# Patient Record
Sex: Female | Born: 1970
Health system: Southern US, Community
[De-identification: ages and names within clinical notes are randomized; demographics above are authoritative.]

## PROBLEM LIST (undated history)

## (undated) DIAGNOSIS — E039 Hypothyroidism, unspecified: Secondary | ICD-10-CM

## (undated) DIAGNOSIS — T7840XA Allergy, unspecified, initial encounter: Secondary | ICD-10-CM

## (undated) DIAGNOSIS — F319 Bipolar disorder, unspecified: Secondary | ICD-10-CM

## (undated) DIAGNOSIS — F419 Anxiety disorder, unspecified: Secondary | ICD-10-CM

## (undated) DIAGNOSIS — E1129 Type 2 diabetes mellitus with other diabetic kidney complication: Secondary | ICD-10-CM

## (undated) DIAGNOSIS — Z800 Family history of malignant neoplasm of digestive organs: Secondary | ICD-10-CM

## (undated) DIAGNOSIS — E785 Hyperlipidemia, unspecified: Secondary | ICD-10-CM

## (undated) DIAGNOSIS — C801 Malignant (primary) neoplasm, unspecified: Secondary | ICD-10-CM

## (undated) DIAGNOSIS — F410 Panic disorder [episodic paroxysmal anxiety] without agoraphobia: Secondary | ICD-10-CM

## (undated) DIAGNOSIS — F329 Major depressive disorder, single episode, unspecified: Secondary | ICD-10-CM

## (undated) DIAGNOSIS — I10 Essential (primary) hypertension: Secondary | ICD-10-CM

## (undated) DIAGNOSIS — Z803 Family history of malignant neoplasm of breast: Secondary | ICD-10-CM

## (undated) DIAGNOSIS — Z87442 Personal history of urinary calculi: Secondary | ICD-10-CM

## (undated) DIAGNOSIS — Z8049 Family history of malignant neoplasm of other genital organs: Secondary | ICD-10-CM

## (undated) DIAGNOSIS — N189 Chronic kidney disease, unspecified: Secondary | ICD-10-CM

## (undated) DIAGNOSIS — F32A Depression, unspecified: Secondary | ICD-10-CM

## (undated) DIAGNOSIS — M1990 Unspecified osteoarthritis, unspecified site: Secondary | ICD-10-CM

## (undated) DIAGNOSIS — K219 Gastro-esophageal reflux disease without esophagitis: Secondary | ICD-10-CM

## (undated) DIAGNOSIS — J449 Chronic obstructive pulmonary disease, unspecified: Secondary | ICD-10-CM

## (undated) DIAGNOSIS — J189 Pneumonia, unspecified organism: Secondary | ICD-10-CM

## (undated) DIAGNOSIS — L409 Psoriasis, unspecified: Secondary | ICD-10-CM

## (undated) DIAGNOSIS — Z8489 Family history of other specified conditions: Secondary | ICD-10-CM

## (undated) HISTORY — DX: Essential (primary) hypertension: I10

## (undated) HISTORY — PX: MOUTH SURGERY: SHX715

## (undated) HISTORY — DX: Family history of malignant neoplasm of breast: Z80.3

## (undated) HISTORY — DX: Chronic kidney disease, unspecified: N18.9

## (undated) HISTORY — DX: Allergy, unspecified, initial encounter: T78.40XA

## (undated) HISTORY — DX: Hyperlipidemia, unspecified: E78.5

## (undated) HISTORY — DX: Major depressive disorder, single episode, unspecified: F32.9

## (undated) HISTORY — DX: Anxiety disorder, unspecified: F41.9

## (undated) HISTORY — DX: Family history of malignant neoplasm of other genital organs: Z80.49

## (undated) HISTORY — DX: Family history of malignant neoplasm of digestive organs: Z80.0

## (undated) HISTORY — DX: Gastro-esophageal reflux disease without esophagitis: K21.9

## (undated) HISTORY — DX: Depression, unspecified: F32.A

## (undated) MED FILL — Dexamethasone Sodium Phosphate Inj 100 MG/10ML: INTRAMUSCULAR | Qty: 1 | Status: AC

---
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
## 2004-12-06 ENCOUNTER — Ambulatory Visit: Payer: Self-pay | Admitting: Obstetrics and Gynecology

## 2004-12-25 ENCOUNTER — Ambulatory Visit: Payer: Self-pay | Admitting: Obstetrics and Gynecology

## 2005-01-25 ENCOUNTER — Ambulatory Visit: Payer: Self-pay | Admitting: Obstetrics and Gynecology

## 2005-06-19 ENCOUNTER — Inpatient Hospital Stay: Payer: Self-pay | Admitting: Obstetrics and Gynecology

## 2006-09-22 ENCOUNTER — Emergency Department: Payer: Self-pay | Admitting: Unknown Physician Specialty

## 2006-09-25 ENCOUNTER — Other Ambulatory Visit: Payer: Self-pay

## 2006-09-25 ENCOUNTER — Emergency Department: Payer: Self-pay | Admitting: General Practice

## 2007-01-24 ENCOUNTER — Emergency Department: Payer: Self-pay | Admitting: Internal Medicine

## 2009-12-31 ENCOUNTER — Inpatient Hospital Stay: Payer: Self-pay | Admitting: Psychiatry

## 2010-05-25 ENCOUNTER — Inpatient Hospital Stay: Payer: Self-pay | Admitting: Psychiatry

## 2011-05-21 ENCOUNTER — Inpatient Hospital Stay: Payer: Self-pay | Admitting: Psychiatry

## 2011-09-18 ENCOUNTER — Other Ambulatory Visit: Payer: Self-pay | Admitting: Psychiatry

## 2012-01-04 ENCOUNTER — Inpatient Hospital Stay: Payer: Self-pay | Admitting: Psychiatry

## 2012-01-04 LAB — TSH: Thyroid Stimulating Horm: 2.22 u[IU]/mL

## 2012-01-04 LAB — COMPREHENSIVE METABOLIC PANEL
Albumin: 4.5 g/dL (ref 3.4–5.0)
Alkaline Phosphatase: 89 U/L (ref 50–136)
Anion Gap: 10 (ref 7–16)
BUN: 16 mg/dL (ref 7–18)
Bilirubin,Total: 0.4 mg/dL (ref 0.2–1.0)
Calcium, Total: 10 mg/dL (ref 8.5–10.1)
Creatinine: 1.05 mg/dL (ref 0.60–1.30)
EGFR (Non-African Amer.): >60
Glucose: 92 mg/dL (ref 65–99)
Potassium: 3.4 mmol/L — ABNORMAL LOW (ref 3.5–5.1)
SGOT(AST): 19 U/L (ref 15–37)
SGPT (ALT): 16 U/L
Sodium: 135 mmol/L — ABNORMAL LOW (ref 136–145)
Total Protein: 8.4 g/dL — ABNORMAL HIGH (ref 6.4–8.2)

## 2012-01-04 LAB — DRUG SCREEN, URINE
Amphetamines, Ur Screen: NEGATIVE (ref ?–1000)
Benzodiazepine, Ur Scrn: NEGATIVE (ref ?–200)
Cannabinoid 50 Ng, Ur ~~LOC~~: NEGATIVE (ref ?–50)
MDMA (Ecstasy)Ur Screen: NEGATIVE (ref ?–500)
Methadone, Ur Screen: NEGATIVE (ref ?–300)

## 2012-01-04 LAB — URINALYSIS, COMPLETE
Bilirubin,UR: NEGATIVE
Glucose,UR: NEGATIVE mg/dL (ref 0–75)
Leukocyte Esterase: NEGATIVE
Nitrite: NEGATIVE
Ph: 6 (ref 4.5–8.0)
RBC,UR: 2016 /HPF (ref 0–5)
Squamous Epithelial: 3

## 2012-01-04 LAB — CBC
HCT: 46.3 % (ref 35.0–47.0)
HGB: 15.8 g/dL (ref 12.0–16.0)
MCHC: 34.2 g/dL (ref 32.0–36.0)
RBC: 4.77 10*6/uL (ref 3.80–5.20)

## 2012-01-06 LAB — BASIC METABOLIC PANEL
Anion Gap: 8 (ref 7–16)
BUN: 15 mg/dL (ref 7–18)
Calcium, Total: 10 mg/dL (ref 8.5–10.1)
Creatinine: 0.92 mg/dL (ref 0.60–1.30)
EGFR (African American): >60
EGFR (Non-African Amer.): >60
Glucose: 115 mg/dL — ABNORMAL HIGH (ref 65–99)
Osmolality: 272 (ref 275–301)
Sodium: 135 mmol/L — ABNORMAL LOW (ref 136–145)

## 2012-01-06 LAB — CBC WITH DIFFERENTIAL/PLATELET
Basophil #: 0.1 10*3/uL (ref 0.0–0.1)
Basophil %: 0.5 %
Eosinophil %: 1.9 %
HCT: 49.1 % — ABNORMAL HIGH (ref 35.0–47.0)
Lymphocyte #: 3.8 10*3/uL — ABNORMAL HIGH (ref 1.0–3.6)
Lymphocyte %: 33.9 %
MCV: 98 fL (ref 80–100)
Monocyte %: 7.3 %
Neutrophil %: 56.4 %
Platelet: 209 10*3/uL (ref 150–440)
RDW: 12.9 % (ref 11.5–14.5)
WBC: 11.3 10*3/uL — ABNORMAL HIGH (ref 3.6–11.0)

## 2012-01-06 LAB — URINALYSIS, COMPLETE
Bilirubin,UR: NEGATIVE
Glucose,UR: NEGATIVE mg/dL (ref 0–75)
Ketone: NEGATIVE
Nitrite: NEGATIVE
RBC,UR: 2652 /HPF (ref 0–5)
Specific Gravity: 1.01 (ref 1.003–1.030)
Squamous Epithelial: 8
WBC UR: 42 /HPF (ref 0–5)

## 2012-01-06 LAB — LITHIUM LEVEL: Lithium: 0.4 mmol/L — ABNORMAL LOW

## 2012-01-08 LAB — URINALYSIS, COMPLETE
Bacteria: NONE SEEN
Bilirubin,UR: NEGATIVE
Glucose,UR: NEGATIVE mg/dL (ref 0–75)
Ketone: NEGATIVE
Ph: 7 (ref 4.5–8.0)
Protein: NEGATIVE
Specific Gravity: 1.005 (ref 1.003–1.030)
WBC UR: 1 /HPF (ref 0–5)

## 2012-01-10 LAB — LITHIUM LEVEL: Lithium: 0.46 mmol/L — ABNORMAL LOW

## 2012-01-17 ENCOUNTER — Inpatient Hospital Stay: Payer: Self-pay | Admitting: Psychiatry

## 2012-01-17 LAB — BEHAVIORAL MEDICINE 1 PANEL
Alkaline Phosphatase: 78 U/L (ref 50–136)
Anion Gap: 9 (ref 7–16)
BUN: 9 mg/dL (ref 7–18)
Basophil #: 0.1 10*3/uL (ref 0.0–0.1)
Bilirubin,Total: 0.2 mg/dL (ref 0.2–1.0)
Chloride: 104 mmol/L (ref 98–107)
Co2: 28 mmol/L (ref 21–32)
Creatinine: 0.89 mg/dL (ref 0.60–1.30)
EGFR (African American): >60
EGFR (Non-African Amer.): >60
Eosinophil #: 0.3 10*3/uL (ref 0.0–0.7)
HCT: 41.8 % (ref 35.0–47.0)
HGB: 14.4 g/dL (ref 12.0–16.0)
Lymphocyte #: 5.3 10*3/uL — ABNORMAL HIGH (ref 1.0–3.6)
Lymphocyte %: 33.5 %
MCH: 33.3 pg (ref 26.0–34.0)
MCHC: 34.6 g/dL (ref 32.0–36.0)
Monocyte #: 0.8 10*3/uL — ABNORMAL HIGH (ref 0.0–0.7)
Neutrophil %: 58.8 %
Osmolality: 280 (ref 275–301)
Platelet: 237 10*3/uL (ref 150–440)
SGOT(AST): 16 U/L (ref 15–37)
SGPT (ALT): 17 U/L
Sodium: 141 mmol/L (ref 136–145)
Thyroid Stimulating Horm: 1.73 u[IU]/mL
Total Protein: 7.2 g/dL (ref 6.4–8.2)

## 2012-01-17 LAB — LITHIUM LEVEL: Lithium: 0.83 mmol/L

## 2012-01-18 LAB — URINALYSIS, COMPLETE
Glucose,UR: NEGATIVE mg/dL (ref 0–75)
Ketone: NEGATIVE
Leukocyte Esterase: NEGATIVE
Nitrite: NEGATIVE
Ph: 7 (ref 4.5–8.0)
Protein: NEGATIVE
RBC,UR: 12 /HPF (ref 0–5)
Squamous Epithelial: NONE SEEN
WBC UR: 1 /HPF (ref 0–5)

## 2012-04-15 ENCOUNTER — Inpatient Hospital Stay: Payer: Self-pay | Admitting: Psychiatry

## 2012-04-15 LAB — URINALYSIS, COMPLETE
Glucose,UR: NEGATIVE mg/dL (ref 0–75)
Ketone: NEGATIVE
Ph: 7 (ref 4.5–8.0)
Protein: NEGATIVE
Specific Gravity: 1.004 (ref 1.003–1.030)
WBC UR: 68 /HPF (ref 0–5)

## 2012-04-15 LAB — COMPREHENSIVE METABOLIC PANEL
Albumin: 4 g/dL (ref 3.4–5.0)
Alkaline Phosphatase: 96 U/L (ref 50–136)
Anion Gap: 8 (ref 7–16)
BUN: 6 mg/dL — ABNORMAL LOW (ref 7–18)
Bilirubin,Total: 0.2 mg/dL (ref 0.2–1.0)
Calcium, Total: 9.2 mg/dL (ref 8.5–10.1)
Chloride: 107 mmol/L (ref 98–107)
Co2: 25 mmol/L (ref 21–32)
EGFR (African American): >60
SGOT(AST): 16 U/L (ref 15–37)
SGPT (ALT): 17 U/L
Sodium: 140 mmol/L (ref 136–145)
Total Protein: 7.4 g/dL (ref 6.4–8.2)

## 2012-04-15 LAB — CBC
HGB: 14.7 g/dL (ref 12.0–16.0)
MCHC: 33.6 g/dL (ref 32.0–36.0)
MCV: 97 fL (ref 80–100)
WBC: 10.3 10*3/uL (ref 3.6–11.0)

## 2012-04-15 LAB — ETHANOL
Ethanol %: <0.003 % (ref 0.000–0.080)
Ethanol: <3 mg/dL

## 2012-04-15 LAB — DRUG SCREEN, URINE
Amphetamines, Ur Screen: NEGATIVE (ref ?–1000)
Barbiturates, Ur Screen: NEGATIVE (ref ?–200)
Cannabinoid 50 Ng, Ur ~~LOC~~: NEGATIVE (ref ?–50)
Cocaine Metabolite,Ur ~~LOC~~: NEGATIVE (ref ?–300)
MDMA (Ecstasy)Ur Screen: NEGATIVE (ref ?–500)
Methadone, Ur Screen: NEGATIVE (ref ?–300)
Opiate, Ur Screen: NEGATIVE (ref ?–300)
Phencyclidine (PCP) Ur S: NEGATIVE (ref ?–25)
Tricyclic, Ur Screen: NEGATIVE (ref ?–1000)

## 2012-04-15 LAB — PREGNANCY, URINE: Pregnancy Test, Urine: NEGATIVE m[IU]/mL

## 2012-04-15 LAB — ACETAMINOPHEN LEVEL: Acetaminophen: <2 ug/mL

## 2012-04-15 LAB — TSH: Thyroid Stimulating Horm: 1.48 u[IU]/mL

## 2012-04-15 LAB — LITHIUM LEVEL: Lithium: 0.41 mmol/L — ABNORMAL LOW

## 2012-04-17 LAB — URINE CULTURE

## 2012-04-19 LAB — URINALYSIS, COMPLETE
Bacteria: NONE SEEN
Blood: NEGATIVE
Nitrite: NEGATIVE
Protein: NEGATIVE
RBC,UR: 1 /HPF (ref 0–5)
Specific Gravity: 1.006 (ref 1.003–1.030)
Squamous Epithelial: 1
WBC UR: 3 /HPF (ref 0–5)

## 2012-09-18 ENCOUNTER — Ambulatory Visit: Payer: Self-pay | Admitting: Internal Medicine

## 2012-11-18 ENCOUNTER — Other Ambulatory Visit: Payer: Self-pay | Admitting: Psychiatry

## 2013-03-27 ENCOUNTER — Ambulatory Visit: Payer: Self-pay | Admitting: Obstetrics and Gynecology

## 2013-04-01 ENCOUNTER — Other Ambulatory Visit: Payer: Self-pay | Admitting: Obstetrics and Gynecology

## 2013-04-08 ENCOUNTER — Ambulatory Visit: Payer: Self-pay | Admitting: Obstetrics and Gynecology

## 2014-03-24 ENCOUNTER — Emergency Department: Payer: Self-pay | Admitting: Emergency Medicine

## 2014-03-24 LAB — CBC
HCT: 46.3 % (ref 35.0–47.0)
HGB: 15.5 g/dL (ref 12.0–16.0)
MCH: 32.8 pg (ref 26.0–34.0)
MCHC: 33.4 g/dL (ref 32.0–36.0)
MCV: 98 fL (ref 80–100)
PLATELETS: 213 10*3/uL (ref 150–440)
RBC: 4.72 10*6/uL (ref 3.80–5.20)
RDW: 13.2 % (ref 11.5–14.5)
WBC: 14 10*3/uL — ABNORMAL HIGH (ref 3.6–11.0)

## 2014-03-24 LAB — COMPREHENSIVE METABOLIC PANEL
ALBUMIN: 4.1 g/dL (ref 3.4–5.0)
Alkaline Phosphatase: 91 U/L
Anion Gap: 2 — ABNORMAL LOW (ref 7–16)
BILIRUBIN TOTAL: 0.4 mg/dL (ref 0.2–1.0)
BUN: 7 mg/dL (ref 7–18)
CALCIUM: 9.3 mg/dL (ref 8.5–10.1)
CO2: 29 mmol/L (ref 21–32)
Chloride: 107 mmol/L (ref 98–107)
Creatinine: 1.04 mg/dL (ref 0.60–1.30)
EGFR (African American): >60
EGFR (Non-African Amer.): >60
Glucose: 92 mg/dL (ref 65–99)
Osmolality: 273 (ref 275–301)
Potassium: 4.2 mmol/L (ref 3.5–5.1)
SGOT(AST): 18 U/L (ref 15–37)
SGPT (ALT): 13 U/L (ref 12–78)
Sodium: 138 mmol/L (ref 136–145)
Total Protein: 7.7 g/dL (ref 6.4–8.2)

## 2014-03-24 LAB — URINALYSIS, COMPLETE
Bacteria: NONE SEEN
Bilirubin,UR: NEGATIVE
Glucose,UR: NEGATIVE mg/dL (ref 0–75)
Ketone: NEGATIVE
NITRITE: NEGATIVE
PH: 6 (ref 4.5–8.0)
Protein: 30
RBC,UR: 22 /HPF (ref 0–5)
Specific Gravity: 1.01 (ref 1.003–1.030)
WBC UR: 14 /HPF (ref 0–5)

## 2014-03-24 LAB — LITHIUM LEVEL: Lithium: 0.58 mmol/L — ABNORMAL LOW

## 2014-03-24 LAB — LIPASE, BLOOD: Lipase: 174 U/L (ref 73–393)

## 2014-05-21 ENCOUNTER — Emergency Department: Payer: Self-pay | Admitting: Emergency Medicine

## 2014-05-21 LAB — COMPREHENSIVE METABOLIC PANEL
ALK PHOS: 111 U/L
ANION GAP: 7 (ref 7–16)
AST: 17 U/L (ref 15–37)
Albumin: 3.7 g/dL (ref 3.4–5.0)
BUN: 10 mg/dL (ref 7–18)
Bilirubin,Total: 0.3 mg/dL (ref 0.2–1.0)
CO2: 25 mmol/L (ref 21–32)
Calcium, Total: 9.4 mg/dL (ref 8.5–10.1)
Chloride: 106 mmol/L (ref 98–107)
Creatinine: 0.77 mg/dL (ref 0.60–1.30)
Glucose: 121 mg/dL — ABNORMAL HIGH (ref 65–99)
Osmolality: 276 (ref 275–301)
Potassium: 4.2 mmol/L (ref 3.5–5.1)
SGPT (ALT): 25 U/L (ref 12–78)
Sodium: 138 mmol/L (ref 136–145)
Total Protein: 7.2 g/dL (ref 6.4–8.2)

## 2014-05-21 LAB — ETHANOL: Ethanol %: <0.003 % (ref 0.000–0.080)

## 2014-05-21 LAB — URINALYSIS, COMPLETE
BACTERIA: NONE SEEN
Bilirubin,UR: NEGATIVE
GLUCOSE, UR: NEGATIVE mg/dL (ref 0–75)
Ketone: NEGATIVE
Nitrite: NEGATIVE
Ph: 7 (ref 4.5–8.0)
Protein: NEGATIVE
SPECIFIC GRAVITY: 1.011 (ref 1.003–1.030)
Squamous Epithelial: <1
WBC UR: 9 /HPF (ref 0–5)

## 2014-05-21 LAB — CBC
HCT: 45.2 % (ref 35.0–47.0)
HGB: 15.2 g/dL (ref 12.0–16.0)
MCH: 33.1 pg (ref 26.0–34.0)
MCHC: 33.6 g/dL (ref 32.0–36.0)
MCV: 99 fL (ref 80–100)
Platelet: 217 10*3/uL (ref 150–440)
RBC: 4.59 10*6/uL (ref 3.80–5.20)
RDW: 13 % (ref 11.5–14.5)
WBC: 16.5 10*3/uL — ABNORMAL HIGH (ref 3.6–11.0)

## 2014-05-21 LAB — DRUG SCREEN, URINE
AMPHETAMINES, UR SCREEN: NEGATIVE (ref ?–1000)
BARBITURATES, UR SCREEN: NEGATIVE (ref ?–200)
Benzodiazepine, Ur Scrn: NEGATIVE (ref ?–200)
COCAINE METABOLITE, UR ~~LOC~~: NEGATIVE (ref ?–300)
Cannabinoid 50 Ng, Ur ~~LOC~~: POSITIVE (ref ?–50)
MDMA (Ecstasy)Ur Screen: NEGATIVE (ref ?–500)
Methadone, Ur Screen: NEGATIVE (ref ?–300)
Opiate, Ur Screen: NEGATIVE (ref ?–300)
PHENCYCLIDINE (PCP) UR S: NEGATIVE (ref ?–25)
Tricyclic, Ur Screen: NEGATIVE (ref ?–1000)

## 2014-05-21 LAB — LIPASE, BLOOD: Lipase: 298 U/L (ref 73–393)

## 2014-05-25 ENCOUNTER — Inpatient Hospital Stay: Payer: Self-pay | Admitting: Psychiatry

## 2014-05-25 LAB — URINALYSIS, COMPLETE
BACTERIA: NONE SEEN
BLOOD: NEGATIVE
Bilirubin,UR: NEGATIVE
GLUCOSE, UR: NEGATIVE mg/dL (ref 0–75)
Nitrite: NEGATIVE
PH: 6 (ref 4.5–8.0)
Specific Gravity: 1.02 (ref 1.003–1.030)
Squamous Epithelial: 5
WBC UR: 12 /HPF (ref 0–5)

## 2014-05-25 LAB — CBC
HCT: 50.4 % — ABNORMAL HIGH (ref 35.0–47.0)
HGB: 17 g/dL — AB (ref 12.0–16.0)
MCH: 33 pg (ref 26.0–34.0)
MCHC: 33.7 g/dL (ref 32.0–36.0)
MCV: 98 fL (ref 80–100)
Platelet: 268 10*3/uL (ref 150–440)
RBC: 5.14 10*6/uL (ref 3.80–5.20)
RDW: 12.8 % (ref 11.5–14.5)
WBC: 13.5 10*3/uL — AB (ref 3.6–11.0)

## 2014-05-25 LAB — DRUG SCREEN, URINE
Amphetamines, Ur Screen: NEGATIVE (ref ?–1000)
BARBITURATES, UR SCREEN: NEGATIVE (ref ?–200)
BENZODIAZEPINE, UR SCRN: NEGATIVE (ref ?–200)
Cannabinoid 50 Ng, Ur ~~LOC~~: POSITIVE (ref ?–50)
Cocaine Metabolite,Ur ~~LOC~~: NEGATIVE (ref ?–300)
MDMA (ECSTASY) UR SCREEN: NEGATIVE (ref ?–500)
Methadone, Ur Screen: NEGATIVE (ref ?–300)
OPIATE, UR SCREEN: NEGATIVE (ref ?–300)
Phencyclidine (PCP) Ur S: NEGATIVE (ref ?–25)
TRICYCLIC, UR SCREEN: NEGATIVE (ref ?–1000)

## 2014-05-25 LAB — COMPREHENSIVE METABOLIC PANEL
ALT: 17 U/L (ref 12–78)
AST: 15 U/L (ref 15–37)
Albumin: 4.4 g/dL (ref 3.4–5.0)
Alkaline Phosphatase: 110 U/L
Anion Gap: 8 (ref 7–16)
BILIRUBIN TOTAL: 0.5 mg/dL (ref 0.2–1.0)
BUN: 10 mg/dL (ref 7–18)
Calcium, Total: 9.9 mg/dL (ref 8.5–10.1)
Chloride: 103 mmol/L (ref 98–107)
Co2: 24 mmol/L (ref 21–32)
Creatinine: 0.91 mg/dL (ref 0.60–1.30)
EGFR (African American): >60
EGFR (Non-African Amer.): >60
GLUCOSE: 114 mg/dL — AB (ref 65–99)
OSMOLALITY: 270 (ref 275–301)
Potassium: 3.7 mmol/L (ref 3.5–5.1)
Sodium: 135 mmol/L — ABNORMAL LOW (ref 136–145)
Total Protein: 7.8 g/dL (ref 6.4–8.2)

## 2014-05-25 LAB — LITHIUM LEVEL: Lithium: 1.76 mmol/L

## 2014-05-25 LAB — SALICYLATE LEVEL: Salicylates, Serum: 5.4 mg/dL — ABNORMAL HIGH

## 2014-05-25 LAB — ETHANOL
Ethanol %: <0.003 % (ref 0.000–0.080)
Ethanol: <3 mg/dL

## 2014-05-25 LAB — ACETAMINOPHEN LEVEL: Acetaminophen: <2 ug/mL

## 2014-05-27 LAB — LITHIUM LEVEL: LITHIUM: 0.64 mmol/L

## 2014-05-28 LAB — CBC WITH DIFFERENTIAL/PLATELET
BASOS ABS: 0.1 10*3/uL (ref 0.0–0.1)
Basophil %: 0.4 %
EOS ABS: 0.6 10*3/uL (ref 0.0–0.7)
Eosinophil %: 3.5 %
HCT: 47.8 % — ABNORMAL HIGH (ref 35.0–47.0)
HGB: 15.8 g/dL (ref 12.0–16.0)
LYMPHS ABS: 0.9 10*3/uL — AB (ref 1.0–3.6)
Lymphocyte %: 5.4 %
MCH: 32.8 pg (ref 26.0–34.0)
MCHC: 33 g/dL (ref 32.0–36.0)
MCV: 99 fL (ref 80–100)
MONOS PCT: 8.1 %
Monocyte #: 1.3 x10 3/mm — ABNORMAL HIGH (ref 0.2–0.9)
NEUTROS ABS: 13.2 10*3/uL — AB (ref 1.4–6.5)
Neutrophil %: 82.6 %
Platelet: 209 10*3/uL (ref 150–440)
RBC: 4.82 10*6/uL (ref 3.80–5.20)
RDW: 13.1 % (ref 11.5–14.5)
WBC: 16 10*3/uL — ABNORMAL HIGH (ref 3.6–11.0)

## 2014-05-28 LAB — BASIC METABOLIC PANEL
Anion Gap: 3 — ABNORMAL LOW (ref 7–16)
BUN: 11 mg/dL (ref 7–18)
Calcium, Total: 9.5 mg/dL (ref 8.5–10.1)
Chloride: 106 mmol/L (ref 98–107)
Co2: 27 mmol/L (ref 21–32)
Creatinine: 1.04 mg/dL (ref 0.60–1.30)
EGFR (Non-African Amer.): >60
Glucose: 104 mg/dL — ABNORMAL HIGH (ref 65–99)
OSMOLALITY: 272 (ref 275–301)
POTASSIUM: 4.3 mmol/L (ref 3.5–5.1)
SODIUM: 136 mmol/L (ref 136–145)

## 2014-05-28 LAB — HCG, QUANTITATIVE, PREGNANCY

## 2014-05-29 LAB — CBC WITH DIFFERENTIAL/PLATELET
BASOS ABS: 0.1 10*3/uL (ref 0.0–0.1)
BASOS PCT: 0.3 %
EOS ABS: 0.6 10*3/uL (ref 0.0–0.7)
EOS PCT: 3.7 %
HCT: 46.9 % (ref 35.0–47.0)
HGB: 15.5 g/dL (ref 12.0–16.0)
LYMPHS ABS: 0.8 10*3/uL — AB (ref 1.0–3.6)
LYMPHS PCT: 5 %
MCH: 32.7 pg (ref 26.0–34.0)
MCHC: 33.1 g/dL (ref 32.0–36.0)
MCV: 99 fL (ref 80–100)
Monocyte #: 1.5 x10 3/mm — ABNORMAL HIGH (ref 0.2–0.9)
Monocyte %: 9.3 %
NEUTROS ABS: 12.7 10*3/uL — AB (ref 1.4–6.5)
NEUTROS PCT: 81.7 %
PLATELETS: 183 10*3/uL (ref 150–440)
RBC: 4.74 10*6/uL (ref 3.80–5.20)
RDW: 13.1 % (ref 11.5–14.5)
WBC: 15.6 10*3/uL — ABNORMAL HIGH (ref 3.6–11.0)

## 2014-05-29 LAB — SEDIMENTATION RATE: ERYTHROCYTE SED RATE: 22 mm/h — AB (ref 0–20)

## 2014-05-30 LAB — CBC WITH DIFFERENTIAL/PLATELET
BASOS PCT: 0.7 %
Basophil #: 0.1 10*3/uL (ref 0.0–0.1)
Eosinophil #: 0.5 10*3/uL (ref 0.0–0.7)
Eosinophil %: 5.6 %
HCT: 44.5 % (ref 35.0–47.0)
HGB: 14.7 g/dL (ref 12.0–16.0)
LYMPHS ABS: 1.2 10*3/uL (ref 1.0–3.6)
LYMPHS PCT: 12.6 %
MCH: 32.4 pg (ref 26.0–34.0)
MCHC: 33 g/dL (ref 32.0–36.0)
MCV: 98 fL (ref 80–100)
MONO ABS: 1.1 x10 3/mm — AB (ref 0.2–0.9)
MONOS PCT: 11.8 %
Neutrophil #: 6.5 10*3/uL (ref 1.4–6.5)
Neutrophil %: 69.3 %
PLATELETS: 186 10*3/uL (ref 150–440)
RBC: 4.53 10*6/uL (ref 3.80–5.20)
RDW: 12.5 % (ref 11.5–14.5)
WBC: 9.4 10*3/uL (ref 3.6–11.0)

## 2014-06-02 LAB — LITHIUM LEVEL: Lithium: 0.86 mmol/L

## 2014-06-02 LAB — TSH: THYROID STIMULATING HORM: 14.1 u[IU]/mL — AB

## 2014-07-19 LAB — COMPREHENSIVE METABOLIC PANEL
ALT: 18 U/L
Albumin: 4.3 g/dL (ref 3.4–5.0)
Alkaline Phosphatase: 83 U/L
Anion Gap: 8 (ref 7–16)
BUN: 6 mg/dL — ABNORMAL LOW (ref 7–18)
Bilirubin,Total: 0.4 mg/dL (ref 0.2–1.0)
CALCIUM: 10 mg/dL (ref 8.5–10.1)
Chloride: 101 mmol/L (ref 98–107)
Co2: 27 mmol/L (ref 21–32)
Creatinine: 0.96 mg/dL (ref 0.60–1.30)
EGFR (African American): >60
EGFR (Non-African Amer.): >60
Glucose: 108 mg/dL — ABNORMAL HIGH (ref 65–99)
OSMOLALITY: 270 (ref 275–301)
Potassium: 3.6 mmol/L (ref 3.5–5.1)
SGOT(AST): 15 U/L (ref 15–37)
SODIUM: 136 mmol/L (ref 136–145)
TOTAL PROTEIN: 7.9 g/dL (ref 6.4–8.2)

## 2014-07-19 LAB — URINALYSIS, COMPLETE
BILIRUBIN, UR: NEGATIVE
Glucose,UR: NEGATIVE mg/dL (ref 0–75)
KETONE: NEGATIVE
NITRITE: NEGATIVE
Ph: 7 (ref 4.5–8.0)
Protein: 30
RBC,UR: <1 /HPF (ref 0–5)
SPECIFIC GRAVITY: 1.004 (ref 1.003–1.030)
WBC UR: 4 /HPF (ref 0–5)

## 2014-07-19 LAB — SALICYLATE LEVEL: Salicylates, Serum: 4.1 mg/dL — ABNORMAL HIGH

## 2014-07-19 LAB — CBC
HCT: 45 % (ref 35.0–47.0)
HGB: 15.2 g/dL (ref 12.0–16.0)
MCH: 34 pg (ref 26.0–34.0)
MCHC: 33.8 g/dL (ref 32.0–36.0)
MCV: 101 fL — ABNORMAL HIGH (ref 80–100)
Platelet: 240 10*3/uL (ref 150–440)
RBC: 4.47 10*6/uL (ref 3.80–5.20)
RDW: 13.6 % (ref 11.5–14.5)
WBC: 22.3 10*3/uL — ABNORMAL HIGH (ref 3.6–11.0)

## 2014-07-19 LAB — DRUG SCREEN, URINE
Amphetamines, Ur Screen: NEGATIVE (ref ?–1000)
Barbiturates, Ur Screen: NEGATIVE (ref ?–200)
Benzodiazepine, Ur Scrn: NEGATIVE (ref ?–200)
CANNABINOID 50 NG, UR ~~LOC~~: POSITIVE (ref ?–50)
Cocaine Metabolite,Ur ~~LOC~~: NEGATIVE (ref ?–300)
MDMA (Ecstasy)Ur Screen: NEGATIVE (ref ?–500)
METHADONE, UR SCREEN: NEGATIVE (ref ?–300)
OPIATE, UR SCREEN: NEGATIVE (ref ?–300)
Phencyclidine (PCP) Ur S: NEGATIVE (ref ?–25)
Tricyclic, Ur Screen: NEGATIVE (ref ?–1000)

## 2014-07-19 LAB — ACETAMINOPHEN LEVEL: Acetaminophen: <2 ug/mL

## 2014-07-19 LAB — ETHANOL: Ethanol: <3 mg/dL

## 2014-07-19 LAB — LITHIUM LEVEL: Lithium: 0.88 mmol/L

## 2014-07-20 ENCOUNTER — Inpatient Hospital Stay: Payer: Self-pay | Admitting: Psychiatry

## 2014-07-23 LAB — LIPID PANEL
CHOLESTEROL: 155 mg/dL (ref 0–200)
HDL Cholesterol: 44 mg/dL (ref 40–60)
Ldl Cholesterol, Calc: 85 mg/dL (ref 0–100)
TRIGLYCERIDES: 131 mg/dL (ref 0–200)
VLDL CHOLESTEROL, CALC: 26 mg/dL (ref 5–40)

## 2014-07-23 LAB — HEMOGLOBIN A1C: HEMOGLOBIN A1C: 4.9 % (ref 4.2–6.3)

## 2014-07-23 LAB — TSH: Thyroid Stimulating Horm: 1.67 u[IU]/mL

## 2014-07-30 LAB — LITHIUM LEVEL: Lithium: 0.71 mmol/L

## 2014-11-05 ENCOUNTER — Ambulatory Visit: Payer: Self-pay | Admitting: Urology

## 2014-12-29 ENCOUNTER — Inpatient Hospital Stay: Payer: Self-pay | Admitting: Psychiatry

## 2014-12-29 LAB — URINALYSIS, COMPLETE
Bacteria: NONE SEEN
Bilirubin,UR: NEGATIVE
Blood: NEGATIVE
GLUCOSE, UR: NEGATIVE mg/dL (ref 0–75)
Ketone: NEGATIVE
NITRITE: NEGATIVE
Ph: 7 (ref 4.5–8.0)
Protein: NEGATIVE
RBC,UR: 2 /HPF (ref 0–5)
Specific Gravity: 1.009 (ref 1.003–1.030)
Squamous Epithelial: 1

## 2014-12-29 LAB — COMPREHENSIVE METABOLIC PANEL
ALK PHOS: 97 U/L
AST: 26 U/L (ref 15–37)
Albumin: 4.3 g/dL (ref 3.4–5.0)
Anion Gap: 9 (ref 7–16)
BILIRUBIN TOTAL: 0.4 mg/dL (ref 0.2–1.0)
BUN: 12 mg/dL (ref 7–18)
CREATININE: 1 mg/dL (ref 0.60–1.30)
Calcium, Total: 8.9 mg/dL (ref 8.5–10.1)
Chloride: 107 mmol/L (ref 98–107)
Co2: 27 mmol/L (ref 21–32)
GLUCOSE: 127 mg/dL — AB (ref 65–99)
OSMOLALITY: 286 (ref 275–301)
Potassium: 3.9 mmol/L (ref 3.5–5.1)
SGPT (ALT): 25 U/L
Sodium: 143 mmol/L (ref 136–145)
Total Protein: 7.8 g/dL (ref 6.4–8.2)

## 2014-12-29 LAB — CBC
HCT: 48.3 % — AB (ref 35.0–47.0)
HGB: 15.8 g/dL (ref 12.0–16.0)
MCH: 32.5 pg (ref 26.0–34.0)
MCHC: 32.7 g/dL (ref 32.0–36.0)
MCV: 99 fL (ref 80–100)
Platelet: 259 10*3/uL (ref 150–440)
RBC: 4.86 10*6/uL (ref 3.80–5.20)
RDW: 13 % (ref 11.5–14.5)
WBC: 15.3 10*3/uL — AB (ref 3.6–11.0)

## 2014-12-29 LAB — ETHANOL

## 2014-12-29 LAB — SALICYLATE LEVEL: Salicylates, Serum: 1.8 mg/dL

## 2014-12-29 LAB — DRUG SCREEN, URINE
AMPHETAMINES, UR SCREEN: NEGATIVE (ref ?–1000)
BARBITURATES, UR SCREEN: NEGATIVE (ref ?–200)
Benzodiazepine, Ur Scrn: NEGATIVE (ref ?–200)
Cannabinoid 50 Ng, Ur ~~LOC~~: POSITIVE (ref ?–50)
Cocaine Metabolite,Ur ~~LOC~~: NEGATIVE (ref ?–300)
MDMA (Ecstasy)Ur Screen: NEGATIVE (ref ?–500)
METHADONE, UR SCREEN: NEGATIVE (ref ?–300)
Opiate, Ur Screen: NEGATIVE (ref ?–300)
PHENCYCLIDINE (PCP) UR S: NEGATIVE (ref ?–25)
Tricyclic, Ur Screen: NEGATIVE (ref ?–1000)

## 2014-12-29 LAB — ACETAMINOPHEN LEVEL

## 2014-12-30 LAB — LITHIUM LEVEL: Lithium: <0.2 mmol/L — ABNORMAL LOW

## 2015-01-02 LAB — LITHIUM LEVEL: Lithium: 0.68 mmol/L

## 2015-01-08 LAB — HEMOGLOBIN A1C: HEMOGLOBIN A1C: 5.5 % (ref 4.2–6.3)

## 2015-01-08 LAB — LITHIUM LEVEL: Lithium: 1.02 mmol/L

## 2015-01-08 LAB — LIPID PANEL
CHOLESTEROL: 196 mg/dL (ref 0–200)
HDL: 58 mg/dL (ref 40–60)
Ldl Cholesterol, Calc: 96 mg/dL (ref 0–100)
TRIGLYCERIDES: 210 mg/dL — AB (ref 0–200)
VLDL CHOLESTEROL, CALC: 42 mg/dL — AB (ref 5–40)

## 2015-01-08 LAB — TSH: Thyroid Stimulating Horm: 2.29 u[IU]/mL

## 2015-01-12 LAB — LITHIUM LEVEL: LITHIUM: 1.19 mmol/L

## 2015-03-25 ENCOUNTER — Emergency Department: Admit: 2015-03-25 | Discharge status: Against Medical Advice | Payer: Self-pay | Admitting: Emergency Medicine

## 2015-03-25 DIAGNOSIS — Z8659 Personal history of other mental and behavioral disorders: Secondary | ICD-10-CM | POA: Diagnosis not present

## 2015-03-25 DIAGNOSIS — F319 Bipolar disorder, unspecified: Secondary | ICD-10-CM | POA: Diagnosis not present

## 2015-03-25 LAB — COMPREHENSIVE METABOLIC PANEL
ALK PHOS: 69 U/L
ALT: 9 U/L — AB
Albumin: 4.6 g/dL
Anion Gap: 11 (ref 7–16)
BUN: 19 mg/dL
Bilirubin,Total: 1.1 mg/dL
CO2: 24 mmol/L
CREATININE: 0.93 mg/dL
Calcium, Total: 9.8 mg/dL
Chloride: 102 mmol/L
EGFR (African American): >60
EGFR (Non-African Amer.): >60
GLUCOSE: 95 mg/dL
Potassium: 3.5 mmol/L
SGOT(AST): 18 U/L
Sodium: 137 mmol/L
TOTAL PROTEIN: 7.5 g/dL

## 2015-03-25 LAB — URINALYSIS, COMPLETE
Bilirubin,UR: NEGATIVE
Glucose,UR: NEGATIVE mg/dL (ref 0–75)
NITRITE: NEGATIVE
Ph: 6 (ref 4.5–8.0)
Protein: 30
RBC,UR: 10 /HPF (ref 0–5)
Specific Gravity: 1.014 (ref 1.003–1.030)
Squamous Epithelial: 14

## 2015-03-25 LAB — CBC
HCT: 39.2 % (ref 35.0–47.0)
HGB: 13.2 g/dL (ref 12.0–16.0)
MCH: 32.4 pg (ref 26.0–34.0)
MCHC: 33.6 g/dL (ref 32.0–36.0)
MCV: 96 fL (ref 80–100)
PLATELETS: 214 10*3/uL (ref 150–440)
RBC: 4.08 10*6/uL (ref 3.80–5.20)
RDW: 12.7 % (ref 11.5–14.5)
WBC: 12.6 10*3/uL — AB (ref 3.6–11.0)

## 2015-03-25 LAB — DRUG SCREEN, URINE
Amphetamines, Ur Screen: NEGATIVE
Barbiturates, Ur Screen: NEGATIVE
Benzodiazepine, Ur Scrn: NEGATIVE
COCAINE METABOLITE, UR ~~LOC~~: NEGATIVE
Cannabinoid 50 Ng, Ur ~~LOC~~: POSITIVE
MDMA (ECSTASY) UR SCREEN: NEGATIVE
Methadone, Ur Screen: NEGATIVE
Opiate, Ur Screen: NEGATIVE
PHENCYCLIDINE (PCP) UR S: NEGATIVE
TRICYCLIC, UR SCREEN: NEGATIVE

## 2015-03-25 LAB — ACETAMINOPHEN LEVEL: Acetaminophen: <10 ug/mL

## 2015-03-25 LAB — SALICYLATE LEVEL: Salicylates, Serum: <4 mg/dL

## 2015-03-25 LAB — ETHANOL

## 2015-03-26 LAB — LITHIUM LEVEL: LITHIUM: 1.13 mmol/L (ref 0.60–1.20)

## 2015-03-29 ENCOUNTER — Inpatient Hospital Stay
Admission: EM | Admit: 2015-03-29 | Discharge: 2015-04-28 | DRG: 885 | Disposition: A | Discharge status: Home / Self Care | Payer: 59 | Attending: Psychiatry | Admitting: Psychiatry

## 2015-03-29 DIAGNOSIS — S0990XA Unspecified injury of head, initial encounter: Secondary | ICD-10-CM | POA: Diagnosis present

## 2015-03-29 DIAGNOSIS — G253 Myoclonus: Secondary | ICD-10-CM | POA: Diagnosis present

## 2015-03-29 DIAGNOSIS — E7220 Disorder of urea cycle metabolism, unspecified: Secondary | ICD-10-CM | POA: Diagnosis present

## 2015-03-29 DIAGNOSIS — G4700 Insomnia, unspecified: Secondary | ICD-10-CM | POA: Diagnosis present

## 2015-03-29 DIAGNOSIS — E039 Hypothyroidism, unspecified: Secondary | ICD-10-CM | POA: Diagnosis present

## 2015-03-29 DIAGNOSIS — Z8659 Personal history of other mental and behavioral disorders: Secondary | ICD-10-CM

## 2015-03-29 DIAGNOSIS — T421X5A Adverse effect of iminostilbenes, initial encounter: Secondary | ICD-10-CM | POA: Diagnosis not present

## 2015-03-29 DIAGNOSIS — F17210 Nicotine dependence, cigarettes, uncomplicated: Secondary | ICD-10-CM | POA: Diagnosis present

## 2015-03-29 DIAGNOSIS — F05 Delirium due to known physiological condition: Secondary | ICD-10-CM | POA: Diagnosis present

## 2015-03-29 DIAGNOSIS — Z882 Allergy status to sulfonamides status: Secondary | ICD-10-CM

## 2015-03-29 DIAGNOSIS — W19XXXA Unspecified fall, initial encounter: Secondary | ICD-10-CM | POA: Diagnosis not present

## 2015-03-29 DIAGNOSIS — K5900 Constipation, unspecified: Secondary | ICD-10-CM | POA: Diagnosis present

## 2015-03-29 DIAGNOSIS — T43591A Poisoning by other antipsychotics and neuroleptics, accidental (unintentional), initial encounter: Secondary | ICD-10-CM | POA: Diagnosis present

## 2015-03-29 DIAGNOSIS — I951 Orthostatic hypotension: Secondary | ICD-10-CM | POA: Diagnosis present

## 2015-03-29 DIAGNOSIS — Z888 Allergy status to other drugs, medicaments and biological substances status: Secondary | ICD-10-CM | POA: Diagnosis not present

## 2015-03-29 DIAGNOSIS — F1010 Alcohol abuse, uncomplicated: Secondary | ICD-10-CM | POA: Diagnosis present

## 2015-03-29 DIAGNOSIS — F3110 Bipolar disorder, current episode manic without psychotic features, unspecified: Secondary | ICD-10-CM | POA: Diagnosis present

## 2015-03-29 DIAGNOSIS — F319 Bipolar disorder, unspecified: Principal | ICD-10-CM | POA: Diagnosis present

## 2015-03-29 DIAGNOSIS — Z9119 Patient's noncompliance with other medical treatment and regimen: Secondary | ICD-10-CM | POA: Diagnosis present

## 2015-03-29 DIAGNOSIS — F1290 Cannabis use, unspecified, uncomplicated: Secondary | ICD-10-CM | POA: Diagnosis present

## 2015-03-29 DIAGNOSIS — F1210 Cannabis abuse, uncomplicated: Secondary | ICD-10-CM | POA: Diagnosis present

## 2015-03-29 DIAGNOSIS — Z880 Allergy status to penicillin: Secondary | ICD-10-CM

## 2015-03-29 DIAGNOSIS — K219 Gastro-esophageal reflux disease without esophagitis: Secondary | ICD-10-CM | POA: Diagnosis present

## 2015-03-29 DIAGNOSIS — R410 Disorientation, unspecified: Secondary | ICD-10-CM | POA: Diagnosis present

## 2015-03-29 HISTORY — DX: Hypothyroidism, unspecified: E03.9

## 2015-03-30 LAB — LITHIUM LEVEL: Lithium: 1.25 mmol/L — ABNORMAL HIGH (ref 0.60–1.20)

## 2015-03-31 LAB — LITHIUM LEVEL: LITHIUM: 0.54 mmol/L — AB (ref 0.60–1.20)

## 2015-03-31 LAB — TSH: THYROID STIMULATING HORM: 2.03 u[IU]/mL

## 2015-04-05 LAB — RAPID HIV SCREEN (HIV 1/2 AB+AG)

## 2015-04-05 LAB — BASIC METABOLIC PANEL
ANION GAP: 6 — AB (ref 7–16)
BUN: 15 mg/dL
CALCIUM: 9.2 mg/dL
CO2: 25 mmol/L
Chloride: 105 mmol/L
Creatinine: 0.77 mg/dL
EGFR (African American): >60
Glucose: 111 mg/dL — ABNORMAL HIGH
Potassium: 4.5 mmol/L
Sodium: 136 mmol/L

## 2015-04-05 LAB — CBC WITH DIFFERENTIAL/PLATELET
BASOS ABS: 0.1 10*3/uL (ref 0.0–0.1)
Basophil %: 0.8 %
EOS ABS: 0.2 10*3/uL (ref 0.0–0.7)
Eosinophil %: 2.3 %
HCT: 38.1 % (ref 35.0–47.0)
HGB: 12.6 g/dL (ref 12.0–16.0)
Lymphocyte #: 2.6 10*3/uL (ref 1.0–3.6)
Lymphocyte %: 34.9 %
MCH: 32.2 pg (ref 26.0–34.0)
MCHC: 33 g/dL (ref 32.0–36.0)
MCV: 98 fL (ref 80–100)
Monocyte #: 0.5 x10 3/mm (ref 0.2–0.9)
Monocyte %: 6.5 %
Neutrophil #: 4.2 10*3/uL (ref 1.4–6.5)
Neutrophil %: 55.5 %
PLATELETS: 209 10*3/uL (ref 150–440)
RBC: 3.91 10*6/uL (ref 3.80–5.20)
RDW: 12.6 % (ref 11.5–14.5)
WBC: 7.6 10*3/uL (ref 3.6–11.0)

## 2015-04-05 LAB — AMMONIA: Ammonia, Plasma: 37 umol/L — ABNORMAL HIGH

## 2015-04-06 LAB — CBC WITH DIFFERENTIAL/PLATELET
Basophil #: 0.2 10*3/uL — ABNORMAL HIGH (ref 0.0–0.1)
Basophil %: 1.4 %
EOS ABS: 0.1 10*3/uL (ref 0.0–0.7)
Eosinophil %: 1.3 %
HCT: 37.2 % (ref 35.0–47.0)
HGB: 12.4 g/dL (ref 12.0–16.0)
LYMPHS PCT: 27.7 %
Lymphocyte #: 3.1 10*3/uL (ref 1.0–3.6)
MCH: 31.9 pg (ref 26.0–34.0)
MCHC: 33.2 g/dL (ref 32.0–36.0)
MCV: 96 fL (ref 80–100)
MONOS PCT: 6.3 %
Monocyte #: 0.7 x10 3/mm (ref 0.2–0.9)
Neutrophil #: 7.2 10*3/uL — ABNORMAL HIGH (ref 1.4–6.5)
Neutrophil %: 63.3 %
PLATELETS: 191 10*3/uL (ref 150–440)
RBC: 3.88 10*6/uL (ref 3.80–5.20)
RDW: 12.2 % (ref 11.5–14.5)
WBC: 11.3 10*3/uL — AB (ref 3.6–11.0)

## 2015-04-06 LAB — BASIC METABOLIC PANEL
Anion Gap: 7 (ref 7–16)
BUN: 15 mg/dL
CO2: 26 mmol/L
CREATININE: 0.59 mg/dL
Calcium, Total: 9.2 mg/dL
Chloride: 108 mmol/L
EGFR (African American): >60
EGFR (Non-African Amer.): >60
GLUCOSE: 123 mg/dL — AB
Potassium: 4 mmol/L
Sodium: 141 mmol/L

## 2015-04-07 LAB — URINALYSIS, COMPLETE
BLOOD: NEGATIVE
Bilirubin,UR: NEGATIVE
Glucose,UR: NEGATIVE mg/dL (ref 0–75)
KETONE: NEGATIVE
Nitrite: NEGATIVE
PH: 7 (ref 4.5–8.0)
PROTEIN: NEGATIVE
SPECIFIC GRAVITY: 1.009 (ref 1.003–1.030)

## 2015-04-07 LAB — CBC WITH DIFFERENTIAL/PLATELET
BASOS PCT: 0.7 %
Basophil #: 0.1 10*3/uL (ref 0.0–0.1)
EOS PCT: 0.9 %
Eosinophil #: 0.1 10*3/uL (ref 0.0–0.7)
HCT: 38.1 % (ref 35.0–47.0)
HGB: 12.7 g/dL (ref 12.0–16.0)
Lymphocyte #: 2.4 10*3/uL (ref 1.0–3.6)
Lymphocyte %: 24.2 %
MCH: 32.4 pg (ref 26.0–34.0)
MCHC: 33.4 g/dL (ref 32.0–36.0)
MCV: 97 fL (ref 80–100)
MONOS PCT: 8.1 %
Monocyte #: 0.8 x10 3/mm (ref 0.2–0.9)
NEUTROS PCT: 66.1 %
Neutrophil #: 6.5 10*3/uL (ref 1.4–6.5)
Platelet: 183 10*3/uL (ref 150–440)
RBC: 3.93 10*6/uL (ref 3.80–5.20)
RDW: 12.7 % (ref 11.5–14.5)
WBC: 9.8 10*3/uL (ref 3.6–11.0)

## 2015-04-07 LAB — HEPATIC FUNCTION PANEL A (ARMC)
ALBUMIN: 4.1 g/dL
ALK PHOS: 72 U/L
ALT: 12 U/L — AB
AST: 16 U/L
BILIRUBIN TOTAL: 0.2 mg/dL — AB
Total Protein: 6.7 g/dL

## 2015-04-07 LAB — TSH: THYROID STIMULATING HORM: 2.879 u[IU]/mL

## 2015-04-07 LAB — FOLATE: Folic Acid: 9.7 ng/mL

## 2015-04-07 LAB — AMMONIA: Ammonia, Plasma: 19 umol/L

## 2015-04-07 LAB — SEDIMENTATION RATE: ERYTHROCYTE SED RATE: 25 mm/h — AB (ref 0–20)

## 2015-04-09 LAB — URINE CULTURE

## 2015-04-16 LAB — PREGNANCY, URINE: PREGNANCY TEST, URINE: NEGATIVE m[IU]/mL

## 2015-04-17 NOTE — H&P (Signed)
PATIENT NAME:  April Luna, April Luna MR#:  [id number] DATE OF BIRTH:  1971-08-26  DATE OF ADMISSION:  07/20/2014  REFERRING PHYSICIAN:  Emergency Room M.D.  ATTENDING PHYSICIAN: Orson Slick, M.D.   IDENTIFYING DATA:  April Luna is a 44 year old female with history of bipolar illness.   CHIEF COMPLAINT: "I've been worried."   HISTORY OF PRESENT ILLNESS: April Luna was hospitalized at [facility] at the beginning of June. She was discharged to home and has been compliant with medications as indicated by therapeutic lithium level. A few weeks ago, she started feeling paranoid, afraid that people are out to get April Luna, watching for snipers. She was unable to sleep. She stopped eating and lost some weight. This is all in the context of treatment compliance. She felt that she has been [employer], hallucinating some. She denies symptoms of depression. She did smoke marijuana last week and now is frightened that she is addicted to marijuana. There are no other substances or alcohol involved.   PAST PSYCHIATRIC HISTORY: Multiple psychiatric hospitalizations. 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 She was unable to return to work after April Luna first psychotic break. She has been tried on numerous medications. Sometimes she is noncompliant. She has been on lithium and Navane for the past several years with overall good results. There were no suicide attempts.   FAMILY PSYCHIATRIC HISTORY: Mother with bipolar, son with ADHD.  PAST MEDICAL HISTORY: Hypothyroidism, GERD, constipation.   ALLERGIES: ABILIFY, PENICILLIN, PERPHENAZINE, SULFA DRUGS.   MEDICATIONS ON ADMISSION: Amantadine 100 mg twice daily, BuSpar 15 mg twice daily, Klonopin 0.5 mg 3 times daily, Colace 200 mg twice daily, Neurontin 100 mg 3 times daily, Synthroid 75 mcg daily, lithium 200 mg 3 times daily, pantoprazole 40 mg daily, MiraLAX 17 grams as needed,  Restoril 15 mg at bedtime and Navane 4 mg at bedtime.   SOCIAL HISTORY: She, is married. She lives with April Luna husband and April Luna young son. She is disabled. She has supportive parents and church.     REVIEW OF SYSTEMS:   CONSTITUTIONAL: No fevers or chills. Positive for weight loss.  EYES: No double or blurred vision.  EAR, NOSE, THROAT: No hearing loss.  RESPIRATORY: No shortness of breath or cough.  CARDIOVASCULAR: No chest pain or orthopnea.   GASTROINTESTINAL: No abdominal pain, nausea, vomiting or diarrhea.  GENITOURINARY: No incontinence or frequency.  ENDOCRINE: No heat or cold intolerance.  LYMPHATIC: No anemia or easy bruising.  INTEGUMENTARY: No acne or rash.  MUSCULOSKELETAL: No muscle or joint pain.  NEUROLOGIC: No tingling or weakness.  PSYCHIATRIC: See history of present illness for details.   PHYSICAL EXAMINATION: VITAL SIGNS: Blood pressure 144/84, pulse 82, respirations 20, temperature 98.6.  GENERAL: This is a middle-aged female in no acute distress.  HEENT: The pupils are equal, round and reactive to light. Sclerae are anicteric.  NECK: Supple. No thyromegaly.  LUNGS: Clear to auscultation. No dullness to percussion.  HEART: Regular rhythm and rate. No murmurs, rubs or gallops.  ABDOMEN: Soft, nontender, nondistended. Positive bowel sounds.  MUSCULOSKELETAL: Normal muscle strength in all extremities.  SKIN: No rashes or bruises.  LYMPHATIC: No cervical adenopathy.  NEUROLOGIC: Cranial nerves II through XII are intact.   LABORATORY DATA: Chemistries are within normal limits. Blood alcohol level is 0. LFTs within normal limits. Lithium level 0.88. Urine tox screen positive for cannabinoids. CBC within normal limits except for white blood count of 22.3. Urinalysis:  Leukocyte esterase 1+, 4 white cells per field. Serum acetaminophen less than 2. Serum salicylates 4.1.   MENTAL STATUS EXAMINATION: The patient is alert and oriented to person, place, time and situation. She  is pleasant, polite and cooperative. She recognizes me from previous admission. She is marginally groomed, as she is wearing hospital scrubs. She maintains good eye contact. April Luna speech is slightly pressured. Mood is depressed with anxious affect. Thought process is logical but influenced by paranoid delusions. She denies suicidal or homicidal ideation. She denies hallucinations, but could hear April Luna name called and felt that people are out in the yard while at home  April Luna cognition is grossly intact. Registration, recall, short and long-term memory are intact. She is of above average intelligence and fund of knowledge. April Luna insight and judgment are limited.   SUICIDE RISK ASSESSMENT ON ADMISSION: This is a patient with a long history of bipolar illness who came to the hospital psychotic in spite of recent hospitalization and good medication compliance.   INITIAL DIAGNOSES:  AXIS I: Bipolar disorder, mixed with psychosis.  AXIS II: Deferred.  AXIS III: Hypothyroidism, gastroesophageal reflux disease.  AXIS IV: Mental illness.  AXIS V: Global assessment of functioning 35.   PLAN: The patient was admitted to [facility] behavioral medicine unit for safety, stabilization and medication management. She was initially placed on suicide precautions and was closely monitored for any unsafe behaviors. She underwent full psychiatric and risk assessment. She received pharmacotherapy, individual and group psychotherapy, substance abuse counseling and support from therapeutic milieu.  1.  Psychosis: We will restart lithium and Navane; increase Navane to 6 mg a day.  2.  Insomnia: We will reinstate Restoril.  3.  Anxiety: The patient is on BuSpar, and we will restart clonazepam.  4.  Medical: We will continue all medicines as in the community.  5.  Disposition: She will be discharged to home. She will follow up with April Luna regular psychiatrist.     ____________________________ Wardell Honour.  Bary Leriche, MD jbp:dmm D: 07/20/2014 18:44:34 ET T: 07/20/2014 19:33:25 ET JOB#: [id number]  cc: Karrie Fluellen B. Bary Leriche, MD, <Dictator> Clovis Fredrickson MD ELECTRONICALLY SIGNED 07/25/2014 7:22

## 2015-04-17 NOTE — Consult Note (Signed)
Brief Consult Note: Diagnosis: bipolar disorder.   Patient was seen by consultant.   Consult note dictated.   Comments: Appreciate consult for 44y/o caucasian woman admitted for history of bipolar disorder, for evaluation of intermittent LLQ pain for the last 81m.  States she doesn't always have it but that it does increase with stress. Nuts seem to trigger it as well. States last episode last thursday which lasted a few days, felt feverish. Thinks she may have diverticular disease. Has noticed that when she takes otc prilosec, her abdomen does not hurt at all. Does state she uses Naprosyn 2 tabs on a regular basis. Reports she lost 5lb over the last week, but has been eating well here. Denies gerd symptoms, problems swallowing, melena/hematochezia, NVD, further GI complaints. Impression and plan: Recurrent LLQ chronic pain. May have IBS, diverticular discomfort, other. Do recommend Prilosec 20mg  po daily as this has helped her in past and substituting acetaminophen according to pack instructions due to GI side effects of NSAIDs, Discussed this with patient. Considering CT of abdomen/pelvis due to recurrent abd pain.  Electronic Signatures: Stephens November H (NP)  (Signed 03-Jun-15 18:04)  Authored: Brief Consult Note   Last Updated: 03-Jun-15 18:04 by Theodore Demark (NP)

## 2015-04-17 NOTE — Consult Note (Signed)
Chief Complaint:  Subjective/Chief Complaint seen for left sided abdominal pain.  chrrently 1/10 discomfort.  no nausea or emesis.  tolerate CT.   VITAL SIGNS/ANCILLARY NOTES: **Vital Signs.:   04-Jun-15 07:35  Vital Signs Type Routine  Celsius 37.1  Pulse Pulse 105  Respirations Respirations 20  Systolic BP Systolic BP 433  Diastolic BP (mmHg) Diastolic BP (mmHg) 87  Systolic BP Systolic BP 295  Diastolic BP (mmHg) Diastolic BP (mmHg) 86  *Intake and Output.:   04-Jun-15 07:35  Stool  yes   Brief Assessment:  Cardiac Regular   Respiratory coarse wheezes and rhonchi bilaterally   Gastrointestinal details normal Soft  Nondistended  No masses palpable  Bowel sounds normal  No rebound tenderness  minimal discomfort  to left of umbilicus.   Lab Results: Routine Chem:  04-Jun-15 06:45   Glucose, Serum  104  Creatinine (comp) 1.04  Sodium, Serum 136  Potassium, Serum 4.3  Chloride, Serum 106  CO2, Serum 27  Calcium (Total), Serum 9.5  Anion Gap  3  Osmolality (calc) 272  eGFR (African American) >60  eGFR (Non-African American) >60 (eGFR values <56m/min/1.73 m2 may be an indication of chronic kidney disease (CKD). Calculated eGFR is useful in patients with stable renal function. The eGFR calculation will not be reliable in acutely ill patients when serum creatinine is changing rapidly. It is not useful in  patients on dialysis. The eGFR calculation may not be applicable to patients at the low and high extremes of body sizes, pregnant women, and vegetarians.)  HCG Betasubunit Quant. Serum  < 1 (1-3  (International Unit)  ----------------- Non-pregnant <5 Weeks Post LMP mIU/mL  3- 4 wk 9 - 130  4- 5 wk 75 - 2,600  5- 6 wk 850 - 20,800  6- 7 wk 4,000 - 100,000  7-12 wk 11,500 - 289,000 12-16 wk 18,000 - 137,000 16-29 wk 1,400 - 53,000 29-41 wk 940 - 60,000)  Routine Hem:  04-Jun-15 06:45   WBC (CBC)  16.0  RBC (CBC) 4.82  Hemoglobin (CBC) 15.8  Hematocrit  (CBC)  47.8  Platelet Count (CBC) 209  MCV 99  MCH 32.8  MCHC 33.0  RDW 13.1  Neutrophil % 82.6  Lymphocyte % 5.4  Monocyte % 8.1  Eosinophil % 3.5  Basophil % 0.4  Neutrophil #  13.2  Lymphocyte #  0.9  Monocyte #  1.3  Eosinophil # 0.6  Basophil # 0.1 (Result(s) reported on 28 May 2014 at 07:19AM.)   Radiology Results: CT:    04-Jun-15 14:19, CT Abdomen and Pelvis With Contrast  CT Abdomen and Pelvis With Contrast   REASON FOR EXAM:    (1) left abbdominal pain; (2) left abdominal pain  COMMENTS:       PROCEDURE: CT  - CT ABDOMEN / PELVIS  W  - May 28 2014  2:19PM     CLINICAL DATA:  Left upper quadrant pain    EXAM:  CT ABDOMEN AND PELVIS WITH CONTRAST    TECHNIQUE:  Multidetector CT imaging of the abdomen and pelvis was performed  using the standard protocol following bolus administration of  intravenous contrast.  COMPARISON:  None    CONTRAST:  100 mL Isovue    FINDINGS:  Lung bases are clear.  No pericardial fluid.    No focal hepatic lesion. Gallbladder, pancreas, spleen, adrenal  glands, adrenal glands normal. There is a coarse elongated  calcification within the left renal collecting system measuring 31  mm in craniocaudad  dimension and approximately 9 x 7 mm in axial  dimension. There is a low-density simple fluid attenuation cystic  lesion in the right kidney measuring 21 mm with several smaller sub  10 mm cortical hypodensities within the left and right kidney which  are too small to characterize.  Stomach, small bowel, appendix, and cecum are normal. The colon and  rectosigmoid colon are normal.    Abdominal aorta normal caliber. No retroperitoneal or periportal  lymphadenopathy. There are small left periaortic lymph nodes which  are not pathologic by size criteria largest measuring 8 mm (image  30, series 2).    No free fluid the pelvis. The uterus and ovaries are normal. No  pelvic lymphadenopathy. No aggressive osseous lesion.      IMPRESSION:  1. Large elongated left renal calculus.  No obstruction.  2. Benign appearing low-density right renal lesion.  3. Small periaortic lymph nodes are nonspecific are not pathologic  by size criteria.      Electronically Signed    By: Suzy Bouchard M.D.    On: 05/28/2014 14:36         Verified By: Rennis Golden, M.D.,   Assessment/Plan:  Assessment/Plan:  Assessment 1) left abdominal pain.  improving on ppi.  CT showing left renal calculus ?implication.  2) leukocytosis-ude   Plan 1) recommend IM consult re leukocytosis 2( continue ppi, may need upper gi versus egd, following for now.   Electronic Signatures: Loistine Simas (MD)  (Signed 04-Jun-15 15:18)  Authored: Chief Complaint, VITAL SIGNS/ANCILLARY NOTES, Brief Assessment, Lab Results, Radiology Results, Assessment/Plan   Last Updated: 04-Jun-15 15:18 by Loistine Simas (MD)

## 2015-04-17 NOTE — Consult Note (Signed)
Chief Complaint:  Subjective/Chief Complaint seen for left abdominal pain.  Patient reports no pain for several days, no heartburn or gerd.  eating regular diet. no nausea.   VITAL SIGNS/ANCILLARY NOTES: **Vital Signs.:   08-Jun-15 07:28  Celsius 36.9  Pulse Pulse 77  Respirations Respirations 20  Systolic BP Systolic BP 297  Diastolic BP (mmHg) Diastolic BP (mmHg) 81  Systolic BP Systolic BP 989  Diastolic BP (mmHg) Diastolic BP (mmHg) 87  *Intake and Output.:   08-Jun-15 07:29  Stool  yes   Brief Assessment:  Cardiac Regular   Respiratory clear BS   Gastrointestinal details normal Soft  Nontender  Nondistended  No masses palpable  Bowel sounds normal   Radiology Results: CT:    04-Jun-15 14:19, CT Abdomen and Pelvis With Contrast  CT Abdomen and Pelvis With Contrast   REASON FOR EXAM:    (1) left abbdominal pain; (2) left abdominal pain  COMMENTS:       PROCEDURE: CT  - CT ABDOMEN / PELVIS  W  - May 28 2014  2:19PM     CLINICAL DATA:  Left upper quadrant pain    EXAM:  CT ABDOMEN AND PELVIS WITH CONTRAST    TECHNIQUE:  Multidetector CT imaging of the abdomen and pelvis was performed  using the standard protocol following bolus administration of  intravenous contrast.  COMPARISON:  None    CONTRAST:  100 mL Isovue    FINDINGS:  Lung bases are clear.  No pericardial fluid.    No focal hepatic lesion. Gallbladder, pancreas, spleen, adrenal  glands, adrenal glands normal. There is a coarse elongated  calcification within the left renal collecting system measuring 31  mm in craniocaudad dimension and approximately 9 x 7 mm in axial  dimension. There is a low-density simple fluid attenuation cystic  lesion in the right kidney measuring 21 mm with several smaller sub  10 mm cortical hypodensities within the left and right kidney which  are too small to characterize.  Stomach, small bowel, appendix, and cecum are normal. The colon and  rectosigmoid colon are  normal.    Abdominal aorta normal caliber. No retroperitoneal or periportal  lymphadenopathy. There are small left periaortic lymph nodes which  are not pathologic by size criteria largest measuring 8 mm (image  30, series 2).    No free fluid the pelvis. The uterus and ovaries are normal. No  pelvic lymphadenopathy. No aggressive osseous lesion.     IMPRESSION:  1. Large elongated left renal calculus.  No obstruction.  2. Benign appearing low-density right renal lesion.  3. Small periaortic lymph nodes are nonspecific are not pathologic  by size criteria.      Electronically Signed    By: Suzy Bouchard M.D.    On: 05/28/2014 14:36         Verified By: Rennis Golden, M.D.,   Assessment/Plan:  Assessment/Plan:  Assessment 1) left abdominal pain-resolved with ppi-of note patietn wase positive for salicylate and had also been taking nsaids pta.   2) left nephrolithiasis.  on abx for UTI.  3) bipolar-per Psych.   Plan 1) continue po ppi daily as o/p-can change to OTC omeprazole or esomeprazole 40 mg daily.  Patietn instructed to take one hour AC. GI fu in 2 weeks.  2) finish 7 day course of cipro.  She will need o/p fu with Urology-she can be seen in GI for o/p fu in 2 weeks, and we can help arrange the Urology  fu.  3) avoid asa/nsaids.  will s/o, reconsult if needed.   Electronic Signatures: Loistine Simas (MD)  (Signed 08-Jun-15 15:53)  Authored: Chief Complaint, VITAL SIGNS/ANCILLARY NOTES, Brief Assessment, Radiology Results, Assessment/Plan   Last Updated: 08-Jun-15 15:53 by Loistine Simas (MD)

## 2015-04-17 NOTE — Consult Note (Signed)
PATIENT NAME:  April Luna, UN MR#:  [id number] DATE OF BIRTH:  20-Sep-1971  DATE OF CONSULTATION:  05/28/2014  REFERRING PHYSICIAN:  Dr. Gustavo Lah. CONSULTING PHYSICIAN:  Demetrios Loll, MD  PRIMARY CARE PHYSICIAN:  Dr. Brynda Greathouse.    REASON FOR CONSULTATION:  Leukocytosis.   REVIEW OF HISTORY:  The patient is a 44 year old female with a history of anxiety, bipolar disorder, was admitted for psychosis and anxiety behavior medicine. The patient complained of abdominal pain, so Dr. Gustavo Lah did consult. Since the patient has leukocytosis, Dr. Gustavo Lah requested a medical consult. The patient is alert, awake, oriented, in no acute distress. The patient complains of abdominal pain on the left side on and off for the past several months, which is intermittent, no radiation. The patient denies any hematuria, dysuria or urine frequency. Denies any fever or chills, headache or dizziness. No nausea, vomiting or diarrhea. No melena or bloody stool.  The patient's CAT scan done today, which shows left-sided kidney stone.   PAST MEDICAL HISTORY: Anxiety, bipolar disorder.   SOCIAL HISTORY: Smokes 1 to 2 packs a day since 44 years old. Denies any alcohol drinking or illicit drugs.   PAST SURGICAL HISTORY:  None.  FAMILY HISTORY: Mother, aunt, uncle had kidney stones.   ALLERGIES: ABILIFY, PENICILLIN, PERPHENAZINE, SULFA DRUGS.   HOME MEDICATIONS:   1.  Navane 4 mg p.o. at bedtime.  2.  Macrobid 100 mg p.o. b.i.d. 3.  Lithium carbonate 300 mg p.o. b.i.d., in addition to 300 mg 2 tablets b.i.d.  4.  Klonopin 1 mg p.o. once a day.  5.  BuSpar 15 mg p.o. b.i.d.  6.  Amantadine 100 mg p.o. b.i.d.     REVIEW OF SYSTEMS:   CONSTITUTIONAL: The patient denies any fever or chills. No headache or dizziness. No weakness.  EYES: No double vision or blurry vision.  ENT: No postnasal drip, slurred speech or dysphagia.  CARDIOVASCULAR: No chest pain, palpitation, orthopnea, nocturnal dyspnea. No leg edema.  PULMONARY:  No cough, sputum, shortness of breath or hemoptysis.  GASTROINTESTINAL:  Positive for abdominal pain on the left side. No nausea, vomiting, diarrhea. No melena or bloody stool.  GENITOURINARY: No dysuria, hematuria or incontinence.  SKIN: No rash or jaundice.  NEUROLOGY: No syncope, loss of consciousness or seizure.  ENDOCRINE: No polyuria, polydipsia, heat or cold intolerance.   PHYSICAL EXAMINATION: VITAL SIGNS: Temperature 98.8, blood pressure 134/87, pulse 105, O2 saturation 96% on room air.  GENERAL: The patient is alert, awake, oriented, in no acute distress.  HEENT: Pupils round, equal and reactive to light and accommodation. Moist oral mucosa. Clear oropharynx.  NECK: Supple. No JVD or carotid bruits. No lymphadenopathy. No thyromegaly.  CARDIOVASCULAR: S1, S2, regular rate, rhythm. No murmurs or gallops.  PULMONARY: Bilateral air entry. No wheezing or rales. No use of accessory muscle to breathe.  ABDOMEN: Soft. No distention. No tenderness. No organomegaly. Bowel sounds present.   EXTREMITIES: No edema, clubbing or cyanosis. No calf tenderness. Bilateral pedal pulses present.  SKIN: No rash or jaundice.  NEUROLOGY: A and O x 3. No focal deficit. Power 5/5. Sensation intact.   LABORATORY, DIAGNOSTIC AND RADIOLOGICAL DATA: CAT scan of the abdomen and pelvis shows large, elongated left renal calculus.  No obstruction.  Benign-appearing, low-density right renal lesion. WBC 16, hemoglobin 15.8, platelets 209. Glucose 104, BUN 11, creatinine 1.04. Electrolytes are normal. Urinalysis 4 days ago showed WBC 12, RBC 11, nitrite negative.   IMPRESSIONS: 1.  Left nephrolithiasis.  2.  Right benign low  density renal lesion.  3.  Urinary tract infection.  4.  Leukocytosis.  5.  Tobacco abuse.   RECOMMENDATIONS:   1.  We will start Cipro 500 mg p.o. b.i.d. and follow up CBC. We will get a urology consult for nephrolithiasis and right renal lesion.    2.  Smoking cessation was counseled for 4  minutes. The patient is on nicotine patch. The patient wants to quit smoking.  3.  I discussed the patient's recommendations with the patient.   TIME SPENT: About 52 minutes.   ____________________________ Demetrios Loll, MD qc:dmm D: 05/28/2014 17:28:57 ET T: 05/28/2014 18:16:44 ET JOB#: [id number]  cc: Demetrios Loll, MD, <Dictator> Demetrios Loll MD ELECTRONICALLY SIGNED 05/30/2014 16:08
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
## 2015-04-17 NOTE — Consult Note (Signed)
Chief Complaint:  Subjective/Chief Complaint seen for left abdominal pain.  denies abd pain today, no emesis, but mild nausea.   VITAL SIGNS/ANCILLARY NOTES: **Vital Signs.:   05-Jun-15 06:52  Vital Signs Type Routine  Temperature Temperature (F) 98.2  Celsius 36.7  Pulse Pulse 97  Respirations Respirations 20  Systolic BP Systolic BP 962  Diastolic BP (mmHg) Diastolic BP (mmHg) 85   Brief Assessment:  Cardiac Regular   Respiratory clear BS   Gastrointestinal details normal Soft  Nontender  Nondistended  No masses palpable  Bowel sounds normal   Lab Results: Routine Chem:  01-Jun-15 13:11   BUN 10  04-Jun-15 06:45   BUN 11  Routine Hem:  05-Jun-15 05:38   WBC (CBC)  15.6  RBC (CBC) 4.74  Hemoglobin (CBC) 15.5  Hematocrit (CBC) 46.9  Platelet Count (CBC) 183  MCV 99  MCH 32.7  MCHC 33.1  RDW 13.1  Neutrophil % 81.7  Lymphocyte % 5.0  Monocyte % 9.3  Eosinophil % 3.7  Basophil % 0.3  Neutrophil #  12.7  Lymphocyte #  0.8  Monocyte #  1.5  Eosinophil # 0.6  Basophil # 0.1 (Result(s) reported on 29 May 2014 at 06:39AM.)   Assessment/Plan:  Assessment/Plan:  Assessment 1) left abd pain-resolved, possible gastritis related to nsaids. On ppi.  2) elevated wbc, UTI-on abx, appreciate IM assistance. some mild nausea possible from abx.   Plan 1) continue daily ppi.  I will see patient monday if she is here, Dr Allen Norris covering GI if needed over the weekend.   Electronic Signatures: Loistine Simas (MD)  (Signed 05-Jun-15 16:25)  Authored: Chief Complaint, VITAL SIGNS/ANCILLARY NOTES, Brief Assessment, Lab Results, Assessment/Plan   Last Updated: 05-Jun-15 16:25 by Loistine Simas (MD)

## 2015-04-17 NOTE — H&P (Signed)
PATIENT NAME:  April Luna, April Luna MR#:  [id number] DATE OF BIRTH:  02/11/1971  DATE OF ADMISSION:  05/25/2014  REFERRING PHYSICIAN: Emergency Room M.D.   ATTENDING PHYSICIAN: Orson Slick, M.D.   IDENTIFYING DATA: April Luna is a 44 year old female with a history of severe bipolar illness.   CHIEF COMPLAINT: "I'm having a nervous breakdown."   HISTORY OF PRESENT ILLNESS:  April Luna has a long history of bipolar with multiple hospitalizations and frequent exacerbations. She has been in the care of Dr. Loni Muse. at [facility] up until December of last year, at which point his practice no longer took her insurance.  She was given 3 months' supply of medications. She tried to make appointment with other psychiatrists but was unable to attend her appointments mostly due to transportation problems. She has no car. She depends on her parents heavily but they have their own health problems now and were unable to help her. The patient is rather disorganized and it is not easy to obtain a good history from her but she reports that lately her medications have been prescribed by her primary provider. It is unclear whether or not she stopped taking medications at any time. She says that she has been off of Klonopin for the past 2 weeks. On the other hand, her lithium level on admission is above the therapeutic range. It is possible that she has been unable to take her medications as prescribed. She reports symptoms of depression with poor sleep, decreased appetite, anhedonia, feeling of guilt, hopelessness, worthlessness, crying spells, excessive worries that were accompanied by his psychotic symptoms with auditory or visual hallucinations and worries that someone is outside the window trying to hurt her. She did have similar episodes in the past. Sometimes she would run away from home frightened, dressed  inappropriately for the weather. She had to be rescued by her family, her husband, church people. She had the good  sense this time around to come to the hospital when feeling unsafe in order to have her medications adjusted.    PAST PSYCHIATRIC HISTORY: Multiple, multiple hospitalizations. She was at [facility] at least 9 or 10 times. There is a history of treatment noncompliance for multiple reasons, sometimes financial, sometimes psychotic disorganization. She gets manic and psychotic quite frequently. Her first hospitalization was in her 60s, just after she graduated from college. She had to stop working professionally. There were no suicide attempts.   FAMILY PSYCHIATRIC HISTORY: She has a mother with bipolar and a son with attention deficit/hyperactivity disorder.   PAST MEDICAL HISTORY: Diabetes, GERD, dyslipidemia, hypothyroidism, sleep apnea, B12 deficiency.   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 She is married. Her husband works Retail buyer. They have a son who is 38 years old. The patient is on disability. They have ongoing financial and transportation problems. The patient is very active in her local church and finds a lot of support there. She is a smoker.   REVIEW OF SYSTEMS:   CONSTITUTIONAL: No fevers or chills. No weight changes.  EYES: No double or blurred vision.  ENT: No hearing loss.  RESPIRATORY: No shortness of breath or cough.  CARDIOVASCULAR: No chest pain  or orthopnea.  GASTROINTESTINAL: No abdominal pain, nausea, vomiting or diarrhea.  GENITOURINARY: No incontinence or frequency.  ENDOCRINE: No heat or cold intolerance.  LYMPHATIC: No anemia or easy bruising.  INTEGUMENTARY: No acne or rash.  MUSCULOSKELETAL: No muscle or joint pain.  NEUROLOGIC:  No tingling or weakness.  PSYCHIATRIC: See history of present illness for details.   PHYSICAL EXAMINATION: VITAL SIGNS: Blood pressure 147/91, pulse 94, respirations 18, temperature 98.2.  GENERAL: This is a well-developed female in no acute distress.  HEENT: The pupils are equal, round and reactive to light. Sclerae are anicteric.  NECK: Supple. No thyromegaly.  LUNGS: Clear to auscultation. No dullness to percussion.  HEART: Regular rhythm and rate. No murmurs, rubs or gallops.  ABDOMEN: Soft, nontender, nondistended. Positive bowel sounds.  MUSCULOSKELETAL: Normal muscle strength in all extremities.  SKIN: No rashes or bruises.  LYMPHATIC: No cervical adenopathy.  NEUROLOGIC: Cranial nerves II through XII are intact.   LABORATORY, DIAGNOSTIC AND RADIOLOGICAL DATA:  Chemistries are within normal limits. Blood glucose of 114, sodium 135. Blood alcohol level is zero. LFTs within normal limits. Lithium 1.76. Urine tox screen is positive for cannabinoids. CBC within normal limits except for white blood count of 13.5, hemoglobin 17, hematocrit 50.4. Urinalysis is suggestive of urinary tract infection with leukocyte esterase 2+ and 12 white cells per field. Serum acetaminophen is less than 2, salicylates 5.4.   MENTAL STATUS EXAMINATION ON ADMISSION: The patient is alert and oriented to person, place, time and situation. She is pleasant, polite and cooperative. She recognizes me from previous admission. She maintains good eye contact. She is slightly tremulous and rather tearful talking about her troubles. Her speech is slightly pressured as she tries to explain everything to me very quickly. Her grooming is poor. Her mood is frightened with anxious affect. Thought process is logical with its own logic. She denies thoughts of hurting herself or others. She is paranoid and delusional. She endorses visual and auditory hallucinations. Her cognition is grossly intact. Her registration, recall and  short-term memory are intact. She is not a good historian today but just because of disorganization. She is above average intelligence and fund of knowledge. Her insight and judgment are limited today.   SUICIDE RISK ASSESSMENT: This is a patient with a severe bipolar illness but no suicide attempts, a good [place] and loving mother, wife and daughter.     INITIAL DIAGNOSES:  AXIS I: Bipolar disorder, depressed with psychosis.  AXIS II: Deferred.  AXIS III: Dyslipidemia, obesity, hypertension, hypothyroidism, sleep apnea, chronic obstructive pulmonary disease.  AXIS IV:  Mental illness, access to care, treatment compliance.  AXIS V: Global assessment of functioning 35.   PLAN: The patient was admitted to [facility] for safety, stabilization and medication management. She was initially placed on suicide precautions and was closely monitored for any unsafe behaviors. She underwent full psychiatric and risk assessment. She received pharmacotherapy, individual and group psychotherapy, substance abuse counseling and support from therapeutic milieu.  1.  Psychosis: Will restart Navane and Artane.  2.  Mood: We will hold lithium as lithium level is elevated but we will try to maintain the patient on lithium within therapeutic range.  3.  Medical: We will continue all her medicines as prescribed in the community.  4.  Anxiety: The patient is on low-dose clonazepam at home. She uses it as needed, will probably change it to standing dose 3 times a day.  5.  Disposition: She will be  discharged to home. She needs a followup.     ____________________________ Wardell Honour. Bary Leriche, MD jbp:cs D: 05/25/2014 20:31:14 ET T: 05/25/2014 20:54:28 ET JOB#: [id number]  cc: Bellah Alia B. Bary Leriche, MD, <Dictator> Clovis Fredrickson MD ELECTRONICALLY SIGNED 05/26/2014 5:33

## 2015-04-17 NOTE — Consult Note (Signed)
Brief Consult Note: Diagnosis: Bipolar disorder depressed with psychosis.   Patient was seen by consultant.   Recommend further assessment or treatment.   Orders entered.   Comments: Will admit to psychiatry.  Electronic Signatures: Orson Slick (MD)  (Signed 01-Jun-15 20:04)  Authored: Brief Consult Note   Last Updated: 01-Jun-15 20:04 by Orson Slick (MD)

## 2015-04-17 NOTE — Consult Note (Signed)
Chief Complaint:  Subjective/Chief Complaint Please see full GI consult and brief consult note.  Patietn admitted to [facility] for issues related to severe bipolar.  History of left abdominal pain for about 6 months.  Seems worsened with stress and better with OTC prilosec.  She states no overt nsaids, only one dose of naproxen after last hospitalizaion, however blood salicylate level elevated.   There is however a fhx of colon cancer with father in his 33's.  Will do ct with contrast tomorrow, consider further evaluation afterwards, place on daily ppi.  Following.   VITAL SIGNS/ANCILLARY NOTES: **Vital Signs.:   03-Jun-15 07:46  Vital Signs Type Routine  Temperature Temperature (F) 98.9  Pulse Pulse 101  Respirations Respirations 20  Systolic BP Systolic BP 771  Diastolic BP (mmHg) Diastolic BP (mmHg) 93  Systolic BP Systolic BP 165  Diastolic BP (mmHg) Diastolic BP (mmHg) 94   Brief Assessment:  Cardiac Regular   Respiratory clear BS   Gastrointestinal details normal Soft  Nondistended  No masses palpable  Bowel sounds normal  No rebound tenderness  minimal tenderness to left of umbilicus.   Lab Results: Hepatic:  01-Jun-15 13:11   Bilirubin, Total 0.5  Alkaline Phosphatase 110 (45-117 NOTE: New Reference Range 11/14/13)  SGPT (ALT) 17  SGOT (AST) 15  Total Protein, Serum 7.8  Albumin, Serum 4.4  TDMs:  01-Jun-15 13:11   Lithium, Serum  1.76 (0.60-1.20 TOXIC >= 1.50 mmol/L)  03-Jun-15 06:15   Lithium, Serum 0.64 (0.60-1.20 TOXIC >= 1.50 mmol/L)  General Ref:  01-Jun-15 13:11   Acetaminophen, Serum < 2 (10-30 POTENTIALLY TOXIC:  > 200 mcg/mL  > 50 mcg/mL at 12 hr after  ingestion  > 300 mcg/mL at 4 hr after  ingestion)  Salicylates, Serum  5.4 (0.0-2.8 Therapeutic 2.8-20.0 mg/dL Toxic >30.0 mg/dL)  Routine Chem:  01-Jun-15 13:11   Result Comment LITHIUM - RESULTS VERIFIED BY REPEAT TESTING.  - NOTIFIED OF CRITICAL VALUE  - READ-BACK PROCESS PERFORMED.  - C/JANE  ADAMS,RN _0  05/25/14  - .Marland KitchenSRB  Result(s) reported on 25 May 2014 at 05:26PM.  Glucose, Serum  114  BUN 10  Creatinine (comp) 0.91  Sodium, Serum  135  Potassium, Serum 3.7  Chloride, Serum 103  CO2, Serum 24  Calcium (Total), Serum 9.9  Osmolality (calc) 270  eGFR (African American) >60  eGFR (Non-African American) >60 (eGFR values <49m/min/1.73 m2 may be an indication of chronic kidney disease (CKD). Calculated eGFR is useful in patients with stable renal function. The eGFR calculation will not be reliable in acutely ill patients when serum creatinine is changing rapidly. It is not useful in  patients on dialysis. The eGFR calculation may not be applicable to patients at the low and high extremes of body sizes, pregnant women, and vegetarians.)  Anion Gap 8  Ethanol, S. < 3  Ethanol % (comp) < 0.003 (Result(s) reported on 25 May 2014 at 02:03PM.)  Urine Drugs:  [id number]  Tricyclic Antidepressant, Ur Qual (comp) NEGATIVE (Result(s) reported on 25 May 2014 at 01:32PM.)  Amphetamines, Urine Qual. NEGATIVE  MDMA, Urine Qual. NEGATIVE  Cocaine Metabolite, Urine Qual. NEGATIVE  Opiate, Urine qual NEGATIVE  Phencyclidine, Urine Qual. NEGATIVE  Cannabinoid, Urine Qual. POSITIVE  Barbiturates, Urine Qual. NEGATIVE  Benzodiazepine, Urine Qual. NEGATIVE (----------------- The URINE DRUG SCREEN provides only a preliminary, unconfirmed analytical test result and should not be used for non-medical  purposes.  Clinical consideration and professional judgment should be  applied to any positive drug  screen result due to possible interfering substances.  A more specific alternate chemical method must be used in order to obtain a confirmed analytical result.  Gas chromatography/mass spectrometry (GC/MS) is the preferred confirmatory method.)  Methadone, Urine Qual. NEGATIVE  Routine UA:  01-Jun-15 11:13   Color (UA) Amber  Clarity (UA) Hazy  Glucose (UA) Negative  Bilirubin  (UA) Negative  Ketones (UA) Trace  Specific Gravity (UA) 1.020  Blood (UA) Negative  pH (UA) 6.0  Protein (UA) 100 mg/dL  Nitrite (UA) Negative  Leukocyte Esterase (UA) 2+ (Result(s) reported on 25 May 2014 at 01:56PM.)  RBC (UA) 11 /HPF  WBC (UA) 12 /HPF  Bacteria (UA) NONE SEEN  Epithelial Cells (UA) 5 /HPF  Mucous (UA) PRESENT  Calcium Oxalate Crystal (UA) PRESENT (Result(s) reported on 25 May 2014 at 01:56PM.)  Routine Hem:  01-Jun-15 13:11   WBC (CBC)  13.5  RBC (CBC) 5.14  Hemoglobin (CBC)  17.0  Hematocrit (CBC)  50.4  Platelet Count (CBC) 268 (Result(s) reported on 25 May 2014 at 01:55PM.)  MCV 98  MCH 33.0  MCHC 33.7  RDW 12.8   Electronic Signatures: Loistine Simas (MD)  (Signed 03-Jun-15 19:37)  Authored: Chief Complaint, VITAL SIGNS/ANCILLARY NOTES, Brief Assessment, Lab Results   Last Updated: 03-Jun-15 19:37 by Loistine Simas (MD)

## 2015-04-18 NOTE — Consult Note (Signed)
Brief Consult Note: Diagnosis: Schizoaffectiver disorder bipolar type.   Patient was seen by consultant.   Consult note dictated.   Recommend further assessment or treatment.   Orders entered.   Discussed with Attending MD.   Comments: April Luna has a long h/o bipolar illness. She returns to the hospital floridly psychotic in spite of good treatment compliance.  PLAN: 1. Will admit to BMU when beds available.   2. I will restart all her medications.  Electronic Signatures: Orson Slick (MD)  (Signed 22-Apr-13 14:01)  Authored: Brief Consult Note   Last Updated: 22-Apr-13 14:01 by Orson Slick (MD)

## 2015-04-18 NOTE — Consult Note (Signed)
PATIENT NAME:  April Luna, April Luna MR#:  [id number] DATE OF BIRTH:  27-Jul-1971  DATE OF CONSULTATION:  01/06/2012  REFERRING PHYSICIAN:  Orson Slick, MD CONSULTING PHYSICIAN:  Veto Macqueen A. Posey Pronto, MD  REASON FOR CONSULTATION: Hematuria.   HISTORY OF PRESENT ILLNESS: April Luna is a 44 year old Caucasian female with history of bipolar disorder with most recent episode of mania with psychosis and history of hypothyroidism and hypertension who was admitted on the Behavior Medicine unit. She was noted to have back pain, hematuria, and dysuria. The patient has had UTIs in the past. She was given one dose of Rocephin, 1 gram, on 01/04/2012. The patient continues to have dark-colored urine along with symptoms of dysuria and with positive second urinalysis suggestive of likely urinary tract infection. She denies any fever. Her white count was elevated to 20,000 on admission.   PAST MEDICAL HISTORY:  1. Type 2 diabetes.  2. History of urinary tract infection in the past.  3. Bipolar disorder with mania and psychosis.  4. Gastroesophageal reflux disease. 5. Dyslipidemia.  6. Hypothyroidism.  7. B12 deficiency.  8. Sleep apnea.   ALLERGIES: Abilify, perphenazine, sulfa drugs, and penicillin.   ADMISSION MEDICATIONS:  1. Aspirin 81 mg daily.  2. Navane 2 mg three times daily. 3. Enalapril 2.5 mg daily.  4. Glucotrol XL 2.5 mg daily.  5. Lithobid 300 mg in the morning and 600 mg twice a day; this has been on hold at present.  6. Metformin 1000 mg twice a day.  7. Amantadine 100 mg twice a day. 8. Pravachol 20 mg at bedtime.  9. Vitamin B12 100,000 mcg p.o. daily.  10. Klonopin 0.5 mg three times daily. 11. Lamictal 200 mg at bedtime.  12. BuSpar 10 mg three times daily. 13. Albuterol as needed.  14. Synthroid 0.075 mg daily.  15. Ambien at nighttime.   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  She is separated from her husband. She lives with her cousin. She is a smoker.   REVIEW OF SYSTEMS: CONSTITUTIONAL: No fever, fatigue, or weakness. Positive for back pain. EYES: No blurred or double vision. ENT: No tinnitus, ear pain, or hearing loss. RESPIRATORY: No cough, wheeze, or hemoptysis. CARDIOVASCULAR: No chest pain, orthopnea, or edema. GASTROINTESTINAL: No nausea, vomiting, or abdominal pain. GU: No dysuria or hematuria. ENDOCRINE: No polyuria or nocturia. Positive for hypothyroidism. HEMATOLOGY: No anemia or easy bruising. SKIN: No acne or rash. MUSCULOSKELETAL: Positive for arthritis. NEUROLOGIC: No cerebrovascular accident or transient ischemic attack. PSYCH: No anxiety or depression. All other systems are reviewed and negative.   FAMILY HISTORY: Mother has bipolar disorder. Her son who is 18 years old was diagnosed with attention deficit/hyperactivity disorder.   PHYSICAL EXAMINATION:   GENERAL: The patient is awake, alert, and oriented x3, not in acute distress, pleasant personality at present.   VITALS: Pulse 93, afebrile, and blood pressure 115/87.   HEENT: Atraumatic, normocephalic. Pupils are equal, round, and reactive to light and accommodation. Extraocular movements intact. Oral mucosa is moist.   NECK: Supple. No JVD. No carotid bruit.   LUNGS: Clear to auscultation bilaterally. No rales, rhonchi, respiratory distress, or labored breathing.   HEART: Both heart sounds are normal. Rate and rhythm is regular. PMI is not lateralized. Chest is nontender.   EXTREMITIES: Good pedal pulses. Good femoral pulses. No lower extremity edema.   ABDOMEN: Soft, benign, and nontender. No organomegaly. Positive bowel sounds.  NEUROLOGIC: Grossly intact cranial nerves II through XII. No motor or sensory deficits.   PSYCH: The patient is awake, alert, and oriented x3.   SKIN: Warm and dry.   LABS/STUDIES: Urinalysis is positive for urinary tract infection with 2+ blood, leukocyte  esterase trace, 2+ bacteria, and plenty of RBCs and WBCs.   Glucose 115, BUN 15, creatinine 0.9, sodium 135, potassium 3.7, and chloride 100. Serum lithium 0.40. White count is down to 11,000 and hemoglobin and hematocrit 16.5 and 49.1.   Chest x-ray: No acute cardiopulmonary disease.   White count on admission was 21,000. Urine drug screen is negative. TSH 2.22. Serum lithium on admission was 1.52.   ASSESSMENT AND PLAN: 44 year old April Luna with:  1. Hematuria, dysuria, and back pain with abnormal urinalysis suggestive of urinary tract infection suspected due to urinary tract infection. The patient is prone to urinary tract infections and has had UTIs in the past with similar symptoms. She received one dose of IM Rocephin on admission. We will start p.o. Cipro for seven days, follow-up urine culture and repeat urinalysis after she has completed a course to ensure clearing of hematuria. If not consider CT of the abdomen to rule out stones or any other abnormalities.  2. Type 2 diabetes: Continue glipizide and metformin as you are.  3. Bipolar disorder: Per psychiatry. 4. Hypothyroidism: The patient is on levothyroxine, 0.075 mg.  5. Gastroesophageal reflux disease: Continue Maalox. Continue antacid double-strength as needed.     6. Hyperlipidemia: On pravastatin.   Thank you for the consult. We will follow while the patient is in house.  TIME SPENT: 45 minutes.  ____________________________ Hart Rochester Posey Pronto, MD sap:slb D: 01/06/2012 14:05:53 ET T: 01/06/2012 14:31:34 ET JOB#: [id number]  cc: Damyah Gugel A. Posey Pronto, MD, <Dictator> Jolanta B. Bary Leriche, MD    Ilda Basset MD ELECTRONICALLY SIGNED 01/09/2012 15:25

## 2015-04-18 NOTE — H&P (Signed)
PATIENT NAME:  April Luna, April Luna MR#:  [id number] DATE OF BIRTH:  1971/07/04  DATE OF ADMISSION:  04/15/2012  REFERRING PHYSICIAN: Lenise Arena, MD   ATTENDING PHYSICIAN: Orson Slick, MD    IDENTIFYING DATA: April Luna is a 44 year old female with a history of mood instability and psychosis.   CHIEF COMPLAINT: "I'm getting more paranoid".   HISTORY OF PRESENT ILLNESS: April Luna was hospitalized twice in January of 2013 for exacerbation of bipolar illness. She has been compliant with her outpatient psychiatric treatment and did see me on the 17th in my office as she was mildly paranoid at that time but otherwise rather stable on her current regimen. She came to the Emergency Room last night floridly psychotic and paranoid. She believes now that her brother-in-law and her husband conspired to kill her. She fears her husband. She wants to get a divorce. She ran away from home with her son. Interestingly, she came to the hospital of her own will rather than like the last time when she was brought to the hospital by church members. She reports extremely poor sleep, decreased appetite with weight loss, racing thoughts, feeling of restlessness and agitation, irritability and poor anger control. She reports that she has been angry with her young son which frightened her. She does report that her husband, Marland Kitchen, lost his job or that he is no longer able to work due to his COPD. I met Marland Kitchen in January. He appeared well. It may also be part of her delusion. She reports barricading herself in the house in fear of being targeted by the husband's family. As far as I know, her husband is from the [place] area and this is where his family resides. I have no information about any of his family members present in [place], [place]. She has not been completely consistent with her medication as indicated by lower lithium level of 0.4. This is unusual for [facility] I wonder if she was too  disorganized to take medication properly or if she ran out of money to refill prescriptions. We will have to get in touch with the pharmacy. She has a disability check but if her husband is not working they will be short especially that the patient already spent her tax refund buying some household items, pretty sensible at the time like a set of  new towels. She told me then that never in her life has she had a set of towels. She denies alcohol or illicit substance use.   PAST PSYCHIATRIC HISTORY: There were several hospitalizations for exacerbation of psychosis and mood symptoms. Frequently she gets manic and paranoid and delusional. Her first hospitalization was in her 7's and she was forced to take disability from a lucrative job. There were no suicide attempts.   FAMILY PSYCHIATRIC HISTORY: Her mother suffers bipolar illness and has been a patient here frequently. Her son is diagnosed with ADHD.   PAST MEDICAL HISTORY:  1. Diabetes.  2. Gastroesophageal reflux disease. 3. Dyslipidemia. 4. Hypothyroidism. 5. Sleep apnea. 6. B12 deficiency.  ALLERGIES: Abilify, perphenazine, sulfa drugs, penicillin.   MEDICATIONS ON ADMISSION:  1. Amantadine 100 mg twice daily.  2. Aspirin 81 mg daily.  3. Enalapril 2.5 mg daily.  4. Synthroid 75 mcg daily.  5. Pravachol 20 mg daily.  6. Lamictal 200 mg at bedtime. 7. Colace 100 mg twice daily.  8. Temazepam 15 mg at bedtime. 9. Lithium carbonate 600 mg twice daily.  10. Clonazepam 0.5 mg 3 times a day as needed.  11. Navane 4 mg at bedtime. 12. Latuda 80 mg with supper.   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 She is married. Her husband works as a Astronomer or used to work. They both take care of a 10-year-old son, [name]. The patient is on disability. The continuously experience financial problems. They have no transportation. She has  support from a local church across the street from the place she lives. She is a smoker.  REVIEW OF SYSTEMS: CONSTITUTIONAL: No fevers or chills. Positive for some weight loss. EYES: No double or blurred vision. ENT: No hearing loss. RESPIRATORY: No cough or shortness of breath. CARDIOVASCULAR: No chest pain or orthopnea. GASTROINTESTINAL: No abdominal pain, nausea, vomiting, or diarrhea. GU: No incontinence or frequency. ENDOCRINE: No heat or cold intolerance. LYMPHATIC: No anemia or easy bruising. INTEGUMENTARY: No acne or rash. MUSCULOSKELETAL: No muscle or joint pain. NEUROLOGIC: No tingling or weakness. PSYCHIATRIC: See history of present illness for details.   PHYSICAL EXAMINATION:   VITAL SIGNS: Blood pressure 151/101, pulse 100, respirations 20, temperature 96.8.   GENERAL: This is a thin, anxious appearing female.   HEENT: The pupils are equal, round, and reactive to light. Sclerae anicteric.   NECK: Supple. No thyromegaly.   LUNGS: Clear to auscultation. No dullness to percussion.   HEART: Regular rhythm and rate. No murmurs, rubs, or gallops.   ABDOMEN: Soft, nontender, nondistended. Positive bowel sounds.   MUSCULOSKELETAL: No stiffness or weakness.   SKIN: No lesions.   LYMPHATIC: No cervical adenopathy.   NEUROLOGICAL: Cranial nerves II through XII are intact. Normal gait.   LABORATORY DATA: Chemistries are within normal limits. Blood alcohol level 0. LFTs within normal limits. TSH 1.48. Lithium 0.41. Urine tox screen negative for substances. CBC within normal limits. Urinalysis is suggestive of urinary tract infection with urine culture growing gram-negative rods, sensitivity to follow. Urine pregnancy test negative. Serum acetaminophen less than 2. Serum salicylates 3.9.   MENTAL STATUS EXAMINATION ON ADMISSION: The patient is alert and oriented to person, place, time, and somewhat situation. She is pleasant, polite, and cooperative. She seems frightened. There is some  psychomotor restlessness and fidgeting. She is poorly groomed and casually dressed. She looks tired. She maintains good eye contact. Her mood is frightened with flat affect. Thought processing is disorganized and influenced by paranoid delusions. She denies auditory or visual hallucinations. Her cognition is grossly intact. She registers 3 out of 3 and recalls 3 out of 3 objects after five minutes. She can spell world forward and backward. She knows the current Software engineer. Her insight and judgment are fair.   SUICIDE RISK ASSESSMENT ON ADMISSION: This is a patient with history of psychosis and severe mood instability who is floridly psychotic most likely in the context of medication noncompliance. She has no history of suicide attempts.   ASSESSMENT:  AXIS I: Bipolar affective disorder, mixed episode.   AXIS II: Deferred.   AXIS III:  1. Hypothyroidism.  2. Hypertension.  3. Dyslipidemia.   AXIS IV: Mental and physical illness, marital problems, financial, primary support.   AXIS V: GAF on admission 20.   PLAN: The patient was admitted to [employer] [facility] for safety, stabilization, and medication management. She was initially placed on suicide precautions and was closely monitored for any unsafe behaviors. She underwent full psychiatric and risk assessment. She received pharmacotherapy, individual and group psychotherapy, substance abuse  counseling, and support from therapeutic milieu.  1. Suicidal ideation. The patient is not suicidal. 2. Mood. Will continue all her medications as in the community. Will increase the dose of Latuda. She did very well on a combination of lithium and Navane for a while but this may need to change.     3. Medical. We will continue antihypertensives.  4. Disposition. She will be discharged to home with family. She will follow-up with me.   ____________________________ Wardell Honour. Bary Leriche, MD jbp:drc D: 04/16/2012  12:42:00 ET T: 04/16/2012 13:09:33 ET JOB#: [id number]  cc: Lanny Lipkin B. Bary Leriche, MD, <Dictator> Clovis Fredrickson MD ELECTRONICALLY SIGNED 04/21/2012 18:48

## 2015-04-20 LAB — COMPREHENSIVE METABOLIC PANEL
ALBUMIN: 3.7 g/dL
ALK PHOS: 80 U/L
AST: 29 U/L
Anion Gap: 7 (ref 7–16)
BUN: 12 mg/dL
Bilirubin,Total: 0.2 mg/dL — ABNORMAL LOW
CHLORIDE: 105 mmol/L
Calcium, Total: 9.2 mg/dL
Co2: 27 mmol/L
Creatinine: 0.68 mg/dL
EGFR (African American): >60
EGFR (Non-African Amer.): >60
GLUCOSE: 95 mg/dL
Potassium: 3.9 mmol/L
SGPT (ALT): 30 U/L
Sodium: 139 mmol/L
Total Protein: 6.5 g/dL

## 2015-04-20 LAB — CBC WITH DIFFERENTIAL/PLATELET
BASOS ABS: 0.1 10*3/uL (ref 0.0–0.1)
Basophil %: 1 %
EOS PCT: 2.5 %
Eosinophil #: 0.2 10*3/uL (ref 0.0–0.7)
HCT: 39.2 % (ref 35.0–47.0)
HGB: 13.2 g/dL (ref 12.0–16.0)
Lymphocyte #: 2.4 10*3/uL (ref 1.0–3.6)
Lymphocyte %: 32.6 %
MCH: 32.2 pg (ref 26.0–34.0)
MCHC: 33.5 g/dL (ref 32.0–36.0)
MCV: 96 fL (ref 80–100)
MONO ABS: 0.6 x10 3/mm (ref 0.2–0.9)
Monocyte %: 8 %
NEUTROS PCT: 55.9 %
Neutrophil #: 4.2 10*3/uL (ref 1.4–6.5)
Platelet: 165 10*3/uL (ref 150–440)
RBC: 4.08 10*6/uL (ref 3.80–5.20)
RDW: 12.4 % (ref 11.5–14.5)
WBC: 7.5 10*3/uL (ref 3.6–11.0)

## 2015-04-20 LAB — AMMONIA: Ammonia, Plasma: 20 umol/L

## 2015-04-24 MED ORDER — LEVOTHYROXINE SODIUM 75 MCG PO TABS
75.0000 ug | ORAL_TABLET | Freq: Every day | ORAL | Status: DC
  Administered 2015-04-25 – 2015-04-28 (×4): 75 ug via ORAL
  Filled 2015-04-24 (×4): qty 1

## 2015-04-24 MED ORDER — HALOPERIDOL 2 MG PO TABS
2.0000 mg | ORAL_TABLET | Freq: Three times a day (TID) | ORAL | Status: DC | PRN
  Administered 2015-04-25: 2 mg via ORAL
  Filled 2015-04-24: qty 1

## 2015-04-24 MED ORDER — MAGNESIUM HYDROXIDE 400 MG/5ML PO SUSP
30.0000 mL | Freq: Every evening | ORAL | Status: DC | PRN
  Administered 2015-04-27: 30 mL via ORAL
  Filled 2015-04-24: qty 30

## 2015-04-24 MED ORDER — LACTULOSE 10 GM/15ML PO SOLN
10.0000 g | Freq: Every day | ORAL | Status: DC
  Filled 2015-04-24 (×4): qty 15

## 2015-04-24 MED ORDER — ACETAMINOPHEN 325 MG PO TABS: 650.0000 mg | ORAL_TABLET | ORAL | Status: DC | PRN

## 2015-04-24 MED ORDER — PALIPERIDONE ER 3 MG PO TB24
9.0000 mg | ORAL_TABLET | Freq: Every day | ORAL | Status: DC
Start: 2015-04-25 — End: 2015-04-28
  Administered 2015-04-25 – 2015-04-27 (×3): 9 mg via ORAL
  Filled 2015-04-24 (×3): qty 3

## 2015-04-24 MED ORDER — PANTOPRAZOLE SODIUM 40 MG PO TBEC
40.0000 mg | DELAYED_RELEASE_TABLET | Freq: Every day | ORAL | Status: DC
  Administered 2015-04-25 – 2015-04-28 (×4): 40 mg via ORAL
  Filled 2015-04-24 (×4): qty 1

## 2015-04-24 MED ORDER — NICOTINE 10 MG IN INHA
1.0000 | RESPIRATORY_TRACT | Status: DC | PRN
  Administered 2015-04-27: 1 via RESPIRATORY_TRACT

## 2015-04-24 MED ORDER — TRAZODONE HCL 100 MG PO TABS
100.0000 mg | ORAL_TABLET | Freq: Every evening | ORAL | Status: DC | PRN
  Administered 2015-04-25: 100 mg via ORAL
  Filled 2015-04-24: qty 1

## 2015-04-25 DIAGNOSIS — F1210 Cannabis abuse, uncomplicated: Secondary | ICD-10-CM | POA: Diagnosis present

## 2015-04-25 DIAGNOSIS — F05 Delirium due to known physiological condition: Secondary | ICD-10-CM | POA: Diagnosis present

## 2015-04-25 DIAGNOSIS — F3110 Bipolar disorder, current episode manic without psychotic features, unspecified: Secondary | ICD-10-CM | POA: Diagnosis present

## 2015-04-25 NOTE — H&P (Signed)
PATIENT NAME:  April Luna, April Luna MR#:  [id number] DATE OF BIRTH:  [birth year]  DATE OF ADMISSION:  12/29/2014  DATE OF ASSESSMENT: 12/30/2014  REFERRING PHYSICIAN:  Emergency Room MD   ATTENDING PHYSICIAN:  Orson Slick, MD   IDENTIFYING DATA: April Luna is a 44 year old female with history of bipolar disorder.   CHIEF COMPLAINT: There are demons in the house.   HISTORY OF PRESENT ILLNESS: April Luna has a long history of bipolar disorder with several psychiatric hospitalizations for psychosis, mostly manic episode, but sometimes depressive episode with psychosis. She was hospitalized last time in August of 2015. She was discharged on a combination of lithium and Navane, that usually works for her. She works with [facility] in the community. According to the patient, she stopped taking medication for 5 for 6 days. ACT team was supposed to pick it up from the pharmacy. She has no transportation and depends entirely on help from her ACT team. After stopping her medications, she became floridly psychotic. She presented in the Emergency Room yesterday with symptoms of severe psychotic depression. She believes that there are demons in the house, that God wants her to kill herself. She has been hiding in the basement with her son, it is possible that she has been endangering her son, although she is a wonderful mother when well. Child protective services report has reportedly been fired. Today she is still very psychotic, hallucinating, talking to [place] whom she sees in the room, asking his permission to talk to me. She is disorganized but no longer depressed. To the contrary she feels great, wants to be discharged; feels that she can save the world. She did accept medication this morning, but is insisting on being discharged as she no longer feels depressed. She fears for her child even though [name] is with her mother and that she feels they have not enough space for him and that there is no  heat in the house. She denies any substance use, but usually smokes some marijuana, indeed, she is positive for cannabis.   PAST PSYCHIATRIC HISTORY: Multiple psychiatric hospitalizations in the past. No suicide attempts.   FAMILY PSYCHIATRIC HISTORY: Mother with bipolar.   PAST MEDICAL HISTORY: Hypothyroidism, GERD, constipation.   ALLERGIES: ABILIFY, PENICILLIN, PERPHENAZINE, AND SULFA DRUGS.   MEDICATIONS ON ADMISSION: Navane 5 mg 3 times daily, Restoril 15 mg daily, senna 1 tablet twice daily, MiraLax as needed for constipation, pantoprazole 40 mg in the morning, lithium 300 mg 3 times daily, Synthroid 0.075 mg in the morning, Neurontin 100 mg 3 times daily, Colace 200 mg twice daily, Klonopin 0.25 mg 3 times daily, BuSpar 10 mg twice daily, Amantadine 100 mg twice daily.   SOCIAL HISTORY: She is married. She lives with her husband and a son in an apartment. This is a change from last hospitalization when they still used to live in a rented house. She is so psychotic that she thinks that she wants to divorce her husband. Usually they are a good team. 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 She has a 30-year-old son. She is a very good mother; however, the family has been struggling financially. They have no transportation and reportedly there was no food in the house, I imagine that they survive on food stamps.   REVIEW OF SYSTEMS: CONSTITUTIONAL: No fevers or chills. No weight changes.  EYES: No double or blurred vision.  ENT: No hearing loss.  RESPIRATORY:  No shortness of breath or cough.  CARDIOVASCULAR: No chest pain or orthopnea.  GASTROINTESTINAL: No abdominal pain, nausea, vomiting, or diarrhea.  GENITOURINARY: No incontinence or frequency.  ENDOCRINE: No heat or cold intolerance.  LYMPHATIC: No anemia or easy bruising.  INTEGUMENTARY: No acne or rash.  MUSCULOSKELETAL: No muscle or joint pain.  NEUROLOGIC: No tingling or  weakness.  PSYCHIATRIC: See history of present illness for details.   PHYSICAL EXAMINATION: VITAL SIGNS: Blood pressure 142/95, pulse 96, respirations 18, temperature 98.2.  GENERAL: This is a well-developed slender female in no acute distress.  HEENT: The pupils are equal, round, and reactive to light. Sclerae are anicteric.  NECK: Supple. No thyromegaly.  LUNGS: Clear to auscultation. No dullness to percussion.  HEART: Regular rhythm and rate. No murmurs, rubs, or gallops.  ABDOMEN: Soft, nontender, nondistended. Positive bowel sounds.  MUSCULOSKELETAL: Normal muscle strength in all extremities.  SKIN: No rashes or bruises.  LYMPHATIC: No cervical adenopathy.  NEUROLOGIC: Cranial nerves II through XII are intact.   LABORATORY DATA: Chemistries are within normal limits except for blood glucose of 127, blood alcohol level is zero, LFTs within normal limits. Urine tox screen positive for cannabinoids, CBC within normal limits except for white blood count of 15.3. Urinalysis is not suggestive of urinary tract infection. Serum acetaminophen and salicylate are low.   MENTAL STATUS EXAMINATION: On Admission, the patient is alert and oriented to person, place, time, and somewhat to situation. She is pleasant, polite and cooperative. She recognizes me from previous admissions. She maintains good eye contact. Her speech is slightly pressured, but she is well groomed and casually dressed. Mood is excellent with exuberant affect. Thought process is illogical, she denies thoughts of hurting herself or others. She is paranoid, delusional. She is actively hallucinating during the interview. She believes that Gerilyn Nestle is in the room and asks his permission to talk to me. Her cognition is grossly intact. Registration, recall, short and long-term memory are intact. She is of above average intelligence and fund of knowledge. Her insight and judgment are limited.   SUICIDE RISK ASSESSMENT ON ADMISSION: This is a  patient with long history of bipolar illness but no suicide attempts. She is a loving mother, a daughter, and wife but pretty psychotic and disorganized at present.   INITIAL DIAGNOSES:   AXIS I: Bipolar I disorder, mixed with psychosis, cannabis abuse.   AXIS II: Deferred.   AXIS III: Hypothyroidism, gastroesophageal reflux disease, constipation.   PLAN: The patient was admitted to [place] unit for safety, stabilization, and medication management. She was initially placed on suicide precautions and was closely monitored for any unsafe behaviors.  1.  Psychosis. We restarted Navane 5 mg 3 times daily and lithium 300 mg 3 times daily, lithium level not checked on admission, we will recheck in the morning.  2.  Medical. We will continue Synthroid for hypothyroidism, Protonix for gastroesophageal reflux disease; senna, MiraLax and Colace for constipation.  3.  Social. Social worker will contact child protective services and family to see how [name] is doing.  4.  Disposition. She will be discharged to home and follow up with [facility].    ____________________________ Wardell Honour. Bary Leriche, MD jbp:nt D: 12/30/2014 17:19:27 ET T: 12/30/2014 18:58:55 ET JOB#: [id number]  cc: Sherilynn Dieu B. Bary Leriche, MD, <Dictator> Clovis Fredrickson MD ELECTRONICALLY SIGNED 01/04/2015 0:58

## 2015-04-25 NOTE — Progress Notes (Signed)
[facility] MD Progress Note  04/25/2015 11:08 AM April Luna  MRN:  [id number] Subjective:  Today the patient was alert and oriented in person place time and situation. She described her mood as good.  Denies suicidality homicidality or auditory or visual hallucinations. She denies major problems with sleep appetite energy or concentration. Denies any side effects from her medications. Denies any physical complaints.  Patient says she is happy because her father passed back all her things from her apartment and put them in storage. Patient says she plans to go to a group home at discharge.  Principal Problem: Bipolar I disorder, most recent episode (or current) manic Diagnosis:   Patient Active Problem List   Diagnosis Date Noted   Bipolar I disorder, most recent episode (or current) manic [F31.10] 04/25/2015   Delirium due to another medical condition [F05] 04/25/2015   Cannabis abuse [F12.10] 04/25/2015   Total Time spent with patient: 30 minutes   Past Medical History: No past medical history on file. No past surgical history on file. Family History: No family history on file. Social History:  History  Alcohol Use: Not on file     History  Drug Use Not on file    History   Social History   Marital Status: Divorced    Spouse Name: N/A   Number of Children: N/A   Years of Education: N/A   Social History Main Topics   Smoking status: Not on file   Smokeless tobacco: Not on file   Alcohol Use: Not on file   Drug Use: Not on file   Sexual Activity: Not on file   Other Topics Concern   Not on file   Social History Narrative   No narrative on file   Additional History:    Sleep: Good  Appetite:  Good   Assessment:   Musculoskeletal: Strength & Muscle Tone: within normal limits Gait & Station: normal Patient leans: N/A   Psychiatric Specialty Exam: Physical Exam  Review of Systems  Respiratory: Negative for cough and shortness of breath.    Cardiovascular: Negative for chest pain.  Gastrointestinal: Negative for nausea, vomiting, abdominal pain, diarrhea and constipation.  Neurological: Negative for headaches.  Psychiatric/Behavioral: Negative for depression, suicidal ideas and hallucinations. The patient is not nervous/anxious and does not have insomnia.     Blood pressure 103/70, pulse 91, temperature 97.8 F (36.6 C), temperature source Oral, resp. rate 18, height 4\' 11"  (1.499 m), weight 59.875 kg (132 lb).Body mass index is 26.65 kg/(m^2).  General Appearance: Disheveled  Eye Contact::  Good  Speech:  Normal Rate  Volume:  Normal  Mood:  Euthymic  Affect:  Congruent  Thought Process:  Tangential  Orientation:  Full (Time, Place, and Person)  Thought Content:  denies SI, HI or A/VH  Suicidal Thoughts:  No  Homicidal Thoughts:  No  Memory:  Immediate;   Fair Recent;   Poor Remote;   Good  Judgement:  Fair  Insight:  Fair  Psychomotor Activity:  Decreased  Concentration:  Poor  Recall:  Poor  Fund of Knowledge:NA  Language: Good  Akathisia:  No  Handed:  Right  AIMS (if indicated):     Assets:  Communication Skills Desire for Improvement Physical Health  ADL's:  Impaired  Cognition: WNL  Sleep:  Number of Hours: 8     Current Medications: Current Facility-Administered Medications  Medication Dose Route Frequency Provider Last Rate Last Dose   acetaminophen (TYLENOL) tablet 650 mg  650 mg Oral  Q4H PRN Doctor Chlconversion, MD       haloperidol (HALDOL) tablet 2 mg  2 mg Oral Q8H PRN Doctor Chlconversion, MD       lactulose (CHRONULAC) 10 GM/15ML solution 10 g  10 g Oral QHS Doctor Chlconversion, MD       levothyroxine (SYNTHROID, LEVOTHROID) tablet 75 mcg  75 mcg Oral Q0600 Doctor Chlconversion, MD   75 mcg at 04/25/15 5188   magnesium hydroxide (MILK OF MAGNESIA) suspension 30 mL  30 mL Oral QHS PRN Doctor Chlconversion, MD       nicotine (NICOTROL) 10 MG inhaler 1 continuous puffing  1  continuous puffing Inhalation PRN Doctor Chlconversion, MD       paliperidone (INVEGA) 24 hr tablet 9 mg  9 mg Oral QHS Doctor Chlconversion, MD       pantoprazole (PROTONIX) EC tablet 40 mg  40 mg Oral Q0600 Doctor Chlconversion, MD   40 mg at 04/25/15 4166   traZODone (DESYREL) tablet 100 mg  100 mg Oral QHS PRN Doctor Chlconversion, MD        Lab Results: No results found for this or any previous visit (from the past 48 hour(s)).  Physical Findings: AIMS:  , ,  ,  ,    CIWA:    COWS:     Treatment Plan Summary: Daily contact with patient to assess and evaluate symptoms and progress in treatment   Bipolar disorder: continue invega ( mg po qhs.  Pt has received two inj of invega sustenna. Delirium: Patient appears to have better cognition since Friday.  Delirium appears to be resolving. For insomnia and continue trazodone 100 mg by mouth daily at bedtime when necessary For GERD continue Protonix 40 mg by mouth daily For hypothyroidism continue Synthroid 75 g daily For chronic constipation continue lactulose 15 mg by mouth daily Tobacco use disorder continue Nicotrol inhaler when necessary for nicotine cravings Discharge planning: Patient states she is in agreement with going to a group home at discharge. Will follow up with patient's social worker as at this point in time patient does not have Medicaid and is unknown if these will be possible.    Medical Decision Making:  Established Problem, Stable/Improving (1)     Merlyn Albert 04/25/2015, 11:08 AM

## 2015-04-25 NOTE — Progress Notes (Signed)
Patient's thoughts clear and goal-directed but at a slow pace. She states she is hoping to be discharged to a group home near where her parents live.  Denies SI/HI/AVH. She does endorse sadness and guilt about not being as good of a mother as she would have liked due to her mental illness. She hopes to regain custody of her 43-year-old son from her parents as soon as she is functioning better. No acute distress.

## 2015-04-25 NOTE — Consult Note (Signed)
Brief Consult Note: Diagnosis: bipolar/ psychotic.   Patient was seen by consultant.   Consult note dictated.   Recommend further assessment or treatment.   Orders entered.   Discussed with Attending MD.   Comments: PSychiatry: Note dictated. Orders done. No beds tonight. RE-eval for likely admission tomorrow.  Electronic Signatures: Gonzella Lex (MD)  (Signed 31-Mar-16 22:43)  Authored: Brief Consult Note   Last Updated: 31-Mar-16 22:43 by Gonzella Lex (MD)

## 2015-04-25 NOTE — Consult Note (Signed)
PATIENT NAME:  April Luna, April Luna MR#:  [id number] DATE OF BIRTH:  1971-03-10  DATE OF CONSULTATION:  12/29/2014  REFERRING PHYSICIAN:   CONSULTING PHYSICIAN:  Gonzella Lex, MD  IDENTIFYING INFORMATION AND REASON FOR CONSULTATION: This is a 44 year old woman with a history of bipolar disorder who presents to the hospital extremely depressed.   CHIEF COMPLAINT: "God told me that I would come to a bad end."   HISTORY OF PRESENT ILLNESS: Information obtained from the patient and the chart. The patient came to the Emergency Room extremely depressed. Family I believe had her brought in. She is not a great historian, but it sounds like she has been out of her medicine for 3 or 4 days and has decompensated badly. Mood and affect are extremely depressed. She is not eating very much. Not sleeping well. Has been having hallucinations, seeing things and hearing things. Says she hears horrible sounds. Tells me that God has told her that she will come to a bad end, also tells me that she believes that people have died because of her. She evidently has been off of all of her medicine, especially the lithium, as far as I can tell. She has DSS now involved in looking into her care of her 43-year-old son, but it is not clear if that preceded this current decompensation or came after it.   PAST PSYCHIATRIC HISTORY: History of bipolar disorder, several previous hospitalizations. Tells me that she has never actually tried to kill herself, but says that she wishes she had done so. She has a history of being frequently noncompliant with medicine. Lithium and Navane had been a combination that apparently had been working. She was last here in the hospital in the summer of this year.   FAMILY HISTORY: Positive family history for depression in 1 person with a psychotic disorder.   SOCIAL HISTORY: The patient is unable to work. She lives with her 64-year-old son. Extended family is around in the area. 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   PAST MEDICAL HISTORY: No significant ongoing medical problems.   SUBSTANCE ABUSE HISTORY: Denies any alcohol or drug use. There is no history of past substance abuse.   CURRENT MEDICATIONS: Appears to be off of everything, but she remembers having been taking lithium, buspirone and Navane, but cannot remember the doses.   ALLERGIES: ABILIFY, PENICILLIN, PERPHENAZINE AND SULFA DRUGS.   REVIEW OF SYSTEMS: The patient again is not a very good historian, but does not have any specific physical complaints. Just says she generally does not feel well. Thoughts about wishing she were dead and wanting to die. Talks about having hallucinations, seeing and hearing things.   MENTAL STATUS EXAMINATION: Disheveled woman who is lying face up, staring at the ceiling, barely moving. She is very passive, but I did manage to get some history out of her. Eye contact only intermittent. Psychomotor activity almost nonexistent. She barely moved during the conversation. Speech is extremely quiet such that I had to sit with my head a few inches from her mouth to hear what she was saying. Very decreased in amount. Thoughts are marked by paranoid delusions, negative delusions. Endorses auditory and visual hallucinations. Endorses suicidal ideation with a strong wish that she would just die. Believes that God is going to kill her and that things are hopeless anyway. No homicidal ideation. Repeated 3 words immediately, remembered 2 out of 3 at three minutes.  She is alert and oriented x4. Judgment and insight poor. Intelligence normal.   LABORATORY RESULTS: Salicylates, acetaminophen and alcohol all negative. Chemistry panel: Negative except for an elevated glucose of 127. White count elevated at 15, otherwise unremarkable CBC.   VITAL SIGNS: Blood pressure 135/90, respirations 20, pulse 94, temperature 98.1.   ASSESSMENT: A  44 year old woman with bipolar disorder, currently psychotic depression. Off of her medicine and decompensated. Says the ACT team did not fill her lithium like they were supposed to, but cannot give me anymore details than that. Clearly needs hospitalization because of intense suicidality, psychosis, inability to function.   TREATMENT PLAN: Admit to psychiatry. Restart medications based on previous orders including amantadine 100 mg twice a day, BuSpar 10 mg twice a day, Klonopin 0.25 mg 3 times a day, gabapentin 100 mg 3 times a day, levothyroxine 75 mcg a day, lithium 300 mg 3 times a day, Navane 5 mg 3 times a day, Restoril 15 mg at night.   TREATMENT PLAN: The patient will be on suicide and fall precautions. Psychoeducation and reviewed plan with the patient.   DIAGNOSIS, PRINCIPAL AND PRIMARY:  AXIS I: Bipolar disorder, currently depressed with psychotic features.   SECONDARY DIAGNOSES: AXIS I: No further.  AXIS II: No diagnosis.  AXIS III: Hypothyroidism, gastric reflux symptoms, constipation.  ____________________________ Gonzella Lex, MD jtc:sb D: 12/29/2014 16:01:01 ET T: 12/29/2014 16:13:07 ET JOB#: [id number]  cc: Gonzella Lex, MD, <Dictator> Gonzella Lex MD ELECTRONICALLY SIGNED 02/03/2015 17:18

## 2015-04-25 NOTE — Consult Note (Signed)
PATIENT NAME:  April Luna, MITCHNER MR#:  [id number] DATE OF BIRTH:  10-08-1971  DATE OF CONSULTATION:  03/26/2015  REFERRING PHYSICIAN:   CONSULTING PHYSICIAN:  Gonzella Lex, MD  IDENTIFYING INFORMATION AND HOSPITAL COURSE: A 44 year old woman with a history of bipolar disorder who presented to the hospital yesterday, psychotic. Follow-up evaluation today. On re-evaluation today, the patient is more awake, alert, and cooperative. She launches right into telling me that the problem is that she needs to have a tattoo of a parrot on one arm and a hot dog on the other arm. The level of rationality follows from the there. She is still very confused. She is able to tell me that she had moved out and had been living independently away from her husband and that consequently, she had not been taking her psychiatric medicine at all, recently. She has a little bit of insight into the fact that she is unwell, but continues to make bizarre psychotic statements, continuously. She has been compliant with medicine since being in the Emergency Room. She has not been violent or aggressive.   REVIEW OF SYSTEMS: No current medical complaints. Denies any GI, cardiac, or pulmonary complaints. Full physical 9-point review of systems negative. Mentally, she continues to describe auditory and visual hallucinations and is clearly delusional and bizarre in her thinking, but does not herself have any complaints and denies suicidal ideation.   MENTAL STATUS EXAMINATION: Disheveled woman, looks older than her stated age. Good eye contact. Normal psychomotor activity. Speech is rambling, but not necessarily pressured. Affect is smiling, upbeat, at times confused. Thoughts very disorganized and bizarre and loose. Indicates that she has been seeing her father poking his head through the window, recently. Frequently is responding to internal stimuli. Bizarre delusions as noted above. Denies suicidal or homicidal ideation. Alert and  oriented to her situation, basically. Further cognitive testing not completed again.   VITAL SIGNS: Blood pressure today 107/84, respirations 18, pulse 81, temperature 98.5.   ASSESSMENT: A 44 year old woman who is still psychotic. Not able to be discharged in this condition. Judgment and insight poor, too poor to take care of herself safely. Unfortunately, we have no beds available for her. We might later in the weekend. Continue current medication for now. She can be re-evaluated over the weekend and admitted, if that is possible. The patient informed of this and had no complaints.   DIAGNOSIS PRINCIPAL AND PRIMARY:  AXIS I: Bipolar disorder, manic with psychotic features.   SECONDARY DIAGNOSES: AXIS I: No further.  AXIS II: Hypothyroid, chronic constipation     ____________________________ Gonzella Lex, MD jtc:mw D: 03/26/2015 15:47:23 ET T: 03/26/2015 16:14:50 ET JOB#: [id number]  cc: Gonzella Lex, MD, <Dictator> Gonzella Lex MD ELECTRONICALLY SIGNED 03/30/2015 22:35

## 2015-04-25 NOTE — H&P (Signed)
PATIENT NAME:  April Luna, April Luna MR#:  [id number] DATE OF BIRTH:  [birth year]  DATE OF ADMISSION:  03/29/2015  IDENTIFYING INFORMATION:  A 44 year old married Caucasian female from [place], [place] has a diagnosis of bipolar disorder and is followed up by an ACT team.     CHIEF COMPLAINT: "I'm having a nervous breakdown."   HISTORY OF PRESENT ILLNESS:  This patient presented to our Emergency Department on 03/25/2015.  The patient was advised to come by her ACT team.  At presentation, the patient was very disorganized and her statements were not related to the questions asked. No information at this point in time is available from the ACT team, but it is likely that this patient has not been compliant with her outpatient regimen.  Today I attempted to interview Ms. [name] and she continues to be quite disorganized when I ask her what had brought her to the hospital. She started talking about some children that needed to be dropped-off in the morning.  The patient, however, denied suicidality, homicidality, or auditory or visual hallucination. She reported being compliant with her outpatient regimen, however, she could not tell me the names of the medications she was taking. She also tells me she follows up with an ACT team but does not know the name of agency that it is working with her. She thinks he might be [facility].  As far as substance abuse, the patient denies abusing any alcohol or illicit substances other than smoking marijuana a couple of times a month.  As far as nicotine, she states she smokes only a couple of cigarettes per day.   PAST PSYCHIATRIC HISTORY: This patient has had multiple hospitalizations in our facility. She has been admitted for both depressive and manic episodes in the past. She has a history of at least 1 suicidal attempt a few years ago.  She was discharged from our facility last in 01/12/2015. At that time, she was prescribed with lithium and Navane, which had been the  medications she usually responded best to.   PAST MEDICAL HISTORY: The patient suffers from hypothyroidism, which she takes Synthroid for.  She also complains of issues with constipation frequently.   FAMILY HISTORY: She denies knowing of any family history of mental illness.   SOCIAL HISTORY: The patient is known to be married and to have 88-year-old child. She reports that she has been staying in an apartment by herself for about a year and that her and her husband are fighting for the custody of the children.  She states that she is only going to be able to see the children on the weekends, and her husband is going to have them full time.  No other social history is known at this time.   ALLERGIES: ABILIFY, PENICILLIN, PERPHENAZINE AND SULFA.   REVIEW OF SYSTEMS: The patient's only complaint today is tremors and constipation. The rest of the 10 review of systems is negative.   MENTAL STATUS EXAMINATION: The patient is a 44 year old Caucasian female who appears her stated age. She displays limited grooming and hygiene, wearing hospital scrubs and looks disheveled. Her psychomotor activity is restless.  She has tremors.  Her eye contact was fair. Her speech had a regular tone, volume, and rate. Thought process disorganized, some answers were unrelated to the questions. Thought content was negative for suicidality, homicidality, or auditory or visual hallucinations. Her mood appears dysphoric and her affect is labile. Insight and judgment are impaired. Cognitive examination, she is alert and she is oriented  in person, place, time, and situation.    PHYSICAL EXAMINATION:  VITAL SIGNS: Blood pressure is 116/80, respirations 20, pulse 71, temperature is 98.3.  GENERAL: The patient is a 44 year old Caucasian female with significant tremors but not in acute distress.  MUSCULOSKELETAL: The patient had significant tremors on upper extremities. There is no evidence of poor balance or abnormalities in her  gait.  The rest of the physical examination were completed in our Emergency Department and it was within normal limits.   LABORATORY RESULTS: BUN 19, creatinine 0.93, sodium 137, potassium 3.5, calcium 9.8. Alcohol was below detection limit. AST was 18, ALT is 9, lithium was 1.25 today. Urine toxicology is positive for cannabis. CBC was, WBC 12.6, hemoglobin 13.2, hematocrit 39.2, platelet count 214,000.  Urinalysis was clear. Acetaminophen level less than 10, salicylate level less than 4.    DIAGNOSES:  AXIS I:   Bipolar disorder type I, current episode manic, severe. Cannabis use disorder, severe. Alcohol use disorder, moderate.  Constipation, gastroesophageal reflux disease.     ASSESSMENT: The patient is a 44 year old Caucasian female with multiple hospitalizations secondary to poor compliance with medications. The patient has a diagnosis of bipolar disorder and is followed up by an ACT team.  The patient presents to our hospital, very disorganized.  Currently her lithium level is 1.25 and she has significant tremors,  likely due to mild lithium toxicity.  At this point in time, I will continue the on navane and for right now I will discontinue the lithium and recheck the level tomorrow in the morning.   PLAN: For bipolar disorder, she will be continued on Diltiazem, Navane 10 mg 3 times a day. The lithium will be discontinued for right now as the level is 1.25 and she has confusion and significant tremors. It is possible also that the patient was presenting due to disorganization in the Emergency Department due to lithium toxicity.  However, when the lithium was checked in the Emergency Department, it was only 1.13.  For EPS prevention, she will be discontinued on benztropine 1 mg p.o. b.i.d.  For constipation she will be continued on Colace 200 mg b.i.d., MiraLax and Senna.  For insomnia, she will receive Ambien 5 mg p.o. at bedtime.  For her chronic pain issues, she will be continued on gabapentin  100 mg 3 times a day.     DISCHARGE DISPOSITION: Once stable, this patient will be discharged back to her home and she will continue to follow up with the ACT team.    LABORATORY: I will check a lithium level tomorrow morning. We will also check TSH.      ____________________________ Hildred Priest, MD ahg:DT D: 03/30/2015 14:45:09 ET T: 03/30/2015 15:31:17 ET JOB#: [id number]  cc: Hildred Priest, MD, <Dictator> Rhodia Albright MD ELECTRONICALLY SIGNED 04/05/2015 15:12

## 2015-04-25 NOTE — Plan of Care (Signed)
Problem: Alteration in thought process Goal: STG-Patient is able to discuss thoughts with staff Outcome: Progressing States she hopes to go to group home soon

## 2015-04-25 NOTE — [facility] Group Notes (Signed)
[facility] Group Notes:  (Nursing/MHT/Case Management/Adjunct)  Date:  04/25/2015  Time:  9:39 PM  Type of Therapy:  Group Therapy  Participation Level:  Did Not Attend  Participation Quality:  n/a  Affect:  n/a  Cognitive:  n/a  Insight:  None  Engagement in Group:  n/a  Modes of Intervention:  n/a  Summary of Progress/Problems:  [name] 04/25/2015, 9:39 PM

## 2015-04-25 NOTE — Consult Note (Signed)
PATIENT NAME:  April Luna, April Luna MR#:  [id number] DATE OF BIRTH:  02-Nov-1971  DATE OF CONSULTATION:  03/25/2015  CONSULTING PHYSICIAN:  Gonzella Lex, MD  IDENTIFYING INFORMATION AND REASON FOR CONSULTATION:  This is a 44 year old woman with a history of bipolar disorder, who was brought in to the Emergency Room evidently at least partially at the instigation of her ACT team.   CHIEF COMPLAINT:  "I'm here because I have too much electricity."   HISTORY OF PRESENT ILLNESS:  Currently, most of my history is obtained from the patient about this acute episode, and she is not a particularly good historian. She is fairly disorganized and has a hard time giving a coherent history. She tells me that she is currently living in the [facility] and has been living on her own independently for a few weeks. This would be an unusual change given that in the past, she had been living with her husband regularly. She is unable to tell me what medication she is taking and is unable to tell me whether there has been any change to any of them. She tells me that she takes "most" of her medicines. Denies that she uses any alcohol. She says that she smoked a little marijuana last week; she is a little vague about when it was. She tells me that she has been having visual hallucinations, which she cannot describe. Denies auditory hallucinations. The patient is quite disorganized and makes multiple bizarre statements. Her delusions are not particularly formed or coherent. The idea that she has too much electricity in her is actually one of the more coherent things that she said to me. She is not reporting feeling depressed. Denies any suicidal ideation. She tells me that she knows she has been sleeping and eating adequately. She clearly, however, is psychotic.   PAST PSYCHIATRIC HISTORY:  Multiple hospitalizations, long history of bipolar disorder. She has presented with both depressive and manic episodes in the  past. She has a history of at least one suicide attempt, but it was a few years ago, none since then. She had been discharged most recently from our hospital 01/12/2015, and at that time was on lithium and Navane, which have been stable medications for her.   SOCIAL HISTORY:  The patient is married and has a 34-year-old son. She did not mention her husband at all during the interview with me. She tells me that her son is living with her mother. She claims to be living on her own right now. Again, I am not sure how much to trust her current history. She does have ACT team involvement.   PAST MEDICAL HISTORY:  The patient has hypothyroidism for which she usually takes Synthroid. Chronic constipation at least in part related to her medication.   FAMILY HISTORY:  She denied to me knowing of any family history of mental illness.   SUBSTANCE ABUSE HISTORY:  She has a history of occasional abuse of marijuana, which usually causes her to decompensate.   CURRENT MEDICATIONS:  The patient was unable to tell me what she is taking currently. When she was discharged in January, she was taking gabapentin 100 mg 3 times a day, lithium carbonate 300 mg 3 times a day, pantoprazole 40 mg once a day, levothyroxine 75 mcg once a day, thiothixene 10 mg 3 times a day, buspirone 15 mg twice a day, Cogentin 1 mg twice a day, clonazepam 0.25 mg 3 times a day, senna tablets 1 twice a day, docusate  200 mg twice a day, MiraLax 17 grams as needed for constipation, Ambien 5 mg at night.   ALLERGIES:  ABILIFY, PENICILLIN, PERPHENAZINE, AND SULFA DRUGS.   REVIEW OF SYSTEMS:  The patient does not report to me anything that sounds like a believable physical symptom, and she does not appear to be in any distress. She says she is seeing things, which she cannot describe. Denies suicidal ideation.   MENTAL STATUS EXAMINATION:  Disheveled woman who looks her stated age, passively cooperative. Eye contact is okay as long as I sit directly  in front of her. Psychomotor activity is very limited. She remains in a semi fetal position on the stretcher throughout the interview. Speech is decreased in total amount and quiet in tone. Affect is flat. Mood is stated as okay. Thoughts are very disorganized, quite bizarre, and almost word salad on occasion. Denies hallucinations auditory, but endorses visual hallucinations, which are undescribed. Denies suicidal or homicidal ideation. Repeats 3 words immediately, remembers only 1 of them at 3 minutes. She is alert and oriented x 4. Judgment and insight are impaired by psychosis.   LABORATORY RESULTS:  Drug screen is positive for cannabis. Salicylates are negative acetaminophen is negative. Alcohol is negative. Chemistry panel:  No significant abnormality. CBC shows just a slightly elevated white count at 12.6. Urinalysis:  1+ leukocyte esterase, negative nitrates, positive for white cells, trace bacteria, positive for ketones.   VITAL SIGNS:  Blood pressure currently 97/73, respirations 18, pulse 82, temperature 98.5.   ASSESSMENT:  A 44 year old woman with bipolar disorder who is currently presenting not so much with obvious mood symptoms as being clearly psychotic. She is not a good historian, and I am lacking some information about her current situation. She is clearly not able to think clearly enough to take care of herself right now and needs hospital level treatment for stabilization.   TREATMENT PLAN:  Unfortunately, no beds are available on the ward at this time. We can probably admit to the hospital tomorrow depending on her mental state at that time. I will continue her usual medications as prescribed previously. The patient was informed of the plan and is completely in agreement.   DIAGNOSIS, PRINCIPAL AND PRIMARY:  AXIS I:  Bipolar disorder type 1, currently psychotic, probably manic.   SECONDARY DIAGNOSES: AXIS I:  Cannabis abuse, moderate.   AXIS II:  Deferred.   AXIS III:   Hypothyroid.    ____________________________ Gonzella Lex, MD jtc:nb D: 03/25/2015 22:41:57 ET T: 03/25/2015 23:01:37 ET JOB#: [id number]  cc: Gonzella Lex, MD, <Dictator> Gonzella Lex MD ELECTRONICALLY SIGNED 03/30/2015 22:35

## 2015-04-25 NOTE — Consult Note (Signed)
Referring Physician:  Hildred Priest :   Primary Care Physician:  Hildred Priest : [place], [address], [place], [place], [place] [address], [place] [phone]  Reason for Consult: Admit Date: 29-Mar-2015  Chief Complaint: confusion  Reason for Consult: confusion   History of Present Illness: History of Present Illness:   seen at request of Dr. Jerilee Hoh for confusion;  43 yo RHD F presents to [facility] secondary to altered mental status.  Pt has long history of bipolar disease and was on lithium for treatment.  Her levels were a little high on admission so this was thought to be the cause of her confusion but even when the levels have fallen pt continues to be confused.  Pt has had her psychiatric medications changed in order to help with confusion however they have not.  Nursing reports some jerky type movements by pt but no active seizures.  ROS:  Review of Systems   unobtainable secondary to confusion  Past Medical/Surgical Hx:  bipolar:   DM:   c section:   schizoaffective disorder:   oral surgery:   Past Medical/ Surgical Hx:  Past Medical History reviewed by me as above   Past Surgical History reviewed by me as above   Home Medications: Medication Instructions Last Modified Date/Time  pantoprazole 40 mg oral delayed release tablet 1 tab(s) orally once a day for acid reflux. 04-Apr-16 23:32  busPIRone 15 mg oral tablet 1 tab(s) orally 2 times a day for anxiety. 04-Apr-16 23:32  lithium 300 mg oral tablet, extended release 1 tab(s) orally three times daily for mood stabilization. 04-Apr-16 23:32  gabapentin 100 mg oral capsule 1 cap(s) orally 3 times a day for mood stabilization. 04-Apr-16 23:32  benztropine 1 mg oral tablet 1 tab(s) orally 2 times a day for stiffness. 04-Apr-16 23:32  thiothixene 10 mg oral capsule 1 cap(s) orally 3 times a day for psychosis. 04-Apr-16 23:32  clonazePAM 0.25 milligram(s) orally 3  times a day for anxiety. 04-Apr-16 23:32  senna 1 tab(s) orally 2 times a day for constipation. 04-Apr-16 23:32  docusate sodium 100 mg oral capsule 2 cap(s) orally 2 times a day for constipation. 04-Apr-16 23:32  polyethylene glycol 3350 oral powder for reconstitution 17 gram(s) orally once a day, As needed, constipation 04-Apr-16 23:32  levothyroxine 75 mcg (0.075 mg) oral tablet 1 tab(s) orally once a day for hypothyroidism. 04-Apr-16 23:32  zolpidem 5 mg oral tablet 1 tab(s) orally once a day (at bedtime) for sleep. 04-Apr-16 23:32  nicotine 10 mg inhalation device 1 dose(s) inhaled every 1 to 2 hours for smoking cessation. 04-Apr-16 23:32   Allergies:  PCN: GI Distress  Sulfa drugs: GI Distress  Abilify: Rash  Perphenazine: Unknown  Allergies:  Allergies reviewed by me as above   Social/Family History: Employment Status: unemployed  Lives With: alone  Living Arrangements: group home  Social History: + tob, occasional EtOH, no illicits  Family History: no seizures or stroke per chart   Vital Signs: **Vital Signs.:   13-Apr-16 14:03  Vital Signs Type Post Fall  Temperature Temperature (F) 98.4  Celsius 36.8  Pulse Pulse 96  Respirations Respirations 20  Systolic BP Systolic BP 956  Diastolic BP (mmHg) Diastolic BP (mmHg) 82  Pulse Ox % Pulse Ox % 97   Physical Exam: General: short stature but overweight, mild distress  HEENT: normocephalic, sclera nonicteric, oropharynx clear  Neck: supple, no JVD, no bruits  Chest: CTA B, no wheezing  Cardiac: RRR, no murmurs,  no edema, 2+ pulses  Extremities: no C/C/E, FROM   Neurologic Exam: Mental Status: alert and oriented to person only, intermittently follows but tangential thoughts so must be redirected often  Cranial Nerves: PERRLA, EOMI, nl VF to threat, face symmetric, tongue midline, shoulder shrug equal  Motor Exam: 5-/5 B, mild tremor, normal tone, no bradykinesia  Deep Tendon Reflexes: 1+/4 B, mute plantars  Sensory  Exam: intact to pain B  Coordination: F to N WNL, slightly wide based gait   Lab Results: Thyroid:  06-Apr-16 15:10   Thyroid Stimulating Hormone 2.030 (0.350-4.500 NOTE: New Reference Range  03/02/15)  Hepatic:  31-Mar-16 17:01   Bilirubin, Total 1.1 (0.3-1.2 NOTE: New Reference Range  03/02/15)  Alkaline Phosphatase 69 (38-126 NOTE: New Reference Range  03/02/15)  SGPT (ALT)  9 (14-54 NOTE: New Reference Range  03/02/15)  SGOT (AST) 18 (15-41 NOTE: New Reference Range  03/02/15)  Total Protein, Serum 7.5 (6.5-8.1 NOTE: New Reference Range  03/02/15)  Albumin, Serum 4.6 (3.5-5.0 NOTE: New reference range  03/02/15)  TDMs:  06-Apr-16 15:10   Lithium, Serum  0.54 (Result(s) reported on 31 Mar 2015 at 04:11PM.)  Routine Micro:  11-Apr-16 11:01   Micro Text Report HIV 1/2 AG AB COMBO   HIV 1/2 ANTIBODIES        NON-REACTIVE ANTIBODY   HIV-1 p24 ANTIGEN         NON-REACTIVE ANTIGEN   INTERPRETATION            NONREACTIVE.  A NONREACTIVE test result means that HIV-1 or HIV-2 antibodies and HIV-1 p24 antigen were not detected in the specimen.   ANTIBIOTIC                       General Ref:  31-Mar-16 17:01   Acetaminophen, Serum <10 (10-30 NOTE: New Reference Range  84/16/60)  Salicylates, Serum <4 (2.8-30.0 Therapeutic < 30.0 mg/dL TOXIC >29.9 mg/dL NOTE: New Reference Range:  03/02/15)  11-Apr-16 11:01   RPR w/ Reflex to Qt RPR and Conf. TP-PA ========== TEST NAME ==========  ========= RESULTS =========  = REFERENCE RANGE =  RPR W/REFLX QN,CONF TPPA  RPR, Rfx Qn RPR/Confirm TP RPR                             [   Non Reactive         ]      Non Reactive               [facility]            No: [id number]           [address] [place], [place] [address]           Lindon Romp, MD         [phone]   Result(s) reported on 06 Apr 2015 at 07:48AM.  Routine Chem:  31-Mar-16 17:01   Ethanol, S. <5 (0-0 NOTE: New Reference Range:   03/02/15)  11-Apr-16 11:01   Ammonia, Plasma  37 (9-35 NOTE: New Reference Range  03/02/15)  12-Apr-16 20:26   Glucose, Serum  123 (65-99 NOTE: New Reference Range  03/02/15)  BUN 15 (6-20 NOTE: New Reference Range  03/02/15)  Creatinine (comp) 0.59 (0.44-1.00 NOTE: New Reference Range  03/02/15)  Sodium, Serum 141 (135-145 NOTE: New Reference Range  03/02/15)  Potassium, Serum 4.0 (3.5-5.1 NOTE: New Reference Range  03/02/15)  Chloride, Serum 108 (101-111 NOTE: New Reference Range  03/02/15)  CO2, Serum 26 (22-32 NOTE: New Reference Range  03/02/15)  Calcium (Total), Serum 9.2 (8.9-10.3 NOTE: New Reference Range  03/02/15)  Anion Gap 7  eGFR (African American) >60  eGFR (Non-African American) >60 (eGFR values <69mL/min/1.73 m2 may be an indication of chronic kidney disease (CKD). Calculated eGFR is useful in patients with stable renal function. The eGFR calculation will not be reliable in acutely ill patients when serum creatinine is changing rapidly. It is not useful in patients on dialysis. The eGFR calculation may not be applicable to patients at the low and high extremes of body sizes, pregnant women, and vegetarians.)  Urine Drugs:  [id number] 73:53   Tricyclic Antidepressant, Ur Qual (comp) NEGATIVE (Result(s) reported on 25 Mar 2015 at 05:59PM.)  Amphetamines, Urine Qual. NEGATIVE  MDMA, Urine Qual. NEGATIVE  Cocaine Metabolite, Urine Qual. NEGATIVE  Opiate, Urine qual NEGATIVE  Phencyclidine, Urine Qual. NEGATIVE  Cannabinoid, Urine Qual. POSITIVE  Barbiturates, Urine Qual. NEGATIVE  Benzodiazepine, Urine Qual. NEGATIVE (------------------ The URINE DRUG SCREEN provides only a preliminary, unconfirmed analytical test result and should not be used for non-medical purposes.  Clinical consideration and professional judgment should be applied to any positive drug screen result due to possible interfering substances.  A more specific alternate chemical  method must be used in order to obtain a confirmed analytical result.  Gas chromatography/mass spectrometry (GC/MS) is the preferred confirmatory method.)  Methadone, Urine Qual. NEGATIVE  Routine UA:  31-Mar-16 17:01   Color (UA) Yellow  Clarity (UA) Cloudy  Glucose (UA) Negative  Bilirubin (UA) Negative  Ketones (UA) 1+  Specific Gravity (UA) 1.014  Blood (UA) 1+  pH (UA) 6.0  Protein (UA) 30 mg/dL  Nitrite (UA) Negative  Leukocyte Esterase (UA) 1+ (Result(s) reported on 25 Mar 2015 at 06:00PM.)  RBC (UA) 10 /HPF  WBC (UA) 24 /HPF  Bacteria (UA) TRACE  Epithelial Cells (UA) 14 /HPF  Mucous (UA) PRESENT (Result(s) reported on 25 Mar 2015 at 06:00PM.)  Routine Hem:  12-Apr-16 20:26   WBC (CBC)  11.3  RBC (CBC) 3.88  Hemoglobin (CBC) 12.4  Hematocrit (CBC) 37.2  Platelet Count (CBC) 191  MCV 96  MCH 31.9  MCHC 33.2  RDW 12.2  Neutrophil % 63.3  Lymphocyte % 27.7  Monocyte % 6.3  Eosinophil % 1.3  Basophil % 1.4  Neutrophil #  7.2  Lymphocyte # 3.1  Monocyte # 0.7  Eosinophil # 0.1  Basophil #  0.2 (Result(s) reported on 06 Apr 2015 at 08:46PM.)   Radiology Results: CT:    12-Apr-16 03:35, CT Head Without Contrast  CT Head Without Contrast   REASON FOR EXAM:    Fall r/o head injury  COMMENTS:       PROCEDURE: CT  - CT HEAD WITHOUT CONTRAST  - Apr 06 2015  3:35AM     CLINICAL DATA:  Psychiatric patient, fell on face onto floor.  Confusion.    EXAM:  CT HEAD WITHOUT CONTRAST    TECHNIQUE:  Contiguous axial images were obtained from the base of the skull  through the vertex without intravenous contrast.  COMPARISON:  None.    FINDINGS:  Mild motion degraded examination.    The ventricles and sulci are normal. No intraparenchymal hemorrhage,  mass effect nor midline shift. No acute large vascular territory  infarcts.    No abnormal extra-axial fluid collections. Basal cisterns are  patent.    No skull fracture. The  included ocular globes and  orbital contents  are non-suspicious. Mildmaxillary sinus mucosal thickening with  small RIGHT maxillary sinus air fluid level. The mastoid air cells  are well aerated.     IMPRESSION:  Normal mildly motion degraded noncontrast CT of the head.    Mild acute paranasal sinusitis.      Electronically Signed    By: Elon Alas    On: 04/06/2015 03:48         Verified By: Ricky Ala, M.D.,    12-Apr-16 13:50, CT Head Without Contrast  CT Head Without Contrast   REASON FOR EXAM:    patient had a second fall this am with head trauma.  COMMENTS:       PROCEDURE: CT  - CT HEAD WITHOUT CONTRAST  - Apr 06 2015  1:50PM     CLINICAL DATA:  Confusion. Patient had 2 falls this morning with  head trauma. Patient has been disoriented. Patient struck posterior  aspect the head and right side of the head.    EXAM:  CT HEAD WITHOUT CONTRAST    TECHNIQUE:  Contiguous axial images were obtained from the base of the skull  through the vertex without intravenous contrast.  COMPARISON:  04/06/2015    FINDINGS:  There is mild patient motion artifact. There is no intra or  extra-axial fluid collection or mass lesion. The basilar cisterns  and ventricles have a normal appearance. There is no CT evidence for  acute infarction or hemorrhage. Bone windows show mild mucosal  thickening of the maxillary sinuses. No air-fluid levels. There is  right posterior parietal scalp edema. No calvarial fracture.     IMPRESSION:  1.  No evidence for acute intracranial abnormality.  2. Mild chronic maxillary sinusitis.  3. Right posterior parietal scalp edema. No underlying calvarial  fracture.  Electronically Signed    By: Nolon Nations M.D.    On: 04/06/2015 15:38         Verified By: Glenice Bow, M.D.,   Radiology Impression: Radiology Impression: CT of head personally reviewed by me and normal   Impression/Recommendations: Recommendations:   prior notes reviewed by  me reviewed by me   Possible mild encephalopathy-  pt does have fever which could be from infection, doubt NMS on exam now.  Pt could also have some liver dysfunction causing this as well.  Will look for other toxic/metabolic causes as well.  Partial seizures could do this as well.   Bipolar-  does not seem to be controlled now as pt has extremely delusional thoughts and this is her primary issue.  Be aware that pt was just on Tegretol which will lower the dosage received of Thiothixene Fever-  unsure if medication or infection related EEG check LFT's, ammonia, TSH, ESR, CBC discontinue Neurotin  would pan-culture if not already done would decrease Buspar to 77m BID as well due to it lowering seizure threshold will follow briefly  Electronic Signatures: SJamison Neighbor(MD)  (Signed 13-Apr-16 15:32)  Authored: REFERRING PHYSICIAN, Primary Care Physician, Consult, History of Present Illness, Review of Systems, PAST MEDICAL/SURGICAL HISTORY, HOME MEDICATIONS, ALLERGIES, Social/Family History, NURSING VITAL SIGNS, Physical Exam-, LAB RESULTS, RADIOLOGY RESULTS, Recommendations   Last Updated: 13-Apr-16 15:32 by SJamison Neighbor(MD)

## 2015-04-25 NOTE — Consult Note (Signed)
History of Present Illness:  History of Present Illness A 44 year old woman with a history of bipolar disorder who presented  due to worsening paranoia. She was seen in ED BHU. Remains disorganized, with tangential thoughts. Stated that she came here due to a magical game and she has been here for the past 10 days. She stated that she follows with Dr Mamie Nick and  she prescribed her medications. Talking about Peanut Butter on her hand and stated that Dr Mamie Nick will understand. She stated that she is concerned about 36 years old son, who will be staying with father. Remains disorganized with poor insight.   Target Symptoms:  Psychosis Delusions  Disorganization   Arousal/Cognitive Decreased Concentration   PAST MEDICAL & SURGICAL HX:  Significant Events:   bipolar:    DM:    c section:    schizoaffective disorder:    oral surgery:   CURRENT OUTPATIENT MEDICATIONS:  Home Medications: Medication Instructions Status  pantoprazole 40 mg oral delayed release tablet 1 tab(s) orally once a day for acid reflux. Active  busPIRone 15 mg oral tablet 1 tab(s) orally 2 times a day for anxiety. Active  lithium 300 mg oral tablet, extended release 1 tab(s) orally three times daily for mood stabilization. Active  gabapentin 100 mg oral capsule 1 cap(s) orally 3 times a day for mood stabilization. Active  benztropine 1 mg oral tablet 1 tab(s) orally 2 times a day for stiffness. Active  thiothixene 10 mg oral capsule 1 cap(s) orally 3 times a day for psychosis. Active  clonazePAM 0.25 milligram(s) orally 3 times a day for anxiety. Active  senna 1 tab(s) orally 2 times a day for constipation. Active  docusate sodium 100 mg oral capsule 2 cap(s) orally 2 times a day for constipation. Active  polyethylene glycol 3350 oral powder for reconstitution 17 gram(s) orally once a day, As needed, constipation Active  levothyroxine 75 mcg (0.075 mg) oral tablet 1 tab(s) orally once a day for hypothyroidism. Active  zolpidem  5 mg oral tablet 1 tab(s) orally once a day (at bedtime) for sleep. Active  nicotine 10 mg inhalation device 1 dose(s) inhaled every 1 to 2 hours for smoking cessation. Active   Mental Status Exam:  Mood Anxious   Affect Tearful   Thought Processes Tangential   Thought Content Delusions   Orientation Place  Time  Person   Attention Oriented   Memory Intact   Fund of Knowledge Good   Review of Systems:  Review of Systems:  Medications/Allergies Reviewed Medications/Allergies reviewed   NURSING FLOWSHEETS:  Vital Signs/Nurse Notes-CM: ED Vital Sign Flow Sheet:   04-Apr-16 08:24  Temp Temperature 98.1  Temp Source oral  Pulse Pulse 85  Respirations Respirations 20  SBP SBP 105  DBP DBP 71  Pulse Ox % Pulse Ox % 100  Pulse Ox Source Source Room Air  Pain Scale (0-10) Pain Scale (0-10) Scale:0   Assessment & Diagnosis: Axis I: Schizoaffective Disorder, Bipolar Type.  Treatment Plan: Pt admitted to Inpt unit for stabilization and safety Will continue her meds.  Electronic Signatures: Jeronimo Norma (MD)  (Signed 04-Apr-16 12:34)  Authored: History of Present Illness, Target Symptoms, PAST MEDICAL & SURGICAL HX, CURRENT OUTPATIENT MEDICATIONS, Mental Status Exam, Review of Systems, NURSING FLOWSHEETS, Assessment & Diagnosis, Treatment Plan   Last Updated: 04-Apr-16 12:34 by Jeronimo Norma (MD)

## 2015-04-26 ENCOUNTER — Encounter: Payer: Self-pay | Admitting: Psychiatry

## 2015-04-26 DIAGNOSIS — E039 Hypothyroidism, unspecified: Secondary | ICD-10-CM | POA: Diagnosis present

## 2015-04-26 NOTE — Progress Notes (Signed)
Recreation Therapy Notes  INPATIENT RECREATION THERAPY ASSESSMENT  Patient Details Name: April Luna MRN: [id number] DOB: [birth year] Today's Date: 04/26/2015   Assessment was completed 04.05.16 at 11:45 am.  Patient Stressors:  Family, Relationship, Friends, and Other (son being with husband is stressful)  Coping Skills:    Isolation, Arguments, Substance abuse, Avoidance, Art/dance, and Music   Personal Challenges:  Anger, Communication, Concentration, Decision-making, Expressing yourself, Problem-solving, Relationships, Self-esteem (7/10), Social interaction, Time management, and Trusting others  Leisure Interests (2+):   "Having conversations with Chewbacca.", Coloring, Movies, Being outside  Awareness of Community Resources:   Yes  Community Resources:   Psychologist, counselling  Current Use:  Yes  If no, Barriers?:    Patient Strengths:   "I can see people's sorrows."  Patient Identified Areas of Improvement:   "I would like a soda pop."  Current Recreation Participation:   Listen to music  Patient Goal for Hospitalization:   Doesn't have a goal  [place] of Residence:   [place] of Residence:      Current SI (including self-harm):   No  Current HI:   No  Consent to Intern Participation:  N/A   Leonette Monarch, LRT/CTRS 04/26/2015, 5:16 PM

## 2015-04-26 NOTE — [facility] Group Notes (Signed)
[facility] Group Notes:  (Nursing/MHT/Case Management/Adjunct)  Date:  04/26/2015  Time:  6:01 PM  Type of Therapy:  recreation  Participation Level:  Active  Participation Quality:  Appropriate  Affect:  Appropriate  Cognitive:  Appropriate  Insight:  Appropriate  Engagement in Group:  Engaged  Modes of Intervention:  Socialization  Summary of Progress/Problems:  April Luna 04/26/2015, 6:01 PM

## 2015-04-26 NOTE — Progress Notes (Signed)
Pt visible on the unit. Seems hopeful she will get a group home soon. Med compliant. Attended group. Appears to be resting at this time.

## 2015-04-26 NOTE — Plan of Care (Signed)
Problem: Alteration in thought process Goal: LTG-Patient behavior demonstrates decreased signs psychosis (Patient behavior demonstrates decreased signs of psychosis to the point the patient is safe to return home and continue treatment in an outpatient setting.)  Outcome: Progressing Pt thoughts clear. No psychosis noted

## 2015-04-26 NOTE — Progress Notes (Signed)
Recreation Therapy Group Note Template    Date: [id number] Time: 3:00 PM Location: Craft Room  Group Topic: Self-Expression  Goal Area(s) Addresses:  Patient will identify one color per emotion listed on wheel. Patient will verbalize benefit of using art as a means of self-expression. Patient will verbalize one emotion experienced during session. Patient will be educated on other forms of self-expression.  Behavioral Response: Attentive, Interactive  Intervention: Emotion Wheel  Activity: Patients were given a worksheet with 7 different emotions. Patients were instructed to pick a color for each emotion and color the designated section.   Education: LRT educated patients on self-expression and different forms of self-expression.   Education Outcome: Acknowledges education/In group clarification offered  Clinical Observations/Feedback: Patient completed activity by picking colors for each emotion and drawing symbols for each emotion. Patient contributed to group discussion by stating what colors she picked for each emotion and why she picked those colors.       Leonette Monarch, LRT/CTRS 04/26/2015 4:08 PM

## 2015-04-26 NOTE — Progress Notes (Signed)
[facility] MD Progress Note  04/26/2015 2:41 PM April Luna  MRN:  [id number] Subjective:  Today the patient was alert and oriented in person place time and situation. She described her mood as good.  Denies suicidality homicidality or auditory or visual hallucinations. She denies major problems with sleep appetite energy or concentration. Denies any side effects from her medications. Denies any physical complaints.  Continues to report desire to go to a group home at discharge.  Principal Problem: Bipolar I disorder, most recent episode (or current) manic Diagnosis:   Patient Active Problem List   Diagnosis Date Noted   Hypothyroidism [E03.9] 04/26/2015   Bipolar I disorder, most recent episode (or current) manic [F31.10] 04/25/2015   Delirium due to another medical condition [F05] 04/25/2015   Cannabis abuse [F12.10] 04/25/2015   Total Time spent with patient: 30 minutes   Past Medical History:  Past Medical History  Diagnosis Date   Hypothyroidism    No past surgical history on file. Family History: No family history on file. Social History:  History  Alcohol Use No     History  Drug Use   Yes   Special: Marijuana    History   Social History   Marital Status: Divorced    Spouse Name: N/A   Number of Children: N/A   Years of Education: N/A   Social History Main Topics   Smoking status: Current Every Day Smoker   Smokeless tobacco: Not on file   Alcohol Use: No   Drug Use: Yes    Special: Marijuana   Sexual Activity: Not on file   Other Topics Concern   Not on file   Social History Narrative   No narrative on file   Additional History:    Sleep: Good  Appetite:  Good   Assessment:   Musculoskeletal: Strength & Muscle Tone: within normal limits Gait & Station: normal Patient leans: N/A   Psychiatric Specialty Exam: Physical Exam   Review of Systems  Respiratory: Negative for cough.   Gastrointestinal: Negative for abdominal  pain, diarrhea and constipation.  Neurological: Negative for headaches.  Psychiatric/Behavioral: Negative for depression, suicidal ideas and hallucinations. The patient does not have insomnia.     Blood pressure 103/70, pulse 91, temperature 98.6 F (37 C), temperature source Oral, resp. rate 18, height 4\' 11"  (1.499 m), weight 59.875 kg (132 lb).Body mass index is 26.65 kg/(m^2).  General Appearance: Disheveled  Eye Contact::  Good  Speech:  Normal Rate  Volume:  Normal  Mood:  Euthymic  Affect:  Congruent  Thought Process:  Tangential  Orientation:  Full (Time, Place, and Person)  Thought Content:  denies SI, HI or A/VH  Suicidal Thoughts:  No  Homicidal Thoughts:  No  Memory:  Immediate;   Fair Recent;   Poor Remote;   Good  Judgement:  Fair  Insight:  Fair  Psychomotor Activity:  Decreased  Concentration:  Poor  Recall:  Poor  Fund of Knowledge:NA  Language: Good  Akathisia:  No  Handed:  Right  AIMS (if indicated):     Assets:  Communication Skills Desire for Improvement Physical Health  ADL's:  Impaired  Cognition: WNL  Sleep:  Number of Hours: 8.75     Current Medications: Current Facility-Administered Medications  Medication Dose Route Frequency Provider Last Rate Last Dose   acetaminophen (TYLENOL) tablet 650 mg  650 mg Oral Q4H PRN Doctor Chlconversion, MD       haloperidol (HALDOL) tablet 2 mg  2 mg Oral  Q8H PRN Doctor Chlconversion, MD   2 mg at 04/25/15 1730   lactulose (CHRONULAC) 10 GM/15ML solution 10 g  10 g Oral QHS Doctor Chlconversion, MD   10 g at 04/25/15 2116   levothyroxine (SYNTHROID, LEVOTHROID) tablet 75 mcg  75 mcg Oral Q0600 Doctor Chlconversion, MD   75 mcg at 04/26/15 0617   magnesium hydroxide (MILK OF MAGNESIA) suspension 30 mL  30 mL Oral QHS PRN Doctor Chlconversion, MD       nicotine (NICOTROL) 10 MG inhaler 1 continuous puffing  1 continuous puffing Inhalation PRN Doctor Chlconversion, MD       paliperidone (INVEGA) 24 hr  tablet 9 mg  9 mg Oral QHS Doctor Chlconversion, MD   9 mg at 04/25/15 2115   pantoprazole (PROTONIX) EC tablet 40 mg  40 mg Oral Q0600 Doctor Chlconversion, MD   40 mg at 04/26/15 0617   traZODone (DESYREL) tablet 100 mg  100 mg Oral QHS PRN Doctor Chlconversion, MD   100 mg at 04/25/15 2115    Lab Results: No results found for this or any previous visit (from the past 48 hour(s)).  Physical Findings: AIMS:  , ,  ,  ,    CIWA:    COWS:     Treatment Plan Summary: Daily contact with patient to assess and evaluate symptoms and progress in treatment   Bipolar disorder: continue invega 9 mg po qhs.  Pt has received two inj of invega sustenna.  Delirium: Patient appears to have better cognition since Friday.  Delirium appears to be resolving.  For insomnia and continue trazodone 100 mg by mouth daily at bedtime when necessary  For GERD continue Protonix 40 mg by mouth daily  For hypothyroidism continue Synthroid 75 g daily  For chronic constipation continue lactulose 15 mg by mouth daily  Tobacco use disorder continue Nicotrol inhaler when necessary for nicotine cravings  Discharge planning: Special assistance Medicaid social worker evaluated the patient today to see if there could be  approvable for special funding for placement.  Patient lost her apartment during these hospital stay she is currently homeless. It is my understanding that the patient is unable to stay with family members as they are now in the custody of her child. Appears patient cannot be in the same location as her child.  Unable to discharge as there is no safe discharge plan.   Medical Decision Making:  Established Problem, Stable/Improving (1)     Merlyn Albert 04/26/2015, 2:41 PM

## 2015-04-26 NOTE — BHH Group Notes (Signed)
[place] Group Notes:  (Nursing/MHT/Case Management/Adjunct)  Date:  04/26/2015  Time:  11:50 PM  Type of Therapy:  Group Therapy  Participation Level:  Active  Participation Quality:  Appropriate and Supportive  Affect:  Appropriate  Cognitive:  Alert  Insight:  Appropriate  Engagement in Group:  Developing/Improving  Modes of Intervention:  Education  Summary of Progress/Problems:  April Luna 04/26/2015, 11:50 PM

## 2015-04-26 NOTE — BHH Group Notes (Signed)
[facility] Treatment PsychoEd Group Attended and participated appropriately. Discussed need to continue to take medication and become involved in with social services to regain custody of her child.

## 2015-04-26 NOTE — Progress Notes (Signed)
Nutrition Follow-up  DOCUMENTATION CODES:  INTERVENTION: Meals and Snacks: Cater to patient preferences   NUTRITION DIAGNOSIS: No nutrition concerns    GOAL:  Energy intake: goal for patient to meet >90% of estimated needs  MONITOR: PO Intake  REASON FOR ASSESSMENT:  Other (Comment) (Follow Up)    ASSESSMENT:   Typical Fluid/ Food Intake: 100% of most meals recorded per I/O  Labs: Reviewed Meds: Reviewed  UOP: Reviewed Digestive System: Reviewed    Height:  Ht Readings from Last 1 Encounters:  04/23/15 4\' 11"  (1.499 m)    Weight:  Wt Readings from Last 1 Encounters:  04/23/15 132 lb (59.875 kg)    Ideal Body Weight:     Wt Readings from Last 10 Encounters:  04/23/15 132 lb (59.875 kg)    BMI:  Body mass index is 26.65 kg/(m^2).   Skin:  No concerns  Diet Order:  Diet regular Room service appropriate?: No; Fluid consistency:: Thin  EDUCATION NEEDS:   Intake/Output Summary (Last 24 hours) at 04/26/15 1605 Last data filed at 04/26/15 1257  Gross per 24 hour  Intake    720 ml  Output      1 ml  Net    719 ml    Last BM:  Reviewed  Roda Shutters, RDN [phone]  LOW Care Level

## 2015-04-26 NOTE — Progress Notes (Signed)
Patient states she slept good last night . Required no medication. Stated it working on herself placement in group home  Voice of appetite good. Energy level normal. Voice of Depression  0, Hopelessness 0 , and Anxiety 0, (low 0-10 high ). Patient  denies suicidal thoughts  . Patient denies auditory hallucinations. Patient attending  Unit programming.

## 2015-04-26 NOTE — Progress Notes (Signed)
[place] SW Met with patient earlier who stated she is not really that successful with finding family care homes. [place] SW agreed to meet with patient to assist. [place] SW called Vance Gather  From Medicaid came and met with pt to complete Special assistance form and to talk about financial resources. Patient was seen and Vance Gather will call with an update once documents are sent in.

## 2015-04-26 NOTE — Progress Notes (Signed)
Pt visible on the unit. Quiet. Denies suicidal thoughts. Pt thoughts clear. Med compliant. Trazodone for sleep. Appears to be sleeping at this time.

## 2015-04-26 NOTE — Plan of Care (Signed)
Problem: Alteration in thought process Goal: LTG-Patient behavior demonstrates decreased signs psychosis (Patient behavior demonstrates decreased signs of psychosis to the point the patient is safe to return home and continue treatment in an outpatient setting.)  Outcome: Progressing Able to state goal for discharge  Placement group home Goal: LTG-Patient is able to perceive the environment accurately Outcome: Progressing Oriented X4 Goal: LTG-Patient verbalizes understanding importance med regimen (Patient verbalizes understanding of importance of medication regimen and need to continue outpatient care.)  Outcome: Progressing Compliant with ,verbalize understanding Goal: STG-Patient is able to discuss thoughts with staff Outcome: Progressing Verbalization during groups Goal: STG-Patient does not respond to command hallucinations Outcome: Progressing Denies auditory hallucinations

## 2015-04-26 NOTE — Progress Notes (Signed)
Patient seen by ACT Team representative this shift

## 2015-04-26 NOTE — Progress Notes (Signed)
Dr. Irish Elders  Neurology notified at [phone] by GF RN

## 2015-04-26 NOTE — BHH Group Notes (Signed)
[place] Group Notes:  (Nursing/MHT/Case Management/Adjunct)  Date:  04/26/2015  Time:  12:14 PM  Type of Therapy:  Psychoeducational Skills  Participation Level:  Minimal  Participation Quality:  Pt. left at the beginning of group and came back at the end of group.   Affect:  Flat  Cognitive:  Lacking  Insight:  None  Engagement in Group:  Poor  Modes of Intervention:  Discussion and Education  Summary of Progress/Problems:  Adela Lank [facility] 04/26/2015, 12:14 PM

## 2015-04-26 NOTE — Progress Notes (Signed)
[place] SW was informed Estef[place] had a brief visit from [place] team. They are willing to assist with possible housing. In consult with patient and psychiatrist [facility] SW will contact family for temporary housing solution, until new supported housing could be found. Called sister Orvilla Fus the family is providing care to her son there is no child protection issues, the sister has temporary custody and it will be reviewed  approx June 26th  Custody will be reviewed again. Loretha Brasil Act team [phone] and left message to call back in morning.

## 2015-04-27 NOTE — Plan of Care (Signed)
Problem: [facility] Participation in Recreation Therapeutic Interventions Goal: STG-Patient will demonstrate improved self esteem by identif STG: Self-Esteem - Within 6 treatment session, patient will verbalize at least 5 positive positive affirmation statements in each of 3 treatment sessions to increase self-esteem post d/c.  Outcome: Progressing Treatment session 1: At approximately 9:30 am, LRT met with patient in patient room. Patient verbalized 5 positive affirmation statements. Patient reported it felt "good". LRT encouraged patient to continue saying positive affirmation statements.  Leonette Monarch, LRT/CTRS 05.03.16 12:05 pm

## 2015-04-27 NOTE — Plan of Care (Signed)
Problem: Alteration in thought process Goal: LTG-Patient behavior demonstrates decreased signs psychosis (Patient behavior demonstrates decreased signs of psychosis to the point the patient is safe to return home and continue treatment in an outpatient setting.)  Outcome: Progressing Pt oriented x 4. Thoughts organized. No signs of pyschosis

## 2015-04-27 NOTE — BHH Group Notes (Signed)
Adult Psychoeducational Group Note  Date:  04/27/2015 Time:  9:22 AM  Group Topic/Focus:  Overcoming Stress:   The focus of this group is to define stress and help patients assess their triggers.  Participation Level:  Active  Participation Quality:  Sharing  Affect:  Flat  Cognitive:  Appropriate  Insight: Improving  Engagement in Group:  Engaged  Modes of Intervention:  Problem-solving  Additional Comments:  CSW introduced self and read group rules. CSW asked group members to introduce themselves and share their "Dream Vacation" destination. CSW introduced group topic of "Overcoming Obstacles" and facilitated a group discussion.  Pt introduced herself and shared that her vacation spot would be "[place], great beaches, would love to see [place], great vacation spots". Pt participated appropriately in group and shared that she recently lost her apartment but has a place to go but hopes to find a group home soon. Pt shared her negative feelings toward having a reoccurring episode due to her mental health diagnosis. Pt shared that being mentally ill has caused a lot of hardship in her life that she has no control over.   Carmell Austria T 04/27/2015, 9:22 AM

## 2015-04-27 NOTE — BHH Group Notes (Signed)
[facility] Group Notes:  (Nursing/MHT/Case Management/Adjunct)  Date:  04/27/2015  Time:  9:22 PM  Type of Therapy:  Group Therapy  Participation Level:  Active  Participation Quality:  Appropriate  Affect:  Appropriate  Cognitive:  Appropriate  Insight:  Appropriate  Engagement in Group:  Engaged  Modes of Intervention:  Education  Summary of Progress/Problems:  [name] 04/27/2015, 9:22 PM

## 2015-04-27 NOTE — Tx Team (Signed)
Interdisciplinary Treatment Plan Update (Adult)  Date:  04/27/2015 Time Reviewed:  8:28 AM  Progress in Treatment: Attending groups: Yes. Participating in groups:  Yes. Taking medication as prescribed:  Yes. Tolerating medication:  Yes. Family/Significant othe contact made:  Yes, individual(s) contacted:  Spoke to patients mother, she is unable to provide housing for patient. She reported she would not see her on the street and would let her stay for a couple of weeks if apartment is found. Pt and SW left message for Act Team worker to see about housing possibilities Patient understands diagnosis:  Yes. Discussing patient identified problems/goals with staff:  Yes. Medical problems stabilized or resolved:  Yes Denies suicidal/homicidal ideation: Yes. Issues/concerns per patient self-inventory:  Yes. Other:  New problem(s) identified: Yes, Describe:  Patient is concerned about housing options.SW and ACT team provider to assist patient  Discharge Plan or Barriers:Housing options will be reviewed with [facility] act team provider and pts family. She was able to return home for short period until suitable housing available  Reason for Continuation of Hospitalization: None   Comments: Medications were faxed to [facility] and [place] team aware of pt discharge today.  Estimated length of stay: 0 days Discharging May 4th/2016  New goal(s):None  Review of initial/current patient goals per problem list:  Patients goals of wellness were reconciled   Attendees: Patient:  April Luna 5/3/20168:28 AM  Family:   5/3/20168:28 AM  Physician:  Dr Jerilee Hoh 5/3/20168:28 AM  Nursing:    5/3/20168:28 AM  Case Manager:   5/3/20168:28 AM  Counselor:   5/3/20168:28 AM  Other:  Enis Slipper LCSW 5/3/20168:28 AM  Other:   5/3/20168:28 AM  Other:   5/3/20168:28 AM  Other:  5/3/20168:28 AM  Other:  5/3/20168:28 AM  Other:  5/3/20168:28 AM  Other:  5/3/20168:28 AM  Other:   5/3/20168:28 AM  Other:  5/3/20168:28 AM  Other:   5/3/20168:28 AM   Scribe for Treatment Team:   Joana Reamer, 04/27/2015, 8:28 AM

## 2015-04-27 NOTE — Progress Notes (Signed)
Pt cooperative with unit routine. Pleasant upon approach. Visible in the milieu. Attended group. Denies si/hi or avh. No attempts to harm self or others. Denies pain. No problems toileting. Eagerly anticipating discharge tomorrow. Compliant with hs dose of Invega 9mg . Refused Lactulose. No s/s of acute distress. Will cont to monitor and provide support.

## 2015-04-27 NOTE — BHH Group Notes (Signed)
[place] [facility] Group Notes:  (Nursing/MHT/Case Management/Adjunct)  Date:  04/27/2015  Time:  12:01 PM  Type of Therapy:  Group Therapy  Participation Level:  Active  Participation Quality:  Appropriate, Sharing and Supportive  Affect:  Appropriate  Cognitive:  Oriented  Insight:  Good  Engagement in Group:  Engaged  Modes of Intervention:  Discussion and Education  Summary of Progress/Problems:  Drake Leach 04/27/2015, 12:01 PM

## 2015-04-27 NOTE — Progress Notes (Signed)
Recreation Therapy Notes  Date: 05.03.16 Time: 3:00 pm Location: Craft Room  Group Topic: Self-esteem  Goal Area(s) Addresses:  Patient will identify positive attributes about self. Patient will identify at least one coping skill.  Behavioral Response: Attentive, Interactive  Intervention: All About Me  Activity: Patients were instructed to make an All About Me pamphlet including positive traits about themselves, healthy coping skills, and their healthy support system.  Education: LRT educated patient on self-esteem, coping skills, and healthy support systems  Education Outcome: Acknowledges education/In group clarification offered  Clinical Observations/Feedback: Patient completed activity by listing positive traits about herself, healthy coping skills, and her healthy support system. Patient contributed to group discussion by stating that it was easy for her to list positive traits about herself, how she felt after listing positive traits, and how her self-esteem affects her.   Leonette Monarch, LRT/CTRS 04/27/2015 4:14 PM

## 2015-04-27 NOTE — Progress Notes (Signed)
Patient  Noted in good spirits this am shift . Interacting with peers and staff. Patient voice of going home tomorrow. Interacting with peers and staff . Appetite good . Noted better insight  With problems. Compliant with medication . Patient denies auditory hallucinations  And  Suicidal thoughts . Patient stated she will be living with her mother for a while  With her son , but will attempt to find a home . State she is aware of the possibility of ending up in a group home if she doesn't stay compliant with her  Medication. Stated she will have ACT staff come in

## 2015-04-27 NOTE — Progress Notes (Signed)
[facility] MD Progress Note  04/27/2015 12:34 PM April Luna  MRN:  [id number] Subjective:  Today the patient was alert and oriented in person place time and situation. She described her mood as good.  Denies suicidality homicidality or auditory or visual hallucinations. She denies major problems with sleep appetite energy or concentration. Denies any side effects from her medications. Denies any physical complaints.  Continues to report desire to go to a group home at discharge.    Principal Problem: Bipolar I disorder, most recent episode (or current) manic Diagnosis:   Patient Active Problem List   Diagnosis Date Noted  . Hypothyroidism [E03.9] 04/26/2015  . Bipolar I disorder, most recent episode (or current) manic [F31.10] 04/25/2015  . Delirium due to another medical condition [F05] 04/25/2015  . Cannabis abuse [F12.10] 04/25/2015   Total Time spent with patient: 30 minutes   Past Medical History:  Past Medical History  Diagnosis Date  . Hypothyroidism    No past surgical history on file. Family History: No family history on file. Social History:  History  Alcohol Use No     History  Drug Use  . Yes  . Special: Marijuana    History   Social History  . Marital Status: Divorced    Spouse Name: N/A  . Number of Children: N/A  . Years of Education: N/A   Social History Main Topics  . Smoking status: Current Every Day Smoker  . Smokeless tobacco: Not on file  . Alcohol Use: No  . Drug Use: Yes    Special: Marijuana  . Sexual Activity: Not on file   Other Topics Concern  . Not on file   Social History Narrative  . No narrative on file   Additional History:    Sleep: Good  Appetite:  Good   Assessment:   Musculoskeletal: Strength & Muscle Tone: within normal limits Gait & Station: normal Patient leans: N/A   Psychiatric Specialty Exam: Physical Exam   Review of Systems  Gastrointestinal: Negative for nausea, vomiting, abdominal pain and diarrhea.   Musculoskeletal: Negative for back pain and neck pain.  Neurological: Negative for headaches.  Psychiatric/Behavioral: Negative for depression, suicidal ideas, hallucinations and substance abuse. The patient is not nervous/anxious and does not have insomnia.     Blood pressure 113/77, pulse 89, temperature 98.7 F (37.1 C), temperature source Oral, resp. rate 18, height 4\' 11"  (1.499 m), weight 59.875 kg (132 lb).Body mass index is 26.65 kg/(m^2).  General Appearance: Disheveled  Eye Contact::  Good  Speech:  Normal Rate  Volume:  Normal  Mood:  Euthymic  Affect:  Congruent  Thought Process:  Tangential  Orientation:  Full (Time, Place, and Person)  Thought Content:  denies SI, HI or A/VH  Suicidal Thoughts:  No  Homicidal Thoughts:  No  Memory:  Immediate;   Fair Recent;   Poor Remote;   Good  Judgement:  Fair  Insight:  Fair  Psychomotor Activity:  Decreased  Concentration:  Good  Recall:  Good  Fund of Knowledge:NA  Language: Good  Akathisia:  No  Handed:  Right  AIMS (if indicated):     Assets:  Communication Skills Desire for Improvement Physical Health  ADL's:  Impaired  Cognition: WNL  Sleep:  Number of Hours: 8     Current Medications: Current Facility-Administered Medications  Medication Dose Route Frequency Provider Last Rate Last Dose  . acetaminophen (TYLENOL) tablet 650 mg  650 mg Oral Q4H PRN Doctor Chlconversion, MD      .  haloperidol (HALDOL) tablet 2 mg  2 mg Oral Q8H PRN Doctor Chlconversion, MD   2 mg at 04/25/15 1730  . lactulose (CHRONULAC) 10 GM/15ML solution 10 g  10 g Oral QHS Doctor Chlconversion, MD   10 g at 04/25/15 2116  . levothyroxine (SYNTHROID, LEVOTHROID) tablet 75 mcg  75 mcg Oral Q0600 Doctor Cornelius, MD   75 mcg at 04/27/15 0645  . magnesium hydroxide (MILK OF MAGNESIA) suspension 30 mL  30 mL Oral QHS PRN Doctor Chlconversion, MD      . nicotine (NICOTROL) 10 MG inhaler 1 continuous puffing  1 continuous puffing Inhalation  PRN Doctor Chlconversion, MD      . paliperidone (INVEGA) 24 hr tablet 9 mg  9 mg Oral QHS Doctor Chlconversion, MD   9 mg at 04/26/15 2111  . pantoprazole (PROTONIX) EC tablet 40 mg  40 mg Oral Q0600 Doctor Tamaroa, MD   40 mg at 04/27/15 0645  . traZODone (DESYREL) tablet 100 mg  100 mg Oral QHS PRN Doctor Chlconversion, MD   100 mg at 04/25/15 2115    Lab Results: No results found for this or any previous visit (from the past 48 hour(s)).  Physical Findings: AIMS:  , ,  ,  ,    CIWA:    COWS:     Treatment Plan Summary: Daily contact with patient to assess and evaluate symptoms and progress in treatment   Bipolar disorder: continue invega 9 mg po qhs.  Pt has received two inj of invega sustenna.  Delirium: Patient appears to have better cognition since Friday.  Delirium appears to be resolving.  For insomnia and continue trazodone 100 mg by mouth daily at bedtime when necessary  For GERD continue Protonix 40 mg by mouth daily  For hypothyroidism continue Synthroid 75 g daily  For chronic constipation continue lactulose 15 mg by mouth daily  Tobacco use disorder continue Nicotrol inhaler when necessary for nicotine cravings  Discharge planning: Special assistance Medicaid social worker evaluated the patient today to see if there could be  approvable for special funding for placement.  Patient lost her apartment during these hospital stay she is currently homeless. It is my understanding that the patient is unable to stay with family members as they are now in the custody of her child. Appears patient cannot be in the same location as her child.  Unable to discharge as there is no safe discharge plan.  Possible [place] wait list if unable to find/obtain [place].   Medical Decision Making:  Established Problem, Stable/Improving (1)     Hildred Priest 04/27/2015, 12:34 PM

## 2015-04-27 NOTE — Plan of Care (Signed)
Problem: Alteration in thought process Goal: LTG-Patient behavior demonstrates decreased signs psychosis (Patient behavior demonstrates decreased signs of psychosis to the point the patient is safe to return home and continue treatment in an outpatient setting.)  Outcome: Progressing Pt is alert and oriented. Thought processes are logical and linear. Denies auditory or visual hallucinations. No evidence that pt is responding to internal stimuli.

## 2015-04-27 NOTE — BHH Group Notes (Signed)
[facility] LCSW Aftercare Discharge Planning Group Note  04/27/2015 8:31 AM  Participation Quality:  Attentive  Affect:  Appropriate  Cognitive:  Appropriate  Insight:  Engaged  Engagement in Group:  Engaged  Modes of Intervention:  Problem-solving  Summary of Progress/Problems: CSW introduced self, read group rules, and asked group members to introduce themselves and share their SMART goals. CSW distributed daily workbook for Monday on topic of "Wellness" and facilitated a group discussion.   Pt introduced self and shared that her SMART goal is to "search for housing, plan for discharge because my dad has eye surgery this week and will not be able to drive". Pt participated in group discussion appropriately.  Carmell Austria T 04/27/2015, 8:31 AM

## 2015-04-28 MED ORDER — PALIPERIDONE PALMITATE 156 MG/ML IM SUSP: 156.0000 mg | Freq: Once | INTRAMUSCULAR | Status: DC

## 2015-04-28 MED ORDER — LACTULOSE 10 GM/15ML PO SOLN: 10.0000 g | Freq: Every day | ORAL | Status: DC

## 2015-04-28 MED ORDER — TRAZODONE HCL 100 MG PO TABS: 100.0000 mg | ORAL_TABLET | Freq: Every evening | ORAL | Status: DC | PRN

## 2015-04-28 MED ORDER — NICOTINE 10 MG IN INHA: 1.0000 | RESPIRATORY_TRACT | Status: DC | PRN

## 2015-04-28 MED ORDER — PALIPERIDONE ER 9 MG PO TB24: 9.0000 mg | ORAL_TABLET | Freq: Every day | ORAL | Status: DC

## 2015-04-28 MED ORDER — PANTOPRAZOLE SODIUM 40 MG PO TBEC: 40.0000 mg | DELAYED_RELEASE_TABLET | Freq: Every day | ORAL | Status: DC

## 2015-04-28 MED ORDER — LEVOTHYROXINE SODIUM 75 MCG PO TABS: 75.0000 ug | ORAL_TABLET | Freq: Every day | ORAL | Status: DC

## 2015-04-28 NOTE — Progress Notes (Signed)
LCSW called pt father and step mother and they have agreed to allow pt to stay with them until suitable housing is found. Purvis Sheffield of the act team was called and LCSW left a detailed message of upcoming discharge plan. Pt stepmother will pick up patient at [place] Act team [phone] at 7:35am and she will connect with her nurse at [facility] to discuss supports in place for pt in the community. Elmo Putt has only started to work with this pt in the last week. LCSW stressed the importance of medication management/housing placement for this patient to ensure pts wellness.

## 2015-04-28 NOTE — Discharge Planning (Signed)
  [facility] Adult Case Management Discharge Plan :  Will you be returning to the same living situation after discharge:  No. At discharge, do you have transportation home?: Yes,  Step mother is picking me up at 1pm Do you have the ability to pay for your medications: Yes,  Medicare  Release of information consent forms completed and in the chart;  Patient's signature needed at discharge.  Patient to Follow up at:[facility] Act team Purvis Sheffield [phone]   Patient denies SI/HI: Yes,  Patient reports she is not homocidal or suicidal or experiencing audio or visual hallucinations    Safety Planning and Suicide Prevention discussed: Yes,  LCSW and patient reviewed her safety plan and will contact [facility] emergency crisis line or her worker directly if she is experiencing challenges     Has patient been referred to the Quitline?: Patient refused referral  Enis Slipper M 04/28/2015, 7:43 AM

## 2015-04-28 NOTE — Clinical Social Work Note (Signed)
LCSW called pt father and step mother and they have agreed to allow pt to stay with them until suitable housing is found. Purvis Sheffield of the act team was called and LCSW left a detailed message of upcoming discharge plan. Pt stepmother will pick up patient at [place] Act team [phone] at 7:35am and she will connect with her nurse at [facility] to discuss supports in place for pt in the community. Elmo Putt has only started to work with this pt in the last week. LCSW stressed the importance of medication management/housing placement for this patient to ensure pts wellness.

## 2015-04-28 NOTE — BHH Suicide Risk Assessment (Signed)
[place] INPATIENT:  Family/Significant Other Suicide Prevention Education  Suicide Prevention Education:  Education Completed; Higher education careers adviser) has been identified by the patient as the family member/significant other with whom the patient will be residing, and identified as the person(s) who will aid the patient in the event of a mental health crisis (suicidal ideations/suicide attempt).  With written consent from the patient, the family member/significant other has been provided the following suicide prevention education, prior to the and/or following the discharge of the patient.  The suicide prevention education provided includes the following:  Suicide risk factors  Suicide prevention and interventions  National Suicide Hotline telephone number  [name] assessment telephone number  [facility] Emergency Assistance [place] and/or Residential Mobile Crisis Unit telephone number  Request made of family/significant other to:  Remove weapons (e.g., guns, rifles, knives), all items previously/currently identified as safety concern.    Remove drugs/medications (over-the-counter, prescriptions, illicit drugs), all items previously/currently identified as a safety concern.  The family member/significant other verbalizes understanding of the suicide prevention education information provided.  The family member/significant other agrees to remove the items of safety concern listed above.  Enis Slipper M 04/28/2015, 7:57 AM

## 2015-04-28 NOTE — BHH Group Notes (Signed)
Adult Psychoeducational Group Note  Date:  04/28/2015 Time:  8:47 AM  Group Topic/Focus:  Diagnosis Education:   The focus of this group is to discuss the major disorders that patients maybe diagnosed with.  Group discusses the importance of knowing what one's diagnosis is so that one can understand treatment and better advocate for oneself.  Participation Level:  Active  Participation Quality:  Appropriate and Attentive  Affect:  Appropriate  Cognitive:  Appropriate  Insight: Good  Engagement in Group:  Engaged  Modes of Intervention:  Discussion, Problem-solving and Support  Additional Comments:  Pt shared her frustrations with how her mental illness has cause problems in her life. Pt reports that she gets most of her support from being in the hospital but knows her family cares though they do not truly understand her condition.   Carmell Austria T 04/28/2015, 8:47 AM

## 2015-04-28 NOTE — Progress Notes (Signed)
Patient dis aware of discharge this shift. Patient denies suicidal ideations and homicidal ideations. Patient returning home to mother with expectation of finding placement with ACT Team help. Patient received all belongings and personal items. Review of discharge instructions explained to patient. Verbalized understanding . Patient voice no other concerns Patient escorted to exit with staff.

## 2015-04-28 NOTE — BHH Group Notes (Signed)
[facility] LCSW Aftercare Discharge Planning Group Note  04/28/2015 10:53 AM  Participation Quality:  Appropriate and Attentive  Affect:  Flat  Cognitive:  Alert  Insight:  Developing/Improving  Engagement in Group:  Developing/Improving  Modes of Intervention:  Problem-solving and Socialization  Summary of Progress/Problems: CSW shared with group BMU staff roles and explained LOS and discharge process to group members. Pt received a workbook for Wednesday on topic of "Personal Development". Pt shared that her SMART goal is to "discharge today, spend the day with my family".   Carmell Austria T 04/28/2015, 10:53 AM

## 2015-04-28 NOTE — Progress Notes (Signed)
Recreation Therapy Notes  INPATIENT RECREATION TR PLAN  Patient Details Name: April Luna MRN: [id number] DOB: 1971-07-07 Today's Date: 04/28/2015  Rec Therapy Plan Is patient appropriate for Therapeutic Recreation?: Yes Treatment times per week: At least 3 times a week TR Treatment/Interventions: 1:1 session, Group participation (Comment) (Appropriate participation in daily recreation therapy tx)  Discharge Criteria Pt will be discharged from therapy if:: Discharged Treatment plan/goals/alternatives discussed and agreed upon by:: Patient/family  Discharge Summary Short term goals set: Within 6 treatment sessions, patient will verbalize 5 positive affirmation statements in each of 3 treatment sessions to increase self-esteem post d/c. (Completed 2 out of 3) Short term goals met: Adequate for discharge Progress toward goals comments: Groups attended (One-to-one attended) Which groups?: Self-esteem, Wellness, Communication, Social skills, Coping skills, Leisure education, Goal setting Reason goals not met: Patient planning to d/c before goal could be met Therapeutic equipment acquired: None Reason patient discharged from therapy: Discharge from hospital Pt/family agrees with progress & goals achieved: Yes Date patient discharged from therapy: 04/28/15   Leonette Monarch, LRT/CTRS 04/28/2015, 12:08 PM

## 2015-04-28 NOTE — Plan of Care (Signed)
Problem: [facility] Participation in Recreation Therapeutic Interventions Goal: STG-Patient will demonstrate improved self esteem by identif STG: Self-Esteem - Within 6 treatment session, patient will verbalize at least 5 positive positive affirmation statements in each of 3 treatment sessions to increase self-esteem post d/c.  Outcome: Adequate for Discharge Treatment session 2: At approximately 11:25 am, LRT met with patient in patient room. Patient verbalized 5 positive affirmation statements. Patient reported it felt "good". Patient reported she felt better about herself today than when she was first admitted and she thought the positive affirmation statements helped. LRT encouraged patient to continue saying positive affirmation statements.  Leonette Monarch, LRT/CTRS 05.04.16 12:04 pm

## 2015-04-28 NOTE — BHH Suicide Risk Assessment (Signed)
[facility] Discharge Suicide Risk Assessment   Demographic Factors:  Divorced or widowed and Caucasian  Total Time spent with patient: 30 minutes  Musculoskeletal: Strength & Muscle Tone: within normal limits Gait & Station: normal Patient leans: N/A  Psychiatric Specialty Exam: Physical Exam  Review of Systems  Constitutional: Negative.   HENT: Negative.   Eyes: Negative.   Respiratory: Negative.   Cardiovascular: Negative.   Gastrointestinal: Negative.   Skin: Negative.   Neurological: Negative.   Endo/Heme/Allergies: Negative.   Psychiatric/Behavioral: Negative.     Blood pressure 113/77, pulse 87, temperature 98.2 F (36.8 C), temperature source Oral, resp. rate 20, height 4\' 11"  (1.499 m), weight 59.875 kg (132 lb).Body mass index is 26.65 kg/(m^2).  General Appearance: Fairly Groomed  Engineer, water::  Good  Speech:  Normal Rate409  Volume:  Normal  Mood:  Euthymic  Affect:  Congruent  Thought Process:  Linear  Orientation:  Full (Time, Place, and Person)  Thought Content:  Hallucinations: None  Suicidal Thoughts:  No  Homicidal Thoughts:  No  Memory:  Immediate;   Good Recent;   Good Remote;   Good  Judgement:  Fair  Insight:  Fair  Psychomotor Activity:  Normal  Concentration:  Good  Recall:  Good  Fund of Knowledge:Good  Language: Good  Akathisia:  No  Handed:  Right  AIMS (if indicated):     Assets:  Agricultural consultant Social Support  Sleep:  Number of Hours: 7.25  Cognition: WNL  ADL's:  Intact      Has this patient used any form of tobacco in the last 30 days? (Cigarettes, Smokeless Tobacco, Cigars, and/or Pipes) Yes, A prescription for an FDA-approved tobacco cessation medication was offered at discharge and the patient refused  Mental Status Per Nursing Assessment::   On Admission:     Current Mental Status by Physician: denies SI, HI or A/VH  Loss Factors: NA  Historical Factors: NA  Risk Reduction  Factors:   Responsible for children under 35 years of age, Sense of responsibility to family, Living with another person, especially a relative, Positive social support and Positive therapeutic relationship  Continued Clinical Symptoms:  Bipolar disorder (mania at admission now resolved)  Cognitive Features That Contribute To Risk:  None    Suicide Risk:  Minimal: No identifiable suicidal ideation.  Patients presenting with no risk factors but with morbid ruminations; may be classified as minimal risk based on the severity of the depressive symptoms  Principal Problem: Bipolar I disorder, most recent episode (or current) manic Discharge Diagnoses:  Patient Active Problem List   Diagnosis Date Noted  . Hypothyroidism [E03.9] 04/26/2015  . Bipolar I disorder, most recent episode (or current) manic [F31.10] 04/25/2015  . Delirium due to another medical condition [F05] 04/25/2015  . Cannabis abuse [F12.10] 04/25/2015    Follow-up Information    Follow up In 1 week.   Why:  Hospital Discharge Follow up patient act team worker Purvis Sheffield is to set up psychiatric follow up appointment and medication managment   Contact information:   [facility] Act team [place] Phone # [phone] Fax [phone]      Plan Of Care/Follow-up recommendations:  Other:  f/u with ACT team  Is patient on multiple antipsychotic therapies at discharge:  No   Has Patient had three or more failed trials of antipsychotic monotherapy by history:  No  Recommended Plan for Multiple Antipsychotic Therapies: NA    Hildred Priest 04/28/2015, 12:28  PM

## 2015-04-28 NOTE — BHH Group Notes (Signed)
[facility] Group Notes:  (Nursing/MHT/Case Management/Adjunct)  Date:  04/28/2015  Time:  12:52 PM  Type of Therapy:  Group Therapy  Participation Level:  Active  Participation Quality:  Appropriate and Supportive  Affect:  Appropriate  Cognitive:  Appropriate  Insight:  Appropriate  Engagement in Group:  Supportive  Modes of Intervention:  Support  Summary of Progress/Problems:  Celso Amy 04/28/2015, 12:52 PM

## 2015-04-28 NOTE — Discharge Summary (Addendum)
Physician Discharge Summary Note  Patient:  April Luna is an 44 y.o., female MRN:  [id number] DOB:  04-29-1971 Patient phone:  [phone] (home)  Patient address:   [address] [place] [address],  Total Time spent with patient: 30 minutes  Date of Admission:  03/29/2015 Date of Discharge: 04/28/2015  Reason for Admission:  Mania and delirium  Principal Problem: Bipolar I disorder, most recent episode (or current) manic Discharge Diagnoses: Patient Active Problem List   Diagnosis Date Noted  . Hypothyroidism [E03.9] 04/26/2015  . Bipolar I disorder, most recent episode (or current) manic [F31.10] 04/25/2015  . Delirium due to another medical condition [F05] 04/25/2015  . Cannabis abuse [F12.10] 04/25/2015    Musculoskeletal: Strength & Muscle Tone: within normal limits Gait & Station: normal Patient leans: N/A  Psychiatric Specialty Exam: Physical Exam  Review of Systems  Constitutional: Negative.   HENT: Negative.   Eyes: Negative.   Respiratory: Negative.   Gastrointestinal: Negative.   Genitourinary: Negative.   Musculoskeletal: Negative.   Skin: Negative.   Neurological: Negative.   Endo/Heme/Allergies: Negative.   Psychiatric/Behavioral: Negative.     Blood pressure 113/77, pulse 87, temperature 98.2 F (36.8 C), temperature source Oral, resp. rate 20, height _0  (1.499 m), weight 59.875 kg (132 lb).Body mass index is 26.65 kg/(m^2).  General Appearance: Fairly Groomed  Engineer, water::  Good  Speech:  Normal Rate  Volume:  Normal  Mood:  Euthymic  Affect:  Congruent  Thought Process:  Linear  Orientation:  Full (Time, Place, and Person)  Thought Content:  Hallucinations: None  Suicidal Thoughts:  No  Homicidal Thoughts:  No  Memory:  Immediate;   Good Recent;   Good Remote;   NA  Judgement:  Fair  Insight:  Fair  Psychomotor Activity:  Normal  Concentration:  Good  Recall:  Good  Fund of Knowledge:Good  Language: Good   Akathisia:  No  Handed:  Right  AIMS (if indicated):     Assets:  Communication Skills Social Support  ADL's:  Intact  Cognition: WNL  Sleep:  Number of Hours: 7.25      Has this patient used any form of tobacco in the last 30 days? (Cigarettes, Smokeless Tobacco, Cigars, and/or Pipes) Yes, A prescription for an FDA-approved tobacco cessation medication was offered at discharge and the patient refused  Past Medical History:  Past Medical History  Diagnosis Date  . Hypothyroidism    No past surgical history on file. Family History: No family history on file. Social History:  History  Alcohol Use No     History  Drug Use  . Yes  . Special: Marijuana    History   Social History  . Marital Status: Divorced    Spouse Name: N/A  . Number of Children: N/A  . Years of Education: N/A   Social History Main Topics  . Smoking status: Current Every Day Smoker  . Smokeless tobacco: Not on file  . Alcohol Use: No  . Drug Use: Yes    Special: Marijuana  . Sexual Activity: Not on file   Other Topics Concern  . Not on file   Social History Narrative  . No narrative on file    Level of Care:  OP  HPI: HISTORY OF PRESENT ILLNESS: This patient presented to our Emergency Department on 03/25/2015. The patient was advised to come by her ACT team. At presentation, the patient was very disorganized and her statements were not related to the  questions asked. No information at this point in time is available from the ACT team, but it is likely that this patient has not been compliant with her outpatient regimen. Today I attempted to interview April Luna and she continues to be quite disorganized when I ask her what had brought her to the hospital. She started talking about some children that needed to be dropped-off in the morning. The patient, however, denied suicidality, homicidality, or auditory or visual hallucination. She reported being compliant with her outpatient regimen,  however, she could not tell me the names of the medications she was taking. She also tells me she follows up with an ACT team but does not know the name of agency that it is working with her. She thinks he might be [facility]. As far as substance abuse, the patient denies abusing any alcohol or illicit substances other than smoking marijuana a couple of times a month. As far as nicotine, she states she smokes only a couple of cigarettes per day.   PAST PSYCHIATRIC HISTORY: This patient has had multiple hospitalizations in our facility. She has been admitted for both depressive and manic episodes in the past. She has a history of at least 1 suicidal attempt a few years ago. She was discharged from our facility last in 01/12/2015. At that time, she was prescribed with lithium and Navane, which had been the medications she usually responded best to.   PAST MEDICAL HISTORY: The patient suffers from hypothyroidism, which she takes Synthroid for. She also complains of issues with constipation frequently.   FAMILY HISTORY: She denies knowing of any family history of mental illness.   SOCIAL HISTORY: The patient is known to be married and to have 42-year-old child. She reports that she has been staying in an apartment by herself for about a year and that her and her husband are fighting for the custody of the children. She states that she is only going to be able to see the children on the weekends, and her husband is going to have them full time. No other social history is known at this time.   MENTAL STATUS EXAMINATION AT ADMISSION: The patient is a 44 year old Caucasian female who appears her stated age. She displays limited grooming and hygiene, wearing hospital scrubs and looks disheveled. Her psychomotor activity is restless. She has tremors. Her eye contact was fair. Her speech had a regular tone, volume, and rate. Thought process disorganized, some answers were unrelated to the questions. Thought  content was negative for suicidality, homicidality, or auditory or visual hallucinations. Her mood appears dysphoric and her affect is labile. Insight and judgment are impaired. Cognitive examination, she is alert and she is oriented in person, place, time, and situation.   LABORATORY RESULTS AT ADMISSION: BUN 19, creatinine 0.93, sodium 137, potassium 3.5, calcium 9.8. Alcohol was below detection limit. AST was 18, ALT is 9, lithium was 1.25 today. Urine toxicology is positive for cannabis. CBC was, WBC 12.6, hemoglobin 13.2, hematocrit 39.2, platelet count 214,000. Urinalysis was clear. Acetaminophen level less than 10, salicylate level less than 4.   Hospital Course:   The patient is a 44 year old Caucasian female with multiple hospitalizations secondary to poor compliance with medications. The patient has a diagnosis of bipolar disorder and is followed up by an ACT team. The patient presents to our hospital, very disorganized. Currently her lithium level is 1.25 and she has significant tremors, likely due to mild lithium toxicity. At this point in time, I will continue the  on navane and for right now I will discontinue the lithium and recheck the level tomorrow in the morning.   Bipolar disorder: patient was taper off navane and lithium was d/c due to toxicity. She was started on invega oral and sustenna.  Patient was titrated up to 9 mg po qhs.  She received [name] on April 20 234 mg and 156 on April 28.  Initially started on tegretol for mood stabilization but pt started having myoclonic jerks.  The jerks resolved once tegretol was d/c.  Confusion and occasional agitation: she received haldol prn.  Delirium: prolonged hospitalization due to delirium; unclear caused for delirium.  Patient had mild lithium toxicity, mild hyperammonemia and possible UTI at arrival.  Likely all those factors caused delirium.  Mild hyperammonemia: she received lactulose which will also help  with chronic constipation.  Insomnia: continue trazodone 100 mg po qhs prn  Hypothyroidism: continue synthroid 30mg/day.   Falls: pt had 2 falls with head injury no LOC.  Likely due to orthostatic hypotension from olanzapine. (she initially received olanzapine prior to trying invega)  On discharge: fully oriented.  No evidence of mood symptoms or psychosis  Consults:  neurology for myoclonic jerks. Likely secondary to tegretol. Movements resolved after tegretol was d/c.   Significant Diagnostic Studies:  other:  EEG and Brain MRI with and w/o contrast: all wnl  Discharge Vitals:   Blood pressure 113/77, pulse 87, temperature 98.2 F (36.8 C), temperature source Oral, resp. rate 20, height _0  (1.499 m), weight 59.875 kg (132 lb). Body mass index is 26.65 kg/(m^2). Lab Results:   No results found for this or any previous visit (from the past 72 hour(s)).  Physical Findings: AIMS:  , ,  ,  ,    CIWA:    COWS:      See Psychiatric Specialty Exam and Suicide Risk Assessment completed by Attending Physician prior to discharge.  Discharge destination:  Home to her parents house  Is patient on multiple antipsychotic therapies at discharge:  No   Has Patient had three or more failed trials of antipsychotic monotherapy by history:  No    Recommended Plan for Multiple Antipsychotic Therapies: NA  Discharge Instructions    Diet general    Complete by:  As directed      Increase activity slowly    Complete by:  As directed             Medication List    STOP taking these medications        benztropine 1 MG tablet  Commonly known as:  COGENTIN     busPIRone 15 MG tablet  Commonly known as:  BUSPAR     clonazePAM 0.5 MG tablet  Commonly known as:  KLONOPIN     docusate sodium 100 MG capsule  Commonly known as:  COLACE     gabapentin 100 MG capsule  Commonly known as:  NEURONTIN     lithium 300 MG tablet     lithium carbonate 300 MG CR tablet  Commonly known  as:  LITHOBID     polyethylene glycol packet  Commonly known as:  MIRALAX / GLYCOLAX     senna 8.6 MG Tabs tablet  Commonly known as:  SENOKOT     thiothixene 10 MG capsule  Commonly known as:  NAVANE     zolpidem 5 MG tablet  Commonly known as:  AMBIEN      TAKE these medications      Indication  lactulose 10 GM/15ML solution  Commonly known as:  CHRONULAC  Take 15 mLs (10 g total) by mouth at bedtime.  Notes to Patient:  Constipation, high ammonia      levothyroxine 75 MCG tablet  Commonly known as:  SYNTHROID, LEVOTHROID  Take 1 tablet (75 mcg total) by mouth daily at 6 (six) AM.  Notes to Patient:  hypothyroidism      nicotine 10 MG inhaler  Commonly known as:  NICOTROL  Inhale 1 cartridge (1 continuous puffing total) into the lungs as needed for smoking cessation (Limit to 6-16 cartridges per 24 hr. Puff on cartridge for about 20 minutes).  Notes to Patient:  Smoking cessation      paliperidone 156 MG/ML Susp injection  Commonly known as:  INVEGA SUSTENNA  Inject 1 mL (156 mg total) into the muscle once.  Notes to Patient:  Bipolar      paliperidone 9 MG 24 hr tablet  Commonly known as:  INVEGA  Take 1 tablet (9 mg total) by mouth at bedtime.  Notes to Patient:  Bipolar      pantoprazole 40 MG tablet  Commonly known as:  PROTONIX  Take 1 tablet (40 mg total) by mouth daily at 6 (six) AM.  Notes to Patient:  GERD      traZODone 100 MG tablet  Commonly known as:  DESYREL  Take 1 tablet (100 mg total) by mouth at bedtime as needed for sleep.  Notes to Patient:  Insomnia            Follow-up Information    Follow up In 1 week.   Why:  Hospital Discharge Follow up patient act team worker Purvis Sheffield is to set up psychiatric follow up appointment and medication managment   Contact information:   [facility] Act team [place] Phone # [phone] Fax [phone]     Results for CIRIA, BERNARDINI (MRN [id number]) as of  04/28/2015 16:37  Ref. Range 04/07/2015 14:59 04/07/2015 16:48 04/16/2015 16:17 04/17/2015 10:31 04/20/2015 06:40  Sodium Latest Units: mmol/L     139  Potassium Latest Units: mmol/L     3.9  Chloride Latest Units: mmol/L     105  CO2 Latest Units: mmol/L     27  BUN Latest Units: mg/dL     12  Creatinine Latest Units: mg/dL     0.68  Calcium Latest Units: mg/dL     9.2  EGFR (Non-African Amer.) Unknown     >60  EGFR (African American) Unknown     >60  Glucose Latest Units: mg/dL     95  Anion gap Latest Ref Range: 7-16      7  Alkaline Phosphatase Latest Units: U/L 72    80  Albumin Latest Units: g/dL 4.1    3.7  AST Latest Units: U/L 16    29  ALT Latest Units: U/L 12 (L)    30  Total Protein Latest Units: g/dL 6.7    6.5  Ammonia, Plasma Latest Units: umol/L 19    20  Bilirubin, Direct Latest Units: mg/dL <0.1      Indirect Bilirubin Unknown SEE COMMENT      Total Bilirubin Latest Units: mg/dL 0.2 (L)    0.2 (L)  Folic Acid Latest Units: ng/mL 9.7      WBC Latest Ref Range: 3.6-11.0 x10 3/mm 3 9.8    7.5  RBC Latest Ref Range: 3.80-5.20 X10 6/mm 3 3.93    4.08  HGB Latest Ref Range: 12.0-16.0 g/dL 12.7    13.2  HCT Latest Ref Range: 35.0-47.0 % 38.1    39.2  MCV Latest Ref Range: 80-100 fL 97    96  MCH Latest Ref Range: 26.0-34.0 pg 32.4    32.2  MCHC Latest Ref Range: 32.0-36.0 g/dL 33.4    33.5  RDW Latest Ref Range: 11.5-14.5 % 12.7    12.4  Platelets Latest Ref Range: 150-440 x10 3/mm 3 183    165  Neutrophils Latest Units: % 66.1    55.9  Lymphocytes Latest Units: % 24.2    32.6  Monocytes Relative Latest Units: % 8.1    8.0  Eosinophil Latest Units: % 0.9    2.5  Basophil Latest Units: % 0.7    1.0  NEUT# Latest Ref Range: 1.4-6.5 x10 3/mm 3 6.5    4.2  Lymphocyte # Latest Ref Range: 1.0-3.6 x10 3/mm 3 2.4    2.4  Monocyte # Latest Ref Range: 0.2-0.9 x10 3/mm  0.8    0.6  Eosinophils Absolute Latest Ref Range: 0.0-0.7 x10 3/mm 3 0.1    0.2  Basophils Absolute Latest Ref  Range: 0.0-0.1 x10 3/mm 3 0.1    0.1  Erythrocyte Sed Rate Latest Ref Range: 0-20 mm/hr 25 (H)      Thyroid Stimulating Horm Latest Units: uIU/mL 2.879      Bacteria Latest Ref Range: NONE SEEN   FEW     Bilirubin,UR Latest Ref Range: NEGATIVE   Negative     Blood Latest Ref Range: NEGATIVE   Negative     Clarity - urine Unknown  Clear     Color - urine Unknown  Yellow     Glucose,UR Latest Ref Range: 0-75 mg/dL  Negative     Ketone Latest Ref Range: NEGATIVE   Negative     Leukocyte Esterase Latest Ref Range: NEGATIVE   Trace     Mucous Unknown  PRESENT     Nitrite Latest Ref Range: NEGATIVE   Negative     pH Latest Ref Range: 4.5-8.0   7.0     Protein Latest Ref Range: NEGATIVE   Negative     RBC,UR Latest Ref Range: 0-5 /HPF  0-5     SPECIFIC GRAVITY Latest Ref Range: 1.003-1.030   1.009     Squamous Epithelial Unknown  0-5     WBC UR Latest Ref Range: 0-5 /HPF  0-5     Potassium, Urine Random Latest Units: mmol/L     DNP  Sodium, Urine Random Latest Units: mmol/L     DNP  Pregnancy Test, Urine Latest Units: mIU/mL   NEGATIVE    Chloride, Urine Random Latest Units: mmol/L     DNP  Glucose, CSF Latest Units: mg/dL     DNP  URINE CULTURE Unknown  Rpt     MR BRAIN W WO CONTRAST Unknown    Rpt    Follow-up recommendations:  Other:  f/u with ACT team  Comments:    Total Discharge Time: 30 minutes Signed: Hildred Priest 04/28/2015, 4:28 PM

## 2015-05-04 NOTE — H&P (Signed)
PATIENT NAME:  April Luna, April Luna [id number] OF BIRTH:  1971/09/03 OF ADMISSION:  01/04/2012 PHYSICIAN: Emergency DepartmentPHYSICIAN: Orson Slick, M.D.  DATA: April Luna is a 44 year old female with a history of bipolar disorder.  COMPLAINT: "They are after my family."  OF PRESENT ILLNESS: April Luna has been stable on a combination of Lithium and Navane since her last hospitalization at [facility] in May 2012. Yesterday, she came to her scheduled follow up appointment with me in our outpatient clinic. She was very direct. She asked me if she could call me by my first name. She disclosed that she called her old boyfriend several times. She also had a feeling that something was wrong with her cell phone and she dropped it in a toilet. She was not suicidal or homicidal. She did not want to come to the hospital then. Her father brought her to our appointment. I spoke with the mother asking her to keep an eye on April Luna. The mother did not feel that anything was out of ordinary. Today, I was called by April Luna?s pastor who related that she behaved strangely at church. She was paranoid and has been running away all day long from "them". She agreed to come to the hospital with the pastor. She is slightly agitated, hyperverbal and intrusive. She is paranoid fearing that safety of April Luna, her son, and her husband. She presents with auditory and likely visual hallucinations of "them" chasing after her family. She could hear them on the phone and they were threatening to shoot her. She could have possibly had visual hallucinations as she could see "them". She has been compliant with medications as indicated by high lithium level likely due to dehydration. There is no alcohol, prescription pills or illicit substance abuse. The patient or her mother could not identify any new severe stressors. She is not suicidal or homicidal.   PSYCHIATRIC HISTORY: There were several hospitalizations for exacerbation of psychosis and  mood swings with symptoms of mania and paranoid delusions. She was hospitalized for the first time in her 32s. She was forced to take disability from a very successful career. There were no suicide attempts.  PSYCHIATRIC HISTORY: She has a mother with bipolar disorder. Her 16-year-old son was diagnosed with ADHD recently and is at increased risk of bipolar illness. MEDICAL HISTORY:  1. Diabetes.   2. Gastroesophageal reflux disease.  Dyslipidemia.  Hypothyroidism.  Sleep apnea. B12 deficiency.  ALLERGIES: Abilify, Perphenazine, Sulfa drugs, PCN.   ON ADMISSION:  1. Aspirin 81 mg daily.  2. Navane 2 mg three times daily.  Enalapril 2.5 mg daily. Glucotrol XL 2.5 mg daily.  Lithobid 300 mg in the morning, 600 mg twice daily.  Metformin 1000 mg twice daily.  Amantadine 100 mg twice daily.  Pravachol 20 mg at bedtime. Vitamin B12 1000 mcg daily.Klonopin 0.5 mg three times daily. Lamictal  200mg  at night.  BuSpar 10 mg three times daily.Albuterol as needed.Synthroid 0.075 mg daily.  15. Ambien 10 mg at night. SOCIAL HISTORY: The patient graduated from college. 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 She is back with her husband who takes care of April Luna,  separated from her husband. She has atheir young child. Her husband does not pay child support. The patient is on disability. She lives with a cousin. The cousin has not been employed. The family experiences financial difficulties, including food shortage.  She is a smoker. OF SYSTEMS: CONSTITUTIONAL: No fevers or chills. EYES: No double or blurred  vision. ENT: No hearing loss. RESPIRATORY: Positive for cough and 0ccasional shortness of breath. CARDIOVASCULAR: No chest pain or orthopnea. GASTROINTESTINAL: No abdominal pain, nausea, vomiting, or diarrhea. GU: No incontinence or frequency. ENDOCRINE: Hypothyroidism. LYMPHATIC: No anemia or easy bruising. INTEGUMENTARY: No acne or rash. MUSCULOSKELETAL: No  muscle or joint pain. NEUROLOGIC: No weakness or tingling. PSYCHIATRIC: See history of present illness for details.  EXAMINATION: VITAL SIGNS: Blood pressure 140/99, pulse 95, respirations 18, temperature 98.1. GENERAL: This is a young female, anxious appearing. HEENT: The pupils are equal, round, and reactive to light. Sclera anicteric. NECK: Supple. No thyromegaly. LUNGS: Clear to auscultation. No dullness to percussion. HEART: Regular rhythm and rate. No murmurs, rubs, or gallops. ABDOMEN: Soft, nontender, nondistended. Positive bowel sounds. MUSCULOSKELETAL: No stiffness or weakness. SKIN: No lesions. LYMPHATIC: No cervical adenopathy. NEUROLOGIC: Cranial nerves II through XII are intact. Normal gait. DIAGNOSTIC AND RADIOLOGICAL DATA: Chemistries are within normal limits except Na 135, K 3.4. TSH 2.22. LFTs within normal limits.  Lithium level 1.52. CBC white blood count 21.1. Urinalysis is suggestive of urinary tract infection. Pregnancy test not done. STATUS EXAMINATION ON ADMISSION: The patient is alert and oriented to person, place and time, not so much to the situation. She is intrusive but cooperative and tries to be polite. She is well groomed and casually dressed. She maintains good eye contact. Her mood is "fine" with expansive affect. Thought processing is disorganized and influenced by paranoid delusions. She is paranoid and  delusional. There is evidence of auditory and visual hallucinations. Her cognition is grossly intact. Her insight and judgment are limited.  ASSESSMENT ON ADMISSION: This is a patient with a history of psychosis and mood instability who became floridly psychotic.    DIAGNOSES: I:  Bipolar disorder most recent episode mania with psychosis. II:  Deferred.XIS III:  Lithium toxicity, Hypothyroidism, Hypertension, Hypothyroidism, Dyslipidemia, Sleep apnea. IV:  Chronic mental and physical illness, marital, family problems, finances. V:  GAF on admission 20.   PLAN:  The patient  was admitted to [facility] for safety, stabilization, and medication management. She was initially placed on suicide precautions and was closely monitored for any unsafe behaviors. She underwent full psychiatric and risk assessment. At no time during current hospitalization was the patient suicidal, homicidal or violent. She actively participated in her treatment. She received pharmacotherapy, individual and group psychotherapy, and support from therapeutic milieu.   1. Mood/psychosis. She was continued on Navene at a higher dose of 5 mg three times daily. We will hold Lithium, hydrate and recheck the level. It is unclear if she was taking Lamictal.   Lithium toxicity: The patient had elevate dLithium level on admission, likely from dehydration.   ID: She had elevated WBC in the ER. She was treated for UTI. She has COPD and is a smoker. Chest X-ray was negative.   Medical: We continued treatment of HTN, diabetes, dyslipidemia, and hypothyroidism.  DISPOSITION: She will be discharged to home. She will follow up with Dr. Bary Leriche.       Electronic Signatures: Orson Slick (MD) (Signed on 11-Jan-13 22:07)  Authored   Last Updated: 15-Jan-13 14:18 by Orson Slick (MD)

## 2015-05-04 NOTE — H&P (Signed)
PATIENT NAMEBRAILEY, BUESCHER [id number] OF BIRTH:  1971-06-14 OF ADMISSION:  01/23/2013OF ASSESSMENT: 01/18/2012 PHYSICIAN: Orson Slick, M.D. PHYSICIAN: Orson Slick, M.D.  DATA: Ms. Bartelt is a 44 year old female with a history of bipolar disorder.  COMPLAINT: "People are shooting at me. I have to call 911."  OF PRESENT ILLNESS: Ms. Heinke was hospitalized at [facility] for a manic episode and was discharged just one week ago. She returned for her scheduled follow up appointment at my office with her husband yesterday. She was not compliant with antipsychotics since discharge as she did not buy her medications. The husband was unaware. She deteriorated in spite of therapeutic lithium level and compliance with lamictal. She became more paranoid. She did not sleep at all and has been calling 911 repeatedly believing that she is constantly under attack from "arians". She has auditory hallucinations. She is not suicidal but the husband worries about his safety and the safety of his son as Zarinah is so unreasonable.   PSYCHIATRIC HISTORY: There were several hospitalizations for exacerbation of psychosis and mood swings with symptoms of mania and paranoid delusions. She was hospitalized for the first time in her 53s. She was forced to take disability from a very successful career. There were no suicide attempts.  PSYCHIATRIC HISTORY: She has a mother with bipolar disorder. Her 57-year-old son was diagnosed with ADHD recently and is at increased risk of bipolar illness. MEDICAL HISTORY:  1. Diabetes.   2. Gastroesophageal reflux disease.  Dyslipidemia.  Hypothyroidism.  Sleep apnea. B12 deficiency.  ALLERGIES: Abilify, Perphenazine, Sulfa drugs, PCN.   ON ADMISSION:  1. Aspirin 81 mg daily.  2. Navane 5 mg three times daily.  Enalapril 2.5 mg daily. Zyprexa 10 mg at night.  Lithobid 600 mg twice daily.    Restoril 15 mg at night.   Amantadine 100 mg twice daily.  Pravachol 20 mg at  bedtime. Vitamin B12 1000 mcg daily.Klonopin 0.5 mg three times daily. Lamictal  200mg  at night.  BuSpar 10 mg three times daily.Albuterol as needed.Synthroid 0.075 mg daily.   SOCIAL HISTORY: The patient graduated from college. 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 She is back with her husband who takes care of [name], their young child.  The patient is on disability. The family experiences financial difficulties, including food shortage and transportation problems as they cannot afford car insurance.  She is a smoker. OF SYSTEMS: CONSTITUTIONAL: No fevers or chills. EYES: No double or blurred vision. ENT: No hearing loss. RESPIRATORY: Positive for cough and 0ccasional shortness of breath. CARDIOVASCULAR: No chest pain or orthopnea. GASTROINTESTINAL: No abdominal pain, nausea, vomiting, or diarrhea. GU: No incontinence or frequency. ENDOCRINE: Hypothyroidism. LYMPHATIC: No anemia or easy bruising. INTEGUMENTARY: No acne or rash. MUSCULOSKELETAL: No muscle or joint pain. NEUROLOGIC: No weakness or tingling. PSYCHIATRIC: See history of present illness for details.  EXAMINATION: VITAL SIGNS: Blood pressure 129/87, pulse 74, respirations 20, temperature 98.2. GENERAL: This is a young female, anxious appearing. HEENT: The pupils are equal, round, and reactive to light. Sclera anicteric. NECK: Supple. No thyromegaly. LUNGS: Clear to auscultation. No dullness to percussion. HEART: Regular rhythm and rate. No murmurs, rubs, or gallops. ABDOMEN: Soft, nontender, nondistended. Positive bowel sounds. MUSCULOSKELETAL: No stiffness or weakness. SKIN: No lesions. LYMPHATIC: No cervical adenopathy. NEUROLOGIC: Cranial nerves II through XII are intact. Normal gait. DIAGNOSTIC AND RADIOLOGICAL DATA: Chemistries, LFTs, CBC are within normal limits except Glu 106 and WBC 15.7. TSH 1.73. Lithium level  0.83. Urinalysis is suggestive of urinary tract infection. Pregnancy  test not done. STATUS EXAMINATION ON ADMISSION: The patient is alert and oriented to person, place, time, and situation. She polite but constantly argues with her husband when he challenges her delusions. She is well groomed and casually dressed. She looks tired. She maintains good eye contact. Her mood is "scared" with expansive affect. Thought processing is disorganized and influenced by paranoid delusions. There are auditory and possibly visual hallucinations. Her cognition is grossly intact. Her insight and judgment are limited.  ASSESSMENT ON ADMISSION: This is a patient with a history of psychosis and mood instability who became floridly psychotic in the context of medication noncompliance.     DIAGNOSES: I:  Bipolar disorder most recent episode mania with psychosis. II:  Deferred.XIS III:  H/O Lithium toxicity, Hypothyroidism, Hypertension, Hypothyroidism, Dyslipidemia, Sleep apnea. IV:  Chronic mental and physical illness, marital, family problems, finances. V:  GAF on admission 20.   PLAN:  The patient was admitted to [facility] for safety, stabilization, and medication management. She was initially placed on suicide precautions and was closely monitored for any unsafe behaviors. She underwent full psychiatric and risk assessment. At no time during current hospitalization was the patient suicidal, homicidal or violent. She actively participated in her treatment. She received pharmacotherapy, individual and group psychotherapy, and support from therapeutic milieu.   1. Mood/psychosis. She will continued Navane 5 mg three times daily. We will add Latuda for its antipsychotic and colming effect, favorable metabolic profile and availability from samples . We will continue Lithium and Lamictal.   Medical: We continued treatment of HTN, dyslipidemia, and hypothyroidism.   DISPOSITION: She will be discharged to home. She will follow up with Dr. Bary Leriche.       Electronic Signatures: Orson Slick (MD)  (Signed on 24-Jan-13 21:56)  Authored  Last Updated: 24-Jan-13 21:56 by Orson Slick (MD)

## 2015-06-30 ENCOUNTER — Emergency Department: Payer: Medicare Other

## 2015-06-30 ENCOUNTER — Encounter: Payer: Self-pay | Admitting: Emergency Medicine

## 2015-06-30 ENCOUNTER — Emergency Department
Admission: EM | Admit: 2015-06-30 | Discharge: 2015-06-30 | Disposition: A | Discharge status: Home / Self Care | Payer: Medicare Other | Attending: Emergency Medicine | Admitting: Emergency Medicine

## 2015-06-30 DIAGNOSIS — Z23 Encounter for immunization: Secondary | ICD-10-CM | POA: Insufficient documentation

## 2015-06-30 DIAGNOSIS — Y998 Other external cause status: Secondary | ICD-10-CM | POA: Insufficient documentation

## 2015-06-30 DIAGNOSIS — K029 Dental caries, unspecified: Secondary | ICD-10-CM | POA: Diagnosis not present

## 2015-06-30 DIAGNOSIS — Y92480 Sidewalk as the place of occurrence of the external cause: Secondary | ICD-10-CM | POA: Insufficient documentation

## 2015-06-30 DIAGNOSIS — Y9301 Activity, walking, marching and hiking: Secondary | ICD-10-CM | POA: Insufficient documentation

## 2015-06-30 DIAGNOSIS — S0083XA Contusion of other part of head, initial encounter: Secondary | ICD-10-CM | POA: Insufficient documentation

## 2015-06-30 DIAGNOSIS — Z79899 Other long term (current) drug therapy: Secondary | ICD-10-CM | POA: Insufficient documentation

## 2015-06-30 DIAGNOSIS — Z880 Allergy status to penicillin: Secondary | ICD-10-CM | POA: Insufficient documentation

## 2015-06-30 DIAGNOSIS — S00531A Contusion of lip, initial encounter: Secondary | ICD-10-CM | POA: Diagnosis not present

## 2015-06-30 DIAGNOSIS — Z720 Tobacco use: Secondary | ICD-10-CM | POA: Diagnosis not present

## 2015-06-30 DIAGNOSIS — W01198A Fall on same level from slipping, tripping and stumbling with subsequent striking against other object, initial encounter: Secondary | ICD-10-CM | POA: Insufficient documentation

## 2015-06-30 DIAGNOSIS — S0990XA Unspecified injury of head, initial encounter: Secondary | ICD-10-CM | POA: Diagnosis present

## 2015-06-30 DIAGNOSIS — F1010 Alcohol abuse, uncomplicated: Secondary | ICD-10-CM | POA: Insufficient documentation

## 2015-06-30 DIAGNOSIS — W19XXXA Unspecified fall, initial encounter: Secondary | ICD-10-CM

## 2015-06-30 HISTORY — DX: Bipolar disorder, unspecified: F31.9

## 2015-06-30 LAB — COMPREHENSIVE METABOLIC PANEL
ALBUMIN: 4.1 g/dL (ref 3.5–5.0)
ALK PHOS: 90 U/L (ref 38–126)
ALT: 22 U/L (ref 14–54)
AST: 23 U/L (ref 15–41)
Anion gap: 12 (ref 5–15)
BILIRUBIN TOTAL: 0.4 mg/dL (ref 0.3–1.2)
BUN: 8 mg/dL (ref 6–20)
CO2: 22 mmol/L (ref 22–32)
Calcium: 8.8 mg/dL — ABNORMAL LOW (ref 8.9–10.3)
Chloride: 104 mmol/L (ref 101–111)
Creatinine, Ser: 0.7 mg/dL (ref 0.44–1.00)
GFR calc Af Amer: >60 mL/min (ref >60)
GFR calc non Af Amer: >60 mL/min (ref >60)
Glucose, Bld: 107 mg/dL — ABNORMAL HIGH (ref 65–99)
Potassium: 3.7 mmol/L (ref 3.5–5.1)
SODIUM: 138 mmol/L (ref 135–145)
TOTAL PROTEIN: 7.5 g/dL (ref 6.5–8.1)

## 2015-06-30 LAB — CBC WITH DIFFERENTIAL/PLATELET
Basophils Absolute: 0.1 10*3/uL (ref 0–0.1)
Basophils Relative: 1 %
EOS ABS: 0.1 10*3/uL (ref 0–0.7)
Eosinophils Relative: 1 %
HCT: 45.3 % (ref 35.0–47.0)
HEMOGLOBIN: 14.9 g/dL (ref 12.0–16.0)
LYMPHS PCT: 27 %
Lymphs Abs: 3.2 10*3/uL (ref 1.0–3.6)
MCH: 31.2 pg (ref 26.0–34.0)
MCHC: 32.8 g/dL (ref 32.0–36.0)
MCV: 95 fL (ref 80.0–100.0)
MONOS PCT: 7 %
Monocytes Absolute: 0.8 10*3/uL (ref 0.2–0.9)
NEUTROS PCT: 64 %
Neutro Abs: 7.7 10*3/uL — ABNORMAL HIGH (ref 1.4–6.5)
Platelets: 190 10*3/uL (ref 150–440)
RBC: 4.77 MIL/uL (ref 3.80–5.20)
RDW: 13.8 % (ref 11.5–14.5)
WBC: 11.8 10*3/uL — ABNORMAL HIGH (ref 3.6–11.0)

## 2015-06-30 LAB — ETHANOL: ALCOHOL ETHYL (B): 198 mg/dL — AB (ref <5)

## 2015-06-30 MED ORDER — TETANUS-DIPHTH-ACELL PERTUSSIS 5-2.5-18.5 LF-MCG/0.5 IM SUSP
INTRAMUSCULAR | Status: AC
Start: 2015-06-30 — End: 2015-06-30
  Administered 2015-06-30: 0.5 mL via INTRAMUSCULAR
  Filled 2015-06-30: qty 0.5

## 2015-06-30 MED ORDER — TETANUS-DIPHTH-ACELL PERTUSSIS 5-2.5-18.5 LF-MCG/0.5 IM SUSP
0.5000 mL | Freq: Once | INTRAMUSCULAR | Status: AC
  Administered 2015-06-30: 0.5 mL via INTRAMUSCULAR

## 2015-06-30 NOTE — Discharge Instructions (Signed)
Alcohol Intoxication Alcohol intoxication occurs when you drink enough alcohol that it affects your ability to function. It can be mild or very severe. Drinking a lot of alcohol in a short time is called binge drinking. This can be very harmful. Drinking alcohol can also be more dangerous if you are taking medicines or other drugs. Some of the effects caused by alcohol may include:  Loss of coordination.  Changes in mood and behavior.  Unclear thinking.  Trouble talking (slurred speech).  Throwing up (vomiting).  Confusion.  Slowed breathing.  Twitching and shaking (seizures).  Loss of consciousness. HOME CARE  Do not drive after drinking alcohol.  Drink enough water and fluids to keep your pee (urine) clear or pale yellow. Avoid caffeine.  Only take medicine as told by your doctor. GET HELP IF:  You throw up (vomit) many times.  You do not feel better after a few days.  You frequently have alcohol intoxication. Your doctor can help decide if you should see a substance use treatment counselor. GET HELP RIGHT AWAY IF:  You become shaky when you stop drinking.  You have twitching and shaking.  You throw up blood. It may look bright red or like coffee grounds.  You notice blood in your poop (bowel movements).  You become lightheaded or pass out (faint). MAKE SURE YOU:   Understand these instructions.  Will watch your condition.  Will get help right away if you are not doing well or get worse. Document Released: 05/29/2008 Document Revised: 08/13/2013 Document Reviewed: 05/16/2013 [facility] Patient Information 2015 [place], [place]. This information is not intended to replace advice given to you by your health care provider. Make sure you discuss any questions you have with your health care provider.  Please return for any further problems or as needed

## 2015-06-30 NOTE — ED Notes (Signed)
Arrived to ED via EMS.  EMS states patient has been drinking today and was [place].  Patient admits to drinking three beers today and states while walking home she lost balance and fell onto the sidewalk.  Denies LOC.

## 2015-06-30 NOTE — ED Notes (Signed)
AAOx3.  Skin warm and dry.  NAD.  Moving all extremities equally and strong.  Calm and cooperative.  D/C home with father.

## 2015-06-30 NOTE — ED Provider Notes (Signed)
[facility] Emergency Department Provider Note  ____________________________________________  Time seen: Approximately 9:09 PM  I have reviewed the triage vital signs and the nursing notes.   HISTORY  Chief Complaint Fall    HPI April Luna is a 44 y.o. female patient was seen by witnesses to be walking along the sidewalk after having drank some beer she fell and hit her head. Patient denies loss of consciousness. Patient is drunk clinically. Patient's on-call of 198 comes of her inebriation and the fact she fell and hit her head CT her head or neck disease were negative. Patient denied any severe headache today complaining of some facial soreness. She denied any chest abdomen or pelvis pain she denied any pain in her arms or legs or back.   Past Medical History  Diagnosis Date  . Hypothyroidism   . Bipolar 1 disorder     Patient Active Problem List   Diagnosis Date Noted  . Hypothyroidism 04/26/2015  . Bipolar I disorder, most recent episode (or current) manic 04/25/2015  . Delirium due to another medical condition 04/25/2015  . Cannabis abuse 04/25/2015    History reviewed. No pertinent past surgical history.  Current Outpatient Rx  Name  Route  Sig  Dispense  Refill  . lactulose (CHRONULAC) 10 GM/15ML solution   Oral   Take 15 mLs (10 g total) by mouth at bedtime.   240 mL   0   . levothyroxine (SYNTHROID, LEVOTHROID) 75 MCG tablet   Oral   Take 1 tablet (75 mcg total) by mouth daily at 6 (six) AM.   30 tablet   0   . nicotine (NICOTROL) 10 MG inhaler   Inhalation   Inhale 1 cartridge (1 continuous puffing total) into the lungs as needed for smoking cessation (Limit to 6-16 cartridges per 24 hr. Puff on cartridge for about 20 minutes).   42 each   0   . paliperidone (INVEGA SUSTENNA) 156 MG/ML SUSP injection   Intramuscular   Inject 1 mL (156 mg total) into the muscle once.   0.9 mL   0     Due on May 19.   . paliperidone  (INVEGA) 9 MG 24 hr tablet   Oral   Take 1 tablet (9 mg total) by mouth at bedtime.   30 tablet   0   . pantoprazole (PROTONIX) 40 MG tablet   Oral   Take 1 tablet (40 mg total) by mouth daily at 6 (six) AM.   30 tablet   0   . traZODone (DESYREL) 100 MG tablet   Oral   Take 1 tablet (100 mg total) by mouth at bedtime as needed for sleep.   39 tablet   0     Allergies Penicillins; Perphenazine; Sulfa antibiotics; and Abilify  No family history on file.  Social History History  Substance Use Topics  . Smoking status: Current Every Day Smoker -- 1.50 packs/day    Types: Cigarettes  . Smokeless tobacco: Not on file  . Alcohol Use: 7.2 oz/week    0 Standard drinks or equivalent, 12 Cans of beer per week    Review of Systems Constitutional: No fever/chills Eyes: No visual changes. ENT: No sore throat. Cardiovascular: Denies chest pain. Respiratory: Denies shortness of breath. Gastrointestinal: No abdominal pain.  No nausea, no vomiting.  No diarrhea.  No constipation. Genitourinary: Negative for dysuria. Musculoskeletal: Negative for back pain. Skin: Negative for rash. Neurological: Negative for headaches, focal weakness or numbness.  10-point  ROS otherwise negative.  ____________________________________________   PHYSICAL EXAM:  VITAL SIGNS: ED Triage Vitals  Enc Vitals Group     BP 06/30/15 1857 125/86 mmHg     Pulse Rate 06/30/15 1857 111     Resp 06/30/15 1857 18     Temp 06/30/15 1857 98.2 F (36.8 C)     Temp Source 06/30/15 1857 Oral     SpO2 06/30/15 1857 94 %     Weight 06/30/15 1857 140 lb (63.504 kg)     Height 06/30/15 1857 4\' 11"  (1.499 m)     Head Cir --      Peak Flow --      Pain Score 06/30/15 1858 2     Pain Loc --      Pain Edu? --      Excl. in [place]? --   Constitutional: Alert and oriented. Well appearing and in no acute distress. Eyes: Conjunctivae are normal. PERRL. EOMI. Head: Atraumatic. Nose: No congestion/rhinnorhea. No  tenderness. No bleeding. No septal hematoma. Mouth/Throat: Mucous membranes are moist.  Oropharynx non-erythematous. Teeth show no fractures there are some teeth with dental caries there. Patient has bruises on her lips but has not cut her lips. Looks intact. Geryl Councilman is also a bruise and abraded. Neck: No stridor. No cervical spine tenderness to palpation however patient is inebriated Cardiovascular: Normal rate, regular rhythm. Grossly normal heart sounds.  Good peripheral circulation. Respiratory: Normal respiratory effort.  No retractions. Lungs CTAB. There is no chest wall tenderness Gastrointestinal: Soft and nontender. No distention. No abdominal bruits. No CVA tenderness. Musculoskeletal: No lower extremity tenderness nor edema.  No joint effusions. There is no extremity tenderness or back tenderness or hip tenderness Neurologic:  Normal speech and language. No gross focal neurologic deficits are appreciated. Speech is normal. No gait instability. Skin:  Skin is warm, dry and intact. No rash noted. Psychiatric: Mood and affect are normal.   ____________________________________________   LABS (all labs ordered are listed, but only abnormal results are displayed)  Labs Reviewed  ETHANOL - Abnormal; Notable for the following:    Alcohol, Ethyl (B) 198 (*)    All other components within normal limits  COMPREHENSIVE METABOLIC PANEL - Abnormal; Notable for the following:    Glucose, Bld 107 (*)    Calcium 8.8 (*)    All other components within normal limits  CBC WITH DIFFERENTIAL/PLATELET - Abnormal; Notable for the following:    WBC 11.8 (*)    Neutro Abs 7.7 (*)    All other components within normal limits   ____________________________________________  EKG   ____________________________________________  RADIOLOGY  CT of the head and neck are read as  normal ____________________________________________   PROCEDURES    ____________________________________________   INITIAL IMPRESSION / ASSESSMENT AND PLAN / ED COURSE  Pertinent labs & imaging results that were available during my care of the patient were reviewed by me and considered in my medical decision making (see chart for details).   ____________________________________________   FINAL CLINICAL IMPRESSION(S) / ED DIAGNOSES  Final diagnoses:  Fall, initial encounter  Alcohol abuse      Nena Polio, MD 06/30/15 2121

## 2015-06-30 NOTE — ED Notes (Signed)
AAOx3.  Skin warm and dry.  Patient called father, who will come to the ED to pick patient up and take her home.  Continue to monitor.

## 2016-01-21 IMAGING — CT CT HEAD WITHOUT CONTRAST
2 series · 14 of 30 positions shown, 16 images · non-contrast
Comparison: None.

CLINICAL DATA: Psychiatric patient, fell on face onto floor.
Confusion.

EXAM:
CT HEAD WITHOUT CONTRAST
TECHNIQUE: Contiguous axial images were obtained from the base of the skull
through the vertex without intravenous contrast.

[Series 2: head bone · axial · 0.39mm/px · z∈[-149,-15]mm · 8 of 83 slices shown]
[im 8/83  bone]
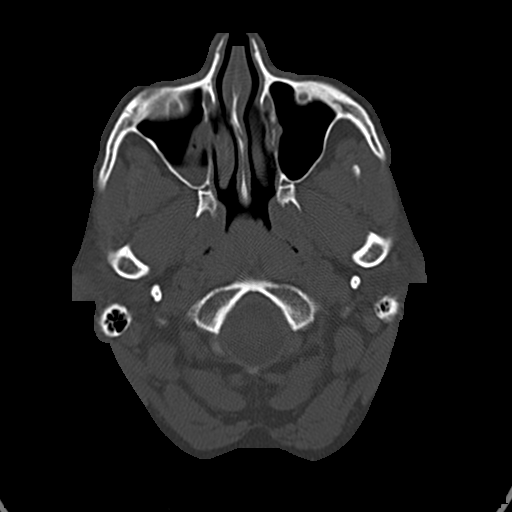
[im 16/83  bone]
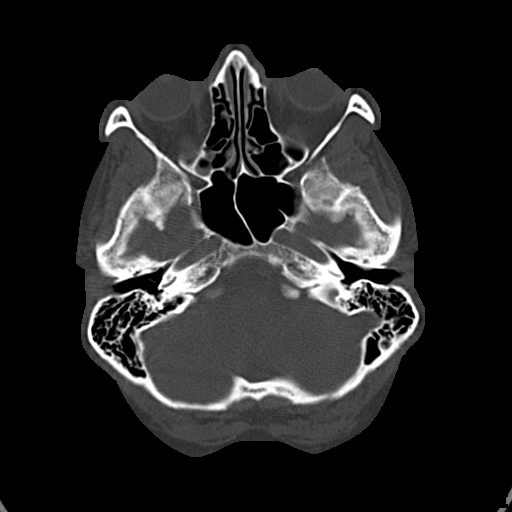
[im 28/83  bone]
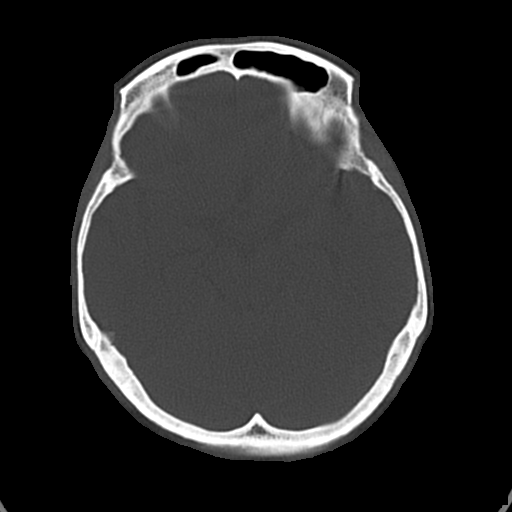
[im 36/83  bone]
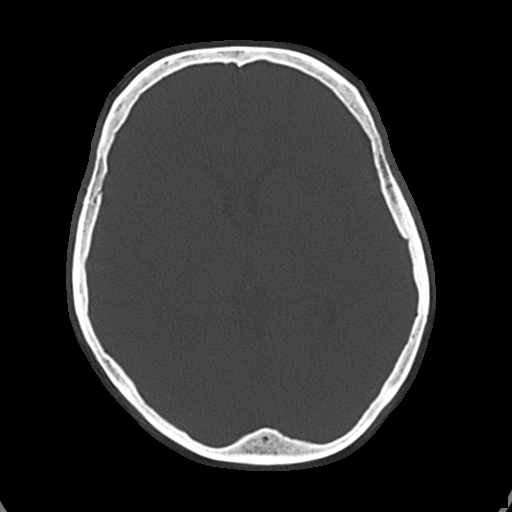
[im 47/83  bone]
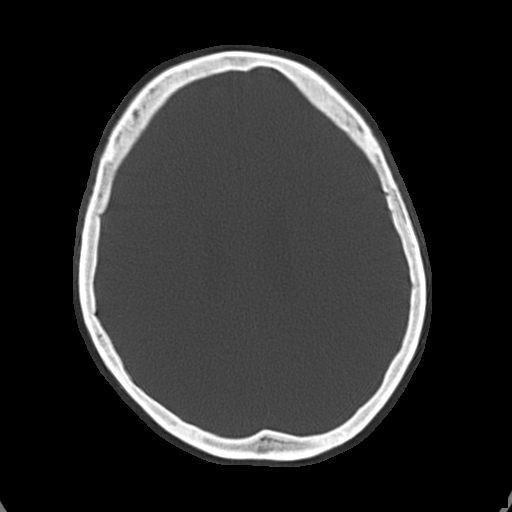
[im 55/83  bone]
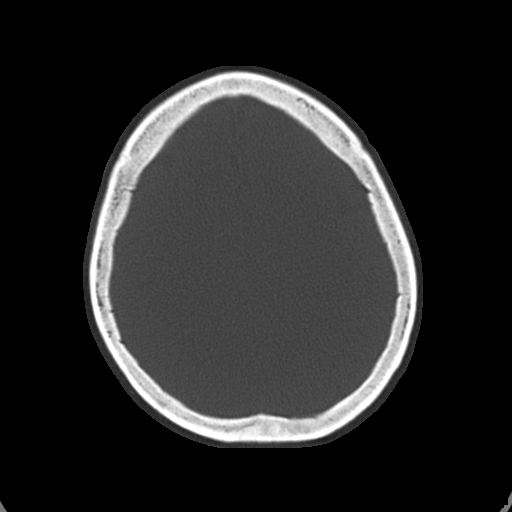
[im 67/83  bone]
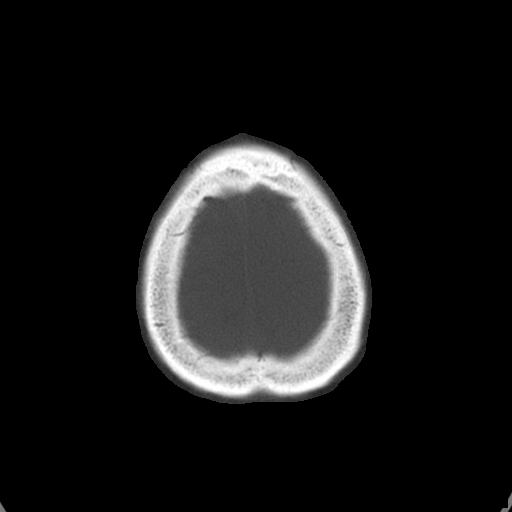
[im 75/83  bone]
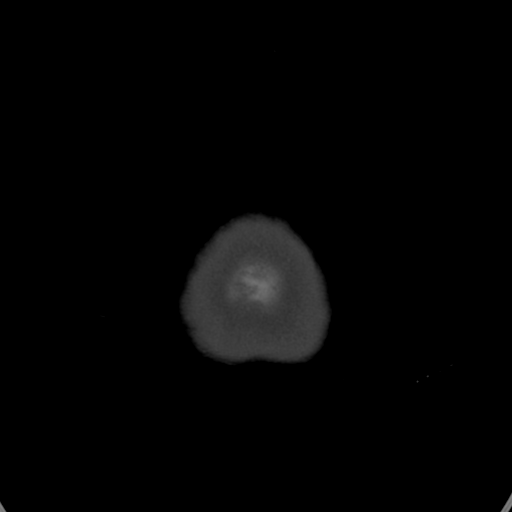

[Series 3: head wo · axial · 0.39mm/px · z∈[-134,-34]mm · 6 of 30 slices shown, 8 images]
[im 5/30  brain]
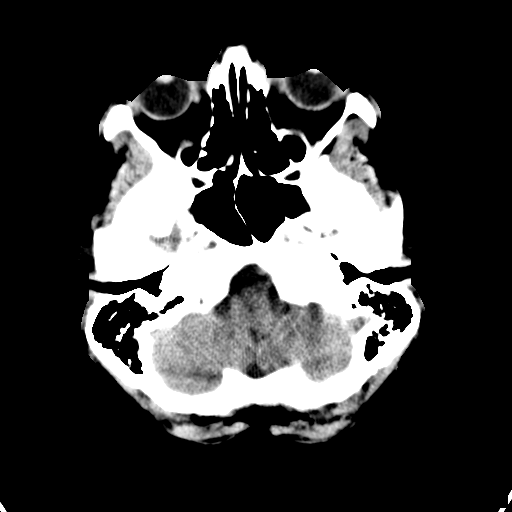
[im 5/30  bone]
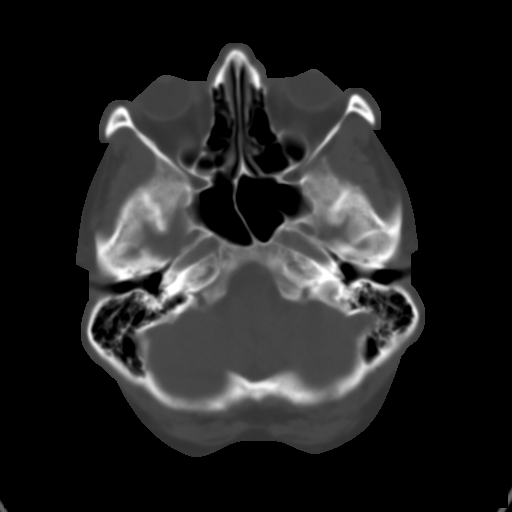
[im 9/30  brain]
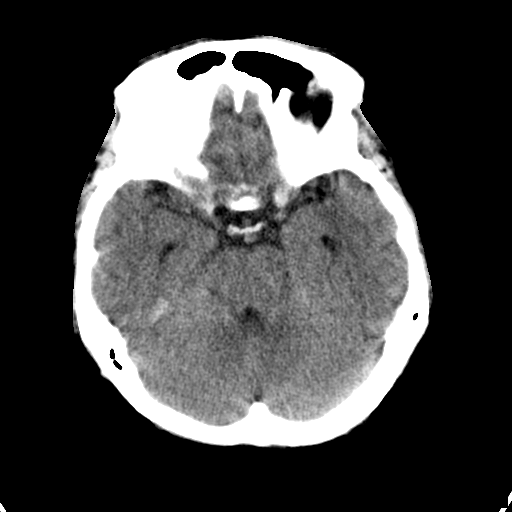
[im 13/30  brain]
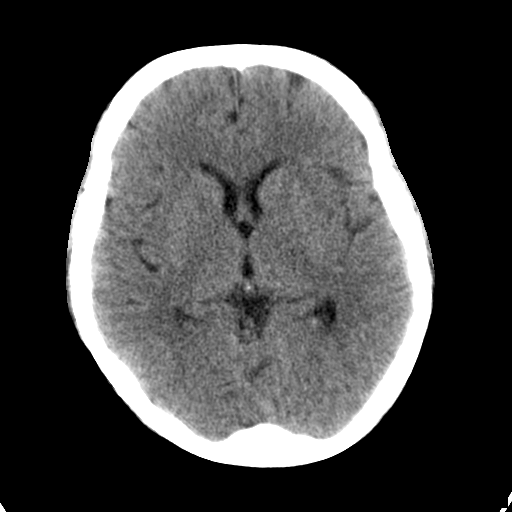
[im 17/30  brain]
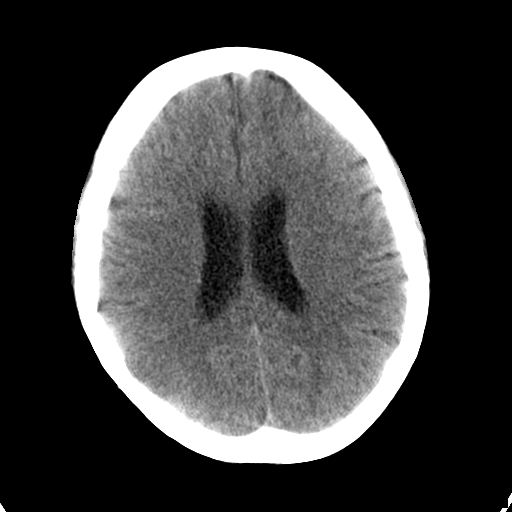
[im 21/30  brain]
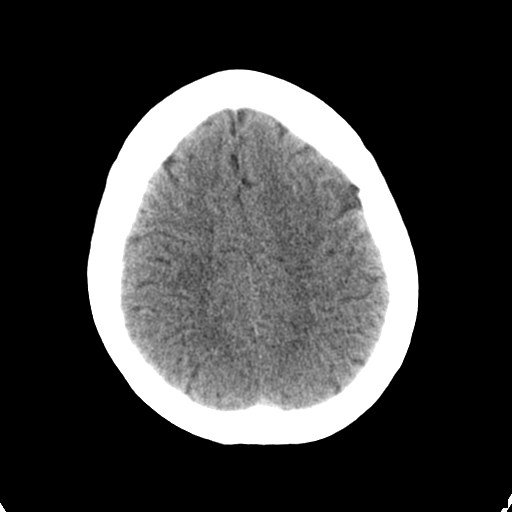
[im 21/30  bone]
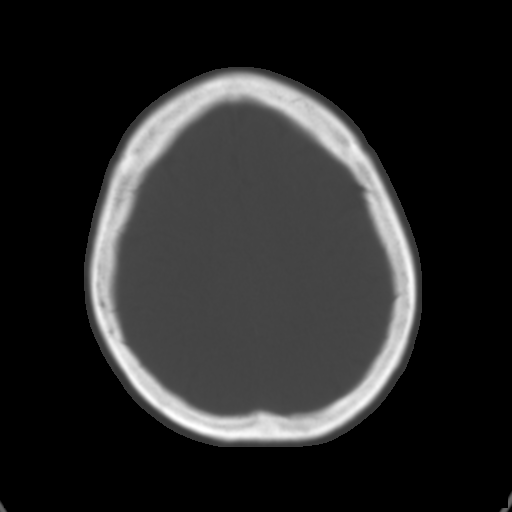
[im 25/30  brain]
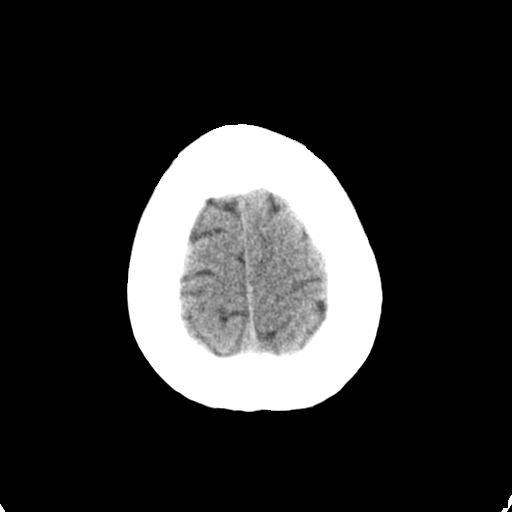

[14 of 30 positions shown; findings below may reference images not displayed]

FINDINGS: Mild motion degraded examination.

The ventricles and sulci are normal. No intraparenchymal hemorrhage,
mass effect nor midline shift. No acute large vascular territory
infarcts.

No abnormal extra-axial fluid collections. Basal cisterns are
patent.

No skull fracture. The included ocular globes and orbital contents
are non-suspicious. Mild maxillary sinus mucosal thickening with
small RIGHT maxillary sinus air fluid level. The mastoid air cells
are well aerated.
IMPRESSION: Normal mildly motion degraded noncontrast CT of the head.

Mild acute paranasal sinusitis.

By: Nor Azian Ringgo

## 2016-01-21 IMAGING — CT CT HEAD WITHOUT CONTRAST
3 series · 16 of 30 positions shown, 17 images · non-contrast
Comparison: 04/06/2015

CLINICAL DATA: Confusion. Patient had 2 falls this morning with
head trauma. Patient has been disoriented. Patient struck posterior
aspect the head and right side of the head.

EXAM:
CT HEAD WITHOUT CONTRAST
TECHNIQUE: Contiguous axial images were obtained from the base of the skull
through the vertex without intravenous contrast.

[Series 2: head bone · axial · 0.38mm/px · z∈[-171,-43]mm · 8 of 79 slices shown]
[im 10/79  bone]
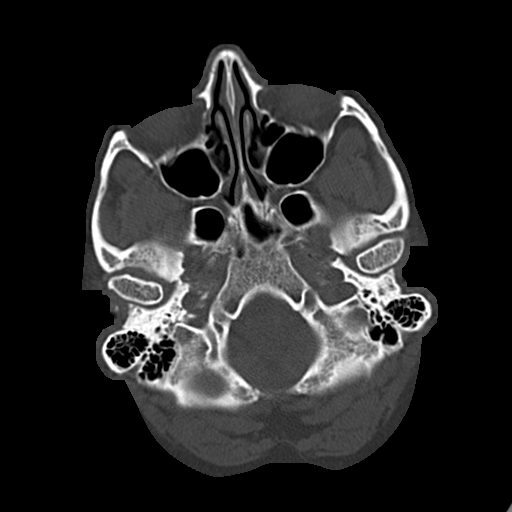
[im 19/79  bone]
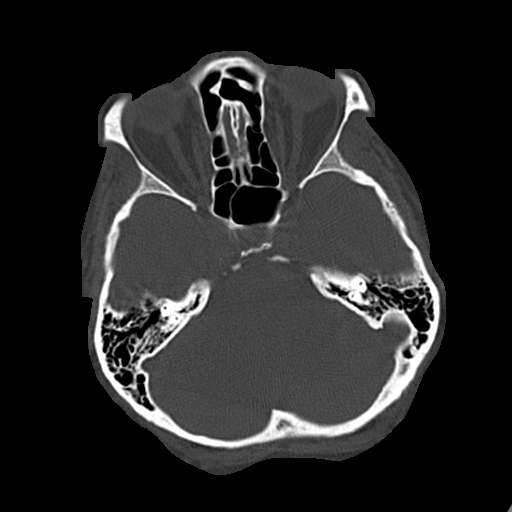
[im 28/79  bone]
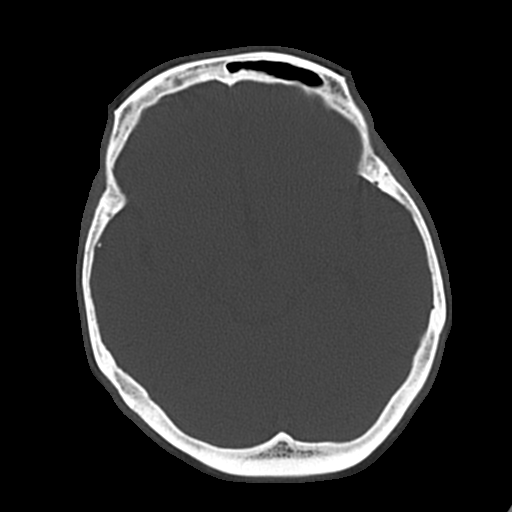
[im 37/79  bone]
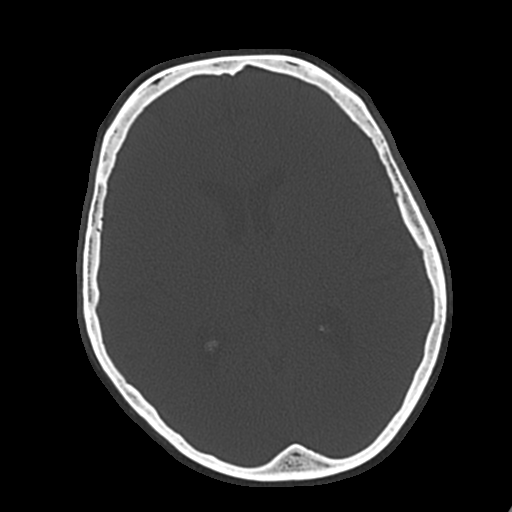
[im 46/79  bone]
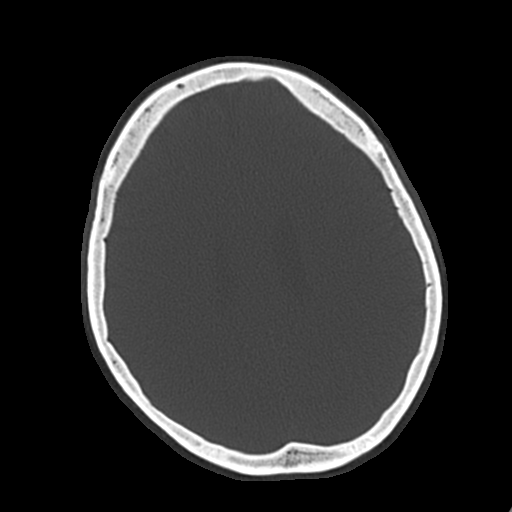
[im 56/79  bone]
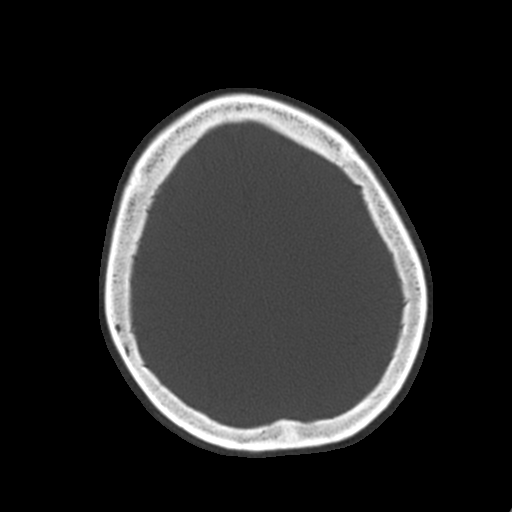
[im 65/79  bone]
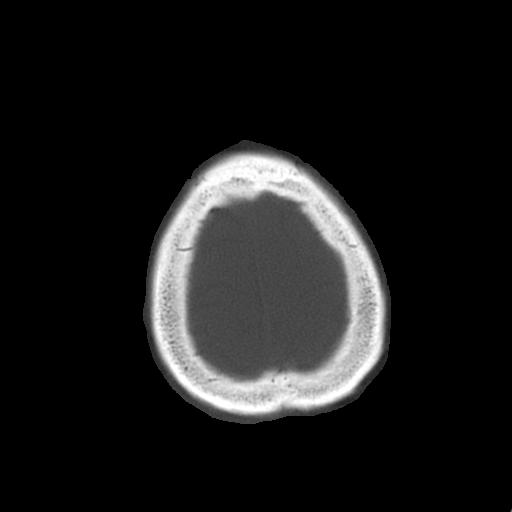
[im 74/79  bone]
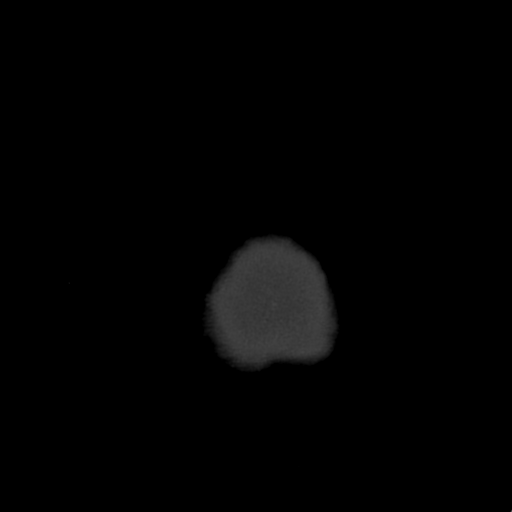

[Series 3: head wo · axial · 0.38mm/px · z∈[-155,-75]mm · 4 of 28 slices shown]
[im 6/28  brain]
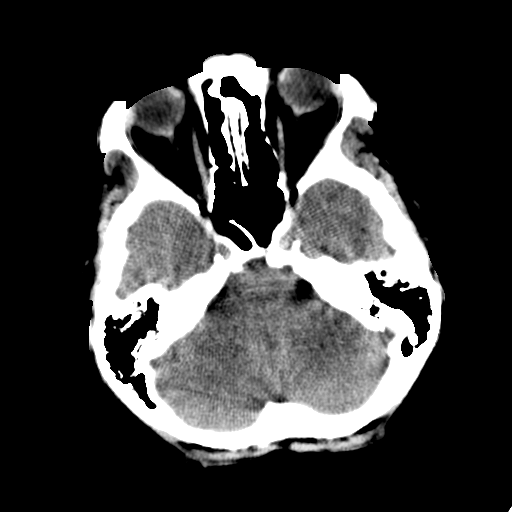
[im 11/28  brain]
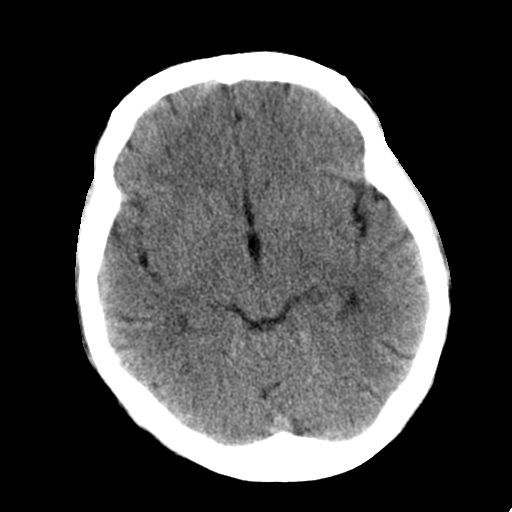
[im 17/28  brain]
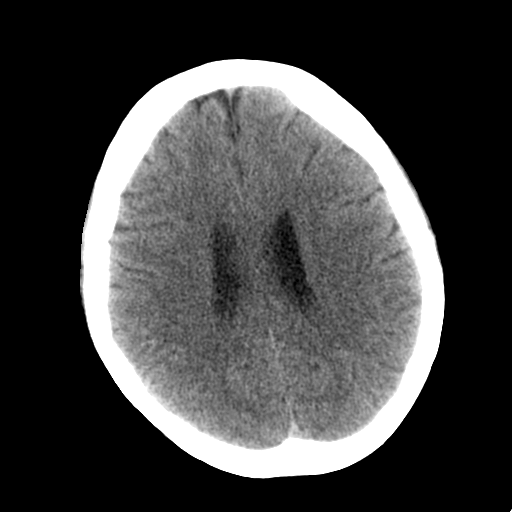
[im 22/28  brain]
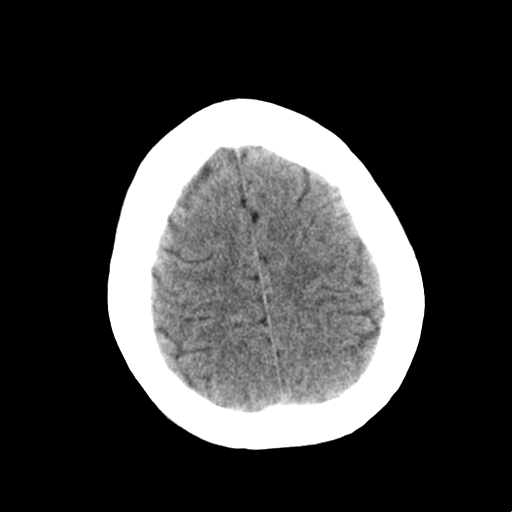

[Series 4: head wo-- · axial · 0.33mm/px · z∈[-138,-64]mm · 4 of 27 slices shown, 5 images]
[im 6/27  brain]
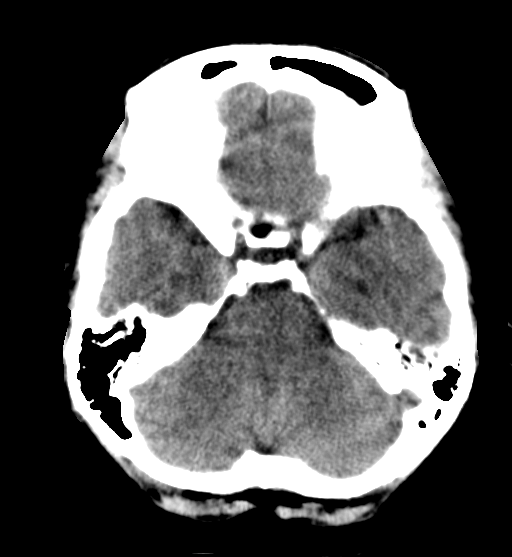
[im 6/27  bone]
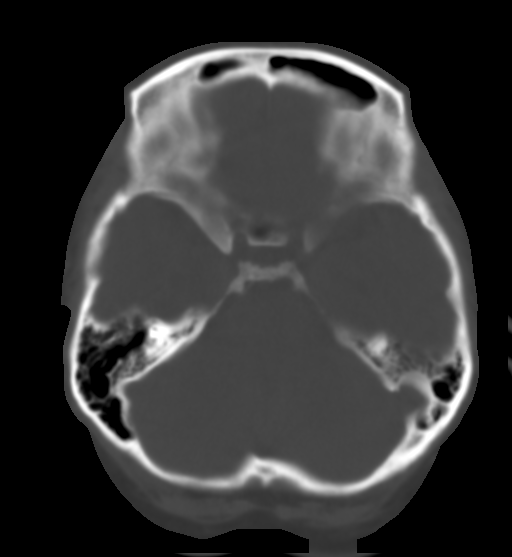
[im 11/27  brain]
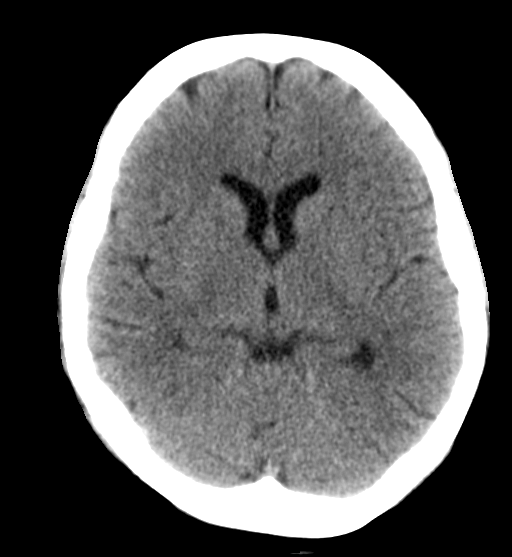
[im 16/27  brain]
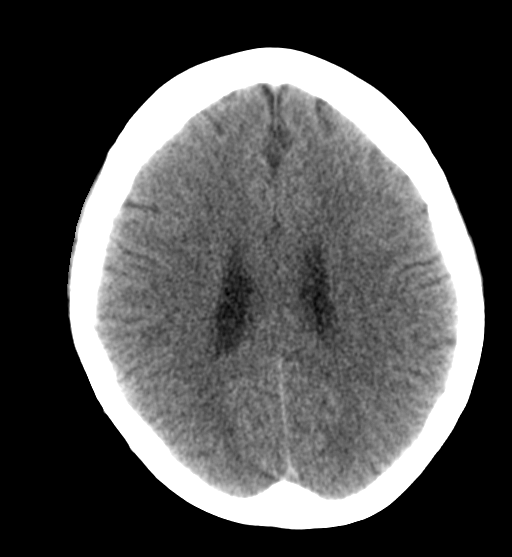
[im 21/27  brain]
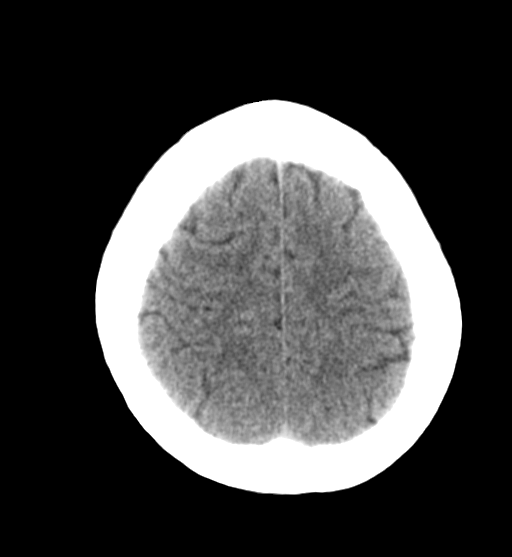

[16 of 30 positions shown; findings below may reference images not displayed]

FINDINGS: There is mild patient motion artifact. There is no intra or
extra-axial fluid collection or mass lesion. The basilar cisterns
and ventricles have a normal appearance. There is no CT evidence for
acute infarction or hemorrhage. Bone windows show mild mucosal
thickening of the maxillary sinuses. No air-fluid levels. There is
right posterior parietal scalp edema. No calvarial fracture.
IMPRESSION: 1.  No evidence for acute intracranial abnormality.
2. Mild chronic maxillary sinusitis.
3. Right posterior parietal scalp edema. No underlying calvarial
fracture.

## 2017-02-02 ENCOUNTER — Encounter: Payer: Self-pay | Admitting: Emergency Medicine

## 2017-02-02 ENCOUNTER — Emergency Department
Admission: EM | Admit: 2017-02-02 | Discharge: 2017-02-02 | Disposition: A | Discharge status: Home / Self Care | Payer: Medicare Other | Attending: Emergency Medicine | Admitting: Emergency Medicine

## 2017-02-02 ENCOUNTER — Emergency Department: Payer: Medicare Other

## 2017-02-02 DIAGNOSIS — E039 Hypothyroidism, unspecified: Secondary | ICD-10-CM | POA: Diagnosis not present

## 2017-02-02 DIAGNOSIS — F17210 Nicotine dependence, cigarettes, uncomplicated: Secondary | ICD-10-CM | POA: Diagnosis not present

## 2017-02-02 DIAGNOSIS — R531 Weakness: Secondary | ICD-10-CM | POA: Diagnosis present

## 2017-02-02 DIAGNOSIS — Z79899 Other long term (current) drug therapy: Secondary | ICD-10-CM | POA: Insufficient documentation

## 2017-02-02 DIAGNOSIS — R251 Tremor, unspecified: Secondary | ICD-10-CM | POA: Insufficient documentation

## 2017-02-02 LAB — BASIC METABOLIC PANEL
ANION GAP: 9 (ref 5–15)
BUN: 11 mg/dL (ref 6–20)
CHLORIDE: 104 mmol/L (ref 101–111)
CO2: 26 mmol/L (ref 22–32)
Calcium: 9.2 mg/dL (ref 8.9–10.3)
Creatinine, Ser: 1.13 mg/dL — ABNORMAL HIGH (ref 0.44–1.00)
GFR calc Af Amer: >60 mL/min (ref >60)
GFR calc non Af Amer: 58 mL/min — ABNORMAL LOW (ref >60)
GLUCOSE: 87 mg/dL (ref 65–99)
POTASSIUM: 4.1 mmol/L (ref 3.5–5.1)
Sodium: 139 mmol/L (ref 135–145)

## 2017-02-02 LAB — CBC WITH DIFFERENTIAL/PLATELET
BASOS ABS: 0.1 10*3/uL (ref 0–0.1)
Basophils Relative: 1 %
Eosinophils Absolute: 0.2 10*3/uL (ref 0–0.7)
Eosinophils Relative: 2 %
HEMATOCRIT: 36.5 % (ref 35.0–47.0)
Hemoglobin: 12.2 g/dL (ref 12.0–16.0)
LYMPHS ABS: 2.5 10*3/uL (ref 1.0–3.6)
Lymphocytes Relative: 24 %
MCH: 28.6 pg (ref 26.0–34.0)
MCHC: 33.4 g/dL (ref 32.0–36.0)
MCV: 85.7 fL (ref 80.0–100.0)
Monocytes Absolute: 0.6 10*3/uL (ref 0.2–0.9)
Monocytes Relative: 6 %
NEUTROS ABS: 7.3 10*3/uL — AB (ref 1.4–6.5)
Neutrophils Relative %: 67 %
Platelets: 244 10*3/uL (ref 150–440)
RBC: 4.25 MIL/uL (ref 3.80–5.20)
RDW: 18.4 % — ABNORMAL HIGH (ref 11.5–14.5)
WBC: 10.7 10*3/uL (ref 3.6–11.0)

## 2017-02-02 MED ORDER — SODIUM CHLORIDE 0.9 % IV BOLUS (SEPSIS)
500.0000 mL | Freq: Once | INTRAVENOUS | Status: AC
  Administered 2017-02-02: 500 mL via INTRAVENOUS

## 2017-02-02 NOTE — ED Triage Notes (Signed)
C/O arm tremors and leg "jumps" x 1 day.  Patient states the last time she had this symptoms she was dehydrated.

## 2017-02-02 NOTE — ED Provider Notes (Signed)
Time Seen: Approximately 1810  I have reviewed the triage notes  Chief Complaint: Tremors   History of Present Illness: April Luna is a 46 y.o. female *who presents with some vague symptoms of feeling that she gets some arm tremors and her leg jumps "". She states this is been occurring over the last day. She also describes some subjective left upper and lower extremity weakness. He yet is able to ambulate, etc. She denies any headaches or trouble with speech or swallowing. She describes some vague intermittent lower middle quadrant abdominal pain but none current. Past Medical History:  Diagnosis Date  . Bipolar 1 disorder ([place])   . Hypothyroidism     Patient Active Problem List   Diagnosis Date Noted  . Hypothyroidism 04/26/2015  . Bipolar I disorder, most recent episode (or current) manic ([place]) 04/25/2015  . Delirium due to another medical condition 04/25/2015  . Cannabis abuse 04/25/2015    History reviewed. No pertinent surgical history.  History reviewed. No pertinent surgical history.  Current Outpatient Rx  . Order #: [id number] Class: Print  . Order #: [id number] Class: Print  . Order #: [id number] Class: Print  . Order #: [id number] Class: Print  . Order #: [id number] Class: Print  . Order #: [id number] Class: Print  . Order #: [id number] Class: Print    Allergies:  Penicillins; Perphenazine; Sulfa antibiotics; and Abilify [aripiprazole]  Family History: No family history on file.  Social History: Social History  Substance Use Topics  . Smoking status: Current Every Day Smoker    Packs/day: 1.50    Types: Cigarettes  . Smokeless tobacco: Never Used  . Alcohol use 7.2 oz/week    12 Cans of beer per week     Review of Systems:   10 point review of systems was performed and was otherwise negative:  Constitutional: No fever Eyes: No visual disturbances ENT: No sore throat, ear pain Cardiac: No chest pain Respiratory: No shortness of breath, wheezing,  or stridor Abdomen: No Current abdominal pain, no vomiting, No diarrhea Endocrine: No weight loss, No night sweats Extremities: No peripheral edema, cyanosis Skin: No rashes, easy bruising Neurologic: No current focal weakness, trouble with speech or swollowing Urologic: No dysuria, Hematuria, or urinary frequency She denies any visual acuities issues or visual field deficits. She denies any ataxia  Physical Exam:  ED Triage Vitals  Enc Vitals Group     BP 02/02/17 1351 127/74     Pulse Rate 02/02/17 1351 93     Resp 02/02/17 1351 18     Temp 02/02/17 1351 98.5 F (36.9 C)     Temp Source 02/02/17 1351 Oral     SpO2 02/02/17 1351 97 %     Weight 02/02/17 1351 150 lb (68 kg)     Height 02/02/17 1351 4\' 11"  (1.499 m)     Head Circumference --      Peak Flow --      Pain Score 02/02/17 1357 0     Pain Loc --      Pain Edu? --      Excl. in [place]? --     General: Awake , Alert , and Oriented times 3; GCS 15 Head: Normal cephalic , atraumatic Eyes: Pupils equal , round, reactive to light Nose/Throat: No nasal drainage, patent upper airway without erythema or exudate.  Neck: Supple, Full range of motion, No anterior adenopathy or palpable thyroid masses Lungs: Clear to ascultation without wheezes , rhonchi, or rales Heart: Regular rate, regular rhythm without murmurs ,  gallops , or rubs Abdomen: Soft, non tender without rebound, guarding , or rigidity; bowel sounds positive and symmetric in all 4 quadrants. No organomegaly .        Extremities: 2 plus symmetric pulses. No edema, clubbing or cyanosis Neurologic: normal ambulation, Motor symmetric without deficits, sensory intact Skin: warm, dry, no rashes   Labs:   All laboratory work was reviewed including any pertinent negatives or positives listed below:  Labs Reviewed  CBC WITH DIFFERENTIAL/PLATELET - Abnormal; Notable for the following:       Result Value   RDW 18.4 (*)    Neutro Abs 7.3 (*)    All other components  within normal limits  BASIC METABOLIC PANEL - Abnormal; Notable for the following:    Creatinine, Ser 1.13 (*)    GFR calc non Af Amer 58 (*)    All other components within normal limits    Radiology: * "Ct Head Wo Contrast  Result Date: 02/02/2017 CLINICAL DATA:  Numbness in the legs for 1 day EXAM: CT HEAD WITHOUT CONTRAST TECHNIQUE: Contiguous axial images were obtained from the base of the skull through the vertex without intravenous contrast. COMPARISON:  06/30/2015 FINDINGS: Brain: No evidence of acute infarction, hemorrhage, hydrocephalus, extra-axial collection or mass lesion/mass effect. Vascular: No hyperdense vessel or unexpected calcification. Skull: Normal. Negative for fracture or focal lesion. Sinuses/Orbits: No acute finding. Other: None. IMPRESSION: No acute intracranial pathology. Electronically Signed   By: Kathreen Devoid   On: 02/02/2017 18:24  "  I personally reviewed the radiologic studies    ED Course:  Patient's stay here was uneventful is otherwise with no focal neurologic deficits. She describes some subjective left upper and lower extremity weakness and I felt this was unlikely to be acute transient ischemia based on her description of the symptoms that seems to be more of a paresthesia as if anything. Patient's a very vague scattered historian and felt most likely due to her previous mental illness. Her head CT appears normal here in emergency department and I felt we did not need to admit her at this time but advised her to take an aspirin and to contact her primary physician early next week for recheck and follow-up. 7. Elevated creatinine was given IV fluids here and states that she feels "" better "".     Assessment:  Paresthesias  Final Clinical Impression:  Final diagnoses:  Weakness     Plan:  Outpatient Patient was advised to return immediately if condition worsens. Patient was advised to follow up with their primary care physician or other  specialized physicians involved in their outpatient care. The patient and/or family member/power of attorney had laboratory results reviewed at the bedside. All questions and concerns were addressed and appropriate discharge instructions were distributed by the nursing staff.           Daymon Larsen, MD 02/02/17 Lurena Nida

## 2017-02-02 NOTE — ED Notes (Signed)
Left side tingluing arm and weak leg started yesterday evening

## 2017-02-02 NOTE — ED Notes (Signed)
D&C IV 

## 2017-02-02 NOTE — Discharge Instructions (Signed)
Please return immediately if condition worsens. Please contact her primary physician or the physician you were given for referral. If you have any specialist physicians involved in her treatment and plan please also contact them. Thank you for using [facility]. ° °

## 2018-01-24 ENCOUNTER — Emergency Department
Admission: EM | Admit: 2018-01-24 | Discharge: 2018-01-24 | Disposition: A | Discharge status: Home / Self Care | Payer: Medicare Other | Attending: Emergency Medicine | Admitting: Emergency Medicine

## 2018-01-24 ENCOUNTER — Other Ambulatory Visit: Payer: Self-pay

## 2018-01-24 DIAGNOSIS — N390 Urinary tract infection, site not specified: Secondary | ICD-10-CM | POA: Diagnosis not present

## 2018-01-24 DIAGNOSIS — E039 Hypothyroidism, unspecified: Secondary | ICD-10-CM | POA: Insufficient documentation

## 2018-01-24 DIAGNOSIS — Z79899 Other long term (current) drug therapy: Secondary | ICD-10-CM | POA: Diagnosis not present

## 2018-01-24 DIAGNOSIS — F17210 Nicotine dependence, cigarettes, uncomplicated: Secondary | ICD-10-CM | POA: Insufficient documentation

## 2018-01-24 DIAGNOSIS — M545 Low back pain: Secondary | ICD-10-CM | POA: Diagnosis present

## 2018-01-24 LAB — URINALYSIS, COMPLETE (UACMP) WITH MICROSCOPIC
BILIRUBIN URINE: NEGATIVE
Bacteria, UA: NONE SEEN
Glucose, UA: NEGATIVE mg/dL
Ketones, ur: NEGATIVE mg/dL
Nitrite: NEGATIVE
Protein, ur: 30 mg/dL — AB
Specific Gravity, Urine: 1.013 (ref 1.005–1.030)
pH: 6 (ref 5.0–8.0)

## 2018-01-24 MED ORDER — NITROFURANTOIN MONOHYD MACRO 100 MG PO CAPS: 100.0000 mg | ORAL_CAPSULE | Freq: Two times a day (BID) | ORAL | 0 refills | Status: AC

## 2018-01-24 MED ORDER — DULOXETINE HCL 60 MG PO CPEP: 60.0000 mg | ORAL_CAPSULE | Freq: Two times a day (BID) | ORAL | 0 refills | Status: DC

## 2018-01-24 MED ORDER — IBUPROFEN 600 MG PO TABS: 600.0000 mg | ORAL_TABLET | Freq: Three times a day (TID) | ORAL | 0 refills | Status: DC | PRN

## 2018-01-24 NOTE — ED Notes (Signed)
Pt states she has been having back pain since October, she reports having a known kidney stone in her left kidney and has had dark urine, urgency and some incontinence with this but has not followed up with urology. Now having cough and congestion with wheezing, lungs with bilat. Wheezing noted, resp even and nonlabored. Recently took antibiotics for a tooth infection.

## 2018-01-24 NOTE — ED Triage Notes (Signed)
Pt c/o left lower back pain , states she has chronic issues but has not been able to get into see a doctor.

## 2018-01-24 NOTE — ED Provider Notes (Signed)
[facility] Emergency Department Provider Note  ___________________________________________   First MD Initiated Contact with Patient 01/24/18 [id number]     (approximate)  I have reviewed the triage vital signs and the nursing notes.   HISTORY  Chief Complaint Back Pain  HPI April Luna is a 47 y.o. female is here complaining of left lower back pain.  Patient states that she has chronic issues with her back and generally sees Dr. Brynda Greathouse for her back problems.  She has not made an appointment with the doctor taking over his practice but has spoke with the office staff.  She denies any urinary symptoms or history of kidney stones.  She denies any nausea, vomiting, fever or chills.  She has taken no over-the-counter medications for her back pain.  Patient rates her pain as 5/10.   Past Medical History:  Diagnosis Date  . Bipolar 1 disorder ([place])   . Hypothyroidism     Patient Active Problem List   Diagnosis Date Noted  . Hypothyroidism 04/26/2015  . Bipolar I disorder, most recent episode (or current) manic ([place]) 04/25/2015  . Delirium due to another medical condition 04/25/2015  . Cannabis abuse 04/25/2015    History reviewed. No pertinent surgical history.  Prior to Admission medications   Medication Sig Start Date End Date Taking? Authorizing Provider  ibuprofen (ADVIL,MOTRIN) 600 MG tablet Take 1 tablet (600 mg total) by mouth every 8 (eight) hours as needed. 01/24/18   Johnn Hai, PA-C  lactulose (CHRONULAC) 10 GM/15ML solution Take 15 mLs (10 g total) by mouth at bedtime. 04/28/15   Hildred Priest, MD  levothyroxine (SYNTHROID, LEVOTHROID) 75 MCG tablet Take 1 tablet (75 mcg total) by mouth daily at 6 (six) AM. 04/28/15   Hildred Priest, MD  nitrofurantoin, macrocrystal-monohydrate, (MACROBID) 100 MG capsule Take 1 capsule (100 mg total) by mouth 2 (two) times daily for 7 days. 01/24/18 01/31/18  Johnn Hai, PA-C    paliperidone (INVEGA SUSTENNA) 156 MG/ML SUSP injection Inject 1 mL (156 mg total) into the muscle once. 04/28/15   Hildred Priest, MD  paliperidone (INVEGA) 9 MG 24 hr tablet Take 1 tablet (9 mg total) by mouth at bedtime. 04/28/15   Hildred Priest, MD  pantoprazole (PROTONIX) 40 MG tablet Take 1 tablet (40 mg total) by mouth daily at 6 (six) AM. 04/28/15   Hildred Priest, MD  traZODone (DESYREL) 100 MG tablet Take 1 tablet (100 mg total) by mouth at bedtime as needed for sleep. 04/28/15   Hildred Priest, MD    Allergies Penicillins; Perphenazine; Sulfa antibiotics; and Abilify [aripiprazole]  No family history on file.  Social History Social History   Tobacco Use  . Smoking status: Current Every Day Smoker    Packs/day: 1.50    Types: Cigarettes  . Smokeless tobacco: Never Used  Substance Use Topics  . Alcohol use: Yes    Alcohol/week: 7.2 oz    Types: 12 Cans of beer per week  . Drug use: Yes    Types: Marijuana    Review of Systems Constitutional: No fever/chills Cardiovascular: Denies chest pain. Respiratory: Denies shortness of breath. Gastrointestinal: No abdominal pain.  No nausea, no vomiting. Genitourinary: Negative for dysuria. Musculoskeletal: Positive for low back pain. Skin: Negative for rash. Neurological: Negative for headaches, focal weakness or numbness. ____________________________________________   PHYSICAL EXAM:  VITAL SIGNS: ED Triage Vitals  Enc Vitals Group     BP 01/24/18 0817 (!) 150/92     Pulse Rate 01/24/18 0817  90     Resp 01/24/18 0817 18     Temp 01/24/18 0817 97.9 F (36.6 C)     Temp Source 01/24/18 0817 Oral     SpO2 01/24/18 0817 95 %     Weight 01/24/18 0818 165 lb (74.8 kg)     Height 01/24/18 0818 5' (1.524 m)     Head Circumference --      Peak Flow --      Pain Score 01/24/18 0817 5     Pain Loc --      Pain Edu? --      Excl. in [place]? --    Constitutional: Alert and oriented.  Well appearing and in no acute distress. Eyes: Conjunctivae are normal.  Head: Atraumatic. Neck: No stridor.   Cardiovascular: Normal rate, regular rhythm. Grossly normal heart sounds.  Good peripheral circulation. Respiratory: Normal respiratory effort.  No retractions. Lungs CTAB. Gastrointestinal: Soft and nontender. No distention.  No CVA tenderness. Musculoskeletal: Examination of the back there is no gross deformity noted.  There is no point tenderness on palpation of the thoracic or lumbar spine.  There is some generalized tenderness on palpation of the paravertebral muscles sacral area. Neurologic:  Normal speech and language. No gross focal neurologic deficits are appreciated.  Normal gait was noted. Skin:  Skin is warm, dry and intact.  Psychiatric: Mood and affect are normal. Speech and behavior are normal.  ____________________________________________   LABS (all labs ordered are listed, but only abnormal results are displayed)  Labs Reviewed  URINALYSIS, COMPLETE (UACMP) WITH MICROSCOPIC - Abnormal; Notable for the following components:      Result Value   Color, Urine YELLOW (*)    APPearance HAZY (*)    Hgb urine dipstick MODERATE (*)    Protein, ur 30 (*)    Leukocytes, UA LARGE (*)    Squamous Epithelial / LPF 0-5 (*)    All other components within normal limits    PROCEDURES  Procedure(s) performed: None  Procedures  Critical Care performed: No  ____________________________________________   INITIAL IMPRESSION / ASSESSMENT AND PLAN / ED COURSE Patient was made aware that urinalysis shows infection.  Patient was given a prescription for Macrobid twice daily for 7 days.  Ibuprofen 600 mg every 8 hours with food as needed for back pain.  She is call make an appointment with Dr. Jerene Dilling office for follow-up of her UTI in 10-14 days.  Patient is encouraged to increase fluids.  ____________________________________________   FINAL CLINICAL IMPRESSION(S) / ED  DIAGNOSES  Final diagnoses:  Acute urinary tract infection     ED Discharge Orders        Ordered    DULoxetine (CYMBALTA) 60 MG capsule  2 times daily,   Status:  Discontinued     01/24/18 1035    nitrofurantoin, macrocrystal-monohydrate, (MACROBID) 100 MG capsule  2 times daily     01/24/18 1044    ibuprofen (ADVIL,MOTRIN) 600 MG tablet  Every 8 hours PRN     01/24/18 1044       Note:  This document was prepared using Dragon voice recognition software and may include unintentional dictation errors.    Johnn Hai, PA-C 01/24/18 1537    Harvest Dark, MD 01/24/18 [phone]

## 2018-01-24 NOTE — Discharge Instructions (Addendum)
Call make an appointment with the doctor taking care of Dr. Jerene Dilling patients.  Make an appointment for 10-14 days to have your urine rechecked and back pain reassessed.  Take Macrobid twice a day for 7 days and ibuprofen as needed for back pain.  Increase fluids.

## 2018-02-28 DIAGNOSIS — R350 Frequency of micturition: Secondary | ICD-10-CM | POA: Diagnosis not present

## 2018-02-28 DIAGNOSIS — N3001 Acute cystitis with hematuria: Secondary | ICD-10-CM | POA: Diagnosis not present

## 2018-04-11 ENCOUNTER — Encounter (INDEPENDENT_AMBULATORY_CARE_PROVIDER_SITE_OTHER): Payer: Self-pay

## 2018-04-11 ENCOUNTER — Ambulatory Visit (INDEPENDENT_AMBULATORY_CARE_PROVIDER_SITE_OTHER): Payer: Medicare Other | Admitting: Family Medicine

## 2018-04-11 ENCOUNTER — Telehealth: Payer: Self-pay | Admitting: Family Medicine

## 2018-04-11 ENCOUNTER — Encounter: Payer: Self-pay | Admitting: Family Medicine

## 2018-04-11 VITALS — BP 150/90 | HR 93 | Resp 16 | Ht 58.66 in | Wt 159.1 lb

## 2018-04-11 DIAGNOSIS — K219 Gastro-esophageal reflux disease without esophagitis: Secondary | ICD-10-CM | POA: Diagnosis not present

## 2018-04-11 DIAGNOSIS — Z1239 Encounter for other screening for malignant neoplasm of breast: Secondary | ICD-10-CM

## 2018-04-11 DIAGNOSIS — Z1231 Encounter for screening mammogram for malignant neoplasm of breast: Secondary | ICD-10-CM

## 2018-04-11 DIAGNOSIS — J411 Mucopurulent chronic bronchitis: Secondary | ICD-10-CM | POA: Diagnosis not present

## 2018-04-11 DIAGNOSIS — E785 Hyperlipidemia, unspecified: Secondary | ICD-10-CM | POA: Insufficient documentation

## 2018-04-11 DIAGNOSIS — R7303 Prediabetes: Secondary | ICD-10-CM | POA: Diagnosis not present

## 2018-04-11 DIAGNOSIS — E782 Mixed hyperlipidemia: Secondary | ICD-10-CM | POA: Diagnosis not present

## 2018-04-11 DIAGNOSIS — R109 Unspecified abdominal pain: Secondary | ICD-10-CM

## 2018-04-11 DIAGNOSIS — Z114 Encounter for screening for human immunodeficiency virus [HIV]: Secondary | ICD-10-CM

## 2018-04-11 DIAGNOSIS — M6281 Muscle weakness (generalized): Secondary | ICD-10-CM

## 2018-04-11 DIAGNOSIS — I10 Essential (primary) hypertension: Secondary | ICD-10-CM | POA: Diagnosis not present

## 2018-04-11 DIAGNOSIS — Z131 Encounter for screening for diabetes mellitus: Secondary | ICD-10-CM

## 2018-04-11 DIAGNOSIS — H60392 Other infective otitis externa, left ear: Secondary | ICD-10-CM

## 2018-04-11 DIAGNOSIS — N390 Urinary tract infection, site not specified: Secondary | ICD-10-CM

## 2018-04-11 DIAGNOSIS — E032 Hypothyroidism due to medicaments and other exogenous substances: Secondary | ICD-10-CM

## 2018-04-11 DIAGNOSIS — Z87442 Personal history of urinary calculi: Secondary | ICD-10-CM | POA: Diagnosis not present

## 2018-04-11 LAB — POCT URINALYSIS DIPSTICK
Bilirubin, UA: NEGATIVE
Glucose, UA: NEGATIVE
Ketones, UA: NEGATIVE
Nitrite, UA: POSITIVE
SPEC GRAV UA: 1.025 (ref 1.010–1.025)
UROBILINOGEN UA: 0.2 U/dL
pH, UA: 5 (ref 5.0–8.0)

## 2018-04-11 MED ORDER — HYDROCHLOROTHIAZIDE 12.5 MG PO TABS: 12.5000 mg | ORAL_TABLET | Freq: Every day | ORAL | 0 refills | Status: DC

## 2018-04-11 MED ORDER — PANTOPRAZOLE SODIUM 40 MG PO TBEC: 40.0000 mg | DELAYED_RELEASE_TABLET | Freq: Every day | ORAL | 5 refills | Status: DC

## 2018-04-11 MED ORDER — OFLOXACIN 0.3 % OT SOLN: 5.0000 [drp] | Freq: Every day | OTIC | 0 refills | Status: DC

## 2018-04-11 MED ORDER — PRAVASTATIN SODIUM 20 MG PO TABS: 20.0000 mg | ORAL_TABLET | Freq: Every day | ORAL | 0 refills | Status: DC

## 2018-04-11 MED ORDER — CIPROFLOXACIN HCL 250 MG PO TABS: 250.0000 mg | ORAL_TABLET | Freq: Two times a day (BID) | ORAL | 0 refills | Status: DC

## 2018-04-11 MED ORDER — ALBUTEROL SULFATE HFA 108 (90 BASE) MCG/ACT IN AERS: 2.0000 | INHALATION_SPRAY | Freq: Four times a day (QID) | RESPIRATORY_TRACT | 0 refills | Status: DC | PRN

## 2018-04-11 MED ORDER — ALBUTEROL SULFATE (2.5 MG/3ML) 0.083% IN NEBU
2.5000 mg | INHALATION_SOLUTION | Freq: Once | RESPIRATORY_TRACT | Status: AC
  Administered 2018-04-11: 2.5 mg via RESPIRATORY_TRACT

## 2018-04-11 MED ORDER — UMECLIDINIUM-VILANTEROL 62.5-25 MCG/INH IN AEPB: 1.0000 | INHALATION_SPRAY | Freq: Every day | RESPIRATORY_TRACT | 2 refills | Status: DC

## 2018-04-11 NOTE — Progress Notes (Signed)
Name: April Luna   MRN: [id number]    DOB: 1971-10-13   Date:04/11/2018       Progress Note  Subjective  Chief Complaint  Chief Complaint  Patient presents with  . Establish Care    Patient is here to establish care. Her previous PCP has retired and she has been without medications for awhile.  . Nephrolithiasis    She recently had UTI and was on antibiotics. She does not think antibiotics cleared infection. And she thinks she has a kidney stone.  . Ear Problem    She has loss of hearing in her left ear. She thinks that it may be from wax build up. She has been trying to get it out.  . Dental Pain    She has a lot of tooth decay. She hasn't been able to find a dentist that she can afford at this time. She has information on how to obtain a dentist at a lower cost and will schedule appointment. Needs antiobiotics to get by.  . Stroke Symptoms    She thinks that she may have had a stroke one year ago.    HPI  Used to see Dr. Brynda Greathouse, however he retired Fall 2018 and she needs a new PCP  COPD; long history of smoking, not ready to quit, she has daily cough and SOB, spirometry showed normal FEV/FVC . Discussed options with patient and importance of quitting smoking  Nephrolithiasis: she was seeing Dr. Erlene Quan, but would like to see someone else, she had a CT that showed a stone, she was seen by urgent care about one month ago and was given Macrobid, but continues to have left lower quadrant pain, dysuria, urine is dark with debris and has an odor.   Ear pain and fullness: she states difficulty hearing from left side in the past month, no fever but has occasional chills, trying otc debrox without help  Dental pain: we will treat other problems today  Stroke symptoms: it happened about one year ago, told to go to [facility] but CT was not done at the time, she states since than continues to have weakness on right arm, sometimes drops objects and has been dragging right leg. She is on statin  therapy, not on bp medication, may resume aspirin daily until we can confirm if she had a stroke.   Dyslipidemia: continue pravastatin and check levels.   GERD: taking medication and under control if she avoids spicy food   Patient Active Problem List   Diagnosis Date Noted  . GERD without esophagitis 04/11/2018  . Hyperlipidemia 04/11/2018  . Hypertension 04/11/2018  . Hypothyroidism 04/26/2015  . Bipolar I disorder, most recent episode (or current) manic ([place]) 04/25/2015  . Delirium due to another medical condition 04/25/2015  . Cannabis abuse 04/25/2015    Past Surgical History:  Procedure Laterality Date  . CESAREAN SECTION    . MOUTH SURGERY      Family History  Problem Relation Age of Onset  . Depression Mother   . Anxiety disorder Mother   . Diabetes Mother   . Hypertension Mother   . Hyperlipidemia Mother   . Cancer Mother   . Cancer Father        colon cancer  . Depression Brother   . Anxiety disorder Brother     Social History   Socioeconomic History  . Marital status: Married    Spouse name: Montine Circle  . Number of children: 1  . Years of education:  12  . Highest education level: High school graduate  Occupational History  . Occupation: unemployed    Comment: disabled  Social Needs  . Financial resource strain: Not very hard  . Food insecurity:    Worry: Often true    Inability: Often true  . Transportation needs:    Medical: Yes    Non-medical: Yes  Tobacco Use  . Smoking status: Current Every Day Smoker    Packs/day: 1.50    Types: Cigarettes  . Smokeless tobacco: Never Used  Substance and Sexual Activity  . Alcohol use: Not Currently    Alcohol/week: 7.2 oz    Types: 12 Cans of beer per week  . Drug use: Not Currently    Types: Marijuana  . Sexual activity: Not Currently    Birth control/protection: Post-menopausal  Lifestyle  . Physical activity:    Days per week: 0 days    Minutes per session: 0 min  . Stress: To some  extent  Relationships  . Social connections:    Talks on phone: Three times a week    Gets together: Once a week    Attends religious service: Never    Active member of club or organization: No    Attends meetings of clubs or organizations: Never    Relationship status: Married  . Intimate partner violence:    Fear of current or ex partner: No    Emotionally abused: No    Physically abused: No    Forced sexual activity: No  Other Topics Concern  . Not on file  Social History Narrative  . Not on file     Current Outpatient Medications:  .  amantadine (SYMMETREL) 100 MG capsule, , Disp: , Rfl:  .  buPROPion (WELLBUTRIN XL) 150 MG 24 hr tablet, , Disp: , Rfl:  .  busPIRone (BUSPAR) 10 MG tablet, Take 10 mg by mouth 2 (two) times daily., Disp: , Rfl:  .  Docusate Sodium (DULCOLAX STOOL SOFTENER PO), Take by mouth as needed., Disp: , Rfl:  .  gabapentin (NEURONTIN) 300 MG capsule, Take 300 mg by mouth 2 (two) times daily., Disp: , Rfl:  .  ibuprofen (ADVIL,MOTRIN) 600 MG tablet, Take 1 tablet (600 mg total) by mouth every 8 (eight) hours as needed., Disp: 30 tablet, Rfl: 0 .  INVEGA TRINZA 819 MG/2.625ML SUSY, , Disp: , Rfl:  .  lactulose (CHRONULAC) 10 GM/15ML solution, Take 15 mLs (10 g total) by mouth at bedtime., Disp: 240 mL, Rfl: 0 .  levothyroxine (SYNTHROID, LEVOTHROID) 75 MCG tablet, Take 1 tablet (75 mcg total) by mouth daily at 6 (six) AM., Disp: 30 tablet, Rfl: 0 .  paliperidone (INVEGA SUSTENNA) 156 MG/ML SUSP injection, Inject 1 mL (156 mg total) into the muscle once., Disp: 0.9 mL, Rfl: 0 .  paliperidone (INVEGA) 9 MG 24 hr tablet, Take 1 tablet (9 mg total) by mouth at bedtime., Disp: 30 tablet, Rfl: 0 .  pantoprazole (PROTONIX) 40 MG tablet, Take 1 tablet (40 mg total) by mouth daily at 6 (six) AM., Disp: 30 tablet, Rfl: 0 .  pravastatin (PRAVACHOL) 20 MG tablet, Take by mouth., Disp: , Rfl:  .  prazosin (MINIPRESS) 2 MG capsule, Take 2 mg by mouth every morning., Disp:  , Rfl:  .  sertraline (ZOLOFT) 100 MG tablet, Take 100 mg by mouth 2 (two) times daily., Disp: , Rfl:  .  traZODone (DESYREL) 100 MG tablet, Take 1 tablet (100 mg total) by mouth at bedtime as needed  for sleep., Disp: 39 tablet, Rfl: 0 .  vitamin B-12 (CYANOCOBALAMIN) 1000 MCG tablet, Take by mouth., Disp: , Rfl:  .  albuterol (PROVENTIL HFA;VENTOLIN HFA) 108 (90 Base) MCG/ACT inhaler, Inhale 2 puffs into the lungs every 6 (six) hours as needed for wheezing or shortness of breath., Disp: 1 Inhaler, Rfl: 0 .  ciprofloxacin (CIPRO) 250 MG tablet, Take 1 tablet (250 mg total) by mouth 2 (two) times daily., Disp: 10 tablet, Rfl: 0 .  hydrochlorothiazide (HYDRODIURIL) 12.5 MG tablet, Take 1 tablet (12.5 mg total) by mouth daily., Disp: 30 tablet, Rfl: 0 .  ofloxacin (FLOXIN) 0.3 % OTIC solution, Place 5 drops into the left ear daily., Disp: 5 mL, Rfl: 0 .  umeclidinium-vilanterol (ANORO ELLIPTA) 62.5-25 MCG/INH AEPB, Inhale 1 puff into the lungs daily., Disp: 60 each, Rfl: 2  Current Facility-Administered Medications:  .  albuterol (PROVENTIL) (2.5 MG/3ML) 0.083% nebulizer solution 2.5 mg, 2.5 mg, Nebulization, Once, Steele Sizer, MD  Allergies  Allergen Reactions  . Penicillins Other (See Comments)    GI distress  . Perphenazine Other (See Comments)    Tremors, muscle weakness  . Sulfa Antibiotics Other (See Comments)    GI distress   . Abilify [Aripiprazole] Rash     ROS  Constitutional: Negative for fever or weight change.  Respiratory: Positive  for cough and shortness of breath.   Cardiovascular: Negative for chest pain or palpitations.  Gastrointestinal: Positive  for abdominal pain, no bowel changes.  Musculoskeletal: Negative for gait problem or joint swelling.  Skin: Negative for rash.  Neurological: Negative for dizziness or headache.  No other specific complaints in a complete review of systems (except as listed in HPI above).  Objective  Vitals:   04/11/18 0959  BP:  (!) 150/90  Pulse: 93  Resp: 16  SpO2: 96%  Weight: 159 lb 1.6 oz (72.2 kg)  Height: 4' 10.66" (1.49 m)    Body mass index is 32.51 kg/m.  Physical Exam  Constitutional: Patient appears well-developed and well-nourished. Obese No distress.  HEENT: head atraumatic, normocephalic, pupils equal and reactive to light, ears : right ear canal has cerumen, left ear canal is read, inflamed, with white debrisneck supple, throat within normal limits Cardiovascular: Normal rate, regular rhythm and normal heart sounds.  No murmur heard. No BLE edema. Pulmonary/Chest: Effort normal , diffuse bilateral rhonchi, improved a little with albuterol  No respiratory distress. Abdominal: Soft.  There is left lower abdominal mild  Tenderness, negative CVA tenderness. Psychiatric: Patient has a normal mood and affect. behavior is normal. Judgment and thought content normal.  Recent Results (from the past 2160 hour(s))  Urinalysis, Complete w Microscopic     Status: Abnormal   Collection Time: 01/24/18  9:37 AM  Result Value Ref Range   Color, Urine YELLOW (A) YELLOW   APPearance HAZY (A) CLEAR   Specific Gravity, Urine 1.013 1.005 - 1.030   pH 6.0 5.0 - 8.0   Glucose, UA NEGATIVE NEGATIVE mg/dL   Hgb urine dipstick MODERATE (A) NEGATIVE   Bilirubin Urine NEGATIVE NEGATIVE   Ketones, ur NEGATIVE NEGATIVE mg/dL   Protein, ur 30 (A) NEGATIVE mg/dL   Nitrite NEGATIVE NEGATIVE   Leukocytes, UA LARGE (A) NEGATIVE   RBC / HPF TOO NUMEROUS TO COUNT 0 - 5 RBC/hpf   WBC, UA TOO NUMEROUS TO COUNT 0 - 5 WBC/hpf   Bacteria, UA NONE SEEN NONE SEEN   Squamous Epithelial / LPF 0-5 (A) NONE SEEN    Comment: Performed  at [facility], [place], [place] [address]  POCT Urinalysis Dipstick     Status: Abnormal   Collection Time: 04/11/18 10:42 AM  Result Value Ref Range   Color, UA dark orange    Clarity, UA clear    Glucose, UA neg    Bilirubin, UA neg    Ketones, UA neg    Spec Grav,  UA 1.025 1.010 - 1.025   Blood, UA +++    pH, UA 5.0 5.0 - 8.0   Protein, UA +    Urobilinogen, UA 0.2 0.2 or 1.0 E.U./dL   Nitrite, UA positive    Leukocytes, UA Moderate (2+) (A) Negative   Appearance dark    Odor yes       PHQ2/9: Depression screen PHQ 2/9 04/11/2018  Decreased Interest 1  Down, Depressed, Hopeless 0  PHQ - 2 Score 1     Fall Risk: Fall Risk  04/11/2018  Falls in the past year? No    Functional Status Survey: Is the patient deaf or have difficulty hearing?: Yes Does the patient have difficulty seeing, even when wearing glasses/contacts?: No Does the patient have difficulty concentrating, remembering, or making decisions?: Yes Does the patient have difficulty walking or climbing stairs?: Yes Does the patient have difficulty dressing or bathing?: No Does the patient have difficulty doing errands alone such as visiting a doctor's office or shopping?: No   Assessment & Plan  1. Mucopurulent chronic bronchitis (HCC)  - albuterol (PROVENTIL) (2.5 MG/3ML) 0.083% nebulizer solution 2.5 mg - Pneumococcal polysaccharide vaccine 23-valent greater than or equal to 2yo subcutaneous/IM - umeclidinium-vilanterol (ANORO ELLIPTA) 62.5-25 MCG/INH AEPB; Inhale 1 puff into the lungs daily.  Dispense: 60 each; Refill: 2 - albuterol (PROVENTIL HFA;VENTOLIN HFA) 108 (90 Base) MCG/ACT inhaler; Inhale 2 puffs into the lungs every 6 (six) hours as needed for wheezing or shortness of breath.  Dispense: 1 Inhaler; Refill: 0  2. Encounter for screening for HIV  - HIV antibody - ciprofloxacin (CIPRO) 250 MG tablet; Take 1 tablet (250 mg total) by mouth 2 (two) times daily.  Dispense: 10 tablet; Refill: 0  3. Recurrent UTI  - POCT Urinalysis Dipstick - ciprofloxacin (CIPRO) 250 MG tablet; Take 1 tablet (250 mg total) by mouth 2 (two) times daily.  Dispense: 10 tablet; Refill: 0 - CULTURE, URINE COMPREHENSIVE  4. Left flank pain  - CULTURE, URINE COMPREHENSIVE  5.  Essential hypertension  - COMPLETE METABOLIC PANEL WITH GFR - CBC with Differential/Platelet - hydrochlorothiazide (HYDRODIURIL) 12.5 MG tablet; Take 1 tablet (12.5 mg total) by mouth daily.  Dispense: 30 tablet; Refill: 0  6. Hypothyroidism due to medication  - TSH  7. Mixed hyperlipidemia  - Lipid panel - pravastatin (PRAVACHOL) 20 MG tablet; Take 1 tablet (20 mg total) by mouth daily.  Dispense: 30 tablet; Refill: 0  8. Diabetes mellitus screening  - Hemoglobin A1c  9. Breast cancer screening  - MM 3D SCREEN BREAST BILATERAL; Future  10. History of kidney stones  - Ambulatory referral to Urology  11. Other infective acute otitis externa of left ear  - ofloxacin (FLOXIN) 0.3 % OTIC solution; Place 5 drops into the left ear daily.  Dispense: 5 mL; Refill: 0  12. GERD without esophagitis  - pantoprazole (PROTONIX) 40 MG tablet; Take 1 tablet (40 mg total) by mouth daily at 6 (six) AM.  Dispense: 30 tablet; Refill: 5  13. Left-sided muscle weakness  - CT Head Wo Contrast; Future

## 2018-04-11 NOTE — Telephone Encounter (Signed)
Copied from [place] [id number]. Topic: Quick Communication - Rx Refill/Question >> Apr 11, 2018  1:10 PM Margot Ables wrote: Medication: Floxin - the pharmacy can have the generic in stock to dispense tomorrow or they have the ophthalmic/eye drop in stock in the same stregth that is in stock today and can be used to treat the ear - please advise  Preferred Pharmacy (with phone number or street name): [facility] [address] [place], [place] - [place] [address] Phone: [phone] Fax: [phone]

## 2018-04-12 ENCOUNTER — Other Ambulatory Visit: Payer: Self-pay | Admitting: Family Medicine

## 2018-04-12 MED ORDER — ROSUVASTATIN CALCIUM 20 MG PO TABS: 20.0000 mg | ORAL_TABLET | Freq: Every day | ORAL | 1 refills | Status: DC

## 2018-04-13 LAB — COMPLETE METABOLIC PANEL WITH GFR
AG Ratio: 1.6 (calc) (ref 1.0–2.5)
ALT: 11 U/L (ref 6–29)
AST: 12 U/L (ref 10–35)
Albumin: 4.2 g/dL (ref 3.6–5.1)
Alkaline phosphatase (APISO): 93 U/L (ref 33–115)
BUN/Creatinine Ratio: 12 (calc) (ref 6–22)
BUN: 15 mg/dL (ref 7–25)
CALCIUM: 9.2 mg/dL (ref 8.6–10.2)
CO2: 26 mmol/L (ref 20–32)
CREATININE: 1.21 mg/dL — AB (ref 0.50–1.10)
Chloride: 106 mmol/L (ref 98–110)
GFR, EST AFRICAN AMERICAN: 63 mL/min/{1.73_m2} (ref >=60)
GFR, EST NON AFRICAN AMERICAN: 54 mL/min/{1.73_m2} — AB (ref >=60)
GLOBULIN: 2.7 g/dL (ref 1.9–3.7)
Glucose, Bld: 93 mg/dL (ref 65–99)
Potassium: 4.4 mmol/L (ref 3.5–5.3)
Sodium: 139 mmol/L (ref 135–146)
TOTAL PROTEIN: 6.9 g/dL (ref 6.1–8.1)
Total Bilirubin: 0.3 mg/dL (ref 0.2–1.2)

## 2018-04-13 LAB — CBC WITH DIFFERENTIAL/PLATELET
Basophils Absolute: 62 cells/uL (ref 0–200)
Basophils Relative: 0.7 %
Eosinophils Absolute: 169 cells/uL (ref 15–500)
Eosinophils Relative: 1.9 %
HEMATOCRIT: 39.9 % (ref 35.0–45.0)
Hemoglobin: 13.8 g/dL (ref 11.7–15.5)
LYMPHS ABS: 1860 {cells}/uL (ref 850–3900)
MCH: 31.2 pg (ref 27.0–33.0)
MCHC: 34.6 g/dL (ref 32.0–36.0)
MCV: 90.3 fL (ref 80.0–100.0)
MPV: 10 fL (ref 7.5–12.5)
Monocytes Relative: 6.7 %
NEUTROS PCT: 69.8 %
Neutro Abs: 6212 cells/uL (ref 1500–7800)
Platelets: 179 10*3/uL (ref 140–400)
RBC: 4.42 10*6/uL (ref 3.80–5.10)
RDW: 14.4 % (ref 11.0–15.0)
Total Lymphocyte: 20.9 %
WBC: 8.9 10*3/uL (ref 3.8–10.8)
WBCMIX: 596 {cells}/uL (ref 200–950)

## 2018-04-13 LAB — CULTURE, URINE COMPREHENSIVE
MICRO NUMBER:: 90479502
SPECIMEN QUALITY:: ADEQUATE

## 2018-04-13 LAB — LIPID PANEL
Cholesterol: 217 mg/dL — ABNORMAL HIGH (ref <200)
HDL: 39 mg/dL — AB (ref >50)
LDL Cholesterol (Calc): 141 mg/dL (calc) — ABNORMAL HIGH
Non-HDL Cholesterol (Calc): 178 mg/dL (calc) — ABNORMAL HIGH (ref <130)
TRIGLYCERIDES: 220 mg/dL — AB (ref <150)
Total CHOL/HDL Ratio: 5.6 (calc) — ABNORMAL HIGH (ref <5.0)

## 2018-04-13 LAB — HIV ANTIBODY (ROUTINE TESTING W REFLEX): HIV 1&2 Ab, 4th Generation: NONREACTIVE

## 2018-04-13 LAB — TSH: TSH: 2 m[IU]/L

## 2018-04-13 LAB — HEMOGLOBIN A1C
EAG (MMOL/L): 7.6 (calc)
Hgb A1c MFr Bld: 6.4 % of total Hgb — ABNORMAL HIGH (ref <5.7)
MEAN PLASMA GLUCOSE: 137 (calc)

## 2018-04-15 NOTE — Telephone Encounter (Signed)
She should be able to wait until tomorrow

## 2018-04-15 NOTE — Telephone Encounter (Signed)
Logan from [employer] was notified generic Floxin is ok to use instead of brand. 04/15/2018 at 3:34 p.m.

## 2018-04-17 ENCOUNTER — Other Ambulatory Visit: Payer: Self-pay | Admitting: Family Medicine

## 2018-04-17 ENCOUNTER — Telehealth: Payer: Self-pay | Admitting: Family Medicine

## 2018-04-17 MED ORDER — LEVOTHYROXINE SODIUM 75 MCG PO TABS: 75.0000 ug | ORAL_TABLET | Freq: Every day | ORAL | 0 refills | Status: DC

## 2018-04-17 NOTE — Telephone Encounter (Signed)
Copied from [place] [id number]. Topic: Quick Communication - Rx Refill/Question >> Apr 17, 2018  2:47 PM Yvette Rack wrote: Medication: levothyroxine (SYNTHROID, LEVOTHROID) 75 MCG tablet Has the patient contacted their pharmacy? yes (Agent: If no, request that the patient contact the pharmacy for the refill.) Preferred Pharmacy (with phone number or street name): [place], [place] [phone] (Phone) [phone] (Fax)  Agent: Please be advised that RX refills may take up to 3 business days. We ask that you follow-up with your pharmacy.

## 2018-04-17 NOTE — Telephone Encounter (Signed)
Copied from [place] [id number]. Topic: General - Other >> Apr 17, 2018  2:49 PM Yvette Rack wrote: Reason for CRM: patient calling about lab results

## 2018-04-18 NOTE — Telephone Encounter (Signed)
Left message with pt's best friend Elta Guadeloupe to return call to the office. Number currently listed as cell phone is her best friend's number because the pt does not have any minutes on her cell phone.

## 2018-05-06 ENCOUNTER — Other Ambulatory Visit: Payer: Self-pay

## 2018-05-06 ENCOUNTER — Telehealth: Payer: Self-pay | Admitting: Family Medicine

## 2018-05-06 DIAGNOSIS — E032 Hypothyroidism due to medicaments and other exogenous substances: Secondary | ICD-10-CM

## 2018-05-06 MED ORDER — LEVOTHYROXINE SODIUM 75 MCG PO TABS: 75.0000 ug | ORAL_TABLET | Freq: Every day | ORAL | 0 refills | Status: DC

## 2018-05-06 NOTE — Telephone Encounter (Signed)
Refill request for thyroid medication: Levothyroxine 75 mcg  Last Physical: None indicated   Lab Results  Component Value Date   TSH 2.00 04/11/2018    Follow-ups on file. 05/14/2018

## 2018-05-06 NOTE — Telephone Encounter (Signed)
Copied from [place] [phone]. Topic: Quick Communication - Rx Refill/Question >> May 06, 2018 12:40 PM Lennox Solders wrote: Reason for CRM:  Medication levothyroxine 75 mcg #90 Has the patient contacted their pharmacy? yes [facility]erred [facility] (with phone number [facility] street [facility]

## 2018-05-14 ENCOUNTER — Encounter: Payer: Self-pay | Admitting: Family Medicine

## 2018-05-14 ENCOUNTER — Ambulatory Visit (INDEPENDENT_AMBULATORY_CARE_PROVIDER_SITE_OTHER): Payer: Medicare Other | Admitting: Family Medicine

## 2018-05-14 VITALS — BP 118/64 | HR 101 | Temp 98.5°F | Resp 20 | Ht 59.0 in | Wt 151.4 lb

## 2018-05-14 DIAGNOSIS — I10 Essential (primary) hypertension: Secondary | ICD-10-CM

## 2018-05-14 DIAGNOSIS — J411 Mucopurulent chronic bronchitis: Secondary | ICD-10-CM | POA: Diagnosis not present

## 2018-05-14 DIAGNOSIS — K5909 Other constipation: Secondary | ICD-10-CM | POA: Diagnosis not present

## 2018-05-14 DIAGNOSIS — N390 Urinary tract infection, site not specified: Secondary | ICD-10-CM

## 2018-05-14 DIAGNOSIS — R7303 Prediabetes: Secondary | ICD-10-CM

## 2018-05-14 DIAGNOSIS — Z23 Encounter for immunization: Secondary | ICD-10-CM

## 2018-05-14 DIAGNOSIS — F3110 Bipolar disorder, current episode manic without psychotic features, unspecified: Secondary | ICD-10-CM

## 2018-05-14 MED ORDER — LUBIPROSTONE 24 MCG PO CAPS: 24.0000 ug | ORAL_CAPSULE | Freq: Two times a day (BID) | ORAL | 2 refills | Status: DC

## 2018-05-14 NOTE — Progress Notes (Signed)
Name: April Luna   MRN: [id number]    DOB: April 01, 1971   Date:05/14/2018       Progress Note  Subjective  Chief Complaint  Chief Complaint  Patient presents with  . Follow-up    1 month F/U  . Hypertension    Has been doing a DM Diet at home and has lost 8 pounds.  Denies any symptoms  . Constipation    States she is having a bowel movement daily but still hard stools  . Hyperlipidemia    Denies any symptoms with new medication    HPI  COPD: she is now on Anoro Elipta since April 2019, she states cough not as often, but when productive and white in color. SOB has improved and usually only present in am's with coughing spells. Still smoking, and states difficulty quitting because has two other family members that also smoke in the house  HTN: last visit bp was high, she is taking medications and bp is at goal, she feels foggy minded at times, but states that also feeling more depressed and can get that way. No chest pain. Occasionally has palpitation   Hyperlipidemia: we switched from Pravachol to Crestor last month because of lab results, no side effects and we will recheck labs next visit  Recurrent UTI: states once she finished Cipro symptoms resolved for a few days and now back with dysuria, urgency, but no fever or chills.   Chronic constipation: going on since started psychiatric medications years ago, she used to take Lactulose, but ran out of medication and has to strain , stools are hard, not able to empty her bowels.  Morbid obesity: she has HTN, hyperlipidemia and also pre-diabetes: she has lost almost 8 lbs in the past month, but switching from sodas to diet drinks or sparkling water, drinking diet green tea, eating more vegetables and fruit  Pre-diabetes: she denies polyphagia, but has episodes of polydipsia and polyuria.   Bipolar disorder: she states she is doing okay, she states mostly feeling down now, she states in the past had a lot of paranoia but not now.    Patient Active Problem List   Diagnosis Date Noted  . GERD without esophagitis 04/11/2018  . Hyperlipidemia 04/11/2018  . Hypertension 04/11/2018  . Hypothyroidism 04/26/2015  . Bipolar I disorder, most recent episode (or current) manic ([place]) 04/25/2015  . Delirium due to another medical condition 04/25/2015  . Cannabis abuse 04/25/2015    Past Surgical History:  Procedure Laterality Date  . CESAREAN SECTION    . MOUTH SURGERY      Family History  Problem Relation Age of Onset  . Depression Mother   . Anxiety disorder Mother   . Diabetes Mother   . Hypertension Mother   . Hyperlipidemia Mother   . Cancer Mother   . Cancer Father        colon cancer  . Depression Brother   . Anxiety disorder Brother     Social History   Socioeconomic History  . Marital status: Married    Spouse name: Montine Circle  . Number of children: 1  . Years of education: 77  . Highest education level: High school graduate  Occupational History  . Occupation: unemployed    Comment: disabled  Social Needs  . Financial resource strain: Not very hard  . Food insecurity:    Worry: Often true    Inability: Often true  . Transportation needs:    Medical: Yes  Non-medical: Yes  Tobacco Use  . Smoking status: Current Every Day Smoker    Packs/day: 1.50    Years: 30.00    Pack years: 45.00    Types: Cigarettes    Start date: 05/13/1985  . Smokeless tobacco: Never Used  Substance and Sexual Activity  . Alcohol use: Not Currently    Alcohol/week: 7.2 oz    Types: 12 Cans of beer per week  . Drug use: Not Currently    Types: Marijuana  . Sexual activity: Not Currently    Birth control/protection: Post-menopausal  Lifestyle  . Physical activity:    Days per week: 0 days    Minutes per session: 0 min  . Stress: To some extent  Relationships  . Social connections:    Talks on phone: Three times a week    Gets together: Once a week    Attends religious service: Never    Active  member of club or organization: No    Attends meetings of clubs or organizations: Never    Relationship status: Married  . Intimate partner violence:    Fear of current or ex partner: No    Emotionally abused: No    Physically abused: No    Forced sexual activity: No  Other Topics Concern  . Not on file  Social History Narrative  . Not on file     Current Outpatient Medications:  .  albuterol (PROVENTIL HFA;VENTOLIN HFA) 108 (90 Base) MCG/ACT inhaler, Inhale 2 puffs into the lungs every 6 (six) hours as needed for wheezing or shortness of breath., Disp: 1 Inhaler, Rfl: 0 .  amantadine (SYMMETREL) 100 MG capsule, , Disp: , Rfl:  .  buPROPion (WELLBUTRIN XL) 150 MG 24 hr tablet, , Disp: , Rfl:  .  busPIRone (BUSPAR) 10 MG tablet, Take 10 mg by mouth 2 (two) times daily., Disp: , Rfl:  .  Docusate Sodium (DULCOLAX STOOL SOFTENER PO), Take by mouth as needed., Disp: , Rfl:  .  gabapentin (NEURONTIN) 300 MG capsule, Take 300 mg by mouth 2 (two) times daily., Disp: , Rfl:  .  hydrochlorothiazide (HYDRODIURIL) 12.5 MG tablet, Take 1 tablet (12.5 mg total) by mouth daily., Disp: 30 tablet, Rfl: 0 .  ibuprofen (ADVIL,MOTRIN) 600 MG tablet, Take 1 tablet (600 mg total) by mouth every 8 (eight) hours as needed., Disp: 30 tablet, Rfl: 0 .  lactulose (CHRONULAC) 10 GM/15ML solution, Take 15 mLs (10 g total) by mouth at bedtime., Disp: 240 mL, Rfl: 0 .  levothyroxine (SYNTHROID, LEVOTHROID) 75 MCG tablet, Take 1 tablet (75 mcg total) by mouth daily at 6 (six) AM., Disp: 30 tablet, Rfl: 0 .  ofloxacin (FLOXIN) 0.3 % OTIC solution, Place 5 drops into the left ear daily., Disp: 5 mL, Rfl: 0 .  paliperidone (INVEGA SUSTENNA) 156 MG/ML SUSP injection, Inject 1 mL (156 mg total) into the muscle once., Disp: 0.9 mL, Rfl: 0 .  paliperidone (INVEGA) 9 MG 24 hr tablet, Take 1 tablet (9 mg total) by mouth at bedtime., Disp: 30 tablet, Rfl: 0 .  pantoprazole (PROTONIX) 40 MG tablet, Take 1 tablet (40 mg total) by  mouth daily at 6 (six) AM., Disp: 30 tablet, Rfl: 5 .  prazosin (MINIPRESS) 2 MG capsule, Take 2 mg by mouth every morning., Disp: , Rfl:  .  rosuvastatin (CRESTOR) 20 MG tablet, Take 1 tablet (20 mg total) by mouth daily. In place of pravastatin, Disp: 90 tablet, Rfl: 1 .  sertraline (ZOLOFT) 100 MG tablet,  Take 100 mg by mouth 2 (two) times daily., Disp: , Rfl:  .  traZODone (DESYREL) 100 MG tablet, Take 1 tablet (100 mg total) by mouth at bedtime as needed for sleep., Disp: 39 tablet, Rfl: 0 .  umeclidinium-vilanterol (ANORO ELLIPTA) 62.5-25 MCG/INH AEPB, Inhale 1 puff into the lungs daily., Disp: 60 each, Rfl: 2 .  vitamin B-12 (CYANOCOBALAMIN) 1000 MCG tablet, Take by mouth., Disp: , Rfl:  .  lubiprostone (AMITIZA) 24 MCG capsule, Take 1 capsule (24 mcg total) by mouth 2 (two) times daily with a meal., Disp: 60 capsule, Rfl: 2  Allergies  Allergen Reactions  . Penicillins Other (See Comments)    GI distress  . Perphenazine Other (See Comments)    Tremors, muscle weakness  . Sulfa Antibiotics Other (See Comments)    GI distress   . Abilify [Aripiprazole] Rash     ROS  Constitutional: Negative for fever, positive for  weight change.  Respiratory: Positive  for cough and occasional  shortness of breath.   Cardiovascular: Negative for chest pain or palpitations.  Gastrointestinal: Negative for abdominal pain, no bowel changes - but tired of constipation .  Musculoskeletal: Negative for gait problem or joint swelling.  Skin: Negative for rash.  Neurological: Negative for dizziness or headache.  No other specific complaints in a complete review of systems (except as listed in HPI above). Objective  Vitals:   05/14/18 1400  BP: 118/64  Pulse: (!) 101  Resp: 20  Temp: 98.5 F (36.9 C)  TempSrc: Oral  SpO2: 93%  Weight: 151 lb 6.4 oz (68.7 kg)  Height: 4\' 11"  (1.499 m)    Body mass index is 30.58 kg/m.  Physical Exam  Constitutional: Patient appears well-developed and  well-nourished. Obese No distress.  HEENT: head atraumatic, normocephalic, pupils equal and reactive to light,  neck supple, throat within normal limits Cardiovascular: Normal rate, regular rhythm and normal heart sounds.  No murmur heard. No BLE edema. Pulmonary/Chest: Effort normal and breath sounds with scattered rhonchi, no wheezing l. No respiratory distress. Abdominal: Soft.  There is no tenderness. Negative CVA tenderness  Psychiatric: Patient has a normal mood and affect. behavior is normal. Judgment and thought content normal.  Recent Results (from the past 2160 hour(s))  POCT Urinalysis Dipstick     Status: Abnormal   Collection Time: 04/11/18 10:42 AM  Result Value Ref Range   Color, UA dark orange    Clarity, UA clear    Glucose, UA neg    Bilirubin, UA neg    Ketones, UA neg    Spec Grav, UA 1.025 1.010 - 1.025   Blood, UA +++    pH, UA 5.0 5.0 - 8.0   Protein, UA +    Urobilinogen, UA 0.2 0.2 or 1.0 E.U./dL   Nitrite, UA positive    Leukocytes, UA Moderate (2+) (A) Negative   Appearance dark    Odor yes   HIV antibody     Status: None   Collection Time: 04/11/18 12:03 PM  Result Value Ref Range   HIV 1&2 Ab, 4th Generation NON-REACTIVE NON-REACTI    Comment: HIV-1 antigen and HIV-1/HIV-2 antibodies were not detected. There is no laboratory evidence of HIV infection. Marland Kitchen PLEASE NOTE: This information has been disclosed to you from records whose confidentiality may be protected by state law.  If your state requires such protection, then the state law prohibits you from making any further disclosure of the information without the specific written consent of the person  to whom it pertains, or as otherwise permitted by law. A general authorization for the release of medical or other information is NOT sufficient for this purpose. . For additional information please refer to http://education.[facility].com/faq/FAQ106 (This link is being provided for  informational/ educational purposes only.) . Marland Kitchen The performance of this assay has not been clinically validated in patients less than 56 years old. .   TSH     Status: None   Collection Time: 04/11/18 12:03 PM  Result Value Ref Range   TSH 2.00 mIU/L    Comment:           Reference Range .           > or = 20 Years  0.40-4.50 .                Pregnancy Ranges           First trimester    0.26-2.66           Second trimester   0.55-2.73           Third trimester    0.43-2.91   COMPLETE METABOLIC PANEL WITH GFR     Status: Abnormal   Collection Time: 04/11/18 12:03 PM  Result Value Ref Range   Glucose, Bld 93 65 - 99 mg/dL    Comment: .            Fasting reference interval .    BUN 15 7 - 25 mg/dL   Creat 1.21 (H) 0.50 - 1.10 mg/dL   GFR, Est Non African American 54 (L) > OR = 60 mL/min/1.52m2   GFR, Est African American 63 > OR = 60 mL/min/1.61m2   BUN/Creatinine Ratio 12 6 - 22 (calc)   Sodium 139 135 - 146 mmol/L   Potassium 4.4 3.5 - 5.3 mmol/L   Chloride 106 98 - 110 mmol/L   CO2 26 20 - 32 mmol/L   Calcium 9.2 8.6 - 10.2 mg/dL   Total Protein 6.9 6.1 - 8.1 g/dL   Albumin 4.2 3.6 - 5.1 g/dL   Globulin 2.7 1.9 - 3.7 g/dL (calc)   AG Ratio 1.6 1.0 - 2.5 (calc)   Total Bilirubin 0.3 0.2 - 1.2 mg/dL   Alkaline phosphatase (APISO) 93 33 - 115 U/L   AST 12 10 - 35 U/L   ALT 11 6 - 29 U/L  CBC with Differential/Platelet     Status: None   Collection Time: 04/11/18 12:03 PM  Result Value Ref Range   WBC 8.9 3.8 - 10.8 Thousand/uL   RBC 4.42 3.80 - 5.10 Million/uL   Hemoglobin 13.8 11.7 - 15.5 g/dL   HCT 39.9 35.0 - 45.0 %   MCV 90.3 80.0 - 100.0 fL   MCH 31.2 27.0 - 33.0 pg   MCHC 34.6 32.0 - 36.0 g/dL   RDW 14.4 11.0 - 15.0 %   Platelets 179 140 - 400 Thousand/uL   MPV 10.0 7.5 - 12.5 fL   Neutro Abs 6,212 1,500 - 7,800 cells/uL   Lymphs Abs 1,860 850 - 3,900 cells/uL   WBC mixed population 596 200 - 950 cells/uL   Eosinophils Absolute 169 15 - 500 cells/uL    Basophils Absolute 62 0 - 200 cells/uL   Neutrophils Relative % 69.8 %   Total Lymphocyte 20.9 %   Monocytes Relative 6.7 %   Eosinophils Relative 1.9 %   Basophils Relative 0.7 %  Hemoglobin A1c     Status: Abnormal   Collection  Time: 04/11/18 12:03 PM  Result Value Ref Range   Hgb A1c MFr Bld 6.4 (H) <5.7 % of total Hgb    Comment: For someone without known diabetes, a hemoglobin  A1c value between 5.7% and 6.4% is consistent with prediabetes and should be confirmed with a  follow-up test. . For someone with known diabetes, a value <7% indicates that their diabetes is well controlled. A1c targets should be individualized based on duration of diabetes, age, comorbid conditions, and other considerations. . This assay result is consistent with an increased risk of diabetes. . Currently, no consensus exists regarding use of hemoglobin A1c for diagnosis of diabetes for children. .    Mean Plasma Glucose 137 (calc)   eAG (mmol/L) 7.6 (calc)  Lipid panel     Status: Abnormal   Collection Time: 04/11/18 12:03 PM  Result Value Ref Range   Cholesterol 217 (H) <200 mg/dL   HDL 39 (L) >50 mg/dL   Triglycerides 220 (H) <150 mg/dL   LDL Cholesterol (Calc) 141 (H) mg/dL (calc)    Comment: Reference range: <100 . Desirable range <100 mg/dL for primary prevention;   <70 mg/dL for patients with CHD or diabetic patients  with > or = 2 CHD risk factors. Marland Kitchen LDL-C is now calculated using the Martin-Hopkins  calculation, which is a validated novel method providing  better accuracy than the Friedewald equation in the  estimation of LDL-C.  Cresenciano Genre et al. Annamaria Helling. 3500;938(18): 2061-2068  (http://education.[facility].com/faq/FAQ164)    Total CHOL/HDL Ratio 5.6 (H) <5.0 (calc)   Non-HDL Cholesterol (Calc) 178 (H) <130 mg/dL (calc)    Comment: For patients with diabetes plus 1 major ASCVD risk  factor, treating to a non-HDL-C goal of <100 mg/dL  (LDL-C of <70 mg/dL) is considered  a therapeutic  option.   CULTURE, URINE COMPREHENSIVE     Status: Abnormal   Collection Time: 04/11/18 12:03 PM  Result Value Ref Range   MICRO NUMBER: [id number]    SPECIMEN QUALITY: ADEQUATE    Source OTHER (SPECIFY)    STATUS: FINAL    ISOLATE 1: Escherichia coli (A)     Comment: Greater than 100,000 CFU/mL of Escherichia coli      Susceptibility   Escherichia coli - CULT, URN, SPECIAL NEGATIVE 1    AMOX/CLAVULANIC 4 Sensitive     AMPICILLIN <=2 Sensitive     AMPICILLIN/SULBACTAM <=2 Sensitive     CEFAZOLIN* <=4 Not Reportable      * For infections other than uncomplicated UTIcaused by E. coli, K. pneumoniae or P. mirabilis:Cefazolin is resistant if MIC > or = 8 mcg/mL.(Distinguishing susceptible versus intermediatefor isolates with MIC < or = 4 mcg/mL requiresadditional testing.)For uncomplicated UTI caused by E. coli,K. pneumoniae or P. mirabilis: Cefazolin issusceptible if MIC <32 mcg/mL and predictssusceptible to the oral agents cefaclor, cefdinir,cefpodoxime, cefprozil, cefuroxime, cephalexinand loracarbef.    CEFEPIME <=1 Sensitive     CEFTRIAXONE <=1 Sensitive     CIPROFLOXACIN <=0.25 Sensitive     LEVOFLOXACIN 0.25 Sensitive     ERTAPENEM <=0.5 Sensitive     GENTAMICIN <=1 Sensitive     IMIPENEM <=0.25 Sensitive     NITROFURANTOIN 64 Intermediate     PIP/TAZO <=4 Sensitive     TOBRAMYCIN <=1 Sensitive     TRIMETH/SULFA* <=20 Sensitive      * For infections other than uncomplicated UTIcaused by E. coli, K. pneumoniae or P. mirabilis:Cefazolin is resistant if MIC > or = 8 mcg/mL.(Distinguishing susceptible versus intermediatefor isolates with MIC <  or = 4 mcg/mL requiresadditional testing.)For uncomplicated UTI caused by E. coli,K. pneumoniae or P. mirabilis: Cefazolin issusceptible if MIC <32 mcg/mL and predictssusceptible to the oral agents cefaclor, cefdinir,cefpodoxime, cefprozil, cefuroxime, cephalexinand loracarbef.Legend:S = Susceptible  I = IntermediateR = Resistant  NS =  Not susceptible* = Not tested  NR = Not reported**NN = See antimicrobic comments     PHQ2/9: Depression screen [facility] 2/9 05/14/2018 04/11/2018  Decreased Interest 1 1  Down, Depressed, Hopeless 0 0  PHQ - 2 Score 1 1  Altered sleeping 3 -  Tired, decreased energy 3 -  Change in appetite 1 -  Feeling bad or failure about yourself  1 -  Trouble concentrating 2 -  Moving slowly or fidgety/restless 1 -  Suicidal thoughts 0 -  PHQ-9 Score 12 -  Difficult doing work/chores Somewhat difficult -   Sees psychiatrist and therapist, and advised her to contact them   Fall Risk: Fall Risk  05/14/2018 04/11/2018  Falls in the past year? No No     Functional Status Survey: Is the patient deaf or have difficulty hearing?: No Does the patient have difficulty seeing, even when wearing glasses/contacts?: No Does the patient have difficulty concentrating, remembering, or making decisions?: No(sometimes) Does the patient have difficulty walking or climbing stairs?: No Does the patient have difficulty dressing or bathing?: No Does the patient have difficulty doing errands alone such as visiting a doctor's office or shopping?: No(just needs help with transportation because she does not have a car)    Assessment & Plan  1. Bipolar I disorder, most recent episode (or current) manic [facility])  She sees psychiatrist, last episode that caused admission was manic   2. Mucopurulent chronic bronchitis ([place])  She is breathing better with Anoro  3. Recurrent UTI  If still positive we will refer her to Urologist  - CULTURE, URINE COMPREHENSIVE  4. Essential hypertension  At goal   5. Pre-diabetes  She has changed her diet and has lost weight, we will recheck labs in 3 months   6. Morbid obesity ([place])  Based on BMI above 30 and risk factors such as hyperlipidemia, pre-diabetes and HTN  Discussed with the patient the risk posed by an increased BMI. Discussed importance of portion control, calorie  counting and at least 150 minutes of physical activity weekly. Avoid sweet beverages and drink more water. Eat at least 6 servings of fruit and vegetables daily   7. Chronic constipation  - lubiprostone (AMITIZA) 24 MCG capsule; Take 1 capsule (24 mcg total) by mouth 2 (two) times daily with a meal.  Dispense: 60 capsule; Refill: 2

## 2018-05-16 LAB — CULTURE, URINE COMPREHENSIVE
MICRO NUMBER:: 90618493
SPECIMEN QUALITY:: ADEQUATE

## 2018-05-19 ENCOUNTER — Other Ambulatory Visit: Payer: Self-pay | Admitting: Family Medicine

## 2018-05-19 DIAGNOSIS — J411 Mucopurulent chronic bronchitis: Secondary | ICD-10-CM

## 2018-05-19 DIAGNOSIS — N390 Urinary tract infection, site not specified: Secondary | ICD-10-CM

## 2018-05-19 MED ORDER — UMECLIDINIUM-VILANTEROL 62.5-25 MCG/INH IN AEPB: 1.0000 | INHALATION_SPRAY | Freq: Every day | RESPIRATORY_TRACT | 2 refills | Status: DC

## 2018-05-19 MED ORDER — NITROFURANTOIN MONOHYD MACRO 100 MG PO CAPS: 100.0000 mg | ORAL_CAPSULE | Freq: Two times a day (BID) | ORAL | 0 refills | Status: DC

## 2018-05-28 ENCOUNTER — Other Ambulatory Visit: Payer: Self-pay

## 2018-05-28 DIAGNOSIS — I10 Essential (primary) hypertension: Secondary | ICD-10-CM

## 2018-05-28 MED ORDER — HYDROCHLOROTHIAZIDE 12.5 MG PO TABS: 12.5000 mg | ORAL_TABLET | Freq: Every day | ORAL | 2 refills | Status: DC

## 2018-05-28 NOTE — Telephone Encounter (Signed)
Hypertension medication request: HCTZ to [place] office visit pertaining to hypertension:  05/14/2018   BP Readings from Last 3 Encounters:  05/14/18 118/64  04/11/18 (!) 150/90  01/24/18 (!) 147/81    Lab Results  Component Value Date   CREATININE 1.21 (H) 04/11/2018   BUN 15 04/11/2018   NA 139 04/11/2018   K 4.4 04/11/2018   CL 106 04/11/2018   CO2 26 04/11/2018    Follow up on 09/02/2018

## 2018-05-30 ENCOUNTER — Ambulatory Visit: Payer: Medicare Other | Attending: Family Medicine

## 2018-06-05 ENCOUNTER — Encounter: Payer: Self-pay | Admitting: Family Medicine

## 2018-06-05 ENCOUNTER — Other Ambulatory Visit: Payer: Self-pay

## 2018-06-05 DIAGNOSIS — E032 Hypothyroidism due to medicaments and other exogenous substances: Secondary | ICD-10-CM

## 2018-06-05 DIAGNOSIS — R7303 Prediabetes: Secondary | ICD-10-CM | POA: Insufficient documentation

## 2018-06-05 NOTE — Telephone Encounter (Signed)
Refill request for thyroid medication: Levothyroxine 75 mcg  Last Physical: None indicated  Lab Results  Component Value Date   TSH 2.00 04/11/2018    Follow up visit: 09/02/2018

## 2018-06-06 MED ORDER — LEVOTHYROXINE SODIUM 75 MCG PO TABS: 75.0000 ug | ORAL_TABLET | Freq: Every day | ORAL | 1 refills | Status: DC

## 2018-06-14 ENCOUNTER — Emergency Department
Admission: EM | Admit: 2018-06-14 | Discharge: 2018-06-14 | Disposition: A | Discharge status: Home / Self Care | Payer: Medicare Other | Attending: Emergency Medicine | Admitting: Emergency Medicine

## 2018-06-14 ENCOUNTER — Emergency Department: Payer: Medicare Other

## 2018-06-14 ENCOUNTER — Ambulatory Visit: Payer: Self-pay | Admitting: *Deleted

## 2018-06-14 ENCOUNTER — Encounter: Payer: Self-pay | Admitting: Emergency Medicine

## 2018-06-14 DIAGNOSIS — I10 Essential (primary) hypertension: Secondary | ICD-10-CM | POA: Insufficient documentation

## 2018-06-14 DIAGNOSIS — R0789 Other chest pain: Secondary | ICD-10-CM | POA: Diagnosis not present

## 2018-06-14 DIAGNOSIS — F17210 Nicotine dependence, cigarettes, uncomplicated: Secondary | ICD-10-CM | POA: Insufficient documentation

## 2018-06-14 DIAGNOSIS — R079 Chest pain, unspecified: Secondary | ICD-10-CM | POA: Diagnosis not present

## 2018-06-14 DIAGNOSIS — E039 Hypothyroidism, unspecified: Secondary | ICD-10-CM | POA: Insufficient documentation

## 2018-06-14 LAB — BASIC METABOLIC PANEL
Anion gap: 10 (ref 5–15)
BUN: 16 mg/dL (ref 6–20)
CALCIUM: 9.2 mg/dL (ref 8.9–10.3)
CO2: 22 mmol/L (ref 22–32)
CREATININE: 1.03 mg/dL — AB (ref 0.44–1.00)
Chloride: 104 mmol/L (ref 101–111)
GFR calc Af Amer: >60 mL/min (ref >60)
Glucose, Bld: 76 mg/dL (ref 65–99)
Potassium: 3.8 mmol/L (ref 3.5–5.1)
SODIUM: 136 mmol/L (ref 135–145)

## 2018-06-14 LAB — CBC
HCT: 40.8 % (ref 35.0–47.0)
Hemoglobin: 14 g/dL (ref 12.0–16.0)
MCH: 31.2 pg (ref 26.0–34.0)
MCHC: 34.2 g/dL (ref 32.0–36.0)
MCV: 91.3 fL (ref 80.0–100.0)
PLATELETS: 197 10*3/uL (ref 150–440)
RBC: 4.47 MIL/uL (ref 3.80–5.20)
RDW: 14.5 % (ref 11.5–14.5)
WBC: 12.9 10*3/uL — AB (ref 3.6–11.0)

## 2018-06-14 LAB — TROPONIN I: Troponin I: <0.03 ng/mL (ref <0.03)

## 2018-06-14 MED ORDER — LORAZEPAM 1 MG PO TABS: 1.0000 mg | ORAL_TABLET | Freq: Three times a day (TID) | ORAL | 0 refills | Status: DC | PRN

## 2018-06-14 NOTE — Telephone Encounter (Signed)
Patient is calling to report she has had episodes of chest pain- 3 months/average of 3 times per month. Patient reports the episodes are getting more intense and lasting a little longer. Reason for Disposition . [1] Chest pain lasting <= 5 minutes AND [2] NO chest pain or cardiac symptoms now(Exceptions: pains lasting a few seconds)  Answer Assessment - Initial Assessment Questions 1. LOCATION: "Where does it hurt?"       Patient had pressure in heart region on the left- in rib cage 2. RADIATION: "Does the pain go anywhere else?" (e.g., into neck, jaw, arms, back)     No- patient was coughing 3. ONSET: "When did the chest pain begin?" (Minutes, hours or days)      Yesterday- sudden dull pain- she had to sit for a while 4. PATTERN "Does the pain come and go, or has it been constant since it started?"  "Does it get worse with exertion?"      On/off 5. DURATION: "How long does it last" (e.g., seconds, minutes, hours)     15 minutes- patient had been drinking soda- burping helped 6. SEVERITY: "How bad is the pain?"  (e.g., Scale 1-10; mild, moderate, or severe)    - MILD (1-3): doesn't interfere with normal activities     - MODERATE (4-7): interferes with normal activities or awakens from sleep    - SEVERE (8-10): excruciating pain, unable to do any normal activities       Severe when it was occurring  7. CARDIAC RISK FACTORS: "Do you have any history of heart problems or risk factors for heart disease?" (e.g., prior heart attack, angina; high blood pressure, diabetes, being overweight, high cholesterol, smoking, or strong family history of heart disease)     Diabetic, high cholesterol, high BP, family hx 8. PULMONARY RISK FACTORS: "Do you have any history of lung disease?"  (e.g., blood clots in lung, asthma, emphysema, birth control pills)     COPD 9. CAUSE: "What do you think is causing the chest pain?"     Patient thinks she has artery problems 10. OTHER SYMPTOMS: "Do you have any other  symptoms?" (e.g., dizziness, nausea, vomiting, sweating, fever, difficulty breathing, cough)       cough 11. PREGNANCY: "Is there any chance you are pregnant?" "When was your last menstrual period?"       n/a  Protocols used: CHEST PAIN-A-AH

## 2018-06-14 NOTE — ED Triage Notes (Signed)
Patient presents to ED via POV from home. Patient reports having a 20 minute episode yesterday of chest pain. Patient reports being under a lot of stress. Patient denies chest pain at this time. Patient reports calling her PCP today to schedule an appointment and they told her to come here. Patient denies any symptoms at this time.

## 2018-06-14 NOTE — ED Notes (Signed)
First Nurse Note: Pt c/o chest pain, states that MD send her to ED for EKG. Pt is in NAD at this time.

## 2018-06-14 NOTE — ED Provider Notes (Signed)
[facility] Emergency Department Provider Note       Time seen: ----------------------------------------- 6:17 PM on 06/14/2018 -----------------------------------------   I have reviewed the triage vital signs and the nursing notes.  HISTORY   Chief Complaint Chest Pain   HPI April Luna is a 47 y.o. female with a history of anxiety, bipolar disorder, depression, GERD, hyperlipidemia and hypertension who presents to the ED for chest pain.  Patient states her doctor sent her to the ER to have an EKG done.  She reports she had a 20-minute episode of chest pain yesterday, she is been under a lot of stress.  She denies any chest pain at this time.  She denies fevers, chills or other complaints.  Past Medical History:  Diagnosis Date  . Allergy    pollen  . Anxiety   . Bipolar 1 disorder ([place])   . Depression   . GERD (gastroesophageal reflux disease)   . Hyperlipidemia   . Hypertension   . Hypothyroidism     Patient Active Problem List   Diagnosis Date Noted  . Pre-diabetes 06/05/2018  . GERD without esophagitis 04/11/2018  . Hyperlipidemia 04/11/2018  . Hypertension 04/11/2018  . Hypothyroidism 04/26/2015  . Bipolar I disorder, most recent episode (or current) manic ([place]) 04/25/2015  . Delirium due to another medical condition 04/25/2015  . Cannabis abuse 04/25/2015    Past Surgical History:  Procedure Laterality Date  . CESAREAN SECTION    . MOUTH SURGERY      Allergies Penicillins; Perphenazine; Sulfa antibiotics; and Abilify [aripiprazole]  Social History Social History   Tobacco Use  . Smoking status: Current Every Day Smoker    Packs/day: 1.50    Years: 30.00    Pack years: 45.00    Types: Cigarettes    Start date: 05/13/1985  . Smokeless tobacco: Never Used  Substance Use Topics  . Alcohol use: Not Currently    Alcohol/week: 7.2 oz    Types: 12 Cans of beer per week  . Drug use: Not Currently    Types: Marijuana    Review of Systems Constitutional: Negative for fever. Cardiovascular: Positive for chest pain Respiratory: Negative for shortness of breath. Gastrointestinal: Negative for abdominal pain, vomiting and diarrhea. Musculoskeletal: Negative for back pain. Skin: Negative for rash. Neurological: Negative for headaches, focal weakness or numbness. Psychiatric: Positive for anxiety  All systems negative/normal/unremarkable except as stated in the HPI  ____________________________________________   PHYSICAL EXAM:  VITAL SIGNS: ED Triage Vitals  Enc Vitals Group     BP 06/14/18 1457 119/75     Pulse Rate 06/14/18 1457 79     Resp 06/14/18 1457 17     Temp 06/14/18 1457 98.7 F (37.1 C)     Temp Source 06/14/18 1457 Oral     SpO2 06/14/18 1457 96 %     Weight 06/14/18 1458 150 lb (68 kg)     Height 06/14/18 1458 4\' 11"  (1.499 m)     Head Circumference --      Peak Flow --      Pain Score 06/14/18 1458 0     Pain Loc --      Pain Edu? --      Excl. in [place]? --    Constitutional: Alert and oriented. Well appearing and in no distress. Eyes: Conjunctivae are normal. Normal extraocular movements. ENT   Head: Normocephalic and atraumatic.   Nose: No congestion/rhinnorhea.   Mouth/Throat: Mucous membranes are moist.   Neck: No stridor.  Cardiovascular: Normal rate, regular rhythm. No murmurs, rubs, or gallops. Respiratory: Normal respiratory effort without tachypnea nor retractions. Breath sounds are clear and equal bilaterally. No wheezes/rales/rhonchi. Gastrointestinal: Soft and nontender. Normal bowel sounds Musculoskeletal: Nontender with normal range of motion in extremities. No lower extremity tenderness nor edema. Neurologic:  Normal speech and language. No gross focal neurologic deficits are appreciated.  Skin:  Skin is warm, dry and intact. No rash noted. Psychiatric: Mood and affect are normal. Speech and behavior are normal.   ____________________________________________  EKG: Interpreted by me.  Sinus rhythm rate of 76 bpm, normal PR interval, normal QRS, normal QT.  ____________________________________________  ED COURSE:  As part of my medical decision making, I reviewed the following data within the [place] History obtained from family if available, nursing notes, old chart and ekg, as well as notes from prior ED visits. Patient presented for chest pain, we will assess with labs and imaging as indicated at this time.   Procedures ____________________________________________   LABS (pertinent positives/negatives)  Labs Reviewed  BASIC METABOLIC PANEL - Abnormal; Notable for the following components:      Result Value   Creatinine, Ser 1.03 (*)    All other components within normal limits  CBC - Abnormal; Notable for the following components:   WBC 12.9 (*)    All other components within normal limits  TROPONIN I    RADIOLOGY  X-ray is normal  ____________________________________________  DIFFERENTIAL DIAGNOSIS   Musculoskeletal pain, GERD, anxiety, panic attack, unstable angina unlikely  FINAL ASSESSMENT AND PLAN  Atypical chest pain   Plan: The patient had presented for chest pain. Patient's labs are reassuring other than mild leukocytosis. Patient's imaging is unremarkable.  This is likely anxiety related.  She is low risk for ACS, I will prescribe Ativan to take as needed for what was likely a panic attack.   Laurence Aly, MD   Note: This note was generated in part or whole with voice recognition software. Voice recognition is usually quite accurate but there are transcription errors that can and very often do occur. I apologize for any typographical errors that were not detected and corrected.     Earleen Newport, MD 06/14/18 [phone]

## 2018-06-14 NOTE — ED Notes (Signed)
ED Provider at bedside. 

## 2018-07-08 ENCOUNTER — Encounter: Payer: Self-pay | Admitting: Urology

## 2018-07-08 ENCOUNTER — Ambulatory Visit (INDEPENDENT_AMBULATORY_CARE_PROVIDER_SITE_OTHER): Payer: Medicare Other | Admitting: Urology

## 2018-07-08 VITALS — BP 117/78 | HR 86 | Ht 59.0 in | Wt 160.0 lb

## 2018-07-08 DIAGNOSIS — N200 Calculus of kidney: Secondary | ICD-10-CM

## 2018-07-08 DIAGNOSIS — N390 Urinary tract infection, site not specified: Secondary | ICD-10-CM | POA: Diagnosis not present

## 2018-07-08 LAB — MICROSCOPIC EXAMINATION

## 2018-07-08 LAB — URINALYSIS, COMPLETE
Bilirubin, UA: NEGATIVE
GLUCOSE, UA: NEGATIVE
KETONES UA: NEGATIVE
Nitrite, UA: POSITIVE — AB
Specific Gravity, UA: >1.03 — ABNORMAL HIGH (ref 1.005–1.030)
Urobilinogen, Ur: 0.2 mg/dL (ref 0.2–1.0)
pH, UA: 5.5 (ref 5.0–7.5)

## 2018-07-08 NOTE — Progress Notes (Signed)
07/08/2018 10:56 AM   April Luna 1971-01-11 [id number]  Referring provider: Steele Sizer, MD [address] [place], [place] [address]  Chief Complaint  Patient presents with  . Recurrent UTI    New Patient    HPI: Consulted to assist the patient recurrent bladder infections.  The patient describes incontinence burning cloudy urine that respond favorably to antibiotics.  She was nonspecific but said her infections are very difficult to clear and they have been ongoing for at least 2 years.  She says she has a kidney stone that must be treated but then noted it was present 5 years ago and did not have specific symptoms otherwise.  She reports being told that her renal function is decreased  At baseline she leaks with coughing sneezing but not bending lifting.  Sometimes she has urge incontinence if she holds it too long.  She does not leak when she is asleep.  She wears 3 small pads a day  On review the medical record she has had recent positive urine cultures.  She had a CT scan in November 2015 with multiple left renal stones and one stone 1.5 cm in size.  She has left hydroureter noted without evidence of obstructing stone.  She is a diet-controlled diabetic.  She has not had a hysterectomy.  She has not had bladder or kidney surgery  Modifying factors: There are no other modifying factors  Associated signs and symptoms: There are no other associated signs and symptoms Aggravating and relieving factors: There are no other aggravating or relieving factors Severity: Moderate Duration: Persistent     PMH: Past Medical History:  Diagnosis Date  . Allergy    pollen  . Anxiety   . Bipolar 1 disorder ([place])   . Depression   . GERD (gastroesophageal reflux disease)   . Hyperlipidemia   . Hypertension   . Hypothyroidism     Surgical History: Past Surgical History:  Procedure Laterality Date  . CESAREAN SECTION    . MOUTH SURGERY      Home  Medications:  Allergies as of 07/08/2018      Reactions   Penicillins Other (See Comments)   GI distress   Perphenazine Other (See Comments)   Tremors, muscle weakness   Sulfa Antibiotics Other (See Comments)   GI distress   Abilify [aripiprazole] Rash      Medication List        Accurate as of 07/08/18 10:56 AM. Always use your most recent med list.          albuterol 108 (90 Base) MCG/ACT inhaler Commonly known as:  PROVENTIL HFA;VENTOLIN HFA Inhale 2 puffs into the lungs every 6 (six) hours as needed for wheezing or shortness of breath.   amantadine 100 MG capsule Commonly known as:  SYMMETREL   buPROPion 150 MG 24 hr tablet Commonly known as:  WELLBUTRIN XL   busPIRone 10 MG tablet Commonly known as:  BUSPAR Take 10 mg by mouth 2 (two) times daily.   DULCOLAX STOOL SOFTENER PO Take by mouth as needed.   gabapentin 300 MG capsule Commonly known as:  NEURONTIN Take 300 mg by mouth 2 (two) times daily.   hydrochlorothiazide 12.5 MG tablet Commonly known as:  HYDRODIURIL Take 1 tablet (12.5 mg total) by mouth daily.   ibuprofen 600 MG tablet Commonly known as:  ADVIL,MOTRIN Take 1 tablet (600 mg total) by mouth every 8 (eight) hours as needed.   lactulose 10 GM/15ML solution Commonly known  as:  CHRONULAC Take 15 mLs (10 g total) by mouth at bedtime.   levothyroxine 75 MCG tablet Commonly known as:  SYNTHROID, LEVOTHROID Take 1 tablet (75 mcg total) by mouth daily at 6 (six) AM.   LORazepam 1 MG tablet Commonly known as:  ATIVAN Take 1 tablet (1 mg total) by mouth every 8 (eight) hours as needed for anxiety.   lubiprostone 24 MCG capsule Commonly known as:  AMITIZA Take 1 capsule (24 mcg total) by mouth 2 (two) times daily with a meal.   nitrofurantoin (macrocrystal-monohydrate) 100 MG capsule Commonly known as:  MACROBID Take 1 capsule (100 mg total) by mouth 2 (two) times daily.   ofloxacin 0.3 % OTIC solution Commonly known as:  FLOXIN Place 5  drops into the left ear daily.   paliperidone 156 MG/ML Susp injection Commonly known as:  INVEGA SUSTENNA Inject 1 mL (156 mg total) into the muscle once.   paliperidone 9 MG 24 hr tablet Commonly known as:  INVEGA Take 1 tablet (9 mg total) by mouth at bedtime.   pantoprazole 40 MG tablet Commonly known as:  PROTONIX Take 1 tablet (40 mg total) by mouth daily at 6 (six) AM.   prazosin 2 MG capsule Commonly known as:  MINIPRESS Take 2 mg by mouth every morning.   rosuvastatin 20 MG tablet Commonly known as:  CRESTOR Take 1 tablet (20 mg total) by mouth daily. In place of pravastatin   sertraline 100 MG tablet Commonly known as:  ZOLOFT Take 100 mg by mouth 2 (two) times daily.   traZODone 100 MG tablet Commonly known as:  DESYREL Take 1 tablet (100 mg total) by mouth at bedtime as needed for sleep.   umeclidinium-vilanterol 62.5-25 MCG/INH Aepb Commonly known as:  ANORO ELLIPTA Inhale 1 puff into the lungs daily.   vitamin B-12 1000 MCG tablet Commonly known as:  CYANOCOBALAMIN Take by mouth.       Allergies:  Allergies  Allergen Reactions  . Penicillins Other (See Comments)    GI distress  . Perphenazine Other (See Comments)    Tremors, muscle weakness  . Sulfa Antibiotics Other (See Comments)    GI distress   . Abilify [Aripiprazole] Rash    Family History: Family History  Problem Relation Age of Onset  . Depression Mother   . Anxiety disorder Mother   . Diabetes Mother   . Hypertension Mother   . Hyperlipidemia Mother   . Cancer Mother   . Cancer Father        colon cancer  . Depression Brother   . Anxiety disorder Brother     Social History:  reports that she has been smoking cigarettes.  She started smoking about 33 years ago. She has a 45.00 pack-year smoking history. She has never used smokeless tobacco. She reports that she drank about 7.2 oz of alcohol per week. She reports that she has current or past drug history. Drug:  Marijuana.  ROS:                                        Physical Exam: There were no vitals taken for this visit.  Constitutional:  Alert and oriented, No acute distress. HEENT: [place] AT, moist mucus membranes.  Trachea midline, no masses. Cardiovascular: No clubbing, cyanosis, or edema. Respiratory: Normal respiratory effort, no increased work of breathing. GI: Abdomen is soft, nontender, nondistended, no  abdominal masses GU: No CVA tenderness.  No bladder tenderness Skin: No rashes, bruises or suspicious lesions. Lymph: No cervical or inguinal adenopathy. Neurologic: Grossly intact, no focal deficits, moving all 4 extremities. Psychiatric: Normal mood and affect.  Laboratory Data: Lab Results  Component Value Date   WBC 12.9 (H) 06/14/2018   HGB 14.0 06/14/2018   HCT 40.8 06/14/2018   MCV 91.3 06/14/2018   PLT 197 06/14/2018    Lab Results  Component Value Date   CREATININE 1.03 (H) 06/14/2018    No results found for: PSA  No results found for: TESTOSTERONE  Lab Results  Component Value Date   HGBA1C 6.4 (H) 04/11/2018    Urinalysis    Component Value Date/Time   COLORURINE YELLOW (A) 01/24/2018 0937   APPEARANCEUR HAZY (A) 01/24/2018 0937   APPEARANCEUR Clear 04/07/2015 1648   LABSPEC 1.013 01/24/2018 0937   LABSPEC 1.009 04/07/2015 1648   PHURINE 6.0 01/24/2018 0937   GLUCOSEU NEGATIVE 01/24/2018 0937   GLUCOSEU Negative 04/07/2015 1648   HGBUR MODERATE (A) 01/24/2018 0937   BILIRUBINUR neg 04/11/2018 1042   BILIRUBINUR Negative 04/07/2015 1648   KETONESUR NEGATIVE 01/24/2018 0937   PROTEINUR + 04/11/2018 1042   PROTEINUR 30 (A) 01/24/2018 0937   UROBILINOGEN 0.2 04/11/2018 1042   NITRITE positive 04/11/2018 1042   NITRITE NEGATIVE 01/24/2018 0937   LEUKOCYTESUR Moderate (2+) (A) 04/11/2018 1042   LEUKOCYTESUR Trace 04/07/2015 1648    Pertinent Imaging: As noted  Assessment & Plan: The patient has recurrent bladder  infections.  She had significant left renal stone burden in 2015.  The role of a cystoscopy pelvic examination for her incontinence and a repeat CT scan was described.  Urine was sent for culture.  Likely I will place her on prophylaxis and see if this controls her infections and down regulate some of her incontinence.  Treatment regarding kidney stone will depend upon x-ray findings.  1. Recurrent UTI 2.  Mixed urinary incontinence - Urinalysis, Complete   No follow-ups on file.  Reece Packer, MD  [facility] [address] [place], [place] [address] [phone]

## 2018-07-10 LAB — CULTURE, URINE COMPREHENSIVE

## 2018-07-11 ENCOUNTER — Telehealth: Payer: Self-pay

## 2018-07-11 MED ORDER — CIPROFLOXACIN HCL 250 MG PO TABS: 250.0000 mg | ORAL_TABLET | Freq: Two times a day (BID) | ORAL | 0 refills | Status: DC

## 2018-07-11 NOTE — Telephone Encounter (Signed)
Patient notified and script sent to pharm

## 2018-07-11 NOTE — Telephone Encounter (Signed)
-----   Message from Bjorn Loser, MD sent at 07/11/2018 12:59 PM EDT ----- Ciprofloxacin 250 mg twice a day for 1 week   ----- Message ----- From: Royanne Foots, CMA Sent: 07/11/2018   8:49 AM To: Bjorn Loser, MD    ----- Message ----- From: Interface, [facility] Lab Results In Sent: 07/08/2018   1:36 PM To: [facility]

## 2018-08-06 ENCOUNTER — Telehealth: Payer: Self-pay

## 2018-08-06 NOTE — Telephone Encounter (Addendum)
Called pt to sched AWV w/ [facility]. Unable to LVM requesting returned call d/t no VM.

## 2018-08-12 ENCOUNTER — Encounter: Payer: Self-pay | Admitting: Urology

## 2018-08-12 ENCOUNTER — Ambulatory Visit: Payer: Medicare Other | Admitting: Urology

## 2018-08-12 VITALS — BP 143/91 | HR 85 | Ht 59.0 in | Wt 148.6 lb

## 2018-08-12 DIAGNOSIS — N390 Urinary tract infection, site not specified: Secondary | ICD-10-CM | POA: Diagnosis not present

## 2018-08-12 LAB — URINALYSIS, COMPLETE
Bilirubin, UA: NEGATIVE
GLUCOSE, UA: NEGATIVE
Ketones, UA: NEGATIVE
NITRITE UA: POSITIVE — AB
PH UA: 5.5 (ref 5.0–7.5)
Specific Gravity, UA: >1.03 — ABNORMAL HIGH (ref 1.005–1.030)
UUROB: 0.2 mg/dL (ref 0.2–1.0)

## 2018-08-12 LAB — MICROSCOPIC EXAMINATION: EPITHELIAL CELLS (NON RENAL): NONE SEEN /HPF (ref 0–10)

## 2018-08-12 MED ORDER — CIPROFLOXACIN HCL 500 MG PO TABS: 500.0000 mg | ORAL_TABLET | Freq: Once | ORAL | Status: DC

## 2018-08-12 MED ORDER — LIDOCAINE HCL URETHRAL/MUCOSAL 2 % EX GEL: 1.0000 "application " | Freq: Once | CUTANEOUS | Status: DC

## 2018-08-12 MED ORDER — TRIMETHOPRIM 100 MG PO TABS: 100.0000 mg | ORAL_TABLET | Freq: Every day | ORAL | 11 refills | Status: DC

## 2018-08-12 MED ORDER — CIPROFLOXACIN HCL 250 MG PO TABS: 250.0000 mg | ORAL_TABLET | Freq: Two times a day (BID) | ORAL | 0 refills | Status: DC

## 2018-08-12 NOTE — Progress Notes (Signed)
08/12/2018 11:10 AM   Clayton Lefort 1971-09-02 [id number]  Referring provider: Steele Sizer, MD [address] [place], [place] [address]  Chief Complaint  Patient presents with  . Recurrent UTI    HPI: Consulted to assist the patient recurrent bladder infections.  The patient describes incontinence burning cloudy urine that respond favorably to antibiotics.  She was nonspecific but said her infections are very difficult to clear   She says she has a kidney stone that must be treated but then noted it was present 5 years ago and did not have specific symptoms otherwise.  She reports being told that her renal function is decreased  At baseline she leaks with coughing sneezing but not bending lifting.  Sometimes she has urge incontinence if she holds it too long.  She does not leak when she is asleep.  She wears 3 small pads a day  On review the medical record she has had recent positive urine cultures.  She had a CT scan in November 2015 with multiple left renal stones and one stone 1.5 cm in size.  She has left hydroureter noted without evidence of obstructing stone.  The patient has recurrent bladder infections.  She had significant left renal stone burden in 2015.  The role of a cystoscopy pelvic examination for her incontinence and a repeat CT scan was described.  Urine was sent for culture.  Likely I will place her on prophylaxis and see if this controls her infections and down regulate some of her incontinence.  Treatment regarding kidney stone will depend upon x-ray findings.  Today Frequency is stable.  She thought she was infected again and we sent the urine for culture.  Last culture was positive..  It is not appear that she ever had her stone study CT scan ordered.  Last culture positive.  She has burning and clinically is infected  Modifying factors: There are no other modifying factors  Associated signs and symptoms: There are no other associated  signs and symptoms Aggravating and relieving factors: There are no other aggravating or relieving factors Severity: Moderate Duration: Persistent   PMH: Past Medical History:  Diagnosis Date  . Allergy    pollen  . Anxiety   . Bipolar 1 disorder ([place])   . Depression   . GERD (gastroesophageal reflux disease)   . Hyperlipidemia   . Hypertension   . Hypothyroidism     Surgical History: Past Surgical History:  Procedure Laterality Date  . CESAREAN SECTION    . MOUTH SURGERY      Home Medications:  Allergies as of 08/12/2018      Reactions   Penicillins Other (See Comments)   GI distress   Perphenazine Other (See Comments)   Tremors, muscle weakness   Sulfa Antibiotics Other (See Comments)   GI distress   Abilify [aripiprazole] Rash      Medication List        Accurate as of 08/12/18 11:10 AM. Always use your most recent med list.          albuterol 108 (90 Base) MCG/ACT inhaler Commonly known as:  PROVENTIL HFA;VENTOLIN HFA Inhale 2 puffs into the lungs every 6 (six) hours as needed for wheezing or shortness of breath.   amantadine 100 MG capsule Commonly known as:  SYMMETREL   buPROPion 150 MG 24 hr tablet Commonly known as:  WELLBUTRIN XL   busPIRone 10 MG tablet Commonly known as:  BUSPAR Take 10 mg by mouth 2 (two)  times daily.   DULCOLAX STOOL SOFTENER PO Take by mouth as needed.   gabapentin 300 MG capsule Commonly known as:  NEURONTIN Take 300 mg by mouth 2 (two) times daily.   hydrochlorothiazide 12.5 MG tablet Commonly known as:  HYDRODIURIL Take 1 tablet (12.5 mg total) by mouth daily.   ibuprofen 600 MG tablet Commonly known as:  ADVIL,MOTRIN Take 1 tablet (600 mg total) by mouth every 8 (eight) hours as needed.   lactulose 10 GM/15ML solution Commonly known as:  CHRONULAC Take 15 mLs (10 g total) by mouth at bedtime.   levothyroxine 75 MCG tablet Commonly known as:  SYNTHROID, LEVOTHROID Take 1 tablet (75 mcg total) by mouth  daily at 6 (six) AM.   LORazepam 1 MG tablet Commonly known as:  ATIVAN Take 1 tablet (1 mg total) by mouth every 8 (eight) hours as needed for anxiety.   lubiprostone 24 MCG capsule Commonly known as:  AMITIZA Take 1 capsule (24 mcg total) by mouth 2 (two) times daily with a meal.   nitrofurantoin (macrocrystal-monohydrate) 100 MG capsule Commonly known as:  MACROBID Take 1 capsule (100 mg total) by mouth 2 (two) times daily.   ofloxacin 0.3 % OTIC solution Commonly known as:  FLOXIN Place 5 drops into the left ear daily.   paliperidone 156 MG/ML Susp injection Commonly known as:  INVEGA SUSTENNA Inject 1 mL (156 mg total) into the muscle once.   paliperidone 9 MG 24 hr tablet Commonly known as:  INVEGA Take 1 tablet (9 mg total) by mouth at bedtime.   pantoprazole 40 MG tablet Commonly known as:  PROTONIX Take 1 tablet (40 mg total) by mouth daily at 6 (six) AM.   prazosin 2 MG capsule Commonly known as:  MINIPRESS Take 2 mg by mouth every morning.   rosuvastatin 20 MG tablet Commonly known as:  CRESTOR Take 1 tablet (20 mg total) by mouth daily. In place of pravastatin   sertraline 100 MG tablet Commonly known as:  ZOLOFT Take 100 mg by mouth 2 (two) times daily.   traZODone 100 MG tablet Commonly known as:  DESYREL Take 1 tablet (100 mg total) by mouth at bedtime as needed for sleep.   umeclidinium-vilanterol 62.5-25 MCG/INH Aepb Commonly known as:  ANORO ELLIPTA Inhale 1 puff into the lungs daily.   vitamin B-12 1000 MCG tablet Commonly known as:  CYANOCOBALAMIN Take by mouth.       Allergies:  Allergies  Allergen Reactions  . Penicillins Other (See Comments)    GI distress  . Perphenazine Other (See Comments)    Tremors, muscle weakness  . Sulfa Antibiotics Other (See Comments)    GI distress   . Abilify [Aripiprazole] Rash    Family History: Family History  Problem Relation Age of Onset  . Depression Mother   . Anxiety disorder Mother     . Diabetes Mother   . Hypertension Mother   . Hyperlipidemia Mother   . Cancer Mother   . Cancer Father        colon cancer  . Depression Brother   . Anxiety disorder Brother     Social History:  reports that she has been smoking cigarettes. She started smoking about 33 years ago. She has a 45.00 pack-year smoking history. She has never used smokeless tobacco. She reports that she drank about 12.0 standard drinks of alcohol per week. She reports that she has current or past drug history. Drug: Marijuana.  ROS:  Physical Exam: BP (!) 143/91 (BP Location: Left Arm, Patient Position: Sitting, Cuff Size: Normal)   Pulse 85   Ht 4\' 11"  (1.499 m)   Wt 148 lb 9.6 oz (67.4 kg)   BMI 30.01 kg/m   Constitutional:  Alert and oriented, No acute distress.   Laboratory Data: Lab Results  Component Value Date   WBC 12.9 (H) 06/14/2018   HGB 14.0 06/14/2018   HCT 40.8 06/14/2018   MCV 91.3 06/14/2018   PLT 197 06/14/2018    Lab Results  Component Value Date   CREATININE 1.03 (H) 06/14/2018    No results found for: PSA  No results found for: TESTOSTERONE  Lab Results  Component Value Date   HGBA1C 6.4 (H) 04/11/2018    Urinalysis    Component Value Date/Time   COLORURINE YELLOW (A) 01/24/2018 0937   APPEARANCEUR Cloudy (A) 07/08/2018 1050   LABSPEC 1.013 01/24/2018 0937   LABSPEC 1.009 04/07/2015 1648   PHURINE 6.0 01/24/2018 0937   GLUCOSEU Negative 07/08/2018 1050   GLUCOSEU Negative 04/07/2015 1648   HGBUR MODERATE (A) 01/24/2018 0937   BILIRUBINUR Negative 07/08/2018 1050   BILIRUBINUR Negative 04/07/2015 1648   KETONESUR NEGATIVE 01/24/2018 0937   PROTEINUR 2+ (A) 07/08/2018 1050   PROTEINUR 30 (A) 01/24/2018 0937   UROBILINOGEN 0.2 04/11/2018 1042   NITRITE Positive (A) 07/08/2018 1050   NITRITE NEGATIVE 01/24/2018 0937   LEUKOCYTESUR 1+ (A) 07/08/2018 1050   LEUKOCYTESUR Trace 04/07/2015 1648     Pertinent Imaging:   Assessment & Plan: Call in ciprofloxacin for a week.  Then she will start a daily trimethoprim.  See in 7 weeks for pelvic examination cystoscopy.  Reorder CT stone protocol  1. Recurrent UTI  - Urinalysis, Complete - ciprofloxacin (CIPRO) tablet 500 mg - lidocaine (XYLOCAINE) 2 % jelly 1 application   No follow-ups on file.  Reece Packer, MD  [facility] [address], [place], [place] [address] [phone]

## 2018-08-13 ENCOUNTER — Other Ambulatory Visit: Payer: Self-pay

## 2018-08-13 DIAGNOSIS — K5909 Other constipation: Secondary | ICD-10-CM

## 2018-08-13 MED ORDER — LUBIPROSTONE 24 MCG PO CAPS: 24.0000 ug | ORAL_CAPSULE | Freq: Two times a day (BID) | ORAL | 0 refills | Status: DC

## 2018-08-13 NOTE — Telephone Encounter (Signed)
Refill request was sent to Dr. K[place]na Sowles for approval and submission.  

## 2018-08-14 LAB — CULTURE, URINE COMPREHENSIVE

## 2018-08-27 ENCOUNTER — Other Ambulatory Visit: Payer: Self-pay

## 2018-08-27 DIAGNOSIS — I10 Essential (primary) hypertension: Secondary | ICD-10-CM

## 2018-08-27 MED ORDER — HYDROCHLOROTHIAZIDE 12.5 MG PO TABS: 12.5000 mg | ORAL_TABLET | Freq: Every day | ORAL | 0 refills | Status: DC

## 2018-08-27 NOTE — Telephone Encounter (Signed)
Refill request for general medication. [facility]   Last office visit 05/14/2018  Follow up on 09/02/2018

## 2018-09-02 ENCOUNTER — Other Ambulatory Visit (HOSPITAL_COMMUNITY)
Admission: RE | Admit: 2018-09-02 | Discharge: 2018-09-02 | Disposition: A | Discharge status: Home / Self Care | Payer: Medicare Other | Source: Ambulatory Visit | Attending: Family Medicine | Admitting: Family Medicine

## 2018-09-02 ENCOUNTER — Ambulatory Visit (INDEPENDENT_AMBULATORY_CARE_PROVIDER_SITE_OTHER): Payer: Medicare Other | Admitting: Family Medicine

## 2018-09-02 ENCOUNTER — Encounter: Payer: Self-pay | Admitting: Family Medicine

## 2018-09-02 VITALS — BP 130/84 | HR 108 | Temp 97.9°F | Resp 16 | Ht 59.0 in | Wt 148.2 lb

## 2018-09-02 DIAGNOSIS — F3110 Bipolar disorder, current episode manic without psychotic features, unspecified: Secondary | ICD-10-CM

## 2018-09-02 DIAGNOSIS — Z01419 Encounter for gynecological examination (general) (routine) without abnormal findings: Secondary | ICD-10-CM | POA: Insufficient documentation

## 2018-09-02 DIAGNOSIS — E032 Hypothyroidism due to medicaments and other exogenous substances: Secondary | ICD-10-CM

## 2018-09-02 DIAGNOSIS — K219 Gastro-esophageal reflux disease without esophagitis: Secondary | ICD-10-CM

## 2018-09-02 DIAGNOSIS — Z23 Encounter for immunization: Secondary | ICD-10-CM | POA: Diagnosis not present

## 2018-09-02 DIAGNOSIS — E782 Mixed hyperlipidemia: Secondary | ICD-10-CM | POA: Diagnosis not present

## 2018-09-02 DIAGNOSIS — I10 Essential (primary) hypertension: Secondary | ICD-10-CM | POA: Diagnosis not present

## 2018-09-02 DIAGNOSIS — K5909 Other constipation: Secondary | ICD-10-CM | POA: Diagnosis not present

## 2018-09-02 DIAGNOSIS — R739 Hyperglycemia, unspecified: Secondary | ICD-10-CM | POA: Diagnosis not present

## 2018-09-02 DIAGNOSIS — Z124 Encounter for screening for malignant neoplasm of cervix: Secondary | ICD-10-CM

## 2018-09-02 DIAGNOSIS — J411 Mucopurulent chronic bronchitis: Secondary | ICD-10-CM | POA: Diagnosis not present

## 2018-09-02 DIAGNOSIS — R7303 Prediabetes: Secondary | ICD-10-CM

## 2018-09-02 DIAGNOSIS — F17210 Nicotine dependence, cigarettes, uncomplicated: Secondary | ICD-10-CM

## 2018-09-02 DIAGNOSIS — E663 Overweight: Secondary | ICD-10-CM

## 2018-09-02 DIAGNOSIS — N390 Urinary tract infection, site not specified: Secondary | ICD-10-CM

## 2018-09-02 MED ORDER — ALBUTEROL SULFATE HFA 108 (90 BASE) MCG/ACT IN AERS: 2.0000 | INHALATION_SPRAY | Freq: Four times a day (QID) | RESPIRATORY_TRACT | 0 refills | Status: DC | PRN

## 2018-09-02 MED ORDER — ROSUVASTATIN CALCIUM 20 MG PO TABS: 20.0000 mg | ORAL_TABLET | Freq: Every day | ORAL | 5 refills | Status: DC

## 2018-09-02 MED ORDER — UMECLIDINIUM-VILANTEROL 62.5-25 MCG/INH IN AEPB: 1.0000 | INHALATION_SPRAY | Freq: Every day | RESPIRATORY_TRACT | 5 refills | Status: DC

## 2018-09-02 MED ORDER — PANTOPRAZOLE SODIUM 40 MG PO TBEC: 40.0000 mg | DELAYED_RELEASE_TABLET | Freq: Every day | ORAL | 5 refills | Status: DC

## 2018-09-02 MED ORDER — HYDROCHLOROTHIAZIDE 12.5 MG PO TABS: 12.5000 mg | ORAL_TABLET | Freq: Every day | ORAL | 5 refills | Status: DC

## 2018-09-02 MED ORDER — LUBIPROSTONE 24 MCG PO CAPS: 24.0000 ug | ORAL_CAPSULE | Freq: Every day | ORAL | 5 refills | Status: DC

## 2018-09-02 MED ORDER — NICOTINE 21-14-7 MG/24HR TD KIT: 1.0000 | PACK | Freq: Every day | TRANSDERMAL | 0 refills | Status: DC

## 2018-09-02 NOTE — Progress Notes (Addendum)
nicName: April Luna   MRN: [id number]    DOB: [birth year]   Date:09/02/2018       Progress Note  Subjective  Chief Complaint  Chief Complaint  Patient presents with  . Annual Exam  . COPD    SOB, Wheezing and Coughing  . Hypertension    Left Knee was swollen for a week  . Hyperlipidemia  . Constipation    Has been a whole lot better-twice a day  . Manic Behavior  . Obesity    HPI   Patient presents for annual CPE and follow up  COPD: she is now on Anoro Elipta since April 2019, she states cough not as often, but when productive and white in color. SOB has improved and usually only present in am's with coughing spells. Still smoking, and states difficulty quitting because has two other family members that also smoke in the house, however she is willing to try Nicotine patch   HTN:bp is at goal, no chest pain or palpitation.   Hyperlipidemia: we switched from Pravachol to Crestor back in April we will recheck labs today. No myalgia  Recurrent UTI: seeing Urologist, she has recurrent UTI infections, denies dysuria at this time  Chronic constipation: she states she has been taking Amitiza BID and would like to go down ton once a day because it is causing diarrhea.   Pre-diabetes: she denies polyphagia, but has episodes of polydipsia and polyuria. Last hgbA1C was 6.4% and we will recheck it today   Bipolar disorder: she states she is doing okay, she has noticed increase in anxiety for the past couple of weeks, she was given lorazepam per [facility] and still has 6 pills left, also feels depressed, under the care of psychiatrist, denies suicidal thoughts or ideation   Diet:  Cooking pizza and junk for son and is eating like he does, discussed life style modification  Exercise: discussed importance of increase physical activity to 150 minutes week  USPSTF grade A and B recommendations   Depression:  Depression screen [facility] 2/9 09/02/2018 05/14/2018 04/11/2018  Decreased Interest  '2 1 1  ' Down, Depressed, Hopeless 0 0 0  PHQ - 2 Score '2 1 1  ' Altered sleeping 3 3 -  Tired, decreased energy 3 3 -  Change in appetite 3 1 -  Feeling bad or failure about yourself  0 1 -  Trouble concentrating 2 2 -  Moving slowly or fidgety/restless 3 1 -  Suicidal thoughts 0 0 -  PHQ-9 Score 16 12 -  Difficult doing work/chores Somewhat difficult Somewhat difficult -   Hypertension: BP Readings from Last 3 Encounters:  09/02/18 130/84  08/12/18 (!) 143/91  07/08/18 117/78   Obesity: Wt Readings from Last 3 Encounters:  09/02/18 148 lb 3.2 oz (67.2 kg)  08/12/18 148 lb 9.6 oz (67.4 kg)  07/08/18 160 lb (72.6 kg)   BMI Readings from Last 3 Encounters:  09/02/18 29.93 kg/m  08/12/18 30.01 kg/m  07/08/18 32.32 kg/m     STD testing and prevention (HIV/chl/gon/syphilis): N/A Intimate partner violence: negative screen Sexual History/Pain during Intercourse: not sexually active with husband in years , he has ED Menstrual History/LMP/Abnormal Bleeding: post-menopausal  Incontinence Symptoms:  She has stress incontinence seeing urologist   Advanced Care Planning: A voluntary discussion about advance care planning including the explanation and discussion of advance directives.  Discussed health care proxy and Living will, and the patient was able to identify a health care proxy as husband  Patient does  not have a living will at present time. Breast cancer: she will call to schedule appointment  BRCA gene screening: N/A Cervical cancer screening: today   Osteoporosis Screening:  No results found for: HMDEXASCAN  Lipids:  Lab Results  Component Value Date   CHOL 217 (H) 04/11/2018   CHOL 196 01/08/2015   CHOL 155 07/23/2014   Lab Results  Component Value Date   HDL 39 (L) 04/11/2018   HDL 58 01/08/2015   HDL 44 07/23/2014   Lab Results  Component Value Date   LDLCALC 141 (H) 04/11/2018   LDLCALC 96 01/08/2015   LDLCALC 85 07/23/2014   Lab Results  Component  Value Date   TRIG 220 (H) 04/11/2018   TRIG 210 (H) 01/08/2015   TRIG 131 07/23/2014   Lab Results  Component Value Date   CHOLHDL 5.6 (H) 04/11/2018   No results found for: LDLDIRECT  Glucose:  Glucose  Date Value Ref Range Status  04/20/2015 95 mg/dL Final    Comment:    65-99 NOTE: New Reference Range  03/02/15   04/06/2015 123 (H) mg/dL Final    Comment:    65-99 NOTE: New Reference Range  03/02/15   04/05/2015 111 (H) mg/dL Final    Comment:    65-99 NOTE: New Reference Range  03/02/15    Glucose, Bld  Date Value Ref Range Status  06/14/2018 76 65 - 99 mg/dL Final  04/11/2018 93 65 - 99 mg/dL Final    Comment:    .            Fasting reference interval .   02/02/2017 87 65 - 99 mg/dL Final    Skin cancer: discussed atypical lesions ECG: 05/2018   Patient Active Problem List   Diagnosis Date Noted  . Pre-diabetes 06/05/2018  . GERD without esophagitis 04/11/2018  . Hyperlipidemia 04/11/2018  . Hypertension 04/11/2018  . Hypothyroidism 04/26/2015  . Bipolar I disorder, most recent episode (or current) manic ([place]) 04/25/2015  . Delirium due to another medical condition 04/25/2015  . Cannabis abuse 04/25/2015    Past Surgical History:  Procedure Laterality Date  . CESAREAN SECTION    . MOUTH SURGERY      Family History  Problem Relation Age of Onset  . Depression Mother   . Anxiety disorder Mother   . Diabetes Mother   . Hypertension Mother   . Hyperlipidemia Mother   . Cancer Mother   . Cancer Father        colon cancer  . Depression Brother   . Anxiety disorder Brother     Social History   Socioeconomic History  . Marital status: Married    Spouse name: Montine Circle  . Number of children: 1  . Years of education: 25  . Highest education level: High school graduate  Occupational History  . Occupation: unemployed    Comment: disabled  Social Needs  . Financial resource strain: Not hard at all  . Food insecurity:     Worry: Never true    Inability: Never true  . Transportation needs:    Medical: Yes    Non-medical: Yes  Tobacco Use  . Smoking status: Current Every Day Smoker    Packs/day: 0.50    Years: 33.00    Pack years: 16.50    Types: Cigarettes    Start date: 05/13/1985  . Smokeless tobacco: Never Used  Substance and Sexual Activity  . Alcohol use: Not Currently    Alcohol/week: 12.0  standard drinks    Types: 12 Cans of beer per week  . Drug use: Not Currently    Types: Marijuana  . Sexual activity: Not Currently    Birth control/protection: Post-menopausal  Lifestyle  . Physical activity:    Days per week: 3 days    Minutes per session: 40 min  . Stress: To some extent  Relationships  . Social connections:    Talks on phone: Three times a week    Gets together: Once a week    Attends religious service: Never    Active member of club or organization: No    Attends meetings of clubs or organizations: Never    Relationship status: Married  . Intimate partner violence:    Fear of current or ex partner: No    Emotionally abused: No    Physically abused: No    Forced sexual activity: No  Other Topics Concern  . Not on file  Social History Narrative  . Not on file     Current Outpatient Medications:  .  albuterol (PROVENTIL HFA;VENTOLIN HFA) 108 (90 Base) MCG/ACT inhaler, Inhale 2 puffs into the lungs every 6 (six) hours as needed for wheezing or shortness of breath., Disp: 1 Inhaler, Rfl: 0 .  amantadine (SYMMETREL) 100 MG capsule, Take 100 mg by mouth daily. , Disp: , Rfl:  .  buPROPion (WELLBUTRIN XL) 150 MG 24 hr tablet, , Disp: , Rfl:  .  busPIRone (BUSPAR) 10 MG tablet, Take 15 mg by mouth 2 (two) times daily. , Disp: , Rfl:  .  gabapentin (NEURONTIN) 300 MG capsule, Take 300 mg by mouth 2 (two) times daily., Disp: , Rfl:  .  hydrochlorothiazide (HYDRODIURIL) 12.5 MG tablet, Take 1 tablet (12.5 mg total) by mouth daily., Disp: 30 tablet, Rfl: 5 .  levothyroxine  (SYNTHROID, LEVOTHROID) 75 MCG tablet, Take 1 tablet (75 mcg total) by mouth daily at 6 (six) AM., Disp: 90 tablet, Rfl: 1 .  LORazepam (ATIVAN) 1 MG tablet, Take 1 tablet (1 mg total) by mouth every 8 (eight) hours as needed for anxiety., Disp: 20 tablet, Rfl: 0 .  lubiprostone (AMITIZA) 24 MCG capsule, Take 1 capsule (24 mcg total) by mouth daily with breakfast., Disp: 30 capsule, Rfl: 5 .  paliperidone (INVEGA SUSTENNA) 156 MG/ML SUSP injection, Inject 1 mL (156 mg total) into the muscle once., Disp: 0.9 mL, Rfl: 0 .  pantoprazole (PROTONIX) 40 MG tablet, Take 1 tablet (40 mg total) by mouth daily at 6 (six) AM., Disp: 30 tablet, Rfl: 5 .  rosuvastatin (CRESTOR) 20 MG tablet, Take 1 tablet (20 mg total) by mouth daily. In place of pravastatin, Disp: 30 tablet, Rfl: 5 .  sertraline (ZOLOFT) 100 MG tablet, Take 100 mg by mouth 2 (two) times daily., Disp: , Rfl:  .  trimethoprim (TRIMPEX) 100 MG tablet, Take 1 tablet (100 mg total) by mouth daily., Disp: 30 tablet, Rfl: 11 .  umeclidinium-vilanterol (ANORO ELLIPTA) 62.5-25 MCG/INH AEPB, Inhale 1 puff into the lungs daily., Disp: 60 each, Rfl: 5 .  Docusate Sodium (DULCOLAX STOOL SOFTENER PO), Take by mouth as needed., Disp: , Rfl:  .  lactulose (CHRONULAC) 10 GM/15ML solution, Take 15 mLs (10 g total) by mouth at bedtime., Disp: 240 mL, Rfl: 0 .  prazosin (MINIPRESS) 2 MG capsule, Take 2 mg by mouth every morning., Disp: , Rfl:  .  vitamin B-12 (CYANOCOBALAMIN) 1000 MCG tablet, Take by mouth., Disp: , Rfl:   Allergies  Allergen Reactions  .  Penicillins Other (See Comments)    GI distress  . Perphenazine Other (See Comments)    Tremors, muscle weakness  . Sulfa Antibiotics Other (See Comments)    GI distress   . Abilify [Aripiprazole] Rash     ROS   Constitutional: Negative for fever or weight change.  Respiratory: Negative for cough and shortness of breath.   Cardiovascular: Negative for chest pain or palpitations.  Gastrointestinal:  Negative for abdominal pain, no bowel changes.  Musculoskeletal: Negative for gait problem or joint swelling.  Skin: Negative for rash.  Neurological: Negative for dizziness or headache.  No other specific complaints in a complete review of systems (except as listed in HPI above).  Objective  Vitals:   09/02/18 1355  BP: 130/84  Pulse: (!) 108  Resp: 16  Temp: 97.9 F (36.6 C)  TempSrc: Oral  SpO2: 97%  Weight: 148 lb 3.2 oz (67.2 kg)  Height: '4\' 11"'  (1.499 m)    Body mass index is 29.93 kg/m.  Physical Exam  Constitutional: Patient appears well-developed and well-nourished. No distress.  HENT: Head: Normocephalic and atraumatic. Ears: B TMs ok, no erythema or effusion; Nose: Nose normal. Mouth/Throat: Oropharynx is clear and moist. No oropharyngeal exudate. poor dentition  Eyes: Conjunctivae and EOM are normal. Pupils are equal, round, and reactive to light. No scleral icterus.  Neck: Normal range of motion. Neck supple. No JVD present. No thyromegaly present.  Cardiovascular: Normal rate, regular rhythm and normal heart sounds.  No murmur heard. No BLE edema. Pulmonary/Chest: Effort is normal, wheezing and rhonchi. No respiratory distress. Abdominal: Soft. Bowel sounds are normal, no distension. There is no tenderness. no masses Breast: no lumps or masses, no nipple discharge or rashes FEMALE GENITALIA:  External genitalia normal External urethra normal Vaginal vault normal without discharge or lesions Cervix normal without discharge or lesions Bimanual exam normal without masses RECTAL: not done Musculoskeletal: Normal range of motion, no joint effusions. No gross deformities Neurological: he is alert and oriented to person, place, and time. No cranial nerve deficit. Coordination, balance, strength, speech and gait are normal.  Skin: Skin is warm and dry. No rash noted. No erythema.  Psychiatric: Patient has a normal mood and affect. behavior is normal. Judgment and  thought content normal.   Recent Results (from the past 2160 hour(s))  Basic metabolic panel     Status: Abnormal   Collection Time: 06/14/18  3:03 PM  Result Value Ref Range   Sodium 136 135 - 145 mmol/L   Potassium 3.8 3.5 - 5.1 mmol/L   Chloride 104 101 - 111 mmol/L   CO2 22 22 - 32 mmol/L   Glucose, Bld 76 65 - 99 mg/dL   BUN 16 6 - 20 mg/dL   Creatinine, Ser 1.03 (H) 0.44 - 1.00 mg/dL   Calcium 9.2 8.9 - 10.3 mg/dL   GFR calc non Af Amer >60 >60 mL/min   GFR calc Af Amer >60 >60 mL/min    Comment: (NOTE) The eGFR has been calculated using the CKD EPI equation. This calculation has not been validated in all clinical situations. eGFR's persistently <60 mL/min signify possible Chronic Kidney Disease.    Anion gap 10 5 - 15    Comment: Performed at [facility], [address] [place], [place] [address]  CBC     Status: Abnormal   Collection Time: 06/14/18  3:03 PM  Result Value Ref Range   WBC 12.9 (H) 3.6 - 11.0 K/uL   RBC 4.47  3.80 - 5.20 MIL/uL   Hemoglobin 14.0 12.0 - 16.0 g/dL   HCT 40.8 35.0 - 47.0 %   MCV 91.3 80.0 - 100.0 fL   MCH 31.2 26.0 - 34.0 pg   MCHC 34.2 32.0 - 36.0 g/dL   RDW 14.5 11.5 - 14.5 %   Platelets 197 150 - 440 K/uL    Comment: Performed at [facility], [address] [place], [place] [address]  Troponin I     Status: None   Collection Time: 06/14/18  3:03 PM  Result Value Ref Range   Troponin I <0.03 <0.03 ng/mL    Comment: Performed at [facility], [address] [place], [place] [address]  Urinalysis, Complete     Status: Abnormal   Collection Time: 07/08/18 10:50 AM  Result Value Ref Range   Specific Gravity, UA >1.030 (H) 1.005 - 1.030   pH, UA 5.5 5.0 - 7.5   Color, UA Yellow Yellow   Appearance Ur Cloudy (A) Clear   Leukocytes, UA 1+ (A) Negative   Protein, UA 2+ (A) Negative/Trace   Glucose, UA Negative Negative   Ketones, UA Negative Negative   RBC, UA 2+ (A) Negative   Bilirubin, UA  Negative Negative   Urobilinogen, Ur 0.2 0.2 - 1.0 mg/dL   Nitrite, UA Positive (A) Negative   Microscopic Examination See below:   Microscopic Examination     Status: Abnormal   Collection Time: 07/08/18 10:50 AM  Result Value Ref Range   WBC, UA 11-30 (A) 0 - 5 /hpf   RBC, UA 3-10 (A) 0 - 2 /hpf   Epithelial Cells (non renal) 0-10 0 - 10 /hpf   Mucus, UA Present (A) Not Estab.   Bacteria, UA Many (A) None seen/Few  CULTURE, URINE COMPREHENSIVE     Status: Abnormal   Collection Time: 07/08/18 12:51 PM  Result Value Ref Range   Urine Culture, Comprehensive Final report (A)    Organism ID, Bacteria Escherichia coli (A)     Comment: Greater than 100,000 colony forming units per mL Cefazolin <=4 ug/mL Cefazolin with an MIC <=16 predicts susceptibility to the oral agents cefaclor, cefdinir, cefpodoxime, cefprozil, cefuroxime, cephalexin, and loracarbef when used for therapy of uncomplicated urinary tract infections due to E. coli, Klebsiella pneumoniae, and Proteus mirabilis.    ANTIMICROBIAL SUSCEPTIBILITY Comment     Comment:       ** S = Susceptible; I = Intermediate; R = Resistant **                    P = Positive; N = Negative             MICS are expressed in micrograms per mL    Antibiotic                 RSLT#1    RSLT#2    RSLT#3    RSLT#4 Amoxicillin/Clavulanic Acid    S Ampicillin                     S Cefepime                       S Ceftriaxone                    S Cefuroxime                     S Ciprofloxacin  S Ertapenem                      S Gentamicin                     S Imipenem                       S Levofloxacin                   S Meropenem                      S Nitrofurantoin                 R Piperacillin/Tazobactam        S Tetracycline                   S Tobramycin                     S Trimethoprim/Sulfa             S   Urinalysis, Complete     Status: Abnormal   Collection Time: 08/12/18 10:56 AM  Result Value Ref Range    Specific Gravity, UA >1.030 (H) 1.005 - 1.030   pH, UA 5.5 5.0 - 7.5   Color, UA Yellow Yellow   Appearance Ur Cloudy (A) Clear   Leukocytes, UA 1+ (A) Negative   Protein, UA 2+ (A) Negative/Trace   Glucose, UA Negative Negative   Ketones, UA Negative Negative   RBC, UA 3+ (A) Negative   Bilirubin, UA Negative Negative   Urobilinogen, Ur 0.2 0.2 - 1.0 mg/dL   Nitrite, UA Positive (A) Negative   Microscopic Examination See below:   Microscopic Examination     Status: Abnormal   Collection Time: 08/12/18 10:56 AM  Result Value Ref Range   WBC, UA 11-30 (A) 0 - 5 /hpf   RBC, UA 11-30 (A) 0 - 2 /hpf   Epithelial Cells (non renal) None seen 0 - 10 /hpf   Mucus, UA Present (A) Not Estab.   Bacteria, UA Many (A) None seen/Few  CULTURE, URINE COMPREHENSIVE     Status: Abnormal   Collection Time: 08/12/18 11:15 AM  Result Value Ref Range   Urine Culture, Comprehensive Final report (A)    Organism ID, Bacteria Escherichia coli (A)     Comment: 50,000-100,000 colony forming units per mL Cefazolin <=4 ug/mL Cefazolin with an MIC <=16 predicts susceptibility to the oral agents cefaclor, cefdinir, cefpodoxime, cefprozil, cefuroxime, cephalexin, and loracarbef when used for therapy of uncomplicated urinary tract infections due to E. coli, Klebsiella pneumoniae, and Proteus mirabilis.    ANTIMICROBIAL SUSCEPTIBILITY Comment     Comment:       ** S = Susceptible; I = Intermediate; R = Resistant **                    P = Positive; N = Negative             MICS are expressed in micrograms per mL    Antibiotic                 RSLT#1    RSLT#2    RSLT#3    RSLT#4 Amoxicillin/Clavulanic Acid    S Ampicillin  S Cefepime                       S Ceftriaxone                    S Cefuroxime                     S Ciprofloxacin                  S Ertapenem                      S Gentamicin                     S Imipenem                       S Levofloxacin                    S Meropenem                      S Nitrofurantoin                 S Piperacillin/Tazobactam        S Tetracycline                   S Tobramycin                     S Trimethoprim/Sulfa             S       PHQ2/9: Depression screen [facility] 2/9 09/02/2018 05/14/2018 04/11/2018  Decreased Interest '2 1 1  ' Down, Depressed, Hopeless 0 0 0  PHQ - 2 Score '2 1 1  ' Altered sleeping 3 3 -  Tired, decreased energy 3 3 -  Change in appetite 3 1 -  Feeling bad or failure about yourself  0 1 -  Trouble concentrating 2 2 -  Moving slowly or fidgety/restless 3 1 -  Suicidal thoughts 0 0 -  PHQ-9 Score 16 12 -  Difficult doing work/chores Somewhat difficult Somewhat difficult -     Fall Risk: Fall Risk  09/02/2018 05/14/2018 04/11/2018  Falls in the past year? No No No    Functional Status Survey: Is the patient deaf or have difficulty hearing?: Yes(Can't hear out of left ear-thinks it is infected) Does the patient have difficulty seeing, even when wearing glasses/contacts?: No Does the patient have difficulty concentrating, remembering, or making decisions?: Yes(concentrating) Does the patient have difficulty walking or climbing stairs?: Yes(has sciatica) Does the patient have difficulty dressing or bathing?: No Does the patient have difficulty doing errands alone such as visiting a doctor's office or shopping?: Yes(does not drive)   Assessment & Plan  1. Well woman exam   2. Need for immunization against influenza  - Flu Vaccine QUAD 6+ mos PF IM (Fluarix Quad PF)  3. Mucopurulent chronic bronchitis (HCC)  - albuterol (PROVENTIL HFA;VENTOLIN HFA) 108 (90 Base) MCG/ACT inhaler; Inhale 2 puffs into the lungs every 6 (six) hours as needed for wheezing or shortness of breath.  Dispense: 1 Inhaler; Refill: 0 - umeclidinium-vilanterol (ANORO ELLIPTA) 62.5-25 MCG/INH AEPB; Inhale 1 puff into the lungs daily.  Dispense: 60 each; Refill: 5  4. Overweight   Discussed ways to continue to lose  weight.   5. Chronic constipation  She  wants to go down to one cap daily because otherwise she has diarrhea  - lubiprostone (AMITIZA) 24 MCG capsule; Take 1 capsule (24 mcg total) by mouth daily with breakfast.  Dispense: 30 capsule; Refill: 5  6. Recurrent UTI  Seeing Urologist   7. Bipolar I disorder, most recent episode (or current) manic ([place])  Keep follow up with NP [place], under the care of Dr. Holley Raring   8. Essential hypertension  - CBC with Differential/Platelet - COMPLETE METABOLIC PANEL WITH GFR - hydrochlorothiazide (HYDRODIURIL) 12.5 MG tablet; Take 1 tablet (12.5 mg total) by mouth daily.  Dispense: 30 tablet; Refill: 5  9. Pre-diabetes   10. Hypothyroidism due to medication  - TSH  11. Hyperglycemia  - Hemoglobin A1c  12. Mixed hyperlipidemia  - Lipid panel  13. Cervical cancer screening  - Cytology - PAP  14. GERD without esophagitis  - pantoprazole (PROTONIX) 40 MG tablet; Take 1 tablet (40 mg total) by mouth daily at 6 (six) AM.  Dispense: 30 tablet; Refill: 5   -USPSTF grade A and B recommendations reviewed with patient; age-appropriate recommendations, preventive care, screening tests, etc discussed and encouraged; healthy living encouraged; see AVS for patient education given to patient -Discussed importance of 150 minutes of physical activity weekly, eat two servings of fish weekly, eat one serving of tree nuts ( cashews, pistachios, pecans, almonds.Marland Kitchen) every other day, eat 6 servings of fruit/vegetables daily and drink plenty of water and avoid sweet beverages.

## 2018-09-02 NOTE — Patient Instructions (Signed)
Preventive Care 40-64 Years, Female Preventive care refers to lifestyle choices and visits with your health care provider that can promote health and wellness. What does preventive care include?  A yearly physical exam. This is also called an annual well check.  Dental exams once or twice a year.  Routine eye exams. Ask your health care provider how often you should have your eyes checked.  Personal lifestyle choices, including: ? Daily care of your teeth and gums. ? Regular physical activity. ? Eating a healthy diet. ? Avoiding tobacco and drug use. ? Limiting alcohol use. ? Practicing safe sex. ? Taking low-dose aspirin daily starting at age 58. ? Taking vitamin and mineral supplements as recommended by your health care provider. What happens during an annual well check? The services and screenings done by your health care provider during your annual well check will depend on your age, overall health, lifestyle risk factors, and family history of disease. Counseling Your health care provider may ask you questions about your:  Alcohol use.  Tobacco use.  Drug use.  Emotional well-being.  Home and relationship well-being.  Sexual activity.  Eating habits.  Work and work Statistician.  Method of birth control.  Menstrual cycle.  Pregnancy history.  Screening You may have the following tests or measurements:  Height, weight, and BMI.  Blood pressure.  Lipid and cholesterol levels. These may be checked every 5 years, or more frequently if you are over 81 years old.  Skin check.  Lung cancer screening. You may have this screening every year starting at age 78 if you have a 30-pack-year history of smoking and currently smoke or have quit within the past 15 years.  Fecal occult blood test (FOBT) of the stool. You may have this test every year starting at age 65.  Flexible sigmoidoscopy or colonoscopy. You may have a sigmoidoscopy every 5 years or a colonoscopy  every 10 years starting at age 30.  Hepatitis C blood test.  Hepatitis B blood test.  Sexually transmitted disease (STD) testing.  Diabetes screening. This is done by checking your blood sugar (glucose) after you have not eaten for a while (fasting). You may have this done every 1-3 years.  Mammogram. This may be done every 1-2 years. Talk to your health care provider about when you should start having regular mammograms. This may depend on whether you have a family history of breast cancer.  BRCA-related cancer screening. This may be done if you have a family history of breast, ovarian, tubal, or peritoneal cancers.  Pelvic exam and Pap test. This may be done every 3 years starting at age 80. Starting at age 36, this may be done every 5 years if you have a Pap test in combination with an HPV test.  Bone density scan. This is done to screen for osteoporosis. You may have this scan if you are at high risk for osteoporosis.  Discuss your test results, treatment options, and if necessary, the need for more tests with your health care provider. Vaccines Your health care provider may recommend certain vaccines, such as:  Influenza vaccine. This is recommended every year.  Tetanus, diphtheria, and acellular pertussis (Tdap, Td) vaccine. You may need a Td booster every 10 years.  Varicella vaccine. You may need this if you have not been vaccinated.  Zoster vaccine. You may need this after age 5.  Measles, mumps, and rubella (MMR) vaccine. You may need at least one dose of MMR if you were born in  1956 or later. You may also need a second dose.  Pneumococcal 13-valent conjugate (PCV13) vaccine. You may need this if you have certain conditions and were not previously vaccinated.  Pneumococcal polysaccharide (PPSV23) vaccine. You may need one or two doses if you smoke cigarettes or if you have certain conditions.  Meningococcal vaccine. You may need this if you have certain  conditions.  Hepatitis A vaccine. You may need this if you have certain conditions or if you travel or work in places where you may be exposed to hepatitis A.  Hepatitis B vaccine. You may need this if you have certain conditions or if you travel or work in places where you may be exposed to hepatitis B.  Haemophilus influenzae type b (Hib) vaccine. You may need this if you have certain conditions.  Talk to your health care provider about which screenings and vaccines you need and how often you need them. This information is not intended to replace advice given to you by your health care provider. Make sure you discuss any questions you have with your health care provider. Document Released: 01/07/2016 Document Revised: 08/30/2016 Document Reviewed: 10/12/2015 Elsevier Interactive Patient Education  2018 Elsevier Inc.  

## 2018-09-03 LAB — CBC WITH DIFFERENTIAL/PLATELET
BASOS ABS: 58 {cells}/uL (ref 0–200)
Basophils Relative: 0.6 %
Eosinophils Absolute: 67 cells/uL (ref 15–500)
Eosinophils Relative: 0.7 %
HEMATOCRIT: 42.2 % (ref 35.0–45.0)
HEMOGLOBIN: 14.4 g/dL (ref 11.7–15.5)
LYMPHS ABS: 2285 {cells}/uL (ref 850–3900)
MCH: 30.4 pg (ref 27.0–33.0)
MCHC: 34.1 g/dL (ref 32.0–36.0)
MCV: 89 fL (ref 80.0–100.0)
MONOS PCT: 6.2 %
MPV: 10.1 fL (ref 7.5–12.5)
NEUTROS ABS: 6595 {cells}/uL (ref 1500–7800)
NEUTROS PCT: 68.7 %
Platelets: 176 10*3/uL (ref 140–400)
RBC: 4.74 10*6/uL (ref 3.80–5.10)
RDW: 14.1 % (ref 11.0–15.0)
Total Lymphocyte: 23.8 %
WBC mixed population: 595 cells/uL (ref 200–950)
WBC: 9.6 10*3/uL (ref 3.8–10.8)

## 2018-09-03 LAB — COMPLETE METABOLIC PANEL WITH GFR
AG Ratio: 1.7 (calc) (ref 1.0–2.5)
ALBUMIN MSPROF: 4.4 g/dL (ref 3.6–5.1)
ALKALINE PHOSPHATASE (APISO): 95 U/L (ref 33–115)
ALT: 10 U/L (ref 6–29)
AST: 14 U/L (ref 10–35)
BILIRUBIN TOTAL: 0.4 mg/dL (ref 0.2–1.2)
BUN / CREAT RATIO: 12 (calc) (ref 6–22)
BUN: 14 mg/dL (ref 7–25)
CHLORIDE: 100 mmol/L (ref 98–110)
CO2: 26 mmol/L (ref 20–32)
Calcium: 9.6 mg/dL (ref 8.6–10.2)
Creat: 1.13 mg/dL — ABNORMAL HIGH (ref 0.50–1.10)
GFR, Est African American: 68 mL/min/{1.73_m2} (ref >=60)
GFR, Est Non African American: 59 mL/min/{1.73_m2} — ABNORMAL LOW (ref >=60)
GLOBULIN: 2.6 g/dL (ref 1.9–3.7)
Glucose, Bld: 94 mg/dL (ref 65–99)
POTASSIUM: 3.8 mmol/L (ref 3.5–5.3)
SODIUM: 137 mmol/L (ref 135–146)
Total Protein: 7 g/dL (ref 6.1–8.1)

## 2018-09-03 LAB — HEMOGLOBIN A1C
HEMOGLOBIN A1C: 6.3 %{Hb} — AB (ref <5.7)
Mean Plasma Glucose: 134 (calc)
eAG (mmol/L): 7.4 (calc)

## 2018-09-03 LAB — LIPID PANEL
CHOL/HDL RATIO: 3.8 (calc) (ref <5.0)
Cholesterol: 152 mg/dL (ref <200)
HDL: 40 mg/dL — AB (ref >50)
LDL Cholesterol (Calc): 88 mg/dL (calc)
NON-HDL CHOLESTEROL (CALC): 112 mg/dL (ref <130)
Triglycerides: 142 mg/dL (ref <150)

## 2018-09-03 LAB — TSH: TSH: 0.84 m[IU]/L

## 2018-09-04 LAB — CYTOLOGY - PAP
Diagnosis: UNDETERMINED — AB
HPV: NOT DETECTED

## 2018-09-05 ENCOUNTER — Encounter: Payer: Self-pay | Admitting: Family Medicine

## 2018-09-05 DIAGNOSIS — R87610 Atypical squamous cells of undetermined significance on cytologic smear of cervix (ASC-US): Secondary | ICD-10-CM | POA: Insufficient documentation

## 2018-09-19 ENCOUNTER — Ambulatory Visit
Admission: RE | Admit: 2018-09-19 | Discharge: 2018-09-19 | Disposition: A | Discharge status: Home / Self Care | Payer: Medicare Other | Source: Ambulatory Visit | Attending: Urology | Admitting: Urology

## 2018-09-19 DIAGNOSIS — N132 Hydronephrosis with renal and ureteral calculous obstruction: Secondary | ICD-10-CM | POA: Diagnosis not present

## 2018-09-19 DIAGNOSIS — N200 Calculus of kidney: Secondary | ICD-10-CM | POA: Diagnosis present

## 2018-09-19 DIAGNOSIS — N136 Pyonephrosis: Secondary | ICD-10-CM | POA: Insufficient documentation

## 2018-09-19 DIAGNOSIS — N390 Urinary tract infection, site not specified: Secondary | ICD-10-CM | POA: Diagnosis present

## 2018-09-22 ENCOUNTER — Other Ambulatory Visit: Payer: Self-pay | Admitting: Family Medicine

## 2018-09-22 DIAGNOSIS — J411 Mucopurulent chronic bronchitis: Secondary | ICD-10-CM

## 2018-09-23 ENCOUNTER — Telehealth: Payer: Self-pay

## 2018-09-23 NOTE — Telephone Encounter (Signed)
Called to inform pt, no answer

## 2018-09-23 NOTE — Telephone Encounter (Signed)
-----   Message from Bjorn Loser, MD sent at 09/23/2018  1:01 PM EDT ----- Patient has left renal stone that may need lithotripsy or even a percutaneous procedure.  Have patient please see 1 of the 3 full-time urologist thank you    ----- Message ----- From: Garnette Gunner, CMA Sent: 09/20/2018   8:42 AM EDT To: Bjorn Loser, MD    ----- Message ----- From: Interface, Rad Results In Sent: 09/19/2018   5:31 PM EDT To: [facility]

## 2018-09-25 ENCOUNTER — Encounter: Payer: Self-pay | Admitting: Family Medicine

## 2018-09-25 NOTE — Telephone Encounter (Signed)
Unable to reach letter sent to patient

## 2018-09-30 ENCOUNTER — Encounter: Payer: Self-pay | Admitting: Urology

## 2018-09-30 ENCOUNTER — Ambulatory Visit: Payer: Medicare Other | Admitting: Urology

## 2018-09-30 VITALS — BP 111/74 | HR 92 | Ht 59.0 in | Wt 149.6 lb

## 2018-09-30 DIAGNOSIS — N390 Urinary tract infection, site not specified: Secondary | ICD-10-CM | POA: Diagnosis not present

## 2018-09-30 LAB — MICROSCOPIC EXAMINATION: Epithelial Cells (non renal): NONE SEEN /hpf (ref 0–10)

## 2018-09-30 LAB — URINALYSIS, COMPLETE
Bilirubin, UA: NEGATIVE
GLUCOSE, UA: NEGATIVE
Ketones, UA: NEGATIVE
Nitrite, UA: NEGATIVE
Protein, UA: NEGATIVE
Specific Gravity, UA: <1.005 — ABNORMAL LOW (ref 1.005–1.030)
Urobilinogen, Ur: 0.2 mg/dL (ref 0.2–1.0)
pH, UA: 6 (ref 5.0–7.5)

## 2018-09-30 MED ORDER — LIDOCAINE HCL URETHRAL/MUCOSAL 2 % EX GEL
1.0000 "application " | Freq: Once | CUTANEOUS | Status: AC
  Administered 2018-09-30: 1 via URETHRAL

## 2018-09-30 NOTE — Progress Notes (Signed)
09/30/2018 1:35 PM   April Luna 1971-02-09 [id number]  Referring provider: Steele Sizer, MD [address] [place], [place] [address]  Chief Complaint  Patient presents with  . Cysto    HPI: Consulted to assist the patient recurrent bladder infections. The patient describes incontinence burning cloudy urine that respond favorably to antibiotics. She was nonspecific but said her infections are very difficult to clear   At baseline she leaks with coughing sneezing but not bending lifting. Sometimes she has urge incontinence if she holds it too long. She does not leak when she is asleep. She wears 3 small pads a day  On review the medical record she has had recent positive urine cultures.She had a CT scan in November 2015 with multiple left renal stones and one stone 1.5 cm in size. She has left hydroureter noted without evidence of obstructing stone.  The patient has recurrent bladder infections. She had significant left renal stone burden in 2015. The role of a cystoscopy pelvic examination for her incontinence and a repeat CT scan was described. Urine was sent for culture.Likely I will place her on prophylaxis and see if this controls her infections and down regulate some of her incontinence. Treatment regarding kidney stone will depend upon x-ray findings.  She thought she was infected again and we sent the urine for culture.  Last culture was positive..  It is not appear that she ever had her stone study CT scan ordered.    Last urine culture positive treated with ciprofloxacin.   Patient has on CT scan moderate renal atrophy.  She has a very impressive 1.5 cm egg shaped stone at the left ureteropelvic junction.  She has additional smaller stones in the left kidney.  We called but could not reach her by telephone.  Today On pelvic examination the patient had mild hypermobility of the bladder neck. Clinically not infected.  She has nonspecific  right flank pain and left flank pain for 2 years.  I did not think she was having acute colic based upon today's history  She is on daily trimethoprim.  Clinically not infected today but urine sent for culture  Cystoscopy: Utilizing sterile technique patient underwent flexible cystoscopy.  Bladder mucosa and trigone were normal.  There is no foreign body or carcinoma.  There is no subacute cystitis.    PMH: Past Medical History:  Diagnosis Date  . Allergy    pollen  . Anxiety   . Bipolar 1 disorder ([place])   . Depression   . GERD (gastroesophageal reflux disease)   . Hyperlipidemia   . Hypertension   . Hypothyroidism     Surgical History: Past Surgical History:  Procedure Laterality Date  . CESAREAN SECTION    . MOUTH SURGERY      Home Medications:  Allergies as of 09/30/2018      Reactions   Penicillins Other (See Comments)   GI distress   Perphenazine Other (See Comments)   Tremors, muscle weakness   Sulfa Antibiotics Other (See Comments)   GI distress   Abilify [aripiprazole] Rash      Medication List        Accurate as of 09/30/18  1:35 PM. Always use your most recent med list.          amantadine 100 MG capsule Commonly known as:  SYMMETREL Take 100 mg by mouth 2 (two) times daily.   buPROPion 150 MG 24 hr tablet Commonly known as:  WELLBUTRIN XL Take 150  mg by mouth daily.   busPIRone 10 MG tablet Commonly known as:  BUSPAR Take 15 mg by mouth 2 (two) times daily.   ciprofloxacin 250 MG tablet Commonly known as:  CIPRO Take 250 mg by mouth 2 (two) times daily.   DULCOLAX STOOL SOFTENER PO Take by mouth as needed.   gabapentin 300 MG capsule Commonly known as:  NEURONTIN Take 300 mg by mouth 2 (two) times daily.   hydrochlorothiazide 12.5 MG tablet Commonly known as:  HYDRODIURIL Take 1 tablet (12.5 mg total) by mouth daily.   lactulose 10 GM/15ML solution Commonly known as:  CHRONULAC Take 15 mLs (10 g total) by mouth at bedtime.     levothyroxine 75 MCG tablet Commonly known as:  SYNTHROID, LEVOTHROID Take 1 tablet (75 mcg total) by mouth daily at 6 (six) AM.   LORazepam 1 MG tablet Commonly known as:  ATIVAN Take 1 tablet (1 mg total) by mouth every 8 (eight) hours as needed for anxiety.   lubiprostone 24 MCG capsule Commonly known as:  AMITIZA Take 1 capsule (24 mcg total) by mouth daily with breakfast.   Nicotine 21-14-7 MG/24HR Kit Place 1 kit onto the skin daily.   paliperidone 156 MG/ML Susp injection Commonly known as:  INVEGA SUSTENNA Inject 1 mL (156 mg total) into the muscle once.   pantoprazole 40 MG tablet Commonly known as:  PROTONIX Take 1 tablet (40 mg total) by mouth daily at 6 (six) AM.   prazosin 2 MG capsule Commonly known as:  MINIPRESS Take 2 mg by mouth every morning.   PROAIR HFA 108 (90 Base) MCG/ACT inhaler Generic drug:  albuterol TAKE 2 PUFFS BY MOUTH EVERY 6 HOURS AS NEEDED FOR WHEEZE OR SHORTNESS OF BREATH   rosuvastatin 20 MG tablet Commonly known as:  CRESTOR Take 1 tablet (20 mg total) by mouth daily. In place of pravastatin   sertraline 100 MG tablet Commonly known as:  ZOLOFT Take 100 mg by mouth 2 (two) times daily.   trimethoprim 100 MG tablet Commonly known as:  TRIMPEX Take 1 tablet (100 mg total) by mouth daily.   umeclidinium-vilanterol 62.5-25 MCG/INH Aepb Commonly known as:  ANORO ELLIPTA Inhale 1 puff into the lungs daily.   vitamin B-12 1000 MCG tablet Commonly known as:  CYANOCOBALAMIN Take by mouth.   Vitamin D (Ergocalciferol) 50000 units Caps capsule Commonly known as:  DRISDOL Take 50,000 Units by mouth every 7 (seven) days.       Allergies:  Allergies  Allergen Reactions  . Penicillins Other (See Comments)    GI distress  . Perphenazine Other (See Comments)    Tremors, muscle weakness  . Sulfa Antibiotics Other (See Comments)    GI distress   . Abilify [Aripiprazole] Rash    Family History: Family History  Problem  Relation Age of Onset  . Depression Mother   . Anxiety disorder Mother   . Diabetes Mother   . Hypertension Mother   . Hyperlipidemia Mother   . Cancer Mother   . Cancer Father        colon cancer  . Depression Brother   . Anxiety disorder Brother     Social History:  reports that she has been smoking cigarettes. She started smoking about 33 years ago. She has a 16.50 pack-year smoking history. She has never used smokeless tobacco. She reports that she drank about 12.0 standard drinks of alcohol per week. She reports that she has current or past drug history. Drug: Marijuana.  ROS:                                        Physical Exam: BP 111/74 (BP Location: Left Arm, Patient Position: Sitting, Cuff Size: Normal)   Pulse 92   Ht _0  (1.499 m)   Wt 149 lb 9.6 oz (67.9 kg)   BMI 30.22 kg/m   Constitutional:  Alert and oriented, No acute distress.   Laboratory Data: Lab Results  Component Value Date   WBC 9.6 09/02/2018   HGB 14.4 09/02/2018   HCT 42.2 09/02/2018   MCV 89.0 09/02/2018   PLT 176 09/02/2018    Lab Results  Component Value Date   CREATININE 1.13 (H) 09/02/2018    No results found for: PSA  No results found for: TESTOSTERONE  Lab Results  Component Value Date   HGBA1C 6.3 (H) 09/02/2018    Urinalysis    Component Value Date/Time   COLORURINE YELLOW (A) 01/24/2018 0937   APPEARANCEUR Cloudy (A) 08/12/2018 1056   LABSPEC 1.013 01/24/2018 0937   LABSPEC 1.009 04/07/2015 1648   PHURINE 6.0 01/24/2018 0937   GLUCOSEU Negative 08/12/2018 1056   GLUCOSEU Negative 04/07/2015 1648   HGBUR MODERATE (A) 01/24/2018 0937   BILIRUBINUR Negative 08/12/2018 1056   BILIRUBINUR Negative 04/07/2015 1648   KETONESUR NEGATIVE 01/24/2018 0937   PROTEINUR 2+ (A) 08/12/2018 1056   PROTEINUR 30 (A) 01/24/2018 0937   UROBILINOGEN 0.2 04/11/2018 1042   NITRITE Positive (A) 08/12/2018 1056   NITRITE NEGATIVE 01/24/2018 0937    LEUKOCYTESUR 1+ (A) 08/12/2018 1056   LEUKOCYTESUR Trace 04/07/2015 1648    Pertinent Imaging:   Assessment & Plan: Picture was drawn.  The patient may be best treated with a percutaneous procedure with removal of the majority and perhaps all of the stones.  I will seek a second opinion.  The stone burden was quite significant at the ureteropelvic junction for lithotripsy.  Patient would want to 10 towards the most definitive treatment.  She will follow-up with Dr. Diamantina Providence to discuss options.  Indications to go the emergency room were discussed  There are no diagnoses linked to this encounter.  No follow-ups on file.  Reece Packer, MD  [facility] [address] [place], [place] [address] [phone]

## 2018-10-03 LAB — CULTURE, URINE COMPREHENSIVE

## 2018-10-10 ENCOUNTER — Ambulatory Visit (INDEPENDENT_AMBULATORY_CARE_PROVIDER_SITE_OTHER): Payer: Medicare Other | Admitting: Family Medicine

## 2018-10-10 VITALS — BP 117/78 | HR 76 | Ht 59.0 in | Wt 149.0 lb

## 2018-10-10 DIAGNOSIS — N390 Urinary tract infection, site not specified: Secondary | ICD-10-CM | POA: Diagnosis not present

## 2018-10-10 MED ORDER — NITROFURANTOIN MONOHYD MACRO 100 MG PO CAPS: 100.0000 mg | ORAL_CAPSULE | Freq: Two times a day (BID) | ORAL | 0 refills | Status: DC

## 2018-10-10 NOTE — Addendum Note (Signed)
Addended by: Kyra Manges on: 10/10/2018 11:25 AM   Modules accepted: Level of Service

## 2018-10-10 NOTE — Progress Notes (Signed)
Patient presents today with urinary frequency and dysuria and nausea. Her symptoms began 3 days ago after having a Cystoscopy. She has been on ABX (Cipro). She has not had any Urological surgeries in the last 30 days. A urine was collected for UA, UCX. Larene Beach reviewed the [place] from 09/30/18 and Macrobid was sent to pharmacy.

## 2018-10-11 LAB — URINALYSIS, COMPLETE
Bilirubin, UA: NEGATIVE
GLUCOSE, UA: NEGATIVE
KETONES UA: NEGATIVE
NITRITE UA: POSITIVE — AB
SPEC GRAV UA: 1.025 (ref 1.005–1.030)
Urobilinogen, Ur: 0.2 mg/dL (ref 0.2–1.0)
pH, UA: 6 (ref 5.0–7.5)

## 2018-10-11 LAB — MICROSCOPIC EXAMINATION

## 2018-10-14 ENCOUNTER — Encounter: Payer: Self-pay | Admitting: Urology

## 2018-10-14 ENCOUNTER — Encounter: Payer: Medicare Other | Admitting: Urology

## 2018-10-14 LAB — CULTURE, URINE COMPREHENSIVE

## 2018-10-15 ENCOUNTER — Telehealth: Payer: Self-pay | Admitting: Family Medicine

## 2018-10-15 NOTE — Telephone Encounter (Signed)
-----   Message from Nori Riis, PA-C sent at 10/14/2018  7:59 AM EDT ----- Please let Mrs. Cessna know that her urine culture was positive.  The organism is sensitive to the [facility], so she should complete that antibiotic.

## 2018-10-15 NOTE — Telephone Encounter (Signed)
Patient notified and voiced understanding.

## 2018-10-17 NOTE — Progress Notes (Signed)
The patient left without being seen.

## 2018-11-04 ENCOUNTER — Ambulatory Visit (INDEPENDENT_AMBULATORY_CARE_PROVIDER_SITE_OTHER): Payer: Medicare Other | Admitting: Urology

## 2018-11-04 ENCOUNTER — Other Ambulatory Visit: Payer: Self-pay | Admitting: Radiology

## 2018-11-04 ENCOUNTER — Other Ambulatory Visit: Payer: Self-pay

## 2018-11-04 ENCOUNTER — Encounter: Payer: Self-pay | Admitting: Urology

## 2018-11-04 ENCOUNTER — Telehealth: Payer: Self-pay | Admitting: Radiology

## 2018-11-04 VITALS — BP 110/77 | HR 114 | Ht 59.0 in | Wt 151.2 lb

## 2018-11-04 DIAGNOSIS — N200 Calculus of kidney: Secondary | ICD-10-CM

## 2018-11-04 DIAGNOSIS — N390 Urinary tract infection, site not specified: Secondary | ICD-10-CM | POA: Diagnosis not present

## 2018-11-04 LAB — URINALYSIS, COMPLETE
BILIRUBIN UA: NEGATIVE
Glucose, UA: NEGATIVE
KETONES UA: NEGATIVE
NITRITE UA: POSITIVE — AB
PH UA: 6 (ref 5.0–7.5)
SPEC GRAV UA: 1.015 (ref 1.005–1.030)
UUROB: 0.2 mg/dL (ref 0.2–1.0)

## 2018-11-04 LAB — MICROSCOPIC EXAMINATION

## 2018-11-04 MED ORDER — CIPROFLOXACIN HCL 500 MG PO TABS: 500.0000 mg | ORAL_TABLET | Freq: Two times a day (BID) | ORAL | 0 refills | Status: DC

## 2018-11-04 NOTE — Progress Notes (Signed)
11/04/2018 4:56 PM   April Luna [birth year] [id number]  Reason for visit: Follow up left UPJ stone, 1.6 cm  HPI: I saw April Luna today for the first time in clinic for evaluation of a large left 1.6 cm UPJ stone.  She was previously followed by Dr. Matilde Sprang for recurrent UTIs.  He ordered a CT renal stone study, which was performed 09/19/2018 and showed left hydronephrosis, left renal parenchymal atrophy, and a left 1.6 cm left UPJ calculus.  She is also been treated for recurrent UTIs, and last culture 10/10/2018 grew greater than 100,000 units of E. coli and was treated with Macrobid.  She denies any fevers or chills.  She does have intermittent left-sided flank pain.  She denies any dysuria, urgency, or frequency.  She is a heavy smoker.   ROS: Please see flowsheet from today's date for complete review of systems.  Physical Exam: BP 110/77   Pulse (!) 114   Ht '4\' 11"'  (1.499 m)   Wt 151 lb 3.2 oz (68.6 kg)   BMI 30.54 kg/m    Constitutional:  Alert and oriented, No acute distress.  Nontoxic-appearing Respiratory: Normal respiratory effort, no increased work of breathing. GI: Abdomen is soft, nontender, nondistended, no abdominal masses GU: No CVA tenderness Skin: No rashes, bruises or suspicious lesions. Neurologic: Grossly intact, no focal deficits, moving all 4 extremities. Psychiatric: Normal mood and affect  Laboratory Data: Creatinine 1.13, eGFR 59  Urinalysis today 11-30 WBCs, 3-10 RBCs, many bacteria, nitrite positive  Pertinent Imaging:  I have personally reviewed the CT renal stone study dated 09/19/2018.  Left 1.6 cm UPJ stone with hydronephrosis and renal atrophy  Assessment & Plan:   In summary, April Luna is a 47 year old female with recurrent urinary tract infections, likely secondary to long-term left-sided chronic obstruction from a 1.6 cm left UPJ stone.  She is currently asymptomatic and afebrile at this time.   We discussed various  treatment options for urolithiasis including observation with or without medical expulsive therapy, shockwave lithotripsy (SWL), ureteroscopy and laser lithotripsy with stent placement, and percutaneous nephrolithotomy.  We discussed that management is based on stone size, location, density, patient co-morbidities, and patient preference.   Stones <53m in size have a >80% spontaneous passage rate. Data surrounding the use of tamsulosin for medical expulsive therapy is controversial, but meta analyses suggests it is most efficacious for distal stones between 5-185min size. Possible side effects include dizziness/lightheadedness, and retrograde ejaculation.  SWL has a lower stone free rate in a single procedure, but also a lower complication rate compared to ureteroscopy and avoids a stent and associated stent related symptoms. Possible complications include renal hematoma, steinstrasse, and need for additional treatment.  Ureteroscopy with laser lithotripsy and stent placement has a higher stone free rate than SWL in a single procedure, however increased complication rate including possible infection, ureteral injury, bleeding, and stent related morbidity. Common stent related symptoms include dysuria, urgency/frequency, and flank pain.  PCNL is the favored treatment for stones >2cm. It involves a small incision in the flank, with complete fragmentation of stones and removal. It has the highest stone free rate, but also the highest complication rate. Possible complications include bleeding, infection/sepsis, injury to surrounding organs including the pleura, and collecting system injury.   After an extensive discussion of the risks and benefits of the above treatment options, the patient would like to proceed with left ureteroscopy, laser lithotripsy, stent placement.  She does have a larger stone burden, but I  feel this is amenable to treatment with ureteroscopy.  Will prescribe 5 days treatment Cipro  to attempt to sterilize the urine.  I discussed with her that if her urine the day of surgery appears to still have bacteria, we will have to place a temporary stent for drainage with plan for follow-up definitive management with staged procedure when infection is cleared.  I also counseled her to return immediately if she develops fever over 101 degrees or signs of systemic infection.  We have added her onto the schedule for this Friday 11/15.  Billey Co, [facility] [address], [place], [place] [address] [phone]

## 2018-11-04 NOTE — Telephone Encounter (Signed)
Patient was given the [facility] Information form below as well as the Instructions for Pre-Admission Testing form & a map of [facility].   [place], [place], [place] [address] Telephone: [phone] Fax: [phone]   Thank you for choosing [facility] for your upcoming surgery!  We are always here to assist in your urological needs.  Please read the following information with specific details for your upcoming appointments related to your surgery. Please contact [place] [facility] at [phone] Option 3 with any questions.  The Name of Your Surgery: Left ureteroscopy, laser lithotripsy, left ureteral stent placement Your Surgery Date: 11/08/2018 Your Surgeon: Nickolas Madrid  Please call Same Day Surgery at [phone] between the hours of 1pm-3pm one day prior to your surgery. They will inform you of the time to arrive at Same Day Surgery which is located on the second floor of the [facility].   Please refer to the attached letter regarding instructions for Pre-Admission Testing. You will receive a call from the [place] office regarding your appointment with them.  The Pre-Admission Testing office is located at [place], on the first floor of the [facility] in [place] (office is to the right as you enter through the [employer] of the [facility]). Please have all medications you are currently taking and your insurance card available.   Patient was advised to have nothing to eat or drink after midnight the night prior to surgery except that she may have only water until 2 hours before surgery with nothing to drink within 2 hours of surgery.  The patient states she currently takes no blood thinners. Pre-admission testing appointment has been scheduled on 12/06/2018 at 2:45. Patient's questions were answered  and she expressed understanding of these instructions.

## 2018-11-06 ENCOUNTER — Encounter
Admission: RE | Admit: 2018-11-06 | Discharge: 2018-11-06 | Disposition: A | Discharge status: Home / Self Care | Payer: Medicare Other | Source: Ambulatory Visit | Attending: Urology | Admitting: Urology

## 2018-11-06 ENCOUNTER — Other Ambulatory Visit: Payer: Self-pay

## 2018-11-06 HISTORY — DX: Personal history of urinary calculi: Z87.442

## 2018-11-06 HISTORY — DX: Chronic obstructive pulmonary disease, unspecified: J44.9

## 2018-11-06 HISTORY — DX: Family history of other specified conditions: Z84.89

## 2018-11-06 HISTORY — DX: Pneumonia, unspecified organism: J18.9

## 2018-11-06 LAB — CULTURE, URINE COMPREHENSIVE

## 2018-11-06 NOTE — Patient Instructions (Signed)
Your procedure is scheduled on: Friday, November 08, 2018 Report to Day Surgery on the 2nd floor of the [facility]. To find out your arrival time, please call [phone] between 1PM - 3PM on: Thursday, November 07, 2018  REMEMBER: Instructions that are not followed completely may result in serious medical risk, up to and including death; or upon the discretion of your surgeon and anesthesiologist your surgery may need to be rescheduled.  Do not eat food after midnight the night before surgery.  No gum chewing, lozengers or hard candies.  You may however, drink CLEAR liquids up to 2 hours before you are scheduled to arrive for your surgery. Do not drink anything within 2 hours of the start of your surgery.  Clear liquids include: - water  - apple juice without pulp - gatorade - black coffee or tea (Do NOT add milk or creamers to the coffee or tea) Do NOT drink anything that is not on this list.  No Alcohol for 24 hours before or after surgery.  No Smoking including e-cigarettes for 24 hours prior to surgery.  No chewable tobacco products for at least 6 hours prior to surgery.  No nicotine patches on the day of surgery.  On the morning of surgery brush your teeth with toothpaste and water, you may rinse your mouth with mouthwash if you wish. Do not swallow any toothpaste or mouthwash.  Notify your doctor if there is any change in your medical condition (cold, fever, infection).  Do not wear jewelry, make-up, hairpins, clips or nail polish.  Do not wear lotions, powders, or perfumes.   Do not shave 48 hours prior to surgery.   Contacts and dentures may not be worn into surgery.  Do not bring valuables to the hospital, including drivers license, insurance or credit cards.  [facility] is not responsible for any belongings or valuables.   TAKE THESE MEDICATIONS THE MORNING OF SURGERY:  1.  Amantadine 2.  Bupropion 3.  Gabapentin 4.  Levothyroxine 5.  Pantoprazole   (take one the night before and one on the morning of surgery - helps to prevent nausea after surgery.) 6.  Prazosin 7.  proair inhaler 8.  Sertraline 9.  anoro ellipta inhaler  Use inhalers on the day of surgery and bring to the hospital.  NOW!  Stop Anti-inflammatories (NSAIDS) such as Advil, Aleve, Ibuprofen, Motrin, Naproxen, Naprosyn and Aspirin based products such as Excedrin, Goodys Powder, BC Powder. (May take Tylenol or Acetaminophen if needed.)  NOW!  Stop ANY OVER THE COUNTER supplements until after surgery.  Wear comfortable clothing (specific to your surgery type) to the hospital.  If you are being discharged the day of surgery, you will not be allowed to drive home. You will need a responsible adult to drive you home and stay with you that night.   If you are taking public transportation, you will need to have a responsible adult with you. Please confirm with your physician that it is acceptable to use public transportation.   Please call [phone] if you have any questions about these instructions.

## 2018-11-07 ENCOUNTER — Other Ambulatory Visit: Payer: Self-pay | Admitting: Radiology

## 2018-11-07 MED ORDER — CIPROFLOXACIN IN D5W 400 MG/200ML IV SOLN
400.0000 mg | INTRAVENOUS | Status: AC
  Administered 2018-11-08: 400 mg via INTRAVENOUS

## 2018-11-08 ENCOUNTER — Encounter: Payer: Self-pay | Admitting: Anesthesiology

## 2018-11-08 ENCOUNTER — Ambulatory Visit: Payer: Medicare Other | Admitting: Anesthesiology

## 2018-11-08 ENCOUNTER — Encounter
Admission: RE | Disposition: A | Discharge status: Home / Self Care | Payer: Self-pay | Source: Ambulatory Visit | Attending: Urology

## 2018-11-08 ENCOUNTER — Other Ambulatory Visit: Payer: Self-pay

## 2018-11-08 ENCOUNTER — Telehealth: Payer: Self-pay | Admitting: Urology

## 2018-11-08 ENCOUNTER — Ambulatory Visit
Admission: RE | Admit: 2018-11-08 | Discharge: 2018-11-08 | Disposition: A | Discharge status: Home / Self Care | Payer: Medicare Other | Source: Ambulatory Visit | Attending: Urology | Admitting: Urology

## 2018-11-08 DIAGNOSIS — E785 Hyperlipidemia, unspecified: Secondary | ICD-10-CM | POA: Diagnosis not present

## 2018-11-08 DIAGNOSIS — F17200 Nicotine dependence, unspecified, uncomplicated: Secondary | ICD-10-CM | POA: Diagnosis not present

## 2018-11-08 DIAGNOSIS — J449 Chronic obstructive pulmonary disease, unspecified: Secondary | ICD-10-CM | POA: Diagnosis not present

## 2018-11-08 DIAGNOSIS — N201 Calculus of ureter: Secondary | ICD-10-CM | POA: Diagnosis not present

## 2018-11-08 DIAGNOSIS — E039 Hypothyroidism, unspecified: Secondary | ICD-10-CM | POA: Diagnosis not present

## 2018-11-08 DIAGNOSIS — K219 Gastro-esophageal reflux disease without esophagitis: Secondary | ICD-10-CM | POA: Insufficient documentation

## 2018-11-08 DIAGNOSIS — I10 Essential (primary) hypertension: Secondary | ICD-10-CM | POA: Insufficient documentation

## 2018-11-08 DIAGNOSIS — Z79899 Other long term (current) drug therapy: Secondary | ICD-10-CM | POA: Diagnosis not present

## 2018-11-08 DIAGNOSIS — N200 Calculus of kidney: Secondary | ICD-10-CM

## 2018-11-08 HISTORY — PX: CYSTOSCOPY/URETEROSCOPY/HOLMIUM LASER/STENT PLACEMENT: SHX6546

## 2018-11-08 HISTORY — PX: CYSTOSCOPY W/ RETROGRADES: SHX1426

## 2018-11-08 LAB — URINE DRUG SCREEN, QUALITATIVE (ARMC ONLY)
Amphetamines, Ur Screen: NOT DETECTED
BARBITURATES, UR SCREEN: NOT DETECTED
Benzodiazepine, Ur Scrn: NOT DETECTED
CANNABINOID 50 NG, UR ~~LOC~~: POSITIVE — AB
Cocaine Metabolite,Ur ~~LOC~~: NOT DETECTED
MDMA (ECSTASY) UR SCREEN: NOT DETECTED
METHADONE SCREEN, URINE: NOT DETECTED
Opiate, Ur Screen: NOT DETECTED
Phencyclidine (PCP) Ur S: NOT DETECTED
TRICYCLIC, UR SCREEN: NOT DETECTED

## 2018-11-08 LAB — URINALYSIS, ROUTINE W REFLEX MICROSCOPIC
BACTERIA UA: NONE SEEN
BILIRUBIN URINE: NEGATIVE
Glucose, UA: NEGATIVE mg/dL
KETONES UR: NEGATIVE mg/dL
Nitrite: NEGATIVE
PH: 6 (ref 5.0–8.0)
PROTEIN: 30 mg/dL — AB
SPECIFIC GRAVITY, URINE: 1.014 (ref 1.005–1.030)

## 2018-11-08 SURGERY — CYSTOSCOPY/URETEROSCOPY/HOLMIUM LASER/STENT PLACEMENT
Anesthesia: General | Site: Ureter | Laterality: Left

## 2018-11-08 MED ORDER — TAMSULOSIN HCL 0.4 MG PO CAPS: 0.4000 mg | ORAL_CAPSULE | Freq: Every day | ORAL | 0 refills | Status: DC

## 2018-11-08 MED ORDER — PHENYLEPHRINE HCL 10 MG/ML IJ SOLN
INTRAMUSCULAR | Status: AC
  Filled 2018-11-08: qty 1

## 2018-11-08 MED ORDER — ONDANSETRON HCL 4 MG/2ML IJ SOLN
INTRAMUSCULAR | Status: AC
  Filled 2018-11-08: qty 2

## 2018-11-08 MED ORDER — EPHEDRINE SULFATE 50 MG/ML IJ SOLN
INTRAMUSCULAR | Status: AC
  Filled 2018-11-08: qty 1

## 2018-11-08 MED ORDER — FENTANYL CITRATE (PF) 100 MCG/2ML IJ SOLN
INTRAMUSCULAR | Status: DC | PRN
  Administered 2018-11-08: 100 ug via INTRAVENOUS

## 2018-11-08 MED ORDER — ALBUTEROL SULFATE HFA 108 (90 BASE) MCG/ACT IN AERS
INHALATION_SPRAY | RESPIRATORY_TRACT | Status: AC
  Filled 2018-11-08: qty 6.7

## 2018-11-08 MED ORDER — SODIUM CHLORIDE 0.9 % IV SOLN
INTRAVENOUS | Status: DC | PRN
  Administered 2018-11-08: 20 ug/min via INTRAVENOUS

## 2018-11-08 MED ORDER — ONDANSETRON HCL 4 MG/2ML IJ SOLN
INTRAMUSCULAR | Status: DC | PRN
  Administered 2018-11-08: 4 mg via INTRAVENOUS

## 2018-11-08 MED ORDER — CIPROFLOXACIN HCL 500 MG PO TABS: 500.0000 mg | ORAL_TABLET | Freq: Every day | ORAL | 0 refills | Status: AC

## 2018-11-08 MED ORDER — SUGAMMADEX SODIUM 200 MG/2ML IV SOLN
INTRAVENOUS | Status: AC
  Filled 2018-11-08: qty 4

## 2018-11-08 MED ORDER — LIDOCAINE HCL (PF) 2 % IJ SOLN
INTRAMUSCULAR | Status: AC
  Filled 2018-11-08: qty 10

## 2018-11-08 MED ORDER — ONDANSETRON HCL 4 MG/2ML IJ SOLN: 4.0000 mg | Freq: Once | INTRAMUSCULAR | Status: DC | PRN

## 2018-11-08 MED ORDER — ROCURONIUM BROMIDE 50 MG/5ML IV SOLN
INTRAVENOUS | Status: AC
  Filled 2018-11-08: qty 1

## 2018-11-08 MED ORDER — MIDAZOLAM HCL 2 MG/2ML IJ SOLN
INTRAMUSCULAR | Status: DC | PRN
  Administered 2018-11-08: 2 mg via INTRAVENOUS

## 2018-11-08 MED ORDER — FENTANYL CITRATE (PF) 100 MCG/2ML IJ SOLN: 25.0000 ug | INTRAMUSCULAR | Status: DC | PRN

## 2018-11-08 MED ORDER — PHENYLEPHRINE HCL 10 MG/ML IJ SOLN
INTRAMUSCULAR | Status: DC | PRN
  Administered 2018-11-08 (×2): 150 ug via INTRAVENOUS
  Administered 2018-11-08 (×3): 100 ug via INTRAVENOUS
  Administered 2018-11-08 (×2): 150 ug via INTRAVENOUS
  Administered 2018-11-08: 100 ug via INTRAVENOUS

## 2018-11-08 MED ORDER — GLYCOPYRROLATE 0.2 MG/ML IJ SOLN
INTRAMUSCULAR | Status: DC | PRN
  Administered 2018-11-08: 0.2 mg via INTRAVENOUS

## 2018-11-08 MED ORDER — DEXAMETHASONE SODIUM PHOSPHATE 10 MG/ML IJ SOLN
INTRAMUSCULAR | Status: AC
  Filled 2018-11-08: qty 1

## 2018-11-08 MED ORDER — FENTANYL CITRATE (PF) 100 MCG/2ML IJ SOLN
INTRAMUSCULAR | Status: AC
  Filled 2018-11-08: qty 2

## 2018-11-08 MED ORDER — SUGAMMADEX SODIUM 200 MG/2ML IV SOLN
INTRAVENOUS | Status: DC | PRN
  Administered 2018-11-08: 283.2 mg via INTRAVENOUS

## 2018-11-08 MED ORDER — OXYBUTYNIN CHLORIDE ER 10 MG PO TB24: 10.0000 mg | ORAL_TABLET | Freq: Every day | ORAL | 0 refills | Status: AC

## 2018-11-08 MED ORDER — LIDOCAINE HCL (CARDIAC) PF 100 MG/5ML IV SOSY
PREFILLED_SYRINGE | INTRAVENOUS | Status: DC | PRN
  Administered 2018-11-08: 100 mg via INTRAVENOUS

## 2018-11-08 MED ORDER — SUCCINYLCHOLINE CHLORIDE 20 MG/ML IJ SOLN
INTRAMUSCULAR | Status: AC
  Filled 2018-11-08: qty 1

## 2018-11-08 MED ORDER — DEXAMETHASONE SODIUM PHOSPHATE 10 MG/ML IJ SOLN
INTRAMUSCULAR | Status: DC | PRN
  Administered 2018-11-08: 10 mg via INTRAVENOUS

## 2018-11-08 MED ORDER — LACTATED RINGERS IV SOLN
INTRAVENOUS | Status: DC
  Administered 2018-11-08 (×2): via INTRAVENOUS

## 2018-11-08 MED ORDER — ROCURONIUM BROMIDE 100 MG/10ML IV SOLN
INTRAVENOUS | Status: DC | PRN
  Administered 2018-11-08: 10 mg via INTRAVENOUS
  Administered 2018-11-08: 30 mg via INTRAVENOUS
  Administered 2018-11-08: 10 mg via INTRAVENOUS

## 2018-11-08 MED ORDER — PROPOFOL 10 MG/ML IV BOLUS
INTRAVENOUS | Status: DC | PRN
  Administered 2018-11-08: 130 mg via INTRAVENOUS

## 2018-11-08 MED ORDER — MIDAZOLAM HCL 2 MG/2ML IJ SOLN
INTRAMUSCULAR | Status: AC
  Filled 2018-11-08: qty 2

## 2018-11-08 MED ORDER — IOPAMIDOL (ISOVUE-M 200) INJECTION 41%
INTRAMUSCULAR | Status: DC | PRN
  Administered 2018-11-08: 10 mL

## 2018-11-08 MED ORDER — SUCCINYLCHOLINE CHLORIDE 20 MG/ML IJ SOLN
INTRAMUSCULAR | Status: DC | PRN
  Administered 2018-11-08: 80 mg via INTRAVENOUS

## 2018-11-08 MED ORDER — EPHEDRINE SULFATE 50 MG/ML IJ SOLN
INTRAMUSCULAR | Status: DC | PRN
  Administered 2018-11-08 (×3): 5 mg via INTRAVENOUS

## 2018-11-08 MED ORDER — PROPOFOL 10 MG/ML IV BOLUS
INTRAVENOUS | Status: AC
  Filled 2018-11-08: qty 20

## 2018-11-08 MED ORDER — HYDROCODONE-ACETAMINOPHEN 5-325 MG PO TABS: 1.0000 | ORAL_TABLET | ORAL | 0 refills | Status: AC | PRN

## 2018-11-08 MED ORDER — CIPROFLOXACIN IN D5W 400 MG/200ML IV SOLN
INTRAVENOUS | Status: AC
  Filled 2018-11-08: qty 200

## 2018-11-08 SURGICAL SUPPLY — 30 items
BAG DRAIN CYSTO-URO LG1000N (MISCELLANEOUS) ×3 IMPLANT
BRUSH SCRUB EZ 1% IODOPHOR (MISCELLANEOUS) ×3 IMPLANT
BULB IRRIG PATHFIND (MISCELLANEOUS) IMPLANT
CATH URETL 5X70 OPEN END (CATHETERS) ×3 IMPLANT
CNTNR SPEC 2.5X3XGRAD LEK (MISCELLANEOUS)
CONT SPEC 4OZ STER OR WHT (MISCELLANEOUS)
CONTAINER SPEC 2.5X3XGRAD LEK (MISCELLANEOUS) IMPLANT
DRAPE UTILITY 15X26 TOWEL STRL (DRAPES) ×3 IMPLANT
FIBER LASER LITHO 273 (Laser) ×3 IMPLANT
GLOVE BIOGEL PI IND STRL 7.5 (GLOVE) ×1 IMPLANT
GLOVE BIOGEL PI INDICATOR 7.5 (GLOVE) ×2
GOWN STRL REUS W/ TWL LRG LVL3 (GOWN DISPOSABLE) ×3 IMPLANT
GOWN STRL REUS W/TWL LRG LVL3 (GOWN DISPOSABLE) ×6
INFUSOR MANOMETER BAG 3000ML (MISCELLANEOUS) ×3 IMPLANT
INTRODUCER DILATOR DOUBLE (INTRODUCER) ×3 IMPLANT
KIT TURNOVER CYSTO (KITS) ×3 IMPLANT
PACK CYSTO AR (MISCELLANEOUS) ×3 IMPLANT
SENSORWIRE 0.038 NOT ANGLED (WIRE) ×6
SET CYSTO W/LG BORE CLAMP LF (SET/KITS/TRAYS/PACK) ×3 IMPLANT
SHEATH URETERAL 12FRX35CM (MISCELLANEOUS) ×3 IMPLANT
SOL .9 NS 3000ML IRR  AL (IV SOLUTION) ×2
SOL .9 NS 3000ML IRR UROMATIC (IV SOLUTION) ×1 IMPLANT
STENT URET 6FRX24 CONTOUR (STENTS) IMPLANT
STENT URET 6FRX26 CONTOUR (STENTS) IMPLANT
SURGILUBE 2OZ TUBE FLIPTOP (MISCELLANEOUS) ×3 IMPLANT
SYR 10ML LL (SYRINGE) ×3 IMPLANT
TUBING ART PRESS 48 MALE/FEM (TUBING) IMPLANT
VALVE UROSEAL ADJ ENDO (VALVE) ×3 IMPLANT
WATER STERILE IRR 1000ML POUR (IV SOLUTION) ×3 IMPLANT
WIRE SENSOR 0.038 NOT ANGLED (WIRE) ×2 IMPLANT

## 2018-11-08 NOTE — Anesthesia Preprocedure Evaluation (Signed)
Anesthesia Evaluation  Patient identified by MRN, date of birth, ID band Patient awake    Reviewed: Allergy & Precautions, NPO status , Patient's Chart, lab work & pertinent test results, reviewed documented beta blocker date and time   History of Anesthesia Complications (+) Family history of anesthesia reaction  Airway Mallampati: III  TM Distance: >3 FB     Dental  (+) Chipped, Missing, Poor Dentition, Dental Advisory Given   Pulmonary pneumonia, resolved, COPD, Current Smoker,           Cardiovascular hypertension, Pt. on medications      Neuro/Psych PSYCHIATRIC DISORDERS Anxiety Depression Bipolar Disorder    GI/Hepatic GERD  Controlled,  Endo/Other  Hypothyroidism   Renal/GU      Musculoskeletal   Abdominal   Peds  Hematology   Anesthesia Other Findings HR 114. MJ use. EKG ok.  Reproductive/Obstetrics                             Anesthesia Physical Anesthesia Plan  ASA: III  Anesthesia Plan: General   Post-op Pain Management:    Induction: Intravenous  PONV Risk Score and Plan:   Airway Management Planned: LMA  Additional Equipment:   Intra-op Plan:   Post-operative Plan:   Informed Consent: I have reviewed the patients History and Physical, chart, labs and discussed the procedure including the risks, benefits and alternatives for the proposed anesthesia with the patient or authorized representative who has indicated his/her understanding and acceptance.     Plan Discussed with: CRNA  Anesthesia Plan Comments:         Anesthesia Quick Evaluation

## 2018-11-08 NOTE — Discharge Instructions (Addendum)
Cystoscopy, Care After Refer to this sheet in the next few weeks. These instructions provide you with information about caring for yourself after your procedure. Your health care provider may also give you more specific instructions. Your treatment has been planned according to current medical practices, but problems sometimes occur. Call your health care provider if you have any problems or questions after your procedure. What can I expect after the procedure? After the procedure, it is common to have:  Mild pain when you urinate. Pain should stop within a few minutes after you urinate. This may last for up to 1 week.  A small amount of blood in your urine for several days.  Feeling like you need to urinate but producing only a small amount of urine.  Follow these instructions at home:  Medicines  Take over-the-counter and prescription medicines only as told by your health care provider.  If you were prescribed an antibiotic medicine, take it as told by your health care provider. Do not stop taking the antibiotic even if you start to feel better. General instructions   Return to your normal activities as told by your health care provider. Ask your health care provider what activities are safe for you.  Do not drive for 24 hours if you received a sedative.  Watch for any blood in your urine. If the amount of blood in your urine increases, call your health care provider.  Follow instructions from your health care provider about eating or drinking restrictions.  If a tissue sample was removed for testing (biopsy) during your procedure, it is your responsibility to get your test results. Ask your health care provider or the department performing the test when your results will be ready.  Drink enough fluid to keep your urine clear or pale yellow.  Keep all follow-up visits as told by your health care provider. This is important. Contact a health care provider if:  You have pain that  gets worse or does not get better with medicine, especially pain when you urinate.  You have difficulty urinating. Get help right away if:  You have more blood in your urine.  You have blood clots in your urine.  You have abdominal pain.  You have a fever or chills.  You are unable to urinate. This information is not intended to replace advice given to you by your health care provider. Make sure you discuss any questions you have with your health care provider. Document Released: 06/30/2005 Document Revised: 05/18/2016 Document Reviewed: 10/28/2015 Elsevier Interactive Patient Education  2018 [employer].   Ureteroscopy, Care After This sheet gives you information about how to care for yourself after your procedure. Your health care provider may also give you more specific instructions. If you have problems or questions, contact your health care provider. What can I expect after the procedure? After the procedure, it is common to have:  A burning sensation when you urinate.  Blood in your urine.  Mild discomfort in the bladder area or kidney area when urinating.  Needing to urinate more often or urgently.  Follow these instructions at home: Medicines  Take over-the-counter and prescription medicines only as told by your health care provider.  If you were prescribed an antibiotic medicine, take it as told by your health care provider. Do not stop taking the antibiotic even if you start to feel better. General instructions   Donot drive for 24 hours if you were given a medicine to help you relax (sedative) during your  procedure.  To relieve burning, try taking a warm bath or holding a warm washcloth over your groin.  Drink enough fluid to keep your urine clear or pale yellow. ? Drink two 8-ounce glasses of water every hour for the first 2 hours after you get home. ? Continue to drink water often at home.  You can eat what you usually do.  Keep all follow-up visits  as told by your health care provider. This is important. ? If you had a tube placed to keep urine flowing (ureteral stent), ask your health care provider when you need to return to have it removed. Contact a health care provider if:  You have chills or a fever.  You have burning pain for longer than 24 hours after the procedure.  You have blood in your urine for longer than 24 hours after the procedure. Get help right away if:  You have large amounts of blood in your urine.  You have blood clots in your urine.  You have very bad pain.  You have chest pain or trouble breathing.  You are unable to urinate and you have the feeling of a full bladder. This information is not intended to replace advice given to you by your health care provider. Make sure you discuss any questions you have with your health care provider. Document Released: 12/16/2013 Document Revised: 09/26/2016 Document Reviewed: 09/22/2016 Elsevier Interactive Patient Education  2018 [employer]   1) The drugs that you were given will stay in your system until tomorrow so for the next 24 hours you should not:  A) Drive an automobile B) Make any legal decisions C) Drink any alcoholic beverage   2) You may resume regular meals tomorrow.  Today it is better to start with liquids and gradually work up to solid foods.  You may eat anything you prefer, but it is better to start with liquids, then soup and crackers, and gradually work up to solid foods.   3) Please notify your doctor immediately if you have any unusual bleeding, trouble breathing, redness and pain at the surgery site, drainage, fever, or pain not relieved by medication.    4) Additional Instructions:        Please contact your physician with any problems or Same Day Surgery at [phone], Monday through Friday 6 am to 4 pm, or [place] at [facility] number at [phone].

## 2018-11-08 NOTE — Telephone Encounter (Signed)
Schedule stent removal in 12-14 days    put her on my schedule for stent removal on the 25th or 26th. Then reschedule her MacDiarmid appt for 6-8 weeks.   App  Made    Sharyn Lull

## 2018-11-08 NOTE — H&P (Addendum)
UROLOGY H&P UPDATE  Agree with prior H&P dated 11/04/2018  Cardiac: RRR Lungs: Wheezing bilaterally  Laterality: LEFT Procedure: left ureteroscopy, laser lithotripsy, stent placement  Urinalysis:  Urine culture 11/04/2018 with asymptomatic E. coli.  Treated with culture appropriate Cipro. Urinalysis today with no bacteria, 6-10 RBCs, 6-10 WBCs, nitrite negative  Informed consent obtained, we specifically discussed the risk of bleeding, infection/urosepsis, possible need for staged procedure, possible temporary stent placement if any concern for infection, stent related symptoms including urgency, frequency, and dysuria, possible need for additional procedures.   Billey Co, MD 11/08/2018

## 2018-11-08 NOTE — Anesthesia Post-op Follow-up Note (Signed)
Anesthesia QCDR form completed.        

## 2018-11-08 NOTE — Anesthesia Postprocedure Evaluation (Signed)
Anesthesia Post Note  Patient: April Luna  Procedure(s) Performed: CYSTOSCOPY/URETEROSCOPY/HOLMIUM LASER/STENT PLACEMENT (Left Ureter) CYSTOSCOPY WITH RETROGRADE PYELOGRAM (Left Ureter)  Patient location during evaluation: PACU Anesthesia Type: General Level of consciousness: awake and alert Pain management: pain level controlled Vital Signs Assessment: post-procedure vital signs reviewed and stable Respiratory status: spontaneous breathing, nonlabored ventilation, respiratory function stable and patient connected to nasal cannula oxygen Cardiovascular status: blood pressure returned to baseline and stable Postop Assessment: no apparent nausea or vomiting Anesthetic complications: no     Last Vitals:  Vitals:   11/08/18 1418 11/08/18 1452  BP: (!) 134/93 (!) 137/92  Pulse: 99 (!) 105  Resp: 16 16  Temp: 36.6 C   SpO2: 100% 99%    Last Pain:  Vitals:   11/08/18 1418  TempSrc: Temporal  PainSc: [place]

## 2018-11-08 NOTE — Anesthesia Procedure Notes (Signed)
Procedure Name: Intubation Date/Time: 11/08/2018 11:20 AM Performed by: Lavone Orn, CRNA Pre-anesthesia Checklist: Patient identified, Emergency Drugs available, Suction available, Patient being monitored and Timeout performed Patient Re-evaluated:Patient Re-evaluated prior to induction Oxygen Delivery Method: Circle system utilized Preoxygenation: Pre-oxygenation with 100% oxygen Induction Type: IV induction Ventilation: Mask ventilation without difficulty Laryngoscope Size: Mac and 4 Grade View: Grade II Tube type: Oral Tube size: 7.0 mm Number of attempts: 1 Airway Equipment and Method: Stylet Placement Confirmation: ETT inserted through vocal cords under direct vision,  positive ETCO2 and breath sounds checked- equal and bilateral Secured at: 21 cm Tube secured with: Tape Dental Injury: Teeth and Oropharynx as per pre-operative assessment

## 2018-11-08 NOTE — Transfer of Care (Signed)
Immediate Anesthesia Transfer of Care Note  Patient: April Luna  Procedure(s) Performed: CYSTOSCOPY/URETEROSCOPY/HOLMIUM LASER/STENT PLACEMENT (Left Ureter) CYSTOSCOPY WITH RETROGRADE PYELOGRAM (Left Ureter)  Patient Location: PACU  Anesthesia Type:General  Level of Consciousness: awake and patient cooperative  Airway & Oxygen Therapy: Patient Spontanous Breathing and Patient connected to face mask  Post-op Assessment: Report given to RN and Post -op Vital signs reviewed and stable  Post vital signs: stable  Last Vitals:  Vitals Value Taken Time  BP 112/81 11/08/2018  1:33 PM  Temp 36.1 C 11/08/2018  1:33 PM  Pulse 107 11/08/2018  1:39 PM  Resp 11 11/08/2018  1:39 PM  SpO2 100 % 11/08/2018  1:39 PM  Vitals shown include unvalidated device data.  Last Pain:  Vitals:   11/08/18 1333  TempSrc:   PainSc: 0-No pain         Complications: No apparent anesthesia complications

## 2018-11-08 NOTE — Op Note (Signed)
Date of procedure: 11/08/18  Preoperative diagnosis:  1. Left 1.8 cm UPJ stone  Postoperative diagnosis:  1. Same  Procedure: 1. Cystoscopy, left ureteroscopy, retrograde pyelogram with intraoperative interpretation, laser lithotripsy, stent placement  Surgeon: Nickolas Madrid, MD  Anesthesia: General  Complications: None  Intraoperative findings:  1.  Normal cystoscopy, no lesions 2.  Impacted left ureteral stone dusted to sub-1 mm fragments, lower pole stones also dusted 3.  Uncomplicated left ureteral stent placement  EBL: Minimal  Specimens: Stone for analysis  Drains: Left 6 French by 22 cm ureteral stent, 16 French Foley  Indication: April Luna is a 47 y.o. patient with large 1.8 cm left UPJ stone.  She has been intermittently symptomatic.  She elected to proceed with left ureteroscopy, laser lithotripsy, and stent placement.  After reviewing the management options for treatment, they elected to proceed with the above surgical procedure(s). We have discussed the potential benefits and risks of the procedure, side effects of the proposed treatment, the likelihood of the patient achieving the goals of the procedure, and any potential problems that might occur during the procedure or recuperation. Informed consent has been obtained.  Description of procedure:  The patient was taken to the operating room and general anesthesia was induced.  The patient was placed in the dorsal lithotomy position, prepped and draped in the usual sterile fashion, and preoperative antibiotics(Cipro) were administered. A preoperative time-out was performed.   The 21 French rigid cystoscope was used to intubate the urethra.  Thorough cystoscopy demonstrated normal bladder mucosa throughout.  There were no concerning lesions.  We turned our attention to the left ureteral orifice and a sensor wire was passed alongside the stone with the aid of a 5 [place] access catheter.  A dual-lumen catheter was  then used to add a second safety sensor wire passed the stone.  A 12/14 F ureteral access sheath was then gently advanced under fluoroscopic vision over the wire up to the level of the stone.  The single-channel digital flexible ureteroscope was advanced to the sheath and a black impacted ureteral stone was identified.  This was methodically dusted on settings of 0.5 J and 20 Hz.  The stone was quite hard, and maneuverability was very difficult at first.  However once we were able to have better access to the stone it was efficiently dusted.  The scope was maneuvered into the renal pelvis and a small collection of 4-5 mm stones in the lower pole were lasered into sub-1 mm fragments.  Thorough pyeloscopy demonstrated no residual significant fragments.  10 cc of contrast were injected through the scope and showed no residual filling defects.  Pullback ureteroscopy was performed and some small additional fragments were lasered.  There was no ureteral trauma from the access sheath.  A left 6 [place] by 22 cm ureteral stent was uneventfully placed over the wire.  An excellent curl was noted in the upper pole.  The rigid cystoscope was replaced which demonstrated a excellent curl in the bladder, and residual fragments in the bladder were evacuated and sent for stone analysis.  A 16 French Foley was placed for decompression in PACU, and will be removed prior to discharge.  Disposition: Stable to PACU  Plan: Follow-up for stent removal in 2 weeks, prophylactic Cipro 500 mg daily while stent in place Continue Flomax Remove Foley prior to discharge Follow-up stone analysis Will need renal ultrasound in 4 to 6 weeks to evaluate for residual silent hydronephrosis  Nickolas Madrid, MD

## 2018-11-11 ENCOUNTER — Encounter: Payer: Self-pay | Admitting: Urology

## 2018-11-11 ENCOUNTER — Other Ambulatory Visit: Payer: Self-pay

## 2018-11-11 DIAGNOSIS — E032 Hypothyroidism due to medicaments and other exogenous substances: Secondary | ICD-10-CM

## 2018-11-11 MED ORDER — LEVOTHYROXINE SODIUM 75 MCG PO TABS: 75.0000 ug | ORAL_TABLET | Freq: Every day | ORAL | 0 refills | Status: DC

## 2018-11-11 NOTE — Telephone Encounter (Signed)
Refill request for thyroid medication. Levothyroxine to [facility].   Last visit: 09/02/18   Lab Results  Component Value Date   TSH 0.84 09/02/2018     Follow up on 01/02/2019

## 2018-11-13 ENCOUNTER — Telehealth: Payer: Self-pay | Admitting: Urology

## 2018-11-13 NOTE — Telephone Encounter (Signed)
I called and spoke with April Luna, sounds like she is actually experiencing bladder spasms. She was instructed to make sure she is taking her oxybutynin. She states the pain comes and goes. Informed April Luna to contact us if she begins to pass large clots of blood or if pain becomes unbearable.

## 2018-11-13 NOTE — Telephone Encounter (Signed)
Pt called office states she just had surgery 15th, pt states she is having pulsating kidney pain. Pt would like someone to call her to advise her of normal or not. Please call [phone].

## 2018-11-15 ENCOUNTER — Encounter: Payer: Self-pay | Admitting: Urology

## 2018-11-15 LAB — STONE ANALYSIS
CA OXALATE, MONOHYDR.: 50 %
Ca Oxalate,Dihydrate: 15 %
Ca phos cry stone ql IR: 35 %
Stone Weight KSTONE: 100.3 mg

## 2018-11-18 ENCOUNTER — Other Ambulatory Visit: Payer: Self-pay

## 2018-11-18 ENCOUNTER — Encounter: Payer: Self-pay | Admitting: Urology

## 2018-11-18 ENCOUNTER — Ambulatory Visit: Payer: Self-pay | Admitting: Urology

## 2018-11-18 ENCOUNTER — Ambulatory Visit (INDEPENDENT_AMBULATORY_CARE_PROVIDER_SITE_OTHER): Payer: Medicare Other | Admitting: Urology

## 2018-11-18 VITALS — BP 133/84 | HR 88 | Wt 154.0 lb

## 2018-11-18 DIAGNOSIS — N200 Calculus of kidney: Secondary | ICD-10-CM

## 2018-11-18 LAB — URINALYSIS, COMPLETE
BILIRUBIN UA: NEGATIVE
Glucose, UA: NEGATIVE
KETONES UA: NEGATIVE
NITRITE UA: NEGATIVE
PH UA: 5.5 (ref 5.0–7.5)
Specific Gravity, UA: >1.03 — ABNORMAL HIGH (ref 1.005–1.030)
UUROB: 0.2 mg/dL (ref 0.2–1.0)

## 2018-11-18 LAB — MICROSCOPIC EXAMINATION: RBC, UA: >30 /hpf — ABNORMAL HIGH (ref 0–2)

## 2018-11-18 NOTE — Progress Notes (Signed)
Cystoscopy Procedure Note:  Indication: Stent removal s/p left URS/LL/stent for 1.8 cm UPJ stone  After informed consent and discussion of the procedure and its risks, April Luna was positioned and prepped in the standard fashion. Cystoscopy was performed with a flexible cystoscope. The stent was grasped with flexible graspers and removed in its entirety. The patient tolerated the procedure well.  Findings: Uncomplicated stent removal  Assessment and Plan: Follow up in 4 weeks with renal ultrasound to evaluate for silent hydronephrosis  Billey Co, MD 11/18/2018

## 2018-11-29 ENCOUNTER — Ambulatory Visit: Payer: Self-pay | Admitting: *Deleted

## 2018-11-29 NOTE — Telephone Encounter (Signed)
Pt called with having the right side of her face swollen down to her neck. She denies having a toothache, even though she says that she has cavities. It is painful to touch and she has been taking acetaminophen. She felt feverish last night. Did not check temperature.  She said about a year ago the same thing happened on the left side of her face and she did have a toothache. Her right ear feels full but she does not have an earache. No itching noted and does not look like a bug bite. She can see the swelling inside of her mouth when she looks down her throat.  Per protocol she should be seen within 4 hours. Recommended an urgent care. She is not going to go until tomorrow, per pt. Advised that if she starts having trouble swallowing or breathing she should call 911. Pt voiced understanding. Home care advice given to patient with verbal understanding. Routing to flow at [facility].  Reason for Disposition . [1] Swelling is red AND [2] very painful to touch  Answer Assessment - Initial Assessment Questions 1. ONSET: "When did the swelling start?" (e.g., minutes, hours, days)     This morning 2. LOCATION: "What part of the face is swollen?"     Right side 3. SEVERITY: "How swollen is it?"     Swollen down into the neck and more swollen than the left side 4. ITCHING: "Is there any itching?" If so, ask: "How much?"   (Scale 1-10; mild, moderate or severe)     None 5. PAIN: "Is the swelling painful to touch?" If so, ask: "How painful is it?"   (Scale 1-10; mild, moderate or severe)     Pain #4 6. FEVER: "Do you have a fever?" If so, ask: "What is it, how was it measured, and when did it start?"      Felt feverish last night 7. CAUSE: "What do you think is causing the face swelling?"     Not sure 8. RECURRENT SYMPTOM: "Have you had face swelling before?" If so, ask: "When was the last time?" "What happened that time?"     Yes has has this before and had an infection in her  tooth on the left side about a year ago and it upper jaw 9. OTHER SYMPTOMS: "Do you have any other symptoms?" (e.g., toothache, leg swelling)     no 10. PREGNANCY: "Is there any chance you are pregnant?" "When was your last menstrual period?"       No, menopausal  Protocols used: [facility]

## 2018-12-01 DIAGNOSIS — K047 Periapical abscess without sinus: Secondary | ICD-10-CM | POA: Diagnosis not present

## 2018-12-23 ENCOUNTER — Ambulatory Visit: Payer: Self-pay | Admitting: Urology

## 2019-01-01 ENCOUNTER — Ambulatory Visit
Admission: RE | Admit: 2019-01-01 | Discharge: 2019-01-01 | Disposition: A | Discharge status: Home / Self Care | Payer: Medicare Other | Source: Ambulatory Visit | Attending: Urology | Admitting: Urology

## 2019-01-01 DIAGNOSIS — N200 Calculus of kidney: Secondary | ICD-10-CM | POA: Insufficient documentation

## 2019-01-02 ENCOUNTER — Encounter: Payer: Self-pay | Admitting: Family Medicine

## 2019-01-02 ENCOUNTER — Ambulatory Visit (INDEPENDENT_AMBULATORY_CARE_PROVIDER_SITE_OTHER): Payer: Medicare Other | Admitting: Family Medicine

## 2019-01-02 ENCOUNTER — Telehealth: Payer: Self-pay | Admitting: Urology

## 2019-01-02 VITALS — BP 114/70 | HR 115 | Temp 98.6°F | Resp 20 | Ht 59.0 in | Wt 153.1 lb

## 2019-01-02 DIAGNOSIS — E032 Hypothyroidism due to medicaments and other exogenous substances: Secondary | ICD-10-CM

## 2019-01-02 DIAGNOSIS — G8929 Other chronic pain: Secondary | ICD-10-CM

## 2019-01-02 DIAGNOSIS — J411 Mucopurulent chronic bronchitis: Secondary | ICD-10-CM | POA: Diagnosis not present

## 2019-01-02 DIAGNOSIS — K5909 Other constipation: Secondary | ICD-10-CM

## 2019-01-02 DIAGNOSIS — R7303 Prediabetes: Secondary | ICD-10-CM

## 2019-01-02 DIAGNOSIS — N3289 Other specified disorders of bladder: Secondary | ICD-10-CM

## 2019-01-02 DIAGNOSIS — K219 Gastro-esophageal reflux disease without esophagitis: Secondary | ICD-10-CM

## 2019-01-02 DIAGNOSIS — M5441 Lumbago with sciatica, right side: Secondary | ICD-10-CM

## 2019-01-02 DIAGNOSIS — R300 Dysuria: Secondary | ICD-10-CM | POA: Diagnosis not present

## 2019-01-02 DIAGNOSIS — F3110 Bipolar disorder, current episode manic without psychotic features, unspecified: Secondary | ICD-10-CM

## 2019-01-02 DIAGNOSIS — I10 Essential (primary) hypertension: Secondary | ICD-10-CM

## 2019-01-02 LAB — POCT URINALYSIS DIPSTICK
Bilirubin, UA: NEGATIVE
Glucose, UA: NEGATIVE
KETONES UA: NEGATIVE
NITRITE UA: NEGATIVE
PH UA: 6 (ref 5.0–8.0)
PROTEIN UA: NEGATIVE
SPEC GRAV UA: 1.02 (ref 1.010–1.025)
UROBILINOGEN UA: 0.2 U/dL

## 2019-01-02 LAB — POCT GLYCOSYLATED HEMOGLOBIN (HGB A1C): HbA1c, POC (controlled diabetic range): 6.5 % (ref 0.0–7.0)

## 2019-01-02 MED ORDER — FLUTICASONE-UMECLIDIN-VILANT 100-62.5-25 MCG/INH IN AEPB: 1.0000 | INHALATION_SPRAY | Freq: Every day | RESPIRATORY_TRACT | 5 refills | Status: DC

## 2019-01-02 MED ORDER — ALBUTEROL SULFATE HFA 108 (90 BASE) MCG/ACT IN AERS: INHALATION_SPRAY | RESPIRATORY_TRACT | 0 refills | Status: DC

## 2019-01-02 MED ORDER — HYDROCHLOROTHIAZIDE 12.5 MG PO TABS: 12.5000 mg | ORAL_TABLET | Freq: Every day | ORAL | 5 refills | Status: DC

## 2019-01-02 MED ORDER — LEVOTHYROXINE SODIUM 75 MCG PO TABS: 75.0000 ug | ORAL_TABLET | Freq: Every day | ORAL | 5 refills | Status: DC

## 2019-01-02 MED ORDER — PANTOPRAZOLE SODIUM 40 MG PO TBEC: 40.0000 mg | DELAYED_RELEASE_TABLET | Freq: Every day | ORAL | 5 refills | Status: DC

## 2019-01-02 MED ORDER — LUBIPROSTONE 24 MCG PO CAPS: 24.0000 ug | ORAL_CAPSULE | Freq: Every day | ORAL | 5 refills | Status: DC

## 2019-01-02 NOTE — Telephone Encounter (Signed)
-----   Message from Billey Co, MD sent at 01/02/2019  8:20 AM EST ----- Regarding: follow up change This is a patient sent to me by Dr. Matilde Sprang and I have already operated on her. Please change her upcoming follow up appointment to be with me instead of MacDiarmid, thanks  Nickolas Madrid, MD 01/02/2019

## 2019-01-02 NOTE — Progress Notes (Signed)
Name: April Luna   MRN: [id number]    DOB: 1971-11-09   Date:01/02/2019       Progress Note  Subjective  Chief Complaint  Chief Complaint  Patient presents with  . Medication Refill  . Over Active Bladder  . COPD  . Hyperlipidemia  . Hypertension  . Constipation  . Manic Behavior    HPI  COPD: she is now on Anoro Elipta since April 2019, she states over the past week she had URI symptoms and worsening of cough that has been productive, pale and some brown. She has daily wheezing and SOB is stable  Still smoking, and states difficulty quitting because has two other family members that also smoke in the house. No fever. Normal appetite  HTN: bp is at goal, no dizziness, has occasional palpitation usually triggered by panic attack    Hyperlipidemia: we switched from Pravachol to Crestor back in April , last LDL improved it was 88.   Recurrent UTI: seeing Urologist, she has recurrent UTI infections, denies dysuria at this time She has follow up with Urologist next week   Chronic constipation: she states she has been taking Amitiza BID , she takes it prn   Pre-diabetes: she denies polyphagia, but has episodes of polydipsia and polyuria. Last hgbA1C was 6.4% today is 6.5% , she has not been compliant with her diet. She has a history of gestational diabetes  Bipolar disorder: she is seeing psychiatrist, pqh 9 is better today, taking medications as prescribed , she states she feels okay right now  Hypothyroidism: she is taking levothyroxine and denies hair loss, she has chronic dry skin . Last TSH was at goal   Chronic right lower back pain: going on for years, but worse recently, she already has an appointment with chiropractor for next week. Pain occasionally goes down right buttocks and leg.   Patient Active Problem List   Diagnosis Date Noted  . ASCUS of cervix with negative high risk HPV 09/05/2018  . Pre-diabetes 06/05/2018  . GERD without esophagitis 04/11/2018   . Hyperlipidemia 04/11/2018  . Hypertension 04/11/2018  . Hypothyroidism 04/26/2015  . Bipolar I disorder, most recent episode (or current) manic ([place]) 04/25/2015  . Delirium due to another medical condition 04/25/2015  . Cannabis abuse 04/25/2015    Past Surgical History:  Procedure Laterality Date  . CESAREAN SECTION    . CYSTOSCOPY W/ RETROGRADES Left 11/08/2018   Procedure: CYSTOSCOPY WITH RETROGRADE PYELOGRAM;  Surgeon: Billey Co, MD;  Location: [facility];  Service: Urology;  Laterality: Left;  . CYSTOSCOPY/URETEROSCOPY/HOLMIUM LASER/STENT PLACEMENT Left 11/08/2018   Procedure: CYSTOSCOPY/URETEROSCOPY/HOLMIUM LASER/STENT PLACEMENT;  Surgeon: Billey Co, MD;  Location: [facility];  Service: Urology;  Laterality: Left;  . MOUTH SURGERY     wisdom teeth extraction    Family History  Problem Relation Age of Onset  . Depression Mother   . Anxiety disorder Mother   . Diabetes Mother   . Hypertension Mother   . Hyperlipidemia Mother   . Cancer Mother   . Cancer Father        colon cancer  . Depression Brother   . Anxiety disorder Brother   . Diabetes Mellitus II Maternal Grandmother   . Hypercholesterolemia Maternal Grandmother   . Cancer Paternal Grandmother   . Diabetes Paternal Grandmother     Social History   Socioeconomic History  . Marital status: Married    Spouse name: Montine Circle  . Number of children: 1  . Years  of education: 61  . Highest education level: High school graduate  Occupational History  . Occupation: unemployed    Comment: disabled  Social Needs  . Financial resource strain: Not hard at all  . Food insecurity:    Worry: Never true    Inability: Never true  . Transportation needs:    Medical: Yes    Non-medical: Yes  Tobacco Use  . Smoking status: Current Every Day Smoker    Packs/day: 1.00    Years: 34.00    Pack years: 34.00    Types: Cigarettes    Start date: 05/13/1985  . Smokeless tobacco: Never Used  Substance  and Sexual Activity  . Alcohol use: Not Currently    Alcohol/week: 0.0 standard drinks    Comment: rarely  . Drug use: Not Currently    Types: Marijuana    Comment: none since August 2019  . Sexual activity: Not Currently    Birth control/protection: Post-menopausal  Lifestyle  . Physical activity:    Days per week: 0 days    Minutes per session: 0 min  . Stress: To some extent  Relationships  . Social connections:    Talks on phone: Three times a week    Gets together: Once a week    Attends religious service: Never    Active member of club or organization: No    Attends meetings of clubs or organizations: Never    Relationship status: Married  . Intimate partner violence:    Fear of current or ex partner: No    Emotionally abused: No    Physically abused: No    Forced sexual activity: No  Other Topics Concern  . Not on file  Social History Narrative  . Not on file     Current Outpatient Medications:  .  amantadine (SYMMETREL) 100 MG capsule, Take 100 mg by mouth 2 (two) times daily. , Disp: , Rfl:  .  buPROPion (WELLBUTRIN XL) 150 MG 24 hr tablet, Take 150 mg by mouth daily. , Disp: , Rfl:  .  Cholecalciferol (VITAMIN D-3) 125 MCG (5000 UT) TABS, Take 5,000 Units by mouth daily., Disp: , Rfl:  .  gabapentin (NEURONTIN) 300 MG capsule, Take 300 mg by mouth 2 (two) times daily., Disp: , Rfl:  .  hydrochlorothiazide (HYDRODIURIL) 12.5 MG tablet, Take 1 tablet (12.5 mg total) by mouth daily., Disp: 30 tablet, Rfl: 5 .  hydrOXYzine (VISTARIL) 50 MG capsule, Take 50 mg by mouth 2 (two) times daily as needed for anxiety., Disp: , Rfl:  .  levothyroxine (SYNTHROID, LEVOTHROID) 75 MCG tablet, Take 1 tablet (75 mcg total) by mouth daily at 6 (six) AM., Disp: 90 tablet, Rfl: 0 .  lubiprostone (AMITIZA) 24 MCG capsule, Take 1 capsule (24 mcg total) by mouth daily with breakfast., Disp: 30 capsule, Rfl: 5 .  Paliperidone Palmitate ER (INVEGA TRINZA) 819 MG/2.625ML SUSY, Inject 819 mg  into the muscle every 3 (three) months., Disp: , Rfl:  .  pantoprazole (PROTONIX) 40 MG tablet, Take 1 tablet (40 mg total) by mouth daily at 6 (six) AM., Disp: 30 tablet, Rfl: 5 .  prazosin (MINIPRESS) 2 MG capsule, Take 2 mg by mouth every morning., Disp: , Rfl:  .  PROAIR HFA 108 (90 Base) MCG/ACT inhaler, TAKE 2 PUFFS BY MOUTH EVERY 6 HOURS AS NEEDED FOR WHEEZE OR SHORTNESS OF BREATH (Patient taking differently: Inhale 2 puffs into the lungs every 6 (six) hours as needed for wheezing or shortness of breath. ),  Disp: 8.5 Inhaler, Rfl: 0 .  rosuvastatin (CRESTOR) 20 MG tablet, Take 1 tablet (20 mg total) by mouth daily. In place of pravastatin (Patient taking differently: Take 20 mg by mouth at bedtime. In place of pravastatin), Disp: 30 tablet, Rfl: 5 .  sertraline (ZOLOFT) 100 MG tablet, Take 200 mg by mouth daily. , Disp: , Rfl:  .  umeclidinium-vilanterol (ANORO ELLIPTA) 62.5-25 MCG/INH AEPB, Inhale 1 puff into the lungs daily., Disp: 60 each, Rfl: 5 .  tamsulosin (FLOMAX) 0.4 MG CAPS capsule, Take 1 capsule (0.4 mg total) by mouth daily after supper. (Patient not taking: Reported on 01/02/2019), Disp: 30 capsule, Rfl: 0  Allergies  Allergen Reactions  . Penicillins Other (See Comments)    GI distress Has patient had a PCN reaction causing immediate rash, facial/tongue/throat swelling, SOB or lightheadedness with hypotension: No Has patient had a PCN reaction causing severe rash involving mucus membranes or skin necrosis: No Has patient had a PCN reaction that required hospitalization: No Has patient had a PCN reaction occurring within the last 10 years: No If all of the above answers are "NO", then may proceed with Cephalosporin use.   Marland Kitchen Perphenazine Other (See Comments)    Tremors, muscle weakness, tongue swelling  . Sulfa Antibiotics Other (See Comments)    GI distress   . Abilify [Aripiprazole] Rash    I personally reviewed active problem list, medication list, allergies, family  history, social history with the patient/caregiver today.   ROS  Constitutional: Negative for fever or weight change.  Respiratory: positive for cough and shortness of breath.   Cardiovascular: Negative for chest pain , she has intermittent  palpitations.  Gastrointestinal: Negative for abdominal pain, no bowel changes.  Musculoskeletal: positive  for gait problem but no  joint swelling.  Skin: Negative for rash.  Neurological: Negative for dizziness or headache.  No other specific complaints in a complete review of systems (except as listed in HPI above).   Objective  Vitals:   01/02/19 1000  BP: 114/70  Pulse: (!) 115  Resp: 20  Temp: 98.6 F (37 C)  TempSrc: Oral  SpO2: 95%  Weight: 153 lb 1.6 oz (69.4 kg)  Height: 4\' 11"  (1.499 m)    Body mass index is 30.92 kg/m.  Physical Exam  Constitutional: Patient appears well-developed and well-nourished. Obese No distress.  HEENT: head atraumatic, normocephalic, pupils equal and reactive to light, neck supple, throat within normal limits Cardiovascular: Normal rate, regular rhythm and normal heart sounds.  No murmur heard. No BLE edema. Pulmonary/Chest: Effort normal but bilateral wheezing and rhonchi. No respiratory distress. Abdominal: Soft.  There is no tenderness. Muscular Skeletal: pain during palpation of right lumbar spine, no symptoms of radiculitis  Psychiatric: Patient has a normal mood and affect. behavior is normal. Judgment and thought content normal.  Recent Results (from the past 2160 hour(s))  Urinalysis, Complete     Status: Abnormal   Collection Time: 10/10/18 10:49 AM  Result Value Ref Range   Specific Gravity, UA 1.025 1.005 - 1.030   pH, UA 6.0 5.0 - 7.5   Color, UA Yellow Yellow   Appearance Ur Cloudy (A) Clear   Leukocytes, UA 1+ (A) Negative   Protein, UA 1+ (A) Negative/Trace   Glucose, UA Negative Negative   Ketones, UA Negative Negative   RBC, UA 2+ (A) Negative   Bilirubin, UA Negative  Negative   Urobilinogen, Ur 0.2 0.2 - 1.0 mg/dL   Nitrite, UA Positive (A) Negative  Microscopic Examination See below:   Microscopic Examination     Status: Abnormal   Collection Time: 10/10/18 10:49 AM  Result Value Ref Range   WBC, UA 5-10 (A) 0 - 5 /hpf   RBC, UA 3-10 (A) 0 - 2 /hpf   Epithelial Cells (non renal) 0-10 0 - 10 /hpf   Bacteria, UA Many (A) None seen/Few  CULTURE, URINE COMPREHENSIVE     Status: Abnormal   Collection Time: 10/10/18 11:11 AM  Result Value Ref Range   Urine Culture, Comprehensive Final report (A)    Organism ID, Bacteria Escherichia coli (A)     Comment: Greater than 100,000 colony forming units per mL Cefazolin <=4 ug/mL Cefazolin with an MIC <=16 predicts susceptibility to the oral agents cefaclor, cefdinir, cefpodoxime, cefprozil, cefuroxime, cephalexin, and loracarbef when used for therapy of uncomplicated urinary tract infections due to E. coli, Klebsiella pneumoniae, and Proteus mirabilis.    ANTIMICROBIAL SUSCEPTIBILITY Comment     Comment:       ** S = Susceptible; I = Intermediate; R = Resistant **                    P = Positive; N = Negative             MICS are expressed in micrograms per mL    Antibiotic                 RSLT#1    RSLT#2    RSLT#3    RSLT#4 Amoxicillin/Clavulanic Acid    S Ampicillin                     S Cefepime                       S Ceftriaxone                    S Cefuroxime                     S Ciprofloxacin                  S Ertapenem                      S Gentamicin                     S Imipenem                       S Levofloxacin                   S Meropenem                      S Nitrofurantoin                 S Piperacillin/Tazobactam        S Tetracycline                   S Tobramycin                     S Trimethoprim/Sulfa             S   Urinalysis, Complete     Status: Abnormal   Collection Time: 11/04/18  2:26 PM  Result Value Ref Range   Specific Gravity, UA 1.015  1.005 - 1.030    pH, UA 6.0 5.0 - 7.5   Color, UA Yellow Yellow   Appearance Ur Cloudy (A) Clear   Leukocytes, UA 2+ (A) Negative   Protein, UA 1+ (A) Negative/Trace   Glucose, UA Negative Negative   Ketones, UA Negative Negative   RBC, UA 2+ (A) Negative   Bilirubin, UA Negative Negative   Urobilinogen, Ur 0.2 0.2 - 1.0 mg/dL   Nitrite, UA Positive (A) Negative   Microscopic Examination See below:   Microscopic Examination     Status: Abnormal   Collection Time: 11/04/18  2:26 PM  Result Value Ref Range   WBC, UA 11-30 (A) 0 - 5 /hpf   RBC, UA 3-10 (A) 0 - 2 /hpf   Epithelial Cells (non renal) 0-10 0 - 10 /hpf   Casts Present (A) None seen /lpf   Cast Type Hyaline casts N/A   Mucus, UA Present (A) Not Estab.   Bacteria, UA Many (A) None seen/Few  CULTURE, URINE COMPREHENSIVE     Status: Abnormal   Collection Time: 11/04/18  3:08 PM  Result Value Ref Range   Urine Culture, Comprehensive Final report (A)    Organism ID, Bacteria Escherichia coli (A)     Comment: Greater than 100,000 colony forming units per mL   ANTIMICROBIAL SUSCEPTIBILITY Comment     Comment:       ** S = Susceptible; I = Intermediate; R = Resistant **                    P = Positive; N = Negative             MICS are expressed in micrograms per mL    Antibiotic                 RSLT#1    RSLT#2    RSLT#3    RSLT#4 Amoxicillin/Clavulanic Acid    S Ampicillin                     I Cefepime                       S Ceftriaxone                    S Cefuroxime                     I Ciprofloxacin                  S Ertapenem                      S Gentamicin                     S Imipenem                       S Levofloxacin                   S Meropenem                      S Nitrofurantoin                 R Piperacillin/Tazobactam        S Tetracycline  I Tobramycin                     S Trimethoprim/Sulfa             S   Urine Drug Screen, Qualitative ([facility] only)     Status: Abnormal   Collection Time:  11/08/18 10:17 AM  Result Value Ref Range   Tricyclic, Ur Screen NONE DETECTED NONE DETECTED   Amphetamines, Ur Screen NONE DETECTED NONE DETECTED   MDMA (Ecstasy)Ur Screen NONE DETECTED NONE DETECTED   Cocaine Metabolite,Ur [place] NONE DETECTED NONE DETECTED   Opiate, Ur Screen NONE DETECTED NONE DETECTED   Phencyclidine (PCP) Ur S NONE DETECTED NONE DETECTED   Cannabinoid 50 Ng, Ur [place] POSITIVE (A) NONE DETECTED   Barbiturates, Ur Screen NONE DETECTED NONE DETECTED   Benzodiazepine, Ur Scrn NONE DETECTED NONE DETECTED   Methadone Scn, Ur NONE DETECTED NONE DETECTED    Comment: (NOTE) Tricyclics + metabolites, urine    Cutoff 1000 ng/mL Amphetamines + metabolites, urine  Cutoff 1000 ng/mL MDMA (Ecstasy), urine              Cutoff 500 ng/mL Cocaine Metabolite, urine          Cutoff 300 ng/mL Opiate + metabolites, urine        Cutoff 300 ng/mL Phencyclidine (PCP), urine         Cutoff 25 ng/mL Cannabinoid, urine                 Cutoff 50 ng/mL Barbiturates + metabolites, urine  Cutoff 200 ng/mL Benzodiazepine, urine              Cutoff 200 ng/mL Methadone, urine                   Cutoff 300 ng/mL The urine drug screen provides only a preliminary, unconfirmed analytical test result and should not be used for non-medical purposes. Clinical consideration and professional judgment should be applied to any positive drug screen result due to possible interfering substances. A more specific alternate chemical method must be used in order to obtain a confirmed analytical result. Gas chromatography / mass spectrometry (GC/MS) is the preferred confirmat ory method. Performed at [facility], [address] [place], [place] [address]   Urinalysis, Routine w reflex microscopic     Status: Abnormal   Collection Time: 11/08/18 10:17 AM  Result Value Ref Range   Color, Urine YELLOW (A) YELLOW   APPearance CLEAR (A) CLEAR   Specific Gravity, Urine 1.014 1.005 - 1.030   pH 6.0 5.0 - 8.0    Glucose, UA NEGATIVE NEGATIVE mg/dL   Hgb urine dipstick MODERATE (A) NEGATIVE   Bilirubin Urine NEGATIVE NEGATIVE   Ketones, ur NEGATIVE NEGATIVE mg/dL   Protein, ur 30 (A) NEGATIVE mg/dL   Nitrite NEGATIVE NEGATIVE   Leukocytes, UA SMALL (A) NEGATIVE   RBC / HPF 6-10 0 - 5 RBC/hpf   WBC, UA 6-10 0 - 5 WBC/hpf   Bacteria, UA NONE SEEN NONE SEEN   Squamous Epithelial / LPF 0-5 0 - 5    Comment: Performed at [facility], [address] [place], [place] [address]  Joaquim Lai analysis     Status: None   Collection Time: 11/08/18  1:04 PM  Result Value Ref Range   Color Brown    Size Comment mm    Comment: Specimen received as fragments.   Stone Weight KSTONE 100.3 mg   Nidus No Nidus  visualized    Ca Oxalate,Dihydrate 15 %   Ca Oxalate,Monohydr. 50 %   Ca phos cry stone ql IR 35 %   Composition Comment     Comment: Percentage (Represents the % composition)   Photo Comment     Comment: Photograph will follow under separate cover.   Comment: Comment     Comment: (NOTE) Physician questions regarding [facility] Analysis contact [facility] at: [phone].    PLEASE NOTE: Comment     Comment: (NOTE) Calculi report with photograph will follow via computer, mail or courier delivery.    Disclaimer - Kidney Stone Analysis: Comment     Comment: (NOTE) This test was developed and its performance characteristics determined by [facility]. It has not been cleared or approved by the Food and Drug Administration. Performed At: [facility] [place], [place] [id number] Rush Farmer MD ST:[id number]   Urinalysis, Complete     Status: Abnormal   Collection Time: 11/18/18  1:52 PM  Result Value Ref Range   Specific Gravity, UA >1.030 (H) 1.005 - 1.030   pH, UA 5.5 5.0 - 7.5   Color, UA Yellow Yellow   Appearance Ur Cloudy (A) Clear   Leukocytes, UA 1+ (A) Negative   Protein, UA 3+ (A) Negative/Trace   Glucose, UA Negative Negative   Ketones, UA Negative  Negative   RBC, UA 3+ (A) Negative   Bilirubin, UA Negative Negative   Urobilinogen, Ur 0.2 0.2 - 1.0 mg/dL   Nitrite, UA Negative Negative   Microscopic Examination See below:   Microscopic Examination     Status: Abnormal   Collection Time: 11/18/18  1:52 PM  Result Value Ref Range   WBC, UA 6-10 (A) 0 - 5 /hpf   RBC, UA >30 (H) 0 - 2 /hpf   Epithelial Cells (non renal) 0-10 0 - 10 /hpf   Mucus, UA Present (A) Not Estab.   Bacteria, UA Moderate (A) None seen/Few  POCT HgB A1C     Status: Normal   Collection Time: 01/02/19 10:20 AM  Result Value Ref Range   Hemoglobin A1C     HbA1c POC (<> result, manual entry)     HbA1c, POC (prediabetic range)     HbA1c, POC (controlled diabetic range) 6.5 0.0 - 7.0 %      PHQ2/9: Depression screen [facility] 2/9 01/02/2019 09/02/2018 05/14/2018 04/11/2018  Decreased Interest 1 2 1 1   Down, Depressed, Hopeless 1 0 0 0  PHQ - 2 Score 2 2 1 1   Altered sleeping 2 3 3  -  Tired, decreased energy 3 3 3  -  Change in appetite 2 3 1  -  Feeling bad or failure about yourself  0 0 1 -  Trouble concentrating 2 2 2  -  Moving slowly or fidgety/restless 1 3 1  -  Suicidal thoughts 0 0 0 -  PHQ-9 Score 12 16 12  -  Difficult doing work/chores Somewhat difficult Somewhat difficult Somewhat difficult -     Fall Risk: Fall Risk  01/02/2019 09/02/2018 05/14/2018 04/11/2018  Falls in the past year? 0 No No No  Number falls in past yr: 0 - - -  Injury with Fall? 0 - - -      Functional Status Survey: Is the patient deaf or have difficulty hearing?: No Does the patient have difficulty seeing, even when wearing glasses/contacts?: No Does the patient have difficulty concentrating, remembering, or making decisions?: No Does the patient have difficulty walking or climbing stairs?: Yes(Fatigue with walking long  distances) Does the patient have difficulty dressing or bathing?: No Does the patient have difficulty doing errands alone such as visiting a doctor's office or  shopping?: Yes(Does not drive)    Assessment & Plan  1. Mucopurulent chronic bronchitis (HCC)  - albuterol (PROAIR HFA) 108 (90 Base) MCG/ACT inhaler; TAKE 2 PUFFS BY MOUTH EVERY 6 HOURS AS NEEDED FOR WHEEZE OR SHORTNESS OF BREATH  Dispense: 8.5 Inhaler; Refill: 0 - Fluticasone-Umeclidin-Vilant (TRELEGY ELLIPTA) 100-62.5-25 MCG/INH AEPB; Inhale 1 puff into the lungs daily.  Dispense: 60 each; Refill: 5  2. Pre-diabetes  - POCT HgB A1C was 6.5 % today but no previous level above 6.5% or fasting above 126, discussed very close to becoming a diabetic, we will give her dietary information , discussed starting medication but she wants to hold off for now   3. Dysuria  - POCT urinalysis dipstick  4. Bladder spasms  - POCT urinalysis dipstick  5. GERD without esophagitis  - pantoprazole (PROTONIX) 40 MG tablet; Take 1 tablet (40 mg total) by mouth daily at 6 (six) AM.  Dispense: 30 tablet; Refill: 5  6. Chronic constipation  - lubiprostone (AMITIZA) 24 MCG capsule; Take 1 capsule (24 mcg total) by mouth daily with breakfast.  Dispense: 30 capsule; Refill: 5  7. Hypothyroidism due to medication  - levothyroxine (SYNTHROID, LEVOTHROID) 75 MCG tablet; Take 1 tablet (75 mcg total) by mouth daily at 6 (six) AM.  Dispense: 30 tablet; Refill: 5  8. Essential hypertension  - hydrochlorothiazide (HYDRODIURIL) 12.5 MG tablet; Take 1 tablet (12.5 mg total) by mouth daily.  Dispense: 30 tablet; Refill: 5  9. Bipolar I disorder, most recent episode (or current) manic ([place])  Continue follow up with psychiatrist   10. Chronic right-sided low back pain with right-sided sciatica  She wants to see chiropractor first, if no improvement she will return, currently pain localized to right lower back, not radiating today

## 2019-01-02 NOTE — Patient Instructions (Signed)
Diabetes Mellitus and Nutrition, Adult  When you have diabetes (diabetes mellitus), it is very important to have healthy eating habits because your blood sugar (glucose) levels are greatly affected by what you eat and drink. Eating healthy foods in the appropriate amounts, at about the same times every day, can help you:  · Control your blood glucose.  · Lower your risk of heart disease.  · Improve your blood pressure.  · Reach or maintain a healthy weight.  Every person with diabetes is different, and each person has different needs for a meal plan. Your health care provider may recommend that you work with a diet and nutrition specialist (dietitian) to make a meal plan that is best for you. Your meal plan may vary depending on factors such as:  · The calories you need.  · The medicines you take.  · Your weight.  · Your blood glucose, blood pressure, and cholesterol levels.  · Your activity level.  · Other health conditions you have, such as heart or kidney disease.  How do carbohydrates affect me?  Carbohydrates, also called carbs, affect your blood glucose level more than any other type of food. Eating carbs naturally raises the amount of glucose in your blood. Carb counting is a method for keeping track of how many carbs you eat. Counting carbs is important to keep your blood glucose at a healthy level, especially if you use insulin or take certain oral diabetes medicines.  It is important to know how many carbs you can safely have in each meal. This is different for every person. Your dietitian can help you calculate how many carbs you should have at each meal and for each snack.  Foods that contain carbs include:  · Bread, cereal, rice, pasta, and crackers.  · Potatoes and corn.  · Peas, beans, and lentils.  · Milk and yogurt.  · Fruit and juice.  · Desserts, such as cakes, cookies, ice cream, and candy.  How does alcohol affect me?  Alcohol can cause a sudden decrease in blood glucose (hypoglycemia),  especially if you use insulin or take certain oral diabetes medicines. Hypoglycemia can be a life-threatening condition. Symptoms of hypoglycemia (sleepiness, dizziness, and confusion) are similar to symptoms of having too much alcohol.  If your health care provider says that alcohol is safe for you, follow these guidelines:  · Limit alcohol intake to no more than 1 drink per day for nonpregnant women and 2 drinks per day for men. One drink equals 12 oz of beer, 5 oz of wine, or 1½ oz of hard liquor.  · Do not drink on an empty stomach.  · Keep yourself hydrated with water, diet soda, or unsweetened iced tea.  · Keep in mind that regular soda, juice, and other mixers may contain a lot of sugar and must be counted as carbs.  What are tips for following this plan?    Reading food labels  · Start by checking the serving size on the "Nutrition Facts" label of packaged foods and drinks. The amount of calories, carbs, fats, and other nutrients listed on the label is based on one serving of the item. Many items contain more than one serving per package.  · Check the total grams (g) of carbs in one serving. You can calculate the number of servings of carbs in one serving by dividing the total carbs by 15. For example, if a food has 30 g of total carbs, it would be equal to 2   servings of carbs.  · Check the number of grams (g) of saturated and trans fats in one serving. Choose foods that have low or no amount of these fats.  · Check the number of milligrams (mg) of salt (sodium) in one serving. Most people should limit total sodium intake to less than 2,300 mg per day.  · Always check the nutrition information of foods labeled as "low-fat" or "nonfat". These foods may be higher in added sugar or refined carbs and should be avoided.  · Talk to your dietitian to identify your daily goals for nutrients listed on the label.  Shopping  · Avoid buying canned, premade, or processed foods. These foods tend to be high in fat, sodium,  and added sugar.  · Shop around the outside edge of the grocery store. This includes fresh fruits and vegetables, bulk grains, fresh meats, and fresh dairy.  Cooking  · Use low-heat cooking methods, such as baking, instead of high-heat cooking methods like deep frying.  · Cook using healthy oils, such as olive, canola, or sunflower oil.  · Avoid cooking with butter, cream, or high-fat meats.  Meal planning  · Eat meals and snacks regularly, preferably at the same times every day. Avoid going long periods of time without eating.  · Eat foods high in fiber, such as fresh fruits, vegetables, beans, and whole grains. Talk to your dietitian about how many servings of carbs you can eat at each meal.  · Eat 4-6 ounces (oz) of lean protein each day, such as lean meat, chicken, fish, eggs, or tofu. One oz of lean protein is equal to:  ? 1 oz of meat, chicken, or fish.  ? 1 egg.  ? ¼ cup of tofu.  · Eat some foods each day that contain healthy fats, such as avocado, nuts, seeds, and fish.  Lifestyle  · Check your blood glucose regularly.  · Exercise regularly as told by your health care provider. This may include:  ? 150 minutes of moderate-intensity or vigorous-intensity exercise each week. This could be brisk walking, biking, or water aerobics.  ? Stretching and doing strength exercises, such as yoga or weightlifting, at least 2 times a week.  · Take medicines as told by your health care provider.  · Do not use any products that contain nicotine or tobacco, such as cigarettes and e-cigarettes. If you need help quitting, ask your health care provider.  · Work with a counselor or diabetes educator to identify strategies to manage stress and any emotional and social challenges.  Questions to ask a health care provider  · Do I need to meet with a diabetes educator?  · Do I need to meet with a dietitian?  · What number can I call if I have questions?  · When are the best times to check my blood glucose?  Where to find more  information:  · American Diabetes Association: diabetes.org  · Academy of Nutrition and Dietetics: www.eatright.org  · National Institute of Diabetes and Digestive and Kidney Diseases (NIH): www.niddk.nih.gov  Summary  · A healthy meal plan will help you control your blood glucose and maintain a healthy lifestyle.  · Working with a diet and nutrition specialist (dietitian) can help you make a meal plan that is best for you.  · Keep in mind that carbohydrates (carbs) and alcohol have immediate effects on your blood glucose levels. It is important to count carbs and to use alcohol carefully.  This information is not intended to   replace advice given to you by your health care provider. Make sure you discuss any questions you have with your health care provider.  Document Released: 09/07/2005 Document Revised: 07/11/2017 Document Reviewed: 01/15/2017  Elsevier Interactive Patient Education © 2019 Elsevier Inc.

## 2019-01-02 NOTE — Telephone Encounter (Signed)
Called patient but no vm  Will try back later [facility] app over to sninsky per his request  Sharyn Lull

## 2019-01-06 ENCOUNTER — Encounter: Payer: Self-pay | Admitting: Urology

## 2019-01-06 ENCOUNTER — Ambulatory Visit: Payer: Self-pay | Admitting: Urology

## 2019-01-06 ENCOUNTER — Ambulatory Visit (INDEPENDENT_AMBULATORY_CARE_PROVIDER_SITE_OTHER): Payer: Medicare Other | Admitting: Urology

## 2019-01-06 VITALS — BP 115/74 | HR 92 | Ht 59.0 in | Wt 151.0 lb

## 2019-01-06 DIAGNOSIS — N390 Urinary tract infection, site not specified: Secondary | ICD-10-CM | POA: Diagnosis not present

## 2019-01-06 DIAGNOSIS — M5441 Lumbago with sciatica, right side: Secondary | ICD-10-CM | POA: Diagnosis not present

## 2019-01-06 DIAGNOSIS — N200 Calculus of kidney: Secondary | ICD-10-CM | POA: Diagnosis not present

## 2019-01-06 DIAGNOSIS — M545 Low back pain: Secondary | ICD-10-CM | POA: Diagnosis not present

## 2019-01-06 DIAGNOSIS — M9903 Segmental and somatic dysfunction of lumbar region: Secondary | ICD-10-CM | POA: Diagnosis not present

## 2019-01-06 DIAGNOSIS — M5432 Sciatica, left side: Secondary | ICD-10-CM | POA: Diagnosis not present

## 2019-01-06 LAB — MICROSCOPIC EXAMINATION: WBC UA: NONE SEEN /HPF (ref 0–5)

## 2019-01-06 LAB — URINALYSIS, COMPLETE
BILIRUBIN UA: NEGATIVE
Glucose, UA: NEGATIVE
Ketones, UA: NEGATIVE
NITRITE UA: NEGATIVE
PH UA: 7 (ref 5.0–7.5)
Specific Gravity, UA: 1.02 (ref 1.005–1.030)
UUROB: 0.2 mg/dL (ref 0.2–1.0)

## 2019-01-06 MED ORDER — OXYBUTYNIN CHLORIDE ER 10 MG PO TB24: 10.0000 mg | ORAL_TABLET | Freq: Every day | ORAL | 3 refills | Status: DC

## 2019-01-06 NOTE — Progress Notes (Signed)
   01/06/2019 11:30 AM   Park Breed Dolinar [birth year] [id number]  Reason for visit: Follow up L URS  HPI: I saw April Luna back in urology clinic for follow-up after left ureteroscopy.  On 11/08/2018 she underwent a left ureteroscopy, laser lithotripsy, and stent placement for a 1.8 cm UPJ stone.  Her stent has since been removed.  She denies any significant complaints today including gross hematuria, dysuria, flank pain.  She does report some urinary frequency and a urine was sent for culture to rule out UTI.  Follow-up renal ultrasound to rule out silent hydronephrosis showed resolution of her prior severe hydronephrosis on CT 09/19/2018, no significant residual fragments.   ROS: Please see flowsheet from today's date for complete review of systems.  Physical Exam: BP 115/74   Pulse 92   Ht 4\' 11"  (1.499 m)   Wt 151 lb (68.5 kg)   BMI 30.50 kg/m    Constitutional:  Alert and oriented, No acute distress. Respiratory: Normal respiratory effort, no increased work of breathing. GI: Abdomen is soft, nontender, nondistended, no abdominal masses GU: No CVA tenderness Skin: No rashes, bruises or suspicious lesions. Neurologic: Grossly intact, no focal deficits, moving all 4 extremities. Psychiatric: Normal mood and affect  Laboratory Data: Urine culture pending  Pertinent Imaging: Personally reviewed the renal ultrasound dated 01/02/2019.  Resolution of prior left hydronephrosis, no significant residual fragments.  Assessment & Plan:   In summary, patient is a 48 year old female status post left ureteroscopy, laser lithotripsy for a large left 1.8 cm UPJ stone.  Her follow-up ultrasound shows resolution of her prior hydronephrosis, no significant residual fragments.  Stone analysis was 65% calcium oxalate, and 35% calcium phosphate.  She does report oxybutynin for her stent related symptoms significantly improved her urgency and urge incontinence, and she would like to resume this  medication.  We discussed general stone prevention strategies including adequate hydration with goal of producing 2.5 L of urine daily, increasing citric acid intake, increasing calcium intake during high oxalate meals, minimizing animal protein, and decreasing salt intake. Information about dietary recommendations given today.   Follow-up urine culture to rule out UTI RTC 6 months for KUB  A total of 10 minutes were spent face-to-face with the patient, greater than 50% was spent in patient education, counseling, and coordination of care regarding nephrolithiasis, post-op expectations, and stone prevention.   Billey Co, [facility] [address] [place], [place] [address] [phone]

## 2019-01-10 ENCOUNTER — Ambulatory Visit (INDEPENDENT_AMBULATORY_CARE_PROVIDER_SITE_OTHER): Payer: Medicare Other | Admitting: Nurse Practitioner

## 2019-01-10 ENCOUNTER — Encounter: Payer: Self-pay | Admitting: Nurse Practitioner

## 2019-01-10 VITALS — BP 118/76 | HR 99 | Temp 98.4°F | Resp 16 | Ht 59.0 in | Wt 153.2 lb

## 2019-01-10 DIAGNOSIS — R682 Dry mouth, unspecified: Secondary | ICD-10-CM | POA: Diagnosis not present

## 2019-01-10 DIAGNOSIS — B370 Candidal stomatitis: Secondary | ICD-10-CM | POA: Diagnosis not present

## 2019-01-10 DIAGNOSIS — K117 Disturbances of salivary secretion: Secondary | ICD-10-CM

## 2019-01-10 DIAGNOSIS — H9202 Otalgia, left ear: Secondary | ICD-10-CM

## 2019-01-10 DIAGNOSIS — H6123 Impacted cerumen, bilateral: Secondary | ICD-10-CM | POA: Diagnosis not present

## 2019-01-10 LAB — CULTURE, URINE COMPREHENSIVE

## 2019-01-10 MED ORDER — NYSTATIN 100000 UNIT/ML MT SUSP: 5.0000 mL | Freq: Four times a day (QID) | OROMUCOSAL | 0 refills | Status: DC

## 2019-01-10 NOTE — Patient Instructions (Addendum)
-  Make sure to rinse your mouth out well after using your inhalers every time. Do warm salt water gargles when you see white areas on your tongue.  -Take nystatin medications 4 times a day. Take this medicine by placing one-half of the dose in each side of your mouth. Hold the medicine in your mouth or swish it around in your mouth for as long as possible, then gargle and swallow.  -If your symptoms have not improved or resolved in 1-2 weeks please let us know so we can refer you to a specialist.   Oral Thrush, Adult Oral thrush is an infection in your mouth and throat. It causes white patches on your tongue and in your mouth. Follow these instructions at home: Helping with soreness  To lessen your pain: ? Drink cold liquids, like water and iced tea. ? Eat frozen ice pops or frozen juices. ? Eat foods that are easy to swallow, like gelatin and ice cream. ? Drink from a straw if the patches in your mouth are painful. General instructions  Take or use over-the-counter and prescription medicines only as told by your doctor. Medicine for oral thrush may be something to swallow, or it may be something to put on the infected area.  Eat plain yogurt that has live cultures in it. Read the label to make sure.  If you wear dentures: ? Take out your dentures before you go to bed. ? Brush them well. ? Soak them in a denture cleaner.  Rinse your mouth with warm salt-water many times a day. To make the salt-water mixture, completely dissolve 1/2-1 teaspoon of salt in 1 cup of warm water. Contact a doctor if:  Your problems are getting worse.  Your problems do not get better in less than 7 days with treatment.  Your infection is spreading. This may show as white patches on the skin outside of your mouth.  You are nursing your baby and you have redness and pain in the nipples. This information is not intended to replace advice given to you by your health care provider. Make sure you discuss any  questions you have with your health care provider. Document Released: 03/07/2010 Document Revised: 09/04/2016 Document Reviewed: 09/04/2016 Elsevier Interactive Patient Education  [employer].

## 2019-01-10 NOTE — Progress Notes (Signed)
Name: April Luna   MRN: [id number]    DOB: 12-10-1971   Date:01/10/2019       Progress Note  Subjective  Chief Complaint  Chief Complaint  Patient presents with  . Thrush    tonhue stiff, thirst, scaley  . Ear Pain    left    HPI  Patient endorses dry mouth, white plaques on her tongue and pain. This has been going on for one month intermittently. States her tongue feels like a cats tongue. States has been on frequent antibiotics for the past years. Was on Anoro (no steroid) but recently switched to Trelegy recently but has been rinsing her mouth well. Has been using oragel toothache rinse, and using dry mouth mouthwash. Denies polyuria, polydipsia, polyphagia, dry skin, dry eyes, or other dry mucous membranes  Also notes left ear fullness and decreased hearing.   Patient Active Problem List   Diagnosis Date Noted  . ASCUS of cervix with negative high risk HPV 09/05/2018  . Pre-diabetes 06/05/2018  . GERD without esophagitis 04/11/2018  . Hyperlipidemia 04/11/2018  . Hypertension 04/11/2018  . Hypothyroidism 04/26/2015  . Bipolar I disorder, most recent episode (or current) manic ([place]) 04/25/2015  . Delirium due to another medical condition 04/25/2015  . Cannabis abuse 04/25/2015    Past Medical History:  Diagnosis Date  . Allergy    pollen  . Anxiety   . Bipolar 1 disorder ([place])   . COPD (chronic obstructive pulmonary disease) ([place])   . Depression   . Family history of adverse reaction to anesthesia    grand father had a stroke during anesthesia  . GERD (gastroesophageal reflux disease)   . History of kidney stones   . Hyperlipidemia   . Hypertension   . Hypothyroidism   . Pneumonia     Past Surgical History:  Procedure Laterality Date  . CESAREAN SECTION    . CYSTOSCOPY W/ RETROGRADES Left 11/08/2018   Procedure: CYSTOSCOPY WITH RETROGRADE PYELOGRAM;  Surgeon: Billey Co, MD;  Location: [facility] ORS;  Service: Urology;  Laterality: Left;  .  CYSTOSCOPY/URETEROSCOPY/HOLMIUM LASER/STENT PLACEMENT Left 11/08/2018   Procedure: CYSTOSCOPY/URETEROSCOPY/HOLMIUM LASER/STENT PLACEMENT;  Surgeon: Billey Co, MD;  Location: [facility] ORS;  Service: Urology;  Laterality: Left;  . MOUTH SURGERY     wisdom teeth extraction    Social History   Tobacco Use  . Smoking status: Current Every Day Smoker    Packs/day: 1.00    Years: 34.00    Pack years: 34.00    Types: Cigarettes    Start date: 05/13/1985  . Smokeless tobacco: Never Used  Substance Use Topics  . Alcohol use: Not Currently    Alcohol/week: 0.0 standard drinks    Comment: rarely     Current Outpatient Medications:  .  albuterol (PROAIR HFA) 108 (90 Base) MCG/ACT inhaler, TAKE 2 PUFFS BY MOUTH EVERY 6 HOURS AS NEEDED FOR WHEEZE OR SHORTNESS OF BREATH, Disp: 8.5 Inhaler, Rfl: 0 .  amantadine (SYMMETREL) 100 MG capsule, Take 100 mg by mouth 2 (two) times daily. , Disp: , Rfl:  .  buPROPion (WELLBUTRIN XL) 150 MG 24 hr tablet, Take 150 mg by mouth daily. , Disp: , Rfl:  .  Cholecalciferol (VITAMIN D-3) 125 MCG (5000 UT) TABS, Take 5,000 Units by mouth daily., Disp: , Rfl:  .  Fluticasone-Umeclidin-Vilant (TRELEGY ELLIPTA) 100-62.5-25 MCG/INH AEPB, Inhale 1 puff into the lungs daily., Disp: 60 each, Rfl: 5 .  gabapentin (NEURONTIN) 300 MG capsule, Take 300 mg by mouth  2 (two) times daily., Disp: , Rfl:  .  hydrochlorothiazide (HYDRODIURIL) 12.5 MG tablet, Take 1 tablet (12.5 mg total) by mouth daily., Disp: 30 tablet, Rfl: 5 .  hydrOXYzine (VISTARIL) 50 MG capsule, Take 50 mg by mouth 2 (two) times daily as needed for anxiety., Disp: , Rfl:  .  levothyroxine (SYNTHROID, LEVOTHROID) 75 MCG tablet, Take 1 tablet (75 mcg total) by mouth daily at 6 (six) AM., Disp: 30 tablet, Rfl: 5 .  lubiprostone (AMITIZA) 24 MCG capsule, Take 1 capsule (24 mcg total) by mouth daily with breakfast., Disp: 30 capsule, Rfl: 5 .  Paliperidone Palmitate ER (INVEGA TRINZA) 819 MG/2.625ML SUSY, Inject 819  mg into the muscle every 3 (three) months., Disp: , Rfl:  .  pantoprazole (PROTONIX) 40 MG tablet, Take 1 tablet (40 mg total) by mouth daily at 6 (six) AM., Disp: 30 tablet, Rfl: 5 .  prazosin (MINIPRESS) 2 MG capsule, Take 2 mg by mouth at bedtime., Disp: , Rfl:  .  rosuvastatin (CRESTOR) 20 MG tablet, Take 1 tablet (20 mg total) by mouth daily. In place of pravastatin (Patient taking differently: Take 20 mg by mouth at bedtime. In place of pravastatin), Disp: 30 tablet, Rfl: 5 .  sertraline (ZOLOFT) 100 MG tablet, Take 200 mg by mouth daily. , Disp: , Rfl:  .  nystatin (MYCOSTATIN) 100000 UNIT/ML suspension, Take 5 mLs (500,000 Units total) by mouth 4 (four) times daily., Disp: 280 mL, Rfl: 0 .  oxybutynin (DITROPAN-XL) 10 MG 24 hr tablet, Take 1 tablet (10 mg total) by mouth daily. (Patient not taking: Reported on 01/10/2019), Disp: 90 tablet, Rfl: 3  Allergies  Allergen Reactions  . Penicillins Other (See Comments)    GI distress Has patient had a PCN reaction causing immediate rash, facial/tongue/throat swelling, SOB or lightheadedness with hypotension: No Has patient had a PCN reaction causing severe rash involving mucus membranes or skin necrosis: No Has patient had a PCN reaction that required hospitalization: No Has patient had a PCN reaction occurring within the last 10 years: No If all of the above answers are "NO", then may proceed with Cephalosporin use.   Marland Kitchen Perphenazine Other (See Comments)    Tremors, muscle weakness, tongue swelling  . Sulfa Antibiotics Other (See Comments)    GI distress   . Abilify [Aripiprazole] Rash    ROS   No other specific complaints in a complete review of systems (except as listed in HPI above).  Objective  Vitals:   01/10/19 1118  BP: 118/76  Pulse: 99  Resp: 16  Temp: 98.4 F (36.9 C)  TempSrc: Oral  SpO2: 93%  Weight: 153 lb 3.2 oz (69.5 kg)  Height: 4\' 11"  (1.499 m)    Body mass index is 30.94 kg/m.  Nursing Note and Vital  Signs reviewed.  Physical Exam Constitutional:      Appearance: Normal appearance.  HENT:     Head: Normocephalic and atraumatic.     Right Ear: Ear canal normal. There is impacted cerumen.     Left Ear: Ear canal normal. There is impacted cerumen.     Ears:     Comments: Right ear clear after irrigation. Left ear removed dried skin- still difficult to visualize TM likely due to peroxide mix used for irrigation- no exudate or tenderness noted     Mouth/Throat:     Mouth: Mucous membranes are dry.  Eyes:     General:        Right eye: No discharge.  Left eye: No discharge.     Conjunctiva/sclera: Conjunctivae normal.  Pulmonary:     Effort: Pulmonary effort is normal.  Musculoskeletal: Normal range of motion.  Skin:    General: Skin is warm.     Comments: Moisturized skin  Neurological:     General: No focal deficit present.     Mental Status: She is alert.  Psychiatric:        Mood and Affect: Mood normal.        Thought Content: Thought content normal.       No results found for this or any previous visit (from the past 48 hour(s)).  Assessment & Plan  1. Oral thrush If not resolved in 2 weeks will send to ENT. Possibly caused by frequent antibiotic use.  - nystatin (MYCOSTATIN) 100000 UNIT/ML suspension; Take 5 mLs (500,000 Units total) by mouth 4 (four) times daily.  Dispense: 280 mL; Refill: 0  2. Xerostomia Considered caused by thrush, diabetes, sjogrens,- A1C last week well controlled, no dry eyes. Plan to treat thrush like thrush if unimproved she will call and will refer to ENT - nystatin (MYCOSTATIN) 100000 UNIT/ML suspension; Take 5 mLs (500,000 Units total) by mouth 4 (four) times daily.  Dispense: 280 mL; Refill: 0  3. Bilateral impacted cerumen - Ear Lavage  4. Otalgia, left Discussed s&s of ear infection and to come in for re-evaluation if present. Will re-look at 2 week followup

## 2019-01-28 ENCOUNTER — Other Ambulatory Visit: Payer: Self-pay

## 2019-01-28 ENCOUNTER — Ambulatory Visit (INDEPENDENT_AMBULATORY_CARE_PROVIDER_SITE_OTHER): Payer: Medicare Other | Admitting: Nurse Practitioner

## 2019-01-28 ENCOUNTER — Encounter: Payer: Self-pay | Admitting: Nurse Practitioner

## 2019-01-28 VITALS — BP 110/68 | HR 88 | Temp 98.4°F | Resp 14 | Ht 59.0 in | Wt 153.8 lb

## 2019-01-28 DIAGNOSIS — R809 Proteinuria, unspecified: Secondary | ICD-10-CM | POA: Diagnosis not present

## 2019-01-28 DIAGNOSIS — R319 Hematuria, unspecified: Secondary | ICD-10-CM | POA: Diagnosis not present

## 2019-01-28 DIAGNOSIS — K1379 Other lesions of oral mucosa: Secondary | ICD-10-CM | POA: Diagnosis not present

## 2019-01-28 DIAGNOSIS — H9202 Otalgia, left ear: Secondary | ICD-10-CM | POA: Diagnosis not present

## 2019-01-28 LAB — POCT URINALYSIS DIPSTICK
BILIRUBIN UA: NEGATIVE
Glucose, UA: NEGATIVE
KETONES UA: NEGATIVE
Leukocytes, UA: NEGATIVE
Nitrite, UA: NEGATIVE
Protein, UA: POSITIVE — AB
Spec Grav, UA: 1.01 (ref 1.010–1.025)
Urobilinogen, UA: NEGATIVE E.U./dL — AB
pH, UA: 5 (ref 5.0–8.0)

## 2019-01-28 NOTE — Patient Instructions (Signed)
-   Please call your urologist to let them know what you have both blood and protein in your urine and the symptoms you are having you can be evaluated - I am sending a referral to ENT

## 2019-01-28 NOTE — Progress Notes (Addendum)
Name: April Luna   MRN: [id number]    DOB: 1971/03/31   Date:02/04/2019       Progress Note  Subjective  Chief Complaint  Chief Complaint  Patient presents with  . Follow-up    recheckon thrush  . Urinary Tract Infection    HPI  Patient presents for follow-up for oral pain and white plaque on tongue.  Patient was treated with nystatin and has been taking as prescribed notes still painful, unable to visualize white plaques at this time.  Patient also endorses foam in urine feels like she has a UTI, notes urinary frequency.  Sees urology but has not made an appointment.  Last point-of-care urine showed trace blood and protein and trace leukocytes.  Patient also notes left ear pain and crackling in her left ear.  Attempted to irrigate last visit without success.  Patient Active Problem List   Diagnosis Date Noted  . ASCUS of cervix with negative high risk HPV 09/05/2018  . Pre-diabetes 06/05/2018  . GERD without esophagitis 04/11/2018  . Hyperlipidemia 04/11/2018  . Hypertension 04/11/2018  . Hypothyroidism 04/26/2015  . Bipolar I disorder, most recent episode (or current) manic ([place]) 04/25/2015  . Delirium due to another medical condition 04/25/2015  . Cannabis abuse 04/25/2015    Past Medical History:  Diagnosis Date  . Allergy    pollen  . Anxiety   . Bipolar 1 disorder ([place])   . COPD (chronic obstructive pulmonary disease) ([place])   . Depression   . Family history of adverse reaction to anesthesia    grand father had a stroke during anesthesia  . GERD (gastroesophageal reflux disease)   . History of kidney stones   . Hyperlipidemia   . Hypertension   . Hypothyroidism   . Pneumonia     Past Surgical History:  Procedure Laterality Date  . CESAREAN SECTION    . CYSTOSCOPY W/ RETROGRADES Left 11/08/2018   Procedure: CYSTOSCOPY WITH RETROGRADE PYELOGRAM;  Surgeon: Billey Co, MD;  Location: [facility];  Service: Urology;  Laterality: Left;  .  CYSTOSCOPY/URETEROSCOPY/HOLMIUM LASER/STENT PLACEMENT Left 11/08/2018   Procedure: CYSTOSCOPY/URETEROSCOPY/HOLMIUM LASER/STENT PLACEMENT;  Surgeon: Billey Co, MD;  Location: [facility];  Service: Urology;  Laterality: Left;  . MOUTH SURGERY     wisdom teeth extraction    Social History   Tobacco Use  . Smoking status: Current Every Day Smoker    Packs/day: 1.00    Years: 34.00    Pack years: 34.00    Types: Cigarettes    Start date: 05/13/1985  . Smokeless tobacco: Never Used  Substance Use Topics  . Alcohol use: Not Currently    Alcohol/week: 0.0 standard drinks    Comment: rarely     Current Outpatient Medications:  .  amantadine (SYMMETREL) 100 MG capsule, Take 100 mg by mouth 2 (two) times daily. , Disp: , Rfl:  .  buPROPion (WELLBUTRIN XL) 150 MG 24 hr tablet, Take 150 mg by mouth daily. , Disp: , Rfl:  .  Cholecalciferol (VITAMIN D-3) 125 MCG (5000 UT) TABS, Take 5,000 Units by mouth daily., Disp: , Rfl:  .  Fluticasone-Umeclidin-Vilant (TRELEGY ELLIPTA) 100-62.5-25 MCG/INH AEPB, Inhale 1 puff into the lungs daily., Disp: 60 each, Rfl: 5 .  gabapentin (NEURONTIN) 300 MG capsule, Take 300 mg by mouth 2 (two) times daily., Disp: , Rfl:  .  hydrochlorothiazide (HYDRODIURIL) 12.5 MG tablet, Take 1 tablet (12.5 mg total) by mouth daily., Disp: 30 tablet, Rfl: 5 .  hydrOXYzine (VISTARIL)  50 MG capsule, Take 50 mg by mouth 2 (two) times daily as needed for anxiety., Disp: , Rfl:  .  levothyroxine (SYNTHROID, LEVOTHROID) 75 MCG tablet, Take 1 tablet (75 mcg total) by mouth daily at 6 (six) AM., Disp: 30 tablet, Rfl: 5 .  lubiprostone (AMITIZA) 24 MCG capsule, Take 1 capsule (24 mcg total) by mouth daily with breakfast., Disp: 30 capsule, Rfl: 5 .  nystatin (MYCOSTATIN) 100000 UNIT/ML suspension, Take 5 mLs (500,000 Units total) by mouth 4 (four) times daily., Disp: 280 mL, Rfl: 0 .  Paliperidone Palmitate ER (INVEGA TRINZA) 819 MG/2.625ML SUSY, Inject 819 mg into the muscle every  3 (three) months., Disp: , Rfl:  .  pantoprazole (PROTONIX) 40 MG tablet, Take 1 tablet (40 mg total) by mouth daily at 6 (six) AM., Disp: 30 tablet, Rfl: 5 .  prazosin (MINIPRESS) 2 MG capsule, Take 2 mg by mouth at bedtime., Disp: , Rfl:  .  rosuvastatin (CRESTOR) 20 MG tablet, Take 1 tablet (20 mg total) by mouth daily. In place of pravastatin (Patient taking differently: Take 20 mg by mouth at bedtime. In place of pravastatin), Disp: 30 tablet, Rfl: 5 .  sertraline (ZOLOFT) 100 MG tablet, Take 200 mg by mouth daily. , Disp: , Rfl:  .  albuterol (PROAIR HFA) 108 (90 Base) MCG/ACT inhaler, TAKE 2 PUFFS BY MOUTH EVERY 6 HOURS AS NEEDED FOR WHEEZE OR SHORTNESS OF BREATH, Disp: 8.5 Inhaler, Rfl: 0 .  lisinopril (PRINIVIL,ZESTRIL) 5 MG tablet, Take 1 tablet (5 mg total) by mouth daily., Disp: 30 tablet, Rfl: 0 .  oxybutynin (DITROPAN-XL) 10 MG 24 hr tablet, Take 1 tablet (10 mg total) by mouth daily. (Patient not taking: Reported on 01/10/2019), Disp: 90 tablet, Rfl: 3  Allergies  Allergen Reactions  . Penicillins Other (See Comments)    GI distress Has patient had a PCN reaction causing immediate rash, facial/tongue/throat swelling, SOB or lightheadedness with hypotension: No Has patient had a PCN reaction causing severe rash involving mucus membranes or skin necrosis: No Has patient had a PCN reaction that required hospitalization: No Has patient had a PCN reaction occurring within the last 10 years: No If all of the above answers are "NO", then may proceed with Cephalosporin use.   Marland Kitchen Perphenazine Other (See Comments)    Tremors, muscle weakness, tongue swelling  . Sulfa Antibiotics Other (See Comments)    GI distress   . Abilify [Aripiprazole] Rash    ROS  No other specific complaints in a complete review of systems (except as listed in HPI above).  Objective  Vitals:   01/28/19 1037  BP: 110/68  Pulse: 88  Resp: 14  Temp: 98.4 F (36.9 C)  TempSrc: Oral  SpO2: 93%  Weight:  153 lb 12.8 oz (69.8 kg)  Height: 4\' 11"  (1.499 m)    Body mass index is 31.06 kg/m.  Nursing Note and Vital Signs reviewed.  Physical Exam Constitutional:      Appearance: Normal appearance.  HENT:     Head: Normocephalic and atraumatic.     Right Ear: Hearing, tympanic membrane and ear canal normal.     Left Ear: Hearing normal. A foreign body is present.     Ears:     Comments: Left eardrum difficult to visualize possible scarring vs white foreign body/cerumen.  Eyes:     Conjunctiva/sclera: Conjunctivae normal.  Cardiovascular:     Rate and Rhythm: Normal rate.  Pulmonary:     Effort: Pulmonary effort is normal.  Abdominal:     Tenderness: There is no right CVA tenderness or left CVA tenderness.  Skin:    General: Skin is warm and dry.  Neurological:     General: No focal deficit present.     Mental Status: She is alert and oriented to person, place, and time.  Psychiatric:        Mood and Affect: Mood normal.        Behavior: Behavior normal.       No results found for this or any previous visit (from the past 48 hour(s)).  Assessment & Plan  1. Proteinuria, unspecified type Patient will call urologist  - POCT urinalysis dipstick - Urine Microalbumin w/creat. ratio  2. Hematuria, unspecified type Please see urology  - POCT urinalysis dipstick - Urinalysis, Routine w reflex microscopic  3. Oral pain Possible thrush  - Ambulatory referral to ENT  4. Left ear pain - Ambulatory referral to ENT Despite white plaques resolving patient still endorses significant oral pain and states she feels that they are still there will send to ENT for further eval.  Patient also endorses left ear pain this is chronic issue last visit we attempted to irrigate ear with minimal success.  Will send to ENT for further eval.  Patient has hematuria and proteinuria in the last 2 dipsticks will send off for microalbumin ratio and urinalysis with microscopic to evaluate blood.   Informed patient to call urologist today to schedule follow-up.

## 2019-01-29 ENCOUNTER — Other Ambulatory Visit: Payer: Self-pay | Admitting: Nurse Practitioner

## 2019-01-29 DIAGNOSIS — R809 Proteinuria, unspecified: Principal | ICD-10-CM

## 2019-01-29 DIAGNOSIS — E1129 Type 2 diabetes mellitus with other diabetic kidney complication: Secondary | ICD-10-CM

## 2019-01-29 LAB — URINALYSIS, ROUTINE W REFLEX MICROSCOPIC
Bilirubin Urine: NEGATIVE
Glucose, UA: NEGATIVE
Hgb urine dipstick: NEGATIVE
KETONES UR: NEGATIVE
LEUKOCYTES UA: NEGATIVE
NITRITE: NEGATIVE
Protein, ur: NEGATIVE
SPECIFIC GRAVITY, URINE: 1.01 (ref 1.001–1.03)
pH: 5.5 (ref 5.0–8.0)

## 2019-01-29 LAB — MICROALBUMIN / CREATININE URINE RATIO
CREATININE, URINE: 56 mg/dL (ref 20–275)
Microalb Creat Ratio: 96 mcg/mg creat — ABNORMAL HIGH (ref <30)
Microalb, Ur: 5.4 mg/dL

## 2019-01-29 MED ORDER — LISINOPRIL 5 MG PO TABS: 5.0000 mg | ORAL_TABLET | Freq: Every day | ORAL | 0 refills | Status: DC

## 2019-01-30 ENCOUNTER — Other Ambulatory Visit: Payer: Self-pay | Admitting: Family Medicine

## 2019-01-30 DIAGNOSIS — J411 Mucopurulent chronic bronchitis: Secondary | ICD-10-CM

## 2019-01-31 ENCOUNTER — Other Ambulatory Visit: Payer: Self-pay | Admitting: *Deleted

## 2019-01-31 DIAGNOSIS — R809 Proteinuria, unspecified: Principal | ICD-10-CM

## 2019-01-31 DIAGNOSIS — E1129 Type 2 diabetes mellitus with other diabetic kidney complication: Secondary | ICD-10-CM

## 2019-01-31 MED ORDER — LISINOPRIL 5 MG PO TABS: 5.0000 mg | ORAL_TABLET | Freq: Every day | ORAL | 0 refills | Status: DC

## 2019-02-04 ENCOUNTER — Encounter: Payer: Self-pay | Admitting: Nurse Practitioner

## 2019-02-04 ENCOUNTER — Ambulatory Visit (INDEPENDENT_AMBULATORY_CARE_PROVIDER_SITE_OTHER): Payer: Medicare Other | Admitting: Nurse Practitioner

## 2019-02-04 ENCOUNTER — Ambulatory Visit (INDEPENDENT_AMBULATORY_CARE_PROVIDER_SITE_OTHER): Payer: Medicare Other

## 2019-02-04 VITALS — BP 110/80 | HR 98 | Temp 98.3°F | Resp 18 | Ht 59.0 in | Wt 157.2 lb

## 2019-02-04 DIAGNOSIS — J411 Mucopurulent chronic bronchitis: Secondary | ICD-10-CM

## 2019-02-04 DIAGNOSIS — E1129 Type 2 diabetes mellitus with other diabetic kidney complication: Secondary | ICD-10-CM | POA: Diagnosis not present

## 2019-02-04 DIAGNOSIS — Z0000 Encounter for general adult medical examination without abnormal findings: Secondary | ICD-10-CM | POA: Diagnosis not present

## 2019-02-04 DIAGNOSIS — Z1231 Encounter for screening mammogram for malignant neoplasm of breast: Secondary | ICD-10-CM

## 2019-02-04 DIAGNOSIS — R809 Proteinuria, unspecified: Secondary | ICD-10-CM | POA: Diagnosis not present

## 2019-02-04 DIAGNOSIS — Z5181 Encounter for therapeutic drug level monitoring: Secondary | ICD-10-CM | POA: Diagnosis not present

## 2019-02-04 LAB — POCT GLUCOSE (DEVICE FOR HOME USE): GLUCOSE FASTING, POC: 112 mg/dL — AB (ref 70–99)

## 2019-02-04 MED ORDER — UMECLIDINIUM-VILANTEROL 62.5-25 MCG/INH IN AEPB: 1.0000 | INHALATION_SPRAY | Freq: Every day | RESPIRATORY_TRACT | 3 refills | Status: DC

## 2019-02-04 MED ORDER — LISINOPRIL 5 MG PO TABS: 2.5000 mg | ORAL_TABLET | Freq: Every day | ORAL | 2 refills | Status: DC

## 2019-02-04 MED ORDER — ALBUTEROL SULFATE (2.5 MG/3ML) 0.083% IN NEBU: 2.5000 mg | INHALATION_SOLUTION | Freq: Four times a day (QID) | RESPIRATORY_TRACT | 1 refills | Status: DC | PRN

## 2019-02-04 MED ORDER — BLOOD GLUCOSE METER KIT: PACK | 0 refills | Status: DC

## 2019-02-04 NOTE — Patient Instructions (Signed)
April Luna , Thank you for taking time to come for your Medicare Wellness Visit. I appreciate your ongoing commitment to your health goals. Please review the following plan we discussed and let me know if I can assist you in the future.   Screening recommendations/referrals: Mammogram: Ordered today. Please call [phone] to schedule your mammogram.  Recommended yearly ophthalmology/optometry visit for glaucoma screening and checkup Recommended yearly dental visit for hygiene and checkup   Vaccinations: Influenza vaccine: done 09/02/18 Pneumococcal vaccine: done 05/14/18 Tdap vaccine: done 06/30/15 Shingles vaccine: Shingrix discussed. Please contact your pharmacy for coverage information.     Advanced directives: Advance directive discussed with you today. I have provided a copy for you to complete at home and have notarized. Once this is complete please bring a copy in to our office so we can scan it into your chart.  Conditions/risks identified: If you wish to quit smoking, help is available. For free tobacco cessation program offerings call the [facility] at [phone] or Live Well Line at [phone]. You may also visit [email or web address] or email [email or web address] for more information on other programs.   Next appointment: Please follow up in one year for your Medicare Annual Wellness visit.    Preventive Care 40-64 Years, Female Preventive care refers to lifestyle choices and visits with your health care provider that can promote health and wellness. What does preventive care include?  A yearly physical exam. This is also called an annual well check.  Dental exams once or twice a year.  Routine eye exams. Ask your health care provider how often you should have your eyes checked.  Personal lifestyle choices, including:  Daily care of your teeth and gums.  Regular physical activity.  Eating a healthy diet.  Avoiding tobacco and drug  use.  Limiting alcohol use.  Practicing safe sex.  Taking low-dose aspirin daily starting at age 37.  Taking vitamin and mineral supplements as recommended by your health care provider. What happens during an annual well check? The services and screenings done by your health care provider during your annual well check will depend on your age, overall health, lifestyle risk factors, and family history of disease. Counseling  Your health care provider may ask you questions about your:  Alcohol use.  Tobacco use.  Drug use.  Emotional well-being.  Home and relationship well-being.  Sexual activity.  Eating habits.  Work and work Statistician.  Method of birth control.  Menstrual cycle.  Pregnancy history. Screening  You may have the following tests or measurements:  Height, weight, and BMI.  Blood pressure.  Lipid and cholesterol levels. These may be checked every 5 years, or more frequently if you are over 70 years old.  Skin check.  Lung cancer screening. You may have this screening every year starting at age 39 if you have a 30-pack-year history of smoking and currently smoke or have quit within the past 15 years.  Fecal occult blood test (FOBT) of the stool. You may have this test every year starting at age 49.  Flexible sigmoidoscopy or colonoscopy. You may have a sigmoidoscopy every 5 years or a colonoscopy every 10 years starting at age 41.  Hepatitis C blood test.  Hepatitis B blood test.  Sexually transmitted disease (STD) testing.  Diabetes screening. This is done by checking your blood sugar (glucose) after you have not eaten for a while (fasting). You may have this done every 1-3 years.  Mammogram. This may be done every  1-2 years. Talk to your health care provider about when you should start having regular mammograms. This may depend on whether you have a family history of breast cancer.  BRCA-related cancer screening. This may be done if you  have a family history of breast, ovarian, tubal, or peritoneal cancers.  Pelvic exam and Pap test. This may be done every 3 years starting at age 46. Starting at age 15, this may be done every 5 years if you have a Pap test in combination with an HPV test.  Bone density scan. This is done to screen for osteoporosis. You may have this scan if you are at high risk for osteoporosis. Discuss your test results, treatment options, and if necessary, the need for more tests with your health care provider. Vaccines  Your health care provider may recommend certain vaccines, such as:  Influenza vaccine. This is recommended every year.  Tetanus, diphtheria, and acellular pertussis (Tdap, Td) vaccine. You may need a Td booster every 10 years.  Zoster vaccine. You may need this after age 52.  Pneumococcal 13-valent conjugate (PCV13) vaccine. You may need this if you have certain conditions and were not previously vaccinated.  Pneumococcal polysaccharide (PPSV23) vaccine. You may need one or two doses if you smoke cigarettes or if you have certain conditions. Talk to your health care provider about which screenings and vaccines you need and how often you need them. This information is not intended to replace advice given to you by your health care provider. Make sure you discuss any questions you have with your health care provider. Document Released: 01/07/2016 Document Revised: 08/30/2016 Document Reviewed: 10/12/2015 Elsevier Interactive Patient Education  2017 [place] Prevention in the Home Falls can cause injuries. They can happen to people of all ages. There are many things you can do to make your home safe and to help prevent falls. What can I do on the outside of my home?  Regularly fix the edges of walkways and driveways and fix any cracks.  Remove anything that might make you trip as you walk through a door, such as a raised step or threshold.  Trim any bushes or trees on  the path to your home.  Use bright outdoor lighting.  Clear any walking paths of anything that might make someone trip, such as rocks or tools.  Regularly check to see if handrails are loose or broken. Make sure that both sides of any steps have handrails.  Any raised decks and porches should have guardrails on the edges.  Have any leaves, snow, or ice cleared regularly.  Use sand or salt on walking paths during winter.  Clean up any spills in your garage right away. This includes oil or grease spills. What can I do in the bathroom?  Use night lights.  Install grab bars by the toilet and in the tub and shower. Do not use towel bars as grab bars.  Use non-skid mats or decals in the tub or shower.  If you need to sit down in the shower, use a plastic, non-slip stool.  Keep the floor dry. Clean up any water that spills on the floor as soon as it happens.  Remove soap buildup in the tub or shower regularly.  Attach bath mats securely with double-sided non-slip rug tape.  Do not have throw rugs and other things on the floor that can make you trip. What can I do in the bedroom?  Use night lights.  Make  sure that you have a light by your bed that is easy to reach.  Do not use any sheets or blankets that are too big for your bed. They should not hang down onto the floor.  Have a firm chair that has side arms. You can use this for support while you get dressed.  Do not have throw rugs and other things on the floor that can make you trip. What can I do in the kitchen?  Clean up any spills right away.  Avoid walking on wet floors.  Keep items that you use a lot in easy-to-reach places.  If you need to reach something above you, use a strong step stool that has a grab bar.  Keep electrical cords out of the way.  Do not use floor polish or wax that makes floors slippery. If you must use wax, use non-skid floor wax.  Do not have throw rugs and other things on the floor that  can make you trip. What can I do with my stairs?  Do not leave any items on the stairs.  Make sure that there are handrails on both sides of the stairs and use them. Fix handrails that are broken or loose. Make sure that handrails are as long as the stairways.  Check any carpeting to make sure that it is firmly attached to the stairs. Fix any carpet that is loose or worn.  Avoid having throw rugs at the top or bottom of the stairs. If you do have throw rugs, attach them to the floor with carpet tape.  Make sure that you have a light switch at the top of the stairs and the bottom of the stairs. If you do not have them, ask someone to add them for you. What else can I do to help prevent falls?  Wear shoes that:  Do not have high heels.  Have rubber bottoms.  Are comfortable and fit you well.  Are closed at the toe. Do not wear sandals.  If you use a stepladder:  Make sure that it is fully opened. Do not climb a closed stepladder.  Make sure that both sides of the stepladder are locked into place.  Ask someone to hold it for you, if possible.  Clearly mark and make sure that you can see:  Any grab bars or handrails.  First and last steps.  Where the edge of each step is.  Use tools that help you move around (mobility aids) if they are needed. These include:  Canes.  Walkers.  Scooters.  Crutches.  Turn on the lights when you go into a dark area. Replace any light bulbs as soon as they burn out.  Set up your furniture so you have a clear path. Avoid moving your furniture around.  If any of your floors are uneven, fix them.  If there are any pets around you, be aware of where they are.  Review your medicines with your doctor. Some medicines can make you feel dizzy. This can increase your chance of falling. Ask your doctor what other things that you can do to help prevent falls. This information is not intended to replace advice given to you by your health care  provider. Make sure you discuss any questions you have with your health care provider. Document Released: 10/07/2009 Document Revised: 05/18/2016 Document Reviewed: 01/15/2015 Elsevier Interactive Patient Education  2017 [employer].

## 2019-02-04 NOTE — Patient Instructions (Addendum)
- Please take lisinopril 2.5mg  daily and come back in 1-2 weeks for lab recheck.  Diabetes Mellitus and Nutrition, Adult When you have diabetes (diabetes mellitus), it is very important to have healthy eating habits because your blood sugar (glucose) levels are greatly affected by what you eat and drink. Eating healthy foods in the appropriate amounts, at about the same times every day, can help you:  Control your blood glucose.  Lower your risk of heart disease.  Improve your blood pressure.  Reach or maintain a healthy weight. Every person with diabetes is different, and each person has different needs for a meal plan. Your health care provider may recommend that you work with a diet and nutrition specialist (dietitian) to make a meal plan that is best for you. Your meal plan may vary depending on factors such as:  The calories you need.  The medicines you take.  Your weight.  Your blood glucose, blood pressure, and cholesterol levels.  Your activity level.  Other health conditions you have, such as heart or kidney disease. How do carbohydrates affect me? Carbohydrates, also called carbs, affect your blood glucose level more than any other type of food. Eating carbs naturally raises the amount of glucose in your blood. Carb counting is a method for keeping track of how many carbs you eat. Counting carbs is important to keep your blood glucose at a healthy level, especially if you use insulin or take certain oral diabetes medicines. It is important to know how many carbs you can safely have in each meal. This is different for every person. Your dietitian can help you calculate how many carbs you should have at each meal and for each snack. Foods that contain carbs include:  Bread, cereal, rice, pasta, and crackers.  Potatoes and corn.  Peas, beans, and lentils.  Milk and yogurt.  Fruit and juice.  Desserts, such as cakes, cookies, ice cream, and candy. How does alcohol affect  me? Alcohol can cause a sudden decrease in blood glucose (hypoglycemia), especially if you use insulin or take certain oral diabetes medicines. Hypoglycemia can be a life-threatening condition. Symptoms of hypoglycemia (sleepiness, dizziness, and confusion) are similar to symptoms of having too much alcohol. If your health care provider says that alcohol is safe for you, follow these guidelines:  Limit alcohol intake to no more than 1 drink per day for nonpregnant women and 2 drinks per day for men. One drink equals 12 oz of beer, 5 oz of wine, or 1 oz of hard liquor.  Do not drink on an empty stomach.  Keep yourself hydrated with water, diet soda, or unsweetened iced tea.  Keep in mind that regular soda, juice, and other mixers may contain a lot of sugar and must be counted as carbs. What are tips for following this plan?  Reading food labels  Start by checking the serving size on the "Nutrition Facts" label of packaged foods and drinks. The amount of calories, carbs, fats, and other nutrients listed on the label is based on one serving of the item. Many items contain more than one serving per package.  Check the total grams (g) of carbs in one serving. You can calculate the number of servings of carbs in one serving by dividing the total carbs by 15. For example, if a food has 30 g of total carbs, it would be equal to 2 servings of carbs.  Check the number of grams (g) of saturated and trans fats in one serving.  Choose foods that have low or no amount of these fats.  Check the number of milligrams (mg) of salt (sodium) in one serving. Most people should limit total sodium intake to less than 2,300 mg per day.  Always check the nutrition information of foods labeled as "low-fat" or "nonfat". These foods may be higher in added sugar or refined carbs and should be avoided.  Talk to your dietitian to identify your daily goals for nutrients listed on the label. Shopping  Avoid buying  canned, premade, or processed foods. These foods tend to be high in fat, sodium, and added sugar.  Shop around the outside edge of the grocery store. This includes fresh fruits and vegetables, bulk grains, fresh meats, and fresh dairy. Cooking  Use low-heat cooking methods, such as baking, instead of high-heat cooking methods like deep frying.  Cook using healthy oils, such as olive, canola, or sunflower oil.  Avoid cooking with butter, cream, or high-fat meats. Meal planning  Eat meals and snacks regularly, preferably at the same times every day. Avoid going long periods of time without eating.  Eat foods high in fiber, such as fresh fruits, vegetables, beans, and whole grains. Talk to your dietitian about how many servings of carbs you can eat at each meal.  Eat 4-6 ounces (oz) of lean protein each day, such as lean meat, chicken, fish, eggs, or tofu. One oz of lean protein is equal to: ? 1 oz of meat, chicken, or fish. ? 1 egg. ?  cup of tofu.  Eat some foods each day that contain healthy fats, such as avocado, nuts, seeds, and fish. Lifestyle  Check your blood glucose regularly.  Exercise regularly as told by your health care provider. This may include: ? 150 minutes of moderate-intensity or vigorous-intensity exercise each week. This could be brisk walking, biking, or water aerobics. ? Stretching and doing strength exercises, such as yoga or weightlifting, at least 2 times a week.  Take medicines as told by your health care provider.  Do not use any products that contain nicotine or tobacco, such as cigarettes and e-cigarettes. If you need help quitting, ask your health care provider.  Work with a Social worker or diabetes educator to identify strategies to manage stress and any emotional and social challenges. Questions to ask a health care provider  Do I need to meet with a diabetes educator?  Do I need to meet with a dietitian?  What number can I call if I have  questions?  When are the best times to check my blood glucose? Where to find more information:  American Diabetes Association: diabetes.org  Academy of Nutrition and Dietetics: www.eatright.[employer] of Diabetes and Digestive and Kidney Diseases (NIH): [employer] Summary  A healthy meal plan will help you control your blood glucose and maintain a healthy lifestyle.  Working with a diet and nutrition specialist (dietitian) can help you make a meal plan that is best for you.  Keep in mind that carbohydrates (carbs) and alcohol have immediate effects on your blood glucose levels. It is important to count carbs and to use alcohol carefully. This information is not intended to replace advice given to you by your health care provider. Make sure you discuss any questions you have with your health care provider. Document Released: 09/07/2005 Document Revised: 07/11/2017 Document Reviewed: 01/15/2017 Elsevier Interactive Patient Education  2019 [employer].

## 2019-02-04 NOTE — Progress Notes (Signed)
Name: April Luna MRN: [id number] DOB: 27-Dec-1970 Date:02/04/2019 Progress Note Subjective Chief Complaint Chief Complaint  Patient presents with  . Follow-up    labs  . Jaw Pain    been have pain for a month   HPI  Patient presents to the clinic for follow-up of elevated A1C of 6.5, newly diagnosed with diabetes. Was seen 01/28/2019 and was positive for proteinuria and hematuria. Prescription sent for lisinopril daily but patient has not picked uo. Patient endorses blurred vision- chronic uses reading glasses but hasn't been to eye doctor in 2 years. Endorese fatigue, and polydipsia.Pt complaining of loss of appetite. Patient endorses eating larger dinner and small breakfast. Pt denies drink sodas or juices. Mostly drinks coffee with little sugar added. Patient has been trying to exercising more by walking and using weights occasionally. Pt current smoking and currently wants to quit. Smokes daily 1 ppd. States nicotine inhalers have helped with smoking cessation in the past. Endorses eating a lot of pasta and brown rice. However, she has tried to cut back on her carbohydrate intake. Was seen for oral condition though to be thrush and she stopped taking her trelegy inhaler daily and has been using albuterol PRN for the past few weeks. Denies chest pain or worsening SOB, polyphagia, nausea, vomiting.    Patient Active Problem List   Diagnosis Date Noted  . ASCUS of cervix with negative high risk HPV 09/05/2018  . Pre-diabetes 06/05/2018  . GERD without esophagitis 04/11/2018  . Hyperlipidemia 04/11/2018  . Hypertension 04/11/2018  . Hypothyroidism 04/26/2015  . Bipolar I disorder, most recent episode (or current) manic ([place]) 04/25/2015  . Delirium due to another medical condition 04/25/2015  . Cannabis abuse 04/25/2015   Past Medical History:  Diagnosis Date  . Allergy    pollen  . Anxiety   . Bipolar 1 disorder ([place])   . COPD (chronic obstructive pulmonary disease) ([place])   .  Depression   . Family history of adverse reaction to anesthesia    grand father had a stroke during anesthesia  . GERD (gastroesophageal reflux disease)   . History of kidney stones   . Hyperlipidemia   . Hypertension   . Hypothyroidism   . Pneumonia    Past Surgical History:  Procedure Laterality Date  . CESAREAN SECTION    . CYSTOSCOPY W/ RETROGRADES Left 11/08/2018   Procedure: CYSTOSCOPY WITH RETROGRADE PYELOGRAM;  Surgeon: Billey Co, MD;  Location: [facility] OR[facility];  Service: Urology;  Laterality: Left;  . CYSTOSCOPY/URETEROSCOPY/HOLMIUM LASER/STENT PLACEMENT Left 11/08/2018   Procedure: CYSTOSCOPY/URETEROSCOPY/HOLMIUM LASER/STENT PLACEMENT;  Surgeon: Billey Co, MD;  Location: [facility] OR[facility];  Service: Urology;  Laterality: Left;  . MOUTH SURGERY     wisdom teeth extraction   Social History   Tobacco Use  . Smoking status: Current Every Day Smoker    Packs/day: 1.00    Years: 34.00    Pack years: 34.00    Types: Cigarettes    Start date: 05/13/1985  . Smokeless tobacco: Never Used  Substance Use Topics  . Alcohol use: Not Currently    Alcohol/week: 0.0 standard drinks    Comment: rarely    Current Outpatient Medications:  .  amantadine (SYMMETREL) 100 MG capsule, Take 100 mg by mouth 2 (two) times daily. , Disp: , Rfl:  .  buPROPion (WELLBUTRIN XL) 150 MG 24 hr tablet, Take 150 mg by mouth daily. , Disp: , Rfl:  .  Cholecalciferol (VITAMIN D-3) 125 MCG (5000 UT) TABS, Take 5,000 Units  by mouth daily., Disp: , Rfl:  .  gabapentin (NEURONTIN) 300 MG capsule, Take 300 mg by mouth 2 (two) times daily., Disp: , Rfl:  .  hydrochlorothiazide (HYDRODIURIL) 12.5 MG tablet, Take 1 tablet (12.5 mg total) by mouth daily., Disp: 30 tablet, Rfl: 5 .  hydrOXYzine (VISTARIL) 50 MG capsule, Take 50 mg by mouth 2 (two) times daily as needed for anxiety., Disp: , Rfl:  .  levothyroxine (SYNTHROID, LEVOTHROID) 75 MCG tablet, Take 1 tablet (75 mcg total) by mouth daily at 6 (six) AM., Disp:  30 tablet, Rfl: 5 .  lubiprostone (AMITIZA) 24 MCG capsule, Take 1 capsule (24 mcg total) by mouth daily with breakfast., Disp: 30 capsule, Rfl: 5 .  nystatin (MYCOSTATIN) 100000 UNIT/ML suspension, Take 5 mLs (500,000 Units total) by mouth 4 (four) times daily., Disp: 280 mL, Rfl: 0 .  oxybutynin (DITROPAN-XL) 10 MG 24 hr tablet, Take 1 tablet (10 mg total) by mouth daily., Disp: 90 tablet, Rfl: 3 .  Paliperidone Palmitate ER (INVEGA TRINZA) 819 MG/2.625ML SUSY, Inject 819 mg into the muscle every 3 (three) months., Disp: , Rfl:  .  prazosin (MINIPRESS) 2 MG capsule, Take 2 mg by mouth at bedtime., Disp: , Rfl:  .  rosuvastatin (CRESTOR) 20 MG tablet, Take 1 tablet (20 mg total) by mouth daily. In place of pravastatin (Patient taking differently: Take 20 mg by mouth at bedtime. In place of pravastatin), Disp: 30 tablet, Rfl: 5 .  sertraline (ZOLOFT) 100 MG tablet, Take 200 mg by mouth daily. , Disp: , Rfl:  .  albuterol (PROVENTIL) (2.5 MG/3ML) 0.083% nebulizer solution, Take 3 mLs (2.5 mg total) by nebulization every 6 (six) hours as needed for wheezing or shortness of breath., Disp: 150 mL, Rfl: 1 .  blood glucose meter kit and supplies, Dispense based on patient and insurance preference. Use up to four times daily as directed. (FOR ICD-10 E10.9, E11.9)., Disp: 1 each, Rfl: 0 .  lisinopril (PRINIVIL,ZESTRIL) 5 MG tablet, Take 0.5 tablets (2.5 mg total) by mouth daily., Disp: 15 tablet, Rfl: 2 .  pantoprazole (PROTONIX) 40 MG tablet, Take 1 tablet (40 mg total) by mouth daily at 6 (six) AM., Disp: 30 tablet, Rfl: 5 .  umeclidinium-vilanterol (ANORO ELLIPTA) 62.5-25 MCG/INH AEPB, Inhale 1 puff into the lungs daily., Disp: 1 each, Rfl: 3 Allergies  Allergen Reactions  . Penicillins Other (See Comments)    GI distress Has patient had a PCN reaction causing immediate rash, facial/tongue/throat swelling, SOB or lightheadedness with hypotension: No Has patient had a PCN reaction causing severe rash  involving mucus membranes or skin necrosis: No Has patient had a PCN reaction that required hospitalization: No Has patient had a PCN reaction occurring within the last 10 years: No If all of the above answers are "NO", then may proceed with Cephalosporin use.   Marland Kitchen Perphenazine Other (See Comments)    Tremors, muscle weakness, tongue swelling  . Sulfa Antibiotics Other (See Comments)    GI distress   . Abilify [Aripiprazole] Rash   Review of Systems  Constitutional: Positive for malaise/fatigue.  Eyes: Positive for blurred vision.  Respiratory: Positive for sputum production and wheezing. Negative for hemoptysis and shortness of breath (at baseline).   Cardiovascular: Negative for chest pain and leg swelling.  Gastrointestinal: Negative for abdominal pain, nausea and vomiting.  Musculoskeletal: Positive for back pain (chronic lower back pain). Negative for myalgias.  Skin: Negative for rash.  Neurological: Negative for dizziness, sensory change and headaches.  Psychiatric/Behavioral:  Negative for depression. The patient is not nervous/anxious.     No other specific complaints in a complete review of systems (except as listed in HPI above). Objective Vitals:   02/04/19 1101  BP: 110/80  Pulse: 98  Resp: 18  Temp: 98.3 F (36.8 C)  TempSrc: Oral  SpO2: 94%  Weight: 157 lb 3.2 oz (71.3 kg)  Height: 4' 11" (1.499 m)    Body mass index is 31.75 kg/m. Nursing Note and Vital Signs reviewed. Physical Exam Constitutional:      Appearance: Normal appearance.  HENT:     Head: Normocephalic.  Cardiovascular:     Rate and Rhythm: Normal rate and regular rhythm.     Pulses: Normal pulses.     Heart sounds: Normal heart sounds.  Pulmonary:     Effort: Pulmonary effort is normal.     Breath sounds: Wheezing present.  Feet:     Right foot:     Skin integrity: Callus present.     Toenail Condition: Right toenails are normal.     Left foot:     Skin integrity: Callus present.      Toenail Condition: Left toenails are normal.  Skin:    General: Skin is warm and dry.  Neurological:     General: No focal deficit present.     Mental Status: She is alert and oriented to person, place, and time.  Psychiatric:        Mood and Affect: Mood normal.    Diabetic Foot Exam - Simple   Simple Foot Form Diabetic Foot exam was performed with the following findings:  Yes 02/04/2019 11:58 AM  Visual Inspection No deformities, no ulcerations, no other skin breakdown bilaterally:  Yes Sensation Testing Intact to touch and monofilament testing bilaterally:  Yes Pulse Check Posterior Tibialis and Dorsalis pulse intact bilaterally:  Yes Comments      ? Results for orders placed or performed in visit on 02/04/19 (from the past 48 hour(s))  POCT Glucose (Device for Home Use)     Status: Abnormal   Collection Time: 02/04/19 11:44 AM  Result Value Ref Range   Glucose Fasting, POC 112 (A) 70 - 99 mg/dL    Comment: She had a cup of coffee at 9 a.m. nothing to eat today   POC Glucose     Assessment & Plan 1. Type 2 diabetes mellitus with microalbuminuria, without long-term current use of insulin ([place]) Extensive education about diabetes management. Encouraged her to engage in at least 30 minutes of aerobic exercise at least 3 times a week. Educated about eating smaller dinner and bigger breakfast as this will help with weight loss and feeling of fatigue throughout the day, smaller more frequent meals. Educated pt about doing daily feet examination, as diabetes increases risk of foot ulcers. Fasting blood glucose, 112 today. Provided education about treatment of diabetes, including medication options in the future will first trial lifestyle modification, such as healthy diet choices, weight loss, and daily exercise to help at this point.  Recheck A1c in 2 months for reevaluation. - POCT Glucose (Device for Home Use) - Ambulatory referral to Ophthalmology - lisinopril  (PRINIVIL,ZESTRIL) 5 MG tablet; Take 0.5 tablets (2.5 mg total) by mouth daily.  Dispense: 15 tablet; Refill: 2 - blood glucose meter kit and supplies; Dispense based on patient and insurance preference. Use up to four times daily as directed. (FOR ICD-10 E10.9, E11.9).  Dispense: 1 each; Refill: 0  2. Mucopurulent chronic bronchitis ([facility]) Discussed the  importance of daily maintenance medications to prevent exacerbation. Discussed smoking cessation.  - albuterol (PROVENTIL) (2.5 MG/3ML) 0.083% nebulizer solution; Take 3 mLs (2.5 mg total) by nebulization every 6 (six) hours as needed for wheezing or shortness of breath.  Dispense: 150 mL; Refill: 1 - umeclidinium-vilanterol (ANORO ELLIPTA) 62.5-25 MCG/INH AEPB; Inhale 1 puff into the lungs daily.  Dispense: 1 each; Refill: 3  3. Medication monitoring encounter Return in 7-14 days for repeat BMP - BASIC METABOLIC PANEL WITH GFR; Future    ?

## 2019-02-04 NOTE — Progress Notes (Signed)
Subjective:   April Luna is a 48 y.o. female who presents for an Initial Medicare Annual Wellness Visit.  Review of Systems      Cardiac Risk Factors include: advanced age (>60men, >49 women);obesity (BMI >30kg/m2);diabetes mellitus;dyslipidemia;hypertension;smoking/ tobacco exposure     Objective:    Today's Vitals   02/04/19 1128 02/04/19 1238  BP: 110/80   Pulse: 98   Resp: 18   Temp: 98.3 F (36.8 C)   TempSrc: Oral   SpO2: 94%   Weight: 157 lb 3.2 oz (71.3 kg)   Height: 4\' 11"  (1.499 m)   PainSc:  2    Body mass index is 31.75 kg/m.  Advanced Directives 02/04/2019 11/08/2018 11/06/2018 01/24/2018 02/02/2017 06/30/2015  Does Patient Have a Medical Advance Directive? No No No No No No  Would patient like information on creating a medical advance directive? Yes (MAU/Ambulatory/Procedural Areas - Information given) - Yes (MAU/Ambulatory/Procedural Areas - Information given) No - Patient declined Yes (ED - Information included in AVS) Yes - Educational materials given  Some encounter information is confidential and restricted. Go to Review Flowsheets activity to see all data.    Current Medications (verified) Outpatient Encounter Medications as of 02/04/2019  Medication Sig  . albuterol (PROAIR HFA) 108 (90 Base) MCG/ACT inhaler TAKE 2 PUFFS BY MOUTH EVERY 6 HOURS AS NEEDED FOR WHEEZE OR SHORTNESS OF BREATH (Patient not taking: Reported on 02/04/2019)  . amantadine (SYMMETREL) 100 MG capsule Take 100 mg by mouth 2 (two) times daily.   Marland Kitchen buPROPion (WELLBUTRIN XL) 150 MG 24 hr tablet Take 150 mg by mouth daily.   . Cholecalciferol (VITAMIN D-3) 125 MCG (5000 UT) TABS Take 5,000 Units by mouth daily.  . Fluticasone-Umeclidin-Vilant (TRELEGY ELLIPTA) 100-62.5-25 MCG/INH AEPB Inhale 1 puff into the lungs daily.  Marland Kitchen gabapentin (NEURONTIN) 300 MG capsule Take 300 mg by mouth 2 (two) times daily.  . hydrochlorothiazide (HYDRODIURIL) 12.5 MG tablet Take 1 tablet (12.5 mg total)  by mouth daily.  . hydrOXYzine (VISTARIL) 50 MG capsule Take 50 mg by mouth 2 (two) times daily as needed for anxiety.  Marland Kitchen levothyroxine (SYNTHROID, LEVOTHROID) 75 MCG tablet Take 1 tablet (75 mcg total) by mouth daily at 6 (six) AM.  . lubiprostone (AMITIZA) 24 MCG capsule Take 1 capsule (24 mcg total) by mouth daily with breakfast.  . nystatin (MYCOSTATIN) 100000 UNIT/ML suspension Take 5 mLs (500,000 Units total) by mouth 4 (four) times daily.  Marland Kitchen oxybutynin (DITROPAN-XL) 10 MG 24 hr tablet Take 1 tablet (10 mg total) by mouth daily.  . Paliperidone Palmitate ER (INVEGA TRINZA) 819 MG/2.625ML SUSY Inject 819 mg into the muscle every 3 (three) months.  . pantoprazole (PROTONIX) 40 MG tablet Take 1 tablet (40 mg total) by mouth daily at 6 (six) AM.  . prazosin (MINIPRESS) 2 MG capsule Take 2 mg by mouth at bedtime.  . rosuvastatin (CRESTOR) 20 MG tablet Take 1 tablet (20 mg total) by mouth daily. In place of pravastatin (Patient taking differently: Take 20 mg by mouth at bedtime. In place of pravastatin)  . sertraline (ZOLOFT) 100 MG tablet Take 200 mg by mouth daily.   . [DISCONTINUED] lisinopril (PRINIVIL,ZESTRIL) 5 MG tablet Take 1 tablet (5 mg total) by mouth daily. (Patient not taking: Reported on 02/04/2019)   No facility-administered encounter medications on file as of 02/04/2019.     Allergies (verified) Penicillins; Perphenazine; Sulfa antibiotics; and Abilify [aripiprazole]   History: Past Medical History:  Diagnosis Date  . Allergy  pollen  . Anxiety   . Bipolar 1 disorder ([place])   . COPD (chronic obstructive pulmonary disease) ([place])   . Depression   . Family history of adverse reaction to anesthesia    grand father had a stroke during anesthesia  . GERD (gastroesophageal reflux disease)   . History of kidney stones   . Hyperlipidemia   . Hypertension   . Hypothyroidism   . Pneumonia    Past Surgical History:  Procedure Laterality Date  . CESAREAN SECTION    .  CYSTOSCOPY W/ RETROGRADES Left 11/08/2018   Procedure: CYSTOSCOPY WITH RETROGRADE PYELOGRAM;  Surgeon: Billey Co, MD;  Location: [facility] ORS;  Service: Urology;  Laterality: Left;  . CYSTOSCOPY/URETEROSCOPY/HOLMIUM LASER/STENT PLACEMENT Left 11/08/2018   Procedure: CYSTOSCOPY/URETEROSCOPY/HOLMIUM LASER/STENT PLACEMENT;  Surgeon: Billey Co, MD;  Location: [facility] ORS;  Service: Urology;  Laterality: Left;  . MOUTH SURGERY     wisdom teeth extraction   Family History  Problem Relation Age of Onset  . Depression Mother   . Anxiety disorder Mother   . Diabetes Mother   . Hypertension Mother   . Hyperlipidemia Mother   . Cancer Mother   . Cancer Father        colon cancer  . Depression Brother   . Anxiety disorder Brother   . Diabetes Mellitus II Maternal Grandmother   . Hypercholesterolemia Maternal Grandmother   . Cancer Paternal Grandmother   . Diabetes Paternal Grandmother    Social History   Socioeconomic History  . Marital status: Married    Spouse name: Montine Circle  . Number of children: 1  . Years of education: 4  . Highest education level: High school graduate  Occupational History  . Occupation: unemployed    Comment: disabled  Social Needs  . Financial resource strain: Somewhat hard  . Food insecurity:    Worry: Sometimes true    Inability: Never true  . Transportation needs:    Medical: Yes    Non-medical: Yes  Tobacco Use  . Smoking status: Current Every Day Smoker    Packs/day: 1.00    Years: 34.00    Pack years: 34.00    Types: Cigarettes    Start date: 05/13/1985  . Smokeless tobacco: Never Used  Substance and Sexual Activity  . Alcohol use: Not Currently    Alcohol/week: 0.0 standard drinks    Comment: rarely  . Drug use: Not Currently    Types: Marijuana    Comment: none since August 2019  . Sexual activity: Not Currently    Birth control/protection: Post-menopausal  Lifestyle  . Physical activity:    Days per week: 0 days     Minutes per session: 0 min  . Stress: To some extent  Relationships  . Social connections:    Talks on phone: Three times a week    Gets together: Once a week    Attends religious service: Never    Active member of club or organization: No    Attends meetings of clubs or organizations: Never    Relationship status: Married  Other Topics Concern  . Not on file  Social History Narrative  . Not on file    Tobacco Counseling Ready to quit: Not Answered Counseling given: Not Answered   Clinical Intake:  Pre-visit preparation completed: Yes  Pain : 0-10 Pain Score: 2  Pain Type: Acute pain Pain Location: Jaw Pain Orientation: Left, Upper, Lower Pain Descriptors / Indicators: Aching Pain Onset: More than a month  ago Pain Frequency: Constant     BMI - recorded: 31.75 Nutritional Status: BMI > 30  Obese Nutritional Risks: None Diabetes: No  How often do you need to have someone help you when you read instructions, pamphlets, or other written materials from your doctor or pharmacy?: 1 - Never What is the last grade level you completed in school?: 12th grade  Interpreter Needed?: No  Information entered by :: Clemetine Marker LPN   Activities of Daily Living In your present state of health, do you have any difficulty performing the following activities: 02/04/2019 02/04/2019  Hearing? Y Y  Comment - -  Vision? Y Y  Difficulty concentrating or making decisions? Y Y  Comment brain fog when her blood sugar is low -  Walking or climbing stairs? Y Y  Comment - -  Dressing or bathing? N N  Doing errands, shopping? N [place] and eating ? N -  Using the Toilet? N -  In the past six months, have you accidently leaked urine? Y -  Comment wears pads for protections -  Do you have problems with loss of bowel control? N -  Managing your Medications? N -  Managing your Finances? N -  Housekeeping or managing your Housekeeping? N -  Some recent data might be  hidden     Immunizations and Health Maintenance Immunization History  Administered Date(s) Administered  . Influenza,inj,Quad PF,6+ Mos 09/02/2018  . PPD Test 05/10/2010  . Pneumococcal Polysaccharide-23 05/14/2018  . Tdap 06/30/2015   There are no preventive care reminders to display for this patient.  Patient Care Team: Steele Sizer, MD as PCP - General (Family Medicine) Sharmaine Base, MD as Referring Physician (Psychiatry) Bjorn Loser, MD as Consulting Physician (Urology)  Indicate any recent Medical Services you may have received from other than [facility] providers in the past year (date may be approximate).     Assessment:   This is a routine wellness examination for [place].  Hearing/Vision screen Hearing Screening Comments: Pt c/o muffled hearing on left side, PCP referring to ENT Vision Screening Comments: Pt being referred to [facility]  Dietary issues and exercise activities discussed: Current Exercise Habits: The patient does not participate in regular exercise at present  Goals    . Increase physical activity     Pt would like to increase physical activity by starting to walk at least 3 days per week       Depression Screen PHQ 2/9 Scores 02/04/2019 01/28/2019 01/10/2019 01/02/2019 09/02/2018 05/14/2018 04/11/2018  PHQ - 2 Score 2 4 4 2 2 1 1   PHQ- 9 Score 13 11 12 12 16 12  -    Fall Risk Fall Risk  02/04/2019 01/28/2019 01/02/2019 09/02/2018 05/14/2018  Falls in the past year? - 0 0 No No  Number falls in past yr: 0 0 0 - -  Injury with Fall? 0 0 0 - -  Follow up - Falls evaluation completed - - -   FALL RISK PREVENTION PERTAINING TO THE HOME:  Any stairs in or around the home? Yes  If so, are they are without handrails? Yes   Home free of loose throw rugs in walkways, pet beds, electrical cords, etc? Yes  Adequate lighting in your home to reduce risk of falls? Yes   ASSISTIVE DEVICES UTILIZED TO PREVENT FALLS:  Life alert? No  Use of a cane,  walker or w/c? No  Grab bars in the bathroom? No  Shower chair or bench in shower? No  Elevated toilet seat or a handicapped toilet? No   DME ORDERS:  DME order needed?  No   TIMED UP AND GO:  Was the test performed? Yes .  Length of time to ambulate 10 feet: 6 sec.   GAIT:  Appearance of gait: Gait stead-fast and without the use of an assistive device.    Education: Fall risk prevention has been discussed.  Intervention(s) required? No   Cognitive Function:     6CIT Screen 02/04/2019  What Year? 0 points  What month? 0 points  What time? 0 points  Count back from 20 0 points  Months in reverse 0 points  Repeat phrase 0 points  Total Score 0    Screening Tests Health Maintenance  Topic Date Due  . PAP SMEAR-Modifier  09/03/2019  . TETANUS/TDAP  06/29/2025  . INFLUENZA VACCINE  Completed  . HIV Screening  Completed    Qualifies for Shingles Vaccine? Yes . Due for Shingrix. Education has been provided regarding the importance of this vaccine. Pt has been advised to call insurance company to determine out of pocket expense. Advised may also receive vaccine at local pharmacy or Health Dept. Verbalized acceptance and understanding.  Tdap: Up to date  Flu Vaccine: Up to date  Pneumococcal Vaccine: PPSV23 done 05/14/18.   Cancer Screenings:  Colorectal Screening: due at age 48.  Mammogram:  Ordered today. Pt provided with contact information and advised to call to schedule appt.   Lung Cancer Screening: (Low Dose CT Chest recommended if Age 18-80 years, 30 pack-year currently smoking OR have quit w/in 15years.) does qualify.   Lung Cancer Screening Referral: An Epic message has been sent to Burgess Estelle, RN (Oncology Nurse Navigator) regarding the possible need for this exam. April Luna will review the patient's chart to determine if the patient truly qualifies for the exam. If the patient qualifies, April Luna will order the Low Dose CT of the chest to facilitate the  scheduling of this exam.  Additional Screening:  Hepatitis C Screening: does not qualify  Vision Screening: Recommended annual ophthalmology exams for early detection of glaucoma and other disorders of the eye. Is the patient up to date with their annual eye exam?  No  Who is the provider or what is the name of the office in which the pt attends annual eye exams? [place] Screening: Recommended annual dental exams for proper oral hygiene  Community Resource Referral:  CRR required this visit?  No      Plan:    I have personally reviewed and addressed the Medicare Annual Wellness questionnaire and have noted the following in the patient's chart:  A. Medical and social history B. Use of alcohol, tobacco or illicit drugs  C. Current medications and supplements D. Functional ability and status E.  Nutritional status F.  Physical activity G. Advance directives H. List of other physicians I.  Hospitalizations, surgeries, and ER visits in previous 12 months J.  [place] such as hearing and vision if needed, cognitive and depression L. Referrals and appointments   In addition, I have reviewed and discussed with patient certain preventive protocols, quality metrics, and best practice recommendations. A written personalized care plan for preventive services as well as general preventive health recommendations were provided to patient.   Signed,  Clemetine Marker, LPN Nurse Health Advisor   Nurse Notes: pt seen by Suezanne Cheshire NP today prior to my visit. Pt states  she is doing well but needs dental work done to help alleviate pain in her left jaw. She has an ACT team for mental health including a therapist who visits her at home once a week. She agreed to mammogram and lung cancer screening but c/o transportation issues and working on medical transportation.

## 2019-02-24 DIAGNOSIS — B370 Candidal stomatitis: Secondary | ICD-10-CM | POA: Diagnosis not present

## 2019-02-24 DIAGNOSIS — H9212 Otorrhea, left ear: Secondary | ICD-10-CM | POA: Diagnosis not present

## 2019-03-03 ENCOUNTER — Other Ambulatory Visit: Payer: Self-pay

## 2019-03-03 MED ORDER — ROSUVASTATIN CALCIUM 20 MG PO TABS: 20.0000 mg | ORAL_TABLET | Freq: Every day | ORAL | 0 refills | Status: DC

## 2019-03-03 NOTE — Telephone Encounter (Signed)
Refill Request for Cholesterol medication. Crestor 20 mg  Last physical: 02/04/2019  Lab Results  Component Value Date   CHOL 152 09/02/2018   HDL 40 (L) 09/02/2018   LDLCALC 88 09/02/2018   TRIG 142 09/02/2018   CHOLHDL 3.8 09/02/2018    Follow up: 04/16/2019

## 2019-03-11 ENCOUNTER — Other Ambulatory Visit: Payer: Self-pay

## 2019-03-11 DIAGNOSIS — E1129 Type 2 diabetes mellitus with other diabetic kidney complication: Secondary | ICD-10-CM

## 2019-03-11 DIAGNOSIS — R809 Proteinuria, unspecified: Principal | ICD-10-CM

## 2019-03-11 MED ORDER — LISINOPRIL 5 MG PO TABS: 5.0000 mg | ORAL_TABLET | Freq: Every day | ORAL | 0 refills | Status: DC

## 2019-03-11 NOTE — Telephone Encounter (Signed)
Hypertension medication request: Lisinopril   Last office visit pertaining to hypertension: 02/04/2019   BP Readings from Last 3 Encounters:  02/04/19 110/80  02/04/19 110/80  01/28/19 110/68    Lab Results  Component Value Date   CREATININE 1.13 (H) 09/02/2018   BUN 14 09/02/2018   NA 137 09/02/2018   K 3.8 09/02/2018   CL 100 09/02/2018   CO2 26 09/02/2018     Follow up on 04/16/2019

## 2019-04-03 ENCOUNTER — Other Ambulatory Visit: Payer: Self-pay

## 2019-04-03 NOTE — Telephone Encounter (Signed)
Refill Request for Cholesterol medication. Rosuvastatin to [place] visit:  02/04/2019   Lab Results  Component Value Date   CHOL 152 09/02/2018   HDL 40 (L) 09/02/2018   LDLCALC 88 09/02/2018   TRIG 142 09/02/2018   CHOLHDL 3.8 09/02/2018    Follow up on 04/16/2019

## 2019-04-04 MED ORDER — ROSUVASTATIN CALCIUM 20 MG PO TABS: 20.0000 mg | ORAL_TABLET | Freq: Every day | ORAL | 0 refills | Status: DC

## 2019-04-16 ENCOUNTER — Other Ambulatory Visit: Payer: Self-pay

## 2019-04-16 ENCOUNTER — Ambulatory Visit: Payer: Medicare Other | Admitting: Family Medicine

## 2019-05-27 ENCOUNTER — Other Ambulatory Visit: Payer: Self-pay

## 2019-05-27 DIAGNOSIS — R809 Proteinuria, unspecified: Secondary | ICD-10-CM

## 2019-05-27 DIAGNOSIS — E1129 Type 2 diabetes mellitus with other diabetic kidney complication: Secondary | ICD-10-CM

## 2019-05-27 MED ORDER — LISINOPRIL 5 MG PO TABS: 5.0000 mg | ORAL_TABLET | Freq: Every day | ORAL | 0 refills | Status: DC

## 2019-05-27 MED ORDER — ROSUVASTATIN CALCIUM 20 MG PO TABS: 20.0000 mg | ORAL_TABLET | Freq: Every day | ORAL | 0 refills | Status: DC

## 2019-06-07 ENCOUNTER — Other Ambulatory Visit: Payer: Self-pay | Admitting: Family Medicine

## 2019-06-07 DIAGNOSIS — J411 Mucopurulent chronic bronchitis: Secondary | ICD-10-CM

## 2019-06-24 ENCOUNTER — Ambulatory Visit (INDEPENDENT_AMBULATORY_CARE_PROVIDER_SITE_OTHER): Payer: Medicare Other | Admitting: Family Medicine

## 2019-06-24 ENCOUNTER — Other Ambulatory Visit: Payer: Self-pay

## 2019-06-24 ENCOUNTER — Encounter: Payer: Self-pay | Admitting: Family Medicine

## 2019-06-24 VITALS — BP 130/78 | HR 100 | Temp 97.1°F | Resp 16 | Ht 59.0 in | Wt 150.4 lb

## 2019-06-24 DIAGNOSIS — G8929 Other chronic pain: Secondary | ICD-10-CM

## 2019-06-24 DIAGNOSIS — I10 Essential (primary) hypertension: Secondary | ICD-10-CM

## 2019-06-24 DIAGNOSIS — K5909 Other constipation: Secondary | ICD-10-CM | POA: Insufficient documentation

## 2019-06-24 DIAGNOSIS — J441 Chronic obstructive pulmonary disease with (acute) exacerbation: Secondary | ICD-10-CM | POA: Insufficient documentation

## 2019-06-24 DIAGNOSIS — M5441 Lumbago with sciatica, right side: Secondary | ICD-10-CM

## 2019-06-24 DIAGNOSIS — E1165 Type 2 diabetes mellitus with hyperglycemia: Secondary | ICD-10-CM | POA: Insufficient documentation

## 2019-06-24 DIAGNOSIS — N390 Urinary tract infection, site not specified: Secondary | ICD-10-CM | POA: Diagnosis not present

## 2019-06-24 DIAGNOSIS — J449 Chronic obstructive pulmonary disease, unspecified: Secondary | ICD-10-CM | POA: Diagnosis not present

## 2019-06-24 DIAGNOSIS — E1129 Type 2 diabetes mellitus with other diabetic kidney complication: Secondary | ICD-10-CM | POA: Diagnosis not present

## 2019-06-24 DIAGNOSIS — Z716 Tobacco abuse counseling: Secondary | ICD-10-CM

## 2019-06-24 DIAGNOSIS — E032 Hypothyroidism due to medicaments and other exogenous substances: Secondary | ICD-10-CM | POA: Diagnosis not present

## 2019-06-24 DIAGNOSIS — E785 Hyperlipidemia, unspecified: Secondary | ICD-10-CM

## 2019-06-24 DIAGNOSIS — R809 Proteinuria, unspecified: Secondary | ICD-10-CM | POA: Insufficient documentation

## 2019-06-24 DIAGNOSIS — F3110 Bipolar disorder, current episode manic without psychotic features, unspecified: Secondary | ICD-10-CM

## 2019-06-24 DIAGNOSIS — E1169 Type 2 diabetes mellitus with other specified complication: Secondary | ICD-10-CM | POA: Insufficient documentation

## 2019-06-24 HISTORY — DX: Type 2 diabetes mellitus with other diabetic kidney complication: R80.9

## 2019-06-24 HISTORY — DX: Type 2 diabetes mellitus with other diabetic kidney complication: E11.29

## 2019-06-24 MED ORDER — HYDROCHLOROTHIAZIDE 12.5 MG PO TABS: 12.5000 mg | ORAL_TABLET | Freq: Every day | ORAL | 5 refills | Status: DC

## 2019-06-24 MED ORDER — ANORO ELLIPTA 62.5-25 MCG/INH IN AEPB: 1.0000 | INHALATION_SPRAY | Freq: Every day | RESPIRATORY_TRACT | 3 refills | Status: DC

## 2019-06-24 MED ORDER — LUBIPROSTONE 24 MCG PO CAPS: 24.0000 ug | ORAL_CAPSULE | Freq: Every day | ORAL | 5 refills | Status: DC

## 2019-06-24 MED ORDER — BLOOD GLUCOSE METER KIT: PACK | 0 refills | Status: DC

## 2019-06-24 MED ORDER — BUDESONIDE-FORMOTEROL FUMARATE 160-4.5 MCG/ACT IN AERO: 2.0000 | INHALATION_SPRAY | Freq: Two times a day (BID) | RESPIRATORY_TRACT | 3 refills | Status: DC

## 2019-06-24 NOTE — Addendum Note (Signed)
Addended by: Hubbard Hartshorn on: 06/24/2019 09:32 AM   Modules accepted: Orders

## 2019-06-24 NOTE — Patient Instructions (Signed)
Low Back Sprain or Strain Rehab Ask your health care provider which exercises are safe for you. Do exercises exactly as told by your health care provider and adjust them as directed. It is normal to feel mild stretching, pulling, tightness, or discomfort as you do these exercises. Stop right away if you feel sudden pain or your pain gets worse. Do not begin these exercises until told by your health care provider. Stretching and range-of-motion exercises These exercises warm up your muscles and joints and improve the movement and flexibility of your back. These exercises also help to relieve pain, numbness, and tingling. Lumbar rotation  1. Lie on your back on a firm surface and bend your knees. 2. Straighten your arms out to your sides so each arm forms a 90-degree angle (right angle) with a side of your body. 3. Slowly move (rotate) both of your knees to one side of your body until you feel a stretch in your lower back (lumbar). Try not to let your shoulders lift off the floor. 4. Hold this position for __________ seconds. 5. Tense your abdominal muscles and slowly move your knees back to the starting position. 6. Repeat this exercise on the other side of your body. Repeat __________ times. Complete this exercise __________ times a day. Single knee to chest  1. Lie on your back on a firm surface with both legs straight. 2. Bend one of your knees. Use your hands to move your knee up toward your chest until you feel a gentle stretch in your lower back and buttock. ? Hold your leg in this position by holding on to the front of your knee. ? Keep your other leg as straight as possible. 3. Hold this position for __________ seconds. 4. Slowly return to the starting position. 5. Repeat with your other leg. Repeat __________ times. Complete this exercise __________ times a day. Prone extension on elbows  1. Lie on your abdomen on a firm surface (prone position). 2. Prop yourself up on your elbows.  3. Use your arms to help lift your chest up until you feel a gentle stretch in your abdomen and your lower back. ? This will place some of your body weight on your elbows. If this is uncomfortable, try stacking pillows under your chest. ? Your hips should stay down, against the surface that you are lying on. Keep your hip and back muscles relaxed. 4. Hold this position for __________ seconds. 5. Slowly relax your upper body and return to the starting position. Repeat __________ times. Complete this exercise __________ times a day. Strengthening exercises These exercises build strength and endurance in your back. Endurance is the ability to use your muscles for a long time, even after they get tired. Pelvic tilt This exercise strengthens the muscles that lie deep in the abdomen. 1. Lie on your back on a firm surface. Bend your knees and keep your feet flat on the floor. 2. Tense your abdominal muscles. Tip your pelvis up toward the ceiling and flatten your lower back into the floor. ? To help with this exercise, you may place a small towel under your lower back and try to push your back into the towel. 3. Hold this position for __________ seconds. 4. Let your muscles relax completely before you repeat this exercise. Repeat __________ times. Complete this exercise __________ times a day. Alternating arm and leg raises  1. Get on your hands and knees on a firm surface. If you are on a hard floor, you  may want to use padding, such as an exercise mat, to cushion your knees. 2. Line up your arms and legs. Your hands should be directly below your shoulders, and your knees should be directly below your hips. 3. Lift your left leg behind you. At the same time, raise your right arm and straighten it in front of you. ? Do not lift your leg higher than your hip. ? Do not lift your arm higher than your shoulder. ? Keep your abdominal and back muscles tight. ? Keep your hips facing the ground. ? Do not  arch your back. ? Keep your balance carefully, and do not hold your breath. 4. Hold this position for __________ seconds. 5. Slowly return to the starting position. 6. Repeat with your right leg and your left arm. Repeat __________ times. Complete this exercise __________ times a day. Abdominal set with straight leg raise  1. Lie on your back on a firm surface. 2. Bend one of your knees and keep your other leg straight. 3. Tense your abdominal muscles and lift your straight leg up, 4-6 inches (10-15 cm) off the ground. 4. Keep your abdominal muscles tight and hold this position for __________ seconds. ? Do not hold your breath. ? Do not arch your back. Keep it flat against the ground. 5. Keep your abdominal muscles tense as you slowly lower your leg back to the starting position. 6. Repeat with your other leg. Repeat __________ times. Complete this exercise __________ times a day. Single leg lower with bent knees 1. Lie on your back on a firm surface. 2. Tense your abdominal muscles and lift your feet off the floor, one foot at a time, so your knees and hips are bent in 90-degree angles (right angles). ? Your knees should be over your hips and your lower legs should be parallel to the floor. 3. Keeping your abdominal muscles tense and your knee bent, slowly lower one of your legs so your toe touches the ground. 4. Lift your leg back up to return to the starting position. ? Do not hold your breath. ? Do not let your back arch. Keep your back flat against the ground. 5. Repeat with your other leg. Repeat __________ times. Complete this exercise __________ times a day. Posture and body mechanics Good posture and healthy body mechanics can help to relieve stress in your body's tissues and joints. Body mechanics refers to the movements and positions of your body while you do your daily activities. Posture is part of body mechanics. Good posture means:  Your spine is in its natural S-curve  position (neutral).  Your shoulders are pulled back slightly.  Your head is not tipped forward. Follow these guidelines to improve your posture and body mechanics in your everyday activities. Standing   When standing, keep your spine neutral and your feet about hip width apart. Keep a slight bend in your knees. Your ears, shoulders, and hips should line up.  When you do a task in which you stand in one place for a long time, place one foot up on a stable object that is 2-4 inches (5-10 cm) high, such as a footstool. This helps keep your spine neutral. Sitting   When sitting, keep your spine neutral and keep your feet flat on the floor. Use a footrest, if necessary, and keep your thighs parallel to the floor. Avoid rounding your shoulders, and avoid tilting your head forward.  When working at a desk or a Teaching laboratory technician, keep your desk  at a height where your hands are slightly lower than your elbows. Slide your chair under your desk so you are close enough to maintain good posture.  When working at a computer, place your monitor at a height where you are looking straight ahead and you do not have to tilt your head forward or downward to look at the screen. Resting  When lying down and resting, avoid positions that are most painful for you.  If you have pain with activities such as sitting, bending, stooping, or squatting, lie in a position in which your body does not bend very much. For example, avoid curling up on your side with your arms and knees near your chest (fetal position).  If you have pain with activities such as standing for a long time or reaching with your arms, lie with your spine in a neutral position and bend your knees slightly. Try the following positions: ? Lying on your side with a pillow between your knees. ? Lying on your back with a pillow under your knees. Lifting   When lifting objects, keep your feet at least shoulder width apart and tighten your abdominal muscles.   Bend your knees and hips and keep your spine neutral. It is important to lift using the strength of your legs, not your back. Do not lock your knees straight out.  Always ask for help to lift heavy or awkward objects. This information is not intended to replace advice given to you by your health care provider. Make sure you discuss any questions you have with your health care provider. Document Released: 12/11/2005 Document Revised: 04/04/2019 Document Reviewed: 01/02/2019 Elsevier Patient Education  2020 [employer].

## 2019-06-24 NOTE — Progress Notes (Addendum)
Name: April Luna   MRN: [id number]    DOB: [birth year]   Date:06/24/2019       Progress Note  Subjective  Chief Complaint  Chief Complaint  Patient presents with  . Follow-up  . Back Pain    need referral     HPI  Diabetes mellitus type 2 Checking sugars?  no   Have they attended Diabetes education classes? yes  Trying to limit white bread, white rice, white potatoes, sweets?  yes Trying to limit sweetened drinks like iced tea, soft drinks, sports drinks, fruit juices?  yes Checking feet every day/night?  yes Last eye exam:  Due for this - will refer today Denies: Polyuria, polydipsia, polyphagia, vision changes, or neuropathy. Most recent A1C:  Lab Results  Component Value Date   HGBA1C 6.5 01/02/2019    We will recheck today. Last CMP Results : is due for repeat today    Component Value Date/Time   NA 137 09/02/2018 1530   NA 139 04/20/2015 0640   K 3.8 09/02/2018 1530   K 3.9 04/20/2015 0640   CL 100 09/02/2018 1530   CL 105 04/20/2015 0640   CO2 26 09/02/2018 1530   CO2 27 04/20/2015 0640   GLUCOSE 94 09/02/2018 1530   GLUCOSE 95 04/20/2015 0640   BUN 14 09/02/2018 1530   BUN 12 04/20/2015 0640   CREATININE 1.13 (H) 09/02/2018 1530   CALCIUM 9.6 09/02/2018 1530   CALCIUM 9.2 04/20/2015 0640   PROT 7.0 09/02/2018 1530   PROT 6.5 04/20/2015 0640   ALBUMIN 4.1 06/30/2015 2002   ALBUMIN 3.7 04/20/2015 0640   AST 14 09/02/2018 1530   AST 29 04/20/2015 0640   ALT 10 09/02/2018 1530   ALT 30 04/20/2015 0640   ALKPHOS 90 06/30/2015 2002   ALKPHOS 80 04/20/2015 0640   BILITOT 0.4 09/02/2018 1530   BILITOT 0.2 (L) 04/20/2015 0640   GFRNONAA 59 (L) 09/02/2018 1530   GFRAA 68 09/02/2018 1530   Urine Micro UTD? Yes Current Medication Management: Diabetic Medications: None - diet controlled at this time. ACEI/ARB: Yes Statin: Yes Aspirin therapy: No.  HLD: Taking crestor.  She denies chest pain; has shortness of breath r/t COPD.  She denies  myalgias.  Due for labs today.  COPD/Tobacco Abuse: Was taking trelegy for a few days, had oral reaction to this and returned back to the anoro. Has some shortness of breath and coughing.  Has albuterol neb and inhaler PRN. She is still smoking - 1ppd (used to smoke about 2ppd).  HTN: She is taking HCTZ and Lisinopril.  Denies chest pain; has shortness of breath as above.  Occasional lightheadedness, but BP is at goal today.  No headaches or BLE edema.  IBS: Taking amitiza 2mg and this is working well for her.  She is having daily BM's with normal consistency.  No diarrhea, abdominal pain, NV, or blood in stool.  Bipolar: Going to Psychotherapeutic Services/ACT team and is doing well. She denies SI/HI.  Taking Zoloft and Welbutrin; also taking hydroxyzine PRN.  She is seeing her therapist later today, also seeing psychiatry.    Recurrent UTI/Nephrolithiasis: Seeing Dr. SDiamantina Providenceand has follow up in July.  She notes ongoing symptoms for several months now but was afraid to come to doctor due to [place].  She notes dysuria, urinary frequency, some sediment in the urine.  No back/flank pain or abdominal pain, no blood in urine.  Does have history of kidney stones and advised to  monitor for worsening symptoms.   Hypothyroidism: On 59mg synthroid; due for recheck today.  Denies chest pain, palpitations, heat/cold intolerance, hair/skin/nail changes, diarrhea.  Constipation is controlled with amitza. Has dizziness occasionally.   Sciatic Nerve Pain/Low Back Pain: Taking gabapentin 3069mBID which she does not feel is very effective, but we will keep this dose while she works on core strengthening. She is doing stretches daily but not strengthening - discussed in detail  Patient Active Problem List   Diagnosis Date Noted  . ASCUS of cervix with negative high risk HPV 09/05/2018  . Pre-diabetes 06/05/2018  . GERD without esophagitis 04/11/2018  . Hyperlipidemia 04/11/2018  . Hypertension  04/11/2018  . Hypothyroidism 04/26/2015  . Bipolar I disorder, most recent episode (or current) manic ([place]/2014  . Delirium due to another medical condition 04/25/2015  . Cannabis abuse 04/25/2015    Past Surgical History:  Procedure Laterality Date  . CESAREAN SECTION    . CYSTOSCOPY W/ RETROGRADES Left 11/08/2018   Procedure: CYSTOSCOPY WITH RETROGRADE PYELOGRAM;  Surgeon: SnBilley CoMD;  Location: [facility] ORS;  Service: Urology;  Laterality: Left;  . CYSTOSCOPY/URETEROSCOPY/HOLMIUM LASER/STENT PLACEMENT Left 11/08/2018   Procedure: CYSTOSCOPY/URETEROSCOPY/HOLMIUM LASER/STENT PLACEMENT;  Surgeon: SnBilley CoMD;  Location: [facility] ORS;  Service: Urology;  Laterality: Left;  . MOUTH SURGERY     wisdom teeth extraction    Family History  Problem Relation Age of Onset  . Depression Mother   . Anxiety disorder Mother   . Diabetes Mother   . Hypertension Mother   . Hyperlipidemia Mother   . Cancer Mother   . Cancer Father        colon cancer  . Depression Brother   . Anxiety disorder Brother   . Diabetes Mellitus II Maternal Grandmother   . Hypercholesterolemia Maternal Grandmother   . Cancer Paternal Grandmother   . Diabetes Paternal Grandmother     Social History   Socioeconomic History  . Marital status: Married    Spouse name: BeMontine Circle. Number of children: 1  . Years of education: 1232. Highest education level: High school graduate  Occupational History  . Occupation: unemployed    Comment: disabled  Social Needs  . Financial resource strain: Somewhat hard  . Food insecurity    Worry: Sometimes true    Inability: Never true  . Transportation needs    Medical: Yes    Non-medical: Yes  Tobacco Use  . Smoking status: Current Every Day Smoker    Packs/day: 1.00    Years: 34.00    Pack years: 34.00    Types: Cigarettes    Start date: 05/13/1985  . Smokeless tobacco: Never Used  Substance and Sexual Activity  . Alcohol use: Not  Currently    Alcohol/week: 0.0 standard drinks    Comment: rarely  . Drug use: Not Currently    Types: Marijuana    Comment: none since August 2019  . Sexual activity: Not Currently    Birth control/protection: Post-menopausal  Lifestyle  . Physical activity    Days per week: 0 days    Minutes per session: 0 min  . Stress: To some extent  Relationships  . Social coHerbalistn phone: Three times a week    Gets together: Once a week    Attends religious service: Never    Active member of club or organization: No    Attends meetings of clubs or organizations: Never  Relationship status: Married  . Intimate partner violence    Fear of current or ex partner: No    Emotionally abused: No    Physically abused: No    Forced sexual activity: No  Other Topics Concern  . Not on file  Social History Narrative  . Not on file     Current Outpatient Medications:  .  albuterol (PROVENTIL) (2.5 MG/3ML) 0.083% nebulizer solution, Take 3 mLs (2.5 mg total) by nebulization every 6 (six) hours as needed for wheezing or shortness of breath., Disp: 150 mL, Rfl: 1 .  amantadine (SYMMETREL) 100 MG capsule, Take 100 mg by mouth 2 (two) times daily. , Disp: , Rfl:  .  buPROPion (WELLBUTRIN XL) 150 MG 24 hr tablet, Take 150 mg by mouth daily. , Disp: , Rfl:  .  Cholecalciferol (VITAMIN D-3) 125 MCG (5000 UT) TABS, Take 5,000 Units by mouth daily., Disp: , Rfl:  .  gabapentin (NEURONTIN) 300 MG capsule, Take 300 mg by mouth 2 (two) times daily., Disp: , Rfl:  .  hydrochlorothiazide (HYDRODIURIL) 12.5 MG tablet, Take 1 tablet (12.5 mg total) by mouth daily., Disp: 30 tablet, Rfl: 5 .  hydrOXYzine (VISTARIL) 50 MG capsule, Take 50 mg by mouth 2 (two) times daily as needed for anxiety., Disp: , Rfl:  .  levothyroxine (SYNTHROID, LEVOTHROID) 75 MCG tablet, Take 1 tablet (75 mcg total) by mouth daily at 6 (six) AM., Disp: 30 tablet, Rfl: 5 .  lisinopril (ZESTRIL) 5 MG tablet, Take 1 tablet (5 mg  total) by mouth daily., Disp: 90 tablet, Rfl: 0 .  lubiprostone (AMITIZA) 24 MCG capsule, Take 1 capsule (24 mcg total) by mouth daily with breakfast., Disp: 30 capsule, Rfl: 5 .  oxybutynin (DITROPAN-XL) 10 MG 24 hr tablet, Take 1 tablet (10 mg total) by mouth daily., Disp: 90 tablet, Rfl: 3 .  Paliperidone Palmitate ER (INVEGA TRINZA) 819 MG/2.625ML SUSY, Inject 819 mg into the muscle every 3 (three) months., Disp: , Rfl:  .  pantoprazole (PROTONIX) 40 MG tablet, Take 1 tablet (40 mg total) by mouth daily at 6 (six) AM., Disp: 30 tablet, Rfl: 5 .  prazosin (MINIPRESS) 2 MG capsule, Take 2 mg by mouth at bedtime., Disp: , Rfl:  .  rosuvastatin (CRESTOR) 20 MG tablet, Take 1 tablet (20 mg total) by mouth at bedtime. In place of pravastatin, Disp: 90 tablet, Rfl: 0 .  sertraline (ZOLOFT) 100 MG tablet, Take 200 mg by mouth daily. , Disp: , Rfl:  .  umeclidinium-vilanterol (ANORO ELLIPTA) 62.5-25 MCG/INH AEPB, Inhale 1 puff into the lungs daily., Disp: 1 each, Rfl: 3 .  blood glucose meter kit and supplies, Dispense based on patient and insurance preference. Use up to four times daily as directed. (FOR ICD-10 E10.9, E11.9). (Patient not taking: Reported on 06/24/2019), Disp: 1 each, Rfl: 0 .  nystatin (MYCOSTATIN) 100000 UNIT/ML suspension, Take 5 mLs (500,000 Units total) by mouth 4 (four) times daily. (Patient not taking: Reported on 06/24/2019), Disp: 280 mL, Rfl: 0  Allergies  Allergen Reactions  . Penicillins Other (See Comments)    GI distress Has patient had a PCN reaction causing immediate rash, facial/tongue/throat swelling, SOB or lightheadedness with hypotension: No Has patient had a PCN reaction causing severe rash involving mucus membranes or skin necrosis: No Has patient had a PCN reaction that required hospitalization: No Has patient had a PCN reaction occurring within the last 10 years: No If all of the above answers are "NO", then may proceed  with Cephalosporin use.   Marland Kitchen  Perphenazine Other (See Comments)    Tremors, muscle weakness, tongue swelling  . Sulfa Antibiotics Other (See Comments)    GI distress   . Abilify [Aripiprazole] Rash    I personally reviewed active problem list, medication list, allergies, notes from last encounter, lab results with the patient/caregiver today.   ROS  Constitutional: Negative for fever or weight change.  Respiratory: Negative for cough and shortness of breath.   Cardiovascular: Negative for chest pain or palpitations.  Gastrointestinal: Negative for abdominal pain, no bowel changes.  Musculoskeletal: Negative for gait problem or joint swelling.  Skin: Negative for rash.  Neurological: Negative for dizziness or headache.  No other specific complaints in a complete review of systems (except as listed in HPI above).  Objective  Vitals:   06/24/19 0834  BP: 130/78  Pulse: 100  Resp: 16  Temp: (!) 97.1 F (36.2 C)  TempSrc: Oral  SpO2: 94%  Weight: 150 lb 6.4 oz (68.2 kg)  Height: '4\' 11"'  (1.499 m)   Body mass index is 30.38 kg/m.  Physical Exam  Constitutional: Patient appears well-developed and well-nourished. No distress.  HENT: Head: Normocephalic and atraumatic. Eyes: Conjunctivae and EOM are normal. No scleral icterus. Neck: Normal range of motion. Neck supple. No JVD present. No thyromegaly present.  Cardiovascular: Normal rate, regular rhythm and normal heart sounds.  No murmur heard. No BLE edema. Pulmonary/Chest: Effort normal and breath sounds with inspiratory and expiratory wheezes. No respiratory distress. Musculoskeletal: Normal range of motion, no joint effusions. No gross deformities Neurological: Pt is alert and oriented to person, place, and time. No cranial nerve deficit. Coordination, balance, strength, speech and gait are normal.  Skin: Skin is warm and dry. No rash noted. No erythema.  Psychiatric: Patient has a normal mood and affect. behavior is normal. Judgment and thought  content normal.  No results found for this or any previous visit (from the past 72 hour(s)).  PHQ2/9: Depression screen [facility] 2/9 06/24/2019 02/04/2019 01/28/2019 01/10/2019 01/02/2019  Decreased Interest '3 1 1 1 1  ' Down, Depressed, Hopeless '1 1 3 3 1  ' PHQ - 2 Score '4 2 4 4 2  ' Altered sleeping 0 0 '2 3 2  ' Tired, decreased energy '1 3 2 2 3  ' Change in appetite 0 '2 2 2 2  ' Feeling bad or failure about yourself  0 '2 1 1 ' 0  Trouble concentrating 2 2 0 0 2  Moving slowly or fidgety/restless 0 2 0 0 1  Suicidal thoughts 0 0 0 0 0  PHQ-9 Score '7 13 11 12 12  ' Difficult doing work/chores Somewhat difficult Somewhat difficult Somewhat difficult Somewhat difficult Somewhat difficult   PHQ-2/9 Result is positive.    Fall Risk: Fall Risk  06/24/2019 02/04/2019 01/28/2019 01/02/2019 09/02/2018  Falls in the past year? 0 - 0 0 No  Number falls in past yr: 0 0 0 0 -  Injury with Fall? 0 0 0 0 -  Follow up Falls evaluation completed - Falls evaluation completed - -     Assessment & Plan  1. Type 2 diabetes mellitus with microalbuminuria, without long-term current use of insulin (HCC) - blood glucose meter kit and supplies; Dispense based on patient and insurance preference. Use up to four times daily as directed. (FOR ICD-10 E10.9, E11.9).  Dispense: 1 each; Refill: 0 - Ambulatory referral to Ophthalmology - COMPLETE METABOLIC PANEL WITH GFR - Hemoglobin A1c  2. Morbid obesity ([place]) - Discussed importance  of 150 minutes of physical activity weekly, eat two servings of fish weekly, eat one serving of tree nuts ( cashews, pistachios, pecans, almonds.Marland Kitchen) every other day, eat 6 servings of fruit/vegetables daily and drink plenty of water and avoid sweet beverages.   3. Hyperlipidemia, unspecified hyperlipidemia type - Lipid panel  4. Chronic obstructive pulmonary disease, unspecified COPD type ([place]) - Symbicort to replace anoro; discussed proper cleaning to prevent thrush and rinsing mouth after use.  5.  Tobacco abuse counseling - Not ready to quit but has cut back  6. Essential hypertension - hydrochlorothiazide (HYDRODIURIL) 12.5 MG tablet; Take 1 tablet (12.5 mg total) by mouth daily.  Dispense: 30 tablet; Refill: 5 - Continue Lisinopril   7. Chronic constipation - lubiprostone (AMITIZA) 24 MCG capsule; Take 1 capsule (24 mcg total) by mouth daily with breakfast.  Dispense: 30 capsule; Refill: 5  8. Bipolar I disorder, most recent episode (or current) manic ([place]) - Keep follow up with ACT team/psychiatry  9. Recurrent UTI - Urine Culture  10. Hypothyroidism due to medication - TSH  11. Chronic bilateral low back pain with right-sided sciatica - Continue gabapentin, back exercises per AVS

## 2019-06-24 NOTE — Assessment & Plan Note (Signed)
Due to taking lithium

## 2019-06-26 LAB — LIPID PANEL
Cholesterol: 286 mg/dL — ABNORMAL HIGH (ref <200)
HDL: 44 mg/dL — ABNORMAL LOW (ref >=50)
LDL Cholesterol (Calc): 189 mg/dL (calc) — ABNORMAL HIGH
Non-HDL Cholesterol (Calc): 242 mg/dL (calc) — ABNORMAL HIGH (ref <130)
Total CHOL/HDL Ratio: 6.5 (calc) — ABNORMAL HIGH (ref <5.0)
Triglycerides: 315 mg/dL — ABNORMAL HIGH (ref <150)

## 2019-06-26 LAB — URINE CULTURE
MICRO NUMBER:: 622486
SPECIMEN QUALITY:: ADEQUATE

## 2019-06-26 LAB — COMPLETE METABOLIC PANEL WITH GFR
AG Ratio: 1.7 (calc) (ref 1.0–2.5)
ALT: 9 U/L (ref 6–29)
AST: 14 U/L (ref 10–35)
Albumin: 4.5 g/dL (ref 3.6–5.1)
Alkaline phosphatase (APISO): 97 U/L (ref 31–125)
BUN/Creatinine Ratio: 10 (calc) (ref 6–22)
BUN: 13 mg/dL (ref 7–25)
CO2: 26 mmol/L (ref 20–32)
Calcium: 10 mg/dL (ref 8.6–10.2)
Chloride: 102 mmol/L (ref 98–110)
Creat: 1.28 mg/dL — ABNORMAL HIGH (ref 0.50–1.10)
GFR, Est African American: 58 mL/min/{1.73_m2} — ABNORMAL LOW (ref >=60)
GFR, Est Non African American: 50 mL/min/{1.73_m2} — ABNORMAL LOW (ref >=60)
Globulin: 2.6 g/dL (calc) (ref 1.9–3.7)
Glucose, Bld: 106 mg/dL — ABNORMAL HIGH (ref 65–99)
Potassium: 4 mmol/L (ref 3.5–5.3)
Sodium: 139 mmol/L (ref 135–146)
Total Bilirubin: 0.4 mg/dL (ref 0.2–1.2)
Total Protein: 7.1 g/dL (ref 6.1–8.1)

## 2019-06-26 LAB — TSH: TSH: 2.66 mIU/L

## 2019-06-26 LAB — HEMOGLOBIN A1C
Hgb A1c MFr Bld: 6 % of total Hgb — ABNORMAL HIGH (ref <5.7)
Mean Plasma Glucose: 126 (calc)
eAG (mmol/L): 7 (calc)

## 2019-07-10 ENCOUNTER — Encounter: Payer: Self-pay | Admitting: Urology

## 2019-07-10 ENCOUNTER — Ambulatory Visit (INDEPENDENT_AMBULATORY_CARE_PROVIDER_SITE_OTHER): Payer: Medicare Other | Admitting: Urology

## 2019-07-10 ENCOUNTER — Ambulatory Visit
Admission: RE | Admit: 2019-07-10 | Discharge: 2019-07-10 | Disposition: A | Discharge status: Home / Self Care | Payer: Medicare Other | Source: Ambulatory Visit | Attending: Urology | Admitting: Urology

## 2019-07-10 ENCOUNTER — Other Ambulatory Visit: Payer: Self-pay

## 2019-07-10 VITALS — BP 114/77 | HR 123 | Ht 59.0 in | Wt 148.0 lb

## 2019-07-10 DIAGNOSIS — N200 Calculus of kidney: Secondary | ICD-10-CM | POA: Insufficient documentation

## 2019-07-10 LAB — MICROSCOPIC EXAMINATION

## 2019-07-10 LAB — URINALYSIS, COMPLETE
Bilirubin, UA: NEGATIVE
Glucose, UA: NEGATIVE
Ketones, UA: NEGATIVE
Leukocytes,UA: NEGATIVE
Nitrite, UA: NEGATIVE
Protein,UA: NEGATIVE
Specific Gravity, UA: 1.015 (ref 1.005–1.030)
Urobilinogen, Ur: 0.2 mg/dL (ref 0.2–1.0)
pH, UA: 6 (ref 5.0–7.5)

## 2019-07-10 NOTE — Progress Notes (Signed)
   07/10/2019 3:14 PM   Park Breed Barrette 1971/12/21 [id number]  Reason for visit: Follow up nephrolithiasis  HPI: I saw Ms. Hiller in urology clinic today in follow-up for nephrolithiasis.  She is a 48 year old female who underwent left ureteroscopy and laser lithotripsy on 11/08/2018 for a very large 2 cm left UPJ stone.  She did well, and follow-up ultrasound showed resolution of her prior hydronephrosis.  She has had some mild bladder pain recently with urination, however denies any flank pain or gross hematuria.  Overall she is doing well.  She remains on oxybutynin daily for OAB symptoms.  Her urinalysis today is relatively benign with 0-5 WBCs, 0 RBCs, few bacteria, no yeast, nitrite negative.  KUB today shows some small residual fragments in the left lower pole of the kidney  We discussed general stone prevention strategies including adequate hydration with goal of producing 2.5 L of urine daily, increasing citric acid intake, increasing calcium intake during high oxalate meals, minimizing animal protein, and decreasing salt intake. Information about dietary recommendations given today.   RTC 1 year with KUB Follow-up urine culture  A total of 15 minutes were spent face-to-face with the patient, greater than 50% was spent in patient education, counseling, and coordination of care regarding nephrolithiasis and OAB.  Billey Co, [facility] [address] [place], [place] [address] [phone]

## 2019-07-10 NOTE — Patient Instructions (Signed)
Dietary Guidelines to Help Prevent Kidney Stones Kidney stones are deposits of minerals and salts that form inside your kidneys. Your risk of developing kidney stones may be greater depending on your diet, your lifestyle, the medicines you take, and whether you have certain medical conditions. Most people can reduce their chances of developing kidney stones by following the instructions below. Depending on your overall health and the type of kidney stones you tend to develop, your dietitian may give you more specific instructions. What are tips for following this plan? Reading food labels  Choose foods with "no salt added" or "low-salt" labels. Limit your sodium intake to less than 1500 mg per day.  Choose foods with calcium for each meal and snack. Try to eat about 300 mg of calcium at each meal. Foods that contain 200-500 mg of calcium per serving include: ? 8 oz (237 ml) of milk, fortified nondairy milk, and fortified fruit juice. ? 8 oz (237 ml) of kefir, yogurt, and soy yogurt. ? 4 oz (118 ml) of tofu. ? 1 oz of cheese. ? 1 cup (300 g) of dried figs. ? 1 cup (91 g) of cooked broccoli. ? 1-3 oz can of sardines or mackerel.  Most people need 1000 to 1500 mg of calcium each day. Talk to your dietitian about how much calcium is recommended for you. Shopping  Buy plenty of fresh fruits and vegetables. Most people do not need to avoid fruits and vegetables, even if they contain nutrients that may contribute to kidney stones.  When shopping for convenience foods, choose: ? Whole pieces of fruit. ? Premade salads with dressing on the side. ? Low-fat fruit and yogurt smoothies.  Avoid buying frozen meals or prepared deli foods.  Look for foods with live cultures, such as yogurt and kefir. Cooking  Do not add salt to food when cooking. Place a salt shaker on the table and allow each person to add his or her own salt to taste.  Use vegetable protein, such as beans, textured vegetable  protein (TVP), or tofu instead of meat in pasta, casseroles, and soups. Meal planning   Eat less salt, if told by your dietitian. To do this: ? Avoid eating processed or premade food. ? Avoid eating fast food.  Eat less animal protein, including cheese, meat, poultry, or fish, if told by your dietitian. To do this: ? Limit the number of times you have meat, poultry, fish, or cheese each week. Eat a diet free of meat at least 2 days a week. ? Eat only one serving each day of meat, poultry, fish, or seafood. ? When you prepare animal protein, cut pieces into small portion sizes. For most meat and fish, one serving is about the size of one deck of cards.  Eat at least 5 servings of fresh fruits and vegetables each day. To do this: ? Keep fruits and vegetables on hand for snacks. ? Eat 1 piece of fruit or a handful of berries with breakfast. ? Have a salad and fruit at lunch. ? Have two kinds of vegetables at dinner.  Limit foods that are high in a substance called oxalate. These include: ? Spinach. ? Rhubarb. ? Beets. ? Potato chips and french fries. ? Nuts.  If you regularly take a diuretic medicine, make sure to eat at least 1-2 fruits or vegetables high in potassium each day. These include: ? Avocado. ? Banana. ? Orange, prune, carrot, or tomato juice. ? Baked potato. ? Cabbage. ? Beans and split   peas. General instructions   Drink enough fluid to keep your urine clear or pale yellow. This is the most important thing you can do.  Talk to your health care provider and dietitian about taking daily supplements. Depending on your health and the cause of your kidney stones, you may be advised: ? Not to take supplements with vitamin C. ? To take a calcium supplement. ? To take a daily probiotic supplement. ? To take other supplements such as magnesium, fish oil, or vitamin B6.  Take all medicines and supplements as told by your health care provider.  Limit alcohol intake to no  more than 1 drink a day for nonpregnant women and 2 drinks a day for men. One drink equals 12 oz of beer, 5 oz of wine, or 1 oz of hard liquor.  Lose weight if told by your health care provider. Work with your dietitian to find strategies and an eating plan that works best for you. What foods are not recommended? Limit your intake of the following foods, or as told by your dietitian. Talk to your dietitian about specific foods you should avoid based on the type of kidney stones and your overall health. Grains Breads. Bagels. Rolls. Baked goods. Salted crackers. Cereal. Pasta. Vegetables Spinach. Rhubarb. Beets. Canned vegetables. Angie Fava. Olives. Meats and other protein foods Nuts. Nut butters. Large portions of meat, poultry, or fish. Salted or cured meats. Deli meats. Hot dogs. Sausages. Dairy Cheese. Beverages Regular soft drinks. Regular vegetable juice. Seasonings and other foods Seasoning blends with salt. Salad dressings. Canned soups. Soy sauce. Ketchup. Barbecue sauce. Canned pasta sauce. Casseroles. Pizza. Lasagna. Frozen meals. Potato chips. [place] fries. Summary  You can reduce your risk of kidney stones by making changes to your diet.  The most important thing you can do is drink enough fluid. You should drink enough fluid to keep your urine clear or pale yellow.  Ask your health care provider or dietitian how much protein from animal sources you should eat each day, and also how much salt and calcium you should have each day. This information is not intended to replace advice given to you by your health care provider. Make sure you discuss any questions you have with your health care provider. Document Released: 04/07/2011 Document Revised: 04/02/2019 Document Reviewed: 11/21/2016 Elsevier Patient Education  2020 [employer].

## 2019-07-12 LAB — URINE CULTURE

## 2019-07-21 ENCOUNTER — Other Ambulatory Visit: Payer: Self-pay

## 2019-07-21 MED ORDER — VITAMIN D-3 125 MCG (5000 UT) PO TABS: 5000.0000 [IU] | ORAL_TABLET | Freq: Every day | ORAL | 1 refills | Status: DC

## 2019-07-21 NOTE — Telephone Encounter (Signed)
Refill request for general medication. Vitamin D to [place] office visit 06/24/2019   Follow up on 09/24/2019

## 2019-08-08 ENCOUNTER — Encounter: Payer: Self-pay | Admitting: Emergency Medicine

## 2019-08-08 ENCOUNTER — Emergency Department
Admission: EM | Admit: 2019-08-08 | Discharge: 2019-08-08 | Disposition: A | Discharge status: Home / Self Care | Payer: Medicare Other | Attending: Emergency Medicine | Admitting: Emergency Medicine

## 2019-08-08 ENCOUNTER — Other Ambulatory Visit: Payer: Self-pay

## 2019-08-08 DIAGNOSIS — E119 Type 2 diabetes mellitus without complications: Secondary | ICD-10-CM | POA: Diagnosis not present

## 2019-08-08 DIAGNOSIS — K029 Dental caries, unspecified: Secondary | ICD-10-CM | POA: Insufficient documentation

## 2019-08-08 DIAGNOSIS — I10 Essential (primary) hypertension: Secondary | ICD-10-CM | POA: Diagnosis not present

## 2019-08-08 DIAGNOSIS — F17210 Nicotine dependence, cigarettes, uncomplicated: Secondary | ICD-10-CM | POA: Diagnosis not present

## 2019-08-08 DIAGNOSIS — Z79899 Other long term (current) drug therapy: Secondary | ICD-10-CM | POA: Diagnosis not present

## 2019-08-08 DIAGNOSIS — J449 Chronic obstructive pulmonary disease, unspecified: Secondary | ICD-10-CM | POA: Diagnosis not present

## 2019-08-08 DIAGNOSIS — K0889 Other specified disorders of teeth and supporting structures: Secondary | ICD-10-CM | POA: Diagnosis present

## 2019-08-08 DIAGNOSIS — E039 Hypothyroidism, unspecified: Secondary | ICD-10-CM | POA: Insufficient documentation

## 2019-08-08 MED ORDER — NAPROXEN 500 MG PO TABS
500.0000 mg | ORAL_TABLET | Freq: Once | ORAL | Status: DC
  Filled 2019-08-08: qty 1

## 2019-08-08 MED ORDER — OXYCODONE HCL 5 MG PO TABS
5.0000 mg | ORAL_TABLET | Freq: Once | ORAL | Status: AC
  Administered 2019-08-08: 5 mg via ORAL
  Filled 2019-08-08: qty 1

## 2019-08-08 MED ORDER — HYDROCODONE-ACETAMINOPHEN 5-325 MG PO TABS: 1.0000 | ORAL_TABLET | Freq: Four times a day (QID) | ORAL | 0 refills | Status: DC | PRN

## 2019-08-08 MED ORDER — CLINDAMYCIN HCL 150 MG PO CAPS: ORAL_CAPSULE | ORAL | 0 refills | Status: DC

## 2019-08-08 MED ORDER — NAPROXEN 500 MG PO TABS: 500.0000 mg | ORAL_TABLET | Freq: Two times a day (BID) | ORAL | 0 refills | Status: DC

## 2019-08-08 NOTE — ED Triage Notes (Signed)
Patient presents to the ED with left back dental pain.  Patient states, "it's been broken and the gum it growing over it, but it's causing a lot of pain."  Patient states the pain started 2 days ago.

## 2019-08-08 NOTE — Discharge Instructions (Signed)
Follow-up with a dentist  on the list.  Begin taking antibiotics as directed and the naproxen is 500 mg twice daily with food as needed for inflammation and pain.  OPTIONS FOR DENTAL FOLLOW UP CARE  [facility] and [place] [email or web address] [phone])  [facility] [phone])  [place] [phone])  [place] [phone])  [facility] [phone]) This clinic caters to the indigent population and is on a lottery system. Location: [facility], [place], [place], [place] [facility] Hours: Wednesdays from 6pm - 9pm, patients seen by a lottery system. For dates, call or go to [email or web address] Services: Cleanings, fillings and simple extractions. Payment Options: DENTAL WORK IS FREE OF CHARGE. Bring proof of income or support. Best way to get seen: Arrive at 5:15 pm - this is a lottery, NOT first come/first serve, so arriving earlier will not increase your chances of being seen.     [facility] [phone] Select option 1 for emergencies   Location: [facility], [place], [address] [place] [facility] Hours: No walk-ins accepted - call the day before to schedule an appointment. Check in times are 9:30 am and 1:30 pm. Services: Simple extractions, temporary fillings, pulpectomy/pulp debridement, uncomplicated abscess drainage. Payment Options: PAYMENT IS DUE AT THE TIME OF SERVICE.  Fee is usually $100-200, additional surgical procedures (e.g. abscess drainage) may be extra. Cash, checks, Visa/MasterCard accepted.  Can file Medicaid if patient is covered for dental - patient should call case worker to check. No discount for [facility] patients. Best way to get seen: MUST  call the day before and get onto the schedule. Can usually be seen the next 1-2 days. No walk-ins accepted.     [place] [phone]   Location: [place], [place] [facility] Hours: M, W, Th, F 8am or 1:30pm, Tues 9a or 1:30 - first come/first served. Services: Simple extractions, temporary fillings, uncomplicated abscess drainage.  You do not need to be an [facility] resident. Payment Options: PAYMENT IS DUE AT THE TIME OF SERVICE. Dental insurance, otherwise sliding scale - bring proof of income or support. Depending on income and treatment needed, cost is usually $50-200. Best way to get seen: Arrive early as it is first come/first served.     [facility] [phone]   Location: [place] [facility] Hours: Mon-Thu 8a-5p Services: Most basic dental services including extractions and fillings. Payment Options: PAYMENT IS DUE AT THE TIME OF SERVICE. Sliding scale, up to 50% off - bring proof if income or support. Medicaid with dental option accepted. Best way to get seen: Call to schedule an appointment, can usually be seen within 2 weeks OR they will try to see walk-ins - show up at [place] or 2p (you may have to wait).     [facility] [place] RESIDENTS ONLY   Location: [facility], [place] [address] [place], [place] [address] Clinic Hours: By appointment only. Monday - Thursday 8am-5pm, Friday 8am-12pm Services: Cleanings, fillings, extractions. Payment Options: PAYMENT IS DUE AT THE TIME OF SERVICE. Cash, Visa or MasterCard. Sliding scale - $30 minimum per service. Best way to get seen: Come in to office, complete packet and make an appointment - need proof of income or support monies for each household  member and proof of [facility] residence. Usually takes about a month to get in.     [facility] [phone]   Location: [address], [place] Clinic Hours: Walk-in Urgent Care Dental Services are offered Monday-Friday mornings only. The numbers of emergencies accepted daily is limited to the number of providers available. Maximum 15 - Mondays, Wednesdays & Thursdays Maximum 10 - Tuesdays & Fridays Services: You do not need to be a [facility] resident to be seen for a dental emergency. Emergencies are defined as pain, swelling, abnormal bleeding, or dental trauma. Walkins will receive x-rays if needed. NOTE: Dental cleaning is not an emergency. Payment Options: PAYMENT IS DUE AT THE TIME OF SERVICE. Minimum co-pay is $40.00 for uninsured patients. Minimum co-pay is $3.00 for Medicaid with dental coverage. Dental Insurance is accepted and must be presented at time of visit. Medicare does not cover dental. Forms of payment: Cash, credit card, checks. Best way to get seen: If not previously registered with the clinic, walk-in dental registration begins at 7:15 am and is on a first come/first serve basis. If previously registered with the clinic, call to make an appointment.     [facility] [place] ONLY   Location: [address], [place], [place] Clinic Hours: Mon-Thu 10a-2p Services: Extractions only! Payment Options: FREE (donations accepted) - bring proof of income or support Best way to get seen: Call and schedule an appointment OR come at 8am on the 1st Monday of every month (except for holidays) when it is first come/first served.     [facility] [phone]   Location: [place], [place] Clinic Hours: Friday mornings Services, Payment Options, Best way to get seen: Call for info

## 2019-08-08 NOTE — ED Provider Notes (Signed)
[facility] Emergency Department Provider Note   ____________________________________________   First MD Initiated Contact with Patient 08/08/19 1825     (approximate)  I have reviewed the triage vital signs and the nursing notes.   HISTORY  Chief Complaint Dental Pain   HPI April Luna is a 48 y.o. female presents to the ED with complaint of dental pain.  Patient states that acutely it is only been hurting for several days however her teeth have been rotten for several years.  She denies any fever or chills.  She has not taken any over-the-counter medication.  She rates her pain as 7 out of 10.      Past Medical History:  Diagnosis Date   Allergy    pollen   Anxiety    Bipolar 1 disorder ([place])    COPD (chronic obstructive pulmonary disease) ([place])    Depression    Family history of adverse reaction to anesthesia    grand father had a stroke during anesthesia   GERD (gastroesophageal reflux disease)    History of kidney stones    Hyperlipidemia    Hypertension    Hypothyroidism    Pneumonia    Type 2 diabetes mellitus with microalbuminuria, without long-term current use of insulin ([place]) 06/24/2019    Patient Active Problem List   Diagnosis Date Noted   Morbid obesity ([place]) 06/24/2019   Type 2 diabetes mellitus with microalbuminuria, without long-term current use of insulin ([place]) 06/24/2019   Chronic obstructive pulmonary disease ([place]) 06/24/2019   Chronic constipation 06/24/2019   ASCUS of cervix with negative high risk HPV 09/05/2018   GERD without esophagitis 04/11/2018   Hyperlipidemia 04/11/2018   Hypertension 04/11/2018   Hypothyroidism 04/26/2015   Bipolar I disorder, most recent episode (or current) manic ([place]) 04/25/2015   Delirium due to another medical condition 04/25/2015   Cannabis abuse 04/25/2015    Past Surgical History:  Procedure Laterality Date   CESAREAN SECTION     CYSTOSCOPY W/  RETROGRADES Left 11/08/2018   Procedure: CYSTOSCOPY WITH RETROGRADE PYELOGRAM;  Surgeon: Billey Co, MD;  Location: [facility] ORS;  Service: Urology;  Laterality: Left;   CYSTOSCOPY/URETEROSCOPY/HOLMIUM LASER/STENT PLACEMENT Left 11/08/2018   Procedure: CYSTOSCOPY/URETEROSCOPY/HOLMIUM LASER/STENT PLACEMENT;  Surgeon: Billey Co, MD;  Location: [facility] OR[facility];  Service: Urology;  Laterality: Left;   MOUTH SURGERY     wisdom teeth extraction    Prior to Admission medications   Medication Sig Start Date End Date Taking? Authorizing Provider  albuterol (PROVENTIL) (2.5 MG/3ML) 0.083% nebulizer solution Take 3 mLs (2.5 mg total) by nebulization every 6 (six) hours as needed for wheezing or shortness of breath. 02/04/19   Poulose, [place] Born, NP  amantadine (SYMMETREL) 100 MG capsule Take 100 mg by mouth 2 (two) times daily.  03/19/18   [provider]  blood glucose meter kit and supplies Dispense based on patient and insurance preference. Use up to four times daily as directed. (FOR ICD-10 E10.9, E11.9). 06/24/19   Hubbard Hartshorn, FNP  budesonide-formoterol (SYMBICORT) 160-4.5 MCG/ACT inhaler Inhale 2 puffs into the lungs 2 (two) times daily. 06/24/19   Hubbard Hartshorn, FNP  buPROPion (WELLBUTRIN XL) 150 MG 24 hr tablet Take 150 mg by mouth daily.  04/09/18   [provider]  Cholecalciferol (VITAMIN D-3) 125 MCG (5000 UT) TABS Take 5,000 Units by mouth daily. 07/21/19   Steele Sizer, MD  clindamycin (CLEOCIN) 150 MG capsule Take 2 tablets tid 08/08/19   Letitia Neri  L, PA-C  gabapentin (NEURONTIN) 300 MG capsule Take 300 mg by mouth 2 (two) times daily.    [provider]  hydrochlorothiazide (HYDRODIURIL) 12.5 MG tablet Take 1 tablet (12.5 mg total) by mouth daily. 06/24/19   Hubbard Hartshorn, FNP  HYDROcodone-acetaminophen (NORCO/VICODIN) 5-325 MG tablet Take 1 tablet by mouth every 6 (six) hours as needed for moderate pain. 08/08/19   Johnn Hai, PA-C  hydrOXYzine  (VISTARIL) 50 MG capsule Take 50 mg by mouth 2 (two) times daily as needed for anxiety.    [provider]  levothyroxine (SYNTHROID, LEVOTHROID) 75 MCG tablet Take 1 tablet (75 mcg total) by mouth daily at 6 (six) AM. 01/02/19   Ancil Boozer, Drue Stager, MD  lisinopril (ZESTRIL) 5 MG tablet Take 1 tablet (5 mg total) by mouth daily. 05/27/19   Steele Sizer, MD  lubiprostone (AMITIZA) 24 MCG capsule Take 1 capsule (24 mcg total) by mouth daily with breakfast. 06/24/19   Hubbard Hartshorn, FNP  oxybutynin (DITROPAN-XL) 10 MG 24 hr tablet Take 1 tablet (10 mg total) by mouth daily. 01/06/19   Billey Co, MD  Paliperidone Palmitate ER (INVEGA TRINZA) 819 MG/2.625ML SUSY Inject 819 mg into the muscle every 3 (three) months.    [provider]  pantoprazole (PROTONIX) 40 MG tablet Take 1 tablet (40 mg total) by mouth daily at 6 (six) AM. 01/02/19   Ancil Boozer, Drue Stager, MD  prazosin (MINIPRESS) 2 MG capsule Take 2 mg by mouth at bedtime.    [provider]  rosuvastatin (CRESTOR) 20 MG tablet Take 1 tablet (20 mg total) by mouth at bedtime. In place of pravastatin 05/27/19   Steele Sizer, MD  sertraline (ZOLOFT) 100 MG tablet Take 200 mg by mouth daily.     [provider]    Allergies Metformin and related, Penicillins, Perphenazine, Sulfa antibiotics, and Abilify [aripiprazole]  Family History  Problem Relation Age of Onset   Depression Mother    Anxiety disorder Mother    Diabetes Mother    Hypertension Mother    Hyperlipidemia Mother    Cancer Mother    Cancer Father        colon cancer   Depression Brother    Anxiety disorder Brother    Diabetes Mellitus II Maternal Grandmother    Hypercholesterolemia Maternal Grandmother    Cancer Paternal Grandmother    Diabetes Paternal Grandmother     Social History Social History   Tobacco Use   Smoking status: Current Every Day Smoker    Packs/day: 1.00    Years: 34.00    Pack years: 34.00    Types:  Cigarettes    Start date: 05/13/1985   Smokeless tobacco: Never Used  Substance Use Topics   Alcohol use: Not Currently    Alcohol/week: 0.0 standard drinks    Comment: rarely   Drug use: Not Currently    Types: Marijuana    Comment: none since August 2019    Review of Systems Constitutional: No fever/chills Cardiovascular: Denies chest pain. Mouth: Positive for dental pain. Respiratory: Denies shortness of breath. Gastrointestinal: No abdominal pain.  No nausea, no vomiting.  Musculoskeletal: Negative for back pain. Skin: Negative for rash. Neurological: Negative for headaches, focal weakness or numbness. ____________________________________________   PHYSICAL EXAM:  VITAL SIGNS: ED Triage Vitals  Enc Vitals Group     BP 08/08/19 1811 130/89     Pulse Rate 08/08/19 1811 70     Resp 08/08/19 1811 18     Temp  08/08/19 1811 98.7 F (37.1 C)     Temp Source 08/08/19 1811 Oral     SpO2 08/08/19 1811 96 %     Weight 08/08/19 1807 145 lb (65.8 kg)     Height 08/08/19 1807 '4\' 11"'  (1.499 m)     Head Circumference --      Peak Flow --      Pain Score 08/08/19 1807 7     Pain Loc --      Pain Edu? --      Excl. in [place]? --    Constitutional: Alert and oriented. Well appearing and in no acute distress. Eyes: Conjunctivae are normal.  Head: Atraumatic. Nose: No congestion/rhinnorhea. Mouth/Throat: upper left teeth are in extremely poor repair and most of them have dental caries down to the gum area.  No obvious abscess was seen or drainage.  Remaining teeth are in extremely poor repair. Neck: No stridor.   Cardiovascular: Normal rate, regular rhythm. Grossly normal heart sounds.  Good peripheral circulation. Respiratory: Normal respiratory effort.  No retractions. Lungs CTAB. Musculoskeletal: Moves upper and lower extremities without any difficulty normal gait was noted. Neurologic:  Normal speech and language. No gross focal neurologic deficits are appreciated. No gait  instability. Skin:  Skin is warm, dry and intact. No rash noted. Psychiatric: Mood and affect are normal. Speech and behavior are normal.  ____________________________________________   LABS (all labs ordered are listed, but only abnormal results are displayed)  Labs Reviewed - No data to display  PROCEDURES  Procedure(s) performed (including Critical Care):  Procedures   ____________________________________________   INITIAL IMPRESSION / ASSESSMENT AND PLAN / ED COURSE  As part of my medical decision making, I reviewed the following data within the electronic MEDICAL RECORD NUMBER Notes from prior ED visits and [place] Controlled Substance Database  48 year old female presents to the ED with complaint of dental pain that acutely began hurting more severe last several days however she has had problems with her teeth for the last several years.  She denies any recent changes but states that pain is more increased.  She denies any fever or chills.  Patient was given a list of dental clinics in the area that her on a sliding scale.  She was discharged with prescription for clindamycin and Norco 1 every 6 hours as needed for pain.  ____________________________________________   FINAL CLINICAL IMPRESSION(S) / ED DIAGNOSES  Final diagnoses:  Pain due to dental caries     ED Discharge Orders         Ordered    clindamycin (CLEOCIN) 150 MG capsule     08/08/19 1840    naproxen (NAPROSYN) 500 MG tablet  2 times daily with meals,   Status:  Discontinued     08/08/19 1840    HYDROcodone-acetaminophen (NORCO/VICODIN) 5-325 MG tablet  Every 6 hours PRN     08/08/19 1903           Note:  This document was prepared using Dragon voice recognition software and may include unintentional dictation errors.    Johnn Hai, PA-C 08/08/19 1904    Nance Pear, MD 08/08/19 [phone]

## 2019-08-19 ENCOUNTER — Ambulatory Visit: Payer: Self-pay

## 2019-08-19 NOTE — Chronic Care Management (AMB) (Signed)
   Chronic Care Management   Unsuccessful Call Note 08/19/2019 Name: April Luna MRN: [id number] DOB: Apr 14, 1971  April Luna is a 48 year old female who sees Dr. Steele Sizer for primary care. [name] was referred to the chronic case management team for follow up post ED visit, by her health plan. Patient's last office visit was 06/24/2019.     Was unable to reach patient via telephone today for ED follow up and introduction to CCM services. Unfortunately, was unable to leave HIPAA compliant VM as "call cannot be completed at this time". (unsuccessful outreach #1).   Plan: Will follow-up within 7 business days via telephone.      Tera Pellicane E. Rollene Rotunda, RN, BSN Nurse Care Coordinator [facility] Care Management  [phone]

## 2019-08-20 ENCOUNTER — Ambulatory Visit: Payer: Self-pay

## 2019-08-20 NOTE — Chronic Care Management (AMB) (Signed)
   Chronic Care Management   Unsuccessful Call Note 08/19/2019 Name: [name]   MRN: [id number]       DOB: 04-05-1971  April Luna is a 48 year old femalewho sees Dr. Steele Sizer for primary care. April Luna was referred to the chronic case management team for follow up post ED visit, by her health plan. Patient's last office visit was 06/24/2019.   Was unable to reach patient via telephone today forED follow up and introduction to CCM services. Unfortunately, was unable to leave HIPAA compliant VM as "call cannot be completed at this time". (unsuccessful outreach #2).  Plan: Will follow-up within 7business days via telephone.    Savera Donson E. Rollene Rotunda, RN, BSN Nurse Care Coordinator [facility] Care Management  [phone]

## 2019-08-22 ENCOUNTER — Ambulatory Visit: Payer: Self-pay

## 2019-08-22 DIAGNOSIS — K1379 Other lesions of oral mucosa: Secondary | ICD-10-CM

## 2019-08-22 NOTE — Chronic Care Management (AMB) (Signed)
   Chronic Care Management   Unsuccessful Call Note 08/19/2019 Name:Keyosha L PaschalMRN: [id number] DOB: [birth year]  April Luna a 48year old femalewho seesDr. Laure Kidney primary care. [name] was referred to the chronic case management team for follow up post ED visit, by her health plan. Patient's last office visit was6/30/2020.   Was unable to reach patient via telephone today forED follow up and introduction to CCM services.Hipaa compliant VM message left instructing patient to contact PCP office if she has additional needs related to recent ED visit.  Plan: Third unsuccessful outreach today. Will suspend outreach calls. CCM Team will be happy to introduce CCM services if provider feels necessary and if patient returns call to office.   Apolonio Cutting E. Rollene Rotunda, RN, BSN Nurse Care Coordinator [facility] Care Management  [phone]

## 2019-08-25 ENCOUNTER — Other Ambulatory Visit: Payer: Self-pay

## 2019-08-25 DIAGNOSIS — E032 Hypothyroidism due to medicaments and other exogenous substances: Secondary | ICD-10-CM

## 2019-08-25 MED ORDER — LEVOTHYROXINE SODIUM 75 MCG PO TABS: 75.0000 ug | ORAL_TABLET | Freq: Every day | ORAL | 0 refills | Status: DC

## 2019-08-25 NOTE — Telephone Encounter (Signed)
Refill request for thyroid medication. Levothyroxine to [facility].   Last visit: 06/24/2019  Lab Results  Component Value Date   TSH 2.66 06/24/2019    Follow up on 09/24/2019

## 2019-09-03 ENCOUNTER — Other Ambulatory Visit: Payer: Self-pay

## 2019-09-03 MED ORDER — ROSUVASTATIN CALCIUM 20 MG PO TABS: 20.0000 mg | ORAL_TABLET | Freq: Every day | ORAL | 0 refills | Status: DC

## 2019-09-12 ENCOUNTER — Other Ambulatory Visit: Payer: Self-pay

## 2019-09-12 DIAGNOSIS — K219 Gastro-esophageal reflux disease without esophagitis: Secondary | ICD-10-CM

## 2019-09-12 MED ORDER — PANTOPRAZOLE SODIUM 40 MG PO TBEC: 40.0000 mg | DELAYED_RELEASE_TABLET | Freq: Every day | ORAL | 0 refills | Status: DC

## 2019-09-12 NOTE — Telephone Encounter (Signed)
Refill request for general medication. Pantoprazole to [place] office visit 06/24/2019   Follow up on 09/24/2019

## 2019-09-24 ENCOUNTER — Ambulatory Visit (INDEPENDENT_AMBULATORY_CARE_PROVIDER_SITE_OTHER): Payer: Medicare Other | Admitting: Family Medicine

## 2019-09-24 ENCOUNTER — Encounter: Payer: Self-pay | Admitting: Family Medicine

## 2019-09-24 ENCOUNTER — Other Ambulatory Visit: Payer: Self-pay

## 2019-09-24 VITALS — BP 130/76 | HR 102 | Temp 97.1°F | Resp 16 | Ht 59.0 in | Wt 145.2 lb

## 2019-09-24 DIAGNOSIS — E1129 Type 2 diabetes mellitus with other diabetic kidney complication: Secondary | ICD-10-CM | POA: Diagnosis not present

## 2019-09-24 DIAGNOSIS — F3110 Bipolar disorder, current episode manic without psychotic features, unspecified: Secondary | ICD-10-CM

## 2019-09-24 DIAGNOSIS — I10 Essential (primary) hypertension: Secondary | ICD-10-CM | POA: Diagnosis not present

## 2019-09-24 DIAGNOSIS — R809 Proteinuria, unspecified: Secondary | ICD-10-CM

## 2019-09-24 DIAGNOSIS — K219 Gastro-esophageal reflux disease without esophagitis: Secondary | ICD-10-CM

## 2019-09-24 DIAGNOSIS — Z23 Encounter for immunization: Secondary | ICD-10-CM

## 2019-09-24 DIAGNOSIS — H6992 Unspecified Eustachian tube disorder, left ear: Secondary | ICD-10-CM

## 2019-09-24 DIAGNOSIS — L84 Corns and callosities: Secondary | ICD-10-CM

## 2019-09-24 DIAGNOSIS — J441 Chronic obstructive pulmonary disease with (acute) exacerbation: Secondary | ICD-10-CM

## 2019-09-24 DIAGNOSIS — E032 Hypothyroidism due to medicaments and other exogenous substances: Secondary | ICD-10-CM

## 2019-09-24 MED ORDER — UMECLIDINIUM-VILANTEROL 62.5-25 MCG/INH IN AEPB: 1.0000 | INHALATION_SPRAY | Freq: Every day | RESPIRATORY_TRACT | 5 refills | Status: DC

## 2019-09-24 MED ORDER — LOSARTAN POTASSIUM 50 MG PO TABS: 50.0000 mg | ORAL_TABLET | Freq: Every day | ORAL | 0 refills | Status: DC

## 2019-09-24 MED ORDER — PREDNISONE 10 MG PO TABS: 10.0000 mg | ORAL_TABLET | Freq: Two times a day (BID) | ORAL | 0 refills | Status: DC

## 2019-09-24 MED ORDER — PANTOPRAZOLE SODIUM 40 MG PO TBEC: 40.0000 mg | DELAYED_RELEASE_TABLET | Freq: Every day | ORAL | 5 refills | Status: DC

## 2019-09-24 NOTE — Progress Notes (Signed)
Name: April Luna   MRN: [id number]    DOB: Sep 05, 1971   Date:09/24/2019       Progress Note  Subjective  Chief Complaint  Chief Complaint  Patient presents with  . Medication Refill    3 month F/U  . Hypertension    Feels like her BP drops when she stands up   . Constipation    Has been better with Amitiza  . COPD    Switched back to Anoro and trouble breathing still  . Hyperlipidemia    HPI  COPD: she was on Symbicort but stopped and switched back to Anoro because she felt like symbicort was no longer working, she has tried Theme park manager but it caused oral candidiasis.  She has chronic cough and sputum is white, and SOB, but states a little worse SOB  over the past month. She is still smoking, currently less than one pack per day, used to smoke close to 2 packs in the past   HTN: bp is at goal, but she has intermittent dizziness, states at home bp can drop down to 110, we will stop hctz, and lisinopril and try switching to losartan 50 mg , she will return for bp check only in a couple of weeks, I will send more refills if at goal, otherwise adjust dose as needed.   Hyperlipidemia: we switched from Pravachol to [facility] in April 2019 and LDL improved, however she states has difficulty taking medication at night and last LDL was up again, advised to take it in the mornings with other medications and recheck labs next visit    Recurrent OZD:GUYQIH Urologist, has a history of kidney stones, and had lithotripsy , taking ditropan because of urinary urgency and is doing better   Chronic constipation:she states she has been taking Amitiza once a day and is doing well. She has bowel movements daily, no straining , abdominal pain or blood in stools.   DMII: with dyslipidemia, on diet only, last A1C was controlled, she states mouth is always dry, but no polyphagia or polyuria. She is on statin therapy ( not very complaint), she has a history of microalbuminuria and CKI stage III  also has a callus formation on right first toe and has seen podiatrist in the past but would like to go somewhere else, Previously seen by Dr. Tonette Bihari   Bipolar disorder: she is seeing psychiatrist Dr. Holley Raring,   taking medications as prescribed , no recent admissions to psychiatric unit. Last admission was 2016 for a psychotic episode. No history of suicide attempts.   Hypothyroidism: she is taking levothyroxine and denies hair loss, she has chronic dry skin . Last TSH was at goal , continue current medication   Chronic right lower back pain: going on for years, she states got better when adjusted, seems to be right on top of sacro- iliac joint, she states occasionally shoots down to her right lower leg. Taking gabapentin but not helping. Advised Tylenol. Shooting pain is not constant, but lower back pain is aching and constant. And sometimes a sharp pain   Eustachian tube dysfunction: she has a nasal spray at home, but not using she states left ear feels full, advised to resume nasal spray    Patient Active Problem List   Diagnosis Date Noted  . Morbid obesity ([place]) 06/24/2019  . Type 2 diabetes mellitus with microalbuminuria, without long-term current use of insulin ([place]) 06/24/2019  . Chronic obstructive pulmonary disease ([place]) 06/24/2019  . Chronic constipation 06/24/2019  .  ASCUS of cervix with negative high risk HPV 09/05/2018  . GERD without esophagitis 04/11/2018  . Hyperlipidemia 04/11/2018  . Hypertension 04/11/2018  . Hypothyroidism 04/26/2015  . Bipolar I disorder, most recent episode (or current) manic ([place]) 04/25/2015  . Delirium due to another medical condition 04/25/2015  . Cannabis abuse 04/25/2015    Past Surgical History:  Procedure Laterality Date  . CESAREAN SECTION    . CYSTOSCOPY W/ RETROGRADES Left 11/08/2018   Procedure: CYSTOSCOPY WITH RETROGRADE PYELOGRAM;  Surgeon: Billey Co, MD;  Location: [facility] ORS;  Service: Urology;  Laterality: Left;  .  CYSTOSCOPY/URETEROSCOPY/HOLMIUM LASER/STENT PLACEMENT Left 11/08/2018   Procedure: CYSTOSCOPY/URETEROSCOPY/HOLMIUM LASER/STENT PLACEMENT;  Surgeon: Billey Co, MD;  Location: [facility] ORS;  Service: Urology;  Laterality: Left;  . MOUTH SURGERY     wisdom teeth extraction    Family History  Problem Relation Age of Onset  . Depression Mother   . Anxiety disorder Mother   . Diabetes Mother   . Hypertension Mother   . Hyperlipidemia Mother   . Cancer Mother   . Cancer Father        colon cancer  . Depression Brother   . Anxiety disorder Brother   . Diabetes Mellitus II Maternal Grandmother   . Hypercholesterolemia Maternal Grandmother   . Cancer Paternal Grandmother   . Diabetes Paternal Grandmother     Social History   Socioeconomic History  . Marital status: Married    Spouse name: Montine Circle  . Number of children: 1  . Years of education: 4  . Highest education level: High school graduate  Occupational History  . Occupation: unemployed    Comment: disabled  Social Needs  . Financial resource strain: Somewhat hard  . Food insecurity    Worry: Sometimes true    Inability: Never true  . Transportation needs    Medical: Yes    Non-medical: Yes  Tobacco Use  . Smoking status: Current Every Day Smoker    Packs/day: 1.00    Years: 34.00    Pack years: 34.00    Types: Cigarettes    Start date: 05/13/1985  . Smokeless tobacco: Never Used  Substance and Sexual Activity  . Alcohol use: Not Currently    Alcohol/week: 0.0 standard drinks    Comment: rarely  . Drug use: Not Currently    Types: Marijuana    Comment: none since August 2019  . Sexual activity: Not Currently    Birth control/protection: Post-menopausal  Lifestyle  . Physical activity    Days per week: 0 days    Minutes per session: 0 min  . Stress: To some extent  Relationships  . Social Herbalist on phone: Three times a week    Gets together: Once a week    Attends religious  service: Never    Active member of club or organization: No    Attends meetings of clubs or organizations: Never    Relationship status: Married  . Intimate partner violence    Fear of current or ex partner: No    Emotionally abused: No    Physically abused: No    Forced sexual activity: No  Other Topics Concern  . Not on file  Social History Narrative  . Not on file     Current Outpatient Medications:  .  albuterol (PROVENTIL) (2.5 MG/3ML) 0.083% nebulizer solution, Take 3 mLs (2.5 mg total) by nebulization every 6 (six) hours as needed for wheezing or shortness of breath.,  Disp: 150 mL, Rfl: 1 .  amantadine (SYMMETREL) 100 MG capsule, Take 100 mg by mouth 2 (two) times daily., Disp: , Rfl:  .  blood glucose meter kit and supplies, Dispense based on patient and insurance preference. Use up to four times daily as directed. (FOR ICD-10 E10.9, E11.9)., Disp: 1 each, Rfl: 0 .  buPROPion (WELLBUTRIN XL) 150 MG 24 hr tablet, Take 150 mg by mouth daily., Disp: , Rfl:  .  Cholecalciferol (VITAMIN D-3) 125 MCG (5000 UT) TABS, Take 5,000 Units by mouth daily., Disp: 30 tablet, Rfl: 1 .  gabapentin (NEURONTIN) 300 MG capsule, Take 300 mg by mouth 2 (two) times daily., Disp: , Rfl:  .  hydrOXYzine (VISTARIL) 50 MG capsule, Take 50 mg by mouth 2 (two) times daily as needed for anxiety., Disp: , Rfl:  .  levothyroxine (SYNTHROID) 75 MCG tablet, Take 1 tablet (75 mcg total) by mouth daily at 6 (six) AM., Disp: 30 tablet, Rfl: 0 .  lubiprostone (AMITIZA) 24 MCG capsule, Take 1 capsule (24 mcg total) by mouth daily with breakfast., Disp: 30 capsule, Rfl: 5 .  oxybutynin (DITROPAN-XL) 10 MG 24 hr tablet, Take 1 tablet (10 mg total) by mouth daily., Disp: 90 tablet, Rfl: 3 .  Paliperidone Palmitate ER (INVEGA TRINZA) 819 MG/2.625ML SUSY, Inject 819 mg into the muscle every 3 (three) months., Disp: , Rfl:  .  pantoprazole (PROTONIX) 40 MG tablet, Take 1 tablet (40 mg total) by mouth daily at 6 (six) AM.,  Disp: 30 tablet, Rfl: 0 .  prazosin (MINIPRESS) 2 MG capsule, Take 2 mg by mouth at bedtime., Disp: , Rfl:  .  rosuvastatin (CRESTOR) 20 MG tablet, Take 1 tablet (20 mg total) by mouth at bedtime. In place of pravastatin, Disp: 90 tablet, Rfl: 0 .  sertraline (ZOLOFT) 100 MG tablet, Take 200 mg by mouth daily., Disp: , Rfl:   Allergies  Allergen Reactions  . Metformin And Related Nausea And Vomiting  . Penicillins Other (See Comments)    GI distress Has patient had a PCN reaction causing immediate rash, facial/tongue/throat swelling, SOB or lightheadedness with hypotension: No Has patient had a PCN reaction causing severe rash involving mucus membranes or skin necrosis: No Has patient had a PCN reaction that required hospitalization: No Has patient had a PCN reaction occurring within the last 10 years: No If all of the above answers are "NO", then may proceed with Cephalosporin use.   Marland Kitchen Perphenazine Other (See Comments)    Tremors, muscle weakness, tongue swelling  . Sulfa Antibiotics Other (See Comments)    GI distress   . Abilify [Aripiprazole] Rash    I personally reviewed active problem list, medication list, allergies, family history, social history, health maintenance with the patient/caregiver today.   ROS  Constitutional: Negative for fever or weight change.  Respiratory: positive  for cough and shortness of breath.   Cardiovascular: Negative for chest pain or palpitations.  Gastrointestinal: Negative for abdominal pain, no bowel changes.  Musculoskeletal: Negative for gait problem or joint swelling.  Skin: Negative for rash.  Neurological: Positive  For intermittent  dizziness but no  headache.  No other specific complaints in a complete review of systems (except as listed in HPI above).   Objective  Vitals:   09/24/19 1336  BP: 130/76  Pulse: (!) 102  Resp: 16  Temp: (!) 97.1 F (36.2 C)  TempSrc: Temporal  SpO2: 99%  Weight: 145 lb 3.2 oz (65.9 kg)   Height: _0  (  1.499 m)    Body mass index is 29.33 kg/m.  Physical Exam  Constitutional: Patient appears well-developed and well-nourished.  No distress.  HEENT: head atraumatic, normocephalic, pupils equal and reactive to light Cardiovascular: Normal rate, regular rhythm and normal heart sounds.  No murmur heard. No BLE edema. Pulmonary/Chest: Effort normal , in and expiratory wheezing, and bilateral rhonchi  Abdominal: Soft.  There is no tenderness. Psychiatric: Patient has a normal mood and affect. behavior is normal. Judgment and thought content normal.Foot: callus formation on base of right first toe   Recent Results (from the past 2160 hour(s))  Urinalysis, Complete     Status: Abnormal   Collection Time: 07/10/19  2:37 PM  Result Value Ref Range   Specific Gravity, UA 1.015 1.005 - 1.030   pH, UA 6.0 5.0 - 7.5   Color, UA Yellow Yellow   Appearance Ur Hazy (A) Clear   Leukocytes,UA Negative Negative   Protein,UA Negative Negative/Trace   Glucose, UA Negative Negative   Ketones, UA Negative Negative   RBC, UA 1+ (A) Negative   Bilirubin, UA Negative Negative   Urobilinogen, Ur 0.2 0.2 - 1.0 mg/dL   Nitrite, UA Negative Negative   Microscopic Examination See below:   Microscopic Examination     Status: Abnormal   Collection Time: 07/10/19  2:37 PM   URINE  Result Value Ref Range   WBC, UA 0-5 0 - 5 /hpf   RBC 0-2 0 - 2 /hpf   Epithelial Cells (non renal) 0-10 0 - 10 /hpf   Casts Present (A) None seen /lpf   Cast Type Hyaline casts N/A   Bacteria, UA Few None seen/Few  Urine culture     Status: None   Collection Time: 07/10/19  3:26 PM   Specimen: Urine   UR  Result Value Ref Range   Urine Culture, Routine Final report    Organism ID, Bacteria Comment     Comment: Culture shows less than 10,000 colony forming units of bacteria per milliliter of urine. This colony count is not generally considered to be clinically significant.       PHQ2/9: Depression  screen [facility] 2/9 09/24/2019 06/24/2019 02/04/2019 01/28/2019 01/10/2019  Decreased Interest _0 Down, Depressed, Hopeless 0 _1 PHQ - 2 Score _2 Altered sleeping 0 0 0 2 3  Tired, decreased energy _3 Change in appetite 1 0 _4 Feeling bad or failure about yourself  0 0 _5 Trouble concentrating _6 0 0  Moving slowly or fidgety/restless 0 0 2 0 0  Suicidal thoughts 0 0 0 0 0  PHQ-9 Score _7 Difficult doing work/chores _8     phq 9 is positive   Fall Risk: Fall Risk  09/24/2019 06/24/2019 02/04/2019 01/28/2019 01/02/2019  Falls in the past year? 0 0 - 0 0  Number falls in past yr: 0 0 0 0 0  Injury with Fall? 0 0 0 0 0  Follow up - Falls evaluation completed - Falls evaluation completed -    Functional Status Survey: Is the patient deaf or have difficulty hearing?: No Does the patient have difficulty seeing, even when wearing glasses/contacts?: No Does the patient have difficulty concentrating, remembering, or making decisions?: No Does the patient have difficulty walking or climbing stairs?: No  Does the patient have difficulty dressing or bathing?: No Does the patient have difficulty doing errands alone such as visiting a doctor's office or shopping?: No    Assessment & Plan  1. Type 2 diabetes mellitus with microalbuminuria, without long-term current use of insulin (HCC)  - losartan (COZAAR) 50 MG tablet; Take 1 tablet (50 mg total) by mouth daily. In place of hczt and lisinopril  Dispense: 30 tablet; Refill: 0 - Ambulatory referral to Podiatry  2. GERD without esophagitis  - pantoprazole (PROTONIX) 40 MG tablet; Take 1 tablet (40 mg total) by mouth daily at 6 (six) AM.  Dispense: 30 tablet; Refill: 5  3. Bipolar I disorder, most recent episode (or current) manic ([place])  Keep follow up with Dr. Holley Raring, explained risk of going manic   4. Need for  immunization against influenza  - Flu Vaccine QUAD 36+ mos IM  5. Essential hypertension  - losartan (COZAAR) 50 MG tablet; Take 1 tablet (50 mg total) by mouth daily. In place of hczt and lisinopril  Dispense: 30 tablet; Refill: 0  6. COPD with acute exacerbation ([place])  - umeclidinium-vilanterol (ANORO ELLIPTA) 62.5-25 MCG/INH AEPB; Inhale 1 puff into the lungs daily.  Dispense: 60 each; Refill: 5 - Ambulatory referral to Pulmonology - predniSONE (DELTASONE) 10 MG tablet; Take 1 tablet (10 mg total) by mouth 2 (two) times daily with a meal.  Dispense: 10 tablet; Refill: 0  7. Hypothyroidism due to medication   8. Eustachian tube disorder, left   9. Callus of foot  - Ambulatory referral to Podiatry

## 2019-10-03 ENCOUNTER — Other Ambulatory Visit: Payer: Self-pay

## 2019-10-03 NOTE — Telephone Encounter (Signed)
Refill request for general medication. Vitamin D   Last office visit 09/24/2019   Follow up on 10/22/2019

## 2019-10-06 ENCOUNTER — Other Ambulatory Visit: Payer: Self-pay

## 2019-10-06 ENCOUNTER — Ambulatory Visit (INDEPENDENT_AMBULATORY_CARE_PROVIDER_SITE_OTHER): Payer: Medicare Other | Admitting: Podiatry

## 2019-10-06 ENCOUNTER — Encounter: Payer: Self-pay | Admitting: Podiatry

## 2019-10-06 DIAGNOSIS — M205X1 Other deformities of toe(s) (acquired), right foot: Secondary | ICD-10-CM

## 2019-10-06 DIAGNOSIS — L84 Corns and callosities: Secondary | ICD-10-CM | POA: Diagnosis not present

## 2019-10-06 DIAGNOSIS — M205X2 Other deformities of toe(s) (acquired), left foot: Secondary | ICD-10-CM

## 2019-10-06 NOTE — Progress Notes (Signed)
This patient presents to the office with chief complaint of callus both big toes  and diabetic feet.  This patient  says there  is   pain and discomfort in her  feet.  This patient says she has been trimming her callus both big toes at home..  .  Patient has no history of infection or drainage from both feet.   . This patient presents  to the office today for treatment of her callus  and a foot evaluation due to history of  diabetes.  General Appearance  Alert, conversant and in no acute stress.  Vascular  Dorsalis pedis and posterior tibial  pulses are palpable  bilaterally.  Capillary return is within normal limits  bilaterally. Temperature is within normal limits  bilaterally.  Neurologic  Senn-Weinstein monofilament wire test diminished  bilaterally. Muscle power 2/4  bilaterally.  Nails Normal nails with no evidence of infection or drainage.  Orthopedic  No limitations of motion of motion feet .  No crepitus or effusions noted.  No bony pathology or digital deformities noted.HAV/Hallux limitus 1st MPJ right greater than left.  Ski slope deformity hallux  B/L.  Skin  Callus  Sub IPJ  B/L.  No signs of infections or ulcers noted.     Plantar fallus hallux  B/L.  Diabetes with no foot complications  IE  Debride callus.    A diabetic foot exam was performed and there is no evidence of any vascular or neurologic pathology.  Discussed this condition with this patient.  Told her the calluses form due to the end result of a hallux limitus first MPJ bilaterally.  We should consider kinetic wedge orthoses in the future prior to surgical correction.   RTC prn   Gardiner Barefoot DPM

## 2019-10-10 ENCOUNTER — Other Ambulatory Visit: Payer: Self-pay

## 2019-10-10 DIAGNOSIS — E032 Hypothyroidism due to medicaments and other exogenous substances: Secondary | ICD-10-CM

## 2019-10-10 MED ORDER — LEVOTHYROXINE SODIUM 75 MCG PO TABS: 75.0000 ug | ORAL_TABLET | Freq: Every day | ORAL | 0 refills | Status: DC

## 2019-10-10 NOTE — Telephone Encounter (Signed)
Refill request for thyroid medication. Levothyroxine to [place]    Last visit:  09/24/2019   Lab Results  Component Value Date   TSH 2.66 06/24/2019     Follow up on 10/22/2019

## 2019-10-14 ENCOUNTER — Other Ambulatory Visit: Payer: Self-pay

## 2019-10-14 DIAGNOSIS — E1129 Type 2 diabetes mellitus with other diabetic kidney complication: Secondary | ICD-10-CM

## 2019-10-14 DIAGNOSIS — R809 Proteinuria, unspecified: Secondary | ICD-10-CM

## 2019-10-14 DIAGNOSIS — I10 Essential (primary) hypertension: Secondary | ICD-10-CM

## 2019-10-14 MED ORDER — LOSARTAN POTASSIUM 50 MG PO TABS: 50.0000 mg | ORAL_TABLET | Freq: Every day | ORAL | 0 refills | Status: DC

## 2019-10-14 MED ORDER — VITAMIN D-3 125 MCG (5000 UT) PO TABS: 5000.0000 [IU] | ORAL_TABLET | Freq: Every day | ORAL | 1 refills | Status: AC

## 2019-10-14 NOTE — Telephone Encounter (Signed)
Refill request for general medication. Losartan and Vitamin D   Last office visit 09/24/2019   Follow up on 10/22/2019

## 2019-10-22 ENCOUNTER — Ambulatory Visit: Payer: Medicare Other | Admitting: Family Medicine

## 2019-10-30 ENCOUNTER — Other Ambulatory Visit: Payer: Self-pay | Admitting: Family Medicine

## 2019-10-30 DIAGNOSIS — E1129 Type 2 diabetes mellitus with other diabetic kidney complication: Secondary | ICD-10-CM

## 2019-10-30 DIAGNOSIS — I10 Essential (primary) hypertension: Secondary | ICD-10-CM

## 2019-11-10 ENCOUNTER — Other Ambulatory Visit: Payer: Self-pay | Admitting: Family Medicine

## 2019-11-10 DIAGNOSIS — E032 Hypothyroidism due to medicaments and other exogenous substances: Secondary | ICD-10-CM

## 2019-11-26 ENCOUNTER — Other Ambulatory Visit: Payer: Self-pay | Admitting: Family Medicine

## 2019-11-26 DIAGNOSIS — I10 Essential (primary) hypertension: Secondary | ICD-10-CM

## 2019-11-26 DIAGNOSIS — E032 Hypothyroidism due to medicaments and other exogenous substances: Secondary | ICD-10-CM

## 2019-11-26 DIAGNOSIS — R809 Proteinuria, unspecified: Secondary | ICD-10-CM

## 2019-12-08 ENCOUNTER — Ambulatory Visit: Payer: Medicare Other | Admitting: Pulmonary Disease

## 2019-12-08 ENCOUNTER — Encounter: Payer: Self-pay | Admitting: Pulmonary Disease

## 2019-12-08 ENCOUNTER — Other Ambulatory Visit: Payer: Self-pay

## 2019-12-08 DIAGNOSIS — F17210 Nicotine dependence, cigarettes, uncomplicated: Secondary | ICD-10-CM

## 2019-12-08 DIAGNOSIS — J449 Chronic obstructive pulmonary disease, unspecified: Secondary | ICD-10-CM

## 2019-12-08 MED ORDER — TRELEGY ELLIPTA 100-62.5-25 MCG/INH IN AEPB: 1.0000 | INHALATION_SPRAY | Freq: Every day | RESPIRATORY_TRACT | 6 refills | Status: DC

## 2019-12-08 MED ORDER — NICOTINE 10 MG IN INHA: 1.0000 | RESPIRATORY_TRACT | 3 refills | Status: DC | PRN

## 2019-12-08 MED ORDER — TRELEGY ELLIPTA 100-62.5-25 MCG/INH IN AEPB: 1.0000 | INHALATION_SPRAY | Freq: Every day | RESPIRATORY_TRACT | 0 refills | Status: AC

## 2019-12-08 NOTE — Progress Notes (Signed)
 Assessment & Plan:  1. Chronic obstructive pulmonary disease, unspecified COPD type (HCC) (Primary)  2. Tobacco dependence due to cigarettes   Patient Instructions  1.  We will switch you to Trelegy Ellipta  1 inhalation daily, make sure you rinse your mouth well after use.  2.  We will get breathing tests.  3.  We will see you in follow-up in 3 months time call sooner should you have any new difficulties  4.  We have sent a prescription for Nicotrol  inhaler to your pharmacy.  Please note: late entry documentation due to logistical difficulties during COVID-19 pandemic. This note is filed for information purposes only, and is not intended to be used for billing, nor does it represent the full scope/nature of the visit in question. Please see any associated scanned media linked to date of encounter for additional pertinent information.  Subjective:    HPI: April Luna is a 48 y.o. female presenting to the pulmonology clinic on 12/08/2019 with report of: PULMONARY CONSULT (per Dr. Glenard- copd dx 2016. c/o sob with exertion and at rest, prod cough with white to light yellow mucus and wheezing)     Outpatient Encounter Medications as of 12/08/2019  Medication Sig   amantadine  (SYMMETREL ) 100 MG capsule Take 100 mg by mouth 2 (two) times daily. (Patient not taking: Reported on 09/24/2024)   Cholecalciferol  (VITAMIN D -3) 125 MCG (5000 UT) TABS Take 5,000 Units by mouth daily. (Patient not taking: Reported on 09/24/2024)   gabapentin  (NEURONTIN ) 300 MG capsule Take 300 mg by mouth 2 (two) times daily. (Patient not taking: Reported on 09/24/2024)   sertraline  (ZOLOFT ) 100 MG tablet Take 200 mg by mouth every morning. (Patient not taking: Reported on 09/24/2024)   [DISCONTINUED] albuterol  (PROVENTIL ) (2.5 MG/3ML) 0.083% nebulizer solution Take 3 mLs (2.5 mg total) by nebulization every 6 (six) hours as needed for wheezing or shortness of breath.   [DISCONTINUED] blood glucose meter  kit and supplies Dispense based on patient and insurance preference. Use up to four times daily as directed. (FOR ICD-10 E10.9, E11.9).   [DISCONTINUED] buPROPion  (WELLBUTRIN  XL) 150 MG 24 hr tablet Take 150 mg by mouth every morning. (Patient not taking: Reported on 09/20/2022)   [DISCONTINUED] hydrOXYzine  (VISTARIL ) 50 MG capsule Take 50 mg by mouth 3 (three) times daily as needed for anxiety. (Patient not taking: Reported on 06/20/2023)   [DISCONTINUED] levothyroxine  (SYNTHROID ) 75 MCG tablet TAKE 1 TABLET BY MOUTH DAILY AT 6 AM   [DISCONTINUED] losartan  (COZAAR ) 50 MG tablet TAKE 1 TABLET BY MOUTH DAILY. IN PLACE OF HCTZ AND LISINOPRIL    [DISCONTINUED] lubiprostone  (AMITIZA ) 24 MCG capsule Take 1 capsule (24 mcg total) by mouth daily with breakfast.   [DISCONTINUED] oxybutynin  (DITROPAN -XL) 10 MG 24 hr tablet Take 1 tablet (10 mg total) by mouth daily.   [DISCONTINUED] Paliperidone  Palmitate ER (INVEGA  TRINZA) 819 MG/2.625ML SUSY Inject 819 mg into the muscle every 3 (three) months.   [DISCONTINUED] pantoprazole  (PROTONIX ) 40 MG tablet Take 1 tablet (40 mg total) by mouth daily at 6 (six) AM.   [DISCONTINUED] prazosin (MINIPRESS) 2 MG capsule Take 2 mg by mouth at bedtime.   [DISCONTINUED] predniSONE  (DELTASONE ) 10 MG tablet Take 1 tablet (10 mg total) by mouth 2 (two) times daily with a meal.   [DISCONTINUED] rosuvastatin  (CRESTOR ) 20 MG tablet TAKE 1 TABLET BY MOUTH AT BEDTIME. IN PLACE OF PRAVASTATIN    [DISCONTINUED] umeclidinium-vilanterol (ANORO ELLIPTA ) 62.5-25 MCG/INH AEPB Inhale 1 puff into the lungs daily.   [EXPIRED]  Fluticasone -Umeclidin-Vilant (TRELEGY ELLIPTA ) 100-62.5-25 MCG/INH AEPB Inhale 1 puff into the lungs daily for 1 day.   [DISCONTINUED] Fluticasone -Umeclidin-Vilant (TRELEGY ELLIPTA ) 100-62.5-25 MCG/INH AEPB Inhale 1 puff into the lungs daily.   [DISCONTINUED] nicotine  (NICOTROL ) 10 MG inhaler Inhale 1 Cartridge (1 continuous puffing total) into the lungs as needed for smoking  cessation.   No facility-administered encounter medications on file as of 12/08/2019.      Objective:   There were no vitals filed for this visit.   Physical exam documentation is limited by delayed entry of information.

## 2019-12-08 NOTE — Patient Instructions (Signed)
1.  We will switch you to Trelegy Ellipta 1 inhalation daily, make sure you rinse your mouth well after use.  2.  We will get breathing tests.  3.  We will see you in follow-up in 3 months time call sooner should you have any new difficulties  4.  We have sent a prescription for Nicotrol inhaler to your pharmacy.

## 2019-12-30 ENCOUNTER — Other Ambulatory Visit: Payer: Self-pay | Admitting: Urology

## 2020-01-06 ENCOUNTER — Other Ambulatory Visit: Payer: Self-pay | Admitting: Family Medicine

## 2020-01-06 DIAGNOSIS — R809 Proteinuria, unspecified: Secondary | ICD-10-CM

## 2020-01-06 DIAGNOSIS — E1129 Type 2 diabetes mellitus with other diabetic kidney complication: Secondary | ICD-10-CM

## 2020-01-06 DIAGNOSIS — I10 Essential (primary) hypertension: Secondary | ICD-10-CM

## 2020-01-06 DIAGNOSIS — E032 Hypothyroidism due to medicaments and other exogenous substances: Secondary | ICD-10-CM

## 2020-01-06 NOTE — Telephone Encounter (Signed)
Requested Prescriptions  Pending Prescriptions Disp Refills  . losartan (COZAAR) 50 MG tablet [Pharmacy Med Name: LOSARTAN POTASSIUM 50 MG TAB] 90 tablet 0    Sig: TAKE 1 TABLET BY MOUTH DAILY. IN PLACE OF HCTZ AND LISINOPRIL     Cardiovascular:  Angiotensin Receptor Blockers Failed - 01/06/2020 12:44 PM      Failed - Cr in normal range and within 180 days    Creat  Date Value Ref Range Status  06/24/2019 1.28 (H) 0.50 - 1.10 mg/dL Final   Creatinine, Urine  Date Value Ref Range Status  01/28/2019 56 20 - 275 mg/dL Final         Failed - K in normal range and within 180 days    Potassium  Date Value Ref Range Status  06/24/2019 4.0 3.5 - 5.3 mmol/L Final  04/20/2015 3.9 mmol/L Final    Comment:    3.5-5.1 NOTE: New Reference Range  03/02/15          Passed - Patient is not pregnant      Passed - Last BP in normal range    BP Readings from Last 1 Encounters:  10/06/19 133/84         Passed - Valid encounter within last 6 months    Recent Outpatient Visits          3 months ago Type 2 diabetes mellitus with microalbuminuria, without long-term current use of insulin [facility])   [facility] [place], Drue Stager, MD   6 months ago Type 2 diabetes mellitus with microalbuminuria, without long-term current use of insulin [facility])   [place], Astrid Divine, FNP   11 months ago Type 2 diabetes mellitus with microalbuminuria, without long-term current use of insulin [facility])   [place], NP   11 months ago Proteinuria, unspecified type   [place], NP   12 months ago Oral thrush   [place], NP      Future Appointments            In 6 months [facility] [place], Herbert Seta, MD [place]

## 2020-02-03 ENCOUNTER — Other Ambulatory Visit: Payer: Self-pay | Admitting: Family Medicine

## 2020-02-03 DIAGNOSIS — E032 Hypothyroidism due to medicaments and other exogenous substances: Secondary | ICD-10-CM

## 2020-02-04 ENCOUNTER — Emergency Department: Payer: Medicare Other

## 2020-02-04 ENCOUNTER — Emergency Department
Admission: EM | Admit: 2020-02-04 | Discharge: 2020-02-04 | Disposition: A | Discharge status: Home / Self Care | Payer: Medicare Other | Attending: Emergency Medicine | Admitting: Emergency Medicine

## 2020-02-04 ENCOUNTER — Ambulatory Visit: Payer: Self-pay

## 2020-02-04 ENCOUNTER — Other Ambulatory Visit: Payer: Self-pay

## 2020-02-04 DIAGNOSIS — J449 Chronic obstructive pulmonary disease, unspecified: Secondary | ICD-10-CM | POA: Diagnosis not present

## 2020-02-04 DIAGNOSIS — K2900 Acute gastritis without bleeding: Secondary | ICD-10-CM | POA: Insufficient documentation

## 2020-02-04 DIAGNOSIS — R1012 Left upper quadrant pain: Secondary | ICD-10-CM | POA: Diagnosis not present

## 2020-02-04 DIAGNOSIS — F17210 Nicotine dependence, cigarettes, uncomplicated: Secondary | ICD-10-CM | POA: Diagnosis not present

## 2020-02-04 DIAGNOSIS — E119 Type 2 diabetes mellitus without complications: Secondary | ICD-10-CM | POA: Diagnosis not present

## 2020-02-04 DIAGNOSIS — E039 Hypothyroidism, unspecified: Secondary | ICD-10-CM | POA: Diagnosis not present

## 2020-02-04 DIAGNOSIS — I10 Essential (primary) hypertension: Secondary | ICD-10-CM | POA: Diagnosis not present

## 2020-02-04 DIAGNOSIS — Z79899 Other long term (current) drug therapy: Secondary | ICD-10-CM | POA: Diagnosis not present

## 2020-02-04 DIAGNOSIS — R918 Other nonspecific abnormal finding of lung field: Secondary | ICD-10-CM | POA: Diagnosis not present

## 2020-02-04 LAB — CBC
HCT: 39.9 % (ref 36.0–46.0)
Hemoglobin: 13.6 g/dL (ref 12.0–15.0)
MCH: 31.1 pg (ref 26.0–34.0)
MCHC: 34.1 g/dL (ref 30.0–36.0)
MCV: 91.1 fL (ref 80.0–100.0)
Platelets: 159 10*3/uL (ref 150–400)
RBC: 4.38 MIL/uL (ref 3.87–5.11)
RDW: 13.2 % (ref 11.5–15.5)
WBC: 10 10*3/uL (ref 4.0–10.5)
nRBC: 0 % (ref 0.0–0.2)

## 2020-02-04 LAB — HEPATIC FUNCTION PANEL
ALT: 14 U/L (ref 0–44)
AST: 14 U/L — ABNORMAL LOW (ref 15–41)
Albumin: 3.7 g/dL (ref 3.5–5.0)
Alkaline Phosphatase: 94 U/L (ref 38–126)
Bilirubin, Direct: <0.1 mg/dL (ref 0.0–0.2)
Total Bilirubin: 0.6 mg/dL (ref 0.3–1.2)
Total Protein: 6.8 g/dL (ref 6.5–8.1)

## 2020-02-04 LAB — BASIC METABOLIC PANEL
Anion gap: 9 (ref 5–15)
BUN: 12 mg/dL (ref 6–20)
CO2: 25 mmol/L (ref 22–32)
Calcium: 9.3 mg/dL (ref 8.9–10.3)
Chloride: 102 mmol/L (ref 98–111)
Creatinine, Ser: 1.03 mg/dL — ABNORMAL HIGH (ref 0.44–1.00)
GFR calc Af Amer: >60 mL/min (ref >60)
GFR calc non Af Amer: >60 mL/min (ref >60)
Glucose, Bld: 105 mg/dL — ABNORMAL HIGH (ref 70–99)
Potassium: 4.1 mmol/L (ref 3.5–5.1)
Sodium: 136 mmol/L (ref 135–145)

## 2020-02-04 LAB — URINALYSIS, COMPLETE (UACMP) WITH MICROSCOPIC
Bacteria, UA: NONE SEEN
Bilirubin Urine: NEGATIVE
Glucose, UA: NEGATIVE mg/dL
Ketones, ur: NEGATIVE mg/dL
Leukocytes,Ua: NEGATIVE
Nitrite: NEGATIVE
Protein, ur: NEGATIVE mg/dL
Specific Gravity, Urine: 1.01 (ref 1.005–1.030)
pH: 6 (ref 5.0–8.0)

## 2020-02-04 LAB — POCT PREGNANCY, URINE: Preg Test, Ur: NEGATIVE

## 2020-02-04 LAB — LIPASE, BLOOD: Lipase: 40 U/L (ref 11–51)

## 2020-02-04 LAB — TROPONIN I (HIGH SENSITIVITY): Troponin I (High Sensitivity): <2 ng/L

## 2020-02-04 MED ORDER — SODIUM CHLORIDE 0.9% FLUSH: 3.0000 mL | Freq: Once | INTRAVENOUS | Status: DC

## 2020-02-04 NOTE — ED Provider Notes (Signed)
[facility] Emergency Department Provider Note   ____________________________________________   First MD Initiated Contact with Patient 02/04/20 1548     (approximate)  I have reviewed the triage vital signs and the nursing notes.   HISTORY  Chief Complaint Abdominal Pain   HPI April Luna is a 49 y.o. female with possible history of bipolar disorder, hypertension, hyperlipidemia, diabetes, COPD, hypothyroidism, and GERD who presents to the ED complaining of abdominal pain.  Patient reports she has had intermittent pain in her left upper quadrant for about the past year.  She describes it as sharp and exacerbated by deep breath when it is present.  She has also felt nauseous at times with one episode of vomiting a couple of days ago.  It has seemed to be worse when she eats certain foods and she is concerned that it might be related to her gallbladder.  She currently denies any fevers, cough, chest pain, or shortness of breath.  She denies any pain or nausea currently, but when she spoke with her PCPs office, they referred her to the ED for further evaluation.        Past Medical History:  Diagnosis Date  . Allergy    pollen  . Anxiety   . Bipolar 1 disorder ([place])   . COPD (chronic obstructive pulmonary disease) ([place])   . Depression   . Family history of adverse reaction to anesthesia    grand father had a stroke during anesthesia  . GERD (gastroesophageal reflux disease)   . History of kidney stones   . Hyperlipidemia   . Hypertension   . Hypothyroidism   . Pneumonia   . Type 2 diabetes mellitus with microalbuminuria, without long-term current use of insulin ([place]) 06/24/2019    Patient Active Problem List   Diagnosis Date Noted  . Plantar callus 10/06/2019  . Acquired hallux limitus of both feet 10/06/2019  . Morbid obesity ([place]) 06/24/2019  . Type 2 diabetes mellitus with microalbuminuria, without long-term current use of insulin ([place])  06/24/2019  . Chronic obstructive pulmonary disease ([place]) 06/24/2019  . Chronic constipation 06/24/2019  . ASCUS of cervix with negative high risk HPV 09/05/2018  . GERD without esophagitis 04/11/2018  . Hyperlipidemia 04/11/2018  . Hypertension 04/11/2018  . Hypothyroidism 04/26/2015  . Bipolar I disorder, most recent episode (or current) manic ([place]) 04/25/2015  . Delirium due to another medical condition 04/25/2015  . Cannabis abuse 04/25/2015    Past Surgical History:  Procedure Laterality Date  . CESAREAN SECTION    . CYSTOSCOPY W/ RETROGRADES Left 11/08/2018   Procedure: CYSTOSCOPY WITH RETROGRADE PYELOGRAM;  Surgeon: Billey Co, MD;  Location: [facility] ORS;  Service: Urology;  Laterality: Left;  . CYSTOSCOPY/URETEROSCOPY/HOLMIUM LASER/STENT PLACEMENT Left 11/08/2018   Procedure: CYSTOSCOPY/URETEROSCOPY/HOLMIUM LASER/STENT PLACEMENT;  Surgeon: Billey Co, MD;  Location: [facility] ORS;  Service: Urology;  Laterality: Left;  . MOUTH SURGERY     wisdom teeth extraction    Prior to Admission medications   Medication Sig Start Date End Date Taking? Authorizing Provider  albuterol (PROVENTIL) (2.5 MG/3ML) 0.083% nebulizer solution Take 3 mLs (2.5 mg total) by nebulization every 6 (six) hours as needed for wheezing or shortness of breath. 02/04/19   Poulose, Bethel Born, NP  amantadine (SYMMETREL) 100 MG capsule Take 100 mg by mouth 2 (two) times daily. 03/19/18   Sharmaine Base, MD  blood glucose meter kit and supplies Dispense based on patient and insurance preference. Use up to four times daily  as directed. (FOR ICD-10 E10.9, E11.9). 06/24/19   Hubbard Hartshorn, FNP  buPROPion (WELLBUTRIN XL) 150 MG 24 hr tablet Take 150 mg by mouth daily. 04/09/18   Sharmaine Base, MD  Cholecalciferol (VITAMIN D-3) 125 MCG (5000 UT) TABS Take 5,000 Units by mouth daily. 10/14/19   Steele Sizer, MD  Fluticasone-Umeclidin-Vilant (TRELEGY ELLIPTA) 100-62.5-25 MCG/INH AEPB Inhale 1 puff into the  lungs daily. 12/08/19   Tyler Pita, MD  gabapentin (NEURONTIN) 300 MG capsule Take 300 mg by mouth 2 (two) times daily.    Sharmaine Base, MD  hydrOXYzine (VISTARIL) 50 MG capsule Take 50 mg by mouth 2 (two) times daily as needed for anxiety.    Sharmaine Base, MD  levothyroxine (SYNTHROID) 75 MCG tablet TAKE 1 TABLET BY MOUTH DAILY AT 6 AM 02/03/20   Steele Sizer, MD  losartan (COZAAR) 50 MG tablet TAKE 1 TABLET BY MOUTH DAILY. IN PLACE OF HCTZ AND LISINOPRIL 01/06/20   Steele Sizer, MD  lubiprostone (AMITIZA) 24 MCG capsule Take 1 capsule (24 mcg total) by mouth daily with breakfast. 06/24/19   Hubbard Hartshorn, FNP  nicotine (NICOTROL) 10 MG inhaler Inhale 1 Cartridge (1 continuous puffing total) into the lungs as needed for smoking cessation. 12/08/19   Tyler Pita, MD  oxybutynin (DITROPAN-XL) 10 MG 24 hr tablet TAKE 1 TABLET BY MOUTH EACH DAY 12/30/19   Billey Co, MD  Paliperidone Palmitate ER (INVEGA TRINZA) 819 MG/2.625ML SUSY Inject 819 mg into the muscle every 3 (three) months.    Sharmaine Base, MD  pantoprazole (PROTONIX) 40 MG tablet Take 1 tablet (40 mg total) by mouth daily at 6 (six) AM. 09/24/19   Ancil Boozer, Drue Stager, MD  prazosin (MINIPRESS) 2 MG capsule Take 2 mg by mouth at bedtime.    Sharmaine Base, MD  rosuvastatin (CRESTOR) 20 MG tablet TAKE 1 TABLET BY MOUTH AT BEDTIME. IN PLACE OF PRAVASTATIN 11/26/19   Steele Sizer, MD  sertraline (ZOLOFT) 100 MG tablet Take 200 mg by mouth daily.    Sharmaine Base, MD    Allergies Metformin and related, Penicillins, Perphenazine, Sulfa antibiotics, and Abilify [aripiprazole]  Family History  Problem Relation Age of Onset  . Depression Mother   . Anxiety disorder Mother   . Diabetes Mother   . Hypertension Mother   . Hyperlipidemia Mother   . Cancer Mother   . Cancer Father        colon cancer  . Depression Brother   . Anxiety disorder Brother   . Diabetes Mellitus II Maternal Grandmother   .  Hypercholesterolemia Maternal Grandmother   . Cancer Paternal Grandmother   . Diabetes Paternal Grandmother     Social History Social History   Tobacco Use  . Smoking status: Current Every Day Smoker    Packs/day: 1.00    Years: 34.00    Pack years: 34.00    Types: Cigarettes    Start date: 05/13/1985  . Smokeless tobacco: Never Used  Substance Use Topics  . Alcohol use: Not Currently    Alcohol/week: 0.0 standard drinks    Comment: rarely  . Drug use: Not Currently    Types: Marijuana    Comment: none since August 2019    Review of Systems  Constitutional: No fever/chills Eyes: No visual changes. ENT: No sore throat. Cardiovascular: Denies chest pain. Respiratory: Denies shortness of breath. Gastrointestinal: Positive for abdominal pain.  Positive for nausea, no vomiting.  No diarrhea.  No constipation. Genitourinary: Negative for dysuria. Musculoskeletal: Negative  for back pain. Skin: Negative for rash. Neurological: Negative for headaches, focal weakness or numbness.  ____________________________________________   PHYSICAL EXAM:  VITAL SIGNS: ED Triage Vitals  Enc Vitals Group     BP 02/04/20 1448 117/63     Pulse Rate 02/04/20 1448 96     Resp 02/04/20 1448 20     Temp 02/04/20 1448 98.6 F (37 C)     Temp Source 02/04/20 1448 Oral     SpO2 02/04/20 1448 99 %     Weight 02/04/20 1452 145 lb (65.8 kg)     Height 02/04/20 1452 '4\' 11"'  (1.499 m)     Head Circumference --      Peak Flow --      Pain Score 02/04/20 1505 0     Pain Loc --      Pain Edu? --      Excl. in [place]? --     Constitutional: Alert and oriented. Eyes: Conjunctivae are normal. Head: Atraumatic. Nose: No congestion/rhinnorhea. Mouth/Throat: Mucous membranes are moist. Neck: Normal ROM Cardiovascular: Normal rate, regular rhythm. Grossly normal heart sounds. Respiratory: Normal respiratory effort.  No retractions. Lungs CTAB. Gastrointestinal: Soft and nontender. No  distention. Genitourinary: deferred Musculoskeletal: No lower extremity tenderness nor edema. Neurologic:  Normal speech and language. No gross focal neurologic deficits are appreciated. Skin:  Skin is warm, dry and intact. No rash noted. Psychiatric: Mood and affect are normal. Speech and behavior are normal.  ____________________________________________   LABS (all labs ordered are listed, but only abnormal results are displayed)  Labs Reviewed  BASIC METABOLIC PANEL - Abnormal; Notable for the following components:      Result Value   Glucose, Bld 105 (*)    Creatinine, Ser 1.03 (*)    All other components within normal limits  URINALYSIS, COMPLETE (UACMP) WITH MICROSCOPIC - Abnormal; Notable for the following components:   Color, Urine YELLOW (*)    APPearance CLEAR (*)    Hgb urine dipstick SMALL (*)    All other components within normal limits  HEPATIC FUNCTION PANEL - Abnormal; Notable for the following components:   AST 14 (*)    All other components within normal limits  CBC  LIPASE, BLOOD  POC URINE PREG, ED  POCT PREGNANCY, URINE  TROPONIN I (HIGH SENSITIVITY)   ____________________________________________  EKG  ED ECG REPORT I, Blake Divine, the attending physician, personally viewed and interpreted this ECG.   Date: 02/04/2020  EKG Time: 14:56  Rate: 90  Rhythm: normal sinus rhythm  Axis: RAD  Intervals:none  ST&T Change: None   PROCEDURES  Procedure(s) performed (including Critical Care):  Procedures   ____________________________________________   INITIAL IMPRESSION / ASSESSMENT AND PLAN / ED COURSE       49 year old female presents to the ED complaining of sharp left upper quadrant pain that seems to be worse when she eats certain foods and also exacerbated at times by a deep breath.  She denies any fevers, cough, chest pain, or shortness of breath and is currently asymptomatic at this time.  I doubt PE, EKG and troponin are negative, no  evidence of ACS.  Chest x-ray shows questionable infiltrate in right lower lung fields, but given no fevers or cough, I suspect this is atelectasis.  If lab work is unremarkable, I suspect her symptoms are related to gastritis, she has no tenderness on abdominal exam currently.  No reason to suspect biliary pathology.  Lab work is reassuring, LFTs and lipase within normal limits.  Patient remains pain-free and I have advised her to take Pepcid as needed on top of her usual omeprazole.  Patient was counseled to follow-up with her PCP and otherwise return to the ED for new or worsening symptoms, patient agrees with plan.      ____________________________________________   FINAL CLINICAL IMPRESSION(S) / ED DIAGNOSES  Final diagnoses:  Acute gastritis without hemorrhage, unspecified gastritis type     ED Discharge Orders    None       Note:  This document was prepared using Dragon voice recognition software and may include unintentional dictation errors.   Blake Divine, MD 02/04/20 [phone]

## 2020-02-04 NOTE — ED Triage Notes (Signed)
Pt to the er for left side rib pain under the left breast x a few years. Pt thinks it is gallstones. Pt reports pain with deep inspiration. Pain goes into the lower abd.

## 2020-02-04 NOTE — Telephone Encounter (Signed)
  Patient called stating that she has had severe chest pain that takes her breath.  She states the pain is under her breast on her lower left side. She rates the pain at 10 lasting 10-15 minutes.  She notices that exertion will cause the pain.  She doesn't notice that food or eating effects it.  When the pain comes she struggles to take a breath. She has had pain like this when she was pregnant. Care advice read to patient.  She will go to ER for evaluation of her symptoms. Reason for Disposition . [1] Chest pain (or "angina") comes and goes AND [2] is happening more often (increasing in frequency) or getting worse (increasing in severity)  Answer Assessment - Initial Assessment Questions 1. LOCATION: "Where does it hurt?"       Left chest under breast 2. RADIATION: "Does the pain go anywhere else?" (e.g., into neck, jaw, arms, back)    back 3. ONSET: "When did the chest pain begin?" (Minutes, hours or days)     Years ago when pregnant 4. PATTERN "Does the pain come and go, or has it been constant since it started?"  "Does it get worse with exertion?"      Comes and goes gets worse with exertion 5. DURATION: "How long does it last" (e.g., seconds, minutes, hours)      10 15 minutes 6. SEVERITY: "How bad is the pain?"  (e.g., Scale 1-10; mild, moderate, or severe)    - MILD (1-3): doesn't interfere with normal activities     - MODERATE (4-7): interferes with normal activities or awakens from sleep    - SEVERE (8-10): excruciating pain, unable to do any normal activities       10 7. CARDIAC RISK FACTORS: "Do you have any history of heart problems or risk factors for heart disease?" (e.g., angina, prior heart attack; diabetes, high blood pressure, high cholesterol, smoker, or strong family history of heart disease)    Pre diabetic all of the above 8. PULMONARY RISK FACTORS: "Do you have any history of lung disease?"  (e.g., blood clots in lung, asthma, emphysema, birth control pills)     COPD 9.  CAUSE: "What do you think is causing the chest pain?"    Gull bladder 10. OTHER SYMPTOMS: "Do you have any other symptoms?" (e.g., dizziness, nausea, vomiting, sweating, fever, difficulty breathing, cough)      Vomiting Difficult to breath with pain 11. PREGNANCY: "Is there any chance you are pregnant?" "When was your last menstrual period?"       No menopaus  Protocols used: CHEST PAIN-A-AH

## 2020-02-25 ENCOUNTER — Other Ambulatory Visit: Admission: RE | Admit: 2020-02-25 | Payer: Medicare Other | Source: Ambulatory Visit

## 2020-02-26 ENCOUNTER — Ambulatory Visit: Payer: Medicare Other

## 2020-03-05 ENCOUNTER — Other Ambulatory Visit: Payer: Self-pay | Admitting: Family Medicine

## 2020-03-05 DIAGNOSIS — K5909 Other constipation: Secondary | ICD-10-CM

## 2020-03-30 ENCOUNTER — Other Ambulatory Visit: Payer: Self-pay | Admitting: Family Medicine

## 2020-03-30 DIAGNOSIS — Z1231 Encounter for screening mammogram for malignant neoplasm of breast: Secondary | ICD-10-CM

## 2020-04-05 ENCOUNTER — Other Ambulatory Visit: Payer: Self-pay | Admitting: Family Medicine

## 2020-04-05 DIAGNOSIS — K219 Gastro-esophageal reflux disease without esophagitis: Secondary | ICD-10-CM

## 2020-04-05 NOTE — Telephone Encounter (Signed)
Requested Prescriptions  Pending Prescriptions Disp Refills  . pantoprazole (PROTONIX) 40 MG tablet [Pharmacy Med Name: PANTOPRAZOLE SODIUM 40 MG DR TAB] 90 tablet 1    Sig: TAKE 1 TABLET BY MOUTH DAILY AT 6 AM     Gastroenterology: Proton Pump Inhibitors Passed - 04/05/2020 10:14 AM      Passed - Valid encounter within last 12 months    Recent Outpatient Visits          6 months ago Type 2 diabetes mellitus with microalbuminuria, without long-term current use of insulin [facility])   [facility] [place], Drue Stager, MD   9 months ago Type 2 diabetes mellitus with microalbuminuria, without long-term current use of insulin [facility])   [place], [place], FNP   1 year ago Type 2 diabetes mellitus with microalbuminuria, without long-term current use of insulin [facility])   [place], NP   1 year ago Proteinuria, unspecified type   [place], NP   1 year ago Oral thrush   [place], NP      Future Appointments            In 1 week Tyler Pita, MD [place]   In 3 months [facility] [place], Herbert Seta, [place]

## 2020-04-09 ENCOUNTER — Other Ambulatory Visit: Payer: Self-pay | Admitting: Family Medicine

## 2020-04-09 DIAGNOSIS — K219 Gastro-esophageal reflux disease without esophagitis: Secondary | ICD-10-CM

## 2020-04-09 DIAGNOSIS — I10 Essential (primary) hypertension: Secondary | ICD-10-CM

## 2020-04-09 DIAGNOSIS — E1129 Type 2 diabetes mellitus with other diabetic kidney complication: Secondary | ICD-10-CM

## 2020-04-09 NOTE — Telephone Encounter (Signed)
Call to patient- appointment scheduled- courtesy RF given

## 2020-04-15 ENCOUNTER — Ambulatory Visit: Payer: Medicare Other | Admitting: Pulmonary Disease

## 2020-04-15 NOTE — Addendum Note (Signed)
Addended by: Clemetine Marker D on: 04/15/2020 10:02 AM   Modules accepted: Level of Service

## 2020-04-21 ENCOUNTER — Encounter: Payer: Self-pay | Admitting: Family Medicine

## 2020-04-21 ENCOUNTER — Ambulatory Visit (INDEPENDENT_AMBULATORY_CARE_PROVIDER_SITE_OTHER): Payer: Medicare Other | Admitting: Family Medicine

## 2020-04-21 ENCOUNTER — Other Ambulatory Visit: Payer: Self-pay

## 2020-04-21 VITALS — BP 132/78 | HR 109 | Temp 97.1°F | Resp 20 | Ht 59.0 in | Wt 148.5 lb

## 2020-04-21 DIAGNOSIS — E1129 Type 2 diabetes mellitus with other diabetic kidney complication: Secondary | ICD-10-CM | POA: Diagnosis not present

## 2020-04-21 DIAGNOSIS — F319 Bipolar disorder, unspecified: Secondary | ICD-10-CM | POA: Diagnosis not present

## 2020-04-21 DIAGNOSIS — K219 Gastro-esophageal reflux disease without esophagitis: Secondary | ICD-10-CM

## 2020-04-21 DIAGNOSIS — R809 Proteinuria, unspecified: Secondary | ICD-10-CM | POA: Diagnosis not present

## 2020-04-21 DIAGNOSIS — I10 Essential (primary) hypertension: Secondary | ICD-10-CM | POA: Diagnosis not present

## 2020-04-21 DIAGNOSIS — E785 Hyperlipidemia, unspecified: Secondary | ICD-10-CM

## 2020-04-21 DIAGNOSIS — J449 Chronic obstructive pulmonary disease, unspecified: Secondary | ICD-10-CM

## 2020-04-21 DIAGNOSIS — K5909 Other constipation: Secondary | ICD-10-CM

## 2020-04-21 DIAGNOSIS — R1032 Left lower quadrant pain: Secondary | ICD-10-CM | POA: Diagnosis not present

## 2020-04-21 DIAGNOSIS — E032 Hypothyroidism due to medicaments and other exogenous substances: Secondary | ICD-10-CM | POA: Diagnosis not present

## 2020-04-21 DIAGNOSIS — R300 Dysuria: Secondary | ICD-10-CM

## 2020-04-21 DIAGNOSIS — Z1211 Encounter for screening for malignant neoplasm of colon: Secondary | ICD-10-CM

## 2020-04-21 MED ORDER — ROSUVASTATIN CALCIUM 20 MG PO TABS: 20.0000 mg | ORAL_TABLET | Freq: Every day | ORAL | 0 refills | Status: DC

## 2020-04-21 MED ORDER — LUBIPROSTONE 24 MCG PO CAPS: 24.0000 ug | ORAL_CAPSULE | Freq: Two times a day (BID) | ORAL | 0 refills | Status: DC

## 2020-04-21 MED ORDER — LOSARTAN POTASSIUM 50 MG PO TABS: 50.0000 mg | ORAL_TABLET | Freq: Every day | ORAL | 1 refills | Status: DC

## 2020-04-21 MED ORDER — AMOXICILLIN-POT CLAVULANATE 875-125 MG PO TABS: 1.0000 | ORAL_TABLET | Freq: Two times a day (BID) | ORAL | 0 refills | Status: DC

## 2020-04-21 NOTE — Patient Instructions (Signed)
rx of Trelegy is at [place]

## 2020-04-21 NOTE — Progress Notes (Signed)
Name: April Luna   MRN: [id number]    DOB: May 17, 1971   Date:04/21/2020       Progress Note  Subjective  Chief Complaint  Chief Complaint  Patient presents with  . Medication Refill    Bladder pain and side cramping paints wants to be checked for UTI  . Diabetes  . Hypertension    Denies any symptoms  . Constipation  . COPD    Needs refill on inhalers-ran out today  . Hyperlipidemia  . Manic Behavior  . Hypothyroidism    HPI  COPD: she is back on Trelegy given by Dr. Patsey Berthold but states ran out of refills, explained rx was sent to [facility] and she can ask [facility] to get refills for her.  She has chronic cough and sputum , she states sputum is now yellow.. She is still smoking, currently less than one pack per day, used to smoke close to 2 packs in the past . Advised to get Trelegy filled   HTN:bp is at goal, since medication was adjusted no longer having dizziness. No chest pain or palpitation   Hyperlipidemia: we switched from Pravachol to [facility] in April 2019 and LDL improved, however she states has difficulty taking medication at night and last LDL was up again, she is now taking it in am and we will recheck levels today  Recurrent WIO:MBTDHR Urologist, has a history of kidney stones, and had lithotripsy , taking ditropan because of urinary urgency and is doing better. She has some dysuria and we will check urine culture today   Chronic constipation:she states she has been taking Amitiza once a day and is doing well. She has bowel movements daily, no straining , abdominal pain or blood in stools. Recently had increase in diarrhea associated with LLQ pain  LLQ pain: she has noticed recurrent episodes of LLQ pain, described as cramping, associated with diarrhea, but no blood in stools, but has noticed mucus. No fever, chills, nausea or vomiting.   DMII: with dyslipidemia, on diet only, last A1C was controlled, she states mouth is always dry, but no  polyphagia or polyuria. She is on statin therapyshe has a history of microalbuminuria and CKI stage III , seen by podiatrist. We will recheck labs. She states not very complaint with a diabetic diet lately  Bipolar disorder:she is seeing psychiatrist Dr. Holley Raring,   taking medications as prescribed , no recent admissions to psychiatric unit. Last admission was 2016 for a psychotic episode. No history of suicide attempts. She states she has been hypomanic today   Hypothyroidism: she is taking levothyroxine and denies hair loss, she has chronic dry skin . Last TSH was at goal , we will recheck level today   Chronic right lower back pain: going on for years, she states got better when adjusted, seems to be right on top of sacro- iliac joint, she states occasionally shoots down to her right lower leg. Taking gabapentin . Advised Tylenol. Pain is daily, aching worse on the right side, stable     Patient Active Problem List   Diagnosis Date Noted  . Plantar callus 10/06/2019  . Acquired hallux limitus of both feet 10/06/2019  . Morbid obesity ([place]) 06/24/2019  . Type 2 diabetes mellitus with microalbuminuria, without long-term current use of insulin ([place]) 06/24/2019  . Chronic obstructive pulmonary disease ([place]) 06/24/2019  . Chronic constipation 06/24/2019  . ASCUS of cervix with negative high risk HPV 09/05/2018  . GERD without esophagitis 04/11/2018  . Hyperlipidemia 04/11/2018  .  Hypertension 04/11/2018  . Hypothyroidism 04/26/2015  . Bipolar I disorder, most recent episode (or current) manic ([place]) 04/25/2015  . Delirium due to another medical condition 04/25/2015  . Cannabis abuse 04/25/2015    Past Surgical History:  Procedure Laterality Date  . CESAREAN SECTION    . CYSTOSCOPY W/ RETROGRADES Left 11/08/2018   Procedure: CYSTOSCOPY WITH RETROGRADE PYELOGRAM;  Surgeon: Billey Co, MD;  Location: [facility] ORS;  Service: Urology;  Laterality: Left;  .  CYSTOSCOPY/URETEROSCOPY/HOLMIUM LASER/STENT PLACEMENT Left 11/08/2018   Procedure: CYSTOSCOPY/URETEROSCOPY/HOLMIUM LASER/STENT PLACEMENT;  Surgeon: Billey Co, MD;  Location: [facility] ORS;  Service: Urology;  Laterality: Left;  . MOUTH SURGERY     wisdom teeth extraction    Family History  Problem Relation Age of Onset  . Depression Mother   . Anxiety disorder Mother   . Diabetes Mother   . Hypertension Mother   . Hyperlipidemia Mother   . Cancer Mother   . Cancer Father        colon cancer  . Depression Brother   . Anxiety disorder Brother   . Diabetes Mellitus II Maternal Grandmother   . Hypercholesterolemia Maternal Grandmother   . Cancer Paternal Grandmother   . Diabetes Paternal Grandmother     Social History   Tobacco Use  . Smoking status: Current Every Day Smoker    Packs/day: 1.00    Years: 34.00    Pack years: 34.00    Types: Cigarettes    Start date: 05/13/1985  . Smokeless tobacco: Never Used  Substance Use Topics  . Alcohol use: Not Currently    Alcohol/week: 0.0 standard drinks    Comment: rarely     Current Outpatient Medications:  .  albuterol (PROVENTIL) (2.5 MG/3ML) 0.083% nebulizer solution, Take 3 mLs (2.5 mg total) by nebulization every 6 (six) hours as needed for wheezing or shortness of breath., Disp: 150 mL, Rfl: 1 .  amantadine (SYMMETREL) 100 MG capsule, Take 100 mg by mouth 2 (two) times daily., Disp: , Rfl:  .  blood glucose meter kit and supplies, Dispense based on patient and insurance preference. Use up to four times daily as directed. (FOR ICD-10 E10.9, E11.9)., Disp: 1 each, Rfl: 0 .  buPROPion (WELLBUTRIN XL) 150 MG 24 hr tablet, Take 150 mg by mouth daily., Disp: , Rfl:  .  Cholecalciferol (VITAMIN D-3) 125 MCG (5000 UT) TABS, Take 5,000 Units by mouth daily., Disp: 30 tablet, Rfl: 1 .  Fluticasone-Umeclidin-Vilant (TRELEGY ELLIPTA) 100-62.5-25 MCG/INH AEPB, Inhale 1 puff into the lungs daily., Disp: 60 each, Rfl: 6 .  gabapentin  (NEURONTIN) 300 MG capsule, Take 300 mg by mouth 2 (two) times daily., Disp: , Rfl:  .  hydrOXYzine (VISTARIL) 50 MG capsule, Take 50 mg by mouth 2 (two) times daily as needed for anxiety., Disp: , Rfl:  .  levothyroxine (SYNTHROID) 75 MCG tablet, TAKE 1 TABLET BY MOUTH DAILY AT 6 AM, Disp: 30 tablet, Rfl: 2 .  losartan (COZAAR) 50 MG tablet, Take 1 tablet (50 mg total) by mouth daily., Disp: 90 tablet, Rfl: 1 .  lubiprostone (AMITIZA) 24 MCG capsule, Take 1 capsule (24 mcg total) by mouth 2 (two) times daily with a meal., Disp: 180 capsule, Rfl: 0 .  nicotine (NICOTROL) 10 MG inhaler, Inhale 1 Cartridge (1 continuous puffing total) into the lungs as needed for smoking cessation., Disp: 36 each, Rfl: 3 .  oxybutynin (DITROPAN-XL) 10 MG 24 hr tablet, TAKE 1 TABLET BY MOUTH EACH DAY, Disp: 90 tablet, Rfl:  3 .  Paliperidone Palmitate ER (INVEGA TRINZA) 819 MG/2.625ML SUSY, Inject 819 mg into the muscle every 3 (three) months., Disp: , Rfl:  .  pantoprazole (PROTONIX) 40 MG tablet, TAKE 1 TABLET BY MOUTH DAILY AT 6 AM, Disp: 90 tablet, Rfl: 1 .  prazosin (MINIPRESS) 2 MG capsule, Take 2 mg by mouth at bedtime., Disp: , Rfl:  .  rosuvastatin (CRESTOR) 20 MG tablet, Take 1 tablet (20 mg total) by mouth daily., Disp: 90 tablet, Rfl: 0 .  sertraline (ZOLOFT) 100 MG tablet, Take 200 mg by mouth daily., Disp: , Rfl:   Allergies  Allergen Reactions  . Metformin And Related Nausea And Vomiting  . Penicillins Other (See Comments)    GI distress Has patient had a PCN reaction causing immediate rash, facial/tongue/throat swelling, SOB or lightheadedness with hypotension: No Has patient had a PCN reaction causing severe rash involving mucus membranes or skin necrosis: No Has patient had a PCN reaction that required hospitalization: No Has patient had a PCN reaction occurring within the last 10 years: No If all of the above answers are "NO", then may proceed with Cephalosporin use.   Marland Kitchen Perphenazine Other (See  Comments)    Tremors, muscle weakness, tongue swelling  . Sulfa Antibiotics Other (See Comments)    GI distress   . Abilify [Aripiprazole] Rash    I personally reviewed active problem list, medication list, allergies, family history, social history, health maintenance with the patient/caregiver today.   ROS  Constitutional: Negative for fever or weight change.  Respiratory: Positive  for cough and shortness of breath.   Cardiovascular: Negative for chest pain or palpitations.  Gastrointestinal: Positive  for abdominal pain and  bowel changes.  Musculoskeletal: Negative for gait problem or joint swelling.  Skin: Negative for rash.  Neurological: Negative for dizziness or headache.  No other specific complaints in a complete review of systems (except as listed in HPI above).  Objective  Vitals:   04/21/20 1104  BP: 132/78  Pulse: (!) 109  Resp: 20  Temp: (!) 97.1 F (36.2 C)  TempSrc: Temporal  SpO2: 94%  Weight: 148 lb 8 oz (67.4 kg)  Height: 4' 11" (1.499 m)    Body mass index is 29.99 kg/m.  Physical Exam  Constitutional: Patient appears well-developed and well-nourished. Obese  No distress.  HEENT: head atraumatic, normocephalic, pupils equal and reactive to light Cardiovascular: Normal rate, regular rhythm and normal heart sounds.  No murmur heard. No BLE edema. Pulmonary/Chest: Effort normal and breath sounds normal. No respiratory distress. Abdominal: Soft.  There is no tenderness. Psychiatric: Patient has a normal mood and affect. behavior is normal. Judgment and thought content normal.  Recent Results (from the past 2160 hour(s))  Basic metabolic panel     Status: Abnormal   Collection Time: 02/04/20  3:08 PM  Result Value Ref Range   Sodium 136 135 - 145 mmol/L   Potassium 4.1 3.5 - 5.1 mmol/L   Chloride 102 98 - 111 mmol/L   CO2 25 22 - 32 mmol/L   Glucose, Bld 105 (H) 70 - 99 mg/dL   BUN 12 6 - 20 mg/dL   Creatinine, Ser 1.03 (H) 0.44 - 1.00 mg/dL    Calcium 9.3 8.9 - 10.3 mg/dL   GFR calc non Af Amer >60 >60 mL/min   GFR calc Af Amer >60 >60 mL/min   Anion gap 9 5 - 15    Comment: Performed at [facility], [address] [place],  [place] [address]  CBC     Status: None   Collection Time: 02/04/20  3:08 PM  Result Value Ref Range   WBC 10.0 4.0 - 10.5 K/uL   RBC 4.38 3.87 - 5.11 MIL/uL   Hemoglobin 13.6 12.0 - 15.0 g/dL   HCT 39.9 36.0 - 46.0 %   MCV 91.1 80.0 - 100.0 fL   MCH 31.1 26.0 - 34.0 pg   MCHC 34.1 30.0 - 36.0 g/dL   RDW 13.2 11.5 - 15.5 %   Platelets 159 150 - 400 K/uL   nRBC 0.0 0.0 - 0.2 %    Comment: Performed at [facility], [address], [place], [place] [address]  Troponin I (High Sensitivity)     Status: None   Collection Time: 02/04/20  3:08 PM  Result Value Ref Range   Troponin I (High Sensitivity) <2 <18 ng/L    Comment: (NOTE) Elevated high sensitivity troponin I (hsTnI) values and significant  changes across serial measurements may suggest ACS but many other  chronic and acute conditions are known to elevate hsTnI results.  Refer to the "Links" section for chest pain algorithms and additional  guidance. Performed at [facility], [address] [place], [place] [address]   Urinalysis, Complete w Microscopic     Status: Abnormal   Collection Time: 02/04/20  3:08 PM  Result Value Ref Range   Color, Urine YELLOW (A) YELLOW   APPearance CLEAR (A) CLEAR   Specific Gravity, Urine 1.010 1.005 - 1.030   pH 6.0 5.0 - 8.0   Glucose, UA NEGATIVE NEGATIVE mg/dL   Hgb urine dipstick SMALL (A) NEGATIVE   Bilirubin Urine NEGATIVE NEGATIVE   Ketones, ur NEGATIVE NEGATIVE mg/dL   Protein, ur NEGATIVE NEGATIVE mg/dL   Nitrite NEGATIVE NEGATIVE   Leukocytes,Ua NEGATIVE NEGATIVE   RBC / HPF 0-5 0 - 5 RBC/hpf   WBC, UA 0-5 0 - 5 WBC/hpf   Bacteria, UA NONE SEEN NONE SEEN   Squamous Epithelial / LPF 0-5 0 - 5    Comment: Performed at [facility], [address] [place], [place] [address]  Lipase, blood     Status: None   Collection Time: 02/04/20  3:08 PM  Result Value Ref Range   Lipase 40 11 - 51 U/L    Comment: Performed at [facility], [address] [place], [place] [address]  Hepatic function panel     Status: Abnormal   Collection Time: 02/04/20  3:08 PM  Result Value Ref Range   Total Protein 6.8 6.5 - 8.1 g/dL   Albumin 3.7 3.5 - 5.0 g/dL   AST 14 (L) 15 - 41 U/L   ALT 14 0 - 44 U/L   Alkaline Phosphatase 94 38 - 126 U/L   Total Bilirubin 0.6 0.3 - 1.2 mg/dL   Bilirubin, Direct <0.1 0.0 - 0.2 mg/dL   Indirect Bilirubin NOT CALCULATED 0.3 - 0.9 mg/dL    Comment: Performed at [facility], [address] [place], [place] [address]  Pregnancy, urine POC     Status: None   Collection Time: 02/04/20  3:12 PM  Result Value Ref Range   Preg Test, Ur NEGATIVE NEGATIVE    Comment:        THE SENSITIVITY OF THIS METHODOLOGY IS >24 mIU/mL     Diabetic Foot Exam: Diabetic Foot Exam - Simple   Simple Foot Form Diabetic Foot exam was performed with the following findings: Yes 04/21/2020  12:17 PM  Visual Inspection No deformities, no ulcerations, no other skin breakdown bilaterally: Yes Sensation Testing Intact to touch and monofilament testing bilaterally: Yes Pulse Check Posterior Tibialis and Dorsalis pulse intact bilaterally: Yes Comments      PHQ2/9: Depression screen [facility] 2/9 04/21/2020 09/24/2019 06/24/2019 02/04/2019 01/28/2019  Decreased Interest 0 _0 Down, Depressed, Hopeless 0 0 _1 PHQ - 2 Score 0 _2 Altered sleeping 0 0 0 0 2  Tired, decreased energy _3 Change in appetite 2 1 0 2 2  Feeling bad or failure about yourself  0 0 0 2 1  Trouble concentrating _4 0  Moving slowly or fidgety/restless 1 0 0 2 0  Suicidal thoughts 0 0 0 0 0  PHQ-9 Score _5 Difficult doing work/chores Somewhat difficult Somewhat difficult Somewhat difficult Somewhat difficult Somewhat  difficult  Some recent data might be hidden    phq 9 is positive   Fall Risk: Fall Risk  04/21/2020 09/24/2019 06/24/2019 02/04/2019 01/28/2019  Falls in the past year? 0 0 0 - 0  Number falls in past yr: 0 0 0 0 0  Injury with Fall? 0 0 0 0 0  Follow up - - Falls evaluation completed - Falls evaluation completed      Functional Status Survey: Is the patient deaf or have difficulty hearing?: No Does the patient have difficulty seeing, even when wearing glasses/contacts?: No Does the patient have difficulty concentrating, remembering, or making decisions?: No Does the patient have difficulty walking or climbing stairs?: No Does the patient have difficulty dressing or bathing?: No Does the patient have difficulty doing errands alone such as visiting a doctor's office or shopping?: No    Assessment & Plan  1. Type 2 diabetes mellitus with microalbuminuria, without long-term current use of insulin (HCC)  - Urine Microalbumin w/creat. ratio - Hemoglobin A1c - losartan (COZAAR) 50 MG tablet; Take 1 tablet (50 mg total) by mouth daily.  Dispense: 90 tablet; Refill: 1  2. Bipolar I disorder, most recent episode (or current) manic ([place])   3. GERD without esophagitis   4. Essential hypertension  - CBC with Differential/Platelet - COMPLETE METABOLIC PANEL WITH GFR - losartan (COZAAR) 50 MG tablet; Take 1 tablet (50 mg total) by mouth daily.  Dispense: 90 tablet; Refill: 1   5. Hypothyroidism due to medication  - TSH  6. Chronic obstructive pulmonary disease, unspecified COPD type ([place])  Out of medication and having more rhonchi, we will augmentin for possible diverticulitis   7. Chronic constipation  - lubiprostone (AMITIZA) 24 MCG capsule; Take 1 capsule (24 mcg total) by mouth 2 (two) times daily with a meal.  Dispense: 180 capsule; Refill: 0  8. Left lower quadrant abdominal pain  - CBC with Differential/Platelet - COMPLETE METABOLIC PANEL WITH GFR - Ambulatory  referral to Gastroenterology  She told me during the visit she has a remote history of PNC allergy but has taken amoxicillin in the past without problems  9. Hyperlipidemia, unspecified hyperlipidemia type  - Lipid panel  10. Dysuria  - CULTURE, URINE COMPREHENSIVE  11. Colon cancer screening  - Ambulatory referral to Gastroenterology

## 2020-04-22 ENCOUNTER — Other Ambulatory Visit: Payer: Self-pay | Admitting: Family Medicine

## 2020-04-22 DIAGNOSIS — K219 Gastro-esophageal reflux disease without esophagitis: Secondary | ICD-10-CM

## 2020-04-22 LAB — MICROALBUMIN / CREATININE URINE RATIO
Creatinine, Urine: 78 mg/dL (ref 20–275)
Microalb Creat Ratio: 62 mcg/mg creat — ABNORMAL HIGH (ref <30)
Microalb, Ur: 4.8 mg/dL

## 2020-04-23 ENCOUNTER — Telehealth: Payer: Self-pay

## 2020-04-23 LAB — CBC WITH DIFFERENTIAL/PLATELET
Absolute Monocytes: 678 cells/uL (ref 200–950)
Basophils Absolute: 79 cells/uL (ref 0–200)
Basophils Relative: 0.9 %
Eosinophils Absolute: 114 cells/uL (ref 15–500)
Eosinophils Relative: 1.3 %
HCT: 46 % — ABNORMAL HIGH (ref 35.0–45.0)
Hemoglobin: 15 g/dL (ref 11.7–15.5)
Lymphs Abs: 2138 cells/uL (ref 850–3900)
MCH: 30.4 pg (ref 27.0–33.0)
MCHC: 32.6 g/dL (ref 32.0–36.0)
MCV: 93.3 fL (ref 80.0–100.0)
MPV: 9.5 fL (ref 7.5–12.5)
Monocytes Relative: 7.7 %
Neutro Abs: 5790 cells/uL (ref 1500–7800)
Neutrophils Relative %: 65.8 %
Platelets: 151 10*3/uL (ref 140–400)
RBC: 4.93 10*6/uL (ref 3.80–5.10)
RDW: 14.3 % (ref 11.0–15.0)
Total Lymphocyte: 24.3 %
WBC: 8.8 10*3/uL (ref 3.8–10.8)

## 2020-04-23 LAB — LIPID PANEL
Cholesterol: 272 mg/dL — ABNORMAL HIGH (ref <200)
HDL: 43 mg/dL — ABNORMAL LOW (ref >=50)
LDL Cholesterol (Calc): 178 mg/dL (calc) — ABNORMAL HIGH
Non-HDL Cholesterol (Calc): 229 mg/dL (calc) — ABNORMAL HIGH (ref <130)
Total CHOL/HDL Ratio: 6.3 (calc) — ABNORMAL HIGH (ref <5.0)
Triglycerides: 301 mg/dL — ABNORMAL HIGH (ref <150)

## 2020-04-23 LAB — COMPLETE METABOLIC PANEL WITH GFR
AG Ratio: 1.6 (calc) (ref 1.0–2.5)
ALT: 7 U/L (ref 6–29)
AST: 12 U/L (ref 10–35)
Albumin: 4.4 g/dL (ref 3.6–5.1)
Alkaline phosphatase (APISO): 106 U/L (ref 31–125)
BUN: 16 mg/dL (ref 7–25)
CO2: 27 mmol/L (ref 20–32)
Calcium: 9.3 mg/dL (ref 8.6–10.2)
Chloride: 102 mmol/L (ref 98–110)
Creat: 0.93 mg/dL (ref 0.50–1.10)
GFR, Est African American: 85 mL/min/{1.73_m2} (ref >=60)
GFR, Est Non African American: 73 mL/min/{1.73_m2} (ref >=60)
Globulin: 2.7 g/dL (calc) (ref 1.9–3.7)
Glucose, Bld: 112 mg/dL — ABNORMAL HIGH (ref 65–99)
Potassium: 4 mmol/L (ref 3.5–5.3)
Sodium: 137 mmol/L (ref 135–146)
Total Bilirubin: 0.4 mg/dL (ref 0.2–1.2)
Total Protein: 7.1 g/dL (ref 6.1–8.1)

## 2020-04-23 LAB — CULTURE, URINE COMPREHENSIVE
MICRO NUMBER:: 10419906
RESULT:: NO GROWTH
SPECIMEN QUALITY:: ADEQUATE

## 2020-04-23 LAB — TSH: TSH: 2.17 mIU/L

## 2020-04-23 LAB — HEMOGLOBIN A1C
Hgb A1c MFr Bld: 5.9 % of total Hgb — ABNORMAL HIGH (ref <5.7)
Mean Plasma Glucose: 123 (calc)
eAG (mmol/L): 6.8 (calc)

## 2020-04-23 NOTE — Telephone Encounter (Signed)
Copied from [place] [phone]. Topic: General - Other >> Apr 22, 2020  1:28 PM Rainey Pines A wrote: Pharmacy requesting callback from Dr. Ancil Boozer or nurse I nregards to prescriptions that were sent over .Pharmacy wanted to know if Dr .Ancil Boozer was aware of patients allergy to medication. Please advise [place], [place]  Phone:  [phone] Fax:  [phone]

## 2020-04-26 ENCOUNTER — Other Ambulatory Visit: Payer: Self-pay | Admitting: Family Medicine

## 2020-04-26 DIAGNOSIS — E1169 Type 2 diabetes mellitus with other specified complication: Secondary | ICD-10-CM

## 2020-04-26 MED ORDER — ICOSAPENT ETHYL 1 G PO CAPS: 2.0000 g | ORAL_CAPSULE | Freq: Two times a day (BID) | ORAL | 5 refills | Status: DC

## 2020-04-26 MED ORDER — ROSUVASTATIN CALCIUM 40 MG PO TABS: 40.0000 mg | ORAL_TABLET | Freq: Every day | ORAL | 5 refills | Status: DC

## 2020-04-30 ENCOUNTER — Other Ambulatory Visit: Payer: Self-pay | Admitting: Family Medicine

## 2020-04-30 DIAGNOSIS — E032 Hypothyroidism due to medicaments and other exogenous substances: Secondary | ICD-10-CM

## 2020-05-11 ENCOUNTER — Ambulatory Visit: Payer: Medicare Other

## 2020-05-21 ENCOUNTER — Encounter: Payer: Self-pay | Admitting: Urology

## 2020-05-25 ENCOUNTER — Ambulatory Visit: Payer: Medicare Other | Admitting: Family Medicine

## 2020-05-25 ENCOUNTER — Encounter: Payer: Self-pay | Admitting: Family Medicine

## 2020-05-27 NOTE — Progress Notes (Signed)
Cancelled two hours prior to appointment

## 2020-06-29 ENCOUNTER — Ambulatory Visit (INDEPENDENT_AMBULATORY_CARE_PROVIDER_SITE_OTHER): Payer: Medicare Other

## 2020-06-29 ENCOUNTER — Other Ambulatory Visit: Payer: Self-pay

## 2020-06-29 ENCOUNTER — Telehealth: Payer: Self-pay | Admitting: Family Medicine

## 2020-06-29 VITALS — BP 112/72 | HR 85 | Temp 97.1°F | Resp 16 | Ht 59.0 in | Wt 151.5 lb

## 2020-06-29 DIAGNOSIS — E1129 Type 2 diabetes mellitus with other diabetic kidney complication: Secondary | ICD-10-CM

## 2020-06-29 DIAGNOSIS — K219 Gastro-esophageal reflux disease without esophagitis: Secondary | ICD-10-CM | POA: Diagnosis not present

## 2020-06-29 DIAGNOSIS — Z0000 Encounter for general adult medical examination without abnormal findings: Secondary | ICD-10-CM | POA: Diagnosis not present

## 2020-06-29 DIAGNOSIS — J449 Chronic obstructive pulmonary disease, unspecified: Secondary | ICD-10-CM

## 2020-06-29 DIAGNOSIS — F319 Bipolar disorder, unspecified: Secondary | ICD-10-CM | POA: Diagnosis not present

## 2020-06-29 DIAGNOSIS — R809 Proteinuria, unspecified: Secondary | ICD-10-CM

## 2020-06-29 NOTE — Progress Notes (Signed)
Subjective:   April Luna is a 49 y.o. female who presents for Medicare Annual (Subsequent) preventive examination.  Review of Systems     Cardiac Risk Factors include: advanced age (>14mn, >>19women);diabetes mellitus;dyslipidemia;obesity (BMI >30kg/m2);smoking/ tobacco exposure;hypertension     Objective:    Today's Vitals   06/29/20 0816  BP: 112/72  Pulse: 85  Resp: 16  Temp: (!) 97.1 F (36.2 C)  TempSrc: Temporal  SpO2: 95%  Weight: 151 lb 8 oz (68.7 kg)  Height: _0  (1.499 m)   Body mass index is 30.6 kg/m.  Advanced Directives 06/29/2020 02/04/2020 08/08/2019 02/04/2019 11/08/2018 11/06/2018 01/24/2018  Does Patient Have a Medical Advance Directive? _1  No No  Would patient like information on creating a medical advance directive? No - Patient declined - No - Patient declined Yes (MAU/Ambulatory/Procedural Areas - Information given) - Yes (MAU/Ambulatory/Procedural Areas - Information given) No - Patient declined  Some encounter information is confidential and restricted. Go to Review Flowsheets activity to see all data.    Current Medications (verified) Outpatient Encounter Medications as of 06/29/2020  Medication Sig  . albuterol (PROVENTIL) (2.5 MG/3ML) 0.083% nebulizer solution Take 3 mLs (2.5 mg total) by nebulization every 6 (six) hours as needed for wheezing or shortness of breath.  .Marland Kitchenamantadine (SYMMETREL) 100 MG capsule Take 100 mg by mouth 2 (two) times daily.  . blood glucose meter kit and supplies Dispense based on patient and insurance preference. Use up to four times daily as directed. (FOR ICD-10 E10.9, E11.9).  .Marland KitchenbuPROPion (WELLBUTRIN XL) 150 MG 24 hr tablet Take 150 mg by mouth daily.  . Cholecalciferol (VITAMIN D-3) 125 MCG (5000 UT) TABS Take 5,000 Units by mouth daily.  . Fluticasone-Umeclidin-Vilant (TRELEGY ELLIPTA) 100-62.5-25 MCG/INH AEPB Inhale 1 puff into the lungs daily.  .Marland Kitchengabapentin (NEURONTIN) 300 MG capsule Take 300 mg  by mouth 2 (two) times daily.  . hydrOXYzine (VISTARIL) 50 MG capsule Take 50 mg by mouth 2 (two) times daily as needed for anxiety.  .Marland Kitchenicosapent Ethyl (VASCEPA) 1 g capsule Take 2 capsules (2 g total) by mouth 2 (two) times daily.  .Lorayne BenderSUSTENNA 234 MG/1.5ML SUSY injection Inject into the muscle. Inject as directed once a month  . levothyroxine (SYNTHROID) 75 MCG tablet TAKE 1 TABLET BY MOUTH DAILY AT 6 AM  . losartan (COZAAR) 50 MG tablet Take 1 tablet (50 mg total) by mouth daily.  .Marland Kitchenlubiprostone (AMITIZA) 24 MCG capsule Take 1 capsule (24 mcg total) by mouth 2 (two) times daily with a meal.  . oxybutynin (DITROPAN-XL) 10 MG 24 hr tablet TAKE 1 TABLET BY MOUTH EACH DAY  . pantoprazole (PROTONIX) 40 MG tablet TAKE 1 TABLET BY MOUTH DAILY AT 6 AM  . prazosin (MINIPRESS) 2 MG capsule Take 2 mg by mouth at bedtime.  . rosuvastatin (CRESTOR) 40 MG tablet Take 1 tablet (40 mg total) by mouth daily.  . sertraline (ZOLOFT) 100 MG tablet Take 200 mg by mouth daily.  . nicotine (NICOTROL) 10 MG inhaler Inhale 1 Cartridge (1 continuous puffing total) into the lungs as needed for smoking cessation. (Patient not taking: Reported on 06/29/2020)  . [DISCONTINUED] amoxicillin-clavulanate (AUGMENTIN) 875-125 MG tablet Take 1 tablet by mouth 2 (two) times daily.  . [DISCONTINUED] Paliperidone Palmitate ER (INVEGA TRINZA) 819 MG/2.625ML SUSY Inject 819 mg into the muscle every 3 (three) months.   No facility-administered encounter medications on file as of 06/29/2020.    Allergies (verified) Metformin  and related, Penicillins, Perphenazine, Sulfa antibiotics, and Abilify [aripiprazole]   History: Past Medical History:  Diagnosis Date  . Allergy    pollen  . Anxiety   . Bipolar 1 disorder ([place])   . COPD (chronic obstructive pulmonary disease) ([place])   . Depression   . Family history of adverse reaction to anesthesia    grand father had a stroke during anesthesia  . GERD (gastroesophageal reflux  disease)   . History of kidney stones   . Hyperlipidemia   . Hypertension   . Hypothyroidism   . Pneumonia   . Type 2 diabetes mellitus with microalbuminuria, without long-term current use of insulin ([place]) 06/24/2019   Past Surgical History:  Procedure Laterality Date  . CESAREAN SECTION    . CYSTOSCOPY W/ RETROGRADES Left 11/08/2018   Procedure: CYSTOSCOPY WITH RETROGRADE PYELOGRAM;  Surgeon: Billey Co, MD;  Location: [facility] ORS;  Service: Urology;  Laterality: Left;  . CYSTOSCOPY/URETEROSCOPY/HOLMIUM LASER/STENT PLACEMENT Left 11/08/2018   Procedure: CYSTOSCOPY/URETEROSCOPY/HOLMIUM LASER/STENT PLACEMENT;  Surgeon: Billey Co, MD;  Location: [facility] ORS;  Service: Urology;  Laterality: Left;  . MOUTH SURGERY     wisdom teeth extraction   Family History  Problem Relation Age of Onset  . Depression Mother   . Anxiety disorder Mother   . Diabetes Mother   . Hypertension Mother   . Hyperlipidemia Mother   . Cancer Mother   . Cancer Father        colon cancer  . Depression Brother   . Anxiety disorder Brother   . Diabetes Mellitus II Maternal Grandmother   . Hypercholesterolemia Maternal Grandmother   . Cancer Paternal Grandmother   . Diabetes Paternal Grandmother    Social History   Socioeconomic History  . Marital status: Married    Spouse name: Montine Circle  . Number of children: 1  . Years of education: 54  . Highest education level: High school graduate  Occupational History  . Occupation: unemployed    Comment: disabled  Tobacco Use  . Smoking status: Current Every Day Smoker    Packs/day: 1.00    Years: 34.00    Pack years: 34.00    Types: Cigarettes    Start date: 05/13/1985  . Smokeless tobacco: Never Used  Vaping Use  . Vaping Use: Former  . Start date: 05/25/2018  . Quit date: 09/24/2018  Substance and Sexual Activity  . Alcohol use: Not Currently    Alcohol/week: 0.0 standard drinks    Comment: rarely  . Drug use: Not Currently    Types:  Marijuana    Comment: none since August 2019  . Sexual activity: Not Currently    Birth control/protection: Post-menopausal  Other Topics Concern  . Not on file  Social History Narrative  . Not on file   Social Determinants of Health   Financial Resource Strain: Low Risk   . Difficulty of Paying Living Expenses: Not very hard  Food Insecurity: No Food Insecurity  . Worried About Charity fundraiser in the Last Year: Never true  . Ran Out of Food in the Last Year: Never true  Transportation Needs: Unmet Transportation Needs  . Lack of Transportation (Medical): Yes  . Lack of Transportation (Non-Medical): Yes  Physical Activity: Sufficiently Active  . Days of Exercise per Week: 7 days  . Minutes of Exercise per Session: 30 min  Stress: No Stress Concern Present  . Feeling of Stress : Only a little  Social Connections: Moderately Isolated  .  Frequency of Communication with Friends and Family: More than three times a week  . Frequency of Social Gatherings with Friends and Family: Once a week  . Attends Religious Services: Never  . Active Member of Clubs or Organizations: No  . Attends Archivist Meetings: Never  . Marital Status: Married    Tobacco Counseling Ready to quit: Not Answered Counseling given: Not Answered   Clinical Intake:  Pre-visit preparation completed: Yes  Pain : No/denies pain     BMI - recorded: 30.6 Nutritional Status: BMI > 30  Obese Nutritional Risks: None Diabetes: Yes CBG done?: No Did pt. bring in CBG monitor from home?: No  How often do you need to have someone help you when you read instructions, pamphlets, or other written materials from your doctor or pharmacy?: 1 - Never Nutrition Risk Assessment:  Has the patient had any N/V/D within the last 2 months?  No  Does the patient have any non-healing wounds?  No  Has the patient had any unintentional weight loss or weight gain?  No   Diabetes:  Is the patient diabetic?  Yes   If diabetic, was a CBG obtained today?  No  Did the patient bring in their glucometer from home?  No  How often do you monitor your CBG's? Pt does not actively check blood sugar.   Financial Strains and Diabetes Management:  Are you having any financial strains with the device, your supplies or your medication? No .  Does the patient want to be seen by Chronic Care Management for management of their diabetes?  Yes - referral sent today Would the patient like to be referred to a Nutritionist or for Diabetic Management?  No   Diabetic Exams:  Diabetic Eye Exam: Overdue for diabetic eye exam. Pt has been advised about the importance in completing this exam.   Diabetic Foot Exam: Completed 04/21/20.    Interpreter Needed?: No  Information entered by :: Clemetine Marker LPN   Activities of Daily Living In your present state of health, do you have any difficulty performing the following activities: 06/29/2020 04/21/2020  Hearing? N N  Comment declines hearing aids -  Vision? N N  Difficulty concentrating or making decisions? N N  Walking or climbing stairs? N N  Dressing or bathing? N N  Doing errands, shopping? N N  Preparing Food and eating ? N -  Using the Toilet? N -  In the past six months, have you accidently leaked urine? Y -  Comment wears pads for protection -  Do you have problems with loss of bowel control? N -  Managing your Medications? N -  Managing your Finances? N -  Housekeeping or managing your Housekeeping? N -  Some recent data might be hidden    Patient Care Team: Steele Sizer, MD as PCP - General (Family Medicine) Sharmaine Base, MD as Referring Physician (Psychiatry) Bjorn Loser, MD as Consulting Physician (Urology) Benedetto Goad, RN as Registered Nurse  Indicate any recent Medical Services you may have received from other than [facility] providers in the past year (date may be approximate).     Assessment:   This is a routine wellness examination  for [place].  Hearing/Vision screen  Hearing Screening   _0  _1  _2  _3  _4  _5  _6  _7  _8   Right ear:           Left ear:           Comments: Pt states hearing improved with use  of flonase due to build up of fluid from allergies, doing well now  Vision Screening Comments: Pt past due for eye exam, [facility], plans to schedule appt once other visits taken care of.   Dietary issues and exercise activities discussed: Current Exercise Habits: Home exercise routine, Type of exercise: walking, Time (Minutes): 30, Frequency (Times/Week): 7, Weekly Exercise (Minutes/Week): 210, Intensity: Moderate, Exercise limited by: None identified  Goals    . Increase physical activity     Pt would like to increase physical activity by starting to walk at least 3 days per week       Depression Screen PHQ 2/9 Scores 06/29/2020 04/21/2020 09/24/2019 06/24/2019 02/04/2019 01/28/2019 01/10/2019  PHQ - 2 Score 0 0 _0 PHQ- 9 Score - _1 Fall Risk Fall Risk  06/29/2020 04/21/2020 09/24/2019 06/24/2019 02/04/2019  Falls in the past year? 0 0 0 0 -  Number falls in past yr: 0 0 0 0 0  Injury with Fall? 0 0 0 0 0  Risk for fall due to : No Fall Risks - - - -  Follow up Falls prevention discussed - - Falls evaluation completed -    Any stairs in or around the home? Yes  If so, are there any without handrails? Yes  Home free of loose throw rugs in walkways, pet beds, electrical cords, etc? Yes  Adequate lighting in your home to reduce risk of falls? Yes   ASSISTIVE DEVICES UTILIZED TO PREVENT FALLS:  Life alert? No  Use of a cane, walker or w/c? No  Grab bars in the bathroom? No  Shower chair or bench in shower? No  Elevated toilet seat or a handicapped toilet? No   TIMED UP AND GO:  Was the test performed? Yes .  Length of time to ambulate 10 feet: 5 sec.   Gait steady and fast without use of assistive device  Cognitive  Function:     6CIT Screen 06/29/2020 02/04/2019  What Year? 0 points 0 points  What month? 0 points 0 points  What time? 0 points 0 points  Count back from 20 0 points 0 points  Months in reverse 0 points 0 points  Repeat phrase 0 points 0 points  Total Score 0 0    Immunizations Immunization History  Administered Date(s) Administered  . Influenza,inj,Quad PF,6+ Mos 09/02/2018, 09/24/2019  . PPD Test 05/10/2010  . Pneumococcal Polysaccharide-23 05/14/2018  . Tdap 06/30/2015    TDAP status: Up to date   Flu Vaccine status: Up to date   Pneumococcal vaccine status: Up to date   Covid-19 vaccine status: Information provided on how to obtain vaccines.   Qualifies for Shingles Vaccine? No  due at age 49  Screening Tests Health Maintenance  Topic Date Due  . Hepatitis C Screening  Never done  . OPHTHALMOLOGY EXAM  Never done  . COVID-19 Vaccine (1) Never done  . MAMMOGRAM  Never done  . INFLUENZA VACCINE  07/25/2020  . PAP SMEAR-Modifier  09/02/2020  . HEMOGLOBIN A1C  10/21/2020  . FOOT EXAM  04/21/2021  . TETANUS/TDAP  06/29/2025  . PNEUMOCOCCAL POLYSACCHARIDE VACCINE AGE 2-64 HIGH RISK  Completed  . HIV Screening  Completed    Health Maintenance  Health Maintenance Due  Topic Date Due  . Hepatitis C Screening  Never done  . OPHTHALMOLOGY EXAM  Never done  . COVID-19 Vaccine (1) Never  done  . MAMMOGRAM  Never done    Colorectal cancer screening: Referral to GI placed 04/21/20. Pt aware the office will call re: appt.  Pt notified that GI mailed letter on 04/28/20; updated patient's phone number in chart and will contact GI to reopen referral.   Mammogram status: Ordered 03/30/20. Pt provided with contact info and advised to call to schedule appt.    Lung Cancer Screening: (Low Dose CT Chest recommended if Age 82-80 years, 30 pack-year currently smoking OR have quit w/in 15years.) does not qualify. Pt would qualify due to smoking hx but does not qualify due to age.    Additional Screening:  Hepatitis C Screening: does qualify; postponed  Vision Screening: Recommended annual ophthalmology exams for early detection of glaucoma and other disorders of the eye. Is the patient up to date with their annual eye exam?  No  Who is the provider or what is the name of the office in which the patient attends annual eye exams? Not established If pt is not established with a provider, would they like to be referred to a provider to establish care? No .   Dental Screening: Recommended annual dental exams for proper oral hygiene  Community Resource Referral / Chronic Care Management: CRR required this visit?  Yes   CCM required this visit?  No     Plan:     I have personally reviewed and noted the following in the patient's chart:   . Medical and social history . Use of alcohol, tobacco or illicit drugs  . Current medications and supplements . Functional ability and status . Nutritional status . Physical activity . Advanced directives . List of other physicians . Hospitalizations, surgeries, and ER visits in previous 12 months . Vitals . Screenings to include cognitive, depression, and falls . Referrals and appointments  In addition, I have reviewed and discussed with patient certain preventive protocols, quality metrics, and best practice recommendations. A written personalized care plan for preventive services as well as general preventive health recommendations were provided to patient.     Clemetine Marker, LPN   07/26/600   Nurse Notes: pt states she would like to follow up with gastro for screening colonoscopy; staff message sent to Ulyess Blossom CMA at [facility]  to contact patient for schedule due to referral closed and pt did not receive letter sent on 04/28/20. Patient also wanting to follow up with podiatry and pulmonology but c/o copays being high for specialist. Referral sent to CCM for care coordination and possible copay assistance. Pt  appreciative of visit today.

## 2020-06-29 NOTE — Chronic Care Management (AMB) (Signed)
  Chronic Care Management   Outreach Note  06/29/2020 Name: April Luna MRN: [id number] DOB: 1971/08/09  April Luna is a 49 y.o. year old female who is a primary care patient of Steele Sizer, MD. I reached out to [employer] by phone today in response to a referral sent by April Luna PCP, Steele Sizer, MD.      An unsuccessful telephone outreach was attempted today. The patient was referred to the case management team for assistance with care management and care coordination.   Follow Up Plan: A HIPPA compliant phone message was left for the patient providing contact information and requesting a return call. The care management team will reach out to the patient again over the next 7 days. If patient returns call to provider office, please advise to call [place] at [phone].  [place], [place] [address] Direct Dial: [phone] [email or web address]: [email or web address]

## 2020-06-29 NOTE — Patient Instructions (Signed)
April Luna , Thank you for taking time to come for your Medicare Wellness Visit. I appreciate your ongoing commitment to your health goals. Please review the following plan we discussed and let me know if I can assist you in the future.   Screening recommendations/referrals: Colonoscopy: Referral sent to [facility] on 04/21/20. You may also contact them at [phone] Mammogram: Ordered 03/30/20. Please call [phone] to schedule your mammogram.   Recommended yearly ophthalmology/optometry visit for glaucoma screening and checkup Recommended yearly dental visit for hygiene and checkup  Vaccinations: Influenza vaccine: done 09/24/19 Pneumococcal vaccine: done 05/14/18 Tdap vaccine: done 06/30/15 Shingles vaccine: due at age 89  Covid-19: You may schedule your Covid-19 vaccine by contacting your local pharmacy OR calling:   [facility] COVID vaccine line [phone]  [facility] COVID vaccine line [phone]  Advanced directives: Advance directive discussed with you today. Even though you declined this today please call our office should you change your mind and we can give you the proper paperwork for you to fill out.  Conditions/risks identified: If you wish to quit smoking, help is available. For free tobacco cessation program offerings call the [facility] at [phone] or Live Well Line at [phone]. You may also visit [email or web address] or email [email or web address] for more information on other programs.   Next appointment: Follow up in one year for your annual wellness visit.   Preventive Care 40-64 Years, Female Preventive care refers to lifestyle choices and visits with your health care provider that can promote health and wellness. What does preventive care include?  A yearly physical exam. This is also called an annual well check.  Dental exams once or twice a year.  Routine eye exams. Ask your health care provider how often you should  have your eyes checked.  Personal lifestyle choices, including:  Daily care of your teeth and gums.  Regular physical activity.  Eating a healthy diet.  Avoiding tobacco and drug use.  Limiting alcohol use.  Practicing safe sex.  Taking low-dose aspirin daily starting at age 26.  Taking vitamin and mineral supplements as recommended by your health care provider. What happens during an annual well check? The services and screenings done by your health care provider during your annual well check will depend on your age, overall health, lifestyle risk factors, and family history of disease. Counseling  Your health care provider may ask you questions about your:  Alcohol use.  Tobacco use.  Drug use.  Emotional well-being.  Home and relationship well-being.  Sexual activity.  Eating habits.  Work and work Statistician.  Method of birth control.  Menstrual cycle.  Pregnancy history. Screening  You may have the following tests or measurements:  Height, weight, and BMI.  Blood pressure.  Lipid and cholesterol levels. These may be checked every 5 years, or more frequently if you are over 39 years old.  Skin check.  Lung cancer screening. You may have this screening every year starting at age 55 if you have a 30-pack-year history of smoking and currently smoke or have quit within the past 15 years.  Fecal occult blood test (FOBT) of the stool. You may have this test every year starting at age 75.  Flexible sigmoidoscopy or colonoscopy. You may have a sigmoidoscopy every 5 years or a colonoscopy every 10 years starting at age 28.  Hepatitis C blood test.  Hepatitis B blood test.  Sexually transmitted disease (STD) testing.  Diabetes screening. This is done by checking your blood  sugar (glucose) after you have not eaten for a while (fasting). You may have this done every 1-3 years.  Mammogram. This may be done every 1-2 years. Talk to your health care  provider about when you should start having regular mammograms. This may depend on whether you have a family history of breast cancer.  BRCA-related cancer screening. This may be done if you have a family history of breast, ovarian, tubal, or peritoneal cancers.  Pelvic exam and Pap test. This may be done every 3 years starting at age 10. Starting at age 52, this may be done every 5 years if you have a Pap test in combination with an HPV test.  Bone density scan. This is done to screen for osteoporosis. You may have this scan if you are at high risk for osteoporosis. Discuss your test results, treatment options, and if necessary, the need for more tests with your health care provider. Vaccines  Your health care provider may recommend certain vaccines, such as:  Influenza vaccine. This is recommended every year.  Tetanus, diphtheria, and acellular pertussis (Tdap, Td) vaccine. You may need a Td booster every 10 years.  Zoster vaccine. You may need this after age 52.  Pneumococcal 13-valent conjugate (PCV13) vaccine. You may need this if you have certain conditions and were not previously vaccinated.  Pneumococcal polysaccharide (PPSV23) vaccine. You may need one or two doses if you smoke cigarettes or if you have certain conditions. Talk to your health care provider about which screenings and vaccines you need and how often you need them. This information is not intended to replace advice given to you by your health care provider. Make sure you discuss any questions you have with your health care provider. Document Released: 01/07/2016 Document Revised: 08/30/2016 Document Reviewed: 10/12/2015 Elsevier Interactive Patient Education  2017 [place] Prevention in the Home Falls can cause injuries. They can happen to people of all ages. There are many things you can do to make your home safe and to help prevent falls. What can I do on the outside of my home?  Regularly fix the  edges of walkways and driveways and fix any cracks.  Remove anything that might make you trip as you walk through a door, such as a raised step or threshold.  Trim any bushes or trees on the path to your home.  Use bright outdoor lighting.  Clear any walking paths of anything that might make someone trip, such as rocks or tools.  Regularly check to see if handrails are loose or broken. Make sure that both sides of any steps have handrails.  Any raised decks and porches should have guardrails on the edges.  Have any leaves, snow, or ice cleared regularly.  Use sand or salt on walking paths during winter.  Clean up any spills in your garage right away. This includes oil or grease spills. What can I do in the bathroom?  Use night lights.  Install grab bars by the toilet and in the tub and shower. Do not use towel bars as grab bars.  Use non-skid mats or decals in the tub or shower.  If you need to sit down in the shower, use a plastic, non-slip stool.  Keep the floor dry. Clean up any water that spills on the floor as soon as it happens.  Remove soap buildup in the tub or shower regularly.  Attach bath mats securely with double-sided non-slip rug tape.  Do not have  throw rugs and other things on the floor that can make you trip. What can I do in the bedroom?  Use night lights.  Make sure that you have a light by your bed that is easy to reach.  Do not use any sheets or blankets that are too big for your bed. They should not hang down onto the floor.  Have a firm chair that has side arms. You can use this for support while you get dressed.  Do not have throw rugs and other things on the floor that can make you trip. What can I do in the kitchen?  Clean up any spills right away.  Avoid walking on wet floors.  Keep items that you use a lot in easy-to-reach places.  If you need to reach something above you, use a strong step stool that has a grab bar.  Keep  electrical cords out of the way.  Do not use floor polish or wax that makes floors slippery. If you must use wax, use non-skid floor wax.  Do not have throw rugs and other things on the floor that can make you trip. What can I do with my stairs?  Do not leave any items on the stairs.  Make sure that there are handrails on both sides of the stairs and use them. Fix handrails that are broken or loose. Make sure that handrails are as long as the stairways.  Check any carpeting to make sure that it is firmly attached to the stairs. Fix any carpet that is loose or worn.  Avoid having throw rugs at the top or bottom of the stairs. If you do have throw rugs, attach them to the floor with carpet tape.  Make sure that you have a light switch at the top of the stairs and the bottom of the stairs. If you do not have them, ask someone to add them for you. What else can I do to help prevent falls?  Wear shoes that:  Do not have high heels.  Have rubber bottoms.  Are comfortable and fit you well.  Are closed at the toe. Do not wear sandals.  If you use a stepladder:  Make sure that it is fully opened. Do not climb a closed stepladder.  Make sure that both sides of the stepladder are locked into place.  Ask someone to hold it for you, if possible.  Clearly mark and make sure that you can see:  Any grab bars or handrails.  First and last steps.  Where the edge of each step is.  Use tools that help you move around (mobility aids) if they are needed. These include:  Canes.  Walkers.  Scooters.  Crutches.  Turn on the lights when you go into a dark area. Replace any light bulbs as soon as they burn out.  Set up your furniture so you have a clear path. Avoid moving your furniture around.  If any of your floors are uneven, fix them.  If there are any pets around you, be aware of where they are.  Review your medicines with your doctor. Some medicines can make you feel dizzy.  This can increase your chance of falling. Ask your doctor what other things that you can do to help prevent falls. This information is not intended to replace advice given to you by your health care provider. Make sure you discuss any questions you have with your health care provider. Document Released: 10/07/2009 Document Revised: 05/18/2016 Document Reviewed:  01/15/2015 Elsevier Interactive Patient Education  2017 [employer].

## 2020-07-02 NOTE — Chronic Care Management (AMB) (Signed)
  Chronic Care Management   Outreach Note  07/02/2020 Name: [name] MRN: [id number] DOB: October 08, 1971  April Luna is a 49 y.o. year old female who is a primary care patient of Steele Sizer, MD. I reached out to [employer] by phone today in response to a referral sent by [name] Johansson's PCP, Steele Sizer, MD.     A second unsuccessful telephone outreach was attempted today. The patient was referred to the case management team for assistance with care management and care coordination.   Follow Up Plan: A HIPPA compliant phone message was left for the patient providing contact information and requesting a return call. The care management team will reach out to the patient again over the next 7 days. If patient returns call to provider office, please advise to call [facility] at [phone].  [place], [place] [address] Direct Dial: [phone] [email or web address]: [email or web address]

## 2020-07-09 NOTE — Chronic Care Management (AMB) (Signed)
  Chronic Care Management   Note  07/09/2020 Name: April Luna MRN: [id number] DOB: 09/07/1971  April Luna is a 49 y.o. year old female who is a primary care patient of Steele Sizer, MD. I reached out to [employer] by phone today in response to a referral sent by [name] PCP, Steele Sizer, MD.     [name] was given information about Chronic Care Management services today including:  1. CCM service includes personalized support from designated clinical staff supervised by her physician, including individualized plan of care and coordination with other care providers 2. 24/7 contact phone numbers for assistance for urgent and routine care needs. 3. Service will only be billed when office clinical staff spend 20 minutes or more in a month to coordinate care. 4. Only one practitioner may furnish and bill the service in a calendar month. 5. The patient may stop CCM services at any time (effective at the end of the month) by phone call to the office staff. 6. The patient will be responsible for cost sharing (co-pay) of up to 20% of the service fee (after annual deductible is met).  Patient agreed to services and verbal consent obtained.   Follow up plan: Telephone appointment with care management team member scheduled for: 07/28/2020  [place], [place], [place] [address] Direct Dial: [email or web address] Website: [email or web address]

## 2020-07-12 ENCOUNTER — Ambulatory Visit: Payer: Medicare Other | Admitting: Urology

## 2020-07-14 ENCOUNTER — Ambulatory Visit: Payer: Medicare Other | Admitting: Urology

## 2020-07-19 ENCOUNTER — Other Ambulatory Visit: Payer: Self-pay | Admitting: Family Medicine

## 2020-07-19 DIAGNOSIS — E032 Hypothyroidism due to medicaments and other exogenous substances: Secondary | ICD-10-CM

## 2020-07-20 ENCOUNTER — Other Ambulatory Visit: Payer: Self-pay | Admitting: Family Medicine

## 2020-07-20 DIAGNOSIS — K5909 Other constipation: Secondary | ICD-10-CM

## 2020-07-28 ENCOUNTER — Telehealth: Payer: Medicare Other

## 2020-07-28 ENCOUNTER — Telehealth: Payer: Self-pay

## 2020-07-28 NOTE — Telephone Encounter (Signed)
  Chronic Care Management   Outreach Note  07/28/2020 Name: April Luna MRN: [id number] DOB: 02/21/1971  Primary Care Provider: Steele Sizer, MD Reason for referral : Chronic Care Management    An unsuccessful telephone outreach was attempted today. April Luna was referred to the case management team for assistance with care management and care coordination.   A HIPAA compliant voice message was left today requesting a return call.   PLAN The care management team will reach out to April Luna again within the next two weeks.   [facility] Care Management [phone]

## 2020-08-06 ENCOUNTER — Other Ambulatory Visit: Payer: Self-pay | Admitting: Family Medicine

## 2020-08-06 DIAGNOSIS — E032 Hypothyroidism due to medicaments and other exogenous substances: Secondary | ICD-10-CM

## 2020-09-01 ENCOUNTER — Telehealth: Payer: Self-pay

## 2020-09-01 NOTE — Telephone Encounter (Signed)
°  Chronic Care Management   Outreach Note  09/01/2020 Name: April Luna MRN: [id number] DOB: Apr 28, 1971  Primary Care Provider: Steele Sizer, MD Reason for referral : Chronic Care Management   An unsuccessful telephone outreach was attempted today. [name] was referred to the case management team for assistance with care management and care coordination.   A HIPAA compliant voice message was left today requesting a return call.    PLAN:  The care management team will reach out to [name] again within the next two to three weeks.    [facility] Care [facility] [phone]

## 2020-09-01 NOTE — Telephone Encounter (Signed)
Error. Please disregard

## 2020-09-13 ENCOUNTER — Other Ambulatory Visit: Payer: Self-pay | Admitting: Family Medicine

## 2020-09-13 DIAGNOSIS — E032 Hypothyroidism due to medicaments and other exogenous substances: Secondary | ICD-10-CM

## 2020-09-15 ENCOUNTER — Telehealth: Payer: Self-pay

## 2020-09-15 NOTE — Telephone Encounter (Cosign Needed)
  Chronic Care Management   Outreach Note  09/15/2020 Name: April Luna MRN: [id number] DOB: Dec 12, 1971  Primary Care Provider: Steele Sizer, MD Reason for referral : Chronic Care Management    [name] was referred to the care management team for assistance with chronic care management and care coordination. Her primary care provider will be notified of our unsuccessful attempts to maintain contact. The care management team will gladly outreach at any time in the future if she is interested in receiving assistance.   PLAN The care management team will gladly follow up with [name] after the primary care provider has a conversation with her regarding recommendation for care management engagement and subsequent re-referral for care management services.    [facility] Care [facility] [phone]

## 2020-09-17 ENCOUNTER — Ambulatory Visit: Payer: Self-pay

## 2020-09-17 NOTE — Chronic Care Management (AMB) (Signed)
  Chronic Care Management   Note  09/17/2020 Name: April Luna MRN: [id number] DOB: 10-03-1971   Attempted outreach with [name] today. Reports preparing for an appointment with her son. Prefers to complete care management outreach next week. Also reports she and her son were recently diagnosed with Covid-19. She was unable to follow-up with her provider this week but agreeable to virtual outreach one day next week.    Follow up plan: [name] is scheduled for virtual outreach with Dr. Ancil Boozer on 09/22/20. She will complete outreach with the care management team on 09/24/20.    [facility] Care [facility] [phone]

## 2020-09-22 ENCOUNTER — Other Ambulatory Visit: Payer: Self-pay

## 2020-09-22 NOTE — Progress Notes (Deleted)
Name: April Luna   MRN: [id number]    DOB: 03/02/1971   Date:09/22/2020       Progress Note  Subjective  Chief Complaint  No chief complaint on file.   I connected with  April Luna  on 09/22/20 at 11:00 AM EDT by a video enabled telemedicine application and verified that I am speaking with the correct person using two identifiers.  I discussed the limitations of evaluation and management by telemedicine and the availability of in person appointments. The patient expressed understanding and agreed to proceed. Staff also discussed with the patient that there may be a patient responsible charge related to this service. Patient Location: *** Provider Location: *** Additional Individuals present: ***  HPI  *** Patient Active Problem List   Diagnosis Date Noted  . Plantar callus 10/06/2019  . Acquired hallux limitus of both feet 10/06/2019  . Morbid obesity ([place]) 06/24/2019  . Type 2 diabetes mellitus with microalbuminuria, without long-term current use of insulin ([place]) 06/24/2019  . Chronic obstructive pulmonary disease ([place]) 06/24/2019  . Chronic constipation 06/24/2019  . ASCUS of cervix with negative high risk HPV 09/05/2018  . GERD without esophagitis 04/11/2018  . Hyperlipidemia 04/11/2018  . Hypertension 04/11/2018  . Hypothyroidism 04/26/2015  . Bipolar I disorder, most recent episode (or current) manic ([place]) 04/25/2015  . Delirium due to another medical condition 04/25/2015  . Cannabis abuse 04/25/2015    Past Surgical History:  Procedure Laterality Date  . CESAREAN SECTION    . CYSTOSCOPY W/ RETROGRADES Left 11/08/2018   Procedure: CYSTOSCOPY WITH RETROGRADE PYELOGRAM;  Surgeon: Billey Co, MD;  Location: [facility] OR[facility];  Service: Urology;  Laterality: Left;  . CYSTOSCOPY/URETEROSCOPY/HOLMIUM LASER/STENT PLACEMENT Left 11/08/2018   Procedure: CYSTOSCOPY/URETEROSCOPY/HOLMIUM LASER/STENT PLACEMENT;  Surgeon: Billey Co, MD;  Location: [facility];   Service: Urology;  Laterality: Left;  . MOUTH SURGERY     wisdom teeth extraction    Family History  Problem Relation Age of Onset  . Depression Mother   . Anxiety disorder Mother   . Diabetes Mother   . Hypertension Mother   . Hyperlipidemia Mother   . Cancer Mother   . Cancer Father        colon cancer  . Depression Brother   . Anxiety disorder Brother   . Diabetes Mellitus II Maternal Grandmother   . Hypercholesterolemia Maternal Grandmother   . Cancer Paternal Grandmother   . Diabetes Paternal Grandmother     Social History   Socioeconomic History  . Marital status: Married    Spouse name: Montine Circle  . Number of children: 1  . Years of education: 97  . Highest education level: High school graduate  Occupational History  . Occupation: unemployed    Comment: disabled  Tobacco Use  . Smoking status: Current Every Day Smoker    Packs/day: 1.00    Years: 34.00    Pack years: 34.00    Types: Cigarettes    Start date: 05/13/1985  . Smokeless tobacco: Never Used  Vaping Use  . Vaping Use: Former  . Start date: 05/25/2018  . Quit date: 09/24/2018  Substance and Sexual Activity  . Alcohol use: Not Currently    Alcohol/week: 0.0 standard drinks    Comment: rarely  . Drug use: Not Currently    Types: Marijuana    Comment: none since August 2019  . Sexual activity: Not Currently    Birth control/protection: Post-menopausal  Other Topics Concern  . Not on  file  Social History Narrative  . Not on file   Social Determinants of Health   Financial Resource Strain: Low Risk   . Difficulty of Paying Living Expenses: Not very hard  Food Insecurity: No Food Insecurity  . Worried About Charity fundraiser in the Last Year: Never true  . Ran Out of Food in the Last Year: Never true  Transportation Needs: Unmet Transportation Needs  . Lack of Transportation (Medical): Yes  . Lack of Transportation (Non-Medical): Yes  Physical Activity: Sufficiently Active  . Days  of Exercise per Week: 7 days  . Minutes of Exercise per Session: 30 min  Stress: No Stress Concern Present  . Feeling of Stress : Only a little  Social Connections: Moderately Isolated  . Frequency of Communication with Friends and Family: More than three times a week  . Frequency of Social Gatherings with Friends and Family: Once a week  . Attends Religious Services: Never  . Active Member of Clubs or Organizations: No  . Attends Archivist Meetings: Never  . Marital Status: Married  Human resources officer Violence: Not At Risk  . Fear of Current or Ex-Partner: No  . Emotionally Abused: No  . Physically Abused: No  . Sexually Abused: No     Current Outpatient Medications:  .  albuterol (PROVENTIL) (2.5 MG/3ML) 0.083% nebulizer solution, Take 3 mLs (2.5 mg total) by nebulization every 6 (six) hours as needed for wheezing or shortness of breath., Disp: 150 mL, Rfl: 1 .  amantadine (SYMMETREL) 100 MG capsule, Take 100 mg by mouth 2 (two) times daily., Disp: , Rfl:  .  blood glucose meter kit and supplies, Dispense based on patient and insurance preference. Use up to four times daily as directed. (FOR ICD-10 E10.9, E11.9)., Disp: 1 each, Rfl: 0 .  buPROPion (WELLBUTRIN XL) 150 MG 24 hr tablet, Take 150 mg by mouth daily., Disp: , Rfl:  .  Cholecalciferol (VITAMIN D-3) 125 MCG (5000 UT) TABS, Take 5,000 Units by mouth daily., Disp: 30 tablet, Rfl: 1 .  Fluticasone-Umeclidin-Vilant (TRELEGY ELLIPTA) 100-62.5-25 MCG/INH AEPB, Inhale 1 puff into the lungs daily., Disp: 60 each, Rfl: 6 .  gabapentin (NEURONTIN) 300 MG capsule, Take 300 mg by mouth 2 (two) times daily., Disp: , Rfl:  .  hydrOXYzine (VISTARIL) 50 MG capsule, Take 50 mg by mouth 2 (two) times daily as needed for anxiety., Disp: , Rfl:  .  icosapent Ethyl (VASCEPA) 1 g capsule, Take 2 capsules (2 g total) by mouth 2 (two) times daily., Disp: 120 capsule, Rfl: 5 .  INVEGA SUSTENNA 234 MG/1.5ML SUSY injection, Inject into the  muscle. Inject as directed once a month, Disp: , Rfl:  .  levothyroxine (SYNTHROID) 75 MCG tablet, TAKE 1 TABLET BY MOUTH DAILY AT 6 AM, Disp: 90 tablet, Rfl: 0 .  losartan (COZAAR) 50 MG tablet, Take 1 tablet (50 mg total) by mouth daily., Disp: 90 tablet, Rfl: 1 .  lubiprostone (AMITIZA) 24 MCG capsule, TAKE 1 CAPSULE BY MOUTH 2 TIMES DAILY WITH A MEAL, Disp: 180 capsule, Rfl: 0 .  nicotine (NICOTROL) 10 MG inhaler, Inhale 1 Cartridge (1 continuous puffing total) into the lungs as needed for smoking cessation. (Patient not taking: Reported on 06/29/2020), Disp: 36 each, Rfl: 3 .  oxybutynin (DITROPAN-XL) 10 MG 24 hr tablet, TAKE 1 TABLET BY MOUTH EACH DAY, Disp: 90 tablet, Rfl: 3 .  pantoprazole (PROTONIX) 40 MG tablet, TAKE 1 TABLET BY MOUTH DAILY AT 6 AM, Disp: 90  tablet, Rfl: 1 .  prazosin (MINIPRESS) 2 MG capsule, Take 2 mg by mouth at bedtime., Disp: , Rfl:  .  rosuvastatin (CRESTOR) 40 MG tablet, Take 1 tablet (40 mg total) by mouth daily., Disp: 30 tablet, Rfl: 5 .  sertraline (ZOLOFT) 100 MG tablet, Take 200 mg by mouth daily., Disp: , Rfl:   Allergies  Allergen Reactions  . Metformin And Related Nausea And Vomiting  . Penicillins Other (See Comments)    She has taken amoxicillin without problems  . Perphenazine Other (See Comments)    Tremors, muscle weakness, tongue swelling  . Sulfa Antibiotics Other (See Comments)    GI distress   . Abilify [Aripiprazole] Rash    I personally reviewed {Reviewed:14835} with the patient/caregiver today.   ROS ***  Objective  Virtual encounter, vitals not obtained.  There is no height or weight on file to calculate BMI.  Physical Exam ***  No results found for this or any previous visit (from the past 72 hour(s)).  PHQ2/9: Depression screen [facility] 2/9 06/29/2020 04/21/2020 09/24/2019 06/24/2019 02/04/2019  Decreased Interest 0 0 '1 3 1  ' Down, Depressed, Hopeless 0 0 0 1 1  PHQ - 2 Score 0 0 '1 4 2  ' Altered sleeping - 0 0 0 0  Tired,  decreased energy - '2 1 1 3  ' Change in appetite - 2 1 0 2  Feeling bad or failure about yourself  - 0 0 0 2  Trouble concentrating - '3 2 2 2  ' Moving slowly or fidgety/restless - 1 0 0 2  Suicidal thoughts - 0 0 0 0  PHQ-9 Score - '8 5 7 13  ' Difficult doing work/chores - Somewhat difficult Somewhat difficult Somewhat difficult Somewhat difficult  Some recent data might be hidden   PHQ-2/9 Result is {gen negative/positive:315881}.    Fall Risk: Fall Risk  06/29/2020 04/21/2020 09/24/2019 06/24/2019 02/04/2019  Falls in the past year? 0 0 0 0 -  Number falls in past yr: 0 0 0 0 0  Injury with Fall? 0 0 0 0 0  Risk for fall due to : No Fall Risks - - - -  Follow up Falls prevention discussed - - Falls evaluation completed -   ***  Assessment & Plan  There are no diagnoses linked to this encounter.  I discussed the assessment and treatment plan with the patient. The patient was provided an opportunity to ask questions and all were answered. The patient agreed with the plan and demonstrated an understanding of the instructions.  The patient was advised to call back or seek an in-person evaluation if the symptoms worsen or if the condition fails to improve as anticipated.  I provided *** minutes of non-face-to-face time during this encounter.

## 2020-09-24 ENCOUNTER — Telehealth: Payer: Medicare Other

## 2020-09-24 ENCOUNTER — Telehealth: Payer: Self-pay

## 2020-09-24 NOTE — Telephone Encounter (Signed)
°  Chronic Care Management   Outreach Note  09/24/2020 Name: April Luna MRN: [id number] DOB: 20-Nov-1971   Primary Care Provider: Steele Sizer, MD Reason for referral : Chronic Care Management   An unsuccessful telephone outreach was attempted today. [name] was referred to the case management team for assistance with care management and care coordination.     PLAN A HIPAA compliant voice message was left requesting a return call.    Cristy [facility] Care Management [facility] [phone]

## 2020-10-04 ENCOUNTER — Telehealth: Payer: Self-pay

## 2020-10-04 NOTE — Telephone Encounter (Signed)
°  Chronic Care Management   Outreach Note  10/04/2020 Name: April Luna MRN: [id number] DOB: 1971/02/28  Primary Care Provider: Steele Sizer, MD Reason for referral : Chronic Care Management   An unsuccessful telephone outreach was attempted today. [name] was referred to the case management team for assistance with care management and care coordination.     PLAN Unable to leave a voice message at [phone]. The other listed number [phone] is currently not a working number. Following the initial referral, the Care Guide noted difficulty with reaching her d/t temporary cell phone numbers. Will reach out to the Care Guide team to determine if [name] provided additional contact numbers. Will request that she be scheduled for outreach if she is still interested in services.     Cristy [facility] Care Management [facility] [phone]

## 2020-10-11 ENCOUNTER — Other Ambulatory Visit: Payer: Self-pay | Admitting: Family Medicine

## 2020-10-11 DIAGNOSIS — E785 Hyperlipidemia, unspecified: Secondary | ICD-10-CM

## 2020-10-11 DIAGNOSIS — E1169 Type 2 diabetes mellitus with other specified complication: Secondary | ICD-10-CM

## 2020-10-11 DIAGNOSIS — K5909 Other constipation: Secondary | ICD-10-CM

## 2020-10-11 NOTE — Telephone Encounter (Signed)
Requested medication (s) are due for refill today: yes  Requested medication (s) are on the active medication list: yes  Last refill: 09/13/2020  Future visit scheduled:yes  Notes to clinic:  Medication not assigned to a protocol, review manually   Requested Prescriptions  Pending Prescriptions Disp Refills   VASCEPA 1 g capsule [Pharmacy Med Name: VASCEPA 1 GM CAP] 120 capsule 5    Sig: TAKE 2 CAPSULES BY MOUTH TWICE A DAY      Off-Protocol Failed - 10/11/2020  2:56 PM      Failed - Medication not assigned to a protocol, review manually.      Passed - Valid encounter within last 12 months    Recent Outpatient Visits           2 weeks ago Erroneous encounter - disregard   [place], [place]   4 months ago No-show for appointment   [facility] Steele Sizer, MD   5 months ago Type 2 diabetes mellitus with microalbuminuria, without long-term current use of insulin [facility])   [facility], Drue Stager, MD   1 year ago Type 2 diabetes mellitus with microalbuminuria, without long-term current use of insulin [facility])   [facility] [place], Drue Stager, MD   1 year ago Type 2 diabetes mellitus with microalbuminuria, without long-term current use of insulin [facility])   [place], FNP       Future Appointments             In 3 weeks Steele Sizer, MD [facility] [place]   In 8 months  [facility], [facility]

## 2020-10-12 ENCOUNTER — Telehealth: Payer: Self-pay | Admitting: *Deleted

## 2020-10-12 ENCOUNTER — Ambulatory Visit: Payer: Medicare Other | Admitting: Family Medicine

## 2020-10-12 NOTE — Progress Notes (Signed)
Name: April Luna   MRN: [id number]    DOB: [birth year]   Date:10/13/2020       Progress Note  Subjective  Chief Complaint  Chief Complaint  Patient presents with  . Shortness of Breath  . Cough  . Fatigue  . Flank Pain  . Adenopathy    I connected with  April Luna on 10/13/20 at  1:20 PM EDT by telephone and verified that I am speaking with the correct person using two identifiers.  I discussed the limitations, risks, security and privacy concerns of performing an evaluation and management service by telephone and the availability of in person appointments. The patient expressed understanding and agreed to proceed. Staff also discussed with the patient that there may be a patient responsible charge related to this service. Patient Location: Home Provider Location: [facility] Additional Individuals present: none  HPI  Patient is a 49 year old female patient of Dr. Ancil Boozer His last visit with her was in April 2021.  A no-show was noted in June. Follows up today with complaints of cough, fatigue, shortness of breath, swollen nodes, and some left-sided pain Of note, his son was diagnosed with mono recently, after had Covid  Sx's started shortly after had Covid, end of quarantine for her son and her was Sept 1st.  Large painful lymph node in neck, feels like a boil.   Has dental problems (teeth hurt and saw dentist and cost 4k to get teeth done and not have money, said have to get all teeth out) Pain behind ribcage on left around abdomen,no blood in urine, not constant pain, is intermittent, noted discomfort with urination at times, no blood Had diverticulitis before, and kidney stones before.  + cough, + production - no color to phlegm, no blood, has been two months now, lungs tight and takes trelegy daily, albuterol not very often + SOB at rest and when exerts self since Covid No fever, gets sweats  + sore throat, "a little twingy and not hurt right now" + mild  congestion in sinuses No loss of smell, loss of taste since Covid when did  No N/V + muscle aches, feels fatigued No marked loose stools/diarrhea No CP, passing out episodes, occas dizzy, trying to stay hydrated  + tob use Comorbid conditions reviewed + COPD hx,  + DM - blood sugar good in recent past.  + HTN,  + morbid obesity  Not have a car presently. She does not feel sx's need to go to ER.   Patient Active Problem List   Diagnosis Date Noted  . Plantar callus 10/06/2019  . Acquired hallux limitus of both feet 10/06/2019  . Morbid obesity ([place]) 06/24/2019  . Type 2 diabetes mellitus with microalbuminuria, without long-term current use of insulin ([place]) 06/24/2019  . Chronic obstructive pulmonary disease ([place]) 06/24/2019  . Chronic constipation 06/24/2019  . ASCUS of cervix with negative high risk HPV 09/05/2018  . GERD without esophagitis 04/11/2018  . Hyperlipidemia 04/11/2018  . Hypertension 04/11/2018  . Hypothyroidism 04/26/2015  . Bipolar I disorder, most recent episode (or current) manic ([place]) 04/25/2015  . Delirium due to another medical condition 04/25/2015  . Cannabis abuse 04/25/2015    Past Surgical History:  Procedure Laterality Date  . CESAREAN SECTION    . CYSTOSCOPY W/ RETROGRADES Left 11/08/2018   Procedure: CYSTOSCOPY WITH RETROGRADE PYELOGRAM;  Surgeon: Billey Co, MD;  Location: [facility] OR[facility];  Service: Urology;  Laterality: Left;  . CYSTOSCOPY/URETEROSCOPY/HOLMIUM LASER/STENT PLACEMENT Left 11/08/2018  Procedure: CYSTOSCOPY/URETEROSCOPY/HOLMIUM LASER/STENT PLACEMENT;  Surgeon: Billey Co, MD;  Location: [facility];  Service: Urology;  Laterality: Left;  . MOUTH SURGERY     wisdom teeth extraction    Family History  Problem Relation Age of Onset  . Depression Mother   . Anxiety disorder Mother   . Diabetes Mother   . Hypertension Mother   . Hyperlipidemia Mother   . Cancer Mother   . Cancer Father        colon cancer  . Depression  Brother   . Anxiety disorder Brother   . Diabetes Mellitus II Maternal Grandmother   . Hypercholesterolemia Maternal Grandmother   . Cancer Paternal Grandmother   . Diabetes Paternal Grandmother     Social History   Tobacco Use  . Smoking status: Current Every Day Smoker    Packs/day: 1.00    Years: 34.00    Pack years: 34.00    Types: Cigarettes    Start date: 05/13/1985  . Smokeless tobacco: Never Used  Substance Use Topics  . Alcohol use: Not Currently    Alcohol/week: 0.0 standard drinks    Comment: rarely     Current Outpatient Medications:  .  albuterol (PROVENTIL) (2.5 MG/3ML) 0.083% nebulizer solution, Take 3 mLs (2.5 mg total) by nebulization every 6 (six) hours as needed for wheezing or shortness of breath., Disp: 150 mL, Rfl: 1 .  amantadine (SYMMETREL) 100 MG capsule, Take 100 mg by mouth 2 (two) times daily., Disp: , Rfl:  .  blood glucose meter kit and supplies, Dispense based on patient and insurance preference. Use up to four times daily as directed. (FOR ICD-10 E10.9, E11.9)., Disp: 1 each, Rfl: 0 .  buPROPion (WELLBUTRIN XL) 150 MG 24 hr tablet, Take 150 mg by mouth daily., Disp: , Rfl:  .  Cholecalciferol (VITAMIN D-3) 125 MCG (5000 UT) TABS, Take 5,000 Units by mouth daily., Disp: 30 tablet, Rfl: 1 .  Fluticasone-Umeclidin-Vilant (TRELEGY ELLIPTA) 100-62.5-25 MCG/INH AEPB, Inhale 1 puff into the lungs daily., Disp: 60 each, Rfl: 6 .  gabapentin (NEURONTIN) 300 MG capsule, Take 300 mg by mouth 2 (two) times daily., Disp: , Rfl:  .  hydrOXYzine (VISTARIL) 50 MG capsule, Take 50 mg by mouth 2 (two) times daily as needed for anxiety., Disp: , Rfl:  .  INVEGA SUSTENNA 234 MG/1.5ML SUSY injection, Inject into the muscle. Inject as directed once a month, Disp: , Rfl:  .  levothyroxine (SYNTHROID) 75 MCG tablet, TAKE 1 TABLET BY MOUTH DAILY AT 6 AM, Disp: 90 tablet, Rfl: 0 .  losartan (COZAAR) 50 MG tablet, Take 1 tablet (50 mg total) by mouth daily., Disp: 90 tablet,  Rfl: 1 .  lubiprostone (AMITIZA) 24 MCG capsule, TAKE 1 CAPSULE BY MOUTH 2 TIMES DAILY WITH A MEAL, Disp: 180 capsule, Rfl: 0 .  nicotine (NICOTROL) 10 MG inhaler, Inhale 1 Cartridge (1 continuous puffing total) into the lungs as needed for smoking cessation., Disp: 36 each, Rfl: 3 .  oxybutynin (DITROPAN-XL) 10 MG 24 hr tablet, TAKE 1 TABLET BY MOUTH EACH DAY, Disp: 90 tablet, Rfl: 3 .  pantoprazole (PROTONIX) 40 MG tablet, TAKE 1 TABLET BY MOUTH DAILY AT 6 AM, Disp: 90 tablet, Rfl: 1 .  prazosin (MINIPRESS) 2 MG capsule, Take 2 mg by mouth at bedtime., Disp: , Rfl:  .  rosuvastatin (CRESTOR) 40 MG tablet, TAKE 1 TABLET BY MOUTH DAILY, Disp: 30 tablet, Rfl: 5 .  sertraline (ZOLOFT) 100 MG tablet, Take 200 mg by mouth  daily., Disp: , Rfl:  .  VASCEPA 1 g capsule, TAKE 2 CAPSULES BY MOUTH TWICE A DAY, Disp: 120 capsule, Rfl: 5  Allergies  Allergen Reactions  . Metformin And Related Nausea And Vomiting  . Penicillins Other (See Comments)    She has taken amoxicillin without problems  . Perphenazine Other (See Comments)    Tremors, muscle weakness, tongue swelling  . Sulfa Antibiotics Other (See Comments)    GI distress   . Abilify [Aripiprazole] Rash    With staff assistance, above reviewed with the patient today.  ROS: As per HPI, otherwise no specific complaints on a limited and focused system review   Objective  Virtual encounter, vitals not obtained.  There is no height or weight on file to calculate BMI.  Physical Exam   Appears in NAD via conversation Breathing: No obvious respiratory distress. Speaking in complete sentences Neurological: Pt is alert, Speech is normal Psychiatric: Patient has a normal mood and affect, behavior is normal. Judgment and thought content normal.   No results found for this or any previous visit (from the past 72 hour(s)).  PHQ2/9: Depression screen [facility] 2/9 10/13/2020 06/29/2020 04/21/2020 09/24/2019 06/24/2019  Decreased Interest 3 0 0 1 3    Down, Depressed, Hopeless 1 0 0 0 1  PHQ - 2 Score 4 0 0 1 4  Altered sleeping 1 - 0 0 0  Tired, decreased energy 3 - _0 Change in appetite 2 - 2 1 0  Feeling bad or failure about yourself  0 - 0 0 0  Trouble concentrating 2 - _1 Moving slowly or fidgety/restless 2 - 1 0 0  Suicidal thoughts 0 - 0 0 0  PHQ-9 Score 14 - _2 Difficult doing work/chores Very difficult - Somewhat difficult Somewhat difficult Somewhat difficult  Some recent data might be hidden   PHQ-2/9 Result reviewed, continues medications to treat, recent sx's contributing, receives psych med thru apothecary and Dr. Holley Raring prescribing and changing provider presently to [facility] and continuing to get care presently  Fall Risk: Fall Risk  10/13/2020 06/29/2020 04/21/2020 09/24/2019 06/24/2019  Falls in the past year? 0 0 0 0 0  Number falls in past yr: 0 0 0 0 0  Injury with Fall? 0 0 0 0 0  Risk for fall due to : - No Fall Risks - - -  Follow up - Falls prevention discussed - - Falls evaluation completed     Assessment & Plan  Noted to patient at length the marked limitations with this being a phone visit, and with her multiple complaints, the challenges trying to diagnose, treat and manage these presently.  1. Viral syndrome Discussed with the patient the concerns for a possible infectious source to her upper respiratory infectious symptoms and include cough, some mucus production, feeling feverish with sweats, and she had Covid previously, with her quarantine ending in very early September, and then symptoms recurring.  Her son has recently been diagnosed with mono after he had Covid.  Concerned with her COPD history and some shortness of breath and tobacco dependence for a COPD exacerbation.  Felt best to manage it as such, and doxycycline-100 mg twice daily prescribed.  Avoiding azithromycin due to her medications for her mental health and potential cardiac risk. She has a steroid in her Trelegy, and we  will hold on any oral steroids, especially noting the other issues above, and also can worsen her diabetes noted.  She can continue the albuterol inhaler as needed in the short-term as well.  2. Chronic obstructive pulmonary disease, unspecified COPD type ([place]), concern for an exacerbation As noted above.  3. Bipolar affective disorder, remission status unspecified ([place]) She is currently followed by providers for her mental health, and on medications to manage.  Emphasized the importance of discontinuing and she noted this is planned.  4. Left sided abdominal pain Her left-sided pain seems more towards the abdomen than in the flank by description, although does start on the side and radiate down the lower abdomen on the left.  Is intermittent, and not severe.  Do feel obtaining a urine would be helpful, especially noting some dysuria that she mentione, as will be helpful to assess for possible infection, also kidney stone concern noted.  She has a history of this. She will provide a sample to the office later today or at the latest tomorrow and await those results. Staying well-hydrated emphasized.  5. Adenopathy, cervical She notes a tender nodule in her neck, she thinks a lymph node, and also has a sore throat at times, although denied when on the phone today.  May be related to her dental disease concern, possibly related to her URI symptoms, and ideally would like to have some blood test to help further evaluate.  Limited in obtaining nose presently. Emphasized if this is continuing, the importance of being seen and evaluated in an urgent care or emergency setting and she was understanding of that.  6. Tobacco dependence She continues to smoke daily  7. Dental disease As above, was seen by a dentist who recommended having her teeth pulled, and she was treated for an infectious concern at that point also.  She notes she cannot afford to have that done presently.  Still bothered by dental pain  at times.  8. Type 2 diabetes mellitus with microalbuminuria, without long-term current use of insulin ([place]) She notes her sugars have been better controlled at home recently, and she does check her sugars at home.  I did note her having a low yield to proceed to an urgent care setting to be seen, and if symptoms more problematic, especially if more persistent and severe abdominal pain, any nausea vomiting, higher fevers, worsening shortness of breath or chest pains developed, being seen in an emergency setting is appropriate and she was understanding of that.  I discussed the assessment and treatment plan with the patient. The patient was provided an opportunity to ask questions and all were answered. The patient agreed with the plan and demonstrated an understanding of the instructions.  Red flags and when to present for emergency care or RTC including fevers, chest pain, shortness of breath, new/worsening/un-resolving symptoms reviewed with patient at time of visit.   The patient was advised to call back or seek an in-person evaluation if the symptoms worsen or if the condition fails to improve as anticipated.  I provided 30 minutes of non-face-to-face time during this encounter that included discussing at length patient's sx/history, pertinent pmhx, medications, treatment and follow up plan. This time also included the necessary documentation, orders, and chart review.  Towanda Malkin, MD

## 2020-10-12 NOTE — Chronic Care Management (AMB) (Signed)
  Care Management   Note  10/12/2020 Name: [name] MRN: [id number] DOB: 07-Dec-1971  April Luna is a 49 y.o. year old female who is a primary care patient of Steele Sizer, MD and is actively engaged with the care management team. I reached out to Clayton Lefort by phone today to assist with scheduling a follow up visit with the RN Case Manager  Follow up plan: Unsuccessful telephone outreach attempt made. A HIPAA compliant phone message was left for the patient providing contact information and requesting a return call.  The care management team will reach out to the patient again over the next 7 days.  If patient returns call to provider office, please advise to call [place] at [phone].  [place] Management  Direct Dial: [phone]

## 2020-10-13 ENCOUNTER — Encounter: Payer: Self-pay | Admitting: Internal Medicine

## 2020-10-13 ENCOUNTER — Other Ambulatory Visit: Payer: Self-pay

## 2020-10-13 ENCOUNTER — Ambulatory Visit (INDEPENDENT_AMBULATORY_CARE_PROVIDER_SITE_OTHER): Payer: Medicare Other | Admitting: Internal Medicine

## 2020-10-13 DIAGNOSIS — F319 Bipolar disorder, unspecified: Secondary | ICD-10-CM | POA: Diagnosis not present

## 2020-10-13 DIAGNOSIS — R109 Unspecified abdominal pain: Secondary | ICD-10-CM

## 2020-10-13 DIAGNOSIS — E1129 Type 2 diabetes mellitus with other diabetic kidney complication: Secondary | ICD-10-CM

## 2020-10-13 DIAGNOSIS — B349 Viral infection, unspecified: Secondary | ICD-10-CM

## 2020-10-13 DIAGNOSIS — R300 Dysuria: Secondary | ICD-10-CM | POA: Diagnosis not present

## 2020-10-13 DIAGNOSIS — R590 Localized enlarged lymph nodes: Secondary | ICD-10-CM | POA: Diagnosis not present

## 2020-10-13 DIAGNOSIS — K089 Disorder of teeth and supporting structures, unspecified: Secondary | ICD-10-CM

## 2020-10-13 DIAGNOSIS — R809 Proteinuria, unspecified: Secondary | ICD-10-CM

## 2020-10-13 DIAGNOSIS — J449 Chronic obstructive pulmonary disease, unspecified: Secondary | ICD-10-CM | POA: Diagnosis not present

## 2020-10-13 DIAGNOSIS — F17200 Nicotine dependence, unspecified, uncomplicated: Secondary | ICD-10-CM

## 2020-10-13 MED ORDER — DOXYCYCLINE HYCLATE 100 MG PO TABS: 100.0000 mg | ORAL_TABLET | Freq: Two times a day (BID) | ORAL | 0 refills | Status: DC

## 2020-10-14 ENCOUNTER — Other Ambulatory Visit: Payer: Self-pay | Admitting: Internal Medicine

## 2020-10-14 DIAGNOSIS — N10 Acute tubulo-interstitial nephritis: Secondary | ICD-10-CM

## 2020-10-14 MED ORDER — CIPROFLOXACIN HCL 500 MG PO TABS: 500.0000 mg | ORAL_TABLET | Freq: Two times a day (BID) | ORAL | 0 refills | Status: AC

## 2020-10-14 NOTE — Progress Notes (Signed)
Patient called.  Patient aware.  

## 2020-10-14 NOTE — Progress Notes (Signed)
The urinalysis done on the urine sample she submitted was concerning for infection.  Noting her clinical symptoms in combination, concern for a possible pyelonephritis noted. Felt best to add a ciprofloxacin product for treatment, and this was prescribed. April Luna was asked to contact the patient to inform her of this, and also to note if she develops diarrhea while taking the medicine, she should follow-up.

## 2020-10-15 LAB — URINALYSIS, COMPLETE
Bilirubin Urine: NEGATIVE
Glucose, UA: NEGATIVE
Hyaline Cast: NONE SEEN /LPF
Nitrite: POSITIVE — AB
Specific Gravity, Urine: 1.022 (ref 1.001–1.03)
Squamous Epithelial / HPF: NONE SEEN /HPF (ref <=5)
pH: 5.5 (ref 5.0–8.0)

## 2020-10-15 LAB — URINE CULTURE
MICRO NUMBER:: 11096921
SPECIMEN QUALITY:: ADEQUATE

## 2020-10-19 NOTE — Chronic Care Management (AMB) (Signed)
  Care Management   Note  10/19/2020 Name: April Luna MRN: [id number] DOB: [birth year]  April Luna is a 49 y.o. year old female who is a primary care patient of Steele Sizer, MD and is actively engaged with the care management team. I reached out to Clayton Lefort by phone today to assist with re-scheduling a follow up visit with the RN Case Manager  Follow up plan: Unsuccessful telephone outreach attempt made. A HIPAA compliant phone message was left for the patient providing contact information and requesting a return call. The care management team will reach out to the patient again over the next 7 days. If patient returns call to provider office, please advise to call [place] at [phone].  [place] Management  Direct Dial: [phone]

## 2020-10-20 ENCOUNTER — Telehealth: Payer: Self-pay | Admitting: Family Medicine

## 2020-10-20 NOTE — Telephone Encounter (Signed)
Pt is calling and would like to know why she was prescribed two  abx  cipro and doxycycline and which one should she be taking

## 2020-10-20 NOTE — Chronic Care Management (AMB) (Signed)
  Care Management   Note  10/20/2020 Name: DOVEY FATZINGER MRN: [id number] DOB: 1971-04-21  [name] is a 49 y.o. year old female who is a primary care patient of Steele Sizer, MD and is actively engaged with the care management team. I reached out to Clayton Lefort by phone today to assist with re-scheduling a follow up visit with the RN Case Manager.   Ms. Blecha was given information about Chronic Care Management services today including:  1. CCM service includes personalized support from designated clinical staff supervised by her physician, including individualized plan of care and coordination with other care providers 2. 24/7 contact phone numbers for assistance for urgent and routine care needs. 3. Service will only be billed when office clinical staff spend 20 minutes or more in a month to coordinate care. 4. Only one practitioner may furnish and bill the service in a calendar month. 5. The patient may stop CCM services at any time (effective at the end of the month) by phone call to the office staff. 6. The patient will be responsible for cost sharing (co-pay) of up to 20% of the service fee (after annual deductible is met).  Patient agreed to services and verbal consent obtained.   Follow up plan: Telephone appointment with care management team member scheduled for: 11/05/2020  [facility]  [place], [place] [id number] Direct Dial: [email or web address] Website: [email or web address]

## 2020-10-21 NOTE — Telephone Encounter (Signed)
Please let the patient know that the doxycycline was initially prescribed with concerns for an exacerbation related to her COPD. The ciprofloxacin product was added after noting a positive urinalysis, and concerns for a bladder/possible kidney infection.  This was added as the doxycycline would not have been helpful for this. It is recommended that she continue both of these medications presently as part of the treatment recommendations. Thanks

## 2020-10-21 NOTE — Telephone Encounter (Signed)
Patient called.  Patient aware.  

## 2020-11-02 NOTE — Progress Notes (Addendum)
Name: April Luna   MRN: [id number]    DOB: Feb 18, 1971   Date:11/03/2020       Progress Note  Subjective  Chief Complaint  Follow up   HPI    COPD: she is supposed to take  Trelegy given by Dr. Patsey Berthold but she has been out of her medications. She has chronic cough and sputum that is yellow, she also has noticed increase in wheezing since out of trelegy, occasionally has sob with activity. . She is still smoking, back to one pack daily, used to smoke close to 2 packs in the past . Advised her to resume medication again   HTN:bp is at goal, since medication was adjusted no longer having dizziness or palpitation, she has intermittent pulsating sensation of left side of the chest that can last at most one minute, asked if it happens when she is anxious but she is not sure, advised to monitor the intensity, type of pain and if lasts more than 10 minutes   Hyperlipidemia: we switched from Pravachol to Crestor, she states she forgets to take medication, she states Armen Pickup is trying to help her be more compliant with medication   Chronic constipation:she states she was taking Amitiza once a day but now taking twice daily and bowel movements have improved, to once or twice daily and no straining, no blood in stools.   DMII: with dyslipidemia, on diet only, A1C today is at goal at 6.2 %  she states mouth is always dry, but no polyphagia or polyuria. She has  microalbuminuria and CKI stage III , she has dyslipidemia and is supposed to take medications, but stops all of them when manic.   Bipolar disorder:she is seeing psychiatrist Dr.A at [facility]  she went manic a couple of weeks ago and stopped medication, she is feeling down now Last admission was 2016 for a psychotic episode. No history of suicide attempts.  Hypothyroidism: she is taking levothyroxine and denies hair loss, she has chronic dry skin . Last TSH was at goal. Continue current regiment   Chronic right lower  back pain: going on for years, she states got better when adjusted, seems to be right on top of sacro- iliac joint, she states occasionally shoots down to her right lower leg. Taking gabapentin . Advised Tylenol. Pain is daily, aching worse on the right side, radiates down right leg intermittently  Lice: she states got it from her son - he is 47 yo, she states treated 6 months ago with otc medication and is much worse now.   Psoriasis: seen by dermatologist in the past, Dr. Koleen Nimrod, and was treated with topical medication but not seen a dermatologist in a while and would like to go back     Patient Active Problem List   Diagnosis Date Noted  . Plantar callus 10/06/2019  . Acquired hallux limitus of both feet 10/06/2019  . Morbid obesity ([place]) 06/24/2019  . Type 2 diabetes mellitus with microalbuminuria, without long-term current use of insulin ([place]) 06/24/2019  . Chronic obstructive pulmonary disease ([place]) 06/24/2019  . Chronic constipation 06/24/2019  . ASCUS of cervix with negative high risk HPV 09/05/2018  . GERD without esophagitis 04/11/2018  . Hyperlipidemia 04/11/2018  . Hypertension 04/11/2018  . Hypothyroidism 04/26/2015  . Bipolar I disorder, most recent episode (or current) manic ([place]) 04/25/2015  . Delirium due to another medical condition 04/25/2015  . Cannabis abuse 04/25/2015    Past Surgical History:  Procedure Laterality Date  .  CESAREAN SECTION    . CYSTOSCOPY W/ RETROGRADES Left 11/08/2018   Procedure: CYSTOSCOPY WITH RETROGRADE PYELOGRAM;  Surgeon: Billey Co, MD;  Location: [facility] ORS;  Service: Urology;  Laterality: Left;  . CYSTOSCOPY/URETEROSCOPY/HOLMIUM LASER/STENT PLACEMENT Left 11/08/2018   Procedure: CYSTOSCOPY/URETEROSCOPY/HOLMIUM LASER/STENT PLACEMENT;  Surgeon: Billey Co, MD;  Location: [facility] OR[facility];  Service: Urology;  Laterality: Left;  . MOUTH SURGERY     wisdom teeth extraction    Family History  Problem Relation Age of Onset  .  Depression Mother   . Anxiety disorder Mother   . Diabetes Mother   . Hypertension Mother   . Hyperlipidemia Mother   . Cancer Mother   . Cancer Father        colon cancer  . Depression Brother   . Anxiety disorder Brother   . Diabetes Mellitus II Maternal Grandmother   . Hypercholesterolemia Maternal Grandmother   . Cancer Paternal Grandmother   . Diabetes Paternal Grandmother     Social History   Tobacco Use  . Smoking status: Current Every Day Smoker    Packs/day: 1.00    Years: 34.00    Pack years: 34.00    Types: Cigarettes    Start date: 05/13/1985  . Smokeless tobacco: Never Used  Substance Use Topics  . Alcohol use: Not Currently    Alcohol/week: 0.0 standard drinks    Comment: rarely     Current Outpatient Medications:  .  albuterol (PROVENTIL) (2.5 MG/3ML) 0.083% nebulizer solution, Take 3 mLs (2.5 mg total) by nebulization every 6 (six) hours as needed for wheezing or shortness of breath., Disp: 150 mL, Rfl: 1 .  amantadine (SYMMETREL) 100 MG capsule, Take 100 mg by mouth 2 (two) times daily., Disp: , Rfl:  .  blood glucose meter kit and supplies, Dispense based on patient and insurance preference. Use up to four times daily as directed. (FOR ICD-10 E10.9, E11.9)., Disp: 1 each, Rfl: 0 .  buPROPion (WELLBUTRIN XL) 150 MG 24 hr tablet, Take 150 mg by mouth daily., Disp: , Rfl:  .  Cholecalciferol (VITAMIN D-3) 125 MCG (5000 UT) TABS, Take 5,000 Units by mouth daily., Disp: 30 tablet, Rfl: 1 .  Fluticasone-Umeclidin-Vilant (TRELEGY ELLIPTA) 100-62.5-25 MCG/INH AEPB, Inhale 1 puff into the lungs daily., Disp: 60 each, Rfl: 6 .  gabapentin (NEURONTIN) 300 MG capsule, Take 300 mg by mouth 2 (two) times daily., Disp: , Rfl:  .  hydrOXYzine (VISTARIL) 50 MG capsule, Take 50 mg by mouth 2 (two) times daily as needed for anxiety., Disp: , Rfl:  .  INVEGA SUSTENNA 234 MG/1.5ML SUSY injection, Inject into the muscle. Inject as directed once a month, Disp: , Rfl:  .   levothyroxine (SYNTHROID) 75 MCG tablet, TAKE 1 TABLET BY MOUTH DAILY AT 6 AM, Disp: 90 tablet, Rfl: 0 .  losartan (COZAAR) 50 MG tablet, Take 1 tablet (50 mg total) by mouth daily., Disp: 90 tablet, Rfl: 1 .  lubiprostone (AMITIZA) 24 MCG capsule, Take 1 capsule (24 mcg total) by mouth 2 (two) times daily with a meal., Disp: 180 capsule, Rfl: 0 .  nicotine (NICOTROL) 10 MG inhaler, Inhale 1 Cartridge (1 continuous puffing total) into the lungs as needed for smoking cessation., Disp: 36 each, Rfl: 3 .  oxybutynin (DITROPAN-XL) 10 MG 24 hr tablet, TAKE 1 TABLET BY MOUTH EACH DAY, Disp: 90 tablet, Rfl: 3 .  pantoprazole (PROTONIX) 40 MG tablet, Take 1 tablet (40 mg total) by mouth daily., Disp: 90 tablet, Rfl: 1 .  prazosin (MINIPRESS) 2 MG capsule, Take 2 mg by mouth at bedtime., Disp: , Rfl:  .  rosuvastatin (CRESTOR) 40 MG tablet, TAKE 1 TABLET BY MOUTH DAILY, Disp: 30 tablet, Rfl: 5 .  sertraline (ZOLOFT) 100 MG tablet, Take 200 mg by mouth daily., Disp: , Rfl:  .  VASCEPA 1 g capsule, TAKE 2 CAPSULES BY MOUTH TWICE A DAY, Disp: 120 capsule, Rfl: 5 .  malathion (OVIDE) 0.5 % lotion, Apply topically once for 1 dose. Sprinkle lotion on dry hair and rub gently until the scalp is thoroughly moistened. Allow to dry naturally and leave uncovered., Disp: 59 mL, Rfl: 1  Allergies  Allergen Reactions  . Metformin And Related Nausea And Vomiting  . Penicillins Other (See Comments)    She has taken amoxicillin without problems  . Perphenazine Other (See Comments)    Tremors, muscle weakness, tongue swelling  . Sulfa Antibiotics Other (See Comments)    GI distress   . Abilify [Aripiprazole] Rash    I personally reviewed active problem list, medication list, allergies, family history, social history, health maintenance with the patient/caregiver today.   ROS  Constitutional: Negative for fever or weight change.  Respiratory: positive  for cough and shortness of breath.   Cardiovascular: Negative  for chest pain or palpitations.  Gastrointestinal: Negative for abdominal pain, no bowel changes.  Musculoskeletal: positive  for gait problem or joint swelling.  Skin:positive  for rash.  Neurological: Negative for dizziness or headache.  No other specific complaints in a complete review of systems (except as listed in HPI above).  Objective  Vitals:   11/03/20 1438  BP: 118/70  Pulse: 94  Resp: 16  Temp: 98 F (36.7 C)  SpO2: 99%  Weight: 155 lb 9.6 oz (70.6 kg)  Height: '4\' 11"'  (1.499 m)    Body mass index is 31.43 kg/m.  Physical Exam  Constitutional: Patient appears well-developed and well-nourished. Obese No distress.  HEENT: head atraumatic, normocephalic, pupils equal and reactive to light,neck supple Cardiovascular: Normal rate, regular rhythm and normal heart sounds.  No murmur heard. No BLE edema. Pulmonary/Chest: Effort normal and breath sounds normal. No respiratory distress. Abdominal: Soft.  There is no tenderness. Psychiatric: Patient has a normal mood and affect. behavior is normal. Judgment and thought content normal. Hair: nits noticed   Recent Results (from the past 2160 hour(s))  Urinalysis, Complete     Status: Abnormal   Collection Time: 10/13/20  2:57 PM  Result Value Ref Range   Color, Urine DARK YELLOW YELLOW   APPearance CLOUDY (A) CLEAR   Specific Gravity, Urine 1.022 1.001 - 1.03   pH 5.5 5.0 - 8.0   Glucose, UA NEGATIVE NEGATIVE   Bilirubin Urine NEGATIVE NEGATIVE   Ketones, ur TRACE (A) NEGATIVE   Hgb urine dipstick TRACE (A) NEGATIVE   Protein, ur TRACE (A) NEGATIVE   Nitrite POSITIVE (A) NEGATIVE   Leukocytes,Ua 3+ (A) NEGATIVE   WBC, UA 40-60 (A) 0 - 5 /HPF   RBC / HPF 0-2 0 - 2 /HPF   Squamous Epithelial / LPF NONE SEEN < OR = 5 /HPF   Bacteria, UA MODERATE (A) NONE SEEN /HPF   Calcium Oxalate Crystal FEW NONE OR FE /HPF   Hyaline Cast NONE SEEN NONE SEEN /LPF  Urine Culture     Status: Abnormal   Collection Time: 10/13/20   2:57 PM   Specimen: Urine  Result Value Ref Range   MICRO NUMBER: [id number]    SPECIMEN QUALITY:  Adequate    Sample Source URINE    STATUS: FINAL    ISOLATE 1: Klebsiella oxytoca (A)     Comment: Greater than 100,000 CFU/mL of Klebsiella oxytoca      Susceptibility   Klebsiella oxytoca - URINE CULTURE, REFLEX    AMOX/CLAVULANIC 8 Sensitive     AMPICILLIN >=32 Resistant     AMPICILLIN/SULBACTAM 16 Intermediate     CEFAZOLIN* 8 Resistant      * For uncomplicated UTI caused by E. coli,K. pneumoniae or P. mirabilis: Cefazolin issusceptible if MIC <32 mcg/mL and predictssusceptible to the oral agents cefaclor, cefdinir,cefpodoxime, cefprozil, cefuroxime, cephalexinand loracarbef.    CEFEPIME <=1 Sensitive     CEFTRIAXONE <=1 Sensitive     CIPROFLOXACIN <=0.25 Sensitive     LEVOFLOXACIN <=0.12 Sensitive     ERTAPENEM <=0.5 Sensitive     GENTAMICIN <=1 Sensitive     IMIPENEM <=0.25 Sensitive     NITROFURANTOIN 32 Sensitive     PIP/TAZO <=4 Sensitive     TOBRAMYCIN <=1 Sensitive     TRIMETH/SULFA* <=20 Sensitive      * For uncomplicated UTI caused by E. coli,K. pneumoniae or P. mirabilis: Cefazolin issusceptible if MIC <32 mcg/mL and predictssusceptible to the oral agents cefaclor, cefdinir,cefpodoxime, cefprozil, cefuroxime, cephalexinand loracarbef.Legend:S = Susceptible  I = IntermediateR = Resistant  NS = Not susceptible* = Not tested  NR = Not reported**NN = See antimicrobic comments  POCT HgB A1C     Status: Abnormal   Collection Time: 11/03/20  2:55 PM  Result Value Ref Range   Hemoglobin A1C 6.2 (A) 4.0 - 5.6 %   HbA1c POC (<> result, manual entry)     HbA1c, POC (prediabetic range)     HbA1c, POC (controlled diabetic range)      Diabetic Foot Exam: Diabetic Foot Exam - Simple   Simple Foot Form Diabetic Foot exam was performed with the following findings: Yes 11/03/2020  3:18 PM  Visual Inspection No deformities, no ulcerations, no other skin breakdown bilaterally:  Yes Sensation Testing Intact to touch and monofilament testing bilaterally: Yes Pulse Check Posterior Tibialis and Dorsalis pulse intact bilaterally: Yes Comments      PHQ2/9: Depression screen [facility] 2/9 11/03/2020 10/13/2020 06/29/2020 04/21/2020 09/24/2019  Decreased Interest 1 3 0 0 1  Down, Depressed, Hopeless 1 1 0 0 0  PHQ - 2 Score 2 4 0 0 1  Altered sleeping 2 1 - 0 0  Tired, decreased energy 2 3 - 2 1  Change in appetite 3 2 - 2 1  Feeling bad or failure about yourself  2 0 - 0 0  Trouble concentrating 3 2 - 3 2  Moving slowly or fidgety/restless 0 2 - 1 0  Suicidal thoughts 0 0 - 0 0  PHQ-9 Score 14 14 - 8 5  Difficult doing work/chores Somewhat difficult Very difficult - Somewhat difficult Somewhat difficult  Some recent data might be hidden    phq 9 is positive   Fall Risk: Fall Risk  11/03/2020 11/03/2020 10/13/2020 06/29/2020 04/21/2020  Falls in the past year? 0 0 0 0 0  Number falls in past yr: 0 0 0 0 0  Injury with Fall? 0 0 0 0 0  Risk for fall due to : - - - No Fall Risks -  Follow up - - - Falls prevention discussed -     Functional Status Survey: Is the patient deaf or have difficulty hearing?: No Does the patient have difficulty seeing,  even when wearing glasses/contacts?: No Does the patient have difficulty concentrating, remembering, or making decisions?: No Does the patient have difficulty walking or climbing stairs?: No Does the patient have difficulty dressing or bathing?: No Does the patient have difficulty doing errands alone such as visiting a doctor's office or shopping?: No    Assessment & Plan  1. Bipolar affective disorder,with recent manic episode [facility])   Keep follow up with psychiatrist   2. Need for hepatitis C screening test  - Hepatitis C Antibody  3. Need for immunization against influenza  - Flu Vaccine QUAD 36+ mos IM  4. Type 2 diabetes mellitus with microalbuminuria, without long-term current use of insulin (HCC)  -  POCT HgB A1C - losartan (COZAAR) 50 MG tablet; Take 1 tablet (50 mg total) by mouth daily.  Dispense: 90 tablet; Refill: 1  5. Chronic obstructive pulmonary disease, unspecified COPD type ([place])   6. GERD without esophagitis  - pantoprazole (PROTONIX) 40 MG tablet; Take 1 tablet (40 mg total) by mouth daily.  Dispense: 90 tablet; Refill: 1  7. Hypothyroidism due to medication   8. Dyslipidemia associated with type 2 diabetes mellitus (HCC)  - Lipid panel - COMPLETE METABOLIC PANEL WITH GFR  9. Chronic constipation  - lubiprostone (AMITIZA) 24 MCG capsule; Take 1 capsule (24 mcg total) by mouth 2 (two) times daily with a meal.  Dispense: 180 capsule; Refill: 0  10. Essential hypertension  - losartan (COZAAR) 50 MG tablet; Take 1 tablet (50 mg total) by mouth daily.  Dispense: 90 tablet; Refill: 1  11. Tobacco dependence   12. Lice infestation  - malathion (OVIDE) 0.5 % lotion; Apply topically once for 1 dose. Sprinkle lotion on dry hair and rub gently until the scalp is thoroughly moistened. Allow to dry naturally and leave uncovered.  Dispense: 59 mL; Refill: 1  13. Psoriasis  - Ambulatory referral to Dermatology  14. Chronic right-sided low back pain with right-sided sciatica

## 2020-11-03 ENCOUNTER — Ambulatory Visit (INDEPENDENT_AMBULATORY_CARE_PROVIDER_SITE_OTHER): Payer: Medicare Other | Admitting: Family Medicine

## 2020-11-03 ENCOUNTER — Encounter: Payer: Self-pay | Admitting: Family Medicine

## 2020-11-03 ENCOUNTER — Other Ambulatory Visit: Payer: Self-pay

## 2020-11-03 VITALS — BP 118/70 | HR 94 | Temp 98.0°F | Resp 16 | Ht 59.0 in | Wt 155.6 lb

## 2020-11-03 DIAGNOSIS — E1169 Type 2 diabetes mellitus with other specified complication: Secondary | ICD-10-CM

## 2020-11-03 DIAGNOSIS — J449 Chronic obstructive pulmonary disease, unspecified: Secondary | ICD-10-CM

## 2020-11-03 DIAGNOSIS — K219 Gastro-esophageal reflux disease without esophagitis: Secondary | ICD-10-CM

## 2020-11-03 DIAGNOSIS — I10 Essential (primary) hypertension: Secondary | ICD-10-CM

## 2020-11-03 DIAGNOSIS — E785 Hyperlipidemia, unspecified: Secondary | ICD-10-CM | POA: Diagnosis not present

## 2020-11-03 DIAGNOSIS — G8929 Other chronic pain: Secondary | ICD-10-CM

## 2020-11-03 DIAGNOSIS — Z23 Encounter for immunization: Secondary | ICD-10-CM | POA: Diagnosis not present

## 2020-11-03 DIAGNOSIS — E032 Hypothyroidism due to medicaments and other exogenous substances: Secondary | ICD-10-CM

## 2020-11-03 DIAGNOSIS — M5441 Lumbago with sciatica, right side: Secondary | ICD-10-CM

## 2020-11-03 DIAGNOSIS — F3132 Bipolar disorder, current episode depressed, moderate: Secondary | ICD-10-CM | POA: Diagnosis not present

## 2020-11-03 DIAGNOSIS — E1129 Type 2 diabetes mellitus with other diabetic kidney complication: Secondary | ICD-10-CM

## 2020-11-03 DIAGNOSIS — R809 Proteinuria, unspecified: Secondary | ICD-10-CM | POA: Diagnosis not present

## 2020-11-03 DIAGNOSIS — F17200 Nicotine dependence, unspecified, uncomplicated: Secondary | ICD-10-CM

## 2020-11-03 DIAGNOSIS — Z1159 Encounter for screening for other viral diseases: Secondary | ICD-10-CM

## 2020-11-03 DIAGNOSIS — K5909 Other constipation: Secondary | ICD-10-CM

## 2020-11-03 DIAGNOSIS — B852 Pediculosis, unspecified: Secondary | ICD-10-CM

## 2020-11-03 DIAGNOSIS — L409 Psoriasis, unspecified: Secondary | ICD-10-CM

## 2020-11-03 LAB — POCT GLYCOSYLATED HEMOGLOBIN (HGB A1C): Hemoglobin A1C: 6.2 % — AB (ref 4.0–5.6)

## 2020-11-03 MED ORDER — MALATHION 0.5 % EX LOTN: TOPICAL_LOTION | Freq: Once | CUTANEOUS | 1 refills | Status: AC

## 2020-11-03 MED ORDER — LUBIPROSTONE 24 MCG PO CAPS: 24.0000 ug | ORAL_CAPSULE | Freq: Two times a day (BID) | ORAL | 0 refills | Status: DC

## 2020-11-03 MED ORDER — LOSARTAN POTASSIUM 50 MG PO TABS: 50.0000 mg | ORAL_TABLET | Freq: Every day | ORAL | 1 refills | Status: DC

## 2020-11-03 MED ORDER — PANTOPRAZOLE SODIUM 40 MG PO TBEC: 40.0000 mg | DELAYED_RELEASE_TABLET | Freq: Every day | ORAL | 1 refills | Status: DC

## 2020-11-04 LAB — COMPLETE METABOLIC PANEL WITH GFR
AG Ratio: 1.7 (calc) (ref 1.0–2.5)
ALT: 10 U/L (ref 6–29)
AST: 14 U/L (ref 10–35)
Albumin: 4.3 g/dL (ref 3.6–5.1)
Alkaline phosphatase (APISO): 94 U/L (ref 31–125)
BUN/Creatinine Ratio: 8 (calc) (ref 6–22)
BUN: 10 mg/dL (ref 7–25)
CO2: 30 mmol/L (ref 20–32)
Calcium: 9.2 mg/dL (ref 8.6–10.2)
Chloride: 101 mmol/L (ref 98–110)
Creat: 1.18 mg/dL — ABNORMAL HIGH (ref 0.50–1.10)
GFR, Est African American: 64 mL/min/{1.73_m2} (ref >=60)
GFR, Est Non African American: 55 mL/min/{1.73_m2} — ABNORMAL LOW (ref >=60)
Globulin: 2.6 g/dL (calc) (ref 1.9–3.7)
Glucose, Bld: 95 mg/dL (ref 65–99)
Potassium: 4.1 mmol/L (ref 3.5–5.3)
Sodium: 138 mmol/L (ref 135–146)
Total Bilirubin: 0.4 mg/dL (ref 0.2–1.2)
Total Protein: 6.9 g/dL (ref 6.1–8.1)

## 2020-11-04 LAB — LIPID PANEL
Cholesterol: 231 mg/dL — ABNORMAL HIGH (ref <200)
HDL: 42 mg/dL — ABNORMAL LOW (ref >=50)
LDL Cholesterol (Calc): 155 mg/dL (calc) — ABNORMAL HIGH
Non-HDL Cholesterol (Calc): 189 mg/dL (calc) — ABNORMAL HIGH (ref <130)
Total CHOL/HDL Ratio: 5.5 (calc) — ABNORMAL HIGH (ref <5.0)
Triglycerides: 205 mg/dL — ABNORMAL HIGH (ref <150)

## 2020-11-04 LAB — HEPATITIS C ANTIBODY
Hepatitis C Ab: NONREACTIVE
SIGNAL TO CUT-OFF: 0.01 (ref <1.00)

## 2020-11-05 ENCOUNTER — Telehealth: Payer: Self-pay

## 2020-11-05 ENCOUNTER — Telehealth: Payer: Medicare Other

## 2020-11-05 NOTE — Telephone Encounter (Signed)
  Chronic Care Management   Outreach Note  11/05/2020 Name: April Luna MRN: [id number] DOB: 02-21-1971  Primary Care Provider: Steele Sizer, MD Reason for referral : Chronic Care Management    An unsuccessful telephone outreach was attempted today. [name] was referred to the case management team for assistance with care management and care coordination. Received notification that she could be reached at her new contact number [phone]. Unfortunately the phone rang multiple times without option to leave a voice message.     Follow Up Plan:  A member of the care management team will reach out to [name] again within the next two weeks.    Cristy [facility] Care Management [facility] [phone]

## 2020-11-15 ENCOUNTER — Telehealth: Payer: Medicare Other

## 2020-11-15 ENCOUNTER — Ambulatory Visit: Payer: Self-pay

## 2020-11-15 NOTE — Chronic Care Management (AMB) (Signed)
  Chronic Care Management   Outreach Note  11/15/2020 Name: April Luna MRN: [id number] DOB: [birth year]     Primary Care Provider: Steele Sizer, MD Reason for referral : Chronic Care Management    Ms. Coulon was referred to the care management team for assistance with chronic care management and care coordination. Her primary care provider will be notified of our unsuccessful attempts to maintain contact. The care management team will gladly outreach at any time in the future if she is interested in receiving assistance.    Follow Up Plan:  The care management team will gladly follow up with her after the primary care provider has a conversation with her regarding recommendation for care management engagement and subsequent re-referral for care management services.     Cristy [facility] Care Management [facility] [phone]

## 2020-12-06 ENCOUNTER — Other Ambulatory Visit: Payer: Self-pay | Admitting: Family Medicine

## 2020-12-06 DIAGNOSIS — E032 Hypothyroidism due to medicaments and other exogenous substances: Secondary | ICD-10-CM

## 2020-12-20 ENCOUNTER — Other Ambulatory Visit: Payer: Self-pay | Admitting: Urology

## 2020-12-21 ENCOUNTER — Other Ambulatory Visit: Payer: Self-pay | Admitting: Family Medicine

## 2020-12-21 NOTE — Telephone Encounter (Signed)
Requested medication (s) are due for refill today: yes  Requested medication (s) are on the active medication list: yes  Last refill:  12/30/19 #90 with 3 refills  Future visit scheduled: yes  Notes to clinic:  Please review for refill. Previously filled by another provider    Requested Prescriptions  Pending Prescriptions Disp Refills   oxybutynin (DITROPAN-XL) 10 MG 24 hr tablet [Pharmacy Med Name: OXYBUTYNIN CHLORIDE ER 10 MG TAB] 90 tablet 3    Sig: TAKE 1 TABLET BY MOUTH EACH DAY      Urology:  Bladder Agents Passed - 12/21/2020 10:44 AM      Passed - Valid encounter within last 12 months    Recent Outpatient Visits           1 month ago Need for hepatitis C screening test   [facility] Alba Cory, MD   2 months ago Viral syndrome   [facility] Jamelle Haring, MD   3 months ago Erroneous encounter - disregard   [facility] [place], Cornelius L, [place]   7 months ago No-show for appointment   [facility] Alba Cory, MD   8 months ago Type 2 diabetes mellitus with microalbuminuria, without long-term current use of insulin [facility])   [facility] Alba Cory, MD       Future Appointments             In 1 month Carlynn Purl, Danna Hefty, MD [facility]   In 3 months Deirdre Evener, MD [facility]   In 4 months Alba Cory, MD [facility], [facility]   In 6 months  [facility], [facility]

## 2020-12-22 ENCOUNTER — Other Ambulatory Visit: Payer: Self-pay | Admitting: Pulmonary Disease

## 2020-12-22 ENCOUNTER — Ambulatory Visit: Payer: Self-pay

## 2020-12-22 NOTE — Telephone Encounter (Signed)
Pariet called stating that she has been told that she needs to see a kidney specialist lung specialist and urologist.  She states that she has not received any call from them and would like call back from office about this.  She states that she has no symptoms today but is just worried that she missed there calls because her phone has been cut off so many times. Patient was told that she could not call the specialist that the office would reach out to them and they would call her. Please advise patient  Reason for Disposition . [1] Caller requesting NON-URGENT health information AND [2] PCP's office is the best resource  Answer Assessment - Initial Assessment Questions 1. REASON FOR CALL or QUESTION: "What is your reason for calling today?" or "How can I best help you?" or "What question do you have that I can help answer?"     Patient called with questions about referrals to [facility] specialist urology and pumonary  Protocols used: INFORMATION ONLY CALL - NO TRIAGE-A-AH

## 2020-12-23 ENCOUNTER — Other Ambulatory Visit: Payer: Self-pay

## 2021-01-13 ENCOUNTER — Telehealth: Payer: Self-pay

## 2021-01-13 DIAGNOSIS — J449 Chronic obstructive pulmonary disease, unspecified: Secondary | ICD-10-CM

## 2021-01-13 NOTE — Telephone Encounter (Signed)
Copied from [place] [phone]. Topic: Referral - Request for Referral >> Jan 13, 2021  3:25 PM Alanda Slim E wrote: Has patient seen PCP for this complaint? Yes  *If NO, is insurance requiring patient see PCP for this issue before PCP can refer them? Referral for which specialty: Pulmonology  Preferred provider/office: Dr. Patsey Berthold at [facility] in [place]  Reason for referral: Needs for her lungs and COPD/      Pt needs a referral to Nephrology  with [place] in the area for Stage 3 kidney disease / please advise

## 2021-01-14 NOTE — Telephone Encounter (Signed)
Please call patient. She needs a visit for referrals.

## 2021-01-17 NOTE — Telephone Encounter (Signed)
lvm to see about getting a sooner appt for her than the one that she has already

## 2021-01-18 ENCOUNTER — Telehealth: Payer: Self-pay | Admitting: Pulmonary Disease

## 2021-01-18 MED ORDER — TRELEGY ELLIPTA 100-62.5-25 MCG/INH IN AEPB: 1.0000 | INHALATION_SPRAY | Freq: Every day | RESPIRATORY_TRACT | 0 refills | Status: DC

## 2021-01-18 NOTE — Progress Notes (Unsigned)
Name: April Luna   MRN: [id number]    DOB: 1971/09/19   Date:01/19/2021       Progress Note  Subjective  Chief Complaint  Acute visit to discuss referral   HPI  CKI stage III: she asked to be referred to nephrologist. Explained that her GFR goes up and down, and barely below normal range, urine micro has improved down from 96 to 62 with ARB. No need to refer at this time. Advised to continue Losartan, keep A1C at goal, avoid NSAID's and we will keep monitoring and refer when appropriate  ASCUS: she has a CPE scheduled    Patient Active Problem List   Diagnosis Date Noted  . Plantar callus 10/06/2019  . Acquired hallux limitus of both feet 10/06/2019  . Morbid obesity ([place]) 06/24/2019  . Type 2 diabetes mellitus with microalbuminuria, without long-term current use of insulin ([place]) 06/24/2019  . Chronic obstructive pulmonary disease ([place]) 06/24/2019  . Chronic constipation 06/24/2019  . ASCUS of cervix with negative high risk HPV 09/05/2018  . GERD without esophagitis 04/11/2018  . Hyperlipidemia 04/11/2018  . Hypertension 04/11/2018  . Hypothyroidism 04/26/2015  . Bipolar I disorder, most recent episode (or current) manic ([place]) 04/25/2015  . Delirium due to another medical condition 04/25/2015  . Cannabis abuse 04/25/2015    Past Surgical History:  Procedure Laterality Date  . CESAREAN SECTION    . CYSTOSCOPY W/ RETROGRADES Left 11/08/2018   Procedure: CYSTOSCOPY WITH RETROGRADE PYELOGRAM;  Surgeon: Billey Co, MD;  Location: [facility] ORS;  Service: Urology;  Laterality: Left;  . CYSTOSCOPY/URETEROSCOPY/HOLMIUM LASER/STENT PLACEMENT Left 11/08/2018   Procedure: CYSTOSCOPY/URETEROSCOPY/HOLMIUM LASER/STENT PLACEMENT;  Surgeon: Billey Co, MD;  Location: [facility] OR[facility];  Service: Urology;  Laterality: Left;  . MOUTH SURGERY     wisdom teeth extraction    Family History  Problem Relation Age of Onset  . Depression Mother   . Anxiety disorder Mother   . Diabetes  Mother   . Hypertension Mother   . Hyperlipidemia Mother   . Cancer Mother   . Cancer Father        colon cancer  . Depression Brother   . Anxiety disorder Brother   . Diabetes Mellitus II Maternal Grandmother   . Hypercholesterolemia Maternal Grandmother   . Cancer Paternal Grandmother   . Diabetes Paternal Grandmother     Social History   Tobacco Use  . Smoking status: Current Every Day Smoker    Packs/day: 1.00    Years: 34.00    Pack years: 34.00    Types: Cigarettes    Start date: 05/13/1985  . Smokeless tobacco: Never Used  Substance Use Topics  . Alcohol use: Not Currently    Alcohol/week: 0.0 standard drinks    Comment: rarely     Current Outpatient Medications:  .  albuterol (PROVENTIL) (2.5 MG/3ML) 0.083% nebulizer solution, Take 3 mLs (2.5 mg total) by nebulization every 6 (six) hours as needed for wheezing or shortness of breath., Disp: 150 mL, Rfl: 1 .  amantadine (SYMMETREL) 100 MG capsule, Take 100 mg by mouth 2 (two) times daily., Disp: , Rfl:  .  blood glucose meter kit and supplies, Dispense based on patient and insurance preference. Use up to four times daily as directed. (FOR ICD-10 E10.9, E11.9)., Disp: 1 each, Rfl: 0 .  buPROPion (WELLBUTRIN XL) 150 MG 24 hr tablet, Take 150 mg by mouth daily., Disp: , Rfl:  .  Cholecalciferol (VITAMIN D-3) 125 MCG (5000 UT) TABS, Take 5,000  Units by mouth daily., Disp: 30 tablet, Rfl: 1 .  Fluticasone-Umeclidin-Vilant (TRELEGY ELLIPTA) 100-62.5-25 MCG/INH AEPB, Inhale 1 puff into the lungs daily., Disp: 60 each, Rfl: 0 .  gabapentin (NEURONTIN) 300 MG capsule, Take 300 mg by mouth 2 (two) times daily., Disp: , Rfl:  .  hydrOXYzine (VISTARIL) 50 MG capsule, Take 50 mg by mouth 2 (two) times daily as needed for anxiety., Disp: , Rfl:  .  INVEGA SUSTENNA 234 MG/1.5ML SUSY injection, Inject into the muscle. Inject as directed once a month, Disp: , Rfl:  .  levothyroxine (SYNTHROID) 75 MCG tablet, TAKE 1 TABLET BY MOUTH DAILY  AT 6 AM, Disp: 90 tablet, Rfl: 0 .  losartan (COZAAR) 50 MG tablet, Take 1 tablet (50 mg total) by mouth daily., Disp: 90 tablet, Rfl: 1 .  lubiprostone (AMITIZA) 24 MCG capsule, Take 1 capsule (24 mcg total) by mouth 2 (two) times daily with a meal., Disp: 180 capsule, Rfl: 0 .  malathion (OVIDE) 0.5 % lotion, Apply topically., Disp: , Rfl:  .  nicotine (NICOTROL) 10 MG inhaler, Inhale 1 Cartridge (1 continuous puffing total) into the lungs as needed for smoking cessation., Disp: 36 each, Rfl: 3 .  oxybutynin (DITROPAN-XL) 10 MG 24 hr tablet, TAKE 1 TABLET BY MOUTH EACH DAY, Disp: 90 tablet, Rfl: 3 .  pantoprazole (PROTONIX) 40 MG tablet, Take 1 tablet (40 mg total) by mouth daily., Disp: 90 tablet, Rfl: 1 .  prazosin (MINIPRESS) 2 MG capsule, Take 2 mg by mouth at bedtime., Disp: , Rfl:  .  rosuvastatin (CRESTOR) 40 MG tablet, TAKE 1 TABLET BY MOUTH DAILY, Disp: 30 tablet, Rfl: 5 .  sertraline (ZOLOFT) 100 MG tablet, Take 200 mg by mouth daily., Disp: , Rfl:  .  VASCEPA 1 g capsule, TAKE 2 CAPSULES BY MOUTH TWICE A DAY, Disp: 120 capsule, Rfl: 5  Allergies  Allergen Reactions  . Metformin And Related Nausea And Vomiting  . Penicillins Other (See Comments)    She has taken amoxicillin without problems  . Perphenazine Other (See Comments)    Tremors, muscle weakness, tongue swelling  . Sulfa Antibiotics Other (See Comments)    GI distress   . Abilify [Aripiprazole] Rash    I personally reviewed active problem list, medication list, allergies, family history, social history, health maintenance with the patient/caregiver today.   ROS  Constitutional: Negative for fever , positive for weight change.  Respiratory: positive  for cough and shortness of breath.   Cardiovascular: Negative for chest pain or palpitations.  Gastrointestinal: Negative for abdominal pain, no bowel changes.  Musculoskeletal: Negative for gait problem or joint swelling.  Skin: Negative for rash.  Neurological:  Negative for dizziness or headache.  No other specific complaints in a complete review of systems (except as listed in HPI above).  Objective  Vitals:   01/19/21 0942  BP: 132/78  Pulse: 100  Resp: 17  Temp: 98.7 F (37.1 C)  TempSrc: Oral  SpO2: 96%  Weight: 160 lb (72.6 kg)  Height: '4\' 11"'  (1.499 m)    Body mass index is 32.32 kg/m.  Physical Exam  Constitutional: Patient appears well-developed and well-nourished. Obese  No distress.  HEENT: head atraumatic, normocephalic, pupils equal and reactive to light,  neck supple Cardiovascular: Normal rate, regular rhythm and normal heart sounds.  No murmur heard. No BLE edema. Pulmonary/Chest: Effort normal but has end inspiratory wheezing . No respiratory distress. Abdominal: Soft.  There is no tenderness. Psychiatric: Patient has a normal mood  and affect. behavior is normal. Judgment and thought content normal.  Recent Results (from the past 2160 hour(s))  POCT HgB A1C     Status: Abnormal   Collection Time: 11/03/20  2:55 PM  Result Value Ref Range   Hemoglobin A1C 6.2 (A) 4.0 - 5.6 %   HbA1c POC (<> result, manual entry)     HbA1c, POC (prediabetic range)     HbA1c, POC (controlled diabetic range)    Hepatitis C Antibody     Status: None   Collection Time: 11/03/20  3:25 PM  Result Value Ref Range   Hepatitis C Ab NON-REACTIVE NON-REACTI   SIGNAL TO CUT-OFF 0.01 <1.00    Comment: . HCV antibody was non-reactive. There is no laboratory  evidence of HCV infection. . In most cases, no further action is required. However, if recent HCV exposure is suspected, a test for HCV RNA (test code [id number]) is suggested. . For additional information please refer to http://education.questdiagnostics.com/faq/FAQ22v1 (This link is being provided for informational/ educational purposes only.) .   Lipid panel     Status: Abnormal   Collection Time: 11/03/20  3:25 PM  Result Value Ref Range   Cholesterol 231 (H) <200 mg/dL   HDL  42 (L) > OR = 50 mg/dL   Triglycerides 205 (H) <150 mg/dL    Comment: . If a non-fasting specimen was collected, consider repeat triglyceride testing on a fasting specimen if clinically indicated.  Yates Decamp et al. J. of Clin. Lipidol. 0109;3:235-573. Marland Kitchen    LDL Cholesterol (Calc) 155 (H) mg/dL (calc)    Comment: Reference range: <100 . Desirable range <100 mg/dL for primary prevention;   <70 mg/dL for patients with CHD or diabetic patients  with > or = 2 CHD risk factors. Marland Kitchen LDL-C is now calculated using the Martin-Hopkins  calculation, which is a validated novel method providing  better accuracy than the Friedewald equation in the  estimation of LDL-C.  Cresenciano Genre et al. Annamaria Helling. 2202;542(70): 2061-2068  (http://education.[facility].com/faq/FAQ164)    Total CHOL/HDL Ratio 5.5 (H) <5.0 (calc)   Non-HDL Cholesterol (Calc) 189 (H) <130 mg/dL (calc)    Comment: For patients with diabetes plus 1 major ASCVD risk  factor, treating to a non-HDL-C goal of <100 mg/dL  (LDL-C of <70 mg/dL) is considered a therapeutic  option.   COMPLETE METABOLIC PANEL WITH GFR     Status: Abnormal   Collection Time: 11/03/20  3:25 PM  Result Value Ref Range   Glucose, Bld 95 65 - 99 mg/dL    Comment: .            Fasting reference interval .    BUN 10 7 - 25 mg/dL   Creat 1.18 (H) 0.50 - 1.10 mg/dL   GFR, Est Non African American 55 (L) > OR = 60 mL/min/1.35m   GFR, Est African American 64 > OR = 60 mL/min/1.733m  BUN/Creatinine Ratio 8 6 - 22 (calc)   Sodium 138 135 - 146 mmol/L   Potassium 4.1 3.5 - 5.3 mmol/L   Chloride 101 98 - 110 mmol/L   CO2 30 20 - 32 mmol/L   Calcium 9.2 8.6 - 10.2 mg/dL   Total Protein 6.9 6.1 - 8.1 g/dL   Albumin 4.3 3.6 - 5.1 g/dL   Globulin 2.6 1.9 - 3.7 g/dL (calc)   AG Ratio 1.7 1.0 - 2.5 (calc)   Total Bilirubin 0.4 0.2 - 1.2 mg/dL   Alkaline phosphatase (APISO) 94 31 - 125 U/L  AST 14 10 - 35 U/L   ALT 10 6 - 29 U/L     PHQ2/9: Depression screen  [facility] 2/9 01/19/2021 11/03/2020 10/13/2020 06/29/2020 04/21/2020  Decreased Interest 0 1 3 0 0  Down, Depressed, Hopeless '1 1 1 ' 0 0  PHQ - 2 Score '1 2 4 ' 0 0  Altered sleeping 0 2 1 - 0  Tired, decreased energy '3 2 3 ' - 2  Change in appetite 0 3 2 - 2  Feeling bad or failure about yourself  0 2 0 - 0  Trouble concentrating '2 3 2 ' - 3  Moving slowly or fidgety/restless 0 0 2 - 1  Suicidal thoughts 0 0 0 - 0  PHQ-9 Score '6 14 14 ' - 8  Difficult doing work/chores - Somewhat difficult Very difficult - Somewhat difficult  Some recent data might be hidden    phq 9 is positive   Fall Risk: Fall Risk  01/19/2021 11/03/2020 11/03/2020 10/13/2020 06/29/2020  Falls in the past year? 0 0 0 0 0  Number falls in past yr: 0 0 0 0 0  Injury with Fall? 0 0 0 0 0  Risk for fall due to : - - - - No Fall Risks  Follow up - - - - Falls prevention discussed     Functional Status Survey: Is the patient deaf or have difficulty hearing?: Yes Does the patient have difficulty seeing, even when wearing glasses/contacts?: No Does the patient have difficulty concentrating, remembering, or making decisions?: Yes Does the patient have difficulty walking or climbing stairs?: No Does the patient have difficulty dressing or bathing?: No Does the patient have difficulty doing errands alone such as visiting a doctor's office or shopping?: No    Assessment & Plan  1. Colon cancer screening  - Ambulatory referral to Gastroenterology  2. Type 2 diabetes mellitus with microalbuminuria, without long-term current use of insulin (HCC)   3. Stage 3a chronic kidney disease ([place])  Discussed chronic kidney disease and how to prevent progression

## 2021-01-18 NOTE — Telephone Encounter (Signed)
One month supply of Trelegy 100 has been sent to preferred pharmacy.  Patient is aware that she must keep her scheduled visit for further refills.  Patient stated that she would contact PCP for a refill on albuterol, as this was last prescribed by PCP.  Nothing further needed at this time.

## 2021-01-19 ENCOUNTER — Ambulatory Visit (INDEPENDENT_AMBULATORY_CARE_PROVIDER_SITE_OTHER): Payer: Medicare Other | Admitting: Family Medicine

## 2021-01-19 ENCOUNTER — Encounter: Payer: Self-pay | Admitting: Family Medicine

## 2021-01-19 ENCOUNTER — Other Ambulatory Visit: Payer: Self-pay

## 2021-01-19 VITALS — BP 132/78 | HR 100 | Temp 98.7°F | Resp 17 | Ht 59.0 in | Wt 160.0 lb

## 2021-01-19 DIAGNOSIS — R809 Proteinuria, unspecified: Secondary | ICD-10-CM | POA: Diagnosis not present

## 2021-01-19 DIAGNOSIS — E1129 Type 2 diabetes mellitus with other diabetic kidney complication: Secondary | ICD-10-CM

## 2021-01-19 DIAGNOSIS — Z1211 Encounter for screening for malignant neoplasm of colon: Secondary | ICD-10-CM | POA: Diagnosis not present

## 2021-01-19 DIAGNOSIS — N1831 Chronic kidney disease, stage 3a: Secondary | ICD-10-CM | POA: Diagnosis not present

## 2021-01-19 NOTE — Patient Instructions (Addendum)
Preventing Chronic Kidney Disease Chronic kidney disease (CKD) occurs when the kidneys are slowly and permanently damaged over a long period of time. The kidneys are two organs that do many important jobs in the body, including:  Removing waste and extra fluid from the blood to make urine.  Making hormones that maintain the amount of fluid in tissues and blood vessels.  Maintaining the right amount of fluids and electrolytes in the body. A small amount of kidney damage may not cause problems, but a large amount of damage may make it hard or impossible for the kidneys to work the way they should. CKD gets worse over time. You can take steps to prevent CKD or to keep it from getting worse. The best way to prevent kidney damage is to know your risk factors and make changes before you develop symptoms of CKD. How can this condition affect me? At first, you may not notice any signs or symptoms of CKD. Symptoms develop slowly and may not be obvious until the kidney damage becomes severe. If steps are not taken to prevent or slow down the disease, CKD can lead to:  A low red blood cell count (anemia).  Heart disease.  Weak bones.  Nerve damage.  Stroke.  Kidney failure and dialysis.  Changes in urination, such as less urine, more urine, or blood in the urine. What can increase my risk? You are more likely to develop CKD if you:  Are 36 years of age or older.  Are obese.  Have taken certain medicines for a long time.  Use tobacco or have used it in the past.  Have any of the following conditions: ? Diabetes. ? High blood pressure. ? Heart disease. ? Multiple myeloma. ? An autoimmune disease. ? Frequent urinary tract infections. ? Polycystic kidney disease. ? High cholesterol.  Have a family history of kidney disease, heart disease, diabetes, or high blood pressure.  Have problems with urine flow that may be caused by: ? Cancer. ? Having kidney stones more than once. ? An  enlarged prostate in males. What actions can I take to prevent CKD? Managing conditions that put you at risk  Talk to your health care provider about your kidney health and your risk factors for CKD.  Work with your health care provider to manage conditions such as high blood pressure, diabetes, or high cholesterol. This may involve taking medicines, eating healthy, or making lifestyle changes to help get the following measures down to the target that your health care provider recommends: ? Blood pressure. ? Blood sugar (glucose) levels. ? Cholesterol. Eating and drinking  Follow instructions from your health care provider about diet. This may include: ? Limiting salt (sodium) intake. You should have less than 1 tsp (2,300 mg) of sodium a day. If you have heart disease or high blood pressure, you should have less than  tsp (1,725 mg) of sodium a day. ? Limiting protein intake as told by your health care provider. Avoid high-protein foods. ? Eating a balanced, heart-healthy diet. ? Avoiding foods that are high in potassium and phosphorous.  Limit alcohol. If you drink alcohol: ? Limit how much you use to:  0-1 drink a day for women who are not pregnant.  0-2 drinks a day for men. ? Know how much alcohol is in your drink. In the [place] one drink equals one 12 oz bottle of beer (355 mL), one 5 oz glass of wine (148 mL), or one 1 oz glass of hard liquor (  44 mL).  If you have diabetes, work with a Financial planner (Firefighter) or a certified diabetes educator to develop a healthy eating plan.  Talk with your health care provider about how much fluid you should drink each day.   Lifestyle  Exercise for at least 30 minutes on 5 or more days of the week, or as much as told by your health care provider.  Keep your weight at a healthy level. If you are overweight or obese, lose weight as told by your health care provider.  Do not use any products that contain nicotine or  tobacco, such as cigarettes, e-cigarettes, and chewing tobacco. If you need help quitting, ask your health care provider.   General instructions  Take over-the-counter and prescription medicines only as told by your health care provider. Do not take any new medicines unless approved by your health care provider.  Use NSAIDs, such as ibuprofen, for pain only when necessary. Ask your health care provider about other pain medicines that do not increase your risk of developing CKD.  Have a yearly physical exam.  Learn about your family's medical history. Talk to your relatives and siblings about diabetes, heart disease, and high blood pressure. Where to find more information Learn more about CKD and how to prevent CKD from:  [place]: www.kidney.org  American Association of Kidney Patients: BombTimer.gl  American Diabetes Association: www.diabetes.org Summary  Symptoms of CKD develop slowly and may not be obvious until the kidney damage becomes severe.  The best way to prevent kidney damage is to know your risk factors and make nutrition and lifestyle changes before you develop symptoms of CKD.  Follow instructions from your health care provider about diet, which may include limiting how much salt, protein, and alcohol you consume.  Work with your health care provider to keep your blood pressure, cholesterol, and blood sugar levels within the recommended range. This information is not intended to replace advice given to you by your health care provider. Make sure you discuss any questions you have with your health care provider. Document Revised: 04/08/2020 Document Reviewed: 03/12/2020 Elsevier Patient Education  2021 [place]. Smoking Tobacco Information, Adult Smoking tobacco can be harmful to your health. Tobacco contains a poisonous (toxic), colorless chemical called nicotine. Nicotine is addictive. It changes the brain and can make it hard to stop smoking.  Tobacco also has other toxic chemicals that can hurt your body and raise your risk of many cancers. How can smoking tobacco affect me? Smoking tobacco puts you at risk for:  Cancer. Smoking is most commonly associated with lung cancer, but can also lead to cancer in other parts of the body.  Chronic obstructive pulmonary disease (COPD). This is a long-term lung condition that makes it hard to breathe. It also gets worse over time.  High blood pressure (hypertension), heart disease, stroke, or heart attack.  Lung infections, such as pneumonia.  Cataracts. This is when the lenses in the eyes become clouded.  Digestive problems. This may include peptic ulcers, heartburn, and gastroesophageal reflux disease (GERD).  Oral health problems, such as gum disease and tooth loss.  Loss of taste and smell. Smoking can affect your appearance by causing:  Wrinkles.  Yellow or stained teeth, fingers, and fingernails. Smoking tobacco can also affect your social life, because:  It may be challenging to find places to smoke when away from home. Many workplaces, [employer], hotels, and public places are tobacco-free.  Smoking is expensive. This is due to  the cost of tobacco and the long-term costs of treating health problems from smoking.  Secondhand smoke may affect those around you. Secondhand smoke can cause lung cancer, breathing problems, and heart disease. Children of smokers have a higher risk for: ? Sudden infant death syndrome (SIDS). ? Ear infections. ? Lung infections. If you currently smoke tobacco, quitting now can help you:  Lead a longer and healthier life.  Look, smell, breathe, and feel better over time.  Save money.  Protect others from the harms of secondhand smoke. What actions can I take to prevent health problems? Quit smoking  Do not start smoking. Quit if you already do.  Make a plan to quit smoking and commit to it. Look for programs to help you and ask your  health care provider for recommendations and ideas.  Set a date and write down all the reasons you want to quit.  Let your friends and family know you are quitting so they can help and support you. Consider finding friends who also want to quit. It can be easier to quit with someone else, so that you can support each other.  Talk with your health care provider about using nicotine replacement medicines to help you quit, such as gum, lozenges, patches, sprays, or pills.  Do not replace cigarette smoking with electronic cigarettes, which are commonly called e-cigarettes. The safety of e-cigarettes is not known, and some may contain harmful chemicals.  If you try to quit but return to smoking, stay positive. It is common to slip up when you first quit, so take it one day at a time.  Be prepared for cravings. When you feel the urge to smoke, chew gum or suck on hard candy.   Lifestyle  Stay busy and take care of your body.  Drink enough fluid to keep your urine pale yellow.  Get plenty of exercise and eat a healthy diet. This can help prevent weight gain after quitting.  Monitor your eating habits. Quitting smoking can cause you to have a larger appetite than when you smoke.  Find ways to relax. Go out with friends or family to a movie or a restaurant where people do not smoke.  Ask your health care provider about having regular tests (screenings) to check for cancer. This may include blood tests, imaging tests, and other tests.  Find ways to manage your stress, such as meditation, yoga, or exercise. Where to find support To get support to quit smoking, consider:  Asking your health care provider for more information and resources.  Taking classes to learn more about quitting smoking.  Looking for local organizations that offer resources about quitting smoking.  Joining a support group for people who want to quit smoking in your local community.  Calling the smokefree.gov counselor  helpline: 1-800-Quit-Now 2102183748) Where to find more information You may find more information about quitting smoking from:  HelpGuide.org: www.helpguide.org  https://hall.com/: smokefree.gov  American Lung Association: www.lung.org Contact a health care provider if you:  Have problems breathing.  Notice that your lips, nose, or fingers turn blue.  Have chest pain.  Are coughing up blood.  Feel faint or you pass out.  Have other health changes that cause you to worry. Summary  Smoking tobacco can negatively affect your health, the health of those around you, your finances, and your social life.  Do not start smoking. Quit if you already do. If you need help quitting, ask your health care provider.  Think about joining a support  group for people who want to quit smoking in your local community. There are many effective programs that will help you to quit this behavior. This information is not intended to replace advice given to you by your health care provider. Make sure you discuss any questions you have with your health care provider. Document Revised: 09/05/2019 Document Reviewed: 12/26/2016 Elsevier Patient Education  2021 [employer].

## 2021-02-11 NOTE — Progress Notes (Addendum)
Name: April Luna   MRN: [id number]    DOB: 11/30/1971   Date:02/14/2021       Progress Note  Subjective  Chief Complaint  Annual Exam  HPI  Patient presents for annual CPE.  Diet: she has been drinking sodas , not eating fast food, cooks at home  Exercise:   Flowsheet Row Clinical Support from 06/29/2020 in [facility]  AUDIT-C Score 0     Depression: Phq 9 is  Positive, she states usually happens this time of the year  Depression screen PHQ 2/9 02/14/2021 01/19/2021 11/03/2020 10/13/2020 06/29/2020  Decreased Interest 0 0 1 3 0  Down, Depressed, Hopeless 1 1 1 1 0  PHQ - 2 Score 1 1 2 4 0  Altered sleeping 1 0 2 1 -  Tired, decreased energy 1 3 2 3 -  Change in appetite 1 0 3 2 -  Feeling bad or failure about yourself  0 0 2 0 -  Trouble concentrating 2 2 3 2 -  Moving slowly or fidgety/restless 0 0 0 2 -  Suicidal thoughts 0 0 0 0 -  PHQ-9 Score 6 6 14 14 -  Difficult doing work/chores - - Somewhat difficult Very difficult -  Some recent data might be hidden   Hypertension: BP Readings from Last 3 Encounters:  02/14/21 126/80  01/19/21 132/78  11/03/20 118/70   Obesity: Wt Readings from Last 3 Encounters:  02/14/21 157 lb (71.2 kg)  01/19/21 160 lb (72.6 kg)  11/03/20 155 lb 9.6 oz (70.6 kg)   BMI Readings from Last 3 Encounters:  02/14/21 31.71 kg/m  01/19/21 32.32 kg/m  11/03/20 31.43 kg/m     Vaccines:   Shingrix: 50-64 yo and ask insurance if covered when patient above 65 yo Pneumonia: educated and discussed with patient. Flu: up to date COVID-19 vaccine counseled   Hep C Screening: 11/03/20 STD testing and prevention (HIV/chl/gon/syphilis): 04/11/18 Intimate partner violence: negative Sexual History : not currently , husband he has lack of libido and ED, but they have intimacy  Menstrual History/LMP/Abnormal Bleeding: post-menopausal since age 36  Incontinence Symptoms: urinary urgency , has incontinence and has seen  Urologist in the past - history of kidney stones.   Breast cancer:  - Last Mammogram: Ordered 03/30/20 advised her to call and make the appointment  - BRCA gene screening: father had colon cancer at age 55 , great-grandmother maternal side had breast cancer - not sure what age.   Osteoporosis: Discussed high calcium and vitamin D supplementation, weight bearing exercises  Cervical cancer screening: Done today  Skin cancer: Discussed monitoring for atypical lesions  Colorectal cancer: Ordered 02/11/21   Lung cancer: Low Dose CT Chest recommended if Age 50-80 years, 20 pack-year currently smoking OR have quit w/in 15years. Patient does not qualify.   ECG: 02/06/20  Advanced Care Planning: A voluntary discussion about advance care planning including the explanation and discussion of advance directives.  Discussed health care proxy and Living will, and the patient was able to identify a health care proxy as her husband   Lipids: Lab Results  Component Value Date   CHOL 231 (H) 11/03/2020   CHOL 272 (H) 04/21/2020   CHOL 286 (H) 06/24/2019   Lab Results  Component Value Date   HDL 42 (L) 11/03/2020   HDL 43 (L) 04/21/2020   HDL 44 (L) 06/24/2019   Lab Results  Component Value Date   LDLCALC 155 (H) 11/03/2020   LDLCALC 178 (  H) 04/21/2020   LDLCALC 189 (H) 06/24/2019   Lab Results  Component Value Date   TRIG 205 (H) 11/03/2020   TRIG 301 (H) 04/21/2020   TRIG 315 (H) 06/24/2019   Lab Results  Component Value Date   CHOLHDL 5.5 (H) 11/03/2020   CHOLHDL 6.3 (H) 04/21/2020   CHOLHDL 6.5 (H) 06/24/2019   No results found for: LDLDIRECT  Glucose: Glucose  Date Value Ref Range Status  04/20/2015 95 mg/dL Final    Comment:    65-99 NOTE: New Reference Range  03/02/15   04/06/2015 123 (H) mg/dL Final    Comment:    65-99 NOTE: New Reference Range  03/02/15   04/05/2015 111 (H) mg/dL Final    Comment:    65-99 NOTE: New Reference Range  03/02/15    Glucose, Bld   Date Value Ref Range Status  11/03/2020 95 65 - 99 mg/dL Final    Comment:    .            Fasting reference interval .   04/21/2020 112 (H) 65 - 99 mg/dL Final    Comment:    .            Fasting reference interval . For someone without known diabetes, a glucose value between 100 and 125 mg/dL is consistent with prediabetes and should be confirmed with a follow-up test. .   02/04/2020 105 (H) 70 - 99 mg/dL Final    Patient Active Problem List   Diagnosis Date Noted  . Plantar callus 10/06/2019  . Acquired hallux limitus of both feet 10/06/2019  . Morbid obesity ([place]) 06/24/2019  . Type 2 diabetes mellitus with microalbuminuria, without long-term current use of insulin ([place]) 06/24/2019  . Chronic obstructive pulmonary disease ([place]) 06/24/2019  . Chronic constipation 06/24/2019  . ASCUS of cervix with negative high risk HPV 09/05/2018  . GERD without esophagitis 04/11/2018  . Hyperlipidemia 04/11/2018  . Hypertension 04/11/2018  . Hypothyroidism 04/26/2015  . Bipolar I disorder, most recent episode (or current) manic ([place]) 04/25/2015  . Delirium due to another medical condition 04/25/2015  . Cannabis abuse 04/25/2015    Past Surgical History:  Procedure Laterality Date  . CESAREAN SECTION    . CYSTOSCOPY W/ RETROGRADES Left 11/08/2018   Procedure: CYSTOSCOPY WITH RETROGRADE PYELOGRAM;  Surgeon: Billey Co, MD;  Location: [facility];  Service: Urology;  Laterality: Left;  . CYSTOSCOPY/URETEROSCOPY/HOLMIUM LASER/STENT PLACEMENT Left 11/08/2018   Procedure: CYSTOSCOPY/URETEROSCOPY/HOLMIUM LASER/STENT PLACEMENT;  Surgeon: Billey Co, MD;  Location: [facility];  Service: Urology;  Laterality: Left;  . MOUTH SURGERY     wisdom teeth extraction    Family History  Problem Relation Age of Onset  . Depression Mother   . Anxiety disorder Mother   . Diabetes Mother   . Hypertension Mother   . Hyperlipidemia Mother   . Cancer Mother   . Cancer Father         colon cancer  . Depression Brother   . Anxiety disorder Brother   . Diabetes Mellitus II Maternal Grandmother   . Hypercholesterolemia Maternal Grandmother   . Cancer Paternal Grandmother   . Diabetes Paternal Grandmother     Social History   Socioeconomic History  . Marital status: Married    Spouse name: Montine Circle  . Number of children: 1  . Years of education: 19  . Highest education level: High school graduate  Occupational History  . Occupation: unemployed    Comment: disabled  Tobacco Use  . Smoking status: Current Every Day Smoker    Packs/day: 0.75    Years: 34.00    Pack years: 25.50    Types: Cigarettes    Start date: 05/13/1985  . Smokeless tobacco: Never Used  . Tobacco comment: using nicotrol, trying to quit   Vaping Use  . Vaping Use: Former  . Start date: 05/25/2018  . Quit date: 09/24/2018  Substance and Sexual Activity  . Alcohol use: Not Currently    Alcohol/week: 0.0 standard drinks    Comment: rarely  . Drug use: Not Currently    Types: Marijuana    Comment: none since August 2019  . Sexual activity: Not Currently    Birth control/protection: Post-menopausal  Other Topics Concern  . Not on file  Social History Narrative  . Not on file   Social Determinants of Health   Financial Resource Strain: Low Risk   . Difficulty of Paying Living Expenses: Not hard at all  Food Insecurity: No Food Insecurity  . Worried About Running Out of Food in the Last Year: Never true  . Ran Out of Food in the Last Year: Never true  Transportation Needs: Unmet Transportation Needs  . Lack of Transportation (Medical): Yes  . Lack of Transportation (Non-Medical): Yes  Physical Activity: Insufficiently Active  . Days of Exercise per Week: 2 days  . Minutes of Exercise per Session: 20 min  Stress: No Stress Concern Present  . Feeling of Stress : Only a little  Social Connections: Moderately Isolated  . Frequency of Communication with Friends and Family: More  than three times a week  . Frequency of Social Gatherings with Friends and Family: Twice a week  . Attends Religious Services: Never  . Active Member of Clubs or Organizations: No  . Attends Club or Organization Meetings: Never  . Marital Status: Married  Intimate Partner Violence: Not At Risk  . Fear of Current or Ex-Partner: No  . Emotionally Abused: No  . Physically Abused: No  . Sexually Abused: No     Current Outpatient Medications:  .  albuterol (PROVENTIL) (2.5 MG/3ML) 0.083% nebulizer solution, Take 3 mLs (2.5 mg total) by nebulization every 6 (six) hours as needed for wheezing or shortness of breath., Disp: 150 mL, Rfl: 1 .  amantadine (SYMMETREL) 100 MG capsule, Take 100 mg by mouth 2 (two) times daily., Disp: , Rfl:  .  blood glucose meter kit and supplies, Dispense based on patient and insurance preference. Use up to four times daily as directed. (FOR ICD-10 E10.9, E11.9)., Disp: 1 each, Rfl: 0 .  buPROPion (WELLBUTRIN XL) 150 MG 24 hr tablet, Take 150 mg by mouth daily., Disp: , Rfl:  .  Cholecalciferol (VITAMIN D-3) 125 MCG (5000 UT) TABS, Take 5,000 Units by mouth daily., Disp: 30 tablet, Rfl: 1 .  Fluticasone-Umeclidin-Vilant (TRELEGY ELLIPTA) 100-62.5-25 MCG/INH AEPB, Inhale 1 puff into the lungs daily., Disp: 60 each, Rfl: 0 .  gabapentin (NEURONTIN) 300 MG capsule, Take 300 mg by mouth 2 (two) times daily., Disp: , Rfl:  .  hydrOXYzine (VISTARIL) 50 MG capsule, Take 50 mg by mouth 2 (two) times daily as needed for anxiety., Disp: , Rfl:  .  INVEGA SUSTENNA 234 MG/1.5ML SUSY injection, Inject into the muscle. Inject as directed once a month, Disp: , Rfl:  .  levothyroxine (SYNTHROID) 75 MCG tablet, TAKE 1 TABLET BY MOUTH DAILY AT 6 AM, Disp: 90 tablet, Rfl: 0 .  losartan (COZAAR) 50 MG tablet, Take   1 tablet (50 mg total) by mouth daily., Disp: 90 tablet, Rfl: 1 .  lubiprostone (AMITIZA) 24 MCG capsule, Take 1 capsule (24 mcg total) by mouth 2 (two) times daily with a  meal., Disp: 180 capsule, Rfl: 0 .  malathion (OVIDE) 0.5 % lotion, Apply topically., Disp: , Rfl:  .  nicotine (NICOTROL) 10 MG inhaler, Inhale 1 Cartridge (1 continuous puffing total) into the lungs as needed for smoking cessation., Disp: 36 each, Rfl: 3 .  oxybutynin (DITROPAN-XL) 10 MG 24 hr tablet, TAKE 1 TABLET BY MOUTH EACH DAY, Disp: 90 tablet, Rfl: 3 .  pantoprazole (PROTONIX) 40 MG tablet, Take 1 tablet (40 mg total) by mouth daily., Disp: 90 tablet, Rfl: 1 .  prazosin (MINIPRESS) 2 MG capsule, Take 2 mg by mouth at bedtime., Disp: , Rfl:  .  rosuvastatin (CRESTOR) 40 MG tablet, TAKE 1 TABLET BY MOUTH DAILY, Disp: 30 tablet, Rfl: 5 .  sertraline (ZOLOFT) 100 MG tablet, Take 200 mg by mouth daily., Disp: , Rfl:  .  VASCEPA 1 g capsule, TAKE 2 CAPSULES BY MOUTH TWICE A DAY, Disp: 120 capsule, Rfl: 5  Allergies  Allergen Reactions  . Metformin And Related Nausea And Vomiting  . Penicillins Other (See Comments)    She has taken amoxicillin without problems  . Perphenazine Other (See Comments)    Tremors, muscle weakness, tongue swelling  . Sulfa Antibiotics Other (See Comments)    GI distress   . Abilify [Aripiprazole] Rash     ROS  Constitutional: Negative for fever or weight change.  Respiratory: Negative for cough and shortness of breath.   Cardiovascular: Positive for intermittent  abdominal pain - needs to see urologist - she will schedule follow up, no bowel changes.  Musculoskeletal: Negative for gait problem or joint swelling.  Skin: Negative for rash.  Neurological: Negative for dizziness or headache.  No other specific complaints in a complete review of systems (except as listed in HPI above).  Objective  Vitals:   02/14/21 1354  BP: 126/80  Pulse: 99  Resp: 16  Temp: 99 F (37.2 C)  TempSrc: Oral  SpO2: 94%  Weight: 157 lb (71.2 kg)  Height: 4' 11" (1.499 m)    Body mass index is 31.71 kg/m.  Physical Exam  Constitutional: Patient appears  well-developed and obesity. No distress.  HENT: Head: Normocephalic and atraumatic. Ears: B TMs ok, no erythema or effusion; Nose: Not done  Mouth/Throat: not done   Eyes: Conjunctivae and EOM are normal. Pupils are equal, round, and reactive to light. No scleral icterus.  Neck: Normal range of motion. Neck supple. No JVD present. No thyromegaly present.  Cardiovascular: Normal rate, regular rhythm and normal heart sounds.  No murmur heard. No BLE edema. Pulmonary/Chest: Effort normal and breath sounds normal. No respiratory distress. Abdominal: Soft. Bowel sounds are normal, no distension. There is no tenderness. no masses Breast: no lumps or masses, no nipple discharge or rashes FEMALE GENITALIA:  External genitalia normal External urethra normal Vaginal vault normal without discharge or lesions Cervix normal without discharge or lesions Bimanual exam normal without masses RECTAL: not done  Musculoskeletal: Normal range of motion, no joint effusions. No gross deformities Neurological: he is alert and oriented to person, place, and time. No cranial nerve deficit. Coordination, balance, strength, speech and gait are normal.  Skin: Skin is warm and dry. No rash noted. No erythema.  Psychiatric: Patient has a normal mood and affect. behavior is normal. Judgment and thought content normal.  Fall Risk: Fall Risk  02/14/2021 01/19/2021 11/03/2020 11/03/2020 10/13/2020  Falls in the past year? 0 0 0 0 0  Number falls in past yr: 0 0 0 0 0  Injury with Fall? 0 0 0 0 0  Risk for fall due to : - - - - -  Follow up - - - - -     Functional Status Survey: Is the patient deaf or have difficulty hearing?: Yes Does the patient have difficulty seeing, even when wearing glasses/contacts?: No Does the patient have difficulty concentrating, remembering, or making decisions?: Yes Does the patient have difficulty walking or climbing stairs?: No Does the patient have difficulty dressing or bathing?:  No Does the patient have difficulty doing errands alone such as visiting a doctor's office or shopping?: No   Assessment & Plan  1. Well adult exam   2. Cervical cancer screening  - Cytology - PAP  3. Breast cancer screening by mammogram  - MM 3D SCREEN BREAST BILATERAL; Future  4. Need for shingles vaccine  - Varicella-zoster vaccine IM -USPSTF grade A and B recommendations reviewed with patient; age-appropriate recommendations, preventive care, screening tests, etc discussed and encouraged; healthy living encouraged; see AVS for patient education given to patient -Discussed importance of 150 minutes of physical activity weekly, eat two servings of fish weekly, eat one serving of tree nuts ( cashews, pistachios, pecans, almonds.Marland Kitchen) every other day, eat 6 servings of fruit/vegetables daily and drink plenty of water and avoid sweet beverages.

## 2021-02-14 ENCOUNTER — Other Ambulatory Visit (HOSPITAL_COMMUNITY)
Admission: RE | Admit: 2021-02-14 | Discharge: 2021-02-14 | Disposition: A | Discharge status: Home / Self Care | Payer: Medicare Other | Source: Ambulatory Visit | Attending: Family Medicine | Admitting: Family Medicine

## 2021-02-14 ENCOUNTER — Encounter: Payer: Self-pay | Admitting: Family Medicine

## 2021-02-14 ENCOUNTER — Ambulatory Visit (INDEPENDENT_AMBULATORY_CARE_PROVIDER_SITE_OTHER): Payer: Medicare Other | Admitting: Family Medicine

## 2021-02-14 ENCOUNTER — Other Ambulatory Visit: Payer: Self-pay

## 2021-02-14 VITALS — BP 126/80 | HR 99 | Temp 99.0°F | Resp 16 | Ht 59.0 in | Wt 157.0 lb

## 2021-02-14 DIAGNOSIS — Z23 Encounter for immunization: Secondary | ICD-10-CM

## 2021-02-14 DIAGNOSIS — Z1151 Encounter for screening for human papillomavirus (HPV): Secondary | ICD-10-CM | POA: Diagnosis not present

## 2021-02-14 DIAGNOSIS — Z0000 Encounter for general adult medical examination without abnormal findings: Secondary | ICD-10-CM | POA: Diagnosis not present

## 2021-02-14 DIAGNOSIS — Z1211 Encounter for screening for malignant neoplasm of colon: Secondary | ICD-10-CM

## 2021-02-14 DIAGNOSIS — Z124 Encounter for screening for malignant neoplasm of cervix: Secondary | ICD-10-CM

## 2021-02-14 DIAGNOSIS — Z1231 Encounter for screening mammogram for malignant neoplasm of breast: Secondary | ICD-10-CM | POA: Diagnosis not present

## 2021-02-14 NOTE — Addendum Note (Signed)
Addended by: Loistine Chance on: 02/14/2021 02:47 PM   Modules accepted: Orders

## 2021-02-14 NOTE — Patient Instructions (Signed)
Preventive Care 84-49 Years Old, Female Preventive care refers to lifestyle choices and visits with your health care provider that can promote health and wellness. This includes:  A yearly physical exam. This is also called an annual wellness visit.  Regular dental and eye exams.  Immunizations.  Screening for certain conditions.  Healthy lifestyle choices, such as: ? Eating a healthy diet. ? Getting regular exercise. ? Not using drugs or products that contain nicotine and tobacco. ? Limiting alcohol use. What can I expect for my preventive care visit? Physical exam Your health care provider will check your:  Height and weight. These may be used to calculate your BMI (body mass index). BMI is a measurement that tells if you are at a healthy weight.  Heart rate and blood pressure.  Body temperature.  Skin for abnormal spots. Counseling Your health care provider may ask you questions about your:  Past medical problems.  Family's medical history.  Alcohol, tobacco, and drug use.  Emotional well-being.  Home life and relationship well-being.  Sexual activity.  Diet, exercise, and sleep habits.  Work and work Statistician.  Access to firearms.  Method of birth control.  Menstrual cycle.  Pregnancy history. What immunizations do I need? Vaccines are usually given at various ages, according to a schedule. Your health care provider will recommend vaccines for you based on your age, medical history, and lifestyle or other factors, such as travel or where you work.   What tests do I need? Blood tests  Lipid and cholesterol levels. These may be checked every 5 years, or more often if you are over 3 years old.  Hepatitis C test.  Hepatitis B test. Screening  Lung cancer screening. You may have this screening every year starting at age 73 if you have a 30-pack-year history of smoking and currently smoke or have quit within the past 15 years.  Colorectal cancer  screening. ? All adults should have this screening starting at age 52 and continuing until age 17. ? Your health care provider may recommend screening at age 49 if you are at increased risk. ? You will have tests every 1-10 years, depending on your results and the type of screening test.  Diabetes screening. ? This is done by checking your blood sugar (glucose) after you have not eaten for a while (fasting). ? You may have this done every 1-3 years.  Mammogram. ? This may be done every 1-2 years. ? Talk with your health care provider about when you should start having regular mammograms. This may depend on whether you have a family history of breast cancer.  BRCA-related cancer screening. This may be done if you have a family history of breast, ovarian, tubal, or peritoneal cancers.  Pelvic exam and Pap test. ? This may be done every 3 years starting at age 10. ? Starting at age 11, this may be done every 5 years if you have a Pap test in combination with an HPV test. Other tests  STD (sexually transmitted disease) testing, if you are at risk.  Bone density scan. This is done to screen for osteoporosis. You may have this scan if you are at high risk for osteoporosis. Talk with your health care provider about your test results, treatment options, and if necessary, the need for more tests. Follow these instructions at home: Eating and drinking  Eat a diet that includes fresh fruits and vegetables, whole grains, lean protein, and low-fat dairy products.  Take vitamin and mineral supplements  as recommended by your health care provider.  Do not drink alcohol if: ? Your health care provider tells you not to drink. ? You are pregnant, may be pregnant, or are planning to become pregnant.  If you drink alcohol: ? Limit how much you have to 0-1 drink a day. ? Be aware of how much alcohol is in your drink. In the [place] one drink equals one 12 oz bottle of beer (355 mL), one 5 oz glass of  wine (148 mL), or one 1 oz glass of hard liquor (44 mL).   Lifestyle  Take daily care of your teeth and gums. Brush your teeth every morning and night with fluoride toothpaste. Floss one time each day.  Stay active. Exercise for at least 30 minutes 5 or more days each week.  Do not use any products that contain nicotine or tobacco, such as cigarettes, e-cigarettes, and chewing tobacco. If you need help quitting, ask your health care provider.  Do not use drugs.  If you are sexually active, practice safe sex. Use a condom or other form of protection to prevent STIs (sexually transmitted infections).  If you do not wish to become pregnant, use a form of birth control. If you plan to become pregnant, see your health care provider for a prepregnancy visit.  If told by your health care provider, take low-dose aspirin daily starting at age 50.  Find healthy ways to cope with stress, such as: ? Meditation, yoga, or listening to music. ? Journaling. ? Talking to a trusted person. ? Spending time with friends and family. Safety  Always wear your seat belt while driving or riding in a vehicle.  Do not drive: ? If you have been drinking alcohol. Do not ride with someone who has been drinking. ? When you are tired or distracted. ? While texting.  Wear a helmet and other protective equipment during sports activities.  If you have firearms in your house, make sure you follow all gun safety procedures. What's next?  Visit your health care provider once a year for an annual wellness visit.  Ask your health care provider how often you should have your eyes and teeth checked.  Stay up to date on all vaccines. This information is not intended to replace advice given to you by your health care provider. Make sure you discuss any questions you have with your health care provider. Document Revised: 09/14/2020 Document Reviewed: 08/22/2018 Elsevier Patient Education  2021 [employer]  

## 2021-02-16 LAB — CYTOLOGY - PAP
Comment: NEGATIVE
Diagnosis: NEGATIVE
High risk HPV: NEGATIVE

## 2021-02-24 ENCOUNTER — Encounter: Payer: Self-pay | Admitting: Pulmonary Disease

## 2021-02-24 ENCOUNTER — Ambulatory Visit: Payer: Medicare Other | Admitting: Pulmonary Disease

## 2021-02-24 ENCOUNTER — Other Ambulatory Visit: Payer: Self-pay

## 2021-02-24 VITALS — BP 128/78 | HR 80 | Temp 97.5°F | Ht 59.0 in | Wt 155.0 lb

## 2021-02-24 DIAGNOSIS — F17210 Nicotine dependence, cigarettes, uncomplicated: Secondary | ICD-10-CM

## 2021-02-24 DIAGNOSIS — R0683 Snoring: Secondary | ICD-10-CM

## 2021-02-24 DIAGNOSIS — G4700 Insomnia, unspecified: Secondary | ICD-10-CM | POA: Diagnosis not present

## 2021-02-24 DIAGNOSIS — J449 Chronic obstructive pulmonary disease, unspecified: Secondary | ICD-10-CM

## 2021-02-24 MED ORDER — BREZTRI AEROSPHERE 160-9-4.8 MCG/ACT IN AERO: 2.0000 | INHALATION_SPRAY | Freq: Two times a day (BID) | RESPIRATORY_TRACT | 0 refills | Status: DC

## 2021-02-24 NOTE — Progress Notes (Signed)
Subjective:    Patient ID: April Luna, female    DOB: 01/24/1971, 50 y.o.   MRN: [id number]  HPI Patient is a 50 year old current smoker (1 PPD) who presents for follow-up on the issue of COPD and dyspnea. Initially seen here on 08 December 2019, a 39-monthfollow-up with PFTs was requested after switching her to [employer] She never followed. She has however continued on Trelegy Ellipta and feels that this was helping her somewhat. She continues to have issues with dyspnea on exertion and cough productive of whitish sputum mostly in the mornings. She has had difficulties committing to tobacco cessation but is now rethinking it. She prefers Nicotrol Inhaler as her method of quitting. She states she still has the prescription in some of the Nicotrol inhalers. She does note some increasing shortness of breath over the last several days but no fever, chills or sweats. No change in the character of her cough or sputum. No hemoptysis. She is reluctant to get COVID-19 vaccine. Though she has done relatively well with the Trelegy Ellipta she feels that this does not last her the full day.  On review of systems it appears that she is having some issues with nocturnal awakenings. She has noted some loud snoring that actually wakes her up. Has not been told that she has apneic spells.  She voices no other complaint today.  She has not had any recent ED visits for COPD or other issues.   Review of Systems A 10 point review of systems was performed and it is as noted above otherwise negative.  Patient Active Problem List   Diagnosis Date Noted  . Plantar callus 10/06/2019  . Acquired hallux limitus of both feet 10/06/2019  . Morbid obesity ([place] 06/24/2019  . Type 2 diabetes mellitus with microalbuminuria, without long-term current use of insulin ([place] 06/24/2019  . Chronic obstructive pulmonary disease ([place] 06/24/2019  . Chronic constipation 06/24/2019  . ASCUS of cervix with negative  high risk HPV 09/05/2018  . GERD without esophagitis 04/11/2018  . Hyperlipidemia 04/11/2018  . Hypertension 04/11/2018  . Hypothyroidism 04/26/2015  . Bipolar I disorder, most recent episode (or current) manic ([place] 04/25/2015  . Delirium due to another medical condition 04/25/2015  . Cannabis abuse 04/25/2015   . Social History   Tobacco Use  . Smoking status: Current Every Day Smoker    Packs/day: 1.50    Years: 34.00    Pack years: 51.00    Types: Cigarettes    Start date: 05/13/1985  . Smokeless tobacco: Never Used  . Tobacco comment: 1 PPD 02/24/2021  Substance Use Topics  . Alcohol use: Not Currently    Alcohol/week: 0.0 standard drinks    Comment: rarely   Allergies  Allergen Reactions  . Metformin And Related Nausea And Vomiting  . Penicillins Other (See Comments)    She has taken amoxicillin without problems  . Perphenazine Other (See Comments)    Tremors, muscle weakness, tongue swelling  . Sulfa Antibiotics Other (See Comments)    GI distress   . Abilify [Aripiprazole] Rash   Current Meds  Medication Sig  . albuterol (PROVENTIL) (2.5 MG/3ML) 0.083% nebulizer solution Take 3 mLs (2.5 mg total) by nebulization every 6 (six) hours as needed for wheezing or shortness of breath.  .Marland Kitchenamantadine (SYMMETREL) 100 MG capsule Take 100 mg by mouth 2 (two) times daily.  . blood glucose meter kit and supplies Dispense based on patient and insurance preference. Use up to four times  daily as directed. (FOR ICD-10 E10.9, E11.9).  Marland Kitchen buPROPion (WELLBUTRIN XL) 150 MG 24 hr tablet Take 150 mg by mouth daily.  . Cholecalciferol (VITAMIN D-3) 125 MCG (5000 UT) TABS Take 5,000 Units by mouth daily.  . Fluticasone-Umeclidin-Vilant (TRELEGY ELLIPTA) 100-62.5-25 MCG/INH AEPB Inhale 1 puff into the lungs daily.  Marland Kitchen gabapentin (NEURONTIN) 300 MG capsule Take 300 mg by mouth 2 (two) times daily.  . hydrOXYzine (VISTARIL) 50 MG capsule Take 50 mg by mouth 2 (two) times daily as needed for  anxiety.  Lorayne Bender SUSTENNA 234 MG/1.5ML SUSY injection Inject into the muscle. Inject as directed once a month  . levothyroxine (SYNTHROID) 75 MCG tablet TAKE 1 TABLET BY MOUTH DAILY AT 6 AM  . losartan (COZAAR) 50 MG tablet Take 1 tablet (50 mg total) by mouth daily.  Marland Kitchen lubiprostone (AMITIZA) 24 MCG capsule Take 1 capsule (24 mcg total) by mouth 2 (two) times daily with a meal.  . malathion (OVIDE) 0.5 % lotion Apply topically.  Marland Kitchen oxybutynin (DITROPAN-XL) 10 MG 24 hr tablet TAKE 1 TABLET BY MOUTH EACH DAY  . pantoprazole (PROTONIX) 40 MG tablet Take 1 tablet (40 mg total) by mouth daily.  . prazosin (MINIPRESS) 2 MG capsule Take 2 mg by mouth at bedtime.  . rosuvastatin (CRESTOR) 40 MG tablet TAKE 1 TABLET BY MOUTH DAILY  . sertraline (ZOLOFT) 100 MG tablet Take 200 mg by mouth daily.  Marland Kitchen VASCEPA 1 g capsule TAKE 2 CAPSULES BY MOUTH TWICE A DAY   Immunization History  Administered Date(s) Administered  . Influenza,inj,Quad PF,6+ Mos 09/02/2018, 09/24/2019, 11/03/2020  . PPD Test 05/10/2010  . Pneumococcal Polysaccharide-23 05/14/2018  . Tdap 06/30/2015  . Zoster Recombinat (Shingrix) 02/14/2021        Objective:   Physical Exam BP 128/78 (BP Location: Left Arm, Cuff Size: Normal)   Pulse 80   Temp (!) 97.5 F (36.4 C) (Temporal)   Ht '4\' 11"'  (1.499 m)   Wt 155 lb (70.3 kg)   SpO2 96%   BMI 31.31 kg/m  GENERAL: Well-developed, overweight woman, no acute distress. No conversational dyspnea. HEAD: Normocephalic, atraumatic.  EYES: Pupils equal, round, reactive to light.  No scleral icterus.  MOUTH: Nose/mouth/throat not examined due to masking requirements for COVID 19. NECK: Supple. No thyromegaly. Trachea midline. No JVD.  No adenopathy. PULMONARY: Good air entry bilaterally. She has end expiratory wheezes throughout. Moving air well despite wheezing. No rales or rhonchi noted. CARDIOVASCULAR: S1 and S2. Regular rate and rhythm.  ABDOMEN: Protuberant otherwise  benign. MUSCULOSKELETAL: No joint deformity, no clubbing, no edema.  NEUROLOGIC: No focal deficit, no gait disturbance, speech is fluent. SKIN: Intact,warm,dry. On limited exam no rashes. PSYCH: Somewhat anxious mood, normal behavior.     Assessment & Plan:     ICD-10-CM   1. Chronic obstructive pulmonary disease, unspecified COPD type ([place])  J44.9 Pulmonary Function Test [facility] Only   Does not appear that she is well compensated Will obtain PFTs Switch Trelegy to [facility], samples provided   2. Loud snoring  R06.83 Home sleep test   Home sleep study High sleep apnea suspect  3. Frequent nocturnal awakening  G47.00 Home sleep test   As above Home sleep study  4. Tobacco dependence due to cigarettes  F17.210    Patient was counseled regards discontinuation of smoking She is to use Nicotrol for smoking cessation Enroll in lung cancer screening program at age 68    Meds ordered this encounter  Medications  . Budeson-Glycopyrrol-Formoterol (  BREZTRI AEROSPHERE) 160-9-4.8 MCG/ACT AERO    Sig: Inhale 2 puffs into the lungs in the morning and at bedtime.    Dispense:  5.9 g    Refill:  0    Order Specific Question:   Lot Number?    Answer:   [id number]    Order Specific Question:   Expiration Date?    Answer:   07/25/2022    Order Specific Question:   Manufacturer?    Answer:   [employer] [[id number]]    Order Specific Question:   Quantity    Answer:   2   Orders Placed This Encounter  Procedures  . Pulmonary Function Test [facility] Only    Standing Status:   Future    Standing Expiration Date:   02/24/2022    Scheduling Instructions:     4 weeks    Order Specific Question:   Full PFT: includes the following: basic spirometry, spirometry pre & post bronchodilator, diffusion capacity (DLCO), lung volumes    Answer:   Full PFT  . Home sleep test    Standing Status:   Future    Standing Expiration Date:   02/24/2022    Order Specific Question:   Where should this test be performed:    Answer:    [facility] - Pulmonary   C. Derrill Kay, MD [facility]   *This note was dictated using voice recognition software/Dragon.  Despite best efforts to proofread, errors can occur which can change the meaning.  Any change was purely unintentional.

## 2021-02-24 NOTE — Patient Instructions (Signed)
We will get breathing tests.  We are also setting up a home sleep study.   We are switching you to [facility] 2 puffs twice a day.  DO NOT USE THE TRELEGY WHILE ON THE BREZTRI.  Let us know how the Judithann Sauger does for you so we can call the prescription in if it works.  We will send in a prescription for your rescue inhaler (albuterol).   We will see you in follow-up in 3 months time please call sooner should any new problems arise.  We will call you the results of the tests as they are resulted.

## 2021-03-01 ENCOUNTER — Encounter: Payer: Self-pay | Admitting: Pulmonary Disease

## 2021-03-08 ENCOUNTER — Telehealth: Payer: Self-pay | Admitting: Pulmonary Disease

## 2021-03-08 MED ORDER — BREZTRI AEROSPHERE 160-9-4.8 MCG/ACT IN AERO: 2.0000 | INHALATION_SPRAY | Freq: Two times a day (BID) | RESPIRATORY_TRACT | 11 refills | Status: DC

## 2021-03-08 NOTE — Telephone Encounter (Signed)
Rx for Breztri has been sent to preferred pharmacy.  Patient is aware and voiced her understanding.  Nothing further needed at this time.   

## 2021-03-08 NOTE — Telephone Encounter (Signed)
When I called the patient to setup a time for her to pick up the HST machine she wanted me to let you know that the inhalers that was given to her is working and would like Rx sent to [facility] on [address].

## 2021-03-09 ENCOUNTER — Other Ambulatory Visit: Payer: Self-pay | Admitting: Family Medicine

## 2021-03-09 ENCOUNTER — Ambulatory Visit: Payer: Medicare Other

## 2021-03-09 DIAGNOSIS — G4733 Obstructive sleep apnea (adult) (pediatric): Secondary | ICD-10-CM | POA: Diagnosis not present

## 2021-03-09 DIAGNOSIS — E032 Hypothyroidism due to medicaments and other exogenous substances: Secondary | ICD-10-CM

## 2021-03-09 DIAGNOSIS — R0683 Snoring: Secondary | ICD-10-CM

## 2021-03-09 DIAGNOSIS — G4700 Insomnia, unspecified: Secondary | ICD-10-CM

## 2021-03-10 ENCOUNTER — Ambulatory Visit: Payer: Medicare Other

## 2021-03-14 ENCOUNTER — Telehealth: Payer: Self-pay

## 2021-03-14 NOTE — Telephone Encounter (Signed)
Patient is aware of date/time of covid test prior to PFT.  

## 2021-03-16 ENCOUNTER — Other Ambulatory Visit: Admission: RE | Admit: 2021-03-16 | Payer: Medicare Other | Source: Ambulatory Visit

## 2021-03-16 DIAGNOSIS — G4733 Obstructive sleep apnea (adult) (pediatric): Secondary | ICD-10-CM | POA: Diagnosis not present

## 2021-03-21 ENCOUNTER — Encounter: Payer: Self-pay | Admitting: Gastroenterology

## 2021-03-21 ENCOUNTER — Other Ambulatory Visit: Payer: Self-pay

## 2021-03-21 ENCOUNTER — Ambulatory Visit (INDEPENDENT_AMBULATORY_CARE_PROVIDER_SITE_OTHER): Payer: Medicare Other | Admitting: Gastroenterology

## 2021-03-21 VITALS — BP 127/81 | HR 92 | Temp 98.7°F | Ht 59.0 in | Wt 158.2 lb

## 2021-03-21 DIAGNOSIS — K529 Noninfective gastroenteritis and colitis, unspecified: Secondary | ICD-10-CM | POA: Diagnosis not present

## 2021-03-21 MED ORDER — NA SULFATE-K SULFATE-MG SULF 17.5-3.13-1.6 GM/177ML PO SOLN: 354.0000 mL | Freq: Once | ORAL | 0 refills | Status: AC

## 2021-03-21 NOTE — Progress Notes (Signed)
[name], MD [address]  [place]  [place],  [address]  Main: [phone]  Fax: [phone]    Gastroenterology Consultation  Referring Provider:     Steele Sizer, MD Primary Care Physician:  Steele Sizer, MD Primary Gastroenterologist:  Dr. [name] Reason for Consultation:     Chronic diarrhea, abdominal bloating        HPI:   April Luna is a 50 y.o. female referred by Dr. Steele Sizer, MD  for consultation & management of chronic diarrhea and abdominal bloating.  Patient reports that she has been experiencing several weeks of nonbloody diarrhea, 3-4 times daily and sometimes nocturnal diarrhea as well.  She does report severe abdominal bloating.  She does acknowledge drinking sweet tea daily.  She denies any weight loss.  Apparently, she has history of chronic constipation for which she has been on Amitiza 24 MCG, however, she has been taking only once a day because it leads to diarrhea.  History of metabolic syndrome, hypothyroidism, prediabetes.  Her TSH is normal.  No evidence of anemia.  She is also here to discuss about colonoscopy She does smoke 1 pack/day, since age of 42  NSAIDs: None  Antiplts/Anticoagulants/Anti thrombotics: None  GI Procedures: None Father with colon cancer in his 72s  Past Medical History:  Diagnosis Date  . Allergy    pollen  . Anxiety   . Bipolar 1 disorder ([place])   . COPD (chronic obstructive pulmonary disease) ([place])   . Depression   . Family history of adverse reaction to anesthesia    grand father had a stroke during anesthesia  . GERD (gastroesophageal reflux disease)   . History of kidney stones   . Hyperlipidemia   . Hypertension   . Hypothyroidism   . Pneumonia   . Type 2 diabetes mellitus with microalbuminuria, without long-term current use of insulin ([place]) 06/24/2019    Past Surgical History:  Procedure Laterality Date  . CESAREAN SECTION    . CYSTOSCOPY W/ RETROGRADES Left  11/08/2018   Procedure: CYSTOSCOPY WITH RETROGRADE PYELOGRAM;  Surgeon: Billey Co, MD;  Location: [facility] ORS;  Service: Urology;  Laterality: Left;  . CYSTOSCOPY/URETEROSCOPY/HOLMIUM LASER/STENT PLACEMENT Left 11/08/2018   Procedure: CYSTOSCOPY/URETEROSCOPY/HOLMIUM LASER/STENT PLACEMENT;  Surgeon: Billey Co, MD;  Location: [facility] ORS;  Service: Urology;  Laterality: Left;  . MOUTH SURGERY     wisdom teeth extraction    Current Outpatient Medications:  .  albuterol (PROVENTIL) (2.5 MG/3ML) 0.083% nebulizer solution, Take 3 mLs (2.5 mg total) by nebulization every 6 (six) hours as needed for wheezing or shortness of breath., Disp: 150 mL, Rfl: 1 .  amantadine (SYMMETREL) 100 MG capsule, Take 100 mg by mouth 2 (two) times daily., Disp: , Rfl:  .  blood glucose meter kit and supplies, Dispense based on patient and insurance preference. Use up to four times daily as directed. (FOR ICD-10 E10.9, E11.9)., Disp: 1 each, Rfl: 0 .  Budeson-Glycopyrrol-Formoterol (BREZTRI AEROSPHERE) 160-9-4.8 MCG/ACT AERO, Inhale 2 puffs into the lungs in the morning and at bedtime., Disp: 5.9 g, Rfl: 11 .  buPROPion (WELLBUTRIN XL) 150 MG 24 hr tablet, Take 150 mg by mouth daily., Disp: , Rfl:  .  Cholecalciferol (VITAMIN D-3) 125 MCG (5000 UT) TABS, Take 5,000 Units by mouth daily., Disp: 30 tablet, Rfl: 1 .  gabapentin (NEURONTIN) 300 MG capsule, Take 300 mg by mouth 2 (two) times daily., Disp: , Rfl:  .  hydrOXYzine (VISTARIL) 50 MG capsule,  Take 50 mg by mouth 2 (two) times daily as needed for anxiety., Disp: , Rfl:  .  INVEGA SUSTENNA 234 MG/1.5ML SUSY injection, Inject into the muscle. Inject as directed once a month, Disp: , Rfl:  .  levothyroxine (SYNTHROID) 75 MCG tablet, TAKE 1 TABLET BY MOUTH DAILY AT 6 AM, Disp: 90 tablet, Rfl: 0 .  losartan (COZAAR) 50 MG tablet, Take 1 tablet (50 mg total) by mouth daily., Disp: 90 tablet, Rfl: 1 .  lubiprostone (AMITIZA) 24 MCG capsule, Take 1 capsule (24 mcg  total) by mouth 2 (two) times daily with a meal., Disp: 180 capsule, Rfl: 0 .  malathion (OVIDE) 0.5 % lotion, Apply topically., Disp: , Rfl:  .  Na Sulfate-K Sulfate-Mg Sulf 17.5-3.13-1.6 GM/177ML SOLN, Take 354 mLs by mouth once for 1 dose., Disp: 354 mL, Rfl: 0 .  nicotine (NICOTROL) 10 MG inhaler, Inhale 1 Cartridge (1 continuous puffing total) into the lungs as needed for smoking cessation., Disp: 36 each, Rfl: 3 .  oxybutynin (DITROPAN-XL) 10 MG 24 hr tablet, TAKE 1 TABLET BY MOUTH EACH DAY, Disp: 90 tablet, Rfl: 3 .  pantoprazole (PROTONIX) 40 MG tablet, Take 1 tablet (40 mg total) by mouth daily., Disp: 90 tablet, Rfl: 1 .  prazosin (MINIPRESS) 2 MG capsule, Take 2 mg by mouth at bedtime., Disp: , Rfl:  .  rosuvastatin (CRESTOR) 40 MG tablet, TAKE 1 TABLET BY MOUTH DAILY, Disp: 30 tablet, Rfl: 5 .  sertraline (ZOLOFT) 100 MG tablet, Take 200 mg by mouth daily., Disp: , Rfl:  .  VASCEPA 1 g capsule, TAKE 2 CAPSULES BY MOUTH TWICE A DAY, Disp: 120 capsule, Rfl: 5   Family History  Problem Relation Age of Onset  . Depression Mother   . Anxiety disorder Mother   . Diabetes Mother   . Hypertension Mother   . Hyperlipidemia Mother   . Cancer Mother   . Cancer Father        colon cancer  . Depression Brother   . Anxiety disorder Brother   . Diabetes Mellitus II Maternal Grandmother   . Hypercholesterolemia Maternal Grandmother   . Cancer Paternal Grandmother   . Diabetes Paternal Grandmother      Social History   Tobacco Use  . Smoking status: Current Every Day Smoker    Packs/day: 1.50    Years: 34.00    Pack years: 51.00    Types: Cigarettes    Start date: 05/13/1985  . Smokeless tobacco: Never Used  . Tobacco comment: 1 PPD 02/24/2021  Vaping Use  . Vaping Use: Former  . Start date: 05/25/2018  . Quit date: 09/24/2018  Substance Use Topics  . Alcohol use: Not Currently    Alcohol/week: 0.0 standard drinks    Comment: rarely  . Drug use: Not Currently    Types:  Marijuana    Comment: none since August 2019    Allergies as of 03/21/2021 - Review Complete 03/21/2021  Allergen Reaction Noted  . Metformin and related Nausea And Vomiting 06/24/2019  . Penicillins Other (See Comments) 04/23/2015  . Perphenazine Other (See Comments) 04/23/2015  . Sulfa antibiotics Other (See Comments) 04/23/2015  . Abilify [aripiprazole] Rash 04/23/2015    Review of Systems:    All systems reviewed and negative except where noted in HPI.   Physical Exam:  BP 127/81 (BP Location: Left Arm, Patient Position: Sitting, Cuff Size: Normal)   Pulse 92   Temp 98.7 F (37.1 C) (Oral)   Ht '4\' 11"'  (  1.499 m)   Wt 158 lb 4 oz (71.8 kg)   BMI 31.96 kg/m  No LMP recorded. Patient is postmenopausal.  General:   Alert,  Well-developed, well-nourished, pleasant and cooperative in NAD Head:  Normocephalic and atraumatic. Eyes:  Sclera clear, no icterus.   Conjunctiva pink. Ears:  Normal auditory acuity. Nose:  No deformity, discharge, or lesions. Mouth:  No deformity or lesions,oropharynx pink & moist. Neck:  Supple; no masses or thyromegaly. Lungs:  Respirations even and unlabored.  Clear throughout to auscultation.   No wheezes, crackles, or rhonchi. No acute distress. Heart:  Regular rate and rhythm; no murmurs, clicks, rubs, or gallops. Abdomen:  Normal bowel sounds. Soft, obese, non-tender and moderately distended, tympanic to percussion without masses, hepatosplenomegaly or hernias noted.  No guarding or rebound tenderness.   Rectal: Not performed Msk:  Symmetrical without gross deformities. Good, equal movement & strength bilaterally. Pulses:  Normal pulses noted. Extremities:  No clubbing or edema.  No cyanosis. Neurologic:  Alert and oriented x3;  grossly normal neurologically. Skin:  Intact without significant lesions or rashes. No jaundice. Psych:  Alert and cooperative. Normal mood and affect.  Imaging Studies: Reviewed  Assessment and Plan:   April Luna is a 50 y.o. pleasant Caucasian female with metabolic syndrome, hypothyroidism, chronic tobacco use is seen in consultation for chronic intermittent diarrhea associated with abdominal bloating  Chronic nonbloody diarrhea with abdominal bloating GI profile PCR to rule out infection H. pylori stool antigen Celiac disease panel Check pancreatic fecal elastase levels Colonoscopy with TI evaluation and random colon biopsies if above work-up is negative Recommend to hold Amitiza for now  Colon cancer screening Recommend colonoscopy as above   Follow up in 2 to 3 months   [name], MD

## 2021-03-23 LAB — CELIAC DISEASE PANEL
Endomysial IgA: NEGATIVE
IgA/Immunoglobulin A, Serum: 195 mg/dL (ref 87–352)
Transglutaminase IgA: <2 U/mL (ref 0–3)

## 2021-03-24 ENCOUNTER — Telehealth: Payer: Self-pay

## 2021-03-24 ENCOUNTER — Ambulatory Visit: Payer: Medicare Other | Admitting: Dermatology

## 2021-03-24 NOTE — Telephone Encounter (Signed)
Lm for patient.  

## 2021-03-24 NOTE — Telephone Encounter (Signed)
-----   Message from Tyler Pita, MD sent at 03/24/2021  9:12 AM EDT ----- Regarding: Sleep study She has moderate sleep apnea AHI of 21 with desaturations as low as 81%.  She will need Auto CPAP 5 to 20 cm H2O with download after 2 weeks of use.  CLG   ----- Message ----- From: Tana Coast Sent: 03/16/2021   4:37 PM EDT To: Tyler Pita, MD

## 2021-03-25 NOTE — Telephone Encounter (Signed)
Spoke to patient and relayed below results.  Patient is unsure if she would like to proceed with CPAP. She would like to call back after making a decision.   Routing to Dr. Patsey Berthold as an Juluis Rainier

## 2021-03-25 NOTE — Telephone Encounter (Signed)
Noted however it is important for her to know that a lot of her symptoms may be related to uncompensated sleep apnea.

## 2021-03-25 NOTE — Telephone Encounter (Signed)
Lm for patient.  

## 2021-03-28 NOTE — Telephone Encounter (Signed)
Patient is aware of below message and voiced her understanding. She stated that she would call back if she decides to proceed with cpap.  Nothing further is needed at this time.

## 2021-04-07 ENCOUNTER — Telehealth: Payer: Self-pay | Admitting: Gastroenterology

## 2021-04-07 ENCOUNTER — Other Ambulatory Visit: Payer: Self-pay | Admitting: Family Medicine

## 2021-04-07 DIAGNOSIS — E1169 Type 2 diabetes mellitus with other specified complication: Secondary | ICD-10-CM

## 2021-04-07 DIAGNOSIS — K5909 Other constipation: Secondary | ICD-10-CM

## 2021-04-07 NOTE — Telephone Encounter (Signed)
Patient states she is waiting on Taxes to come back so she can afford the colonoscopy. She will call us back when she is ready to rescheduled. Called Trish and informed her to cancel the patient

## 2021-04-07 NOTE — Telephone Encounter (Signed)
Patient needs to reschedule her procedure due to money.  Please call

## 2021-04-11 ENCOUNTER — Ambulatory Visit: Admission: RE | Admit: 2021-04-11 | Payer: Medicare Other | Source: Home / Self Care | Admitting: Gastroenterology

## 2021-04-11 ENCOUNTER — Encounter: Admission: RE | Payer: Self-pay | Source: Home / Self Care

## 2021-04-11 SURGERY — COLONOSCOPY WITH PROPOFOL
Anesthesia: General

## 2021-04-27 ENCOUNTER — Other Ambulatory Visit: Payer: Self-pay | Admitting: Family Medicine

## 2021-04-27 DIAGNOSIS — E1169 Type 2 diabetes mellitus with other specified complication: Secondary | ICD-10-CM

## 2021-04-27 NOTE — Telephone Encounter (Signed)
Requested medication (s) are due for refill today: yes  Requested medication (s) are on the active medication list: yes  Last refill:03/22/2021  Future visit scheduled: yes  Notes to clinic:   Medication not assigned to a protocol, review manually  Requested Prescriptions  Pending Prescriptions Disp Refills   icosapent Ethyl (VASCEPA) 1 g capsule [Pharmacy Med Name: ICOSAPENT ETHYL 1 GM CAP] 120 capsule 5    Sig: TAKE 2 CAPSULES BY MOUTH TWICE A DAY      Off-Protocol Failed - 04/27/2021 10:12 AM      Failed - Medication not assigned to a protocol, review manually.      Passed - Valid encounter within last 12 months    Recent Outpatient Visits           2 months ago Well adult exam   [facility] Steele Sizer, MD   3 months ago Type 2 diabetes mellitus with microalbuminuria, without long-term current use of insulin [facility])   [facility] Steele Sizer, MD   5 months ago Need for hepatitis C screening test   [facility] Steele Sizer, MD   6 months ago Viral syndrome   [facility] Towanda Malkin, MD   7 months ago Erroneous encounter - disregard   [place], [place], [place]       Future Appointments             In 6 days Steele Sizer, MD [facility], [place]   In 1 month Vanga, Tally Due, MD [place]   In 2 months  [facility], [facility]

## 2021-04-27 NOTE — Telephone Encounter (Signed)
Last seen: 2.21.2022 Next sch'd: 5.10.2022

## 2021-05-03 ENCOUNTER — Ambulatory Visit: Payer: Medicare Other | Admitting: Family Medicine

## 2021-05-09 NOTE — Progress Notes (Deleted)
Name: April Luna   MRN: [id number]    DOB: 1971/11/22   Date:05/09/2021       Progress Note  Subjective  Chief Complaint  Follow up   HPI COPD: she is supposed to take  Trelegy given by Dr. Patsey Berthold but she has been out of her medications. She has chronic cough and sputum that is yellow, she also has noticed increase in wheezing since out of trelegy, occasionally has sob with activity. . She is still smoking, back to one pack daily, used to smoke close to 2 packs in the past . Advised her to resume medication again   HTN:bp is at goal, since medication was adjusted no longer having dizziness or palpitation, she has intermittent pulsating sensation of left side of the chest that can last at most one minute, asked if it happens when she is anxious but she is not sure, advised to monitor the intensity, type of pain and if lasts more than 10 minutes   Hyperlipidemia: we switched from Pravachol to Crestor, she states she forgets to take medication, she states Armen Pickup is trying to help her be more compliant with medication   Chronic constipation:she states she was taking Amitiza once a day but now taking twice daily and bowel movements have improved, to once or twice daily and no straining, no blood in stools.   DMII: with dyslipidemia, on diet only, A1C today is at goal at 6.2 %  she states mouth is always dry, but no polyphagia or polyuria. She has  microalbuminuria and CKI stage III , she has dyslipidemia and is supposed to take medications, but stops all of them when manic.   Bipolar disorder:she is seeing psychiatrist Dr.A at [facility]  she went manic a couple of weeks ago and stopped medication, she is feeling down now Last admission was 2016 for a psychotic episode. No history of suicide attempts.  Hypothyroidism: she is taking levothyroxine and denies hair loss, she has chronic dry skin . Last TSH was at goal. Continue current regiment   Chronic right lower back  pain: going on for years, she states got better when adjusted, seems to be right on top of sacro- iliac joint, she states occasionally shoots down to her right lower leg. Taking gabapentin . Advised Tylenol. Pain is daily, aching worse on the right side, radiates down right leg intermittently  Lice: she states got it from her son - he is 51 yo, she states treated 6 months ago with otc medication and is much worse now.   Psoriasis: seen by dermatologist in the past, Dr. Koleen Nimrod, and was treated with topical medication but not seen a dermatologist in a while and would like to go back    *** Patient Active Problem List   Diagnosis Date Noted  . Plantar callus 10/06/2019  . Acquired hallux limitus of both feet 10/06/2019  . Morbid obesity ([place]) 06/24/2019  . Type 2 diabetes mellitus with microalbuminuria, without long-term current use of insulin ([place]) 06/24/2019  . Chronic obstructive pulmonary disease ([place]) 06/24/2019  . Chronic constipation 06/24/2019  . ASCUS of cervix with negative high risk HPV 09/05/2018  . GERD without esophagitis 04/11/2018  . Hyperlipidemia 04/11/2018  . Hypertension 04/11/2018  . Hypothyroidism 04/26/2015  . Bipolar I disorder, most recent episode (or current) manic ([place]) 04/25/2015  . Delirium due to another medical condition 04/25/2015  . Cannabis abuse 04/25/2015    Past Surgical History:  Procedure Laterality Date  . CESAREAN SECTION    .  CYSTOSCOPY W/ RETROGRADES Left 11/08/2018   Procedure: CYSTOSCOPY WITH RETROGRADE PYELOGRAM;  Surgeon: Billey Co, MD;  Location: [facility];  Service: Urology;  Laterality: Left;  . CYSTOSCOPY/URETEROSCOPY/HOLMIUM LASER/STENT PLACEMENT Left 11/08/2018   Procedure: CYSTOSCOPY/URETEROSCOPY/HOLMIUM LASER/STENT PLACEMENT;  Surgeon: Billey Co, MD;  Location: [facility];  Service: Urology;  Laterality: Left;  . MOUTH SURGERY     wisdom teeth extraction    Family History  Problem Relation Age of Onset  .  Depression Mother   . Anxiety disorder Mother   . Diabetes Mother   . Hypertension Mother   . Hyperlipidemia Mother   . Cancer Mother   . Cancer Father        colon cancer  . Depression Brother   . Anxiety disorder Brother   . Diabetes Mellitus II Maternal Grandmother   . Hypercholesterolemia Maternal Grandmother   . Cancer Paternal Grandmother   . Diabetes Paternal Grandmother     Social History   Tobacco Use  . Smoking status: Current Every Day Smoker    Packs/day: 1.50    Years: 34.00    Pack years: 51.00    Types: Cigarettes    Start date: 05/13/1985  . Smokeless tobacco: Never Used  . Tobacco comment: 1 PPD 02/24/2021  Substance Use Topics  . Alcohol use: Not Currently    Alcohol/week: 0.0 standard drinks    Comment: rarely     Current Outpatient Medications:  .  icosapent Ethyl (VASCEPA) 1 g capsule, TAKE 2 CAPSULES BY MOUTH TWICE A DAY, Disp: 120 capsule, Rfl: 5 .  albuterol (PROVENTIL) (2.5 MG/3ML) 0.083% nebulizer solution, Take 3 mLs (2.5 mg total) by nebulization every 6 (six) hours as needed for wheezing or shortness of breath., Disp: 150 mL, Rfl: 1 .  amantadine (SYMMETREL) 100 MG capsule, Take 100 mg by mouth 2 (two) times daily., Disp: , Rfl:  .  blood glucose meter kit and supplies, Dispense based on patient and insurance preference. Use up to four times daily as directed. (FOR ICD-10 E10.9, E11.9)., Disp: 1 each, Rfl: 0 .  Budeson-Glycopyrrol-Formoterol (BREZTRI AEROSPHERE) 160-9-4.8 MCG/ACT AERO, Inhale 2 puffs into the lungs in the morning and at bedtime., Disp: 5.9 g, Rfl: 11 .  buPROPion (WELLBUTRIN XL) 150 MG 24 hr tablet, Take 150 mg by mouth daily., Disp: , Rfl:  .  Cholecalciferol (VITAMIN D-3) 125 MCG (5000 UT) TABS, Take 5,000 Units by mouth daily., Disp: 30 tablet, Rfl: 1 .  gabapentin (NEURONTIN) 300 MG capsule, Take 300 mg by mouth 2 (two) times daily., Disp: , Rfl:  .  hydrOXYzine (VISTARIL) 50 MG capsule, Take 50 mg by mouth 2 (two) times  daily as needed for anxiety., Disp: , Rfl:  .  INVEGA SUSTENNA 234 MG/1.5ML SUSY injection, Inject into the muscle. Inject as directed once a month, Disp: , Rfl:  .  levothyroxine (SYNTHROID) 75 MCG tablet, TAKE 1 TABLET BY MOUTH DAILY AT 6 AM, Disp: 90 tablet, Rfl: 0 .  losartan (COZAAR) 50 MG tablet, Take 1 tablet (50 mg total) by mouth daily., Disp: 90 tablet, Rfl: 1 .  lubiprostone (AMITIZA) 24 MCG capsule, TAKE 1 CAPSULE BY MOUTH TWICE A DAY WITHA MEAL, Disp: 180 capsule, Rfl: 0 .  malathion (OVIDE) 0.5 % lotion, Apply topically., Disp: , Rfl:  .  nicotine (NICOTROL) 10 MG inhaler, Inhale 1 Cartridge (1 continuous puffing total) into the lungs as needed for smoking cessation., Disp: 36 each, Rfl: 3 .  oxybutynin (DITROPAN-XL) 10 MG 24 hr  tablet, TAKE 1 TABLET BY MOUTH EACH DAY, Disp: 90 tablet, Rfl: 3 .  pantoprazole (PROTONIX) 40 MG tablet, Take 1 tablet (40 mg total) by mouth daily., Disp: 90 tablet, Rfl: 1 .  prazosin (MINIPRESS) 2 MG capsule, Take 2 mg by mouth at bedtime., Disp: , Rfl:  .  rosuvastatin (CRESTOR) 40 MG tablet, TAKE 1 TABLET BY MOUTH DAILY, Disp: 30 tablet, Rfl: 5 .  sertraline (ZOLOFT) 100 MG tablet, Take 200 mg by mouth daily., Disp: , Rfl:  .  SUPREP BOWEL PREP KIT 17.5-3.13-1.6 GM/177ML SOLN, Take by mouth., Disp: , Rfl:   Allergies  Allergen Reactions  . Metformin And Related Nausea And Vomiting  . Penicillins Other (See Comments)    She has taken amoxicillin without problems  . Perphenazine Other (See Comments)    Tremors, muscle weakness, tongue swelling  . Sulfa Antibiotics Other (See Comments)    GI distress   . Abilify [Aripiprazole] Rash    I personally reviewed {Reviewed:14835} with the patient/caregiver today.   ROS  ***  Objective  There were no vitals filed for this visit.  There is no height or weight on file to calculate BMI.  Physical Exam ***  Recent Results (from the past 2160 hour(s))  Cytology - PAP     Status: None    Collection Time: 02/14/21  2:33 PM  Result Value Ref Range   High risk HPV Negative    Adequacy      Satisfactory for evaluation; transformation zone component PRESENT.   Diagnosis      - Negative for intraepithelial lesion or malignancy (NILM)   Comment Normal Reference Range HPV - Negative    Microorganisms      Fungal organisms present consistent with Candida spp.  Celiac Disease Panel     Status: None   Collection Time: 03/21/21  2:00 PM  Result Value Ref Range   Endomysial IgA Negative Negative   Transglutaminase IgA <2 0 - 3 U/mL    Comment:                               Negative        0 -  3                               Weak Positive   4 - 10                               Positive           >10  Tissue Transglutaminase (tTG) has been identified  as the endomysial antigen.  Studies have demonstr-  ated that endomysial IgA antibodies have over 99%  specificity for gluten sensitive enteropathy.    IgA/Immunoglobulin A, Serum 195 87 - 352 mg/dL    Diabetic Foot Exam: Diabetic Foot Exam - Simple   No data filed    ***  PHQ2/9: Depression screen [facility] 2/9 02/14/2021 01/19/2021 11/03/2020 10/13/2020 06/29/2020  Decreased Interest 0 0 1 3 0  Down, Depressed, Hopeless '1 1 1 1 ' 0  PHQ - 2 Score '1 1 2 4 ' 0  Altered sleeping 1 0 2 1 -  Tired, decreased energy '1 3 2 3 ' -  Change in appetite 1 0 3 2 -  Feeling bad or failure about yourself  0 0  2 0 -  Trouble concentrating '2 2 3 2 ' -  Moving slowly or fidgety/restless 0 0 0 2 -  Suicidal thoughts 0 0 0 0 -  PHQ-9 Score '6 6 14 14 ' -  Difficult doing work/chores - - Somewhat difficult Very difficult -  Some recent data might be hidden    phq 9 is {gen pos COD:[id number]} ***  Fall Risk: Fall Risk  02/14/2021 01/19/2021 11/03/2020 11/03/2020 10/13/2020  Falls in the past year? 0 0 0 0 0  Number falls in past yr: 0 0 0 0 0  Injury with Fall? 0 0 0 0 0  Risk for fall due to : - - - - -  Follow up - - - - -   ***   Functional  Status Survey:   ***   Assessment & Plan  *** There are no diagnoses linked to this encounter.

## 2021-05-10 ENCOUNTER — Ambulatory Visit: Payer: Medicare Other | Admitting: Family Medicine

## 2021-05-10 DIAGNOSIS — Z1211 Encounter for screening for malignant neoplasm of colon: Secondary | ICD-10-CM

## 2021-05-10 DIAGNOSIS — E1129 Type 2 diabetes mellitus with other diabetic kidney complication: Secondary | ICD-10-CM

## 2021-05-30 ENCOUNTER — Encounter: Payer: Self-pay | Admitting: Gastroenterology

## 2021-05-30 ENCOUNTER — Other Ambulatory Visit: Payer: Self-pay

## 2021-05-30 ENCOUNTER — Ambulatory Visit: Payer: Medicare Other | Admitting: Gastroenterology

## 2021-05-30 VITALS — BP 137/96 | HR 108 | Temp 98.5°F | Ht 59.0 in | Wt 155.0 lb

## 2021-05-30 DIAGNOSIS — K5904 Chronic idiopathic constipation: Secondary | ICD-10-CM | POA: Diagnosis not present

## 2021-05-30 NOTE — Progress Notes (Signed)
April Darby, MD [address]  [place]  [place], [place] [address]  Main: [phone]  Fax: [phone]    Gastroenterology Consultation  Referring Provider:     Steele Sizer, MD Primary Care Physician:  April Sizer, MD Primary Gastroenterologist:  April Luna Reason for Consultation:     Chronic diarrhea, abdominal bloating        HPI:   April Luna is a 50 y.o. female referred by Dr. Steele Sizer, MD  for consultation & management of chronic diarrhea and abdominal bloating.  Patient reports that she has been experiencing several weeks of nonbloody diarrhea, 3-4 times daily and sometimes nocturnal diarrhea as well.  She does report severe abdominal bloating.  She does acknowledge drinking sweet tea daily.  She denies any weight loss.  Apparently, she has history of chronic constipation for which she has been on Amitiza 24 MCG, however, she has been taking only once a day because it leads to diarrhea.  History of metabolic syndrome, hypothyroidism, prediabetes.  Her TSH is normal.  No evidence of anemia.  She is also here to discuss about colonoscopy She does smoke 1 pack/day, since age of 64  Follow-up visit 05/30/2021 Patient reports that her diarrhea has resolved after stopping Amitiza.  She could not undergo colonoscopy due to financial constraints.  She reports ongoing constipation.  She thinks she does not eat enough fiber or has adequate intake of water  NSAIDs: None  Antiplts/Anticoagulants/Anti thrombotics: None  GI Procedures: None Father with colon cancer in his 54s  Past Medical History:  Diagnosis Date  . Allergy    pollen  . Anxiety   . Bipolar 1 disorder ([place])   . COPD (chronic obstructive pulmonary disease) ([place])   . Depression   . Family history of adverse reaction to anesthesia    grand father had a stroke during anesthesia  . GERD (gastroesophageal reflux disease)   . History of kidney stones   . Hyperlipidemia   .  Hypertension   . Hypothyroidism   . Pneumonia   . Type 2 diabetes mellitus with microalbuminuria, without long-term current use of insulin ([place]) 06/24/2019    Past Surgical History:  Procedure Laterality Date  . CESAREAN SECTION    . CYSTOSCOPY W/ RETROGRADES Left 11/08/2018   Procedure: CYSTOSCOPY WITH RETROGRADE PYELOGRAM;  Surgeon: Billey Co, MD;  Location: [facility];  Service: Urology;  Laterality: Left;  . CYSTOSCOPY/URETEROSCOPY/HOLMIUM LASER/STENT PLACEMENT Left 11/08/2018   Procedure: CYSTOSCOPY/URETEROSCOPY/HOLMIUM LASER/STENT PLACEMENT;  Surgeon: Billey Co, MD;  Location: [facility];  Service: Urology;  Laterality: Left;  . MOUTH SURGERY     wisdom teeth extraction    Current Outpatient Medications:  .  albuterol (PROVENTIL) (2.5 MG/3ML) 0.083% nebulizer solution, Take 3 mLs (2.5 mg total) by nebulization every 6 (six) hours as needed for wheezing or shortness of breath., Disp: 150 mL, Rfl: 1 .  amantadine (SYMMETREL) 100 MG capsule, Take 100 mg by mouth 2 (two) times daily., Disp: , Rfl:  .  blood glucose meter kit and supplies, Dispense based on patient and insurance preference. Use up to four times daily as directed. (FOR ICD-10 E10.9, E11.9)., Disp: 1 each, Rfl: 0 .  Budeson-Glycopyrrol-Formoterol (BREZTRI AEROSPHERE) 160-9-4.8 MCG/ACT AERO, Inhale 2 puffs into the lungs in the morning and at bedtime., Disp: 5.9 g, Rfl: 11 .  buPROPion (WELLBUTRIN XL) 150 MG 24 hr tablet, Take 150 mg by mouth daily., Disp: , Rfl:  .  Cholecalciferol (VITAMIN  D-3) 125 MCG (5000 UT) TABS, Take 5,000 Units by mouth daily., Disp: 30 tablet, Rfl: 1 .  gabapentin (NEURONTIN) 300 MG capsule, Take 300 mg by mouth 2 (two) times daily., Disp: , Rfl:  .  hydrOXYzine (VISTARIL) 50 MG capsule, Take 50 mg by mouth 2 (two) times daily as needed for anxiety., Disp: , Rfl:  .  icosapent Ethyl (VASCEPA) 1 g capsule, TAKE 2 CAPSULES BY MOUTH TWICE A DAY, Disp: 120 capsule, Rfl: 5 .  INVEGA SUSTENNA  234 MG/1.5ML SUSY injection, Inject into the muscle. Inject as directed once a month, Disp: , Rfl:  .  levothyroxine (SYNTHROID) 75 MCG tablet, TAKE 1 TABLET BY MOUTH DAILY AT 6 AM, Disp: 90 tablet, Rfl: 0 .  losartan (COZAAR) 50 MG tablet, Take 1 tablet (50 mg total) by mouth daily., Disp: 90 tablet, Rfl: 1 .  lubiprostone (AMITIZA) 24 MCG capsule, TAKE 1 CAPSULE BY MOUTH TWICE A DAY WITHA MEAL, Disp: 180 capsule, Rfl: 0 .  malathion (OVIDE) 0.5 % lotion, Apply topically., Disp: , Rfl:  .  nicotine (NICOTROL) 10 MG inhaler, Inhale 1 Cartridge (1 continuous puffing total) into the lungs as needed for smoking cessation., Disp: 36 each, Rfl: 3 .  oxybutynin (DITROPAN-XL) 10 MG 24 hr tablet, TAKE 1 TABLET BY MOUTH EACH DAY, Disp: 90 tablet, Rfl: 3 .  pantoprazole (PROTONIX) 40 MG tablet, Take 1 tablet (40 mg total) by mouth daily., Disp: 90 tablet, Rfl: 1 .  prazosin (MINIPRESS) 2 MG capsule, Take 2 mg by mouth at bedtime., Disp: , Rfl:  .  rosuvastatin (CRESTOR) 40 MG tablet, TAKE 1 TABLET BY MOUTH DAILY, Disp: 30 tablet, Rfl: 5 .  sertraline (ZOLOFT) 100 MG tablet, Take 200 mg by mouth daily., Disp: , Rfl:    Family History  Problem Relation Age of Onset  . Depression Mother   . Anxiety disorder Mother   . Diabetes Mother   . Hypertension Mother   . Hyperlipidemia Mother   . Cancer Mother   . Cancer Father        colon cancer  . Depression Brother   . Anxiety disorder Brother   . Diabetes Mellitus II Maternal Grandmother   . Hypercholesterolemia Maternal Grandmother   . Cancer Paternal Grandmother   . Diabetes Paternal Grandmother      Social History   Tobacco Use  . Smoking status: Current Every Day Smoker    Packs/day: 1.50    Years: 34.00    Pack years: 51.00    Types: Cigarettes    Start date: 05/13/1985  . Smokeless tobacco: Never Used  . Tobacco comment: 1 PPD 02/24/2021  Vaping Use  . Vaping Use: Former  . Start date: 05/25/2018  . Quit date: 09/24/2018  Substance Use  Topics  . Alcohol use: Not Currently    Alcohol/week: 0.0 standard drinks    Comment: rarely  . Drug use: Not Currently    Types: Marijuana    Comment: none since August 2019    Allergies as of 05/30/2021 - Review Complete 05/30/2021  Allergen Reaction Noted  . Metformin and related Nausea And Vomiting 06/24/2019  . Penicillins Other (See Comments) 04/23/2015  . Perphenazine Other (See Comments) 04/23/2015  . Sulfa antibiotics Other (See Comments) 04/23/2015  . Abilify [aripiprazole] Rash 04/23/2015    Review of Systems:    All systems reviewed and negative except where noted in HPI.   Physical Exam:  BP (!) 137/96 (BP Location: Left Arm, Patient Position: Sitting, Cuff  Size: Normal)   Pulse (!) 108   Temp 98.5 F (36.9 C) (Oral)   Ht '4\' 11"'  (1.499 m)   Wt 155 lb (70.3 kg)   BMI 31.31 kg/m  No LMP recorded. Patient is postmenopausal.  General:   Alert,  Well-developed, well-nourished, pleasant and cooperative in NAD Head:  Normocephalic and atraumatic. Eyes:  Sclera clear, no icterus.   Conjunctiva pink. Ears:  Normal auditory acuity. Nose:  No deformity, discharge, or lesions. Mouth:  No deformity or lesions,oropharynx pink & moist. Neck:  Supple; no masses or thyromegaly. Lungs:  Respirations even and unlabored.  Clear throughout to auscultation.   No wheezes, crackles, or rhonchi. No acute distress. Heart:  Regular rate and rhythm; no murmurs, clicks, rubs, or gallops. Abdomen:  Normal bowel sounds. Soft, obese, non-tender and moderately distended, tympanic to percussion without masses, hepatosplenomegaly or hernias noted.  No guarding or rebound tenderness.   Rectal: Not performed Msk:  Symmetrical without gross deformities. Good, equal movement & strength bilaterally. Pulses:  Normal pulses noted. Extremities:  No clubbing or edema.  No cyanosis. Neurologic:  Alert and oriented x3;  grossly normal neurologically. Skin:  Intact without significant lesions or rashes.  No jaundice. Psych:  Alert and cooperative. Normal mood and affect.  Imaging Studies: Reviewed  Assessment and Plan:   April Luna is a 50 y.o. pleasant Caucasian female with metabolic syndrome, hypothyroidism, chronic tobacco use is seen in consultation for chronic intermittent diarrhea associated with abdominal bloating.  Symptoms have resolved after discontinuation of Amitiza.  Patient is currently experiencing constipation  Chronic constipation Discussed about high-fiber diet, adequate intake of water, information provided Samples of MiraLAX provided If no improvement, will try low-dose Amitiza  Colon cancer screening Recommend colonoscopy and patient will call to schedule   Follow up as needed   April Darby, MD

## 2021-05-30 NOTE — Patient Instructions (Signed)
High-Fiber Eating Plan Fiber, also called dietary fiber, is a type of carbohydrate. It is found foods such as fruits, vegetables, whole grains, and beans. A high-fiber diet can have many health benefits. Your health care provider may recommend a high-fiber diet to help:  Prevent constipation. Fiber can make your bowel movements more regular.  Lower your cholesterol.  Relieve the following conditions: ? Inflammation of veins in the anus (hemorrhoids). ? Inflammation of specific areas of the digestive tract (uncomplicated diverticulosis). ? A problem of the large intestine, also called the colon, that sometimes causes pain and diarrhea (irritable bowel syndrome, or IBS).  Prevent overeating as part of a weight-loss plan.  Prevent heart disease, type 2 diabetes, and certain cancers. What are tips for following this plan? Reading food labels  Check the nutrition facts label on food products for the amount of dietary fiber. Choose foods that have 5 grams of fiber or more per serving.  The goals for recommended daily fiber intake include: ? Men (age 50 or younger): 34-38 g. ? Men (over age 50): 28-34 g. ? Women (age 50 or younger): 25-28 g. ? Women (over age 50): 22-25 g. Your daily fiber goal is _____________ g.   Shopping  Choose whole fruits and vegetables instead of processed forms, such as apple juice or applesauce.  Choose a wide variety of high-fiber foods such as avocados, lentils, oats, and kidney beans.  Read the nutrition facts label of the foods you choose. Be aware of foods with added fiber. These foods often have high sugar and sodium amounts per serving. Cooking  Use whole-grain flour for baking and cooking.  Cook with brown rice instead of white rice. Meal planning  Start the day with a breakfast that is high in fiber, such as a cereal that contains 5 g of fiber or more per serving.  Eat breads and cereals that are made with whole-grain flour instead of refined  flour or white flour.  Eat brown rice, bulgur wheat, or millet instead of white rice.  Use beans in place of meat in soups, salads, and pasta dishes.  Be sure that half of the grains you eat each day are whole grains. General information  You can get the recommended daily intake of dietary fiber by: ? Eating a variety of fruits, vegetables, grains, nuts, and beans. ? Taking a fiber supplement if you are not able to take in enough fiber in your diet. It is better to get fiber through food than from a supplement.  Gradually increase how much fiber you consume. If you increase your intake of dietary fiber too quickly, you may have bloating, cramping, or gas.  Drink plenty of water to help you digest fiber.  Choose high-fiber snacks, such as berries, raw vegetables, nuts, and popcorn. What foods should I eat? Fruits Berries. Pears. Apples. Oranges. Avocado. Prunes and raisins. Dried figs. Vegetables Sweet potatoes. Spinach. Kale. Artichokes. Cabbage. Broccoli. Cauliflower. Green peas. Carrots. Squash. Grains Whole-grain breads. Multigrain cereal. Oats and oatmeal. Brown rice. Barley. Bulgur wheat. Millet. Quinoa. Bran muffins. Popcorn. Rye wafer crackers. Meats and other proteins Navy beans, kidney beans, and pinto beans. Soybeans. Split peas. Lentils. Nuts and seeds. Dairy Fiber-fortified yogurt. Beverages Fiber-fortified soy milk. Fiber-fortified orange juice. Other foods Fiber bars. The items listed above may not be a complete list of recommended foods and beverages. Contact a dietitian for more information. What foods should I avoid? Fruits Fruit juice. Cooked, strained fruit. Vegetables Fried potatoes. Canned vegetables. Well-cooked vegetables. Grains   White bread. Pasta made with refined flour. White rice. Meats and other proteins Fatty cuts of meat. Fried chicken or fried fish. Dairy Milk. Yogurt. Cream cheese. Sour cream. Fats and oils Butters. Beverages Soft  drinks. Other foods Cakes and pastries. The items listed above may not be a complete list of foods and beverages to avoid. Talk with your dietitian about what choices are best for you. Summary  Fiber is a type of carbohydrate. It is found in foods such as fruits, vegetables, whole grains, and beans.  A high-fiber diet has many benefits. It can help to prevent constipation, lower blood cholesterol, aid weight loss, and reduce your risk of heart disease, diabetes, and certain cancers.  Increase your intake of fiber gradually. Increasing fiber too quickly may cause cramping, bloating, and gas. Drink plenty of water while you increase the amount of fiber you consume.  The best sources of fiber include whole fruits and vegetables, whole grains, nuts, seeds, and beans. This information is not intended to replace advice given to you by your health care provider. Make sure you discuss any questions you have with your health care provider. Document Revised: 04/15/2020 Document Reviewed: 04/15/2020 Elsevier Patient Education  2021 [employer]  

## 2021-05-31 ENCOUNTER — Other Ambulatory Visit: Payer: Self-pay | Admitting: Family Medicine

## 2021-05-31 DIAGNOSIS — E032 Hypothyroidism due to medicaments and other exogenous substances: Secondary | ICD-10-CM

## 2021-05-31 NOTE — Telephone Encounter (Signed)
Last appt 02-14-2021 for a cpe. No furture appt

## 2021-05-31 NOTE — Telephone Encounter (Signed)
Requested medication (s) are due for refill today: no  Requested medication (s) are on the active medication list: yes   Last refill:  05/03/2021  Future visit scheduled: no  Notes to clinic:  Failed protocol: TSH needs to be rechecked within 3 months after an abnormal result. Refill until TSH is due   Requested Prescriptions  Pending Prescriptions Disp Refills   levothyroxine (SYNTHROID) 75 MCG tablet [Pharmacy Med Name: LEVOTHYROXINE SODIUM 75 MCG TAB] 90 tablet 0    Sig: TAKE 1 TABLET BY MOUTH DAILY AT 6 AM      Endocrinology:  Hypothyroid Agents Failed - 05/31/2021 12:55 PM      Failed - TSH needs to be rechecked within 3 months after an abnormal result. Refill until TSH is due.      Failed - TSH in normal range and within 360 days    TSH  Date Value Ref Range Status  04/21/2020 2.17 mIU/L Final    Comment:              Reference Range .           > or = 20 Years  0.40-4.50 .                Pregnancy Ranges           First trimester    0.26-2.66           Second trimester   0.55-2.73           Third trimester    0.43-2.91           Passed - Valid encounter within last 12 months    Recent Outpatient Visits           3 months ago Well adult exam   [facility] [place], Drue Stager, MD   4 months ago Type 2 diabetes mellitus with microalbuminuria, without long-term current use of insulin [facility])   [facility] Steele Sizer, MD   6 months ago Need for hepatitis C screening test   [facility] Steele Sizer, MD   7 months ago Viral syndrome   [facility] Tow[place] Malkin, MD   8 months ago Erroneous encounter - disregard   [place]       Future Appointments             In 1 month [facility], [facility]

## 2021-06-17 NOTE — Progress Notes (Signed)
Name: April Luna   MRN: [id number]    DOB: July 14, 1971   Date:06/21/2021       Progress Note  Subjective  Chief Complaint  Medication Refill  HPI  COPD: she sees Dr. Duwayne Heck and is now on [place]. She has chronic cough and sputum that is yellow, wheezing has improved - usually only notices it at night. She also had a sleep study that was positive for OSA but cannot afford CPAP at thist ime. . She is still smoking, back to one pack daily, used to smoke close to 2 packs in the past .She will try to quit    HTN: bp is at goal, denies orthostatic changes , she has intermittent pain under her breast, usually when she is stressed, usually during panic attacks. Taking losartan and denies side effects    Hyperlipidemia: we switched from Pravachol to Crestor , she states she forgets to take medication, she states Armen Pickup is trying to help her be more compliant with medication , advised to take it in am's with other medications   Chronic constipation: she has intermittent abdominal pain at different areas of abdomen such as RUQ, LUQ, lower abdomen, she has stopped Amitiza due to diarrhea, has small bowels and sometimes , only taking fiber supplements and is under the care of GI, waiting to drop off stool for studies.    DMII: with dyslipidemia, on diet only, A1C today is at goal at 6.21%  she states mouth is always dry, but no polyphagia or polyuria. She has  microalbuminuria and CKI stage III , she has dyslipidemia , she is not currently taking medication, she states difficulty when she is manic, advised to take all meds in the morning    Bipolar disorder: she is seeing psychiatrist Dr.A at [facility]  she states she has been feeling more down lately, but denies suicidal thoughts. Last admission was 2016 for a psychotic episode. No history of suicide attempts.   Hypothyroidism: she is taking levothyroxine and denies hair loss, no change in bowel movement, she has chronic dry skin Last TSH  was at goal. Continue current regiment  we will recheck TSH today    Chronic right lower back pain: going on for years, she states got better when adjusted, seems to be right on top of sacro- iliac joint, she states occasionally shoots down to her right lower leg. Taking gabapentin . Advised Tylenol. She states she is doing better lately  Psoriasis: seen by dermatologist in the past, Dr. Koleen Nimrod, she was given a refill but unable to go due to transportation   Dysuria: started about one month ago, she states off an on, sometimes has incontinence, urinary urgency, and sometimes dysuria, no fever or chills.   Patient Active Problem List   Diagnosis Date Noted   Plantar callus 10/06/2019   Acquired hallux limitus of both feet 10/06/2019   Morbid obesity ([place]) 06/24/2019   Type 2 diabetes mellitus with microalbuminuria, without long-term current use of insulin ([place]) 06/24/2019   Chronic obstructive pulmonary disease ([place]) 06/24/2019   Chronic constipation 06/24/2019   ASCUS of cervix with negative high risk HPV 09/05/2018   GERD without esophagitis 04/11/2018   Hyperlipidemia 04/11/2018   Hypertension 04/11/2018   Hypothyroidism 04/26/2015   Bipolar I disorder, most recent episode (or current) manic ([place]) 04/25/2015   Delirium due to another medical condition 04/25/2015   Cannabis abuse 04/25/2015    Past Surgical History:  Procedure Laterality Date   CESAREAN SECTION  CYSTOSCOPY W/ RETROGRADES Left 11/08/2018   Procedure: CYSTOSCOPY WITH RETROGRADE PYELOGRAM;  Surgeon: Billey Co, MD;  Location: [facility] ORS;  Service: Urology;  Laterality: Left;   CYSTOSCOPY/URETEROSCOPY/HOLMIUM LASER/STENT PLACEMENT Left 11/08/2018   Procedure: CYSTOSCOPY/URETEROSCOPY/HOLMIUM LASER/STENT PLACEMENT;  Surgeon: Billey Co, MD;  Location: [facility] ORS;  Service: Urology;  Laterality: Left;   MOUTH SURGERY     wisdom teeth extraction    Family History  Problem Relation Age of Onset    Depression Mother    Anxiety disorder Mother    Diabetes Mother    Hypertension Mother    Hyperlipidemia Mother    Cancer Mother    Cancer Father        colon cancer   Depression Brother    Anxiety disorder Brother    Diabetes Mellitus II Maternal Grandmother    Hypercholesterolemia Maternal Grandmother    Cancer Paternal Grandmother    Diabetes Paternal Grandmother     Social History   Tobacco Use   Smoking status: Every Day    Packs/day: 1.50    Years: 34.00    Pack years: 51.00    Types: Cigarettes    Start date: 05/13/1985   Smokeless tobacco: Never   Tobacco comments:    1 PPD 02/24/2021  Substance Use Topics   Alcohol use: Not Currently    Alcohol/week: 0.0 standard drinks    Comment: rarely     Current Outpatient Medications:    albuterol (PROVENTIL) (2.5 MG/3ML) 0.083% nebulizer solution, Take 3 mLs (2.5 mg total) by nebulization every 6 (six) hours as needed for wheezing or shortness of breath., Disp: 150 mL, Rfl: 1   amantadine (SYMMETREL) 100 MG capsule, Take 100 mg by mouth 2 (two) times daily., Disp: , Rfl:    blood glucose meter kit and supplies, Dispense based on patient and insurance preference. Use up to four times daily as directed. (FOR ICD-10 E10.9, E11.9)., Disp: 1 each, Rfl: 0   Budeson-Glycopyrrol-Formoterol (BREZTRI AEROSPHERE) 160-9-4.8 MCG/ACT AERO, Inhale 2 puffs into the lungs in the morning and at bedtime., Disp: 5.9 g, Rfl: 11   buPROPion (WELLBUTRIN XL) 150 MG 24 hr tablet, Take 150 mg by mouth daily., Disp: , Rfl:    Cholecalciferol (VITAMIN D-3) 125 MCG (5000 UT) TABS, Take 5,000 Units by mouth daily., Disp: 30 tablet, Rfl: 1   gabapentin (NEURONTIN) 300 MG capsule, Take 300 mg by mouth 2 (two) times daily., Disp: , Rfl:    hydrOXYzine (VISTARIL) 50 MG capsule, Take 50 mg by mouth 2 (two) times daily as needed for anxiety., Disp: , Rfl:    icosapent Ethyl (VASCEPA) 1 g capsule, TAKE 2 CAPSULES BY MOUTH TWICE A DAY, Disp: 120 capsule, Rfl:  5   INVEGA SUSTENNA 234 MG/1.5ML SUSY injection, Inject into the muscle. Inject as directed once a month, Disp: , Rfl:    levothyroxine (SYNTHROID) 75 MCG tablet, TAKE 1 TABLET BY MOUTH DAILY AT 6 AM, Disp: 30 tablet, Rfl: 0   losartan (COZAAR) 50 MG tablet, Take 1 tablet (50 mg total) by mouth daily., Disp: 90 tablet, Rfl: 1   lubiprostone (AMITIZA) 24 MCG capsule, TAKE 1 CAPSULE BY MOUTH TWICE A DAY WITHA MEAL, Disp: 180 capsule, Rfl: 0   malathion (OVIDE) 0.5 % lotion, Apply topically., Disp: , Rfl:    nicotine (NICOTROL) 10 MG inhaler, Inhale 1 Cartridge (1 continuous puffing total) into the lungs as needed for smoking cessation., Disp: 36 each, Rfl: 3   oxybutynin (DITROPAN-XL) 10 MG 24 hr  tablet, TAKE 1 TABLET BY MOUTH EACH DAY, Disp: 90 tablet, Rfl: 3   pantoprazole (PROTONIX) 40 MG tablet, Take 1 tablet (40 mg total) by mouth daily., Disp: 90 tablet, Rfl: 1   prazosin (MINIPRESS) 2 MG capsule, Take 2 mg by mouth at bedtime., Disp: , Rfl:    rosuvastatin (CRESTOR) 40 MG tablet, TAKE 1 TABLET BY MOUTH DAILY, Disp: 30 tablet, Rfl: 5   sertraline (ZOLOFT) 100 MG tablet, Take 200 mg by mouth daily., Disp: , Rfl:   Allergies  Allergen Reactions   Metformin And Related Nausea And Vomiting   Penicillins Other (See Comments)    She has taken amoxicillin without problems   Perphenazine Other (See Comments)    Tremors, muscle weakness, tongue swelling   Sulfa Antibiotics Other (See Comments)    GI distress    Abilify [Aripiprazole] Rash    I personally reviewed active problem list, medication list, allergies, family history, social history, health maintenance with the patient/caregiver today.   ROS  Constitutional: Negative for fever or weight change.  Respiratory: positive for cough and shortness of breath.   Cardiovascular: Negative for chest pain or palpitations.  Gastrointestinal: positive for abdominal pain, but no bowel changes.  Musculoskeletal: Negative for gait problem or  joint swelling.  Skin: Negative for rash.  Neurological: Negative for dizziness or headache.  No other specific complaints in a complete review of systems (except as listed in HPI above).   Objective  Vitals:   06/20/21 1201  BP: 136/84  Pulse: 82  Resp: 16  Temp: 98.4 F (36.9 C)  TempSrc: Oral  SpO2: 99%  Weight: 153 lb (69.4 kg)  Height: '4\' 11"'  (1.499 m)    Body mass index is 30.9 kg/m.  Physical Exam  Constitutional: Patient appears well-developed and well-nourished. Obese  No distress.  HEENT: head atraumatic, normocephalic, pupils equal and reactive to light, neck supple Cardiovascular: Normal rate, regular rhythm and normal heart sounds.  No murmur heard. No BLE edema. Pulmonary/Chest: Effort normal but has diffuse rhonchi and some expiratory wheezing  Abdominal: Soft.  There is no tenderness. Psychiatric: Patient has a normal mood and affect. behavior is normal. Judgment and thought content normal.   Recent Results (from the past 2160 hour(s))  POCT HgB A1C     Status: Abnormal   Collection Time: 06/20/21 11:53 AM  Result Value Ref Range   Hemoglobin A1C 6.1 (A) 4.0 - 5.6 %   HbA1c POC (<> result, manual entry)     HbA1c, POC (prediabetic range)     HbA1c, POC (controlled diabetic range)    TSH     Status: None   Collection Time: 06/20/21 12:34 PM  Result Value Ref Range   TSH 2.28 mIU/L    Comment:           Reference Range .           > or = 20 Years  0.40-4.50 .                Pregnancy Ranges           First trimester    0.26-2.66           Second trimester   0.55-2.73           Third trimester    0.43-2.91   COMPLETE METABOLIC PANEL WITH GFR     Status: Abnormal   Collection Time: 06/20/21 12:34 PM  Result Value Ref Range   Glucose, Bld 91 65 -  99 mg/dL    Comment: .            Fasting reference interval .    BUN 10 7 - 25 mg/dL   Creat 0.97 0.50 - 1.10 mg/dL   GFR, Est Non African American 69 > OR = 60 mL/min/1.21m   GFR, Est African  American 80 > OR = 60 mL/min/1.729m  BUN/Creatinine Ratio NOT APPLICABLE 6 - 22 (calc)   Sodium 139 135 - 146 mmol/L   Potassium 3.8 3.5 - 5.3 mmol/L   Chloride 99 98 - 110 mmol/L   CO2 31 20 - 32 mmol/L   Calcium 10.7 (H) 8.6 - 10.2 mg/dL   Total Protein 6.9 6.1 - 8.1 g/dL   Albumin 4.2 3.6 - 5.1 g/dL   Globulin 2.7 1.9 - 3.7 g/dL (calc)   AG Ratio 1.6 1.0 - 2.5 (calc)   Total Bilirubin 0.5 0.2 - 1.2 mg/dL   Alkaline phosphatase (APISO) 112 31 - 125 U/L   AST 14 10 - 35 U/L   ALT 12 6 - 29 U/L  CBC with Differential/Platelet     Status: Abnormal   Collection Time: 06/20/21 12:34 PM  Result Value Ref Range   WBC 11.2 (H) 3.8 - 10.8 Thousand/uL   RBC 4.96 3.80 - 5.10 Million/uL   Hemoglobin 15.8 (H) 11.7 - 15.5 g/dL   HCT 47.3 (H) 35.0 - 45.0 %   MCV 95.4 80.0 - 100.0 fL   MCH 31.9 27.0 - 33.0 pg   MCHC 33.4 32.0 - 36.0 g/dL   RDW 13.9 11.0 - 15.0 %   Platelets 192 140 - 400 Thousand/uL   MPV 9.6 7.5 - 12.5 fL   Neutro Abs 7,941 (H) 1,500 - 7,800 cells/uL   Lymphs Abs 2,464 850 - 3,900 cells/uL   Absolute Monocytes 616 200 - 950 cells/uL   Eosinophils Absolute 112 15 - 500 cells/uL   Basophils Absolute 67 0 - 200 cells/uL   Neutrophils Relative % 70.9 %   Total Lymphocyte 22.0 %   Monocytes Relative 5.5 %   Eosinophils Relative 1.0 %   Basophils Relative 0.6 %  Microalbumin / creatinine urine ratio     Status: Abnormal   Collection Time: 06/20/21 12:34 PM  Result Value Ref Range   Creatinine, Urine 136 20 - 275 mg/dL   Microalb, Ur 22.2 mg/dL    Comment: Reference Range Not established    Microalb Creat Ratio 163 (H) <30 mcg/mg creat    Comment: . The ADA defines abnormalities in albumin excretion as follows: . Marland Kitchenlbuminuria Category        Result (mcg/mg creatinine) . Normal to Mildly increased   <30 Moderately increased         30-299  Severely increased           > OR = 300 . The ADA recommends that at least two of three specimens collected within a 3-6  month period be abnormal before considering a patient to be within a diagnostic category.   POCT urinalysis dipstick     Status: Abnormal   Collection Time: 06/20/21  2:34 PM  Result Value Ref Range   Color, UA yellow    Clarity, UA clear    Glucose, UA Negative Negative   Bilirubin, UA neg    Ketones, UA neg    Spec Grav, UA 1.020 1.010 - 1.025   Blood, UA negative    pH, UA 7.5 5.0 - 8.0   Protein, UA  Positive (A) Negative   Urobilinogen, UA 0.2 0.2 or 1.0 E.U./dL   Nitrite, UA neg    Leukocytes, UA Large (3+) (A) Negative   Appearance clear    Odor normal      PHQ2/9: Depression screen [facility] 2/9 06/20/2021 02/14/2021 01/19/2021 11/03/2020 10/13/2020  Decreased Interest 2 0 0 1 3  Down, Depressed, Hopeless 0 '1 1 1 1  ' PHQ - 2 Score '2 1 1 2 4  ' Altered sleeping 1 1 0 2 1  Tired, decreased energy '3 1 3 2 3  ' Change in appetite 3 1 0 3 2  Feeling bad or failure about yourself  0 0 0 2 0  Trouble concentrating 0 '2 2 3 2  ' Moving slowly or fidgety/restless 0 0 0 0 2  Suicidal thoughts 0 0 0 0 0  PHQ-9 Score '9 6 6 14 14  ' Difficult doing work/chores - - - Somewhat difficult Very difficult  Some recent data might be hidden    phq 9 is positive  Fall Risk: Fall Risk  06/20/2021 02/14/2021 01/19/2021 11/03/2020 11/03/2020  Falls in the past year? 0 0 0 0 0  Number falls in past yr: 0 0 0 0 0  Injury with Fall? 0 0 0 0 0  Risk for fall due to : - - - - -  Follow up - - - - -     Functional Status Survey: Is the patient deaf or have difficulty hearing?: No Does the patient have difficulty seeing, even when wearing glasses/contacts?: No Does the patient have difficulty concentrating, remembering, or making decisions?: No Does the patient have difficulty walking or climbing stairs?: No Does the patient have difficulty dressing or bathing?: No Does the patient have difficulty doing errands alone such as visiting a doctor's office or shopping?: No    Assessment & Plan  1. Type 2  diabetes mellitus with microalbuminuria, without long-term current use of insulin (HCC)  - POCT HgB A1C - Urine Microalbumin w/creat. ratio  2. Stage 3a chronic kidney disease (HCC)  - COMPLETE METABOLIC PANEL WITH GFR - CBC with Differential/Platelet  3. Chronic obstructive pulmonary disease, unspecified COPD type ([place])   4. Bipolar affective disorder, currently depressed, moderate ([place])   5. GERD without esophagitis   6. Hypothyroidism due to medication  - TSH  7. Dyslipidemia associated with type 2 diabetes mellitus ([place])   8. Chronic constipation   9. Psoriasis   10. Tobacco dependence   11. Dysuria  - Urine Culture - CBC with Differential/Platelet

## 2021-06-20 ENCOUNTER — Ambulatory Visit (INDEPENDENT_AMBULATORY_CARE_PROVIDER_SITE_OTHER): Payer: Medicare Other | Admitting: Family Medicine

## 2021-06-20 ENCOUNTER — Telehealth: Payer: Self-pay

## 2021-06-20 ENCOUNTER — Other Ambulatory Visit: Payer: Self-pay

## 2021-06-20 ENCOUNTER — Encounter: Payer: Self-pay | Admitting: Family Medicine

## 2021-06-20 VITALS — BP 136/84 | HR 82 | Temp 98.4°F | Resp 16 | Ht 59.0 in | Wt 153.0 lb

## 2021-06-20 DIAGNOSIS — J449 Chronic obstructive pulmonary disease, unspecified: Secondary | ICD-10-CM | POA: Diagnosis not present

## 2021-06-20 DIAGNOSIS — K5909 Other constipation: Secondary | ICD-10-CM

## 2021-06-20 DIAGNOSIS — L409 Psoriasis, unspecified: Secondary | ICD-10-CM

## 2021-06-20 DIAGNOSIS — E032 Hypothyroidism due to medicaments and other exogenous substances: Secondary | ICD-10-CM | POA: Diagnosis not present

## 2021-06-20 DIAGNOSIS — F17200 Nicotine dependence, unspecified, uncomplicated: Secondary | ICD-10-CM | POA: Diagnosis not present

## 2021-06-20 DIAGNOSIS — E1129 Type 2 diabetes mellitus with other diabetic kidney complication: Secondary | ICD-10-CM

## 2021-06-20 DIAGNOSIS — F3132 Bipolar disorder, current episode depressed, moderate: Secondary | ICD-10-CM

## 2021-06-20 DIAGNOSIS — R300 Dysuria: Secondary | ICD-10-CM | POA: Diagnosis not present

## 2021-06-20 DIAGNOSIS — E785 Hyperlipidemia, unspecified: Secondary | ICD-10-CM

## 2021-06-20 DIAGNOSIS — N1831 Chronic kidney disease, stage 3a: Secondary | ICD-10-CM

## 2021-06-20 DIAGNOSIS — E1169 Type 2 diabetes mellitus with other specified complication: Secondary | ICD-10-CM | POA: Diagnosis not present

## 2021-06-20 DIAGNOSIS — R809 Proteinuria, unspecified: Secondary | ICD-10-CM | POA: Diagnosis not present

## 2021-06-20 DIAGNOSIS — K219 Gastro-esophageal reflux disease without esophagitis: Secondary | ICD-10-CM

## 2021-06-20 LAB — POCT URINALYSIS DIPSTICK
Bilirubin, UA: NEGATIVE
Blood, UA: NEGATIVE
Glucose, UA: NEGATIVE
Ketones, UA: NEGATIVE
Nitrite, UA: NEGATIVE
Odor: NORMAL
Protein, UA: POSITIVE — AB
Spec Grav, UA: 1.02 (ref 1.010–1.025)
Urobilinogen, UA: 0.2 E.U./dL
pH, UA: 7.5 (ref 5.0–8.0)

## 2021-06-20 LAB — POCT GLYCOSYLATED HEMOGLOBIN (HGB A1C): Hemoglobin A1C: 6.1 % — AB (ref 4.0–5.6)

## 2021-06-20 NOTE — Telephone Encounter (Signed)
Pt did a culture today and if she is needing any medication is asking that you send to [email or web address]

## 2021-06-21 ENCOUNTER — Other Ambulatory Visit: Payer: Self-pay | Admitting: Family Medicine

## 2021-06-21 DIAGNOSIS — K5909 Other constipation: Secondary | ICD-10-CM

## 2021-06-22 LAB — CBC WITH DIFFERENTIAL/PLATELET
Absolute Monocytes: 616 cells/uL (ref 200–950)
Basophils Absolute: 67 cells/uL (ref 0–200)
Basophils Relative: 0.6 %
Eosinophils Absolute: 112 cells/uL (ref 15–500)
Eosinophils Relative: 1 %
HCT: 47.3 % — ABNORMAL HIGH (ref 35.0–45.0)
Hemoglobin: 15.8 g/dL — ABNORMAL HIGH (ref 11.7–15.5)
Lymphs Abs: 2464 cells/uL (ref 850–3900)
MCH: 31.9 pg (ref 27.0–33.0)
MCHC: 33.4 g/dL (ref 32.0–36.0)
MCV: 95.4 fL (ref 80.0–100.0)
MPV: 9.6 fL (ref 7.5–12.5)
Monocytes Relative: 5.5 %
Neutro Abs: 7941 cells/uL — ABNORMAL HIGH (ref 1500–7800)
Neutrophils Relative %: 70.9 %
Platelets: 192 10*3/uL (ref 140–400)
RBC: 4.96 10*6/uL (ref 3.80–5.10)
RDW: 13.9 % (ref 11.0–15.0)
Total Lymphocyte: 22 %
WBC: 11.2 10*3/uL — ABNORMAL HIGH (ref 3.8–10.8)

## 2021-06-22 LAB — COMPLETE METABOLIC PANEL WITH GFR
AG Ratio: 1.6 (calc) (ref 1.0–2.5)
ALT: 12 U/L (ref 6–29)
AST: 14 U/L (ref 10–35)
Albumin: 4.2 g/dL (ref 3.6–5.1)
Alkaline phosphatase (APISO): 112 U/L (ref 31–125)
BUN: 10 mg/dL (ref 7–25)
CO2: 31 mmol/L (ref 20–32)
Calcium: 10.7 mg/dL — ABNORMAL HIGH (ref 8.6–10.2)
Chloride: 99 mmol/L (ref 98–110)
Creat: 0.97 mg/dL (ref 0.50–1.10)
GFR, Est African American: 80 mL/min/{1.73_m2} (ref >=60)
GFR, Est Non African American: 69 mL/min/{1.73_m2} (ref >=60)
Globulin: 2.7 g/dL (calc) (ref 1.9–3.7)
Glucose, Bld: 91 mg/dL (ref 65–99)
Potassium: 3.8 mmol/L (ref 3.5–5.3)
Sodium: 139 mmol/L (ref 135–146)
Total Bilirubin: 0.5 mg/dL (ref 0.2–1.2)
Total Protein: 6.9 g/dL (ref 6.1–8.1)

## 2021-06-22 LAB — MICROALBUMIN / CREATININE URINE RATIO
Creatinine, Urine: 136 mg/dL (ref 20–275)
Microalb Creat Ratio: 163 mcg/mg creat — ABNORMAL HIGH (ref <30)
Microalb, Ur: 22.2 mg/dL

## 2021-06-22 LAB — URINE CULTURE
MICRO NUMBER:: 12054518
SPECIMEN QUALITY:: ADEQUATE

## 2021-06-22 LAB — TSH: TSH: 2.28 mIU/L

## 2021-06-23 ENCOUNTER — Other Ambulatory Visit: Payer: Self-pay | Admitting: Family Medicine

## 2021-06-23 DIAGNOSIS — N3090 Cystitis, unspecified without hematuria: Secondary | ICD-10-CM

## 2021-06-23 MED ORDER — CIPROFLOXACIN HCL 250 MG PO TABS: 250.0000 mg | ORAL_TABLET | Freq: Two times a day (BID) | ORAL | 0 refills | Status: AC

## 2021-06-30 ENCOUNTER — Ambulatory Visit: Payer: Medicare Other

## 2021-07-04 ENCOUNTER — Other Ambulatory Visit: Payer: Self-pay | Admitting: Family Medicine

## 2021-07-04 DIAGNOSIS — E032 Hypothyroidism due to medicaments and other exogenous substances: Secondary | ICD-10-CM

## 2021-07-07 ENCOUNTER — Ambulatory Visit (INDEPENDENT_AMBULATORY_CARE_PROVIDER_SITE_OTHER): Payer: Medicare Other

## 2021-07-07 DIAGNOSIS — Z748 Other problems related to care provider dependency: Secondary | ICD-10-CM | POA: Diagnosis not present

## 2021-07-07 DIAGNOSIS — Z0000 Encounter for general adult medical examination without abnormal findings: Secondary | ICD-10-CM | POA: Diagnosis not present

## 2021-07-07 NOTE — Progress Notes (Signed)
Subjective:   April Luna is a 50 y.o. female who presents for Medicare Annual (Subsequent) preventive examination.  Virtual Visit via Telephone Note  I connected with  [place] on 07/07/21 at 10:40 AM EDT by telephone and verified that I am speaking with the correct person using two identifiers.  Location: Patient: home Provider: [place] Persons participating in the virtual visit: [place]   I discussed the limitations, risks, security and privacy concerns of performing an evaluation and management service by telephone and the availability of in person appointments. The patient expressed understanding and agreed to proceed.  Interactive audio and video telecommunications were attempted between this nurse and patient, however failed, due to patient having technical difficulties OR patient did not have access to video capability.  We continued and completed visit with audio only.  Some vital signs may be absent or patient reported.   Clemetine Marker, LPN   Review of Systems     Cardiac Risk Factors include: advanced age (>27mn, >>70women);diabetes mellitus;dyslipidemia;obesity (BMI >30kg/m2);smoking/ tobacco exposure     Objective:    There were no vitals filed for this visit. There is no height or weight on file to calculate BMI.  Advanced Directives 07/07/2021 06/29/2020 02/04/2020 08/08/2019 02/04/2019 11/08/2018 11/06/2018  Does Patient Have a Medical Advance Directive? No No No No No No No  Does patient want to make changes to medical advance directive? No - Patient declined - - - - - -  Would patient like information on creating a medical advance directive? - No - Patient declined - No - Patient declined Yes (MAU/Ambulatory/Procedural Areas - Information given) - Yes (MAU/Ambulatory/Procedural Areas - Information given)  Some encounter information is confidential and restricted. Go to Review Flowsheets activity to see all data.    Current  Medications (verified) Outpatient Encounter Medications as of 07/07/2021  Medication Sig   albuterol (PROVENTIL) (2.5 MG/3ML) 0.083% nebulizer solution Take 3 mLs (2.5 mg total) by nebulization every 6 (six) hours as needed for wheezing or shortness of breath.   amantadine (SYMMETREL) 100 MG capsule Take 100 mg by mouth 2 (two) times daily.   Budeson-Glycopyrrol-Formoterol (BREZTRI AEROSPHERE) 160-9-4.8 MCG/ACT AERO Inhale 2 puffs into the lungs in the morning and at bedtime.   buPROPion (WELLBUTRIN XL) 150 MG 24 hr tablet Take 150 mg by mouth daily.   Cholecalciferol (VITAMIN D-3) 125 MCG (5000 UT) TABS Take 5,000 Units by mouth daily.   gabapentin (NEURONTIN) 300 MG capsule Take 300 mg by mouth 2 (two) times daily.   hydrOXYzine (VISTARIL) 50 MG capsule Take 50 mg by mouth 2 (two) times daily as needed for anxiety.   icosapent Ethyl (VASCEPA) 1 g capsule TAKE 2 CAPSULES BY MOUTH TWICE A DAY   INVEGA SUSTENNA 234 MG/1.5ML SUSY injection Inject into the muscle. Inject as directed once a month   levothyroxine (SYNTHROID) 75 MCG tablet TAKE 1 TABLET BY MOUTH DAILY AT 6 AM   loratadine (CLARITIN) 10 MG tablet Take 10 mg by mouth daily.   losartan (COZAAR) 50 MG tablet Take 1 tablet (50 mg total) by mouth daily.   malathion (OVIDE) 0.5 % lotion Apply topically.   pantoprazole (PROTONIX) 40 MG tablet Take 1 tablet (40 mg total) by mouth daily.   rosuvastatin (CRESTOR) 40 MG tablet TAKE 1 TABLET BY MOUTH DAILY   sertraline (ZOLOFT) 100 MG tablet Take 200 mg by mouth daily.   blood glucose meter kit and supplies Dispense based on patient and insurance preference.  Use up to four times daily as directed. (FOR ICD-10 E10.9, E11.9). (Patient not taking: Reported on 07/07/2021)   nicotine (NICOTROL) 10 MG inhaler Inhale 1 Cartridge (1 continuous puffing total) into the lungs as needed for smoking cessation. (Patient not taking: Reported on 07/07/2021)   [DISCONTINUED] lubiprostone (AMITIZA) 24 MCG capsule TAKE  1 CAPSULE BY MOUTH TWICE A DAY WITHA MEAL   [DISCONTINUED] oxybutynin (DITROPAN-XL) 10 MG 24 hr tablet TAKE 1 TABLET BY MOUTH EACH DAY   [DISCONTINUED] prazosin (MINIPRESS) 2 MG capsule Take 2 mg by mouth at bedtime.   No facility-administered encounter medications on file as of 07/07/2021.    Allergies (verified) Metformin and related, Penicillins, Perphenazine, Sulfa antibiotics, and Abilify [aripiprazole]   History: Past Medical History:  Diagnosis Date   Allergy    pollen   Anxiety    Bipolar 1 disorder ([place])    Chronic kidney disease    COPD (chronic obstructive pulmonary disease) (HCC)    Depression    Family history of adverse reaction to anesthesia    grand father had a stroke during anesthesia   GERD (gastroesophageal reflux disease)    History of kidney stones    Hyperlipidemia    Hypertension    Hypothyroidism    Pneumonia    Type 2 diabetes mellitus with microalbuminuria, without long-term current use of insulin ([place]) 06/24/2019   Past Surgical History:  Procedure Laterality Date   CESAREAN SECTION     CYSTOSCOPY W/ RETROGRADES Left 11/08/2018   Procedure: CYSTOSCOPY WITH RETROGRADE PYELOGRAM;  Surgeon: Billey Co, MD;  Location: [facility] ORS;  Service: Urology;  Laterality: Left;   CYSTOSCOPY/URETEROSCOPY/HOLMIUM LASER/STENT PLACEMENT Left 11/08/2018   Procedure: CYSTOSCOPY/URETEROSCOPY/HOLMIUM LASER/STENT PLACEMENT;  Surgeon: Billey Co, MD;  Location: [facility] OR[facility];  Service: Urology;  Laterality: Left;   MOUTH SURGERY     wisdom teeth extraction   Family History  Problem Relation Age of Onset   Depression Mother    Anxiety disorder Mother    Diabetes Mother    Hypertension Mother    Hyperlipidemia Mother    Cancer Mother    Cancer Father        colon cancer   Depression Brother    Anxiety disorder Brother    Diabetes Mellitus II Maternal Grandmother    Hypercholesterolemia Maternal Grandmother    Cancer Paternal Grandmother    Diabetes Paternal  Grandmother    Social History   Socioeconomic History   Marital status: Married    Spouse name: Montine Circle   Number of children: 1   Years of education: 12   Highest education level: High school graduate  Occupational History   Occupation: unemployed    Comment: disabled  Tobacco Use   Smoking status: Every Day    Packs/day: 1.00    Years: 34.00    Pack years: 34.00    Types: Cigarettes    Start date: 05/13/1985   Smokeless tobacco: Never   Tobacco comments:    1 PPD 02/24/2021  Vaping Use   Vaping Use: Former   Start date: 05/25/2018   Quit date: 09/24/2018  Substance and Sexual Activity   Alcohol use: Not Currently    Alcohol/week: 4.0 standard drinks    Types: 4 Glasses of wine per week    Comment: occasional   Drug use: Not Currently    Types: Marijuana    Comment: none since August 2019   Sexual activity: Not Currently    Birth control/protection: Post-menopausal  Other Topics  Concern   Not on file  Social History Narrative   Not on file   Social Determinants of Health   Financial Resource Strain: Low Risk    Difficulty of Paying Living Expenses: Not hard at all  Food Insecurity: No Food Insecurity   Worried About Charity fundraiser in the Last Year: Never true   Arboriculturist in the Last Year: Never true  Transportation Needs: Unmet Transportation Needs   Lack of Transportation (Medical): Yes   Lack of Transportation (Non-Medical): Yes  Physical Activity: Inactive   Days of Exercise per Week: 0 days   Minutes of Exercise per Session: 0 min  Stress: Stress Concern Present   Feeling of Stress : To some extent  Social Connections: Moderately Isolated   Frequency of Communication with Friends and Family: More than three times a week   Frequency of Social Gatherings with Friends and Family: Twice a week   Attends Religious Services: Never   Marine scientist or Organizations: No   Attends Music therapist: Never   Marital Status:  Married    Tobacco Counseling Ready to quit: Yes Counseling given: Yes Tobacco comments: 1 PPD 02/24/2021   Clinical Intake:  Pre-visit preparation completed: Yes  Pain : No/denies pain     Nutritional Risks: None Diabetes: Yes CBG done?: No Did pt. bring in CBG monitor from home?: No  How often do you need to have someone help you when you read instructions, pamphlets, or other written materials from your doctor or pharmacy?: 1 - Never  Nutrition Risk Assessment:  Has the patient had any N/V/D within the last 2 months?  No  Does the patient have any non-healing wounds?  No  Has the patient had any unintentional weight loss or weight gain?  No   Diabetes:  Is the patient diabetic?  Yes  If diabetic, was a CBG obtained today?  No  Did the patient bring in their glucometer from home?  No  How often do you monitor your CBG's? Pt does not actively check blood sugar.   Financial Strains and Diabetes Management:  Are you having any financial strains with the device, your supplies or your medication? No .  Does the patient want to be seen by Chronic Care Management for management of their diabetes?  No  Would the patient like to be referred to a Nutritionist or for Diabetic Management?  No   Diabetic Exams:  Diabetic Eye Exam: Overdue for diabetic eye exam. Pt has been advised about the importance in completing this exam.   Diabetic Foot Exam: Completed 11/03/20.   Interpreter Needed?: No  Information entered by :: Clemetine Marker LPN   Activities of Daily Living In your present state of health, do you have any difficulty performing the following activities: 07/07/2021 06/20/2021  Hearing? N N  Comment declines hearing aids -  Vision? N N  Difficulty concentrating or making decisions? N N  Walking or climbing stairs? N N  Dressing or bathing? N N  Doing errands, shopping? N N  Preparing Food and eating ? N -  Using the Toilet? N -  In the past six months, have you  accidently leaked urine? N -  Do you have problems with loss of bowel control? N -  Managing your Medications? N -  Managing your Finances? N -  Housekeeping or managing your Housekeeping? N -  Some recent data might be hidden    Patient Care Team:  Steele Sizer, MD as PCP - General (Family Medicine) Sharmaine Base, MD as Referring Physician (Psychiatry) Bjorn Loser, MD as Consulting Physician (Urology)  Indicate any recent Medical Services you may have received from other than [facility] providers in the past year (date may be approximate).     Assessment:   This is a routine wellness examination for [place].  Hearing/Vision screen Hearing Screening - Comments:: Pt denies hearing difficulty Vision Screening - Comments:: Past due for eye exam; plans to contact [facility] for appt  Dietary issues and exercise activities discussed: Current Exercise Habits: The patient does not participate in regular exercise at present, Exercise limited by: None identified   Goals Addressed   None    Depression Screen PHQ 2/9 Scores 07/07/2021 06/20/2021 02/14/2021 01/19/2021 11/03/2020 10/13/2020 06/29/2020  PHQ - 2 Score 0 '2 1 1 2 4 ' 0  PHQ- 9 Score '6 9 6 6 14 14 ' -    Fall Risk Fall Risk  07/07/2021 06/20/2021 02/14/2021 01/19/2021 11/03/2020  Falls in the past year? 1 0 0 0 0  Number falls in past yr: 0 0 0 0 0  Injury with Fall? 0 0 0 0 0  Risk for fall due to : No Fall Risks - - - -  Follow up Falls prevention discussed - - - -    FALL RISK PREVENTION PERTAINING TO THE HOME:  Any stairs in or around the home? Yes  If so, are there any without handrails? No  Home free of loose throw rugs in walkways, pet beds, electrical cords, etc? Yes  Adequate lighting in your home to reduce risk of falls? Yes   ASSISTIVE DEVICES UTILIZED TO PREVENT FALLS:  Life alert? No  Use of a cane, walker or w/c? No  Grab bars in the bathroom? No  Shower chair or bench in shower? No  Elevated  toilet seat or a handicapped toilet? No   TIMED UP AND GO:  Was the test performed? No . Telephonic visit.   Cognitive Function: Normal cognitive status assessed by direct observation by this Nurse Health Advisor. No abnormalities found.       6CIT Screen 06/29/2020 02/04/2019  What Year? 0 points 0 points  What month? 0 points 0 points  What time? 0 points 0 points  Count back from 20 0 points 0 points  Months in reverse 0 points 0 points  Repeat phrase 0 points 0 points  Total Score 0 0    Immunizations Immunization History  Administered Date(s) Administered   Influenza,inj,Quad PF,6+ Mos 09/02/2018, 09/24/2019, 11/03/2020   PPD Test 05/10/2010   Pneumococcal Polysaccharide-23 05/14/2018   Tdap 06/30/2015   Zoster Recombinat (Shingrix) 02/14/2021    TDAP status: Up to date  Flu Vaccine status: Up to date  Pneumococcal vaccine status: Up to date  Covid-19 vaccine status: Declined, Education has been provided regarding the importance of this vaccine but patient still declined. Advised may receive this vaccine at local pharmacy or Health Dept.or vaccine clinic. Aware to provide a copy of the vaccination record if obtained from local pharmacy or Health Dept. Verbalized acceptance and understanding.  Qualifies for Shingles Vaccine? Yes   Zostavax completed No   Shingrix Completed?: Yes  Screening Tests Health Maintenance  Topic Date Due   OPHTHALMOLOGY EXAM  Never done   MAMMOGRAM  Never done   COLONOSCOPY (Pts 45-31yr Insurance coverage will need to be confirmed)  Never done   Pneumococcal Vaccine 013659Years old (2 - PCV) 05/15/2019  COVID-19 Vaccine (1) 07/23/2021 (Originally 11/28/1976)   INFLUENZA VACCINE  07/25/2021   FOOT EXAM  11/03/2021   HEMOGLOBIN A1C  12/20/2021   PAP SMEAR-Modifier  02/14/2023   TETANUS/TDAP  06/29/2025   PNEUMOCOCCAL POLYSACCHARIDE VACCINE AGE 53-64 HIGH RISK  Completed   Hepatitis C Screening  Completed   HIV Screening  Completed    HPV VACCINES  Aged Out    Health Maintenance  Health Maintenance Due  Topic Date Due   OPHTHALMOLOGY EXAM  Never done   MAMMOGRAM  Never done   COLONOSCOPY (Pts 45-6yr Insurance coverage will need to be confirmed)  Never done   Pneumococcal Vaccine [id number] old (2 - PCV) 05/15/2019    Colorectal cancer screening: due; pt recently seen by GI; plans to contact their office to reschedule colonoscopy  Mammogram status: Ordered 02/14/21. Pt provided with contact info and advised to call to schedule appt.   Bone density status: due age 50 Lung Cancer Screening: (Low Dose CT Chest recommended if Age 49-80years, 30 pack-year currently smoking OR have quit w/in 15years.) does not qualify.   Additional Screening:  Hepatitis C Screening: does qualify; Completed 11/03/20  Vision Screening: Recommended annual ophthalmology exams for early detection of glaucoma and other disorders of the eye. Is the patient up to date with their annual eye exam?  No  Who is the provider or what is the name of the office in which the patient attends annual eye exams? [facility]   Dental Screening: Recommended annual dental exams for proper oral hygiene  Community Resource Referral / Chronic Care Management: CRR required this visit?  Yes   CCM required this visit?  No      Plan:     I have personally reviewed and noted the following in the patient's chart:   Medical and social history Use of alcohol, tobacco or illicit drugs  Current medications and supplements including opioid prescriptions.  Functional ability and status Nutritional status Physical activity Advanced directives List of other physicians Hospitalizations, surgeries, and ER visits in previous 12 months Vitals Screenings to include cognitive, depression, and falls Referrals and appointments  In addition, I have reviewed and discussed with patient certain preventive protocols, quality metrics, and best practice  recommendations. A written personalized care plan for preventive services as well as general preventive health recommendations were provided to patient.     KClemetine Marker LPN   [id number]  Nurse Notes: none

## 2021-07-07 NOTE — Patient Instructions (Signed)
[name] , Thank you for taking time to come for your Medicare Wellness Visit. I appreciate your ongoing commitment to your health goals. Please review the following plan we discussed and let me know if I can assist you in the future.   Screening recommendations/referrals: Colonoscopy: due - please contact [facility] at [phone] to schedule your colonoscopy Mammogram: due. Please call [phone] to schedule your mammogram.  Bone Density: due age 50 Recommended yearly ophthalmology/optometry visit for glaucoma screening and checkup Recommended yearly dental visit for hygiene and checkup  Vaccinations: Influenza vaccine: done 11/03/20 Pneumococcal vaccine: done 05/14/18 Tdap vaccine: done 06/30/15 Shingles vaccine: done 02/14/21; due for second dose  Covid-19: declined  Advanced directives: Advance directive discussed with you today. Even though you declined this today please call our office should you change your mind and we can give you the proper paperwork for you to fill out.   Conditions/risks identified: Recommend increasing physical activity to at least 3 days per week   Next appointment: Follow up in one year for your annual wellness visit.   Preventive Care 40-64 Years, Female Preventive care refers to lifestyle choices and visits with your health care provider that can promote health and wellness. What does preventive care include? A yearly physical exam. This is also called an annual well check. Dental exams once or twice a year. Routine eye exams. Ask your health care provider how often you should have your eyes checked. Personal lifestyle choices, including: Daily care of your teeth and gums. Regular physical activity. Eating a healthy diet. Avoiding tobacco and drug use. Limiting alcohol use. Practicing safe sex. Taking low-dose aspirin daily starting at age 4. Taking vitamin and mineral supplements as recommended by your health care  provider. What happens during an annual well check? The services and screenings done by your health care provider during your annual well check will depend on your age, overall health, lifestyle risk factors, and family history of disease. Counseling  Your health care provider may ask you questions about your: Alcohol use. Tobacco use. Drug use. Emotional well-being. Home and relationship well-being. Sexual activity. Eating habits. Work and work Statistician. Method of birth control. Menstrual cycle. Pregnancy history. Screening  You may have the following tests or measurements: Height, weight, and BMI. Blood pressure. Lipid and cholesterol levels. These may be checked every 5 years, or more frequently if you are over 70 years old. Skin check. Lung cancer screening. You may have this screening every year starting at age 26 if you have a 30-pack-year history of smoking and currently smoke or have quit within the past 15 years. Fecal occult blood test (FOBT) of the stool. You may have this test every year starting at age 49. Flexible sigmoidoscopy or colonoscopy. You may have a sigmoidoscopy every 5 years or a colonoscopy every 10 years starting at age 37. Hepatitis C blood test. Hepatitis B blood test. Sexually transmitted disease (STD) testing. Diabetes screening. This is done by checking your blood sugar (glucose) after you have not eaten for a while (fasting). You may have this done every 1-3 years. Mammogram. This may be done every 1-2 years. Talk to your health care provider about when you should start having regular mammograms. This may depend on whether you have a family history of breast cancer. BRCA-related cancer screening. This may be done if you have a family history of breast, ovarian, tubal, or peritoneal cancers. Pelvic exam and Pap test. This may be done every 3 years starting at age  21. Starting at age 79, this may be done every 5 years if you have a Pap test in  combination with an HPV test. Bone density scan. This is done to screen for osteoporosis. You may have this scan if you are at high risk for osteoporosis. Discuss your test results, treatment options, and if necessary, the need for more tests with your health care provider. Vaccines  Your health care provider may recommend certain vaccines, such as: Influenza vaccine. This is recommended every year. Tetanus, diphtheria, and acellular pertussis (Tdap, Td) vaccine. You may need a Td booster every 10 years. Zoster vaccine. You may need this after age 63. Pneumococcal 13-valent conjugate (PCV13) vaccine. You may need this if you have certain conditions and were not previously vaccinated. Pneumococcal polysaccharide (PPSV23) vaccine. You may need one or two doses if you smoke cigarettes or if you have certain conditions. Talk to your health care provider about which screenings and vaccines you need and how often you need them. This information is not intended to replace advice given to you by your health care provider. Make sure you discuss any questions you have with your health care provider. Document Released: 01/07/2016 Document Revised: 08/30/2016 Document Reviewed: 10/12/2015 Elsevier Interactive Patient Education  2017 [place] Prevention in the Home Falls can cause injuries. They can happen to people of all ages. There are many things you can do to make your home safe and to help prevent falls. What can I do on the outside of my home? Regularly fix the edges of walkways and driveways and fix any cracks. Remove anything that might make you trip as you walk through a door, such as a raised step or threshold. Trim any bushes or trees on the path to your home. Use bright outdoor lighting. Clear any walking paths of anything that might make someone trip, such as rocks or tools. Regularly check to see if handrails are loose or broken. Make sure that both sides of any steps have  handrails. Any raised decks and porches should have guardrails on the edges. Have any leaves, snow, or ice cleared regularly. Use sand or salt on walking paths during winter. Clean up any spills in your garage right away. This includes oil or grease spills. What can I do in the bathroom? Use night lights. Install grab bars by the toilet and in the tub and shower. Do not use towel bars as grab bars. Use non-skid mats or decals in the tub or shower. If you need to sit down in the shower, use a plastic, non-slip stool. Keep the floor dry. Clean up any water that spills on the floor as soon as it happens. Remove soap buildup in the tub or shower regularly. Attach bath mats securely with double-sided non-slip rug tape. Do not have throw rugs and other things on the floor that can make you trip. What can I do in the bedroom? Use night lights. Make sure that you have a light by your bed that is easy to reach. Do not use any sheets or blankets that are too big for your bed. They should not hang down onto the floor. Have a firm chair that has side arms. You can use this for support while you get dressed. Do not have throw rugs and other things on the floor that can make you trip. What can I do in the kitchen? Clean up any spills right away. Avoid walking on wet floors. Keep items that you  use a lot in easy-to-reach places. If you need to reach something above you, use a strong step stool that has a grab bar. Keep electrical cords out of the way. Do not use floor polish or wax that makes floors slippery. If you must use wax, use non-skid floor wax. Do not have throw rugs and other things on the floor that can make you trip. What can I do with my stairs? Do not leave any items on the stairs. Make sure that there are handrails on both sides of the stairs and use them. Fix handrails that are broken or loose. Make sure that handrails are as long as the stairways. Check any carpeting to make sure  that it is firmly attached to the stairs. Fix any carpet that is loose or worn. Avoid having throw rugs at the top or bottom of the stairs. If you do have throw rugs, attach them to the floor with carpet tape. Make sure that you have a light switch at the top of the stairs and the bottom of the stairs. If you do not have them, ask someone to add them for you. What else can I do to help prevent falls? Wear shoes that: Do not have high heels. Have rubber bottoms. Are comfortable and fit you well. Are closed at the toe. Do not wear sandals. If you use a stepladder: Make sure that it is fully opened. Do not climb a closed stepladder. Make sure that both sides of the stepladder are locked into place. Ask someone to hold it for you, if possible. Clearly mark and make sure that you can see: Any grab bars or handrails. First and last steps. Where the edge of each step is. Use tools that help you move around (mobility aids) if they are needed. These include: Canes. Walkers. Scooters. Crutches. Turn on the lights when you go into a dark area. Replace any light bulbs as soon as they burn out. Set up your furniture so you have a clear path. Avoid moving your furniture around. If any of your floors are uneven, fix them. If there are any pets around you, be aware of where they are. Review your medicines with your doctor. Some medicines can make you feel dizzy. This can increase your chance of falling. Ask your doctor what other things that you can do to help prevent falls. This information is not intended to replace advice given to you by your health care provider. Make sure you discuss any questions you have with your health care provider. Document Released: 10/07/2009 Document Revised: 05/18/2016 Document Reviewed: 01/15/2015 Elsevier Interactive Patient Education  2017 [employer].

## 2021-07-11 ENCOUNTER — Telehealth: Payer: Self-pay | Admitting: Family Medicine

## 2021-07-11 NOTE — Telephone Encounter (Signed)
   Telephone encounter was:  Successful.  07/11/2021 Name: April Luna MRN: [id number] DOB: Feb 08, 1971  April Luna is a 50 y.o. year old female who is a primary care patient of Steele Sizer, MD . The community resource team was consulted for assistance with Transportation Needs   Care guide performed the following interventions: Discussed resources to assist with transportation with [name]. Phone number on file was cutting in and out. Called on [phone] and it was a better connection. Gave pt phone number to [employer] to call and set up transportation as needed .  Follow Up Plan:  No further follow up planned at this time. The patient has been provided with needed resources.  April Green Care Guide, Embedded Care Coordination [place], Care Management Phone: [phone] Email: [email or web address]

## 2021-07-12 ENCOUNTER — Other Ambulatory Visit: Payer: Self-pay | Admitting: Family Medicine

## 2021-07-12 ENCOUNTER — Telehealth: Payer: Self-pay | Admitting: *Deleted

## 2021-07-12 DIAGNOSIS — E1129 Type 2 diabetes mellitus with other diabetic kidney complication: Secondary | ICD-10-CM

## 2021-07-12 DIAGNOSIS — R809 Proteinuria, unspecified: Secondary | ICD-10-CM

## 2021-07-12 DIAGNOSIS — K219 Gastro-esophageal reflux disease without esophagitis: Secondary | ICD-10-CM

## 2021-07-12 DIAGNOSIS — I10 Essential (primary) hypertension: Secondary | ICD-10-CM

## 2021-07-12 NOTE — Chronic Care Management (AMB) (Signed)
  Chronic Care Management   Note  07/12/2021 Name: April Luna MRN: [id number] DOB: 19-Apr-1971  April Luna is a 50 y.o. year old female who is a primary care patient of Steele Sizer, MD. I reached out to [employer] by phone today in response to a referral sent by [name] Monday's PCP, Dr. Ancil Boozer      [name] was given information about Chronic Care Management services today including:  CCM service includes personalized support from designated clinical staff supervised by her physician, including individualized plan of care and coordination with other care providers 24/7 contact phone numbers for assistance for urgent and routine care needs. Service will only be billed when office clinical staff spend 20 minutes or more in a month to coordinate care. Only one practitioner may furnish and bill the service in a calendar month. The patient may stop CCM services at any time (effective at the end of the month) by phone call to the office staff. The patient will be responsible for cost sharing (co-pay) of up to 20% of the service fee (after annual deductible is met).  Patient agreed to services and verbal consent obtained.   Follow up plan: Telephone appointment with care management team member scheduled for:07/21/21  [facility] Management

## 2021-07-18 ENCOUNTER — Telehealth: Payer: Self-pay

## 2021-07-18 NOTE — Progress Notes (Signed)
Chronic Care Management Pharmacy Assistant   Name: LINELL MELDRUM  MRN: [id number] DOB: 02/06/1971  Chart Prep for Initial Appointment with Junius Argyle, CPP on 07/21/2021 @ 1100 via telephone.   Conditions to be addressed/monitored: HTN, COPD, DMII, Bipolar Disorder, and GERD w/o Esophagitis, Hypothyroidism, Hyperlipidemia, Morbid Obesity  Primary concerns for visit include: Patient states that he main concerns right now are her breathing and her digestive system. She states that the new inhaler she has is okay, but it's not controlling her SOB like she would like it to. She does see pulmonary about this, but patient feels she might benefit from a nebulizer as well.  Patient also reports that with her digestive she does sometime have nausea, vomiting, diarrhea, and constipation. Right now she is symptom free, and she has started taking Miralax for her constipation which is helping her some and she does see Gastro as well.  Recent office visits:  06/20/2021 Steele Sizer, MD (PCP Office Visit) for Medication Refill- No medication changes noted; Labs ordered  02/14/2021 Steele Sizer, MD (PCP Office Visit) for Well Adult Exam- No Medication changes noted; Cytology PAP order placed; MM 3D Screen Breast Bilateral Placed  01/19/2021 Steele Sizer, MD (PCP Office Visit) for Type II DM- No medication changes noted; Referral to Gastroenterology  Recent consult visits:  05/30/2021 Lin Landsman, MD (Gastroenterology) for follow-up- Discontinued: Na Sulfate-K Sulfate-Mg Sulf 17.5-3.13-1.6 GM/177ML SOLN; patient provided packets of Miralax  03/21/2021 Rohini Raeanne Gathers, MD (Gastroenterology) for NP initial visit for abdominal pain- Patient instructed to hold Amitiza for now; Started: Na Sulfate-K Sulfate-Mg Sulf 17.5-3.13-1.6 GM/177ML SOLN; labs ordered; Patient instructed to follow-up in 2-3 months  03/08/2021 Vernard Gambles, MD (Pulmonary) Telephone Call- Patient called to advise  the [facility] worked provider Discontinued Trelegy.   02/24/2021 Vernard Gambles, MD (Pulmonary) for Follow-up- Started Breztri 2 puffs bid instructed not to take Trelegy while using Breztri; Home sleep test ordered; Pulmonary Function Test [facility] only ordered; patient instructed to follow-up in 3 months  Hospital visits:  None in previous 6 months  Medications: Outpatient Encounter Medications as of 07/18/2021  Medication Sig Note   albuterol (PROVENTIL) (2.5 MG/3ML) 0.083% nebulizer solution Take 3 mLs (2.5 mg total) by nebulization every 6 (six) hours as needed for wheezing or shortness of breath.    amantadine (SYMMETREL) 100 MG capsule Take 100 mg by mouth 2 (two) times daily.    blood glucose meter kit and supplies Dispense based on patient and insurance preference. Use up to four times daily as directed. (FOR ICD-10 E10.9, E11.9). (Patient not taking: Reported on 07/07/2021)    Budeson-Glycopyrrol-Formoterol (BREZTRI AEROSPHERE) 160-9-4.8 MCG/ACT AERO Inhale 2 puffs into the lungs in the morning and at bedtime.    buPROPion (WELLBUTRIN XL) 150 MG 24 hr tablet Take 150 mg by mouth daily.    Cholecalciferol (VITAMIN D-3) 125 MCG (5000 UT) TABS Take 5,000 Units by mouth daily.    gabapentin (NEURONTIN) 300 MG capsule Take 300 mg by mouth 2 (two) times daily.    hydrOXYzine (VISTARIL) 50 MG capsule Take 50 mg by mouth 2 (two) times daily as needed for anxiety.    icosapent Ethyl (VASCEPA) 1 g capsule TAKE 2 CAPSULES BY MOUTH TWICE A DAY    INVEGA SUSTENNA 234 MG/1.5ML SUSY injection Inject into the muscle. Inject as directed once a month    levothyroxine (SYNTHROID) 75 MCG tablet TAKE 1 TABLET BY MOUTH DAILY AT 6 AM    loratadine (CLARITIN) 10 MG tablet  Take 10 mg by mouth daily.    losartan (COZAAR) 50 MG tablet TAKE 1 TABLET BY MOUTH DAILY    malathion (OVIDE) 0.5 % lotion Apply topically.    nicotine (NICOTROL) 10 MG inhaler Inhale 1 Cartridge (1 continuous puffing total) into the lungs as  needed for smoking cessation. (Patient not taking: Reported on 07/07/2021) 07/07/2021: Pt plans to start using to help quit smoking    pantoprazole (PROTONIX) 40 MG tablet TAKE 1 TABLET BY MOUTH DAILY    rosuvastatin (CRESTOR) 40 MG tablet TAKE 1 TABLET BY MOUTH DAILY    sertraline (ZOLOFT) 100 MG tablet Take 200 mg by mouth daily.    No facility-administered encounter medications on file as of 07/18/2021.   Care Gaps: OPHTHALMOLOGY EXAM COLONOSCOPY Pneumococcal Vaccine MAMMOGRAM  Star Rating Drugs: Losartan 50 mg last filled on 07/12/2021 for a 30- Day supply with [facility] Rosuvastatin 40 mg last filled on 07/04/2021 for a 30-Day supply with [facility]  Have you seen any other providers since your last visit? **no  Any changes in your medications or health? She states her health is getting better and worse at the same time. She reports that her breathing is not good at the moment. The new inhaler she has is not working as well, and she feels like maybe she would benefit from a nebulizer machine.  Any side effects from any medications? Patient reports that she is hyper and she paces all day and she is not sure if her medications are causing this.  Do you have an symptoms or problems not managed by your medications? yes  Any concerns about your health right now? yes  Has your provider asked that you check blood pressure, blood sugar, or follow special diet at home? no  Do you get any type of exercise on a regular basis? Patient states that she does have mobility issues and she is scared to exercise alone as she does have breathing issues.   Can you think of a goal you would like to reach for your health? She stated she would like to start walking every day, and going to the pool every so often.  Do you have any problems getting your medications? no  Please bring medications and supplements to appointment  Lynann Bologna, [place] Pharmacist  Assistant Phone: [phone]

## 2021-07-21 ENCOUNTER — Telehealth: Payer: Medicare Other

## 2021-07-21 NOTE — Progress Notes (Deleted)
Chronic Care Management Pharmacy Note  07/21/2021 Name:  April Luna MRN:  [id number] DOB:  03/24/1971  Summary: ***  Recommendations/Changes made from today's visit: ***  Plan: ***   Subjective: April Luna is an 50 y.o. year old female who is a primary patient of Steele Sizer, MD.  The CCM team was consulted for assistance with disease management and care coordination needs.    Engaged with patient by telephone for initial visit in response to provider referral for pharmacy case management and/or care coordination services.   Consent to Services:  The patient was given the following information about Chronic Care Management services today, agreed to services, and gave verbal consent: 1. CCM service includes personalized support from designated clinical staff supervised by the primary care provider, including individualized plan of care and coordination with other care providers 2. 24/7 contact phone numbers for assistance for urgent and routine care needs. 3. Service will only be billed when office clinical staff spend 20 minutes or more in a month to coordinate care. 4. Only one practitioner may furnish and bill the service in a calendar month. 5.The patient may stop CCM services at any time (effective at the end of the month) by phone call to the office staff. 6. The patient will be responsible for cost sharing (co-pay) of up to 20% of the service fee (after annual deductible is met). Patient agreed to services and consent obtained.  Patient Care Team: Steele Sizer, MD as PCP - General (Family Medicine) Sharmaine Base, MD as Referring Physician (Psychiatry) Bjorn Loser, MD as Consulting Physician (Urology) Germaine Pomfret, [facility] (Pharmacist)  Recent office visits: 06/20/2021 Steele Sizer, MD (PCP Office Visit) for Medication Refill- No medication changes noted; Labs ordered   02/14/2021 Steele Sizer, MD (PCP Office Visit) for Well Adult Exam-  No Medication changes noted; Cytology PAP order placed; MM 3D Screen Breast Bilateral Placed   01/19/2021 Steele Sizer, MD (PCP Office Visit) for Type II DM- No medication changes noted; Referral to Gastroenterology  Recent consult visits: 05/30/2021 Lin Landsman, MD (Gastroenterology) for follow-up- Discontinued: Na Sulfate-K Sulfate-Mg Sulf 17.5-3.13-1.6 GM/177ML SOLN; patient provided packets of Miralax   03/21/2021 Rohini Raeanne Gathers, MD (Gastroenterology) for NP initial visit for abdominal pain- Patient instructed to hold Amitiza for now; Started: Na Sulfate-K Sulfate-Mg Sulf 17.5-3.13-1.6 GM/177ML SOLN; labs ordered; Patient instructed to follow-up in 2-3 months   03/08/2021 Vernard Gambles, MD (Pulmonary) Telephone Call- Patient called to advise the [facility] worked provider Discontinued Trelegy.   02/24/2021 Vernard Gambles, MD (Pulmonary) for Follow-up- Started Breztri 2 puffs bid instructed not to take Trelegy while using Breztri; Home sleep test ordered; Pulmonary Function Test [facility] only ordered; patient instructed to follow-up in 3 months  Hospital visits: None in previous 6 months   Objective:  Lab Results  Component Value Date   CREATININE 0.97 06/20/2021   BUN 10 06/20/2021   GFRNONAA 69 06/20/2021   GFRAA 80 06/20/2021   NA 139 06/20/2021   K 3.8 06/20/2021   CALCIUM 10.7 (H) 06/20/2021   CO2 31 06/20/2021   GLUCOSE 91 06/20/2021    Lab Results  Component Value Date/Time   HGBA1C 6.1 (A) 06/20/2021 11:53 AM   HGBA1C 6.2 (A) 11/03/2020 02:55 PM   HGBA1C 5.9 (H) 04/21/2020 12:22 PM   HGBA1C 6.0 (H) 06/24/2019 09:35 AM   HGBA1C 6.5 01/02/2019 10:20 AM   HGBA1C 5.5 01/08/2015 06:55 AM   HGBA1C 4.9 07/23/2014 05:25 AM   MICROALBUR 22.2 06/20/2021  12:34 PM   MICROALBUR 4.8 04/21/2020 11:20 AM    Last diabetic Eye exam: No results found for: HMDIABEYEEXA  Last diabetic Foot exam: No results found for: HMDIABFOOTEX   Lab Results  Component Value Date    CHOL 231 (H) 11/03/2020   HDL 42 (L) 11/03/2020   LDLCALC 155 (H) 11/03/2020   TRIG 205 (H) 11/03/2020   CHOLHDL 5.5 (H) 11/03/2020    Hepatic Function Latest Ref Rng & Units 06/20/2021 11/03/2020 04/21/2020  Total Protein 6.1 - 8.1 g/dL 6.9 6.9 7.1  Albumin 3.5 - 5.0 g/dL - - -  AST 10 - 35 U/L '14 14 12  ' ALT 6 - 29 U/L '12 10 7  ' Alk Phosphatase 38 - 126 U/L - - -  Total Bilirubin 0.2 - 1.2 mg/dL 0.5 0.4 0.4  Bilirubin, Direct 0.0 - 0.2 mg/dL - - -    Lab Results  Component Value Date/Time   TSH 2.28 06/20/2021 12:34 PM   TSH 2.17 04/21/2020 12:22 PM    CBC Latest Ref Rng & Units 06/20/2021 04/21/2020 02/04/2020  WBC 3.8 - 10.8 Thousand/uL 11.2(H) 8.8 10.0  Hemoglobin 11.7 - 15.5 g/dL 15.8(H) 15.0 13.6  Hematocrit 35.0 - 45.0 % 47.3(H) 46.0(H) 39.9  Platelets 140 - 400 Thousand/uL 192 151 159    No results found for: VD25OH  Clinical ASCVD: {YES/NO:21197} The 10-year ASCVD risk score Mikey Bussing DC Jr., et al., 2013) is: 15.8%   Values used to calculate the score:     Age: 24 years     Sex: Female     Is Non-Hispanic African American: No     Diabetic: Yes     Tobacco smoker: Yes     Systolic Blood Pressure: 347 mmHg     Is BP treated: Yes     HDL Cholesterol: 42 mg/dL     Total Cholesterol: 231 mg/dL    Depression screen [facility] 2/9 07/07/2021 06/20/2021 02/14/2021  Decreased Interest 0 2 0  Down, Depressed, Hopeless 0 0 1  PHQ - 2 Score 0 2 1  Altered sleeping 0 1 1  Tired, decreased energy 0 3 1  Change in appetite 0 3 1  Feeling bad or failure about yourself  0 0 0  Trouble concentrating 3 0 2  Moving slowly or fidgety/restless 3 0 0  Suicidal thoughts 0 0 0  PHQ-9 Score '6 9 6  ' Difficult doing work/chores - - -  Some recent data might be hidden     ***Other: (CHADS2VASc if Afib, MMRC or CAT for COPD, ACT, DEXA)  Social History   Tobacco Use  Smoking Status Every Day   Packs/day: 1.00   Years: 34.00   Pack years: 34.00   Types: Cigarettes   Start date: 05/13/1985   Smokeless Tobacco Never  Tobacco Comments   1 PPD 02/24/2021   BP Readings from Last 3 Encounters:  06/20/21 136/84  05/30/21 (!) 137/96  03/21/21 127/81   Pulse Readings from Last 3 Encounters:  06/20/21 82  05/30/21 (!) 108  03/21/21 92   Wt Readings from Last 3 Encounters:  06/20/21 153 lb (69.4 kg)  05/30/21 155 lb (70.3 kg)  03/21/21 158 lb 4 oz (71.8 kg)   BMI Readings from Last 3 Encounters:  06/20/21 30.90 kg/m  05/30/21 31.31 kg/m  03/21/21 31.96 kg/m    Assessment/Interventions: Review of patient past medical history, allergies, medications, health status, including review of consultants reports, laboratory and other test data, was performed as part of comprehensive  evaluation and provision of chronic care management services.   SDOH:  (Social Determinants of Health) assessments and interventions performed: {yes/no:20286}  SDOH Screenings   Alcohol Screen: Low Risk    Last Alcohol Screening Score (AUDIT): 3  Depression (PHQ2-9): Medium Risk   PHQ-2 Score: 6  Financial Resource Strain: Low Risk    Difficulty of Paying Living Expenses: Not hard at all  Food Insecurity: No Food Insecurity   Worried About Charity fundraiser in the Last Year: Never true   Ran Out of Food in the Last Year: Never true  Housing: Low Risk    Last Housing Risk Score: 0  Physical Activity: Inactive   Days of Exercise per Week: 0 days   Minutes of Exercise per Session: 0 min  Social Connections: Moderately Isolated   Frequency of Communication with Friends and Family: More than three times a week   Frequency of Social Gatherings with Friends and Family: Twice a week   Attends Religious Services: Never   Marine scientist or Organizations: No   Attends Archivist Meetings: Never   Marital Status: Married  Stress: Stress Concern Present   Feeling of Stress : To some extent  Tobacco Use: High Risk   Smoking Tobacco Use: Every Day   Smokeless Tobacco Use: Never   Transportation Needs: Unmet Transportation Needs   Lack of Transportation (Medical): Yes   Lack of Transportation (Non-Medical): Yes    CCM Care Plan  Allergies  Allergen Reactions   Metformin And Related Nausea And Vomiting   Penicillins Other (See Comments)    She has taken amoxicillin without problems   Perphenazine Other (See Comments)    Tremors, muscle weakness, tongue swelling   Sulfa Antibiotics Other (See Comments)    GI distress    Abilify [Aripiprazole] Rash    Medications Reviewed Today     Reviewed by Clemetine Marker, LPN (Licensed Practical Nurse) on 07/07/21 at 1054  Med List Status: <None>   Medication Order Taking? Sig Documenting Provider Last Dose Status Informant  albuterol (PROVENTIL) (2.5 MG/3ML) 0.083% nebulizer solution [id number] Yes Take 3 mLs (2.5 mg total) by nebulization every 6 (six) hours as needed for wheezing or shortness of breath. Fredderick Severance, NP Taking Active   amantadine (SYMMETREL) 100 MG capsule [id number] Yes Take 100 mg by mouth 2 (two) times daily. Sharmaine Base, MD Taking Active Self  blood glucose meter kit and supplies [id number] No Dispense based on patient and insurance preference. Use up to four times daily as directed. (FOR ICD-10 E10.9, E11.9).  Patient not taking: Reported on 07/07/2021   Hubbard Hartshorn, FNP Not Taking Active   Budeson-Glycopyrrol-Formoterol [facility] AEROSPHERE) 160-9-4.8 MCG/ACT Hollie Salk [id number] Yes Inhale 2 puffs into the lungs in the morning and at bedtime. Tyler Pita, MD Taking Active   buPROPion (WELLBUTRIN XL) 150 MG 24 hr tablet [id number] Yes Take 150 mg by mouth daily. Sharmaine Base, MD Taking Active Self  Cholecalciferol (VITAMIN D-3) 125 MCG (5000 UT) TABS [id number] Yes Take 5,000 Units by mouth daily. Steele Sizer, MD Taking Active   gabapentin (NEURONTIN) 300 MG capsule [id number] Yes Take 300 mg by mouth 2 (two) times daily. Sharmaine Base, MD Taking Active Self  hydrOXYzine  (VISTARIL) 50 MG capsule [id number] Yes Take 50 mg by mouth 2 (two) times daily as needed for anxiety. Sharmaine Base, MD Taking Active Self  icosapent Ethyl (VASCEPA) 1 g capsule [id number] Yes TAKE 2 CAPSULES BY MOUTH TWICE A DAY Sowles,  Drue Stager, MD Taking Active   INVEGA SUSTENNA 234 MG/1.5ML SUSY injection [id number] Yes Inject into the muscle. Inject as directed once a month [provider] Taking Active   levothyroxine (SYNTHROID) 75 MCG tablet [id number] Yes TAKE 1 TABLET BY MOUTH DAILY AT 6 AM Sowles, Drue Stager, MD Taking Active   losartan (COZAAR) 50 MG tablet [id number] Yes Take 1 tablet (50 mg total) by mouth daily. Steele Sizer, MD Taking Active   malathion (OVIDE) 0.5 % lotion [id number] Yes Apply topically. [provider] Taking Active   nicotine (NICOTROL) 10 MG inhaler [id number] No Inhale 1 Cartridge (1 continuous puffing total) into the lungs as needed for smoking cessation.  Patient not taking: Reported on 07/07/2021   Tyler Pita, MD Not Taking Active            Med Note Ina Homes Jul 07, 2021 10:53 AM) Pt plans to start using to help quit smoking   pantoprazole (PROTONIX) 40 MG tablet [id number] Yes Take 1 tablet (40 mg total) by mouth daily. Steele Sizer, MD Taking Active   rosuvastatin (CRESTOR) 40 MG tablet [id number] Yes TAKE 1 TABLET BY MOUTH DAILY Sowles, Drue Stager, MD Taking Active   sertraline (ZOLOFT) 100 MG tablet [id number] Yes Take 200 mg by mouth daily. Sharmaine Base, MD Taking Active Self            Patient Active Problem List   Diagnosis Date Noted   Plantar callus 10/06/2019   Acquired hallux limitus of both feet 10/06/2019   Morbid obesity ([place]) 06/24/2019   Type 2 diabetes mellitus with microalbuminuria, without long-term current use of insulin ([place]) 06/24/2019   Chronic obstructive pulmonary disease ([place]) 06/24/2019   Chronic constipation 06/24/2019   ASCUS of cervix with negative high risk HPV 09/05/2018   GERD  without esophagitis 04/11/2018   Hyperlipidemia 04/11/2018   Hypertension 04/11/2018   Hypothyroidism 04/26/2015   Bipolar I disorder, most recent episode (or current) manic ([place]) 04/25/2015   Delirium due to another medical condition 04/25/2015   Cannabis abuse 04/25/2015    Immunization History  Administered Date(s) Administered   Influenza,inj,Quad PF,6+ Mos 09/02/2018, 09/24/2019, 11/03/2020   PPD Test 05/10/2010   Pneumococcal Polysaccharide-23 05/14/2018   Tdap 06/30/2015   Zoster Recombinat (Shingrix) 02/14/2021    Conditions to be addressed/monitored:  Hypertension, Hyperlipidemia, Diabetes, GERD, COPD, Hypothyroidism, and Bipolar 1 Disorder  There are no care plans that you recently modified to display for this patient.    Medication Assistance: {MEDASSISTANCEINFO:25044}  Compliance/Adherence/Medication fill history: Care Gaps: ***  Star-Rating Drugs: ***  Patient's preferred pharmacy is:  [facility] [place], [place] - [address] [place] [address] Phone: [phone] Fax: [phone]  [id number] [address]Phone: [phone]: [phone] Uses pill box? {Yes or If no, why not?:20788} Pt endorses ***% compliance  We discussed: {Pharmacy options:24294} Patient decided to: {US Pharmacy Plan:23885}  Care Plan and Follow Up Patient Decision:  {FOLLOWUP:24991}  Plan: {CM FOLLOW UP PFVCB:44967} ***  Current Barriers:  {pharmacybarriers:24917}  Pharmacist Clinical Goal(s):  Patient will {PHARMACYGOALCHOICES:24921} through collaboration with PharmD and provider.   Interventions: 1:1 collaboration with SSteele Sizer MD regarding development and update of comprehensive plan of care as evidenced by provider attestation and co-signature Inter-disciplinary care team collaboration (see longitudinal plan of care) Comprehensive medication  review performed; medication list updated in electronic medical record  Hypertension  (  Status:{CMCPGOALSTATUS:25802})   Med Management Intervention: {PHARMMEDMGMNT:25878}  (BP goal {CHL HP UPSTREAM Pharmacist BP ranges:[phone]}) -{US controlled/uncontrolled:25276} -Current treatment: Losartan 50 mg daily  -Medications previously tried: ***  -Current home readings: *** -Current dietary habits: *** -Current exercise habits: *** -{ACTIONS;DENIES/REPORTS:21021675} hypotensive/hypertensive symptoms -Educated on {CCM BP Counseling:25124} -Counseled to monitor BP at home ***, document, and provide log at future appointments -{CCMPHARMDINTERVENTION:25122}  Hyperlipidemia: (LDL goal < ***) -{US controlled/uncontrolled:25276} -Current treatment: Vascepa 1g 2 caps twice daily  Rosuvastatin 40 mg daily -Medications previously tried: ***  -Current dietary patterns: *** -Current exercise habits: *** -Educated on {CCM HLD Counseling:25126} -{CCMPHARMDINTERVENTION:25122}  Diabetes (A1c goal {A1c goals:23924}) -{US controlled/uncontrolled:25276} -Current medications: *** -Medications previously tried: ***  -Current home glucose readings fasting glucose: *** post prandial glucose: *** -{ACTIONS;DENIES/REPORTS:21021675} hypoglycemic/hyperglycemic symptoms -Current meal patterns:  breakfast: ***  lunch: ***  dinner: *** snacks: *** drinks: *** -Current exercise: *** -Educated on {CCM DM COUNSELING:25123} -Counseled to check feet daily and get yearly eye exams -{CCMPHARMDINTERVENTION:25122}  COPD (Goal: control symptoms and prevent exacerbations) -{US controlled/uncontrolled:25276} -Current treatment  Albuterol nebulizer 3 mL every 6 hours as needed Breztri 2 puffs twice daily  -Medications previously tried: ***  -Gold Grade: {CHL HP Upstream Pharm COPD Gold TKPTW:[id number]} -Current COPD Classification:  {CHL HP Upstream Pharm COPD Classification:[phone]} -MMRC/CAT score:  *** -Pulmonary function testing: *** -Exacerbations requiring treatment in last 6 months: *** -Patient {Actions; denies-reports:120008} consistent use of maintenance inhaler -Frequency of rescue inhaler use: *** -Counseled on {CCMINHALERCOUNSELING:25121} -{CCMPHARMDINTERVENTION:25122}  Tobacco use (Goal ***) -{US controlled/uncontrolled:25276} -Previous quit attempts: *** -Current treatment  *** -Patient smokes {Time to first cigarette:23873} -Patient triggers include: {Smoking Triggers:23882} -On a scale of 1-10, reports MOTIVATION to quit is *** -On a scale of 1-10, reports CONFIDENCE in quitting is *** -{Smoking Cessation Counseling:23883} -{CCMPHARMDINTERVENTION:25122}   Bipolar 1 Disorder (Goal: ***) -{US controlled/uncontrolled:25276} -Current treatment: Bupropion XL 150 mg daily  Hydroxyzine 50 mg twice daily as needed  Invega 234 mg into the muscle monthly  Sertraline 100 mg 2 tablets daily  -Medications previously tried/failed: *** -PHQ9: *** -GAD7: *** -Connected with *** for mental health support -Educated on {CCM mental health counseling:25127} -{CCMPHARMDINTERVENTION:25122}  *** (Goal: ***) -{US controlled/uncontrolled:25276} -Current treatment  Levothyroxine 75 mcg daily  -Medications previously tried: ***  -{CCMPHARMDINTERVENTION:25122}  *** (Goal: ***) -{US controlled/uncontrolled:25276} -Current treatment  Pantoprazole 40 mg daily  -Medications previously tried: ***  -{CCMPHARMDINTERVENTION:25122}  Chronic Kidney Disease Stage 2  -All medications assessed for renal dosing and appropriateness in chronic kidney disease. -{CCMPHARMDINTERVENTION:25122}   Patient Goals/Self-Care Activities Patient will:  - {pharmacypatientgoals:24919}  Follow Up Plan: {CM FOLLOW UP GYFV:49449}

## 2021-07-29 ENCOUNTER — Emergency Department
Admission: EM | Admit: 2021-07-29 | Discharge: 2021-07-29 | Disposition: A | Discharge status: Home / Self Care | Payer: Medicare Other | Attending: Emergency Medicine | Admitting: Emergency Medicine

## 2021-07-29 ENCOUNTER — Encounter: Payer: Self-pay | Admitting: Emergency Medicine

## 2021-07-29 ENCOUNTER — Emergency Department: Payer: Medicare Other

## 2021-07-29 ENCOUNTER — Other Ambulatory Visit: Payer: Self-pay

## 2021-07-29 DIAGNOSIS — J029 Acute pharyngitis, unspecified: Secondary | ICD-10-CM | POA: Diagnosis not present

## 2021-07-29 DIAGNOSIS — N189 Chronic kidney disease, unspecified: Secondary | ICD-10-CM | POA: Diagnosis not present

## 2021-07-29 DIAGNOSIS — Z79899 Other long term (current) drug therapy: Secondary | ICD-10-CM | POA: Insufficient documentation

## 2021-07-29 DIAGNOSIS — R059 Cough, unspecified: Secondary | ICD-10-CM | POA: Diagnosis not present

## 2021-07-29 DIAGNOSIS — I129 Hypertensive chronic kidney disease with stage 1 through stage 4 chronic kidney disease, or unspecified chronic kidney disease: Secondary | ICD-10-CM | POA: Diagnosis not present

## 2021-07-29 DIAGNOSIS — F17210 Nicotine dependence, cigarettes, uncomplicated: Secondary | ICD-10-CM | POA: Insufficient documentation

## 2021-07-29 DIAGNOSIS — J449 Chronic obstructive pulmonary disease, unspecified: Secondary | ICD-10-CM | POA: Insufficient documentation

## 2021-07-29 DIAGNOSIS — H66003 Acute suppurative otitis media without spontaneous rupture of ear drum, bilateral: Secondary | ICD-10-CM | POA: Insufficient documentation

## 2021-07-29 DIAGNOSIS — E1122 Type 2 diabetes mellitus with diabetic chronic kidney disease: Secondary | ICD-10-CM | POA: Insufficient documentation

## 2021-07-29 DIAGNOSIS — E039 Hypothyroidism, unspecified: Secondary | ICD-10-CM | POA: Insufficient documentation

## 2021-07-29 DIAGNOSIS — Z7951 Long term (current) use of inhaled steroids: Secondary | ICD-10-CM | POA: Insufficient documentation

## 2021-07-29 DIAGNOSIS — R0602 Shortness of breath: Secondary | ICD-10-CM | POA: Diagnosis not present

## 2021-07-29 DIAGNOSIS — H9203 Otalgia, bilateral: Secondary | ICD-10-CM | POA: Diagnosis present

## 2021-07-29 DIAGNOSIS — R0781 Pleurodynia: Secondary | ICD-10-CM | POA: Diagnosis not present

## 2021-07-29 LAB — GROUP A STREP BY PCR: Group A Strep by PCR: NOT DETECTED

## 2021-07-29 MED ORDER — AMOXICILLIN-POT CLAVULANATE 875-125 MG PO TABS
1.0000 | ORAL_TABLET | Freq: Once | ORAL | Status: AC
  Administered 2021-07-29: 1 via ORAL
  Filled 2021-07-29: qty 1

## 2021-07-29 MED ORDER — ACETAMINOPHEN 500 MG PO TABS
1000.0000 mg | ORAL_TABLET | Freq: Once | ORAL | Status: AC
  Administered 2021-07-29: 1000 mg via ORAL
  Filled 2021-07-29: qty 2

## 2021-07-29 MED ORDER — AMOXICILLIN-POT CLAVULANATE 875-125 MG PO TABS: 1.0000 | ORAL_TABLET | Freq: Two times a day (BID) | ORAL | 0 refills | Status: AC

## 2021-07-29 NOTE — ED Provider Notes (Signed)
[facility] EMERGENCY DEPARTMENT Provider Note   CSN: [id number] Arrival date & time: 07/29/21  1324     History Chief Complaint  Patient presents with   Otalgia   Sore Throat   Chest Pain    April Luna is a 50 y.o. female presents to the emergency department for evaluation of bilateral ear pain, sore throat.  Symptoms have been present x3 days.  She also complains of several days of a sore throat.  She denies any chest pain, shortness of breath.  No difficulty swallowing.  No nausea vomiting or diarrhea.  Tolerating p.o. well.  Bilateral ear pain is moderate to severe with no drainage.  She denies any sinus pain or pressure but has had some congestion.  No cough.  Patient has a history of COPD, breathing at baseline.  HPI     Past Medical History:  Diagnosis Date   Allergy    pollen   Anxiety    Bipolar 1 disorder ([place])    Chronic kidney disease    COPD (chronic obstructive pulmonary disease) ([place])    Depression    Family history of adverse reaction to anesthesia    grand father had a stroke during anesthesia   GERD (gastroesophageal reflux disease)    History of kidney stones    Hyperlipidemia    Hypertension    Hypothyroidism    Pneumonia    Type 2 diabetes mellitus with microalbuminuria, without long-term current use of insulin ([place]) 06/24/2019    Patient Active Problem List   Diagnosis Date Noted   Plantar callus 10/06/2019   Acquired hallux limitus of both feet 10/06/2019   Morbid obesity ([place]) 06/24/2019   Type 2 diabetes mellitus with microalbuminuria, without long-term current use of insulin ([place]) 06/24/2019   Chronic obstructive pulmonary disease ([place]) 06/24/2019   Chronic constipation 06/24/2019   ASCUS of cervix with negative high risk HPV 09/05/2018   GERD without esophagitis 04/11/2018   Hyperlipidemia 04/11/2018   Hypertension 04/11/2018   Hypothyroidism 04/26/2015   Bipolar I disorder, most recent episode (or  current) manic ([place]) 04/25/2015   Delirium due to another medical condition 04/25/2015   Cannabis abuse 04/25/2015    Past Surgical History:  Procedure Laterality Date   CESAREAN SECTION     CYSTOSCOPY W/ RETROGRADES Left 11/08/2018   Procedure: CYSTOSCOPY WITH RETROGRADE PYELOGRAM;  Surgeon: Billey Co, MD;  Location: [facility] ORS;  Service: Urology;  Laterality: Left;   CYSTOSCOPY/URETEROSCOPY/HOLMIUM LASER/STENT PLACEMENT Left 11/08/2018   Procedure: CYSTOSCOPY/URETEROSCOPY/HOLMIUM LASER/STENT PLACEMENT;  Surgeon: Billey Co, MD;  Location: [facility] ORS;  Service: Urology;  Laterality: Left;   MOUTH SURGERY     wisdom teeth extraction     OB History   No obstetric history on file.     Family History  Problem Relation Age of Onset   Depression Mother    Anxiety disorder Mother    Diabetes Mother    Hypertension Mother    Hyperlipidemia Mother    Cancer Mother    Cancer Father        colon cancer   Depression Brother    Anxiety disorder Brother    Diabetes Mellitus II Maternal Grandmother    Hypercholesterolemia Maternal Grandmother    Cancer Paternal Grandmother    Diabetes Paternal Grandmother     Social History   Tobacco Use   Smoking status: Every Day    Packs/day: 1.00    Years: 34.00    Pack years: 34.00  Types: Cigarettes    Start date: 05/13/1985   Smokeless tobacco: Never   Tobacco comments:    1 PPD 02/24/2021  Vaping Use   Vaping Use: Former   Start date: 05/25/2018   Quit date: 09/24/2018  Substance Use Topics   Alcohol use: Not Currently    Alcohol/week: 4.0 standard drinks    Types: 4 Glasses of wine per week    Comment: occasional   Drug use: Not Currently    Types: Marijuana    Comment: none since August 2019    Home Medications Prior to Admission medications   Medication Sig Start Date End Date Taking? Authorizing Provider  amoxicillin-clavulanate (AUGMENTIN) 875-125 MG tablet Take 1 tablet by mouth every 12 (twelve) hours for 10  days. 07/29/21 08/08/21 Yes Duanne Guess, PA-C  albuterol (PROVENTIL) (2.5 MG/3ML) 0.083% nebulizer solution Take 3 mLs (2.5 mg total) by nebulization every 6 (six) hours as needed for wheezing or shortness of breath. 02/04/19   Poulose, Bethel Born, NP  amantadine (SYMMETREL) 100 MG capsule Take 100 mg by mouth 2 (two) times daily. 03/19/18   Sharmaine Base, MD  blood glucose meter kit and supplies Dispense based on patient and insurance preference. Use up to four times daily as directed. (FOR ICD-10 E10.9, E11.9). Patient not taking: Reported on 07/07/2021 06/24/19   Hubbard Hartshorn, FNP  Budeson-Glycopyrrol-Formoterol (BREZTRI AEROSPHERE) 160-9-4.8 MCG/ACT AERO Inhale 2 puffs into the lungs in the morning and at bedtime. 03/08/21   Tyler Pita, MD  buPROPion (WELLBUTRIN XL) 150 MG 24 hr tablet Take 150 mg by mouth daily. 04/09/18   Sharmaine Base, MD  Cholecalciferol (VITAMIN D-3) 125 MCG (5000 UT) TABS Take 5,000 Units by mouth daily. 10/14/19   Steele Sizer, MD  gabapentin (NEURONTIN) 300 MG capsule Take 300 mg by mouth 2 (two) times daily.    Sharmaine Base, MD  hydrOXYzine (VISTARIL) 50 MG capsule Take 50 mg by mouth 2 (two) times daily as needed for anxiety.    Sharmaine Base, MD  icosapent Ethyl (VASCEPA) 1 g capsule TAKE 2 CAPSULES BY MOUTH TWICE A DAY 04/27/21   Sowles, Drue Stager, MD  INVEGA SUSTENNA 234 MG/1.5ML SUSY injection Inject into the muscle. Inject as directed once a month 06/24/20   [provider]  levothyroxine (SYNTHROID) 75 MCG tablet TAKE 1 TABLET BY MOUTH DAILY AT 6 AM 07/04/21   Steele Sizer, MD  loratadine (CLARITIN) 10 MG tablet Take 10 mg by mouth daily.    [provider]  losartan (COZAAR) 50 MG tablet TAKE 1 TABLET BY MOUTH DAILY 07/12/21   Steele Sizer, MD  malathion (OVIDE) 0.5 % lotion Apply topically. 11/15/20   [provider]  nicotine (NICOTROL) 10 MG inhaler Inhale 1 Cartridge (1 continuous puffing total) into the lungs  as needed for smoking cessation. Patient not taking: Reported on 07/07/2021 12/08/19   Tyler Pita, MD  pantoprazole (PROTONIX) 40 MG tablet TAKE 1 TABLET BY MOUTH DAILY 07/12/21   Steele Sizer, MD  rosuvastatin (CRESTOR) 40 MG tablet TAKE 1 TABLET BY MOUTH DAILY 04/07/21   Steele Sizer, MD  sertraline (ZOLOFT) 100 MG tablet Take 200 mg by mouth daily.    Sharmaine Base, MD    Allergies    Metformin and related, Penicillins, Perphenazine, Sulfa antibiotics, and Abilify [aripiprazole]  Review of Systems   Review of Systems  Constitutional:  Positive for fever.  HENT:  Positive for congestion, ear pain and sore throat. Negative for ear discharge, rhinorrhea, sinus  pressure, sinus pain, trouble swallowing and voice change.   Respiratory:  Negative for cough, shortness of breath, wheezing and stridor.   Cardiovascular:  Negative for chest pain.  Gastrointestinal:  Negative for abdominal pain, diarrhea, nausea and vomiting.  Genitourinary:  Negative for dysuria, flank pain and pelvic pain.  Musculoskeletal:  Positive for myalgias. Negative for back pain.  Skin:  Negative for rash.  Neurological:  Negative for dizziness and headaches.   Physical Exam Updated Vital Signs BP (!) 172/130   Pulse (!) 114   Temp 99.9 F (37.7 C) (Oral)   Resp 18   Ht _0  (1.499 m)   Wt 70.3 kg   SpO2 94%   BMI 31.31 kg/m   Physical Exam Constitutional:      General: She is not in acute distress.    Appearance: She is well-developed.  HENT:     Head: Normocephalic and atraumatic.     Jaw: No trismus.     Right Ear: Hearing, ear canal and external ear normal.     Left Ear: Hearing, ear canal and external ear normal.     Ears:     Comments: Bilateral TM erythema, bulging.  No drainage.    Nose: Rhinorrhea present.     Mouth/Throat:     Mouth: Mucous membranes are moist.     Pharynx: Posterior oropharyngeal erythema present. No oropharyngeal exudate or uvula swelling.     Tonsils: No  tonsillar exudate or tonsillar abscesses.  Eyes:     General:        Right eye: No discharge.        Left eye: No discharge.     Conjunctiva/sclera: Conjunctivae normal.  Cardiovascular:     Rate and Rhythm: Normal rate and regular rhythm.     Pulses: Normal pulses.     Heart sounds: Normal heart sounds.  Pulmonary:     Effort: Pulmonary effort is normal. No respiratory distress.     Breath sounds: Normal breath sounds. No stridor. No wheezing or rales.  Abdominal:     General: There is no distension.     Palpations: Abdomen is soft.     Tenderness: There is no abdominal tenderness.  Musculoskeletal:        General: No deformity. Normal range of motion.     Cervical back: Normal range of motion.  Lymphadenopathy:     Cervical: Cervical adenopathy present.  Skin:    General: Skin is warm and dry.     Findings: No rash.  Neurological:     Mental Status: She is alert and oriented to person, place, and time.     Deep Tendon Reflexes: Reflexes are normal and symmetric.  Psychiatric:        Behavior: Behavior normal.        Thought Content: Thought content normal.    ED Results / Procedures / Treatments   Labs (all labs ordered are listed, but only abnormal results are displayed) Labs Reviewed  GROUP A STREP BY PCR    EKG None  Radiology DG Chest 2 View  Result Date: 07/29/2021 CLINICAL DATA:  Cough shortness of breath rib pain started earlier today. No reported chest pain EXAM: CHEST - 2 VIEW COMPARISON:  February 04, 2020. FINDINGS: Trachea is midline. Cardiomediastinal contours and hilar structures are normal. Lungs are clear. No sign of consolidation or evidence of pleural effusion. No visible pneumothorax. On limited assessment no acute skeletal process. IMPRESSION: No acute cardiopulmonary disease. Electronically Signed  By: Zetta Bills M.D.   On: 07/29/2021 14:44    Procedures Procedures   Medications Ordered in ED Medications  acetaminophen (TYLENOL) tablet  1,000 mg (has no administration in time range)  amoxicillin-clavulanate (AUGMENTIN) 875-125 MG per tablet 1 tablet (has no administration in time range)    ED Course  I have reviewed the triage vital signs and the nursing notes.  Pertinent labs & imaging results that were available during my care of the patient were reviewed by me and considered in my medical decision making (see chart for details).    MDM Rules/Calculators/A&P                         50 year old female with bilateral ear pain, sore throat.  Running a low-grade temp of 99.9.  She was given Tylenol.  Exam shows no signs of strep but she does have bilateral ear infections.  She is started on Augmentin.  Patient stable and ready for discharge.  She understands signs symptoms return to the ER for. Final Clinical Impression(s) / ED Diagnoses Final diagnoses:  Non-recurrent acute suppurative otitis media of both ears without spontaneous rupture of tympanic membranes  Pharyngitis, unspecified etiology    Rx / DC Orders ED Discharge Orders          Ordered    amoxicillin-clavulanate (AUGMENTIN) 875-125 MG tablet  Every 12 hours        07/29/21 1539             Renata Caprice 07/29/21 1545    Delman Kitten, MD 07/29/21 2119

## 2021-07-29 NOTE — ED Triage Notes (Signed)
Pt reports that she is having a sore throat, bilat earache for the last several days. She has been putting peroxide in her ear. She states that she does have a wisdom tooth coming in and is unsure if that is causing her ears to hurt. She feels like she is swallowing glass because of her throat hurting.

## 2021-07-29 NOTE — Discharge Instructions (Addendum)
Please take medications as prescribed.  You may take Tylenol for pain.  Make sure you are drinking lots of fluids.  Return to the ER for any worsening symptoms or urgent changes in health.

## 2021-08-01 ENCOUNTER — Other Ambulatory Visit: Payer: Self-pay | Admitting: Family Medicine

## 2021-08-01 ENCOUNTER — Telehealth: Payer: Self-pay

## 2021-08-01 NOTE — Telephone Encounter (Signed)
Copied from [place] [phone]. Topic: General - Other >> Aug 01, 2021  2:33 PM Tessa Lerner A wrote: Reason for CRM: Patient would like to be contacted by a member of staff regarding coordination of prescriptions between their PCP and [facility]. [facility]  Please contact further when possible

## 2021-08-11 ENCOUNTER — Ambulatory Visit (INDEPENDENT_AMBULATORY_CARE_PROVIDER_SITE_OTHER): Payer: Medicare Other | Admitting: Unknown Physician Specialty

## 2021-08-11 ENCOUNTER — Encounter: Payer: Self-pay | Admitting: Unknown Physician Specialty

## 2021-08-11 ENCOUNTER — Other Ambulatory Visit: Payer: Self-pay

## 2021-08-11 VITALS — BP 126/82 | HR 96 | Temp 98.4°F | Resp 18 | Ht 59.0 in | Wt 150.9 lb

## 2021-08-11 DIAGNOSIS — J449 Chronic obstructive pulmonary disease, unspecified: Secondary | ICD-10-CM | POA: Diagnosis not present

## 2021-08-11 DIAGNOSIS — R5383 Other fatigue: Secondary | ICD-10-CM

## 2021-08-11 DIAGNOSIS — H65493 Other chronic nonsuppurative otitis media, bilateral: Secondary | ICD-10-CM

## 2021-08-11 DIAGNOSIS — J209 Acute bronchitis, unspecified: Secondary | ICD-10-CM | POA: Diagnosis not present

## 2021-08-11 DIAGNOSIS — G4730 Sleep apnea, unspecified: Secondary | ICD-10-CM

## 2021-08-11 DIAGNOSIS — G4733 Obstructive sleep apnea (adult) (pediatric): Secondary | ICD-10-CM | POA: Diagnosis not present

## 2021-08-11 DIAGNOSIS — J42 Unspecified chronic bronchitis: Secondary | ICD-10-CM | POA: Diagnosis not present

## 2021-08-11 DIAGNOSIS — E1129 Type 2 diabetes mellitus with other diabetic kidney complication: Secondary | ICD-10-CM | POA: Diagnosis not present

## 2021-08-11 HISTORY — DX: Sleep apnea, unspecified: G47.30

## 2021-08-11 MED ORDER — DOXYCYCLINE HYCLATE 100 MG PO TABS: 100.0000 mg | ORAL_TABLET | Freq: Two times a day (BID) | ORAL | 0 refills | Status: DC

## 2021-08-11 NOTE — Addendum Note (Signed)
Addended by: Kathrine Haddock on: 08/11/2021 10:28 AM   Modules accepted: Orders

## 2021-08-11 NOTE — Assessment & Plan Note (Signed)
Demonstrated poor technique. Reviewed technique with good return demonstration

## 2021-08-11 NOTE — Progress Notes (Signed)
BP 126/82   Pulse 96   Temp 98.4 F (36.9 C) (Oral)   Resp 18   Ht '4\' 11"'$  (1.499 m)   Wt 150 lb 14.4 oz (68.4 kg)   SpO2 99%   BMI 30.48 kg/m    Subjective:    Patient ID: April Luna, female    DOB: February 12, 1971, 50 y.o.   MRN: [id number]  HPI: April Luna is a 50 y.o. female  Chief Complaint  Patient presents with   Follow-up    Ear pain, swollen neck    Pt diagnosed with bilateral OM on 8/5.  Given Augmentin and improved.  She wants to make sure it is gone.  States she is still having symptoms in left ear where she still feels "sloshing around" and feels it into the left lneck.  Tired, sleepy, fatigued most of the time x 2 months.  Has COPD with the last chest x-ray 8/5 in the ER showing no acute illness.  Pt is 49 and not yet eligible for LDCT.  Uses Breztri twice a day.  Uses albuterol prn about 2-3 times/week.    ER note reviewed and given Augmentin x 10 days Review of labs 3 months ago shows elevated WBC, high calcium (Tums?), and normal thyroid.  Known sleep apnea but not on CPAP  Relevant past medical, surgical, family and social history reviewed and updated as indicated. Interim medical history since our last visit reviewed. Allergies and medications reviewed and updated.  Review of Systems  Per HPI unless specifically indicated above     Objective:    BP 126/82   Pulse 96   Temp 98.4 F (36.9 C) (Oral)   Resp 18   Ht '4\' 11"'$  (1.499 m)   Wt 150 lb 14.4 oz (68.4 kg)   SpO2 99%   BMI 30.48 kg/m   Wt Readings from Last 3 Encounters:  08/11/21 150 lb 14.4 oz (68.4 kg)  07/29/21 155 lb (70.3 kg)  06/20/21 153 lb (69.4 kg)    Physical Exam Constitutional:      General: She is not in acute distress.    Appearance: Normal appearance. She is well-developed.  HENT:     Head: Normocephalic and atraumatic.     Comments: Bilateral canals red with ? Left perforated TM Eyes:     General: Lids are normal. No scleral icterus.       Right eye: No  discharge.        Left eye: No discharge.     Conjunctiva/sclera: Conjunctivae normal.  Neck:     Vascular: No carotid bruit or JVD.  Cardiovascular:     Rate and Rhythm: Normal rate and regular rhythm.     Heart sounds: Normal heart sounds.  Pulmonary:     Effort: Pulmonary effort is normal. No respiratory distress.     Breath sounds: Wheezing and rhonchi present.  Abdominal:     Palpations: There is no hepatomegaly or splenomegaly.  Musculoskeletal:        General: Normal range of motion.     Cervical back: Normal range of motion and neck supple.  Skin:    General: Skin is warm and dry.     Coloration: Skin is not pale.     Findings: No rash.  Neurological:     Mental Status: She is alert and oriented to person, place, and time.  Psychiatric:        Behavior: Behavior normal.        Thought  Content: Thought content normal.        Judgment: Judgment normal.    Results for orders placed or performed during the hospital encounter of 07/29/21  Group A Strep by PCR   Specimen: Throat; Sterile Swab  Result Value Ref Range   Group A Strep by PCR NOT DETECTED NOT DETECTED      Assessment & Plan:   Problem List Items Addressed This Visit       Unprioritized   Chronic obstructive pulmonary disease ([place])    Demonstrated poor technique. Reviewed technique with good return demonstration      Sleep apnea    Known sleep apnea.  Worked up by pulmonary.  Will contact to discuss treatment      Other Visit Diagnoses     Other chronic nonsuppurative otitis media of both ears    -  Primary   Not completely resolved with 10 day course of Augmentin from ER.  ? left TM perforation.  Add Doxycycline for atypical   Relevant Medications   doxycycline (VIBRA-TABS) 100 MG tablet   Other Relevant Orders   Ambulatory referral to ENT   Acute exacerbation of chronic bronchitis ([place])       Not completely resolved with Augmentin.  Rx for Doxycycline        Follow up plan: Return for  follow up with pulmonary and ENT.

## 2021-08-11 NOTE — Assessment & Plan Note (Signed)
Known sleep apnea.  Worked up by pulmonary.  Will contact to discuss treatment

## 2021-08-12 LAB — CBC WITH DIFFERENTIAL/PLATELET
Absolute Monocytes: 484 cells/uL (ref 200–950)
Basophils Absolute: 62 cells/uL (ref 0–200)
Basophils Relative: 0.6 %
Eosinophils Absolute: 165 cells/uL (ref 15–500)
Eosinophils Relative: 1.6 %
HCT: 42.8 % (ref 35.0–45.0)
Hemoglobin: 14.5 g/dL (ref 11.7–15.5)
Lymphs Abs: 1936 cells/uL (ref 850–3900)
MCH: 32.2 pg (ref 27.0–33.0)
MCHC: 33.9 g/dL (ref 32.0–36.0)
MCV: 95.1 fL (ref 80.0–100.0)
MPV: 9.2 fL (ref 7.5–12.5)
Monocytes Relative: 4.7 %
Neutro Abs: 7653 cells/uL (ref 1500–7800)
Neutrophils Relative %: 74.3 %
Platelets: 220 10*3/uL (ref 140–400)
RBC: 4.5 10*6/uL (ref 3.80–5.10)
RDW: 12.9 % (ref 11.0–15.0)
Total Lymphocyte: 18.8 %
WBC: 10.3 10*3/uL (ref 3.8–10.8)

## 2021-08-12 LAB — COMPREHENSIVE METABOLIC PANEL
AG Ratio: 1.5 (calc) (ref 1.0–2.5)
ALT: 10 U/L (ref 6–29)
AST: 10 U/L (ref 10–35)
Albumin: 4 g/dL (ref 3.6–5.1)
Alkaline phosphatase (APISO): 109 U/L (ref 31–125)
BUN/Creatinine Ratio: 6 (calc) (ref 6–22)
BUN: 6 mg/dL — ABNORMAL LOW (ref 7–25)
CO2: 29 mmol/L (ref 20–32)
Calcium: 8.9 mg/dL (ref 8.6–10.2)
Chloride: 102 mmol/L (ref 98–110)
Creat: 0.94 mg/dL (ref 0.50–0.99)
Globulin: 2.6 g/dL (calc) (ref 1.9–3.7)
Glucose, Bld: 205 mg/dL — ABNORMAL HIGH (ref 65–99)
Potassium: 3.8 mmol/L (ref 3.5–5.3)
Sodium: 140 mmol/L (ref 135–146)
Total Bilirubin: 0.2 mg/dL (ref 0.2–1.2)
Total Protein: 6.6 g/dL (ref 6.1–8.1)

## 2021-08-12 LAB — HEMOGLOBIN A1C
Hgb A1c MFr Bld: 7.1 % of total Hgb — ABNORMAL HIGH (ref <5.7)
Mean Plasma Glucose: 157 mg/dL
eAG (mmol/L): 8.7 mmol/L

## 2021-09-26 ENCOUNTER — Other Ambulatory Visit: Payer: Self-pay | Admitting: Family Medicine

## 2021-09-26 DIAGNOSIS — E1169 Type 2 diabetes mellitus with other specified complication: Secondary | ICD-10-CM

## 2021-09-26 DIAGNOSIS — E785 Hyperlipidemia, unspecified: Secondary | ICD-10-CM

## 2021-09-26 DIAGNOSIS — E032 Hypothyroidism due to medicaments and other exogenous substances: Secondary | ICD-10-CM

## 2021-09-28 ENCOUNTER — Other Ambulatory Visit: Payer: Self-pay

## 2021-09-28 ENCOUNTER — Telehealth: Payer: Self-pay

## 2021-09-28 DIAGNOSIS — R809 Proteinuria, unspecified: Secondary | ICD-10-CM

## 2021-09-28 DIAGNOSIS — E1129 Type 2 diabetes mellitus with other diabetic kidney complication: Secondary | ICD-10-CM

## 2021-09-28 NOTE — Telephone Encounter (Signed)
Referral placed.

## 2021-09-28 NOTE — Telephone Encounter (Signed)
Copied from [place] [id number]. Topic: Referral - Request for Referral >> Sep 28, 2021  1:17 PM Tessa Lerner A wrote: Has patient seen PCP for this complaint? No.  *If NO, is insurance requiring patient see PCP for this issue before PCP can refer them?  Referral for which specialty: Opthalmology   Preferred provider/office: [facility]  Reason for referral: Patient needs a diabetic eye screening

## 2021-10-04 ENCOUNTER — Other Ambulatory Visit: Payer: Self-pay | Admitting: Family Medicine

## 2021-10-04 DIAGNOSIS — I10 Essential (primary) hypertension: Secondary | ICD-10-CM

## 2021-10-04 DIAGNOSIS — E1129 Type 2 diabetes mellitus with other diabetic kidney complication: Secondary | ICD-10-CM

## 2021-10-04 DIAGNOSIS — K219 Gastro-esophageal reflux disease without esophagitis: Secondary | ICD-10-CM

## 2021-10-06 ENCOUNTER — Encounter: Payer: Self-pay | Admitting: Unknown Physician Specialty

## 2021-10-06 ENCOUNTER — Ambulatory Visit (INDEPENDENT_AMBULATORY_CARE_PROVIDER_SITE_OTHER): Payer: Medicare Other | Admitting: Unknown Physician Specialty

## 2021-10-06 VITALS — Temp 99.0°F | Ht 59.5 in | Wt 150.0 lb

## 2021-10-06 DIAGNOSIS — H9203 Otalgia, bilateral: Secondary | ICD-10-CM | POA: Diagnosis not present

## 2021-10-06 DIAGNOSIS — J0191 Acute recurrent sinusitis, unspecified: Secondary | ICD-10-CM | POA: Diagnosis not present

## 2021-10-06 MED ORDER — DOXYCYCLINE HYCLATE 100 MG PO TABS: 100.0000 mg | ORAL_TABLET | Freq: Two times a day (BID) | ORAL | 0 refills | Status: DC

## 2021-10-06 NOTE — Progress Notes (Signed)
Temp 99 F (37.2 C)   Ht 4' 11.5" (1.511 m)   Wt 150 lb (68 kg)   BMI 29.79 kg/m    Subjective:    Patient ID: April Luna, female    DOB: 05-06-1971, 50 y.o.   MRN: [id number]  HPI: April Luna is a 50 y.o. female  Chief Complaint  Patient presents with   Ear Pain    Radiating down neck, sweats, just recently had double ear infection  This visit was completed via telephone due to the restrictions of the COVID-19 pandemic. All issues as above were discussed and addressed but no physical exam was performed. If it was felt that the patient should be evaluated in the office, they were directed there. The patient verbally consented to this visit. Patient was unable to complete an audio/visual visit due to Technical difficulties. Location of the patient: home Location of the provider: work Those involved with this call:  Time spent on call: 30 minutes including chart review I verified patient identity using two factors (patient name and date of birth). Patient consents verbally to being seen via telemedicine visit today.    Pt states she is having ear pain and "gland pain" and neck pain.  States this has been a problem for about 1 week.  Having night sweats, sometimes chills.  Is having "a little" SOB but hasn't been around any sick contacts.  Went to the ER about 2 months ago for similar symptoms.  States she needed 2 rounds of antibiotics.  One was Augmentin and and received Doxycycline.    Relevant past medical, surgical, family and social history reviewed and updated as indicated. Interim medical history since our last visit reviewed. Allergies and medications reviewed and updated.  Review of Systems  Per HPI unless specifically indicated above     Objective:    Temp 99 F (37.2 C)   Ht 4' 11.5" (1.511 m)   Wt 150 lb (68 kg)   BMI 29.79 kg/m   Wt Readings from Last 3 Encounters:  10/06/21 150 lb (68 kg)  08/11/21 150 lb 14.4 oz (68.4 kg)  07/29/21 155  lb (70.3 kg)    Physical Exam Neurological:     Mental Status: She is alert.  Psychiatric:        Mood and Affect: Mood normal.    Results for orders placed or performed in visit on 08/11/21  CBC with Differential/Platelet  Result Value Ref Range   WBC 10.3 3.8 - 10.8 Thousand/uL   RBC 4.50 3.80 - 5.10 Million/uL   Hemoglobin 14.5 11.7 - 15.5 g/dL   HCT 42.8 35.0 - 45.0 %   MCV 95.1 80.0 - 100.0 fL   MCH 32.2 27.0 - 33.0 pg   MCHC 33.9 32.0 - 36.0 g/dL   RDW 12.9 11.0 - 15.0 %   Platelets 220 140 - 400 Thousand/uL   MPV 9.2 7.5 - 12.5 fL   Neutro Abs 7,653 1,500 - 7,800 cells/uL   Lymphs Abs 1,936 850 - 3,900 cells/uL   Absolute Monocytes 484 200 - 950 cells/uL   Eosinophils Absolute 165 15 - 500 cells/uL   Basophils Absolute 62 0 - 200 cells/uL   Neutrophils Relative % 74.3 %   Total Lymphocyte 18.8 %   Monocytes Relative 4.7 %   Eosinophils Relative 1.6 %   Basophils Relative 0.6 %  Comprehensive metabolic panel  Result Value Ref Range   Glucose, Bld 205 (H) 65 - 99 mg/dL  BUN 6 (L) 7 - 25 mg/dL   Creat 0.94 0.50 - 0.99 mg/dL   BUN/Creatinine Ratio 6 6 - 22 (calc)   Sodium 140 135 - 146 mmol/L   Potassium 3.8 3.5 - 5.3 mmol/L   Chloride 102 98 - 110 mmol/L   CO2 29 20 - 32 mmol/L   Calcium 8.9 8.6 - 10.2 mg/dL   Total Protein 6.6 6.1 - 8.1 g/dL   Albumin 4.0 3.6 - 5.1 g/dL   Globulin 2.6 1.9 - 3.7 g/dL (calc)   AG Ratio 1.5 1.0 - 2.5 (calc)   Total Bilirubin 0.2 0.2 - 1.2 mg/dL   Alkaline phosphatase (APISO) 109 31 - 125 U/L   AST 10 10 - 35 U/L   ALT 10 6 - 29 U/L  Hemoglobin A1c  Result Value Ref Range   Hgb A1c MFr Bld 7.1 (H) <5.7 % of total Hgb   Mean Plasma Glucose 157 mg/dL   eAG (mmol/L) 8.7 mmol/L      Assessment & Plan:   Problem List Items Addressed This Visit   None Visit Diagnoses     Otalgia of both ears    -  Primary   Acute recurrent sinusitis, unspecified location       symptoms similar to previous when diagnosed with bilateral ear  infection.  Hx of COPD which is not optimally controlled.  Rx for Doxycycline.  F/U with pulmonar   Relevant Medications   doxycycline (VIBRA-TABS) 100 MG tablet       Encouraged inhaler use.    Follow up plan: Return if symptoms worsen or fail to improve.

## 2021-10-24 ENCOUNTER — Ambulatory Visit (INDEPENDENT_AMBULATORY_CARE_PROVIDER_SITE_OTHER): Payer: Medicare Other | Admitting: Family Medicine

## 2021-10-24 ENCOUNTER — Other Ambulatory Visit: Payer: Self-pay

## 2021-10-24 ENCOUNTER — Telehealth: Payer: Self-pay

## 2021-10-24 ENCOUNTER — Encounter: Payer: Self-pay | Admitting: Family Medicine

## 2021-10-24 VITALS — BP 134/72 | HR 98 | Temp 98.3°F | Resp 16 | Ht 60.0 in | Wt 151.8 lb

## 2021-10-24 DIAGNOSIS — E1169 Type 2 diabetes mellitus with other specified complication: Secondary | ICD-10-CM

## 2021-10-24 DIAGNOSIS — K219 Gastro-esophageal reflux disease without esophagitis: Secondary | ICD-10-CM | POA: Diagnosis not present

## 2021-10-24 DIAGNOSIS — E1129 Type 2 diabetes mellitus with other diabetic kidney complication: Secondary | ICD-10-CM | POA: Diagnosis not present

## 2021-10-24 DIAGNOSIS — Z1211 Encounter for screening for malignant neoplasm of colon: Secondary | ICD-10-CM | POA: Diagnosis not present

## 2021-10-24 DIAGNOSIS — E785 Hyperlipidemia, unspecified: Secondary | ICD-10-CM

## 2021-10-24 DIAGNOSIS — Z1231 Encounter for screening mammogram for malignant neoplasm of breast: Secondary | ICD-10-CM

## 2021-10-24 DIAGNOSIS — F17200 Nicotine dependence, unspecified, uncomplicated: Secondary | ICD-10-CM

## 2021-10-24 DIAGNOSIS — E032 Hypothyroidism due to medicaments and other exogenous substances: Secondary | ICD-10-CM

## 2021-10-24 DIAGNOSIS — N1831 Chronic kidney disease, stage 3a: Secondary | ICD-10-CM | POA: Diagnosis not present

## 2021-10-24 DIAGNOSIS — N6452 Nipple discharge: Secondary | ICD-10-CM

## 2021-10-24 DIAGNOSIS — K0889 Other specified disorders of teeth and supporting structures: Secondary | ICD-10-CM

## 2021-10-24 DIAGNOSIS — G4733 Obstructive sleep apnea (adult) (pediatric): Secondary | ICD-10-CM

## 2021-10-24 DIAGNOSIS — K1120 Sialoadenitis, unspecified: Secondary | ICD-10-CM

## 2021-10-24 DIAGNOSIS — I10 Essential (primary) hypertension: Secondary | ICD-10-CM | POA: Diagnosis not present

## 2021-10-24 DIAGNOSIS — F3132 Bipolar disorder, current episode depressed, moderate: Secondary | ICD-10-CM

## 2021-10-24 DIAGNOSIS — R809 Proteinuria, unspecified: Secondary | ICD-10-CM

## 2021-10-24 MED ORDER — LUBIPROSTONE 24 MCG PO CAPS: ORAL_CAPSULE | ORAL | 3 refills | Status: DC

## 2021-10-24 MED ORDER — ICOSAPENT ETHYL 1 G PO CAPS: 2.0000 g | ORAL_CAPSULE | Freq: Two times a day (BID) | ORAL | 3 refills | Status: DC

## 2021-10-24 MED ORDER — LEVOTHYROXINE SODIUM 75 MCG PO TABS: 75.0000 ug | ORAL_TABLET | Freq: Every day | ORAL | 3 refills | Status: DC

## 2021-10-24 MED ORDER — LOSARTAN POTASSIUM 50 MG PO TABS: 50.0000 mg | ORAL_TABLET | Freq: Every day | ORAL | 3 refills | Status: DC

## 2021-10-24 MED ORDER — PANTOPRAZOLE SODIUM 40 MG PO TBEC: 40.0000 mg | DELAYED_RELEASE_TABLET | Freq: Every day | ORAL | 3 refills | Status: DC

## 2021-10-24 MED ORDER — ROSUVASTATIN CALCIUM 40 MG PO TABS: 40.0000 mg | ORAL_TABLET | Freq: Every day | ORAL | 3 refills | Status: DC

## 2021-10-24 NOTE — Telephone Encounter (Signed)
Copied from [place] [id number]. Topic: Referral - Status >> Oct 24, 2021 12:27 PM Yvette Rack wrote: Reason for CRM: Pt stated she has been going to [facility] in [place] and she would like the referral to be sent to that location.

## 2021-10-24 NOTE — Progress Notes (Signed)
Name: April Luna   MRN: [id number]    DOB: [birth year]   Date:10/24/2021       Progress Note  Subjective  Chief Complaint  Follow up   HPI  COPD: she sees Dr. Duwayne Heck and is now on [place]. She has chronic cough and sputum that is yellow, wheezing has improved - usually only notices it at night. She also had a sleep study that was positive for OSA but cannot afford CPAP at thist ime. She is still smoking, back to one pack daily, used to smoke close to 2 packs in the past .She is still contemplating quitting, she has nicotrol at home but not using it at this time    HTN: bp is at goal, denies orthostatic changes , she has intermittent pain under her breast, usually when she is stressed, usually during panic attacks. Taking losartan and denies side effects    Hyperlipidemia: we switched from Pravachol to Crestor , she states she forgets to take medication, she states Armen Pickup is trying to help her be more compliant with medication , advised to take it in am's with other medications   GERD: currently no heartburn or indigestion    DMII: with dyslipidemia, on diet only, A1C it went from 6.2 % to 7.1 %, she states not as compliant with her diet, drinking sweet tea daily. She feels thirsty but no polyphagia or polyuria. She has  microalbuminuria and CKI stage III , she has dyslipidemia , she has been taking statin therapy and also ARB   Bipolar disorder: she is seeing psychiatrist Dr. Loni Muse at [facility]  she states she has been stable, she has a manic episode prior to last injection but back to baseline now. Last admission was 2016 for a psychotic episode. No history of suicide attempts.   Hypothyroidism: she is taking levothyroxine and denies hair loss, no change in bowel movement, she has chronic dry skin Last TSH was at goal. Continue current regiment     Chronic right lower back pain: going on for years, she states got better when adjusted, seems to be right on top of sacro- iliac  joint, she states occasionally shoots down to her right lower leg. Taking gabapentin and prn tylenol and symptoms are stable.   Psoriasis: seen by dermatologist in the past, Dr. Koleen Nimrod, she was given a refill but unable to go due to transportation   Toothache: she has cavities and is looking for a dentist, she states over the past few days pain worse on right molars. Gum is not swollen    Left nipple discharge: noticed a few days ago and a lump on left breast,, it is yellowish brown in color,  she did not schedule her screening  mammogram, we will order diagnostic test   Sialadenitis: she states her submandibular glands gets swollen and tender on and off for the past couple of years, at this time no pain. She states she wakes up at night intermittent drenched in sweat ( she had an ablation at age 31 and no cycles since) explained it may be night sweats from estrogen deficiency   Patient Active Problem List   Diagnosis Date Noted   Sleep apnea 08/11/2021   Plantar callus 10/06/2019   Acquired hallux limitus of both feet 10/06/2019   Type 2 diabetes mellitus with microalbuminuria, without long-term current use of insulin ([place]) 06/24/2019   Chronic obstructive pulmonary disease ([place]) 06/24/2019   Chronic constipation 06/24/2019   ASCUS of cervix with negative  high risk HPV 09/05/2018   GERD without esophagitis 04/11/2018   Hyperlipidemia 04/11/2018   Hypertension 04/11/2018   Hypothyroidism 04/26/2015   Bipolar I disorder, most recent episode (or current) manic ([place]) 04/25/2015   Delirium due to another medical condition 04/25/2015   Cannabis abuse 04/25/2015    Past Surgical History:  Procedure Laterality Date   CESAREAN SECTION     CYSTOSCOPY W/ RETROGRADES Left 11/08/2018   Procedure: CYSTOSCOPY WITH RETROGRADE PYELOGRAM;  Surgeon: Billey Co, MD;  Location: [facility] ORS;  Service: Urology;  Laterality: Left;   CYSTOSCOPY/URETEROSCOPY/HOLMIUM LASER/STENT PLACEMENT Left  11/08/2018   Procedure: CYSTOSCOPY/URETEROSCOPY/HOLMIUM LASER/STENT PLACEMENT;  Surgeon: Billey Co, MD;  Location: [facility] ORS;  Service: Urology;  Laterality: Left;   MOUTH SURGERY     wisdom teeth extraction    Family History  Problem Relation Age of Onset   Depression Mother    Anxiety disorder Mother    Diabetes Mother    Hypertension Mother    Hyperlipidemia Mother    Cancer Mother    Cancer Father        colon cancer   Depression Brother    Anxiety disorder Brother    Diabetes Mellitus II Maternal Grandmother    Hypercholesterolemia Maternal Grandmother    Cancer Paternal Grandmother    Diabetes Paternal Grandmother     Social History   Tobacco Use   Smoking status: Every Day    Packs/day: 1.00    Years: 34.00    Pack years: 34.00    Types: Cigarettes    Start date: 05/13/1985   Smokeless tobacco: Never   Tobacco comments:    1 PPD 02/24/2021  Substance Use Topics   Alcohol use: Not Currently    Alcohol/week: 4.0 standard drinks    Types: 4 Glasses of wine per week    Comment: occasional     Current Outpatient Medications:    albuterol (PROVENTIL) (2.5 MG/3ML) 0.083% nebulizer solution, Take 3 mLs (2.5 mg total) by nebulization every 6 (six) hours as needed for wheezing or shortness of breath., Disp: 150 mL, Rfl: 1   amantadine (SYMMETREL) 100 MG capsule, Take 100 mg by mouth 2 (two) times daily., Disp: , Rfl:    blood glucose meter kit and supplies, Dispense based on patient and insurance preference. Use up to four times daily as directed. (FOR ICD-10 E10.9, E11.9)., Disp: 1 each, Rfl: 0   Budeson-Glycopyrrol-Formoterol (BREZTRI AEROSPHERE) 160-9-4.8 MCG/ACT AERO, Inhale 2 puffs into the lungs in the morning and at bedtime., Disp: 5.9 g, Rfl: 11   buPROPion (WELLBUTRIN XL) 150 MG 24 hr tablet, Take 150 mg by mouth daily., Disp: , Rfl:    Cholecalciferol (VITAMIN D-3) 125 MCG (5000 UT) TABS, Take 5,000 Units by mouth daily., Disp: 30 tablet, Rfl: 1    gabapentin (NEURONTIN) 300 MG capsule, Take 300 mg by mouth 2 (two) times daily., Disp: , Rfl:    hydrOXYzine (VISTARIL) 50 MG capsule, Take 50 mg by mouth 2 (two) times daily as needed for anxiety., Disp: , Rfl:    INVEGA SUSTENNA 234 MG/1.5ML SUSY injection, Inject 1.5 mLs into the muscle every 30 (thirty) days. Inject as directed once a month, Disp: , Rfl:    loratadine (CLARITIN) 10 MG tablet, Take 10 mg by mouth daily., Disp: , Rfl:    nicotine (NICOTROL) 10 MG inhaler, Inhale 1 Cartridge (1 continuous puffing total) into the lungs as needed for smoking cessation., Disp: 36 each, Rfl: 3   sertraline (ZOLOFT) 100 MG  tablet, Take 200 mg by mouth daily., Disp: , Rfl:    icosapent Ethyl (VASCEPA) 1 g capsule, Take 2 capsules (2 g total) by mouth 2 (two) times daily., Disp: 120 capsule, Rfl: 3   levothyroxine (SYNTHROID) 75 MCG tablet, Take 1 tablet (75 mcg total) by mouth daily before breakfast., Disp: 30 tablet, Rfl: 3   losartan (COZAAR) 50 MG tablet, Take 1 tablet (50 mg total) by mouth daily., Disp: 30 tablet, Rfl: 3   lubiprostone (AMITIZA) 24 MCG capsule, TAKE 1 CAPSULE BY MOUTH TWICE A DAY WITHA MEAL, Disp: 60 capsule, Rfl: 3   pantoprazole (PROTONIX) 40 MG tablet, Take 1 tablet (40 mg total) by mouth daily., Disp: 30 tablet, Rfl: 3   rosuvastatin (CRESTOR) 40 MG tablet, Take 1 tablet (40 mg total) by mouth daily., Disp: 30 tablet, Rfl: 3  Allergies  Allergen Reactions   Metformin And Related Nausea And Vomiting   Penicillins Other (See Comments)    She has taken amoxicillin without problems   Perphenazine Other (See Comments)    Tremors, muscle weakness, tongue swelling   Sulfa Antibiotics Other (See Comments)    GI distress    Abilify [Aripiprazole] Rash    I personally reviewed active problem list, medication list, allergies, family history, social history with the patient/caregiver today.   ROS  Constitutional: Negative for fever or weight change.  Respiratory: Negative  for cough and shortness of breath.   Cardiovascular: Negative for chest pain or palpitations.  Gastrointestinal: Negative for abdominal pain, no bowel changes.  Musculoskeletal: Negative for gait problem or joint swelling.  Skin: Negative for rash.  Neurological: Negative for dizziness or headache.  No other specific complaints in a complete review of systems (except as listed in HPI above).   Objective  Vitals:   10/24/21 1127  BP: 134/72  Pulse: 98  Resp: 16  Temp: 98.3 F (36.8 C)  TempSrc: Oral  SpO2: 99%  Weight: 151 lb 12.8 oz (68.9 kg)  Height: 5' (1.524 m)    Body mass index is 29.65 kg/m.  Physical Exam  Constitutional: Patient appears well-developed and well-nourished. Overweight.  No distress.  HEENT: head atraumatic, normocephalic, pupils equal and reactive to light, ears normal TM, neck supple, throat within normal limits, poor dentition with missing teeth and cavities , submandibularly glands slightly tender but soft to touch Cardiovascular: Normal rate, regular rhythm and normal heart sounds.  No murmur heard. No BLE edema. Breast; no lumps or nipple discharge during exam  Pulmonary/Chest: Effort normal and breath sounds normal. No respiratory distress. Abdominal: Soft.  There is no tenderness. Psychiatric: Patient has a normal mood and affect. behavior is normal. Judgment and thought content normal.   Recent Results (from the past 2160 hour(s))  Group A Strep by PCR     Status: None   Collection Time: 07/29/21  2:03 PM   Specimen: Throat; Sterile Swab  Result Value Ref Range   Group A Strep by PCR NOT DETECTED NOT DETECTED    Comment: Performed at [facility], [address] [place], [place] [address]  CBC with Differential/Platelet     Status: None   Collection Time: 08/11/21 10:31 AM  Result Value Ref Range   WBC 10.3 3.8 - 10.8 Thousand/uL   RBC 4.50 3.80 - 5.10 Million/uL   Hemoglobin 14.5 11.7 - 15.5 g/dL   HCT 42.8 35.0 - 45.0 %    MCV 95.1 80.0 - 100.0 fL   MCH 32.2 27.0 - 33.0 pg  MCHC 33.9 32.0 - 36.0 g/dL   RDW 12.9 11.0 - 15.0 %   Platelets 220 140 - 400 Thousand/uL   MPV 9.2 7.5 - 12.5 fL   Neutro Abs 7,653 1,500 - 7,800 cells/uL   Lymphs Abs 1,936 850 - 3,900 cells/uL   Absolute Monocytes 484 200 - 950 cells/uL   Eosinophils Absolute 165 15 - 500 cells/uL   Basophils Absolute 62 0 - 200 cells/uL   Neutrophils Relative % 74.3 %   Total Lymphocyte 18.8 %   Monocytes Relative 4.7 %   Eosinophils Relative 1.6 %   Basophils Relative 0.6 %  Comprehensive metabolic panel     Status: Abnormal   Collection Time: 08/11/21 10:31 AM  Result Value Ref Range   Glucose, Bld 205 (H) 65 - 99 mg/dL    Comment: .            Fasting reference interval . For someone without known diabetes, a glucose value >125 mg/dL indicates that they may have diabetes and this should be confirmed with a follow-up test. .    BUN 6 (L) 7 - 25 mg/dL   Creat 0.94 0.50 - 0.99 mg/dL   BUN/Creatinine Ratio 6 6 - 22 (calc)   Sodium 140 135 - 146 mmol/L   Potassium 3.8 3.5 - 5.3 mmol/L   Chloride 102 98 - 110 mmol/L   CO2 29 20 - 32 mmol/L   Calcium 8.9 8.6 - 10.2 mg/dL   Total Protein 6.6 6.1 - 8.1 g/dL   Albumin 4.0 3.6 - 5.1 g/dL   Globulin 2.6 1.9 - 3.7 g/dL (calc)   AG Ratio 1.5 1.0 - 2.5 (calc)   Total Bilirubin 0.2 0.2 - 1.2 mg/dL   Alkaline phosphatase (APISO) 109 31 - 125 U/L   AST 10 10 - 35 U/L   ALT 10 6 - 29 U/L  Hemoglobin A1c     Status: Abnormal   Collection Time: 08/11/21 10:31 AM  Result Value Ref Range   Hgb A1c MFr Bld 7.1 (H) <5.7 % of total Hgb    Comment: For someone without known diabetes, a hemoglobin A1c value of 6.5% or greater indicates that they may have  diabetes and this should be confirmed with a follow-up  test. . For someone with known diabetes, a value <7% indicates  that their diabetes is well controlled and a value  greater than or equal to 7% indicates suboptimal  control. A1c targets  should be individualized based on  duration of diabetes, age, comorbid conditions, and  other considerations. . Currently, no consensus exists regarding use of hemoglobin A1c for diagnosis of diabetes for children. .    Mean Plasma Glucose 157 mg/dL   eAG (mmol/L) 8.7 mmol/L      PHQ2/9: Depression screen [facility] 2/9 10/24/2021 10/06/2021 08/11/2021 07/07/2021 06/20/2021  Decreased Interest 1 0 0 0 2  Down, Depressed, Hopeless 1 0 0 0 0  PHQ - 2 Score 2 0 0 0 2  Altered sleeping 1 0 - 0 1  Tired, decreased energy 2 0 - 0 3  Change in appetite 3 0 - 0 3  Feeling bad or failure about yourself  1 0 - 0 0  Trouble concentrating 1 0 - 3 0  Moving slowly or fidgety/restless 1 0 - 3 0  Suicidal thoughts 0 0 - 0 0  PHQ-9 Score 11 0 - 6 9  Difficult doing work/chores Somewhat difficult Not difficult at all - - -  Some  recent data might be hidden    phq 9 is positive   Fall Risk: Fall Risk  10/24/2021 10/06/2021 08/11/2021 07/07/2021 06/20/2021  Falls in the past year? 0 0 0 1 0  Number falls in past yr: - 0 0 0 0  Injury with Fall? - 0 0 0 0  Risk for fall due to : - - - No Fall Risks -  Follow up Falls prevention discussed - Falls evaluation completed Falls prevention discussed -      Functional Status Survey: Is the patient deaf or have difficulty hearing?: Yes Does the patient have difficulty seeing, even when wearing glasses/contacts?: No Does the patient have difficulty concentrating, remembering, or making decisions?: No Does the patient have difficulty walking or climbing stairs?: No Does the patient have difficulty dressing or bathing?: No Does the patient have difficulty doing errands alone such as visiting a doctor's office or shopping?: No    Assessment & Plan  1. Type 2 diabetes mellitus with microalbuminuria, without long-term current use of insulin (HCC)  - losartan (COZAAR) 50 MG tablet; Take 1 tablet (50 mg total) by mouth daily.  Dispense: 30 tablet; Refill:  3  2. Breast cancer screening by mammogram  - MM Digital Screening; Future  3. Discharge from left nipple  - MM Digital Diagnostic Bilat; Future - US BREAST LTD UNI LEFT INC AXILLA; Future  4. Stage 3a chronic kidney disease ([place])   5. Colon cancer screening  - Ambulatory referral to Gastroenterology  6. GERD without esophagitis  - pantoprazole (PROTONIX) 40 MG tablet; Take 1 tablet (40 mg total) by mouth daily.  Dispense: 30 tablet; Refill: 3  7. Dyslipidemia associated with type 2 diabetes mellitus (HCC)  - icosapent Ethyl (VASCEPA) 1 g capsule; Take 2 capsules (2 g total) by mouth 2 (two) times daily.  Dispense: 120 capsule; Refill: 3 - rosuvastatin (CRESTOR) 40 MG tablet; Take 1 tablet (40 mg total) by mouth daily.  Dispense: 30 tablet; Refill: 3  8. Bipolar affective disorder, currently depressed, moderate ([place])  She states she forgets to call her sub-specialists and misses appointments, we will refer her to community care coordinators  9. Hypothyroidism due to medication  - levothyroxine (SYNTHROID) 75 MCG tablet; Take 1 tablet (75 mcg total) by mouth daily before breakfast.  Dispense: 30 tablet; Refill: 3  10. Essential hypertension  - losartan (COZAAR) 50 MG tablet; Take 1 tablet (50 mg total) by mouth daily.  Dispense: 30 tablet; Refill: 3  11. Tobacco dependence   12. Obstructive sleep apnea syndrome   13. Toothache  Needs to see dentist   14. Sialadenitis   Does not seem infected at this time, advised lemon drops , fluids

## 2021-10-26 ENCOUNTER — Telehealth: Payer: Self-pay | Admitting: *Deleted

## 2021-10-26 NOTE — Chronic Care Management (AMB) (Signed)
  Chronic Care Management   Note  10/26/2021 Name: April Luna MRN: [id number] DOB: 1971/01/31  April Luna is a 50 y.o. year old female who is a primary care patient of Steele Sizer, MD. I reached out to [employer] by phone today in response to a referral sent by April Luna's PCP.  April Luna was given information about Chronic Care Management services today including:  CCM service includes personalized support from designated clinical staff supervised by her physician, including individualized plan of care and coordination with other care providers 24/7 contact phone numbers for assistance for urgent and routine care needs. Service will only be billed when office clinical staff spend 20 minutes or more in a month to coordinate care. Only one practitioner may furnish and bill the service in a calendar month. The patient may stop CCM services at any time (effective at the end of the month) by phone call to the office staff. The patient is responsible for co-pay (up to 20% after annual deductible is met) if co-pay is required by the individual health plan.   Patient agreed to services and verbal consent obtained.   Follow up plan: Telephone appointment with care management team member scheduled for: 11/07/2021  Julian Hy, [place] Management  Direct Dial: [phone]

## 2021-10-27 ENCOUNTER — Ambulatory Visit: Payer: Self-pay | Admitting: *Deleted

## 2021-10-27 NOTE — Telephone Encounter (Signed)
Patient has clogged salivary glands and asking if Dr. Ancil Boozer has any other advice to help unglog. She is currently using sour candy. Suggested lemon slices with lots of water. No fever no pain. Routing to office for any further advice.       Reason for Disposition  Health Information question, no triage required and triager able to answer question  Answer Assessment - Initial Assessment Questions 1. REASON FOR CALL or QUESTION: "What is your reason for calling today?" or "How can I best help you?" or "What question do you have that I can help answer?"     Asking if there is other treatment for clogged salivary glands.  Protocols used: Information Only Call - No Triage-A-AH

## 2021-11-02 ENCOUNTER — Telehealth: Payer: Self-pay | Admitting: *Deleted

## 2021-11-02 NOTE — Chronic Care Management (AMB) (Signed)
  Care Management   Note  11/02/2021 Name: April Luna MRN: [id number] DOB: 1971-12-21  April Luna is a 50 y.o. year old female who is a primary care patient of Steele Sizer, MD and is actively engaged with the care management team. I reached out to April Luna by phone today to assist with re-scheduling an initial visit with the Licensed Clinical Social Worker  Follow up plan: Unsuccessful telephone outreach attempt made. A HIPAA compliant phone message was left for the patient providing contact information and requesting a return call. The care management team will reach out to the patient again over the next 7 days.  If patient returns call to provider office, please advise to call [place] at [phone].  [place] Management  Direct Dial: [phone]

## 2021-11-03 ENCOUNTER — Ambulatory Visit
Admission: RE | Admit: 2021-11-03 | Discharge: 2021-11-03 | Disposition: A | Discharge status: Home / Self Care | Payer: Medicare Other | Source: Ambulatory Visit | Attending: Family Medicine | Admitting: Family Medicine

## 2021-11-03 ENCOUNTER — Other Ambulatory Visit: Payer: Self-pay

## 2021-11-03 ENCOUNTER — Other Ambulatory Visit: Payer: Self-pay | Admitting: Family Medicine

## 2021-11-03 DIAGNOSIS — Z1231 Encounter for screening mammogram for malignant neoplasm of breast: Secondary | ICD-10-CM | POA: Insufficient documentation

## 2021-11-03 DIAGNOSIS — N6320 Unspecified lump in the left breast, unspecified quadrant: Secondary | ICD-10-CM

## 2021-11-03 DIAGNOSIS — N6452 Nipple discharge: Secondary | ICD-10-CM | POA: Diagnosis not present

## 2021-11-03 DIAGNOSIS — R922 Inconclusive mammogram: Secondary | ICD-10-CM | POA: Diagnosis not present

## 2021-11-03 DIAGNOSIS — R928 Other abnormal and inconclusive findings on diagnostic imaging of breast: Secondary | ICD-10-CM

## 2021-11-07 ENCOUNTER — Telehealth: Payer: Medicare Other

## 2021-11-07 NOTE — Chronic Care Management (AMB) (Signed)
  Care Management   Note  11/07/2021 Name: April Luna MRN: [id number] DOB: Jan 24, 1971  April Luna is a 50 y.o. year old female who is a primary care patient of Steele Sizer, MD and is actively engaged with the care management team. I reached out to April Luna by phone today to assist with re-scheduling an initial visit with the Licensed Clinical Social Worker  Follow up plan: 2nd Unsuccessful telephone outreach attempt made. A HIPAA compliant phone message was left for the patient providing contact information and requesting a return call.   Julian Hy, [place] Management  Direct Dial: [phone]

## 2021-11-08 NOTE — Chronic Care Management (AMB) (Signed)
  Care Management   Note  11/08/2021 Name: [name] MRN: [id number] DOB: 04-26-1971  April Luna is a 50 y.o. year old female who is a primary care patient of Steele Sizer, MD and is actively engaged with the care management team. I reached out to April Luna by phone today to assist with re-scheduling an initial visit with the Licensed Clinical Social Worker  Follow up plan: Telephone appointment with care management team member scheduled for:11/11/21  [place] Management  Direct Dial: [phone]

## 2021-11-11 ENCOUNTER — Telehealth: Payer: Medicare Other | Admitting: *Deleted

## 2021-11-11 ENCOUNTER — Telehealth: Payer: Self-pay | Admitting: *Deleted

## 2021-11-11 NOTE — Telephone Encounter (Signed)
  Care Management   Follow Up Note   11/11/2021 Name: April Luna MRN: [id number] DOB: Oct 22, 1971   Referred by: Steele Sizer, MD Reason for referral : No chief complaint on file.   An unsuccessful telephone outreach was attempted today. The patient was referred to the case management team for assistance with care management and care coordination.   Follow Up Plan: Telephone follow up appointment with care management team member to be re-scheduled by [place], [place] Worker  [facility] Care Management [phone]

## 2021-11-14 ENCOUNTER — Telehealth: Payer: Self-pay | Admitting: *Deleted

## 2021-11-14 NOTE — Chronic Care Management (AMB) (Signed)
  Care Management   Note  11/14/2021 Name: April Luna MRN: [id number] DOB: Sep 02, 1971  April Luna is a 50 y.o. year old female who is a primary care patient of Steele Sizer, MD and is actively engaged with the care management team. I reached out to April Luna by phone today to assist with re-scheduling an initial visit with the Licensed Clinical Social Worker  Follow up plan: Unsuccessful telephone outreach attempt made. A HIPAA compliant phone message was left for the patient providing contact information and requesting a return call.   Julian Hy, [place] Management  Direct Dial: [phone]

## 2021-11-16 NOTE — Chronic Care Management (AMB) (Signed)
  Care Management   Note  11/16/2021 Name: April Luna MRN: [id number] DOB: [birth year]  April Luna is a 50 y.o. year old female who is a primary care patient of Steele Sizer, MD and is actively engaged with the care management team. I reached out to April Luna by phone today to assist with re-scheduling an initial visit with the Licensed Clinical Social Worker  Follow up plan: 2nd Unsuccessful telephone outreach attempt made. A HIPAA compliant phone message was left for the patient providing contact information and requesting a return call.   Julian Hy, [place] Management  Direct Dial: [phone]

## 2021-11-16 NOTE — Chronic Care Management (AMB) (Signed)
  Care Management   Note  11/16/2021 Name: April Luna MRN: [id number] DOB: 1971/03/30  April Luna is a 50 y.o. year old female who is a primary care patient of Steele Sizer, MD and is actively engaged with the care management team. I reached out to April Luna by phone today to assist with re-scheduling an initial visit with the Licensed Clinical Social Worker  Follow up plan: Telephone appointment with care management team member scheduled for: 12/02/2021  Julian Hy, [place], [place] Management  Direct Dial: [phone]

## 2021-11-25 ENCOUNTER — Ambulatory Visit: Payer: Medicare Other | Admitting: Gastroenterology

## 2021-12-02 ENCOUNTER — Ambulatory Visit: Payer: Medicare Other | Admitting: *Deleted

## 2021-12-02 DIAGNOSIS — F3110 Bipolar disorder, current episode manic without psychotic features, unspecified: Secondary | ICD-10-CM

## 2021-12-02 DIAGNOSIS — E1129 Type 2 diabetes mellitus with other diabetic kidney complication: Secondary | ICD-10-CM

## 2021-12-02 DIAGNOSIS — I10 Essential (primary) hypertension: Secondary | ICD-10-CM

## 2021-12-03 NOTE — Chronic Care Management (AMB) (Signed)
Chronic Care Management    Clinical Social Work Note  12/03/2021 Name: April Luna MRN: [id number] DOB: 1971-08-06  April Luna is a 50 y.o. year old female who is a primary care patient of Steele Sizer, MD. The CCM team was consulted to assist the patient with chronic disease management and/or care coordination needs related to: [employer] .   Engaged with patient by telephone for initial visit in response to provider referral for social work chronic care management and care coordination services.   Consent to Services:  The patient was given the following information about Chronic Care Management services today, agreed to services, and gave verbal consent: 1. CCM service includes personalized support from designated clinical staff supervised by the primary care provider, including individualized plan of care and coordination with other care providers 2. 24/7 contact phone numbers for assistance for urgent and routine care needs. 3. Service will only be billed when office clinical staff spend 20 minutes or more in a month to coordinate care. 4. Only one practitioner may furnish and bill the service in a calendar month. 5.The patient may stop CCM services at any time (effective at the end of the month) by phone call to the office staff. 6. The patient will be responsible for cost sharing (co-pay) of up to 20% of the service fee (after annual deductible is met). Patient agreed to services and consent obtained.  Patient agreed to services and consent obtained.   Assessment: Review of patient past medical history, allergies, medications, and health status, including review of relevant consultants reports was performed today as part of a comprehensive evaluation and provision of chronic care management and care coordination services.     SDOH (Social Determinants of Health) assessments and interventions performed:    Advanced Directives Status: Not addressed in this  encounter.  CCM Care Plan  Allergies  Allergen Reactions   Metformin And Related Nausea And Vomiting   Penicillins Other (See Comments)    She has taken amoxicillin without problems   Perphenazine Other (See Comments)    Tremors, muscle weakness, tongue swelling   Sulfa Antibiotics Other (See Comments)    GI distress    Abilify [Aripiprazole] Rash    Outpatient Encounter Medications as of 12/02/2021  Medication Sig Note   albuterol (PROVENTIL) (2.5 MG/3ML) 0.083% nebulizer solution Take 3 mLs (2.5 mg total) by nebulization every 6 (six) hours as needed for wheezing or shortness of breath.    amantadine (SYMMETREL) 100 MG capsule Take 100 mg by mouth 2 (two) times daily.    blood glucose meter kit and supplies Dispense based on patient and insurance preference. Use up to four times daily as directed. (FOR ICD-10 E10.9, E11.9).    Budeson-Glycopyrrol-Formoterol (BREZTRI AEROSPHERE) 160-9-4.8 MCG/ACT AERO Inhale 2 puffs into the lungs in the morning and at bedtime.    buPROPion (WELLBUTRIN XL) 150 MG 24 hr tablet Take 150 mg by mouth daily.    Cholecalciferol (VITAMIN D-3) 125 MCG (5000 UT) TABS Take 5,000 Units by mouth daily.    gabapentin (NEURONTIN) 300 MG capsule Take 300 mg by mouth 2 (two) times daily.    hydrOXYzine (VISTARIL) 50 MG capsule Take 50 mg by mouth 2 (two) times daily as needed for anxiety.    icosapent Ethyl (VASCEPA) 1 g capsule Take 2 capsules (2 g total) by mouth 2 (two) times daily.    INVEGA SUSTENNA 234 MG/1.5ML SUSY injection Inject 1.5 mLs into the muscle every 30 (thirty) days. Inject as  directed once a month    levothyroxine (SYNTHROID) 75 MCG tablet Take 1 tablet (75 mcg total) by mouth daily before breakfast.    loratadine (CLARITIN) 10 MG tablet Take 10 mg by mouth daily.    losartan (COZAAR) 50 MG tablet Take 1 tablet (50 mg total) by mouth daily.    lubiprostone (AMITIZA) 24 MCG capsule TAKE 1 CAPSULE BY MOUTH TWICE A DAY WITHA MEAL    nicotine  (NICOTROL) 10 MG inhaler Inhale 1 Cartridge (1 continuous puffing total) into the lungs as needed for smoking cessation. 07/07/2021: Pt plans to start using to help quit smoking    pantoprazole (PROTONIX) 40 MG tablet Take 1 tablet (40 mg total) by mouth daily.    rosuvastatin (CRESTOR) 40 MG tablet Take 1 tablet (40 mg total) by mouth daily.    sertraline (ZOLOFT) 100 MG tablet Take 200 mg by mouth daily.    No facility-administered encounter medications on file as of 12/02/2021.    Patient Active Problem List   Diagnosis Date Noted   Sleep apnea 08/11/2021   Plantar callus 10/06/2019   Acquired hallux limitus of both feet 10/06/2019   Type 2 diabetes mellitus with microalbuminuria, without long-term current use of insulin ([place]) 06/24/2019   Chronic obstructive pulmonary disease ([place]) 06/24/2019   Chronic constipation 06/24/2019   ASCUS of cervix with negative high risk HPV 09/05/2018   GERD without esophagitis 04/11/2018   Hyperlipidemia 04/11/2018   Hypertension 04/11/2018   Hypothyroidism 04/26/2015   Bipolar I disorder, most recent episode (or current) manic ([place]) 04/25/2015   Delirium due to another medical condition 04/25/2015   Cannabis abuse 04/25/2015    Conditions to be addressed/monitored: HTN, DMII, and Bipolar Disorder; Mental Health Concerns  and difficulty with medical specialist follow up  Care Plan : General Social Work (Adult)  Updates made by April Claude, LCSW since 12/03/2021 12:00 AM     Problem: CHL AMB "PATIENT-SPECIFIC PROBLEM"   Note:   CARE PLAN ENTRY (see longitudinal plan of care for additional care plan information)  Current Barriers:  Patient with HTN, DM, and Bipolar Disorder in need of assistance with connection to community resources-patient states that she get's overwhelmed with the coordination of appointments and has a hard time following through Knowledge deficits and need for support, education and care coordination related to  community resources support  Patient having difficulty keeping specialist appointments  Clinical Goal(s)  Over the next  90  days patient will be able to schedule and keep medical appointments as demonstrated by increased ability to schedule both the upcoming appointments and arranging transportation to get there. Over the next 90 days, patient will continue to work with the ACT Team (community agency) to maintain adherence to medical appointments and medication management   Interventions provided by LCSW:  Assessed patient's care coordination needs related to specialist needs and discussed ongoing care management follow up  Confirmed that patient is active with the ACT Team who can assist with her transportation needs and specialist follow up as well. Patient confirmed that she also has access to para-transit as well for transportation there. Discussed specialist needs-assisted patient with additional follow up for breast biospy-[facility] contacted, VM left a message for a return call to schedule. Advised patient to obtain a calendar to organize appointments and mode of transportation Motivational Interviewing employed PHQ2/ PHQ9 completed Active listening / Reflection utilized  Emotional Support Provided   Patient Self Care Activities & Deficits:  Patient is unable to  independently navigate community resource options without care coordination support  Acknowledges deficits and is motivated to resolve concern  Patient is able to contact her Act Team  as discussed today for additional assistance with coordinating specialist appointments Unable to perform IADLs independently Performs ADL's independently Motivation for treatment  Initial goal documentation        Follow Up Plan: SW will follow up with patient by phone over the next 14 business days      [place], [place] Worker  [facility] Care Management [phone]

## 2021-12-03 NOTE — Patient Instructions (Signed)
Visit Information  Thank you for taking time to visit with me today. Please don't hesitate to contact me if I can be of assistance to you before our next scheduled telephone appointment.  Following are the goals we discussed today:  - arrange a ride through an agency 1 week before appointment - call to cancel if needed - keep a calendar with appointment dates  Our next appointment is by telephone on 12/05/21 at 11am  Please call the care guide team at [phone] if you need to cancel or reschedule your appointment.   If you are experiencing a Mental Health or [place] or need someone to talk to, please call the Suicide and Crisis Lifeline: 17   Following is a copy of your full plan of care:  Care Plan : General Social Work (Adult)  Updates made by [employer], Darla Lesches, LCSW since 12/03/2021 12:00 AM     Problem: CHL AMB "PATIENT-SPECIFIC PROBLEM"   Note:   CARE PLAN ENTRY (see longitudinal plan of care for additional care plan information)  Current Barriers:  Patient with HTN, DM, and Bipolar Disorder in need of assistance with connection to community resources-patient states that she get's overwhelmed with the coordination of appointments and has a hard time following through Knowledge deficits and need for support, education and care coordination related to community resources support  Patient having difficulty keeping specialist appointments  Clinical Goal(s)  Over the next  90  days patient will be able to schedule and keep medical appointments as demonstrated by increased ability to schedule both the upcoming appointments and arranging transportation to get there. Over the next 90 days, patient will continue to work with the ACT Team (community agency) to maintain adherence to medical appointments and medication management   Interventions provided by LCSW:  Assessed patient's care coordination needs related to specialist needs and discussed ongoing care  management follow up  Confirmed that patient is active with the ACT Team who can assist with her transportation needs and specialist follow up as well. Patient confirmed that she also has access to para-transit as well for transportation there. Discussed specialist needs-assisted patient with additional follow up for breast biospy-[facility] contacted, VM left a message for a return call to schedule. Advised patient to obtain a calendar to organize appointments and mode of transportation Motivational Interviewing employed PHQ2/ PHQ9 completed Active listening / Reflection utilized  Emotional Support Provided   Patient Self Care Activities & Deficits:  Patient is unable to independently navigate community resource options without care coordination support  Acknowledges deficits and is motivated to resolve concern  Patient is able to contact her Act Team  as discussed today for additional assistance with coordinating specialist appointments Unable to perform IADLs independently Performs ADL's independently Motivation for treatment  Initial goal documentation       April Luna was given information about Care Management services by the embedded care coordination team including:  Care Management services include personalized support from designated clinical staff supervised by her physician, including individualized plan of care and coordination with other care providers 24/7 contact phone numbers for assistance for urgent and routine care needs. The patient may stop CCM services at any time (effective at the end of the month) by phone call to the office staff.  Patient agreed to services and verbal consent obtained.   The patient verbalized understanding of instructions, educational materials, and care plan provided today and declined offer to receive copy of patient instructions, educational materials,  and care plan.   Telephone follow up appointment with care management  team member scheduled for: 12/05/21 11am  Charee Tumblin, [place] Worker  [facility] Care Management [phone]

## 2021-12-05 ENCOUNTER — Telehealth: Payer: Self-pay | Admitting: *Deleted

## 2021-12-05 ENCOUNTER — Ambulatory Visit: Payer: Medicare Other | Admitting: *Deleted

## 2021-12-05 DIAGNOSIS — F3110 Bipolar disorder, current episode manic without psychotic features, unspecified: Secondary | ICD-10-CM

## 2021-12-05 DIAGNOSIS — I10 Essential (primary) hypertension: Secondary | ICD-10-CM

## 2021-12-05 DIAGNOSIS — E1129 Type 2 diabetes mellitus with other diabetic kidney complication: Secondary | ICD-10-CM

## 2021-12-05 NOTE — Telephone Encounter (Signed)
  Care Management   Follow Up Note   12/05/2021 Name: April Luna MRN: [id number] DOB: 1971/06/03   Referred by: Steele Sizer, MD Reason for referral : No chief complaint on file.   An unsuccessful telephone outreach was attempted today. The patient was referred to the case management team for assistance with care management and care coordination.   Follow Up Plan: Telephone follow up appointment with care management team member scheduled for: 12/12/21  Elliot Gurney, [place] Worker  [facility] Care Management [phone]

## 2021-12-05 NOTE — Chronic Care Management (AMB) (Signed)
Chronic Care Management    Clinical Social Work Note  12/05/2021 Name: April Luna MRN: [id number] DOB: 07-10-1971  April Luna is a 50 y.o. year old female who is a primary care patient of Steele Sizer, MD. The CCM team was consulted to assist the patient with chronic disease management and/or care coordination needs related to: [employer] .   Engaged with patient by telephone for follow up visit in response to provider referral for social work chronic care management and care coordination services.   Consent to Services:  The patient was given information about Chronic Care Management services, agreed to services, and gave verbal consent prior to initiation of services.  Please see initial visit note for detailed documentation.   Patient agreed to services and consent obtained.   Assessment: Review of patient past medical history, allergies, medications, and health status, including review of relevant consultants reports was performed today as part of a comprehensive evaluation and provision of chronic care management and care coordination services.     SDOH (Social Determinants of Health) assessments and interventions performed:    Advanced Directives Status: Not addressed in this encounter.  CCM Care Plan  Allergies  Allergen Reactions   Metformin And Related Nausea And Vomiting   Penicillins Other (See Comments)    She has taken amoxicillin without problems   Perphenazine Other (See Comments)    Tremors, muscle weakness, tongue swelling   Sulfa Antibiotics Other (See Comments)    GI distress    Abilify [Aripiprazole] Rash    Outpatient Encounter Medications as of 12/05/2021  Medication Sig Note   albuterol (PROVENTIL) (2.5 MG/3ML) 0.083% nebulizer solution Take 3 mLs (2.5 mg total) by nebulization every 6 (six) hours as needed for wheezing or shortness of breath.    amantadine (SYMMETREL) 100 MG capsule Take 100 mg by mouth 2 (two) times daily.     blood glucose meter kit and supplies Dispense based on patient and insurance preference. Use up to four times daily as directed. (FOR ICD-10 E10.9, E11.9).    Budeson-Glycopyrrol-Formoterol (BREZTRI AEROSPHERE) 160-9-4.8 MCG/ACT AERO Inhale 2 puffs into the lungs in the morning and at bedtime.    buPROPion (WELLBUTRIN XL) 150 MG 24 hr tablet Take 150 mg by mouth daily.    Cholecalciferol (VITAMIN D-3) 125 MCG (5000 UT) TABS Take 5,000 Units by mouth daily.    gabapentin (NEURONTIN) 300 MG capsule Take 300 mg by mouth 2 (two) times daily.    hydrOXYzine (VISTARIL) 50 MG capsule Take 50 mg by mouth 2 (two) times daily as needed for anxiety.    icosapent Ethyl (VASCEPA) 1 g capsule Take 2 capsules (2 g total) by mouth 2 (two) times daily.    INVEGA SUSTENNA 234 MG/1.5ML SUSY injection Inject 1.5 mLs into the muscle every 30 (thirty) days. Inject as directed once a month    levothyroxine (SYNTHROID) 75 MCG tablet Take 1 tablet (75 mcg total) by mouth daily before breakfast.    loratadine (CLARITIN) 10 MG tablet Take 10 mg by mouth daily.    losartan (COZAAR) 50 MG tablet Take 1 tablet (50 mg total) by mouth daily.    lubiprostone (AMITIZA) 24 MCG capsule TAKE 1 CAPSULE BY MOUTH TWICE A DAY WITHA MEAL    nicotine (NICOTROL) 10 MG inhaler Inhale 1 Cartridge (1 continuous puffing total) into the lungs as needed for smoking cessation. 07/07/2021: Pt plans to start using to help quit smoking    pantoprazole (PROTONIX) 40 MG tablet Take 1  tablet (40 mg total) by mouth daily.    rosuvastatin (CRESTOR) 40 MG tablet Take 1 tablet (40 mg total) by mouth daily.    sertraline (ZOLOFT) 100 MG tablet Take 200 mg by mouth daily.    No facility-administered encounter medications on file as of 12/05/2021.    Patient Active Problem List   Diagnosis Date Noted   Sleep apnea 08/11/2021   Plantar callus 10/06/2019   Acquired hallux limitus of both feet 10/06/2019   Type 2 diabetes mellitus with microalbuminuria,  without long-term current use of insulin ([place]) 06/24/2019   Chronic obstructive pulmonary disease ([place]) 06/24/2019   Chronic constipation 06/24/2019   ASCUS of cervix with negative high risk HPV 09/05/2018   GERD without esophagitis 04/11/2018   Hyperlipidemia 04/11/2018   Hypertension 04/11/2018   Hypothyroidism 04/26/2015   Bipolar I disorder, most recent episode (or current) manic ([place]) 04/25/2015   Delirium due to another medical condition 04/25/2015   Cannabis abuse 04/25/2015    Conditions to be addressed/monitored: HTN, DMII, and Bipolar Disorder; Mental Health Concerns   Care Plan : General Social Work (Adult)  Updates made by Vern Claude, LCSW since 12/05/2021 12:00 AM     Problem: CHL AMB "PATIENT-SPECIFIC PROBLEM"   Note:   CARE PLAN ENTRY (see longitudinal plan of care for additional care plan information)  Current Barriers:  Patient with HTN, DM, and Bipolar Disorder in need of assistance with connection to community resources-patient states that she get's overwhelmed with the coordination of appointments and has a hard time following through Knowledge deficits and need for support, education and care coordination related to community resources support  Patient having difficulty keeping specialist appointments  Clinical Goal(s)  Over the next  90  days patient will be able to schedule and keep medical appointments as demonstrated by increased ability to schedule both the upcoming appointments and arranging transportation to get there. Over the next 90 days, patient will continue to work with the ACT Team (community agency) to maintain adherence to medical appointments and medication management   Interventions provided by LCSW:  Assessed patient's care coordination needs related to specialist needs and discussed ongoing care management follow up  Confirmed that patient continues to be active with the ACT Team who can assist with her transportation needs and  specialist follow up as well. Patient confirmed that she also has access to para-transit as well for transportation there-per patient she had to turn in her car tags and is unable to drive her car Discussed specialist needs-assisted patient with additional follow up for breast biospy-[facility] contacted, appointment scheduled for 12/14/21 at 12:45pm CSW agreeable to assist with arranging transportation to appointment  VM message let with GI specialist to assist with scheduling as well Continued to advise patient to obtain a calendar to organize appointments and mode of transportation Motivational Interviewing continues to be employed Active listening / Reflection utilized  Engineer, petroleum Provided   Patient Self Care Activities & Deficits:  Patient is unable to independently navigate community resource options without care coordination support  Acknowledges deficits and is motivated to resolve concern  Patient is able to contact her Act Team  as discussed today for additional assistance with coordinating specialist appointments Unable to perform IADLs independently Performs ADL's independently Motivation for treatment  Initial goal documentation        Follow Up Plan: SW will follow up with patient by phone over the next 14 business days      [employer],  [place] Worker  [facility] Care Management [phone]

## 2021-12-05 NOTE — Patient Instructions (Signed)
Visit Information  Thank you for taking time to visit with me today. Please don't hesitate to contact me if I can be of assistance to you before our next scheduled telephone appointment.  Following are the goals we discussed today:  - arrange a ride through an agency 1 week before appointment - call to cancel if needed - keep a calendar with appointment dates  Our next appointment is by telephone on 12/09/21 at 9am  Please call the care guide team at [phone] if you need to cancel or reschedule your appointment.   If you are experiencing a Mental Health or [place] or need someone to talk to, please call the Suicide and Crisis Lifeline: 988   The patient verbalized understanding of instructions, educational materials, and care plan provided today and declined offer to receive copy of patient instructions, educational materials, and care plan.   Telephone follow up appointment with care management team member scheduled for:12/09/21  Elliot Gurney, [place] Worker  [facility] Care Management [phone]

## 2021-12-06 ENCOUNTER — Ambulatory Visit: Payer: Self-pay | Admitting: *Deleted

## 2021-12-06 DIAGNOSIS — E1129 Type 2 diabetes mellitus with other diabetic kidney complication: Secondary | ICD-10-CM

## 2021-12-06 DIAGNOSIS — I10 Essential (primary) hypertension: Secondary | ICD-10-CM

## 2021-12-06 DIAGNOSIS — F3110 Bipolar disorder, current episode manic without psychotic features, unspecified: Secondary | ICD-10-CM

## 2021-12-06 NOTE — Patient Instructions (Signed)
Visit Information  Thank you for taking time to visit with me today. Please don't hesitate to contact me if I can be of assistance to you before our next scheduled telephone appointment.  Following are the goals we discussed today:  - arrange a ride through an agency 1 week before appointment - call to cancel if needed - keep a calendar with appointment dates  Our next appointment is by telephone on 12/12/21 at 2pm  Please call the care guide team at [phone] if you need to cancel or reschedule your appointment.   If you are experiencing a Mental Health or [place] or need someone to talk to, please call the Suicide and Crisis Lifeline: 988   The patient verbalized understanding of instructions, educational materials, and care plan provided today and declined offer to receive copy of patient instructions, educational materials, and care plan.   Telephone follow up appointment with care management team member scheduled for:12/12/21  Elliot Gurney, [place] Worker  [facility] Care Management [phone]

## 2021-12-06 NOTE — Chronic Care Management (AMB) (Signed)
Chronic Care Management    Clinical Social Work Note  12/06/2021 Name: April Luna MRN: [id number] DOB: [birth year]  April Luna is a 50 y.o. year old female who is a primary care patient of Steele Sizer, MD. The CCM team was consulted to assist the patient with chronic disease management and/or care coordination needs related to: [employer] .   Engaged with patient by telephone and collaborated with [facility] transportation team for follow up visit and transportation coordination to the [facility] in response to provider referral for social work chronic care management and care coordination services.   Consent to Services:  The patient was given information about Chronic Care Management services, agreed to services, and gave verbal consent prior to initiation of services.  Please see initial visit note for detailed documentation.   Patient agreed to services and consent obtained.   Assessment: Review of patient past medical history, allergies, medications, and health status, including review of relevant consultants reports was performed today as part of a comprehensive evaluation and provision of chronic care management and care coordination services.     SDOH (Social Determinants of Health) assessments and interventions performed:    Advanced Directives Status: Not addressed in this encounter.  CCM Care Plan  Allergies  Allergen Reactions   Metformin And Related Nausea And Vomiting   Penicillins Other (See Comments)    She has taken amoxicillin without problems   Perphenazine Other (See Comments)    Tremors, muscle weakness, tongue swelling   Sulfa Antibiotics Other (See Comments)    GI distress    Abilify [Aripiprazole] Rash    Outpatient Encounter Medications as of 12/06/2021  Medication Sig Note   albuterol (PROVENTIL) (2.5 MG/3ML) 0.083% nebulizer solution Take 3 mLs (2.5 mg total) by nebulization every 6 (six) hours as needed for wheezing  or shortness of breath.    amantadine (SYMMETREL) 100 MG capsule Take 100 mg by mouth 2 (two) times daily.    blood glucose meter kit and supplies Dispense based on patient and insurance preference. Use up to four times daily as directed. (FOR ICD-10 E10.9, E11.9).    Budeson-Glycopyrrol-Formoterol (BREZTRI AEROSPHERE) 160-9-4.8 MCG/ACT AERO Inhale 2 puffs into the lungs in the morning and at bedtime.    buPROPion (WELLBUTRIN XL) 150 MG 24 hr tablet Take 150 mg by mouth daily.    Cholecalciferol (VITAMIN D-3) 125 MCG (5000 UT) TABS Take 5,000 Units by mouth daily.    gabapentin (NEURONTIN) 300 MG capsule Take 300 mg by mouth 2 (two) times daily.    hydrOXYzine (VISTARIL) 50 MG capsule Take 50 mg by mouth 2 (two) times daily as needed for anxiety.    icosapent Ethyl (VASCEPA) 1 g capsule Take 2 capsules (2 g total) by mouth 2 (two) times daily.    INVEGA SUSTENNA 234 MG/1.5ML SUSY injection Inject 1.5 mLs into the muscle every 30 (thirty) days. Inject as directed once a month    levothyroxine (SYNTHROID) 75 MCG tablet Take 1 tablet (75 mcg total) by mouth daily before breakfast.    loratadine (CLARITIN) 10 MG tablet Take 10 mg by mouth daily.    losartan (COZAAR) 50 MG tablet Take 1 tablet (50 mg total) by mouth daily.    lubiprostone (AMITIZA) 24 MCG capsule TAKE 1 CAPSULE BY MOUTH TWICE A DAY WITHA MEAL    nicotine (NICOTROL) 10 MG inhaler Inhale 1 Cartridge (1 continuous puffing total) into the lungs as needed for smoking cessation. 07/07/2021: Pt plans to start using  to help quit smoking    pantoprazole (PROTONIX) 40 MG tablet Take 1 tablet (40 mg total) by mouth daily.    rosuvastatin (CRESTOR) 40 MG tablet Take 1 tablet (40 mg total) by mouth daily.    sertraline (ZOLOFT) 100 MG tablet Take 200 mg by mouth daily.    No facility-administered encounter medications on file as of 12/06/2021.    Patient Active Problem List   Diagnosis Date Noted   Sleep apnea 08/11/2021   Plantar callus  10/06/2019   Acquired hallux limitus of both feet 10/06/2019   Type 2 diabetes mellitus with microalbuminuria, without long-term current use of insulin ([place]) 06/24/2019   Chronic obstructive pulmonary disease ([place]) 06/24/2019   Chronic constipation 06/24/2019   ASCUS of cervix with negative high risk HPV 09/05/2018   GERD without esophagitis 04/11/2018   Hyperlipidemia 04/11/2018   Hypertension 04/11/2018   Hypothyroidism 04/26/2015   Bipolar I disorder, most recent episode (or current) manic ([place]) 04/25/2015   Delirium due to another medical condition 04/25/2015   Cannabis abuse 04/25/2015    Conditions to be addressed/monitored: ;  HTN, DMII, and Bipolar Disorder; Mental Health Concerns  Care Plan : General Social Work (Adult)  Updates made by Vern Claude, LCSW since 12/06/2021 12:00 AM     Problem: CHL AMB "PATIENT-SPECIFIC PROBLEM"   Note:   CARE PLAN ENTRY (see longitudinal plan of care for additional care plan information)  Current Barriers:  Patient with HTN, DM, and Bipolar Disorder in need of assistance with connection to community resources-patient states that she get's overwhelmed with the coordination of appointments and has a hard time following through Knowledge deficits and need for support, education and care coordination related to community resources support  Patient having difficulty keeping specialist appointments  Clinical Goal(s)  Over the next  90  days patient will be able to schedule and keep medical appointments as demonstrated by increased ability to schedule both the upcoming appointments and arranging transportation to get there. Over the next 90 days, patient will continue to work with the ACT Team (community agency) to maintain adherence to medical appointments and medication management   Interventions provided by LCSW:  Assessed patient's care coordination needs related to specialist needs and discussed ongoing care management follow up   Arranged transportation for upcoming bioposy at the [facility] for 12/14/21 at 12:45pm. Pick up time 12pm patient is to call [phone] for pick up after appointment is completed reference number [id number] Patient contacted, transportation arrangements confirmed-patient agreeable to transportation arrangements made.   Patient Self Care Activities & Deficits:  Patient is unable to independently navigate community resource options without care coordination support  Acknowledges deficits and is motivated to resolve concern  Patient is able to contact her Act Team  as discussed today for additional assistance with coordinating specialist appointments Unable to perform IADLs independently Performs ADL's independently Motivation for treatment  Please see past updates related to this goal by clicking on the "Past Updates" button in the selected goal         Follow Up Plan: SW will follow up with patient by phone over the next 7 business days      [place], [place] Worker  [facility] [facility] Care Management [phone]

## 2021-12-09 ENCOUNTER — Telehealth: Payer: Medicare Other

## 2021-12-09 ENCOUNTER — Telehealth: Payer: Self-pay | Admitting: *Deleted

## 2021-12-09 NOTE — Telephone Encounter (Signed)
° °  12/09/2021  April Luna Oct 06, 1971 [id number]   Return phone call to [facility] to assist with coordinating follow up appointment and transportation. VM left requesting a return call.   Elliot Gurney, [place] Administrator, [facility] Care Management [phone]

## 2021-12-12 ENCOUNTER — Ambulatory Visit (INDEPENDENT_AMBULATORY_CARE_PROVIDER_SITE_OTHER): Payer: Medicare Other | Admitting: *Deleted

## 2021-12-12 DIAGNOSIS — I10 Essential (primary) hypertension: Secondary | ICD-10-CM

## 2021-12-12 DIAGNOSIS — R809 Proteinuria, unspecified: Secondary | ICD-10-CM

## 2021-12-12 DIAGNOSIS — F3110 Bipolar disorder, current episode manic without psychotic features, unspecified: Secondary | ICD-10-CM

## 2021-12-12 NOTE — Chronic Care Management (AMB) (Signed)
Chronic Care Management    Clinical Social Work Note  12/12/2021 Name: April Luna MRN: [id number] DOB: 24-Aug-1971  April Luna is a 50 y.o. year old female who is a primary care patient of Steele Sizer, MD. The CCM team was consulted to assist the patient with chronic disease management and/or care coordination needs related to: [employer] .   Engaged with patient by telephone for follow up visit in response to provider referral for social work chronic care management and care coordination services.   Consent to Services:  The patient was given information about Chronic Care Management services, agreed to services, and gave verbal consent prior to initiation of services.  Please see initial visit note for detailed documentation.   Patient agreed to services and consent obtained.   Assessment: Review of patient past medical history, allergies, medications, and health status, including review of relevant consultants reports was performed today as part of a comprehensive evaluation and provision of chronic care management and care coordination services.     SDOH (Social Determinants of Health) assessments and interventions performed:    Advanced Directives Status: Not addressed in this encounter.  CCM Care Plan  Allergies  Allergen Reactions   Metformin And Related Nausea And Vomiting   Penicillins Other (See Comments)    She has taken amoxicillin without problems   Perphenazine Other (See Comments)    Tremors, muscle weakness, tongue swelling   Sulfa Antibiotics Other (See Comments)    GI distress    Abilify [Aripiprazole] Rash    Outpatient Encounter Medications as of 12/12/2021  Medication Sig Note   albuterol (PROVENTIL) (2.5 MG/3ML) 0.083% nebulizer solution Take 3 mLs (2.5 mg total) by nebulization every 6 (six) hours as needed for wheezing or shortness of breath.    amantadine (SYMMETREL) 100 MG capsule Take 100 mg by mouth 2 (two) times daily.     blood glucose meter kit and supplies Dispense based on patient and insurance preference. Use up to four times daily as directed. (FOR ICD-10 E10.9, E11.9).    Budeson-Glycopyrrol-Formoterol (BREZTRI AEROSPHERE) 160-9-4.8 MCG/ACT AERO Inhale 2 puffs into the lungs in the morning and at bedtime.    buPROPion (WELLBUTRIN XL) 150 MG 24 hr tablet Take 150 mg by mouth daily.    Cholecalciferol (VITAMIN D-3) 125 MCG (5000 UT) TABS Take 5,000 Units by mouth daily.    gabapentin (NEURONTIN) 300 MG capsule Take 300 mg by mouth 2 (two) times daily.    hydrOXYzine (VISTARIL) 50 MG capsule Take 50 mg by mouth 2 (two) times daily as needed for anxiety.    icosapent Ethyl (VASCEPA) 1 g capsule Take 2 capsules (2 g total) by mouth 2 (two) times daily.    INVEGA SUSTENNA 234 MG/1.5ML SUSY injection Inject 1.5 mLs into the muscle every 30 (thirty) days. Inject as directed once a month    levothyroxine (SYNTHROID) 75 MCG tablet Take 1 tablet (75 mcg total) by mouth daily before breakfast.    loratadine (CLARITIN) 10 MG tablet Take 10 mg by mouth daily.    losartan (COZAAR) 50 MG tablet Take 1 tablet (50 mg total) by mouth daily.    lubiprostone (AMITIZA) 24 MCG capsule TAKE 1 CAPSULE BY MOUTH TWICE A DAY WITHA MEAL    nicotine (NICOTROL) 10 MG inhaler Inhale 1 Cartridge (1 continuous puffing total) into the lungs as needed for smoking cessation. 07/07/2021: Pt plans to start using to help quit smoking    pantoprazole (PROTONIX) 40 MG tablet Take 1  tablet (40 mg total) by mouth daily.    rosuvastatin (CRESTOR) 40 MG tablet Take 1 tablet (40 mg total) by mouth daily.    sertraline (ZOLOFT) 100 MG tablet Take 200 mg by mouth daily.    No facility-administered encounter medications on file as of 12/12/2021.    Patient Active Problem List   Diagnosis Date Noted   Sleep apnea 08/11/2021   Plantar callus 10/06/2019   Acquired hallux limitus of both feet 10/06/2019   Type 2 diabetes mellitus with microalbuminuria,  without long-term current use of insulin ([place]) 06/24/2019   Chronic obstructive pulmonary disease ([place]) 06/24/2019   Chronic constipation 06/24/2019   ASCUS of cervix with negative high risk HPV 09/05/2018   GERD without esophagitis 04/11/2018   Hyperlipidemia 04/11/2018   Hypertension 04/11/2018   Hypothyroidism 04/26/2015   Bipolar I disorder, most recent episode (or current) manic ([place]) 04/25/2015   Delirium due to another medical condition 04/25/2015   Cannabis abuse 04/25/2015    Conditions to be addressed/monitored:  HTN, DMII, and Bipolar Disorder; Mental Health Concerns  Care Plan : General Social Work (Adult)  Updates made by Vern Claude, LCSW since 12/12/2021 12:00 AM     Problem: CHL AMB "PATIENT-SPECIFIC PROBLEM"   Note:   CARE PLAN ENTRY (see longitudinal plan of care for additional care plan information)  Current Barriers:  Patient with HTN, DM, and Bipolar Disorder in need of assistance with connection to community resources-patient states that she get's overwhelmed with the coordination of appointments and has a hard time following through Knowledge deficits and need for support, education and care coordination related to community resources support  Patient having difficulty keeping specialist appointments  Clinical Goal(s)  Over the next  90  days patient will be able to schedule and keep medical appointments as demonstrated by increased ability to schedule both the upcoming appointments and arranging transportation to get there. Over the next 90 days, patient will continue to work with the ACT Team (community agency) to maintain adherence to medical appointments and medication management  Interventions provided by LCSW:  Assessed patient's care coordination needs related to specialist needs and discussed ongoing care management follow up  Patient reminded of  transportation arranged for upcoming bioposy at the [facility] for 12/14/21 at 12:45pm.  Pick up time 12pm patient is to call [phone] for pick up after appointment is completed reference number [id number] Gastroenterology appointment scheduled for 01/24/22 2:15pm [place], [place]  Patient contacted-transportation to be arranged closer to appointment Additional appointments with specialist to be scheduled following follow up with [facility] and [facility] Positive Reinforcement provided for motivation to follow up with needed specialist    Patient Self Care Activities & Deficits:  Patient is unable to independently navigate community resource options without care coordination support  Acknowledges deficits and is motivated to resolve concern  Patient is able to contact her Act Team  as discussed today for additional assistance with coordinating specialist appointments Unable to perform IADLs independently Performs ADL's independently Motivation for treatment  Please see past updates related to this goal by clicking on the "Past Updates" button in the selected goal         Follow Up Plan: Appointment scheduled for SW follow up with client by phone on:  01/02/22       Elliot Gurney, [place] Worker  [facility] Care Management [phone]

## 2021-12-12 NOTE — Patient Instructions (Signed)
Visit Information  Thank you for taking time to visit with me today. Please don't hesitate to contact me if I can be of assistance to you before our next scheduled telephone appointment.  Following are the goals we discussed today:   - arrange a ride through an agency 1 week before appointment - call to cancel if needed - keep a calendar with appointment dates  [facility]-12/14/21 12:45 pm-transportation arranged -[facility] 01/24/22 2:15 pm  Our next appointment is by telephone on 01/02/22 at 11am  Please call the care guide team at [phone] if you need to cancel or reschedule your appointment.   If you are experiencing a Mental Health or [place] or need someone to talk to, please call the Suicide and Crisis Lifeline: 988   The patient verbalized understanding of instructions, educational materials, and care plan provided today and declined offer to receive copy of patient instructions, educational materials, and care plan.   Telephone follow up appointment with care management team member scheduled for:01/02/22  Elliot Gurney, [place] Worker  [facility] Care Management [phone]

## 2021-12-14 ENCOUNTER — Encounter: Payer: Self-pay | Admitting: *Deleted

## 2021-12-14 ENCOUNTER — Ambulatory Visit
Admission: RE | Admit: 2021-12-14 | Discharge: 2021-12-14 | Disposition: A | Discharge status: Home / Self Care | Payer: Medicare Other | Source: Ambulatory Visit | Attending: Family Medicine | Admitting: Family Medicine

## 2021-12-14 ENCOUNTER — Ambulatory Visit: Payer: Self-pay | Admitting: *Deleted

## 2021-12-14 ENCOUNTER — Other Ambulatory Visit: Payer: Self-pay | Admitting: Family Medicine

## 2021-12-14 ENCOUNTER — Other Ambulatory Visit: Payer: Self-pay

## 2021-12-14 DIAGNOSIS — I10 Essential (primary) hypertension: Secondary | ICD-10-CM

## 2021-12-14 DIAGNOSIS — E1129 Type 2 diabetes mellitus with other diabetic kidney complication: Secondary | ICD-10-CM

## 2021-12-14 DIAGNOSIS — R928 Other abnormal and inconclusive findings on diagnostic imaging of breast: Secondary | ICD-10-CM | POA: Diagnosis not present

## 2021-12-14 DIAGNOSIS — F3110 Bipolar disorder, current episode manic without psychotic features, unspecified: Secondary | ICD-10-CM

## 2021-12-14 DIAGNOSIS — R6889 Other general symptoms and signs: Secondary | ICD-10-CM | POA: Diagnosis not present

## 2021-12-14 DIAGNOSIS — N6320 Unspecified lump in the left breast, unspecified quadrant: Secondary | ICD-10-CM

## 2021-12-14 DIAGNOSIS — N6342 Unspecified lump in left breast, subareolar: Secondary | ICD-10-CM | POA: Diagnosis not present

## 2021-12-14 DIAGNOSIS — N6452 Nipple discharge: Secondary | ICD-10-CM | POA: Diagnosis not present

## 2021-12-14 HISTORY — PX: BREAST BIOPSY: SHX20

## 2021-12-14 NOTE — Chronic Care Management (AMB) (Signed)
Chronic Care Management    Clinical Social Work Note  12/14/2021 Name: April Luna MRN: [id number] DOB: October 06, 1971  April Luna is a 50 y.o. year old female who is a primary care patient of Steele Sizer, MD. The CCM team was consulted to assist the patient with chronic disease management and/or care coordination needs related to: [employer] .   Engaged with patient by telephone for follow up visit in response to provider referral for social work chronic care management and care coordination services.   Consent to Services:  The patient was given information about Chronic Care Management services, agreed to services, and gave verbal consent prior to initiation of services.  Please see initial visit note for detailed documentation.   Patient agreed to services and consent obtained.   Assessment: Review of patient past medical history, allergies, medications, and health status, including review of relevant consultants reports was performed today as part of a comprehensive evaluation and provision of chronic care management and care coordination services.     SDOH (Social Determinants of Health) assessments and interventions performed:    Advanced Directives Status: Not addressed in this encounter.  CCM Care Plan  Allergies  Allergen Reactions   Metformin And Related Nausea And Vomiting   Penicillins Other (See Comments)    She has taken amoxicillin without problems   Perphenazine Other (See Comments)    Tremors, muscle weakness, tongue swelling   Sulfa Antibiotics Other (See Comments)    GI distress    Abilify [Aripiprazole] Rash    Outpatient Encounter Medications as of 12/14/2021  Medication Sig Note   albuterol (PROVENTIL) (2.5 MG/3ML) 0.083% nebulizer solution Take 3 mLs (2.5 mg total) by nebulization every 6 (six) hours as needed for wheezing or shortness of breath.    amantadine (SYMMETREL) 100 MG capsule Take 100 mg by mouth 2 (two) times daily.     blood glucose meter kit and supplies Dispense based on patient and insurance preference. Use up to four times daily as directed. (FOR ICD-10 E10.9, E11.9).    Budeson-Glycopyrrol-Formoterol (BREZTRI AEROSPHERE) 160-9-4.8 MCG/ACT AERO Inhale 2 puffs into the lungs in the morning and at bedtime.    buPROPion (WELLBUTRIN XL) 150 MG 24 hr tablet Take 150 mg by mouth daily.    Cholecalciferol (VITAMIN D-3) 125 MCG (5000 UT) TABS Take 5,000 Units by mouth daily.    gabapentin (NEURONTIN) 300 MG capsule Take 300 mg by mouth 2 (two) times daily.    hydrOXYzine (VISTARIL) 50 MG capsule Take 50 mg by mouth 2 (two) times daily as needed for anxiety.    icosapent Ethyl (VASCEPA) 1 g capsule Take 2 capsules (2 g total) by mouth 2 (two) times daily.    INVEGA SUSTENNA 234 MG/1.5ML SUSY injection Inject 1.5 mLs into the muscle every 30 (thirty) days. Inject as directed once a month    levothyroxine (SYNTHROID) 75 MCG tablet Take 1 tablet (75 mcg total) by mouth daily before breakfast.    loratadine (CLARITIN) 10 MG tablet Take 10 mg by mouth daily.    losartan (COZAAR) 50 MG tablet Take 1 tablet (50 mg total) by mouth daily.    lubiprostone (AMITIZA) 24 MCG capsule TAKE 1 CAPSULE BY MOUTH TWICE A DAY WITHA MEAL    nicotine (NICOTROL) 10 MG inhaler Inhale 1 Cartridge (1 continuous puffing total) into the lungs as needed for smoking cessation. 07/07/2021: Pt plans to start using to help quit smoking    pantoprazole (PROTONIX) 40 MG tablet Take 1  tablet (40 mg total) by mouth daily.    rosuvastatin (CRESTOR) 40 MG tablet Take 1 tablet (40 mg total) by mouth daily.    sertraline (ZOLOFT) 100 MG tablet Take 200 mg by mouth daily.    No facility-administered encounter medications on file as of 12/14/2021.    Patient Active Problem List   Diagnosis Date Noted   Sleep apnea 08/11/2021   Plantar callus 10/06/2019   Acquired hallux limitus of both feet 10/06/2019   Type 2 diabetes mellitus with microalbuminuria,  without long-term current use of insulin ([place]) 06/24/2019   Chronic obstructive pulmonary disease ([place]) 06/24/2019   Chronic constipation 06/24/2019   ASCUS of cervix with negative high risk HPV 09/05/2018   GERD without esophagitis 04/11/2018   Hyperlipidemia 04/11/2018   Hypertension 04/11/2018   Hypothyroidism 04/26/2015   Bipolar I disorder, most recent episode (or current) manic ([place]) 04/25/2015   Delirium due to another medical condition 04/25/2015   Cannabis abuse 04/25/2015    Conditions to be addressed/monitored: HTN, DMII, and Bipolar Disorder; Mental Health Concerns   Care Plan : General Social Work (Adult)  Updates made by Vern Claude, LCSW since 12/14/2021 12:00 AM     Problem: CHL AMB "PATIENT-SPECIFIC PROBLEM"   Note:   CARE PLAN ENTRY (see longitudinal plan of care for additional care plan information)  Current Barriers:  Patient with HTN, DM, and Bipolar Disorder in need of assistance with connection to community resources-patient states that she get's overwhelmed with the coordination of appointments and has a hard time following through Knowledge deficits and need for support, education and care coordination related to community resources support  Patient having difficulty keeping specialist appointments  Clinical Goal(s)  Over the next  90  days patient will be able to schedule and keep medical appointments as demonstrated by increased ability to schedule both the upcoming appointments and arranging transportation to get there. Over the next 90 days, patient will continue to work with the ACT Team (community agency) to maintain adherence to medical appointments and medication management  Interventions provided by LCSW:  Assessed patient's care coordination needs related to specialist needs and discussed ongoing care management follow up  Patient reminded of  transportation arranged for upcoming bioposy at the [facility] for 12/14/21 at 12:45pm.  Pick up time 12pm patient is to call [phone] for pick up after appointment is completed reference number [id number] Gastroenterology appointment scheduled for 01/24/22 2:15pm [place], [place]  Patient contacted-transportation to be arranged closer to appointment Additional appointments with specialist to be scheduled following follow up with [facility] Positive Reinforcement provided for motivation to follow up with needed specialist 12/14/21 Phone call from patient to confirm transportation for [facility] at 12:45pm. [facility] transportation contacted, transportation confirmed. Patient agreed to call [phone] for return ride home    Patient Self Care Activities & Deficits:  Patient is unable to independently navigate community resource options without care coordination support  Acknowledges deficits and is motivated to resolve concern  Patient is able to contact her Act Team  as discussed today for additional assistance with coordinating specialist appointments Unable to perform IADLs independently Performs ADL's independently Motivation for treatment  Please see past updates related to this goal by clicking on the "Past Updates" button in the selected goal         Follow Up Plan: SW will follow up with patient by phone over the next  14 business days      [employer], [place] [facility] Care Management [phone]

## 2021-12-14 NOTE — Progress Notes (Signed)
This encounter was created in error - please disregard.

## 2021-12-14 NOTE — Patient Instructions (Signed)
Visit Information  Thank you for taking time to visit with me today. Please don't hesitate to contact me if I can be of assistance to you before our next scheduled telephone appointment.  Following are the goals we discussed today:   - arrange a ride through an agency 1 week before appointment - call to cancel if needed - keep a calendar with appointment dates  [facility]-12/14/21 12:45 pm-transportation arranged -[facility] 01/24/22 2:15 pm    Our next appointment is by telephone on 01/02/22 at 11am  Please call the care guide team at [phone] if you need to cancel or reschedule your appointment.   If you are experiencing a Mental Health or [place] or need someone to talk to, please call the Suicide and Crisis Lifeline: 988   The patient verbalized understanding of instructions, educational materials, and care plan provided today and declined offer to receive copy of patient instructions, educational materials, and care plan.   Telephone follow up appointment with care management team member scheduled for:01/04/22  Elliot Gurney, [place] Worker  [facility] Care Management [phone]

## 2021-12-15 LAB — SURGICAL PATHOLOGY

## 2021-12-23 ENCOUNTER — Other Ambulatory Visit: Payer: Self-pay | Admitting: Family Medicine

## 2021-12-23 DIAGNOSIS — N6452 Nipple discharge: Secondary | ICD-10-CM

## 2021-12-24 DIAGNOSIS — R809 Proteinuria, unspecified: Secondary | ICD-10-CM

## 2021-12-24 DIAGNOSIS — E1129 Type 2 diabetes mellitus with other diabetic kidney complication: Secondary | ICD-10-CM

## 2021-12-24 DIAGNOSIS — I10 Essential (primary) hypertension: Secondary | ICD-10-CM

## 2022-01-02 ENCOUNTER — Ambulatory Visit (INDEPENDENT_AMBULATORY_CARE_PROVIDER_SITE_OTHER): Payer: Medicare Other | Admitting: *Deleted

## 2022-01-02 DIAGNOSIS — R809 Proteinuria, unspecified: Secondary | ICD-10-CM

## 2022-01-02 DIAGNOSIS — E1129 Type 2 diabetes mellitus with other diabetic kidney complication: Secondary | ICD-10-CM

## 2022-01-02 DIAGNOSIS — I10 Essential (primary) hypertension: Secondary | ICD-10-CM

## 2022-01-02 DIAGNOSIS — F3110 Bipolar disorder, current episode manic without psychotic features, unspecified: Secondary | ICD-10-CM

## 2022-01-02 NOTE — Patient Instructions (Signed)
Visit Information  Thank you for taking time to visit with me today. Please don't hesitate to contact me if I can be of assistance to you before our next scheduled telephone appointment.  Following are the goals we discussed today:   - arrange a ride through an agency 1 week before appointment - call to cancel if needed - keep a calendar with appointment dates  -Gastroenterology appointment scheduled for 01/24/22 2:15pm [place], [place] to be arranged within 1 week    Our next appointment is by telephone on 01/09/22 at 10am  Please call the care guide team at [phone] if you need to cancel or reschedule your appointment.   If you are experiencing a Mental Health or [place] or need someone to talk to, please call the Suicide and Crisis Lifeline: 988   The patient verbalized understanding of instructions, educational materials, and care plan provided today and declined offer to receive copy of patient instructions, educational materials, and care plan.   Telephone follow up appointment with care management team member scheduled for: 01/09/22  Elliot Gurney, [place] Worker  [facility] [phone]

## 2022-01-02 NOTE — Chronic Care Management (AMB) (Signed)
Chronic Care Management    Clinical Social Work Note  01/02/2022 Name: SHENEKIA RIESS MRN: [id number] DOB: October 22, 1971  DEWAYNE JUREK is a 51 y.o. year old female who is a primary care patient of Steele Sizer, MD. The CCM team was consulted to assist the patient with chronic disease management and/or care coordination needs related to: [employer] .   Engaged with patient by telephone for follow up visit in response to provider referral for social work chronic care management and care coordination services.   Consent to Services:  The patient was given information about Chronic Care Management services, agreed to services, and gave verbal consent prior to initiation of services.  Please see initial visit note for detailed documentation.   Patient agreed to services and consent obtained.   Assessment: Review of patient past medical history, allergies, medications, and health status, including review of relevant consultants reports was performed today as part of a comprehensive evaluation and provision of chronic care management and care coordination services.     SDOH (Social Determinants of Health) assessments and interventions performed:    Advanced Directives Status: Not addressed in this encounter.  CCM Care Plan  Allergies  Allergen Reactions   Metformin And Related Nausea And Vomiting   Penicillins Other (See Comments)    She has taken amoxicillin without problems   Perphenazine Other (See Comments)    Tremors, muscle weakness, tongue swelling   Sulfa Antibiotics Other (See Comments)    GI distress    Abilify [Aripiprazole] Rash    Outpatient Encounter Medications as of 01/02/2022  Medication Sig Note   albuterol (PROVENTIL) (2.5 MG/3ML) 0.083% nebulizer solution Take 3 mLs (2.5 mg total) by nebulization every 6 (six) hours as needed for wheezing or shortness of breath.    amantadine (SYMMETREL) 100 MG capsule Take 100 mg by mouth 2 (two) times daily.     blood glucose meter kit and supplies Dispense based on patient and insurance preference. Use up to four times daily as directed. (FOR ICD-10 E10.9, E11.9).    Budeson-Glycopyrrol-Formoterol (BREZTRI AEROSPHERE) 160-9-4.8 MCG/ACT AERO Inhale 2 puffs into the lungs in the morning and at bedtime.    buPROPion (WELLBUTRIN XL) 150 MG 24 hr tablet Take 150 mg by mouth daily.    Cholecalciferol (VITAMIN D-3) 125 MCG (5000 UT) TABS Take 5,000 Units by mouth daily.    gabapentin (NEURONTIN) 300 MG capsule Take 300 mg by mouth 2 (two) times daily.    hydrOXYzine (VISTARIL) 50 MG capsule Take 50 mg by mouth 2 (two) times daily as needed for anxiety.    icosapent Ethyl (VASCEPA) 1 g capsule Take 2 capsules (2 g total) by mouth 2 (two) times daily.    INVEGA SUSTENNA 234 MG/1.5ML SUSY injection Inject 1.5 mLs into the muscle every 30 (thirty) days. Inject as directed once a month    levothyroxine (SYNTHROID) 75 MCG tablet Take 1 tablet (75 mcg total) by mouth daily before breakfast.    loratadine (CLARITIN) 10 MG tablet Take 10 mg by mouth daily.    losartan (COZAAR) 50 MG tablet Take 1 tablet (50 mg total) by mouth daily.    lubiprostone (AMITIZA) 24 MCG capsule TAKE 1 CAPSULE BY MOUTH TWICE A DAY WITHA MEAL    nicotine (NICOTROL) 10 MG inhaler Inhale 1 Cartridge (1 continuous puffing total) into the lungs as needed for smoking cessation. 07/07/2021: Pt plans to start using to help quit smoking    pantoprazole (PROTONIX) 40 MG tablet Take 1  tablet (40 mg total) by mouth daily.    rosuvastatin (CRESTOR) 40 MG tablet Take 1 tablet (40 mg total) by mouth daily.    sertraline (ZOLOFT) 100 MG tablet Take 200 mg by mouth daily.    No facility-administered encounter medications on file as of 01/02/2022.    Patient Active Problem List   Diagnosis Date Noted   Sleep apnea 08/11/2021   Plantar callus 10/06/2019   Acquired hallux limitus of both feet 10/06/2019   Type 2 diabetes mellitus with microalbuminuria,  without long-term current use of insulin ([place]) 06/24/2019   Chronic obstructive pulmonary disease ([place]) 06/24/2019   Chronic constipation 06/24/2019   ASCUS of cervix with negative high risk HPV 09/05/2018   GERD without esophagitis 04/11/2018   Hyperlipidemia 04/11/2018   Hypertension 04/11/2018   Hypothyroidism 04/26/2015   Bipolar I disorder, most recent episode (or current) manic ([place]) 04/25/2015   Delirium due to another medical condition 04/25/2015   Cannabis abuse 04/25/2015    Conditions to be addressed/monitored: ;  HTN, DMII, and Bipolar Disorder; Mental Health Concerns  Care Plan : General Social Work (Adult)  Updates made by Vern Claude, LCSW since 01/02/2022 12:00 AM     Problem: CHL AMB "PATIENT-SPECIFIC PROBLEM"   Note:   CARE PLAN ENTRY (see longitudinal plan of care for additional care plan information)  Current Barriers:  Patient with HTN, DM, and Bipolar Disorder in need of assistance with connection to community resources-patient states that she get's overwhelmed with the coordination of appointments and has a hard time following through Knowledge deficits and need for support, education and care coordination related to community resources support  Patient having difficulty keeping specialist appointments  Clinical Goal(s)  Over the next  90  days patient will be able to schedule and keep medical appointments as demonstrated by increased ability to schedule both the upcoming appointments and arranging transportation to get there. Over the next 90 days, patient will continue to work with the ACT Team (community agency) to maintain adherence to medical appointments and medication management  Interventions provided by LCSW:  Assessed patient's care coordination needs related to specialist needs and discussed ongoing care management follow up  Appointment with [facility] completed on 12/14/21 at 12:45pm. [facility] arranged [phone] for pick up  -bilateral breast  MRI to follow Gastroenterology appointment scheduled for 01/24/22 2:15pm [place], [place]  Patient contacted-transportation to be arranged closer to appointment Additional appointments with specialist to be scheduled following follow up with [facility] and Gastroenterology Positive Reinforcement provided for motivation to follow up with needed specialist and continued follow up with the ACT Team for her mental health needs   Patient Self Care Activities & Deficits:  Patient is unable to independently navigate community resource options without care coordination support  Acknowledges deficits and is motivated to resolve concern  Patient is able to contact her Act Team  as discussed today for additional assistance with coordinating specialist appointments Unable to perform IADLs independently Performs ADL's independently Motivation for treatment  Please see past updates related to this goal by clicking on the "Past Updates" button in the selected goal         Follow Up Plan: SW will follow up with patient by phone over the next 14 business days      Elliot Gurney, [place] Worker  [facility] [phone]

## 2022-01-09 ENCOUNTER — Ambulatory Visit: Payer: Medicare Other | Admitting: *Deleted

## 2022-01-09 DIAGNOSIS — E1129 Type 2 diabetes mellitus with other diabetic kidney complication: Secondary | ICD-10-CM

## 2022-01-09 DIAGNOSIS — R809 Proteinuria, unspecified: Secondary | ICD-10-CM

## 2022-01-09 DIAGNOSIS — I10 Essential (primary) hypertension: Secondary | ICD-10-CM

## 2022-01-09 DIAGNOSIS — F3110 Bipolar disorder, current episode manic without psychotic features, unspecified: Secondary | ICD-10-CM

## 2022-01-09 NOTE — Patient Instructions (Signed)
Visit Information  Thank you for taking time to visit with me today. Please don't hesitate to contact me if I can be of assistance to you before our next scheduled telephone appointment.  Following are the goals we discussed today:   - arrange a ride through an agency 1 week before appointment - call to cancel if needed - keep a calendar with appointment dates  -Gastroenterology appointment scheduled for 01/24/22 2:15pm [place], [place] to be arranged within 1 week  Our next appointment is by telephone on 01/13/22 at 11am  Please call the care guide team at [phone] if you need to cancel or reschedule your appointment.   If you are experiencing a Mental Health or [place] or need someone to talk to, please call the Suicide and Crisis Lifeline: 988   The patient verbalized understanding of instructions, educational materials, and care plan provided today and declined offer to receive copy of patient instructions, educational materials, and care plan.   Telephone follow up appointment with care management team member scheduled for: 01/13/22  Elliot Gurney, [place] Worker  [facility] [phone]

## 2022-01-09 NOTE — Chronic Care Management (AMB) (Signed)
Chronic Care Management    Clinical Social Work Note  01/09/2022 Name: April Luna MRN: [id number] DOB: 1971/05/01  April Luna is a 51 y.o. year old female who is a primary care patient of Steele Sizer, MD. The CCM team was consulted to assist the patient with chronic disease management and/or care coordination needs related to: [employer] .   Engaged with patient by telephone for follow up visit in response to provider referral for social work chronic care management and care coordination services.   Consent to Services:  The patient was given information about Chronic Care Management services, agreed to services, and gave verbal consent prior to initiation of services.  Please see initial visit note for detailed documentation.   Patient agreed to services and consent obtained.   Assessment: Review of patient past medical history, allergies, medications, and health status, including review of relevant consultants reports was performed today as part of a comprehensive evaluation and provision of chronic care management and care coordination services.     SDOH (Social Determinants of Health) assessments and interventions performed:    Advanced Directives Status: Not addressed in this encounter.  CCM Care Plan  Allergies  Allergen Reactions   Metformin And Related Nausea And Vomiting   Penicillins Other (See Comments)    She has taken amoxicillin without problems   Perphenazine Other (See Comments)    Tremors, muscle weakness, tongue swelling   Sulfa Antibiotics Other (See Comments)    GI distress    Abilify [Aripiprazole] Rash    Outpatient Encounter Medications as of 01/09/2022  Medication Sig Note   albuterol (PROVENTIL) (2.5 MG/3ML) 0.083% nebulizer solution Take 3 mLs (2.5 mg total) by nebulization every 6 (six) hours as needed for wheezing or shortness of breath.    amantadine (SYMMETREL) 100 MG capsule Take 100 mg by mouth 2 (two) times daily.     blood glucose meter kit and supplies Dispense based on patient and insurance preference. Use up to four times daily as directed. (FOR ICD-10 E10.9, E11.9).    Budeson-Glycopyrrol-Formoterol (BREZTRI AEROSPHERE) 160-9-4.8 MCG/ACT AERO Inhale 2 puffs into the lungs in the morning and at bedtime.    buPROPion (WELLBUTRIN XL) 150 MG 24 hr tablet Take 150 mg by mouth daily.    Cholecalciferol (VITAMIN D-3) 125 MCG (5000 UT) TABS Take 5,000 Units by mouth daily.    gabapentin (NEURONTIN) 300 MG capsule Take 300 mg by mouth 2 (two) times daily.    hydrOXYzine (VISTARIL) 50 MG capsule Take 50 mg by mouth 2 (two) times daily as needed for anxiety.    icosapent Ethyl (VASCEPA) 1 g capsule Take 2 capsules (2 g total) by mouth 2 (two) times daily.    INVEGA SUSTENNA 234 MG/1.5ML SUSY injection Inject 1.5 mLs into the muscle every 30 (thirty) days. Inject as directed once a month    levothyroxine (SYNTHROID) 75 MCG tablet Take 1 tablet (75 mcg total) by mouth daily before breakfast.    loratadine (CLARITIN) 10 MG tablet Take 10 mg by mouth daily.    losartan (COZAAR) 50 MG tablet Take 1 tablet (50 mg total) by mouth daily.    lubiprostone (AMITIZA) 24 MCG capsule TAKE 1 CAPSULE BY MOUTH TWICE A DAY WITHA MEAL    nicotine (NICOTROL) 10 MG inhaler Inhale 1 Cartridge (1 continuous puffing total) into the lungs as needed for smoking cessation. 07/07/2021: Pt plans to start using to help quit smoking    pantoprazole (PROTONIX) 40 MG tablet Take  1 tablet (40 mg total) by mouth daily.    rosuvastatin (CRESTOR) 40 MG tablet Take 1 tablet (40 mg total) by mouth daily.    sertraline (ZOLOFT) 100 MG tablet Take 200 mg by mouth daily.    No facility-administered encounter medications on file as of 01/09/2022.    Patient Active Problem List   Diagnosis Date Noted   Sleep apnea 08/11/2021   Plantar callus 10/06/2019   Acquired hallux limitus of both feet 10/06/2019   Type 2 diabetes mellitus with microalbuminuria,  without long-term current use of insulin ([place]) 06/24/2019   Chronic obstructive pulmonary disease ([place]) 06/24/2019   Chronic constipation 06/24/2019   ASCUS of cervix with negative high risk HPV 09/05/2018   GERD without esophagitis 04/11/2018   Hyperlipidemia 04/11/2018   Hypertension 04/11/2018   Hypothyroidism 04/26/2015   Bipolar I disorder, most recent episode (or current) manic ([place]) 04/25/2015   Delirium due to another medical condition 04/25/2015   Cannabis abuse 04/25/2015    Conditions to be addressed/monitored: HTN, DMII, and Bipolar Disorder; Mental Health Concerns   Care Plan : General Social Work (Adult)  Updates made by Vern Claude, LCSW since 01/09/2022 12:00 AM     Problem: CHL AMB "PATIENT-SPECIFIC PROBLEM"   Note:   CARE PLAN ENTRY (see longitudinal plan of care for additional care plan information)  Current Barriers:  Patient with HTN, DM, and Bipolar Disorder in need of assistance with connection to community resources-patient states that she get's overwhelmed with the coordination of appointments and has a hard time following through Knowledge deficits and need for support, education and care coordination related to community resources support  Patient having difficulty keeping specialist appointments  Clinical Goal(s)  Over the next  90  days patient will be able to schedule and keep medical appointments as demonstrated by increased ability to schedule both the upcoming appointments and arranging transportation to get there. Over the next 90 days, patient will continue to work with the ACT Team (community agency) to maintain adherence to medical appointments and medication management  Interventions provided by LCSW:  Assessed patient's care coordination needs related to specialist needs and discussed ongoing care management follow up  Appointment now needed with [facility] for bilateral breast  [place] contacted to schedule transportation  could not be arranged [employer] did not [place] did not answer as well to schedule procedure Gastroenterology appointment scheduled for 01/24/22 2:15pm [place], [place] -transportation could not be made, [facility] did not answer Patient discussed needed for follow up with Urology due to pain in [place] was confirmed that patient's provider will need to send a referral Additional appointments with specialist to be scheduled following follow up with [place] and Gastroenterology Positive Reinforcement continues to be provided for motivation to follow up with needed specialist and continued follow up with the ACT Team for her mental health needs   Patient Self Care Activities & Deficits:  Patient is unable to independently navigate community resource options without care coordination support  Acknowledges deficits and is motivated to resolve concern  Patient is able to contact her Act Team  as discussed today for additional assistance with coordinating specialist appointments Unable to perform IADLs independently Performs ADL's independently Motivation for treatment  Please see past updates related to this goal by clicking on the "Past Updates" button in the selected goal         Follow Up Plan: SW will follow up with  by phone over the next 7-14 business days °     ° , LCSW °Clinical Social Worker  °[facility] °[phone] ° ° ° °

## 2022-01-13 ENCOUNTER — Ambulatory Visit: Payer: Medicare Other | Admitting: *Deleted

## 2022-01-13 DIAGNOSIS — R809 Proteinuria, unspecified: Secondary | ICD-10-CM

## 2022-01-13 DIAGNOSIS — E1129 Type 2 diabetes mellitus with other diabetic kidney complication: Secondary | ICD-10-CM

## 2022-01-13 DIAGNOSIS — I10 Essential (primary) hypertension: Secondary | ICD-10-CM

## 2022-01-13 DIAGNOSIS — F3110 Bipolar disorder, current episode manic without psychotic features, unspecified: Secondary | ICD-10-CM

## 2022-01-13 NOTE — Chronic Care Management (AMB) (Signed)
Chronic Care Management    Clinical Social Work Note  01/13/2022 Name: April Luna MRN: [id number] DOB: 10/27/1971  April Luna is a 51 y.o. year old female who is a primary care patient of Steele Sizer, MD. The CCM team was consulted to assist the patient with chronic disease management and/or care coordination needs related to: [employer] .   Engaged with patient by telephone as well as care advocate with [facility] for follow up visit in response to provider referral for social work chronic care management and care coordination services.   Consent to Services:  The patient was given information about Chronic Care Management services, agreed to services, and gave verbal consent prior to initiation of services.  Please see initial visit note for detailed documentation.   Patient agreed to services and consent obtained.   Assessment: Review of patient past medical history, allergies, medications, and health status, including review of relevant consultants reports was performed today as part of a comprehensive evaluation and provision of chronic care management and care coordination services.     SDOH (Social Determinants of Health) assessments and interventions performed:    Advanced Directives Status: Not addressed in this encounter.  CCM Care Plan  Allergies  Allergen Reactions   Metformin And Related Nausea And Vomiting   Penicillins Other (See Comments)    She has taken amoxicillin without problems   Perphenazine Other (See Comments)    Tremors, muscle weakness, tongue swelling   Sulfa Antibiotics Other (See Comments)    GI distress    Abilify [Aripiprazole] Rash    Outpatient Encounter Medications as of 01/13/2022  Medication Sig Note   albuterol (PROVENTIL) (2.5 MG/3ML) 0.083% nebulizer solution Take 3 mLs (2.5 mg total) by nebulization every 6 (six) hours as needed for wheezing or shortness of breath.    amantadine (SYMMETREL) 100 MG  capsule Take 100 mg by mouth 2 (two) times daily.    blood glucose meter kit and supplies Dispense based on patient and insurance preference. Use up to four times daily as directed. (FOR ICD-10 E10.9, E11.9).    Budeson-Glycopyrrol-Formoterol (BREZTRI AEROSPHERE) 160-9-4.8 MCG/ACT AERO Inhale 2 puffs into the lungs in the morning and at bedtime.    buPROPion (WELLBUTRIN XL) 150 MG 24 hr tablet Take 150 mg by mouth daily.    Cholecalciferol (VITAMIN D-3) 125 MCG (5000 UT) TABS Take 5,000 Units by mouth daily.    gabapentin (NEURONTIN) 300 MG capsule Take 300 mg by mouth 2 (two) times daily.    hydrOXYzine (VISTARIL) 50 MG capsule Take 50 mg by mouth 2 (two) times daily as needed for anxiety.    icosapent Ethyl (VASCEPA) 1 g capsule Take 2 capsules (2 g total) by mouth 2 (two) times daily.    INVEGA SUSTENNA 234 MG/1.5ML SUSY injection Inject 1.5 mLs into the muscle every 30 (thirty) days. Inject as directed once a month    levothyroxine (SYNTHROID) 75 MCG tablet Take 1 tablet (75 mcg total) by mouth daily before breakfast.    loratadine (CLARITIN) 10 MG tablet Take 10 mg by mouth daily.    losartan (COZAAR) 50 MG tablet Take 1 tablet (50 mg total) by mouth daily.    lubiprostone (AMITIZA) 24 MCG capsule TAKE 1 CAPSULE BY MOUTH TWICE A DAY WITHA MEAL    nicotine (NICOTROL) 10 MG inhaler Inhale 1 Cartridge (1 continuous puffing total) into the lungs as needed for smoking cessation. 07/07/2021: Pt plans to start using to help quit smoking  pantoprazole (PROTONIX) 40 MG tablet Take 1 tablet (40 mg total) by mouth daily.    rosuvastatin (CRESTOR) 40 MG tablet Take 1 tablet (40 mg total) by mouth daily.    sertraline (ZOLOFT) 100 MG tablet Take 200 mg by mouth daily.    No facility-administered encounter medications on file as of 01/13/2022.    Patient Active Problem List   Diagnosis Date Noted   Sleep apnea 08/11/2021   Plantar callus 10/06/2019   Acquired hallux limitus of both feet 10/06/2019    Type 2 diabetes mellitus with microalbuminuria, without long-term current use of insulin ([place]) 06/24/2019   Chronic obstructive pulmonary disease ([place]) 06/24/2019   Chronic constipation 06/24/2019   ASCUS of cervix with negative high risk HPV 09/05/2018   GERD without esophagitis 04/11/2018   Hyperlipidemia 04/11/2018   Hypertension 04/11/2018   Hypothyroidism 04/26/2015   Bipolar I disorder, most recent episode (or current) manic ([place]) 04/25/2015   Delirium due to another medical condition 04/25/2015   Cannabis abuse 04/25/2015    Conditions to be addressed/monitored:  HTN, DMII, and Bipolar Disorder; Mental Health Concerns  Care Plan : General Social Work (Adult)  Updates made by Vern Claude, LCSW since 01/13/2022 12:00 AM     Problem: CHL AMB "PATIENT-SPECIFIC PROBLEM"   Note:   CARE PLAN ENTRY (see longitudinal plan of care for additional care plan information)  Current Barriers:  Patient with HTN, DM, and Bipolar Disorder in need of assistance with connection to community resources-patient states that she get's overwhelmed with the coordination of appointments and has a hard time following through Knowledge deficits and need for support, education and care coordination related to community resources support  Patient having difficulty keeping specialist appointments  Clinical Goal(s)  Over the next  90  days patient will be able to schedule and keep medical appointments as demonstrated by increased ability to schedule both the upcoming appointments and arranging transportation to get there. Over the next 90 days, patient will continue to work with the ACT Team (community agency) to maintain adherence to medical appointments and medication management  Interventions provided by LCSW:  Assessed patient's care coordination needs related to specialist needs and discussed ongoing care management follow up  Appointment now needed with [facility] for bilateral breast   [place] contacted to schedule transportation could not be arranged [employer] did not [place] did not answer as well to schedule procedure Gastroenterology appointment scheduled for 01/24/22 2:15pm [place], [place] -transportation could not be made, [facility] did not answer Patient discussed needed for follow up with Urology due to pain in [place] was confirmed that patient's provider will need to send a referral Additional appointments with specialist to be scheduled following follow up with [facility] and Gastroenterology Positive Reinforcement continues to be provided for motivation to follow up with needed specialist and continued follow up with the ACT Team for her mental health needs 01/13/22 Phone call to [facility] to arrange transportation to patient's appointment with Gastroenterology. This Education officer, museum collaborated with care advocate for over 45 minutes, arrangements made however per care advocate, patient would have to pay $32.00 for a Lyft or [place], no cost if she went by non-emergent ambulance Patient  contacted, she is not able to pay this amount for the ride and cost to see the specialist. [employer] representative Amy Alyse Low contacted who suggested that this social worker send a email to Jearld Pies to request a member  escalation email sent to [email or web address]   Patient Self Care Activities & Deficits:  Patient is unable to independently navigate community resource options without care coordination support  Acknowledges deficits and is motivated to resolve concern  Patient is able to contact her Act Team  as discussed today for additional assistance with coordinating specialist appointments Unable to perform IADLs independently Performs ADL's independently Motivation for treatment  Please see past updates related to this goal by clicking on the "Past Updates" button in the selected goal         Follow  Up Plan: SW will follow up with patient by phone over the next 7-14 business days      [place], [place] Worker  [facility] Care Management [phone]

## 2022-01-13 NOTE — Patient Instructions (Signed)
Visit Information  Thank you for taking time to visit with me today. Please don't hesitate to contact me if I can be of assistance to you before our next scheduled telephone appointment.  Following are the goals we discussed today:   - arrange a ride through an agency 1 week before appointment - call to cancel if needed - keep a calendar with appointment dates  -Gastroenterology appointment scheduled for 01/24/22 2:15pm [place], [place] to be arranged within 1 week  Our next appointment is by telephone on 01/16/21 at 9am  Please call the care guide team at [phone] if you need to cancel or reschedule your appointment.   If you are experiencing a Mental Health or [place] or need someone to talk to, please call the Suicide and Crisis Lifeline: 988   Patient verbalizes understanding of instructions and care plan provided today and agrees to view in [place]. Active MyChart status confirmed with patient.    Telephone follow up appointment with care management team member scheduled for: 01/16/22  Elliot Gurney, [place] Worker  [facility] [phone]

## 2022-01-16 ENCOUNTER — Telehealth: Payer: Medicare Other

## 2022-01-17 ENCOUNTER — Telehealth: Payer: Self-pay

## 2022-01-17 ENCOUNTER — Ambulatory Visit: Payer: Medicare Other | Admitting: *Deleted

## 2022-01-17 DIAGNOSIS — I10 Essential (primary) hypertension: Secondary | ICD-10-CM

## 2022-01-17 DIAGNOSIS — F3110 Bipolar disorder, current episode manic without psychotic features, unspecified: Secondary | ICD-10-CM

## 2022-01-17 DIAGNOSIS — R809 Proteinuria, unspecified: Secondary | ICD-10-CM

## 2022-01-17 NOTE — Telephone Encounter (Signed)
Copied from [place] [id number]. Topic: General - Other >> Jan 17, 2022  2:31 PM [place] wrote: Reason for CRM: Pt just letting [facility] [place] know that she has secured transportation for 1/31 appt They have her number.

## 2022-01-17 NOTE — Telephone Encounter (Signed)
Copied from [place] [id number]. Topic: General - Other >> Jan 17, 2022  2:31 PM [place] wrote: Reason for CRM: Pt just letting [facility] [place] know that she has secured transportation for 1/31 appt They have her number.

## 2022-01-18 NOTE — Patient Instructions (Signed)
1Visit Information  Thank you for taking time to visit with me today. Please don't hesitate to contact me if I can be of assistance to you before our next scheduled telephone appointment.  Following are the goals we discussed today:   - arrange a ride through an agency 1 week before appointment - call to cancel if needed - keep a calendar with appointment dates  -Gastroenterology appointment scheduled for 01/24/22 2:15pm [place], [place] arranged through [facility] for GI appointment reference number [id number]  Our next appointment is by telephone on 01/1022 at 1pm  Please call the care guide team at [phone] if you need to cancel or reschedule your appointment.   If you are experiencing a Mental Health or [place] or need someone to talk to, please call the Suicide and Crisis Lifeline: 988   Patient verbalizes understanding of instructions and care plan provided today and agrees to view in [place]. Active MyChart status confirmed with patient.    Telephone follow up appointment with care management team member scheduled for:02/03/22  Elliot Gurney, [place] Worker  [facility] Care Management [phone]

## 2022-01-18 NOTE — Chronic Care Management (AMB) (Signed)
Chronic Care Management    Clinical Social Work Note  01/18/2022 Name: April Luna MRN: [id number] DOB: 1971/05/11  April Luna is a 51 y.o. year old female who is a primary care patient of Steele Sizer, MD. The CCM team was consulted to assist the patient with chronic disease management and/or care coordination needs related to: [employer] .   Engaged with patient by telephone for follow up visit in response to provider referral for social work chronic care management and care coordination services.   Consent to Services:  The patient was given information about Chronic Care Management services, agreed to services, and gave verbal consent prior to initiation of services.  Please see initial visit note for detailed documentation.   Patient agreed to services and consent obtained.   Assessment: Review of patient past medical history, allergies, medications, and health status, including review of relevant consultants reports was performed today as part of a comprehensive evaluation and provision of chronic care management and care coordination services.     SDOH (Social Determinants of Health) assessments and interventions performed:    Advanced Directives Status: Not addressed in this encounter.  CCM Care Plan  Allergies  Allergen Reactions   Metformin And Related Nausea And Vomiting   Penicillins Other (See Comments)    She has taken amoxicillin without problems   Perphenazine Other (See Comments)    Tremors, muscle weakness, tongue swelling   Sulfa Antibiotics Other (See Comments)    GI distress    Abilify [Aripiprazole] Rash    Outpatient Encounter Medications as of 01/17/2022  Medication Sig Note   albuterol (PROVENTIL) (2.5 MG/3ML) 0.083% nebulizer solution Take 3 mLs (2.5 mg total) by nebulization every 6 (six) hours as needed for wheezing or shortness of breath.    amantadine (SYMMETREL) 100 MG capsule Take 100 mg by mouth 2 (two) times daily.     blood glucose meter kit and supplies Dispense based on patient and insurance preference. Use up to four times daily as directed. (FOR ICD-10 E10.9, E11.9).    Budeson-Glycopyrrol-Formoterol (BREZTRI AEROSPHERE) 160-9-4.8 MCG/ACT AERO Inhale 2 puffs into the lungs in the morning and at bedtime.    buPROPion (WELLBUTRIN XL) 150 MG 24 hr tablet Take 150 mg by mouth daily.    Cholecalciferol (VITAMIN D-3) 125 MCG (5000 UT) TABS Take 5,000 Units by mouth daily.    gabapentin (NEURONTIN) 300 MG capsule Take 300 mg by mouth 2 (two) times daily.    hydrOXYzine (VISTARIL) 50 MG capsule Take 50 mg by mouth 2 (two) times daily as needed for anxiety.    icosapent Ethyl (VASCEPA) 1 g capsule Take 2 capsules (2 g total) by mouth 2 (two) times daily.    INVEGA SUSTENNA 234 MG/1.5ML SUSY injection Inject 1.5 mLs into the muscle every 30 (thirty) days. Inject as directed once a month    levothyroxine (SYNTHROID) 75 MCG tablet Take 1 tablet (75 mcg total) by mouth daily before breakfast.    loratadine (CLARITIN) 10 MG tablet Take 10 mg by mouth daily.    losartan (COZAAR) 50 MG tablet Take 1 tablet (50 mg total) by mouth daily.    lubiprostone (AMITIZA) 24 MCG capsule TAKE 1 CAPSULE BY MOUTH TWICE A DAY WITHA MEAL    nicotine (NICOTROL) 10 MG inhaler Inhale 1 Cartridge (1 continuous puffing total) into the lungs as needed for smoking cessation. 07/07/2021: Pt plans to start using to help quit smoking    pantoprazole (PROTONIX) 40 MG tablet Take 1  tablet (40 mg total) by mouth daily.    rosuvastatin (CRESTOR) 40 MG tablet Take 1 tablet (40 mg total) by mouth daily.    sertraline (ZOLOFT) 100 MG tablet Take 200 mg by mouth daily.    No facility-administered encounter medications on file as of 01/17/2022.    Patient Active Problem List   Diagnosis Date Noted   Sleep apnea 08/11/2021   Plantar callus 10/06/2019   Acquired hallux limitus of both feet 10/06/2019   Type 2 diabetes mellitus with microalbuminuria,  without long-term current use of insulin ([place]) 06/24/2019   Chronic obstructive pulmonary disease ([place]) 06/24/2019   Chronic constipation 06/24/2019   ASCUS of cervix with negative high risk HPV 09/05/2018   GERD without esophagitis 04/11/2018   Hyperlipidemia 04/11/2018   Hypertension 04/11/2018   Hypothyroidism 04/26/2015   Bipolar I disorder, most recent episode (or current) manic ([place]) 04/25/2015   Delirium due to another medical condition 04/25/2015   Cannabis abuse 04/25/2015    Conditions to be addressed/monitored: HTN, DMII, and Bipolar Disorder; Mental Health Concerns   Care Plan : General Social Work (Adult)  Updates made by Vern Claude, LCSW since 01/18/2022 12:00 AM     Problem: CHL AMB "PATIENT-SPECIFIC PROBLEM"   Note:   CARE PLAN ENTRY (see longitudinal plan of care for additional care plan information)  Current Barriers:  Patient with HTN, DM, and Bipolar Disorder in need of assistance with connection to community resources-patient states that she get's overwhelmed with the coordination of appointments and has a hard time following through Knowledge deficits and need for support, education and care coordination related to community resources support  Patient having difficulty keeping specialist appointments  Clinical Goal(s)  Over the next  90  days patient will be able to schedule and keep medical appointments as demonstrated by increased ability to schedule both the upcoming appointments and arranging transportation to get there. Over the next 90 days, patient will continue to work with the ACT Team (community agency) to maintain adherence to medical appointments and medication management  Interventions provided by LCSW:  Assessed patient's care coordination needs related to specialist needs and discussed ongoing care management follow up  Appointment now needed with [facility] for bilateral breast  [place] contacted to schedule transportation  could not be arranged [employer] did not [place] did not answer as well to schedule procedure Gastroenterology appointment scheduled for 01/24/22 2:15pm [place], [place] -transportation could not be made, [facility] did not answer Patient discussed needed for follow up with Urology due to pain in [place] was confirmed that patient's provider will need to send a referral Additional appointments with specialist to be scheduled following follow up with [facility] and Gastroenterology Positive Reinforcement continues to be provided for motivation to follow up with needed specialist and continued follow up with the ACT Team for her mental health needs 01/13/22 Phone call to [facility] to arrange transportation to patient's appointment with Gastroenterology. This Education officer, museum collaborated with care advocate for over 45 minutes, arrangements made however per care advocate, patient would have to pay $32.00 for a Lyft or [place], no cost if she went by non-emergent ambulance Patient  contacted, she is not able to pay this amount for the ride and cost to see the specialist. [employer] representative Amy Alyse Low contacted who suggested that this social worker send a email to Jearld Pies to request a member escalation email sent to [email or web address] 01/18/22 Update  multiple phone calls to [facility] to confirm transport to patient's GI appointment, transportation arranged-patient on call as well-reference # for ride [phone]. Ride confirmed to be at no cost to patient as it is part of her benefit [facility] care advocate contacted patient to confirm ride as well Alternative options for transportation to medical appointments discussed, per patient her ACT team is also assisting with options   Patient Self Care Activities & Deficits:  Patient is unable to independently navigate community resource options without care coordination support  Acknowledges deficits and  is motivated to resolve concern  Patient is able to contact her Act Team  as discussed today for additional assistance with coordinating specialist appointments Unable to perform IADLs independently Performs ADL's independently Motivation for treatment  Please see past updates related to this goal by clicking on the "Past Updates" button in the selected goal         Follow Up Plan: SW will follow up with patient by phone over the next 14 business days      [place], [place] Worker  [facility] Care Management [phone]

## 2022-01-24 ENCOUNTER — Ambulatory Visit (INDEPENDENT_AMBULATORY_CARE_PROVIDER_SITE_OTHER): Payer: Medicare Other | Admitting: Gastroenterology

## 2022-01-24 ENCOUNTER — Other Ambulatory Visit: Payer: Self-pay

## 2022-01-24 ENCOUNTER — Encounter: Payer: Self-pay | Admitting: Gastroenterology

## 2022-01-24 VITALS — BP 133/83 | HR 96 | Temp 98.6°F | Ht 60.0 in | Wt 153.5 lb

## 2022-01-24 DIAGNOSIS — K5909 Other constipation: Secondary | ICD-10-CM | POA: Diagnosis not present

## 2022-01-24 DIAGNOSIS — K625 Hemorrhage of anus and rectum: Secondary | ICD-10-CM | POA: Diagnosis not present

## 2022-01-24 DIAGNOSIS — I10 Essential (primary) hypertension: Secondary | ICD-10-CM

## 2022-01-24 DIAGNOSIS — Z1211 Encounter for screening for malignant neoplasm of colon: Secondary | ICD-10-CM | POA: Diagnosis not present

## 2022-01-24 DIAGNOSIS — R809 Proteinuria, unspecified: Secondary | ICD-10-CM

## 2022-01-24 DIAGNOSIS — E1129 Type 2 diabetes mellitus with other diabetic kidney complication: Secondary | ICD-10-CM

## 2022-01-24 MED ORDER — LINACLOTIDE 145 MCG PO CAPS: 145.0000 ug | ORAL_CAPSULE | Freq: Every day | ORAL | 2 refills | Status: DC

## 2022-01-24 MED ORDER — NA SULFATE-K SULFATE-MG SULF 17.5-3.13-1.6 GM/177ML PO SOLN
354.0000 mL | Freq: Once | ORAL | 0 refills | Status: AC
Start: 2022-01-24 — End: 2022-01-24

## 2022-01-24 NOTE — Progress Notes (Signed)
[name], MD [address]  [place]  [place], [place] [address]  Main: [phone]  Fax: [phone]    Gastroenterology Consultation  Referring Provider:     Steele Sizer, MD Primary Care Physician:  Steele Sizer, MD Primary Gastroenterologist:  Dr. [name] Reason for Consultation:    Chronic constipation, rectal bleeding       HPI:   April Luna is a 51 y.o. female referred by Dr. Steele Sizer, MD  for consultation & management of chronic diarrhea and abdominal bloating.  Patient reports that she has been experiencing several weeks of nonbloody diarrhea, 3-4 times daily and sometimes nocturnal diarrhea as well.  She does report severe abdominal bloating.  She does acknowledge drinking sweet tea daily.  She denies any weight loss.  Apparently, she has history of chronic constipation for which she has been on Amitiza 24 MCG, however, she has been taking only once a day because it leads to diarrhea.  History of metabolic syndrome, hypothyroidism, prediabetes.  Her TSH is normal.  No evidence of anemia.  She is also here to discuss about colonoscopy She does smoke 1 pack/day, since age of 53  Follow-up visit 05/30/2021 Patient reports that her diarrhea has resolved after stopping Amitiza.  She could not undergo colonoscopy due to financial constraints.  She reports ongoing constipation.  She thinks she does not eat enough fiber or has adequate intake of water  Follow-up visit 01/25/2022 Patient is here to discuss about colonoscopy as well as severe constipation.  Patient reports that she is taking Amitiza 24 MCG daily which is not helping move her bowels.  She always feels like something stuck and incomplete emptying.  Patient is accompanied by care coordinator due to her underlying psychiatric illnesses and concern for medication nonadherence.  Patient reports that she has been noticing bright red blood per rectum as well.  Patient wants to undergo  colonoscopy.  NSAIDs: None  Antiplts/Anticoagulants/Anti thrombotics: None  GI Procedures: None Father with colon cancer in his 30s  Past Medical History:  Diagnosis Date   Allergy    pollen   Anxiety    Bipolar 1 disorder (HCC)    Chronic kidney disease    COPD (chronic obstructive pulmonary disease) (HCC)    Depression    Family history of adverse reaction to anesthesia    grand father had a stroke during anesthesia   GERD (gastroesophageal reflux disease)    History of kidney stones    Hyperlipidemia    Hypertension    Hypothyroidism    Pneumonia    Sleep apnea 08/11/2021   Type 2 diabetes mellitus with microalbuminuria, without long-term current use of insulin ([place]) 06/24/2019    Past Surgical History:  Procedure Laterality Date   BREAST BIOPSY Left 12/14/2021   [place] bx, venus marker, path pending   CESAREAN SECTION     CYSTOSCOPY W/ RETROGRADES Left 11/08/2018   Procedure: CYSTOSCOPY WITH RETROGRADE PYELOGRAM;  Surgeon: Billey Co, MD;  Location: [facility] ORS;  Service: Urology;  Laterality: Left;   CYSTOSCOPY/URETEROSCOPY/HOLMIUM LASER/STENT PLACEMENT Left 11/08/2018   Procedure: CYSTOSCOPY/URETEROSCOPY/HOLMIUM LASER/STENT PLACEMENT;  Surgeon: Billey Co, MD;  Location: [facility] ORS;  Service: Urology;  Laterality: Left;   MOUTH SURGERY     wisdom teeth extraction    Current Outpatient Medications:    albuterol (PROVENTIL) (2.5 MG/3ML) 0.083% nebulizer solution, Take 3 mLs (2.5 mg total) by nebulization every 6 (six) hours as needed for wheezing or shortness of  breath., Disp: 150 mL, Rfl: 1   amantadine (SYMMETREL) 100 MG capsule, Take 100 mg by mouth 2 (two) times daily., Disp: , Rfl:    blood glucose meter kit and supplies, Dispense based on patient and insurance preference. Use up to four times daily as directed. (FOR ICD-10 E10.9, E11.9)., Disp: 1 each, Rfl: 0   Budeson-Glycopyrrol-Formoterol (BREZTRI AEROSPHERE) 160-9-4.8 MCG/ACT AERO, Inhale 2 puffs into  the lungs in the morning and at bedtime., Disp: 5.9 g, Rfl: 11   buPROPion (WELLBUTRIN XL) 150 MG 24 hr tablet, Take 150 mg by mouth daily., Disp: , Rfl:    Cholecalciferol (VITAMIN D-3) 125 MCG (5000 UT) TABS, Take 5,000 Units by mouth daily., Disp: 30 tablet, Rfl: 1   gabapentin (NEURONTIN) 300 MG capsule, Take 300 mg by mouth 2 (two) times daily., Disp: , Rfl:    hydrOXYzine (VISTARIL) 50 MG capsule, Take 50 mg by mouth 2 (two) times daily as needed for anxiety., Disp: , Rfl:    icosapent Ethyl (VASCEPA) 1 g capsule, Take 2 capsules (2 g total) by mouth 2 (two) times daily., Disp: 120 capsule, Rfl: 3   INVEGA SUSTENNA 234 MG/1.5ML SUSY injection, Inject 1.5 mLs into the muscle every 30 (thirty) days. Inject as directed once a month, Disp: , Rfl:    levothyroxine (SYNTHROID) 75 MCG tablet, Take 1 tablet (75 mcg total) by mouth daily before breakfast., Disp: 30 tablet, Rfl: 3   linaclotide (LINZESS) 145 MCG CAPS capsule, Take 1 capsule (145 mcg total) by mouth daily before breakfast., Disp: 30 capsule, Rfl: 2   loratadine (CLARITIN) 10 MG tablet, Take 10 mg by mouth daily., Disp: , Rfl:    losartan (COZAAR) 50 MG tablet, Take 1 tablet (50 mg total) by mouth daily., Disp: 30 tablet, Rfl: 3   lubiprostone (AMITIZA) 24 MCG capsule, TAKE 1 CAPSULE BY MOUTH TWICE A DAY WITHA MEAL, Disp: 60 capsule, Rfl: 3   pantoprazole (PROTONIX) 40 MG tablet, Take 1 tablet (40 mg total) by mouth daily., Disp: 30 tablet, Rfl: 3   rosuvastatin (CRESTOR) 40 MG tablet, Take 1 tablet (40 mg total) by mouth daily., Disp: 30 tablet, Rfl: 3   sertraline (ZOLOFT) 100 MG tablet, Take 200 mg by mouth daily., Disp: , Rfl:    nicotine (NICOTROL) 10 MG inhaler, Inhale 1 Cartridge (1 continuous puffing total) into the lungs as needed for smoking cessation. (Patient not taking: Reported on 01/24/2022), Disp: 42 each, Rfl: 3   Family History  Problem Relation Age of Onset   Depression Mother    Anxiety disorder Mother    Diabetes  Mother    Hypertension Mother    Hyperlipidemia Mother    Cancer Mother    Cancer Father        colon cancer   Depression Brother    Anxiety disorder Brother    Diabetes Mellitus II Maternal Grandmother    Hypercholesterolemia Maternal Grandmother    Cancer Paternal Grandmother    Diabetes Paternal Grandmother      Social History   Tobacco Use   Smoking status: Every Day    Packs/day: 1.00    Years: 34.00    Pack years: 34.00    Types: Cigarettes    Start date: 05/13/1985   Smokeless tobacco: Never   Tobacco comments:    1 PPD 02/24/2021  Vaping Use   Vaping Use: Former   Start date: 05/25/2018   Quit date: 09/24/2018  Substance Use Topics   Alcohol use: Not Currently  Alcohol/week: 4.0 standard drinks    Types: 4 Glasses of wine per week    Comment: occasional   Drug use: Not Currently    Types: Marijuana    Comment: none since August 2019    Allergies as of 01/24/2022 - Review Complete 01/24/2022  Allergen Reaction Noted   Metformin and related Nausea And Vomiting 06/24/2019   Penicillins Other (See Comments) 04/23/2015   Perphenazine Other (See Comments) 04/23/2015   Sulfa antibiotics Other (See Comments) 04/23/2015   Abilify [aripiprazole] Rash 04/23/2015    Review of Systems:    All systems reviewed and negative except where noted in HPI.   Physical Exam:  BP 133/83 (BP Location: Left Arm, Patient Position: Sitting, Cuff Size: Normal)    Pulse 96    Temp 98.6 F (37 C) (Oral)    Ht 5' (1.524 m)    Wt 153 lb 8 oz (69.6 kg)    BMI 29.98 kg/m  No LMP recorded. Patient is postmenopausal.  General:   Alert,  Well-developed, well-nourished, pleasant and cooperative in NAD Head:  Normocephalic and atraumatic. Eyes:  Sclera clear, no icterus.   Conjunctiva pink. Ears:  Normal auditory acuity. Nose:  No deformity, discharge, or lesions. Mouth:  No deformity or lesions,oropharynx pink & moist. Neck:  Supple; no masses or thyromegaly. Lungs:  Respirations even  and unlabored.  Clear throughout to auscultation.   No wheezes, crackles, or rhonchi. No acute distress. Heart:  Regular rate and rhythm; no murmurs, clicks, rubs, or gallops. Abdomen:  Normal bowel sounds. Soft, obese, non-tender, nondistended without masses, hepatosplenomegaly or hernias noted.  No guarding or rebound tenderness.   Rectal: Not performed Msk:  Symmetrical without gross deformities. Good, equal movement & strength bilaterally. Pulses:  Normal pulses noted. Extremities:  No clubbing or edema.  No cyanosis. Neurologic:  Alert and oriented x3;  grossly normal neurologically. Skin:  Intact without significant lesions or rashes. No jaundice. Psych:  Alert and cooperative. Normal mood and affect.  Imaging Studies: Reviewed  Assessment and Plan:   April Luna is a 51 y.o. pleasant Caucasian female with metabolic syndrome, hypothyroidism, chronic tobacco use is seen in consultation for chronic severe constipation, bright red blood per rectum  Chronic constipation Discussed about high-fiber diet, adequate intake of water, information provided Switch from Amitiza 24 MCG twice daily to Linzess 142 MCG daily, samples provided and prescription sent  Rectal bleeding Likely secondary to hemorrhoids.  Discussed with patient regarding hemorrhoid ligation if her bleeding persists despite regulating her bowel movements.  Colon cancer screening Recommend colonoscopy with 2-day prep   Follow up in 3 months   [name], MD

## 2022-01-24 NOTE — Patient Instructions (Addendum)
Stop Amitiza and Start Linzess  High-Fiber Eating Plan Fiber, also called dietary fiber, is a type of carbohydrate. It is found foods such as fruits, vegetables, whole grains, and beans. A high-fiber diet can have many health benefits. Your health care provider may recommend a high-fiber diet to help: Prevent constipation. Fiber can make your bowel movements more regular. Lower your cholesterol. Relieve the following conditions: Inflammation of veins in the anus (hemorrhoids). Inflammation of specific areas of the digestive tract (uncomplicated diverticulosis). A problem of the large intestine, also called the colon, that sometimes causes pain and diarrhea (irritable bowel syndrome, or IBS). Prevent overeating as part of a weight-loss plan. Prevent heart disease, type 2 diabetes, and certain cancers. What are tips for following this plan? Reading food labels  Check the nutrition facts label on food products for the amount of dietary fiber. Choose foods that have 5 grams of fiber or more per serving. The goals for recommended daily fiber intake include: Men (age 50 or younger): 34-38 g. Men (over age 22): 28-34 g. Women (age 50 or younger): 25-28 g. Women (over age 50): 22-25 g. Your daily fiber goal is _____________ g. Shopping Choose whole fruits and vegetables instead of processed forms, such as apple juice or applesauce. Choose a wide variety of high-fiber foods such as avocados, lentils, oats, and kidney beans. Read the nutrition facts label of the foods you choose. Be aware of foods with added fiber. These foods often have high sugar and sodium amounts per serving. Cooking Use whole-grain flour for baking and cooking. Cook with brown rice instead of white rice. Meal planning Start the day with a breakfast that is high in fiber, such as a cereal that contains 5 g of fiber or more per serving. Eat breads and cereals that are made with whole-grain flour instead of refined flour or  white flour. Eat brown rice, bulgur wheat, or millet instead of white rice. Use beans in place of meat in soups, salads, and pasta dishes. Be sure that half of the grains you eat each day are whole grains. General information You can get the recommended daily intake of dietary fiber by: Eating a variety of fruits, vegetables, grains, nuts, and beans. Taking a fiber supplement if you are not able to take in enough fiber in your diet. It is better to get fiber through food than from a supplement. Gradually increase how much fiber you consume. If you increase your intake of dietary fiber too quickly, you may have bloating, cramping, or gas. Drink plenty of water to help you digest fiber. Choose high-fiber snacks, such as berries, raw vegetables, nuts, and popcorn. What foods should I eat? Fruits Berries. Pears. Apples. Oranges. Avocado. Prunes and raisins. Dried figs. Vegetables Sweet potatoes. Spinach. Kale. Artichokes. Cabbage. Broccoli. Cauliflower. Green peas. Carrots. Squash. Grains Whole-grain breads. Multigrain cereal. Oats and oatmeal. Brown rice. Barley. Bulgur wheat. [place]. Quinoa. Bran muffins. Popcorn. Rye wafer crackers. Meats and other proteins Navy beans, kidney beans, and pinto beans. Soybeans. Split peas. Lentils. Nuts and seeds. Dairy Fiber-fortified yogurt. Beverages Fiber-fortified soy milk. Fiber-fortified orange juice. Other foods Fiber bars. The items listed above may not be a complete list of recommended foods and beverages. Contact a dietitian for more information. What foods should I avoid? Fruits Fruit juice. Cooked, strained fruit. Vegetables Fried potatoes. Canned vegetables. Well-cooked vegetables. Grains White bread. Pasta made with refined flour. White rice. Meats and other proteins Fatty cuts of meat. Fried chicken or fried fish. Dairy Milk. Yogurt.  Cream cheese. Sour cream. Fats and oils Butters. Beverages Soft drinks. Other foods Cakes and  pastries. The items listed above may not be a complete list of foods and beverages to avoid. Talk with your dietitian about what choices are best for you. Summary Fiber is a type of carbohydrate. It is found in foods such as fruits, vegetables, whole grains, and beans. A high-fiber diet has many benefits. It can help to prevent constipation, lower blood cholesterol, aid weight loss, and reduce your risk of heart disease, diabetes, and certain cancers. Increase your intake of fiber gradually. Increasing fiber too quickly may cause cramping, bloating, and gas. Drink plenty of water while you increase the amount of fiber you consume. The best sources of fiber include whole fruits and vegetables, whole grains, nuts, seeds, and beans. This information is not intended to replace advice given to you by your health care provider. Make sure you discuss any questions you have with your health care provider. Document Revised: 04/15/2020 Document Reviewed: 04/15/2020 Elsevier Patient Education  2022 [employer].

## 2022-01-25 ENCOUNTER — Encounter: Payer: Self-pay | Admitting: Gastroenterology

## 2022-02-03 ENCOUNTER — Ambulatory Visit (INDEPENDENT_AMBULATORY_CARE_PROVIDER_SITE_OTHER): Payer: Medicare Other | Admitting: *Deleted

## 2022-02-03 ENCOUNTER — Telehealth: Payer: Self-pay | Admitting: *Deleted

## 2022-02-03 DIAGNOSIS — F3110 Bipolar disorder, current episode manic without psychotic features, unspecified: Secondary | ICD-10-CM

## 2022-02-03 DIAGNOSIS — E1129 Type 2 diabetes mellitus with other diabetic kidney complication: Secondary | ICD-10-CM

## 2022-02-03 DIAGNOSIS — I10 Essential (primary) hypertension: Secondary | ICD-10-CM

## 2022-02-03 NOTE — Telephone Encounter (Signed)
°  Care Management   Follow Up Note   02/03/2022 Name: April Luna MRN: [id number] DOB: Mar 24, 1971   Referred by: Steele Sizer, MD Reason for referral : Care Coordination   An unsuccessful telephone outreach was attempted today. The patient was referred to the case management team for assistance with care management and care coordination.   Follow Up Plan: Telephone follow up appointment with care management team member scheduled for: 02/13/22  Elliot Gurney, [place] Worker  [facility] Care Management [phone]

## 2022-02-04 NOTE — Chronic Care Management (AMB) (Addendum)
Chronic Care Management    Clinical Social Work Note  02/04/2022 Name: April Luna MRN: [id number] DOB: 07-27-1971  April Luna is a 51 y.o. year old female who is a primary care patient of Steele Sizer, MD. The CCM team was consulted to assist the patient with chronic disease management and/or care coordination needs related to: [employer] .   Engaged with patient by telephone for follow up visit in response to provider referral for social work chronic care management and care coordination services.   Consent to Services:  The patient was given information about Chronic Care Management services, agreed to services, and gave verbal consent prior to initiation of services.  Please see initial visit note for detailed documentation.   Patient agreed to services and consent obtained.   Assessment: Review of patient past medical history, allergies, medications, and health status, including review of relevant consultants reports was performed today as part of a comprehensive evaluation and provision of chronic care management and care coordination services.     SDOH (Social Determinants of Health) assessments and interventions performed:    Advanced Directives Status: Not addressed in this encounter.  CCM Care Plan  Allergies  Allergen Reactions   Metformin And Related Nausea And Vomiting   Perphenazine Other (See Comments)    Tremors, muscle weakness, tongue swelling   Sulfa Antibiotics Other (See Comments)    GI distress    Abilify [Aripiprazole] Rash   Penicillins Rash    She has taken amoxicillin without problems    Outpatient Encounter Medications as of 02/03/2022  Medication Sig Note   albuterol (PROVENTIL) (2.5 MG/3ML) 0.083% nebulizer solution Take 3 mLs (2.5 mg total) by nebulization every 6 (six) hours as needed for wheezing or shortness of breath.    amantadine (SYMMETREL) 100 MG capsule Take 100 mg by mouth 2 (two) times daily.    blood glucose  meter kit and supplies Dispense based on patient and insurance preference. Use up to four times daily as directed. (FOR ICD-10 E10.9, E11.9).    Budeson-Glycopyrrol-Formoterol (BREZTRI AEROSPHERE) 160-9-4.8 MCG/ACT AERO Inhale 2 puffs into the lungs in the morning and at bedtime.    buPROPion (WELLBUTRIN XL) 150 MG 24 hr tablet Take 150 mg by mouth daily.    Cholecalciferol (VITAMIN D-3) 125 MCG (5000 UT) TABS Take 5,000 Units by mouth daily.    gabapentin (NEURONTIN) 300 MG capsule Take 300 mg by mouth 2 (two) times daily.    hydrOXYzine (VISTARIL) 50 MG capsule Take 50 mg by mouth 2 (two) times daily as needed for anxiety.    icosapent Ethyl (VASCEPA) 1 g capsule Take 2 capsules (2 g total) by mouth 2 (two) times daily.    INVEGA SUSTENNA 234 MG/1.5ML SUSY injection Inject 1.5 mLs into the muscle every 30 (thirty) days. Inject as directed once a month    levothyroxine (SYNTHROID) 75 MCG tablet Take 1 tablet (75 mcg total) by mouth daily before breakfast.    linaclotide (LINZESS) 145 MCG CAPS capsule Take 1 capsule (145 mcg total) by mouth daily before breakfast.    loratadine (CLARITIN) 10 MG tablet Take 10 mg by mouth daily.    losartan (COZAAR) 50 MG tablet Take 1 tablet (50 mg total) by mouth daily.    lubiprostone (AMITIZA) 24 MCG capsule TAKE 1 CAPSULE BY MOUTH TWICE A DAY WITHA MEAL (Patient not taking: Reported on 01/25/2022)    nicotine (NICOTROL) 10 MG inhaler Inhale 1 Cartridge (1 continuous puffing total) into the lungs as  needed for smoking cessation. (Patient not taking: Reported on 01/24/2022) 07/07/2021: Pt plans to start using to help quit smoking    pantoprazole (PROTONIX) 40 MG tablet Take 1 tablet (40 mg total) by mouth daily.    rosuvastatin (CRESTOR) 40 MG tablet Take 1 tablet (40 mg total) by mouth daily.    sertraline (ZOLOFT) 100 MG tablet Take 200 mg by mouth daily.    No facility-administered encounter medications on file as of 02/03/2022.    Patient Active Problem List    Diagnosis Date Noted   Sleep apnea 08/11/2021   Plantar callus 10/06/2019   Acquired hallux limitus of both feet 10/06/2019   Type 2 diabetes mellitus with microalbuminuria, without long-term current use of insulin ([place]) 06/24/2019   Chronic obstructive pulmonary disease ([place]) 06/24/2019   Chronic constipation 06/24/2019   ASCUS of cervix with negative high risk HPV 09/05/2018   GERD without esophagitis 04/11/2018   Hyperlipidemia 04/11/2018   Hypertension 04/11/2018   Hypothyroidism 04/26/2015   Bipolar I disorder, most recent episode (or current) manic ([place]) 04/25/2015   Delirium due to another medical condition 04/25/2015   Cannabis abuse 04/25/2015    Conditions to be addressed/monitored:  HTN, DMII, and Bipolar Disorder; Mental Health Concerns  Care Plan : General Social Work (Adult)  Updates made by Vern Claude, LCSW since 02/04/2022 12:00 AM     Problem: [facility] AMB "PATIENT-SPECIFIC PROBLEM"   Note:   CARE PLAN ENTRY (see longitudinal plan of care for additional care plan information)  Current Barriers:  Patient with HTN, DM, and Bipolar Disorder in need of assistance with connection to community resources-patient states that she get's overwhelmed with the coordination of appointments and has a hard time following through Knowledge deficits and need for support, education and care coordination related to community resources support  Patient having difficulty keeping specialist appointments  Clinical Goal(s)  Over the next  90  days patient will be able to schedule and keep medical appointments as demonstrated by increased ability to schedule both the upcoming appointments and arranging transportation to get there. Over the next 90 days, patient will continue to work with the ACT Team (community agency) to maintain adherence to medical appointments and medication management  Interventions provided by LCSW:  Assessed patient's care coordination needs related to  specialist needs and discussed ongoing care management follow up  Gastroenterology appointment scheduled for 01/24/22 2:15pm [place], [place] -completed Patient discussed needed for follow up with Urology due to pain in Kidneys as well as Pulmonologist-it was confirmed that patient's provider will need to send a referral  Patient has a colonoscopy scheduled for 02/09/22-per patient transportation has been arranged Additional appointments with specialist to be scheduled following follow up with [facility] and colonoscopy Positive Reinforcement continues to be provided for motivation to follow up with needed specialist and continued follow up with the ACT Team for her mental health needs   Patient Self Care Activities & Deficits:  Patient is unable to independently navigate community resource options without care coordination support  Acknowledges deficits and is motivated to resolve concern  Patient is able to contact her Act Team  as discussed today for additional assistance with coordinating specialist appointments Unable to perform IADLs independently Performs ADL's independently Motivation for treatment  Please see past updates related to this goal by clicking on the "Past Updates" button in the selected goal         Follow Up Plan:  SW will follow up with patient by phone over the next 30 business days      Victor, [place] Worker  [facility] Care Management [phone]

## 2022-02-04 NOTE — Patient Instructions (Signed)
Visit Information  Thank you for taking time to visit with me today. Please don't hesitate to contact me if I can be of assistance to you before our next scheduled telephone appointment.  Following are the goals we discussed today:   - arrange a ride through an agency 1 week before appointment - call to cancel if needed - keep a calendar with appointment dates  -Gastroenterology appointment scheduled for 01/24/22 2:15pm [place], [place] -completed -patient has a scheduled colonoscopy 02/09/22-patient has arranged transportation     [place] next appointment is by telephone on 03/03/22 at 1pm  Please call the care guide team at [phone] if you need to cancel or reschedule your appointment.   If you are experiencing a Mental Health or [place] or need someone to talk to, please call the Suicide and Crisis Lifeline: 988   Patient verbalizes understanding of instructions and care plan provided today and agrees to view in [place]. Active MyChart status confirmed with patient.    Telephone follow up appointment with care management team member scheduled for: 03/03/22   Elliot Gurney, [place] Worker  [facility] [phone]

## 2022-02-07 ENCOUNTER — Telehealth: Payer: Self-pay | Admitting: Family Medicine

## 2022-02-07 NOTE — Telephone Encounter (Signed)
Patient clalled in asked to speak with caseworker [employer], woudn't say what it was for.

## 2022-02-08 ENCOUNTER — Other Ambulatory Visit: Payer: Self-pay

## 2022-02-08 MED ORDER — NA SULFATE-K SULFATE-MG SULF 17.5-3.13-1.6 GM/177ML PO SOLN: 354.0000 mL | Freq: Once | ORAL | 0 refills | Status: AC

## 2022-02-09 ENCOUNTER — Encounter
Admission: RE | Disposition: A | Discharge status: Home / Self Care | Payer: Self-pay | Source: Home / Self Care | Attending: Gastroenterology

## 2022-02-09 ENCOUNTER — Ambulatory Visit: Payer: Medicare Other | Admitting: Anesthesiology

## 2022-02-09 ENCOUNTER — Ambulatory Visit
Admission: RE | Admit: 2022-02-09 | Discharge: 2022-02-09 | Disposition: A | Discharge status: Home / Self Care | Payer: Medicare Other | Attending: Gastroenterology | Admitting: Gastroenterology

## 2022-02-09 ENCOUNTER — Telehealth: Payer: Self-pay

## 2022-02-09 ENCOUNTER — Encounter: Payer: Self-pay | Admitting: Gastroenterology

## 2022-02-09 ENCOUNTER — Telehealth: Payer: Self-pay | Admitting: *Deleted

## 2022-02-09 ENCOUNTER — Other Ambulatory Visit: Payer: Self-pay

## 2022-02-09 DIAGNOSIS — I129 Hypertensive chronic kidney disease with stage 1 through stage 4 chronic kidney disease, or unspecified chronic kidney disease: Secondary | ICD-10-CM | POA: Diagnosis not present

## 2022-02-09 DIAGNOSIS — Z1211 Encounter for screening for malignant neoplasm of colon: Secondary | ICD-10-CM | POA: Diagnosis not present

## 2022-02-09 DIAGNOSIS — K635 Polyp of colon: Secondary | ICD-10-CM

## 2022-02-09 DIAGNOSIS — F17210 Nicotine dependence, cigarettes, uncomplicated: Secondary | ICD-10-CM | POA: Diagnosis not present

## 2022-02-09 DIAGNOSIS — D122 Benign neoplasm of ascending colon: Secondary | ICD-10-CM | POA: Diagnosis not present

## 2022-02-09 DIAGNOSIS — J449 Chronic obstructive pulmonary disease, unspecified: Secondary | ICD-10-CM | POA: Diagnosis not present

## 2022-02-09 DIAGNOSIS — K219 Gastro-esophageal reflux disease without esophagitis: Secondary | ICD-10-CM | POA: Diagnosis not present

## 2022-02-09 DIAGNOSIS — K6289 Other specified diseases of anus and rectum: Secondary | ICD-10-CM

## 2022-02-09 DIAGNOSIS — N189 Chronic kidney disease, unspecified: Secondary | ICD-10-CM | POA: Insufficient documentation

## 2022-02-09 DIAGNOSIS — G4730 Sleep apnea, unspecified: Secondary | ICD-10-CM | POA: Diagnosis not present

## 2022-02-09 DIAGNOSIS — C20 Malignant neoplasm of rectum: Secondary | ICD-10-CM | POA: Diagnosis not present

## 2022-02-09 DIAGNOSIS — E1122 Type 2 diabetes mellitus with diabetic chronic kidney disease: Secondary | ICD-10-CM | POA: Insufficient documentation

## 2022-02-09 HISTORY — PX: POLYPECTOMY: SHX5525

## 2022-02-09 HISTORY — PX: COLONOSCOPY WITH PROPOFOL: SHX5780

## 2022-02-09 HISTORY — DX: Unspecified osteoarthritis, unspecified site: M19.90

## 2022-02-09 SURGERY — COLONOSCOPY WITH PROPOFOL
Anesthesia: General | Site: Rectum

## 2022-02-09 MED ORDER — STERILE WATER FOR IRRIGATION IR SOLN
Status: DC | PRN
  Administered 2022-02-09: 120 mL

## 2022-02-09 MED ORDER — LACTATED RINGERS IV SOLN: INTRAVENOUS | Status: DC

## 2022-02-09 MED ORDER — SODIUM CHLORIDE 0.9 % IV SOLN: INTRAVENOUS | Status: DC

## 2022-02-09 MED ORDER — PROPOFOL 500 MG/50ML IV EMUL
INTRAVENOUS | Status: DC | PRN
  Administered 2022-02-09: 120 ug/kg/min via INTRAVENOUS

## 2022-02-09 MED ORDER — ALBUTEROL SULFATE HFA 108 (90 BASE) MCG/ACT IN AERS
INHALATION_SPRAY | RESPIRATORY_TRACT | Status: DC | PRN
  Administered 2022-02-09: 4 via RESPIRATORY_TRACT

## 2022-02-09 MED ORDER — LIDOCAINE HCL (CARDIAC) PF 100 MG/5ML IV SOSY
PREFILLED_SYRINGE | INTRAVENOUS | Status: DC | PRN
  Administered 2022-02-09: 60 mg via INTRAVENOUS

## 2022-02-09 MED ORDER — PROPOFOL 10 MG/ML IV BOLUS
INTRAVENOUS | Status: DC | PRN
  Administered 2022-02-09: 60 mg via INTRAVENOUS

## 2022-02-09 MED ORDER — STERILE WATER FOR IRRIGATION IR SOLN
Status: DC | PRN
  Administered 2022-02-09: 150 mL

## 2022-02-09 SURGICAL SUPPLY — 9 items
FORCEPS BIOP RAD 4 LRG CAP 4 (CUTTING FORCEPS) ×1 IMPLANT
GOWN CVR UNV OPN BCK APRN NK (MISCELLANEOUS) ×2 IMPLANT
GOWN ISOL THUMB LOOP REG UNIV (MISCELLANEOUS) ×4
KIT PRC NS LF DISP ENDO (KITS) ×1 IMPLANT
KIT PROCEDURE OLYMPUS (KITS) ×2
MANIFOLD NEPTUNE II (INSTRUMENTS) ×2 IMPLANT
SNARE COLD EXACTO (MISCELLANEOUS) ×1 IMPLANT
TRAP ETRAP POLY (MISCELLANEOUS) ×1 IMPLANT
WATER STERILE IRR 250ML POUR (IV SOLUTION) ×2 IMPLANT

## 2022-02-09 NOTE — Telephone Encounter (Signed)
Placed referral to [place] and oncology

## 2022-02-09 NOTE — Anesthesia Postprocedure Evaluation (Signed)
Anesthesia Post Note  Patient: April Luna  Procedure(s) Performed: COLONOSCOPY WITH PROPOFOL (Rectum) POLYPECTOMY (Rectum)     Patient location during evaluation: PACU Anesthesia Type: General Level of consciousness: awake and alert Pain management: pain level controlled Vital Signs Assessment: post-procedure vital signs reviewed and stable Respiratory status: spontaneous breathing, nonlabored ventilation, respiratory function stable and patient connected to nasal cannula oxygen Cardiovascular status: blood pressure returned to baseline and stable Postop Assessment: no apparent nausea or vomiting Anesthetic complications: no   No notable events documented.  April Luna, [name]

## 2022-02-09 NOTE — Transfer of Care (Signed)
Immediate Anesthesia Transfer of Care Note  Patient: April Luna  Procedure(s) Performed: COLONOSCOPY WITH PROPOFOL (Rectum) POLYPECTOMY (Rectum)  Patient Location: PACU  Anesthesia Type: General  Level of Consciousness: awake, alert  and patient cooperative  Airway and Oxygen Therapy: Patient Spontanous Breathing and Patient connected to supplemental oxygen  Post-op Assessment: Post-op Vital signs reviewed, Patient's Cardiovascular Status Stable, Respiratory Function Stable, Patent Airway and No signs of Nausea or vomiting  Post-op Vital Signs: Reviewed and stable  Complications: No notable events documented.

## 2022-02-09 NOTE — Op Note (Signed)
[facility] Patient Name: April Luna Procedure Date: 02/09/2022 8:29 AM MRN: [id number] Account #: [id number] Date of Birth: Nov 23, 1971 Admit Type: Outpatient Age: 51 Room: [facility] OR ROOM 01 Gender: Female Note Status: Finalized Instrument Name: [id number] Procedure:             Colonoscopy Indications:           Screening for colorectal malignant neoplasm, This is                         the patient's first colonoscopy Providers:             Lin Landsman MD, MD Referring MD:          Bethena Roys. Sowles, MD (Referring MD) Medicines:             General Anesthesia Complications:         No immediate complications. Estimated blood loss: None. Procedure:             Pre-Anesthesia Assessment:                        - Prior to the procedure, a History and Physical was                         performed, and patient medications and allergies were                         reviewed. The patient is competent. The risks and                         benefits of the procedure and the sedation options and                         risks were discussed with the patient. All questions                         were answered and informed consent was obtained.                         Patient identification and proposed procedure were                         verified by the physician, the nurse, the                         anesthesiologist, the anesthetist and the technician                         in the pre-procedure area in the procedure room in the                         endoscopy suite. Mental Status Examination: alert and                         oriented. Airway Examination: normal oropharyngeal                         airway and neck mobility. Respiratory Examination:  clear to auscultation. CV Examination: normal.                         Prophylactic Antibiotics: The patient does not require                         prophylactic  antibiotics. Prior Anticoagulants: The                         patient has taken no previous anticoagulant or                         antiplatelet agents. ASA Grade Assessment: II - A                         patient with mild systemic disease. After reviewing                         the risks and benefits, the patient was deemed in                         satisfactory condition to undergo the procedure. The                         anesthesia plan was to use general anesthesia.                         Immediately prior to administration of medications,                         the patient was re-assessed for adequacy to receive                         sedatives. The heart rate, respiratory rate, oxygen                         saturations, blood pressure, adequacy of pulmonary                         ventilation, and response to care were monitored                         throughout the procedure. The physical status of the                         patient was re-assessed after the procedure.                        After obtaining informed consent, the colonoscope was                         passed under direct vision. Throughout the procedure,                         the patient's blood pressure, pulse, and oxygen                         saturations were monitored continuously. The was  introduced through the anus and advanced to the the                         cecum, identified by appendiceal orifice and ileocecal                         valve. The colonoscopy was performed without                         difficulty. The patient tolerated the procedure well.                         The quality of the bowel preparation was evaluated                         using the BBPS [facility] Bowel Preparation Scale) with                         scores of: Right Colon = 3, Transverse Colon = 3 and                         Left Colon = 3 (entire mucosa seen well with no                          residual staining, small fragments of stool or opaque                         liquid). The total BBPS score equals 9. Findings:      The digital rectal exam revealed a firm rectal mass palpated 10 cm from       the anal verge.      A 5 mm polyp was found in the ascending colon. The polyp was sessile.       The polyp was removed with a cold snare. Resection and retrieval were       complete. Estimated blood loss: none.      A fungating, infiltrative and ulcerated non-obstructing large mass was       found in the distal rectum. The mass was partially circumferential       (involving one-third of the lumen circumference). Oozing was present.       Biopsies were taken with a cold forceps for histology. Estimated blood       loss: none. Impression:            - Rectal mass 10 cm from the anal verge.                        - One 5 mm polyp in the ascending colon, removed with                         a cold snare. Resected and retrieved.                        - Likely malignant tumor in the distal rectum.                         Biopsied. Recommendation:        - Discharge patient to home (with escort).                        -  Resume previous diet today.                        - Continue present medications.                        - Await pathology results.                        - Refer to an oncologist at appointment to be                         scheduled.                        - Refer to a colo-rectal surgeon at appointment to be                         scheduled.                        - Repeat colonoscopy in 1 year for surveillance. Procedure Code(s):     --- Professional ---                        [phone], Colonoscopy, flexible; with removal of                         tumor(s), polyp(s), or other lesion(s) by snare                         technique                        45380, 49, Colonoscopy, flexible; with biopsy, single                         or multiple Diagnosis  Code(s):     --- Professional ---                        Z12.11, Encounter for screening for malignant neoplasm                         of colon                        K62.89, Other specified diseases of anus and rectum                        K63.5, Polyp of colon                        D49.0, Neoplasm of unspecified behavior of digestive                         system CPT copyright 2019 American Medical Association. All rights reserved. The codes documented in this report are preliminary and upon coder review may  be revised to meet current compliance requirements. Dr. Ulyess Mort Lin Landsman MD, MD 02/09/2022 9:09:37 AM This report has been signed electronically. Number of Addenda: 0 Note Initiated On: 02/09/2022 8:29 AM Scope Withdrawal Time: 0 hours 13 minutes 16 seconds  Total Procedure Duration: 0 hours 16 minutes 20 seconds  Estimated Blood Loss:  Estimated blood loss: none.      [facility]

## 2022-02-09 NOTE — Anesthesia Preprocedure Evaluation (Signed)
Anesthesia Evaluation  Patient identified by MRN, date of birth, ID band Patient awake    Reviewed: Allergy & Precautions, H&P , NPO status , Patient's Chart, lab work & pertinent test results, reviewed documented beta blocker date and time   Airway Mallampati: II  TM Distance: >3 FB Neck ROM: full    Dental no notable dental hx.    Pulmonary sleep apnea , pneumonia, COPD, Current Smoker and Patient abstained from smoking.,    Pulmonary exam normal breath sounds clear to auscultation       Cardiovascular Exercise Tolerance: Good hypertension, negative cardio ROS   Rhythm:regular Rate:Normal     Neuro/Psych PSYCHIATRIC DISORDERS Anxiety Depression Bipolar Disorder negative neurological ROS     GI/Hepatic Neg liver ROS, GERD  ,  Endo/Other  diabetesHypothyroidism   Renal/GU Renal disease  negative genitourinary   Musculoskeletal   Abdominal   Peds  Hematology negative hematology ROS (+)   Anesthesia Other Findings   Reproductive/Obstetrics negative OB ROS                             Anesthesia Physical Anesthesia Plan  ASA: 2  Anesthesia Plan: General   Post-op Pain Management:    Induction:   PONV Risk Score and Plan: 2 and Treatment may vary due to age or medical condition and Propofol infusion  Airway Management Planned:   Additional Equipment:   Intra-op Plan:   Post-operative Plan:   Informed Consent: I have reviewed the patients History and Physical, chart, labs and discussed the procedure including the risks, benefits and alternatives for the proposed anesthesia with the patient or authorized representative who has indicated his/her understanding and acceptance.     Dental Advisory Given  Plan Discussed with: CRNA  Anesthesia Plan Comments:         Anesthesia Quick Evaluation

## 2022-02-09 NOTE — Telephone Encounter (Signed)
-----   Message from Lin Landsman, MD sent at 02/09/2022  9:20 AM EST ----- Regarding: Rectal cancer Caryl Pina  Please refer her to [facility] cancer center and [place] surgery Dx: rectal cancer   Thanks RV

## 2022-02-09 NOTE — H&P (Signed)
April Darby, MD [address]  [place]  [place], [place] [address]  Main: [phone]  Fax: [phone] Pager: [phone]  Primary Care Physician:  April Sizer, MD Primary Gastroenterologist:  Dr. Cephas Luna  Pre-Procedure History & Physical: HPI:  April Luna is a 51 y.o. female is here for an colonoscopy.   Past Medical History:  Diagnosis Date   Allergy    pollen   Anxiety    Arthritis    right hip   Bipolar 1 disorder (HCC)    Chronic kidney disease    COPD (chronic obstructive pulmonary disease) (HCC)    Depression    Family history of adverse reaction to anesthesia    grand father had a stroke during anesthesia   GERD (gastroesophageal reflux disease)    History of kidney stones    Hyperlipidemia    Hypertension    Hypothyroidism    Pneumonia    Sleep apnea 08/11/2021   No CPAP   Type 2 diabetes mellitus with microalbuminuria, without long-term current use of insulin ([place]) 06/24/2019    Past Surgical History:  Procedure Laterality Date   BREAST BIOPSY Left 12/14/2021   [place] bx, venus marker, path pending   CESAREAN SECTION     CYSTOSCOPY W/ RETROGRADES Left 11/08/2018   Procedure: CYSTOSCOPY WITH RETROGRADE PYELOGRAM;  Surgeon: Billey Co, MD;  Location: [facility] ORS;  Service: Urology;  Laterality: Left;   CYSTOSCOPY/URETEROSCOPY/HOLMIUM LASER/STENT PLACEMENT Left 11/08/2018   Procedure: CYSTOSCOPY/URETEROSCOPY/HOLMIUM LASER/STENT PLACEMENT;  Surgeon: Billey Co, MD;  Location: [facility] ORS;  Service: Urology;  Laterality: Left;   MOUTH SURGERY     wisdom teeth extraction    Prior to Admission medications   Medication Sig Start Date End Date Taking? Authorizing Provider  albuterol (PROVENTIL) (2.5 MG/3ML) 0.083% nebulizer solution Take 3 mLs (2.5 mg total) by nebulization every 6 (six) hours as needed for wheezing or shortness of breath. 02/04/19  Yes Poulose, Bethel Born, NP  amantadine (SYMMETREL) 100 MG capsule Take 100 mg  by mouth 2 (two) times daily. 03/19/18  Yes April Base, MD  Budeson-Glycopyrrol-Formoterol (BREZTRI AEROSPHERE) 160-9-4.8 MCG/ACT AERO Inhale 2 puffs into the lungs in the morning and at bedtime. 03/08/21  Yes April Pita, MD  buPROPion (WELLBUTRIN XL) 150 MG 24 hr tablet Take 150 mg by mouth daily. 04/09/18  Yes [place].  Cholecalciferol (VITAMIN D-3) 125 MCG (5000 UT) TABS Take 5,000 Units by mouth daily. 10/14/19  Yes Sowles, Drue Stager, MD  gabapentin (NEURONTIN) 300 MG capsule Take 300 mg by mouth 2 (two) times daily.   Yes April Field.  hydrOXYzine (VISTARIL) 50 MG capsule Take 50 mg by mouth 2 (two) times daily as needed for anxiety.   Yes April Ethyl (VASCEPA) 1 g capsule Take 2 capsules (2 g total) by mouth 2 (two) times daily. 10/24/21  Yes Sowles, Drue Stager, MD  INVEGA SUSTENNA 234 MG/1.5ML SUSY injection Inject 1.5 mLs into the muscle every 30 (thirty) days. Inject as directed once a month 06/24/20  Yes April [place].  levothyroxine (SYNTHROID) 75 MCG tablet Take 1 tablet (75 mcg total) by mouth daily before breakfast. 10/24/21  Yes Sowles, Drue Stager, MD  linaclotide [facility]) 145 MCG CAPS capsule Take 1 capsule (145 mcg total) by mouth daily before breakfast. 01/24/22  Yes April Gottlieb, Tally Due, MD  loratadine (CLARITIN) 10  MG tablet Take 10 mg by mouth daily.   Yes [provider]  losartan (COZAAR) 50 MG tablet Take 1 tablet (50 mg total) by mouth daily. 10/24/21  Yes Sowles, Drue Stager, MD  pantoprazole (PROTONIX) 40 MG tablet Take 1 tablet (40 mg total) by mouth daily. 10/24/21  Yes Sowles, Drue Stager, MD  rosuvastatin (CRESTOR) 40 MG tablet Take 1 tablet (40 mg total) by mouth daily. 10/24/21  Yes Sowles, Drue Stager, MD  sertraline (ZOLOFT) 100 MG tablet Take 200 mg by mouth daily.   Yes [place].  blood glucose meter kit and supplies Dispense based on patient and insurance preference. Use up to four times daily as directed. (FOR ICD-10 E10.9, E11.9). 06/24/19   April Luna  lubiprostone (AMITIZA) 24 MCG capsule TAKE 1 CAPSULE BY MOUTH TWICE A DAY WITHA MEAL Patient not taking: Reported on 01/25/2022 10/24/21   April Sizer, MD  nicotine (NICOTROL) 10 MG inhaler Inhale 1 Cartridge (1 continuous puffing total) into the lungs as needed for smoking cessation. Patient not taking: Reported on 01/24/2022 12/08/19   April Pita, MD    Allergies as of 01/24/2022 - Review Complete 01/24/2022  Allergen Reaction Noted   Metformin and related Nausea And Vomiting 06/24/2019   Penicillins Other (See Comments) 04/23/2015   Perphenazine Other (See Comments) 04/23/2015   Sulfa antibiotics Other (See Comments) 04/23/2015   Abilify [aripiprazole] Rash 04/23/2015    Family History  Problem Relation Age of Onset   Depression Mother    Anxiety disorder Mother    Diabetes Mother    Hypertension Mother    Hyperlipidemia Mother    Cancer Mother    Cancer Father        colon cancer   Depression Brother    Anxiety disorder Brother    Diabetes Mellitus II Maternal Grandmother    Hypercholesterolemia Maternal Grandmother    Cancer Paternal Grandmother    Diabetes Paternal Grandmother     Social History   Socioeconomic History   Marital status: Married    Spouse name: April Luna   Number of children: 1   Years of education: 12   Highest education level: High school graduate  Occupational History   Occupation: unemployed    Comment: disabled  Tobacco Use   Smoking status: Every Day    Packs/day: 1.00    Years: 34.00    Pack years: 34.00    Types: Cigarettes    Start date: 05/13/1985   Smokeless tobacco: Never   Tobacco comments:    1 PPD 02/24/2021 (started around age 23)  24 Use   Vaping Use: Former   Start date: 05/25/2018   Quit date:  09/24/2018  Substance and Sexual Activity   Alcohol use: Not Currently    Alcohol/week: 4.0 standard drinks    Types: 4 Glasses of wine per week    Comment: occasional   Drug use: Not Currently    Types: Marijuana    Comment: none since August 2019   Sexual activity: Not Currently    Birth control/protection: Post-menopausal  Other Topics Concern   Not on file  Social History Narrative   Not on file   Social Determinants of Health   Financial Resource Strain: Medium Risk   Difficulty of Paying Living Expenses: Somewhat hard  Food Insecurity: No Food Insecurity   Worried About Running Out of Food in the Last Year: Never true   Ran Out of Food in the  Last Year: Never true  Transportation Needs: Public librarian (Medical): Yes   Lack of Transportation (Non-Medical): Yes  Physical Activity: Inactive   Days of Exercise per Week: 0 days   Minutes of Exercise per Session: 0 min  Stress: No Stress Concern Present   Feeling of Stress : Not at all  Social Connections: Moderately Integrated   Frequency of Communication with Friends and Family: More than three times a week   Frequency of Social Gatherings with Friends and Family: Twice a week   Attends Religious Services: More than 4 times per year   Active Member of [employer] or Organizations: No   Attends Music therapist: Never   Marital Status: Married  Human resources officer Violence: Not At Risk   Fear of Current or Ex-Partner: No   Emotionally Abused: No   Physically Abused: No   Sexually Abused: No    Review of Systems: See HPI, otherwise negative ROS  Physical Exam: BP 117/87    Pulse 75    Temp 97.7 F (36.5 C) (Temporal)    Ht 5' (1.524 m)    Wt 67.6 kg    SpO2 92%    BMI 29.12 kg/m  General:   Alert,  pleasant and cooperative in NAD Head:  Normocephalic and atraumatic. Neck:  Supple; no masses or thyromegaly. Lungs:  Clear throughout to auscultation.    Heart:  Regular rate  and rhythm. Abdomen:  Soft, nontender and nondistended. Normal bowel sounds, without guarding, and without rebound.   Neurologic:  Alert and  oriented x4;  grossly normal neurologically.  Impression/Plan: April Luna is here for an colonoscopy to be performed for colon cancer screening  Risks, benefits, limitations, and alternatives regarding  colonoscopy have been reviewed with the patient.  Questions have been answered.  All parties agreeable.   [name], MD  02/09/2022, 7:51 AM

## 2022-02-09 NOTE — Anesthesia Procedure Notes (Signed)
Date/Time: 02/09/2022 8:42 AM Performed by: Dionne Bucy, CRNA Pre-anesthesia Checklist: Patient identified, Emergency Drugs available, Suction available, Patient being monitored and Timeout performed Patient Re-evaluated:Patient Re-evaluated prior to induction Oxygen Delivery Method: Nasal cannula Induction Type: IV induction Placement Confirmation: positive ETCO2

## 2022-02-09 NOTE — Telephone Encounter (Signed)
°  Care Management   Follow Up Note   02/09/2022 Name: April Luna MRN: [id number] DOB: 1971/01/19   Referred by: Steele Sizer, MD Reason for referral : Care Coordination   An unsuccessful telephone outreach was attempted today. The patient was referred to the case management team for assistance with care management and care coordination.    Patient requested a return call, however was not available at the time of the call.  Follow Up Plan: The patient has been provided with contact information for the care management team and has been advised to call with any health related questions or concerns.   SIGNATURE

## 2022-02-10 ENCOUNTER — Encounter: Payer: Self-pay | Admitting: Gastroenterology

## 2022-02-13 ENCOUNTER — Telehealth: Payer: Self-pay | Admitting: *Deleted

## 2022-02-13 ENCOUNTER — Telehealth: Payer: Self-pay | Admitting: Gastroenterology

## 2022-02-13 ENCOUNTER — Telehealth: Payer: Medicare Other

## 2022-02-13 LAB — SURGICAL PATHOLOGY

## 2022-02-13 NOTE — Telephone Encounter (Signed)
Discussed preliminary path results with the patient.  Rectal biopsies of the mass confirmed invasive adenocarcinoma.  She has appointment with Dr. Tasia Catchings on 2/24.  Patient did not have any further questions  [name], MD

## 2022-02-13 NOTE — Telephone Encounter (Signed)
°  Care Management   Follow Up Note   02/13/2022 Name: April Luna MRN: [id number] DOB: 11-08-1971   Referred by: Steele Sizer, MD Reason for referral : Care Coordination   An unsuccessful telephone outreach was attempted today. The patient was referred to the case management team for assistance with care management and care coordination.   Follow Up Plan: Telephone follow up appointment with care management team member scheduled for: 02/20/22  Elliot Gurney, [place] Worker  [facility] Care Management [phone]

## 2022-02-14 ENCOUNTER — Ambulatory Visit: Payer: Self-pay | Admitting: *Deleted

## 2022-02-14 DIAGNOSIS — R809 Proteinuria, unspecified: Secondary | ICD-10-CM

## 2022-02-14 DIAGNOSIS — E1129 Type 2 diabetes mellitus with other diabetic kidney complication: Secondary | ICD-10-CM

## 2022-02-14 DIAGNOSIS — F3110 Bipolar disorder, current episode manic without psychotic features, unspecified: Secondary | ICD-10-CM

## 2022-02-14 DIAGNOSIS — I10 Essential (primary) hypertension: Secondary | ICD-10-CM

## 2022-02-15 ENCOUNTER — Telehealth: Payer: Self-pay | Admitting: Family Medicine

## 2022-02-15 DIAGNOSIS — E1169 Type 2 diabetes mellitus with other specified complication: Secondary | ICD-10-CM

## 2022-02-15 DIAGNOSIS — E032 Hypothyroidism due to medicaments and other exogenous substances: Secondary | ICD-10-CM

## 2022-02-15 DIAGNOSIS — E785 Hyperlipidemia, unspecified: Secondary | ICD-10-CM

## 2022-02-15 NOTE — Chronic Care Management (AMB) (Signed)
Chronic Care Management    Clinical Social Work Note  02/15/2022 Name: April Luna MRN: [id number] DOB: 1971/03/06  April Luna is a 51 y.o. year old female who is a primary care patient of Steele Sizer, MD. The CCM team was consulted to assist the patient with chronic disease management and/or care coordination needs related to: [employer] .   Engaged with patient by telephone for follow up visit in response to provider referral for social work chronic care management and care coordination services.   Consent to Services:  The patient was given information about Chronic Care Management services, agreed to services, and gave verbal consent prior to initiation of services.  Please see initial visit note for detailed documentation.   Patient agreed to services and consent obtained.   Assessment: Review of patient past medical history, allergies, medications, and health status, including review of relevant consultants reports was performed today as part of a comprehensive evaluation and provision of chronic care management and care coordination services.     SDOH (Social Determinants of Health) assessments and interventions performed:    Advanced Directives Status: Not addressed in this encounter.  CCM Care Plan  Allergies  Allergen Reactions   Metformin And Related Nausea And Vomiting   Perphenazine Other (See Comments)    Tremors, muscle weakness, tongue swelling   Sulfa Antibiotics Other (See Comments)    GI distress    Abilify [Aripiprazole] Rash   Penicillins Rash    She has taken amoxicillin without problems    Outpatient Encounter Medications as of 02/14/2022  Medication Sig Note   albuterol (PROVENTIL) (2.5 MG/3ML) 0.083% nebulizer solution Take 3 mLs (2.5 mg total) by nebulization every 6 (six) hours as needed for wheezing or shortness of breath.    amantadine (SYMMETREL) 100 MG capsule Take 100 mg by mouth 2 (two) times daily.    blood glucose  meter kit and supplies Dispense based on patient and insurance preference. Use up to four times daily as directed. (FOR ICD-10 E10.9, E11.9).    Budeson-Glycopyrrol-Formoterol (BREZTRI AEROSPHERE) 160-9-4.8 MCG/ACT AERO Inhale 2 puffs into the lungs in the morning and at bedtime.    buPROPion (WELLBUTRIN XL) 150 MG 24 hr tablet Take 150 mg by mouth daily.    Cholecalciferol (VITAMIN D-3) 125 MCG (5000 UT) TABS Take 5,000 Units by mouth daily.    gabapentin (NEURONTIN) 300 MG capsule Take 300 mg by mouth 2 (two) times daily.    hydrOXYzine (VISTARIL) 50 MG capsule Take 50 mg by mouth 2 (two) times daily as needed for anxiety.    icosapent Ethyl (VASCEPA) 1 g capsule Take 2 capsules (2 g total) by mouth 2 (two) times daily.    INVEGA SUSTENNA 234 MG/1.5ML SUSY injection Inject 1.5 mLs into the muscle every 30 (thirty) days. Inject as directed once a month    levothyroxine (SYNTHROID) 75 MCG tablet Take 1 tablet (75 mcg total) by mouth daily before breakfast.    linaclotide (LINZESS) 145 MCG CAPS capsule Take 1 capsule (145 mcg total) by mouth daily before breakfast.    loratadine (CLARITIN) 10 MG tablet Take 10 mg by mouth daily.    losartan (COZAAR) 50 MG tablet Take 1 tablet (50 mg total) by mouth daily.    nicotine (NICOTROL) 10 MG inhaler Inhale 1 Cartridge (1 continuous puffing total) into the lungs as needed for smoking cessation. (Patient not taking: Reported on 01/24/2022) 07/07/2021: Pt plans to start using to help quit smoking  pantoprazole (PROTONIX) 40 MG tablet Take 1 tablet (40 mg total) by mouth daily.    rosuvastatin (CRESTOR) 40 MG tablet Take 1 tablet (40 mg total) by mouth daily.    sertraline (ZOLOFT) 100 MG tablet Take 200 mg by mouth daily.    No facility-administered encounter medications on file as of 02/14/2022.    Patient Active Problem List   Diagnosis Date Noted   Screening for colon cancer    Rectal mass    Polyp of ascending colon    Sleep apnea 08/11/2021    Plantar callus 10/06/2019   Acquired hallux limitus of both feet 10/06/2019   Type 2 diabetes mellitus with microalbuminuria, without long-term current use of insulin ([place]) 06/24/2019   Chronic obstructive pulmonary disease ([place]) 06/24/2019   Chronic constipation 06/24/2019   ASCUS of cervix with negative high risk HPV 09/05/2018   GERD without esophagitis 04/11/2018   Hyperlipidemia 04/11/2018   Hypertension 04/11/2018   Hypothyroidism 04/26/2015   Bipolar I disorder, most recent episode (or current) manic ([place]) 04/25/2015   Delirium due to another medical condition 04/25/2015   Cannabis abuse 04/25/2015    Conditions to be addressed/monitored:  HTN, DMII, and Bipolar Disorder; Mental Health Concerns  Care Plan : General Social Work (Adult)  Updates made by Vern Claude, LCSW since 02/15/2022 12:00 AM     Problem: [facility] AMB "PATIENT-SPECIFIC PROBLEM"   Note:   CARE PLAN ENTRY (see longitudinal plan of care for additional care plan information)  Current Barriers:  Patient with HTN, DM, and Bipolar Disorder in need of assistance with connection to community resources-patient states that she get's overwhelmed with the coordination of appointments and has a hard time following through Knowledge deficits and need for support, education and care coordination related to community resources support  Patient having difficulty keeping specialist appointments  Clinical Goal(s)  Over the next  90  days patient will be able to schedule and keep medical appointments as demonstrated by increased ability to schedule both the upcoming appointments and arranging transportation to get there. Over the next 90 days, patient will continue to work with the ACT Team (community agency) to maintain adherence to medical appointments and medication management  Interventions provided by LCSW:  Assessed patient's care coordination needs related to specialist needs and discussed ongoing care management follow  up  Gastroenterology appointment scheduled for 01/24/22 2:15pm [place], [place] -completed Patient discussed needed for follow up with Urology due to pain in Kidneys as well as Pulmonologist-it was confirmed that patient's provider will need to send a referral  Patient had a colonoscopy scheduled for 02/09/22-appointment completed mass confirmed invasive adenocarcinoma, follow up with [facility] scheduled for 02/17/22 patient confirms that she has transportation Emotional support provided, patient confirmed strong family support as well as family friends and there ACT Team. Additional appointments with specialist to be scheduled following follow up with [facility] and colonoscopy Validation and encouragement provided Positive reinforcement provided for motivation for close follow in regards to specialists needs.   Patient Self Care Activities & Deficits:  Patient is unable to independently navigate community resource options without care coordination support  Acknowledges deficits and is motivated to resolve concern  Patient is able to contact her Act Team  as discussed today for additional assistance with coordinating specialist appointments Unable to perform IADLs independently Performs ADL's independently Motivation for treatment  Please see past updates related to this goal by clicking on the "Past  Updates" button in the selected goal         Follow Up Plan: SW will follow up with patient by phone over the next 14 business days      [place], [place] Worker  [facility] Care Management [phone]

## 2022-02-15 NOTE — Patient Instructions (Signed)
Visit Information  Thank you for taking time to visit with me today. Please don't hesitate to contact me if I can be of assistance to you before our next scheduled telephone appointment.  Following are the goals we discussed today:   - arrange a ride through an agency 1 week before appointment - call to cancel if needed - keep a calendar with appointment dates  -patient has a scheduled appointment with the [facility] on 02/17/22  Our next appointment is by telephone on 03/03/22 at 1pm  Please call the care guide team at [phone] if you need to cancel or reschedule your appointment.   If you are experiencing a Mental Health or [place] or need someone to talk to, please call the Suicide and Crisis Lifeline: 988   Patient verbalizes understanding of instructions and care plan provided today and agrees to view in [place]. Active MyChart status confirmed with patient.    Telephone follow up appointment with care management team member scheduled for: 03/03/22  Elliot Gurney, [place] [employer] Worker  [facility] Care Management [phone]

## 2022-02-16 ENCOUNTER — Telehealth: Payer: Self-pay | Admitting: *Deleted

## 2022-02-16 NOTE — Telephone Encounter (Signed)
Pt is scheduled °

## 2022-02-16 NOTE — Telephone Encounter (Signed)
RN spoke with patient who verbalized understanding of appointment time tomorrow as well as directions to clinic and how to enter facility.

## 2022-02-17 ENCOUNTER — Ambulatory Visit
Admission: RE | Admit: 2022-02-17 | Discharge: 2022-02-17 | Disposition: A | Discharge status: Home / Self Care | Payer: Medicare Other | Source: Ambulatory Visit | Attending: Oncology | Admitting: Oncology

## 2022-02-17 ENCOUNTER — Encounter: Payer: Self-pay | Admitting: Oncology

## 2022-02-17 ENCOUNTER — Other Ambulatory Visit: Payer: Self-pay

## 2022-02-17 ENCOUNTER — Inpatient Hospital Stay: Payer: Medicare Other | Attending: Oncology | Admitting: Oncology

## 2022-02-17 ENCOUNTER — Inpatient Hospital Stay: Payer: Medicare Other

## 2022-02-17 VITALS — BP 109/83 | HR 95 | Temp 96.2°F | Resp 16 | Ht 60.0 in | Wt 149.1 lb

## 2022-02-17 DIAGNOSIS — R109 Unspecified abdominal pain: Secondary | ICD-10-CM | POA: Insufficient documentation

## 2022-02-17 DIAGNOSIS — Z720 Tobacco use: Secondary | ICD-10-CM | POA: Diagnosis not present

## 2022-02-17 DIAGNOSIS — C20 Malignant neoplasm of rectum: Secondary | ICD-10-CM

## 2022-02-17 DIAGNOSIS — D751 Secondary polycythemia: Secondary | ICD-10-CM

## 2022-02-17 DIAGNOSIS — F17210 Nicotine dependence, cigarettes, uncomplicated: Secondary | ICD-10-CM | POA: Insufficient documentation

## 2022-02-17 DIAGNOSIS — Z79899 Other long term (current) drug therapy: Secondary | ICD-10-CM | POA: Insufficient documentation

## 2022-02-17 DIAGNOSIS — Z7189 Other specified counseling: Secondary | ICD-10-CM

## 2022-02-17 DIAGNOSIS — R197 Diarrhea, unspecified: Secondary | ICD-10-CM | POA: Insufficient documentation

## 2022-02-17 DIAGNOSIS — Z803 Family history of malignant neoplasm of breast: Secondary | ICD-10-CM | POA: Diagnosis not present

## 2022-02-17 DIAGNOSIS — E785 Hyperlipidemia, unspecified: Secondary | ICD-10-CM | POA: Insufficient documentation

## 2022-02-17 DIAGNOSIS — F319 Bipolar disorder, unspecified: Secondary | ICD-10-CM | POA: Diagnosis not present

## 2022-02-17 DIAGNOSIS — I129 Hypertensive chronic kidney disease with stage 1 through stage 4 chronic kidney disease, or unspecified chronic kidney disease: Secondary | ICD-10-CM | POA: Insufficient documentation

## 2022-02-17 DIAGNOSIS — F209 Schizophrenia, unspecified: Secondary | ICD-10-CM | POA: Diagnosis not present

## 2022-02-17 DIAGNOSIS — Z800 Family history of malignant neoplasm of digestive organs: Secondary | ICD-10-CM | POA: Diagnosis not present

## 2022-02-17 DIAGNOSIS — J449 Chronic obstructive pulmonary disease, unspecified: Secondary | ICD-10-CM | POA: Insufficient documentation

## 2022-02-17 DIAGNOSIS — E039 Hypothyroidism, unspecified: Secondary | ICD-10-CM | POA: Diagnosis not present

## 2022-02-17 DIAGNOSIS — N189 Chronic kidney disease, unspecified: Secondary | ICD-10-CM | POA: Diagnosis not present

## 2022-02-17 DIAGNOSIS — G4730 Sleep apnea, unspecified: Secondary | ICD-10-CM | POA: Diagnosis not present

## 2022-02-17 DIAGNOSIS — E1122 Type 2 diabetes mellitus with diabetic chronic kidney disease: Secondary | ICD-10-CM | POA: Diagnosis not present

## 2022-02-17 DIAGNOSIS — K219 Gastro-esophageal reflux disease without esophagitis: Secondary | ICD-10-CM | POA: Diagnosis not present

## 2022-02-17 LAB — CBC WITH DIFFERENTIAL/PLATELET
Abs Immature Granulocytes: 0.13 10*3/uL — ABNORMAL HIGH (ref 0.00–0.07)
Basophils Absolute: 0.1 10*3/uL (ref 0.0–0.1)
Basophils Relative: 1 %
Eosinophils Absolute: 0.2 10*3/uL (ref 0.0–0.5)
Eosinophils Relative: 1 %
HCT: 47.3 % — ABNORMAL HIGH (ref 36.0–46.0)
Hemoglobin: 15.8 g/dL — ABNORMAL HIGH (ref 12.0–15.0)
Immature Granulocytes: 1 %
Lymphocytes Relative: 27 %
Lymphs Abs: 3 10*3/uL (ref 0.7–4.0)
MCH: 32.3 pg (ref 26.0–34.0)
MCHC: 33.4 g/dL (ref 30.0–36.0)
MCV: 96.7 fL (ref 80.0–100.0)
Monocytes Absolute: 0.7 10*3/uL (ref 0.1–1.0)
Monocytes Relative: 6 %
Neutro Abs: 7.1 10*3/uL (ref 1.7–7.7)
Neutrophils Relative %: 64 %
Platelets: 175 10*3/uL (ref 150–400)
RBC: 4.89 MIL/uL (ref 3.87–5.11)
RDW: 13.2 % (ref 11.5–15.5)
WBC: 11.1 10*3/uL — ABNORMAL HIGH (ref 4.0–10.5)
nRBC: 0 % (ref 0.0–0.2)

## 2022-02-17 LAB — COMPREHENSIVE METABOLIC PANEL
ALT: 14 U/L (ref 0–44)
AST: 16 U/L (ref 15–41)
Albumin: 4.4 g/dL (ref 3.5–5.0)
Alkaline Phosphatase: 116 U/L (ref 38–126)
Anion gap: 7 (ref 5–15)
BUN: 12 mg/dL (ref 6–20)
CO2: 33 mmol/L — ABNORMAL HIGH (ref 22–32)
Calcium: 9.8 mg/dL (ref 8.9–10.3)
Chloride: 97 mmol/L — ABNORMAL LOW (ref 98–111)
Creatinine, Ser: 1.11 mg/dL — ABNORMAL HIGH (ref 0.44–1.00)
GFR, Estimated: >60 mL/min (ref >60)
Glucose, Bld: 97 mg/dL (ref 70–99)
Potassium: 4.4 mmol/L (ref 3.5–5.1)
Sodium: 137 mmol/L (ref 135–145)
Total Bilirubin: 0.2 mg/dL — ABNORMAL LOW (ref 0.3–1.2)
Total Protein: 8.1 g/dL (ref 6.5–8.1)

## 2022-02-17 NOTE — Research (Addendum)
Second eligibility review completed by this nurse. Patient meets criteria for study enrollment.  Maxwell Marion, RN, BSN, [place] Clinical Research Nurse Lead 02/17/2022 3:11 PM     Trial: [employer] 2021-05 Patient April Luna was identified by Jeral Fruit, RN as a potential candidate for the above listed study.  This Clinical Research Nurse met with April Luna, [id number], on 02/17/22 in a manner and location that ensures patient privacy to discuss participation in the above listed research study.  Patient is Accompanied by RN care giver .  A copy of the informed consent document and separate HIPAA Authorization was provided to the patient.  Patient reads, speaks, and understands [place].    Patient was provided with the business card of this Nurse and encouraged to contact the research team with any questions.  Patient was provided the option of taking informed consent documents home to review and was encouraged to review at their convenience with their support network, including other care providers. Patient is comfortable with making a decision regarding study participation today.  As outlined in the informed consent form, this Nurse and [place] discussed the purpose of the research study, the investigational nature of the study, study procedures and requirements for study participation, potential risks and benefits of study participation, as well as alternatives to participation. This study is not blinded. The patient understands participation is voluntary and they may withdraw from study participation at any time.  This study does not involve randomization.  This study does not involve an investigational drug or device. This study does not involve a placebo. Patient understands enrollment is pending full eligibility review.  Second eligibility review was completed by Otilio Miu, RN, CRC and patient was deemed eligible.  Confidentiality and how the  patient's information will be used as part of study participation were discussed.  Patient was informed there is reimbursement provided for their time and effort spent on trial participation.  The patient is encouraged to discuss research study participation with their insurance provider to determine what costs they may incur as part of study participation, including research related injury.   All questions were answered to patient's satisfaction.  The informed consent and separate HIPAA Authorization was reviewed page by page.  The patient's mental and emotional status is appropriate to provide informed consent, and the patient verbalizes an understanding of study participation.  Patient has agreed to participate in the above listed research study and has voluntarily signed the informed consent version 14 and separate HIPAA Authorization, version 14  on 02/17/22 at 103PM.  The patient was provided with a copy of the signed informed consent form and separate HIPAA Authorization for their reference.  No study specific procedures were obtained prior to the signing of the informed consent document.  Approximately 30 minutes were spent with the patient reviewing the informed consent documents.  After obtaining informed consent patient, voluntarily signed the optional Release of Information form for use throughout trial participation.  Patient was assisted to the lab for her research study labs after her appointments were all made. Ciera, phlebotomist in the lab, obtained all vials for the study without any difficulty or patient complaints. Patient and care giver were given directions to get the [place] for her MRI scan today. Mauricio Po, CRS provided the patient with her gift card after her blood was drawn today with instructions on how to activate it. The patient was thanked for her time and participation in the protocol and encouraged to  call if she has any further questions / concerns or changes her mind  about her participation in the protocol.  Jeral Fruit, RN 02/17/22 1:53 PM

## 2022-02-17 NOTE — Progress Notes (Signed)
Met with Ms Bakke and [facility] representative. Introduced Therapist, nutritional and provided my contact information for future needs. Will follow up after imaging.

## 2022-02-17 NOTE — Progress Notes (Signed)
Hematology/Oncology Consult note [phone] [phone]         Patient Care Team: Steele Sizer, MD as PCP - General (Family Medicine) Sharmaine Base, MD as Referring Physician (Psychiatry) Bjorn Loser, MD as Consulting Physician (Urology) Germaine Pomfret, [name] (Pharmacist) Vern Claude,  as Social Worker Clent Jacks, RN as Oncology Nurse Navigator  REFERRING PROVIDER: Lin Landsman, MD  CHIEF COMPLAINTS/REASON FOR VISIT:  Evaluation of rectal cancer.  HISTORY OF PRESENTING ILLNESS:   April Luna is a  51 y.o.  female with PMH listed below was seen in consultation at the request of  Vanga, Tally Due, MD  for evaluation of rectal cancer  Patient has bipolar and schizophrenia.  She is accompanied with her psychiatry nurse.  Patient is married and lives at home with her husband and son. Patient was seen by gastroenterologist for evaluation of chronic diarrhea and abdominal bloating.  She also has a history of chronic constipation. 02/09/2022, patient had colonoscopy which showed renal mass 10 cm from anal verge.  5 mm polyp in ascending colon.  Removed and retrieved. Pathology showed rectal adenocarcinoma.  The polyp in the ascending colon is a tubular adenoma. She was referred to oncology for further evaluation and management.  Patient denies unintentional weight loss, abdominal pain, blood in the stool. Family history significant for multiple family members with cancer.  Review of Systems  Constitutional:  Negative for appetite change, chills, fatigue and fever.  HENT:   Negative for hearing loss and voice change.   Eyes:  Negative for eye problems.  Respiratory:  Negative for chest tightness and cough.   Cardiovascular:  Negative for chest pain.  Gastrointestinal:  Negative for abdominal distention, abdominal pain and blood in stool.  Endocrine: Negative for hot flashes.  Genitourinary:  Negative for  difficulty urinating and frequency.   Musculoskeletal:  Negative for arthralgias.  Skin:  Negative for itching and rash.  Neurological:  Negative for extremity weakness.  Hematological:  Negative for adenopathy.  Psychiatric/Behavioral:  Negative for confusion.    MEDICAL HISTORY:  Past Medical History:  Diagnosis Date   Allergy    pollen   Anxiety    Arthritis    right hip   Bipolar 1 disorder (HCC)    Chronic kidney disease    COPD (chronic obstructive pulmonary disease) (HCC)    Depression    Family history of adverse reaction to anesthesia    grand father had a stroke during anesthesia   GERD (gastroesophageal reflux disease)    History of kidney stones    Hyperlipidemia    Hypertension    Hypothyroidism    Pneumonia    Sleep apnea 08/11/2021   No CPAP   Type 2 diabetes mellitus with microalbuminuria, without long-term current use of insulin ([place]) 06/24/2019    SURGICAL HISTORY: Past Surgical History:  Procedure Laterality Date   BREAST BIOPSY Left 12/14/2021   [place] bx, venus marker, path pending   CESAREAN SECTION     COLONOSCOPY WITH PROPOFOL N/A 02/09/2022   Procedure: COLONOSCOPY WITH PROPOFOL;  Surgeon: Lin Landsman, MD;  Location: [place];  Service: Endoscopy;  Laterality: N/A;  sleep apnea   CYSTOSCOPY W/ RETROGRADES Left 11/08/2018   Procedure: CYSTOSCOPY WITH RETROGRADE PYELOGRAM;  Surgeon: Billey Co, MD;  Location: [facility];  Service: Urology;  Laterality: Left;   CYSTOSCOPY/URETEROSCOPY/HOLMIUM LASER/STENT PLACEMENT Left 11/08/2018   Procedure: CYSTOSCOPY/URETEROSCOPY/HOLMIUM LASER/STENT PLACEMENT;  Surgeon: Billey Co, MD;  Location: [facility];  Service: Urology;  Laterality: Left;   MOUTH SURGERY     wisdom teeth extraction   POLYPECTOMY N/A 02/09/2022   Procedure: POLYPECTOMY;  Surgeon: Lin Landsman, MD;  Location: [place];  Service: Endoscopy;  Laterality: N/A;    SOCIAL HISTORY: Social History    Socioeconomic History   Marital status: Married    Spouse name: Montine Circle   Number of children: 1   Years of education: 12   Highest education level: High school graduate  Occupational History   Occupation: unemployed    Comment: disabled  Tobacco Use   Smoking status: Every Day    Packs/day: 1.00    Years: 34.00    Pack years: 34.00    Types: Cigarettes    Start date: 05/13/1985   Smokeless tobacco: Never   Tobacco comments:    1 PPD 02/24/2021 (started around age 49)  75 Use   Vaping Use: Former   Start date: 05/25/2018   Quit date: 09/24/2018  Substance and Sexual Activity   Alcohol use: Yes    Alcohol/week: 4.0 standard drinks    Types: 4 Glasses of wine per week    Comment: occasional   Drug use: Not Currently    Types: Marijuana    Comment: none since August 2019   Sexual activity: Not Currently    Birth control/protection: Post-menopausal  Other Topics Concern   Not on file  Social History Narrative   Not on file   Social Determinants of Health   Financial Resource Strain: Medium Risk   Difficulty of Paying Living Expenses: Somewhat hard  Food Insecurity: No Food Insecurity   Worried About Charity fundraiser in the Last Year: Never true   Ran Out of Food in the Last Year: Never true  Transportation Needs: Unmet Transportation Needs   Lack of Transportation (Medical): Yes   Lack of Transportation (Non-Medical): Yes  Physical Activity: Inactive   Days of Exercise per Week: 0 days   Minutes of Exercise per Session: 0 min  Stress: No Stress Concern Present   Feeling of Stress : Not at all  Social Connections: Moderately Integrated   Frequency of Communication with Friends and Family: More than three times a week   Frequency of Social Gatherings with Friends and Family: Twice a week   Attends Religious Services: More than 4 times per year   Active Member of [employer] or Organizations: No   Attends Music therapist: Never   Marital  Status: Married  Human resources officer Violence: Not At Risk   Fear of Current or Ex-Partner: No   Emotionally Abused: No   Physically Abused: No   Sexually Abused: No    FAMILY HISTORY: Family History  Problem Relation Age of Onset   Depression Mother    Anxiety disorder Mother    Diabetes Mother    Hypertension Mother    Hyperlipidemia Mother    Cancer Mother    Uterine cancer Mother    Cervical cancer Mother    Cancer Father        colon cancer   Colon cancer Father    Depression Brother    Anxiety disorder Brother    Diabetes Mellitus II Maternal Grandmother    Hypercholesterolemia Maternal Grandmother    Cancer Paternal Grandmother    Diabetes Paternal Grandmother    Melanoma Paternal Grandmother    Stomach cancer Paternal Grandmother    Breast cancer Maternal Great-grandmother     ALLERGIES:  is allergic  to metformin and related, perphenazine, sulfa antibiotics, abilify [aripiprazole], and penicillins.  MEDICATIONS:  Current Outpatient Medications  Medication Sig Dispense Refill   albuterol (PROVENTIL) (2.5 MG/3ML) 0.083% nebulizer solution Take 3 mLs (2.5 mg total) by nebulization every 6 (six) hours as needed for wheezing or shortness of breath. 150 mL 1   amantadine (SYMMETREL) 100 MG capsule Take 100 mg by mouth 2 (two) times daily.     blood glucose meter kit and supplies Dispense based on patient and insurance preference. Use up to four times daily as directed. (FOR ICD-10 E10.9, E11.9). 1 each 0   Budeson-Glycopyrrol-Formoterol (BREZTRI AEROSPHERE) 160-9-4.8 MCG/ACT AERO Inhale 2 puffs into the lungs in the morning and at bedtime. 5.9 g 11   buPROPion (WELLBUTRIN XL) 150 MG 24 hr tablet Take 150 mg by mouth daily.     Cholecalciferol (VITAMIN D-3) 125 MCG (5000 UT) TABS Take 5,000 Units by mouth daily. 30 tablet 1   gabapentin (NEURONTIN) 300 MG capsule Take 300 mg by mouth 2 (two) times daily.     hydrOXYzine (VISTARIL) 50 MG capsule Take 50 mg by mouth 3 (three)  times daily as needed for anxiety.     icosapent Ethyl (VASCEPA) 1 g capsule TAKE 2 CAPSULES BY MOUTH 2 TIMES DAILY 120 capsule 0   INVEGA SUSTENNA 234 MG/1.5ML SUSY injection Inject 1.5 mLs into the muscle every 30 (thirty) days. Inject as directed once a month     levothyroxine (SYNTHROID) 75 MCG tablet TAKE 1 TABLET BY MOUTH DAILY BEFORE BREAKFAST 30 tablet 0   linaclotide (LINZESS) 145 MCG CAPS capsule Take 1 capsule (145 mcg total) by mouth daily before breakfast. 30 capsule 2   loratadine (CLARITIN) 10 MG tablet Take 10 mg by mouth daily.     losartan (COZAAR) 50 MG tablet Take 1 tablet (50 mg total) by mouth daily. 30 tablet 3   pantoprazole (PROTONIX) 40 MG tablet Take 1 tablet (40 mg total) by mouth daily. 30 tablet 3   rosuvastatin (CRESTOR) 40 MG tablet TAKE ONE TABLET BY MOUTH DAILY 90 tablet 0   sertraline (ZOLOFT) 100 MG tablet Take 200 mg by mouth daily.     nicotine (NICOTROL) 10 MG inhaler Inhale 1 Cartridge (1 continuous puffing total) into the lungs as needed for smoking cessation. (Patient not taking: Reported on 01/24/2022) 36 each 3   No current facility-administered medications for this visit.     PHYSICAL EXAMINATION: ECOG PERFORMANCE STATUS: 0 - Asymptomatic Vitals:   02/17/22 1234  BP: 109/83  Pulse: 95  Resp: 16  Temp: (!) 96.2 F (35.7 C)  SpO2: 97%   Filed Weights   02/17/22 1234  Weight: 149 lb 1.6 oz (67.6 kg)    Physical Exam Constitutional:      General: She is not in acute distress. HENT:     Head: Normocephalic and atraumatic.  Eyes:     General: No scleral icterus. Cardiovascular:     Rate and Rhythm: Normal rate and regular rhythm.     Heart sounds: Normal heart sounds.  Pulmonary:     Effort: Pulmonary effort is normal. No respiratory distress.     Breath sounds: No wheezing.  Abdominal:     General: Bowel sounds are normal. There is no distension.     Palpations: Abdomen is soft.  Musculoskeletal:        General: No deformity.  Normal range of motion.     Cervical back: Normal range of motion and neck supple.  Skin:  General: Skin is warm and dry.     Findings: No erythema or rash.  Neurological:     Mental Status: She is alert and oriented to person, place, and time. Mental status is at baseline.     Cranial Nerves: No cranial nerve deficit.     Coordination: Coordination normal.  Psychiatric:        Mood and Affect: Mood normal.    LABORATORY DATA:  I have reviewed the data as listed Lab Results  Component Value Date   WBC 11.1 (H) 02/17/2022   HGB 15.8 (H) 02/17/2022   HCT 47.3 (H) 02/17/2022   MCV 96.7 02/17/2022   PLT 175 02/17/2022   Recent Labs    06/20/21 1234 08/11/21 1031 02/17/22 1310  NA 139 140 137  K 3.8 3.8 4.4  CL 99 102 97*  CO2 31 29 33*  GLUCOSE 91 205* 97  BUN 10 6* 12  CREATININE 0.97 0.94 1.11*  CALCIUM 10.7* 8.9 9.8  GFRNONAA 69  --  >60  GFRAA 80  --   --   PROT 6.9 6.6 8.1  ALBUMIN  --   --  4.4  AST _0 ALT _1 ALKPHOS  --   --  116  BILITOT 0.5 0.2 0.2*   Iron/TIBC/Ferritin/ %Sat No results found for: IRON, TIBC, FERRITIN, IRONPCTSAT    RADIOGRAPHIC STUDIES: I have personally reviewed the radiological images as listed and agreed with the findings in the report. No results found.    ASSESSMENT & PLAN:  1. Rectal cancer ([place])   2. Goals of care, counseling/discussion   3. Tobacco use   4. Erythrocytosis    #Pathology report was reviewed and discussed with patient. Recommend staging with CT chest abdomen with contrast and MRI pelvis rectal protocol. Discussed with patient that depending on the staging, patient may proceed with upfront surgical resection or new adjuvant chemotherapy and radiation followed by surgery. Check CBC, CMP, CEA.  Tobacco use, smoke cessation was discussed with patient Erythrocytosis, hemoglobin 15.8, likely secondary to smoking.  Monitor closely.   Orders Placed This Encounter  Procedures   CT Chest W  Contrast    Standing Status:   Future    Standing Expiration Date:   02/17/2023    Order Specific Question:   If indicated for the ordered procedure, I authorize the administration of contrast media per Radiology protocol    Answer:   Yes    Order Specific Question:   Is patient pregnant?    Answer:   No    Order Specific Question:   Preferred imaging location?    Answer:   [facility]   CT Abdomen W Contrast    Standing Status:   Future    Standing Expiration Date:   02/17/2023    Order Specific Question:   If indicated for the ordered procedure, I authorize the administration of contrast media per Radiology protocol    Answer:   Yes    Order Specific Question:   Is patient pregnant?    Answer:   No    Order Specific Question:   Preferred imaging location?    Answer:   [facility]    Order Specific Question:   Is Oral Contrast requested for this exam?    Answer:   Yes, Per Radiology protocol   MR Pelvis Wo Contrast    Standing Status:   Future    Number of Occurrences:   1  Standing Expiration Date:   02/17/2023    Order Specific Question:   What is the patient's sedation requirement?    Answer:   No Sedation    Order Specific Question:   Does the patient have a pacemaker or implanted devices?    Answer:   No    Order Specific Question:   Preferred imaging location?    Answer:   [facility] (table limit - 550lbs)    Order Specific Question:   Call Results- Best Contact Number?    Answer:   [phone] / do not hold patieont   CBC with Differential/Platelet    Standing Status:   Future    Number of Occurrences:   1    Standing Expiration Date:   02/17/2023   Comprehensive metabolic panel    Standing Status:   Future    Number of Occurrences:   1    Standing Expiration Date:   02/17/2023   CEA    Standing Status:   Future    Number of Occurrences:   1    Standing Expiration Date:   02/17/2023   Ambulatory referral to Genetics    Referral Priority:   Routine     Referral Type:   Consultation    Referral Reason:   Specialty Services Required    Number of Visits Requested:   1    All questions were answered. The patient knows to call the clinic with any problems questions or concerns.  cc Lin Landsman, MD    Return of visit: TBD Thank you for this kind referral and the opportunity to participate in the care of this patient. A copy of today's note is routed to referring provider   [name], MD, PhD [facility] Hematology Oncology 02/17/2022

## 2022-02-18 LAB — CEA: CEA: 3.6 ng/mL (ref 0.0–4.7)

## 2022-02-20 ENCOUNTER — Telehealth: Payer: Medicare Other

## 2022-02-21 ENCOUNTER — Ambulatory Visit: Payer: Self-pay | Admitting: *Deleted

## 2022-02-21 DIAGNOSIS — I10 Essential (primary) hypertension: Secondary | ICD-10-CM

## 2022-02-21 DIAGNOSIS — E1129 Type 2 diabetes mellitus with other diabetic kidney complication: Secondary | ICD-10-CM

## 2022-02-21 DIAGNOSIS — F3110 Bipolar disorder, current episode manic without psychotic features, unspecified: Secondary | ICD-10-CM

## 2022-02-21 DIAGNOSIS — R809 Proteinuria, unspecified: Secondary | ICD-10-CM

## 2022-02-21 NOTE — Chronic Care Management (AMB) (Signed)
Chronic Care Management    Clinical Social Work Note  02/21/2022 Name: April Luna MRN: [id number] DOB: 1971-11-01  April Luna is a 51 y.o. year old female who is a primary care patient of Steele Sizer, MD. The CCM team was consulted to assist the patient with chronic disease management and/or care coordination needs related to: Transportation Needs  and [employer] .   Engaged with patient by telephone for follow up visit in response to provider referral for social work chronic care management and care coordination services.   Consent to Services:  The patient was given information about Chronic Care Management services, agreed to services, and gave verbal consent prior to initiation of services.  Please see initial visit note for detailed documentation.   Patient agreed to services and consent obtained.   Assessment: Review of patient past medical history, allergies, medications, and health status, including review of relevant consultants reports was performed today as part of a comprehensive evaluation and provision of chronic care management and care coordination services.     SDOH (Social Determinants of Health) assessments and interventions performed:    Advanced Directives Status: Not addressed in this encounter.  CCM Care Plan  Allergies  Allergen Reactions   Metformin And Related Nausea And Vomiting   Perphenazine Other (See Comments)    Tremors, muscle weakness, tongue swelling   Sulfa Antibiotics Other (See Comments)    GI distress    Abilify [Aripiprazole] Rash   Penicillins Rash    She has taken amoxicillin without problems    Outpatient Encounter Medications as of 02/21/2022  Medication Sig Note   albuterol (PROVENTIL) (2.5 MG/3ML) 0.083% nebulizer solution Take 3 mLs (2.5 mg total) by nebulization every 6 (six) hours as needed for wheezing or shortness of breath.    amantadine (SYMMETREL) 100 MG capsule Take 100 mg by mouth 2 (two)  times daily.    blood glucose meter kit and supplies Dispense based on patient and insurance preference. Use up to four times daily as directed. (FOR ICD-10 E10.9, E11.9).    Budeson-Glycopyrrol-Formoterol (BREZTRI AEROSPHERE) 160-9-4.8 MCG/ACT AERO Inhale 2 puffs into the lungs in the morning and at bedtime.    buPROPion (WELLBUTRIN XL) 150 MG 24 hr tablet Take 150 mg by mouth daily.    Cholecalciferol (VITAMIN D-3) 125 MCG (5000 UT) TABS Take 5,000 Units by mouth daily.    gabapentin (NEURONTIN) 300 MG capsule Take 300 mg by mouth 2 (two) times daily.    hydrOXYzine (VISTARIL) 50 MG capsule Take 50 mg by mouth 3 (three) times daily as needed for anxiety.    icosapent Ethyl (VASCEPA) 1 g capsule TAKE 2 CAPSULES BY MOUTH 2 TIMES DAILY    INVEGA SUSTENNA 234 MG/1.5ML SUSY injection Inject 1.5 mLs into the muscle every 30 (thirty) days. Inject as directed once a month    levothyroxine (SYNTHROID) 75 MCG tablet TAKE 1 TABLET BY MOUTH DAILY BEFORE BREAKFAST    linaclotide (LINZESS) 145 MCG CAPS capsule Take 1 capsule (145 mcg total) by mouth daily before breakfast.    loratadine (CLARITIN) 10 MG tablet Take 10 mg by mouth daily.    losartan (COZAAR) 50 MG tablet Take 1 tablet (50 mg total) by mouth daily.    nicotine (NICOTROL) 10 MG inhaler Inhale 1 Cartridge (1 continuous puffing total) into the lungs as needed for smoking cessation. (Patient not taking: Reported on 01/24/2022) 07/07/2021: Pt plans to start using to help quit smoking    pantoprazole (PROTONIX) 40 MG  tablet Take 1 tablet (40 mg total) by mouth daily.    rosuvastatin (CRESTOR) 40 MG tablet TAKE ONE TABLET BY MOUTH DAILY    sertraline (ZOLOFT) 100 MG tablet Take 200 mg by mouth daily.    No facility-administered encounter medications on file as of 02/21/2022.    Patient Active Problem List   Diagnosis Date Noted   Screening for colon cancer    Rectal mass    Polyp of ascending colon    Sleep apnea 08/11/2021   Plantar callus  10/06/2019   Acquired hallux limitus of both feet 10/06/2019   Type 2 diabetes mellitus with microalbuminuria, without long-term current use of insulin ([place]) 06/24/2019   Chronic obstructive pulmonary disease ([place]) 06/24/2019   Chronic constipation 06/24/2019   ASCUS of cervix with negative high risk HPV 09/05/2018   GERD without esophagitis 04/11/2018   Hyperlipidemia 04/11/2018   Hypertension 04/11/2018   Hypothyroidism 04/26/2015   Bipolar I disorder, most recent episode (or current) manic ([place]) 04/25/2015   Delirium due to another medical condition 04/25/2015   Cannabis abuse 04/25/2015    Conditions to be addressed/monitored: HTN, DMII, and Bipolar Disorder; Mental Health Concerns   There are no care plans that you recently modified to display for this patient.    Follow Up Plan: SW will follow up with patient by phone over the next 14 business days      [place], [place] Worker  [facility] Care Management [phone]

## 2022-02-21 NOTE — Patient Instructions (Signed)
Visit Information  Thank you for taking time to visit with me today. Please don't hesitate to contact me if I can be of assistance to you before our next scheduled telephone appointment.  Following are the goals we discussed today:   - arrange a ride through an agency 1 week before appointment - call to cancel if needed - keep a calendar with appointment dates  -patient to utilize the ACT team and her transportation benefit on her [place] to arrange transportation to upcoming medical appointments  Our next appointment is by telephone on 03/03/22 at 1pm  Please call the care guide team at [phone] if you need to cancel or reschedule your appointment.   If you are experiencing a Mental Health or [place] or need someone to talk to, please call the Suicide and Crisis Lifeline: 988   Patient verbalizes understanding of instructions and care plan provided today and agrees to view in [place]. Active MyChart status confirmed with patient.    Telephone follow up appointment with care management team member scheduled for: 03/03/22  Elliot Gurney, [place] Worker  [facility] Care Management [phone]

## 2022-02-23 ENCOUNTER — Telehealth: Payer: Self-pay

## 2022-02-23 ENCOUNTER — Other Ambulatory Visit: Payer: Self-pay

## 2022-02-23 DIAGNOSIS — C20 Malignant neoplasm of rectum: Secondary | ICD-10-CM

## 2022-02-23 NOTE — Addendum Note (Signed)
Addended by: Carl Best H on: 02/23/2022 08:39 AM   Modules accepted: Orders

## 2022-02-23 NOTE — Telephone Encounter (Signed)
-----   Message from [name], MD sent at 02/22/2022 11:02 PM EST ----- ?Is the MRI pelvis the earliest available spot? Ok to order MRI as stat ? ?

## 2022-02-23 NOTE — Telephone Encounter (Signed)
Message sent to schedulers to get STAT MRI. ?

## 2022-02-26 ENCOUNTER — Other Ambulatory Visit: Payer: Self-pay

## 2022-02-26 ENCOUNTER — Ambulatory Visit
Admission: RE | Admit: 2022-02-26 | Discharge: 2022-02-26 | Disposition: A | Discharge status: Home / Self Care | Payer: Medicare Other | Source: Ambulatory Visit | Attending: Oncology | Admitting: Oncology

## 2022-02-26 ENCOUNTER — Ambulatory Visit: Admission: RE | Admit: 2022-02-26 | Payer: Medicare Other | Source: Ambulatory Visit

## 2022-02-26 DIAGNOSIS — C20 Malignant neoplasm of rectum: Secondary | ICD-10-CM | POA: Diagnosis not present

## 2022-02-28 ENCOUNTER — Ambulatory Visit (INDEPENDENT_AMBULATORY_CARE_PROVIDER_SITE_OTHER): Payer: Medicare Other | Admitting: *Deleted

## 2022-02-28 DIAGNOSIS — R809 Proteinuria, unspecified: Secondary | ICD-10-CM

## 2022-02-28 DIAGNOSIS — F3110 Bipolar disorder, current episode manic without psychotic features, unspecified: Secondary | ICD-10-CM

## 2022-02-28 DIAGNOSIS — E1129 Type 2 diabetes mellitus with other diabetic kidney complication: Secondary | ICD-10-CM

## 2022-02-28 DIAGNOSIS — I10 Essential (primary) hypertension: Secondary | ICD-10-CM

## 2022-03-01 NOTE — Chronic Care Management (AMB) (Signed)
?Chronic Care Management  ? ? Clinical Social Work Note ? ?03/01/2022 ?Name: April Luna MRN: [id number] DOB: 1971/03/24 ? ?April Luna is a 51 y.o. year old female who is a primary care patient of Steele Sizer, MD. The CCM team was consulted to assist the patient with chronic disease management and/or care coordination needs related to: [employer] .  ? ?Engaged with patient by telephone for follow up visit in response to provider referral for social work chronic care management and care coordination services.  ? ?Consent to Services:  ?The patient was given information about Chronic Care Management services, agreed to services, and gave verbal consent prior to initiation of services.  Please see initial visit note for detailed documentation.  ? ?Patient agreed to services and consent obtained.  ? ?Assessment: Review of patient past medical history, allergies, medications, and health status, including review of relevant consultants reports was performed today as part of a comprehensive evaluation and provision of chronic care management and care coordination services.    ? ?SDOH (Social Determinants of Health) assessments and interventions performed:   ? ?Advanced Directives Status: Not addressed in this encounter. ? ?CCM Care Plan ? ?Allergies  ?Allergen Reactions  ? Metformin And Related Nausea And Vomiting  ? Perphenazine Other (See Comments)  ?  Tremors, muscle weakness, tongue swelling  ? Sulfa Antibiotics Other (See Comments)  ?  GI distress ?  ? Abilify [Aripiprazole] Rash  ? Penicillins Rash  ?  She has taken amoxicillin without problems  ? ? ?Outpatient Encounter Medications as of 02/28/2022  ?Medication Sig Note  ? albuterol (PROVENTIL) (2.5 MG/3ML) 0.083% nebulizer solution Take 3 mLs (2.5 mg total) by nebulization every 6 (six) hours as needed for wheezing or shortness of breath.   ? amantadine (SYMMETREL) 100 MG capsule Take 100 mg by mouth 2 (two) times daily.   ? blood glucose  meter kit and supplies Dispense based on patient and insurance preference. Use up to four times daily as directed. (FOR ICD-10 E10.9, E11.9).   ? Budeson-Glycopyrrol-Formoterol (BREZTRI AEROSPHERE) 160-9-4.8 MCG/ACT AERO Inhale 2 puffs into the lungs in the morning and at bedtime.   ? buPROPion (WELLBUTRIN XL) 150 MG 24 hr tablet Take 150 mg by mouth daily.   ? Cholecalciferol (VITAMIN D-3) 125 MCG (5000 UT) TABS Take 5,000 Units by mouth daily.   ? gabapentin (NEURONTIN) 300 MG capsule Take 300 mg by mouth 2 (two) times daily.   ? hydrOXYzine (VISTARIL) 50 MG capsule Take 50 mg by mouth 3 (three) times daily as needed for anxiety.   ? icosapent Ethyl (VASCEPA) 1 g capsule TAKE 2 CAPSULES BY MOUTH 2 TIMES DAILY   ? INVEGA SUSTENNA 234 MG/1.5ML SUSY injection Inject 1.5 mLs into the muscle every 30 (thirty) days. Inject as directed once a month   ? levothyroxine (SYNTHROID) 75 MCG tablet TAKE 1 TABLET BY MOUTH DAILY BEFORE BREAKFAST   ? linaclotide (LINZESS) 145 MCG CAPS capsule Take 1 capsule (145 mcg total) by mouth daily before breakfast.   ? loratadine (CLARITIN) 10 MG tablet Take 10 mg by mouth daily.   ? losartan (COZAAR) 50 MG tablet Take 1 tablet (50 mg total) by mouth daily.   ? nicotine (NICOTROL) 10 MG inhaler Inhale 1 Cartridge (1 continuous puffing total) into the lungs as needed for smoking cessation. (Patient not taking: Reported on 01/24/2022) 07/07/2021: Pt plans to start using to help quit smoking ?  ? pantoprazole (PROTONIX) 40 MG tablet Take 1 tablet (  40 mg total) by mouth daily.   ? rosuvastatin (CRESTOR) 40 MG tablet TAKE ONE TABLET BY MOUTH DAILY   ? sertraline (ZOLOFT) 100 MG tablet Take 200 mg by mouth daily.   ? ?No facility-administered encounter medications on file as of 02/28/2022.  ? ? ?Patient Active Problem List  ? Diagnosis Date Noted  ? Screening for colon cancer   ? Rectal mass   ? Polyp of ascending colon   ? Sleep apnea 08/11/2021  ? Plantar callus 10/06/2019  ? Acquired hallux  limitus of both feet 10/06/2019  ? Type 2 diabetes mellitus with microalbuminuria, without long-term current use of insulin (HCC) 06/24/2019  ? Chronic obstructive pulmonary disease (HCC) 06/24/2019  ? Chronic constipation 06/24/2019  ? ASCUS of cervix with negative high risk HPV 09/05/2018  ? GERD without esophagitis 04/11/2018  ? Hyperlipidemia 04/11/2018  ? Hypertension 04/11/2018  ? Hypothyroidism 04/26/2015  ? Bipolar I disorder, most recent episode (or current) manic (HCC) 04/25/2015  ? Delirium due to another medical condition 04/25/2015  ? Cannabis abuse 04/25/2015  ? ? ?Conditions to be addressed/monitored:  ?HTN, DMII, and Bipolar Disorder; Mental Health Concerns  ?Care Plan : General Social Work (Adult)  ?Updates made by Wenda Overland, LCSW since 03/01/2022 12:00 AM  ?  ? ?Problem: [facility] AMB "PATIENT-SPECIFIC PROBLEM"   ?Note:   ?CARE PLAN ENTRY ?(see longitudinal plan of care for additional care plan information) ? ?Current Barriers:  ?Patient with HTN, DM, and Bipolar Disorder in need of assistance with connection to community resources-patient states that she get's overwhelmed with the coordination of appointments and has a hard time following through ?Knowledge deficits and need for support, education and care coordination related to community resources support  ?Patient having difficulty keeping specialist appointments ? ?Clinical Goal(s)  ?Over the next  90  days patient will be able to schedule and keep medical appointments as demonstrated by increased ability to schedule both the upcoming appointments and arranging transportation to get there. ?Over the next 90 days, patient will continue to work with the ACT Team (community agency) to maintain adherence to medical appointments and medication management ? ?Interventions provided by LCSW:  ?Assessed patient's care coordination needs related to specialist needs and discussed ongoing care management follow up  ?Patient confirmed completion of rectal  MRI-results pending ?Patient requesting a review of medical appointments to ensure transportation ?Confirmed that ACT Team social worker agreeabe to assisting patient with transportation to medical appointments ?Patient reminded of [employer] transportation benefit and [place] transportation ?Emotional support provided, patient confirmed strong family and ACT Team support in regards to her diagnosis and transportation needs ?Emotional support and encouragement provided ?Positive reinforcement provided for motivation for close follow in regards to specialists needs. ? ? ?Patient Self Care Activities & Deficits:  ?Patient is unable to independently navigate community resource options without care coordination support  ?Acknowledges deficits and is motivated to resolve concern  ?Patient is able to contact her Act Team  as discussed today for additional assistance with coordinating specialist appointments ?Unable to perform IADLs independently ?Performs ADL's independently ?Motivation for treatment ? ?Please see past updates related to this goal by clicking on the "Past Updates" button in the selected goal  ? ? ?  ?  ? ?Follow Up Plan: SW will follow up with patient by phone over the next 30 business days ?     ?Dillyn Joaquin, LCSW ?Clinical Social Worker  ?[facility] Care Management ?[phone] ? ? ? ?

## 2022-03-01 NOTE — Patient Instructions (Signed)
Visit Information ? ?Thank you for taking time to visit with me today. Please don't hesitate to contact me if I can be of assistance to you before our next scheduled telephone appointment. ? ?Following are the goals we discussed today:  ? arrange a ride through an agency 1 week before appointment ?- call to cancel if needed ?- keep a calendar with appointment dates  ?-patient to utilize the ACT team and her transportation benefit on her [facility] plan to arrange transportation to upcoming medical appointments  ? ?Our next appointment is by telephone on 03/27/22 at 1pm ? Please call the care guide team at [phone] if you need to cancel or reschedule your appointment.  ? ?If you are experiencing a Mental Health or [place] or need someone to talk to, please call the Suicide and Crisis Lifeline: 988  ? ?Patient verbalizes understanding of instructions and care plan provided today and agrees to view in [place]. Active MyChart status confirmed with patient.   ? ?Telephone follow up appointment with care management team member scheduled for: 03/25/22 ? ?Bartosz Luginbill, LCSW ?Clinical Social Worker  ?[facility] Care Management ?[phone] ? ?

## 2022-03-02 ENCOUNTER — Ambulatory Visit: Payer: Medicare Other

## 2022-03-02 ENCOUNTER — Other Ambulatory Visit: Payer: Self-pay

## 2022-03-02 ENCOUNTER — Ambulatory Visit
Admission: RE | Admit: 2022-03-02 | Discharge: 2022-03-02 | Disposition: A | Discharge status: Home / Self Care | Payer: Medicare Other | Source: Ambulatory Visit | Attending: Oncology | Admitting: Oncology

## 2022-03-02 DIAGNOSIS — C20 Malignant neoplasm of rectum: Secondary | ICD-10-CM | POA: Diagnosis not present

## 2022-03-02 MED ORDER — IOHEXOL 300 MG/ML  SOLN
100.0000 mL | Freq: Once | INTRAMUSCULAR | Status: AC | PRN
  Administered 2022-03-02: 15:00:00 100 mL via INTRAVENOUS

## 2022-03-03 ENCOUNTER — Telehealth: Payer: Self-pay

## 2022-03-03 ENCOUNTER — Ambulatory Visit: Payer: Medicare Other

## 2022-03-03 ENCOUNTER — Telehealth: Payer: Medicare Other

## 2022-03-03 NOTE — Telephone Encounter (Signed)
Spoke to Ms. Cahn. We have arranged an appointment with Dr. Dema Severin at the [facility] location of [place] on 03/07/22 at [place]. She will need to arrive 30 minutes early. Transportation has been arranged. Dr. Tasia Catchings will see her 2-3 weeks following surgery. ?

## 2022-03-03 NOTE — Telephone Encounter (Signed)
Spoke to April Luna and provided results of MRI/CT. Dr. Tasia Catchings would like her to see a colorectal surgeron and see her back 2-3 weeks following surgery. April Luna does not have any transportation. Will see if cancer center will be able to offer transportation. I will call her once I speak with transportation. ?

## 2022-03-03 NOTE — Telephone Encounter (Signed)
[facility] will be in [place] on 3/14. I have attempted to call April Luna to see if she can come this day. Cancer center can transport. No answer x2. No voicemail. ?

## 2022-03-07 ENCOUNTER — Telehealth: Payer: Self-pay

## 2022-03-07 NOTE — Telephone Encounter (Signed)
Spoke with April Luna this am. Her transportation has arrived for her appointment with Dr. Dema Severin at the [facility] location. Will follow for recommendations. ?

## 2022-03-08 ENCOUNTER — Other Ambulatory Visit: Payer: Self-pay

## 2022-03-08 ENCOUNTER — Inpatient Hospital Stay: Payer: Medicare Other | Attending: Oncology | Admitting: Hospice and Palliative Medicine

## 2022-03-08 DIAGNOSIS — C20 Malignant neoplasm of rectum: Secondary | ICD-10-CM

## 2022-03-08 NOTE — Progress Notes (Signed)
Multidisciplinary Oncology Council Documentation ? ?SIGNE TACKITT was presented by our [facility] on 03/08/2022, which included representatives from:  ?Palliative Care ?Dietitian  ?Physical/Occupational Therapist ?Nurse Navigator ?Genetics ?Speech Therapist ?Social work ?Survivorship RN ?Financial Navigator ?Research RN ? ? ?Montia currently presents with history of rectal cancer. ? ?We reviewed previous medical and familial history, history of present illness, and recent lab results along with all available histopathologic and imaging studies. The [place] considered available treatment options and made the following recommendations/referrals: ? ?None at this time ? ?The MOC is a meeting of clinicians from various specialty areas who evaluate and discuss patients for whom a multidisciplinary approach is being considered. Final determinations in the plan of care are those of the provider(s).  ? ?Today's extended care, comprehensive team conference, Jalonda was not present for the discussion and was not examined.   ?

## 2022-03-09 ENCOUNTER — Telehealth: Payer: Self-pay

## 2022-03-09 NOTE — Telephone Encounter (Signed)
Received call from April Luna with question regarding seeking a second surgical opinion. In talking to her she has further questions regarding her surgery and is not comfortable with having a permanent colostomy, stopping smoking, and having surgery scheduled so quickly. I have offered her referrals to other facilities with colorectal surgeons. Transportation is an issue to other sites. In the end, I have encouraged her to call and arranged a second appointment and take someone with her to the appointment that can help get all of her questions answered. I have provided the phone number to [facility] and encouraged her to call with any further questions. ?

## 2022-03-14 ENCOUNTER — Encounter: Payer: Self-pay | Admitting: Licensed Clinical Social Worker

## 2022-03-14 ENCOUNTER — Other Ambulatory Visit: Payer: Self-pay

## 2022-03-14 ENCOUNTER — Inpatient Hospital Stay (HOSPITAL_BASED_OUTPATIENT_CLINIC_OR_DEPARTMENT_OTHER): Payer: Medicare Other | Admitting: Licensed Clinical Social Worker

## 2022-03-14 ENCOUNTER — Inpatient Hospital Stay: Payer: Medicare Other

## 2022-03-14 DIAGNOSIS — Z8049 Family history of malignant neoplasm of other genital organs: Secondary | ICD-10-CM

## 2022-03-14 DIAGNOSIS — C20 Malignant neoplasm of rectum: Secondary | ICD-10-CM | POA: Diagnosis not present

## 2022-03-14 DIAGNOSIS — Z803 Family history of malignant neoplasm of breast: Secondary | ICD-10-CM

## 2022-03-14 DIAGNOSIS — Z800 Family history of malignant neoplasm of digestive organs: Secondary | ICD-10-CM | POA: Diagnosis not present

## 2022-03-14 NOTE — Progress Notes (Signed)
REFERRING PROVIDER: ?[name], MD ?[place],  [place] [address] ? ?PRIMARY PROVIDER:  ?Steele Sizer, MD ? ?PRIMARY REASON FOR VISIT:  ?1. Rectal cancer ([place])   ? ? ? ?HISTORY OF PRESENT ILLNESS:   ?April Luna, a 51 y.o. female, was seen for a [place] cancer genetics consultation at the request of Dr. Tasia Catchings due to a personal and family history of cancer.  April Luna presents to clinic today to discuss the possibility of a hereditary predisposition to cancer, genetic testing, and to further clarify her future cancer risks, as well as potential cancer risks for family members.  ? ?In 2023, at the age of 12, April Luna was diagnosed with rectal cancer. She has surgery scheduled in April with Dr. Dema Severin. ? ? ?CANCER HISTORY:  ?Oncology History  ? No history exists.  ? ? ? ?RISK FACTORS:  ?Menarche was at age 22.  ?First live birth at age 62.  ?OCP use for approximately short time.  ?Ovaries intact: yes.  ?Hysterectomy: no.  ?Menopausal status: premenopausal.  ?HRT use: 0 years. ?Up to date with pelvic exams: yes. ? ?Past Medical History:  ?Diagnosis Date  ? Allergy   ? pollen  ? Anxiety   ? Arthritis   ? right hip  ? Bipolar 1 disorder ([place])   ? Chronic kidney disease   ? COPD (chronic obstructive pulmonary disease) ([place])   ? Depression   ? Family history of adverse reaction to anesthesia   ? grand father had a stroke during anesthesia  ? GERD (gastroesophageal reflux disease)   ? History of kidney stones   ? Hyperlipidemia   ? Hypertension   ? Hypothyroidism   ? Pneumonia   ? Sleep apnea 08/11/2021  ? No CPAP  ? Type 2 diabetes mellitus with microalbuminuria, without long-term current use of insulin ([place]) 06/24/2019  ? ? ?Past Surgical History:  ?Procedure Laterality Date  ? BREAST BIOPSY Left 12/14/2021  ? [place] bx, venus marker, path pending  ? CESAREAN SECTION    ? COLONOSCOPY WITH PROPOFOL N/A 02/09/2022  ? Procedure: COLONOSCOPY WITH PROPOFOL;  Surgeon: Lin Landsman, MD;  Location: [facility];  Service: Endoscopy;  Laterality: N/A;  sleep apnea  ? CYSTOSCOPY W/ RETROGRADES Left 11/08/2018  ? Procedure: CYSTOSCOPY WITH RETROGRADE PYELOGRAM;  Surgeon: Billey Co, MD;  Location: [facility];  Service: Urology;  Laterality: Left;  ? CYSTOSCOPY/URETEROSCOPY/HOLMIUM LASER/STENT PLACEMENT Left 11/08/2018  ? Procedure: CYSTOSCOPY/URETEROSCOPY/HOLMIUM LASER/STENT PLACEMENT;  Surgeon: Billey Co, MD;  Location: [facility];  Service: Urology;  Laterality: Left;  ? MOUTH SURGERY    ? wisdom teeth extraction  ? POLYPECTOMY N/A 02/09/2022  ? Procedure: POLYPECTOMY;  Surgeon: Lin Landsman, MD;  Location: [facility];  Service: Endoscopy;  Laterality: N/A;  ? ? ?Social History  ? ?Socioeconomic History  ? Marital status: Married  ?  Spouse name: Montine Circle  ? Number of children: 1  ? Years of education: 76  ? Highest education level: High school graduate  ?Occupational History  ? Occupation: unemployed  ?  Comment: disabled  ?Tobacco Use  ? Smoking status: Every Day  ?  Packs/day: 1.00  ?  Years: 34.00  ?  Pack years: 34.00  ?  Types: Cigarettes  ?  Start date: 05/13/1985  ? Smokeless tobacco: Never  ? Tobacco comments:  ?  1 PPD 02/24/2021 (started around age 2)  ?Vaping Use  ? Vaping Use: Former  ? Start date:  05/25/2018  ? Quit date: 09/24/2018  ?Substance and Sexual Activity  ? Alcohol use: Yes  ?  Alcohol/week: 4.0 standard drinks  ?  Types: 4 Glasses of wine per week  ?  Comment: occasional  ? Drug use: Not Currently  ?  Types: Marijuana  ?  Comment: none since August 2019  ? Sexual activity: Not Currently  ?  Birth control/protection: Post-menopausal  ?Other Topics Concern  ? Not on file  ?Social History Narrative  ? Not on file  ? ?Social Determinants of Health  ? ?Financial Resource Strain: Medium Risk  ? Difficulty of Paying Living Expenses: Somewhat hard  ?Food Insecurity: No Food Insecurity  ? Worried About Charity fundraiser in the Last Year: Never true  ? Ran Out of  Food in the Last Year: Never true  ?Transportation Needs: Unmet Transportation Needs  ? Lack of Transportation (Medical): Yes  ? Lack of Transportation (Non-Medical): Yes  ?Physical Activity: Inactive  ? Days of Exercise per Week: 0 days  ? Minutes of Exercise per Session: 0 min  ?Stress: No Stress Concern Present  ? Feeling of Stress : Not at all  ?Social Connections: Moderately Integrated  ? Frequency of Communication with Friends and Family: More than three times a week  ? Frequency of Social Gatherings with Friends and Family: Twice a week  ? Attends Religious Services: More than 4 times per year  ? Active Member of Clubs or Organizations: No  ? Attends Archivist Meetings: Never  ? Marital Status: Married  ?  ? ?FAMILY HISTORY:  ?We obtained a detailed, 4-generation family history.  Significant diagnoses are listed below: ?Family History  ?Problem Relation Age of Onset  ? Depression Mother   ? Anxiety disorder Mother   ? Diabetes Mother   ? Hypertension Mother   ? Hyperlipidemia Mother   ? Cancer Mother   ? Uterine cancer Mother   ? Cervical cancer Mother   ? Cancer Father   ?     colon cancer  ? Colon cancer Father   ? Depression Brother   ? Anxiety disorder Brother   ? Diabetes Mellitus II Maternal Grandmother   ? Hypercholesterolemia Maternal Grandmother   ? Cancer Paternal Grandmother   ? Diabetes Paternal Grandmother   ? Melanoma Paternal Grandmother   ? Stomach cancer Paternal Grandmother   ? Breast cancer Maternal Great-grandmother   ? ?April Luna has 1 son (46). She has 1 brother (34).  ? ?April Luna's mother had uterine and cervical cancer at 32 and is living at 57. Patient had 10 maternal aunts/uncles. Two aunts had unknown types of cancer and are living in their 37s. No known cousins with cancer. Maternal grandmother had breast cancer. Grandfather died in his 14s-50s.  ? ?April Luna's father had colon cancer in his 54s-60s and is living at 43. Patient has 1 paternal aunt, 1 paternal  uncle, no cancers. No cancers in cousins. Paternal grandmother had stomach cancer and melanoma, she died at 29. Grandfather died at 57. ? ?April Luna is unaware of previous family history of genetic testing for hereditary cancer risks. Patient's maternal ancestors are of Native [place], [place], [place], [place] descent, and paternal ancestors are of [place], [place], [place] (not sure if Ashkenazi) descent.  There is no known consanguinity. ? ? ? ?GENETIC COUNSELING ASSESSMENT: April Luna is a 51 y.o. female with a personal and family history of colorectal cancer which is somewhat suggestive of a hereditary cancer syndrome and predisposition  to cancer. We, therefore, discussed and recommended the following at today's visit.  ? ?DISCUSSION: We discussed that approximately 10% of colorectal cancer is hereditary. Most cases of hereditary colorectal cancer are associated with Lynch syndrome genes, although there are other genes associated with hereditary cancer as well. Cancers and risks are gene specific.  We discussed that testing is beneficial for several reasons including knowing about other cancer risks, identifying potential screening and risk-reduction options that may be appropriate, and to understand if other family members could be at risk for cancer and allow them to undergo genetic testing.  ? ?We reviewed the characteristics, features and inheritance patterns of hereditary cancer syndromes. We also discussed genetic testing, including the appropriate family members to test, the process of testing, insurance coverage and turn-around-time for results. We discussed the implications of a negative, positive and/or variant of uncertain significant result. We recommended April Luna pursue genetic testing for the [facility] CustomNext+RNA gene panel.  ? ?The CustomNext-Cancer+RNAinsight panel offered by [facility] includes sequencing and rearrangement analysis for the following 47 genes:  APC, ATM, AXIN2, BARD1,  BMPR1A, BRCA1, BRCA2, BRIP1, CDH1, CDK4, CDKN2A, CHEK2, DICER1, EPCAM, GREM1, HOXB13, MEN1, MLH1, MSH2, MSH3, MSH6, MUTYH, NBN, NF1, NF2, NTHL1, PALB2, PMS2, POLD1, POLE, PTEN, RAD51C, RAD51D, RECQL, RET, SDHA

## 2022-03-14 NOTE — Progress Notes (Signed)
Name: April Luna   MRN: [id number]    DOB: 04/16/1971   Date:03/15/2022 ? ?     Progress Note ? ?Subjective ? ?Chief Complaint ? ?Follow Up ? ?HPI ? ?COPD: she sees Dr. Duwayne Heck and is now on Luttrell but not taking daily . She has chronic cough and sputum that is yellow, she also has wheezing usually at night and SOB with activity. She also had a sleep study that was positive for OSA but cannot afford CPAP at thist ime. She is still smoking,currently about 1.5 pack daily. She is trying to quit . Advised to follow up with Dr. Duwayne Heck ?  ?HTN: bp is  towards low end of normal and has noticed some dizziness lately, we will decrease dose of Losartan from 50 mg to 25 mg daily and monitor. She dneies chest pain or palpitation  ?  ?Hyperlipidemia: she is now getting medication delivered pre-packaged to her house and has been more compliant, on crestor and vascepa  ? ?GERD: currently no heartburn or indigestion , she is taking PPI, now seeing Dr. Marius Ditch  ?  ?DMII: with dyslipidemia, on diet only, A1C it went from 6.2 % to 7.1 %, she has eating healthier but still drinking sweet tea half a gallon daily.  She has  microalbuminuria and CKI stage III , she has dyslipidemia , she has been taking statins and ARB.  ?  ?Bipolar disorder: she is seeing psychiatrist Dr. Loni Muse at [facility], she came in today with RN - Tomi Bamberger Smith-Fisher , that helps with medication management and also gives her monthly injections.  Last admission was 2016 for a psychotic episode. No history of suicide attempts. ?  ?Hypothyroidism: she is taking levothyroxine and denies hair loss, no change in bowel movement, she has chronic dry skin Last TSH was at goal but feeling more tired than usual and we will recheck labs.  ? ?Chronic right lower back pain: going on for years, she states got better when adjusted, seems to be right on top of sacro- iliac joint, she states occasionally shoots down to her right lower leg. Taking gabapentin and prn tylenol and  symptoms are stable. Unchanged  ? ?Psoriasis: seen by dermatologist in the past, Dr. Koleen Nimrod, she is now getting otc shampoo and is doing well controlling scalp itching  ? ?Rectal cancer: found during screening colonoscopy, diagnosed 01/2022 , going to have robotic surgery April 2022  ? ?Atherosclerosis of Aorta: taking statins and fish oil, reviewed CT abdomen with patient today  ? ?Patient Active Problem List  ? Diagnosis Date Noted  ? Family history of colon cancer 03/14/2022  ? Family history of uterine cancer 03/14/2022  ? Family history of breast cancer 03/14/2022  ? Screening for colon cancer   ? Rectal mass   ? Polyp of ascending colon   ? Sleep apnea 08/11/2021  ? Plantar callus 10/06/2019  ? Acquired hallux limitus of both feet 10/06/2019  ? Type 2 diabetes mellitus with microalbuminuria, without long-term current use of insulin ([place]) 06/24/2019  ? Chronic obstructive pulmonary disease ([place]) 06/24/2019  ? Chronic constipation 06/24/2019  ? ASCUS of cervix with negative high risk HPV 09/05/2018  ? GERD without esophagitis 04/11/2018  ? Hyperlipidemia 04/11/2018  ? Hypertension 04/11/2018  ? Hypothyroidism 04/26/2015  ? Bipolar I disorder, most recent episode (or current) manic ([place]) 04/25/2015  ? Delirium due to another medical condition 04/25/2015  ? Cannabis abuse 04/25/2015  ? ? ?Past Surgical History:  ?Procedure Laterality Date  ?  BREAST BIOPSY Left 12/14/2021  ? us bx, venus marker, path pending  ? CESAREAN SECTION    ? COLONOSCOPY WITH PROPOFOL N/A 02/09/2022  ? Procedure: COLONOSCOPY WITH PROPOFOL;  Surgeon: Vanga, Rohini Reddy, MD;  Location: [facility];  Service: Endoscopy;  Laterality: N/A;  sleep apnea  ? CYSTOSCOPY W/ RETROGRADES Left 11/08/2018  ? Procedure: CYSTOSCOPY WITH RETROGRADE PYELOGRAM;  Surgeon: Sninsky, Brian C, MD;  Location: [facility];  Service: Urology;  Laterality: Left;  ? CYSTOSCOPY/URETEROSCOPY/HOLMIUM LASER/STENT PLACEMENT Left 11/08/2018  ? Procedure:  CYSTOSCOPY/URETEROSCOPY/HOLMIUM LASER/STENT PLACEMENT;  Surgeon: Sninsky, Brian C, MD;  Location: [facility];  Service: Urology;  Laterality: Left;  ? MOUTH SURGERY    ? wisdom teeth extraction  ? POLYPECTOMY N/A 02/09/2022  ? Procedure: POLYPECTOMY;  Surgeon: Vanga, Rohini Reddy, MD;  Location: [facility];  Service: Endoscopy;  Laterality: N/A;  ? ? ?Family History  ?Problem Relation Age of Onset  ? Depression Mother   ? Anxiety disorder Mother   ? Diabetes Mother   ? Hypertension Mother   ? Hyperlipidemia Mother   ? Cancer Mother   ? Uterine cancer Mother 50  ? Cervical cancer Mother 50  ? Colon cancer Father   ? Depression Brother   ? Anxiety disorder Brother   ? Cancer Maternal Aunt   ?     unk types  ? Diabetes Mellitus II Maternal Grandmother   ? Hypercholesterolemia Maternal Grandmother   ? Breast cancer Maternal Grandmother   ? Cancer Paternal Grandmother   ? Diabetes Paternal Grandmother   ? Melanoma Paternal Grandmother   ? Stomach cancer Paternal Grandmother   ? ? ?Social History  ? ?Tobacco Use  ? Smoking status: Every Day  ?  Packs/day: 1.00  ?  Years: 34.00  ?  Pack years: 34.00  ?  Types: Cigarettes  ?  Start date: 05/13/1985  ? Smokeless tobacco: Never  ? Tobacco comments:  ?  1 PPD 02/24/2021 (started around age 16)  ?Substance Use Topics  ? Alcohol use: Yes  ?  Alcohol/week: 4.0 standard drinks  ?  Types: 4 Glasses of wine per week  ?  Comment: occasional  ? ? ? ?Current Outpatient Medications:  ?  albuterol (PROVENTIL) (2.5 MG/3ML) 0.083% nebulizer solution, Take 3 mLs (2.5 mg total) by nebulization every 6 (six) hours as needed for wheezing or shortness of breath., Disp: 150 mL, Rfl: 1 ?  amantadine (SYMMETREL) 100 MG capsule, Take 100 mg by mouth 2 (two) times daily., Disp: , Rfl:  ?  blood glucose meter kit and supplies, Dispense based on patient and insurance preference. Use up to four times daily as directed. (FOR ICD-10 E10.9, E11.9)., Disp: 1 each, Rfl: 0 ?   Budeson-Glycopyrrol-Formoterol (BREZTRI AEROSPHERE) 160-9-4.8 MCG/ACT AERO, Inhale 2 puffs into the lungs in the morning and at bedtime., Disp: 5.9 g, Rfl: 11 ?  buPROPion (WELLBUTRIN XL) 150 MG 24 hr tablet, Take 150 mg by mouth daily., Disp: , Rfl:  ?  Cholecalciferol (VITAMIN D-3) 125 MCG (5000 UT) TABS, Take 5,000 Units by mouth daily., Disp: 30 tablet, Rfl: 1 ?  gabapentin (NEURONTIN) 300 MG capsule, Take 300 mg by mouth 2 (two) times daily., Disp: , Rfl:  ?  hydrOXYzine (VISTARIL) 50 MG capsule, Take 50 mg by mouth 3 (three) times daily as needed for anxiety., Disp: , Rfl:  ?  icosapent Ethyl (VASCEPA) 1 g capsule, TAKE 2 CAPSULES BY MOUTH 2 TIMES DAILY, Disp: 120 capsule, Rfl: 0 ?  INVEGA   SUSTENNA 234 MG/1.5ML SUSY injection, Inject 1.5 mLs into the muscle every 30 (thirty) days. Inject as directed once a month, Disp: , Rfl:  ?  levothyroxine (SYNTHROID) 75 MCG tablet, TAKE 1 TABLET BY MOUTH DAILY BEFORE BREAKFAST, Disp: 30 tablet, Rfl: 0 ?  linaclotide (LINZESS) 145 MCG CAPS capsule, Take 1 capsule (145 mcg total) by mouth daily before breakfast., Disp: 30 capsule, Rfl: 2 ?  loratadine (CLARITIN) 10 MG tablet, Take 10 mg by mouth daily., Disp: , Rfl:  ?  losartan (COZAAR) 50 MG tablet, Take 1 tablet (50 mg total) by mouth daily., Disp: 30 tablet, Rfl: 3 ?  nicotine (NICOTROL) 10 MG inhaler, Inhale 1 Cartridge (1 continuous puffing total) into the lungs as needed for smoking cessation., Disp: 36 each, Rfl: 3 ?  pantoprazole (PROTONIX) 40 MG tablet, Take 1 tablet (40 mg total) by mouth daily., Disp: 30 tablet, Rfl: 3 ?  rosuvastatin (CRESTOR) 40 MG tablet, TAKE ONE TABLET BY MOUTH DAILY, Disp: 90 tablet, Rfl: 0 ?  sertraline (ZOLOFT) 100 MG tablet, Take 200 mg by mouth daily., Disp: , Rfl:  ? ?Allergies  ?Allergen Reactions  ? Metformin And Related Nausea And Vomiting  ? Perphenazine Other (See Comments)  ?  Tremors, muscle weakness, tongue swelling  ? Sulfa Antibiotics Other (See Comments)  ?  GI distress ?   ? Abilify [Aripiprazole] Rash  ? Penicillins Rash  ?  She has taken amoxicillin without problems  ? ? ?I personally reviewed active problem list, medication list, allergies, family history, social history, health maintenance with the patien

## 2022-03-15 ENCOUNTER — Encounter: Payer: Self-pay | Admitting: Family Medicine

## 2022-03-15 ENCOUNTER — Other Ambulatory Visit: Payer: Self-pay

## 2022-03-15 ENCOUNTER — Ambulatory Visit (INDEPENDENT_AMBULATORY_CARE_PROVIDER_SITE_OTHER): Payer: Medicare Other | Admitting: Family Medicine

## 2022-03-15 VITALS — BP 112/72 | HR 90 | Resp 16 | Ht 59.0 in | Wt 149.0 lb

## 2022-03-15 DIAGNOSIS — F17200 Nicotine dependence, unspecified, uncomplicated: Secondary | ICD-10-CM

## 2022-03-15 DIAGNOSIS — C20 Malignant neoplasm of rectum: Secondary | ICD-10-CM

## 2022-03-15 DIAGNOSIS — F3110 Bipolar disorder, current episode manic without psychotic features, unspecified: Secondary | ICD-10-CM

## 2022-03-15 DIAGNOSIS — I10 Essential (primary) hypertension: Secondary | ICD-10-CM

## 2022-03-15 DIAGNOSIS — K219 Gastro-esophageal reflux disease without esophagitis: Secondary | ICD-10-CM

## 2022-03-15 DIAGNOSIS — E1129 Type 2 diabetes mellitus with other diabetic kidney complication: Secondary | ICD-10-CM

## 2022-03-15 DIAGNOSIS — E032 Hypothyroidism due to medicaments and other exogenous substances: Secondary | ICD-10-CM

## 2022-03-15 DIAGNOSIS — E1169 Type 2 diabetes mellitus with other specified complication: Secondary | ICD-10-CM

## 2022-03-15 DIAGNOSIS — I700 Atherosclerosis of aorta: Secondary | ICD-10-CM

## 2022-03-15 DIAGNOSIS — J411 Mucopurulent chronic bronchitis: Secondary | ICD-10-CM

## 2022-03-15 DIAGNOSIS — R809 Proteinuria, unspecified: Secondary | ICD-10-CM

## 2022-03-15 DIAGNOSIS — E785 Hyperlipidemia, unspecified: Secondary | ICD-10-CM

## 2022-03-15 DIAGNOSIS — N1831 Chronic kidney disease, stage 3a: Secondary | ICD-10-CM

## 2022-03-15 DIAGNOSIS — G4733 Obstructive sleep apnea (adult) (pediatric): Secondary | ICD-10-CM

## 2022-03-15 MED ORDER — ROSUVASTATIN CALCIUM 40 MG PO TABS: 40.0000 mg | ORAL_TABLET | Freq: Every day | ORAL | 3 refills | Status: DC

## 2022-03-15 MED ORDER — ICOSAPENT ETHYL 1 G PO CAPS: 2.0000 g | ORAL_CAPSULE | Freq: Two times a day (BID) | ORAL | 3 refills | Status: DC

## 2022-03-15 MED ORDER — LEVOTHYROXINE SODIUM 75 MCG PO TABS: 75.0000 ug | ORAL_TABLET | Freq: Every day | ORAL | 0 refills | Status: DC

## 2022-03-15 MED ORDER — LOSARTAN POTASSIUM 25 MG PO TABS: 25.0000 mg | ORAL_TABLET | Freq: Every day | ORAL | 3 refills | Status: DC

## 2022-03-15 MED ORDER — BLOOD GLUCOSE METER KIT: PACK | 0 refills | Status: DC

## 2022-03-15 NOTE — Progress Notes (Signed)
The proposed treatment discussed in conference is for discussion purpose only and is not a binding recommendation.  The patients have not been physically examined, or presented with their treatment options.  Therefore, final treatment plans cannot be decided.  

## 2022-03-16 ENCOUNTER — Other Ambulatory Visit: Payer: Self-pay | Admitting: Family Medicine

## 2022-03-16 DIAGNOSIS — E1129 Type 2 diabetes mellitus with other diabetic kidney complication: Secondary | ICD-10-CM

## 2022-03-16 LAB — HEMOGLOBIN A1C
Hgb A1c MFr Bld: 7.3 % of total Hgb — ABNORMAL HIGH (ref <5.7)
Mean Plasma Glucose: 163 mg/dL
eAG (mmol/L): 9 mmol/L

## 2022-03-16 LAB — LIPID PANEL
Cholesterol: 139 mg/dL (ref <200)
HDL: 43 mg/dL — ABNORMAL LOW (ref >=50)
LDL Cholesterol (Calc): 71 mg/dL (calc)
Non-HDL Cholesterol (Calc): 96 mg/dL (calc) (ref <130)
Total CHOL/HDL Ratio: 3.2 (calc) (ref <5.0)
Triglycerides: 167 mg/dL — ABNORMAL HIGH (ref <150)

## 2022-03-16 MED ORDER — DAPAGLIFLOZIN PROPANEDIOL 10 MG PO TABS: 10.0000 mg | ORAL_TABLET | Freq: Every day | ORAL | 2 refills | Status: DC

## 2022-03-21 ENCOUNTER — Telehealth: Payer: Self-pay

## 2022-03-21 NOTE — Telephone Encounter (Signed)
Call placed to [name]. She would like to see Dr. Dema Luna again and is inquiring about "blood work" she needs and is requesting transportation.  She was previously provided the phone number to call and make an appointment for when her [name] representative can go with her. She has not done this. Dr. Dema Luna will be back in [place] on 4/11 and I can help with transportation as needed, if they have appointments available. She is to call April Luna, and have her call me so we can see if this is an option for her. ?

## 2022-03-21 NOTE — Telephone Encounter (Signed)
Received voicemail from [employer], Tomi Bamberger, while she was with Ms. Mirelez with possible need for transportation to an appointment. Call returned and Tomi Bamberger is no longer with Ms. Noffsinger. Call placed to Ms. Glasper with no answer and no voicemail available. ?

## 2022-03-23 ENCOUNTER — Telehealth: Payer: Self-pay | Admitting: Licensed Clinical Social Worker

## 2022-03-24 DIAGNOSIS — E1169 Type 2 diabetes mellitus with other specified complication: Secondary | ICD-10-CM

## 2022-03-24 DIAGNOSIS — F17210 Nicotine dependence, cigarettes, uncomplicated: Secondary | ICD-10-CM

## 2022-03-24 DIAGNOSIS — F319 Bipolar disorder, unspecified: Secondary | ICD-10-CM

## 2022-03-24 DIAGNOSIS — I10 Essential (primary) hypertension: Secondary | ICD-10-CM

## 2022-03-27 ENCOUNTER — Encounter: Payer: Self-pay | Admitting: Licensed Clinical Social Worker

## 2022-03-27 ENCOUNTER — Ambulatory Visit (INDEPENDENT_AMBULATORY_CARE_PROVIDER_SITE_OTHER): Payer: Medicare Other | Admitting: *Deleted

## 2022-03-27 ENCOUNTER — Ambulatory Visit: Payer: Self-pay | Admitting: Licensed Clinical Social Worker

## 2022-03-27 DIAGNOSIS — F3110 Bipolar disorder, current episode manic without psychotic features, unspecified: Secondary | ICD-10-CM

## 2022-03-27 DIAGNOSIS — Z1379 Encounter for other screening for genetic and chromosomal anomalies: Secondary | ICD-10-CM | POA: Insufficient documentation

## 2022-03-27 DIAGNOSIS — C20 Malignant neoplasm of rectum: Secondary | ICD-10-CM

## 2022-03-27 DIAGNOSIS — E1129 Type 2 diabetes mellitus with other diabetic kidney complication: Secondary | ICD-10-CM

## 2022-03-27 NOTE — Patient Instructions (Signed)
Visit Information ? ?Thank you for taking time to visit with me today. Please don't hesitate to contact me if I can be of assistance to you before our next scheduled telephone appointment. ? ?Following are the goals we discussed today:  ? ?- arrange a ride through an agency 1 week before appointment ?- call to cancel if needed ?- keep a calendar with appointment dates  ?-patient to utilize the ACT team and her transportation benefit on her [facility] plan to arrange transportation to upcoming medical appointments  ?Our next appointment is by telephone on 04/10/22 at 1pm ? ?Please call the care guide team at [phone] if you need to cancel or reschedule your appointment.  ? ?If you are experiencing a Mental Health or [place] or need someone to talk to, please call the Suicide and Crisis Lifeline: 988  ? ?Patient verbalizes understanding of instructions and care plan provided today and agrees to view in [place]. Active MyChart status confirmed with patient.   ? ?Telephone follow up appointment with care management team member scheduled for: 04/10/22 ? ?Raymie Trani, LCSW ?Clinical Social Worker  ?[facility] Care Management ?[phone] ? ?

## 2022-03-27 NOTE — Telephone Encounter (Signed)
Revealed negative genetic testing.   This normal result is reassuring and indicates that it is unlikely April Luna's cancer is due to a hereditary cause.  It is unlikely that there is an increased risk of another cancer due to a mutation in one of these genes.  However, genetic testing is not perfect, and cannot definitively rule out a hereditary cause.  It will be important for her to keep in contact with genetics to learn if any additional testing may be needed in the future.    ? ?

## 2022-03-27 NOTE — Progress Notes (Signed)
HPI:  April Luna was previously seen in the [facility] due to a personal history of cancer and concerns regarding a hereditary predisposition to cancer. Please refer to our prior cancer genetics clinic note for more information regarding our discussion, assessment and recommendations, at the time. April Luna recent genetic test results were disclosed to her, as were recommendations warranted by these results. These results and recommendations are discussed in more detail below. ? ?CANCER HISTORY:  ?Oncology History  ? No history exists.  ? ? ?FAMILY HISTORY:  ?We obtained a detailed, 4-generation family history.  Significant diagnoses are listed below: ?Family History  ?Problem Relation Age of Onset  ? Depression Mother   ? Anxiety disorder Mother   ? Diabetes Mother   ? Hypertension Mother   ? Hyperlipidemia Mother   ? Cancer Mother   ? Uterine cancer Mother 43  ? Cervical cancer Mother 6  ? Colon cancer Father   ? Depression Brother   ? Anxiety disorder Brother   ? Cancer Maternal Aunt   ?     unk types  ? Diabetes Mellitus II Maternal Grandmother   ? Hypercholesterolemia Maternal Grandmother   ? Breast cancer Maternal Grandmother   ? Cancer Paternal Grandmother   ? Diabetes Paternal Grandmother   ? Melanoma Paternal Grandmother   ? Stomach cancer Paternal Grandmother   ? ?April Luna has 1 son (65). She has 1 brother (19).  ?  ?April Luna's mother had uterine and cervical cancer at 60 and is living at 80. Patient had 10 maternal aunts/uncles. Two aunts had unknown types of cancer and are living in their 68s. No known cousins with cancer. Maternal grandmother had breast cancer. Grandfather died in his 51s-50s.  ?  ?April Luna's father had colon cancer in his 76s-60s and is living at 87. Patient has 1 paternal aunt, 1 paternal uncle, no cancers. No cancers in cousins. Paternal grandmother had stomach cancer and melanoma, she died at 61. Grandfather died at 46. ?  ?April Luna is  unaware of previous family history of genetic testing for hereditary cancer risks. Patient's maternal ancestors are of Native [place], [place], [place], [place] descent, and paternal ancestors are of [place], [place], [place] (not sure if Ashkenazi) descent.  There is no known consanguinity. ?  ?  ? ? ?GENETIC TEST RESULTS: Genetic testing reported out on 03/22/2022 through the [facility] CustomNext+RNA cancer panel found no pathogenic mutations.  ? ?The CustomNext-Cancer+RNAinsight panel offered by [facility] includes sequencing and rearrangement analysis for the following 47 genes:  APC, ATM, AXIN2, BARD1, BMPR1A, BRCA1, BRCA2, BRIP1, CDH1, CDK4, CDKN2A, CHEK2, DICER1, EPCAM, GREM1, HOXB13, MEN1, MLH1, MSH2, MSH3, MSH6, MUTYH, NBN, NF1, NF2, NTHL1, PALB2, PMS2, POLD1, POLE, PTEN, RAD51C, RAD51D, RECQL, RET, SDHA, SDHAF2, SDHB, SDHC, SDHD, SMAD4, SMARCA4, STK11, TP53, TSC1, TSC2, and VHL.  RNA data is routinely analyzed for use in variant interpretation for all genes.  ? ?The test report has been scanned into EPIC and is located under the Molecular Pathology section of the Results Review tab.  A portion of the result report is included below for reference.  ? ? ? ?We discussed that because current genetic testing is not perfect, it is possible there may be a gene mutation in one of these genes that current testing cannot detect, but that chance is small.  There could be another gene that has not yet been discovered, or that we have not yet tested, that is responsible for the cancer diagnoses in  the family. It is also possible there is a hereditary cause for the cancer in the family that April Luna did not inherit and therefore was not identified in her testing.  Therefore, it is important to remain in touch with cancer genetics in the future so that we can continue to offer April Luna the most up to date genetic testing.  ? ?ADDITIONAL GENETIC TESTING: We discussed with April Luna that her genetic testing was fairly  extensive.  If there are genes identified to increase cancer risk that can be analyzed in the future, we would be happy to discuss and coordinate this testing at that time.   ? ?CANCER SCREENING RECOMMENDATIONS: April Luna test result is considered negative (normal).  This means that we have not identified a hereditary cause for her  personal and family history of cancer at this time. Most cancers happen by chance and this negative test suggests that her cancer may fall into this category.   ? ?While reassuring, this does not definitively rule out a hereditary predisposition to cancer. It is still possible that there could be genetic mutations that are undetectable by current technology. There could be genetic mutations in genes that have not been tested or identified to increase cancer risk.  Therefore, it is recommended she continue to follow the cancer management and screening guidelines provided by her oncology and primary healthcare provider.  ? ?An individual's cancer risk and medical management are not determined by genetic test results alone. Overall cancer risk assessment incorporates additional factors, including personal medical history, family history, and any available genetic information that may result in a personalized plan for cancer prevention and surveillance. ? ?RECOMMENDATIONS FOR FAMILY MEMBERS:  Relatives in this family might be at some increased risk of developing cancer, over the general population risk, simply due to the family history of cancer.  We recommended female relatives in this family have a yearly mammogram beginning at age 73, or 46 years younger than the earliest onset of cancer, an annual clinical breast exam, and perform monthly breast self-exams. Female relatives in this family should also have a gynecological exam as recommended by their primary provider.  All family members should be referred for colonoscopy starting at age 22.  ? ? It is also possible there is a  hereditary cause for the cancer in April Luna's family that she did not inherit and therefore was not identified in her.  Based on April Luna's family history, we recommended maternal relatives have genetic counseling and testing. April Luna will let us know if we can be of any assistance in coordinating genetic counseling and/or testing for these family members. ? ?FOLLOW-UP: Lastly, we discussed with April Luna that cancer genetics is a rapidly advancing field and it is possible that new genetic tests will be appropriate for her and/or her family members in the future. We encouraged her to remain in contact with cancer genetics on an annual basis so we can update her personal and family histories and let her know of advances in cancer genetics that may benefit this family.  ? ?Our contact number was provided. April Luna questions were answered to her satisfaction, and she knows she is welcome to call us at anytime with additional questions or concerns.  ? ?Faith Rogue, MS, LCGC ?Genetic Counselor ?[email or web address] ?Phone: [phone] ? ?

## 2022-03-27 NOTE — Chronic Care Management (AMB) (Signed)
?Chronic Care Management  ? ? Clinical Social Work Note ? ?03/27/2022 ?Name: April Luna MRN: [id number] DOB: [birth year] ? ?April Luna is a 51 y.o. year old female who is a primary care patient of Steele Sizer, MD. The CCM team was consulted to assist the patient with chronic disease management and/or care coordination needs related to: Transportation Needs  and [employer] .  ? ?Engaged with patient by telephone for follow up visit in response to provider referral for social work chronic care management and care coordination services.  ? ?Consent to Services:  ?The patient was given information about Chronic Care Management services, agreed to services, and gave verbal consent prior to initiation of services.  Please see initial visit note for detailed documentation.  ? ?Patient agreed to services and consent obtained.  ? ?Assessment: Review of patient past medical history, allergies, medications, and health status, including review of relevant consultants reports was performed today as part of a comprehensive evaluation and provision of chronic care management and care coordination services.    ? ?SDOH (Social Determinants of Health) assessments and interventions performed:   ? ?Advanced Directives Status: Not addressed in this encounter. ? ?CCM Care Plan ? ?Allergies  ?Allergen Reactions  ? Metformin And Related Nausea And Vomiting  ? Perphenazine Other (See Comments)  ?  Tremors, muscle weakness, tongue swelling  ? Sulfa Antibiotics Other (See Comments)  ?  GI distress ?  ? Abilify [Aripiprazole] Rash  ? Penicillins Rash  ?  She has taken amoxicillin without problems  ? ? ?Outpatient Encounter Medications as of 03/27/2022  ?Medication Sig  ? amantadine (SYMMETREL) 100 MG capsule Take 100 mg by mouth 2 (two) times daily.  ? blood glucose meter kit and supplies Dispense based on patient and insurance preference. Use up to four times daily as directed. (FOR ICD-10 E10.9, E11.9).  ?  Budeson-Glycopyrrol-Formoterol (BREZTRI AEROSPHERE) 160-9-4.8 MCG/ACT AERO Inhale 2 puffs into the lungs in the morning and at bedtime.  ? buPROPion (WELLBUTRIN XL) 150 MG 24 hr tablet Take 150 mg by mouth daily.  ? Cholecalciferol (VITAMIN D-3) 125 MCG (5000 UT) TABS Take 5,000 Units by mouth daily.  ? dapagliflozin propanediol (FARXIGA) 10 MG TABS tablet Take 1 tablet (10 mg total) by mouth daily before breakfast.  ? gabapentin (NEURONTIN) 300 MG capsule Take 300 mg by mouth 2 (two) times daily.  ? hydrOXYzine (VISTARIL) 50 MG capsule Take 50 mg by mouth 3 (three) times daily as needed for anxiety.  ? icosapent Ethyl (VASCEPA) 1 g capsule Take 2 capsules (2 g total) by mouth 2 (two) times daily.  ? INVEGA SUSTENNA 234 MG/1.5ML SUSY injection Inject 1.5 mLs into the muscle every 30 (thirty) days. Inject as directed once a month  ? levothyroxine (SYNTHROID) 75 MCG tablet Take 1 tablet (75 mcg total) by mouth daily before breakfast.  ? linaclotide (LINZESS) 145 MCG CAPS capsule Take 1 capsule (145 mcg total) by mouth daily before breakfast.  ? loratadine (CLARITIN) 10 MG tablet Take 10 mg by mouth daily.  ? losartan (COZAAR) 25 MG tablet Take 1 tablet (25 mg total) by mouth daily.  ? nicotine (NICOTROL) 10 MG inhaler Inhale 1 Cartridge (1 continuous puffing total) into the lungs as needed for smoking cessation.  ? pantoprazole (PROTONIX) 40 MG tablet Take 1 tablet (40 mg total) by mouth daily.  ? rosuvastatin (CRESTOR) 40 MG tablet Take 1 tablet (40 mg total) by mouth daily.  ? sertraline (ZOLOFT) 100 MG tablet Take  200 mg by mouth daily.  ? ?No facility-administered encounter medications on file as of 03/27/2022.  ? ? ?Patient Active Problem List  ? Diagnosis Date Noted  ? Genetic testing 03/27/2022  ? Family history of colon cancer 03/14/2022  ? Family history of uterine cancer 03/14/2022  ? Family history of breast cancer 03/14/2022  ? Screening for colon cancer   ? Rectal mass   ? Polyp of ascending colon   ? Sleep  apnea 08/11/2021  ? Plantar callus 10/06/2019  ? Acquired hallux limitus of both feet 10/06/2019  ? Type 2 diabetes mellitus with microalbuminuria, without long-term current use of insulin ([place]) 06/24/2019  ? Chronic obstructive pulmonary disease ([place]) 06/24/2019  ? Chronic constipation 06/24/2019  ? ASCUS of cervix with negative high risk HPV 09/05/2018  ? GERD without esophagitis 04/11/2018  ? Hyperlipidemia 04/11/2018  ? Hypertension 04/11/2018  ? Hypothyroidism 04/26/2015  ? Bipolar I disorder, most recent episode (or current) manic ([place]) 04/25/2015  ? Delirium due to another medical condition 04/25/2015  ? Cannabis abuse 04/25/2015  ? ? ?Conditions to be addressed/monitored: HTN, DMII, and Bipolar Disorder; Mental Health Concerns  ? ?Care Plan : General Social Work (Adult)  ?Updates made by Vern Claude, LCSW since 03/27/2022 12:00 AM  ?  ? ?Problem: [facility] AMB "PATIENT-SPECIFIC PROBLEM"   ?Note:   ?CARE PLAN ENTRY ?(see longitudinal plan of care for additional care plan information) ? ?Current Barriers:  ?Patient with HTN, DM, and Bipolar Disorder in need of assistance with connection to community resources-patient states that she get's overwhelmed with the coordination of appointments and has a hard time following through ?Knowledge deficits and need for support, education and care coordination related to community resources support  ?Patient having difficulty keeping specialist appointments ? ?Clinical Goal(s)  ?Over the next  90  days patient will be able to schedule and keep medical appointments as demonstrated by increased ability to schedule both the upcoming appointments and arranging transportation to get there. ?Over the next 90 days, patient will continue to work with the ACT Team (community agency) to maintain adherence to medical appointments and medication management ? ?Interventions provided by LCSW:  ?Assessed patient's care coordination needs related to specialist needs and discussed ongoing  care management follow up  ?Patient requesting a review of medical appointments to ensure transportation ?Confirmed that ACT Team social worker agreeabe to assisting patient with transportation to medical appointments, however is having surgery on 04/21/22 at [facility] and is not sure how she will get there ?Patient reminded of [facility] transportation benefit for routine appointments and possible transportation through [facility] for her procedure on 04/21/22 ?Emotional support provided, patient confirmed strong family and ACT Team support in regards to her diagnosis and transportation needs ?Emotional support and encouragement continues to be provided ?Patient able to remain hopeful and verbalizes appropriate use of self care strategies ?Positive reinforcement provided for motivation for close follow in regards to specialists needs. ? ? ?Patient Self Care Activities & Deficits:  ?Patient is unable to independently navigate community resource options without care coordination support  ?Acknowledges deficits and is motivated to resolve concern  ?Patient is able to contact her Act Team  as discussed today for additional assistance with coordinating specialist appointments ?Unable to perform IADLs independently ?Performs ADL's independently ?Motivation for treatment ? ?Please see past updates related to this goal by clicking on the "Past Updates" button in the selected goal  ? ? ?  ?  ? ?Follow Up Plan: SW will  follow up with patient by phone over the next 14 business days ?     ? , LCSW ?Clinical Social Worker  ?[facility] ?[phone] ? ? ? ?

## 2022-04-03 ENCOUNTER — Ambulatory Visit: Payer: Medicare Other | Admitting: Pulmonary Disease

## 2022-04-10 ENCOUNTER — Ambulatory Visit: Payer: Medicare Other | Admitting: *Deleted

## 2022-04-10 DIAGNOSIS — E1129 Type 2 diabetes mellitus with other diabetic kidney complication: Secondary | ICD-10-CM

## 2022-04-10 DIAGNOSIS — C20 Malignant neoplasm of rectum: Secondary | ICD-10-CM

## 2022-04-10 DIAGNOSIS — F3110 Bipolar disorder, current episode manic without psychotic features, unspecified: Secondary | ICD-10-CM

## 2022-04-10 NOTE — Patient Instructions (Signed)
Visit Information ? ?Thank you for taking time to visit with me today. Please don't hesitate to contact me if I can be of assistance to you before our next scheduled telephone appointment. ? ?Following are the goals we discussed today:  ? ?- arrange a ride through an agency 1 week before appointment ?- call to cancel if needed ?- keep a calendar with appointment dates -surgery scheduled for 04/21/22 at 5:30AM ?-patient to utilize the ACT team and her transportation benefit on her [facility] plan to arrange transportation to upcoming medical appointments  ?Why is this important?   ? ?Our next appointment is by telephone on 04/11/22 at TBD ? ?Please call the care guide team at [phone] if you need to cancel or reschedule your appointment.  ? ?If you are experiencing a Mental Health or [place] or need someone to talk to, please call the Suicide and Crisis Lifeline: 988  ? ?Patient verbalizes understanding of instructions and care plan provided today and agrees to view in [place]. Active MyChart status confirmed with patient.   ? ?Telephone follow up appointment with care management team member scheduled for: 04/11/22 ? ?Nathanial Arrighi, LCSW ?Clinical Social Worker  ?[facility] ?[phone] ? ?

## 2022-04-10 NOTE — Chronic Care Management (AMB) (Signed)
?Chronic Care Management  ? ? Clinical Social Work Note ? ?04/10/2022 ?Name: LAJOY VANAMBURG MRN: [id number] DOB: 04-Jan-1971 ? ?VERONNICA HENNINGS is a 51 y.o. year old female who is a primary care patient of Steele Sizer, MD. The CCM team was consulted to assist the patient with chronic disease management and/or care coordination needs related to: [employer] .  ? ?Engaged with patient by telephone for follow up visit in response to provider referral for social work chronic care management and care coordination services.  ? ?Consent to Services:  ?The patient was given information about Chronic Care Management services, agreed to services, and gave verbal consent prior to initiation of services.  Please see initial visit note for detailed documentation.  ? ?Patient agreed to services and consent obtained.  ? ?Assessment: Review of patient past medical history, allergies, medications, and health status, including review of relevant consultants reports was performed today as part of a comprehensive evaluation and provision of chronic care management and care coordination services.    ? ?SDOH (Social Determinants of Health) assessments and interventions performed:   ? ?Advanced Directives Status: Not addressed in this encounter. ? ?CCM Care Plan ? ?Allergies  ?Allergen Reactions  ? Metformin And Related Nausea And Vomiting  ? Perphenazine Other (See Comments)  ?  Tremors, muscle weakness, tongue swelling  ? Sulfa Antibiotics Other (See Comments)  ?  GI distress ?  ? Abilify [Aripiprazole] Rash  ? Penicillins Rash  ?  She has taken amoxicillin without problems  ? ? ?Outpatient Encounter Medications as of 04/10/2022  ?Medication Sig  ? amantadine (SYMMETREL) 100 MG capsule Take 100 mg by mouth 2 (two) times daily.  ? blood glucose meter kit and supplies Dispense based on patient and insurance preference. Use up to four times daily as directed. (FOR ICD-10 E10.9, E11.9).  ? Budeson-Glycopyrrol-Formoterol  (BREZTRI AEROSPHERE) 160-9-4.8 MCG/ACT AERO Inhale 2 puffs into the lungs in the morning and at bedtime.  ? buPROPion (WELLBUTRIN XL) 150 MG 24 hr tablet Take 150 mg by mouth daily.  ? Cholecalciferol (VITAMIN D-3) 125 MCG (5000 UT) TABS Take 5,000 Units by mouth daily.  ? dapagliflozin propanediol (FARXIGA) 10 MG TABS tablet Take 1 tablet (10 mg total) by mouth daily before breakfast.  ? gabapentin (NEURONTIN) 300 MG capsule Take 300 mg by mouth 2 (two) times daily.  ? hydrOXYzine (VISTARIL) 50 MG capsule Take 50 mg by mouth 3 (three) times daily as needed for anxiety.  ? icosapent Ethyl (VASCEPA) 1 g capsule Take 2 capsules (2 g total) by mouth 2 (two) times daily.  ? INVEGA SUSTENNA 234 MG/1.5ML SUSY injection Inject 1.5 mLs into the muscle every 30 (thirty) days. Inject as directed once a month  ? levothyroxine (SYNTHROID) 75 MCG tablet Take 1 tablet (75 mcg total) by mouth daily before breakfast.  ? linaclotide (LINZESS) 145 MCG CAPS capsule Take 1 capsule (145 mcg total) by mouth daily before breakfast.  ? loratadine (CLARITIN) 10 MG tablet Take 10 mg by mouth daily.  ? losartan (COZAAR) 25 MG tablet Take 1 tablet (25 mg total) by mouth daily.  ? nicotine (NICOTROL) 10 MG inhaler Inhale 1 Cartridge (1 continuous puffing total) into the lungs as needed for smoking cessation.  ? pantoprazole (PROTONIX) 40 MG tablet Take 1 tablet (40 mg total) by mouth daily.  ? rosuvastatin (CRESTOR) 40 MG tablet Take 1 tablet (40 mg total) by mouth daily.  ? sertraline (ZOLOFT) 100 MG tablet Take 200 mg by mouth  daily.  ? ?No facility-administered encounter medications on file as of 04/10/2022.  ? ? ?Patient Active Problem List  ? Diagnosis Date Noted  ? Genetic testing 03/27/2022  ? Family history of colon cancer 03/14/2022  ? Family history of uterine cancer 03/14/2022  ? Family history of breast cancer 03/14/2022  ? Screening for colon cancer   ? Rectal mass   ? Polyp of ascending colon   ? Sleep apnea 08/11/2021  ? Plantar  callus 10/06/2019  ? Acquired hallux limitus of both feet 10/06/2019  ? Type 2 diabetes mellitus with microalbuminuria, without long-term current use of insulin ([place]) 06/24/2019  ? Chronic obstructive pulmonary disease ([place]) 06/24/2019  ? Chronic constipation 06/24/2019  ? ASCUS of cervix with negative high risk HPV 09/05/2018  ? GERD without esophagitis 04/11/2018  ? Hyperlipidemia 04/11/2018  ? Hypertension 04/11/2018  ? Hypothyroidism 04/26/2015  ? Bipolar I disorder, most recent episode (or current) manic ([place]) 04/25/2015  ? Delirium due to another medical condition 04/25/2015  ? Cannabis abuse 04/25/2015  ? ? ?Conditions to be addressed/monitored:  ? HTN, DMII, and Bipolar Disorder; Mental Health Concerns , transportation needs ?Care Plan : General Social Work (Adult)  ?Updates made by Vern Claude, LCSW since 04/10/2022 12:00 AM  ?  ? ?Problem: [facility] AMB "PATIENT-SPECIFIC PROBLEM"   ?Note:   ?CARE PLAN ENTRY ?(see longitudinal plan of care for additional care plan information) ? ?Current Barriers:  ?Patient with HTN, DM, and Bipolar Disorder in need of assistance with connection to community resources-patient states that she get's overwhelmed with the coordination of appointments and has a hard time following through ?Knowledge deficits and need for support, education and care coordination related to community resources support  ?Patient having difficulty keeping specialist appointments ? ?Clinical Goal(s)  ?Over the next  90  days patient will be able to schedule and keep medical appointments as demonstrated by increased ability to schedule both the upcoming appointments and arranging transportation to get there. ?Over the next 90 days, patient will continue to work with the ACT Team (community agency) to maintain adherence to medical appointments and medication management ? ?Interventions provided by LCSW:  ?Assessed patient's care coordination needs related to specialist needs and discussed ongoing care  management follow up  ?Patient requesting a review of medical appointments to ensure transportation ?Confirmed that ACT Team social worker agreeabe to assisting patient with transportation to medical appointments, however is having surgery on 04/21/22 at [facility] and is not sure how she will get there ?Patient reminded of [facility] transportation benefit for routine appointments and possible transportation through [facility] for her procedure on 04/21/22-[place] contacted-patient needs to present at the hospital at 5:30am -patient agreeable to contacting [facility] to arrange transportation-collaboration call to [place] to discuss transportation options ?Emotional support provided, patient confirmed strong family and ACT Team support in regards to her diagnosis and transportation needs ?Emotional support and encouragement continues to be provided ?Patient able to remain hopeful and verbalizes appropriate use of self care strategies ?Positive reinforcement provided for motivation for close follow in regards to specialists needs. ? ? ?Patient Self Care Activities & Deficits:  ?Patient is unable to independently navigate community resource options without care coordination support  ?Acknowledges deficits and is motivated to resolve concern  ?Patient is able to contact her Act Team  as discussed today for additional assistance with coordinating specialist appointments ?Unable to perform IADLs independently ?Performs ADL's independently ?Motivation for treatment ? ?Please see past updates related to  this goal by clicking on the "Past Updates" button in the selected goal  ? ? ?  ?  ? ?Follow Up Plan: SW will follow up with patient by phone over the next 14 business days ?     ? ?Marlenne Ridge, LCSW ?Clinical Social Worker  ?[facility] ?[phone] ? ? ? ?

## 2022-04-11 ENCOUNTER — Ambulatory Visit: Payer: Self-pay | Admitting: *Deleted

## 2022-04-11 DIAGNOSIS — E1129 Type 2 diabetes mellitus with other diabetic kidney complication: Secondary | ICD-10-CM

## 2022-04-11 DIAGNOSIS — R809 Proteinuria, unspecified: Secondary | ICD-10-CM

## 2022-04-11 DIAGNOSIS — F3110 Bipolar disorder, current episode manic without psychotic features, unspecified: Secondary | ICD-10-CM

## 2022-04-11 DIAGNOSIS — C20 Malignant neoplasm of rectum: Secondary | ICD-10-CM

## 2022-04-11 NOTE — Patient Instructions (Signed)
Visit Information ? ?Thank you for taking time to visit with me today. Please don't hesitate to contact me if I can be of assistance to you before our next scheduled telephone appointment. ? ?Following are the goals we discussed today:  ? ?- arrange a ride through an agency 1 week before appointment ?- call to cancel if needed ?- keep a calendar with appointment dates -surgery scheduled for 04/21/22 at 5:30AM ?-patient to utilize the ACT team and her transportation benefit on her [place] to arrange transportation to upcoming medical appointments -transportation now arranged through [facility] for 04/21/22 ? ? ?Our next appointment is by telephone on 05/08/22 at 2pm ? ?Please call the care guide team at [phone] if you need to cancel or reschedule your appointment.  ? ?If you are experiencing a Mental Health or [place] or need someone to talk to, please call the Suicide and Crisis Lifeline: 988  ? ?Patient verbalizes understanding of instructions and care plan provided today and agrees to view in [place]. Active MyChart status confirmed with patient.   ? ?Telephone follow up appointment with care management team member scheduled for: 05/08/22 ? ?Fontaine Kossman, LCSW ?Clinical Social Worker  ?[facility] ?[phone] ? ?

## 2022-04-11 NOTE — Chronic Care Management (AMB) (Signed)
?Chronic Care Management  ? ? Clinical Social Work Note ? ?04/11/2022 ?Name: April Luna MRN: [id number] DOB: 11/05/1971 ? ?April Luna is a 51 y.o. year old female who is a primary care patient of Steele Sizer, MD. The CCM team was consulted to assist the patient with chronic disease management and/or care coordination needs related to: Transportation Needs  and [employer] .  ? ?Engaged with patient by telephone for follow up visit in response to provider referral for social work chronic care management and care coordination services.  ? ?Consent to Services:  ?The patient was given information about Chronic Care Management services, agreed to services, and gave verbal consent prior to initiation of services.  Please see initial visit note for detailed documentation.  ? ?Patient agreed to services and consent obtained.  ? ?Assessment: Review of patient past medical history, allergies, medications, and health status, including review of relevant consultants reports was performed today as part of a comprehensive evaluation and provision of chronic care management and care coordination services.    ? ?SDOH (Social Determinants of Health) assessments and interventions performed:   ? ?Advanced Directives Status: Not addressed in this encounter. ? ?CCM Care Plan ? ?Allergies  ?Allergen Reactions  ? Metformin And Related Nausea And Vomiting  ? Perphenazine Other (See Comments)  ?  Tremors, muscle weakness, tongue swelling  ? Sulfa Antibiotics Other (See Comments)  ?  GI distress ?  ? Abilify [Aripiprazole] Rash  ? Penicillins Rash  ?  She has taken amoxicillin without problems  ? ? ?Outpatient Encounter Medications as of 04/11/2022  ?Medication Sig  ? amantadine (SYMMETREL) 100 MG capsule Take 100 mg by mouth 2 (two) times daily.  ? blood glucose meter kit and supplies Dispense based on patient and insurance preference. Use up to four times daily as directed. (FOR ICD-10 E10.9, E11.9).  ?  Budeson-Glycopyrrol-Formoterol (BREZTRI AEROSPHERE) 160-9-4.8 MCG/ACT AERO Inhale 2 puffs into the lungs in the morning and at bedtime.  ? buPROPion (WELLBUTRIN XL) 150 MG 24 hr tablet Take 150 mg by mouth daily.  ? Cholecalciferol (VITAMIN D-3) 125 MCG (5000 UT) TABS Take 5,000 Units by mouth daily.  ? dapagliflozin propanediol (FARXIGA) 10 MG TABS tablet Take 1 tablet (10 mg total) by mouth daily before breakfast.  ? gabapentin (NEURONTIN) 300 MG capsule Take 300 mg by mouth 2 (two) times daily.  ? hydrOXYzine (VISTARIL) 50 MG capsule Take 50 mg by mouth 3 (three) times daily as needed for anxiety.  ? icosapent Ethyl (VASCEPA) 1 g capsule Take 2 capsules (2 g total) by mouth 2 (two) times daily.  ? INVEGA SUSTENNA 234 MG/1.5ML SUSY injection Inject 1.5 mLs into the muscle every 30 (thirty) days. Inject as directed once a month  ? levothyroxine (SYNTHROID) 75 MCG tablet Take 1 tablet (75 mcg total) by mouth daily before breakfast.  ? linaclotide (LINZESS) 145 MCG CAPS capsule Take 1 capsule (145 mcg total) by mouth daily before breakfast.  ? loratadine (CLARITIN) 10 MG tablet Take 10 mg by mouth daily.  ? losartan (COZAAR) 25 MG tablet Take 1 tablet (25 mg total) by mouth daily.  ? nicotine (NICOTROL) 10 MG inhaler Inhale 1 Cartridge (1 continuous puffing total) into the lungs as needed for smoking cessation.  ? pantoprazole (PROTONIX) 40 MG tablet Take 1 tablet (40 mg total) by mouth daily.  ? rosuvastatin (CRESTOR) 40 MG tablet Take 1 tablet (40 mg total) by mouth daily.  ? sertraline (ZOLOFT) 100 MG tablet Take  200 mg by mouth daily.  ? ?No facility-administered encounter medications on file as of 04/11/2022.  ? ? ?Patient Active Problem List  ? Diagnosis Date Noted  ? Genetic testing 03/27/2022  ? Family history of colon cancer 03/14/2022  ? Family history of uterine cancer 03/14/2022  ? Family history of breast cancer 03/14/2022  ? Screening for colon cancer   ? Rectal mass   ? Polyp of ascending colon   ?  Sleep apnea 08/11/2021  ? Plantar callus 10/06/2019  ? Acquired hallux limitus of both feet 10/06/2019  ? Type 2 diabetes mellitus with microalbuminuria, without long-term current use of insulin ([place]) 06/24/2019  ? Chronic obstructive pulmonary disease ([place]) 06/24/2019  ? Chronic constipation 06/24/2019  ? ASCUS of cervix with negative high risk HPV 09/05/2018  ? GERD without esophagitis 04/11/2018  ? Hyperlipidemia 04/11/2018  ? Hypertension 04/11/2018  ? Hypothyroidism 04/26/2015  ? Bipolar I disorder, most recent episode (or current) manic ([place]) 04/25/2015  ? Delirium due to another medical condition 04/25/2015  ? Cannabis abuse 04/25/2015  ? ? ?Conditions to be addressed/monitored:  ?HTN, DMII, and Bipolar Disorder; Mental Health Concerns , transportation needs ?Care Plan : General Social Work (Adult)  ?Updates made by Vern Claude, LCSW since 04/11/2022 12:00 AM  ?  ? ?Problem: [facility] AMB "PATIENT-SPECIFIC PROBLEM"   ?Note:   ?CARE PLAN ENTRY ?(see longitudinal plan of care for additional care plan information) ? ?Current Barriers:  ?Patient with HTN, DM, and Bipolar Disorder in need of assistance with connection to community resources-patient states that she get's overwhelmed with the coordination of appointments and has a hard time following through ?Knowledge deficits and need for support, education and care coordination related to community resources support  ?Patient having difficulty keeping specialist appointments ? ?Clinical Goal(s)  ?Over the next  90  days patient will be able to schedule and keep medical appointments as demonstrated by increased ability to schedule both the upcoming appointments and arranging transportation to get there. ?Over the next 90 days, patient will continue to work with the ACT Team (community agency) to maintain adherence to medical appointments and medication management ? ?Interventions provided by LCSW:  ?Assessed patient's care coordination needs related to specialist  needs and discussed ongoing care management follow up  ?Patient requesting a review of medical appointments to ensure transportation ?Confirmed that ACT Team social worker agreeabe to assisting patient with transportation to medical appointments, however is having surgery on 04/21/22 at [name] and is not sure how she will get there ?Patient reminded of [facility] transportation benefit for routine appointments and possible transportation through [facility] for her procedure on 04/21/22-[place] contacted-patient needs to present at the hospital at 5:30am -patient agreeable to contacting [facility] to arrange transportation-collaboration call to [place] to discuss transportation options ?Emotional support provided, patient confirmed strong family and ACT Team support in regards to her diagnosis and transportation needs ?Emotional support and encouragement continues to be provided ?Patient able to remain hopeful and verbalizes appropriate use of self care strategies ?Positive reinforcement provided for motivation for close follow in regards to specialists needs. ?04/11/22 Follow up phone call to patient to confirm that transportation has been arranged for her upcoming surgery on 04/21/22 at [facility]. Patient confirmed that she has contacted her health plan and transportation arrangements have been made. They will arrive to pick her up on 04/21/22 at 4:00am. ? ?Patient Self Care Activities & Deficits:  ?Patient is unable to independently navigate community resource  options without care coordination support  ?Acknowledges deficits and is motivated to resolve concern  ?Patient is able to contact her Act Team  as discussed today for additional assistance with coordinating specialist appointments ?Unable to perform IADLs independently ?Performs ADL's independently ?Motivation for treatment ? ?Please see past updates related to this goal by clicking on the "Past Updates" button in the selected goal  ? ? ?  ?  ? ?Follow  Up Plan: SW will follow up with patient by phone over the next 30 business days ?     ?Esperansa Sarabia, LCSW ?Clinical Social Worker  ?[facility] Care Management ?[phone] ? ? ? ?

## 2022-04-12 ENCOUNTER — Encounter (HOSPITAL_COMMUNITY): Payer: Self-pay | Admitting: Surgery

## 2022-04-12 NOTE — Progress Notes (Addendum)
For Short Stay: ?[facility] appointment date: N/A ?Date of COVID positive in last 90 days: N/A ? ?Bowel Prep reminder: patient will begin pre on Thursday April 27 received from Dr. Orest Dikes office ? ? ?For Anesthesia: ?PCP - Steele Sizer, MD last office visit note 03/15/22 in epic ?Cardiologist - N/A ?Pulmonologist-Dr.  Halford Chessman last office visit 04/13/22 in epic ?Psychiatrist Dr. Loni Muse at [facility] ? ?Chest x-ray - greater than 1 year in epic ?CT chest/ab 03/03/22 in epic ?EKG - greater than 1 year ?Stress Test - N/A ?ECHO - N/A ?Cardiac Cath - N/A ?Pacemaker/ICD device last checked: N/A ?Pacemaker orders received: N/A ?Device Rep notified: N/A ? ?Spinal Cord Stimulator:  N/A ? ?Sleep Study - 03/09/21 ?CPAP - No (unable to afford) ? ?Fasting Blood Sugar - 100-125 ?Checks Blood Sugar __3___ times a day ?Date and result of last Hgb A1c- 03/15/22 7.3 ? ?Blood Thinner Instructions: N/A ?Aspirin Instructions: N/A ?Last Dose: N/A ? ?Activity level: Can go up a flight of stairs and activities of daily living without stopping and without chest pain and/or shortness of breath ?     ? ?Anesthesia review: COPD/smoker, DM, OSA, CKDstage 3a, HTN ? ?Patient denies shortness of breath, fever, cough and chest pain at PAT appointment ? ? ?Patient verbalized understanding of instructions that were given to them at the PAT appointment. Patient was also instructed that they will need to review over the PAT instructions again at home before surgery.  ?

## 2022-04-13 ENCOUNTER — Ambulatory Visit (INDEPENDENT_AMBULATORY_CARE_PROVIDER_SITE_OTHER): Payer: Medicare Other | Admitting: Pulmonary Disease

## 2022-04-13 ENCOUNTER — Encounter: Payer: Self-pay | Admitting: Pulmonary Disease

## 2022-04-13 VITALS — BP 128/66 | HR 77 | Temp 97.5°F | Ht 59.0 in | Wt 148.0 lb

## 2022-04-13 DIAGNOSIS — J411 Mucopurulent chronic bronchitis: Secondary | ICD-10-CM

## 2022-04-13 DIAGNOSIS — Z01811 Encounter for preprocedural respiratory examination: Secondary | ICD-10-CM | POA: Diagnosis not present

## 2022-04-13 DIAGNOSIS — F17210 Nicotine dependence, cigarettes, uncomplicated: Secondary | ICD-10-CM

## 2022-04-13 DIAGNOSIS — G4733 Obstructive sleep apnea (adult) (pediatric): Secondary | ICD-10-CM | POA: Diagnosis not present

## 2022-04-13 MED ORDER — GUAIFENESIN ER 600 MG PO TB12: 1200.0000 mg | ORAL_TABLET | Freq: Two times a day (BID) | ORAL | Status: DC | PRN

## 2022-04-13 MED ORDER — BREZTRI AEROSPHERE 160-9-4.8 MCG/ACT IN AERO: 2.0000 | INHALATION_SPRAY | Freq: Two times a day (BID) | RESPIRATORY_TRACT | 11 refills | Status: DC

## 2022-04-13 MED ORDER — AEROCHAMBER MV MISC: 0 refills | Status: AC

## 2022-04-13 MED ORDER — ALBUTEROL SULFATE HFA 108 (90 BASE) MCG/ACT IN AERS: 2.0000 | INHALATION_SPRAY | Freq: Four times a day (QID) | RESPIRATORY_TRACT | 5 refills | Status: DC | PRN

## 2022-04-13 MED ORDER — NICOTINE 7 MG/24HR TD PT24: 7.0000 mg | MEDICATED_PATCH | Freq: Every day | TRANSDERMAL | 1 refills | Status: DC

## 2022-04-13 MED ORDER — BREZTRI AEROSPHERE 160-9-4.8 MCG/ACT IN AERO: 2.0000 | INHALATION_SPRAY | Freq: Two times a day (BID) | RESPIRATORY_TRACT | 0 refills | Status: DC

## 2022-04-13 NOTE — Progress Notes (Signed)
? ?[facility] ? ?Chief Complaint  ?Patient presents with  ? Consult  ?  Hx of OSA-no cpap. C/o sob with exertion, occ prod cough with brown sputum and wheezing.   ? ? ?Past Surgical History:  ?She  has a past surgical history that includes Cesarean section; Mouth surgery; Cystoscopy/ureteroscopy/holmium laser/stent placement (Left, 11/08/2018); Cystoscopy w/ retrogrades (Left, 11/08/2018); Breast biopsy (Left, 12/14/2021); Colonoscopy with propofol (N/A, 02/09/2022); and polypectomy (N/A, 02/09/2022). ? ?Past Medical History:  ?Allergies, Anxiety, OA, Bipolar, Rectal Cancer, CKD, Depression, GERD, Nephrolithiasis, HLD, HTN, Hypothyroidism, Pneumonia, DM type 2 ? ?Constitutional:  ?BP 128/66 (BP Location: Left Arm, Cuff Size: Normal)   Pulse 77   Temp (!) 97.5 ?F (36.4 ?C) (Temporal)   Ht $R'4\' 11"'VV$  (1.499 m)   Wt 148 lb (67.1 kg)   SpO2 93%   BMI 29.89 kg/m?  ? ?Brief Summary:  ?April Luna is a 51 y.o. female smoker with chronic bronchitis and obstructive sleep apnea.  ?  ? ? ? ?Subjective:  ? ?She is here with her nurse aide. ? ?She is followed by Dr. Derrill Kay. ? ?She was found recently to have rectal cancer.  She is schedule for resection at [facility] later this month. ? ?She smokes 1/2 to 1 ppd.  She did well with nicotine replacement before regarding smoking cessation, but higher doses caused bad dreams. ? ?She has cough with clear to green sputum.  Occasionally has wheezing.  Not having fever, chest pain, or hemoptysis.  She has breztri, but hasn't been using on regular basis.  She doesn't have albuterol. ? ?She had sleep study last year that showed moderate sleep apnea.  She wasn't able to afford CPAP set up then.  She knows she has trouble with her sleep and snoring, and wants to get a CPAP machine. ? ?Physical Exam:  ? ?Appearance - well kempt  ? ?ENMT - no sinus tenderness, no oral exudate, no LAN, Mallampati 3 airway, no stridor, raspy  voice ? ?Respiratory - b/l rhonchi that clears with coughing, no wheeze ? ?CV - s1s2 regular rate and rhythm, no murmurs ? ?Ext - no clubbing, no edema ? ?Skin - no rashes ? ?Psych - normal mood and affect ?  ?Pulmonary testing:  ?Spirometry 04/11/18 >> FEV1 1.72 (72%), FEV1% 76 ? ?Chest Imaging:  ?CT chest 03/03/22 >> atherosclerosis ? ?Sleep Tests:  ?HST 03/09/21 >> AHI 21.1, SpO2 low 81% ? ?Social History:  ?She  reports that she has been smoking cigarettes. She started smoking about 36 years ago. She has a 68.00 pack-year smoking history. She has never used smokeless tobacco. She reports current alcohol use of about 4.0 standard drinks per week. She reports that she does not currently use drugs after having used the following drugs: Marijuana. ? ?Family History:  ?Her family history includes Anxiety disorder in her brother and mother; Breast cancer in her maternal grandmother; Cancer in her maternal aunt, mother, and paternal grandmother; Cervical cancer (age of onset: 34) in her mother; Colon cancer in her father; Depression in her brother and mother; Diabetes in her mother and paternal grandmother; Diabetes Mellitus II in her maternal grandmother; Hypercholesterolemia in her maternal grandmother; Hyperlipidemia in her mother; Hypertension in her mother; Melanoma in her paternal grandmother; Stomach cancer in her paternal grandmother; Uterine cancer (age of onset: 68) in her mother. ?  ? ? ?Assessment/Plan:  ? ?Chronic bronchitis with possible COPD. ?- will have her use breztri bid; sample  given ?- prn albuterol ?- will arrange for spacer device to use with inhalers ?- she will need PFTs at some point after she recovers from surgery ?- mucinex prn to help with cough and expectoration ?- don't think she needs prednisone or antibiotics at this time ? ?Obstructive sleep apnea. ?- reviewed her sleep study ?- will arrange for auto CPAP 5 to 15 cm H2O ?- she should be able to try CPAP in hospital after she has  surgery ? ?Tobacco abuse. ?- discussed non-pharmacologic methods to help with smoking cessation and dealing with stress ?- will have her try lower dose nicotine replacement at 7 mg to see if she tolerates this better ? ?Allergic rhinitis. ?- she reports allergy to cats, and has a pet cat ?- OTC claritin prn ? ?Rectal cancer. ?- she is scheduled for low anterior resection with colostomy on 04/21/22 with Dr. Nadeen Landau ?- reviewed the pulmonary risks related to her surgery ?- explained that continued tobacco abuse and untreated sleep apnea would increase her risk for perioperative complications ?- her pulmonary status has been optimized as best as possible at this time ?- she would likely need scheduled nebulizer therapy during the immediate post operative period and then transition to inhaler therapy ?- she should be tried on auto CPAP when sleeping while in the hospital and have close monitoring of her oxygenation ?- she can proceed with surgery ?- pulmonary team can be consulted at [facility] as needed after surgery if she develops respiratory complications ? ? ?Time Spent Involved in Patient Care on Day of Examination:  ?52 minutes ? ?Follow up:  ? ?Patient Instructions  ?Breztri two puffs in the morning and two puffs in the evening, and rinse your mouth after each use ? ?Albuterol two puffs every 6 hours as needed for cough, wheeze, chest congestion, or shortness of breath ? ?Use a spacer device with breztri and albuterol ? ?Mucinex 1200 mg twice per day as needed to help with cough and clearing phlegm ? ?Nicotine patch as needed to help quit smoking ? ?Will arrange for a CPAP machine ? ?Follow up in 4 months ? ?Medication List:  ? ?Allergies as of 04/13/2022   ? ?   Reactions  ? Metformin And Related Nausea And Vomiting  ? Perphenazine Other (See Comments)  ? Tremors, muscle weakness, tongue swelling  ? Sulfa Antibiotics Other (See Comments)  ? GI distress  ? Abilify [aripiprazole] Rash  ?  Penicillins Rash  ? She has taken amoxicillin without problems  ? ?  ? ?  ?Medication List  ?  ? ?  ? Accurate as of April 13, 2022  1:00 PM. If you have any questions, ask your nurse or doctor.  ?  ?  ? ?  ? ?STOP taking these medications   ? ?nicotine 10 MG inhaler ?Commonly known as: Nicotrol ?Replaced by: nicotine 7 mg/24hr patch ?Stopped by: Chesley Mires, MD ?  ? ?  ? ?TAKE these medications   ? ?AeroChamber MV inhaler ?Use as instructed ?Started by: Chesley Mires, MD ?  ?albuterol 108 (90 Base) MCG/ACT inhaler ?Commonly known as: Ventolin HFA ?Inhale 2 puffs into the lungs every 6 (six) hours as needed for wheezing or shortness of breath. ?Started by: Chesley Mires, MD ?  ?amantadine 100 MG capsule ?Commonly known as: SYMMETREL ?Take 100 mg by mouth 2 (two) times daily. ?  ?blood glucose meter kit and supplies ?Dispense based on patient and insurance preference. Use up to four  times daily as directed. (FOR ICD-10 E10.9, E11.9). ?  ?Breztri Aerosphere 160-9-4.8 MCG/ACT Aero ?Generic drug: Budeson-Glycopyrrol-Formoterol ?Inhale 2 puffs into the lungs in the morning and at bedtime. ?What changed: Another medication with the same name was added. Make sure you understand how and when to take each. ?Changed by: Chesley Mires, MD ?  ?Breztri Aerosphere 160-9-4.8 MCG/ACT Aero ?Generic drug: Budeson-Glycopyrrol-Formoterol ?Inhale 2 puffs into the lungs in the morning and at bedtime. ?What changed: You were already taking a medication with the same name, and this prescription was added. Make sure you understand how and when to take each. ?Changed by: Chesley Mires, MD ?  ?buPROPion 150 MG 24 hr tablet ?Commonly known as: WELLBUTRIN XL ?Take 150 mg by mouth daily. ?  ?dapagliflozin propanediol 10 MG Tabs tablet ?Commonly known as: [place] ?Take 1 tablet (10 mg total) by mouth daily before breakfast. ?  ?gabapentin 300 MG capsule ?Commonly known as: NEURONTIN ?Take 300 mg by mouth 2 (two) times daily. ?  ?guaiFENesin 600 MG 12 hr  tablet ?Commonly known as: Mucinex ?Take 2 tablets (1,200 mg total) by mouth 2 (two) times daily as needed for to loosen phlegm or cough. ?Started by: Chesley Mires, MD ?  ?hydrOXYzine 50 MG capsule ?Commonly known as: VISTARI

## 2022-04-13 NOTE — Patient Instructions (Signed)
Breztri two puffs in the morning and two puffs in the evening, and rinse your mouth after each use ? ?Albuterol two puffs every 6 hours as needed for cough, wheeze, chest congestion, or shortness of breath ? ?Use a spacer device with breztri and albuterol ? ?Mucinex 1200 mg twice per day as needed to help with cough and clearing phlegm ? ?Nicotine patch as needed to help quit smoking ? ?Will arrange for a CPAP machine ? ?Follow up in 4 months ?

## 2022-04-14 ENCOUNTER — Other Ambulatory Visit (HOSPITAL_COMMUNITY): Payer: Self-pay

## 2022-04-17 ENCOUNTER — Encounter (HOSPITAL_COMMUNITY): Payer: Self-pay | Admitting: Surgery

## 2022-04-17 ENCOUNTER — Other Ambulatory Visit: Payer: Self-pay

## 2022-04-17 ENCOUNTER — Ambulatory Visit: Payer: Self-pay | Admitting: Surgery

## 2022-04-17 DIAGNOSIS — E119 Type 2 diabetes mellitus without complications: Secondary | ICD-10-CM

## 2022-04-17 DIAGNOSIS — Z01818 Encounter for other preprocedural examination: Secondary | ICD-10-CM

## 2022-04-18 ENCOUNTER — Other Ambulatory Visit (HOSPITAL_COMMUNITY): Payer: Self-pay

## 2022-04-21 ENCOUNTER — Inpatient Hospital Stay (HOSPITAL_COMMUNITY): Payer: Medicare Other | Admitting: Physician Assistant

## 2022-04-21 ENCOUNTER — Encounter (HOSPITAL_COMMUNITY): Payer: Self-pay | Admitting: Surgery

## 2022-04-21 ENCOUNTER — Other Ambulatory Visit: Payer: Self-pay

## 2022-04-21 ENCOUNTER — Encounter (HOSPITAL_COMMUNITY)
Admission: RE | Disposition: A | Discharge status: Home Health | Payer: Self-pay | Source: Home / Self Care | Attending: Surgery

## 2022-04-21 ENCOUNTER — Inpatient Hospital Stay (HOSPITAL_COMMUNITY)
Admission: RE | Admit: 2022-04-21 | Discharge: 2022-04-24 | DRG: 330 | Disposition: A | Discharge status: Home Health | Payer: Medicare Other | Attending: Surgery | Admitting: Surgery

## 2022-04-21 DIAGNOSIS — F419 Anxiety disorder, unspecified: Secondary | ICD-10-CM | POA: Diagnosis present

## 2022-04-21 DIAGNOSIS — Z8249 Family history of ischemic heart disease and other diseases of the circulatory system: Secondary | ICD-10-CM

## 2022-04-21 DIAGNOSIS — F17210 Nicotine dependence, cigarettes, uncomplicated: Secondary | ICD-10-CM | POA: Diagnosis not present

## 2022-04-21 DIAGNOSIS — M1990 Unspecified osteoarthritis, unspecified site: Secondary | ICD-10-CM | POA: Diagnosis present

## 2022-04-21 DIAGNOSIS — Z818 Family history of other mental and behavioral disorders: Secondary | ICD-10-CM

## 2022-04-21 DIAGNOSIS — Z7984 Long term (current) use of oral hypoglycemic drugs: Secondary | ICD-10-CM

## 2022-04-21 DIAGNOSIS — G4730 Sleep apnea, unspecified: Secondary | ICD-10-CM

## 2022-04-21 DIAGNOSIS — Z87442 Personal history of urinary calculi: Secondary | ICD-10-CM

## 2022-04-21 DIAGNOSIS — E1129 Type 2 diabetes mellitus with other diabetic kidney complication: Principal | ICD-10-CM

## 2022-04-21 DIAGNOSIS — Z800 Family history of malignant neoplasm of digestive organs: Secondary | ICD-10-CM

## 2022-04-21 DIAGNOSIS — E039 Hypothyroidism, unspecified: Secondary | ICD-10-CM | POA: Diagnosis present

## 2022-04-21 DIAGNOSIS — E785 Hyperlipidemia, unspecified: Secondary | ICD-10-CM | POA: Diagnosis present

## 2022-04-21 DIAGNOSIS — R159 Full incontinence of feces: Secondary | ICD-10-CM | POA: Diagnosis present

## 2022-04-21 DIAGNOSIS — R809 Proteinuria, unspecified: Secondary | ICD-10-CM

## 2022-04-21 DIAGNOSIS — Z83438 Family history of other disorder of lipoprotein metabolism and other lipidemia: Secondary | ICD-10-CM | POA: Diagnosis not present

## 2022-04-21 DIAGNOSIS — Z881 Allergy status to other antibiotic agents status: Secondary | ICD-10-CM

## 2022-04-21 DIAGNOSIS — K219 Gastro-esophageal reflux disease without esophagitis: Secondary | ICD-10-CM | POA: Diagnosis present

## 2022-04-21 DIAGNOSIS — I10 Essential (primary) hypertension: Secondary | ICD-10-CM

## 2022-04-21 DIAGNOSIS — Z803 Family history of malignant neoplasm of breast: Secondary | ICD-10-CM

## 2022-04-21 DIAGNOSIS — C772 Secondary and unspecified malignant neoplasm of intra-abdominal lymph nodes: Secondary | ICD-10-CM | POA: Diagnosis present

## 2022-04-21 DIAGNOSIS — F319 Bipolar disorder, unspecified: Secondary | ICD-10-CM | POA: Diagnosis present

## 2022-04-21 DIAGNOSIS — J449 Chronic obstructive pulmonary disease, unspecified: Secondary | ICD-10-CM | POA: Diagnosis not present

## 2022-04-21 DIAGNOSIS — Z8049 Family history of malignant neoplasm of other genital organs: Secondary | ICD-10-CM | POA: Diagnosis not present

## 2022-04-21 DIAGNOSIS — Z01818 Encounter for other preprocedural examination: Secondary | ICD-10-CM

## 2022-04-21 DIAGNOSIS — C20 Malignant neoplasm of rectum: Secondary | ICD-10-CM

## 2022-04-21 DIAGNOSIS — E119 Type 2 diabetes mellitus without complications: Secondary | ICD-10-CM

## 2022-04-21 DIAGNOSIS — Z888 Allergy status to other drugs, medicaments and biological substances status: Secondary | ICD-10-CM

## 2022-04-21 DIAGNOSIS — Z808 Family history of malignant neoplasm of other organs or systems: Secondary | ICD-10-CM | POA: Diagnosis not present

## 2022-04-21 DIAGNOSIS — Z833 Family history of diabetes mellitus: Secondary | ICD-10-CM

## 2022-04-21 DIAGNOSIS — Z880 Allergy status to penicillin: Secondary | ICD-10-CM | POA: Diagnosis not present

## 2022-04-21 DIAGNOSIS — N261 Atrophy of kidney (terminal): Secondary | ICD-10-CM | POA: Diagnosis present

## 2022-04-21 DIAGNOSIS — Z933 Colostomy status: Secondary | ICD-10-CM

## 2022-04-21 HISTORY — PX: XI ROBOTIC ASSISTED LOWER ANTERIOR RESECTION: SHX6558

## 2022-04-21 HISTORY — DX: Malignant (primary) neoplasm, unspecified: C80.1

## 2022-04-21 HISTORY — DX: Psoriasis, unspecified: L40.9

## 2022-04-21 HISTORY — DX: Panic disorder (episodic paroxysmal anxiety): F41.0

## 2022-04-21 LAB — BASIC METABOLIC PANEL
Anion gap: 7 (ref 5–15)
BUN: 6 mg/dL (ref 6–20)
CO2: 23 mmol/L (ref 22–32)
Calcium: 9 mg/dL (ref 8.9–10.3)
Chloride: 106 mmol/L (ref 98–111)
Creatinine, Ser: 0.94 mg/dL (ref 0.44–1.00)
GFR, Estimated: >60 mL/min (ref >60)
Glucose, Bld: 163 mg/dL — ABNORMAL HIGH (ref 70–99)
Potassium: 3.6 mmol/L (ref 3.5–5.1)
Sodium: 136 mmol/L (ref 135–145)

## 2022-04-21 LAB — CBC
HCT: 42.7 % (ref 36.0–46.0)
Hemoglobin: 15 g/dL (ref 12.0–15.0)
MCH: 33.1 pg (ref 26.0–34.0)
MCHC: 35.1 g/dL (ref 30.0–36.0)
MCV: 94.3 fL (ref 80.0–100.0)
Platelets: 172 10*3/uL (ref 150–400)
RBC: 4.53 MIL/uL (ref 3.87–5.11)
RDW: 13.2 % (ref 11.5–15.5)
WBC: 13.7 10*3/uL — ABNORMAL HIGH (ref 4.0–10.5)
nRBC: 0 % (ref 0.0–0.2)

## 2022-04-21 LAB — GLUCOSE, CAPILLARY
Glucose-Capillary: 172 mg/dL — ABNORMAL HIGH (ref 70–99)
Glucose-Capillary: 197 mg/dL — ABNORMAL HIGH (ref 70–99)
Glucose-Capillary: 211 mg/dL — ABNORMAL HIGH (ref 70–99)
Glucose-Capillary: 132 mg/dL — ABNORMAL HIGH (ref 70–99)

## 2022-04-21 SURGERY — RESECTION, RECTUM, LOW ANTERIOR, ROBOT-ASSISTED
Anesthesia: General

## 2022-04-21 MED ORDER — HEPARIN SODIUM (PORCINE) 5000 UNIT/ML IJ SOLN
5000.0000 [IU] | Freq: Once | INTRAMUSCULAR | Status: AC
  Administered 2022-04-21: 5000 [IU] via SUBCUTANEOUS
  Filled 2022-04-21: qty 1

## 2022-04-21 MED ORDER — LEVOTHYROXINE SODIUM 75 MCG PO TABS
75.0000 ug | ORAL_TABLET | Freq: Every day | ORAL | Status: DC
  Administered 2022-04-22 – 2022-04-24 (×3): 75 ug via ORAL
  Filled 2022-04-21 (×3): qty 1

## 2022-04-21 MED ORDER — KETAMINE HCL 50 MG/5ML IJ SOSY
PREFILLED_SYRINGE | INTRAMUSCULAR | Status: AC
  Filled 2022-04-21: qty 5

## 2022-04-21 MED ORDER — HYDRALAZINE HCL 20 MG/ML IJ SOLN: 10.0000 mg | INTRAMUSCULAR | Status: DC | PRN

## 2022-04-21 MED ORDER — LACTATED RINGERS IV SOLN: INTRAVENOUS | Status: DC

## 2022-04-21 MED ORDER — MOMETASONE FURO-FORMOTEROL FUM 200-5 MCG/ACT IN AERO
2.0000 | INHALATION_SPRAY | Freq: Two times a day (BID) | RESPIRATORY_TRACT | Status: DC
Start: 2022-04-21 — End: 2022-04-21
  Filled 2022-04-21: qty 8.8

## 2022-04-21 MED ORDER — METRONIDAZOLE 500 MG PO TABS
1000.0000 mg | ORAL_TABLET | ORAL | Status: DC
Start: 2022-04-21 — End: 2022-04-21
  Filled 2022-04-21 (×2): qty 2

## 2022-04-21 MED ORDER — UMECLIDINIUM BROMIDE 62.5 MCG/ACT IN AEPB
1.0000 | INHALATION_SPRAY | Freq: Every day | RESPIRATORY_TRACT | Status: DC
  Filled 2022-04-21: qty 7

## 2022-04-21 MED ORDER — ROCURONIUM BROMIDE 10 MG/ML (PF) SYRINGE
PREFILLED_SYRINGE | INTRAVENOUS | Status: AC
  Filled 2022-04-21: qty 10

## 2022-04-21 MED ORDER — INSULIN ASPART 100 UNIT/ML IJ SOLN: 0.0000 [IU] | Freq: Every day | INTRAMUSCULAR | Status: DC

## 2022-04-21 MED ORDER — HYDROXYZINE HCL 25 MG PO TABS
50.0000 mg | ORAL_TABLET | Freq: Three times a day (TID) | ORAL | Status: DC | PRN
  Administered 2022-04-22 – 2022-04-24 (×5): 50 mg via ORAL
  Filled 2022-04-21 (×5): qty 2

## 2022-04-21 MED ORDER — TRAMADOL HCL 50 MG PO TABS: 50.0000 mg | ORAL_TABLET | Freq: Four times a day (QID) | ORAL | 0 refills | Status: AC | PRN

## 2022-04-21 MED ORDER — BUPIVACAINE-EPINEPHRINE (PF) 0.25% -1:200000 IJ SOLN
INTRAMUSCULAR | Status: DC | PRN
  Administered 2022-04-21: 30 mL via PERINEURAL

## 2022-04-21 MED ORDER — ORAL CARE MOUTH RINSE: 15.0000 mL | Freq: Once | OROMUCOSAL | Status: AC

## 2022-04-21 MED ORDER — ALVIMOPAN 12 MG PO CAPS
12.0000 mg | ORAL_CAPSULE | Freq: Two times a day (BID) | ORAL | Status: DC
Start: 2022-04-22 — End: 2022-04-23
  Administered 2022-04-22: 12 mg via ORAL
  Filled 2022-04-21 (×2): qty 1

## 2022-04-21 MED ORDER — FENTANYL CITRATE (PF) 100 MCG/2ML IJ SOLN
INTRAMUSCULAR | Status: DC | PRN
Start: 2022-04-21 — End: 2022-04-21
  Administered 2022-04-21 (×2): 50 ug via INTRAVENOUS

## 2022-04-21 MED ORDER — PROPOFOL 10 MG/ML IV BOLUS
INTRAVENOUS | Status: DC | PRN
Start: 2022-04-21 — End: 2022-04-21
  Administered 2022-04-21: 150 mg via INTRAVENOUS

## 2022-04-21 MED ORDER — DIPHENHYDRAMINE HCL 50 MG/ML IJ SOLN
INTRAMUSCULAR | Status: DC | PRN
  Administered 2022-04-21: 12.5 mg via INTRAVENOUS

## 2022-04-21 MED ORDER — SODIUM CHLORIDE 0.9 % IV SOLN
2.0000 g | INTRAVENOUS | Status: AC
  Administered 2022-04-21: 2 g via INTRAVENOUS

## 2022-04-21 MED ORDER — AMANTADINE HCL 100 MG PO CAPS
100.0000 mg | ORAL_CAPSULE | Freq: Two times a day (BID) | ORAL | Status: DC
  Administered 2022-04-21 – 2022-04-24 (×6): 100 mg via ORAL
  Filled 2022-04-21 (×6): qty 1

## 2022-04-21 MED ORDER — SPY AGENT GREEN - (INDOCYANINE FOR INJECTION)
INTRAMUSCULAR | Status: DC | PRN
  Administered 2022-04-21: 1 mL via INTRAVENOUS

## 2022-04-21 MED ORDER — ACETAMINOPHEN 500 MG PO TABS: 1000.0000 mg | ORAL_TABLET | ORAL | Status: AC

## 2022-04-21 MED ORDER — LIDOCAINE HCL (PF) 2 % IJ SOLN
INTRAMUSCULAR | Status: AC
  Filled 2022-04-21: qty 5

## 2022-04-21 MED ORDER — NICOTINE 7 MG/24HR TD PT24
7.0000 mg | MEDICATED_PATCH | Freq: Every day | TRANSDERMAL | Status: DC
  Administered 2022-04-21 – 2022-04-22 (×2): 7 mg via TRANSDERMAL
  Filled 2022-04-21 (×2): qty 1

## 2022-04-21 MED ORDER — MOMETASONE FURO-FORMOTEROL FUM 200-5 MCG/ACT IN AERO
2.0000 | INHALATION_SPRAY | Freq: Two times a day (BID) | RESPIRATORY_TRACT | Status: DC
  Administered 2022-04-21 – 2022-04-24 (×6): 2 via RESPIRATORY_TRACT
  Filled 2022-04-21: qty 8.8

## 2022-04-21 MED ORDER — ACETAMINOPHEN 500 MG PO TABS
1000.0000 mg | ORAL_TABLET | Freq: Four times a day (QID) | ORAL | Status: DC
  Administered 2022-04-21 – 2022-04-24 (×10): 1000 mg via ORAL
  Filled 2022-04-21 (×10): qty 2

## 2022-04-21 MED ORDER — NEOMYCIN SULFATE 500 MG PO TABS
1000.0000 mg | ORAL_TABLET | ORAL | Status: DC
  Filled 2022-04-21 (×2): qty 2

## 2022-04-21 MED ORDER — BISACODYL 5 MG PO TBEC: 20.0000 mg | DELAYED_RELEASE_TABLET | Freq: Once | ORAL | Status: DC

## 2022-04-21 MED ORDER — LINACLOTIDE 145 MCG PO CAPS
145.0000 ug | ORAL_CAPSULE | Freq: Every day | ORAL | Status: DC
  Administered 2022-04-23: 145 ug via ORAL
  Filled 2022-04-21 (×3): qty 1

## 2022-04-21 MED ORDER — DAPAGLIFLOZIN PROPANEDIOL 10 MG PO TABS
10.0000 mg | ORAL_TABLET | Freq: Every day | ORAL | Status: DC
  Administered 2022-04-22 – 2022-04-24 (×3): 10 mg via ORAL
  Filled 2022-04-21 (×3): qty 1

## 2022-04-21 MED ORDER — ALUM & MAG HYDROXIDE-SIMETH 200-200-20 MG/5ML PO SUSP: 30.0000 mL | Freq: Four times a day (QID) | ORAL | Status: DC | PRN

## 2022-04-21 MED ORDER — LIDOCAINE HCL (CARDIAC) PF 100 MG/5ML IV SOSY
PREFILLED_SYRINGE | INTRAVENOUS | Status: DC | PRN
  Administered 2022-04-21: 80 mg via INTRAVENOUS

## 2022-04-21 MED ORDER — FENTANYL CITRATE PF 50 MCG/ML IJ SOSY: 25.0000 ug | PREFILLED_SYRINGE | INTRAMUSCULAR | Status: DC | PRN

## 2022-04-21 MED ORDER — UMECLIDINIUM BROMIDE 62.5 MCG/ACT IN AEPB
1.0000 | INHALATION_SPRAY | Freq: Every day | RESPIRATORY_TRACT | Status: DC
  Administered 2022-04-22 – 2022-04-24 (×3): 1 via RESPIRATORY_TRACT

## 2022-04-21 MED ORDER — ALBUTEROL SULFATE (2.5 MG/3ML) 0.083% IN NEBU: 2.5000 mg | INHALATION_SOLUTION | Freq: Four times a day (QID) | RESPIRATORY_TRACT | Status: DC | PRN

## 2022-04-21 MED ORDER — CHLORHEXIDINE GLUCONATE CLOTH 2 % EX PADS
6.0000 | MEDICATED_PAD | Freq: Once | CUTANEOUS | Status: DC
Start: 2022-04-21 — End: 2022-04-21

## 2022-04-21 MED ORDER — ONDANSETRON HCL 4 MG/2ML IJ SOLN: 4.0000 mg | Freq: Four times a day (QID) | INTRAMUSCULAR | Status: DC | PRN

## 2022-04-21 MED ORDER — ENSURE PRE-SURGERY PO LIQD
296.0000 mL | Freq: Once | ORAL | Status: DC
  Filled 2022-04-21: qty 296

## 2022-04-21 MED ORDER — ALVIMOPAN 12 MG PO CAPS: 12.0000 mg | ORAL_CAPSULE | ORAL | Status: AC

## 2022-04-21 MED ORDER — PROPOFOL 10 MG/ML IV BOLUS
INTRAVENOUS | Status: AC
  Filled 2022-04-21: qty 20

## 2022-04-21 MED ORDER — PHENYLEPHRINE HCL (PRESSORS) 10 MG/ML IV SOLN
INTRAVENOUS | Status: AC
  Filled 2022-04-21: qty 1

## 2022-04-21 MED ORDER — PANTOPRAZOLE SODIUM 40 MG PO TBEC
40.0000 mg | DELAYED_RELEASE_TABLET | Freq: Every day | ORAL | Status: DC
  Administered 2022-04-22 – 2022-04-24 (×3): 40 mg via ORAL
  Filled 2022-04-21 (×3): qty 1

## 2022-04-21 MED ORDER — HYDROMORPHONE HCL 1 MG/ML IJ SOLN: 0.5000 mg | INTRAMUSCULAR | Status: DC | PRN

## 2022-04-21 MED ORDER — GUAIFENESIN ER 600 MG PO TB12
1200.0000 mg | ORAL_TABLET | Freq: Two times a day (BID) | ORAL | Status: DC | PRN
  Administered 2022-04-23: 1200 mg via ORAL
  Filled 2022-04-21: qty 2

## 2022-04-21 MED ORDER — DEXAMETHASONE SODIUM PHOSPHATE 10 MG/ML IJ SOLN
INTRAMUSCULAR | Status: DC | PRN
  Administered 2022-04-21: 10 mg via INTRAVENOUS

## 2022-04-21 MED ORDER — SODIUM CHLORIDE 0.9 % IR SOLN
Status: DC | PRN
  Administered 2022-04-21: 1000 mL

## 2022-04-21 MED ORDER — FENTANYL CITRATE PF 50 MCG/ML IJ SOSY
PREFILLED_SYRINGE | INTRAMUSCULAR | Status: AC
  Filled 2022-04-21: qty 1

## 2022-04-21 MED ORDER — TRAMADOL HCL 50 MG PO TABS
50.0000 mg | ORAL_TABLET | Freq: Four times a day (QID) | ORAL | Status: DC | PRN
  Administered 2022-04-21 – 2022-04-24 (×9): 50 mg via ORAL
  Filled 2022-04-21 (×9): qty 1

## 2022-04-21 MED ORDER — LACTATED RINGERS IR SOLN
Status: DC | PRN
  Administered 2022-04-21: 1000 mL

## 2022-04-21 MED ORDER — ONDANSETRON HCL 4 MG PO TABS: 4.0000 mg | ORAL_TABLET | Freq: Four times a day (QID) | ORAL | Status: DC | PRN

## 2022-04-21 MED ORDER — LACTATED RINGERS IV SOLN: INTRAVENOUS | Status: DC | PRN

## 2022-04-21 MED ORDER — PHENYLEPHRINE 80 MCG/ML (10ML) SYRINGE FOR IV PUSH (FOR BLOOD PRESSURE SUPPORT)
PREFILLED_SYRINGE | INTRAVENOUS | Status: DC | PRN
  Administered 2022-04-21: 120 ug via INTRAVENOUS
  Administered 2022-04-21 (×2): 80 ug via INTRAVENOUS
  Administered 2022-04-21: 120 ug via INTRAVENOUS

## 2022-04-21 MED ORDER — LIDOCAINE HCL (PF) 2 % IJ SOLN
INTRAMUSCULAR | Status: DC | PRN
  Administered 2022-04-21: 1.5 mg/kg/h via INTRADERMAL

## 2022-04-21 MED ORDER — MIDAZOLAM HCL 2 MG/2ML IJ SOLN
INTRAMUSCULAR | Status: AC
  Filled 2022-04-21: qty 2

## 2022-04-21 MED ORDER — DIPHENHYDRAMINE HCL 50 MG/ML IJ SOLN: 12.5000 mg | Freq: Four times a day (QID) | INTRAMUSCULAR | Status: DC | PRN

## 2022-04-21 MED ORDER — VITAMIN D3 25 MCG (1000 UNIT) PO TABS
5000.0000 [IU] | ORAL_TABLET | Freq: Every day | ORAL | Status: DC
  Administered 2022-04-22 – 2022-04-24 (×3): 5000 [IU] via ORAL
  Filled 2022-04-21 (×3): qty 5

## 2022-04-21 MED ORDER — ACETAMINOPHEN 500 MG PO TABS
ORAL_TABLET | ORAL | Status: AC
  Administered 2022-04-21: 1000 mg via ORAL
  Filled 2022-04-21: qty 2

## 2022-04-21 MED ORDER — ONDANSETRON HCL 4 MG/2ML IJ SOLN
INTRAMUSCULAR | Status: AC
  Filled 2022-04-21: qty 2

## 2022-04-21 MED ORDER — DIPHENHYDRAMINE HCL 12.5 MG/5ML PO ELIX: 12.5000 mg | ORAL_SOLUTION | Freq: Four times a day (QID) | ORAL | Status: DC | PRN

## 2022-04-21 MED ORDER — BUDESON-GLYCOPYRROL-FORMOTEROL 160-9-4.8 MCG/ACT IN AERO: 2.0000 | INHALATION_SPRAY | Freq: Two times a day (BID) | RESPIRATORY_TRACT | Status: DC

## 2022-04-21 MED ORDER — SODIUM CHLORIDE (PF) 0.9 % IJ SOLN
INTRAMUSCULAR | Status: AC
  Filled 2022-04-21: qty 10

## 2022-04-21 MED ORDER — BUPIVACAINE LIPOSOME 1.3 % IJ SUSP
INTRAMUSCULAR | Status: AC
  Filled 2022-04-21: qty 20

## 2022-04-21 MED ORDER — ROSUVASTATIN CALCIUM 20 MG PO TABS
40.0000 mg | ORAL_TABLET | Freq: Every day | ORAL | Status: DC
  Administered 2022-04-22 – 2022-04-24 (×3): 40 mg via ORAL
  Filled 2022-04-21 (×3): qty 2

## 2022-04-21 MED ORDER — INSULIN ASPART 100 UNIT/ML IJ SOLN
0.0000 [IU] | Freq: Three times a day (TID) | INTRAMUSCULAR | Status: DC
  Administered 2022-04-21 – 2022-04-22 (×2): 3 [IU] via SUBCUTANEOUS
  Administered 2022-04-22 – 2022-04-23 (×2): 2 [IU] via SUBCUTANEOUS
  Administered 2022-04-23: 1 [IU] via SUBCUTANEOUS
  Administered 2022-04-23: 2 [IU] via SUBCUTANEOUS
  Administered 2022-04-24: 1 [IU] via SUBCUTANEOUS

## 2022-04-21 MED ORDER — SERTRALINE HCL 100 MG PO TABS
200.0000 mg | ORAL_TABLET | Freq: Every day | ORAL | Status: DC
  Administered 2022-04-22 – 2022-04-24 (×3): 200 mg via ORAL
  Filled 2022-04-21 (×3): qty 2

## 2022-04-21 MED ORDER — ALBUMIN HUMAN 5 % IV SOLN: INTRAVENOUS | Status: DC | PRN

## 2022-04-21 MED ORDER — ONDANSETRON HCL 4 MG/2ML IJ SOLN: 4.0000 mg | Freq: Once | INTRAMUSCULAR | Status: DC | PRN

## 2022-04-21 MED ORDER — ENSURE PRE-SURGERY PO LIQD
592.0000 mL | Freq: Once | ORAL | Status: DC
  Filled 2022-04-21: qty 592

## 2022-04-21 MED ORDER — ICOSAPENT ETHYL 1 G PO CAPS
2.0000 g | ORAL_CAPSULE | Freq: Two times a day (BID) | ORAL | Status: DC
  Administered 2022-04-21 – 2022-04-24 (×6): 2 g via ORAL
  Filled 2022-04-21 (×6): qty 2

## 2022-04-21 MED ORDER — MIDAZOLAM HCL 5 MG/5ML IJ SOLN
INTRAMUSCULAR | Status: DC | PRN
  Administered 2022-04-21: 1 mg via INTRAVENOUS

## 2022-04-21 MED ORDER — CHLORHEXIDINE GLUCONATE CLOTH 2 % EX PADS: 6.0000 | MEDICATED_PAD | Freq: Once | CUTANEOUS | Status: DC

## 2022-04-21 MED ORDER — PHENYLEPHRINE HCL-NACL 20-0.9 MG/250ML-% IV SOLN
INTRAVENOUS | Status: DC | PRN
  Administered 2022-04-21: 50 ug/min via INTRAVENOUS

## 2022-04-21 MED ORDER — SUGAMMADEX SODIUM 200 MG/2ML IV SOLN
INTRAVENOUS | Status: DC | PRN
  Administered 2022-04-21: 200 mg via INTRAVENOUS

## 2022-04-21 MED ORDER — LORATADINE 10 MG PO TABS
10.0000 mg | ORAL_TABLET | Freq: Every day | ORAL | Status: DC
  Administered 2022-04-21 – 2022-04-24 (×4): 10 mg via ORAL
  Filled 2022-04-21 (×4): qty 1

## 2022-04-21 MED ORDER — DEXAMETHASONE SODIUM PHOSPHATE 10 MG/ML IJ SOLN
INTRAMUSCULAR | Status: AC
  Filled 2022-04-21: qty 1

## 2022-04-21 MED ORDER — HEPARIN SODIUM (PORCINE) 5000 UNIT/ML IJ SOLN
5000.0000 [IU] | Freq: Three times a day (TID) | INTRAMUSCULAR | Status: DC
  Administered 2022-04-21 – 2022-04-24 (×9): 5000 [IU] via SUBCUTANEOUS
  Filled 2022-04-21 (×9): qty 1

## 2022-04-21 MED ORDER — ALVIMOPAN 12 MG PO CAPS
ORAL_CAPSULE | ORAL | Status: AC
Start: 2022-04-21 — End: 2022-04-21
  Administered 2022-04-21: 12 mg via ORAL
  Filled 2022-04-21: qty 1

## 2022-04-21 MED ORDER — GABAPENTIN 300 MG PO CAPS
300.0000 mg | ORAL_CAPSULE | Freq: Two times a day (BID) | ORAL | Status: DC
  Administered 2022-04-21 – 2022-04-24 (×6): 300 mg via ORAL
  Filled 2022-04-21 (×6): qty 1

## 2022-04-21 MED ORDER — SCOPOLAMINE 1 MG/3DAYS TD PT72
1.0000 | MEDICATED_PATCH | Freq: Once | TRANSDERMAL | Status: AC
  Administered 2022-04-21: 1.5 mg via TRANSDERMAL
  Filled 2022-04-21: qty 1

## 2022-04-21 MED ORDER — SIMETHICONE 80 MG PO CHEW: 40.0000 mg | CHEWABLE_TABLET | Freq: Four times a day (QID) | ORAL | Status: DC | PRN

## 2022-04-21 MED ORDER — BUPROPION HCL ER (XL) 150 MG PO TB24
150.0000 mg | ORAL_TABLET | Freq: Every day | ORAL | Status: DC
  Administered 2022-04-22 – 2022-04-24 (×3): 150 mg via ORAL
  Filled 2022-04-21 (×3): qty 1

## 2022-04-21 MED ORDER — BUPIVACAINE-EPINEPHRINE (PF) 0.25% -1:200000 IJ SOLN
INTRAMUSCULAR | Status: AC
  Filled 2022-04-21: qty 30

## 2022-04-21 MED ORDER — BUPIVACAINE LIPOSOME 1.3 % IJ SUSP
INTRAMUSCULAR | Status: DC | PRN
Start: 2022-04-21 — End: 2022-04-21
  Administered 2022-04-21: 20 mL

## 2022-04-21 MED ORDER — ENSURE SURGERY PO LIQD
237.0000 mL | Freq: Two times a day (BID) | ORAL | Status: DC
  Administered 2022-04-22 – 2022-04-24 (×4): 237 mL via ORAL

## 2022-04-21 MED ORDER — FENTANYL CITRATE PF 50 MCG/ML IJ SOSY
25.0000 ug | PREFILLED_SYRINGE | INTRAMUSCULAR | Status: DC | PRN
  Administered 2022-04-21: 50 ug via INTRAVENOUS

## 2022-04-21 MED ORDER — SODIUM CHLORIDE 0.9 % IV SOLN
INTRAVENOUS | Status: AC
  Filled 2022-04-21: qty 2

## 2022-04-21 MED ORDER — IBUPROFEN 400 MG PO TABS
400.0000 mg | ORAL_TABLET | Freq: Four times a day (QID) | ORAL | Status: DC | PRN
  Filled 2022-04-21: qty 1

## 2022-04-21 MED ORDER — ROCURONIUM BROMIDE 10 MG/ML (PF) SYRINGE
PREFILLED_SYRINGE | INTRAVENOUS | Status: DC | PRN
  Administered 2022-04-21: 30 mg via INTRAVENOUS
  Administered 2022-04-21: 70 mg via INTRAVENOUS

## 2022-04-21 MED ORDER — ONDANSETRON HCL 4 MG/2ML IJ SOLN
INTRAMUSCULAR | Status: DC | PRN
  Administered 2022-04-21: 4 mg via INTRAVENOUS

## 2022-04-21 MED ORDER — CHLORHEXIDINE GLUCONATE 0.12 % MT SOLN
15.0000 mL | Freq: Once | OROMUCOSAL | Status: AC
  Administered 2022-04-21: 15 mL via OROMUCOSAL

## 2022-04-21 MED ORDER — LOSARTAN POTASSIUM 25 MG PO TABS
25.0000 mg | ORAL_TABLET | Freq: Every day | ORAL | Status: DC
Start: 2022-04-22 — End: 2022-04-24
  Administered 2022-04-22 – 2022-04-24 (×3): 25 mg via ORAL
  Filled 2022-04-21 (×3): qty 1

## 2022-04-21 MED ORDER — KETAMINE HCL 10 MG/ML IJ SOLN
INTRAMUSCULAR | Status: DC | PRN
  Administered 2022-04-21: 30 mg via INTRAVENOUS

## 2022-04-21 MED ORDER — BUPIVACAINE LIPOSOME 1.3 % IJ SUSP: 20.0000 mL | Freq: Once | INTRAMUSCULAR | Status: DC

## 2022-04-21 MED ORDER — FENTANYL CITRATE (PF) 100 MCG/2ML IJ SOLN
INTRAMUSCULAR | Status: AC
  Filled 2022-04-21: qty 2

## 2022-04-21 MED ORDER — POLYETHYLENE GLYCOL 3350 17 GM/SCOOP PO POWD
1.0000 | Freq: Once | ORAL | Status: DC
Start: 2022-04-21 — End: 2022-04-21
  Filled 2022-04-21: qty 255

## 2022-04-21 SURGICAL SUPPLY — 119 items
APPLIER CLIP 5 13 M/L LIGAMAX5 (MISCELLANEOUS)
APPLIER CLIP ROT 10 11.4 M/L (STAPLE)
BAG COUNTER SPONGE SURGICOUNT (BAG) IMPLANT
BLADE EXTENDED COATED 6.5IN (ELECTRODE) ×2 IMPLANT
CANNULA REDUC XI 12-8 STAPL (CANNULA) ×1
CANNULA REDUCER 12-8 DVNC XI (CANNULA) ×1 IMPLANT
CELLS DAT CNTRL 66122 CELL SVR (MISCELLANEOUS) IMPLANT
CHLORAPREP W/TINT 26 (MISCELLANEOUS) ×2 IMPLANT
CLIP APPLIE 5 13 M/L LIGAMAX5 (MISCELLANEOUS) IMPLANT
CLIP APPLIE ROT 10 11.4 M/L (STAPLE) IMPLANT
CLIP LIGATING HEM O LOK PURPLE (MISCELLANEOUS) IMPLANT
CLIP LIGATING HEMO O LOK GREEN (MISCELLANEOUS) IMPLANT
COVER SURGICAL LIGHT HANDLE (MISCELLANEOUS) ×4 IMPLANT
COVER TIP SHEARS 8 DVNC (MISCELLANEOUS) ×1 IMPLANT
COVER TIP SHEARS 8MM DA VINCI (MISCELLANEOUS) ×1
DEFOGGER SCOPE WARMER CLEARIFY (MISCELLANEOUS) ×2 IMPLANT
DERMABOND ADVANCED (GAUZE/BANDAGES/DRESSINGS) ×1
DERMABOND ADVANCED .7 DNX12 (GAUZE/BANDAGES/DRESSINGS) IMPLANT
DEVICE TROCAR PUNCTURE CLOSURE (ENDOMECHANICALS) IMPLANT
DRAIN CHANNEL 19F RND (DRAIN) ×2 IMPLANT
DRAPE ARM DVNC X/XI (DISPOSABLE) ×4 IMPLANT
DRAPE COLUMN DVNC XI (DISPOSABLE) ×1 IMPLANT
DRAPE DA VINCI XI ARM (DISPOSABLE) ×4
DRAPE DA VINCI XI COLUMN (DISPOSABLE) ×1
DRAPE SURG IRRIG POUCH 19X23 (DRAPES) ×2 IMPLANT
DRSG OPSITE POSTOP 4X10 (GAUZE/BANDAGES/DRESSINGS) IMPLANT
DRSG OPSITE POSTOP 4X6 (GAUZE/BANDAGES/DRESSINGS) IMPLANT
DRSG OPSITE POSTOP 4X8 (GAUZE/BANDAGES/DRESSINGS) IMPLANT
DRSG TEGADERM 2-3/8X2-3/4 SM (GAUZE/BANDAGES/DRESSINGS) ×10 IMPLANT
DRSG TEGADERM 4X4.75 (GAUZE/BANDAGES/DRESSINGS) ×2 IMPLANT
ELECT REM PT RETURN 15FT ADLT (MISCELLANEOUS) ×2 IMPLANT
ENDOLOOP SUT PDS II  0 18 (SUTURE)
ENDOLOOP SUT PDS II 0 18 (SUTURE) IMPLANT
EVACUATOR SILICONE 100CC (DRAIN) ×2 IMPLANT
GAUZE SPONGE 2X2 8PLY STRL LF (GAUZE/BANDAGES/DRESSINGS) ×1 IMPLANT
GAUZE SPONGE 4X4 12PLY STRL (GAUZE/BANDAGES/DRESSINGS) IMPLANT
GLOVE BIO SURGEON STRL SZ7.5 (GLOVE) ×6 IMPLANT
GLOVE INDICATOR 8.0 STRL GRN (GLOVE) ×6 IMPLANT
GOWN STRL REUS W/ TWL XL LVL3 (GOWN DISPOSABLE) ×5 IMPLANT
GOWN STRL REUS W/TWL XL LVL3 (GOWN DISPOSABLE) ×5
GRASPER SUT TROCAR 14GX15 (MISCELLANEOUS) IMPLANT
HOLDER FOLEY CATH W/STRAP (MISCELLANEOUS) ×2 IMPLANT
IRRIG SUCT STRYKERFLOW 2 WTIP (MISCELLANEOUS) ×2
IRRIGATION SUCT STRKRFLW 2 WTP (MISCELLANEOUS) ×1 IMPLANT
KIT PROCEDURE DA VINCI SI (MISCELLANEOUS)
KIT PROCEDURE DVNC SI (MISCELLANEOUS) IMPLANT
KIT TURNOVER KIT A (KITS) IMPLANT
NDL INSUFFLATION 14GA 120MM (NEEDLE) ×1 IMPLANT
NEEDLE INSUFFLATION 14GA 120MM (NEEDLE) ×2 IMPLANT
PACK CARDIOVASCULAR III (CUSTOM PROCEDURE TRAY) ×2 IMPLANT
PACK COLON (CUSTOM PROCEDURE TRAY) ×2 IMPLANT
PAD POSITIONING PINK XL (MISCELLANEOUS) ×2 IMPLANT
PENCIL SMOKE EVACUATOR (MISCELLANEOUS) IMPLANT
PROTECTOR NERVE ULNAR (MISCELLANEOUS) ×4 IMPLANT
RELOAD STAPLE 45 3.5 BLU DVNC (STAPLE) IMPLANT
RELOAD STAPLE 45 4.3 GRN DVNC (STAPLE) IMPLANT
RELOAD STAPLE 60 3.5 BLU DVNC (STAPLE) IMPLANT
RELOAD STAPLE 60 4.3 GRN DVNC (STAPLE) IMPLANT
RELOAD STAPLER 3.5X45 BLU DVNC (STAPLE) IMPLANT
RELOAD STAPLER 3.5X60 BLU DVNC (STAPLE) IMPLANT
RELOAD STAPLER 4.3X45 GRN DVNC (STAPLE) IMPLANT
RELOAD STAPLER 4.3X60 GRN DVNC (STAPLE) ×2 IMPLANT
RETRACTOR WND ALEXIS 18 MED (MISCELLANEOUS) IMPLANT
RTRCTR WOUND ALEXIS 18CM MED (MISCELLANEOUS)
SCISSORS LAP 5X35 DISP (ENDOMECHANICALS) IMPLANT
SEAL CANN UNIV 5-8 DVNC XI (MISCELLANEOUS) ×4 IMPLANT
SEAL XI 5MM-8MM UNIVERSAL (MISCELLANEOUS) ×4
SEALER VESSEL DA VINCI XI (MISCELLANEOUS) ×1
SEALER VESSEL EXT DVNC XI (MISCELLANEOUS) ×1 IMPLANT
SLEEVE ADV FIXATION 5X100MM (TROCAR) IMPLANT
SOLUTION ELECTROLUBE (MISCELLANEOUS) ×2 IMPLANT
SPIKE FLUID TRANSFER (MISCELLANEOUS) ×2 IMPLANT
SPONGE GAUZE 2X2 STER 10/PKG (GAUZE/BANDAGES/DRESSINGS) ×1
STAPLER 60 DA VINCI SURE FORM (STAPLE) ×1
STAPLER 60 SUREFORM DVNC (STAPLE) IMPLANT
STAPLER CANNULA SEAL DVNC XI (STAPLE) ×1 IMPLANT
STAPLER CANNULA SEAL XI (STAPLE) ×1
STAPLER ECHELON POWER CIR 29 (STAPLE) IMPLANT
STAPLER ECHELON POWER CIR 31 (STAPLE) IMPLANT
STAPLER PROXIMATE 75MM BLUE (STAPLE) ×1 IMPLANT
STAPLER RELOAD 3.5X45 BLU DVNC (STAPLE)
STAPLER RELOAD 3.5X45 BLUE (STAPLE)
STAPLER RELOAD 3.5X60 BLU DVNC (STAPLE)
STAPLER RELOAD 3.5X60 BLUE (STAPLE)
STAPLER RELOAD 4.3X45 GREEN (STAPLE)
STAPLER RELOAD 4.3X45 GRN DVNC (STAPLE)
STAPLER RELOAD 4.3X60 GREEN (STAPLE) ×2
STAPLER RELOAD 4.3X60 GRN DVNC (STAPLE) ×2
STOPCOCK 4 WAY LG BORE MALE ST (IV SETS) ×4 IMPLANT
SURGILUBE 2OZ TUBE FLIPTOP (MISCELLANEOUS) ×2 IMPLANT
SUT MNCRL AB 4-0 PS2 18 (SUTURE) ×2 IMPLANT
SUT PDS AB 1 CT1 27 (SUTURE) ×2 IMPLANT
SUT PDS AB 1 TP1 96 (SUTURE) IMPLANT
SUT PROLENE 0 CT 2 (SUTURE) IMPLANT
SUT PROLENE 2 0 KS (SUTURE) ×2 IMPLANT
SUT PROLENE 2 0 SH DA (SUTURE) IMPLANT
SUT SILK 2 0 (SUTURE)
SUT SILK 2 0 SH CR/8 (SUTURE) IMPLANT
SUT SILK 2-0 18XBRD TIE 12 (SUTURE) IMPLANT
SUT SILK 3 0 (SUTURE) ×1
SUT SILK 3 0 SH CR/8 (SUTURE) ×2 IMPLANT
SUT SILK 3-0 18XBRD TIE 12 (SUTURE) ×1 IMPLANT
SUT V-LOC BARB 180 2/0GR6 GS22 (SUTURE)
SUT VIC AB 3-0 SH 18 (SUTURE) IMPLANT
SUT VIC AB 3-0 SH 27 (SUTURE)
SUT VIC AB 3-0 SH 27XBRD (SUTURE) IMPLANT
SUT VICRYL 0 UR6 27IN ABS (SUTURE) ×2 IMPLANT
SUTURE V-LC BRB 180 2/0GR6GS22 (SUTURE) IMPLANT
SYR 10ML LL (SYRINGE) ×2 IMPLANT
SYS LAPSCP GELPORT 120MM (MISCELLANEOUS)
SYS WOUND ALEXIS 18CM MED (MISCELLANEOUS) ×2
SYSTEM LAPSCP GELPORT 120MM (MISCELLANEOUS) IMPLANT
SYSTEM WOUND ALEXIS 18CM MED (MISCELLANEOUS) ×1 IMPLANT
TAPE UMBILICAL 1/8 X36 TWILL (MISCELLANEOUS) ×2 IMPLANT
TOWEL OR NON WOVEN STRL DISP B (DISPOSABLE) ×2 IMPLANT
TRAY FOLEY MTR SLVR 16FR STAT (SET/KITS/TRAYS/PACK) ×2 IMPLANT
TROCAR ADV FIXATION 5X100MM (TROCAR) ×2 IMPLANT
TUBING CONNECTING 10 (TUBING) ×4 IMPLANT
TUBING INSUFFLATION 10FT LAP (TUBING) ×2 IMPLANT

## 2022-04-21 NOTE — Anesthesia Procedure Notes (Signed)
Procedure Name: Intubation ?Date/Time: 04/21/2022 7:39 AM ?Performed by: Lind Covert, CRNA ?Pre-anesthesia Checklist: Patient identified, Emergency Drugs available, Suction available, Patient being monitored and Timeout performed ?Patient Re-evaluated:Patient Re-evaluated prior to induction ?Oxygen Delivery Method: Circle system utilized ?Preoxygenation: Pre-oxygenation with 100% oxygen ?Induction Type: IV induction ?Ventilation: Mask ventilation without difficulty ?Laryngoscope Size: Glidescope, Mac and 3 (elective, poor dentition) ?Tube size: 7.0 mm ?Number of attempts: 1 ?Airway Equipment and Method: Stylet ?Placement Confirmation: ETT inserted through vocal cords under direct vision, positive ETCO2 and breath sounds checked- equal and bilateral ?Secured at: 22 cm ?Tube secured with: Tape ?Dental Injury: Teeth and Oropharynx as per pre-operative assessment  ? ? ? ? ?

## 2022-04-21 NOTE — H&P (Addendum)
? ?CC: Here for surgery ? ?HPI: ?April Luna is an 51 y.o. female with history of HTN, HLD, DM, hypothyroidism, GERD, seasonal Allergies, tobacco use whom is seen in the office today as a referral by Dr. Tasia Catchings for evaluation of rectal cancer ? ?First colonoscopy completed - Dr. Marius Ditch 02/09/22 - distal rectal mass (by my review just above first valve of [place]), biopsy showed mod diff adenocarcinoma. 5 mm polyp in asc colon removed, tubular adenoma ? ?CT CAP 03/02/22 - no evidence of metastatic disease; prominent left sided retroperitoneal LNs up to 57m near renal hilum but decreased relative to 08/2018, favored reactive related to left renal inflammation; atrophic left kidney ? ?MRI Pelvis 02/26/22 -anteriorly based rectal mass that is 3 cm above the internal anal sphincter. CmriT1/T2N0; no EMVI, peritoneal reflection invasion or involvement of adjacent organs ? ?Labs 02/17/22: ?Alb 4.4 ?Cr 1.11 ?Hgb 15.8 ?Plt 175 ? ?CEA 3.6 ? ?She denies any complaints today. Otherwise feels well. She denies any symptoms preceding her colonoscopy or since. Reports regular bowel movements without any evident bleeding or pain. ? ?She reports significant issues with fecal incontinence to both gas and liquid stool. She reports that if her stool is solid she generally has good control and does not have to wear a diaper but does have to find a bathroom quickly. If she has issues with diarrhea, she will wear a padded and/or diaper because she will have frequent accidents during that time. ? ?She denies any changes in her health or health hx since we met in the office. States she is ready for surgery. Expresses understanding about surgical plan including colostomy which will likely be permanent.  ? ?PMH: HTN, HLD, DM, hypothyroidism, GERD, seasonal allergies ? ?PSH: C-section ? ?FHx: Denies any known family history of colorectal, breast, endometrial or ovarian cancer ? ?Social Hx: Smokes 1 pack per day; drinks 1-2 glasses of wine every  couple of weeks; denies illicit drug. She lives at home. She reports that she is not working. She lives with her husband and 175year old son. Her husband works in [employer] ? ?Past Medical History:  ?Diagnosis Date  ? Allergy   ? pollen  ? Anxiety   ? Arthritis   ? right hip  ? Bipolar 1 disorder ([facility] [place]   ? Cancer ([facility]   ? rectal  ? Chronic kidney disease   ? COPD (chronic obstructive pulmonary disease) ([facility]   ? Depression   ? Family history of adverse reaction to anesthesia   ? grand father had a stroke during anesthesia  ? Family history of breast cancer   ? Family history of colon cancer   ? Family history of uterine cancer   ? GERD (gastroesophageal reflux disease)   ? History of kidney stones   ? Hyperlipidemia   ? Hypertension   ? Hypothyroidism   ? Panic attack   ? Pneumonia   ? Psoriasis   ? Sleep apnea 08/11/2021  ? No CPAP  ? Type 2 diabetes mellitus with microalbuminuria, without long-term current use of insulin ([facility] 06/24/2019  ? ? ?Past Surgical History:  ?Procedure Laterality Date  ? BREAST BIOPSY Left 12/14/2021  ? uKoreabx, venus marker, path pending  ? CESAREAN SECTION    ? COLONOSCOPY WITH PROPOFOL N/A 02/09/2022  ? Procedure: COLONOSCOPY WITH PROPOFOL;  Surgeon: VLin Landsman MD;  [facility]  Service: Endoscopy;  Laterality: N/A;  sleep apnea  ? CYSTOSCOPY W/ RETROGRADES Left 11/08/2018  ? Procedure:  CYSTOSCOPY WITH RETROGRADE PYELOGRAM;  Surgeon: Billey Co, MD;  Location: [facility];  Service: Urology;  Laterality: Left;  ? CYSTOSCOPY/URETEROSCOPY/HOLMIUM LASER/STENT PLACEMENT Left 11/08/2018  ? Procedure: CYSTOSCOPY/URETEROSCOPY/HOLMIUM LASER/STENT PLACEMENT;  Surgeon: Billey Co, MD;  Location: [facility];  Service: Urology;  Laterality: Left;  ? MOUTH SURGERY    ? wisdom teeth extraction  ? MOUTH SURGERY    ? teeth removal  ? POLYPECTOMY N/A 02/09/2022  ? Procedure: POLYPECTOMY;  Surgeon: Lin Landsman, MD;  Location: [facility];   Service: Endoscopy;  Laterality: N/A;  ? ? ?Family History  ?Problem Relation Age of Onset  ? Depression Mother   ? Anxiety disorder Mother   ? Diabetes Mother   ? Hypertension Mother   ? Hyperlipidemia Mother   ? Cancer Mother   ? Uterine cancer Mother 86  ? Cervical cancer Mother 47  ? Colon cancer Father   ? Depression Brother   ? Anxiety disorder Brother   ? Cancer Maternal Aunt   ?     unk types  ? Diabetes Mellitus II Maternal Grandmother   ? Hypercholesterolemia Maternal Grandmother   ? Breast cancer Maternal Grandmother   ? Cancer Paternal Grandmother   ? Diabetes Paternal Grandmother   ? Melanoma Paternal Grandmother   ? Stomach cancer Paternal Grandmother   ? ? ?Social:  reports that she has been smoking cigarettes. She started smoking about 36 years ago. She has a 68.00 pack-year smoking history. She has never used smokeless tobacco. She reports current alcohol use of about 4.0 standard drinks per week. She reports that she does not currently use drugs after having used the following drugs: Marijuana. ? ?Allergies:  ?Allergies  ?Allergen Reactions  ? Metformin And Related Nausea And Vomiting  ? Nsaids   ?  Stage 3 CKD  ? Perphenazine Other (See Comments)  ?  Tremors, muscle weakness, tongue swelling  ? Sulfa Antibiotics Other (See Comments)  ?  GI distress ?  ? Abilify [Aripiprazole] Rash  ? Penicillins Rash  ?  She has taken amoxicillin without problems  ? ? ?Medications: I have reviewed the patient's current medications. ? ?Results for orders placed or performed during the hospital encounter of 04/21/22 (from the past 48 hour(s))  ?Basic metabolic panel per protocol     Status: Abnormal  ? Collection Time: 04/21/22  6:32 AM  ?Result Value Ref Range  ? Sodium 136 135 - 145 mmol/L  ? Potassium 3.6 3.5 - 5.1 mmol/L  ? Chloride 106 98 - 111 mmol/L  ? CO2 23 22 - 32 mmol/L  ? Glucose, Bld 163 (H) 70 - 99 mg/dL  ?  Comment: Glucose reference range applies only to samples taken after fasting for at least 8  hours.  ? BUN 6 6 - 20 mg/dL  ? Creatinine, Ser 0.94 0.44 - 1.00 mg/dL  ? Calcium 9.0 8.9 - 10.3 mg/dL  ? GFR, Estimated >60 >60 mL/min  ?  Comment: (NOTE) ?Calculated using the CKD-EPI Creatinine Equation (2021) ?  ? Anion gap 7 5 - 15  ?  Comment: Performed at [facility], [place] [address] [place], [place] [address]  ?CBC per protocol     Status: Abnormal  ? Collection Time: 04/21/22  6:32 AM  ?Result Value Ref Range  ? WBC 13.7 (H) 4.0 - 10.5 K/uL  ? RBC 4.53 3.87 - 5.11 MIL/uL  ? Hemoglobin 15.0 12.0 - 15.0 g/dL  ? HCT 42.7 36.0 - 46.0 %  ?  MCV 94.3 80.0 - 100.0 fL  ? MCH 33.1 26.0 - 34.0 pg  ? MCHC 35.1 30.0 - 36.0 g/dL  ? RDW 13.2 11.5 - 15.5 %  ? Platelets 172 150 - 400 K/uL  ? nRBC 0.0 0.0 - 0.2 %  ?  Comment: Performed at [facility], [place] [address] [place], [place] [address]  ?Glucose, capillary     Status: Abnormal  ? Collection Time: 04/21/22  6:39 AM  ?Result Value Ref Range  ? Glucose-Capillary 172 (H) 70 - 99 mg/dL  ?  Comment: Glucose reference range applies only to samples taken after fasting for at least 8 hours.  ? ? ?No results found. ? ?ROS - all of the below systems have been reviewed with the patient and positives are indicated with bold text ?General: chills, fever or night sweats ?Eyes: blurry vision or double vision ?ENT: epistaxis or sore throat ?Allergy/Immunology: itchy/watery eyes or nasal congestion ?Hematologic/Lymphatic: bleeding problems, blood clots or swollen lymph nodes ?Endocrine: temperature intolerance or unexpected weight changes ?Breast: new or changing breast lumps or nipple discharge ?Resp: cough, shortness of breath, or wheezing ?CV: chest pain or dyspnea on exertion ?GI: as per HPI ?GU: dysuria, trouble voiding, or hematuria ?MSK: joint pain or joint stiffness ?Neuro: TIA or stroke symptoms ?Derm: pruritus and skin lesion changes ?Psych: anxiety and depression ? ?PE ?Blood pressure 138/88, pulse 98, temperature 98.1 ?F (36.7 ?C),  temperature source Oral, resp. rate 20, height '4\' 11"'  (1.499 m), weight 67.1 kg, SpO2 92 %. ?Constitutional: NAD; conversant; no deformities ?Eyes: Moist conjunctiva; no lid lag; anicteric; PERRL ?Neck: Tr

## 2022-04-21 NOTE — Discharge Instructions (Signed)
POST OP INSTRUCTIONS AFTER COLON SURGERY ? ?DIET: Be sure to include lots of fluids daily to stay hydrated - 64oz of water per day (8, 8 oz glasses).  Avoid fast food or heavy meals for the first couple of weeks as your are more likely to get nauseated. Avoid raw/uncooked fruits or vegetables for the first 4 weeks (its ok to have these if they are blended into smoothie form). If you have fruits/vegetables, make sure they are cooked until soft enough to mash on the roof of your mouth and chew your food well. Otherwise, diet as tolerated. ? ?Take your usually prescribed home medications unless otherwise directed. ? ?PAIN CONTROL: ?Pain is best controlled by a usual combination of three different methods TOGETHER: ?Ice/Heat ?Over the counter pain medication ?Prescription pain medication ?Most patients will experience some swelling and bruising around the surgical site.  Ice packs or heating pads (30-60 minutes up to 6 times a day) will help. Some people prefer to use ice alone, heat alone, alternating between ice & heat.  Experiment to what works for you.  Swelling and bruising can take several weeks to resolve.   ?It is helpful to take an over-the-counter pain medication regularly for the first few weeks: ?Ibuprofen (Motrin/Advil) - '200mg'$  tabs - take 3 tabs ('600mg'$ ) every 6 hours as needed for pain (unless you have been directed previously to avoid NSAIDs/ibuprofen) ?Acetaminophen (Tylenol) - you may take '650mg'$  every 6 hours as needed. You can take this with motrin as they act differently on the body. If you are taking a narcotic pain medication that has acetaminophen in it, do not take over the counter tylenol at the same time. ?NOTE: You may take both of these medications together - most patients  find it most helpful when alternating between the two (i.e. Ibuprofen at 6am, tylenol at 9am, ibuprofen at 12pm ...) ?A  prescription for pain medication should be given to you upon discharge.  Take your pain medication as  prescribed if your pain is not adequatly controlled with the over-the-counter pain reliefs mentioned above. ? ?Avoid getting constipated.  Between the surgery and the pain medications, it is common to experience some constipation.  Increasing fluid intake and taking a fiber supplement (such as Metamucil, Citrucel, FiberCon, MiraLax, etc) 1-2 times a day regularly will usually help prevent this problem from occurring.  A mild laxative (prune juice, Milk of Magnesia, MiraLax, etc) should be taken according to package directions if there are no bowel movements after 48 hours.   ? ?Dressing: Your incisions are covered in Dermabond which is like sterile superglue for the skin. This will come off on it's own in a couple weeks. It is waterproof and you may bathe normally starting the day after your surgery in a shower. Avoid baths/pools/lakes/oceans until your wounds have fully healed. With regards to your colostomy, continue to apply appliance as instructed with our wound ostomy team. Cut the bag to fit where it is snug but not tight but protects the skin. Generally people empty the bag when it is ~1/3 to 1/2 full. Burp gas whenever it begins to accumulate. Most patients change the appliance where it sticks to the skin 2-3 times per week. A referral has been made to the wound ostomy clinic as well to assist in transition to home and we have worked to arrange home health as well. ? ?ACTIVITIES as tolerated:   ?Avoid heavy lifting (>10lbs or 1 gallon of milk) for the next 6 weeks. ?You may  resume regular daily activities as tolerated--such as daily self-care, walking, climbing stairs--gradually increasing activities as tolerated.  If you can walk 30 minutes without difficulty, it is safe to try more intense activity such as jogging, treadmill, bicycling, low-impact aerobics.  ?DO NOT PUSH THROUGH PAIN.  Let pain be your guide: If it hurts to do something, don't do it. ?You may drive when you are no longer taking  prescription pain medication, you can comfortably wear a seatbelt, and you can safely maneuver your car and apply brakes. ? ?FOLLOW UP in our office ?Please call [facility] at [phone] to set up an appointment to see your surgeon in the office for a follow-up appointment approximately 2 weeks after your surgery. ?Make sure that you call for this appointment the day you arrive home to insure a convenient appointment time. ? ?9. If you have disability or family leave forms that need to be completed, you may have them completed by your primary care physician's office; for return to work instructions, please ask our office staff and they will be happy to assist you in obtaining this documentation ?  ?When to call us [phone]: ?Poor pain control ?Reactions / problems with new medications (rash/itching, etc)  ?Fever over 101.5 F (38.5 C) ?Inability to urinate ?Nausea/vomiting ?Worsening swelling or bruising ?Continued bleeding from incision. ?Increased pain, redness, or drainage from the incision ? ?The clinic staff is available to answer your questions during regular business hours (8:30am-5pm).  Please don?t hesitate to call and ask to speak to one of our nurses for clinical concerns.   A surgeon from [facility] is always on call at the hospitals ?  ?If you have a medical emergency, go to the nearest emergency room or call 911. ? ?[facility], [place] ?[address] [place], [place], [place]  [address] ?MAIN: [phone] ?FAX: [phone] ?[email or web address] ?

## 2022-04-21 NOTE — Consult Note (Signed)
[facility] Nurse ostomy consult note: POD 0 ?Consult received for new end colostomy created intraoperatively this morning by Dr. Dema Severin. ? ?[place] Nurse will see Monday, 04/24/22. ? ?[facility] nursing team will follow, and will remain available to this patient, the nursing and medical teams.  ?Thank you for involving [place] in this patient's POC. ? ?Maudie Flakes, MSN, RN, [place], [place], CWON-AP, [place]  ?Pager# [phone]  ?

## 2022-04-21 NOTE — Anesthesia Preprocedure Evaluation (Addendum)
Anesthesia Evaluation  ?Patient identified by MRN, date of birth, ID band ?Patient awake ? ? ? ?Reviewed: ?Allergy & Precautions, NPO status , Patient's Chart, lab work & pertinent test results ? ?Airway ?Mallampati: III ? ?TM Distance: >3 FB ?Neck ROM: Full ? ? ? Dental ? ?(+) Dental Advisory Given, Poor Dentition, Missing, Chipped ?  ?Pulmonary ?sleep apnea , COPD,  COPD inhaler, Current SmokerPatient did not abstain from smoking.,  ?  ?Pulmonary exam normal ?breath sounds clear to auscultation ? ? ? ? ? ? Cardiovascular ?hypertension, Pt. on medications ?Normal cardiovascular exam ?Rhythm:Regular Rate:Normal ? ? ?  ?Neuro/Psych ?PSYCHIATRIC DISORDERS Anxiety Depression Bipolar Disorder negative neurological ROS ?   ? GI/Hepatic ?Neg liver ROS, GERD  Medicated,Rectal cancer ? ?  ?Endo/Other  ?diabetes, Type 2, Oral Hypoglycemic AgentsHypothyroidism  ? Renal/GU ?Renal InsufficiencyRenal disease  ? ?  ?Musculoskeletal ? ?(+) Arthritis ,  ? Abdominal ?  ?Peds ? Hematology ?negative hematology ROS ?(+)   ?Anesthesia Other Findings ?Day of surgery medications reviewed with the patient. ? Reproductive/Obstetrics ? ?  ? ? ? ? ? ? ? ? ? ? ? ? ? ?  ?  ? ? ? ? ? ? ? ?Anesthesia Physical ?Anesthesia Plan ? ?ASA: 3 ? ?Anesthesia Plan: General  ? ?Post-op Pain Management: Ofirmev IV (intra-op)*  ? ?Induction: Intravenous ? ?PONV Risk Score and Plan: 3 and Midazolam, Dexamethasone, Ondansetron and Diphenhydramine ? ?Airway Management Planned: Oral ETT ? ?Additional Equipment:  ? ?Intra-op Plan:  ? ?Post-operative Plan: Extubation in OR ? ?Informed Consent: I have reviewed the patients History and Physical, chart, labs and discussed the procedure including the risks, benefits and alternatives for the proposed anesthesia with the patient or authorized representative who has indicated his/her understanding and acceptance.  ? ? ? ?Dental advisory given ? ?Plan Discussed with: CRNA ? ?Anesthesia  Plan Comments: (2nd PIV after induction)  ? ? ? ? ? ? ?Anesthesia Quick Evaluation ? ?

## 2022-04-21 NOTE — Evaluation (Signed)
Physical Therapy Evaluation ?Patient Details ?Name: April Luna ?MRN: [id number] ?DOB: 29-Feb-1971 ?Today's Date: 04/21/2022 ? ?History of Present Illness ? 51 y.o. female with history of HTN, HLD, DM, hypothyroidism, GERD, seasonal Allergies, tobacco use, rectal cancer admitted for end colostomy 04/21/22.  ?Clinical Impression ? Instructed pt in log roll to minimize strain to incision with bed mobility. She was able to perform this independently. She was also independent with transfers and with ambulating 120' without a device, no loss of balance. No further PT indicated, will sign off. Encouraged pt to ambulate several times daily in the halls.    ?   ? ?Recommendations for follow up therapy are one component of a multi-disciplinary discharge planning process, led by the attending physician.  Recommendations may be updated based on patient status, additional functional criteria and insurance authorization. ? ?Follow Up Recommendations No PT follow up ? ?  ?Assistance Recommended at Discharge Set up Supervision/Assistance  ?Patient can return home with the following ? Assistance with cooking/housework ? ?  ?Equipment Recommendations None recommended by PT  ?Recommendations for Other Services ?    ?  ?Functional Status Assessment Patient has not had a recent decline in their functional status  ? ?  ?Precautions / Restrictions Precautions ?Precautions: Other (comment) ?Precaution Comments: abdominal surgery ?Restrictions ?Weight Bearing Restrictions: No  ? ?  ? ?Mobility ? Bed Mobility ?Overal bed mobility: Independent ?  ?  ?  ?  ?  ?  ?General bed mobility comments: instructed pt in log roll to minimize strain to incision ?  ? ?Transfers ?Overall transfer level: Independent ?  ?  ?  ?  ?  ?  ?  ?  ?  ?  ? ?Ambulation/Gait ?Ambulation/Gait assistance: Independent ?Gait Distance (Feet): 120 Feet ?Assistive device: None ?Gait Pattern/deviations: WFL(Within Functional Limits) ?Gait velocity: WFL ?  ?  ?General Gait  Details: steady, no loss of balance, no increase in pain with activity ? ?Stairs ?  ?  ?  ?  ?  ? ?Wheelchair Mobility ?  ? ?Modified Rankin (Stroke Patients Only) ?  ? ?  ? ?Balance Overall balance assessment: Independent ?  ?  ?  ?  ?  ?  ?  ?  ?  ?  ?  ?  ?  ?  ?  ?  ?  ?  ?   ? ? ? ?Pertinent Vitals/Pain Pain Assessment ?Pain Assessment: 0-10 ?Pain Score: 5  ?Pain Location: incision site lower abdomen ?Pain Descriptors / Indicators: Sore ?Pain Intervention(s): Limited activity within patient's tolerance, Patient requesting pain meds-RN notified, Monitored during session  ? ? ?Home Living Family/patient expects to be discharged to:: Private residence ?Living Arrangements: Spouse/significant other;Children ?Available Help at Discharge: Family ?Type of Home: Apartment ?Home Access: Stairs to enter ?Entrance Stairs-Rails: Right ?Entrance Stairs-Number of Steps: flight ?  ?Home Layout: One level ?Home Equipment: None ?   ?  ?Prior Function Prior Level of Function : Independent/Modified Independent ?  ?  ?  ?  ?  ?  ?  ?  ?  ? ? ?Hand Dominance  ?   ? ?  ?Extremity/Trunk Assessment  ? Upper Extremity Assessment ?Upper Extremity Assessment: Overall WFL for tasks assessed ?  ? ?Lower Extremity Assessment ?Lower Extremity Assessment: Overall WFL for tasks assessed ?  ? ?Cervical / Trunk Assessment ?Cervical / Trunk Assessment: Normal  ?Communication  ? Communication: No difficulties  ?Cognition Arousal/Alertness: Awake/alert ?Behavior During Therapy: [facility] for tasks assessed/performed ?Overall Cognitive Status:  Within Functional Limits for tasks assessed ?  ?  ?  ?  ?  ?  ?  ?  ?  ?  ?  ?  ?  ?  ?  ?  ?  ?  ?  ? ?  ?General Comments   ? ?  ?Exercises    ? ?Assessment/Plan  ?  ?PT Assessment Patient does not need any further PT services  ?PT Problem List Decreased mobility;Decreased activity tolerance;Pain ? ?   ?  ?PT Treatment Interventions     ? ?PT Goals (Current goals can be found in the Care Plan section)  ?Acute  Rehab PT Goals ?Patient Stated Goal: to be able to swim ?PT Goal Formulation: All assessment and education complete, DC therapy ? ?  ?Frequency   ?  ? ? ?Co-evaluation   ?  ?  ?  ?  ? ? ?  ?AM-PAC PT "6 Clicks" Mobility  ?Outcome Measure Help needed turning from your back to your side while in a flat bed without using bedrails?: None ?Help needed moving from lying on your back to sitting on the side of a flat bed without using bedrails?: None ?Help needed moving to and from a bed to a chair (including a wheelchair)?: None ?  ?Help needed to walk in hospital room?: None ?Help needed climbing 3-5 steps with a railing? : None ?6 Click Score: 20 ? ?  ?End of Session   ?Activity Tolerance: Patient tolerated treatment well;No increased pain ?Patient left: in bed;with bed alarm set ?Nurse Communication: Mobility status ?PT Visit Diagnosis: Pain ?  ? ?Time: 5400-8676 ?PT Time Calculation (min) (ACUTE ONLY): 14 min ? ? ?Charges:   PT Evaluation ?$PT Eval Low Complexity: 1 Low ?  ?  ?   ? ? ?Blondell Reveal Kistler PT 04/21/2022  ?Acute Rehabilitation Services ?Pager [phone] ?Office [phone] ? ? ?

## 2022-04-21 NOTE — Consult Note (Signed)
[facility] Nurse ostomy consult note ? ?[facility] Nurse requested for preoperative stoma site marking By Dr. Deland Pretty ? ?Discussed surgical procedure and stoma creation with patient.  Explained role of the [facility] nurse team.  ? ?Examined patient lying and sitting in order to place the marking in the patient's visual field, away from any creases or abdominal contour issues and within the rectus muscle.  There is a deep crease at the right middle quadrant (at umbilicus).  ? ?Marked for colostomy in the LUQ  7cm to the left of the umbilicus and 1cm above the umbilicus. ? ?Marked for ileostomy in the RUQ  6cm to the right of the umbilicus and  8.3GP above the umbilicus. ? ?Patient's abdomen cleansed with CHG wipes at site markings, allowed to air dry prior to marking. Covered marks with thin film transparent dressing to preserve marks until procedure, which is scheduled for later this morning.  ? ?[facility] Nurse team will follow up with patient after surgery for continue ostomy care and teaching should an ostomy be created intraoperatively.  ? ?Thank you for allowing [place] to meet and mark this patient prior to her surgery.  ? ?Maudie Flakes, MSN, RN, [place], [place], CWON-AP, [place]  ?Pager# [phone]  ?

## 2022-04-21 NOTE — Anesthesia Postprocedure Evaluation (Signed)
Anesthesia Post Note ? ?Patient: [name] ? ?Procedure(s) Performed: XI ROBOTIC ASSISTED LOWER ANTERIOR RESECTION WITH COLOSTOMY, BILATERAL TAP BLOCK, ASSESSMENT OF TISSUE PERFUSSION WITH FIREFLY INJECTION ? ?  ? ?Patient location during evaluation: PACU ?Anesthesia Type: General ?Level of consciousness: awake and alert ?Pain management: pain level controlled ?Vital Signs Assessment: post-procedure vital signs reviewed and stable ?Respiratory status: spontaneous breathing, nonlabored ventilation, respiratory function stable and patient connected to nasal cannula oxygen ?Cardiovascular status: blood pressure returned to baseline and stable ?Postop Assessment: no apparent nausea or vomiting ?Anesthetic complications: no ? ? ?No notable events documented. ? ?Last Vitals:  ?Vitals:  ? 04/21/22 1300 04/21/22 1459  ?BP: 114/78 112/74  ?Pulse: 81 87  ?Resp: 13 18  ?Temp: 37 ?C 37 ?C  ?SpO2: 97% 93%  ?  ?Last Pain:  ?Vitals:  ? 04/21/22 1528  ?TempSrc:   ?PainSc: 4   ? ? ?  ?  ?  ?  ?  ?  ? ?[place] ? ? ? ? ?

## 2022-04-21 NOTE — Transfer of Care (Signed)
Immediate Anesthesia Transfer of Care Note ? ?Patient: April Luna ? ?Procedure(s) Performed: XI ROBOTIC ASSISTED LOWER ANTERIOR RESECTION WITH COLOSTOMY, BILATERAL TAP BLOCK, ASSESSMENT OF TISSUE PERFUSSION WITH FIREFLY INJECTION ? ?Patient Location: PACU ? ?Anesthesia Type:General ? ?Level of Consciousness: sedated ? ?Airway & Oxygen Therapy: Patient Spontanous Breathing and Patient connected to face mask oxygen ? ?Post-op Assessment: Report given to RN and Post -op Vital signs reviewed and stable ? ?Post vital signs: Reviewed and stable ? ?Last Vitals:  ?Vitals Value Taken Time  ?BP 128/76 04/21/22 1104  ?Temp    ?Pulse 92 04/21/22 1106  ?Resp 22 04/21/22 1106  ?SpO2 98 % 04/21/22 1106  ?Vitals shown include unvalidated device data. ? ?Last Pain:  ?Vitals:  ? 04/21/22 0652  ?TempSrc:   ?PainSc: 0-No pain  ?   ? ?  ? ?Complications: No notable events documented. ?

## 2022-04-21 NOTE — Op Note (Addendum)
PATIENT: April Luna  51 y.o. female ? ?Patient Care Team: ?Steele Sizer, MD as PCP - General (Family Medicine) ?Sharmaine Base, MD as Referring Physician (Psychiatry) ?Bjorn Loser, MD as Consulting Physician (Urology) ?Germaine Pomfret, [facility] (Pharmacist) ?[place], [place], [place] as Education officer, museum ?Clent Jacks, RN as Oncology Nurse Navigator ? ?PREOP DIAGNOSIS: rectal cancer ? ?POSTOP DIAGNOSIS: rectal cancer ? ?PROCEDURE:  ?Robotic assisted ultralow anterior resection ?End colostomy ?Intraoperative assessment of perfusion using ICG fluorescence ?Bilateral transversus abdominis plane (TAP) blocks ? ?SURGEON: Sharon Mt. Kensi Karr, MD ? ?ASSISTANT: Leighton Ruff, MD ? ?ANESTHESIA: General endotracheal ? ?EBL: 50 mL ?Total I/O ?In: 5625 [I.V.:1400; IV Piggyback:250] ?Out: 650 [Urine:600; Blood:50] ? ?DRAINS: None ? ?SPECIMEN:  ?1.  Distal sigmoid colon and rectum ?2.  Additional sigmoid colon-open end proximal ? ?COUNTS: Sponge, needle and instrument counts were reported correct x2 ? ?FINDINGS: Mass palpable just above the sphincter complex on examination.  This is approximately 2 to 3 cm above the dentate line consistent with a distal rectum.  No obvious metastatic disease on visceral parietal peritoneum or liver.  We divided the rectum essentially just above the level of the dentate line.  Specimen was opened on the back table and grossly, appears to be have at least a centimeter distal margin which at this level in the pelvis is oncologically appropriate.  Any more distally, would likely require an APR/perineal proctectomy. ? ? ?NARRATIVE: ?Informed consent was verified. The patient was taken to the operating room, placed supine on the operating table and SCD's were applied. General endotracheal anesthesia was induced without difficulty. She was then positioned in the lithotomy position with Allen stirrups.  Pressure points were evaluated and padded.  A foley catheter was then placed by  nursing under sterile conditions. Hair on the abdomen was clipped.  She was secured to the operating table. The abdomen was then prepped and draped in the standard sterile fashion. Surgical timeout was called indicating the correct patient, procedure, positioning and need for preoperative antibiotics. ?  ?An OG tube was placed by anesthesia and confirmed to be to suction.  At Palmer's point, a stab incision was created and the Veress needle was introduced into the peritoneal cavity on the first attempt.  Intraperitoneal location was confirmed by the aspiration and saline drop test.  Pneumoperitoneum was established to a maximum pressure of 15 mmHg using CO2.  Following this, the abdomen was marked for planned trocar sites.  Just to the right and cephalad to the umbilicus, an 8 mm incision was created and an 8 mm blunt tipped robotic trocar was cautiously placed into the peritoneal cavity.  The laparoscope was inserted. A ~2 mm capsular tear was noted on the anterior surface of the liver from the Veress needle which is already hemostatic. The abdomen is surveyed and there is no evidence of trocar site complications.  The Veress needle was removed.  Bilateral transversus abdominis plane blocks were then created using a dilute mixture of Exparel with Marcaine.  3 additional 8 mm robotic trochars were placed under direct visualization roughly in a line extending from the right ASIS towards the left upper quadrant. The bladder was inspected and noted to be at/below the pubic symphysis.  Staying 3 fingerbreadths above the pubic symphysis, an incision was created and the 12 mm robotic trocar inserted directed cephalad into the peritoneal cavity under direct visualization.  An additional 5 mm assist port was placed in the right lateral abdomen under direct visualization.  The abdomen  was surveyed.  The left lobe of the liver is elevated anteriorly and the Veress needle puncture site did not extend through the other side of  the liver.  The underlying lesser sac and lesser curvature of the stomach is normal without any apparent injury. She was positioned in Trendelenburg with the left side tilted slightly up.  Small bowel was carefully retracted out of the pelvis.  The robot was then docked and I went to the console. ?  ?The sigmoid colon was readily identified.  Attachments of the sigmoid colon were taken down from the intersigmoid fossa.  The rectosigmoid colon was grasped and elevated anteriorly.  Beginning with a medial to lateral approach, the peritoneum overlying the presacral space was carefully incised.  The TME plane was readily gained working in a plane between the fascia propria of the rectum and the presacral fascia.  Hypogastric nerves were seen going along the the presacral fascia and were protected free of injury.  Working more proximally, the mesorectum and sigmoid mesentery were carefully mobilized off of the peritoneum.  The left ureter was identified and protected free of injury.  The left gonadal vessels were identified and protected.  These were both swept "down."  The superior hemorrhoidal and IMA pedicles were identified. Further mesocolon was mobilized proximally staying in this plane between the retroperitoneum proper and the mesocolon. Attention was then turned to the lateral portion of dissection.  The sigmoid colon was then retracted to the right.  The sigmoid colon was fully mobilized. The descending colon was mobilized by incising the Davie Sagona line of Toldt.  This was mobilized to the degree necessary to facilitate reach of her distal sigmoid colon to her planned colostomy site. ? ?The associated mesocolon was also mobilized medially.  The left ureter again was confirmed to be well away from the vasculature which had been dissected medially.  The rectosigmoid colon was elevated anteriorly. The left ureter was re-identified. The IMA was clear of this. The IMA was then divided with the vessel sealer. The stump  was inspected and noted to be completely hemostatic with a good seal.  The mesentery was divided out to the point of planned proximal division. ? ?We then turned our attention to the proctectomy portion of the procedure.  We began our dissection in the pelvis in the posterior location first.  Working within the TME plane between the fascia propria the rectum and the presacral fascia, the mesorectum was fully mobilized down to the level of the pelvic floor musculature and even working slightly more distal to this to ensure an appropriate distal margin.  This dissection was then approached laterally continuing this plan dissection around the rectum.  The dissection was completed anteriorly.  The uterus and vagina were retracted anteriorly.  The peritoneum in the pouch of Nathaneil Canary was incised.  We then continued our mesorectal dissection anteriorly as the final portion of the dissection.  We were well below the location where the mesorectum sends.  I then went below and performed a rectal exam.  I was able to palpate the mass and see relative to the dissection intra-abdominally we are well below the mass. ? ?A 60 mm green load robotic stapler was then placed through the 12 mm port and introduced into the peritoneal cavity.  The rectum was divided with 2 firings of the stapler.  The stump was intact and healthy in appearance. ?  ?Attention was turned to performing a perfusion test. ICG was administered by anesthesia and at the  level of the cleared mesentery proximally, there was excellent uptake of the tracer.  There was a visible pulse in the mesentery out to the level of the cleared colon at the level of the proximal sigmoid/descending colon junction.  This colon is also supple and healthy in appearance without any thickening.  This reached to the planned colostomy site without any difficulty and remained in that location without any tension. A locking grasper was then placed on the sigmoid staple line. ?  ?Attention  was turned to the extracorporeal portion of the procedure.  The robot was undocked.  I scrubbed back in.  Using the 12 mm trocar site, a Pfannenstiel incision was created and incorporated the fascial openin

## 2022-04-22 ENCOUNTER — Encounter (HOSPITAL_COMMUNITY): Payer: Self-pay | Admitting: Surgery

## 2022-04-22 LAB — GLUCOSE, CAPILLARY
Glucose-Capillary: 104 mg/dL — ABNORMAL HIGH (ref 70–99)
Glucose-Capillary: 110 mg/dL — ABNORMAL HIGH (ref 70–99)
Glucose-Capillary: 246 mg/dL — ABNORMAL HIGH (ref 70–99)
Glucose-Capillary: 162 mg/dL — ABNORMAL HIGH (ref 70–99)
Glucose-Capillary: 142 mg/dL — ABNORMAL HIGH (ref 70–99)

## 2022-04-22 LAB — CBC
HCT: 40 % (ref 36.0–46.0)
Hemoglobin: 13.5 g/dL (ref 12.0–15.0)
MCH: 32.6 pg (ref 26.0–34.0)
MCHC: 33.8 g/dL (ref 30.0–36.0)
MCV: 96.6 fL (ref 80.0–100.0)
Platelets: 151 10*3/uL (ref 150–400)
RBC: 4.14 MIL/uL (ref 3.87–5.11)
RDW: 13.2 % (ref 11.5–15.5)
WBC: 13.1 10*3/uL — ABNORMAL HIGH (ref 4.0–10.5)
nRBC: 0 % (ref 0.0–0.2)

## 2022-04-22 LAB — BASIC METABOLIC PANEL
Anion gap: 7 (ref 5–15)
BUN: 6 mg/dL (ref 6–20)
CO2: 27 mmol/L (ref 22–32)
Calcium: 9.2 mg/dL (ref 8.9–10.3)
Chloride: 109 mmol/L (ref 98–111)
Creatinine, Ser: 0.87 mg/dL (ref 0.44–1.00)
GFR, Estimated: >60 mL/min (ref >60)
Glucose, Bld: 115 mg/dL — ABNORMAL HIGH (ref 70–99)
Potassium: 3.9 mmol/L (ref 3.5–5.1)
Sodium: 143 mmol/L (ref 135–145)

## 2022-04-22 MED ORDER — NICOTINE 21 MG/24HR TD PT24
21.0000 mg | MEDICATED_PATCH | Freq: Every day | TRANSDERMAL | Status: DC
  Administered 2022-04-22 – 2022-04-24 (×3): 21 mg via TRANSDERMAL
  Filled 2022-04-22 (×3): qty 1

## 2022-04-22 MED ORDER — CHLORHEXIDINE GLUCONATE CLOTH 2 % EX PADS
6.0000 | MEDICATED_PAD | Freq: Every day | CUTANEOUS | Status: DC
  Administered 2022-04-22 – 2022-04-24 (×3): 6 via TOPICAL

## 2022-04-22 NOTE — Evaluation (Signed)
Occupational Therapy Evaluation ?Patient Details ?Name: April Luna ?MRN: [id number] ?DOB: 03-25-1971 ?Today's Date: 04/22/2022 ? ? ?History of Present Illness 51 y.o. female with history of HTN, HLD, DM, hypothyroidism, GERD, seasonal Allergies, tobacco use, rectal cancer admitted for end colostomy 04/21/22.  ? ?Clinical Impression ?  ?Patient evaluated by Occupational Therapy with no further acute OT needs identified. All education has been completed and the patient has no further questions. Patient is MI with Adls with education provided to reduce bending/pressure on incisions. Patient verbalized and demonstrated understanding.  See below for any follow-up Occupational Therapy or equipment needs. OT is signing off. Thank you for this referral.   ?   ? ?Recommendations for follow up therapy are one component of a multi-disciplinary discharge planning process, led by the attending physician.  Recommendations may be updated based on patient status, additional functional criteria and insurance authorization.  ? ?Follow Up Recommendations ? No OT follow up  ?  ?Assistance Recommended at Discharge Intermittent Supervision/Assistance  ?Patient can return home with the following Assistance with cooking/housework;Direct supervision/assist for medications management;Assist for transportation;Help with stairs or ramp for entrance ? ?  ?Functional Status Assessment ? Patient has not had a recent decline in their functional status  ?Equipment Recommendations ? Other (comment) (reacher)  ?  ?Recommendations for Other Services   ? ? ?  ?Precautions / Restrictions Precautions ?Precautions: Other (comment) ?Precaution Comments: abdominal surgery ?Restrictions ?Weight Bearing Restrictions: No  ? ?  ? ?Mobility Bed Mobility ?Overal bed mobility: Independent ?  ?  ?  ?  ?  ?  ?  ?  ? ?Transfers ?  ?  ?  ?  ?  ?  ?  ?  ?  ?  ?  ? ?  ?Balance Overall balance assessment: Independent ?  ?  ?  ?  ?  ?  ?  ?  ?  ?  ?  ?  ?  ?  ?  ?  ?   ?  ?   ? ?ADL either performed or assessed with clinical judgement  ? ?ADL Overall ADL's : Modified independent ?  ?  ?  ?  ?  ?  ?  ?  ?  ?  ?  ?  ?  ?  ?  ?  ?  ?  ?  ?General ADL Comments: patient is able to complete LB dressing tasks, standing balance, item retrevial from the floor with MI. patient was educated on reacher use and benefits of having one at home. patient verbalized understanding. patient was was educated on importance of getting out of bed while in hospital with nursing supervision. patient verbalized understanding.  ? ? ? ?Vision Patient Visual Report: No change from baseline ?   ?   ?Perception   ?  ?Praxis   ?  ? ?Pertinent Vitals/Pain Pain Assessment ?Pain Assessment: Faces ?Faces Pain Scale: Hurts a little bit ?Pain Location: abdomen with movement ?Pain Descriptors / Indicators: Discomfort ?Pain Intervention(s): Limited activity within patient's tolerance, Monitored during session, Repositioned  ? ? ? ?Hand Dominance Right ?  ?Extremity/Trunk Assessment Upper Extremity Assessment ?Upper Extremity Assessment: Overall WFL for tasks assessed ?  ?Lower Extremity Assessment ?Lower Extremity Assessment: Defer to PT evaluation ?  ?Cervical / Trunk Assessment ?Cervical / Trunk Assessment: Normal ?  ?Communication Communication ?Communication: No difficulties ?  ?Cognition Arousal/Alertness: Awake/alert ?Behavior During Therapy: [facility] for tasks assessed/performed ?Overall Cognitive Status: Within Functional Limits for tasks assessed ?  ?  ?  ?  ?  ?  ?  ?  ?  ?  ?  ?  ?  ?  ?  ?  ?  General Comments: noted to have some moments of poor safety awareness ?  ?  ?General Comments    ? ?  ?Exercises   ?  ?Shoulder Instructions    ? ? ?Home Living Family/patient expects to be discharged to:: Private residence ?Living Arrangements: Spouse/significant other;Children ?Available Help at Discharge: Family;Available PRN/intermittently ?Type of Home: Apartment ?Home Access: Stairs to enter ?Entrance Stairs-Number of  Steps: flight ?Entrance Stairs-Rails: Right ?Home Layout: One level ?  ?  ?Bathroom Shower/Tub: Tub/shower unit ?  ?  ?  ?  ?Home Equipment: None ?  ?  ?  ? ?  ?Prior Functioning/Environment Prior Level of Function : Independent/Modified Independent ?  ?  ?  ?  ?  ?  ?  ?  ?  ? ?  ?  ?OT Problem List:   ?  ?   ?OT Treatment/Interventions:    ?  ?OT Goals(Current goals can be found in the care plan section) Acute Rehab OT Goals ?OT Goal Formulation: All assessment and education complete, DC therapy  ?OT Frequency:   ?  ? ?Co-evaluation   ?  ?  ?  ?  ? ?  ?AM-PAC OT "6 Clicks" Daily Activity     ?Outcome Measure Help from another person eating meals?: None ?Help from another person taking care of personal grooming?: None ?Help from another person toileting, which includes using toliet, bedpan, or urinal?: None ?Help from another person bathing (including washing, rinsing, drying)?: None ?Help from another person to put on and taking off regular upper body clothing?: None ?Help from another person to put on and taking off regular lower body clothing?: None ?6 Click Score: 24 ?  ?End of Session Nurse Communication: Other (comment) (IV beeping) ? ?Activity Tolerance: Patient tolerated treatment well ?Patient left: in bed;with call bell/phone within reach ? ?OT Visit Diagnosis: Unsteadiness on feet (R26.81)  ?              ?Time: 5597-4163 ?OT Time Calculation (min): 17 min ?Charges:  OT General Charges ?$OT Visit: 1 Visit ?OT Evaluation ?$OT Eval Low Complexity: 1 Low ? ?Alfonzo Arca OTR/L, MS ?Acute Rehabilitation Department ?Office# [phone] ?Pager# [phone] ? ? ?April Luna ?04/22/2022, 3:11 PM ?

## 2022-04-22 NOTE — Progress Notes (Signed)
1 Day Post-Op  ? ?Subjective/Chief Complaint: ?DOING WELL ? Ostomy output noted  ? ? ?Objective: ?Vital signs in last 24 hours: ?Temp:  [98.2 ?F (36.8 ?C)-99 ?F (37.2 ?C)] 99 ?F (37.2 ?C) (04/29 2440) ?Pulse Rate:  [78-94] 86 (04/29 0905) ?Resp:  [10-20] 18 (04/29 0905) ?BP: (90-128)/(63-85) 99/78 (04/29 1027) ?SpO2:  [85 %-99 %] 85 % (04/29 0905) ?Weight:  [70.8 kg] 70.8 kg (04/29 0500) ?Last BM Date : 04/21/22 ? ?Intake/Output from previous day: ?04/28 0701 - 04/29 0700 ?In: 2926 [P.O.:970; I.V.:1706; IV Piggyback:250] ?Out: 4250 [Urine:3800; Stool:400; Blood:50] ?Intake/Output this shift: ?No intake/output data recorded. ? ?General appearance: alert and cooperative ?Resp: clear to auscultation bilaterally ?Cardio: NSR  ?Incision/Wound:ostomy pink viable port sites CDI  ? ?Lab Results:  ?Recent Labs  ?  04/21/22 ?2536 04/22/22 ?6440  ?WBC 13.7* 13.1*  ?HGB 15.0 13.5  ?HCT 42.7 40.0  ?PLT 172 151  ? ?BMET ?Recent Labs  ?  04/21/22 ?0632 04/22/22 ?0355  ?NA 136 143  ?K 3.6 3.9  ?CL 106 109  ?CO2 23 27  ?GLUCOSE 163* 115*  ?BUN 6 6  ?CREATININE 0.94 0.87  ?CALCIUM 9.0 9.2  ? ?PT/INR ?No results for input(s): LABPROT, INR in the last 72 hours. ?ABG ?No results for input(s): PHART, HCO3 in the last 72 hours. ? ?Invalid input(s): PCO2, PO2 ? ?Studies/Results: ?No results found. ? ?Anti-infectives: ?Anti-infectives (From admission, onward)  ? ? Start     Dose/Rate Route Frequency Ordered Stop  ? 04/21/22 1400  neomycin (MYCIFRADIN) tablet 1,000 mg  Status:  Discontinued       ?See Hyperspace for full Linked Orders Report.  ? 1,000 mg Oral 3 times per day 04/21/22 0630 04/21/22 1158  ? 04/21/22 1400  metroNIDAZOLE (FLAGYL) tablet 1,000 mg  Status:  Discontinued       ?See Hyperspace for full Linked Orders Report.  ? 1,000 mg Oral 3 times per day 04/21/22 0630 04/21/22 1158  ? 04/21/22 0645  cefoTEtan (CEFOTAN) 2 g in sodium chloride 0.9 % 100 mL IVPB       ? 2 g ?200 mL/hr over 30 Minutes Intravenous On call to O.R.  04/21/22 0630 04/21/22 0740  ? 04/21/22 0639  sodium chloride 0.9 % with cefoTEtan (CEFOTAN) ADS Med       ?Note to Pharmacy: Rocky Morel D: cabinet override  ?    04/21/22 3474 04/21/22 0806  ? ?  ? ? ?Assessment/Plan: ?s/p Procedure(s): ?XI ROBOTIC ASSISTED LOWER ANTERIOR RESECTION WITH COLOSTOMY, BILATERAL TAP BLOCK, ASSESSMENT OF TISSUE PERFUSSION WITH FIREFLY INJECTION (N/A) ? ? ?Looks well ?Advance diet  ?Ambulate  ? LOS: 1 day  ? ? ?Turner Daniels MD  ?04/22/2022 ? ?

## 2022-04-22 NOTE — Progress Notes (Signed)
Received order via phone from Dr Brantley Stage to increase nicotine patch to 21 mg. Md aware pt requested and only had 7 mg on presently.  ?

## 2022-04-23 DIAGNOSIS — E1129 Type 2 diabetes mellitus with other diabetic kidney complication: Secondary | ICD-10-CM

## 2022-04-23 DIAGNOSIS — R809 Proteinuria, unspecified: Secondary | ICD-10-CM

## 2022-04-23 DIAGNOSIS — C20 Malignant neoplasm of rectum: Secondary | ICD-10-CM

## 2022-04-23 LAB — CBC
HCT: 41.4 % (ref 36.0–46.0)
Hemoglobin: 13.8 g/dL (ref 12.0–15.0)
MCH: 32 pg (ref 26.0–34.0)
MCHC: 33.3 g/dL (ref 30.0–36.0)
MCV: 96.1 fL (ref 80.0–100.0)
Platelets: 138 10*3/uL — ABNORMAL LOW (ref 150–400)
RBC: 4.31 MIL/uL (ref 3.87–5.11)
RDW: 13.2 % (ref 11.5–15.5)
WBC: 11 10*3/uL — ABNORMAL HIGH (ref 4.0–10.5)
nRBC: 0 % (ref 0.0–0.2)

## 2022-04-23 LAB — BASIC METABOLIC PANEL
Anion gap: 8 (ref 5–15)
BUN: 8 mg/dL (ref 6–20)
CO2: 29 mmol/L (ref 22–32)
Calcium: 9 mg/dL (ref 8.9–10.3)
Chloride: 107 mmol/L (ref 98–111)
Creatinine, Ser: 0.76 mg/dL (ref 0.44–1.00)
GFR, Estimated: >60 mL/min (ref >60)
Glucose, Bld: 135 mg/dL — ABNORMAL HIGH (ref 70–99)
Potassium: 3.8 mmol/L (ref 3.5–5.1)
Sodium: 144 mmol/L (ref 135–145)

## 2022-04-23 LAB — GLUCOSE, CAPILLARY
Glucose-Capillary: 147 mg/dL — ABNORMAL HIGH (ref 70–99)
Glucose-Capillary: 156 mg/dL — ABNORMAL HIGH (ref 70–99)
Glucose-Capillary: 157 mg/dL — ABNORMAL HIGH (ref 70–99)
Glucose-Capillary: 153 mg/dL — ABNORMAL HIGH (ref 70–99)

## 2022-04-23 NOTE — Progress Notes (Signed)
Pt refusing CPAP QHS at this time.  

## 2022-04-23 NOTE — Plan of Care (Signed)

## 2022-04-23 NOTE — Progress Notes (Signed)
2 Days Post-Op  ? ?Subjective/Chief Complaint: ?Pt tolerating diet and has ostomy output  ? ? ?Objective: ?Vital signs in last 24 hours: ?Temp:  [98.7 ?F (37.1 ?C)-99.2 ?F (37.3 ?C)] 99.2 ?F (37.3 ?C) (04/30 0537) ?Pulse Rate:  [85-98] 98 (04/30 0537) ?Resp:  [16-20] 20 (04/30 0537) ?BP: (130-147)/(89-99) 138/99 (04/30 0537) ?SpO2:  [87 %-97 %] 91 % (04/30 0537) ?Last BM Date : 04/21/22 ? ?Intake/Output from previous day: ?04/29 0701 - 04/30 0700 ?In: 3007.3 [P.O.:600; I.V.:2407.3] ?Out: 6578 [Urine:4900; IONGE:952] ?Intake/Output this shift: ?No intake/output data recorded. ? ?General appearance: alert and cooperative ?Resp: clear to auscultation bilaterally ?Cardio: NSR ?Incision/Wound:port sites clear ostomy viable functioning  ? ?Lab Results:  ?Recent Labs  ?  04/22/22 ?8413 04/23/22 ?2440  ?WBC 13.1* 11.0*  ?HGB 13.5 13.8  ?HCT 40.0 41.4  ?PLT 151 138*  ? ?BMET ?Recent Labs  ?  04/22/22 ?0355 04/23/22 ?0358  ?NA 143 144  ?K 3.9 3.8  ?CL 109 107  ?CO2 27 29  ?GLUCOSE 115* 135*  ?BUN 6 8  ?CREATININE 0.87 0.76  ?CALCIUM 9.2 9.0  ? ?PT/INR ?No results for input(s): LABPROT, INR in the last 72 hours. ?ABG ?No results for input(s): PHART, HCO3 in the last 72 hours. ? ?Invalid input(s): PCO2, PO2 ? ?Studies/Results: ?No results found. ? ?Anti-infectives: ?Anti-infectives (From admission, onward)  ? ? Start     Dose/Rate Route Frequency Ordered Stop  ? 04/21/22 1400  neomycin (MYCIFRADIN) tablet 1,000 mg  Status:  Discontinued       ?See Hyperspace for full Linked Orders Report.  ? 1,000 mg Oral 3 times per day 04/21/22 0630 04/21/22 1158  ? 04/21/22 1400  metroNIDAZOLE (FLAGYL) tablet 1,000 mg  Status:  Discontinued       ?See Hyperspace for full Linked Orders Report.  ? 1,000 mg Oral 3 times per day 04/21/22 0630 04/21/22 1158  ? 04/21/22 0645  cefoTEtan (CEFOTAN) 2 g in sodium chloride 0.9 % 100 mL IVPB       ? 2 g ?200 mL/hr over 30 Minutes Intravenous On call to O.R. 04/21/22 0630 04/21/22 0740  ? 04/21/22 0639   sodium chloride 0.9 % with cefoTEtan (CEFOTAN) ADS Med       ?Note to Pharmacy: Rocky Morel D: cabinet override  ?    04/21/22 1027 04/21/22 0806  ? ?  ? ? ?Assessment/Plan: ?s/p Procedure(s): ?XI ROBOTIC ASSISTED LOWER ANTERIOR RESECTION WITH COLOSTOMY, BILATERAL TAP BLOCK, ASSESSMENT OF TISSUE PERFUSSION WITH FIREFLY INJECTION (N/A) ?Has concerns and wants more ostomy teaching today  ?We will ask wound ostomy care nurse to see per instruction today and plan for discharge tomorrow ? ? LOS: 2 days  ? ? ?Turner Daniels MD ?04/23/2022 ? ?

## 2022-04-24 ENCOUNTER — Ambulatory Visit: Payer: Medicare Other | Admitting: Gastroenterology

## 2022-04-24 ENCOUNTER — Other Ambulatory Visit: Payer: Self-pay

## 2022-04-24 LAB — GLUCOSE, CAPILLARY: Glucose-Capillary: 133 mg/dL — ABNORMAL HIGH (ref 70–99)

## 2022-04-24 NOTE — Care Management Important Message (Signed)
Important Message ? ?Patient Details  ?Name: April Luna ?MRN: [id number] ?Date of Birth: [birth year] ? ? ?Medicare Important Message Given:  Yes ? ? ? ? ?Memory Argue ?04/24/2022, 4:17 PM ?COPY OF IM MAILED TO PT'S ?

## 2022-04-24 NOTE — TOC Transition Note (Signed)
Transition of Care (TOC) - CM/SW Discharge Note ? ? ?Patient Details  ?Name: April Luna ?MRN: [id number] ?Date of Birth: [birth year] ? ?Transition of Care (TOC) CM/SW Contact:  ?Asaph Serena, LCSW ?Phone Number: ?04/24/2022, 10:33 AM ? ? ?Clinical Narrative:    ?Met with pt and confirming agreement with plan for [facility] and no agency preference.  Referral placed with [facility]. No further TOC needs. ? ? ?Final next level of care: [place] ?Barriers to Discharge: No Barriers Identified ? ? ?Patient Goals and CMS Choice ?Patient states their goals for this hospitalization and ongoing recovery are:: return home ?  ?  ? ?Discharge Placement ?  ?           ?  ?  ?  ?  ? ?Discharge Plan and Services ?  ?  ?           ?DME Arranged: N/A ?DME Agency: NA ?  ?  ?  ?HH Arranged: RN ?[place] Agency: [place] ?Date HH Agency Contacted: 04/24/22 ?[place]: [id number] ?Representative spoke with at [place]: Tommi Rumps ? ?Social Determinants of Health (SDOH) Interventions ?  ? ? ?Readmission Risk Interventions ? ?  04/24/2022  ? 10:32 AM  ?Readmission Risk Prevention Plan  ?Transportation Screening Complete  ?PCP or Specialist Appt within 5-7 Days Complete  ?Home Care Screening Complete  ?Medication Review (RN CM) Complete  ? ? ? ? ? ?

## 2022-04-24 NOTE — Progress Notes (Signed)
?Subjective ?No acute events. Doing great. Denies any complaints or issues at present. Completed stoma teaching today and states she feels comfortable emptying, changing and handling potential issues. Denies n/v.Tolerating diet. Denies abdominal pain. Requesting discharge. ? ?Objective: ?Vital signs in last 24 hours: ?Temp:  [98 ?F (36.7 ?C)-99.3 ?F (37.4 ?C)] 99.3 ?F (37.4 ?C) (05/01 3810) ?Pulse Rate:  [94-109] 109 (05/01 0618) ?Resp:  [16-18] 18 (05/01 0618) ?BP: (138-150)/(90-102) 138/90 (05/01 0618) ?SpO2:  [89 %-96 %] 92 % (05/01 0730) ?Last BM Date : 04/23/22 (colostomy) ? ?Intake/Output from previous day: ?04/30 0701 - 05/01 0700 ?In: 2164.3 [P.O.:960; I.V.:1204.3] ?Out: 800 [Urine:300; Stool:500] ?Intake/Output this shift: ?No intake/output data recorded. ? ?Gen: NAD, comfortable ?CV: RRR ?Pulm: Normal work of breathing ?Abd: Soft, NT/ND. Ostomy pink, productive. ?Ext: SCDs in place ? ?Lab Results: ?CBC  ?Recent Labs  ?  04/22/22 ?0355 04/23/22 ?0358  ?WBC 13.1* 11.0*  ?HGB 13.5 13.8  ?HCT 40.0 41.4  ?PLT 151 138*  ? ?BMET ?Recent Labs  ?  04/22/22 ?0355 04/23/22 ?0358  ?NA 143 144  ?K 3.9 3.8  ?CL 109 107  ?CO2 27 29  ?GLUCOSE 115* 135*  ?BUN 6 8  ?CREATININE 0.87 0.76  ?CALCIUM 9.2 9.0  ? ?PT/INR ?No results for input(s): LABPROT, INR in the last 72 hours. ?ABG ?No results for input(s): PHART, HCO3 in the last 72 hours. ? ?Invalid input(s): PCO2, PO2 ? ?Studies/Results: ? ?Anti-infectives: ?Anti-infectives (From admission, onward)  ? ? Start     Dose/Rate Route Frequency Ordered Stop  ? 04/21/22 1400  neomycin (MYCIFRADIN) tablet 1,000 mg  Status:  Discontinued       ?See Hyperspace for full Linked Orders Report.  ? 1,000 mg Oral 3 times per day 04/21/22 0630 04/21/22 1158  ? 04/21/22 1400  metroNIDAZOLE (FLAGYL) tablet 1,000 mg  Status:  Discontinued       ?See Hyperspace for full Linked Orders Report.  ? 1,000 mg Oral 3 times per day 04/21/22 0630 04/21/22 1158  ? 04/21/22 0645  cefoTEtan (CEFOTAN) 2 g  in sodium chloride 0.9 % 100 mL IVPB       ? 2 g ?200 mL/hr over 30 Minutes Intravenous On call to O.R. 04/21/22 0630 04/21/22 0740  ? 04/21/22 0639  sodium chloride 0.9 % with cefoTEtan (CEFOTAN) ADS Med       ?Note to Pharmacy: Rocky Morel D: cabinet override  ?    04/21/22 1751 04/21/22 0806  ? ?  ? ? ? ?Assessment/Plan: ?Patient Active Problem List  ? Diagnosis Date Noted  ? Rectal cancer ([place]) 04/21/2022  ? Genetic testing 03/27/2022  ? Family history of colon cancer 03/14/2022  ? Family history of uterine cancer 03/14/2022  ? Family history of breast cancer 03/14/2022  ? Screening for colon cancer   ? Rectal mass   ? Polyp of ascending colon   ? Sleep apnea 08/11/2021  ? Plantar callus 10/06/2019  ? Acquired hallux limitus of both feet 10/06/2019  ? Type 2 diabetes mellitus with microalbuminuria, without long-term current use of insulin ([place]) 06/24/2019  ? Chronic obstructive pulmonary disease ([place]) 06/24/2019  ? Chronic constipation 06/24/2019  ? ASCUS of cervix with negative high risk HPV 09/05/2018  ? GERD without esophagitis 04/11/2018  ? Hyperlipidemia 04/11/2018  ? Hypertension 04/11/2018  ? Hypothyroidism 04/26/2015  ? Bipolar I disorder, most recent episode (or current) manic ([place]) 04/25/2015  ? Delirium due to another medical condition 04/25/2015  ? Cannabis abuse 04/25/2015  ? ?s/p  Procedure(s): ?XI ROBOTIC ASSISTED LOWER ANTERIOR RESECTION WITH COLOSTOMY, BILATERAL TAP BLOCK, ASSESSMENT OF TISSUE PERFUSSION WITH FIREFLY INJECTION 04/21/2022 ? ?-Doing great ?-Reviewed with her the procedure, findings, plans. Pathology pending ?-Goals for discharge met; she is comfortable with and stable for discharge home. We spent time reviewing postoperative expectations, restrictions including 15 lb lifting x 6 weeks, dietary. Discussed anticipated issues regarding new ostomy. We have sent referral to [facility] for outpatient follow-up and additionally requested home health to bridge transition home.  Follow-up in our office arranged ? ? LOS: 3 days  ? ?Nadeen Landau, MD FACS ?[facility] ? ?

## 2022-04-24 NOTE — Consult Note (Signed)
[place] Nurse ostomy follow up ?Patient receiving care in [place]. Spouse present at time of the teaching session. ?Stoma type/location: LUQ colostomy ?Stomal assessment/size: slightly oval, 1 1/4 inches, slightly above skin level. Sutures intact, stoma surface darkened. ?Peristomal assessment: intact ?Treatment options for stomal/peristomal skin: barrier ring ?Output: drops of brown in existing pouch  ?Ostomy pouching: 2pc. Patient uses a 2 1/4 inch two piece pouching system. Kellie Simmering [phone] for the skin barrier; Kellie Simmering #[phone] for the Roselee Culver #[phone] for the barrier ring. ?Education provided: Patient performed the entire pouch change, cleaning, application process with my guidance. ?Enrolled patient in [place] Discharge program: Yes, today. ? ?Val Riles, RN, MSN, CWOCN, CNS-BC, pager [phone]  ?

## 2022-04-24 NOTE — Progress Notes (Signed)
Patient given discharge instructions, and verbalized an understanding of all discharge instructions.  Patient agrees with discharge plan, and is being discharged in stable medical condition.  Patient given transportation via wheelchair. 

## 2022-04-25 ENCOUNTER — Telehealth: Payer: Self-pay

## 2022-04-25 NOTE — Discharge Summary (Signed)
Patient ID: ?April Luna ?MRN: [id number] ?DOB/AGE: 29-Jun-1971 51 y.o. ? ?Admit date: 04/21/2022 ?Discharge date: 04/24/2022 ? ?Discharge Diagnoses ?Patient Active Problem List  ? Diagnosis Date Noted  ? Rectal cancer ([place]) 04/21/2022  ? Genetic testing 03/27/2022  ? Family history of colon cancer 03/14/2022  ? Family history of uterine cancer 03/14/2022  ? Family history of breast cancer 03/14/2022  ? Screening for colon cancer   ? Rectal mass   ? Polyp of ascending colon   ? Sleep apnea 08/11/2021  ? Plantar callus 10/06/2019  ? Acquired hallux limitus of both feet 10/06/2019  ? Type 2 diabetes mellitus with microalbuminuria, without long-term current use of insulin ([place]) 06/24/2019  ? Chronic obstructive pulmonary disease ([place]) 06/24/2019  ? Chronic constipation 06/24/2019  ? ASCUS of cervix with negative high risk HPV 09/05/2018  ? GERD without esophagitis 04/11/2018  ? Hyperlipidemia 04/11/2018  ? Hypertension 04/11/2018  ? Hypothyroidism 04/26/2015  ? Bipolar I disorder, most recent episode (or current) manic ([place]) 04/25/2015  ? Delirium due to another medical condition 04/25/2015  ? Cannabis abuse 04/25/2015  ? ? ?Procedures ?OR 04/21/22: ?Robotic assisted ultralow anterior resection ?End colostomy ?Intraoperative assessment of perfusion using ICG fluorescence ?Bilateral transversus abdominis plane (TAP) blocks ?SPECIMEN:  ?1.  Distal sigmoid colon and rectum ?2.  Additional sigmoid colon-open end proximal ? ?  ?FINDINGS: Mass palpable just above the sphincter complex on examination.  This is approximately 2 to 3 cm above the dentate line consistent with a distal rectum.  No obvious metastatic disease on visceral parietal peritoneum or liver.  We divided the rectum essentially just above the level of the dentate line.  Specimen was opened on the back table and grossly, appears to be have at least a centimeter distal margin which at this level in the pelvis is oncologically appropriate.  Any more distally,  would likely require an APR/perineal proctectomy. ? ?Hospital Course: She was admitted postoperatively and recovered uneventfully.  She remained in house waiting for our wound ostomy team to initiate a teaching session.  This was completed on 5/1 and following this, she was comfortable with managing her colostomy herself.  She was requesting discharge.  She was stable from our perspective for discharge home.  Home health consultation was made.  We have also arranged for outpatient wound ostomy clinic follow-up.  Follow-up my office was made.  Postoperative expectations were reviewed and she was instructed to call with any questions or concerns. ? ? ?Allergies as of 04/24/2022   ? ?   Reactions  ? Metformin And Related Nausea And Vomiting  ? Nsaids Other (See Comments)  ? Stage 3 CKD  ? Perphenazine Other (See Comments)  ? Tremors, muscle weakness, tongue swelling  ? Sulfa Antibiotics Other (See Comments)  ? GI distress  ? Abilify [aripiprazole] Rash  ? Penicillins Rash  ? She has taken amoxicillin without problems  ? ?  ? ?  ?Medication List  ?  ? ?TAKE these medications   ? ?acetaminophen 325 MG tablet ?Commonly known as: TYLENOL ?Take 650 mg by mouth every 6 (six) hours as needed for headache (pain). ?  ?AeroChamber MV inhaler ?Use as instructed ?  ?albuterol 108 (90 Base) MCG/ACT inhaler ?Commonly known as: Ventolin HFA ?Inhale 2 puffs into the lungs every 6 (six) hours as needed for wheezing or shortness of breath. ?  ?amantadine 100 MG capsule ?Commonly known as: SYMMETREL ?Take 100 mg by mouth 2 (two) times daily. ?  ?blood glucose meter  kit and supplies ?Dispense based on patient and insurance preference. Use up to four times daily as directed. (FOR ICD-10 E10.9, E11.9). ?  ?Breztri Aerosphere 160-9-4.8 MCG/ACT Aero ?Generic drug: Budeson-Glycopyrrol-Formoterol ?Inhale 2 puffs into the lungs in the morning and at bedtime. ?  ?buPROPion 150 MG 24 hr tablet ?Commonly known as: WELLBUTRIN XL ?Take 150 mg by mouth  every morning. ?  ?dapagliflozin propanediol 10 MG Tabs tablet ?Commonly known as: [place] ?Take 1 tablet (10 mg total) by mouth daily before breakfast. ?  ?gabapentin 300 MG capsule ?Commonly known as: NEURONTIN ?Take 300 mg by mouth 2 (two) times daily. ?  ?guaiFENesin 600 MG 12 hr tablet ?Commonly known as: Mucinex ?Take 2 tablets (1,200 mg total) by mouth 2 (two) times daily as needed for to loosen phlegm or cough. ?  ?hydrOXYzine 50 MG capsule ?Commonly known as: VISTARIL ?Take 50 mg by mouth 3 (three) times daily as needed for anxiety. ?  ?icosapent Ethyl 1 g capsule ?Commonly known as: VASCEPA ?Take 2 capsules (2 g total) by mouth 2 (two) times daily. ?  ?April Luna 234 MG/1.5ML injection ?Generic drug: paliperidone ?Inject 234 mg into the muscle every 30 (thirty) days. On or about the 14th of each month ?  ?levothyroxine 75 MCG tablet ?Commonly known as: SYNTHROID ?Take 1 tablet (75 mcg total) by mouth daily before breakfast. ?  ?linaclotide 145 MCG Caps capsule ?Commonly known as: Linzess ?Take 1 capsule (145 mcg total) by mouth daily before breakfast. ?  ?loratadine 10 MG tablet ?Commonly known as: CLARITIN ?Take 10 mg by mouth every morning. ?  ?losartan 25 MG tablet ?Commonly known as: COZAAR ?Take 1 tablet (25 mg total) by mouth daily. ?  ?nicotine 7 mg/24hr patch ?Commonly known as: NICODERM CQ - dosed in mg/24 hr ?Place 1 patch (7 mg total) onto the skin daily. ?  ?NON FORMULARY ?Pt uses a cpap nightly ?  ?pantoprazole 40 MG tablet ?Commonly known as: PROTONIX ?Take 1 tablet (40 mg total) by mouth daily. ?What changed: when to take this ?  ?rosuvastatin 40 MG tablet ?Commonly known as: CRESTOR ?Take 1 tablet (40 mg total) by mouth daily. ?What changed: when to take this ?  ?sertraline 100 MG tablet ?Commonly known as: ZOLOFT ?Take 200 mg by mouth every morning. ?  ?traMADol 50 MG tablet ?Commonly known as: Ultram ?Take 1 tablet (50 mg total) by mouth every 6 (six) hours as needed for up to 5 days  (postop pain not controlled with tylenol first). ?  ?Vitamin D-3 125 MCG (5000 UT) Tabs ?Take 5,000 Units by mouth daily. ?  ? ?  ? ? ? ? Follow-up Information   ? ? Ileana Roup, MD. Schedule an appointment as soon as possible for a visit in 3 day(s).   ?Specialties: General Surgery, Colon and Rectal Surgery ?Contact information: ?[address] ?[address] ?[place] [address] ?[phone] ? ? ?  ?  ? ? Care, [facility] Follow up.   ?Specialty: Home Health Services ?Why: to provide home health nursing visits ?Contact information: ?[address] ?[address] ?[place] [address] ?[phone] ? ? ?  ?  ? ?  ?  ? ?  ? ? ?Sharon Mt. Dema Severin, M.D. ?[facility] ? ?

## 2022-04-25 NOTE — Telephone Encounter (Signed)
Message sent to scheduling team to arrange 2 week post operative appointment with Dr. Tasia Catchings. ?

## 2022-04-26 ENCOUNTER — Telehealth: Payer: Self-pay | Admitting: Family Medicine

## 2022-04-26 ENCOUNTER — Other Ambulatory Visit: Payer: Self-pay | Admitting: Gastroenterology

## 2022-04-26 ENCOUNTER — Other Ambulatory Visit: Payer: Self-pay | Admitting: Family Medicine

## 2022-04-26 DIAGNOSIS — E032 Hypothyroidism due to medicaments and other exogenous substances: Secondary | ICD-10-CM

## 2022-04-26 DIAGNOSIS — K5909 Other constipation: Secondary | ICD-10-CM

## 2022-04-26 DIAGNOSIS — K219 Gastro-esophageal reflux disease without esophagitis: Secondary | ICD-10-CM

## 2022-04-26 LAB — SURGICAL PATHOLOGY

## 2022-04-26 NOTE — Telephone Encounter (Signed)
Tried to call pt to get her scheduled for a HFU. Tried both numbers. No answer and no option for VM  ? ? ? ? ? ?Copied from [place]. Topic: General - Other ?>> Apr 26, 2022  9:54 AM Loma Boston wrote: ?Reason for CRM: Maudie Mercury , nurse from [facility] has called needs to report that she had called pt in re to [facility] to home care and that it is mandatory they go over med records with pt and she states that pt was very upset about have the ostomy and she would not focus on meds.  Maudie Mercury is just document to PCP. She urged her to make a HFU. Kim's # is [phone], any agent, just documentation. ?

## 2022-04-26 NOTE — Telephone Encounter (Signed)
Copied from [place] [id number]. Topic: General - Other ?>> Apr 26, 2022  9:34 AM Leward Quan A wrote: ?Reason for CRM: [facility] rep called in to inquire of Dr Ancil Boozer to please contact patient since she just recently came out the hospital and need help setting up care with new ostomy and getting some personal care service. Please call patient at Ph#  [phone] ?

## 2022-04-27 NOTE — Telephone Encounter (Signed)
Spoke with the patient on 04-27-2022 and she asked me to call her back about scheduling her the Hospital  followup. She asked me to call her back for she was in the middle of placing a nicotine patch on. I did call her back with no answer and I did not close the message so it would remind me to try calling her again.

## 2022-04-27 NOTE — Telephone Encounter (Signed)
Pt was never scheduled due to call dropping. I tried to call her back and no answer. I tried again this morning and still no answering. I will continue to call in hopes to get her.  ?

## 2022-05-01 ENCOUNTER — Ambulatory Visit (HOSPITAL_COMMUNITY)
Admission: RE | Admit: 2022-05-01 | Discharge: 2022-05-01 | Disposition: A | Discharge status: Home / Self Care | Payer: Medicare Other | Source: Ambulatory Visit | Attending: Nurse Practitioner | Admitting: Nurse Practitioner

## 2022-05-01 DIAGNOSIS — L259 Unspecified contact dermatitis, unspecified cause: Secondary | ICD-10-CM | POA: Insufficient documentation

## 2022-05-01 DIAGNOSIS — L24B1 Irritant contact dermatitis related to digestive stoma or fistula: Secondary | ICD-10-CM | POA: Diagnosis not present

## 2022-05-01 DIAGNOSIS — K9400 Colostomy complication, unspecified: Secondary | ICD-10-CM

## 2022-05-01 DIAGNOSIS — Y838 Other surgical procedures as the cause of abnormal reaction of the patient, or of later complication, without mention of misadventure at the time of the procedure: Secondary | ICD-10-CM | POA: Diagnosis not present

## 2022-05-01 DIAGNOSIS — C20 Malignant neoplasm of rectum: Secondary | ICD-10-CM | POA: Diagnosis not present

## 2022-05-01 DIAGNOSIS — K9409 Other complications of colostomy: Secondary | ICD-10-CM | POA: Diagnosis present

## 2022-05-01 NOTE — Discharge Instructions (Signed)
WE are switching to 1 piece convex pouch  ?Apply stoma powder and skin prep to protect skin ?Apply barrier ring to peristomal skin ?Apply 1 piece convex pouch and ostomy belt. ?Will set up with [facility].  ?

## 2022-05-01 NOTE — Progress Notes (Signed)
[facility]  ? ?Reason for visit:  ?LLQ colostomy  Has been leaking, popping off and smelling bad.  ?HPI:  ?Rectal cancer with end colostomy, recessed with separation present circumferentially ?ROS  ?Review of Systems  ?Gastrointestinal:   ?     LLQ colostomy ?Recessed stoma ?Irritant dermatitis to peristomal skin  ?Skin:  Positive for rash.  ?     Dermatitis from leaking ostomy pouch.   ?All other systems reviewed and are negative. ?Vital signs:  ?BP 121/83   Pulse 96   Temp 98.5 ?F (36.9 ?C) (Oral)   Resp 18   SpO2 93%  ?Exam:  ?Physical Exam ?Vitals reviewed.  ?Abdominal:  ?   Palpations: Abdomen is soft.  ?   Comments: LLQ colostomy  ?Skin: ?   General: Skin is warm and dry.  ?   Findings: Rash present.  ?Neurological:  ?   Mental Status: She is alert and oriented to person, place, and time.  ?Psychiatric:     ?   Mood and Affect: Mood normal.  ?  ?Stoma type/location:  LLQ colostomy,  ?Stomal assessment/size:  1 3/8" recessed  stomal separation ?Peristomal assessment:   red and tender to touch. Pouch was leaking when I removed it.  ?Treatment options for stomal/peristomal skin: Will implement convex filtered pouch with barrier ring.  Stoma powder and skin prep and added an ostomy belt.  ?Needs to be set up for supplies. HH never came.  ?Output: soft brown stool ?Ostomy pouching: 1pc.convex with barrier ring ?Education provided:  Spouse and friend at bedside and observed pouch change.  Explained rationale for skin prep and powder, barrier ring and convex pouch.  Samples given will enroll with [facility].  ? ?  ?Impression/dx  ?Colostomy complication ?Contact irritant dermatitis ?Discussion  ?See back in a week.  Will set up for supplies with [facility] ?Plan  ?See back in one week.  ? ? ? ?Visit time: 55 minutes.  ? ?Domenic Moras FNP-BC ? ?  ?

## 2022-05-03 ENCOUNTER — Inpatient Hospital Stay: Payer: Medicare Other | Attending: Oncology | Admitting: Oncology

## 2022-05-03 ENCOUNTER — Other Ambulatory Visit: Payer: Self-pay

## 2022-05-03 ENCOUNTER — Encounter: Payer: Self-pay | Admitting: Oncology

## 2022-05-03 VITALS — BP 115/78 | HR 91 | Temp 97.1°F | Resp 18 | Wt 143.6 lb

## 2022-05-03 DIAGNOSIS — F319 Bipolar disorder, unspecified: Secondary | ICD-10-CM | POA: Diagnosis not present

## 2022-05-03 DIAGNOSIS — R140 Abdominal distension (gaseous): Secondary | ICD-10-CM | POA: Diagnosis not present

## 2022-05-03 DIAGNOSIS — F17210 Nicotine dependence, cigarettes, uncomplicated: Secondary | ICD-10-CM | POA: Diagnosis not present

## 2022-05-03 DIAGNOSIS — E785 Hyperlipidemia, unspecified: Secondary | ICD-10-CM | POA: Insufficient documentation

## 2022-05-03 DIAGNOSIS — Z7189 Other specified counseling: Secondary | ICD-10-CM | POA: Diagnosis not present

## 2022-05-03 DIAGNOSIS — J449 Chronic obstructive pulmonary disease, unspecified: Secondary | ICD-10-CM | POA: Insufficient documentation

## 2022-05-03 DIAGNOSIS — Z720 Tobacco use: Secondary | ICD-10-CM

## 2022-05-03 DIAGNOSIS — D751 Secondary polycythemia: Secondary | ICD-10-CM | POA: Diagnosis not present

## 2022-05-03 DIAGNOSIS — C20 Malignant neoplasm of rectum: Secondary | ICD-10-CM | POA: Diagnosis not present

## 2022-05-03 DIAGNOSIS — Z803 Family history of malignant neoplasm of breast: Secondary | ICD-10-CM | POA: Diagnosis not present

## 2022-05-03 DIAGNOSIS — I129 Hypertensive chronic kidney disease with stage 1 through stage 4 chronic kidney disease, or unspecified chronic kidney disease: Secondary | ICD-10-CM | POA: Diagnosis not present

## 2022-05-03 DIAGNOSIS — E1122 Type 2 diabetes mellitus with diabetic chronic kidney disease: Secondary | ICD-10-CM | POA: Diagnosis not present

## 2022-05-03 DIAGNOSIS — Z79899 Other long term (current) drug therapy: Secondary | ICD-10-CM | POA: Diagnosis not present

## 2022-05-03 DIAGNOSIS — Z800 Family history of malignant neoplasm of digestive organs: Secondary | ICD-10-CM | POA: Insufficient documentation

## 2022-05-03 DIAGNOSIS — N189 Chronic kidney disease, unspecified: Secondary | ICD-10-CM | POA: Insufficient documentation

## 2022-05-03 DIAGNOSIS — F419 Anxiety disorder, unspecified: Secondary | ICD-10-CM | POA: Diagnosis not present

## 2022-05-03 DIAGNOSIS — F209 Schizophrenia, unspecified: Secondary | ICD-10-CM | POA: Insufficient documentation

## 2022-05-03 DIAGNOSIS — E039 Hypothyroidism, unspecified: Secondary | ICD-10-CM | POA: Diagnosis not present

## 2022-05-03 DIAGNOSIS — K219 Gastro-esophageal reflux disease without esophagitis: Secondary | ICD-10-CM | POA: Insufficient documentation

## 2022-05-03 NOTE — Progress Notes (Signed)
The proposed treatment discussed in conference is for discussion purpose only and is not a binding recommendation.  The patients have not been physically examined, or presented with their treatment options.  Therefore, final treatment plans cannot be decided.  

## 2022-05-03 NOTE — Progress Notes (Addendum)
?Hematology/Oncology Progress note ?[phone] [phone] ?  ? ?  ? ?   ? ? ?Patient Care Team: ?Steele Sizer, MD as PCP - General (Family Medicine) ?Sharmaine Base, MD as Referring Physician (Psychiatry) ?Bjorn Loser, MD as Consulting Physician (Urology) ?Germaine Pomfret, [facility] (Pharmacist) ?[place], [place], [place] as Education officer, museum ?Clent Jacks, RN as Oncology Nurse Navigator ? ?REFERRING PROVIDER: ?Steele Sizer, MD  ?CHIEF COMPLAINTS/REASON FOR VISIT:  ?Evaluation of rectal cancer. ? ?HISTORY OF PRESENTING ILLNESS:  ? ?April Luna is a  51 y.o.  female with PMH listed below was seen in consultation at the request of  Steele Sizer, MD  for evaluation of rectal cancer ? ?Patient has bipolar and schizophrenia.  She is accompanied with her psychiatry nurse.  Patient is married and lives at home with her husband and son. ?Patient was seen by gastroenterologist for evaluation of chronic diarrhea and abdominal bloating.  She also has a history of chronic constipation. ?02/09/2022, patient had colonoscopy which showed renal mass 10 cm from anal verge.  5 mm polyp in ascending colon.  Removed and retrieved. ?Pathology showed rectal adenocarcinoma.  The polyp in the ascending colon is a tubular adenoma. ?She was referred to oncology for further evaluation and management. ? ?Patient denies unintentional weight loss, abdominal pain, blood in the stool. ?Family history significant for multiple family members with cancer. ? ?INTERVAL HISTORY ?April Luna is a 51 y.o. female who has above history reviewed by me today presents for follow up visit for management of stage III rectal cancer. ? ?04/21/2022, patient underwent robotic assisted ultralow anterior resection. ?Pathology showed moderately differentiated adenocarcinoma, 4.5 cm in maximal extent, with focal extension through muscularis propria into perirectal soft tissue.  3 lymph nodes positive for metastatic  carcinoma.  Negative margin. ? pT3 pN1b, MSI stable. ? ?Patient presents for discussion of pathology results and adjuvant management plan.  Patient has no new complaints today.  Denies any nausea vomiting diarrhea fever or chills.Denies abdominal pain.  No problem with colostomy. ? ?She was accompanied by psychiatry RN. ? ? ?Review of Systems  ?Constitutional:  Negative for appetite change, chills, fatigue and fever.  ?HENT:   Negative for hearing loss and voice change.   ?Eyes:  Negative for eye problems.  ?Respiratory:  Negative for chest tightness and cough.   ?Cardiovascular:  Negative for chest pain.  ?Gastrointestinal:  Negative for abdominal distention, abdominal pain and blood in stool.  ?Endocrine: Negative for hot flashes.  ?Genitourinary:  Negative for difficulty urinating and frequency.   ?Musculoskeletal:  Negative for arthralgias.  ?Skin:  Negative for itching and rash.  ?Neurological:  Negative for extremity weakness.  ?Hematological:  Negative for adenopathy.  ?Psychiatric/Behavioral:  Negative for confusion.   ? ?MEDICAL HISTORY:  ?Past Medical History:  ?Diagnosis Date  ? Allergy   ? pollen  ? Anxiety   ? Arthritis   ? right hip  ? Bipolar 1 disorder ([place])   ? Cancer [facility])   ? rectal  ? Chronic kidney disease   ? COPD (chronic obstructive pulmonary disease) ([place])   ? Depression   ? Family history of adverse reaction to anesthesia   ? grand father had a stroke during anesthesia  ? Family history of breast cancer   ? Family history of colon cancer   ? Family history of uterine cancer   ? GERD (gastroesophageal reflux disease)   ? History of kidney stones   ? Hyperlipidemia   ? Hypertension   ?  Hypothyroidism   ? Panic attack   ? Pneumonia   ? Psoriasis   ? Sleep apnea 08/11/2021  ? No CPAP  ? Type 2 diabetes mellitus with microalbuminuria, without long-term current use of insulin ([place]) 06/24/2019  ? ? ?SURGICAL HISTORY: ?Past Surgical History:  ?Procedure Laterality Date  ? BREAST BIOPSY Left  12/14/2021  ? [place] bx, venus marker, path pending  ? CESAREAN SECTION    ? COLONOSCOPY WITH PROPOFOL N/A 02/09/2022  ? Procedure: COLONOSCOPY WITH PROPOFOL;  Surgeon: Lin Landsman, MD;  Location: [place];  Service: Endoscopy;  Laterality: N/A;  sleep apnea  ? CYSTOSCOPY W/ RETROGRADES Left 11/08/2018  ? Procedure: CYSTOSCOPY WITH RETROGRADE PYELOGRAM;  Surgeon: Billey Co, MD;  Location: [facility];  Service: Urology;  Laterality: Left;  ? CYSTOSCOPY/URETEROSCOPY/HOLMIUM LASER/STENT PLACEMENT Left 11/08/2018  ? Procedure: CYSTOSCOPY/URETEROSCOPY/HOLMIUM LASER/STENT PLACEMENT;  Surgeon: Billey Co, MD;  Location: [facility];  Service: Urology;  Laterality: Left;  ? MOUTH SURGERY    ? wisdom teeth extraction  ? MOUTH SURGERY    ? teeth removal  ? POLYPECTOMY N/A 02/09/2022  ? Procedure: POLYPECTOMY;  Surgeon: Lin Landsman, MD;  Location: [place];  Service: Endoscopy;  Laterality: N/A;  ? XI ROBOTIC ASSISTED LOWER ANTERIOR RESECTION N/A 04/21/2022  ? Procedure: XI ROBOTIC ASSISTED LOWER ANTERIOR RESECTION WITH COLOSTOMY, BILATERAL TAP BLOCK, ASSESSMENT OF TISSUE PERFUSSION WITH FIREFLY INJECTION;  Surgeon: Ileana Roup, MD;  Location: [facility];  Service: General;  Laterality: N/A;  ? ? ?SOCIAL HISTORY: ?Social History  ? ?Socioeconomic History  ? Marital status: Married  ?  Spouse name: Montine Circle  ? Number of children: 1  ? Years of education: 42  ? Highest education level: High school graduate  ?Occupational History  ? Occupation: unemployed  ?  Comment: disabled  ?Tobacco Use  ? Smoking status: Every Day  ?  Packs/day: 2.00  ?  Years: 34.00  ?  Pack years: 68.00  ?  Types: Cigarettes  ?  Start date: 05/13/1985  ? Smokeless tobacco: Never  ? Tobacco comments:  ?  1 PPD 04/20/223  ?Vaping Use  ? Vaping Use: Former  ? Start date: 05/25/2018  ? Quit date: 09/24/2018  ?Substance and Sexual Activity  ? Alcohol use: Yes  ?  Alcohol/week: 4.0 standard drinks  ?  Types: 4  Glasses of wine per week  ?  Comment: occasional  ? Drug use: Not Currently  ?  Types: Marijuana  ?  Comment: none since August 2019  ? Sexual activity: Not Currently  ?  Birth control/protection: Post-menopausal  ?Other Topics Concern  ? Not on file  ?Social History Narrative  ? Not on file  ? ?Social Determinants of Health  ? ?Financial Resource Strain: Medium Risk  ? Difficulty of Paying Living Expenses: Somewhat hard  ?Food Insecurity: No Food Insecurity  ? Worried About Charity fundraiser in the Last Year: Never true  ? Ran Out of Food in the Last Year: Never true  ?Transportation Needs: Unmet Transportation Needs  ? Lack of Transportation (Medical): Yes  ? Lack of Transportation (Non-Medical): Yes  ?Physical Activity: Inactive  ? Days of Exercise per Week: 0 days  ? Minutes of Exercise per Session: 0 min  ?Stress: No Stress Concern Present  ? Feeling of Stress : Not at all  ?Social Connections: Moderately Integrated  ? Frequency of Communication with Friends and Family: More than three times a week  ? Frequency  of Social Gatherings with Friends and Family: Twice a week  ? Attends Religious Services: More than 4 times per year  ? Active Member of Clubs or Organizations: No  ? Attends Archivist Meetings: Never  ? Marital Status: Married  ?Intimate Partner Violence: Not At Risk  ? Fear of Current or Ex-Partner: No  ? Emotionally Abused: No  ? Physically Abused: No  ? Sexually Abused: No  ? ? ?FAMILY HISTORY: ?Family History  ?Problem Relation Age of Onset  ? Depression Mother   ? Anxiety disorder Mother   ? Diabetes Mother   ? Hypertension Mother   ? Hyperlipidemia Mother   ? Cancer Mother   ? Uterine cancer Mother 63  ? Cervical cancer Mother 29  ? Colon cancer Father   ? Depression Brother   ? Anxiety disorder Brother   ? Cancer Maternal Aunt   ?     unk types  ? Diabetes Mellitus II Maternal Grandmother   ? Hypercholesterolemia Maternal Grandmother   ? Breast cancer Maternal Grandmother   ? Cancer  Paternal Grandmother   ? Diabetes Paternal Grandmother   ? Melanoma Paternal Grandmother   ? Stomach cancer Paternal Grandmother   ? ? ?ALLERGIES:  is allergic to metformin and related, nsaids, perphenazine, sulfa

## 2022-05-03 NOTE — Progress Notes (Signed)
Pt here for follow up. No new concerns voiced.   

## 2022-05-04 ENCOUNTER — Encounter: Payer: Self-pay | Admitting: Oncology

## 2022-05-04 ENCOUNTER — Other Ambulatory Visit (HOSPITAL_COMMUNITY): Payer: Self-pay | Admitting: Nurse Practitioner

## 2022-05-04 DIAGNOSIS — Z433 Encounter for attention to colostomy: Secondary | ICD-10-CM

## 2022-05-04 DIAGNOSIS — L24B1 Irritant contact dermatitis related to digestive stoma or fistula: Secondary | ICD-10-CM

## 2022-05-04 MED ORDER — LIDOCAINE-PRILOCAINE 2.5-2.5 % EX CREA: TOPICAL_CREAM | CUTANEOUS | 3 refills | Status: DC

## 2022-05-04 MED ORDER — ONDANSETRON HCL 8 MG PO TABS: 8.0000 mg | ORAL_TABLET | Freq: Two times a day (BID) | ORAL | 1 refills | Status: DC | PRN

## 2022-05-04 MED ORDER — PROCHLORPERAZINE MALEATE 10 MG PO TABS
10.0000 mg | ORAL_TABLET | Freq: Four times a day (QID) | ORAL | 3 refills | Status: DC | PRN
Start: 2022-05-04 — End: 2022-09-24

## 2022-05-04 NOTE — Progress Notes (Signed)
Patient on plan of care prior to pathways. 

## 2022-05-04 NOTE — Progress Notes (Signed)
START ON PATHWAY REGIMEN - Colorectal ? ? ?  A cycle is every 14 days: ?    Oxaliplatin  ?    Leucovorin  ?    Fluorouracil  ?    Fluorouracil  ? ?**Always confirm dose/schedule in your pharmacy ordering system** ? ?Patient Characteristics: ?Postoperative without Neoadjuvant Therapy, M0 (Pathologic Staging), Rectal, pT4, pN0  or  Any pT, pN+ ?Tumor Location: Rectal ?Therapeutic Status: Postoperative without Neoadjuvant Therapy, M0 (Pathologic Staging) ?AJCC M Category: cM0 ?AJCC T Category: pT3 ?AJCC N Category: pN1b ?AJCC 8 Stage Grouping: IIIB ?Intent of Therapy: ?Curative Intent, Discussed with Patient ?

## 2022-05-05 ENCOUNTER — Telehealth: Payer: Self-pay

## 2022-05-05 NOTE — Telephone Encounter (Signed)
Spoke to pt to get her scheduled and she states she does not need an appt with Dr Ancil Boozer right now.  ? ? ?Copied from [place] [id number]. Topic: General - Inquiry ?>> May 05, 2022 10:10 AM Robina Ade, Helene Kelp D wrote: ?Reason for CRM: Patient called and would like to speak with pcp or new cma to give them an update on her situation. She said that Pathology report came back with Stage 3 from tumor 100% free but it has spread to her lymph note. She will start Chemo soon and she has an appt with Dr. Ancil Boozer on the 24th that she wish to be seen sooner. She will need 1-2 days for transportation arrangements. Please call patient back, thanks. ?

## 2022-05-08 ENCOUNTER — Ambulatory Visit (INDEPENDENT_AMBULATORY_CARE_PROVIDER_SITE_OTHER): Payer: Medicare Other | Admitting: *Deleted

## 2022-05-08 DIAGNOSIS — R809 Proteinuria, unspecified: Secondary | ICD-10-CM

## 2022-05-08 DIAGNOSIS — F3110 Bipolar disorder, current episode manic without psychotic features, unspecified: Secondary | ICD-10-CM

## 2022-05-08 DIAGNOSIS — E1129 Type 2 diabetes mellitus with other diabetic kidney complication: Secondary | ICD-10-CM

## 2022-05-08 DIAGNOSIS — C20 Malignant neoplasm of rectum: Secondary | ICD-10-CM

## 2022-05-08 NOTE — Patient Instructions (Signed)
Visit Information ? ?Thank you for taking time to visit with me today. Please don't hesitate to contact me if I can be of assistance to you before our next scheduled telephone appointment. ? ?Following are the goals we discussed today:  ? ?- arrange a ride through an agency 1 week before appointment ?- call to cancel if needed ?- keep a calendar with appointment dates -continue to utilize the ACT team and your transportation benefit on her [facility] plan to arrange transportation-please also contact the social worker at the [employer] for assistance with transportation if needed ? ?Our next appointment is by telephone on 05/29/22 at 1pm ? ?Please call the care guide team at [phone] if you need to cancel or reschedule your appointment.  ? ?If you are experiencing a Mental Health or [place] or need someone to talk to, please call the Suicide and Crisis Lifeline: 988  ? ?Patient verbalizes understanding of instructions and care plan provided today and agrees to view in [place]. Active MyChart status confirmed with patient.   ? ?Telephone follow up appointment with care management team member scheduled for:05/29/22 ? ?Yasuko Lapage, LCSW ?Clinical Social Worker  ?[facility] Care Management ?[phone] ? ?

## 2022-05-08 NOTE — Chronic Care Management (AMB) (Signed)
?Chronic Care Management  ? ? Clinical Social Work Note ? ?05/08/2022 ?Name: April Luna MRN: [id number] DOB: 1971-04-14 ? ?April Luna is a 51 y.o. year old female who is a primary care patient of Steele Sizer, MD. The CCM team was consulted to assist the patient with chronic disease management and/or care coordination needs related to: [employer] .  ? ?Engaged with patient by telephone for follow up visit in response to provider referral for social work chronic care management and care coordination services.  ? ?Consent to Services:  ?The patient was given information about Chronic Care Management services, agreed to services, and gave verbal consent prior to initiation of services.  Please see initial visit note for detailed documentation.  ? ?Patient agreed to services and consent obtained.  ? ?Assessment: Review of patient past medical history, allergies, medications, and health status, including review of relevant consultants reports was performed today as part of a comprehensive evaluation and provision of chronic care management and care coordination services.    ? ?SDOH (Social Determinants of Health) assessments and interventions performed:   ? ?Advanced Directives Status: Not addressed in this encounter. ? ?CCM Care Plan ? ?Allergies  ?Allergen Reactions  ? Metformin And Related Nausea And Vomiting  ? Nsaids Other (See Comments)  ?  Stage 3 CKD  ? Perphenazine Other (See Comments)  ?  Tremors, muscle weakness, tongue swelling  ? Sulfa Antibiotics Other (See Comments)  ?  GI distress ?  ? Abilify [Aripiprazole] Rash  ? Penicillins Rash  ?  She has taken amoxicillin without problems  ? ? ?Outpatient Encounter Medications as of 05/08/2022  ?Medication Sig Note  ? acetaminophen (TYLENOL) 325 MG tablet Take 650 mg by mouth every 6 (six) hours as needed for headache (pain).   ? albuterol (VENTOLIN HFA) 108 (90 Base) MCG/ACT inhaler Inhale 2 puffs into the lungs every 6 (six) hours as  needed for wheezing or shortness of breath.   ? amantadine (SYMMETREL) 100 MG capsule Take 100 mg by mouth 2 (two) times daily.   ? blood glucose meter kit and supplies Dispense based on patient and insurance preference. Use up to four times daily as directed. (FOR ICD-10 E10.9, E11.9).   ? Budeson-Glycopyrrol-Formoterol (BREZTRI AEROSPHERE) 160-9-4.8 MCG/ACT AERO Inhale 2 puffs into the lungs in the morning and at bedtime.   ? buPROPion (WELLBUTRIN XL) 150 MG 24 hr tablet Take 150 mg by mouth every morning.   ? Cholecalciferol (VITAMIN D-3) 125 MCG (5000 UT) TABS Take 5,000 Units by mouth daily. 04/21/2022: Pt is not sure when they stopped putting this in her pill pak but it is not currently on the package list  ? dapagliflozin propanediol (FARXIGA) 10 MG TABS tablet Take 1 tablet (10 mg total) by mouth daily before breakfast.   ? gabapentin (NEURONTIN) 300 MG capsule Take 300 mg by mouth 2 (two) times daily.   ? guaiFENesin (MUCINEX) 600 MG 12 hr tablet Take 2 tablets (1,200 mg total) by mouth 2 (two) times daily as needed for to loosen phlegm or cough.   ? hydrOXYzine (VISTARIL) 50 MG capsule Take 50 mg by mouth 3 (three) times daily as needed for anxiety.   ? icosapent Ethyl (VASCEPA) 1 g capsule Take 2 capsules (2 g total) by mouth 2 (two) times daily.   ? levothyroxine (SYNTHROID) 75 MCG tablet TAKE 1 TABLET BY MOUTH DAILY BEFORE BREAKFAST   ? lidocaine-prilocaine (EMLA) cream Apply to affected area once   ? linaclotide (  LINZESS) 145 MCG CAPS capsule TAKE 1 CAPSULE BY MOUTH DAILY BEFORE BREAKFAST (Patient not taking: Reported on 05/03/2022)   ? loratadine (CLARITIN) 10 MG tablet Take 10 mg by mouth every morning.   ? losartan (COZAAR) 25 MG tablet Take 1 tablet (25 mg total) by mouth daily.   ? nicotine (NICODERM CQ - DOSED IN MG/24 HR) 7 mg/24hr patch Place 1 patch (7 mg total) onto the skin daily. (Patient not taking: Reported on 04/21/2022) 04/21/2022: Pt cannot afford to pick up until 5/1  ? NON FORMULARY Pt  uses a cpap nightly 04/21/2022: Pt has ordered CPAP but has not yet received it.  ? ondansetron (ZOFRAN) 8 MG tablet Take 1 tablet (8 mg total) by mouth 2 (two) times daily as needed for refractory nausea / vomiting. Start on day 3 after chemotherapy.   ? paliperidone (INVEGA SUSTENNA) 234 MG/1.5ML SUSY injection Inject 234 mg into the muscle every 30 (thirty) days. On or about the 14th of each month   ? pantoprazole (PROTONIX) 40 MG tablet Take 1 tablet (40 mg total) by mouth daily with breakfast.   ? prochlorperazine (COMPAZINE) 10 MG tablet Take 1 tablet (10 mg total) by mouth every 6 (six) hours as needed for nausea or vomiting.   ? rosuvastatin (CRESTOR) 40 MG tablet Take 1 tablet (40 mg total) by mouth daily. (Patient taking differently: Take 40 mg by mouth every morning.)   ? sertraline (ZOLOFT) 100 MG tablet Take 200 mg by mouth every morning.   ? Spacer/Aero-Holding Chambers (AEROCHAMBER MV) inhaler Use as instructed   ? ?No facility-administered encounter medications on file as of 05/08/2022.  ? ? ?Patient Active Problem List  ? Diagnosis Date Noted  ? Goals of care, counseling/discussion 05/03/2022  ? Rectal cancer ([place]) 04/21/2022  ? Genetic testing 03/27/2022  ? Family history of colon cancer 03/14/2022  ? Family history of uterine cancer 03/14/2022  ? Family history of breast cancer 03/14/2022  ? Screening for colon cancer   ? Rectal mass   ? Polyp of ascending colon   ? Sleep apnea 08/11/2021  ? Plantar callus 10/06/2019  ? Acquired hallux limitus of both feet 10/06/2019  ? Type 2 diabetes mellitus with microalbuminuria, without long-term current use of insulin ([place]) 06/24/2019  ? Chronic obstructive pulmonary disease ([place]) 06/24/2019  ? Chronic constipation 06/24/2019  ? ASCUS of cervix with negative high risk HPV 09/05/2018  ? GERD without esophagitis 04/11/2018  ? Hyperlipidemia 04/11/2018  ? Hypertension 04/11/2018  ? Hypothyroidism 04/26/2015  ? Bipolar I disorder, most recent episode (or current)  manic ([place]) 04/25/2015  ? Delirium due to another medical condition 04/25/2015  ? Cannabis abuse 04/25/2015  ? ? ?Conditions to be addressed/monitored:  ?HTN, DMII, and Bipolar Disorder; Mental Health Concerns , transportation needs ?Care Plan : General Social Work (Adult)  ?Updates made by Vern Claude, LCSW since 05/08/2022 12:00 AM  ?  ? ?Problem: [facility] AMB "PATIENT-SPECIFIC PROBLEM"   ?Note:   ?CARE PLAN ENTRY ?(see longitudinal plan of care for additional care plan information) ? ?Current Barriers:  ?Patient with HTN, DM, and Bipolar Disorder in need of assistance with connection to community resources-patient states that she get's overwhelmed with the coordination of appointments and has a hard time following through ?Knowledge deficits and need for support, education and care coordination related to community resources support  ?Patient having difficulty keeping specialist appointments ? ?Clinical Goal(s)  ?Over the next  90  days patient will be able to schedule  and keep medical appointments as demonstrated by increased ability to schedule both the upcoming appointments and arranging transportation to get there. ?Over the next 90 days, patient will continue to work with the ACT Team (community agency) to maintain adherence to medical appointments and medication management ? ?Interventions provided by LCSW:  ?Continued to assess patient's care coordination needs related to specialist needs and discussed ongoing care management follow up  ?Patient confirmed that her cancer has now spread chemotherapy and radiation now recommended ?Emotional support provided, verbalization of feelings encouraged, positive coping strategies reinforced including relying on her faith and leaning on her support system, family, spouse, ACT Team ?Discussed additional supports(transportation, community resources) through the [employer] if needed ?Continued to confirm that the ACT Team social worker agreeabe to assisting patient  with transportation to medical appointments, as well as through her [facility] benefit ?Patient continues to remain hopeful, discussed feeling well informed regarding her treatment and verbalizes appropriate use

## 2022-05-09 ENCOUNTER — Other Ambulatory Visit: Payer: Self-pay | Admitting: Surgery

## 2022-05-09 DIAGNOSIS — C20 Malignant neoplasm of rectum: Secondary | ICD-10-CM

## 2022-05-09 NOTE — Progress Notes (Signed)
Patient on schedule for Port placement 05/11/2022, called and spoke with this patient on phone. Made aware to be here @ 1230, NPO after 0630, and driver post procedure/recovery/discharge. In regards to this patient's medical history, alert and oriented when spoken to on phone, stating she does sign for all of her procedures when I asked. She stated she has no car but will find transportation for procedure day for discharge. Stated understanding of instructions. ?

## 2022-05-10 ENCOUNTER — Other Ambulatory Visit: Payer: Self-pay | Admitting: Radiology

## 2022-05-10 ENCOUNTER — Institutional Professional Consult (permissible substitution): Payer: Medicare Other | Admitting: Radiation Oncology

## 2022-05-10 NOTE — H&P (Signed)
Chief Complaint: Patient was seen in consultation today for port-a-catheter placement   Referring Physician(s): White,Christopher M  Supervising Physician: Juliet Rude  Patient Status: [facility] - Out-pt  History of Present Illness: April Luna is a 51 y.o. female with a medical history significant for Bipolar 1 disorder, schizophrenia, chronic kidney disease, COPD, DM2, HTN and recently diagnosed rectal cancer. She was evaluated by GI for chronic diarrhea and bloating. A colonoscopy 02/09/22 showed a rectal mass and pathology was positive for adenocarcinoma. She is now s/p robotic-assisted ultra-low anterior resection with end colostomy (procedure done at [facility]).   Her oncology team is preparing her for chemotherapy. Interventional Radiology has been asked to evaluate this patient for an image-guided port-a-catheter placement to facilitate her treatment goals.   Past Medical History:  Diagnosis Date   Allergy    pollen   Anxiety    Arthritis    right hip   Bipolar 1 disorder (HCC)    Cancer (HCC)    rectal   Chronic kidney disease    COPD (chronic obstructive pulmonary disease) (HCC)    Depression    Family history of adverse reaction to anesthesia    grand father had a stroke during anesthesia   Family history of breast cancer    Family history of colon cancer    Family history of uterine cancer    GERD (gastroesophageal reflux disease)    History of kidney stones    Hyperlipidemia    Hypertension    Hypothyroidism    Panic attack    Pneumonia    Psoriasis    Sleep apnea 08/11/2021   No CPAP   Type 2 diabetes mellitus with microalbuminuria, without long-term current use of insulin ([place]) 06/24/2019    Past Surgical History:  Procedure Laterality Date   BREAST BIOPSY Left 12/14/2021   [place] bx, venus marker, path pending   CESAREAN SECTION     COLONOSCOPY WITH PROPOFOL N/A 02/09/2022   Procedure: COLONOSCOPY WITH PROPOFOL;  Surgeon: Lin Landsman,  MD;  Location: [place];  Service: Endoscopy;  Laterality: N/A;  sleep apnea   CYSTOSCOPY W/ RETROGRADES Left 11/08/2018   Procedure: CYSTOSCOPY WITH RETROGRADE PYELOGRAM;  Surgeon: Billey Co, MD;  Location: [facility];  Service: Urology;  Laterality: Left;   CYSTOSCOPY/URETEROSCOPY/HOLMIUM LASER/STENT PLACEMENT Left 11/08/2018   Procedure: CYSTOSCOPY/URETEROSCOPY/HOLMIUM LASER/STENT PLACEMENT;  Surgeon: Billey Co, MD;  Location: [facility];  Service: Urology;  Laterality: Left;   MOUTH SURGERY     wisdom teeth extraction   MOUTH SURGERY     teeth removal   POLYPECTOMY N/A 02/09/2022   Procedure: POLYPECTOMY;  Surgeon: Lin Landsman, MD;  Location: [place];  Service: Endoscopy;  Laterality: N/A;   XI ROBOTIC ASSISTED LOWER ANTERIOR RESECTION N/A 04/21/2022   Procedure: XI ROBOTIC ASSISTED LOWER ANTERIOR RESECTION WITH COLOSTOMY, BILATERAL TAP BLOCK, ASSESSMENT OF TISSUE PERFUSSION WITH FIREFLY INJECTION;  Surgeon: Ileana Roup, MD;  Location: [facility];  Service: General;  Laterality: N/A;    Allergies: Metformin and related, Nsaids, Perphenazine, Sulfa antibiotics, Abilify [aripiprazole], and Penicillins  Medications: Prior to Admission medications   Medication Sig Start Date End Date Taking? Authorizing Provider  acetaminophen (TYLENOL) 325 MG tablet Take 650 mg by mouth every 6 (six) hours as needed for headache (pain).    [provider]  albuterol (VENTOLIN HFA) 108 (90 Base) MCG/ACT inhaler Inhale 2 puffs into the lungs every 6 (six) hours as needed for wheezing or shortness of  breath. 04/13/22   Sood, Vineet, MD  amantadine (SYMMETREL) 100 MG capsule Take 100 mg by mouth 2 (two) times daily. 03/19/18   Agarwal, [place], MD  blood glucose meter kit and supplies Dispense based on patient and insurance preference. Use up to four times daily as directed. (FOR ICD-10 E10.9, E11.9). 03/15/22   Sowles, Krichna, MD   Budeson-Glycopyrrol-Formoterol (BREZTRI AEROSPHERE) 160-9-4.8 MCG/ACT AERO Inhale 2 puffs into the lungs in the morning and at bedtime. 04/13/22   Sood, Vineet, MD  buPROPion (WELLBUTRIN XL) 150 MG 24 hr tablet Take 150 mg by mouth every morning. 04/09/18   [employer] [facility] [facility] [place] [employer]  Cholecalciferol (VITAMIN D-3) 125 MCG (5000 UT) TABS Take 5,000 Units by mouth daily. 10/14/19   Sowles, Krichna, MD  dapagliflozin propanediol (FARXIGA) 10 MG TABS tablet Take 1 tablet (10 mg total) by mouth daily before breakfast. 03/16/22   Sowles, Krichna, MD  gabapentin (NEURONTIN) 300 MG capsule Take 300 mg by mouth 2 (two) times daily.    [employer] [facility] [facility] [place] [employer]  guaiFENesin (MUCINEX) 600 MG 12 hr tablet Take 2 tablets (1,200 mg total) by mouth 2 (two) times daily as needed for to loosen phlegm or cough. 04/13/22   Sood, Vineet, MD  hydrOXYzine (VISTARIL) 50 MG capsule Take 50 mg by mouth 3 (three) times daily as needed for anxiety.    [employer] [place] [employer]  icosapent Ethyl (VASCEPA) 1 g capsule Take 2 capsules (2 g total) by mouth 2 (two) times daily. 03/15/22   Sowles, Krichna, MD  levothyroxine (SYNTHROID) 75 MCG tablet TAKE 1 TABLET BY MOUTH DAILY BEFORE BREAKFAST 04/26/22   Sowles, Krichna, MD  lidocaine-prilocaine (EMLA) cream Apply to affected area once 05/04/22   Yu, Zhou, MD  linaclotide (LINZESS) 145 MCG CAPS capsule TAKE 1 CAPSULE BY MOUTH DAILY BEFORE BREAKFAST Patient not taking: Reported on 05/03/2022 04/26/22   Vanga, Rohini Reddy, MD  loratadine (CLARITIN) 10 MG tablet Take 10 mg by mouth every morning.    [provider]  losartan (COZAAR) 25 MG tablet Take 1 tablet (25 mg total) by mouth daily. 03/15/22   Sowles, Krichna, MD  nicotine (NICODERM CQ - DOSED IN MG/24 HR) 7 mg/24hr patch Place 1 patch (7 mg total) onto the skin daily. Patient not taking: Reported on 04/21/2022 04/13/22   Sood, Vineet, MD  NON FORMULARY Pt uses a  cpap nightly    [provider]  ondansetron (ZOFRAN) 8 MG tablet Take 1 tablet (8 mg total) by mouth 2 (two) times daily as needed for refractory nausea / vomiting. Start on day 3 after chemotherapy. 05/04/22   Yu, Zhou, MD  paliperidone (INVEGA SUSTENNA) 234 MG/1.5ML SUSY injection Inject 234 mg into the muscle every 30 (thirty) days. On or about the 14th of each month    [provider]  pantoprazole (PROTONIX) 40 MG tablet Take 1 tablet (40 mg total) by mouth daily with breakfast. 04/26/22   Sowles, Krichna, MD  prochlorperazine (COMPAZINE) 10 MG tablet Take 1 tablet (10 mg total) by mouth every 6 (six) hours as needed for nausea or vomiting. 05/04/22   Yu, Zhou, MD  rosuvastatin (CRESTOR) 40 MG tablet Take 1 tablet (40 mg total) by mouth daily. Patient taking differently: Take 40 mg by mouth every morning. 03/15/22   Sowles, Krichna, MD  sertraline (ZOLOFT) 100 MG tablet Take 200 mg by mouth every morning.    [facility] [facility] [place] [employer]  Spacer/Aero-Holding Chambers (AEROCHAMBER MV) inhaler Use as instructed 04/13/22   Sood, Vineet, MD     Family History  Problem Relation Age of Onset   Depression Mother    Anxiety disorder Mother    Diabetes Mother    Hypertension Mother    Hyperlipidemia Mother    Cancer Mother    Uterine cancer Mother 50   Cervical cancer Mother 50   Colon cancer Father    Depression Brother    Anxiety disorder Brother    Cancer Maternal Aunt        unk types   Diabetes Mellitus II Maternal Grandmother    Hypercholesterolemia Maternal Grandmother    Breast cancer Maternal Grandmother    Cancer Paternal Grandmother    Diabetes Paternal Grandmother    Melanoma Paternal Grandmother    Stomach cancer Paternal Grandmother     Social History   Socioeconomic History   Marital status: Married    Spouse name: Benjamin Lee Gray   Number of children: 1   Years of education: 12   Highest education level: High school graduate   Occupational History   Occupation: unemployed    Comment: disabled  Tobacco Use   Smoking status: Every Day    Packs/day: 0.25    Years: 34.00    Pack years: 8.50    Types: Cigarettes    Start date: 05/13/1985   Smokeless tobacco: Never   Tobacco comments:    1 PPD 04/20/223  Vaping Use   Vaping Use: Former   Start date: 05/25/2018   Quit date: 09/24/2018  Substance and Sexual Activity   Alcohol use: Not Currently    Alcohol/week: 4.0 standard drinks    Types: 4 Glasses of wine per week    Comment: quit ETOH in Feb. 2023   Drug use: Not Currently    Types: Marijuana    Comment: 2 days ago   Sexual activity: Not Currently    Birth control/protection: Post-menopausal  Other Topics Concern   Not on file  Social History Narrative   Lives with husband and son    Social Determinants of Health   Financial Resource Strain: Medium Risk   Difficulty of Paying Living Expenses: Somewhat hard  Food Insecurity: No Food Insecurity   Worried About Running Out of Food in the Last Year: Never true   Ran Out of Food in the Last Year: Never true  Transportation Needs: Unmet Transportation Needs   Lack of Transportation (Medical): Yes   Lack of Transportation (Non-Medical): Yes  Physical Activity: Inactive   Days of Exercise per Week: 0 days   Minutes of Exercise per Session: 0 min  Stress: No Stress Concern Present   Feeling of Stress : Not at all  Social Connections: Moderately Integrated   Frequency of Communication with Friends and Family: More than three times a week   Frequency of Social Gatherings with Friends and Family: Twice a week   Attends Religious Services: More than 4 times per year   Active Member of Clubs or Organizations: No   Attends Club or Organization Meetings: Never   Marital Status: Married    Review of Systems: A 12 point ROS discussed and pertinent positives are indicated in the HPI above.  All other systems are negative.  Review of Systems   Constitutional:  Negative for appetite change and fatigue.  Respiratory:  Negative for cough and shortness of breath.   Cardiovascular:  Negative for chest pain and leg swelling.    Gastrointestinal:  Positive for rectal pain. Negative for abdominal pain, diarrhea, nausea and vomiting.  Neurological:  Negative for headaches.   Vital Signs: BP 101/81   Pulse 75   Ht 4' 11" (1.499 m)   Wt 145 lb (65.8 kg)   SpO2 93%   BMI 29.29 kg/m   Physical Exam Constitutional:      General: She is not in acute distress.    Appearance: She is not ill-appearing.  HENT:     Mouth/Throat:     Mouth: Mucous membranes are dry.     Pharynx: Oropharynx is clear.  Cardiovascular:     Rate and Rhythm: Normal rate and regular rhythm.     Pulses: Normal pulses.     Heart sounds: Normal heart sounds.  Pulmonary:     Effort: Pulmonary effort is normal.     Breath sounds: Normal breath sounds.  Abdominal:     General: Bowel sounds are normal.     Palpations: Abdomen is soft.  Skin:    General: Skin is warm and dry.  Neurological:     Mental Status: She is alert and oriented to person, place, and time.    Imaging: No results found.  Labs:  CBC: Recent Labs    02/17/22 1310 04/21/22 0632 04/22/22 0355 04/23/22 0358  WBC 11.1* 13.7* 13.1* 11.0*  HGB 15.8* 15.0 13.5 13.8  HCT 47.3* 42.7 40.0 41.4  PLT 175 172 151 138*    COAGS: No results for input(s): INR, APTT in the last 8760 hours.  BMP: Recent Labs    06/20/21 1234 08/11/21 1031 02/17/22 1310 04/21/22 0632 04/22/22 0355 04/23/22 0358  NA 139   < > 137 136 143 144  K 3.8   < > 4.4 3.6 3.9 3.8  CL 99   < > 97* 106 109 107  CO2 31   < > 33* _0 GLUCOSE 91   < > 97 163* 115* 135*  BUN 10   < > _1 CALCIUM 10.7*   < > 9.8 9.0 9.2 9.0  CREATININE 0.97   < > 1.11* 0.94 0.87 0.76  GFRNONAA 69  --  >60 >60 >60 >60  GFRAA 80  --   --   --   --   --    < > = values in this interval not displayed.    LIVER  FUNCTION TESTS: Recent Labs    06/20/21 1234 08/11/21 1031 02/17/22 1310  BILITOT 0.5 0.2 0.2*  AST _2 ALT _3 ALKPHOS  --   --  116  PROT 6.9 6.6 8.1  ALBUMIN  --   --  4.4    TUMOR MARKERS: No results for input(s): AFPTM, CEA, CA199, CHROMGRNA in the last 8760 hours.  Assessment and Plan:  Rectal cancer; pending chemotherapy: April Luna, 51 year old female, presents today to the [facility] Interventional Radiology department for an image-guided port-a-catheter placement.  Risks and benefits of image-guided port-a-catheter placement were discussed with the patient including, but not limited to bleeding, infection, pneumothorax, or fibrin sheath development and need for additional procedures.  All of the patient's questions were answered, patient is agreeable to proceed. She has been NPO.   Consent signed and in chart.  Thank you for this interesting consult.  I greatly enjoyed meeting April Luna and look forward to participating in their care.  A copy of this report was sent to the  requesting provider on this date.  Electronically Signed:  , AGACNP-BC [phone] 05/11/2022, 1:12 PM   I spent a total of  30 Minutes   in face to face in clinical consultation, greater than 50% of which was counseling/coordinating care for port-a-catheter placement.   

## 2022-05-11 ENCOUNTER — Ambulatory Visit
Admission: RE | Admit: 2022-05-11 | Discharge: 2022-05-11 | Disposition: A | Discharge status: Home / Self Care | Payer: Medicare Other | Source: Ambulatory Visit | Attending: Surgery | Admitting: Surgery

## 2022-05-11 ENCOUNTER — Encounter: Payer: Self-pay | Admitting: Radiology

## 2022-05-11 ENCOUNTER — Other Ambulatory Visit: Payer: Self-pay

## 2022-05-11 DIAGNOSIS — I10 Essential (primary) hypertension: Secondary | ICD-10-CM | POA: Diagnosis not present

## 2022-05-11 DIAGNOSIS — C20 Malignant neoplasm of rectum: Secondary | ICD-10-CM | POA: Insufficient documentation

## 2022-05-11 DIAGNOSIS — E1122 Type 2 diabetes mellitus with diabetic chronic kidney disease: Secondary | ICD-10-CM | POA: Insufficient documentation

## 2022-05-11 DIAGNOSIS — N189 Chronic kidney disease, unspecified: Secondary | ICD-10-CM | POA: Insufficient documentation

## 2022-05-11 DIAGNOSIS — J449 Chronic obstructive pulmonary disease, unspecified: Secondary | ICD-10-CM | POA: Diagnosis not present

## 2022-05-11 HISTORY — PX: IR IMAGING GUIDED PORT INSERTION: IMG5740

## 2022-05-11 LAB — GLUCOSE, CAPILLARY: Glucose-Capillary: 130 mg/dL — ABNORMAL HIGH (ref 70–99)

## 2022-05-11 MED ORDER — MIDAZOLAM HCL 2 MG/2ML IJ SOLN
INTRAMUSCULAR | Status: AC | PRN
  Administered 2022-05-11: 1 mg via INTRAVENOUS

## 2022-05-11 MED ORDER — LIDOCAINE-EPINEPHRINE 1 %-1:100000 IJ SOLN
INTRAMUSCULAR | Status: AC
  Administered 2022-05-11: 19 mL
  Filled 2022-05-11: qty 1

## 2022-05-11 MED ORDER — FENTANYL CITRATE (PF) 100 MCG/2ML IJ SOLN
INTRAMUSCULAR | Status: AC | PRN
  Administered 2022-05-11: 50 ug via INTRAVENOUS

## 2022-05-11 MED ORDER — MIDAZOLAM HCL 2 MG/2ML IJ SOLN
INTRAMUSCULAR | Status: AC
  Filled 2022-05-11: qty 2

## 2022-05-11 MED ORDER — SODIUM CHLORIDE 0.9 % IV SOLN
INTRAVENOUS | Status: DC
  Filled 2022-05-11: qty 1000

## 2022-05-11 MED ORDER — FENTANYL CITRATE (PF) 100 MCG/2ML IJ SOLN
INTRAMUSCULAR | Status: AC
  Filled 2022-05-11: qty 2

## 2022-05-11 MED ORDER — HEPARIN SOD (PORK) LOCK FLUSH 100 UNIT/ML IV SOLN
INTRAVENOUS | Status: AC
  Administered 2022-05-11: 500 [IU]
  Filled 2022-05-11: qty 5

## 2022-05-11 NOTE — Procedures (Signed)
Interventional Radiology Procedure Note  Date of Procedure: 05/11/2022  Procedure: Port placement   Findings:  1. Right chest port placement via right IJ    Complications: No immediate complications noted.   Estimated Blood Loss: minimal  Follow-up and Recommendations: 1. Ready for use    Albin Felling, MD  Vascular & Interventional Radiology  05/11/2022 2:47 PM

## 2022-05-15 ENCOUNTER — Inpatient Hospital Stay: Payer: Medicare Other

## 2022-05-16 MED FILL — Dexamethasone Sodium Phosphate Inj 100 MG/10ML: INTRAMUSCULAR | Qty: 1 | Status: AC

## 2022-05-17 ENCOUNTER — Other Ambulatory Visit: Payer: Self-pay | Admitting: Gastroenterology

## 2022-05-17 ENCOUNTER — Inpatient Hospital Stay: Payer: Medicare Other

## 2022-05-17 ENCOUNTER — Inpatient Hospital Stay: Payer: Medicare Other | Admitting: Oncology

## 2022-05-17 DIAGNOSIS — Z5111 Encounter for antineoplastic chemotherapy: Secondary | ICD-10-CM | POA: Insufficient documentation

## 2022-05-17 DIAGNOSIS — K5909 Other constipation: Secondary | ICD-10-CM

## 2022-05-18 ENCOUNTER — Other Ambulatory Visit: Payer: Self-pay | Admitting: Gastroenterology

## 2022-05-18 ENCOUNTER — Telehealth: Payer: Self-pay | Admitting: Gastroenterology

## 2022-05-18 NOTE — Telephone Encounter (Signed)
Tried to call patient back but number is disconnected

## 2022-05-18 NOTE — Telephone Encounter (Signed)
Patient left vm stating that she has tried to order supplies 4 times and it has not come yet. Patient states she is leaking? Wants Dr Marius Ditch or her nurse to give her nurse a call at [phone]

## 2022-05-19 ENCOUNTER — Inpatient Hospital Stay: Payer: Medicare Other

## 2022-05-19 NOTE — Progress Notes (Signed)
Pharmacist Chemotherapy Monitoring - Initial Assessment    Anticipated start date: 05/26/22   The following has been reviewed per standard work regarding the patient's treatment regimen: The patient's diagnosis, treatment plan and drug doses, and organ/hematologic function Lab orders and baseline tests specific to treatment regimen  The treatment plan start date, drug sequencing, and pre-medications Prior authorization status  Patient's documented medication list, including drug-drug interaction screen and prescriptions for anti-emetics and supportive care specific to the treatment regimen The drug concentrations, fluid compatibility, administration routes, and timing of the medications to be used The patient's access for treatment and lifetime cumulative dose history, if applicable  The patient's medication allergies and previous infusion related reactions, if applicable   Changes made to treatment plan:  treatment plan date  Follow up needed:  [place], [place], 05/19/2022  9:20 AM

## 2022-05-24 ENCOUNTER — Ambulatory Visit: Payer: Self-pay | Admitting: *Deleted

## 2022-05-24 ENCOUNTER — Telehealth: Payer: Self-pay

## 2022-05-24 DIAGNOSIS — E118 Type 2 diabetes mellitus with unspecified complications: Secondary | ICD-10-CM

## 2022-05-24 DIAGNOSIS — E1129 Type 2 diabetes mellitus with other diabetic kidney complication: Secondary | ICD-10-CM

## 2022-05-24 DIAGNOSIS — F319 Bipolar disorder, unspecified: Secondary | ICD-10-CM

## 2022-05-24 DIAGNOSIS — C20 Malignant neoplasm of rectum: Secondary | ICD-10-CM

## 2022-05-24 DIAGNOSIS — F3110 Bipolar disorder, current episode manic without psychotic features, unspecified: Secondary | ICD-10-CM

## 2022-05-24 DIAGNOSIS — I10 Essential (primary) hypertension: Secondary | ICD-10-CM

## 2022-05-24 NOTE — Telephone Encounter (Unsigned)
Copied from [place] [id number]. Topic: General - Other >> May 24, 2022  1:30 PM Yvette Rack wrote: Reason for CRM: Pt requested to speak with [facility]. Pt reports that she will start chemo on 05/26/22 and she would like to discuss getting a home health aid. Pt requests return call. Cb# [phone]

## 2022-05-25 MED FILL — Dexamethasone Sodium Phosphate Inj 100 MG/10ML: INTRAMUSCULAR | Qty: 1 | Status: AC

## 2022-05-25 NOTE — Patient Instructions (Signed)
Visit Information  Thank you for taking time to visit with me today. Please don't hesitate to contact me if I can be of assistance to you before our next scheduled telephone appointment.  Following are the goals we discussed today:   - arrange a ride through an agency 1 week before appointment - call to cancel if needed - keep a calendar with appointment dates -continue to utilize the ACT team and your transportation benefit on her [facility] plan to arrange transportation   Please call the care guide team at [phone] if you need to cancel or reschedule your appointment.   If you are experiencing a Mental Health or [place] or need someone to talk to, please call the Suicide and Crisis Lifeline: 988   Patient verbalizes understanding of instructions and care plan provided today and agrees to view in [place]. Active MyChart status and patient understanding of how to access instructions and care plan via MyChart confirmed with patient.     No further follow up required: patient to contact this Education officer, museum with any additional community resource needs  [employer], [facility] Care Management [phone]

## 2022-05-25 NOTE — Chronic Care Management (AMB) (Signed)
Chronic Care Management    Clinical Social Work Note  05/25/2022 Name: DERIANA VANDERHOEF MRN: [id number] DOB: 24-Jan-1971  TRANESHA LESSNER is a 51 y.o. year old female who is a primary care patient of Steele Sizer, MD. The CCM team was consulted to assist the patient with chronic disease management and/or care coordination needs related to: [employer] .   Engaged with patient by telephone for follow up visit in response to provider referral for social work chronic care management and care coordination services.   Consent to Services:  The patient was given information about Chronic Care Management services, agreed to services, and gave verbal consent prior to initiation of services.  Please see initial visit note for detailed documentation.   Patient agreed to services and consent obtained.   Assessment: Review of patient past medical history, allergies, medications, and health status, including review of relevant consultants reports was performed today as part of a comprehensive evaluation and provision of chronic care management and care coordination services.     SDOH (Social Determinants of Health) assessments and interventions performed:    Advanced Directives Status: Not addressed in this encounter.  CCM Care Plan  Allergies  Allergen Reactions   Metformin And Related Nausea And Vomiting   Nsaids Other (See Comments)    Stage 3 CKD   Perphenazine Other (See Comments)    Tremors, muscle weakness, tongue swelling   Sulfa Antibiotics Other (See Comments)    GI distress    Abilify [Aripiprazole] Rash   Penicillins Rash    She has taken amoxicillin without problems    Outpatient Encounter Medications as of 05/24/2022  Medication Sig Note   acetaminophen (TYLENOL) 325 MG tablet Take 650 mg by mouth every 6 (six) hours as needed for headache (pain).    albuterol (VENTOLIN HFA) 108 (90 Base) MCG/ACT inhaler Inhale 2 puffs into the lungs every 6 (six) hours as  needed for wheezing or shortness of breath.    amantadine (SYMMETREL) 100 MG capsule Take 100 mg by mouth 2 (two) times daily.    blood glucose meter kit and supplies Dispense based on patient and insurance preference. Use up to four times daily as directed. (FOR ICD-10 E10.9, E11.9).    Budeson-Glycopyrrol-Formoterol (BREZTRI AEROSPHERE) 160-9-4.8 MCG/ACT AERO Inhale 2 puffs into the lungs in the morning and at bedtime.    buPROPion (WELLBUTRIN XL) 150 MG 24 hr tablet Take 150 mg by mouth every morning.    Cholecalciferol (VITAMIN D-3) 125 MCG (5000 UT) TABS Take 5,000 Units by mouth daily. 04/21/2022: Pt is not sure when they stopped putting this in her pill pak but it is not currently on the package list   dapagliflozin propanediol (FARXIGA) 10 MG TABS tablet Take 1 tablet (10 mg total) by mouth daily before breakfast.    gabapentin (NEURONTIN) 300 MG capsule Take 300 mg by mouth 2 (two) times daily.    guaiFENesin (MUCINEX) 600 MG 12 hr tablet Take 2 tablets (1,200 mg total) by mouth 2 (two) times daily as needed for to loosen phlegm or cough.    hydrOXYzine (VISTARIL) 50 MG capsule Take 50 mg by mouth 3 (three) times daily as needed for anxiety.    icosapent Ethyl (VASCEPA) 1 g capsule Take 2 capsules (2 g total) by mouth 2 (two) times daily.    levothyroxine (SYNTHROID) 75 MCG tablet TAKE 1 TABLET BY MOUTH DAILY BEFORE BREAKFAST    lidocaine-prilocaine (EMLA) cream Apply to affected area once    linaclotide (  LINZESS) 145 MCG CAPS capsule TAKE 1 CAPSULE BY MOUTH DAILY BEFORE BREAKFAST    loratadine (CLARITIN) 10 MG tablet Take 10 mg by mouth every morning.    losartan (COZAAR) 25 MG tablet Take 1 tablet (25 mg total) by mouth daily.    nicotine (NICODERM CQ - DOSED IN MG/24 HR) 7 mg/24hr patch Place 1 patch (7 mg total) onto the skin daily. (Patient not taking: Reported on 04/21/2022) 04/21/2022: Pt cannot afford to pick up until 5/1   NON FORMULARY Pt uses a cpap nightly 04/21/2022: Pt has ordered  CPAP but has not yet received it.   ondansetron (ZOFRAN) 8 MG tablet Take 1 tablet (8 mg total) by mouth 2 (two) times daily as needed for refractory nausea / vomiting. Start on day 3 after chemotherapy.    paliperidone (INVEGA SUSTENNA) 234 MG/1.5ML SUSY injection Inject 234 mg into the muscle every 30 (thirty) days. On or about the 14th of each month    pantoprazole (PROTONIX) 40 MG tablet Take 1 tablet (40 mg total) by mouth daily with breakfast.    prochlorperazine (COMPAZINE) 10 MG tablet Take 1 tablet (10 mg total) by mouth every 6 (six) hours as needed for nausea or vomiting.    rosuvastatin (CRESTOR) 40 MG tablet Take 1 tablet (40 mg total) by mouth daily. (Patient taking differently: Take 40 mg by mouth every morning.)    sertraline (ZOLOFT) 100 MG tablet Take 200 mg by mouth every morning.    Spacer/Aero-Holding Chambers (AEROCHAMBER MV) inhaler Use as instructed    No facility-administered encounter medications on file as of 05/24/2022.    Patient Active Problem List   Diagnosis Date Noted   Encounter for antineoplastic chemotherapy 05/17/2022   Goals of care, counseling/discussion 05/03/2022   Rectal cancer ([place]) 04/21/2022   Genetic testing 03/27/2022   Family history of colon cancer 03/14/2022   Family history of uterine cancer 03/14/2022   Family history of breast cancer 03/14/2022   Screening for colon cancer    Rectal mass    Polyp of ascending colon    Sleep apnea 08/11/2021   Plantar callus 10/06/2019   Acquired hallux limitus of both feet 10/06/2019   Type 2 diabetes mellitus with microalbuminuria, without long-term current use of insulin ([place]) 06/24/2019   Chronic obstructive pulmonary disease ([place]) 06/24/2019   Chronic constipation 06/24/2019   ASCUS of cervix with negative high risk HPV 09/05/2018   GERD without esophagitis 04/11/2018   Hyperlipidemia 04/11/2018   Hypertension 04/11/2018   Hypothyroidism 04/26/2015   Bipolar I disorder, most recent episode (or  current) manic ([place]) 04/25/2015   Delirium due to another medical condition 04/25/2015   Cannabis abuse 04/25/2015    Conditions to be addressed/monitored:   HTN, DMII, and Bipolar Disorder; Mental Health Concerns , transportation needs Care Plan : General Social Work (Adult)  Updates made by [employer], Darla Lesches, LCSW since 05/25/2022 12:00 AM     Problem: [facility] AMB "PATIENT-SPECIFIC PROBLEM"   Note:   CARE PLAN ENTRY (see longitudinal plan of care for additional care plan information)  Current Barriers:  Patient with HTN, DM, and Bipolar Disorder in need of assistance with connection to community resources-patient states that she get's overwhelmed with the coordination of appointments and has a hard time following through Knowledge deficits and need for support, education and care coordination related to community resources support  Patient having difficulty keeping specialist appointments  Clinical Goal(s)  Over the next  90  days patient will be able  to schedule and keep medical appointments as demonstrated by increased ability to schedule both the upcoming appointments and arranging transportation to get there. Over the next 90 days, patient will continue to work with the ACT Team (community agency) to maintain adherence to medical appointments and medication management  Interventions provided by LCSW:  Continued to assess patient's care coordination needs related to specialist needs and discussed ongoing care management follow up  Patient confirmed that her cancer has now spread chemotherapy and radiation now recommended to start next week Patient requesting an in home aid to assist with recovery following her chemotherapy treatments-collaboration phone call to the social worker at the cancer center who confirmed that patient would need to meet certain eligibility criteria for personal care services-patient primary care MD would need to complete the necessary paperwork to start process It  was confirmed that patient is not in need of personal care services at this time but in anticipating need in the future It was explained that process can start once the need arises Emotional support provided, verbalization of feelings encouraged, positive coping strategies reinforced including relying on her faith and leaning on her support system, family, spouse, ACT Team Discussed additional supports(transportation, community resources) through the [employer] if needed-patient confirmed that she will have to utilize her insurance benefit to meet her transportation needs Continued to confirm that the ACT Team social worker agreeabe to assisting patient with transportation to medical appointments, as well as through her [facility] benefit Patient continues to remain hopeful, discussed feeling well informed regarding her treatment and verbalizes appropriate use of self care strategies Patient Self Care Activities & Deficits:  Patient is unable to independently navigate community resource options without care coordination support  Acknowledges deficits and is motivated to resolve concern  Patient is able to contact her Act Team  as discussed today for additional assistance with coordinating specialist appointments Unable to perform IADLs independently Performs ADL's independently Motivation for treatment  Please see past updates related to this goal by clicking on the "Past Updates" button in the selected goal         Follow Up Plan: Client will contact this Education officer, museum with any additional community resource needs      [place], [place] Worker  [facility] [phone]

## 2022-05-26 ENCOUNTER — Inpatient Hospital Stay (HOSPITAL_BASED_OUTPATIENT_CLINIC_OR_DEPARTMENT_OTHER): Payer: Medicare Other | Admitting: Oncology

## 2022-05-26 ENCOUNTER — Inpatient Hospital Stay: Payer: Medicare Other | Attending: Oncology

## 2022-05-26 ENCOUNTER — Encounter: Payer: Self-pay | Admitting: Oncology

## 2022-05-26 ENCOUNTER — Inpatient Hospital Stay: Payer: Medicare Other

## 2022-05-26 VITALS — BP 109/80 | HR 92 | Temp 97.8°F | Resp 18 | Ht 59.0 in | Wt 142.6 lb

## 2022-05-26 DIAGNOSIS — Z7984 Long term (current) use of oral hypoglycemic drugs: Secondary | ICD-10-CM | POA: Diagnosis not present

## 2022-05-26 DIAGNOSIS — C20 Malignant neoplasm of rectum: Secondary | ICD-10-CM | POA: Diagnosis present

## 2022-05-26 DIAGNOSIS — Z7189 Other specified counseling: Secondary | ICD-10-CM

## 2022-05-26 DIAGNOSIS — E876 Hypokalemia: Secondary | ICD-10-CM | POA: Insufficient documentation

## 2022-05-26 DIAGNOSIS — F319 Bipolar disorder, unspecified: Secondary | ICD-10-CM | POA: Diagnosis not present

## 2022-05-26 DIAGNOSIS — R7989 Other specified abnormal findings of blood chemistry: Secondary | ICD-10-CM | POA: Diagnosis not present

## 2022-05-26 DIAGNOSIS — E1122 Type 2 diabetes mellitus with diabetic chronic kidney disease: Secondary | ICD-10-CM | POA: Insufficient documentation

## 2022-05-26 DIAGNOSIS — Z5111 Encounter for antineoplastic chemotherapy: Secondary | ICD-10-CM | POA: Insufficient documentation

## 2022-05-26 DIAGNOSIS — Z720 Tobacco use: Secondary | ICD-10-CM | POA: Insufficient documentation

## 2022-05-26 DIAGNOSIS — D72829 Elevated white blood cell count, unspecified: Secondary | ICD-10-CM | POA: Insufficient documentation

## 2022-05-26 DIAGNOSIS — F17210 Nicotine dependence, cigarettes, uncomplicated: Secondary | ICD-10-CM | POA: Insufficient documentation

## 2022-05-26 DIAGNOSIS — D6959 Other secondary thrombocytopenia: Secondary | ICD-10-CM | POA: Insufficient documentation

## 2022-05-26 DIAGNOSIS — F209 Schizophrenia, unspecified: Secondary | ICD-10-CM | POA: Diagnosis not present

## 2022-05-26 DIAGNOSIS — Z79899 Other long term (current) drug therapy: Secondary | ICD-10-CM | POA: Insufficient documentation

## 2022-05-26 DIAGNOSIS — T451X5A Adverse effect of antineoplastic and immunosuppressive drugs, initial encounter: Secondary | ICD-10-CM | POA: Diagnosis not present

## 2022-05-26 LAB — COMPREHENSIVE METABOLIC PANEL
ALT: 16 U/L (ref 0–44)
AST: 20 U/L (ref 15–41)
Albumin: 3.7 g/dL (ref 3.5–5.0)
Alkaline Phosphatase: 104 U/L (ref 38–126)
Anion gap: 6 (ref 5–15)
BUN: 17 mg/dL (ref 6–20)
CO2: 27 mmol/L (ref 22–32)
Calcium: 8.8 mg/dL — ABNORMAL LOW (ref 8.9–10.3)
Chloride: 101 mmol/L (ref 98–111)
Creatinine, Ser: 0.9 mg/dL (ref 0.44–1.00)
GFR, Estimated: >60 mL/min (ref >60)
Glucose, Bld: 277 mg/dL — ABNORMAL HIGH (ref 70–99)
Potassium: 4.2 mmol/L (ref 3.5–5.1)
Sodium: 134 mmol/L — ABNORMAL LOW (ref 135–145)
Total Bilirubin: 0.3 mg/dL (ref 0.3–1.2)
Total Protein: 7.2 g/dL (ref 6.5–8.1)

## 2022-05-26 LAB — CBC WITH DIFFERENTIAL/PLATELET
Abs Immature Granulocytes: 0.04 10*3/uL (ref 0.00–0.07)
Basophils Absolute: 0.1 10*3/uL (ref 0.0–0.1)
Basophils Relative: 1 %
Eosinophils Absolute: 0.2 10*3/uL (ref 0.0–0.5)
Eosinophils Relative: 2 %
HCT: 43.7 % (ref 36.0–46.0)
Hemoglobin: 14.5 g/dL (ref 12.0–15.0)
Immature Granulocytes: 0 %
Lymphocytes Relative: 14 %
Lymphs Abs: 1.7 10*3/uL (ref 0.7–4.0)
MCH: 31.1 pg (ref 26.0–34.0)
MCHC: 33.2 g/dL (ref 30.0–36.0)
MCV: 93.8 fL (ref 80.0–100.0)
Monocytes Absolute: 0.8 10*3/uL (ref 0.1–1.0)
Monocytes Relative: 6 %
Neutro Abs: 9.4 10*3/uL — ABNORMAL HIGH (ref 1.7–7.7)
Neutrophils Relative %: 77 %
Platelets: 140 10*3/uL — ABNORMAL LOW (ref 150–400)
RBC: 4.66 MIL/uL (ref 3.87–5.11)
RDW: 12.9 % (ref 11.5–15.5)
WBC: 12.2 10*3/uL — ABNORMAL HIGH (ref 4.0–10.5)
nRBC: 0 % (ref 0.0–0.2)

## 2022-05-26 MED ORDER — SODIUM CHLORIDE 0.9 % IV SOLN
10.0000 mg | Freq: Once | INTRAVENOUS | Status: AC
  Administered 2022-05-26: 10 mg via INTRAVENOUS
  Filled 2022-05-26: qty 10
  Filled 2022-05-26: qty 1

## 2022-05-26 MED ORDER — PALONOSETRON HCL INJECTION 0.25 MG/5ML
0.2500 mg | Freq: Once | INTRAVENOUS | Status: AC
  Administered 2022-05-26: 0.25 mg via INTRAVENOUS
  Filled 2022-05-26: qty 5

## 2022-05-26 MED ORDER — FLUOROURACIL CHEMO INJECTION 2.5 GM/50ML
400.0000 mg/m2 | Freq: Once | INTRAVENOUS | Status: AC
  Administered 2022-05-26: 650 mg via INTRAVENOUS
  Filled 2022-05-26: qty 13

## 2022-05-26 MED ORDER — LEUCOVORIN CALCIUM INJECTION 350 MG
650.0000 mg | Freq: Once | INTRAVENOUS | Status: AC
  Administered 2022-05-26: 650 mg via INTRAVENOUS
  Filled 2022-05-26: qty 32.5

## 2022-05-26 MED ORDER — OXALIPLATIN CHEMO INJECTION 100 MG/20ML
85.0000 mg/m2 | Freq: Once | INTRAVENOUS | Status: AC
  Administered 2022-05-26: 140 mg via INTRAVENOUS
  Filled 2022-05-26: qty 20

## 2022-05-26 MED ORDER — DEXTROSE 5 % IV SOLN
Freq: Once | INTRAVENOUS | Status: AC
  Filled 2022-05-26: qty 250

## 2022-05-26 MED ORDER — SODIUM CHLORIDE 0.9 % IV SOLN
2400.0000 mg/m2 | INTRAVENOUS | Status: DC
  Administered 2022-05-26: 3950 mg via INTRAVENOUS
  Filled 2022-05-26: qty 79

## 2022-05-26 NOTE — Patient Instructions (Signed)
[facility] AT [place]  Discharge Instructions: Thank you for choosing [facility] to provide your oncology and hematology care.  If you have a lab appointment with the [facility], please go directly to the [facility] and check in at the registration area.  Wear comfortable clothing and clothing appropriate for easy access to any Portacath or PICC line.   We strive to give you quality time with your provider. You may need to reschedule your appointment if you arrive late (15 or more minutes).  Arriving late affects you and other patients whose appointments are after yours.  Also, if you miss three or more appointments without notifying the office, you may be dismissed from the clinic at the provider's discretion.      For prescription refill requests, have your pharmacy contact our office and allow 72 hours for refills to be completed.    Today you received the following chemotherapy and/or immunotherapy agents oxaliplatin, leucovorin, fluorouracil      To help prevent nausea and vomiting after your treatment, we encourage you to take your nausea medication as directed.  BELOW ARE SYMPTOMS THAT SHOULD BE REPORTED IMMEDIATELY: *FEVER GREATER THAN 100.4 F (38 C) OR HIGHER *CHILLS OR SWEATING *NAUSEA AND VOMITING THAT IS NOT CONTROLLED WITH YOUR NAUSEA MEDICATION *UNUSUAL SHORTNESS OF BREATH *UNUSUAL BRUISING OR BLEEDING *URINARY PROBLEMS (pain or burning when urinating, or frequent urination) *BOWEL PROBLEMS (unusual diarrhea, constipation, pain near the anus) TENDERNESS IN MOUTH AND THROAT WITH OR WITHOUT PRESENCE OF ULCERS (sore throat, sores in mouth, or a toothache) UNUSUAL RASH, SWELLING OR PAIN  UNUSUAL VAGINAL DISCHARGE OR ITCHING   Items with * indicate a potential emergency and should be followed up as soon as possible or go to the Emergency Department if any problems should occur.  Please show the CHEMOTHERAPY ALERT CARD or IMMUNOTHERAPY  ALERT CARD at check-in to the Emergency Department and triage nurse.  Should you have questions after your visit or need to cancel or reschedule your appointment, please contact [facility] [place] [facility] [place]  [phone] and follow the prompts.  Office hours are 8:00 a.m. to 4:30 p.m. Monday - Friday. Please note that voicemails left after 4:00 p.m. may not be returned until the following business day.  We are closed weekends and major holidays. You have access to a nurse at all times for urgent questions. Please call the main number to the clinic [phone] and follow the prompts.  For any non-urgent questions, you may also contact your provider using MyChart. We now offer e-Visits for anyone 26 and older to request care online for non-urgent symptoms. For details visit [email or web address].   Also download the MyChart app! Go to the app store, search "MyChart", open the app, select [facility], and log in with your MyChart username and password.  Due to Covid, a mask is required upon entering the hospital/clinic. If you do not have a mask, one will be given to you upon arrival. For doctor visits, patients may have 1 support person aged 59 or older with them. For treatment visits, patients cannot have anyone with them due to current Covid guidelines and our immunocompromised population.   Oxaliplatin Injection What is this medication? OXALIPLATIN (ox AL i PLA tin) is a chemotherapy drug. It targets fast dividing cells, like cancer cells, and causes these cells to die. This medicine is used to treat cancers of the colon and rectum, and many other cancers. This medicine may be used for other  purposes; ask your health care provider or pharmacist if you have questions. COMMON BRAND NAME(S): Eloxatin What should I tell my care team before I take this medication? They need to know if you have any of these conditions: heart disease history of irregular heartbeat liver disease low  blood counts, like white cells, platelets, or red blood cells lung or breathing disease, like asthma take medicines that treat or prevent blood clots tingling of the fingers or toes, or other nerve disorder an unusual or allergic reaction to oxaliplatin, other chemotherapy, other medicines, foods, dyes, or preservatives pregnant or trying to get pregnant breast-feeding How should I use this medication? This drug is given as an infusion into a vein. It is administered in a hospital or clinic by a specially trained health care professional. Talk to your pediatrician regarding the use of this medicine in children. Special care may be needed. Overdosage: If you think you have taken too much of this medicine contact a poison control center or emergency room at once. NOTE: This medicine is only for you. Do not share this medicine with others. What if I miss a dose? It is important not to miss a dose. Call your doctor or health care professional if you are unable to keep an appointment. What may interact with this medication? Do not take this medicine with any of the following medications: cisapride dronedarone pimozide thioridazine This medicine may also interact with the following medications: aspirin and aspirin-like medicines certain medicines that treat or prevent blood clots like warfarin, apixaban, dabigatran, and rivaroxaban cisplatin cyclosporine diuretics medicines for infection like acyclovir, adefovir, amphotericin B, bacitracin, cidofovir, foscarnet, ganciclovir, gentamicin, pentamidine, vancomycin NSAIDs, medicines for pain and inflammation, like ibuprofen or naproxen other medicines that prolong the QT interval (an abnormal heart rhythm) pamidronate zoledronic acid This list may not describe all possible interactions. Give your health care provider a list of all the medicines, herbs, non-prescription drugs, or dietary supplements you use. Also tell them if you smoke, drink  alcohol, or use illegal drugs. Some items may interact with your medicine. What should I watch for while using this medication? Your condition will be monitored carefully while you are receiving this medicine. You may need blood work done while you are taking this medicine. This medicine may make you feel generally unwell. This is not uncommon as chemotherapy can affect healthy cells as well as cancer cells. Report any side effects. Continue your course of treatment even though you feel ill unless your healthcare professional tells you to stop. This medicine can make you more sensitive to cold. Do not drink cold drinks or use ice. Cover exposed skin before coming in contact with cold temperatures or cold objects. When out in cold weather wear warm clothing and cover your mouth and nose to warm the air that goes into your lungs. Tell your doctor if you get sensitive to the cold. Do not become pregnant while taking this medicine or for 9 months after stopping it. Women should inform their health care professional if they wish to become pregnant or think they might be pregnant. Men should not father a child while taking this medicine and for 6 months after stopping it. There is potential for serious side effects to an unborn child. Talk to your health care professional for more information. Do not breast-feed a child while taking this medicine or for 3 months after stopping it. This medicine has caused ovarian failure in some women. This medicine may make it more difficult to  get pregnant. Talk to your health care professional if you are concerned about your fertility. This medicine has caused decreased sperm counts in some men. This may make it more difficult to father a child. Talk to your health care professional if you are concerned about your fertility. This medicine may increase your risk of getting an infection. Call your health care professional for advice if you get a fever, chills, or sore throat,  or other symptoms of a cold or flu. Do not treat yourself. Try to avoid being around people who are sick. Avoid taking medicines that contain aspirin, acetaminophen, ibuprofen, naproxen, or ketoprofen unless instructed by your health care professional. These medicines may hide a fever. Be careful brushing or flossing your teeth or using a toothpick because you may get an infection or bleed more easily. If you have any dental work done, tell your dentist you are receiving this medicine. What side effects may I notice from receiving this medication? Side effects that you should report to your doctor or health care professional as soon as possible: allergic reactions like skin rash, itching or hives, swelling of the face, lips, or tongue breathing problems cough low blood counts - this medicine may decrease the number of white blood cells, red blood cells, and platelets. You may be at increased risk for infections and bleeding nausea, vomiting pain, redness, or irritation at site where injected pain, tingling, numbness in the hands or feet signs and symptoms of bleeding such as bloody or black, tarry stools; red or dark brown urine; spitting up blood or brown material that looks like coffee grounds; red spots on the skin; unusual bruising or bleeding from the eyes, gums, or nose signs and symptoms of a dangerous change in heartbeat or heart rhythm like chest pain; dizziness; fast, irregular heartbeat; palpitations; feeling faint or lightheaded; falls signs and symptoms of infection like fever; chills; cough; sore throat; pain or trouble passing urine signs and symptoms of liver injury like dark yellow or brown urine; general ill feeling or flu-like symptoms; light-colored stools; loss of appetite; nausea; right upper belly pain; unusually weak or tired; yellowing of the eyes or skin signs and symptoms of low red blood cells or anemia such as unusually weak or tired; feeling faint or lightheaded;  falls signs and symptoms of muscle injury like dark urine; trouble passing urine or change in the amount of urine; unusually weak or tired; muscle pain; back pain Side effects that usually do not require medical attention (report to your doctor or health care professional if they continue or are bothersome): changes in taste diarrhea gas hair loss loss of appetite mouth sores This list may not describe all possible side effects. Call your doctor for medical advice about side effects. You may report side effects to FDA at 1-800-FDA-1088. Where should I keep my medication? This drug is given in a hospital or clinic and will not be stored at home. NOTE: This sheet is a summary. It may not cover all possible information. If you have questions about this medicine, talk to your doctor, pharmacist, or health care provider.  2023 Elsevier/Gold Standard (2021-11-11 00:00:00)  Leucovorin injection What is this medication? LEUCOVORIN (loo koe VOR in) is used to prevent or treat the harmful effects of some medicines. This medicine is used to treat anemia caused by a low amount of folic acid in the body. It is also used with 5-fluorouracil (5-FU) to treat colon cancer. This medicine may be used for other purposes; ask  your health care provider or pharmacist if you have questions. What should I tell my care team before I take this medication? They need to know if you have any of these conditions: anemia from low levels of vitamin B-12 in the blood an unusual or allergic reaction to leucovorin, folic acid, other medicines, foods, dyes, or preservatives pregnant or trying to get pregnant breast-feeding How should I use this medication? This medicine is for injection into a muscle or into a vein. It is given by a health care professional in a hospital or clinic setting. Talk to your pediatrician regarding the use of this medicine in children. Special care may be needed. Overdosage: If you think you have  taken too much of this medicine contact a poison control center or emergency room at once. NOTE: This medicine is only for you. Do not share this medicine with others. What if I miss a dose? This does not apply. What may interact with this medication? capecitabine fluorouracil phenobarbital phenytoin primidone trimethoprim-sulfamethoxazole This list may not describe all possible interactions. Give your health care provider a list of all the medicines, herbs, non-prescription drugs, or dietary supplements you use. Also tell them if you smoke, drink alcohol, or use illegal drugs. Some items may interact with your medicine. What should I watch for while using this medication? Your condition will be monitored carefully while you are receiving this medicine. This medicine may increase the side effects of 5-fluorouracil, 5-FU. Tell your doctor or health care professional if you have diarrhea or mouth sores that do not get better or that get worse. What side effects may I notice from receiving this medication? Side effects that you should report to your doctor or health care professional as soon as possible: allergic reactions like skin rash, itching or hives, swelling of the face, lips, or tongue breathing problems fever, infection mouth sores unusual bleeding or bruising unusually weak or tired Side effects that usually do not require medical attention (report to your doctor or health care professional if they continue or are bothersome): constipation or diarrhea loss of appetite nausea, vomiting This list may not describe all possible side effects. Call your doctor for medical advice about side effects. You may report side effects to FDA at 1-800-FDA-1088. Where should I keep my medication? This drug is given in a hospital or clinic and will not be stored at home. NOTE: This sheet is a summary. It may not cover all possible information. If you have questions about this medicine, talk to  your doctor, pharmacist, or health care provider.  2023 Elsevier/Gold Standard (2008-06-18 00:00:00)  Fluorouracil, 5-FU injection What is this medication? FLUOROURACIL, 5-FU (flure oh YOOR a sil) is a chemotherapy drug. It slows the growth of cancer cells. This medicine is used to treat many types of cancer like breast cancer, colon or rectal cancer, pancreatic cancer, and stomach cancer. This medicine may be used for other purposes; ask your health care provider or pharmacist if you have questions. COMMON BRAND NAME(S): Adrucil What should I tell my care team before I take this medication? They need to know if you have any of these conditions: blood disorders dihydropyrimidine dehydrogenase (DPD) deficiency infection (especially a virus infection such as chickenpox, cold sores, or herpes) kidney disease liver disease malnourished, poor nutrition recent or ongoing radiation therapy an unusual or allergic reaction to fluorouracil, other chemotherapy, other medicines, foods, dyes, or preservatives pregnant or trying to get pregnant breast-feeding How should I use this medication? This drug is  given as an infusion or injection into a vein. It is administered in a hospital or clinic by a specially trained health care professional. Talk to your pediatrician regarding the use of this medicine in children. Special care may be needed. Overdosage: If you think you have taken too much of this medicine contact a poison control center or emergency room at once. NOTE: This medicine is only for you. Do not share this medicine with others. What if I miss a dose? It is important not to miss your dose. Call your doctor or health care professional if you are unable to keep an appointment. What may interact with this medication? Do not take this medicine with any of the following medications: live virus vaccines This medicine may also interact with the following medications: medicines that treat or  prevent blood clots like warfarin, enoxaparin, and dalteparin This list may not describe all possible interactions. Give your health care provider a list of all the medicines, herbs, non-prescription drugs, or dietary supplements you use. Also tell them if you smoke, drink alcohol, or use illegal drugs. Some items may interact with your medicine. What should I watch for while using this medication? Visit your doctor for checks on your progress. This drug may make you feel generally unwell. This is not uncommon, as chemotherapy can affect healthy cells as well as cancer cells. Report any side effects. Continue your course of treatment even though you feel ill unless your doctor tells you to stop. In some cases, you may be given additional medicines to help with side effects. Follow all directions for their use. Call your doctor or health care professional for advice if you get a fever, chills or sore throat, or other symptoms of a cold or flu. Do not treat yourself. This drug decreases your body's ability to fight infections. Try to avoid being around people who are sick. This medicine may increase your risk to bruise or bleed. Call your doctor or health care professional if you notice any unusual bleeding. Be careful brushing and flossing your teeth or using a toothpick because you may get an infection or bleed more easily. If you have any dental work done, tell your dentist you are receiving this medicine. Avoid taking products that contain aspirin, acetaminophen, ibuprofen, naproxen, or ketoprofen unless instructed by your doctor. These medicines may hide a fever. Do not become pregnant while taking this medicine. Women should inform their doctor if they wish to become pregnant or think they might be pregnant. There is a potential for serious side effects to an unborn child. Talk to your health care professional or pharmacist for more information. Do not breast-feed an infant while taking this  medicine. Men should inform their doctor if they wish to father a child. This medicine may lower sperm counts. Do not treat diarrhea with over the counter products. Contact your doctor if you have diarrhea that lasts more than 2 days or if it is severe and watery. This medicine can make you more sensitive to the sun. Keep out of the sun. If you cannot avoid being in the sun, wear protective clothing and use sunscreen. Do not use sun lamps or tanning beds/booths. What side effects may I notice from receiving this medication? Side effects that you should report to your doctor or health care professional as soon as possible: allergic reactions like skin rash, itching or hives, swelling of the face, lips, or tongue low blood counts - this medicine may decrease the number of white  blood cells, red blood cells and platelets. You may be at increased risk for infections and bleeding. signs of infection - fever or chills, cough, sore throat, pain or difficulty passing urine signs of decreased platelets or bleeding - bruising, pinpoint red spots on the skin, black, tarry stools, blood in the urine signs of decreased red blood cells - unusually weak or tired, fainting spells, lightheadedness breathing problems changes in vision chest pain mouth sores nausea and vomiting pain, swelling, redness at site where injected pain, tingling, numbness in the hands or feet redness, swelling, or sores on hands or feet stomach pain unusual bleeding Side effects that usually do not require medical attention (report to your doctor or health care professional if they continue or are bothersome): changes in finger or toe nails diarrhea dry or itchy skin hair loss headache loss of appetite sensitivity of eyes to the light stomach upset unusually teary eyes This list may not describe all possible side effects. Call your doctor for medical advice about side effects. You may report side effects to FDA at  1-800-FDA-1088. Where should I keep my medication? This drug is given in a hospital or clinic and will not be stored at home. NOTE: This sheet is a summary. It may not cover all possible information. If you have questions about this medicine, talk to your doctor, pharmacist, or health care provider.  2023 Elsevier/Gold Standard (2021-11-11 00:00:00)

## 2022-05-26 NOTE — Progress Notes (Signed)
Pt here for follow up. No new concerns voiced.   

## 2022-05-26 NOTE — Progress Notes (Signed)
Hematology/Oncology Progress note [phone] [phone]            Patient Care Team: Steele Sizer, MD as PCP - General (Family Medicine) Sharmaine Base, MD as Referring Physician (Psychiatry) Bjorn Loser, MD as Consulting Physician (Urology) Germaine Pomfret, [facility] (Pharmacist) Vern Claude, [place] as Social Worker Clent Jacks, RN as Oncology Nurse Navigator  REFERRING PROVIDER: Steele Sizer, MD  CHIEF COMPLAINTS/REASON FOR VISIT:  Evaluation of rectal cancer.  HISTORY OF PRESENTING ILLNESS:   April Luna is a  51 y.o.  female with PMH listed below was seen in consultation at the request of  Steele Sizer, MD  for evaluation of rectal cancer  Patient has bipolar and schizophrenia.  She is accompanied with her psychiatry nurse.  Patient is married and lives at home with her husband and son. Patient was seen by gastroenterologist for evaluation of chronic diarrhea and abdominal bloating.  She also has a history of chronic constipation. 02/09/2022, patient had colonoscopy which showed renal mass 10 cm from anal verge.  5 mm polyp in ascending colon.  Removed and retrieved. Pathology showed rectal adenocarcinoma.  The polyp in the ascending colon is a tubular adenoma. She was referred to oncology for further evaluation and management.  Patient denies unintentional weight loss, abdominal pain, blood in the stool. Family history significant for multiple family members with cancer.  04/21/2022, patient underwent robotic assisted ultralow anterior resection. Pathology showed moderately differentiated adenocarcinoma, 4.5 cm in maximal extent, with focal extension through muscularis propria into perirectal soft tissue.  3 lymph nodes positive for metastatic carcinoma.  Negative margin.  pT3 pN1b, MSI stable. I discussed with Dr.White via secure chat and from wound healing aspect, he is ok with patient to start chemotherapy 3 weeks  after surgery. Her case was discussed on [place] tumor board .   INTERVAL HISTORY April Luna is a 51 y.o. female who has above history reviewed by me today presents for follow up visit for management of stage III rectal cancer.  Patient is here by herself today She no-show to the last appointment due to transportation issue.. She has been to chemotherapy class.  She has antiemetics medication at home.  She denies any nausea vomiting diarrhea abdominal pain today.  Review of Systems  Constitutional:  Negative for appetite change, chills, fatigue and fever.  HENT:   Negative for hearing loss and voice change.   Eyes:  Negative for eye problems.  Respiratory:  Negative for chest tightness and cough.   Cardiovascular:  Negative for chest pain.  Gastrointestinal:  Negative for abdominal distention, abdominal pain and blood in stool.  Endocrine: Negative for hot flashes.  Genitourinary:  Negative for difficulty urinating and frequency.   Musculoskeletal:  Negative for arthralgias.  Skin:  Negative for itching and rash.  Neurological:  Negative for extremity weakness.  Hematological:  Negative for adenopathy.  Psychiatric/Behavioral:  Negative for confusion.    MEDICAL HISTORY:  Past Medical History:  Diagnosis Date   Allergy    pollen   Anxiety    Arthritis    right hip   Bipolar 1 disorder (HCC)    Cancer (HCC)    rectal   Chronic kidney disease    COPD (chronic obstructive pulmonary disease) (HCC)    Depression    Family history of adverse reaction to anesthesia    grand father had a stroke during anesthesia   Family history of breast cancer    Family history  of colon cancer    Family history of uterine cancer    GERD (gastroesophageal reflux disease)    History of kidney stones    Hyperlipidemia    Hypertension    Hypothyroidism    Panic attack    Pneumonia    Psoriasis    Sleep apnea 08/11/2021   No CPAP   Type 2 diabetes mellitus with  microalbuminuria, without long-term current use of insulin ([place]) 06/24/2019    SURGICAL HISTORY: Past Surgical History:  Procedure Laterality Date   BREAST BIOPSY Left 12/14/2021   [place] bx, venus marker, path pending   CESAREAN SECTION     COLONOSCOPY WITH PROPOFOL N/A 02/09/2022   Procedure: COLONOSCOPY WITH PROPOFOL;  Surgeon: Lin Landsman, MD;  Location: [place];  Service: Endoscopy;  Laterality: N/A;  sleep apnea   CYSTOSCOPY W/ RETROGRADES Left 11/08/2018   Procedure: CYSTOSCOPY WITH RETROGRADE PYELOGRAM;  Surgeon: Billey Co, MD;  Location: [facility];  Service: Urology;  Laterality: Left;   CYSTOSCOPY/URETEROSCOPY/HOLMIUM LASER/STENT PLACEMENT Left 11/08/2018   Procedure: CYSTOSCOPY/URETEROSCOPY/HOLMIUM LASER/STENT PLACEMENT;  Surgeon: Billey Co, MD;  Location: [facility];  Service: Urology;  Laterality: Left;   IR IMAGING GUIDED PORT INSERTION  05/11/2022   MOUTH SURGERY     wisdom teeth extraction   MOUTH SURGERY     teeth removal   POLYPECTOMY N/A 02/09/2022   Procedure: POLYPECTOMY;  Surgeon: Lin Landsman, MD;  Location: [place];  Service: Endoscopy;  Laterality: N/A;   XI ROBOTIC ASSISTED LOWER ANTERIOR RESECTION N/A 04/21/2022   Procedure: XI ROBOTIC ASSISTED LOWER ANTERIOR RESECTION WITH COLOSTOMY, BILATERAL TAP BLOCK, ASSESSMENT OF TISSUE PERFUSSION WITH FIREFLY INJECTION;  Surgeon: Ileana Roup, MD;  Location: [facility];  Service: General;  Laterality: N/A;    SOCIAL HISTORY: Social History   Socioeconomic History   Marital status: Married    Spouse name: Montine Circle   Number of children: 1   Years of education: 12   Highest education level: High school graduate  Occupational History   Occupation: unemployed    Comment: disabled  Tobacco Use   Smoking status: Every Day    Packs/day: 0.25    Years: 34.00    Pack years: 8.50    Types: Cigarettes    Start date: 05/13/1985   Smokeless tobacco: Never    Tobacco comments:    1 PPD 04/20/223  Vaping Use   Vaping Use: Former   Start date: 05/25/2018   Quit date: 09/24/2018  Substance and Sexual Activity   Alcohol use: Not Currently    Alcohol/week: 4.0 standard drinks    Types: 4 Glasses of wine per week    Comment: quit ETOH in Feb. 2023   Drug use: Not Currently    Types: Marijuana    Comment: 2 days ago   Sexual activity: Not Currently    Birth control/protection: Post-menopausal  Other Topics Concern   Not on file  Social History Narrative   Lives with husband and son    Social Determinants of Health   Financial Resource Strain: Medium Risk   Difficulty of Paying Living Expenses: Somewhat hard  Food Insecurity: No Food Insecurity   Worried About Charity fundraiser in the Last Year: Never true   Arboriculturist in the Last Year: Never true  Transportation Needs: Unmet Transportation Needs   Lack of Transportation (Medical): Yes   Lack of Transportation (Non-Medical): Yes  Physical Activity: Inactive   Days of  Exercise per Week: 0 days   Minutes of Exercise per Session: 0 min  Stress: No Stress Concern Present   Feeling of Stress : Not at all  Social Connections: Moderately Integrated   Frequency of Communication with Friends and Family: More than three times a week   Frequency of Social Gatherings with Friends and Family: Twice a week   Attends Religious Services: More than 4 times per year   Active Member of [employer] or Organizations: No   Attends Music therapist: Never   Marital Status: Married  Human resources officer Violence: Not At Risk   Fear of Current or Ex-Partner: No   Emotionally Abused: No   Physically Abused: No   Sexually Abused: No    FAMILY HISTORY: Family History  Problem Relation Age of Onset   Depression Mother    Anxiety disorder Mother    Diabetes Mother    Hypertension Mother    Hyperlipidemia Mother    Cancer Mother    Uterine cancer Mother 73   Cervical cancer Mother 13   Colon  cancer Father    Depression Brother    Anxiety disorder Brother    Cancer Maternal Aunt        unk types   Diabetes Mellitus II Maternal Grandmother    Hypercholesterolemia Maternal Grandmother    Breast cancer Maternal Grandmother    Cancer Paternal Grandmother    Diabetes Paternal Grandmother    Melanoma Paternal Grandmother    Stomach cancer Paternal Grandmother     ALLERGIES:  is allergic to metformin and related, nsaids, perphenazine, sulfa antibiotics, abilify [aripiprazole], and penicillins.  MEDICATIONS:  Current Outpatient Medications  Medication Sig Dispense Refill   acetaminophen (TYLENOL) 325 MG tablet Take 650 mg by mouth every 6 (six) hours as needed for headache (pain).     albuterol (VENTOLIN HFA) 108 (90 Base) MCG/ACT inhaler Inhale 2 puffs into the lungs every 6 (six) hours as needed for wheezing or shortness of breath. 1 each 5   amantadine (SYMMETREL) 100 MG capsule Take 100 mg by mouth 2 (two) times daily.     blood glucose meter kit and supplies Dispense based on patient and insurance preference. Use up to four times daily as directed. (FOR ICD-10 E10.9, E11.9). 1 each 0   Budeson-Glycopyrrol-Formoterol (BREZTRI AEROSPHERE) 160-9-4.8 MCG/ACT AERO Inhale 2 puffs into the lungs in the morning and at bedtime. 5.9 g 11   buPROPion (WELLBUTRIN XL) 150 MG 24 hr tablet Take 150 mg by mouth every morning.     Cholecalciferol (VITAMIN D-3) 125 MCG (5000 UT) TABS Take 5,000 Units by mouth daily. 30 tablet 1   dapagliflozin propanediol (FARXIGA) 10 MG TABS tablet Take 1 tablet (10 mg total) by mouth daily before breakfast. 30 tablet 2   gabapentin (NEURONTIN) 300 MG capsule Take 300 mg by mouth 2 (two) times daily.     guaiFENesin (MUCINEX) 600 MG 12 hr tablet Take 2 tablets (1,200 mg total) by mouth 2 (two) times daily as needed for to loosen phlegm or cough.     hydrOXYzine (VISTARIL) 50 MG capsule Take 50 mg by mouth 3 (three) times daily as needed for anxiety.      icosapent Ethyl (VASCEPA) 1 g capsule Take 2 capsules (2 g total) by mouth 2 (two) times daily. 120 capsule 3   levothyroxine (SYNTHROID) 75 MCG tablet TAKE 1 TABLET BY MOUTH DAILY BEFORE BREAKFAST 90 tablet 0   lidocaine-prilocaine (EMLA) cream Apply to affected area once 30 g  3   linaclotide (LINZESS) 145 MCG CAPS capsule TAKE 1 CAPSULE BY MOUTH DAILY BEFORE BREAKFAST 30 capsule 1   loratadine (CLARITIN) 10 MG tablet Take 10 mg by mouth every morning.     losartan (COZAAR) 25 MG tablet Take 1 tablet (25 mg total) by mouth daily. 30 tablet 3   NON FORMULARY Pt uses a cpap nightly     ondansetron (ZOFRAN) 8 MG tablet Take 1 tablet (8 mg total) by mouth 2 (two) times daily as needed for refractory nausea / vomiting. Start on day 3 after chemotherapy. 30 tablet 1   paliperidone (INVEGA SUSTENNA) 234 MG/1.5ML SUSY injection Inject 234 mg into the muscle every 30 (thirty) days. On or about the 14th of each month     pantoprazole (PROTONIX) 40 MG tablet Take 1 tablet (40 mg total) by mouth daily with breakfast. 90 tablet 0   prochlorperazine (COMPAZINE) 10 MG tablet Take 1 tablet (10 mg total) by mouth every 6 (six) hours as needed for nausea or vomiting. 60 tablet 3   rosuvastatin (CRESTOR) 40 MG tablet Take 1 tablet (40 mg total) by mouth daily. (Patient taking differently: Take 40 mg by mouth every morning.) 30 tablet 3   sertraline (ZOLOFT) 100 MG tablet Take 200 mg by mouth every morning.     Spacer/Aero-Holding Chambers (AEROCHAMBER MV) inhaler Use as instructed 1 each 0   nicotine (NICODERM CQ - DOSED IN MG/24 HR) 7 mg/24hr patch Place 1 patch (7 mg total) onto the skin daily. (Patient not taking: Reported on 04/21/2022) 28 patch 1   No current facility-administered medications for this visit.   Facility-Administered Medications Ordered in Other Visits  Medication Dose Route Frequency Provider Last Rate Last Admin   fluorouracil (ADRUCIL) 3,950 mg in sodium chloride 0.9 % 71 mL chemo infusion   2,400 mg/m2 (Treatment Plan Recorded) Intravenous 1 day or 1 dose [name], MD   3,950 mg at 05/26/22 1320     PHYSICAL EXAMINATION: ECOG PERFORMANCE STATUS: 0 - Asymptomatic Vitals:   05/26/22 0915  BP: 109/80  Pulse: 92  Resp: 18  Temp: 97.8 F (36.6 C)   Filed Weights   05/26/22 0915  Weight: 142 lb 9.6 oz (64.7 kg)    Physical Exam Constitutional:      General: She is not in acute distress. HENT:     Head: Normocephalic and atraumatic.  Eyes:     General: No scleral icterus. Cardiovascular:     Rate and Rhythm: Normal rate and regular rhythm.     Heart sounds: Normal heart sounds.  Pulmonary:     Effort: Pulmonary effort is normal. No respiratory distress.     Breath sounds: No wheezing.  Abdominal:     General: Bowel sounds are normal. There is no distension.     Palpations: Abdomen is soft.  Musculoskeletal:        General: No deformity. Normal range of motion.     Cervical back: Normal range of motion and neck supple.  Skin:    General: Skin is warm and dry.     Findings: No erythema or rash.  Neurological:     Mental Status: She is alert and oriented to person, place, and time. Mental status is at baseline.     Cranial Nerves: No cranial nerve deficit.     Coordination: Coordination normal.  Psychiatric:        Mood and Affect: Mood normal.    LABORATORY DATA:  I have reviewed the data  as listed Lab Results  Component Value Date   WBC 12.2 (H) 05/26/2022   HGB 14.5 05/26/2022   HCT 43.7 05/26/2022   MCV 93.8 05/26/2022   PLT 140 (L) 05/26/2022   Recent Labs    06/20/21 1234 08/11/21 1031 02/17/22 1310 04/21/22 0632 04/22/22 0355 04/23/22 0358 05/26/22 0841  NA 139 140 137   < > 143 144 134*  K 3.8 3.8 4.4   < > 3.9 3.8 4.2  CL 99 102 97*   < > 109 107 101  CO2 31 29 33*   < > _0 GLUCOSE 91 205* 97   < > 115* 135* 277*  BUN 10 6* 12   < > _1 CREATININE 0.97 0.94 1.11*   < > 0.87 0.76 0.90  CALCIUM 10.7* 8.9 9.8   < > 9.2  9.0 8.8*  GFRNONAA 69  --  >60   < > >60 >60 >60  GFRAA 80  --   --   --   --   --   --   PROT 6.9 6.6 8.1  --   --   --  7.2  ALBUMIN  --   --  4.4  --   --   --  3.7  AST _2 --   --   --  20  ALT _3 --   --   --  16  ALKPHOS  --   --  116  --   --   --  104  BILITOT 0.5 0.2 0.2*  --   --   --  0.3   < > = values in this interval not displayed.    Iron/TIBC/Ferritin/ %Sat No results found for: IRON, TIBC, FERRITIN, IRONPCTSAT    RADIOGRAPHIC STUDIES: I have personally reviewed the radiological images as listed and agreed with the findings in the report. IR IMAGING GUIDED PORT INSERTION  Result Date: 05/11/2022 INDICATION: Rectal cancer, chemotherapy access EXAM: Ultrasound-guided puncture of the right internal jugular vein Placement of a right-sided chest port using fluoroscopic guidance MEDICATIONS: Per EMR ANESTHESIA/SEDATION: Moderate (conscious) sedation was employed during this procedure. A total of Versed 1 mg and Fentanyl 50 mcg was administered intravenously. Moderate Sedation Time: 15 minutes. The patient's level of consciousness and vital signs were monitored continuously by radiology nursing throughout the procedure under my direct supervision. FLUOROSCOPY TIME:  Fluoroscopy Time: 0.5 minutes (3 mGy) COMPLICATIONS: None immediate. PROCEDURE: Informed written consent was obtained from the patient after a thorough discussion of the procedural risks, benefits and alternatives. All questions were addressed. Maximal Sterile Barrier Technique was utilized including caps, mask, sterile gowns, sterile gloves, sterile drape, hand hygiene and skin antiseptic. A timeout was performed prior to the initiation of the procedure. The patient was placed supine on the exam table. The right neck and chest was prepped and draped in the standard sterile fashion. A preliminary ultrasound of the right neck was performed and demonstrates a patent right internal jugular vein. A permanent  ultrasound image was stored in the electronic medical record. The overlying skin was anesthetized with 1% Lidocaine. Using ultrasound guidance, access was obtained into the right internal jugular vein using a 21 gauge micropuncture set. A wire was advanced into the SVC, a short incision was made at the puncture site, and serial dilatation performed. Next, in an ipsilateral infraclavicular location, an incision was made at the site of the subcutaneous reservoir. Blunt dissection was  used to open a pocket to contain the reservoir. A subcutaneous tunnel was then created from the port site to the puncture site. A(n) 8 Fr single lumen catheter was advanced through the tunnel. The catheter was attached to the port and this was placed in the subcutaneous pocket. Under fluoroscopic guidance, a peel away sheath was placed, and the catheter was trimmed to the appropriate length and was advanced into the central veins. The catheter length is 22 cm. The tip of the catheter lies near the superior cavoatrial junction. The port flushes and aspirates appropriately. The port was flushed and locked with heparinized saline. The port pocket was closed in 2 layers using 3-0 and 4-0 Vicryl/absorbable suture. Dermabond was also applied to both incisions. The patient tolerated the procedure well and was transferred to recovery in stable condition. IMPRESSION: Successful placement of a right-sided chest port via the right internal jugular vein. The port is ready for immediate use. Electronically Signed   By: Albin Felling M.D.   On: 05/11/2022 15:33      ASSESSMENT & PLAN:  1. Rectal cancer ([place])   2. Tobacco use   3. Encounter for antineoplastic chemotherapy    Cancer Staging  Rectal cancer [facility]) Staging form: Colon and Rectum, AJCC 8th Edition - Pathologic stage from 05/03/2022: Stage IIIB (pT3, pN1b, cM0) - Signed by [name], MD on 05/03/2022   #Stage III rectal adenocarcinoma. pT3 pN1b cM0 Status post APR. Labs reviewed  and discussed with patient Proceed with cycle 1 FOLFOX.  She will return on day 4 for pump discontinuation We reviewed details about antiemetics instructions today again.  #Leukocytosis, mild.  Likely reactive.  Chronic smoking.  #Tobacco use,  recommend smoke cessation.  All questions were answered. The patient knows to call the clinic with any problems questions or concerns.  cc Steele Sizer, MD    Return of visit:  Treatment today. D4 pump dc  1 week Lab NP +/- IVF 2 week lab MD FOLFOX D4 pump dc    [name], MD, PhD [facility] Hematology Oncology 05/26/2022

## 2022-05-29 ENCOUNTER — Other Ambulatory Visit: Payer: Medicare Other

## 2022-05-29 ENCOUNTER — Inpatient Hospital Stay: Payer: Medicare Other

## 2022-05-29 ENCOUNTER — Telehealth: Payer: Medicare Other

## 2022-05-29 ENCOUNTER — Ambulatory Visit: Payer: Medicare Other | Admitting: Oncology

## 2022-05-29 ENCOUNTER — Telehealth: Payer: Self-pay

## 2022-05-29 ENCOUNTER — Ambulatory Visit: Payer: Medicare Other

## 2022-05-29 VITALS — BP 118/63 | HR 90 | Resp 18

## 2022-05-29 DIAGNOSIS — Z5111 Encounter for antineoplastic chemotherapy: Secondary | ICD-10-CM | POA: Diagnosis not present

## 2022-05-29 DIAGNOSIS — C20 Malignant neoplasm of rectum: Secondary | ICD-10-CM

## 2022-05-29 MED ORDER — SODIUM CHLORIDE 0.9% FLUSH
10.0000 mL | INTRAVENOUS | Status: DC | PRN
  Administered 2022-05-29: 10 mL
  Filled 2022-05-29: qty 10

## 2022-05-29 MED ORDER — HEPARIN SOD (PORK) LOCK FLUSH 100 UNIT/ML IV SOLN
500.0000 [IU] | Freq: Once | INTRAVENOUS | Status: AC | PRN
  Administered 2022-05-29: 500 [IU]
  Filled 2022-05-29: qty 5

## 2022-05-29 NOTE — Telephone Encounter (Signed)
T/C to patient for follow up after receiving first chemo but no answer and unable to leave voice mail.  Patient here today for pump removal.

## 2022-06-02 ENCOUNTER — Inpatient Hospital Stay: Payer: Medicare Other

## 2022-06-02 ENCOUNTER — Encounter: Payer: Self-pay | Admitting: Nurse Practitioner

## 2022-06-02 ENCOUNTER — Inpatient Hospital Stay (HOSPITAL_BASED_OUTPATIENT_CLINIC_OR_DEPARTMENT_OTHER): Payer: Medicare Other | Admitting: Nurse Practitioner

## 2022-06-02 ENCOUNTER — Telehealth: Payer: Self-pay | Admitting: *Deleted

## 2022-06-02 VITALS — BP 125/87 | HR 87 | Temp 96.0°F | Resp 16 | Ht 59.0 in | Wt 142.0 lb

## 2022-06-02 DIAGNOSIS — J020 Streptococcal pharyngitis: Secondary | ICD-10-CM

## 2022-06-02 DIAGNOSIS — C20 Malignant neoplasm of rectum: Secondary | ICD-10-CM

## 2022-06-02 DIAGNOSIS — Z5111 Encounter for antineoplastic chemotherapy: Secondary | ICD-10-CM | POA: Diagnosis not present

## 2022-06-02 DIAGNOSIS — Z7189 Other specified counseling: Secondary | ICD-10-CM

## 2022-06-02 DIAGNOSIS — K209 Esophagitis, unspecified without bleeding: Secondary | ICD-10-CM

## 2022-06-02 LAB — CBC WITH DIFFERENTIAL/PLATELET
Abs Immature Granulocytes: 0.02 10*3/uL (ref 0.00–0.07)
Basophils Absolute: 0 10*3/uL (ref 0.0–0.1)
Basophils Relative: 0 %
Eosinophils Absolute: 0.5 10*3/uL (ref 0.0–0.5)
Eosinophils Relative: 5 %
HCT: 42.4 % (ref 36.0–46.0)
Hemoglobin: 14.2 g/dL (ref 12.0–15.0)
Immature Granulocytes: 0 %
Lymphocytes Relative: 18 %
Lymphs Abs: 1.7 10*3/uL (ref 0.7–4.0)
MCH: 31.2 pg (ref 26.0–34.0)
MCHC: 33.5 g/dL (ref 30.0–36.0)
MCV: 93.2 fL (ref 80.0–100.0)
Monocytes Absolute: 0.4 10*3/uL (ref 0.1–1.0)
Monocytes Relative: 4 %
Neutro Abs: 6.8 10*3/uL (ref 1.7–7.7)
Neutrophils Relative %: 73 %
Platelets: 143 10*3/uL — ABNORMAL LOW (ref 150–400)
RBC: 4.55 MIL/uL (ref 3.87–5.11)
RDW: 12.5 % (ref 11.5–15.5)
WBC: 9.5 10*3/uL (ref 4.0–10.5)
nRBC: 0 % (ref 0.0–0.2)

## 2022-06-02 LAB — COMPREHENSIVE METABOLIC PANEL
ALT: 14 U/L (ref 0–44)
AST: 18 U/L (ref 15–41)
Albumin: 3.8 g/dL (ref 3.5–5.0)
Alkaline Phosphatase: 87 U/L (ref 38–126)
Anion gap: 9 (ref 5–15)
BUN: 18 mg/dL (ref 6–20)
CO2: 26 mmol/L (ref 22–32)
Calcium: 8.9 mg/dL (ref 8.9–10.3)
Chloride: 101 mmol/L (ref 98–111)
Creatinine, Ser: 0.96 mg/dL (ref 0.44–1.00)
GFR, Estimated: >60 mL/min (ref >60)
Glucose, Bld: 230 mg/dL — ABNORMAL HIGH (ref 70–99)
Potassium: 4.1 mmol/L (ref 3.5–5.1)
Sodium: 136 mmol/L (ref 135–145)
Total Bilirubin: 0.2 mg/dL — ABNORMAL LOW (ref 0.3–1.2)
Total Protein: 7 g/dL (ref 6.5–8.1)

## 2022-06-02 MED ORDER — HEPARIN SOD (PORK) LOCK FLUSH 100 UNIT/ML IV SOLN
500.0000 [IU] | Freq: Once | INTRAVENOUS | Status: AC | PRN
  Filled 2022-06-02: qty 5

## 2022-06-02 MED ORDER — NYSTATIN 100000 UNIT/ML MT SUSP: 5.0000 mL | Freq: Four times a day (QID) | OROMUCOSAL | 0 refills | Status: DC

## 2022-06-02 MED ORDER — SODIUM CHLORIDE 0.9% FLUSH
10.0000 mL | INTRAVENOUS | Status: DC | PRN
  Administered 2022-06-02: 10 mL via INTRAVENOUS
  Filled 2022-06-02: qty 10

## 2022-06-02 MED ORDER — AMOXICILLIN-POT CLAVULANATE 875-125 MG PO TABS: 1.0000 | ORAL_TABLET | Freq: Two times a day (BID) | ORAL | 0 refills | Status: DC

## 2022-06-02 MED ORDER — SODIUM CHLORIDE 0.9 % IV SOLN
Freq: Once | INTRAVENOUS | Status: AC
  Filled 2022-06-02: qty 250

## 2022-06-02 MED ORDER — HEPARIN SOD (PORK) LOCK FLUSH 100 UNIT/ML IV SOLN
500.0000 [IU] | Freq: Once | INTRAVENOUS | Status: DC
  Filled 2022-06-02: qty 5

## 2022-06-02 MED ORDER — HEPARIN SOD (PORK) LOCK FLUSH 100 UNIT/ML IV SOLN
INTRAVENOUS | Status: AC
  Administered 2022-06-02: 500 [IU]
  Filled 2022-06-02: qty 5

## 2022-06-02 NOTE — Progress Notes (Signed)
Hematology/Oncology Progress Note [phone] [phone]   Patient Care Team: Steele Sizer, MD as PCP - General (Family Medicine) Sharmaine Base, MD as Referring Physician (Psychiatry) Bjorn Loser, MD as Consulting Physician (Urology) Germaine Pomfret, [facility] (Pharmacist) Vern Claude, [place] as Social Worker Clent Jacks, RN as Oncology Nurse Navigator  REFERRING PROVIDER: Steele Sizer, MD   CHIEF COMPLAINTS/REASON FOR VISIT:  Evaluation of rectal cancer.  HISTORY OF PRESENTING ILLNESS:   April Luna is a  51 y.o.  female with PMH listed below was seen in consultation at the request of  Steele Sizer, MD  for evaluation of rectal cancer  Patient has bipolar and schizophrenia.  She is accompanied with her psychiatry nurse.  Patient is married and lives at home with her husband and son. Patient was seen by gastroenterologist for evaluation of chronic diarrhea and abdominal bloating.  She also has a history of chronic constipation. 02/09/2022, patient had colonoscopy which showed renal mass 10 cm from anal verge.  5 mm polyp in ascending colon.  Removed and retrieved. Pathology showed rectal adenocarcinoma.  The polyp in the ascending colon is a tubular adenoma. She was referred to oncology for further evaluation and management.  Patient denies unintentional weight loss, abdominal pain, blood in the stool. Family history significant for multiple family members with cancer.  04/21/2022, patient underwent robotic assisted ultralow anterior resection. Pathology showed moderately differentiated adenocarcinoma, 4.5 cm in maximal extent, with focal extension through muscularis propria into perirectal soft tissue.  3 lymph nodes positive for metastatic carcinoma.  Negative margin.  pT3 pN1b, MSI stable. I discussed with Dr.White via secure chat and from wound healing aspect, he is ok with patient to start chemotherapy 3 weeks after surgery.  Her case was discussed on [place] tumor board .   INTERVAL HISTORY April Luna is a 51 y.o. female who has above history reviewed by me today presents for follow up for stage III rectal cancer. She complains of sore throat, tightness and swelling. Believes she has strep throat. Mild nausea. Not eating and drinking d/t pain. This is a new problem. Denievomiting, diarhea, or pain.   Review of Systems  Constitutional:  Positive for fatigue. Negative for appetite change, chills and fever.  HENT:   Positive for sore throat and trouble swallowing. Negative for hearing loss and voice change.   Eyes:  Negative for eye problems.  Respiratory:  Negative for chest tightness and cough.   Cardiovascular:  Negative for chest pain.  Gastrointestinal:  Negative for abdominal distention, abdominal pain and blood in stool.  Endocrine: Negative for hot flashes.  Genitourinary:  Negative for difficulty urinating and frequency.   Musculoskeletal:  Negative for arthralgias.  Skin:  Negative for itching and rash.  Neurological:  Negative for extremity weakness.  Hematological:  Positive for adenopathy.  Psychiatric/Behavioral:  Negative for confusion. The patient is nervous/anxious.     MEDICAL HISTORY:  Past Medical History:  Diagnosis Date   Allergy    pollen   Anxiety    Arthritis    right hip   Bipolar 1 disorder (HCC)    Cancer (HCC)    rectal   Chronic kidney disease    COPD (chronic obstructive pulmonary disease) (HCC)    Depression    Family history of adverse reaction to anesthesia    grand father had a stroke during anesthesia   Family history of breast cancer    Family history of colon cancer  Family history of uterine cancer    GERD (gastroesophageal reflux disease)    History of kidney stones    Hyperlipidemia    Hypertension    Hypothyroidism    Panic attack    Pneumonia    Psoriasis    Sleep apnea 08/11/2021   No CPAP   Type 2 diabetes mellitus with  microalbuminuria, without long-term current use of insulin (HCC) 06/24/2019    SURGICAL HISTORY: Past Surgical History:  Procedure Laterality Date   BREAST BIOPSY Left 12/14/2021   us bx, venus marker, path pending   CESAREAN SECTION     COLONOSCOPY WITH PROPOFOL N/A 02/09/2022   Procedure: COLONOSCOPY WITH PROPOFOL;  Surgeon: Vanga, Rohini Reddy, MD;  Location: [facility];  Service: Endoscopy;  Laterality: N/A;  sleep apnea   CYSTOSCOPY W/ RETROGRADES Left 11/08/2018   Procedure: CYSTOSCOPY WITH RETROGRADE PYELOGRAM;  Surgeon: Sninsky, Brian C, MD;  Location: [facility];  Service: Urology;  Laterality: Left;   CYSTOSCOPY/URETEROSCOPY/HOLMIUM LASER/STENT PLACEMENT Left 11/08/2018   Procedure: CYSTOSCOPY/URETEROSCOPY/HOLMIUM LASER/STENT PLACEMENT;  Surgeon: Sninsky, Brian C, MD;  Location: [facility];  Service: Urology;  Laterality: Left;   IR IMAGING GUIDED PORT INSERTION  05/11/2022   MOUTH SURGERY     wisdom teeth extraction   MOUTH SURGERY     teeth removal   POLYPECTOMY N/A 02/09/2022   Procedure: POLYPECTOMY;  Surgeon: Vanga, Rohini Reddy, MD;  Location: [facility];  Service: Endoscopy;  Laterality: N/A;   XI ROBOTIC ASSISTED LOWER ANTERIOR RESECTION N/A 04/21/2022   Procedure: XI ROBOTIC ASSISTED LOWER ANTERIOR RESECTION WITH COLOSTOMY, BILATERAL TAP BLOCK, ASSESSMENT OF TISSUE PERFUSSION WITH FIREFLY INJECTION;  Surgeon: White, Christopher M, MD;  Location: [facility];  Service: General;  Laterality: N/A;    SOCIAL HISTORY: Social History   Socioeconomic History   Marital status: Married    Spouse name: Benjamin Lee Gray   Number of children: 1   Years of education: 12   Highest education level: High school graduate  Occupational History   Occupation: unemployed    Comment: disabled  Tobacco Use   Smoking status: Every Day    Packs/day: 0.25    Years: 34.00    Total pack years: 8.50    Types: Cigarettes    Start date: 05/13/1985   Smokeless tobacco: Never    Tobacco comments:    1 PPD 04/20/223  Vaping Use   Vaping Use: Former   Start date: 05/25/2018   Quit date: 09/24/2018  Substance and Sexual Activity   Alcohol use: Not Currently    Alcohol/week: 4.0 standard drinks of alcohol    Types: 4 Glasses of wine per week    Comment: quit ETOH in Feb. 2023   Drug use: Not Currently    Types: Marijuana    Comment: 2 days ago   Sexual activity: Not Currently    Birth control/protection: Post-menopausal  Other Topics Concern   Not on file  Social History Narrative   Lives with husband and son    Social Determinants of Health   Financial Resource Strain: Medium Risk (12/02/2021)   Overall Financial Resource Strain (CARDIA)    Difficulty of Paying Living Expenses: Somewhat hard  Food Insecurity: No Food Insecurity (12/02/2021)   Hunger Vital Sign    Worried About Running Out of Food in the Last Year: Never true    Ran Out of Food in the Last Year: Never true  Transportation Needs: Unmet Transportation Needs (12/02/2021)   PRAPARE - Transportation      Lack of Transportation (Medical): Yes    Lack of Transportation (Non-Medical): Yes  Physical Activity: Inactive (12/02/2021)   Exercise Vital Sign    Days of Exercise per Week: 0 days    Minutes of Exercise per Session: 0 min  Stress: No Stress Concern Present (12/02/2021)   [employer] - Occupational Stress Questionnaire    Feeling of Stress : Not at all  Social Connections: Moderately Integrated (12/02/2021)   Social Connection and Isolation Panel [NHANES]    Frequency of Communication with Friends and Family: More than three times a week    Frequency of Social Gatherings with Friends and Family: Twice a week    Attends Religious Services: More than 4 times per year    Active Member of Clubs or Organizations: No    Attends Club or Organization Meetings: Never    Marital Status: Married  Intimate Partner Violence: Not At Risk (07/07/2021)   Humiliation, Afraid,  Rape, and Kick questionnaire    Fear of Current or Ex-Partner: No    Emotionally Abused: No    Physically Abused: No    Sexually Abused: No    FAMILY HISTORY: Family History  Problem Relation Age of Onset   Depression Mother    Anxiety disorder Mother    Diabetes Mother    Hypertension Mother    Hyperlipidemia Mother    Cancer Mother    Uterine cancer Mother 50   Cervical cancer Mother 50   Colon cancer Father    Depression Brother    Anxiety disorder Brother    Cancer Maternal Aunt        unk types   Diabetes Mellitus II Maternal Grandmother    Hypercholesterolemia Maternal Grandmother    Breast cancer Maternal Grandmother    Cancer Paternal Grandmother    Diabetes Paternal Grandmother    Melanoma Paternal Grandmother    Stomach cancer Paternal Grandmother     ALLERGIES:  is allergic to metformin and related, nsaids, perphenazine, sulfa antibiotics, abilify [aripiprazole], and penicillins.  MEDICATIONS:  Current Outpatient Medications  Medication Sig Dispense Refill   acetaminophen (TYLENOL) 325 MG tablet Take 650 mg by mouth every 6 (six) hours as needed for headache (pain).     albuterol (VENTOLIN HFA) 108 (90 Base) MCG/ACT inhaler Inhale 2 puffs into the lungs every 6 (six) hours as needed for wheezing or shortness of breath. 1 each 5   amantadine (SYMMETREL) 100 MG capsule Take 100 mg by mouth 2 (two) times daily.     blood glucose meter kit and supplies Dispense based on patient and insurance preference. Use up to four times daily as directed. (FOR ICD-10 E10.9, E11.9). 1 each 0   Budeson-Glycopyrrol-Formoterol (BREZTRI AEROSPHERE) 160-9-4.8 MCG/ACT AERO Inhale 2 puffs into the lungs in the morning and at bedtime. 5.9 g 11   buPROPion (WELLBUTRIN XL) 150 MG 24 hr tablet Take 150 mg by mouth every morning.     Cholecalciferol (VITAMIN D-3) 125 MCG (5000 UT) TABS Take 5,000 Units by mouth daily. 30 tablet 1   dapagliflozin propanediol (FARXIGA) 10 MG TABS tablet Take 1  tablet (10 mg total) by mouth daily before breakfast. 30 tablet 2   gabapentin (NEURONTIN) 300 MG capsule Take 300 mg by mouth 2 (two) times daily.     guaiFENesin (MUCINEX) 600 MG 12 hr tablet Take 2 tablets (1,200 mg total) by mouth 2 (two) times daily as needed for to loosen phlegm or cough.     hydrOXYzine (VISTARIL) 50   MG capsule Take 50 mg by mouth 3 (three) times daily as needed for anxiety.     icosapent Ethyl (VASCEPA) 1 g capsule Take 2 capsules (2 g total) by mouth 2 (two) times daily. 120 capsule 3   levothyroxine (SYNTHROID) 75 MCG tablet TAKE 1 TABLET BY MOUTH DAILY BEFORE BREAKFAST 90 tablet 0   lidocaine-prilocaine (EMLA) cream Apply to affected area once 30 g 3   linaclotide (LINZESS) 145 MCG CAPS capsule TAKE 1 CAPSULE BY MOUTH DAILY BEFORE BREAKFAST 30 capsule 1   loratadine (CLARITIN) 10 MG tablet Take 10 mg by mouth every morning.     losartan (COZAAR) 25 MG tablet Take 1 tablet (25 mg total) by mouth daily. 30 tablet 3   NON FORMULARY Pt uses a cpap nightly     ondansetron (ZOFRAN) 8 MG tablet Take 1 tablet (8 mg total) by mouth 2 (two) times daily as needed for refractory nausea / vomiting. Start on day 3 after chemotherapy. 30 tablet 1   paliperidone (INVEGA SUSTENNA) 234 MG/1.5ML SUSY injection Inject 234 mg into the muscle every 30 (thirty) days. On or about the 14th of each month     pantoprazole (PROTONIX) 40 MG tablet Take 1 tablet (40 mg total) by mouth daily with breakfast. 90 tablet 0   prochlorperazine (COMPAZINE) 10 MG tablet Take 1 tablet (10 mg total) by mouth every 6 (six) hours as needed for nausea or vomiting. 60 tablet 3   rosuvastatin (CRESTOR) 40 MG tablet Take 1 tablet (40 mg total) by mouth daily. (Patient taking differently: Take 40 mg by mouth every morning.) 30 tablet 3   sertraline (ZOLOFT) 100 MG tablet Take 200 mg by mouth every morning.     Spacer/Aero-Holding Chambers (AEROCHAMBER MV) inhaler Use as instructed 1 each 0   nicotine (NICODERM CQ -  DOSED IN MG/24 HR) 7 mg/24hr patch Place 1 patch (7 mg total) onto the skin daily. (Patient not taking: Reported on 04/21/2022) 28 patch 1   No current facility-administered medications for this visit.     PHYSICAL EXAMINATION: ECOG PERFORMANCE STATUS: 0 - Asymptomatic Vitals:   06/02/22 0849  BP: 125/87  Pulse: 87  Resp: 16  Temp: (!) 96 F (35.6 C)  SpO2: 94%   Filed Weights   06/02/22 0849  Weight: 142 lb (64.4 kg)    Physical Exam Constitutional:      General: She is not in acute distress.    Appearance: She is well-developed. She is not ill-appearing.  HENT:     Head: Atraumatic.     Nose: Nose normal.     Mouth/Throat:     Pharynx: Oropharyngeal exudate and posterior oropharyngeal erythema present.  Eyes:     General: No scleral icterus.    Conjunctiva/sclera: Conjunctivae normal.  Cardiovascular:     Rate and Rhythm: Normal rate and regular rhythm.  Pulmonary:     Effort: Pulmonary effort is normal.     Breath sounds: Normal breath sounds.  Abdominal:     General: Bowel sounds are normal. There is no distension.     Palpations: Abdomen is soft.     Tenderness: There is no abdominal tenderness.  Musculoskeletal:        General: No tenderness or deformity. Normal range of motion.     Cervical back: Normal range of motion and neck supple. No rigidity or tenderness.  Lymphadenopathy:     Cervical: No cervical adenopathy.  Skin:    General: Skin is warm and dry.  Coloration: Skin is not pale.  Neurological:     General: No focal deficit present.     Mental Status: She is alert and oriented to person, place, and time.  Psychiatric:        Mood and Affect: Mood normal.        Behavior: Behavior normal.     LABORATORY DATA:  I have reviewed the data as listed Lab Results  Component Value Date   WBC 9.5 06/02/2022   HGB 14.2 06/02/2022   HCT 42.4 06/02/2022   MCV 93.2 06/02/2022   PLT 143 (L) 06/02/2022   Recent Labs    06/20/21 1234  08/11/21 1031 02/17/22 1310 04/21/22 0632 04/23/22 0358 05/26/22 0841 06/02/22 0818  NA 139   < > 137   < > 144 134* 136  K 3.8   < > 4.4   < > 3.8 4.2 4.1  CL 99   < > 97*   < > 107 101 101  CO2 31   < > 33*   < > _0 GLUCOSE 91   < > 97   < > 135* 277* 230*  BUN 10   < > 12   < > _1 CREATININE 0.97   < > 1.11*   < > 0.76 0.90 0.96  CALCIUM 10.7*   < > 9.8   < > 9.0 8.8* 8.9  GFRNONAA 69  --  >60   < > >60 >60 >60  GFRAA 80  --   --   --   --   --   --   PROT 6.9   < > 8.1  --   --  7.2 7.0  ALBUMIN  --   --  4.4  --   --  3.7 3.8  AST 14   < > 16  --   --  20 18  ALT 12   < > 14  --   --  16 14  ALKPHOS  --   --  116  --   --  104 87  BILITOT 0.5   < > 0.2*  --   --  0.3 0.2*   < > = values in this interval not displayed.    Iron/TIBC/Ferritin/ %Sat No results found for: "IRON", "TIBC", "FERRITIN", "IRONPCTSAT"    RADIOGRAPHIC STUDIES: I have personally reviewed the radiological images as listed and agreed with the findings in the report. IR IMAGING GUIDED PORT INSERTION  Result Date: 05/11/2022 INDICATION: Rectal cancer, chemotherapy access EXAM: Ultrasound-guided puncture of the right internal jugular vein Placement of a right-sided chest port using fluoroscopic guidance MEDICATIONS: Per EMR ANESTHESIA/SEDATION: Moderate (conscious) sedation was employed during this procedure. A total of Versed 1 mg and Fentanyl 50 mcg was administered intravenously. Moderate Sedation Time: 15 minutes. The patient's level of consciousness and vital signs were monitored continuously by radiology nursing throughout the procedure under my direct supervision. FLUOROSCOPY TIME:  Fluoroscopy Time: 0.5 minutes (3 mGy) COMPLICATIONS: None immediate. PROCEDURE: Informed written consent was obtained from the patient after a thorough discussion of the procedural risks, benefits and alternatives. All questions were addressed. Maximal Sterile Barrier Technique was utilized including caps, mask,  sterile gowns, sterile gloves, sterile drape, hand hygiene and skin antiseptic. A timeout was performed prior to the initiation of the procedure. The patient was placed supine on the exam table. The right neck and chest was prepped and draped in the standard sterile fashion. A preliminary ultrasound  of the right neck was performed and demonstrates a patent right internal jugular vein. A permanent ultrasound image was stored in the electronic medical record. The overlying skin was anesthetized with 1% Lidocaine. Using ultrasound guidance, access was obtained into the right internal jugular vein using a 21 gauge micropuncture set. A wire was advanced into the SVC, a short incision was made at the puncture site, and serial dilatation performed. Next, in an ipsilateral infraclavicular location, an incision was made at the site of the subcutaneous reservoir. Blunt dissection was used to open a pocket to contain the reservoir. A subcutaneous tunnel was then created from the port site to the puncture site. A(n) 8 Fr single lumen catheter was advanced through the tunnel. The catheter was attached to the port and this was placed in the subcutaneous pocket. Under fluoroscopic guidance, a peel away sheath was placed, and the catheter was trimmed to the appropriate length and was advanced into the central veins. The catheter length is 22 cm. The tip of the catheter lies near the superior cavoatrial junction. The port flushes and aspirates appropriately. The port was flushed and locked with heparinized saline. The port pocket was closed in 2 layers using 3-0 and 4-0 Vicryl/absorbable suture. Dermabond was also applied to both incisions. The patient tolerated the procedure well and was transferred to recovery in stable condition. IMPRESSION: Successful placement of a right-sided chest port via the right internal jugular vein. The port is ready for immediate use. Electronically Signed   By: Yasser  El-Abd M.D.   On: 05/11/2022  15:33      ASSESSMENT & PLAN:  No diagnosis found.  Cancer Staging  Rectal cancer (HCC) Staging form: Colon and Rectum, AJCC 8th Edition - Pathologic stage from 05/03/2022: Stage IIIB (pT3, pN1b, cM0) - Signed by Yu, Zhou, MD on 05/03/2022   #Stage III rectal adenocarcinoma. pT3 pN1b cM0 Status post APR. She has initiated FOLFOX chemotherapy and is currently s/p cycle 1. Tolerated moderately- see below.   # Sore throat- start nystatin and magic mouthwash. Given mild lymphadenopathy, cover for strep as well. Start augmentin x 5 days. Per patient, she can take amoxicillins (hx of allergy to penicillin).   #Leukocytosis, mild.  Likely reactive.  Chronic smoking.  #Tobacco use,  recommend smoke cessation.  Return of visit:  # IV fluids today Magic mouthwash & nystatin Prescription for augmentin x 5 days  All questions were answered. The patient knows to call the clinic with any problems questions or concerns.   , DNP, AGNP-C [facility] [phone] (clinic) 06/02/2022  cc Sowles, Krichna, MD          

## 2022-06-02 NOTE — Telephone Encounter (Signed)
Yes that is fine   Juliann Pulse  From: Jarrett Soho '@[email or web address]> Sent: Friday, June 02, 2022 2:21:12 PM To: Ulice Dash '@[email or web address]> Subject: Secure financial ? [employer]    Pt needs magical mouth wash and insurance does not cover it, it is $50 for 480 ml at [facility] and she cannot afford it, Can this be put on the [employer]. April Luna (Legal Name)    Pharmacy notified to charge to [facility] She said they will deliver this tomorrow to patient and that she is aware of deliver for tomorrow

## 2022-06-02 NOTE — Telephone Encounter (Signed)
Call from pharmacy that insurance will not cover Magic Mouth Wash and the cost for 480 ml is $50 which the patient does not have, asking if we can assist patient with this.  I have sent an email to Zannie Cove to see if Jeannetta Ellis is available for this. Will await answer from her

## 2022-06-02 NOTE — Progress Notes (Signed)
Very sore throat x2 days. Throat feels tight and swollen. Lymph nodes feel "infected" Has not noted any documented fevers but has had sweats. Has had a hard time eating and drinking due to pain.

## 2022-06-02 NOTE — Patient Instructions (Signed)

## 2022-06-07 ENCOUNTER — Other Ambulatory Visit: Payer: Self-pay | Admitting: Family Medicine

## 2022-06-07 DIAGNOSIS — E1129 Type 2 diabetes mellitus with other diabetic kidney complication: Secondary | ICD-10-CM

## 2022-06-09 ENCOUNTER — Inpatient Hospital Stay: Payer: Medicare Other

## 2022-06-09 ENCOUNTER — Inpatient Hospital Stay (HOSPITAL_BASED_OUTPATIENT_CLINIC_OR_DEPARTMENT_OTHER): Payer: Medicare Other | Admitting: Oncology

## 2022-06-09 ENCOUNTER — Encounter: Payer: Self-pay | Admitting: Oncology

## 2022-06-09 VITALS — BP 134/84 | HR 97 | Temp 97.2°F | Wt 140.4 lb

## 2022-06-09 DIAGNOSIS — E1129 Type 2 diabetes mellitus with other diabetic kidney complication: Secondary | ICD-10-CM | POA: Diagnosis not present

## 2022-06-09 DIAGNOSIS — Z5111 Encounter for antineoplastic chemotherapy: Secondary | ICD-10-CM | POA: Diagnosis not present

## 2022-06-09 DIAGNOSIS — C20 Malignant neoplasm of rectum: Secondary | ICD-10-CM | POA: Diagnosis not present

## 2022-06-09 DIAGNOSIS — Z720 Tobacco use: Secondary | ICD-10-CM

## 2022-06-09 DIAGNOSIS — R634 Abnormal weight loss: Secondary | ICD-10-CM | POA: Insufficient documentation

## 2022-06-09 DIAGNOSIS — Z7189 Other specified counseling: Secondary | ICD-10-CM

## 2022-06-09 DIAGNOSIS — R809 Proteinuria, unspecified: Secondary | ICD-10-CM

## 2022-06-09 LAB — CBC WITH DIFFERENTIAL/PLATELET
Abs Immature Granulocytes: 0.01 10*3/uL (ref 0.00–0.07)
Basophils Absolute: 0.1 10*3/uL (ref 0.0–0.1)
Basophils Relative: 1 %
Eosinophils Absolute: 0.2 10*3/uL (ref 0.0–0.5)
Eosinophils Relative: 2 %
HCT: 43.8 % (ref 36.0–46.0)
Hemoglobin: 14.6 g/dL (ref 12.0–15.0)
Immature Granulocytes: 0 %
Lymphocytes Relative: 24 %
Lymphs Abs: 1.9 10*3/uL (ref 0.7–4.0)
MCH: 31 pg (ref 26.0–34.0)
MCHC: 33.3 g/dL (ref 30.0–36.0)
MCV: 93 fL (ref 80.0–100.0)
Monocytes Absolute: 0.7 10*3/uL (ref 0.1–1.0)
Monocytes Relative: 8 %
Neutro Abs: 5.2 10*3/uL (ref 1.7–7.7)
Neutrophils Relative %: 65 %
Platelets: 123 10*3/uL — ABNORMAL LOW (ref 150–400)
RBC: 4.71 MIL/uL (ref 3.87–5.11)
RDW: 13 % (ref 11.5–15.5)
WBC: 8 10*3/uL (ref 4.0–10.5)
nRBC: 0 % (ref 0.0–0.2)

## 2022-06-09 LAB — COMPREHENSIVE METABOLIC PANEL
ALT: 16 U/L (ref 0–44)
AST: 20 U/L (ref 15–41)
Albumin: 3.9 g/dL (ref 3.5–5.0)
Alkaline Phosphatase: 87 U/L (ref 38–126)
Anion gap: 8 (ref 5–15)
BUN: 10 mg/dL (ref 6–20)
CO2: 26 mmol/L (ref 22–32)
Calcium: 8.7 mg/dL — ABNORMAL LOW (ref 8.9–10.3)
Chloride: 102 mmol/L (ref 98–111)
Creatinine, Ser: 0.86 mg/dL (ref 0.44–1.00)
GFR, Estimated: >60 mL/min (ref >60)
Glucose, Bld: 225 mg/dL — ABNORMAL HIGH (ref 70–99)
Potassium: 3.7 mmol/L (ref 3.5–5.1)
Sodium: 136 mmol/L (ref 135–145)
Total Bilirubin: 0.3 mg/dL (ref 0.3–1.2)
Total Protein: 7.1 g/dL (ref 6.5–8.1)

## 2022-06-09 MED ORDER — PALONOSETRON HCL INJECTION 0.25 MG/5ML
0.2500 mg | Freq: Once | INTRAVENOUS | Status: AC
  Administered 2022-06-09: 0.25 mg via INTRAVENOUS
  Filled 2022-06-09: qty 5

## 2022-06-09 MED ORDER — DEXTROSE 5 % IV SOLN
Freq: Once | INTRAVENOUS | Status: AC
  Filled 2022-06-09: qty 250

## 2022-06-09 MED ORDER — SODIUM CHLORIDE 0.9 % IV SOLN
10.0000 mg | Freq: Once | INTRAVENOUS | Status: AC
  Administered 2022-06-09: 10 mg via INTRAVENOUS
  Filled 2022-06-09: qty 10

## 2022-06-09 MED ORDER — FLUOROURACIL CHEMO INJECTION 2.5 GM/50ML
400.0000 mg/m2 | Freq: Once | INTRAVENOUS | Status: AC
  Administered 2022-06-09: 650 mg via INTRAVENOUS
  Filled 2022-06-09: qty 13

## 2022-06-09 MED ORDER — LEUCOVORIN CALCIUM INJECTION 350 MG
650.0000 mg | Freq: Once | INTRAVENOUS | Status: AC
  Administered 2022-06-09: 650 mg via INTRAVENOUS
  Filled 2022-06-09: qty 32.5

## 2022-06-09 MED ORDER — SODIUM CHLORIDE 0.9 % IV SOLN
2400.0000 mg/m2 | INTRAVENOUS | Status: DC
  Administered 2022-06-09: 3950 mg via INTRAVENOUS
  Filled 2022-06-09: qty 79

## 2022-06-09 MED ORDER — OXALIPLATIN CHEMO INJECTION 100 MG/20ML
85.0000 mg/m2 | Freq: Once | INTRAVENOUS | Status: AC
  Administered 2022-06-09: 140 mg via INTRAVENOUS
  Filled 2022-06-09: qty 10

## 2022-06-09 NOTE — Assessment & Plan Note (Signed)
Refer to nutritionist 

## 2022-06-09 NOTE — Progress Notes (Signed)
Patient here for follow up. Reports that she only took one dose of antibiotic because she realized that she had water in her ear and it was irritating her throat.

## 2022-06-09 NOTE — Assessment & Plan Note (Signed)
Serum glucose is in 200s Discussed about diabetic diet and glucose monitoring

## 2022-06-09 NOTE — Assessment & Plan Note (Signed)
Stage III. pT3 pN1b cM0, s/p APR. Labs reviewed and discussed with patient Proceed with cycle 2 FOLFOX.  She will return on day 4 for pump discontinuation

## 2022-06-09 NOTE — Assessment & Plan Note (Signed)
Smoke cessation recommended 

## 2022-06-09 NOTE — Progress Notes (Addendum)
Hematology/Oncology Progress note [phone] [phone]            Patient Care Team: Steele Sizer, MD as PCP - General (Family Medicine) Sharmaine Base, MD as Referring Physician (Psychiatry) Bjorn Loser, MD as Consulting Physician (Urology) Germaine Pomfret, [facility] (Pharmacist) Vern Claude, [place] as Social Worker Clent Jacks, RN as Oncology Nurse Navigator  REFERRING PROVIDER: Steele Sizer, MD   ASSESSMENT & PLAN:   Rectal cancer [facility]) Stage III. pT3 pN1b cM0, s/p APR. Labs reviewed and discussed with patient Proceed with cycle 2 FOLFOX.  She will return on day 4 for pump discontinuation  Tobacco use Smoke cessation recommended.    Encounter for antineoplastic chemotherapy Chemotherapy plan as listed above  Type 2 diabetes mellitus with microalbuminuria, without long-term current use of insulin (HCC) Serum glucose is in 200s Discussed about diabetic diet and glucose monitoring  Weight loss Refer to nutritionist  Orders Placed This Encounter  Procedures   Ambulatory Referral to [facility] Nutrition    Referral Priority:   Routine    Referral Type:   Consultation    Referral Reason:   Specialty Services Required    Number of Visits Requested:   1    All questions were answered. The patient knows to call the clinic with any problems, questions or concerns.  [name], MD, PhD [facility] Hematology Oncology 06/09/2022      CHIEF COMPLAINTS/REASON FOR VISIT:  Evaluation of rectal cancer.  HISTORY OF PRESENTING ILLNESS:   April Luna is a  51 y.o.  female presents for treatment of rectal cancer Oncology History  Rectal cancer ([place])  02/26/2022 Imaging   MRI PELVIS WITHOUT CONTRAST- By imaging, rectal cancer stage:  T1/T2, N0, Mx    03/02/2022 Imaging   CT CHEST AND ABDOMEN WITH CONTRAST 1. No convincing evidence of metastatic disease within the chest or abdomen. 2. Atrophic left kidney with multifocal  renal scarring and cortical calcifications as well as nonobstructive renal stones measuring up to 5 mm. 3. Prominent left-sided predominant retroperitoneal lymph nodes measuring up to 8 mm near the level of the renal hilum, overall decreased in size dating back to CT September 19, 2018 and favored reactive related to left renal inflammation. 4.  Aortic Atherosclerosis (ICD10-I70.0).   03/27/2022 Genetic Testing    [facility] Custom[employer]+RNA cancer panel found no pathogenic mutations.    04/21/2022 Initial Diagnosis   Rectal cancer - baseline CEA 3.6 -02/09/2022, patient had colonoscopy which showed renal mass 10 cm from anal verge.  5 mm polyp in ascending colon.  Removed and retrieved. Pathology showed rectal adenocarcinoma.  The polyp in the ascending colon is a tubular adenoma. -04/21/2022, patient underwent robotic assisted ultralow anterior resection. Pathology showed moderately differentiated adenocarcinoma, 4.5 cm in maximal extent, with focal extension through muscularis propria into perirectal soft tissue.  3 lymph nodes positive for metastatic carcinoma.  Negative margin.  pT3 pN1b, MSI stable.   05/03/2022 Cancer Staging   Staging form: Colon and Rectum, AJCC 8th Edition - Pathologic stage from 05/03/2022: Stage IIIB (pT3, pN1b, cM0) - Signed by [name], MD on 05/03/2022 Stage prefix: Initial diagnosis   05/11/2022 Miscellaneous   Medi port placed by Dr.White   05/26/2022 -  Chemotherapy   FOLFOX every 2 weeks.    Patient has bipolar and schizophrenia..  Patient is married and lives at home with her husband and son.   Patient is here by herself today Tolerates FOLFOX chemotherapy.Denies fever, chills, nausea, vomiting, diarrhea,  chest pain, shortness of breath, abdominal pain, urinary symptoms, lower extremity swelling.    Review of Systems  Constitutional:  Negative for appetite change, chills, fatigue and fever.  HENT:   Negative for hearing loss and voice change.   Eyes:  Negative for  eye problems.  Respiratory:  Negative for chest tightness and cough.   Cardiovascular:  Negative for chest pain.  Gastrointestinal:  Negative for abdominal distention, abdominal pain and blood in stool.  Endocrine: Negative for hot flashes.  Genitourinary:  Negative for difficulty urinating and frequency.   Musculoskeletal:  Negative for arthralgias.  Skin:  Negative for itching and rash.  Neurological:  Negative for extremity weakness.  Hematological:  Negative for adenopathy.  Psychiatric/Behavioral:  Negative for confusion.     MEDICAL HISTORY:  Past Medical History:  Diagnosis Date   Allergy    pollen   Anxiety    Arthritis    right hip   Bipolar 1 disorder (HCC)    Cancer (HCC)    rectal   Chronic kidney disease    COPD (chronic obstructive pulmonary disease) (HCC)    Depression    Family history of adverse reaction to anesthesia    grand father had a stroke during anesthesia   Family history of breast cancer    Family history of colon cancer    Family history of uterine cancer    GERD (gastroesophageal reflux disease)    History of kidney stones    Hyperlipidemia    Hypertension    Hypothyroidism    Panic attack    Pneumonia    Psoriasis    Sleep apnea 08/11/2021   No CPAP   Type 2 diabetes mellitus with microalbuminuria, without long-term current use of insulin ([place]) 06/24/2019    SURGICAL HISTORY: Past Surgical History:  Procedure Laterality Date   BREAST BIOPSY Left 12/14/2021   [place] bx, venus marker, path pending   CESAREAN SECTION     COLONOSCOPY WITH PROPOFOL N/A 02/09/2022   Procedure: COLONOSCOPY WITH PROPOFOL;  Surgeon: Lin Landsman, MD;  Location: [place];  Service: Endoscopy;  Laterality: N/A;  sleep apnea   CYSTOSCOPY W/ RETROGRADES Left 11/08/2018   Procedure: CYSTOSCOPY WITH RETROGRADE PYELOGRAM;  Surgeon: Billey Co, MD;  Location: [facility];  Service: Urology;  Laterality: Left;   CYSTOSCOPY/URETEROSCOPY/HOLMIUM  LASER/STENT PLACEMENT Left 11/08/2018   Procedure: CYSTOSCOPY/URETEROSCOPY/HOLMIUM LASER/STENT PLACEMENT;  Surgeon: Billey Co, MD;  Location: [facility];  Service: Urology;  Laterality: Left;   IR IMAGING GUIDED PORT INSERTION  05/11/2022   MOUTH SURGERY     wisdom teeth extraction   MOUTH SURGERY     teeth removal   POLYPECTOMY N/A 02/09/2022   Procedure: POLYPECTOMY;  Surgeon: Lin Landsman, MD;  Location: [place];  Service: Endoscopy;  Laterality: N/A;   XI ROBOTIC ASSISTED LOWER ANTERIOR RESECTION N/A 04/21/2022   Procedure: XI ROBOTIC ASSISTED LOWER ANTERIOR RESECTION WITH COLOSTOMY, BILATERAL TAP BLOCK, ASSESSMENT OF TISSUE PERFUSSION WITH FIREFLY INJECTION;  Surgeon: Ileana Roup, MD;  Location: [facility];  Service: General;  Laterality: N/A;    SOCIAL HISTORY: Social History   Socioeconomic History   Marital status: Married    Spouse name: Montine Circle   Number of children: 1   Years of education: 12   Highest education level: High school graduate  Occupational History   Occupation: unemployed    Comment: disabled  Tobacco Use   Smoking status: Every Day    Packs/day: 0.25  Years: 34.00    Total pack years: 8.50    Types: Cigarettes    Start date: 05/13/1985   Smokeless tobacco: Never   Tobacco comments:    1 PPD 04/20/223  Vaping Use   Vaping Use: Former   Start date: 05/25/2018   Quit date: 09/24/2018  Substance and Sexual Activity   Alcohol use: Not Currently    Alcohol/week: 4.0 standard drinks of alcohol    Types: 4 Glasses of wine per week    Comment: quit ETOH in Feb. 2023   Drug use: Not Currently    Types: Marijuana    Comment: 2 days ago   Sexual activity: Not Currently    Birth control/protection: Post-menopausal  Other Topics Concern   Not on file  Social History Narrative   Lives with husband and son    Social Determinants of Health   Financial Resource Strain: Medium Risk (12/02/2021)   Overall Financial  Resource Strain (CARDIA)    Difficulty of Paying Living Expenses: Somewhat hard  Food Insecurity: No Food Insecurity (12/02/2021)   Hunger Vital Sign    Worried About Running Out of Food in the Last Year: Never true    Ran Out of Food in the Last Year: Never true  Transportation Needs: Unmet Transportation Needs (12/02/2021)   PRAPARE - Transportation    Lack of Transportation (Medical): Yes    Lack of Transportation (Non-Medical): Yes  Physical Activity: Inactive (12/02/2021)   Exercise Vital Sign    Days of Exercise per Week: 0 days    Minutes of Exercise per Session: 0 min  Stress: No Stress Concern Present (12/02/2021)   [place] of Stress : Not at all  Social Connections: Moderately Integrated (12/02/2021)   Social Connection and Isolation Panel [NHANES]    Frequency of Communication with Friends and Family: More than three times a week    Frequency of Social Gatherings with Friends and Family: Twice a week    Attends Religious Services: More than 4 times per year    Active Member of [employer] or Organizations: No    Attends Archivist Meetings: Never    Marital Status: Married  Human resources officer Violence: Not At Risk (07/07/2021)   Humiliation, Afraid, Rape, and Kick questionnaire    Fear of Current or Ex-Partner: No    Emotionally Abused: No    Physically Abused: No    Sexually Abused: No    FAMILY HISTORY: Family History  Problem Relation Age of Onset   Depression Mother    Anxiety disorder Mother    Diabetes Mother    Hypertension Mother    Hyperlipidemia Mother    Cancer Mother    Uterine cancer Mother 73   Cervical cancer Mother 84   Colon cancer Father    Depression Brother    Anxiety disorder Brother    Cancer Maternal Aunt        unk types   Diabetes Mellitus II Maternal Grandmother    Hypercholesterolemia Maternal Grandmother    Breast cancer Maternal Grandmother    Cancer  Paternal Grandmother    Diabetes Paternal Grandmother    Melanoma Paternal Grandmother    Stomach cancer Paternal Grandmother     ALLERGIES:  is allergic to metformin and related, nsaids, perphenazine, sulfa antibiotics, abilify [aripiprazole], and penicillins.  MEDICATIONS:  Current Outpatient Medications  Medication Sig Dispense Refill   acetaminophen (TYLENOL) 325 MG tablet Take 650 mg by  mouth every 6 (six) hours as needed for headache (pain).     albuterol (VENTOLIN HFA) 108 (90 Base) MCG/ACT inhaler Inhale 2 puffs into the lungs every 6 (six) hours as needed for wheezing or shortness of breath. 1 each 5   amantadine (SYMMETREL) 100 MG capsule Take 100 mg by mouth 2 (two) times daily.     blood glucose meter kit and supplies Dispense based on patient and insurance preference. Use up to four times daily as directed. (FOR ICD-10 E10.9, E11.9). 1 each 0   Budeson-Glycopyrrol-Formoterol (BREZTRI AEROSPHERE) 160-9-4.8 MCG/ACT AERO Inhale 2 puffs into the lungs in the morning and at bedtime. 5.9 g 11   buPROPion (WELLBUTRIN XL) 150 MG 24 hr tablet Take 150 mg by mouth every morning.     Cholecalciferol (VITAMIN D-3) 125 MCG (5000 UT) TABS Take 5,000 Units by mouth daily. 30 tablet 1   FARXIGA 10 MG TABS tablet TAKE 1 TABLET BY MOUTH DAILY BEFORE BREAKFAST. 30 tablet 2   gabapentin (NEURONTIN) 300 MG capsule Take 300 mg by mouth 2 (two) times daily.     guaiFENesin (MUCINEX) 600 MG 12 hr tablet Take 2 tablets (1,200 mg total) by mouth 2 (two) times daily as needed for to loosen phlegm or cough.     hydrOXYzine (VISTARIL) 50 MG capsule Take 50 mg by mouth 3 (three) times daily as needed for anxiety.     icosapent Ethyl (VASCEPA) 1 g capsule Take 2 capsules (2 g total) by mouth 2 (two) times daily. 120 capsule 3   levothyroxine (SYNTHROID) 75 MCG tablet TAKE 1 TABLET BY MOUTH DAILY BEFORE BREAKFAST 90 tablet 0   lidocaine-prilocaine (EMLA) cream Apply to affected area once 30 g 3   linaclotide  (LINZESS) 145 MCG CAPS capsule TAKE 1 CAPSULE BY MOUTH DAILY BEFORE BREAKFAST 30 capsule 1   loratadine (CLARITIN) 10 MG tablet Take 10 mg by mouth every morning.     losartan (COZAAR) 25 MG tablet Take 1 tablet (25 mg total) by mouth daily. 30 tablet 3   NON FORMULARY Pt uses a cpap nightly     nystatin (MYCOSTATIN) 100000 UNIT/ML suspension Take 5 mLs (500,000 Units total) by mouth 4 (four) times daily. Swish and spit 60 mL 0   ondansetron (ZOFRAN) 8 MG tablet Take 1 tablet (8 mg total) by mouth 2 (two) times daily as needed for refractory nausea / vomiting. Start on day 3 after chemotherapy. 30 tablet 1   paliperidone (INVEGA SUSTENNA) 234 MG/1.5ML SUSY injection Inject 234 mg into the muscle every 30 (thirty) days. On or about the 14th of each month     pantoprazole (PROTONIX) 40 MG tablet Take 1 tablet (40 mg total) by mouth daily with breakfast. 90 tablet 0   Pediatric Multivit-Minerals-C (CHEWABLES MULTIVITAMIN PO) Take 1 tablet by mouth daily.     prochlorperazine (COMPAZINE) 10 MG tablet Take 1 tablet (10 mg total) by mouth every 6 (six) hours as needed for nausea or vomiting. 60 tablet 3   rosuvastatin (CRESTOR) 40 MG tablet Take 1 tablet (40 mg total) by mouth daily. (Patient taking differently: Take 40 mg by mouth every morning.) 30 tablet 3   sertraline (ZOLOFT) 100 MG tablet Take 200 mg by mouth every morning.     Spacer/Aero-Holding Chambers (AEROCHAMBER MV) inhaler Use as instructed 1 each 0   amoxicillin-clavulanate (AUGMENTIN) 875-125 MG tablet Take 1 tablet by mouth 2 (two) times daily. (Patient not taking: Reported on 06/09/2022) 5 tablet 0   nicotine (  NICODERM CQ - DOSED IN MG/24 HR) 7 mg/24hr patch Place 1 patch (7 mg total) onto the skin daily. (Patient not taking: Reported on 06/09/2022) 28 patch 1   No current facility-administered medications for this visit.   Facility-Administered Medications Ordered in Other Visits  Medication Dose Route Frequency Provider Last Rate Last  Admin   fluorouracil (ADRUCIL) 3,950 mg in sodium chloride 0.9 % 71 mL chemo infusion  2,400 mg/m2 (Treatment Plan Recorded) Intravenous 1 day or 1 dose [name], MD   3,950 mg at 06/09/22 1315     PHYSICAL EXAMINATION: ECOG PERFORMANCE STATUS: 0 - Asymptomatic Vitals:   06/09/22 0844  BP: 134/84  Pulse: 97  Temp: (!) 97.2 F (36.2 C)   Filed Weights   06/09/22 0844  Weight: 140 lb 6.4 oz (63.7 kg)    Physical Exam Constitutional:      General: She is not in acute distress. HENT:     Head: Normocephalic and atraumatic.  Eyes:     General: No scleral icterus. Cardiovascular:     Rate and Rhythm: Normal rate and regular rhythm.     Heart sounds: Normal heart sounds.  Pulmonary:     Effort: Pulmonary effort is normal. No respiratory distress.     Breath sounds: No wheezing.  Abdominal:     General: Bowel sounds are normal. There is no distension.     Palpations: Abdomen is soft.  Musculoskeletal:        General: No deformity. Normal range of motion.     Cervical back: Normal range of motion and neck supple.  Skin:    General: Skin is warm and dry.     Findings: No erythema or rash.  Neurological:     Mental Status: She is alert and oriented to person, place, and time. Mental status is at baseline.     Cranial Nerves: No cranial nerve deficit.     Coordination: Coordination normal.  Psychiatric:        Mood and Affect: Mood normal.     LABORATORY DATA:  I have reviewed the data as listed    Latest Ref Rng & Units 06/09/2022    8:24 AM 06/02/2022    8:18 AM 05/26/2022    8:41 AM  CBC  WBC 4.0 - 10.5 K/uL 8.0  9.5  12.2   Hemoglobin 12.0 - 15.0 g/dL 14.6  14.2  14.5   Hematocrit 36.0 - 46.0 % 43.8  42.4  43.7   Platelets 150 - 400 K/uL 123  143  140       Latest Ref Rng & Units 06/09/2022    8:24 AM 06/02/2022    8:18 AM 05/26/2022    8:41 AM  CMP  Glucose 70 - 99 mg/dL 225  230  277   BUN 6 - 20 mg/dL '10  18  17   ' Creatinine 0.44 - 1.00 mg/dL 0.86  0.96  0.90    Sodium 135 - 145 mmol/L 136  136  134   Potassium 3.5 - 5.1 mmol/L 3.7  4.1  4.2   Chloride 98 - 111 mmol/L 102  101  101   CO2 22 - 32 mmol/L '26  26  27   ' Calcium 8.9 - 10.3 mg/dL 8.7  8.9  8.8   Total Protein 6.5 - 8.1 g/dL 7.1  7.0  7.2   Total Bilirubin 0.3 - 1.2 mg/dL 0.3  0.2  0.3   Alkaline Phos 38 - 126 U/L 87  87  104   AST 15 - 41 U/L '20  18  20   ' ALT 0 - 44 U/L '16  14  16      ' Iron/TIBC/Ferritin/ %Sat No results found for: "IRON", "TIBC", "FERRITIN", "IRONPCTSAT"    RADIOGRAPHIC STUDIES: I have personally reviewed the radiological images as listed and agreed with the findings in the report. IR IMAGING GUIDED PORT INSERTION  Result Date: 05/11/2022 INDICATION: Rectal cancer, chemotherapy access EXAM: Ultrasound-guided puncture of the right internal jugular vein Placement of a right-sided chest port using fluoroscopic guidance MEDICATIONS: Per EMR ANESTHESIA/SEDATION: Moderate (conscious) sedation was employed during this procedure. A total of Versed 1 mg and Fentanyl 50 mcg was administered intravenously. Moderate Sedation Time: 15 minutes. The patient's level of consciousness and vital signs were monitored continuously by radiology nursing throughout the procedure under my direct supervision. FLUOROSCOPY TIME:  Fluoroscopy Time: 0.5 minutes (3 mGy) COMPLICATIONS: None immediate. PROCEDURE: Informed written consent was obtained from the patient after a thorough discussion of the procedural risks, benefits and alternatives. All questions were addressed. Maximal Sterile Barrier Technique was utilized including caps, mask, sterile gowns, sterile gloves, sterile drape, hand hygiene and skin antiseptic. A timeout was performed prior to the initiation of the procedure. The patient was placed supine on the exam table. The right neck and chest was prepped and draped in the standard sterile fashion. A preliminary ultrasound of the right neck was performed and demonstrates a patent right  internal jugular vein. A permanent ultrasound image was stored in the electronic medical record. The overlying skin was anesthetized with 1% Lidocaine. Using ultrasound guidance, access was obtained into the right internal jugular vein using a 21 gauge micropuncture set. A wire was advanced into the SVC, a short incision was made at the puncture site, and serial dilatation performed. Next, in an ipsilateral infraclavicular location, an incision was made at the site of the subcutaneous reservoir. Blunt dissection was used to open a pocket to contain the reservoir. A subcutaneous tunnel was then created from the port site to the puncture site. A(n) 8 Fr single lumen catheter was advanced through the tunnel. The catheter was attached to the port and this was placed in the subcutaneous pocket. Under fluoroscopic guidance, a peel away sheath was placed, and the catheter was trimmed to the appropriate length and was advanced into the central veins. The catheter length is 22 cm. The tip of the catheter lies near the superior cavoatrial junction. The port flushes and aspirates appropriately. The port was flushed and locked with heparinized saline. The port pocket was closed in 2 layers using 3-0 and 4-0 Vicryl/absorbable suture. Dermabond was also applied to both incisions. The patient tolerated the procedure well and was transferred to recovery in stable condition. IMPRESSION: Successful placement of a right-sided chest port via the right internal jugular vein. The port is ready for immediate use. Electronically Signed   By: Albin Felling M.D.   On: 05/11/2022 15:33

## 2022-06-09 NOTE — Patient Instructions (Signed)
[facility]  Discharge Instructions: Thank you for choosing [facility] to provide your oncology and hematology care.  If you have a lab appointment with the Cancer Center, please go directly to the Cancer Center and check in at the registration area.  Wear comfortable clothing and clothing appropriate for easy access to any Portacath or PICC line.   We strive to give you quality time with your provider. You may need to reschedule your appointment if you arrive late (15 or more minutes).  Arriving late affects you and other patients whose appointments are after yours.  Also, if you miss three or more appointments without notifying the office, you may be dismissed from the clinic at the provider's discretion.      For prescription refill requests, have your pharmacy contact our office and allow 72 hours for refills to be completed.       To help prevent nausea and vomiting after your treatment, we encourage you to take your nausea medication as directed.  BELOW ARE SYMPTOMS THAT SHOULD BE REPORTED IMMEDIATELY: *FEVER GREATER THAN 100.4 F (38 C) OR HIGHER *CHILLS OR SWEATING *NAUSEA AND VOMITING THAT IS NOT CONTROLLED WITH YOUR NAUSEA MEDICATION *UNUSUAL SHORTNESS OF BREATH *UNUSUAL BRUISING OR BLEEDING *URINARY PROBLEMS (pain or burning when urinating, or frequent urination) *BOWEL PROBLEMS (unusual diarrhea, constipation, pain near the anus) TENDERNESS IN MOUTH AND THROAT WITH OR WITHOUT PRESENCE OF ULCERS (sore throat, sores in mouth, or a toothache) UNUSUAL RASH, SWELLING OR PAIN  UNUSUAL VAGINAL DISCHARGE OR ITCHING   Items with * indicate a potential emergency and should be followed up as soon as possible or go to the Emergency Department if any problems should occur.  Please show the CHEMOTHERAPY ALERT CARD or IMMUNOTHERAPY ALERT CARD at check-in to the Emergency Department and triage nurse.  Should you have questions after your  visit or need to cancel or reschedule your appointment, please contact [facility]  [phone] and follow the prompts.  Office hours are 8:00 a.m. to 4:30 p.m. Monday - Friday. Please note that voicemails left after 4:00 p.m. may not be returned until the following business day.  We are closed weekends and major holidays. You have access to a nurse at all times for urgent questions. Please call the main number to the clinic [phone] and follow the prompts.  For any non-urgent questions, you may also contact your provider using MyChart. We now offer e-Visits for anyone 18 and older to request care online for non-urgent symptoms. For details visit [email or web address].   Also download the MyChart app! Go to the app store, search "MyChart", open the app, select [place], and log in with your MyChart username and password.  Masks are optional in the cancer centers. If you would like for your care team to wear a mask while they are taking care of you, please let them know. For doctor visits, patients may have with them one support person who is at least 50 years old. At this time, visitors are not allowed in the infusion area.   

## 2022-06-09 NOTE — Assessment & Plan Note (Signed)
Chemotherapy plan as listed above 

## 2022-06-12 ENCOUNTER — Inpatient Hospital Stay: Payer: Medicare Other

## 2022-06-12 VITALS — BP 117/81 | HR 82 | Resp 18

## 2022-06-12 DIAGNOSIS — C20 Malignant neoplasm of rectum: Secondary | ICD-10-CM

## 2022-06-12 DIAGNOSIS — Z5111 Encounter for antineoplastic chemotherapy: Secondary | ICD-10-CM | POA: Diagnosis not present

## 2022-06-12 MED ORDER — HEPARIN SOD (PORK) LOCK FLUSH 100 UNIT/ML IV SOLN
500.0000 [IU] | Freq: Once | INTRAVENOUS | Status: AC | PRN
  Administered 2022-06-12: 500 [IU]
  Filled 2022-06-12: qty 5

## 2022-06-12 MED ORDER — SODIUM CHLORIDE 0.9% FLUSH
10.0000 mL | INTRAVENOUS | Status: DC | PRN
  Administered 2022-06-12: 10 mL
  Filled 2022-06-12: qty 10

## 2022-06-19 ENCOUNTER — Ambulatory Visit
Admission: RE | Admit: 2022-06-19 | Discharge: 2022-06-19 | Disposition: A | Discharge status: Home / Self Care | Payer: Medicare Other | Source: Ambulatory Visit | Attending: Radiation Oncology | Admitting: Radiation Oncology

## 2022-06-19 ENCOUNTER — Encounter: Payer: Self-pay | Admitting: Radiation Oncology

## 2022-06-19 VITALS — BP 118/87 | HR 126 | Temp 97.0°F | Resp 20 | Ht 59.0 in | Wt 134.6 lb

## 2022-06-19 DIAGNOSIS — C20 Malignant neoplasm of rectum: Secondary | ICD-10-CM

## 2022-06-22 ENCOUNTER — Inpatient Hospital Stay: Payer: Medicare Other

## 2022-06-22 MED FILL — Dexamethasone Sodium Phosphate Inj 100 MG/10ML: INTRAMUSCULAR | Qty: 1 | Status: AC

## 2022-06-22 NOTE — Progress Notes (Signed)
Nutrition  Patient did not show up for nutrition appointment today.  Sent message to scheduling to offer another appointment and message to patient's MD.   Desmond Lope B. Zenia Resides, [place], [place] Registered Dietitian [phone]

## 2022-06-23 ENCOUNTER — Inpatient Hospital Stay (HOSPITAL_BASED_OUTPATIENT_CLINIC_OR_DEPARTMENT_OTHER): Payer: Medicare Other | Admitting: Oncology

## 2022-06-23 ENCOUNTER — Encounter: Payer: Self-pay | Admitting: Oncology

## 2022-06-23 ENCOUNTER — Inpatient Hospital Stay: Payer: Medicare Other

## 2022-06-23 ENCOUNTER — Other Ambulatory Visit: Payer: Self-pay | Admitting: *Deleted

## 2022-06-23 VITALS — BP 111/68 | HR 97 | Resp 16

## 2022-06-23 DIAGNOSIS — D696 Thrombocytopenia, unspecified: Secondary | ICD-10-CM

## 2022-06-23 DIAGNOSIS — R7989 Other specified abnormal findings of blood chemistry: Secondary | ICD-10-CM

## 2022-06-23 DIAGNOSIS — C20 Malignant neoplasm of rectum: Secondary | ICD-10-CM

## 2022-06-23 DIAGNOSIS — Z5111 Encounter for antineoplastic chemotherapy: Secondary | ICD-10-CM | POA: Diagnosis not present

## 2022-06-23 DIAGNOSIS — E876 Hypokalemia: Secondary | ICD-10-CM

## 2022-06-23 DIAGNOSIS — Z7189 Other specified counseling: Secondary | ICD-10-CM

## 2022-06-23 LAB — COMPREHENSIVE METABOLIC PANEL
ALT: 14 U/L (ref 0–44)
AST: 19 U/L (ref 15–41)
Albumin: 3.6 g/dL (ref 3.5–5.0)
Alkaline Phosphatase: 81 U/L (ref 38–126)
Anion gap: 8 (ref 5–15)
BUN: 11 mg/dL (ref 6–20)
CO2: 27 mmol/L (ref 22–32)
Calcium: 8.7 mg/dL — ABNORMAL LOW (ref 8.9–10.3)
Chloride: 101 mmol/L (ref 98–111)
Creatinine, Ser: 1.23 mg/dL — ABNORMAL HIGH (ref 0.44–1.00)
GFR, Estimated: 54 mL/min — ABNORMAL LOW (ref >60)
Glucose, Bld: 154 mg/dL — ABNORMAL HIGH (ref 70–99)
Potassium: 3.3 mmol/L — ABNORMAL LOW (ref 3.5–5.1)
Sodium: 136 mmol/L (ref 135–145)
Total Bilirubin: 0.3 mg/dL (ref 0.3–1.2)
Total Protein: 6.6 g/dL (ref 6.5–8.1)

## 2022-06-23 LAB — CBC WITH DIFFERENTIAL/PLATELET
Abs Immature Granulocytes: 0.01 10*3/uL (ref 0.00–0.07)
Basophils Absolute: 0.1 10*3/uL (ref 0.0–0.1)
Basophils Relative: 1 %
Eosinophils Absolute: 0.3 10*3/uL (ref 0.0–0.5)
Eosinophils Relative: 4 %
HCT: 42.3 % (ref 36.0–46.0)
Hemoglobin: 14.1 g/dL (ref 12.0–15.0)
Immature Granulocytes: 0 %
Lymphocytes Relative: 28 %
Lymphs Abs: 2.1 10*3/uL (ref 0.7–4.0)
MCH: 31.3 pg (ref 26.0–34.0)
MCHC: 33.3 g/dL (ref 30.0–36.0)
MCV: 94 fL (ref 80.0–100.0)
Monocytes Absolute: 0.8 10*3/uL (ref 0.1–1.0)
Monocytes Relative: 11 %
Neutro Abs: 4.3 10*3/uL (ref 1.7–7.7)
Neutrophils Relative %: 56 %
Platelets: 88 10*3/uL — ABNORMAL LOW (ref 150–400)
RBC: 4.5 MIL/uL (ref 3.87–5.11)
RDW: 13.5 % (ref 11.5–15.5)
WBC: 7.5 10*3/uL (ref 4.0–10.5)
nRBC: 0 % (ref 0.0–0.2)

## 2022-06-23 MED ORDER — SODIUM CHLORIDE 0.9 % IV SOLN
3950.0000 mg | INTRAVENOUS | Status: DC
  Administered 2022-06-23: 3950 mg via INTRAVENOUS
  Filled 2022-06-23: qty 79

## 2022-06-23 MED ORDER — HYDROCORTISONE 0.5 % EX CREA: 1.0000 | TOPICAL_CREAM | Freq: Two times a day (BID) | CUTANEOUS | 1 refills | Status: DC | PRN

## 2022-06-23 MED ORDER — POTASSIUM CHLORIDE IN NACL 20-0.9 MEQ/L-% IV SOLN
Freq: Once | INTRAVENOUS | Status: AC
  Filled 2022-06-23: qty 1000

## 2022-06-23 MED ORDER — SODIUM CHLORIDE 0.9% FLUSH
10.0000 mL | INTRAVENOUS | Status: DC | PRN
  Administered 2022-06-23: 10 mL
  Filled 2022-06-23: qty 10

## 2022-06-23 MED ORDER — LEUCOVORIN CALCIUM INJECTION 350 MG
650.0000 mg | Freq: Once | INTRAVENOUS | Status: AC
  Administered 2022-06-23: 650 mg via INTRAVENOUS
  Filled 2022-06-23: qty 25

## 2022-06-23 MED ORDER — PALONOSETRON HCL INJECTION 0.25 MG/5ML
0.2500 mg | Freq: Once | INTRAVENOUS | Status: AC
  Administered 2022-06-23: 0.25 mg via INTRAVENOUS
  Filled 2022-06-23: qty 5

## 2022-06-23 MED ORDER — SODIUM CHLORIDE 0.9 % IV SOLN
Freq: Once | INTRAVENOUS | Status: DC
  Filled 2022-06-23: qty 250

## 2022-06-23 MED ORDER — DEXTROSE 5 % IV SOLN
Freq: Once | INTRAVENOUS | Status: AC
  Filled 2022-06-23: qty 250

## 2022-06-23 MED ORDER — FLUOROURACIL CHEMO INJECTION 2.5 GM/50ML
400.0000 mg/m2 | Freq: Once | INTRAVENOUS | Status: AC
  Administered 2022-06-23: 650 mg via INTRAVENOUS
  Filled 2022-06-23: qty 13

## 2022-06-23 MED ORDER — SODIUM CHLORIDE 0.9 % IV SOLN
10.0000 mg | Freq: Once | INTRAVENOUS | Status: AC
  Administered 2022-06-23: 10 mg via INTRAVENOUS
  Filled 2022-06-23: qty 10

## 2022-06-23 MED ORDER — OXALIPLATIN CHEMO INJECTION 100 MG/20ML
140.0000 mg | Freq: Once | INTRAVENOUS | Status: AC
  Administered 2022-06-23: 140 mg via INTRAVENOUS
  Filled 2022-06-23: qty 20

## 2022-06-23 MED ORDER — POTASSIUM CHLORIDE CRYS ER 10 MEQ PO TBCR: 10.0000 meq | EXTENDED_RELEASE_TABLET | Freq: Every day | ORAL | 0 refills | Status: DC

## 2022-06-23 NOTE — Progress Notes (Signed)
Plt 88, K 3.3. Per Janeann Merl, RN, per Dr. Tasia Catchings, proceed with treatment, add 1L IVF with 20 mEq K.

## 2022-06-23 NOTE — Assessment & Plan Note (Signed)
IV KCL 4mq x 1 today. Recommend oral KCL 141m daily x 2 weeks. Rx sent.

## 2022-06-23 NOTE — Progress Notes (Signed)
Pt here for follow up. Pt reports that she has a rash on the back of neck and scalp

## 2022-06-23 NOTE — Progress Notes (Signed)
Hematology/Oncology Progress note [phone] [phone]            Patient Care Team: Steele Sizer, MD as PCP - General (Family Medicine) Sharmaine Base, MD as Referring Physician (Psychiatry) Bjorn Loser, MD as Consulting Physician (Urology) Germaine Pomfret, [facility] (Pharmacist) Clent Jacks, RN as Oncology Nurse Navigator  REFERRING PROVIDER: Steele Sizer, MD   ASSESSMENT & PLAN:   Rectal cancer [facility]) Stage III. pT3 pN1b cM0, s/p APR. Labs reviewed and discussed with patient Proceed with cycle 2 FOLFOX.  She will return on day 4 for pump discontinuation  Encounter for antineoplastic chemotherapy Chemotherapy plan as listed above  Hypokalemia IV KCL 60mq x 1 today. Recommend oral KCL 141m daily x 2 weeks. Rx sent.   Elevated serum creatinine Encourage oral hydration.  She will IVF 1L NS x 1 today Additional IVF NS 1L on D4 with pump dc.   No orders of the defined types were placed in this encounter.   All questions were answered. The patient knows to call the clinic with any problems, questions or concerns.  ZhEarlie ServerMD, PhD [facility] 06/23/2022      CHIEF COMPLAINTS/REASON FOR VISIT:  Evaluation of rectal cancer.  HISTORY OF PRESENTING ILLNESS:   April HUSHs a  89.o.  female presents for treatment of rectal cancer Oncology History  [place] [place] Imaging   MRI PELVIS WITHOUT CONTRAST- By imaging, rectal cancer stage:  T1/T2, N0, Mx    03/02/2022 Imaging   CT CHEST AND ABDOMEN WITH CONTRAST 1. No convincing evidence of metastatic disease within the chest or abdomen. 2. Atrophic left kidney with multifocal renal scarring and cortical calcifications as well as nonobstructive renal stones measuring up to 5 mm. 3. Prominent left-sided predominant retroperitoneal lymph nodes measuring up to 8 mm near the level of the renal hilum, overall decreased in size dating  back to CT September 19, 2018 and favored reactive related to left renal inflammation. 4.  Aortic Atherosclerosis (ICD10-I70.0).   03/27/2022 Genetic Testing    [facility] CustomNext+RNA cancer panel found no pathogenic mutations.    04/21/2022 Initial Diagnosis   Rectal cancer - baseline CEA 3.6 -02/09/2022, patient had colonoscopy which showed renal mass 10 cm from anal verge.  5 mm polyp in ascending colon.  Removed and retrieved. Pathology showed rectal adenocarcinoma.  The polyp in the ascending colon is a tubular adenoma. -04/21/2022, patient underwent robotic assisted ultralow anterior resection. Pathology showed moderately differentiated adenocarcinoma, 4.5 cm in maximal extent, with focal extension through muscularis propria into perirectal soft tissue.  3 lymph nodes positive for metastatic carcinoma.  Negative margin.  pT3 pN1b, MSI stable.   05/03/2022 Cancer Staging   Staging form: Colon and Rectum, AJCC 8th Edition - Pathologic stage from 05/03/2022: Stage IIIB (pT3, pN1b, cM0) - Signed by YuEarlie ServerMD on 05/03/2022 Stage prefix: Initial diagnosis   05/11/2022 Miscellaneous   Medi port placed by Dr.White   05/26/2022 -  Chemotherapy   FOLFOX every 2 weeks.    Patient has bipolar and schizophrenia..  Patient is married and lives at home with her husband and son.   Patient is here by herself today Tolerates FOLFOX chemotherapy.Denies fever, chills, nausea, vomiting, diarrhea, chest pain, shortness of breath, abdominal pain, urinary symptoms, lower extremity swelling.    Review of Systems  Constitutional:  Negative for appetite change, chills, fatigue and fever.  HENT:   Negative for hearing loss and voice change.  Eyes:  Negative for eye problems.  Respiratory:  Negative for chest tightness and cough.   Cardiovascular:  Negative for chest pain.  Gastrointestinal:  Negative for abdominal distention, abdominal pain and blood in stool.  Endocrine: Negative for hot flashes.   Genitourinary:  Negative for difficulty urinating and frequency.   Musculoskeletal:  Negative for arthralgias.  Skin:  Negative for itching and rash.  Neurological:  Negative for extremity weakness.  Hematological:  Negative for adenopathy.  Psychiatric/Behavioral:  Negative for confusion.     MEDICAL HISTORY:  Past Medical History:  Diagnosis Date  . Allergy    pollen  . Anxiety   . Arthritis    right hip  . Bipolar 1 disorder ([place])   . Cancer ([place])    rectal  . Chronic kidney disease   . COPD (chronic obstructive pulmonary disease) ([place])   . Depression   . Family history of adverse reaction to anesthesia    grand father had a stroke during anesthesia  . Family history of breast cancer   . Family history of colon cancer   . Family history of uterine cancer   . GERD (gastroesophageal reflux disease)   . History of kidney stones   . Hyperlipidemia   . Hypertension   . Hypothyroidism   . Panic attack   . Pneumonia   . Psoriasis   . Sleep apnea 08/11/2021   No CPAP  . Type 2 diabetes mellitus with microalbuminuria, without long-term current use of insulin ([place]) 06/24/2019    SURGICAL HISTORY: Past Surgical History:  Procedure Laterality Date  . BREAST BIOPSY Left 12/14/2021   [place] bx, venus marker, path pending  . CESAREAN SECTION    . COLONOSCOPY WITH PROPOFOL N/A 02/09/2022   Procedure: COLONOSCOPY WITH PROPOFOL;  Surgeon: Lin Landsman, MD;  Location: [facility];  Service: Endoscopy;  Laterality: N/A;  sleep apnea  . CYSTOSCOPY W/ RETROGRADES Left 11/08/2018   Procedure: CYSTOSCOPY WITH RETROGRADE PYELOGRAM;  Surgeon: Billey Co, MD;  Location: [facility];  Service: Urology;  Laterality: Left;  . CYSTOSCOPY/URETEROSCOPY/HOLMIUM LASER/STENT PLACEMENT Left 11/08/2018   Procedure: CYSTOSCOPY/URETEROSCOPY/HOLMIUM LASER/STENT PLACEMENT;  Surgeon: Billey Co, MD;  Location: [facility];  Service: Urology;  Laterality: Left;  . IR IMAGING GUIDED PORT  INSERTION  05/11/2022  . MOUTH SURGERY     wisdom teeth extraction  . MOUTH SURGERY     teeth removal  . POLYPECTOMY N/A 02/09/2022   Procedure: POLYPECTOMY;  Surgeon: Lin Landsman, MD;  Location: [facility];  Service: Endoscopy;  Laterality: N/A;  . XI ROBOTIC ASSISTED LOWER ANTERIOR RESECTION N/A 04/21/2022   Procedure: XI ROBOTIC ASSISTED LOWER ANTERIOR RESECTION WITH COLOSTOMY, BILATERAL TAP BLOCK, ASSESSMENT OF TISSUE PERFUSSION WITH FIREFLY INJECTION;  Surgeon: Ileana Roup, MD;  Location: [facility];  Service: General;  Laterality: N/A;    SOCIAL HISTORY: Social History   Socioeconomic History  . Marital status: Married    Spouse name: Montine Circle  . Number of children: 1  . Years of education: 41  . Highest education level: High school graduate  Occupational History  . Occupation: unemployed    Comment: disabled  Tobacco Use  . Smoking status: Every Day    Packs/day: 0.25    Years: 34.00    Total pack years: 8.50    Types: Cigarettes    Start date: 05/13/1985  . Smokeless tobacco: Never  . Tobacco comments:    1 PPD 04/20/223  Vaping Use  .  Vaping Use: Former  . Start date: 05/25/2018  . Quit date: 09/24/2018  Substance and Sexual Activity  . Alcohol use: Not Currently    Alcohol/week: 4.0 standard drinks of alcohol    Types: 4 Glasses of wine per week    Comment: quit ETOH in Feb. 2023  . Drug use: Not Currently    Types: Marijuana    Comment: 2 days ago  . Sexual activity: Not Currently    Birth control/protection: Post-menopausal  Other Topics Concern  . Not on file  Social History Narrative   Lives with husband and son    Social Determinants of Health   Financial Resource Strain: Medium Risk (12/02/2021)   Overall Financial Resource Strain (CARDIA)   . Difficulty of Paying Living Expenses: Somewhat hard  Food Insecurity: No Food Insecurity (12/02/2021)   Hunger Vital Sign   . Worried About Charity fundraiser in the Last Year:  Never true   . Ran Out of Food in the Last Year: Never true  Transportation Needs: Unmet Transportation Needs (12/02/2021)   PRAPARE - Transportation   . Lack of Transportation (Medical): Yes   . Lack of Transportation (Non-Medical): Yes  Physical Activity: Inactive (12/02/2021)   Exercise Vital Sign   . Days of Exercise per Week: 0 days   . Minutes of Exercise per Session: 0 min  Stress: No Stress Concern Present (12/02/2021)   [place]   . Feeling of Stress : Not at all  Social Connections: Moderately Integrated (12/02/2021)   Social Connection and Isolation Panel [NHANES]   . Frequency of Communication with Friends and Family: More than three times a week   . Frequency of Social Gatherings with Friends and Family: Twice a week   . Attends Religious Services: More than 4 times per year   . Active Member of Clubs or Organizations: No   . Attends Archivist Meetings: Never   . Marital Status: Married  Human resources officer Violence: Not At Risk (07/07/2021)   Humiliation, Afraid, Rape, and Kick questionnaire   . Fear of Current or Ex-Partner: No   . Emotionally Abused: No   . Physically Abused: No   . Sexually Abused: No    FAMILY HISTORY: Family History  Problem Relation Age of Onset  . Depression Mother   . Anxiety disorder Mother   . Diabetes Mother   . Hypertension Mother   . Hyperlipidemia Mother   . Cancer Mother   . Uterine cancer Mother 67  . Cervical cancer Mother 50  . Colon cancer Father   . Depression Brother   . Anxiety disorder Brother   . Cancer Maternal Aunt        unk types  . Diabetes Mellitus II Maternal Grandmother   . Hypercholesterolemia Maternal Grandmother   . Breast cancer Maternal Grandmother   . Cancer Paternal Grandmother   . Diabetes Paternal Grandmother   . Melanoma Paternal Grandmother   . Stomach cancer Paternal Grandmother     ALLERGIES:  is allergic to metformin  and related, nsaids, perphenazine, sulfa antibiotics, abilify [aripiprazole], and penicillins.  MEDICATIONS:  Current Outpatient Medications  Medication Sig Dispense Refill  . acetaminophen (TYLENOL) 325 MG tablet Take 650 mg by mouth every 6 (six) hours as needed for headache (pain).    Marland Kitchen albuterol (VENTOLIN HFA) 108 (90 Base) MCG/ACT inhaler Inhale 2 puffs into the lungs every 6 (six) hours as needed for wheezing or shortness of breath.  1 each 5  . amantadine (SYMMETREL) 100 MG capsule Take 100 mg by mouth 2 (two) times daily.    . blood glucose meter kit and supplies Dispense based on patient and insurance preference. Use up to four times daily as directed. (FOR ICD-10 E10.9, E11.9). 1 each 0  . Budeson-Glycopyrrol-Formoterol (BREZTRI AEROSPHERE) 160-9-4.8 MCG/ACT AERO Inhale 2 puffs into the lungs in the morning and at bedtime. 5.9 g 11  . buPROPion (WELLBUTRIN XL) 150 MG 24 hr tablet Take 150 mg by mouth every morning.    . Cholecalciferol (VITAMIN D-3) 125 MCG (5000 UT) TABS Take 5,000 Units by mouth daily. (Patient not taking: Reported on 06/19/2022) 30 tablet 1  . FARXIGA 10 MG TABS tablet TAKE 1 TABLET BY MOUTH DAILY BEFORE BREAKFAST. 30 tablet 2  . gabapentin (NEURONTIN) 300 MG capsule Take 300 mg by mouth 2 (two) times daily.    Marland Kitchen guaiFENesin (MUCINEX) 600 MG 12 hr tablet Take 2 tablets (1,200 mg total) by mouth 2 (two) times daily as needed for to loosen phlegm or cough. (Patient not taking: Reported on 06/19/2022)    . hydrOXYzine (VISTARIL) 50 MG capsule Take 50 mg by mouth 3 (three) times daily as needed for anxiety.    Marland Kitchen icosapent Ethyl (VASCEPA) 1 g capsule Take 2 capsules (2 g total) by mouth 2 (two) times daily. 120 capsule 3  . levothyroxine (SYNTHROID) 75 MCG tablet TAKE 1 TABLET BY MOUTH DAILY BEFORE BREAKFAST 90 tablet 0  . lidocaine-prilocaine (EMLA) cream Apply to affected area once 30 g 3  . loratadine (CLARITIN) 10 MG tablet Take 10 mg by mouth every morning.    Marland Kitchen  losartan (COZAAR) 25 MG tablet Take 1 tablet (25 mg total) by mouth daily. 30 tablet 3  . nicotine (NICODERM CQ - DOSED IN MG/24 HR) 7 mg/24hr patch Place 1 patch (7 mg total) onto the skin daily. (Patient not taking: Reported on 06/09/2022) 28 patch 1  . NON FORMULARY Pt uses a cpap nightly (Patient not taking: Reported on 06/19/2022)    . nystatin (MYCOSTATIN) 100000 UNIT/ML suspension Take 5 mLs (500,000 Units total) by mouth 4 (four) times daily. Swish and spit 60 mL 0  . ondansetron (ZOFRAN) 8 MG tablet Take 1 tablet (8 mg total) by mouth 2 (two) times daily as needed for refractory nausea / vomiting. Start on day 3 after chemotherapy. 30 tablet 1  . paliperidone (INVEGA SUSTENNA) 234 MG/1.5ML SUSY injection Inject 234 mg into the muscle every 30 (thirty) days. On or about the 14th of each month    . pantoprazole (PROTONIX) 40 MG tablet Take 1 tablet (40 mg total) by mouth daily with breakfast. 90 tablet 0  . Pediatric Multivit-Minerals-C (CHEWABLES MULTIVITAMIN PO) Take 1 tablet by mouth daily.    . prochlorperazine (COMPAZINE) 10 MG tablet Take 1 tablet (10 mg total) by mouth every 6 (six) hours as needed for nausea or vomiting. 60 tablet 3  . rosuvastatin (CRESTOR) 40 MG tablet Take 1 tablet (40 mg total) by mouth daily. (Patient taking differently: Take 40 mg by mouth every morning.) 30 tablet 3  . sertraline (ZOLOFT) 100 MG tablet Take 200 mg by mouth every morning.    Marland Kitchen Spacer/Aero-Holding Chambers (AEROCHAMBER MV) inhaler Use as instructed 1 each 0   No current facility-administered medications for this visit.     PHYSICAL EXAMINATION: ECOG PERFORMANCE STATUS: 0 - Asymptomatic There were no vitals filed for this visit.  There were no vitals filed for this  visit.   Physical Exam Constitutional:      General: She is not in acute distress. HENT:     Head: Normocephalic and atraumatic.  Eyes:     General: No scleral icterus. Cardiovascular:     Rate and Rhythm: Normal rate and  regular rhythm.     Heart sounds: Normal heart sounds.  Pulmonary:     Effort: Pulmonary effort is normal. No respiratory distress.     Breath sounds: No wheezing.  Abdominal:     General: Bowel sounds are normal. There is no distension.     Palpations: Abdomen is soft.  Musculoskeletal:        General: No deformity. Normal range of motion.     Cervical back: Normal range of motion and neck supple.  Skin:    General: Skin is warm and dry.     Findings: No erythema or rash.  Neurological:     Mental Status: She is alert and oriented to person, place, and time. Mental status is at baseline.     Cranial Nerves: No cranial nerve deficit.     Coordination: Coordination normal.  Psychiatric:        Mood and Affect: Mood normal.    LABORATORY DATA:  I have reviewed the data as listed    Latest Ref Rng & Units 06/09/2022    8:24 AM 06/02/2022    8:18 AM 05/26/2022    8:41 AM  CBC  WBC 4.0 - 10.5 K/uL 8.0  9.5  12.2   Hemoglobin 12.0 - 15.0 g/dL 14.6  14.2  14.5   Hematocrit 36.0 - 46.0 % 43.8  42.4  43.7   Platelets 150 - 400 K/uL 123  143  140       Latest Ref Rng & Units 06/09/2022    8:24 AM 06/02/2022    8:18 AM 05/26/2022    8:41 AM  CMP  Glucose 70 - 99 mg/dL 225  230  277   BUN 6 - 20 mg/dL '10  18  17   ' Creatinine 0.44 - 1.00 mg/dL 0.86  0.96  0.90   Sodium 135 - 145 mmol/L 136  136  134   Potassium 3.5 - 5.1 mmol/L 3.7  4.1  4.2   Chloride 98 - 111 mmol/L 102  101  101   CO2 22 - 32 mmol/L '26  26  27   ' Calcium 8.9 - 10.3 mg/dL 8.7  8.9  8.8   Total Protein 6.5 - 8.1 g/dL 7.1  7.0  7.2   Total Bilirubin 0.3 - 1.2 mg/dL 0.3  0.2  0.3   Alkaline Phos 38 - 126 U/L 87  87  104   AST 15 - 41 U/L '20  18  20   ' ALT 0 - 44 U/L '16  14  16      ' Iron/TIBC/Ferritin/ %Sat No results found for: "IRON", "TIBC", "FERRITIN", "IRONPCTSAT"    RADIOGRAPHIC STUDIES: I have personally reviewed the radiological images as listed and agreed with the findings in the report. No results  found.

## 2022-06-23 NOTE — Assessment & Plan Note (Signed)
Chemotherapy plan as listed above 

## 2022-06-23 NOTE — Patient Instructions (Signed)
[facility] [place]  Discharge Instructions: Thank you for choosing [facility] to provide your oncology and hematology care.  If you have a lab appointment with the Cancer Center, please go directly to the Cancer Center and check in at the registration area.  Wear comfortable clothing and clothing appropriate for easy access to any Portacath or PICC line.   We strive to give you quality time with your provider. You may need to reschedule your appointment if you arrive late (15 or more minutes).  Arriving late affects you and other patients whose appointments are after yours.  Also, if you miss three or more appointments without notifying the office, you may be dismissed from the clinic at the provider's discretion.      For prescription refill requests, have your pharmacy contact our office and allow 72 hours for refills to be completed.    Today you received the following chemotherapy and/or immunotherapy agents: Oxaliplatin, Leucovorin, 5FU      To help prevent nausea and vomiting after your treatment, we encourage you to take your nausea medication as directed.  BELOW ARE SYMPTOMS THAT SHOULD BE REPORTED IMMEDIATELY: *FEVER GREATER THAN 100.4 F (38 C) OR HIGHER *CHILLS OR SWEATING *NAUSEA AND VOMITING THAT IS NOT CONTROLLED WITH YOUR NAUSEA MEDICATION *UNUSUAL SHORTNESS OF BREATH *UNUSUAL BRUISING OR BLEEDING *URINARY PROBLEMS (pain or burning when urinating, or frequent urination) *BOWEL PROBLEMS (unusual diarrhea, constipation, pain near the anus) TENDERNESS IN MOUTH AND THROAT WITH OR WITHOUT PRESENCE OF ULCERS (sore throat, sores in mouth, or a toothache) UNUSUAL RASH, SWELLING OR PAIN  UNUSUAL VAGINAL DISCHARGE OR ITCHING   Items with * indicate a potential emergency and should be followed up as soon as possible or go to the Emergency Department if any problems should occur.  Please show the CHEMOTHERAPY ALERT CARD or IMMUNOTHERAPY ALERT  CARD at check-in to the Emergency Department and triage nurse.  Should you have questions after your visit or need to cancel or reschedule your appointment, please contact [facility]  [phone] and follow the prompts.  Office hours are 8:00 a.m. to 4:30 p.m. Monday - Friday. Please note that voicemails left after 4:00 p.m. may not be returned until the following business day.  We are closed weekends and major holidays. You have access to a nurse at all times for urgent questions. Please call the main number to the clinic [phone] and follow the prompts.  For any non-urgent questions, you may also contact your provider using MyChart. We now offer e-Visits for anyone 18 and older to request care online for non-urgent symptoms. For details visit [email or web address].   Also download the MyChart app! Go to the app store, search "MyChart", open the app, select [place], and log in with your MyChart username and password.  Masks are optional in the cancer centers. If you would like for your care team to wear a mask while they are taking care of you, please let them know. For doctor visits, patients may have with them one support person who is at least 50 years old. At this time, visitors are not allowed in the infusion area.   

## 2022-06-23 NOTE — Assessment & Plan Note (Signed)
Encourage oral hydration.  She will IVF 1L NS x 1 today Additional IVF NS 1L on D4 with pump dc.

## 2022-06-23 NOTE — Assessment & Plan Note (Addendum)
Stage III. pT3 pN1b cM0, s/p APR. Labs reviewed and discussed with patient Proceed with cycle 3 FOLFOX.  She will return on day 4 for pump dc

## 2022-06-24 ENCOUNTER — Encounter: Payer: Self-pay | Admitting: Oncology

## 2022-06-24 ENCOUNTER — Encounter: Payer: Self-pay | Admitting: Nurse Practitioner

## 2022-06-24 DIAGNOSIS — D696 Thrombocytopenia, unspecified: Secondary | ICD-10-CM | POA: Insufficient documentation

## 2022-06-24 MED ORDER — CAPECITABINE 500 MG PO TABS: 1000.0000 mg | ORAL_TABLET | Freq: Two times a day (BID) | ORAL | 0 refills | Status: DC

## 2022-06-24 MED ORDER — CAPECITABINE 150 MG PO TABS: 300.0000 mg | ORAL_TABLET | Freq: Two times a day (BID) | ORAL | 0 refills | Status: DC

## 2022-06-24 NOTE — Assessment & Plan Note (Signed)
Chemotherapy-induced.  Platelet count is above 75,000.   Proceed with treatment

## 2022-06-25 ENCOUNTER — Telehealth: Payer: Self-pay | Admitting: Pharmacist

## 2022-06-25 DIAGNOSIS — C20 Malignant neoplasm of rectum: Secondary | ICD-10-CM

## 2022-06-25 NOTE — Telephone Encounter (Signed)
Oral Oncology Pharmacist Encounter  Received new prescription for Xeloda (capecitabine) for the treatment of stage III rectal cancer in conjunction with radiation, planned duration until disease progression or unacceptable drug toxicity. Patient has one more cycle of FOLFOX to be given in 7/14, following this she will begin radiation.   CMP from 06/23/22 assessed, SCr elevated at 1.'23mg'$ /dL, CrCl ~28m/min. This was an acute increase in her SCr, she was given IV hydration and scheduled for additional hydratation. Expect this to decrease with the next lab check, will continue to monitor to ensure dose does not need to be reduced. Prescription dose and frequency assessed.   Current medication list in Epic reviewed, one DDIs with capecitabine identified: Pantoprazole: Proton Pump Inhibitors (PPI) may diminish the therapeutic effect of capecitabine, varying information on the clinical impact. Recommend evaluating the need for a PPI/acid suppression. If acid suppression is needed, attempt switching to a H2 antagonist (eg, famotidine) if possible.  Evaluated chart and no patient barriers to medication adherence identified.   [facility].  Oral Oncology Clinic will continue to follow for insurance authorization, copayment issues, initial counseling and start date.   ADarl Pikes PharmD, BCPS, BCOP, CPP Hematology/Oncology Clinical Pharmacist Practitioner [place]/[facility]/AP [phone] 06/25/2022 9:41 PM

## 2022-06-26 ENCOUNTER — Other Ambulatory Visit (HOSPITAL_COMMUNITY): Payer: Self-pay

## 2022-06-26 ENCOUNTER — Inpatient Hospital Stay: Payer: Medicare Other | Attending: Oncology

## 2022-06-26 ENCOUNTER — Telehealth: Payer: Self-pay | Admitting: Pharmacy Technician

## 2022-06-26 ENCOUNTER — Telehealth: Payer: Self-pay

## 2022-06-26 VITALS — BP 114/69 | HR 89 | Temp 97.1°F | Resp 16

## 2022-06-26 DIAGNOSIS — J449 Chronic obstructive pulmonary disease, unspecified: Secondary | ICD-10-CM | POA: Insufficient documentation

## 2022-06-26 DIAGNOSIS — F319 Bipolar disorder, unspecified: Secondary | ICD-10-CM | POA: Diagnosis not present

## 2022-06-26 DIAGNOSIS — D122 Benign neoplasm of ascending colon: Secondary | ICD-10-CM | POA: Diagnosis not present

## 2022-06-26 DIAGNOSIS — Z79899 Other long term (current) drug therapy: Secondary | ICD-10-CM | POA: Diagnosis not present

## 2022-06-26 DIAGNOSIS — Z87442 Personal history of urinary calculi: Secondary | ICD-10-CM | POA: Insufficient documentation

## 2022-06-26 DIAGNOSIS — R81 Glycosuria: Secondary | ICD-10-CM | POA: Insufficient documentation

## 2022-06-26 DIAGNOSIS — Z7984 Long term (current) use of oral hypoglycemic drugs: Secondary | ICD-10-CM | POA: Diagnosis not present

## 2022-06-26 DIAGNOSIS — K219 Gastro-esophageal reflux disease without esophagitis: Secondary | ICD-10-CM | POA: Diagnosis not present

## 2022-06-26 DIAGNOSIS — F17210 Nicotine dependence, cigarettes, uncomplicated: Secondary | ICD-10-CM | POA: Insufficient documentation

## 2022-06-26 DIAGNOSIS — N200 Calculus of kidney: Secondary | ICD-10-CM | POA: Diagnosis not present

## 2022-06-26 DIAGNOSIS — E039 Hypothyroidism, unspecified: Secondary | ICD-10-CM | POA: Diagnosis not present

## 2022-06-26 DIAGNOSIS — D6959 Other secondary thrombocytopenia: Secondary | ICD-10-CM | POA: Diagnosis not present

## 2022-06-26 DIAGNOSIS — Z800 Family history of malignant neoplasm of digestive organs: Secondary | ICD-10-CM | POA: Insufficient documentation

## 2022-06-26 DIAGNOSIS — E1122 Type 2 diabetes mellitus with diabetic chronic kidney disease: Secondary | ICD-10-CM | POA: Diagnosis not present

## 2022-06-26 DIAGNOSIS — E1142 Type 2 diabetes mellitus with diabetic polyneuropathy: Secondary | ICD-10-CM | POA: Insufficient documentation

## 2022-06-26 DIAGNOSIS — T451X5A Adverse effect of antineoplastic and immunosuppressive drugs, initial encounter: Secondary | ICD-10-CM | POA: Diagnosis not present

## 2022-06-26 DIAGNOSIS — I129 Hypertensive chronic kidney disease with stage 1 through stage 4 chronic kidney disease, or unspecified chronic kidney disease: Secondary | ICD-10-CM | POA: Diagnosis not present

## 2022-06-26 DIAGNOSIS — E785 Hyperlipidemia, unspecified: Secondary | ICD-10-CM | POA: Diagnosis not present

## 2022-06-26 DIAGNOSIS — Z5111 Encounter for antineoplastic chemotherapy: Secondary | ICD-10-CM | POA: Diagnosis not present

## 2022-06-26 DIAGNOSIS — C20 Malignant neoplasm of rectum: Secondary | ICD-10-CM

## 2022-06-26 MED ORDER — SODIUM CHLORIDE 0.9 % IV SOLN
Freq: Once | INTRAVENOUS | Status: AC
  Filled 2022-06-26: qty 250

## 2022-06-26 MED ORDER — CAPECITABINE 500 MG PO TABS
1000.0000 mg | ORAL_TABLET | Freq: Two times a day (BID) | ORAL | 0 refills | Status: DC
  Filled 2022-06-26 – 2022-07-11 (×2): qty 112, 28d supply, fill #0

## 2022-06-26 MED ORDER — HEPARIN SOD (PORK) LOCK FLUSH 100 UNIT/ML IV SOLN
INTRAVENOUS | Status: AC
  Administered 2022-06-26: 500 [IU]
  Filled 2022-06-26: qty 5

## 2022-06-26 MED ORDER — CAPECITABINE 150 MG PO TABS
300.0000 mg | ORAL_TABLET | Freq: Two times a day (BID) | ORAL | 0 refills | Status: DC
  Filled 2022-06-26 – 2022-07-11 (×2): qty 112, 28d supply, fill #0

## 2022-06-26 NOTE — Patient Instructions (Signed)

## 2022-06-26 NOTE — Telephone Encounter (Addendum)
Oral Oncology Patient Advocate Encounter  After completing a benefits investigation, prior authorization for Xeloda is not required at this time through [facility].  Patient's copay is $41.19 (500 mg) Patient's copay is $34.83 (150 mg)  Dennison Nancy [place] Patient [place] Phone [phone] Fax [phone] 06/26/2022 8:47 AM

## 2022-06-26 NOTE — Telephone Encounter (Signed)
-----   Message from Darl Pikes, [place] sent at 06/25/2022  9:42 PM EDT ----- We will get to work on the Xeloda access and make sure she has it in time to take along with the start of her radiation!  -Alyson  ----- Message ----- From: [name], MD Sent: 06/24/2022   1:35 PM EDT To: Sheliah Hatch, CPhT; Evelina Dun, RN; #  Clearnce Sorrel and Bethena Roys, I plan to start patient on Xarelto 2 weeks after her cycle 4 FOLFOX -scheduled on 07/07/2022.  I am away and she will see covering provider. She should also start  concurrent radiation around the same time. Xarelto prescription ordered.825 mg/m2 twice daily, please check her coverage and arrange medication to be delivered to her.  She has appointment to see me when she starts concurrent chemoradiation.  Benjamine Mola and Amy, if patient proceed with FOLFOX on 07/07/2022, please notify radiation oncology team that she will need to see Dr. Baruch Gouty to start radiation around 07/21/2022.  Thank you

## 2022-06-30 ENCOUNTER — Encounter (HOSPITAL_COMMUNITY): Payer: Self-pay | Admitting: Nurse Practitioner

## 2022-07-05 ENCOUNTER — Other Ambulatory Visit: Payer: Self-pay | Admitting: Family Medicine

## 2022-07-05 DIAGNOSIS — E1129 Type 2 diabetes mellitus with other diabetic kidney complication: Secondary | ICD-10-CM

## 2022-07-05 DIAGNOSIS — K219 Gastro-esophageal reflux disease without esophagitis: Secondary | ICD-10-CM

## 2022-07-05 DIAGNOSIS — E1169 Type 2 diabetes mellitus with other specified complication: Secondary | ICD-10-CM

## 2022-07-05 DIAGNOSIS — I10 Essential (primary) hypertension: Secondary | ICD-10-CM

## 2022-07-05 DIAGNOSIS — E032 Hypothyroidism due to medicaments and other exogenous substances: Secondary | ICD-10-CM

## 2022-07-07 ENCOUNTER — Encounter (HOSPITAL_COMMUNITY): Payer: Self-pay | Admitting: Nurse Practitioner

## 2022-07-07 ENCOUNTER — Encounter: Payer: Self-pay | Admitting: Oncology

## 2022-07-07 ENCOUNTER — Inpatient Hospital Stay (HOSPITAL_BASED_OUTPATIENT_CLINIC_OR_DEPARTMENT_OTHER): Payer: Medicare Other | Admitting: Oncology

## 2022-07-07 ENCOUNTER — Inpatient Hospital Stay: Payer: Medicare Other

## 2022-07-07 ENCOUNTER — Other Ambulatory Visit: Payer: Self-pay | Admitting: Gastroenterology

## 2022-07-07 VITALS — BP 135/79 | HR 88 | Temp 96.9°F | Resp 16 | Wt 134.4 lb

## 2022-07-07 DIAGNOSIS — Z5111 Encounter for antineoplastic chemotherapy: Secondary | ICD-10-CM | POA: Diagnosis not present

## 2022-07-07 DIAGNOSIS — C20 Malignant neoplasm of rectum: Secondary | ICD-10-CM

## 2022-07-07 DIAGNOSIS — T451X5A Adverse effect of antineoplastic and immunosuppressive drugs, initial encounter: Secondary | ICD-10-CM | POA: Diagnosis not present

## 2022-07-07 DIAGNOSIS — D6959 Other secondary thrombocytopenia: Secondary | ICD-10-CM

## 2022-07-07 DIAGNOSIS — Z7189 Other specified counseling: Secondary | ICD-10-CM

## 2022-07-07 LAB — COMPREHENSIVE METABOLIC PANEL
ALT: 14 U/L (ref 0–44)
AST: 18 U/L (ref 15–41)
Albumin: 3.8 g/dL (ref 3.5–5.0)
Alkaline Phosphatase: 89 U/L (ref 38–126)
Anion gap: 10 (ref 5–15)
BUN: 15 mg/dL (ref 6–20)
CO2: 27 mmol/L (ref 22–32)
Calcium: 9 mg/dL (ref 8.9–10.3)
Chloride: 102 mmol/L (ref 98–111)
Creatinine, Ser: 1.08 mg/dL — ABNORMAL HIGH (ref 0.44–1.00)
GFR, Estimated: >60 mL/min (ref >60)
Glucose, Bld: 196 mg/dL — ABNORMAL HIGH (ref 70–99)
Potassium: 4 mmol/L (ref 3.5–5.1)
Sodium: 139 mmol/L (ref 135–145)
Total Bilirubin: 0.6 mg/dL (ref 0.3–1.2)
Total Protein: 6.7 g/dL (ref 6.5–8.1)

## 2022-07-07 LAB — CBC WITH DIFFERENTIAL/PLATELET
Abs Immature Granulocytes: 0.01 10*3/uL (ref 0.00–0.07)
Basophils Absolute: 0.1 10*3/uL (ref 0.0–0.1)
Basophils Relative: 1 %
Eosinophils Absolute: 0.2 10*3/uL (ref 0.0–0.5)
Eosinophils Relative: 3 %
HCT: 42.8 % (ref 36.0–46.0)
Hemoglobin: 14.6 g/dL (ref 12.0–15.0)
Immature Granulocytes: 0 %
Lymphocytes Relative: 30 %
Lymphs Abs: 1.8 10*3/uL (ref 0.7–4.0)
MCH: 32.3 pg (ref 26.0–34.0)
MCHC: 34.1 g/dL (ref 30.0–36.0)
MCV: 94.7 fL (ref 80.0–100.0)
Monocytes Absolute: 0.5 10*3/uL (ref 0.1–1.0)
Monocytes Relative: 9 %
Neutro Abs: 3.6 10*3/uL (ref 1.7–7.7)
Neutrophils Relative %: 57 %
Platelets: 87 10*3/uL — ABNORMAL LOW (ref 150–400)
RBC: 4.52 MIL/uL (ref 3.87–5.11)
RDW: 15.2 % (ref 11.5–15.5)
WBC: 6.2 10*3/uL (ref 4.0–10.5)
nRBC: 0 % (ref 0.0–0.2)

## 2022-07-07 MED ORDER — SODIUM CHLORIDE 0.9 % IV SOLN
10.0000 mg | Freq: Once | INTRAVENOUS | Status: AC
  Administered 2022-07-07: 10 mg via INTRAVENOUS
  Filled 2022-07-07: qty 10

## 2022-07-07 MED ORDER — DEXTROSE 5 % IV SOLN
Freq: Once | INTRAVENOUS | Status: AC
  Filled 2022-07-07: qty 250

## 2022-07-07 MED ORDER — OXALIPLATIN CHEMO INJECTION 100 MG/20ML
140.0000 mg | Freq: Once | INTRAVENOUS | Status: AC
  Administered 2022-07-07: 140 mg via INTRAVENOUS
  Filled 2022-07-07: qty 10

## 2022-07-07 MED ORDER — SODIUM CHLORIDE 0.9 % IV SOLN
2400.0000 mg/m2 | INTRAVENOUS | Status: DC
  Administered 2022-07-07: 3850 mg via INTRAVENOUS
  Filled 2022-07-07: qty 77

## 2022-07-07 MED ORDER — PALONOSETRON HCL INJECTION 0.25 MG/5ML
0.2500 mg | Freq: Once | INTRAVENOUS | Status: AC
  Administered 2022-07-07: 0.25 mg via INTRAVENOUS
  Filled 2022-07-07: qty 5

## 2022-07-07 MED ORDER — LEUCOVORIN CALCIUM INJECTION 350 MG
650.0000 mg | Freq: Once | INTRAVENOUS | Status: AC
  Administered 2022-07-07: 650 mg via INTRAVENOUS
  Filled 2022-07-07: qty 32.5

## 2022-07-07 NOTE — Telephone Encounter (Signed)
Pt will proceed with cycle 4 of folfox today (7/14). She is scheduled to see Dr. Baruch Gouty on 7/17. Will schedule MD follow up as soon as we know when first RXT will be.   April Luna is working on [employer] Xeloda to pt prior to first Rad Tx.

## 2022-07-07 NOTE — Progress Notes (Signed)
   Hematology/Oncology Consult note [facility]  [phone] [phone]  Patient Care Team: Sowles, Krichna, MD as PCP - General (Family Medicine) Lateef, Mohammed, MD as Referring Physician (Psychiatry) MacDiarmid, Scott, MD as Consulting Physician (Urology) Fleury, Alexandre A, RPH (Pharmacist) Stanton, Kristi D, RN as Oncology Nurse Navigator   Name of the patient: April Luna  [id number]  04/19/1971   Date of visit: 07/07/22  Diagnosis- Stage III. pT3 pN1b cM0, s/p APR.  Chief complaint/ Reason for visit-on treatment assessment prior to cycle 4 of FOLFOX chemotherapy  Heme/Onc history:  Oncology History  Rectal cancer (HCC)  02/26/2022 Imaging   MRI PELVIS WITHOUT CONTRAST- By imaging, rectal cancer stage:  T1/T2, N0, Mx    03/02/2022 Imaging   CT CHEST AND ABDOMEN WITH CONTRAST 1. No convincing evidence of metastatic disease within the chest or abdomen. 2. Atrophic left kidney with multifocal renal scarring and cortical calcifications as well as nonobstructive renal stones measuring up to 5 mm. 3. Prominent left-sided predominant retroperitoneal lymph nodes measuring up to 8 mm near the level of the renal hilum, overall decreased in size dating back to CT September 19, 2018 and favored reactive related to left renal inflammation. 4.  Aortic Atherosclerosis (ICD10-I70.0).   03/27/2022 Genetic Testing    [facility] Custom[employer]+RNA cancer panel found no pathogenic mutations.    04/21/2022 Initial Diagnosis   Rectal cancer - baseline CEA 3.6 -02/09/2022, patient had colonoscopy which showed renal mass 10 cm from anal verge.  5 mm polyp in ascending colon.  Removed and retrieved. Pathology showed rectal adenocarcinoma.  The polyp in the ascending colon is a tubular adenoma. -04/21/2022, patient underwent robotic assisted ultralow anterior resection. Pathology showed moderately differentiated adenocarcinoma, 4.5 cm in maximal extent, with  focal extension through muscularis propria into perirectal soft tissue.  3 lymph nodes positive for metastatic carcinoma.  Negative margin.  pT3 pN1b, MSI stable.   05/03/2022 Cancer Staging   Staging form: Colon and Rectum, AJCC 8th Edition - Pathologic stage from 05/03/2022: Stage IIIB (pT3, pN1b, cM0) - Signed by Yu, Zhou, MD on 05/03/2022 Stage prefix: Initial diagnosis   05/11/2022 Miscellaneous   Medi port placed by Dr.White   05/26/2022 -  Chemotherapy   FOLFOX every 2 weeks.       Interval history-patient is tolerating chemotherapy well so far.  She has some mild baseline neuropathy in her feet secondary to her diabetes which has not worsened after starting oxaliplatin.  Denies any new complaints at this time  ECOG PS- 1 Pain scale- 0   Review of systems- Review of Systems  Constitutional:  Negative for chills, fever, malaise/fatigue and weight loss.  HENT:  Negative for congestion, ear discharge and nosebleeds.   Eyes:  Negative for blurred vision.  Respiratory:  Negative for cough, hemoptysis, sputum production, shortness of breath and wheezing.   Cardiovascular:  Negative for chest pain, palpitations, orthopnea and claudication.  Gastrointestinal:  Negative for abdominal pain, blood in stool, constipation, diarrhea, heartburn, melena, nausea and vomiting.  Genitourinary:  Negative for dysuria, flank pain, frequency, hematuria and urgency.  Musculoskeletal:  Negative for back pain, joint pain and myalgias.  Skin:  Negative for rash.  Neurological:  Positive for sensory change (Peripheral neuropathy). Negative for dizziness, tingling, focal weakness, seizures, weakness and headaches.  Endo/Heme/Allergies:  Does not bruise/bleed easily.  Psychiatric/Behavioral:  Negative for depression and suicidal ideas. The patient does not have insomnia.       Allergies  Allergen Reactions     Metformin And Related Nausea And Vomiting   Nsaids Other (See Comments)    Stage 3 CKD    Perphenazine Other (See Comments)    Tremors, muscle weakness, tongue swelling   Sulfa Antibiotics Other (See Comments)    GI distress    Abilify [Aripiprazole] Rash   Penicillins Rash    She has taken amoxicillin without problems     Past Medical History:  Diagnosis Date   Allergy    pollen   Anxiety    Arthritis    right hip   Bipolar 1 disorder (HCC)    Cancer (HCC)    rectal   Chronic kidney disease    COPD (chronic obstructive pulmonary disease) (HCC)    Depression    Family history of adverse reaction to anesthesia    grand father had a stroke during anesthesia   Family history of breast cancer    Family history of colon cancer    Family history of uterine cancer    GERD (gastroesophageal reflux disease)    History of kidney stones    Hyperlipidemia    Hypertension    Hypothyroidism    Panic attack    Pneumonia    Psoriasis    Sleep apnea 08/11/2021   No CPAP   Type 2 diabetes mellitus with microalbuminuria, without long-term current use of insulin (HCC) 06/24/2019     Past Surgical History:  Procedure Laterality Date   BREAST BIOPSY Left 12/14/2021   us bx, venus marker, path pending   CESAREAN SECTION     COLONOSCOPY WITH PROPOFOL N/A 02/09/2022   Procedure: COLONOSCOPY WITH PROPOFOL;  Surgeon: Vanga, Rohini Reddy, MD;  Location: [facility];  Service: Endoscopy;  Laterality: N/A;  sleep apnea   CYSTOSCOPY W/ RETROGRADES Left 11/08/2018   Procedure: CYSTOSCOPY WITH RETROGRADE PYELOGRAM;  Surgeon: Sninsky, Brian C, MD;  Location: [facility];  Service: Urology;  Laterality: Left;   CYSTOSCOPY/URETEROSCOPY/HOLMIUM LASER/STENT PLACEMENT Left 11/08/2018   Procedure: CYSTOSCOPY/URETEROSCOPY/HOLMIUM LASER/STENT PLACEMENT;  Surgeon: Sninsky, Brian C, MD;  Location: [facility];  Service: Urology;  Laterality: Left;   IR IMAGING GUIDED PORT INSERTION  05/11/2022   MOUTH SURGERY     wisdom teeth extraction   MOUTH SURGERY     teeth removal   POLYPECTOMY N/A  02/09/2022   Procedure: POLYPECTOMY;  Surgeon: Vanga, Rohini Reddy, MD;  Location: [facility];  Service: Endoscopy;  Laterality: N/A;   XI ROBOTIC ASSISTED LOWER ANTERIOR RESECTION N/A 04/21/2022   Procedure: XI ROBOTIC ASSISTED LOWER ANTERIOR RESECTION WITH COLOSTOMY, BILATERAL TAP BLOCK, ASSESSMENT OF TISSUE PERFUSSION WITH FIREFLY INJECTION;  Surgeon: White, Christopher M, MD;  Location: [facility];  Service: General;  Laterality: N/A;    Social History   Socioeconomic History   Marital status: Married    Spouse name: Benjamin Lee Gray   Number of children: 1   Years of education: 12   Highest education level: High school graduate  Occupational History   Occupation: unemployed    Comment: disabled  Tobacco Use   Smoking status: Every Day    Packs/day: 0.25    Years: 34.00    Total pack years: 8.50    Types: Cigarettes    Start date: 05/13/1985   Smokeless tobacco: Never   Tobacco comments:    1 PPD 04/20/223  Vaping Use   Vaping Use: Former   Start date: 05/25/2018   Quit date: 09/24/2018  Substance and Sexual Activity   Alcohol use: Not Currently    Alcohol/week:   4.0 standard drinks of alcohol    Types: 4 Glasses of wine per week    Comment: quit ETOH in Feb. 2023   Drug use: Not Currently    Types: Marijuana    Comment: 2 days ago   Sexual activity: Not Currently    Birth control/protection: Post-menopausal  Other Topics Concern   Not on file  Social History Narrative   Lives with husband and son    Social Determinants of Health   Financial Resource Strain: Medium Risk (12/02/2021)   Overall Financial Resource Strain (CARDIA)    Difficulty of Paying Living Expenses: Somewhat hard  Food Insecurity: No Food Insecurity (12/02/2021)   Hunger Vital Sign    Worried About Running Out of Food in the Last Year: Never true    Ran Out of Food in the Last Year: Never true  Transportation Needs: Unmet Transportation Needs (12/02/2021)   PRAPARE - Transportation    Lack  of Transportation (Medical): Yes    Lack of Transportation (Non-Medical): Yes  Physical Activity: Inactive (12/02/2021)   Exercise Vital Sign    Days of Exercise per Week: 0 days    Minutes of Exercise per Session: 0 min  Stress: No Stress Concern Present (12/02/2021)   [place] of Stress : Not at all  Social Connections: Moderately Integrated (12/02/2021)   Social Connection and Isolation Panel [NHANES]    Frequency of Communication with Friends and Family: More than three times a week    Frequency of Social Gatherings with Friends and Family: Twice a week    Attends Religious Services: More than 4 times per year    Active Member of [employer] or Organizations: No    Attends Archivist Meetings: Never    Marital Status: Married  Human resources officer Violence: Not At Risk (07/07/2021)   Humiliation, Afraid, Rape, and Kick questionnaire    Fear of Current or Ex-Partner: No    Emotionally Abused: No    Physically Abused: No    Sexually Abused: No    Family History  Problem Relation Age of Onset   Depression Mother    Anxiety disorder Mother    Diabetes Mother    Hypertension Mother    Hyperlipidemia Mother    Cancer Mother    Uterine cancer Mother 5   Cervical cancer Mother 62   Colon cancer Father    Depression Brother    Anxiety disorder Brother    Cancer Maternal Aunt        unk types   Diabetes Mellitus II Maternal Grandmother    Hypercholesterolemia Maternal Grandmother    Breast cancer Maternal Grandmother    Cancer Paternal Grandmother    Diabetes Paternal Grandmother    Melanoma Paternal Grandmother    Stomach cancer Paternal Grandmother      Current Outpatient Medications:    acetaminophen (TYLENOL) 325 MG tablet, Take 650 mg by mouth every 6 (six) hours as needed for headache (pain)., Disp: , Rfl:    albuterol (VENTOLIN HFA) 108 (90 Base) MCG/ACT inhaler, Inhale 2 puffs into the  lungs every 6 (six) hours as needed for wheezing or shortness of breath., Disp: 1 each, Rfl: 5   amantadine (SYMMETREL) 100 MG capsule, Take 100 mg by mouth 2 (two) times daily., Disp: , Rfl:    blood glucose meter kit and supplies, Dispense based on patient and insurance preference. Use up to four times daily as directed. (FOR ICD-10  E10.9, E11.9)., Disp: 1 each, Rfl: 0   Budeson-Glycopyrrol-Formoterol (BREZTRI AEROSPHERE) 160-9-4.8 MCG/ACT AERO, Inhale 2 puffs into the lungs in the morning and at bedtime., Disp: 5.9 g, Rfl: 11   buPROPion (WELLBUTRIN XL) 150 MG 24 hr tablet, Take 150 mg by mouth every morning., Disp: , Rfl:    capecitabine (XELODA) 150 MG tablet, Take 2 tablets (300 mg total) by mouth 2 (two) times daily after a meal. Take along with 500mg tablets. Take Monday- Friday. Take only on days of radiation., Disp: 112 tablet, Rfl: 0   capecitabine (XELODA) 500 MG tablet, Take 2 tablets (1,000 mg total) by mouth 2 (two) times daily after a meal. Take along with 150mg tablets. Take Monday- Friday. Take only on days of radiation., Disp: 112 tablet, Rfl: 0   Cholecalciferol (VITAMIN D-3) 125 MCG (5000 UT) TABS, Take 5,000 Units by mouth daily., Disp: 30 tablet, Rfl: 1   FARXIGA 10 MG TABS tablet, TAKE 1 TABLET BY MOUTH DAILY BEFORE BREAKFAST., Disp: 30 tablet, Rfl: 2   gabapentin (NEURONTIN) 300 MG capsule, Take 300 mg by mouth 2 (two) times daily., Disp: , Rfl:    guaiFENesin (MUCINEX) 600 MG 12 hr tablet, Take 2 tablets (1,200 mg total) by mouth 2 (two) times daily as needed for to loosen phlegm or cough., Disp: , Rfl:    hydrocortisone cream 0.5 %, Apply 1 Application topically 2 (two) times daily as needed for itching., Disp: 30 g, Rfl: 1   Hydrocortisone, Perianal, 1 % CREA, Apply topically., Disp: , Rfl:    hydrOXYzine (VISTARIL) 50 MG capsule, Take 50 mg by mouth 3 (three) times daily as needed for anxiety., Disp: , Rfl:    icosapent Ethyl (VASCEPA) 1 g capsule, TAKE 2 CAPSULES BY MOUTH  2 TIMES DAILY, Disp: 120 capsule, Rfl: 3   levothyroxine (SYNTHROID) 75 MCG tablet, TAKE 1 TABLET BY MOUTH DAILY BEFORE BREAKFAST, Disp: 90 tablet, Rfl: 0   lidocaine-prilocaine (EMLA) cream, Apply to affected area once, Disp: 30 g, Rfl: 3   loratadine (CLARITIN) 10 MG tablet, Take 10 mg by mouth every morning., Disp: , Rfl:    losartan (COZAAR) 25 MG tablet, TAKE 1 TABLET BY MOUTH DAILY, Disp: 30 tablet, Rfl: 3   nicotine (NICODERM CQ - DOSED IN MG/24 HR) 7 mg/24hr patch, Place 1 patch (7 mg total) onto the skin daily., Disp: 28 patch, Rfl: 1   NON FORMULARY, Pt uses a cpap nightly, Disp: , Rfl:    nystatin (MYCOSTATIN) 100000 UNIT/ML suspension, Take 5 mLs (500,000 Units total) by mouth 4 (four) times daily. Swish and spit, Disp: 60 mL, Rfl: 0   ondansetron (ZOFRAN) 8 MG tablet, Take 1 tablet (8 mg total) by mouth 2 (two) times daily as needed for refractory nausea / vomiting. Start on day 3 after chemotherapy., Disp: 30 tablet, Rfl: 1   paliperidone (INVEGA SUSTENNA) 234 MG/1.5ML SUSY injection, Inject 234 mg into the muscle every 30 (thirty) days. On or about the 14th of each month, Disp: , Rfl:    pantoprazole (PROTONIX) 40 MG tablet, TAKE 1 TABLET BY MOUTH DAILY, Disp: 90 tablet, Rfl: 0   Pediatric Multivit-Minerals-C (CHEWABLES MULTIVITAMIN PO), Take 1 tablet by mouth daily., Disp: , Rfl:    potassium chloride (KLOR-CON M) 10 MEQ tablet, Take 1 tablet (10 mEq total) by mouth daily., Disp: 14 tablet, Rfl: 0   prochlorperazine (COMPAZINE) 10 MG tablet, Take 1 tablet (10 mg total) by mouth every 6 (six) hours as needed for nausea or   vomiting., Disp: 60 tablet, Rfl: 3   rosuvastatin (CRESTOR) 40 MG tablet, Take 1 tablet (40 mg total) by mouth daily. (Patient taking differently: Take 40 mg by mouth every morning.), Disp: 30 tablet, Rfl: 3   sertraline (ZOLOFT) 100 MG tablet, Take 200 mg by mouth every morning., Disp: , Rfl:    Spacer/Aero-Holding Chambers (AEROCHAMBER MV) inhaler, Use as  instructed, Disp: 1 each, Rfl: 0 No current facility-administered medications for this visit.  Facility-Administered Medications Ordered in Other Visits:    fluorouracil (ADRUCIL) 3,850 mg in sodium chloride 0.9 % 73 mL chemo infusion, 2,400 mg/m2 (Order-Specific), Intravenous, 1 day or 1 dose, [name], MD   leucovorin 650 mg in dextrose 5 % 250 mL infusion, 650 mg, Intravenous, Once, [name], MD   oxaliplatin (ELOXATIN) 140 mg in dextrose 5 % 500 mL chemo infusion, 140 mg, Intravenous, Once, [name], MD  Physical exam:  Vitals:   07/07/22 0842  BP: 135/79  Pulse: 88  Resp: 16  Temp: (!) 96.9 F (36.1 C)  SpO2: 96%  Weight: 134 lb 6.4 oz (61 kg)   Physical Exam Constitutional:      General: She is not in acute distress. Cardiovascular:     Rate and Rhythm: Normal rate and regular rhythm.     Heart sounds: Normal heart sounds.  Pulmonary:     Effort: Pulmonary effort is normal.  Skin:    General: Skin is warm and dry.  Neurological:     Mental Status: She is alert and oriented to person, place, and time.         Latest Ref Rng & Units 07/07/2022    8:21 AM  CMP  Glucose 70 - 99 mg/dL 196   BUN 6 - 20 mg/dL 15   Creatinine 0.44 - 1.00 mg/dL 1.08   Sodium 135 - 145 mmol/L 139   Potassium 3.5 - 5.1 mmol/L 4.0   Chloride 98 - 111 mmol/L 102   CO2 22 - 32 mmol/L 27   Calcium 8.9 - 10.3 mg/dL 9.0   Total Protein 6.5 - 8.1 g/dL 6.7   Total Bilirubin 0.3 - 1.2 mg/dL 0.6   Alkaline Phos 38 - 126 U/L 89   AST 15 - 41 U/L 18   ALT 0 - 44 U/L 14       Latest Ref Rng & Units 07/07/2022    8:21 AM  CBC  WBC 4.0 - 10.5 K/uL 6.2   Hemoglobin 12.0 - 15.0 g/dL 14.6   Hematocrit 36.0 - 46.0 % 42.8   Platelets 150 - 400 K/uL 87     No images are attached to the encounter.  No results found.   Assessment and plan- Patient is a 51 y.o. female with history of stage III rectal cancer [id number] s/p APR here for on treatment assessment prior to cycle4 of FOLFOX  chemotherapy  Counts okay to proceed with cycle 4 of adjuvant FOLFOX chemotherapy today.  She does have thrombocytopenia with a platelet count of 87 similar to what it was about 2 weeks ago likely secondary to oxaliplatin.  Since platelet count is more than 75 she will proceed with cycle 4 of FOLFOX chemotherapy today.  As per my conversation with Dr. Collie Siad team plan is to proceed with concurrent chemoradiation at this time.  She will be getting Xeloda day 25 mg per metered square Monday to Friday when she starts radiation and these appointments will be coordinated by Dr. Collie Siad team and follow-up with Dr.  Yu will be based on when she starts radiation  Peripheral neuropathy: Has been there even prior to chemotherapy and is overall stable.  She will receive full dose of oxaliplatin 85 mg per metered squared today  Chemo-induced thrombocytopenia: Will likely recover after oxaliplatin is stopped.  Continue to monitor   Visit Diagnosis 1. Encounter for antineoplastic chemotherapy   2. Rectal cancer (HCC)   3. Chemotherapy-induced thrombocytopenia      Dr.  , MD, MPH [facility] [phone] 07/07/2022 10:27 AM                

## 2022-07-07 NOTE — Patient Instructions (Signed)
[facility]  Discharge Instructions: Thank you for choosing [facility] to provide your oncology and hematology care.  If you have a lab appointment with the Cancer Center, please go directly to the Cancer Center and check in at the registration area.  Wear comfortable clothing and clothing appropriate for easy access to any Portacath or PICC line.   We strive to give you quality time with your provider. You may need to reschedule your appointment if you arrive late (15 or more minutes).  Arriving late affects you and other patients whose appointments are after yours.  Also, if you miss three or more appointments without notifying the office, you may be dismissed from the clinic at the provider's discretion.      For prescription refill requests, have your pharmacy contact our office and allow 72 hours for refills to be completed.    Today you received the following chemotherapy and/or immunotherapy agents OXALIPLATIN, LEUCOVORIN, 5FU      To help prevent nausea and vomiting after your treatment, we encourage you to take your nausea medication as directed.  BELOW ARE SYMPTOMS THAT SHOULD BE REPORTED IMMEDIATELY: *FEVER GREATER THAN 100.4 F (38 C) OR HIGHER *CHILLS OR SWEATING *NAUSEA AND VOMITING THAT IS NOT CONTROLLED WITH YOUR NAUSEA MEDICATION *UNUSUAL SHORTNESS OF BREATH *UNUSUAL BRUISING OR BLEEDING *URINARY PROBLEMS (pain or burning when urinating, or frequent urination) *BOWEL PROBLEMS (unusual diarrhea, constipation, pain near the anus) TENDERNESS IN MOUTH AND THROAT WITH OR WITHOUT PRESENCE OF ULCERS (sore throat, sores in mouth, or a toothache) UNUSUAL RASH, SWELLING OR PAIN  UNUSUAL VAGINAL DISCHARGE OR ITCHING   Items with * indicate a potential emergency and should be followed up as soon as possible or go to the Emergency Department if any problems should occur.  Please show the CHEMOTHERAPY ALERT CARD or IMMUNOTHERAPY ALERT  CARD at check-in to the Emergency Department and triage nurse.  Should you have questions after your visit or need to cancel or reschedule your appointment, please contact [facility]  [phone] and follow the prompts.  Office hours are 8:00 a.m. to 4:30 p.m. Monday - Friday. Please note that voicemails left after 4:00 p.m. may not be returned until the following business day.  We are closed weekends and major holidays. You have access to a nurse at all times for urgent questions. Please call the main number to the clinic [phone] and follow the prompts.  For any non-urgent questions, you may also contact your provider using MyChart. We now offer e-Visits for anyone 18 and older to request care online for non-urgent symptoms. For details visit [email or web address].   Also download the MyChart app! Go to the app store, search "MyChart", open the app, select [facility], and log in with your MyChart username and password.  Masks are optional in the cancer centers. If you would like for your care team to wear a mask while they are taking care of you, please let them know. For doctor visits, patients may have with them one support person who is at least 50 years old. At this time, visitors are not allowed in the infusion area.  Oxaliplatin Injection What is this medication? OXALIPLATIN (ox AL i PLA tin) is a chemotherapy drug. It targets fast dividing cells, like cancer cells, and causes these cells to die. This medicine is used to treat cancers of the colon and rectum, and many other cancers. This medicine may be used for other purposes;   ask your health care provider or pharmacist if you have questions. COMMON BRAND NAME(S): Eloxatin What should I tell my care team before I take this medication? They need to know if you have any of these conditions: heart disease history of irregular heartbeat liver disease low blood counts, like white cells, platelets, or  red blood cells lung or breathing disease, like asthma take medicines that treat or prevent blood clots tingling of the fingers or toes, or other nerve disorder an unusual or allergic reaction to oxaliplatin, other chemotherapy, other medicines, foods, dyes, or preservatives pregnant or trying to get pregnant breast-feeding How should I use this medication? This drug is given as an infusion into a vein. It is administered in a hospital or clinic by a specially trained health care professional. Talk to your pediatrician regarding the use of this medicine in children. Special care may be needed. Overdosage: If you think you have taken too much of this medicine contact a poison control center or emergency room at once. NOTE: This medicine is only for you. Do not share this medicine with others. What if I miss a dose? It is important not to miss a dose. Call your doctor or health care professional if you are unable to keep an appointment. What may interact with this medication? Do not take this medicine with any of the following medications: cisapride dronedarone pimozide thioridazine This medicine may also interact with the following medications: aspirin and aspirin-like medicines certain medicines that treat or prevent blood clots like warfarin, apixaban, dabigatran, and rivaroxaban cisplatin cyclosporine diuretics medicines for infection like acyclovir, adefovir, amphotericin B, bacitracin, cidofovir, foscarnet, ganciclovir, gentamicin, pentamidine, vancomycin NSAIDs, medicines for pain and inflammation, like ibuprofen or naproxen other medicines that prolong the QT interval (an abnormal heart rhythm) pamidronate zoledronic acid This list may not describe all possible interactions. Give your health care provider a list of all the medicines, herbs, non-prescription drugs, or dietary supplements you use. Also tell them if you smoke, drink alcohol, or use illegal drugs. Some items may  interact with your medicine. What should I watch for while using this medication? Your condition will be monitored carefully while you are receiving this medicine. You may need blood work done while you are taking this medicine. This medicine may make you feel generally unwell. This is not uncommon as chemotherapy can affect healthy cells as well as cancer cells. Report any side effects. Continue your course of treatment even though you feel ill unless your healthcare professional tells you to stop. This medicine can make you more sensitive to cold. Do not drink cold drinks or use ice. Cover exposed skin before coming in contact with cold temperatures or cold objects. When out in cold weather wear warm clothing and cover your mouth and nose to warm the air that goes into your lungs. Tell your doctor if you get sensitive to the cold. Do not become pregnant while taking this medicine or for 9 months after stopping it. Women should inform their health care professional if they wish to become pregnant or think they might be pregnant. Men should not father a child while taking this medicine and for 6 months after stopping it. There is potential for serious side effects to an unborn child. Talk to your health care professional for more information. Do not breast-feed a child while taking this medicine or for 3 months after stopping it. This medicine has caused ovarian failure in some women. This medicine may make it more difficult to get   pregnant. Talk to your health care professional if you are concerned about your fertility. This medicine has caused decreased sperm counts in some men. This may make it more difficult to father a child. Talk to your health care professional if you are concerned about your fertility. This medicine may increase your risk of getting an infection. Call your health care professional for advice if you get a fever, chills, or sore throat, or other symptoms of a cold or flu. Do not  treat yourself. Try to avoid being around people who are sick. Avoid taking medicines that contain aspirin, acetaminophen, ibuprofen, naproxen, or ketoprofen unless instructed by your health care professional. These medicines may hide a fever. Be careful brushing or flossing your teeth or using a toothpick because you may get an infection or bleed more easily. If you have any dental work done, tell your dentist you are receiving this medicine. What side effects may I notice from receiving this medication? Side effects that you should report to your doctor or health care professional as soon as possible: allergic reactions like skin rash, itching or hives, swelling of the face, lips, or tongue breathing problems cough low blood counts - this medicine may decrease the number of white blood cells, red blood cells, and platelets. You may be at increased risk for infections and bleeding nausea, vomiting pain, redness, or irritation at site where injected pain, tingling, numbness in the hands or feet signs and symptoms of bleeding such as bloody or black, tarry stools; red or dark brown urine; spitting up blood or brown material that looks like coffee grounds; red spots on the skin; unusual bruising or bleeding from the eyes, gums, or nose signs and symptoms of a dangerous change in heartbeat or heart rhythm like chest pain; dizziness; fast, irregular heartbeat; palpitations; feeling faint or lightheaded; falls signs and symptoms of infection like fever; chills; cough; sore throat; pain or trouble passing urine signs and symptoms of liver injury like dark yellow or brown urine; general ill feeling or flu-like symptoms; light-colored stools; loss of appetite; nausea; right upper belly pain; unusually weak or tired; yellowing of the eyes or skin signs and symptoms of low red blood cells or anemia such as unusually weak or tired; feeling faint or lightheaded; falls signs and symptoms of muscle injury like  dark urine; trouble passing urine or change in the amount of urine; unusually weak or tired; muscle pain; back pain Side effects that usually do not require medical attention (report to your doctor or health care professional if they continue or are bothersome): changes in taste diarrhea gas hair loss loss of appetite mouth sores This list may not describe all possible side effects. Call your doctor for medical advice about side effects. You may report side effects to FDA at 1-800-FDA-1088. Where should I keep my medication? This drug is given in a hospital or clinic and will not be stored at home. NOTE: This sheet is a summary. It may not cover all possible information. If you have questions about this medicine, talk to your doctor, pharmacist, or health care provider.  2023 Elsevier/Gold Standard (2021-11-11 00:00:00)  Leucovorin injection What is this medication? LEUCOVORIN (loo koe VOR in) is used to prevent or treat the harmful effects of some medicines. This medicine is used to treat anemia caused by a low amount of folic acid in the body. It is also used with 5-fluorouracil (5-FU) to treat colon cancer. This medicine may be used for other purposes; ask your   health care provider or pharmacist if you have questions. What should I tell my care team before I take this medication? They need to know if you have any of these conditions: anemia from low levels of vitamin B-12 in the blood an unusual or allergic reaction to leucovorin, folic acid, other medicines, foods, dyes, or preservatives pregnant or trying to get pregnant breast-feeding How should I use this medication? This medicine is for injection into a muscle or into a vein. It is given by a health care professional in a hospital or clinic setting. Talk to your pediatrician regarding the use of this medicine in children. Special care may be needed. Overdosage: If you think you have taken too much of this medicine contact a poison  control center or emergency room at once. NOTE: This medicine is only for you. Do not share this medicine with others. What if I miss a dose? This does not apply. What may interact with this medication? capecitabine fluorouracil phenobarbital phenytoin primidone trimethoprim-sulfamethoxazole This list may not describe all possible interactions. Give your health care provider a list of all the medicines, herbs, non-prescription drugs, or dietary supplements you use. Also tell them if you smoke, drink alcohol, or use illegal drugs. Some items may interact with your medicine. What should I watch for while using this medication? Your condition will be monitored carefully while you are receiving this medicine. This medicine may increase the side effects of 5-fluorouracil, 5-FU. Tell your doctor or health care professional if you have diarrhea or mouth sores that do not get better or that get worse. What side effects may I notice from receiving this medication? Side effects that you should report to your doctor or health care professional as soon as possible: allergic reactions like skin rash, itching or hives, swelling of the face, lips, or tongue breathing problems fever, infection mouth sores unusual bleeding or bruising unusually weak or tired Side effects that usually do not require medical attention (report to your doctor or health care professional if they continue or are bothersome): constipation or diarrhea loss of appetite nausea, vomiting This list may not describe all possible side effects. Call your doctor for medical advice about side effects. You may report side effects to FDA at 1-800-FDA-1088. Where should I keep my medication? This drug is given in a hospital or clinic and will not be stored at home. NOTE: This sheet is a summary. It may not cover all possible information. If you have questions about this medicine, talk to your doctor, pharmacist, or health care provider.   2023 Elsevier/Gold Standard (2008-06-18 00:00:00)  Fluorouracil, 5-FU injection What is this medication? FLUOROURACIL, 5-FU (flure oh YOOR a sil) is a chemotherapy drug. It slows the growth of cancer cells. This medicine is used to treat many types of cancer like breast cancer, colon or rectal cancer, pancreatic cancer, and stomach cancer. This medicine may be used for other purposes; ask your health care provider or pharmacist if you have questions. COMMON BRAND NAME(S): Adrucil What should I tell my care team before I take this medication? They need to know if you have any of these conditions: blood disorders dihydropyrimidine dehydrogenase (DPD) deficiency infection (especially a virus infection such as chickenpox, cold sores, or herpes) kidney disease liver disease malnourished, poor nutrition recent or ongoing radiation therapy an unusual or allergic reaction to fluorouracil, other chemotherapy, other medicines, foods, dyes, or preservatives pregnant or trying to get pregnant breast-feeding How should I use this medication? This drug is given   as an infusion or injection into a vein. It is administered in a hospital or clinic by a specially trained health care professional. Talk to your pediatrician regarding the use of this medicine in children. Special care may be needed. Overdosage: If you think you have taken too much of this medicine contact a poison control center or emergency room at once. NOTE: This medicine is only for you. Do not share this medicine with others. What if I miss a dose? It is important not to miss your dose. Call your doctor or health care professional if you are unable to keep an appointment. What may interact with this medication? Do not take this medicine with any of the following medications: live virus vaccines This medicine may also interact with the following medications: medicines that treat or prevent blood clots like warfarin, enoxaparin, and  dalteparin This list may not describe all possible interactions. Give your health care provider a list of all the medicines, herbs, non-prescription drugs, or dietary supplements you use. Also tell them if you smoke, drink alcohol, or use illegal drugs. Some items may interact with your medicine. What should I watch for while using this medication? Visit your doctor for checks on your progress. This drug may make you feel generally unwell. This is not uncommon, as chemotherapy can affect healthy cells as well as cancer cells. Report any side effects. Continue your course of treatment even though you feel ill unless your doctor tells you to stop. In some cases, you may be given additional medicines to help with side effects. Follow all directions for their use. Call your doctor or health care professional for advice if you get a fever, chills or sore throat, or other symptoms of a cold or flu. Do not treat yourself. This drug decreases your body's ability to fight infections. Try to avoid being around people who are sick. This medicine may increase your risk to bruise or bleed. Call your doctor or health care professional if you notice any unusual bleeding. Be careful brushing and flossing your teeth or using a toothpick because you may get an infection or bleed more easily. If you have any dental work done, tell your dentist you are receiving this medicine. Avoid taking products that contain aspirin, acetaminophen, ibuprofen, naproxen, or ketoprofen unless instructed by your doctor. These medicines may hide a fever. Do not become pregnant while taking this medicine. Women should inform their doctor if they wish to become pregnant or think they might be pregnant. There is a potential for serious side effects to an unborn child. Talk to your health care professional or pharmacist for more information. Do not breast-feed an infant while taking this medicine. Men should inform their doctor if they wish to  father a child. This medicine may lower sperm counts. Do not treat diarrhea with over the counter products. Contact your doctor if you have diarrhea that lasts more than 2 days or if it is severe and watery. This medicine can make you more sensitive to the sun. Keep out of the sun. If you cannot avoid being in the sun, wear protective clothing and use sunscreen. Do not use sun lamps or tanning beds/booths. What side effects may I notice from receiving this medication? Side effects that you should report to your doctor or health care professional as soon as possible: allergic reactions like skin rash, itching or hives, swelling of the face, lips, or tongue low blood counts - this medicine may decrease the number of white blood   cells, red blood cells and platelets. You may be at increased risk for infections and bleeding. signs of infection - fever or chills, cough, sore throat, pain or difficulty passing urine signs of decreased platelets or bleeding - bruising, pinpoint red spots on the skin, black, tarry stools, blood in the urine signs of decreased red blood cells - unusually weak or tired, fainting spells, lightheadedness breathing problems changes in vision chest pain mouth sores nausea and vomiting pain, swelling, redness at site where injected pain, tingling, numbness in the hands or feet redness, swelling, or sores on hands or feet stomach pain unusual bleeding Side effects that usually do not require medical attention (report to your doctor or health care professional if they continue or are bothersome): changes in finger or toe nails diarrhea dry or itchy skin hair loss headache loss of appetite sensitivity of eyes to the light stomach upset unusually teary eyes This list may not describe all possible side effects. Call your doctor for medical advice about side effects. You may report side effects to FDA at 1-800-FDA-1088. Where should I keep my medication? This drug is given  in a hospital or clinic and will not be stored at home. NOTE: This sheet is a summary. It may not cover all possible information. If you have questions about this medicine, talk to your doctor, pharmacist, or health care provider.  2023 Elsevier/Gold Standard (2021-11-11 00:00:00)    

## 2022-07-10 ENCOUNTER — Ambulatory Visit
Admission: RE | Admit: 2022-07-10 | Discharge: 2022-07-10 | Disposition: A | Discharge status: Home / Self Care | Payer: Medicare Other | Source: Ambulatory Visit | Attending: Radiation Oncology | Admitting: Radiation Oncology

## 2022-07-10 ENCOUNTER — Inpatient Hospital Stay: Payer: Medicare Other

## 2022-07-10 ENCOUNTER — Inpatient Hospital Stay (HOSPITAL_BASED_OUTPATIENT_CLINIC_OR_DEPARTMENT_OTHER): Payer: Medicare Other | Admitting: Medical Oncology

## 2022-07-10 VITALS — BP 120/92 | HR 79 | Temp 97.0°F | Resp 20 | Wt 132.4 lb

## 2022-07-10 VITALS — BP 119/84 | HR 110 | Temp 96.7°F | Resp 18

## 2022-07-10 DIAGNOSIS — R809 Proteinuria, unspecified: Secondary | ICD-10-CM

## 2022-07-10 DIAGNOSIS — Z9221 Personal history of antineoplastic chemotherapy: Secondary | ICD-10-CM | POA: Diagnosis not present

## 2022-07-10 DIAGNOSIS — R350 Frequency of micturition: Secondary | ICD-10-CM | POA: Diagnosis not present

## 2022-07-10 DIAGNOSIS — D696 Thrombocytopenia, unspecified: Secondary | ICD-10-CM | POA: Insufficient documentation

## 2022-07-10 DIAGNOSIS — E860 Dehydration: Secondary | ICD-10-CM

## 2022-07-10 DIAGNOSIS — C20 Malignant neoplasm of rectum: Secondary | ICD-10-CM | POA: Insufficient documentation

## 2022-07-10 DIAGNOSIS — E1129 Type 2 diabetes mellitus with other diabetic kidney complication: Secondary | ICD-10-CM

## 2022-07-10 DIAGNOSIS — Z5111 Encounter for antineoplastic chemotherapy: Secondary | ICD-10-CM | POA: Diagnosis not present

## 2022-07-10 DIAGNOSIS — G629 Polyneuropathy, unspecified: Secondary | ICD-10-CM | POA: Insufficient documentation

## 2022-07-10 LAB — CBC WITH DIFFERENTIAL/PLATELET
Abs Immature Granulocytes: 0.02 10*3/uL (ref 0.00–0.07)
Basophils Absolute: 0 10*3/uL (ref 0.0–0.1)
Basophils Relative: 1 %
Eosinophils Absolute: 0.1 10*3/uL (ref 0.0–0.5)
Eosinophils Relative: 2 %
HCT: 46.3 % — ABNORMAL HIGH (ref 36.0–46.0)
Hemoglobin: 15.5 g/dL — ABNORMAL HIGH (ref 12.0–15.0)
Immature Granulocytes: 0 %
Lymphocytes Relative: 28 %
Lymphs Abs: 1.5 10*3/uL (ref 0.7–4.0)
MCH: 31.3 pg (ref 26.0–34.0)
MCHC: 33.5 g/dL (ref 30.0–36.0)
MCV: 93.3 fL (ref 80.0–100.0)
Monocytes Absolute: 0.2 10*3/uL (ref 0.1–1.0)
Monocytes Relative: 5 %
Neutro Abs: 3.3 10*3/uL (ref 1.7–7.7)
Neutrophils Relative %: 64 %
Platelets: 115 10*3/uL — ABNORMAL LOW (ref 150–400)
RBC: 4.96 MIL/uL (ref 3.87–5.11)
RDW: 15.6 % — ABNORMAL HIGH (ref 11.5–15.5)
WBC: 5.1 10*3/uL (ref 4.0–10.5)
nRBC: 0 % (ref 0.0–0.2)

## 2022-07-10 LAB — URINALYSIS, COMPLETE (UACMP) WITH MICROSCOPIC
Bilirubin Urine: NEGATIVE
Glucose, UA: >=500 mg/dL — AB
Hgb urine dipstick: NEGATIVE
Ketones, ur: 5 mg/dL — AB
Nitrite: NEGATIVE
Protein, ur: 30 mg/dL — AB
Specific Gravity, Urine: 1.025 (ref 1.005–1.030)
pH: 6 (ref 5.0–8.0)

## 2022-07-10 MED ORDER — SODIUM CHLORIDE 0.9% FLUSH
10.0000 mL | INTRAVENOUS | Status: DC | PRN
  Administered 2022-07-10: 10 mL
  Filled 2022-07-10: qty 10

## 2022-07-10 MED ORDER — NITROFURANTOIN MONOHYD MACRO 100 MG PO CAPS: 100.0000 mg | ORAL_CAPSULE | Freq: Two times a day (BID) | ORAL | 0 refills | Status: DC

## 2022-07-10 MED ORDER — HEPARIN SOD (PORK) LOCK FLUSH 100 UNIT/ML IV SOLN
500.0000 [IU] | Freq: Once | INTRAVENOUS | Status: AC | PRN
  Administered 2022-07-10: 500 [IU]
  Filled 2022-07-10: qty 5

## 2022-07-10 NOTE — Progress Notes (Signed)
Radiation Oncology Follow up Note  Name: April Luna   Date:   07/10/2022 MRN:  [id number] DOB: 12/06/1971    This 51 y.o. female presents to the clinic today for evaluation of adjuvant radiation therapy with oral Xeloda and patient was stage III (T3N1BM0) pathologic staging adenocarcinoma the rectum status post APR currently completing FOLFOX chemotherapy.  REFERRING PROVIDER: Steele Sizer, MD  HPI: Patient is a 51 year old female originally consulted back in June 2023.  Her MRI scan.  After diagnosis of rectal adenocarcinoma stage II is a T1/T2 N0.  At the time of surgery she had a 4.5 cm tumor histologic grade 2 with invasion through the muscularis propria in the perirectal tissue.  There is also 3 of 15 lymph nodes positive for metastatic disease.  She is completed FOLFOX chemotherapy which she is tolerated well.  Her colostomy is functioning well.  She does have what she describes as a UTI is being worked up for possible antibiotic therapy.  She is now referred to radiation collagen for consideration of adjuvant radiation.  She completed her FOLFOX with mild thrombocytopenia.  And has some slight peripheral neuropathy.  COMPLICATIONS OF TREATMENT: none  FOLLOW UP COMPLIANCE: keeps appointments   PHYSICAL EXAM:  BP (!) 120/92   Pulse 79   Temp (!) 97 F (36.1 C)   Resp 20   Wt 132 lb 6.4 oz (60.1 kg)   BMI 26.74 kg/m  Patient has a functioning colostomy.  Well-developed well-nourished patient in NAD. HEENT reveals PERLA, EOMI, discs not visualized.  Oral cavity is clear. No oral mucosal lesions are identified. Neck is clear without evidence of cervical or supraclavicular adenopathy. Lungs are clear to A&P. Cardiac examination is essentially unremarkable with regular rate and rhythm without murmur rub or thrill. Abdomen is benign with no organomegaly or masses noted. Motor sensory and DTR levels are equal and symmetric in the upper and lower extremities. Cranial nerves II  through XII are grossly intact. Proprioception is intact. No peripheral adenopathy or edema is identified. No motor or sensory levels are noted. Crude visual fields are within normal range.  RADIOLOGY RESULTS: MRI of her pelvis reviewed compatible with above-stated findings  PLAN: At this time like to go ahead with whole pelvic radiation.  Plan of delivering Sp[place]ding over 5 weeks and boosting the area of original tumor involvement another 540 cGy.  Risks and benefits of treatment occluding skin reaction fatigue alteration blood counts possible increased lower urinary tract symptoms and diarrhea all were discussed in detail with the patient.  I have personally set up and ordered CT simulation for next week.  Once we start treatment she will start her oral Xeloda.  That is already been ordered for her.  Patient comprehends my recommendations well.  I would like to take this opportunity to thank you for allowing me to participate in the care of your patient.Noreene Filbert, MD

## 2022-07-10 NOTE — Progress Notes (Signed)
Symptom Management [place] at [facility] [phone] [phone]  Patient Care Team: Steele Sizer, MD as PCP - General (Family Medicine) Sharmaine Base, MD as Referring Physician (Psychiatry) Bjorn Loser, MD as Consulting Physician (Urology) Germaine Pomfret, [facility] (Pharmacist) Clent Jacks, RN as Oncology Nurse Navigator   Name of the patient: April Luna  [id number]  [birth year]   Date of visit: 07/10/22  Reason for Consult: April Luna is a 51 y.o. female who presents today for:   Nausea: Patient presented to our clinic for disconnect of her chemotherapy pump. She described feeling nauseated this morning and we noted that her heart rate was a bit elevated. She then was evaluated by me in [facility]. She reports that additionally she has had some urinary frequency and urgency. No hematuria, fever, abdominal pain, diarrhea, vomiting, chest pain, new sob. She has not been drinking much water due to the nausea.   PAST MEDICAL HISTORY: Past Medical History:  Diagnosis Date   Allergy    pollen   Anxiety    Arthritis    right hip   Bipolar 1 disorder ([place])    Cancer (HCC)    rectal   Chronic kidney disease    COPD (chronic obstructive pulmonary disease) (HCC)    Depression    Family history of adverse reaction to anesthesia    grand father had a stroke during anesthesia   Family history of breast cancer    Family history of colon cancer    Family history of uterine cancer    GERD (gastroesophageal reflux disease)    History of kidney stones    Hyperlipidemia    Hypertension    Hypothyroidism    Panic attack    Pneumonia    Psoriasis    Sleep apnea 08/11/2021   No CPAP   Type 2 diabetes mellitus with microalbuminuria, without long-term current use of insulin ([place]) 06/24/2019    PAST SURGICAL HISTORY:  Past Surgical History:  Procedure Laterality Date   BREAST BIOPSY Left 12/14/2021   [place]  bx, venus marker, path pending   CESAREAN SECTION     COLONOSCOPY WITH PROPOFOL N/A 02/09/2022   Procedure: COLONOSCOPY WITH PROPOFOL;  Surgeon: Lin Landsman, MD;  Location: [place];  Service: Endoscopy;  Laterality: N/A;  sleep apnea   CYSTOSCOPY W/ RETROGRADES Left 11/08/2018   Procedure: CYSTOSCOPY WITH RETROGRADE PYELOGRAM;  Surgeon: Billey Co, MD;  Location: [facility];  Service: Urology;  Laterality: Left;   CYSTOSCOPY/URETEROSCOPY/HOLMIUM LASER/STENT PLACEMENT Left 11/08/2018   Procedure: CYSTOSCOPY/URETEROSCOPY/HOLMIUM LASER/STENT PLACEMENT;  Surgeon: Billey Co, MD;  Location: [facility];  Service: Urology;  Laterality: Left;   IR IMAGING GUIDED PORT INSERTION  05/11/2022   MOUTH SURGERY     wisdom teeth extraction   MOUTH SURGERY     teeth removal   POLYPECTOMY N/A 02/09/2022   Procedure: POLYPECTOMY;  Surgeon: Lin Landsman, MD;  Location: [facility];  Service: Endoscopy;  Laterality: N/A;   XI ROBOTIC ASSISTED LOWER ANTERIOR RESECTION N/A 04/21/2022   Procedure: XI ROBOTIC ASSISTED LOWER ANTERIOR RESECTION WITH COLOSTOMY, BILATERAL TAP BLOCK, ASSESSMENT OF TISSUE PERFUSSION WITH FIREFLY INJECTION;  Surgeon: Ileana Roup, MD;  Location: [facility];  Service: General;  Laterality: N/A;    HEMATOLOGY/ONCOLOGY HISTORY:  Oncology History  Rectal cancer ([place])  02/26/2022 Imaging   MRI PELVIS WITHOUT CONTRAST- By imaging, rectal cancer stage:  T1/T2, N0, Mx    03/02/2022 Imaging  CT CHEST AND ABDOMEN WITH CONTRAST 1. No convincing evidence of metastatic disease within the chest or abdomen. 2. Atrophic left kidney with multifocal renal scarring and cortical calcifications as well as nonobstructive renal stones measuring up to 5 mm. 3. Prominent left-sided predominant retroperitoneal lymph nodes measuring up to 8 mm near the level of the renal hilum, overall decreased in size dating back to CT September 19, 2018 and favored reactive related  to left renal inflammation. 4.  Aortic Atherosclerosis (ICD10-I70.0).   03/27/2022 Genetic Testing    [facility] CustomNext+RNA cancer panel found no pathogenic mutations.    04/21/2022 Initial Diagnosis   Rectal cancer - baseline CEA 3.6 -02/09/2022, patient had colonoscopy which showed renal mass 10 cm from anal verge.  5 mm polyp in ascending colon.  Removed and retrieved. Pathology showed rectal adenocarcinoma.  The polyp in the ascending colon is a tubular adenoma. -04/21/2022, patient underwent robotic assisted ultralow anterior resection. Pathology showed moderately differentiated adenocarcinoma, 4.5 cm in maximal extent, with focal extension through muscularis propria into perirectal soft tissue.  3 lymph nodes positive for metastatic carcinoma.  Negative margin.  pT3 pN1b, MSI stable.   05/03/2022 Cancer Staging   Staging form: Colon and Rectum, AJCC 8th Edition - Pathologic stage from 05/03/2022: Stage IIIB (pT3, pN1b, cM0) - Signed by [name], MD on 05/03/2022 Stage prefix: Initial diagnosis   05/11/2022 Miscellaneous   Medi port placed by Dr.White   05/26/2022 -  Chemotherapy   FOLFOX every 2 weeks.      ALLERGIES:  is allergic to metformin and related, nsaids, perphenazine, sulfa antibiotics, abilify [aripiprazole], and penicillins.  MEDICATIONS:  Current Outpatient Medications  Medication Sig Dispense Refill   nitrofurantoin, macrocrystal-monohydrate, (MACROBID) 100 MG capsule Take 1 capsule (100 mg total) by mouth 2 (two) times daily for 7 days. 14 capsule 0   acetaminophen (TYLENOL) 325 MG tablet Take 650 mg by mouth every 6 (six) hours as needed for headache (pain).     albuterol (VENTOLIN HFA) 108 (90 Base) MCG/ACT inhaler Inhale 2 puffs into the lungs every 6 (six) hours as needed for wheezing or shortness of breath. 1 each 5   amantadine (SYMMETREL) 100 MG capsule Take 100 mg by mouth 2 (two) times daily.     blood glucose meter kit and supplies Dispense based on patient and  insurance preference. Use up to four times daily as directed. (FOR ICD-10 E10.9, E11.9). 1 each 0   Budeson-Glycopyrrol-Formoterol (BREZTRI AEROSPHERE) 160-9-4.8 MCG/ACT AERO Inhale 2 puffs into the lungs in the morning and at bedtime. 5.9 g 11   buPROPion (WELLBUTRIN XL) 150 MG 24 hr tablet Take 150 mg by mouth every morning.     capecitabine (XELODA) 150 MG tablet Take 2 tablets (300 mg total) by mouth 2 (two) times daily after a meal. Take along with 533m tablets. Take Monday- Friday. Take only on days of radiation. 112 tablet 0   capecitabine (XELODA) 500 MG tablet Take 2 tablets (1,000 mg total) by mouth 2 (two) times daily after a meal. Take along with 1561mtablets. Take Monday- Friday. Take only on days of radiation. 112 tablet 0   Cholecalciferol (VITAMIN D-3) 125 MCG (5000 UT) TABS Take 5,000 Units by mouth daily. 30 tablet 1   FARXIGA 10 MG TABS tablet TAKE 1 TABLET BY MOUTH DAILY BEFORE BREAKFAST. 30 tablet 2   gabapentin (NEURONTIN) 300 MG capsule Take 300 mg by mouth 2 (two) times daily.     guaiFENesin (MUCINEX) 600 MG 12  hr tablet Take 2 tablets (1,200 mg total) by mouth 2 (two) times daily as needed for to loosen phlegm or cough.     hydrocortisone cream 0.5 % Apply 1 Application topically 2 (two) times daily as needed for itching. 30 g 1   Hydrocortisone, Perianal, 1 % CREA Apply topically.     hydrOXYzine (VISTARIL) 50 MG capsule Take 50 mg by mouth 3 (three) times daily as needed for anxiety.     icosapent Ethyl (VASCEPA) 1 g capsule TAKE 2 CAPSULES BY MOUTH 2 TIMES DAILY 120 capsule 3   levothyroxine (SYNTHROID) 75 MCG tablet TAKE 1 TABLET BY MOUTH DAILY BEFORE BREAKFAST 90 tablet 0   lidocaine-prilocaine (EMLA) cream Apply to affected area once 30 g 3   linaclotide (LINZESS) 145 MCG CAPS capsule TAKE 1 CAPSULE BY MOUTH DAILY BEFORE BREAKFAST 30 capsule 0   loratadine (CLARITIN) 10 MG tablet Take 10 mg by mouth every morning.     losartan (COZAAR) 25 MG tablet TAKE 1 TABLET BY  MOUTH DAILY 30 tablet 3   nicotine (NICODERM CQ - DOSED IN MG/24 HR) 7 mg/24hr patch Place 1 patch (7 mg total) onto the skin daily. 28 patch 1   NON FORMULARY Pt uses a cpap nightly     nystatin (MYCOSTATIN) 100000 UNIT/ML suspension Take 5 mLs (500,000 Units total) by mouth 4 (four) times daily. Swish and spit 60 mL 0   ondansetron (ZOFRAN) 8 MG tablet Take 1 tablet (8 mg total) by mouth 2 (two) times daily as needed for refractory nausea / vomiting. Start on day 3 after chemotherapy. 30 tablet 1   paliperidone (INVEGA SUSTENNA) 234 MG/1.5ML SUSY injection Inject 234 mg into the muscle every 30 (thirty) days. On or about the 14th of each month     pantoprazole (PROTONIX) 40 MG tablet TAKE 1 TABLET BY MOUTH DAILY 90 tablet 0   Pediatric Multivit-Minerals-C (CHEWABLES MULTIVITAMIN PO) Take 1 tablet by mouth daily.     potassium chloride (KLOR-CON M) 10 MEQ tablet Take 1 tablet (10 mEq total) by mouth daily. 14 tablet 0   prochlorperazine (COMPAZINE) 10 MG tablet Take 1 tablet (10 mg total) by mouth every 6 (six) hours as needed for nausea or vomiting. 60 tablet 3   rosuvastatin (CRESTOR) 40 MG tablet Take 1 tablet (40 mg total) by mouth daily. (Patient taking differently: Take 40 mg by mouth every morning.) 30 tablet 3   sertraline (ZOLOFT) 100 MG tablet Take 200 mg by mouth every morning.     Spacer/Aero-Holding Chambers (AEROCHAMBER MV) inhaler Use as instructed 1 each 0   No current facility-administered medications for this visit.    VITAL SIGNS: There were no vitals taken for this visit. There were no vitals filed for this visit.  Estimated body mass index is 26.74 kg/m as calculated from the following:   Height as of 06/19/22: '4\' 11"'  (1.499 m).   Weight as of an earlier encounter on 07/10/22: 132 lb 6.4 oz (60.1 kg).  LABS: CBC:    Component Value Date/Time   WBC 5.1 07/10/2022 1335   HGB 15.5 (H) 07/10/2022 1335   HGB 13.2 04/20/2015 0640   HCT 46.3 (H) 07/10/2022 1335   HCT  39.2 04/20/2015 0640   PLT 115 (L) 07/10/2022 1335   PLT 165 04/20/2015 0640   MCV 93.3 07/10/2022 1335   MCV 96 04/20/2015 0640   NEUTROABS 3.3 07/10/2022 1335   NEUTROABS 4.2 04/20/2015 0640   LYMPHSABS 1.5 07/10/2022 1335  LYMPHSABS 2.4 04/20/2015 0640   MONOABS 0.2 07/10/2022 1335   MONOABS 0.6 04/20/2015 0640   EOSABS 0.1 07/10/2022 1335   EOSABS 0.2 04/20/2015 0640   BASOSABS 0.0 07/10/2022 1335   BASOSABS 0.1 04/20/2015 0640   Comprehensive Metabolic Panel:    Component Value Date/Time   NA 139 07/07/2022 0821   NA 139 04/20/2015 0640   K 4.0 07/07/2022 0821   K 3.9 04/20/2015 0640   CL 102 07/07/2022 0821   CL 105 04/20/2015 0640   CO2 27 07/07/2022 0821   CO2 27 04/20/2015 0640   BUN 15 07/07/2022 0821   BUN 12 04/20/2015 0640   CREATININE 1.08 (H) 07/07/2022 0821   CREATININE 0.94 08/11/2021 1031   GLUCOSE 196 (H) 07/07/2022 0821   GLUCOSE 95 04/20/2015 0640   CALCIUM 9.0 07/07/2022 0821   CALCIUM 9.2 04/20/2015 0640   AST 18 07/07/2022 0821   AST 29 04/20/2015 0640   ALT 14 07/07/2022 0821   ALT 30 04/20/2015 0640   ALKPHOS 89 07/07/2022 0821   ALKPHOS 80 04/20/2015 0640   BILITOT 0.6 07/07/2022 0821   BILITOT 0.2 (L) 04/20/2015 0640   PROT 6.7 07/07/2022 0821   PROT 6.5 04/20/2015 0640   ALBUMIN 3.8 07/07/2022 0821   ALBUMIN 3.7 04/20/2015 0640    RADIOGRAPHIC STUDIES: No results found.  PERFORMANCE STATUS (ECOG) : 1 - Symptomatic but completely ambulatory  Review of Systems Unless otherwise noted, a complete review of systems is negative.  Physical Exam General: NAD Cardiovascular: regular rate and rhythm Pulmonary: clear ant fields Abdomen: soft, nontender, + bowel sounds GU: no suprapubic tenderness Extremities: no edema, no joint deformities Skin: no rashes Neurological: Weakness but otherwise nonfocal  Assessment and Plan- Patient is a 51 y.o. female    Encounter Diagnoses  Name Primary?   Urinary frequency Yes   Dehydration     Type 2 diabetes mellitus with microalbuminuria, without long-term current use of insulin ([place])    Encounter for antineoplastic chemotherapy      New. Urinalysis shows glucosuria, ketones, protein and leukocytes. Likely beginning UTI causing dehydration and nausea along with elevated heart rate due to dehydration. She is on farxiga so the glucosuria can be explained. I called patient and had her check her fasting blood sugar which was 210. She is afebrile without WBC shift. Given her status will start her on prophylactic ABX -macrobid-while we await culture results. She will hydrate with water and keep a close eye on her blood sugar levels. Follow up this week in clinic. Red flags discussed.      Patient expressed understanding and was in agreement with this plan. She also understands that She can call clinic at any time with any questions, concerns, or complaints.   Thank you for allowing me to participate in the care of this very pleasant patient.   Time Total: 25  Visit consisted of counseling and education dealing with the complex and emotionally intense issues of symptom management in the setting of serious illness.Greater than 50%  of this time was spent counseling and coordinating care related to the above assessment and plan.  Signed by: Nelwyn Salisbury, PA-C

## 2022-07-10 NOTE — Progress Notes (Signed)
Per Nelwyn Salisbury, PA encourage patient to push fluids and to take Miralax as directed. Patient verbalizes understanding and denies any further questions or concerns.

## 2022-07-10 NOTE — Progress Notes (Signed)
Patient presented to clinic for pump DC. HR trending 107-110. Patient reports mild nausea. After making Nelwyn Salisbury, PA aware, patient mentioned "Im just not feeling well at all". Nelwyn Salisbury, PA updated. Sarah to see patient at chairside. Per Nelwyn Salisbury, PA, collect UA/Culture and CBC/D. Okay to discharge home.

## 2022-07-11 ENCOUNTER — Other Ambulatory Visit (HOSPITAL_COMMUNITY): Payer: Self-pay

## 2022-07-11 ENCOUNTER — Telehealth: Payer: Self-pay | Admitting: Medical Oncology

## 2022-07-11 LAB — HEMOGLOBIN A1C: Hemoglobin A1C: 7.8

## 2022-07-11 LAB — HM DIABETES FOOT EXAM: HM Diabetic Foot Exam: NORMAL

## 2022-07-11 NOTE — Telephone Encounter (Signed)
Called patient to schedule follow-up labs+ [facility] this week per Nelwyn Salisbury. Patient declined scheduling at this time due to not feeling "up to it" and having several other appointments this week. I let patient know to please give [place] a call if she changes her mind or has any further needs.

## 2022-07-12 ENCOUNTER — Other Ambulatory Visit (HOSPITAL_COMMUNITY): Payer: Self-pay

## 2022-07-12 ENCOUNTER — Encounter: Payer: Self-pay | Admitting: Oncology

## 2022-07-12 ENCOUNTER — Encounter: Payer: Self-pay | Admitting: Nurse Practitioner

## 2022-07-12 LAB — URINE CULTURE: Culture: >=100000 — AB

## 2022-07-12 NOTE — Telephone Encounter (Signed)
Per Rad onc nurse, April Luna, pt will have first XRT on 8/3.   Please schedule patient for lab/MD on 8/3 and inform pt of appt.

## 2022-07-14 ENCOUNTER — Encounter: Payer: Self-pay | Admitting: Medical Oncology

## 2022-07-14 ENCOUNTER — Inpatient Hospital Stay: Payer: Medicare Other

## 2022-07-14 ENCOUNTER — Inpatient Hospital Stay (HOSPITAL_BASED_OUTPATIENT_CLINIC_OR_DEPARTMENT_OTHER): Payer: Medicare Other | Admitting: Medical Oncology

## 2022-07-14 ENCOUNTER — Other Ambulatory Visit: Payer: Medicare Other

## 2022-07-14 ENCOUNTER — Other Ambulatory Visit: Payer: Self-pay

## 2022-07-14 ENCOUNTER — Encounter: Payer: Medicare Other | Admitting: Medical Oncology

## 2022-07-14 VITALS — BP 113/83 | HR 91 | Temp 96.0°F | Resp 16 | Wt 132.0 lb

## 2022-07-14 DIAGNOSIS — E860 Dehydration: Secondary | ICD-10-CM

## 2022-07-14 DIAGNOSIS — R809 Proteinuria, unspecified: Secondary | ICD-10-CM

## 2022-07-14 DIAGNOSIS — E1129 Type 2 diabetes mellitus with other diabetic kidney complication: Secondary | ICD-10-CM | POA: Diagnosis not present

## 2022-07-14 DIAGNOSIS — R350 Frequency of micturition: Secondary | ICD-10-CM

## 2022-07-14 DIAGNOSIS — C20 Malignant neoplasm of rectum: Secondary | ICD-10-CM | POA: Diagnosis not present

## 2022-07-14 LAB — URINALYSIS, COMPLETE (UACMP) WITH MICROSCOPIC
Bilirubin Urine: NEGATIVE
Glucose, UA: >=500 mg/dL — AB
Hgb urine dipstick: NEGATIVE
Ketones, ur: 5 mg/dL — AB
Nitrite: POSITIVE — AB
Protein, ur: 30 mg/dL — AB
Specific Gravity, Urine: 1.026 (ref 1.005–1.030)
pH: 6 (ref 5.0–8.0)

## 2022-07-14 LAB — BASIC METABOLIC PANEL
Anion gap: 7 (ref 5–15)
BUN: 16 mg/dL (ref 6–20)
CO2: 28 mmol/L (ref 22–32)
Calcium: 9.1 mg/dL (ref 8.9–10.3)
Chloride: 103 mmol/L (ref 98–111)
Creatinine, Ser: 1.3 mg/dL — ABNORMAL HIGH (ref 0.44–1.00)
GFR, Estimated: 50 mL/min — ABNORMAL LOW (ref >60)
Glucose, Bld: 234 mg/dL — ABNORMAL HIGH (ref 70–99)
Potassium: 4.1 mmol/L (ref 3.5–5.1)
Sodium: 138 mmol/L (ref 135–145)

## 2022-07-14 MED ORDER — NITROFURANTOIN MONOHYD MACRO 100 MG PO CAPS: 100.0000 mg | ORAL_CAPSULE | Freq: Two times a day (BID) | ORAL | 0 refills | Status: AC

## 2022-07-14 NOTE — Progress Notes (Unsigned)
Name: April Luna   MRN: [id number]    DOB: 1971-10-10   Date:07/17/2022       Progress Note  Subjective  Chief Complaint  Follow up   HPI  COPD:  She has chronic cough and sputum that is yellow, she also has wheezing usually at night and SOB with activity. She also had a sleep study that was positive for OSA but now she said unable to tolerate CPAP while at [facility]. She is still smoking, but down to half pack daily She is trying to quit . She is now seeing Dr. Wonda Amis   HTN: bp is  slightly up today but when on losartan 50 she got dizzy, we will keep her at 25 mg daily and monitor. She dneies chest pain or palpitation    Hyperlipidemia: she is now getting medication delivered pre-packaged to her house and has been more compliant, on crestor and vascepa Last LDL improved   GERD: currently no heartburn or indigestion , she is taking PPI, now seeing Dr. Marius Ditch , she stopped linzess due to colostomy pouch after rectal cancer    DMII: with dyslipidemia, on diet only, A1C it went from 6.2 % to 7.1 % and today is 7.4 %  she has eating healthier but still drinking sweet tea half a gallon daily.  She has  microalbuminuria and CKI stage III , she has dyslipidemia , she has been taking statins and ARB. Last GFR improved, due for urine micro . We will add Actos , discussed possible side effeccts    Bipolar disorder: she is seeing psychiatrist Dr. Loni Muse at [facility], she came in today with RN - Tomi Bamberger Smith-Fisher , that helps with medication management and also gives her monthly injections.  Last admission was 2016 for a psychotic episode. No history of suicide attempts. She is stable.    Hypothyroidism: she is taking levothyroxine and denies hair loss, no change in bowel movement, she has chronic dry skin. She states started to take levothyroxine due to lithium therapy years ago   Chronic right lower back pain: going on for years, she used to see chiropractor but cannot afford it,  seems to be right on  top of sacro- iliac joint, she states occasionally shoots down to her right lower leg. Taking gabapentin and prn tylenol and symptoms are stable. Stable   Psoriasis: seen by dermatologist in the past, Dr. Koleen Nimrod, she is now getting otc shampoo and is doing well controlling scalp itching unchanged   Rectal cancer: found during screening colonoscopy, diagnosed 01/2022 , she had robotic resection April 2022 , finished chemo and is now going to start on oral suppressive therapy on August 3 rd, 2023 . She is still immunosuppressed She has lack of appetite and weight loss   Atherosclerosis of Aorta: taking statins and fish oil, continue medication  Malnutrition: she has lost over 10 % of her weight since Oct 2022, weight was 151 lbs today she is down to 130 lbs. Lack of appetite, discussed protein shakes - glucerna - it is expensive and not covered by insurance   Patient Active Problem List   Diagnosis Date Noted   Atherosclerosis of aorta ([place]) 07/17/2022   Stage 3a chronic kidney disease ([place]) 07/17/2022   Thrombocytopenia ([place]) 06/24/2022   Elevated serum creatinine 06/23/2022   Weight loss 06/09/2022   Tobacco use 05/26/2022   Encounter for antineoplastic chemotherapy 05/17/2022   Goals of care, counseling/discussion 05/03/2022   Rectal cancer ([place]) 04/21/2022   Genetic  testing 03/27/2022   Family history of colon cancer 03/14/2022   Family history of uterine cancer 03/14/2022   Family history of breast cancer 03/14/2022   Screening for colon cancer    Rectal mass    Polyp of ascending colon    Obstructive sleep apnea syndrome 08/11/2021   Plantar callus 10/06/2019   Acquired hallux limitus of both feet 10/06/2019   Dyslipidemia associated with type 2 diabetes mellitus ([place]) 06/24/2019   Chronic obstructive pulmonary disease ([place]) 06/24/2019   Chronic constipation 06/24/2019   ASCUS of cervix with negative high risk HPV 09/05/2018   GERD without esophagitis 04/11/2018    Hyperlipidemia 04/11/2018   Essential hypertension 04/11/2018   Hypothyroidism 04/26/2015   Bipolar I disorder, most recent episode (or current) manic ([place]) 04/25/2015   Delirium due to another medical condition 04/25/2015   Cannabis abuse 04/25/2015    Past Surgical History:  Procedure Laterality Date   BREAST BIOPSY Left 12/14/2021   [place] bx, venus marker, path pending   CESAREAN SECTION     COLONOSCOPY WITH PROPOFOL N/A 02/09/2022   Procedure: COLONOSCOPY WITH PROPOFOL;  Surgeon: Lin Landsman, MD;  Location: [facility];  Service: Endoscopy;  Laterality: N/A;  sleep apnea   CYSTOSCOPY W/ RETROGRADES Left 11/08/2018   Procedure: CYSTOSCOPY WITH RETROGRADE PYELOGRAM;  Surgeon: Billey Co, MD;  Location: [facility];  Service: Urology;  Laterality: Left;   CYSTOSCOPY/URETEROSCOPY/HOLMIUM LASER/STENT PLACEMENT Left 11/08/2018   Procedure: CYSTOSCOPY/URETEROSCOPY/HOLMIUM LASER/STENT PLACEMENT;  Surgeon: Billey Co, MD;  Location: [facility];  Service: Urology;  Laterality: Left;   IR IMAGING GUIDED PORT INSERTION  05/11/2022   MOUTH SURGERY     wisdom teeth extraction   MOUTH SURGERY     teeth removal   POLYPECTOMY N/A 02/09/2022   Procedure: POLYPECTOMY;  Surgeon: Lin Landsman, MD;  Location: [facility];  Service: Endoscopy;  Laterality: N/A;   XI ROBOTIC ASSISTED LOWER ANTERIOR RESECTION N/A 04/21/2022   Procedure: XI ROBOTIC ASSISTED LOWER ANTERIOR RESECTION WITH COLOSTOMY, BILATERAL TAP BLOCK, ASSESSMENT OF TISSUE PERFUSSION WITH FIREFLY INJECTION;  Surgeon: Ileana Roup, MD;  Location: [facility];  Service: General;  Laterality: N/A;    Family History  Problem Relation Age of Onset   Depression Mother    Anxiety disorder Mother    Diabetes Mother    Hypertension Mother    Hyperlipidemia Mother    Cancer Mother    Uterine cancer Mother 56   Cervical cancer Mother 38   Colon cancer Father    Depression Brother    Anxiety disorder  Brother    Cancer Maternal Aunt        unk types   Diabetes Mellitus II Maternal Grandmother    Hypercholesterolemia Maternal Grandmother    Breast cancer Maternal Grandmother    Cancer Paternal Grandmother    Diabetes Paternal Grandmother    Melanoma Paternal Grandmother    Stomach cancer Paternal Grandmother     Social History   Tobacco Use   Smoking status: Every Day    Packs/day: 0.25    Years: 34.00    Total pack years: 8.50    Types: Cigarettes    Start date: 05/13/1985   Smokeless tobacco: Never   Tobacco comments:    1 PPD 04/20/223  Substance Use Topics   Alcohol use: Not Currently    Alcohol/week: 4.0 standard drinks of alcohol    Types: 4 Glasses of wine per week    Comment: quit ETOH in Feb.  2023     Current Outpatient Medications:    acetaminophen (TYLENOL) 325 MG tablet, Take 650 mg by mouth every 6 (six) hours as needed for headache (pain)., Disp: , Rfl:    albuterol (VENTOLIN HFA) 108 (90 Base) MCG/ACT inhaler, Inhale 2 puffs into the lungs every 6 (six) hours as needed for wheezing or shortness of breath., Disp: 1 each, Rfl: 5   amantadine (SYMMETREL) 100 MG capsule, Take 100 mg by mouth 2 (two) times daily., Disp: , Rfl:    blood glucose meter kit and supplies, Dispense based on patient and insurance preference. Use up to four times daily as directed. (FOR ICD-10 E10.9, E11.9)., Disp: 1 each, Rfl: 0   Budeson-Glycopyrrol-Formoterol (BREZTRI AEROSPHERE) 160-9-4.8 MCG/ACT AERO, Inhale 2 puffs into the lungs in the morning and at bedtime., Disp: 5.9 g, Rfl: 11   buPROPion (WELLBUTRIN XL) 150 MG 24 hr tablet, Take 150 mg by mouth every morning., Disp: , Rfl:    Cholecalciferol (VITAMIN D-3) 125 MCG (5000 UT) TABS, Take 5,000 Units by mouth daily., Disp: 30 tablet, Rfl: 1   FARXIGA 10 MG TABS tablet, TAKE 1 TABLET BY MOUTH DAILY BEFORE BREAKFAST., Disp: 30 tablet, Rfl: 2   gabapentin (NEURONTIN) 300 MG capsule, Take 300 mg by mouth 2 (two) times daily., Disp: ,  Rfl:    hydrocortisone cream 0.5 %, Apply 1 Application topically 2 (two) times daily as needed for itching., Disp: 30 g, Rfl: 1   Hydrocortisone, Perianal, 1 % CREA, Apply topically., Disp: , Rfl:    hydrOXYzine (VISTARIL) 50 MG capsule, Take 50 mg by mouth 3 (three) times daily as needed for anxiety., Disp: , Rfl:    icosapent Ethyl (VASCEPA) 1 g capsule, TAKE 2 CAPSULES BY MOUTH 2 TIMES DAILY, Disp: 120 capsule, Rfl: 3   levothyroxine (SYNTHROID) 75 MCG tablet, TAKE 1 TABLET BY MOUTH DAILY BEFORE BREAKFAST, Disp: 90 tablet, Rfl: 0   lidocaine-prilocaine (EMLA) cream, Apply to affected area once, Disp: 30 g, Rfl: 3   loratadine (CLARITIN) 10 MG tablet, Take 10 mg by mouth every morning., Disp: , Rfl:    nitrofurantoin, macrocrystal-monohydrate, (MACROBID) 100 MG capsule, Take 1 capsule (100 mg total) by mouth 2 (two) times daily for 7 days., Disp: 14 capsule, Rfl: 0   NON FORMULARY, Pt uses a cpap nightly, Disp: , Rfl:    nystatin (MYCOSTATIN) 100000 UNIT/ML suspension, Take 5 mLs (500,000 Units total) by mouth 4 (four) times daily. Swish and spit, Disp: 60 mL, Rfl: 0   ondansetron (ZOFRAN) 8 MG tablet, Take 1 tablet (8 mg total) by mouth 2 (two) times daily as needed for refractory nausea / vomiting. Start on day 3 after chemotherapy., Disp: 30 tablet, Rfl: 1   paliperidone (INVEGA SUSTENNA) 234 MG/1.5ML SUSY injection, Inject 234 mg into the muscle every 30 (thirty) days. On or about the 14th of each month, Disp: , Rfl:    pantoprazole (PROTONIX) 40 MG tablet, TAKE 1 TABLET BY MOUTH DAILY, Disp: 90 tablet, Rfl: 0   Pediatric Multivit-Minerals-C (CHEWABLES MULTIVITAMIN PO), Take 1 tablet by mouth daily., Disp: , Rfl:    pioglitazone (ACTOS) 15 MG tablet, Take 1 tablet (15 mg total) by mouth daily., Disp: 30 tablet, Rfl: 3   prochlorperazine (COMPAZINE) 10 MG tablet, Take 1 tablet (10 mg total) by mouth every 6 (six) hours as needed for nausea or vomiting., Disp: 60 tablet, Rfl: 3   sertraline  (ZOLOFT) 100 MG tablet, Take 200 mg by mouth every morning., Disp: ,  Rfl:    Spacer/Aero-Holding Chambers (AEROCHAMBER MV) inhaler, Use as instructed, Disp: 1 each, Rfl: 0   capecitabine (XELODA) 500 MG tablet, Take 2 tablets (1,000 mg total) by mouth 2 (two) times daily after a meal. Take along with 148m tablets. Take Monday- Friday. Take only on days of radiation. (Patient not taking: Reported on 07/14/2022), Disp: 112 tablet, Rfl: 0   losartan (COZAAR) 25 MG tablet, Take 1 tablet (25 mg total) by mouth at bedtime., Disp: 30 tablet, Rfl: 3   nicotine (NICODERM CQ - DOSED IN MG/24 HR) 7 mg/24hr patch, Place 1 patch (7 mg total) onto the skin daily. (Patient not taking: Reported on 07/14/2022), Disp: 28 patch, Rfl: 1   rosuvastatin (CRESTOR) 40 MG tablet, Take 1 tablet (40 mg total) by mouth every morning., Disp: 30 tablet, Rfl: 3  Allergies  Allergen Reactions   Metformin And Related Nausea And Vomiting   Nsaids Other (See Comments)    Stage 3 CKD   Perphenazine Other (See Comments)    Tremors, muscle weakness, tongue swelling   Sulfa Antibiotics Other (See Comments)    GI distress    Abilify [Aripiprazole] Rash   Penicillins Rash    She has taken amoxicillin without problems    I personally reviewed active problem list, medication list, allergies, family history, social history, health maintenance with the patient/caregiver today.   ROS  Constitutional: Negative for fever, positive for  weight change.  Respiratory: positive  for cough and shortness of breath.   Cardiovascular: Negative for chest pain or palpitations.  Gastrointestinal: Negative for abdominal pain, no bowel changes.  Musculoskeletal: Negative for gait problem or joint swelling.  Skin: Negative for rash.  Neurological: Negative for dizziness or headache.  No other specific complaints in a complete review of systems (except as listed in HPI above).   Objective  Vitals:   07/17/22 1248  BP: 132/84  Pulse: 100   Resp: 16  Temp: 98.6 F (37 C)  TempSrc: Oral  SpO2: 93%  Weight: 130 lb 1.6 oz (59 kg)  Height: '4\' 11"'  (1.499 m)    Body mass index is 26.28 kg/m.  Physical Exam  Constitutional: Patient appears well-developed and well-nourished. Obese  No distress.  HEENT: head atraumatic, normocephalic, pupils equal and reactive to light, neck supple, Cardiovascular: Normal rate, regular rhythm and normal heart sounds.  No murmur heard. No BLE edema. Pulmonary/Chest: Effort normal and breath sounds normal. No respiratory distress. Abdominal: Soft.  There is no tenderness.colostomy pouch LLQ Psychiatric: Patient has a normal mood and affect. behavior is normal. Judgment and thought content normal.     PHQ2/9:    07/17/2022    1:02 PM 03/15/2022    2:07 PM 12/02/2021    1:58 PM 10/24/2021   11:31 AM 10/06/2021   11:26 AM  Depression screen PHQ 2/9  Decreased Interest '2 1 1 1 ' 0  Down, Depressed, Hopeless 1 2 0 1 0  PHQ - 2 Score '3 3 1 2 ' 0  Altered sleeping 2 0  1 0  Tired, decreased energy '3 3  2 ' 0  Change in appetite '3 3  3 ' 0  Feeling bad or failure about yourself  '1 3  1 ' 0  Trouble concentrating 1 0  1 0  Moving slowly or fidgety/restless 2 0  1 0  Suicidal thoughts 0 0  0 0  PHQ-9 Score '15 12  11 ' 0  Difficult doing work/chores Somewhat difficult   Somewhat difficult Not difficult at all  phq 9 is positive   Fall Risk:    07/17/2022    1:29 PM 03/15/2022    2:07 PM 10/24/2021   11:30 AM 10/06/2021   11:25 AM 08/11/2021    9:41 AM  Fall Risk   Falls in the past year? 0 0 0 0 0  Number falls in past yr:  0  0 0  Injury with Fall?  0  0 0  Risk for fall due to : No Fall Risks No Fall Risks     Follow up Falls prevention discussed Falls prevention discussed Falls prevention discussed  Falls evaluation completed      Functional Status Survey: Is the patient deaf or have difficulty hearing?: Yes Does the patient have difficulty seeing, even when wearing glasses/contacts?:  No Does the patient have difficulty concentrating, remembering, or making decisions?: No Does the patient have difficulty walking or climbing stairs?: No Does the patient have difficulty dressing or bathing?: No Does the patient have difficulty doing errands alone such as visiting a doctor's office or shopping?: No    Assessment & Plan  1. Type 2 diabetes mellitus with microalbuminuria, without long-term current use of insulin (HCC)  - POCT HgB A1C - losartan (COZAAR) 25 MG tablet; Take 1 tablet (25 mg total) by mouth at bedtime.  Dispense: 30 tablet; Refill: 3 - pioglitazone (ACTOS) 15 MG tablet; Take 1 tablet (15 mg total) by mouth daily.  Dispense: 30 tablet; Refill: 3 - Ambulatory referral to Home Health  2. Rectal cancer ([place])  - Ambulatory referral to Home Health  3. Dyslipidemia associated with type 2 diabetes mellitus (HCC)  - rosuvastatin (CRESTOR) 40 MG tablet; Take 1 tablet (40 mg total) by mouth every morning.  Dispense: 30 tablet; Refill: 3  4. Bipolar I disorder, most recent episode (or current) manic ([place])  - Ambulatory referral to [facility]  5. Stage 3a chronic kidney disease (HCC)  - Urine Microalbumin w/creat. ratio  6. Mucopurulent chronic bronchitis ([place])  Seeing pulmonologist   7. Atherosclerosis of aorta ([place])  On statin therapy   8. Immunosuppression ([place])  Under the care of hematologist for rectal cancer  9. Obstructive sleep apnea syndrome  Unable to tolerate CPAP  10. Moderate protein-calorie malnutrition ([place])  Discussed high protein diet   11. GERD without esophagitis   12. Essential hypertension  - losartan (COZAAR) 25 MG tablet; Take 1 tablet (25 mg total) by mouth at bedtime.  Dispense: 30 tablet; Refill: 3  13. Hypothyroidism due to medication  - TSH

## 2022-07-14 NOTE — Progress Notes (Addendum)
Symptom Management [place] at [facility] [phone] [phone]  Patient Care Team: Steele Sizer, MD as PCP - General (Family Medicine) Sharmaine Base, MD as Referring Physician (Psychiatry) Bjorn Loser, MD as Consulting Physician (Urology) Germaine Pomfret, [facility] (Pharmacist) Clent Jacks, RN as Oncology Nurse Navigator   Name of the patient: April Luna  [id number]  Mar 26, 1971   Date of visit: 07/14/22  Reason for Consult: April Luna is a 51 y.o. female who presents today for:  UTI: Patient was last seen on 07/10/2022 for nausea after her chemotherapy pump disconnect. She was suspected to have a uti and was started on Macrobid. She presents today for follow up. She reports that she has not started the ABX yet as the pharmacy has not delivered it. NO fevers, vomiting, significant pain.    PAST MEDICAL HISTORY: Past Medical History:  Diagnosis Date   Allergy    pollen   Anxiety    Arthritis    right hip   Bipolar 1 disorder ([place])    Cancer (HCC)    rectal   Chronic kidney disease    COPD (chronic obstructive pulmonary disease) (HCC)    Depression    Family history of adverse reaction to anesthesia    grand father had a stroke during anesthesia   Family history of breast cancer    Family history of colon cancer    Family history of uterine cancer    GERD (gastroesophageal reflux disease)    History of kidney stones    Hyperlipidemia    Hypertension    Hypothyroidism    Panic attack    Pneumonia    Psoriasis    Sleep apnea 08/11/2021   No CPAP   Type 2 diabetes mellitus with microalbuminuria, without long-term current use of insulin ([place]) 06/24/2019    PAST SURGICAL HISTORY:  Past Surgical History:  Procedure Laterality Date   BREAST BIOPSY Left 12/14/2021   [place] bx, venus marker, path pending   CESAREAN SECTION     COLONOSCOPY WITH PROPOFOL N/A 02/09/2022   Procedure:  COLONOSCOPY WITH PROPOFOL;  Surgeon: Lin Landsman, MD;  Location: [place];  Service: Endoscopy;  Laterality: N/A;  sleep apnea   CYSTOSCOPY W/ RETROGRADES Left 11/08/2018   Procedure: CYSTOSCOPY WITH RETROGRADE PYELOGRAM;  Surgeon: Billey Co, MD;  Location: [facility];  Service: Urology;  Laterality: Left;   CYSTOSCOPY/URETEROSCOPY/HOLMIUM LASER/STENT PLACEMENT Left 11/08/2018   Procedure: CYSTOSCOPY/URETEROSCOPY/HOLMIUM LASER/STENT PLACEMENT;  Surgeon: Billey Co, MD;  Location: [facility];  Service: Urology;  Laterality: Left;   IR IMAGING GUIDED PORT INSERTION  05/11/2022   MOUTH SURGERY     wisdom teeth extraction   MOUTH SURGERY     teeth removal   POLYPECTOMY N/A 02/09/2022   Procedure: POLYPECTOMY;  Surgeon: Lin Landsman, MD;  Location: [place];  Service: Endoscopy;  Laterality: N/A;   XI ROBOTIC ASSISTED LOWER ANTERIOR RESECTION N/A 04/21/2022   Procedure: XI ROBOTIC ASSISTED LOWER ANTERIOR RESECTION WITH COLOSTOMY, BILATERAL TAP BLOCK, ASSESSMENT OF TISSUE PERFUSSION WITH FIREFLY INJECTION;  Surgeon: Ileana Roup, MD;  Location: [facility];  Service: General;  Laterality: N/A;    HEMATOLOGY/ONCOLOGY HISTORY:  Oncology History  Rectal cancer ([place])  02/26/2022 Imaging   MRI PELVIS WITHOUT CONTRAST- By imaging, rectal cancer stage:  T1/T2, N0, Mx    03/02/2022 Imaging   CT CHEST AND ABDOMEN WITH CONTRAST 1. No convincing evidence of metastatic disease within the chest or  abdomen. 2. Atrophic left kidney with multifocal renal scarring and cortical calcifications as well as nonobstructive renal stones measuring up to 5 mm. 3. Prominent left-sided predominant retroperitoneal lymph nodes measuring up to 8 mm near the level of the renal hilum, overall decreased in size dating back to CT September 19, 2018 and favored reactive related to left renal inflammation. 4.  Aortic Atherosclerosis (ICD10-I70.0).   03/27/2022 Genetic Testing    [place]  CustomNext+RNA cancer panel found no pathogenic mutations.    04/21/2022 Initial Diagnosis   Rectal cancer - baseline CEA 3.6 -02/09/2022, patient had colonoscopy which showed renal mass 10 cm from anal verge.  5 mm polyp in ascending colon.  Removed and retrieved. Pathology showed rectal adenocarcinoma.  The polyp in the ascending colon is a tubular adenoma. -04/21/2022, patient underwent robotic assisted ultralow anterior resection. Pathology showed moderately differentiated adenocarcinoma, 4.5 cm in maximal extent, with focal extension through muscularis propria into perirectal soft tissue.  3 lymph nodes positive for metastatic carcinoma.  Negative margin.  pT3 pN1b, MSI stable.   05/03/2022 Cancer Staging   Staging form: Colon and Rectum, AJCC 8th Edition - Pathologic stage from 05/03/2022: Stage IIIB (pT3, pN1b, cM0) - Signed by [name], MD on 05/03/2022 Stage prefix: Initial diagnosis   05/11/2022 Miscellaneous   Medi port placed by Dr.White   05/26/2022 -  Chemotherapy   FOLFOX every 2 weeks.      ALLERGIES:  is allergic to metformin and related, nsaids, perphenazine, sulfa antibiotics, abilify [aripiprazole], and penicillins.  MEDICATIONS:  Current Outpatient Medications  Medication Sig Dispense Refill   acetaminophen (TYLENOL) 325 MG tablet Take 650 mg by mouth every 6 (six) hours as needed for headache (pain).     albuterol (VENTOLIN HFA) 108 (90 Base) MCG/ACT inhaler Inhale 2 puffs into the lungs every 6 (six) hours as needed for wheezing or shortness of breath. 1 each 5   amantadine (SYMMETREL) 100 MG capsule Take 100 mg by mouth 2 (two) times daily.     blood glucose meter kit and supplies Dispense based on patient and insurance preference. Use up to four times daily as directed. (FOR ICD-10 E10.9, E11.9). 1 each 0   Budeson-Glycopyrrol-Formoterol (BREZTRI AEROSPHERE) 160-9-4.8 MCG/ACT AERO Inhale 2 puffs into the lungs in the morning and at bedtime. 5.9 g 11   buPROPion  (WELLBUTRIN XL) 150 MG 24 hr tablet Take 150 mg by mouth every morning.     Cholecalciferol (VITAMIN D-3) 125 MCG (5000 UT) TABS Take 5,000 Units by mouth daily. 30 tablet 1   FARXIGA 10 MG TABS tablet TAKE 1 TABLET BY MOUTH DAILY BEFORE BREAKFAST. 30 tablet 2   gabapentin (NEURONTIN) 300 MG capsule Take 300 mg by mouth 2 (two) times daily.     hydrocortisone cream 0.5 % Apply 1 Application topically 2 (two) times daily as needed for itching. 30 g 1   Hydrocortisone, Perianal, 1 % CREA Apply topically.     hydrOXYzine (VISTARIL) 50 MG capsule Take 50 mg by mouth 3 (three) times daily as needed for anxiety.     icosapent Ethyl (VASCEPA) 1 g capsule TAKE 2 CAPSULES BY MOUTH 2 TIMES DAILY 120 capsule 3   levothyroxine (SYNTHROID) 75 MCG tablet TAKE 1 TABLET BY MOUTH DAILY BEFORE BREAKFAST 90 tablet 0   lidocaine-prilocaine (EMLA) cream Apply to affected area once 30 g 3   linaclotide (LINZESS) 145 MCG CAPS capsule TAKE 1 CAPSULE BY MOUTH DAILY BEFORE BREAKFAST 30 capsule 0   loratadine (CLARITIN) 10  MG tablet Take 10 mg by mouth every morning.     losartan (COZAAR) 25 MG tablet TAKE 1 TABLET BY MOUTH DAILY 30 tablet 3   NON FORMULARY Pt uses a cpap nightly     nystatin (MYCOSTATIN) 100000 UNIT/ML suspension Take 5 mLs (500,000 Units total) by mouth 4 (four) times daily. Swish and spit 60 mL 0   ondansetron (ZOFRAN) 8 MG tablet Take 1 tablet (8 mg total) by mouth 2 (two) times daily as needed for refractory nausea / vomiting. Start on day 3 after chemotherapy. 30 tablet 1   paliperidone (INVEGA SUSTENNA) 234 MG/1.5ML SUSY injection Inject 234 mg into the muscle every 30 (thirty) days. On or about the 14th of each month     pantoprazole (PROTONIX) 40 MG tablet TAKE 1 TABLET BY MOUTH DAILY 90 tablet 0   Pediatric Multivit-Minerals-C (CHEWABLES MULTIVITAMIN PO) Take 1 tablet by mouth daily.     prochlorperazine (COMPAZINE) 10 MG tablet Take 1 tablet (10 mg total) by mouth every 6 (six) hours as needed  for nausea or vomiting. 60 tablet 3   rosuvastatin (CRESTOR) 40 MG tablet Take 1 tablet (40 mg total) by mouth daily. (Patient taking differently: Take 40 mg by mouth every morning.) 30 tablet 3   sertraline (ZOLOFT) 100 MG tablet Take 200 mg by mouth every morning.     Spacer/Aero-Holding Chambers (AEROCHAMBER MV) inhaler Use as instructed 1 each 0   capecitabine (XELODA) 150 MG tablet Take 2 tablets (300 mg total) by mouth 2 (two) times daily after a meal. Take along with 545m tablets. Take Monday- Friday. Take only on days of radiation. (Patient not taking: Reported on 07/14/2022) 112 tablet 0   capecitabine (XELODA) 500 MG tablet Take 2 tablets (1,000 mg total) by mouth 2 (two) times daily after a meal. Take along with 1537mtablets. Take Monday- Friday. Take only on days of radiation. (Patient not taking: Reported on 07/14/2022) 112 tablet 0   guaiFENesin (MUCINEX) 600 MG 12 hr tablet Take 2 tablets (1,200 mg total) by mouth 2 (two) times daily as needed for to loosen phlegm or cough. (Patient not taking: Reported on 07/14/2022)     nicotine (NICODERM CQ - DOSED IN MG/24 HR) 7 mg/24hr patch Place 1 patch (7 mg total) onto the skin daily. (Patient not taking: Reported on 07/14/2022) 28 patch 1   nitrofurantoin, macrocrystal-monohydrate, (MACROBID) 100 MG capsule Take 1 capsule (100 mg total) by mouth 2 (two) times daily for 7 days. 14 capsule 0   potassium chloride (KLOR-CON M) 10 MEQ tablet Take 1 tablet (10 mEq total) by mouth daily. (Patient not taking: Reported on 07/14/2022) 14 tablet 0   No current facility-administered medications for this visit.    VITAL SIGNS: BP 113/83 (BP Location: Left Arm, Patient Position: Sitting)   Pulse 91   Temp (!) 96 F (35.6 C) (Tympanic)   Resp 16   Wt 132 lb (59.9 kg)   SpO2 98%   BMI 26.66 kg/m  Filed Weights   07/14/22 1133  Weight: 132 lb (59.9 kg)    Estimated body mass index is 26.66 kg/m as calculated from the following:   Height as of  06/19/22: '4\' 11"'  (1.499 m).   Weight as of this encounter: 132 lb (59.9 kg).  LABS: CBC:    Component Value Date/Time   WBC 5.1 07/10/2022 1335   HGB 15.5 (H) 07/10/2022 1335   HGB 13.2 04/20/2015 0640   HCT 46.3 (H) 07/10/2022 1335  HCT 39.2 04/20/2015 0640   PLT 115 (L) 07/10/2022 1335   PLT 165 04/20/2015 0640   MCV 93.3 07/10/2022 1335   MCV 96 04/20/2015 0640   NEUTROABS 3.3 07/10/2022 1335   NEUTROABS 4.2 04/20/2015 0640   LYMPHSABS 1.5 07/10/2022 1335   LYMPHSABS 2.4 04/20/2015 0640   MONOABS 0.2 07/10/2022 1335   MONOABS 0.6 04/20/2015 0640   EOSABS 0.1 07/10/2022 1335   EOSABS 0.2 04/20/2015 0640   BASOSABS 0.0 07/10/2022 1335   BASOSABS 0.1 04/20/2015 0640   Comprehensive Metabolic Panel:    Component Value Date/Time   NA 139 07/07/2022 0821   NA 139 04/20/2015 0640   K 4.0 07/07/2022 0821   K 3.9 04/20/2015 0640   CL 102 07/07/2022 0821   CL 105 04/20/2015 0640   CO2 27 07/07/2022 0821   CO2 27 04/20/2015 0640   BUN 15 07/07/2022 0821   BUN 12 04/20/2015 0640   CREATININE 1.08 (H) 07/07/2022 0821   CREATININE 0.94 08/11/2021 1031   GLUCOSE 196 (H) 07/07/2022 0821   GLUCOSE 95 04/20/2015 0640   CALCIUM 9.0 07/07/2022 0821   CALCIUM 9.2 04/20/2015 0640   AST 18 07/07/2022 0821   AST 29 04/20/2015 0640   ALT 14 07/07/2022 0821   ALT 30 04/20/2015 0640   ALKPHOS 89 07/07/2022 0821   ALKPHOS 80 04/20/2015 0640   BILITOT 0.6 07/07/2022 0821   BILITOT 0.2 (L) 04/20/2015 0640   PROT 6.7 07/07/2022 0821   PROT 6.5 04/20/2015 0640   ALBUMIN 3.8 07/07/2022 0821   ALBUMIN 3.7 04/20/2015 0640    RADIOGRAPHIC STUDIES: No results found.  PERFORMANCE STATUS (ECOG) : 1 - Symptomatic but completely ambulatory  Review of Systems Unless otherwise noted, a complete review of systems is negative.  Physical Exam General: NAD Cardiovascular: regular rate and rhythm Pulmonary: clear ant fields Abdomen: soft, nontender, + bowel sounds, no CVA tenderness GU:  no suprapubic tenderness Extremities: no edema, no joint deformities Skin: no rashes Neurological: Weakness but otherwise nonfocal  Assessment and Plan- Patient is a 51 y.o. female    Encounter Diagnoses  Name Primary?   Urinary frequency Yes   Type 2 diabetes mellitus with microalbuminuria, without long-term current use of insulin (HCC)    Dehydration      New. Urinalysis shows glucosuria, ketones, protein and leukocytes. Likely beginning UTI causing dehydration and nausea along with elevated heart rate due to dehydration. She is on farxiga so the glucosuria can be explained. I called patient and had her check her fasting blood sugar which was 210. She is afebrile without WBC shift. Given her status will start her on prophylactic ABX -macrobid-while we await culture results. She will hydrate with water and keep a close eye on her blood sugar levels. Follow up this week in clinic. Red flags discussed. * no injectable ABX available here but suggested- recommended consider seeing urgent care      Patient expressed understanding and was in agreement with this plan. She also understands that She can call clinic at any time with any questions, concerns, or complaints.   Thank you for allowing me to participate in the care of this very pleasant patient.   Time Total: 25  Visit consisted of counseling and education dealing with the complex and emotionally intense issues of symptom management in the setting of serious illness.Greater than 50%  of this time was spent counseling and coordinating care related to the above assessment and plan.  Signed by: Nelwyn Salisbury, PA-C

## 2022-07-14 NOTE — Progress Notes (Signed)
Pt in for follow up, last seen 07/10/22.  Pt reports still having burning and stinging with and without urination. Pt reports she has been doing sitz baths and has not started antibiotic yet.

## 2022-07-14 NOTE — Progress Notes (Signed)
Nutrition Assessment:  51 year old female with stage III adenocarcinoma of rectum.  S/p colostomy 04/21/22, DM, COPD, GERD, HTN, HLD, bipolar, schizophrenia.  Patient has completed 4 cycles of folfox.  Planning to start radiation and xeloda.    Met with patient following visit with PA.  Patient with UTI and not feeling well.  Says that her appetite has been down since starting chemotherapy.  Reports mouth sores and using nystatin rinses.  Denies nausea.     Medications: reviewed  Labs: reviewed  Anthropometrics:   Height: 4'11" Weight: 132 lb 6.4 oz today 149 lb on 3/22 BMI: 26  11% weight loss in the last 4 months  Estimated Energy Needs  Kcals: 1500-1800 Protein: 75-90 g Fluid: 1500-1800 ml  NUTRITION DIAGNOSIS: Inadequate oral intake related to cancer related treatment side effects and UTI as evidenced by 11% weight loss and decreased intake   INTERVENTION:  Discussed foods to choose with sore mouth.  Handout on soft moist foods given Samples of glucerna shake and coupons given to patient     MONITORING, EVALUATION, GOAL: weight trends, intake   NEXT VISIT: Thursday, August 3rd after MD appointment  Avalee Castrellon B. Zenia Resides, [place], [place] Registered Dietitian [phone]

## 2022-07-17 ENCOUNTER — Other Ambulatory Visit: Payer: Self-pay | Admitting: *Deleted

## 2022-07-17 ENCOUNTER — Ambulatory Visit (INDEPENDENT_AMBULATORY_CARE_PROVIDER_SITE_OTHER): Payer: Medicare Other | Admitting: Family Medicine

## 2022-07-17 ENCOUNTER — Other Ambulatory Visit: Payer: Self-pay

## 2022-07-17 ENCOUNTER — Encounter: Payer: Self-pay | Admitting: Family Medicine

## 2022-07-17 ENCOUNTER — Inpatient Hospital Stay: Payer: Medicare Other

## 2022-07-17 ENCOUNTER — Inpatient Hospital Stay: Payer: Medicare Other | Admitting: Hospice and Palliative Medicine

## 2022-07-17 VITALS — BP 132/84 | HR 100 | Temp 98.6°F | Resp 16 | Ht 59.0 in | Wt 130.1 lb

## 2022-07-17 DIAGNOSIS — E785 Hyperlipidemia, unspecified: Secondary | ICD-10-CM

## 2022-07-17 DIAGNOSIS — C20 Malignant neoplasm of rectum: Secondary | ICD-10-CM

## 2022-07-17 DIAGNOSIS — R809 Proteinuria, unspecified: Secondary | ICD-10-CM

## 2022-07-17 DIAGNOSIS — F3110 Bipolar disorder, current episode manic without psychotic features, unspecified: Secondary | ICD-10-CM

## 2022-07-17 DIAGNOSIS — D849 Immunodeficiency, unspecified: Secondary | ICD-10-CM

## 2022-07-17 DIAGNOSIS — E1129 Type 2 diabetes mellitus with other diabetic kidney complication: Secondary | ICD-10-CM | POA: Diagnosis not present

## 2022-07-17 DIAGNOSIS — E032 Hypothyroidism due to medicaments and other exogenous substances: Secondary | ICD-10-CM

## 2022-07-17 DIAGNOSIS — I10 Essential (primary) hypertension: Secondary | ICD-10-CM

## 2022-07-17 DIAGNOSIS — E1169 Type 2 diabetes mellitus with other specified complication: Secondary | ICD-10-CM

## 2022-07-17 DIAGNOSIS — J411 Mucopurulent chronic bronchitis: Secondary | ICD-10-CM

## 2022-07-17 DIAGNOSIS — I700 Atherosclerosis of aorta: Secondary | ICD-10-CM

## 2022-07-17 DIAGNOSIS — G4733 Obstructive sleep apnea (adult) (pediatric): Secondary | ICD-10-CM

## 2022-07-17 DIAGNOSIS — E440 Moderate protein-calorie malnutrition: Secondary | ICD-10-CM

## 2022-07-17 DIAGNOSIS — R350 Frequency of micturition: Secondary | ICD-10-CM

## 2022-07-17 DIAGNOSIS — N1831 Chronic kidney disease, stage 3a: Secondary | ICD-10-CM

## 2022-07-17 DIAGNOSIS — K219 Gastro-esophageal reflux disease without esophagitis: Secondary | ICD-10-CM

## 2022-07-17 LAB — POCT GLYCOSYLATED HEMOGLOBIN (HGB A1C): Hemoglobin A1C: 7.4 % — AB (ref 4.0–5.6)

## 2022-07-17 MED ORDER — PIOGLITAZONE HCL 15 MG PO TABS: 15.0000 mg | ORAL_TABLET | Freq: Every day | ORAL | 3 refills | Status: DC

## 2022-07-17 MED ORDER — ROSUVASTATIN CALCIUM 40 MG PO TABS: 40.0000 mg | ORAL_TABLET | Freq: Every morning | ORAL | 3 refills | Status: DC

## 2022-07-17 MED ORDER — LOSARTAN POTASSIUM 25 MG PO TABS: 25.0000 mg | ORAL_TABLET | Freq: Every day | ORAL | 3 refills | Status: DC

## 2022-07-18 ENCOUNTER — Ambulatory Visit: Payer: Medicare Other

## 2022-07-18 ENCOUNTER — Encounter (HOSPITAL_COMMUNITY): Payer: Self-pay | Admitting: Nurse Practitioner

## 2022-07-18 ENCOUNTER — Other Ambulatory Visit: Payer: Self-pay

## 2022-07-18 LAB — MICROALBUMIN / CREATININE URINE RATIO
Creatinine, Urine: 73 mg/dL (ref 20–275)
Microalb Creat Ratio: 36 mcg/mg creat — ABNORMAL HIGH (ref <30)
Microalb, Ur: 2.6 mg/dL

## 2022-07-18 LAB — TSH: TSH: 2.2 mIU/L

## 2022-07-19 ENCOUNTER — Inpatient Hospital Stay: Payer: Medicare Other | Admitting: Pharmacist

## 2022-07-19 ENCOUNTER — Ambulatory Visit
Admission: RE | Admit: 2022-07-19 | Discharge: 2022-07-19 | Disposition: A | Discharge status: Home / Self Care | Payer: Medicare Other | Source: Ambulatory Visit | Attending: Radiation Oncology | Admitting: Radiation Oncology

## 2022-07-19 ENCOUNTER — Other Ambulatory Visit (HOSPITAL_COMMUNITY): Payer: Self-pay

## 2022-07-19 DIAGNOSIS — C20 Malignant neoplasm of rectum: Secondary | ICD-10-CM | POA: Diagnosis present

## 2022-07-19 DIAGNOSIS — Z510 Encounter for antineoplastic radiation therapy: Secondary | ICD-10-CM | POA: Insufficient documentation

## 2022-07-19 NOTE — Progress Notes (Signed)
[place]  Telephone:([phone] [phone]  Patient Care Team: Steele Sizer, MD as PCP - General (Family Medicine) Sharmaine Base, MD as Referring Physician (Psychiatry) Bjorn Loser, MD as Consulting Physician (Urology) Germaine Pomfret, [facility] (Pharmacist) Clent Jacks, RN as Oncology Nurse Navigator   Name of the patient: April Luna  [id number]  01-Jun-1971   Date of visit: 07/19/22  HPI: Patient is a 51 y.o. female with stage III rectal cancer. Patient is now s/p FOLFOX and with begin Xeloda (capecitabine) and XRT on 07/27/22.   Reason for Consult: Capecitabine oral chemotherapy education.   PAST MEDICAL HISTORY: Past Medical History:  Diagnosis Date   Allergy    pollen   Anxiety    Arthritis    right hip   Bipolar 1 disorder ([place])    Cancer (HCC)    rectal   Chronic kidney disease    COPD (chronic obstructive pulmonary disease) (HCC)    Depression    Family history of adverse reaction to anesthesia    grand father had a stroke during anesthesia   Family history of breast cancer    Family history of colon cancer    Family history of uterine cancer    GERD (gastroesophageal reflux disease)    History of kidney stones    Hyperlipidemia    Hypertension    Hypothyroidism    Panic attack    Pneumonia    Psoriasis    Sleep apnea 08/11/2021   No CPAP   Type 2 diabetes mellitus with microalbuminuria, without long-term current use of insulin ([place]) 06/24/2019    HEMATOLOGY/ONCOLOGY HISTORY:  Oncology History  Rectal cancer ([place])  02/26/2022 Imaging   MRI PELVIS WITHOUT CONTRAST- By imaging, rectal cancer stage:  T1/T2, N0, Mx    03/02/2022 Imaging   CT CHEST AND ABDOMEN WITH CONTRAST 1. No convincing evidence of metastatic disease within the chest or abdomen. 2. Atrophic left kidney with multifocal renal scarring and cortical calcifications as well as nonobstructive renal stones measuring up  to 5 mm. 3. Prominent left-sided predominant retroperitoneal lymph nodes measuring up to 8 mm near the level of the renal hilum, overall decreased in size dating back to CT September 19, 2018 and favored reactive related to left renal inflammation. 4.  Aortic Atherosclerosis (ICD10-I70.0).   03/27/2022 Genetic Testing    [facility] CustomNext+RNA cancer panel found no pathogenic mutations.    04/21/2022 Initial Diagnosis   Rectal cancer - baseline CEA 3.6 -02/09/2022, patient had colonoscopy which showed renal mass 10 cm from anal verge.  5 mm polyp in ascending colon.  Removed and retrieved. Pathology showed rectal adenocarcinoma.  The polyp in the ascending colon is a tubular adenoma. -04/21/2022, patient underwent robotic assisted ultralow anterior resection. Pathology showed moderately differentiated adenocarcinoma, 4.5 cm in maximal extent, with focal extension through muscularis propria into perirectal soft tissue.  3 lymph nodes positive for metastatic carcinoma.  Negative margin.  pT3 pN1b, MSI stable.   05/03/2022 Cancer Staging   Staging form: Colon and Rectum, AJCC 8th Edition - Pathologic stage from 05/03/2022: Stage IIIB (pT3, pN1b, cM0) - Signed by [name], MD on 05/03/2022 Stage prefix: Initial diagnosis   05/11/2022 Miscellaneous   Medi port placed by Dr.White   05/26/2022 -  Chemotherapy   FOLFOX every 2 weeks.      ALLERGIES:  is allergic to metformin and related, nsaids, perphenazine, sulfa antibiotics, abilify [aripiprazole], and penicillins.  MEDICATIONS:  Current Outpatient Medications  Medication  Sig Dispense Refill   acetaminophen (TYLENOL) 325 MG tablet Take 650 mg by mouth every 6 (six) hours as needed for headache (pain).     albuterol (VENTOLIN HFA) 108 (90 Base) MCG/ACT inhaler Inhale 2 puffs into the lungs every 6 (six) hours as needed for wheezing or shortness of breath. 1 each 5   amantadine (SYMMETREL) 100 MG capsule Take 100 mg by mouth 2 (two) times daily.      blood glucose meter kit and supplies Dispense based on patient and insurance preference. Use up to four times daily as directed. (FOR ICD-10 E10.9, E11.9). 1 each 0   Budeson-Glycopyrrol-Formoterol (BREZTRI AEROSPHERE) 160-9-4.8 MCG/ACT AERO Inhale 2 puffs into the lungs in the morning and at bedtime. 5.9 g 11   buPROPion (WELLBUTRIN XL) 150 MG 24 hr tablet Take 150 mg by mouth every morning.     capecitabine (XELODA) 500 MG tablet Take 2 tablets (1,000 mg total) by mouth 2 (two) times daily after a meal. Take along with 149m tablets. Take Monday- Friday. Take only on days of radiation. (Patient not taking: Reported on 07/14/2022) 112 tablet 0   Cholecalciferol (VITAMIN D-3) 125 MCG (5000 UT) TABS Take 5,000 Units by mouth daily. 30 tablet 1   FARXIGA 10 MG TABS tablet TAKE 1 TABLET BY MOUTH DAILY BEFORE BREAKFAST. 30 tablet 2   gabapentin (NEURONTIN) 300 MG capsule Take 300 mg by mouth 2 (two) times daily.     hydrocortisone cream 0.5 % Apply 1 Application topically 2 (two) times daily as needed for itching. 30 g 1   Hydrocortisone, Perianal, 1 % CREA Apply topically.     hydrOXYzine (VISTARIL) 50 MG capsule Take 50 mg by mouth 3 (three) times daily as needed for anxiety.     icosapent Ethyl (VASCEPA) 1 g capsule TAKE 2 CAPSULES BY MOUTH 2 TIMES DAILY 120 capsule 3   levothyroxine (SYNTHROID) 75 MCG tablet TAKE 1 TABLET BY MOUTH DAILY BEFORE BREAKFAST 90 tablet 0   lidocaine-prilocaine (EMLA) cream Apply to affected area once 30 g 3   loratadine (CLARITIN) 10 MG tablet Take 10 mg by mouth every morning.     losartan (COZAAR) 25 MG tablet Take 1 tablet (25 mg total) by mouth at bedtime. 30 tablet 3   nicotine (NICODERM CQ - DOSED IN MG/24 HR) 7 mg/24hr patch Place 1 patch (7 mg total) onto the skin daily. (Patient not taking: Reported on 07/14/2022) 28 patch 1   nitrofurantoin, macrocrystal-monohydrate, (MACROBID) 100 MG capsule Take 1 capsule (100 mg total) by mouth 2 (two) times daily for 7 days. 14  capsule 0   NON FORMULARY Pt uses a cpap nightly     nystatin (MYCOSTATIN) 100000 UNIT/ML suspension Take 5 mLs (500,000 Units total) by mouth 4 (four) times daily. Swish and spit 60 mL 0   ondansetron (ZOFRAN) 8 MG tablet Take 1 tablet (8 mg total) by mouth 2 (two) times daily as needed for refractory nausea / vomiting. Start on day 3 after chemotherapy. 30 tablet 1   paliperidone (INVEGA SUSTENNA) 234 MG/1.5ML SUSY injection Inject 234 mg into the muscle every 30 (thirty) days. On or about the 14th of each month     pantoprazole (PROTONIX) 40 MG tablet TAKE 1 TABLET BY MOUTH DAILY 90 tablet 0   Pediatric Multivit-Minerals-C (CHEWABLES MULTIVITAMIN PO) Take 1 tablet by mouth daily.     pioglitazone (ACTOS) 15 MG tablet Take 1 tablet (15 mg total) by mouth daily. 30 tablet 3   prochlorperazine (  COMPAZINE) 10 MG tablet Take 1 tablet (10 mg total) by mouth every 6 (six) hours as needed for nausea or vomiting. 60 tablet 3   rosuvastatin (CRESTOR) 40 MG tablet Take 1 tablet (40 mg total) by mouth every morning. 30 tablet 3   sertraline (ZOLOFT) 100 MG tablet Take 200 mg by mouth every morning.     Spacer/Aero-Holding Chambers (AEROCHAMBER MV) inhaler Use as instructed 1 each 0   No current facility-administered medications for this visit.    VITAL SIGNS: There were no vitals taken for this visit. There were no vitals filed for this visit.  Estimated body mass index is 26.28 kg/m as calculated from the following:   Height as of 07/17/22: _0  (1.499 m).   Weight as of 07/17/22: 59 kg (130 lb 1.6 oz).  LABS: CBC:    Component Value Date/Time   WBC 5.1 07/10/2022 1335   HGB 15.5 (H) 07/10/2022 1335   HGB 13.2 04/20/2015 0640   HCT 46.3 (H) 07/10/2022 1335   HCT 39.2 04/20/2015 0640   PLT 115 (L) 07/10/2022 1335   PLT 165 04/20/2015 0640   MCV 93.3 07/10/2022 1335   MCV 96 04/20/2015 0640   NEUTROABS 3.3 07/10/2022 1335   NEUTROABS 4.2 04/20/2015 0640   LYMPHSABS 1.5 07/10/2022 1335    LYMPHSABS 2.4 04/20/2015 0640   MONOABS 0.2 07/10/2022 1335   MONOABS 0.6 04/20/2015 0640   EOSABS 0.1 07/10/2022 1335   EOSABS 0.2 04/20/2015 0640   BASOSABS 0.0 07/10/2022 1335   BASOSABS 0.1 04/20/2015 0640   Comprehensive Metabolic Panel:    Component Value Date/Time   NA 138 07/14/2022 1050   NA 139 04/20/2015 0640   K 4.1 07/14/2022 1050   K 3.9 04/20/2015 0640   CL 103 07/14/2022 1050   CL 105 04/20/2015 0640   CO2 28 07/14/2022 1050   CO2 27 04/20/2015 0640   BUN 16 07/14/2022 1050   BUN 12 04/20/2015 0640   CREATININE 1.30 (H) 07/14/2022 1050   CREATININE 0.94 08/11/2021 1031   GLUCOSE 234 (H) 07/14/2022 1050   GLUCOSE 95 04/20/2015 0640   CALCIUM 9.1 07/14/2022 1050   CALCIUM 9.2 04/20/2015 0640   AST 18 07/07/2022 0821   AST 29 04/20/2015 0640   ALT 14 07/07/2022 0821   ALT 30 04/20/2015 0640   ALKPHOS 89 07/07/2022 0821   ALKPHOS 80 04/20/2015 0640   BILITOT 0.6 07/07/2022 0821   BILITOT 0.2 (L) 04/20/2015 0640   PROT 6.7 07/07/2022 0821   PROT 6.5 04/20/2015 0640   ALBUMIN 3.8 07/07/2022 0821   ALBUMIN 3.7 04/20/2015 0640     Present during today's visit: patient only, seen down in radiation oncology department  Start plan: Patient knows to get started along with her first day of radiation on 07/27/22   Patient Education I spoke with patient for overview of new oral chemotherapy medication: capecitabine   Administration: Counseled patient on administration, dosing, side effects, monitoring, drug-food interactions, safe handling, storage, and disposal. Patient will take two 579m tablets and two 1531mtablets by mouth 2 (two) times daily after a meal. Take Monday- Friday. Take only on days of radiation.  Side Effects: Side effects include but not limited to: diarrhea, hand-foot syndrome, mouth sores, edema, decreased wbc, fatigue, N/V Diarrhea: patient is familiar with loperamide and will use is as needed. She knows to call and report if she is having  4 or more loose stools per day Hand-foot syndrome: Provided patient with sample bottle  of [place] Smooth Extra Care 20. Instructed her to report an skin changes or pain she notices on her hands or feet. Mouth sores: Patient Magic Mouthwash prescription at her local pharmacy, she know to call if she needs more   Adherence: After discussion with patient no patient barriers to medication adherence identified.  Reviewed with patient importance of keeping a medication schedule and plan for any missed doses.  Ms. Engebretsen voiced understanding and appreciation. All questions answered. Medication handout provided.  Provided patient with [facility] phone number. Patient knows to call the office with questions or concerns. [facility] will continue to follow.  Patient expressed understanding and was in agreement with this plan. She also understands that She can call clinic at any time with any questions, concerns, or complaints.   Medication Access Issues: No issue, patient filled at [facility] and has medication in hand  Follow-up plan: RTC next week for the start of radiation  Thank you for allowing me to participate in the care of this patient.   Time Total: 15 mins  Visit consisted of counseling and education on dealing with issues of symptom management in the setting of serious and potentially life-threatening illness.Greater than 50%  of this time was spent counseling and coordinating care related to the above assessment and plan.  Signed by: Darl Pikes, PharmD, BCPS, Salley Slaughter, CPP Hematology/Oncology Clinical Pharmacist Practitioner [place]/DB/AP [facility] [phone]  07/19/2022 1:42 PM

## 2022-07-20 ENCOUNTER — Telehealth: Payer: Self-pay | Admitting: Family Medicine

## 2022-07-20 NOTE — Telephone Encounter (Signed)
Copied from [place] [phone]. Topic: General - Other >> Jul 20, 2022  4:05 PM [place] B wrote: Reason for CRM: Otila Kluver from [facility] home health called in states, patient declined their services because they dont do the services she is asking for, and that is cleaning and meal prep

## 2022-07-21 DIAGNOSIS — C20 Malignant neoplasm of rectum: Secondary | ICD-10-CM | POA: Diagnosis not present

## 2022-07-24 ENCOUNTER — Ambulatory Visit: Payer: Medicare Other | Admitting: Radiation Oncology

## 2022-07-26 ENCOUNTER — Ambulatory Visit
Admission: RE | Admit: 2022-07-26 | Discharge: 2022-07-26 | Disposition: A | Discharge status: Home / Self Care | Payer: Medicare Other | Source: Ambulatory Visit | Attending: Radiation Oncology | Admitting: Radiation Oncology

## 2022-07-26 DIAGNOSIS — K219 Gastro-esophageal reflux disease without esophagitis: Secondary | ICD-10-CM | POA: Diagnosis not present

## 2022-07-26 DIAGNOSIS — Z808 Family history of malignant neoplasm of other organs or systems: Secondary | ICD-10-CM | POA: Insufficient documentation

## 2022-07-26 DIAGNOSIS — C20 Malignant neoplasm of rectum: Secondary | ICD-10-CM | POA: Insufficient documentation

## 2022-07-26 DIAGNOSIS — I10 Essential (primary) hypertension: Secondary | ICD-10-CM | POA: Insufficient documentation

## 2022-07-26 DIAGNOSIS — G4730 Sleep apnea, unspecified: Secondary | ICD-10-CM | POA: Diagnosis not present

## 2022-07-26 DIAGNOSIS — Z9221 Personal history of antineoplastic chemotherapy: Secondary | ICD-10-CM | POA: Diagnosis not present

## 2022-07-26 DIAGNOSIS — N189 Chronic kidney disease, unspecified: Secondary | ICD-10-CM | POA: Diagnosis not present

## 2022-07-26 DIAGNOSIS — F17210 Nicotine dependence, cigarettes, uncomplicated: Secondary | ICD-10-CM | POA: Insufficient documentation

## 2022-07-26 DIAGNOSIS — F209 Schizophrenia, unspecified: Secondary | ICD-10-CM | POA: Diagnosis not present

## 2022-07-26 DIAGNOSIS — D696 Thrombocytopenia, unspecified: Secondary | ICD-10-CM | POA: Insufficient documentation

## 2022-07-26 DIAGNOSIS — E119 Type 2 diabetes mellitus without complications: Secondary | ICD-10-CM | POA: Insufficient documentation

## 2022-07-26 DIAGNOSIS — Z510 Encounter for antineoplastic radiation therapy: Secondary | ICD-10-CM | POA: Insufficient documentation

## 2022-07-26 DIAGNOSIS — Z800 Family history of malignant neoplasm of digestive organs: Secondary | ICD-10-CM | POA: Diagnosis not present

## 2022-07-26 DIAGNOSIS — K5909 Other constipation: Secondary | ICD-10-CM | POA: Insufficient documentation

## 2022-07-26 DIAGNOSIS — Z79899 Other long term (current) drug therapy: Secondary | ICD-10-CM | POA: Diagnosis not present

## 2022-07-26 DIAGNOSIS — Z803 Family history of malignant neoplasm of breast: Secondary | ICD-10-CM | POA: Diagnosis not present

## 2022-07-26 DIAGNOSIS — J449 Chronic obstructive pulmonary disease, unspecified: Secondary | ICD-10-CM | POA: Diagnosis not present

## 2022-07-26 DIAGNOSIS — Z7984 Long term (current) use of oral hypoglycemic drugs: Secondary | ICD-10-CM | POA: Diagnosis not present

## 2022-07-26 DIAGNOSIS — F319 Bipolar disorder, unspecified: Secondary | ICD-10-CM | POA: Diagnosis not present

## 2022-07-26 DIAGNOSIS — E039 Hypothyroidism, unspecified: Secondary | ICD-10-CM | POA: Diagnosis not present

## 2022-07-27 ENCOUNTER — Inpatient Hospital Stay: Payer: Medicare Other | Attending: Oncology

## 2022-07-27 ENCOUNTER — Ambulatory Visit
Admission: RE | Admit: 2022-07-27 | Discharge: 2022-07-27 | Disposition: A | Discharge status: Home / Self Care | Payer: Medicare Other | Source: Ambulatory Visit | Attending: Radiation Oncology | Admitting: Radiation Oncology

## 2022-07-27 ENCOUNTER — Other Ambulatory Visit: Payer: Self-pay

## 2022-07-27 ENCOUNTER — Encounter: Payer: Self-pay | Admitting: Oncology

## 2022-07-27 ENCOUNTER — Inpatient Hospital Stay (HOSPITAL_BASED_OUTPATIENT_CLINIC_OR_DEPARTMENT_OTHER): Payer: Medicare Other | Admitting: Oncology

## 2022-07-27 ENCOUNTER — Inpatient Hospital Stay: Payer: Medicare Other

## 2022-07-27 VITALS — BP 117/86 | HR 76 | Temp 97.4°F | Resp 18 | Wt 132.3 lb

## 2022-07-27 DIAGNOSIS — D696 Thrombocytopenia, unspecified: Secondary | ICD-10-CM | POA: Diagnosis not present

## 2022-07-27 DIAGNOSIS — Z720 Tobacco use: Secondary | ICD-10-CM

## 2022-07-27 DIAGNOSIS — C20 Malignant neoplasm of rectum: Secondary | ICD-10-CM | POA: Diagnosis not present

## 2022-07-27 DIAGNOSIS — Z5111 Encounter for antineoplastic chemotherapy: Secondary | ICD-10-CM | POA: Diagnosis not present

## 2022-07-27 DIAGNOSIS — Z7189 Other specified counseling: Secondary | ICD-10-CM

## 2022-07-27 LAB — CBC WITH DIFFERENTIAL/PLATELET
Abs Immature Granulocytes: 0.02 10*3/uL (ref 0.00–0.07)
Basophils Absolute: 0.1 10*3/uL (ref 0.0–0.1)
Basophils Relative: 1 %
Eosinophils Absolute: 0.1 10*3/uL (ref 0.0–0.5)
Eosinophils Relative: 2 %
HCT: 46.1 % — ABNORMAL HIGH (ref 36.0–46.0)
Hemoglobin: 15.5 g/dL — ABNORMAL HIGH (ref 12.0–15.0)
Immature Granulocytes: 0 %
Lymphocytes Relative: 33 %
Lymphs Abs: 2.3 10*3/uL (ref 0.7–4.0)
MCH: 31.8 pg (ref 26.0–34.0)
MCHC: 33.6 g/dL (ref 30.0–36.0)
MCV: 94.7 fL (ref 80.0–100.0)
Monocytes Absolute: 0.9 10*3/uL (ref 0.1–1.0)
Monocytes Relative: 13 %
Neutro Abs: 3.4 10*3/uL (ref 1.7–7.7)
Neutrophils Relative %: 51 %
Platelets: 144 10*3/uL — ABNORMAL LOW (ref 150–400)
RBC: 4.87 MIL/uL (ref 3.87–5.11)
RDW: 17.1 % — ABNORMAL HIGH (ref 11.5–15.5)
WBC: 6.8 10*3/uL (ref 4.0–10.5)
nRBC: 0 % (ref 0.0–0.2)

## 2022-07-27 LAB — RAD ONC ARIA SESSION SUMMARY
Course Elapsed Days: 0
Plan Fractions Treated to Date: 1
Plan Prescribed Dose Per Fraction: 1.8 Gy
Plan Total Fractions Prescribed: 25
Plan Total Prescribed Dose: 45 Gy
Reference Point Dosage Given to Date: 1.8 Gy
Reference Point Session Dosage Given: 1.8 Gy
Session Number: 1

## 2022-07-27 LAB — COMPREHENSIVE METABOLIC PANEL
ALT: 24 U/L (ref 0–44)
AST: 24 U/L (ref 15–41)
Albumin: 4.2 g/dL (ref 3.5–5.0)
Alkaline Phosphatase: 109 U/L (ref 38–126)
Anion gap: 8 (ref 5–15)
BUN: 16 mg/dL (ref 6–20)
CO2: 29 mmol/L (ref 22–32)
Calcium: 9.5 mg/dL (ref 8.9–10.3)
Chloride: 101 mmol/L (ref 98–111)
Creatinine, Ser: 1.07 mg/dL — ABNORMAL HIGH (ref 0.44–1.00)
GFR, Estimated: >60 mL/min (ref >60)
Glucose, Bld: 151 mg/dL — ABNORMAL HIGH (ref 70–99)
Potassium: 4.2 mmol/L (ref 3.5–5.1)
Sodium: 138 mmol/L (ref 135–145)
Total Bilirubin: 0.4 mg/dL (ref 0.3–1.2)
Total Protein: 7.7 g/dL (ref 6.5–8.1)

## 2022-07-27 NOTE — Assessment & Plan Note (Signed)
Smoke cessation recommended 

## 2022-07-27 NOTE — Assessment & Plan Note (Signed)
Stable counts. monitor 

## 2022-07-27 NOTE — Progress Notes (Signed)
Nutrition Follow-up:  Patient with stage II adenocarcinoma of rectum.  S/p colostomy 04/21/22.  Patient completed 4 cycles of folfox.  Planning to start radiation and xeloda.    Met with patient following visit with MD.  Patient reports that she is trying to eat often during the day.  Says that she is drinking glucerna shakes.  Does not have a car and husband walks to [place] to get food.  Says that she has access to food when she needs it and food in the house.  No diarrhea and mouth sores are better.     Medications: reviewed  Labs: reviewed  Anthropometrics:   Weight 132 lb 4.8 oz today, increased  130 lb 1.6 oz on 7/24 134 lb 6.4 oz on 7/14 140 lb 6.4 oz on 6/16 142 lb on 6/2   NUTRITION DIAGNOSIS: Inadequate oral intake stable   INTERVENTION:  Continue glucerna shakes Continue small frequent meals    MONITORING, EVALUATION, GOAL: weight trends, intake   NEXT VISIT: to be determined  Meredith Mells B. Zenia Resides, [place], [place] Registered Dietitian [phone]

## 2022-07-27 NOTE — Assessment & Plan Note (Signed)
Chemotherapy plan as listed above 

## 2022-07-27 NOTE — Assessment & Plan Note (Signed)
Stage III. pT3 pN1b cM0, s/p APR. Currently on adjuvant chemotherapy. S/p FOLFOX x 4 cycles Labs reviewed and discussed with patient Start Xeloda.

## 2022-07-27 NOTE — Progress Notes (Signed)
Hematology/Oncology Progress note [phone] [phone]            Patient Care Team: Steele Sizer, MD as PCP - General (Family Medicine) Sharmaine Base, MD as Referring Physician (Psychiatry) Bjorn Loser, MD as Consulting Physician (Urology) Germaine Pomfret, [facility] (Pharmacist) Clent Jacks, RN as Oncology Nurse Navigator   ASSESSMENT & PLAN:   Cancer Staging  Rectal cancer [facility]) Staging form: Colon and Rectum, AJCC 8th Edition - Pathologic stage from 05/03/2022: Stage IIIB (pT3, pN1b, cM0) - Signed by [name], MD on 05/03/2022   Rectal cancer ([place]) Stage III. pT3 pN1b cM0, s/p APR. Currently on adjuvant chemotherapy. S/p FOLFOX x 4 cycles Labs reviewed and discussed with patient Start Xeloda.   Encounter for antineoplastic chemotherapy Chemotherapy plan as listed above  Tobacco use Smoke cessation recommended.    Thrombocytopenia (HCC) Stable counts.  monitor  Orders Placed This Encounter  Procedures   CBC with Differential/Platelet    Standing Status:   Future    Standing Expiration Date:   07/28/2023   Comprehensive metabolic panel    Standing Status:   Future    Standing Expiration Date:   07/27/2023    Port Lab MD 1 week.+/- IVF  All questions were answered. The patient knows to call the clinic with any problems, questions or concerns.  [name], MD, PhD [facility] Hematology Oncology 07/27/2022      CHIEF COMPLAINTS/REASON FOR VISIT:  Follow-up for rectal cancer treatments.  HISTORY OF PRESENTING ILLNESS:   April Luna is a  51 y.o.  female presents for treatment of rectal cancer Oncology History  Rectal cancer ([place])  02/26/2022 Imaging   MRI PELVIS WITHOUT CONTRAST- By imaging, rectal cancer stage:  T1/T2, N0, Mx    03/02/2022 Imaging   CT CHEST AND ABDOMEN WITH CONTRAST 1. No convincing evidence of metastatic disease within the chest or abdomen. 2. Atrophic left kidney with multifocal renal  scarring and cortical calcifications as well as nonobstructive renal stones measuring up to 5 mm. 3. Prominent left-sided predominant retroperitoneal lymph nodes measuring up to 8 mm near the level of the renal hilum, overall decreased in size dating back to CT September 19, 2018 and favored reactive related to left renal inflammation. 4.  Aortic Atherosclerosis (ICD10-I70.0).   03/27/2022 Genetic Testing    [facility] Custom[employer]+RNA cancer panel found no pathogenic mutations.    04/21/2022 Initial Diagnosis   Rectal cancer - baseline CEA 3.6 -02/09/2022, patient had colonoscopy which showed renal mass 10 cm from anal verge.  5 mm polyp in ascending colon.  Removed and retrieved. Pathology showed rectal adenocarcinoma.  The polyp in the ascending colon is a tubular adenoma. -04/21/2022, patient underwent robotic assisted ultralow anterior resection. Pathology showed moderately differentiated adenocarcinoma, 4.5 cm in maximal extent, with focal extension through muscularis propria into perirectal soft tissue.  3 lymph nodes positive for metastatic carcinoma.  Negative margin.  pT3 pN1b, MSI stable.   05/03/2022 Cancer Staging   Staging form: Colon and Rectum, AJCC 8th Edition - Pathologic stage from 05/03/2022: Stage IIIB (pT3, pN1b, cM0) - Signed by [name], MD on 05/03/2022 Stage prefix: Initial diagnosis   05/11/2022 Miscellaneous   Medi port placed by Dr.White   05/26/2022 -  Chemotherapy   FOLFOX Q2 weeks x 4   07/27/2022 -  Chemotherapy   Concurrent chemotherapy- Xeloda  $Remov'1300mg'tnQxhu$  BID and radiation.    Patient has bipolar and schizophrenia..  Patient is married and lives at home with her  husband and son. She start radiation today.  No new complaints.    Review of Systems  Constitutional:  Negative for appetite change, chills, fatigue and fever.  HENT:   Negative for hearing loss and voice change.   Eyes:  Negative for eye problems.  Respiratory:  Negative for chest tightness and cough.    Cardiovascular:  Negative for chest pain.  Gastrointestinal:  Negative for abdominal distention, abdominal pain and blood in stool.  Endocrine: Negative for hot flashes.  Genitourinary:  Negative for difficulty urinating and frequency.   Musculoskeletal:  Negative for arthralgias.  Skin:  Negative for itching and rash.  Neurological:  Negative for extremity weakness.  Hematological:  Negative for adenopathy.  Psychiatric/Behavioral:  Negative for confusion.     MEDICAL HISTORY:  Past Medical History:  Diagnosis Date   Allergy    pollen   Anxiety    Arthritis    right hip   Bipolar 1 disorder (HCC)    Cancer (HCC)    rectal   Chronic kidney disease    COPD (chronic obstructive pulmonary disease) (HCC)    Depression    Family history of adverse reaction to anesthesia    grand father had a stroke during anesthesia   Family history of breast cancer    Family history of colon cancer    Family history of uterine cancer    GERD (gastroesophageal reflux disease)    History of kidney stones    Hyperlipidemia    Hypertension    Hypothyroidism    Panic attack    Pneumonia    Psoriasis    Sleep apnea 08/11/2021   No CPAP   Type 2 diabetes mellitus with microalbuminuria, without long-term current use of insulin (HCC) 06/24/2019    SURGICAL HISTORY: Past Surgical History:  Procedure Laterality Date   BREAST BIOPSY Left 12/14/2021   [place] bx, venus marker, path pending   CESAREAN SECTION     COLONOSCOPY WITH PROPOFOL N/A 02/09/2022   Procedure: COLONOSCOPY WITH PROPOFOL;  Surgeon: Toney Reil, MD;  Location: [facility];  Service: Endoscopy;  Laterality: N/A;  sleep apnea   CYSTOSCOPY W/ RETROGRADES Left 11/08/2018   Procedure: CYSTOSCOPY WITH RETROGRADE PYELOGRAM;  Surgeon: Sondra Come, MD;  Location: [facility];  Service: Urology;  Laterality: Left;   CYSTOSCOPY/URETEROSCOPY/HOLMIUM LASER/STENT PLACEMENT Left 11/08/2018   Procedure:  CYSTOSCOPY/URETEROSCOPY/HOLMIUM LASER/STENT PLACEMENT;  Surgeon: Sondra Come, MD;  Location: [facility];  Service: Urology;  Laterality: Left;   IR IMAGING GUIDED PORT INSERTION  05/11/2022   MOUTH SURGERY     wisdom teeth extraction   MOUTH SURGERY     teeth removal   POLYPECTOMY N/A 02/09/2022   Procedure: POLYPECTOMY;  Surgeon: Toney Reil, MD;  Location: [facility];  Service: Endoscopy;  Laterality: N/A;   XI ROBOTIC ASSISTED LOWER ANTERIOR RESECTION N/A 04/21/2022   Procedure: XI ROBOTIC ASSISTED LOWER ANTERIOR RESECTION WITH COLOSTOMY, BILATERAL TAP BLOCK, ASSESSMENT OF TISSUE PERFUSSION WITH FIREFLY INJECTION;  Surgeon: Andria Meuse, MD;  Location: [facility];  Service: General;  Laterality: N/A;    SOCIAL HISTORY: Social History   Socioeconomic History   Marital status: Married    Spouse name: Lesle Chris   Number of children: 1   Years of education: 12   Highest education level: High school graduate  Occupational History   Occupation: unemployed    Comment: disabled  Tobacco Use   Smoking status: Every Day    Packs/day: 0.25  Years: 34.00    Total pack years: 8.50    Types: Cigarettes    Start date: 05/13/1985   Smokeless tobacco: Never   Tobacco comments:    1 PPD 04/20/223  Vaping Use   Vaping Use: Former   Start date: 05/25/2018   Quit date: 09/24/2018  Substance and Sexual Activity   Alcohol use: Not Currently    Alcohol/week: 4.0 standard drinks of alcohol    Types: 4 Glasses of wine per week    Comment: quit ETOH in Feb. 2023   Drug use: Not Currently    Types: Marijuana    Comment: 2 days ago   Sexual activity: Not Currently    Birth control/protection: Post-menopausal  Other Topics Concern   Not on file  Social History Narrative   Lives with husband and son    Social Determinants of Health   Financial Resource Strain: Medium Risk (12/02/2021)   Overall Financial Resource Strain (CARDIA)    Difficulty of Paying Living  Expenses: Somewhat hard  Food Insecurity: No Food Insecurity (12/02/2021)   Hunger Vital Sign    Worried About Running Out of Food in the Last Year: Never true    Ran Out of Food in the Last Year: Never true  Transportation Needs: Unmet Transportation Needs (12/02/2021)   PRAPARE - Transportation    Lack of Transportation (Medical): Yes    Lack of Transportation (Non-Medical): Yes  Physical Activity: Inactive (12/02/2021)   Exercise Vital Sign    Days of Exercise per Week: 0 days    Minutes of Exercise per Session: 0 min  Stress: No Stress Concern Present (12/02/2021)   [place] of Stress : Not at all  Social Connections: Moderately Integrated (12/02/2021)   Social Connection and Isolation Panel [NHANES]    Frequency of Communication with Friends and Family: More than three times a week    Frequency of Social Gatherings with Friends and Family: Twice a week    Attends Religious Services: More than 4 times per year    Active Member of [employer] or Organizations: No    Attends Archivist Meetings: Never    Marital Status: Married  Human resources officer Violence: Not At Risk (07/07/2021)   Humiliation, Afraid, Rape, and Kick questionnaire    Fear of Current or Ex-Partner: No    Emotionally Abused: No    Physically Abused: No    Sexually Abused: No    FAMILY HISTORY: Family History  Problem Relation Age of Onset   Depression Mother    Anxiety disorder Mother    Diabetes Mother    Hypertension Mother    Hyperlipidemia Mother    Cancer Mother    Uterine cancer Mother 16   Cervical cancer Mother 44   Colon cancer Father    Depression Brother    Anxiety disorder Brother    Cancer Maternal Aunt        unk types   Diabetes Mellitus II Maternal Grandmother    Hypercholesterolemia Maternal Grandmother    Breast cancer Maternal Grandmother    Cancer Paternal Grandmother    Diabetes Paternal Grandmother     Melanoma Paternal Grandmother    Stomach cancer Paternal Grandmother     ALLERGIES:  is allergic to metformin and related, nsaids, perphenazine, sulfa antibiotics, abilify [aripiprazole], and penicillins.  MEDICATIONS:  Current Outpatient Medications  Medication Sig Dispense Refill   acetaminophen (TYLENOL) 325 MG tablet Take 650 mg by  mouth every 6 (six) hours as needed for headache (pain).     albuterol (VENTOLIN HFA) 108 (90 Base) MCG/ACT inhaler Inhale 2 puffs into the lungs every 6 (six) hours as needed for wheezing or shortness of breath. 1 each 5   amantadine (SYMMETREL) 100 MG capsule Take 100 mg by mouth 2 (two) times daily.     blood glucose meter kit and supplies Dispense based on patient and insurance preference. Use up to four times daily as directed. (FOR ICD-10 E10.9, E11.9). 1 each 0   Budeson-Glycopyrrol-Formoterol (BREZTRI AEROSPHERE) 160-9-4.8 MCG/ACT AERO Inhale 2 puffs into the lungs in the morning and at bedtime. 5.9 g 11   buPROPion (WELLBUTRIN XL) 150 MG 24 hr tablet Take 150 mg by mouth every morning.     capecitabine (XELODA) 150 MG tablet Take 300 mg by mouth 2 (two) times daily after a meal. Take along with $RemoveBe'500mg'SYDXGeLNP$  tablets. Take Monday- Friday. Take only on days of radiation.     capecitabine (XELODA) 500 MG tablet Take 2 tablets (1,000 mg total) by mouth 2 (two) times daily after a meal. Take along with $RemoveBe'150mg'NLXDFYote$  tablets. Take Monday- Friday. Take only on days of radiation. 112 tablet 0   Cholecalciferol (VITAMIN D-3) 125 MCG (5000 UT) TABS Take 5,000 Units by mouth daily. 30 tablet 1   FARXIGA 10 MG TABS tablet TAKE 1 TABLET BY MOUTH DAILY BEFORE BREAKFAST. 30 tablet 2   gabapentin (NEURONTIN) 300 MG capsule Take 300 mg by mouth 2 (two) times daily.     hydrocortisone cream 0.5 % Apply 1 Application topically 2 (two) times daily as needed for itching. 30 g 1   Hydrocortisone, Perianal, 1 % CREA Apply topically.     hydrOXYzine (VISTARIL) 50 MG capsule Take 50 mg by  mouth 3 (three) times daily as needed for anxiety.     icosapent Ethyl (VASCEPA) 1 g capsule TAKE 2 CAPSULES BY MOUTH 2 TIMES DAILY 120 capsule 3   levothyroxine (SYNTHROID) 75 MCG tablet TAKE 1 TABLET BY MOUTH DAILY BEFORE BREAKFAST 90 tablet 0   lidocaine-prilocaine (EMLA) cream Apply to affected area once 30 g 3   loratadine (CLARITIN) 10 MG tablet Take 10 mg by mouth every morning.     losartan (COZAAR) 25 MG tablet Take 1 tablet (25 mg total) by mouth at bedtime. 30 tablet 3   NON FORMULARY Pt uses a cpap nightly     nystatin (MYCOSTATIN) 100000 UNIT/ML suspension Take 5 mLs (500,000 Units total) by mouth 4 (four) times daily. Swish and spit 60 mL 0   ondansetron (ZOFRAN) 8 MG tablet Take 1 tablet (8 mg total) by mouth 2 (two) times daily as needed for refractory nausea / vomiting. Start on day 3 after chemotherapy. 30 tablet 1   paliperidone (INVEGA SUSTENNA) 234 MG/1.5ML SUSY injection Inject 234 mg into the muscle every 30 (thirty) days. On or about the 14th of each month     pantoprazole (PROTONIX) 40 MG tablet TAKE 1 TABLET BY MOUTH DAILY 90 tablet 0   Pediatric Multivit-Minerals-C (CHEWABLES MULTIVITAMIN PO) Take 1 tablet by mouth daily.     pioglitazone (ACTOS) 15 MG tablet Take 1 tablet (15 mg total) by mouth daily. 30 tablet 3   prochlorperazine (COMPAZINE) 10 MG tablet Take 1 tablet (10 mg total) by mouth every 6 (six) hours as needed for nausea or vomiting. 60 tablet 3   rosuvastatin (CRESTOR) 40 MG tablet Take 1 tablet (40 mg total) by mouth every morning. 30 tablet  3   sertraline (ZOLOFT) 100 MG tablet Take 200 mg by mouth every morning.     Spacer/Aero-Holding Chambers (AEROCHAMBER MV) inhaler Use as instructed 1 each 0   No current facility-administered medications for this visit.     PHYSICAL EXAMINATION: ECOG PERFORMANCE STATUS: 0 - Asymptomatic Vitals:   07/27/22 1355  BP: 117/86  Pulse: 76  Resp: 18  Temp: (!) 97.4 F (36.3 C)    Filed Weights   07/27/22 1355   Weight: 132 lb 4.8 oz (60 kg)     Physical Exam Constitutional:      General: She is not in acute distress. HENT:     Head: Normocephalic and atraumatic.  Eyes:     General: No scleral icterus. Cardiovascular:     Rate and Rhythm: Normal rate and regular rhythm.     Heart sounds: Normal heart sounds.  Pulmonary:     Effort: Pulmonary effort is normal. No respiratory distress.     Breath sounds: Wheezing present.  Abdominal:     General: Bowel sounds are normal. There is no distension.     Palpations: Abdomen is soft.  Musculoskeletal:        General: No deformity. Normal range of motion.     Cervical back: Normal range of motion and neck supple.  Skin:    General: Skin is warm and dry.     Findings: No erythema or rash.  Neurological:     Mental Status: She is alert and oriented to person, place, and time. Mental status is at baseline.     Cranial Nerves: No cranial nerve deficit.     Coordination: Coordination normal.  Psychiatric:        Mood and Affect: Mood normal.     LABORATORY DATA:  I have reviewed the data as listed    Latest Ref Rng & Units 07/27/2022    1:41 PM 07/10/2022    1:35 PM 07/07/2022    8:21 AM  CBC  WBC 4.0 - 10.5 K/uL 6.8  5.1  6.2   Hemoglobin 12.0 - 15.0 g/dL 15.5  15.5  14.6   Hematocrit 36.0 - 46.0 % 46.1  46.3  42.8   Platelets 150 - 400 K/uL 144  115  87       Latest Ref Rng & Units 07/27/2022    1:41 PM 07/14/2022   10:50 AM 07/07/2022    8:21 AM  CMP  Glucose 70 - 99 mg/dL 151  234  196   BUN 6 - 20 mg/dL $Remove'16  16  15   'YRQJSVm$ Creatinine 0.44 - 1.00 mg/dL 1.07  1.30  1.08   Sodium 135 - 145 mmol/L 138  138  139   Potassium 3.5 - 5.1 mmol/L 4.2  4.1  4.0   Chloride 98 - 111 mmol/L 101  103  102   CO2 22 - 32 mmol/L $RemoveB'29  28  27   'LgPRMAyh$ Calcium 8.9 - 10.3 mg/dL 9.5  9.1  9.0   Total Protein 6.5 - 8.1 g/dL 7.7   6.7   Total Bilirubin 0.3 - 1.2 mg/dL 0.4   0.6   Alkaline Phos 38 - 126 U/L 109   89   AST 15 - 41 U/L 24   18   ALT 0 - 44 U/L 24   14       Iron/TIBC/Ferritin/ %Sat No results found for: "IRON", "TIBC", "FERRITIN", "IRONPCTSAT"    RADIOGRAPHIC STUDIES: I have personally reviewed the radiological images as listed  and agreed with the findings in the report. No results found.

## 2022-07-27 NOTE — Progress Notes (Signed)
Patient here for follow up. No new concerns voiced.  °

## 2022-07-28 ENCOUNTER — Ambulatory Visit: Payer: Medicare Other

## 2022-07-31 ENCOUNTER — Other Ambulatory Visit: Payer: Self-pay

## 2022-07-31 ENCOUNTER — Ambulatory Visit
Admission: RE | Admit: 2022-07-31 | Discharge: 2022-07-31 | Disposition: A | Discharge status: Home / Self Care | Payer: Medicare Other | Source: Ambulatory Visit | Attending: Radiation Oncology | Admitting: Radiation Oncology

## 2022-07-31 DIAGNOSIS — C20 Malignant neoplasm of rectum: Secondary | ICD-10-CM | POA: Diagnosis not present

## 2022-07-31 LAB — RAD ONC ARIA SESSION SUMMARY
Course Elapsed Days: 4
Plan Fractions Treated to Date: 2
Plan Prescribed Dose Per Fraction: 1.8 Gy
Plan Total Fractions Prescribed: 25
Plan Total Prescribed Dose: 45 Gy
Reference Point Dosage Given to Date: 3.6 Gy
Reference Point Session Dosage Given: 1.8 Gy
Session Number: 2

## 2022-08-01 ENCOUNTER — Other Ambulatory Visit (HOSPITAL_COMMUNITY): Payer: Self-pay

## 2022-08-01 ENCOUNTER — Inpatient Hospital Stay: Payer: Medicare Other

## 2022-08-01 ENCOUNTER — Inpatient Hospital Stay (HOSPITAL_BASED_OUTPATIENT_CLINIC_OR_DEPARTMENT_OTHER): Payer: Medicare Other | Admitting: Oncology

## 2022-08-01 ENCOUNTER — Encounter: Payer: Self-pay | Admitting: Oncology

## 2022-08-01 ENCOUNTER — Ambulatory Visit
Admission: RE | Admit: 2022-08-01 | Discharge: 2022-08-01 | Disposition: A | Discharge status: Home / Self Care | Payer: Medicare Other | Source: Ambulatory Visit | Attending: Radiation Oncology | Admitting: Radiation Oncology

## 2022-08-01 ENCOUNTER — Other Ambulatory Visit: Payer: Self-pay

## 2022-08-01 VITALS — BP 105/81 | HR 98 | Temp 96.6°F | Resp 18 | Wt 132.6 lb

## 2022-08-01 DIAGNOSIS — C20 Malignant neoplasm of rectum: Secondary | ICD-10-CM | POA: Diagnosis not present

## 2022-08-01 DIAGNOSIS — Z5111 Encounter for antineoplastic chemotherapy: Secondary | ICD-10-CM

## 2022-08-01 DIAGNOSIS — N1831 Chronic kidney disease, stage 3a: Secondary | ICD-10-CM | POA: Diagnosis not present

## 2022-08-01 DIAGNOSIS — D696 Thrombocytopenia, unspecified: Secondary | ICD-10-CM

## 2022-08-01 DIAGNOSIS — N189 Chronic kidney disease, unspecified: Secondary | ICD-10-CM | POA: Insufficient documentation

## 2022-08-01 DIAGNOSIS — K5909 Other constipation: Secondary | ICD-10-CM

## 2022-08-01 LAB — CBC WITH DIFFERENTIAL/PLATELET
Abs Immature Granulocytes: 0.02 10*3/uL (ref 0.00–0.07)
Basophils Absolute: 0.1 10*3/uL (ref 0.0–0.1)
Basophils Relative: 1 %
Eosinophils Absolute: 0.1 10*3/uL (ref 0.0–0.5)
Eosinophils Relative: 2 %
HCT: 45.6 % (ref 36.0–46.0)
Hemoglobin: 15.5 g/dL — ABNORMAL HIGH (ref 12.0–15.0)
Immature Granulocytes: 0 %
Lymphocytes Relative: 21 %
Lymphs Abs: 1.7 10*3/uL (ref 0.7–4.0)
MCH: 32.4 pg (ref 26.0–34.0)
MCHC: 34 g/dL (ref 30.0–36.0)
MCV: 95.2 fL (ref 80.0–100.0)
Monocytes Absolute: 0.6 10*3/uL (ref 0.1–1.0)
Monocytes Relative: 7 %
Neutro Abs: 5.6 10*3/uL (ref 1.7–7.7)
Neutrophils Relative %: 69 %
Platelets: 144 10*3/uL — ABNORMAL LOW (ref 150–400)
RBC: 4.79 MIL/uL (ref 3.87–5.11)
RDW: 16.9 % — ABNORMAL HIGH (ref 11.5–15.5)
WBC: 8.1 10*3/uL (ref 4.0–10.5)
nRBC: 0 % (ref 0.0–0.2)

## 2022-08-01 LAB — RAD ONC ARIA SESSION SUMMARY
Course Elapsed Days: 5
Plan Fractions Treated to Date: 3
Plan Prescribed Dose Per Fraction: 1.8 Gy
Plan Total Fractions Prescribed: 25
Plan Total Prescribed Dose: 45 Gy
Reference Point Dosage Given to Date: 5.4 Gy
Reference Point Session Dosage Given: 1.8 Gy
Session Number: 3

## 2022-08-01 LAB — COMPREHENSIVE METABOLIC PANEL
ALT: 16 U/L (ref 0–44)
AST: 28 U/L (ref 15–41)
Albumin: 4.1 g/dL (ref 3.5–5.0)
Alkaline Phosphatase: 98 U/L (ref 38–126)
Anion gap: 13 (ref 5–15)
BUN: 15 mg/dL (ref 6–20)
CO2: 27 mmol/L (ref 22–32)
Calcium: 9.2 mg/dL (ref 8.9–10.3)
Chloride: 97 mmol/L — ABNORMAL LOW (ref 98–111)
Creatinine, Ser: 1.41 mg/dL — ABNORMAL HIGH (ref 0.44–1.00)
GFR, Estimated: 46 mL/min — ABNORMAL LOW (ref >60)
Glucose, Bld: 258 mg/dL — ABNORMAL HIGH (ref 70–99)
Potassium: 3.5 mmol/L (ref 3.5–5.1)
Sodium: 137 mmol/L (ref 135–145)
Total Bilirubin: 0.4 mg/dL (ref 0.3–1.2)
Total Protein: 7.3 g/dL (ref 6.5–8.1)

## 2022-08-01 MED ORDER — SODIUM CHLORIDE 0.9 % IV SOLN
Freq: Once | INTRAVENOUS | Status: AC
  Filled 2022-08-01: qty 250

## 2022-08-01 MED ORDER — HEPARIN SOD (PORK) LOCK FLUSH 100 UNIT/ML IV SOLN
500.0000 [IU] | Freq: Once | INTRAVENOUS | Status: AC | PRN
  Administered 2022-08-01: 500 [IU]
  Filled 2022-08-01: qty 5

## 2022-08-01 MED ORDER — DOCUSATE SODIUM 100 MG PO CAPS: 100.0000 mg | ORAL_CAPSULE | Freq: Two times a day (BID) | ORAL | 1 refills | Status: DC

## 2022-08-01 MED ORDER — SODIUM CHLORIDE 0.9% FLUSH
10.0000 mL | Freq: Once | INTRAVENOUS | Status: DC | PRN
  Filled 2022-08-01: qty 10

## 2022-08-01 NOTE — Assessment & Plan Note (Signed)
Creatinine increased.  Encourage oral hydration IV fluid normal saline 1 L x 1 today. Patient will return in 2-3 days with repeat BMP and IV fluid

## 2022-08-01 NOTE — Assessment & Plan Note (Signed)
Stage III. pT3 pN1b cM0, s/p APR. Currently on adjuvant chemotherapy. S/p FOLFOX x 4 cycles Labs reviewed and discussed with patient Continue Xeloda 1300 mg twice daily.  Overall she tolerates well.

## 2022-08-01 NOTE — Progress Notes (Signed)
Hematology/Oncology Progress note [phone] [phone]            Patient Care Team: Steele Sizer, MD as PCP - General (Family Medicine) Sharmaine Base, MD as Referring Physician (Psychiatry) Bjorn Loser, MD as Consulting Physician (Urology) Germaine Pomfret, [facility] (Pharmacist) Clent Jacks, RN as Oncology Nurse Navigator   ASSESSMENT & PLAN:   Cancer Staging  Rectal cancer [facility]) Staging form: Colon and Rectum, AJCC 8th Edition - Pathologic stage from 05/03/2022: Stage IIIB (pT3, pN1b, cM0) - Signed by [name], MD on 05/03/2022   Rectal cancer ([place]) Stage III. pT3 pN1b cM0, s/p APR. Currently on adjuvant chemotherapy. S/p FOLFOX x 4 cycles Labs reviewed and discussed with patient Continue Xeloda 1300 mg twice daily.  Overall she tolerates well.   Encounter for antineoplastic chemotherapy Chemotherapy plan as listed above  Thrombocytopenia (HCC) Stable counts.  monitor  CKD (chronic kidney disease) Creatinine increased.  Encourage oral hydration IV fluid normal saline 1 L x 1 today. Patient will return in 2-3 days with repeat BMP and IV fluid  Chronic constipation Recommend Colace 100 mg twice daily  Orders Placed This Encounter  Procedures   Basic metabolic panel    Standing Status:   Future    Standing Expiration Date:   08/01/2023    Port Lab MD 1 week.+/- IVF  All questions were answered. The patient knows to call the clinic with any problems, questions or concerns.  [name], MD, PhD [facility] Hematology Oncology 08/01/2022      CHIEF COMPLAINTS/REASON FOR VISIT:  Follow-up for rectal cancer treatments.  HISTORY OF PRESENTING ILLNESS:   April Luna is a  51 y.o.  female presents for treatment of rectal cancer Oncology History  Rectal cancer ([place])  02/26/2022 Imaging   MRI PELVIS WITHOUT CONTRAST- By imaging, rectal cancer stage:  T1/T2, N0, Mx    03/02/2022 Imaging   CT CHEST AND ABDOMEN WITH  CONTRAST 1. No convincing evidence of metastatic disease within the chest or abdomen. 2. Atrophic left kidney with multifocal renal scarring and cortical calcifications as well as nonobstructive renal stones measuring up to 5 mm. 3. Prominent left-sided predominant retroperitoneal lymph nodes measuring up to 8 mm near the level of the renal hilum, overall decreased in size dating back to CT September 19, 2018 and favored reactive related to left renal inflammation. 4.  Aortic Atherosclerosis (ICD10-I70.0).   03/27/2022 Genetic Testing    [facility] Custom[employer]+RNA cancer panel found no pathogenic mutations.    04/21/2022 Initial Diagnosis   Rectal cancer - baseline CEA 3.6 -02/09/2022, patient had colonoscopy which showed renal mass 10 cm from anal verge.  5 mm polyp in ascending colon.  Removed and retrieved. Pathology showed rectal adenocarcinoma.  The polyp in the ascending colon is a tubular adenoma. -04/21/2022, patient underwent robotic assisted ultralow anterior resection. Pathology showed moderately differentiated adenocarcinoma, 4.5 cm in maximal extent, with focal extension through muscularis propria into perirectal soft tissue.  3 lymph nodes positive for metastatic carcinoma.  Negative margin.  pT3 pN1b, MSI stable.   05/03/2022 Cancer Staging   Staging form: Colon and Rectum, AJCC 8th Edition - Pathologic stage from 05/03/2022: Stage IIIB (pT3, pN1b, cM0) - Signed by [name], MD on 05/03/2022 Stage prefix: Initial diagnosis   05/11/2022 Miscellaneous   Medi port placed by Dr.White   05/26/2022 -  Chemotherapy   FOLFOX Q2 weeks x 4   07/27/2022 -  Chemotherapy   Concurrent chemotherapy- Xeloda  1368m BID  and radiation.    Patient has bipolar and schizophrenia..  Patient is married and lives at home with her husband and son.  Patient tolerates Xarelto chemotherapy and radiation + Constipation, he has not had good bowel movement output in the colostomy bag for 2 days.  No abdominal  pain.   Review of Systems  Constitutional:  Negative for appetite change, chills, fatigue and fever.  HENT:   Negative for hearing loss and voice change.   Eyes:  Negative for eye problems.  Respiratory:  Negative for chest tightness and cough.   Cardiovascular:  Negative for chest pain.  Gastrointestinal:  Negative for abdominal distention, abdominal pain and blood in stool.  Endocrine: Negative for hot flashes.  Genitourinary:  Negative for difficulty urinating and frequency.   Musculoskeletal:  Negative for arthralgias.  Skin:  Negative for itching and rash.  Neurological:  Negative for extremity weakness.  Hematological:  Negative for adenopathy.  Psychiatric/Behavioral:  Negative for confusion.     MEDICAL HISTORY:  Past Medical History:  Diagnosis Date   Allergy    pollen   Anxiety    Arthritis    right hip   Bipolar 1 disorder (HCC)    Cancer (HCC)    rectal   Chronic kidney disease    COPD (chronic obstructive pulmonary disease) (HCC)    Depression    Family history of adverse reaction to anesthesia    grand father had a stroke during anesthesia   Family history of breast cancer    Family history of colon cancer    Family history of uterine cancer    GERD (gastroesophageal reflux disease)    History of kidney stones    Hyperlipidemia    Hypertension    Hypothyroidism    Panic attack    Pneumonia    Psoriasis    Sleep apnea 08/11/2021   No CPAP   Type 2 diabetes mellitus with microalbuminuria, without long-term current use of insulin ([place]) 06/24/2019    SURGICAL HISTORY: Past Surgical History:  Procedure Laterality Date   BREAST BIOPSY Left 12/14/2021   [place] bx, venus marker, path pending   CESAREAN SECTION     COLONOSCOPY WITH PROPOFOL N/A 02/09/2022   Procedure: COLONOSCOPY WITH PROPOFOL;  Surgeon: Lin Landsman, MD;  Location: [place];  Service: Endoscopy;  Laterality: N/A;  sleep apnea   CYSTOSCOPY W/ RETROGRADES Left 11/08/2018    Procedure: CYSTOSCOPY WITH RETROGRADE PYELOGRAM;  Surgeon: Billey Co, MD;  Location: [facility];  Service: Urology;  Laterality: Left;   CYSTOSCOPY/URETEROSCOPY/HOLMIUM LASER/STENT PLACEMENT Left 11/08/2018   Procedure: CYSTOSCOPY/URETEROSCOPY/HOLMIUM LASER/STENT PLACEMENT;  Surgeon: Billey Co, MD;  Location: [facility];  Service: Urology;  Laterality: Left;   IR IMAGING GUIDED PORT INSERTION  05/11/2022   MOUTH SURGERY     wisdom teeth extraction   MOUTH SURGERY     teeth removal   POLYPECTOMY N/A 02/09/2022   Procedure: POLYPECTOMY;  Surgeon: Lin Landsman, MD;  Location: [place];  Service: Endoscopy;  Laterality: N/A;   XI ROBOTIC ASSISTED LOWER ANTERIOR RESECTION N/A 04/21/2022   Procedure: XI ROBOTIC ASSISTED LOWER ANTERIOR RESECTION WITH COLOSTOMY, BILATERAL TAP BLOCK, ASSESSMENT OF TISSUE PERFUSSION WITH FIREFLY INJECTION;  Surgeon: Ileana Roup, MD;  Location: [facility];  Service: General;  Laterality: N/A;    SOCIAL HISTORY: Social History   Socioeconomic History   Marital status: Married    Spouse name: Montine Circle   Number of children: 1   Years  of education: 12   Highest education level: High school graduate  Occupational History   Occupation: unemployed    Comment: disabled  Tobacco Use   Smoking status: Every Day    Packs/day: 0.25    Years: 34.00    Total pack years: 8.50    Types: Cigarettes    Start date: 05/13/1985   Smokeless tobacco: Never   Tobacco comments:    1 PPD 04/20/223  Vaping Use   Vaping Use: Former   Start date: 05/25/2018   Quit date: 09/24/2018  Substance and Sexual Activity   Alcohol use: Not Currently    Alcohol/week: 4.0 standard drinks of alcohol    Types: 4 Glasses of wine per week    Comment: quit ETOH in Feb. 2023   Drug use: Not Currently    Types: Marijuana    Comment: 2 days ago   Sexual activity: Not Currently    Birth control/protection: Post-menopausal  Other Topics Concern   Not on file   Social History Narrative   Lives with husband and son    Social Determinants of Health   Financial Resource Strain: Medium Risk (12/02/2021)   Overall Financial Resource Strain (CARDIA)    Difficulty of Paying Living Expenses: Somewhat hard  Food Insecurity: No Food Insecurity (12/02/2021)   Hunger Vital Sign    Worried About Running Out of Food in the Last Year: Never true    [place] in the Last Year: Never true  Transportation Needs: Unmet Transportation Needs (12/02/2021)   PRAPARE - Transportation    Lack of Transportation (Medical): Yes    Lack of Transportation (Non-Medical): Yes  Physical Activity: Inactive (12/02/2021)   Exercise Vital Sign    Days of Exercise per Week: 0 days    Minutes of Exercise per Session: 0 min  Stress: No Stress Concern Present (12/02/2021)   [place] of Stress : Not at all  Social Connections: Moderately Integrated (12/02/2021)   Social Connection and Isolation Panel [NHANES]    Frequency of Communication with Friends and Family: More than three times a week    Frequency of Social Gatherings with Friends and Family: Twice a week    Attends Religious Services: More than 4 times per year    Active Member of [employer] or Organizations: No    Attends Archivist Meetings: Never    Marital Status: Married  Human resources officer Violence: Not At Risk (07/07/2021)   Humiliation, Afraid, Rape, and Kick questionnaire    Fear of Current or Ex-Partner: No    Emotionally Abused: No    Physically Abused: No    Sexually Abused: No    FAMILY HISTORY: Family History  Problem Relation Age of Onset   Depression Mother    Anxiety disorder Mother    Diabetes Mother    Hypertension Mother    Hyperlipidemia Mother    Cancer Mother    Uterine cancer Mother 39   Cervical cancer Mother 43   Colon cancer Father    Depression Brother    Anxiety disorder Brother    Cancer  Maternal Aunt        unk types   Diabetes Mellitus II Maternal Grandmother    Hypercholesterolemia Maternal Grandmother    Breast cancer Maternal Grandmother    Cancer Paternal Grandmother    Diabetes Paternal Grandmother    Melanoma Paternal Grandmother    Stomach cancer Paternal Grandmother  ALLERGIES:  is allergic to metformin and related, nsaids, perphenazine, sulfa antibiotics, abilify [aripiprazole], and penicillins.  MEDICATIONS:  Current Outpatient Medications  Medication Sig Dispense Refill   acetaminophen (TYLENOL) 325 MG tablet Take 650 mg by mouth every 6 (six) hours as needed for headache (pain).     albuterol (VENTOLIN HFA) 108 (90 Base) MCG/ACT inhaler Inhale 2 puffs into the lungs every 6 (six) hours as needed for wheezing or shortness of breath. 1 each 5   amantadine (SYMMETREL) 100 MG capsule Take 100 mg by mouth 2 (two) times daily.     blood glucose meter kit and supplies Dispense based on patient and insurance preference. Use up to four times daily as directed. (FOR ICD-10 E10.9, E11.9). 1 each 0   Budeson-Glycopyrrol-Formoterol (BREZTRI AEROSPHERE) 160-9-4.8 MCG/ACT AERO Inhale 2 puffs into the lungs in the morning and at bedtime. 5.9 g 11   buPROPion (WELLBUTRIN XL) 150 MG 24 hr tablet Take 150 mg by mouth every morning.     capecitabine (XELODA) 150 MG tablet Take 300 mg by mouth 2 (two) times daily after a meal. Take along with 567m tablets. Take Monday- Friday. Take only on days of radiation.     capecitabine (XELODA) 500 MG tablet Take 2 tablets (1,000 mg total) by mouth 2 (two) times daily after a meal. Take along with 1518mtablets. Take Monday- Friday. Take only on days of radiation. 112 tablet 0   Cholecalciferol (VITAMIN D-3) 125 MCG (5000 UT) TABS Take 5,000 Units by mouth daily. 30 tablet 1   docusate sodium (COLACE) 100 MG capsule Take 1 capsule (100 mg total) by mouth 2 (two) times daily. 60 capsule 1   FARXIGA 10 MG TABS tablet TAKE 1 TABLET BY MOUTH  DAILY BEFORE BREAKFAST. 30 tablet 2   gabapentin (NEURONTIN) 300 MG capsule Take 300 mg by mouth 2 (two) times daily.     hydrocortisone cream 0.5 % Apply 1 Application topically 2 (two) times daily as needed for itching. 30 g 1   Hydrocortisone, Perianal, 1 % CREA Apply topically.     hydrOXYzine (VISTARIL) 50 MG capsule Take 50 mg by mouth 3 (three) times daily as needed for anxiety.     icosapent Ethyl (VASCEPA) 1 g capsule TAKE 2 CAPSULES BY MOUTH 2 TIMES DAILY 120 capsule 3   levothyroxine (SYNTHROID) 75 MCG tablet TAKE 1 TABLET BY MOUTH DAILY BEFORE BREAKFAST 90 tablet 0   lidocaine-prilocaine (EMLA) cream Apply to affected area once 30 g 3   loratadine (CLARITIN) 10 MG tablet Take 10 mg by mouth every morning.     losartan (COZAAR) 25 MG tablet Take 1 tablet (25 mg total) by mouth at bedtime. 30 tablet 3   NON FORMULARY Pt uses a cpap nightly     nystatin (MYCOSTATIN) 100000 UNIT/ML suspension Take 5 mLs (500,000 Units total) by mouth 4 (four) times daily. Swish and spit 60 mL 0   ondansetron (ZOFRAN) 8 MG tablet Take 1 tablet (8 mg total) by mouth 2 (two) times daily as needed for refractory nausea / vomiting. Start on day 3 after chemotherapy. 30 tablet 1   paliperidone (INVEGA SUSTENNA) 234 MG/1.5ML SUSY injection Inject 234 mg into the muscle every 30 (thirty) days. On or about the 14th of each month     pantoprazole (PROTONIX) 40 MG tablet TAKE 1 TABLET BY MOUTH DAILY 90 tablet 0   Pediatric Multivit-Minerals-C (CHEWABLES MULTIVITAMIN PO) Take 1 tablet by mouth daily.     pioglitazone (ACTOS) 15  MG tablet Take 1 tablet (15 mg total) by mouth daily. 30 tablet 3   prochlorperazine (COMPAZINE) 10 MG tablet Take 1 tablet (10 mg total) by mouth every 6 (six) hours as needed for nausea or vomiting. 60 tablet 3   rosuvastatin (CRESTOR) 40 MG tablet Take 1 tablet (40 mg total) by mouth every morning. 30 tablet 3   sertraline (ZOLOFT) 100 MG tablet Take 200 mg by mouth every morning.      Spacer/Aero-Holding Chambers (AEROCHAMBER MV) inhaler Use as instructed 1 each 0   No current facility-administered medications for this visit.     PHYSICAL EXAMINATION: ECOG PERFORMANCE STATUS: 0 - Asymptomatic Vitals:   08/01/22 1002  BP: 105/81  Pulse: 98  Resp: 18  Temp: (!) 96.6 F (35.9 C)    Filed Weights   08/01/22 1002  Weight: 132 lb 9.6 oz (60.1 kg)     Physical Exam Constitutional:      General: She is not in acute distress. HENT:     Head: Normocephalic and atraumatic.  Eyes:     General: No scleral icterus. Cardiovascular:     Rate and Rhythm: Normal rate and regular rhythm.     Heart sounds: Normal heart sounds.  Pulmonary:     Effort: Pulmonary effort is normal. No respiratory distress.     Breath sounds: Wheezing present.  Abdominal:     General: Bowel sounds are normal. There is no distension.     Palpations: Abdomen is soft.  Musculoskeletal:        General: No deformity. Normal range of motion.     Cervical back: Normal range of motion and neck supple.  Skin:    General: Skin is warm and dry.     Findings: No erythema or rash.  Neurological:     Mental Status: She is alert and oriented to person, place, and time. Mental status is at baseline.     Cranial Nerves: No cranial nerve deficit.     Coordination: Coordination normal.  Psychiatric:        Mood and Affect: Mood normal.     LABORATORY DATA:  I have reviewed the data as listed    Latest Ref Rng & Units 08/01/2022    9:39 AM 07/27/2022    1:41 PM 07/10/2022    1:35 PM  CBC  WBC 4.0 - 10.5 K/uL 8.1  6.8  5.1   Hemoglobin 12.0 - 15.0 g/dL 15.5  15.5  15.5   Hematocrit 36.0 - 46.0 % 45.6  46.1  46.3   Platelets 150 - 400 K/uL 144  144  115       Latest Ref Rng & Units 08/01/2022    9:39 AM 07/27/2022    1:41 PM 07/14/2022   10:50 AM  CMP  Glucose 70 - 99 mg/dL 258  151  234   BUN 6 - 20 mg/dL _0 Creatinine 0.44 - 1.00 mg/dL 1.41  1.07  1.30   Sodium 135 - 145 mmol/L 137   138  138   Potassium 3.5 - 5.1 mmol/L 3.5  4.2  4.1   Chloride 98 - 111 mmol/L 97  101  103   CO2 22 - 32 mmol/L _1 Calcium 8.9 - 10.3 mg/dL 9.2  9.5  9.1   Total Protein 6.5 - 8.1 g/dL 7.3  7.7    Total Bilirubin 0.3 - 1.2 mg/dL 0.4  0.4  Alkaline Phos 38 - 126 U/L 98  109    AST 15 - 41 U/L 28  24    ALT 0 - 44 U/L 16  24          RADIOGRAPHIC STUDIES: I have personally reviewed the radiological images as listed and agreed with the findings in the report. No results found.

## 2022-08-01 NOTE — Assessment & Plan Note (Signed)
Stable counts. monitor 

## 2022-08-01 NOTE — Assessment & Plan Note (Signed)
Recommend Colace 100 mg twice daily

## 2022-08-01 NOTE — Assessment & Plan Note (Signed)
Chemotherapy plan as listed above 

## 2022-08-01 NOTE — Progress Notes (Signed)
Patient here for follow up. Pt reports not having a bowel movement in 2 days

## 2022-08-02 ENCOUNTER — Encounter: Payer: Self-pay | Admitting: Oncology

## 2022-08-02 ENCOUNTER — Encounter: Payer: Self-pay | Admitting: Family Medicine

## 2022-08-02 ENCOUNTER — Encounter: Payer: Self-pay | Admitting: Nurse Practitioner

## 2022-08-02 ENCOUNTER — Ambulatory Visit
Admission: RE | Admit: 2022-08-02 | Discharge: 2022-08-02 | Disposition: A | Discharge status: Home / Self Care | Payer: Medicare Other | Source: Ambulatory Visit | Attending: Radiation Oncology | Admitting: Radiation Oncology

## 2022-08-02 ENCOUNTER — Other Ambulatory Visit: Payer: Self-pay

## 2022-08-02 DIAGNOSIS — C20 Malignant neoplasm of rectum: Secondary | ICD-10-CM | POA: Diagnosis not present

## 2022-08-02 LAB — RAD ONC ARIA SESSION SUMMARY
Course Elapsed Days: 6
Plan Fractions Treated to Date: 4
Plan Prescribed Dose Per Fraction: 1.8 Gy
Plan Total Fractions Prescribed: 25
Plan Total Prescribed Dose: 45 Gy
Reference Point Dosage Given to Date: 7.2 Gy
Reference Point Session Dosage Given: 1.8 Gy
Session Number: 4

## 2022-08-03 ENCOUNTER — Encounter: Payer: Self-pay | Admitting: Oncology

## 2022-08-03 ENCOUNTER — Other Ambulatory Visit: Payer: Self-pay

## 2022-08-03 ENCOUNTER — Encounter: Payer: Self-pay | Admitting: Nurse Practitioner

## 2022-08-03 ENCOUNTER — Ambulatory Visit
Admission: RE | Admit: 2022-08-03 | Discharge: 2022-08-03 | Disposition: A | Discharge status: Home / Self Care | Payer: Medicare Other | Source: Ambulatory Visit | Attending: Radiation Oncology | Admitting: Radiation Oncology

## 2022-08-03 ENCOUNTER — Other Ambulatory Visit (HOSPITAL_COMMUNITY): Payer: Self-pay

## 2022-08-03 DIAGNOSIS — C20 Malignant neoplasm of rectum: Secondary | ICD-10-CM | POA: Diagnosis not present

## 2022-08-03 LAB — RAD ONC ARIA SESSION SUMMARY
Course Elapsed Days: 7
Plan Fractions Treated to Date: 5
Plan Prescribed Dose Per Fraction: 1.8 Gy
Plan Total Fractions Prescribed: 25
Plan Total Prescribed Dose: 45 Gy
Reference Point Dosage Given to Date: 9 Gy
Reference Point Session Dosage Given: 1.8 Gy
Session Number: 5

## 2022-08-04 ENCOUNTER — Inpatient Hospital Stay: Payer: Medicare Other

## 2022-08-04 ENCOUNTER — Ambulatory Visit
Admission: RE | Admit: 2022-08-04 | Discharge: 2022-08-04 | Disposition: A | Discharge status: Home / Self Care | Payer: Medicare Other | Source: Ambulatory Visit | Attending: Radiation Oncology | Admitting: Radiation Oncology

## 2022-08-04 ENCOUNTER — Other Ambulatory Visit: Payer: Self-pay

## 2022-08-04 VITALS — BP 106/73 | HR 97 | Temp 96.2°F | Resp 17

## 2022-08-04 DIAGNOSIS — C20 Malignant neoplasm of rectum: Secondary | ICD-10-CM

## 2022-08-04 LAB — RAD ONC ARIA SESSION SUMMARY
Course Elapsed Days: 8
Plan Fractions Treated to Date: 6
Plan Prescribed Dose Per Fraction: 1.8 Gy
Plan Total Fractions Prescribed: 25
Plan Total Prescribed Dose: 45 Gy
Reference Point Dosage Given to Date: 10.8 Gy
Reference Point Session Dosage Given: 1.8 Gy
Session Number: 6

## 2022-08-04 LAB — BASIC METABOLIC PANEL
Anion gap: 12 (ref 5–15)
BUN: 18 mg/dL (ref 6–20)
CO2: 25 mmol/L (ref 22–32)
Calcium: 9.2 mg/dL (ref 8.9–10.3)
Chloride: 100 mmol/L (ref 98–111)
Creatinine, Ser: 1.05 mg/dL — ABNORMAL HIGH (ref 0.44–1.00)
GFR, Estimated: >60 mL/min (ref >60)
Glucose, Bld: 167 mg/dL — ABNORMAL HIGH (ref 70–99)
Potassium: 3.7 mmol/L (ref 3.5–5.1)
Sodium: 137 mmol/L (ref 135–145)

## 2022-08-04 MED ORDER — SODIUM CHLORIDE 0.9 % IV SOLN
Freq: Once | INTRAVENOUS | Status: AC
  Filled 2022-08-04: qty 250

## 2022-08-04 MED ORDER — SODIUM CHLORIDE 0.9% FLUSH
10.0000 mL | Freq: Once | INTRAVENOUS | Status: AC | PRN
  Administered 2022-08-04: 10 mL
  Filled 2022-08-04: qty 10

## 2022-08-04 MED ORDER — HEPARIN SOD (PORK) LOCK FLUSH 100 UNIT/ML IV SOLN
500.0000 [IU] | Freq: Once | INTRAVENOUS | Status: AC | PRN
  Administered 2022-08-04: 500 [IU]
  Filled 2022-08-04: qty 5

## 2022-08-04 NOTE — Progress Notes (Signed)
Pt complaining of abdominal cramping this morning. Had port labs then radiation and back for IVF's. Completed her IVF's. Her colostomy bag came loose and leaked everywhere. Assisted pt in clean up and placing a new ostomy bag on.Pt discharged to home on [facility] van.

## 2022-08-07 ENCOUNTER — Other Ambulatory Visit: Payer: Self-pay

## 2022-08-07 ENCOUNTER — Ambulatory Visit
Admission: RE | Admit: 2022-08-07 | Discharge: 2022-08-07 | Disposition: A | Discharge status: Home / Self Care | Payer: Medicare Other | Source: Ambulatory Visit | Attending: Radiation Oncology | Admitting: Radiation Oncology

## 2022-08-07 ENCOUNTER — Inpatient Hospital Stay: Payer: Medicare Other

## 2022-08-07 DIAGNOSIS — C20 Malignant neoplasm of rectum: Secondary | ICD-10-CM | POA: Diagnosis not present

## 2022-08-07 LAB — RAD ONC ARIA SESSION SUMMARY
Course Elapsed Days: 11
Plan Fractions Treated to Date: 7
Plan Prescribed Dose Per Fraction: 1.8 Gy
Plan Total Fractions Prescribed: 25
Plan Total Prescribed Dose: 45 Gy
Reference Point Dosage Given to Date: 12.6 Gy
Reference Point Session Dosage Given: 1.8 Gy
Session Number: 7

## 2022-08-08 ENCOUNTER — Ambulatory Visit
Admission: RE | Admit: 2022-08-08 | Discharge: 2022-08-08 | Disposition: A | Discharge status: Home / Self Care | Payer: Medicare Other | Source: Ambulatory Visit | Attending: Radiation Oncology | Admitting: Radiation Oncology

## 2022-08-08 ENCOUNTER — Inpatient Hospital Stay: Payer: Medicare Other

## 2022-08-08 ENCOUNTER — Other Ambulatory Visit: Payer: Self-pay

## 2022-08-08 DIAGNOSIS — C20 Malignant neoplasm of rectum: Secondary | ICD-10-CM | POA: Diagnosis not present

## 2022-08-08 LAB — RAD ONC ARIA SESSION SUMMARY
Course Elapsed Days: 12
Plan Fractions Treated to Date: 8
Plan Prescribed Dose Per Fraction: 1.8 Gy
Plan Total Fractions Prescribed: 25
Plan Total Prescribed Dose: 45 Gy
Reference Point Dosage Given to Date: 14.4 Gy
Reference Point Session Dosage Given: 1.8 Gy
Session Number: 8

## 2022-08-09 ENCOUNTER — Inpatient Hospital Stay: Payer: Medicare Other

## 2022-08-09 ENCOUNTER — Encounter: Payer: Self-pay | Admitting: Oncology

## 2022-08-09 ENCOUNTER — Ambulatory Visit
Admission: RE | Admit: 2022-08-09 | Discharge: 2022-08-09 | Disposition: A | Discharge status: Home / Self Care | Payer: Medicare Other | Source: Ambulatory Visit | Attending: Radiation Oncology | Admitting: Radiation Oncology

## 2022-08-09 ENCOUNTER — Inpatient Hospital Stay (HOSPITAL_BASED_OUTPATIENT_CLINIC_OR_DEPARTMENT_OTHER): Payer: Medicare Other | Admitting: Oncology

## 2022-08-09 ENCOUNTER — Other Ambulatory Visit: Payer: Self-pay

## 2022-08-09 VITALS — BP 110/76 | HR 78 | Temp 96.5°F | Resp 18 | Wt 131.5 lb

## 2022-08-09 DIAGNOSIS — C20 Malignant neoplasm of rectum: Secondary | ICD-10-CM

## 2022-08-09 DIAGNOSIS — Z5111 Encounter for antineoplastic chemotherapy: Secondary | ICD-10-CM | POA: Diagnosis not present

## 2022-08-09 DIAGNOSIS — Z7189 Other specified counseling: Secondary | ICD-10-CM

## 2022-08-09 DIAGNOSIS — D696 Thrombocytopenia, unspecified: Secondary | ICD-10-CM | POA: Diagnosis not present

## 2022-08-09 LAB — CBC WITH DIFFERENTIAL/PLATELET
Abs Immature Granulocytes: 0.03 10*3/uL (ref 0.00–0.07)
Basophils Absolute: 0 10*3/uL (ref 0.0–0.1)
Basophils Relative: 1 %
Eosinophils Absolute: 0.2 10*3/uL (ref 0.0–0.5)
Eosinophils Relative: 3 %
HCT: 43.7 % (ref 36.0–46.0)
Hemoglobin: 14.6 g/dL (ref 12.0–15.0)
Immature Granulocytes: 0 %
Lymphocytes Relative: 18 %
Lymphs Abs: 1.2 10*3/uL (ref 0.7–4.0)
MCH: 32.2 pg (ref 26.0–34.0)
MCHC: 33.4 g/dL (ref 30.0–36.0)
MCV: 96.5 fL (ref 80.0–100.0)
Monocytes Absolute: 0.6 10*3/uL (ref 0.1–1.0)
Monocytes Relative: 10 %
Neutro Abs: 4.6 10*3/uL (ref 1.7–7.7)
Neutrophils Relative %: 68 %
Platelets: 94 10*3/uL — ABNORMAL LOW (ref 150–400)
RBC: 4.53 MIL/uL (ref 3.87–5.11)
RDW: 17.4 % — ABNORMAL HIGH (ref 11.5–15.5)
WBC: 6.7 10*3/uL (ref 4.0–10.5)
nRBC: 0 % (ref 0.0–0.2)

## 2022-08-09 LAB — RAD ONC ARIA SESSION SUMMARY
Course Elapsed Days: 13
Plan Fractions Treated to Date: 9
Plan Prescribed Dose Per Fraction: 1.8 Gy
Plan Total Fractions Prescribed: 25
Plan Total Prescribed Dose: 45 Gy
Reference Point Dosage Given to Date: 16.2 Gy
Reference Point Session Dosage Given: 1.8 Gy
Session Number: 9

## 2022-08-09 LAB — COMPREHENSIVE METABOLIC PANEL
ALT: 15 U/L (ref 0–44)
AST: 17 U/L (ref 15–41)
Albumin: 4.2 g/dL (ref 3.5–5.0)
Alkaline Phosphatase: 82 U/L (ref 38–126)
Anion gap: 8 (ref 5–15)
BUN: 17 mg/dL (ref 6–20)
CO2: 27 mmol/L (ref 22–32)
Calcium: 9.2 mg/dL (ref 8.9–10.3)
Chloride: 100 mmol/L (ref 98–111)
Creatinine, Ser: 1.03 mg/dL — ABNORMAL HIGH (ref 0.44–1.00)
GFR, Estimated: >60 mL/min (ref >60)
Glucose, Bld: 130 mg/dL — ABNORMAL HIGH (ref 70–99)
Potassium: 3.9 mmol/L (ref 3.5–5.1)
Sodium: 135 mmol/L (ref 135–145)
Total Bilirubin: 0.6 mg/dL (ref 0.3–1.2)
Total Protein: 7.6 g/dL (ref 6.5–8.1)

## 2022-08-09 MED ORDER — SODIUM CHLORIDE 0.9% FLUSH
10.0000 mL | Freq: Once | INTRAVENOUS | Status: AC | PRN
  Administered 2022-08-09: 10 mL
  Filled 2022-08-09: qty 10

## 2022-08-09 MED ORDER — HEPARIN SOD (PORK) LOCK FLUSH 100 UNIT/ML IV SOLN
500.0000 [IU] | Freq: Once | INTRAVENOUS | Status: AC | PRN
  Administered 2022-08-09: 500 [IU]
  Filled 2022-08-09: qty 5

## 2022-08-09 MED ORDER — ONDANSETRON HCL 4 MG/2ML IJ SOLN
4.0000 mg | Freq: Once | INTRAMUSCULAR | Status: AC
  Administered 2022-08-09: 4 mg via INTRAVENOUS

## 2022-08-09 MED ORDER — SODIUM CHLORIDE 0.9 % IV SOLN
Freq: Once | INTRAVENOUS | Status: AC
  Filled 2022-08-09: qty 250

## 2022-08-09 MED ORDER — SODIUM CHLORIDE 0.9 % IV SOLN
Freq: Once | INTRAVENOUS | Status: DC
  Filled 2022-08-09: qty 250

## 2022-08-09 NOTE — Assessment & Plan Note (Addendum)
Stage III. pT3 pN1b cM0, s/p APR. Currently on adjuvant chemotherapy. S/p FOLFOX x 4 cycles Labs reviewed and discussed with patient Continue Xeloda 1300 mg twice daily.  Overall she tolerates well. Proceed with IV fluid normal saline 1 L x 1. Mild nausea, IV Zofran 4 mg x 1.

## 2022-08-09 NOTE — Progress Notes (Signed)
Pt here for follow up. No new concerns voiced.   

## 2022-08-09 NOTE — Progress Notes (Signed)
Hematology/Oncology Progress note [phone] [phone]            Patient Care Team: Steele Sizer, MD as PCP - General (Family Medicine) Sharmaine Base, MD as Referring Physician (Psychiatry) Bjorn Loser, MD as Consulting Physician (Urology) Germaine Pomfret, [facility] (Pharmacist) Clent Jacks, RN as Oncology Nurse Navigator [name], MD as Consulting Physician (Oncology)   ASSESSMENT & PLAN:   Cancer Staging  Rectal cancer [facility]) Staging form: Colon and Rectum, AJCC 8th Edition - Pathologic stage from 05/03/2022: Stage IIIB (pT3, pN1b, cM0) - Signed by [name], MD on 05/03/2022   Rectal cancer ([place]) Stage III. pT3 pN1b cM0, s/p APR. Currently on adjuvant chemotherapy. S/p FOLFOX x 4 cycles Labs reviewed and discussed with patient Continue Xeloda 1300 mg twice daily.  Overall she tolerates well. Proceed with IV fluid normal saline 1 L x 1. Mild nausea, IV Zofran 4 mg x 1.  Encounter for antineoplastic chemotherapy Chemotherapy plan as listed above  Thrombocytopenia (HCC) Trending low, close monitor.  No orders of the defined types were placed in this encounter.  Follow-up in 1 week lab NP IV fluid 2 weeks lab MD IV fluid  All questions were answered. The patient knows to call the clinic with any problems, questions or concerns.  [name], MD, PhD [facility] Hematology Oncology 08/09/2022      CHIEF COMPLAINTS/REASON FOR VISIT:  Follow-up for rectal cancer treatments.  HISTORY OF PRESENTING ILLNESS:   April Luna is a  51 y.o.  female presents for treatment of rectal cancer Oncology History  Rectal cancer ([place])  02/26/2022 Imaging   MRI PELVIS WITHOUT CONTRAST- By imaging, rectal cancer stage:  T1/T2, N0, Mx    03/02/2022 Imaging   CT CHEST AND ABDOMEN WITH CONTRAST 1. No convincing evidence of metastatic disease within the chest or abdomen. 2. Atrophic left kidney with multifocal renal scarring and cortical  calcifications as well as nonobstructive renal stones measuring up to 5 mm. 3. Prominent left-sided predominant retroperitoneal lymph nodes measuring up to 8 mm near the level of the renal hilum, overall decreased in size dating back to CT September 19, 2018 and favored reactive related to left renal inflammation. 4.  Aortic Atherosclerosis (ICD10-I70.0).   03/27/2022 Genetic Testing    [facility] Custom[employer]+RNA cancer panel found no pathogenic mutations.    04/21/2022 Initial Diagnosis   Rectal cancer - baseline CEA 3.6 -02/09/2022, patient had colonoscopy which showed renal mass 10 cm from anal verge.  5 mm polyp in ascending colon.  Removed and retrieved. Pathology showed rectal adenocarcinoma.  The polyp in the ascending colon is a tubular adenoma. -04/21/2022, patient underwent robotic assisted ultralow anterior resection. Pathology showed moderately differentiated adenocarcinoma, 4.5 cm in maximal extent, with focal extension through muscularis propria into perirectal soft tissue.  3 lymph nodes positive for metastatic carcinoma.  Negative margin.  pT3 pN1b, MSI stable.   05/03/2022 Cancer Staging   Staging form: Colon and Rectum, AJCC 8th Edition - Pathologic stage from 05/03/2022: Stage IIIB (pT3, pN1b, cM0) - Signed by [name], MD on 05/03/2022 Stage prefix: Initial diagnosis   05/11/2022 Miscellaneous   Medi port placed by Dr.White   05/26/2022 -  Chemotherapy   FOLFOX Q2 weeks x 4   07/27/2022 -  Chemotherapy   Concurrent chemotherapy- Xeloda  1349m BID and radiation.    Patient has bipolar and schizophrenia..  Patient is married and lives at home with her husband and son.  Patient tolerates Xeloda chemotherapy  and radiation + Constipation, has improved.  No significant increase of ostomy output.  Some nausea today.  No vomiting.  No abdominal pain.  No fever or chills.  Review of Systems  Constitutional:  Negative for appetite change, chills, fatigue and fever.  HENT:   Negative for  hearing loss and voice change.   Eyes:  Negative for eye problems.  Respiratory:  Negative for chest tightness and cough.   Cardiovascular:  Negative for chest pain.  Gastrointestinal:  Negative for abdominal distention, abdominal pain and blood in stool.  Endocrine: Negative for hot flashes.  Genitourinary:  Negative for difficulty urinating and frequency.   Musculoskeletal:  Negative for arthralgias.  Skin:  Negative for itching and rash.  Neurological:  Negative for extremity weakness.  Hematological:  Negative for adenopathy.  Psychiatric/Behavioral:  Negative for confusion.     MEDICAL HISTORY:  Past Medical History:  Diagnosis Date   Allergy    pollen   Anxiety    Arthritis    right hip   Bipolar 1 disorder (HCC)    Cancer (HCC)    rectal   Chronic kidney disease    COPD (chronic obstructive pulmonary disease) (HCC)    Depression    Family history of adverse reaction to anesthesia    grand father had a stroke during anesthesia   Family history of breast cancer    Family history of colon cancer    Family history of uterine cancer    GERD (gastroesophageal reflux disease)    History of kidney stones    Hyperlipidemia    Hypertension    Hypothyroidism    Panic attack    Pneumonia    Psoriasis    Sleep apnea 08/11/2021   No CPAP   Type 2 diabetes mellitus with microalbuminuria, without long-term current use of insulin ([place]) 06/24/2019    SURGICAL HISTORY: Past Surgical History:  Procedure Laterality Date   BREAST BIOPSY Left 12/14/2021   [place] bx, venus marker, path pending   CESAREAN SECTION     COLONOSCOPY WITH PROPOFOL N/A 02/09/2022   Procedure: COLONOSCOPY WITH PROPOFOL;  Surgeon: Lin Landsman, MD;  Location: [place];  Service: Endoscopy;  Laterality: N/A;  sleep apnea   CYSTOSCOPY W/ RETROGRADES Left 11/08/2018   Procedure: CYSTOSCOPY WITH RETROGRADE PYELOGRAM;  Surgeon: Billey Co, MD;  Location: [facility];  Service: Urology;   Laterality: Left;   CYSTOSCOPY/URETEROSCOPY/HOLMIUM LASER/STENT PLACEMENT Left 11/08/2018   Procedure: CYSTOSCOPY/URETEROSCOPY/HOLMIUM LASER/STENT PLACEMENT;  Surgeon: Billey Co, MD;  Location: [facility];  Service: Urology;  Laterality: Left;   IR IMAGING GUIDED PORT INSERTION  05/11/2022   MOUTH SURGERY     wisdom teeth extraction   MOUTH SURGERY     teeth removal   POLYPECTOMY N/A 02/09/2022   Procedure: POLYPECTOMY;  Surgeon: Lin Landsman, MD;  Location: [place];  Service: Endoscopy;  Laterality: N/A;   XI ROBOTIC ASSISTED LOWER ANTERIOR RESECTION N/A 04/21/2022   Procedure: XI ROBOTIC ASSISTED LOWER ANTERIOR RESECTION WITH COLOSTOMY, BILATERAL TAP BLOCK, ASSESSMENT OF TISSUE PERFUSSION WITH FIREFLY INJECTION;  Surgeon: Ileana Roup, MD;  Location: [facility];  Service: General;  Laterality: N/A;    SOCIAL HISTORY: Social History   Socioeconomic History   Marital status: Married    Spouse name: Montine Circle   Number of children: 1   Years of education: 12   Highest education level: High school graduate  Occupational History   Occupation: unemployed    Comment: disabled  Tobacco Use   Smoking status: Every Day    Packs/day: 0.25    Years: 34.00    Total pack years: 8.50    Types: Cigarettes    Start date: 05/13/1985   Smokeless tobacco: Never   Tobacco comments:    1 PPD 04/20/223  Vaping Use   Vaping Use: Former   Start date: 05/25/2018   Quit date: 09/24/2018  Substance and Sexual Activity   Alcohol use: Not Currently    Alcohol/week: 4.0 standard drinks of alcohol    Types: 4 Glasses of wine per week    Comment: quit ETOH in Feb. 2023   Drug use: Not Currently    Types: Marijuana    Comment: 2 days ago   Sexual activity: Not Currently    Birth control/protection: Post-menopausal  Other Topics Concern   Not on file  Social History Narrative   Lives with husband and son    Social Determinants of Health   Financial Resource  Strain: Medium Risk (12/02/2021)   Overall Financial Resource Strain (CARDIA)    Difficulty of Paying Living Expenses: Somewhat hard  Food Insecurity: No Food Insecurity (12/02/2021)   Hunger Vital Sign    Worried About Running Out of Food in the Last Year: Never true    Ran Out of Food in the Last Year: Never true  Transportation Needs: Unmet Transportation Needs (08/09/2022)   PRAPARE - Transportation    Lack of Transportation (Medical): Yes    Lack of Transportation (Non-Medical): Yes  Physical Activity: Inactive (12/02/2021)   Exercise Vital Sign    Days of Exercise per Week: 0 days    Minutes of Exercise per Session: 0 min  Stress: No Stress Concern Present (12/02/2021)   [place] of Stress : Not at all  Social Connections: Moderately Integrated (12/02/2021)   Social Connection and Isolation Panel [NHANES]    Frequency of Communication with Friends and Family: More than three times a week    Frequency of Social Gatherings with Friends and Family: Twice a week    Attends Religious Services: More than 4 times per year    Active Member of [employer] or Organizations: No    Attends Archivist Meetings: Never    Marital Status: Married  Human resources officer Violence: Not At Risk (07/07/2021)   Humiliation, Afraid, Rape, and Kick questionnaire    Fear of Current or Ex-Partner: No    Emotionally Abused: No    Physically Abused: No    Sexually Abused: No    FAMILY HISTORY: Family History  Problem Relation Age of Onset   Depression Mother    Anxiety disorder Mother    Diabetes Mother    Hypertension Mother    Hyperlipidemia Mother    Cancer Mother    Uterine cancer Mother 75   Cervical cancer Mother 55   Colon cancer Father    Depression Brother    Anxiety disorder Brother    Cancer Maternal Aunt        unk types   Diabetes Mellitus II Maternal Grandmother    Hypercholesterolemia Maternal  Grandmother    Breast cancer Maternal Grandmother    Cancer Paternal Grandmother    Diabetes Paternal Grandmother    Melanoma Paternal Grandmother    Stomach cancer Paternal Grandmother     ALLERGIES:  is allergic to metformin and related, nsaids, perphenazine, sulfa antibiotics, abilify [aripiprazole], and penicillins.  MEDICATIONS:  Current Outpatient Medications  Medication Sig Dispense Refill   acetaminophen (TYLENOL) 325 MG tablet Take 650 mg by mouth every 6 (six) hours as needed for headache (pain).     albuterol (VENTOLIN HFA) 108 (90 Base) MCG/ACT inhaler Inhale 2 puffs into the lungs every 6 (six) hours as needed for wheezing or shortness of breath. 1 each 5   amantadine (SYMMETREL) 100 MG capsule Take 100 mg by mouth 2 (two) times daily.     blood glucose meter kit and supplies Dispense based on patient and insurance preference. Use up to four times daily as directed. (FOR ICD-10 E10.9, E11.9). 1 each 0   Budeson-Glycopyrrol-Formoterol (BREZTRI AEROSPHERE) 160-9-4.8 MCG/ACT AERO Inhale 2 puffs into the lungs in the morning and at bedtime. 5.9 g 11   buPROPion (WELLBUTRIN XL) 150 MG 24 hr tablet Take 150 mg by mouth every morning.     capecitabine (XELODA) 150 MG tablet Take 300 mg by mouth 2 (two) times daily after a meal. Take along with 543m tablets. Take Monday- Friday. Take only on days of radiation.     capecitabine (XELODA) 500 MG tablet Take 2 tablets (1,000 mg total) by mouth 2 (two) times daily after a meal. Take along with 1562mtablets. Take Monday- Friday. Take only on days of radiation. 112 tablet 0   Cholecalciferol (VITAMIN D-3) 125 MCG (5000 UT) TABS Take 5,000 Units by mouth daily. 30 tablet 1   FARXIGA 10 MG TABS tablet TAKE 1 TABLET BY MOUTH DAILY BEFORE BREAKFAST. 30 tablet 2   gabapentin (NEURONTIN) 300 MG capsule Take 300 mg by mouth 2 (two) times daily.     hydrocortisone cream 0.5 % Apply 1 Application topically 2 (two) times daily as needed for itching. 30  g 1   Hydrocortisone, Perianal, 1 % CREA Apply topically.     hydrOXYzine (VISTARIL) 50 MG capsule Take 50 mg by mouth 3 (three) times daily as needed for anxiety.     icosapent Ethyl (VASCEPA) 1 g capsule TAKE 2 CAPSULES BY MOUTH 2 TIMES DAILY 120 capsule 3   levothyroxine (SYNTHROID) 75 MCG tablet TAKE 1 TABLET BY MOUTH DAILY BEFORE BREAKFAST 90 tablet 0   lidocaine-prilocaine (EMLA) cream Apply to affected area once 30 g 3   loratadine (CLARITIN) 10 MG tablet Take 10 mg by mouth every morning.     losartan (COZAAR) 25 MG tablet Take 1 tablet (25 mg total) by mouth at bedtime. 30 tablet 3   NON FORMULARY Pt uses a cpap nightly     nystatin (MYCOSTATIN) 100000 UNIT/ML suspension Take 5 mLs (500,000 Units total) by mouth 4 (four) times daily. Swish and spit 60 mL 0   ondansetron (ZOFRAN) 8 MG tablet Take 1 tablet (8 mg total) by mouth 2 (two) times daily as needed for refractory nausea / vomiting. Start on day 3 after chemotherapy. 30 tablet 1   paliperidone (INVEGA SUSTENNA) 234 MG/1.5ML SUSY injection Inject 234 mg into the muscle every 30 (thirty) days. On or about the 14th of each month     pantoprazole (PROTONIX) 40 MG tablet TAKE 1 TABLET BY MOUTH DAILY 90 tablet 0   Pediatric Multivit-Minerals-C (CHEWABLES MULTIVITAMIN PO) Take 1 tablet by mouth daily.     pioglitazone (ACTOS) 15 MG tablet Take 1 tablet (15 mg total) by mouth daily. 30 tablet 3   prochlorperazine (COMPAZINE) 10 MG tablet Take 1 tablet (10 mg total) by mouth every 6 (six) hours as needed for nausea or vomiting. 60 tablet 3   rosuvastatin (CRESTOR)  40 MG tablet Take 1 tablet (40 mg total) by mouth every morning. 30 tablet 3   sertraline (ZOLOFT) 100 MG tablet Take 200 mg by mouth every morning.     Spacer/Aero-Holding Chambers (AEROCHAMBER MV) inhaler Use as instructed 1 each 0   docusate sodium (COLACE) 100 MG capsule Take 1 capsule (100 mg total) by mouth 2 (two) times daily. (Patient not taking: Reported on 08/09/2022) 60  capsule 1   No current facility-administered medications for this visit.     PHYSICAL EXAMINATION: ECOG PERFORMANCE STATUS: 0 - Asymptomatic Vitals:   08/09/22 0954  BP: 110/76  Pulse: 78  Resp: 18  Temp: (!) 96.5 F (35.8 C)    Filed Weights   08/09/22 0954  Weight: 131 lb 8 oz (59.6 kg)     Physical Exam Constitutional:      General: She is not in acute distress. HENT:     Head: Normocephalic and atraumatic.  Eyes:     General: No scleral icterus. Cardiovascular:     Rate and Rhythm: Normal rate and regular rhythm.     Heart sounds: Normal heart sounds.  Pulmonary:     Effort: Pulmonary effort is normal. No respiratory distress.     Breath sounds: Wheezing present.  Abdominal:     General: Bowel sounds are normal. There is no distension.     Palpations: Abdomen is soft.     Comments: Ostomy   Musculoskeletal:        General: No deformity. Normal range of motion.     Cervical back: Normal range of motion and neck supple.  Skin:    General: Skin is warm and dry.     Findings: No erythema or rash.  Neurological:     Mental Status: She is alert and oriented to person, place, and time. Mental status is at baseline.     Cranial Nerves: No cranial nerve deficit.     Coordination: Coordination normal.  Psychiatric:        Mood and Affect: Mood normal.     LABORATORY DATA:  I have reviewed the data as listed    Latest Ref Rng & Units 08/09/2022    9:33 AM 08/01/2022    9:39 AM 07/27/2022    1:41 PM  CBC  WBC 4.0 - 10.5 K/uL 6.7  8.1  6.8   Hemoglobin 12.0 - 15.0 g/dL 14.6  15.5  15.5   Hematocrit 36.0 - 46.0 % 43.7  45.6  46.1   Platelets 150 - 400 K/uL 94  144  144       Latest Ref Rng & Units 08/09/2022    9:33 AM 08/04/2022    8:15 AM 08/01/2022    9:39 AM  CMP  Glucose 70 - 99 mg/dL 130  167  258   BUN 6 - 20 mg/dL '17  18  15   ' Creatinine 0.44 - 1.00 mg/dL 1.03  1.05  1.41   Sodium 135 - 145 mmol/L 135  137  137   Potassium 3.5 - 5.1 mmol/L 3.9  3.7   3.5   Chloride 98 - 111 mmol/L 100  100  97   CO2 22 - 32 mmol/L '27  25  27   ' Calcium 8.9 - 10.3 mg/dL 9.2  9.2  9.2   Total Protein 6.5 - 8.1 g/dL 7.6   7.3   Total Bilirubin 0.3 - 1.2 mg/dL 0.6   0.4   Alkaline Phos 38 - 126 U/L 82  98   AST 15 - 41 U/L 17   28   ALT 0 - 44 U/L 15   16         RADIOGRAPHIC STUDIES: I have personally reviewed the radiological images as listed and agreed with the findings in the report. No results found.

## 2022-08-09 NOTE — Assessment & Plan Note (Signed)
Chemotherapy plan as listed above 

## 2022-08-09 NOTE — Assessment & Plan Note (Signed)
Trending low, close monitor. 

## 2022-08-10 ENCOUNTER — Inpatient Hospital Stay: Payer: Medicare Other

## 2022-08-10 ENCOUNTER — Ambulatory Visit: Payer: Medicare Other

## 2022-08-11 ENCOUNTER — Inpatient Hospital Stay: Payer: Medicare Other

## 2022-08-11 ENCOUNTER — Encounter: Payer: Self-pay | Admitting: *Deleted

## 2022-08-11 ENCOUNTER — Ambulatory Visit
Admission: RE | Admit: 2022-08-11 | Discharge: 2022-08-11 | Disposition: A | Discharge status: Home / Self Care | Payer: Medicare Other | Source: Ambulatory Visit | Attending: Radiation Oncology | Admitting: Radiation Oncology

## 2022-08-11 ENCOUNTER — Other Ambulatory Visit: Payer: Self-pay

## 2022-08-11 DIAGNOSIS — C20 Malignant neoplasm of rectum: Secondary | ICD-10-CM | POA: Diagnosis not present

## 2022-08-11 LAB — RAD ONC ARIA SESSION SUMMARY
Course Elapsed Days: 15
Plan Fractions Treated to Date: 10
Plan Prescribed Dose Per Fraction: 1.8 Gy
Plan Total Fractions Prescribed: 25
Plan Total Prescribed Dose: 45 Gy
Reference Point Dosage Given to Date: 18 Gy
Reference Point Session Dosage Given: 1.8 Gy
Session Number: 10

## 2022-08-14 ENCOUNTER — Ambulatory Visit: Payer: Medicare Other

## 2022-08-14 ENCOUNTER — Encounter: Payer: Self-pay | Admitting: *Deleted

## 2022-08-14 ENCOUNTER — Inpatient Hospital Stay: Payer: Medicare Other

## 2022-08-14 ENCOUNTER — Telehealth: Payer: Self-pay | Admitting: *Deleted

## 2022-08-14 NOTE — Telephone Encounter (Signed)
Patient called reporting that she has had a sudden onset of upset stomach and has a lot of nausea and is unsure if she should come in for her radiation therapy treatment today or not. Please return her call to let her know

## 2022-08-14 NOTE — Progress Notes (Signed)
Spoke with the patient prior to this message and the patient did not get treated today.  Therapist were made aware.

## 2022-08-15 ENCOUNTER — Encounter: Payer: Self-pay | Admitting: Oncology

## 2022-08-15 ENCOUNTER — Encounter: Payer: Self-pay | Admitting: Nurse Practitioner

## 2022-08-15 ENCOUNTER — Other Ambulatory Visit: Payer: Self-pay

## 2022-08-15 ENCOUNTER — Ambulatory Visit
Admission: RE | Admit: 2022-08-15 | Discharge: 2022-08-15 | Disposition: A | Discharge status: Home / Self Care | Payer: Medicare Other | Source: Ambulatory Visit | Attending: Radiation Oncology | Admitting: Radiation Oncology

## 2022-08-15 ENCOUNTER — Other Ambulatory Visit (HOSPITAL_COMMUNITY): Payer: Self-pay

## 2022-08-15 ENCOUNTER — Inpatient Hospital Stay: Payer: Medicare Other

## 2022-08-15 DIAGNOSIS — C20 Malignant neoplasm of rectum: Secondary | ICD-10-CM | POA: Diagnosis not present

## 2022-08-15 LAB — RAD ONC ARIA SESSION SUMMARY
Course Elapsed Days: 19
Plan Fractions Treated to Date: 11
Plan Prescribed Dose Per Fraction: 1.8 Gy
Plan Total Fractions Prescribed: 25
Plan Total Prescribed Dose: 45 Gy
Reference Point Dosage Given to Date: 19.8 Gy
Reference Point Session Dosage Given: 1.8 Gy
Session Number: 11

## 2022-08-16 ENCOUNTER — Ambulatory Visit
Admission: RE | Admit: 2022-08-16 | Discharge: 2022-08-16 | Disposition: A | Discharge status: Home / Self Care | Payer: Medicare Other | Source: Ambulatory Visit | Attending: Radiation Oncology | Admitting: Radiation Oncology

## 2022-08-16 ENCOUNTER — Other Ambulatory Visit: Payer: Self-pay

## 2022-08-16 ENCOUNTER — Inpatient Hospital Stay: Payer: Medicare Other

## 2022-08-16 DIAGNOSIS — C20 Malignant neoplasm of rectum: Secondary | ICD-10-CM | POA: Diagnosis not present

## 2022-08-16 LAB — RAD ONC ARIA SESSION SUMMARY
Course Elapsed Days: 20
Plan Fractions Treated to Date: 12
Plan Prescribed Dose Per Fraction: 1.8 Gy
Plan Total Fractions Prescribed: 25
Plan Total Prescribed Dose: 45 Gy
Reference Point Dosage Given to Date: 21.6 Gy
Reference Point Session Dosage Given: 1.8 Gy
Session Number: 12

## 2022-08-17 ENCOUNTER — Inpatient Hospital Stay: Payer: Medicare Other

## 2022-08-17 ENCOUNTER — Ambulatory Visit
Admission: RE | Admit: 2022-08-17 | Discharge: 2022-08-17 | Disposition: A | Discharge status: Home / Self Care | Payer: Medicare Other | Source: Ambulatory Visit | Attending: Radiation Oncology | Admitting: Radiation Oncology

## 2022-08-17 ENCOUNTER — Other Ambulatory Visit: Payer: Self-pay

## 2022-08-17 ENCOUNTER — Inpatient Hospital Stay (HOSPITAL_BASED_OUTPATIENT_CLINIC_OR_DEPARTMENT_OTHER): Payer: Medicare Other | Admitting: Medical Oncology

## 2022-08-17 ENCOUNTER — Encounter: Payer: Self-pay | Admitting: Medical Oncology

## 2022-08-17 VITALS — BP 117/78 | HR 110 | Temp 96.6°F | Ht 59.0 in | Wt 134.0 lb

## 2022-08-17 DIAGNOSIS — E876 Hypokalemia: Secondary | ICD-10-CM

## 2022-08-17 DIAGNOSIS — Z7189 Other specified counseling: Secondary | ICD-10-CM

## 2022-08-17 DIAGNOSIS — C20 Malignant neoplasm of rectum: Secondary | ICD-10-CM | POA: Diagnosis not present

## 2022-08-17 LAB — COMPREHENSIVE METABOLIC PANEL
ALT: 12 U/L (ref 0–44)
AST: 21 U/L (ref 15–41)
Albumin: 4 g/dL (ref 3.5–5.0)
Alkaline Phosphatase: 84 U/L (ref 38–126)
Anion gap: 8 (ref 5–15)
BUN: 16 mg/dL (ref 6–20)
CO2: 26 mmol/L (ref 22–32)
Calcium: 8.9 mg/dL (ref 8.9–10.3)
Chloride: 104 mmol/L (ref 98–111)
Creatinine, Ser: 0.97 mg/dL (ref 0.44–1.00)
GFR, Estimated: >60 mL/min (ref >60)
Glucose, Bld: 170 mg/dL — ABNORMAL HIGH (ref 70–99)
Potassium: 3.3 mmol/L — ABNORMAL LOW (ref 3.5–5.1)
Sodium: 138 mmol/L (ref 135–145)
Total Bilirubin: 0.5 mg/dL (ref 0.3–1.2)
Total Protein: 6.9 g/dL (ref 6.5–8.1)

## 2022-08-17 LAB — CBC WITH DIFFERENTIAL/PLATELET
Abs Immature Granulocytes: 0.03 10*3/uL (ref 0.00–0.07)
Basophils Absolute: 0 10*3/uL (ref 0.0–0.1)
Basophils Relative: 0 %
Eosinophils Absolute: 0.2 10*3/uL (ref 0.0–0.5)
Eosinophils Relative: 3 %
HCT: 40.5 % (ref 36.0–46.0)
Hemoglobin: 13.7 g/dL (ref 12.0–15.0)
Immature Granulocytes: 0 %
Lymphocytes Relative: 11 %
Lymphs Abs: 0.9 10*3/uL (ref 0.7–4.0)
MCH: 32.8 pg (ref 26.0–34.0)
MCHC: 33.8 g/dL (ref 30.0–36.0)
MCV: 96.9 fL (ref 80.0–100.0)
Monocytes Absolute: 0.6 10*3/uL (ref 0.1–1.0)
Monocytes Relative: 7 %
Neutro Abs: 6.3 10*3/uL (ref 1.7–7.7)
Neutrophils Relative %: 79 %
Platelets: 108 10*3/uL — ABNORMAL LOW (ref 150–400)
RBC: 4.18 MIL/uL (ref 3.87–5.11)
RDW: 18.5 % — ABNORMAL HIGH (ref 11.5–15.5)
WBC: 8 10*3/uL (ref 4.0–10.5)
nRBC: 0 % (ref 0.0–0.2)

## 2022-08-17 LAB — RAD ONC ARIA SESSION SUMMARY
Course Elapsed Days: 21
Plan Fractions Treated to Date: 13
Plan Prescribed Dose Per Fraction: 1.8 Gy
Plan Total Fractions Prescribed: 25
Plan Total Prescribed Dose: 45 Gy
Reference Point Dosage Given to Date: 23.4 Gy
Reference Point Session Dosage Given: 1.8 Gy
Session Number: 13

## 2022-08-17 MED ORDER — POTASSIUM CHLORIDE 20 MEQ/100ML IV SOLN
20.0000 meq | Freq: Once | INTRAVENOUS | Status: AC
  Administered 2022-08-17: 20 meq via INTRAVENOUS

## 2022-08-17 MED ORDER — SODIUM CHLORIDE 0.9% FLUSH
10.0000 mL | Freq: Once | INTRAVENOUS | Status: AC | PRN
  Administered 2022-08-17: 10 mL
  Filled 2022-08-17: qty 10

## 2022-08-17 MED ORDER — SODIUM CHLORIDE 0.9 % IV SOLN
Freq: Once | INTRAVENOUS | Status: AC
  Filled 2022-08-17: qty 250

## 2022-08-17 MED ORDER — HEPARIN SOD (PORK) LOCK FLUSH 100 UNIT/ML IV SOLN
500.0000 [IU] | Freq: Once | INTRAVENOUS | Status: AC | PRN
  Administered 2022-08-17: 500 [IU]
  Filled 2022-08-17: qty 5

## 2022-08-17 NOTE — Progress Notes (Signed)
Pt receiving IVF's with potassium. Feels fatigued. Discharged at completion. Going to radiation appt at 1 pm.

## 2022-08-17 NOTE — Progress Notes (Signed)
Hematology/Oncology Progress note [phone] [phone]            Patient Care Team: Steele Sizer, MD as PCP - General (Family Medicine) Sharmaine Base, MD as Referring Physician (Psychiatry) Bjorn Loser, MD as Consulting Physician (Urology) Germaine Pomfret, [facility] (Pharmacist) Clent Jacks, RN as Oncology Nurse Navigator [name], MD as Consulting Physician (Oncology)   ASSESSMENT & PLAN:   Cancer Staging  Rectal cancer [facility]) Staging form: Colon and Rectum, AJCC 8th Edition - Pathologic stage from 05/03/2022: Stage IIIB (pT3, pN1b, cM0) - Signed by [name], MD on 05/03/2022   No problem-specific Assessment & Plan notes found for this encounter.  No orders of the defined types were placed in this encounter.  Reviewed her labs.  1L IVF today plus 20 MEQ K 1 weeks lab MD IV fluid  All questions were answered. The patient knows to call the clinic with any problems, questions or concerns.  Nelwyn Salisbury PA-C [facility] 08/17/2022      CHIEF COMPLAINTS/REASON FOR VISIT:  Follow-up for rectal cancer treatments.  HISTORY OF PRESENTING ILLNESS:   April Luna is a  51 y.o.  female presents for treatment of rectal cancer Oncology History  Rectal cancer ([place])  02/26/2022 Imaging   MRI PELVIS WITHOUT CONTRAST- By imaging, rectal cancer stage:  T1/T2, N0, Mx    03/02/2022 Imaging   CT CHEST AND ABDOMEN WITH CONTRAST 1. No convincing evidence of metastatic disease within the chest or abdomen. 2. Atrophic left kidney with multifocal renal scarring and cortical calcifications as well as nonobstructive renal stones measuring up to 5 mm. 3. Prominent left-sided predominant retroperitoneal lymph nodes measuring up to 8 mm near the level of the renal hilum, overall decreased in size dating back to CT September 19, 2018 and favored reactive related to left renal inflammation. 4.  Aortic Atherosclerosis  (ICD10-I70.0).   03/27/2022 Genetic Testing    [facility] Custom[employer]+RNA cancer panel found no pathogenic mutations.    04/21/2022 Initial Diagnosis   Rectal cancer - baseline CEA 3.6 -02/09/2022, patient had colonoscopy which showed renal mass 10 cm from anal verge.  5 mm polyp in ascending colon.  Removed and retrieved. Pathology showed rectal adenocarcinoma.  The polyp in the ascending colon is a tubular adenoma. -04/21/2022, patient underwent robotic assisted ultralow anterior resection. Pathology showed moderately differentiated adenocarcinoma, 4.5 cm in maximal extent, with focal extension through muscularis propria into perirectal soft tissue.  3 lymph nodes positive for metastatic carcinoma.  Negative margin.  pT3 pN1b, MSI stable.   05/03/2022 Cancer Staging   Staging form: Colon and Rectum, AJCC 8th Edition - Pathologic stage from 05/03/2022: Stage IIIB (pT3, pN1b, cM0) - Signed by [name], MD on 05/03/2022 Stage prefix: Initial diagnosis   05/11/2022 Miscellaneous   Medi port placed by Dr.White   05/26/2022 -  Chemotherapy   FOLFOX Q2 weeks x 4   05/26/2022 -  Chemotherapy   Patient is on Treatment Plan : COLORECTAL FOLFOX q14d x 8 cycles     07/27/2022 -  Chemotherapy   Concurrent chemotherapy- Xeloda  1334m BID and radiation.    Patient has bipolar and schizophrenia..  Patient is married and lives at home with her husband and son.  Patient tolerates Xeloda chemotherapy and radiation She reports that overall she is feeling much better. No nausea, vomiting.  No abdominal pain.  No fever or chills.  Review of Systems  Constitutional:  Negative for appetite change, chills, fatigue and  fever.  HENT:   Negative for hearing loss and voice change.   Eyes:  Negative for eye problems.  Respiratory:  Negative for chest tightness and cough.   Cardiovascular:  Negative for chest pain.  Gastrointestinal:  Negative for abdominal distention, abdominal pain and blood in stool.  Endocrine: Negative  for hot flashes.  Genitourinary:  Negative for difficulty urinating and frequency.   Musculoskeletal:  Negative for arthralgias.  Skin:  Negative for itching and rash.  Neurological:  Negative for extremity weakness.  Hematological:  Negative for adenopathy.  Psychiatric/Behavioral:  Negative for confusion.     MEDICAL HISTORY:  Past Medical History:  Diagnosis Date   Allergy    pollen   Anxiety    Arthritis    right hip   Bipolar 1 disorder (HCC)    Cancer (HCC)    rectal   Chronic kidney disease    COPD (chronic obstructive pulmonary disease) (HCC)    Depression    Family history of adverse reaction to anesthesia    grand father had a stroke during anesthesia   Family history of breast cancer    Family history of colon cancer    Family history of uterine cancer    GERD (gastroesophageal reflux disease)    History of kidney stones    Hyperlipidemia    Hypertension    Hypothyroidism    Panic attack    Pneumonia    Psoriasis    Sleep apnea 08/11/2021   No CPAP   Type 2 diabetes mellitus with microalbuminuria, without long-term current use of insulin ([place]) 06/24/2019    SURGICAL HISTORY: Past Surgical History:  Procedure Laterality Date   BREAST BIOPSY Left 12/14/2021   [place] bx, venus marker, path pending   CESAREAN SECTION     COLONOSCOPY WITH PROPOFOL N/A 02/09/2022   Procedure: COLONOSCOPY WITH PROPOFOL;  Surgeon: Lin Landsman, MD;  Location: [place];  Service: Endoscopy;  Laterality: N/A;  sleep apnea   CYSTOSCOPY W/ RETROGRADES Left 11/08/2018   Procedure: CYSTOSCOPY WITH RETROGRADE PYELOGRAM;  Surgeon: Billey Co, MD;  Location: [facility];  Service: Urology;  Laterality: Left;   CYSTOSCOPY/URETEROSCOPY/HOLMIUM LASER/STENT PLACEMENT Left 11/08/2018   Procedure: CYSTOSCOPY/URETEROSCOPY/HOLMIUM LASER/STENT PLACEMENT;  Surgeon: Billey Co, MD;  Location: [facility];  Service: Urology;  Laterality: Left;   IR IMAGING GUIDED PORT INSERTION   05/11/2022   MOUTH SURGERY     wisdom teeth extraction   MOUTH SURGERY     teeth removal   POLYPECTOMY N/A 02/09/2022   Procedure: POLYPECTOMY;  Surgeon: Lin Landsman, MD;  Location: [place];  Service: Endoscopy;  Laterality: N/A;   XI ROBOTIC ASSISTED LOWER ANTERIOR RESECTION N/A 04/21/2022   Procedure: XI ROBOTIC ASSISTED LOWER ANTERIOR RESECTION WITH COLOSTOMY, BILATERAL TAP BLOCK, ASSESSMENT OF TISSUE PERFUSSION WITH FIREFLY INJECTION;  Surgeon: Ileana Roup, MD;  Location: [facility];  Service: General;  Laterality: N/A;    SOCIAL HISTORY: Social History   Socioeconomic History   Marital status: Married    Spouse name: Montine Circle   Number of children: 1   Years of education: 12   Highest education level: High school graduate  Occupational History   Occupation: unemployed    Comment: disabled  Tobacco Use   Smoking status: Every Day    Packs/day: 0.25    Years: 34.00    Total pack years: 8.50    Types: Cigarettes    Start date: 05/13/1985   Smokeless tobacco: Never  Tobacco comments:    1 PPD 04/20/223  Vaping Use   Vaping Use: Former   Start date: 05/25/2018   Quit date: 09/24/2018  Substance and Sexual Activity   Alcohol use: Not Currently    Alcohol/week: 4.0 standard drinks of alcohol    Types: 4 Glasses of wine per week    Comment: quit ETOH in Feb. 2023   Drug use: Not Currently    Types: Marijuana    Comment: 2 days ago   Sexual activity: Not Currently    Birth control/protection: Post-menopausal  Other Topics Concern   Not on file  Social History Narrative   Lives with husband and son    Social Determinants of Health   Financial Resource Strain: Medium Risk (12/02/2021)   Overall Financial Resource Strain (CARDIA)    Difficulty of Paying Living Expenses: Somewhat hard  Food Insecurity: No Food Insecurity (12/02/2021)   Hunger Vital Sign    Worried About Running Out of Food in the Last Year: Never true    Ran Out of Food in  the Last Year: Never true  Transportation Needs: Unmet Transportation Needs (08/09/2022)   PRAPARE - Transportation    Lack of Transportation (Medical): Yes    Lack of Transportation (Non-Medical): Yes  Physical Activity: Inactive (12/02/2021)   Exercise Vital Sign    Days of Exercise per Week: 0 days    Minutes of Exercise per Session: 0 min  Stress: No Stress Concern Present (12/02/2021)   [place] of Stress : Not at all  Social Connections: Moderately Integrated (12/02/2021)   Social Connection and Isolation Panel [NHANES]    Frequency of Communication with Friends and Family: More than three times a week    Frequency of Social Gatherings with Friends and Family: Twice a week    Attends Religious Services: More than 4 times per year    Active Member of [employer] or Organizations: No    Attends Archivist Meetings: Never    Marital Status: Married  Human resources officer Violence: Not At Risk (07/07/2021)   Humiliation, Afraid, Rape, and Kick questionnaire    Fear of Current or Ex-Partner: No    Emotionally Abused: No    Physically Abused: No    Sexually Abused: No    FAMILY HISTORY: Family History  Problem Relation Age of Onset   Depression Mother    Anxiety disorder Mother    Diabetes Mother    Hypertension Mother    Hyperlipidemia Mother    Cancer Mother    Uterine cancer Mother 20   Cervical cancer Mother 41   Colon cancer Father    Depression Brother    Anxiety disorder Brother    Cancer Maternal Aunt        unk types   Diabetes Mellitus II Maternal Grandmother    Hypercholesterolemia Maternal Grandmother    Breast cancer Maternal Grandmother    Cancer Paternal Grandmother    Diabetes Paternal Grandmother    Melanoma Paternal Grandmother    Stomach cancer Paternal Grandmother     ALLERGIES:  is allergic to metformin and related, nsaids, perphenazine, sulfa antibiotics, abilify  [aripiprazole], and penicillins.  MEDICATIONS:  Current Outpatient Medications  Medication Sig Dispense Refill   acetaminophen (TYLENOL) 325 MG tablet Take 650 mg by mouth every 6 (six) hours as needed for headache (pain).     albuterol (VENTOLIN HFA) 108 (90 Base) MCG/ACT inhaler Inhale 2 puffs into the  lungs every 6 (six) hours as needed for wheezing or shortness of breath. 1 each 5   amantadine (SYMMETREL) 100 MG capsule Take 100 mg by mouth 2 (two) times daily.     blood glucose meter kit and supplies Dispense based on patient and insurance preference. Use up to four times daily as directed. (FOR ICD-10 E10.9, E11.9). 1 each 0   Budeson-Glycopyrrol-Formoterol (BREZTRI AEROSPHERE) 160-9-4.8 MCG/ACT AERO Inhale 2 puffs into the lungs in the morning and at bedtime. 5.9 g 11   buPROPion (WELLBUTRIN XL) 150 MG 24 hr tablet Take 150 mg by mouth every morning.     capecitabine (XELODA) 150 MG tablet Take 300 mg by mouth 2 (two) times daily after a meal. Take along with 585m tablets. Take Monday- Friday. Take only on days of radiation.     capecitabine (XELODA) 500 MG tablet Take 2 tablets (1,000 mg total) by mouth 2 (two) times daily after a meal. Take along with 1576mtablets. Take Monday- Friday. Take only on days of radiation. 112 tablet 0   Cholecalciferol (VITAMIN D-3) 125 MCG (5000 UT) TABS Take 5,000 Units by mouth daily. 30 tablet 1   FARXIGA 10 MG TABS tablet TAKE 1 TABLET BY MOUTH DAILY BEFORE BREAKFAST. 30 tablet 2   gabapentin (NEURONTIN) 300 MG capsule Take 300 mg by mouth 2 (two) times daily.     hydrocortisone cream 0.5 % Apply 1 Application topically 2 (two) times daily as needed for itching. 30 g 1   Hydrocortisone, Perianal, 1 % CREA Apply topically.     hydrOXYzine (VISTARIL) 50 MG capsule Take 50 mg by mouth 3 (three) times daily as needed for anxiety.     icosapent Ethyl (VASCEPA) 1 g capsule TAKE 2 CAPSULES BY MOUTH 2 TIMES DAILY 120 capsule 3   levothyroxine (SYNTHROID) 75  MCG tablet TAKE 1 TABLET BY MOUTH DAILY BEFORE BREAKFAST 90 tablet 0   lidocaine-prilocaine (EMLA) cream Apply to affected area once 30 g 3   loratadine (CLARITIN) 10 MG tablet Take 10 mg by mouth every morning.     losartan (COZAAR) 25 MG tablet Take 1 tablet (25 mg total) by mouth at bedtime. 30 tablet 3   NON FORMULARY Pt uses a cpap nightly     nystatin (MYCOSTATIN) 100000 UNIT/ML suspension Take 5 mLs (500,000 Units total) by mouth 4 (four) times daily. Swish and spit 60 mL 0   ondansetron (ZOFRAN) 8 MG tablet Take 1 tablet (8 mg total) by mouth 2 (two) times daily as needed for refractory nausea / vomiting. Start on day 3 after chemotherapy. 30 tablet 1   paliperidone (INVEGA SUSTENNA) 234 MG/1.5ML SUSY injection Inject 234 mg into the muscle every 30 (thirty) days. On or about the 14th of each month     pantoprazole (PROTONIX) 40 MG tablet TAKE 1 TABLET BY MOUTH DAILY 90 tablet 0   Pediatric Multivit-Minerals-C (CHEWABLES MULTIVITAMIN PO) Take 1 tablet by mouth daily.     pioglitazone (ACTOS) 15 MG tablet Take 1 tablet (15 mg total) by mouth daily. 30 tablet 3   prochlorperazine (COMPAZINE) 10 MG tablet Take 1 tablet (10 mg total) by mouth every 6 (six) hours as needed for nausea or vomiting. 60 tablet 3   rosuvastatin (CRESTOR) 40 MG tablet Take 1 tablet (40 mg total) by mouth every morning. 30 tablet 3   sertraline (ZOLOFT) 100 MG tablet Take 200 mg by mouth every morning.     Spacer/Aero-Holding Chambers (AEROCHAMBER MV) inhaler Use as instructed  1 each 0   docusate sodium (COLACE) 100 MG capsule Take 1 capsule (100 mg total) by mouth 2 (two) times daily. (Patient not taking: Reported on 08/09/2022) 60 capsule 1   No current facility-administered medications for this visit.   Facility-Administered Medications Ordered in Other Visits  Medication Dose Route Frequency Provider Last Rate Last Admin   heparin lock flush 100 unit/mL  500 Units Intracatheter Once PRN Verlon Au, NP        potassium chloride 20 mEq in 100 mL IVPB  20 mEq Intravenous Once Hughie Closs, PA-C 100 mL/hr at 08/17/22 1037 20 mEq at 08/17/22 1037     PHYSICAL EXAMINATION: ECOG PERFORMANCE STATUS: 0 - Asymptomatic Vitals:   08/17/22 1002  BP: 117/78  Pulse: (!) 110  Temp: (!) 96.6 F (35.9 C)    Filed Weights   08/17/22 1002  Weight: 134 lb (60.8 kg)     Physical Exam Constitutional:      General: She is not in acute distress. HENT:     Head: Normocephalic and atraumatic.  Eyes:     General: No scleral icterus. Cardiovascular:     Rate and Rhythm: Normal rate and regular rhythm.     Heart sounds: Normal heart sounds.  Pulmonary:     Effort: Pulmonary effort is normal. No respiratory distress.     Breath sounds: No wheezing.  Abdominal:     General: Bowel sounds are normal. There is no distension.     Palpations: Abdomen is soft.     Comments: Ostomy   Musculoskeletal:        General: No deformity. Normal range of motion.     Cervical back: Normal range of motion and neck supple.  Skin:    General: Skin is warm and dry.     Findings: No erythema or rash.  Neurological:     Mental Status: She is alert and oriented to person, place, and time. Mental status is at baseline.     Cranial Nerves: No cranial nerve deficit.     Coordination: Coordination normal.  Psychiatric:        Mood and Affect: Mood normal.     LABORATORY DATA:  I have reviewed the data as listed    Latest Ref Rng & Units 08/17/2022    9:45 AM 08/09/2022    9:33 AM 08/01/2022    9:39 AM  CBC  WBC 4.0 - 10.5 K/uL 8.0  6.7  8.1   Hemoglobin 12.0 - 15.0 g/dL 13.7  14.6  15.5   Hematocrit 36.0 - 46.0 % 40.5  43.7  45.6   Platelets 150 - 400 K/uL 108  94  144       Latest Ref Rng & Units 08/17/2022    9:45 AM 08/09/2022    9:33 AM 08/04/2022    8:15 AM  CMP  Glucose 70 - 99 mg/dL 170  130  167   BUN 6 - 20 mg/dL _0 Creatinine 0.44 - 1.00 mg/dL 0.97  1.03  1.05   Sodium 135 - 145 mmol/L  138  135  137   Potassium 3.5 - 5.1 mmol/L 3.3  3.9  3.7   Chloride 98 - 111 mmol/L 104  100  100   CO2 22 - 32 mmol/L _1 Calcium 8.9 - 10.3 mg/dL 8.9  9.2  9.2   Total Protein 6.5 - 8.1 g/dL 6.9  7.6  Total Bilirubin 0.3 - 1.2 mg/dL 0.5  0.6    Alkaline Phos 38 - 126 U/L 84  82    AST 15 - 41 U/L 21  17    ALT 0 - 44 U/L 12  15          RADIOGRAPHIC STUDIES: I have personally reviewed the radiological images as listed and agreed with the findings in the report. No results found.

## 2022-08-18 ENCOUNTER — Inpatient Hospital Stay: Payer: Medicare Other

## 2022-08-18 ENCOUNTER — Ambulatory Visit
Admission: RE | Admit: 2022-08-18 | Discharge: 2022-08-18 | Disposition: A | Discharge status: Home / Self Care | Payer: Medicare Other | Source: Ambulatory Visit | Attending: Radiation Oncology | Admitting: Radiation Oncology

## 2022-08-18 ENCOUNTER — Other Ambulatory Visit: Payer: Self-pay

## 2022-08-18 DIAGNOSIS — C20 Malignant neoplasm of rectum: Secondary | ICD-10-CM | POA: Diagnosis not present

## 2022-08-18 LAB — RAD ONC ARIA SESSION SUMMARY
Course Elapsed Days: 22
Plan Fractions Treated to Date: 14
Plan Prescribed Dose Per Fraction: 1.8 Gy
Plan Total Fractions Prescribed: 25
Plan Total Prescribed Dose: 45 Gy
Reference Point Dosage Given to Date: 25.2 Gy
Reference Point Session Dosage Given: 1.8 Gy
Session Number: 14

## 2022-08-20 ENCOUNTER — Ambulatory Visit: Payer: Medicare Other

## 2022-08-21 ENCOUNTER — Ambulatory Visit: Payer: Medicare Other

## 2022-08-21 ENCOUNTER — Inpatient Hospital Stay: Payer: Medicare Other

## 2022-08-22 ENCOUNTER — Ambulatory Visit
Admission: RE | Admit: 2022-08-22 | Discharge: 2022-08-22 | Disposition: A | Discharge status: Home / Self Care | Payer: Medicare Other | Source: Ambulatory Visit | Attending: Radiation Oncology | Admitting: Radiation Oncology

## 2022-08-22 ENCOUNTER — Inpatient Hospital Stay: Payer: Medicare Other

## 2022-08-22 ENCOUNTER — Other Ambulatory Visit: Payer: Self-pay

## 2022-08-22 DIAGNOSIS — C20 Malignant neoplasm of rectum: Secondary | ICD-10-CM | POA: Diagnosis not present

## 2022-08-22 LAB — RAD ONC ARIA SESSION SUMMARY
Course Elapsed Days: 26
Plan Fractions Treated to Date: 15
Plan Prescribed Dose Per Fraction: 1.8 Gy
Plan Total Fractions Prescribed: 25
Plan Total Prescribed Dose: 45 Gy
Reference Point Dosage Given to Date: 27 Gy
Reference Point Session Dosage Given: 1.8 Gy
Session Number: 15

## 2022-08-23 ENCOUNTER — Other Ambulatory Visit: Payer: Self-pay

## 2022-08-23 ENCOUNTER — Ambulatory Visit
Admission: RE | Admit: 2022-08-23 | Discharge: 2022-08-23 | Disposition: A | Discharge status: Home / Self Care | Payer: Medicare Other | Source: Ambulatory Visit | Attending: Radiation Oncology | Admitting: Radiation Oncology

## 2022-08-23 ENCOUNTER — Inpatient Hospital Stay: Payer: Medicare Other

## 2022-08-23 DIAGNOSIS — C20 Malignant neoplasm of rectum: Secondary | ICD-10-CM | POA: Diagnosis not present

## 2022-08-23 LAB — RAD ONC ARIA SESSION SUMMARY
Course Elapsed Days: 27
Plan Fractions Treated to Date: 16
Plan Prescribed Dose Per Fraction: 1.8 Gy
Plan Total Fractions Prescribed: 25
Plan Total Prescribed Dose: 45 Gy
Reference Point Dosage Given to Date: 28.8 Gy
Reference Point Session Dosage Given: 1.8 Gy
Session Number: 16

## 2022-08-24 ENCOUNTER — Encounter: Payer: Self-pay | Admitting: Oncology

## 2022-08-24 ENCOUNTER — Inpatient Hospital Stay: Payer: Medicare Other

## 2022-08-24 ENCOUNTER — Ambulatory Visit
Admission: RE | Admit: 2022-08-24 | Discharge: 2022-08-24 | Disposition: A | Discharge status: Home / Self Care | Payer: Medicare Other | Source: Ambulatory Visit | Attending: Radiation Oncology | Admitting: Radiation Oncology

## 2022-08-24 ENCOUNTER — Other Ambulatory Visit: Payer: Self-pay

## 2022-08-24 ENCOUNTER — Inpatient Hospital Stay (HOSPITAL_BASED_OUTPATIENT_CLINIC_OR_DEPARTMENT_OTHER): Payer: Medicare Other | Admitting: Oncology

## 2022-08-24 VITALS — BP 108/75 | HR 93 | Resp 16 | Wt 132.6 lb

## 2022-08-24 DIAGNOSIS — C20 Malignant neoplasm of rectum: Secondary | ICD-10-CM

## 2022-08-24 DIAGNOSIS — Z5111 Encounter for antineoplastic chemotherapy: Secondary | ICD-10-CM

## 2022-08-24 DIAGNOSIS — D696 Thrombocytopenia, unspecified: Secondary | ICD-10-CM | POA: Diagnosis not present

## 2022-08-24 DIAGNOSIS — Z720 Tobacco use: Secondary | ICD-10-CM | POA: Diagnosis not present

## 2022-08-24 DIAGNOSIS — Z7189 Other specified counseling: Secondary | ICD-10-CM

## 2022-08-24 LAB — COMPREHENSIVE METABOLIC PANEL
ALT: 11 U/L (ref 0–44)
AST: 17 U/L (ref 15–41)
Albumin: 3.9 g/dL (ref 3.5–5.0)
Alkaline Phosphatase: 78 U/L (ref 38–126)
Anion gap: 8 (ref 5–15)
BUN: 11 mg/dL (ref 6–20)
CO2: 26 mmol/L (ref 22–32)
Calcium: 9.1 mg/dL (ref 8.9–10.3)
Chloride: 102 mmol/L (ref 98–111)
Creatinine, Ser: 0.85 mg/dL (ref 0.44–1.00)
GFR, Estimated: >60 mL/min (ref >60)
Glucose, Bld: 118 mg/dL — ABNORMAL HIGH (ref 70–99)
Potassium: 3.6 mmol/L (ref 3.5–5.1)
Sodium: 136 mmol/L (ref 135–145)
Total Bilirubin: 0.4 mg/dL (ref 0.3–1.2)
Total Protein: 7.1 g/dL (ref 6.5–8.1)

## 2022-08-24 LAB — CBC WITH DIFFERENTIAL/PLATELET
Abs Immature Granulocytes: 0.02 10*3/uL (ref 0.00–0.07)
Basophils Absolute: 0.1 10*3/uL (ref 0.0–0.1)
Basophils Relative: 1 %
Eosinophils Absolute: 0.3 10*3/uL (ref 0.0–0.5)
Eosinophils Relative: 4 %
HCT: 39.7 % (ref 36.0–46.0)
Hemoglobin: 13.4 g/dL (ref 12.0–15.0)
Immature Granulocytes: 0 %
Lymphocytes Relative: 10 %
Lymphs Abs: 0.7 10*3/uL (ref 0.7–4.0)
MCH: 33.3 pg (ref 26.0–34.0)
MCHC: 33.8 g/dL (ref 30.0–36.0)
MCV: 98.5 fL (ref 80.0–100.0)
Monocytes Absolute: 0.6 10*3/uL (ref 0.1–1.0)
Monocytes Relative: 8 %
Neutro Abs: 5.8 10*3/uL (ref 1.7–7.7)
Neutrophils Relative %: 77 %
Platelets: 110 10*3/uL — ABNORMAL LOW (ref 150–400)
RBC: 4.03 MIL/uL (ref 3.87–5.11)
RDW: 18.9 % — ABNORMAL HIGH (ref 11.5–15.5)
WBC: 7.5 10*3/uL (ref 4.0–10.5)
nRBC: 0 % (ref 0.0–0.2)

## 2022-08-24 LAB — RAD ONC ARIA SESSION SUMMARY
Course Elapsed Days: 28
Plan Fractions Treated to Date: 17
Plan Prescribed Dose Per Fraction: 1.8 Gy
Plan Total Fractions Prescribed: 25
Plan Total Prescribed Dose: 45 Gy
Reference Point Dosage Given to Date: 30.6 Gy
Reference Point Session Dosage Given: 1.8 Gy
Session Number: 17

## 2022-08-24 MED ORDER — HEPARIN SOD (PORK) LOCK FLUSH 100 UNIT/ML IV SOLN
500.0000 [IU] | Freq: Once | INTRAVENOUS | Status: AC | PRN
  Administered 2022-08-24: 500 [IU]
  Filled 2022-08-24: qty 5

## 2022-08-24 MED ORDER — SODIUM CHLORIDE 0.9 % IV SOLN
Freq: Once | INTRAVENOUS | Status: AC
  Filled 2022-08-24: qty 250

## 2022-08-24 MED ORDER — SODIUM CHLORIDE 0.9% FLUSH
10.0000 mL | Freq: Once | INTRAVENOUS | Status: AC | PRN
  Administered 2022-08-24: 10 mL
  Filled 2022-08-24: qty 10

## 2022-08-24 NOTE — Assessment & Plan Note (Signed)
Smoke cessation recommended 

## 2022-08-24 NOTE — Progress Notes (Signed)
Hematology/Oncology Progress note [phone] [phone]            Patient Care Team: Steele Sizer, MD as PCP - General (Family Medicine) Sharmaine Base, MD as Referring Physician (Psychiatry) Bjorn Loser, MD as Consulting Physician (Urology) Germaine Pomfret, [facility] (Pharmacist) Clent Jacks, RN as Oncology Nurse Navigator [name], MD as Consulting Physician (Oncology)   ASSESSMENT & PLAN:   Cancer Staging  Rectal cancer [facility]) Staging form: Colon and Rectum, AJCC 8th Edition - Pathologic stage from 05/03/2022: Stage IIIB (pT3, pN1b, cM0) - Signed by [name], MD on 05/03/2022   Rectal cancer ([place]) Stage III. pT3 pN1b cM0, s/p APR. Currently on adjuvant chemotherapy. S/p FOLFOX x 4 cycles Labs reviewed and discussed with patient Continue Xeloda 1300 mg twice daily.  Overall she tolerates well. Proceed with IV fluid normal saline 1 L x 1.   Encounter for antineoplastic chemotherapy Chemotherapy plan as listed above  Thrombocytopenia (HCC) Trending low, close monitor.  Tobacco use Smoke cessation recommended.   Orders Placed This Encounter  Procedures   CBC with Differential/Platelet    Standing Status:   Future    Standing Expiration Date:   08/25/2023   Comprehensive metabolic panel    Standing Status:   Future    Standing Expiration Date:   08/24/2023    Follow-up in 1 week IV fluid 2 weeks lab MD IV fluid  All questions were answered. The patient knows to call the clinic with any problems, questions or concerns.  [name], MD, PhD [facility] Hematology Oncology 08/24/2022      CHIEF COMPLAINTS/REASON FOR VISIT:  Follow-up for rectal cancer treatments.  HISTORY OF PRESENTING ILLNESS:   April Luna is a  51 y.o.  female presents for treatment of rectal cancer Oncology History  Rectal cancer ([place])  02/26/2022 Imaging   MRI PELVIS WITHOUT CONTRAST- By imaging, rectal cancer stage:  T1/T2, N0, Mx     03/02/2022 Imaging   CT CHEST AND ABDOMEN WITH CONTRAST 1. No convincing evidence of metastatic disease within the chest or abdomen. 2. Atrophic left kidney with multifocal renal scarring and cortical calcifications as well as nonobstructive renal stones measuring up to 5 mm. 3. Prominent left-sided predominant retroperitoneal lymph nodes measuring up to 8 mm near the level of the renal hilum, overall decreased in size dating back to CT September 19, 2018 and favored reactive related to left renal inflammation. 4.  Aortic Atherosclerosis (ICD10-I70.0).   03/27/2022 Genetic Testing    [facility] Custom[employer]+RNA cancer panel found no pathogenic mutations.    04/21/2022 Initial Diagnosis   Rectal cancer - baseline CEA 3.6 -02/09/2022, patient had colonoscopy which showed renal mass 10 cm from anal verge.  5 mm polyp in ascending colon.  Removed and retrieved. Pathology showed rectal adenocarcinoma.  The polyp in the ascending colon is a tubular adenoma. -04/21/2022, patient underwent robotic assisted ultralow anterior resection. Pathology showed moderately differentiated adenocarcinoma, 4.5 cm in maximal extent, with focal extension through muscularis propria into perirectal soft tissue.  3 lymph nodes positive for metastatic carcinoma.  Negative margin.  pT3 pN1b, MSI stable.   05/03/2022 Cancer Staging   Staging form: Colon and Rectum, AJCC 8th Edition - Pathologic stage from 05/03/2022: Stage IIIB (pT3, pN1b, cM0) - Signed by [name], MD on 05/03/2022 Stage prefix: Initial diagnosis   05/11/2022 Miscellaneous   Medi port placed by Dr.White   05/26/2022 -  Chemotherapy   FOLFOX Q2 weeks x 4   05/26/2022 -  Chemotherapy   Patient is on Treatment Plan : COLORECTAL FOLFOX q14d x 8 cycles     07/27/2022 -  Chemotherapy   Concurrent chemotherapy- Xeloda  1373m BID and radiation.    Patient has bipolar and schizophrenia..  Patient is married and lives at home with her husband and son.  Patient tolerates  Xeloda chemotherapy and radiation + Constipation, patient takes Colace twice daily.  No significant increase of ostomy output. + Bloating, patient is concerned about not having enough ostomy output. No nausea vomiting no abdominal pain.  No fever or chills.  Review of Systems  Constitutional:  Negative for appetite change, chills, fatigue and fever.  HENT:   Negative for hearing loss and voice change.   Eyes:  Negative for eye problems.  Respiratory:  Negative for chest tightness and cough.   Cardiovascular:  Negative for chest pain.  Gastrointestinal:  Negative for abdominal distention, abdominal pain and blood in stool.       Bloated  Endocrine: Negative for hot flashes.  Genitourinary:  Negative for difficulty urinating and frequency.   Musculoskeletal:  Negative for arthralgias.  Skin:  Negative for itching and rash.  Neurological:  Negative for extremity weakness.  Hematological:  Negative for adenopathy.  Psychiatric/Behavioral:  Negative for confusion.     MEDICAL HISTORY:  Past Medical History:  Diagnosis Date   Allergy    pollen   Anxiety    Arthritis    right hip   Bipolar 1 disorder (HCC)    Cancer (HCC)    rectal   Chronic kidney disease    COPD (chronic obstructive pulmonary disease) (HCC)    Depression    Family history of adverse reaction to anesthesia    grand father had a stroke during anesthesia   Family history of breast cancer    Family history of colon cancer    Family history of uterine cancer    GERD (gastroesophageal reflux disease)    History of kidney stones    Hyperlipidemia    Hypertension    Hypothyroidism    Panic attack    Pneumonia    Psoriasis    Sleep apnea 08/11/2021   No CPAP   Type 2 diabetes mellitus with microalbuminuria, without long-term current use of insulin ([place] 06/24/2019    SURGICAL HISTORY: Past Surgical History:  Procedure Laterality Date   BREAST BIOPSY Left 12/14/2021   uKoreabx, venus marker, path pending    CESAREAN SECTION     COLONOSCOPY WITH PROPOFOL N/A 02/09/2022   Procedure: COLONOSCOPY WITH PROPOFOL;  Surgeon: VLin Landsman MD;  Location: [facility]  Service: Endoscopy;  Laterality: N/A;  sleep apnea   CYSTOSCOPY W/ RETROGRADES Left 11/08/2018   Procedure: CYSTOSCOPY WITH RETROGRADE PYELOGRAM;  Surgeon: SBilley Co MD;  Location: [facility];  Service: Urology;  Laterality: Left;   CYSTOSCOPY/URETEROSCOPY/HOLMIUM LASER/STENT PLACEMENT Left 11/08/2018   Procedure: CYSTOSCOPY/URETEROSCOPY/HOLMIUM LASER/STENT PLACEMENT;  Surgeon: SBilley Co MD;  Location: [facility];  Service: Urology;  Laterality: Left;   IR IMAGING GUIDED PORT INSERTION  05/11/2022   MOUTH SURGERY     wisdom teeth extraction   MOUTH SURGERY     teeth removal   POLYPECTOMY N/A 02/09/2022   Procedure: POLYPECTOMY;  Surgeon: VLin Landsman MD;  [place] [facility]  Service: Endoscopy;  Laterality: N/A;   XI ROBOTIC ASSISTED LOWER ANTERIOR RESECTION N/A 04/21/2022   Procedure: XI ROBOTIC ASSISTED LOWER ANTERIOR RESECTION WITH COLOSTOMY, BILATERAL TAP BLOCK, ASSESSMENT OF TISSUE PERFUSSION  WITH FIREFLY INJECTION;  Surgeon: Ileana Roup, MD;  Location: [facility];  Service: General;  Laterality: N/A;    SOCIAL HISTORY: Social History   Socioeconomic History   Marital status: Married    Spouse name: Montine Circle   Number of children: 1   Years of education: 12   Highest education level: High school graduate  Occupational History   Occupation: unemployed    Comment: disabled  Tobacco Use   Smoking status: Every Day    Packs/day: 0.25    Years: 34.00    Total pack years: 8.50    Types: Cigarettes    Start date: 05/13/1985   Smokeless tobacco: Never   Tobacco comments:    1 PPD 04/20/223  Vaping Use   Vaping Use: Former   Start date: 05/25/2018   Quit date: 09/24/2018  Substance and Sexual Activity   Alcohol use: Not Currently    Alcohol/week: 4.0 standard drinks of  alcohol    Types: 4 Glasses of wine per week    Comment: quit ETOH in Feb. 2023   Drug use: Not Currently    Types: Marijuana    Comment: 2 days ago   Sexual activity: Not Currently    Birth control/protection: Post-menopausal  Other Topics Concern   Not on file  Social History Narrative   Lives with husband and son    Social Determinants of Health   Financial Resource Strain: Medium Risk (12/02/2021)   Overall Financial Resource Strain (CARDIA)    Difficulty of Paying Living Expenses: Somewhat hard  Food Insecurity: No Food Insecurity (12/02/2021)   Hunger Vital Sign    Worried About Running Out of Food in the Last Year: Never true    [place] in the Last Year: Never true  Transportation Needs: Unmet Transportation Needs (08/24/2022)   PRAPARE - Transportation    Lack of Transportation (Medical): Yes    Lack of Transportation (Non-Medical): Yes  Physical Activity: Inactive (12/02/2021)   Exercise Vital Sign    Days of Exercise per Week: 0 days    Minutes of Exercise per Session: 0 min  Stress: No Stress Concern Present (12/02/2021)   [place] of Stress : Not at all  Social Connections: Moderately Integrated (12/02/2021)   Social Connection and Isolation Panel [NHANES]    Frequency of Communication with Friends and Family: More than three times a week    Frequency of Social Gatherings with Friends and Family: Twice a week    Attends Religious Services: More than 4 times per year    Active Member of [employer] or Organizations: No    Attends Archivist Meetings: Never    Marital Status: Married  Human resources officer Violence: Not At Risk (07/07/2021)   Humiliation, Afraid, Rape, and Kick questionnaire    Fear of Current or Ex-Partner: No    Emotionally Abused: No    Physically Abused: No    Sexually Abused: No    FAMILY HISTORY: Family History  Problem Relation Age of Onset   Depression  Mother    Anxiety disorder Mother    Diabetes Mother    Hypertension Mother    Hyperlipidemia Mother    Cancer Mother    Uterine cancer Mother 21   Cervical cancer Mother 73   Colon cancer Father    Depression Brother    Anxiety disorder Brother    Cancer Maternal Aunt  unk types   Diabetes Mellitus II Maternal Grandmother    Hypercholesterolemia Maternal Grandmother    Breast cancer Maternal Grandmother    Cancer Paternal Grandmother    Diabetes Paternal Grandmother    Melanoma Paternal Grandmother    Stomach cancer Paternal Grandmother     ALLERGIES:  is allergic to metformin and related, nsaids, perphenazine, sulfa antibiotics, abilify [aripiprazole], and penicillins.  MEDICATIONS:  Current Outpatient Medications  Medication Sig Dispense Refill   acetaminophen (TYLENOL) 325 MG tablet Take 650 mg by mouth every 6 (six) hours as needed for headache (pain).     albuterol (VENTOLIN HFA) 108 (90 Base) MCG/ACT inhaler Inhale 2 puffs into the lungs every 6 (six) hours as needed for wheezing or shortness of breath. 1 each 5   amantadine (SYMMETREL) 100 MG capsule Take 100 mg by mouth 2 (two) times daily.     blood glucose meter kit and supplies Dispense based on patient and insurance preference. Use up to four times daily as directed. (FOR ICD-10 E10.9, E11.9). 1 each 0   Budeson-Glycopyrrol-Formoterol (BREZTRI AEROSPHERE) 160-9-4.8 MCG/ACT AERO Inhale 2 puffs into the lungs in the morning and at bedtime. 5.9 g 11   buPROPion (WELLBUTRIN XL) 150 MG 24 hr tablet Take 150 mg by mouth every morning.     capecitabine (XELODA) 150 MG tablet Take 300 mg by mouth 2 (two) times daily after a meal. Take along with 544m tablets. Take Monday- Friday. Take only on days of radiation.     capecitabine (XELODA) 500 MG tablet Take 2 tablets (1,000 mg total) by mouth 2 (two) times daily after a meal. Take along with 1577mtablets. Take Monday- Friday. Take only on days of radiation. 112 tablet 0    Cholecalciferol (VITAMIN D-3) 125 MCG (5000 UT) TABS Take 5,000 Units by mouth daily. 30 tablet 1   docusate sodium (COLACE) 100 MG capsule Take 1 capsule (100 mg total) by mouth 2 (two) times daily. 60 capsule 1   FARXIGA 10 MG TABS tablet TAKE 1 TABLET BY MOUTH DAILY BEFORE BREAKFAST. 30 tablet 2   gabapentin (NEURONTIN) 300 MG capsule Take 300 mg by mouth 2 (two) times daily.     hydrocortisone cream 0.5 % Apply 1 Application topically 2 (two) times daily as needed for itching. 30 g 1   Hydrocortisone, Perianal, 1 % CREA Apply topically.     hydrOXYzine (VISTARIL) 50 MG capsule Take 50 mg by mouth 3 (three) times daily as needed for anxiety.     icosapent Ethyl (VASCEPA) 1 g capsule TAKE 2 CAPSULES BY MOUTH 2 TIMES DAILY 120 capsule 3   levothyroxine (SYNTHROID) 75 MCG tablet TAKE 1 TABLET BY MOUTH DAILY BEFORE BREAKFAST 90 tablet 0   lidocaine-prilocaine (EMLA) cream Apply to affected area once 30 g 3   loratadine (CLARITIN) 10 MG tablet Take 10 mg by mouth every morning.     losartan (COZAAR) 25 MG tablet Take 1 tablet (25 mg total) by mouth at bedtime. 30 tablet 3   NON FORMULARY Pt uses a cpap nightly     nystatin (MYCOSTATIN) 100000 UNIT/ML suspension Take 5 mLs (500,000 Units total) by mouth 4 (four) times daily. Swish and spit 60 mL 0   ondansetron (ZOFRAN) 8 MG tablet Take 1 tablet (8 mg total) by mouth 2 (two) times daily as needed for refractory nausea / vomiting. Start on day 3 after chemotherapy. 30 tablet 1   paliperidone (INVEGA SUSTENNA) 234 MG/1.5ML SUSY injection Inject 234 mg into the  muscle every 30 (thirty) days. On or about the 14th of each month     pantoprazole (PROTONIX) 40 MG tablet TAKE 1 TABLET BY MOUTH DAILY 90 tablet 0   Pediatric Multivit-Minerals-C (CHEWABLES MULTIVITAMIN PO) Take 1 tablet by mouth daily.     pioglitazone (ACTOS) 15 MG tablet Take 1 tablet (15 mg total) by mouth daily. 30 tablet 3   prochlorperazine (COMPAZINE) 10 MG tablet Take 1 tablet (10 mg  total) by mouth every 6 (six) hours as needed for nausea or vomiting. 60 tablet 3   rosuvastatin (CRESTOR) 40 MG tablet Take 1 tablet (40 mg total) by mouth every morning. 30 tablet 3   sertraline (ZOLOFT) 100 MG tablet Take 200 mg by mouth every morning.     Spacer/Aero-Holding Chambers (AEROCHAMBER MV) inhaler Use as instructed 1 each 0   No current facility-administered medications for this visit.     PHYSICAL EXAMINATION: ECOG PERFORMANCE STATUS: 0 - Asymptomatic Vitals:   08/24/22 1025  BP: 108/75  Pulse: 93  Resp: 16    Filed Weights   08/24/22 1025  Weight: 132 lb 9.6 oz (60.1 kg)     Physical Exam Constitutional:      General: She is not in acute distress. HENT:     Head: Normocephalic and atraumatic.  Eyes:     General: No scleral icterus. Cardiovascular:     Rate and Rhythm: Normal rate and regular rhythm.     Heart sounds: Normal heart sounds.  Pulmonary:     Effort: Pulmonary effort is normal. No respiratory distress.     Breath sounds: Wheezing present.  Abdominal:     General: Bowel sounds are normal. There is no distension.     Palpations: Abdomen is soft.     Comments: Liquid stool in ostomy bag.  Musculoskeletal:        General: No deformity. Normal range of motion.     Cervical back: Normal range of motion and neck supple.  Skin:    General: Skin is warm and dry.     Findings: No erythema or rash.  Neurological:     Mental Status: She is alert and oriented to person, place, and time. Mental status is at baseline.     Cranial Nerves: No cranial nerve deficit.     Coordination: Coordination normal.  Psychiatric:        Mood and Affect: Mood normal.     LABORATORY DATA:  I have reviewed the data as listed    Latest Ref Rng & Units 08/24/2022   10:13 AM 08/17/2022    9:45 AM 08/09/2022    9:33 AM  CBC  WBC 4.0 - 10.5 K/uL 7.5  8.0  6.7   Hemoglobin 12.0 - 15.0 g/dL 13.4  13.7  14.6   Hematocrit 36.0 - 46.0 % 39.7  40.5  43.7   Platelets  150 - 400 K/uL 110  108  94       Latest Ref Rng & Units 08/24/2022   10:13 AM 08/17/2022    9:45 AM 08/09/2022    9:33 AM  CMP  Glucose 70 - 99 mg/dL 118  170  130   BUN 6 - 20 mg/dL '11  16  17   ' Creatinine 0.44 - 1.00 mg/dL 0.85  0.97  1.03   Sodium 135 - 145 mmol/L 136  138  135   Potassium 3.5 - 5.1 mmol/L 3.6  3.3  3.9   Chloride 98 - 111 mmol/L 102  104  100   CO2 22 - 32 mmol/L '26  26  27   ' Calcium 8.9 - 10.3 mg/dL 9.1  8.9  9.2   Total Protein 6.5 - 8.1 g/dL 7.1  6.9  7.6   Total Bilirubin 0.3 - 1.2 mg/dL 0.4  0.5  0.6   Alkaline Phos 38 - 126 U/L 78  84  82   AST 15 - 41 U/L '17  21  17   ' ALT 0 - 44 U/L '11  12  15         ' RADIOGRAPHIC STUDIES: I have personally reviewed the radiological images as listed and agreed with the findings in the report. No results found.

## 2022-08-24 NOTE — Assessment & Plan Note (Signed)
Stage III. pT3 pN1b cM0, s/p APR. Currently on adjuvant chemotherapy. S/p FOLFOX x 4 cycles Labs reviewed and discussed with patient Continue Xeloda 1300 mg twice daily.  Overall she tolerates well. Proceed with IV fluid normal saline 1 L x 1.

## 2022-08-24 NOTE — Progress Notes (Signed)
Labs obtained and IV hydration provided in clinic today. [facility] [place] [facility].

## 2022-08-24 NOTE — Assessment & Plan Note (Signed)
Trending low, close monitor. 

## 2022-08-24 NOTE — Assessment & Plan Note (Signed)
Chemotherapy plan as listed above 

## 2022-08-24 NOTE — Progress Notes (Signed)
Pt here for follow up. Pt reports that she feels like she is not emptying enough from her colostomy bag. She has bloating and discomfort to stomach. Pt also reports a dry, scaly patch of skin to back .

## 2022-08-24 NOTE — Progress Notes (Signed)
Patient: April Luna, Female    DOB: [birth year], 51 y.o.   MRN: [id number]  Visit Date: 08/25/2022  Today's Provider: Loistine Chance, MD   Chief Complaint  Patient presents with   Medicare Wellness    Subjective:    HPI April Luna is a 51 y.o. female who presents today for her Subsequent Annual Wellness Visit.  Patient/Caregiver input:  she has been weakness - it is slightly better since started radiation therapy   Review of Systems  Constitutional: Negative for fever, positive for weight change - lost over 30 lbs since rectal cancer surgery but now weight is stable.   Respiratory: positive mild intermittent  cough and shortness of breath.   Cardiovascular: Negative for chest pain or palpitations.  Gastrointestinal: positive  for intermittent  abdominal pain, colostomy bag  Musculoskeletal: Negative for gait problem or joint swelling.  Skin: Negative for rash.  Neurological: Negative for dizziness or headache.  No other specific complaints in a complete review of systems (except as listed in HPI above).  Past Medical History:  Diagnosis Date   Allergy    pollen   Anxiety    Arthritis    right hip   Bipolar 1 disorder (HCC)    Cancer (HCC)    rectal   Chronic kidney disease    COPD (chronic obstructive pulmonary disease) (HCC)    Depression    Family history of adverse reaction to anesthesia    grand father had a stroke during anesthesia   Family history of breast cancer    Family history of colon cancer    Family history of uterine cancer    GERD (gastroesophageal reflux disease)    History of kidney stones    Hyperlipidemia    Hypertension    Hypothyroidism    Panic attack    Pneumonia    Psoriasis    Sleep apnea 08/11/2021   No CPAP   Type 2 diabetes mellitus with microalbuminuria, without long-term current use of insulin ([place]) 06/24/2019    Past Surgical History:  Procedure Laterality Date   BREAST BIOPSY Left 12/14/2021   [place] bx, venus  marker, path pending   CESAREAN SECTION     COLONOSCOPY WITH PROPOFOL N/A 02/09/2022   Procedure: COLONOSCOPY WITH PROPOFOL;  Surgeon: Lin Landsman, MD;  Location: [place];  Service: Endoscopy;  Laterality: N/A;  sleep apnea   CYSTOSCOPY W/ RETROGRADES Left 11/08/2018   Procedure: CYSTOSCOPY WITH RETROGRADE PYELOGRAM;  Surgeon: Billey Co, MD;  Location: [facility];  Service: Urology;  Laterality: Left;   CYSTOSCOPY/URETEROSCOPY/HOLMIUM LASER/STENT PLACEMENT Left 11/08/2018   Procedure: CYSTOSCOPY/URETEROSCOPY/HOLMIUM LASER/STENT PLACEMENT;  Surgeon: Billey Co, MD;  Location: [facility];  Service: Urology;  Laterality: Left;   IR IMAGING GUIDED PORT INSERTION  05/11/2022   MOUTH SURGERY     wisdom teeth extraction   MOUTH SURGERY     teeth removal   POLYPECTOMY N/A 02/09/2022   Procedure: POLYPECTOMY;  Surgeon: Lin Landsman, MD;  Location: [place];  Service: Endoscopy;  Laterality: N/A;   XI ROBOTIC ASSISTED LOWER ANTERIOR RESECTION N/A 04/21/2022   Procedure: XI ROBOTIC ASSISTED LOWER ANTERIOR RESECTION WITH COLOSTOMY, BILATERAL TAP BLOCK, ASSESSMENT OF TISSUE PERFUSSION WITH FIREFLY INJECTION;  Surgeon: Ileana Roup, MD;  Location: [facility];  Service: General;  Laterality: N/A;    Family History  Problem Relation Age of Onset   Depression Mother    Anxiety disorder Mother    Diabetes Mother  Hypertension Mother    Hyperlipidemia Mother    Cancer Mother    Uterine cancer Mother 30   Cervical cancer Mother 70   Colon cancer Father    Depression Brother    Anxiety disorder Brother    Cancer Maternal Aunt        unk types   Diabetes Mellitus II Maternal Grandmother    Hypercholesterolemia Maternal Grandmother    Breast cancer Maternal Grandmother    Cancer Paternal Grandmother    Diabetes Paternal Grandmother    Melanoma Paternal Grandmother    Stomach cancer Paternal Grandmother     Social History   Socioeconomic History    Marital status: Married    Spouse name: Montine Circle   Number of children: 1   Years of education: 12   Highest education level: High school graduate  Occupational History   Occupation: unemployed    Comment: disabled  Tobacco Use   Smoking status: Every Day    Packs/day: 0.25    Years: 34.00    Total pack years: 8.50    Types: Cigarettes    Start date: 05/13/1985   Smokeless tobacco: Never   Tobacco comments:    1 PPD 04/20/223  Vaping Use   Vaping Use: Former   Start date: 05/25/2018   Quit date: 09/24/2018  Substance and Sexual Activity   Alcohol use: Not Currently    Alcohol/week: 4.0 standard drinks of alcohol    Types: 4 Glasses of wine per week    Comment: quit ETOH in Feb. 2023   Drug use: Not Currently    Types: Marijuana    Comment: 2 days ago   Sexual activity: Not Currently    Birth control/protection: Post-menopausal  Other Topics Concern   Not on file  Social History Narrative   Lives with husband and son    Social Determinants of Health   Financial Resource Strain: High Risk (08/25/2022)   Overall Financial Resource Strain (CARDIA)    Difficulty of Paying Living Expenses: Hard  Food Insecurity: Food Insecurity Present (08/25/2022)   Hunger Vital Sign    Worried About Running Out of Food in the Last Year: Sometimes true    Ran Out of Food in the Last Year: Sometimes true  Transportation Needs: Unmet Transportation Needs (08/25/2022)   PRAPARE - Transportation    Lack of Transportation (Medical): Yes    Lack of Transportation (Non-Medical): Yes  Physical Activity: Insufficiently Active (08/25/2022)   Exercise Vital Sign    Days of Exercise per Week: 1 day    Minutes of Exercise per Session: 10 min  Stress: Stress Concern Present (08/25/2022)   [place] of Stress : To some extent  Social Connections: Moderately Integrated (08/25/2022)   Social Connection and Isolation Panel  [NHANES]    Frequency of Communication with Friends and Family: More than three times a week    Frequency of Social Gatherings with Friends and Family: Twice a week    Attends Religious Services: Never    Marine scientist or Organizations: No    Attends Music therapist: 1 to 4 times per year    Marital Status: Married  Human resources officer Violence: Not At Risk (08/25/2022)   Humiliation, Afraid, Rape, and Kick questionnaire    Fear of Current or Ex-Partner: No    Emotionally Abused: No    Physically Abused: No    Sexually Abused: No  Outpatient Encounter Medications as of 08/25/2022  Medication Sig   acetaminophen (TYLENOL) 325 MG tablet Take 650 mg by mouth every 6 (six) hours as needed for headache (pain).   albuterol (VENTOLIN HFA) 108 (90 Base) MCG/ACT inhaler Inhale 2 puffs into the lungs every 6 (six) hours as needed for wheezing or shortness of breath.   amantadine (SYMMETREL) 100 MG capsule Take 100 mg by mouth 2 (two) times daily.   blood glucose meter kit and supplies Dispense based on patient and insurance preference. Use up to four times daily as directed. (FOR ICD-10 E10.9, E11.9).   Budeson-Glycopyrrol-Formoterol (BREZTRI AEROSPHERE) 160-9-4.8 MCG/ACT AERO Inhale 2 puffs into the lungs in the morning and at bedtime.   buPROPion (WELLBUTRIN XL) 150 MG 24 hr tablet Take 150 mg by mouth every morning.   capecitabine (XELODA) 150 MG tablet Take 300 mg by mouth 2 (two) times daily after a meal. Take along with 592m tablets. Take Monday- Friday. Take only on days of radiation.   capecitabine (XELODA) 500 MG tablet Take 2 tablets (1,000 mg total) by mouth 2 (two) times daily after a meal. Take along with 1542mtablets. Take Monday- Friday. Take only on days of radiation.   docusate sodium (COLACE) 100 MG capsule Take 1 capsule (100 mg total) by mouth 2 (two) times daily.   FARXIGA 10 MG TABS tablet TAKE 1 TABLET BY MOUTH DAILY BEFORE BREAKFAST.   gabapentin  (NEURONTIN) 300 MG capsule Take 300 mg by mouth 2 (two) times daily.   hydrocortisone cream 0.5 % Apply 1 Application topically 2 (two) times daily as needed for itching.   Hydrocortisone, Perianal, 1 % CREA Apply topically.   hydrOXYzine (VISTARIL) 50 MG capsule Take 50 mg by mouth 3 (three) times daily as needed for anxiety.   icosapent Ethyl (VASCEPA) 1 g capsule TAKE 2 CAPSULES BY MOUTH 2 TIMES DAILY   levothyroxine (SYNTHROID) 75 MCG tablet TAKE 1 TABLET BY MOUTH DAILY BEFORE BREAKFAST   lidocaine-prilocaine (EMLA) cream Apply to affected area once   loratadine (CLARITIN) 10 MG tablet Take 10 mg by mouth every morning.   losartan (COZAAR) 25 MG tablet Take 1 tablet (25 mg total) by mouth at bedtime.   NON FORMULARY Pt uses a cpap nightly   nystatin (MYCOSTATIN) 100000 UNIT/ML suspension Take 5 mLs (500,000 Units total) by mouth 4 (four) times daily. Swish and spit   ondansetron (ZOFRAN) 8 MG tablet Take 1 tablet (8 mg total) by mouth 2 (two) times daily as needed for refractory nausea / vomiting. Start on day 3 after chemotherapy.   paliperidone (INVEGA SUSTENNA) 234 MG/1.5ML SUSY injection Inject 234 mg into the muscle every 30 (thirty) days. On or about the 14th of each month   pantoprazole (PROTONIX) 40 MG tablet TAKE 1 TABLET BY MOUTH DAILY   Pediatric Multivit-Minerals-C (CHEWABLES MULTIVITAMIN PO) Take 1 tablet by mouth daily.   pioglitazone (ACTOS) 15 MG tablet Take 1 tablet (15 mg total) by mouth daily.   prochlorperazine (COMPAZINE) 10 MG tablet Take 1 tablet (10 mg total) by mouth every 6 (six) hours as needed for nausea or vomiting.   rosuvastatin (CRESTOR) 40 MG tablet Take 1 tablet (40 mg total) by mouth every morning.   sertraline (ZOLOFT) 100 MG tablet Take 200 mg by mouth every morning.   Spacer/Aero-Holding Chambers (AEROCHAMBER MV) inhaler Use as instructed   Cholecalciferol (VITAMIN D-3) 125 MCG (5000 UT) TABS Take 5,000 Units by mouth daily. (Patient not taking: Reported  on 08/25/2022)   [  EXPIRED] heparin lock flush 100 unit/mL    No facility-administered encounter medications on file as of 08/25/2022.    Allergies  Allergen Reactions   Metformin And Related Nausea And Vomiting   Nsaids Other (See Comments)    Stage 3 CKD   Perphenazine Other (See Comments)    Tremors, muscle weakness, tongue swelling   Sulfa Antibiotics Other (See Comments)    GI distress    Abilify [Aripiprazole] Rash   Penicillins Rash    She has taken amoxicillin without problems    Care Team Updated in EHR: Yes  Last Vision Exam: a couple of year ago  Wears corrective lenses: No Last Dental Exam: 3 years ago  Last Hearing Exam: screen today  Wears Hearing Aids: No  Functional Ability / Safety Screening 1.  Was the timed Get Up and Go test shorter than 30 seconds?  yes 2.  Does the patient need help  transportation, shopping,      preparing meals, housework, laundry she needs help, but she is able to take      medications, or managing money 3.  Is the patient's home free of loose throw rugs in walkways, pet beds, electrical cords, etc?   Yes       Grab bars in the bathroom? yes      Handrails on the stairs?   yes      Adequate lighting?   yes 4.  Has the patient noticed any hearing difficulties?   Normal hearing screen but noticed some hearing difficulty on left side   Diet Recall and Exercise Regimen: she eats a good dinner every night, during the day she snack, trying to keep her weight up. She avoid junk food   Advanced Care Planning: A voluntary discussion about advance care planning including the explanation and discussion of advance directives.  Discussed health care proxy and Living will, and the patient was able to identify a health care proxy as husband .  Patient does not have a living will at present time. If patient does have living will, I have requested they bring this to the clinic to be scanned in to their chart. Does patient have a HCPOA?    no If yes, name  and contact information: N/A Does patient have a living will or MOST form?  no  Cancer Screenings: Skin: she has a dry spot on her buttocks , chronic but worse since radiation  Lung:  Low Dose CT Chest recommended if Age 95-80 years, 30 pack-year currently smoking OR have quit w/in 15years. Patient does not qualify. Breast:  Up to date on Mammogram? Yes  Up to date of Bone Density/Dexa? N/A Colon: currently being treated for rectal cancer- diagnosed Feb  2023  Additional Screenings:  Hepatitis B/HIV/Syphillis: N/A Hepatitis C Screening: up to date  Intimate Partner Violence: negative screen   Objective:   Vitals: BP 116/72   Pulse 96   Resp 16   Ht '4\' 11"'  (1.499 m)   Wt 133 lb (60.3 kg)   SpO2 98%   BMI 26.86 kg/m  Body mass index is 26.86 kg/m.  Hearing Screening   '500Hz'  '1000Hz'  '2000Hz'  '4000Hz'   Right ear '20 20 20 20  ' Left ear 40 40 40 40   Vision Screening   Right eye Left eye Both eyes  Without correction '20/30 20/30 20/30 '  With correction       Physical Exam  Constitutional: Patient appears well-developed and well-nourished. O No distress.  HEENT: head atraumatic, normocephalic,  pupils equal and reactive to light, ears normal TM, neck supple Cardiovascular: Normal rate, regular rhythm and normal heart sounds.  No murmur heard. No BLE edema. Pulmonary/Chest: Effort normal , rhonchi on both side  No respiratory distress. Abdominal: Soft.  There is no tenderness. Psychiatric: Patient has a normal mood and affect. behavior is normal. Judgment and thought content normal.  Cognitive Testing - 6-CIT  Correct? Score   What year is it? yes 0 Yes = 0    No = 4  What month is it? yes 0 Yes = 0    No = 3  Remember:     [place]ia [place], [place], [place]     What time is it? yes 0 Yes = 0    No = 3  Count backwards from 20 to 1 yes 0 Correct = 0    1 error = 2   More than 1 error = 4  Say the months of the year in reverse. yes 0 Correct = 0    1 error = 2   More than 1  error = 4  What address did I ask you to remember? yes 0 Correct = 0  1 error = 2    2 error = 4    3 error = 6    4 error = 8    All wrong = 10       TOTAL SCORE  0/28   Interpretation:  Normal  Normal (0-7) Abnormal (8-28)   Fall Risk:    08/25/2022    9:58 AM 07/17/2022    1:29 PM 03/15/2022    2:07 PM 10/24/2021   11:30 AM 10/06/2021   11:25 AM  Fall Risk   Falls in the past year? 0 0 0 0 0  Number falls in past yr: 0  0  0  Injury with Fall? 0  0  0  Risk for fall due to : No Fall Risks No Fall Risks No Fall Risks    Follow up Falls prevention discussed Falls prevention discussed Falls prevention discussed Falls prevention discussed     Depression Screen    08/25/2022   10:15 AM 07/17/2022    1:02 PM 03/15/2022    2:07 PM 12/02/2021    1:58 PM 10/24/2021   11:31 AM  Depression screen PHQ 2/9  Decreased Interest 0 '2 1 1 1  ' Down, Depressed, Hopeless 0 1 2 0 1  PHQ - 2 Score 0 '3 3 1 2  ' Altered sleeping 1 2 0  1  Tired, decreased energy '1 3 3  2  ' Change in appetite '2 3 3  3  ' Feeling bad or failure about yourself  0 '1 3  1  ' Trouble concentrating 1 1 0  1  Moving slowly or fidgety/restless 0 2 0  1  Suicidal thoughts 0 0 0  0  PHQ-9 Score '5 15 12  11  ' Difficult doing work/chores  Somewhat difficult   Somewhat difficult    Recent Results (from the past 2160 hour(s))  CBC with Differential/Platelet     Status: Abnormal   Collection Time: 06/02/22  8:18 AM  Result Value Ref Range   WBC 9.5 4.0 - 10.5 K/uL   RBC 4.55 3.87 - 5.11 MIL/uL   Hemoglobin 14.2 12.0 - 15.0 g/dL   HCT 42.4 36.0 - 46.0 %   MCV 93.2 80.0 - 100.0 fL   MCH 31.2 26.0 - 34.0 pg   MCHC 33.5  30.0 - 36.0 g/dL   RDW 12.5 11.5 - 15.5 %   Platelets 143 (L) 150 - 400 K/uL   nRBC 0.0 0.0 - 0.2 %   Neutrophils Relative % 73 %   Neutro Abs 6.8 1.7 - 7.7 K/uL   Lymphocytes Relative 18 %   Lymphs Abs 1.7 0.7 - 4.0 K/uL   Monocytes Relative 4 %   Monocytes Absolute 0.4 0.1 - 1.0 K/uL   Eosinophils Relative 5  %   Eosinophils Absolute 0.5 0.0 - 0.5 K/uL   Basophils Relative 0 %   Basophils Absolute 0.0 0.0 - 0.1 K/uL   Immature Granulocytes 0 %   Abs Immature Granulocytes 0.02 0.00 - 0.07 K/uL    Comment: Performed at [facility], [address] [place], [place] [address]  Comprehensive metabolic panel     Status: Abnormal   Collection Time: 06/02/22  8:18 AM  Result Value Ref Range   Sodium 136 135 - 145 mmol/L   Potassium 4.1 3.5 - 5.1 mmol/L   Chloride 101 98 - 111 mmol/L   CO2 26 22 - 32 mmol/L   Glucose, Bld 230 (H) 70 - 99 mg/dL    Comment: Glucose reference range applies only to samples taken after fasting for at least 8 hours.   BUN 18 6 - 20 mg/dL   Creatinine, Ser 0.96 0.44 - 1.00 mg/dL   Calcium 8.9 8.9 - 10.3 mg/dL   Total Protein 7.0 6.5 - 8.1 g/dL   Albumin 3.8 3.5 - 5.0 g/dL   AST 18 15 - 41 U/L   ALT 14 0 - 44 U/L   Alkaline Phosphatase 87 38 - 126 U/L   Total Bilirubin 0.2 (L) 0.3 - 1.2 mg/dL   GFR, Estimated >60 >60 mL/min    Comment: (NOTE) Calculated using the CKD-EPI Creatinine Equation (2021)    Anion gap 9 5 - 15    Comment: Performed at [facility], [address] [place], [place] [address]  CBC with Differential/Platelet     Status: Abnormal   Collection Time: 06/09/22  8:24 AM  Result Value Ref Range   WBC 8.0 4.0 - 10.5 K/uL   RBC 4.71 3.87 - 5.11 MIL/uL   Hemoglobin 14.6 12.0 - 15.0 g/dL   HCT 43.8 36.0 - 46.0 %   MCV 93.0 80.0 - 100.0 fL   MCH 31.0 26.0 - 34.0 pg   MCHC 33.3 30.0 - 36.0 g/dL   RDW 13.0 11.5 - 15.5 %   Platelets 123 (L) 150 - 400 K/uL   nRBC 0.0 0.0 - 0.2 %   Neutrophils Relative % 65 %   Neutro Abs 5.2 1.7 - 7.7 K/uL   Lymphocytes Relative 24 %   Lymphs Abs 1.9 0.7 - 4.0 K/uL   Monocytes Relative 8 %   Monocytes Absolute 0.7 0.1 - 1.0 K/uL   Eosinophils Relative 2 %   Eosinophils Absolute 0.2 0.0 - 0.5 K/uL   Basophils Relative 1 %   Basophils Absolute 0.1 0.0 - 0.1 K/uL   Immature Granulocytes 0 %   Abs  Immature Granulocytes 0.01 0.00 - 0.07 K/uL    Comment: Performed at [facility], [address] [place], [place] [address]  Comprehensive metabolic panel     Status: Abnormal   Collection Time: 06/09/22  8:24 AM  Result Value Ref Range   Sodium 136 135 - 145 mmol/L   Potassium 3.7 3.5 - 5.1 mmol/L   Chloride 102 98 -  111 mmol/L   CO2 26 22 - 32 mmol/L   Glucose, Bld 225 (H) 70 - 99 mg/dL    Comment: Glucose reference range applies only to samples taken after fasting for at least 8 hours.   BUN 10 6 - 20 mg/dL   Creatinine, Ser 0.86 0.44 - 1.00 mg/dL   Calcium 8.7 (L) 8.9 - 10.3 mg/dL   Total Protein 7.1 6.5 - 8.1 g/dL   Albumin 3.9 3.5 - 5.0 g/dL   AST 20 15 - 41 U/L   ALT 16 0 - 44 U/L   Alkaline Phosphatase 87 38 - 126 U/L   Total Bilirubin 0.3 0.3 - 1.2 mg/dL   GFR, Estimated >60 >60 mL/min    Comment: (NOTE) Calculated using the CKD-EPI Creatinine Equation (2021)    Anion gap 8 5 - 15    Comment: Performed at [facility], [address] [place], [place] [address]  CBC with Differential/Platelet     Status: Abnormal   Collection Time: 06/23/22  7:51 AM  Result Value Ref Range   WBC 7.5 4.0 - 10.5 K/uL   RBC 4.50 3.87 - 5.11 MIL/uL   Hemoglobin 14.1 12.0 - 15.0 g/dL   HCT 42.3 36.0 - 46.0 %   MCV 94.0 80.0 - 100.0 fL   MCH 31.3 26.0 - 34.0 pg   MCHC 33.3 30.0 - 36.0 g/dL   RDW 13.5 11.5 - 15.5 %   Platelets 88 (L) 150 - 400 K/uL   nRBC 0.0 0.0 - 0.2 %   Neutrophils Relative % 56 %   Neutro Abs 4.3 1.7 - 7.7 K/uL   Lymphocytes Relative 28 %   Lymphs Abs 2.1 0.7 - 4.0 K/uL   Monocytes Relative 11 %   Monocytes Absolute 0.8 0.1 - 1.0 K/uL   Eosinophils Relative 4 %   Eosinophils Absolute 0.3 0.0 - 0.5 K/uL   Basophils Relative 1 %   Basophils Absolute 0.1 0.0 - 0.1 K/uL   Immature Granulocytes 0 %   Abs Immature Granulocytes 0.01 0.00 - 0.07 K/uL    Comment: Performed at [facility], [address] [place], [place] [address]   Comprehensive metabolic panel     Status: Abnormal   Collection Time: 06/23/22  7:51 AM  Result Value Ref Range   Sodium 136 135 - 145 mmol/L   Potassium 3.3 (L) 3.5 - 5.1 mmol/L   Chloride 101 98 - 111 mmol/L   CO2 27 22 - 32 mmol/L   Glucose, Bld 154 (H) 70 - 99 mg/dL    Comment: Glucose reference range applies only to samples taken after fasting for at least 8 hours.   BUN 11 6 - 20 mg/dL   Creatinine, Ser 1.23 (H) 0.44 - 1.00 mg/dL   Calcium 8.7 (L) 8.9 - 10.3 mg/dL   Total Protein 6.6 6.5 - 8.1 g/dL   Albumin 3.6 3.5 - 5.0 g/dL   AST 19 15 - 41 U/L   ALT 14 0 - 44 U/L   Alkaline Phosphatase 81 38 - 126 U/L   Total Bilirubin 0.3 0.3 - 1.2 mg/dL   GFR, Estimated 54 (L) >60 mL/min    Comment: (NOTE) Calculated using the CKD-EPI Creatinine Equation (2021)    Anion gap 8 5 - 15    Comment: Performed at [facility], [address], [place], [place] [address]  CBC with Differential/Platelet     Status: Abnormal   Collection Time: 07/07/22  8:21 AM  Result Value Ref Range   WBC 6.2 4.0 - 10.5 K/uL   RBC 4.52 3.87 - 5.11 MIL/uL   Hemoglobin 14.6 12.0 - 15.0 g/dL   HCT 42.8 36.0 - 46.0 %   MCV 94.7 80.0 - 100.0 fL   MCH 32.3 26.0 - 34.0 pg   MCHC 34.1 30.0 - 36.0 g/dL   RDW 15.2 11.5 - 15.5 %   Platelets 87 (L) 150 - 400 K/uL   nRBC 0.0 0.0 - 0.2 %   Neutrophils Relative % 57 %   Neutro Abs 3.6 1.7 - 7.7 K/uL   Lymphocytes Relative 30 %   Lymphs Abs 1.8 0.7 - 4.0 K/uL   Monocytes Relative 9 %   Monocytes Absolute 0.5 0.1 - 1.0 K/uL   Eosinophils Relative 3 %   Eosinophils Absolute 0.2 0.0 - 0.5 K/uL   Basophils Relative 1 %   Basophils Absolute 0.1 0.0 - 0.1 K/uL   Immature Granulocytes 0 %   Abs Immature Granulocytes 0.01 0.00 - 0.07 K/uL    Comment: Performed at [facility], [address] [place], [place] [address]  Comprehensive metabolic panel     Status: Abnormal   Collection Time: 07/07/22  8:21 AM  Result Value Ref Range   Sodium 139 135 -  145 mmol/L   Potassium 4.0 3.5 - 5.1 mmol/L   Chloride 102 98 - 111 mmol/L   CO2 27 22 - 32 mmol/L   Glucose, Bld 196 (H) 70 - 99 mg/dL    Comment: Glucose reference range applies only to samples taken after fasting for at least 8 hours.   BUN 15 6 - 20 mg/dL   Creatinine, Ser 1.08 (H) 0.44 - 1.00 mg/dL   Calcium 9.0 8.9 - 10.3 mg/dL   Total Protein 6.7 6.5 - 8.1 g/dL   Albumin 3.8 3.5 - 5.0 g/dL   AST 18 15 - 41 U/L   ALT 14 0 - 44 U/L   Alkaline Phosphatase 89 38 - 126 U/L   Total Bilirubin 0.6 0.3 - 1.2 mg/dL   GFR, Estimated >60 >60 mL/min    Comment: (NOTE) Calculated using the CKD-EPI Creatinine Equation (2021)    Anion gap 10 5 - 15    Comment: Performed at [facility], [address], [place], [place] [address]  Urine Culture     Status: Abnormal   Collection Time: 07/10/22  1:35 PM   Specimen: Urine, Clean Catch  Result Value Ref Range   Specimen Description      URINE, CLEAN CATCH Performed at [facility], [address], [place], [place] [address]    Special Requests      NONE Performed at [facility], [address] [place], [place] [address]    Culture (A)     >=100,000 COLONIES/mL KLEBSIELLA OXYTOCA Confirmed Extended Spectrum Beta-Lactamase Producer (ESBL).  In bloodstream infections from ESBL organisms, carbapenems are preferred over piperacillin/tazobactam. They are shown to have a lower risk of mortality.    Report Status 07/12/2022 FINAL    Organism ID, Bacteria KLEBSIELLA OXYTOCA (A)       Susceptibility   Klebsiella oxytoca - MIC*    AMPICILLIN >=32 RESISTANT Resistant     CEFAZOLIN RESISTANT Resistant     CEFEPIME <=0.12 SENSITIVE Sensitive     CEFTRIAXONE <=0.25 SENSITIVE Sensitive     CIPROFLOXACIN <=0.25 SENSITIVE Sensitive     GENTAMICIN <=1 SENSITIVE Sensitive     IMIPENEM <=0.25 SENSITIVE Sensitive  NITROFURANTOIN 32 SENSITIVE Sensitive     TRIMETH/SULFA <=20 SENSITIVE Sensitive     AMPICILLIN/SULBACTAM 16  INTERMEDIATE Intermediate     PIP/TAZO 16 SENSITIVE Sensitive     * >=100,000 COLONIES/mL KLEBSIELLA OXYTOCA  Urinalysis, Complete w Microscopic     Status: Abnormal   Collection Time: 07/10/22  1:35 PM  Result Value Ref Range   Color, Urine YELLOW (A) YELLOW   APPearance CLEAR (A) CLEAR   Specific Gravity, Urine 1.025 1.005 - 1.030   pH 6.0 5.0 - 8.0   Glucose, UA >=500 (A) NEGATIVE mg/dL   Hgb urine dipstick NEGATIVE NEGATIVE   Bilirubin Urine NEGATIVE NEGATIVE   Ketones, ur 5 (A) NEGATIVE mg/dL   Protein, ur 30 (A) NEGATIVE mg/dL   Nitrite NEGATIVE NEGATIVE   Leukocytes,Ua SMALL (A) NEGATIVE    Comment: Performed at [facility], [address] [place], [place] [address]  CBC with Differential/Platelet     Status: Abnormal   Collection Time: 07/10/22  1:35 PM  Result Value Ref Range   WBC 5.1 4.0 - 10.5 K/uL   RBC 4.96 3.87 - 5.11 MIL/uL   Hemoglobin 15.5 (H) 12.0 - 15.0 g/dL   HCT 46.3 (H) 36.0 - 46.0 %   MCV 93.3 80.0 - 100.0 fL   MCH 31.3 26.0 - 34.0 pg   MCHC 33.5 30.0 - 36.0 g/dL   RDW 15.6 (H) 11.5 - 15.5 %   Platelets 115 (L) 150 - 400 K/uL   nRBC 0.0 0.0 - 0.2 %   Neutrophils Relative % 64 %   Neutro Abs 3.3 1.7 - 7.7 K/uL   Lymphocytes Relative 28 %   Lymphs Abs 1.5 0.7 - 4.0 K/uL   Monocytes Relative 5 %   Monocytes Absolute 0.2 0.1 - 1.0 K/uL   Eosinophils Relative 2 %   Eosinophils Absolute 0.1 0.0 - 0.5 K/uL   Basophils Relative 1 %   Basophils Absolute 0.0 0.0 - 0.1 K/uL   Immature Granulocytes 0 %   Abs Immature Granulocytes 0.02 0.00 - 0.07 K/uL    Comment: Performed at [facility], [address] [place], [place] [address]  HM DIABETES FOOT EXAM     Status: None   Collection Time: 07/11/22 12:00 AM  Result Value Ref Range   HM Diabetic Foot Exam Normal     Comment: Performed by [facility] provider.  Report scanned in chart  Hemoglobin A1c     Status: None   Collection Time: 07/11/22 12:00 AM  Result Value Ref Range    Hemoglobin A1C 7.8   Basic metabolic panel     Status: Abnormal   Collection Time: 07/14/22 10:50 AM  Result Value Ref Range   Sodium 138 135 - 145 mmol/L   Potassium 4.1 3.5 - 5.1 mmol/L   Chloride 103 98 - 111 mmol/L   CO2 28 22 - 32 mmol/L   Glucose, Bld 234 (H) 70 - 99 mg/dL    Comment: Glucose reference range applies only to samples taken after fasting for at least 8 hours.   BUN 16 6 - 20 mg/dL   Creatinine, Ser 1.30 (H) 0.44 - 1.00 mg/dL   Calcium 9.1 8.9 - 10.3 mg/dL   GFR, Estimated 50 (L) >60 mL/min    Comment: (NOTE) Calculated using the CKD-EPI Creatinine Equation (2021)    Anion gap 7 5 - 15    Comment: Performed at [facility], [address] [place], [place] [address]  Urinalysis, Complete w  Microscopic     Status: Abnormal   Collection Time: 07/14/22 10:50 AM  Result Value Ref Range   Color, Urine YELLOW (A) YELLOW   APPearance HAZY (A) CLEAR   Specific Gravity, Urine 1.026 1.005 - 1.030   pH 6.0 5.0 - 8.0   Glucose, UA >=500 (A) NEGATIVE mg/dL   Hgb urine dipstick NEGATIVE NEGATIVE   Bilirubin Urine NEGATIVE NEGATIVE   Ketones, ur 5 (A) NEGATIVE mg/dL   Protein, ur 30 (A) NEGATIVE mg/dL   Nitrite POSITIVE (A) NEGATIVE   Leukocytes,Ua SMALL (A) NEGATIVE   RBC / HPF 0-5 0 - 5 RBC/hpf   WBC, UA 21-50 0 - 5 WBC/hpf   Bacteria, UA RARE (A) NONE SEEN   Squamous Epithelial / LPF 0-5 0 - 5   Mucus PRESENT     Comment: Performed at [facility], [address] [place], [place] [address]  POCT HgB A1C     Status: Abnormal   Collection Time: 07/17/22  1:03 PM  Result Value Ref Range   Hemoglobin A1C 7.4 (A) 4.0 - 5.6 %   HbA1c POC (<> result, manual entry)     HbA1c, POC (prediabetic range)     HbA1c, POC (controlled diabetic range)    Urine Microalbumin w/creat. ratio     Status: Abnormal   Collection Time: 07/17/22  2:16 PM  Result Value Ref Range   Creatinine, Urine 73 20 - 275 mg/dL   Microalb, Ur 2.6 mg/dL    Comment: Reference  Range Not established    Microalb Creat Ratio 36 (H) <30 mcg/mg creat    Comment: . The ADA defines abnormalities in albumin excretion as follows: Marland Kitchen Albuminuria Category        Result (mcg/mg creatinine) . Normal to Mildly increased   <30 Moderately increased         30-299  Severely increased           > OR = 300 . The ADA recommends that at least two of three specimens collected within a 3-6 month period be abnormal before considering a patient to be within a diagnostic category.   TSH     Status: None   Collection Time: 07/17/22  2:16 PM  Result Value Ref Range   TSH 2.20 mIU/L    Comment:           Reference Range .           > or = 20 Years  0.40-4.50 .                Pregnancy Ranges           First trimester    0.26-2.66           Second trimester   0.55-2.73           Third trimester    0.43-2.91   Rad Onc Aria Session Summary     Status: None   Collection Time: 07/27/22  1:25 PM  Result Value Ref Range   Course ID [email or web address]    Course Intent Unknown    Course Start Date 07/19/2022 11:59 AM    Session Number 1    Course First Treatment Date 07/27/2022  1:23 PM    Course Last Treatment Date 07/27/2022  1:25 PM    Course Elapsed Days 0    Reference Point ID Pelvis DP    Reference Point Dosage Given to Date 1.8 Gy   Reference Point Session Dosage Given 1.8  Gy   Plan ID Pelvis    Plan Name Pelvis    Plan Fractions Treated to Date 1    Plan Total Fractions Prescribed 25    Plan Prescribed Dose Per Fraction 1.8 Gy   Plan Total Prescribed Dose 45.000000 Gy   Plan Primary Reference Point Pelvis DP   CBC with Differential/Platelet     Status: Abnormal   Collection Time: 07/27/22  1:41 PM  Result Value Ref Range   WBC 6.8 4.0 - 10.5 K/uL   RBC 4.87 3.87 - 5.11 MIL/uL   Hemoglobin 15.5 (H) 12.0 - 15.0 g/dL   HCT 46.1 (H) 36.0 - 46.0 %   MCV 94.7 80.0 - 100.0 fL   MCH 31.8 26.0 - 34.0 pg   MCHC 33.6 30.0 - 36.0 g/dL   RDW 17.1 (H) 11.5 - 15.5 %   Platelets 144 (L)  150 - 400 K/uL   nRBC 0.0 0.0 - 0.2 %   Neutrophils Relative % 51 %   Neutro Abs 3.4 1.7 - 7.7 K/uL   Lymphocytes Relative 33 %   Lymphs Abs 2.3 0.7 - 4.0 K/uL   Monocytes Relative 13 %   Monocytes Absolute 0.9 0.1 - 1.0 K/uL   Eosinophils Relative 2 %   Eosinophils Absolute 0.1 0.0 - 0.5 K/uL   Basophils Relative 1 %   Basophils Absolute 0.1 0.0 - 0.1 K/uL   Immature Granulocytes 0 %   Abs Immature Granulocytes 0.02 0.00 - 0.07 K/uL    Comment: Performed at [facility], [address] [place], [place] [address]  Comprehensive metabolic panel     Status: Abnormal   Collection Time: 07/27/22  1:41 PM  Result Value Ref Range   Sodium 138 135 - 145 mmol/L   Potassium 4.2 3.5 - 5.1 mmol/L   Chloride 101 98 - 111 mmol/L   CO2 29 22 - 32 mmol/L   Glucose, Bld 151 (H) 70 - 99 mg/dL    Comment: Glucose reference range applies only to samples taken after fasting for at least 8 hours.   BUN 16 6 - 20 mg/dL   Creatinine, Ser 1.07 (H) 0.44 - 1.00 mg/dL   Calcium 9.5 8.9 - 10.3 mg/dL   Total Protein 7.7 6.5 - 8.1 g/dL   Albumin 4.2 3.5 - 5.0 g/dL   AST 24 15 - 41 U/L   ALT 24 0 - 44 U/L   Alkaline Phosphatase 109 38 - 126 U/L   Total Bilirubin 0.4 0.3 - 1.2 mg/dL   GFR, Estimated >60 >60 mL/min    Comment: (NOTE) Calculated using the CKD-EPI Creatinine Equation (2021)    Anion gap 8 5 - 15    Comment: Performed at [facility], [address] [place], [place] [address]  Rad Sandria Senter Session Summary     Status: None   Collection Time: 07/31/22  2:37 PM  Result Value Ref Range   Course ID [email or web address]    Course Intent Unknown    Course Start Date 07/19/2022 11:59 AM    Session Number 2    Course First Treatment Date 07/27/2022  1:23 PM    Course Last Treatment Date 07/31/2022  2:37 PM    Course Elapsed Days 4    Reference Point ID Pelvis DP    Reference Point Dosage Given to Date 3.6 Gy   Reference Point Session Dosage Given 1.8 Gy   Plan ID Pelvis    Plan Name Pelvis  Plan Fractions Treated to Date 2    Plan Total Fractions Prescribed 25    Plan Prescribed Dose Per Fraction 1.8 Gy   Plan Total Prescribed Dose 45.000000 Gy   Plan Primary Reference Point Pelvis DP   Comprehensive metabolic panel     Status: Abnormal   Collection Time: 08/01/22  9:39 AM  Result Value Ref Range   Sodium 137 135 - 145 mmol/L   Potassium 3.5 3.5 - 5.1 mmol/L   Chloride 97 (L) 98 - 111 mmol/L   CO2 27 22 - 32 mmol/L   Glucose, Bld 258 (H) 70 - 99 mg/dL    Comment: Glucose reference range applies only to samples taken after fasting for at least 8 hours.   BUN 15 6 - 20 mg/dL   Creatinine, Ser 1.41 (H) 0.44 - 1.00 mg/dL   Calcium 9.2 8.9 - 10.3 mg/dL   Total Protein 7.3 6.5 - 8.1 g/dL   Albumin 4.1 3.5 - 5.0 g/dL   AST 28 15 - 41 U/L   ALT 16 0 - 44 U/L   Alkaline Phosphatase 98 38 - 126 U/L   Total Bilirubin 0.4 0.3 - 1.2 mg/dL   GFR, Estimated 46 (L) >60 mL/min    Comment: (NOTE) Calculated using the CKD-EPI Creatinine Equation (2021)    Anion gap 13 5 - 15    Comment: Performed at [facility], [address] [place], [place] [address]  CBC with Differential/Platelet     Status: Abnormal   Collection Time: 08/01/22  9:39 AM  Result Value Ref Range   WBC 8.1 4.0 - 10.5 K/uL   RBC 4.79 3.87 - 5.11 MIL/uL   Hemoglobin 15.5 (H) 12.0 - 15.0 g/dL   HCT 45.6 36.0 - 46.0 %   MCV 95.2 80.0 - 100.0 fL   MCH 32.4 26.0 - 34.0 pg   MCHC 34.0 30.0 - 36.0 g/dL   RDW 16.9 (H) 11.5 - 15.5 %   Platelets 144 (L) 150 - 400 K/uL   nRBC 0.0 0.0 - 0.2 %   Neutrophils Relative % 69 %   Neutro Abs 5.6 1.7 - 7.7 K/uL   Lymphocytes Relative 21 %   Lymphs Abs 1.7 0.7 - 4.0 K/uL   Monocytes Relative 7 %   Monocytes Absolute 0.6 0.1 - 1.0 K/uL   Eosinophils Relative 2 %   Eosinophils Absolute 0.1 0.0 - 0.5 K/uL   Basophils Relative 1 %   Basophils Absolute 0.1 0.0 - 0.1 K/uL   Immature Granulocytes 0 %   Abs Immature Granulocytes 0.02 0.00 - 0.07 K/uL    Comment:  Performed at [facility], [address] [place], [place] [address]  Rad Sandria Senter Session Summary     Status: None   Collection Time: 08/01/22  1:35 PM  Result Value Ref Range   Course ID [email or web address]    Course Intent Unknown    Course Start Date 07/19/2022 11:59 AM    Session Number 3    Course First Treatment Date 07/27/2022  1:23 PM    Course Last Treatment Date 08/01/2022  1:34 PM    Course Elapsed Days 5    Reference Point ID Pelvis DP    Reference Point Dosage Given to Date 5.4 Gy   Reference Point Session Dosage Given 1.8 Gy   Plan ID Pelvis    Plan Name Pelvis    Plan Fractions Treated to Date 3    Plan Total Fractions Prescribed 25  Plan Prescribed Dose Per Fraction 1.8 Gy   Plan Total Prescribed Dose 45.000000 Gy   Plan Primary Reference Point Pelvis DP   Rad Onc Aria Session Summary     Status: None   Collection Time: 08/02/22  2:39 PM  Result Value Ref Range   Course ID [email or web address]    Course Intent Unknown    Course Start Date 07/19/2022 11:59 AM    Session Number 4    Course First Treatment Date 07/27/2022  1:23 PM    Course Last Treatment Date 08/02/2022  2:39 PM    Course Elapsed Days 6    Reference Point ID Pelvis DP    Reference Point Dosage Given to Date 7.2 Gy   Reference Point Session Dosage Given 1.8 Gy   Plan ID Pelvis    Plan Name Pelvis    Plan Fractions Treated to Date 4    Plan Total Fractions Prescribed 25    Plan Prescribed Dose Per Fraction 1.8 Gy   Plan Total Prescribed Dose 45.000000 Gy   Plan Primary Reference Point Pelvis DP   Rad Onc Aria Session Summary     Status: None   Collection Time: 08/03/22  2:22 PM  Result Value Ref Range   Course ID [email or web address]    Course Intent Unknown    Course Start Date 07/19/2022 11:59 AM    Session Number 5    Course First Treatment Date 07/27/2022  1:23 PM    Course Last Treatment Date 08/03/2022  2:22 PM    Course Elapsed Days 7    Reference Point ID Pelvis DP    Reference Point Dosage Given to Date 9 Gy    Reference Point Session Dosage Given 1.8 Gy   Plan ID Pelvis    Plan Name Pelvis    Plan Fractions Treated to Date 5    Plan Total Fractions Prescribed 25    Plan Prescribed Dose Per Fraction 1.8 Gy   Plan Total Prescribed Dose 45.000000 Gy   Plan Primary Reference Point Pelvis DP   Basic metabolic panel     Status: Abnormal   Collection Time: 08/04/22  8:15 AM  Result Value Ref Range   Sodium 137 135 - 145 mmol/L   Potassium 3.7 3.5 - 5.1 mmol/L   Chloride 100 98 - 111 mmol/L   CO2 25 22 - 32 mmol/L   Glucose, Bld 167 (H) 70 - 99 mg/dL    Comment: Glucose reference range applies only to samples taken after fasting for at least 8 hours.   BUN 18 6 - 20 mg/dL   Creatinine, Ser 1.05 (H) 0.44 - 1.00 mg/dL   Calcium 9.2 8.9 - 10.3 mg/dL   GFR, Estimated >60 >60 mL/min    Comment: (NOTE) Calculated using the CKD-EPI Creatinine Equation (2021)    Anion gap 12 5 - 15    Comment: Performed at [facility], [address] [place], [place] [address]  Rad Sandria Senter Session Summary     Status: None   Collection Time: 08/04/22  8:28 AM  Result Value Ref Range   Course ID [email or web address]    Course Intent Unknown    Course Start Date 07/19/2022 11:59 AM    Session Number 6    Course First Treatment Date 07/27/2022  1:23 PM    Course Last Treatment Date 08/04/2022  8:27 AM    Course Elapsed Days 8    Reference Point ID Pelvis DP    Reference Point Dosage Given  to Date 10.8 Gy   Reference Point Session Dosage Given 1.8 Gy   Plan ID Pelvis    Plan Name Pelvis    Plan Fractions Treated to Date 6    Plan Total Fractions Prescribed 25    Plan Prescribed Dose Per Fraction 1.8 Gy   Plan Total Prescribed Dose 45.000000 Gy   Plan Primary Reference Point Pelvis DP   Rad Onc Aria Session Summary     Status: None   Collection Time: 08/07/22  2:41 PM  Result Value Ref Range   Course ID [email or web address]    Course Intent Unknown    Course Start Date 07/19/2022 11:59 AM    Session Number 7    Course First  Treatment Date 07/27/2022  1:23 PM    Course Last Treatment Date 08/07/2022  2:41 PM    Course Elapsed Days 11    Reference Point ID Pelvis DP    Reference Point Dosage Given to Date 12.6 Gy   Reference Point Session Dosage Given 1.8 Gy   Plan ID Pelvis    Plan Name Pelvis    Plan Fractions Treated to Date 7    Plan Total Fractions Prescribed 25    Plan Prescribed Dose Per Fraction 1.8 Gy   Plan Total Prescribed Dose 45.000000 Gy   Plan Primary Reference Point Pelvis DP   Rad Onc Aria Session Summary     Status: None   Collection Time: 08/08/22  2:44 PM  Result Value Ref Range   Course ID [email or web address]    Course Intent Unknown    Course Start Date 07/19/2022 11:59 AM    Session Number 8    Course First Treatment Date 07/27/2022  1:23 PM    Course Last Treatment Date 08/08/2022  2:44 PM    Course Elapsed Days 12    Reference Point ID Pelvis DP    Reference Point Dosage Given to Date 14.4 Gy   Reference Point Session Dosage Given 1.8 Gy   Plan ID Pelvis    Plan Name Pelvis    Plan Fractions Treated to Date 8    Plan Total Fractions Prescribed 25    Plan Prescribed Dose Per Fraction 1.8 Gy   Plan Total Prescribed Dose 45.000000 Gy   Plan Primary Reference Point Pelvis DP   Rad Onc Aria Session Summary     Status: None   Collection Time: 08/09/22  9:25 AM  Result Value Ref Range   Course ID [email or web address]    Course Intent Unknown    Course Start Date 07/19/2022 11:59 AM    Session Number 9    Course First Treatment Date 07/27/2022  1:23 PM    Course Last Treatment Date 08/09/2022  9:24 AM    Course Elapsed Days 13    Reference Point ID Pelvis DP    Reference Point Dosage Given to Date 16.2 Gy   Reference Point Session Dosage Given 1.8 Gy   Plan ID Pelvis    Plan Name Pelvis    Plan Fractions Treated to Date 9    Plan Total Fractions Prescribed 25    Plan Prescribed Dose Per Fraction 1.8 Gy   Plan Total Prescribed Dose 45.000000 Gy   Plan Primary Reference Point Pelvis DP   CBC with  Differential/Platelet     Status: Abnormal   Collection Time: 08/09/22  9:33 AM  Result Value Ref Range   WBC 6.7 4.0 - 10.5 K/uL   RBC 4.53 3.87 - 5.11 MIL/uL  Hemoglobin 14.6 12.0 - 15.0 g/dL   HCT 43.7 36.0 - 46.0 %   MCV 96.5 80.0 - 100.0 fL   MCH 32.2 26.0 - 34.0 pg   MCHC 33.4 30.0 - 36.0 g/dL   RDW 17.4 (H) 11.5 - 15.5 %   Platelets 94 (L) 150 - 400 K/uL   nRBC 0.0 0.0 - 0.2 %   Neutrophils Relative % 68 %   Neutro Abs 4.6 1.7 - 7.7 K/uL   Lymphocytes Relative 18 %   Lymphs Abs 1.2 0.7 - 4.0 K/uL   Monocytes Relative 10 %   Monocytes Absolute 0.6 0.1 - 1.0 K/uL   Eosinophils Relative 3 %   Eosinophils Absolute 0.2 0.0 - 0.5 K/uL   Basophils Relative 1 %   Basophils Absolute 0.0 0.0 - 0.1 K/uL   Immature Granulocytes 0 %   Abs Immature Granulocytes 0.03 0.00 - 0.07 K/uL    Comment: Performed at [facility], [address] [place], [place] [address]  Comprehensive metabolic panel     Status: Abnormal   Collection Time: 08/09/22  9:33 AM  Result Value Ref Range   Sodium 135 135 - 145 mmol/L   Potassium 3.9 3.5 - 5.1 mmol/L   Chloride 100 98 - 111 mmol/L   CO2 27 22 - 32 mmol/L   Glucose, Bld 130 (H) 70 - 99 mg/dL    Comment: Glucose reference range applies only to samples taken after fasting for at least 8 hours.   BUN 17 6 - 20 mg/dL   Creatinine, Ser 1.03 (H) 0.44 - 1.00 mg/dL   Calcium 9.2 8.9 - 10.3 mg/dL   Total Protein 7.6 6.5 - 8.1 g/dL   Albumin 4.2 3.5 - 5.0 g/dL   AST 17 15 - 41 U/L   ALT 15 0 - 44 U/L   Alkaline Phosphatase 82 38 - 126 U/L   Total Bilirubin 0.6 0.3 - 1.2 mg/dL   GFR, Estimated >60 >60 mL/min    Comment: (NOTE) Calculated using the CKD-EPI Creatinine Equation (2021)    Anion gap 8 5 - 15    Comment: Performed at [facility], [address] [place], [place] [address]  Rad Sandria Senter Session Summary     Status: None   Collection Time: 08/11/22  1:20 PM  Result Value Ref Range   Course ID [email or web address]    Course Intent  Unknown    Course Start Date 07/19/2022 11:59 AM    Session Number 10    Course First Treatment Date 07/27/2022  1:23 PM    Course Last Treatment Date 08/11/2022  1:20 PM    Course Elapsed Days 15    Reference Point ID Pelvis DP    Reference Point Dosage Given to Date 18 Gy   Reference Point Session Dosage Given 1.8 Gy   Plan ID Pelvis    Plan Name Pelvis    Plan Fractions Treated to Date 10    Plan Total Fractions Prescribed 25    Plan Prescribed Dose Per Fraction 1.8 Gy   Plan Total Prescribed Dose 45.000000 Gy   Plan Primary Reference Point Pelvis DP   Rad Onc Aria Session Summary     Status: None   Collection Time: 08/15/22  2:45 PM  Result Value Ref Range   Course ID [email or web address]    Course Intent Unknown    Course Start Date 07/19/2022 11:59 AM    Session Number 11    Course First Treatment Date 07/27/2022  1:23 PM    Course Last Treatment Date 08/15/2022  2:45 PM    Course Elapsed Days 19    Reference Point ID Pelvis DP    Reference Point Dosage Given to Date 19.8 Gy   Reference Point Session Dosage Given 1.8 Gy   Plan ID Pelvis    Plan Name Pelvis    Plan Fractions Treated to Date 87    Plan Total Fractions Prescribed 25    Plan Prescribed Dose Per Fraction 1.8 Gy   Plan Total Prescribed Dose 45.000000 Gy   Plan Primary Reference Point Pelvis DP   Rad Onc Aria Session Summary     Status: None   Collection Time: 08/16/22  2:39 PM  Result Value Ref Range   Course ID [email or web address]    Course Intent Unknown    Course Start Date 07/19/2022 11:59 AM    Session Number 12    Course First Treatment Date 07/27/2022  1:23 PM    Course Last Treatment Date 08/16/2022  2:39 PM    Course Elapsed Days 20    Reference Point ID Pelvis DP    Reference Point Dosage Given to Date 21.6 Gy   Reference Point Session Dosage Given 1.8 Gy   Plan ID Pelvis    Plan Name Pelvis    Plan Fractions Treated to Date 12    Plan Total Fractions Prescribed 25    Plan Prescribed Dose Per Fraction 1.8 Gy   Plan  Total Prescribed Dose 45.000000 Gy   Plan Primary Reference Point Pelvis DP   CBC with Differential/Platelet     Status: Abnormal   Collection Time: 08/17/22  9:45 AM  Result Value Ref Range   WBC 8.0 4.0 - 10.5 K/uL   RBC 4.18 3.87 - 5.11 MIL/uL   Hemoglobin 13.7 12.0 - 15.0 g/dL   HCT 40.5 36.0 - 46.0 %   MCV 96.9 80.0 - 100.0 fL   MCH 32.8 26.0 - 34.0 pg   MCHC 33.8 30.0 - 36.0 g/dL   RDW 18.5 (H) 11.5 - 15.5 %   Platelets 108 (L) 150 - 400 K/uL   nRBC 0.0 0.0 - 0.2 %   Neutrophils Relative % 79 %   Neutro Abs 6.3 1.7 - 7.7 K/uL   Lymphocytes Relative 11 %   Lymphs Abs 0.9 0.7 - 4.0 K/uL   Monocytes Relative 7 %   Monocytes Absolute 0.6 0.1 - 1.0 K/uL   Eosinophils Relative 3 %   Eosinophils Absolute 0.2 0.0 - 0.5 K/uL   Basophils Relative 0 %   Basophils Absolute 0.0 0.0 - 0.1 K/uL   Immature Granulocytes 0 %   Abs Immature Granulocytes 0.03 0.00 - 0.07 K/uL    Comment: Performed at [facility], [address] [place], [place] [address]  Comprehensive metabolic panel     Status: Abnormal   Collection Time: 08/17/22  9:45 AM  Result Value Ref Range   Sodium 138 135 - 145 mmol/L   Potassium 3.3 (L) 3.5 - 5.1 mmol/L   Chloride 104 98 - 111 mmol/L   CO2 26 22 - 32 mmol/L   Glucose, Bld 170 (H) 70 - 99 mg/dL    Comment: Glucose reference range applies only to samples taken after fasting for at least 8 hours.   BUN 16 6 - 20 mg/dL   Creatinine, Ser 0.97 0.44 - 1.00 mg/dL   Calcium 8.9 8.9 - 10.3 mg/dL   Total Protein 6.9 6.5 - 8.1 g/dL  Albumin 4.0 3.5 - 5.0 g/dL   AST 21 15 - 41 U/L   ALT 12 0 - 44 U/L   Alkaline Phosphatase 84 38 - 126 U/L   Total Bilirubin 0.5 0.3 - 1.2 mg/dL   GFR, Estimated >60 >60 mL/min    Comment: (NOTE) Calculated using the CKD-EPI Creatinine Equation (2021)    Anion gap 8 5 - 15    Comment: Performed at [facility], [address] [place], [place] [address]  Rad Sandria Senter Session Summary     Status: None   Collection  Time: 08/17/22  1:33 PM  Result Value Ref Range   Course ID [email or web address]    Course Intent Unknown    Course Start Date 07/19/2022 11:59 AM    Session Number 13    Course First Treatment Date 07/27/2022  1:23 PM    Course Last Treatment Date 08/17/2022  1:32 PM    Course Elapsed Days 21    Reference Point ID Pelvis DP    Reference Point Dosage Given to Date 23.4 Gy   Reference Point Session Dosage Given 1.8 Gy   Plan ID Pelvis    Plan Name Pelvis    Plan Fractions Treated to Date 13    Plan Total Fractions Prescribed 25    Plan Prescribed Dose Per Fraction 1.8 Gy   Plan Total Prescribed Dose 45.000000 Gy   Plan Primary Reference Point Pelvis DP   Rad Onc Aria Session Summary     Status: None   Collection Time: 08/18/22  1:20 PM  Result Value Ref Range   Course ID [email or web address]    Course Intent Unknown    Course Start Date 07/19/2022 11:59 AM    Session Number 14    Course First Treatment Date 07/27/2022  1:23 PM    Course Last Treatment Date 08/18/2022  1:20 PM    Course Elapsed Days 22    Reference Point ID Pelvis DP    Reference Point Dosage Given to Date 25.2 Gy   Reference Point Session Dosage Given 1.8 Gy   Plan ID Pelvis    Plan Name Pelvis    Plan Fractions Treated to Date 14    Plan Total Fractions Prescribed 25    Plan Prescribed Dose Per Fraction 1.8 Gy   Plan Total Prescribed Dose 45.000000 Gy   Plan Primary Reference Point Pelvis DP   Rad Onc Aria Session Summary     Status: None   Collection Time: 08/22/22  1:18 PM  Result Value Ref Range   Course ID [email or web address]    Course Intent Unknown    Course Start Date 07/19/2022 11:59 AM    Session Number 15    Course First Treatment Date 07/27/2022  1:23 PM    Course Last Treatment Date 08/22/2022  1:18 PM    Course Elapsed Days 26    Reference Point ID Pelvis DP    Reference Point Dosage Given to Date 27 Gy   Reference Point Session Dosage Given 1.8 Gy   Plan ID Pelvis    Plan Name Pelvis    Plan Fractions Treated to Date 15     Plan Total Fractions Prescribed 25    Plan Prescribed Dose Per Fraction 1.8 Gy   Plan Total Prescribed Dose 45.000000 Gy   Plan Primary Reference Point Pelvis DP   Rad Onc Aria Session Summary     Status: None   Collection Time: 08/23/22  2:11 PM  Result Value Ref Range  Course ID [email or web address]    Course Intent Unknown    Course Start Date 07/19/2022 11:59 AM    Session Number 16    Course First Treatment Date 07/27/2022  1:23 PM    Course Last Treatment Date 08/23/2022  2:10 PM    Course Elapsed Days 27    Reference Point ID Pelvis DP    Reference Point Dosage Given to Date 28.8 Gy   Reference Point Session Dosage Given 1.8 Gy   Plan ID Pelvis    Plan Name Pelvis    Plan Fractions Treated to Date 37    Plan Total Fractions Prescribed 25    Plan Prescribed Dose Per Fraction 1.8 Gy   Plan Total Prescribed Dose 45.000000 Gy   Plan Primary Reference Point Pelvis DP   CBC with Differential/Platelet     Status: Abnormal   Collection Time: 08/24/22 10:13 AM  Result Value Ref Range   WBC 7.5 4.0 - 10.5 K/uL   RBC 4.03 3.87 - 5.11 MIL/uL   Hemoglobin 13.4 12.0 - 15.0 g/dL   HCT 39.7 36.0 - 46.0 %   MCV 98.5 80.0 - 100.0 fL   MCH 33.3 26.0 - 34.0 pg   MCHC 33.8 30.0 - 36.0 g/dL   RDW 18.9 (H) 11.5 - 15.5 %   Platelets 110 (L) 150 - 400 K/uL   nRBC 0.0 0.0 - 0.2 %   Neutrophils Relative % 77 %   Neutro Abs 5.8 1.7 - 7.7 K/uL   Lymphocytes Relative 10 %   Lymphs Abs 0.7 0.7 - 4.0 K/uL   Monocytes Relative 8 %   Monocytes Absolute 0.6 0.1 - 1.0 K/uL   Eosinophils Relative 4 %   Eosinophils Absolute 0.3 0.0 - 0.5 K/uL   Basophils Relative 1 %   Basophils Absolute 0.1 0.0 - 0.1 K/uL   Immature Granulocytes 0 %   Abs Immature Granulocytes 0.02 0.00 - 0.07 K/uL    Comment: Performed at [facility], [address] [place], [place] [address]  Comprehensive metabolic panel     Status: Abnormal   Collection Time: 08/24/22 10:13 AM  Result Value Ref Range   Sodium 136 135 - 145  mmol/L   Potassium 3.6 3.5 - 5.1 mmol/L   Chloride 102 98 - 111 mmol/L   CO2 26 22 - 32 mmol/L   Glucose, Bld 118 (H) 70 - 99 mg/dL    Comment: Glucose reference range applies only to samples taken after fasting for at least 8 hours.   BUN 11 6 - 20 mg/dL   Creatinine, Ser 0.85 0.44 - 1.00 mg/dL   Calcium 9.1 8.9 - 10.3 mg/dL   Total Protein 7.1 6.5 - 8.1 g/dL   Albumin 3.9 3.5 - 5.0 g/dL   AST 17 15 - 41 U/L   ALT 11 0 - 44 U/L   Alkaline Phosphatase 78 38 - 126 U/L   Total Bilirubin 0.4 0.3 - 1.2 mg/dL   GFR, Estimated >60 >60 mL/min    Comment: (NOTE) Calculated using the CKD-EPI Creatinine Equation (2021)    Anion gap 8 5 - 15    Comment: Performed at [facility], [address], [place],  [address]  Rad Sandria Senter Session Summary     Status: None   Collection Time: 08/24/22 11:22 AM  Result Value Ref Range   Course ID [email or web address]    Course Intent Unknown    Course Start Date 07/19/2022 11:59 AM    Session  Number 17    Course First Treatment Date 07/27/2022  1:23 PM    Course Last Treatment Date 08/24/2022 11:22 AM    Course Elapsed Days 28    Reference Point ID Pelvis DP    Reference Point Dosage Given to Date 30.6 Gy   Reference Point Session Dosage Given 1.8 Gy   Plan ID Pelvis    Plan Name Pelvis    Plan Fractions Treated to Date 17    Plan Total Fractions Prescribed 25    Plan Prescribed Dose Per Fraction 1.8 Gy   Plan Total Prescribed Dose 45.000000 Gy   Plan Primary Reference Point Pelvis DP     Assessment & Plan:    1. Encounter for Medicare annual wellness exam  Goals are : get stronger and go for walks for 20 minutes walks three days a week by Spring and after that increase to 30 minutes 5 days a week  Use inhaler twice daily ( sometimes she forgets)   Keep up with her health and doctors visits   Drink 5- 6 glasses per day   Contemplating quitting smoking   - Discussed health benefits of physical activity, and encouraged her to engage in  regular exercise appropriate for her age and condition.   Immunization History  Administered Date(s) Administered   Influenza,inj,Quad PF,6+ Mos 09/02/2018, 09/24/2019, 11/03/2020   PPD Test 05/10/2010   Pneumococcal Polysaccharide-23 05/14/2018   Tdap 06/30/2015   Zoster Recombinat (Shingrix) 02/14/2021    Health Maintenance  Topic Date Due   OPHTHALMOLOGY EXAM  Never done   INFLUENZA VACCINE  07/25/2022   MAMMOGRAM  11/03/2022   HEMOGLOBIN A1C  01/17/2023   PAP SMEAR-Modifier  02/14/2023   FOOT EXAM  07/12/2023   TETANUS/TDAP  06/29/2025   COLONOSCOPY (Pts 45-44yr Insurance coverage will need to be confirmed)  02/10/2032   Hepatitis C Screening  Completed   HIV Screening  Completed   HPV VACCINES  Aged Out   COVID-19 Vaccine  Discontinued    No orders of the defined types were placed in this encounter.   Current Outpatient Medications:    acetaminophen (TYLENOL) 325 MG tablet, Take 650 mg by mouth every 6 (six) hours as needed for headache (pain)., Disp: , Rfl:    albuterol (VENTOLIN HFA) 108 (90 Base) MCG/ACT inhaler, Inhale 2 puffs into the lungs every 6 (six) hours as needed for wheezing or shortness of breath., Disp: 1 each, Rfl: 5   amantadine (SYMMETREL) 100 MG capsule, Take 100 mg by mouth 2 (two) times daily., Disp: , Rfl:    blood glucose meter kit and supplies, Dispense based on patient and insurance preference. Use up to four times daily as directed. (FOR ICD-10 E10.9, E11.9)., Disp: 1 each, Rfl: 0   Budeson-Glycopyrrol-Formoterol (BREZTRI AEROSPHERE) 160-9-4.8 MCG/ACT AERO, Inhale 2 puffs into the lungs in the morning and at bedtime., Disp: 5.9 g, Rfl: 11   buPROPion (WELLBUTRIN XL) 150 MG 24 hr tablet, Take 150 mg by mouth every morning., Disp: , Rfl:    capecitabine (XELODA) 150 MG tablet, Take 300 mg by mouth 2 (two) times daily after a meal. Take along with 5068mtablets. Take Monday- Friday. Take only on days of radiation., Disp: , Rfl:    capecitabine  (XELODA) 500 MG tablet, Take 2 tablets (1,000 mg total) by mouth 2 (two) times daily after a meal. Take along with 15053mablets. Take Monday- Friday. Take only on days of radiation., Disp: 112 tablet, Rfl: 0   docusate  sodium (COLACE) 100 MG capsule, Take 1 capsule (100 mg total) by mouth 2 (two) times daily., Disp: 60 capsule, Rfl: 1   FARXIGA 10 MG TABS tablet, TAKE 1 TABLET BY MOUTH DAILY BEFORE BREAKFAST., Disp: 30 tablet, Rfl: 2   gabapentin (NEURONTIN) 300 MG capsule, Take 300 mg by mouth 2 (two) times daily., Disp: , Rfl:    hydrocortisone cream 0.5 %, Apply 1 Application topically 2 (two) times daily as needed for itching., Disp: 30 g, Rfl: 1   Hydrocortisone, Perianal, 1 % CREA, Apply topically., Disp: , Rfl:    hydrOXYzine (VISTARIL) 50 MG capsule, Take 50 mg by mouth 3 (three) times daily as needed for anxiety., Disp: , Rfl:    icosapent Ethyl (VASCEPA) 1 g capsule, TAKE 2 CAPSULES BY MOUTH 2 TIMES DAILY, Disp: 120 capsule, Rfl: 3   levothyroxine (SYNTHROID) 75 MCG tablet, TAKE 1 TABLET BY MOUTH DAILY BEFORE BREAKFAST, Disp: 90 tablet, Rfl: 0   lidocaine-prilocaine (EMLA) cream, Apply to affected area once, Disp: 30 g, Rfl: 3   loratadine (CLARITIN) 10 MG tablet, Take 10 mg by mouth every morning., Disp: , Rfl:    losartan (COZAAR) 25 MG tablet, Take 1 tablet (25 mg total) by mouth at bedtime., Disp: 30 tablet, Rfl: 3   NON FORMULARY, Pt uses a cpap nightly, Disp: , Rfl:    nystatin (MYCOSTATIN) 100000 UNIT/ML suspension, Take 5 mLs (500,000 Units total) by mouth 4 (four) times daily. Swish and spit, Disp: 60 mL, Rfl: 0   ondansetron (ZOFRAN) 8 MG tablet, Take 1 tablet (8 mg total) by mouth 2 (two) times daily as needed for refractory nausea / vomiting. Start on day 3 after chemotherapy., Disp: 30 tablet, Rfl: 1   paliperidone (INVEGA SUSTENNA) 234 MG/1.5ML SUSY injection, Inject 234 mg into the muscle every 30 (thirty) days. On or about the 14th of each month, Disp: , Rfl:     pantoprazole (PROTONIX) 40 MG tablet, TAKE 1 TABLET BY MOUTH DAILY, Disp: 90 tablet, Rfl: 0   Pediatric Multivit-Minerals-C (CHEWABLES MULTIVITAMIN PO), Take 1 tablet by mouth daily., Disp: , Rfl:    pioglitazone (ACTOS) 15 MG tablet, Take 1 tablet (15 mg total) by mouth daily., Disp: 30 tablet, Rfl: 3   prochlorperazine (COMPAZINE) 10 MG tablet, Take 1 tablet (10 mg total) by mouth every 6 (six) hours as needed for nausea or vomiting., Disp: 60 tablet, Rfl: 3   rosuvastatin (CRESTOR) 40 MG tablet, Take 1 tablet (40 mg total) by mouth every morning., Disp: 30 tablet, Rfl: 3   sertraline (ZOLOFT) 100 MG tablet, Take 200 mg by mouth every morning., Disp: , Rfl:    Spacer/Aero-Holding Chambers (AEROCHAMBER MV) inhaler, Use as instructed, Disp: 1 each, Rfl: 0   Cholecalciferol (VITAMIN D-3) 125 MCG (5000 UT) TABS, Take 5,000 Units by mouth daily. (Patient not taking: Reported on 08/25/2022), Disp: 30 tablet, Rfl: 1 There are no discontinued medications.  I have personally reviewed and addressed the Medicare Annual Wellness health risk assessment questionnaire and have noted the following in the patient's chart:  A.         Medical and social history & family history B.         Use of alcohol, tobacco, and illicit drugs  C.         Current medications and supplements D.         Functional and Cognitive ability and status E.         Nutritional status F.  Physical activity G.        Advance directives H.         List of other physicians I.          Hospitalizations, surgeries, and ER visits in previous 12 months J.         Hico such as hearing, vision, cognitive function, and depression L.         Referrals and appointments: mammogram to be done end of year, should have had MRI but it was not done, breast drainage stopped. She will contact eye center   In addition, I have reviewed and discussed with patient certain preventive protocols, quality metrics, and best  practice recommendations. A written personalized care plan for preventive services as well as general preventive health recommendations were provided to patient.   See attached scanned questionnaire for additional information.

## 2022-08-25 ENCOUNTER — Ambulatory Visit (INDEPENDENT_AMBULATORY_CARE_PROVIDER_SITE_OTHER): Payer: Medicare Other | Admitting: Family Medicine

## 2022-08-25 ENCOUNTER — Inpatient Hospital Stay: Payer: Medicare Other

## 2022-08-25 ENCOUNTER — Ambulatory Visit
Admission: RE | Admit: 2022-08-25 | Discharge: 2022-08-25 | Disposition: A | Discharge status: Home / Self Care | Payer: Medicare Other | Source: Ambulatory Visit | Attending: Radiation Oncology | Admitting: Radiation Oncology

## 2022-08-25 ENCOUNTER — Encounter: Payer: Self-pay | Admitting: Family Medicine

## 2022-08-25 ENCOUNTER — Other Ambulatory Visit: Payer: Self-pay

## 2022-08-25 VITALS — BP 116/72 | HR 96 | Resp 16 | Ht 59.0 in | Wt 133.0 lb

## 2022-08-25 DIAGNOSIS — N200 Calculus of kidney: Secondary | ICD-10-CM | POA: Insufficient documentation

## 2022-08-25 DIAGNOSIS — Z800 Family history of malignant neoplasm of digestive organs: Secondary | ICD-10-CM | POA: Insufficient documentation

## 2022-08-25 DIAGNOSIS — F17210 Nicotine dependence, cigarettes, uncomplicated: Secondary | ICD-10-CM | POA: Insufficient documentation

## 2022-08-25 DIAGNOSIS — G4730 Sleep apnea, unspecified: Secondary | ICD-10-CM | POA: Insufficient documentation

## 2022-08-25 DIAGNOSIS — J449 Chronic obstructive pulmonary disease, unspecified: Secondary | ICD-10-CM | POA: Insufficient documentation

## 2022-08-25 DIAGNOSIS — E1122 Type 2 diabetes mellitus with diabetic chronic kidney disease: Secondary | ICD-10-CM | POA: Insufficient documentation

## 2022-08-25 DIAGNOSIS — C20 Malignant neoplasm of rectum: Secondary | ICD-10-CM | POA: Insufficient documentation

## 2022-08-25 DIAGNOSIS — F319 Bipolar disorder, unspecified: Secondary | ICD-10-CM | POA: Insufficient documentation

## 2022-08-25 DIAGNOSIS — D696 Thrombocytopenia, unspecified: Secondary | ICD-10-CM | POA: Insufficient documentation

## 2022-08-25 DIAGNOSIS — Z79890 Hormone replacement therapy (postmenopausal): Secondary | ICD-10-CM | POA: Insufficient documentation

## 2022-08-25 DIAGNOSIS — Z87442 Personal history of urinary calculi: Secondary | ICD-10-CM | POA: Insufficient documentation

## 2022-08-25 DIAGNOSIS — D122 Benign neoplasm of ascending colon: Secondary | ICD-10-CM | POA: Insufficient documentation

## 2022-08-25 DIAGNOSIS — I129 Hypertensive chronic kidney disease with stage 1 through stage 4 chronic kidney disease, or unspecified chronic kidney disease: Secondary | ICD-10-CM | POA: Insufficient documentation

## 2022-08-25 DIAGNOSIS — Z510 Encounter for antineoplastic radiation therapy: Secondary | ICD-10-CM | POA: Insufficient documentation

## 2022-08-25 DIAGNOSIS — Z79899 Other long term (current) drug therapy: Secondary | ICD-10-CM | POA: Insufficient documentation

## 2022-08-25 DIAGNOSIS — Z7984 Long term (current) use of oral hypoglycemic drugs: Secondary | ICD-10-CM | POA: Insufficient documentation

## 2022-08-25 DIAGNOSIS — K219 Gastro-esophageal reflux disease without esophagitis: Secondary | ICD-10-CM | POA: Insufficient documentation

## 2022-08-25 DIAGNOSIS — F209 Schizophrenia, unspecified: Secondary | ICD-10-CM | POA: Insufficient documentation

## 2022-08-25 DIAGNOSIS — I700 Atherosclerosis of aorta: Secondary | ICD-10-CM | POA: Insufficient documentation

## 2022-08-25 DIAGNOSIS — Z1231 Encounter for screening mammogram for malignant neoplasm of breast: Secondary | ICD-10-CM

## 2022-08-25 DIAGNOSIS — E039 Hypothyroidism, unspecified: Secondary | ICD-10-CM | POA: Insufficient documentation

## 2022-08-25 DIAGNOSIS — Z0000 Encounter for general adult medical examination without abnormal findings: Secondary | ICD-10-CM

## 2022-08-25 DIAGNOSIS — Z803 Family history of malignant neoplasm of breast: Secondary | ICD-10-CM | POA: Insufficient documentation

## 2022-08-25 DIAGNOSIS — E785 Hyperlipidemia, unspecified: Secondary | ICD-10-CM | POA: Insufficient documentation

## 2022-08-25 DIAGNOSIS — N189 Chronic kidney disease, unspecified: Secondary | ICD-10-CM | POA: Insufficient documentation

## 2022-08-25 LAB — RAD ONC ARIA SESSION SUMMARY
Course Elapsed Days: 29
Plan Fractions Treated to Date: 18
Plan Prescribed Dose Per Fraction: 1.8 Gy
Plan Total Fractions Prescribed: 25
Plan Total Prescribed Dose: 45 Gy
Reference Point Dosage Given to Date: 32.4 Gy
Reference Point Session Dosage Given: 1.8 Gy
Session Number: 18

## 2022-08-25 NOTE — Progress Notes (Signed)
Nutrition  Patient did not show up for nutrition visit today.    RD tried to call and speak with patient via phone but no answer or option to leave voicemail.  RD to follow-up on 9/26 during infusion  Vivian Neuwirth B. Zenia Resides, [place], [place] Registered Dietitian [phone]

## 2022-08-26 ENCOUNTER — Encounter: Payer: Self-pay | Admitting: Oncology

## 2022-08-26 ENCOUNTER — Other Ambulatory Visit: Payer: Self-pay

## 2022-08-26 NOTE — Addendum Note (Signed)
Addended by: [name] on: 08/26/2022 08:42 PM   Modules accepted: Orders

## 2022-08-29 ENCOUNTER — Ambulatory Visit: Payer: Medicare Other

## 2022-08-29 ENCOUNTER — Inpatient Hospital Stay: Payer: Medicare Other

## 2022-08-29 DIAGNOSIS — Z510 Encounter for antineoplastic radiation therapy: Secondary | ICD-10-CM | POA: Diagnosis not present

## 2022-08-30 ENCOUNTER — Other Ambulatory Visit: Payer: Self-pay

## 2022-08-30 ENCOUNTER — Ambulatory Visit
Admission: RE | Admit: 2022-08-30 | Discharge: 2022-08-30 | Disposition: A | Discharge status: Home / Self Care | Payer: Medicare Other | Source: Ambulatory Visit | Attending: Radiation Oncology | Admitting: Radiation Oncology

## 2022-08-30 ENCOUNTER — Inpatient Hospital Stay: Payer: Medicare Other

## 2022-08-30 DIAGNOSIS — Z510 Encounter for antineoplastic radiation therapy: Secondary | ICD-10-CM | POA: Diagnosis not present

## 2022-08-30 LAB — RAD ONC ARIA SESSION SUMMARY
Course Elapsed Days: 34
Plan Fractions Treated to Date: 19
Plan Prescribed Dose Per Fraction: 1.8 Gy
Plan Total Fractions Prescribed: 25
Plan Total Prescribed Dose: 45 Gy
Reference Point Dosage Given to Date: 34.2 Gy
Reference Point Session Dosage Given: 1.8 Gy
Session Number: 19

## 2022-08-31 ENCOUNTER — Other Ambulatory Visit: Payer: Self-pay

## 2022-08-31 ENCOUNTER — Inpatient Hospital Stay: Payer: Medicare Other

## 2022-08-31 ENCOUNTER — Ambulatory Visit: Payer: Medicare Other

## 2022-08-31 ENCOUNTER — Ambulatory Visit
Admission: RE | Admit: 2022-08-31 | Discharge: 2022-08-31 | Disposition: A | Discharge status: Home / Self Care | Payer: Medicare Other | Source: Ambulatory Visit | Attending: Radiation Oncology | Admitting: Radiation Oncology

## 2022-08-31 DIAGNOSIS — Z510 Encounter for antineoplastic radiation therapy: Secondary | ICD-10-CM | POA: Diagnosis not present

## 2022-08-31 LAB — RAD ONC ARIA SESSION SUMMARY
Course Elapsed Days: 35
Plan Fractions Treated to Date: 20
Plan Prescribed Dose Per Fraction: 1.8 Gy
Plan Total Fractions Prescribed: 25
Plan Total Prescribed Dose: 45 Gy
Reference Point Dosage Given to Date: 36 Gy
Reference Point Session Dosage Given: 1.8 Gy
Session Number: 20

## 2022-09-01 ENCOUNTER — Other Ambulatory Visit: Payer: Self-pay

## 2022-09-01 ENCOUNTER — Inpatient Hospital Stay: Payer: Medicare Other

## 2022-09-01 ENCOUNTER — Ambulatory Visit
Admission: RE | Admit: 2022-09-01 | Discharge: 2022-09-01 | Disposition: A | Discharge status: Home / Self Care | Payer: Medicare Other | Source: Ambulatory Visit | Attending: Radiation Oncology | Admitting: Radiation Oncology

## 2022-09-01 DIAGNOSIS — Z510 Encounter for antineoplastic radiation therapy: Secondary | ICD-10-CM | POA: Diagnosis not present

## 2022-09-01 LAB — RAD ONC ARIA SESSION SUMMARY
Course Elapsed Days: 36
Plan Fractions Treated to Date: 21
Plan Prescribed Dose Per Fraction: 1.8 Gy
Plan Total Fractions Prescribed: 25
Plan Total Prescribed Dose: 45 Gy
Reference Point Dosage Given to Date: 37.8 Gy
Reference Point Session Dosage Given: 1.8 Gy
Session Number: 21

## 2022-09-04 ENCOUNTER — Encounter: Payer: Self-pay | Admitting: *Deleted

## 2022-09-04 ENCOUNTER — Other Ambulatory Visit (HOSPITAL_COMMUNITY): Payer: Self-pay

## 2022-09-04 ENCOUNTER — Inpatient Hospital Stay: Payer: Medicare Other

## 2022-09-04 ENCOUNTER — Ambulatory Visit
Admission: RE | Admit: 2022-09-04 | Discharge: 2022-09-04 | Disposition: A | Discharge status: Home / Self Care | Payer: Medicare Other | Source: Ambulatory Visit | Attending: Radiation Oncology | Admitting: Radiation Oncology

## 2022-09-04 ENCOUNTER — Other Ambulatory Visit: Payer: Self-pay

## 2022-09-04 DIAGNOSIS — Z510 Encounter for antineoplastic radiation therapy: Secondary | ICD-10-CM | POA: Diagnosis not present

## 2022-09-04 LAB — RAD ONC ARIA SESSION SUMMARY
Course Elapsed Days: 39
Plan Fractions Treated to Date: 22
Plan Prescribed Dose Per Fraction: 1.8 Gy
Plan Total Fractions Prescribed: 25
Plan Total Prescribed Dose: 45 Gy
Reference Point Dosage Given to Date: 39.6 Gy
Reference Point Session Dosage Given: 1.8 Gy
Session Number: 22

## 2022-09-05 ENCOUNTER — Other Ambulatory Visit: Payer: Self-pay

## 2022-09-05 ENCOUNTER — Ambulatory Visit: Payer: Medicare Other

## 2022-09-05 ENCOUNTER — Inpatient Hospital Stay: Payer: Medicare Other

## 2022-09-05 ENCOUNTER — Ambulatory Visit
Admission: RE | Admit: 2022-09-05 | Discharge: 2022-09-05 | Disposition: A | Discharge status: Home / Self Care | Payer: Medicare Other | Source: Ambulatory Visit | Attending: Radiation Oncology | Admitting: Radiation Oncology

## 2022-09-05 DIAGNOSIS — Z510 Encounter for antineoplastic radiation therapy: Secondary | ICD-10-CM | POA: Diagnosis not present

## 2022-09-05 LAB — RAD ONC ARIA SESSION SUMMARY
Course Elapsed Days: 40
Plan Fractions Treated to Date: 23
Plan Prescribed Dose Per Fraction: 1.8 Gy
Plan Total Fractions Prescribed: 25
Plan Total Prescribed Dose: 45 Gy
Reference Point Dosage Given to Date: 41.4 Gy
Reference Point Session Dosage Given: 1.8 Gy
Session Number: 23

## 2022-09-06 ENCOUNTER — Encounter: Payer: Self-pay | Admitting: *Deleted

## 2022-09-06 ENCOUNTER — Other Ambulatory Visit: Payer: Self-pay

## 2022-09-06 ENCOUNTER — Inpatient Hospital Stay: Payer: Medicare Other

## 2022-09-06 ENCOUNTER — Ambulatory Visit: Payer: Medicare Other

## 2022-09-06 ENCOUNTER — Ambulatory Visit
Admission: RE | Admit: 2022-09-06 | Discharge: 2022-09-06 | Disposition: A | Discharge status: Home / Self Care | Payer: Medicare Other | Source: Ambulatory Visit | Attending: Radiation Oncology | Admitting: Radiation Oncology

## 2022-09-06 DIAGNOSIS — Z510 Encounter for antineoplastic radiation therapy: Secondary | ICD-10-CM | POA: Diagnosis not present

## 2022-09-06 LAB — RAD ONC ARIA SESSION SUMMARY
Course Elapsed Days: 41
Plan Fractions Treated to Date: 24
Plan Prescribed Dose Per Fraction: 1.8 Gy
Plan Total Fractions Prescribed: 25
Plan Total Prescribed Dose: 45 Gy
Reference Point Dosage Given to Date: 43.2 Gy
Reference Point Session Dosage Given: 1.8 Gy
Session Number: 24

## 2022-09-07 ENCOUNTER — Inpatient Hospital Stay: Payer: Medicare Other

## 2022-09-07 ENCOUNTER — Ambulatory Visit: Payer: Medicare Other

## 2022-09-07 ENCOUNTER — Ambulatory Visit
Admission: RE | Admit: 2022-09-07 | Discharge: 2022-09-07 | Disposition: A | Discharge status: Home / Self Care | Payer: Medicare Other | Source: Ambulatory Visit | Attending: Radiation Oncology | Admitting: Radiation Oncology

## 2022-09-07 ENCOUNTER — Ambulatory Visit: Admission: RE | Admit: 2022-09-07 | Payer: Medicare Other | Source: Ambulatory Visit

## 2022-09-07 ENCOUNTER — Encounter: Payer: Self-pay | Admitting: *Deleted

## 2022-09-07 ENCOUNTER — Other Ambulatory Visit: Payer: Self-pay

## 2022-09-07 ENCOUNTER — Encounter: Payer: Self-pay | Admitting: Oncology

## 2022-09-07 ENCOUNTER — Inpatient Hospital Stay (HOSPITAL_BASED_OUTPATIENT_CLINIC_OR_DEPARTMENT_OTHER): Payer: Medicare Other | Admitting: Oncology

## 2022-09-07 DIAGNOSIS — Z5111 Encounter for antineoplastic chemotherapy: Secondary | ICD-10-CM

## 2022-09-07 DIAGNOSIS — C20 Malignant neoplasm of rectum: Secondary | ICD-10-CM

## 2022-09-07 DIAGNOSIS — D696 Thrombocytopenia, unspecified: Secondary | ICD-10-CM | POA: Diagnosis not present

## 2022-09-07 DIAGNOSIS — Z720 Tobacco use: Secondary | ICD-10-CM | POA: Diagnosis not present

## 2022-09-07 DIAGNOSIS — Z510 Encounter for antineoplastic radiation therapy: Secondary | ICD-10-CM | POA: Diagnosis not present

## 2022-09-07 LAB — RAD ONC ARIA SESSION SUMMARY
Course Elapsed Days: 42
Plan Fractions Treated to Date: 25
Plan Prescribed Dose Per Fraction: 1.8 Gy
Plan Total Fractions Prescribed: 25
Plan Total Prescribed Dose: 45 Gy
Reference Point Dosage Given to Date: 45 Gy
Reference Point Session Dosage Given: 1.8 Gy
Session Number: 25

## 2022-09-07 LAB — CBC WITH DIFFERENTIAL/PLATELET
Abs Immature Granulocytes: 0.02 10*3/uL (ref 0.00–0.07)
Basophils Absolute: 0 10*3/uL (ref 0.0–0.1)
Basophils Relative: 1 %
Eosinophils Absolute: 0.3 10*3/uL (ref 0.0–0.5)
Eosinophils Relative: 4 %
HCT: 42.1 % (ref 36.0–46.0)
Hemoglobin: 14.5 g/dL (ref 12.0–15.0)
Immature Granulocytes: 0 %
Lymphocytes Relative: 9 %
Lymphs Abs: 0.6 10*3/uL — ABNORMAL LOW (ref 0.7–4.0)
MCH: 34.3 pg — ABNORMAL HIGH (ref 26.0–34.0)
MCHC: 34.4 g/dL (ref 30.0–36.0)
MCV: 99.5 fL (ref 80.0–100.0)
Monocytes Absolute: 0.5 10*3/uL (ref 0.1–1.0)
Monocytes Relative: 8 %
Neutro Abs: 5.1 10*3/uL (ref 1.7–7.7)
Neutrophils Relative %: 78 %
Platelets: 140 10*3/uL — ABNORMAL LOW (ref 150–400)
RBC: 4.23 MIL/uL (ref 3.87–5.11)
RDW: 19.1 % — ABNORMAL HIGH (ref 11.5–15.5)
WBC: 6.5 10*3/uL (ref 4.0–10.5)
nRBC: 0 % (ref 0.0–0.2)

## 2022-09-07 LAB — COMPREHENSIVE METABOLIC PANEL
ALT: 13 U/L (ref 0–44)
AST: 24 U/L (ref 15–41)
Albumin: 4.2 g/dL (ref 3.5–5.0)
Alkaline Phosphatase: 88 U/L (ref 38–126)
Anion gap: 9 (ref 5–15)
BUN: 12 mg/dL (ref 6–20)
CO2: 26 mmol/L (ref 22–32)
Calcium: 9.1 mg/dL (ref 8.9–10.3)
Chloride: 104 mmol/L (ref 98–111)
Creatinine, Ser: 1.06 mg/dL — ABNORMAL HIGH (ref 0.44–1.00)
GFR, Estimated: >60 mL/min (ref >60)
Glucose, Bld: 170 mg/dL — ABNORMAL HIGH (ref 70–99)
Potassium: 3.5 mmol/L (ref 3.5–5.1)
Sodium: 139 mmol/L (ref 135–145)
Total Bilirubin: 0.4 mg/dL (ref 0.3–1.2)
Total Protein: 7.6 g/dL (ref 6.5–8.1)

## 2022-09-07 MED ORDER — SODIUM CHLORIDE 0.9% FLUSH
10.0000 mL | Freq: Once | INTRAVENOUS | Status: AC | PRN
  Administered 2022-09-07: 10 mL
  Filled 2022-09-07: qty 10

## 2022-09-07 MED ORDER — HEPARIN SOD (PORK) LOCK FLUSH 100 UNIT/ML IV SOLN
500.0000 [IU] | Freq: Once | INTRAVENOUS | Status: AC | PRN
  Administered 2022-09-07: 500 [IU]
  Filled 2022-09-07: qty 5

## 2022-09-07 MED ORDER — SODIUM CHLORIDE 0.9 % IV SOLN
INTRAVENOUS | Status: DC
  Filled 2022-09-07 (×2): qty 250

## 2022-09-07 NOTE — Assessment & Plan Note (Signed)
Chemotherapy plan as listed above 

## 2022-09-07 NOTE — Assessment & Plan Note (Signed)
Smoke cessation recommended 

## 2022-09-07 NOTE — Progress Notes (Signed)
Hematology/Oncology Progress note [phone] [phone]            Patient Care Team: Steele Sizer, MD as PCP - General (Family Medicine) Bjorn Loser, MD as Consulting Physician (Urology) Germaine Pomfret, [facility] (Pharmacist) Clent Jacks, RN as Oncology Nurse Navigator [name], MD as Consulting Physician (Oncology) Noreene Filbert, MD as Consulting Physician (Radiation Oncology) [place]. (Psychiatry) Chesley Mires, MD as Consulting Physician (Pulmonary Disease) Ileana Roup, MD as Consulting Physician (General Surgery) Lin Landsman, MD as Consulting Physician (Gastroenterology)   ASSESSMENT & PLAN:   Cancer Staging  Rectal cancer [facility]) Staging form: Colon and Rectum, AJCC 8th Edition - Pathologic stage from 05/03/2022: Stage IIIB (pT3, pN1b, cM0) - Signed by [name], MD on 05/03/2022   Rectal cancer ([place]) Stage III. pT3 pN1b cM0, s/p APR. Currently on adjuvant chemotherapy. S/p FOLFOX x 4 cycles Labs reviewed and discussed with patient Continue Xeloda 1300 mg twice daily.  Overall she tolerates well. Final RT 9/19 Proceed with IV fluid normal saline 1 L x 1.   Encounter for antineoplastic chemotherapy Chemotherapy plan as listed above  Thrombocytopenia (HCC) Trending low, close monitor.  Tobacco use Smoke cessation recommended.    No orders of the defined types were placed in this encounter.   Follow-up  3 weeks lab MD [facility]  All questions were answered. The patient knows to call the clinic with any problems, questions or concerns.  [name], MD, PhD [facility] Hematology Oncology 09/07/2022      CHIEF COMPLAINTS/REASON FOR VISIT:  Follow-up for rectal cancer treatments.  HISTORY OF PRESENTING ILLNESS:   April Luna is a  51 y.o.  female presents for treatment of rectal cancer Oncology History  Rectal cancer ([place])  02/26/2022 Imaging   MRI PELVIS  WITHOUT CONTRAST- By imaging, rectal cancer stage:  T1/T2, N0, Mx    03/02/2022 Imaging   CT CHEST AND ABDOMEN WITH CONTRAST 1. No convincing evidence of metastatic disease within the chest or abdomen. 2. Atrophic left kidney with multifocal renal scarring and cortical calcifications as well as nonobstructive renal stones measuring up to 5 mm. 3. Prominent left-sided predominant retroperitoneal lymph nodes measuring up to 8 mm near the level of the renal hilum, overall decreased in size dating back to CT September 19, 2018 and favored reactive related to left renal inflammation. 4.  Aortic Atherosclerosis (ICD10-I70.0).   03/27/2022 Genetic Testing    [facility] CustomNext+RNA cancer panel found no pathogenic mutations.    04/21/2022 Initial Diagnosis   Rectal cancer - baseline CEA 3.6 -02/09/2022, patient had colonoscopy which showed renal mass 10 cm from anal verge.  5 mm polyp in ascending colon.  Removed and retrieved. Pathology showed rectal adenocarcinoma.  The polyp in the ascending colon is a tubular adenoma. -04/21/2022, patient underwent robotic assisted ultralow anterior resection. Pathology showed moderately differentiated adenocarcinoma, 4.5 cm in maximal extent, with focal extension through muscularis propria into perirectal soft tissue.  3 lymph nodes positive for metastatic carcinoma.  Negative margin.  pT3 pN1b, MSI stable.   05/03/2022 Cancer Staging   Staging form: Colon and Rectum, AJCC 8th Edition - Pathologic stage from 05/03/2022: Stage IIIB (pT3, pN1b, cM0) - Signed by [name], MD on 05/03/2022 Stage prefix: Initial diagnosis   05/11/2022 Miscellaneous   Medi port placed by Dr.White   05/26/2022 -  Chemotherapy   FOLFOX Q2 weeks x 4   05/26/2022 - 07/09/2022 Chemotherapy   Patient  is on Treatment Plan : COLORECTAL FOLFOX q14d x 8 cycles     05/26/2022 -  Chemotherapy   Patient is on Treatment Plan : COLORECTAL FOLFOX q14d x 4 months     07/27/2022 -  Chemotherapy   Concurrent  chemotherapy- Xeloda  1328m BID and radiation.    Patient has bipolar and schizophrenia..  Patient is married and lives at home with her husband and son.  Patient tolerates Xeloda chemotherapy and radiation + Constipation, patient takes Colace twice daily.  No significant increase of ostomy output. No nausea vomiting no abdominal pain.  No fever or chills.  Review of Systems  Constitutional:  Negative for appetite change, chills, fatigue and fever.  HENT:   Negative for hearing loss and voice change.   Eyes:  Negative for eye problems.  Respiratory:  Negative for chest tightness and cough.   Cardiovascular:  Negative for chest pain.  Gastrointestinal:  Negative for abdominal distention, abdominal pain and blood in stool.       Bloated  Endocrine: Negative for hot flashes.  Genitourinary:  Negative for difficulty urinating and frequency.   Musculoskeletal:  Negative for arthralgias.  Skin:  Negative for itching and rash.  Neurological:  Negative for extremity weakness.  Hematological:  Negative for adenopathy.  Psychiatric/Behavioral:  Negative for confusion.     MEDICAL HISTORY:  Past Medical History:  Diagnosis Date   Allergy    pollen   Anxiety    Arthritis    right hip   Bipolar 1 disorder (HCC)    Cancer (HCC)    rectal   Chronic kidney disease    COPD (chronic obstructive pulmonary disease) (HCC)    Depression    Family history of adverse reaction to anesthesia    grand father had a stroke during anesthesia   Family history of breast cancer    Family history of colon cancer    Family history of uterine cancer    GERD (gastroesophageal reflux disease)    History of kidney stones    Hyperlipidemia    Hypertension    Hypothyroidism    Panic attack    Pneumonia    Psoriasis    Sleep apnea 08/11/2021   No CPAP   Type 2 diabetes mellitus with microalbuminuria, without long-term current use of insulin [place] 06/24/2019    SURGICAL HISTORY: Past Surgical History:   Procedure Laterality Date   BREAST BIOPSY Left 12/14/2021   uKoreabx, venus marker, path pending   CESAREAN SECTION     COLONOSCOPY WITH PROPOFOL N/A 02/09/2022   Procedure: COLONOSCOPY WITH PROPOFOL;  Surgeon: VLin Landsman MD;  [place]  Service: Endoscopy;  Laterality: N/A;  sleep apnea   CYSTOSCOPY W/ RETROGRADES Left 11/08/2018   Procedure: CYSTOSCOPY WITH RETROGRADE PYELOGRAM;  Surgeon: SBilley Co MD;  Location: [facility];  Service: Urology;  Laterality: Left;   CYSTOSCOPY/URETEROSCOPY/HOLMIUM LASER/STENT PLACEMENT Left 11/08/2018   Procedure: CYSTOSCOPY/URETEROSCOPY/HOLMIUM LASER/STENT PLACEMENT;  Surgeon: SBilley Co MD;  Location: [facility];  Service: Urology;  Laterality: Left;   IR IMAGING GUIDED PORT INSERTION  05/11/2022   MOUTH SURGERY     wisdom teeth extraction   MOUTH SURGERY     teeth removal   POLYPECTOMY N/A 02/09/2022   Procedure: POLYPECTOMY;  Surgeon: VLin Landsman MD;  [place]  Service: Endoscopy;  Laterality: N/A;   XI ROBOTIC ASSISTED LOWER ANTERIOR RESECTION N/A 04/21/2022   Procedure: XI ROBOTIC ASSISTED LOWER ANTERIOR RESECTION WITH COLOSTOMY,  BILATERAL TAP BLOCK, ASSESSMENT OF TISSUE PERFUSSION WITH FIREFLY INJECTION;  Surgeon: Ileana Roup, MD;  Location: [facility];  Service: General;  Laterality: N/A;    SOCIAL HISTORY: Social History   Socioeconomic History   Marital status: Married    Spouse name: Montine Circle   Number of children: 1   Years of education: 12   Highest education level: High school graduate  Occupational History   Occupation: unemployed    Comment: disabled  Tobacco Use   Smoking status: Every Day    Packs/day: 0.25    Years: 34.00    Total pack years: 8.50    Types: Cigarettes    Start date: 05/13/1985   Smokeless tobacco: Never   Tobacco comments:    1 PPD 04/20/223  Vaping Use   Vaping Use: Former   Start date: 05/25/2018   Quit date: 09/24/2018   Substance and Sexual Activity   Alcohol use: Not Currently    Alcohol/week: 4.0 standard drinks of alcohol    Types: 4 Glasses of wine per week    Comment: quit ETOH in Feb. 2023   Drug use: Not Currently    Types: Marijuana    Comment: 2 days ago   Sexual activity: Not Currently    Birth control/protection: Post-menopausal  Other Topics Concern   Not on file  Social History Narrative   Lives with husband and son    Social Determinants of Health   Financial Resource Strain: High Risk (08/25/2022)   Overall Financial Resource Strain (CARDIA)    Difficulty of Paying Living Expenses: Hard  Food Insecurity: Food Insecurity Present (08/25/2022)   Hunger Vital Sign    Worried About Running Out of Food in the Last Year: Sometimes true    Ran Out of Food in the Last Year: Sometimes true  Transportation Needs: Unmet Transportation Needs (09/07/2022)   PRAPARE - Transportation    Lack of Transportation (Medical): Yes    Lack of Transportation (Non-Medical): Yes  Physical Activity: Insufficiently Active (08/25/2022)   Exercise Vital Sign    Days of Exercise per Week: 1 day    Minutes of Exercise per Session: 10 min  Stress: Stress Concern Present (08/25/2022)   [place]    Feeling of Stress : To some extent  Social Connections: Moderately Integrated (08/25/2022)   Social Connection and Isolation Panel [NHANES]    Frequency of Communication with Friends and Family: More than three times a week    Frequency of Social Gatherings with Friends and Family: Twice a week    Attends Religious Services: Never    Marine scientist or Organizations: No    Attends Music therapist: 1 to 4 times per year    Marital Status: Married  Human resources officer Violence: Not At Risk (08/25/2022)   Humiliation, Afraid, Rape, and Kick questionnaire    Fear of Current or Ex-Partner: No    Emotionally Abused: No    Physically Abused: No     Sexually Abused: No    FAMILY HISTORY: Family History  Problem Relation Age of Onset   Depression Mother    Anxiety disorder Mother    Diabetes Mother    Hypertension Mother    Hyperlipidemia Mother    Cancer Mother    Uterine cancer Mother 46   Cervical cancer Mother 44   Colon cancer Father    Depression Brother    Anxiety disorder Brother    Cancer  Maternal Aunt        unk types   Diabetes Mellitus II Maternal Grandmother    Hypercholesterolemia Maternal Grandmother    Breast cancer Maternal Grandmother    Cancer Paternal Grandmother    Diabetes Paternal Grandmother    Melanoma Paternal Grandmother    Stomach cancer Paternal Grandmother     ALLERGIES:  is allergic to metformin and related, nsaids, perphenazine, sulfa antibiotics, abilify [aripiprazole], and penicillins.  MEDICATIONS:  Current Outpatient Medications  Medication Sig Dispense Refill   acetaminophen (TYLENOL) 325 MG tablet Take 650 mg by mouth every 6 (six) hours as needed for headache (pain).     albuterol (VENTOLIN HFA) 108 (90 Base) MCG/ACT inhaler Inhale 2 puffs into the lungs every 6 (six) hours as needed for wheezing or shortness of breath. 1 each 5   amantadine (SYMMETREL) 100 MG capsule Take 100 mg by mouth 2 (two) times daily.     blood glucose meter kit and supplies Dispense based on patient and insurance preference. Use up to four times daily as directed. (FOR ICD-10 E10.9, E11.9). 1 each 0   Budeson-Glycopyrrol-Formoterol (BREZTRI AEROSPHERE) 160-9-4.8 MCG/ACT AERO Inhale 2 puffs into the lungs in the morning and at bedtime. 5.9 g 11   buPROPion (WELLBUTRIN XL) 150 MG 24 hr tablet Take 150 mg by mouth every morning.     capecitabine (XELODA) 150 MG tablet Take 300 mg by mouth 2 (two) times daily after a meal. Take along with 531m tablets. Take Monday- Friday. Take only on days of radiation.     capecitabine (XELODA) 500 MG tablet Take 2 tablets (1,000 mg total) by mouth 2 (two) times daily  after a meal. Take along with 1585mtablets. Take Monday- Friday. Take only on days of radiation. 112 tablet 0   docusate sodium (COLACE) 100 MG capsule Take 1 capsule (100 mg total) by mouth 2 (two) times daily. 60 capsule 1   FARXIGA 10 MG TABS tablet TAKE 1 TABLET BY MOUTH DAILY BEFORE BREAKFAST. 30 tablet 2   gabapentin (NEURONTIN) 300 MG capsule Take 300 mg by mouth 2 (two) times daily.     hydrocortisone cream 0.5 % Apply 1 Application topically 2 (two) times daily as needed for itching. 30 g 1   Hydrocortisone, Perianal, 1 % CREA Apply topically.     hydrOXYzine (VISTARIL) 50 MG capsule Take 50 mg by mouth 3 (three) times daily as needed for anxiety.     icosapent Ethyl (VASCEPA) 1 g capsule TAKE 2 CAPSULES BY MOUTH 2 TIMES DAILY 120 capsule 3   levothyroxine (SYNTHROID) 75 MCG tablet TAKE 1 TABLET BY MOUTH DAILY BEFORE BREAKFAST 90 tablet 0   lidocaine-prilocaine (EMLA) cream Apply to affected area once 30 g 3   loratadine (CLARITIN) 10 MG tablet Take 10 mg by mouth every morning.     losartan (COZAAR) 25 MG tablet Take 1 tablet (25 mg total) by mouth at bedtime. 30 tablet 3   NON FORMULARY Pt uses a cpap nightly     nystatin (MYCOSTATIN) 100000 UNIT/ML suspension Take 5 mLs (500,000 Units total) by mouth 4 (four) times daily. Swish and spit 60 mL 0   ondansetron (ZOFRAN) 8 MG tablet Take 1 tablet (8 mg total) by mouth 2 (two) times daily as needed for refractory nausea / vomiting. Start on day 3 after chemotherapy. 30 tablet 1   paliperidone (INVEGA SUSTENNA) 234 MG/1.5ML SUSY injection Inject 234 mg into the muscle every 30 (thirty) days. On or about the 14th  of each month     pantoprazole (PROTONIX) 40 MG tablet TAKE 1 TABLET BY MOUTH DAILY 90 tablet 0   Pediatric Multivit-Minerals-C (CHEWABLES MULTIVITAMIN PO) Take 1 tablet by mouth daily.     pioglitazone (ACTOS) 15 MG tablet Take 1 tablet (15 mg total) by mouth daily. 30 tablet 3   prochlorperazine (COMPAZINE) 10 MG tablet Take 1  tablet (10 mg total) by mouth every 6 (six) hours as needed for nausea or vomiting. 60 tablet 3   rosuvastatin (CRESTOR) 40 MG tablet Take 1 tablet (40 mg total) by mouth every morning. 30 tablet 3   sertraline (ZOLOFT) 100 MG tablet Take 200 mg by mouth every morning.     Spacer/Aero-Holding Chambers (AEROCHAMBER MV) inhaler Use as instructed 1 each 0   Cholecalciferol (VITAMIN D-3) 125 MCG (5000 UT) TABS Take 5,000 Units by mouth daily. (Patient not taking: Reported on 08/25/2022) 30 tablet 1   No current facility-administered medications for this visit.     PHYSICAL EXAMINATION: ECOG PERFORMANCE STATUS: 0 - Asymptomatic Vitals:   09/07/22 1102  BP: 106/85  Pulse: (!) 104  Resp: 18  Temp: 97.8 F (36.6 C)  SpO2: 95%    Filed Weights   09/07/22 1102  Weight: 128 lb (58.1 kg)     Physical Exam Constitutional:      General: She is not in acute distress. HENT:     Head: Normocephalic and atraumatic.  Eyes:     General: No scleral icterus. Cardiovascular:     Rate and Rhythm: Normal rate and regular rhythm.     Heart sounds: Normal heart sounds.  Pulmonary:     Effort: Pulmonary effort is normal. No respiratory distress.     Breath sounds: Wheezing present.  Abdominal:     General: Bowel sounds are normal. There is no distension.     Palpations: Abdomen is soft.     Comments: Liquid stool in ostomy bag.  Musculoskeletal:        General: No deformity. Normal range of motion.     Cervical back: Normal range of motion and neck supple.  Skin:    General: Skin is warm and dry.     Findings: No erythema or rash.  Neurological:     Mental Status: She is alert and oriented to person, place, and time. Mental status is at baseline.     Cranial Nerves: No cranial nerve deficit.     Coordination: Coordination normal.  Psychiatric:        Mood and Affect: Mood normal.     LABORATORY DATA:  I have reviewed the data as listed    Latest Ref Rng & Units 09/07/2022   10:54 AM  08/24/2022   10:13 AM 08/17/2022    9:45 AM  CBC  WBC 4.0 - 10.5 K/uL 6.5  7.5  8.0   Hemoglobin 12.0 - 15.0 g/dL 14.5  13.4  13.7   Hematocrit 36.0 - 46.0 % 42.1  39.7  40.5   Platelets 150 - 400 K/uL 140  110  108       Latest Ref Rng & Units 09/07/2022   10:54 AM 08/24/2022   10:13 AM 08/17/2022    9:45 AM  CMP  Glucose 70 - 99 mg/dL 170  118  170   BUN 6 - 20 mg/dL '12  11  16   ' Creatinine 0.44 - 1.00 mg/dL 1.06  0.85  0.97   Sodium 135 - 145 mmol/L 139  136  138  Potassium 3.5 - 5.1 mmol/L 3.5  3.6  3.3   Chloride 98 - 111 mmol/L 104  102  104   CO2 22 - 32 mmol/L '26  26  26   ' Calcium 8.9 - 10.3 mg/dL 9.1  9.1  8.9   Total Protein 6.5 - 8.1 g/dL 7.6  7.1  6.9   Total Bilirubin 0.3 - 1.2 mg/dL 0.4  0.4  0.5   Alkaline Phos 38 - 126 U/L 88  78  84   AST 15 - 41 U/L '24  17  21   ' ALT 0 - 44 U/L '13  11  12         ' RADIOGRAPHIC STUDIES: I have personally reviewed the radiological images as listed and agreed with the findings in the report. No results found.

## 2022-09-07 NOTE — Assessment & Plan Note (Signed)
Stage III. pT3 pN1b cM0, s/p APR. Currently on adjuvant chemotherapy. S/p FOLFOX x 4 cycles Labs reviewed and discussed with patient Continue Xeloda 1300 mg twice daily.  Overall she tolerates well. Final RT 9/19 Proceed with IV fluid normal saline 1 L x 1.

## 2022-09-07 NOTE — Assessment & Plan Note (Signed)
Trending low, close monitor. 

## 2022-09-08 ENCOUNTER — Ambulatory Visit: Payer: Medicare Other

## 2022-09-08 ENCOUNTER — Inpatient Hospital Stay: Payer: Medicare Other

## 2022-09-08 ENCOUNTER — Ambulatory Visit
Admission: RE | Admit: 2022-09-08 | Discharge: 2022-09-08 | Disposition: A | Discharge status: Home / Self Care | Payer: Medicare Other | Source: Ambulatory Visit | Attending: Radiation Oncology | Admitting: Radiation Oncology

## 2022-09-08 ENCOUNTER — Encounter: Payer: Self-pay | Admitting: *Deleted

## 2022-09-08 ENCOUNTER — Other Ambulatory Visit: Payer: Self-pay

## 2022-09-08 DIAGNOSIS — Z510 Encounter for antineoplastic radiation therapy: Secondary | ICD-10-CM | POA: Diagnosis not present

## 2022-09-08 LAB — RAD ONC ARIA SESSION SUMMARY
Course Elapsed Days: 43
Plan Fractions Treated to Date: 1
Plan Prescribed Dose Per Fraction: 1.8 Gy
Plan Total Fractions Prescribed: 3
Plan Total Prescribed Dose: 5.4 Gy
Reference Point Dosage Given to Date: 1.8 Gy
Reference Point Session Dosage Given: 1.8 Gy
Session Number: 26

## 2022-09-10 ENCOUNTER — Other Ambulatory Visit: Payer: Self-pay | Admitting: Family Medicine

## 2022-09-10 DIAGNOSIS — E1129 Type 2 diabetes mellitus with other diabetic kidney complication: Secondary | ICD-10-CM

## 2022-09-11 ENCOUNTER — Other Ambulatory Visit: Payer: Self-pay

## 2022-09-11 ENCOUNTER — Ambulatory Visit
Admission: RE | Admit: 2022-09-11 | Discharge: 2022-09-11 | Disposition: A | Discharge status: Home / Self Care | Payer: Medicare Other | Source: Ambulatory Visit | Attending: Radiation Oncology | Admitting: Radiation Oncology

## 2022-09-11 ENCOUNTER — Ambulatory Visit: Payer: Medicare Other

## 2022-09-11 ENCOUNTER — Inpatient Hospital Stay: Payer: Medicare Other

## 2022-09-11 DIAGNOSIS — Z510 Encounter for antineoplastic radiation therapy: Secondary | ICD-10-CM | POA: Diagnosis not present

## 2022-09-11 LAB — RAD ONC ARIA SESSION SUMMARY
Course Elapsed Days: 46
Plan Fractions Treated to Date: 2
Plan Prescribed Dose Per Fraction: 1.8 Gy
Plan Total Fractions Prescribed: 3
Plan Total Prescribed Dose: 5.4 Gy
Reference Point Dosage Given to Date: 3.6 Gy
Reference Point Session Dosage Given: 1.8 Gy
Session Number: 27

## 2022-09-12 ENCOUNTER — Other Ambulatory Visit: Payer: Self-pay

## 2022-09-12 ENCOUNTER — Other Ambulatory Visit: Payer: Self-pay | Admitting: Oncology

## 2022-09-12 ENCOUNTER — Ambulatory Visit
Admission: RE | Admit: 2022-09-12 | Discharge: 2022-09-12 | Disposition: A | Discharge status: Home / Self Care | Payer: Medicare Other | Source: Ambulatory Visit | Attending: Radiation Oncology | Admitting: Radiation Oncology

## 2022-09-12 ENCOUNTER — Inpatient Hospital Stay: Payer: Medicare Other

## 2022-09-12 ENCOUNTER — Encounter: Payer: Self-pay | Admitting: *Deleted

## 2022-09-12 DIAGNOSIS — Z510 Encounter for antineoplastic radiation therapy: Secondary | ICD-10-CM | POA: Diagnosis not present

## 2022-09-12 LAB — RAD ONC ARIA SESSION SUMMARY
Course Elapsed Days: 47
Plan Fractions Treated to Date: 3
Plan Prescribed Dose Per Fraction: 1.8 Gy
Plan Total Fractions Prescribed: 3
Plan Total Prescribed Dose: 5.4 Gy
Reference Point Dosage Given to Date: 5.4 Gy
Reference Point Session Dosage Given: 1.8 Gy
Session Number: 28

## 2022-09-15 ENCOUNTER — Ambulatory Visit: Payer: Self-pay | Admitting: *Deleted

## 2022-09-15 NOTE — Telephone Encounter (Signed)
I'm an acting nurse here.   She examined my head.   It's really scally and dry skin that itches a lot.  It's in my scalp and neck and back. Reason for Disposition  [1] Severe localized itching AND [2] after 2 days of steroid cream    The cream she is putting on there is causing burning.  Answer Assessment - Initial Assessment Questions 1. MECHANISM: "How did the injury happen?" For falls, ask: "What height did you fall from?" and "What surface did you fall against?"      Looks like open sores on scalp.   It flaky and open.   Very itchy for her.   She can't sleep.   2. ONSET: "When did the injury happen?" (Minutes or hours ago)      *No Answer* 3. NEUROLOGIC SYMPTOMS: "Was there any loss of consciousness?" "Are there any other neurological symptoms?"      *No Answer* 4. MENTAL STATUS: "Does the person know who they are, who you are, and where they are?"      *No Answer* 5. LOCATION: "What part of the head was hit?"      *No Answer* 6. SCALP APPEARANCE: "What does the scalp look like? Is it bleeding now?" If Yes, ask: "Is it difficult to stop?"      *No Answer* 7. SIZE: For cuts, bruises, or swelling, ask: "How large is it?" (e.g., inches or centimeters)      *No Answer* 8. PAIN: "Is there any pain?" If Yes, ask: "How bad is it?"  (e.g., Scale 1-10; or mild, moderate, severe)     *No Answer* 9. TETANUS: For any breaks in the skin, ask: "When was the last tetanus booster?"     *No Answer* 10. OTHER SYMPTOMS: "Do you have any other symptoms?" (e.g., neck pain, vomiting)       *No Answer* 11. PREGNANCY: "Is there any chance you are pregnant?" "When was your last menstrual period?"       *No Answer*  Answer Assessment - Initial Assessment Questions 1. APPEARANCE of RASH: "Describe the rash."      Pt calling in and has a nurse there with her too.   She is c/o having a very itchy scalp with open sores.   She is getting radiation therapy to her lower pelvis but does not think this is  related. This itching has been going on for 20 days but is getting so bad she can't sleep. 2. LOCATION: "Where is the rash located?"      Scalp 3. NUMBER: "How many spots are there?"      Multiple open itching sores 4. SIZE: "How big are the spots?" (Inches, centimeters or compare to size of a coin)       5. ONSET: "When did the rash start?"      20 days ago and has gotten worse 6. ITCHING: "Does the rash itch?" If Yes, ask: "How bad is the itch?"  (Scale 0-10; or none, mild, moderate, severe)     Yes very badly   Can't sleep 7. PAIN: "Does the rash hurt?" If Yes, ask: "How bad is the pain?"  (Scale 0-10; or none, mild, moderate, severe)    - NONE (0): no pain    - MILD (1-3): doesn't interfere with normal activities     - MODERATE (4-7): interferes with normal activities or awakens from sleep     - SEVERE (8-10): excruciating pain, unable to do any normal activities  Irritated and itching real bad 8. OTHER SYMPTOMS: "Do you have any other symptoms?" (e.g., fever)     No 9. PREGNANCY: "Is there any chance you are pregnant?" "When was your last menstrual period?"     N/A  Protocols used: Head Injury-A-AH, Rash or Redness - Localized-A-AH

## 2022-09-15 NOTE — Telephone Encounter (Signed)
  Chief Complaint: Received call from "acting nurse from [facility]" with pt in background.   Her scalp is really dry and scaly and itches real bad.  She can't sleep due to the itching.   It's on her neck and back too. Symptoms: Looks like open sores that are flaky and open and very itchy. Frequency: For last 20 days but is getting worse. Pertinent Negatives: Patient denies knowing what is causing it.  Getting radiation treatment to lower pelvis area. Disposition: '[]'$ ED /'[]'$ Urgent Care (no appt availability in office) / '[x]'$ Appointment(In office/virtual)/ '[]'$  [facility]/ '[]'$ Home Care/ '[]'$ Refused Recommended Disposition /'[]'$ [facility]/ '[]'$  Follow-up with PCP Additional Notes: Appt made with Dr. Ancil Boozer for 09/20/2022 at 8:40.  Put on wait list in case of a cancellation.

## 2022-09-18 ENCOUNTER — Telehealth: Payer: Self-pay | Admitting: Oncology

## 2022-09-18 NOTE — Telephone Encounter (Signed)
Tried calling pt am and pm. Unable to lvm. Appt with [facility] 09/25 r/s per provider. Lucianne Lei encounter cancelled

## 2022-09-19 ENCOUNTER — Inpatient Hospital Stay: Payer: Medicare Other

## 2022-09-19 ENCOUNTER — Ambulatory Visit: Payer: Medicare Other

## 2022-09-19 ENCOUNTER — Other Ambulatory Visit: Payer: Medicare Other

## 2022-09-19 ENCOUNTER — Ambulatory Visit: Payer: Medicare Other | Admitting: Oncology

## 2022-09-19 NOTE — Progress Notes (Unsigned)
Name: April Luna   MRN: [id number]    DOB: 06/20/1971   Date:09/20/2022       Progress Note  Subjective  Chief Complaint  Itchy Scalp  HPI  Patient came in today for evaluation of scalp rash that has been going on for months but getting worse, making her scratchy and affecting her sleep.   During triage she was found to be hypoxic and said she started to have worsening of cough over the past few days, no rhinorrhea , fever or chills. She always feels tired. Pulse ox between 80-92 % , mostly mid 80 % while sitting and talking but able to speak in full sentences  Patient Active Problem List   Diagnosis Date Noted   Atherosclerosis of aorta ([place]) 07/17/2022   Stage 3a chronic kidney disease ([place]) 07/17/2022   Thrombocytopenia ([place]) 06/24/2022   Tobacco use 05/26/2022   Encounter for antineoplastic chemotherapy 05/17/2022   Goals of care, counseling/discussion 05/03/2022   Rectal cancer ([place]) 04/21/2022   Genetic testing 03/27/2022   Family history of colon cancer 03/14/2022   Family history of uterine cancer 03/14/2022   Family history of breast cancer 03/14/2022   Polyp of ascending colon    Obstructive sleep apnea syndrome 08/11/2021   Plantar callus 10/06/2019   Acquired hallux limitus of both feet 10/06/2019   Dyslipidemia associated with type 2 diabetes mellitus ([place]) 06/24/2019   Chronic obstructive pulmonary disease ([place]) 06/24/2019   Chronic constipation 06/24/2019   ASCUS of cervix with negative high risk HPV 09/05/2018   GERD without esophagitis 04/11/2018   Hyperlipidemia 04/11/2018   Essential hypertension 04/11/2018   Hypothyroidism 04/26/2015   Bipolar I disorder, most recent episode (or current) manic ([place]) 04/25/2015   Delirium due to another medical condition 04/25/2015   Cannabis abuse 04/25/2015    Past Surgical History:  Procedure Laterality Date   BREAST BIOPSY Left 12/14/2021   [place] bx, venus marker, path pending   CESAREAN SECTION      COLONOSCOPY WITH PROPOFOL N/A 02/09/2022   Procedure: COLONOSCOPY WITH PROPOFOL;  Surgeon: Lin Landsman, MD;  Location: [facility];  Service: Endoscopy;  Laterality: N/A;  sleep apnea   CYSTOSCOPY W/ RETROGRADES Left 11/08/2018   Procedure: CYSTOSCOPY WITH RETROGRADE PYELOGRAM;  Surgeon: Billey Co, MD;  Location: [facility];  Service: Urology;  Laterality: Left;   CYSTOSCOPY/URETEROSCOPY/HOLMIUM LASER/STENT PLACEMENT Left 11/08/2018   Procedure: CYSTOSCOPY/URETEROSCOPY/HOLMIUM LASER/STENT PLACEMENT;  Surgeon: Billey Co, MD;  Location: [facility];  Service: Urology;  Laterality: Left;   IR IMAGING GUIDED PORT INSERTION  05/11/2022   MOUTH SURGERY     wisdom teeth extraction   MOUTH SURGERY     teeth removal   POLYPECTOMY N/A 02/09/2022   Procedure: POLYPECTOMY;  Surgeon: Lin Landsman, MD;  Location: [facility];  Service: Endoscopy;  Laterality: N/A;   XI ROBOTIC ASSISTED LOWER ANTERIOR RESECTION N/A 04/21/2022   Procedure: XI ROBOTIC ASSISTED LOWER ANTERIOR RESECTION WITH COLOSTOMY, BILATERAL TAP BLOCK, ASSESSMENT OF TISSUE PERFUSSION WITH FIREFLY INJECTION;  Surgeon: Ileana Roup, MD;  Location: [facility];  Service: General;  Laterality: N/A;    Family History  Problem Relation Age of Onset   Depression Mother    Anxiety disorder Mother    Diabetes Mother    Hypertension Mother    Hyperlipidemia Mother    Cancer Mother    Uterine cancer Mother 53   Cervical cancer Mother 66   Colon cancer Father    Depression Brother  Anxiety disorder Brother    Cancer Maternal Aunt        unk types   Diabetes Mellitus II Maternal Grandmother    Hypercholesterolemia Maternal Grandmother    Breast cancer Maternal Grandmother    Cancer Paternal Grandmother    Diabetes Paternal Grandmother    Melanoma Paternal Grandmother    Stomach cancer Paternal Grandmother     Social History   Tobacco Use   Smoking status: Every Day    Packs/day: 0.25     Years: 34.00    Total pack years: 8.50    Types: Cigarettes    Start date: 05/13/1985   Smokeless tobacco: Never   Tobacco comments:    1 PPD 04/20/223  Substance Use Topics   Alcohol use: Not Currently    Alcohol/week: 4.0 standard drinks of alcohol    Types: 4 Glasses of wine per week    Comment: quit ETOH in Feb. 2023     Current Outpatient Medications:    acetaminophen (TYLENOL) 325 MG tablet, Take 650 mg by mouth every 6 (six) hours as needed for headache (pain)., Disp: , Rfl:    albuterol (VENTOLIN HFA) 108 (90 Base) MCG/ACT inhaler, Inhale 2 puffs into the lungs every 6 (six) hours as needed for wheezing or shortness of breath., Disp: 1 each, Rfl: 5   amantadine (SYMMETREL) 100 MG capsule, Take 100 mg by mouth 2 (two) times daily., Disp: , Rfl:    blood glucose meter kit and supplies, Dispense based on patient and insurance preference. Use up to four times daily as directed. (FOR ICD-10 E10.9, E11.9)., Disp: 1 each, Rfl: 0   Budeson-Glycopyrrol-Formoterol (BREZTRI AEROSPHERE) 160-9-4.8 MCG/ACT AERO, Inhale 2 puffs into the lungs in the morning and at bedtime., Disp: 5.9 g, Rfl: 11   buPROPion (WELLBUTRIN XL) 150 MG 24 hr tablet, Take 150 mg by mouth every morning., Disp: , Rfl:    buPROPion (WELLBUTRIN XL) 300 MG 24 hr tablet, Take 300 mg by mouth daily., Disp: , Rfl:    capecitabine (XELODA) 150 MG tablet, Take 300 mg by mouth 2 (two) times daily after a meal. Take along with 576m tablets. Take Monday- Friday. Take only on days of radiation., Disp: , Rfl:    capecitabine (XELODA) 500 MG tablet, Take 2 tablets (1,000 mg total) by mouth 2 (two) times daily after a meal. Take along with 1530mtablets. Take Monday- Friday. Take only on days of radiation., Disp: 112 tablet, Rfl: 0   Cholecalciferol (VITAMIN D-3) 125 MCG (5000 UT) TABS, Take 5,000 Units by mouth daily., Disp: 30 tablet, Rfl: 1   docusate sodium (COLACE) 100 MG capsule, Take 1 capsule (100 mg total) by mouth 2 (two) times  daily., Disp: 60 capsule, Rfl: 1   FARXIGA 10 MG TABS tablet, TAKE 1 TABLET BY MOUTH DAILY BEFORE BREAKFAST., Disp: 30 tablet, Rfl: 1   gabapentin (NEURONTIN) 300 MG capsule, Take 300 mg by mouth 2 (two) times daily., Disp: , Rfl:    hydrocortisone cream 0.5 %, Apply 1 Application topically 2 (two) times daily as needed for itching., Disp: 30 g, Rfl: 1   Hydrocortisone, Perianal, 1 % CREA, Apply topically., Disp: , Rfl:    hydrOXYzine (VISTARIL) 50 MG capsule, Take 50 mg by mouth 3 (three) times daily as needed for anxiety., Disp: , Rfl:    icosapent Ethyl (VASCEPA) 1 g capsule, TAKE 2 CAPSULES BY MOUTH 2 TIMES DAILY, Disp: 120 capsule, Rfl: 3   [START ON 09/21/2022] KETOCONAZOLE, TOPICAL, 1 %  SHAM, Apply 1 each topically 2 (two) times a week., Disp: 300 mL, Rfl: 0   levothyroxine (SYNTHROID) 75 MCG tablet, TAKE 1 TABLET BY MOUTH DAILY BEFORE BREAKFAST, Disp: 90 tablet, Rfl: 0   lidocaine-prilocaine (EMLA) cream, Apply to affected area once, Disp: 30 g, Rfl: 3   loratadine (CLARITIN) 10 MG tablet, Take 10 mg by mouth every morning., Disp: , Rfl:    losartan (COZAAR) 25 MG tablet, Take 1 tablet (25 mg total) by mouth at bedtime., Disp: 30 tablet, Rfl: 3   NON FORMULARY, Pt uses a cpap nightly, Disp: , Rfl:    nystatin (MYCOSTATIN) 100000 UNIT/ML suspension, Take 5 mLs (500,000 Units total) by mouth 4 (four) times daily. Swish and spit, Disp: 60 mL, Rfl: 0   ondansetron (ZOFRAN) 8 MG tablet, Take 1 tablet (8 mg total) by mouth 2 (two) times daily as needed for refractory nausea / vomiting. Start on day 3 after chemotherapy., Disp: 30 tablet, Rfl: 1   paliperidone (INVEGA SUSTENNA) 234 MG/1.5ML SUSY injection, Inject 234 mg into the muscle every 30 (thirty) days. On or about the 14th of each month, Disp: , Rfl:    pantoprazole (PROTONIX) 40 MG tablet, TAKE 1 TABLET BY MOUTH DAILY, Disp: 90 tablet, Rfl: 0   Pediatric Multivit-Minerals-C (CHEWABLES MULTIVITAMIN PO), Take 1 tablet by mouth daily., Disp: ,  Rfl:    pioglitazone (ACTOS) 15 MG tablet, Take 1 tablet (15 mg total) by mouth daily., Disp: 30 tablet, Rfl: 3   prochlorperazine (COMPAZINE) 10 MG tablet, Take 1 tablet (10 mg total) by mouth every 6 (six) hours as needed for nausea or vomiting., Disp: 60 tablet, Rfl: 3   rosuvastatin (CRESTOR) 40 MG tablet, Take 1 tablet (40 mg total) by mouth every morning., Disp: 30 tablet, Rfl: 3   sertraline (ZOLOFT) 100 MG tablet, Take 200 mg by mouth every morning., Disp: , Rfl:    Spacer/Aero-Holding Chambers (AEROCHAMBER MV) inhaler, Use as instructed, Disp: 1 each, Rfl: 0  Allergies  Allergen Reactions   Metformin And Related Nausea And Vomiting   Nsaids Other (See Comments)    Stage 3 CKD   Perphenazine Other (See Comments)    Tremors, muscle weakness, tongue swelling   Sulfa Antibiotics Other (See Comments)    GI distress    Abilify [Aripiprazole] Rash   Penicillins Rash    She has taken amoxicillin without problems    I personally reviewed active problem list, medication list, allergies, family history, social history, health maintenance with the patient/caregiver today.   ROS  Ten systems reviewed and is negative except as mentioned in HPI  She has a rash on her sacrum from chemo  She also has daily fatigue  Objective  Vitals:   09/20/22 0856  BP: 90/80  Pulse: (!) 112  Resp: 14  Temp: 97.7 F (36.5 C)  TempSrc: Oral  SpO2: (!) 88%  Weight: 129 lb 4.8 oz (58.7 kg)  Height: '4\' 11"'  (1.499 m)    Body mass index is 26.12 kg/m.  Physical Exam  Constitutional: Patient appears well-developed and malnourished  No distress.  HEENT: head atraumatic, normocephalic, pupils equal and reactive to light,, neck supple Cardiovascular: sinus tachycardia  No murmur heard. No BLE edema. Pulmonary/Chest:bilateral rhonchi . No respiratory distress. Skin: scalp large flakes , some excoriation on neck  Psychiatric: Patient has a normal mood and affect. behavior is normal. Judgment and  thought content normal.    PHQ2/9:    09/20/2022    8:59 AM 08/25/2022  10:15 AM 07/17/2022    1:02 PM 03/15/2022    2:07 PM 12/02/2021    1:58 PM  Depression screen PHQ 2/9  Decreased Interest 1 0 '2 1 1  ' Down, Depressed, Hopeless 1 0 1 2 0  PHQ - 2 Score 2 0 '3 3 1  ' Altered sleeping '3 1 2 ' 0   Tired, decreased energy '3 1 3 3   ' Change in appetite '3 2 3 3   ' Feeling bad or failure about yourself  0 0 1 3   Trouble concentrating '1 1 1 ' 0   Moving slowly or fidgety/restless 0 0 2 0   Suicidal thoughts 0 0 0 0   PHQ-9 Score '12 5 15 12   ' Difficult doing work/chores Somewhat difficult  Somewhat difficult      phq 9 is positive   Fall Risk:    09/20/2022    8:44 AM 08/25/2022    9:58 AM 07/17/2022    1:29 PM 03/15/2022    2:07 PM 10/24/2021   11:30 AM  Fall Risk   Falls in the past year? 0 0 0 0 0  Number falls in past yr:  0  0   Injury with Fall?  0  0   Risk for fall due to : No Fall Risks No Fall Risks No Fall Risks No Fall Risks   Follow up Falls prevention discussed Falls prevention discussed Falls prevention discussed Falls prevention discussed Falls prevention discussed      Functional Status Survey: Is the patient deaf or have difficulty hearing?: Yes Does the patient have difficulty seeing, even when wearing glasses/contacts?: No Does the patient have difficulty concentrating, remembering, or making decisions?: No Does the patient have difficulty walking or climbing stairs?: No Does the patient have difficulty dressing or bathing?: No Does the patient have difficulty doing errands alone such as visiting a doctor's office or shopping?: No    Assessment & Plan  1. Hypoxemia   2. Tachycardia   3. Rectal cancer ([place])  Finished therapy two weeks ago   4. Seborrhea capitis in adult  - KETOCONAZOLE, TOPICAL, 1 % SHAM; Apply 1 each topically 2 (two) times a week.  Dispense: 300 mL; Refill: 0  5. Mucopurulent chronic bronchitis (HCC)   Possible a flare causing  hypoxemia but high risk for Pneumonia, PE. Discussed with patient and caregiver they will go to [facility] now by private car - did not want to use EMS  Report given to triage nurse Levada Dy

## 2022-09-20 ENCOUNTER — Emergency Department: Payer: Medicare Other

## 2022-09-20 ENCOUNTER — Ambulatory Visit (INDEPENDENT_AMBULATORY_CARE_PROVIDER_SITE_OTHER): Payer: Medicare Other | Admitting: Family Medicine

## 2022-09-20 ENCOUNTER — Other Ambulatory Visit: Payer: Self-pay

## 2022-09-20 ENCOUNTER — Encounter: Payer: Self-pay | Admitting: Family Medicine

## 2022-09-20 ENCOUNTER — Inpatient Hospital Stay
Admission: EM | Admit: 2022-09-20 | Discharge: 2022-09-24 | DRG: 190 | Disposition: A | Discharge status: Home / Self Care | Payer: Medicare Other | Attending: Internal Medicine | Admitting: Internal Medicine

## 2022-09-20 VITALS — BP 90/80 | HR 112 | Temp 97.7°F | Resp 14 | Ht 59.0 in | Wt 129.3 lb

## 2022-09-20 DIAGNOSIS — Z20822 Contact with and (suspected) exposure to covid-19: Secondary | ICD-10-CM | POA: Diagnosis present

## 2022-09-20 DIAGNOSIS — C20 Malignant neoplasm of rectum: Secondary | ICD-10-CM | POA: Diagnosis present

## 2022-09-20 DIAGNOSIS — L210 Seborrhea capitis: Secondary | ICD-10-CM | POA: Diagnosis not present

## 2022-09-20 DIAGNOSIS — E1122 Type 2 diabetes mellitus with diabetic chronic kidney disease: Secondary | ICD-10-CM | POA: Diagnosis present

## 2022-09-20 DIAGNOSIS — F17210 Nicotine dependence, cigarettes, uncomplicated: Secondary | ICD-10-CM | POA: Diagnosis present

## 2022-09-20 DIAGNOSIS — Z833 Family history of diabetes mellitus: Secondary | ICD-10-CM

## 2022-09-20 DIAGNOSIS — E1129 Type 2 diabetes mellitus with other diabetic kidney complication: Secondary | ICD-10-CM | POA: Diagnosis present

## 2022-09-20 DIAGNOSIS — J441 Chronic obstructive pulmonary disease with (acute) exacerbation: Secondary | ICD-10-CM | POA: Diagnosis present

## 2022-09-20 DIAGNOSIS — Z8249 Family history of ischemic heart disease and other diseases of the circulatory system: Secondary | ICD-10-CM

## 2022-09-20 DIAGNOSIS — Z86010 Personal history of colonic polyps: Secondary | ICD-10-CM

## 2022-09-20 DIAGNOSIS — I10 Essential (primary) hypertension: Secondary | ICD-10-CM | POA: Diagnosis present

## 2022-09-20 DIAGNOSIS — E039 Hypothyroidism, unspecified: Secondary | ICD-10-CM | POA: Diagnosis present

## 2022-09-20 DIAGNOSIS — R000 Tachycardia, unspecified: Secondary | ICD-10-CM | POA: Diagnosis not present

## 2022-09-20 DIAGNOSIS — J440 Chronic obstructive pulmonary disease with acute lower respiratory infection: Secondary | ICD-10-CM | POA: Diagnosis present

## 2022-09-20 DIAGNOSIS — K219 Gastro-esophageal reflux disease without esophagitis: Secondary | ICD-10-CM | POA: Diagnosis present

## 2022-09-20 DIAGNOSIS — Z83438 Family history of other disorder of lipoprotein metabolism and other lipidemia: Secondary | ICD-10-CM

## 2022-09-20 DIAGNOSIS — Z7951 Long term (current) use of inhaled steroids: Secondary | ICD-10-CM

## 2022-09-20 DIAGNOSIS — R0902 Hypoxemia: Secondary | ICD-10-CM

## 2022-09-20 DIAGNOSIS — T380X5A Adverse effect of glucocorticoids and synthetic analogues, initial encounter: Secondary | ICD-10-CM | POA: Diagnosis present

## 2022-09-20 DIAGNOSIS — N1831 Chronic kidney disease, stage 3a: Secondary | ICD-10-CM | POA: Diagnosis present

## 2022-09-20 DIAGNOSIS — G4733 Obstructive sleep apnea (adult) (pediatric): Secondary | ICD-10-CM | POA: Diagnosis present

## 2022-09-20 DIAGNOSIS — Z818 Family history of other mental and behavioral disorders: Secondary | ICD-10-CM | POA: Diagnosis not present

## 2022-09-20 DIAGNOSIS — Z79899 Other long term (current) drug therapy: Secondary | ICD-10-CM

## 2022-09-20 DIAGNOSIS — Z800 Family history of malignant neoplasm of digestive organs: Secondary | ICD-10-CM

## 2022-09-20 DIAGNOSIS — R809 Proteinuria, unspecified: Secondary | ICD-10-CM | POA: Diagnosis present

## 2022-09-20 DIAGNOSIS — J411 Mucopurulent chronic bronchitis: Secondary | ICD-10-CM

## 2022-09-20 DIAGNOSIS — E785 Hyperlipidemia, unspecified: Secondary | ICD-10-CM | POA: Diagnosis present

## 2022-09-20 DIAGNOSIS — Z9221 Personal history of antineoplastic chemotherapy: Secondary | ICD-10-CM | POA: Diagnosis not present

## 2022-09-20 DIAGNOSIS — Z794 Long term (current) use of insulin: Secondary | ICD-10-CM

## 2022-09-20 DIAGNOSIS — D696 Thrombocytopenia, unspecified: Secondary | ICD-10-CM | POA: Diagnosis present

## 2022-09-20 DIAGNOSIS — Z880 Allergy status to penicillin: Secondary | ICD-10-CM

## 2022-09-20 DIAGNOSIS — L219 Seborrheic dermatitis, unspecified: Secondary | ICD-10-CM | POA: Diagnosis present

## 2022-09-20 DIAGNOSIS — J9601 Acute respiratory failure with hypoxia: Secondary | ICD-10-CM | POA: Diagnosis present

## 2022-09-20 DIAGNOSIS — Z886 Allergy status to analgesic agent status: Secondary | ICD-10-CM

## 2022-09-20 DIAGNOSIS — Z720 Tobacco use: Secondary | ICD-10-CM | POA: Diagnosis present

## 2022-09-20 DIAGNOSIS — Z882 Allergy status to sulfonamides status: Secondary | ICD-10-CM

## 2022-09-20 DIAGNOSIS — E1165 Type 2 diabetes mellitus with hyperglycemia: Secondary | ICD-10-CM | POA: Diagnosis present

## 2022-09-20 DIAGNOSIS — F319 Bipolar disorder, unspecified: Secondary | ICD-10-CM | POA: Diagnosis present

## 2022-09-20 DIAGNOSIS — J181 Lobar pneumonia, unspecified organism: Secondary | ICD-10-CM

## 2022-09-20 DIAGNOSIS — I129 Hypertensive chronic kidney disease with stage 1 through stage 4 chronic kidney disease, or unspecified chronic kidney disease: Secondary | ICD-10-CM | POA: Diagnosis present

## 2022-09-20 DIAGNOSIS — Z7984 Long term (current) use of oral hypoglycemic drugs: Secondary | ICD-10-CM

## 2022-09-20 DIAGNOSIS — Z888 Allergy status to other drugs, medicaments and biological substances status: Secondary | ICD-10-CM

## 2022-09-20 LAB — BASIC METABOLIC PANEL
Anion gap: 9 (ref 5–15)
BUN: 19 mg/dL (ref 6–20)
CO2: 21 mmol/L — ABNORMAL LOW (ref 22–32)
Calcium: 9.4 mg/dL (ref 8.9–10.3)
Chloride: 108 mmol/L (ref 98–111)
Creatinine, Ser: 0.83 mg/dL (ref 0.44–1.00)
GFR, Estimated: >60 mL/min (ref >60)
Glucose, Bld: 177 mg/dL — ABNORMAL HIGH (ref 70–99)
Potassium: 3.9 mmol/L (ref 3.5–5.1)
Sodium: 138 mmol/L (ref 135–145)

## 2022-09-20 LAB — CBC
HCT: 42.5 % (ref 36.0–46.0)
Hemoglobin: 14.4 g/dL (ref 12.0–15.0)
MCH: 34.4 pg — ABNORMAL HIGH (ref 26.0–34.0)
MCHC: 33.9 g/dL (ref 30.0–36.0)
MCV: 101.7 fL — ABNORMAL HIGH (ref 80.0–100.0)
Platelets: 137 10*3/uL — ABNORMAL LOW (ref 150–400)
RBC: 4.18 MIL/uL (ref 3.87–5.11)
RDW: 17.6 % — ABNORMAL HIGH (ref 11.5–15.5)
WBC: 8 10*3/uL (ref 4.0–10.5)
nRBC: 0 % (ref 0.0–0.2)

## 2022-09-20 LAB — TROPONIN I (HIGH SENSITIVITY): Troponin I (High Sensitivity): 3 ng/L (ref <18)

## 2022-09-20 LAB — CBG MONITORING, ED: Glucose-Capillary: 218 mg/dL — ABNORMAL HIGH (ref 70–99)

## 2022-09-20 MED ORDER — BUPROPION HCL ER (XL) 150 MG PO TB24
300.0000 mg | ORAL_TABLET | Freq: Every day | ORAL | Status: DC
  Administered 2022-09-21 – 2022-09-24 (×4): 300 mg via ORAL
  Filled 2022-09-20 (×4): qty 2

## 2022-09-20 MED ORDER — ONDANSETRON HCL 4 MG/2ML IJ SOLN: 4.0000 mg | Freq: Four times a day (QID) | INTRAMUSCULAR | Status: DC | PRN

## 2022-09-20 MED ORDER — ROSUVASTATIN CALCIUM 20 MG PO TABS
40.0000 mg | ORAL_TABLET | Freq: Every morning | ORAL | Status: DC
  Administered 2022-09-21 – 2022-09-24 (×4): 40 mg via ORAL
  Filled 2022-09-20 (×6): qty 2

## 2022-09-20 MED ORDER — LOSARTAN POTASSIUM 25 MG PO TABS
25.0000 mg | ORAL_TABLET | Freq: Every day | ORAL | Status: DC
  Administered 2022-09-20 – 2022-09-22 (×3): 25 mg via ORAL
  Filled 2022-09-20 (×4): qty 1

## 2022-09-20 MED ORDER — BUDESON-GLYCOPYRROL-FORMOTEROL 160-9-4.8 MCG/ACT IN AERO: 2.0000 | INHALATION_SPRAY | Freq: Two times a day (BID) | RESPIRATORY_TRACT | Status: DC

## 2022-09-20 MED ORDER — NICOTINE 21 MG/24HR TD PT24: 21.0000 mg | MEDICATED_PATCH | Freq: Every day | TRANSDERMAL | Status: DC

## 2022-09-20 MED ORDER — LEVOTHYROXINE SODIUM 50 MCG PO TABS
75.0000 ug | ORAL_TABLET | Freq: Every day | ORAL | Status: DC
  Administered 2022-09-21 – 2022-09-24 (×4): 75 ug via ORAL
  Filled 2022-09-20 (×4): qty 1

## 2022-09-20 MED ORDER — MOMETASONE FURO-FORMOTEROL FUM 200-5 MCG/ACT IN AERO
2.0000 | INHALATION_SPRAY | Freq: Two times a day (BID) | RESPIRATORY_TRACT | Status: DC
  Administered 2022-09-21 – 2022-09-24 (×7): 2 via RESPIRATORY_TRACT
  Filled 2022-09-20: qty 8.8

## 2022-09-20 MED ORDER — HYDROXYZINE HCL 50 MG PO TABS
50.0000 mg | ORAL_TABLET | Freq: Three times a day (TID) | ORAL | Status: DC | PRN
  Administered 2022-09-22 – 2022-09-24 (×4): 50 mg via ORAL
  Filled 2022-09-20 (×4): qty 1

## 2022-09-20 MED ORDER — METHYLPREDNISOLONE SODIUM SUCC 40 MG IJ SOLR
40.0000 mg | Freq: Two times a day (BID) | INTRAMUSCULAR | Status: DC
  Administered 2022-09-20: 40 mg via INTRAVENOUS
  Filled 2022-09-20: qty 1

## 2022-09-20 MED ORDER — IOHEXOL 350 MG/ML SOLN
75.0000 mL | Freq: Once | INTRAVENOUS | Status: AC | PRN
  Administered 2022-09-20: 75 mL via INTRAVENOUS

## 2022-09-20 MED ORDER — PREDNISONE 20 MG PO TABS: 40.0000 mg | ORAL_TABLET | Freq: Every day | ORAL | Status: DC

## 2022-09-20 MED ORDER — CAPECITABINE 500 MG PO TABS: 1000.0000 mg | ORAL_TABLET | Freq: Two times a day (BID) | ORAL | Status: DC

## 2022-09-20 MED ORDER — ACETAMINOPHEN 325 MG PO TABS: 650.0000 mg | ORAL_TABLET | Freq: Four times a day (QID) | ORAL | Status: DC | PRN

## 2022-09-20 MED ORDER — UMECLIDINIUM BROMIDE 62.5 MCG/ACT IN AEPB
1.0000 | INHALATION_SPRAY | Freq: Every day | RESPIRATORY_TRACT | Status: DC
  Administered 2022-09-21 – 2022-09-24 (×4): 1 via RESPIRATORY_TRACT
  Filled 2022-09-20: qty 7

## 2022-09-20 MED ORDER — ONDANSETRON HCL 4 MG PO TABS: 4.0000 mg | ORAL_TABLET | Freq: Four times a day (QID) | ORAL | Status: DC | PRN

## 2022-09-20 MED ORDER — MOMETASONE FURO-FORMOTEROL FUM 200-5 MCG/ACT IN AERO
2.0000 | INHALATION_SPRAY | Freq: Two times a day (BID) | RESPIRATORY_TRACT | Status: DC
  Filled 2022-09-20: qty 8.8

## 2022-09-20 MED ORDER — ALBUTEROL SULFATE (2.5 MG/3ML) 0.083% IN NEBU
2.5000 mg | INHALATION_SOLUTION | Freq: Once | RESPIRATORY_TRACT | Status: AC
  Administered 2022-09-20: 2.5 mg via RESPIRATORY_TRACT
  Filled 2022-09-20: qty 3

## 2022-09-20 MED ORDER — IPRATROPIUM-ALBUTEROL 0.5-2.5 (3) MG/3ML IN SOLN
3.0000 mL | Freq: Once | RESPIRATORY_TRACT | Status: AC
  Administered 2022-09-20: 3 mL via RESPIRATORY_TRACT

## 2022-09-20 MED ORDER — AMANTADINE HCL 100 MG PO CAPS
100.0000 mg | ORAL_CAPSULE | Freq: Two times a day (BID) | ORAL | Status: DC
  Administered 2022-09-20 – 2022-09-24 (×8): 100 mg via ORAL
  Filled 2022-09-20 (×9): qty 1

## 2022-09-20 MED ORDER — CAPECITABINE 150 MG PO TABS: 300.0000 mg | ORAL_TABLET | Freq: Two times a day (BID) | ORAL | Status: DC

## 2022-09-20 MED ORDER — SODIUM CHLORIDE 0.9 % IV SOLN: INTRAVENOUS | Status: DC

## 2022-09-20 MED ORDER — ICOSAPENT ETHYL 1 G PO CAPS
2.0000 g | ORAL_CAPSULE | Freq: Two times a day (BID) | ORAL | Status: DC
  Administered 2022-09-20 – 2022-09-24 (×8): 2 g via ORAL
  Filled 2022-09-20 (×9): qty 2

## 2022-09-20 MED ORDER — IPRATROPIUM-ALBUTEROL 0.5-2.5 (3) MG/3ML IN SOLN
3.0000 mL | Freq: Four times a day (QID) | RESPIRATORY_TRACT | Status: DC
  Administered 2022-09-20 – 2022-09-24 (×14): 3 mL via RESPIRATORY_TRACT
  Filled 2022-09-20 (×16): qty 3

## 2022-09-20 MED ORDER — NICOTINE 14 MG/24HR TD PT24
14.0000 mg | MEDICATED_PATCH | Freq: Once | TRANSDERMAL | Status: DC
  Administered 2022-09-20: 14 mg via TRANSDERMAL
  Filled 2022-09-20: qty 1

## 2022-09-20 MED ORDER — KETOCONAZOLE 1 % EX SHAM: 1.0000 | MEDICATED_SHAMPOO | CUTANEOUS | 0 refills | Status: DC

## 2022-09-20 MED ORDER — SERTRALINE HCL 50 MG PO TABS
200.0000 mg | ORAL_TABLET | Freq: Every morning | ORAL | Status: DC
  Administered 2022-09-21 – 2022-09-24 (×3): 200 mg via ORAL
  Filled 2022-09-20 (×4): qty 4

## 2022-09-20 MED ORDER — INSULIN ASPART 100 UNIT/ML IJ SOLN
0.0000 [IU] | Freq: Three times a day (TID) | INTRAMUSCULAR | Status: DC
  Administered 2022-09-20 – 2022-09-21 (×2): 5 [IU] via SUBCUTANEOUS
  Administered 2022-09-21: 3 [IU] via SUBCUTANEOUS
  Administered 2022-09-21: 5 [IU] via SUBCUTANEOUS
  Administered 2022-09-22: 3 [IU] via SUBCUTANEOUS
  Administered 2022-09-22: 8 [IU] via SUBCUTANEOUS
  Administered 2022-09-22: 3 [IU] via SUBCUTANEOUS
  Administered 2022-09-23: 11 [IU] via SUBCUTANEOUS
  Administered 2022-09-23: 5 [IU] via SUBCUTANEOUS
  Administered 2022-09-23 – 2022-09-24 (×2): 2 [IU] via SUBCUTANEOUS
  Administered 2022-09-24: 8 [IU] via SUBCUTANEOUS
  Filled 2022-09-20 (×11): qty 1

## 2022-09-20 MED ORDER — PANTOPRAZOLE SODIUM 40 MG PO TBEC
40.0000 mg | DELAYED_RELEASE_TABLET | Freq: Every day | ORAL | Status: DC
  Administered 2022-09-21 – 2022-09-24 (×4): 40 mg via ORAL
  Filled 2022-09-20 (×4): qty 1

## 2022-09-20 MED ORDER — ALBUTEROL SULFATE (2.5 MG/3ML) 0.083% IN NEBU: 2.5000 mg | INHALATION_SOLUTION | RESPIRATORY_TRACT | Status: DC | PRN

## 2022-09-20 MED ORDER — NICOTINE POLACRILEX 2 MG MT GUM
2.0000 mg | CHEWING_GUM | OROMUCOSAL | Status: DC | PRN
  Administered 2022-09-20 – 2022-09-24 (×6): 2 mg via ORAL
  Filled 2022-09-20 (×7): qty 1

## 2022-09-20 MED ORDER — IPRATROPIUM-ALBUTEROL 0.5-2.5 (3) MG/3ML IN SOLN
3.0000 mL | Freq: Once | RESPIRATORY_TRACT | Status: AC
  Administered 2022-09-20: 3 mL via RESPIRATORY_TRACT
  Filled 2022-09-20: qty 9

## 2022-09-20 MED ORDER — GABAPENTIN 300 MG PO CAPS
300.0000 mg | ORAL_CAPSULE | Freq: Two times a day (BID) | ORAL | Status: DC
  Administered 2022-09-20 – 2022-09-24 (×8): 300 mg via ORAL
  Filled 2022-09-20 (×9): qty 1

## 2022-09-20 MED ORDER — VITAMIN D 25 MCG (1000 UNIT) PO TABS
5000.0000 [IU] | ORAL_TABLET | Freq: Every day | ORAL | Status: DC
  Administered 2022-09-21 – 2022-09-24 (×4): 5000 [IU] via ORAL
  Filled 2022-09-20 (×5): qty 5

## 2022-09-20 MED ORDER — DOCUSATE SODIUM 100 MG PO CAPS
100.0000 mg | ORAL_CAPSULE | Freq: Two times a day (BID) | ORAL | Status: DC
  Administered 2022-09-20 – 2022-09-24 (×6): 100 mg via ORAL
  Filled 2022-09-20 (×8): qty 1

## 2022-09-20 MED ORDER — IOHEXOL 300 MG/ML  SOLN: 75.0000 mL | Freq: Once | INTRAMUSCULAR | Status: DC | PRN

## 2022-09-20 MED ORDER — UMECLIDINIUM BROMIDE 62.5 MCG/ACT IN AEPB
1.0000 | INHALATION_SPRAY | Freq: Every day | RESPIRATORY_TRACT | Status: DC
  Filled 2022-09-20: qty 7

## 2022-09-20 MED ORDER — NICOTINE 21 MG/24HR TD PT24
21.0000 mg | MEDICATED_PATCH | Freq: Every day | TRANSDERMAL | Status: DC
  Administered 2022-09-20 – 2022-09-24 (×5): 21 mg via TRANSDERMAL
  Filled 2022-09-20 (×5): qty 1

## 2022-09-20 MED ORDER — PREDNISONE 20 MG PO TABS
60.0000 mg | ORAL_TABLET | Freq: Once | ORAL | Status: AC
  Administered 2022-09-20: 60 mg via ORAL
  Filled 2022-09-20: qty 3

## 2022-09-20 MED ORDER — ENOXAPARIN SODIUM 40 MG/0.4ML IJ SOSY
40.0000 mg | PREFILLED_SYRINGE | INTRAMUSCULAR | Status: DC
  Administered 2022-09-22 – 2022-09-23 (×2): 40 mg via SUBCUTANEOUS
  Filled 2022-09-20 (×4): qty 0.4

## 2022-09-20 NOTE — Assessment & Plan Note (Signed)
Stable Continue Synthroid 

## 2022-09-20 NOTE — Assessment & Plan Note (Addendum)
Continue Cardizem CD and Cozaar

## 2022-09-20 NOTE — Assessment & Plan Note (Addendum)
Continue CPAP at bedtime.  Patient states that she will have to look into getting a new CPAP

## 2022-09-20 NOTE — Assessment & Plan Note (Addendum)
Restart Farxiga and continue sliding scale insulin.  Last hemoglobin A1c couple months ago was 7.4.  Sugars higher secondary to steroids.

## 2022-09-20 NOTE — ED Notes (Signed)
Walked past pts room and noted her standing in the corner of the room, asked if pt was ok and then got a strong odor of cigarette smoke, pt was asked if she was smoking and she stated yes, pt was instructed that she is NOT allowed to smoke in the hospital and the risk of possible explosion with o2. Pt verbalized understanding and states that she will not do it again. Primary RN notified.

## 2022-09-20 NOTE — ED Notes (Signed)
Pt provided pillow as requested

## 2022-09-20 NOTE — ED Notes (Signed)
See triage note. Pt reports she was sent here by MD for low O2, shob and rash. Pt speaking in complete sentences. Nad noted. Pt to xray

## 2022-09-20 NOTE — Assessment & Plan Note (Addendum)
Continue nicotine patch and gum

## 2022-09-20 NOTE — ED Provider Notes (Signed)
[facility] Provider Note    Event Date/Time   First MD Initiated Contact with Patient 09/20/22 [phone]     (approximate)   History   Shortness of Breath   HPI  April Luna is a 51 y.o. female with past medical history of rectal cancer, stage III on adjuvant chemo, COPD not on home oxygen presents with shortness of breath.  Patient tells me she has been feeling short of breath for years but is been worse over the last 2 weeks.  She feels short of breath on exertion.  Denies associated chest pain.  Has had cough but it is nonproductive no hemoptysis.  Denies lower extremity swelling or pain.  She went to see her primary doctor for both the shortness of breath and seborrhea and had hypoxia so was sent to the emergency department     Past Medical History:  Diagnosis Date   Allergy    pollen   Anxiety    Arthritis    right hip   Bipolar 1 disorder ([place])    Cancer ([place])    rectal   Chronic kidney disease    COPD (chronic obstructive pulmonary disease) (HCC)    Depression    Family history of adverse reaction to anesthesia    grand father had a stroke during anesthesia   Family history of breast cancer    Family history of colon cancer    Family history of uterine cancer    GERD (gastroesophageal reflux disease)    History of kidney stones    Hyperlipidemia    Hypertension    Hypothyroidism    Panic attack    Pneumonia    Psoriasis    Sleep apnea 08/11/2021   No CPAP   Type 2 diabetes mellitus with microalbuminuria, without long-term current use of insulin ([place]) 06/24/2019    Patient Active Problem List   Diagnosis Date Noted   Atherosclerosis of aorta ([place]) 07/17/2022   Stage 3a chronic kidney disease ([place]) 07/17/2022   Thrombocytopenia ([place]) 06/24/2022   Tobacco use 05/26/2022   Encounter for antineoplastic chemotherapy 05/17/2022   Goals of care, counseling/discussion 05/03/2022   Rectal cancer ([place]) 04/21/2022   Genetic testing  03/27/2022   Family history of colon cancer 03/14/2022   Family history of uterine cancer 03/14/2022   Family history of breast cancer 03/14/2022   Polyp of ascending colon    Obstructive sleep apnea syndrome 08/11/2021   Plantar callus 10/06/2019   Acquired hallux limitus of both feet 10/06/2019   Dyslipidemia associated with type 2 diabetes mellitus ([place]) 06/24/2019   Chronic obstructive pulmonary disease ([place]) 06/24/2019   Chronic constipation 06/24/2019   ASCUS of cervix with negative high risk HPV 09/05/2018   GERD without esophagitis 04/11/2018   Hyperlipidemia 04/11/2018   Essential hypertension 04/11/2018   Hypothyroidism 04/26/2015   Bipolar I disorder, most recent episode (or current) manic ([place]) 04/25/2015   Delirium due to another medical condition 04/25/2015   Cannabis abuse 04/25/2015     Physical Exam  Triage Vital Signs: ED Triage Vitals  Enc Vitals Group     BP 09/20/22 0927 120/83     Pulse Rate 09/20/22 0927 92     Resp 09/20/22 0927 (!) 22     Temp 09/20/22 0927 97.6 F (36.4 C)     Temp Source 09/20/22 0927 Oral     SpO2 09/20/22 0927 91 %     Weight 09/20/22 0924 129 lb 6.6 oz (58.7 kg)  Height 09/20/22 0924 '4\' 11"'$  (1.499 m)     Head Circumference --      Peak Flow --      Pain Score 09/20/22 0924 0     Pain Loc --      Pain Edu? --      Excl. in [place]? --     Most recent vital signs: Vitals:   09/20/22 1200 09/20/22 1230  BP: (!) 111/97 101/70  Pulse: (!) 103 91  Resp: 16 20  Temp:    SpO2: 95% 100%     General: Awake, no distress.  CV:  Good peripheral perfusion.  No peripheral edema Resp:  Normal effort.  Patient has audible wheezing with no increased work of breathing, expiratory and inspiratory wheezing on exam with good air movement Abd:  No distention. Neuro:             Awake, Alert, Oriented x 3  Other:     ED Results / Procedures / Treatments  Labs (all labs ordered are listed, but only abnormal results are  displayed) Labs Reviewed  CBC - Abnormal; Notable for the following components:      Result Value   MCV 101.7 (*)    MCH 34.4 (*)    RDW 17.6 (*)    Platelets 137 (*)    All other components within normal limits  BASIC METABOLIC PANEL - Abnormal; Notable for the following components:   CO2 21 (*)    Glucose, Bld 177 (*)    All other components within normal limits  TROPONIN I (HIGH SENSITIVITY)     EKG  EKG reviewed and interpreted by myself shows sinus tachycardia with normal axis normal intervals no acute ischemic changes there is artifact primarily in lead I and lead II and aVR   RADIOLOGY I reviewed and interpreted the CXR which does not show any acute cardiopulmonary process    PROCEDURES:  Critical Care performed: No  Procedures  The patient is on the cardiac monitor to evaluate for evidence of arrhythmia and/or significant heart rate changes.   MEDICATIONS ORDERED IN ED: Medications  albuterol (PROVENTIL) (2.5 MG/3ML) 0.083% nebulizer solution 2.5 mg (has no administration in time range)  ipratropium-albuterol (DUONEB) 0.5-2.5 (3) MG/3ML nebulizer solution 3 mL (3 mLs Nebulization Given 09/20/22 1104)  ipratropium-albuterol (DUONEB) 0.5-2.5 (3) MG/3ML nebulizer solution 3 mL (3 mLs Nebulization Given 09/20/22 1104)  ipratropium-albuterol (DUONEB) 0.5-2.5 (3) MG/3ML nebulizer solution 3 mL (3 mLs Nebulization Given 09/20/22 1103)  predniSONE (DELTASONE) tablet 60 mg (60 mg Oral Given 09/20/22 1103)  iohexol (OMNIPAQUE) 350 MG/ML injection 75 mL (75 mLs Intravenous Contrast Given 09/20/22 1132)     IMPRESSION / MDM / ASSESSMENT AND PLAN / ED COURSE  I reviewed the triage vital signs and the nursing notes.                              Patient's presentation is most consistent with acute presentation with potential threat to life or bodily function.  Differential diagnosis includes, but is not limited to, COPD exacerbation, pneumonia, pleural effusion, pulmonary  embolism  The patient is a 51 year old female who presents with shortness of breath.  She tells me she is short of breath chronically but is been worse over the last 2 weeks.  When she went to see her primary doctor both about her breathing and rash on her scalp she was found to be hypoxic so referred to ED.  She is on 2 L nasal cannula satting 100% on my evaluation.  Does have audible wheezing but is not working.  Chest x-ray is clear EKG showing sinus tach without ischemic change.  Troponin is negative.  Suspect COPD exacerbation but she is also high risk for PE given the cancer.  Will obtain CTA give DuoNebs prednisone reassess.  Patient CTA does not show any pulmonary embolism.  There is small area of atelectasis versus infiltrate.  Patient feeling improved after nebs and steroids.  I took her off of her oxygen she did desaturate to 87%.  Placed back on 2 L.  She is still wheezing but improved.  We will give another albuterol neb.  She will require admission for COPD exacerbation.  We will hold off on antibiotics at this time.      FINAL CLINICAL IMPRESSION(S) / ED DIAGNOSES   Final diagnoses:  COPD exacerbation ([place])     Rx / DC Orders   ED Discharge Orders     None        Note:  This document was prepared using Dragon voice recognition software and may include unintentional dictation errors.   Rada Hay, MD 09/20/22 1311

## 2022-09-20 NOTE — ED Notes (Signed)
Pt given phone as requested

## 2022-09-20 NOTE — Assessment & Plan Note (Addendum)
Stage III.  Follow-up with Dr. Tasia Catchings as outpatient.

## 2022-09-20 NOTE — ED Triage Notes (Signed)
Pt here from [facility] with SOB that started last night. Pt sats were 85% on RA. Pt finished radiation on the 19th and has chemotherapy on 10/03/22. Pt has arthritis pain but denies CP.

## 2022-09-20 NOTE — Assessment & Plan Note (Addendum)
Patient was IV Solu-Medrol here in the hospital will be switched over to p.o. prednisone upon disposition.  Nebulizer was prescribed upon discharge.  Continue inhalers and as needed nebulizer.

## 2022-09-20 NOTE — H&P (Signed)
History and Physical    Patient: April Luna OHY:[id number] DOB: 1971/09/25 DOA: 09/20/2022 DOS: the patient was seen and examined on 09/20/2022 PCP: Steele Sizer, MD  Patient coming from: Home  Chief Complaint:  Chief Complaint  Patient presents with   Shortness of Breath   HPI: April Luna is a 51 y.o. female with medical history significant for COPD, nicotine dependence, bipolar disorder, hypothyroidism, hypertension, colorectal cancer on chemotherapy who was sent to the emergency room by her primary care provider for evaluation of worsening shortness of breath and hypoxia. Patient states that at baseline she is usually short of breath but this has worsened over the last couple of days.  Shortness of breath associated with a dry, nonproductive cough.  She denies having any fever or chills and denies having any sick contacts.  She denies having any nasal congestion or sore throat.  While at her primary care provider's office she was noted to have room air pulse oximetry between 80 and 92% and so was referred to the ER for further evaluation. She denies having any chest pain, no abdominal pain, no nausea, no vomiting, no urinary symptoms, no changes in her bowel habits, no headache, no dizziness, no lightheadedness, no blurred vision no focal deficit. Upon arrival to the ER she had room air pulse oximetry of 88% and was placed on 2 L of oxygen. She had a CT angiogram of the chest which showed no evidence of pulmonary artery embolism. There is no evidence of thoracic aortic dissection. Small linear patchy alveolar infiltrate is seen in medial segment of right middle lobe suggesting atelectasis/pneumonia. She received prednisone 60 mg and several nebulizer treatments without any improvement in her symptoms. Attempts were made to wean her off oxygen but her pulse oximetry kept dropping to the low 80s at rest. She will be admitted to the hospital for further evaluation   Review  of Systems: As mentioned in the history of present illness. All other systems reviewed and are negative. Past Medical History:  Diagnosis Date   Allergy    pollen   Anxiety    Arthritis    right hip   Bipolar 1 disorder (HCC)    Cancer (HCC)    rectal   Chronic kidney disease    COPD (chronic obstructive pulmonary disease) (HCC)    Depression    Family history of adverse reaction to anesthesia    grand father had a stroke during anesthesia   Family history of breast cancer    Family history of colon cancer    Family history of uterine cancer    GERD (gastroesophageal reflux disease)    History of kidney stones    Hyperlipidemia    Hypertension    Hypothyroidism    Panic attack    Pneumonia    Psoriasis    Sleep apnea 08/11/2021   No CPAP   Type 2 diabetes mellitus with microalbuminuria, without long-term current use of insulin ([place]) 06/24/2019   Past Surgical History:  Procedure Laterality Date   BREAST BIOPSY Left 12/14/2021   [place] bx, venus marker, path pending   CESAREAN SECTION     COLONOSCOPY WITH PROPOFOL N/A 02/09/2022   Procedure: COLONOSCOPY WITH PROPOFOL;  Surgeon: Lin Landsman, MD;  Location: [place];  Service: Endoscopy;  Laterality: N/A;  sleep apnea   CYSTOSCOPY W/ RETROGRADES Left 11/08/2018   Procedure: CYSTOSCOPY WITH RETROGRADE PYELOGRAM;  Surgeon: Billey Co, MD;  Location: [facility];  Service: Urology;  Laterality:  Left;   CYSTOSCOPY/URETEROSCOPY/HOLMIUM LASER/STENT PLACEMENT Left 11/08/2018   Procedure: CYSTOSCOPY/URETEROSCOPY/HOLMIUM LASER/STENT PLACEMENT;  Surgeon: Billey Co, MD;  Location: [facility];  Service: Urology;  Laterality: Left;   IR IMAGING GUIDED PORT INSERTION  05/11/2022   MOUTH SURGERY     wisdom teeth extraction   MOUTH SURGERY     teeth removal   POLYPECTOMY N/A 02/09/2022   Procedure: POLYPECTOMY;  Surgeon: Lin Landsman, MD;  Location: [place];  Service: Endoscopy;  Laterality: N/A;    XI ROBOTIC ASSISTED LOWER ANTERIOR RESECTION N/A 04/21/2022   Procedure: XI ROBOTIC ASSISTED LOWER ANTERIOR RESECTION WITH COLOSTOMY, BILATERAL TAP BLOCK, ASSESSMENT OF TISSUE PERFUSSION WITH FIREFLY INJECTION;  Surgeon: Ileana Roup, MD;  Location: [facility];  Service: General;  Laterality: N/A;   Social History:  reports that she has been smoking cigarettes. She started smoking about 37 years ago. She has a 8.50 pack-year smoking history. She has never used smokeless tobacco. She reports that she does not currently use alcohol after a past usage of about 4.0 standard drinks of alcohol per week. She reports that she does not currently use drugs after having used the following drugs: Marijuana.  Allergies  Allergen Reactions   Metformin And Related Nausea And Vomiting   Nsaids Other (See Comments)    Stage 3 CKD   Perphenazine Other (See Comments)    Tremors, muscle weakness, tongue swelling   Sulfa Antibiotics Other (See Comments)    GI distress    Abilify [Aripiprazole] Rash   Penicillins Rash    She has taken amoxicillin without problems    Family History  Problem Relation Age of Onset   Depression Mother    Anxiety disorder Mother    Diabetes Mother    Hypertension Mother    Hyperlipidemia Mother    Cancer Mother    Uterine cancer Mother 2   Cervical cancer Mother 38   Colon cancer Father    Depression Brother    Anxiety disorder Brother    Cancer Maternal Aunt        unk types   Diabetes Mellitus II Maternal Grandmother    Hypercholesterolemia Maternal Grandmother    Breast cancer Maternal Grandmother    Cancer Paternal Grandmother    Diabetes Paternal Grandmother    Melanoma Paternal Grandmother    Stomach cancer Paternal Grandmother     Prior to Admission medications   Medication Sig Start Date End Date Taking? Authorizing Provider  acetaminophen (TYLENOL) 325 MG tablet Take 650 mg by mouth every 6 (six) hours as needed for headache (pain).   Yes [provider]  albuterol (VENTOLIN HFA) 108 (90 Base) MCG/ACT inhaler Inhale 2 puffs into the lungs every 6 (six) hours as needed for wheezing or shortness of breath. 04/13/22  Yes Chesley Mires, MD  amantadine (SYMMETREL) 100 MG capsule Take 100 mg by mouth 2 (two) times daily. 03/19/18  Yes Charlcie Cradle, MD  Budeson-Glycopyrrol-Formoterol (BREZTRI AEROSPHERE) 160-9-4.8 MCG/ACT AERO Inhale 2 puffs into the lungs in the morning and at bedtime. 04/13/22  Yes Chesley Mires, MD  buPROPion (WELLBUTRIN XL) 300 MG 24 hr tablet Take 300 mg by mouth daily. 09/11/22  Yes [provider]  capecitabine (XELODA) 150 MG tablet Take 300 mg by mouth 2 (two) times daily after a meal. Take along with 598m tablets. Take Monday- Friday. Take only on days of radiation. 07/27/22  Yes YEarlie Server MD  Cholecalciferol (VITAMIN D-3) 125 MCG (5000 UT) TABS Take 5,000 Units  by mouth daily. 10/14/19  Yes Sowles, Drue Stager, MD  docusate sodium (COLACE) 100 MG capsule Take 1 capsule (100 mg total) by mouth 2 (two) times daily. 08/01/22  Yes [name], MD  FARXIGA 10 MG TABS tablet TAKE 1 TABLET BY MOUTH DAILY BEFORE BREAKFAST. 09/10/22  Yes Sowles, Drue Stager, MD  gabapentin (NEURONTIN) 300 MG capsule Take 300 mg by mouth 2 (two) times daily.   Yes [place].  hydrocortisone cream 0.5 % Apply 1 Application topically 2 (two) times daily as needed for itching. 06/23/22  Yes [name], MD  Hydrocortisone, Perianal, 1 % CREA Apply topically. 06/23/22  Yes [provider]  hydrOXYzine (VISTARIL) 50 MG capsule Take 50 mg by mouth 3 (three) times daily as needed for anxiety.   Yes [place] Ethyl (VASCEPA) 1 g capsule TAKE 2 CAPSULES BY MOUTH 2 TIMES DAILY 07/06/22  Yes Bo Merino, FNP  KETOCONAZOLE, TOPICAL, 1 % SHAM Apply 1 each topically 2 (two) times a week. 09/21/22  Yes Sowles, Drue Stager, MD  levothyroxine (SYNTHROID) 75 MCG tablet TAKE 1  TABLET BY MOUTH DAILY BEFORE BREAKFAST 07/06/22  Yes Bo Merino, FNP  lidocaine-prilocaine (EMLA) cream Apply to affected area once 05/04/22  Yes [name], MD  loratadine (CLARITIN) 10 MG tablet Take 10 mg by mouth every morning.   Yes [provider]  losartan (COZAAR) 25 MG tablet Take 1 tablet (25 mg total) by mouth at bedtime. 07/17/22  Yes Sowles, Drue Stager, MD  nystatin (MYCOSTATIN) 100000 UNIT/ML suspension Take 5 mLs (500,000 Units total) by mouth 4 (four) times daily. Swish and spit 06/02/22  Yes Verlon Au, NP  pantoprazole (PROTONIX) 40 MG tablet TAKE 1 TABLET BY MOUTH DAILY 07/06/22  Yes Bo Merino, FNP  pioglitazone (ACTOS) 15 MG tablet Take 1 tablet (15 mg total) by mouth daily. 07/17/22  Yes Sowles, Drue Stager, MD  rosuvastatin (CRESTOR) 40 MG tablet Take 1 tablet (40 mg total) by mouth every morning. 07/17/22  Yes Sowles, Drue Stager, MD  sertraline (ZOLOFT) 100 MG tablet Take 200 mg by mouth every morning.   Yes [place].  blood glucose meter kit and supplies Dispense based on patient and insurance preference. Use up to four times daily as directed. (FOR ICD-10 E10.9, E11.9). 03/15/22   Steele Sizer, MD  buPROPion (WELLBUTRIN XL) 150 MG 24 hr tablet Take 150 mg by mouth every morning. Patient not taking: Reported on 09/20/2022 04/09/18   [place].  capecitabine (XELODA) 500 MG tablet Take 2 tablets (1,000 mg total) by mouth 2 (two) times daily after a meal. Take along with 174m tablets. Take Monday- Friday. Take only on days of radiation. 06/26/22   YEarlie Server MD  NON FORMULARY Pt uses a cpap nightly    [provider]  ondansetron (ZOFRAN) 8 MG tablet Take 1 tablet (8 mg total) by mouth 2 (two) times daily as needed for refractory nausea / vomiting. Start on day 3 after chemotherapy. 05/04/22   YEarlie Server MD  paliperidone (INVEGA SUSTENNA) 234 MG/1.5ML SUSY injection Inject 234 mg into the  muscle every 30 (thirty) days. On or about the 14th of each month    ICentral [place] Pediatric Multivit-Minerals-C (CHEWABLES MULTIVITAMIN PO) Take 1 tablet by mouth daily.    [provider]  prochlorperazine (COMPAZINE) 10 MG tablet  Take 1 tablet (10 mg total) by mouth every 6 (six) hours as needed for nausea or vomiting. 05/04/22   [name], MD  Spacer/Aero-Holding Chambers (AEROCHAMBER MV) inhaler Use as instructed 04/13/22   Chesley Mires, MD    Physical Exam: Vitals:   09/20/22 1311 09/20/22 1322 09/20/22 1324 09/20/22 1336  BP:      Pulse: (!) 101 98 95   Resp: '18 18 16   ' Temp:    98 F (36.7 C)  TempSrc:    Oral  SpO2: 93% 94% 100%   Weight:      Height:       Physical Exam Vitals and nursing note reviewed.  Constitutional:      Appearance: She is well-developed.  HENT:     Mouth/Throat:     Mouth: Mucous membranes are moist.  Eyes:     Pupils: Pupils are equal, round, and reactive to light.  Cardiovascular:     Rate and Rhythm: Tachycardia present.  Pulmonary:     Effort: Pulmonary effort is normal.     Breath sounds: Normal breath sounds.  Abdominal:     General: Bowel sounds are normal.     Palpations: Abdomen is soft.  Musculoskeletal:        General: Normal range of motion.     Cervical back: Normal range of motion and neck supple.  Skin:    General: Skin is warm and dry.  Neurological:     General: No focal deficit present.     Mental Status: She is alert.  Psychiatric:        Mood and Affect: Mood normal.        Behavior: Behavior normal.     Data Reviewed: Relevant notes from primary care and specialist visits, past discharge summaries as available in EHR, including Care Everywhere. Prior diagnostic testing as pertinent to current admission diagnoses Updated medications and problem lists for reconciliation ED course, including vitals, labs, imaging, treatment and response to treatment Triage notes, nursing and pharmacy notes  and ED provider's notes Notable results as noted in HPI Labs reviewed.  Sodium 138, potassium 3.9, chloride 108, bicarb 21, glucose 177, BUN 19, creatinine 0.83, calcium 9.4, troponin 3, white count 8.0, hemoglobin 14.4, hematocrit 42.5, platelet count 137 Chest x-ray reviewed by me No active cardiopulmonary disease. CT angiogram of the chest shows no evidence of pulmonary artery embolism. There is no evidence of thoracic aortic dissection.Small linear patchy alveolar infiltrate is seen in medial segment of right middle lobe suggesting atelectasis/pneumonia. Follow-up studies should be considered to assess resolution. Twelve-lead EKG reviewed by me shows sinus tachycardia with low voltage QRS. There are no new results to review at this time.  Assessment and Plan: * COPD with acute exacerbation ([place]) Patient with a known history of COPD and continued nicotine use who presents for evaluation of worsening shortness of breath associated with dry cough and wheezing. Place patient on Solu-Medrol 40 mg IV twice daily Continue inhaled steroids Place patient on as needed as well as scheduled bronchodilator therapy Continue oxygen supplementation to maintain pulse oximetry greater than 92%  Tobacco use Smoking cessation has been discussed with patient in detail We will place on a nicotine transdermal patch 21 mg daily.  Rectal cancer ([place]) Stage III.  Currently on adjuvant chemotherapy. S/p FOLFOX x 4 cycles Follow-up with oncology as an outpatient  Obstructive sleep apnea syndrome Continue CPAP at bedtime  Type 2 diabetes mellitus with microalbuminuria, without long-term current use of insulin ([place])  Hold oral hypoglycemic agents Place patient on consistent carbohydrate diet Glycemic control with sliding scale insulin  Essential hypertension Blood pressure is stable Continue Cozaar   Hypothyroidism Stable Continue Synthroid      Advance Care Planning:   Code Status: Full Code    Consults: None  Family Communication: Greater than 50% of time was spent discussing patient's condition and plan of care with her at the bedside.  All questions and concerns have been addressed.  She verbalizes understanding and agrees with the plan.  Severity of Illness: The appropriate patient status for this patient is INPATIENT. Inpatient status is judged to be reasonable and necessary in order to provide the required intensity of service to ensure the patient's safety. The patient's presenting symptoms, physical exam findings, and initial radiographic and laboratory data in the context of their chronic comorbidities is felt to place them at high risk for further clinical deterioration. Furthermore, it is not anticipated that the patient will be medically stable for discharge from the hospital within 2 midnights of admission.   * I certify that at the point of admission it is my clinical judgment that the patient will require inpatient hospital care spanning beyond 2 midnights from the point of admission due to high intensity of service, high risk for further deterioration and high frequency of surveillance required.*  Author: Collier Bullock, MD 09/20/2022 2:29 PM  For on call review [email or web address].

## 2022-09-21 ENCOUNTER — Encounter: Payer: Self-pay | Admitting: Internal Medicine

## 2022-09-21 ENCOUNTER — Other Ambulatory Visit (HOSPITAL_COMMUNITY): Payer: Self-pay

## 2022-09-21 ENCOUNTER — Other Ambulatory Visit: Payer: Self-pay | Admitting: Oncology

## 2022-09-21 DIAGNOSIS — C20 Malignant neoplasm of rectum: Secondary | ICD-10-CM | POA: Diagnosis not present

## 2022-09-21 DIAGNOSIS — I10 Essential (primary) hypertension: Secondary | ICD-10-CM

## 2022-09-21 DIAGNOSIS — J9601 Acute respiratory failure with hypoxia: Secondary | ICD-10-CM

## 2022-09-21 DIAGNOSIS — Z720 Tobacco use: Secondary | ICD-10-CM | POA: Diagnosis not present

## 2022-09-21 DIAGNOSIS — E1165 Type 2 diabetes mellitus with hyperglycemia: Secondary | ICD-10-CM

## 2022-09-21 DIAGNOSIS — E039 Hypothyroidism, unspecified: Secondary | ICD-10-CM

## 2022-09-21 DIAGNOSIS — J441 Chronic obstructive pulmonary disease with (acute) exacerbation: Secondary | ICD-10-CM | POA: Diagnosis not present

## 2022-09-21 DIAGNOSIS — J181 Lobar pneumonia, unspecified organism: Secondary | ICD-10-CM

## 2022-09-21 DIAGNOSIS — G4733 Obstructive sleep apnea (adult) (pediatric): Secondary | ICD-10-CM

## 2022-09-21 LAB — GLUCOSE, CAPILLARY
Glucose-Capillary: 183 mg/dL — ABNORMAL HIGH (ref 70–99)
Glucose-Capillary: 220 mg/dL — ABNORMAL HIGH (ref 70–99)
Glucose-Capillary: 243 mg/dL — ABNORMAL HIGH (ref 70–99)
Glucose-Capillary: 194 mg/dL — ABNORMAL HIGH (ref 70–99)

## 2022-09-21 LAB — RESPIRATORY PANEL BY PCR

## 2022-09-21 LAB — HIV ANTIBODY (ROUTINE TESTING W REFLEX): HIV Screen 4th Generation wRfx: NONREACTIVE

## 2022-09-21 LAB — SARS CORONAVIRUS 2 BY RT PCR: SARS Coronavirus 2 by RT PCR: NEGATIVE

## 2022-09-21 MED ORDER — DAPAGLIFLOZIN PROPANEDIOL 10 MG PO TABS
10.0000 mg | ORAL_TABLET | Freq: Every day | ORAL | Status: DC
  Administered 2022-09-22 – 2022-09-24 (×3): 10 mg via ORAL
  Filled 2022-09-21 (×4): qty 1

## 2022-09-21 MED ORDER — LEVOFLOXACIN IN D5W 750 MG/150ML IV SOLN
750.0000 mg | INTRAVENOUS | Status: DC
  Administered 2022-09-21 – 2022-09-22 (×2): 750 mg via INTRAVENOUS
  Filled 2022-09-21 (×2): qty 150

## 2022-09-21 MED ORDER — CHLORHEXIDINE GLUCONATE CLOTH 2 % EX PADS
6.0000 | MEDICATED_PAD | Freq: Every day | CUTANEOUS | Status: DC
  Administered 2022-09-21 – 2022-09-23 (×3): 6 via TOPICAL

## 2022-09-21 MED ORDER — PIOGLITAZONE HCL 15 MG PO TABS
15.0000 mg | ORAL_TABLET | Freq: Every day | ORAL | Status: DC
  Filled 2022-09-21: qty 1

## 2022-09-21 MED ORDER — METHYLPREDNISOLONE SODIUM SUCC 125 MG IJ SOLR
60.0000 mg | Freq: Every day | INTRAMUSCULAR | Status: DC
  Administered 2022-09-21 – 2022-09-24 (×4): 60 mg via INTRAVENOUS
  Filled 2022-09-21 (×4): qty 2

## 2022-09-21 NOTE — Progress Notes (Addendum)
  Progress Note   Patient: April Luna MVH:[id number] DOB: [birth year] DOA: 09/20/2022     1 DOS: the patient was seen and examined on 09/21/2022     Assessment and Plan: * COPD with acute exacerbation ([place]) Continue Solu-Medrol on a daily basis.  Continue nebulizer treatments.  Continue Dulera.  Acute respiratory failure with hypoxia (HCC) Pulse ox 88% on room air on presentation.  Try to taper off oxygen.  Lobar pneumonia ([place]) Seen on CT scan but not chest x-ray.  Levaquin started  Tobacco use Continue nicotine patch.  Rectal cancer ([place]) Stage III.  Follow-up with Dr. Fritz Pickerel as outpatient.  Patient states her next chemo scheduled for 10/03/2022  Obstructive sleep apnea syndrome Continue CPAP at bedtime  Uncontrolled type 2 diabetes mellitus with hyperglycemia, without long-term current use of insulin (HCC) Restart Actos and continue sliding scale insulin.  Last hemoglobin A1c couple months ago was 7.4.  Sugars higher secondary to steroids.  Essential hypertension Continue Cozaar   Hypothyroidism Continue Synthroid        Subjective: Patient feels a little bit better than when she came in.  Still coughing and short of breath and has lots of readings.  She is a smoker about a pack a day.  She does not wear oxygen at home.  Admitted with COPD exacerbation and found to have pneumonia on CT scan.  Physical Exam: Vitals:   09/21/22 0107 09/21/22 0430 09/21/22 0817 09/21/22 1235  BP: 94/65 110/76 115/69 123/73  Pulse: 98 (!) 101 (!) 103 (!) 110  Resp: '16 18 18 18  '$ Temp: 98 F (36.7 C) 97.9 F (36.6 C) 97.7 F (36.5 C) 98.5 F (36.9 C)  TempSrc: Oral     SpO2: 96% 96% 92% 93%  Weight:      Height:       Physical Exam HENT:     Head: Normocephalic.     Mouth/Throat:     Pharynx: No oropharyngeal exudate.  Eyes:     General: Lids are normal.     Conjunctiva/sclera: Conjunctivae normal.  Cardiovascular:     Rate and Rhythm: Normal rate and regular  rhythm.     Heart sounds: Normal heart sounds, S1 normal and S2 normal.  Pulmonary:     Breath sounds: Examination of the right-middle field reveals decreased breath sounds and wheezing. Examination of the left-middle field reveals decreased breath sounds and wheezing. Examination of the right-lower field reveals decreased breath sounds and wheezing. Examination of the left-lower field reveals decreased breath sounds and wheezing. Decreased breath sounds and wheezing present. No rhonchi or rales.  Abdominal:     Palpations: Abdomen is soft.     Tenderness: There is no abdominal tenderness.  Musculoskeletal:     Right lower leg: No swelling.     Left lower leg: No swelling.  Skin:    General: Skin is warm.     Findings: No rash.  Neurological:     Mental Status: She is alert and oriented to person, place, and time.     Data Reviewed: CT scan negative for pulmonary embolism but did show pneumonia   Disposition: Status is: Inpatient Remains inpatient appropriate because: Treating for COPD exacerbation with IV steroids and antibiotics  Planned Discharge Destination: Home    Time spent: 28 minutes  Author: Loletha Grayer, MD 09/21/2022 5:29 PM  For on call review [email or web address].

## 2022-09-21 NOTE — Assessment & Plan Note (Addendum)
Seen on CT scan but not chest x-ray.  Case discussed with pharmacist and antibiotic switched over to Rocephin and Zithromax.

## 2022-09-21 NOTE — Progress Notes (Signed)
Pharmacy Antibiotic Note  April Luna is a 51 y.o. female admitted on 09/20/2022 with CAP.  Pharmacy has been consulted for levaquin dosing.  Plan: Levaquin 750 mg IV Q24H X 5 D  ordered to start on 9/28 @ 0800.   Height: '4\' 11"'$  (149.9 cm) Weight: 58.7 kg (129 lb 6.6 oz) IBW/kg (Calculated) : 43.2  Temp (24hrs), Avg:98.1 F (36.7 C), Min:97.6 F (36.4 C), Max:98.9 F (37.2 C)  Recent Labs  Lab 09/20/22 0932  WBC 8.0  CREATININE 0.83    Estimated Creatinine Clearance: 63.9 mL/min (by C-G formula based on SCr of 0.83 mg/dL).    Allergies  Allergen Reactions   Metformin And Related Nausea And Vomiting   Nsaids Other (See Comments)    Stage 3 CKD   Perphenazine Other (See Comments)    Tremors, muscle weakness, tongue swelling   Sulfa Antibiotics Other (See Comments)    GI distress    Abilify [Aripiprazole] Rash   Penicillins Rash    She has taken amoxicillin without problems    Antimicrobials this admission:   >>    >>   Dose adjustments this admission:   Microbiology results:  BCx:   UCx:    Sputum:    MRSA PCR:   Thank you for allowing pharmacy to be a part of this patient's care.  Aki Abalos D 09/21/2022 7:07 AM

## 2022-09-21 NOTE — Progress Notes (Signed)
Pt refused CPAP. Pt states that she will let us know if she changes her mind about wearing one.

## 2022-09-21 NOTE — Assessment & Plan Note (Addendum)
Pulse ox 88% on room air on presentation.  Patient able to come off oxygen

## 2022-09-21 NOTE — TOC Progression Note (Signed)
Transition of Care [facility]) - Progression Note    Patient Details  Name: April Luna MRN: [id number] Date of Birth: 10-18-1971  Transition of Care [facility]) CM/SW Contact  Laurena Slimmer, RN Phone Number: 09/21/2022, 4:27 PM  Clinical Narrative:    Attemmpt to complete risk assessement. Will reattempt at later date.        Expected Discharge Plan and Services                                                 Social Determinants of Health (SDOH) Interventions Transportation Interventions: [facility] Lucianne Lei ([place]. Only)  Readmission Risk Interventions    04/24/2022   10:32 AM  Readmission Risk Prevention Plan  Transportation Screening Complete  PCP or Specialist Appt within 5-7 Days Complete  Home Care Screening Complete  Medication Review (RN CM) Complete

## 2022-09-21 NOTE — Progress Notes (Signed)
   09/21/22 1758  Assess: MEWS Score  Pulse Rate (!) 116  SpO2 90 %  O2 Device Room Air  Patient Activity (if Appropriate) Ambulating  Assess: MEWS Score  MEWS Temp 0  MEWS Systolic 0  MEWS Pulse 2  MEWS RR 0  MEWS LOC 0  MEWS Score 2  MEWS Score Color Yellow  Assess: if the MEWS score is Yellow or Red  Were vital signs taken at a resting state? Yes  Focused Assessment No change from prior assessment  Does the patient meet 2 or more of the SIRS criteria? No  MEWS guidelines implemented *See Row Information* Yes  Escalate  MEWS: Escalate Yellow: discuss with charge nurse/RN and consider discussing with provider and RRT  Notify: Charge Nurse/RN  Name of Charge Nurse/RN Notified amanda rn  Date Charge Nurse/RN Notified 09/21/22  Time Charge Nurse/RN Notified 1800  Assess: SIRS CRITERIA  SIRS Temperature  0  SIRS Pulse 1  SIRS Respirations  0  SIRS WBC 1  SIRS Score Sum  2

## 2022-09-22 DIAGNOSIS — R000 Tachycardia, unspecified: Secondary | ICD-10-CM

## 2022-09-22 DIAGNOSIS — J9601 Acute respiratory failure with hypoxia: Secondary | ICD-10-CM | POA: Diagnosis not present

## 2022-09-22 DIAGNOSIS — E1165 Type 2 diabetes mellitus with hyperglycemia: Secondary | ICD-10-CM | POA: Diagnosis not present

## 2022-09-22 DIAGNOSIS — J441 Chronic obstructive pulmonary disease with (acute) exacerbation: Secondary | ICD-10-CM | POA: Diagnosis not present

## 2022-09-22 LAB — GLUCOSE, CAPILLARY
Glucose-Capillary: 154 mg/dL — ABNORMAL HIGH (ref 70–99)
Glucose-Capillary: 170 mg/dL — ABNORMAL HIGH (ref 70–99)
Glucose-Capillary: 253 mg/dL — ABNORMAL HIGH (ref 70–99)
Glucose-Capillary: 149 mg/dL — ABNORMAL HIGH (ref 70–99)

## 2022-09-22 MED ORDER — AZITHROMYCIN 500 MG PO TABS: 500.0000 mg | ORAL_TABLET | Freq: Every day | ORAL | Status: DC

## 2022-09-22 MED ORDER — DILTIAZEM HCL ER COATED BEADS 120 MG PO CP24
120.0000 mg | ORAL_CAPSULE | Freq: Every day | ORAL | Status: DC
  Administered 2022-09-22 – 2022-09-23 (×2): 120 mg via ORAL
  Filled 2022-09-22 (×2): qty 1

## 2022-09-22 MED ORDER — SODIUM CHLORIDE 0.9 % IV SOLN
2.0000 g | INTRAVENOUS | Status: DC
  Administered 2022-09-23: 2 g via INTRAVENOUS
  Filled 2022-09-22 (×2): qty 20

## 2022-09-22 MED ORDER — SODIUM CHLORIDE 0.9 % IV SOLN: 2.0000 g | INTRAVENOUS | Status: DC

## 2022-09-22 MED ORDER — AZITHROMYCIN 500 MG PO TABS
500.0000 mg | ORAL_TABLET | Freq: Every day | ORAL | Status: DC
  Administered 2022-09-23 – 2022-09-24 (×2): 500 mg via ORAL
  Filled 2022-09-22 (×2): qty 1

## 2022-09-22 NOTE — Care Management Important Message (Signed)
Important Message  Patient Details  Name: April Luna MRN: [id number] Date of Birth: 1971/04/27   Medicare Important Message Given:  Yes     Dannette Barbara 09/22/2022, 11:29 AM

## 2022-09-22 NOTE — Assessment & Plan Note (Addendum)
Start low-dose Cardizem CD.

## 2022-09-22 NOTE — Progress Notes (Signed)
  Progress Note   Patient: April Luna OMV:[id number] DOB: October 02, 1971 DOA: 09/20/2022     2 DOS: the patient was seen and examined on 09/22/2022    Assessment and Plan: * COPD with acute exacerbation ([place]) Continue Solu-Medrol on a daily basis.  Continue nebulizer treatments.  Continue Dulera.  Acute respiratory failure with hypoxia (HCC) Pulse ox 88% on room air on presentation.  Try to taper off oxygen.  Lobar pneumonia ([place]) Seen on CT scan but not chest x-ray.  Case discussed with pharmacist and antibiotic switched over to Rocephin and Zithromax.  Tachycardia Start low-dose Cardizem CD.  Tobacco use Continue nicotine patch.  Rectal cancer ([place]) Stage III.  Follow-up with Dr. Tasia Catchings as outpatient.   Obstructive sleep apnea syndrome Continue CPAP at bedtime  Uncontrolled type 2 diabetes mellitus with hyperglycemia, without long-term current use of insulin ([place]) Restart Farxiga and continue sliding scale insulin.  Last hemoglobin A1c couple months ago was 7.4.  Sugars higher secondary to steroids.  Essential hypertension Continue Cozaar   Hypothyroidism Continue Synthroid        Subjective: Patient still with wheeze and cough and shortness of breath.  Admitted with pneumonia and COPD exacerbation.  Physical Exam: Vitals:   09/22/22 0444 09/22/22 0756 09/22/22 1100 09/22/22 1140  BP: 112/78 117/76  116/79  Pulse: (!) 105 100  (!) 110  Resp: 18 19    Temp: 98.4 F (36.9 C) 97.7 F (36.5 C)  98.7 F (37.1 C)  TempSrc:      SpO2: 94% 94% 93% 90%  Weight:      Height:       Physical Exam HENT:     Head: Normocephalic.     Mouth/Throat:     Pharynx: No oropharyngeal exudate.  Eyes:     General: Lids are normal.     Conjunctiva/sclera: Conjunctivae normal.  Cardiovascular:     Rate and Rhythm: Normal rate and regular rhythm.     Heart sounds: Normal heart sounds, S1 normal and S2 normal.  Pulmonary:     Breath sounds: Examination of the right-middle  field reveals decreased breath sounds and wheezing. Examination of the left-middle field reveals decreased breath sounds and wheezing. Examination of the right-lower field reveals decreased breath sounds and wheezing. Examination of the left-lower field reveals decreased breath sounds and wheezing. Decreased breath sounds and wheezing present. No rhonchi or rales.  Abdominal:     Palpations: Abdomen is soft.     Tenderness: There is no abdominal tenderness.  Musculoskeletal:     Right lower leg: No swelling.     Left lower leg: No swelling.  Skin:    General: Skin is warm.     Findings: No rash.  Neurological:     Mental Status: She is alert and oriented to person, place, and time.     Data Reviewed: Last platelet count 137  Disposition: Status is: Inpatient Remains inpatient appropriate because: Still with bronchospasm and wheeze requiring IV Solu-Medrol.  Planned Discharge Destination: Home    Time spent: 27 minutes  Author: Loletha Grayer, MD 09/22/2022 3:14 PM  For on call review [email or web address].

## 2022-09-23 DIAGNOSIS — D696 Thrombocytopenia, unspecified: Secondary | ICD-10-CM

## 2022-09-23 DIAGNOSIS — J181 Lobar pneumonia, unspecified organism: Secondary | ICD-10-CM

## 2022-09-23 DIAGNOSIS — R000 Tachycardia, unspecified: Secondary | ICD-10-CM | POA: Diagnosis not present

## 2022-09-23 DIAGNOSIS — J9601 Acute respiratory failure with hypoxia: Secondary | ICD-10-CM | POA: Diagnosis not present

## 2022-09-23 DIAGNOSIS — J441 Chronic obstructive pulmonary disease with (acute) exacerbation: Secondary | ICD-10-CM | POA: Diagnosis not present

## 2022-09-23 LAB — CBC
HCT: 39.2 % (ref 36.0–46.0)
Hemoglobin: 13.4 g/dL (ref 12.0–15.0)
MCH: 34.9 pg — ABNORMAL HIGH (ref 26.0–34.0)
MCHC: 34.2 g/dL (ref 30.0–36.0)
MCV: 102.1 fL — ABNORMAL HIGH (ref 80.0–100.0)
Platelets: 124 10*3/uL — ABNORMAL LOW (ref 150–400)
RBC: 3.84 MIL/uL — ABNORMAL LOW (ref 3.87–5.11)
RDW: 17.5 % — ABNORMAL HIGH (ref 11.5–15.5)
WBC: 7.6 10*3/uL (ref 4.0–10.5)
nRBC: 0 % (ref 0.0–0.2)

## 2022-09-23 LAB — BASIC METABOLIC PANEL
Anion gap: 8 (ref 5–15)
BUN: 27 mg/dL — ABNORMAL HIGH (ref 6–20)
CO2: 27 mmol/L (ref 22–32)
Calcium: 9.2 mg/dL (ref 8.9–10.3)
Chloride: 104 mmol/L (ref 98–111)
Creatinine, Ser: 0.91 mg/dL (ref 0.44–1.00)
GFR, Estimated: >60 mL/min (ref >60)
Glucose, Bld: 120 mg/dL — ABNORMAL HIGH (ref 70–99)
Potassium: 3.7 mmol/L (ref 3.5–5.1)
Sodium: 139 mmol/L (ref 135–145)

## 2022-09-23 LAB — GLUCOSE, CAPILLARY
Glucose-Capillary: 140 mg/dL — ABNORMAL HIGH (ref 70–99)
Glucose-Capillary: 215 mg/dL — ABNORMAL HIGH (ref 70–99)
Glucose-Capillary: 301 mg/dL — ABNORMAL HIGH (ref 70–99)
Glucose-Capillary: 189 mg/dL — ABNORMAL HIGH (ref 70–99)

## 2022-09-23 MED ORDER — DILTIAZEM HCL ER COATED BEADS 180 MG PO CP24
180.0000 mg | ORAL_CAPSULE | Freq: Every day | ORAL | Status: DC
  Administered 2022-09-24: 180 mg via ORAL
  Filled 2022-09-23: qty 1

## 2022-09-23 NOTE — Assessment & Plan Note (Signed)
Chronic secondary to rectal cancer

## 2022-09-23 NOTE — Progress Notes (Signed)
  Progress Note   Patient: April Luna DOB: Apr 29, 1971 DOA: 09/20/2022     3 DOS: the patient was seen and examined on 09/23/2022    Assessment and Plan: * COPD with acute exacerbation ([place]) Continue Solu-Medrol on a daily basis.  Continue nebulizer treatments.  Continue Dulera.  Acute respiratory failure with hypoxia (HCC) Pulse ox 88% on room air on presentation.  Try to taper off oxygen.  Lobar pneumonia ([place]) Seen on CT scan but not chest x-ray.  Rocephin and Zithromax.  Tachycardia Increase Cardizem CD to 180 mg daily  Rectal cancer (HCC) Stage III.  Follow-up with Dr. Tasia Catchings as outpatient.   Thrombocytopenia (HCC) Chronic secondary to rectal cancer  Tobacco use Continue nicotine patch.  Obstructive sleep apnea syndrome Continue CPAP at bedtime  Uncontrolled type 2 diabetes mellitus with hyperglycemia, without long-term current use of insulin ([name]) Restart Farxiga and continue sliding scale insulin.  Last hemoglobin A1c couple months ago was 7.4.  Sugars higher secondary to steroids.  Essential hypertension We will discontinue Cozaar since going up on the patient's Cardizem CD  Hypothyroidism Continue Synthroid        Subjective: Patient still wheezing.  Feels a little bit better but still short of breath.  Heart rate still going up with ambulation.  Admitted with COPD and pneumonia.  Physical Exam: Vitals:   09/23/22 0735 09/23/22 1138 09/23/22 1356 09/23/22 1626  BP: 93/73 99/68  112/73  Pulse: 89 100  (!) 101  Resp: '20 18  18  '$ Temp: 98 F (36.7 C) 98.4 F (36.9 C)  98.2 F (36.8 C)  TempSrc:  Oral  Oral  SpO2: 100% 92% 92% (!) 89%  Weight:      Height:       Physical Exam HENT:     Head: Normocephalic.     Mouth/Throat:     Pharynx: No oropharyngeal exudate.  Eyes:     General: Lids are normal.     Conjunctiva/sclera: Conjunctivae normal.  Cardiovascular:     Rate and Rhythm: Normal rate and regular rhythm.     Heart  sounds: Normal heart sounds, S1 normal and S2 normal.  Pulmonary:     Breath sounds: Examination of the right-middle field reveals wheezing. Examination of the left-middle field reveals wheezing. Examination of the right-lower field reveals decreased breath sounds and wheezing. Examination of the left-lower field reveals decreased breath sounds and wheezing. Decreased breath sounds and wheezing present. No rhonchi or rales.  Abdominal:     Palpations: Abdomen is soft.     Tenderness: There is no abdominal tenderness.  Musculoskeletal:     Right lower leg: No swelling.     Left lower leg: No swelling.  Skin:    General: Skin is warm.     Findings: No rash.  Neurological:     Mental Status: She is alert and oriented to person, place, and time.     Data Reviewed: Creatinine 0.9 while on, hemoglobin 13.4, platelet count 124  Disposition: Status is: Inpatient Remains inpatient appropriate because: Patient still wheezing requiring IV Solu-Medrol and nebulizer treatments.  Planned Discharge Destination: Home    Time spent: 29 minutes  Author: Loletha Grayer, MD 09/23/2022 4:38 PM  For on call review [email or web address].

## 2022-09-23 NOTE — Plan of Care (Signed)
  Problem: Activity: Goal: Ability to tolerate increased activity will improve Outcome: Progressing   

## 2022-09-24 DIAGNOSIS — R000 Tachycardia, unspecified: Secondary | ICD-10-CM | POA: Diagnosis not present

## 2022-09-24 DIAGNOSIS — J181 Lobar pneumonia, unspecified organism: Secondary | ICD-10-CM | POA: Diagnosis not present

## 2022-09-24 DIAGNOSIS — J441 Chronic obstructive pulmonary disease with (acute) exacerbation: Secondary | ICD-10-CM | POA: Diagnosis not present

## 2022-09-24 DIAGNOSIS — J9601 Acute respiratory failure with hypoxia: Secondary | ICD-10-CM | POA: Diagnosis not present

## 2022-09-24 LAB — GLUCOSE, CAPILLARY
Glucose-Capillary: 126 mg/dL — ABNORMAL HIGH (ref 70–99)
Glucose-Capillary: 272 mg/dL — ABNORMAL HIGH (ref 70–99)

## 2022-09-24 MED ORDER — CEFDINIR 300 MG PO CAPS: 300.0000 mg | ORAL_CAPSULE | Freq: Two times a day (BID) | ORAL | 0 refills | Status: AC

## 2022-09-24 MED ORDER — DILTIAZEM HCL ER COATED BEADS 180 MG PO CP24: 180.0000 mg | ORAL_CAPSULE | Freq: Every day | ORAL | 0 refills | Status: DC

## 2022-09-24 MED ORDER — ALBUTEROL SULFATE (2.5 MG/3ML) 0.083% IN NEBU: 2.5000 mg | INHALATION_SOLUTION | Freq: Four times a day (QID) | RESPIRATORY_TRACT | 0 refills | Status: DC | PRN

## 2022-09-24 MED ORDER — PREDNISONE 20 MG PO TABS: ORAL_TABLET | ORAL | 0 refills | Status: DC

## 2022-09-24 MED ORDER — CEFDINIR 300 MG PO CAPS
300.0000 mg | ORAL_CAPSULE | Freq: Two times a day (BID) | ORAL | Status: DC
  Administered 2022-09-24: 300 mg via ORAL
  Filled 2022-09-24: qty 1

## 2022-09-24 MED ORDER — NICOTINE 21 MG/24HR TD PT24: MEDICATED_PATCH | TRANSDERMAL | 0 refills | Status: DC

## 2022-09-24 MED ORDER — LOSARTAN POTASSIUM 25 MG PO TABS
25.0000 mg | ORAL_TABLET | Freq: Every day | ORAL | Status: DC
  Administered 2022-09-24: 25 mg via ORAL
  Filled 2022-09-24: qty 1

## 2022-09-24 MED ORDER — NICOTINE POLACRILEX 2 MG MT GUM: 2.0000 mg | CHEWING_GUM | OROMUCOSAL | 0 refills | Status: DC

## 2022-09-24 MED ORDER — AZITHROMYCIN 250 MG PO TABS: 250.0000 mg | ORAL_TABLET | Freq: Every day | ORAL | 0 refills | Status: AC

## 2022-09-24 MED ORDER — HEPARIN SOD (PORK) LOCK FLUSH 100 UNIT/ML IV SOLN
500.0000 [IU] | Freq: Once | INTRAVENOUS | Status: AC
  Administered 2022-09-24: 500 [IU] via INTRAVENOUS
  Filled 2022-09-24: qty 5

## 2022-09-24 NOTE — TOC Initial Note (Signed)
Transition of Care [facility]) - Initial/Assessment Note    Patient Details  Name: April Luna MRN: [id number] Date of Birth: December 04, 1971  Transition of Care [facility]) CM/SW Contact:    April Ivan, LCSW Phone Number: 09/24/2022, 11:42 AM  Clinical Narrative:                 Spoke with patient via phone due to isolation. Patient lives with her husband and son. Uses medical transportation. PCP is Dr. Ancil Luna. Pharmacy is [facility], or [facility] on weekends. Patient states her friend April Luna will pick her up today. MD has ordered nebulizer, referral made to [facility]. CSW explained to patient it may take a few hours to arrive or it can be delivered to the home tomorrow per [place] with [facility]. Patient chose to wait for nebulizer to be delivered to the bedside today. RN notified.   Expected Discharge Plan: Home/Self Care Barriers to Discharge: Barriers Resolved   Patient Goals and CMS Choice Patient states their goals for this hospitalization and ongoing recovery are:: home with family CMS Medicare.gov Compare Post Acute Care list provided to:: Patient Choice offered to / list presented to : Patient  Expected Discharge Plan and Services Expected Discharge Plan: Home/Self Care       Living arrangements for the past 2 months: Single Family Home Expected Discharge Date: 09/24/22               DME Arranged: Nebulizer machine DME Agency: [facility] Date DME Agency Contacted: 09/24/22   Representative spoke with at DME Agency: April Luna            Prior Living Arrangements/Services Living arrangements for the past 2 months: Single Family Home Lives with:: Spouse, Adult Children Patient language and need for interpreter reviewed:: Yes Do you feel safe going back to the place where you live?: Yes      Need for Family Participation in Patient Care: Yes (Comment) Care giver support system in place?: Yes (comment)   Criminal Activity/Legal Involvement  Pertinent to Current Situation/Hospitalization: No - Comment as needed  Activities of Daily Living Home Assistive Devices/Equipment: Ostomy supplies ADL Screening (condition at time of admission) Patient's cognitive ability adequate to safely complete daily activities?: Yes Is the patient deaf or have difficulty hearing?: No Does the patient have difficulty seeing, even when wearing glasses/contacts?: No Does the patient have difficulty concentrating, remembering, or making decisions?: No Patient able to express need for assistance with ADLs?: Yes Does the patient have difficulty dressing or bathing?: No Independently performs ADLs?: Yes (appropriate for developmental age) Does the patient have difficulty walking or climbing stairs?: No Weakness of Legs: Both Weakness of Arms/Hands: None  Permission Sought/Granted Permission sought to share information with : Facility Art therapist granted to share information with : Yes, Verbal Permission Granted     Permission granted to share info w AGENCY: [facility]        Emotional Assessment       Orientation: : Oriented to Self, Oriented to Place, Oriented to  Time, Oriented to Situation Alcohol / Substance Use: Not Applicable Psych Involvement: No (comment)  Admission diagnosis:  COPD exacerbation ([place]) [J44.1] COPD with acute exacerbation ([place]) [J44.1] Patient Active Problem List   Diagnosis Date Noted   Tachycardia 09/22/2022   Acute respiratory failure with hypoxia ([place]) 09/21/2022   Lobar pneumonia ([place]) 09/21/2022   COPD with acute exacerbation ([place]) 09/20/2022   Atherosclerosis of aorta ([place]) 07/17/2022   Stage 3a chronic kidney  disease ([place]) 07/17/2022   Thrombocytopenia ([place]) 06/24/2022   Tobacco use 05/26/2022   Encounter for antineoplastic chemotherapy 05/17/2022   Goals of care, counseling/discussion 05/03/2022   Rectal cancer ([place]) 04/21/2022   Genetic testing 03/27/2022   Family history of colon  cancer 03/14/2022   Family history of uterine cancer 03/14/2022   Family history of breast cancer 03/14/2022   Polyp of ascending colon    Obstructive sleep apnea syndrome 08/11/2021   Plantar callus 10/06/2019   Acquired hallux limitus of both feet 10/06/2019   Uncontrolled type 2 diabetes mellitus with hyperglycemia, without long-term current use of insulin ([place]) 06/24/2019   Chronic obstructive pulmonary disease ([place]) 06/24/2019   Chronic constipation 06/24/2019   ASCUS of cervix with negative high risk HPV 09/05/2018   GERD without esophagitis 04/11/2018   Hyperlipidemia 04/11/2018   Essential hypertension 04/11/2018   Hypothyroidism 04/26/2015   Bipolar I disorder, most recent episode (or current) manic ([place]) 04/25/2015   Delirium due to another medical condition 04/25/2015   Cannabis abuse 04/25/2015   PCP:  April Sizer, MD Pharmacy:   [facility] [place], [place] [place] [address] Phone: [phone] Fax: [phone]  [id number] [address] [address] [id number]: [phone]: [address] [place]: [phone]: [phone]    Social Determinants of Health (SDOH) Interventions Transportation Interventions: CCAR VLucianne Lei([place] Only)  Readmission Risk Interventions    04/24/2022   10:32 AM  Readmission Risk Prevention Plan  Transportation Screening Complete  PCP or Specialist Appt within 5-7 Days Complete  Home Care Screening Complete  Medication Review (RN CM) Complete

## 2022-09-24 NOTE — Discharge Summary (Signed)
Physician Discharge Summary   Patient: April Luna MRN: [id number] DOB: [birth year]  Admit date:     09/20/2022  Discharge date: 09/24/22  Discharge Physician: Loletha Grayer   PCP: Steele Sizer, MD   Recommendations at discharge:   Follow-up PCP 5 days Follow-up Dr. Tasia Catchings oncology  Discharge Diagnoses: Principal Problem:   COPD with acute exacerbation [facility]) Active Problems:   Acute respiratory failure with hypoxia (HCC)   Lobar pneumonia (HCC)   Tachycardia   Rectal cancer (HCC)   Thrombocytopenia ([place])   Hypothyroidism   Essential hypertension   Uncontrolled type 2 diabetes mellitus with hyperglycemia, without long-term current use of insulin ([place])   Obstructive sleep apnea syndrome   Tobacco use    Hospital Course: The patient was admitted on 09/20/2022 and discharged on 09/24/2022.  Came in with shortness of breath and wheezing.  She was started on Solu-Medrol for COPD exacerbation.  CT angiogram of the chest showed no pulmonary embolism or dissection.  Commented on a small linear patch of alveolar intraparenchymal right middle lobe.  Patient was started on Levaquin initially but in speaking with the pharmacist she was okay with Rocephin and Zithromax.  The patient was on oxygen for hypoxia.  Prior to disposition patient was tapered off oxygen.  Switched over to prednisone and Omnicef and Zithromax for another 2 days upon discharge.  The patient was feeling better and stable for discharge on 09/24/2022.  The patient states that she always has some wheeze.  Advised not to go back to smoking.  Assessment and Plan: * COPD with acute exacerbation ([place]) Continue Solu-Medrol on a daily basis.  Continue nebulizer treatments.  Continue Dulera.  Acute respiratory failure with hypoxia (HCC) Pulse ox 88% on room air on presentation.  Try to taper off oxygen.  Lobar pneumonia ([place]) Seen on CT scan but not chest x-ray.  Rocephin and Zithromax.  Tachycardia Increase Cardizem CD  to 180 mg daily  Rectal cancer (HCC) Stage III.  Follow-up with Dr. Tasia Catchings as outpatient.   Thrombocytopenia (HCC) Chronic secondary to rectal cancer  Tobacco use Continue nicotine patch.  Obstructive sleep apnea syndrome Continue CPAP at bedtime  Uncontrolled type 2 diabetes mellitus with hyperglycemia, without long-term current use of insulin ([place]) Restart Farxiga and continue sliding scale insulin.  Last hemoglobin A1c couple months ago was 7.4.  Sugars higher secondary to steroids.  Essential hypertension We will discontinue Cozaar since going up on the patient's Cardizem CD  Hypothyroidism Continue Synthroid         Consultants: None Procedures performed: None Disposition: Home Diet recommendation:  Cardiac and Carb modified diet DISCHARGE MEDICATION: Allergies as of 09/24/2022       Reactions   Metformin And Related Nausea And Vomiting   Nsaids Other (See Comments)   Stage 3 CKD   Perphenazine Other (See Comments)   Tremors, muscle weakness, tongue swelling   Sulfa Antibiotics Other (See Comments)   GI distress   Abilify [aripiprazole] Rash   Penicillins Rash   She has taken amoxicillin without problems        Medication List     STOP taking these medications    capecitabine 150 MG tablet Commonly known as: XELODA   capecitabine 500 MG tablet Commonly known as: XELODA   lidocaine-prilocaine cream Commonly known as: EMLA   nystatin 100000 UNIT/ML suspension Commonly known as: MYCOSTATIN   ondansetron 8 MG tablet Commonly known as: Zofran   pioglitazone 15 MG tablet Commonly known as: Actos  prochlorperazine 10 MG tablet Commonly known as: COMPAZINE       TAKE these medications    acetaminophen 325 MG tablet Commonly known as: TYLENOL Take 650 mg by mouth every 6 (six) hours as needed for headache (pain).   AeroChamber MV inhaler Use as instructed   albuterol 108 (90 Base) MCG/ACT inhaler Commonly known as: Ventolin  HFA Inhale 2 puffs into the lungs every 6 (six) hours as needed for wheezing or shortness of breath. What changed: Another medication with the same name was added. Make sure you understand how and when to take each.   albuterol (2.5 MG/3ML) 0.083% nebulizer solution Commonly known as: PROVENTIL Take 3 mLs (2.5 mg total) by nebulization every 6 (six) hours as needed for shortness of breath or wheezing. What changed: You were already taking a medication with the same name, and this prescription was added. Make sure you understand how and when to take each.   amantadine 100 MG capsule Commonly known as: SYMMETREL Take 100 mg by mouth 2 (two) times daily.   azithromycin 250 MG tablet Commonly known as: ZITHROMAX Take 1 tablet (250 mg total) by mouth daily for 2 days. Start taking on: September 25, 2022   blood glucose meter kit and supplies Dispense based on patient and insurance preference. Use up to four times daily as directed. (FOR ICD-10 E10.9, E11.9).   Breztri Aerosphere 160-9-4.8 MCG/ACT Aero Generic drug: Budeson-Glycopyrrol-Formoterol Inhale 2 puffs into the lungs in the morning and at bedtime.   buPROPion 300 MG 24 hr tablet Commonly known as: WELLBUTRIN XL Take 300 mg by mouth daily. What changed: Another medication with the same name was removed. Continue taking this medication, and follow the directions you see here.   cefdinir 300 MG capsule Commonly known as: OMNICEF Take 1 capsule (300 mg total) by mouth every 12 (twelve) hours for 2 days.   CHEWABLES MULTIVITAMIN PO Take 1 tablet by mouth daily.   diltiazem 180 MG 24 hr capsule Commonly known as: CARDIZEM CD Take 1 capsule (180 mg total) by mouth daily.   docusate sodium 100 MG capsule Commonly known as: Colace Take 1 capsule (100 mg total) by mouth 2 (two) times daily.   Farxiga 10 MG Tabs tablet Generic drug: dapagliflozin propanediol TAKE 1 TABLET BY MOUTH DAILY BEFORE BREAKFAST.   gabapentin 300 MG  capsule Commonly known as: NEURONTIN Take 300 mg by mouth 2 (two) times daily.   Hydrocortisone (Perianal) 1 % Crea Apply topically.   hydrocortisone cream 0.5 % Apply 1 Application topically 2 (two) times daily as needed for itching.   hydrOXYzine 50 MG capsule Commonly known as: VISTARIL Take 50 mg by mouth 3 (three) times daily as needed for anxiety.   icosapent Ethyl 1 g capsule Commonly known as: VASCEPA TAKE 2 CAPSULES BY MOUTH 2 TIMES DAILY   Invega Sustenna 234 MG/1.5ML injection Generic drug: paliperidone Inject 234 mg into the muscle every 30 (thirty) days. On or about the 14th of each month   KETOCONAZOLE (TOPICAL) 1 % Sham Apply 1 each topically 2 (two) times a week.   levothyroxine 75 MCG tablet Commonly known as: SYNTHROID TAKE 1 TABLET BY MOUTH DAILY BEFORE BREAKFAST   loratadine 10 MG tablet Commonly known as: CLARITIN Take 10 mg by mouth every morning.   losartan 25 MG tablet Commonly known as: COZAAR Take 1 tablet (25 mg total) by mouth at bedtime.   nicotine 21 mg/24hr patch Commonly known as: NICODERM CQ - dosed in mg/24  hours Aplly 75m patch chest wall daily (okay to substitute generic)   nicotine polacrilex 2 MG gum Commonly known as: NICORETTE Take 1 each (2 mg total) by mouth every 4 (four) hours while awake.   NON FORMULARY Pt uses a cpap nightly   pantoprazole 40 MG tablet Commonly known as: PROTONIX TAKE 1 TABLET BY MOUTH DAILY   predniSONE 20 MG tablet Commonly known as: DELTASONE Two tabs po daily for two days Start taking on: September 25, 2022   rosuvastatin 40 MG tablet Commonly known as: CRESTOR Take 1 tablet (40 mg total) by mouth every morning.   sertraline 100 MG tablet Commonly known as: ZOLOFT Take 200 mg by mouth every morning.   Vitamin D-3 125 MCG (5000 UT) Tabs Take 5,000 Units by mouth daily.               Durable Medical Equipment  (From admission, onward)           Start     Ordered    09/24/22 0818  For home use only DME Nebulizer/meds  Once       Question Answer Comment  Patient needs a nebulizer to treat with the following condition COPD exacerbation ([place]   Length of Need Lifetime      09/24/22 0817            Discharge Exam: Filed Weights   09/20/22 0924  Weight: 58.7 kg   Physical Exam HENT:     Head: Normocephalic.     Mouth/Throat:     Pharynx: No oropharyngeal exudate.  Eyes:     General: Lids are normal.     Conjunctiva/sclera: Conjunctivae normal.  Cardiovascular:     Rate and Rhythm: Normal rate and regular rhythm.     Heart sounds: Normal heart sounds, S1 normal and S2 normal.  Pulmonary:     Breath sounds: Examination of the right-lower field reveals decreased breath sounds and wheezing. Examination of the left-lower field reveals decreased breath sounds and wheezing. Decreased breath sounds and wheezing present. No rhonchi or rales.  Abdominal:     Palpations: Abdomen is soft.     Tenderness: There is no abdominal tenderness.  Musculoskeletal:     Right lower leg: No swelling.     Left lower leg: No swelling.  Skin:    General: Skin is warm.     Findings: No rash.  Neurological:     Mental Status: She is alert and oriented to person, place, and time.      Condition at discharge: stable  The results of significant diagnostics from this hospitalization (including imaging, microbiology, ancillary and laboratory) are listed below for reference.   Imaging Studies: CT Angio Chest PE W and/or Wo Contrast  Result Date: 09/20/2022 CLINICAL DATA:  Shortness of breath, hypoxia EXAM: CT ANGIOGRAPHY CHEST WITH CONTRAST TECHNIQUE: Multidetector CT imaging of the chest was performed using the standard protocol during bolus administration of intravenous contrast. Multiplanar CT image reconstructions and MIPs were obtained to evaluate the vascular anatomy. RADIATION DOSE REDUCTION: This exam was performed according to the departmental  dose-optimization program which includes automated exposure control, adjustment of the mA and/or kV according to patient size and/or use of iterative reconstruction technique. CONTRAST:  722mOMNIPAQUE IOHEXOL 350 MG/ML SOLN COMPARISON:  Previous CT done on 03/02/2022 and chest radiograph done today FINDINGS: Cardiovascular: There is homogeneous enhancement in thoracic aorta. There are scattered calcifications and atherosclerotic plaques in thoracic aorta. There are no intraluminal filling defects in  pulmonary artery branches. Mediastinum/Nodes: No significant lymphadenopathy seen. Lungs/Pleura: Small linear patchy density is seen in the anteromedial aspect of right mid lung field suggesting atelectasis/pneumonia in right middle lobe. There are faint ground-glass densities in posterior right lower lung field. There is no pleural effusion or pneumothorax. Upper Abdomen: No acute findings are seen. Musculoskeletal: Unremarkable. Review of the MIP images confirms the above findings. IMPRESSION: There is no evidence of pulmonary artery embolism. There is no evidence of thoracic aortic dissection. Small linear patchy alveolar infiltrate is seen in medial segment of right middle lobe suggesting atelectasis/pneumonia. Follow-up studies should be considered to assess resolution. Electronically Signed   By: Elmer Picker M.D.   On: 09/20/2022 11:47   DG Chest 2 View  Result Date: 09/20/2022 CLINICAL DATA:  Shortness of breath. EXAM: CHEST - 2 VIEW COMPARISON:  July 29, 2021. FINDINGS: The heart size and mediastinal contours are within normal limits. Both lungs are clear. Right internal jugular Port-A-Cath is noted with distal tip in expected position of the SVC. The visualized skeletal structures are unremarkable. IMPRESSION: No active cardiopulmonary disease. Electronically Signed   By: Marijo Conception M.D.   On: 09/20/2022 10:21    Microbiology: Results for orders placed or performed during the hospital  encounter of 09/20/22  Respiratory (~20 pathogens) panel by PCR     Status: None   Collection Time: 09/20/22 11:28 PM   Specimen: Nasopharyngeal Swab; Respiratory  Result Value Ref Range Status   Adenovirus NOT DETECTED NOT DETECTED Final   Coronavirus 229E NOT DETECTED NOT DETECTED Final    Comment: (NOTE) The Coronavirus on the Respiratory Panel, DOES NOT test for the novel  Coronavirus (2019 nCoV)    Coronavirus HKU1 NOT DETECTED NOT DETECTED Final   Coronavirus NL63 NOT DETECTED NOT DETECTED Final   Coronavirus OC43 NOT DETECTED NOT DETECTED Final   Metapneumovirus NOT DETECTED NOT DETECTED Final   Rhinovirus / Enterovirus NOT DETECTED NOT DETECTED Final   Influenza A NOT DETECTED NOT DETECTED Final   Influenza B NOT DETECTED NOT DETECTED Final   Parainfluenza Virus 1 NOT DETECTED NOT DETECTED Final   Parainfluenza Virus 2 NOT DETECTED NOT DETECTED Final   Parainfluenza Virus 3 NOT DETECTED NOT DETECTED Final   Parainfluenza Virus 4 NOT DETECTED NOT DETECTED Final   Respiratory Syncytial Virus NOT DETECTED NOT DETECTED Final   Bordetella pertussis NOT DETECTED NOT DETECTED Final   Bordetella Parapertussis NOT DETECTED NOT DETECTED Final   Chlamydophila pneumoniae NOT DETECTED NOT DETECTED Final   Mycoplasma pneumoniae NOT DETECTED NOT DETECTED Final    Comment: Performed at [facility], [address] [place], [place] [address]  SARS Coronavirus 2 by RT PCR (hospital order, performed in [facility] hospital lab) *cepheid single result test* Anterior Nasal Swab     Status: None   Collection Time: 09/21/22  8:00 AM   Specimen: Anterior Nasal Swab  Result Value Ref Range Status   SARS Coronavirus 2 by RT PCR NEGATIVE NEGATIVE Final    Comment: (NOTE) SARS-CoV-2 target nucleic acids are NOT DETECTED.  The SARS-CoV-2 RNA is generally detectable in upper and lower respiratory specimens during the acute phase of infection. The lowest concentration of SARS-CoV-2 viral copies  this assay can detect is 250 copies / mL. A negative result does not preclude SARS-CoV-2 infection and should not be used as the sole basis for treatment or other patient management decisions.  A negative result may occur with improper specimen collection / handling,  submission of specimen other than nasopharyngeal swab, presence of viral mutation(s) within the areas targeted by this assay, and inadequate number of viral copies (<250 copies / mL). A negative result must be combined with clinical observations, patient history, and epidemiological information.  Fact Sheet for Patients:   https://www.patel.info/  Fact Sheet for Healthcare Providers: https://hall.com/  This test is not yet approved or  cleared by the [place] FDA and has been authorized for detection and/or diagnosis of SARS-CoV-2 by FDA under an Emergency Use Authorization (EUA).  This EUA will remain in effect (meaning this test can be used) for the duration of the COVID-19 declaration under Section 564(b)(1) of the Act, 21 U.S.C. section 360bbb-3(b)(1), unless the authorization is terminated or revoked sooner.  Performed at [facility], [address] [place], [place] [address]     Labs: CBC: Recent Labs  Lab 09/20/22 0932 09/23/22 0800  WBC 8.0 7.6  HGB 14.4 13.4  HCT 42.5 39.2  MCV 101.7* 102.1*  PLT 137* 242*   Basic Metabolic Panel: Recent Labs  Lab 09/20/22 0932 09/23/22 0800  NA 138 139  K 3.9 3.7  CL 108 104  CO2 21* 27  GLUCOSE 177* 120*  BUN 19 27*  CREATININE 0.83 0.91  CALCIUM 9.4 9.2   Liver Function Tests: No results for input(s): "AST", "ALT", "ALKPHOS", "BILITOT", "PROT", "ALBUMIN" in the last 168 hours. CBG: Recent Labs  Lab 09/23/22 1143 09/23/22 1627 09/23/22 2045 09/24/22 0840 09/24/22 1219  GLUCAP 215* 301* 189* 126* 272*    Discharge time spent: greater than 30 minutes.  Signed: Loletha Grayer,  MD [facility] 09/24/2022

## 2022-09-24 NOTE — Progress Notes (Signed)
Pt ambulated about 50 ft without 02 - 93% no DOE.

## 2022-09-26 ENCOUNTER — Telehealth: Payer: Self-pay | Admitting: Family Medicine

## 2022-09-26 NOTE — Telephone Encounter (Signed)
Transition Care Management Follow-up Telephone Call Date of discharge and from where: 09/24/2022 from [facility] How have you been since you were released from the hospital? Feeling better Any questions or concerns? No  Items Reviewed: Did the pt receive and understand the discharge instructions provided? Yes  Medications obtained and verified? Yes  Other? No  Any new allergies since your discharge? No  Dietary orders reviewed? Yes Do you have support at home? Yes   Home Care and Equipment/Supplies: Were home health services ordered? no If so, what is the name of the agency?   Has the agency set up a time to come to the patient's home? not applicable Were any new equipment or medical supplies ordered?  No What is the name of the medical supply agency? N/A Were you able to get the supplies/equipment? not applicable Do you have any questions related to the use of the equipment or supplies? No  Functional Questionnaire: (I = Independent and D = Dependent) ADLs: I  Bathing/Dressing- I  Meal Prep- I  Eating- I  Maintaining continence- I  Transferring/Ambulation- I  Managing Meds- I  Follow up appointments reviewed:  PCP Hospital f/u appt confirmed? Yes  Scheduled to see Dr. Ancil Boozer on 10/05/22 @ 10:20 AM. [place] [facility] f/u appt confirmed? N/A Are transportation arrangements needed? No  If their condition worsens, is the pt aware to call PCP or go to the Emergency Dept.? Yes Was the patient provided with contact information for the PCP's office or ED? Yes Was to pt encouraged to call back with questions or concerns? Yes

## 2022-09-28 ENCOUNTER — Emergency Department: Payer: Medicare Other

## 2022-09-28 ENCOUNTER — Ambulatory Visit: Payer: Self-pay | Admitting: *Deleted

## 2022-09-28 ENCOUNTER — Inpatient Hospital Stay
Admission: EM | Admit: 2022-09-28 | Discharge: 2022-09-30 | DRG: 193 | Disposition: A | Discharge status: Home / Self Care | Payer: Medicare Other | Attending: Internal Medicine | Admitting: Internal Medicine

## 2022-09-28 ENCOUNTER — Other Ambulatory Visit: Payer: Self-pay

## 2022-09-28 ENCOUNTER — Encounter: Payer: Self-pay | Admitting: *Deleted

## 2022-09-28 DIAGNOSIS — E039 Hypothyroidism, unspecified: Secondary | ICD-10-CM | POA: Diagnosis present

## 2022-09-28 DIAGNOSIS — D696 Thrombocytopenia, unspecified: Secondary | ICD-10-CM | POA: Diagnosis present

## 2022-09-28 DIAGNOSIS — G4733 Obstructive sleep apnea (adult) (pediatric): Secondary | ICD-10-CM | POA: Diagnosis present

## 2022-09-28 DIAGNOSIS — Z800 Family history of malignant neoplasm of digestive organs: Secondary | ICD-10-CM | POA: Diagnosis not present

## 2022-09-28 DIAGNOSIS — R0602 Shortness of breath: Secondary | ICD-10-CM

## 2022-09-28 DIAGNOSIS — Z87891 Personal history of nicotine dependence: Secondary | ICD-10-CM | POA: Diagnosis not present

## 2022-09-28 DIAGNOSIS — Z8249 Family history of ischemic heart disease and other diseases of the circulatory system: Secondary | ICD-10-CM

## 2022-09-28 DIAGNOSIS — F319 Bipolar disorder, unspecified: Secondary | ICD-10-CM | POA: Diagnosis present

## 2022-09-28 DIAGNOSIS — T451X5A Adverse effect of antineoplastic and immunosuppressive drugs, initial encounter: Secondary | ICD-10-CM | POA: Diagnosis present

## 2022-09-28 DIAGNOSIS — R531 Weakness: Principal | ICD-10-CM

## 2022-09-28 DIAGNOSIS — R918 Other nonspecific abnormal finding of lung field: Secondary | ICD-10-CM

## 2022-09-28 DIAGNOSIS — I129 Hypertensive chronic kidney disease with stage 1 through stage 4 chronic kidney disease, or unspecified chronic kidney disease: Secondary | ICD-10-CM | POA: Diagnosis present

## 2022-09-28 DIAGNOSIS — C20 Malignant neoplasm of rectum: Secondary | ICD-10-CM | POA: Diagnosis present

## 2022-09-28 DIAGNOSIS — K219 Gastro-esophageal reflux disease without esophagitis: Secondary | ICD-10-CM | POA: Diagnosis present

## 2022-09-28 DIAGNOSIS — E1122 Type 2 diabetes mellitus with diabetic chronic kidney disease: Secondary | ICD-10-CM | POA: Diagnosis present

## 2022-09-28 DIAGNOSIS — N1831 Chronic kidney disease, stage 3a: Secondary | ICD-10-CM | POA: Diagnosis present

## 2022-09-28 DIAGNOSIS — Z7951 Long term (current) use of inhaled steroids: Secondary | ICD-10-CM

## 2022-09-28 DIAGNOSIS — J441 Chronic obstructive pulmonary disease with (acute) exacerbation: Secondary | ICD-10-CM | POA: Diagnosis present

## 2022-09-28 DIAGNOSIS — Z7984 Long term (current) use of oral hypoglycemic drugs: Secondary | ICD-10-CM

## 2022-09-28 DIAGNOSIS — D849 Immunodeficiency, unspecified: Secondary | ICD-10-CM | POA: Diagnosis present

## 2022-09-28 DIAGNOSIS — Z882 Allergy status to sulfonamides status: Secondary | ICD-10-CM | POA: Diagnosis not present

## 2022-09-28 DIAGNOSIS — Z886 Allergy status to analgesic agent status: Secondary | ICD-10-CM | POA: Diagnosis not present

## 2022-09-28 DIAGNOSIS — J9601 Acute respiratory failure with hypoxia: Secondary | ICD-10-CM | POA: Diagnosis present

## 2022-09-28 DIAGNOSIS — J181 Lobar pneumonia, unspecified organism: Principal | ICD-10-CM | POA: Diagnosis present

## 2022-09-28 DIAGNOSIS — Y95 Nosocomial condition: Secondary | ICD-10-CM | POA: Diagnosis present

## 2022-09-28 DIAGNOSIS — E785 Hyperlipidemia, unspecified: Secondary | ICD-10-CM | POA: Diagnosis present

## 2022-09-28 DIAGNOSIS — Z888 Allergy status to other drugs, medicaments and biological substances status: Secondary | ICD-10-CM

## 2022-09-28 DIAGNOSIS — E1165 Type 2 diabetes mellitus with hyperglycemia: Secondary | ICD-10-CM | POA: Diagnosis present

## 2022-09-28 DIAGNOSIS — Z20822 Contact with and (suspected) exposure to covid-19: Secondary | ICD-10-CM | POA: Diagnosis present

## 2022-09-28 DIAGNOSIS — J440 Chronic obstructive pulmonary disease with acute lower respiratory infection: Secondary | ICD-10-CM | POA: Diagnosis present

## 2022-09-28 DIAGNOSIS — Z833 Family history of diabetes mellitus: Secondary | ICD-10-CM

## 2022-09-28 DIAGNOSIS — I10 Essential (primary) hypertension: Secondary | ICD-10-CM | POA: Diagnosis present

## 2022-09-28 DIAGNOSIS — Z83438 Family history of other disorder of lipoprotein metabolism and other lipidemia: Secondary | ICD-10-CM

## 2022-09-28 DIAGNOSIS — Z79890 Hormone replacement therapy (postmenopausal): Secondary | ICD-10-CM

## 2022-09-28 DIAGNOSIS — Z79899 Other long term (current) drug therapy: Secondary | ICD-10-CM

## 2022-09-28 DIAGNOSIS — Z880 Allergy status to penicillin: Secondary | ICD-10-CM

## 2022-09-28 DIAGNOSIS — R809 Proteinuria, unspecified: Secondary | ICD-10-CM | POA: Diagnosis present

## 2022-09-28 LAB — CBC
HCT: 41.2 % (ref 36.0–46.0)
Hemoglobin: 14.3 g/dL (ref 12.0–15.0)
MCH: 35.4 pg — ABNORMAL HIGH (ref 26.0–34.0)
MCHC: 34.7 g/dL (ref 30.0–36.0)
MCV: 102 fL — ABNORMAL HIGH (ref 80.0–100.0)
Platelets: 138 10*3/uL — ABNORMAL LOW (ref 150–400)
RBC: 4.04 MIL/uL (ref 3.87–5.11)
RDW: 16.5 % — ABNORMAL HIGH (ref 11.5–15.5)
WBC: 14 10*3/uL — ABNORMAL HIGH (ref 4.0–10.5)
nRBC: 0 % (ref 0.0–0.2)

## 2022-09-28 LAB — BASIC METABOLIC PANEL
Anion gap: 10 (ref 5–15)
BUN: 25 mg/dL — ABNORMAL HIGH (ref 6–20)
CO2: 25 mmol/L (ref 22–32)
Calcium: 9.6 mg/dL (ref 8.9–10.3)
Chloride: 100 mmol/L (ref 98–111)
Creatinine, Ser: 1.09 mg/dL — ABNORMAL HIGH (ref 0.44–1.00)
GFR, Estimated: >60 mL/min (ref >60)
Glucose, Bld: 238 mg/dL — ABNORMAL HIGH (ref 70–99)
Potassium: 4.1 mmol/L (ref 3.5–5.1)
Sodium: 135 mmol/L (ref 135–145)

## 2022-09-28 LAB — URINALYSIS, ROUTINE W REFLEX MICROSCOPIC
Bacteria, UA: NONE SEEN
Bilirubin Urine: NEGATIVE
Glucose, UA: >=500 mg/dL — AB
Hgb urine dipstick: NEGATIVE
Ketones, ur: NEGATIVE mg/dL
Leukocytes,Ua: NEGATIVE
Nitrite: NEGATIVE
Protein, ur: NEGATIVE mg/dL
Specific Gravity, Urine: 1.025 (ref 1.005–1.030)
Squamous Epithelial / HPF: NONE SEEN (ref 0–5)
pH: 6 (ref 5.0–8.0)

## 2022-09-28 LAB — TROPONIN I (HIGH SENSITIVITY)
Troponin I (High Sensitivity): 4 ng/L (ref <18)
Troponin I (High Sensitivity): 3 ng/L (ref <18)

## 2022-09-28 LAB — RESP PANEL BY RT-PCR (FLU A&B, COVID) ARPGX2
Influenza A by PCR: NEGATIVE
Influenza B by PCR: NEGATIVE
SARS Coronavirus 2 by RT PCR: NEGATIVE

## 2022-09-28 LAB — CBG MONITORING, ED: Glucose-Capillary: 225 mg/dL — ABNORMAL HIGH (ref 70–99)

## 2022-09-28 MED ORDER — SODIUM CHLORIDE 0.9 % IV SOLN
2.0000 g | Freq: Two times a day (BID) | INTRAVENOUS | Status: DC
  Administered 2022-09-29 – 2022-09-30 (×3): 2 g via INTRAVENOUS
  Filled 2022-09-28 (×2): qty 12.5
  Filled 2022-09-28 (×2): qty 2

## 2022-09-28 MED ORDER — SODIUM CHLORIDE 0.9 % IV SOLN
500.0000 mg | Freq: Once | INTRAVENOUS | Status: AC
  Administered 2022-09-28: 500 mg via INTRAVENOUS
  Filled 2022-09-28: qty 5

## 2022-09-28 MED ORDER — SODIUM CHLORIDE 0.9 % IV BOLUS
1000.0000 mL | Freq: Once | INTRAVENOUS | Status: AC
  Administered 2022-09-28: 1000 mL via INTRAVENOUS

## 2022-09-28 MED ORDER — METHYLPREDNISOLONE SODIUM SUCC 40 MG IJ SOLR
40.0000 mg | Freq: Two times a day (BID) | INTRAMUSCULAR | Status: DC
  Administered 2022-09-29 (×2): 40 mg via INTRAVENOUS
  Filled 2022-09-28 (×2): qty 1

## 2022-09-28 MED ORDER — SODIUM CHLORIDE 0.9 % IV SOLN
2.0000 g | Freq: Once | INTRAVENOUS | Status: AC
  Administered 2022-09-28: 2 g via INTRAVENOUS
  Filled 2022-09-28: qty 20

## 2022-09-28 MED ORDER — VANCOMYCIN HCL 750 MG/150ML IV SOLN: 750.0000 mg | INTRAVENOUS | Status: DC

## 2022-09-28 MED ORDER — IOHEXOL 350 MG/ML SOLN
75.0000 mL | Freq: Once | INTRAVENOUS | Status: AC | PRN
  Administered 2022-09-28: 75 mL via INTRAVENOUS

## 2022-09-28 MED ORDER — VANCOMYCIN HCL 1250 MG/250ML IV SOLN
1250.0000 mg | Freq: Once | INTRAVENOUS | Status: AC
  Administered 2022-09-29: 1250 mg via INTRAVENOUS
  Filled 2022-09-28: qty 250

## 2022-09-28 NOTE — H&P (Signed)
History and Physical    Patient: April Luna DOB: [birth year] DOA: 09/28/2022 DOS: the patient was seen and examined on 09/28/2022 PCP: Steele Sizer, MD  Patient coming from: Home  Chief Complaint:  Chief Complaint  Patient presents with   Weakness   HPI: April Luna is a 51 y.o. female with medical history significant of recent admission with community-acquired pneumonia, COPD, tobacco abuse, bipolar disorder, hypothyroidism, essential hypertension, colorectal cancer on chemotherapy who was discharged not like to go and now back with complaint of worsening shortness of breath and cough.  Patient was discharged on prednisone dose taper nicotine patch and completing oral antibiotics.  She returned to the ER today with hypoxia and near syncope.  She is weak.  Patient is due for next round of chemotherapy and radiation.  She was feeling better after her discharge from the hospital 4 days ago and now the symptoms.  She was noted to have oxygen sats 87% on room air especially when she talks or any mild activity.  She was tachycardic.  Otherwise stable.  At this point x-ray and CT angiogram of the chest showed no PE but seems to still have infiltrates with suspicion for healthcare associated pneumonia.  She is being admitted for further evaluation and treatment.  Review of Systems: As mentioned in the history of present illness. All other systems reviewed and are negative. Past Medical History:  Diagnosis Date   Allergy    pollen   Anxiety    Arthritis    right hip   Bipolar 1 disorder (HCC)    Cancer (HCC)    rectal   Chronic kidney disease    COPD (chronic obstructive pulmonary disease) (HCC)    Depression    Family history of adverse reaction to anesthesia    grand father had a stroke during anesthesia   Family history of breast cancer    Family history of colon cancer    Family history of uterine cancer    GERD (gastroesophageal reflux disease)     History of kidney stones    Hyperlipidemia    Hypertension    Hypothyroidism    Panic attack    Pneumonia    Psoriasis    Sleep apnea 08/11/2021   No CPAP   Type 2 diabetes mellitus with microalbuminuria, without long-term current use of insulin ([place]) 06/24/2019   Past Surgical History:  Procedure Laterality Date   BREAST BIOPSY Left 12/14/2021   [place] bx, venus marker, path pending   CESAREAN SECTION     COLONOSCOPY WITH PROPOFOL N/A 02/09/2022   Procedure: COLONOSCOPY WITH PROPOFOL;  Surgeon: Lin Landsman, MD;  Location: [place];  Service: Endoscopy;  Laterality: N/A;  sleep apnea   CYSTOSCOPY W/ RETROGRADES Left 11/08/2018   Procedure: CYSTOSCOPY WITH RETROGRADE PYELOGRAM;  Surgeon: Billey Co, MD;  Location: [facility];  Service: Urology;  Laterality: Left;   CYSTOSCOPY/URETEROSCOPY/HOLMIUM LASER/STENT PLACEMENT Left 11/08/2018   Procedure: CYSTOSCOPY/URETEROSCOPY/HOLMIUM LASER/STENT PLACEMENT;  Surgeon: Billey Co, MD;  Location: [facility];  Service: Urology;  Laterality: Left;   IR IMAGING GUIDED PORT INSERTION  05/11/2022   MOUTH SURGERY     wisdom teeth extraction   MOUTH SURGERY     teeth removal   POLYPECTOMY N/A 02/09/2022   Procedure: POLYPECTOMY;  Surgeon: Lin Landsman, MD;  Location: [place];  Service: Endoscopy;  Laterality: N/A;   XI ROBOTIC ASSISTED LOWER ANTERIOR RESECTION N/A 04/21/2022   Procedure: XI ROBOTIC ASSISTED LOWER  ANTERIOR RESECTION WITH COLOSTOMY, BILATERAL TAP BLOCK, ASSESSMENT OF TISSUE PERFUSSION WITH FIREFLY INJECTION;  Surgeon: Ileana Roup, MD;  Location: [facility];  Service: General;  Laterality: N/A;   Social History:  reports that she has quit smoking. Her smoking use included cigarettes. She started smoking about 37 years ago. She has a 8.50 pack-year smoking history. She has never used smokeless tobacco. She reports that she does not currently use alcohol after a past usage of about 4.0 standard  drinks of alcohol per week. She reports that she does not currently use drugs after having used the following drugs: Marijuana.  Allergies  Allergen Reactions   Metformin And Related Nausea And Vomiting   Nsaids Other (See Comments)    Stage 3 CKD   Perphenazine Other (See Comments)    Tremors, muscle weakness, tongue swelling   Sulfa Antibiotics Other (See Comments)    GI distress    Abilify [Aripiprazole] Rash   Penicillins Rash    She has taken amoxicillin without problems    Family History  Problem Relation Age of Onset   Depression Mother    Anxiety disorder Mother    Diabetes Mother    Hypertension Mother    Hyperlipidemia Mother    Cancer Mother    Uterine cancer Mother 53   Cervical cancer Mother 29   Colon cancer Father    Depression Brother    Anxiety disorder Brother    Cancer Maternal Aunt        unk types   Diabetes Mellitus II Maternal Grandmother    Hypercholesterolemia Maternal Grandmother    Breast cancer Maternal Grandmother    Cancer Paternal Grandmother    Diabetes Paternal Grandmother    Melanoma Paternal Grandmother    Stomach cancer Paternal Grandmother     Prior to Admission medications   Medication Sig Start Date End Date Taking? Authorizing Provider  acetaminophen (TYLENOL) 325 MG tablet Take 650 mg by mouth every 6 (six) hours as needed for headache (pain).    [provider]  albuterol (PROVENTIL) (2.5 MG/3ML) 0.083% nebulizer solution Take 3 mLs (2.5 mg total) by nebulization every 6 (six) hours as needed for shortness of breath or wheezing. 09/24/22 10/24/22  Loletha Grayer, MD  albuterol (VENTOLIN HFA) 108 (90 Base) MCG/ACT inhaler Inhale 2 puffs into the lungs every 6 (six) hours as needed for wheezing or shortness of breath. 04/13/22   Chesley Mires, MD  amantadine (SYMMETREL) 100 MG capsule Take 100 mg by mouth 2 (two) times daily. 03/19/18   Charlcie Cradle, MD  blood glucose meter kit and supplies Dispense based on patient and  insurance preference. Use up to four times daily as directed. (FOR ICD-10 E10.9, E11.9). 03/15/22   Steele Sizer, MD  Budeson-Glycopyrrol-Formoterol (BREZTRI AEROSPHERE) 160-9-4.8 MCG/ACT AERO Inhale 2 puffs into the lungs in the morning and at bedtime. 04/13/22   Chesley Mires, MD  buPROPion (WELLBUTRIN XL) 300 MG 24 hr tablet Take 300 mg by mouth daily. 09/11/22   [provider]  Cholecalciferol (VITAMIN D-3) 125 MCG (5000 UT) TABS Take 5,000 Units by mouth daily. 10/14/19   Steele Sizer, MD  diltiazem (CARDIZEM CD) 180 MG 24 hr capsule Take 1 capsule (180 mg total) by mouth daily. 09/24/22   Loletha Grayer, MD  docusate sodium (COLACE) 100 MG capsule Take 1 capsule (100 mg total) by mouth 2 (two) times daily. 08/01/22   [name], MD  FARXIGA 10 MG TABS tablet TAKE 1 TABLET BY MOUTH DAILY BEFORE  BREAKFAST. 09/10/22   Steele Sizer, MD  gabapentin (NEURONTIN) 300 MG capsule Take 300 mg by mouth 2 (two) times daily.    [place].  hydrocortisone cream 0.5 % Apply 1 Application topically 2 (two) times daily as needed for itching. 06/23/22   [name], MD  Hydrocortisone, Perianal, 1 % CREA Apply topically. 06/23/22   [provider]  hydrOXYzine (VISTARIL) 50 MG capsule Take 50 mg by mouth 3 (three) times daily as needed for anxiety.    [place] Ethyl (VASCEPA) 1 g capsule TAKE 2 CAPSULES BY MOUTH 2 TIMES DAILY 07/06/22   Bo Merino, FNP  KETOCONAZOLE, TOPICAL, 1 % SHAM Apply 1 each topically 2 (two) times a week. 09/21/22   Steele Sizer, MD  levothyroxine (SYNTHROID) 75 MCG tablet TAKE 1 TABLET BY MOUTH DAILY BEFORE BREAKFAST 07/06/22   Bo Merino, FNP  loratadine (CLARITIN) 10 MG tablet Take 10 mg by mouth every morning.    [provider]  losartan (COZAAR) 25 MG tablet Take 1 tablet (25 mg total) by mouth at bedtime. 07/17/22   Steele Sizer, MD  nicotine (NICODERM CQ -  DOSED IN MG/24 HOURS) 21 mg/24hr patch [place] 84m patch chest wall daily (okay to substitute generic) 09/24/22   WLoletha Grayer MD  nicotine polacrilex (NICORETTE) 2 MG gum Take 1 each (2 mg total) by mouth every 4 (four) hours while awake. 09/24/22   WLoletha Grayer MD  NON FORMULARY Pt uses a cpap nightly    [provider]  paliperidone (INVEGA SUSTENNA) 234 MG/1.5ML SUSY injection Inject 234 mg into the muscle every 30 (thirty) days. On or about the 14th of each month    IRafael Gonzalez pantoprazole (PROTONIX) 40 MG tablet TAKE 1 TABLET BY MOUTH DAILY 07/06/22   PBo Merino FNP  Pediatric Multivit-Minerals-C (CHEWABLES MULTIVITAMIN PO) Take 1 tablet by mouth daily.    [provider]  predniSONE (DELTASONE) 20 MG tablet Two tabs po daily for two days 09/25/22   WLoletha Grayer MD  rosuvastatin (CRESTOR) 40 MG tablet Take 1 tablet (40 mg total) by mouth every morning. 07/17/22   SSteele Sizer MD  sertraline (ZOLOFT) 100 MG tablet Take 200 mg by mouth every morning.    ERolling Fields(AEROCHAMBER MV) inhaler Use as instructed 04/13/22   SChesley Mires MD    Physical Exam: Vitals:   09/28/22 1710 09/28/22 2145 09/28/22 2146 09/28/22 2151  BP: 105/72 101/65    Pulse: (!) 109 94 93   Resp: 18 18    Temp: 98.2 F (36.8 C)     TempSrc: Oral     SpO2: 94% 91% (!) 89% 93%  Weight:      Height:       Constitutional: Acutely ill looking, NAD, calm, comfortable Eyes: PERRL, lids and conjunctivae normal ENMT: Mucous membranes are moist. Posterior pharynx clear of any exudate or lesions.Normal dentition.  Neck: normal, supple, no masses, no thyromegaly Respiratory: Coarse breath sounds bilaterally, mild wheezing, no crackles. Normal respiratory effort. No accessory muscle use.  Cardiovascular: Sinus tachycardia no murmurs / rubs / gallops. No extremity edema. 2+ pedal pulses. No carotid  bruits.  Abdomen: no tenderness, no masses palpated. No hepatosplenomegaly. Bowel sounds positive.  Musculoskeletal: Good range of motion, no joint swelling or tenderness, Skin: no rashes, lesions, ulcers. No induration  Neurologic: CN 2-12 grossly intact. Sensation intact, DTR normal. Strength 5/5 in all 4.  Psychiatric: Normal judgment and insight. Alert and oriented x 3. Normal mood  Data Reviewed:  Chemistry showed glucose 238, BUN 25 creatinine 1.09, white count 14.0, platelets of 138, COVID-19 and flu screen negative, urinalysis negative, CT angiogram of the chest showed no PE but new groundglass infiltrates anteriorly in bilateral upper lobes possible atypical edema infectious or inflammatory process.  Assessment and Plan:   #1 Healthcare associated pneumonia: Following recent hospitalization.  Patient immunocompromised with plan undergoing chemotherapy.  We will admit.  Initiate Vanco and cefepime.  We will follow blood cultures.  Treat patient for COPD exacerbation.  #2 acute exacerbation of COPD: Continue steroids with antibiotics and breathing treatment.  Patient most likely will need home O2 at discharge.  #3 rectal cancer: On chemotherapy.  Continue per oncology  #4 chronic kidney disease stage IIIa: Continue to monitor.  Appears to be at baseline.  #5 uncontrolled type 2 diabetes: Continue sliding scale insulin  #6 essential hypertension: Continue home regimen.  #7 obstructive sleep apnea: Continue CPAP at night  #8 transient thrombocytopenia: Monitor platelets.  Chronic.  Secondary to chemotherapy.  #9 GERD: Continue with PPI  #10 hypothyroidism: Continue levothyroxine     Advance Care Planning:   Code Status: Prior full code  Consults: None  Family Communication: No family at bedside  Severity of Illness: The appropriate patient status for this patient is INPATIENT. Inpatient status is judged to be reasonable and necessary in order to provide the required  intensity of service to ensure the patient's safety. The patient's presenting symptoms, physical exam findings, and initial radiographic and laboratory data in the context of their chronic comorbidities is felt to place them at high risk for further clinical deterioration. Furthermore, it is not anticipated that the patient will be medically stable for discharge from the hospital within 2 midnights of admission.   * I certify that at the point of admission it is my clinical judgment that the patient will require inpatient hospital care spanning beyond 2 midnights from the point of admission due to high intensity of service, high risk for further deterioration and high frequency of surveillance required.*  AuthorBarbette Merino, MD 09/28/2022 10:47 PM  For on call review [email or web address].

## 2022-09-28 NOTE — ED Triage Notes (Addendum)
Pt reports feeling weak.  Pt discharged from hospital last week. Pt denies chest pain or sob.  Hx rectal cancer.  Last radiation was last week.  Pt alert  speech clear.   Pt has a port

## 2022-09-28 NOTE — ED Provider Notes (Signed)
[facility] Provider Note  Patient Contact: 6:53 PM (approximate)   History   Weakness   HPI  April Luna is a 51 y.o. female who presents the emergency department for increasing weakness, near syncope, increasing shortness of breath.  Patient is currently undergoing treatment for rectal cancer.  She is about to start her next round of chemotherapy and radiation, having finished radiation roughly 3 weeks ago.  Patient was admitted on 27 September for what appears to be a COPD exacerbation and was ultimately found to be pneumonia.  Patient was receiving antibiotics, improving and was discharged 4 days ago.  She states for the first day she felt fine, the next day she started having increasing shortness of breath and weakness.  Yesterday she was going to an appointment, got out of the car and she started walking she had a near syncopal episode.  She states that everything was closing in, she had tunnel vision and she was barely able to get back into the seat of her car before passing out.  She did not actually sustain syncope and after a period of time the sensation went away and patient was able to continue with her activity.  She has had worsening shortness of breath today, worsening malaise.  No reported fevers.  She does have some shortness of breath and mild cough.  No nasal congestion, sore throat.  No emesis, diarrhea, constipation.  No urinary changes at this time.  During my discussion with the patient, she states that she was having periods where the monitor was going off due to hypoxia.  In the short sentences that the patient was talking her O2 sat fell to 87%.  She does arrive tachycardic, was borderline hypoxic which is worsened with talking.  Her blood pressure is somewhat soft at 105/72.     Physical Exam   Triage Vital Signs: ED Triage Vitals  Enc Vitals Group     BP 09/28/22 1710 105/72     Pulse Rate 09/28/22 1710 (!) 109     Resp 09/28/22  1710 18     Temp 09/28/22 1710 98.2 F (36.8 C)     Temp Source 09/28/22 1710 Oral     SpO2 09/28/22 1710 94 %     Weight 09/28/22 1709 130 lb (59 kg)     Height 09/28/22 1709 '4\' 11"'$  (1.499 m)     Head Circumference --      Peak Flow --      Pain Score 09/28/22 1709 2     Pain Loc --      Pain Edu? --      Excl. in [place]? --     Most recent vital signs: Vitals:   09/28/22 2146 09/28/22 2151  BP:    Pulse: 93   Resp:    Temp:    SpO2: (!) 89% 93%     General: Alert and in no acute distress. ENT:      Ears:       Nose: No congestion/rhinnorhea.      Mouth/Throat: Mucous membranes are moist. Neck: No stridor. Hematological/Lymphatic/Immunilogical: No cervical lymphadenopathy. Cardiovascular:  Good peripheral perfusion Respiratory: Normal respiratory effort without tachypnea or retractions.  Appears slightly winded during conversations, does have hypoxia on the monitor down into the 80s during talking.  Lungs with some mild lower lung field wheeze and mild crackles. Good air entry to the bases with no decreased or absent breath sounds. Gastrointestinal: Bowel sounds 4 quadrants. Soft  and nontender to palpation. No guarding or rigidity. No palpable masses. No distention.  Musculoskeletal: Full range of motion to all extremities.  Neurologic:  No gross focal neurologic deficits are appreciated.  Skin:   No rash noted Other:   ED Results / Procedures / Treatments   Labs (all labs ordered are listed, but only abnormal results are displayed) Labs Reviewed  BASIC METABOLIC PANEL - Abnormal; Notable for the following components:      Result Value   Glucose, Bld 238 (*)    BUN 25 (*)    Creatinine, Ser 1.09 (*)    All other components within normal limits  CBC - Abnormal; Notable for the following components:   WBC 14.0 (*)    MCV 102.0 (*)    MCH 35.4 (*)    RDW 16.5 (*)    Platelets 138 (*)    All other components within normal limits  URINALYSIS, ROUTINE W REFLEX  MICROSCOPIC - Abnormal; Notable for the following components:   Color, Urine YELLOW (*)    APPearance CLEAR (*)    Glucose, UA >=500 (*)    All other components within normal limits  CBG MONITORING, ED - Abnormal; Notable for the following components:   Glucose-Capillary 225 (*)    All other components within normal limits  CULTURE, BLOOD (ROUTINE X 2)  CULTURE, BLOOD (ROUTINE X 2)  RESP PANEL BY RT-PCR (FLU A&B, COVID) [id number]  TROPONIN I (HIGH SENSITIVITY)  TROPONIN I (HIGH SENSITIVITY)     EKG  ED ECG REPORT I, Charline Bills Marilou Barnfield,  personally viewed and interpreted this ECG.   Date: 09/28/2022  EKG Time: 1722 hrs.  Rate: 100 bpm  Rhythm: unchanged from previous tracings, normal sinus rhythm, no significant change from previous EKG from 09/21/2022  Axis: Normal  Intervals:none  ST&T Change: No ST elevation or depression noted  Normal sinus rhythm.  No STEMI.  No significant changes from previous EKGs on record.    RADIOLOGY  I personally viewed, evaluated, and interpreted these images as part of my medical decision making, as well as reviewing the written report by the radiologist.  ED Provider Interpretation: No acute intracranial abnormality.  No significant findings on chest x-ray.  CT Angio Chest PE W and/or Wo Contrast  Result Date: 09/28/2022 CLINICAL DATA:  Weakness, history of rectal carcinoma EXAM: CT ANGIOGRAPHY CHEST WITH CONTRAST TECHNIQUE: Multidetector CT imaging of the chest was performed using the standard protocol during bolus administration of intravenous contrast. Multiplanar CT image reconstructions and MIPs were obtained to evaluate the vascular anatomy. RADIATION DOSE REDUCTION: This exam was performed according to the departmental dose-optimization program which includes automated exposure control, adjustment of the mA and/or kV according to patient size and/or use of iterative reconstruction technique. CONTRAST:  64m OMNIPAQUE IOHEXOL 350 MG/ML  SOLN COMPARISON:  09/20/2022 FINDINGS: Cardiovascular: Right IJ port catheter to the cavoatrial junction. Heart size normal. Trace pericardial fluid. The RV is nondilated. Satisfactory opacification of pulmonary arteries noted, and there is no evidence of pulmonary emboli. Adequate contrast opacification of the thoracic aorta with no evidence of dissection, aneurysm, or stenosis. There is classic 3-vessel brachiocephalic arch anatomy without proximal stenosis. Minimal calcified atheromatous plaque in the arch and descending thoracic aorta. Mediastinum/Nodes: No mass or adenopathy. Lungs/Pleura: No pleural effusion. No pneumothorax. Scattered ground-glass infiltrate in the anterior segments of both upper lobes, new since previous. Upper Abdomen: No acute findings. Musculoskeletal: No chest wall abnormality. No acute or significant osseous findings. Review of the MIP  images confirms the above findings. IMPRESSION: 1. Negative for acute PE or thoracic aortic dissection. 2. New ground-glass infiltrates anteriorly in bilateral upper lobes, may represent atypical edema, infectious or inflammatory process. 3.  Aortic Atherosclerosis (ICD10-170.0). Electronically Signed   By: Lucrezia Europe M.D.   On: 09/28/2022 20:55   CT Head Wo Contrast  Result Date: 09/28/2022 CLINICAL DATA:  Worsening headaches and syncopal episode, initial encounter EXAM: CT HEAD WITHOUT CONTRAST TECHNIQUE: Contiguous axial images were obtained from the base of the skull through the vertex without intravenous contrast. RADIATION DOSE REDUCTION: This exam was performed according to the departmental dose-optimization program which includes automated exposure control, adjustment of the mA and/or kV according to patient size and/or use of iterative reconstruction technique. COMPARISON:  02/02/2017 FINDINGS: Brain: No evidence of acute infarction, hemorrhage, hydrocephalus, extra-axial collection or mass lesion/mass effect. Vascular: No hyperdense vessel  or unexpected calcification. Skull: Normal. Negative for fracture or focal lesion. Sinuses/Orbits: No acute finding. Other: None. IMPRESSION: No acute intracranial abnormality noted. Electronically Signed   By: Inez Catalina M.D.   On: 09/28/2022 19:30   DG Chest 2 View  Result Date: 09/28/2022 CLINICAL DATA:  Weakness EXAM: CHEST - 2 VIEW COMPARISON:  Chest x-ray 06/14/2018 FINDINGS: Right chest port catheter tip projects over the SVC, unchanged. The heart size and mediastinal contours are within normal limits. Both lungs are clear. The visualized skeletal structures are unremarkable. IMPRESSION: No active cardiopulmonary disease. Electronically Signed   By: Ronney Asters M.D.   On: 09/28/2022 18:12    PROCEDURES:  Critical Care performed: No  Procedures   MEDICATIONS ORDERED IN ED: Medications  azithromycin (ZITHROMAX) 500 mg in sodium chloride 0.9 % 250 mL IVPB (500 mg Intravenous New Bag/Given 09/28/22 2143)  sodium chloride 0.9 % bolus 1,000 mL (0 mLs Intravenous Stopped 09/28/22 2033)  cefTRIAXone (ROCEPHIN) 2 g in sodium chloride 0.9 % 100 mL IVPB (0 g Intravenous Stopped 09/28/22 2051)  iohexol (OMNIPAQUE) 350 MG/ML injection 75 mL (75 mLs Intravenous Contrast Given 09/28/22 2038)      Pulmonary Embolism Rule-out Criteria (PERC rule)                        If YES to ANY of the following, the PERC rule is not satisfied and cannot be used to rule out PE in this patient (consider d-dimer or imaging depending on pre-test probability).                      If NO to ALL of the following, AND the clinician's pre-test probability is <15%, the [facility] rule is satisfied and there is no need for further workup (including no need to obtain a d-dimer) as the post-test probability of pulmonary embolism is <2%.                      Mnemonic is HAD CLOTS   H - hormone use (exogenous estrogen)      No. A - age > 57                                                 Yes.   D - DVT/PE history  No.   C - coughing blood (hemoptysis)                 No. L - leg swelling, unilateral                             No. O - O2 Sat on Room Air < 95%                  Yes.   T - tachycardia (HR ? 100)                         Yes.   S - surgery or trauma, recent                      No.   Based on my evaluation of the patient, including application of this decision instrument, further testing to evaluate for pulmonary embolism is indicated at this time. I have discussed this recommendation with the patient who states understanding and agreement with this plan.   IMPRESSION / MDM / ASSESSMENT AND PLAN / ED COURSE  I reviewed the triage vital signs and the nursing notes.                              Differential diagnosis includes, but is not limited to, sepsis, pneumonia, COPD exacerbation, STEMI, PE  The patient is on the cardiac monitor to evaluate for evidence of arrhythmia and/or significant heart rate changes.  Patient's presentation is most consistent with acute presentation with potential threat to life or bodily function.   Patient's diagnosis is consistent with shortness of breath, bilateral infiltrates, weakness.  Patient presented to the emergency department with worsening shortness of breath, hypoxia, tachycardia.  She was recently admitted for pneumonia, discharged with 2 days of antibiotics.  First day patient was feeling better but over the last 3 she has been worsening.  Had a near syncopal episode yesterday.  Troponin, EKG, chest x-ray are reassuring.  Patient will have CT scan as she is hypoxic, tachycardic in history of malignancy.  At this time with her immunosuppression secondary to cancer and treatments, elevation of the white blood cell count, increasing shortness of breath and bilateral infiltrates I will admit to the hospitalist.  At this time differential once still includes viral process worsening COPD versus bacterial process.  As she did arrive  with elevation of the white blood cell count, tachycardia, hypoxia with worsening infiltrates I will cover with antibiotics.  Will admit to the hospitalist team at this time..    Clinical Course as of 09/28/22 2223  Thu Sep 28, 2022  2013 Patient presents the emergency department with increased shortness of breath, increased weakness, near syncope.  Patient is currently undergoing treatment for rectal cancer.  Extensive medical history.  Patient was recently admitted for COPD exacerbation with findings consistent with pneumonia as well.  She was receiving antibiotics as well as steroids during her admission.  Discharged 4 days ago, states that for the first day she felt well, the next day she started to feel more weak and short of breath which has been progressing.  Yesterday she had a near syncopal episode.  Patient with no chest pain.  Arrives tachycardic, borderline hypoxic but is noted to drop into the 80s with just talking.  Patient has a soft blood pressure of 105/72.  White blood cell count was elevated at 14 which meets SIRS criteria.  However patient has also received steroids recently.  Given the hypoxia, tachycardia, shortness of breath I will also order CT scan to better assess lungs as his chest x-ray is reassuring with no evidence of pneumonia.  Also evaluate for PE given the patient's history, vital signs.  We will go ahead and start antibiotics for supposed pneumonia during this work-up. [JC]    Clinical Course User Index [JC] April Luna, Charline Bills, PA-C     FINAL CLINICAL IMPRESSION(S) / ED DIAGNOSES   Final diagnoses:  Generalized weakness  Shortness of breath  Pulmonary infiltrates     Rx / DC Orders   ED Discharge Orders     None        Note:  This document was prepared using Dragon voice recognition software and may include unintentional dictation errors.   Brynda Peon 09/28/22 2223    Harvest Dark, MD 10/08/22 1446

## 2022-09-28 NOTE — Telephone Encounter (Signed)
  Chief Complaint: Numbness Symptoms: Both hands numb,"Completely"  top of feet "Feel hollow"  Yesterday "Entire body felt hollow and I couldn't move, almost passed out, fell into car." States presently feels "Very strange" Dizzy at times. BP 106/74 HR 84 blood sugar 200. States "Heart pounding, like swelling."  Frequency: Today Pertinent Negatives: Patient denies  Disposition: '[x]'$ ED /'[]'$ Urgent Care (no appt availability in office) / '[]'$ Appointment(In office/virtual)/ '[]'$  [facility]/ '[]'$ Home Care/ '[]'$ Refused Recommended Disposition /'[]'$ [facility]/ '[]'$  Follow-up with PCP Additional Notes: Advised ED, states will follow disposition. Reason for Disposition  [1] Numbness (i.e., loss of sensation) of the face, arm / hand, or leg / foot on one side of the body AND [2] sudden onset AND [3] brief (now gone)    Both hands  Answer Assessment - Initial Assessment Questions 1. SYMPTOM: "What is the main symptom you are concerned about?" (e.g., weakness, numbness)     "Strange sensation in feet and hands" 2. ONSET: "When did this start?" (minutes, hours, days; while sleeping)     Yesterday "Almost feel, legs hollow." 3. LAST NORMAL: "When was the last time you (the patient) were normal (no symptoms)?"     Yesterday 4. PATTERN "Does this come and go, or has it been constant since it started?"  "Is it present now?"      5. CARDIAC SYMPTOMS: "Have you had any of the following symptoms: chest pain, difficulty breathing, palpitations?"      6. NEUROLOGIC SYMPTOMS: "Have you had any of the following symptoms: headache, dizziness, vision loss, double vision, changes in speech, unsteady on your feet?"     Dizziness yesterday 7. OTHER SYMPTOMS: "Do you have any other symptoms?"  Protocols used: Neurologic Deficit-A-AH

## 2022-09-28 NOTE — Progress Notes (Signed)
Pharmacy Antibiotic Note  April Luna is a 51 y.o. female admitted on 09/28/2022 with pneumonia/HCAP.  Pharmacy has been consulted for Cefepime & Vancomycin dosing.  Plan: Cefepime 2 gm q12hr per indication & renal fxn.  Pt given Vancomycin 1250 mg once. Vancomycin 750 mg IV Q 24 hrs. Goal AUC 400-550. Expected AUC: 444.3 SCr used: 1.09  Pharmacy will continue to follow and will adjust abx dosing whenever warranted.  Temp (24hrs), Avg:98.2 F (36.8 C), Min:98.2 F (36.8 C), Max:98.2 F (36.8 C)   Recent Labs  Lab 09/23/22 0800 09/28/22 1716  WBC 7.6 14.0*  CREATININE 0.91 1.09*    Estimated Creatinine Clearance: 48.8 mL/min (A) (by C-G formula based on SCr of 1.09 mg/dL (H)).    Allergies  Allergen Reactions   Metformin And Related Nausea And Vomiting   Nsaids Other (See Comments)    Stage 3 CKD   Perphenazine Other (See Comments)    Tremors, muscle weakness, tongue swelling   Sulfa Antibiotics Other (See Comments)    GI distress    Abilify [Aripiprazole] Rash   Penicillins Rash    She has taken amoxicillin without problems    Antimicrobials this admission: 10/5 Azithromycin >> x 1 dose 10/5 Ceftriaxone >> x 1 dose 10/5 Vancomycin >>  10/6 Cefepime >>  Microbiology results: 10/5 BCx: Pending  Thank you for allowing pharmacy to be a part of this patient's care.  Renda Rolls, PharmD, [facility] 09/28/2022 10:57 PM

## 2022-09-29 ENCOUNTER — Ambulatory Visit: Payer: Medicare Other

## 2022-09-29 DIAGNOSIS — J9601 Acute respiratory failure with hypoxia: Secondary | ICD-10-CM | POA: Diagnosis not present

## 2022-09-29 LAB — RESPIRATORY PANEL BY PCR

## 2022-09-29 LAB — CBC
HCT: 41.2 % (ref 36.0–46.0)
Hemoglobin: 13.9 g/dL (ref 12.0–15.0)
MCH: 35.1 pg — ABNORMAL HIGH (ref 26.0–34.0)
MCHC: 33.7 g/dL (ref 30.0–36.0)
MCV: 104 fL — ABNORMAL HIGH (ref 80.0–100.0)
Platelets: 123 10*3/uL — ABNORMAL LOW (ref 150–400)
RBC: 3.96 MIL/uL (ref 3.87–5.11)
RDW: 16.3 % — ABNORMAL HIGH (ref 11.5–15.5)
WBC: 13.7 10*3/uL — ABNORMAL HIGH (ref 4.0–10.5)
nRBC: 0 % (ref 0.0–0.2)

## 2022-09-29 LAB — HIV ANTIBODY (ROUTINE TESTING W REFLEX): HIV Screen 4th Generation wRfx: NONREACTIVE

## 2022-09-29 LAB — CREATININE, SERUM
Creatinine, Ser: 0.98 mg/dL (ref 0.44–1.00)
GFR, Estimated: >60 mL/min (ref >60)

## 2022-09-29 LAB — MRSA NEXT GEN BY PCR, NASAL: MRSA by PCR Next Gen: NOT DETECTED

## 2022-09-29 LAB — PROCALCITONIN: Procalcitonin: <0.1 ng/mL

## 2022-09-29 LAB — GLUCOSE, CAPILLARY
Glucose-Capillary: 264 mg/dL — ABNORMAL HIGH (ref 70–99)
Glucose-Capillary: 271 mg/dL — ABNORMAL HIGH (ref 70–99)

## 2022-09-29 MED ORDER — DILTIAZEM HCL ER COATED BEADS 180 MG PO CP24
180.0000 mg | ORAL_CAPSULE | Freq: Every day | ORAL | Status: DC
  Administered 2022-09-29 – 2022-09-30 (×2): 180 mg via ORAL
  Filled 2022-09-29 (×2): qty 1

## 2022-09-29 MED ORDER — NICOTINE POLACRILEX 2 MG MT GUM
2.0000 mg | CHEWING_GUM | OROMUCOSAL | Status: DC
  Administered 2022-09-29 – 2022-09-30 (×3): 2 mg via ORAL
  Filled 2022-09-29 (×3): qty 1

## 2022-09-29 MED ORDER — CHLORHEXIDINE GLUCONATE CLOTH 2 % EX PADS
6.0000 | MEDICATED_PAD | Freq: Every day | CUTANEOUS | Status: DC
  Administered 2022-09-30: 6 via TOPICAL

## 2022-09-29 MED ORDER — GABAPENTIN 300 MG PO CAPS
300.0000 mg | ORAL_CAPSULE | Freq: Two times a day (BID) | ORAL | Status: DC
  Administered 2022-09-29 – 2022-09-30 (×2): 300 mg via ORAL
  Filled 2022-09-29 (×2): qty 1

## 2022-09-29 MED ORDER — BUDESON-GLYCOPYRROL-FORMOTEROL 160-9-4.8 MCG/ACT IN AERO: 2.0000 | INHALATION_SPRAY | Freq: Two times a day (BID) | RESPIRATORY_TRACT | Status: DC

## 2022-09-29 MED ORDER — BUPROPION HCL ER (XL) 150 MG PO TB24
300.0000 mg | ORAL_TABLET | Freq: Every day | ORAL | Status: DC
  Administered 2022-09-29 – 2022-09-30 (×2): 300 mg via ORAL
  Filled 2022-09-29 (×2): qty 2

## 2022-09-29 MED ORDER — PREDNISONE 50 MG PO TABS
50.0000 mg | ORAL_TABLET | Freq: Every day | ORAL | Status: DC
  Administered 2022-09-30: 50 mg via ORAL
  Filled 2022-09-29: qty 1

## 2022-09-29 MED ORDER — UMECLIDINIUM BROMIDE 62.5 MCG/ACT IN AEPB
1.0000 | INHALATION_SPRAY | Freq: Every day | RESPIRATORY_TRACT | Status: DC
  Administered 2022-09-30: 1 via RESPIRATORY_TRACT
  Filled 2022-09-29: qty 7

## 2022-09-29 MED ORDER — ENOXAPARIN SODIUM 40 MG/0.4ML IJ SOSY
40.0000 mg | PREFILLED_SYRINGE | INTRAMUSCULAR | Status: DC
  Administered 2022-09-29 – 2022-09-30 (×2): 40 mg via SUBCUTANEOUS
  Filled 2022-09-29 (×2): qty 0.4

## 2022-09-29 MED ORDER — DOCUSATE SODIUM 100 MG PO CAPS
100.0000 mg | ORAL_CAPSULE | Freq: Two times a day (BID) | ORAL | Status: DC
  Administered 2022-09-29 – 2022-09-30 (×2): 100 mg via ORAL
  Filled 2022-09-29 (×2): qty 1

## 2022-09-29 MED ORDER — LEVOTHYROXINE SODIUM 50 MCG PO TABS
75.0000 ug | ORAL_TABLET | Freq: Every day | ORAL | Status: DC
  Administered 2022-09-29 – 2022-09-30 (×2): 75 ug via ORAL
  Filled 2022-09-29 (×2): qty 1

## 2022-09-29 MED ORDER — SERTRALINE HCL 50 MG PO TABS
200.0000 mg | ORAL_TABLET | Freq: Every morning | ORAL | Status: DC
  Administered 2022-09-30: 200 mg via ORAL
  Filled 2022-09-29: qty 4

## 2022-09-29 MED ORDER — SODIUM CHLORIDE 0.9% FLUSH
10.0000 mL | Freq: Two times a day (BID) | INTRAVENOUS | Status: DC
  Administered 2022-09-30: 10 mL
  Administered 2022-09-30: 20 mL

## 2022-09-29 MED ORDER — MOMETASONE FURO-FORMOTEROL FUM 100-5 MCG/ACT IN AERO
2.0000 | INHALATION_SPRAY | Freq: Two times a day (BID) | RESPIRATORY_TRACT | Status: DC
  Administered 2022-09-29 – 2022-09-30 (×2): 2 via RESPIRATORY_TRACT
  Filled 2022-09-29: qty 8.8

## 2022-09-29 MED ORDER — ENOXAPARIN SODIUM 40 MG/0.4ML IJ SOSY: 40.0000 mg | PREFILLED_SYRINGE | INTRAMUSCULAR | Status: DC

## 2022-09-29 MED ORDER — ROSUVASTATIN CALCIUM 20 MG PO TABS
40.0000 mg | ORAL_TABLET | Freq: Every morning | ORAL | Status: DC
  Administered 2022-09-29 – 2022-09-30 (×2): 40 mg via ORAL
  Filled 2022-09-29 (×2): qty 2

## 2022-09-29 MED ORDER — LORATADINE 10 MG PO TABS
10.0000 mg | ORAL_TABLET | Freq: Every morning | ORAL | Status: DC
  Administered 2022-09-30: 10 mg via ORAL
  Filled 2022-09-29: qty 1

## 2022-09-29 MED ORDER — LOSARTAN POTASSIUM 25 MG PO TABS
25.0000 mg | ORAL_TABLET | Freq: Every day | ORAL | Status: DC
  Administered 2022-09-29 (×2): 25 mg via ORAL
  Filled 2022-09-29 (×2): qty 1

## 2022-09-29 MED ORDER — DAPAGLIFLOZIN PROPANEDIOL 10 MG PO TABS
10.0000 mg | ORAL_TABLET | Freq: Every day | ORAL | Status: DC
  Administered 2022-09-30: 10 mg via ORAL
  Filled 2022-09-29: qty 1

## 2022-09-29 MED ORDER — HYDROXYZINE HCL 50 MG PO TABS
50.0000 mg | ORAL_TABLET | Freq: Three times a day (TID) | ORAL | Status: DC | PRN
  Administered 2022-09-29: 50 mg via ORAL
  Filled 2022-09-29: qty 1

## 2022-09-29 MED ORDER — SODIUM CHLORIDE 0.9 % IV SOLN: INTRAVENOUS | Status: DC

## 2022-09-29 MED ORDER — SODIUM CHLORIDE 0.9% FLUSH: 10.0000 mL | INTRAVENOUS | Status: DC | PRN

## 2022-09-29 MED ORDER — INSULIN ASPART 100 UNIT/ML IJ SOLN
0.0000 [IU] | Freq: Every day | INTRAMUSCULAR | Status: DC
  Administered 2022-09-29: 3 [IU] via SUBCUTANEOUS
  Filled 2022-09-29: qty 1

## 2022-09-29 MED ORDER — NICOTINE 21 MG/24HR TD PT24
21.0000 mg | MEDICATED_PATCH | Freq: Every day | TRANSDERMAL | Status: DC
  Administered 2022-09-29 – 2022-09-30 (×2): 21 mg via TRANSDERMAL
  Filled 2022-09-29 (×2): qty 1

## 2022-09-29 MED ORDER — FUROSEMIDE 10 MG/ML IJ SOLN
40.0000 mg | Freq: Once | INTRAMUSCULAR | Status: AC
  Administered 2022-09-29: 40 mg via INTRAVENOUS
  Filled 2022-09-29: qty 4

## 2022-09-29 MED ORDER — INSULIN GLARGINE-YFGN 100 UNIT/ML ~~LOC~~ SOLN
5.0000 [IU] | Freq: Two times a day (BID) | SUBCUTANEOUS | Status: DC
  Administered 2022-09-29 – 2022-09-30 (×2): 5 [IU] via SUBCUTANEOUS
  Filled 2022-09-29 (×3): qty 0.05

## 2022-09-29 MED ORDER — INSULIN ASPART 100 UNIT/ML IJ SOLN
0.0000 [IU] | Freq: Three times a day (TID) | INTRAMUSCULAR | Status: DC
  Administered 2022-09-29: 8 [IU] via SUBCUTANEOUS
  Administered 2022-09-30: 3 [IU] via SUBCUTANEOUS
  Filled 2022-09-29 (×2): qty 1

## 2022-09-29 MED ORDER — ALBUTEROL SULFATE (2.5 MG/3ML) 0.083% IN NEBU: 2.5000 mg | INHALATION_SOLUTION | Freq: Four times a day (QID) | RESPIRATORY_TRACT | Status: DC | PRN

## 2022-09-29 MED ORDER — PANTOPRAZOLE SODIUM 40 MG PO TBEC
40.0000 mg | DELAYED_RELEASE_TABLET | Freq: Every day | ORAL | Status: DC
  Administered 2022-09-29 – 2022-09-30 (×2): 40 mg via ORAL
  Filled 2022-09-29 (×2): qty 1

## 2022-09-29 NOTE — Assessment & Plan Note (Signed)
Patient recently completed the course of antibiotics for pneumonia. Bilateral infiltrate can be due to either viral pneumonia/interstitial edema versus disease progression.  Procalcitonin negative.  MRSA PCR negative.  Blood cultures remain negative. -Stop vancomycin -Stop IV fluid -Give her 1 dose of IV Lasix -Continue to monitor -We will continue cefepime at this time due to her underlying comorbidities and risk.

## 2022-09-29 NOTE — Hospital Course (Addendum)
Taken from H&P.  April Luna is a 51 y.o. female with medical history significant of recent admission with community-acquired pneumonia, COPD, tobacco abuse, bipolar disorder, hypothyroidism, essential hypertension, colorectal cancer on chemotherapy who was discharged not like to go and now back with complaint of worsening shortness of breath and cough.  Patient was discharged on prednisone dose taper nicotine patch and completing oral antibiotics.  She returned to the ER today with hypoxia and near syncope.  She is weak.  Patient is due for next round of chemotherapy and radiation.  She was feeling better after her discharge from the hospital 4 days ago and now the symptoms.   ED course and labs reviewed.  She was noted to be hypoxic at 87% with talking or minor exertion requiring up to 2 L of oxygen, mildly tachycardic BUN 25 creatinine 1.09, white count 14.0, platelets of 138, COVID-19 and flu screen negative, urinalysis negative, CT angiogram of the chest showed no PE but new groundglass infiltrates anteriorly in bilateral upper lobes possible atypical edema infectious or inflammatory process.  10/6: Hemodynamically stable.  Remained afebrile.  Saturating in mid to high 90s on 2 L of oxygen.  Preliminary blood cultures negative in 12 hours. CBC with stable leukocytosis at 13.7 and platelet at 123. Check MRSA PCR-negative, discontinuing vancomycin Check procalcitonin-negative Check respiratory viral panel-pending. Patient continued to desaturate in mid 80s on room air, can be due to recent pneumonia versus disease progression with her history of colorectal cancer.  Also giving 1 time dose of IV Lasix.  10/7: Procalcitonin and respiratory viral panel negative. No need for continuation of antibiotics.  Patient need to have a close follow-up with her pulmonologist and oncologist. She is being discharged on 2 L of oxygen. She was counseled again to keep quitting smoking as she quit 1 week ago.   She will get benefit from repeating CT chest in 4 to 6 weeks to see the resolution of her findings, if persist or getting worse she will need a biopsy to rule out heart disease progression.  Patient will continue with her current medications and need to have a close follow-up with her providers for further management.

## 2022-09-29 NOTE — Assessment & Plan Note (Signed)
Continued to desaturate to mid 80s on room air requiring up to 2 L of oxygen.  No baseline oxygen use.  History of colorectal cancer and COPD.  Recent hospitalization for pneumonia. Differential at this time is viral pneumonia versus interstitial edema versus disease progression with history of colorectal cancer. -Continue with supplemental oxygen -Might becoming oxygen dependent -Ambulate with pulse ox

## 2022-09-29 NOTE — Plan of Care (Signed)
  Problem: Health Behavior/Discharge Planning: Goal: Ability to manage health-related needs will improve Outcome: Progressing   

## 2022-09-29 NOTE — Assessment & Plan Note (Signed)
-   CPAP at night °

## 2022-09-29 NOTE — Assessment & Plan Note (Signed)
Being managed as an outpatient, on chemotherapy.  Next cycle is on 10/17 per patient.

## 2022-09-29 NOTE — Assessment & Plan Note (Signed)
No wheezing today. -Switch Solu-Medrol with prednisone -Continue with bronchodilators -Continue with supportive care -Might need oxygen for home

## 2022-09-29 NOTE — Assessment & Plan Note (Signed)
Seems stable. -Current new to monitor

## 2022-09-29 NOTE — Assessment & Plan Note (Signed)
-   Continue home Synthroid °

## 2022-09-29 NOTE — Assessment & Plan Note (Signed)
Patient was on Farxiga at home which we will continue.  CBG elevated and she is on steroid. -Add SSI -Add Semglee 5 units twice daily. -Continue to monitor

## 2022-09-29 NOTE — Assessment & Plan Note (Signed)
Blood pressure within goal. -Continue home meds

## 2022-09-29 NOTE — Assessment & Plan Note (Signed)
Continue PPI ?

## 2022-09-29 NOTE — Progress Notes (Signed)
Progress Note   Patient: April Luna EGB:[id number] DOB: 1971/04/08 DOA: 09/28/2022     1 DOS: the patient was seen and examined on 09/29/2022   Brief hospital course: Taken from H&P.  April Luna is a 51 y.o. female with medical history significant of recent admission with community-acquired pneumonia, COPD, tobacco abuse, bipolar disorder, hypothyroidism, essential hypertension, colorectal cancer on chemotherapy who was discharged not like to go and now back with complaint of worsening shortness of breath and cough.  Patient was discharged on prednisone dose taper nicotine patch and completing oral antibiotics.  She returned to the ER today with hypoxia and near syncope.  She is weak.  Patient is due for next round of chemotherapy and radiation.  She was feeling better after her discharge from the hospital 4 days ago and now the symptoms.   ED course and labs reviewed.  She was noted to be hypoxic at 87% with talking or minor exertion requiring up to 2 L of oxygen, mildly tachycardic BUN 25 creatinine 1.09, white count 14.0, platelets of 138, COVID-19 and flu screen negative, urinalysis negative, CT angiogram of the chest showed no PE but new groundglass infiltrates anteriorly in bilateral upper lobes possible atypical edema infectious or inflammatory process.  10/6: Hemodynamically stable.  Remained afebrile.  Saturating in mid to high 90s on 2 L of oxygen.  Preliminary blood cultures negative in 12 hours. CBC with stable leukocytosis at 13.7 and platelet at 123. Check MRSA PCR-we will discontinue vancomycin if negative. Check procalcitonin. Check respiratory viral panel.     Assessment and Plan: * Acute respiratory failure with hypoxia (HCC) Continued to desaturate to mid 80s on room air requiring up to 2 L of oxygen.  No baseline oxygen use.  History of colorectal cancer and COPD.  Recent hospitalization for pneumonia. Differential at this time is viral pneumonia versus  interstitial edema versus disease progression with history of colorectal cancer. -Continue with supplemental oxygen -Might becoming oxygen dependent -Ambulate with pulse ox  COPD with acute exacerbation (HCC) No wheezing today. -Switch Solu-Medrol with prednisone -Continue with bronchodilators -Continue with supportive care -Might need oxygen for home  Lobar pneumonia [facility]) Patient recently completed the course of antibiotics for pneumonia. Bilateral infiltrate can be due to either viral pneumonia/interstitial edema versus disease progression.  Procalcitonin negative.  MRSA PCR negative.  Blood cultures remain negative. -Stop vancomycin -Stop IV fluid -Give her 1 dose of IV Lasix -Continue to monitor -We will continue cefepime at this time due to her underlying comorbidities and risk.  Hypothyroidism - Continue home Synthroid  Rectal cancer [facility]) Being managed as an outpatient, on chemotherapy.  Next cycle is on 10/17 per patient.  Essential hypertension Blood pressure within goal. -Continue home meds  Uncontrolled type 2 diabetes mellitus with hyperglycemia, without long-term current use of insulin ([name]) Patient was on Farxiga at home which we will continue.  CBG elevated and she is on steroid. -Add SSI -Add Semglee 5 units twice daily. -Continue to monitor  GERD without esophagitis - Continue PPI  Obstructive sleep apnea syndrome - CPAP at night  Stage 3a chronic kidney disease (HCC) Seems stable. -Current new to monitor   Subjective: Patient was feeling some improvement in her dyspnea with oxygen. She quit smoking 1 week ago.  Physical Exam: Vitals:   09/29/22 0800 09/29/22 1010 09/29/22 1200 09/29/22 1404  BP: 104/78 101/65 (!) 92/59 92/63  Pulse: (!) 107 85 94 85  Resp: '19 17 18 16  '$ Temp: 98  F (36.7 C)  98.3 F (36.8 C)   TempSrc: Oral  Oral   SpO2: 95% 97% 93% 93%  Weight:      Height:       General.  Malnourished lady, in no acute  distress. Pulmonary.  Harsh breath sounds with some scattered rhonchi bilaterally, normal respiratory effort. CV.  Regular rate and rhythm, no JVD, rub or murmur. Abdomen.  Soft, nontender, nondistended, BS positive. CNS.  Alert and oriented .  No focal neurologic deficit. Extremities.  No edema, no cyanosis, pulses intact and symmetrical. Psychiatry.  Judgment and insight appears normal.  Data Reviewed: Prior data reviewed  Family Communication: Discussed with patient  Disposition: Status is: Inpatient Remains inpatient appropriate because: Severity of illness   Planned Discharge Destination: Home  DVT prophylaxis.  Lovenox Time spent: 50 minutes  This record has been created using Systems analyst. Errors have been sought and corrected,but may not always be located. Such creation errors do not reflect on the standard of care.  Author: Lorella Nimrod, MD 09/29/2022 3:16 PM  For on call review [email or web address].

## 2022-09-29 NOTE — ED Notes (Signed)
Informed RN bed assigned 

## 2022-09-29 NOTE — ED Notes (Signed)
Ambulation with no oxygen 86-88%

## 2022-09-30 DIAGNOSIS — J9601 Acute respiratory failure with hypoxia: Secondary | ICD-10-CM | POA: Diagnosis not present

## 2022-09-30 LAB — PROCALCITONIN: Procalcitonin: <0.1 ng/mL

## 2022-09-30 LAB — GLUCOSE, CAPILLARY: Glucose-Capillary: 170 mg/dL — ABNORMAL HIGH (ref 70–99)

## 2022-09-30 MED ORDER — PREDNISONE 50 MG PO TABS: ORAL_TABLET | ORAL | 0 refills | Status: DC

## 2022-09-30 MED ORDER — HEPARIN SOD (PORK) LOCK FLUSH 100 UNIT/ML IV SOLN
500.0000 [IU] | Freq: Once | INTRAVENOUS | Status: AC
  Administered 2022-09-30: 500 [IU] via INTRAVENOUS
  Filled 2022-09-30: qty 5

## 2022-09-30 NOTE — Progress Notes (Signed)
Cpap refused 

## 2022-09-30 NOTE — TOC Transition Note (Signed)
Transition of Care [facility]) - CM/SW Discharge Note   Patient Details  Name: April Luna MRN: [id number] Date of Birth: 19-Mar-1971  Transition of Care [facility]) CM/SW Contact:  Izola Price, RN Phone Number: 09/30/2022, 12:27 PM   Clinical Narrative: 10/7: Patient discharging today. Ambulation saturation test does not qualify for DME home oxygen. Nebulizer ordered and delivered on 09/24/22 via [employer] for home use.  To follow up with pulmonologist and has PCP follow up scheduled for 10/12 if not sooner. Follow up on 10/09/22 with Dr. Konrad Felix RN CM    Final next level of care: Home/Self Care Barriers to Discharge: Barriers Resolved   Patient Goals and CMS Choice        Discharge Placement                       Discharge Plan and Services                          HH Arranged: NA [place] Agency: NA        Social Determinants of Health ([place]) Interventions     Readmission Risk Interventions    04/24/2022   10:32 AM  Readmission Risk Prevention Plan  Transportation Screening Complete  PCP or Specialist Appt within 5-7 Days Complete  Home Care Screening Complete  Medication Review (RN CM) Complete

## 2022-09-30 NOTE — Discharge Summary (Signed)
Physician Discharge Summary   Patient: April Luna MRN: [id number] DOB: 10-May-1971  Admit date:     09/28/2022  Discharge date: 09/30/22  Discharge Physician: Lorella Nimrod   PCP: Steele Sizer, MD   Recommendations at discharge:  Please obtain CBC and BMP in 1 week Follow-up with primary care provider within a week Follow-up with pulmonology in 1 to 2 weeks Please repeat CT chest in 4 to 6 weeks to see the resolution of findings.  If persist then she might need a biopsy to rule out her disease progression. Follow-up with oncology according to her appointment to continue chemotherapy.  Discharge Diagnoses: Principal Problem:   Acute respiratory failure with hypoxia (HCC) Active Problems:   COPD with acute exacerbation (HCC)   Lobar pneumonia (HCC)   Hypothyroidism   Rectal cancer ([place])   Essential hypertension   Uncontrolled type 2 diabetes mellitus with hyperglycemia, without long-term current use of insulin (HCC)   GERD without esophagitis   Obstructive sleep apnea syndrome   Stage 3a chronic kidney disease [facility])   Hospital Course: Taken from H&P.  April Luna is a 51 y.o. female with medical history significant of recent admission with community-acquired pneumonia, COPD, tobacco abuse, bipolar disorder, hypothyroidism, essential hypertension, colorectal cancer on chemotherapy who was discharged not like to go and now back with complaint of worsening shortness of breath and cough.  Patient was discharged on prednisone dose taper nicotine patch and completing oral antibiotics.  She returned to the ER today with hypoxia and near syncope.  She is weak.  Patient is due for next round of chemotherapy and radiation.  She was feeling better after her discharge from the hospital 4 days ago and now the symptoms.   ED course and labs reviewed.  She was noted to be hypoxic at 87% with talking or minor exertion requiring up to 2 L of oxygen, mildly tachycardic BUN 25  creatinine 1.09, white count 14.0, platelets of 138, COVID-19 and flu screen negative, urinalysis negative, CT angiogram of the chest showed no PE but new groundglass infiltrates anteriorly in bilateral upper lobes possible atypical edema infectious or inflammatory process.  10/6: Hemodynamically stable.  Remained afebrile.  Saturating in mid to high 90s on 2 L of oxygen.  Preliminary blood cultures negative in 12 hours. CBC with stable leukocytosis at 13.7 and platelet at 123. Check MRSA PCR-negative, discontinuing vancomycin Check procalcitonin-negative Check respiratory viral panel-pending. Patient continued to desaturate in mid 80s on room air, can be due to recent pneumonia versus disease progression with her history of colorectal cancer.  Also giving 1 time dose of IV Lasix.  10/7: Procalcitonin and respiratory viral panel negative. No need for continuation of antibiotics.  Patient need to have a close follow-up with her pulmonologist and oncologist. She is being discharged on 2 L of oxygen. She was counseled again to keep quitting smoking as she quit 1 week ago.  She will get benefit from repeating CT chest in 4 to 6 weeks to see the resolution of her findings, if persist or getting worse she will need a biopsy to rule out heart disease progression.  Patient will continue with her current medications and need to have a close follow-up with her providers for further management.  Assessment and Plan: * Acute respiratory failure with hypoxia (HCC) Continued to desaturate to mid 80s on room air requiring up to 2 L of oxygen.  No baseline oxygen use.  History of colorectal cancer and COPD.  Recent hospitalization  for pneumonia. Differential at this time is viral pneumonia versus interstitial edema versus disease progression with history of colorectal cancer. -Continue with supplemental oxygen -Might becoming oxygen dependent -Ambulate with pulse ox  COPD with acute exacerbation (HCC) No  wheezing today. -Switch Solu-Medrol with prednisone -Continue with bronchodilators -Continue with supportive care -Might need oxygen for home  Lobar pneumonia [facility]) Patient recently completed the course of antibiotics for pneumonia. Bilateral infiltrate can be due to either viral pneumonia/interstitial edema versus disease progression.  Procalcitonin negative.  MRSA PCR negative.  Blood cultures remain negative. -Stop vancomycin -Stop IV fluid -Give her 1 dose of IV Lasix -Continue to monitor -We will continue cefepime at this time due to her underlying comorbidities and risk.  Hypothyroidism - Continue home Synthroid  Rectal cancer [facility]) Being managed as an outpatient, on chemotherapy.  Next cycle is on 10/17 per patient.  Essential hypertension Blood pressure within goal. -Continue home meds  Uncontrolled type 2 diabetes mellitus with hyperglycemia, without long-term current use of insulin ([place]) Patient was on Farxiga at home which we will continue.  CBG elevated and she is on steroid. -Add SSI -Add Semglee 5 units twice daily. -Continue to monitor  GERD without esophagitis - Continue PPI  Obstructive sleep apnea syndrome - CPAP at night  Stage 3a chronic kidney disease (HCC) Seems stable. -Current new to monitor   Consultants: None Procedures performed: None Disposition: Home Diet recommendation:  Discharge Diet Orders (From admission, onward)     Start     Ordered   09/30/22 0000  Diet - low sodium heart healthy        09/30/22 1059           Cardiac diet DISCHARGE MEDICATION: Allergies as of 09/30/2022       Reactions   Metformin And Related Nausea And Vomiting   Nsaids Other (See Comments)   Stage 3 CKD   Perphenazine Other (See Comments)   Tremors, muscle weakness, tongue swelling   Sulfa Antibiotics Other (See Comments)   GI distress   Abilify [aripiprazole] Rash   Penicillins Rash   She has taken amoxicillin without problems         Medication List     STOP taking these medications    KETOCONAZOLE (TOPICAL) 1 % Sham       TAKE these medications    acetaminophen 325 MG tablet Commonly known as: TYLENOL Take 650 mg by mouth every 6 (six) hours as needed for headache (pain).   AeroChamber MV inhaler Use as instructed   albuterol 108 (90 Base) MCG/ACT inhaler Commonly known as: Ventolin HFA Inhale 2 puffs into the lungs every 6 (six) hours as needed for wheezing or shortness of breath.   albuterol (2.5 MG/3ML) 0.083% nebulizer solution Commonly known as: PROVENTIL Take 3 mLs (2.5 mg total) by nebulization every 6 (six) hours as needed for shortness of breath or wheezing.   amantadine 100 MG capsule Commonly known as: SYMMETREL Take 100 mg by mouth 2 (two) times daily.   blood glucose meter kit and supplies Dispense based on patient and insurance preference. Use up to four times daily as directed. (FOR ICD-10 E10.9, E11.9).   Breztri Aerosphere 160-9-4.8 MCG/ACT Aero Generic drug: Budeson-Glycopyrrol-Formoterol Inhale 2 puffs into the lungs in the morning and at bedtime.   buPROPion 300 MG 24 hr tablet Commonly known as: WELLBUTRIN XL Take 300 mg by mouth daily.   CHEWABLES MULTIVITAMIN PO Take 1 tablet by mouth daily.   diltiazem 180 MG  24 hr capsule Commonly known as: CARDIZEM CD Take 1 capsule (180 mg total) by mouth daily.   docusate sodium 100 MG capsule Commonly known as: Colace Take 1 capsule (100 mg total) by mouth 2 (two) times daily.   Farxiga 10 MG Tabs tablet Generic drug: dapagliflozin propanediol TAKE 1 TABLET BY MOUTH DAILY BEFORE BREAKFAST.   gabapentin 300 MG capsule Commonly known as: NEURONTIN Take 300 mg by mouth 2 (two) times daily.   Hydrocortisone (Perianal) 1 % Crea Apply topically.   hydrocortisone cream 0.5 % Apply 1 Application topically 2 (two) times daily as needed for itching.   hydrOXYzine 50 MG capsule Commonly known as: VISTARIL Take 50 mg by  mouth 3 (three) times daily as needed for anxiety.   icosapent Ethyl 1 g capsule Commonly known as: VASCEPA TAKE 2 CAPSULES BY MOUTH 2 TIMES DAILY   Invega Sustenna 234 MG/1.5ML injection Generic drug: paliperidone Inject 234 mg into the muscle every 30 (thirty) days. On or about the 14th of each month   levothyroxine 75 MCG tablet Commonly known as: SYNTHROID TAKE 1 TABLET BY MOUTH DAILY BEFORE BREAKFAST   loratadine 10 MG tablet Commonly known as: CLARITIN Take 10 mg by mouth every morning.   losartan 25 MG tablet Commonly known as: COZAAR Take 1 tablet (25 mg total) by mouth at bedtime.   nicotine 21 mg/24hr patch Commonly known as: NICODERM CQ - dosed in mg/24 hours Aplly 44m patch chest wall daily (okay to substitute generic)   nicotine polacrilex 2 MG gum Commonly known as: NICORETTE Take 1 each (2 mg total) by mouth every 4 (four) hours while awake.   NON FORMULARY Pt uses a cpap nightly   pantoprazole 40 MG tablet Commonly known as: PROTONIX TAKE 1 TABLET BY MOUTH DAILY   predniSONE 50 MG tablet Commonly known as: DELTASONE 1 tablet daily for next 3 days What changed:  medication strength additional instructions   rosuvastatin 40 MG tablet Commonly known as: CRESTOR Take 1 tablet (40 mg total) by mouth every morning.   sertraline 100 MG tablet Commonly known as: ZOLOFT Take 200 mg by mouth every morning.   Vitamin D-3 125 MCG (5000 UT) Tabs Take 5,000 Units by mouth daily.               Durable Medical Equipment  (From admission, onward)           Start     Ordered   09/29/22 1457  For home use only DME oxygen  Once       Question Answer Comment  Length of Need Lifetime   Mode or (Route) Nasal cannula   Liters per Minute 2   Frequency Continuous (stationary and portable oxygen unit needed)   Oxygen conserving device Yes   Oxygen delivery system Gas      09/29/22 1456            Follow-up Information     SSteele Sizer  MD. Schedule an appointment as soon as possible for a visit in 1 week(s).   Specialty: Family Medicine Contact information: [address] [id number]               Discharge Exam: FDanley DankerWeights   09/28/22 1709  Weight: 59 kg   General.     In no acute distress. Pulmonary.  Scattered dry crackles bilaterally, normal respiratory effort. CV.  Regular rate and rhythm, no JVD, rub or murmur. Abdomen.  Soft,  nontender, nondistended, BS positive. CNS.  Alert and oriented .  No focal neurologic deficit. Extremities.  No edema, no cyanosis, pulses intact and symmetrical. Psychiatry.  Judgment and insight appears normal.   Condition at discharge: stable  The results of significant diagnostics from this hospitalization (including imaging, microbiology, ancillary and laboratory) are listed below for reference.   Imaging Studies: CT Angio Chest PE W and/or Wo Contrast  Result Date: 09/28/2022 CLINICAL DATA:  Weakness, history of rectal carcinoma EXAM: CT ANGIOGRAPHY CHEST WITH CONTRAST TECHNIQUE: Multidetector CT imaging of the chest was performed using the standard protocol during bolus administration of intravenous contrast. Multiplanar CT image reconstructions and MIPs were obtained to evaluate the vascular anatomy. RADIATION DOSE REDUCTION: This exam was performed according to the departmental dose-optimization program which includes automated exposure control, adjustment of the mA and/or kV according to patient size and/or use of iterative reconstruction technique. CONTRAST:  1m OMNIPAQUE IOHEXOL 350 MG/ML SOLN COMPARISON:  09/20/2022 FINDINGS: Cardiovascular: Right IJ port catheter to the cavoatrial junction. Heart size normal. Trace pericardial fluid. The RV is nondilated. Satisfactory opacification of pulmonary arteries noted, and there is no evidence of pulmonary emboli. Adequate contrast opacification of the thoracic aorta with no evidence of  dissection, aneurysm, or stenosis. There is classic 3-vessel brachiocephalic arch anatomy without proximal stenosis. Minimal calcified atheromatous plaque in the arch and descending thoracic aorta. Mediastinum/Nodes: No mass or adenopathy. Lungs/Pleura: No pleural effusion. No pneumothorax. Scattered ground-glass infiltrate in the anterior segments of both upper lobes, new since previous. Upper Abdomen: No acute findings. Musculoskeletal: No chest wall abnormality. No acute or significant osseous findings. Review of the MIP images confirms the above findings. IMPRESSION: 1. Negative for acute PE or thoracic aortic dissection. 2. New ground-glass infiltrates anteriorly in bilateral upper lobes, may represent atypical edema, infectious or inflammatory process. 3.  Aortic Atherosclerosis (ICD10-170.0). Electronically Signed   By: DLucrezia EuropeM.D.   On: 09/28/2022 20:55   CT Head Wo Contrast  Result Date: 09/28/2022 CLINICAL DATA:  Worsening headaches and syncopal episode, initial encounter EXAM: CT HEAD WITHOUT CONTRAST TECHNIQUE: Contiguous axial images were obtained from the base of the skull through the vertex without intravenous contrast. RADIATION DOSE REDUCTION: This exam was performed according to the departmental dose-optimization program which includes automated exposure control, adjustment of the mA and/or kV according to patient size and/or use of iterative reconstruction technique. COMPARISON:  02/02/2017 FINDINGS: Brain: No evidence of acute infarction, hemorrhage, hydrocephalus, extra-axial collection or mass lesion/mass effect. Vascular: No hyperdense vessel or unexpected calcification. Skull: Normal. Negative for fracture or focal lesion. Sinuses/Orbits: No acute finding. Other: None. IMPRESSION: No acute intracranial abnormality noted. Electronically Signed   By: MInez CatalinaM.D.   On: 09/28/2022 19:30   DG Chest 2 View  Result Date: 09/28/2022 CLINICAL DATA:  Weakness EXAM: CHEST - 2 VIEW  COMPARISON:  Chest x-ray 06/14/2018 FINDINGS: Right chest port catheter tip projects over the SVC, unchanged. The heart size and mediastinal contours are within normal limits. Both lungs are clear. The visualized skeletal structures are unremarkable. IMPRESSION: No active cardiopulmonary disease. Electronically Signed   By: ARonney AstersM.D.   On: 09/28/2022 18:12   CT Angio Chest PE W and/or Wo Contrast  Result Date: 09/20/2022 CLINICAL DATA:  Shortness of breath, hypoxia EXAM: CT ANGIOGRAPHY CHEST WITH CONTRAST TECHNIQUE: Multidetector CT imaging of the chest was performed using the standard protocol during bolus administration of intravenous contrast. Multiplanar CT image reconstructions and MIPs were obtained to evaluate the  vascular anatomy. RADIATION DOSE REDUCTION: This exam was performed according to the departmental dose-optimization program which includes automated exposure control, adjustment of the mA and/or kV according to patient size and/or use of iterative reconstruction technique. CONTRAST:  51m OMNIPAQUE IOHEXOL 350 MG/ML SOLN COMPARISON:  Previous CT done on 03/02/2022 and chest radiograph done today FINDINGS: Cardiovascular: There is homogeneous enhancement in thoracic aorta. There are scattered calcifications and atherosclerotic plaques in thoracic aorta. There are no intraluminal filling defects in pulmonary artery branches. Mediastinum/Nodes: No significant lymphadenopathy seen. Lungs/Pleura: Small linear patchy density is seen in the anteromedial aspect of right mid lung field suggesting atelectasis/pneumonia in right middle lobe. There are faint ground-glass densities in posterior right lower lung field. There is no pleural effusion or pneumothorax. Upper Abdomen: No acute findings are seen. Musculoskeletal: Unremarkable. Review of the MIP images confirms the above findings. IMPRESSION: There is no evidence of pulmonary artery embolism. There is no evidence of thoracic aortic  dissection. Small linear patchy alveolar infiltrate is seen in medial segment of right middle lobe suggesting atelectasis/pneumonia. Follow-up studies should be considered to assess resolution. Electronically Signed   By: PElmer PickerM.D.   On: 09/20/2022 11:47   DG Chest 2 View  Result Date: 09/20/2022 CLINICAL DATA:  Shortness of breath. EXAM: CHEST - 2 VIEW COMPARISON:  July 29, 2021. FINDINGS: The heart size and mediastinal contours are within normal limits. Both lungs are clear. Right internal jugular Port-A-Cath is noted with distal tip in expected position of the SVC. The visualized skeletal structures are unremarkable. IMPRESSION: No active cardiopulmonary disease. Electronically Signed   By: JMarijo ConceptionM.D.   On: 09/20/2022 10:21    Microbiology: Results for orders placed or performed during the hospital encounter of 09/28/22  Culture, blood (routine x 2)     Status: None (Preliminary result)   Collection Time: 09/28/22  8:01 PM   Specimen: BLOOD  Result Value Ref Range Status   Specimen Description BLOOD RIGHT ARM  Final   Special Requests   Final    BOTTLES DRAWN AEROBIC AND ANAEROBIC Blood Culture results may not be optimal due to an inadequate volume of blood received in culture bottles   Culture   Final    NO GROWTH 2 DAYS Performed at [facility] [address] [place] [address]   Report Status PENDING  Incomplete  Culture, blood (routine x 2)     Status: None (Preliminary result)   Collection Time: 09/28/22  8:19 PM   Specimen: BLOOD  Result Value Ref Range Status   Specimen Description BLOOD BLOOD RIGHT ARM  Final   Special Requests   Final    BOTTLES DRAWN AEROBIC AND ANAEROBIC Blood Culture adequate volume   Culture   Final    NO GROWTH 2 DAYS [facility] [address], [place] [address]   Report Status PENDING  Incomplete  Resp Panel by RT-PCR (Flu A&B, Covid) Anterior Nasal Swab     Status:  None   Collection Time: 09/28/22  9:50 PM   Specimen: Anterior Nasal Swab  Result Value Ref Range Status   SARS Coronavirus 2 by RT PCR NEGATIVE NEGATIVE Final    Comment: (NOTE) SARS-CoV-2 target nucleic acids are NOT DETECTED.  The SARS-CoV-2 RNA is generally detectable in upper respiratory specimens during the acute phase of infection. The lowest concentration of SARS-CoV-2 viral copies this assay can detect is 138 copies/mL. A negative result does not  preclude SARS-Cov-2 infection and should not be used as the sole basis for treatment or other patient management decisions. A negative result may occur with  improper specimen collection/handling, submission of specimen other than nasopharyngeal swab, presence of viral mutation(s) within the areas targeted by this assay, and inadequate number of viral copies(<138 copies/mL). A negative result must be combined with clinical observations, patient history, and epidemiological information. The expected result is Negative.  Fact Sheet for Patients:  EntrepreneurPulse.com.au  Fact Sheet for Healthcare Providers:  IncredibleEmployment.be  This test is no t yet approved or cleared by the [place] FDA and  has been authorized for detection and/or diagnosis of SARS-CoV-2 by FDA under an Emergency Use Authorization (EUA). This EUA will remain  in effect (meaning this test can be used) for the duration of the COVID-19 declaration under Section 564(b)(1) of the Act, 21 U.S.C.section 360bbb-3(b)(1), unless the authorization is terminated  or revoked sooner.       Influenza A by PCR NEGATIVE NEGATIVE Final   Influenza B by PCR NEGATIVE NEGATIVE Final    Comment: (NOTE) The Xpert Xpress SARS-CoV-2/FLU/RSV plus assay is intended as an aid in the diagnosis of influenza from Nasopharyngeal swab specimens and should not be used as a sole basis for treatment. Nasal washings and aspirates are unacceptable  for Xpert Xpress SARS-CoV-2/FLU/RSV testing.  Fact Sheet for Patients: EntrepreneurPulse.com.au  Fact Sheet for Healthcare Providers: IncredibleEmployment.be  This test is not yet approved or cleared by the [place] FDA and has been authorized for detection and/or diagnosis of SARS-CoV-2 by FDA under an Emergency Use Authorization (EUA). This EUA will remain in effect (meaning this test can be used) for the duration of the COVID-19 declaration under Section 564(b)(1) of the Act, 21 U.S.C. section 360bbb-3(b)(1), unless the authorization is terminated or revoked.  Performed at [facility], [address] [place], [place] [address]   MRSA Next Gen by PCR, Nasal     Status: None   Collection Time: 09/29/22 10:07 AM   Specimen: Nasal Mucosa; Nasal Swab  Result Value Ref Range Status   MRSA by PCR Next Gen NOT DETECTED NOT DETECTED Final    Comment: (NOTE) The GeneXpert MRSA Assay (FDA approved for NASAL specimens only), is one component of a comprehensive MRSA colonization surveillance program. It is not intended to diagnose MRSA infection nor to guide or monitor treatment for MRSA infections. Test performance is not FDA approved in patients less than 83 years old. Performed at [facility], [place], [place] [address]   Respiratory (~20 pathogens) panel by PCR     Status: None   Collection Time: 09/29/22 10:07 AM   Specimen: Nasal Mucosa; Respiratory  Result Value Ref Range Status   Adenovirus NOT DETECTED NOT DETECTED Final   Coronavirus 229E NOT DETECTED NOT DETECTED Final    Comment: (NOTE) The Coronavirus on the Respiratory Panel, DOES NOT test for the novel  Coronavirus (2019 nCoV)    Coronavirus HKU1 NOT DETECTED NOT DETECTED Final   Coronavirus NL63 NOT DETECTED NOT DETECTED Final   Coronavirus OC43 NOT DETECTED NOT DETECTED Final   Metapneumovirus NOT DETECTED NOT DETECTED Final    Rhinovirus / Enterovirus NOT DETECTED NOT DETECTED Final   Influenza A NOT DETECTED NOT DETECTED Final   Influenza B NOT DETECTED NOT DETECTED Final   Parainfluenza Virus 1 NOT DETECTED NOT DETECTED Final   Parainfluenza Virus 2 NOT DETECTED NOT DETECTED Final   Parainfluenza Virus 3 NOT DETECTED  NOT DETECTED Final   Parainfluenza Virus 4 NOT DETECTED NOT DETECTED Final   Respiratory Syncytial Virus NOT DETECTED NOT DETECTED Final   Bordetella pertussis NOT DETECTED NOT DETECTED Final   Bordetella Parapertussis NOT DETECTED NOT DETECTED Final   Chlamydophila pneumoniae NOT DETECTED NOT DETECTED Final   Mycoplasma pneumoniae NOT DETECTED NOT DETECTED Final    Comment: Performed at [facility], [place] [address], [place], [place] [address]    Labs: CBC: Recent Labs  Lab 09/28/22 1716 09/29/22 0520  WBC 14.0* 13.7*  HGB 14.3 13.9  HCT 41.2 41.2  MCV 102.0* 104.0*  PLT 138* 446*   Basic Metabolic Panel: Recent Labs  Lab 09/28/22 1716 09/29/22 0520  NA 135  --   K 4.1  --   CL 100  --   CO2 25  --   GLUCOSE 238*  --   BUN 25*  --   CREATININE 1.09* 0.98  CALCIUM 9.6  --    Liver Function Tests: No results for input(s): "AST", "ALT", "ALKPHOS", "BILITOT", "PROT", "ALBUMIN" in the last 168 hours. CBG: Recent Labs  Lab 09/24/22 1219 09/28/22 1710 09/29/22 1603 09/29/22 2118 09/30/22 0818  GLUCAP 272* 225* 264* 271* 170*    Discharge time spent: greater than 30 minutes.  This record has been created using Systems analyst. Errors have been sought and corrected,but may not always be located. Such creation errors do not reflect on the standard of care.   Signed: Lorella Nimrod, MD [facility] 09/30/2022

## 2022-09-30 NOTE — Progress Notes (Signed)
SATURATION QUALIFICATIONS: (This note is used to comply with regulatory documentation for home oxygen)  Patient Saturations on Room Air at Rest = 98%  Patient Saturations on Room Air while Ambulating = 96%   

## 2022-10-02 ENCOUNTER — Ambulatory Visit: Payer: Medicare Other

## 2022-10-02 ENCOUNTER — Other Ambulatory Visit: Payer: Medicare Other

## 2022-10-02 ENCOUNTER — Ambulatory Visit: Payer: Medicare Other | Admitting: Oncology

## 2022-10-03 ENCOUNTER — Other Ambulatory Visit: Payer: Medicare Other

## 2022-10-03 ENCOUNTER — Ambulatory Visit: Payer: Medicare Other | Admitting: Oncology

## 2022-10-03 ENCOUNTER — Ambulatory Visit: Payer: Self-pay | Admitting: *Deleted

## 2022-10-03 ENCOUNTER — Ambulatory Visit: Payer: Medicare Other

## 2022-10-03 ENCOUNTER — Telehealth: Payer: Self-pay | Admitting: Family Medicine

## 2022-10-03 LAB — CULTURE, BLOOD (ROUTINE X 2)
Culture: NO GROWTH
Culture: NO GROWTH
Special Requests: ADEQUATE

## 2022-10-03 NOTE — Telephone Encounter (Signed)
Summary: blood sugar question   Patient called  in wants to know is she ok to wait till her appt on 10/12 because her sugar tested at 230, not in the danger zone and this is since she stop taking steroids. Please call back to advise        Chief Complaint: elevated blood glucose since starting on prednisone for pneumonia  Symptoms: blood glucose at 1200 was 208. C/o double vision at times not now. Feels "shaky".  Poor ambulation. N Recently discharged from hospital for pneumonia 09/30/22. Increased thirst, increased urination. Reports BP 110/74 this am. Prone to low Bp. No dizziness reported. Frequency: couple of days. Pertinent Negatives: Patient denies difficulty breathing no c/o chest pain no fever. Disposition: '[]'$ ED /'[]'$ Urgent Care (no appt availability in office) / '[x]'$ Appointment(In office/virtual)/ '[]'$  [facility]/ '[]'$ Home Care/ '[]'$ Refused Recommended Disposition /'[]'$ [place] [facility]/ '[x]'$  Follow-up with PCP Additional Notes:   Appt already scheduled for 10/05/22 for hospital f/u. Patient requesting if she should start taking prednisone prescribed from hospital visit due to elevated blood sugars. Patient requesting assistance for transportation to [place] on 10/16/22, she reports she does not have a ride. Please advise.        Reason for Disposition  [1] Caller has URGENT medication or insulin pump question AND [2] triager unable to answer question  Answer Assessment - Initial Assessment Questions 1. BLOOD GLUCOSE: "What is your blood glucose level?"      208 2. ONSET: "When did you check the blood glucose?"     1200 3. USUAL RANGE: "What is your glucose level usually?" (e.g., usual fasting morning value, usual evening value)     Fasting 158 4. KETONES: "Do you check for ketones (urine or blood test strips)?" If Yes, ask: "What does the test show now?"      na 5. TYPE 1 or 2:  "Do you know what type of diabetes you have?"  (e.g., Type 1, Type 2, Gestational; doesn't know)       Type 2  6. INSULIN: "Do you take insulin?" "What type of insulin(s) do you use? What is the mode of delivery? (syringe, pen; injection or pump)?"      Na  7. DIABETES PILLS: "Do you take any pills for your diabetes?" If Yes, ask: "Have you missed taking any pills recently?"     farixga 8. OTHER SYMPTOMS: "Do you have any symptoms?" (e.g., fever, frequent urination, difficulty breathing, dizziness, weakness, vomiting)     Double vision, at times not now.  feels shaking , increase urination increased hunger . Has been taking prednisone in hospital and prescribed to continue prednisone  9. PREGNANCY: "Is there any chance you are pregnant?" "When was your last menstrual period?"     na  Protocols used: Diabetes - High Blood Sugar-A-AH

## 2022-10-03 NOTE — Telephone Encounter (Signed)
Called patient and let her know. She gave verbal understanding.  

## 2022-10-03 NOTE — Telephone Encounter (Signed)
Received a call back from [facility] and was informed that they won't have an available driver for the requested time.  I spoke with patient and informed her that taxi service is unable and to see if we could r/s her.  Patient stated that she was going to try to see if her neighbor would be able to bring her and she would let me know on tomorrow, 10/04/22 so that we will know how to proceed.

## 2022-10-03 NOTE — Telephone Encounter (Signed)
Copied from [place] [phone]. Topic: Transportation - Transportation >> Oct 03, 2022  1:39 PM [place] B wrote: Reason for CRM: Patient called in needs transportation as a courtesy for her appt on 10/12. She says she doesn't qualify for transportation thru anywhere else.   Spoke with patient and went over the transportation screening questions under the SDOH tab.  Based on patient answers I completed the taxi service request and faxed to [place] [phone] on today and informed patient that once we receive a reply from the taxi company we would let her know.  Patient verbalized understanding.

## 2022-10-04 NOTE — Progress Notes (Signed)
Name: April Luna   MRN: [id number]    DOB: 1971/11/04   Date:10/05/2022       Progress Note  Subjective  [facility] Follow-Up  HPI  Admitted: 09/20/2022 and again 09/24/22 Discharged: 09/28/22 and again on 09/30/2022   Phone call made to the patient on 09/26/2022  First admission was due to hypoxemia ( noticed during her visit to our office the morning of 09/27), she was diagnosed with lobar pneumonia, and COPD with actue exacerbation. She was initially treated with Levaquin but switching to Rocephin and Azithromycin, upon discharge she was sent home on azithromycin, omnicef and prednisone She was feeling better but was sent home and advised to quit smoking . She was also sent home on Cardizem 180 mg daily due to tachycardia, Farxiga and advised to do sliding scale insulin at home due to prednisone ( however did not get rx) She was advised to stop losartan but she is still taking medication and bp is towards low end of normal today but normal at home. She was feeling better however on 10/05 she went to [facility] and while walking she felt dizzy and " weird" went back to [facility] and was hypoxic again, initially she was given vancomycin but stopped , since she had multiple tests done including negative pro-calcitonin and viral respiratory panel She was sent home. She was sent home on prednisone and oxygen . On discharge summary she was given rx of Semglee but she has not started it, her glucose has been between 150 and 250 , likely from steroids. She is not sure if she is taking farxiga and we will contact her pharmacy. We will try basal insulin and wrote how to titrate dose up and down. She has follow up with pulmonologist already scheduled, she was reminded that she will need repeat CT chest   Patient Active Problem List   Diagnosis Date Noted   Tachycardia 09/22/2022   Acute respiratory failure with hypoxia ([place]) 09/21/2022   Lobar pneumonia ([place]) 09/21/2022   COPD with acute  exacerbation ([place]) 09/20/2022   Atherosclerosis of aorta ([place]) 07/17/2022   Stage 3a chronic kidney disease ([place]) 07/17/2022   Thrombocytopenia ([place]) 06/24/2022   Tobacco use 05/26/2022   Encounter for antineoplastic chemotherapy 05/17/2022   Goals of care, counseling/discussion 05/03/2022   Rectal cancer ([place]) 04/21/2022   Genetic testing 03/27/2022   Family history of colon cancer 03/14/2022   Family history of uterine cancer 03/14/2022   Family history of breast cancer 03/14/2022   Polyp of ascending colon    Obstructive sleep apnea syndrome 08/11/2021   Plantar callus 10/06/2019   Acquired hallux limitus of both feet 10/06/2019   Uncontrolled type 2 diabetes mellitus with hyperglycemia, without long-term current use of insulin ([place]) 06/24/2019   Chronic obstructive pulmonary disease ([place]) 06/24/2019   Chronic constipation 06/24/2019   ASCUS of cervix with negative high risk HPV 09/05/2018   GERD without esophagitis 04/11/2018   Hyperlipidemia 04/11/2018   Essential hypertension 04/11/2018   Hypothyroidism 04/26/2015   Bipolar I disorder, most recent episode (or current) manic ([place]) 04/25/2015   Delirium due to another medical condition 04/25/2015   Cannabis abuse 04/25/2015    Past Surgical History:  Procedure Laterality Date   BREAST BIOPSY Left 12/14/2021   [place] bx, venus marker, path pending   CESAREAN SECTION     COLONOSCOPY WITH PROPOFOL N/A 02/09/2022   Procedure: COLONOSCOPY WITH PROPOFOL;  Surgeon: Lin Landsman, MD;  Location: [facility];  Service: Endoscopy;  Laterality: N/A;  sleep apnea   CYSTOSCOPY W/ RETROGRADES Left 11/08/2018   Procedure: CYSTOSCOPY WITH RETROGRADE PYELOGRAM;  Surgeon: Billey Co, MD;  Location: [facility];  Service: Urology;  Laterality: Left;   CYSTOSCOPY/URETEROSCOPY/HOLMIUM LASER/STENT PLACEMENT Left 11/08/2018   Procedure: CYSTOSCOPY/URETEROSCOPY/HOLMIUM LASER/STENT PLACEMENT;  Surgeon: Billey Co, MD;  Location:  [facility];  Service: Urology;  Laterality: Left;   IR IMAGING GUIDED PORT INSERTION  05/11/2022   MOUTH SURGERY     wisdom teeth extraction   MOUTH SURGERY     teeth removal   POLYPECTOMY N/A 02/09/2022   Procedure: POLYPECTOMY;  Surgeon: Lin Landsman, MD;  Location: [facility];  Service: Endoscopy;  Laterality: N/A;   XI ROBOTIC ASSISTED LOWER ANTERIOR RESECTION N/A 04/21/2022   Procedure: XI ROBOTIC ASSISTED LOWER ANTERIOR RESECTION WITH COLOSTOMY, BILATERAL TAP BLOCK, ASSESSMENT OF TISSUE PERFUSSION WITH FIREFLY INJECTION;  Surgeon: Ileana Roup, MD;  Location: [facility];  Service: General;  Laterality: N/A;    Family History  Problem Relation Age of Onset   Depression Mother    Anxiety disorder Mother    Diabetes Mother    Hypertension Mother    Hyperlipidemia Mother    Cancer Mother    Uterine cancer Mother 41   Cervical cancer Mother 62   Colon cancer Father    Depression Brother    Anxiety disorder Brother    Cancer Maternal Aunt        unk types   Diabetes Mellitus II Maternal Grandmother    Hypercholesterolemia Maternal Grandmother    Breast cancer Maternal Grandmother    Cancer Paternal Grandmother    Diabetes Paternal Grandmother    Melanoma Paternal Grandmother    Stomach cancer Paternal Grandmother     Social History   Tobacco Use   Smoking status: Former    Packs/day: 0.25    Years: 34.00    Total pack years: 8.50    Types: Cigarettes    Start date: 05/13/1985   Smokeless tobacco: Never   Tobacco comments:    1 PPD 04/20/223  Substance Use Topics   Alcohol use: Not Currently    Alcohol/week: 4.0 standard drinks of alcohol    Types: 4 Glasses of wine per week    Comment: quit ETOH in Feb. 2023     Current Outpatient Medications:    acetaminophen (TYLENOL) 325 MG tablet, Take 650 mg by mouth every 6 (six) hours as needed for headache (pain)., Disp: , Rfl:    albuterol (PROVENTIL) (2.5 MG/3ML) 0.083% nebulizer solution, Take 3 mLs  (2.5 mg total) by nebulization every 6 (six) hours as needed for shortness of breath or wheezing., Disp: 75 mL, Rfl: 0   albuterol (VENTOLIN HFA) 108 (90 Base) MCG/ACT inhaler, Inhale 2 puffs into the lungs every 6 (six) hours as needed for wheezing or shortness of breath., Disp: 1 each, Rfl: 5   amantadine (SYMMETREL) 100 MG capsule, Take 100 mg by mouth 2 (two) times daily., Disp: , Rfl:    blood glucose meter kit and supplies, Dispense based on patient and insurance preference. Use up to four times daily as directed. (FOR ICD-10 E10.9, E11.9)., Disp: 1 each, Rfl: 0   Budeson-Glycopyrrol-Formoterol (BREZTRI AEROSPHERE) 160-9-4.8 MCG/ACT AERO, Inhale 2 puffs into the lungs in the morning and at bedtime., Disp: 5.9 g, Rfl: 11   buPROPion (WELLBUTRIN XL) 300 MG 24 hr tablet, Take 300 mg by mouth daily., Disp: , Rfl:    Cholecalciferol (VITAMIN D-3)  125 MCG (5000 UT) TABS, Take 5,000 Units by mouth daily., Disp: 30 tablet, Rfl: 1   diltiazem (CARDIZEM CD) 180 MG 24 hr capsule, Take 1 capsule (180 mg total) by mouth daily., Disp: 30 capsule, Rfl: 0   docusate sodium (COLACE) 100 MG capsule, Take 1 capsule (100 mg total) by mouth 2 (two) times daily., Disp: 60 capsule, Rfl: 1   FARXIGA 10 MG TABS tablet, TAKE 1 TABLET BY MOUTH DAILY BEFORE BREAKFAST., Disp: 30 tablet, Rfl: 1   gabapentin (NEURONTIN) 300 MG capsule, Take 300 mg by mouth 2 (two) times daily., Disp: , Rfl:    hydrocortisone cream 0.5 %, Apply 1 Application topically 2 (two) times daily as needed for itching., Disp: 30 g, Rfl: 1   Hydrocortisone, Perianal, 1 % CREA, Apply topically., Disp: , Rfl:    hydrOXYzine (VISTARIL) 50 MG capsule, Take 50 mg by mouth 3 (three) times daily as needed for anxiety., Disp: , Rfl:    icosapent Ethyl (VASCEPA) 1 g capsule, TAKE 2 CAPSULES BY MOUTH 2 TIMES DAILY, Disp: 120 capsule, Rfl: 3   insulin detemir (LEVEMIR FLEXPEN) 100 UNIT/ML FlexPen, Inject 4-10 Units into the skin daily., Disp: 15 mL, Rfl: 0    Insulin Pen Needle 32G X 4 MM MISC, 1 each by Does not apply route daily at 12 noon., Disp: 100 each, Rfl: 0   levothyroxine (SYNTHROID) 75 MCG tablet, TAKE 1 TABLET BY MOUTH DAILY BEFORE BREAKFAST, Disp: 90 tablet, Rfl: 0   loratadine (CLARITIN) 10 MG tablet, Take 10 mg by mouth every morning., Disp: , Rfl:    losartan (COZAAR) 25 MG tablet, Take 1 tablet (25 mg total) by mouth at bedtime., Disp: 30 tablet, Rfl: 3   nicotine (NICODERM CQ - DOSED IN MG/24 HOURS) 21 mg/24hr patch, Aplly 64m patch chest wall daily (okay to substitute generic), Disp: 28 patch, Rfl: 0   nicotine polacrilex (NICORETTE) 2 MG gum, Take 1 each (2 mg total) by mouth every 4 (four) hours while awake., Disp: 150 tablet, Rfl: 0   NON FORMULARY, Pt uses a cpap nightly, Disp: , Rfl:    paliperidone (INVEGA SUSTENNA) 234 MG/1.5ML SUSY injection, Inject 234 mg into the muscle every 30 (thirty) days. On or about the 14th of each month, Disp: , Rfl:    pantoprazole (PROTONIX) 40 MG tablet, TAKE 1 TABLET BY MOUTH DAILY, Disp: 90 tablet, Rfl: 0   Pediatric Multivit-Minerals-C (CHEWABLES MULTIVITAMIN PO), Take 1 tablet by mouth daily., Disp: , Rfl:    predniSONE (DELTASONE) 50 MG tablet, 1 tablet daily for next 3 days, Disp: 3 tablet, Rfl: 0   rosuvastatin (CRESTOR) 40 MG tablet, Take 1 tablet (40 mg total) by mouth every morning., Disp: 30 tablet, Rfl: 3   sertraline (ZOLOFT) 100 MG tablet, Take 200 mg by mouth every morning., Disp: , Rfl:    Spacer/Aero-Holding Chambers (AEROCHAMBER MV) inhaler, Use as instructed, Disp: 1 each, Rfl: 0  Allergies  Allergen Reactions   Metformin And Related Nausea And Vomiting   Nsaids Other (See Comments)    Stage 3 CKD   Perphenazine Other (See Comments)    Tremors, muscle weakness, tongue swelling   Sulfa Antibiotics Other (See Comments)    GI distress    Abilify [Aripiprazole] Rash   Penicillins Rash    She has taken amoxicillin without problems    I personally reviewed active problem  list, medication list, allergies, family history, social history, health maintenance with the patient/caregiver today.   ROS  Ten  systems reviewed and is negative except as mentioned in HPI   Objective  Vitals:   10/05/22 1010  BP: 110/72  Pulse: 99  Resp: 14  Temp: 97.8 F (36.6 C)  TempSrc: Oral  SpO2: 91%  Weight: 130 lb 3.2 oz (59.1 kg)  Height: 4' 11" (1.499 m)    Body mass index is 26.3 kg/m.  Physical Exam  Constitutional: Patient appears well-developed and well-nourished.  No distress.  HEENT: head atraumatic, normocephalic, pupils equal and reactive to light, neck supple, throat within normal limits Cardiovascular: Normal rate, regular rhythm and normal heart sounds.  No murmur heard. No BLE edema. Pulmonary/Chest: Effort normal , not using oxygen and pulse ox is normal, she has end inspiratory wheezing on right lower lung  No respiratory distress. Abdominal: Soft.  There is no tenderness. Psychiatric: Patient has a normal mood and affect. behavior is normal. Judgment and thought content normal.    PHQ2/9:    10/05/2022   10:12 AM 09/20/2022    8:59 AM 08/25/2022   10:15 AM 07/17/2022    1:02 PM 03/15/2022    2:07 PM  Depression screen PHQ 2/9  Decreased Interest 1 1 0 2 1  Down, Depressed, Hopeless 1 1 0 1 2  PHQ - 2 Score 2 2 0 3 3  Altered sleeping _0 0  Tired, decreased energy _1 Change in appetite _2 Feeling bad or failure about yourself  0 0 0 1 3  Trouble concentrating _3 0  Moving slowly or fidgety/restless 0 0 0 2 0  Suicidal thoughts 0 0 0 0 0  PHQ-9 Score _4 Difficult doing work/chores Somewhat difficult Somewhat difficult  Somewhat difficult     phq 9 is positive   Fall Risk:    10/05/2022   10:11 AM 09/20/2022    8:44 AM 08/25/2022    9:58 AM 07/17/2022    1:29 PM 03/15/2022    2:07 PM  Fall Risk   Falls in the past year? 0 0 0 0 0  Number falls in past yr:   0  0  Injury with Fall?   0  0  Risk  for fall due to : _5   Follow up Falls prevention discussed;Education provided;Falls evaluation completed Falls prevention discussed Falls prevention discussed Falls prevention discussed Falls prevention discussed    Assessment & Plan  1. Hospital discharge follow-up  - CBC with Differential/Platelet - Basic Metabolic Panel (BMET) - M40 and Folate Panel  2. Thrombocytopenia (HCC)  - CBC with Differential/Platelet  3. Abnormal red blood cells  - B12 and Folate Panel  4. High risk medication use  - B12 and Folate Panel  5. Dyslipidemia associated with type 2 diabetes mellitus (HCC)  - Insulin Pen Needle 32G X 4 MM MISC; 1 each by Does not apply route daily at 12 noon.  Dispense: 100 each; Refill: 0   - Insulin Pen Needle 32G X 4 MM MISC; 1 each by Does not apply route daily at 12 noon.  Dispense: 100 each; Refill: 0   She never filled Semglee given by hospital, we contacted pharmacy and advised not to fill that, she picked up farxiga and Levemir this afternoon

## 2022-10-05 ENCOUNTER — Encounter: Payer: Self-pay | Admitting: Family Medicine

## 2022-10-05 ENCOUNTER — Ambulatory Visit (INDEPENDENT_AMBULATORY_CARE_PROVIDER_SITE_OTHER): Payer: Medicare Other | Admitting: Family Medicine

## 2022-10-05 VITALS — BP 110/72 | HR 99 | Temp 97.8°F | Resp 14 | Ht 59.0 in | Wt 130.2 lb

## 2022-10-05 DIAGNOSIS — Z79899 Other long term (current) drug therapy: Secondary | ICD-10-CM | POA: Diagnosis not present

## 2022-10-05 DIAGNOSIS — E1169 Type 2 diabetes mellitus with other specified complication: Secondary | ICD-10-CM

## 2022-10-05 DIAGNOSIS — R718 Other abnormality of red blood cells: Secondary | ICD-10-CM

## 2022-10-05 DIAGNOSIS — Z09 Encounter for follow-up examination after completed treatment for conditions other than malignant neoplasm: Secondary | ICD-10-CM

## 2022-10-05 DIAGNOSIS — E785 Hyperlipidemia, unspecified: Secondary | ICD-10-CM

## 2022-10-05 DIAGNOSIS — D696 Thrombocytopenia, unspecified: Secondary | ICD-10-CM | POA: Diagnosis not present

## 2022-10-05 MED ORDER — INSULIN PEN NEEDLE 32G X 4 MM MISC: 1.0000 | Freq: Every day | 0 refills | Status: DC

## 2022-10-05 MED ORDER — LEVEMIR FLEXPEN 100 UNIT/ML ~~LOC~~ SOPN: 4.0000 [IU] | PEN_INJECTOR | Freq: Every day | SUBCUTANEOUS | 0 refills | Status: DC

## 2022-10-05 NOTE — Patient Instructions (Signed)
Start at 4 units of insulin in the mornings, check fsbs every morning before breakfast if glucose is above 140 for 3 days you can go up on insulin by 2 units. If glucose is below 120 for 3 days go down on insulin by 2 units.

## 2022-10-06 ENCOUNTER — Encounter: Payer: Self-pay | Admitting: Oncology

## 2022-10-06 LAB — CBC WITH DIFFERENTIAL/PLATELET
Absolute Monocytes: 496 cells/uL (ref 200–950)
Basophils Absolute: 24 cells/uL (ref 0–200)
Basophils Relative: 0.2 %
Eosinophils Absolute: 48 cells/uL (ref 15–500)
Eosinophils Relative: 0.4 %
HCT: 39.5 % (ref 35.0–45.0)
Hemoglobin: 13.5 g/dL (ref 11.7–15.5)
Lymphs Abs: 436 cells/uL — ABNORMAL LOW (ref 850–3900)
MCH: 35.4 pg — ABNORMAL HIGH (ref 27.0–33.0)
MCHC: 34.2 g/dL (ref 32.0–36.0)
MCV: 103.7 fL — ABNORMAL HIGH (ref 80.0–100.0)
MPV: 9.4 fL (ref 7.5–12.5)
Monocytes Relative: 4.1 %
Neutro Abs: 11096 cells/uL — ABNORMAL HIGH (ref 1500–7800)
Neutrophils Relative %: 91.7 %
Platelets: 138 10*3/uL — ABNORMAL LOW (ref 140–400)
RBC: 3.81 10*6/uL (ref 3.80–5.10)
RDW: 15.3 % — ABNORMAL HIGH (ref 11.0–15.0)
Total Lymphocyte: 3.6 %
WBC: 12.1 10*3/uL — ABNORMAL HIGH (ref 3.8–10.8)

## 2022-10-06 LAB — BASIC METABOLIC PANEL
BUN/Creatinine Ratio: 23 (calc) — ABNORMAL HIGH (ref 6–22)
BUN: 25 mg/dL (ref 7–25)
CO2: 25 mmol/L (ref 20–32)
Calcium: 9.8 mg/dL (ref 8.6–10.2)
Chloride: 101 mmol/L (ref 98–110)
Creat: 1.08 mg/dL — ABNORMAL HIGH (ref 0.50–0.99)
Glucose, Bld: 227 mg/dL — ABNORMAL HIGH (ref 65–99)
Potassium: 4.1 mmol/L (ref 3.5–5.3)
Sodium: 139 mmol/L (ref 135–146)

## 2022-10-06 LAB — B12 AND FOLATE PANEL
Folate: 9.3 ng/mL
Vitamin B-12: 320 pg/mL (ref 200–1100)

## 2022-10-07 ENCOUNTER — Other Ambulatory Visit: Payer: Self-pay | Admitting: Oncology

## 2022-10-07 ENCOUNTER — Other Ambulatory Visit: Payer: Self-pay | Admitting: Nurse Practitioner

## 2022-10-07 DIAGNOSIS — E032 Hypothyroidism due to medicaments and other exogenous substances: Secondary | ICD-10-CM

## 2022-10-07 DIAGNOSIS — K219 Gastro-esophageal reflux disease without esophagitis: Secondary | ICD-10-CM

## 2022-10-09 ENCOUNTER — Encounter: Payer: Self-pay | Admitting: Oncology

## 2022-10-09 ENCOUNTER — Inpatient Hospital Stay: Payer: Medicare Other | Attending: Oncology

## 2022-10-09 ENCOUNTER — Encounter: Payer: Self-pay | Admitting: Nurse Practitioner

## 2022-10-09 ENCOUNTER — Encounter: Payer: Self-pay | Admitting: Radiation Oncology

## 2022-10-09 ENCOUNTER — Ambulatory Visit
Admission: RE | Admit: 2022-10-09 | Discharge: 2022-10-09 | Disposition: A | Discharge status: Home / Self Care | Payer: Medicare Other | Source: Ambulatory Visit | Attending: Radiation Oncology | Admitting: Radiation Oncology

## 2022-10-09 VITALS — BP 112/78 | HR 87 | Temp 97.0°F | Resp 16 | Ht 59.0 in | Wt 134.1 lb

## 2022-10-09 DIAGNOSIS — Z923 Personal history of irradiation: Secondary | ICD-10-CM | POA: Diagnosis not present

## 2022-10-09 DIAGNOSIS — Z9221 Personal history of antineoplastic chemotherapy: Secondary | ICD-10-CM | POA: Insufficient documentation

## 2022-10-09 DIAGNOSIS — J441 Chronic obstructive pulmonary disease with (acute) exacerbation: Secondary | ICD-10-CM | POA: Insufficient documentation

## 2022-10-09 DIAGNOSIS — I700 Atherosclerosis of aorta: Secondary | ICD-10-CM | POA: Insufficient documentation

## 2022-10-09 DIAGNOSIS — K219 Gastro-esophageal reflux disease without esophagitis: Secondary | ICD-10-CM | POA: Insufficient documentation

## 2022-10-09 DIAGNOSIS — E1122 Type 2 diabetes mellitus with diabetic chronic kidney disease: Secondary | ICD-10-CM | POA: Insufficient documentation

## 2022-10-09 DIAGNOSIS — Z800 Family history of malignant neoplasm of digestive organs: Secondary | ICD-10-CM | POA: Insufficient documentation

## 2022-10-09 DIAGNOSIS — Z79899 Other long term (current) drug therapy: Secondary | ICD-10-CM | POA: Insufficient documentation

## 2022-10-09 DIAGNOSIS — J9601 Acute respiratory failure with hypoxia: Secondary | ICD-10-CM | POA: Insufficient documentation

## 2022-10-09 DIAGNOSIS — D696 Thrombocytopenia, unspecified: Secondary | ICD-10-CM | POA: Insufficient documentation

## 2022-10-09 DIAGNOSIS — C20 Malignant neoplasm of rectum: Secondary | ICD-10-CM

## 2022-10-09 DIAGNOSIS — K5900 Constipation, unspecified: Secondary | ICD-10-CM | POA: Insufficient documentation

## 2022-10-09 DIAGNOSIS — E785 Hyperlipidemia, unspecified: Secondary | ICD-10-CM | POA: Insufficient documentation

## 2022-10-09 DIAGNOSIS — D702 Other drug-induced agranulocytosis: Secondary | ICD-10-CM | POA: Insufficient documentation

## 2022-10-09 DIAGNOSIS — Z803 Family history of malignant neoplasm of breast: Secondary | ICD-10-CM | POA: Insufficient documentation

## 2022-10-09 DIAGNOSIS — E039 Hypothyroidism, unspecified: Secondary | ICD-10-CM | POA: Insufficient documentation

## 2022-10-09 DIAGNOSIS — I129 Hypertensive chronic kidney disease with stage 1 through stage 4 chronic kidney disease, or unspecified chronic kidney disease: Secondary | ICD-10-CM | POA: Insufficient documentation

## 2022-10-09 DIAGNOSIS — Z79890 Hormone replacement therapy (postmenopausal): Secondary | ICD-10-CM | POA: Insufficient documentation

## 2022-10-09 DIAGNOSIS — D122 Benign neoplasm of ascending colon: Secondary | ICD-10-CM | POA: Insufficient documentation

## 2022-10-09 DIAGNOSIS — Z933 Colostomy status: Secondary | ICD-10-CM | POA: Insufficient documentation

## 2022-10-09 DIAGNOSIS — Z7984 Long term (current) use of oral hypoglycemic drugs: Secondary | ICD-10-CM | POA: Insufficient documentation

## 2022-10-09 DIAGNOSIS — Z794 Long term (current) use of insulin: Secondary | ICD-10-CM | POA: Insufficient documentation

## 2022-10-09 DIAGNOSIS — Z87442 Personal history of urinary calculi: Secondary | ICD-10-CM | POA: Insufficient documentation

## 2022-10-09 DIAGNOSIS — F209 Schizophrenia, unspecified: Secondary | ICD-10-CM | POA: Insufficient documentation

## 2022-10-09 DIAGNOSIS — E1140 Type 2 diabetes mellitus with diabetic neuropathy, unspecified: Secondary | ICD-10-CM | POA: Insufficient documentation

## 2022-10-09 DIAGNOSIS — R197 Diarrhea, unspecified: Secondary | ICD-10-CM | POA: Insufficient documentation

## 2022-10-09 DIAGNOSIS — G4730 Sleep apnea, unspecified: Secondary | ICD-10-CM | POA: Insufficient documentation

## 2022-10-09 DIAGNOSIS — F17210 Nicotine dependence, cigarettes, uncomplicated: Secondary | ICD-10-CM | POA: Insufficient documentation

## 2022-10-09 DIAGNOSIS — F319 Bipolar disorder, unspecified: Secondary | ICD-10-CM | POA: Insufficient documentation

## 2022-10-09 DIAGNOSIS — T451X5A Adverse effect of antineoplastic and immunosuppressive drugs, initial encounter: Secondary | ICD-10-CM | POA: Insufficient documentation

## 2022-10-09 MED FILL — Dexamethasone Sodium Phosphate Inj 100 MG/10ML: INTRAMUSCULAR | Qty: 1 | Status: AC

## 2022-10-09 NOTE — Progress Notes (Signed)
Radiation Oncology Follow up Note  Name: April Luna   Date:   10/09/2022 MRN:  [id number] DOB: 1971-02-25    This 51 y.o. female presents to the clinic today for 1 month follow-up status post adjuvant chemotherapy and patient status post APR for a stage III (T3N1BM0) adenocarcinoma of the rectum who also completed FOLFOX chemotherapy.  REFERRING PROVIDER: Steele Sizer, MD  HPI: Patient is a 51 year old female now out 1 month having completed adjuvant chemotherapy with Xeloda as well as radiation therapy for stage III adenocarcinoma of the rectum status post APR.  She is seen today in routine follow-up and is doing fairly well her colostomy is functioning well.  She is having no abdominal pain or discomfort at this time.  Bowel function tends to be a little constipated.  No lower urinary tract symptoms.  COMPLICATIONS OF TREATMENT: none  FOLLOW UP COMPLIANCE: keeps appointments   PHYSICAL EXAM:  BP 112/78 (BP Location: Left Arm, Patient Position: Sitting, Cuff Size: Normal)   Pulse 87   Temp (!) 97 F (36.1 C) (Tympanic)   Resp 16   Ht '4\' 11"'$  (1.499 m) Comment: stated HT  Wt 134 lb 1.6 oz (60.8 kg)   BMI 27.08 kg/m  Patient has flunked functioning colostomy.  Well-developed well-nourished patient in NAD. HEENT reveals PERLA, EOMI, discs not visualized.  Oral cavity is clear. No oral mucosal lesions are identified. Neck is clear without evidence of cervical or supraclavicular adenopathy. Lungs are clear to A&P. Cardiac examination is essentially unremarkable with regular rate and rhythm without murmur rub or thrill. Abdomen is benign with no organomegaly or masses noted. Motor sensory and DTR levels are equal and symmetric in the upper and lower extremities. Cranial nerves II through XII are grossly intact. Proprioception is intact. No peripheral adenopathy or edema is identified. No motor or sensory levels are noted. Crude visual fields are within normal range.  RADIOLOGY  RESULTS: No current films for review  PLAN: Present time patient is at 1 month from adjuvant chemotherapy with Xeloda and radiation.  Colostomy is functioning well.  She is having no significant side effects at this time.  I have asked to see her back in 4 to 5 months for follow-up.  Anticipate a follow-up CT scan of abdomen pelvis prior to that time.  She continues close follow-up care with medical oncology.  Patient knows to call with any concerns.  I would like to take this opportunity to thank you for allowing me to participate in the care of your patient.Noreene Filbert, MD

## 2022-10-09 NOTE — Telephone Encounter (Signed)
Requested Prescriptions  Pending Prescriptions Disp Refills  . levothyroxine (SYNTHROID) 75 MCG tablet [Pharmacy Med Name: LEVOTHYROXINE SODIUM 75 MCG TAB] 90 tablet 0    Sig: TAKE 1 TABLET BY MOUTH DAILY BEFORE BREAKFAST     Endocrinology:  Hypothyroid Agents Passed - 10/07/2022 10:12 AM      Passed - TSH in normal range and within 360 days    TSH  Date Value Ref Range Status  07/17/2022 2.20 mIU/L Final    Comment:              Reference Range .           > or = 20 Years  0.40-4.50 .                Pregnancy Ranges           First trimester    0.26-2.66           Second trimester   0.55-2.73           Third trimester    0.43-2.91          Passed - Valid encounter within last 12 months    Recent Outpatient Visits          4 days ago Hospital discharge follow-up   [facility] Steele Sizer, MD   2 weeks ago Hypoxemia   [facility] Steele Sizer, MD   1 month ago Encounter for [employer] annual wellness exam   [facility] Steele Sizer, MD   2 months ago Type 2 diabetes mellitus with microalbuminuria, without long-term current use of insulin [facility])   [facility] Steele Sizer, MD   6 months ago Type 2 diabetes mellitus with microalbuminuria, without long-term current use of insulin [facility])   [facility] Steele Sizer, MD      Future Appointments            In 1 month Ancil Boozer, Drue Stager, MD [facility] [place]   In 10 months  [facility] [place]           . pantoprazole (PROTONIX) 40 MG tablet [Pharmacy Med Name: PANTOPRAZOLE SODIUM 40 MG DR TAB] 90 tablet 0    Sig: TAKE 1 TABLET BY MOUTH DAILY     Gastroenterology: Proton Pump Inhibitors Passed - 10/07/2022 10:12 AM      Passed - Valid encounter within last 12 months    Recent Outpatient Visits          4 days ago Hospital discharge follow-up   [facility]  Steele Sizer, MD   2 weeks ago Hypoxemia   [facility] Steele Sizer, MD   1 month ago Encounter for [employer] annual wellness exam   [facility], Drue Stager, MD   2 months ago Type 2 diabetes mellitus with microalbuminuria, without long-term current use of insulin [facility]   [facility] Steele Sizer, MD   6 months ago Type 2 diabetes mellitus with microalbuminuria, without long-term current use of insulin [facility])   [facility] Steele Sizer, MD      Future Appointments            In 1 month Ancil Boozer, Drue Stager, MD [facility], [place]   In 10 months  [facility], [facility]

## 2022-10-10 ENCOUNTER — Inpatient Hospital Stay: Payer: Medicare Other

## 2022-10-10 ENCOUNTER — Encounter: Payer: Self-pay | Admitting: Oncology

## 2022-10-10 ENCOUNTER — Inpatient Hospital Stay (HOSPITAL_BASED_OUTPATIENT_CLINIC_OR_DEPARTMENT_OTHER): Payer: Medicare Other | Admitting: Oncology

## 2022-10-10 VITALS — BP 95/68 | HR 80 | Resp 18 | Ht 59.0 in | Wt 134.0 lb

## 2022-10-10 VITALS — BP 103/73 | HR 86 | Temp 98.9°F | Resp 16

## 2022-10-10 DIAGNOSIS — D122 Benign neoplasm of ascending colon: Secondary | ICD-10-CM | POA: Diagnosis not present

## 2022-10-10 DIAGNOSIS — C20 Malignant neoplasm of rectum: Secondary | ICD-10-CM | POA: Diagnosis present

## 2022-10-10 DIAGNOSIS — T451X5A Adverse effect of antineoplastic and immunosuppressive drugs, initial encounter: Secondary | ICD-10-CM | POA: Diagnosis not present

## 2022-10-10 DIAGNOSIS — Z7984 Long term (current) use of oral hypoglycemic drugs: Secondary | ICD-10-CM | POA: Diagnosis not present

## 2022-10-10 DIAGNOSIS — Z5111 Encounter for antineoplastic chemotherapy: Secondary | ICD-10-CM

## 2022-10-10 DIAGNOSIS — Z794 Long term (current) use of insulin: Secondary | ICD-10-CM | POA: Diagnosis not present

## 2022-10-10 DIAGNOSIS — Z79899 Other long term (current) drug therapy: Secondary | ICD-10-CM | POA: Diagnosis not present

## 2022-10-10 DIAGNOSIS — G4730 Sleep apnea, unspecified: Secondary | ICD-10-CM | POA: Diagnosis not present

## 2022-10-10 DIAGNOSIS — Z720 Tobacco use: Secondary | ICD-10-CM | POA: Diagnosis not present

## 2022-10-10 DIAGNOSIS — E1122 Type 2 diabetes mellitus with diabetic chronic kidney disease: Secondary | ICD-10-CM | POA: Diagnosis not present

## 2022-10-10 DIAGNOSIS — N1831 Chronic kidney disease, stage 3a: Secondary | ICD-10-CM

## 2022-10-10 DIAGNOSIS — I129 Hypertensive chronic kidney disease with stage 1 through stage 4 chronic kidney disease, or unspecified chronic kidney disease: Secondary | ICD-10-CM | POA: Diagnosis not present

## 2022-10-10 DIAGNOSIS — E039 Hypothyroidism, unspecified: Secondary | ICD-10-CM | POA: Diagnosis not present

## 2022-10-10 DIAGNOSIS — E785 Hyperlipidemia, unspecified: Secondary | ICD-10-CM | POA: Diagnosis not present

## 2022-10-10 DIAGNOSIS — F209 Schizophrenia, unspecified: Secondary | ICD-10-CM | POA: Diagnosis not present

## 2022-10-10 DIAGNOSIS — J441 Chronic obstructive pulmonary disease with (acute) exacerbation: Secondary | ICD-10-CM | POA: Diagnosis not present

## 2022-10-10 DIAGNOSIS — Z79890 Hormone replacement therapy (postmenopausal): Secondary | ICD-10-CM | POA: Diagnosis not present

## 2022-10-10 DIAGNOSIS — R197 Diarrhea, unspecified: Secondary | ICD-10-CM | POA: Diagnosis not present

## 2022-10-10 DIAGNOSIS — K219 Gastro-esophageal reflux disease without esophagitis: Secondary | ICD-10-CM | POA: Diagnosis not present

## 2022-10-10 DIAGNOSIS — E1140 Type 2 diabetes mellitus with diabetic neuropathy, unspecified: Secondary | ICD-10-CM | POA: Diagnosis not present

## 2022-10-10 DIAGNOSIS — F17210 Nicotine dependence, cigarettes, uncomplicated: Secondary | ICD-10-CM | POA: Diagnosis not present

## 2022-10-10 DIAGNOSIS — K5900 Constipation, unspecified: Secondary | ICD-10-CM | POA: Diagnosis not present

## 2022-10-10 DIAGNOSIS — F319 Bipolar disorder, unspecified: Secondary | ICD-10-CM | POA: Diagnosis not present

## 2022-10-10 DIAGNOSIS — J9601 Acute respiratory failure with hypoxia: Secondary | ICD-10-CM | POA: Diagnosis not present

## 2022-10-10 DIAGNOSIS — I700 Atherosclerosis of aorta: Secondary | ICD-10-CM | POA: Diagnosis not present

## 2022-10-10 DIAGNOSIS — D696 Thrombocytopenia, unspecified: Secondary | ICD-10-CM | POA: Diagnosis not present

## 2022-10-10 DIAGNOSIS — D702 Other drug-induced agranulocytosis: Secondary | ICD-10-CM | POA: Diagnosis not present

## 2022-10-10 LAB — CBC WITH DIFFERENTIAL/PLATELET
Abs Immature Granulocytes: 0.03 K/uL (ref 0.00–0.07)
Basophils Absolute: 0 K/uL (ref 0.0–0.1)
Basophils Relative: 0 %
Eosinophils Absolute: 0.2 K/uL (ref 0.0–0.5)
Eosinophils Relative: 2 %
HCT: 36.6 % (ref 36.0–46.0)
Hemoglobin: 12.7 g/dL (ref 12.0–15.0)
Immature Granulocytes: 0 %
Lymphocytes Relative: 7 %
Lymphs Abs: 0.7 K/uL (ref 0.7–4.0)
MCH: 34.8 pg — ABNORMAL HIGH (ref 26.0–34.0)
MCHC: 34.7 g/dL (ref 30.0–36.0)
MCV: 100.3 fL — ABNORMAL HIGH (ref 80.0–100.0)
Monocytes Absolute: 0.6 K/uL (ref 0.1–1.0)
Monocytes Relative: 6 %
Neutro Abs: 8.1 K/uL — ABNORMAL HIGH (ref 1.7–7.7)
Neutrophils Relative %: 85 %
Platelets: 134 K/uL — ABNORMAL LOW (ref 150–400)
RBC: 3.65 MIL/uL — ABNORMAL LOW (ref 3.87–5.11)
RDW: 14.3 % (ref 11.5–15.5)
WBC: 9.7 K/uL (ref 4.0–10.5)
nRBC: 0 % (ref 0.0–0.2)

## 2022-10-10 LAB — COMPREHENSIVE METABOLIC PANEL WITH GFR
ALT: 30 U/L (ref 0–44)
AST: 33 U/L (ref 15–41)
Albumin: 3.9 g/dL (ref 3.5–5.0)
Alkaline Phosphatase: 78 U/L (ref 38–126)
Anion gap: 8 (ref 5–15)
BUN: 16 mg/dL (ref 6–20)
CO2: 27 mmol/L (ref 22–32)
Calcium: 9.1 mg/dL (ref 8.9–10.3)
Chloride: 103 mmol/L (ref 98–111)
Creatinine, Ser: 1.02 mg/dL — ABNORMAL HIGH (ref 0.44–1.00)
GFR, Estimated: >60 mL/min (ref >60)
Glucose, Bld: 147 mg/dL — ABNORMAL HIGH (ref 70–99)
Potassium: 3.6 mmol/L (ref 3.5–5.1)
Sodium: 138 mmol/L (ref 135–145)
Total Bilirubin: 0.6 mg/dL (ref 0.3–1.2)
Total Protein: 6.9 g/dL (ref 6.5–8.1)

## 2022-10-10 MED ORDER — LEUCOVORIN CALCIUM INJECTION 350 MG
650.0000 mg | Freq: Once | INTRAVENOUS | Status: AC
  Administered 2022-10-10: 650 mg via INTRAVENOUS
  Filled 2022-10-10: qty 32.5

## 2022-10-10 MED ORDER — PALONOSETRON HCL INJECTION 0.25 MG/5ML
0.2500 mg | Freq: Once | INTRAVENOUS | Status: AC
  Administered 2022-10-10: 0.25 mg via INTRAVENOUS
  Filled 2022-10-10: qty 5

## 2022-10-10 MED ORDER — SODIUM CHLORIDE 0.9 % IV SOLN
10.0000 mg | Freq: Once | INTRAVENOUS | Status: AC
  Administered 2022-10-10: 10 mg via INTRAVENOUS
  Filled 2022-10-10: qty 10

## 2022-10-10 MED ORDER — DEXTROSE 5 % IV SOLN
Freq: Once | INTRAVENOUS | Status: AC
  Filled 2022-10-10: qty 250

## 2022-10-10 MED ORDER — SODIUM CHLORIDE 0.9 % IV SOLN
2400.0000 mg/m2 | INTRAVENOUS | Status: DC
  Administered 2022-10-10: 3800 mg via INTRAVENOUS
  Filled 2022-10-10: qty 76

## 2022-10-10 MED ORDER — OXALIPLATIN CHEMO INJECTION 100 MG/20ML
75.0000 mg/m2 | Freq: Once | INTRAVENOUS | Status: AC
  Administered 2022-10-10: 120 mg via INTRAVENOUS
  Filled 2022-10-10: qty 20

## 2022-10-10 NOTE — Progress Notes (Signed)
Patient was hospitalized for pneumonia, she was started on cardizem due to heart rate spiking up.

## 2022-10-10 NOTE — Assessment & Plan Note (Signed)
Encourage oral hydration. ?

## 2022-10-10 NOTE — Assessment & Plan Note (Signed)
Chemotherapy plan as listed above 

## 2022-10-10 NOTE — Assessment & Plan Note (Addendum)
Stage III. pT3 pN1b cM0, s/p APR. Currently on adjuvant chemotherapy. S/p FOLFOX x 4 cycles 09/12/22 S/p concurrent chemotherapy [Xeloda 1300 mg BID] with RT  Labs reviewed and discussed with patient Proceed with FOLFOX Intermittent diarrhea [increase ostomy output], Grade 1 neuropathy, recent respiratory failure due to COPD/Pneumonia Dose reduced oxliplatin '75mg'$ /m2, omit 5-FU bolus

## 2022-10-10 NOTE — Progress Notes (Signed)
Hematology/Oncology Progress note [phone] [phone]            Patient Care Team: Steele Sizer, MD as PCP - General (Family Medicine) Bjorn Loser, MD as Consulting Physician (Urology) Germaine Pomfret, [facility] (Pharmacist) Clent Jacks, RN as Oncology Nurse Navigator [name], MD as Consulting Physician (Oncology) Noreene Filbert, MD as Consulting Physician (Radiation Oncology) [place]. (Psychiatry) Chesley Mires, MD as Consulting Physician (Pulmonary Disease) Ileana Roup, MD as Consulting Physician (General Surgery) Lin Landsman, MD as Consulting Physician (Gastroenterology)   ASSESSMENT & PLAN:   Cancer Staging  Rectal cancer [facility]) Staging form: Colon and Rectum, AJCC 8th Edition - Pathologic stage from 05/03/2022: Stage IIIB (pT3, pN1b, cM0) - Signed by [name], MD on 05/03/2022   Rectal cancer ([place]) Stage III. pT3 pN1b cM0, s/p APR. Currently on adjuvant chemotherapy. S/p FOLFOX x 4 cycles 09/12/22 S/p concurrent chemotherapy [Xeloda 1300 mg BID] with RT  Labs reviewed and discussed with patient Proceed with FOLFOX Intermittent diarrhea [increase ostomy output], Grade 1 neuropathy, recent respiratory failure due to COPD/Pneumonia Dose reduced oxliplatin 74m/m2, omit 5-FU bolus   Encounter for antineoplastic chemotherapy Chemotherapy plan as listed above  Tobacco use Recently quitted.  Encouraged her cessation effort.    Thrombocytopenia (HCC) Trending low, close monitor.  CKD (chronic kidney disease) Encourage oral hydration    Orders Placed This Encounter  Procedures   CBC with Differential    Standing Status:   Future    Standing Expiration Date:   11/22/2023   Comprehensive metabolic panel    Standing Status:   Future    Standing Expiration Date:   11/22/2023     Follow-up  2 weeks lab MD FJefferson All questions were answered. The patient knows to  call the clinic with any problems, questions or concerns.  ZEarlie Server MD, PhD [facility] H[facility] Oncology 10/10/2022      CHIEF COMPLAINTS/REASON FOR VISIT:  Follow-up for rectal cancer treatments.  HISTORY OF PRESENTING ILLNESS:   April LYDONis a  51y.o.  female presents for treatment of rectal cancer Oncology History  [place]  02/26/2022 Imaging   MRI PELVIS WITHOUT CONTRAST- By imaging, rectal cancer stage:  T1/T2, N0, Mx    03/02/2022 Imaging   CT CHEST AND ABDOMEN WITH CONTRAST 1. No convincing evidence of metastatic disease within the chest or abdomen. 2. Atrophic left kidney with multifocal renal scarring and cortical calcifications as well as nonobstructive renal stones measuring up to 5 mm. 3. Prominent left-sided predominant retroperitoneal lymph nodes measuring up to 8 mm near the level of the renal hilum, overall decreased in size dating back to CT September 19, 2018 and favored reactive related to left renal inflammation. 4.  Aortic Atherosclerosis (ICD10-I70.0).   03/27/2022 Genetic Testing    [facility] CustomNext+RNA cancer panel found no pathogenic mutations.    04/21/2022 Initial Diagnosis   Rectal cancer - baseline CEA 3.6 -02/09/2022, patient had colonoscopy which showed renal mass 10 cm from anal verge.  5 mm polyp in ascending colon.  Removed and retrieved. Pathology showed rectal adenocarcinoma.  The polyp in the ascending colon is a tubular adenoma. -04/21/2022, patient underwent robotic assisted ultralow anterior resection. Pathology showed moderately differentiated adenocarcinoma, 4.5 cm in maximal extent, with focal extension through muscularis propria into perirectal soft tissue.  3 lymph nodes positive for metastatic carcinoma.  Negative margin.  pT3 pN1b, MSI stable.   05/03/2022 Cancer  Staging   Staging form: Colon and Rectum, AJCC 8th Edition - Pathologic stage from 05/03/2022: Stage IIIB (pT3, pN1b, cM0) - Signed by [name], MD on  05/03/2022 Stage prefix: Initial diagnosis   05/11/2022 Miscellaneous   Medi port placed by Dr.White   05/26/2022 -  Chemotherapy   FOLFOX Q2 weeks x 4   05/26/2022 - 07/09/2022 Chemotherapy   Patient is on Treatment Plan : COLORECTAL FOLFOX q14d x 8 cycles     05/26/2022 -  Chemotherapy   Patient is on Treatment Plan : COLORECTAL FOLFOX q14d x 4 months     07/27/2022 -  Chemotherapy   Concurrent chemotherapy- Xeloda  1322m BID and radiation.    07/27/2022 - 09/12/2022 Radiation Therapy    concurrent chemotherapy [Xeloda 1300 mg BID] with RT    09/20/2022 Imaging   CT Angiogram chest PET protocol There is no evidence of pulmonary artery embolism. There is no evidence of thoracic aortic dissection.   Small linear patchy alveolar infiltrate is seen in medial segment of right middle lobe suggesting atelectasis/pneumonia.     09/28/2022 - 09/30/2022 Hospital Admission   Admission due to acute respiratory failure with hypoxia, COPD exacerbation   09/28/2022 Imaging   CT angio chest PE protocol 1. Negative for acute PE or thoracic aortic dissection. 2. New ground-glass infiltrates anteriorly in bilateral upper lobes,may represent atypical edema, infectious or inflammatory process. 3.  Aortic Atherosclerosis     Patient has bipolar and schizophrenia..  Patient is married and lives at home with her husband and son.  INTERVAL HISTORY April PASKOis a 51y.o. female who has above history reviewed by me today presents for follow up visit for rectal cancer.  She was hospitalized due to acute respiratory failure, pneumonia.  Currently she is off oxygen.  Quitted smoking recently.  Intermittent constipation/diarrhea.  BP was low and patient's PCP suggested her to take BP med at night.     Review of Systems  Constitutional:  Negative for appetite change, chills, fatigue and fever.  HENT:   Negative for hearing loss and voice change.   Eyes:  Negative for eye problems.  Respiratory:   Positive for shortness of breath. Negative for chest tightness and cough.   Cardiovascular:  Negative for chest pain.  Gastrointestinal:  Negative for abdominal distention, abdominal pain and blood in stool.       Diarrhea alternating with constipation.  Endocrine: Negative for hot flashes.  Genitourinary:  Negative for difficulty urinating and frequency.   Musculoskeletal:  Negative for arthralgias.  Skin:  Negative for itching and rash.  Neurological:  Negative for extremity weakness.  Hematological:  Negative for adenopathy.  Psychiatric/Behavioral:  Negative for confusion.     MEDICAL HISTORY:  Past Medical History:  Diagnosis Date   Allergy    pollen   Anxiety    Arthritis    right hip   Bipolar 1 disorder ([place]    Cancer (HCC)    rectal   Chronic kidney disease    COPD (chronic obstructive pulmonary disease) (HCC)    Depression    Family history of adverse reaction to anesthesia    grand father had a stroke during anesthesia   Family history of breast cancer    Family history of colon cancer    Family history of uterine cancer    GERD (gastroesophageal reflux disease)    History of kidney stones    Hyperlipidemia    Hypertension    Hypothyroidism  Panic attack    Pneumonia    Psoriasis    Sleep apnea 08/11/2021   No CPAP   Type 2 diabetes mellitus with microalbuminuria, without long-term current use of insulin ([place]) 06/24/2019    SURGICAL HISTORY: Past Surgical History:  Procedure Laterality Date   BREAST BIOPSY Left 12/14/2021   [place] bx, venus marker, path pending   CESAREAN SECTION     COLONOSCOPY WITH PROPOFOL N/A 02/09/2022   Procedure: COLONOSCOPY WITH PROPOFOL;  Surgeon: Lin Landsman, MD;  Location: [place];  Service: Endoscopy;  Laterality: N/A;  sleep apnea   CYSTOSCOPY W/ RETROGRADES Left 11/08/2018   Procedure: CYSTOSCOPY WITH RETROGRADE PYELOGRAM;  Surgeon: Billey Co, MD;  Location: [facility];  Service: Urology;   Laterality: Left;   CYSTOSCOPY/URETEROSCOPY/HOLMIUM LASER/STENT PLACEMENT Left 11/08/2018   Procedure: CYSTOSCOPY/URETEROSCOPY/HOLMIUM LASER/STENT PLACEMENT;  Surgeon: Billey Co, MD;  Location: [facility];  Service: Urology;  Laterality: Left;   IR IMAGING GUIDED PORT INSERTION  05/11/2022   MOUTH SURGERY     wisdom teeth extraction   MOUTH SURGERY     teeth removal   POLYPECTOMY N/A 02/09/2022   Procedure: POLYPECTOMY;  Surgeon: Lin Landsman, MD;  Location: [place];  Service: Endoscopy;  Laterality: N/A;   XI ROBOTIC ASSISTED LOWER ANTERIOR RESECTION N/A 04/21/2022   Procedure: XI ROBOTIC ASSISTED LOWER ANTERIOR RESECTION WITH COLOSTOMY, BILATERAL TAP BLOCK, ASSESSMENT OF TISSUE PERFUSSION WITH FIREFLY INJECTION;  Surgeon: Ileana Roup, MD;  Location: [facility];  Service: General;  Laterality: N/A;    SOCIAL HISTORY: Social History   Socioeconomic History   Marital status: Married    Spouse name: Montine Circle   Number of children: 1   Years of education: 12   Highest education level: High school graduate  Occupational History   Occupation: unemployed    Comment: disabled  Tobacco Use   Smoking status: Former    Packs/day: 0.25    Years: 34.00    Total pack years: 8.50    Types: Cigarettes    Start date: 05/13/1985    Quit date: 10/04/2022    Years since quitting: 0.0   Smokeless tobacco: Never   Tobacco comments:    1 PPD 04/20/223  Vaping Use   Vaping Use: Former   Start date: 05/25/2018   Quit date: 09/24/2018  Substance and Sexual Activity   Alcohol use: Not Currently    Alcohol/week: 4.0 standard drinks of alcohol    Types: 4 Glasses of wine per week    Comment: quit ETOH in Feb. 2023   Drug use: Not Currently    Types: Marijuana    Comment: 2 days ago   Sexual activity: Not Currently    Birth control/protection: Post-menopausal  Other Topics Concern   Not on file  Social History Narrative   Lives with husband and son    Social  Determinants of Health   Financial Resource Strain: High Risk (08/25/2022)   Overall Financial Resource Strain (CARDIA)    Difficulty of Paying Living Expenses: Hard  Food Insecurity: No Food Insecurity (09/20/2022)   Hunger Vital Sign    Worried About Running Out of Food in the Last Year: Never true    [place] in the Last Year: Never true  Recent Concern: [place] Present (08/25/2022)   Hunger Vital Sign    Worried About Running Out of Food in the Last Year: Sometimes true    Ran Out of Food  in the Last Year: Sometimes true  Transportation Needs: Unmet Transportation Needs (10/09/2022)   PRAPARE - Transportation    Lack of Transportation (Medical): Yes    Lack of Transportation (Non-Medical): Yes  Physical Activity: Insufficiently Active (08/25/2022)   Exercise Vital Sign    Days of Exercise per Week: 1 day    Minutes of Exercise per Session: 10 min  Stress: Stress Concern Present (08/25/2022)   [place]    Feeling of Stress : To some extent  Social Connections: Moderately Integrated (08/25/2022)   Social Connection and Isolation Panel [NHANES]    Frequency of Communication with Friends and Family: More than three times a week    Frequency of Social Gatherings with Friends and Family: Twice a week    Attends Religious Services: Never    Marine scientist or Organizations: No    Attends Music therapist: 1 to 4 times per year    Marital Status: Married  Human resources officer Violence: Not At Risk (09/20/2022)   Humiliation, Afraid, Rape, and Kick questionnaire    Fear of Current or Ex-Partner: No    Emotionally Abused: No    Physically Abused: No    Sexually Abused: No    FAMILY HISTORY: Family History  Problem Relation Age of Onset   Depression Mother    Anxiety disorder Mother    Diabetes Mother    Hypertension Mother    Hyperlipidemia Mother    Cancer Mother     Uterine cancer Mother 25   Cervical cancer Mother 13   Colon cancer Father    Depression Brother    Anxiety disorder Brother    Cancer Maternal Aunt        unk types   Diabetes Mellitus II Maternal Grandmother    Hypercholesterolemia Maternal Grandmother    Breast cancer Maternal Grandmother    Cancer Paternal Grandmother    Diabetes Paternal Grandmother    Melanoma Paternal Grandmother    Stomach cancer Paternal Grandmother     ALLERGIES:  is allergic to metformin and related, nsaids, perphenazine, sulfa antibiotics, abilify [aripiprazole], and penicillins.  MEDICATIONS:  Current Outpatient Medications  Medication Sig Dispense Refill   acetaminophen (TYLENOL) 325 MG tablet Take 650 mg by mouth every 6 (six) hours as needed for headache (pain).     albuterol (PROVENTIL) (2.5 MG/3ML) 0.083% nebulizer solution Take 3 mLs (2.5 mg total) by nebulization every 6 (six) hours as needed for shortness of breath or wheezing. 75 mL 0   albuterol (VENTOLIN HFA) 108 (90 Base) MCG/ACT inhaler Inhale 2 puffs into the lungs every 6 (six) hours as needed for wheezing or shortness of breath. 1 each 5   amantadine (SYMMETREL) 100 MG capsule Take 100 mg by mouth 2 (two) times daily.     blood glucose meter kit and supplies Dispense based on patient and insurance preference. Use up to four times daily as directed. (FOR ICD-10 E10.9, E11.9). 1 each 0   Budeson-Glycopyrrol-Formoterol (BREZTRI AEROSPHERE) 160-9-4.8 MCG/ACT AERO Inhale 2 puffs into the lungs in the morning and at bedtime. 5.9 g 11   buPROPion (WELLBUTRIN XL) 300 MG 24 hr tablet Take 300 mg by mouth daily.     Cholecalciferol (VITAMIN D-3) 125 MCG (5000 UT) TABS Take 5,000 Units by mouth daily. 30 tablet 1   diltiazem (CARDIZEM CD) 180 MG 24 hr capsule Take 1 capsule (180 mg total) by mouth daily. 30 capsule 0   docusate sodium (  COLACE) 100 MG capsule TAKE 1 CAPSULE BY MOUTH TWICE DAILY 60 capsule 1   FARXIGA 10 MG TABS tablet TAKE 1 TABLET BY  MOUTH DAILY BEFORE BREAKFAST. 30 tablet 1   gabapentin (NEURONTIN) 300 MG capsule Take 300 mg by mouth 2 (two) times daily.     hydrocortisone cream 0.5 % Apply 1 Application topically 2 (two) times daily as needed for itching. 30 g 1   Hydrocortisone, Perianal, 1 % CREA Apply topically.     hydrOXYzine (VISTARIL) 50 MG capsule Take 50 mg by mouth 3 (three) times daily as needed for anxiety.     icosapent Ethyl (VASCEPA) 1 g capsule TAKE 2 CAPSULES BY MOUTH 2 TIMES DAILY 120 capsule 3   insulin detemir (LEVEMIR FLEXPEN) 100 UNIT/ML FlexPen Inject 4-10 Units into the skin daily. 15 mL 0   Insulin Pen Needle 32G X 4 MM MISC 1 each by Does not apply route daily at 12 noon. 100 each 0   levothyroxine (SYNTHROID) 75 MCG tablet TAKE 1 TABLET BY MOUTH DAILY BEFORE BREAKFAST 90 tablet 2   loratadine (CLARITIN) 10 MG tablet Take 10 mg by mouth every morning.     losartan (COZAAR) 25 MG tablet Take 1 tablet (25 mg total) by mouth at bedtime. 30 tablet 3   nicotine (NICODERM CQ - DOSED IN MG/24 HOURS) 21 mg/24hr patch Aplly 61m patch chest wall daily (okay to substitute generic) 28 patch 0   nicotine polacrilex (NICORETTE) 2 MG gum Take 1 each (2 mg total) by mouth every 4 (four) hours while awake. 150 tablet 0   NON FORMULARY Pt uses a cpap nightly     paliperidone (INVEGA SUSTENNA) 234 MG/1.5ML SUSY injection Inject 234 mg into the muscle every 30 (thirty) days. On or about the 14th of each month     pantoprazole (PROTONIX) 40 MG tablet TAKE 1 TABLET BY MOUTH DAILY 90 tablet 2   Pediatric Multivit-Minerals-C (CHEWABLES MULTIVITAMIN PO) Take 1 tablet by mouth daily.     predniSONE (DELTASONE) 50 MG tablet 1 tablet daily for next 3 days 3 tablet 0   rosuvastatin (CRESTOR) 40 MG tablet Take 1 tablet (40 mg total) by mouth every morning. 30 tablet 3   sertraline (ZOLOFT) 100 MG tablet Take 200 mg by mouth every morning.     Spacer/Aero-Holding Chambers (AEROCHAMBER MV) inhaler Use as instructed 1 each 0    pioglitazone (ACTOS) 15 MG tablet Take 15 mg by mouth daily. (Patient not taking: Reported on 10/10/2022)     No current facility-administered medications for this visit.   Facility-Administered Medications Ordered in Other Visits  Medication Dose Route Frequency Provider Last Rate Last Admin   fluorouracil (ADRUCIL) 3,800 mg in sodium chloride 0.9 % 74 mL chemo infusion  2,400 mg/m2 (Treatment Plan Recorded) Intravenous 1 day or 1 dose YEarlie Server MD       leucovorin 650 mg in dextrose 5 % 250 mL infusion  650 mg Intravenous Once YEarlie Server MD       oxaliplatin (ELOXATIN) 120 mg in dextrose 5 % 500 mL chemo infusion  75 mg/m2 (Treatment Plan Recorded) Intravenous Once YEarlie Server MD         PHYSICAL EXAMINATION: ECOG PERFORMANCE STATUS: 0 - Asymptomatic Vitals:   10/10/22 0911  BP: 95/68  Pulse: 80  Resp: 18  SpO2: 96%    Filed Weights   10/10/22 0904  Weight: 134 lb (60.8 kg)     Physical Exam Constitutional:  General: She is not in acute distress. HENT:     Head: Normocephalic and atraumatic.  Eyes:     General: No scleral icterus. Cardiovascular:     Rate and Rhythm: Normal rate and regular rhythm.     Heart sounds: Normal heart sounds.  Pulmonary:     Effort: Pulmonary effort is normal. No respiratory distress.     Breath sounds: Wheezing present.     Comments: Decreased breath sound bilaterally Abdominal:     General: Bowel sounds are normal. There is no distension.     Palpations: Abdomen is soft.     Comments: Liquid stool in ostomy bag.  Musculoskeletal:        General: No deformity. Normal range of motion.     Cervical back: Normal range of motion and neck supple.  Skin:    General: Skin is warm and dry.     Findings: No erythema or rash.  Neurological:     Mental Status: She is alert and oriented to person, place, and time. Mental status is at baseline.     Cranial Nerves: No cranial nerve deficit.     Coordination: Coordination normal.  Psychiatric:         Mood and Affect: Mood normal.     LABORATORY DATA:  I have reviewed the data as listed    Latest Ref Rng & Units 10/10/2022    8:56 AM 10/05/2022   10:54 AM 09/29/2022    5:20 AM  CBC  WBC 4.0 - 10.5 K/uL 9.7  12.1  13.7   Hemoglobin 12.0 - 15.0 g/dL 12.7  13.5  13.9   Hematocrit 36.0 - 46.0 % 36.6  39.5  41.2   Platelets 150 - 400 K/uL 134  138  123       Latest Ref Rng & Units 10/10/2022    8:56 AM 10/05/2022   10:54 AM 09/29/2022    5:20 AM  CMP  Glucose 70 - 99 mg/dL 147  227    BUN 6 - 20 mg/dL 16  25    Creatinine 0.44 - 1.00 mg/dL 1.02  1.08  0.98   Sodium 135 - 145 mmol/L 138  139    Potassium 3.5 - 5.1 mmol/L 3.6  4.1    Chloride 98 - 111 mmol/L 103  101    CO2 22 - 32 mmol/L 27  25    Calcium 8.9 - 10.3 mg/dL 9.1  9.8    Total Protein 6.5 - 8.1 g/dL 6.9     Total Bilirubin 0.3 - 1.2 mg/dL 0.6     Alkaline Phos 38 - 126 U/L 78     AST 15 - 41 U/L 33     ALT 0 - 44 U/L 30           RADIOGRAPHIC STUDIES: I have personally reviewed the radiological images as listed and agreed with the findings in the report. CT Angio Chest PE W and/or Wo Contrast  Result Date: 09/28/2022 CLINICAL DATA:  Weakness, history of rectal carcinoma EXAM: CT ANGIOGRAPHY CHEST WITH CONTRAST TECHNIQUE: Multidetector CT imaging of the chest was performed using the standard protocol during bolus administration of intravenous contrast. Multiplanar CT image reconstructions and MIPs were obtained to evaluate the vascular anatomy. RADIATION DOSE REDUCTION: This exam was performed according to the departmental dose-optimization program which includes automated exposure control, adjustment of the mA and/or kV according to patient size and/or use of iterative reconstruction technique. CONTRAST:  31m OMNIPAQUE IOHEXOL 350 MG/ML  SOLN COMPARISON:  09/20/2022 FINDINGS: Cardiovascular: Right IJ port catheter to the cavoatrial junction. Heart size normal. Trace pericardial fluid. The RV is nondilated.  Satisfactory opacification of pulmonary arteries noted, and there is no evidence of pulmonary emboli. Adequate contrast opacification of the thoracic aorta with no evidence of dissection, aneurysm, or stenosis. There is classic 3-vessel brachiocephalic arch anatomy without proximal stenosis. Minimal calcified atheromatous plaque in the arch and descending thoracic aorta. Mediastinum/Nodes: No mass or adenopathy. Lungs/Pleura: No pleural effusion. No pneumothorax. Scattered ground-glass infiltrate in the anterior segments of both upper lobes, new since previous. Upper Abdomen: No acute findings. Musculoskeletal: No chest wall abnormality. No acute or significant osseous findings. Review of the MIP images confirms the above findings. IMPRESSION: 1. Negative for acute PE or thoracic aortic dissection. 2. New ground-glass infiltrates anteriorly in bilateral upper lobes, may represent atypical edema, infectious or inflammatory process. 3.  Aortic Atherosclerosis (ICD10-170.0). Electronically Signed   By: Lucrezia Europe M.D.   On: 09/28/2022 20:55   CT Head Wo Contrast  Result Date: 09/28/2022 CLINICAL DATA:  Worsening headaches and syncopal episode, initial encounter EXAM: CT HEAD WITHOUT CONTRAST TECHNIQUE: Contiguous axial images were obtained from the base of the skull through the vertex without intravenous contrast. RADIATION DOSE REDUCTION: This exam was performed according to the departmental dose-optimization program which includes automated exposure control, adjustment of the mA and/or kV according to patient size and/or use of iterative reconstruction technique. COMPARISON:  02/02/2017 FINDINGS: Brain: No evidence of acute infarction, hemorrhage, hydrocephalus, extra-axial collection or mass lesion/mass effect. Vascular: No hyperdense vessel or unexpected calcification. Skull: Normal. Negative for fracture or focal lesion. Sinuses/Orbits: No acute finding. Other: None. IMPRESSION: No acute intracranial  abnormality noted. Electronically Signed   By: Inez Catalina M.D.   On: 09/28/2022 19:30   DG Chest 2 View  Result Date: 09/28/2022 CLINICAL DATA:  Weakness EXAM: CHEST - 2 VIEW COMPARISON:  Chest x-ray 06/14/2018 FINDINGS: Right chest port catheter tip projects over the SVC, unchanged. The heart size and mediastinal contours are within normal limits. Both lungs are clear. The visualized skeletal structures are unremarkable. IMPRESSION: No active cardiopulmonary disease. Electronically Signed   By: Ronney Asters M.D.   On: 09/28/2022 18:12   CT Angio Chest PE W and/or Wo Contrast  Result Date: 09/20/2022 CLINICAL DATA:  Shortness of breath, hypoxia EXAM: CT ANGIOGRAPHY CHEST WITH CONTRAST TECHNIQUE: Multidetector CT imaging of the chest was performed using the standard protocol during bolus administration of intravenous contrast. Multiplanar CT image reconstructions and MIPs were obtained to evaluate the vascular anatomy. RADIATION DOSE REDUCTION: This exam was performed according to the departmental dose-optimization program which includes automated exposure control, adjustment of the mA and/or kV according to patient size and/or use of iterative reconstruction technique. CONTRAST:  40m OMNIPAQUE IOHEXOL 350 MG/ML SOLN COMPARISON:  Previous CT done on 03/02/2022 and chest radiograph done today FINDINGS: Cardiovascular: There is homogeneous enhancement in thoracic aorta. There are scattered calcifications and atherosclerotic plaques in thoracic aorta. There are no intraluminal filling defects in pulmonary artery branches. Mediastinum/Nodes: No significant lymphadenopathy seen. Lungs/Pleura: Small linear patchy density is seen in the anteromedial aspect of right mid lung field suggesting atelectasis/pneumonia in right middle lobe. There are faint ground-glass densities in posterior right lower lung field. There is no pleural effusion or pneumothorax. Upper Abdomen: No acute findings are seen. Musculoskeletal:  Unremarkable. Review of the MIP images confirms the above findings. IMPRESSION: There is no evidence of pulmonary artery embolism. There is no  evidence of thoracic aortic dissection. Small linear patchy alveolar infiltrate is seen in medial segment of right middle lobe suggesting atelectasis/pneumonia. Follow-up studies should be considered to assess resolution. Electronically Signed   By: Elmer Picker M.D.   On: 09/20/2022 11:47   DG Chest 2 View  Result Date: 09/20/2022 CLINICAL DATA:  Shortness of breath. EXAM: CHEST - 2 VIEW COMPARISON:  July 29, 2021. FINDINGS: The heart size and mediastinal contours are within normal limits. Both lungs are clear. Right internal jugular Port-A-Cath is noted with distal tip in expected position of the SVC. The visualized skeletal structures are unremarkable. IMPRESSION: No active cardiopulmonary disease. Electronically Signed   By: Marijo Conception M.D.   On: 09/20/2022 10:21

## 2022-10-10 NOTE — Patient Instructions (Signed)
[facility] AT [place]  Discharge Instructions: Thank you for choosing [facility] to provide your oncology and hematology care.  If you have a lab appointment with the [facility], please go directly to the [facility] and check in at the registration area.  Wear comfortable clothing and clothing appropriate for easy access to any Portacath or PICC line.   We strive to give you quality time with your provider. You may need to reschedule your appointment if you arrive late (15 or more minutes).  Arriving late affects you and other patients whose appointments are after yours.  Also, if you miss three or more appointments without notifying the office, you may be dismissed from the clinic at the provider's discretion.      For prescription refill requests, have your pharmacy contact our office and allow 72 hours for refills to be completed.    Today you received the following chemotherapy and/or immunotherapy agents oxaliplatin/leucovorin/florouracil.    To help prevent nausea and vomiting after your treatment, we encourage you to take your nausea medication as directed.  BELOW ARE SYMPTOMS THAT SHOULD BE REPORTED IMMEDIATELY: *FEVER GREATER THAN 100.4 F (38 C) OR HIGHER *CHILLS OR SWEATING *NAUSEA AND VOMITING THAT IS NOT CONTROLLED WITH YOUR NAUSEA MEDICATION *UNUSUAL SHORTNESS OF BREATH *UNUSUAL BRUISING OR BLEEDING *URINARY PROBLEMS (pain or burning when urinating, or frequent urination) *BOWEL PROBLEMS (unusual diarrhea, constipation, pain near the anus) TENDERNESS IN MOUTH AND THROAT WITH OR WITHOUT PRESENCE OF ULCERS (sore throat, sores in mouth, or a toothache) UNUSUAL RASH, SWELLING OR PAIN  UNUSUAL VAGINAL DISCHARGE OR ITCHING   Items with * indicate a potential emergency and should be followed up as soon as possible or go to the Emergency Department if any problems should occur.  Please show the CHEMOTHERAPY ALERT CARD or IMMUNOTHERAPY  ALERT CARD at check-in to the Emergency Department and triage nurse.  Should you have questions after your visit or need to cancel or reschedule your appointment, please contact [facility] CANC[facility] [place] AT [place]  [phone] and follow the prompts.  Office hours are 8:00 a.m. to 4:30 p.m. Monday - Friday. Please note that voicemails left after 4:00 p.m. may not be returned until the following business day.  We are closed weekends and major holidays. You have access to a nurse at all times for urgent questions. Please call the main number to the clinic [phone] and follow the prompts.  For any non-urgent questions, you may also contact your provider using MyChart. We now offer e-Visits for anyone 51 and older to request care online for non-urgent symptoms. For details visit [email or web address].   Also download the MyChart app! Go to the app store, search "MyChart", open the app, select [facility], and log in with your MyChart username and password.  Masks are optional in the cancer centers. If you would like for your care team to wear a mask while they are taking care of you, please let them know. For doctor visits, patients may have with them one support person who is at least 50 years old. At this time, visitors are not allowed in the infusion area.

## 2022-10-10 NOTE — Assessment & Plan Note (Signed)
Recently quitted.  Encouraged her cessation effort.   

## 2022-10-10 NOTE — Assessment & Plan Note (Signed)
Trending low, close monitor. 

## 2022-10-11 ENCOUNTER — Ambulatory Visit (INDEPENDENT_AMBULATORY_CARE_PROVIDER_SITE_OTHER): Payer: Medicare Other | Admitting: Student in an Organized Health Care Education/Training Program

## 2022-10-11 ENCOUNTER — Other Ambulatory Visit: Payer: Self-pay | Admitting: Family Medicine

## 2022-10-11 ENCOUNTER — Encounter: Payer: Self-pay | Admitting: Student in an Organized Health Care Education/Training Program

## 2022-10-11 VITALS — BP 120/70 | HR 89 | Temp 98.5°F | Ht 59.0 in | Wt 132.2 lb

## 2022-10-11 DIAGNOSIS — R0602 Shortness of breath: Secondary | ICD-10-CM | POA: Diagnosis not present

## 2022-10-11 DIAGNOSIS — F17210 Nicotine dependence, cigarettes, uncomplicated: Secondary | ICD-10-CM | POA: Diagnosis not present

## 2022-10-11 LAB — NITRIC OXIDE: Nitric Oxide: 5

## 2022-10-11 MED ORDER — IPRATROPIUM-ALBUTEROL 0.5-2.5 (3) MG/3ML IN SOLN: 3.0000 mL | Freq: Four times a day (QID) | RESPIRATORY_TRACT | 11 refills | Status: DC | PRN

## 2022-10-11 MED ORDER — NICOTINE POLACRILEX 2 MG MT LOZG: 2.0000 mg | LOZENGE | OROMUCOSAL | 3 refills | Status: AC | PRN

## 2022-10-11 NOTE — Patient Instructions (Signed)
The [place] Quitline: Call 1-800-QUIT-NOW (1-800-784-8669). The [place] Quitline is a free service for [place] residents. Trained counselors are available from 8 am until 3 am, 365 days per year. Services are available in both English and Spanish.   Web Resources Free online support programs can help you track your progress and share experiences with others who are quitting. These are examples: www.becomeanex.org www.trytostop.org  www.smokefree.gov  www.everydayhealth.com/smoking-cessation/index.aspx  [facility]: offers comprehensive in-person tobacco treatment counseling at [facility] building ([address] [place] [address]).  Open to everyone. Virtual appointments available. Free parking. Call [phone] to schedule an appointment or [phone] for general information.    Tobacco Cessation Medications  Nicotine Replacement Therapy (NRT)  Nicotine is the addictive part of tobacco smoke, but not the most dangerous part. There are 7000 other toxins in cigarettes, including carbon monoxide, that cause disease. People do not generally become addicted to medication. Common problems: People don't use enough medication or stop too early. Medications are safe and effective. Overdose is very uncommon. Use medications as long as needed (3 months minimum). Some combinations work better than single medications. Long acting medications like the NRT patch and bupropion provide continuous treatment for withdrawal symptoms.  PLUS  Short acting medications like the NRT gum, lozenge, inhaler, and nasal spray help people to cope with breakthrough cravings.  ? Nicotine Patch  Place patch on hairless skin on upper body, including arms and back. Each day: discard old patch, shower, apply new patch to a different site. Apply hydrocortisone cream to mildly red/irritated areas. Call provider if rash develops. If patch causes sleep disturbance, remove patch  at bedtime and replace each morning after shower. Side effects may include: skin irritation, headache, insomnia, abnormal/vivid dreams.  ? Nicotine Gum  Chew gum slowly, park in cheek when peppery taste or tingling sensation begins (about 15-30 chews). When taste or tingling goes away, begin chewing again. Use until nicotine is gone (taste or tingle does not return, usually 30 minutes). Park in different areas of mouth. Nicotine is absorbed through the lining of the mouth. Use enough to control cravings, up to 24 pieces per day (if used alone). Avoid eating or drinking for 15 minutes before using and during use. Side effects may include: mouth/jaw soreness, hiccups, indigestion, hypersalivation.  If gum is not chewed correctly, additional side effects may include lightheadedness, nausea/vomiting, throat and mouth irritation.  ? Nicotine Lozenge  Allow to dissolve slowly in mouth (20-30 minutes). Do not chew or swallow. Nicotine release may cause a warm tingling sensation. Occasionally rotate to different areas of the mouth. Use enough to control cravings, up to 20 lozenges per day (if used alone). Avoid eating or drinking for 15 minutes before using and during use. Side effects may include: nausea, hiccups, cough, heartburn, headache, gas, insomnia.  ? Nicotine Nasal Spray Use 1 spray in each nostril (1 dose) and tilt head back for 1 minute. Do not sniff, swallow, or inhale through nose.  Use at least 8 doses (1 spray in each nostril) , up to 40 doses per day (if used alone). To reduce nasal irritation, spray on cotton swab and insert into nose. Side effects may include: nasal and/or throat irritation (hot, peppery, or burning sensation), nasal irritation, tearing, sneezing, cough, headache.  ? Nicotine Oral Inhaler (puffer) Inhale into the back of the throat or puff in short breaths. Do not inhale into the lungs.  Puff continuously for 20 minutes (about 80 puffs) until cartridge   is  empty. Change cartridge when it loses the "burning in throat" sensation (feels like air only). Open cartridges can be saved and used again within 24 hours. Use at least 6 and up to 16 cartridges per day (if used alone).  Avoid eating or drinking for 15 minutes before using and during use. Side effects may include: mouth and/or throat irritation, unpleasant taste, cough, nasal irritation, indigestion, hiccups, headache.  ? Chantix (varenicline) Days 1-3: Take one 0.5 mg white pill each morning for 3 days, one week before quit date. Days 4-7: Increase to one 0.5 mg white pill twice a day in morning and evening for 4 days.  On Day 8 (target quit date), increase to one 1 mg blue pill twice a day. Maintain this dose for a minimum of 3 months. Take with food and a full glass of water to reduce nausea. Be sure that the two doses are at least 8 hours apart, but try to take second dose early in the evening (i.e. 6 pm) to avoid sleep problems. Common side effects include: nausea, insomnia, headache, abnormal/vivid dreams. Tell your doctor if you have any history of psychiatric illness prior to starting Chantix.  STOP taking CHANTIX and contact a healthcare provider immediately if you experience agitation, hostility, depressed mood, changes in thoughts or behavior that are not typical for you, thinking about or attempting suicide, allergic or skin reactions including swelling, rash, redness, or peeling of the skin.  For patients who have heart disease: Smoking is a major risk factor for cardiovascular disease, and Chantix can help you quit smoking. Chantix may be associated with a small, increased risk of certain heart events in patients who have heart disease. If you have any new or worsening symptoms of heart disease while taking Chantix, such as shortness of breath or trouble breathing, new or worsening chest pain, or new or worsening pain in your legs when walking, call your doctor or get emergency medical  help immediately.  ? Wellbutrin / Zyban (bupropion) Take one 150 mg pill each morning for 3 days, one week before target quit date. On Day 4, increase to one 150 mg pill twice a day, morning and evening.  Maintain this dose for a minimum of 3 months. Be sure that the two doses are at least 8 hours apart, but try to take second dose early in the evening (i.e. 6 pm) to avoid sleep problems. Avoid or minimize use of alcohol when taking this medication. Common side effects include: dry mouth, headache, insomnia, nausea, weight loss.  Risk of seizure is 12/998. STOP taking BUPROPION and contact a healthcare provider immediately if you experience agitation, hostility, depressed mood, changes in thoughts or behavior that are not typical for you, thinking about or attempting suicide, allergic or skin reactions including swelling, rash, redness, or peeling of the skin.  

## 2022-10-11 NOTE — Progress Notes (Signed)
Synopsis: Post hospital discharge follow up.  Assessment & Plan:   #Shortness of breath  Presumptive COPD based on symptoms and history of smoking. Had been prescribed Breztri by Dr. Patsey Berthold in the past. Counseled her on the importance of compliance and the need to fully abstain from smoking. Performed FENO level today as my suspicion for asthma is moderate, returned low at 5 ppb (albeit could be confounded by recent steroid administration). Historically, she has had eosinophil levels of 600 back in 2015, keeping asthma on the differential. On exam, she has some scattered wheezing and rhonchi. Will also prescribe duonebs to be used PRN as the patient felt improvement with them. Today, I will order PFT's to assess her lung function (sprimetry, lung volumes, and DLCO). Finally, reviewed her chest CT's and there is no suspicion for pneumotoxicity from her chemotherapy - the patchy mosaicism noted is likely due to atelectasis as well as air trapping from her obstructive lung disease. My suspicion for bronchiolitis obliterans is similarly low.  - Pulmonary Function Test [facility] Only; Future - Nitric oxide - ipratropium-albuterol (DUONEB) 0.5-2.5 (3) MG/3ML SOLN; Take 3 mLs by nebulization every 6 (six) hours as needed.  Dispense: 360 mL; Refill: 11  #Tobacco dependence due to cigarettes  Continue with nicotine patches, will switch gum to lozenges.  - nicotine polacrilex (NICOTINE MINI) 2 MG lozenge; Take 1 lozenge (2 mg total) by mouth every 2 (two) hours as needed for smoking cessation.  Dispense: 72 lozenge; Refill: 3  Return in about 3 months (around 01/11/2023).  I spent 30 minutes caring for this patient today, including preparing to see the patient, obtaining and/or reviewing separately obtained history, performing a medically appropriate examination and/or evaluation, counseling and educating the patient/family/caregiver, ordering medications, tests, or procedures, and documenting clinical  information in the electronic health record  April Reichert, MD [facility] 10/11/2022 8:48 AM    End of visit medications:  Meds ordered this encounter  Medications   nicotine polacrilex (NICOTINE MINI) 2 MG lozenge    Sig: Take 1 lozenge (2 mg total) by mouth every 2 (two) hours as needed for smoking cessation.    Dispense:  72 lozenge    Refill:  3   ipratropium-albuterol (DUONEB) 0.5-2.5 (3) MG/3ML SOLN    Sig: Take 3 mLs by nebulization every 6 (six) hours as needed.    Dispense:  360 mL    Refill:  11     Current Outpatient Medications:    acetaminophen (TYLENOL) 325 MG tablet, Take 650 mg by mouth every 6 (six) hours as needed for headache (pain)., Disp: , Rfl:    albuterol (PROVENTIL) (2.5 MG/3ML) 0.083% nebulizer solution, Take 3 mLs (2.5 mg total) by nebulization every 6 (six) hours as needed for shortness of breath or wheezing., Disp: 75 mL, Rfl: 0   albuterol (VENTOLIN HFA) 108 (90 Base) MCG/ACT inhaler, Inhale 2 puffs into the lungs every 6 (six) hours as needed for wheezing or shortness of breath., Disp: 1 each, Rfl: 5   amantadine (SYMMETREL) 100 MG capsule, Take 100 mg by mouth 2 (two) times daily., Disp: , Rfl:    blood glucose meter kit and supplies, Dispense based on patient and insurance preference. Use up to four times daily as directed. (FOR ICD-10 E10.9, E11.9)., Disp: 1 each, Rfl: 0   Budeson-Glycopyrrol-Formoterol (BREZTRI AEROSPHERE) 160-9-4.8 MCG/ACT AERO, Inhale 2 puffs into the lungs in the morning and at bedtime., Disp: 5.9 g, Rfl: 11   buPROPion (WELLBUTRIN XL) 300  MG 24 hr tablet, Take 300 mg by mouth daily., Disp: , Rfl:    Cholecalciferol (VITAMIN D-3) 125 MCG (5000 UT) TABS, Take 5,000 Units by mouth daily., Disp: 30 tablet, Rfl: 1   diltiazem (CARDIZEM CD) 180 MG 24 hr capsule, Take 1 capsule (180 mg total) by mouth daily., Disp: 30 capsule, Rfl: 0   docusate sodium (COLACE) 100 MG capsule, TAKE 1 CAPSULE BY MOUTH TWICE DAILY,  Disp: 60 capsule, Rfl: 1   FARXIGA 10 MG TABS tablet, TAKE 1 TABLET BY MOUTH DAILY BEFORE BREAKFAST., Disp: 30 tablet, Rfl: 1   gabapentin (NEURONTIN) 300 MG capsule, Take 300 mg by mouth 2 (two) times daily., Disp: , Rfl:    hydrocortisone cream 0.5 %, Apply 1 Application topically 2 (two) times daily as needed for itching., Disp: 30 g, Rfl: 1   Hydrocortisone, Perianal, 1 % CREA, Apply topically., Disp: , Rfl:    hydrOXYzine (VISTARIL) 50 MG capsule, Take 50 mg by mouth 3 (three) times daily as needed for anxiety., Disp: , Rfl:    icosapent Ethyl (VASCEPA) 1 g capsule, TAKE 2 CAPSULES BY MOUTH 2 TIMES DAILY, Disp: 120 capsule, Rfl: 3   insulin detemir (LEVEMIR FLEXPEN) 100 UNIT/ML FlexPen, Inject 4-10 Units into the skin daily., Disp: 15 mL, Rfl: 0   Insulin Pen Needle 32G X 4 MM MISC, 1 each by Does not apply route daily at 12 noon., Disp: 100 each, Rfl: 0   ipratropium-albuterol (DUONEB) 0.5-2.5 (3) MG/3ML SOLN, Take 3 mLs by nebulization every 6 (six) hours as needed., Disp: 360 mL, Rfl: 11   levothyroxine (SYNTHROID) 75 MCG tablet, TAKE 1 TABLET BY MOUTH DAILY BEFORE BREAKFAST, Disp: 90 tablet, Rfl: 2   loratadine (CLARITIN) 10 MG tablet, Take 10 mg by mouth every morning., Disp: , Rfl:    losartan (COZAAR) 25 MG tablet, Take 1 tablet (25 mg total) by mouth at bedtime., Disp: 30 tablet, Rfl: 3   nicotine (NICODERM CQ - DOSED IN MG/24 HOURS) 21 mg/24hr patch, Aplly 31m patch chest wall daily (okay to substitute generic), Disp: 28 patch, Rfl: 0   nicotine polacrilex (NICORETTE) 2 MG gum, Take 1 each (2 mg total) by mouth every 4 (four) hours while awake., Disp: 150 tablet, Rfl: 0   nicotine polacrilex (NICOTINE MINI) 2 MG lozenge, Take 1 lozenge (2 mg total) by mouth every 2 (two) hours as needed for smoking cessation., Disp: 72 lozenge, Rfl: 3   NON FORMULARY, Pt uses a cpap nightly, Disp: , Rfl:    paliperidone (INVEGA SUSTENNA) 234 MG/1.5ML SUSY injection, Inject 234 mg into the muscle every  30 (thirty) days. On or about the 14th of each month, Disp: , Rfl:    pantoprazole (PROTONIX) 40 MG tablet, TAKE 1 TABLET BY MOUTH DAILY, Disp: 90 tablet, Rfl: 2   Pediatric Multivit-Minerals-C (CHEWABLES MULTIVITAMIN PO), Take 1 tablet by mouth daily., Disp: , Rfl:    rosuvastatin (CRESTOR) 40 MG tablet, Take 1 tablet (40 mg total) by mouth every morning., Disp: 30 tablet, Rfl: 3   sertraline (ZOLOFT) 100 MG tablet, Take 200 mg by mouth every morning., Disp: , Rfl:    Spacer/Aero-Holding Chambers (AEROCHAMBER MV) inhaler, Use as instructed, Disp: 1 each, Rfl: 0   pioglitazone (ACTOS) 15 MG tablet, Take 15 mg by mouth daily. (Patient not taking: Reported on 10/11/2022), Disp: , Rfl:    Subjective:   PATIENT ID: SClayton LefortGENDER: female DOB: 105-Nov-1972 MRN: [id number] Chief Complaint  Patient presents with  Hospitalization Follow-up    Breathing has improved since discharge. C/O sob with exertion, wheezing, chest congestion and dry cough.     HPI  April Luna is a 51 year old patient of Dr. Patsey Berthold who is presenting to clinic for post hospital discharge follow up. She was most recently admitted 9/27-10/1 and 10/5-10/7 for the management of a COPD exacerbation.  Today, she reports feeling much better. The symptoms resulting in her admission included shortness of breath, wheezing, cough, and low oxygen saturation. During her stay at the hospital, workup included viral panels and a pro-cal, both of which returning negative. She was given antibiotics, steroids, and nebulizers with improvement. She was counseled extensively on the importance of smoking cessation. Her past medical history is notable for RCC for which she is on treatment with FOLFOX and Capecitabine. Given hypoxia and concern for PE, she had multiple CT scans of the chest with PE protocol that were negative for VTE. She reports that she continues to smoke a few cigarettes a day, which is improved from prior. She has a  patch and is using the nicotine gum with some improvement. She has a cat at home but no other pets. No report of mold at home. She did not have asthma growing up. No PFT's are on record.  Ancillary information including prior medications, full medical/surgical/family/social histories, and PFTs (when available) are listed below and have been reviewed.   Review of Systems  Constitutional:  Negative for chills and fever.  Respiratory:  Positive for cough, shortness of breath and wheezing. Negative for sputum production.   Cardiovascular:  Negative for chest pain and palpitations.     Objective:   Vitals:   10/11/22 0831  BP: 120/70  Pulse: 89  Temp: 98.5 F (36.9 C)  TempSrc: Temporal  SpO2: 94%  Weight: 132 lb 3.2 oz (60 kg)  Height: _0  (1.499 m)   94% on RA BMI Readings from Last 3 Encounters:  10/11/22 26.70 kg/m  10/10/22 27.06 kg/m  10/09/22 27.08 kg/m   Wt Readings from Last 3 Encounters:  10/11/22 132 lb 3.2 oz (60 kg)  10/10/22 134 lb (60.8 kg)  10/09/22 134 lb 1.6 oz (60.8 kg)    Physical Exam Vitals reviewed.  Constitutional:      Appearance: Normal appearance.  HENT:     Head: Normocephalic.     Mouth/Throat:     Mouth: Mucous membranes are moist.  Cardiovascular:     Rate and Rhythm: Normal rate and regular rhythm.     Heart sounds: Normal heart sounds.  Pulmonary:     Breath sounds: Wheezing and rhonchi present. No rales.  Abdominal:     Palpations: Abdomen is soft.  Skin:    General: Skin is warm.  Neurological:     General: No focal deficit present.     Mental Status: She is alert and oriented to person, place, and time.       Ancillary Information    Past Medical History:  Diagnosis Date   Allergy    pollen   Anxiety    Arthritis    right hip   Bipolar 1 disorder (HCC)    Cancer (HCC)    rectal   Chronic kidney disease    COPD (chronic obstructive pulmonary disease) (HCC)    Depression    Family history of adverse  reaction to anesthesia    grand father had a stroke during anesthesia   Family history of breast cancer    Family  history of colon cancer    Family history of uterine cancer    GERD (gastroesophageal reflux disease)    History of kidney stones    Hyperlipidemia    Hypertension    Hypothyroidism    Panic attack    Pneumonia    Psoriasis    Sleep apnea 08/11/2021   No CPAP   Type 2 diabetes mellitus with microalbuminuria, without long-term current use of insulin ([place]) 06/24/2019     Family History  Problem Relation Age of Onset   Depression Mother    Anxiety disorder Mother    Diabetes Mother    Hypertension Mother    Hyperlipidemia Mother    Cancer Mother    Uterine cancer Mother 62   Cervical cancer Mother 35   Colon cancer Father    Depression Brother    Anxiety disorder Brother    Cancer Maternal Aunt        unk types   Diabetes Mellitus II Maternal Grandmother    Hypercholesterolemia Maternal Grandmother    Breast cancer Maternal Grandmother    Cancer Paternal Grandmother    Diabetes Paternal Grandmother    Melanoma Paternal Grandmother    Stomach cancer Paternal Grandmother      Past Surgical History:  Procedure Laterality Date   BREAST BIOPSY Left 12/14/2021   [place] bx, venus marker, path pending   CESAREAN SECTION     COLONOSCOPY WITH PROPOFOL N/A 02/09/2022   Procedure: COLONOSCOPY WITH PROPOFOL;  Surgeon: Lin Landsman, MD;  Location: [place];  Service: Endoscopy;  Laterality: N/A;  sleep apnea   CYSTOSCOPY W/ RETROGRADES Left 11/08/2018   Procedure: CYSTOSCOPY WITH RETROGRADE PYELOGRAM;  Surgeon: Billey Co, MD;  Location: [facility];  Service: Urology;  Laterality: Left;   CYSTOSCOPY/URETEROSCOPY/HOLMIUM LASER/STENT PLACEMENT Left 11/08/2018   Procedure: CYSTOSCOPY/URETEROSCOPY/HOLMIUM LASER/STENT PLACEMENT;  Surgeon: Billey Co, MD;  Location: [facility];  Service: Urology;  Laterality: Left;   IR IMAGING GUIDED PORT INSERTION   05/11/2022   MOUTH SURGERY     wisdom teeth extraction   MOUTH SURGERY     teeth removal   POLYPECTOMY N/A 02/09/2022   Procedure: POLYPECTOMY;  Surgeon: Lin Landsman, MD;  Location: [place];  Service: Endoscopy;  Laterality: N/A;   XI ROBOTIC ASSISTED LOWER ANTERIOR RESECTION N/A 04/21/2022   Procedure: XI ROBOTIC ASSISTED LOWER ANTERIOR RESECTION WITH COLOSTOMY, BILATERAL TAP BLOCK, ASSESSMENT OF TISSUE PERFUSSION WITH FIREFLY INJECTION;  Surgeon: Ileana Roup, MD;  Location: [facility];  Service: General;  Laterality: N/A;    Social History   Socioeconomic History   Marital status: Married    Spouse name: Montine Circle   Number of children: 1   Years of education: 12   Highest education level: High school graduate  Occupational History   Occupation: unemployed    Comment: disabled  Tobacco Use   Smoking status: Some Days    Packs/day: 0.25    Years: 34.00    Total pack years: 8.50    Types: Cigarettes    Start date: 05/13/1985   Smokeless tobacco: Never   Tobacco comments:    3-4 cigarettes weekly- 10/11/2022  Vaping Use   Vaping Use: Former   Start date: 05/25/2018   Quit date: 09/24/2018  Substance and Sexual Activity   Alcohol use: Not Currently    Alcohol/week: 4.0 standard drinks of alcohol    Types: 4 Glasses of wine per week    Comment: quit ETOH in Feb. 2023  Drug use: Not Currently    Types: Marijuana    Comment: 2 days ago   Sexual activity: Not Currently    Birth control/protection: Post-menopausal  Other Topics Concern   Not on file  Social History Narrative   Lives with husband and son    Social Determinants of Health   Financial Resource Strain: High Risk (08/25/2022)   Overall Financial Resource Strain (CARDIA)    Difficulty of Paying Living Expenses: Hard  Food Insecurity: No Food Insecurity (09/20/2022)   Hunger Vital Sign    Worried About Running Out of Food in the Last Year: Never true    [place] in the Last  Year: Never true  Recent Concern: Food Insecurity - Food Insecurity Present (08/25/2022)   Hunger Vital Sign    Worried About Running Out of Food in the Last Year: Sometimes true    Ran Out of Food in the Last Year: Sometimes true  Transportation Needs: Unmet Transportation Needs (10/09/2022)   PRAPARE - Hydrologist (Medical): Yes    Lack of Transportation (Non-Medical): Yes  Physical Activity: Insufficiently Active (08/25/2022)   Exercise Vital Sign    Days of Exercise per Week: 1 day    Minutes of Exercise per Session: 10 min  Stress: Stress Concern Present (08/25/2022)   [place]    Feeling of Stress : To some extent  Social Connections: Moderately Integrated (08/25/2022)   Social Connection and Isolation Panel [NHANES]    Frequency of Communication with Friends and Family: More than three times a week    Frequency of Social Gatherings with Friends and Family: Twice a week    Attends Religious Services: Never    Marine scientist or Organizations: No    Attends Archivist Meetings: 1 to 4 times per year    Marital Status: Married  Human resources officer Violence: Not At Risk (09/20/2022)   Humiliation, Afraid, Rape, and Kick questionnaire    Fear of Current or Ex-Partner: No    Emotionally Abused: No    Physically Abused: No    Sexually Abused: No     Allergies  Allergen Reactions   Metformin And Related Nausea And Vomiting   Nsaids Other (See Comments)    Stage 3 CKD   Perphenazine Other (See Comments)    Tremors, muscle weakness, tongue swelling   Sulfa Antibiotics Other (See Comments)    GI distress    Abilify [Aripiprazole] Rash   Penicillins Rash    She has taken amoxicillin without problems     CBC    Component Value Date/Time   WBC 9.7 10/10/2022 0856   RBC 3.65 (L) 10/10/2022 0856   HGB 12.7 10/10/2022 0856   HGB 13.2 04/20/2015 0640   HCT 36.6 10/10/2022  0856   HCT 39.2 04/20/2015 0640   PLT 134 (L) 10/10/2022 0856   PLT 165 04/20/2015 0640   MCV 100.3 (H) 10/10/2022 0856   MCV 96 04/20/2015 0640   MCH 34.8 (H) 10/10/2022 0856   MCHC 34.7 10/10/2022 0856   RDW 14.3 10/10/2022 0856   RDW 12.4 04/20/2015 0640   LYMPHSABS 0.7 10/10/2022 0856   LYMPHSABS 2.4 04/20/2015 0640   MONOABS 0.6 10/10/2022 0856   MONOABS 0.6 04/20/2015 0640   EOSABS 0.2 10/10/2022 0856   EOSABS 0.2 04/20/2015 0640   BASOSABS 0.0 10/10/2022 0856   BASOSABS 0.1 04/20/2015 0640    Pulmonary Functions Testing  Results:     No data to display          Outpatient Medications Prior to Visit  Medication Sig Dispense Refill   acetaminophen (TYLENOL) 325 MG tablet Take 650 mg by mouth every 6 (six) hours as needed for headache (pain).     albuterol (PROVENTIL) (2.5 MG/3ML) 0.083% nebulizer solution Take 3 mLs (2.5 mg total) by nebulization every 6 (six) hours as needed for shortness of breath or wheezing. 75 mL 0   albuterol (VENTOLIN HFA) 108 (90 Base) MCG/ACT inhaler Inhale 2 puffs into the lungs every 6 (six) hours as needed for wheezing or shortness of breath. 1 each 5   amantadine (SYMMETREL) 100 MG capsule Take 100 mg by mouth 2 (two) times daily.     blood glucose meter kit and supplies Dispense based on patient and insurance preference. Use up to four times daily as directed. (FOR ICD-10 E10.9, E11.9). 1 each 0   Budeson-Glycopyrrol-Formoterol (BREZTRI AEROSPHERE) 160-9-4.8 MCG/ACT AERO Inhale 2 puffs into the lungs in the morning and at bedtime. 5.9 g 11   buPROPion (WELLBUTRIN XL) 300 MG 24 hr tablet Take 300 mg by mouth daily.     Cholecalciferol (VITAMIN D-3) 125 MCG (5000 UT) TABS Take 5,000 Units by mouth daily. 30 tablet 1   diltiazem (CARDIZEM CD) 180 MG 24 hr capsule Take 1 capsule (180 mg total) by mouth daily. 30 capsule 0   docusate sodium (COLACE) 100 MG capsule TAKE 1 CAPSULE BY MOUTH TWICE DAILY 60 capsule 1   FARXIGA 10 MG TABS tablet TAKE 1  TABLET BY MOUTH DAILY BEFORE BREAKFAST. 30 tablet 1   gabapentin (NEURONTIN) 300 MG capsule Take 300 mg by mouth 2 (two) times daily.     hydrocortisone cream 0.5 % Apply 1 Application topically 2 (two) times daily as needed for itching. 30 g 1   Hydrocortisone, Perianal, 1 % CREA Apply topically.     hydrOXYzine (VISTARIL) 50 MG capsule Take 50 mg by mouth 3 (three) times daily as needed for anxiety.     icosapent Ethyl (VASCEPA) 1 g capsule TAKE 2 CAPSULES BY MOUTH 2 TIMES DAILY 120 capsule 3   insulin detemir (LEVEMIR FLEXPEN) 100 UNIT/ML FlexPen Inject 4-10 Units into the skin daily. 15 mL 0   Insulin Pen Needle 32G X 4 MM MISC 1 each by Does not apply route daily at 12 noon. 100 each 0   levothyroxine (SYNTHROID) 75 MCG tablet TAKE 1 TABLET BY MOUTH DAILY BEFORE BREAKFAST 90 tablet 2   loratadine (CLARITIN) 10 MG tablet Take 10 mg by mouth every morning.     losartan (COZAAR) 25 MG tablet Take 1 tablet (25 mg total) by mouth at bedtime. 30 tablet 3   nicotine (NICODERM CQ - DOSED IN MG/24 HOURS) 21 mg/24hr patch Aplly 7m patch chest wall daily (okay to substitute generic) 28 patch 0   nicotine polacrilex (NICORETTE) 2 MG gum Take 1 each (2 mg total) by mouth every 4 (four) hours while awake. 150 tablet 0   NON FORMULARY Pt uses a cpap nightly     paliperidone (INVEGA SUSTENNA) 234 MG/1.5ML SUSY injection Inject 234 mg into the muscle every 30 (thirty) days. On or about the 14th of each month     pantoprazole (PROTONIX) 40 MG tablet TAKE 1 TABLET BY MOUTH DAILY 90 tablet 2   Pediatric Multivit-Minerals-C (CHEWABLES MULTIVITAMIN PO) Take 1 tablet by mouth daily.     rosuvastatin (CRESTOR) 40 MG tablet Take 1  tablet (40 mg total) by mouth every morning. 30 tablet 3   sertraline (ZOLOFT) 100 MG tablet Take 200 mg by mouth every morning.     Spacer/Aero-Holding Chambers (AEROCHAMBER MV) inhaler Use as instructed 1 each 0   predniSONE (DELTASONE) 50 MG tablet 1 tablet daily for next 3 days 3  tablet 0   pioglitazone (ACTOS) 15 MG tablet Take 15 mg by mouth daily. (Patient not taking: Reported on 10/11/2022)     No facility-administered medications prior to visit.

## 2022-10-12 ENCOUNTER — Inpatient Hospital Stay: Payer: Medicare Other

## 2022-10-12 VITALS — BP 111/77 | HR 85 | Resp 18

## 2022-10-12 DIAGNOSIS — C20 Malignant neoplasm of rectum: Secondary | ICD-10-CM

## 2022-10-12 MED ORDER — SODIUM CHLORIDE 0.9% FLUSH
10.0000 mL | INTRAVENOUS | Status: DC | PRN
  Filled 2022-10-12: qty 10

## 2022-10-12 MED ORDER — HEPARIN SOD (PORK) LOCK FLUSH 100 UNIT/ML IV SOLN
500.0000 [IU] | Freq: Once | INTRAVENOUS | Status: DC | PRN
  Filled 2022-10-12: qty 5

## 2022-10-16 ENCOUNTER — Other Ambulatory Visit: Payer: Medicare Other

## 2022-10-16 ENCOUNTER — Ambulatory Visit: Payer: Medicare Other | Admitting: Oncology

## 2022-10-16 ENCOUNTER — Ambulatory Visit: Payer: Medicare Other

## 2022-10-17 ENCOUNTER — Other Ambulatory Visit: Payer: Medicare Other

## 2022-10-17 ENCOUNTER — Ambulatory Visit: Payer: Medicare Other

## 2022-10-17 ENCOUNTER — Ambulatory Visit: Payer: Medicare Other | Admitting: Oncology

## 2022-10-17 ENCOUNTER — Inpatient Hospital Stay: Payer: Medicare Other

## 2022-10-17 NOTE — Progress Notes (Signed)
Nutrition Follow-up:  Patient with stage II adenocarcinoma of rectum.  S/p colostomy 04/21/22.  Patient on folfox.  Noted recent hospitalizations for COPD exacerbation.    Spoke with patient via phone.  Patient reports that appetite is better.  Eating 3 meals a day with snacks.  Sometimes drinks high protein shake.  Breakfast this am was eggs and toast.  Likes yogurt.  Lunch is M[employer] Meals or sandwich, or soup.  Dinner is meat, carb and vegetable.  Reports mouth sores.  Rinsing with magic mouthwash.  Does not feel like mouth sores are effecting intake.  Did have some nausea few days after treatment.  Has ostomy and fluctuates between constipation and diarrhea.      Medications: reviewed  Labs: glucose 147, creatinine 1.02  Anthropometrics:   Weight 132 lb 3.2 oz on 10/18 132 lb 4.8 oz on 8/3 130 lb 1.6 oz on 7/24 134 lb 6.4 oz on 7/14 140 lb 6.4 oz on 6/16 142 lb on 6/2   NUTRITION DIAGNOSIS: Inadequate oral intake stable   INTERVENTION:  Continue to encourage well balanced diet with good sources of protein.   Oral nutrition supplements as needed    MONITORING, EVALUATION, GOAL: weight trends, intake   NEXT VISIT: Tuesday, Nov 21 phone call  Whitleigh Garramone B. Zenia Resides, [place], [place] Registered Dietitian [phone]

## 2022-10-20 ENCOUNTER — Other Ambulatory Visit: Payer: Self-pay | Admitting: Family Medicine

## 2022-10-20 ENCOUNTER — Other Ambulatory Visit: Payer: Self-pay | Admitting: Nurse Practitioner

## 2022-10-20 DIAGNOSIS — E1169 Type 2 diabetes mellitus with other specified complication: Secondary | ICD-10-CM

## 2022-10-20 DIAGNOSIS — E1129 Type 2 diabetes mellitus with other diabetic kidney complication: Secondary | ICD-10-CM

## 2022-10-20 NOTE — Telephone Encounter (Signed)
Requested medication (s) are due for refill today: yes  Requested medication (s) are on the active medication list: yes  Last refill:  07/06/22  Future visit scheduled:yes  Notes to clinic:  Medication not assigned to a protocol, review manually     Requested Prescriptions  Pending Prescriptions Disp Refills   icosapent Ethyl (VASCEPA) 1 g capsule [Pharmacy Med Name: ICOSAPENT ETHYL 1 GM CAP] 120 capsule 3    Sig: TAKE 2 CAPSULES BY MOUTH 2 TIMES DAILY     Off-Protocol Failed - 10/20/2022  1:59 PM      Failed - Medication not assigned to a protocol, review manually.      Passed - Valid encounter within last 12 months    Recent Outpatient Visits           2 weeks ago Hospital discharge follow-up   [facility] Steele Sizer, MD   1 month ago Hypoxemia   [facility] Steele Sizer, MD   1 month ago Encounter for [employer] annual wellness exam   [facility] Steele Sizer, MD   3 months ago Type 2 diabetes mellitus with microalbuminuria, without long-term current use of insulin [facility])   [facility] Steele Sizer, MD   7 months ago Type 2 diabetes mellitus with microalbuminuria, without long-term current use of insulin [facility])   [facility] Steele Sizer, MD       Future Appointments             In 3 weeks Ancil Boozer, Drue Stager, MD [facility], [place]   In 10 months  [facility]

## 2022-10-23 MED FILL — Dexamethasone Sodium Phosphate Inj 100 MG/10ML: INTRAMUSCULAR | Qty: 1 | Status: AC

## 2022-10-24 ENCOUNTER — Inpatient Hospital Stay: Payer: Medicare Other

## 2022-10-24 ENCOUNTER — Inpatient Hospital Stay (HOSPITAL_BASED_OUTPATIENT_CLINIC_OR_DEPARTMENT_OTHER): Payer: Medicare Other | Admitting: Oncology

## 2022-10-24 ENCOUNTER — Encounter: Payer: Self-pay | Admitting: Oncology

## 2022-10-24 DIAGNOSIS — C20 Malignant neoplasm of rectum: Secondary | ICD-10-CM

## 2022-10-24 DIAGNOSIS — G620 Drug-induced polyneuropathy: Secondary | ICD-10-CM

## 2022-10-24 DIAGNOSIS — T451X5A Adverse effect of antineoplastic and immunosuppressive drugs, initial encounter: Secondary | ICD-10-CM | POA: Insufficient documentation

## 2022-10-24 DIAGNOSIS — N1831 Chronic kidney disease, stage 3a: Secondary | ICD-10-CM | POA: Diagnosis not present

## 2022-10-24 DIAGNOSIS — Z5111 Encounter for antineoplastic chemotherapy: Secondary | ICD-10-CM | POA: Diagnosis not present

## 2022-10-24 DIAGNOSIS — D696 Thrombocytopenia, unspecified: Secondary | ICD-10-CM

## 2022-10-24 DIAGNOSIS — Z720 Tobacco use: Secondary | ICD-10-CM

## 2022-10-24 HISTORY — DX: Adverse effect of antineoplastic and immunosuppressive drugs, initial encounter: G62.0

## 2022-10-24 LAB — COMPREHENSIVE METABOLIC PANEL
ALT: 45 U/L — ABNORMAL HIGH (ref 0–44)
AST: 55 U/L — ABNORMAL HIGH (ref 15–41)
Albumin: 4 g/dL (ref 3.5–5.0)
Alkaline Phosphatase: 93 U/L (ref 38–126)
Anion gap: 8 (ref 5–15)
BUN: 17 mg/dL (ref 6–20)
CO2: 25 mmol/L (ref 22–32)
Calcium: 8.8 mg/dL — ABNORMAL LOW (ref 8.9–10.3)
Chloride: 103 mmol/L (ref 98–111)
Creatinine, Ser: 0.89 mg/dL (ref 0.44–1.00)
GFR, Estimated: >60 mL/min (ref >60)
Glucose, Bld: 221 mg/dL — ABNORMAL HIGH (ref 70–99)
Potassium: 4.1 mmol/L (ref 3.5–5.1)
Sodium: 136 mmol/L (ref 135–145)
Total Bilirubin: 0.5 mg/dL (ref 0.3–1.2)
Total Protein: 7.2 g/dL (ref 6.5–8.1)

## 2022-10-24 LAB — CBC WITH DIFFERENTIAL/PLATELET
Abs Immature Granulocytes: 0.01 10*3/uL (ref 0.00–0.07)
Basophils Absolute: 0 10*3/uL (ref 0.0–0.1)
Basophils Relative: 0 %
Eosinophils Absolute: 0 10*3/uL (ref 0.0–0.5)
Eosinophils Relative: 1 %
HCT: 39.9 % (ref 36.0–46.0)
Hemoglobin: 13.6 g/dL (ref 12.0–15.0)
Immature Granulocytes: 0 %
Lymphocytes Relative: 15 %
Lymphs Abs: 0.7 10*3/uL (ref 0.7–4.0)
MCH: 35 pg — ABNORMAL HIGH (ref 26.0–34.0)
MCHC: 34.1 g/dL (ref 30.0–36.0)
MCV: 102.6 fL — ABNORMAL HIGH (ref 80.0–100.0)
Monocytes Absolute: 0.6 10*3/uL (ref 0.1–1.0)
Monocytes Relative: 13 %
Neutro Abs: 3.6 10*3/uL (ref 1.7–7.7)
Neutrophils Relative %: 71 %
Platelets: 105 10*3/uL — ABNORMAL LOW (ref 150–400)
RBC: 3.89 MIL/uL (ref 3.87–5.11)
RDW: 13.2 % (ref 11.5–15.5)
WBC: 5.1 10*3/uL (ref 4.0–10.5)
nRBC: 0 % (ref 0.0–0.2)

## 2022-10-24 MED ORDER — OXALIPLATIN CHEMO INJECTION 100 MG/20ML
75.0000 mg/m2 | Freq: Once | INTRAVENOUS | Status: AC
  Administered 2022-10-24: 120 mg via INTRAVENOUS
  Filled 2022-10-24: qty 10

## 2022-10-24 MED ORDER — FAMOTIDINE IN NACL 20-0.9 MG/50ML-% IV SOLN
20.0000 mg | Freq: Once | INTRAVENOUS | Status: AC
  Administered 2022-10-24: 20 mg via INTRAVENOUS
  Filled 2022-10-24: qty 50

## 2022-10-24 MED ORDER — SODIUM CHLORIDE 0.9 % IV SOLN
2400.0000 mg/m2 | INTRAVENOUS | Status: DC
  Administered 2022-10-24: 3800 mg via INTRAVENOUS
  Filled 2022-10-24: qty 76

## 2022-10-24 MED ORDER — LEUCOVORIN CALCIUM INJECTION 350 MG
650.0000 mg | Freq: Once | INTRAVENOUS | Status: AC
  Administered 2022-10-24: 650 mg via INTRAVENOUS
  Filled 2022-10-24: qty 25

## 2022-10-24 MED ORDER — DEXTROSE 5 % IV SOLN
Freq: Once | INTRAVENOUS | Status: AC
  Filled 2022-10-24: qty 250

## 2022-10-24 MED ORDER — DIPHENHYDRAMINE HCL 25 MG PO CAPS
50.0000 mg | ORAL_CAPSULE | Freq: Once | ORAL | Status: AC
  Administered 2022-10-24: 50 mg via ORAL
  Filled 2022-10-24: qty 2

## 2022-10-24 MED ORDER — SODIUM CHLORIDE 0.9% FLUSH
10.0000 mL | INTRAVENOUS | Status: DC | PRN
  Filled 2022-10-24: qty 10

## 2022-10-24 MED ORDER — SODIUM CHLORIDE 0.9 % IV SOLN
10.0000 mg | Freq: Once | INTRAVENOUS | Status: AC
  Administered 2022-10-24: 10 mg via INTRAVENOUS
  Filled 2022-10-24: qty 10

## 2022-10-24 MED ORDER — PALONOSETRON HCL INJECTION 0.25 MG/5ML
0.2500 mg | Freq: Once | INTRAVENOUS | Status: AC
  Administered 2022-10-24: 0.25 mg via INTRAVENOUS
  Filled 2022-10-24: qty 5

## 2022-10-24 NOTE — Assessment & Plan Note (Signed)
Chemotherapy plan as listed above 

## 2022-10-24 NOTE — Patient Instructions (Signed)
[facility]  Discharge Instructions: Thank you for choosing [facility] to provide your oncology and hematology care.  If you have a lab appointment with the Cancer Center, please go directly to the Cancer Center and check in at the registration area.  Wear comfortable clothing and clothing appropriate for easy access to any Portacath or PICC line.   We strive to give you quality time with your provider. You may need to reschedule your appointment if you arrive late (15 or more minutes).  Arriving late affects you and other patients whose appointments are after yours.  Also, if you miss three or more appointments without notifying the office, you may be dismissed from the clinic at the provider's discretion.      For prescription refill requests, have your pharmacy contact our office and allow 72 hours for refills to be completed.    Today you received the following chemotherapy and/or immunotherapy agents- Oxaliplatin, leucovorin, 5FU      To help prevent nausea and vomiting after your treatment, we encourage you to take your nausea medication as directed.  BELOW ARE SYMPTOMS THAT SHOULD BE REPORTED IMMEDIATELY: *FEVER GREATER THAN 100.4 F (38 C) OR HIGHER *CHILLS OR SWEATING *NAUSEA AND VOMITING THAT IS NOT CONTROLLED WITH YOUR NAUSEA MEDICATION *UNUSUAL SHORTNESS OF BREATH *UNUSUAL BRUISING OR BLEEDING *URINARY PROBLEMS (pain or burning when urinating, or frequent urination) *BOWEL PROBLEMS (unusual diarrhea, constipation, pain near the anus) TENDERNESS IN MOUTH AND THROAT WITH OR WITHOUT PRESENCE OF ULCERS (sore throat, sores in mouth, or a toothache) UNUSUAL RASH, SWELLING OR PAIN  UNUSUAL VAGINAL DISCHARGE OR ITCHING   Items with * indicate a potential emergency and should be followed up as soon as possible or go to the Emergency Department if any problems should occur.  Please show the CHEMOTHERAPY ALERT CARD or IMMUNOTHERAPY ALERT  CARD at check-in to the Emergency Department and triage nurse.  Should you have questions after your visit or need to cancel or reschedule your appointment, please contact [facility]  [phone] and follow the prompts.  Office hours are 8:00 a.m. to 4:30 p.m. Monday - Friday. Please note that voicemails left after 4:00 p.m. may not be returned until the following business day.  We are closed weekends and major holidays. You have access to a nurse at all times for urgent questions. Please call the main number to the clinic [phone] and follow the prompts.  For any non-urgent questions, you may also contact your provider using MyChart. We now offer e-Visits for anyone 18 and older to request care online for non-urgent symptoms. For details visit [email or web address].   Also download the MyChart app! Go to the app store, search "MyChart", open the app, select [facility], and log in with your MyChart username and password.  Masks are optional in the cancer centers. If you would like for your care team to wear a mask while they are taking care of you, please let them know. For doctor visits, patients may have with them one support person who is at least 50 years old. At this time, visitors are not allowed in the infusion area.  

## 2022-10-24 NOTE — Progress Notes (Signed)
Hematology/Oncology Progress note [phone] [phone]      ASSESSMENT & PLAN:   Cancer Staging  Rectal cancer [facility]) Staging form: Colon and Rectum, AJCC 8th Edition - Pathologic stage from 05/03/2022: Stage IIIB (pT3, pN1b, cM0) - Signed by April Server, Luna on 05/03/2022   Rectal cancer ([place]) Stage III. pT3 pN1b cM0, s/p APR. Currently on adjuvant chemotherapy. S/p FOLFOX x 4 cycles 09/12/22 S/p concurrent chemotherapy [Xeloda 1300 mg BID] with RT  Labs reviewed and discussed with patient Proceed with FOLFOX Dose reduced oxliplatin 12m/m2, omit 5-FU bolus   Encounter for antineoplastic chemotherapy Chemotherapy plan as listed above  [place]ocyt[place] Trending low, close monitor.  CKD (chronic kidney disease) Encourage oral hydration   Tobacco use Recently quitted.  Encouraged her cessation effort.    Chemotherapy-induced neuropathy (HCC) Grade 1, stabe  No orders of the defined types were placed in this encounter.   Follow-up  2 weeks lab [facility] All questions were answered. The patient knows to call the clinic with any problems, questions or concerns.  ZEarlie Server MD, PhD [facility] Hematology Oncology 10/24/2022      CHIEF COMPLAINTS/REASON FOR VISIT:  Follow-up for rectal cancer treatments.  HISTORY OF PRESENTING ILLNESS:   April LIBBEYis a  51y.o.  female presents for treatment of rectal cancer Oncology History  Rectal cancer ([place]  02/26/2022 Imaging   MRI PELVIS WITHOUT CONTRAST- By imaging, rectal cancer stage:  T1/T2, N0, Mx    03/02/2022 Imaging   CT CHEST AND ABDOMEN WITH CONTRAST 1. No convincing evidence of metastatic disease within the chest or abdomen. 2. Atrophic left kidney with multifocal renal scarring and cortical calcifications as well as nonobstructive renal stones measuring up to 5 mm. 3. Prominent left-sided predominant retroperitoneal lymph nodes measuring up to 8 mm near the level of the  renal hilum, overall decreased in size dating back to CT September 19, 2018 and favored reactive related to left renal inflammation. 4.  Aortic Atherosclerosis (ICD10-I70.0).   03/27/2022 Genetic Testing    [facility] CustomNext+RNA cancer panel found no pathogenic mutations.    04/21/2022 Initial Diagnosis   Rectal cancer - baseline CEA 3.6 -02/09/2022, patient had colonoscopy which showed renal mass 10 cm from anal verge.  5 mm polyp in ascending colon.  Removed and retrieved. Pathology showed rectal adenocarcinoma.  The polyp in the ascending colon is a tubular adenoma. -04/21/2022, patient underwent robotic assisted ultralow anterior resection. Pathology showed moderately differentiated adenocarcinoma, 4.5 cm in maximal extent, with focal extension through muscularis propria into perirectal soft tissue.  3 lymph nodes positive for metastatic carcinoma.  Negative margin.  pT3 pN1b, MSI stable.   05/03/2022 Cancer Staging   Staging form: Colon and Rectum, AJCC 8th Edition - Pathologic stage from 05/03/2022: Stage IIIB (pT3, pN1b, cM0) - Signed by YEarlie Server Luna on 05/03/2022 Stage prefix: Initial diagnosis   05/11/2022 Miscellaneous   Medi port placed by Dr.White   05/26/2022 -  Chemotherapy   FOLFOX Q2 weeks x 4   05/26/2022 - 07/09/2022 Chemotherapy   Patient is on Treatment Plan : COLORECTAL FOLFOX q14d x 8 cycles     05/26/2022 -  Chemotherapy   Patient is on Treatment Plan : COLORECTAL FOLFOX q14d x 4 months     07/27/2022 -  Chemotherapy   Concurrent chemotherapy- Xeloda  13035mBID and radiation.    07/27/2022 - 09/12/2022 Radiation Therapy    concurrent chemotherapy [Xeloda 1300 mg BID] with RT  09/20/2022 Imaging   CT Angiogram chest PET protocol There is no evidence of pulmonary artery embolism. There is no evidence of thoracic aortic dissection.   Small linear patchy alveolar infiltrate is seen in medial segment of right middle lobe suggesting atelectasis/pneumonia.     09/28/2022 -  09/30/2022 Hospital Admission   Admission due to acute respiratory failure with hypoxia, COPD exacerbation   09/28/2022 Imaging   CT angio chest PE protocol 1. Negative for acute PE or thoracic aortic dissection. 2. New ground-glass infiltrates anteriorly in bilateral upper lobes,may represent atypical edema, infectious or inflammatory process. 3.  Aortic Atherosclerosis     Patient has bipolar and schizophrenia..  Patient is married and lives at home with her husband and son.  INTERVAL HISTORY April Luna is a 50 y.o. female who has above history reviewed by me today presents for follow up visit for rectal cancer.  She was hospitalized due to acute respiratory failure, pneumonia.  Intermittent constipation/diarrhea,manageable symptoms.  No new complaints.  Intermittent finger tip numbness/tingling. Stable.   Review of Systems  Constitutional:  Negative for appetite change, chills, fatigue and fever.  HENT:   Negative for hearing loss and voice change.   Eyes:  Negative for eye problems.  Respiratory:  Negative for chest tightness, cough and shortness of breath.   Cardiovascular:  Negative for chest pain.  Gastrointestinal:  Negative for abdominal distention, abdominal pain and blood in stool.       Diarrhea alternating with constipation.  Endocrine: Negative for hot flashes.  Genitourinary:  Negative for difficulty urinating and frequency.   Musculoskeletal:  Negative for arthralgias.  Skin:  Negative for itching and rash.  Neurological:  Positive for numbness. Negative for extremity weakness.  Hematological:  Negative for adenopathy.  Psychiatric/Behavioral:  Negative for confusion.     MEDICAL HISTORY:  Past Medical History:  Diagnosis Date   Allergy    pollen   Anxiety    Arthritis    right hip   Bipolar 1 disorder (HCC)    Cancer (HCC)    rectal   Chronic kidney disease    COPD (chronic obstructive pulmonary disease) (HCC)    Depression    Family history  of adverse reaction to anesthesia    grand father had a stroke during anesthesia   Family history of breast cancer    Family history of colon cancer    Family history of uterine cancer    GERD (gastroesophageal reflux disease)    History of kidney stones    Hyperlipidemia    Hypertension    Hypothyroidism    Panic attack    Pneumonia    Psoriasis    Sleep apnea 08/11/2021   No CPAP   Type 2 diabetes mellitus with microalbuminuria, without long-term current use of insulin ([place]) 06/24/2019    SURGICAL HISTORY: Past Surgical History:  Procedure Laterality Date   BREAST BIOPSY Left 12/14/2021   [place] bx, venus marker, path pending   CESAREAN SECTION     COLONOSCOPY WITH PROPOFOL N/A 02/09/2022   Procedure: COLONOSCOPY WITH PROPOFOL;  Surgeon: Lin Landsman, Luna;  Location: [place];  Service: Endoscopy;  Laterality: N/A;  sleep apnea   CYSTOSCOPY W/ RETROGRADES Left 11/08/2018   Procedure: CYSTOSCOPY WITH RETROGRADE PYELOGRAM;  Surgeon: Billey Co, Luna;  Location: [facility];  Service: Urology;  Laterality: Left;   CYSTOSCOPY/URETEROSCOPY/HOLMIUM LASER/STENT PLACEMENT Left 11/08/2018   Procedure: CYSTOSCOPY/URETEROSCOPY/HOLMIUM LASER/STENT PLACEMENT;  Surgeon: Billey Co, Luna;  Location: [facility];  Service: Urology;  Laterality: Left;   IR IMAGING GUIDED PORT INSERTION  05/11/2022   MOUTH SURGERY     wisdom teeth extraction   MOUTH SURGERY     teeth removal   POLYPECTOMY N/A 02/09/2022   Procedure: POLYPECTOMY;  Surgeon: Lin Landsman, Luna;  Location: [place];  Service: Endoscopy;  Laterality: N/A;   XI ROBOTIC ASSISTED LOWER ANTERIOR RESECTION N/A 04/21/2022   Procedure: XI ROBOTIC ASSISTED LOWER ANTERIOR RESECTION WITH COLOSTOMY, BILATERAL TAP BLOCK, ASSESSMENT OF TISSUE PERFUSSION WITH FIREFLY INJECTION;  Surgeon: Ileana Roup, Luna;  Location: [facility];  Service: General;  Laterality: N/A;    SOCIAL HISTORY: Social History    Socioeconomic History   Marital status: Married    Spouse name: Montine Circle   Number of children: 1   Years of education: 12   Highest education level: High school graduate  Occupational History   Occupation: unemployed    Comment: disabled  Tobacco Use   Smoking status: Some Days    Packs/day: 0.25    Years: 34.00    Total pack years: 8.50    Types: Cigarettes    Start date: 05/13/1985   Smokeless tobacco: Never   Tobacco comments:    3-4 cigarettes weekly- 10/11/2022  Vaping Use   Vaping Use: Former   Start date: 05/25/2018   Quit date: 09/24/2018  Substance and Sexual Activity   Alcohol use: Not Currently    Alcohol/week: 4.0 standard drinks of alcohol    Types: 4 Glasses of wine per week    Comment: quit ETOH in Feb. 2023   Drug use: Not Currently    Types: Marijuana    Comment: 2 days ago   Sexual activity: Not Currently    Birth control/protection: Post-menopausal  Other Topics Concern   Not on file  Social History Narrative   Lives with husband and son    Social Determinants of Health   Financial Resource Strain: High Risk (08/25/2022)   Overall Financial Resource Strain (CARDIA)    Difficulty of Paying Living Expenses: Hard  Food Insecurity: No Food Insecurity (09/20/2022)   Hunger Vital Sign    Worried About Running Out of Food in the Last Year: Never true    [place] in the Last Year: Never true  Recent Concern: [place] Present (08/25/2022)   Hunger Vital Sign    Worried About Running Out of Food in the Last Year: Sometimes true    Ran Out of Food in the Last Year: Sometimes true  Transportation Needs: Unmet Transportation Needs (10/09/2022)   PRAPARE - Hydrologist (Medical): Yes    Lack of Transportation (Non-Medical): Yes  Physical Activity: Insufficiently Active (08/25/2022)   Exercise Vital Sign    Days of Exercise per Week: 1 day    Minutes of Exercise per Session: 10 min  Stress:  Stress Concern Present (08/25/2022)   [place]    Feeling of Stress : To some extent  Social Connections: Moderately Integrated (08/25/2022)   Social Connection and Isolation Panel [NHANES]    Frequency of Communication with Friends and Family: More than three times a week    Frequency of Social Gatherings with Friends and Family: Twice a week    Attends Religious Services: Never    Marine scientist or Organizations: No    Attends Archivist Meetings: 1 to 4 times per  year    Marital Status: Married  Human resources officer Violence: Not At Risk (09/20/2022)   Humiliation, Afraid, Rape, and Kick questionnaire    Fear of Current or Ex-Partner: No    Emotionally Abused: No    Physically Abused: No    Sexually Abused: No    FAMILY HISTORY: Family History  Problem Relation Age of Onset   Depression Mother    Anxiety disorder Mother    Diabetes Mother    Hypertension Mother    Hyperlipidemia Mother    Cancer Mother    Uterine cancer Mother 58   Cervical cancer Mother 71   Colon cancer Father    Depression Brother    Anxiety disorder Brother    Cancer Maternal Aunt        unk types   Diabetes Mellitus II Maternal Grandmother    Hypercholesterolemia Maternal Grandmother    Breast cancer Maternal Grandmother    Cancer Paternal Grandmother    Diabetes Paternal Grandmother    Melanoma Paternal Grandmother    Stomach cancer Paternal Grandmother     ALLERGIES:  is allergic to metformin and related, nsaids, perphenazine, sulfa antibiotics, abilify [aripiprazole], and penicillins.  MEDICATIONS:  Current Outpatient Medications  Medication Sig Dispense Refill   ACCU-CHEK GUIDE test strip USE UP TO FOUR TIMES DAILY AS DIRECTED. 100 each 5   acetaminophen (TYLENOL) 325 MG tablet Take 650 mg by mouth every 6 (six) hours as needed for headache (pain).     albuterol (PROVENTIL) (2.5 MG/3ML) 0.083% nebulizer solution  Take 3 mLs (2.5 mg total) by nebulization every 6 (six) hours as needed for shortness of breath or wheezing. 75 mL 0   albuterol (VENTOLIN HFA) 108 (90 Base) MCG/ACT inhaler Inhale 2 puffs into the lungs every 6 (six) hours as needed for wheezing or shortness of breath. 1 each 5   amantadine (SYMMETREL) 100 MG capsule Take 100 mg by mouth 2 (two) times daily.     blood glucose meter kit and supplies Dispense based on patient and insurance preference. Use up to four times daily as directed. (FOR ICD-10 E10.9, E11.9). 1 each 0   Budeson-Glycopyrrol-Formoterol (BREZTRI AEROSPHERE) 160-9-4.8 MCG/ACT AERO Inhale 2 puffs into the lungs in the morning and at bedtime. 5.9 g 11   buPROPion (WELLBUTRIN XL) 300 MG 24 hr tablet Take 300 mg by mouth daily.     Cholecalciferol (VITAMIN D-3) 125 MCG (5000 UT) TABS Take 5,000 Units by mouth daily. 30 tablet 1   diltiazem (CARDIZEM CD) 180 MG 24 hr capsule Take 1 capsule (180 mg total) by mouth daily. 30 capsule 0   docusate sodium (COLACE) 100 MG capsule TAKE 1 CAPSULE BY MOUTH TWICE DAILY 60 capsule 1   FARXIGA 10 MG TABS tablet TAKE 1 TABLET BY MOUTH DAILY BEFORE BREAKFAST 30 tablet 0   gabapentin (NEURONTIN) 300 MG capsule Take 300 mg by mouth 2 (two) times daily.     hydrocortisone cream 0.5 % Apply 1 Application topically 2 (two) times daily as needed for itching. 30 g 1   Hydrocortisone, Perianal, 1 % CREA Apply topically.     hydrOXYzine (VISTARIL) 50 MG capsule Take 50 mg by mouth 3 (three) times daily as needed for anxiety.     icosapent Ethyl (VASCEPA) 1 g capsule TAKE 2 CAPSULES BY MOUTH 2 TIMES DAILY 120 capsule 3   insulin detemir (LEVEMIR FLEXPEN) 100 UNIT/ML FlexPen Inject 4-10 Units into the skin daily. 15 mL 0   Insulin Pen Needle 32G X  4 MM MISC 1 each by Does not apply route daily at 12 noon. 100 each 0   ipratropium-albuterol (DUONEB) 0.5-2.5 (3) MG/3ML SOLN Take 3 mLs by nebulization every 6 (six) hours as needed. 360 mL 11   levothyroxine  (SYNTHROID) 75 MCG tablet TAKE 1 TABLET BY MOUTH DAILY BEFORE BREAKFAST 90 tablet 2   loratadine (CLARITIN) 10 MG tablet Take 10 mg by mouth every morning.     losartan (COZAAR) 25 MG tablet Take 1 tablet (25 mg total) by mouth at bedtime. 30 tablet 3   nicotine (NICODERM CQ - DOSED IN MG/24 HOURS) 21 mg/24hr patch Aplly 78m patch chest wall daily (okay to substitute generic) 28 patch 0   nicotine polacrilex (NICOTINE MINI) 2 MG lozenge Take 1 lozenge (2 mg total) by mouth every 2 (two) hours as needed for smoking cessation. 72 lozenge 3   NON FORMULARY Pt uses a cpap nightly     paliperidone (INVEGA SUSTENNA) 234 MG/1.5ML SUSY injection Inject 234 mg into the muscle every 30 (thirty) days. On or about the 14th of each month     pantoprazole (PROTONIX) 40 MG tablet TAKE 1 TABLET BY MOUTH DAILY 90 tablet 2   Pediatric Multivit-Minerals-C (CHEWABLES MULTIVITAMIN PO) Take 1 tablet by mouth daily.     rosuvastatin (CRESTOR) 40 MG tablet Take 1 tablet (40 mg total) by mouth every morning. 30 tablet 3   sertraline (ZOLOFT) 100 MG tablet Take 200 mg by mouth every morning.     Spacer/Aero-Holding Chambers (AEROCHAMBER MV) inhaler Use as instructed 1 each 0   nicotine polacrilex (NICORETTE) 2 MG gum Take 1 each (2 mg total) by mouth every 4 (four) hours while awake. (Patient not taking: Reported on 10/24/2022) 150 tablet 0   pioglitazone (ACTOS) 15 MG tablet Take 15 mg by mouth daily. (Patient not taking: Reported on 10/11/2022)     No current facility-administered medications for this visit.   Facility-Administered Medications Ordered in Other Visits  Medication Dose Route Frequency Provider Last Rate Last Admin   fluorouracil (ADRUCIL) 3,800 mg in sodium chloride 0.9 % 74 mL chemo infusion  2,400 mg/m2 (Treatment Plan Recorded) Intravenous 1 day or 1 dose YEarlie Server Luna       leucovorin 650 mg in dextrose 5 % 250 mL infusion  650 mg Intravenous Once YEarlie Server Luna       oxaliplatin (ELOXATIN) 120 mg in  dextrose 5 % 500 mL chemo infusion  75 mg/m2 (Treatment Plan Recorded) Intravenous Once YEarlie Server Luna       sodium chloride flush (NS) 0.9 % injection 10 mL  10 mL Intracatheter PRN YEarlie Server Luna         PHYSICAL EXAMINATION: ECOG PERFORMANCE STATUS: 0 - Asymptomatic Vitals:   10/24/22 0837  BP: 131/84  Pulse: 90  Resp: 18  Temp: (!) 96.6 F (35.9 C)  SpO2: 95%    Filed Weights   10/24/22 0837  Weight: 135 lb 11.2 oz (61.6 kg)     Physical Exam Constitutional:      General: She is not in acute distress. HENT:     Head: Normocephalic and atraumatic.  Eyes:     General: No scleral icterus. Cardiovascular:     Rate and Rhythm: Normal rate and regular rhythm.     Heart sounds: Normal heart sounds.  Pulmonary:     Effort: Pulmonary effort is normal. No respiratory distress.     Breath sounds: Wheezing present.     Comments: Decreased  breath sound bilaterally Abdominal:     General: Bowel sounds are normal. There is no distension.     Palpations: Abdomen is soft.     Comments: Liquid stool in ostomy bag.  Musculoskeletal:        General: No deformity. Normal range of motion.     Cervical back: Normal range of motion and neck supple.  Skin:    General: Skin is warm and dry.     Findings: No erythema or rash.  Neurological:     Mental Status: She is alert and oriented to person, place, and time. Mental status is at baseline.     Cranial Nerves: No cranial nerve deficit.     Coordination: Coordination normal.  Psychiatric:        Mood and Affect: Mood normal.     LABORATORY DATA:  I have reviewed the data as listed    Latest Ref Rng & Units 10/24/2022    8:13 AM 10/10/2022    8:56 AM 10/05/2022   10:54 AM  CBC  WBC 4.0 - 10.5 K/uL 5.1  9.7  12.1   Hemoglobin 12.0 - 15.0 g/dL 13.6  12.7  13.5   Hematocrit 36.0 - 46.0 % 39.9  36.6  39.5   Platelets 150 - 400 K/uL 105  134  138       Latest Ref Rng & Units 10/24/2022    8:13 AM 10/10/2022    8:56 AM  10/05/2022   10:54 AM  CMP  Glucose 70 - 99 mg/dL 221  147  227   BUN 6 - 20 mg/dL _0 Creatinine 0.44 - 1.00 mg/dL 0.89  1.02  1.08   Sodium 135 - 145 mmol/L 136  138  139   Potassium 3.5 - 5.1 mmol/L 4.1  3.6  4.1   Chloride 98 - 111 mmol/L 103  103  101   CO2 22 - 32 mmol/L _1 Calcium 8.9 - 10.3 mg/dL 8.8  9.1  9.8   Total Protein 6.5 - 8.1 g/dL 7.2  6.9    Total Bilirubin 0.3 - 1.2 mg/dL 0.5  0.6    Alkaline Phos 38 - 126 U/L 93  78    AST 15 - 41 U/L 55  33    ALT 0 - 44 U/L 45  30          RADIOGRAPHIC STUDIES: I have personally reviewed the radiological images as listed and agreed with the findings in the report. CT Angio Chest PE W and/or Wo Contrast  Result Date: 09/28/2022 CLINICAL DATA:  Weakness, history of rectal carcinoma EXAM: CT ANGIOGRAPHY CHEST WITH CONTRAST TECHNIQUE: Multidetector CT imaging of the chest was performed using the standard protocol during bolus administration of intravenous contrast. Multiplanar CT image reconstructions and MIPs were obtained to evaluate the vascular anatomy. RADIATION DOSE REDUCTION: This exam was performed according to the departmental dose-optimization program which includes automated exposure control, adjustment of the mA and/or kV according to patient size and/or use of iterative reconstruction technique. CONTRAST:  63m OMNIPAQUE IOHEXOL 350 MG/ML SOLN COMPARISON:  09/20/2022 FINDINGS: Cardiovascular: Right IJ port catheter to the cavoatrial junction. Heart size normal. Trace pericardial fluid. The RV is nondilated. Satisfactory opacification of pulmonary arteries noted, and there is no evidence of pulmonary emboli. Adequate contrast opacification of the thoracic aorta with no evidence of dissection, aneurysm, or stenosis. There is classic 3-vessel brachiocephalic arch anatomy without proximal stenosis. Minimal  calcified atheromatous plaque in the arch and descending thoracic aorta. Mediastinum/Nodes: No mass or  adenopathy. Lungs/Pleura: No pleural effusion. No pneumothorax. Scattered ground-glass infiltrate in the anterior segments of both upper lobes, new since previous. Upper Abdomen: No acute findings. Musculoskeletal: No chest wall abnormality. No acute or significant osseous findings. Review of the MIP images confirms the above findings. IMPRESSION: 1. Negative for acute PE or thoracic aortic dissection. 2. New ground-glass infiltrates anteriorly in bilateral upper lobes, may represent atypical edema, infectious or inflammatory process. 3.  Aortic Atherosclerosis (ICD10-170.0). Electronically Signed   By: Lucrezia Europe M.D.   On: 09/28/2022 20:55   CT Head Wo Contrast  Result Date: 09/28/2022 CLINICAL DATA:  Worsening headaches and syncopal episode, initial encounter EXAM: CT HEAD WITHOUT CONTRAST TECHNIQUE: Contiguous axial images were obtained from the base of the skull through the vertex without intravenous contrast. RADIATION DOSE REDUCTION: This exam was performed according to the departmental dose-optimization program which includes automated exposure control, adjustment of the mA and/or kV according to patient size and/or use of iterative reconstruction technique. COMPARISON:  02/02/2017 FINDINGS: Brain: No evidence of acute infarction, hemorrhage, hydrocephalus, extra-axial collection or mass lesion/mass effect. Vascular: No hyperdense vessel or unexpected calcification. Skull: Normal. Negative for fracture or focal lesion. Sinuses/Orbits: No acute finding. Other: None. IMPRESSION: No acute intracranial abnormality noted. Electronically Signed   By: Inez Catalina M.D.   On: 09/28/2022 19:30   DG Chest 2 View  Result Date: 09/28/2022 CLINICAL DATA:  Weakness EXAM: CHEST - 2 VIEW COMPARISON:  Chest x-ray 06/14/2018 FINDINGS: Right chest port catheter tip projects over the SVC, unchanged. The heart size and mediastinal contours are within normal limits. Both lungs are clear. The visualized skeletal structures  are unremarkable. IMPRESSION: No active cardiopulmonary disease. Electronically Signed   By: Ronney Asters M.D.   On: 09/28/2022 18:12

## 2022-10-24 NOTE — Assessment & Plan Note (Signed)
Trending low, close monitor.

## 2022-10-24 NOTE — Assessment & Plan Note (Addendum)
Stage III. pT3 pN1b cM0, s/p APR. Currently on adjuvant chemotherapy. S/p FOLFOX x 4 cycles 09/12/22 S/p concurrent chemotherapy [Xeloda 1300 mg BID] with RT  Labs reviewed and discussed with patient Proceed with FOLFOX Dose reduced oxliplatin '75mg'$ /m2, omit 5-FU bolus

## 2022-10-24 NOTE — Assessment & Plan Note (Signed)
Recently quitted.  Encouraged her cessation effort.

## 2022-10-24 NOTE — Assessment & Plan Note (Signed)
Encourage oral hydration. ?

## 2022-10-24 NOTE — Assessment & Plan Note (Signed)
Grade 1, stabe

## 2022-10-26 ENCOUNTER — Inpatient Hospital Stay: Payer: Medicare Other

## 2022-10-26 ENCOUNTER — Inpatient Hospital Stay: Payer: Medicare Other | Attending: Oncology

## 2022-10-26 DIAGNOSIS — F319 Bipolar disorder, unspecified: Secondary | ICD-10-CM | POA: Diagnosis not present

## 2022-10-26 DIAGNOSIS — Z87442 Personal history of urinary calculi: Secondary | ICD-10-CM | POA: Diagnosis not present

## 2022-10-26 DIAGNOSIS — G4730 Sleep apnea, unspecified: Secondary | ICD-10-CM | POA: Diagnosis not present

## 2022-10-26 DIAGNOSIS — Z87891 Personal history of nicotine dependence: Secondary | ICD-10-CM | POA: Diagnosis not present

## 2022-10-26 DIAGNOSIS — K5900 Constipation, unspecified: Secondary | ICD-10-CM | POA: Insufficient documentation

## 2022-10-26 DIAGNOSIS — D696 Thrombocytopenia, unspecified: Secondary | ICD-10-CM | POA: Diagnosis not present

## 2022-10-26 DIAGNOSIS — E1122 Type 2 diabetes mellitus with diabetic chronic kidney disease: Secondary | ICD-10-CM | POA: Insufficient documentation

## 2022-10-26 DIAGNOSIS — I700 Atherosclerosis of aorta: Secondary | ICD-10-CM | POA: Diagnosis not present

## 2022-10-26 DIAGNOSIS — Z23 Encounter for immunization: Secondary | ICD-10-CM | POA: Diagnosis not present

## 2022-10-26 DIAGNOSIS — R202 Paresthesia of skin: Secondary | ICD-10-CM | POA: Insufficient documentation

## 2022-10-26 DIAGNOSIS — R197 Diarrhea, unspecified: Secondary | ICD-10-CM | POA: Diagnosis not present

## 2022-10-26 DIAGNOSIS — I129 Hypertensive chronic kidney disease with stage 1 through stage 4 chronic kidney disease, or unspecified chronic kidney disease: Secondary | ICD-10-CM | POA: Diagnosis not present

## 2022-10-26 DIAGNOSIS — F209 Schizophrenia, unspecified: Secondary | ICD-10-CM | POA: Insufficient documentation

## 2022-10-26 DIAGNOSIS — K219 Gastro-esophageal reflux disease without esophagitis: Secondary | ICD-10-CM | POA: Diagnosis not present

## 2022-10-26 DIAGNOSIS — N390 Urinary tract infection, site not specified: Secondary | ICD-10-CM | POA: Insufficient documentation

## 2022-10-26 DIAGNOSIS — E785 Hyperlipidemia, unspecified: Secondary | ICD-10-CM | POA: Insufficient documentation

## 2022-10-26 DIAGNOSIS — F17210 Nicotine dependence, cigarettes, uncomplicated: Secondary | ICD-10-CM | POA: Diagnosis not present

## 2022-10-26 DIAGNOSIS — C20 Malignant neoplasm of rectum: Secondary | ICD-10-CM | POA: Insufficient documentation

## 2022-10-26 DIAGNOSIS — E039 Hypothyroidism, unspecified: Secondary | ICD-10-CM | POA: Insufficient documentation

## 2022-10-26 DIAGNOSIS — Z79899 Other long term (current) drug therapy: Secondary | ICD-10-CM | POA: Diagnosis not present

## 2022-10-26 DIAGNOSIS — R200 Anesthesia of skin: Secondary | ICD-10-CM | POA: Diagnosis not present

## 2022-10-26 DIAGNOSIS — N200 Calculus of kidney: Secondary | ICD-10-CM | POA: Diagnosis not present

## 2022-10-26 MED ORDER — HEPARIN SOD (PORK) LOCK FLUSH 100 UNIT/ML IV SOLN
500.0000 [IU] | Freq: Once | INTRAVENOUS | Status: AC | PRN
  Administered 2022-10-26: 500 [IU]
  Filled 2022-10-26: qty 5

## 2022-10-26 MED ORDER — SODIUM CHLORIDE 0.9% FLUSH
10.0000 mL | INTRAVENOUS | Status: DC | PRN
  Administered 2022-10-26: 10 mL
  Filled 2022-10-26: qty 10

## 2022-10-30 ENCOUNTER — Other Ambulatory Visit: Payer: Self-pay | Admitting: Family Medicine

## 2022-10-30 ENCOUNTER — Ambulatory Visit: Payer: Self-pay | Admitting: *Deleted

## 2022-10-30 NOTE — Telephone Encounter (Signed)
I didn't get a renewal on the Cardizem so I am completely out of it and the Actos.   I'm not sure if I'm still supposed to be on the Actos or not but if so I need it filled too today if possible because I'm going out of town for the next 2 days leaving tomorrow (Tues).       My sugars have been running high.   I had pneumonia while I was in the hospital and was on steroids.   The hospital called into [facility] when I was released.  My pharmacy was closed.   I didn't get my medications until that Monday following when   I was released from the hospital over the weekend.    I think I have keto acidosis.   I'm having unusual symptoms.  To [place].    Should I stay on the Farxiga?   I have ketones in my urine in the hospital.   The [place] is causing UTIs.   I want to be checked for ketoacidosis.    I made her an appt. To discuss all these issues with Dr. Ancil Boozer.  Reason for Disposition  [1] Caller has URGENT medicine question about med that PCP or specialist prescribed AND [2] triager unable to answer question  Answer Assessment - Initial Assessment Questions 1. NAME of MEDICINE: "What medicine(s) are you calling about?"     Pt was released from the hospital over the weekend and her medications were called into a different pharmacy because her pharmacy was not open over the weekend.   She didn't get her medications until Monday. (It was difficult understanding exactly what she was needing as she was changes subjects and details about her medications).  I had to ask her multiple questions in order to get clarified what she needed.   2. QUESTION: "What is your question?" (e.g., double dose of medicine, side effect)     She wants her Cardizem filled in a separate bottle at the [facility].  (She uses [facility] normally).  If she is supposed to be on the Actos she needs to have that filled also at the above pharmacy.  I tried to schedule her with Dr. Ancil Boozer  because there were appts available 11/7 and 11/8 but she is going out of town for the next 2 days.  She is scheduled for Nov. 17th.  She is requesting these 2 medications be called in by 6:00 PM today if possible so she can have them when she goes out of town tomorrow.  She is also requesting her Farxiga 10 mg be called in today also.  They can deliver to her from this pharmacy listed above.   3. PRESCRIBER: "Who prescribed the medicine?" Reason: if prescribed by specialist, call should be referred to that group.     Dr. Steele Sizer 4. SYMPTOMS: "Do you have any symptoms?" If Yes, ask: "What symptoms are you having?"  "How bad are the symptoms (e.g., mild, moderate, severe)     She mentioned she is having urinary tract problems from the [place] that she wants to talk with Dr. Ancil Boozer about. 5. PREGNANCY:  "Is there any chance that you are pregnant?" "When was your last menstrual period?"     N/A  Protocols used: Medication Question Call-A-AH

## 2022-10-30 NOTE — Telephone Encounter (Signed)
Medication Refill - Medication: diltiazem (CARDIZEM CD) 180 MG 24 hr capsul  pioglitazone (ACTOS) 15 MG tablet   Has the patient contacted their pharmacy? Yes.   (Agent: If no, request that the patient contact the pharmacy for the refill. If patient does not wish to contact the pharmacy document the reason why and proceed with request.) (Agent: If yes, when and what did the pharmacy advise?)  Preferred Pharmacy (with phone number or street name):  [place] [place]  [address] [place] [address]  Phone: [phone] Fax: [phone]   Has the patient been seen for an appointment in the last year OR does the patient have an upcoming appointment? Yes.    Agent: Please be advised that RX refills may take up to 3 business days. We ask that you follow-up with your pharmacy.

## 2022-10-31 ENCOUNTER — Other Ambulatory Visit: Payer: Medicare Other

## 2022-10-31 ENCOUNTER — Ambulatory Visit: Payer: Medicare Other | Admitting: Family Medicine

## 2022-10-31 ENCOUNTER — Ambulatory Visit: Payer: Medicare Other | Admitting: Oncology

## 2022-10-31 ENCOUNTER — Other Ambulatory Visit: Payer: Self-pay | Admitting: Family Medicine

## 2022-10-31 ENCOUNTER — Ambulatory Visit: Payer: Medicare Other

## 2022-10-31 MED ORDER — DILTIAZEM HCL ER COATED BEADS 180 MG PO CP24: 180.0000 mg | ORAL_CAPSULE | Freq: Every day | ORAL | 0 refills | Status: DC

## 2022-10-31 NOTE — Telephone Encounter (Signed)
Pt is scheduled for Friday 11/03/22

## 2022-10-31 NOTE — Telephone Encounter (Signed)
Requested medication (s) are due for refill today:yes  Requested medication (s) are on the active medication list: yes  Last refill:  09/24/22  Future visit scheduled: yes  Notes to clinic:  Unable to refill per protocol, last refill by another provider.      Requested Prescriptions  Pending Prescriptions Disp Refills   diltiazem (CARDIZEM CD) 180 MG 24 hr capsule 30 capsule 0    Sig: Take 1 capsule (180 mg total) by mouth daily.     Cardiovascular: Calcium Channel Blockers 3 Failed - 10/30/2022  4:26 PM      Failed - ALT in normal range and within 360 days    ALT  Date Value Ref Range Status  10/24/2022 45 (H) 0 - 44 U/L Final   SGPT (ALT)  Date Value Ref Range Status  04/20/2015 30 U/L Final    Comment:    14-54 NOTE: New Reference Range  03/02/15          Failed - AST in normal range and within 360 days    AST  Date Value Ref Range Status  10/24/2022 55 (H) 15 - 41 U/L Final   SGOT(AST)  Date Value Ref Range Status  04/20/2015 29 U/L Final    Comment:    15-41 NOTE: New Reference Range  03/02/15          Passed - Cr in normal range and within 360 days    Creat  Date Value Ref Range Status  10/05/2022 1.08 (H) 0.50 - 0.99 mg/dL Final   Creatinine, Ser  Date Value Ref Range Status  10/24/2022 0.89 0.44 - 1.00 mg/dL Final   Creatinine, Urine  Date Value Ref Range Status  07/17/2022 73 20 - 275 mg/dL Final         Passed - Last BP in normal range    BP Readings from Last 1 Encounters:  10/24/22 131/84         Passed - Last Heart Rate in normal range    Pulse Readings from Last 1 Encounters:  10/24/22 90         Passed - Valid encounter within last 6 months    Recent Outpatient Visits           3 weeks ago Hospital discharge follow-up   [facility] Steele Sizer, MD   1 month ago Hypoxemia   [facility] Steele Sizer, MD   2 months ago Encounter for [employer] annual wellness exam   [facility] Steele Sizer, MD   3 months ago Type 2 diabetes mellitus with microalbuminuria, without long-term current use of insulin [facility])   [facility] [place], Drue Stager, MD   7 months ago Type 2 diabetes mellitus with microalbuminuria, without long-term current use of insulin [facility])   [facility] Steele Sizer, MD       Future Appointments             In 1 week Steele Sizer, MD [facility], [place]   In 1 week Steele Sizer, MD [place]   In 10 months  [facility]             pioglitazone (ACTOS) 15 MG tablet      Sig: Take 1 tablet (15 mg total) by mouth daily.     Endocrinology:  Diabetes - Glitazones - pioglitazone Passed - 10/30/2022  4:26 PM  Passed - HBA1C is between 0 and 7.9 and within 180 days    Hemoglobin A1C  Date Value Ref Range Status  07/17/2022 7.4 (A) 4.0 - 5.6 % Final  07/11/2022 7.8  Final         Passed - Valid encounter within last 6 months    Recent Outpatient Visits           3 weeks ago Hospital discharge follow-up   [facility] Steele Sizer, MD   1 month ago Hypoxemia   [facility] Steele Sizer, MD   2 months ago Encounter for [employer] annual wellness exam   [facility] Steele Sizer, MD   3 months ago Type 2 diabetes mellitus with microalbuminuria, without long-term current use of insulin [facility])   [facility] Steele Sizer, MD   7 months ago Type 2 diabetes mellitus with microalbuminuria, without long-term current use of insulin [facility])   [facility] Steele Sizer, MD       Future Appointments             In 1 week Steele Sizer, MD [facility], [place]   In 1 week Steele Sizer, MD [facility], [place]   In 10 months  [facility], [facility]

## 2022-11-02 NOTE — Progress Notes (Signed)
Name: April Luna   MRN: [id number]    DOB: 07-Dec-1971   Date:11/03/2022       Progress Note  Subjective  Chief Complaint  Discuss Medication  HPI  Supra pubic pain: she states about one week ago she noticed some supra pubic pain, no dysuria, fever or blood in urine. She has noticed urinary frequency but no odor. She thinks it is the beginning of an UTI. She has been taking cranberry juice   DM: she states fasting still above 160, taking levemir 4 units daily, still has not started on Actos since rx was sent to incorrect pharmacy by [facility], [facility], advised to increase levemir to 6 units daily and check fasting glucose daily to keep it between 100-140, if 3 days average above 140 go up by 2 units if 3 days average below 100 go down by 2 units    Patient Active Problem List   Diagnosis Date Noted   Chemotherapy-induced neuropathy ([place]) 10/24/2022   Tachycardia 09/22/2022   Acute respiratory failure with hypoxia ([place]) 09/21/2022   Lobar pneumonia ([place]) 09/21/2022   COPD with acute exacerbation ([place]) 09/20/2022   Atherosclerosis of aorta ([place]) 07/17/2022   Stage 3a chronic kidney disease ([place]) 07/17/2022   Thrombocytopenia ([place]) 06/24/2022   Tobacco use 05/26/2022   Encounter for antineoplastic chemotherapy 05/17/2022   Goals of care, counseling/discussion 05/03/2022   Rectal cancer ([place]) 04/21/2022   Genetic testing 03/27/2022   Family history of colon cancer 03/14/2022   Family history of uterine cancer 03/14/2022   Family history of breast cancer 03/14/2022   Polyp of ascending colon    Obstructive sleep apnea syndrome 08/11/2021   Plantar callus 10/06/2019   Acquired hallux limitus of both feet 10/06/2019   Uncontrolled type 2 diabetes mellitus with hyperglycemia, without long-term current use of insulin ([place]) 06/24/2019   Chronic obstructive pulmonary disease ([place]) 06/24/2019   Chronic constipation 06/24/2019   ASCUS of cervix with negative  high risk HPV 09/05/2018   GERD without esophagitis 04/11/2018   Hyperlipidemia 04/11/2018   Essential hypertension 04/11/2018   Hypothyroidism 04/26/2015   Bipolar I disorder, most recent episode (or current) manic ([place]) 04/25/2015   Delirium due to another medical condition 04/25/2015   Cannabis abuse 04/25/2015    Past Surgical History:  Procedure Laterality Date   BREAST BIOPSY Left 12/14/2021   [place] bx, venus marker, path pending   CESAREAN SECTION     COLONOSCOPY WITH PROPOFOL N/A 02/09/2022   Procedure: COLONOSCOPY WITH PROPOFOL;  Surgeon: Lin Landsman, MD;  Location: [facility];  Service: Endoscopy;  Laterality: N/A;  sleep apnea   CYSTOSCOPY W/ RETROGRADES Left 11/08/2018   Procedure: CYSTOSCOPY WITH RETROGRADE PYELOGRAM;  Surgeon: Billey Co, MD;  Location: [facility];  Service: Urology;  Laterality: Left;   CYSTOSCOPY/URETEROSCOPY/HOLMIUM LASER/STENT PLACEMENT Left 11/08/2018   Procedure: CYSTOSCOPY/URETEROSCOPY/HOLMIUM LASER/STENT PLACEMENT;  Surgeon: Billey Co, MD;  Location: [facility];  Service: Urology;  Laterality: Left;   IR IMAGING GUIDED PORT INSERTION  05/11/2022   MOUTH SURGERY     wisdom teeth extraction   MOUTH SURGERY     teeth removal   POLYPECTOMY N/A 02/09/2022   Procedure: POLYPECTOMY;  Surgeon: Lin Landsman, MD;  Location: [facility];  Service: Endoscopy;  Laterality: N/A;   XI ROBOTIC ASSISTED LOWER ANTERIOR RESECTION N/A 04/21/2022   Procedure: XI ROBOTIC ASSISTED LOWER ANTERIOR RESECTION WITH COLOSTOMY, BILATERAL TAP BLOCK, ASSESSMENT OF TISSUE PERFUSSION WITH FIREFLY  INJECTION;  Surgeon: Ileana Roup, MD;  Location: [facility];  Service: General;  Laterality: N/A;    Family History  Problem Relation Age of Onset   Depression Mother    Anxiety disorder Mother    Diabetes Mother    Hypertension Mother    Hyperlipidemia Mother    Cancer Mother    Uterine cancer Mother 73   Cervical cancer Mother 87    Colon cancer Father    Depression Brother    Anxiety disorder Brother    Cancer Maternal Aunt        unk types   Diabetes Mellitus II Maternal Grandmother    Hypercholesterolemia Maternal Grandmother    Breast cancer Maternal Grandmother    Cancer Paternal Grandmother    Diabetes Paternal Grandmother    Melanoma Paternal Grandmother    Stomach cancer Paternal Grandmother     Social History   Tobacco Use   Smoking status: Some Days    Packs/day: 0.25    Years: 34.00    Total pack years: 8.50    Types: Cigarettes    Start date: 05/13/1985   Smokeless tobacco: Never   Tobacco comments:    3-4 cigarettes weekly- 10/11/2022  Substance Use Topics   Alcohol use: Not Currently    Alcohol/week: 4.0 standard drinks of alcohol    Types: 4 Glasses of wine per week    Comment: quit ETOH in Feb. 2023     Current Outpatient Medications:    ACCU-CHEK GUIDE test strip, USE UP TO FOUR TIMES DAILY AS DIRECTED., Disp: 100 each, Rfl: 5   acetaminophen (TYLENOL) 325 MG tablet, Take 650 mg by mouth every 6 (six) hours as needed for headache (pain)., Disp: , Rfl:    albuterol (VENTOLIN HFA) 108 (90 Base) MCG/ACT inhaler, Inhale 2 puffs into the lungs every 6 (six) hours as needed for wheezing or shortness of breath., Disp: 1 each, Rfl: 5   amantadine (SYMMETREL) 100 MG capsule, Take 100 mg by mouth 2 (two) times daily., Disp: , Rfl:    blood glucose meter kit and supplies, Dispense based on patient and insurance preference. Use up to four times daily as directed. (FOR ICD-10 E10.9, E11.9)., Disp: 1 each, Rfl: 0   Budeson-Glycopyrrol-Formoterol (BREZTRI AEROSPHERE) 160-9-4.8 MCG/ACT AERO, Inhale 2 puffs into the lungs in the morning and at bedtime., Disp: 5.9 g, Rfl: 11   buPROPion (WELLBUTRIN XL) 300 MG 24 hr tablet, Take 300 mg by mouth daily., Disp: , Rfl:    Cholecalciferol (VITAMIN D-3) 125 MCG (5000 UT) TABS, Take 5,000 Units by mouth daily., Disp: 30 tablet, Rfl: 1   diltiazem (CARDIZEM CD)  180 MG 24 hr capsule, Take 1 capsule (180 mg total) by mouth daily., Disp: 30 capsule, Rfl: 0   docusate sodium (COLACE) 100 MG capsule, TAKE 1 CAPSULE BY MOUTH TWICE DAILY, Disp: 60 capsule, Rfl: 1   FARXIGA 10 MG TABS tablet, TAKE 1 TABLET BY MOUTH DAILY BEFORE BREAKFAST, Disp: 30 tablet, Rfl: 0   gabapentin (NEURONTIN) 300 MG capsule, Take 300 mg by mouth 2 (two) times daily., Disp: , Rfl:    hydrocortisone cream 0.5 %, Apply 1 Application topically 2 (two) times daily as needed for itching., Disp: 30 g, Rfl: 1   Hydrocortisone, Perianal, 1 % CREA, Apply topically., Disp: , Rfl:    hydrOXYzine (VISTARIL) 50 MG capsule, Take 50 mg by mouth 3 (three) times daily as needed for anxiety., Disp: , Rfl:    icosapent Ethyl (VASCEPA) 1  g capsule, TAKE 2 CAPSULES BY MOUTH 2 TIMES DAILY, Disp: 120 capsule, Rfl: 3   insulin detemir (LEVEMIR FLEXPEN) 100 UNIT/ML FlexPen, Inject 4-10 Units into the skin daily., Disp: 15 mL, Rfl: 0   Insulin Pen Needle 32G X 4 MM MISC, 1 each by Does not apply route daily at 12 noon., Disp: 100 each, Rfl: 0   ipratropium-albuterol (DUONEB) 0.5-2.5 (3) MG/3ML SOLN, Take 3 mLs by nebulization every 6 (six) hours as needed., Disp: 360 mL, Rfl: 11   levothyroxine (SYNTHROID) 75 MCG tablet, TAKE 1 TABLET BY MOUTH DAILY BEFORE BREAKFAST, Disp: 90 tablet, Rfl: 2   loratadine (CLARITIN) 10 MG tablet, Take 10 mg by mouth every morning., Disp: , Rfl:    losartan (COZAAR) 25 MG tablet, Take 1 tablet (25 mg total) by mouth at bedtime., Disp: 30 tablet, Rfl: 3   nicotine (NICODERM CQ - DOSED IN MG/24 HOURS) 21 mg/24hr patch, Aplly 72m patch chest wall daily (okay to substitute generic), Disp: 28 patch, Rfl: 0   nicotine polacrilex (NICOTINE MINI) 2 MG lozenge, Take 1 lozenge (2 mg total) by mouth every 2 (two) hours as needed for smoking cessation., Disp: 72 lozenge, Rfl: 3   NON FORMULARY, Pt uses a cpap nightly, Disp: , Rfl:    paliperidone (INVEGA SUSTENNA) 234 MG/1.5ML SUSY injection,  Inject 234 mg into the muscle every 30 (thirty) days. On or about the 14th of each month, Disp: , Rfl:    pantoprazole (PROTONIX) 40 MG tablet, TAKE 1 TABLET BY MOUTH DAILY, Disp: 90 tablet, Rfl: 2   Pediatric Multivit-Minerals-C (CHEWABLES MULTIVITAMIN PO), Take 1 tablet by mouth daily., Disp: , Rfl:    rosuvastatin (CRESTOR) 40 MG tablet, Take 1 tablet (40 mg total) by mouth every morning., Disp: 30 tablet, Rfl: 3   sertraline (ZOLOFT) 100 MG tablet, Take 200 mg by mouth every morning., Disp: , Rfl:    Spacer/Aero-Holding Chambers (AEROCHAMBER MV) inhaler, Use as instructed, Disp: 1 each, Rfl: 0   albuterol (PROVENTIL) (2.5 MG/3ML) 0.083% nebulizer solution, Take 3 mLs (2.5 mg total) by nebulization every 6 (six) hours as needed for shortness of breath or wheezing., Disp: 75 mL, Rfl: 0   nicotine polacrilex (NICORETTE) 2 MG gum, Take 1 each (2 mg total) by mouth every 4 (four) hours while awake. (Patient not taking: Reported on 11/03/2022), Disp: 150 tablet, Rfl: 0   pioglitazone (ACTOS) 15 MG tablet, Take 15 mg by mouth daily. (Patient not taking: Reported on 11/03/2022), Disp: , Rfl:   Allergies  Allergen Reactions   Metformin And Related Nausea And Vomiting   Nsaids Other (See Comments)    Stage 3 CKD   Perphenazine Other (See Comments)    Tremors, muscle weakness, tongue swelling   Sulfa Antibiotics Other (See Comments)    GI distress    Abilify [Aripiprazole] Rash   Penicillins Rash    She has taken amoxicillin without problems    I personally reviewed active problem list, medication list, allergies, family history, social history, health maintenance with the patient/caregiver today.   ROS  Ten systems reviewed and is negative except as mentioned in HPI   Objective  Vitals:   11/03/22 0952  BP: 122/76  Pulse: 94  Resp: 16  SpO2: 98%  Weight: 134 lb (60.8 kg)  Height: _0  (1.499 m)    Body mass index is 27.06 kg/m.  Physical Exam  Constitutional: Patient  appears well-developed and well-nourished. Obese  No distress.  HEENT: head atraumatic, normocephalic, pupils  equal and reactive to light, neck supple, throat within normal limits Cardiovascular: Normal rate, regular rhythm and normal heart sounds.  No murmur heard. No BLE edema. Pulmonary/Chest: Effort normal and breath sounds normal. No respiratory distress. Abdominal: Soft.  Mild supra pubic tenderness. Negative CVA tenderness  Psychiatric: Patient has a normal mood and affect. behavior is normal. Judgment and thought content normal.   PHQ2/9:    11/03/2022   10:00 AM 10/05/2022   10:12 AM 09/20/2022    8:59 AM 08/25/2022   10:15 AM 07/17/2022    1:02 PM  Depression screen PHQ 2/9  Decreased Interest 0 1 1 0 2  Down, Depressed, Hopeless _0 0 1  PHQ - 2 Score _1 0 3  Altered sleeping 0 _2 Tired, decreased energy 0 _3 Change in appetite 0 _4 Feeling bad or failure about yourself  0 0 0 0 1  Trouble concentrating 0 _5 Moving slowly or fidgety/restless 0 0 0 0 2  Suicidal thoughts 0 0 0 0 0  PHQ-9 Score _6 Difficult doing work/chores  Somewhat difficult Somewhat difficult  Somewhat difficult    phq 9 is negative   Fall Risk:    11/03/2022    9:52 AM 10/05/2022   10:11 AM 09/20/2022    8:44 AM 08/25/2022    9:58 AM 07/17/2022    1:29 PM  Fall Risk   Falls in the past year? 0 0 0 0 0  Number falls in past yr: 0   0   Injury with Fall? 0   0   Risk for fall due to : _7   Follow up Falls prevention discussed Falls prevention discussed;Education provided;Falls evaluation completed Falls prevention discussed Falls prevention discussed Falls prevention discussed      Functional Status Survey: Is the patient deaf or have difficulty hearing?: No Does the patient have difficulty seeing, even when wearing glasses/contacts?: No Does the patient have difficulty concentrating,  remembering, or making decisions?: No Does the patient have difficulty walking or climbing stairs?: No Does the patient have difficulty dressing or bathing?: No Does the patient have difficulty doing errands alone such as visiting a doctor's office or shopping?: No    Assessment & Plan  1. Lower abdominal pain  - POCT Urinalysis Dipstick - urine culture  Likely cystitis, positive nitrites on urine dip  2. Dyslipidemia associated with type 2 diabetes mellitus (HCC)  - pioglitazone (ACTOS) 15 MG tablet; Take 1 tablet (15 mg total) by mouth daily.  Dispense: 30 tablet; Refill: 0

## 2022-11-03 ENCOUNTER — Ambulatory Visit (INDEPENDENT_AMBULATORY_CARE_PROVIDER_SITE_OTHER): Payer: Medicare Other | Admitting: Family Medicine

## 2022-11-03 ENCOUNTER — Encounter: Payer: Self-pay | Admitting: Family Medicine

## 2022-11-03 VITALS — BP 122/76 | HR 94 | Resp 16 | Ht 59.0 in | Wt 134.0 lb

## 2022-11-03 DIAGNOSIS — E785 Hyperlipidemia, unspecified: Secondary | ICD-10-CM | POA: Diagnosis not present

## 2022-11-03 DIAGNOSIS — E1169 Type 2 diabetes mellitus with other specified complication: Secondary | ICD-10-CM | POA: Diagnosis not present

## 2022-11-03 DIAGNOSIS — R1030 Lower abdominal pain, unspecified: Secondary | ICD-10-CM

## 2022-11-03 LAB — POCT URINALYSIS DIPSTICK
Appearance: NORMAL
Bilirubin, UA: NEGATIVE
Blood, UA: NEGATIVE
Glucose, UA: POSITIVE — AB
Ketones, UA: NEGATIVE
Leukocytes, UA: NEGATIVE
Nitrite, UA: POSITIVE
Protein, UA: NEGATIVE
Spec Grav, UA: <=1.005 — AB (ref 1.010–1.025)
Urobilinogen, UA: 0.2 E.U./dL
pH, UA: 6 (ref 5.0–8.0)

## 2022-11-03 MED ORDER — NITROFURANTOIN MONOHYD MACRO 100 MG PO CAPS: 100.0000 mg | ORAL_CAPSULE | Freq: Two times a day (BID) | ORAL | 0 refills | Status: DC

## 2022-11-03 MED ORDER — PIOGLITAZONE HCL 15 MG PO TABS: 15.0000 mg | ORAL_TABLET | Freq: Every day | ORAL | 0 refills | Status: DC

## 2022-11-06 LAB — CULTURE, URINE COMPREHENSIVE
MICRO NUMBER:: 14177555
SPECIMEN QUALITY:: ADEQUATE

## 2022-11-06 MED FILL — Dexamethasone Sodium Phosphate Inj 100 MG/10ML: INTRAMUSCULAR | Qty: 1 | Status: AC

## 2022-11-07 ENCOUNTER — Other Ambulatory Visit: Payer: Self-pay | Admitting: Oncology

## 2022-11-07 ENCOUNTER — Inpatient Hospital Stay: Payer: Medicare Other

## 2022-11-07 ENCOUNTER — Inpatient Hospital Stay (HOSPITAL_BASED_OUTPATIENT_CLINIC_OR_DEPARTMENT_OTHER): Payer: Medicare Other | Admitting: Oncology

## 2022-11-07 ENCOUNTER — Encounter: Payer: Self-pay | Admitting: Oncology

## 2022-11-07 VITALS — BP 113/96 | Temp 97.2°F | Wt 133.3 lb

## 2022-11-07 DIAGNOSIS — Z5111 Encounter for antineoplastic chemotherapy: Secondary | ICD-10-CM

## 2022-11-07 DIAGNOSIS — D696 Thrombocytopenia, unspecified: Secondary | ICD-10-CM

## 2022-11-07 DIAGNOSIS — N3000 Acute cystitis without hematuria: Secondary | ICD-10-CM

## 2022-11-07 DIAGNOSIS — N390 Urinary tract infection, site not specified: Secondary | ICD-10-CM | POA: Insufficient documentation

## 2022-11-07 DIAGNOSIS — Z23 Encounter for immunization: Secondary | ICD-10-CM

## 2022-11-07 DIAGNOSIS — Z720 Tobacco use: Secondary | ICD-10-CM

## 2022-11-07 DIAGNOSIS — C20 Malignant neoplasm of rectum: Secondary | ICD-10-CM

## 2022-11-07 LAB — COMPREHENSIVE METABOLIC PANEL
ALT: 32 U/L (ref 0–44)
AST: 29 U/L (ref 15–41)
Albumin: 4.1 g/dL (ref 3.5–5.0)
Alkaline Phosphatase: 85 U/L (ref 38–126)
Anion gap: 9 (ref 5–15)
BUN: 16 mg/dL (ref 6–20)
CO2: 25 mmol/L (ref 22–32)
Calcium: 9.1 mg/dL (ref 8.9–10.3)
Chloride: 99 mmol/L (ref 98–111)
Creatinine, Ser: 0.97 mg/dL (ref 0.44–1.00)
GFR, Estimated: >60 mL/min (ref >60)
Glucose, Bld: 196 mg/dL — ABNORMAL HIGH (ref 70–99)
Potassium: 4.2 mmol/L (ref 3.5–5.1)
Sodium: 133 mmol/L — ABNORMAL LOW (ref 135–145)
Total Bilirubin: 0.4 mg/dL (ref 0.3–1.2)
Total Protein: 7.5 g/dL (ref 6.5–8.1)

## 2022-11-07 LAB — CBC WITH DIFFERENTIAL/PLATELET
Abs Immature Granulocytes: 0.03 10*3/uL (ref 0.00–0.07)
Basophils Absolute: 0.1 10*3/uL (ref 0.0–0.1)
Basophils Relative: 1 %
Eosinophils Absolute: 0.1 10*3/uL (ref 0.0–0.5)
Eosinophils Relative: 1 %
HCT: 43.5 % (ref 36.0–46.0)
Hemoglobin: 15 g/dL (ref 12.0–15.0)
Immature Granulocytes: 1 %
Lymphocytes Relative: 12 %
Lymphs Abs: 0.7 10*3/uL (ref 0.7–4.0)
MCH: 34.6 pg — ABNORMAL HIGH (ref 26.0–34.0)
MCHC: 34.5 g/dL (ref 30.0–36.0)
MCV: 100.5 fL — ABNORMAL HIGH (ref 80.0–100.0)
Monocytes Absolute: 0.6 10*3/uL (ref 0.1–1.0)
Monocytes Relative: 10 %
Neutro Abs: 4.5 10*3/uL (ref 1.7–7.7)
Neutrophils Relative %: 75 %
Platelets: 119 10*3/uL — ABNORMAL LOW (ref 150–400)
RBC: 4.33 MIL/uL (ref 3.87–5.11)
RDW: 13.4 % (ref 11.5–15.5)
WBC: 5.9 10*3/uL (ref 4.0–10.5)
nRBC: 0 % (ref 0.0–0.2)

## 2022-11-07 MED ORDER — HEPARIN SOD (PORK) LOCK FLUSH 100 UNIT/ML IV SOLN
500.0000 [IU] | Freq: Once | INTRAVENOUS | Status: AC
  Administered 2022-11-07: 500 [IU] via INTRAVENOUS
  Filled 2022-11-07: qty 5

## 2022-11-07 MED ORDER — INFLUENZA VAC SPLIT QUAD 0.5 ML IM SUSY
0.5000 mL | PREFILLED_SYRINGE | Freq: Once | INTRAMUSCULAR | Status: AC
  Administered 2022-11-07: 0.5 mL via INTRAMUSCULAR
  Filled 2022-11-07: qty 0.5

## 2022-11-07 NOTE — Assessment & Plan Note (Signed)
Recommend patient to start antibiotics as directed by her PCP

## 2022-11-07 NOTE — Assessment & Plan Note (Signed)
Trending low, close monitor.

## 2022-11-07 NOTE — Progress Notes (Signed)
Hematology/Oncology Progress note [phone] [phone]) [phone]      ASSESSMENT & PLAN:   Cancer Staging  Rectal cancer [facility]) Staging form: Colon and Rectum, AJCC 8th Edition - Pathologic stage from 05/03/2022: Stage IIIB (pT3, pN1b, cM0) - Signed by [name], MD on 05/03/2022   Rectal cancer ([place]) Stage III. pT3 pN1b cM0, s/p APR. Currently on adjuvant chemotherapy. S/p FOLFOX x 4 cycles 09/12/22 S/p concurrent chemotherapy [Xeloda 1300 mg BID] with RT  Labs reviewed and discussed with patient Hold  FOLFOX due to UTI Dose reduced oxliplatin 53m/m2, omit 5-FU bolus   Tobacco use Recently quitted.  Encouraged her cessation effort.    Thrombocytopenia (HCC) Trending low, close monitor.  UTI (urinary tract infection) Recommend patient to start antibiotics as directed by her PCP  No orders of the defined types were placed in this encounter.   Follow-up  [facility] All questions were answered. The patient knows to call the clinic with any problems, questions or concerns.  ZEarlie Server MD, PhD [facility] 11/07/2022      CHIEF COMPLAINTS/REASON FOR VISIT:  Follow-up for rectal cancer treatments.  HISTORY OF PRESENTING ILLNESS:   April PLANTEis a  51y.o.  female presents for treatment of rectal cancer Oncology History  Rectal cancer ([place]  02/26/2022 Imaging   MRI PELVIS WITHOUT CONTRAST- By imaging, rectal cancer stage:  T1/T2, N0, Mx    03/02/2022 Imaging   CT CHEST AND ABDOMEN WITH CONTRAST 1. No convincing evidence of metastatic disease within the chest or abdomen. 2. Atrophic left kidney with multifocal renal scarring and cortical calcifications as well as nonobstructive renal stones measuring up to 5 mm. 3. Prominent left-sided predominant retroperitoneal lymph nodes measuring up to 8 mm near the level of the renal hilum, overall decreased in size dating back to CT September 19, 2018 and favored reactive  related to left renal inflammation. 4.  Aortic Atherosclerosis (ICD10-I70.0).   03/27/2022 Genetic Testing    [facility] CustomNext+RNA cancer panel found no pathogenic mutations.    04/21/2022 Initial Diagnosis   Rectal cancer - baseline CEA 3.6 -02/09/2022, patient had colonoscopy which showed renal mass 10 cm from anal verge.  5 mm polyp in ascending colon.  Removed and retrieved. Pathology showed rectal adenocarcinoma.  The polyp in the ascending colon is a tubular adenoma. -04/21/2022, patient underwent robotic assisted ultralow anterior resection. Pathology showed moderately differentiated adenocarcinoma, 4.5 cm in maximal extent, with focal extension through muscularis propria into perirectal soft tissue.  3 lymph nodes positive for metastatic carcinoma.  Negative margin.  pT3 pN1b, MSI stable.   05/03/2022 Cancer Staging   Staging form: Colon and Rectum, AJCC 8th Edition - Pathologic stage from 05/03/2022: Stage IIIB (pT3, pN1b, cM0) - Signed by YEarlie Server MD on 05/03/2022 Stage prefix: Initial diagnosis   05/11/2022 Miscellaneous   Medi port placed by Dr.White   05/26/2022 -  Chemotherapy   FOLFOX Q2 weeks x 4   05/26/2022 - 07/09/2022 Chemotherapy   Patient is on Treatment Plan : COLORECTAL FOLFOX q14d x 8 cycles     05/26/2022 -  Chemotherapy   Patient is on Treatment Plan : COLORECTAL FOLFOX q14d x 4 months     07/27/2022 -  Chemotherapy   Concurrent chemotherapy- Xeloda  13060mBID and radiation.    07/27/2022 - 09/12/2022 Radiation Therapy    concurrent chemotherapy [Xeloda 1300 mg BID] with RT    09/20/2022 Imaging   CT Angiogram chest  PET protocol There is no evidence of pulmonary artery embolism. There is no evidence of thoracic aortic dissection.   Small linear patchy alveolar infiltrate is seen in medial segment of right middle lobe suggesting atelectasis/pneumonia.     09/28/2022 - 09/30/2022 Hospital Admission   Admission due to acute respiratory failure with hypoxia, COPD  exacerbation   09/28/2022 Imaging   CT angio chest PE protocol 1. Negative for acute PE or thoracic aortic dissection. 2. New ground-glass infiltrates anteriorly in bilateral upper lobes,may represent atypical edema, infectious or inflammatory process. 3.  Aortic Atherosclerosis     Patient has bipolar and schizophrenia..  Patient is married and lives at home with her husband and son.  INTERVAL HISTORY April Luna is a 51 y.o. female who has above history reviewed by me today presents for follow up visit for rectal cancer.  She was hospitalized due to acute respiratory failure, pneumonia.  Intermittent constipation/diarrhea,manageable symptoms.  No new complaints.  Intermittent finger tip numbness/tingling. Stable.   Review of Systems  Constitutional:  Positive for fatigue. Negative for appetite change, chills and fever.  HENT:   Negative for hearing loss and voice change.   Eyes:  Negative for eye problems.  Respiratory:  Negative for chest tightness, cough and shortness of breath.   Cardiovascular:  Negative for chest pain.  Gastrointestinal:  Negative for abdominal distention, abdominal pain and blood in stool.       Diarrhea alternating with constipation.  Endocrine: Negative for hot flashes.  Genitourinary:  Positive for frequency. Negative for difficulty urinating.   Musculoskeletal:  Negative for arthralgias.  Skin:  Negative for itching and rash.  Neurological:  Positive for numbness. Negative for extremity weakness.  Hematological:  Negative for adenopathy.  Psychiatric/Behavioral:  Negative for confusion.     MEDICAL HISTORY:  Past Medical History:  Diagnosis Date   Allergy    pollen   Anxiety    Arthritis    right hip   Bipolar 1 disorder ([place])    Cancer (HCC)    rectal   Chemotherapy-induced neuropathy ([place]) 10/24/2022   Chronic kidney disease    COPD (chronic obstructive pulmonary disease) (HCC)    Depression    Family history of adverse reaction  to anesthesia    grand father had a stroke during anesthesia   Family history of breast cancer    Family history of colon cancer    Family history of uterine cancer    GERD (gastroesophageal reflux disease)    History of kidney stones    Hyperlipidemia    Hypertension    Hypothyroidism    Panic attack    Pneumonia    Psoriasis    Sleep apnea 08/11/2021   No CPAP   Type 2 diabetes mellitus with microalbuminuria, without long-term current use of insulin ([place]) 06/24/2019    SURGICAL HISTORY: Past Surgical History:  Procedure Laterality Date   BREAST BIOPSY Left 12/14/2021   [place] bx, venus marker, path pending   CESAREAN SECTION     COLONOSCOPY WITH PROPOFOL N/A 02/09/2022   Procedure: COLONOSCOPY WITH PROPOFOL;  Surgeon: Lin Landsman, MD;  Location: [facility];  Service: Endoscopy;  Laterality: N/A;  sleep apnea   CYSTOSCOPY W/ RETROGRADES Left 11/08/2018   Procedure: CYSTOSCOPY WITH RETROGRADE PYELOGRAM;  Surgeon: Billey Co, MD;  Location: [facility];  Service: Urology;  Laterality: Left;   CYSTOSCOPY/URETEROSCOPY/HOLMIUM LASER/STENT PLACEMENT Left 11/08/2018   Procedure: CYSTOSCOPY/URETEROSCOPY/HOLMIUM LASER/STENT PLACEMENT;  Surgeon: Billey Co, MD;  Location:  [facility];  Service: Urology;  Laterality: Left;   IR IMAGING GUIDED PORT INSERTION  05/11/2022   MOUTH SURGERY     wisdom teeth extraction   MOUTH SURGERY     teeth removal   POLYPECTOMY N/A 02/09/2022   Procedure: POLYPECTOMY;  Surgeon: Lin Landsman, MD;  Location: [facility];  Service: Endoscopy;  Laterality: N/A;   XI ROBOTIC ASSISTED LOWER ANTERIOR RESECTION N/A 04/21/2022   Procedure: XI ROBOTIC ASSISTED LOWER ANTERIOR RESECTION WITH COLOSTOMY, BILATERAL TAP BLOCK, ASSESSMENT OF TISSUE PERFUSSION WITH FIREFLY INJECTION;  Surgeon: Ileana Roup, MD;  Location: [facility];  Service: General;  Laterality: N/A;    SOCIAL HISTORY: Social History   Socioeconomic History    Marital status: Married    Spouse name: Montine Circle   Number of children: 1   Years of education: 12   Highest education level: High school graduate  Occupational History   Occupation: unemployed    Comment: disabled  Tobacco Use   Smoking status: Some Days    Packs/day: 0.25    Years: 34.00    Total pack years: 8.50    Types: Cigarettes    Start date: 05/13/1985   Smokeless tobacco: Never   Tobacco comments:    5-6 cigarettes weekly- 11/07/2022  Vaping Use   Vaping Use: Former   Start date: 05/25/2018   Quit date: 09/24/2018  Substance and Sexual Activity   Alcohol use: Not Currently    Alcohol/week: 4.0 standard drinks of alcohol    Types: 4 Glasses of wine per week    Comment: quit ETOH in Feb. 2023   Drug use: Not Currently    Types: Marijuana    Comment: 2 days ago   Sexual activity: Not Currently    Birth control/protection: Post-menopausal  Other Topics Concern   Not on file  Social History Narrative   Lives with husband and son    Social Determinants of Health   Financial Resource Strain: High Risk (08/25/2022)   Overall Financial Resource Strain (CARDIA)    Difficulty of Paying Living Expenses: Hard  Food Insecurity: No Food Insecurity (09/20/2022)   Hunger Vital Sign    Worried About Running Out of Food in the Last Year: Never true    [place] in the Last Year: Never true  Recent Concern: [place] Present (08/25/2022)   Hunger Vital Sign    Worried About Running Out of Food in the Last Year: Sometimes true    Ran Out of Food in the Last Year: Sometimes true  Transportation Needs: Unmet Transportation Needs (10/09/2022)   PRAPARE - Hydrologist (Medical): Yes    Lack of Transportation (Non-Medical): Yes  Physical Activity: Insufficiently Active (08/25/2022)   Exercise Vital Sign    Days of Exercise per Week: 1 day    Minutes of Exercise per Session: 10 min  Stress: Stress Concern Present  (08/25/2022)   [place]    Feeling of Stress : To some extent  Social Connections: Moderately Integrated (08/25/2022)   Social Connection and Isolation Panel [NHANES]    Frequency of Communication with Friends and Family: More than three times a week    Frequency of Social Gatherings with Friends and Family: Twice a week    Attends Religious Services: Never    Marine scientist or Organizations: No    Attends Archivist Meetings: 1 to  4 times per year    Marital Status: Married  Human resources officer Violence: Not At Risk (09/20/2022)   Humiliation, Afraid, Rape, and Kick questionnaire    Fear of Current or Ex-Partner: No    Emotionally Abused: No    Physically Abused: No    Sexually Abused: No    FAMILY HISTORY: Family History  Problem Relation Age of Onset   Depression Mother    Anxiety disorder Mother    Diabetes Mother    Hypertension Mother    Hyperlipidemia Mother    Cancer Mother    Uterine cancer Mother 68   Cervical cancer Mother 54   Colon cancer Father    Depression Brother    Anxiety disorder Brother    Cancer Maternal Aunt        unk types   Diabetes Mellitus II Maternal Grandmother    Hypercholesterolemia Maternal Grandmother    Breast cancer Maternal Grandmother    Cancer Paternal Grandmother    Diabetes Paternal Grandmother    Melanoma Paternal Grandmother    Stomach cancer Paternal Grandmother     ALLERGIES:  is allergic to metformin and related, nsaids, perphenazine, sulfa antibiotics, abilify [aripiprazole], and penicillins.  MEDICATIONS:  Current Outpatient Medications  Medication Sig Dispense Refill   ACCU-CHEK GUIDE test strip USE UP TO FOUR TIMES DAILY AS DIRECTED. 100 each 5   acetaminophen (TYLENOL) 325 MG tablet Take 650 mg by mouth every 6 (six) hours as needed for headache (pain).     albuterol (PROVENTIL) (2.5 MG/3ML) 0.083% nebulizer solution Take 3 mLs (2.5 mg  total) by nebulization every 6 (six) hours as needed for shortness of breath or wheezing. 75 mL 0   albuterol (VENTOLIN HFA) 108 (90 Base) MCG/ACT inhaler Inhale 2 puffs into the lungs every 6 (six) hours as needed for wheezing or shortness of breath. 1 each 5   amantadine (SYMMETREL) 100 MG capsule Take 100 mg by mouth 2 (two) times daily.     blood glucose meter kit and supplies Dispense based on patient and insurance preference. Use up to four times daily as directed. (FOR ICD-10 E10.9, E11.9). 1 each 0   Budeson-Glycopyrrol-Formoterol (BREZTRI AEROSPHERE) 160-9-4.8 MCG/ACT AERO Inhale 2 puffs into the lungs in the morning and at bedtime. 5.9 g 11   buPROPion (WELLBUTRIN XL) 300 MG 24 hr tablet Take 300 mg by mouth daily.     Cholecalciferol (VITAMIN D-3) 125 MCG (5000 UT) TABS Take 5,000 Units by mouth daily. 30 tablet 1   diltiazem (CARDIZEM CD) 180 MG 24 hr capsule Take 1 capsule (180 mg total) by mouth daily. 30 capsule 0   docusate sodium (COLACE) 100 MG capsule TAKE 1 CAPSULE BY MOUTH TWICE DAILY 60 capsule 1   FARXIGA 10 MG TABS tablet TAKE 1 TABLET BY MOUTH DAILY BEFORE BREAKFAST 30 tablet 0   gabapentin (NEURONTIN) 300 MG capsule Take 300 mg by mouth 2 (two) times daily.     hydrocortisone cream 0.5 % Apply 1 Application topically 2 (two) times daily as needed for itching. 30 g 1   Hydrocortisone, Perianal, 1 % CREA Apply topically.     hydrOXYzine (VISTARIL) 50 MG capsule Take 50 mg by mouth 3 (three) times daily as needed for anxiety.     icosapent Ethyl (VASCEPA) 1 g capsule TAKE 2 CAPSULES BY MOUTH 2 TIMES DAILY 120 capsule 3   insulin detemir (LEVEMIR FLEXPEN) 100 UNIT/ML FlexPen Inject 4-10 Units into the skin daily. 15 mL 0   Insulin Pen  Needle 32G X 4 MM MISC 1 each by Does not apply route daily at 12 noon. 100 each 0   ipratropium-albuterol (DUONEB) 0.5-2.5 (3) MG/3ML SOLN Take 3 mLs by nebulization every 6 (six) hours as needed. 360 mL 11   levothyroxine (SYNTHROID) 75 MCG  tablet TAKE 1 TABLET BY MOUTH DAILY BEFORE BREAKFAST 90 tablet 2   loratadine (CLARITIN) 10 MG tablet Take 10 mg by mouth every morning.     losartan (COZAAR) 25 MG tablet Take 1 tablet (25 mg total) by mouth at bedtime. 30 tablet 3   nicotine (NICODERM CQ - DOSED IN MG/24 HOURS) 21 mg/24hr patch Aplly 54m patch chest wall daily (okay to substitute generic) 28 patch 0   nicotine polacrilex (NICOTINE MINI) 2 MG lozenge Take 1 lozenge (2 mg total) by mouth every 2 (two) hours as needed for smoking cessation. 72 lozenge 3   nitrofurantoin, macrocrystal-monohydrate, (MACROBID) 100 MG capsule Take 1 capsule (100 mg total) by mouth 2 (two) times daily. 10 capsule 0   NON FORMULARY Pt uses a cpap nightly     paliperidone (INVEGA SUSTENNA) 234 MG/1.5ML SUSY injection Inject 234 mg into the muscle every 30 (thirty) days. On or about the 14th of each month     pantoprazole (PROTONIX) 40 MG tablet TAKE 1 TABLET BY MOUTH DAILY 90 tablet 2   Pediatric Multivit-Minerals-C (CHEWABLES MULTIVITAMIN PO) Take 1 tablet by mouth daily.     pioglitazone (ACTOS) 15 MG tablet Take 1 tablet (15 mg total) by mouth daily. 30 tablet 0   rosuvastatin (CRESTOR) 40 MG tablet Take 1 tablet (40 mg total) by mouth every morning. 30 tablet 3   sertraline (ZOLOFT) 100 MG tablet Take 200 mg by mouth every morning.     Spacer/Aero-Holding Chambers (AEROCHAMBER MV) inhaler Use as instructed 1 each 0   No current facility-administered medications for this visit.     PHYSICAL EXAMINATION: ECOG PERFORMANCE STATUS: 0 - Asymptomatic Vitals:   11/07/22 0915  BP: (!) 113/96  Temp: (!) 97.2 F (36.2 C)  SpO2: 98%    Filed Weights   11/07/22 0915  Weight: 133 lb 4.8 oz (60.5 kg)     Physical Exam Constitutional:      General: She is not in acute distress. HENT:     Head: Normocephalic and atraumatic.  Eyes:     General: No scleral icterus. Cardiovascular:     Rate and Rhythm: Normal rate and regular rhythm.     Heart  sounds: Normal heart sounds.  Pulmonary:     Effort: Pulmonary effort is normal. No respiratory distress.     Breath sounds: Wheezing present.     Comments: Decreased breath sound bilaterally Abdominal:     General: Bowel sounds are normal. There is no distension.     Palpations: Abdomen is soft.     Comments: Liquid stool in ostomy bag.  Musculoskeletal:        General: No deformity. Normal range of motion.     Cervical back: Normal range of motion and neck supple.  Skin:    General: Skin is warm and dry.     Findings: No erythema or rash.  Neurological:     Mental Status: She is alert and oriented to person, place, and time. Mental status is at baseline.     Cranial Nerves: No cranial nerve deficit.     Coordination: Coordination normal.  Psychiatric:        Mood and Affect: Mood normal.  LABORATORY DATA:  I have reviewed the data as listed    Latest Ref Rng & Units 11/07/2022    8:56 AM 10/24/2022    8:13 AM 10/10/2022    8:56 AM  CBC  WBC 4.0 - 10.5 K/uL 5.9  5.1  9.7   Hemoglobin 12.0 - 15.0 g/dL 15.0  13.6  12.7   Hematocrit 36.0 - 46.0 % 43.5  39.9  36.6   Platelets 150 - 400 K/uL 119  105  134       Latest Ref Rng & Units 11/07/2022    8:56 AM 10/24/2022    8:13 AM 10/10/2022    8:56 AM  CMP  Glucose 70 - 99 mg/dL 196  221  147   BUN 6 - 20 mg/dL _0 Creatinine 0.44 - 1.00 mg/dL 0.97  0.89  1.02   Sodium 135 - 145 mmol/L 133  136  138   Potassium 3.5 - 5.1 mmol/L 4.2  4.1  3.6   Chloride 98 - 111 mmol/L 99  103  103   CO2 22 - 32 mmol/L _1 Calcium 8.9 - 10.3 mg/dL 9.1  8.8  9.1   Total Protein 6.5 - 8.1 g/dL 7.5  7.2  6.9   Total Bilirubin 0.3 - 1.2 mg/dL 0.4  0.5  0.6   Alkaline Phos 38 - 126 U/L 85  93  78   AST 15 - 41 U/L 29  55  33   ALT 0 - 44 U/L 32  45  30         RADIOGRAPHIC STUDIES: I have personally reviewed the radiological images as listed and agreed with the findings in the report. No results found.

## 2022-11-07 NOTE — Assessment & Plan Note (Addendum)
Stage III. pT3 pN1b cM0, s/p APR. Currently on adjuvant chemotherapy. S/p FOLFOX x 4 cycles 09/12/22 S/p concurrent chemotherapy [Xeloda 1300 mg BID] with RT  Labs reviewed and discussed with patient Hold  FOLFOX due to UTI Dose reduced oxliplatin '75mg'$ /m2, omit 5-FU bolus

## 2022-11-07 NOTE — Progress Notes (Signed)
Patient is here for follow-up and Folfox treatment. Patient has a UTI. Patient states that she did a UA for PCP on Friday and they called her yesterday with results and antibiotic. She states that she has not picked it up yet so she is not sure what antibiotic it is. She has been very fatigued and has been sleeping a lot. She also believes that it is partly her depression.

## 2022-11-07 NOTE — Assessment & Plan Note (Deleted)
Chemotherapy plan as listed above 

## 2022-11-07 NOTE — Assessment & Plan Note (Signed)
Recently quitted.  Encouraged her cessation effort.

## 2022-11-08 LAB — CEA: CEA: 6.4 ng/mL — ABNORMAL HIGH (ref 0.0–4.7)

## 2022-11-09 NOTE — Progress Notes (Addendum)
Name: April Luna   MRN: [id number]    DOB: 19-Mar-1971   Date:11/10/2022       Progress Note  Subjective  Chief Complaint  Review Medications  HPI  DM: she states fasting still above 170, taking levemir 6 units daily, she is also taking Actos and [place] daily . She denies side effects. She is having polyphagia, polydipsia and polyuria. Last A1C was 7.4 % but unable to check it today ( too soon  and patient cancelled visit for Dec) She has associated dyslipidemia and HTN., CKI stage III  and microalbuminuria    COPD:  She has chronic cough and sputum that is yellow, she also has wheezing usually at night and SOB with activity. She also had a sleep study that was positive for OSA but now she said unable to tolerate CPAP while at [facility]. She quit smoking but resumed smoking again a few weeks ago.    HTN: bp towards low end of normal she is on losartan for her kidney protection also on cardizem since hospital discharge due to tachycardia. She denies chest pain, palpitation or dizziness.    Hyperlipidemia: she is now getting medication delivered pre-packaged to her house and has been more compliant, on crestor and vascepa Last LDL improved    GERD: currently no heartburn or indigestion , she is taking PPI, now seeing Dr. Marius Ditch , she stopped linzess due to colostomy pouch after rectal cancer . Unchanged    Bipolar disorder: she is seeing psychiatrist Dr. Loni Muse at [facility], she came in alone  Last admission was 2016 for a psychotic episode. No history of suicide attempts. She has been feeling more anxious lately    Hypothyroidism: she is taking levothyroxine and denies hair loss, no change in bowel movement, she has chronic dry skin. She states started to take levothyroxine due to lithium therapy years ago We will recheck labs next visit    Chronic right lower back pain: going on for years, she used to see chiropractor but cannot afford it,  seems to be right on top of sacro- iliac joint, she  states occasionally shoots down to her right lower leg. Taking gabapentin and prn tylenol and symptoms are stable. She has noticed some right upper back pain and sometimes has neck pain    Psoriasis: seen by dermatologist in the past, Dr. Koleen Nimrod, she is now getting otc shampoo and is doing well controlling scalp itching . Stable    Rectal cancer: found during screening colonoscopy, diagnosed 01/2022 , she had robotic resection April 2022 , had radiation therapy and chemo ,she still has two treatments to go. She has lack of appetite and weight loss . CEA was elevated, monitored by oncologist    Atherosclerosis of Aorta: taking statins and fish oil, continue medication. Last LDL was 71    Malnutrition: she has lost over 10 % of her weight since Oct 2022, weight was 151 lbs it went down to 130 lbs and today is 132 lbs, weight has stabilized over the past few months . Lack of appetite, she is taking Glucerna now    Patient Active Problem List   Diagnosis Date Noted   UTI (urinary tract infection) 11/07/2022   Chemotherapy-induced neuropathy ([place]) 10/24/2022   Tachycardia 09/22/2022   Acute respiratory failure with hypoxia ([place]) 09/21/2022   Lobar pneumonia ([place]) 09/21/2022   COPD with acute exacerbation ([place]) 09/20/2022   Atherosclerosis of aorta ([place]) 07/17/2022   Stage 3a chronic kidney disease ([place]) 07/17/2022  Thrombocytopenia ([place]) 06/24/2022   Tobacco use 05/26/2022   Encounter for antineoplastic chemotherapy 05/17/2022   Goals of care, counseling/discussion 05/03/2022   Rectal cancer ([place]) 04/21/2022   Genetic testing 03/27/2022   Family history of colon cancer 03/14/2022   Family history of uterine cancer 03/14/2022   Family history of breast cancer 03/14/2022   Polyp of ascending colon    Obstructive sleep apnea syndrome 08/11/2021   Plantar callus 10/06/2019   Acquired hallux limitus of both feet 10/06/2019   Uncontrolled type 2 diabetes mellitus with hyperglycemia, without  long-term current use of insulin ([place]) 06/24/2019   Chronic obstructive pulmonary disease ([place]) 06/24/2019   Chronic constipation 06/24/2019   ASCUS of cervix with negative high risk HPV 09/05/2018   GERD without esophagitis 04/11/2018   Hyperlipidemia 04/11/2018   Essential hypertension 04/11/2018   Hypothyroidism 04/26/2015   Bipolar I disorder, most recent episode (or current) manic ([place]) 04/25/2015   Delirium due to another medical condition 04/25/2015   Cannabis abuse 04/25/2015    Past Surgical History:  Procedure Laterality Date   BREAST BIOPSY Left 12/14/2021   [place] bx, venus marker, path pending   CESAREAN SECTION     COLONOSCOPY WITH PROPOFOL N/A 02/09/2022   Procedure: COLONOSCOPY WITH PROPOFOL;  Surgeon: Lin Landsman, MD;  Location: [place];  Service: Endoscopy;  Laterality: N/A;  sleep apnea   CYSTOSCOPY W/ RETROGRADES Left 11/08/2018   Procedure: CYSTOSCOPY WITH RETROGRADE PYELOGRAM;  Surgeon: Billey Co, MD;  Location: [facility];  Service: Urology;  Laterality: Left;   CYSTOSCOPY/URETEROSCOPY/HOLMIUM LASER/STENT PLACEMENT Left 11/08/2018   Procedure: CYSTOSCOPY/URETEROSCOPY/HOLMIUM LASER/STENT PLACEMENT;  Surgeon: Billey Co, MD;  Location: [facility];  Service: Urology;  Laterality: Left;   IR IMAGING GUIDED PORT INSERTION  05/11/2022   MOUTH SURGERY     wisdom teeth extraction   MOUTH SURGERY     teeth removal   POLYPECTOMY N/A 02/09/2022   Procedure: POLYPECTOMY;  Surgeon: Lin Landsman, MD;  Location: [place];  Service: Endoscopy;  Laterality: N/A;   XI ROBOTIC ASSISTED LOWER ANTERIOR RESECTION N/A 04/21/2022   Procedure: XI ROBOTIC ASSISTED LOWER ANTERIOR RESECTION WITH COLOSTOMY, BILATERAL TAP BLOCK, ASSESSMENT OF TISSUE PERFUSSION WITH FIREFLY INJECTION;  Surgeon: Ileana Roup, MD;  Location: [facility];  Service: General;  Laterality: N/A;    Family History  Problem Relation Age of Onset   Depression Mother     Anxiety disorder Mother    Diabetes Mother    Hypertension Mother    Hyperlipidemia Mother    Cancer Mother    Uterine cancer Mother 74   Cervical cancer Mother 74   Colon cancer Father    Depression Brother    Anxiety disorder Brother    Cancer Maternal Aunt        unk types   Diabetes Mellitus II Maternal Grandmother    Hypercholesterolemia Maternal Grandmother    Breast cancer Maternal Grandmother    Cancer Paternal Grandmother    Diabetes Paternal Grandmother    Melanoma Paternal Grandmother    Stomach cancer Paternal Grandmother     Social History   Tobacco Use   Smoking status: Some Days    Packs/day: 0.25    Years: 34.00    Total pack years: 8.50    Types: Cigarettes    Start date: 05/13/1985   Smokeless tobacco: Never   Tobacco comments:    5-6 cigarettes weekly- 11/07/2022  Substance Use Topics   Alcohol use: Not Currently  Alcohol/week: 4.0 standard drinks of alcohol    Types: 4 Glasses of wine per week    Comment: quit ETOH in Feb. 2023     Current Outpatient Medications:    ACCU-CHEK GUIDE test strip, USE UP TO FOUR TIMES DAILY AS DIRECTED., Disp: 100 each, Rfl: 5   acetaminophen (TYLENOL) 325 MG tablet, Take 650 mg by mouth every 6 (six) hours as needed for headache (pain)., Disp: , Rfl:    albuterol (VENTOLIN HFA) 108 (90 Base) MCG/ACT inhaler, Inhale 2 puffs into the lungs every 6 (six) hours as needed for wheezing or shortness of breath., Disp: 1 each, Rfl: 5   amantadine (SYMMETREL) 100 MG capsule, Take 100 mg by mouth 2 (two) times daily., Disp: , Rfl:    blood glucose meter kit and supplies, Dispense based on patient and insurance preference. Use up to four times daily as directed. (FOR ICD-10 E10.9, E11.9)., Disp: 1 each, Rfl: 0   Budeson-Glycopyrrol-Formoterol (BREZTRI AEROSPHERE) 160-9-4.8 MCG/ACT AERO, Inhale 2 puffs into the lungs in the morning and at bedtime., Disp: 5.9 g, Rfl: 11   buPROPion (WELLBUTRIN XL) 300 MG 24 hr tablet, Take 300 mg  by mouth daily., Disp: , Rfl:    Cholecalciferol (VITAMIN D-3) 125 MCG (5000 UT) TABS, Take 5,000 Units by mouth daily., Disp: 30 tablet, Rfl: 1   diltiazem (CARDIZEM CD) 180 MG 24 hr capsule, Take 1 capsule (180 mg total) by mouth daily., Disp: 30 capsule, Rfl: 0   docusate sodium (COLACE) 100 MG capsule, TAKE 1 CAPSULE BY MOUTH TWICE DAILY, Disp: 60 capsule, Rfl: 1   FARXIGA 10 MG TABS tablet, TAKE 1 TABLET BY MOUTH DAILY BEFORE BREAKFAST, Disp: 30 tablet, Rfl: 0   gabapentin (NEURONTIN) 300 MG capsule, Take 300 mg by mouth 2 (two) times daily., Disp: , Rfl:    hydrocortisone cream 0.5 %, Apply 1 Application topically 2 (two) times daily as needed for itching., Disp: 30 g, Rfl: 1   Hydrocortisone, Perianal, 1 % CREA, Apply topically., Disp: , Rfl:    hydrOXYzine (VISTARIL) 50 MG capsule, Take 50 mg by mouth 3 (three) times daily as needed for anxiety., Disp: , Rfl:    icosapent Ethyl (VASCEPA) 1 g capsule, TAKE 2 CAPSULES BY MOUTH 2 TIMES DAILY, Disp: 120 capsule, Rfl: 3   insulin detemir (LEVEMIR FLEXPEN) 100 UNIT/ML FlexPen, Inject 4-10 Units into the skin daily., Disp: 15 mL, Rfl: 0   Insulin Pen Needle 32G X 4 MM MISC, 1 each by Does not apply route daily at 12 noon., Disp: 100 each, Rfl: 0   ipratropium-albuterol (DUONEB) 0.5-2.5 (3) MG/3ML SOLN, Take 3 mLs by nebulization every 6 (six) hours as needed., Disp: 360 mL, Rfl: 11   levothyroxine (SYNTHROID) 75 MCG tablet, TAKE 1 TABLET BY MOUTH DAILY BEFORE BREAKFAST, Disp: 90 tablet, Rfl: 2   loratadine (CLARITIN) 10 MG tablet, Take 10 mg by mouth every morning., Disp: , Rfl:    losartan (COZAAR) 25 MG tablet, Take 1 tablet (25 mg total) by mouth at bedtime., Disp: 30 tablet, Rfl: 3   nicotine (NICODERM CQ - DOSED IN MG/24 HOURS) 21 mg/24hr patch, Aplly 84m patch chest wall daily (okay to substitute generic), Disp: 28 patch, Rfl: 0   nicotine polacrilex (NICOTINE MINI) 2 MG lozenge, Take 1 lozenge (2 mg total) by mouth every 2 (two) hours as  needed for smoking cessation., Disp: 72 lozenge, Rfl: 3   nitrofurantoin, macrocrystal-monohydrate, (MACROBID) 100 MG capsule, Take 1 capsule (100 mg total) by  mouth 2 (two) times daily., Disp: 10 capsule, Rfl: 0   NON FORMULARY, Pt uses a cpap nightly, Disp: , Rfl:    paliperidone (INVEGA SUSTENNA) 234 MG/1.5ML SUSY injection, Inject 234 mg into the muscle every 30 (thirty) days. On or about the 14th of each month, Disp: , Rfl:    pantoprazole (PROTONIX) 40 MG tablet, TAKE 1 TABLET BY MOUTH DAILY, Disp: 90 tablet, Rfl: 2   Pediatric Multivit-Minerals-C (CHEWABLES MULTIVITAMIN PO), Take 1 tablet by mouth daily., Disp: , Rfl:    pioglitazone (ACTOS) 15 MG tablet, Take 1 tablet (15 mg total) by mouth daily., Disp: 30 tablet, Rfl: 0   rosuvastatin (CRESTOR) 40 MG tablet, Take 1 tablet (40 mg total) by mouth every morning., Disp: 30 tablet, Rfl: 3   sertraline (ZOLOFT) 100 MG tablet, Take 200 mg by mouth every morning., Disp: , Rfl:    Spacer/Aero-Holding Chambers (AEROCHAMBER MV) inhaler, Use as instructed, Disp: 1 each, Rfl: 0   albuterol (PROVENTIL) (2.5 MG/3ML) 0.083% nebulizer solution, Take 3 mLs (2.5 mg total) by nebulization every 6 (six) hours as needed for shortness of breath or wheezing., Disp: 75 mL, Rfl: 0  Allergies  Allergen Reactions   Metformin And Related Nausea And Vomiting   Nsaids Other (See Comments)    Stage 3 CKD   Perphenazine Other (See Comments)    Tremors, muscle weakness, tongue swelling   Sulfa Antibiotics Other (See Comments)    GI distress    Abilify [Aripiprazole] Rash   Penicillins Rash    She has taken amoxicillin without problems    I personally reviewed active problem list, medication list, allergies, family history, social history, health maintenance with the patient/caregiver today.   ROS  Ten systems reviewed and is negative except as mentioned in HPI   Objective  Vitals:   11/10/22 1450 11/10/22 1507  BP: 116/88   Pulse: 100 94  Resp: 16    Temp: 98.3 F (36.8 C)   TempSrc: Oral   SpO2: 93%   Weight: 132 lb 11.2 oz (60.2 kg)   Height: _0  (1.499 m)     Body mass index is 26.8 kg/m.  Physical Exam  Constitutional: Patient appears well-developed and well-nourished. No distress.  HEENT: head atraumatic, normocephalic, pupils equal and reactive to light, neck supple Cardiovascular: Normal rate, regular rhythm and normal heart sounds.  No murmur heard. No BLE edema. Pulmonary/Chest: Effort normal , bilateral rhonchi.  No respiratory distress. Abdominal: Soft.  There is no tenderness. Psychiatric: Patient has a normal mood and affect. behavior is normal. Judgment and thought content normal.    PHQ2/9:    11/10/2022    2:52 PM 11/03/2022   10:00 AM 10/05/2022   10:12 AM 09/20/2022    8:59 AM 08/25/2022   10:15 AM  Depression screen PHQ 2/9  Decreased Interest 1 0 1 1 0  Down, Depressed, Hopeless _1 0  PHQ - 2 Score _2 0  Altered sleeping 1 0 _3 Tired, decreased energy 1 0 _4 Change in appetite 1 0 _5 Feeling bad or failure about yourself  0 0 0 0 0  Trouble concentrating 1 0 _6 Moving slowly or fidgety/restless 0 0 0 0 0  Suicidal thoughts 0 0 0 0 0  PHQ-9 Score _7 Difficult doing work/chores Somewhat difficult  Somewhat difficult Somewhat difficult     phq 9  is positive   Fall Risk:    11/10/2022    2:52 PM 11/03/2022    9:52 AM 10/05/2022   10:11 AM 09/20/2022    8:44 AM 08/25/2022    9:58 AM  Fall Risk   Falls in the past year? 0 0 0 0 0  Number falls in past yr:  0   0  Injury with Fall?  0   0  Risk for fall due to : _0   Follow up Falls prevention discussed;Education provided;Falls evaluation completed Falls prevention discussed Falls prevention discussed;Education provided;Falls evaluation completed Falls prevention discussed Falls prevention discussed      Functional Status Survey: Is the  patient deaf or have difficulty hearing?: No Does the patient have difficulty seeing, even when wearing glasses/contacts?: No Does the patient have difficulty concentrating, remembering, or making decisions?: No Does the patient have difficulty walking or climbing stairs?: No Does the patient have difficulty dressing or bathing?: No Does the patient have difficulty doing errands alone such as visiting a doctor's office or shopping?: No    Assessment & Plan  1. Type 2 diabetes mellitus with microalbuminuria, without long-term current use of insulin (HCC)  Increase insulin to 8 units today and keep going up by 2 units every 3 days to keep fasting sugar between 100-140   2. Moderate protein-calorie malnutrition (H[facility])  Contineu Glucerna   3. Bipolar I disorder, most recent episode (or current) manic ([place])  She needs to discuss anxiety with psychiatrist   4. CKI stage I   5. Rectal cancer [facility])  Keep follow up with oncologist   6. Mucopurulent chronic bronchitis ([place])  On Brezti and she thinks it helps   7. Atherosclerosis of aorta ([place])  On statin therapy   8. GERD without esophagitis   9. Anxiety   10. Obstructive sleep apnea syndrome  Not able to tolerate it   11. Tobacco dependence  She is trying to quit again  12. Hypothyroidism due to medication  Continue medication

## 2022-11-10 ENCOUNTER — Ambulatory Visit (INDEPENDENT_AMBULATORY_CARE_PROVIDER_SITE_OTHER): Payer: Medicare Other | Admitting: Family Medicine

## 2022-11-10 ENCOUNTER — Encounter: Payer: Self-pay | Admitting: Family Medicine

## 2022-11-10 VITALS — BP 116/88 | HR 94 | Temp 98.3°F | Resp 16 | Ht 59.0 in | Wt 132.7 lb

## 2022-11-10 DIAGNOSIS — K219 Gastro-esophageal reflux disease without esophagitis: Secondary | ICD-10-CM

## 2022-11-10 DIAGNOSIS — F3110 Bipolar disorder, current episode manic without psychotic features, unspecified: Secondary | ICD-10-CM

## 2022-11-10 DIAGNOSIS — E1129 Type 2 diabetes mellitus with other diabetic kidney complication: Secondary | ICD-10-CM | POA: Diagnosis not present

## 2022-11-10 DIAGNOSIS — E440 Moderate protein-calorie malnutrition: Secondary | ICD-10-CM | POA: Diagnosis not present

## 2022-11-10 DIAGNOSIS — I700 Atherosclerosis of aorta: Secondary | ICD-10-CM

## 2022-11-10 DIAGNOSIS — N181 Chronic kidney disease, stage 1: Secondary | ICD-10-CM

## 2022-11-10 DIAGNOSIS — F17200 Nicotine dependence, unspecified, uncomplicated: Secondary | ICD-10-CM

## 2022-11-10 DIAGNOSIS — J411 Mucopurulent chronic bronchitis: Secondary | ICD-10-CM

## 2022-11-10 DIAGNOSIS — F419 Anxiety disorder, unspecified: Secondary | ICD-10-CM

## 2022-11-10 DIAGNOSIS — R809 Proteinuria, unspecified: Secondary | ICD-10-CM

## 2022-11-10 DIAGNOSIS — G4733 Obstructive sleep apnea (adult) (pediatric): Secondary | ICD-10-CM

## 2022-11-10 DIAGNOSIS — C20 Malignant neoplasm of rectum: Secondary | ICD-10-CM

## 2022-11-10 DIAGNOSIS — E032 Hypothyroidism due to medicaments and other exogenous substances: Secondary | ICD-10-CM

## 2022-11-13 ENCOUNTER — Ambulatory Visit: Payer: Medicare Other | Admitting: Family Medicine

## 2022-11-14 ENCOUNTER — Inpatient Hospital Stay: Payer: Medicare Other

## 2022-11-14 NOTE — Progress Notes (Signed)
Nutrition Follow-up:  Patient with stage II adenocarcinoma of rectum.  S/p colostomy on 04/21/22. Patient on folfox.    Spoke with patient via phone for nutrition follow-up.  Patient reports that appetite is good, having more energy.  Reports that husband has been cooking more.  Drinks glucerna shakes 1-2 times a day.  Denies mouth sores.  Takes stool softner to help with constipation.  Has been trying to stay away from metal to help with taste alterations.     Medications: reviewed  Labs: reviewed  Anthropometrics:   Weight 132 lb 11.2 oz on 11/17 132 lb 3.2 oz on 10/18 132 lb 4.8 oz on 8/3 130 lb 1.6 oz on 7/24 134 lb 6.4 oz on 7/14 140 lb 6.4 oz on 6/16 142 lb on 6/2   NUTRITION DIAGNOSIS: Inadequate oral intake stable   INTERVENTION:  Continue oral nutrition supplements to maintain weight Continue well balanced diet with good sources of protein RD available as needed    MONITORING, EVALUATION, GOAL: weight trends, intake   NEXT VISIT: as needed  Rufina Kimery B. Zenia Resides, [place], [place] Registered Dietitian [phone]

## 2022-11-21 ENCOUNTER — Ambulatory Visit: Payer: Medicare Other

## 2022-11-21 ENCOUNTER — Other Ambulatory Visit: Payer: Self-pay | Admitting: Oncology

## 2022-11-21 ENCOUNTER — Other Ambulatory Visit: Payer: Self-pay | Admitting: Family Medicine

## 2022-11-21 ENCOUNTER — Ambulatory Visit: Payer: Medicare Other | Admitting: Oncology

## 2022-11-21 ENCOUNTER — Other Ambulatory Visit: Payer: Medicare Other

## 2022-11-21 DIAGNOSIS — C20 Malignant neoplasm of rectum: Secondary | ICD-10-CM

## 2022-11-22 ENCOUNTER — Other Ambulatory Visit: Payer: Self-pay | Admitting: Family Medicine

## 2022-11-22 ENCOUNTER — Telehealth: Payer: Self-pay | Admitting: Oncology

## 2022-11-22 NOTE — Telephone Encounter (Signed)
Several Attempts made to contact pt to notify her of updated appt information. Unable to lvm phone just rings out.

## 2022-11-24 MED FILL — Dexamethasone Sodium Phosphate Inj 100 MG/10ML: INTRAMUSCULAR | Qty: 1 | Status: AC

## 2022-11-27 ENCOUNTER — Inpatient Hospital Stay: Payer: Medicare Other

## 2022-11-27 ENCOUNTER — Ambulatory Visit: Payer: Medicare Other | Admitting: Family Medicine

## 2022-11-27 ENCOUNTER — Inpatient Hospital Stay (HOSPITAL_BASED_OUTPATIENT_CLINIC_OR_DEPARTMENT_OTHER): Payer: Medicare Other | Admitting: Oncology

## 2022-11-27 ENCOUNTER — Inpatient Hospital Stay: Payer: Medicare Other | Attending: Oncology

## 2022-11-27 ENCOUNTER — Encounter: Payer: Self-pay | Admitting: Oncology

## 2022-11-27 VITALS — BP 136/92 | HR 87 | Temp 96.7°F | Resp 18 | Wt 133.1 lb

## 2022-11-27 DIAGNOSIS — Z79899 Other long term (current) drug therapy: Secondary | ICD-10-CM | POA: Insufficient documentation

## 2022-11-27 DIAGNOSIS — F17210 Nicotine dependence, cigarettes, uncomplicated: Secondary | ICD-10-CM | POA: Diagnosis not present

## 2022-11-27 DIAGNOSIS — G4730 Sleep apnea, unspecified: Secondary | ICD-10-CM | POA: Diagnosis not present

## 2022-11-27 DIAGNOSIS — F209 Schizophrenia, unspecified: Secondary | ICD-10-CM | POA: Diagnosis not present

## 2022-11-27 DIAGNOSIS — Z87891 Personal history of nicotine dependence: Secondary | ICD-10-CM | POA: Diagnosis not present

## 2022-11-27 DIAGNOSIS — D696 Thrombocytopenia, unspecified: Secondary | ICD-10-CM | POA: Insufficient documentation

## 2022-11-27 DIAGNOSIS — Z794 Long term (current) use of insulin: Secondary | ICD-10-CM | POA: Diagnosis not present

## 2022-11-27 DIAGNOSIS — D122 Benign neoplasm of ascending colon: Secondary | ICD-10-CM | POA: Insufficient documentation

## 2022-11-27 DIAGNOSIS — F319 Bipolar disorder, unspecified: Secondary | ICD-10-CM | POA: Insufficient documentation

## 2022-11-27 DIAGNOSIS — N189 Chronic kidney disease, unspecified: Secondary | ICD-10-CM | POA: Insufficient documentation

## 2022-11-27 DIAGNOSIS — Z7984 Long term (current) use of oral hypoglycemic drugs: Secondary | ICD-10-CM | POA: Diagnosis not present

## 2022-11-27 DIAGNOSIS — I700 Atherosclerosis of aorta: Secondary | ICD-10-CM | POA: Insufficient documentation

## 2022-11-27 DIAGNOSIS — E785 Hyperlipidemia, unspecified: Secondary | ICD-10-CM | POA: Insufficient documentation

## 2022-11-27 DIAGNOSIS — C20 Malignant neoplasm of rectum: Secondary | ICD-10-CM

## 2022-11-27 DIAGNOSIS — Z5111 Encounter for antineoplastic chemotherapy: Secondary | ICD-10-CM

## 2022-11-27 DIAGNOSIS — K219 Gastro-esophageal reflux disease without esophagitis: Secondary | ICD-10-CM | POA: Diagnosis not present

## 2022-11-27 DIAGNOSIS — E1122 Type 2 diabetes mellitus with diabetic chronic kidney disease: Secondary | ICD-10-CM | POA: Diagnosis not present

## 2022-11-27 DIAGNOSIS — I129 Hypertensive chronic kidney disease with stage 1 through stage 4 chronic kidney disease, or unspecified chronic kidney disease: Secondary | ICD-10-CM | POA: Diagnosis not present

## 2022-11-27 DIAGNOSIS — G620 Drug-induced polyneuropathy: Secondary | ICD-10-CM

## 2022-11-27 DIAGNOSIS — E039 Hypothyroidism, unspecified: Secondary | ICD-10-CM | POA: Diagnosis not present

## 2022-11-27 DIAGNOSIS — Z720 Tobacco use: Secondary | ICD-10-CM

## 2022-11-27 DIAGNOSIS — T451X5A Adverse effect of antineoplastic and immunosuppressive drugs, initial encounter: Secondary | ICD-10-CM

## 2022-11-27 LAB — CBC WITH DIFFERENTIAL/PLATELET
Abs Immature Granulocytes: 0.04 10*3/uL (ref 0.00–0.07)
Basophils Absolute: 0.1 10*3/uL (ref 0.0–0.1)
Basophils Relative: 1 %
Eosinophils Absolute: 0.2 10*3/uL (ref 0.0–0.5)
Eosinophils Relative: 2 %
HCT: 41 % (ref 36.0–46.0)
Hemoglobin: 13.9 g/dL (ref 12.0–15.0)
Immature Granulocytes: 0 %
Lymphocytes Relative: 9 %
Lymphs Abs: 0.9 10*3/uL (ref 0.7–4.0)
MCH: 34 pg (ref 26.0–34.0)
MCHC: 33.9 g/dL (ref 30.0–36.0)
MCV: 100.2 fL — ABNORMAL HIGH (ref 80.0–100.0)
Monocytes Absolute: 0.6 10*3/uL (ref 0.1–1.0)
Monocytes Relative: 5 %
Neutro Abs: 8.6 10*3/uL — ABNORMAL HIGH (ref 1.7–7.7)
Neutrophils Relative %: 83 %
Platelets: 125 10*3/uL — ABNORMAL LOW (ref 150–400)
RBC: 4.09 MIL/uL (ref 3.87–5.11)
RDW: 13.1 % (ref 11.5–15.5)
WBC: 10.3 10*3/uL (ref 4.0–10.5)
nRBC: 0 % (ref 0.0–0.2)

## 2022-11-27 LAB — COMPREHENSIVE METABOLIC PANEL
ALT: 11 U/L (ref 0–44)
AST: 21 U/L (ref 15–41)
Albumin: 3.9 g/dL (ref 3.5–5.0)
Alkaline Phosphatase: 76 U/L (ref 38–126)
Anion gap: 10 (ref 5–15)
BUN: 11 mg/dL (ref 6–20)
CO2: 24 mmol/L (ref 22–32)
Calcium: 8.7 mg/dL — ABNORMAL LOW (ref 8.9–10.3)
Chloride: 102 mmol/L (ref 98–111)
Creatinine, Ser: 0.8 mg/dL (ref 0.44–1.00)
GFR, Estimated: >60 mL/min (ref >60)
Glucose, Bld: 147 mg/dL — ABNORMAL HIGH (ref 70–99)
Potassium: 4 mmol/L (ref 3.5–5.1)
Sodium: 136 mmol/L (ref 135–145)
Total Bilirubin: 0.4 mg/dL (ref 0.3–1.2)
Total Protein: 7 g/dL (ref 6.5–8.1)

## 2022-11-27 MED ORDER — LEUCOVORIN CALCIUM INJECTION 350 MG
400.0000 mg/m2 | Freq: Once | INTRAVENOUS | Status: AC
  Administered 2022-11-27: 632 mg via INTRAVENOUS
  Filled 2022-11-27: qty 31.6

## 2022-11-27 MED ORDER — DEXTROSE 5 % IV SOLN
Freq: Once | INTRAVENOUS | Status: AC
  Filled 2022-11-27: qty 250

## 2022-11-27 MED ORDER — DIPHENHYDRAMINE HCL 25 MG PO CAPS
50.0000 mg | ORAL_CAPSULE | Freq: Once | ORAL | Status: AC
  Administered 2022-11-27: 25 mg via ORAL
  Filled 2022-11-27: qty 2

## 2022-11-27 MED ORDER — SODIUM CHLORIDE 0.9 % IV SOLN
10.0000 mg | Freq: Once | INTRAVENOUS | Status: AC
  Administered 2022-11-27: 10 mg via INTRAVENOUS
  Filled 2022-11-27: qty 10
  Filled 2022-11-27: qty 1

## 2022-11-27 MED ORDER — SODIUM CHLORIDE 0.9 % IV SOLN
2400.0000 mg/m2 | INTRAVENOUS | Status: DC
  Administered 2022-11-27: 3800 mg via INTRAVENOUS
  Filled 2022-11-27: qty 76

## 2022-11-27 MED ORDER — FAMOTIDINE IN NACL 20-0.9 MG/50ML-% IV SOLN
20.0000 mg | Freq: Once | INTRAVENOUS | Status: AC
  Administered 2022-11-27: 20 mg via INTRAVENOUS
  Filled 2022-11-27: qty 50

## 2022-11-27 MED ORDER — OXALIPLATIN CHEMO INJECTION 100 MG/20ML
75.0000 mg/m2 | Freq: Once | INTRAVENOUS | Status: AC
  Administered 2022-11-27: 120 mg via INTRAVENOUS
  Filled 2022-11-27: qty 20

## 2022-11-27 MED ORDER — PALONOSETRON HCL INJECTION 0.25 MG/5ML
0.2500 mg | Freq: Once | INTRAVENOUS | Status: AC
  Administered 2022-11-27: 0.25 mg via INTRAVENOUS
  Filled 2022-11-27: qty 5

## 2022-11-27 MED ORDER — DIPHENHYDRAMINE HCL 25 MG PO CAPS: 25.0000 mg | ORAL_CAPSULE | Freq: Once | ORAL | Status: DC

## 2022-11-27 NOTE — Progress Notes (Signed)
Hematology/Oncology Progress note [phone] [phone]      ASSESSMENT & PLAN:   Cancer Staging  Rectal cancer [facility]) Staging form: Colon and Rectum, AJCC 8th Edition - Pathologic stage from 05/03/2022: Stage IIIB (pT3, pN1b, cM0) - Signed by [name], MD on 05/03/2022   Rectal cancer ([place]) Stage III. pT3 pN1b cM0, s/p APR. Currently on adjuvant chemotherapy. S/p FOLFOX x 4 cycles 09/12/22 S/p concurrent chemotherapy [Xeloda 1300 mg BID] with RT  Labs reviewed and discussed with patient Proceed with FOLFOX  Dose reduced oxliplatin 69m/m2, omit 5-FU bolus  Encounter for antineoplastic chemotherapy Chemotherapy plan as listed above  Tobacco use Recently quitted.  Encouraged her cessation effort.    Thrombocytopenia (HCC) Trending low, close monitor.  Chemotherapy-induced neuropathy (HCC) Grade 1, stabe  No orders of the defined types were placed in this encounter.   Follow-up  [facility] All questions were answered. The patient knows to call the clinic with any problems, questions or concerns.  ZEarlie Server MD, PhD [facility] 11/27/2022      CHIEF COMPLAINTS/REASON FOR VISIT:  Follow-up for rectal cancer treatments.  HISTORY OF PRESENTING ILLNESS:   April SANTERREis a  51y.o.  female presents for treatment of rectal cancer Oncology History  [place]  02/26/2022 Imaging   MRI PELVIS WITHOUT CONTRAST- By imaging, rectal cancer stage:  T1/T2, N0, Mx    03/02/2022 Imaging   CT CHEST AND ABDOMEN WITH CONTRAST 1. No convincing evidence of metastatic disease within the chest or abdomen. 2. Atrophic left kidney with multifocal renal scarring and cortical calcifications as well as nonobstructive renal stones measuring up to 5 mm. 3. Prominent left-sided predominant retroperitoneal lymph nodes measuring up to 8 mm near the level of the renal hilum, overall decreased in size dating back to CT  September 19, 2018 and favored reactive related to left renal inflammation. 4.  Aortic Atherosclerosis (ICD10-I70.0).   03/27/2022 Genetic Testing    [facility] CustomNext+RNA cancer panel found no pathogenic mutations.    04/21/2022 Initial Diagnosis   Rectal cancer - baseline CEA 3.6 -02/09/2022, patient had colonoscopy which showed renal mass 10 cm from anal verge.  5 mm polyp in ascending colon.  Removed and retrieved. Pathology showed rectal adenocarcinoma.  The polyp in the ascending colon is a tubular adenoma. -04/21/2022, patient underwent robotic assisted ultralow anterior resection. Pathology showed moderately differentiated adenocarcinoma, 4.5 cm in maximal extent, with focal extension through muscularis propria into perirectal soft tissue.  3 lymph nodes positive for metastatic carcinoma.  Negative margin.  pT3 pN1b, MSI stable.   05/03/2022 Cancer Staging   Staging form: Colon and Rectum, AJCC 8th Edition - Pathologic stage from 05/03/2022: Stage IIIB (pT3, pN1b, cM0) - Signed by YEarlie Server MD on 05/03/2022 Stage prefix: Initial diagnosis   05/11/2022 Miscellaneous   Medi port placed by Dr.White   05/26/2022 -  Chemotherapy   FOLFOX Q2 weeks x 4   05/26/2022 - 07/09/2022 Chemotherapy   Patient is on Treatment Plan : COLORECTAL FOLFOX q14d x 8 cycles     05/26/2022 -  Chemotherapy   Patient is on Treatment Plan : COLORECTAL FOLFOX q14d x 4 months     07/27/2022 -  Chemotherapy   Concurrent chemotherapy- Xeloda  13057mBID and radiation.    07/27/2022 - 09/12/2022 Radiation Therapy    concurrent chemotherapy [Xeloda 1300 mg BID] with RT    09/20/2022 Imaging   CT Angiogram chest PET  protocol There is no evidence of pulmonary artery embolism. There is no evidence of thoracic aortic dissection.   Small linear patchy alveolar infiltrate is seen in medial segment of right middle lobe suggesting atelectasis/pneumonia.     09/28/2022 - 09/30/2022 Hospital Admission   Admission due to acute  respiratory failure with hypoxia, COPD exacerbation   09/28/2022 Imaging   CT angio chest PE protocol 1. Negative for acute PE or thoracic aortic dissection. 2. New ground-glass infiltrates anteriorly in bilateral upper lobes,may represent atypical edema, infectious or inflammatory process. 3.  Aortic Atherosclerosis     Patient has bipolar and schizophrenia..  Patient is married and lives at home with her husband and son.  INTERVAL HISTORY April Luna is a 51 y.o. female who has above history reviewed by me today presents for follow up visit for rectal cancer.  She recently completed a course of Macrobid for UTI.  Physically she feels well.  Mentally she has had more life stressor recently, including she and her husband having new roommate in house, her mother's health is declining. Intermittent finger tip numbness/tingling. Stable.   Review of Systems  Constitutional:  Positive for fatigue. Negative for appetite change, chills and fever.  HENT:   Negative for hearing loss and voice change.   Eyes:  Negative for eye problems.  Respiratory:  Negative for chest tightness, cough and shortness of breath.   Cardiovascular:  Negative for chest pain.  Gastrointestinal:  Negative for abdominal distention, abdominal pain and blood in stool.       Diarrhea alternating with constipation.  Endocrine: Negative for hot flashes.  Genitourinary:  Positive for frequency. Negative for difficulty urinating.   Musculoskeletal:  Negative for arthralgias.  Skin:  Negative for itching and rash.  Neurological:  Positive for numbness. Negative for extremity weakness.  Hematological:  Negative for adenopathy.  Psychiatric/Behavioral:  Negative for confusion.     MEDICAL HISTORY:  Past Medical History:  Diagnosis Date   Allergy    pollen   Anxiety    Arthritis    right hip   Bipolar 1 disorder ([place])    Cancer (HCC)    rectal   Chemotherapy-induced neuropathy ([place]) 10/24/2022   Chronic  kidney disease    COPD (chronic obstructive pulmonary disease) (HCC)    Depression    Family history of adverse reaction to anesthesia    grand father had a stroke during anesthesia   Family history of breast cancer    Family history of colon cancer    Family history of uterine cancer    GERD (gastroesophageal reflux disease)    History of kidney stones    Hyperlipidemia    Hypertension    Hypothyroidism    Panic attack    Pneumonia    Psoriasis    Sleep apnea 08/11/2021   No CPAP   Type 2 diabetes mellitus with microalbuminuria, without long-term current use of insulin ([place]) 06/24/2019    SURGICAL HISTORY: Past Surgical History:  Procedure Laterality Date   BREAST BIOPSY Left 12/14/2021   [place] bx, venus marker, path pending   CESAREAN SECTION     COLONOSCOPY WITH PROPOFOL N/A 02/09/2022   Procedure: COLONOSCOPY WITH PROPOFOL;  Surgeon: Lin Landsman, MD;  Location: [place];  Service: Endoscopy;  Laterality: N/A;  sleep apnea   CYSTOSCOPY W/ RETROGRADES Left 11/08/2018   Procedure: CYSTOSCOPY WITH RETROGRADE PYELOGRAM;  Surgeon: Billey Co, MD;  Location: [facility];  Service: Urology;  Laterality: Left;  CYSTOSCOPY/URETEROSCOPY/HOLMIUM LASER/STENT PLACEMENT Left 11/08/2018   Procedure: CYSTOSCOPY/URETEROSCOPY/HOLMIUM LASER/STENT PLACEMENT;  Surgeon: Billey Co, MD;  Location: [facility];  Service: Urology;  Laterality: Left;   IR IMAGING GUIDED PORT INSERTION  05/11/2022   MOUTH SURGERY     wisdom teeth extraction   MOUTH SURGERY     teeth removal   POLYPECTOMY N/A 02/09/2022   Procedure: POLYPECTOMY;  Surgeon: Lin Landsman, MD;  Location: [place];  Service: Endoscopy;  Laterality: N/A;   XI ROBOTIC ASSISTED LOWER ANTERIOR RESECTION N/A 04/21/2022   Procedure: XI ROBOTIC ASSISTED LOWER ANTERIOR RESECTION WITH COLOSTOMY, BILATERAL TAP BLOCK, ASSESSMENT OF TISSUE PERFUSSION WITH FIREFLY INJECTION;  Surgeon: Ileana Roup, MD;   Location: [facility];  Service: General;  Laterality: N/A;    SOCIAL HISTORY: Social History   Socioeconomic History   Marital status: Married    Spouse name: Montine Circle   Number of children: 1   Years of education: 12   Highest education level: High school graduate  Occupational History   Occupation: unemployed    Comment: disabled  Tobacco Use   Smoking status: Some Days    Packs/day: 0.25    Years: 34.00    Total pack years: 8.50    Types: Cigarettes    Start date: 05/13/1985   Smokeless tobacco: Never   Tobacco comments:    5-6 cigarettes weekly- 11/07/2022  Vaping Use   Vaping Use: Former   Start date: 05/25/2018   Quit date: 09/24/2018  Substance and Sexual Activity   Alcohol use: Not Currently    Alcohol/week: 4.0 standard drinks of alcohol    Types: 4 Glasses of wine per week    Comment: quit ETOH in Feb. 2023   Drug use: Not Currently    Types: Marijuana    Comment: 2 days ago   Sexual activity: Not Currently    Birth control/protection: Post-menopausal  Other Topics Concern   Not on file  Social History Narrative   Lives with husband and son    Social Determinants of Health   Financial Resource Strain: High Risk (08/25/2022)   Overall Financial Resource Strain (CARDIA)    Difficulty of Paying Living Expenses: Hard  Food Insecurity: No Food Insecurity (09/20/2022)   Hunger Vital Sign    Worried About Running Out of Food in the Last Year: Never true    [place] in the Last Year: Never true  Recent Concern: [place] Present (08/25/2022)   Hunger Vital Sign    Worried About Running Out of Food in the Last Year: Sometimes true    Ran Out of Food in the Last Year: Sometimes true  Transportation Needs: Unmet Transportation Needs (11/27/2022)   PRAPARE - Transportation    Lack of Transportation (Medical): Yes    Lack of Transportation (Non-Medical): Yes  Physical Activity: Insufficiently Active (08/25/2022)   Exercise Vital Sign     Days of Exercise per Week: 1 day    Minutes of Exercise per Session: 10 min  Stress: Stress Concern Present (08/25/2022)   [place]    Feeling of Stress : To some extent  Social Connections: Moderately Integrated (08/25/2022)   Social Connection and Isolation Panel [NHANES]    Frequency of Communication with Friends and Family: More than three times a week    Frequency of Social Gatherings with Friends and Family: Twice a week    Attends Religious Services: Never  Active Member of Clubs or Organizations: No    Attends Archivist Meetings: 1 to 4 times per year    Marital Status: Married  Human resources officer Violence: Not At Risk (09/20/2022)   Humiliation, Afraid, Rape, and Kick questionnaire    Fear of Current or Ex-Partner: No    Emotionally Abused: No    Physically Abused: No    Sexually Abused: No    FAMILY HISTORY: Family History  Problem Relation Age of Onset   Depression Mother    Anxiety disorder Mother    Diabetes Mother    Hypertension Mother    Hyperlipidemia Mother    Cancer Mother    Uterine cancer Mother 54   Cervical cancer Mother 92   Colon cancer Father    Depression Brother    Anxiety disorder Brother    Cancer Maternal Aunt        unk types   Diabetes Mellitus II Maternal Grandmother    Hypercholesterolemia Maternal Grandmother    Breast cancer Maternal Grandmother    Cancer Paternal Grandmother    Diabetes Paternal Grandmother    Melanoma Paternal Grandmother    Stomach cancer Paternal Grandmother     ALLERGIES:  is allergic to metformin and related, nsaids, perphenazine, sulfa antibiotics, abilify [aripiprazole], and penicillins.  MEDICATIONS:  Current Outpatient Medications  Medication Sig Dispense Refill   ACCU-CHEK GUIDE test strip USE UP TO FOUR TIMES DAILY AS DIRECTED. 100 each 5   acetaminophen (TYLENOL) 325 MG tablet Take 650 mg by mouth every 6 (six) hours as  needed for headache (pain).     albuterol (PROVENTIL) (2.5 MG/3ML) 0.083% nebulizer solution Take 3 mLs (2.5 mg total) by nebulization every 6 (six) hours as needed for shortness of breath or wheezing. 75 mL 0   albuterol (VENTOLIN HFA) 108 (90 Base) MCG/ACT inhaler Inhale 2 puffs into the lungs every 6 (six) hours as needed for wheezing or shortness of breath. 1 each 5   amantadine (SYMMETREL) 100 MG capsule Take 100 mg by mouth 2 (two) times daily.     blood glucose meter kit and supplies Dispense based on patient and insurance preference. Use up to four times daily as directed. (FOR ICD-10 E10.9, E11.9). 1 each 0   Budeson-Glycopyrrol-Formoterol (BREZTRI AEROSPHERE) 160-9-4.8 MCG/ACT AERO Inhale 2 puffs into the lungs in the morning and at bedtime. 5.9 g 11   buPROPion (WELLBUTRIN XL) 300 MG 24 hr tablet Take 300 mg by mouth daily.     Cholecalciferol (VITAMIN D-3) 125 MCG (5000 UT) TABS Take 5,000 Units by mouth daily. 30 tablet 1   diltiazem (CARDIZEM CD) 180 MG 24 hr capsule TAKE 1 CAPSULE BY MOUTH DAILY 30 capsule 0   docusate sodium (COLACE) 100 MG capsule TAKE 1 CAPSULE BY MOUTH TWICE DAILY 60 capsule 1   FARXIGA 10 MG TABS tablet TAKE 1 TABLET BY MOUTH DAILY BEFORE BREAKFAST 30 tablet 0   gabapentin (NEURONTIN) 300 MG capsule Take 300 mg by mouth 2 (two) times daily.     hydrocortisone cream 0.5 % Apply 1 Application topically 2 (two) times daily as needed for itching. 30 g 1   Hydrocortisone, Perianal, 1 % CREA Apply topically.     hydrOXYzine (VISTARIL) 50 MG capsule Take 50 mg by mouth 3 (three) times daily as needed for anxiety.     icosapent Ethyl (VASCEPA) 1 g capsule TAKE 2 CAPSULES BY MOUTH 2 TIMES DAILY 120 capsule 3   insulin detemir (LEVEMIR FLEXPEN) 100 UNIT/ML FlexPen  Inject 4-10 Units into the skin daily. 15 mL 0   Insulin Pen Needle 32G X 4 MM MISC 1 each by Does not apply route daily at 12 noon. 100 each 0   ipratropium-albuterol (DUONEB) 0.5-2.5 (3) MG/3ML SOLN Take 3 mLs  by nebulization every 6 (six) hours as needed. 360 mL 11   levothyroxine (SYNTHROID) 75 MCG tablet TAKE 1 TABLET BY MOUTH DAILY BEFORE BREAKFAST 90 tablet 2   loratadine (CLARITIN) 10 MG tablet Take 10 mg by mouth every morning.     losartan (COZAAR) 25 MG tablet Take 1 tablet (25 mg total) by mouth at bedtime. 30 tablet 3   nicotine (NICODERM CQ - DOSED IN MG/24 HOURS) 21 mg/24hr patch Aplly 26m patch chest wall daily (okay to substitute generic) 28 patch 0   nicotine polacrilex (NICOTINE MINI) 2 MG lozenge Take 1 lozenge (2 mg total) by mouth every 2 (two) hours as needed for smoking cessation. 72 lozenge 3   nitrofurantoin, macrocrystal-monohydrate, (MACROBID) 100 MG capsule Take 1 capsule (100 mg total) by mouth 2 (two) times daily. 10 capsule 0   NON FORMULARY Pt uses a cpap nightly     paliperidone (INVEGA SUSTENNA) 234 MG/1.5ML SUSY injection Inject 234 mg into the muscle every 30 (thirty) days. On or about the 14th of each month     pantoprazole (PROTONIX) 40 MG tablet TAKE 1 TABLET BY MOUTH DAILY 90 tablet 2   Pediatric Multivit-Minerals-C (CHEWABLES MULTIVITAMIN PO) Take 1 tablet by mouth daily.     pioglitazone (ACTOS) 15 MG tablet Take 1 tablet (15 mg total) by mouth daily. 30 tablet 0   rosuvastatin (CRESTOR) 40 MG tablet Take 1 tablet (40 mg total) by mouth every morning. 30 tablet 3   sertraline (ZOLOFT) 100 MG tablet Take 200 mg by mouth every morning.     Spacer/Aero-Holding Chambers (AEROCHAMBER MV) inhaler Use as instructed 1 each 0   No current facility-administered medications for this visit.   Facility-Administered Medications Ordered in Other Visits  Medication Dose Route Frequency Provider Last Rate Last Admin   fluorouracil (ADRUCIL) 3,800 mg in sodium chloride 0.9 % 74 mL chemo infusion  2,400 mg/m2 (Treatment Plan Recorded) Intravenous 1 day or 1 dose YEarlie Server MD       leucovorin 632 mg in dextrose 5 % 250 mL infusion  400 mg/m2 (Treatment Plan Recorded) Intravenous  Once YEarlie Server MD 141 mL/hr at 11/27/22 1103 632 mg at 11/27/22 1103   oxaliplatin (ELOXATIN) 120 mg in dextrose 5 % 500 mL chemo infusion  75 mg/m2 (Treatment Plan Recorded) Intravenous Once YEarlie Server MD 262 mL/hr at 11/27/22 1105 120 mg at 11/27/22 1105     PHYSICAL EXAMINATION: ECOG PERFORMANCE STATUS: 0 - Asymptomatic Vitals:   11/27/22 0907  BP: (!) 136/92  Pulse: 87  Resp: 18  Temp: (!) 96.7 F (35.9 C)    Filed Weights   11/27/22 0907  Weight: 133 lb 1.6 oz (60.4 kg)     Physical Exam Constitutional:      General: She is not in acute distress. HENT:     Head: Normocephalic and atraumatic.  Eyes:     General: No scleral icterus. Cardiovascular:     Rate and Rhythm: Normal rate and regular rhythm.     Heart sounds: Normal heart sounds.  Pulmonary:     Effort: Pulmonary effort is normal. No respiratory distress.     Breath sounds: Wheezing present.     Comments: Decreased breath sound bilaterally  Abdominal:     General: Bowel sounds are normal. There is no distension.     Palpations: Abdomen is soft.     Comments: Liquid stool in ostomy bag.  Musculoskeletal:        General: No deformity. Normal range of motion.     Cervical back: Normal range of motion and neck supple.  Skin:    General: Skin is warm and dry.     Findings: No erythema or rash.  Neurological:     Mental Status: She is alert and oriented to person, place, and time. Mental status is at baseline.     Cranial Nerves: No cranial nerve deficit.     Coordination: Coordination normal.  Psychiatric:        Mood and Affect: Mood normal.     LABORATORY DATA:  I have reviewed the data as listed    Latest Ref Rng & Units 11/27/2022    8:56 AM 11/07/2022    8:56 AM 10/24/2022    8:13 AM  CBC  WBC 4.0 - 10.5 K/uL 10.3  5.9  5.1   Hemoglobin 12.0 - 15.0 g/dL 13.9  15.0  13.6   Hematocrit 36.0 - 46.0 % 41.0  43.5  39.9   Platelets 150 - 400 K/uL 125  119  105       Latest Ref Rng & Units  11/27/2022    8:56 AM 11/07/2022    8:56 AM 10/24/2022    8:13 AM  CMP  Glucose 70 - 99 mg/dL 147  196  221   BUN 6 - 20 mg/dL _0 Creatinine 0.44 - 1.00 mg/dL 0.80  0.97  0.89   Sodium 135 - 145 mmol/L 136  133  136   Potassium 3.5 - 5.1 mmol/L 4.0  4.2  4.1   Chloride 98 - 111 mmol/L 102  99  103   CO2 22 - 32 mmol/L _1 Calcium 8.9 - 10.3 mg/dL 8.7  9.1  8.8   Total Protein 6.5 - 8.1 g/dL 7.0  7.5  7.2   Total Bilirubin 0.3 - 1.2 mg/dL 0.4  0.4  0.5   Alkaline Phos 38 - 126 U/L 76  85  93   AST 15 - 41 U/L 21  29  55   ALT 0 - 44 U/L 11  32  45         RADIOGRAPHIC STUDIES: I have personally reviewed the radiological images as listed and agreed with the findings in the report. No results found.

## 2022-11-27 NOTE — Assessment & Plan Note (Signed)
Grade 1, stabe

## 2022-11-27 NOTE — Patient Instructions (Signed)
[facility] AT [place]  Discharge Instructions: Thank you for choosing [facility] to provide your oncology and hematology care.  If you have a lab appointment with the [facility], please go directly to the [facility] and check in at the registration area.  Wear comfortable clothing and clothing appropriate for easy access to any Portacath or PICC line.   We strive to give you quality time with your provider. You may need to reschedule your appointment if you arrive late (15 or more minutes).  Arriving late affects you and other patients whose appointments are after yours.  Also, if you miss three or more appointments without notifying the office, you may be dismissed from the clinic at the provider's discretion.      For prescription refill requests, have your pharmacy contact our office and allow 72 hours for refills to be completed.    Today you received the following chemotherapy and/or immunotherapy agents- oxaliplatin, leucovorin, 5FU      To help prevent nausea and vomiting after your treatment, we encourage you to take your nausea medication as directed.  BELOW ARE SYMPTOMS THAT SHOULD BE REPORTED IMMEDIATELY: *FEVER GREATER THAN 100.4 F (38 C) OR HIGHER *CHILLS OR SWEATING *NAUSEA AND VOMITING THAT IS NOT CONTROLLED WITH YOUR NAUSEA MEDICATION *UNUSUAL SHORTNESS OF BREATH *UNUSUAL BRUISING OR BLEEDING *URINARY PROBLEMS (pain or burning when urinating, or frequent urination) *BOWEL PROBLEMS (unusual diarrhea, constipation, pain near the anus) TENDERNESS IN MOUTH AND THROAT WITH OR WITHOUT PRESENCE OF ULCERS (sore throat, sores in mouth, or a toothache) UNUSUAL RASH, SWELLING OR PAIN  UNUSUAL VAGINAL DISCHARGE OR ITCHING   Items with * indicate a potential emergency and should be followed up as soon as possible or go to the Emergency Department if any problems should occur.  Please show the CHEMOTHERAPY ALERT CARD or IMMUNOTHERAPY ALERT  CARD at check-in to the Emergency Department and triage nurse.  Should you have questions after your visit or need to cancel or reschedule your appointment, please contact [facility] [place] AT [place]  [phone] and follow the prompts.  Office hours are 8:00 a.m. to 4:30 p.m. Monday - Friday. Please note that voicemails left after 4:00 p.m. may not be returned until the following business day.  We are closed weekends and major holidays. You have access to a nurse at all times for urgent questions. Please call the main number to the clinic [phone] and follow the prompts.  For any non-urgent questions, you may also contact your provider using MyChart. We now offer e-Visits for anyone 42 and older to request care online for non-urgent symptoms. For details visit [email or web address].   Also download the MyChart app! Go to the app store, search "MyChart", open the app, select [place], and log in with your MyChart username and password.  Masks are optional in the cancer centers. If you would like for your care team to wear a mask while they are taking care of you, please let them know. For doctor visits, patients may have with them one support person who is at least 50 years old. At this time, visitors are not allowed in the infusion area.

## 2022-11-27 NOTE — Assessment & Plan Note (Signed)
Trending low, close monitor.

## 2022-11-27 NOTE — Assessment & Plan Note (Signed)
Chemotherapy plan as listed above 

## 2022-11-27 NOTE — Assessment & Plan Note (Signed)
Recently quitted.  Encouraged her cessation effort.

## 2022-11-27 NOTE — Progress Notes (Signed)
Pt here for follow up. Pt reports that she has been on Macrobid for bladder infection.

## 2022-11-27 NOTE — Assessment & Plan Note (Addendum)
Stage III. pT3 pN1b cM0, s/p APR. Currently on adjuvant chemotherapy. S/p FOLFOX x 4 cycles 09/12/22 S/p concurrent chemotherapy [Xeloda 1300 mg BID] with RT  Labs reviewed and discussed with patient Proceed with FOLFOX  Dose reduced oxliplatin '75mg'$ /m2, omit 5-FU bolus

## 2022-11-28 LAB — CEA: CEA: 3.7 ng/mL (ref 0.0–4.7)

## 2022-11-29 ENCOUNTER — Inpatient Hospital Stay: Payer: Medicare Other

## 2022-11-29 DIAGNOSIS — C20 Malignant neoplasm of rectum: Secondary | ICD-10-CM

## 2022-11-29 MED ORDER — SODIUM CHLORIDE 0.9% FLUSH
10.0000 mL | INTRAVENOUS | Status: DC | PRN
  Administered 2022-11-29: 10 mL
  Filled 2022-11-29: qty 10

## 2022-11-29 MED ORDER — HEPARIN SOD (PORK) LOCK FLUSH 100 UNIT/ML IV SOLN
500.0000 [IU] | Freq: Once | INTRAVENOUS | Status: AC | PRN
  Administered 2022-11-29: 500 [IU]
  Filled 2022-11-29: qty 5

## 2022-12-01 ENCOUNTER — Other Ambulatory Visit: Payer: Self-pay | Admitting: Family Medicine

## 2022-12-01 DIAGNOSIS — E785 Hyperlipidemia, unspecified: Secondary | ICD-10-CM

## 2022-12-01 DIAGNOSIS — E1129 Type 2 diabetes mellitus with other diabetic kidney complication: Secondary | ICD-10-CM

## 2022-12-05 ENCOUNTER — Ambulatory Visit: Payer: Medicare Other

## 2022-12-05 ENCOUNTER — Ambulatory Visit: Payer: Medicare Other | Admitting: Oncology

## 2022-12-05 ENCOUNTER — Other Ambulatory Visit: Payer: Medicare Other

## 2022-12-08 MED FILL — Dexamethasone Sodium Phosphate Inj 100 MG/10ML: INTRAMUSCULAR | Qty: 1 | Status: AC

## 2022-12-11 ENCOUNTER — Encounter: Payer: Self-pay | Admitting: Oncology

## 2022-12-11 ENCOUNTER — Inpatient Hospital Stay: Payer: Medicare Other

## 2022-12-11 ENCOUNTER — Inpatient Hospital Stay (HOSPITAL_BASED_OUTPATIENT_CLINIC_OR_DEPARTMENT_OTHER): Payer: Medicare Other | Admitting: Oncology

## 2022-12-11 VITALS — HR 99

## 2022-12-11 VITALS — BP 109/80 | HR 111 | Temp 96.4°F | Wt 132.4 lb

## 2022-12-11 DIAGNOSIS — Z5111 Encounter for antineoplastic chemotherapy: Secondary | ICD-10-CM | POA: Diagnosis not present

## 2022-12-11 DIAGNOSIS — D696 Thrombocytopenia, unspecified: Secondary | ICD-10-CM | POA: Diagnosis not present

## 2022-12-11 DIAGNOSIS — C20 Malignant neoplasm of rectum: Secondary | ICD-10-CM

## 2022-12-11 DIAGNOSIS — T451X5A Adverse effect of antineoplastic and immunosuppressive drugs, initial encounter: Secondary | ICD-10-CM

## 2022-12-11 DIAGNOSIS — R7989 Other specified abnormal findings of blood chemistry: Secondary | ICD-10-CM

## 2022-12-11 DIAGNOSIS — G620 Drug-induced polyneuropathy: Secondary | ICD-10-CM | POA: Diagnosis not present

## 2022-12-11 DIAGNOSIS — Z720 Tobacco use: Secondary | ICD-10-CM

## 2022-12-11 LAB — CBC WITH DIFFERENTIAL/PLATELET
Abs Immature Granulocytes: 0.01 10*3/uL (ref 0.00–0.07)
Basophils Absolute: 0 10*3/uL (ref 0.0–0.1)
Basophils Relative: 1 %
Eosinophils Absolute: 0.1 10*3/uL (ref 0.0–0.5)
Eosinophils Relative: 4 %
HCT: 40.2 % (ref 36.0–46.0)
Hemoglobin: 13.7 g/dL (ref 12.0–15.0)
Immature Granulocytes: 0 %
Lymphocytes Relative: 9 %
Lymphs Abs: 0.4 10*3/uL — ABNORMAL LOW (ref 0.7–4.0)
MCH: 33.7 pg (ref 26.0–34.0)
MCHC: 34.1 g/dL (ref 30.0–36.0)
MCV: 98.8 fL (ref 80.0–100.0)
Monocytes Absolute: 0.7 10*3/uL (ref 0.1–1.0)
Monocytes Relative: 19 %
Neutro Abs: 2.5 10*3/uL (ref 1.7–7.7)
Neutrophils Relative %: 67 %
Platelets: 105 10*3/uL — ABNORMAL LOW (ref 150–400)
RBC: 4.07 MIL/uL (ref 3.87–5.11)
RDW: 13.3 % (ref 11.5–15.5)
WBC: 3.7 10*3/uL — ABNORMAL LOW (ref 4.0–10.5)
nRBC: 0 % (ref 0.0–0.2)

## 2022-12-11 LAB — COMPREHENSIVE METABOLIC PANEL
ALT: 14 U/L (ref 0–44)
AST: 27 U/L (ref 15–41)
Albumin: 3.8 g/dL (ref 3.5–5.0)
Alkaline Phosphatase: 72 U/L (ref 38–126)
Anion gap: 12 (ref 5–15)
BUN: 9 mg/dL (ref 6–20)
CO2: 22 mmol/L (ref 22–32)
Calcium: 8.6 mg/dL — ABNORMAL LOW (ref 8.9–10.3)
Chloride: 103 mmol/L (ref 98–111)
Creatinine, Ser: 1.14 mg/dL — ABNORMAL HIGH (ref 0.44–1.00)
GFR, Estimated: 59 mL/min — ABNORMAL LOW (ref >60)
Glucose, Bld: 207 mg/dL — ABNORMAL HIGH (ref 70–99)
Potassium: 3.6 mmol/L (ref 3.5–5.1)
Sodium: 137 mmol/L (ref 135–145)
Total Bilirubin: 0.2 mg/dL — ABNORMAL LOW (ref 0.3–1.2)
Total Protein: 7.2 g/dL (ref 6.5–8.1)

## 2022-12-11 MED ORDER — HEPARIN SOD (PORK) LOCK FLUSH 100 UNIT/ML IV SOLN
500.0000 [IU] | Freq: Once | INTRAVENOUS | Status: DC | PRN
  Filled 2022-12-11: qty 5

## 2022-12-11 MED ORDER — DEXTROSE 5 % IV SOLN
Freq: Once | INTRAVENOUS | Status: AC
  Filled 2022-12-11: qty 250

## 2022-12-11 MED ORDER — LEUCOVORIN CALCIUM INJECTION 350 MG
400.0000 mg/m2 | Freq: Once | INTRAVENOUS | Status: AC
  Administered 2022-12-11: 632 mg via INTRAVENOUS
  Filled 2022-12-11: qty 31.6

## 2022-12-11 MED ORDER — PALONOSETRON HCL INJECTION 0.25 MG/5ML
0.2500 mg | Freq: Once | INTRAVENOUS | Status: AC
  Administered 2022-12-11: 0.25 mg via INTRAVENOUS
  Filled 2022-12-11: qty 5

## 2022-12-11 MED ORDER — SODIUM CHLORIDE 0.9 % IV SOLN
10.0000 mg | Freq: Once | INTRAVENOUS | Status: AC
  Administered 2022-12-11: 10 mg via INTRAVENOUS
  Filled 2022-12-11: qty 10

## 2022-12-11 MED ORDER — SODIUM CHLORIDE 0.9 % IV SOLN
INTRAVENOUS | Status: DC
  Filled 2022-12-11 (×2): qty 250

## 2022-12-11 MED ORDER — OXALIPLATIN CHEMO INJECTION 100 MG/20ML
75.0000 mg/m2 | Freq: Once | INTRAVENOUS | Status: AC
  Administered 2022-12-11: 120 mg via INTRAVENOUS
  Filled 2022-12-11: qty 20

## 2022-12-11 MED ORDER — DIPHENHYDRAMINE HCL 25 MG PO CAPS
25.0000 mg | ORAL_CAPSULE | Freq: Once | ORAL | Status: AC
  Administered 2022-12-11: 25 mg via ORAL
  Filled 2022-12-11: qty 1

## 2022-12-11 MED ORDER — FAMOTIDINE IN NACL 20-0.9 MG/50ML-% IV SOLN
20.0000 mg | Freq: Once | INTRAVENOUS | Status: AC
  Administered 2022-12-11: 20 mg via INTRAVENOUS
  Filled 2022-12-11: qty 50

## 2022-12-11 MED ORDER — SODIUM CHLORIDE 0.9 % IV SOLN
2400.0000 mg/m2 | INTRAVENOUS | Status: DC
  Administered 2022-12-11: 3800 mg via INTRAVENOUS
  Filled 2022-12-11: qty 76

## 2022-12-11 NOTE — Assessment & Plan Note (Addendum)
Stage III. pT3 pN1b cM0, s/p APR. Currently on adjuvant chemotherapy. S/p FOLFOX x 4 cycles 09/12/22 S/p concurrent chemotherapy [Xeloda 1300 mg BID] with RT  Labs reviewed and discussed with patient Proceed with FOLFOX  Dose reduced oxliplatin '75mg'$ /m2, omit 5-FU bolus She is finishing adjuvant chemotherapy.  Repeat CT chest abdomen pelvis in Jan 2024.

## 2022-12-11 NOTE — Progress Notes (Signed)
Hematology/Oncology Progress note [phone] [phone]      ASSESSMENT & PLAN:   Cancer Staging  Rectal cancer [facility]) Staging form: Colon and Rectum, AJCC 8th Edition - Pathologic stage from 05/03/2022: Stage IIIB (pT3, pN1b, cM0) - Signed by [name], MD on 05/03/2022   Rectal cancer ([place]) Stage III. pT3 pN1b cM0, s/p APR. Currently on adjuvant chemotherapy. S/p FOLFOX x 4 cycles 09/12/22 S/p concurrent chemotherapy [Xeloda 1300 mg BID] with RT  Labs reviewed and discussed with patient Proceed with FOLFOX  Dose reduced oxliplatin 83m/m2, omit 5-FU bolus She is finishing adjuvant chemotherapy.  Repeat CT chest abdomen pelvis in Jan 2024.    Chemotherapy-induced neuropathy (HCC) Grade 1, stabe  Encounter for antineoplastic chemotherapy Chemotherapy plan as listed above  Thrombocytopenia (HCC) Trending low, close monitor.  Tobacco use Recently quitted.  Encouraged her cessation effort.    Elevated serum creatinine IVF 1L of NS today.  Repeat IVF on D3  Recheck BMP in 1 week.   Orders Placed This Encounter  Procedures   CT CHEST ABDOMEN PELVIS W CONTRAST    Standing Status:   Future    Standing Expiration Date:   12/12/2023    Order Specific Question:   If indicated for the ordered procedure, I authorize the administration of contrast media per Radiology protocol    Answer:   Yes    Order Specific Question:   Does the patient have a contrast media/X-ray dye allergy?    Answer:   No    Order Specific Question:   Is patient pregnant?    Answer:   No    Order Specific Question:   Preferred imaging location?    Answer:   [facility]    Order Specific Question:   Is Oral Contrast requested for this exam?    Answer:   Yes, Per Radiology protocol   CBC with Differential/Platelet    Standing Status:   Future    Standing Expiration Date:   12/11/2023   Comprehensive metabolic panel    Standing Status:   Future    Standing Expiration Date:    12/11/2023   CEA    Standing Status:   Future    Standing Expiration Date:   12/11/2023     Follow-up per chemo IS  All questions were answered. The patient knows to call the clinic with any problems, questions or concerns.  ZEarlie Server MD, PhD [facility] H[facility] Oncology 12/11/2022      CHIEF COMPLAINTS/REASON FOR VISIT:  Follow-up for rectal cancer treatments.  HISTORY OF PRESENTING ILLNESS:   April HOFFMASTERis a  51y.o.  female presents for treatment of rectal cancer Oncology History  [place]  02/26/2022 Imaging   MRI PELVIS WITHOUT CONTRAST- By imaging, rectal cancer stage:  T1/T2, N0, Mx    03/02/2022 Imaging   CT CHEST AND ABDOMEN WITH CONTRAST 1. No convincing evidence of metastatic disease within the chest or abdomen. 2. Atrophic left kidney with multifocal renal scarring and cortical calcifications as well as nonobstructive renal stones measuring up to 5 mm. 3. Prominent left-sided predominant retroperitoneal lymph nodes measuring up to 8 mm near the level of the renal hilum, overall decreased in size dating back to CT September 19, 2018 and favored reactive related to left renal inflammation. 4.  Aortic Atherosclerosis (ICD10-I70.0).   03/27/2022 Genetic Testing    [facility] Custom[employer]+RNA cancer panel found no pathogenic mutations.    04/21/2022 Initial Diagnosis   Rectal cancer -  baseline CEA 3.6 -02/09/2022, patient had colonoscopy which showed renal mass 10 cm from anal verge.  5 mm polyp in ascending colon.  Removed and retrieved. Pathology showed rectal adenocarcinoma.  The polyp in the ascending colon is a tubular adenoma. -04/21/2022, patient underwent robotic assisted ultralow anterior resection. Pathology showed moderately differentiated adenocarcinoma, 4.5 cm in maximal extent, with focal extension through muscularis propria into perirectal soft tissue.  3 lymph nodes positive for metastatic carcinoma.  Negative margin.  pT3 pN1b, MSI stable.    05/03/2022 Cancer Staging   Staging form: Colon and Rectum, AJCC 8th Edition - Pathologic stage from 05/03/2022: Stage IIIB (pT3, pN1b, cM0) - Signed by [name], MD on 05/03/2022 Stage prefix: Initial diagnosis   05/11/2022 Miscellaneous   Medi port placed by Dr.White   05/26/2022 -  Chemotherapy   FOLFOX Q2 weeks x 4   05/26/2022 - 07/09/2022 Chemotherapy   Patient is on Treatment Plan : COLORECTAL FOLFOX q14d x 8 cycles     05/26/2022 -  Chemotherapy   Patient is on Treatment Plan : COLORECTAL FOLFOX q14d x 4 months     07/27/2022 -  Chemotherapy   Concurrent chemotherapy- Xeloda  1357m BID and radiation.    07/27/2022 - 09/12/2022 Radiation Therapy    concurrent chemotherapy [Xeloda 1300 mg BID] with RT    09/20/2022 Imaging   CT Angiogram chest PET protocol There is no evidence of pulmonary artery embolism. There is no evidence of thoracic aortic dissection.   Small linear patchy alveolar infiltrate is seen in medial segment of right middle lobe suggesting atelectasis/pneumonia.     09/28/2022 - 09/30/2022 Hospital Admission   Admission due to acute respiratory failure with hypoxia, COPD exacerbation   09/28/2022 Imaging   CT angio chest PE protocol 1. Negative for acute PE or thoracic aortic dissection. 2. New ground-glass infiltrates anteriorly in bilateral upper lobes,may represent atypical edema, infectious or inflammatory process. 3.  Aortic Atherosclerosis     Patient has bipolar and schizophrenia..  Patient is married and lives at home with her husband and son.  INTERVAL HISTORY April ABPLANALPis a 51y.o. female who has above history reviewed by me today presents for follow up visit for rectal cancer.  She feels physically and mentally better today. No new complaints. Denies nausea vomiting. Intermittent finger tip numbness/tingling. Stable.   Review of Systems  Constitutional:  Positive for fatigue. Negative for appetite change, chills and fever.  HENT:   Negative  for hearing loss and voice change.   Eyes:  Negative for eye problems.  Respiratory:  Negative for chest tightness, cough and shortness of breath.   Cardiovascular:  Negative for chest pain.  Gastrointestinal:  Negative for abdominal distention, abdominal pain and blood in stool.       Diarrhea alternating with constipation.  Endocrine: Negative for hot flashes.  Genitourinary:  Negative for difficulty urinating and frequency.   Musculoskeletal:  Negative for arthralgias.  Skin:  Negative for itching and rash.  Neurological:  Positive for numbness. Negative for extremity weakness.  Hematological:  Negative for adenopathy.  Psychiatric/Behavioral:  Negative for confusion.     MEDICAL HISTORY:  Past Medical History:  Diagnosis Date   Allergy    pollen   Anxiety    Arthritis    right hip   Bipolar 1 disorder [place]    Cancer (HCC)    rectal   [place] 10/24/2022   Chronic kidney disease    COPD (chronic obstructive pulmonary  disease) ([place])    Depression    Family history of adverse reaction to anesthesia    grand father had a stroke during anesthesia   Family history of breast cancer    Family history of colon cancer    Family history of uterine cancer    GERD (gastroesophageal reflux disease)    History of kidney stones    Hyperlipidemia    Hypertension    Hypothyroidism    Panic attack    Pneumonia    Psoriasis    Sleep apnea 08/11/2021   No CPAP   Type 2 diabetes mellitus with microalbuminuria, without long-term current use of insulin ([place]) 06/24/2019    SURGICAL HISTORY: Past Surgical History:  Procedure Laterality Date   BREAST BIOPSY Left 12/14/2021   [place] bx, venus marker, path pending   CESAREAN SECTION     COLONOSCOPY WITH PROPOFOL N/A 02/09/2022   Procedure: COLONOSCOPY WITH PROPOFOL;  Surgeon: Lin Landsman, MD;  Location: [place];  Service: Endoscopy;  Laterality: N/A;  sleep apnea   CYSTOSCOPY W/ RETROGRADES  Left 11/08/2018   Procedure: CYSTOSCOPY WITH RETROGRADE PYELOGRAM;  Surgeon: Billey Co, MD;  Location: [facility];  Service: Urology;  Laterality: Left;   CYSTOSCOPY/URETEROSCOPY/HOLMIUM LASER/STENT PLACEMENT Left 11/08/2018   Procedure: CYSTOSCOPY/URETEROSCOPY/HOLMIUM LASER/STENT PLACEMENT;  Surgeon: Billey Co, MD;  Location: [facility];  Service: Urology;  Laterality: Left;   IR IMAGING GUIDED PORT INSERTION  05/11/2022   MOUTH SURGERY     wisdom teeth extraction   MOUTH SURGERY     teeth removal   POLYPECTOMY N/A 02/09/2022   Procedure: POLYPECTOMY;  Surgeon: Lin Landsman, MD;  Location: [facility];  Service: Endoscopy;  Laterality: N/A;   XI ROBOTIC ASSISTED LOWER ANTERIOR RESECTION N/A 04/21/2022   Procedure: XI ROBOTIC ASSISTED LOWER ANTERIOR RESECTION WITH COLOSTOMY, BILATERAL TAP BLOCK, ASSESSMENT OF TISSUE PERFUSSION WITH FIREFLY INJECTION;  Surgeon: Ileana Roup, MD;  Location: [facility];  Service: General;  Laterality: N/A;    SOCIAL HISTORY: Social History   Socioeconomic History   Marital status: Married    Spouse name: Montine Circle   Number of children: 1   Years of education: 12   Highest education level: High school graduate  Occupational History   Occupation: unemployed    Comment: disabled  Tobacco Use   Smoking status: Some Days    Packs/day: 0.25    Years: 34.00    Total pack years: 8.50    Types: Cigarettes    Start date: 05/13/1985   Smokeless tobacco: Never   Tobacco comments:    5-6 cigarettes weekly- 11/07/2022  Vaping Use   Vaping Use: Former   Start date: 05/25/2018   Quit date: 09/24/2018  Substance and Sexual Activity   Alcohol use: Not Currently    Alcohol/week: 4.0 standard drinks of alcohol    Types: 4 Glasses of wine per week    Comment: quit ETOH in Feb. 2023   Drug use: Not Currently    Types: Marijuana    Comment: 2 days ago   Sexual activity: Not Currently    Birth control/protection: Post-menopausal   Other Topics Concern   Not on file  Social History Narrative   Lives with husband and son    Social Determinants of Health   Financial Resource Strain: High Risk (08/25/2022)   Overall Financial Resource Strain (CARDIA)    Difficulty of Paying Living Expenses: Hard  Food Insecurity: No Food Insecurity (09/20/2022)   Hunger  Vital Sign    Worried About Charity fundraiser in the Last Year: Never true    Ran Out of Food in the Last Year: Never true  Recent Concern: [place] Present (08/25/2022)   Hunger Vital Sign    Worried About Running Out of Food in the Last Year: Sometimes true    Ran Out of Food in the Last Year: Sometimes true  Transportation Needs: Unmet Transportation Needs (12/11/2022)   PRAPARE - Hydrologist (Medical): Yes    Lack of Transportation (Non-Medical): Yes  Physical Activity: Insufficiently Active (08/25/2022)   Exercise Vital Sign    Days of Exercise per Week: 1 day    Minutes of Exercise per Session: 10 min  Stress: Stress Concern Present (08/25/2022)   [place]    Feeling of Stress : To some extent  Social Connections: Moderately Integrated (08/25/2022)   Social Connection and Isolation Panel [NHANES]    Frequency of Communication with Friends and Family: More than three times a week    Frequency of Social Gatherings with Friends and Family: Twice a week    Attends Religious Services: Never    Marine scientist or Organizations: No    Attends Music therapist: 1 to 4 times per year    Marital Status: Married  Human resources officer Violence: Not At Risk (09/20/2022)   Humiliation, Afraid, Rape, and Kick questionnaire    Fear of Current or Ex-Partner: No    Emotionally Abused: No    Physically Abused: No    Sexually Abused: No    FAMILY HISTORY: Family History  Problem Relation Age of Onset   Depression Mother    Anxiety  disorder Mother    Diabetes Mother    Hypertension Mother    Hyperlipidemia Mother    Cancer Mother    Uterine cancer Mother 95   Cervical cancer Mother 54   Colon cancer Father    Depression Brother    Anxiety disorder Brother    Cancer Maternal Aunt        unk types   Diabetes Mellitus II Maternal Grandmother    Hypercholesterolemia Maternal Grandmother    Breast cancer Maternal Grandmother    Cancer Paternal Grandmother    Diabetes Paternal Grandmother    Melanoma Paternal Grandmother    Stomach cancer Paternal Grandmother     ALLERGIES:  is allergic to metformin and related, nsaids, perphenazine, sulfa antibiotics, abilify [aripiprazole], and penicillins.  MEDICATIONS:  Current Outpatient Medications  Medication Sig Dispense Refill   ACCU-CHEK GUIDE test strip USE UP TO FOUR TIMES DAILY AS DIRECTED. 100 each 5   acetaminophen (TYLENOL) 325 MG tablet Take 650 mg by mouth every 6 (six) hours as needed for headache (pain).     albuterol (VENTOLIN HFA) 108 (90 Base) MCG/ACT inhaler Inhale 2 puffs into the lungs every 6 (six) hours as needed for wheezing or shortness of breath. 1 each 5   amantadine (SYMMETREL) 100 MG capsule Take 100 mg by mouth 2 (two) times daily.     blood glucose meter kit and supplies Dispense based on patient and insurance preference. Use up to four times daily as directed. (FOR ICD-10 E10.9, E11.9). 1 each 0   Budeson-Glycopyrrol-Formoterol (BREZTRI AEROSPHERE) 160-9-4.8 MCG/ACT AERO Inhale 2 puffs into the lungs in the morning and at bedtime. 5.9 g 11   buPROPion (WELLBUTRIN XL) 300 MG 24 hr tablet Take  300 mg by mouth daily.     Cholecalciferol (VITAMIN D-3) 125 MCG (5000 UT) TABS Take 5,000 Units by mouth daily. 30 tablet 1   diltiazem (CARDIZEM CD) 180 MG 24 hr capsule TAKE 1 CAPSULE BY MOUTH DAILY 30 capsule 0   docusate sodium (COLACE) 100 MG capsule TAKE 1 CAPSULE BY MOUTH TWICE DAILY 60 capsule 1   FARXIGA 10 MG TABS tablet TAKE 1 TABLET BY MOUTH  DAILY BEFORE BREAKFAST 30 tablet 0   gabapentin (NEURONTIN) 300 MG capsule Take 300 mg by mouth 2 (two) times daily.     hydrocortisone cream 0.5 % Apply 1 Application topically 2 (two) times daily as needed for itching. 30 g 1   Hydrocortisone, Perianal, 1 % CREA Apply topically.     hydrOXYzine (VISTARIL) 50 MG capsule Take 50 mg by mouth 3 (three) times daily as needed for anxiety.     icosapent Ethyl (VASCEPA) 1 g capsule TAKE 2 CAPSULES BY MOUTH 2 TIMES DAILY 120 capsule 3   insulin detemir (LEVEMIR FLEXPEN) 100 UNIT/ML FlexPen Inject 4-10 Units into the skin daily. 15 mL 0   Insulin Pen Needle 32G X 4 MM MISC 1 each by Does not apply route daily at 12 noon. 100 each 0   ipratropium-albuterol (DUONEB) 0.5-2.5 (3) MG/3ML SOLN Take 3 mLs by nebulization every 6 (six) hours as needed. 360 mL 11   levothyroxine (SYNTHROID) 75 MCG tablet TAKE 1 TABLET BY MOUTH DAILY BEFORE BREAKFAST 90 tablet 2   loratadine (CLARITIN) 10 MG tablet Take 10 mg by mouth every morning.     losartan (COZAAR) 25 MG tablet Take 1 tablet (25 mg total) by mouth at bedtime. 30 tablet 3   nicotine (NICODERM CQ - DOSED IN MG/24 HOURS) 21 mg/24hr patch Aplly 61m patch chest wall daily (okay to substitute generic) 28 patch 0   nicotine polacrilex (NICOTINE MINI) 2 MG lozenge Take 1 lozenge (2 mg total) by mouth every 2 (two) hours as needed for smoking cessation. 72 lozenge 3   nitrofurantoin, macrocrystal-monohydrate, (MACROBID) 100 MG capsule Take 1 capsule (100 mg total) by mouth 2 (two) times daily. 10 capsule 0   NON FORMULARY Pt uses a cpap nightly     paliperidone (INVEGA SUSTENNA) 234 MG/1.5ML SUSY injection Inject 234 mg into the muscle every 30 (thirty) days. On or about the 14th of each month     pantoprazole (PROTONIX) 40 MG tablet TAKE 1 TABLET BY MOUTH DAILY 90 tablet 2   Pediatric Multivit-Minerals-C (CHEWABLES MULTIVITAMIN PO) Take 1 tablet by mouth daily.     pioglitazone (ACTOS) 15 MG tablet TAKE 1 TABLET BY  MOUTH DAILY 30 tablet 0   rosuvastatin (CRESTOR) 40 MG tablet Take 1 tablet (40 mg total) by mouth every morning. 30 tablet 3   sertraline (ZOLOFT) 100 MG tablet Take 200 mg by mouth every morning.     Spacer/Aero-Holding Chambers (AEROCHAMBER MV) inhaler Use as instructed 1 each 0   albuterol (PROVENTIL) (2.5 MG/3ML) 0.083% nebulizer solution Take 3 mLs (2.5 mg total) by nebulization every 6 (six) hours as needed for shortness of breath or wheezing. 75 mL 0   No current facility-administered medications for this visit.   Facility-Administered Medications Ordered in Other Visits  Medication Dose Route Frequency Provider Last Rate Last Admin   0.9 %  sodium chloride infusion   Intravenous Continuous YEarlie Server MD   Stopped at 12/11/22 1017   fluorouracil (ADRUCIL) 3,800 mg in sodium chloride 0.9 % 74 mL  chemo infusion  2,400 mg/m2 (Treatment Plan Recorded) Intravenous 1 day or 1 dose [name], MD   Infusion Verify at 12/11/22 1503   heparin lock flush 100 unit/mL  500 Units Intracatheter Once PRN [name], MD       sodium chloride flush (NS) 0.9 % injection 10 mL  10 mL Intracatheter PRN [name], MD   10 mL at 11/29/22 1456     PHYSICAL EXAMINATION: ECOG PERFORMANCE STATUS: 0 - Asymptomatic Vitals:   12/11/22 0927  BP: 109/80  Pulse: (!) 111  Temp: (!) 96.4 F (35.8 C)  SpO2: 98%    Filed Weights   12/11/22 0927  Weight: 132 lb 6.4 oz (60.1 kg)     Physical Exam Constitutional:      General: She is not in acute distress. HENT:     Head: Normocephalic and atraumatic.  Eyes:     General: No scleral icterus. Cardiovascular:     Rate and Rhythm: Normal rate and regular rhythm.     Heart sounds: Normal heart sounds.  Pulmonary:     Effort: Pulmonary effort is normal. No respiratory distress.     Breath sounds: Wheezing present.     Comments: Decreased breath sound bilaterally Abdominal:     General: Bowel sounds are normal. There is no distension.     Palpations: Abdomen  is soft.     Comments: Liquid stool in ostomy bag.  Musculoskeletal:        General: No deformity. Normal range of motion.     Cervical back: Normal range of motion and neck supple.  Skin:    General: Skin is warm and dry.     Findings: No erythema or rash.  Neurological:     Mental Status: She is alert and oriented to person, place, and time. Mental status is at baseline.     Cranial Nerves: No cranial nerve deficit.     Coordination: Coordination normal.  Psychiatric:        Mood and Affect: Mood normal.     LABORATORY DATA:  I have reviewed the data as listed    Latest Ref Rng & Units 12/11/2022    9:08 AM 11/27/2022    8:56 AM 11/07/2022    8:56 AM  CBC  WBC 4.0 - 10.5 K/uL 3.7  10.3  5.9   Hemoglobin 12.0 - 15.0 g/dL 13.7  13.9  15.0   Hematocrit 36.0 - 46.0 % 40.2  41.0  43.5   Platelets 150 - 400 K/uL 105  125  119       Latest Ref Rng & Units 12/11/2022    9:08 AM 11/27/2022    8:56 AM 11/07/2022    8:56 AM  CMP  Glucose 70 - 99 mg/dL 207  147  196   BUN 6 - 20 mg/dL _0 Creatinine 0.44 - 1.00 mg/dL 1.14  0.80  0.97   Sodium 135 - 145 mmol/L 137  136  133   Potassium 3.5 - 5.1 mmol/L 3.6  4.0  4.2   Chloride 98 - 111 mmol/L 103  102  99   CO2 22 - 32 mmol/L _1 Calcium 8.9 - 10.3 mg/dL 8.6  8.7  9.1   Total Protein 6.5 - 8.1 g/dL 7.2  7.0  7.5   Total Bilirubin 0.3 - 1.2 mg/dL 0.2  0.4  0.4   Alkaline Phos 38 - 126 U/L 72  76  85   AST 15 - 41 U/L _0 ALT 0 - 44 U/L 14  11  32         RADIOGRAPHIC STUDIES: I have personally reviewed the radiological images as listed and agreed with the findings in the report. No results found.

## 2022-12-11 NOTE — Assessment & Plan Note (Signed)
Chemotherapy plan as listed above 

## 2022-12-11 NOTE — Patient Instructions (Signed)
[facility] AT [place]  Discharge Instructions: Thank you for choosing [facility] to provide your oncology and hematology care.  If you have a lab appointment with the [facility], please go directly to the [facility] and check in at the registration area.  Wear comfortable clothing and clothing appropriate for easy access to any Portacath or PICC line.   We strive to give you quality time with your provider. You may need to reschedule your appointment if you arrive late (15 or more minutes).  Arriving late affects you and other patients whose appointments are after yours.  Also, if you miss three or more appointments without notifying the office, you may be dismissed from the clinic at the provider's discretion.      For prescription refill requests, have your pharmacy contact our office and allow 72 hours for refills to be completed.    Today you received the following chemotherapy and/or immunotherapy agents FOLFOX      To help prevent nausea and vomiting after your treatment, we encourage you to take your nausea medication as directed.  BELOW ARE SYMPTOMS THAT SHOULD BE REPORTED IMMEDIATELY: *FEVER GREATER THAN 100.4 F (38 C) OR HIGHER *CHILLS OR SWEATING *NAUSEA AND VOMITING THAT IS NOT CONTROLLED WITH YOUR NAUSEA MEDICATION *UNUSUAL SHORTNESS OF BREATH *UNUSUAL BRUISING OR BLEEDING *URINARY PROBLEMS (pain or burning when urinating, or frequent urination) *BOWEL PROBLEMS (unusual diarrhea, constipation, pain near the anus) TENDERNESS IN MOUTH AND THROAT WITH OR WITHOUT PRESENCE OF ULCERS (sore throat, sores in mouth, or a toothache) UNUSUAL RASH, SWELLING OR PAIN  UNUSUAL VAGINAL DISCHARGE OR ITCHING   Items with * indicate a potential emergency and should be followed up as soon as possible or go to the Emergency Department if any problems should occur.  Please show the CHEMOTHERAPY ALERT CARD or IMMUNOTHERAPY ALERT CARD at check-in to the  Emergency Department and triage nurse.  Should you have questions after your visit or need to cancel or reschedule your appointment, please contact [facility] [place] AT [place]  [phone] and follow the prompts.  Office hours are 8:00 a.m. to 4:30 p.m. Monday - Friday. Please note that voicemails left after 4:00 p.m. may not be returned until the following business day.  We are closed weekends and major holidays. You have access to a nurse at all times for urgent questions. Please call the main number to the clinic [phone] and follow the prompts.  For any non-urgent questions, you may also contact your provider using MyChart. We now offer e-Visits for anyone 21 and older to request care online for non-urgent symptoms. For details visit [email or web address].   Also download the MyChart app! Go to the app store, search "MyChart", open the app, select [facility], and log in with your MyChart username and password.  Masks are optional in the cancer centers. If you would like for your care team to wear a mask while they are taking care of you, please let them know. For doctor visits, patients may have with them one support person who is at least 50 years old. At this time, visitors are not allowed in the infusion area.

## 2022-12-11 NOTE — Assessment & Plan Note (Signed)
Recently quitted.  Encouraged her cessation effort.

## 2022-12-11 NOTE — Assessment & Plan Note (Signed)
IVF 1L of NS today.  Repeat IVF on D3  Recheck BMP in 1 week.

## 2022-12-11 NOTE — Assessment & Plan Note (Signed)
Grade 1, stabe

## 2022-12-11 NOTE — Assessment & Plan Note (Signed)
Trending low, close monitor.

## 2022-12-13 ENCOUNTER — Inpatient Hospital Stay: Payer: Medicare Other

## 2022-12-13 VITALS — BP 101/74 | HR 94 | Temp 97.1°F | Resp 18

## 2022-12-13 DIAGNOSIS — C20 Malignant neoplasm of rectum: Secondary | ICD-10-CM | POA: Diagnosis not present

## 2022-12-13 MED ORDER — SODIUM CHLORIDE 0.9 % IV SOLN
INTRAVENOUS | Status: DC
  Filled 2022-12-13 (×2): qty 250

## 2022-12-13 MED ORDER — HEPARIN SOD (PORK) LOCK FLUSH 100 UNIT/ML IV SOLN
500.0000 [IU] | Freq: Once | INTRAVENOUS | Status: AC | PRN
  Administered 2022-12-13: 500 [IU]
  Filled 2022-12-13: qty 5

## 2022-12-19 ENCOUNTER — Inpatient Hospital Stay: Payer: Medicare Other

## 2022-12-19 ENCOUNTER — Other Ambulatory Visit: Payer: Self-pay | Admitting: Family Medicine

## 2022-12-21 ENCOUNTER — Inpatient Hospital Stay: Payer: Medicare Other

## 2022-12-21 ENCOUNTER — Ambulatory Visit
Admission: RE | Admit: 2022-12-21 | Discharge: 2022-12-21 | Disposition: A | Discharge status: Home / Self Care | Payer: Medicare Other | Source: Ambulatory Visit | Attending: Oncology | Admitting: Oncology

## 2022-12-21 DIAGNOSIS — C20 Malignant neoplasm of rectum: Secondary | ICD-10-CM | POA: Diagnosis present

## 2022-12-21 MED ORDER — IOHEXOL 300 MG/ML  SOLN
85.0000 mL | Freq: Once | INTRAMUSCULAR | Status: AC | PRN
  Administered 2022-12-21: 85 mL via INTRAVENOUS

## 2022-12-26 ENCOUNTER — Encounter: Payer: Self-pay | Admitting: Family Medicine

## 2022-12-26 ENCOUNTER — Ambulatory Visit (INDEPENDENT_AMBULATORY_CARE_PROVIDER_SITE_OTHER): Payer: Medicare Other | Admitting: Family Medicine

## 2022-12-26 ENCOUNTER — Telehealth: Payer: Self-pay

## 2022-12-26 VITALS — BP 120/78 | HR 93 | Resp 16 | Ht 59.0 in | Wt 130.0 lb

## 2022-12-26 DIAGNOSIS — E032 Hypothyroidism due to medicaments and other exogenous substances: Secondary | ICD-10-CM | POA: Diagnosis not present

## 2022-12-26 DIAGNOSIS — E785 Hyperlipidemia, unspecified: Secondary | ICD-10-CM

## 2022-12-26 DIAGNOSIS — J411 Mucopurulent chronic bronchitis: Secondary | ICD-10-CM

## 2022-12-26 DIAGNOSIS — F3132 Bipolar disorder, current episode depressed, moderate: Secondary | ICD-10-CM

## 2022-12-26 DIAGNOSIS — I700 Atherosclerosis of aorta: Secondary | ICD-10-CM

## 2022-12-26 DIAGNOSIS — I10 Essential (primary) hypertension: Secondary | ICD-10-CM | POA: Diagnosis not present

## 2022-12-26 DIAGNOSIS — B850 Pediculosis due to Pediculus humanus capitis: Secondary | ICD-10-CM | POA: Diagnosis not present

## 2022-12-26 DIAGNOSIS — N200 Calculus of kidney: Secondary | ICD-10-CM

## 2022-12-26 DIAGNOSIS — E1169 Type 2 diabetes mellitus with other specified complication: Secondary | ICD-10-CM

## 2022-12-26 DIAGNOSIS — R809 Proteinuria, unspecified: Secondary | ICD-10-CM

## 2022-12-26 DIAGNOSIS — E1129 Type 2 diabetes mellitus with other diabetic kidney complication: Secondary | ICD-10-CM | POA: Diagnosis not present

## 2022-12-26 LAB — POCT GLYCOSYLATED HEMOGLOBIN (HGB A1C): Hemoglobin A1C: 6.5 % — AB (ref 4.0–5.6)

## 2022-12-26 MED ORDER — ICOSAPENT ETHYL 1 G PO CAPS: ORAL_CAPSULE | ORAL | 3 refills | Status: DC

## 2022-12-26 MED ORDER — DILTIAZEM HCL ER COATED BEADS 180 MG PO CP24: 180.0000 mg | ORAL_CAPSULE | Freq: Every day | ORAL | 3 refills | Status: DC

## 2022-12-26 MED ORDER — PIOGLITAZONE HCL 15 MG PO TABS: 15.0000 mg | ORAL_TABLET | Freq: Every day | ORAL | 3 refills | Status: DC

## 2022-12-26 MED ORDER — LOSARTAN POTASSIUM 25 MG PO TABS: 25.0000 mg | ORAL_TABLET | Freq: Every day | ORAL | 3 refills | Status: DC

## 2022-12-26 MED ORDER — DAPAGLIFLOZIN PROPANEDIOL 10 MG PO TABS: 10.0000 mg | ORAL_TABLET | Freq: Every day | ORAL | 3 refills | Status: DC

## 2022-12-26 MED ORDER — LEVEMIR FLEXPEN 100 UNIT/ML ~~LOC~~ SOPN: 4.0000 [IU] | PEN_INJECTOR | Freq: Every day | SUBCUTANEOUS | 3 refills | Status: DC

## 2022-12-26 MED ORDER — MALATHION 0.5 % EX LOTN: TOPICAL_LOTION | Freq: Once | CUTANEOUS | 1 refills | Status: AC

## 2022-12-26 MED ORDER — ROSUVASTATIN CALCIUM 40 MG PO TABS: 40.0000 mg | ORAL_TABLET | Freq: Every morning | ORAL | 3 refills | Status: DC

## 2022-12-26 NOTE — Telephone Encounter (Signed)
-----   Message from [name], MD sent at 12/24/2022  1:32 PM EST ----- CT shows cancer recurrence.  Left kidney stone. Please refer to urology for evaluation.

## 2022-12-26 NOTE — Progress Notes (Signed)
Name: April Luna   MRN: [id number]    DOB: 07/26/1971   Date:12/26/2022       Progress Note  Subjective  Chief Complaint  Follow Up  HPI  DM: She is taking Levemir about 4 units , Actos and Farxiga, fasting around 90-100 . She denies side effects. She is having polyphagia, polydipsia and polyuria. Last A1C was 7.4 % but today is down to 6.5 %  She has associated dyslipidemia and HTN., CKI stage I and microalbuminuria    Mucopurulent Chronic Bronchitis :  She has chronic cough and sputum that is lighter now, almost white,  she also has wheezing usually at night and SOB with activity. She also had a sleep study that was positive for OSA but now she said unable to tolerate CPAP while at [facility]. She quit smoking but resumed smoking again since Nov 3875   Lice: recurrent: she would like a refill of Malathion    HTN: she is on losartan for her kidney protection also on cardizem since hospital discharge due to tachycardia. She denies chest pain, palpitation or dizziness. BP is at goal today, we will continue current regiment    Hyperlipidemia/Atherosclerosis of aorta : she is now getting medication delivered pre-packaged to her house and has been more compliant, on crestor and vascepa  We will recheck labs    GERD: currently no heartburn or indigestion , she is taking PPI, now seeing Dr. Marius Ditch , she stopped linzess due to colostomy pouch after rectal cancer , she finished chemotherapy but not sure if able to do a re-anastomosis, she states she is okay having the pouch .    Bipolar affective  disorder: she is seeing psychiatrist Dr. Loni Muse at [facility], she came in alone  Last admission was 2016 for a psychotic episode. No history of suicide attempts. She has been feeling more anxious lately She is also having seasonal affective disorder , she  has follow up with psychiatrist today    Hypothyroidism: she is taking levothyroxine and denies hair loss, no change in bowel movement, she has chronic dry  skin. She states started to take levothyroxine due to lithium therapy years ago We will recheck labs next visit    Chronic right lower back pain: going on for years, she used to see chiropractor but cannot afford it,  seems to be right on top of sacro- iliac joint, she states occasionally shoots down to her right lower leg. Taking gabapentin and prn tylenol and symptoms are stable. She has noticed some right upper back pain and sometimes has neck pain , trying to stretch her neck    Psoriasis: seen by dermatologist in the past, Dr. Koleen Nimrod, she is now getting otc shampoo and is doing well controlling scalp itching .Unchanged    Rectal cancer: found during screening colonoscopy, diagnosed 01/2022 , she had robotic resection April 2023 , had radiation therapy and last chemotherapy session Dec 2023. She states appetite is still poor   Atherosclerosis of Aorta: taking statins and fish oil, continue medication.Unchanged    Malnutrition: she has lost over 10 % of her weight since Oct 2022, weight was 151 lbs it went down to 130 lbs and today is 130  lbs, weight has stabilized over the past few months . Lack of appetite, she is taking She was taking Glucerna but ran out of supplements    Patient Active Problem List   Diagnosis Date Noted   UTI (urinary tract infection) 11/07/2022   Chemotherapy-induced neuropathy (  [place]) 10/24/2022   Tachycardia 09/22/2022   Acute respiratory failure with hypoxia ([employer]) 09/21/2022   Lobar pneumonia ([place]) 09/21/2022   COPD with acute exacerbation ([place]) 09/20/2022   Atherosclerosis of aorta ([place]) 07/17/2022   Stage 3a chronic kidney disease ([place]) 07/17/2022   Thrombocytopenia ([place]) 06/24/2022   Elevated serum creatinine 06/23/2022   Tobacco use 05/26/2022   Encounter for antineoplastic chemotherapy 05/17/2022   Goals of care, counseling/discussion 05/03/2022   Rectal cancer ([place]) 04/21/2022   Genetic testing 03/27/2022   Family history of colon cancer 03/14/2022    Family history of uterine cancer 03/14/2022   Family history of breast cancer 03/14/2022   Polyp of ascending colon    Obstructive sleep apnea syndrome 08/11/2021   Plantar callus 10/06/2019   Acquired hallux limitus of both feet 10/06/2019   Uncontrolled type 2 diabetes mellitus with hyperglycemia, without long-term current use of insulin ([place]) 06/24/2019   Chronic obstructive pulmonary disease ([place]) 06/24/2019   Chronic constipation 06/24/2019   ASCUS of cervix with negative high risk HPV 09/05/2018   GERD without esophagitis 04/11/2018   Hyperlipidemia 04/11/2018   Essential hypertension 04/11/2018   Hypothyroidism 04/26/2015   Bipolar I disorder, most recent episode (or current) manic ([place]) 04/25/2015   Delirium due to another medical condition 04/25/2015   Cannabis abuse 04/25/2015    Past Surgical History:  Procedure Laterality Date   BREAST BIOPSY Left 12/14/2021   [place] bx, venus marker, path pending   CESAREAN SECTION     COLONOSCOPY WITH PROPOFOL N/A 02/09/2022   Procedure: COLONOSCOPY WITH PROPOFOL;  Surgeon: Lin Landsman, MD;  Location: [place];  Service: Endoscopy;  Laterality: N/A;  sleep apnea   CYSTOSCOPY W/ RETROGRADES Left 11/08/2018   Procedure: CYSTOSCOPY WITH RETROGRADE PYELOGRAM;  Surgeon: Billey Co, MD;  Location: [facility];  Service: Urology;  Laterality: Left;   CYSTOSCOPY/URETEROSCOPY/HOLMIUM LASER/STENT PLACEMENT Left 11/08/2018   Procedure: CYSTOSCOPY/URETEROSCOPY/HOLMIUM LASER/STENT PLACEMENT;  Surgeon: Billey Co, MD;  Location: [facility];  Service: Urology;  Laterality: Left;   IR IMAGING GUIDED PORT INSERTION  05/11/2022   MOUTH SURGERY     wisdom teeth extraction   MOUTH SURGERY     teeth removal   POLYPECTOMY N/A 02/09/2022   Procedure: POLYPECTOMY;  Surgeon: Lin Landsman, MD;  Location: [place];  Service: Endoscopy;  Laterality: N/A;   XI ROBOTIC ASSISTED LOWER ANTERIOR RESECTION N/A 04/21/2022    Procedure: XI ROBOTIC ASSISTED LOWER ANTERIOR RESECTION WITH COLOSTOMY, BILATERAL TAP BLOCK, ASSESSMENT OF TISSUE PERFUSSION WITH FIREFLY INJECTION;  Surgeon: Ileana Roup, MD;  Location: [facility];  Service: General;  Laterality: N/A;    Family History  Problem Relation Age of Onset   Depression Mother    Anxiety disorder Mother    Diabetes Mother    Hypertension Mother    Hyperlipidemia Mother    Cancer Mother    Uterine cancer Mother 22   Cervical cancer Mother 39   Colon cancer Father    Depression Brother    Anxiety disorder Brother    Cancer Maternal Aunt        unk types   Diabetes Mellitus II Maternal Grandmother    Hypercholesterolemia Maternal Grandmother    Breast cancer Maternal Grandmother    Cancer Paternal Grandmother    Diabetes Paternal Grandmother    Melanoma Paternal Grandmother    Stomach cancer Paternal Grandmother     Social History   Tobacco Use   Smoking status: Some Days  Packs/day: 0.25    Years: 34.00    Total pack years: 8.50    Types: Cigarettes    Start date: 05/13/1985   Smokeless tobacco: Never   Tobacco comments:    5-6 cigarettes weekly- 11/07/2022  Substance Use Topics   Alcohol use: Not Currently    Alcohol/week: 4.0 standard drinks of alcohol    Types: 4 Glasses of wine per week    Comment: quit ETOH in Feb. 2023     Current Outpatient Medications:    ACCU-CHEK GUIDE test strip, USE UP TO FOUR TIMES DAILY AS DIRECTED., Disp: 100 each, Rfl: 5   acetaminophen (TYLENOL) 325 MG tablet, Take 650 mg by mouth every 6 (six) hours as needed for headache (pain)., Disp: , Rfl:    albuterol (VENTOLIN HFA) 108 (90 Base) MCG/ACT inhaler, Inhale 2 puffs into the lungs every 6 (six) hours as needed for wheezing or shortness of breath., Disp: 1 each, Rfl: 5   amantadine (SYMMETREL) 100 MG capsule, Take 100 mg by mouth 2 (two) times daily., Disp: , Rfl:    blood glucose meter kit and supplies, Dispense based on patient and insurance  preference. Use up to four times daily as directed. (FOR ICD-10 E10.9, E11.9)., Disp: 1 each, Rfl: 0   Budeson-Glycopyrrol-Formoterol (BREZTRI AEROSPHERE) 160-9-4.8 MCG/ACT AERO, Inhale 2 puffs into the lungs in the morning and at bedtime., Disp: 5.9 g, Rfl: 11   buPROPion (WELLBUTRIN XL) 300 MG 24 hr tablet, Take 300 mg by mouth daily., Disp: , Rfl:    Cholecalciferol (VITAMIN D-3) 125 MCG (5000 UT) TABS, Take 5,000 Units by mouth daily., Disp: 30 tablet, Rfl: 1   diltiazem (CARDIZEM CD) 180 MG 24 hr capsule, TAKE 1 CAPSULE BY MOUTH DAILY, Disp: 30 capsule, Rfl: 0   docusate sodium (COLACE) 100 MG capsule, TAKE 1 CAPSULE BY MOUTH TWICE DAILY, Disp: 60 capsule, Rfl: 1   FARXIGA 10 MG TABS tablet, TAKE 1 TABLET BY MOUTH DAILY BEFORE BREAKFAST, Disp: 30 tablet, Rfl: 0   gabapentin (NEURONTIN) 300 MG capsule, Take 300 mg by mouth 2 (two) times daily., Disp: , Rfl:    hydrocortisone cream 0.5 %, Apply 1 Application topically 2 (two) times daily as needed for itching., Disp: 30 g, Rfl: 1   Hydrocortisone, Perianal, 1 % CREA, Apply topically., Disp: , Rfl:    hydrOXYzine (VISTARIL) 50 MG capsule, Take 50 mg by mouth 3 (three) times daily as needed for anxiety., Disp: , Rfl:    icosapent Ethyl (VASCEPA) 1 g capsule, TAKE 2 CAPSULES BY MOUTH 2 TIMES DAILY, Disp: 120 capsule, Rfl: 3   insulin detemir (LEVEMIR FLEXPEN) 100 UNIT/ML FlexPen, Inject 4-10 Units into the skin daily., Disp: 15 mL, Rfl: 0   Insulin Pen Needle 32G X 4 MM MISC, 1 each by Does not apply route daily at 12 noon., Disp: 100 each, Rfl: 0   ipratropium-albuterol (DUONEB) 0.5-2.5 (3) MG/3ML SOLN, Take 3 mLs by nebulization every 6 (six) hours as needed., Disp: 360 mL, Rfl: 11   levothyroxine (SYNTHROID) 75 MCG tablet, TAKE 1 TABLET BY MOUTH DAILY BEFORE BREAKFAST, Disp: 90 tablet, Rfl: 2   loratadine (CLARITIN) 10 MG tablet, Take 10 mg by mouth every morning., Disp: , Rfl:    losartan (COZAAR) 25 MG tablet, Take 1 tablet (25 mg total) by  mouth at bedtime., Disp: 30 tablet, Rfl: 3   nicotine (NICODERM CQ - DOSED IN MG/24 HOURS) 21 mg/24hr patch, Aplly 24m patch chest wall daily (okay to substitute generic),  Disp: 28 patch, Rfl: 0   nicotine polacrilex (NICOTINE MINI) 2 MG lozenge, Take 1 lozenge (2 mg total) by mouth every 2 (two) hours as needed for smoking cessation., Disp: 72 lozenge, Rfl: 3   nitrofurantoin, macrocrystal-monohydrate, (MACROBID) 100 MG capsule, Take 1 capsule (100 mg total) by mouth 2 (two) times daily., Disp: 10 capsule, Rfl: 0   NON FORMULARY, Pt uses a cpap nightly, Disp: , Rfl:    paliperidone (INVEGA SUSTENNA) 234 MG/1.5ML SUSY injection, Inject 234 mg into the muscle every 30 (thirty) days. On or about the 14th of each month, Disp: , Rfl:    pantoprazole (PROTONIX) 40 MG tablet, TAKE 1 TABLET BY MOUTH DAILY, Disp: 90 tablet, Rfl: 2   Pediatric Multivit-Minerals-C (CHEWABLES MULTIVITAMIN PO), Take 1 tablet by mouth daily., Disp: , Rfl:    pioglitazone (ACTOS) 15 MG tablet, TAKE 1 TABLET BY MOUTH DAILY, Disp: 30 tablet, Rfl: 0   rosuvastatin (CRESTOR) 40 MG tablet, Take 1 tablet (40 mg total) by mouth every morning., Disp: 30 tablet, Rfl: 3   sertraline (ZOLOFT) 100 MG tablet, Take 200 mg by mouth every morning., Disp: , Rfl:    Spacer/Aero-Holding Chambers (AEROCHAMBER MV) inhaler, Use as instructed, Disp: 1 each, Rfl: 0   albuterol (PROVENTIL) (2.5 MG/3ML) 0.083% nebulizer solution, Take 3 mLs (2.5 mg total) by nebulization every 6 (six) hours as needed for shortness of breath or wheezing., Disp: 75 mL, Rfl: 0 No current facility-administered medications for this visit.  Facility-Administered Medications Ordered in Other Visits:    sodium chloride flush (NS) 0.9 % injection 10 mL, 10 mL, Intracatheter, PRN, [name], MD, 10 mL at 11/29/22 1456  Allergies  Allergen Reactions   Metformin And Related Nausea And Vomiting   Nsaids Other (See Comments)    Stage 3 CKD   Perphenazine Other (See Comments)     Tremors, muscle weakness, tongue swelling   Sulfa Antibiotics Other (See Comments)    GI distress    Abilify [Aripiprazole] Rash   Penicillins Rash    She has taken amoxicillin without problems    I personally reviewed active problem list, medication list, allergies, family history, social history, health maintenance with the patient/caregiver today.   ROS  Constitutional: Negative for fever or weight change.  Respiratory: positive for cough and shortness of breath.   Cardiovascular: Negative for chest pain or palpitations.  Gastrointestinal: Negative for abdominal pain, no bowel changes.  Musculoskeletal: Negative for gait problem or joint swelling.  Skin: Negative for rash.  Neurological: Negative for dizziness or headache.  No other specific complaints in a complete review of systems (except as listed in HPI above).   Objective  Vitals:   12/26/22 1406  BP: 120/78  Pulse: 93  Resp: 16  SpO2: 98%  Weight: 130 lb (59 kg)  Height: _0  (1.499 m)    Body mass index is 26.26 kg/m.  Physical Exam  Constitutional: Patient appears well-developed and well-nourished. Obese  No distress.  HEENT: head atraumatic, normocephalic, pupils equal and reactive to light, neck supple, throat within normal limits Cardiovascular: Normal rate, regular rhythm and normal heart sounds.  No murmur heard. No BLE edema. Pulmonary/Chest: Effort normal and breath sounds normal. No respiratory distress. Abdominal: Soft.  There is no tenderness. Scalp: excoriation nuchal area, also knits on hair  Psychiatric: Patient has a normal mood and affect. behavior is normal. Judgment and thought content normal.   PHQ2/9:    12/26/2022    2:07 PM 11/10/2022  2:52 PM 11/03/2022   10:00 AM 10/05/2022   10:12 AM 09/20/2022    8:59 AM  Depression screen PHQ 2/9  Decreased Interest 1 1 0 1 1  Down, Depressed, Hopeless _0 PHQ - 2 Score _1 Altered sleeping 0 1 0 1 3  Tired, decreased  energy 3 1 0 1 3  Change in appetite 3 1 0 1 3  Feeling bad or failure about yourself  0 0 0 0 0  Trouble concentrating 0 1 0 1 1  Moving slowly or fidgety/restless 0 0 0 0 0  Suicidal thoughts 0 0 0 0 0  PHQ-9 Score _2 Difficult doing work/chores  Somewhat difficult  Somewhat difficult Somewhat difficult    phq 9 is positive   Fall Risk:    12/26/2022    2:06 PM 11/10/2022    2:52 PM 11/03/2022    9:52 AM 10/05/2022   10:11 AM 09/20/2022    8:44 AM  Fall Risk   Falls in the past year? 0 0 0 0 0  Number falls in past yr: 0  0    Injury with Fall? 0  0    Risk for fall due to : _3   Follow up Falls prevention discussed Falls prevention discussed;Education provided;Falls evaluation completed Falls prevention discussed Falls prevention discussed;Education provided;Falls evaluation completed Falls prevention discussed      Functional Status Survey: Is the patient deaf or have difficulty hearing?: No Does the patient have difficulty seeing, even when wearing glasses/contacts?: No Does the patient have difficulty concentrating, remembering, or making decisions?: No Does the patient have difficulty walking or climbing stairs?: No Does the patient have difficulty dressing or bathing?: No Does the patient have difficulty doing errands alone such as visiting a doctor's office or shopping?: No    Assessment & Plan  1. Type 2 diabetes mellitus with microalbuminuria, without long-term current use of insulin (HCC)  - POCT HgB A1C - dapagliflozin propanediol (FARXIGA) 10 MG TABS tablet; Take 1 tablet (10 mg total) by mouth daily before breakfast.  Dispense: 30 tablet; Refill: 3 - losartan (COZAAR) 25 MG tablet; Take 1 tablet (25 mg total) by mouth at bedtime.  Dispense: 30 tablet; Refill: 3  2. Lice infested hair  - malathion (OVIDE) 0.5 % lotion; Apply topically once for 1 dose. Sprinkle lotion on dry hair and  rub gently until the scalp is thoroughly moistened. Allow to dry naturally and leave uncovered.  Dispense: 59 mL; Refill: 1  3. Dyslipidemia associated with type 2 diabetes mellitus (HCC)  - icosapent Ethyl (VASCEPA) 1 g capsule; TAKE 2 CAPSULES BY MOUTH 2 TIMES DAILY  Dispense: 120 capsule; Refill: 3 - pioglitazone (ACTOS) 15 MG tablet; Take 1 tablet (15 mg total) by mouth daily.  Dispense: 30 tablet; Refill: 3 - rosuvastatin (CRESTOR) 40 MG tablet; Take 1 tablet (40 mg total) by mouth every morning.  Dispense: 30 tablet; Refill: 3 - insulin detemir (LEVEMIR FLEXPEN) 100 UNIT/ML FlexPen; Inject 4-10 Units into the skin daily.  Dispense: 3 mL; Refill: 3  4. Essential hypertension  - diltiazem (CARDIZEM CD) 180 MG 24 hr capsule; Take 1 capsule (180 mg total) by mouth daily.  Dispense: 30 capsule; Refill: 3 - losartan (COZAAR) 25 MG tablet; Take 1 tablet (25 mg total) by mouth at bedtime.  Dispense: 30 tablet; Refill:  3  5. Hypothyroidism due to medication  Last TSH at goal   6. Atherosclerosis of aorta ([place])  On statin therapy   7. Mucopurulent chronic bronchitis ([place])  She needs to be compliant with her inhalers , still smoking   8. Bipolar affective disorder, currently depressed, moderate ([place])   Continue follow up with [facility]

## 2022-12-26 NOTE — Telephone Encounter (Signed)
Referral to urology placed. Pt will be informed of CT results at visit on 12/27/22.

## 2022-12-27 ENCOUNTER — Inpatient Hospital Stay: Payer: 59

## 2022-12-27 ENCOUNTER — Inpatient Hospital Stay: Payer: 59 | Attending: Oncology

## 2022-12-27 DIAGNOSIS — Z7189 Other specified counseling: Secondary | ICD-10-CM

## 2022-12-27 DIAGNOSIS — C20 Malignant neoplasm of rectum: Secondary | ICD-10-CM | POA: Diagnosis not present

## 2022-12-27 LAB — CBC WITH DIFFERENTIAL/PLATELET
Abs Immature Granulocytes: 0.01 10*3/uL (ref 0.00–0.07)
Basophils Absolute: 0 10*3/uL (ref 0.0–0.1)
Basophils Relative: 1 %
Eosinophils Absolute: 0.2 10*3/uL (ref 0.0–0.5)
Eosinophils Relative: 4 %
HCT: 39.2 % (ref 36.0–46.0)
Hemoglobin: 13.4 g/dL (ref 12.0–15.0)
Immature Granulocytes: 0 %
Lymphocytes Relative: 16 %
Lymphs Abs: 0.7 10*3/uL (ref 0.7–4.0)
MCH: 33.4 pg (ref 26.0–34.0)
MCHC: 34.2 g/dL (ref 30.0–36.0)
MCV: 97.8 fL (ref 80.0–100.0)
Monocytes Absolute: 0.5 10*3/uL (ref 0.1–1.0)
Monocytes Relative: 11 %
Neutro Abs: 2.9 10*3/uL (ref 1.7–7.7)
Neutrophils Relative %: 68 %
Platelets: 120 10*3/uL — ABNORMAL LOW (ref 150–400)
RBC: 4.01 MIL/uL (ref 3.87–5.11)
RDW: 14.2 % (ref 11.5–15.5)
WBC: 4.2 10*3/uL (ref 4.0–10.5)
nRBC: 0 % (ref 0.0–0.2)

## 2022-12-27 LAB — COMPREHENSIVE METABOLIC PANEL
ALT: 11 U/L (ref 0–44)
AST: 16 U/L (ref 15–41)
Albumin: 3.9 g/dL (ref 3.5–5.0)
Alkaline Phosphatase: 76 U/L (ref 38–126)
Anion gap: 7 (ref 5–15)
BUN: 16 mg/dL (ref 6–20)
CO2: 29 mmol/L (ref 22–32)
Calcium: 9.3 mg/dL (ref 8.9–10.3)
Chloride: 103 mmol/L (ref 98–111)
Creatinine, Ser: 0.98 mg/dL (ref 0.44–1.00)
GFR, Estimated: >60 mL/min (ref >60)
Glucose, Bld: 124 mg/dL — ABNORMAL HIGH (ref 70–99)
Potassium: 4.3 mmol/L (ref 3.5–5.1)
Sodium: 139 mmol/L (ref 135–145)
Total Bilirubin: 0.5 mg/dL (ref 0.3–1.2)
Total Protein: 6.9 g/dL (ref 6.5–8.1)

## 2022-12-29 ENCOUNTER — Other Ambulatory Visit: Payer: Self-pay | Admitting: Oncology

## 2023-01-03 ENCOUNTER — Encounter: Payer: Self-pay | Admitting: Oncology

## 2023-01-03 ENCOUNTER — Encounter: Payer: Self-pay | Admitting: Nurse Practitioner

## 2023-01-08 ENCOUNTER — Ambulatory Visit (INDEPENDENT_AMBULATORY_CARE_PROVIDER_SITE_OTHER): Payer: 59 | Admitting: Urology

## 2023-01-08 ENCOUNTER — Inpatient Hospital Stay: Payer: 59

## 2023-01-08 ENCOUNTER — Telehealth: Payer: Self-pay

## 2023-01-08 ENCOUNTER — Encounter: Payer: Self-pay | Admitting: Urology

## 2023-01-08 VITALS — BP 124/85 | HR 95 | Ht 59.0 in | Wt 130.0 lb

## 2023-01-08 DIAGNOSIS — N200 Calculus of kidney: Secondary | ICD-10-CM

## 2023-01-08 DIAGNOSIS — C20 Malignant neoplasm of rectum: Secondary | ICD-10-CM | POA: Diagnosis not present

## 2023-01-08 DIAGNOSIS — N3281 Overactive bladder: Secondary | ICD-10-CM | POA: Diagnosis not present

## 2023-01-08 DIAGNOSIS — R3989 Other symptoms and signs involving the genitourinary system: Secondary | ICD-10-CM

## 2023-01-08 LAB — MICROSCOPIC EXAMINATION: WBC, UA: >30 /hpf — AB (ref 0–5)

## 2023-01-08 LAB — URINALYSIS, COMPLETE
Bilirubin, UA: NEGATIVE
Glucose, UA: NEGATIVE
Ketones, UA: NEGATIVE
Nitrite, UA: POSITIVE — AB
Protein,UA: NEGATIVE
Specific Gravity, UA: 1.01 (ref 1.005–1.030)
Urobilinogen, Ur: 0.2 mg/dL (ref 0.2–1.0)
pH, UA: 6 (ref 5.0–7.5)

## 2023-01-08 MED ORDER — OXYBUTYNIN CHLORIDE ER 10 MG PO TB24: 10.0000 mg | ORAL_TABLET | Freq: Every day | ORAL | 11 refills | Status: DC

## 2023-01-08 MED ORDER — CIPROFLOXACIN HCL 500 MG PO TABS: 500.0000 mg | ORAL_TABLET | Freq: Two times a day (BID) | ORAL | 0 refills | Status: AC

## 2023-01-08 NOTE — Telephone Encounter (Signed)
Called pt, husband answers and states that patient is asleep left message with husband as he is listed on DPR. RX sent.

## 2023-01-08 NOTE — Progress Notes (Signed)
01/08/23 1:53 PM   April Luna [birth year] [id number]  CC: Nephrolithiasis, overactive bladder  HPI: Comorbid 52 year old female who I last saw in July 2020 after she had undergone left-sided ureteroscopy for a nearly 2 cm UPJ stone.  Follow-up ultrasound showed no hydronephrosis or stones.  She was diagnosed with rectal cancer and was treated with surgery, colostomy, chemotherapy, and radiation.  She was referred back to urology for nonobstructing left renal stone seen on CT.  She also reports some overactive bladder symptoms of urgency and frequency since she was treated with radiation.  Urinalysis today is pending.  She denies any gross hematuria.  She has some bilateral low back pain but denies any kidney stone type flank pain.   PMH: Past Medical History:  Diagnosis Date   Allergy    pollen   Anxiety    Arthritis    right hip   Bipolar 1 disorder ([place])    Cancer (HCC)    rectal   Chemotherapy-induced neuropathy ([place]) 10/24/2022   Chronic kidney disease    COPD (chronic obstructive pulmonary disease) (HCC)    Depression    Family history of adverse reaction to anesthesia    grand father had a stroke during anesthesia   Family history of breast cancer    Family history of colon cancer    Family history of uterine cancer    GERD (gastroesophageal reflux disease)    History of kidney stones    Hyperlipidemia    Hypertension    Hypothyroidism    Panic attack    Pneumonia    Psoriasis    Sleep apnea 08/11/2021   No CPAP   Type 2 diabetes mellitus with microalbuminuria, without long-term current use of insulin ([place]) 06/24/2019    Surgical History: Past Surgical History:  Procedure Laterality Date   BREAST BIOPSY Left 12/14/2021   [place] bx, venus marker, path pending   CESAREAN SECTION     COLONOSCOPY WITH PROPOFOL N/A 02/09/2022   Procedure: COLONOSCOPY WITH PROPOFOL;  Surgeon: Lin Landsman, MD;  Location: [place];  Service: Endoscopy;   Laterality: N/A;  sleep apnea   CYSTOSCOPY W/ RETROGRADES Left 11/08/2018   Procedure: CYSTOSCOPY WITH RETROGRADE PYELOGRAM;  Surgeon: Billey Co, MD;  Location: [facility];  Service: Urology;  Laterality: Left;   CYSTOSCOPY/URETEROSCOPY/HOLMIUM LASER/STENT PLACEMENT Left 11/08/2018   Procedure: CYSTOSCOPY/URETEROSCOPY/HOLMIUM LASER/STENT PLACEMENT;  Surgeon: Billey Co, MD;  Location: [facility];  Service: Urology;  Laterality: Left;   IR IMAGING GUIDED PORT INSERTION  05/11/2022   MOUTH SURGERY     wisdom teeth extraction   MOUTH SURGERY     teeth removal   POLYPECTOMY N/A 02/09/2022   Procedure: POLYPECTOMY;  Surgeon: Lin Landsman, MD;  Location: [place];  Service: Endoscopy;  Laterality: N/A;   XI ROBOTIC ASSISTED LOWER ANTERIOR RESECTION N/A 04/21/2022   Procedure: XI ROBOTIC ASSISTED LOWER ANTERIOR RESECTION WITH COLOSTOMY, BILATERAL TAP BLOCK, ASSESSMENT OF TISSUE PERFUSSION WITH FIREFLY INJECTION;  Surgeon: Ileana Roup, MD;  Location[facility];  Service: General;  Laterality: N/A;    Family History: Family History  Problem Relation Age of Onset   Depression Mother    Anxiety disorder Mother    Diabetes Mother    Hypertension Mother    Hyperlipidemia Mother    Cancer Mother    Uterine cancer Mother 55   Cervical cancer Mother 104   Colon cancer Father    Depression Brother    Anxiety disorder Brother  Cancer Maternal Aunt        unk types   Diabetes Mellitus II Maternal Grandmother    Hypercholesterolemia Maternal Grandmother    Breast cancer Maternal Grandmother    Cancer Paternal Grandmother    Diabetes Paternal Grandmother    Melanoma Paternal Grandmother    Stomach cancer Paternal Grandmother     Social History:  reports that she has been smoking cigarettes. She started smoking about 37 years ago. She has a 8.50 pack-year smoking history. She has been exposed to tobacco smoke. She has never used smokeless tobacco. She reports that  she does not currently use alcohol after a past usage of about 4.0 standard drinks of alcohol per week. She reports that she does not currently use drugs after having used the following drugs: Marijuana.  Physical Exam: BP 124/85   Pulse 95   Ht '4\' 11"'$  (1.499 m)   Wt 130 lb (59 kg)   BMI 26.26 kg/m    Constitutional:  Alert and oriented, No acute distress. Cardiovascular: No clubbing, cyanosis, or edema. Respiratory: Normal respiratory effort, no increased work of breathing.  Pertinent Imaging: I have personally viewed and interpreted the CT scan dated 12/22/2022 showing nonobstructive left lower pole stones, unchanged from prior CT from March 2023, no hydronephrosis.  Assessment & Plan:   52 year old female being treated for rectal cancer, and also with history of kidney stones.  Stable nonobstructive left lower pole stones that are unchanged from prior CT in March 2023, she opts for observation at this time.  I think it is very reasonable especially with her other medical issues at this time.  Regarding her urinary symptoms, recommended checking a urinalysis to rule out infection, but she was also interested in starting an OAB medication.  We reviewed behavioral strategies, and I sent in oxybutynin 10 mg XL daily.  Risk and benefits were discussed.  Call with urine culture results Trial of oxybutynin 10 mg XL daily for OAB RTC 6 months symptom check, will review repeat imaging ordered by oncology at that time regarding nonobstructive left-sided renal stones   Nickolas Madrid, MD 01/08/2023  [facility] [address] [place], [place] [address] [phone]

## 2023-01-08 NOTE — Patient Instructions (Signed)

## 2023-01-08 NOTE — Telephone Encounter (Signed)
-----  Message from Billey Co, MD sent at 01/08/2023  4:50 PM EST ----- Please start Cipro 500 mg twice daily x 5 days for UTI, she should also take the oxybutynin as prescribed for overactive bladder  Nickolas Madrid, MD 01/08/2023

## 2023-01-09 NOTE — Progress Notes (Signed)
This encounter was created in error - please disregard. This encounter was created in error - please disregard.

## 2023-01-12 LAB — CULTURE, URINE COMPREHENSIVE

## 2023-01-16 ENCOUNTER — Telehealth: Payer: Self-pay

## 2023-01-16 NOTE — Telephone Encounter (Signed)
-----  Message from Billey Co, MD sent at 01/16/2023  8:12 AM EST ----- Culture did show UTI, recommend nitrofurantoin '100mg'$  BID X 7 days, thanks  Nickolas Madrid, MD 01/16/2023

## 2023-01-16 NOTE — Telephone Encounter (Signed)
Pt treated previously w/ Cipro per verbal from [facility] no need to send Nitrofurantoin.

## 2023-01-21 DIAGNOSIS — Z933 Colostomy status: Secondary | ICD-10-CM | POA: Diagnosis not present

## 2023-01-22 ENCOUNTER — Inpatient Hospital Stay: Payer: 59

## 2023-01-22 DIAGNOSIS — C20 Malignant neoplasm of rectum: Secondary | ICD-10-CM

## 2023-01-22 DIAGNOSIS — Z7189 Other specified counseling: Secondary | ICD-10-CM

## 2023-01-22 DIAGNOSIS — Z95828 Presence of other vascular implants and grafts: Secondary | ICD-10-CM

## 2023-01-22 LAB — CBC WITH DIFFERENTIAL/PLATELET
Abs Immature Granulocytes: 0.03 10*3/uL (ref 0.00–0.07)
Basophils Absolute: 0.1 10*3/uL (ref 0.0–0.1)
Basophils Relative: 1 %
Eosinophils Absolute: 0.2 10*3/uL (ref 0.0–0.5)
Eosinophils Relative: 2 %
HCT: 41.5 % (ref 36.0–46.0)
Hemoglobin: 14.2 g/dL (ref 12.0–15.0)
Immature Granulocytes: 0 %
Lymphocytes Relative: 10 %
Lymphs Abs: 0.9 10*3/uL (ref 0.7–4.0)
MCH: 33.7 pg (ref 26.0–34.0)
MCHC: 34.2 g/dL (ref 30.0–36.0)
MCV: 98.6 fL (ref 80.0–100.0)
Monocytes Absolute: 0.9 10*3/uL (ref 0.1–1.0)
Monocytes Relative: 11 %
Neutro Abs: 6.6 10*3/uL (ref 1.7–7.7)
Neutrophils Relative %: 76 %
Platelets: 144 10*3/uL — ABNORMAL LOW (ref 150–400)
RBC: 4.21 MIL/uL (ref 3.87–5.11)
RDW: 14.4 % (ref 11.5–15.5)
WBC: 8.7 10*3/uL (ref 4.0–10.5)
nRBC: 0 % (ref 0.0–0.2)

## 2023-01-22 LAB — COMPREHENSIVE METABOLIC PANEL
ALT: 14 U/L (ref 0–44)
AST: 26 U/L (ref 15–41)
Albumin: 3.8 g/dL (ref 3.5–5.0)
Alkaline Phosphatase: 68 U/L (ref 38–126)
Anion gap: 10 (ref 5–15)
BUN: 9 mg/dL (ref 6–20)
CO2: 24 mmol/L (ref 22–32)
Calcium: 8.9 mg/dL (ref 8.9–10.3)
Chloride: 102 mmol/L (ref 98–111)
Creatinine, Ser: 0.9 mg/dL (ref 0.44–1.00)
GFR, Estimated: >60 mL/min (ref >60)
Glucose, Bld: 118 mg/dL — ABNORMAL HIGH (ref 70–99)
Potassium: 3.7 mmol/L (ref 3.5–5.1)
Sodium: 136 mmol/L (ref 135–145)
Total Bilirubin: 0.4 mg/dL (ref 0.3–1.2)
Total Protein: 6.8 g/dL (ref 6.5–8.1)

## 2023-01-22 MED ORDER — HEPARIN SOD (PORK) LOCK FLUSH 100 UNIT/ML IV SOLN
500.0000 [IU] | Freq: Once | INTRAVENOUS | Status: AC
  Administered 2023-01-22: 500 [IU] via INTRAVENOUS
  Filled 2023-01-22: qty 5

## 2023-01-22 MED ORDER — SODIUM CHLORIDE 0.9% FLUSH
10.0000 mL | Freq: Once | INTRAVENOUS | Status: AC
  Administered 2023-01-22: 10 mL via INTRAVENOUS
  Filled 2023-01-22: qty 10

## 2023-01-29 DIAGNOSIS — Z933 Colostomy status: Secondary | ICD-10-CM | POA: Diagnosis not present

## 2023-03-05 ENCOUNTER — Encounter: Payer: Self-pay | Admitting: Oncology

## 2023-03-05 ENCOUNTER — Inpatient Hospital Stay: Payer: 59 | Attending: Oncology

## 2023-03-05 ENCOUNTER — Inpatient Hospital Stay (HOSPITAL_BASED_OUTPATIENT_CLINIC_OR_DEPARTMENT_OTHER): Payer: 59 | Admitting: Oncology

## 2023-03-05 ENCOUNTER — Inpatient Hospital Stay: Payer: 59

## 2023-03-05 ENCOUNTER — Other Ambulatory Visit: Payer: Medicare Other

## 2023-03-05 VITALS — BP 107/80 | HR 78 | Temp 96.1°F | Resp 18 | Wt 126.9 lb

## 2023-03-05 DIAGNOSIS — C20 Malignant neoplasm of rectum: Secondary | ICD-10-CM | POA: Diagnosis not present

## 2023-03-05 DIAGNOSIS — Z95828 Presence of other vascular implants and grafts: Secondary | ICD-10-CM

## 2023-03-05 DIAGNOSIS — Z720 Tobacco use: Secondary | ICD-10-CM

## 2023-03-05 DIAGNOSIS — E1122 Type 2 diabetes mellitus with diabetic chronic kidney disease: Secondary | ICD-10-CM | POA: Diagnosis not present

## 2023-03-05 DIAGNOSIS — I129 Hypertensive chronic kidney disease with stage 1 through stage 4 chronic kidney disease, or unspecified chronic kidney disease: Secondary | ICD-10-CM | POA: Diagnosis not present

## 2023-03-05 DIAGNOSIS — Z87891 Personal history of nicotine dependence: Secondary | ICD-10-CM | POA: Diagnosis not present

## 2023-03-05 DIAGNOSIS — K625 Hemorrhage of anus and rectum: Secondary | ICD-10-CM

## 2023-03-05 DIAGNOSIS — N189 Chronic kidney disease, unspecified: Secondary | ICD-10-CM | POA: Diagnosis not present

## 2023-03-05 DIAGNOSIS — Z800 Family history of malignant neoplasm of digestive organs: Secondary | ICD-10-CM | POA: Diagnosis not present

## 2023-03-05 DIAGNOSIS — Z8049 Family history of malignant neoplasm of other genital organs: Secondary | ICD-10-CM | POA: Diagnosis not present

## 2023-03-05 DIAGNOSIS — G620 Drug-induced polyneuropathy: Secondary | ICD-10-CM | POA: Diagnosis not present

## 2023-03-05 DIAGNOSIS — T451X5A Adverse effect of antineoplastic and immunosuppressive drugs, initial encounter: Secondary | ICD-10-CM

## 2023-03-05 DIAGNOSIS — Z803 Family history of malignant neoplasm of breast: Secondary | ICD-10-CM | POA: Diagnosis not present

## 2023-03-05 LAB — COMPREHENSIVE METABOLIC PANEL
ALT: 12 U/L (ref 0–44)
AST: 21 U/L (ref 15–41)
Albumin: 4.1 g/dL (ref 3.5–5.0)
Alkaline Phosphatase: 93 U/L (ref 38–126)
Anion gap: 9 (ref 5–15)
BUN: 14 mg/dL (ref 6–20)
CO2: 25 mmol/L (ref 22–32)
Calcium: 9.1 mg/dL (ref 8.9–10.3)
Chloride: 101 mmol/L (ref 98–111)
Creatinine, Ser: 0.94 mg/dL (ref 0.44–1.00)
GFR, Estimated: >60 mL/min (ref >60)
Glucose, Bld: 153 mg/dL — ABNORMAL HIGH (ref 70–99)
Potassium: 3.8 mmol/L (ref 3.5–5.1)
Sodium: 135 mmol/L (ref 135–145)
Total Bilirubin: 0.5 mg/dL (ref 0.3–1.2)
Total Protein: 7.3 g/dL (ref 6.5–8.1)

## 2023-03-05 LAB — CBC WITH DIFFERENTIAL/PLATELET
Abs Immature Granulocytes: 0.03 10*3/uL (ref 0.00–0.07)
Basophils Absolute: 0.1 10*3/uL (ref 0.0–0.1)
Basophils Relative: 1 %
Eosinophils Absolute: 0.1 10*3/uL (ref 0.0–0.5)
Eosinophils Relative: 1 %
HCT: 44.1 % (ref 36.0–46.0)
Hemoglobin: 15.1 g/dL — ABNORMAL HIGH (ref 12.0–15.0)
Immature Granulocytes: 0 %
Lymphocytes Relative: 8 %
Lymphs Abs: 0.7 10*3/uL (ref 0.7–4.0)
MCH: 33.3 pg (ref 26.0–34.0)
MCHC: 34.2 g/dL (ref 30.0–36.0)
MCV: 97.1 fL (ref 80.0–100.0)
Monocytes Absolute: 0.7 10*3/uL (ref 0.1–1.0)
Monocytes Relative: 8 %
Neutro Abs: 7.1 10*3/uL (ref 1.7–7.7)
Neutrophils Relative %: 82 %
Platelets: 153 10*3/uL (ref 150–400)
RBC: 4.54 MIL/uL (ref 3.87–5.11)
RDW: 13 % (ref 11.5–15.5)
WBC: 8.7 10*3/uL (ref 4.0–10.5)
nRBC: 0 % (ref 0.0–0.2)

## 2023-03-05 MED ORDER — SODIUM CHLORIDE 0.9% FLUSH
10.0000 mL | INTRAVENOUS | Status: DC | PRN
  Administered 2023-03-05: 10 mL via INTRAVENOUS
  Filled 2023-03-05: qty 10

## 2023-03-05 MED ORDER — HEPARIN SOD (PORK) LOCK FLUSH 100 UNIT/ML IV SOLN
500.0000 [IU] | Freq: Once | INTRAVENOUS | Status: AC
  Administered 2023-03-05: 500 [IU] via INTRAVENOUS
  Filled 2023-03-05: qty 5

## 2023-03-05 NOTE — Assessment & Plan Note (Addendum)
Stage III. pT3 pN1b cM0, s/p APR. Currently on adjuvant chemotherapy. S/p FOLFOX x 4 cycles 09/12/22 S/p concurrent chemotherapy [Xeloda 1300 mg BID] with RT  Finished adjuvant FOLFOX [dose reduced oxaliplatin and omit 5-FU bolus December 2023 CT scan shows no recurrence. Labs reviewed and discussed with patient Repeat CT chest abdomen pelvis in June

## 2023-03-05 NOTE — Assessment & Plan Note (Signed)
Recently quitted.  Encouraged her cessation effort.   

## 2023-03-05 NOTE — Assessment & Plan Note (Addendum)
Per my examination, no perianal abnormality externally.  I recommend patient to see colorectal surgeon for evaluation.  discussed with Dr. Dema Severin, will obtain MRI rectum wo contrast

## 2023-03-05 NOTE — Assessment & Plan Note (Signed)
Grade 1, stabe 

## 2023-03-05 NOTE — Progress Notes (Signed)
Pt here for follow up. She reports that rectal area may have opened because she has had occasional bleeding

## 2023-03-05 NOTE — Assessment & Plan Note (Signed)
Port not aspirate well, She will have cathflo later this week, if still no aspiration, plan Dye study

## 2023-03-05 NOTE — Progress Notes (Signed)
Hematology/Oncology Progress note [phone] [phone]      CHIEF COMPLAINTS/REASON FOR VISIT:  Follow-up for rectal cancer treatments.  ASSESSMENT & PLAN:   Cancer Staging  Rectal cancer [facility]) Staging form: Colon and Rectum, AJCC 8th Edition - Pathologic stage from 05/03/2022: Stage IIIB (pT3, pN1b, cM0) - Signed by [name], MD on 05/03/2022   Rectal cancer ([place]) Stage III. pT3 pN1b cM0, s/p APR. Currently on adjuvant chemotherapy. S/p FOLFOX x 4 cycles 09/12/22 S/p concurrent chemotherapy [Xeloda 1300 mg BID] with RT  Finished adjuvant FOLFOX [dose reduced oxaliplatin and omit 5-FU bolus December 2023 CT scan shows no recurrence. Labs reviewed and discussed with patient Repeat CT chest abdomen pelvis in June    Chemotherapy-induced neuropathy (HCC) Grade 1, stabe  Tobacco use Recently quitted.  Encouraged her cessation effort.    Rectal bleeding Per my examination, no perianal abnormality externally.  I recommend patient to see colorectal surgeon for evaluation.  discussed with Dr. Dema Severin, will obtain MRI rectum wo contrast   Port-A-Cath in place Port not aspirate well, She will have cathflo later this week, if still no aspiration, plan Dye study  Orders Placed This Encounter  Procedures   MR PELVIS WO CM RECTAL CA STAGING    Standing Status:   Future    Standing Expiration Date:   03/04/2024    Order Specific Question:   If indicated for the ordered procedure, I authorize the administration of contrast media per Radiology protocol    Answer:   Yes    Order Specific Question:   What is the patient's sedation requirement?    Answer:   No Sedation    Order Specific Question:   Does the patient have a pacemaker or implanted devices?    Answer:   No    Order Specific Question:   Preferred imaging location?    Answer:   [facility] (table limit - 550lbs)   CT Chest W Contrast    Standing Status:   Future    Standing Expiration Date:    03/04/2024    Scheduling Instructions:     To be scheduled a few days prior to seeing Dr. Tasia Catchings    Order Specific Question:   If indicated for the ordered procedure, I authorize the administration of contrast media per Radiology protocol    Answer:   Yes    Order Specific Question:   Does the patient have a contrast media/X-ray dye allergy?    Answer:   No    Order Specific Question:   Is patient pregnant?    Answer:   No    Order Specific Question:   Preferred imaging location?    Answer:   [facility]   CT Abdomen W Contrast    Standing Status:   Future    Standing Expiration Date:   03/04/2024    Scheduling Instructions:     To be scheduled a few days prior to seeing Dr. Tasia Catchings    Order Specific Question:   If indicated for the ordered procedure, I authorize the administration of contrast media per Radiology protocol    Answer:   Yes    Order Specific Question:   Does the patient have a contrast media/X-ray dye allergy?    Answer:   No    Order Specific Question:   Is patient pregnant?    Answer:   No    Order Specific Question:   Preferred imaging location?    Answer:   [facility]    Order Specific Question:   Is Oral Contrast requested for this exam?    Answer:   Yes, Per Radiology protocol   CBC with Differential ([place] Only)    Standing Status:   Future    Standing Expiration Date:   03/04/2024   CEA    Standing Status:   Future    Standing Expiration Date:   03/04/2024   CMP ([place] only)    Standing Status:   Future    Standing Expiration Date:   03/04/2024    Follow-up in 3 months.   All questions were answered. The patient knows to call the clinic with any problems, questions or concerns.  [name], MD, PhD [facility] Hematology Oncology 03/05/2023      HISTORY OF PRESENTING ILLNESS:   April Luna is a  52 y.o.  female presents for treatment of rectal cancer Oncology History  Rectal cancer ([place])  02/26/2022 Imaging   MRI PELVIS  WITHOUT CONTRAST- By imaging, rectal cancer stage:  T1/T2, N0, Mx    03/02/2022 Imaging   CT CHEST AND ABDOMEN WITH CONTRAST 1. No convincing evidence of metastatic disease within the chest or abdomen. 2. Atrophic left kidney with multifocal renal scarring and cortical calcifications as well as nonobstructive renal stones measuring up to 5 mm. 3. Prominent left-sided predominant retroperitoneal lymph nodes measuring up to 8 mm near the level of the renal hilum, overall decreased in size dating back to CT September 19, 2018 and favored reactive related to left renal inflammation. 4.  Aortic Atherosclerosis (ICD10-I70.0).   03/27/2022 Genetic Testing    [facility] CustomNext+RNA cancer panel found no pathogenic mutations.    04/21/2022 Initial Diagnosis   Rectal cancer - baseline CEA 3.6 -02/09/2022, patient had colonoscopy which showed renal mass 10 cm from anal verge.  5 mm polyp in ascending colon.  Removed and retrieved. Pathology showed rectal adenocarcinoma.  The polyp in the ascending colon is a tubular adenoma. -04/21/2022, patient underwent robotic assisted ultralow anterior resection. Pathology showed moderately differentiated adenocarcinoma, 4.5 cm in maximal extent, with focal extension through muscularis propria into perirectal soft tissue.  3 lymph nodes positive for metastatic carcinoma.  Negative margin.  pT3 pN1b, MSI stable.   05/03/2022 Cancer Staging   Staging form: Colon and Rectum, AJCC 8th Edition - Pathologic stage from 05/03/2022: Stage IIIB (pT3, pN1b, cM0) - Signed by [name], MD on 05/03/2022 Stage prefix: Initial diagnosis   05/11/2022 Miscellaneous   Medi port placed by Dr.White   05/26/2022 -  Chemotherapy   FOLFOX Q2 weeks x 4   05/26/2022 - 07/09/2022 Chemotherapy   Patient is on Treatment Plan : COLORECTAL FOLFOX q14d x 8 cycles     05/26/2022 - 12/13/2022 Chemotherapy   Patient is on Treatment Plan : COLORECTAL FOLFOX q14d x 4 months     07/27/2022 -  Chemotherapy    Concurrent chemotherapy- Xeloda  '1300mg'$  BID and radiation.    07/27/2022 - 09/12/2022 Radiation Therapy    concurrent chemotherapy [Xeloda 1300 mg BID] with RT    09/20/2022 Imaging   CT Angiogram chest PET protocol There is no evidence of pulmonary artery embolism. There is no evidence of thoracic aortic dissection.   Small linear patchy alveolar infiltrate is seen in medial segment of right middle lobe suggesting atelectasis/pneumonia.     09/28/2022 - 09/30/2022 Hospital Admission   Admission due to acute respiratory failure with hypoxia, COPD exacerbation   09/28/2022 Imaging   CT  angio chest PE protocol 1. Negative for acute PE or thoracic aortic dissection. 2. New ground-glass infiltrates anteriorly in bilateral upper lobes,may represent atypical edema, infectious or inflammatory process. 3.  Aortic Atherosclerosis     12/22/2022 Imaging   CT chest abdomen pelvis w contrast  Stable exam. No evidence of recurrent or metastatic carcinoma within the chest, abdomen, or pelvis.   Left nephrolithiasis and renal parenchymal atrophy. No evidence of ureteral calculi or hydronephrosis.   Aortic Atherosclerosis   Patient has bipolar and schizophrenia..  Patient is married and lives at home with her husband and son.  INTERVAL HISTORY April Luna is a 52 y.o. female who has above history reviewed by me today presents for follow up visit for rectal cancer.  She feels well today. She has rectal bleeding yesterday, she feels may her rectal area was "open".  No nausea vomiting, abdominal pain.   Review of Systems  Constitutional:  Positive for fatigue. Negative for appetite change, chills and fever.  HENT:   Negative for hearing loss and voice change.   Eyes:  Negative for eye problems.  Respiratory:  Negative for chest tightness, cough and shortness of breath.   Cardiovascular:  Negative for chest pain.  Gastrointestinal:  Negative for abdominal distention, abdominal pain and  blood in stool.       Rectal bleeding.  + colostomy  Endocrine: Negative for hot flashes.  Genitourinary:  Negative for difficulty urinating and frequency.   Musculoskeletal:  Negative for arthralgias.  Skin:  Negative for itching and rash.  Neurological:  Positive for numbness. Negative for extremity weakness.  Hematological:  Negative for adenopathy.  Psychiatric/Behavioral:  Negative for confusion.     MEDICAL HISTORY:  Past Medical History:  Diagnosis Date   Allergy    pollen   Anxiety    Arthritis    right hip   Bipolar 1 disorder ([place])    Cancer (HCC)    rectal   Chemotherapy-induced neuropathy ([place]) 10/24/2022   Chronic kidney disease    COPD (chronic obstructive pulmonary disease) (HCC)    Depression    Family history of adverse reaction to anesthesia    grand father had a stroke during anesthesia   Family history of breast cancer    Family history of colon cancer    Family history of uterine cancer    GERD (gastroesophageal reflux disease)    History of kidney stones    Hyperlipidemia    Hypertension    Hypothyroidism    Panic attack    Pneumonia    Psoriasis    Sleep apnea 08/11/2021   No CPAP   Type 2 diabetes mellitus with microalbuminuria, without long-term current use of insulin ([place]) 06/24/2019    SURGICAL HISTORY: Past Surgical History:  Procedure Laterality Date   BREAST BIOPSY Left 12/14/2021   [place] bx, venus marker, path pending   CESAREAN SECTION     COLONOSCOPY WITH PROPOFOL N/A 02/09/2022   Procedure: COLONOSCOPY WITH PROPOFOL;  Surgeon: Lin Landsman, MD;  Location: [facility];  Service: Endoscopy;  Laterality: N/A;  sleep apnea   CYSTOSCOPY W/ RETROGRADES Left 11/08/2018   Procedure: CYSTOSCOPY WITH RETROGRADE PYELOGRAM;  Surgeon: Billey Co, MD;  Location: [facility];  Service: Urology;  Laterality: Left;   CYSTOSCOPY/URETEROSCOPY/HOLMIUM LASER/STENT PLACEMENT Left 11/08/2018   Procedure: CYSTOSCOPY/URETEROSCOPY/HOLMIUM  LASER/STENT PLACEMENT;  Surgeon: Billey Co, MD;  Location: [facility];  Service: Urology;  Laterality: Left;   IR IMAGING GUIDED PORT INSERTION  05/11/2022  MOUTH SURGERY     wisdom teeth extraction   MOUTH SURGERY     teeth removal   POLYPECTOMY N/A 02/09/2022   Procedure: POLYPECTOMY;  Surgeon: Lin Landsman, MD;  Location: [facility];  Service: Endoscopy;  Laterality: N/A;   XI ROBOTIC ASSISTED LOWER ANTERIOR RESECTION N/A 04/21/2022   Procedure: XI ROBOTIC ASSISTED LOWER ANTERIOR RESECTION WITH COLOSTOMY, BILATERAL TAP BLOCK, ASSESSMENT OF TISSUE PERFUSSION WITH FIREFLY INJECTION;  Surgeon: Ileana Roup, MD;  Location: [facility];  Service: General;  Laterality: N/A;    SOCIAL HISTORY: Social History   Socioeconomic History   Marital status: Married    Spouse name: Montine Circle   Number of children: 1   Years of education: 12   Highest education level: High school graduate  Occupational History   Occupation: unemployed    Comment: disabled  Tobacco Use   Smoking status: Some Days    Packs/day: 0.25    Years: 34.00    Total pack years: 8.50    Types: Cigarettes    Start date: 05/13/1985    Passive exposure: Current   Smokeless tobacco: Never   Tobacco comments:    5-6 cigarettes weekly- 11/07/2022  Vaping Use   Vaping Use: Former   Start date: 05/25/2018   Quit date: 09/24/2018  Substance and Sexual Activity   Alcohol use: Not Currently    Alcohol/week: 4.0 standard drinks of alcohol    Types: 4 Glasses of wine per week    Comment: quit ETOH in Feb. 2023   Drug use: Not Currently    Types: Marijuana    Comment: 2 days ago   Sexual activity: Not Currently    Birth control/protection: Post-menopausal  Other Topics Concern   Not on file  Social History Narrative   Lives with husband and son    Social Determinants of Health   Financial Resource Strain: High Risk (08/25/2022)   Overall Financial Resource Strain (CARDIA)    Difficulty of  Paying Living Expenses: Hard  Food Insecurity: No Food Insecurity (09/20/2022)   Hunger Vital Sign    Worried About Running Out of Food in the Last Year: Never true    [place] in the Last Year: Never true  Recent Concern: [place] Present (08/25/2022)   Hunger Vital Sign    Worried About Running Out of Food in the Last Year: Sometimes true    Ran Out of Food in the Last Year: Sometimes true  Transportation Needs: Unmet Transportation Needs (01/22/2023)   PRAPARE - Transportation    Lack of Transportation (Medical): Yes    Lack of Transportation (Non-Medical): Yes  Physical Activity: Insufficiently Active (08/25/2022)   Exercise Vital Sign    Days of Exercise per Week: 1 day    Minutes of Exercise per Session: 10 min  Stress: Stress Concern Present (08/25/2022)   [employer] [place]    Feeling of Stress : To some extent  Social Connections: Moderately Integrated (08/25/2022)   Social Connection and Isolation Panel [NHANES]    Frequency of Communication with Friends and Family: More than three times a week    Frequency of Social Gatherings with Friends and Family: Twice a week    Attends Religious Services: Never    Marine scientist or Organizations: No    Attends Music therapist: 1 to 4 times per year    Marital Status: Married  Human resources officer Violence:  Not At Risk (09/20/2022)   Humiliation, Afraid, Rape, and Kick questionnaire    Fear of Current or Ex-Partner: No    Emotionally Abused: No    Physically Abused: No    Sexually Abused: No    FAMILY HISTORY: Family History  Problem Relation Age of Onset   Depression Mother    Anxiety disorder Mother    Diabetes Mother    Hypertension Mother    Hyperlipidemia Mother    Cancer Mother    Uterine cancer Mother 20   Cervical cancer Mother 17   Colon cancer Father    Depression Brother    Anxiety disorder Brother    Cancer  Maternal Aunt        unk types   Diabetes Mellitus II Maternal Grandmother    Hypercholesterolemia Maternal Grandmother    Breast cancer Maternal Grandmother    Cancer Paternal Grandmother    Diabetes Paternal Grandmother    Melanoma Paternal Grandmother    Stomach cancer Paternal Grandmother     ALLERGIES:  is allergic to metformin and related, nsaids, perphenazine, sulfa antibiotics, abilify [aripiprazole], and penicillins.  MEDICATIONS:  Current Outpatient Medications  Medication Sig Dispense Refill   ACCU-CHEK GUIDE test strip USE UP TO FOUR TIMES DAILY AS DIRECTED. 100 each 5   acetaminophen (TYLENOL) 325 MG tablet Take 650 mg by mouth every 6 (six) hours as needed for headache (pain).     albuterol (PROVENTIL) (2.5 MG/3ML) 0.083% nebulizer solution Take 3 mLs (2.5 mg total) by nebulization every 6 (six) hours as needed for shortness of breath or wheezing. 75 mL 0   albuterol (VENTOLIN HFA) 108 (90 Base) MCG/ACT inhaler Inhale 2 puffs into the lungs every 6 (six) hours as needed for wheezing or shortness of breath. 1 each 5   amantadine (SYMMETREL) 100 MG capsule Take 100 mg by mouth 2 (two) times daily.     blood glucose meter kit and supplies Dispense based on patient and insurance preference. Use up to four times daily as directed. (FOR ICD-10 E10.9, E11.9). 1 each 0   Budeson-Glycopyrrol-Formoterol (BREZTRI AEROSPHERE) 160-9-4.8 MCG/ACT AERO Inhale 2 puffs into the lungs in the morning and at bedtime. 5.9 g 11   buPROPion (WELLBUTRIN XL) 300 MG 24 hr tablet Take 300 mg by mouth daily.     Cholecalciferol (VITAMIN D-3) 125 MCG (5000 UT) TABS Take 5,000 Units by mouth daily. 30 tablet 1   dapagliflozin propanediol (FARXIGA) 10 MG TABS tablet Take 1 tablet (10 mg total) by mouth daily before breakfast. 30 tablet 3   diltiazem (CARDIZEM CD) 180 MG 24 hr capsule Take 1 capsule (180 mg total) by mouth daily. 30 capsule 3   docusate sodium (COLACE) 100 MG capsule TAKE 1 CAPSULE BY MOUTH  TWICE DAILY 60 capsule 1   gabapentin (NEURONTIN) 300 MG capsule Take 300 mg by mouth 2 (two) times daily.     hydrocortisone cream 0.5 % Apply 1 Application topically 2 (two) times daily as needed for itching. 30 g 1   Hydrocortisone, Perianal, 1 % CREA Apply topically.     hydrOXYzine (VISTARIL) 50 MG capsule Take 50 mg by mouth 3 (three) times daily as needed for anxiety.     icosapent Ethyl (VASCEPA) 1 g capsule TAKE 2 CAPSULES BY MOUTH 2 TIMES DAILY 120 capsule 3   insulin detemir (LEVEMIR FLEXPEN) 100 UNIT/ML FlexPen Inject 4-10 Units into the skin daily. 3 mL 3   Insulin Pen Needle 32G X 4 MM MISC 1 each  by Does not apply route daily at 12 noon. 100 each 0   ipratropium-albuterol (DUONEB) 0.5-2.5 (3) MG/3ML SOLN Take 3 mLs by nebulization every 6 (six) hours as needed. 360 mL 11   levothyroxine (SYNTHROID) 75 MCG tablet TAKE 1 TABLET BY MOUTH DAILY BEFORE BREAKFAST 90 tablet 2   loratadine (CLARITIN) 10 MG tablet Take 10 mg by mouth every morning.     losartan (COZAAR) 25 MG tablet Take 1 tablet (25 mg total) by mouth at bedtime. 30 tablet 3   NON FORMULARY Pt uses a cpap nightly     oxybutynin (DITROPAN-XL) 10 MG 24 hr tablet Take 1 tablet (10 mg total) by mouth daily. 30 tablet 11   paliperidone (INVEGA SUSTENNA) 234 MG/1.5ML SUSY injection Inject 234 mg into the muscle every 30 (thirty) days. On or about the 14th of each month     pantoprazole (PROTONIX) 40 MG tablet TAKE 1 TABLET BY MOUTH DAILY 90 tablet 2   Pediatric Multivit-Minerals-C (CHEWABLES MULTIVITAMIN PO) Take 1 tablet by mouth daily.     pioglitazone (ACTOS) 15 MG tablet Take 1 tablet (15 mg total) by mouth daily. 30 tablet 3   rosuvastatin (CRESTOR) 40 MG tablet Take 1 tablet (40 mg total) by mouth every morning. 30 tablet 3   sertraline (ZOLOFT) 100 MG tablet Take 200 mg by mouth every morning.     Spacer/Aero-Holding Chambers (AEROCHAMBER MV) inhaler Use as instructed 1 each 0   nicotine (NICODERM CQ - DOSED IN MG/24  HOURS) 21 mg/24hr patch Aplly '21mg'$  patch chest wall daily (okay to substitute generic) (Patient not taking: Reported on 03/05/2023) 28 patch 0   No current facility-administered medications for this visit.     PHYSICAL EXAMINATION: ECOG PERFORMANCE STATUS: 0 - Asymptomatic Vitals:   03/05/23 1017  BP: 107/80  Pulse: 78  Resp: 18  Temp: (!) 96.1 F (35.6 C)  SpO2: 97%    Filed Weights   03/05/23 1017  Weight: 126 lb 14.4 oz (57.6 kg)     Physical Exam Constitutional:      General: She is not in acute distress. HENT:     Head: Normocephalic and atraumatic.  Eyes:     General: No scleral icterus. Cardiovascular:     Rate and Rhythm: Normal rate and regular rhythm.     Heart sounds: Normal heart sounds.  Pulmonary:     Effort: Pulmonary effort is normal. No respiratory distress.     Breath sounds: No wheezing.     Comments: Decreased breath sound bilaterally Abdominal:     General: Bowel sounds are normal. There is no distension.     Palpations: Abdomen is soft.     Comments: +colostomy   Musculoskeletal:        General: No deformity. Normal range of motion.     Cervical back: Normal range of motion and neck supple.  Skin:    General: Skin is warm and dry.     Findings: No erythema or rash.  Neurological:     Mental Status: She is alert and oriented to person, place, and time. Mental status is at baseline.     Cranial Nerves: No cranial nerve deficit.     Coordination: Coordination normal.  Psychiatric:        Mood and Affect: Mood normal.     LABORATORY DATA:  I have reviewed the data as listed    Latest Ref Rng & Units 03/05/2023   10:00 AM 01/22/2023   10:09 AM 12/27/2022  1:31 PM  CBC  WBC 4.0 - 10.5 K/uL 8.7  8.7  4.2   Hemoglobin 12.0 - 15.0 g/dL 15.1  14.2  13.4   Hematocrit 36.0 - 46.0 % 44.1  41.5  39.2   Platelets 150 - 400 K/uL 153  144  120       Latest Ref Rng & Units 03/05/2023   10:00 AM 01/22/2023   10:09 AM 12/27/2022    1:31 PM  CMP   Glucose 70 - 99 mg/dL 153  118  124   BUN 6 - 20 mg/dL '14  9  16   '$ Creatinine 0.44 - 1.00 mg/dL 0.94  0.90  0.98   Sodium 135 - 145 mmol/L 135  136  139   Potassium 3.5 - 5.1 mmol/L 3.8  3.7  4.3   Chloride 98 - 111 mmol/L 101  102  103   CO2 22 - 32 mmol/L '25  24  29   '$ Calcium 8.9 - 10.3 mg/dL 9.1  8.9  9.3   Total Protein 6.5 - 8.1 g/dL 7.3  6.8  6.9   Total Bilirubin 0.3 - 1.2 mg/dL 0.5  0.4  0.5   Alkaline Phos 38 - 126 U/L 93  68  76   AST 15 - 41 U/L '21  26  16   '$ ALT 0 - 44 U/L '12  14  11         '$ RADIOGRAPHIC STUDIES: I have personally reviewed the radiological images as listed and agreed with the findings in the report. No results found.

## 2023-03-06 ENCOUNTER — Other Ambulatory Visit: Payer: Self-pay

## 2023-03-06 ENCOUNTER — Telehealth: Payer: Self-pay

## 2023-03-06 NOTE — Telephone Encounter (Signed)
Per Dr. Tasia Catchings, she spoke to surgeon Dr.White and he recommends to obtain MRI rectum wo contrast now.   Called and informed patient of recommendation and appt details. She verbalized understanding and will pick up appt details on 3/15 as well.

## 2023-03-07 LAB — CEA: CEA: 4.7 ng/mL (ref 0.0–4.7)

## 2023-03-08 ENCOUNTER — Inpatient Hospital Stay: Payer: 59

## 2023-03-09 ENCOUNTER — Inpatient Hospital Stay: Payer: 59

## 2023-03-09 VITALS — BP 104/65 | HR 68 | Temp 96.4°F | Resp 18

## 2023-03-09 DIAGNOSIS — C20 Malignant neoplasm of rectum: Secondary | ICD-10-CM | POA: Diagnosis not present

## 2023-03-09 DIAGNOSIS — I129 Hypertensive chronic kidney disease with stage 1 through stage 4 chronic kidney disease, or unspecified chronic kidney disease: Secondary | ICD-10-CM | POA: Diagnosis not present

## 2023-03-09 DIAGNOSIS — Z87891 Personal history of nicotine dependence: Secondary | ICD-10-CM | POA: Diagnosis not present

## 2023-03-09 DIAGNOSIS — Z803 Family history of malignant neoplasm of breast: Secondary | ICD-10-CM | POA: Diagnosis not present

## 2023-03-09 DIAGNOSIS — G620 Drug-induced polyneuropathy: Secondary | ICD-10-CM | POA: Diagnosis not present

## 2023-03-09 DIAGNOSIS — E1122 Type 2 diabetes mellitus with diabetic chronic kidney disease: Secondary | ICD-10-CM | POA: Diagnosis not present

## 2023-03-09 DIAGNOSIS — Z800 Family history of malignant neoplasm of digestive organs: Secondary | ICD-10-CM | POA: Diagnosis not present

## 2023-03-09 DIAGNOSIS — N189 Chronic kidney disease, unspecified: Secondary | ICD-10-CM | POA: Diagnosis not present

## 2023-03-09 MED ORDER — HEPARIN SOD (PORK) LOCK FLUSH 100 UNIT/ML IV SOLN
500.0000 [IU] | Freq: Once | INTRAVENOUS | Status: AC | PRN
  Filled 2023-03-09: qty 5

## 2023-03-09 MED ORDER — ALTEPLASE 2 MG IJ SOLR
2.0000 mg | Freq: Once | INTRAMUSCULAR | Status: AC | PRN
  Administered 2023-03-09: 2 mg
  Filled 2023-03-09: qty 2

## 2023-03-09 MED ORDER — HEPARIN SOD (PORK) LOCK FLUSH 100 UNIT/ML IV SOLN
INTRAVENOUS | Status: AC
  Administered 2023-03-09: 500 [IU]
  Filled 2023-03-09: qty 5

## 2023-03-09 MED ORDER — SODIUM CHLORIDE 0.9% FLUSH
10.0000 mL | Freq: Once | INTRAVENOUS | Status: AC | PRN
  Administered 2023-03-09: 10 mL
  Filled 2023-03-09: qty 10

## 2023-03-15 ENCOUNTER — Inpatient Hospital Stay: Payer: 59

## 2023-03-15 ENCOUNTER — Ambulatory Visit
Admission: RE | Admit: 2023-03-15 | Discharge: 2023-03-15 | Disposition: A | Discharge status: Home / Self Care | Payer: 59 | Source: Ambulatory Visit | Attending: Oncology | Admitting: Oncology

## 2023-03-15 DIAGNOSIS — C20 Malignant neoplasm of rectum: Secondary | ICD-10-CM | POA: Diagnosis not present

## 2023-03-16 ENCOUNTER — Telehealth: Payer: Self-pay | Admitting: Oncology

## 2023-03-16 NOTE — Telephone Encounter (Signed)
mail reminder sent for CT Scan

## 2023-03-30 ENCOUNTER — Telehealth: Payer: Self-pay | Admitting: Family Medicine

## 2023-03-30 NOTE — Telephone Encounter (Unsigned)
Copied from CRM [id number]. Topic: General - Other >> Mar 30, 2023 10:30 AM [name] wrote: Reason for CRM: Pt is calling in returning a call from the practice. Pt says she doesn't have an answering machine so she is unable to get voicemail.

## 2023-03-30 NOTE — Telephone Encounter (Signed)
Returned call to patient, could not locate reason or see who called her and for why. Reminded her of her next appointment and she had no specific questions or inquiries to express at this time.

## 2023-04-03 ENCOUNTER — Telehealth: Payer: Self-pay | Admitting: Family Medicine

## 2023-04-03 NOTE — Telephone Encounter (Signed)
Contacted April Luna to schedule their annual wellness visit. Appointment made for 05/03/2023.  [facility] Care Guide [facility] AWV TEAM Direct Dial: [phone]

## 2023-04-10 ENCOUNTER — Ambulatory Visit: Payer: Medicare Other | Admitting: Radiation Oncology

## 2023-04-10 ENCOUNTER — Ambulatory Visit: Payer: Self-pay

## 2023-04-10 NOTE — Progress Notes (Signed)
BP 114/62   Pulse (!) 113   Temp 97.7 F (36.5 C)   Resp 18   Ht 4\' 11"  (1.499 m)   Wt 125 lb 4.8 oz (56.8 kg)   SpO2 92%   BMI 25.31 kg/m    Subjective:    Patient ID: April Luna, female    DOB: July 02, 1971, 52 y.o.   MRN: [id number]  HPI: April Luna is a 52 y.o. female  Chief Complaint  Patient presents with   Vaginal Itching   Vaginal discharge/itching: she says she has had issues with yeast in the past.  She says that she started having vaginal discharge and itching since last week. She says it got worse yesterday. Will get vaginal swab, start difulcan and will send in nystatin cream.    Diabetes: she says her blood sugar has been running around 100s. Her last A1C was 6.5 %. She says that she is currently taking actos 15 mg daily, levemir 4 units daily, and farxiga 10 mg daily. Recommend she push fluids.   Relevant past medical, surgical, family and social history reviewed and updated as indicated. Interim medical history since our last visit reviewed. Allergies and medications reviewed and updated.  Review of Systems  Constitutional: Negative for fever or weight change.  Respiratory: Negative for cough and shortness of breath.   Cardiovascular: Negative for chest pain or palpitations.  Gastrointestinal: Negative for abdominal pain, no bowel changes.  Musculoskeletal: Negative for gait problem or joint swelling.  Skin: Negative for rash.  GU: positive for vaginal discharge and itching Neurological: Negative for dizziness or headache.  No other specific complaints in a complete review of systems (except as listed in HPI above).      Objective:    BP 114/62   Pulse (!) 113   Temp 97.7 F (36.5 C)   Resp 18   Ht 4\' 11"  (1.499 m)   Wt 125 lb 4.8 oz (56.8 kg)   SpO2 92%   BMI 25.31 kg/m   Wt Readings from Last 3 Encounters:  04/11/23 125 lb 4.8 oz (56.8 kg)  03/05/23 126 lb 14.4 oz (57.6 kg)  01/08/23 130 lb (59 kg)    Physical  Exam  Constitutional: Patient appears well-developed and well-nourished.  No distress.  HEENT: head atraumatic, normocephalic, pupils equal and reactive to light, neck supple Cardiovascular: Normal rate, regular rhythm and normal heart sounds.  No murmur heard. No BLE edema. Pulmonary/Chest: Effort normal and breath sounds normal. No respiratory distress. Abdominal: Soft.  There is no tenderness. Psychiatric: Patient has a normal mood and affect. behavior is normal. Judgment and thought content normal.  Results for orders placed or performed in visit on 03/05/23  CEA  Result Value Ref Range   CEA 4.7 0.0 - 4.7 ng/mL  Comprehensive metabolic panel  Result Value Ref Range   Sodium 135 135 - 145 mmol/L   Potassium 3.8 3.5 - 5.1 mmol/L   Chloride 101 98 - 111 mmol/L   CO2 25 22 - 32 mmol/L   Glucose, Bld 153 (H) 70 - 99 mg/dL   BUN 14 6 - 20 mg/dL   Creatinine, Ser 9.56 0.44 - 1.00 mg/dL   Calcium 9.1 8.9 - 38.7 mg/dL   Total Protein 7.3 6.5 - 8.1 g/dL   Albumin 4.1 3.5 - 5.0 g/dL   AST 21 15 - 41 U/L   ALT 12 0 - 44 U/L   Alkaline Phosphatase 93 38 - 126 U/L   Total  Bilirubin 0.5 0.3 - 1.2 mg/dL   GFR, Estimated >69 >62 mL/min   Anion gap 9 5 - 15  CBC with Differential/Platelet  Result Value Ref Range   WBC 8.7 4.0 - 10.5 K/uL   RBC 4.54 3.87 - 5.11 MIL/uL   Hemoglobin 15.1 (H) 12.0 - 15.0 g/dL   HCT 95.2 84.1 - 32.4 %   MCV 97.1 80.0 - 100.0 fL   MCH 33.3 26.0 - 34.0 pg   MCHC 34.2 30.0 - 36.0 g/dL   RDW 40.1 02.7 - 25.3 %   Platelets 153 150 - 400 K/uL   nRBC 0.0 0.0 - 0.2 %   Neutrophils Relative % 82 %   Neutro Abs 7.1 1.7 - 7.7 K/uL   Lymphocytes Relative 8 %   Lymphs Abs 0.7 0.7 - 4.0 K/uL   Monocytes Relative 8 %   Monocytes Absolute 0.7 0.1 - 1.0 K/uL   Eosinophils Relative 1 %   Eosinophils Absolute 0.1 0.0 - 0.5 K/uL   Basophils Relative 1 %   Basophils Absolute 0.1 0.0 - 0.1 K/uL   Immature Granulocytes 0 %   Abs Immature Granulocytes 0.03 0.00 - 0.07  K/uL      Assessment & Plan:   Problem List Items Addressed This Visit       Endocrine   Type 2 diabetes mellitus with diabetic microalbuminuria, with long-term current use of insulin    Last A1c 6.5, recommend pushing fluids, since she is on Farxiga      Other Visit Diagnoses     Vaginal discharge    -  Primary   start nystatin cream, diflucan, sent off vaginal swab.  push fluids due to being on farxiga   Relevant Medications   nystatin cream (MYCOSTATIN)   fluconazole (DIFLUCAN) 150 MG tablet   Other Relevant Orders   Cervicovaginal ancillary only   Vaginal itching       start nystatin cream, diflucan, sent off vaginal swab. push fluids due to being on farxiga   Relevant Medications   nystatin cream (MYCOSTATIN)   fluconazole (DIFLUCAN) 150 MG tablet   Other Relevant Orders   Cervicovaginal ancillary only        Follow up plan: Return if symptoms worsen or fail to improve.

## 2023-04-10 NOTE — Telephone Encounter (Signed)
Tried contacting pt to schedule appt per Dr Carlynn Purl. Phone only rang vm id not pick up. Will try again later.

## 2023-04-10 NOTE — Telephone Encounter (Signed)
  Chief Complaint: vaginal itching Symptoms: vaginal itching, white discharge, bleeding from scratching labia Frequency: since last week  Pertinent Negatives: Patient denies odor or pain with urination Disposition: ED /[] Urgent Care (no appt availability in office) / Appointment(In office/virtual)/  [facility]/ Home Care/ Refused Recommended Disposition /[] [facility]/  Follow-up with PCP Additional Notes: pt requesting something to be called in for yeast infection. Pt has no transportation and unable to come in for OV. Pt also doesn't have access to Mychart or a smartphone to do VV. Pt can't use OTC tx or Benadryl for itching d/t chemically off balance per pt. Advised would send message back and have CMA FU with pt. CB # [phone]  Reason for Disposition  MODERATE-SEVERE itching (i.e., interferes with school, work, or sleep)  Answer Assessment - Initial Assessment Questions 1. SYMPTOM: "What's the main symptom you're concerned about?" (e.g., rash, itching, swelling, dryness)     Itching  2. LOCATION: "Where is the  sx located?" (e.g., inside/outside, left/right)     Outside vaginally  3. ONSET: "When did the  sx  start?"     Last week  4. PAIN: "Is there any pain?" If Yes, ask: "How bad is it?" (Scale: 1-10; mild, moderate, severe)   -  MILD (1-3): Doesn't interfere with normal activities.    -  MODERATE (4-7): Interferes with normal activities (e.g., work or school) or awakens from sleep.     -  SEVERE (8-10): Excruciating pain, unable to do any normal activities.     Moderate with itching  5. CAUSE: "What do you think is causing the symptoms?"     Yeast infection  6. OTHER SYMPTOMS: "Do you have any other symptoms?" (e.g., fever, vaginal bleeding, pain with urination)     White discharge and bleeding from scratching  Protocols used: [facility] Symptoms-A-AH

## 2023-04-11 ENCOUNTER — Ambulatory Visit (INDEPENDENT_AMBULATORY_CARE_PROVIDER_SITE_OTHER): Payer: 59 | Admitting: Nurse Practitioner

## 2023-04-11 ENCOUNTER — Encounter: Payer: Self-pay | Admitting: Nurse Practitioner

## 2023-04-11 ENCOUNTER — Other Ambulatory Visit (HOSPITAL_COMMUNITY)
Admission: RE | Admit: 2023-04-11 | Discharge: 2023-04-11 | Disposition: A | Discharge status: Home / Self Care | Payer: 59 | Source: Ambulatory Visit | Attending: Nurse Practitioner | Admitting: Nurse Practitioner

## 2023-04-11 VITALS — BP 114/62 | HR 113 | Temp 97.7°F | Resp 18 | Ht 59.0 in | Wt 125.3 lb

## 2023-04-11 DIAGNOSIS — R809 Proteinuria, unspecified: Secondary | ICD-10-CM | POA: Diagnosis not present

## 2023-04-11 DIAGNOSIS — Z794 Long term (current) use of insulin: Secondary | ICD-10-CM

## 2023-04-11 DIAGNOSIS — N898 Other specified noninflammatory disorders of vagina: Secondary | ICD-10-CM | POA: Insufficient documentation

## 2023-04-11 DIAGNOSIS — E1129 Type 2 diabetes mellitus with other diabetic kidney complication: Secondary | ICD-10-CM

## 2023-04-11 MED ORDER — NYSTATIN 100000 UNIT/GM EX CREA
1.0000 | TOPICAL_CREAM | Freq: Two times a day (BID) | CUTANEOUS | 0 refills | Status: DC
Start: 2023-04-11 — End: 2023-12-11

## 2023-04-11 MED ORDER — FLUCONAZOLE 150 MG PO TABS
150.00 mg | ORAL_TABLET | ORAL | 0 refills | Status: DC | PRN
Start: 2023-04-11 — End: 2023-07-02

## 2023-04-11 NOTE — Assessment & Plan Note (Signed)
Last A1c 6.5, recommend pushing fluids, since she is on [place]

## 2023-04-12 LAB — CERVICOVAGINAL ANCILLARY ONLY
Bacterial Vaginitis (gardnerella): NEGATIVE
Candida Glabrata: NEGATIVE
Candida Vaginitis: POSITIVE — AB
Chlamydia: NEGATIVE
Comment: NEGATIVE
Comment: NEGATIVE
Comment: NEGATIVE
Comment: NEGATIVE
Comment: NEGATIVE
Comment: NORMAL
Neisseria Gonorrhea: NEGATIVE
Trichomonas: NEGATIVE

## 2023-04-16 ENCOUNTER — Inpatient Hospital Stay: Payer: 59

## 2023-04-16 ENCOUNTER — Telehealth: Payer: Self-pay

## 2023-04-16 ENCOUNTER — Ambulatory Visit: Payer: 59 | Admitting: Radiation Oncology

## 2023-04-16 NOTE — Telephone Encounter (Signed)
Per mssg from sabrina: I spoke with pt on Friday evening and she explained to me she is moving and needed to cancel her appts for today. She wont have a phone so she will call the office to reschedule.  Mssg sent to scheduling to cancel port flush appt scheduled for today.

## 2023-04-20 ENCOUNTER — Inpatient Hospital Stay: Payer: 59 | Attending: Oncology

## 2023-04-20 ENCOUNTER — Inpatient Hospital Stay: Payer: 59

## 2023-04-20 DIAGNOSIS — Z452 Encounter for adjustment and management of vascular access device: Secondary | ICD-10-CM | POA: Diagnosis not present

## 2023-04-20 DIAGNOSIS — C20 Malignant neoplasm of rectum: Secondary | ICD-10-CM

## 2023-04-20 MED ORDER — SODIUM CHLORIDE 0.9% FLUSH
10.0000 mL | Freq: Once | INTRAVENOUS | Status: AC | PRN
  Administered 2023-04-20: 10 mL
  Filled 2023-04-20: qty 10

## 2023-04-20 MED ORDER — HEPARIN SOD (PORK) LOCK FLUSH 100 UNIT/ML IV SOLN
500.0000 [IU] | Freq: Once | INTRAVENOUS | Status: AC | PRN
  Administered 2023-04-20: 500 [IU]
  Filled 2023-04-20: qty 5

## 2023-04-27 ENCOUNTER — Other Ambulatory Visit: Payer: Self-pay | Admitting: Family Medicine

## 2023-04-27 DIAGNOSIS — E1129 Type 2 diabetes mellitus with other diabetic kidney complication: Secondary | ICD-10-CM

## 2023-04-27 DIAGNOSIS — E1169 Type 2 diabetes mellitus with other specified complication: Secondary | ICD-10-CM

## 2023-04-30 NOTE — Progress Notes (Deleted)
Name: April Luna   MRN: [id number]    DOB: July 30, 1971   Date:04/30/2023       Progress Note  Subjective  Chief Complaint  Follow Up  HPI  DM: She is taking Levemir about 4 units , Actos and Farxiga, fasting around 90-100 . She denies side effects. She is having polyphagia, polydipsia and polyuria. Last A1C was 7.4 % but today is down to 6.5 %  She has associated dyslipidemia and HTN., CKI stage I and microalbuminuria    Mucopurulent Chronic Bronchitis :  She has chronic cough and sputum that is lighter now, almost white,  she also has wheezing usually at night and SOB with activity. She also had a sleep study that was positive for OSA but now she said unable to tolerate CPAP while at [facility]. She quit smoking but resumed smoking again since Nov 2023   Lice: recurrent: she would like a refill of Malathion    HTN: she is on losartan for her kidney protection also on cardizem since hospital discharge due to tachycardia. She denies chest pain, palpitation or dizziness. BP is at goal today, we will continue current regiment    Hyperlipidemia/Atherosclerosis of aorta : she is now getting medication delivered pre-packaged to her house and has been more compliant, on crestor and vascepa  We will recheck labs    GERD: currently no heartburn or indigestion , she is taking PPI, now seeing Dr. Allegra Lai , she stopped linzess due to colostomy pouch after rectal cancer , she finished chemotherapy but not sure if able to do a re-anastomosis, she states she is okay having the pouch .    Bipolar affective  disorder: she is seeing psychiatrist Dr. Mervyn Skeeters at [facility], she came in alone  Last admission was 2016 for a psychotic episode. No history of suicide attempts. She has been feeling more anxious lately She is also having seasonal affective disorder , she  has follow up with psychiatrist today    Hypothyroidism: she is taking levothyroxine and denies hair loss, no change in bowel movement, she has chronic dry  skin. She states started to take levothyroxine due to lithium therapy years ago We will recheck labs next visit    Chronic right lower back pain: going on for years, she used to see chiropractor but cannot afford it,  seems to be right on top of sacro- iliac joint, she states occasionally shoots down to her right lower leg. Taking gabapentin and prn tylenol and symptoms are stable. She has noticed some right upper back pain and sometimes has neck pain , trying to stretch her neck    Psoriasis: seen by dermatologist in the past, Dr. Orson Aloe, she is now getting otc shampoo and is doing well controlling scalp itching .Unchanged    Rectal cancer: found during screening colonoscopy, diagnosed 01/2022 , she had robotic resection April 2023 , had radiation therapy and last chemotherapy session Dec 2023. She states appetite is still poor   Atherosclerosis of Aorta: taking statins and fish oil, continue medication.Unchanged    Malnutrition: she has lost over 10 % of her weight since Oct 2022, weight was 151 lbs it went down to 130 lbs and today is 130  lbs, weight has stabilized over the past few months . Lack of appetite, she is taking She was taking Glucerna but ran out of supplements    Patient Active Problem List   Diagnosis Date Noted   Rectal bleeding 03/05/2023   Port-A-Cath in place 03/05/2023  UTI (urinary tract infection) 11/07/2022   Chemotherapy-induced neuropathy (HCC) 10/24/2022   Tachycardia 09/22/2022   Acute respiratory failure with hypoxia (HCC) 09/21/2022   COPD with acute exacerbation (HCC) 09/20/2022   Atherosclerosis of aorta (HCC) 07/17/2022   Stage 3a chronic kidney disease (HCC) 07/17/2022   Elevated serum creatinine 06/23/2022   Tobacco use 05/26/2022   Encounter for antineoplastic chemotherapy 05/17/2022   Goals of care, counseling/discussion 05/03/2022   Rectal cancer (HCC) 04/21/2022   Genetic testing 03/27/2022   Family history of colon cancer 03/14/2022   Family  history of uterine cancer 03/14/2022   Family history of breast cancer 03/14/2022   Polyp of ascending colon    Obstructive sleep apnea syndrome 08/11/2021   Plantar callus 10/06/2019   Acquired hallux limitus of both feet 10/06/2019   Type 2 diabetes mellitus with diabetic microalbuminuria, with long-term current use of insulin (HCC) 06/24/2019   Chronic obstructive pulmonary disease (HCC) 06/24/2019   Chronic constipation 06/24/2019   ASCUS of cervix with negative high risk HPV 09/05/2018   GERD without esophagitis 04/11/2018   Hyperlipidemia 04/11/2018   Essential hypertension 04/11/2018   Hypothyroidism 04/26/2015   Bipolar I disorder, most recent episode (or current) manic (HCC) 04/25/2015   Delirium due to another medical condition 04/25/2015   Cannabis abuse 04/25/2015    Past Surgical History:  Procedure Laterality Date   BREAST BIOPSY Left 12/14/2021   [place] bx, venus marker, path pending   CESAREAN SECTION     COLONOSCOPY WITH PROPOFOL N/A 02/09/2022   Procedure: COLONOSCOPY WITH PROPOFOL;  Surgeon: Toney Reil, MD;  Location: [facility];  Service: Endoscopy;  Laterality: N/A;  sleep apnea   CYSTOSCOPY W/ RETROGRADES Left 11/08/2018   Procedure: CYSTOSCOPY WITH RETROGRADE PYELOGRAM;  Surgeon: Sondra Come, MD;  Location: [facility];  Service: Urology;  Laterality: Left;   CYSTOSCOPY/URETEROSCOPY/HOLMIUM LASER/STENT PLACEMENT Left 11/08/2018   Procedure: CYSTOSCOPY/URETEROSCOPY/HOLMIUM LASER/STENT PLACEMENT;  Surgeon: Sondra Come, MD;  Location: [facility];  Service: Urology;  Laterality: Left;   IR IMAGING GUIDED PORT INSERTION  05/11/2022   MOUTH SURGERY     wisdom teeth extraction   MOUTH SURGERY     teeth removal   POLYPECTOMY N/A 02/09/2022   Procedure: POLYPECTOMY;  Surgeon: Toney Reil, MD;  Location: [facility];  Service: Endoscopy;  Laterality: N/A;   XI ROBOTIC ASSISTED LOWER ANTERIOR RESECTION N/A 04/21/2022   Procedure: XI  ROBOTIC ASSISTED LOWER ANTERIOR RESECTION WITH COLOSTOMY, BILATERAL TAP BLOCK, ASSESSMENT OF TISSUE PERFUSSION WITH FIREFLY INJECTION;  Surgeon: Andria Meuse, MD;  Location: [facility];  Service: General;  Laterality: N/A;    Family History  Problem Relation Age of Onset   Depression Mother    Anxiety disorder Mother    Diabetes Mother    Hypertension Mother    Hyperlipidemia Mother    Cancer Mother    Uterine cancer Mother 50   Cervical cancer Mother 39   Colon cancer Father    Depression Brother    Anxiety disorder Brother    Cancer Maternal Aunt        unk types   Diabetes Mellitus II Maternal Grandmother    Hypercholesterolemia Maternal Grandmother    Breast cancer Maternal Grandmother    Cancer Paternal Grandmother    Diabetes Paternal Grandmother    Melanoma Paternal Grandmother    Stomach cancer Paternal Grandmother     Social History   Tobacco Use   Smoking status: Some Days    Packs/day:  0.25    Years: 34.00    Additional pack years: 0.00    Total pack years: 8.50    Types: Cigarettes    Start date: 05/13/1985    Passive exposure: Current   Smokeless tobacco: Never   Tobacco comments:    5-6 cigarettes weekly- 11/07/2022  Substance Use Topics   Alcohol use: Not Currently    Alcohol/week: 4.0 standard drinks of alcohol    Types: 4 Glasses of wine per week    Comment: quit ETOH in Feb. 2023     Current Outpatient Medications:    ACCU-CHEK GUIDE test strip, USE UP TO FOUR TIMES DAILY AS DIRECTED., Disp: 100 each, Rfl: 5   acetaminophen (TYLENOL) 325 MG tablet, Take 650 mg by mouth every 6 (six) hours as needed for headache (pain)., Disp: , Rfl:    albuterol (PROVENTIL) (2.5 MG/3ML) 0.083% nebulizer solution, Take 3 mLs (2.5 mg total) by nebulization every 6 (six) hours as needed for shortness of breath or wheezing., Disp: 75 mL, Rfl: 0   albuterol (VENTOLIN HFA) 108 (90 Base) MCG/ACT inhaler, Inhale 2 puffs into the lungs every 6 (six) hours as needed for  wheezing or shortness of breath., Disp: 1 each, Rfl: 5   amantadine (SYMMETREL) 100 MG capsule, Take 100 mg by mouth 2 (two) times daily., Disp: , Rfl:    blood glucose meter kit and supplies, Dispense based on patient and insurance preference. Use up to four times daily as directed. (FOR ICD-10 E10.9, E11.9)., Disp: 1 each, Rfl: 0   Budeson-Glycopyrrol-Formoterol (BREZTRI AEROSPHERE) 160-9-4.8 MCG/ACT AERO, Inhale 2 puffs into the lungs in the morning and at bedtime., Disp: 5.9 g, Rfl: 11   buPROPion (WELLBUTRIN XL) 300 MG 24 hr tablet, Take 300 mg by mouth daily., Disp: , Rfl:    Cholecalciferol (VITAMIN D-3) 125 MCG (5000 UT) TABS, Take 5,000 Units by mouth daily., Disp: 30 tablet, Rfl: 1   dapagliflozin propanediol (FARXIGA) 10 MG TABS tablet, Take 1 tablet (10 mg total) by mouth daily before breakfast., Disp: 30 tablet, Rfl: 3   diltiazem (CARDIZEM CD) 180 MG 24 hr capsule, Take 1 capsule (180 mg total) by mouth daily., Disp: 30 capsule, Rfl: 3   docusate sodium (COLACE) 100 MG capsule, TAKE 1 CAPSULE BY MOUTH TWICE DAILY, Disp: 60 capsule, Rfl: 1   fluconazole (DIFLUCAN) 150 MG tablet, Take 1 tablet (150 mg total) by mouth every 3 (three) days as needed (for vaginal itching/yeast infection sx)., Disp: 2 tablet, Rfl: 0   gabapentin (NEURONTIN) 300 MG capsule, Take 300 mg by mouth 2 (two) times daily., Disp: , Rfl:    hydrocortisone cream 0.5 %, Apply 1 Application topically 2 (two) times daily as needed for itching., Disp: 30 g, Rfl: 1   Hydrocortisone, Perianal, 1 % CREA, Apply topically., Disp: , Rfl:    hydrOXYzine (VISTARIL) 50 MG capsule, Take 50 mg by mouth 3 (three) times daily as needed for anxiety., Disp: , Rfl:    icosapent Ethyl (VASCEPA) 1 g capsule, TAKE 2 CAPSULES BY MOUTH 2 TIMES DAILY, Disp: 120 capsule, Rfl: 3   insulin detemir (LEVEMIR FLEXPEN) 100 UNIT/ML FlexPen, Inject 4-10 Units into the skin daily., Disp: 3 mL, Rfl: 3   Insulin Pen Needle 32G X 4 MM MISC, 1 each by Does  not apply route daily at 12 noon., Disp: 100 each, Rfl: 0   ipratropium-albuterol (DUONEB) 0.5-2.5 (3) MG/3ML SOLN, Take 3 mLs by nebulization every 6 (six) hours as needed., Disp: 360 mL,  Rfl: 11   levothyroxine (SYNTHROID) 75 MCG tablet, TAKE 1 TABLET BY MOUTH DAILY BEFORE BREAKFAST, Disp: 90 tablet, Rfl: 2   loratadine (CLARITIN) 10 MG tablet, Take 10 mg by mouth every morning., Disp: , Rfl:    losartan (COZAAR) 25 MG tablet, Take 1 tablet (25 mg total) by mouth at bedtime., Disp: 30 tablet, Rfl: 3   nicotine (NICODERM CQ - DOSED IN MG/24 HOURS) 21 mg/24hr patch, Aplly 21mg  patch chest wall daily (okay to substitute generic) (Patient not taking: Reported on 03/05/2023), Disp: 28 patch, Rfl: 0   NON FORMULARY, Pt uses a cpap nightly, Disp: , Rfl:    nystatin cream (MYCOSTATIN), Apply 1 Application topically 2 (two) times daily., Disp: 30 g, Rfl: 0   oxybutynin (DITROPAN-XL) 10 MG 24 hr tablet, Take 1 tablet (10 mg total) by mouth daily., Disp: 30 tablet, Rfl: 11   paliperidone (INVEGA SUSTENNA) 234 MG/1.5ML SUSY injection, Inject 234 mg into the muscle every 30 (thirty) days. On or about the 14th of each month, Disp: , Rfl:    pantoprazole (PROTONIX) 40 MG tablet, TAKE 1 TABLET BY MOUTH DAILY, Disp: 90 tablet, Rfl: 2   Pediatric Multivit-Minerals-C (CHEWABLES MULTIVITAMIN PO), Take 1 tablet by mouth daily., Disp: , Rfl:    pioglitazone (ACTOS) 15 MG tablet, Take 1 tablet (15 mg total) by mouth daily., Disp: 30 tablet, Rfl: 3   rosuvastatin (CRESTOR) 40 MG tablet, Take 1 tablet (40 mg total) by mouth every morning., Disp: 30 tablet, Rfl: 3   sertraline (ZOLOFT) 100 MG tablet, Take 200 mg by mouth every morning., Disp: , Rfl:    Spacer/Aero-Holding Chambers (AEROCHAMBER MV) inhaler, Use as instructed, Disp: 1 each, Rfl: 0  Allergies  Allergen Reactions   Metformin And Related Nausea And Vomiting   Nsaids Other (See Comments)    Stage 3 CKD   Perphenazine Other (See Comments)    Tremors, muscle  weakness, tongue swelling   Sulfa Antibiotics Other (See Comments)    GI distress    Abilify [Aripiprazole] Rash   Penicillins Rash    She has taken amoxicillin without problems    I personally reviewed active problem list, medication list, allergies, family history, social history, health maintenance with the patient/caregiver today.   ROS  ***  Objective  There were no vitals filed for this visit.  There is no height or weight on file to calculate BMI.  Physical Exam ***   PHQ2/9:    04/11/2023   11:32 AM 12/26/2022    2:07 PM 11/10/2022    2:52 PM 11/03/2022   10:00 AM 10/05/2022   10:12 AM  Depression screen PHQ 2/9  Decreased Interest 0 1 1 0 1  Down, Depressed, Hopeless 0 1 1 1 1   PHQ - 2 Score 0 2 2 1 2   Altered sleeping 0 0 1 0 1  Tired, decreased energy 0 3 1 0 1  Change in appetite 0 3 1 0 1  Feeling bad or failure about yourself  0 0 0 0 0  Trouble concentrating 0 0 1 0 1  Moving slowly or fidgety/restless 0 0 0 0 0  Suicidal thoughts 0 0 0 0 0  PHQ-9 Score 0 8 6 1 6   Difficult doing work/chores Not difficult at all  Somewhat difficult  Somewhat difficult    phq 9 is {gen pos [id number]}   Fall Risk:    04/11/2023   11:32 AM 12/26/2022    2:06 PM 11/10/2022  2:52 PM 11/03/2022    9:52 AM 10/05/2022   10:11 AM  Fall Risk   Falls in the past year? 0 0 0 0 0  Number falls in past yr: 0 0  0   Injury with Fall? 0 0  0   Risk for fall due to :  No Fall Risks No Fall Risks No Fall Risks No Fall Risks  Follow up  Falls prevention discussed Falls prevention discussed;Education provided;Falls evaluation completed Falls prevention discussed Falls prevention discussed;Education provided;Falls evaluation completed      Functional Status Survey:      Assessment & Plan  *** There are no diagnoses linked to this encounter.

## 2023-05-01 ENCOUNTER — Ambulatory Visit: Payer: 59 | Admitting: Family Medicine

## 2023-05-01 DIAGNOSIS — E1129 Type 2 diabetes mellitus with other diabetic kidney complication: Secondary | ICD-10-CM

## 2023-05-10 ENCOUNTER — Other Ambulatory Visit: Payer: Self-pay | Admitting: Family Medicine

## 2023-05-10 DIAGNOSIS — E1129 Type 2 diabetes mellitus with other diabetic kidney complication: Secondary | ICD-10-CM

## 2023-05-10 DIAGNOSIS — E1169 Type 2 diabetes mellitus with other specified complication: Secondary | ICD-10-CM

## 2023-05-11 ENCOUNTER — Encounter: Payer: Self-pay | Admitting: Nurse Practitioner

## 2023-05-11 ENCOUNTER — Other Ambulatory Visit: Payer: Self-pay

## 2023-05-11 DIAGNOSIS — E1169 Type 2 diabetes mellitus with other specified complication: Secondary | ICD-10-CM

## 2023-05-11 DIAGNOSIS — Z933 Colostomy status: Secondary | ICD-10-CM | POA: Diagnosis not present

## 2023-05-11 DIAGNOSIS — E1129 Type 2 diabetes mellitus with other diabetic kidney complication: Secondary | ICD-10-CM

## 2023-05-11 MED ORDER — PIOGLITAZONE HCL 15 MG PO TABS: 15.0000 mg | ORAL_TABLET | Freq: Every day | ORAL | 0 refills | Status: DC

## 2023-05-11 MED ORDER — DAPAGLIFLOZIN PROPANEDIOL 10 MG PO TABS: 10.0000 mg | ORAL_TABLET | Freq: Every day | ORAL | 0 refills | Status: DC

## 2023-05-11 NOTE — Telephone Encounter (Signed)
Appt sch'd for June 10th and she will need a refill before her appt

## 2023-05-25 ENCOUNTER — Other Ambulatory Visit: Payer: Self-pay | Admitting: Family Medicine

## 2023-05-25 ENCOUNTER — Other Ambulatory Visit: Payer: Self-pay | Admitting: Oncology

## 2023-05-25 DIAGNOSIS — E785 Hyperlipidemia, unspecified: Secondary | ICD-10-CM

## 2023-05-25 DIAGNOSIS — I10 Essential (primary) hypertension: Secondary | ICD-10-CM

## 2023-05-25 NOTE — Telephone Encounter (Signed)
Requested medication (s) are due for refill today: yes  Requested medication (s) are on the active medication list: yes  Last refill:  12/26/22 #30/3  Future visit scheduled: yes  Notes to clinic:  Unable to refill per protocol due to failed labs, no updated results.     Requested Prescriptions  Pending Prescriptions Disp Refills   rosuvastatin (CRESTOR) 40 MG tablet [Pharmacy Med Name: ROSUVASTATIN CALCIUM 40 MG TAB] 30 tablet 3    Sig: TAKE 1 TABLET BY MOUTH EVERY MORNING     Cardiovascular:  Antilipid - Statins 2 Failed - 05/25/2023  5:59 PM      Failed - Lipid Panel in normal range within the last 12 months    Cholesterol  Date Value Ref Range Status  03/15/2022 139 <200 mg/dL Final  16/09/9603 540 0 - 200 mg/dL Final   Ldl Cholesterol, Calc  Date Value Ref Range Status  01/08/2015 96 0 - 100 mg/dL Final   LDL Cholesterol (Calc)  Date Value Ref Range Status  03/15/2022 71 mg/dL (calc) Final    Comment:    Reference range: <100 . Desirable range <100 mg/dL for primary prevention;   <70 mg/dL for patients with CHD or diabetic patients  with > or = 2 CHD risk factors. Marland Kitchen LDL-C is now calculated using the Martin-Hopkins  calculation, which is a validated novel method providing  better accuracy than the Friedewald equation in the  estimation of LDL-C.  Horald Pollen et al. Lenox Ahr. 9811;914(78): 2061-2068  (http://education.[facility].com/faq/FAQ164)    HDL Cholesterol  Date Value Ref Range Status  01/08/2015 58 40 - 60 mg/dL Final   HDL  Date Value Ref Range Status  03/15/2022 43 (L) > OR = 50 mg/dL Final   Triglycerides  Date Value Ref Range Status  03/15/2022 167 (H) <150 mg/dL Final  29/56/2130 865 (H) 0 - 200 mg/dL Final         Passed - Cr in normal range and within 360 days    Creat  Date Value Ref Range Status  10/05/2022 1.08 (H) 0.50 - 0.99 mg/dL Final   Creatinine, Ser  Date Value Ref Range Status  03/05/2023 0.94 0.44 - 1.00 mg/dL Final    Creatinine, Urine  Date Value Ref Range Status  07/17/2022 73 20 - 275 mg/dL Final         Passed - Patient is not pregnant      Passed - Valid encounter within last 12 months    Recent Outpatient Visits           1 month ago Vaginal discharge   [facility] Della Goo F, FNP   5 months ago Type 2 diabetes mellitus with microalbuminuria, without long-term current use of insulin [facility])   [facility] Alba Cory, MD   6 months ago Type 2 diabetes mellitus with microalbuminuria, without long-term current use of insulin [facility])    [facility] Alba Cory, MD   6 months ago Lower abdominal pain   [facility] Alba Cory, MD   7 months ago Hospital discharge follow-up   [facility] Alba Cory, MD       Future Appointments             In 1 week Alba Cory, MD [facility], [facility]   In 1 month Richardo Hanks, Laurette Schimke, MD [facility] [place]   In 3 months  [facility]  [facility]

## 2023-05-28 ENCOUNTER — Encounter: Payer: Self-pay | Admitting: Nurse Practitioner

## 2023-05-28 ENCOUNTER — Other Ambulatory Visit: Payer: Medicare Other

## 2023-05-28 ENCOUNTER — Other Ambulatory Visit: Payer: Self-pay

## 2023-05-28 ENCOUNTER — Ambulatory Visit: Payer: 59

## 2023-05-28 DIAGNOSIS — R0602 Shortness of breath: Secondary | ICD-10-CM

## 2023-05-28 DIAGNOSIS — F17210 Nicotine dependence, cigarettes, uncomplicated: Secondary | ICD-10-CM

## 2023-05-31 ENCOUNTER — Ambulatory Visit: Admission: RE | Admit: 2023-05-31 | Payer: 59 | Source: Ambulatory Visit

## 2023-05-31 ENCOUNTER — Ambulatory Visit
Admission: RE | Admit: 2023-05-31 | Discharge: 2023-05-31 | Disposition: A | Discharge status: Home / Self Care | Payer: 59 | Source: Ambulatory Visit | Attending: Oncology | Admitting: Oncology

## 2023-05-31 ENCOUNTER — Inpatient Hospital Stay: Payer: 59 | Attending: Oncology

## 2023-05-31 DIAGNOSIS — C20 Malignant neoplasm of rectum: Secondary | ICD-10-CM | POA: Insufficient documentation

## 2023-05-31 DIAGNOSIS — R911 Solitary pulmonary nodule: Secondary | ICD-10-CM | POA: Diagnosis not present

## 2023-05-31 DIAGNOSIS — N200 Calculus of kidney: Secondary | ICD-10-CM | POA: Diagnosis not present

## 2023-05-31 DIAGNOSIS — N281 Cyst of kidney, acquired: Secondary | ICD-10-CM | POA: Diagnosis not present

## 2023-05-31 DIAGNOSIS — Z79899 Other long term (current) drug therapy: Secondary | ICD-10-CM | POA: Insufficient documentation

## 2023-05-31 MED ORDER — IOHEXOL 300 MG/ML  SOLN
100.0000 mL | Freq: Once | INTRAMUSCULAR | Status: AC | PRN
  Administered 2023-05-31: 100 mL via INTRAVENOUS

## 2023-05-31 MED ORDER — BARIUM SULFATE 2 % PO SUSP
450.0000 mL | Freq: Once | ORAL | Status: AC
  Administered 2023-05-31 (×2): 450 mL via ORAL

## 2023-06-01 ENCOUNTER — Telehealth: Payer: Self-pay | Admitting: *Deleted

## 2023-06-01 ENCOUNTER — Other Ambulatory Visit: Payer: 59

## 2023-06-01 NOTE — Telephone Encounter (Signed)
Called report    IMPRESSION: 1. New hypodense 16 mm lesion in the right lobe of the liver with very subtle adjacent 5 mm lesion, suspicious for metastatic disease. 2. Small bilateral pulmonary nodules, some of which were subtly evident on prior examination only in retrospect, some of which demonstrate degree of cavitation, suspicious for metastatic disease. 3. Prominent retroperitoneal lymph nodes are similar prior. 4. Prior low anterior resection with Hartmann's pouch formation, similar small amount of perirectal/presacral soft tissue and fluid, favored postsurgical/posttreatment change. Suggest continued attention on follow-up imaging. 5. Large volume of formed stool in the colon. 6. Nonobstructive left renal calculi measure under 1 cm.   These results will be called to the ordering clinician or representative by the Radiologist Assistant, and communication documented in the PACS or [employer].     Electronically Signed   By: Maudry Mayhew M.D.   On: 05/31/2023 16:52

## 2023-06-01 NOTE — Progress Notes (Unsigned)
Name: April Luna   MRN: [id number]    DOB: 1971/06/11   Date:06/01/2023       Progress Note  Subjective  Chief Complaint  Medication Refill  HPI  DM: She is taking Levemir about 4 units , Actos and Farxiga, fasting around 90-100 . She denies side effects. She is having polyphagia, polydipsia and polyuria. Last A1C was 7.4 % but today is down to 6.5 %  She has associated dyslipidemia and HTN., CKI stage I and microalbuminuria    Mucopurulent Chronic Bronchitis :  She has chronic cough and sputum that is lighter now, almost white,  she also has wheezing usually at night and SOB with activity. She also had a sleep study that was positive for OSA but now she said unable to tolerate CPAP while at [facility] She quit smoking but resumed smoking again since Nov 2023   Lice: recurrent: she would like a refill of Malathion    HTN: she is on losartan for her kidney protection also on cardizem since hospital discharge due to tachycardia. She denies chest pain, palpitation or dizziness. BP is at goal today, we will continue current regiment    Hyperlipidemia/Atherosclerosis of aorta : she is now getting medication delivered pre-packaged to her house and has been more compliant, on crestor and vascepa  We will recheck labs    GERD: currently no heartburn or indigestion , she is taking PPI, now seeing Dr. Allegra Lai , she stopped linzess due to colostomy pouch after rectal cancer , she finished chemotherapy but not sure if able to do a re-anastomosis, she states she is okay having the pouch .    Bipolar affective  disorder: she is seeing psychiatrist Dr. Mervyn Skeeters at [facility], she came in alone  Last admission was 2016 for a psychotic episode. No history of suicide attempts. She has been feeling more anxious lately She is also having seasonal affective disorder , she  has follow up with psychiatrist today    Hypothyroidism: she is taking levothyroxine and denies hair loss, no change in bowel movement, she has  chronic dry skin. She states started to take levothyroxine due to lithium therapy years ago We will recheck labs next visit    Chronic right lower back pain: going on for years, she used to see chiropractor but cannot afford it,  seems to be right on top of sacro- iliac joint, she states occasionally shoots down to her right lower leg. Taking gabapentin and prn tylenol and symptoms are stable. She has noticed some right upper back pain and sometimes has neck pain , trying to stretch her neck    Psoriasis: seen by dermatologist in the past, Dr. Orson Aloe, she is now getting otc shampoo and is doing well controlling scalp itching .Unchanged    Rectal cancer: found during screening colonoscopy, diagnosed 01/2022 , she had robotic resection April 2023 , had radiation therapy and last chemotherapy session Dec 2023. She states appetite is still poor   Atherosclerosis of Aorta: taking statins and fish oil, continue medication.Unchanged    Malnutrition: she has lost over 10 % of her weight since Oct 2022, weight was 151 lbs it went down to 130 lbs and today is 130  lbs, weight has stabilized over the past few months . Lack of appetite, she is taking She was taking Glucerna but ran out of supplements    Patient Active Problem List   Diagnosis Date Noted   Rectal bleeding 03/05/2023   Port-A-Cath in place 03/05/2023  UTI (urinary tract infection) 11/07/2022   Chemotherapy-induced neuropathy (HCC) 10/24/2022   Tachycardia 09/22/2022   Acute respiratory failure with hypoxia (HCC) 09/21/2022   COPD with acute exacerbation (HCC) 09/20/2022   Atherosclerosis of aorta (HCC) 07/17/2022   Stage 3a chronic kidney disease (HCC) 07/17/2022   Elevated serum creatinine 06/23/2022   Tobacco use 05/26/2022   Encounter for antineoplastic chemotherapy 05/17/2022   Goals of care, counseling/discussion 05/03/2022   Rectal cancer (HCC) 04/21/2022   Genetic testing 03/27/2022   Family history of colon cancer  03/14/2022   Family history of uterine cancer 03/14/2022   Family history of breast cancer 03/14/2022   Polyp of ascending colon    Obstructive sleep apnea syndrome 08/11/2021   Plantar callus 10/06/2019   Acquired hallux limitus of both feet 10/06/2019   Type 2 diabetes mellitus with diabetic microalbuminuria, with long-term current use of insulin (HCC) 06/24/2019   Chronic obstructive pulmonary disease (HCC) 06/24/2019   Chronic constipation 06/24/2019   ASCUS of cervix with negative high risk HPV 09/05/2018   GERD without esophagitis 04/11/2018   Hyperlipidemia 04/11/2018   Essential hypertension 04/11/2018   Hypothyroidism 04/26/2015   Bipolar I disorder, most recent episode (or current) manic (HCC) 04/25/2015   Delirium due to another medical condition 04/25/2015   Cannabis abuse 04/25/2015    Past Surgical History:  Procedure Laterality Date   BREAST BIOPSY Left 12/14/2021   [place] bx, venus marker, path pending   CESAREAN SECTION     COLONOSCOPY WITH PROPOFOL N/A 02/09/2022   Procedure: COLONOSCOPY WITH PROPOFOL;  Surgeon: Toney Reil, MD;  Location: [facility];  Service: Endoscopy;  Laterality: N/A;  sleep apnea   CYSTOSCOPY W/ RETROGRADES Left 11/08/2018   Procedure: CYSTOSCOPY WITH RETROGRADE PYELOGRAM;  Surgeon: Sondra Come, MD;  Location: [facility];  Service: Urology;  Laterality: Left;   CYSTOSCOPY/URETEROSCOPY/HOLMIUM LASER/STENT PLACEMENT Left 11/08/2018   Procedure: CYSTOSCOPY/URETEROSCOPY/HOLMIUM LASER/STENT PLACEMENT;  Surgeon: Sondra Come, MD;  Location: [facility];  Service: Urology;  Laterality: Left;   IR IMAGING GUIDED PORT INSERTION  05/11/2022   MOUTH SURGERY     wisdom teeth extraction   MOUTH SURGERY     teeth removal   POLYPECTOMY N/A 02/09/2022   Procedure: POLYPECTOMY;  Surgeon: Toney Reil, MD;  Location: [facility];  Service: Endoscopy;  Laterality: N/A;   XI ROBOTIC ASSISTED LOWER ANTERIOR RESECTION N/A  04/21/2022   Procedure: XI ROBOTIC ASSISTED LOWER ANTERIOR RESECTION WITH COLOSTOMY, BILATERAL TAP BLOCK, ASSESSMENT OF TISSUE PERFUSSION WITH FIREFLY INJECTION;  Surgeon: Andria Meuse, MD;  Location: [facility];  Service: General;  Laterality: N/A;    Family History  Problem Relation Age of Onset   Depression Mother    Anxiety disorder Mother    Diabetes Mother    Hypertension Mother    Hyperlipidemia Mother    Cancer Mother    Uterine cancer Mother 13   Cervical cancer Mother 92   Colon cancer Father    Depression Brother    Anxiety disorder Brother    Cancer Maternal Aunt        unk types   Diabetes Mellitus II Maternal Grandmother    Hypercholesterolemia Maternal Grandmother    Breast cancer Maternal Grandmother    Cancer Paternal Grandmother    Diabetes Paternal Grandmother    Melanoma Paternal Grandmother    Stomach cancer Paternal Grandmother     Social History   Tobacco Use   Smoking status: Some Days    Packs/day:  0.25    Years: 34.00    Additional pack years: 0.00    Total pack years: 8.50    Types: Cigarettes    Start date: 05/13/1985    Passive exposure: Current   Smokeless tobacco: Never   Tobacco comments:    5-6 cigarettes weekly- 11/07/2022  Substance Use Topics   Alcohol use: Not Currently    Alcohol/week: 4.0 standard drinks of alcohol    Types: 4 Glasses of wine per week    Comment: quit ETOH in Feb. 2023     Current Outpatient Medications:    ACCU-CHEK GUIDE test strip, USE UP TO FOUR TIMES DAILY AS DIRECTED., Disp: 100 each, Rfl: 5   acetaminophen (TYLENOL) 325 MG tablet, Take 650 mg by mouth every 6 (six) hours as needed for headache (pain)., Disp: , Rfl:    albuterol (PROVENTIL) (2.5 MG/3ML) 0.083% nebulizer solution, Take 3 mLs (2.5 mg total) by nebulization every 6 (six) hours as needed for shortness of breath or wheezing., Disp: 75 mL, Rfl: 0   albuterol (VENTOLIN HFA) 108 (90 Base) MCG/ACT inhaler, Inhale 2 puffs into the lungs every  6 (six) hours as needed for wheezing or shortness of breath., Disp: 1 each, Rfl: 5   amantadine (SYMMETREL) 100 MG capsule, Take 100 mg by mouth 2 (two) times daily., Disp: , Rfl:    blood glucose meter kit and supplies, Dispense based on patient and insurance preference. Use up to four times daily as directed. (FOR ICD-10 E10.9, E11.9)., Disp: 1 each, Rfl: 0   Budeson-Glycopyrrol-Formoterol (BREZTRI AEROSPHERE) 160-9-4.8 MCG/ACT AERO, Inhale 2 puffs into the lungs in the morning and at bedtime., Disp: 5.9 g, Rfl: 11   buPROPion (WELLBUTRIN XL) 300 MG 24 hr tablet, Take 300 mg by mouth daily., Disp: , Rfl:    Cholecalciferol (VITAMIN D-3) 125 MCG (5000 UT) TABS, Take 5,000 Units by mouth daily., Disp: 30 tablet, Rfl: 1   dapagliflozin propanediol (FARXIGA) 10 MG TABS tablet, Take 1 tablet (10 mg total) by mouth daily before breakfast., Disp: 30 tablet, Rfl: 0   diltiazem (CARDIZEM CD) 180 MG 24 hr capsule, TAKE 1 CAPSULE BY MOUTH DAILY, Disp: 30 capsule, Rfl: 0   docusate sodium (COLACE) 100 MG capsule, TAKE 1 CAPSULE BY MOUTH TWICE DAILY, Disp: 60 capsule, Rfl: 1   fluconazole (DIFLUCAN) 150 MG tablet, Take 1 tablet (150 mg total) by mouth every 3 (three) days as needed (for vaginal itching/yeast infection sx)., Disp: 2 tablet, Rfl: 0   gabapentin (NEURONTIN) 300 MG capsule, Take 300 mg by mouth 2 (two) times daily., Disp: , Rfl:    hydrocortisone cream 0.5 %, Apply 1 Application topically 2 (two) times daily as needed for itching., Disp: 30 g, Rfl: 1   Hydrocortisone, Perianal, 1 % CREA, Apply topically., Disp: , Rfl:    hydrOXYzine (VISTARIL) 50 MG capsule, Take 50 mg by mouth 3 (three) times daily as needed for anxiety., Disp: , Rfl:    icosapent Ethyl (VASCEPA) 1 g capsule, TAKE 2 CAPSULES BY MOUTH 2 TIMES DAILY, Disp: 120 capsule, Rfl: 3   insulin detemir (LEVEMIR FLEXPEN) 100 UNIT/ML FlexPen, Inject 4-10 Units into the skin daily., Disp: 3 mL, Rfl: 3   Insulin Pen Needle 32G X 4 MM MISC, 1  each by Does not apply route daily at 12 noon., Disp: 100 each, Rfl: 0   ipratropium-albuterol (DUONEB) 0.5-2.5 (3) MG/3ML SOLN, Take 3 mLs by nebulization every 6 (six) hours as needed., Disp: 360 mL, Rfl: 11  levothyroxine (SYNTHROID) 75 MCG tablet, TAKE 1 TABLET BY MOUTH DAILY BEFORE BREAKFAST, Disp: 90 tablet, Rfl: 2   loratadine (CLARITIN) 10 MG tablet, Take 10 mg by mouth every morning., Disp: , Rfl:    losartan (COZAAR) 25 MG tablet, Take 1 tablet (25 mg total) by mouth at bedtime., Disp: 30 tablet, Rfl: 3   nicotine (NICODERM CQ - DOSED IN MG/24 HOURS) 21 mg/24hr patch, Aplly 21mg  patch chest wall daily (okay to substitute generic) (Patient not taking: Reported on 03/05/2023), Disp: 28 patch, Rfl: 0   NON FORMULARY, Pt uses a cpap nightly, Disp: , Rfl:    nystatin cream (MYCOSTATIN), Apply 1 Application topically 2 (two) times daily., Disp: 30 g, Rfl: 0   oxybutynin (DITROPAN-XL) 10 MG 24 hr tablet, Take 1 tablet (10 mg total) by mouth daily., Disp: 30 tablet, Rfl: 11   paliperidone (INVEGA SUSTENNA) 234 MG/1.5ML SUSY injection, Inject 234 mg into the muscle every 30 (thirty) days. On or about the 14th of each month, Disp: , Rfl:    pantoprazole (PROTONIX) 40 MG tablet, TAKE 1 TABLET BY MOUTH DAILY, Disp: 90 tablet, Rfl: 2   Pediatric Multivit-Minerals-C (CHEWABLES MULTIVITAMIN PO), Take 1 tablet by mouth daily., Disp: , Rfl:    pioglitazone (ACTOS) 15 MG tablet, Take 1 tablet (15 mg total) by mouth daily., Disp: 30 tablet, Rfl: 0   rosuvastatin (CRESTOR) 40 MG tablet, TAKE 1 TABLET BY MOUTH EVERY MORNING, Disp: 30 tablet, Rfl: 3   sertraline (ZOLOFT) 100 MG tablet, Take 200 mg by mouth every morning., Disp: , Rfl:    Spacer/Aero-Holding Chambers (AEROCHAMBER MV) inhaler, Use as instructed, Disp: 1 each, Rfl: 0  Allergies  Allergen Reactions   Metformin And Related Nausea And Vomiting   Nsaids Other (See Comments)    Stage 3 CKD   Perphenazine Other (See Comments)    Tremors, muscle  weakness, tongue swelling   Sulfa Antibiotics Other (See Comments)    GI distress    Abilify [Aripiprazole] Rash   Penicillins Rash    She has taken amoxicillin without problems    I personally reviewed active problem list, medication list, allergies, family history, social history, health maintenance with the patient/caregiver today.   ROS  ***  Objective  There were no vitals filed for this visit.  There is no height or weight on file to calculate BMI.  Physical Exam ***   PHQ2/9:    04/11/2023   11:32 AM 12/26/2022    2:07 PM 11/10/2022    2:52 PM 11/03/2022   10:00 AM 10/05/2022   10:12 AM  Depression screen PHQ 2/9  Decreased Interest 0 1 1 0 1  Down, Depressed, Hopeless 0 1 1 1 1   PHQ - 2 Score 0 2 2 1 2   Altered sleeping 0 0 1 0 1  Tired, decreased energy 0 3 1 0 1  Change in appetite 0 3 1 0 1  Feeling bad or failure about yourself  0 0 0 0 0  Trouble concentrating 0 0 1 0 1  Moving slowly or fidgety/restless 0 0 0 0 0  Suicidal thoughts 0 0 0 0 0  PHQ-9 Score 0 8 6 1 6   Difficult doing work/chores Not difficult at all  Somewhat difficult  Somewhat difficult    phq 9 is {gen pos [id number]}   Fall Risk:    04/11/2023   11:32 AM 12/26/2022    2:06 PM 11/10/2022    2:52 PM 11/03/2022    9:52  AM 10/05/2022   10:11 AM  Fall Risk   Falls in the past year? 0 0 0 0 0  Number falls in past yr: 0 0  0   Injury with Fall? 0 0  0   Risk for fall due to :  No Fall Risks No Fall Risks No Fall Risks No Fall Risks  Follow up  Falls prevention discussed Falls prevention discussed;Education provided;Falls evaluation completed Falls prevention discussed Falls prevention discussed;Education provided;Falls evaluation completed      Functional Status Survey:      Assessment & Plan  *** There are no diagnoses linked to this encounter.

## 2023-06-04 ENCOUNTER — Encounter: Payer: Self-pay | Admitting: Family Medicine

## 2023-06-04 ENCOUNTER — Ambulatory Visit (INDEPENDENT_AMBULATORY_CARE_PROVIDER_SITE_OTHER): Payer: 59 | Admitting: Family Medicine

## 2023-06-04 VITALS — BP 102/62 | HR 75 | Temp 98.3°F | Resp 16 | Ht 59.0 in | Wt 137.1 lb

## 2023-06-04 DIAGNOSIS — R809 Proteinuria, unspecified: Secondary | ICD-10-CM | POA: Diagnosis not present

## 2023-06-04 DIAGNOSIS — E785 Hyperlipidemia, unspecified: Secondary | ICD-10-CM | POA: Diagnosis not present

## 2023-06-04 DIAGNOSIS — E1129 Type 2 diabetes mellitus with other diabetic kidney complication: Secondary | ICD-10-CM | POA: Diagnosis not present

## 2023-06-04 DIAGNOSIS — I10 Essential (primary) hypertension: Secondary | ICD-10-CM

## 2023-06-04 DIAGNOSIS — N1831 Chronic kidney disease, stage 3a: Secondary | ICD-10-CM

## 2023-06-04 DIAGNOSIS — J411 Mucopurulent chronic bronchitis: Secondary | ICD-10-CM

## 2023-06-04 DIAGNOSIS — E032 Hypothyroidism due to medicaments and other exogenous substances: Secondary | ICD-10-CM

## 2023-06-04 DIAGNOSIS — E1169 Type 2 diabetes mellitus with other specified complication: Secondary | ICD-10-CM | POA: Diagnosis not present

## 2023-06-04 DIAGNOSIS — R000 Tachycardia, unspecified: Secondary | ICD-10-CM | POA: Diagnosis not present

## 2023-06-04 DIAGNOSIS — Z794 Long term (current) use of insulin: Secondary | ICD-10-CM | POA: Diagnosis not present

## 2023-06-04 DIAGNOSIS — I700 Atherosclerosis of aorta: Secondary | ICD-10-CM | POA: Diagnosis not present

## 2023-06-04 DIAGNOSIS — E538 Deficiency of other specified B group vitamins: Secondary | ICD-10-CM

## 2023-06-04 DIAGNOSIS — F3110 Bipolar disorder, current episode manic without psychotic features, unspecified: Secondary | ICD-10-CM

## 2023-06-04 LAB — POCT GLYCOSYLATED HEMOGLOBIN (HGB A1C): Hemoglobin A1C: 5.9 % — AB (ref 4.0–5.6)

## 2023-06-04 MED ORDER — DILTIAZEM HCL ER COATED BEADS 120 MG PO CP24
120.0000 mg | ORAL_CAPSULE | Freq: Every day | ORAL | 1 refills | Status: DC
Start: 2023-06-04 — End: 2023-11-08

## 2023-06-04 MED ORDER — PIOGLITAZONE HCL 15 MG PO TABS: 15.0000 mg | ORAL_TABLET | Freq: Every day | ORAL | 1 refills | Status: DC

## 2023-06-05 ENCOUNTER — Encounter: Payer: Self-pay | Admitting: Oncology

## 2023-06-05 ENCOUNTER — Inpatient Hospital Stay: Payer: 59

## 2023-06-05 ENCOUNTER — Inpatient Hospital Stay (HOSPITAL_BASED_OUTPATIENT_CLINIC_OR_DEPARTMENT_OTHER): Payer: 59 | Admitting: Oncology

## 2023-06-05 ENCOUNTER — Telehealth: Payer: Self-pay

## 2023-06-05 ENCOUNTER — Telehealth: Payer: Self-pay | Admitting: *Deleted

## 2023-06-05 VITALS — BP 121/74 | HR 76 | Temp 96.1°F | Resp 18 | Wt 136.7 lb

## 2023-06-05 DIAGNOSIS — C20 Malignant neoplasm of rectum: Secondary | ICD-10-CM

## 2023-06-05 DIAGNOSIS — G620 Drug-induced polyneuropathy: Secondary | ICD-10-CM

## 2023-06-05 DIAGNOSIS — K769 Liver disease, unspecified: Secondary | ICD-10-CM

## 2023-06-05 DIAGNOSIS — Z95828 Presence of other vascular implants and grafts: Secondary | ICD-10-CM

## 2023-06-05 DIAGNOSIS — Z79899 Other long term (current) drug therapy: Secondary | ICD-10-CM | POA: Diagnosis not present

## 2023-06-05 DIAGNOSIS — T451X5A Adverse effect of antineoplastic and immunosuppressive drugs, initial encounter: Secondary | ICD-10-CM

## 2023-06-05 LAB — B12 AND FOLATE PANEL
Folate: 15.3 ng/mL
Vitamin B-12: 418 pg/mL (ref 200–1100)

## 2023-06-05 LAB — CMP (CANCER CENTER ONLY)
ALT: 15 U/L (ref 0–44)
AST: 19 U/L (ref 15–41)
Albumin: 3.8 g/dL (ref 3.5–5.0)
Alkaline Phosphatase: 95 U/L (ref 38–126)
Anion gap: 6 (ref 5–15)
BUN: 16 mg/dL (ref 6–20)
CO2: 25 mmol/L (ref 22–32)
Calcium: 8.9 mg/dL (ref 8.9–10.3)
Chloride: 104 mmol/L (ref 98–111)
Creatinine: 0.93 mg/dL (ref 0.44–1.00)
GFR, Estimated: >60 mL/min (ref >60)
Glucose, Bld: 155 mg/dL — ABNORMAL HIGH (ref 70–99)
Potassium: 4.1 mmol/L (ref 3.5–5.1)
Sodium: 135 mmol/L (ref 135–145)
Total Bilirubin: 0.3 mg/dL (ref 0.3–1.2)
Total Protein: 7 g/dL (ref 6.5–8.1)

## 2023-06-05 LAB — TSH: TSH: 2.72 mIU/L

## 2023-06-05 LAB — CBC WITH DIFFERENTIAL (CANCER CENTER ONLY)
Abs Immature Granulocytes: 0.03 10*3/uL (ref 0.00–0.07)
Basophils Absolute: 0.1 10*3/uL (ref 0.0–0.1)
Basophils Relative: 1 %
Eosinophils Absolute: 0.1 10*3/uL (ref 0.0–0.5)
Eosinophils Relative: 1 %
HCT: 40.3 % (ref 36.0–46.0)
Hemoglobin: 13.7 g/dL (ref 12.0–15.0)
Immature Granulocytes: 0 %
Lymphocytes Relative: 11 %
Lymphs Abs: 0.8 10*3/uL (ref 0.7–4.0)
MCH: 33 pg (ref 26.0–34.0)
MCHC: 34 g/dL (ref 30.0–36.0)
MCV: 97.1 fL (ref 80.0–100.0)
Monocytes Absolute: 0.5 10*3/uL (ref 0.1–1.0)
Monocytes Relative: 7 %
Neutro Abs: 5.8 10*3/uL (ref 1.7–7.7)
Neutrophils Relative %: 80 %
Platelet Count: 144 10*3/uL — ABNORMAL LOW (ref 150–400)
RBC: 4.15 MIL/uL (ref 3.87–5.11)
RDW: 13.6 % (ref 11.5–15.5)
WBC Count: 7.3 10*3/uL (ref 4.0–10.5)
nRBC: 0 % (ref 0.0–0.2)

## 2023-06-05 LAB — LIPID PANEL
Cholesterol: 204 mg/dL — ABNORMAL HIGH (ref <200)
HDL: 45 mg/dL — ABNORMAL LOW (ref >=50)
LDL Cholesterol (Calc): 114 mg/dL (calc) — ABNORMAL HIGH
Non-HDL Cholesterol (Calc): 159 mg/dL (calc) — ABNORMAL HIGH (ref <130)
Total CHOL/HDL Ratio: 4.5 (calc) (ref <5.0)
Triglycerides: 339 mg/dL — ABNORMAL HIGH (ref <150)

## 2023-06-05 LAB — MICROALBUMIN / CREATININE URINE RATIO
Creatinine, Urine: 39 mg/dL (ref 20–275)
Microalb Creat Ratio: 21 mg/g creat (ref <30)
Microalb, Ur: 0.8 mg/dL

## 2023-06-05 LAB — VITAMIN D 25 HYDROXY (VIT D DEFICIENCY, FRACTURES): Vit D, 25-Hydroxy: 26 ng/mL — ABNORMAL LOW (ref 30–100)

## 2023-06-05 MED ORDER — SODIUM CHLORIDE 0.9% FLUSH
10.0000 mL | INTRAVENOUS | Status: DC | PRN
  Filled 2023-06-05: qty 10

## 2023-06-05 MED ORDER — HEPARIN SOD (PORK) LOCK FLUSH 100 UNIT/ML IV SOLN
500.0000 [IU] | Freq: Once | INTRAVENOUS | Status: DC
  Filled 2023-06-05: qty 5

## 2023-06-05 NOTE — Patient Instructions (Signed)

## 2023-06-05 NOTE — Telephone Encounter (Signed)
Request for STAT US liver lesion biopsy - faxed to IR.   Will schedule MD 1 week after bx.

## 2023-06-05 NOTE — Progress Notes (Signed)
  Care Coordination   Note   06/05/2023 Name: April Luna MRN: [id number] DOB: 1971-04-11  April Luna is a 52 y.o. year old female who sees Alba Cory, MD for primary care. I reached out to [employer] by phone today to offer care coordination services.  April Luna was given information about Care Coordination services today including:   The Care Coordination services include support from the care team which includes your Nurse Coordinator, Clinical Social Worker, or Pharmacist.  The Care Coordination team is here to help remove barriers to the health concerns and goals most important to you. Care Coordination services are voluntary, and the patient may decline or stop services at any time by request to their care team member.   Care Coordination Consent Status: Patient agreed to services and verbal consent obtained.   Follow up plan:  Telephone appointment with care coordination team member scheduled for:  06/06/2023   Encounter Outcome:  Pt. Scheduled from referral   April Luna, [facility] Care Coordination Care Guide Direct Dial: [phone]

## 2023-06-05 NOTE — Assessment & Plan Note (Signed)
Grade 1, stabe 

## 2023-06-05 NOTE — Progress Notes (Signed)
Hematology/Oncology Progress note [phone] [phone]      CHIEF COMPLAINTS/REASON FOR VISIT:  Follow-up for rectal cancer treatments.  ASSESSMENT & PLAN:   Cancer Staging  Rectal cancer [facility]) Staging form: Colon and Rectum, AJCC 8th Edition - Pathologic stage from 05/03/2022: Stage IIIB (pT3, pN1b, cM0) - Signed by [name], MD on 05/03/2022   Rectal cancer (HCC) Stage III. pT3 pN1b cM0, s/p APR. Currently on adjuvant chemotherapy. S/p FOLFOX x 4 cycles 09/12/22 S/p concurrent chemotherapy [Xeloda 1300 mg BID] with RT  Finished adjuvant FOLFOX [dose reduced oxaliplatin and omit 5-FU bolus Labs reviewed and discussed with patient CEA is pending.  June 2024 CT scan is concerning for disease progression with liver metastasis.   Recommend ultrasound-guided biopsy to establish tissue diagnosis.    Chemotherapy-induced neuropathy (HCC) Grade 1, stabe  Orders Placed This Encounter  Procedures   US BIOPSY (LIVER)    Standing Status:   Future    Standing Expiration Date:   06/04/2024    Order Specific Question:   Lab orders requested (DO NOT place separate lab orders, these will be automatically ordered during procedure specimen collection):    Answer:   Surgical Pathology    Order Specific Question:   Reason for Exam (SYMPTOM  OR DIAGNOSIS REQUIRED)    Answer:   liver mass    Order Specific Question:   Preferred location?    Answer:   [facility]    Follow-up 1 week Liver lesion biopsy. All questions were answered. The patient knows to call the clinic with any problems, questions or concerns.  [name], MD, PhD [facility] Hematology Oncology 06/05/2023      HISTORY OF PRESENTING ILLNESS:   April Luna is a  52 y.o.  female presents for treatment of rectal cancer Oncology History  Rectal cancer (HCC)  02/26/2022 Imaging   MRI PELVIS WITHOUT CONTRAST- By imaging, rectal cancer stage:  T1/T2, N0, Mx    03/02/2022 Imaging   CT CHEST  AND ABDOMEN WITH CONTRAST 1. No convincing evidence of metastatic disease within the chest or abdomen. 2. Atrophic left kidney with multifocal renal scarring and cortical calcifications as well as nonobstructive renal stones measuring up to 5 mm. 3. Prominent left-sided predominant retroperitoneal lymph nodes measuring up to 8 mm near the level of the renal hilum, overall decreased in size dating back to CT September 19, 2018 and favored reactive related to left renal inflammation. 4.  Aortic Atherosclerosis (ICD10-I70.0).   03/27/2022 Genetic Testing    [facility] CustomNext+RNA cancer panel found no pathogenic mutations.    04/21/2022 Initial Diagnosis   Rectal cancer - baseline CEA 3.6 -02/09/2022, patient had colonoscopy which showed renal mass 10 cm from anal verge.  5 mm polyp in ascending colon.  Removed and retrieved. Pathology showed rectal adenocarcinoma.  The polyp in the ascending colon is a tubular adenoma.  MRI showed cT1/T2N0 disease  -04/21/2022, patient underwent robotic assisted ultralow anterior resection. Pathology showed moderately differentiated adenocarcinoma, 4.5 cm in maximal extent, with focal extension through muscularis propria into perirectal soft tissue.  3 lymph nodes positive for metastatic carcinoma.  Negative margin.  pT3 pN1b, MSI stable.   05/03/2022 Cancer Staging   Staging form: Colon and Rectum, AJCC 8th Edition - Pathologic stage from 05/03/2022: Stage IIIB (pT3, pN1b, cM0) - Signed by [name], MD on 05/03/2022 Stage prefix: Initial diagnosis   05/11/2022 Miscellaneous   Medi port placed by Dr.White   05/26/2022 -  Chemotherapy  FOLFOX Q2 weeks x 4   05/26/2022 - 07/09/2022 Chemotherapy   Patient is on Treatment Plan : COLORECTAL FOLFOX q14d x 8 cycles     05/26/2022 - 12/13/2022 Chemotherapy   Patient is on Treatment Plan : COLORECTAL FOLFOX q14d x 4 months     07/27/2022 -  Chemotherapy   Concurrent chemotherapy- Xeloda  1300mg  BID and radiation.     07/27/2022 - 09/12/2022 Radiation Therapy    concurrent chemotherapy [Xeloda 1300 mg BID] with RT    09/20/2022 Imaging   CT Angiogram chest PET protocol There is no evidence of pulmonary artery embolism. There is no evidence of thoracic aortic dissection.   Small linear patchy alveolar infiltrate is seen in medial segment of right middle lobe suggesting atelectasis/pneumonia.     09/28/2022 - 09/30/2022 Hospital Admission   Admission due to acute respiratory failure with hypoxia, COPD exacerbation   09/28/2022 Imaging   CT angio chest PE protocol 1. Negative for acute PE or thoracic aortic dissection. 2. New ground-glass infiltrates anteriorly in bilateral upper lobes,may represent atypical edema, infectious or inflammatory process. 3.  Aortic Atherosclerosis     12/22/2022 Imaging   CT chest abdomen pelvis w contrast  Stable exam. No evidence of recurrent or metastatic carcinoma within the chest, abdomen, or pelvis.   Left nephrolithiasis and renal parenchymal atrophy. No evidence of ureteral calculi or hydronephrosis.   Aortic Atherosclerosis   05/31/2023 Imaging   CT chest abdomen pelvis with contrast showed 1. New hypodense 16 mm lesion in the right lobe of the liver with very subtle adjacent 5 mm lesion, suspicious for metastatic disease. 2. Small bilateral pulmonary nodules, some of which were subtly evident on prior examination only in retrospect, some of which demonstrate degree of cavitation, suspicious for metastatic disease. 3. Prominent retroperitoneal lymph nodes are similar prior. 4. Prior low anterior resection with Hartmann's pouch formation,similar small amount of perirectal/presacral soft tissue and fluid,favored postsurgical/posttreatment change. Suggest continued attention on follow-up imaging. 5. Large volume of formed stool in the colon. 6. Nonobstructive left renal calculi measure under 1 cm.     Patient has bipolar and schizophrenia..  Patient is married and  lives at home with her husband and son.  INTERVAL HISTORY April Luna is a 52 y.o. female who has above history reviewed by me today presents for follow up visit for rectal cancer.  She feels well today.  She has gained weight since April 2024. Denies any melena or blood in the stool. Stable neuropathy symptoms.  Review of Systems  Constitutional:  Positive for fatigue. Negative for appetite change, chills and fever.  HENT:   Negative for hearing loss and voice change.   Eyes:  Negative for eye problems.  Respiratory:  Negative for chest tightness, cough and shortness of breath.   Cardiovascular:  Negative for chest pain.  Gastrointestinal:  Negative for abdominal distention, abdominal pain and blood in stool.       + colostomy  Endocrine: Negative for hot flashes.  Genitourinary:  Negative for difficulty urinating and frequency.   Musculoskeletal:  Negative for arthralgias.  Skin:  Negative for itching and rash.  Neurological:  Positive for numbness. Negative for extremity weakness.  Hematological:  Negative for adenopathy.  Psychiatric/Behavioral:  Negative for confusion.     MEDICAL HISTORY:  Past Medical History:  Diagnosis Date   Allergy    pollen   Anxiety    Arthritis    right hip   Bipolar 1 disorder (HCC)  Cancer [facility])    rectal   Chemotherapy-induced neuropathy (HCC) 10/24/2022   Chronic kidney disease    COPD (chronic obstructive pulmonary disease) (HCC)    Depression    Family history of adverse reaction to anesthesia    grand father had a stroke during anesthesia   Family history of breast cancer    Family history of colon cancer    Family history of uterine cancer    GERD (gastroesophageal reflux disease)    History of kidney stones    Hyperlipidemia    Hypertension    Hypothyroidism    Panic attack    Pneumonia    Psoriasis    Sleep apnea 08/11/2021   No CPAP   Type 2 diabetes mellitus with microalbuminuria, without long-term current  use of insulin (HCC) 06/24/2019    SURGICAL HISTORY: Past Surgical History:  Procedure Laterality Date   BREAST BIOPSY Left 12/14/2021   [place] bx, venus marker, path pending   CESAREAN SECTION     COLONOSCOPY WITH PROPOFOL N/A 02/09/2022   Procedure: COLONOSCOPY WITH PROPOFOL;  Surgeon: Toney Reil, MD;  Location: [facility];  Service: Endoscopy;  Laterality: N/A;  sleep apnea   CYSTOSCOPY W/ RETROGRADES Left 11/08/2018   Procedure: CYSTOSCOPY WITH RETROGRADE PYELOGRAM;  Surgeon: Sondra Come, MD;  Location: [facility];  Service: Urology;  Laterality: Left;   CYSTOSCOPY/URETEROSCOPY/HOLMIUM LASER/STENT PLACEMENT Left 11/08/2018   Procedure: CYSTOSCOPY/URETEROSCOPY/HOLMIUM LASER/STENT PLACEMENT;  Surgeon: Sondra Come, MD;  Location: [facility];  Service: Urology;  Laterality: Left;   IR IMAGING GUIDED PORT INSERTION  05/11/2022   MOUTH SURGERY     wisdom teeth extraction   MOUTH SURGERY     teeth removal   POLYPECTOMY N/A 02/09/2022   Procedure: POLYPECTOMY;  Surgeon: Toney Reil, MD;  Location: [facility];  Service: Endoscopy;  Laterality: N/A;   XI ROBOTIC ASSISTED LOWER ANTERIOR RESECTION N/A 04/21/2022   Procedure: XI ROBOTIC ASSISTED LOWER ANTERIOR RESECTION WITH COLOSTOMY, BILATERAL TAP BLOCK, ASSESSMENT OF TISSUE PERFUSSION WITH FIREFLY INJECTION;  Surgeon: Andria Meuse, MD;  Location: [facility];  Service: General;  Laterality: N/A;    SOCIAL HISTORY: Social History   Socioeconomic History   Marital status: Married    Spouse name: Lesle Chris   Number of children: 1   Years of education: 12   Highest education level: High school graduate  Occupational History   Occupation: unemployed    Comment: disabled  Tobacco Use   Smoking status: Some Days    Packs/day: 0.25    Years: 34.00    Additional pack years: 0.00    Total pack years: 8.50    Types: Cigarettes    Start date: 05/13/1985    Passive exposure: Current   Smokeless  tobacco: Never   Tobacco comments:    5-6 cigarettes weekly- 11/07/2022  Vaping Use   Vaping Use: Former   Start date: 05/25/2018   Quit date: 09/24/2018  Substance and Sexual Activity   Alcohol use: Not Currently    Alcohol/week: 4.0 standard drinks of alcohol    Types: 4 Glasses of wine per week    Comment: quit ETOH in Feb. 2023   Drug use: Not Currently    Types: Marijuana    Comment: 2 days ago   Sexual activity: Not Currently    Birth control/protection: Post-menopausal  Other Topics Concern   Not on file  Social History Narrative   Lives with husband and son    Social  Determinants of Health   Financial Resource Strain: High Risk (08/25/2022)   Overall Financial Resource Strain (CARDIA)    Difficulty of Paying Living Expenses: Hard  Food Insecurity: No Food Insecurity (09/20/2022)   Hunger Vital Sign    Worried About Running Out of Food in the Last Year: Never true    Ran Out of Food in the Last Year: Never true  Recent Concern: Food Insecurity - Food Insecurity Present (08/25/2022)   Hunger Vital Sign    Worried About Running Out of Food in the Last Year: Sometimes true    Ran Out of Food in the Last Year: Sometimes true  Transportation Needs: Unmet Transportation Needs (03/15/2023)   PRAPARE - Administrator, Civil Service (Medical): Yes    Lack of Transportation (Non-Medical): Yes  Physical Activity: Insufficiently Active (08/25/2022)   Exercise Vital Sign    Days of Exercise per Week: 1 day    Minutes of Exercise per Session: 10 min  Stress: Stress Concern Present (08/25/2022)   [employer] of Occupational Health - Occupational Stress Questionnaire    Feeling of Stress : To some extent  Social Connections: Moderately Integrated (08/25/2022)   Social Connection and Isolation Panel [NHANES]    Frequency of Communication with Friends and Family: More than three times a week    Frequency of Social Gatherings with Friends and Family: Twice a week    Attends  Religious Services: Never    Database administrator or Organizations: No    Attends Engineer, structural: 1 to 4 times per year    Marital Status: Married  Catering manager Violence: Not At Risk (09/20/2022)   Humiliation, Afraid, Rape, and Kick questionnaire    Fear of Current or Ex-Partner: No    Emotionally Abused: No    Physically Abused: No    Sexually Abused: No    FAMILY HISTORY: Family History  Problem Relation Age of Onset   Depression Mother    Anxiety disorder Mother    Diabetes Mother    Hypertension Mother    Hyperlipidemia Mother    Cancer Mother    Uterine cancer Mother 23   Cervical cancer Mother 8   Colon cancer Father    Depression Brother    Anxiety disorder Brother    Cancer Maternal Aunt        unk types   Diabetes Mellitus II Maternal Grandmother    Hypercholesterolemia Maternal Grandmother    Breast cancer Maternal Grandmother    Cancer Paternal Grandmother    Diabetes Paternal Grandmother    Melanoma Paternal Grandmother    Stomach cancer Paternal Grandmother     ALLERGIES:  is allergic to metformin and related, nsaids, perphenazine, sulfa antibiotics, abilify [aripiprazole], and penicillins.  MEDICATIONS:  Current Outpatient Medications  Medication Sig Dispense Refill   ACCU-CHEK GUIDE test strip USE UP TO FOUR TIMES DAILY AS DIRECTED. 100 each 5   acetaminophen (TYLENOL) 325 MG tablet Take 650 mg by mouth every 6 (six) hours as needed for headache (pain).     albuterol (PROVENTIL) (2.5 MG/3ML) 0.083% nebulizer solution Take 3 mLs (2.5 mg total) by nebulization every 6 (six) hours as needed for shortness of breath or wheezing. 75 mL 0   albuterol (VENTOLIN HFA) 108 (90 Base) MCG/ACT inhaler Inhale 2 puffs into the lungs every 6 (six) hours as needed for wheezing or shortness of breath. 1 each 5   amantadine (SYMMETREL) 100 MG capsule Take 100 mg by mouth 2 (  two) times daily.     blood glucose meter kit and supplies Dispense based on  patient and insurance preference. Use up to four times daily as directed. (FOR ICD-10 E10.9, E11.9). 1 each 0   Budeson-Glycopyrrol-Formoterol (BREZTRI AEROSPHERE) 160-9-4.8 MCG/ACT AERO Inhale 2 puffs into the lungs in the morning and at bedtime. 5.9 g 11   buPROPion (WELLBUTRIN XL) 300 MG 24 hr tablet Take 300 mg by mouth daily.     Cholecalciferol (VITAMIN D-3) 125 MCG (5000 UT) TABS Take 5,000 Units by mouth daily. 30 tablet 1   dapagliflozin propanediol (FARXIGA) 10 MG TABS tablet Take 1 tablet (10 mg total) by mouth daily before breakfast. 30 tablet 0   diltiazem (CARDIZEM CD) 120 MG 24 hr capsule Take 1 capsule (120 mg total) by mouth daily. 90 capsule 1   docusate sodium (COLACE) 100 MG capsule TAKE 1 CAPSULE BY MOUTH TWICE DAILY 60 capsule 1   fluconazole (DIFLUCAN) 150 MG tablet Take 1 tablet (150 mg total) by mouth every 3 (three) days as needed (for vaginal itching/yeast infection sx). 2 tablet 0   gabapentin (NEURONTIN) 300 MG capsule Take 300 mg by mouth 2 (two) times daily.     hydrocortisone cream 0.5 % Apply 1 Application topically 2 (two) times daily as needed for itching. 30 g 1   Hydrocortisone, Perianal, 1 % CREA Apply topically.     hydrOXYzine (VISTARIL) 50 MG capsule Take 50 mg by mouth 3 (three) times daily as needed for anxiety.     icosapent Ethyl (VASCEPA) 1 g capsule TAKE 2 CAPSULES BY MOUTH 2 TIMES DAILY 120 capsule 3   Insulin Pen Needle 32G X 4 MM MISC 1 each by Does not apply route daily at 12 noon. 100 each 0   ipratropium-albuterol (DUONEB) 0.5-2.5 (3) MG/3ML SOLN Take 3 mLs by nebulization every 6 (six) hours as needed. 360 mL 11   levothyroxine (SYNTHROID) 75 MCG tablet TAKE 1 TABLET BY MOUTH DAILY BEFORE BREAKFAST 90 tablet 2   loratadine (CLARITIN) 10 MG tablet Take 10 mg by mouth every morning.     losartan (COZAAR) 25 MG tablet Take 1 tablet (25 mg total) by mouth at bedtime. 30 tablet 3   NON FORMULARY Pt uses a cpap nightly     nystatin cream (MYCOSTATIN)  Apply 1 Application topically 2 (two) times daily. 30 g 0   oxybutynin (DITROPAN-XL) 10 MG 24 hr tablet Take 1 tablet (10 mg total) by mouth daily. 30 tablet 11   paliperidone (INVEGA SUSTENNA) 234 MG/1.5ML SUSY injection Inject 234 mg into the muscle every 30 (thirty) days. On or about the 14th of each month     pantoprazole (PROTONIX) 40 MG tablet TAKE 1 TABLET BY MOUTH DAILY 90 tablet 2   Pediatric Multivit-Minerals-C (CHEWABLES MULTIVITAMIN PO) Take 1 tablet by mouth daily.     pioglitazone (ACTOS) 15 MG tablet Take 1 tablet (15 mg total) by mouth daily. 90 tablet 1   rosuvastatin (CRESTOR) 40 MG tablet TAKE 1 TABLET BY MOUTH EVERY MORNING 30 tablet 3   sertraline (ZOLOFT) 100 MG tablet Take 200 mg by mouth every morning.     Spacer/Aero-Holding Chambers (AEROCHAMBER MV) inhaler Use as instructed 1 each 0   No current facility-administered medications for this visit.     PHYSICAL EXAMINATION: ECOG PERFORMANCE STATUS: 0 - Asymptomatic Vitals:   06/05/23 1013  BP: 121/74  Pulse: 76  Resp: 18  Temp: (!) 96.1 F (35.6 C)  SpO2: 96%  Filed Weights   06/05/23 1013  Weight: 136 lb 11.2 oz (62 kg)     Physical Exam Constitutional:      General: She is not in acute distress. HENT:     Head: Normocephalic and atraumatic.  Eyes:     General: No scleral icterus. Cardiovascular:     Rate and Rhythm: Normal rate and regular rhythm.     Heart sounds: Normal heart sounds.  Pulmonary:     Effort: Pulmonary effort is normal. No respiratory distress.     Breath sounds: No wheezing.     Comments: Decreased breath sound bilaterally Abdominal:     General: Bowel sounds are normal. There is no distension.     Palpations: Abdomen is soft.     Comments: +colostomy   Musculoskeletal:        General: No deformity. Normal range of motion.     Cervical back: Normal range of motion and neck supple.  Skin:    General: Skin is warm and dry.     Findings: No erythema or rash.   Neurological:     Mental Status: She is alert and oriented to person, place, and time. Mental status is at baseline.     Cranial Nerves: No cranial nerve deficit.     Coordination: Coordination normal.  Psychiatric:        Mood and Affect: Mood normal.     LABORATORY DATA:  I have reviewed the data as listed    Latest Ref Rng & Units 06/05/2023    9:41 AM 03/05/2023   10:00 AM 01/22/2023   10:09 AM  CBC  WBC 4.0 - 10.5 K/uL 7.3  8.7  8.7   Hemoglobin 12.0 - 15.0 g/dL 45.4  09.8  11.9   Hematocrit 36.0 - 46.0 % 40.3  44.1  41.5   Platelets 150 - 400 K/uL 144  153  144       Latest Ref Rng & Units 06/05/2023    9:41 AM 03/05/2023   10:00 AM 01/22/2023   10:09 AM  CMP  Glucose 70 - 99 mg/dL 147  829  562   BUN 6 - 20 mg/dL 16  14  9    Creatinine 0.44 - 1.00 mg/dL 1.30  8.65  7.84   Sodium 135 - 145 mmol/L 135  135  136   Potassium 3.5 - 5.1 mmol/L 4.1  3.8  3.7   Chloride 98 - 111 mmol/L 104  101  102   CO2 22 - 32 mmol/L 25  25  24    Calcium 8.9 - 10.3 mg/dL 8.9  9.1  8.9   Total Protein 6.5 - 8.1 g/dL 7.0  7.3  6.8   Total Bilirubin 0.3 - 1.2 mg/dL 0.3  0.5  0.4   Alkaline Phos 38 - 126 U/L 95  93  68   AST 15 - 41 U/L 19  21  26    ALT 0 - 44 U/L 15  12  14          RADIOGRAPHIC STUDIES: I have personally reviewed the radiological images as listed and agreed with the findings in the report. CT CHEST ABDOMEN PELVIS W CONTRAST  Result Date: 05/31/2023 CLINICAL DATA:  History of rectal cancer, follow-up/restaging. * Tracking Code: BO * EXAM: CT CHEST, ABDOMEN, AND PELVIS WITH CONTRAST TECHNIQUE: Multidetector CT imaging of the chest, abdomen and pelvis was performed following the standard protocol during bolus administration of intravenous contrast. RADIATION DOSE REDUCTION: This exam was performed according  to the departmental dose-optimization program which includes automated exposure control, adjustment of the mA and/or kV according to patient size and/or use of iterative  reconstruction technique. CONTRAST:  OMNIPAQUE IOHEXOL 300 MG/ML  SOLN COMPARISON:  Multiple priors including most recent CT December 21, 2022 FINDINGS: CT CHEST FINDINGS Cardiovascular: Right chest Port-A-Cath with tip near the superior cavoatrial junction. Normal caliber thoracic aorta. No central pulmonary embolus on this nondedicated study. Normal size heart. No significant pericardial effusion/thickening. Mediastinum/Nodes: No suspicious thyroid nodule. No pathologically enlarged mediastinal, hilar or axillary lymph nodes. Reflux versus retained contrast in the esophagus. Lungs/Pleura: Small bilateral pulmonary nodules, some of which were subtly evident on prior examination only in retrospect, some of which demonstrate degree of cavitation. For reference: -solid 4 mm right middle lobe pulmonary nodule on image 77/3 previously 2 mm. -solid centrally cavitary pulmonary nodule in the left upper lobe measuring 6 mm on image 52/3 previously 3 mm.-partially cavitary subpleural left lower lobe pulmonary nodule now measuring 5 mm on image 74/3 previously 2-3 mm. No pleural effusion.  No pneumothorax. Musculoskeletal: No aggressive lytic or blastic lesion of bone. CT ABDOMEN PELVIS FINDINGS Hepatobiliary: New hypodense 16 mm lesion in the right lobe of the liver on image 51/4. Very subtle adjacent 5 mm lesion on image 51/2. Gallbladder is unremarkable. No biliary ductal dilation. Pancreas: No pancreatic ductal dilation or evidence of acute inflammation. Spleen: No splenomegaly or focal splenic lesion. Adrenals/Urinary Tract: Bilateral adrenal glands appear normal. No hydronephrosis. Left renal parenchymal atrophy/scarring. Multiple nonobstructive left renal calculi measure under 1 cm. Multiple small benign renal cysts are again seen bilaterally. Urinary bladder is unremarkable for degree of distension. Stomach/Bowel: Radiopaque enteric contrast material traverses the stomach. No pathologic dilation of small or  large bowel. Large volume of formed stool in the colon. Left anterior abdominal wall end colostomy. 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 Similar small amount of perirectal/presacral soft tissue and fluid. Vascular/Lymphatic: Normal caliber abdominal aorta. Smooth IVC contours. Prominent retroperitoneal lymph nodes are similar prior for instance the previously indexed left periaortic lymph node measures 9 mm on image 64/2, unchanged. Reproductive: Uterus and bilateral adnexa are unremarkable. Other: No significant abdominopelvic free fluid. No discrete peritoneal or mental nodularity. Soft tissue nodularity in the posterior gluteal subcutaneous fat for instance on image 88/2 is unchanged. Musculoskeletal: Postradiation change in the sacrum. Hyperdense focus in the S2 vertebral body is stable from prior studies and likely reflects a bone island. IMPRESSION: 1. New hypodense 16 mm lesion in the right lobe of the liver with very subtle adjacent 5 mm lesion, suspicious for metastatic disease. 2. Small bilateral pulmonary nodules, some of which were subtly evident on prior examination only in retrospect, some of which demonstrate degree of cavitation, suspicious for metastatic disease. 3. Prominent retroperitoneal lymph nodes are similar prior. 4. Prior low anterior resection with Hartmann's pouch formation, similar small amount of perirectal/presacral soft tissue and fluid, favored postsurgical/posttreatment change. Suggest continued attention on follow-up imaging. 5. Large volume of formed stool in the colon. 6. Nonobstructive left renal calculi measure under 1 cm. These results will be called to the ordering clinician or representative by the Radiologist Assistant, and communication documented in the PACS or [employer]. Electronically Signed   By: Maudry Mayhew M.D.   On: 05/31/2023 16:52

## 2023-06-05 NOTE — Assessment & Plan Note (Addendum)
Stage III. pT3 pN1b cM0, s/p APR. Currently on adjuvant chemotherapy. S/p FOLFOX x 4 cycles 09/12/22 S/p concurrent chemotherapy [Xeloda 1300 mg BID] with RT  Finished adjuvant FOLFOX [dose reduced oxaliplatin and omit 5-FU bolus Labs reviewed and discussed with patient CEA is pending.  June 2024 CT scan is concerning for disease progression with liver metastasis.   Recommend ultrasound-guided biopsy to establish tissue diagnosis.

## 2023-06-06 ENCOUNTER — Ambulatory Visit: Payer: Self-pay

## 2023-06-06 ENCOUNTER — Encounter: Payer: Self-pay | Admitting: Nurse Practitioner

## 2023-06-06 LAB — CEA: CEA: 6.7 ng/mL — ABNORMAL HIGH (ref 0.0–4.7)

## 2023-06-06 NOTE — Patient Instructions (Signed)
Visit Information  Thank you for taking time to visit with me today. Please don't hesitate to contact me if I can be of assistance to you.   Following are the goals we discussed today:   Goals Addressed             This Visit's Progress    Find housing, consolidate/reduce [facility] bills, food assistance       -Patient wants help with available housing -Patient wants financial resources to help with funds to move -Patient wants food assistance -Patient wants to reduce medical bills from [facility]        Our next appointment is by telephone on 07/04/23 at 1:00 pm  Please call the care guide team at [phone] if you need to cancel or reschedule your appointment.   If you are experiencing a Mental Health or Behavioral Health Crisis or need someone to talk to, please call 911  Patient verbalizes understanding of instructions and care plan provided today and agrees to view in MyChart. Active MyChart status and patient understanding of how to access instructions and care plan via MyChart confirmed with patient.     Telephone follow up appointment with care management team member scheduled for: 07/04/23 at 1pm  Lysle Morales, BSW Social Worker [facility] Care Management  [phone]

## 2023-06-06 NOTE — Progress Notes (Signed)
Gilmer Mor, DO sent to April Luna OK for attempt at US guided right liver mass biopsy.    CT 05/31/23, image 51/120 #2.  April Luna       Previous Messages    ----- Message ----- From: April Luna Sent: 06/05/2023   1:13 PM EDT To: Ir Procedure Requests Subject: US Biopsy                                      IR Approval Request:   Procedure:   US guided Liver lesion biopsy  Reason:       liver lesion  History:        CT Chest Abd Pelvis w contrast   05/31/2023  Provider:     Dr [name], MD  Provider Contact #   [facility]    [phone]

## 2023-06-06 NOTE — Telephone Encounter (Signed)
Pt scheduled for liver bx on Fri 6/14 @ 9:30, arrive 8:30. She is aware of bx appt   Please schedule MD only 7-10 days after 6/14 and inform pt of appt details.

## 2023-06-06 NOTE — Patient Outreach (Signed)
  Care Coordination   Initial Visit Note   06/06/2023 Name: April Luna MRN: [id number] DOB: 03/19/1971  April Luna is a 52 y.o. year old female who sees Alba Cory, MD for primary care. I spoke with  April Luna by phone today.  What matters to the patients health and wellness today?  Patient lease was not renewed after April 2024 because of patients husband eviction from over 7 years ago.  Patient and her 61 yr old son have been living in a room at a friends house and pays $650 rent.  Patients husband works and stays at another location.  Patient has not looked for housing since the eviction.  Frederich Chick works with patient and helps with errands.      Goals Addressed             This Visit's Progress    Find housing, consolidate/reduce [facility] bills, food assistance       -Patient wants help with available housing -Patient wants financial resources to help with funds to move -Patient wants food assistance -Patient wants to reduce medical bills from [facility]        SDOH assessments and interventions completed:  Yes  SDOH Interventions Today    Flowsheet Row Most Recent Value  SDOH Interventions   Food Insecurity Interventions Other (Comment)  [Not eligible for FNS due to husbands wages and disability for both patient and son]  Transportation Interventions --  [Uses transportation from [facility] and [employer] staff assists with errands]  Utilities Interventions Intervention Not Indicated        Care Coordination Interventions:  Yes, provided  Care Coordination Interventions: SW provided several community resources, food banks and churches to assist   SW provided [employer] for affordable housing. SW recommending contacting [place] billing to consolidate bills   Follow up plan: Follow up call scheduled for 07/04/23 at 1:00 pm    Encounter Outcome:  Pt. Visit Completed

## 2023-06-07 ENCOUNTER — Other Ambulatory Visit: Payer: Self-pay | Admitting: Internal Medicine

## 2023-06-07 ENCOUNTER — Other Ambulatory Visit: Payer: Self-pay | Admitting: Student

## 2023-06-07 DIAGNOSIS — R160 Hepatomegaly, not elsewhere classified: Secondary | ICD-10-CM

## 2023-06-07 NOTE — Progress Notes (Signed)
Patient for US guided Liver Biopsy on Fri 06/08/2023, Lovette Cliche called and spoke with the patient on the phone and gave pre-procedure instructions. Pt was made aware to be here at 8:30a, NPO after MN prior to procedure as well as driver post procedure/recovery/discharge. Lanora Manis stated pt understood.  Called 06/06/2023  I was unable to reach pt by phone.

## 2023-06-08 ENCOUNTER — Other Ambulatory Visit: Payer: Self-pay

## 2023-06-08 ENCOUNTER — Ambulatory Visit
Admission: RE | Admit: 2023-06-08 | Discharge: 2023-06-08 | Disposition: A | Discharge status: Home / Self Care | Payer: 59 | Source: Ambulatory Visit | Attending: Oncology | Admitting: Oncology

## 2023-06-08 DIAGNOSIS — R160 Hepatomegaly, not elsewhere classified: Secondary | ICD-10-CM | POA: Diagnosis not present

## 2023-06-08 DIAGNOSIS — Z85040 Personal history of malignant carcinoid tumor of rectum: Secondary | ICD-10-CM | POA: Diagnosis not present

## 2023-06-08 DIAGNOSIS — Z85048 Personal history of other malignant neoplasm of rectum, rectosigmoid junction, and anus: Secondary | ICD-10-CM | POA: Diagnosis not present

## 2023-06-08 DIAGNOSIS — K769 Liver disease, unspecified: Secondary | ICD-10-CM

## 2023-06-08 LAB — CBC WITH DIFFERENTIAL/PLATELET
Abs Immature Granulocytes: 0.02 10*3/uL (ref 0.00–0.07)
Basophils Absolute: 0 10*3/uL (ref 0.0–0.1)
Basophils Relative: 0 %
Eosinophils Absolute: 0.1 10*3/uL (ref 0.0–0.5)
Eosinophils Relative: 1 %
HCT: 40.7 % (ref 36.0–46.0)
Hemoglobin: 13.7 g/dL (ref 12.0–15.0)
Immature Granulocytes: 0 %
Lymphocytes Relative: 9 %
Lymphs Abs: 0.8 10*3/uL (ref 0.7–4.0)
MCH: 33.2 pg (ref 26.0–34.0)
MCHC: 33.7 g/dL (ref 30.0–36.0)
MCV: 98.5 fL (ref 80.0–100.0)
Monocytes Absolute: 0.5 10*3/uL (ref 0.1–1.0)
Monocytes Relative: 7 %
Neutro Abs: 6.8 10*3/uL (ref 1.7–7.7)
Neutrophils Relative %: 83 %
Platelets: 143 10*3/uL — ABNORMAL LOW (ref 150–400)
RBC: 4.13 MIL/uL (ref 3.87–5.11)
RDW: 14 % (ref 11.5–15.5)
WBC: 8.2 10*3/uL (ref 4.0–10.5)
nRBC: 0 % (ref 0.0–0.2)

## 2023-06-08 LAB — PROTIME-INR
INR: 1.1 (ref 0.8–1.2)
Prothrombin Time: 14.3 seconds (ref 11.4–15.2)

## 2023-06-08 LAB — COMPREHENSIVE METABOLIC PANEL
ALT: 19 U/L (ref 0–44)
AST: 23 U/L (ref 15–41)
Albumin: 3.8 g/dL (ref 3.5–5.0)
Alkaline Phosphatase: 95 U/L (ref 38–126)
Anion gap: 10 (ref 5–15)
BUN: 16 mg/dL (ref 6–20)
CO2: 26 mmol/L (ref 22–32)
Calcium: 8.8 mg/dL — ABNORMAL LOW (ref 8.9–10.3)
Chloride: 104 mmol/L (ref 98–111)
Creatinine, Ser: 0.82 mg/dL (ref 0.44–1.00)
GFR, Estimated: >60 mL/min (ref >60)
Glucose, Bld: 119 mg/dL — ABNORMAL HIGH (ref 70–99)
Potassium: 4 mmol/L (ref 3.5–5.1)
Sodium: 140 mmol/L (ref 135–145)
Total Bilirubin: 0.4 mg/dL (ref 0.3–1.2)
Total Protein: 7 g/dL (ref 6.5–8.1)

## 2023-06-08 LAB — GLUCOSE, CAPILLARY
Glucose-Capillary: 119 mg/dL — ABNORMAL HIGH (ref 70–99)
Glucose-Capillary: 107 mg/dL — ABNORMAL HIGH (ref 70–99)

## 2023-06-08 MED ORDER — LIDOCAINE HCL (PF) 1 % IJ SOLN
10.0000 mL | Freq: Once | INTRAMUSCULAR | Status: AC
  Administered 2023-06-08: 10 mL via INTRADERMAL

## 2023-06-08 MED ORDER — HYDROCODONE-ACETAMINOPHEN 5-325 MG PO TABS: 1.0000 | ORAL_TABLET | ORAL | Status: DC | PRN

## 2023-06-08 MED ORDER — MIDAZOLAM HCL 2 MG/2ML IJ SOLN
INTRAMUSCULAR | Status: AC | PRN
  Administered 2023-06-08: 1 mg via INTRAVENOUS

## 2023-06-08 MED ORDER — MIDAZOLAM HCL 2 MG/2ML IJ SOLN
INTRAMUSCULAR | Status: AC
  Filled 2023-06-08: qty 2

## 2023-06-08 MED ORDER — FENTANYL CITRATE (PF) 100 MCG/2ML IJ SOLN
INTRAMUSCULAR | Status: AC
  Filled 2023-06-08: qty 2

## 2023-06-08 MED ORDER — SODIUM CHLORIDE 0.9 % IV SOLN: INTRAVENOUS | Status: DC

## 2023-06-08 MED ORDER — FENTANYL CITRATE (PF) 100 MCG/2ML IJ SOLN
INTRAMUSCULAR | Status: AC | PRN
  Administered 2023-06-08: 50 ug via INTRAVENOUS

## 2023-06-08 NOTE — H&P (Signed)
Chief Complaint: Patient was seen in consultation today for liver mass  Referring Physician(s): Yu,Zhou  Supervising Physician: Roanna Banning  Patient Status: [facility] - Out-pt  History of Present Illness: April Luna is a 52 y.o. female with PMH including anxiety, rectal cancer, CKD, COPD, hypertension, panic attack, sleep apnea, and type 2 diabetes mellitus being seen today in relation to newly discovered liver mass. Patient has known history of rectal cancer, and new liver mass is concerning for possible metastasis. Patient is under the care of Dr Cathie Hoops from Oncology who has referred patient to IR for evaluation.  Past Medical History:  Diagnosis Date   Allergy    pollen   Anxiety    Arthritis    right hip   Bipolar 1 disorder (HCC)    Cancer (HCC)    rectal   Chemotherapy-induced neuropathy (HCC) 10/24/2022   Chronic kidney disease    COPD (chronic obstructive pulmonary disease) (HCC)    Depression    Family history of adverse reaction to anesthesia    grand father had a stroke during anesthesia   Family history of breast cancer    Family history of colon cancer    Family history of uterine cancer    GERD (gastroesophageal reflux disease)    History of kidney stones    Hyperlipidemia    Hypertension    Hypothyroidism    Panic attack    Pneumonia    Psoriasis    Sleep apnea 08/11/2021   No CPAP   Type 2 diabetes mellitus with microalbuminuria, without long-term current use of insulin (HCC) 06/24/2019    Past Surgical History:  Procedure Laterality Date   BREAST BIOPSY Left 12/14/2021   [place] bx, venus marker, path pending   CESAREAN SECTION     COLONOSCOPY WITH PROPOFOL N/A 02/09/2022   Procedure: COLONOSCOPY WITH PROPOFOL;  Surgeon: Toney Reil, MD;  Location: [facility];  Service: Endoscopy;  Laterality: N/A;  sleep apnea   CYSTOSCOPY W/ RETROGRADES Left 11/08/2018   Procedure: CYSTOSCOPY WITH RETROGRADE PYELOGRAM;  Surgeon: Sondra Come, MD;  Location: [facility];  Service: Urology;  Laterality: Left;   CYSTOSCOPY/URETEROSCOPY/HOLMIUM LASER/STENT PLACEMENT Left 11/08/2018   Procedure: CYSTOSCOPY/URETEROSCOPY/HOLMIUM LASER/STENT PLACEMENT;  Surgeon: Sondra Come, MD;  Location: [facility];  Service: Urology;  Laterality: Left;   IR IMAGING GUIDED PORT INSERTION  05/11/2022   MOUTH SURGERY     wisdom teeth extraction   MOUTH SURGERY     teeth removal   POLYPECTOMY N/A 02/09/2022   Procedure: POLYPECTOMY;  Surgeon: Toney Reil, MD;  Location: [facility];  Service: Endoscopy;  Laterality: N/A;   XI ROBOTIC ASSISTED LOWER ANTERIOR RESECTION N/A 04/21/2022   Procedure: XI ROBOTIC ASSISTED LOWER ANTERIOR RESECTION WITH COLOSTOMY, BILATERAL TAP BLOCK, ASSESSMENT OF TISSUE PERFUSSION WITH FIREFLY INJECTION;  Surgeon: Andria Meuse, MD;  Location: [facility];  Service: General;  Laterality: N/A;    Allergies: Metformin and related, Nsaids, Perphenazine, Sulfa antibiotics, Abilify [aripiprazole], and Penicillins  Medications: Prior to Admission medications   Medication Sig Start Date End Date Taking? Authorizing Provider  ACCU-CHEK GUIDE test strip USE UP TO FOUR TIMES DAILY AS DIRECTED. 10/11/22   Alba Cory, MD  acetaminophen (TYLENOL) 325 MG tablet Take 650 mg by mouth every 6 (six) hours as needed for headache (pain).    [provider]  albuterol (PROVENTIL) (2.5 MG/3ML) 0.083% nebulizer solution Take 3 mLs (2.5 mg total) by nebulization every 6 (six) hours as needed  for shortness of breath or wheezing. 09/24/22 06/05/23  Alford Highland, MD  albuterol (VENTOLIN HFA) 108 (90 Base) MCG/ACT inhaler Inhale 2 puffs into the lungs every 6 (six) hours as needed for wheezing or shortness of breath. 04/13/22   Coralyn Helling, MD  amantadine (SYMMETREL) 100 MG capsule Take 100 mg by mouth 2 (two) times daily. 03/19/18   Oletta Darter, MD  blood glucose meter kit and supplies Dispense based on patient  and insurance preference. Use up to four times daily as directed. (FOR ICD-10 E10.9, E11.9). 03/15/22   Alba Cory, MD  Budeson-Glycopyrrol-Formoterol (BREZTRI AEROSPHERE) 160-9-4.8 MCG/ACT AERO Inhale 2 puffs into the lungs in the morning and at bedtime. 04/13/22   Coralyn Helling, MD  buPROPion (WELLBUTRIN XL) 300 MG 24 hr tablet Take 300 mg by mouth daily. 09/11/22   [provider]  Cholecalciferol (VITAMIN D-3) 125 MCG (5000 UT) TABS Take 5,000 Units by mouth daily. 10/14/19   Alba Cory, MD  dapagliflozin propanediol (FARXIGA) 10 MG TABS tablet Take 1 tablet (10 mg total) by mouth daily before breakfast. 05/11/23   Alba Cory, MD  diltiazem (CARDIZEM CD) 120 MG 24 hr capsule Take 1 capsule (120 mg total) by mouth daily. 06/04/23   Alba Cory, MD  docusate sodium (COLACE) 100 MG capsule TAKE 1 CAPSULE BY MOUTH TWICE DAILY 05/28/23   [name], MD  fluconazole (DIFLUCAN) 150 MG tablet Take 1 tablet (150 mg total) by mouth every 3 (three) days as needed (for vaginal itching/yeast infection sx). 04/11/23   Berniece Salines, FNP  gabapentin (NEURONTIN) 300 MG capsule Take 300 mg by mouth 2 (two) times daily.    [facility] [address] [facility] [employer]  hydrocortisone cream 0.5 % Apply 1 Application topically 2 (two) times daily as needed for itching. 06/23/22   [name], MD  Hydrocortisone, Perianal, 1 % CREA Apply topically. 06/23/22   [provider]  hydrOXYzine (VISTARIL) 50 MG capsule Take 50 mg by mouth 3 (three) times daily as needed for anxiety.    [facility] [address] [facility] [employer]  icosapent Ethyl (VASCEPA) 1 g capsule TAKE 2 CAPSULES BY MOUTH 2 TIMES DAILY 12/26/22   Carlynn Purl, Danna Hefty, MD  Insulin Pen Needle 32G X 4 MM MISC 1 each by Does not apply route daily at 12 noon. 10/05/22   Alba Cory, MD  ipratropium-albuterol (DUONEB) 0.5-2.5 (3) MG/3ML SOLN Take 3 mLs by nebulization every 6 (six) hours as needed. 10/11/22 10/11/23  Raechel Chute,  MD  levothyroxine (SYNTHROID) 75 MCG tablet TAKE 1 TABLET BY MOUTH DAILY BEFORE BREAKFAST 10/09/22   Alba Cory, MD  loratadine (CLARITIN) 10 MG tablet Take 10 mg by mouth every morning.    [provider]  losartan (COZAAR) 25 MG tablet Take 1 tablet (25 mg total) by mouth at bedtime. 12/26/22   Alba Cory, MD  NON FORMULARY Pt uses a cpap nightly    [provider]  nystatin cream (MYCOSTATIN) Apply 1 Application topically 2 (two) times daily. 04/11/23   Berniece Salines, FNP  oxybutynin (DITROPAN-XL) 10 MG 24 hr tablet Take 1 tablet (10 mg total) by mouth daily. 01/08/23   Sondra Come, MD  paliperidone (INVEGA SUSTENNA) 234 MG/1.5ML SUSY injection Inject 234 mg into the muscle every 30 (thirty) days. On or about the 14th of each month    Inc, [facility] [place] [facility]  pantoprazole (PROTONIX) 40 MG tablet TAKE 1 TABLET BY MOUTH DAILY 10/09/22  Alba Cory, MD  Pediatric Multivit-Minerals-C (CHEWABLES MULTIVITAMIN PO) Take 1 tablet by mouth daily.    [provider]  pioglitazone (ACTOS) 15 MG tablet Take 1 tablet (15 mg total) by mouth daily. 06/04/23   Alba Cory, MD  rosuvastatin (CRESTOR) 40 MG tablet TAKE 1 TABLET BY MOUTH EVERY MORNING 05/28/23   Alba Cory, MD  sertraline (ZOLOFT) 100 MG tablet Take 200 mg by mouth every morning.    [facility] & [employer]  Spacer/Aero-Holding Chambers (AEROCHAMBER MV) inhaler Use as instructed 04/13/22   Coralyn Helling, MD     Family History  Problem Relation Age of Onset   Depression Mother    Anxiety disorder Mother    Diabetes Mother    Hypertension Mother    Hyperlipidemia Mother    Cancer Mother    Uterine cancer Mother 41   Cervical cancer Mother 55   Colon cancer Father    Depression Brother    Anxiety disorder Brother    Cancer Maternal Aunt        unk types   Diabetes Mellitus II Maternal Grandmother    Hypercholesterolemia Maternal Grandmother    Breast cancer  Maternal Grandmother    Cancer Paternal Grandmother    Diabetes Paternal Grandmother    Melanoma Paternal Grandmother    Stomach cancer Paternal Grandmother     Social History   Socioeconomic History   Marital status: Married    Spouse name: Lesle Chris   Number of children: 1   Years of education: 12   Highest education level: High school graduate  Occupational History   Occupation: unemployed    Comment: disabled  Tobacco Use   Smoking status: Some Days    Packs/day: 0.25    Years: 34.00    Additional pack years: 0.00    Total pack years: 8.50    Types: Cigarettes    Start date: 05/13/1985    Passive exposure: Current   Smokeless tobacco: Never   Tobacco comments:    5-6 cigarettes weekly- 11/07/2022  Vaping Use   Vaping Use: Former   Start date: 05/25/2018   Quit date: 09/24/2018  Substance and Sexual Activity   Alcohol use: Not Currently    Alcohol/week: 4.0 standard drinks of alcohol    Types: 4 Glasses of wine per week    Comment: quit ETOH in Feb. 2023   Drug use: Not Currently    Types: Marijuana    Comment: 2 days ago   Sexual activity: Not Currently    Birth control/protection: Post-menopausal  Other Topics Concern   Not on file  Social History Narrative   Lives with husband and son    Social Determinants of Health   Financial Resource Strain: High Risk (08/25/2022)   Overall Financial Resource Strain (CARDIA)    Difficulty of Paying Living Expenses: Hard  Food Insecurity: Food Insecurity Present (06/06/2023)   Hunger Vital Sign    Worried About Running Out of Food in the Last Year: Sometimes true    Ran Out of Food in the Last Year: Sometimes true  Transportation Needs: No Transportation Needs (06/06/2023)   PRAPARE - Administrator, Civil Service (Medical): No    Lack of Transportation (Non-Medical): No  Recent Concern: Transportation Needs - Unmet Transportation Needs (03/15/2023)   PRAPARE - Transportation    Lack of Transportation  (Medical): Yes    Lack of Transportation (Non-Medical): Yes  Physical Activity: Insufficiently Active (08/25/2022)   Exercise  Vital Sign    Days of Exercise per Week: 1 day    Minutes of Exercise per Session: 10 min  Stress: Stress Concern Present (08/25/2022)   [employer] of Occupational Health - Occupational Stress Questionnaire    Feeling of Stress : To some extent  Social Connections: Moderately Integrated (08/25/2022)   Social Connection and Isolation Panel [NHANES]    Frequency of Communication with Friends and Family: More than three times a week    Frequency of Social Gatherings with Friends and Family: Twice a week    Attends Religious Services: Never    Database administrator or Organizations: No    Attends Engineer, structural: 1 to 4 times per year    Marital Status: Married    Code Status: Full Code  Review of Systems: A 12 point ROS discussed and pertinent positives are indicated in the HPI above.  All other systems are negative.  Review of Systems  Constitutional:  Negative for chills and fever.  Respiratory:  Positive for shortness of breath. Negative for chest tightness.   Cardiovascular:  Negative for chest pain and leg swelling.  Gastrointestinal:  Negative for abdominal pain, diarrhea, nausea and vomiting.  Neurological:  Negative for dizziness and headaches.  Psychiatric/Behavioral:  Negative for confusion.     Vital Signs: BP 107/66   Pulse 78   Temp 98.1 F (36.7 C) (Oral)   Resp 16   Ht 4\' 11"  (1.499 m)   Wt 136 lb (61.7 kg)   SpO2 93%   BMI 27.47 kg/m     Physical Exam Vitals reviewed.  Constitutional:      General: She is not in acute distress. HENT:     Mouth/Throat:     Mouth: Mucous membranes are moist.  Cardiovascular:     Rate and Rhythm: Normal rate and regular rhythm.     Pulses: Normal pulses.     Heart sounds: Normal heart sounds.  Pulmonary:     Effort: Pulmonary effort is normal.     Breath sounds: Wheezing  present. No rhonchi or rales.  Abdominal:     Palpations: Abdomen is soft.     Tenderness: There is no abdominal tenderness.     Comments: Ostomy present  Musculoskeletal:     Right lower leg: No edema.     Left lower leg: No edema.  Skin:    General: Skin is warm and dry.  Neurological:     Mental Status: She is alert and oriented to person, place, and time.  Psychiatric:        Mood and Affect: Mood normal.        Behavior: Behavior normal.        Thought Content: Thought content normal.        Judgment: Judgment normal.       Imaging: CT CHEST ABDOMEN PELVIS W CONTRAST  Result Date: 05/31/2023 CLINICAL DATA:  History of rectal cancer, follow-up/restaging. * Tracking Code: BO * EXAM: CT CHEST, ABDOMEN, AND PELVIS WITH CONTRAST TECHNIQUE: Multidetector CT imaging of the chest, abdomen and pelvis was performed following the standard protocol during bolus administration of intravenous contrast. RADIATION DOSE REDUCTION: This exam was performed according to the departmental dose-optimization program which includes automated exposure control, adjustment of the mA and/or kV according to patient size and/or use of iterative reconstruction technique. CONTRAST:  OMNIPAQUE IOHEXOL 300 MG/ML  SOLN COMPARISON:  Multiple priors including most recent CT December 21, 2022 FINDINGS: CT CHEST FINDINGS Cardiovascular:  Right chest Port-A-Cath with tip near the superior cavoatrial junction. Normal caliber thoracic aorta. No central pulmonary embolus on this nondedicated study. Normal size heart. No significant pericardial effusion/thickening. Mediastinum/Nodes: No suspicious thyroid nodule. No pathologically enlarged mediastinal, hilar or axillary lymph nodes. Reflux versus retained contrast in the esophagus. Lungs/Pleura: Small bilateral pulmonary nodules, some of which were subtly evident on prior examination only in retrospect, some of which demonstrate degree of cavitation. For reference: -solid 4 mm  right middle lobe pulmonary nodule on image 77/3 previously 2 mm. -solid centrally cavitary pulmonary nodule in the left upper lobe measuring 6 mm on image 52/3 previously 3 mm.-partially cavitary subpleural left lower lobe pulmonary nodule now measuring 5 mm on image 74/3 previously 2-3 mm. No pleural effusion.  No pneumothorax. Musculoskeletal: No aggressive lytic or blastic lesion of bone. CT ABDOMEN PELVIS FINDINGS Hepatobiliary: New hypodense 16 mm lesion in the right lobe of the liver on image 51/4. Very subtle adjacent 5 mm lesion on image 51/2. Gallbladder is unremarkable. No biliary ductal dilation. Pancreas: No pancreatic ductal dilation or evidence of acute inflammation. Spleen: No splenomegaly or focal splenic lesion. Adrenals/Urinary Tract: Bilateral adrenal glands appear normal. No hydronephrosis. Left renal parenchymal atrophy/scarring. Multiple nonobstructive left renal calculi measure under 1 cm. Multiple small benign renal cysts are again seen bilaterally. Urinary bladder is unremarkable for degree of distension. Stomach/Bowel: Radiopaque enteric contrast material traverses the stomach. No pathologic dilation of small or large bowel. Large volume of formed stool in the colon. Left anterior abdominal wall end colostomy. 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 Similar small amount of perirectal/presacral soft tissue and fluid. Vascular/Lymphatic: Normal caliber abdominal aorta. Smooth IVC contours. Prominent retroperitoneal lymph nodes are similar prior for instance the previously indexed left periaortic lymph node measures 9 mm on image 64/2, unchanged. Reproductive: Uterus and bilateral adnexa are unremarkable. Other: No significant abdominopelvic free fluid. No discrete peritoneal or mental nodularity. Soft tissue nodularity in the posterior gluteal subcutaneous fat for instance on image 88/2 is unchanged. Musculoskeletal: Postradiation change in the sacrum. Hyperdense focus  in the S2 vertebral body is stable from prior studies and likely reflects a bone island. IMPRESSION: 1. New hypodense 16 mm lesion in the right lobe of the liver with very subtle adjacent 5 mm lesion, suspicious for metastatic disease. 2. Small bilateral pulmonary nodules, some of which were subtly evident on prior examination only in retrospect, some of which demonstrate degree of cavitation, suspicious for metastatic disease. 3. Prominent retroperitoneal lymph nodes are similar prior. 4. Prior low anterior resection with Hartmann's pouch formation, similar small amount of perirectal/presacral soft tissue and fluid, favored postsurgical/posttreatment change. Suggest continued attention on follow-up imaging. 5. Large volume of formed stool in the colon. 6. Nonobstructive left renal calculi measure under 1 cm. These results will be called to the ordering clinician or representative by the Radiologist Assistant, and communication documented in the PACS or [employer]. Electronically Signed   By: Maudry Mayhew M.D.   On: 05/31/2023 16:52    Labs:  CBC: Recent Labs    12/27/22 1331 01/22/23 1009 03/05/23 1000 06/05/23 0941  WBC 4.2 8.7 8.7 7.3  HGB 13.4 14.2 15.1* 13.7  HCT 39.2 41.5 44.1 40.3  PLT 120* 144* 153 144*    COAGS: No results for input(s): "INR", "APTT" in the last 8760 hours.  BMP: Recent Labs    12/27/22 1331 01/22/23 1009 03/05/23 1000 06/05/23 0941  NA 139 136 135 135  K 4.3 3.7 3.8  4.1  CL 103 102 101 104  CO2 29 24 25 25   GLUCOSE 124* 118* 153* 155*  BUN 16 9 14 16   CALCIUM 9.3 8.9 9.1 8.9  CREATININE 0.98 0.90 0.94 0.93  GFRNONAA >60 >60 >60 >60    LIVER FUNCTION TESTS: Recent Labs    12/27/22 1331 01/22/23 1009 03/05/23 1000 06/05/23 0941  BILITOT 0.5 0.4 0.5 0.3  AST 16 26 21 19   ALT 11 14 12 15   ALKPHOS 76 68 93 95  PROT 6.9 6.8 7.3 7.0  ALBUMIN 3.9 3.8 4.1 3.8    TUMOR MARKERS: No results for input(s): "AFPTM", "CEA", "CA199", "CHROMGRNA"  in the last 8760 hours.  Assessment and Plan:  April Luna is a 52 yo female with rectal cancer being seen today in relation to newly discovered liver mass concerning for metastatic disease. Patient has been referred to IR for image-guided liver biopsy. Case was approved by Dr Loreta Ave and has been reviewed with Dr Milford Cage. Patient presents today in her usual state of health and is NPO. Pre-procedural labs are pending.  Risks and benefits of image-guided liver biopsy was discussed with the patient and/or patient's family including, but not limited to bleeding, infection, damage to adjacent structures or low yield requiring additional tests.  All of the questions were answered and there is agreement to proceed.  Consent signed and in chart.    Thank you for this interesting consult.  I greatly enjoyed meeting April Luna and look forward to participating in their care.  A copy of this report was sent to the requesting provider on this date.  Electronically Signed: Kennieth Francois, PA-C 06/08/2023, 9:10 AM   I spent a total of  15 Minutes   in face to face in clinical consultation, greater than 50% of which was counseling/coordinating care for liver mass.

## 2023-06-08 NOTE — Procedures (Signed)
Vascular and Interventional Radiology Procedure Note  Patient: April Luna DOB: 29-Feb-1971 Medical Record Number: [id number] Note Date/Time: 06/08/23 9:47 AM   Performing Physician: Roanna Banning, MD Assistant(s): None  Diagnosis: Hx of rectal CA. New liver masses   Procedure: LIVER MASS BIOPSY  Anesthesia: Conscious Sedation Complications: None Estimated Blood Loss: Minimal Specimens: Sent for Pathology  Findings:  Successful Ultrasound-guided biopsy of liver mass. A total of 3 samples were obtained. Hemostasis of the tract was achieved using Gelfoam Slurry Embolization.  Plan: Bed rest for 2 hours.  See detailed procedure note with images in PACS. The patient tolerated the procedure well without incident or complication and was returned to Recovery in stable condition.    Roanna Banning, MD [facility] Interventional Radiology [facility]ists [facility] Radiology   Pager. [phone] Clinic. [phone]

## 2023-06-13 ENCOUNTER — Telehealth: Payer: Self-pay | Admitting: *Deleted

## 2023-06-13 NOTE — Telephone Encounter (Signed)
April Luna, will you schedule pt to get cathflo and inform her of appt please. She is a [employer] pt.

## 2023-06-13 NOTE — Telephone Encounter (Addendum)
Patient called reporting that her port did not work when it was accessed for her liver biopsy and she wants something done about it before it has to be used for [place] again (7/15?, but has an physician apt 6/24) She is asking what to do about it. Please advise

## 2023-06-18 ENCOUNTER — Inpatient Hospital Stay: Payer: 59

## 2023-06-18 ENCOUNTER — Encounter: Payer: Self-pay | Admitting: Oncology

## 2023-06-18 ENCOUNTER — Inpatient Hospital Stay (HOSPITAL_BASED_OUTPATIENT_CLINIC_OR_DEPARTMENT_OTHER): Payer: 59 | Admitting: Oncology

## 2023-06-18 VITALS — BP 100/62 | HR 69 | Temp 97.9°F | Resp 18 | Ht 59.0 in | Wt 137.4 lb

## 2023-06-18 DIAGNOSIS — T451X5A Adverse effect of antineoplastic and immunosuppressive drugs, initial encounter: Secondary | ICD-10-CM

## 2023-06-18 DIAGNOSIS — Z7189 Other specified counseling: Secondary | ICD-10-CM | POA: Diagnosis not present

## 2023-06-18 DIAGNOSIS — Z720 Tobacco use: Secondary | ICD-10-CM

## 2023-06-18 DIAGNOSIS — G620 Drug-induced polyneuropathy: Secondary | ICD-10-CM | POA: Diagnosis not present

## 2023-06-18 DIAGNOSIS — Z95828 Presence of other vascular implants and grafts: Secondary | ICD-10-CM | POA: Diagnosis not present

## 2023-06-18 DIAGNOSIS — C20 Malignant neoplasm of rectum: Secondary | ICD-10-CM

## 2023-06-18 DIAGNOSIS — Z79899 Other long term (current) drug therapy: Secondary | ICD-10-CM | POA: Diagnosis not present

## 2023-06-18 MED ORDER — PROCHLORPERAZINE MALEATE 10 MG PO TABS: 10.0000 mg | ORAL_TABLET | Freq: Four times a day (QID) | ORAL | 0 refills | Status: DC | PRN

## 2023-06-18 MED ORDER — ONDANSETRON HCL 8 MG PO TABS: 8.0000 mg | ORAL_TABLET | Freq: Three times a day (TID) | ORAL | 1 refills | Status: DC | PRN

## 2023-06-18 MED ORDER — LIDOCAINE-PRILOCAINE 2.5-2.5 % EX CREA: TOPICAL_CREAM | CUTANEOUS | 3 refills | Status: DC

## 2023-06-18 MED ORDER — LOPERAMIDE HCL 2 MG PO CAPS: 2.0000 mg | ORAL_CAPSULE | ORAL | 2 refills | Status: DC

## 2023-06-18 MED ORDER — ONDANSETRON HCL 8 MG PO TABS
8.0000 mg | ORAL_TABLET | Freq: Three times a day (TID) | ORAL | 1 refills | Status: DC | PRN
Start: 2023-06-18 — End: 2023-06-18

## 2023-06-18 NOTE — Progress Notes (Unsigned)
Wants to know results of biopsy and plan of action.speak with a Child psychotherapist patient states that she is currently homeless.patient states that her port is not working it was not working at biopsy, she states that she has felt a stinging and irritating feeling in the area where the port is located.

## 2023-06-18 NOTE — Assessment & Plan Note (Signed)
Grade 1, stabe 

## 2023-06-18 NOTE — Progress Notes (Signed)
DISCONTINUE ON PATHWAY REGIMEN - Colorectal     A cycle is every 14 days:     Oxaliplatin      Leucovorin      Fluorouracil      Fluorouracil   **Always confirm dose/schedule in your pharmacy ordering system**  REASON: Disease Progression PRIOR TREATMENT: ROS56: mFOLFOX6 q14 Days x 4 Months TREATMENT RESPONSE: Progressive Disease (PD)  START ON PATHWAY REGIMEN - Colorectal     A cycle is every 14 days:     Bevacizumab-xxxx      Oxaliplatin      Leucovorin      Fluorouracil      Fluorouracil   **Always confirm dose/schedule in your pharmacy ordering system**  Patient Characteristics: Distant Metastases, Nonsurgical Candidate, Non-KRAS G12C, RAS Mutation Positive/Unknown (BRAF V600 Wild-Type/Unknown), Standard Cytotoxic Therapy, First Line Standard Cytotoxic Therapy, Bevacizumab Eligible, PS = 0,1 Tumor Location: Rectal Therapeutic Status: Distant Metastases Microsatellite/Mismatch Repair Status: MSS/pMMR BRAF Mutation Status: Awaiting Test Results KRAS/NRAS Mutation Status: Awaiting Test Results Preferred Therapy Approach: Standard Cytotoxic Therapy Standard Cytotoxic Line of Therapy: First Line Standard Cytotoxic Therapy ECOG Performance Status: 1 Bevacizumab Eligibility: Eligible Intent of Therapy: Non-Curative / Palliative Intent, Discussed with Patient

## 2023-06-18 NOTE — Progress Notes (Signed)
DISCONTINUE ON PATHWAY REGIMEN - Colorectal     A cycle is every 14 days:     Bevacizumab-xxxx      Oxaliplatin      Leucovorin      Fluorouracil      Fluorouracil   **Always confirm dose/schedule in your pharmacy ordering system**  REASON: Disease Progression PRIOR TREATMENT: [id number]: mFOLFOX6 + Bevacizumab q14 Days TREATMENT RESPONSE: Stable Disease (SD)  START ON PATHWAY REGIMEN - Colorectal     A cycle is every 14 days:     Bevacizumab-xxxx      Irinotecan      Leucovorin      Fluorouracil      Fluorouracil   **Always confirm dose/schedule in your pharmacy ordering system**  Patient Characteristics: Distant Metastases, Nonsurgical Candidate, Non-KRAS G12C, RAS Mutation Positive/Unknown (BRAF V600 Wild-Type/Unknown), Standard Cytotoxic Therapy, First Line Standard Cytotoxic Therapy, Bevacizumab Eligible, PS = 0,1 Tumor Location: Rectal Therapeutic Status: Distant Metastases Microsatellite/Mismatch Repair Status: MSS/pMMR BRAF Mutation Status: Awaiting Test Results KRAS/NRAS Mutation Status: Awaiting Test Results Preferred Therapy Approach: Standard Cytotoxic Therapy Standard Cytotoxic Line of Therapy: First Line Standard Cytotoxic Therapy ECOG Performance Status: 1 Bevacizumab Eligibility: Eligible Intent of Therapy: Non-Curative / Palliative Intent, Discussed with Patient

## 2023-06-18 NOTE — Assessment & Plan Note (Signed)
History of Stage III. pT3 pN1b cM0, s/p APR. Currently on adjuvant chemotherapy. S/p FOLFOX x 4 cycles S/p concurrent chemotherapy [Xeloda 1300 mg BID] with RT, and then another 4 cycles of adjuvant FOLFOX [dose reduced oxaliplatin and omit 5-FU bolus CT imaging indicates disease progression.  Ultrasound-guided liver biopsy pathology showed metastatic carcinoma consistent with history of rectal adenocarcinoma.  CEA is elevated. Discussed with patient about stage IV current rectal adenocarcinoma I recommend checking [facility] N[facility] from medical studies. Recommend systemic chemotherapy FOLFIRI Plus bevacizumab. Rationale and potential side effects were reviewed with the patient.  She agrees with the plan. Anticipate to start palliative chemotherapy in 1 week.

## 2023-06-18 NOTE — Progress Notes (Unsigned)
Hematology/Oncology Progress note [phone] [phone]) [phone]      CHIEF COMPLAINTS/REASON FOR VISIT:  Follow-up for rectal cancer treatments.  ASSESSMENT & PLAN:   Cancer Staging  Rectal cancer [facility]) Staging form: Colon and Rectum, AJCC 8th Edition - Pathologic stage from 05/03/2022: Stage IIIB (pT3, pN1b, cM0) - Signed by [name], MD on 05/03/2022 - Pathologic stage from 06/18/2023: Stage IVA (rpTX, pN0, pM1a) - Signed by [name], MD on 06/18/2023   Rectal cancer [facility]) History of Stage III. pT3 pN1b cM0, s/p APR. Currently on adjuvant chemotherapy. S/p FOLFOX x 4 cycles S/p concurrent chemotherapy [Xeloda 1300 mg BID] with RT, and then another 4 cycles of adjuvant FOLFOX [dose reduced oxaliplatin and omit 5-FU bolus CT imaging indicates disease progression.  Ultrasound-guided liver biopsy pathology showed metastatic carcinoma consistent with history of rectal adenocarcinoma.  CEA is elevated. Discussed with patient about stage IV current rectal adenocarcinoma I recommend checking [facility] NGS from medical studies. Recommend systemic chemotherapy FOLFIRI Plus bevacizumab. Rationale and potential side effects were reviewed with the patient.  She agrees with the plan. Anticipate to start palliative chemotherapy in 1 week.    Tobacco use Recently quitted.  Encouraged her cessation effort.    Chemotherapy-induced neuropathy (HCC) Grade 1, stabe  Orders Placed This Encounter  Procedures   Consent Attestation for Oncology Treatment    Order Specific Question:   The patient is informed of risks, benefits, side-effects of the prescribed oncology treatment. Potential short term and long term side effects and response rates discussed. After a long discussion, the patient made informed decision to proceed.    Answer:   Yes   IR CV Line Injection    Standing Status:   Future    Standing Expiration Date:   06/17/2024    Order Specific Question:   Reason for Exam (SYMPTOM   OR DIAGNOSIS REQUIRED)    Answer:   Non function port    Order Specific Question:   Is the patient pregnant?    Answer:   No    Order Specific Question:   Preferred Imaging Location?    Answer:   [facility]   Protein, urine, random    Standing Status:   Future    Standing Expiration Date:   06/24/2024   CBC with Differential (Cancer Center Only)    Standing Status:   Future    Standing Expiration Date:   06/24/2024   CMP (Cancer Center only)    Standing Status:   Future    Standing Expiration Date:   06/24/2024   Ambulatory referral to Social Work    Referral Priority:   Routine    Referral Type:   Consultation    Referral Reason:   Specialty Services Required    Number of Visits Requested:   1   PHYSICIAN COMMUNICATION ORDER    UA Protein, Dipstick required prior to every 3rd cycle of bevacizumab.    Follow-up 1 week for start of chemo.  All questions were answered. The patient knows to call the clinic with any problems, questions or concerns.  [name], MD, PhD [facility] Hematology Oncology 06/18/2023      HISTORY OF PRESENTING ILLNESS:   April Luna is a  52 y.o.  female presents for treatment of rectal cancer Oncology History  Rectal cancer (HCC)  02/26/2022 Imaging   MRI PELVIS WITHOUT CONTRAST- By imaging, rectal cancer stage:  T1/T2, N0, Mx    03/02/2022 Imaging   CT CHEST AND ABDOMEN  WITH CONTRAST 1. No convincing evidence of metastatic disease within the chest or abdomen. 2. Atrophic left kidney with multifocal renal scarring and cortical calcifications as well as nonobstructive renal stones measuring up to 5 mm. 3. Prominent left-sided predominant retroperitoneal lymph nodes measuring up to 8 mm near the level of the renal hilum, overall decreased in size dating back to CT September 19, 2018 and favored reactive related to left renal inflammation. 4.  Aortic Atherosclerosis (ICD10-I70.0).   03/27/2022 Genetic Testing    [facility] CustomNext+RNA cancer  panel found no pathogenic mutations.    04/21/2022 Initial Diagnosis   Rectal cancer - baseline CEA 3.6 -02/09/2022, patient had colonoscopy which showed renal mass 10 cm from anal verge.  5 mm polyp in ascending colon.  Removed and retrieved. Pathology showed rectal adenocarcinoma.  The polyp in the ascending colon is a tubular adenoma.  MRI showed cT1/T2N0 disease  -04/21/2022, patient underwent robotic assisted ultralow anterior resection. Pathology showed moderately differentiated adenocarcinoma, 4.5 cm in maximal extent, with focal extension through muscularis propria into perirectal soft tissue.  3 lymph nodes positive for metastatic carcinoma.  Negative margin.  pT3 pN1b, MSI stable.   05/03/2022 Cancer Staging   Staging form: Colon and Rectum, AJCC 8th Edition - Pathologic stage from 05/03/2022: Stage IIIB (pT3, pN1b, cM0) - Signed by [name], MD on 05/03/2022 Stage prefix: Initial diagnosis   05/11/2022 Miscellaneous   Medi port placed by Dr.White   05/26/2022 -  Chemotherapy   FOLFOX Q2 weeks x 4   05/26/2022 - 07/09/2022 Chemotherapy   Patient is on Treatment Plan : COLORECTAL FOLFOX q14d x 8 cycles     05/26/2022 - 12/13/2022 Chemotherapy   Patient is on Treatment Plan : COLORECTAL FOLFOX q14d x 4 months     07/27/2022 -  Chemotherapy   Concurrent chemotherapy- Xeloda  1300mg  BID and radiation.    07/27/2022 - 09/12/2022 Radiation Therapy    concurrent chemotherapy [Xeloda 1300 mg BID] with RT    09/20/2022 Imaging   CT Angiogram chest PET protocol There is no evidence of pulmonary artery embolism. There is no evidence of thoracic aortic dissection.   Small linear patchy alveolar infiltrate is seen in medial segment of right middle lobe suggesting atelectasis/pneumonia.     09/28/2022 - 09/30/2022 Hospital Admission   Admission due to acute respiratory failure with hypoxia, COPD exacerbation   09/28/2022 Imaging   CT angio chest PE protocol 1. Negative for acute PE or thoracic  aortic dissection. 2. New ground-glass infiltrates anteriorly in bilateral upper lobes,may represent atypical edema, infectious or inflammatory process. 3.  Aortic Atherosclerosis     12/22/2022 Imaging   CT chest abdomen pelvis w contrast  Stable exam. No evidence of recurrent or metastatic carcinoma within the chest, abdomen, or pelvis.   Left nephrolithiasis and renal parenchymal atrophy. No evidence of ureteral calculi or hydronephrosis.   Aortic Atherosclerosis   05/31/2023 Imaging   CT chest abdomen pelvis with contrast showed 1. New hypodense 16 mm lesion in the right lobe of the liver with very subtle adjacent 5 mm lesion, suspicious for metastatic disease. 2. Small bilateral pulmonary nodules, some of which were subtly evident on prior examination only in retrospect, some of which demonstrate degree of cavitation, suspicious for metastatic disease. 3. Prominent retroperitoneal lymph nodes are similar prior. 4. Prior low anterior resection with Hartmann's pouch formation,similar small amount of perirectal/presacral soft tissue and fluid,favored postsurgical/posttreatment change. Suggest continued attention on follow-up imaging. 5. Large volume of formed stool  in the colon. 6. Nonobstructive left renal calculi measure under 1 cm.     06/08/2023 Procedure   Ultrasound-guided liver biopsy showed Pathology showed metastatic carcinoma, morphology consistent with patient's clinical history of rectal adenocarcinoma.   06/18/2023 Cancer Staging   Staging form: Colon and Rectum, AJCC 8th Edition - Pathologic stage from 06/18/2023: Stage IVA (rpTX, pN0, pM1a) - Signed by [name], MD on 06/18/2023 Stage prefix: Recurrence Total positive nodes: 0   06/25/2023 -  Chemotherapy   Patient is on Treatment Plan : COLORECTAL FOLFIRI + Bevacizumab q14d     06/25/2023 - 06/25/2023 Chemotherapy   Patient is on Treatment Plan : COLORECTAL FOLFOX + Bevacizumab q14d     Patient has bipolar and  schizophrenia..  Patient is married and lives at home with her husband and son.  INTERVAL HISTORY SATYA BOHALL is a 52 y.o. female who has above history reviewed by me today presents for follow up visit for rectal cancer.  She feels well today.  She has gained weight since April 2024. Denies any melena or blood in the stool. Stable neuropathy symptoms. S/p liver biopsy. She presents to discuss results.  She has housing issue due to previous history of eviction.  she does not live with her husband currently. She lives with some family members.   Review of Systems  Constitutional:  Positive for fatigue. Negative for appetite change, chills and fever.  HENT:   Negative for hearing loss and voice change.   Eyes:  Negative for eye problems.  Respiratory:  Negative for chest tightness, cough and shortness of breath.   Cardiovascular:  Negative for chest pain.  Gastrointestinal:  Negative for abdominal distention, abdominal pain and blood in stool.       + colostomy  Endocrine: Negative for hot flashes.  Genitourinary:  Negative for difficulty urinating and frequency.   Musculoskeletal:  Negative for arthralgias.  Skin:  Negative for itching and rash.  Neurological:  Positive for numbness. Negative for extremity weakness.  Hematological:  Negative for adenopathy.  Psychiatric/Behavioral:  Negative for confusion.     MEDICAL HISTORY:  Past Medical History:  Diagnosis Date   Allergy    pollen   Anxiety    Arthritis    right hip   Bipolar 1 disorder (HCC)    Cancer (HCC)    rectal   Chemotherapy-induced neuropathy (HCC) 10/24/2022   Chronic kidney disease    COPD (chronic obstructive pulmonary disease) (HCC)    Depression    Family history of adverse reaction to anesthesia    grand father had a stroke during anesthesia   Family history of breast cancer    Family history of colon cancer    Family history of uterine cancer    GERD (gastroesophageal reflux disease)     History of kidney stones    Hyperlipidemia    Hypertension    Hypothyroidism    Panic attack    Pneumonia    Psoriasis    Sleep apnea 08/11/2021   No CPAP   Type 2 diabetes mellitus with microalbuminuria, without long-term current use of insulin (HCC) 06/24/2019    SURGICAL HISTORY: Past Surgical History:  Procedure Laterality Date   BREAST BIOPSY Left 12/14/2021   [place] bx, venus marker, path pending   CESAREAN SECTION     COLONOSCOPY WITH PROPOFOL N/A 02/09/2022   Procedure: COLONOSCOPY WITH PROPOFOL;  Surgeon: Toney Reil, MD;  Location: [facility];  Service: Endoscopy;  Laterality: N/A;  sleep apnea  CYSTOSCOPY W/ RETROGRADES Left 11/08/2018   Procedure: CYSTOSCOPY WITH RETROGRADE PYELOGRAM;  Surgeon: Sondra Come, MD;  Location: [facility];  Service: Urology;  Laterality: Left;   CYSTOSCOPY/URETEROSCOPY/HOLMIUM LASER/STENT PLACEMENT Left 11/08/2018   Procedure: CYSTOSCOPY/URETEROSCOPY/HOLMIUM LASER/STENT PLACEMENT;  Surgeon: Sondra Come, MD;  Location: [facility];  Service: Urology;  Laterality: Left;   IR IMAGING GUIDED PORT INSERTION  05/11/2022   MOUTH SURGERY     wisdom teeth extraction   MOUTH SURGERY     teeth removal   POLYPECTOMY N/A 02/09/2022   Procedure: POLYPECTOMY;  Surgeon: Toney Reil, MD;  Location: [facility];  Service: Endoscopy;  Laterality: N/A;   XI ROBOTIC ASSISTED LOWER ANTERIOR RESECTION N/A 04/21/2022   Procedure: XI ROBOTIC ASSISTED LOWER ANTERIOR RESECTION WITH COLOSTOMY, BILATERAL TAP BLOCK, ASSESSMENT OF TISSUE PERFUSSION WITH FIREFLY INJECTION;  Surgeon: Andria Meuse, MD;  Location: [facility];  Service: General;  Laterality: N/A;    SOCIAL HISTORY: Social History   Socioeconomic History   Marital status: Married    Spouse name: Lesle Chris   Number of children: 1   Years of education: 12   Highest education level: High school graduate  Occupational History   Occupation: unemployed     Comment: disabled  Tobacco Use   Smoking status: Some Days    Packs/day: 0.25    Years: 34.00    Additional pack years: 0.00    Total pack years: 8.50    Types: Cigarettes    Start date: 05/13/1985    Passive exposure: Current   Smokeless tobacco: Never   Tobacco comments:    5-6 cigarettes weekly- 11/07/2022  Vaping Use   Vaping Use: Former   Start date: 05/25/2018   Quit date: 09/24/2018  Substance and Sexual Activity   Alcohol use: Not Currently    Alcohol/week: 4.0 standard drinks of alcohol    Types: 4 Glasses of wine per week    Comment: quit ETOH in Feb. 2023   Drug use: Not Currently    Types: Marijuana    Comment: 2 days ago   Sexual activity: Not Currently    Birth control/protection: Post-menopausal  Other Topics Concern   Not on file  Social History Narrative   Lives with husband and son    Social Determinants of Health   Financial Resource Strain: High Risk (08/25/2022)   Overall Financial Resource Strain (CARDIA)    Difficulty of Paying Living Expenses: Hard  Food Insecurity: Food Insecurity Present (06/06/2023)   Hunger Vital Sign    Worried About Running Out of Food in the Last Year: Sometimes true    Ran Out of Food in the Last Year: Sometimes true  Transportation Needs: No Transportation Needs (06/06/2023)   PRAPARE - Administrator, Civil Service (Medical): No    Lack of Transportation (Non-Medical): No  Recent Concern: Transportation Needs - Unmet Transportation Needs (03/15/2023)   PRAPARE - Transportation    Lack of Transportation (Medical): Yes    Lack of Transportation (Non-Medical): Yes  Physical Activity: Insufficiently Active (08/25/2022)   Exercise Vital Sign    Days of Exercise per Week: 1 day    Minutes of Exercise per Session: 10 min  Stress: Stress Concern Present (08/25/2022)   [employer] of Occupational Health - Occupational Stress Questionnaire    Feeling of Stress : To some extent  Social Connections: Moderately  Integrated (08/25/2022)   Social Connection and Isolation Panel [NHANES]    Frequency of Communication with  Friends and Family: More than three times a week    Frequency of Social Gatherings with Friends and Family: Twice a week    Attends Religious Services: Never    Database administrator or Organizations: No    Attends Engineer, structural: 1 to 4 times per year    Marital Status: Married  Catering manager Violence: Not At Risk (09/20/2022)   Humiliation, Afraid, Rape, and Kick questionnaire    Fear of Current or Ex-Partner: No    Emotionally Abused: No    Physically Abused: No    Sexually Abused: No    FAMILY HISTORY: Family History  Problem Relation Age of Onset   Depression Mother    Anxiety disorder Mother    Diabetes Mother    Hypertension Mother    Hyperlipidemia Mother    Cancer Mother    Uterine cancer Mother 47   Cervical cancer Mother 46   Colon cancer Father    Depression Brother    Anxiety disorder Brother    Cancer Maternal Aunt        unk types   Diabetes Mellitus II Maternal Grandmother    Hypercholesterolemia Maternal Grandmother    Breast cancer Maternal Grandmother    Cancer Paternal Grandmother    Diabetes Paternal Grandmother    Melanoma Paternal Grandmother    Stomach cancer Paternal Grandmother     ALLERGIES:  is allergic to metformin and related, nsaids, perphenazine, sulfa antibiotics, abilify [aripiprazole], and penicillins.  MEDICATIONS:  Current Outpatient Medications  Medication Sig Dispense Refill   ACCU-CHEK GUIDE test strip USE UP TO FOUR TIMES DAILY AS DIRECTED. 100 each 5   acetaminophen (TYLENOL) 325 MG tablet Take 650 mg by mouth every 6 (six) hours as needed for headache (pain).     albuterol (PROVENTIL) (2.5 MG/3ML) 0.083% nebulizer solution Take 3 mLs (2.5 mg total) by nebulization every 6 (six) hours as needed for shortness of breath or wheezing. 75 mL 0   albuterol (VENTOLIN HFA) 108 (90 Base) MCG/ACT inhaler Inhale 2  puffs into the lungs every 6 (six) hours as needed for wheezing or shortness of breath. 1 each 5   amantadine (SYMMETREL) 100 MG capsule Take 100 mg by mouth 2 (two) times daily.     blood glucose meter kit and supplies Dispense based on patient and insurance preference. Use up to four times daily as directed. (FOR ICD-10 E10.9, E11.9). 1 each 0   Budeson-Glycopyrrol-Formoterol (BREZTRI AEROSPHERE) 160-9-4.8 MCG/ACT AERO Inhale 2 puffs into the lungs in the morning and at bedtime. 5.9 g 11   buPROPion (WELLBUTRIN XL) 300 MG 24 hr tablet Take 300 mg by mouth daily.     Cholecalciferol (VITAMIN D-3) 125 MCG (5000 UT) TABS Take 5,000 Units by mouth daily. 30 tablet 1   dapagliflozin propanediol (FARXIGA) 10 MG TABS tablet Take 1 tablet (10 mg total) by mouth daily before breakfast. 30 tablet 0   diltiazem (CARDIZEM CD) 120 MG 24 hr capsule Take 1 capsule (120 mg total) by mouth daily. 90 capsule 1   docusate sodium (COLACE) 100 MG capsule TAKE 1 CAPSULE BY MOUTH TWICE DAILY 60 capsule 1   fluconazole (DIFLUCAN) 150 MG tablet Take 1 tablet (150 mg total) by mouth every 3 (three) days as needed (for vaginal itching/yeast infection sx). 2 tablet 0   gabapentin (NEURONTIN) 300 MG capsule Take 300 mg by mouth 2 (two) times daily.     hydrocortisone cream 0.5 % Apply 1 Application topically 2 (two)  times daily as needed for itching. 30 g 1   Hydrocortisone, Perianal, 1 % CREA Apply topically.     hydrOXYzine (VISTARIL) 50 MG capsule Take 50 mg by mouth 3 (three) times daily as needed for anxiety.     icosapent Ethyl (VASCEPA) 1 g capsule TAKE 2 CAPSULES BY MOUTH 2 TIMES DAILY 120 capsule 3   Insulin Pen Needle 32G X 4 MM MISC 1 each by Does not apply route daily at 12 noon. 100 each 0   ipratropium-albuterol (DUONEB) 0.5-2.5 (3) MG/3ML SOLN Take 3 mLs by nebulization every 6 (six) hours as needed. 360 mL 11   levothyroxine (SYNTHROID) 75 MCG tablet TAKE 1 TABLET BY MOUTH DAILY BEFORE BREAKFAST 90 tablet 2    loperamide (IMODIUM) 2 MG capsule Take 1 capsule (2 mg total) by mouth See admin instructions. Initial: 4 mg,the 2 mg every 2 hours (4 mg every 4 hours at night)  maximum: 16 mg/day 60 capsule 2   loratadine (CLARITIN) 10 MG tablet Take 10 mg by mouth every morning.     losartan (COZAAR) 25 MG tablet Take 1 tablet (25 mg total) by mouth at bedtime. 30 tablet 3   NON FORMULARY Pt uses a cpap nightly     nystatin cream (MYCOSTATIN) Apply 1 Application topically 2 (two) times daily. 30 g 0   oxybutynin (DITROPAN-XL) 10 MG 24 hr tablet Take 1 tablet (10 mg total) by mouth daily. 30 tablet 11   paliperidone (INVEGA SUSTENNA) 234 MG/1.5ML SUSY injection Inject 234 mg into the muscle every 30 (thirty) days. On or about the 14th of each month     pantoprazole (PROTONIX) 40 MG tablet TAKE 1 TABLET BY MOUTH DAILY 90 tablet 2   Pediatric Multivit-Minerals-C (CHEWABLES MULTIVITAMIN PO) Take 1 tablet by mouth daily.     pioglitazone (ACTOS) 15 MG tablet Take 1 tablet (15 mg total) by mouth daily. 90 tablet 1   prochlorperazine (COMPAZINE) 10 MG tablet Take 1 tablet (10 mg total) by mouth every 6 (six) hours as needed for nausea or vomiting. 30 tablet 0   rosuvastatin (CRESTOR) 40 MG tablet TAKE 1 TABLET BY MOUTH EVERY MORNING 30 tablet 3   sertraline (ZOLOFT) 100 MG tablet Take 200 mg by mouth every morning.     Spacer/Aero-Holding Chambers (AEROCHAMBER MV) inhaler Use as instructed 1 each 0   lidocaine-prilocaine (EMLA) cream Apply to affected area once 30 g 3   ondansetron (ZOFRAN) 8 MG tablet Take 1 tablet (8 mg total) by mouth every 8 (eight) hours as needed for nausea, vomiting or refractory nausea / vomiting. Start on the third day after chemotherapy. 30 tablet 1   No current facility-administered medications for this visit.     PHYSICAL EXAMINATION: ECOG PERFORMANCE STATUS: 0 - Asymptomatic Vitals:   06/18/23 1327 06/18/23 1332  BP: 95/65 100/62  Pulse: 89 69  Resp:    Temp:    SpO2:       Filed Weights   06/18/23 1323  Weight: 137 lb 6.4 oz (62.3 kg)     Physical Exam Constitutional:      General: She is not in acute distress. HENT:     Head: Normocephalic and atraumatic.  Eyes:     General: No scleral icterus. Cardiovascular:     Rate and Rhythm: Normal rate and regular rhythm.  Pulmonary:     Effort: Pulmonary effort is normal. No respiratory distress.     Breath sounds: No wheezing.     Comments: Decreased  breath sound bilaterally Abdominal:     General: Bowel sounds are normal. There is no distension.     Palpations: Abdomen is soft.     Comments: +colostomy   Musculoskeletal:        General: No deformity. Normal range of motion.     Cervical back: Normal range of motion and neck supple.  Skin:    General: Skin is warm and dry.     Findings: No erythema or rash.  Neurological:     Mental Status: She is alert and oriented to person, place, and time. Mental status is at baseline.     Cranial Nerves: No cranial nerve deficit.     Coordination: Coordination normal.  Psychiatric:        Mood and Affect: Mood normal.     LABORATORY DATA:  I have reviewed the data as listed    Latest Ref Rng & Units 06/08/2023    9:13 AM 06/05/2023    9:41 AM 03/05/2023   10:00 AM  CBC  WBC 4.0 - 10.5 K/uL 8.2  7.3  8.7   Hemoglobin 12.0 - 15.0 g/dL 16.1  09.6  04.5   Hematocrit 36.0 - 46.0 % 40.7  40.3  44.1   Platelets 150 - 400 K/uL 143  144  153       Latest Ref Rng & Units 06/08/2023    9:13 AM 06/05/2023    9:41 AM 03/05/2023   10:00 AM  CMP  Glucose 70 - 99 mg/dL 409  811  914   BUN 6 - 20 mg/dL 16  16  14    Creatinine 0.44 - 1.00 mg/dL 7.82  9.56  2.13   Sodium 135 - 145 mmol/L 140  135  135   Potassium 3.5 - 5.1 mmol/L 4.0  4.1  3.8   Chloride 98 - 111 mmol/L 104  104  101   CO2 22 - 32 mmol/L 26  25  25    Calcium 8.9 - 10.3 mg/dL 8.8  8.9  9.1   Total Protein 6.5 - 8.1 g/dL 7.0  7.0  7.3   Total Bilirubin 0.3 - 1.2 mg/dL 0.4  0.3  0.5    Alkaline Phos 38 - 126 U/L 95  95  93   AST 15 - 41 U/L 23  19  21    ALT 0 - 44 U/L 19  15  12          RADIOGRAPHIC STUDIES: I have personally reviewed the radiological images as listed and agreed with the findings in the report. US BIOPSY (LIVER)  Result Date: 06/08/2023 INDICATION: liver mass Briefly, 52 year old female with a history of rectal cancer with new liver masses. EXAM: ULTRASOUND GUIDED LIVER MASS BIOPSY COMPARISON:  CT CAP, 05/31/2023. MEDICATIONS: 10 mL lidocaine 1% ANESTHESIA/SEDATION: Moderate (conscious) sedation was employed during this procedure. A total of Versed 1 mg and Fentanyl 50 mcg was administered intravenously. Moderate Sedation Time: 24 minutes. The patient's level of consciousness and vital signs were monitored continuously by radiology nursing throughout the procedure under my direct supervision. COMPLICATIONS: None immediate. PROCEDURE: Informed written consent was obtained from the patient and/or patient's representative after a discussion of the risks, benefits and alternatives to treatment. The patient understands and consents the procedure. A timeout was performed prior to the initiation of the procedure. Ultrasound scanning was performed of the right upper abdominal quadrant demonstrates small RIGHT hepatic lobe masses. The RIGHT hepatic lobe mass was selected for biopsy and the procedure was planned.  The right upper abdominal quadrant was prepped and draped in the usual sterile fashion. The overlying soft tissues were anesthetized with 1% lidocaine with epinephrine. A 17 gauge, 6.8 cm co-axial needle was advanced into a peripheral aspect of the lesion. This was followed by 4 core biopsies with an 18 gauge core device under direct ultrasound guidance. The coaxial needle tract was embolized with a small amount of Gel-Foam slurry and superficial hemostasis was obtained with manual compression. Post procedural scanning was negative for definitive area of hemorrhage or  additional complication. A dressing was placed. The patient tolerated the procedure well without immediate post procedural complication. IMPRESSION: Successful core needle biopsy of a RIGHT hepatic lobe mass. Roanna Banning, MD [facility] Radiology Electronically Signed   By: Roanna Banning M.D.   On: 06/08/2023 11:01   CT CHEST ABDOMEN PELVIS W CONTRAST  Result Date: 05/31/2023 CLINICAL DATA:  History of rectal cancer, follow-up/restaging. * Tracking Code: BO * EXAM: CT CHEST, ABDOMEN, AND PELVIS WITH CONTRAST TECHNIQUE: Multidetector CT imaging of the chest, abdomen and pelvis was performed following the standard protocol during bolus administration of intravenous contrast. RADIATION DOSE REDUCTION: This exam was performed according to the departmental dose-optimization program which includes automated exposure control, adjustment of the mA and/or kV according to patient size and/or use of iterative reconstruction technique. CONTRAST:  OMNIPAQUE IOHEXOL 300 MG/ML  SOLN COMPARISON:  Multiple priors including most recent CT December 21, 2022 FINDINGS: CT CHEST FINDINGS Cardiovascular: Right chest Port-A-Cath with tip near the superior cavoatrial junction. Normal caliber thoracic aorta. No central pulmonary embolus on this nondedicated study. Normal size heart. No significant pericardial effusion/thickening. Mediastinum/Nodes: No suspicious thyroid nodule. No pathologically enlarged mediastinal, hilar or axillary lymph nodes. Reflux versus retained contrast in the esophagus. Lungs/Pleura: Small bilateral pulmonary nodules, some of which were subtly evident on prior examination only in retrospect, some of which demonstrate degree of cavitation. For reference: -solid 4 mm right middle lobe pulmonary nodule on image 77/3 previously 2 mm. -solid centrally cavitary pulmonary nodule in the left upper lobe measuring 6 mm on image 52/3 previously 3 mm.-partially cavitary  subpleural left lower lobe pulmonary nodule now measuring 5 mm on image 74/3 previously 2-3 mm. No pleural effusion.  No pneumothorax. Musculoskeletal: No aggressive lytic or blastic lesion of bone. CT ABDOMEN PELVIS FINDINGS Hepatobiliary: New hypodense 16 mm lesion in the right lobe of the liver on image 51/4. Very subtle adjacent 5 mm lesion on image 51/2. Gallbladder is unremarkable. No biliary ductal dilation. Pancreas: No pancreatic ductal dilation or evidence of acute inflammation. Spleen: No splenomegaly or focal splenic lesion. Adrenals/Urinary Tract: Bilateral adrenal glands appear normal. No hydronephrosis. Left renal parenchymal atrophy/scarring. Multiple nonobstructive left renal calculi measure under 1 cm. Multiple small benign renal cysts are again seen bilaterally. Urinary bladder is unremarkable for degree of distension. Stomach/Bowel: Radiopaque enteric contrast material traverses the stomach. No pathologic dilation of small or large bowel. Large volume of formed stool in the colon. Left anterior abdominal wall end colostomy. 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 Similar small amount of perirectal/presacral soft tissue and fluid. Vascular/Lymphatic: Normal caliber abdominal aorta. Smooth IVC contours. Prominent retroperitoneal lymph nodes are similar prior for instance the previously indexed left periaortic lymph node measures 9 mm on image 64/2, unchanged. Reproductive: Uterus and bilateral adnexa are unremarkable. Other: No significant abdominopelvic free fluid. No discrete peritoneal or mental nodularity. Soft tissue nodularity in the posterior gluteal subcutaneous fat for instance on  image 88/2 is unchanged. Musculoskeletal: Postradiation change in the sacrum. Hyperdense focus in the S2 vertebral body is stable from prior studies and likely reflects a bone island. IMPRESSION: 1. New hypodense 16 mm lesion in the right lobe of the liver with very subtle adjacent 5 mm  lesion, suspicious for metastatic disease. 2. Small bilateral pulmonary nodules, some of which were subtly evident on prior examination only in retrospect, some of which demonstrate degree of cavitation, suspicious for metastatic disease. 3. Prominent retroperitoneal lymph nodes are similar prior. 4. Prior low anterior resection with Hartmann's pouch formation, similar small amount of perirectal/presacral soft tissue and fluid, favored postsurgical/posttreatment change. Suggest continued attention on follow-up imaging. 5. Large volume of formed stool in the colon. 6. Nonobstructive left renal calculi measure under 1 cm. These results will be called to the ordering clinician or representative by the Radiologist Assistant, and communication documented in the PACS or [employer]. Electronically Signed   By: Maudry Mayhew M.D.   On: 05/31/2023 16:52

## 2023-06-18 NOTE — Assessment & Plan Note (Signed)
Recently quitted.  Encouraged her cessation effort.   

## 2023-06-19 ENCOUNTER — Inpatient Hospital Stay: Payer: 59 | Admitting: Licensed Clinical Social Worker

## 2023-06-19 ENCOUNTER — Other Ambulatory Visit: Payer: Self-pay

## 2023-06-19 ENCOUNTER — Other Ambulatory Visit: Payer: Self-pay | Admitting: Oncology

## 2023-06-19 ENCOUNTER — Encounter: Payer: Self-pay | Admitting: Nurse Practitioner

## 2023-06-19 ENCOUNTER — Other Ambulatory Visit: Payer: Self-pay | Admitting: Family Medicine

## 2023-06-19 ENCOUNTER — Ambulatory Visit: Payer: 59 | Admitting: Radiology

## 2023-06-19 ENCOUNTER — Telehealth: Payer: Self-pay

## 2023-06-19 ENCOUNTER — Encounter: Payer: Self-pay | Admitting: Oncology

## 2023-06-19 ENCOUNTER — Ambulatory Visit: Payer: 59 | Attending: Student in an Organized Health Care Education/Training Program

## 2023-06-19 ENCOUNTER — Telehealth: Payer: Self-pay | Admitting: *Deleted

## 2023-06-19 DIAGNOSIS — R0602 Shortness of breath: Secondary | ICD-10-CM

## 2023-06-19 DIAGNOSIS — E1129 Type 2 diabetes mellitus with other diabetic kidney complication: Secondary | ICD-10-CM

## 2023-06-19 DIAGNOSIS — F17210 Nicotine dependence, cigarettes, uncomplicated: Secondary | ICD-10-CM

## 2023-06-19 MED ORDER — ALBUTEROL SULFATE (2.5 MG/3ML) 0.083% IN NEBU
2.5000 mg | INHALATION_SOLUTION | Freq: Once | RESPIRATORY_TRACT | Status: AC
  Filled 2023-06-19: qty 3

## 2023-06-19 MED ORDER — PROMETHAZINE HCL 12.5 MG PO TABS: 12.5000 mg | ORAL_TABLET | Freq: Four times a day (QID) | ORAL | 0 refills | Status: AC | PRN

## 2023-06-19 MED ORDER — DAPAGLIFLOZIN PROPANEDIOL 10 MG PO TABS
10.0000 mg | ORAL_TABLET | Freq: Every day | ORAL | 1 refills | Status: DC
Start: 2023-06-19 — End: 2023-12-04

## 2023-06-19 NOTE — Telephone Encounter (Signed)
Pharmacy called stating that patient has a Perphenazine allergy and it is a close cousin to the Prochlorperazine that you ordered today and they are asking if you want to proceed or not Please advise

## 2023-06-19 NOTE — Progress Notes (Signed)
[facility] Clinical Social Work  Initial Assessment   April Luna is a 52 y.o. year old female contacted by phone. Clinical Social Work was referred by medical provider for assessment of psychosocial needs.   SDOH (Social Determinants of Health) assessments performed: Yes SDOH Interventions    Flowsheet Row Care Coordination from 06/06/2023 in [facility] Care Coordination Office Visit from 06/04/2023 in [facility] Appointment from 03/15/2023 in [facility] Appointment from 03/09/2023 in [facility] Appointment from 01/22/2023 in [facility] Appointment from 12/21/2022 in [facility]  SDOH Interventions        Food Insecurity Interventions Other (Comment)  [Not eligible for FNS due to husbands wages and disability for both patient and son] -- -- -- -- --  Housing Interventions -- AMB Referral -- -- -- --  Transportation Interventions --  [Uses transportation from [facility] and [employer] staff assists with errands] -- Physiological scientist ([facility] Only) CCAR Zenaida Niece ([facility] Only) CCAR Zenaida Niece ([facility] Only) CCAR Zenaida Niece ([facility] Only)  Utilities Interventions Intervention Not Indicated -- -- -- -- --       SDOH Screenings   Food Insecurity: Food Insecurity Present (06/06/2023)  Housing: High Risk (06/04/2023)  Transportation Needs: No Transportation Needs (06/06/2023)  Recent Concern: Transportation Needs - Unmet Transportation Needs (03/15/2023)  Utilities: Not At Risk (06/06/2023)  Alcohol Screen: Low Risk  (12/02/2021)  Depression (PHQ2-9): Low Risk  (06/04/2023)  Financial Resource Strain: High Risk (08/25/2022)  Physical Activity: Insufficiently Active (08/25/2022)  Social Connections: Moderately Integrated (08/25/2022)  Stress: Stress Concern Present (08/25/2022)  Tobacco Use: High  Risk (06/18/2023)     Distress Screen completed: No     No data to display            Family/Social Information:  Housing Arrangement: patient is unhoused / unstably housed and would not disclose her current living situation.  She said she is looking for a boarding house with her son.  She said Frederich Chick is also trying to help her find housing. Family members/support persons in your life? Family Transportation concerns: yes  Employment: Legally disabled  Income source: Patent attorney concerns: Yes, current concerns Type of concern: Rent/ mortgage, Transportation, and Food Food access concerns: yes Religious or spiritual practice: No Services Currently in place:  [facility] Medicare  Coping/ Adjustment to diagnosis: Patient understands treatment plan and what happens next? yes Concerns about diagnosis and/or treatment: How I will care for other members of my family Patient reported stressors: Housing, Actuary, English as a second language teacher, Arts administrator, and Comptroller and/or priorities: Find housing Patient enjoys time with family/ friends Current coping skills/ strengths: Microbiologist of independent living , Manufacturing systems engineer , Radio producer fund of knowledge , and Supportive family/friends     SUMMARY: Current SDOH Barriers:  Financial constraints related to fixed income.  Clinical Social Work Clinical Goal(s):  Explore community resource options for unmet needs related to:  Housing , English as a second language teacher, Corporate treasurer , and Food Insecurity   Interventions: Discussed common feeling and emotions when being diagnosed with cancer, and the importance of support during treatment Informed patient of the support team roles and support services at [facility] Provided CSW contact information and encouraged patient to call with any questions or concerns Provided patient with information about the [employer] and made referral to [employer].  Dallie Piles will  provide patient with a bag of  food and Chief Strategy Officer.  She will also print out a list of colon cancer grants and housing options in [place].   Follow Up Plan: Patient will contact CSW with any support or resource needs Patient verbalizes understanding of plan: Yes    Dorothey Baseman, LCSW Clinical Social Worker [facility]

## 2023-06-19 NOTE — Addendum Note (Signed)
Addended by: [name] on: 06/19/2023 08:35 AM   Modules accepted: Orders

## 2023-06-19 NOTE — Telephone Encounter (Signed)
Request sent to [facility] for tissue NGS testing.

## 2023-06-19 NOTE — Telephone Encounter (Signed)
Call to patient who said that she has taken Phenergan without problems in the past, but not prochlorperazine

## 2023-06-19 NOTE — Telephone Encounter (Signed)
Pharmacy informed of new prescription being sent and to disregard te compazine prescription sent earlier

## 2023-06-20 ENCOUNTER — Other Ambulatory Visit: Payer: Self-pay

## 2023-06-20 ENCOUNTER — Encounter: Payer: Self-pay | Admitting: Hospice and Palliative Medicine

## 2023-06-20 ENCOUNTER — Inpatient Hospital Stay (HOSPITAL_BASED_OUTPATIENT_CLINIC_OR_DEPARTMENT_OTHER): Payer: 59 | Admitting: Hospice and Palliative Medicine

## 2023-06-20 ENCOUNTER — Inpatient Hospital Stay: Payer: 59

## 2023-06-20 ENCOUNTER — Ambulatory Visit
Admission: RE | Admit: 2023-06-20 | Discharge: 2023-06-20 | Disposition: A | Discharge status: Home / Self Care | Payer: 59 | Source: Ambulatory Visit | Attending: Oncology | Admitting: Oncology

## 2023-06-20 ENCOUNTER — Other Ambulatory Visit: Payer: Self-pay | Admitting: *Deleted

## 2023-06-20 VITALS — BP 109/63 | HR 57 | Temp 96.3°F | Resp 16 | Ht 59.0 in | Wt 137.0 lb

## 2023-06-20 VITALS — BP 106/67 | HR 71 | Temp 97.4°F | Resp 18

## 2023-06-20 DIAGNOSIS — R059 Cough, unspecified: Secondary | ICD-10-CM | POA: Diagnosis not present

## 2023-06-20 DIAGNOSIS — C20 Malignant neoplasm of rectum: Secondary | ICD-10-CM

## 2023-06-20 DIAGNOSIS — Z79899 Other long term (current) drug therapy: Secondary | ICD-10-CM | POA: Diagnosis not present

## 2023-06-20 DIAGNOSIS — K602 Anal fissure, unspecified: Secondary | ICD-10-CM

## 2023-06-20 DIAGNOSIS — Z4682 Encounter for fitting and adjustment of non-vascular catheter: Secondary | ICD-10-CM | POA: Diagnosis not present

## 2023-06-20 MED ORDER — HEPARIN SOD (PORK) LOCK FLUSH 100 UNIT/ML IV SOLN
500.0000 [IU] | Freq: Once | INTRAVENOUS | Status: AC | PRN
  Administered 2023-06-20: 500 [IU]
  Filled 2023-06-20: qty 5

## 2023-06-20 MED ORDER — SODIUM CHLORIDE 0.9% FLUSH
10.0000 mL | Freq: Once | INTRAVENOUS | Status: AC | PRN
  Administered 2023-06-20: 10 mL
  Filled 2023-06-20: qty 10

## 2023-06-20 MED ORDER — ALTEPLASE 2 MG IJ SOLR
2.0000 mg | Freq: Once | INTRAMUSCULAR | Status: AC | PRN
  Administered 2023-06-20: 2 mg
  Filled 2023-06-20: qty 2

## 2023-06-20 NOTE — Progress Notes (Signed)
Symptom Management Clinic [facility] at [facility] Telephone:([phone] [phone]  Patient Care Team: Alba Cory, MD as PCP - General (Family Medicine) Alfredo Martinez, MD as Consulting Physician (Urology) Benita Gutter, RN as Oncology Nurse Navigator [name], MD as Consulting Physician (Oncology) Carmina Miller, MD as Consulting Physician (Radiation Oncology) Frederich Chick [facility] [employer] [employer] (Psychiatry) Coralyn Helling, MD as Consulting Physician (Pulmonary Disease) Andria Meuse, MD as Consulting Physician (General Surgery) Toney Reil, MD as Consulting Physician (Gastroenterology)   NAME OF PATIENT: April Luna  [id number]  07/25/1971   DATE OF VISIT: 06/20/23  REASON FOR CONSULT: April Luna is a 52 y.o. female with multiple medical problems including stage III rectal cancer currently on adjuvant chemotherapy.   INTERVAL HISTORY: Patient saw Dr. Cathie Hoops on 06/14/2023 with rising CEA and CT imaging suggestive of disease progression.  Patient was recommended to rotate to FOLFIRI plus Bev.  Patient presents [facility] for evaluation of cough and congestion.  Additionally, she reports new open sore at area of previous rectal surgery.  Patient says that she started developing cough and congestion 4 days ago.  However, symptoms have improved daily on Mucinex.  She denies fever or chills.  No worsening shortness of breath or chest pain.  Denies GI or GU symptoms.    Patient states that she has noticed slight burning in open area at location of previous rectal surgery.  She denies any drainage or discharge.   PAST MEDICAL HISTORY: Past Medical History:  Diagnosis Date   Allergy    pollen   Anxiety    Arthritis    right hip   Bipolar 1 disorder (HCC)    Cancer (HCC)    rectal   Chemotherapy-induced neuropathy (HCC) 10/24/2022   Chronic kidney disease    COPD (chronic obstructive pulmonary  disease) (HCC)    Depression    Family history of adverse reaction to anesthesia    grand father had a stroke during anesthesia   Family history of breast cancer    Family history of colon cancer    Family history of uterine cancer    GERD (gastroesophageal reflux disease)    History of kidney stones    Hyperlipidemia    Hypertension    Hypothyroidism    Panic attack    Pneumonia    Psoriasis    Sleep apnea 08/11/2021   No CPAP   Type 2 diabetes mellitus with microalbuminuria, without long-term current use of insulin (HCC) 06/24/2019    PAST SURGICAL HISTORY:  Past Surgical History:  Procedure Laterality Date   BREAST BIOPSY Left 12/14/2021   [place] bx, venus marker, path pending   CESAREAN SECTION     COLONOSCOPY WITH PROPOFOL N/A 02/09/2022   Procedure: COLONOSCOPY WITH PROPOFOL;  Surgeon: Toney Reil, MD;  Location: [facility];  Service: Endoscopy;  Laterality: N/A;  sleep apnea   CYSTOSCOPY W/ RETROGRADES Left 11/08/2018   Procedure: CYSTOSCOPY WITH RETROGRADE PYELOGRAM;  Surgeon: Sondra Come, MD;  Location: [facility];  Service: Urology;  Laterality: Left;   CYSTOSCOPY/URETEROSCOPY/HOLMIUM LASER/STENT PLACEMENT Left 11/08/2018   Procedure: CYSTOSCOPY/URETEROSCOPY/HOLMIUM LASER/STENT PLACEMENT;  Surgeon: Sondra Come, MD;  Location: [facility];  Service: Urology;  Laterality: Left;   IR IMAGING GUIDED PORT INSERTION  05/11/2022   MOUTH SURGERY     wisdom teeth extraction   MOUTH SURGERY     teeth removal   POLYPECTOMY N/A 02/09/2022   Procedure: POLYPECTOMY;  Surgeon: Toney Reil, MD;  Location: [facility];  Service: Endoscopy;  Laterality: N/A;   XI ROBOTIC ASSISTED LOWER ANTERIOR RESECTION N/A 04/21/2022   Procedure: XI ROBOTIC ASSISTED LOWER ANTERIOR RESECTION WITH COLOSTOMY, BILATERAL TAP BLOCK, ASSESSMENT OF TISSUE PERFUSSION WITH FIREFLY INJECTION;  Surgeon: Andria Meuse, MD;  Location: [facility];  Service: General;  Laterality:  N/A;    HEMATOLOGY/ONCOLOGY HISTORY:  Oncology History  Rectal cancer (HCC)  02/26/2022 Imaging   MRI PELVIS WITHOUT CONTRAST- By imaging, rectal cancer stage:  T1/T2, N0, Mx    03/02/2022 Imaging   CT CHEST AND ABDOMEN WITH CONTRAST 1. No convincing evidence of metastatic disease within the chest or abdomen. 2. Atrophic left kidney with multifocal renal scarring and cortical calcifications as well as nonobstructive renal stones measuring up to 5 mm. 3. Prominent left-sided predominant retroperitoneal lymph nodes measuring up to 8 mm near the level of the renal hilum, overall decreased in size dating back to CT September 19, 2018 and favored reactive related to left renal inflammation. 4.  Aortic Atherosclerosis (ICD10-I70.0).   03/27/2022 Genetic Testing    [employer] Custom[employer]+RNA cancer panel found no pathogenic mutations.    04/21/2022 Initial Diagnosis   Rectal cancer - baseline CEA 3.6 -02/09/2022, patient had colonoscopy which showed renal mass 10 cm from anal verge.  5 mm polyp in ascending colon.  Removed and retrieved. Pathology showed rectal adenocarcinoma.  The polyp in the ascending colon is a tubular adenoma.  MRI showed cT1/T2N0 disease  -04/21/2022, patient underwent robotic assisted ultralow anterior resection. Pathology showed moderately differentiated adenocarcinoma, 4.5 cm in maximal extent, with focal extension through muscularis propria into perirectal soft tissue.  3 lymph nodes positive for metastatic carcinoma.  Negative margin.  pT3 pN1b, MSI stable.   05/03/2022 Cancer Staging   Staging form: Colon and Rectum, AJCC 8th Edition - Pathologic stage from 05/03/2022: Stage IIIB (pT3, pN1b, cM0) - Signed by [name], MD on 05/03/2022 Stage prefix: Initial diagnosis   05/11/2022 Miscellaneous   Medi port placed by Dr.White   05/26/2022 -  Chemotherapy   FOLFOX Q2 weeks x 4   05/26/2022 - 07/09/2022 Chemotherapy   Patient is on Treatment Plan : COLORECTAL FOLFOX q14d x 8 cycles      05/26/2022 - 12/13/2022 Chemotherapy   Patient is on Treatment Plan : COLORECTAL FOLFOX q14d x 4 months     07/27/2022 -  Chemotherapy   Concurrent chemotherapy- Xeloda  1300mg  BID and radiation.    07/27/2022 - 09/12/2022 Radiation Therapy    concurrent chemotherapy [Xeloda 1300 mg BID] with RT    09/20/2022 Imaging   CT Angiogram chest PET protocol There is no evidence of pulmonary artery embolism. There is no evidence of thoracic aortic dissection.   Small linear patchy alveolar infiltrate is seen in medial segment of right middle lobe suggesting atelectasis/pneumonia.     09/28/2022 - 09/30/2022 Hospital Admission   Admission due to acute respiratory failure with hypoxia, COPD exacerbation   09/28/2022 Imaging   CT angio chest PE protocol 1. Negative for acute PE or thoracic aortic dissection. 2. New ground-glass infiltrates anteriorly in bilateral upper lobes,may represent atypical edema, infectious or inflammatory process. 3.  Aortic Atherosclerosis     12/22/2022 Imaging   CT chest abdomen pelvis w contrast  Stable exam. No evidence of recurrent or metastatic carcinoma within the chest, abdomen, or pelvis.   Left nephrolithiasis and renal parenchymal atrophy. No evidence of ureteral calculi or hydronephrosis.   Aortic Atherosclerosis  05/31/2023 Imaging   CT chest abdomen pelvis with contrast showed 1. New hypodense 16 mm lesion in the right lobe of the liver with very subtle adjacent 5 mm lesion, suspicious for metastatic disease. 2. Small bilateral pulmonary nodules, some of which were subtly evident on prior examination only in retrospect, some of which demonstrate degree of cavitation, suspicious for metastatic disease. 3. Prominent retroperitoneal lymph nodes are similar prior. 4. Prior low anterior resection with Hartmann's pouch formation,similar small amount of perirectal/presacral soft tissue and fluid,favored postsurgical/posttreatment change. Suggest  continued attention on follow-up imaging. 5. Large volume of formed stool in the colon. 6. Nonobstructive left renal calculi measure under 1 cm.     06/08/2023 Procedure   Ultrasound-guided liver biopsy showed Pathology showed metastatic carcinoma, morphology consistent with patient's clinical history of rectal adenocarcinoma.   06/18/2023 Cancer Staging   Staging form: Colon and Rectum, AJCC 8th Edition - Pathologic stage from 06/18/2023: Stage IVA (rpTX, pN0, pM1a) - Signed by [name], MD on 06/18/2023 Stage prefix: Recurrence Total positive nodes: 0   06/25/2023 -  Chemotherapy   Patient is on Treatment Plan : COLORECTAL FOLFIRI + Bevacizumab q14d     06/25/2023 - 06/25/2023 Chemotherapy   Patient is on Treatment Plan : COLORECTAL FOLFOX + Bevacizumab q14d       ALLERGIES:  is allergic to metformin and related, nsaids, perphenazine, sulfa antibiotics, abilify [aripiprazole], and penicillins.  MEDICATIONS:  Current Outpatient Medications  Medication Sig Dispense Refill   ACCU-CHEK GUIDE test strip USE UP TO FOUR TIMES DAILY AS DIRECTED. 100 each 5   acetaminophen (TYLENOL) 325 MG tablet Take 650 mg by mouth every 6 (six) hours as needed for headache (pain).     albuterol (VENTOLIN HFA) 108 (90 Base) MCG/ACT inhaler Inhale 2 puffs into the lungs every 6 (six) hours as needed for wheezing or shortness of breath. 1 each 5   amantadine (SYMMETREL) 100 MG capsule Take 100 mg by mouth 2 (two) times daily.     blood glucose meter kit and supplies Dispense based on patient and insurance preference. Use up to four times daily as directed. (FOR ICD-10 E10.9, E11.9). 1 each 0   Budeson-Glycopyrrol-Formoterol (BREZTRI AEROSPHERE) 160-9-4.8 MCG/ACT AERO Inhale 2 puffs into the lungs in the morning and at bedtime. 5.9 g 11   buPROPion (WELLBUTRIN XL) 300 MG 24 hr tablet Take 300 mg by mouth daily.     Cholecalciferol (VITAMIN D-3) 125 MCG (5000 UT) TABS Take 5,000 Units by mouth daily. 30 tablet 1    clonazePAM (KLONOPIN) 0.5 MG tablet Take 0.5 mg by mouth daily.     dapagliflozin propanediol (FARXIGA) 10 MG TABS tablet Take 1 tablet (10 mg total) by mouth daily before breakfast. 90 tablet 1   diltiazem (CARDIZEM CD) 120 MG 24 hr capsule Take 1 capsule (120 mg total) by mouth daily. 90 capsule 1   docusate sodium (COLACE) 100 MG capsule TAKE 1 CAPSULE BY MOUTH TWICE DAILY 60 capsule 1   fluconazole (DIFLUCAN) 150 MG tablet Take 1 tablet (150 mg total) by mouth every 3 (three) days as needed (for vaginal itching/yeast infection sx). 2 tablet 0   gabapentin (NEURONTIN) 300 MG capsule Take 300 mg by mouth 2 (two) times daily.     hydrocortisone cream 0.5 % Apply 1 Application topically 2 (two) times daily as needed for itching. 30 g 1   Hydrocortisone, Perianal, 1 % CREA Apply topically.     icosapent Ethyl (VASCEPA) 1 g capsule TAKE 2  CAPSULES BY MOUTH 2 TIMES DAILY 120 capsule 3   Insulin Pen Needle 32G X 4 MM MISC 1 each by Does not apply route daily at 12 noon. 100 each 0   ipratropium-albuterol (DUONEB) 0.5-2.5 (3) MG/3ML SOLN Take 3 mLs by nebulization every 6 (six) hours as needed. 360 mL 11   levothyroxine (SYNTHROID) 75 MCG tablet TAKE 1 TABLET BY MOUTH DAILY BEFORE BREAKFAST 90 tablet 2   lidocaine-prilocaine (EMLA) cream Apply to affected area once 30 g 3   loperamide (IMODIUM) 2 MG capsule Take 1 capsule (2 mg total) by mouth See admin instructions. Initial: 4 mg,the 2 mg every 2 hours (4 mg every 4 hours at night)  maximum: 16 mg/day 60 capsule 2   loratadine (CLARITIN) 10 MG tablet Take 10 mg by mouth every morning.     losartan (COZAAR) 25 MG tablet Take 1 tablet (25 mg total) by mouth at bedtime. 30 tablet 3   NON FORMULARY Pt uses a cpap nightly     nystatin cream (MYCOSTATIN) Apply 1 Application topically 2 (two) times daily. 30 g 0   ondansetron (ZOFRAN) 8 MG tablet Take 1 tablet (8 mg total) by mouth every 8 (eight) hours as needed for nausea, vomiting or refractory nausea /  vomiting. Start on the third day after chemotherapy. 30 tablet 1   oxybutynin (DITROPAN-XL) 10 MG 24 hr tablet Take 1 tablet (10 mg total) by mouth daily. 30 tablet 11   paliperidone (INVEGA SUSTENNA) 234 MG/1.5ML SUSY injection Inject 234 mg into the muscle every 30 (thirty) days. On or about the 14th of each month     pantoprazole (PROTONIX) 40 MG tablet TAKE 1 TABLET BY MOUTH DAILY 90 tablet 2   Pediatric Multivit-Minerals-C (CHEWABLES MULTIVITAMIN PO) Take 1 tablet by mouth daily.     pioglitazone (ACTOS) 15 MG tablet Take 1 tablet (15 mg total) by mouth daily. 90 tablet 1   promethazine (PHENERGAN) 12.5 MG tablet Take 1 tablet (12.5 mg total) by mouth every 6 (six) hours as needed for nausea or vomiting. 30 tablet 0   rosuvastatin (CRESTOR) 40 MG tablet TAKE 1 TABLET BY MOUTH EVERY MORNING 30 tablet 3   sertraline (ZOLOFT) 100 MG tablet Take 200 mg by mouth every morning.     Spacer/Aero-Holding Chambers (AEROCHAMBER MV) inhaler Use as instructed 1 each 0   albuterol (PROVENTIL) (2.5 MG/3ML) 0.083% nebulizer solution Take 3 mLs (2.5 mg total) by nebulization every 6 (six) hours as needed for shortness of breath or wheezing. 75 mL 0   No current facility-administered medications for this visit.   Facility-Administered Medications Ordered in Other Visits  Medication Dose Route Frequency Provider Last Rate Last Admin   albuterol (PROVENTIL) (2.5 MG/3ML) 0.083% nebulizer solution 2.5 mg  2.5 mg Nebulization Once Raechel Chute, MD        VITAL SIGNS: BP 109/63 (BP Location: Left Arm, Patient Position: Sitting, Cuff Size: Normal)   Pulse (!) 57   Temp (!) 96.3 F (35.7 C) (Tympanic)   Resp 16   Ht 4\' 11"  (1.499 m)   Wt 137 lb (62.1 kg)   SpO2 95%   BMI 27.67 kg/m  Filed Weights   06/20/23 1035  Weight: 137 lb (62.1 kg)    Estimated body mass index is 27.67 kg/m as calculated from the following:   Height as of this encounter: 4\' 11"  (1.499 m).   Weight as of this encounter: 137 lb  (62.1 kg).  LABS: CBC:  Component Value Date/Time   WBC 8.2 06/08/2023 0913   HGB 13.7 06/08/2023 0913   HGB 13.7 06/05/2023 0941   HGB 13.2 04/20/2015 0640   HCT 40.7 06/08/2023 0913   HCT 39.2 04/20/2015 0640   PLT 143 (L) 06/08/2023 0913   PLT 144 (L) 06/05/2023 0941   PLT 165 04/20/2015 0640   MCV 98.5 06/08/2023 0913   MCV 96 04/20/2015 0640   NEUTROABS 6.8 06/08/2023 0913   NEUTROABS 4.2 04/20/2015 0640   LYMPHSABS 0.8 06/08/2023 0913   LYMPHSABS 2.4 04/20/2015 0640   MONOABS 0.5 06/08/2023 0913   MONOABS 0.6 04/20/2015 0640   EOSABS 0.1 06/08/2023 0913   EOSABS 0.2 04/20/2015 0640   BASOSABS 0.0 06/08/2023 0913   BASOSABS 0.1 04/20/2015 0640   Comprehensive Metabolic Panel:    Component Value Date/Time   NA 140 06/08/2023 0913   NA 139 04/20/2015 0640   K 4.0 06/08/2023 0913   K 3.9 04/20/2015 0640   CL 104 06/08/2023 0913   CL 105 04/20/2015 0640   CO2 26 06/08/2023 0913   CO2 27 04/20/2015 0640   BUN 16 06/08/2023 0913   BUN 12 04/20/2015 0640   CREATININE 0.82 06/08/2023 0913   CREATININE 0.93 06/05/2023 0941   CREATININE 1.08 (H) 10/05/2022 1054   GLUCOSE 119 (H) 06/08/2023 0913   GLUCOSE 95 04/20/2015 0640   CALCIUM 8.8 (L) 06/08/2023 0913   CALCIUM 9.2 04/20/2015 0640   AST 23 06/08/2023 0913   AST 19 06/05/2023 0941   ALT 19 06/08/2023 0913   ALT 15 06/05/2023 0941   ALT 30 04/20/2015 0640   ALKPHOS 95 06/08/2023 0913   ALKPHOS 80 04/20/2015 0640   BILITOT 0.4 06/08/2023 0913   BILITOT 0.3 06/05/2023 0941   PROT 7.0 06/08/2023 0913   PROT 6.5 04/20/2015 0640   ALBUMIN 3.8 06/08/2023 0913   ALBUMIN 3.7 04/20/2015 0640    RADIOGRAPHIC STUDIES: DG Chest 2 View  Result Date: 06/20/2023 CLINICAL DATA:  Cough. EXAM: CHEST - 2 VIEW COMPARISON:  CT chest dated May 31, 2023. Chest x-ray dated September 28, 2022. FINDINGS: Unchanged right chest wall port catheter. The heart size and mediastinal contours are within normal limits. Normal pulmonary  vascularity. No focal consolidation, pleural effusion, or pneumothorax. No acute osseous abnormality. IMPRESSION: No active cardiopulmonary disease. Electronically Signed   By: Obie Dredge M.D.   On: 06/20/2023 10:29   US BIOPSY (LIVER)  Result Date: 06/08/2023 INDICATION: liver mass Briefly, 52 year old female with a history of rectal cancer with new liver masses. EXAM: ULTRASOUND GUIDED LIVER MASS BIOPSY COMPARISON:  CT CAP, 05/31/2023. MEDICATIONS: 10 mL lidocaine 1% ANESTHESIA/SEDATION: Moderate (conscious) sedation was employed during this procedure. A total of Versed 1 mg and Fentanyl 50 mcg was administered intravenously. Moderate Sedation Time: 24 minutes. The patient's level of consciousness and vital signs were monitored continuously by radiology nursing throughout the procedure under my direct supervision. COMPLICATIONS: None immediate. PROCEDURE: Informed written consent was obtained from the patient and/or patient's representative after a discussion of the risks, benefits and alternatives to treatment. The patient understands and consents the procedure. A timeout was performed prior to the initiation of the procedure. Ultrasound scanning was performed of the right upper abdominal quadrant demonstrates small RIGHT hepatic lobe masses. The RIGHT hepatic lobe mass was selected for biopsy and the procedure was planned. The right upper abdominal quadrant was prepped and draped in the usual sterile fashion. The overlying soft tissues were anesthetized with 1% lidocaine with epinephrine.  A 17 gauge, 6.8 cm co-axial needle was advanced into a peripheral aspect of the lesion. This was followed by 4 core biopsies with an 18 gauge core device under direct ultrasound guidance. The coaxial needle tract was embolized with a small amount of Gel-Foam slurry and superficial hemostasis was obtained with manual compression. Post procedural scanning was negative for definitive area of hemorrhage or additional  complication. A dressing was placed. The patient tolerated the procedure well without immediate post procedural complication. IMPRESSION: Successful core needle biopsy of a RIGHT hepatic lobe mass. Roanna Banning, MD [facility] Electronically Signed   By: Roanna Banning M.D.   On: 06/08/2023 11:01   CT CHEST ABDOMEN PELVIS W CONTRAST  Result Date: 05/31/2023 CLINICAL DATA:  History of rectal cancer, follow-up/restaging. * Tracking Code: BO * EXAM: CT CHEST, ABDOMEN, AND PELVIS WITH CONTRAST TECHNIQUE: Multidetector CT imaging of the chest, abdomen and pelvis was performed following the standard protocol during bolus administration of intravenous contrast. RADIATION DOSE REDUCTION: This exam was performed according to the departmental dose-optimization program which includes automated exposure control, adjustment of the mA and/or kV according to patient size and/or use of iterative reconstruction technique. CONTRAST:  OMNIPAQUE IOHEXOL 300 MG/ML  SOLN COMPARISON:  Multiple priors including most recent CT December 21, 2022 FINDINGS: CT CHEST FINDINGS Cardiovascular: Right chest Port-A-Cath with tip near the superior cavoatrial junction. Normal caliber thoracic aorta. No central pulmonary embolus on this nondedicated study. Normal size heart. No significant pericardial effusion/thickening. Mediastinum/Nodes: No suspicious thyroid nodule. No pathologically enlarged mediastinal, hilar or axillary lymph nodes. Reflux versus retained contrast in the esophagus. Lungs/Pleura: Small bilateral pulmonary nodules, some of which were subtly evident on prior examination only in retrospect, some of which demonstrate degree of cavitation. For reference: -solid 4 mm right middle lobe pulmonary nodule on image 77/3 previously 2 mm. -solid centrally cavitary pulmonary nodule in the left upper lobe measuring 6 mm on image 52/3 previously 3 mm.-partially cavitary subpleural  left lower lobe pulmonary nodule now measuring 5 mm on image 74/3 previously 2-3 mm. No pleural effusion.  No pneumothorax. Musculoskeletal: No aggressive lytic or blastic lesion of bone. CT ABDOMEN PELVIS FINDINGS Hepatobiliary: New hypodense 16 mm lesion in the right lobe of the liver on image 51/4. Very subtle adjacent 5 mm lesion on image 51/2. Gallbladder is unremarkable. No biliary ductal dilation. Pancreas: No pancreatic ductal dilation or evidence of acute inflammation. Spleen: No splenomegaly or focal splenic lesion. Adrenals/Urinary Tract: Bilateral adrenal glands appear normal. No hydronephrosis. Left renal parenchymal atrophy/scarring. Multiple nonobstructive left renal calculi measure under 1 cm. Multiple small benign renal cysts are again seen bilaterally. Urinary bladder is unremarkable for degree of distension. Stomach/Bowel: Radiopaque enteric contrast material traverses the stomach. No pathologic dilation of small or large bowel. Large volume of formed stool in the colon. Left anterior abdominal wall end colostomy. 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 Similar small amount of perirectal/presacral soft tissue and fluid. Vascular/Lymphatic: Normal caliber abdominal aorta. Smooth IVC contours. Prominent retroperitoneal lymph nodes are similar prior for instance the previously indexed left periaortic lymph node measures 9 mm on image 64/2, unchanged. Reproductive: Uterus and bilateral adnexa are unremarkable. Other: No significant abdominopelvic free fluid. No discrete peritoneal or mental nodularity. Soft tissue nodularity in the posterior gluteal subcutaneous fat for instance on image 88/2 is unchanged. Musculoskeletal: Postradiation change in the sacrum. Hyperdense focus in the S2 vertebral body is stable from prior studies and likely reflects  a bone island. IMPRESSION: 1. New hypodense 16 mm lesion in the right lobe of the liver with very subtle adjacent 5 mm lesion,  suspicious for metastatic disease. 2. Small bilateral pulmonary nodules, some of which were subtly evident on prior examination only in retrospect, some of which demonstrate degree of cavitation, suspicious for metastatic disease. 3. Prominent retroperitoneal lymph nodes are similar prior. 4. Prior low anterior resection with Hartmann's pouch formation, similar small amount of perirectal/presacral soft tissue and fluid, favored postsurgical/posttreatment change. Suggest continued attention on follow-up imaging. 5. Large volume of formed stool in the colon. 6. Nonobstructive left renal calculi measure under 1 cm. These results will be called to the ordering clinician or representative by the Radiologist Assistant, and communication documented in the PACS or [employer]. Electronically Signed   By: Maudry Mayhew M.D.   On: 05/31/2023 16:52    PERFORMANCE STATUS (ECOG) : 1 - Symptomatic but completely ambulatory  Review of Systems Unless otherwise noted, a complete review of systems is negative.  Physical Exam General: NAD Cardiovascular: regular rate and rhythm Pulmonary: clear ant fields Abdomen: soft, nontender, + bowel sounds GU: no suprapubic tenderness Extremities: no edema, no joint deformities Skin: no rashes Neurological: Weakness but otherwise nonfocal  Slight superficial fissure with small amount of bleeding noted at perianal area.  No drainage.    Exam chaperoned by Vivien Presto, RN  IMPRESSION/PLAN: Stage IV rectal cancer -CT on 05/31/2023 showed new hepatic metastasis and small bilateral pulmonary nodules concerning for disease progression.  Patient rotated to FOLFIRI plus Bev.  URI - normal CXR. Symptoms improving. Treat symptomatically.   Rectal fissure -recent CT on 05/31/2023 showed small amount of perirectal/presacral soft tissue and fluid favoring postsurgical posttreatment change.  No note of abscess on imaging.  Area appears to be quite superficial with small amount  of bleeding.  Likely conservative treatment and discussed general wound care with soap and water.  Will refer back to general surgery - Dr. Marin Olp.   Case and plan discussed with Dr. Cathie Hoops  Patient expressed understanding and was in agreement with this plan. She also understands that She can call clinic at any time with any questions, concerns, or complaints.   Thank you for allowing me to participate in the care of this very pleasant patient.   Time Total: 15 minutes  Visit consisted of counseling and education dealing with the complex and emotionally intense issues of symptom management in the setting of serious illness.Greater than 50%  of this time was spent counseling and coordinating care related to the above assessment and plan.  Signed by: Laurette Schimke, PhD, NP-C

## 2023-06-20 NOTE — Progress Notes (Signed)
Patient is reporting a wound on around her rectum that she noticed has "opened up" from a previous surgery noted to be around a year ago. She said she noticed it a few months ago but has not mentioned it to anyone and is unsure of any drainage or size of opening. She also reports a productive cough for 4 days she is taking mucinex for. Cathie Hoops, MD notified. Per Cathie Hoops, MD patient to have stat CXR and see [facility]. Pt transported to medical mall for CXR. Pt was educated to return to cancer center after CXR for appointment with Elouise Munroe, NP.

## 2023-06-20 NOTE — Progress Notes (Signed)
Has a spot on rectal area where surgery was done in April of last year that is opening up that she is worried. It is causing some pain but does not note any drainage. When sitting down for a long period it does cause pain. No fevers that she knows of. Also has a cough x4 days that she has been taking mucinex for. Cough is a little productive.

## 2023-06-21 ENCOUNTER — Other Ambulatory Visit: Payer: Self-pay | Admitting: Family Medicine

## 2023-06-21 DIAGNOSIS — R809 Proteinuria, unspecified: Secondary | ICD-10-CM

## 2023-06-21 DIAGNOSIS — K219 Gastro-esophageal reflux disease without esophagitis: Secondary | ICD-10-CM

## 2023-06-21 DIAGNOSIS — E032 Hypothyroidism due to medicaments and other exogenous substances: Secondary | ICD-10-CM

## 2023-06-21 DIAGNOSIS — I10 Essential (primary) hypertension: Secondary | ICD-10-CM

## 2023-06-22 MED FILL — Dexamethasone Sodium Phosphate Inj 100 MG/10ML: INTRAMUSCULAR | Qty: 1 | Status: AC

## 2023-06-22 NOTE — Progress Notes (Signed)
Called to speak with April Luna about the April Luna, when I called her number it said it could not be completed as dialed.

## 2023-06-25 ENCOUNTER — Other Ambulatory Visit: Payer: Self-pay

## 2023-06-25 ENCOUNTER — Inpatient Hospital Stay: Payer: 59

## 2023-06-25 ENCOUNTER — Ambulatory Visit
Admission: RE | Admit: 2023-06-25 | Discharge: 2023-06-25 | Disposition: A | Discharge status: Home / Self Care | Payer: 59 | Source: Ambulatory Visit | Attending: Oncology | Admitting: Oncology

## 2023-06-25 ENCOUNTER — Inpatient Hospital Stay: Payer: 59 | Attending: Oncology | Admitting: Oncology

## 2023-06-25 ENCOUNTER — Encounter: Payer: Self-pay | Admitting: Oncology

## 2023-06-25 ENCOUNTER — Ambulatory Visit: Payer: 59 | Admitting: Primary Care

## 2023-06-25 VITALS — BP 116/92 | HR 72 | Temp 96.2°F | Resp 18 | Ht 59.0 in | Wt 139.8 lb

## 2023-06-25 DIAGNOSIS — T451X5A Adverse effect of antineoplastic and immunosuppressive drugs, initial encounter: Secondary | ICD-10-CM | POA: Diagnosis not present

## 2023-06-25 DIAGNOSIS — I129 Hypertensive chronic kidney disease with stage 1 through stage 4 chronic kidney disease, or unspecified chronic kidney disease: Secondary | ICD-10-CM | POA: Diagnosis not present

## 2023-06-25 DIAGNOSIS — E1122 Type 2 diabetes mellitus with diabetic chronic kidney disease: Secondary | ICD-10-CM | POA: Diagnosis not present

## 2023-06-25 DIAGNOSIS — D122 Benign neoplasm of ascending colon: Secondary | ICD-10-CM | POA: Diagnosis not present

## 2023-06-25 DIAGNOSIS — G620 Drug-induced polyneuropathy: Secondary | ICD-10-CM | POA: Diagnosis not present

## 2023-06-25 DIAGNOSIS — E785 Hyperlipidemia, unspecified: Secondary | ICD-10-CM | POA: Insufficient documentation

## 2023-06-25 DIAGNOSIS — E1140 Type 2 diabetes mellitus with diabetic neuropathy, unspecified: Secondary | ICD-10-CM | POA: Diagnosis not present

## 2023-06-25 DIAGNOSIS — Z79899 Other long term (current) drug therapy: Secondary | ICD-10-CM | POA: Insufficient documentation

## 2023-06-25 DIAGNOSIS — Z5111 Encounter for antineoplastic chemotherapy: Secondary | ICD-10-CM | POA: Insufficient documentation

## 2023-06-25 DIAGNOSIS — Z7984 Long term (current) use of oral hypoglycemic drugs: Secondary | ICD-10-CM | POA: Diagnosis not present

## 2023-06-25 DIAGNOSIS — R197 Diarrhea, unspecified: Secondary | ICD-10-CM | POA: Diagnosis not present

## 2023-06-25 DIAGNOSIS — C20 Malignant neoplasm of rectum: Secondary | ICD-10-CM | POA: Insufficient documentation

## 2023-06-25 DIAGNOSIS — Z79890 Hormone replacement therapy (postmenopausal): Secondary | ICD-10-CM | POA: Diagnosis not present

## 2023-06-25 DIAGNOSIS — F209 Schizophrenia, unspecified: Secondary | ICD-10-CM | POA: Insufficient documentation

## 2023-06-25 DIAGNOSIS — Z452 Encounter for adjustment and management of vascular access device: Secondary | ICD-10-CM | POA: Insufficient documentation

## 2023-06-25 DIAGNOSIS — E039 Hypothyroidism, unspecified: Secondary | ICD-10-CM | POA: Insufficient documentation

## 2023-06-25 DIAGNOSIS — T82828A Fibrosis of vascular prosthetic devices, implants and grafts, initial encounter: Secondary | ICD-10-CM | POA: Diagnosis not present

## 2023-06-25 DIAGNOSIS — Z5189 Encounter for other specified aftercare: Secondary | ICD-10-CM | POA: Insufficient documentation

## 2023-06-25 DIAGNOSIS — F17210 Nicotine dependence, cigarettes, uncomplicated: Secondary | ICD-10-CM | POA: Insufficient documentation

## 2023-06-25 DIAGNOSIS — I700 Atherosclerosis of aorta: Secondary | ICD-10-CM | POA: Insufficient documentation

## 2023-06-25 DIAGNOSIS — Z95828 Presence of other vascular implants and grafts: Secondary | ICD-10-CM | POA: Diagnosis not present

## 2023-06-25 DIAGNOSIS — N200 Calculus of kidney: Secondary | ICD-10-CM | POA: Diagnosis not present

## 2023-06-25 DIAGNOSIS — F319 Bipolar disorder, unspecified: Secondary | ICD-10-CM | POA: Insufficient documentation

## 2023-06-25 DIAGNOSIS — Z800 Family history of malignant neoplasm of digestive organs: Secondary | ICD-10-CM | POA: Insufficient documentation

## 2023-06-25 DIAGNOSIS — Z803 Family history of malignant neoplasm of breast: Secondary | ICD-10-CM | POA: Diagnosis not present

## 2023-06-25 DIAGNOSIS — K219 Gastro-esophageal reflux disease without esophagitis: Secondary | ICD-10-CM | POA: Insufficient documentation

## 2023-06-25 DIAGNOSIS — Z87442 Personal history of urinary calculi: Secondary | ICD-10-CM | POA: Diagnosis not present

## 2023-06-25 DIAGNOSIS — G4730 Sleep apnea, unspecified: Secondary | ICD-10-CM | POA: Insufficient documentation

## 2023-06-25 DIAGNOSIS — T82598A Other mechanical complication of other cardiac and vascular devices and implants, initial encounter: Secondary | ICD-10-CM | POA: Diagnosis not present

## 2023-06-25 HISTORY — PX: IR CV LINE INJECTION: IMG2294

## 2023-06-25 LAB — PROTEIN, URINE, RANDOM: Total Protein, Urine: <6 mg/dL

## 2023-06-25 MED ORDER — ALTEPLASE 2 MG IJ SOLR
2.0000 mg | Freq: Once | INTRAMUSCULAR | Status: AC | PRN
  Administered 2023-06-25: 2 mg
  Filled 2023-06-25: qty 2

## 2023-06-25 MED ORDER — SODIUM CHLORIDE 0.9% FLUSH
10.0000 mL | Freq: Once | INTRAVENOUS | Status: AC
  Administered 2023-06-25: 10 mL via INTRAVENOUS
  Filled 2023-06-25: qty 10

## 2023-06-25 MED ORDER — HEPARIN SOD (PORK) LOCK FLUSH 100 UNIT/ML IV SOLN
INTRAVENOUS | Status: AC
  Filled 2023-06-25: qty 5

## 2023-06-25 MED ORDER — HEPARIN SOD (PORK) LOCK FLUSH 100 UNIT/ML IV SOLN
500.0000 [IU] | Freq: Once | INTRAVENOUS | Status: AC
  Administered 2023-06-25: 500 [IU] via INTRAVENOUS

## 2023-06-25 NOTE — Assessment & Plan Note (Signed)
Malfunction of medi port, she will get dye study today

## 2023-06-25 NOTE — Assessment & Plan Note (Signed)
Chemotherapy plan as listed above 

## 2023-06-25 NOTE — Patient Instructions (Signed)

## 2023-06-25 NOTE — Assessment & Plan Note (Addendum)
History of Stage III. pT3 pN1b cM0, s/p APR. Currently on adjuvant chemotherapy. S/p FOLFOX x 4 cycles S/p concurrent chemotherapy [Xeloda 1300 mg BID] with RT, and then another 4 cycles of adjuvant FOLFOX [dose reduced oxaliplatin and omit 5-FU bolus CT imaging indicates disease progression.  Ultrasound-guided liver biopsy pathology showed metastatic carcinoma consistent with history of rectal adenocarcinoma.  CEA is elevated. Discussed with patient about stage IV current rectal adenocarcinoma I recommend checking [facility] Recommend systemic chemotherapy FOLFIRI Plus bevacizumab. She will proceed with lab work and C1 treatment on 7/3

## 2023-06-25 NOTE — Progress Notes (Signed)
Hematology/Oncology Progress note [phone] [phone]      CHIEF COMPLAINTS/REASON FOR VISIT:  Follow-up for rectal cancer treatments.  ASSESSMENT & PLAN:   Cancer Staging  Rectal cancer [facility]) Staging form: Colon and Rectum, AJCC 8th Edition - Pathologic stage from 05/03/2022: Stage IIIB (pT3, pN1b, cM0) - Signed by [name], MD on 05/03/2022 - Pathologic stage from 06/18/2023: Stage IVA (rpTX, pN0, pM1a) - Signed by [name], MD on 06/18/2023   Rectal cancer [facility]) History of Stage III. pT3 pN1b cM0, s/p APR. Currently on adjuvant chemotherapy. S/p FOLFOX x 4 cycles S/p concurrent chemotherapy [Xeloda 1300 mg BID] with RT, and then another 4 cycles of adjuvant FOLFOX [dose reduced oxaliplatin and omit 5-FU bolus CT imaging indicates disease progression.  Ultrasound-guided liver biopsy pathology showed metastatic carcinoma consistent with history of rectal adenocarcinoma.  CEA is elevated. Discussed with patient about stage IV current rectal adenocarcinoma I recommend checking [place] NGS Recommend systemic chemotherapy FOLFIRI Plus bevacizumab. She will proceed with lab work and C1 treatment on 7/3    Chemotherapy-induced neuropathy (HCC) Grade 1, stabe  Encounter for antineoplastic chemotherapy Chemotherapy plan as listed above  Port-A-Cath in place Malfunction of medi port, she will get dye study today  Patient has been referred to Child psychotherapist.   Orders Placed This Encounter  Procedures   CBC with Differential (Cancer Center Only)    Standing Status:   Future    Standing Expiration Date:   06/26/2024   CMP (Cancer Center only)    Standing Status:   Future    Standing Expiration Date:   06/26/2024   Protein, urine, random    Standing Status:   Future    Standing Expiration Date:   07/10/2024   CBC with Differential (Cancer Center Only)    Standing Status:   Future    Standing Expiration Date:   07/10/2024   CMP (Cancer Center only)    Standing  Status:   Future    Standing Expiration Date:   07/10/2024    Follow-up 1 week for evaluation of treatment response.  All questions were answered. The patient knows to call the clinic with any problems, questions or concerns.  [name], MD, PhD [facility] Hematology Oncology 06/25/2023      HISTORY OF PRESENTING ILLNESS:   April Luna is a  52 y.o.  female presents for treatment of rectal cancer Oncology History  Rectal cancer (HCC)  02/26/2022 Imaging   MRI PELVIS WITHOUT CONTRAST- By imaging, rectal cancer stage:  T1/T2, N0, Mx    03/02/2022 Imaging   CT CHEST AND ABDOMEN WITH CONTRAST 1. No convincing evidence of metastatic disease within the chest or abdomen. 2. Atrophic left kidney with multifocal renal scarring and cortical calcifications as well as nonobstructive renal stones measuring up to 5 mm. 3. Prominent left-sided predominant retroperitoneal lymph nodes measuring up to 8 mm near the level of the renal hilum, overall decreased in size dating back to CT September 19, 2018 and favored reactive related to left renal inflammation. 4.  Aortic Atherosclerosis (ICD10-I70.0).   03/27/2022 Genetic Testing    [facility] CustomNext+RNA cancer panel found no pathogenic mutations.    04/21/2022 Initial Diagnosis   Rectal cancer - baseline CEA 3.6 -02/09/2022, patient had colonoscopy which showed renal mass 10 cm from anal verge.  5 mm polyp in ascending colon.  Removed and retrieved. Pathology showed rectal adenocarcinoma.  The polyp in the ascending colon is a tubular adenoma.  MRI showed cT1/T2N0  disease  -04/21/2022, patient underwent robotic assisted ultralow anterior resection. Pathology showed moderately differentiated adenocarcinoma, 4.5 cm in maximal extent, with focal extension through muscularis propria into perirectal soft tissue.  3 lymph nodes positive for metastatic carcinoma.  Negative margin.  pT3 pN1b, MSI stable.   05/03/2022 Cancer Staging   Staging form: Colon  and Rectum, AJCC 8th Edition - Pathologic stage from 05/03/2022: Stage IIIB (pT3, pN1b, cM0) - Signed by [name], MD on 05/03/2022 Stage prefix: Initial diagnosis   05/11/2022 Miscellaneous   Medi port placed by Dr.White   05/26/2022 -  Chemotherapy   FOLFOX Q2 weeks x 4   05/26/2022 - 07/09/2022 Chemotherapy   Patient is on Treatment Plan : COLORECTAL FOLFOX q14d x 8 cycles     05/26/2022 - 12/13/2022 Chemotherapy   Patient is on Treatment Plan : COLORECTAL FOLFOX q14d x 4 months     07/27/2022 -  Chemotherapy   Concurrent chemotherapy- Xeloda  1300mg  BID and radiation.    07/27/2022 - 09/12/2022 Radiation Therapy    concurrent chemotherapy [Xeloda 1300 mg BID] with RT    09/20/2022 Imaging   CT Angiogram chest PET protocol There is no evidence of pulmonary artery embolism. There is no evidence of thoracic aortic dissection.   Small linear patchy alveolar infiltrate is seen in medial segment of right middle lobe suggesting atelectasis/pneumonia.     09/28/2022 - 09/30/2022 Hospital Admission   Admission due to acute respiratory failure with hypoxia, COPD exacerbation   09/28/2022 Imaging   CT angio chest PE protocol 1. Negative for acute PE or thoracic aortic dissection. 2. New ground-glass infiltrates anteriorly in bilateral upper lobes,may represent atypical edema, infectious or inflammatory process. 3.  Aortic Atherosclerosis     12/22/2022 Imaging   CT chest abdomen pelvis w contrast  Stable exam. No evidence of recurrent or metastatic carcinoma within the chest, abdomen, or pelvis.   Left nephrolithiasis and renal parenchymal atrophy. No evidence of ureteral calculi or hydronephrosis.   Aortic Atherosclerosis   05/31/2023 Imaging   CT chest abdomen pelvis with contrast showed 1. New hypodense 16 mm lesion in the right lobe of the liver with very subtle adjacent 5 mm lesion, suspicious for metastatic disease. 2. Small bilateral pulmonary nodules, some of which were subtly evident  on prior examination only in retrospect, some of which demonstrate degree of cavitation, suspicious for metastatic disease. 3. Prominent retroperitoneal lymph nodes are similar prior. 4. Prior low anterior resection with Hartmann's pouch formation,similar small amount of perirectal/presacral soft tissue and fluid,favored postsurgical/posttreatment change. Suggest continued attention on follow-up imaging. 5. Large volume of formed stool in the colon. 6. Nonobstructive left renal calculi measure under 1 cm.     06/08/2023 Procedure   Ultrasound-guided liver biopsy showed Pathology showed metastatic carcinoma, morphology consistent with patient's clinical history of rectal adenocarcinoma.   06/18/2023 Cancer Staging   Staging form: Colon and Rectum, AJCC 8th Edition - Pathologic stage from 06/18/2023: Stage IVA (rpTX, pN0, pM1a) - Signed by [name], MD on 06/18/2023 Stage prefix: Recurrence Total positive nodes: 0   06/25/2023 - 06/25/2023 Chemotherapy   Patient is on Treatment Plan : COLORECTAL FOLFOX + Bevacizumab q14d     06/27/2023 -  Chemotherapy   Patient is on Treatment Plan : COLORECTAL FOLFIRI + Bevacizumab q14d     Patient has bipolar and schizophrenia..  Patient is married  She has housing issue due to previous history of eviction.  she does not live with her husband currently. She lives  with some family members.   INTERVAL HISTORY April Luna is a 52 y.o. female who has above history reviewed by me today presents for follow up visit for rectal cancer.  She feels well today.  Denies any melena or blood in the stool. Stable neuropathy symptoms. Medi port malfunction, she declined peripheral blood work.    Review of Systems  Constitutional:  Positive for fatigue. Negative for appetite change, chills and fever.  HENT:   Negative for hearing loss and voice change.   Eyes:  Negative for eye problems.  Respiratory:  Negative for chest tightness, cough and shortness of breath.    Cardiovascular:  Negative for chest pain.  Gastrointestinal:  Negative for abdominal distention, abdominal pain and blood in stool.       + colostomy  Endocrine: Negative for hot flashes.  Genitourinary:  Negative for difficulty urinating and frequency.   Musculoskeletal:  Negative for arthralgias.  Skin:  Negative for itching and rash.  Neurological:  Positive for numbness. Negative for extremity weakness.  Hematological:  Negative for adenopathy.  Psychiatric/Behavioral:  Negative for confusion.     MEDICAL HISTORY:  Past Medical History:  Diagnosis Date   Allergy    pollen   Anxiety    Arthritis    right hip   Bipolar 1 disorder (HCC)    Cancer (HCC)    rectal   Chemotherapy-induced neuropathy (HCC) 10/24/2022   Chronic kidney disease    COPD (chronic obstructive pulmonary disease) (HCC)    Depression    Family history of adverse reaction to anesthesia    grand father had a stroke during anesthesia   Family history of breast cancer    Family history of colon cancer    Family history of uterine cancer    GERD (gastroesophageal reflux disease)    History of kidney stones    Hyperlipidemia    Hypertension    Hypothyroidism    Panic attack    Pneumonia    Psoriasis    Sleep apnea 08/11/2021   No CPAP   Type 2 diabetes mellitus with microalbuminuria, without long-term current use of insulin (HCC) 06/24/2019    SURGICAL HISTORY: Past Surgical History:  Procedure Laterality Date   BREAST BIOPSY Left 12/14/2021   [place] bx, venus marker, path pending   CESAREAN SECTION     COLONOSCOPY WITH PROPOFOL N/A 02/09/2022   Procedure: COLONOSCOPY WITH PROPOFOL;  Surgeon: Toney Reil, MD;  Location: [facility];  Service: Endoscopy;  Laterality: N/A;  sleep apnea   CYSTOSCOPY W/ RETROGRADES Left 11/08/2018   Procedure: CYSTOSCOPY WITH RETROGRADE PYELOGRAM;  Surgeon: Sondra Come, MD;  Location: [facility];  Service: Urology;  Laterality: Left;    CYSTOSCOPY/URETEROSCOPY/HOLMIUM LASER/STENT PLACEMENT Left 11/08/2018   Procedure: CYSTOSCOPY/URETEROSCOPY/HOLMIUM LASER/STENT PLACEMENT;  Surgeon: Sondra Come, MD;  Location: [facility];  Service: Urology;  Laterality: Left;   IR IMAGING GUIDED PORT INSERTION  05/11/2022   MOUTH SURGERY     wisdom teeth extraction   MOUTH SURGERY     teeth removal   POLYPECTOMY N/A 02/09/2022   Procedure: POLYPECTOMY;  Surgeon: Toney Reil, MD;  Location: [facility];  Service: Endoscopy;  Laterality: N/A;   XI ROBOTIC ASSISTED LOWER ANTERIOR RESECTION N/A 04/21/2022   Procedure: XI ROBOTIC ASSISTED LOWER ANTERIOR RESECTION WITH COLOSTOMY, BILATERAL TAP BLOCK, ASSESSMENT OF TISSUE PERFUSSION WITH FIREFLY INJECTION;  Surgeon: Andria Meuse, MD;  Location: [facility];  Service: General;  Laterality: N/A;    SOCIAL  HISTORY: Social History   Socioeconomic History   Marital status: Married    Spouse name: Lesle Chris   Number of children: 1   Years of education: 12   Highest education level: High school graduate  Occupational History   Occupation: unemployed    Comment: disabled  Tobacco Use   Smoking status: Some Days    Packs/day: 0.25    Years: 34.00    Additional pack years: 0.00    Total pack years: 8.50    Types: Cigarettes    Start date: 05/13/1985    Passive exposure: Current   Smokeless tobacco: Never   Tobacco comments:    5-6 cigarettes weekly- 11/07/2022  Vaping Use   Vaping Use: Former   Start date: 05/25/2018   Quit date: 09/24/2018  Substance and Sexual Activity   Alcohol use: Not Currently    Alcohol/week: 4.0 standard drinks of alcohol    Types: 4 Glasses of wine per week    Comment: quit ETOH in Feb. 2023   Drug use: Not Currently    Types: Marijuana    Comment: 2 days ago   Sexual activity: Not Currently    Birth control/protection: Post-menopausal  Other Topics Concern   Not on file  Social History Narrative   Lives with husband and son     Social Determinants of Health   Financial Resource Strain: High Risk (08/25/2022)   Overall Financial Resource Strain (CARDIA)    Difficulty of Paying Living Expenses: Hard  Food Insecurity: Food Insecurity Present (06/06/2023)   Hunger Vital Sign    Worried About Running Out of Food in the Last Year: Sometimes true    Ran Out of Food in the Last Year: Sometimes true  Transportation Needs: No Transportation Needs (06/06/2023)   PRAPARE - Administrator, Civil Service (Medical): No    Lack of Transportation (Non-Medical): No  Recent Concern: Transportation Needs - Unmet Transportation Needs (03/15/2023)   PRAPARE - Transportation    Lack of Transportation (Medical): Yes    Lack of Transportation (Non-Medical): Yes  Physical Activity: Insufficiently Active (08/25/2022)   Exercise Vital Sign    Days of Exercise per Week: 1 day    Minutes of Exercise per Session: 10 min  Stress: Stress Concern Present (08/25/2022)   [employer] of Occupational Health - Occupational Stress Questionnaire    Feeling of Stress : To some extent  Social Connections: Moderately Integrated (08/25/2022)   Social Connection and Isolation Panel [NHANES]    Frequency of Communication with Friends and Family: More than three times a week    Frequency of Social Gatherings with Friends and Family: Twice a week    Attends Religious Services: Never    Database administrator or Organizations: No    Attends Engineer, structural: 1 to 4 times per year    Marital Status: Married  Catering manager Violence: Not At Risk (09/20/2022)   Humiliation, Afraid, Rape, and Kick questionnaire    Fear of Current or Ex-Partner: No    Emotionally Abused: No    Physically Abused: No    Sexually Abused: No    FAMILY HISTORY: Family History  Problem Relation Age of Onset   Depression Mother    Anxiety disorder Mother    Diabetes Mother    Hypertension Mother    Hyperlipidemia Mother    Cancer Mother     Uterine cancer Mother 64   Cervical cancer Mother 28   Colon cancer Father  Depression Brother    Anxiety disorder Brother    Cancer Maternal Aunt        unk types   Diabetes Mellitus II Maternal Grandmother    Hypercholesterolemia Maternal Grandmother    Breast cancer Maternal Grandmother    Cancer Paternal Grandmother    Diabetes Paternal Grandmother    Melanoma Paternal Grandmother    Stomach cancer Paternal Grandmother     ALLERGIES:  is allergic to metformin and related, nsaids, perphenazine, sulfa antibiotics, abilify [aripiprazole], and penicillins.  MEDICATIONS:  Current Outpatient Medications  Medication Sig Dispense Refill   ACCU-CHEK GUIDE test strip USE UP TO FOUR TIMES DAILY AS DIRECTED. 100 each 5   acetaminophen (TYLENOL) 325 MG tablet Take 650 mg by mouth every 6 (six) hours as needed for headache (pain).     albuterol (PROVENTIL) (2.5 MG/3ML) 0.083% nebulizer solution Take 3 mLs (2.5 mg total) by nebulization every 6 (six) hours as needed for shortness of breath or wheezing. 75 mL 0   albuterol (VENTOLIN HFA) 108 (90 Base) MCG/ACT inhaler Inhale 2 puffs into the lungs every 6 (six) hours as needed for wheezing or shortness of breath. 1 each 5   amantadine (SYMMETREL) 100 MG capsule Take 100 mg by mouth 2 (two) times daily.     blood glucose meter kit and supplies Dispense based on patient and insurance preference. Use up to four times daily as directed. (FOR ICD-10 E10.9, E11.9). 1 each 0   Budeson-Glycopyrrol-Formoterol (BREZTRI AEROSPHERE) 160-9-4.8 MCG/ACT AERO Inhale 2 puffs into the lungs in the morning and at bedtime. 5.9 g 11   buPROPion (WELLBUTRIN XL) 300 MG 24 hr tablet Take 300 mg by mouth daily.     Cholecalciferol (VITAMIN D-3) 125 MCG (5000 UT) TABS Take 5,000 Units by mouth daily. 30 tablet 1   clonazePAM (KLONOPIN) 0.5 MG tablet Take 0.5 mg by mouth daily.     dapagliflozin propanediol (FARXIGA) 10 MG TABS tablet Take 1 tablet (10 mg total) by mouth  daily before breakfast. 90 tablet 1   diltiazem (CARDIZEM CD) 120 MG 24 hr capsule Take 1 capsule (120 mg total) by mouth daily. 90 capsule 1   docusate sodium (COLACE) 100 MG capsule TAKE 1 CAPSULE BY MOUTH TWICE DAILY 60 capsule 1   fluconazole (DIFLUCAN) 150 MG tablet Take 1 tablet (150 mg total) by mouth every 3 (three) days as needed (for vaginal itching/yeast infection sx). 2 tablet 0   gabapentin (NEURONTIN) 300 MG capsule Take 300 mg by mouth 2 (two) times daily.     hydrocortisone cream 0.5 % Apply 1 Application topically 2 (two) times daily as needed for itching. 30 g 1   Hydrocortisone, Perianal, 1 % CREA Apply topically.     icosapent Ethyl (VASCEPA) 1 g capsule TAKE 2 CAPSULES BY MOUTH 2 TIMES DAILY 120 capsule 3   Insulin Pen Needle 32G X 4 MM MISC 1 each by Does not apply route daily at 12 noon. 100 each 0   ipratropium-albuterol (DUONEB) 0.5-2.5 (3) MG/3ML SOLN Take 3 mLs by nebulization every 6 (six) hours as needed. 360 mL 11   levothyroxine (SYNTHROID) 75 MCG tablet TAKE 1 TABLET BY MOUTH DAILY BEFORE BREAKFAST 90 tablet 2   lidocaine-prilocaine (EMLA) cream Apply to affected area once 30 g 3   loperamide (IMODIUM) 2 MG capsule Take 1 capsule (2 mg total) by mouth See admin instructions. Initial: 4 mg,the 2 mg every 2 hours (4 mg every 4 hours at night)  maximum: 16  mg/day 60 capsule 2   loratadine (CLARITIN) 10 MG tablet Take 10 mg by mouth every morning.     losartan (COZAAR) 25 MG tablet TAKE 1 TABLET BY MOUTH DAILY 30 tablet 3   NON FORMULARY Pt uses a cpap nightly     nystatin cream (MYCOSTATIN) Apply 1 Application topically 2 (two) times daily. 30 g 0   ondansetron (ZOFRAN) 8 MG tablet Take 1 tablet (8 mg total) by mouth every 8 (eight) hours as needed for nausea, vomiting or refractory nausea / vomiting. Start on the third day after chemotherapy. 30 tablet 1   oxybutynin (DITROPAN-XL) 10 MG 24 hr tablet Take 1 tablet (10 mg total) by mouth daily. 30 tablet 11    paliperidone (INVEGA SUSTENNA) 234 MG/1.5ML SUSY injection Inject 234 mg into the muscle every 30 (thirty) days. On or about the 14th of each month     pantoprazole (PROTONIX) 40 MG tablet TAKE 1 TABLET BY MOUTH DAILY 90 tablet 2   Pediatric Multivit-Minerals-C (CHEWABLES MULTIVITAMIN PO) Take 1 tablet by mouth daily.     pioglitazone (ACTOS) 15 MG tablet Take 1 tablet (15 mg total) by mouth daily. 90 tablet 1   promethazine (PHENERGAN) 12.5 MG tablet Take 1 tablet (12.5 mg total) by mouth every 6 (six) hours as needed for nausea or vomiting. 30 tablet 0   rosuvastatin (CRESTOR) 40 MG tablet TAKE 1 TABLET BY MOUTH EVERY MORNING 30 tablet 3   sertraline (ZOLOFT) 100 MG tablet Take 200 mg by mouth every morning.     Spacer/Aero-Holding Chambers (AEROCHAMBER MV) inhaler Use as instructed 1 each 0   No current facility-administered medications for this visit.   Facility-Administered Medications Ordered in Other Visits  Medication Dose Route Frequency Provider Last Rate Last Admin   albuterol (PROVENTIL) (2.5 MG/3ML) 0.083% nebulizer solution 2.5 mg  2.5 mg Nebulization Once Raechel Chute, MD         PHYSICAL EXAMINATION: ECOG PERFORMANCE STATUS: 0 - Asymptomatic Vitals:   06/25/23 0842  BP: (!) 116/92  Pulse: 72  Resp: 18  Temp: (!) 96.2 F (35.7 C)  SpO2: 99%    Filed Weights   06/25/23 0842  Weight: 139 lb 12.8 oz (63.4 kg)     Physical Exam Constitutional:      General: She is not in acute distress. HENT:     Head: Normocephalic and atraumatic.  Eyes:     General: No scleral icterus. Cardiovascular:     Rate and Rhythm: Normal rate and regular rhythm.  Pulmonary:     Effort: Pulmonary effort is normal. No respiratory distress.     Breath sounds: No wheezing.     Comments: Decreased breath sound bilaterally Abdominal:     General: Bowel sounds are normal. There is no distension.     Palpations: Abdomen is soft.     Comments: +colostomy   Musculoskeletal:         General: No deformity. Normal range of motion.     Cervical back: Normal range of motion and neck supple.  Skin:    General: Skin is warm and dry.     Findings: No erythema or rash.  Neurological:     Mental Status: She is alert and oriented to person, place, and time. Mental status is at baseline.     Cranial Nerves: No cranial nerve deficit.     Coordination: Coordination normal.  Psychiatric:        Mood and Affect: Mood normal.  LABORATORY DATA:  I have reviewed the data as listed    Latest Ref Rng & Units 06/08/2023    9:13 AM 06/05/2023    9:41 AM 03/05/2023   10:00 AM  CBC  WBC 4.0 - 10.5 K/uL 8.2  7.3  8.7   Hemoglobin 12.0 - 15.0 g/dL 10.2  72.5  36.6   Hematocrit 36.0 - 46.0 % 40.7  40.3  44.1   Platelets 150 - 400 K/uL 143  144  153       Latest Ref Rng & Units 06/08/2023    9:13 AM 06/05/2023    9:41 AM 03/05/2023   10:00 AM  CMP  Glucose 70 - 99 mg/dL 440  347  425   BUN 6 - 20 mg/dL 16  16  14    Creatinine 0.44 - 1.00 mg/dL 9.56  3.87  5.64   Sodium 135 - 145 mmol/L 140  135  135   Potassium 3.5 - 5.1 mmol/L 4.0  4.1  3.8   Chloride 98 - 111 mmol/L 104  104  101   CO2 22 - 32 mmol/L 26  25  25    Calcium 8.9 - 10.3 mg/dL 8.8  8.9  9.1   Total Protein 6.5 - 8.1 g/dL 7.0  7.0  7.3   Total Bilirubin 0.3 - 1.2 mg/dL 0.4  0.3  0.5   Alkaline Phos 38 - 126 U/L 95  95  93   AST 15 - 41 U/L 23  19  21    ALT 0 - 44 U/L 19  15  12          RADIOGRAPHIC STUDIES: I have personally reviewed the radiological images as listed and agreed with the findings in the report. DG Chest 2 View  Result Date: 06/20/2023 CLINICAL DATA:  Cough. EXAM: CHEST - 2 VIEW COMPARISON:  CT chest dated May 31, 2023. Chest x-ray dated September 28, 2022. FINDINGS: Unchanged right chest wall port catheter. The heart size and mediastinal contours are within normal limits. Normal pulmonary vascularity. No focal consolidation, pleural effusion, or pneumothorax. No acute osseous abnormality.  IMPRESSION: No active cardiopulmonary disease. Electronically Signed   By: Obie Dredge M.D.   On: 06/20/2023 10:29   US BIOPSY (LIVER)  Result Date: 06/08/2023 INDICATION: liver mass Briefly, 52 year old female with a history of rectal cancer with new liver masses. EXAM: ULTRASOUND GUIDED LIVER MASS BIOPSY COMPARISON:  CT CAP, 05/31/2023. MEDICATIONS: 10 mL lidocaine 1% ANESTHESIA/SEDATION: Moderate (conscious) sedation was employed during this procedure. A total of Versed 1 mg and Fentanyl 50 mcg was administered intravenously. Moderate Sedation Time: 24 minutes. The patient's level of consciousness and vital signs were monitored continuously by radiology nursing throughout the procedure under my direct supervision. COMPLICATIONS: None immediate. PROCEDURE: Informed written consent was obtained from the patient and/or patient's representative after a discussion of the risks, benefits and alternatives to treatment. The patient understands and consents the procedure. A timeout was performed prior to the initiation of the procedure. Ultrasound scanning was performed of the right upper abdominal quadrant demonstrates small RIGHT hepatic lobe masses. The RIGHT hepatic lobe mass was selected for biopsy and the procedure was planned. The right upper abdominal quadrant was prepped and draped in the usual sterile fashion. The overlying soft tissues were anesthetized with 1% lidocaine with epinephrine. A 17 gauge, 6.8 cm co-axial needle was advanced into a peripheral aspect of the lesion. This was followed by 4 core biopsies with an 18 gauge core device under direct  ultrasound guidance. The coaxial needle tract was embolized with a small amount of Gel-Foam slurry and superficial hemostasis was obtained with manual compression. Post procedural scanning was negative for definitive area of hemorrhage or additional complication. A dressing was placed. The patient tolerated the procedure well without immediate post  procedural complication. IMPRESSION: Successful core needle biopsy of a RIGHT hepatic lobe mass. Roanna Banning, MD [facility] Radiology Electronically Signed   By: Roanna Banning M.D.   On: 06/08/2023 11:01   CT CHEST ABDOMEN PELVIS W CONTRAST  Result Date: 05/31/2023 CLINICAL DATA:  History of rectal cancer, follow-up/restaging. * Tracking Code: BO * EXAM: CT CHEST, ABDOMEN, AND PELVIS WITH CONTRAST TECHNIQUE: Multidetector CT imaging of the chest, abdomen and pelvis was performed following the standard protocol during bolus administration of intravenous contrast. RADIATION DOSE REDUCTION: This exam was performed according to the departmental dose-optimization program which includes automated exposure control, adjustment of the mA and/or kV according to patient size and/or use of iterative reconstruction technique. CONTRAST:  OMNIPAQUE IOHEXOL 300 MG/ML  SOLN COMPARISON:  Multiple priors including most recent CT December 21, 2022 FINDINGS: CT CHEST FINDINGS Cardiovascular: Right chest Port-A-Cath with tip near the superior cavoatrial junction. Normal caliber thoracic aorta. No central pulmonary embolus on this nondedicated study. Normal size heart. No significant pericardial effusion/thickening. Mediastinum/Nodes: No suspicious thyroid nodule. No pathologically enlarged mediastinal, hilar or axillary lymph nodes. Reflux versus retained contrast in the esophagus. Lungs/Pleura: Small bilateral pulmonary nodules, some of which were subtly evident on prior examination only in retrospect, some of which demonstrate degree of cavitation. For reference: -solid 4 mm right middle lobe pulmonary nodule on image 77/3 previously 2 mm. -solid centrally cavitary pulmonary nodule in the left upper lobe measuring 6 mm on image 52/3 previously 3 mm.-partially cavitary subpleural left lower lobe pulmonary nodule now measuring 5 mm on image 74/3 previously 2-3 mm. No pleural  effusion.  No pneumothorax. Musculoskeletal: No aggressive lytic or blastic lesion of bone. CT ABDOMEN PELVIS FINDINGS Hepatobiliary: New hypodense 16 mm lesion in the right lobe of the liver on image 51/4. Very subtle adjacent 5 mm lesion on image 51/2. Gallbladder is unremarkable. No biliary ductal dilation. Pancreas: No pancreatic ductal dilation or evidence of acute inflammation. Spleen: No splenomegaly or focal splenic lesion. Adrenals/Urinary Tract: Bilateral adrenal glands appear normal. No hydronephrosis. Left renal parenchymal atrophy/scarring. Multiple nonobstructive left renal calculi measure under 1 cm. Multiple small benign renal cysts are again seen bilaterally. Urinary bladder is unremarkable for degree of distension. Stomach/Bowel: Radiopaque enteric contrast material traverses the stomach. No pathologic dilation of small or large bowel. Large volume of formed stool in the colon. Left anterior abdominal wall end colostomy. 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 Similar small amount of perirectal/presacral soft tissue and fluid. Vascular/Lymphatic: Normal caliber abdominal aorta. Smooth IVC contours. Prominent retroperitoneal lymph nodes are similar prior for instance the previously indexed left periaortic lymph node measures 9 mm on image 64/2, unchanged. Reproductive: Uterus and bilateral adnexa are unremarkable. Other: No significant abdominopelvic free fluid. No discrete peritoneal or mental nodularity. Soft tissue nodularity in the posterior gluteal subcutaneous fat for instance on image 88/2 is unchanged. Musculoskeletal: Postradiation change in the sacrum. Hyperdense focus in the S2 vertebral body is stable from prior studies and likely reflects a bone island. IMPRESSION: 1. New hypodense 16 mm lesion in the right lobe of the liver with very subtle adjacent 5 mm lesion, suspicious for metastatic disease. 2. Small bilateral  pulmonary nodules, some of which were subtly  evident on prior examination only in retrospect, some of which demonstrate degree of cavitation, suspicious for metastatic disease. 3. Prominent retroperitoneal lymph nodes are similar prior. 4. Prior low anterior resection with Hartmann's pouch formation, similar small amount of perirectal/presacral soft tissue and fluid, favored postsurgical/posttreatment change. Suggest continued attention on follow-up imaging. 5. Large volume of formed stool in the colon. 6. Nonobstructive left renal calculi measure under 1 cm. These results will be called to the ordering clinician or representative by the Radiologist Assistant, and communication documented in the PACS or [employer]. Electronically Signed   By: Maudry Mayhew M.D.   On: 05/31/2023 16:52

## 2023-06-25 NOTE — Addendum Note (Signed)
Addended by: Coralee Rud on: 06/25/2023 11:34 AM   Modules accepted: Orders

## 2023-06-25 NOTE — Progress Notes (Signed)
Patient states that she is having some issues with her porta cath it possibly is not working properly.

## 2023-06-25 NOTE — Progress Notes (Deleted)
@Patient  ID: April Luna, female    DOB: February 16, 1971, 52 y.o.   MRN: [id number]  No chief complaint on file.   Referring provider: Alba Cory, MD  HPI: 52 year old female,, current smoker.  Past medical history significant for presumed COPD/asthma overlap. Patient of Dr. Aundria Rud, last seen 10/11/22.  06/25/2023 Patient presents today for follow-up for obstructive lung disease.  During her last overview she was ordered for pulmonary function testing.  Nitric oxide level was normal.  Contained on ipratropium albuterol nebulizer every 6 hours as needed.  Smoking cessation strongly encouraged     Allergies  Allergen Reactions   Metformin And Related Nausea And Vomiting   Nsaids Other (See Comments)    Stage 3 CKD   Perphenazine Other (See Comments)    Tremors, muscle weakness, tongue swelling   Sulfa Antibiotics Other (See Comments)    GI distress    Abilify [Aripiprazole] Rash   Penicillins Rash    She has taken amoxicillin without problems    Immunization History  Administered Date(s) Administered   Influenza,inj,Quad PF,6+ Mos 09/02/2018, 09/24/2019, 11/03/2020, 11/07/2022   PPD Test 05/10/2010   Pneumococcal Polysaccharide-23 05/14/2018   Tdap 06/30/2015   Zoster Recombinant(Shingrix) 02/14/2021    Past Medical History:  Diagnosis Date   Allergy    pollen   Anxiety    Arthritis    right hip   Bipolar 1 disorder (HCC)    Cancer (HCC)    rectal   Chemotherapy-induced neuropathy (HCC) 10/24/2022   Chronic kidney disease    COPD (chronic obstructive pulmonary disease) (HCC)    Depression    Family history of adverse reaction to anesthesia    grand father had a stroke during anesthesia   Family history of breast cancer    Family history of colon cancer    Family history of uterine cancer    GERD (gastroesophageal reflux disease)    History of kidney stones    Hyperlipidemia    Hypertension    Hypothyroidism    Panic attack    Pneumonia     Psoriasis    Sleep apnea 08/11/2021   No CPAP   Type 2 diabetes mellitus with microalbuminuria, without long-term current use of insulin (HCC) 06/24/2019    Tobacco History: Social History   Tobacco Use  Smoking Status Some Days   Packs/day: 0.25   Years: 34.00   Additional pack years: 0.00   Total pack years: 8.50   Types: Cigarettes   Start date: 05/13/1985   Passive exposure: Current  Smokeless Tobacco Never  Tobacco Comments   5-6 cigarettes weekly- 11/07/2022   Ready to quit: Not Answered Counseling given: Not Answered Tobacco comments: 5-6 cigarettes weekly- 11/07/2022   Outpatient Medications Prior to Visit  Medication Sig Dispense Refill   ACCU-CHEK GUIDE test strip USE UP TO FOUR TIMES DAILY AS DIRECTED. 100 each 5   acetaminophen (TYLENOL) 325 MG tablet Take 650 mg by mouth every 6 (six) hours as needed for headache (pain).     albuterol (PROVENTIL) (2.5 MG/3ML) 0.083% nebulizer solution Take 3 mLs (2.5 mg total) by nebulization every 6 (six) hours as needed for shortness of breath or wheezing. 75 mL 0   albuterol (VENTOLIN HFA) 108 (90 Base) MCG/ACT inhaler Inhale 2 puffs into the lungs every 6 (six) hours as needed for wheezing or shortness of breath. 1 each 5   amantadine (SYMMETREL) 100 MG capsule Take 100 mg by mouth 2 (two) times daily.     blood  glucose meter kit and supplies Dispense based on patient and insurance preference. Use up to four times daily as directed. (FOR ICD-10 E10.9, E11.9). 1 each 0   Budeson-Glycopyrrol-Formoterol (BREZTRI AEROSPHERE) 160-9-4.8 MCG/ACT AERO Inhale 2 puffs into the lungs in the morning and at bedtime. 5.9 g 11   buPROPion (WELLBUTRIN XL) 300 MG 24 hr tablet Take 300 mg by mouth daily.     Cholecalciferol (VITAMIN D-3) 125 MCG (5000 UT) TABS Take 5,000 Units by mouth daily. 30 tablet 1   clonazePAM (KLONOPIN) 0.5 MG tablet Take 0.5 mg by mouth daily.     dapagliflozin propanediol (FARXIGA) 10 MG TABS tablet Take 1 tablet (10 mg  total) by mouth daily before breakfast. 90 tablet 1   diltiazem (CARDIZEM CD) 120 MG 24 hr capsule Take 1 capsule (120 mg total) by mouth daily. 90 capsule 1   docusate sodium (COLACE) 100 MG capsule TAKE 1 CAPSULE BY MOUTH TWICE DAILY 60 capsule 1   fluconazole (DIFLUCAN) 150 MG tablet Take 1 tablet (150 mg total) by mouth every 3 (three) days as needed (for vaginal itching/yeast infection sx). 2 tablet 0   gabapentin (NEURONTIN) 300 MG capsule Take 300 mg by mouth 2 (two) times daily.     hydrocortisone cream 0.5 % Apply 1 Application topically 2 (two) times daily as needed for itching. 30 g 1   Hydrocortisone, Perianal, 1 % CREA Apply topically.     icosapent Ethyl (VASCEPA) 1 g capsule TAKE 2 CAPSULES BY MOUTH 2 TIMES DAILY 120 capsule 3   Insulin Pen Needle 32G X 4 MM MISC 1 each by Does not apply route daily at 12 noon. 100 each 0   ipratropium-albuterol (DUONEB) 0.5-2.5 (3) MG/3ML SOLN Take 3 mLs by nebulization every 6 (six) hours as needed. 360 mL 11   levothyroxine (SYNTHROID) 75 MCG tablet TAKE 1 TABLET BY MOUTH DAILY BEFORE BREAKFAST 90 tablet 2   lidocaine-prilocaine (EMLA) cream Apply to affected area once 30 g 3   loperamide (IMODIUM) 2 MG capsule Take 1 capsule (2 mg total) by mouth See admin instructions. Initial: 4 mg,the 2 mg every 2 hours (4 mg every 4 hours at night)  maximum: 16 mg/day 60 capsule 2   loratadine (CLARITIN) 10 MG tablet Take 10 mg by mouth every morning.     losartan (COZAAR) 25 MG tablet TAKE 1 TABLET BY MOUTH DAILY 30 tablet 3   NON FORMULARY Pt uses a cpap nightly     nystatin cream (MYCOSTATIN) Apply 1 Application topically 2 (two) times daily. 30 g 0   ondansetron (ZOFRAN) 8 MG tablet Take 1 tablet (8 mg total) by mouth every 8 (eight) hours as needed for nausea, vomiting or refractory nausea / vomiting. Start on the third day after chemotherapy. 30 tablet 1   oxybutynin (DITROPAN-XL) 10 MG 24 hr tablet Take 1 tablet (10 mg total) by mouth daily. 30 tablet  11   paliperidone (INVEGA SUSTENNA) 234 MG/1.5ML SUSY injection Inject 234 mg into the muscle every 30 (thirty) days. On or about the 14th of each month     pantoprazole (PROTONIX) 40 MG tablet TAKE 1 TABLET BY MOUTH DAILY 90 tablet 2   Pediatric Multivit-Minerals-C (CHEWABLES MULTIVITAMIN PO) Take 1 tablet by mouth daily.     pioglitazone (ACTOS) 15 MG tablet Take 1 tablet (15 mg total) by mouth daily. 90 tablet 1   promethazine (PHENERGAN) 12.5 MG tablet Take 1 tablet (12.5 mg total) by mouth every 6 (six) hours as  needed for nausea or vomiting. 30 tablet 0   rosuvastatin (CRESTOR) 40 MG tablet TAKE 1 TABLET BY MOUTH EVERY MORNING 30 tablet 3   sertraline (ZOLOFT) 100 MG tablet Take 200 mg by mouth every morning.     Spacer/Aero-Holding Chambers (AEROCHAMBER MV) inhaler Use as instructed 1 each 0   Facility-Administered Medications Prior to Visit  Medication Dose Route Frequency Provider Last Rate Last Admin   albuterol (PROVENTIL) (2.5 MG/3ML) 0.083% nebulizer solution 2.5 mg  2.5 mg Nebulization Once Raechel Chute, MD          Review of Systems  Review of Systems   Physical Exam  There were no vitals taken for this visit. Physical Exam   Lab Results:  CBC    Component Value Date/Time   WBC 8.2 06/08/2023 0913   RBC 4.13 06/08/2023 0913   HGB 13.7 06/08/2023 0913   HGB 13.7 06/05/2023 0941   HGB 13.2 04/20/2015 0640   HCT 40.7 06/08/2023 0913   HCT 39.2 04/20/2015 0640   PLT 143 (L) 06/08/2023 0913   PLT 144 (L) 06/05/2023 0941   PLT 165 04/20/2015 0640   MCV 98.5 06/08/2023 0913   MCV 96 04/20/2015 0640   MCH 33.2 06/08/2023 0913   MCHC 33.7 06/08/2023 0913   RDW 14.0 06/08/2023 0913   RDW 12.4 04/20/2015 0640   LYMPHSABS 0.8 06/08/2023 0913   LYMPHSABS 2.4 04/20/2015 0640   MONOABS 0.5 06/08/2023 0913   MONOABS 0.6 04/20/2015 0640   EOSABS 0.1 06/08/2023 0913   EOSABS 0.2 04/20/2015 0640   BASOSABS 0.0 06/08/2023 0913   BASOSABS 0.1 04/20/2015 0640     BMET    Component Value Date/Time   NA 140 06/08/2023 0913   NA 139 04/20/2015 0640   K 4.0 06/08/2023 0913   K 3.9 04/20/2015 0640   CL 104 06/08/2023 0913   CL 105 04/20/2015 0640   CO2 26 06/08/2023 0913   CO2 27 04/20/2015 0640   GLUCOSE 119 (H) 06/08/2023 0913   GLUCOSE 95 04/20/2015 0640   BUN 16 06/08/2023 0913   BUN 12 04/20/2015 0640   CREATININE 0.82 06/08/2023 0913   CREATININE 0.93 06/05/2023 0941   CREATININE 1.08 (H) 10/05/2022 1054   CALCIUM 8.8 (L) 06/08/2023 0913   CALCIUM 9.2 04/20/2015 0640   GFRNONAA >60 06/08/2023 0913   GFRNONAA >60 06/05/2023 0941   GFRNONAA 69 06/20/2021 1234   GFRAA 80 06/20/2021 1234    BNP No results found for: "BNP"  ProBNP No results found for: "PROBNP"  Imaging: DG Chest 2 View  Result Date: 06/20/2023 CLINICAL DATA:  Cough. EXAM: CHEST - 2 VIEW COMPARISON:  CT chest dated May 31, 2023. Chest x-ray dated September 28, 2022. FINDINGS: Unchanged right chest wall port catheter. The heart size and mediastinal contours are within normal limits. Normal pulmonary vascularity. No focal consolidation, pleural effusion, or pneumothorax. No acute osseous abnormality. IMPRESSION: No active cardiopulmonary disease. Electronically Signed   By: Obie Dredge M.D.   On: 06/20/2023 10:29   US BIOPSY (LIVER)  Result Date: 06/08/2023 INDICATION: liver mass Briefly, 52 year old female with a history of rectal cancer with new liver masses. EXAM: ULTRASOUND GUIDED LIVER MASS BIOPSY COMPARISON:  CT CAP, 05/31/2023. MEDICATIONS: 10 mL lidocaine 1% ANESTHESIA/SEDATION: Moderate (conscious) sedation was employed during this procedure. A total of Versed 1 mg and Fentanyl 50 mcg was administered intravenously. Moderate Sedation Time: 24 minutes. The patient's level of consciousness and vital signs were monitored continuously by radiology nursing  throughout the procedure under my direct supervision. COMPLICATIONS: None immediate. PROCEDURE: Informed  written consent was obtained from the patient and/or patient's representative after a discussion of the risks, benefits and alternatives to treatment. The patient understands and consents the procedure. A timeout was performed prior to the initiation of the procedure. Ultrasound scanning was performed of the right upper abdominal quadrant demonstrates small RIGHT hepatic lobe masses. The RIGHT hepatic lobe mass was selected for biopsy and the procedure was planned. The right upper abdominal quadrant was prepped and draped in the usual sterile fashion. The overlying soft tissues were anesthetized with 1% lidocaine with epinephrine. A 17 gauge, 6.8 cm co-axial needle was advanced into a peripheral aspect of the lesion. This was followed by 4 core biopsies with an 18 gauge core device under direct ultrasound guidance. The coaxial needle tract was embolized with a small amount of Gel-Foam slurry and superficial hemostasis was obtained with manual compression. Post procedural scanning was negative for definitive area of hemorrhage or additional complication. A dressing was placed. The patient tolerated the procedure well without immediate post procedural complication. IMPRESSION: Successful core needle biopsy of a RIGHT hepatic lobe mass. Roanna Banning, MD [facility] Electronically Signed   By: Roanna Banning M.D.   On: 06/08/2023 11:01   CT CHEST ABDOMEN PELVIS W CONTRAST  Result Date: 05/31/2023 CLINICAL DATA:  History of rectal cancer, follow-up/restaging. * Tracking Code: BO * EXAM: CT CHEST, ABDOMEN, AND PELVIS WITH CONTRAST TECHNIQUE: Multidetector CT imaging of the chest, abdomen and pelvis was performed following the standard protocol during bolus administration of intravenous contrast. RADIATION DOSE REDUCTION: This exam was performed according to the departmental dose-optimization program which includes automated exposure control, adjustment of the mA  and/or kV according to patient size and/or use of iterative reconstruction technique. CONTRAST:  OMNIPAQUE IOHEXOL 300 MG/ML  SOLN COMPARISON:  Multiple priors including most recent CT December 21, 2022 FINDINGS: CT CHEST FINDINGS Cardiovascular: Right chest Port-A-Cath with tip near the superior cavoatrial junction. Normal caliber thoracic aorta. No central pulmonary embolus on this nondedicated study. Normal size heart. No significant pericardial effusion/thickening. Mediastinum/Nodes: No suspicious thyroid nodule. No pathologically enlarged mediastinal, hilar or axillary lymph nodes. Reflux versus retained contrast in the esophagus. Lungs/Pleura: Small bilateral pulmonary nodules, some of which were subtly evident on prior examination only in retrospect, some of which demonstrate degree of cavitation. For reference: -solid 4 mm right middle lobe pulmonary nodule on image 77/3 previously 2 mm. -solid centrally cavitary pulmonary nodule in the left upper lobe measuring 6 mm on image 52/3 previously 3 mm.-partially cavitary subpleural left lower lobe pulmonary nodule now measuring 5 mm on image 74/3 previously 2-3 mm. No pleural effusion.  No pneumothorax. Musculoskeletal: No aggressive lytic or blastic lesion of bone. CT ABDOMEN PELVIS FINDINGS Hepatobiliary: New hypodense 16 mm lesion in the right lobe of the liver on image 51/4. Very subtle adjacent 5 mm lesion on image 51/2. Gallbladder is unremarkable. No biliary ductal dilation. Pancreas: No pancreatic ductal dilation or evidence of acute inflammation. Spleen: No splenomegaly or focal splenic lesion. Adrenals/Urinary Tract: Bilateral adrenal glands appear normal. No hydronephrosis. Left renal parenchymal atrophy/scarring. Multiple nonobstructive left renal calculi measure under 1 cm. Multiple small benign renal cysts are again seen bilaterally. Urinary bladder is unremarkable for degree of distension. Stomach/Bowel: Radiopaque enteric contrast material  traverses the stomach. No pathologic dilation of small or large bowel. Large volume of formed stool in the colon. Left anterior abdominal  wall end colostomy. 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 Similar small amount of perirectal/presacral soft tissue and fluid. Vascular/Lymphatic: Normal caliber abdominal aorta. Smooth IVC contours. Prominent retroperitoneal lymph nodes are similar prior for instance the previously indexed left periaortic lymph node measures 9 mm on image 64/2, unchanged. Reproductive: Uterus and bilateral adnexa are unremarkable. Other: No significant abdominopelvic free fluid. No discrete peritoneal or mental nodularity. Soft tissue nodularity in the posterior gluteal subcutaneous fat for instance on image 88/2 is unchanged. Musculoskeletal: Postradiation change in the sacrum. Hyperdense focus in the S2 vertebral body is stable from prior studies and likely reflects a bone island. IMPRESSION: 1. New hypodense 16 mm lesion in the right lobe of the liver with very subtle adjacent 5 mm lesion, suspicious for metastatic disease. 2. Small bilateral pulmonary nodules, some of which were subtly evident on prior examination only in retrospect, some of which demonstrate degree of cavitation, suspicious for metastatic disease. 3. Prominent retroperitoneal lymph nodes are similar prior. 4. Prior low anterior resection with Hartmann's pouch formation, similar small amount of perirectal/presacral soft tissue and fluid, favored postsurgical/posttreatment change. Suggest continued attention on follow-up imaging. 5. Large volume of formed stool in the colon. 6. Nonobstructive left renal calculi measure under 1 cm. These results will be called to the ordering clinician or representative by the Radiologist Assistant, and communication documented in the PACS or [employer]. Electronically Signed   By: Maudry Mayhew M.D.   On: 05/31/2023 16:52     Assessment & Plan:   No  problem-specific Assessment & Plan notes found for this encounter.     Glenford Bayley, NP 06/25/2023

## 2023-06-25 NOTE — Addendum Note (Signed)
Addended by: [name] on: 06/25/2023 03:34 PM   Modules accepted: Orders

## 2023-06-25 NOTE — Assessment & Plan Note (Signed)
Grade 1, stabe 

## 2023-06-26 ENCOUNTER — Other Ambulatory Visit: Payer: Self-pay | Admitting: Radiology

## 2023-06-26 ENCOUNTER — Ambulatory Visit: Payer: 59

## 2023-06-26 DIAGNOSIS — C20 Malignant neoplasm of rectum: Secondary | ICD-10-CM

## 2023-06-27 ENCOUNTER — Inpatient Hospital Stay: Payer: 59

## 2023-06-27 ENCOUNTER — Ambulatory Visit
Admission: RE | Admit: 2023-06-27 | Discharge: 2023-06-27 | Disposition: A | Discharge status: Home / Self Care | Payer: 59 | Source: Ambulatory Visit | Attending: Oncology | Admitting: Oncology

## 2023-06-27 ENCOUNTER — Inpatient Hospital Stay: Payer: 59 | Admitting: Licensed Clinical Social Worker

## 2023-06-27 ENCOUNTER — Other Ambulatory Visit: Payer: 59

## 2023-06-27 ENCOUNTER — Ambulatory Visit: Payer: 59

## 2023-06-27 DIAGNOSIS — T82828A Fibrosis of vascular prosthetic devices, implants and grafts, initial encounter: Secondary | ICD-10-CM | POA: Diagnosis not present

## 2023-06-27 DIAGNOSIS — Z452 Encounter for adjustment and management of vascular access device: Secondary | ICD-10-CM | POA: Diagnosis not present

## 2023-06-27 DIAGNOSIS — T82598A Other mechanical complication of other cardiac and vascular devices and implants, initial encounter: Secondary | ICD-10-CM | POA: Diagnosis not present

## 2023-06-27 DIAGNOSIS — Z95828 Presence of other vascular implants and grafts: Secondary | ICD-10-CM

## 2023-06-27 HISTORY — PX: IR REMOVE CV FIBRIN SHEATH: IMG699

## 2023-06-27 MED ORDER — LIDOCAINE HCL 1 % IJ SOLN
INTRAMUSCULAR | Status: AC
  Filled 2023-06-27: qty 20

## 2023-06-27 MED ORDER — HEPARIN SOD (PORK) LOCK FLUSH 100 UNIT/ML IV SOLN
INTRAVENOUS | Status: AC
  Filled 2023-06-27: qty 5

## 2023-06-27 MED ORDER — IOHEXOL 300 MG/ML  SOLN
6.0000 mL | Freq: Once | INTRAMUSCULAR | Status: AC | PRN
  Administered 2023-06-27: 6 mL

## 2023-06-27 MED ORDER — HEPARIN SOD (PORK) LOCK FLUSH 100 UNIT/ML IV SOLN
500.0000 [IU] | Freq: Once | INTRAVENOUS | Status: AC
  Administered 2023-06-27: 500 [IU] via INTRAVENOUS

## 2023-06-27 MED ORDER — LIDOCAINE HCL 1 % IJ SOLN
5.0000 mL | Freq: Once | INTRAMUSCULAR | Status: AC
  Administered 2023-06-27: 5 mL via INTRADERMAL

## 2023-06-27 NOTE — Progress Notes (Signed)
[facility] CSW Progress Note  Clinical Child psychotherapist returned patient's call.  She inquired about the [employer].  Provided her with Mady Haagensen, Financial Counselor, contact information.  Ethell stated her son recently turned 51 y/o and her disability amount was significantly reduced.  She is attempting to secure an apartment for them.    Dorothey Baseman, LCSW Clinical Social Worker [facility]

## 2023-06-29 ENCOUNTER — Inpatient Hospital Stay: Payer: 59

## 2023-06-29 MED FILL — Dexamethasone Sodium Phosphate Inj 100 MG/10ML: INTRAMUSCULAR | Qty: 1 | Status: AC

## 2023-06-29 NOTE — Progress Notes (Signed)
Spoke with April Luna, she will be in today to sign the paperwork for the [employer] and provide proof of income.

## 2023-07-02 ENCOUNTER — Inpatient Hospital Stay: Payer: 59

## 2023-07-02 ENCOUNTER — Encounter: Payer: Self-pay | Admitting: Oncology

## 2023-07-02 ENCOUNTER — Inpatient Hospital Stay (HOSPITAL_BASED_OUTPATIENT_CLINIC_OR_DEPARTMENT_OTHER): Payer: 59 | Admitting: Oncology

## 2023-07-02 VITALS — BP 101/76 | HR 79 | Temp 96.7°F | Resp 18 | Wt 139.6 lb

## 2023-07-02 VITALS — BP 118/80 | HR 74

## 2023-07-02 DIAGNOSIS — E1122 Type 2 diabetes mellitus with diabetic chronic kidney disease: Secondary | ICD-10-CM | POA: Diagnosis not present

## 2023-07-02 DIAGNOSIS — C20 Malignant neoplasm of rectum: Secondary | ICD-10-CM | POA: Diagnosis not present

## 2023-07-02 DIAGNOSIS — Z5111 Encounter for antineoplastic chemotherapy: Secondary | ICD-10-CM | POA: Diagnosis not present

## 2023-07-02 DIAGNOSIS — N200 Calculus of kidney: Secondary | ICD-10-CM | POA: Diagnosis not present

## 2023-07-02 DIAGNOSIS — E039 Hypothyroidism, unspecified: Secondary | ICD-10-CM | POA: Diagnosis not present

## 2023-07-02 DIAGNOSIS — G4730 Sleep apnea, unspecified: Secondary | ICD-10-CM | POA: Diagnosis not present

## 2023-07-02 DIAGNOSIS — I700 Atherosclerosis of aorta: Secondary | ICD-10-CM | POA: Diagnosis not present

## 2023-07-02 DIAGNOSIS — G620 Drug-induced polyneuropathy: Secondary | ICD-10-CM

## 2023-07-02 DIAGNOSIS — T451X5A Adverse effect of antineoplastic and immunosuppressive drugs, initial encounter: Secondary | ICD-10-CM

## 2023-07-02 DIAGNOSIS — E1140 Type 2 diabetes mellitus with diabetic neuropathy, unspecified: Secondary | ICD-10-CM | POA: Diagnosis not present

## 2023-07-02 DIAGNOSIS — D122 Benign neoplasm of ascending colon: Secondary | ICD-10-CM | POA: Diagnosis not present

## 2023-07-02 DIAGNOSIS — Z7984 Long term (current) use of oral hypoglycemic drugs: Secondary | ICD-10-CM | POA: Diagnosis not present

## 2023-07-02 DIAGNOSIS — Z87442 Personal history of urinary calculi: Secondary | ICD-10-CM | POA: Diagnosis not present

## 2023-07-02 DIAGNOSIS — Z5189 Encounter for other specified aftercare: Secondary | ICD-10-CM | POA: Diagnosis not present

## 2023-07-02 DIAGNOSIS — F17210 Nicotine dependence, cigarettes, uncomplicated: Secondary | ICD-10-CM | POA: Diagnosis not present

## 2023-07-02 DIAGNOSIS — R197 Diarrhea, unspecified: Secondary | ICD-10-CM | POA: Diagnosis not present

## 2023-07-02 DIAGNOSIS — I129 Hypertensive chronic kidney disease with stage 1 through stage 4 chronic kidney disease, or unspecified chronic kidney disease: Secondary | ICD-10-CM | POA: Diagnosis not present

## 2023-07-02 DIAGNOSIS — Z800 Family history of malignant neoplasm of digestive organs: Secondary | ICD-10-CM | POA: Diagnosis not present

## 2023-07-02 DIAGNOSIS — E785 Hyperlipidemia, unspecified: Secondary | ICD-10-CM | POA: Diagnosis not present

## 2023-07-02 DIAGNOSIS — Z79899 Other long term (current) drug therapy: Secondary | ICD-10-CM | POA: Diagnosis not present

## 2023-07-02 DIAGNOSIS — K219 Gastro-esophageal reflux disease without esophagitis: Secondary | ICD-10-CM | POA: Diagnosis not present

## 2023-07-02 DIAGNOSIS — Z803 Family history of malignant neoplasm of breast: Secondary | ICD-10-CM | POA: Diagnosis not present

## 2023-07-02 LAB — CBC WITH DIFFERENTIAL (CANCER CENTER ONLY)
Abs Immature Granulocytes: 0.03 10*3/uL (ref 0.00–0.07)
Basophils Absolute: 0.1 10*3/uL (ref 0.0–0.1)
Basophils Relative: 1 %
Eosinophils Absolute: 0.2 10*3/uL (ref 0.0–0.5)
Eosinophils Relative: 3 %
HCT: 42.9 % (ref 36.0–46.0)
Hemoglobin: 14.4 g/dL (ref 12.0–15.0)
Immature Granulocytes: 0 %
Lymphocytes Relative: 10 %
Lymphs Abs: 0.7 10*3/uL (ref 0.7–4.0)
MCH: 32.8 pg (ref 26.0–34.0)
MCHC: 33.6 g/dL (ref 30.0–36.0)
MCV: 97.7 fL (ref 80.0–100.0)
Monocytes Absolute: 0.5 10*3/uL (ref 0.1–1.0)
Monocytes Relative: 7 %
Neutro Abs: 5.7 10*3/uL (ref 1.7–7.7)
Neutrophils Relative %: 79 %
Platelet Count: 148 10*3/uL — ABNORMAL LOW (ref 150–400)
RBC: 4.39 MIL/uL (ref 3.87–5.11)
RDW: 14 % (ref 11.5–15.5)
WBC Count: 7.2 10*3/uL (ref 4.0–10.5)
nRBC: 0 % (ref 0.0–0.2)

## 2023-07-02 LAB — CMP (CANCER CENTER ONLY)
ALT: 12 U/L (ref 0–44)
AST: 18 U/L (ref 15–41)
Albumin: 3.9 g/dL (ref 3.5–5.0)
Alkaline Phosphatase: 77 U/L (ref 38–126)
Anion gap: 7 (ref 5–15)
BUN: 19 mg/dL (ref 6–20)
CO2: 28 mmol/L (ref 22–32)
Calcium: 8.8 mg/dL — ABNORMAL LOW (ref 8.9–10.3)
Chloride: 102 mmol/L (ref 98–111)
Creatinine: 0.9 mg/dL (ref 0.44–1.00)
GFR, Estimated: >60 mL/min (ref >60)
Glucose, Bld: 169 mg/dL — ABNORMAL HIGH (ref 70–99)
Potassium: 4.3 mmol/L (ref 3.5–5.1)
Sodium: 137 mmol/L (ref 135–145)
Total Bilirubin: 0.3 mg/dL (ref 0.3–1.2)
Total Protein: 7.1 g/dL (ref 6.5–8.1)

## 2023-07-02 MED ORDER — SODIUM CHLORIDE 0.9 % IV SOLN
10.0000 mg | Freq: Once | INTRAVENOUS | Status: AC
  Administered 2023-07-02: 10 mg via INTRAVENOUS
  Filled 2023-07-02: qty 1
  Filled 2023-07-02: qty 10

## 2023-07-02 MED ORDER — PALONOSETRON HCL INJECTION 0.25 MG/5ML
0.2500 mg | Freq: Once | INTRAVENOUS | Status: AC
  Administered 2023-07-02: 0.25 mg via INTRAVENOUS
  Filled 2023-07-02: qty 5

## 2023-07-02 MED ORDER — SODIUM CHLORIDE 0.9 % IV SOLN
5.0000 mg/kg | Freq: Once | INTRAVENOUS | Status: AC
  Administered 2023-07-02: 300 mg via INTRAVENOUS
  Filled 2023-07-02: qty 12

## 2023-07-02 MED ORDER — FLUOROURACIL CHEMO INJECTION 2.5 GM/50ML
400.0000 mg/m2 | Freq: Once | INTRAVENOUS | Status: AC
  Administered 2023-07-02: 650 mg via INTRAVENOUS
  Filled 2023-07-02: qty 13

## 2023-07-02 MED ORDER — SODIUM CHLORIDE 0.9 % IV SOLN
180.0000 mg/m2 | Freq: Once | INTRAVENOUS | Status: AC
  Administered 2023-07-02: 300 mg via INTRAVENOUS
  Filled 2023-07-02: qty 15

## 2023-07-02 MED ORDER — SODIUM CHLORIDE 0.9 % IV SOLN
2400.0000 mg/m2 | INTRAVENOUS | Status: DC
  Administered 2023-07-02: 3850 mg via INTRAVENOUS
  Filled 2023-07-02: qty 77

## 2023-07-02 MED ORDER — SODIUM CHLORIDE 0.9 % IV SOLN
400.0000 mg/m2 | Freq: Once | INTRAVENOUS | Status: DC
  Filled 2023-07-02: qty 32.2

## 2023-07-02 MED ORDER — SODIUM CHLORIDE 0.9 % IV SOLN
400.0000 mg/m2 | Freq: Once | INTRAVENOUS | Status: AC
  Administered 2023-07-02: 644 mg via INTRAVENOUS
  Filled 2023-07-02: qty 32.2

## 2023-07-02 MED ORDER — SODIUM CHLORIDE 0.9 % IV SOLN
Freq: Once | INTRAVENOUS | Status: AC
  Filled 2023-07-02: qty 250

## 2023-07-02 MED ORDER — ATROPINE SULFATE 1 MG/ML IV SOLN
0.5000 mg | Freq: Once | INTRAVENOUS | Status: AC | PRN
  Administered 2023-07-02: 0.5 mg via INTRAVENOUS
  Filled 2023-07-02: qty 1

## 2023-07-02 NOTE — Assessment & Plan Note (Addendum)
History of Stage III. pT3 pN1b cM0, s/p APR. Currently on adjuvant chemotherapy. S/p FOLFOX x 4 cycles S/p concurrent chemotherapy [Xeloda 1300 mg BID] with RT, and then another 4 cycles of adjuvant FOLFOX [dose reduced oxaliplatin and omit 5-FU bolus CT imaging indicates disease progression.  Ultrasound-guided liver biopsy pathology showed metastatic carcinoma consistent with history of rectal adenocarcinoma.  CEA is elevated. Discussed with patient about stage IV current rectal adenocarcinoma I recommend checking [facility] NGS Recommend systemic chemotherapy FOLFIRI Plus bevacizumab. Labs are reviewed and discussed with patient. Proceed C1 today.

## 2023-07-02 NOTE — Progress Notes (Signed)
Pt here for follow up. No new concerns voiced.   

## 2023-07-02 NOTE — Assessment & Plan Note (Signed)
Chemotherapy plan as listed above 

## 2023-07-02 NOTE — Patient Instructions (Signed)
[facility]  Discharge Instructions: Thank you for choosing [facility] to provide your oncology and hematology care.  If you have a lab appointment with the Cancer Center, please go directly to the Cancer Center and check in at the registration area.  Wear comfortable clothing and clothing appropriate for easy access to any Portacath or PICC line.   We strive to give you quality time with your provider. You may need to reschedule your appointment if you arrive late (15 or more minutes).  Arriving late affects you and other patients whose appointments are after yours.  Also, if you miss three or more appointments without notifying the office, you may be dismissed from the clinic at the provider's discretion.      For prescription refill requests, have your pharmacy contact our office and allow 72 hours for refills to be completed.    Today you received the following chemotherapy and/or immunotherapy agents AVastin, Irinotecan, leucovorin, Adrucil,    To help prevent nausea and vomiting after your treatment, we encourage you to take your nausea medication as directed.  BELOW ARE SYMPTOMS THAT SHOULD BE REPORTED IMMEDIATELY: *FEVER GREATER THAN 100.4 F (38 C) OR HIGHER *CHILLS OR SWEATING *NAUSEA AND VOMITING THAT IS NOT CONTROLLED WITH YOUR NAUSEA MEDICATION *UNUSUAL SHORTNESS OF BREATH *UNUSUAL BRUISING OR BLEEDING *URINARY PROBLEMS (pain or burning when urinating, or frequent urination) *BOWEL PROBLEMS (unusual diarrhea, constipation, pain near the anus) TENDERNESS IN MOUTH AND THROAT WITH OR WITHOUT PRESENCE OF ULCERS (sore throat, sores in mouth, or a toothache) UNUSUAL RASH, SWELLING OR PAIN  UNUSUAL VAGINAL DISCHARGE OR ITCHING   Items with * indicate a potential emergency and should be followed up as soon as possible or go to the Emergency Department if any problems should occur.  Please show the CHEMOTHERAPY ALERT CARD or  IMMUNOTHERAPY ALERT CARD at check-in to the Emergency Department and triage nurse.  Should you have questions after your visit or need to cancel or reschedule your appointment, please contact [facility]  [phone] and follow the prompts.  Office hours are 8:00 a.m. to 4:30 p.m. Monday - Friday. Please note that voicemails left after 4:00 p.m. may not be returned until the following business day.  We are closed weekends and major holidays. You have access to a nurse at all times for urgent questions. Please call the main number to the clinic [phone] and follow the prompts.  For any non-urgent questions, you may also contact your provider using MyChart. We now offer e-Visits for anyone 12 and older to request care online for non-urgent symptoms. For details visit [email or web address].   Also download the MyChart app! Go to the app store, search "MyChart", open the app, select [facility], and log in with your MyChart username and password.

## 2023-07-02 NOTE — Assessment & Plan Note (Signed)
Grade 1, stabe 

## 2023-07-02 NOTE — Progress Notes (Signed)
Hematology/Oncology Progress note [phone] [phone]      CHIEF COMPLAINTS/REASON FOR VISIT:  Follow-up for rectal cancer treatments.  ASSESSMENT & PLAN:   Cancer Staging  Rectal cancer [facility]) Staging form: Colon and Rectum, AJCC 8th Edition - Pathologic stage from 05/03/2022: Stage IIIB (pT3, pN1b, cM0) - Signed by [name], MD on 05/03/2022 - Pathologic stage from 06/18/2023: Stage IVA (rpTX, pN0, pM1a) - Signed by [name], MD on 06/18/2023   Rectal cancer [facility]) History of Stage III. pT3 pN1b cM0, s/p APR. Currently on adjuvant chemotherapy. S/p FOLFOX x 4 cycles S/p concurrent chemotherapy [Xeloda 1300 mg BID] with RT, and then another 4 cycles of adjuvant FOLFOX [dose reduced oxaliplatin and omit 5-FU bolus CT imaging indicates disease progression.  Ultrasound-guided liver biopsy pathology showed metastatic carcinoma consistent with history of rectal adenocarcinoma.  CEA is elevated. Discussed with patient about stage IV current rectal adenocarcinoma I recommend checking [facility] NGS Recommend systemic chemotherapy FOLFIRI Plus bevacizumab. Labs are reviewed and discussed with patient. Proceed C1 today.      Encounter for antineoplastic chemotherapy Chemotherapy plan as listed above  Chemotherapy-induced neuropathy (HCC) Grade 1, stabe  Patient has been referred to Child psychotherapist.   No orders of the defined types were placed in this encounter.   Follow-up 2 weeks lab MD chemo  All questions were answered. The patient knows to call the clinic with any problems, questions or concerns.  [name], MD, PhD [facility] Hematology Oncology 07/02/2023      HISTORY OF PRESENTING ILLNESS:   April Luna is a  52 y.o.  female presents for treatment of rectal cancer Oncology History  Rectal cancer (HCC)  02/26/2022 Imaging   MRI PELVIS WITHOUT CONTRAST- By imaging, rectal cancer stage:  T1/T2, N0, Mx    03/02/2022 Imaging   CT CHEST AND  ABDOMEN WITH CONTRAST 1. No convincing evidence of metastatic disease within the chest or abdomen. 2. Atrophic left kidney with multifocal renal scarring and cortical calcifications as well as nonobstructive renal stones measuring up to 5 mm. 3. Prominent left-sided predominant retroperitoneal lymph nodes measuring up to 8 mm near the level of the renal hilum, overall decreased in size dating back to CT September 19, 2018 and favored reactive related to left renal inflammation. 4.  Aortic Atherosclerosis (ICD10-I70.0).   03/27/2022 Genetic Testing    [facility] CustomNext+RNA cancer panel found no pathogenic mutations.    04/21/2022 Initial Diagnosis   Rectal cancer - baseline CEA 3.6 -02/09/2022, patient had colonoscopy which showed renal mass 10 cm from anal verge.  5 mm polyp in ascending colon.  Removed and retrieved. Pathology showed rectal adenocarcinoma.  The polyp in the ascending colon is a tubular adenoma.  MRI showed cT1/T2N0 disease  -04/21/2022, patient underwent robotic assisted ultralow anterior resection. Pathology showed moderately differentiated adenocarcinoma, 4.5 cm in maximal extent, with focal extension through muscularis propria into perirectal soft tissue.  3 lymph nodes positive for metastatic carcinoma.  Negative margin.  pT3 pN1b, MSI stable.   05/03/2022 Cancer Staging   Staging form: Colon and Rectum, AJCC 8th Edition - Pathologic stage from 05/03/2022: Stage IIIB (pT3, pN1b, cM0) - Signed by [name], MD on 05/03/2022 Stage prefix: Initial diagnosis   05/11/2022 Miscellaneous   Medi port placed by Dr.White   05/26/2022 -  Chemotherapy   FOLFOX Q2 weeks x 4   05/26/2022 - 07/09/2022 Chemotherapy   Patient is on Treatment Plan : COLORECTAL FOLFOX q14d x 8 cycles  05/26/2022 - 12/13/2022 Chemotherapy   Patient is on Treatment Plan : COLORECTAL FOLFOX q14d x 4 months     07/27/2022 -  Chemotherapy   Concurrent chemotherapy- Xeloda  1300mg  BID and radiation.    07/27/2022 -  09/12/2022 Radiation Therapy    concurrent chemotherapy [Xeloda 1300 mg BID] with RT    09/20/2022 Imaging   CT Angiogram chest PET protocol There is no evidence of pulmonary artery embolism. There is no evidence of thoracic aortic dissection.   Small linear patchy alveolar infiltrate is seen in medial segment of right middle lobe suggesting atelectasis/pneumonia.     09/28/2022 - 09/30/2022 Hospital Admission   Admission due to acute respiratory failure with hypoxia, COPD exacerbation   09/28/2022 Imaging   CT angio chest PE protocol 1. Negative for acute PE or thoracic aortic dissection. 2. New ground-glass infiltrates anteriorly in bilateral upper lobes,may represent atypical edema, infectious or inflammatory process. 3.  Aortic Atherosclerosis     12/22/2022 Imaging   CT chest abdomen pelvis w contrast  Stable exam. No evidence of recurrent or metastatic carcinoma within the chest, abdomen, or pelvis.   Left nephrolithiasis and renal parenchymal atrophy. No evidence of ureteral calculi or hydronephrosis.   Aortic Atherosclerosis   05/31/2023 Imaging   CT chest abdomen pelvis with contrast showed 1. New hypodense 16 mm lesion in the right lobe of the liver with very subtle adjacent 5 mm lesion, suspicious for metastatic disease. 2. Small bilateral pulmonary nodules, some of which were subtly evident on prior examination only in retrospect, some of which demonstrate degree of cavitation, suspicious for metastatic disease. 3. Prominent retroperitoneal lymph nodes are similar prior. 4. Prior low anterior resection with Hartmann's pouch formation,similar small amount of perirectal/presacral soft tissue and fluid,favored postsurgical/posttreatment change. Suggest continued attention on follow-up imaging. 5. Large volume of formed stool in the colon. 6. Nonobstructive left renal calculi measure under 1 cm.     06/08/2023 Procedure   Ultrasound-guided liver biopsy showed Pathology  showed metastatic carcinoma, morphology consistent with patient's clinical history of rectal adenocarcinoma.   06/18/2023 Cancer Staging   Staging form: Colon and Rectum, AJCC 8th Edition - Pathologic stage from 06/18/2023: Stage IVA (rpTX, pN0, pM1a) - Signed by [name], MD on 06/18/2023 Stage prefix: Recurrence Total positive nodes: 0   06/25/2023 - 06/25/2023 Chemotherapy   Patient is on Treatment Plan : COLORECTAL FOLFOX + Bevacizumab q14d     07/02/2023 -  Chemotherapy   Patient is on Treatment Plan : COLORECTAL FOLFIRI + Bevacizumab q14d     Patient has bipolar and schizophrenia..  Patient is married  She has housing issue due to previous history of eviction.  she does not live with her husband currently. She lives with some family members.   INTERVAL HISTORY NAKEYIA MARNER is a 52 y.o. female who has above history reviewed by me today presents for follow up visit for rectal cancer.  She feels well today.  Denies any melena or blood in the stool. Stable neuropathy symptoms. Medi port malfunction, s/p Fibrin sheath removal.     Review of Systems  Constitutional:  Positive for fatigue. Negative for appetite change, chills and fever.  HENT:   Negative for hearing loss and voice change.   Eyes:  Negative for eye problems.  Respiratory:  Negative for chest tightness, cough and shortness of breath.   Cardiovascular:  Negative for chest pain.  Gastrointestinal:  Negative for abdominal distention, abdominal pain and blood in stool.       +  colostomy  Endocrine: Negative for hot flashes.  Genitourinary:  Negative for difficulty urinating and frequency.   Musculoskeletal:  Negative for arthralgias.  Skin:  Negative for itching and rash.  Neurological:  Positive for numbness. Negative for extremity weakness.  Hematological:  Negative for adenopathy.  Psychiatric/Behavioral:  Negative for confusion.     MEDICAL HISTORY:  Past Medical History:  Diagnosis Date   Allergy    pollen    Anxiety    Arthritis    right hip   Bipolar 1 disorder (HCC)    Cancer (HCC)    rectal   Chemotherapy-induced neuropathy (HCC) 10/24/2022   Chronic kidney disease    COPD (chronic obstructive pulmonary disease) (HCC)    Depression    Family history of adverse reaction to anesthesia    grand father had a stroke during anesthesia   Family history of breast cancer    Family history of colon cancer    Family history of uterine cancer    GERD (gastroesophageal reflux disease)    History of kidney stones    Hyperlipidemia    Hypertension    Hypothyroidism    Panic attack    Pneumonia    Psoriasis    Sleep apnea 08/11/2021   No CPAP   Type 2 diabetes mellitus with microalbuminuria, without long-term current use of insulin (HCC) 06/24/2019    SURGICAL HISTORY: Past Surgical History:  Procedure Laterality Date   BREAST BIOPSY Left 12/14/2021   [place] bx, venus marker, path pending   CESAREAN SECTION     COLONOSCOPY WITH PROPOFOL N/A 02/09/2022   Procedure: COLONOSCOPY WITH PROPOFOL;  Surgeon: Toney Reil, MD;  Location: [facility];  Service: Endoscopy;  Laterality: N/A;  sleep apnea   CYSTOSCOPY W/ RETROGRADES Left 11/08/2018   Procedure: CYSTOSCOPY WITH RETROGRADE PYELOGRAM;  Surgeon: Sondra Come, MD;  Location: [facility];  Service: Urology;  Laterality: Left;   CYSTOSCOPY/URETEROSCOPY/HOLMIUM LASER/STENT PLACEMENT Left 11/08/2018   Procedure: CYSTOSCOPY/URETEROSCOPY/HOLMIUM LASER/STENT PLACEMENT;  Surgeon: Sondra Come, MD;  Location: [facility] OR[facility];  Service: Urology;  Laterality: Left;   IR CV LINE INJECTION  06/25/2023   IR IMAGING GUIDED PORT INSERTION  05/11/2022   IR REMOVE CV FIBRIN SHEATH  06/27/2023   MOUTH SURGERY     wisdom teeth extraction   MOUTH SURGERY     teeth removal   POLYPECTOMY N/A 02/09/2022   Procedure: POLYPECTOMY;  Surgeon: Toney Reil, MD;  Location: [facility];  Service: Endoscopy;  Laterality: N/A;   XI ROBOTIC  ASSISTED LOWER ANTERIOR RESECTION N/A 04/21/2022   Procedure: XI ROBOTIC ASSISTED LOWER ANTERIOR RESECTION WITH COLOSTOMY, BILATERAL TAP BLOCK, ASSESSMENT OF TISSUE PERFUSSION WITH FIREFLY INJECTION;  Surgeon: Andria Meuse, MD;  Location: [facility];  Service: General;  Laterality: N/A;    SOCIAL HISTORY: Social History   Socioeconomic History   Marital status: Married    Spouse name: Lesle Chris   Number of children: 1   Years of education: 12   Highest education level: High school graduate  Occupational History   Occupation: unemployed    Comment: disabled  Tobacco Use   Smoking status: Some Days    Packs/day: 0.25    Years: 34.00    Additional pack years: 0.00    Total pack years: 8.50    Types: Cigarettes    Start date: 05/13/1985    Passive exposure: Current   Smokeless tobacco: Never   Tobacco comments:    5-6 cigarettes weekly- 11/07/2022  Vaping Use   Vaping Use: Former   Start date: 05/25/2018   Quit date: 09/24/2018  Substance and Sexual Activity   Alcohol use: Not Currently    Alcohol/week: 4.0 standard drinks of alcohol    Types: 4 Glasses of wine per week    Comment: quit ETOH in Feb. 2023   Drug use: Not Currently    Types: Marijuana    Comment: 2 days ago   Sexual activity: Not Currently    Birth control/protection: Post-menopausal  Other Topics Concern   Not on file  Social History Narrative   Lives with husband and son    Social Determinants of Health   Financial Resource Strain: High Risk (08/25/2022)   Overall Financial Resource Strain (CARDIA)    Difficulty of Paying Living Expenses: Hard  Food Insecurity: Food Insecurity Present (06/06/2023)   Hunger Vital Sign    Worried About Running Out of Food in the Last Year: Sometimes true    Ran Out of Food in the Last Year: Sometimes true  Transportation Needs: No Transportation Needs (06/06/2023)   PRAPARE - Administrator, Civil Service (Medical): No    Lack of Transportation  (Non-Medical): No  Recent Concern: Transportation Needs - Unmet Transportation Needs (03/15/2023)   PRAPARE - Transportation    Lack of Transportation (Medical): Yes    Lack of Transportation (Non-Medical): Yes  Physical Activity: Insufficiently Active (08/25/2022)   Exercise Vital Sign    Days of Exercise per Week: 1 day    Minutes of Exercise per Session: 10 min  Stress: Stress Concern Present (08/25/2022)   [employer] of Occupational Health - Occupational Stress Questionnaire    Feeling of Stress : To some extent  Social Connections: Moderately Integrated (08/25/2022)   Social Connection and Isolation Panel [NHANES]    Frequency of Communication with Friends and Family: More than three times a week    Frequency of Social Gatherings with Friends and Family: Twice a week    Attends Religious Services: Never    Database administrator or Organizations: No    Attends Engineer, structural: 1 to 4 times per year    Marital Status: Married  Catering manager Violence: Not At Risk (09/20/2022)   Humiliation, Afraid, Rape, and Kick questionnaire    Fear of Current or Ex-Partner: No    Emotionally Abused: No    Physically Abused: No    Sexually Abused: No    FAMILY HISTORY: Family History  Problem Relation Age of Onset   Depression Mother    Anxiety disorder Mother    Diabetes Mother    Hypertension Mother    Hyperlipidemia Mother    Cancer Mother    Uterine cancer Mother 49   Cervical cancer Mother 27   Colon cancer Father    Depression Brother    Anxiety disorder Brother    Cancer Maternal Aunt        unk types   Diabetes Mellitus II Maternal Grandmother    Hypercholesterolemia Maternal Grandmother    Breast cancer Maternal Grandmother    Cancer Paternal Grandmother    Diabetes Paternal Grandmother    Melanoma Paternal Grandmother    Stomach cancer Paternal Grandmother     ALLERGIES:  is allergic to metformin and related, nsaids, perphenazine, sulfa antibiotics,  abilify [aripiprazole], and penicillins.  MEDICATIONS:  Current Outpatient Medications  Medication Sig Dispense Refill   ACCU-CHEK GUIDE test strip USE UP TO FOUR TIMES DAILY AS DIRECTED. 100 each 5  acetaminophen (TYLENOL) 325 MG tablet Take 650 mg by mouth every 6 (six) hours as needed for headache (pain).     albuterol (PROVENTIL) (2.5 MG/3ML) 0.083% nebulizer solution Take 3 mLs (2.5 mg total) by nebulization every 6 (six) hours as needed for shortness of breath or wheezing. 75 mL 0   albuterol (VENTOLIN HFA) 108 (90 Base) MCG/ACT inhaler Inhale 2 puffs into the lungs every 6 (six) hours as needed for wheezing or shortness of breath. 1 each 5   amantadine (SYMMETREL) 100 MG capsule Take 100 mg by mouth 2 (two) times daily.     blood glucose meter kit and supplies Dispense based on patient and insurance preference. Use up to four times daily as directed. (FOR ICD-10 E10.9, E11.9). 1 each 0   Budeson-Glycopyrrol-Formoterol (BREZTRI AEROSPHERE) 160-9-4.8 MCG/ACT AERO Inhale 2 puffs into the lungs in the morning and at bedtime. 5.9 g 11   buPROPion (WELLBUTRIN XL) 300 MG 24 hr tablet Take 300 mg by mouth daily.     Cholecalciferol (VITAMIN D-3) 125 MCG (5000 UT) TABS Take 5,000 Units by mouth daily. 30 tablet 1   clonazePAM (KLONOPIN) 0.5 MG tablet Take 0.5 mg by mouth daily.     dapagliflozin propanediol (FARXIGA) 10 MG TABS tablet Take 1 tablet (10 mg total) by mouth daily before breakfast. 90 tablet 1   diltiazem (CARDIZEM CD) 120 MG 24 hr capsule Take 1 capsule (120 mg total) by mouth daily. 90 capsule 1   docusate sodium (COLACE) 100 MG capsule TAKE 1 CAPSULE BY MOUTH TWICE DAILY 60 capsule 1   gabapentin (NEURONTIN) 300 MG capsule Take 300 mg by mouth 2 (two) times daily.     icosapent Ethyl (VASCEPA) 1 g capsule TAKE 2 CAPSULES BY MOUTH 2 TIMES DAILY 120 capsule 3   Insulin Pen Needle 32G X 4 MM MISC 1 each by Does not apply route daily at 12 noon. 100 each 0   ipratropium-albuterol  (DUONEB) 0.5-2.5 (3) MG/3ML SOLN Take 3 mLs by nebulization every 6 (six) hours as needed. 360 mL 11   levothyroxine (SYNTHROID) 75 MCG tablet TAKE 1 TABLET BY MOUTH DAILY BEFORE BREAKFAST 90 tablet 2   lidocaine-prilocaine (EMLA) cream Apply to affected area once 30 g 3   loperamide (IMODIUM) 2 MG capsule Take 1 capsule (2 mg total) by mouth See admin instructions. Initial: 4 mg,the 2 mg every 2 hours (4 mg every 4 hours at night)  maximum: 16 mg/day 60 capsule 2   loratadine (CLARITIN) 10 MG tablet Take 10 mg by mouth every morning.     losartan (COZAAR) 25 MG tablet TAKE 1 TABLET BY MOUTH DAILY 30 tablet 3   NON FORMULARY Pt uses a cpap nightly     nystatin cream (MYCOSTATIN) Apply 1 Application topically 2 (two) times daily. 30 g 0   ondansetron (ZOFRAN) 8 MG tablet Take 1 tablet (8 mg total) by mouth every 8 (eight) hours as needed for nausea, vomiting or refractory nausea / vomiting. Start on the third day after chemotherapy. 30 tablet 1   oxybutynin (DITROPAN-XL) 10 MG 24 hr tablet Take 1 tablet (10 mg total) by mouth daily. 30 tablet 11   paliperidone (INVEGA SUSTENNA) 234 MG/1.5ML SUSY injection Inject 234 mg into the muscle every 30 (thirty) days. On or about the 14th of each month     pantoprazole (PROTONIX) 40 MG tablet TAKE 1 TABLET BY MOUTH DAILY 90 tablet 2   Pediatric Multivit-Minerals-C (CHEWABLES MULTIVITAMIN PO) Take 1 tablet by  mouth daily.     pioglitazone (ACTOS) 15 MG tablet Take 1 tablet (15 mg total) by mouth daily. 90 tablet 1   promethazine (PHENERGAN) 12.5 MG tablet Take 1 tablet (12.5 mg total) by mouth every 6 (six) hours as needed for nausea or vomiting. 30 tablet 0   rosuvastatin (CRESTOR) 40 MG tablet TAKE 1 TABLET BY MOUTH EVERY MORNING 30 tablet 3   sertraline (ZOLOFT) 100 MG tablet Take 200 mg by mouth every morning.     Spacer/Aero-Holding Chambers (AEROCHAMBER MV) inhaler Use as instructed 1 each 0   hydrocortisone cream 0.5 % Apply 1 Application topically 2  (two) times daily as needed for itching. (Patient not taking: Reported on 07/02/2023) 30 g 1   Hydrocortisone, Perianal, 1 % CREA Apply topically. (Patient not taking: Reported on 07/02/2023)     No current facility-administered medications for this visit.   Facility-Administered Medications Ordered in Other Visits  Medication Dose Route Frequency Provider Last Rate Last Admin   albuterol (PROVENTIL) (2.5 MG/3ML) 0.083% nebulizer solution 2.5 mg  2.5 mg Nebulization Once Raechel Chute, MD         PHYSICAL EXAMINATION: ECOG PERFORMANCE STATUS: 0 - Asymptomatic Vitals:   07/02/23 0833  BP: 101/76  Pulse: 79  Resp: 18  Temp: (!) 96.7 F (35.9 C)    Filed Weights   07/02/23 0833  Weight: 139 lb 9.6 oz (63.3 kg)     Physical Exam Constitutional:      General: She is not in acute distress. HENT:     Head: Normocephalic and atraumatic.  Eyes:     General: No scleral icterus. Cardiovascular:     Rate and Rhythm: Normal rate and regular rhythm.  Pulmonary:     Effort: Pulmonary effort is normal. No respiratory distress.     Breath sounds: No wheezing.     Comments: Decreased breath sound bilaterally Abdominal:     General: Bowel sounds are normal. There is no distension.     Palpations: Abdomen is soft.     Comments: +colostomy   Musculoskeletal:        General: No deformity. Normal range of motion.     Cervical back: Normal range of motion and neck supple.  Skin:    General: Skin is warm and dry.     Findings: No erythema or rash.  Neurological:     Mental Status: She is alert and oriented to person, place, and time. Mental status is at baseline.     Cranial Nerves: No cranial nerve deficit.     Coordination: Coordination normal.  Psychiatric:        Mood and Affect: Mood normal.     LABORATORY DATA:  I have reviewed the data as listed    Latest Ref Rng & Units 07/02/2023    8:03 AM 06/08/2023    9:13 AM 06/05/2023    9:41 AM  CBC  WBC 4.0 - 10.5 K/uL 7.2  8.2   7.3   Hemoglobin 12.0 - 15.0 g/dL 84.1  32.4  40.1   Hematocrit 36.0 - 46.0 % 42.9  40.7  40.3   Platelets 150 - 400 K/uL 148  143  144       Latest Ref Rng & Units 07/02/2023    8:03 AM 06/08/2023    9:13 AM 06/05/2023    9:41 AM  CMP  Glucose 70 - 99 mg/dL 027  253  664   BUN 6 - 20 mg/dL 19  16  16  Creatinine 0.44 - 1.00 mg/dL 9.60  4.54  0.98   Sodium 135 - 145 mmol/L 137  140  135   Potassium 3.5 - 5.1 mmol/L 4.3  4.0  4.1   Chloride 98 - 111 mmol/L 102  104  104   CO2 22 - 32 mmol/L 28  26  25    Calcium 8.9 - 10.3 mg/dL 8.8  8.8  8.9   Total Protein 6.5 - 8.1 g/dL 7.1  7.0  7.0   Total Bilirubin 0.3 - 1.2 mg/dL 0.3  0.4  0.3   Alkaline Phos 38 - 126 U/L 77  95  95   AST 15 - 41 U/L 18  23  19    ALT 0 - 44 U/L 12  19  15          RADIOGRAPHIC STUDIES: I have personally reviewed the radiological images as listed and agreed with the findings in the report. IR Remove CV Fibrin Sheath  Result Date: 06/27/2023 INDICATION: Malfunctioning port with fibrin sheath diagnosed on previous port check EXAM: 1. Ultrasound-guided puncture of the right common femoral vein 2. Port catheter stripping using fluoroscopic guidance 3. Port injection and evaluation using fluoroscopic guidance MEDICATIONS: Documented in the EMR ANESTHESIA/SEDATION: Local analgesia FLUOROSCOPY TIME:  Fluoroscopy Time: 5.1 minutes (27 mGy) COMPLICATIONS: None immediate. PROCEDURE: Informed written consent was obtained from the patient after a thorough discussion of the procedural risks, benefits and alternatives. All questions were addressed. Maximal Sterile Barrier Technique was utilized including caps, mask, sterile gowns, sterile gloves, sterile drape, hand hygiene and skin antiseptic. A timeout was performed prior to the initiation of the procedure. The patient was placed supine on the exam table. The right groin was prepped and draped in the standard sterile fashion. Ultrasound was used to evaluate the right common  femoral vein, which found to be patent and suitable for access. An ultrasound image was permanently stored in the electronic medical record. Using ultrasound guidance, the right common femoral vein was directly punctured using a 21 gauge micropuncture set. An 018 wire was advanced centrally, followed by serial tract dilation and placement of a 5 French sheath. A 5 French angled catheter and Bentson guidewire were then advanced into superior vena cava. Over this wire, the access sheath was exchanged for a new 6 French 55 cm straight sheath. The sheath was advanced into the SVC. A 25 mm loop snare was then advanced through the sheath into the SVC. The distal aspect of the indwelling port catheter was then engaged with the loop snare, and port stripping was performed 3 separate times. The accessed port was then found to flush and aspirate appropriately. Injection of the port with contrast material demonstrated resolution of the previously seen fibrin sheath. At the end of the procedure, all catheters and wires were removed. Hemostasis was achieved at the access site using manual pressure. A clean dressing was placed. The port was de accessed after locking with heparinized saline. The patient tolerated the procedure well without immediate complication IMPRESSION: 1. Successful port catheter stripping via loop snare. 2. Reinjection of the port following catheter stripping demonstrated resolution of the fibrin sheath. It now flushes and aspirates appropriately. Port is ready for immediate use. Electronically Signed   By: Olive Bass M.D.   On: 06/27/2023 16:20   IR CV Line Injection  Result Date: 06/25/2023 INDICATION: Unable to aspirate port EXAM: Port injection and check using fluoroscopy MEDICATIONS: None ANESTHESIA/SEDATION: None FLUOROSCOPY TIME:  Fluoroscopy Time: 0.3 minutes (5 mGy) COMPLICATIONS:  None immediate. PROCEDURE: Informed written consent was obtained from the patient after a thorough discussion of  the procedural risks, benefits and alternatives. All questions were addressed. Maximal Sterile Barrier Technique was utilized including caps, mask, sterile gowns, sterile gloves, sterile drape, hand hygiene and skin antiseptic. A timeout was performed prior to the initiation of the procedure. Patient was placed supine on the exam table. The port was previously accessed by the infusion center. The port was then studied under fluoroscopy. There is a right-sided chest port which appears to enter via the right internal jugular vein. The port was unable to be aspirated. The port appeared to flush easily. The port was then injected with contrast material, and studied under fluoroscopy. There is no kink or discontinuity of the catheter. Further inspection of the catheter tip demonstrates eccentric injection of contrast material, compatible with a fibrin sheath. IMPRESSION: Malfunctioning port due to the presence of a fibrin sheath at the tip preventing aspiration. Findings discussed with the patient and referring provider, and patient will be scheduled for fibrin sheath stripping. Electronically Signed   By: Olive Bass M.D.   On: 06/25/2023 15:47   DG Chest 2 View  Result Date: 06/20/2023 CLINICAL DATA:  Cough. EXAM: CHEST - 2 VIEW COMPARISON:  CT chest dated May 31, 2023. Chest x-ray dated September 28, 2022. FINDINGS: Unchanged right chest wall port catheter. The heart size and mediastinal contours are within normal limits. Normal pulmonary vascularity. No focal consolidation, pleural effusion, or pneumothorax. No acute osseous abnormality. IMPRESSION: No active cardiopulmonary disease. Electronically Signed   By: Obie Dredge M.D.   On: 06/20/2023 10:29   US BIOPSY (LIVER)  Result Date: 06/08/2023 INDICATION: liver mass Briefly, 52 year old female with a history of rectal cancer with new liver masses. EXAM: ULTRASOUND GUIDED LIVER MASS BIOPSY COMPARISON:  CT CAP, 05/31/2023. MEDICATIONS: 10 mL lidocaine 1%  ANESTHESIA/SEDATION: Moderate (conscious) sedation was employed during this procedure. A total of Versed 1 mg and Fentanyl 50 mcg was administered intravenously. Moderate Sedation Time: 24 minutes. The patient's level of consciousness and vital signs were monitored continuously by radiology nursing throughout the procedure under my direct supervision. COMPLICATIONS: None immediate. PROCEDURE: Informed written consent was obtained from the patient and/or patient's representative after a discussion of the risks, benefits and alternatives to treatment. The patient understands and consents the procedure. A timeout was performed prior to the initiation of the procedure. Ultrasound scanning was performed of the right upper abdominal quadrant demonstrates small RIGHT hepatic lobe masses. The RIGHT hepatic lobe mass was selected for biopsy and the procedure was planned. The right upper abdominal quadrant was prepped and draped in the usual sterile fashion. The overlying soft tissues were anesthetized with 1% lidocaine with epinephrine. A 17 gauge, 6.8 cm co-axial needle was advanced into a peripheral aspect of the lesion. This was followed by 4 core biopsies with an 18 gauge core device under direct ultrasound guidance. The coaxial needle tract was embolized with a small amount of Gel-Foam slurry and superficial hemostasis was obtained with manual compression. Post procedural scanning was negative for definitive area of hemorrhage or additional complication. A dressing was placed. The patient tolerated the procedure well without immediate post procedural complication. IMPRESSION: Successful core needle biopsy of a RIGHT hepatic lobe mass. Roanna Banning, MD [facility] Radiology Electronically Signed   By: Roanna Banning M.D.   On: 06/08/2023 11:01

## 2023-07-04 ENCOUNTER — Inpatient Hospital Stay: Payer: 59

## 2023-07-04 ENCOUNTER — Ambulatory Visit: Payer: Self-pay

## 2023-07-04 VITALS — BP 106/72 | HR 72 | Temp 96.8°F | Resp 18

## 2023-07-04 DIAGNOSIS — R197 Diarrhea, unspecified: Secondary | ICD-10-CM | POA: Diagnosis not present

## 2023-07-04 DIAGNOSIS — I129 Hypertensive chronic kidney disease with stage 1 through stage 4 chronic kidney disease, or unspecified chronic kidney disease: Secondary | ICD-10-CM | POA: Diagnosis not present

## 2023-07-04 DIAGNOSIS — D122 Benign neoplasm of ascending colon: Secondary | ICD-10-CM | POA: Diagnosis not present

## 2023-07-04 DIAGNOSIS — N200 Calculus of kidney: Secondary | ICD-10-CM | POA: Diagnosis not present

## 2023-07-04 DIAGNOSIS — Z7984 Long term (current) use of oral hypoglycemic drugs: Secondary | ICD-10-CM | POA: Diagnosis not present

## 2023-07-04 DIAGNOSIS — E039 Hypothyroidism, unspecified: Secondary | ICD-10-CM | POA: Diagnosis not present

## 2023-07-04 DIAGNOSIS — G4730 Sleep apnea, unspecified: Secondary | ICD-10-CM | POA: Diagnosis not present

## 2023-07-04 DIAGNOSIS — E785 Hyperlipidemia, unspecified: Secondary | ICD-10-CM | POA: Diagnosis not present

## 2023-07-04 DIAGNOSIS — I700 Atherosclerosis of aorta: Secondary | ICD-10-CM | POA: Diagnosis not present

## 2023-07-04 DIAGNOSIS — C20 Malignant neoplasm of rectum: Secondary | ICD-10-CM | POA: Diagnosis not present

## 2023-07-04 DIAGNOSIS — E1122 Type 2 diabetes mellitus with diabetic chronic kidney disease: Secondary | ICD-10-CM | POA: Diagnosis not present

## 2023-07-04 DIAGNOSIS — E1140 Type 2 diabetes mellitus with diabetic neuropathy, unspecified: Secondary | ICD-10-CM | POA: Diagnosis not present

## 2023-07-04 DIAGNOSIS — Z5111 Encounter for antineoplastic chemotherapy: Secondary | ICD-10-CM | POA: Diagnosis not present

## 2023-07-04 DIAGNOSIS — F17210 Nicotine dependence, cigarettes, uncomplicated: Secondary | ICD-10-CM | POA: Diagnosis not present

## 2023-07-04 DIAGNOSIS — Z87442 Personal history of urinary calculi: Secondary | ICD-10-CM | POA: Diagnosis not present

## 2023-07-04 DIAGNOSIS — Z803 Family history of malignant neoplasm of breast: Secondary | ICD-10-CM | POA: Diagnosis not present

## 2023-07-04 DIAGNOSIS — Z79899 Other long term (current) drug therapy: Secondary | ICD-10-CM | POA: Diagnosis not present

## 2023-07-04 DIAGNOSIS — Z5189 Encounter for other specified aftercare: Secondary | ICD-10-CM | POA: Diagnosis not present

## 2023-07-04 DIAGNOSIS — Z800 Family history of malignant neoplasm of digestive organs: Secondary | ICD-10-CM | POA: Diagnosis not present

## 2023-07-04 DIAGNOSIS — K219 Gastro-esophageal reflux disease without esophagitis: Secondary | ICD-10-CM | POA: Diagnosis not present

## 2023-07-04 MED ORDER — HEPARIN SOD (PORK) LOCK FLUSH 100 UNIT/ML IV SOLN
500.0000 [IU] | Freq: Once | INTRAVENOUS | Status: AC | PRN
  Administered 2023-07-04: 500 [IU]
  Filled 2023-07-04: qty 5

## 2023-07-04 MED ORDER — SODIUM CHLORIDE 0.9% FLUSH
10.0000 mL | INTRAVENOUS | Status: DC | PRN
  Administered 2023-07-04: 10 mL
  Filled 2023-07-04: qty 10

## 2023-07-06 DIAGNOSIS — C20 Malignant neoplasm of rectum: Secondary | ICD-10-CM | POA: Diagnosis not present

## 2023-07-09 NOTE — Patient Instructions (Signed)
Visit Information  Thank you for taking time to visit with me today. Please don't hesitate to contact me if I can be of assistance to you.   Following are the goals we discussed today:   Goals Addressed             This Visit's Progress    Find housing, consolidate/reduce [facility] bills, food assistance       -Patient wants help with available housing -Patient wants financial resources to help with funds to move -Patient wants food assistance -Patient wants to reduce medical bills from [facility] Coordination Interventions: Patient is working with [employer] for housing Patient is working with [employer] for financial assistance Patient is working with [employer] to reduce medical bills        Our next appointment is by telephone on 07/25/23 at 11am  Please call the care guide team at [phone] if you need to cancel or reschedule your appointment.   If you are experiencing a Mental Health or Behavioral Health Crisis or need someone to talk to, please call 911  Patient verbalizes understanding of instructions and care plan provided today and agrees to view in MyChart. Active MyChart status and patient understanding of how to access instructions and care plan via MyChart confirmed with patient.     No further follow up required: 07/25/23 at 11am Lysle Morales, BSW Social Worker [facility] Care Management  [phone]

## 2023-07-09 NOTE — Patient Outreach (Signed)
  Care Coordination   Follow Up Visit Note   07/09/2023 Name: ZAREA DIESING MRN: [id number] DOB: 10-May-1971  [name] is a 52 y.o. year old female who sees Alba Cory, MD for primary care. I spoke with  [name] by phone today.  What matters to the patients health and wellness today?  Patients sons SSI benefits stopped which reduces the amount the family can afford in rent.  The family is now living with clients father.    Goals Addressed             This Visit's Progress    Find housing, consolidate/reduce [facility] bills, food assistance       -Patient wants help with available housing -Patient wants financial resources to help with funds to move -Patient wants food assistance -Patient wants to reduce medical bills from [facility] Coordination Interventions: Patient is working with [employer] for housing Patient is working with [employer] for financial assistance Patient is working with [employer] to reduce medical bills        SDOH assessments and interventions completed:  No     Care Coordination Interventions:  Yes, provided  Interventions Today    Flowsheet Row Most Recent Value  General Interventions   General Interventions Discussed/Reviewed General Interventions Reviewed  Charlyne Mom is working with the [employer] for housing.    Family is now living with patients father.  Patient also working with [employer] for financial assistance.  [facility] billing is working with patient on billing.]        Follow up plan: Appointment scheduled 07/25/23 at 11am  Encounter Outcome:  Pt. Visit Completed

## 2023-07-09 NOTE — Patient Outreach (Deleted)
{  CARE COORDINATION NOTES:27886} 

## 2023-07-09 NOTE — Patient Outreach (Deleted)
  Care Coordination   Follow Up Visit Note   07/09/2023 Name: April Luna MRN: [id number] DOB: [birth year]  April Luna is a 52 y.o. year old female who sees Alba Cory, MD for primary care. I spoke with  April Luna by phone today.  What matters to the patients health and wellness today?  Patients sons SSI benefits terminated so there is less funds for housing.  There is a landlord willing to work with the family. Patient is working with several agencies.    Goals Addressed   None     SDOH assessments and interventions completed:  {yes/no:20286}{THN Tip this will not be part of the note when signed-REQUIRED REPORT FIELD DO NOT DELETE (Optional):27901}     Care Coordination Interventions:  {INTERVENTIONS:27767} {THN Tip this will not be part of the note when signed-REQUIRED REPORT FIELD DO NOT DELETE (Optional):27901}  Follow up plan: {CCFOLLOWUP:27768}   Encounter Outcome:  {ENCOUTCOME:27770} {THN Tip this will not be part of the note when signed-REQUIRED REPORT FIELD DO NOT DELETE (Optional):27901}

## 2023-07-11 ENCOUNTER — Other Ambulatory Visit: Payer: 59

## 2023-07-11 ENCOUNTER — Ambulatory Visit: Payer: 59

## 2023-07-11 ENCOUNTER — Ambulatory Visit: Payer: 59 | Admitting: Oncology

## 2023-07-11 ENCOUNTER — Ambulatory Visit: Payer: 59 | Admitting: Urology

## 2023-07-12 DIAGNOSIS — C20 Malignant neoplasm of rectum: Secondary | ICD-10-CM | POA: Diagnosis not present

## 2023-07-13 MED FILL — Dexamethasone Sodium Phosphate Inj 100 MG/10ML: INTRAMUSCULAR | Qty: 1 | Status: AC

## 2023-07-13 NOTE — Telephone Encounter (Signed)
Report sent to media.

## 2023-07-16 ENCOUNTER — Inpatient Hospital Stay: Payer: 59

## 2023-07-16 ENCOUNTER — Inpatient Hospital Stay (HOSPITAL_BASED_OUTPATIENT_CLINIC_OR_DEPARTMENT_OTHER): Payer: 59 | Admitting: Oncology

## 2023-07-16 ENCOUNTER — Encounter: Payer: Self-pay | Admitting: Oncology

## 2023-07-16 ENCOUNTER — Other Ambulatory Visit: Payer: Self-pay | Admitting: Family Medicine

## 2023-07-16 VITALS — BP 120/80 | HR 85 | Temp 96.3°F | Resp 18

## 2023-07-16 VITALS — BP 114/78 | HR 73 | Temp 96.8°F | Resp 18 | Wt 141.0 lb

## 2023-07-16 DIAGNOSIS — Z5111 Encounter for antineoplastic chemotherapy: Secondary | ICD-10-CM | POA: Diagnosis not present

## 2023-07-16 DIAGNOSIS — K521 Toxic gastroenteritis and colitis: Secondary | ICD-10-CM | POA: Diagnosis not present

## 2023-07-16 DIAGNOSIS — D122 Benign neoplasm of ascending colon: Secondary | ICD-10-CM | POA: Diagnosis not present

## 2023-07-16 DIAGNOSIS — E1140 Type 2 diabetes mellitus with diabetic neuropathy, unspecified: Secondary | ICD-10-CM | POA: Diagnosis not present

## 2023-07-16 DIAGNOSIS — Z87442 Personal history of urinary calculi: Secondary | ICD-10-CM | POA: Diagnosis not present

## 2023-07-16 DIAGNOSIS — E785 Hyperlipidemia, unspecified: Secondary | ICD-10-CM | POA: Diagnosis not present

## 2023-07-16 DIAGNOSIS — Z800 Family history of malignant neoplasm of digestive organs: Secondary | ICD-10-CM | POA: Diagnosis not present

## 2023-07-16 DIAGNOSIS — C20 Malignant neoplasm of rectum: Secondary | ICD-10-CM

## 2023-07-16 DIAGNOSIS — Z7984 Long term (current) use of oral hypoglycemic drugs: Secondary | ICD-10-CM | POA: Diagnosis not present

## 2023-07-16 DIAGNOSIS — E1169 Type 2 diabetes mellitus with other specified complication: Secondary | ICD-10-CM

## 2023-07-16 DIAGNOSIS — N200 Calculus of kidney: Secondary | ICD-10-CM | POA: Diagnosis not present

## 2023-07-16 DIAGNOSIS — R197 Diarrhea, unspecified: Secondary | ICD-10-CM | POA: Diagnosis not present

## 2023-07-16 DIAGNOSIS — I700 Atherosclerosis of aorta: Secondary | ICD-10-CM | POA: Diagnosis not present

## 2023-07-16 DIAGNOSIS — T451X5A Adverse effect of antineoplastic and immunosuppressive drugs, initial encounter: Secondary | ICD-10-CM

## 2023-07-16 DIAGNOSIS — G620 Drug-induced polyneuropathy: Secondary | ICD-10-CM | POA: Diagnosis not present

## 2023-07-16 DIAGNOSIS — Z79899 Other long term (current) drug therapy: Secondary | ICD-10-CM | POA: Diagnosis not present

## 2023-07-16 DIAGNOSIS — Z95828 Presence of other vascular implants and grafts: Secondary | ICD-10-CM | POA: Diagnosis not present

## 2023-07-16 DIAGNOSIS — E039 Hypothyroidism, unspecified: Secondary | ICD-10-CM | POA: Diagnosis not present

## 2023-07-16 DIAGNOSIS — Z5189 Encounter for other specified aftercare: Secondary | ICD-10-CM | POA: Diagnosis not present

## 2023-07-16 DIAGNOSIS — K219 Gastro-esophageal reflux disease without esophagitis: Secondary | ICD-10-CM | POA: Diagnosis not present

## 2023-07-16 DIAGNOSIS — I129 Hypertensive chronic kidney disease with stage 1 through stage 4 chronic kidney disease, or unspecified chronic kidney disease: Secondary | ICD-10-CM | POA: Diagnosis not present

## 2023-07-16 DIAGNOSIS — Z803 Family history of malignant neoplasm of breast: Secondary | ICD-10-CM | POA: Diagnosis not present

## 2023-07-16 DIAGNOSIS — E1122 Type 2 diabetes mellitus with diabetic chronic kidney disease: Secondary | ICD-10-CM | POA: Diagnosis not present

## 2023-07-16 DIAGNOSIS — G4730 Sleep apnea, unspecified: Secondary | ICD-10-CM | POA: Diagnosis not present

## 2023-07-16 DIAGNOSIS — F17210 Nicotine dependence, cigarettes, uncomplicated: Secondary | ICD-10-CM | POA: Diagnosis not present

## 2023-07-16 LAB — CMP (CANCER CENTER ONLY)
ALT: 15 U/L (ref 0–44)
AST: 18 U/L (ref 15–41)
Albumin: 3.8 g/dL (ref 3.5–5.0)
Alkaline Phosphatase: 87 U/L (ref 38–126)
Anion gap: 10 (ref 5–15)
BUN: 11 mg/dL (ref 6–20)
CO2: 25 mmol/L (ref 22–32)
Calcium: 8.8 mg/dL — ABNORMAL LOW (ref 8.9–10.3)
Chloride: 103 mmol/L (ref 98–111)
Creatinine: 0.99 mg/dL (ref 0.44–1.00)
GFR, Estimated: >60 mL/min (ref >60)
Glucose, Bld: 126 mg/dL — ABNORMAL HIGH (ref 70–99)
Potassium: 4.2 mmol/L (ref 3.5–5.1)
Sodium: 138 mmol/L (ref 135–145)
Total Bilirubin: 0.3 mg/dL (ref 0.3–1.2)
Total Protein: 6.8 g/dL (ref 6.5–8.1)

## 2023-07-16 LAB — CBC WITH DIFFERENTIAL (CANCER CENTER ONLY)
Abs Immature Granulocytes: 0.02 10*3/uL (ref 0.00–0.07)
Basophils Absolute: 0 10*3/uL (ref 0.0–0.1)
Basophils Relative: 1 %
Eosinophils Absolute: 0.2 10*3/uL (ref 0.0–0.5)
Eosinophils Relative: 4 %
HCT: 41.2 % (ref 36.0–46.0)
Hemoglobin: 13.9 g/dL (ref 12.0–15.0)
Immature Granulocytes: 0 %
Lymphocytes Relative: 13 %
Lymphs Abs: 0.7 10*3/uL (ref 0.7–4.0)
MCH: 33.3 pg (ref 26.0–34.0)
MCHC: 33.7 g/dL (ref 30.0–36.0)
MCV: 98.6 fL (ref 80.0–100.0)
Monocytes Absolute: 0.4 10*3/uL (ref 0.1–1.0)
Monocytes Relative: 8 %
Neutro Abs: 3.9 10*3/uL (ref 1.7–7.7)
Neutrophils Relative %: 74 %
Platelet Count: 151 10*3/uL (ref 150–400)
RBC: 4.18 MIL/uL (ref 3.87–5.11)
RDW: 13.8 % (ref 11.5–15.5)
WBC Count: 5.2 10*3/uL (ref 4.0–10.5)
nRBC: 0 % (ref 0.0–0.2)

## 2023-07-16 LAB — PROTEIN, URINE, RANDOM: Total Protein, Urine: 9 mg/dL

## 2023-07-16 MED ORDER — PALONOSETRON HCL INJECTION 0.25 MG/5ML
0.2500 mg | Freq: Once | INTRAVENOUS | Status: AC
  Administered 2023-07-16: 0.25 mg via INTRAVENOUS
  Filled 2023-07-16: qty 5

## 2023-07-16 MED ORDER — FLUOROURACIL CHEMO INJECTION 2.5 GM/50ML
400.0000 mg/m2 | Freq: Once | INTRAVENOUS | Status: AC
  Administered 2023-07-16: 650 mg via INTRAVENOUS
  Filled 2023-07-16: qty 13

## 2023-07-16 MED ORDER — SODIUM CHLORIDE 0.9 % IV SOLN
10.0000 mg | Freq: Once | INTRAVENOUS | Status: AC
  Administered 2023-07-16: 10 mg via INTRAVENOUS
  Filled 2023-07-16: qty 10

## 2023-07-16 MED ORDER — SODIUM CHLORIDE 0.9 % IV SOLN
Freq: Once | INTRAVENOUS | Status: AC
  Filled 2023-07-16: qty 250

## 2023-07-16 MED ORDER — SODIUM CHLORIDE 0.9 % IV SOLN
180.0000 mg/m2 | Freq: Once | INTRAVENOUS | Status: AC
  Administered 2023-07-16: 300 mg via INTRAVENOUS
  Filled 2023-07-16: qty 15

## 2023-07-16 MED ORDER — SODIUM CHLORIDE 0.9 % IV SOLN
400.0000 mg/m2 | Freq: Once | INTRAVENOUS | Status: AC
  Administered 2023-07-16: 644 mg via INTRAVENOUS
  Filled 2023-07-16: qty 32.2

## 2023-07-16 MED ORDER — SODIUM CHLORIDE 0.9 % IV SOLN
5.0000 mg/kg | Freq: Once | INTRAVENOUS | Status: AC
  Administered 2023-07-16: 300 mg via INTRAVENOUS
  Filled 2023-07-16: qty 12

## 2023-07-16 MED ORDER — SODIUM CHLORIDE 0.9 % IV SOLN
2400.0000 mg/m2 | INTRAVENOUS | Status: DC
  Administered 2023-07-16: 3850 mg via INTRAVENOUS
  Filled 2023-07-16: qty 77

## 2023-07-16 MED ORDER — ATROPINE SULFATE 1 MG/ML IV SOLN
0.5000 mg | Freq: Once | INTRAVENOUS | Status: AC | PRN
  Administered 2023-07-16: 0.5 mg via INTRAVENOUS
  Filled 2023-07-16: qty 1

## 2023-07-16 NOTE — Assessment & Plan Note (Addendum)
History of Stage III. pT3 pN1b cM0, s/p APR. Currently on adjuvant chemotherapy. S/p FOLFOX x 4 cycles S/p concurrent chemotherapy [Xeloda 1300 mg BID] with RT, and then another 4 cycles of adjuvant FOLFOX [dose reduced oxaliplatin and omit 5-FU bolus CT imaging indicates disease progression.  Ultrasound-guided liver biopsy pathology showed metastatic carcinoma consistent with history of rectal adenocarcinoma.  CEA is elevated. Discussed with patient about stage IV current rectal adenocarcinoma I recommend checking [facility] NGS Recommend systemic chemotherapy FOLFIRI Plus bevacizumab. Labs are reviewed and discussed with patient. Proceed FOLFIRI + Bevacizumab today.

## 2023-07-16 NOTE — Assessment & Plan Note (Signed)
Chemotherapy plan as listed above 

## 2023-07-16 NOTE — Assessment & Plan Note (Signed)
Recommend Imodium PRN as directed. We discussed about instruction again today

## 2023-07-16 NOTE — Assessment & Plan Note (Addendum)
Grade 2  Continue gabapentin 300mg  BID.

## 2023-07-16 NOTE — Progress Notes (Signed)
Hematology/Oncology Progress note [phone] [phone]      CHIEF COMPLAINTS/REASON FOR VISIT:  Follow-up for rectal cancer treatments.  ASSESSMENT & PLAN:   Cancer Staging  Rectal cancer [facility]) Staging form: Colon and Rectum, AJCC 8th Edition - Pathologic stage from 05/03/2022: Stage IIIB (pT3, pN1b, cM0) - Signed by [name], MD on 05/03/2022 - Pathologic stage from 06/18/2023: Stage IVA (rpTX, pN0, pM1a) - Signed by [name], MD on 06/18/2023   Rectal cancer [facility]) History of Stage III. pT3 pN1b cM0, s/p APR. Currently on adjuvant chemotherapy. S/p FOLFOX x 4 cycles S/p concurrent chemotherapy [Xeloda 1300 mg BID] with RT, and then another 4 cycles of adjuvant FOLFOX [dose reduced oxaliplatin and omit 5-FU bolus CT imaging indicates disease progression.  Ultrasound-guided liver biopsy pathology showed metastatic carcinoma consistent with history of rectal adenocarcinoma.  CEA is elevated. Discussed with patient about stage IV current rectal adenocarcinoma I recommend checking [employer] NGS Recommend systemic chemotherapy FOLFIRI Plus bevacizumab. Labs are reviewed and discussed with patient. Proceed FOLFIRI + Bevacizumab today.   Encounter for antineoplastic chemotherapy Chemotherapy plan as listed above  Chemotherapy-induced neuropathy (HCC) Grade 2  Continue gabapentin 300mg  BID.   Chemotherapy induced diarrhea Recommend Imodium PRN as directed. We discussed about instruction again today   Orders Placed This Encounter  Procedures   Protein, urine, random    Standing Status:   Future    Standing Expiration Date:   07/29/2024   CBC with Differential (Cancer Center Only)    Standing Status:   Future    Standing Expiration Date:   07/29/2024   CMP (Cancer Center only)    Standing Status:   Future    Standing Expiration Date:   07/29/2024    Follow-up 2 weeks lab MD chemo  All questions were answered. The patient knows to call the clinic with any  problems, questions or concerns.  [name], MD, PhD [facility] Hematology Oncology 07/16/2023      HISTORY OF PRESENTING ILLNESS:   April Luna is a  52 y.o.  female presents for treatment of rectal cancer Oncology History  Rectal cancer (HCC)  02/26/2022 Imaging   MRI PELVIS WITHOUT CONTRAST- By imaging, rectal cancer stage:  T1/T2, N0, Mx    03/02/2022 Imaging   CT CHEST AND ABDOMEN WITH CONTRAST 1. No convincing evidence of metastatic disease within the chest or abdomen. 2. Atrophic left kidney with multifocal renal scarring and cortical calcifications as well as nonobstructive renal stones measuring up to 5 mm. 3. Prominent left-sided predominant retroperitoneal lymph nodes measuring up to 8 mm near the level of the renal hilum, overall decreased in size dating back to CT September 19, 2018 and favored reactive related to left renal inflammation. 4.  Aortic Atherosclerosis (ICD10-I70.0).   03/27/2022 Genetic Testing    [facility] CustomNext+RNA cancer panel found no pathogenic mutations.    04/21/2022 Initial Diagnosis   Rectal cancer - baseline CEA 3.6 -02/09/2022, patient had colonoscopy which showed renal mass 10 cm from anal verge.  5 mm polyp in ascending colon.  Removed and retrieved. Pathology showed rectal adenocarcinoma.  The polyp in the ascending colon is a tubular adenoma.  MRI showed cT1/T2N0 disease  -04/21/2022, patient underwent robotic assisted ultralow anterior resection. Pathology showed moderately differentiated adenocarcinoma, 4.5 cm in maximal extent, with focal extension through muscularis propria into perirectal soft tissue.  3 lymph nodes positive for metastatic carcinoma.  Negative margin.  pT3 pN1b, MSI stable.   05/03/2022 Cancer Staging  Staging form: Colon and Rectum, AJCC 8th Edition - Pathologic stage from 05/03/2022: Stage IIIB (pT3, pN1b, cM0) - Signed by [name], MD on 05/03/2022 Stage prefix: Initial diagnosis   05/11/2022 Miscellaneous    Medi port placed by Dr.White   05/26/2022 -  Chemotherapy   FOLFOX Q2 weeks x 4   05/26/2022 - 07/09/2022 Chemotherapy   Patient is on Treatment Plan : COLORECTAL FOLFOX q14d x 8 cycles     05/26/2022 - 12/13/2022 Chemotherapy   Patient is on Treatment Plan : COLORECTAL FOLFOX q14d x 4 months     07/27/2022 -  Chemotherapy   Concurrent chemotherapy- Xeloda  1300mg  BID and radiation.    07/27/2022 - 09/12/2022 Radiation Therapy    concurrent chemotherapy [Xeloda 1300 mg BID] with RT    09/20/2022 Imaging   CT Angiogram chest PET protocol There is no evidence of pulmonary artery embolism. There is no evidence of thoracic aortic dissection.   Small linear patchy alveolar infiltrate is seen in medial segment of right middle lobe suggesting atelectasis/pneumonia.     09/28/2022 - 09/30/2022 Hospital Admission   Admission due to acute respiratory failure with hypoxia, COPD exacerbation   09/28/2022 Imaging   CT angio chest PE protocol 1. Negative for acute PE or thoracic aortic dissection. 2. New ground-glass infiltrates anteriorly in bilateral upper lobes,may represent atypical edema, infectious or inflammatory process. 3.  Aortic Atherosclerosis     12/22/2022 Imaging   CT chest abdomen pelvis w contrast  Stable exam. No evidence of recurrent or metastatic carcinoma within the chest, abdomen, or pelvis.   Left nephrolithiasis and renal parenchymal atrophy. No evidence of ureteral calculi or hydronephrosis.   Aortic Atherosclerosis   05/31/2023 Imaging   CT chest abdomen pelvis with contrast showed 1. New hypodense 16 mm lesion in the right lobe of the liver with very subtle adjacent 5 mm lesion, suspicious for metastatic disease. 2. Small bilateral pulmonary nodules, some of which were subtly evident on prior examination only in retrospect, some of which demonstrate degree of cavitation, suspicious for metastatic disease. 3. Prominent retroperitoneal lymph nodes are similar prior. 4. Prior  low anterior resection with Hartmann's pouch formation,similar small amount of perirectal/presacral soft tissue and fluid,favored postsurgical/posttreatment change. Suggest continued attention on follow-up imaging. 5. Large volume of formed stool in the colon. 6. Nonobstructive left renal calculi measure under 1 cm.     06/08/2023 Relapse/Recurrence   Ultrasound-guided liver biopsy showed Pathology showed metastatic carcinoma, morphology consistent with patient's clinical history of rectal adenocarcinoma.  [employer] NGS 648 gene panel showed TP53 missense variant, APC frameshift, TCF7L2 frameshift, FLT3 copy number gain, TMB 4.72m/MB MSI stable.   [employer] RNA Seq - ERBB3 overexpressed, VEGFA overexpressed, NRAS overexpressed.    06/18/2023 Cancer Staging   Staging form: Colon and Rectum, AJCC 8th Edition - Pathologic stage from 06/18/2023: Stage IVA (rpTX, pN0, pM1a) - Signed by [name], MD on 06/18/2023 Stage prefix: Recurrence Total positive nodes: 0   06/25/2023 - 06/25/2023 Chemotherapy   Patient is on Treatment Plan : COLORECTAL FOLFOX + Bevacizumab q14d     07/02/2023 -  Chemotherapy   Patient is on Treatment Plan : COLORECTAL FOLFIRI + Bevacizumab q14d     Patient has bipolar and schizophrenia..  Patient is married  She has housing issue due to previous history of eviction.  she does not live with her husband currently. She lives with some family members.   INTERVAL HISTORY April Luna is a 52 y.o. female who has  above history reviewed by me today presents for follow up visit for rectal cancer.  She feels well today.  Denies any melena or blood in the stool. Stable neuropathy symptoms. Nausea, she took phenergan with relief.  Diarrhea started on the day of pump DC she only took one dose of Imodium. She had to change her colostomy twice for couple days. Improved now.     Review of Systems  Constitutional:  Positive for fatigue. Negative for appetite change, chills and fever.   HENT:   Negative for hearing loss and voice change.   Eyes:  Negative for eye problems.  Respiratory:  Negative for chest tightness, cough and shortness of breath.   Cardiovascular:  Negative for chest pain.  Gastrointestinal:  Positive for diarrhea and nausea. Negative for abdominal distention, abdominal pain and blood in stool.       + colostomy  Endocrine: Negative for hot flashes.  Genitourinary:  Negative for difficulty urinating and frequency.   Musculoskeletal:  Negative for arthralgias.  Skin:  Negative for itching and rash.  Neurological:  Positive for numbness. Negative for extremity weakness.  Hematological:  Negative for adenopathy.  Psychiatric/Behavioral:  Negative for confusion.     MEDICAL HISTORY:  Past Medical History:  Diagnosis Date   Allergy    pollen   Anxiety    Arthritis    right hip   Bipolar 1 disorder (HCC)    Cancer (HCC)    rectal   Chemotherapy-induced neuropathy (HCC) 10/24/2022   Chronic kidney disease    COPD (chronic obstructive pulmonary disease) (HCC)    Depression    Family history of adverse reaction to anesthesia    grand father had a stroke during anesthesia   Family history of breast cancer    Family history of colon cancer    Family history of uterine cancer    GERD (gastroesophageal reflux disease)    History of kidney stones    Hyperlipidemia    Hypertension    Hypothyroidism    Panic attack    Pneumonia    Psoriasis    Sleep apnea 08/11/2021   No CPAP   Type 2 diabetes mellitus with microalbuminuria, without long-term current use of insulin (HCC) 06/24/2019    SURGICAL HISTORY: Past Surgical History:  Procedure Laterality Date   BREAST BIOPSY Left 12/14/2021   [place] bx, venus marker, path pending   CESAREAN SECTION     COLONOSCOPY WITH PROPOFOL N/A 02/09/2022   Procedure: COLONOSCOPY WITH PROPOFOL;  Surgeon: Toney Reil, MD;  Location: [facility];  Service: Endoscopy;  Laterality: N/A;  sleep apnea    CYSTOSCOPY W/ RETROGRADES Left 11/08/2018   Procedure: CYSTOSCOPY WITH RETROGRADE PYELOGRAM;  Surgeon: Sondra Come, MD;  Location: [facility];  Service: Urology;  Laterality: Left;   CYSTOSCOPY/URETEROSCOPY/HOLMIUM LASER/STENT PLACEMENT Left 11/08/2018   Procedure: CYSTOSCOPY/URETEROSCOPY/HOLMIUM LASER/STENT PLACEMENT;  Surgeon: Sondra Come, MD;  Location: [facility];  Service: Urology;  Laterality: Left;   IR CV LINE INJECTION  06/25/2023   IR IMAGING GUIDED PORT INSERTION  05/11/2022   IR REMOVE CV FIBRIN SHEATH  06/27/2023   MOUTH SURGERY     wisdom teeth extraction   MOUTH SURGERY     teeth removal   POLYPECTOMY N/A 02/09/2022   Procedure: POLYPECTOMY;  Surgeon: Toney Reil, MD;  Location: [facility];  Service: Endoscopy;  Laterality: N/A;   XI ROBOTIC ASSISTED LOWER ANTERIOR RESECTION N/A 04/21/2022   Procedure: XI ROBOTIC ASSISTED LOWER ANTERIOR RESECTION WITH  COLOSTOMY, BILATERAL TAP BLOCK, ASSESSMENT OF TISSUE PERFUSSION WITH FIREFLY INJECTION;  Surgeon: Andria Meuse, MD;  Location: [facility];  Service: General;  Laterality: N/A;    SOCIAL HISTORY: Social History   Socioeconomic History   Marital status: Married    Spouse name: Lesle Chris   Number of children: 1   Years of education: 12   Highest education level: High school graduate  Occupational History   Occupation: unemployed    Comment: disabled  Tobacco Use   Smoking status: Some Days    Current packs/day: 0.25    Average packs/day: 0.3 packs/day for 38.2 years (9.5 ttl pk-yrs)    Types: Cigarettes    Start date: 05/13/1985    Passive exposure: Current   Smokeless tobacco: Never   Tobacco comments:    5-6 cigarettes weekly- 11/07/2022  Vaping Use   Vaping status: Former   Start date: 05/25/2018   Quit date: 09/24/2018  Substance and Sexual Activity   Alcohol use: Not Currently    Alcohol/week: 4.0 standard drinks of alcohol    Types: 4 Glasses of wine per week    Comment: quit ETOH  in Feb. 2023   Drug use: Yes    Types: Marijuana    Comment: 2 days ago   Sexual activity: Not Currently    Birth control/protection: Post-menopausal  Other Topics Concern   Not on file  Social History Narrative   Lives with husband and son    Social Determinants of Health   Financial Resource Strain: High Risk (08/25/2022)   Overall Financial Resource Strain (CARDIA)    Difficulty of Paying Living Expenses: Hard  Food Insecurity: Food Insecurity Present (06/06/2023)   Hunger Vital Sign    Worried About Running Out of Food in the Last Year: Sometimes true    Ran Out of Food in the Last Year: Sometimes true  Transportation Needs: No Transportation Needs (06/06/2023)   PRAPARE - Administrator, Civil Service (Medical): No    Lack of Transportation (Non-Medical): No  Recent Concern: Transportation Needs - Unmet Transportation Needs (03/15/2023)   PRAPARE - Transportation    Lack of Transportation (Medical): Yes    Lack of Transportation (Non-Medical): Yes  Physical Activity: Insufficiently Active (08/25/2022)   Exercise Vital Sign    Days of Exercise per Week: 1 day    Minutes of Exercise per Session: 10 min  Stress: Stress Concern Present (08/25/2022)   [employer] of Occupational Health - Occupational Stress Questionnaire    Feeling of Stress : To some extent  Social Connections: Moderately Integrated (08/25/2022)   Social Connection and Isolation Panel [NHANES]    Frequency of Communication with Friends and Family: More than three times a week    Frequency of Social Gatherings with Friends and Family: Twice a week    Attends Religious Services: Never    Database administrator or Organizations: No    Attends Engineer, structural: 1 to 4 times per year    Marital Status: Married  Catering manager Violence: Not At Risk (09/20/2022)   Humiliation, Afraid, Rape, and Kick questionnaire    Fear of Current or Ex-Partner: No    Emotionally Abused: No    Physically  Abused: No    Sexually Abused: No    FAMILY HISTORY: Family History  Problem Relation Age of Onset   Depression Mother    Anxiety disorder Mother    Diabetes Mother    Hypertension Mother  Hyperlipidemia Mother    Cancer Mother    Uterine cancer Mother 67   Cervical cancer Mother 35   Colon cancer Father    Depression Brother    Anxiety disorder Brother    Cancer Maternal Aunt        unk types   Diabetes Mellitus II Maternal Grandmother    Hypercholesterolemia Maternal Grandmother    Breast cancer Maternal Grandmother    Cancer Paternal Grandmother    Diabetes Paternal Grandmother    Melanoma Paternal Grandmother    Stomach cancer Paternal Grandmother     ALLERGIES:  is allergic to metformin and related, nsaids, perphenazine, sulfa antibiotics, abilify [aripiprazole], and penicillins.  MEDICATIONS:  Current Outpatient Medications  Medication Sig Dispense Refill   ACCU-CHEK GUIDE test strip USE UP TO FOUR TIMES DAILY AS DIRECTED. 100 each 5   acetaminophen (TYLENOL) 325 MG tablet Take 650 mg by mouth every 6 (six) hours as needed for headache (pain).     albuterol (PROVENTIL) (2.5 MG/3ML) 0.083% nebulizer solution Take 3 mLs (2.5 mg total) by nebulization every 6 (six) hours as needed for shortness of breath or wheezing. 75 mL 0   albuterol (VENTOLIN HFA) 108 (90 Base) MCG/ACT inhaler Inhale 2 puffs into the lungs every 6 (six) hours as needed for wheezing or shortness of breath. 1 each 5   amantadine (SYMMETREL) 100 MG capsule Take 100 mg by mouth 2 (two) times daily.     blood glucose meter kit and supplies Dispense based on patient and insurance preference. Use up to four times daily as directed. (FOR ICD-10 E10.9, E11.9). 1 each 0   Budeson-Glycopyrrol-Formoterol (BREZTRI AEROSPHERE) 160-9-4.8 MCG/ACT AERO Inhale 2 puffs into the lungs in the morning and at bedtime. 5.9 g 11   buPROPion (WELLBUTRIN XL) 300 MG 24 hr tablet Take 300 mg by mouth daily.     Cholecalciferol  (VITAMIN D-3) 125 MCG (5000 UT) TABS Take 5,000 Units by mouth daily. 30 tablet 1   clonazePAM (KLONOPIN) 0.5 MG tablet Take 0.5 mg by mouth daily.     dapagliflozin propanediol (FARXIGA) 10 MG TABS tablet Take 1 tablet (10 mg total) by mouth daily before breakfast. 90 tablet 1   diltiazem (CARDIZEM CD) 120 MG 24 hr capsule Take 1 capsule (120 mg total) by mouth daily. 90 capsule 1   docusate sodium (COLACE) 100 MG capsule TAKE 1 CAPSULE BY MOUTH TWICE DAILY 60 capsule 1   gabapentin (NEURONTIN) 300 MG capsule Take 300 mg by mouth 2 (two) times daily.     icosapent Ethyl (VASCEPA) 1 g capsule TAKE 2 CAPSULES BY MOUTH 2 TIMES DAILY 120 capsule 3   Insulin Pen Needle 32G X 4 MM MISC 1 each by Does not apply route daily at 12 noon. 100 each 0   ipratropium-albuterol (DUONEB) 0.5-2.5 (3) MG/3ML SOLN Take 3 mLs by nebulization every 6 (six) hours as needed. 360 mL 11   levothyroxine (SYNTHROID) 75 MCG tablet TAKE 1 TABLET BY MOUTH DAILY BEFORE BREAKFAST 90 tablet 2   lidocaine-prilocaine (EMLA) cream Apply to affected area once 30 g 3   loperamide (IMODIUM) 2 MG capsule Take 1 capsule (2 mg total) by mouth See admin instructions. Initial: 4 mg,the 2 mg every 2 hours (4 mg every 4 hours at night)  maximum: 16 mg/day 60 capsule 2   loratadine (CLARITIN) 10 MG tablet Take 10 mg by mouth every morning.     losartan (COZAAR) 25 MG tablet TAKE 1 TABLET BY MOUTH DAILY  30 tablet 3   nystatin cream (MYCOSTATIN) Apply 1 Application topically 2 (two) times daily. 30 g 0   ondansetron (ZOFRAN) 8 MG tablet Take 1 tablet (8 mg total) by mouth every 8 (eight) hours as needed for nausea, vomiting or refractory nausea / vomiting. Start on the third day after chemotherapy. 30 tablet 1   oxybutynin (DITROPAN-XL) 10 MG 24 hr tablet Take 1 tablet (10 mg total) by mouth daily. 30 tablet 11   paliperidone (INVEGA SUSTENNA) 234 MG/1.5ML SUSY injection Inject 234 mg into the muscle every 30 (thirty) days. On or about the 14th of  each month     pantoprazole (PROTONIX) 40 MG tablet TAKE 1 TABLET BY MOUTH DAILY 90 tablet 2   Pediatric Multivit-Minerals-C (CHEWABLES MULTIVITAMIN PO) Take 1 tablet by mouth daily.     pioglitazone (ACTOS) 15 MG tablet Take 1 tablet (15 mg total) by mouth daily. 90 tablet 1   promethazine (PHENERGAN) 12.5 MG tablet Take 1 tablet (12.5 mg total) by mouth every 6 (six) hours as needed for nausea or vomiting. 30 tablet 0   rosuvastatin (CRESTOR) 40 MG tablet TAKE 1 TABLET BY MOUTH EVERY MORNING 30 tablet 3   sertraline (ZOLOFT) 100 MG tablet Take 200 mg by mouth every morning.     Spacer/Aero-Holding Chambers (AEROCHAMBER MV) inhaler Use as instructed 1 each 0   hydrocortisone cream 0.5 % Apply 1 Application topically 2 (two) times daily as needed for itching. (Patient not taking: Reported on 07/16/2023) 30 g 1   Hydrocortisone, Perianal, 1 % CREA Apply topically. (Patient not taking: Reported on 07/02/2023)     NON FORMULARY Pt uses a cpap nightly (Patient not taking: Reported on 07/16/2023)     No current facility-administered medications for this visit.   Facility-Administered Medications Ordered in Other Visits  Medication Dose Route Frequency Provider Last Rate Last Admin   albuterol (PROVENTIL) (2.5 MG/3ML) 0.083% nebulizer solution 2.5 mg  2.5 mg Nebulization Once Raechel Chute, MD       fluorouracil (ADRUCIL) 3,850 mg in sodium chloride 0.9 % 73 mL chemo infusion  2,400 mg/m2 (Treatment Plan Recorded) Intravenous 1 day or 1 dose [name], MD   Infusion Verify at 07/16/23 1439     PHYSICAL EXAMINATION: ECOG PERFORMANCE STATUS: 0 - Asymptomatic Vitals:   07/16/23 0924  BP: 114/78  Pulse: 73  Resp: 18  Temp: (!) 96.8 F (36 C)  SpO2: 96%    Filed Weights   07/16/23 0924  Weight: 141 lb (64 kg)     Physical Exam Constitutional:      General: She is not in acute distress. HENT:     Head: Normocephalic and atraumatic.  Eyes:     General: No scleral icterus. Cardiovascular:      Rate and Rhythm: Normal rate and regular rhythm.  Pulmonary:     Effort: Pulmonary effort is normal. No respiratory distress.     Breath sounds: No wheezing.     Comments: Decreased breath sound bilaterally Abdominal:     General: Bowel sounds are normal. There is no distension.     Palpations: Abdomen is soft.     Comments: +colostomy   Musculoskeletal:        General: No deformity. Normal range of motion.     Cervical back: Normal range of motion and neck supple.  Skin:    General: Skin is warm and dry.     Findings: No erythema or rash.  Neurological:     Mental  Status: She is alert and oriented to person, place, and time. Mental status is at baseline.     Cranial Nerves: No cranial nerve deficit.     Coordination: Coordination normal.  Psychiatric:        Mood and Affect: Mood normal.     LABORATORY DATA:  I have reviewed the data as listed    Latest Ref Rng & Units 07/16/2023    8:56 AM 07/02/2023    8:03 AM 06/08/2023    9:13 AM  CBC  WBC 4.0 - 10.5 K/uL 5.2  7.2  8.2   Hemoglobin 12.0 - 15.0 g/dL 10.2  72.5  36.6   Hematocrit 36.0 - 46.0 % 41.2  42.9  40.7   Platelets 150 - 400 K/uL 151  148  143       Latest Ref Rng & Units 07/16/2023    8:56 AM 07/02/2023    8:03 AM 06/08/2023    9:13 AM  CMP  Glucose 70 - 99 mg/dL 440  347  425   BUN 6 - 20 mg/dL 11  19  16    Creatinine 0.44 - 1.00 mg/dL 9.56  3.87  5.64   Sodium 135 - 145 mmol/L 138  137  140   Potassium 3.5 - 5.1 mmol/L 4.2  4.3  4.0   Chloride 98 - 111 mmol/L 103  102  104   CO2 22 - 32 mmol/L 25  28  26    Calcium 8.9 - 10.3 mg/dL 8.8  8.8  8.8   Total Protein 6.5 - 8.1 g/dL 6.8  7.1  7.0   Total Bilirubin 0.3 - 1.2 mg/dL 0.3  0.3  0.4   Alkaline Phos 38 - 126 U/L 87  77  95   AST 15 - 41 U/L 18  18  23    ALT 0 - 44 U/L 15  12  19          RADIOGRAPHIC STUDIES: I have personally reviewed the radiological images as listed and agreed with the findings in the report. IR Remove CV Fibrin  Sheath  Result Date: 06/27/2023 INDICATION: Malfunctioning port with fibrin sheath diagnosed on previous port check EXAM: 1. Ultrasound-guided puncture of the right common femoral vein 2. Port catheter stripping using fluoroscopic guidance 3. Port injection and evaluation using fluoroscopic guidance MEDICATIONS: Documented in the EMR ANESTHESIA/SEDATION: Local analgesia FLUOROSCOPY TIME:  Fluoroscopy Time: 5.1 minutes (27 mGy) COMPLICATIONS: None immediate. PROCEDURE: Informed written consent was obtained from the patient after a thorough discussion of the procedural risks, benefits and alternatives. All questions were addressed. Maximal Sterile Barrier Technique was utilized including caps, mask, sterile gowns, sterile gloves, sterile drape, hand hygiene and skin antiseptic. A timeout was performed prior to the initiation of the procedure. The patient was placed supine on the exam table. The right groin was prepped and draped in the standard sterile fashion. Ultrasound was used to evaluate the right common femoral vein, which found to be patent and suitable for access. An ultrasound image was permanently stored in the electronic medical record. Using ultrasound guidance, the right common femoral vein was directly punctured using a 21 gauge micropuncture set. An 018 wire was advanced centrally, followed by serial tract dilation and placement of a 5 French sheath. A 5 French angled catheter and Bentson guidewire were then advanced into superior vena cava. Over this wire, the access sheath was exchanged for a new 6 French 55 cm straight sheath. The sheath was advanced into the SVC. A 25  mm loop snare was then advanced through the sheath into the SVC. The distal aspect of the indwelling port catheter was then engaged with the loop snare, and port stripping was performed 3 separate times. The accessed port was then found to flush and aspirate appropriately. Injection of the port with contrast material demonstrated  resolution of the previously seen fibrin sheath. At the end of the procedure, all catheters and wires were removed. Hemostasis was achieved at the access site using manual pressure. A clean dressing was placed. The port was de accessed after locking with heparinized saline. The patient tolerated the procedure well without immediate complication IMPRESSION: 1. Successful port catheter stripping via loop snare. 2. Reinjection of the port following catheter stripping demonstrated resolution of the fibrin sheath. It now flushes and aspirates appropriately. Port is ready for immediate use. Electronically Signed   By: Olive Bass M.D.   On: 06/27/2023 16:20   IR CV Line Injection  Result Date: 06/25/2023 INDICATION: Unable to aspirate port EXAM: Port injection and check using fluoroscopy MEDICATIONS: None ANESTHESIA/SEDATION: None FLUOROSCOPY TIME:  Fluoroscopy Time: 0.3 minutes (5 mGy) COMPLICATIONS: None immediate. PROCEDURE: Informed written consent was obtained from the patient after a thorough discussion of the procedural risks, benefits and alternatives. All questions were addressed. Maximal Sterile Barrier Technique was utilized including caps, mask, sterile gowns, sterile gloves, sterile drape, hand hygiene and skin antiseptic. A timeout was performed prior to the initiation of the procedure. Patient was placed supine on the exam table. The port was previously accessed by the infusion center. The port was then studied under fluoroscopy. There is a right-sided chest port which appears to enter via the right internal jugular vein. The port was unable to be aspirated. The port appeared to flush easily. The port was then injected with contrast material, and studied under fluoroscopy. There is no kink or discontinuity of the catheter. Further inspection of the catheter tip demonstrates eccentric injection of contrast material, compatible with a fibrin sheath. IMPRESSION: Malfunctioning port due to the presence of  a fibrin sheath at the tip preventing aspiration. Findings discussed with the patient and referring provider, and patient will be scheduled for fibrin sheath stripping. Electronically Signed   By: Olive Bass M.D.   On: 06/25/2023 15:47   DG Chest 2 View  Result Date: 06/20/2023 CLINICAL DATA:  Cough. EXAM: CHEST - 2 VIEW COMPARISON:  CT chest dated May 31, 2023. Chest x-ray dated September 28, 2022. FINDINGS: Unchanged right chest wall port catheter. The heart size and mediastinal contours are within normal limits. Normal pulmonary vascularity. No focal consolidation, pleural effusion, or pneumothorax. No acute osseous abnormality. IMPRESSION: No active cardiopulmonary disease. Electronically Signed   By: Obie Dredge M.D.   On: 06/20/2023 10:29

## 2023-07-17 DIAGNOSIS — C20 Malignant neoplasm of rectum: Secondary | ICD-10-CM | POA: Diagnosis not present

## 2023-07-17 NOTE — Progress Notes (Signed)
Enrolled April Luna into the [employer] and the [employer]

## 2023-07-18 ENCOUNTER — Inpatient Hospital Stay: Payer: 59

## 2023-07-18 VITALS — BP 105/70 | HR 70 | Resp 18

## 2023-07-18 DIAGNOSIS — N200 Calculus of kidney: Secondary | ICD-10-CM | POA: Diagnosis not present

## 2023-07-18 DIAGNOSIS — C20 Malignant neoplasm of rectum: Secondary | ICD-10-CM | POA: Diagnosis not present

## 2023-07-18 DIAGNOSIS — Z800 Family history of malignant neoplasm of digestive organs: Secondary | ICD-10-CM | POA: Diagnosis not present

## 2023-07-18 DIAGNOSIS — E1122 Type 2 diabetes mellitus with diabetic chronic kidney disease: Secondary | ICD-10-CM | POA: Diagnosis not present

## 2023-07-18 DIAGNOSIS — Z803 Family history of malignant neoplasm of breast: Secondary | ICD-10-CM | POA: Diagnosis not present

## 2023-07-18 DIAGNOSIS — F17210 Nicotine dependence, cigarettes, uncomplicated: Secondary | ICD-10-CM | POA: Diagnosis not present

## 2023-07-18 DIAGNOSIS — K219 Gastro-esophageal reflux disease without esophagitis: Secondary | ICD-10-CM | POA: Diagnosis not present

## 2023-07-18 DIAGNOSIS — Z5111 Encounter for antineoplastic chemotherapy: Secondary | ICD-10-CM | POA: Diagnosis not present

## 2023-07-18 DIAGNOSIS — I129 Hypertensive chronic kidney disease with stage 1 through stage 4 chronic kidney disease, or unspecified chronic kidney disease: Secondary | ICD-10-CM | POA: Diagnosis not present

## 2023-07-18 DIAGNOSIS — Z5189 Encounter for other specified aftercare: Secondary | ICD-10-CM | POA: Diagnosis not present

## 2023-07-18 DIAGNOSIS — Z79899 Other long term (current) drug therapy: Secondary | ICD-10-CM | POA: Diagnosis not present

## 2023-07-18 DIAGNOSIS — E785 Hyperlipidemia, unspecified: Secondary | ICD-10-CM | POA: Diagnosis not present

## 2023-07-18 DIAGNOSIS — E039 Hypothyroidism, unspecified: Secondary | ICD-10-CM | POA: Diagnosis not present

## 2023-07-18 DIAGNOSIS — Z87442 Personal history of urinary calculi: Secondary | ICD-10-CM | POA: Diagnosis not present

## 2023-07-18 DIAGNOSIS — R197 Diarrhea, unspecified: Secondary | ICD-10-CM | POA: Diagnosis not present

## 2023-07-18 DIAGNOSIS — I700 Atherosclerosis of aorta: Secondary | ICD-10-CM | POA: Diagnosis not present

## 2023-07-18 DIAGNOSIS — Z7984 Long term (current) use of oral hypoglycemic drugs: Secondary | ICD-10-CM | POA: Diagnosis not present

## 2023-07-18 DIAGNOSIS — D122 Benign neoplasm of ascending colon: Secondary | ICD-10-CM | POA: Diagnosis not present

## 2023-07-18 DIAGNOSIS — E1140 Type 2 diabetes mellitus with diabetic neuropathy, unspecified: Secondary | ICD-10-CM | POA: Diagnosis not present

## 2023-07-18 DIAGNOSIS — G4730 Sleep apnea, unspecified: Secondary | ICD-10-CM | POA: Diagnosis not present

## 2023-07-18 MED ORDER — SODIUM CHLORIDE 0.9% FLUSH
10.0000 mL | INTRAVENOUS | Status: DC | PRN
  Administered 2023-07-18: 10 mL
  Filled 2023-07-18: qty 10

## 2023-07-18 MED ORDER — HEPARIN SOD (PORK) LOCK FLUSH 100 UNIT/ML IV SOLN
500.0000 [IU] | Freq: Once | INTRAVENOUS | Status: AC | PRN
  Administered 2023-07-18: 500 [IU]
  Filled 2023-07-18: qty 5

## 2023-07-19 ENCOUNTER — Other Ambulatory Visit: Payer: Self-pay | Admitting: Oncology

## 2023-07-25 ENCOUNTER — Encounter: Payer: Self-pay | Admitting: Oncology

## 2023-07-25 ENCOUNTER — Telehealth: Payer: Self-pay | Admitting: *Deleted

## 2023-07-25 ENCOUNTER — Ambulatory Visit: Payer: Self-pay

## 2023-07-25 NOTE — Telephone Encounter (Signed)
Contacted Maggie. Updated letter has been faxed to [phone]

## 2023-07-25 NOTE — Telephone Encounter (Signed)
Maggie from the mental health division @ the [facility] is assisting pt with finding housing.  She stated that the housing authority informed them that the letter written on 07/16/23 from Dr Cathie Hoops needs to document in writing that pt has a terminal diagnosis. If this is possible then updated letter needs to be submitted. Maggie's number is [phone].

## 2023-07-25 NOTE — Telephone Encounter (Signed)
error 

## 2023-07-25 NOTE — Patient Outreach (Signed)
  Care Coordination   Follow Up Visit Note   07/25/2023 Name: April Luna MRN: [id number] DOB: 01/09/1971  April Luna is a 52 y.o. year old female who sees Alba Cory, MD for primary care. I spoke with  April Luna by phone today.  What matters to the patients health and wellness today?  Patient continues to work toward housing.  Started Chemo treatments 4 weeks ago and is doing well.      Goals Addressed             This Visit's Progress    Find housing, consolidate/reduce [facility] bills, food assistance       -Patient wants help with available housing -Patient wants financial resources to help with funds to move -Patient wants food assistance -Patient wants to reduce medical bills from [facility] Coordination Interventions: Patient is working with [employer] for housing Patient is working with [employer] for financial assistance Patient is working with [employer] to reduce medical bills  Care Coordination Interventions: Patient is working with her provider to obtain documentation to qualify for a discount on housing.   Patient is working with [employer] to resolve an eviction on her credit  report Patient has been [facility] Patient will communicate with [facility] regarding several bills Patient to apply for Medicaid today with support from [employer]           SDOH assessments and interventions completed:  No     Care Coordination Interventions:  Yes, provided  Interventions Today    Flowsheet Row Most Recent Value  General Interventions   General Interventions Discussed/Reviewed General Interventions Reviewed  [facility] assist with credit issues, [facility] Billing to address multiple bills, Medicaid application,]        Follow up plan: Follow up call scheduled for 08/08/23 at 11am.    Encounter Outcome:  Pt. Visit Completed

## 2023-07-30 ENCOUNTER — Inpatient Hospital Stay (HOSPITAL_BASED_OUTPATIENT_CLINIC_OR_DEPARTMENT_OTHER): Payer: 59 | Admitting: Oncology

## 2023-07-30 ENCOUNTER — Encounter: Payer: Self-pay | Admitting: Oncology

## 2023-07-30 ENCOUNTER — Inpatient Hospital Stay: Payer: 59

## 2023-07-30 ENCOUNTER — Inpatient Hospital Stay: Payer: 59 | Attending: Oncology

## 2023-07-30 VITALS — BP 103/67 | HR 84 | Temp 96.0°F | Resp 18 | Wt 141.0 lb

## 2023-07-30 DIAGNOSIS — K1230 Oral mucositis (ulcerative), unspecified: Secondary | ICD-10-CM | POA: Insufficient documentation

## 2023-07-30 DIAGNOSIS — Z5111 Encounter for antineoplastic chemotherapy: Secondary | ICD-10-CM

## 2023-07-30 DIAGNOSIS — E1140 Type 2 diabetes mellitus with diabetic neuropathy, unspecified: Secondary | ICD-10-CM | POA: Diagnosis not present

## 2023-07-30 DIAGNOSIS — Z7952 Long term (current) use of systemic steroids: Secondary | ICD-10-CM | POA: Diagnosis not present

## 2023-07-30 DIAGNOSIS — G620 Drug-induced polyneuropathy: Secondary | ICD-10-CM | POA: Diagnosis not present

## 2023-07-30 DIAGNOSIS — R197 Diarrhea, unspecified: Secondary | ICD-10-CM | POA: Insufficient documentation

## 2023-07-30 DIAGNOSIS — R21 Rash and other nonspecific skin eruption: Secondary | ICD-10-CM | POA: Diagnosis not present

## 2023-07-30 DIAGNOSIS — Z7984 Long term (current) use of oral hypoglycemic drugs: Secondary | ICD-10-CM | POA: Insufficient documentation

## 2023-07-30 DIAGNOSIS — Z79899 Other long term (current) drug therapy: Secondary | ICD-10-CM | POA: Diagnosis not present

## 2023-07-30 DIAGNOSIS — K0889 Other specified disorders of teeth and supporting structures: Secondary | ICD-10-CM | POA: Diagnosis not present

## 2023-07-30 DIAGNOSIS — I700 Atherosclerosis of aorta: Secondary | ICD-10-CM | POA: Diagnosis not present

## 2023-07-30 DIAGNOSIS — C20 Malignant neoplasm of rectum: Secondary | ICD-10-CM | POA: Insufficient documentation

## 2023-07-30 DIAGNOSIS — M549 Dorsalgia, unspecified: Secondary | ICD-10-CM | POA: Diagnosis not present

## 2023-07-30 DIAGNOSIS — E860 Dehydration: Secondary | ICD-10-CM

## 2023-07-30 DIAGNOSIS — E1122 Type 2 diabetes mellitus with diabetic chronic kidney disease: Secondary | ICD-10-CM | POA: Diagnosis not present

## 2023-07-30 DIAGNOSIS — K521 Toxic gastroenteritis and colitis: Secondary | ICD-10-CM

## 2023-07-30 DIAGNOSIS — Z79890 Hormone replacement therapy (postmenopausal): Secondary | ICD-10-CM | POA: Diagnosis not present

## 2023-07-30 DIAGNOSIS — T451X5A Adverse effect of antineoplastic and immunosuppressive drugs, initial encounter: Secondary | ICD-10-CM

## 2023-07-30 DIAGNOSIS — D122 Benign neoplasm of ascending colon: Secondary | ICD-10-CM | POA: Diagnosis not present

## 2023-07-30 LAB — CBC WITH DIFFERENTIAL (CANCER CENTER ONLY)
Abs Immature Granulocytes: 0.01 10*3/uL (ref 0.00–0.07)
Basophils Absolute: 0 10*3/uL (ref 0.0–0.1)
Basophils Relative: 1 %
Eosinophils Absolute: 0.1 10*3/uL (ref 0.0–0.5)
Eosinophils Relative: 3 %
HCT: 41 % (ref 36.0–46.0)
Hemoglobin: 14 g/dL (ref 12.0–15.0)
Immature Granulocytes: 0 %
Lymphocytes Relative: 17 %
Lymphs Abs: 0.7 10*3/uL (ref 0.7–4.0)
MCH: 33 pg (ref 26.0–34.0)
MCHC: 34.1 g/dL (ref 30.0–36.0)
MCV: 96.7 fL (ref 80.0–100.0)
Monocytes Absolute: 0.4 10*3/uL (ref 0.1–1.0)
Monocytes Relative: 10 %
Neutro Abs: 3 10*3/uL (ref 1.7–7.7)
Neutrophils Relative %: 69 %
Platelet Count: 116 10*3/uL — ABNORMAL LOW (ref 150–400)
RBC: 4.24 MIL/uL (ref 3.87–5.11)
RDW: 14.3 % (ref 11.5–15.5)
WBC Count: 4.3 10*3/uL (ref 4.0–10.5)
nRBC: 0 % (ref 0.0–0.2)

## 2023-07-30 LAB — CMP (CANCER CENTER ONLY)
ALT: 13 U/L (ref 0–44)
AST: 17 U/L (ref 15–41)
Albumin: 3.8 g/dL (ref 3.5–5.0)
Alkaline Phosphatase: 83 U/L (ref 38–126)
Anion gap: 8 (ref 5–15)
BUN: 19 mg/dL (ref 6–20)
CO2: 26 mmol/L (ref 22–32)
Calcium: 8.9 mg/dL (ref 8.9–10.3)
Chloride: 102 mmol/L (ref 98–111)
Creatinine: 1.05 mg/dL — ABNORMAL HIGH (ref 0.44–1.00)
GFR, Estimated: >60 mL/min (ref >60)
Glucose, Bld: 111 mg/dL — ABNORMAL HIGH (ref 70–99)
Potassium: 4.1 mmol/L (ref 3.5–5.1)
Sodium: 136 mmol/L (ref 135–145)
Total Bilirubin: 0.3 mg/dL (ref 0.3–1.2)
Total Protein: 7 g/dL (ref 6.5–8.1)

## 2023-07-30 LAB — PROTEIN, URINE, RANDOM: Total Protein, Urine: <6 mg/dL

## 2023-07-30 MED ORDER — FLUOROURACIL CHEMO INJECTION 2.5 GM/50ML
400.0000 mg/m2 | Freq: Once | INTRAVENOUS | Status: AC
  Administered 2023-07-30: 650 mg via INTRAVENOUS
  Filled 2023-07-30: qty 13

## 2023-07-30 MED ORDER — PALONOSETRON HCL INJECTION 0.25 MG/5ML
0.2500 mg | Freq: Once | INTRAVENOUS | Status: AC
  Administered 2023-07-30: 0.25 mg via INTRAVENOUS
  Filled 2023-07-30: qty 5

## 2023-07-30 MED ORDER — ATROPINE SULFATE 1 MG/ML IV SOLN
0.5000 mg | Freq: Once | INTRAVENOUS | Status: AC | PRN
  Administered 2023-07-30: 0.5 mg via INTRAVENOUS
  Filled 2023-07-30: qty 1

## 2023-07-30 MED ORDER — SODIUM CHLORIDE 0.9 % IV SOLN
5.0000 mg/kg | Freq: Once | INTRAVENOUS | Status: AC
  Administered 2023-07-30: 300 mg via INTRAVENOUS
  Filled 2023-07-30: qty 12

## 2023-07-30 MED ORDER — SODIUM CHLORIDE 0.9 % IV SOLN
Freq: Once | INTRAVENOUS | Status: AC
  Filled 2023-07-30: qty 250

## 2023-07-30 MED ORDER — HEPARIN SOD (PORK) LOCK FLUSH 100 UNIT/ML IV SOLN
500.0000 [IU] | Freq: Once | INTRAVENOUS | Status: DC | PRN
  Filled 2023-07-30: qty 5

## 2023-07-30 MED ORDER — DEXAMETHASONE 4 MG PO TABS: 8.0000 mg | ORAL_TABLET | ORAL | 1 refills | Status: DC

## 2023-07-30 MED ORDER — SODIUM CHLORIDE 0.9 % IV SOLN
10.0000 mg | Freq: Once | INTRAVENOUS | Status: AC
  Administered 2023-07-30: 10 mg via INTRAVENOUS
  Filled 2023-07-30: qty 10

## 2023-07-30 MED ORDER — HYDROCORTISONE 0.5 % EX CREA: 1.0000 | TOPICAL_CREAM | Freq: Three times a day (TID) | CUTANEOUS | 1 refills | Status: DC

## 2023-07-30 MED ORDER — SODIUM CHLORIDE 0.9 % IV SOLN
180.0000 mg/m2 | Freq: Once | INTRAVENOUS | Status: AC
  Administered 2023-07-30: 300 mg via INTRAVENOUS
  Filled 2023-07-30: qty 15

## 2023-07-30 MED ORDER — SODIUM CHLORIDE 0.9 % IV SOLN
400.0000 mg/m2 | Freq: Once | INTRAVENOUS | Status: AC
  Administered 2023-07-30: 644 mg via INTRAVENOUS
  Filled 2023-07-30: qty 32.2

## 2023-07-30 MED ORDER — SODIUM CHLORIDE 0.9 % IV SOLN
2400.0000 mg/m2 | INTRAVENOUS | Status: DC
  Administered 2023-07-30: 3850 mg via INTRAVENOUS
  Filled 2023-07-30: qty 77

## 2023-07-30 NOTE — Patient Instructions (Signed)
[facility]  Discharge Instructions: Thank you for choosing [facility] to provide your oncology and hematology care.  If you have a lab appointment with the Cancer Center, please go directly to the Cancer Center and check in at the registration area.  Wear comfortable clothing and clothing appropriate for easy access to any Portacath or PICC line.   We strive to give you quality time with your provider. You may need to reschedule your appointment if you arrive late (15 or more minutes).  Arriving late affects you and other patients whose appointments are after yours.  Also, if you miss three or more appointments without notifying the office, you may be dismissed from the clinic at the provider's discretion.      For prescription refill requests, have your pharmacy contact our office and allow 72 hours for refills to be completed.    Today you received the following chemotherapy and/or immunotherapy agents Mvasi, Camptosar, Leucovorin & Adrucil      To help prevent nausea and vomiting after your treatment, we encourage you to take your nausea medication as directed.  BELOW ARE SYMPTOMS THAT SHOULD BE REPORTED IMMEDIATELY: *FEVER GREATER THAN 100.4 F (38 C) OR HIGHER *CHILLS OR SWEATING *NAUSEA AND VOMITING THAT IS NOT CONTROLLED WITH YOUR NAUSEA MEDICATION *UNUSUAL SHORTNESS OF BREATH *UNUSUAL BRUISING OR BLEEDING *URINARY PROBLEMS (pain or burning when urinating, or frequent urination) *BOWEL PROBLEMS (unusual diarrhea, constipation, pain near the anus) TENDERNESS IN MOUTH AND THROAT WITH OR WITHOUT PRESENCE OF ULCERS (sore throat, sores in mouth, or a toothache) UNUSUAL RASH, SWELLING OR PAIN  UNUSUAL VAGINAL DISCHARGE OR ITCHING   Items with * indicate a potential emergency and should be followed up as soon as possible or go to the Emergency Department if any problems should occur.  Please show the CHEMOTHERAPY ALERT CARD or  IMMUNOTHERAPY ALERT CARD at check-in to the Emergency Department and triage nurse.  Should you have questions after your visit or need to cancel or reschedule your appointment, please contact [facility]  [phone] and follow the prompts.  Office hours are 8:00 a.m. to 4:30 p.m. Monday - Friday. Please note that voicemails left after 4:00 p.m. may not be returned until the following business day.  We are closed weekends and major holidays. You have access to a nurse at all times for urgent questions. Please call the main number to the clinic [phone] and follow the prompts.  For any non-urgent questions, you may also contact your provider using MyChart. We now offer e-Visits for anyone 92 and older to request care online for non-urgent symptoms. For details visit [email or web address].   Also download the MyChart app! Go to the app store, search "MyChart", open the app, select [facility], and log in with your MyChart username and password.

## 2023-07-30 NOTE — Assessment & Plan Note (Signed)
History of Stage III. pT3 pN1b cM0, s/p APR. 05/2023 Stage IV current rectal adenocarcinoma Currently on adjuvant chemotherapy. S/p FOLFOX x 4 cycles S/p concurrent chemotherapy [Xeloda 1300 mg BID] with RT, and then another 4 cycles of adjuvant FOLFOX [dose reduced oxaliplatin and omit 5-FU bolus 05/2023 CT imaging indicates disease progression. liver biopsy pathology showed metastatic carcinoma--> FOLFIRI + Bevacizumab   [facility] are reviewed and discussed with patient. Proceed FOLFIRI + Bevacizumab today.

## 2023-07-30 NOTE — Progress Notes (Signed)
Hematology/Oncology Progress note [phone] [phone]      CHIEF COMPLAINTS/REASON FOR VISIT:  Follow-up for rectal cancer treatments.  ASSESSMENT & PLAN:   Cancer Staging  Rectal cancer [facility]) Staging form: Colon and Rectum, AJCC 8th Edition - Pathologic stage from 05/03/2022: Stage IIIB (pT3, pN1b, cM0) - Signed by [name], MD on 05/03/2022 - Pathologic stage from 06/18/2023: Stage IVA (rpTX, pN0, pM1a) - Signed by [name], MD on 06/18/2023   Rectal cancer [facility]) History of Stage III. pT3 pN1b cM0, s/p APR. 05/2023 Stage IV current rectal adenocarcinoma Currently on adjuvant chemotherapy. S/p FOLFOX x 4 cycles S/p concurrent chemotherapy [Xeloda 1300 mg BID] with RT, and then another 4 cycles of adjuvant FOLFOX [dose reduced oxaliplatin and omit 5-FU bolus 05/2023 CT imaging indicates disease progression. liver biopsy pathology showed metastatic carcinoma--> FOLFIRI + Bevacizumab   [facility] N[facility] Labs are reviewed and discussed with patient. Proceed FOLFIRI + Bevacizumab today.   Encounter for antineoplastic chemotherapy Chemotherapy plan as listed above  Chemotherapy-induced neuropathy (HCC) Grade 2  Continue gabapentin 300mg  BID.   Chemotherapy induced diarrhea Recommend Imodium PRN as directed.   Skin rash Recommend Dexamethasone 8mg  daily for 2 days.  Apply topical hydrocortisone cream TID PRN   Orders Placed This Encounter  Procedures   MR Lumbar Spine W Wo Contrast    Standing Status:   Future    Standing Expiration Date:   07/29/2024    Order Specific Question:   If indicated for the ordered procedure, I authorize the administration of contrast media per Radiology protocol    Answer:   Yes    Order Specific Question:   What is the patient's sedation requirement?    Answer:   No Sedation    Order Specific Question:   Does the patient have a pacemaker or implanted devices?    Answer:   No    Order Specific Question:   Use SRS Protocol?     Answer:   No    Order Specific Question:   Preferred imaging location?    Answer:   [facility] (table limit - 550lbs)   Protein, urine, random    Standing Status:   Future    Standing Expiration Date:   08/12/2024   CBC with Differential (Cancer Center Only)    Standing Status:   Future    Standing Expiration Date:   08/12/2024   CMP (Cancer Center only)    Standing Status:   Future    Standing Expiration Date:   08/12/2024   Protein, urine, random    Standing Status:   Future    Standing Expiration Date:   08/27/2024   CBC with Differential (Cancer Center Only)    Standing Status:   Future    Standing Expiration Date:   08/27/2024   CMP (Cancer Center only)    Standing Status:   Future    Standing Expiration Date:   08/27/2024   Protein, urine, random    Standing Status:   Future    Standing Expiration Date:   09/10/2024   CBC with Differential (Cancer Center Only)    Standing Status:   Future    Standing Expiration Date:   09/10/2024   CMP (Cancer Center only)    Standing Status:   Future    Standing Expiration Date:   09/10/2024    Follow-up 2 weeks lab MD chemo  All questions were answered. The patient knows to call the clinic with any problems, questions or  concerns.  [name], MD, PhD [facility] Hematology Oncology 07/30/2023      HISTORY OF PRESENTING ILLNESS:   April Luna is a  52 y.o.  female presents for treatment of rectal cancer Oncology History  Rectal cancer (HCC)  02/26/2022 Imaging   MRI PELVIS WITHOUT CONTRAST- By imaging, rectal cancer stage:  T1/T2, N0, Mx    03/02/2022 Imaging   CT CHEST AND ABDOMEN WITH CONTRAST 1. No convincing evidence of metastatic disease within the chest or abdomen. 2. Atrophic left kidney with multifocal renal scarring and cortical calcifications as well as nonobstructive renal stones measuring up to 5 mm. 3. Prominent left-sided predominant retroperitoneal lymph nodes measuring up to 8 mm near the level of the renal  hilum, overall decreased in size dating back to CT September 19, 2018 and favored reactive related to left renal inflammation. 4.  Aortic Atherosclerosis (ICD10-I70.0).   03/27/2022 Genetic Testing    [facility] [employer]+RNA cancer panel found no pathogenic mutations.    04/21/2022 Initial Diagnosis   Rectal cancer - baseline CEA 3.6 -02/09/2022, patient had colonoscopy which showed renal mass 10 cm from anal verge.  5 mm polyp in ascending colon.  Removed and retrieved. Pathology showed rectal adenocarcinoma.  The polyp in the ascending colon is a tubular adenoma.  MRI showed cT1/T2N0 disease  -04/21/2022, patient underwent robotic assisted ultralow anterior resection. Pathology showed moderately differentiated adenocarcinoma, 4.5 cm in maximal extent, with focal extension through muscularis propria into perirectal soft tissue.  3 lymph nodes positive for metastatic carcinoma.  Negative margin.  pT3 pN1b, MSI stable.   05/03/2022 Cancer Staging   Staging form: Colon and Rectum, AJCC 8th Edition - Pathologic stage from 05/03/2022: Stage IIIB (pT3, pN1b, cM0) - Signed by [name], MD on 05/03/2022 Stage prefix: Initial diagnosis   05/11/2022 Miscellaneous   Medi port placed by Dr.White   05/26/2022 -  Chemotherapy   FOLFOX Q2 weeks x 4   05/26/2022 - 07/09/2022 Chemotherapy   Patient is on Treatment Plan : COLORECTAL FOLFOX q14d x 8 cycles     05/26/2022 - 12/13/2022 Chemotherapy   Patient is on Treatment Plan : COLORECTAL FOLFOX q14d x 4 months     07/27/2022 -  Chemotherapy   Concurrent chemotherapy- Xeloda  1300mg  BID and radiation.    07/27/2022 - 09/12/2022 Radiation Therapy    concurrent chemotherapy [Xeloda 1300 mg BID] with RT    09/20/2022 Imaging   CT Angiogram chest PET protocol There is no evidence of pulmonary artery embolism. There is no evidence of thoracic aortic dissection.   Small linear patchy alveolar infiltrate is seen in medial segment of right middle lobe suggesting  atelectasis/pneumonia.     09/28/2022 - 09/30/2022 Hospital Admission   Admission due to acute respiratory failure with hypoxia, COPD exacerbation   09/28/2022 Imaging   CT angio chest PE protocol 1. Negative for acute PE or thoracic aortic dissection. 2. New ground-glass infiltrates anteriorly in bilateral upper lobes,may represent atypical edema, infectious or inflammatory process. 3.  Aortic Atherosclerosis     12/22/2022 Imaging   CT chest abdomen pelvis w contrast  Stable exam. No evidence of recurrent or metastatic carcinoma within the chest, abdomen, or pelvis.   Left nephrolithiasis and renal parenchymal atrophy. No evidence of ureteral calculi or hydronephrosis.   Aortic Atherosclerosis   05/31/2023 Imaging   CT chest abdomen pelvis with contrast showed 1. New hypodense 16 mm lesion in the right lobe of the liver with very subtle adjacent 5  mm lesion, suspicious for metastatic disease. 2. Small bilateral pulmonary nodules, some of which were subtly evident on prior examination only in retrospect, some of which demonstrate degree of cavitation, suspicious for metastatic disease. 3. Prominent retroperitoneal lymph nodes are similar prior. 4. Prior low anterior resection with Hartmann's pouch formation,similar small amount of perirectal/presacral soft tissue and fluid,favored postsurgical/posttreatment change. Suggest continued attention on follow-up imaging. 5. Large volume of formed stool in the colon. 6. Nonobstructive left renal calculi measure under 1 cm.     06/08/2023 Relapse/Recurrence   Ultrasound-guided liver biopsy showed Pathology showed metastatic carcinoma, morphology consistent with patient's clinical history of rectal adenocarcinoma.  [facility] NGS 648 gene panel showed TP53 missense variant, APC frameshift, TCF7L2 frameshift, FLT3 copy number gain, TMB 4.41m/MB MSI stable.   [facility] RNA Seq - ERBB3 overexpressed, VEGFA overexpressed, NRAS overexpressed.     06/18/2023 Cancer Staging   Staging form: Colon and Rectum, AJCC 8th Edition - Pathologic stage from 06/18/2023: Stage IVA (rpTX, pN0, pM1a) - Signed by [name], MD on 06/18/2023 Stage prefix: Recurrence Total positive nodes: 0   06/25/2023 - 06/25/2023 Chemotherapy   Patient is on Treatment Plan : COLORECTAL FOLFOX + Bevacizumab q14d     07/02/2023 -  Chemotherapy   Patient is on Treatment Plan : COLORECTAL FOLFIRI + Bevacizumab q14d     Patient has bipolar and schizophrenia..  Patient is married  She has housing issue due to previous history of eviction.  she does not live with her husband currently. She lives with some family members.   INTERVAL HISTORY AGNITA STROUD is a 52 y.o. female who has above history reviewed by me today presents for follow up visit for rectal cancer.  She feels well today.  Denies any melena or blood in the stool. Stable neuropathy symptoms. Nausea, she took phenergan with relief.  Diarrhea managed by anti diarrhea.  Skin rash around her neck    Review of Systems  Constitutional:  Positive for fatigue. Negative for appetite change, chills and fever.  HENT:   Negative for hearing loss and voice change.   Eyes:  Negative for eye problems.  Respiratory:  Negative for chest tightness, cough and shortness of breath.   Cardiovascular:  Negative for chest pain.  Gastrointestinal:  Positive for diarrhea and nausea. Negative for abdominal distention, abdominal pain and blood in stool.       + colostomy  Endocrine: Negative for hot flashes.  Genitourinary:  Negative for difficulty urinating and frequency.   Musculoskeletal:  Negative for arthralgias.  Skin:  Positive for rash. Negative for itching.  Neurological:  Positive for numbness. Negative for extremity weakness.  Hematological:  Negative for adenopathy.  Psychiatric/Behavioral:  Negative for confusion.     MEDICAL HISTORY:  Past Medical History:  Diagnosis Date   Allergy    pollen   Anxiety     Arthritis    right hip   Bipolar 1 disorder (HCC)    Cancer (HCC)    rectal   Chemotherapy-induced neuropathy (HCC) 10/24/2022   Chronic kidney disease    COPD (chronic obstructive pulmonary disease) (HCC)    Depression    Family history of adverse reaction to anesthesia    grand father had a stroke during anesthesia   Family history of breast cancer    Family history of colon cancer    Family history of uterine cancer    GERD (gastroesophageal reflux disease)    History of kidney stones    Hyperlipidemia  Hypertension    Hypothyroidism    Panic attack    Pneumonia    Psoriasis    Sleep apnea 08/11/2021   No CPAP   Type 2 diabetes mellitus with microalbuminuria, without long-term current use of insulin (HCC) 06/24/2019    SURGICAL HISTORY: Past Surgical History:  Procedure Laterality Date   BREAST BIOPSY Left 12/14/2021   [place] bx, venus marker, path pending   CESAREAN SECTION     COLONOSCOPY WITH PROPOFOL N/A 02/09/2022   Procedure: COLONOSCOPY WITH PROPOFOL;  Surgeon: Toney Reil, MD;  Location: [facility];  Service: Endoscopy;  Laterality: N/A;  sleep apnea   CYSTOSCOPY W/ RETROGRADES Left 11/08/2018   Procedure: CYSTOSCOPY WITH RETROGRADE PYELOGRAM;  Surgeon: Sondra Come, MD;  Location: [facility];  Service: Urology;  Laterality: Left;   CYSTOSCOPY/URETEROSCOPY/HOLMIUM LASER/STENT PLACEMENT Left 11/08/2018   Procedure: CYSTOSCOPY/URETEROSCOPY/HOLMIUM LASER/STENT PLACEMENT;  Surgeon: Sondra Come, MD;  Location: [facility];  Service: Urology;  Laterality: Left;   IR CV LINE INJECTION  06/25/2023   IR IMAGING GUIDED PORT INSERTION  05/11/2022   IR REMOVE CV FIBRIN SHEATH  06/27/2023   MOUTH SURGERY     wisdom teeth extraction   MOUTH SURGERY     teeth removal   POLYPECTOMY N/A 02/09/2022   Procedure: POLYPECTOMY;  Surgeon: Toney Reil, MD;  Location: [facility];  Service: Endoscopy;  Laterality: N/A;   XI ROBOTIC ASSISTED LOWER  ANTERIOR RESECTION N/A 04/21/2022   Procedure: XI ROBOTIC ASSISTED LOWER ANTERIOR RESECTION WITH COLOSTOMY, BILATERAL TAP BLOCK, ASSESSMENT OF TISSUE PERFUSSION WITH FIREFLY INJECTION;  Surgeon: Andria Meuse, MD;  Location: [facility];  Service: General;  Laterality: N/A;    SOCIAL HISTORY: Social History   Socioeconomic History   Marital status: Married    Spouse name: Lesle Chris   Number of children: 1   Years of education: 12   Highest education level: High school graduate  Occupational History   Occupation: unemployed    Comment: disabled  Tobacco Use   Smoking status: Some Days    Current packs/day: 0.25    Average packs/day: 0.3 packs/day for 38.2 years (9.6 ttl pk-yrs)    Types: Cigarettes    Start date: 05/13/1985    Passive exposure: Current   Smokeless tobacco: Never   Tobacco comments:    5-6 cigarettes weekly- 11/07/2022  Vaping Use   Vaping status: Former   Start date: 05/25/2018   Quit date: 09/24/2018  Substance and Sexual Activity   Alcohol use: Not Currently    Alcohol/week: 4.0 standard drinks of alcohol    Types: 4 Glasses of wine per week    Comment: quit ETOH in Feb. 2023   Drug use: Yes    Types: Marijuana    Comment: 2 days ago   Sexual activity: Not Currently    Birth control/protection: Post-menopausal  Other Topics Concern   Not on file  Social History Narrative   Lives with husband and son    Social Determinants of Health   Financial Resource Strain: High Risk (08/25/2022)   Overall Financial Resource Strain (CARDIA)    Difficulty of Paying Living Expenses: Hard  Food Insecurity: Food Insecurity Present (06/06/2023)   Hunger Vital Sign    Worried About Running Out of Food in the Last Year: Sometimes true    Ran Out of Food in the Last Year: Sometimes true  Transportation Needs: No Transportation Needs (06/06/2023)   PRAPARE - Transportation    Lack of  Transportation (Medical): No    Lack of Transportation (Non-Medical): No  Recent  Concern: Transportation Needs - Unmet Transportation Needs (03/15/2023)   PRAPARE - Transportation    Lack of Transportation (Medical): Yes    Lack of Transportation (Non-Medical): Yes  Physical Activity: Insufficiently Active (08/25/2022)   Exercise Vital Sign    Days of Exercise per Week: 1 day    Minutes of Exercise per Session: 10 min  Stress: Stress Concern Present (08/25/2022)   [employer] of Occupational Health - Occupational Stress Questionnaire    Feeling of Stress : To some extent  Social Connections: Moderately Integrated (08/25/2022)   Social Connection and Isolation Panel [NHANES]    Frequency of Communication with Friends and Family: More than three times a week    Frequency of Social Gatherings with Friends and Family: Twice a week    Attends Religious Services: Never    Database administrator or Organizations: No    Attends Engineer, structural: 1 to 4 times per year    Marital Status: Married  Catering manager Violence: Not At Risk (09/20/2022)   Humiliation, Afraid, Rape, and Kick questionnaire    Fear of Current or Ex-Partner: No    Emotionally Abused: No    Physically Abused: No    Sexually Abused: No    FAMILY HISTORY: Family History  Problem Relation Age of Onset   Depression Mother    Anxiety disorder Mother    Diabetes Mother    Hypertension Mother    Hyperlipidemia Mother    Cancer Mother    Uterine cancer Mother 80   Cervical cancer Mother 20   Colon cancer Father    Depression Brother    Anxiety disorder Brother    Cancer Maternal Aunt        unk types   Diabetes Mellitus II Maternal Grandmother    Hypercholesterolemia Maternal Grandmother    Breast cancer Maternal Grandmother    Cancer Paternal Grandmother    Diabetes Paternal Grandmother    Melanoma Paternal Grandmother    Stomach cancer Paternal Grandmother     ALLERGIES:  is allergic to metformin and related, nsaids, perphenazine, sulfa antibiotics, abilify [aripiprazole], and  penicillins.  MEDICATIONS:  Current Outpatient Medications  Medication Sig Dispense Refill   ACCU-CHEK GUIDE test strip USE UP TO FOUR TIMES DAILY AS DIRECTED. 100 each 5   acetaminophen (TYLENOL) 325 MG tablet Take 650 mg by mouth every 6 (six) hours as needed for headache (pain).     albuterol (PROVENTIL) (2.5 MG/3ML) 0.083% nebulizer solution Take 3 mLs (2.5 mg total) by nebulization every 6 (six) hours as needed for shortness of breath or wheezing. 75 mL 0   albuterol (VENTOLIN HFA) 108 (90 Base) MCG/ACT inhaler Inhale 2 puffs into the lungs every 6 (six) hours as needed for wheezing or shortness of breath. 1 each 5   amantadine (SYMMETREL) 100 MG capsule Take 100 mg by mouth 2 (two) times daily.     blood glucose meter kit and supplies Dispense based on patient and insurance preference. Use up to four times daily as directed. (FOR ICD-10 E10.9, E11.9). 1 each 0   Budeson-Glycopyrrol-Formoterol (BREZTRI AEROSPHERE) 160-9-4.8 MCG/ACT AERO Inhale 2 puffs into the lungs in the morning and at bedtime. 5.9 g 11   buPROPion (WELLBUTRIN XL) 300 MG 24 hr tablet Take 300 mg by mouth daily.     Cholecalciferol (VITAMIN D-3) 125 MCG (5000 UT) TABS Take 5,000 Units by mouth daily. 30 tablet 1  clonazePAM (KLONOPIN) 0.5 MG tablet Take 0.5 mg by mouth daily.     dapagliflozin propanediol (FARXIGA) 10 MG TABS tablet Take 1 tablet (10 mg total) by mouth daily before breakfast. 90 tablet 1   dexamethasone (DECADRON) 4 MG tablet Take 2 tablets (8 mg total) by mouth See admin instructions. Take 8mg  daily for 2 days after each chemotherapy. 30 tablet 1   diltiazem (CARDIZEM CD) 120 MG 24 hr capsule Take 1 capsule (120 mg total) by mouth daily. 90 capsule 1   docusate sodium (COLACE) 100 MG capsule TAKE 1 CAPSULE BY MOUTH TWICE DAILY 60 capsule 1   gabapentin (NEURONTIN) 300 MG capsule Take 300 mg by mouth 2 (two) times daily.     Insulin Pen Needle 32G X 4 MM MISC 1 each by Does not apply route daily at 12  noon. 100 each 0   ipratropium-albuterol (DUONEB) 0.5-2.5 (3) MG/3ML SOLN Take 3 mLs by nebulization every 6 (six) hours as needed. 360 mL 11   levothyroxine (SYNTHROID) 75 MCG tablet TAKE 1 TABLET BY MOUTH DAILY BEFORE BREAKFAST 90 tablet 2   lidocaine-prilocaine (EMLA) cream Apply to affected area once 30 g 3   loperamide (IMODIUM) 2 MG capsule Take 1 capsule (2 mg total) by mouth See admin instructions. Initial: 4 mg,the 2 mg every 2 hours (4 mg every 4 hours at night)  maximum: 16 mg/day 60 capsule 2   loratadine (CLARITIN) 10 MG tablet Take 10 mg by mouth every morning.     losartan (COZAAR) 25 MG tablet TAKE 1 TABLET BY MOUTH DAILY 30 tablet 3   nystatin cream (MYCOSTATIN) Apply 1 Application topically 2 (two) times daily. 30 g 0   ondansetron (ZOFRAN) 8 MG tablet Take 1 tablet (8 mg total) by mouth every 8 (eight) hours as needed for nausea, vomiting or refractory nausea / vomiting. Start on the third day after chemotherapy. 30 tablet 1   oxybutynin (DITROPAN-XL) 10 MG 24 hr tablet Take 1 tablet (10 mg total) by mouth daily. 30 tablet 11   paliperidone (INVEGA SUSTENNA) 234 MG/1.5ML SUSY injection Inject 234 mg into the muscle every 30 (thirty) days. On or about the 14th of each month     pantoprazole (PROTONIX) 40 MG tablet TAKE 1 TABLET BY MOUTH DAILY 90 tablet 2   Pediatric Multivit-Minerals-C (CHEWABLES MULTIVITAMIN PO) Take 1 tablet by mouth daily.     pioglitazone (ACTOS) 15 MG tablet Take 1 tablet (15 mg total) by mouth daily. 90 tablet 1   promethazine (PHENERGAN) 12.5 MG tablet Take 1 tablet (12.5 mg total) by mouth every 6 (six) hours as needed for nausea or vomiting. 30 tablet 0   rosuvastatin (CRESTOR) 40 MG tablet TAKE 1 TABLET BY MOUTH EVERY MORNING 30 tablet 3   sertraline (ZOLOFT) 100 MG tablet Take 200 mg by mouth every morning.     Spacer/Aero-Holding Chambers (AEROCHAMBER MV) inhaler Use as instructed 1 each 0   VASCEPA 1 g capsule TAKE 2 CAPSULES BY MOUTH 2 TIMES DAILY  120 capsule 3   hydrocortisone cream 0.5 % Apply 1 Application topically 3 (three) times daily. 30 g 1   Hydrocortisone, Perianal, 1 % CREA Apply topically. (Patient not taking: Reported on 07/02/2023)     NON FORMULARY Pt uses a cpap nightly (Patient not taking: Reported on 07/16/2023)     No current facility-administered medications for this visit.   Facility-Administered Medications Ordered in Other Visits  Medication Dose Route Frequency Provider Last Rate Last Admin  albuterol (PROVENTIL) (2.5 MG/3ML) 0.083% nebulizer solution 2.5 mg  2.5 mg Nebulization Once Raechel Chute, MD         PHYSICAL EXAMINATION: ECOG PERFORMANCE STATUS: 0 - Asymptomatic Vitals:   07/30/23 1100  BP: 103/67  Pulse: 84  Resp: 18  Temp: (!) 96 F (35.6 C)  SpO2: 97%    Filed Weights   07/30/23 1100  Weight: 141 lb (64 kg)     Physical Exam Constitutional:      General: She is not in acute distress. HENT:     Head: Normocephalic and atraumatic.  Eyes:     General: No scleral icterus. Cardiovascular:     Rate and Rhythm: Normal rate and regular rhythm.  Pulmonary:     Effort: Pulmonary effort is normal. No respiratory distress.     Breath sounds: No wheezing.     Comments: Decreased breath sound bilaterally Abdominal:     General: Bowel sounds are normal. There is no distension.     Palpations: Abdomen is soft.     Comments: +colostomy   Musculoskeletal:        General: No deformity. Normal range of motion.     Cervical back: Normal range of motion and neck supple.  Skin:    General: Skin is warm and dry.     Findings: No erythema or rash.  Neurological:     Mental Status: She is alert and oriented to person, place, and time. Mental status is at baseline.     Cranial Nerves: No cranial nerve deficit.     Coordination: Coordination normal.  Psychiatric:        Mood and Affect: Mood normal.     LABORATORY DATA:  I have reviewed the data as listed    Latest Ref Rng & Units  07/30/2023   10:51 AM 07/16/2023    8:56 AM 07/02/2023    8:03 AM  CBC  WBC 4.0 - 10.5 K/uL 4.3  5.2  7.2   Hemoglobin 12.0 - 15.0 g/dL 40.9  81.1  91.4   Hematocrit 36.0 - 46.0 % 41.0  41.2  42.9   Platelets 150 - 400 K/uL 116  151  148       Latest Ref Rng & Units 07/30/2023   10:51 AM 07/16/2023    8:56 AM 07/02/2023    8:03 AM  CMP  Glucose 70 - 99 mg/dL 782  956  213   BUN 6 - 20 mg/dL 19  11  19    Creatinine 0.44 - 1.00 mg/dL 0.86  5.78  4.69   Sodium 135 - 145 mmol/L 136  138  137   Potassium 3.5 - 5.1 mmol/L 4.1  4.2  4.3   Chloride 98 - 111 mmol/L 102  103  102   CO2 22 - 32 mmol/L 26  25  28    Calcium 8.9 - 10.3 mg/dL 8.9  8.8  8.8   Total Protein 6.5 - 8.1 g/dL 7.0  6.8  7.1   Total Bilirubin 0.3 - 1.2 mg/dL 0.3  0.3  0.3   Alkaline Phos 38 - 126 U/L 83  87  77   AST 15 - 41 U/L 17  18  18    ALT 0 - 44 U/L 13  15  12          RADIOGRAPHIC STUDIES: I have personally reviewed the radiological images as listed and agreed with the findings in the report. No results found.

## 2023-07-30 NOTE — Assessment & Plan Note (Signed)
Recommend Imodium PRN as directed.

## 2023-07-30 NOTE — Assessment & Plan Note (Signed)
Grade 2  Continue gabapentin 300mg  BID.

## 2023-07-30 NOTE — Assessment & Plan Note (Signed)
Recommend Dexamethasone 8mg  daily for 2 days.  Apply topical hydrocortisone cream TID PRN

## 2023-07-30 NOTE — Assessment & Plan Note (Signed)
Chemotherapy plan as listed above 

## 2023-08-01 ENCOUNTER — Inpatient Hospital Stay: Payer: 59

## 2023-08-01 ENCOUNTER — Encounter: Payer: Self-pay | Admitting: Oncology

## 2023-08-01 VITALS — BP 121/79 | HR 75 | Temp 96.9°F | Resp 16

## 2023-08-01 DIAGNOSIS — K0889 Other specified disorders of teeth and supporting structures: Secondary | ICD-10-CM | POA: Diagnosis not present

## 2023-08-01 DIAGNOSIS — E1140 Type 2 diabetes mellitus with diabetic neuropathy, unspecified: Secondary | ICD-10-CM | POA: Diagnosis not present

## 2023-08-01 DIAGNOSIS — Z7952 Long term (current) use of systemic steroids: Secondary | ICD-10-CM | POA: Diagnosis not present

## 2023-08-01 DIAGNOSIS — R197 Diarrhea, unspecified: Secondary | ICD-10-CM | POA: Diagnosis not present

## 2023-08-01 DIAGNOSIS — Z79899 Other long term (current) drug therapy: Secondary | ICD-10-CM | POA: Diagnosis not present

## 2023-08-01 DIAGNOSIS — R21 Rash and other nonspecific skin eruption: Secondary | ICD-10-CM | POA: Diagnosis not present

## 2023-08-01 DIAGNOSIS — G620 Drug-induced polyneuropathy: Secondary | ICD-10-CM | POA: Diagnosis not present

## 2023-08-01 DIAGNOSIS — Z5111 Encounter for antineoplastic chemotherapy: Secondary | ICD-10-CM | POA: Diagnosis not present

## 2023-08-01 DIAGNOSIS — C20 Malignant neoplasm of rectum: Secondary | ICD-10-CM | POA: Diagnosis not present

## 2023-08-01 DIAGNOSIS — T451X5A Adverse effect of antineoplastic and immunosuppressive drugs, initial encounter: Secondary | ICD-10-CM | POA: Diagnosis not present

## 2023-08-01 DIAGNOSIS — K1230 Oral mucositis (ulcerative), unspecified: Secondary | ICD-10-CM | POA: Diagnosis not present

## 2023-08-01 DIAGNOSIS — I700 Atherosclerosis of aorta: Secondary | ICD-10-CM | POA: Diagnosis not present

## 2023-08-01 DIAGNOSIS — D122 Benign neoplasm of ascending colon: Secondary | ICD-10-CM | POA: Diagnosis not present

## 2023-08-01 DIAGNOSIS — Z7984 Long term (current) use of oral hypoglycemic drugs: Secondary | ICD-10-CM | POA: Diagnosis not present

## 2023-08-01 DIAGNOSIS — E1122 Type 2 diabetes mellitus with diabetic chronic kidney disease: Secondary | ICD-10-CM | POA: Diagnosis not present

## 2023-08-01 MED ORDER — HEPARIN SOD (PORK) LOCK FLUSH 100 UNIT/ML IV SOLN
500.0000 [IU] | Freq: Once | INTRAVENOUS | Status: AC | PRN
  Administered 2023-08-01: 500 [IU]
  Filled 2023-08-01: qty 5

## 2023-08-01 MED ORDER — SODIUM CHLORIDE 0.9% FLUSH
10.0000 mL | INTRAVENOUS | Status: DC | PRN
  Administered 2023-08-01: 10 mL
  Filled 2023-08-01: qty 10

## 2023-08-01 NOTE — Patient Instructions (Signed)

## 2023-08-02 ENCOUNTER — Encounter: Payer: Self-pay | Admitting: Urology

## 2023-08-02 ENCOUNTER — Ambulatory Visit: Payer: 59 | Admitting: Urology

## 2023-08-02 DIAGNOSIS — C20 Malignant neoplasm of rectum: Secondary | ICD-10-CM | POA: Diagnosis not present

## 2023-08-06 ENCOUNTER — Ambulatory Visit
Admission: RE | Admit: 2023-08-06 | Discharge: 2023-08-06 | Disposition: A | Discharge status: Home / Self Care | Payer: 59 | Source: Ambulatory Visit | Attending: Oncology | Admitting: Oncology

## 2023-08-06 ENCOUNTER — Inpatient Hospital Stay: Payer: 59

## 2023-08-06 DIAGNOSIS — M5116 Intervertebral disc disorders with radiculopathy, lumbar region: Secondary | ICD-10-CM | POA: Diagnosis not present

## 2023-08-06 DIAGNOSIS — M549 Dorsalgia, unspecified: Secondary | ICD-10-CM | POA: Insufficient documentation

## 2023-08-06 DIAGNOSIS — M48061 Spinal stenosis, lumbar region without neurogenic claudication: Secondary | ICD-10-CM | POA: Diagnosis not present

## 2023-08-06 DIAGNOSIS — M4726 Other spondylosis with radiculopathy, lumbar region: Secondary | ICD-10-CM | POA: Diagnosis not present

## 2023-08-06 MED ORDER — GADOBUTROL 1 MMOL/ML IV SOLN
6.0000 mL | Freq: Once | INTRAVENOUS | Status: AC | PRN
  Administered 2023-08-06: 6 mL via INTRAVENOUS

## 2023-08-06 MED ORDER — HEPARIN SOD (PORK) LOCK FLUSH 100 UNIT/ML IV SOLN
500.0000 [IU] | Freq: Once | INTRAVENOUS | Status: AC
  Administered 2023-08-06: 500 [IU] via INTRAVENOUS
  Filled 2023-08-06: qty 5

## 2023-08-06 MED ORDER — HEPARIN SOD (PORK) LOCK FLUSH 100 UNIT/ML IV SOLN
INTRAVENOUS | Status: AC
  Filled 2023-08-06: qty 5

## 2023-08-08 ENCOUNTER — Ambulatory Visit: Payer: Self-pay

## 2023-08-08 NOTE — Patient Outreach (Signed)
  Care Coordination   08/08/2023 Name: April Luna MRN: [id number] DOB: [birth year]   Care Coordination Outreach Attempts:  An unsuccessful telephone outreach was attempted for a scheduled appointment today.  Follow Up Plan:  Additional outreach attempts will be made to offer the patient care coordination information and services.   Encounter Outcome:  No Answer   Care Coordination Interventions:  No, not indicated    SIG Lysle Morales, BSW Social Worker [facility] Care Management  [phone]

## 2023-08-12 MED FILL — Dexamethasone Sodium Phosphate Inj 100 MG/10ML: INTRAMUSCULAR | Qty: 1 | Status: AC

## 2023-08-13 ENCOUNTER — Inpatient Hospital Stay: Payer: 59 | Admitting: Oncology

## 2023-08-13 ENCOUNTER — Inpatient Hospital Stay: Payer: 59

## 2023-08-13 ENCOUNTER — Encounter: Payer: Self-pay | Admitting: Oncology

## 2023-08-13 VITALS — BP 115/72 | HR 71 | Temp 95.0°F | Wt 139.7 lb

## 2023-08-13 DIAGNOSIS — K1230 Oral mucositis (ulcerative), unspecified: Secondary | ICD-10-CM | POA: Diagnosis not present

## 2023-08-13 DIAGNOSIS — G620 Drug-induced polyneuropathy: Secondary | ICD-10-CM | POA: Diagnosis not present

## 2023-08-13 DIAGNOSIS — T451X5A Adverse effect of antineoplastic and immunosuppressive drugs, initial encounter: Secondary | ICD-10-CM

## 2023-08-13 DIAGNOSIS — R21 Rash and other nonspecific skin eruption: Secondary | ICD-10-CM

## 2023-08-13 DIAGNOSIS — Z7952 Long term (current) use of systemic steroids: Secondary | ICD-10-CM | POA: Diagnosis not present

## 2023-08-13 DIAGNOSIS — C20 Malignant neoplasm of rectum: Secondary | ICD-10-CM

## 2023-08-13 DIAGNOSIS — Z79899 Other long term (current) drug therapy: Secondary | ICD-10-CM | POA: Diagnosis not present

## 2023-08-13 DIAGNOSIS — K521 Toxic gastroenteritis and colitis: Secondary | ICD-10-CM

## 2023-08-13 DIAGNOSIS — K0889 Other specified disorders of teeth and supporting structures: Secondary | ICD-10-CM

## 2023-08-13 DIAGNOSIS — Z7984 Long term (current) use of oral hypoglycemic drugs: Secondary | ICD-10-CM | POA: Diagnosis not present

## 2023-08-13 DIAGNOSIS — I700 Atherosclerosis of aorta: Secondary | ICD-10-CM | POA: Diagnosis not present

## 2023-08-13 DIAGNOSIS — R197 Diarrhea, unspecified: Secondary | ICD-10-CM | POA: Diagnosis not present

## 2023-08-13 DIAGNOSIS — D122 Benign neoplasm of ascending colon: Secondary | ICD-10-CM | POA: Diagnosis not present

## 2023-08-13 DIAGNOSIS — E1122 Type 2 diabetes mellitus with diabetic chronic kidney disease: Secondary | ICD-10-CM | POA: Diagnosis not present

## 2023-08-13 DIAGNOSIS — Z5111 Encounter for antineoplastic chemotherapy: Secondary | ICD-10-CM | POA: Diagnosis not present

## 2023-08-13 DIAGNOSIS — E1140 Type 2 diabetes mellitus with diabetic neuropathy, unspecified: Secondary | ICD-10-CM | POA: Diagnosis not present

## 2023-08-13 LAB — CBC WITH DIFFERENTIAL (CANCER CENTER ONLY)
Abs Immature Granulocytes: 0.01 10*3/uL (ref 0.00–0.07)
Basophils Absolute: 0 10*3/uL (ref 0.0–0.1)
Basophils Relative: 1 %
Eosinophils Absolute: 0.1 10*3/uL (ref 0.0–0.5)
Eosinophils Relative: 2 %
HCT: 42.3 % (ref 36.0–46.0)
Hemoglobin: 14.1 g/dL (ref 12.0–15.0)
Immature Granulocytes: 0 %
Lymphocytes Relative: 12 %
Lymphs Abs: 0.7 10*3/uL (ref 0.7–4.0)
MCH: 32.9 pg (ref 26.0–34.0)
MCHC: 33.3 g/dL (ref 30.0–36.0)
MCV: 98.6 fL (ref 80.0–100.0)
Monocytes Absolute: 0.7 10*3/uL (ref 0.1–1.0)
Monocytes Relative: 12 %
Neutro Abs: 4.2 10*3/uL (ref 1.7–7.7)
Neutrophils Relative %: 73 %
Platelet Count: 107 10*3/uL — ABNORMAL LOW (ref 150–400)
RBC: 4.29 MIL/uL (ref 3.87–5.11)
RDW: 15 % (ref 11.5–15.5)
WBC Count: 5.7 10*3/uL (ref 4.0–10.5)
nRBC: 0 % (ref 0.0–0.2)

## 2023-08-13 LAB — CMP (CANCER CENTER ONLY)
ALT: 14 U/L (ref 0–44)
AST: 15 U/L (ref 15–41)
Albumin: 4 g/dL (ref 3.5–5.0)
Alkaline Phosphatase: 83 U/L (ref 38–126)
Anion gap: 7 (ref 5–15)
BUN: 19 mg/dL (ref 6–20)
CO2: 25 mmol/L (ref 22–32)
Calcium: 8.8 mg/dL — ABNORMAL LOW (ref 8.9–10.3)
Chloride: 103 mmol/L (ref 98–111)
Creatinine: 1.2 mg/dL — ABNORMAL HIGH (ref 0.44–1.00)
GFR, Estimated: 55 mL/min — ABNORMAL LOW (ref >60)
Glucose, Bld: 83 mg/dL (ref 70–99)
Potassium: 4.2 mmol/L (ref 3.5–5.1)
Sodium: 135 mmol/L (ref 135–145)
Total Bilirubin: 0.4 mg/dL (ref 0.3–1.2)
Total Protein: 7 g/dL (ref 6.5–8.1)

## 2023-08-13 LAB — PROTEIN, URINE, RANDOM: Total Protein, Urine: <6 mg/dL

## 2023-08-13 MED ORDER — MAGIC MOUTHWASH W/LIDOCAINE: 5.0000 mL | Freq: Four times a day (QID) | ORAL | 1 refills | Status: AC | PRN

## 2023-08-13 MED ORDER — FLUOROURACIL CHEMO INJECTION 2.5 GM/50ML
400.0000 mg/m2 | Freq: Once | INTRAVENOUS | Status: AC
  Administered 2023-08-13: 650 mg via INTRAVENOUS
  Filled 2023-08-13: qty 13

## 2023-08-13 MED ORDER — SODIUM CHLORIDE 0.9 % IV SOLN
2400.0000 mg/m2 | INTRAVENOUS | Status: DC
  Administered 2023-08-13: 3850 mg via INTRAVENOUS
  Filled 2023-08-13: qty 77

## 2023-08-13 MED ORDER — ATROPINE SULFATE 1 MG/ML IV SOLN
0.5000 mg | Freq: Once | INTRAVENOUS | Status: AC | PRN
  Administered 2023-08-13: 0.5 mg via INTRAVENOUS
  Filled 2023-08-13: qty 1

## 2023-08-13 MED ORDER — PALONOSETRON HCL INJECTION 0.25 MG/5ML
0.2500 mg | Freq: Once | INTRAVENOUS | Status: AC
  Administered 2023-08-13: 0.25 mg via INTRAVENOUS
  Filled 2023-08-13: qty 5

## 2023-08-13 MED ORDER — SODIUM CHLORIDE 0.9 % IV SOLN
5.0000 mg/kg | Freq: Once | INTRAVENOUS | Status: AC
  Administered 2023-08-13: 300 mg via INTRAVENOUS
  Filled 2023-08-13: qty 12

## 2023-08-13 MED ORDER — HEPARIN SOD (PORK) LOCK FLUSH 100 UNIT/ML IV SOLN
500.0000 [IU] | Freq: Once | INTRAVENOUS | Status: DC | PRN
  Filled 2023-08-13: qty 5

## 2023-08-13 MED ORDER — SODIUM CHLORIDE 0.9 % IV SOLN
Freq: Once | INTRAVENOUS | Status: AC
  Filled 2023-08-13: qty 250

## 2023-08-13 MED ORDER — SODIUM CHLORIDE 0.9 % IV SOLN
10.0000 mg | Freq: Once | INTRAVENOUS | Status: AC
  Administered 2023-08-13: 10 mg via INTRAVENOUS
  Filled 2023-08-13: qty 10

## 2023-08-13 MED ORDER — SODIUM CHLORIDE 0.9 % IV SOLN
180.0000 mg/m2 | Freq: Once | INTRAVENOUS | Status: AC
  Administered 2023-08-13: 300 mg via INTRAVENOUS
  Filled 2023-08-13: qty 15

## 2023-08-13 MED ORDER — SODIUM CHLORIDE 0.9 % IV SOLN
400.0000 mg/m2 | Freq: Once | INTRAVENOUS | Status: AC
  Administered 2023-08-13: 644 mg via INTRAVENOUS
  Filled 2023-08-13: qty 32.2

## 2023-08-13 NOTE — Assessment & Plan Note (Signed)
 Grade 2  Continue gabapentin 300mg  BID.

## 2023-08-13 NOTE — Progress Notes (Signed)
Hematology/Oncology Progress note [phone] [phone]      CHIEF COMPLAINTS/REASON FOR VISIT:  Follow-up for rectal cancer treatments.  ASSESSMENT & PLAN:   Cancer Staging  Rectal cancer [facility]) Staging form: Colon and Rectum, AJCC 8th Edition - Pathologic stage from 05/03/2022: Stage IIIB (pT3, pN1b, cM0) - Signed by [name], MD on 05/03/2022 - Pathologic stage from 06/18/2023: Stage IVA (rpTX, pN0, pM1a) - Signed by [name], MD on 06/18/2023   Rectal cancer [facility]) History of Stage III. pT3 pN1b cM0, s/p APR. 05/2023 Stage IV current rectal adenocarcinoma Currently on adjuvant chemotherapy. S/p FOLFOX x 4 cycles S/p concurrent chemotherapy [Xeloda 1300 mg BID] with RT, and then another 4 cycles of adjuvant FOLFOX [dose reduced oxaliplatin and omit 5-FU bolus 05/2023 CT imaging indicates disease progression. liver biopsy pathology showed metastatic carcinoma--> FOLFIRI + Bevacizumab   [facility] NGS showed TP53 missense variant, APC frameshift, TCF7L2 Frameshift, FLT3 copy number gain., TMB 4.7, pMMR Wildtype KRAS/NRAS Labs are reviewed and discussed with patient. Proceed FOLFIRI + Bevacizumab today.   Encounter for antineoplastic chemotherapy Chemotherapy plan as listed above  Skin rash Recommend Dexamethasone 8mg  daily for 2 days.  Apply topical hydrocortisone cream TID PRN  Chemotherapy induced diarrhea Recommend Imodium PRN as directed.   Chemotherapy-induced neuropathy (HCC) Grade 2  Continue gabapentin 300mg  BID.   Mucositis Magic mouth wash swish and spit PRN  Tooth pain History of dental cavities. Recommend patient to follow up with dentist.    Orders Placed This Encounter  Procedures   CEA    Standing Status:   Future    Number of Occurrences:   1    Standing Expiration Date:   08/12/2024   CEA    Standing Status:   Future    Standing Expiration Date:   08/27/2024   CEA    Standing Status:   Future    Standing Expiration Date:    09/10/2024    Follow-up 2 weeks lab MD chemo  All questions were answered. The patient knows to call the clinic with any problems, questions or concerns.  [name], MD, PhD [facility] Hematology Oncology 08/13/2023      HISTORY OF PRESENTING ILLNESS:   April Luna is a  52 y.o.  female presents for treatment of rectal cancer Oncology History  Rectal cancer (HCC)  02/26/2022 Imaging   MRI PELVIS WITHOUT CONTRAST- By imaging, rectal cancer stage:  T1/T2, N0, Mx    03/02/2022 Imaging   CT CHEST AND ABDOMEN WITH CONTRAST 1. No convincing evidence of metastatic disease within the chest or abdomen. 2. Atrophic left kidney with multifocal renal scarring and cortical calcifications as well as nonobstructive renal stones measuring up to 5 mm. 3. Prominent left-sided predominant retroperitoneal lymph nodes measuring up to 8 mm near the level of the renal hilum, overall decreased in size dating back to CT September 19, 2018 and favored reactive related to left renal inflammation. 4.  Aortic Atherosclerosis (ICD10-I70.0).   03/27/2022 Genetic Testing    [facility] CustomNext+RNA cancer panel found no pathogenic mutations.    04/21/2022 Initial Diagnosis   Rectal cancer - baseline CEA 3.6 -02/09/2022, patient had colonoscopy which showed renal mass 10 cm from anal verge.  5 mm polyp in ascending colon.  Removed and retrieved. Pathology showed rectal adenocarcinoma.  The polyp in the ascending colon is a tubular adenoma.  MRI showed cT1/T2N0 disease  -04/21/2022, patient underwent robotic assisted ultralow anterior resection. Pathology showed moderately differentiated adenocarcinoma, 4.5 cm in  maximal extent, with focal extension through muscularis propria into perirectal soft tissue.  3 lymph nodes positive for metastatic carcinoma.  Negative margin.  pT3 pN1b, MSI stable.   05/03/2022 Cancer Staging   Staging form: Colon and Rectum, AJCC 8th Edition - Pathologic stage from 05/03/2022: Stage  IIIB (pT3, pN1b, cM0) - Signed by [name], MD on 05/03/2022 Stage prefix: Initial diagnosis   05/11/2022 Miscellaneous   Medi port placed by Dr.White   05/26/2022 -  Chemotherapy   FOLFOX Q2 weeks x 4   05/26/2022 - 07/09/2022 Chemotherapy   Patient is on Treatment Plan : COLORECTAL FOLFOX q14d x 8 cycles     05/26/2022 - 12/13/2022 Chemotherapy   Patient is on Treatment Plan : COLORECTAL FOLFOX q14d x 4 months     07/27/2022 -  Chemotherapy   Concurrent chemotherapy- Xeloda  1300mg  BID and radiation.    07/27/2022 - 09/12/2022 Radiation Therapy    concurrent chemotherapy [Xeloda 1300 mg BID] with RT    09/20/2022 Imaging   CT Angiogram chest PET protocol There is no evidence of pulmonary artery embolism. There is no evidence of thoracic aortic dissection.   Small linear patchy alveolar infiltrate is seen in medial segment of right middle lobe suggesting atelectasis/pneumonia.     09/28/2022 - 09/30/2022 Hospital Admission   Admission due to acute respiratory failure with hypoxia, COPD exacerbation   09/28/2022 Imaging   CT angio chest PE protocol 1. Negative for acute PE or thoracic aortic dissection. 2. New ground-glass infiltrates anteriorly in bilateral upper lobes,may represent atypical edema, infectious or inflammatory process. 3.  Aortic Atherosclerosis     12/22/2022 Imaging   CT chest abdomen pelvis w contrast  Stable exam. No evidence of recurrent or metastatic carcinoma within the chest, abdomen, or pelvis.   Left nephrolithiasis and renal parenchymal atrophy. No evidence of ureteral calculi or hydronephrosis.   Aortic Atherosclerosis   05/31/2023 Imaging   CT chest abdomen pelvis with contrast showed 1. New hypodense 16 mm lesion in the right lobe of the liver with very subtle adjacent 5 mm lesion, suspicious for metastatic disease. 2. Small bilateral pulmonary nodules, some of which were subtly evident on prior examination only in retrospect, some of which demonstrate  degree of cavitation, suspicious for metastatic disease. 3. Prominent retroperitoneal lymph nodes are similar prior. 4. Prior low anterior resection with Hartmann's pouch formation,similar small amount of perirectal/presacral soft tissue and fluid,favored postsurgical/posttreatment change. Suggest continued attention on follow-up imaging. 5. Large volume of formed stool in the colon. 6. Nonobstructive left renal calculi measure under 1 cm.     06/08/2023 Relapse/Recurrence   Ultrasound-guided liver biopsy showed Pathology showed metastatic carcinoma, morphology consistent with patient's clinical history of rectal adenocarcinoma.  [facility] NGS 648 gene panel showed TP53 missense variant, APC frameshift, TCF7L2 frameshift, FLT3 copy number gain, TMB 4.73m/MB MSI stable.  Wildtype KRAS/NRAS  [facility] RNA Seq - ERBB3 overexpressed, VEGFA overexpressed, NRAS overexpressed.     06/18/2023 Cancer Staging   Staging form: Colon and Rectum, AJCC 8th Edition - Pathologic stage from 06/18/2023: Stage IVA (rpTX, pN0, pM1a) - Signed by [name], MD on 06/18/2023 Stage prefix: Recurrence Total positive nodes: 0   06/25/2023 - 06/25/2023 Chemotherapy   Patient is on Treatment Plan : COLORECTAL FOLFOX + Bevacizumab q14d     07/02/2023 -  Chemotherapy   Patient is on Treatment Plan : COLORECTAL FOLFIRI + Bevacizumab q14d     Patient has bipolar and schizophrenia..  Patient is married  She  has housing issue due to previous history of eviction.  she does not live with her husband currently. She lives with some family members.   INTERVAL HISTORY April Luna is a 52 y.o. female who has above history reviewed by me today presents for follow up visit for rectal cancer.  She feels well today.  Denies any melena or blood in the stool. Stable neuropathy symptoms. Diarrhea managed by anti diarrhea.  Skin rash around her neck and scalp, manageable by topical steroid + mouth sore and tooth/jaw pain    Review  of Systems  Constitutional:  Positive for fatigue. Negative for appetite change, chills and fever.  HENT:   Positive for mouth sores. Negative for hearing loss and voice change.   Eyes:  Negative for eye problems.  Respiratory:  Negative for chest tightness, cough and shortness of breath.   Cardiovascular:  Negative for chest pain.  Gastrointestinal:  Positive for diarrhea. Negative for abdominal distention, abdominal pain, blood in stool and nausea.       + colostomy  Endocrine: Negative for hot flashes.  Genitourinary:  Negative for difficulty urinating and frequency.   Musculoskeletal:  Negative for arthralgias.  Skin:  Positive for rash. Negative for itching.  Neurological:  Positive for numbness. Negative for extremity weakness.  Hematological:  Negative for adenopathy.  Psychiatric/Behavioral:  Negative for confusion.     MEDICAL HISTORY:  Past Medical History:  Diagnosis Date   Allergy    pollen   Anxiety    Arthritis    right hip   Bipolar 1 disorder (HCC)    Cancer (HCC)    rectal   Chemotherapy-induced neuropathy (HCC) 10/24/2022   Chronic kidney disease    COPD (chronic obstructive pulmonary disease) (HCC)    Depression    Family history of adverse reaction to anesthesia    grand father had a stroke during anesthesia   Family history of breast cancer    Family history of colon cancer    Family history of uterine cancer    GERD (gastroesophageal reflux disease)    History of kidney stones    Hyperlipidemia    Hypertension    Hypothyroidism    Panic attack    Pneumonia    Psoriasis    Sleep apnea 08/11/2021   No CPAP   Type 2 diabetes mellitus with microalbuminuria, without long-term current use of insulin (HCC) 06/24/2019    SURGICAL HISTORY: Past Surgical History:  Procedure Laterality Date   BREAST BIOPSY Left 12/14/2021   [place] bx, venus marker, path pending   CESAREAN SECTION     COLONOSCOPY WITH PROPOFOL N/A 02/09/2022   Procedure: COLONOSCOPY WITH  PROPOFOL;  Surgeon: Toney Reil, MD;  Location: [facility];  Service: Endoscopy;  Laterality: N/A;  sleep apnea   CYSTOSCOPY W/ RETROGRADES Left 11/08/2018   Procedure: CYSTOSCOPY WITH RETROGRADE PYELOGRAM;  Surgeon: Sondra Come, MD;  Location: [facility];  Service: Urology;  Laterality: Left;   CYSTOSCOPY/URETEROSCOPY/HOLMIUM LASER/STENT PLACEMENT Left 11/08/2018   Procedure: CYSTOSCOPY/URETEROSCOPY/HOLMIUM LASER/STENT PLACEMENT;  Surgeon: Sondra Come, MD;  Location: [facility];  Service: Urology;  Laterality: Left;   IR CV LINE INJECTION  06/25/2023   IR IMAGING GUIDED PORT INSERTION  05/11/2022   IR REMOVE CV FIBRIN SHEATH  06/27/2023   MOUTH SURGERY     wisdom teeth extraction   MOUTH SURGERY     teeth removal   POLYPECTOMY N/A 02/09/2022   Procedure: POLYPECTOMY;  Surgeon: Toney Reil, MD;  Location: [facility];  Service: Endoscopy;  Laterality: N/A;   XI ROBOTIC ASSISTED LOWER ANTERIOR RESECTION N/A 04/21/2022   Procedure: XI ROBOTIC ASSISTED LOWER ANTERIOR RESECTION WITH COLOSTOMY, BILATERAL TAP BLOCK, ASSESSMENT OF TISSUE PERFUSSION WITH FIREFLY INJECTION;  Surgeon: Andria Meuse, MD;  Location: [facility];  Service: General;  Laterality: N/A;    SOCIAL HISTORY: Social History   Socioeconomic History   Marital status: Married    Spouse name: Lesle Chris   Number of children: 1   Years of education: 12   Highest education level: High school graduate  Occupational History   Occupation: unemployed    Comment: disabled  Tobacco Use   Smoking status: Some Days    Current packs/day: 0.25    Average packs/day: 0.3 packs/day for 38.2 years (9.6 ttl pk-yrs)    Types: Cigarettes    Start date: 05/13/1985    Passive exposure: Current   Smokeless tobacco: Never   Tobacco comments:    5-6 cigarettes weekly- 11/07/2022  Vaping Use   Vaping status: Former   Start date: 05/25/2018   Quit date: 09/24/2018  Substance and Sexual Activity    Alcohol use: Not Currently    Alcohol/week: 4.0 standard drinks of alcohol    Types: 4 Glasses of wine per week    Comment: quit ETOH in Feb. 2023   Drug use: Yes    Types: Marijuana    Comment: 2 days ago   Sexual activity: Not Currently    Birth control/protection: Post-menopausal  Other Topics Concern   Not on file  Social History Narrative   Lives with husband and son    Social Determinants of Health   Financial Resource Strain: High Risk (08/25/2022)   Overall Financial Resource Strain (CARDIA)    Difficulty of Paying Living Expenses: Hard  Food Insecurity: Food Insecurity Present (06/06/2023)   Hunger Vital Sign    Worried About Running Out of Food in the Last Year: Sometimes true    Ran Out of Food in the Last Year: Sometimes true  Transportation Needs: No Transportation Needs (06/06/2023)   PRAPARE - Administrator, Civil Service (Medical): No    Lack of Transportation (Non-Medical): No  Recent Concern: Transportation Needs - Unmet Transportation Needs (03/15/2023)   PRAPARE - Transportation    Lack of Transportation (Medical): Yes    Lack of Transportation (Non-Medical): Yes  Physical Activity: Insufficiently Active (08/25/2022)   Exercise Vital Sign    Days of Exercise per Week: 1 day    Minutes of Exercise per Session: 10 min  Stress: Stress Concern Present (08/25/2022)   [employer] of Occupational Health - Occupational Stress Questionnaire    Feeling of Stress : To some extent  Social Connections: Moderately Integrated (08/25/2022)   Social Connection and Isolation Panel [NHANES]    Frequency of Communication with Friends and Family: More than three times a week    Frequency of Social Gatherings with Friends and Family: Twice a week    Attends Religious Services: Never    Database administrator or Organizations: No    Attends Engineer, structural: 1 to 4 times per year    Marital Status: Married  Catering manager Violence: Not At Risk  (09/20/2022)   Humiliation, Afraid, Rape, and Kick questionnaire    Fear of Current or Ex-Partner: No    Emotionally Abused: No    Physically Abused: No    Sexually Abused: No    FAMILY HISTORY: Family  History  Problem Relation Age of Onset   Depression Mother    Anxiety disorder Mother    Diabetes Mother    Hypertension Mother    Hyperlipidemia Mother    Cancer Mother    Uterine cancer Mother 4   Cervical cancer Mother 63   Colon cancer Father    Depression Brother    Anxiety disorder Brother    Cancer Maternal Aunt        unk types   Diabetes Mellitus II Maternal Grandmother    Hypercholesterolemia Maternal Grandmother    Breast cancer Maternal Grandmother    Cancer Paternal Grandmother    Diabetes Paternal Grandmother    Melanoma Paternal Grandmother    Stomach cancer Paternal Grandmother     ALLERGIES:  is allergic to metformin and related, nsaids, perphenazine, sulfa antibiotics, abilify [aripiprazole], and penicillins.  MEDICATIONS:  Current Outpatient Medications  Medication Sig Dispense Refill   ACCU-CHEK GUIDE test strip USE UP TO FOUR TIMES DAILY AS DIRECTED. 100 each 5   acetaminophen (TYLENOL) 325 MG tablet Take 650 mg by mouth every 6 (six) hours as needed for headache (pain).     albuterol (PROVENTIL) (2.5 MG/3ML) 0.083% nebulizer solution Take 3 mLs (2.5 mg total) by nebulization every 6 (six) hours as needed for shortness of breath or wheezing. 75 mL 0   albuterol (VENTOLIN HFA) 108 (90 Base) MCG/ACT inhaler Inhale 2 puffs into the lungs every 6 (six) hours as needed for wheezing or shortness of breath. 1 each 5   amantadine (SYMMETREL) 100 MG capsule Take 100 mg by mouth 2 (two) times daily.     blood glucose meter kit and supplies Dispense based on patient and insurance preference. Use up to four times daily as directed. (FOR ICD-10 E10.9, E11.9). 1 each 0   Budeson-Glycopyrrol-Formoterol (BREZTRI AEROSPHERE) 160-9-4.8 MCG/ACT AERO Inhale 2 puffs into the  lungs in the morning and at bedtime. 5.9 g 11   buPROPion (WELLBUTRIN XL) 300 MG 24 hr tablet Take 300 mg by mouth daily.     Cholecalciferol (VITAMIN D-3) 125 MCG (5000 UT) TABS Take 5,000 Units by mouth daily. 30 tablet 1   clonazePAM (KLONOPIN) 0.5 MG tablet Take 0.5 mg by mouth daily.     dapagliflozin propanediol (FARXIGA) 10 MG TABS tablet Take 1 tablet (10 mg total) by mouth daily before breakfast. 90 tablet 1   dexamethasone (DECADRON) 4 MG tablet Take 2 tablets (8 mg total) by mouth See admin instructions. Take 8mg  daily for 2 days after each chemotherapy. 30 tablet 1   diltiazem (CARDIZEM CD) 120 MG 24 hr capsule Take 1 capsule (120 mg total) by mouth daily. 90 capsule 1   docusate sodium (COLACE) 100 MG capsule TAKE 1 CAPSULE BY MOUTH TWICE DAILY 60 capsule 1   gabapentin (NEURONTIN) 300 MG capsule Take 300 mg by mouth 2 (two) times daily.     hydrocortisone cream 0.5 % Apply 1 Application topically 3 (three) times daily. 30 g 1   Insulin Pen Needle 32G X 4 MM MISC 1 each by Does not apply route daily at 12 noon. 100 each 0   ipratropium-albuterol (DUONEB) 0.5-2.5 (3) MG/3ML SOLN Take 3 mLs by nebulization every 6 (six) hours as needed. 360 mL 11   levothyroxine (SYNTHROID) 75 MCG tablet TAKE 1 TABLET BY MOUTH DAILY BEFORE BREAKFAST 90 tablet 2   lidocaine-prilocaine (EMLA) cream Apply to affected area once 30 g 3   loperamide (IMODIUM) 2 MG capsule Take 1 capsule (2  mg total) by mouth See admin instructions. Initial: 4 mg,the 2 mg every 2 hours (4 mg every 4 hours at night)  maximum: 16 mg/day 60 capsule 2   loratadine (CLARITIN) 10 MG tablet Take 10 mg by mouth every morning.     losartan (COZAAR) 25 MG tablet TAKE 1 TABLET BY MOUTH DAILY 30 tablet 3   magic mouthwash w/lidocaine SOLN Take 5 mLs by mouth 4 (four) times daily as needed for mouth pain. Sig: Swish/Swallow 5-10 ml four times a day as needed. Dispense 480 ml. 1RF 480 mL 1   nystatin cream (MYCOSTATIN) Apply 1 Application  topically 2 (two) times daily. 30 g 0   ondansetron (ZOFRAN) 8 MG tablet Take 1 tablet (8 mg total) by mouth every 8 (eight) hours as needed for nausea, vomiting or refractory nausea / vomiting. Start on the third day after chemotherapy. 30 tablet 1   oxybutynin (DITROPAN-XL) 10 MG 24 hr tablet Take 1 tablet (10 mg total) by mouth daily. 30 tablet 11   paliperidone (INVEGA SUSTENNA) 234 MG/1.5ML SUSY injection Inject 234 mg into the muscle every 30 (thirty) days. On or about the 14th of each month     pantoprazole (PROTONIX) 40 MG tablet TAKE 1 TABLET BY MOUTH DAILY 90 tablet 2   Pediatric Multivit-Minerals-C (CHEWABLES MULTIVITAMIN PO) Take 1 tablet by mouth daily.     pioglitazone (ACTOS) 15 MG tablet Take 1 tablet (15 mg total) by mouth daily. 90 tablet 1   promethazine (PHENERGAN) 12.5 MG tablet Take 1 tablet (12.5 mg total) by mouth every 6 (six) hours as needed for nausea or vomiting. 30 tablet 0   rosuvastatin (CRESTOR) 40 MG tablet TAKE 1 TABLET BY MOUTH EVERY MORNING 30 tablet 3   sertraline (ZOLOFT) 100 MG tablet Take 200 mg by mouth every morning.     Spacer/Aero-Holding Chambers (AEROCHAMBER MV) inhaler Use as instructed 1 each 0   VASCEPA 1 g capsule TAKE 2 CAPSULES BY MOUTH 2 TIMES DAILY 120 capsule 3   Hydrocortisone, Perianal, 1 % CREA Apply topically. (Patient not taking: Reported on 07/02/2023)     NON FORMULARY Pt uses a cpap nightly (Patient not taking: Reported on 07/16/2023)     No current facility-administered medications for this visit.   Facility-Administered Medications Ordered in Other Visits  Medication Dose Route Frequency Provider Last Rate Last Admin   albuterol (PROVENTIL) (2.5 MG/3ML) 0.083% nebulizer solution 2.5 mg  2.5 mg Nebulization Once Raechel Chute, MD       atropine injection 0.5 mg  0.5 mg Intravenous Once PRN [name], MD       dexamethasone (DECADRON) 10 mg in sodium chloride 0.9 % 50 mL IVPB  10 mg Intravenous Once [name], MD 204 mL/hr at 08/13/23  0935 10 mg at 08/13/23 0935   heparin lock flush 100 unit/mL  500 Units Intracatheter Once PRN [name], MD       palonosetron (ALOXI) injection 0.25 mg  0.25 mg Intravenous Once [name], MD         PHYSICAL EXAMINATION: ECOG PERFORMANCE STATUS: 0 - Asymptomatic Vitals:   08/13/23 0849  BP: 115/72  Pulse: 71  Temp: (!) 95 F (35 C)  SpO2: 100%    Filed Weights   08/13/23 0849  Weight: 139 lb 11.2 oz (63.4 kg)     Physical Exam Constitutional:      General: She is not in acute distress. HENT:     Head: Normocephalic and atraumatic.  Mouth/Throat:     Comments: mucositis Eyes:     General: No scleral icterus. Cardiovascular:     Rate and Rhythm: Normal rate and regular rhythm.  Pulmonary:     Effort: Pulmonary effort is normal. No respiratory distress.     Breath sounds: No wheezing.     Comments: Decreased breath sound bilaterally Abdominal:     General: Bowel sounds are normal. There is no distension.     Palpations: Abdomen is soft.     Comments: +colostomy   Musculoskeletal:        General: No deformity. Normal range of motion.     Cervical back: Normal range of motion and neck supple.  Skin:    General: Skin is warm and dry.     Findings: No erythema or rash.  Neurological:     Mental Status: She is alert and oriented to person, place, and time. Mental status is at baseline.     Cranial Nerves: No cranial nerve deficit.     Coordination: Coordination normal.  Psychiatric:        Mood and Affect: Mood normal.     LABORATORY DATA:  I have reviewed the data as listed    Latest Ref Rng & Units 08/13/2023    8:25 AM 07/30/2023   10:51 AM 07/16/2023    8:56 AM  CBC  WBC 4.0 - 10.5 K/uL 5.7  4.3  5.2   Hemoglobin 12.0 - 15.0 g/dL 16.1  09.6  04.5   Hematocrit 36.0 - 46.0 % 42.3  41.0  41.2   Platelets 150 - 400 K/uL 107  116  151       Latest Ref Rng & Units 08/13/2023    8:25 AM 07/30/2023   10:51 AM 07/16/2023    8:56 AM  CMP  Glucose 70 - 99  mg/dL 83  409  811   BUN 6 - 20 mg/dL 19  19  11    Creatinine 0.44 - 1.00 mg/dL 9.14  7.82  9.56   Sodium 135 - 145 mmol/L 135  136  138   Potassium 3.5 - 5.1 mmol/L 4.2  4.1  4.2   Chloride 98 - 111 mmol/L 103  102  103   CO2 22 - 32 mmol/L 25  26  25    Calcium 8.9 - 10.3 mg/dL 8.8  8.9  8.8   Total Protein 6.5 - 8.1 g/dL 7.0  7.0  6.8   Total Bilirubin 0.3 - 1.2 mg/dL 0.4  0.3  0.3   Alkaline Phos 38 - 126 U/L 83  83  87   AST 15 - 41 U/L 15  17  18    ALT 0 - 44 U/L 14  13  15          RADIOGRAPHIC STUDIES: I have personally reviewed the radiological images as listed and agreed with the findings in the report. No results found.

## 2023-08-13 NOTE — Assessment & Plan Note (Signed)
Chemotherapy plan as listed above 

## 2023-08-13 NOTE — Assessment & Plan Note (Signed)
Magic mouth wash swish and spit PRN

## 2023-08-13 NOTE — Assessment & Plan Note (Signed)
Recommend Imodium PRN as directed.

## 2023-08-13 NOTE — Assessment & Plan Note (Addendum)
History of Stage III. pT3 pN1b cM0, s/p APR. 05/2023 Stage IV current rectal adenocarcinoma Currently on adjuvant chemotherapy. S/p FOLFOX x 4 cycles S/p concurrent chemotherapy [Xeloda 1300 mg BID] with RT, and then another 4 cycles of adjuvant FOLFOX [dose reduced oxaliplatin and omit 5-FU bolus 05/2023 CT imaging indicates disease progression. liver biopsy pathology showed metastatic carcinoma--> FOLFIRI + Bevacizumab   [facility] NGS showed TP53 missense variant, APC frameshift, TCF7L2 Frameshift, FLT3 copy number gain., TMB 4.7, pMMR Wildtype KRAS/NRAS Labs are reviewed and discussed with patient. Proceed FOLFIRI + Bevacizumab today.

## 2023-08-13 NOTE — Progress Notes (Signed)
1115: Okay to start Irinotecan and Leucovorin while waiting on Urine protein for Bevacizumab. Cathie Hoops, MD   1110: patient was able to provide a urine sample.

## 2023-08-13 NOTE — Assessment & Plan Note (Signed)
History of dental cavities. Recommend patient to follow up with dentist.

## 2023-08-13 NOTE — Assessment & Plan Note (Signed)
 Recommend Dexamethasone 8mg  daily for 2 days.  Apply topical hydrocortisone cream TID PRN

## 2023-08-14 LAB — CEA: CEA: 6.5 ng/mL — ABNORMAL HIGH (ref 0.0–4.7)

## 2023-08-15 ENCOUNTER — Inpatient Hospital Stay: Payer: 59

## 2023-08-15 DIAGNOSIS — E1122 Type 2 diabetes mellitus with diabetic chronic kidney disease: Secondary | ICD-10-CM | POA: Diagnosis not present

## 2023-08-15 DIAGNOSIS — G620 Drug-induced polyneuropathy: Secondary | ICD-10-CM | POA: Diagnosis not present

## 2023-08-15 DIAGNOSIS — K0889 Other specified disorders of teeth and supporting structures: Secondary | ICD-10-CM | POA: Diagnosis not present

## 2023-08-15 DIAGNOSIS — C20 Malignant neoplasm of rectum: Secondary | ICD-10-CM | POA: Diagnosis not present

## 2023-08-15 DIAGNOSIS — Z933 Colostomy status: Secondary | ICD-10-CM | POA: Diagnosis not present

## 2023-08-15 DIAGNOSIS — T451X5A Adverse effect of antineoplastic and immunosuppressive drugs, initial encounter: Secondary | ICD-10-CM | POA: Diagnosis not present

## 2023-08-15 DIAGNOSIS — K1230 Oral mucositis (ulcerative), unspecified: Secondary | ICD-10-CM | POA: Diagnosis not present

## 2023-08-15 DIAGNOSIS — R21 Rash and other nonspecific skin eruption: Secondary | ICD-10-CM | POA: Diagnosis not present

## 2023-08-15 DIAGNOSIS — R197 Diarrhea, unspecified: Secondary | ICD-10-CM | POA: Diagnosis not present

## 2023-08-15 DIAGNOSIS — Z7984 Long term (current) use of oral hypoglycemic drugs: Secondary | ICD-10-CM | POA: Diagnosis not present

## 2023-08-15 DIAGNOSIS — Z7952 Long term (current) use of systemic steroids: Secondary | ICD-10-CM | POA: Diagnosis not present

## 2023-08-15 DIAGNOSIS — D122 Benign neoplasm of ascending colon: Secondary | ICD-10-CM | POA: Diagnosis not present

## 2023-08-15 DIAGNOSIS — I700 Atherosclerosis of aorta: Secondary | ICD-10-CM | POA: Diagnosis not present

## 2023-08-15 DIAGNOSIS — E1140 Type 2 diabetes mellitus with diabetic neuropathy, unspecified: Secondary | ICD-10-CM | POA: Diagnosis not present

## 2023-08-15 DIAGNOSIS — Z5111 Encounter for antineoplastic chemotherapy: Secondary | ICD-10-CM | POA: Diagnosis not present

## 2023-08-15 DIAGNOSIS — Z79899 Other long term (current) drug therapy: Secondary | ICD-10-CM | POA: Diagnosis not present

## 2023-08-15 MED ORDER — SODIUM CHLORIDE 0.9% FLUSH
10.0000 mL | INTRAVENOUS | Status: DC | PRN
  Administered 2023-08-15: 10 mL
  Filled 2023-08-15: qty 10

## 2023-08-15 MED ORDER — HEPARIN SOD (PORK) LOCK FLUSH 100 UNIT/ML IV SOLN
500.0000 [IU] | Freq: Once | INTRAVENOUS | Status: AC | PRN
  Administered 2023-08-15: 500 [IU]
  Filled 2023-08-15: qty 5

## 2023-08-21 ENCOUNTER — Encounter: Payer: Self-pay | Admitting: Oncology

## 2023-08-28 ENCOUNTER — Inpatient Hospital Stay: Payer: 59 | Attending: Oncology

## 2023-08-28 ENCOUNTER — Inpatient Hospital Stay: Payer: 59

## 2023-08-28 ENCOUNTER — Inpatient Hospital Stay (HOSPITAL_BASED_OUTPATIENT_CLINIC_OR_DEPARTMENT_OTHER): Payer: 59 | Admitting: Oncology

## 2023-08-28 ENCOUNTER — Encounter: Payer: Self-pay | Admitting: Oncology

## 2023-08-28 VITALS — BP 116/76 | HR 70 | Temp 96.1°F | Resp 18 | Wt 135.6 lb

## 2023-08-28 DIAGNOSIS — T451X5A Adverse effect of antineoplastic and immunosuppressive drugs, initial encounter: Secondary | ICD-10-CM

## 2023-08-28 DIAGNOSIS — E1122 Type 2 diabetes mellitus with diabetic chronic kidney disease: Secondary | ICD-10-CM | POA: Diagnosis not present

## 2023-08-28 DIAGNOSIS — N200 Calculus of kidney: Secondary | ICD-10-CM | POA: Diagnosis not present

## 2023-08-28 DIAGNOSIS — E039 Hypothyroidism, unspecified: Secondary | ICD-10-CM | POA: Insufficient documentation

## 2023-08-28 DIAGNOSIS — I700 Atherosclerosis of aorta: Secondary | ICD-10-CM | POA: Diagnosis not present

## 2023-08-28 DIAGNOSIS — R300 Dysuria: Secondary | ICD-10-CM | POA: Diagnosis not present

## 2023-08-28 DIAGNOSIS — C20 Malignant neoplasm of rectum: Secondary | ICD-10-CM | POA: Insufficient documentation

## 2023-08-28 DIAGNOSIS — Z87442 Personal history of urinary calculi: Secondary | ICD-10-CM | POA: Insufficient documentation

## 2023-08-28 DIAGNOSIS — Z5111 Encounter for antineoplastic chemotherapy: Secondary | ICD-10-CM | POA: Diagnosis not present

## 2023-08-28 DIAGNOSIS — F17210 Nicotine dependence, cigarettes, uncomplicated: Secondary | ICD-10-CM | POA: Diagnosis not present

## 2023-08-28 DIAGNOSIS — C787 Secondary malignant neoplasm of liver and intrahepatic bile duct: Secondary | ICD-10-CM | POA: Diagnosis not present

## 2023-08-28 DIAGNOSIS — G4730 Sleep apnea, unspecified: Secondary | ICD-10-CM | POA: Insufficient documentation

## 2023-08-28 DIAGNOSIS — Z9221 Personal history of antineoplastic chemotherapy: Secondary | ICD-10-CM | POA: Insufficient documentation

## 2023-08-28 DIAGNOSIS — K0889 Other specified disorders of teeth and supporting structures: Secondary | ICD-10-CM | POA: Insufficient documentation

## 2023-08-28 DIAGNOSIS — Z79890 Hormone replacement therapy (postmenopausal): Secondary | ICD-10-CM | POA: Insufficient documentation

## 2023-08-28 DIAGNOSIS — F319 Bipolar disorder, unspecified: Secondary | ICD-10-CM | POA: Diagnosis not present

## 2023-08-28 DIAGNOSIS — R21 Rash and other nonspecific skin eruption: Secondary | ICD-10-CM | POA: Insufficient documentation

## 2023-08-28 DIAGNOSIS — E1140 Type 2 diabetes mellitus with diabetic neuropathy, unspecified: Secondary | ICD-10-CM | POA: Insufficient documentation

## 2023-08-28 DIAGNOSIS — I10 Essential (primary) hypertension: Secondary | ICD-10-CM | POA: Insufficient documentation

## 2023-08-28 DIAGNOSIS — M48061 Spinal stenosis, lumbar region without neurogenic claudication: Secondary | ICD-10-CM | POA: Insufficient documentation

## 2023-08-28 DIAGNOSIS — K219 Gastro-esophageal reflux disease without esophagitis: Secondary | ICD-10-CM | POA: Diagnosis not present

## 2023-08-28 DIAGNOSIS — F209 Schizophrenia, unspecified: Secondary | ICD-10-CM | POA: Diagnosis not present

## 2023-08-28 DIAGNOSIS — G620 Drug-induced polyneuropathy: Secondary | ICD-10-CM | POA: Insufficient documentation

## 2023-08-28 DIAGNOSIS — D122 Benign neoplasm of ascending colon: Secondary | ICD-10-CM | POA: Diagnosis not present

## 2023-08-28 DIAGNOSIS — M47816 Spondylosis without myelopathy or radiculopathy, lumbar region: Secondary | ICD-10-CM | POA: Diagnosis not present

## 2023-08-28 DIAGNOSIS — Z803 Family history of malignant neoplasm of breast: Secondary | ICD-10-CM | POA: Insufficient documentation

## 2023-08-28 DIAGNOSIS — Z800 Family history of malignant neoplasm of digestive organs: Secondary | ICD-10-CM | POA: Insufficient documentation

## 2023-08-28 DIAGNOSIS — R197 Diarrhea, unspecified: Secondary | ICD-10-CM | POA: Insufficient documentation

## 2023-08-28 DIAGNOSIS — Z79899 Other long term (current) drug therapy: Secondary | ICD-10-CM | POA: Insufficient documentation

## 2023-08-28 DIAGNOSIS — Z7984 Long term (current) use of oral hypoglycemic drugs: Secondary | ICD-10-CM | POA: Insufficient documentation

## 2023-08-28 LAB — CBC WITH DIFFERENTIAL (CANCER CENTER ONLY)
Abs Immature Granulocytes: 0.01 10*3/uL (ref 0.00–0.07)
Basophils Absolute: 0 10*3/uL (ref 0.0–0.1)
Basophils Relative: 1 %
Eosinophils Absolute: 0.1 10*3/uL (ref 0.0–0.5)
Eosinophils Relative: 1 %
HCT: 40.3 % (ref 36.0–46.0)
Hemoglobin: 13.5 g/dL (ref 12.0–15.0)
Immature Granulocytes: 0 %
Lymphocytes Relative: 13 %
Lymphs Abs: 0.5 10*3/uL — ABNORMAL LOW (ref 0.7–4.0)
MCH: 33.2 pg (ref 26.0–34.0)
MCHC: 33.5 g/dL (ref 30.0–36.0)
MCV: 99 fL (ref 80.0–100.0)
Monocytes Absolute: 0.6 10*3/uL (ref 0.1–1.0)
Monocytes Relative: 18 %
Neutro Abs: 2.3 10*3/uL (ref 1.7–7.7)
Neutrophils Relative %: 67 %
Platelet Count: 125 10*3/uL — ABNORMAL LOW (ref 150–400)
RBC: 4.07 MIL/uL (ref 3.87–5.11)
RDW: 15.7 % — ABNORMAL HIGH (ref 11.5–15.5)
WBC Count: 3.5 10*3/uL — ABNORMAL LOW (ref 4.0–10.5)
nRBC: 0 % (ref 0.0–0.2)

## 2023-08-28 LAB — CMP (CANCER CENTER ONLY)
ALT: 13 U/L (ref 0–44)
AST: 16 U/L (ref 15–41)
Albumin: 3.7 g/dL (ref 3.5–5.0)
Alkaline Phosphatase: 88 U/L (ref 38–126)
Anion gap: 9 (ref 5–15)
BUN: 13 mg/dL (ref 6–20)
CO2: 25 mmol/L (ref 22–32)
Calcium: 8.7 mg/dL — ABNORMAL LOW (ref 8.9–10.3)
Chloride: 102 mmol/L (ref 98–111)
Creatinine: 1.14 mg/dL — ABNORMAL HIGH (ref 0.44–1.00)
GFR, Estimated: 59 mL/min — ABNORMAL LOW (ref >60)
Glucose, Bld: 149 mg/dL — ABNORMAL HIGH (ref 70–99)
Potassium: 4.2 mmol/L (ref 3.5–5.1)
Sodium: 136 mmol/L (ref 135–145)
Total Bilirubin: 0.2 mg/dL — ABNORMAL LOW (ref 0.3–1.2)
Total Protein: 6.7 g/dL (ref 6.5–8.1)

## 2023-08-28 LAB — PROTEIN, URINE, RANDOM: Total Protein, Urine: 11 mg/dL

## 2023-08-28 MED ORDER — HEPARIN SOD (PORK) LOCK FLUSH 100 UNIT/ML IV SOLN
500.0000 [IU] | Freq: Once | INTRAVENOUS | Status: AC
  Administered 2023-08-28: 500 [IU] via INTRAVENOUS
  Filled 2023-08-28: qty 5

## 2023-08-28 NOTE — Assessment & Plan Note (Signed)
 History of dental cavities. Recommend patient to follow up with dentist.

## 2023-08-28 NOTE — Assessment & Plan Note (Addendum)
History of Stage III. pT3 pN1b cM0, s/p APR. 05/2023 Stage IV current rectal adenocarcinoma Currently on adjuvant chemotherapy. S/p FOLFOX x 4 cycles S/p concurrent chemotherapy [Xeloda 1300 mg BID] with RT, and then another 4 cycles of adjuvant FOLFOX [dose reduced oxaliplatin and omit 5-FU bolus 05/2023 CT imaging indicates disease progression. liver biopsy pathology showed metastatic carcinoma--> FOLFIRI + Bevacizumab   [facility] NGS showed TP53 missense variant, APC frameshift, TCF7L2 Frameshift, FLT3 copy number gain., TMB 4.7, pMMR Wildtype KRAS/NRAS Labs are reviewed and discussed with patient. Hold off  FOLFIRI + Bevacizumab due to tooth pain/infection. Recommend patient to see dentist.

## 2023-08-28 NOTE — Progress Notes (Signed)
Hematology/Oncology Progress note [phone]) [phone] [phone]) [phone]      CHIEF COMPLAINTS/REASON FOR VISIT:  Follow-up for rectal cancer treatments.  ASSESSMENT & PLAN:   Cancer Staging  Rectal cancer [facility]) Staging form: Colon and Rectum, AJCC 8th Edition - Pathologic stage from 05/03/2022: Stage IIIB (pT3, pN1b, cM0) - Signed by [name], MD on 05/03/2022 - Pathologic stage from 06/18/2023: Stage IVA (rpTX, pN0, pM1a) - Signed by [name], MD on 06/18/2023   Rectal cancer [facility]) History of Stage III. pT3 pN1b cM0, s/p APR. 05/2023 Stage IV current rectal adenocarcinoma Currently on adjuvant chemotherapy. S/p FOLFOX x 4 cycles S/p concurrent chemotherapy [Xeloda 1300 mg BID] with RT, and then another 4 cycles of adjuvant FOLFOX [dose reduced oxaliplatin and omit 5-FU bolus 05/2023 CT imaging indicates disease progression. liver biopsy pathology showed metastatic carcinoma--> FOLFIRI + Bevacizumab   [employer] NGS showed TP53 missense variant, APC frameshift, TCF7L2 Frameshift, FLT3 copy number gain., TMB 4.7, pMMR Wildtype KRAS/NRAS Labs are reviewed and discussed with patient. Hold off  FOLFIRI + Bevacizumab due to tooth pain/infection. Recommend patient to see dentist.   Encounter for antineoplastic chemotherapy Chemotherapy plan as listed above  Chemotherapy-induced neuropathy (HCC) Grade 2  Continue gabapentin 300mg  BID.   Tooth pain History of dental cavities. Recommend patient to follow up with dentist.    Orders Placed This Encounter  Procedures   Protein, urine, random    Standing Status:   Future    Standing Expiration Date:   09/02/2024   CBC with Differential (Cancer Center Only)    Standing Status:   Future    Standing Expiration Date:   09/02/2024   CMP (Cancer Center only)    Standing Status:   Future    Standing Expiration Date:   09/02/2024    Follow-up 1w lab MD chemo  All questions were answered. The patient knows to call the clinic with any problems,  questions or concerns.  [name], MD, PhD [facility] Hematology Oncology 08/28/2023      HISTORY OF PRESENTING ILLNESS:   April Luna is a  52 y.o.  female presents for treatment of rectal cancer Oncology History  Rectal cancer (HCC)  02/26/2022 Imaging   MRI PELVIS WITHOUT CONTRAST- By imaging, rectal cancer stage:  T1/T2, N0, Mx    03/02/2022 Imaging   CT CHEST AND ABDOMEN WITH CONTRAST 1. No convincing evidence of metastatic disease within the chest or abdomen. 2. Atrophic left kidney with multifocal renal scarring and cortical calcifications as well as nonobstructive renal stones measuring up to 5 mm. 3. Prominent left-sided predominant retroperitoneal lymph nodes measuring up to 8 mm near the level of the renal hilum, overall decreased in size dating back to CT September 19, 2018 and favored reactive related to left renal inflammation. 4.  Aortic Atherosclerosis (ICD10-I70.0).   03/27/2022 Genetic Testing    [facility] CustomNext+RNA cancer panel found no pathogenic mutations.    04/21/2022 Initial Diagnosis   Rectal cancer - baseline CEA 3.6 -02/09/2022, patient had colonoscopy which showed renal mass 10 cm from anal verge.  5 mm polyp in ascending colon.  Removed and retrieved. Pathology showed rectal adenocarcinoma.  The polyp in the ascending colon is a tubular adenoma.  MRI showed cT1/T2N0 disease  -04/21/2022, patient underwent robotic assisted ultralow anterior resection. Pathology showed moderately differentiated adenocarcinoma, 4.5 cm in maximal extent, with focal extension through muscularis propria into perirectal soft tissue.  3 lymph nodes positive for metastatic carcinoma.  Negative margin.  pT3 pN1b,  MSI stable.   05/03/2022 Cancer Staging   Staging form: Colon and Rectum, AJCC 8th Edition - Pathologic stage from 05/03/2022: Stage IIIB (pT3, pN1b, cM0) - Signed by [name], MD on 05/03/2022 Stage prefix: Initial diagnosis   05/11/2022 Miscellaneous   Medi port  placed by Dr.White   05/26/2022 -  Chemotherapy   FOLFOX Q2 weeks x 4   05/26/2022 - 07/09/2022 Chemotherapy   Patient is on Treatment Plan : COLORECTAL FOLFOX q14d x 8 cycles     05/26/2022 - 12/13/2022 Chemotherapy   Patient is on Treatment Plan : COLORECTAL FOLFOX q14d x 4 months     07/27/2022 -  Chemotherapy   Concurrent chemotherapy- Xeloda  1300mg  BID and radiation.    07/27/2022 - 09/12/2022 Radiation Therapy    concurrent chemotherapy [Xeloda 1300 mg BID] with RT    09/20/2022 Imaging   CT Angiogram chest PET protocol There is no evidence of pulmonary artery embolism. There is no evidence of thoracic aortic dissection.   Small linear patchy alveolar infiltrate is seen in medial segment of right middle lobe suggesting atelectasis/pneumonia.     09/28/2022 - 09/30/2022 Hospital Admission   Admission due to acute respiratory failure with hypoxia, COPD exacerbation   09/28/2022 Imaging   CT angio chest PE protocol 1. Negative for acute PE or thoracic aortic dissection. 2. New ground-glass infiltrates anteriorly in bilateral upper lobes,may represent atypical edema, infectious or inflammatory process. 3.  Aortic Atherosclerosis     12/22/2022 Imaging   CT chest abdomen pelvis w contrast  Stable exam. No evidence of recurrent or metastatic carcinoma within the chest, abdomen, or pelvis.   Left nephrolithiasis and renal parenchymal atrophy. No evidence of ureteral calculi or hydronephrosis.   Aortic Atherosclerosis   05/31/2023 Imaging   CT chest abdomen pelvis with contrast showed 1. New hypodense 16 mm lesion in the right lobe of the liver with very subtle adjacent 5 mm lesion, suspicious for metastatic disease. 2. Small bilateral pulmonary nodules, some of which were subtly evident on prior examination only in retrospect, some of which demonstrate degree of cavitation, suspicious for metastatic disease. 3. Prominent retroperitoneal lymph nodes are similar prior. 4. Prior low  anterior resection with Hartmann's pouch formation,similar small amount of perirectal/presacral soft tissue and fluid,favored postsurgical/posttreatment change. Suggest continued attention on follow-up imaging. 5. Large volume of formed stool in the colon. 6. Nonobstructive left renal calculi measure under 1 cm.     06/08/2023 Relapse/Recurrence   Ultrasound-guided liver biopsy showed Pathology showed metastatic carcinoma, morphology consistent with patient's clinical history of rectal adenocarcinoma.  [employer] NGS 648 gene panel showed TP53 missense variant, APC frameshift, TCF7L2 frameshift, FLT3 copy number gain, TMB 4.25m/MB MSI stable.  Wildtype KRAS/NRAS  [employer] RNA Seq - ERBB3 overexpressed, VEGFA overexpressed, NRAS overexpressed.     06/18/2023 Cancer Staging   Staging form: Colon and Rectum, AJCC 8th Edition - Pathologic stage from 06/18/2023: Stage IVA (rpTX, pN0, pM1a) - Signed by [name], MD on 06/18/2023 Stage prefix: Recurrence Total positive nodes: 0   06/25/2023 - 06/25/2023 Chemotherapy   Patient is on Treatment Plan : COLORECTAL FOLFOX + Bevacizumab q14d     07/02/2023 -  Chemotherapy   Patient is on Treatment Plan : COLORECTAL FOLFIRI + Bevacizumab q14d     Patient has bipolar and schizophrenia..  Patient is married  She has housing issue due to previous history of eviction.  she does not live with her husband currently. She lives with some family members.  INTERVAL HISTORY April Luna is a 52 y.o. female who has above history reviewed by me today presents for follow up visit for rectal cancer.  She feels well today.  Denies any melena or blood in the stool. Stable neuropathy symptoms. Diarrhea managed by anti diarrhea.  Skin rash around her neck and scalp, manageable by topical steroid + tooth pain, she plans to see dentisit. No fever or chills.     Review of Systems  Constitutional:  Positive for fatigue. Negative for appetite change, chills and fever.   HENT:   Positive for mouth sores. Negative for hearing loss and voice change.   Eyes:  Negative for eye problems.  Respiratory:  Negative for chest tightness, cough and shortness of breath.   Cardiovascular:  Negative for chest pain.  Gastrointestinal:  Positive for diarrhea. Negative for abdominal distention, abdominal pain, blood in stool and nausea.       + colostomy  Endocrine: Negative for hot flashes.  Genitourinary:  Negative for difficulty urinating and frequency.   Musculoskeletal:  Negative for arthralgias.  Skin:  Positive for rash. Negative for itching.  Neurological:  Positive for numbness. Negative for extremity weakness.  Hematological:  Negative for adenopathy.  Psychiatric/Behavioral:  Negative for confusion.     MEDICAL HISTORY:  Past Medical History:  Diagnosis Date   Allergy    pollen   Anxiety    Arthritis    right hip   Bipolar 1 disorder (HCC)    Cancer (HCC)    rectal   Chemotherapy-induced neuropathy (HCC) 10/24/2022   Chronic kidney disease    COPD (chronic obstructive pulmonary disease) (HCC)    Depression    Family history of adverse reaction to anesthesia    grand father had a stroke during anesthesia   Family history of breast cancer    Family history of colon cancer    Family history of uterine cancer    GERD (gastroesophageal reflux disease)    History of kidney stones    Hyperlipidemia    Hypertension    Hypothyroidism    Panic attack    Pneumonia    Psoriasis    Sleep apnea 08/11/2021   No CPAP   Type 2 diabetes mellitus with microalbuminuria, without long-term current use of insulin (HCC) 06/24/2019    SURGICAL HISTORY: Past Surgical History:  Procedure Laterality Date   BREAST BIOPSY Left 12/14/2021   [place] bx, venus marker, path pending   CESAREAN SECTION     COLONOSCOPY WITH PROPOFOL N/A 02/09/2022   Procedure: COLONOSCOPY WITH PROPOFOL;  Surgeon: Toney Reil, MD;  Location: [facility];  Service: Endoscopy;   Laterality: N/A;  sleep apnea   CYSTOSCOPY W/ RETROGRADES Left 11/08/2018   Procedure: CYSTOSCOPY WITH RETROGRADE PYELOGRAM;  Surgeon: Sondra Come, MD;  Location: [facility];  Service: Urology;  Laterality: Left;   CYSTOSCOPY/URETEROSCOPY/HOLMIUM LASER/STENT PLACEMENT Left 11/08/2018   Procedure: CYSTOSCOPY/URETEROSCOPY/HOLMIUM LASER/STENT PLACEMENT;  Surgeon: Sondra Come, MD;  Location: [facility];  Service: Urology;  Laterality: Left;   IR CV LINE INJECTION  06/25/2023   IR IMAGING GUIDED PORT INSERTION  05/11/2022   IR REMOVE CV FIBRIN SHEATH  06/27/2023   MOUTH SURGERY     wisdom teeth extraction   MOUTH SURGERY     teeth removal   POLYPECTOMY N/A 02/09/2022   Procedure: POLYPECTOMY;  Surgeon: Toney Reil, MD;  Location: [facility];  Service: Endoscopy;  Laterality: N/A;   XI ROBOTIC ASSISTED LOWER ANTERIOR RESECTION N/A  04/21/2022   Procedure: XI ROBOTIC ASSISTED LOWER ANTERIOR RESECTION WITH COLOSTOMY, BILATERAL TAP BLOCK, ASSESSMENT OF TISSUE PERFUSSION WITH FIREFLY INJECTION;  Surgeon: Andria Meuse, MD;  Location: [facility];  Service: General;  Laterality: N/A;    SOCIAL HISTORY: Social History   Socioeconomic History   Marital status: Married    Spouse name: Lesle Chris   Number of children: 1   Years of education: 12   Highest education level: High school graduate  Occupational History   Occupation: unemployed    Comment: disabled  Tobacco Use   Smoking status: Some Days    Current packs/day: 0.25    Average packs/day: 0.3 packs/day for 38.3 years (9.6 ttl pk-yrs)    Types: Cigarettes    Start date: 05/13/1985    Passive exposure: Current   Smokeless tobacco: Never   Tobacco comments:    5-6 cigarettes weekly- 11/07/2022  Vaping Use   Vaping status: Former   Start date: 05/25/2018   Quit date: 09/24/2018  Substance and Sexual Activity   Alcohol use: Not Currently    Alcohol/week: 4.0 standard drinks of alcohol    Types: 4 Glasses of  wine per week    Comment: quit ETOH in Feb. 2023   Drug use: Yes    Types: Marijuana    Comment: 2 days ago   Sexual activity: Not Currently    Birth control/protection: Post-menopausal  Other Topics Concern   Not on file  Social History Narrative   Lives with husband and son    Social Determinants of Health   Financial Resource Strain: High Risk (08/25/2022)   Overall Financial Resource Strain (CARDIA)    Difficulty of Paying Living Expenses: Hard  Food Insecurity: Food Insecurity Present (06/06/2023)   Hunger Vital Sign    Worried About Running Out of Food in the Last Year: Sometimes true    Ran Out of Food in the Last Year: Sometimes true  Transportation Needs: No Transportation Needs (06/06/2023)   PRAPARE - Administrator, Civil Service (Medical): No    Lack of Transportation (Non-Medical): No  Recent Concern: Transportation Needs - Unmet Transportation Needs (03/15/2023)   PRAPARE - Transportation    Lack of Transportation (Medical): Yes    Lack of Transportation (Non-Medical): Yes  Physical Activity: Insufficiently Active (08/25/2022)   Exercise Vital Sign    Days of Exercise per Week: 1 day    Minutes of Exercise per Session: 10 min  Stress: Stress Concern Present (08/25/2022)   [employer] of Occupational Health - Occupational Stress Questionnaire    Feeling of Stress : To some extent  Social Connections: Moderately Integrated (08/25/2022)   Social Connection and Isolation Panel [NHANES]    Frequency of Communication with Friends and Family: More than three times a week    Frequency of Social Gatherings with Friends and Family: Twice a week    Attends Religious Services: Never    Database administrator or Organizations: No    Attends Engineer, structural: 1 to 4 times per year    Marital Status: Married  Catering manager Violence: Not At Risk (09/20/2022)   Humiliation, Afraid, Rape, and Kick questionnaire    Fear of Current or Ex-Partner: No     Emotionally Abused: No    Physically Abused: No    Sexually Abused: No    FAMILY HISTORY: Family History  Problem Relation Age of Onset   Depression Mother    Anxiety disorder Mother  Diabetes Mother    Hypertension Mother    Hyperlipidemia Mother    Cancer Mother    Uterine cancer Mother 59   Cervical cancer Mother 52   Colon cancer Father    Depression Brother    Anxiety disorder Brother    Cancer Maternal Aunt        unk types   Diabetes Mellitus II Maternal Grandmother    Hypercholesterolemia Maternal Grandmother    Breast cancer Maternal Grandmother    Cancer Paternal Grandmother    Diabetes Paternal Grandmother    Melanoma Paternal Grandmother    Stomach cancer Paternal Grandmother     ALLERGIES:  is allergic to metformin and related, nsaids, perphenazine, sulfa antibiotics, abilify [aripiprazole], and penicillins.  MEDICATIONS:  Current Outpatient Medications  Medication Sig Dispense Refill   ACCU-CHEK GUIDE test strip USE UP TO FOUR TIMES DAILY AS DIRECTED. 100 each 5   acetaminophen (TYLENOL) 325 MG tablet Take 650 mg by mouth every 6 (six) hours as needed for headache (pain).     albuterol (PROVENTIL) (2.5 MG/3ML) 0.083% nebulizer solution Take 3 mLs (2.5 mg total) by nebulization every 6 (six) hours as needed for shortness of breath or wheezing. 75 mL 0   albuterol (VENTOLIN HFA) 108 (90 Base) MCG/ACT inhaler Inhale 2 puffs into the lungs every 6 (six) hours as needed for wheezing or shortness of breath. 1 each 5   amantadine (SYMMETREL) 100 MG capsule Take 100 mg by mouth 2 (two) times daily.     blood glucose meter kit and supplies Dispense based on patient and insurance preference. Use up to four times daily as directed. (FOR ICD-10 E10.9, E11.9). 1 each 0   Budeson-Glycopyrrol-Formoterol (BREZTRI AEROSPHERE) 160-9-4.8 MCG/ACT AERO Inhale 2 puffs into the lungs in the morning and at bedtime. 5.9 g 11   buPROPion (WELLBUTRIN XL) 300 MG 24 hr tablet Take 300 mg  by mouth daily.     Cholecalciferol (VITAMIN D-3) 125 MCG (5000 UT) TABS Take 5,000 Units by mouth daily. 30 tablet 1   clonazePAM (KLONOPIN) 0.5 MG tablet Take 0.5 mg by mouth daily.     dapagliflozin propanediol (FARXIGA) 10 MG TABS tablet Take 1 tablet (10 mg total) by mouth daily before breakfast. 90 tablet 1   dexamethasone (DECADRON) 4 MG tablet Take 2 tablets (8 mg total) by mouth See admin instructions. Take 8mg  daily for 2 days after each chemotherapy. 30 tablet 1   diltiazem (CARDIZEM CD) 120 MG 24 hr capsule Take 1 capsule (120 mg total) by mouth daily. 90 capsule 1   docusate sodium (COLACE) 100 MG capsule TAKE 1 CAPSULE BY MOUTH TWICE DAILY 60 capsule 1   gabapentin (NEURONTIN) 300 MG capsule Take 300 mg by mouth 2 (two) times daily.     hydrocortisone cream 0.5 % Apply 1 Application topically 3 (three) times daily. 30 g 1   Insulin Pen Needle 32G X 4 MM MISC 1 each by Does not apply route daily at 12 noon. 100 each 0   ipratropium-albuterol (DUONEB) 0.5-2.5 (3) MG/3ML SOLN Take 3 mLs by nebulization every 6 (six) hours as needed. 360 mL 11   levothyroxine (SYNTHROID) 75 MCG tablet TAKE 1 TABLET BY MOUTH DAILY BEFORE BREAKFAST 90 tablet 2   lidocaine-prilocaine (EMLA) cream Apply to affected area once 30 g 3   loperamide (IMODIUM) 2 MG capsule Take 1 capsule (2 mg total) by mouth See admin instructions. Initial: 4 mg,the 2 mg every 2 hours (4 mg every 4 hours  at night)  maximum: 16 mg/day 60 capsule 2   loratadine (CLARITIN) 10 MG tablet Take 10 mg by mouth every morning.     losartan (COZAAR) 25 MG tablet TAKE 1 TABLET BY MOUTH DAILY 30 tablet 3   nystatin cream (MYCOSTATIN) Apply 1 Application topically 2 (two) times daily. 30 g 0   ondansetron (ZOFRAN) 8 MG tablet Take 1 tablet (8 mg total) by mouth every 8 (eight) hours as needed for nausea, vomiting or refractory nausea / vomiting. Start on the third day after chemotherapy. 30 tablet 1   oxybutynin (DITROPAN-XL) 10 MG 24 hr tablet  Take 1 tablet (10 mg total) by mouth daily. 30 tablet 11   paliperidone (INVEGA SUSTENNA) 234 MG/1.5ML SUSY injection Inject 234 mg into the muscle every 30 (thirty) days. On or about the 14th of each month     pantoprazole (PROTONIX) 40 MG tablet TAKE 1 TABLET BY MOUTH DAILY 90 tablet 2   Pediatric Multivit-Minerals-C (CHEWABLES MULTIVITAMIN PO) Take 1 tablet by mouth daily.     pioglitazone (ACTOS) 15 MG tablet Take 1 tablet (15 mg total) by mouth daily. 90 tablet 1   promethazine (PHENERGAN) 12.5 MG tablet Take 1 tablet (12.5 mg total) by mouth every 6 (six) hours as needed for nausea or vomiting. 30 tablet 0   rosuvastatin (CRESTOR) 40 MG tablet TAKE 1 TABLET BY MOUTH EVERY MORNING 30 tablet 3   sertraline (ZOLOFT) 100 MG tablet Take 200 mg by mouth every morning.     Spacer/Aero-Holding Chambers (AEROCHAMBER MV) inhaler Use as instructed 1 each 0   VASCEPA 1 g capsule TAKE 2 CAPSULES BY MOUTH 2 TIMES DAILY 120 capsule 3   Hydrocortisone, Perianal, 1 % CREA Apply topically. (Patient not taking: Reported on 07/02/2023)     magic mouthwash w/lidocaine SOLN Take 5 mLs by mouth 4 (four) times daily as needed for mouth pain. Sig: Swish/Swallow 5-10 ml four times a day as needed. Dispense 480 ml. 1RF (Patient not taking: Reported on 08/28/2023) 480 mL 1   NON FORMULARY Pt uses a cpap nightly (Patient not taking: Reported on 07/16/2023)     No current facility-administered medications for this visit.   Facility-Administered Medications Ordered in Other Visits  Medication Dose Route Frequency Provider Last Rate Last Admin   albuterol (PROVENTIL) (2.5 MG/3ML) 0.083% nebulizer solution 2.5 mg  2.5 mg Nebulization Once Raechel Chute, MD       sodium chloride flush (NS) 0.9 % injection 10 mL  10 mL Intracatheter PRN [name], MD   10 mL at 08/15/23 1327     PHYSICAL EXAMINATION: ECOG PERFORMANCE STATUS: 0 - Asymptomatic Vitals:   08/28/23 0852  BP: 116/76  Pulse: 70  Resp: 18  Temp: (!) 96.1 F  (35.6 C)  SpO2: 97%    Filed Weights   08/28/23 0852  Weight: 135 lb 9.6 oz (61.5 kg)     Physical Exam Constitutional:      General: She is not in acute distress. HENT:     Head: Normocephalic and atraumatic.     Mouth/Throat:     Comments: mucositis Eyes:     General: No scleral icterus. Cardiovascular:     Rate and Rhythm: Normal rate and regular rhythm.  Pulmonary:     Effort: Pulmonary effort is normal. No respiratory distress.     Breath sounds: No wheezing.     Comments: Decreased breath sound bilaterally Abdominal:     General: Bowel sounds are normal. There is no  distension.     Palpations: Abdomen is soft.     Comments: +colostomy   Musculoskeletal:        General: No deformity. Normal range of motion.     Cervical back: Normal range of motion and neck supple.  Skin:    General: Skin is warm and dry.     Findings: No erythema or rash.  Neurological:     Mental Status: She is alert and oriented to person, place, and time. Mental status is at baseline.     Cranial Nerves: No cranial nerve deficit.     Coordination: Coordination normal.  Psychiatric:        Mood and Affect: Mood normal.     LABORATORY DATA:  I have reviewed the data as listed    Latest Ref Rng & Units 08/28/2023    8:37 AM 08/13/2023    8:25 AM 07/30/2023   10:51 AM  CBC  WBC 4.0 - 10.5 K/uL 3.5  5.7  4.3   Hemoglobin 12.0 - 15.0 g/dL 16.1  09.6  04.5   Hematocrit 36.0 - 46.0 % 40.3  42.3  41.0   Platelets 150 - 400 K/uL 125  107  116       Latest Ref Rng & Units 08/28/2023    8:37 AM 08/13/2023    8:25 AM 07/30/2023   10:51 AM  CMP  Glucose 70 - 99 mg/dL 409  83  811   BUN 6 - 20 mg/dL 13  19  19    Creatinine 0.44 - 1.00 mg/dL 9.14  7.82  9.56   Sodium 135 - 145 mmol/L 136  135  136   Potassium 3.5 - 5.1 mmol/L 4.2  4.2  4.1   Chloride 98 - 111 mmol/L 102  103  102   CO2 22 - 32 mmol/L 25  25  26    Calcium 8.9 - 10.3 mg/dL 8.7  8.8  8.9   Total Protein 6.5 - 8.1 g/dL 6.7  7.0  7.0    Total Bilirubin 0.3 - 1.2 mg/dL 0.2  0.4  0.3   Alkaline Phos 38 - 126 U/L 88  83  83   AST 15 - 41 U/L 16  15  17    ALT 0 - 44 U/L 13  14  13          RADIOGRAPHIC STUDIES: I have personally reviewed the radiological images as listed and agreed with the findings in the report. MR Lumbar Spine W Wo Contrast  Result Date: 08/19/2023 CLINICAL DATA:  Low back pain radiating into hips bilaterally, right greater than left, history of rectal cancer EXAM: MRI LUMBAR SPINE WITHOUT AND WITH CONTRAST TECHNIQUE: Multiplanar and multiecho pulse sequences of the lumbar spine were obtained without and with intravenous contrast. CONTRAST:  6mL GADAVIST GADOBUTROL 1 MMOL/ML IV SOLN COMPARISON:  No prior MRI of the lumbar spine available. Correlation is made with 05/31/2023 CT chest abdomen pelvis. FINDINGS: Segmentation:  5 lumbar type vertebral bodies. Alignment: Levocurvature of the lumbar spine. Straightening of the normal lumbar lordosis. Vertebrae: No acute fracture, evidence of discitis, or suspicious osseous lesion. No abnormal enhancement. Endplate degenerative changes eccentric to the right at L1-L2. Conus medullaris and cauda equina: Conus extends to the L1 level. Conus and cauda equina appear normal. No abnormal enhancement. Paraspinal and other soft tissues: Small cysts in the bilateral kidneys, for which no follow-up is currently indicated. As seen on the 05/31/2023 CT chest abdomen pelvis, there are prominent retroperitoneal lymph nodes,  with a previously noted index left periaortic lymph node measuring 9 mm, unchanged. Disc levels: T11-T12: Seen only on the sagittal images. No significant disc bulge, spinal canal stenosis, or neural foraminal narrowing. T12-L1: No significant disc bulge. No spinal canal stenosis or neural foraminal narrowing. L1-L2: Mild disc bulge with superimposed central/right paracentral disc protrusion. Narrowing of the right lateral recess. Mild spinal canal stenosis. No neural  foraminal narrowing. L2-L3: No significant disc bulge. No spinal canal stenosis or neural foraminal narrowing. L3-L4: No significant disc bulge. Left extreme lateral annular fissure, which may irritate the adjacent left L3 nerve roots. Mild facet arthropathy. No spinal canal stenosis or neural foraminal narrowing. L4-L5: Minimal disc bulge. Mild facet arthropathy. Narrowing of the lateral recesses. No spinal canal stenosis or neural foraminal narrowing. L5-S1: No significant disc bulge. Moderate right facet arthropathy. Narrowing of the left-greater-than-right lateral recess. No spinal canal stenosis or neural foraminal narrowing. IMPRESSION: 1. L1-L2 mild spinal canal stenosis. Narrowing of the right lateral recess at this level could affect the descending right L2 nerve roots. 2. L3-L4 left extreme lateral annular fissure, which may irritate the adjacent left L3 nerve roots. 3. Narrowing of the lateral recesses at L4-L5 and L5-S1 could affect the descending L5 and S1 nerve roots, respectively. 4. No evidence of metastatic disease in the lumbar spine. Electronically Signed   By: Wiliam Ke M.D.   On: 08/19/2023 02:18

## 2023-08-28 NOTE — Assessment & Plan Note (Signed)
Chemotherapy plan as listed above 

## 2023-08-28 NOTE — Assessment & Plan Note (Signed)
 Grade 2  Continue gabapentin 300mg  BID.

## 2023-08-29 LAB — CEA: CEA: 6.7 ng/mL — ABNORMAL HIGH (ref 0.0–4.7)

## 2023-08-30 ENCOUNTER — Inpatient Hospital Stay: Payer: 59

## 2023-08-30 ENCOUNTER — Ambulatory Visit (INDEPENDENT_AMBULATORY_CARE_PROVIDER_SITE_OTHER): Payer: 59

## 2023-08-30 VITALS — BP 102/64 | Ht 59.0 in | Wt 137.5 lb

## 2023-08-30 DIAGNOSIS — Z0000 Encounter for general adult medical examination without abnormal findings: Secondary | ICD-10-CM | POA: Diagnosis not present

## 2023-08-30 DIAGNOSIS — Z59819 Housing instability, housed unspecified: Secondary | ICD-10-CM | POA: Diagnosis not present

## 2023-08-30 NOTE — Progress Notes (Signed)
Subjective:   April Luna is a 52 y.o. female who presents for Medicare Annual (Subsequent) preventive examination.  Visit Complete: In person  Patient Medicare AWV questionnaire was completed by the patient on (not done); I have confirmed that all information answered by patient is correct and no changes since this date.  Review of Systems    Cardiac Risk Factors include: advanced age (>50men, >82 women);diabetes mellitus;dyslipidemia;hypertension;sedentary lifestyle;smoking/ tobacco exposure     Objective:    Today's Vitals   08/30/23 1257 08/30/23 1300  BP: 102/64   Weight: 137 lb 8 oz (62.4 kg)   Height: 4\' 11"  (1.499 m)   PainSc:  2    Body mass index is 27.77 kg/m.     08/30/2023    1:24 PM 07/16/2023    9:29 AM 07/02/2023    8:27 AM 06/20/2023   10:39 AM 06/05/2023   10:18 AM 03/05/2023   10:08 AM 12/11/2022    9:29 AM  Advanced Directives  Does Patient Have a Medical Advance Directive? No  No No No No No  Would patient like information on creating a medical advance directive?  No - Patient declined No - Patient declined No - Patient declined No - Patient declined No - Patient declined     Current Medications (verified) Outpatient Encounter Medications as of 08/30/2023  Medication Sig   ACCU-CHEK GUIDE test strip USE UP TO FOUR TIMES DAILY AS DIRECTED.   acetaminophen (TYLENOL) 325 MG tablet Take 650 mg by mouth every 6 (six) hours as needed for headache (pain).   albuterol (VENTOLIN HFA) 108 (90 Base) MCG/ACT inhaler Inhale 2 puffs into the lungs every 6 (six) hours as needed for wheezing or shortness of breath.   amantadine (SYMMETREL) 100 MG capsule Take 100 mg by mouth 2 (two) times daily.   blood glucose meter kit and supplies Dispense based on patient and insurance preference. Use up to four times daily as directed. (FOR ICD-10 E10.9, E11.9).   Budeson-Glycopyrrol-Formoterol (BREZTRI AEROSPHERE) 160-9-4.8 MCG/ACT AERO Inhale 2 puffs into the lungs in the  morning and at bedtime.   buPROPion (WELLBUTRIN XL) 300 MG 24 hr tablet Take 300 mg by mouth daily.   Cholecalciferol (VITAMIN D-3) 125 MCG (5000 UT) TABS Take 5,000 Units by mouth daily.   clonazePAM (KLONOPIN) 0.5 MG tablet Take 0.5 mg by mouth daily.   dapagliflozin propanediol (FARXIGA) 10 MG TABS tablet Take 1 tablet (10 mg total) by mouth daily before breakfast.   dexamethasone (DECADRON) 4 MG tablet Take 2 tablets (8 mg total) by mouth See admin instructions. Take 8mg  daily for 2 days after each chemotherapy.   diltiazem (CARDIZEM CD) 120 MG 24 hr capsule Take 1 capsule (120 mg total) by mouth daily.   docusate sodium (COLACE) 100 MG capsule TAKE 1 CAPSULE BY MOUTH TWICE DAILY   gabapentin (NEURONTIN) 300 MG capsule Take 300 mg by mouth 2 (two) times daily.   hydrocortisone cream 0.5 % Apply 1 Application topically 3 (three) times daily.   Hydrocortisone, Perianal, 1 % CREA Apply topically.   Insulin Pen Needle 32G X 4 MM MISC 1 each by Does not apply route daily at 12 noon.   ipratropium-albuterol (DUONEB) 0.5-2.5 (3) MG/3ML SOLN Take 3 mLs by nebulization every 6 (six) hours as needed.   levothyroxine (SYNTHROID) 75 MCG tablet TAKE 1 TABLET BY MOUTH DAILY BEFORE BREAKFAST   lidocaine-prilocaine (EMLA) cream Apply to affected area once   loperamide (IMODIUM) 2 MG capsule Take 1 capsule (  2 mg total) by mouth See admin instructions. Initial: 4 mg,the 2 mg every 2 hours (4 mg every 4 hours at night)  maximum: 16 mg/day   loratadine (CLARITIN) 10 MG tablet Take 10 mg by mouth every morning.   losartan (COZAAR) 25 MG tablet TAKE 1 TABLET BY MOUTH DAILY   magic mouthwash w/lidocaine SOLN Take 5 mLs by mouth 4 (four) times daily as needed for mouth pain. Sig: Swish/Swallow 5-10 ml four times a day as needed. Dispense 480 ml. 1RF   NON FORMULARY Pt uses a cpap nightly   nystatin cream (MYCOSTATIN) Apply 1 Application topically 2 (two) times daily.   ondansetron (ZOFRAN) 8 MG tablet Take 1 tablet  (8 mg total) by mouth every 8 (eight) hours as needed for nausea, vomiting or refractory nausea / vomiting. Start on the third day after chemotherapy.   oxybutynin (DITROPAN-XL) 10 MG 24 hr tablet Take 1 tablet (10 mg total) by mouth daily.   paliperidone (INVEGA SUSTENNA) 234 MG/1.5ML SUSY injection Inject 234 mg into the muscle every 30 (thirty) days. On or about the 14th of each month   pantoprazole (PROTONIX) 40 MG tablet TAKE 1 TABLET BY MOUTH DAILY   Pediatric Multivit-Minerals-C (CHEWABLES MULTIVITAMIN PO) Take 1 tablet by mouth daily.   pioglitazone (ACTOS) 15 MG tablet Take 1 tablet (15 mg total) by mouth daily.   promethazine (PHENERGAN) 12.5 MG tablet Take 1 tablet (12.5 mg total) by mouth every 6 (six) hours as needed for nausea or vomiting.   rosuvastatin (CRESTOR) 40 MG tablet TAKE 1 TABLET BY MOUTH EVERY MORNING   sertraline (ZOLOFT) 100 MG tablet Take 200 mg by mouth every morning.   Spacer/Aero-Holding Chambers (AEROCHAMBER MV) inhaler Use as instructed   VASCEPA 1 g capsule TAKE 2 CAPSULES BY MOUTH 2 TIMES DAILY   albuterol (PROVENTIL) (2.5 MG/3ML) 0.083% nebulizer solution Take 3 mLs (2.5 mg total) by nebulization every 6 (six) hours as needed for shortness of breath or wheezing.   Facility-Administered Encounter Medications as of 08/30/2023  Medication   albuterol (PROVENTIL) (2.5 MG/3ML) 0.083% nebulizer solution 2.5 mg   sodium chloride flush (NS) 0.9 % injection 10 mL    Allergies (verified) Metformin and related, Nsaids, Perphenazine, Sulfa antibiotics, Abilify [aripiprazole], and Penicillins   History: Past Medical History:  Diagnosis Date   Allergy    pollen   Anxiety    Arthritis    right hip   Bipolar 1 disorder (HCC)    Cancer (HCC)    rectal   Chemotherapy-induced neuropathy (HCC) 10/24/2022   Chronic kidney disease    COPD (chronic obstructive pulmonary disease) (HCC)    Depression    Family history of adverse reaction to anesthesia    grand father  had a stroke during anesthesia   Family history of breast cancer    Family history of colon cancer    Family history of uterine cancer    GERD (gastroesophageal reflux disease)    History of kidney stones    Hyperlipidemia    Hypertension    Hypothyroidism    Panic attack    Pneumonia    Psoriasis    Sleep apnea 08/11/2021   No CPAP   Type 2 diabetes mellitus with microalbuminuria, without long-term current use of insulin (HCC) 06/24/2019   Past Surgical History:  Procedure Laterality Date   BREAST BIOPSY Left 12/14/2021   [place] bx, venus marker, path pending   CESAREAN SECTION     COLONOSCOPY WITH PROPOFOL N/A 02/09/2022  Procedure: COLONOSCOPY WITH PROPOFOL;  Surgeon: Toney Reil, MD;  Location: [facility];  Service: Endoscopy;  Laterality: N/A;  sleep apnea   CYSTOSCOPY W/ RETROGRADES Left 11/08/2018   Procedure: CYSTOSCOPY WITH RETROGRADE PYELOGRAM;  Surgeon: Sondra Come, MD;  Location: [facility];  Service: Urology;  Laterality: Left;   CYSTOSCOPY/URETEROSCOPY/HOLMIUM LASER/STENT PLACEMENT Left 11/08/2018   Procedure: CYSTOSCOPY/URETEROSCOPY/HOLMIUM LASER/STENT PLACEMENT;  Surgeon: Sondra Come, MD;  Location: [facility];  Service: Urology;  Laterality: Left;   IR CV LINE INJECTION  06/25/2023   IR IMAGING GUIDED PORT INSERTION  05/11/2022   IR REMOVE CV FIBRIN SHEATH  06/27/2023   MOUTH SURGERY     wisdom teeth extraction   MOUTH SURGERY     teeth removal   POLYPECTOMY N/A 02/09/2022   Procedure: POLYPECTOMY;  Surgeon: Toney Reil, MD;  Location: [facility];  Service: Endoscopy;  Laterality: N/A;   XI ROBOTIC ASSISTED LOWER ANTERIOR RESECTION N/A 04/21/2022   Procedure: XI ROBOTIC ASSISTED LOWER ANTERIOR RESECTION WITH COLOSTOMY, BILATERAL TAP BLOCK, ASSESSMENT OF TISSUE PERFUSSION WITH FIREFLY INJECTION;  Surgeon: Andria Meuse, MD;  Location: [facility];  Service: General;  Laterality: N/A;   Family History  Problem Relation Age of  Onset   Depression Mother    Anxiety disorder Mother    Diabetes Mother    Hypertension Mother    Hyperlipidemia Mother    Cancer Mother    Uterine cancer Mother 35   Cervical cancer Mother 35   Colon cancer Father    Depression Brother    Anxiety disorder Brother    Cancer Maternal Aunt        unk types   Diabetes Mellitus II Maternal Grandmother    Hypercholesterolemia Maternal Grandmother    Breast cancer Maternal Grandmother    Cancer Paternal Grandmother    Diabetes Paternal Grandmother    Melanoma Paternal Grandmother    Stomach cancer Paternal Grandmother    Social History   Socioeconomic History   Marital status: Married    Spouse name: Lesle Chris   Number of children: 1   Years of education: 12   Highest education level: High school graduate  Occupational History   Occupation: unemployed    Comment: disabled  Tobacco Use   Smoking status: Some Days    Current packs/day: 0.25    Average packs/day: 0.3 packs/day for 38.3 years (9.6 ttl pk-yrs)    Types: Cigarettes    Start date: 05/13/1985    Passive exposure: Current   Smokeless tobacco: Never   Tobacco comments:    5-6 cigarettes weekly- 11/07/2022  Vaping Use   Vaping status: Former   Start date: 05/25/2018   Quit date: 09/24/2018  Substance and Sexual Activity   Alcohol use: Not Currently    Alcohol/week: 4.0 standard drinks of alcohol    Types: 4 Glasses of wine per week    Comment: quit ETOH in Feb. 2023   Drug use: Yes    Types: Marijuana    Comment: 2 days ago   Sexual activity: Not Currently    Birth control/protection: Post-menopausal  Other Topics Concern   Not on file  Social History Narrative   Lives with husband and son    Social Determinants of Health   Financial Resource Strain: High Risk (08/30/2023)   Overall Financial Resource Strain (CARDIA)    Difficulty of Paying Living Expenses: Hard  Food Insecurity: Food Insecurity Present (08/30/2023)   Hunger Vital Sign  Worried  About Programme researcher, broadcasting/film/video in the Last Year: Sometimes true    [employer] of Food in the Last Year: Sometimes true  Transportation Needs: No Transportation Needs (08/30/2023)   PRAPARE - Administrator, Civil Service (Medical): No    Lack of Transportation (Non-Medical): No  Physical Activity: Inactive (08/30/2023)   Exercise Vital Sign    Days of Exercise per Week: 0 days    Minutes of Exercise per Session: 0 min  Stress: Stress Concern Present (08/30/2023)   [employer] of Occupational Health - Occupational Stress Questionnaire    Feeling of Stress : Very much  Social Connections: Moderately Integrated (08/30/2023)   Social Connection and Isolation Panel [NHANES]    Frequency of Communication with Friends and Family: Never    Frequency of Social Gatherings with Friends and Family: Once a week    Attends Religious Services: More than 4 times per year    Active Member of [employer] or Organizations: No    Attends Engineer, structural: 1 to 4 times per year    Marital Status: Married    Tobacco Counseling Ready to quit: Not Answered Counseling given: Not Answered Tobacco comments: 5-6 cigarettes weekly- 11/07/2022   Clinical Intake:  Pre-visit preparation completed: Yes  Pain : 0-10 Pain Score: 2  Pain Type: Acute pain Pain Location: Teeth Pain Descriptors / Indicators: Aching Pain Onset: 1 to 4 weeks ago Pain Frequency: Intermittent Pain Relieving Factors: avoind eating on rt side  Pain Relieving Factors: avoind eating on rt side  BMI - recorded: 27.77 Nutritional Status: BMI 25 -29 Overweight Nutritional Risks: Nausea/ vomitting/ diarrhea (with chemo) Diabetes: Yes CBG done?: No Did pt. bring in CBG monitor from home?: No  How often do you need to have someone help you when you read instructions, pamphlets, or other written materials from your doctor or pharmacy?: 1 - Never  Interpreter Needed?: No  Comments: lives with father, son and  aunt Information entered by :: B.Aliyanna Wassmer,LPN   Activities of Daily Living    08/30/2023    1:24 PM 06/08/2023    9:05 AM  In your present state of health, do you have any difficulty performing the following activities:  Hearing? 0 0  Vision? 0 0  Difficulty concentrating or making decisions? 1 0  Walking or climbing stairs? 1 0  Dressing or bathing? 0 0  Doing errands, shopping? 1   Preparing Food and eating ? N   Using the Toilet? N   In the past six months, have you accidently leaked urine? Y   Do you have problems with loss of bowel control? Y   Managing your Medications? N   Managing your Finances? N   Housekeeping or managing your Housekeeping? Y     Patient Care Team: Alba Cory, MD as PCP - General (Family Medicine) Alfredo Martinez, MD as Consulting Physician (Urology) Benita Gutter, RN as Oncology Nurse Navigator [name], MD as Consulting Physician (Oncology) Carmina Miller, MD as Consulting Physician (Radiation Oncology) Frederich Chick [facility] (Psychiatry) Coralyn Helling, MD as Consulting Physician (Pulmonary Disease) Andria Meuse, MD as Consulting Physician (General Surgery) Toney Reil, MD as Consulting Physician (Gastroenterology) Dalbert Garnet as Social Worker [place], Angelina Sheriff as Social Worker Pa, [facility] (Optometry)  Indicate any recent Medical Services you may have received from other than [facility] providers in the past year (date may be approximate).     Assessment:  This is a routine wellness examination for Santel.  Hearing/Vision screen Hearing Screening - Comments:: Adequate hearing Vision Screening - Comments:: Adequate vision [facility]   Goals Addressed               This Visit's Progress     Find housing, consolidate/reduce [facility] bills, food assistance   Not on track     -Patient wants help with available housing -Patient wants financial resources to help with  funds to move -Patient wants food assistance -Patient wants to reduce medical bills from [facility] Coordination Interventions: Patient is working with [employer] for housing Patient is working with [employer] for financial assistance Patient is working with [employer] to reduce medical bills  Care Coordination Interventions: Patient is working with her provider to obtain documentation to qualify for a discount on housing.   Patient is working with [employer] to resolve an eviction on her credit  report Patient has been approved for $[facility] from [facility] Patient will communicate with [facility] regarding several bills Patient to apply for Medicaid today with support from [employer]         Increase physical activity   Not on track     Pt would like to increase physical activity by starting to walk at least 3 days per week       Make and Keep All Appointments (pt-stated)   On track     Timeframe:  Long-Range Goal Priority:  High Start Date:    12/02/21                         Expected End Date:                    Follow Up Date    - arrange a ride through an agency 1 week before appointment - call to cancel if needed - keep a calendar with appointment dates -continue to utilize the [facility] team and your transportation benefit on her [facility] plan to arrange transportation Why is this important?   Part of staying healthy is seeing the doctor for follow-up care.  If you forget your appointments, there are some things you can do to stay on track.    Notes:        Depression Screen    08/30/2023    1:18 PM 06/04/2023    3:44 PM 04/11/2023   11:32 AM 12/26/2022    2:07 PM 11/10/2022    2:52 PM 11/03/2022   10:00 AM 10/05/2022   10:12 AM  PHQ 2/9 Scores  PHQ - 2 Score 0 0 0 2 2 1 2   PHQ- 9 Score  0 0 8 6 1 6     Fall Risk    08/30/2023    1:07 PM 06/04/2023    3:44 PM 04/11/2023   11:32 AM 12/26/2022    2:06 PM 11/10/2022    2:52  PM  Fall Risk   Falls in the past year? 0 0 0 0 0  Number falls in past yr: 0 0 0 0   Injury with Fall? 0 0 0 0   Risk for fall due to : No Fall Risks   No Fall Risks No Fall Risks  Follow up Education provided;Falls prevention discussed   Falls prevention discussed Falls prevention discussed;Education provided;Falls evaluation completed    MEDICARE RISK AT HOME: Medicare Risk at Home Any stairs  in or around the home?: No If so, are there any without handrails?: No Home free of loose throw rugs in walkways, pet beds, electrical cords, etc?: Yes Adequate lighting in your home to reduce risk of falls?: Yes Life alert?: No Use of a cane, walker or w/c?: No Grab bars in the bathroom?: Yes Shower chair or bench in shower?: Yes (does not use) Elevated toilet seat or a handicapped toilet?: No  TIMED UP AND GO:  Was the test performed?  Yes  Length of time to ambulate 10 feet: 12 sec Gait slow and steady without use of assistive device    Cognitive Function:    08/25/2022   10:13 AM  MMSE - Mini Mental State Exam  Orientation to time 5  Orientation to Place 5  Registration 3  Attention/ Calculation 5  Recall 3  Language- name 2 objects 2  Language- repeat 1  Language- follow 3 step command 3  Language- read & follow direction 1  Write a sentence 1  Copy design 1  Total score 30        08/30/2023    1:32 PM 06/29/2020    8:35 AM 02/04/2019   12:48 PM  6CIT Screen  What Year? 0 points 0 points 0 points  What month? 0 points 0 points 0 points  What time? 0 points 0 points 0 points  Count back from 20 0 points 0 points 0 points  Months in reverse 0 points 0 points 0 points  Repeat phrase 0 points 0 points 0 points  Total Score 0 points 0 points 0 points    Immunizations Immunization History  Administered Date(s) Administered   Influenza,inj,Quad PF,6+ Mos 09/02/2018, 09/24/2019, 11/03/2020, 11/07/2022   PPD Test 05/10/2010   Pneumococcal Polysaccharide-23 05/14/2018    Tdap 06/30/2015   Zoster Recombinant(Shingrix) 02/14/2021    TDAP status: Up to date  Flu Vaccine status: Up to date  Pneumococcal vaccine status: Up to date  Covid-19 vaccine status: Declined, Education has been provided regarding the importance of this vaccine but patient still declined. Advised may receive this vaccine at local pharmacy or Health Dept.or vaccine clinic. Aware to provide a copy of the vaccination record if obtained from local pharmacy or Health Dept. Verbalized acceptance and understanding.  Qualifies for Shingles Vaccine? Yes   Zostavax completed No   Shingrix Completed?: No.    Education has been provided regarding the importance of this vaccine. Patient has been advised to call insurance company to determine out of pocket expense if they have not yet received this vaccine. Advised may also receive vaccine at local pharmacy or Health Dept. Verbalized acceptance and understanding.  Screening Tests Health Maintenance  Topic Date Due   OPHTHALMOLOGY EXAM  Never done   Zoster Vaccines- Shingrix (2 of 2) 04/11/2021   MAMMOGRAM  11/03/2022   PAP SMEAR-Modifier  02/14/2023   FOOT EXAM  07/12/2023   INFLUENZA VACCINE  07/26/2023   HEMOGLOBIN A1C  12/04/2023   Diabetic kidney evaluation - Urine ACR  06/03/2024   Diabetic kidney evaluation - eGFR measurement  08/27/2024   Medicare Annual Wellness (AWV)  08/29/2024   DTaP/Tdap/Td (2 - Td or Tdap) 06/29/2025   Colonoscopy  02/10/2032   Hepatitis C Screening  Completed   HIV Screening  Completed   HPV VACCINES  Aged Out   COVID-19 Vaccine  Discontinued    Health Maintenance  Health Maintenance Due  Topic Date Due   OPHTHALMOLOGY EXAM  Never done   Zoster  Vaccines- Shingrix (2 of 2) 04/11/2021   MAMMOGRAM  11/03/2022   PAP SMEAR-Modifier  02/14/2023   FOOT EXAM  07/12/2023   INFLUENZA VACCINE  07/26/2023    Colorectal cancer screening: Type of screening: Colonoscopy. Completed yes. Repeat every 10  years  Mammogram status: Completed yes. Repeat every year  Lung Cancer Screening: (Low Dose CT Chest recommended if Age 51-80 years, 20 pack-year currently smoking OR have quit w/in 15years.) does qualify.   Lung Cancer Screening Referral: pt gets already  Additional Screening:  Hepatitis C Screening: does not qualify; Completed yes  Vision Screening: Recommended annual ophthalmology exams for early detection of glaucoma and other disorders of the eye. Is the patient up to date with their annual eye exam?  Yes  Who is the provider or what is the name of the office in which the patient attends annual eye exams? [facility] If pt is not established with a provider, would they like to be referred to a provider to establish care? No .   Dental Screening: Recommended annual dental exams for proper oral hygiene  Diabetic Foot Exam: Diabetic Foot Exam: Completed yes  Community Resource Referral / Chronic Care Management: CRR required this visit?  No   CCM required this visit?  No    Plan:     I have personally reviewed and noted the following in the patient's chart:   Medical and social history Use of alcohol, tobacco or illicit drugs  Current medications and supplements including opioid prescriptions. Patient is not currently taking opioid prescriptions. Functional ability and status Nutritional status Physical activity Advanced directives List of other physicians Hospitalizations, surgeries, and ER visits in previous 12 months Vitals Screenings to include cognitive, depression, and falls Referrals and appointments  In addition, I have reviewed and discussed with patient certain preventive protocols, quality metrics, and best practice recommendations. A written personalized care plan for preventive services as well as general preventive health recommendations were provided to patient.     Sue Lush, LPN   12/28/863   After Visit Summary: (Declined) Due to this  being a telephonic visit, with patients personalized plan was offered to patient but patient Declined AVS at this time   Nurse Notes: Pt presents somewhat disheveled and relays she is very stressed due to her home situation. She lives with her father, aunt and son in the fathers home. She relays she and her son is homeless.She is working with a SW to find housing. She is receiving disability of $1500 monthly and says she is having problems paying for all. Pt also relays she has a bad tooth that she is experiencing pain from. Pt given list of dental providers that take medicaid. CRR referral made for [facility]

## 2023-08-30 NOTE — Patient Instructions (Signed)
April Luna , Thank you for taking time to come for your Medicare Wellness Visit. I appreciate your ongoing commitment to your health goals. Please review the following plan we discussed and let me know if I can assist you in the future.   Referrals/Orders/Follow-Ups/Clinician Recommendations: ACO CRR  This is a list of the screening recommended for you and due dates:  Health Maintenance  Topic Date Due   Eye exam for diabetics  Never done   Zoster (Shingles) Vaccine (2 of 2) 04/11/2021   Mammogram  11/03/2022   Pap Smear  02/14/2023   Complete foot exam   07/12/2023   Flu Shot  07/26/2023   Hemoglobin A1C  12/04/2023   Yearly kidney health urinalysis for diabetes  06/03/2024   Yearly kidney function blood test for diabetes  08/27/2024   Medicare Annual Wellness Visit  08/29/2024   DTaP/Tdap/Td vaccine (2 - Td or Tdap) 06/29/2025   Colon Cancer Screening  02/10/2032   Hepatitis C Screening  Completed   HIV Screening  Completed   HPV Vaccine  Aged Out   COVID-19 Vaccine  Discontinued    Advanced directives: (Declined) Advance directive discussed with you today. Even though you declined this today, please call our office should you change your mind, and we can give you the proper paperwork for you to fill out.  Next Medicare Annual Wellness Visit scheduled for next year: Yes 09/04/2024 @ 1pm in person

## 2023-08-31 ENCOUNTER — Telehealth: Payer: Self-pay | Admitting: *Deleted

## 2023-08-31 ENCOUNTER — Other Ambulatory Visit: Payer: Self-pay

## 2023-08-31 MED FILL — Dexamethasone Sodium Phosphate Inj 100 MG/10ML: INTRAMUSCULAR | Qty: 1 | Status: AC

## 2023-08-31 NOTE — Progress Notes (Signed)
Care Coordination Note  08/31/2023 Name: April Luna MRN: [id number] DOB: 03-13-1971  April Luna is a 52 y.o. year old female who is a primary care patient of Alba Cory, MD and is actively engaged with the care management team. I reached out to [employer] by phone today to assist with re-scheduling a follow up visit with the BSW  Follow up plan: Unsuccessful telephone outreach attempt made. A HIPAA compliant phone message was left for the patient providing contact information and requesting a return call.   Burman Nieves, CCMA Care Coordination Care Guide Direct Dial: [phone]

## 2023-09-02 DIAGNOSIS — C20 Malignant neoplasm of rectum: Secondary | ICD-10-CM | POA: Diagnosis not present

## 2023-09-03 ENCOUNTER — Inpatient Hospital Stay: Payer: 59

## 2023-09-03 ENCOUNTER — Inpatient Hospital Stay (HOSPITAL_BASED_OUTPATIENT_CLINIC_OR_DEPARTMENT_OTHER): Payer: 59 | Admitting: Oncology

## 2023-09-03 ENCOUNTER — Encounter: Payer: Self-pay | Admitting: Oncology

## 2023-09-03 VITALS — BP 118/81 | HR 86 | Temp 96.4°F | Resp 18 | Wt 140.9 lb

## 2023-09-03 DIAGNOSIS — R21 Rash and other nonspecific skin eruption: Secondary | ICD-10-CM | POA: Diagnosis not present

## 2023-09-03 DIAGNOSIS — C20 Malignant neoplasm of rectum: Secondary | ICD-10-CM | POA: Diagnosis not present

## 2023-09-03 DIAGNOSIS — N200 Calculus of kidney: Secondary | ICD-10-CM | POA: Diagnosis not present

## 2023-09-03 DIAGNOSIS — K0889 Other specified disorders of teeth and supporting structures: Secondary | ICD-10-CM | POA: Diagnosis not present

## 2023-09-03 DIAGNOSIS — E1122 Type 2 diabetes mellitus with diabetic chronic kidney disease: Secondary | ICD-10-CM | POA: Diagnosis not present

## 2023-09-03 DIAGNOSIS — R197 Diarrhea, unspecified: Secondary | ICD-10-CM | POA: Diagnosis not present

## 2023-09-03 DIAGNOSIS — K521 Toxic gastroenteritis and colitis: Secondary | ICD-10-CM | POA: Diagnosis not present

## 2023-09-03 DIAGNOSIS — E039 Hypothyroidism, unspecified: Secondary | ICD-10-CM | POA: Diagnosis not present

## 2023-09-03 DIAGNOSIS — Z9221 Personal history of antineoplastic chemotherapy: Secondary | ICD-10-CM | POA: Diagnosis not present

## 2023-09-03 DIAGNOSIS — I10 Essential (primary) hypertension: Secondary | ICD-10-CM | POA: Diagnosis not present

## 2023-09-03 DIAGNOSIS — R300 Dysuria: Secondary | ICD-10-CM | POA: Diagnosis not present

## 2023-09-03 DIAGNOSIS — T451X5A Adverse effect of antineoplastic and immunosuppressive drugs, initial encounter: Secondary | ICD-10-CM

## 2023-09-03 DIAGNOSIS — K219 Gastro-esophageal reflux disease without esophagitis: Secondary | ICD-10-CM | POA: Diagnosis not present

## 2023-09-03 DIAGNOSIS — Z5111 Encounter for antineoplastic chemotherapy: Secondary | ICD-10-CM | POA: Diagnosis not present

## 2023-09-03 DIAGNOSIS — I700 Atherosclerosis of aorta: Secondary | ICD-10-CM | POA: Diagnosis not present

## 2023-09-03 DIAGNOSIS — M48061 Spinal stenosis, lumbar region without neurogenic claudication: Secondary | ICD-10-CM | POA: Diagnosis not present

## 2023-09-03 DIAGNOSIS — C787 Secondary malignant neoplasm of liver and intrahepatic bile duct: Secondary | ICD-10-CM | POA: Diagnosis not present

## 2023-09-03 DIAGNOSIS — F17210 Nicotine dependence, cigarettes, uncomplicated: Secondary | ICD-10-CM | POA: Diagnosis not present

## 2023-09-03 DIAGNOSIS — E1140 Type 2 diabetes mellitus with diabetic neuropathy, unspecified: Secondary | ICD-10-CM | POA: Diagnosis not present

## 2023-09-03 DIAGNOSIS — M47816 Spondylosis without myelopathy or radiculopathy, lumbar region: Secondary | ICD-10-CM | POA: Diagnosis not present

## 2023-09-03 DIAGNOSIS — G4730 Sleep apnea, unspecified: Secondary | ICD-10-CM | POA: Diagnosis not present

## 2023-09-03 DIAGNOSIS — G620 Drug-induced polyneuropathy: Secondary | ICD-10-CM

## 2023-09-03 DIAGNOSIS — D122 Benign neoplasm of ascending colon: Secondary | ICD-10-CM | POA: Diagnosis not present

## 2023-09-03 LAB — CBC WITH DIFFERENTIAL (CANCER CENTER ONLY)
Abs Immature Granulocytes: 0.05 10*3/uL (ref 0.00–0.07)
Basophils Absolute: 0.1 10*3/uL (ref 0.0–0.1)
Basophils Relative: 1 %
Eosinophils Absolute: 0.1 10*3/uL (ref 0.0–0.5)
Eosinophils Relative: 1 %
HCT: 40.3 % (ref 36.0–46.0)
Hemoglobin: 13.2 g/dL (ref 12.0–15.0)
Immature Granulocytes: 1 %
Lymphocytes Relative: 8 %
Lymphs Abs: 0.5 10*3/uL — ABNORMAL LOW (ref 0.7–4.0)
MCH: 32.7 pg (ref 26.0–34.0)
MCHC: 32.8 g/dL (ref 30.0–36.0)
MCV: 99.8 fL (ref 80.0–100.0)
Monocytes Absolute: 0.8 10*3/uL (ref 0.1–1.0)
Monocytes Relative: 14 %
Neutro Abs: 4.2 10*3/uL (ref 1.7–7.7)
Neutrophils Relative %: 75 %
Platelet Count: 143 10*3/uL — ABNORMAL LOW (ref 150–400)
RBC: 4.04 MIL/uL (ref 3.87–5.11)
RDW: 15.8 % — ABNORMAL HIGH (ref 11.5–15.5)
WBC Count: 5.7 10*3/uL (ref 4.0–10.5)
nRBC: 0 % (ref 0.0–0.2)

## 2023-09-03 LAB — CMP (CANCER CENTER ONLY)
ALT: 14 U/L (ref 0–44)
AST: 16 U/L (ref 15–41)
Albumin: 3.6 g/dL (ref 3.5–5.0)
Alkaline Phosphatase: 105 U/L (ref 38–126)
Anion gap: 7 (ref 5–15)
BUN: 18 mg/dL (ref 6–20)
CO2: 27 mmol/L (ref 22–32)
Calcium: 9.2 mg/dL (ref 8.9–10.3)
Chloride: 102 mmol/L (ref 98–111)
Creatinine: 1.22 mg/dL — ABNORMAL HIGH (ref 0.44–1.00)
GFR, Estimated: 54 mL/min — ABNORMAL LOW (ref >60)
Glucose, Bld: 172 mg/dL — ABNORMAL HIGH (ref 70–99)
Potassium: 4.1 mmol/L (ref 3.5–5.1)
Sodium: 136 mmol/L (ref 135–145)
Total Bilirubin: 0.4 mg/dL (ref 0.3–1.2)
Total Protein: 6.8 g/dL (ref 6.5–8.1)

## 2023-09-03 LAB — PROTEIN, URINE, RANDOM: Total Protein, Urine: 8 mg/dL

## 2023-09-03 MED ORDER — SODIUM CHLORIDE 0.9 % IV SOLN
10.0000 mg | Freq: Once | INTRAVENOUS | Status: AC
  Administered 2023-09-03: 10 mg via INTRAVENOUS
  Filled 2023-09-03: qty 10

## 2023-09-03 MED ORDER — FLUOROURACIL CHEMO INJECTION 2.5 GM/50ML
400.0000 mg/m2 | Freq: Once | INTRAVENOUS | Status: AC
  Administered 2023-09-03: 650 mg via INTRAVENOUS
  Filled 2023-09-03: qty 13

## 2023-09-03 MED ORDER — SODIUM CHLORIDE 0.9 % IV SOLN
5.0000 mg/kg | Freq: Once | INTRAVENOUS | Status: AC
  Administered 2023-09-03: 300 mg via INTRAVENOUS
  Filled 2023-09-03: qty 12

## 2023-09-03 MED ORDER — SODIUM CHLORIDE 0.9 % IV SOLN
2400.0000 mg/m2 | INTRAVENOUS | Status: DC
  Administered 2023-09-03: 3850 mg via INTRAVENOUS
  Filled 2023-09-03: qty 77

## 2023-09-03 MED ORDER — SODIUM CHLORIDE 0.9 % IV SOLN
400.0000 mg/m2 | Freq: Once | INTRAVENOUS | Status: AC
  Administered 2023-09-03: 644 mg via INTRAVENOUS
  Filled 2023-09-03: qty 32.2

## 2023-09-03 MED ORDER — ATROPINE SULFATE 1 MG/ML IV SOLN
0.5000 mg | Freq: Once | INTRAVENOUS | Status: AC | PRN
  Administered 2023-09-03: 0.5 mg via INTRAVENOUS
  Filled 2023-09-03: qty 1

## 2023-09-03 MED ORDER — PALONOSETRON HCL INJECTION 0.25 MG/5ML
0.2500 mg | Freq: Once | INTRAVENOUS | Status: AC
  Administered 2023-09-03: 0.25 mg via INTRAVENOUS
  Filled 2023-09-03: qty 5

## 2023-09-03 MED ORDER — SODIUM CHLORIDE 0.9 % IV SOLN
Freq: Once | INTRAVENOUS | Status: AC
  Filled 2023-09-03: qty 250

## 2023-09-03 MED ORDER — SODIUM CHLORIDE 0.9 % IV SOLN
180.0000 mg/m2 | Freq: Once | INTRAVENOUS | Status: AC
  Administered 2023-09-03: 300 mg via INTRAVENOUS
  Filled 2023-09-03: qty 15

## 2023-09-03 NOTE — Progress Notes (Signed)
Hematology/Oncology Progress note [phone] [phone]) [phone]      CHIEF COMPLAINTS/REASON FOR VISIT:  Follow-up for rectal cancer treatments.  ASSESSMENT & PLAN:   Cancer Staging  Rectal cancer [facility]) Staging form: Colon and Rectum, AJCC 8th Edition - Pathologic stage from 05/03/2022: Stage IIIB (pT3, pN1b, cM0) - Signed by [name], MD on 05/03/2022 - Pathologic stage from 06/18/2023: Stage IVA (rpTX, pN0, pM1a) - Signed by [name], MD on 06/18/2023   Rectal cancer [facility]) History of Stage III. pT3 pN1b cM0, s/p APR. 05/2023 Stage IV current rectal adenocarcinoma Currently on adjuvant chemotherapy. S/p FOLFOX x 4 cycles S/p concurrent chemotherapy [Xeloda 1300 mg BID] with RT, and then another 4 cycles of adjuvant FOLFOX [dose reduced oxaliplatin and omit 5-FU bolus 05/2023 CT imaging indicates disease progression. liver biopsy pathology showed metastatic carcinoma--> FOLFIRI + Bevacizumab   [employer] NGS showed TP53 missense variant, APC frameshift, TCF7L2 Frameshift, FLT3 copy number gain., TMB 4.7, pMMR Wildtype KRAS/NRAS Labs are reviewed and discussed with patient. Proceed with FOLFIRI + Bevacizumab   Encounter for antineoplastic chemotherapy Chemotherapy plan as listed above  Chemotherapy-induced neuropathy (HCC) Grade 2  Continue gabapentin 300mg  BID.   Chemotherapy induced diarrhea Recommend Imodium PRN as directed.   Skin rash Recommend Dexamethasone 8mg  daily for 2 days.  Apply topical hydrocortisone cream TID PRN  Tooth pain S.p front tooth extraction. Awaiting additional tooth extraction - oral surgeon appt    No orders of the defined types were placed in this encounter.   Follow-up 1w lab MD chemo  All questions were answered. The patient knows to call the clinic with any problems, questions or concerns.  [name], MD, PhD [facility] Hematology Oncology 09/03/2023      HISTORY OF PRESENTING ILLNESS:   April Luna is a  52 y.o.   female presents for treatment of rectal cancer Oncology History  Rectal cancer (HCC)  02/26/2022 Imaging   MRI PELVIS WITHOUT CONTRAST- By imaging, rectal cancer stage:  T1/T2, N0, Mx    03/02/2022 Imaging   CT CHEST AND ABDOMEN WITH CONTRAST 1. No convincing evidence of metastatic disease within the chest or abdomen. 2. Atrophic left kidney with multifocal renal scarring and cortical calcifications as well as nonobstructive renal stones measuring up to 5 mm. 3. Prominent left-sided predominant retroperitoneal lymph nodes measuring up to 8 mm near the level of the renal hilum, overall decreased in size dating back to CT September 19, 2018 and favored reactive related to left renal inflammation. 4.  Aortic Atherosclerosis (ICD10-I70.0).   03/27/2022 Genetic Testing    [facility] CustomNext+RNA cancer panel found no pathogenic mutations.    04/21/2022 Initial Diagnosis   Rectal cancer - baseline CEA 3.6 -02/09/2022, patient had colonoscopy which showed renal mass 10 cm from anal verge.  5 mm polyp in ascending colon.  Removed and retrieved. Pathology showed rectal adenocarcinoma.  The polyp in the ascending colon is a tubular adenoma.  MRI showed cT1/T2N0 disease  -04/21/2022, patient underwent robotic assisted ultralow anterior resection. Pathology showed moderately differentiated adenocarcinoma, 4.5 cm in maximal extent, with focal extension through muscularis propria into perirectal soft tissue.  3 lymph nodes positive for metastatic carcinoma.  Negative margin.  pT3 pN1b, MSI stable.   05/03/2022 Cancer Staging   Staging form: Colon and Rectum, AJCC 8th Edition - Pathologic stage from 05/03/2022: Stage IIIB (pT3, pN1b, cM0) - Signed by [name], MD on 05/03/2022 Stage prefix: Initial diagnosis   05/11/2022 Miscellaneous   Medi port placed  by Dr.White   05/26/2022 -  Chemotherapy   FOLFOX Q2 weeks x 4   05/26/2022 - 07/09/2022 Chemotherapy   Patient is on Treatment Plan : COLORECTAL FOLFOX q14d x 8  cycles     05/26/2022 - 12/13/2022 Chemotherapy   Patient is on Treatment Plan : COLORECTAL FOLFOX q14d x 4 months     07/27/2022 -  Chemotherapy   Concurrent chemotherapy- Xeloda  1300mg  BID and radiation.    07/27/2022 - 09/12/2022 Radiation Therapy    concurrent chemotherapy [Xeloda 1300 mg BID] with RT    09/20/2022 Imaging   CT Angiogram chest PET protocol There is no evidence of pulmonary artery embolism. There is no evidence of thoracic aortic dissection.   Small linear patchy alveolar infiltrate is seen in medial segment of right middle lobe suggesting atelectasis/pneumonia.     09/28/2022 - 09/30/2022 Hospital Admission   Admission due to acute respiratory failure with hypoxia, COPD exacerbation   09/28/2022 Imaging   CT angio chest PE protocol 1. Negative for acute PE or thoracic aortic dissection. 2. New ground-glass infiltrates anteriorly in bilateral upper lobes,may represent atypical edema, infectious or inflammatory process. 3.  Aortic Atherosclerosis     12/22/2022 Imaging   CT chest abdomen pelvis w contrast  Stable exam. No evidence of recurrent or metastatic carcinoma within the chest, abdomen, or pelvis.   Left nephrolithiasis and renal parenchymal atrophy. No evidence of ureteral calculi or hydronephrosis.   Aortic Atherosclerosis   05/31/2023 Imaging   CT chest abdomen pelvis with contrast showed 1. New hypodense 16 mm lesion in the right lobe of the liver with very subtle adjacent 5 mm lesion, suspicious for metastatic disease. 2. Small bilateral pulmonary nodules, some of which were subtly evident on prior examination only in retrospect, some of which demonstrate degree of cavitation, suspicious for metastatic disease. 3. Prominent retroperitoneal lymph nodes are similar prior. 4. Prior low anterior resection with Hartmann's pouch formation,similar small amount of perirectal/presacral soft tissue and fluid,favored postsurgical/posttreatment change. Suggest  continued attention on follow-up imaging. 5. Large volume of formed stool in the colon. 6. Nonobstructive left renal calculi measure under 1 cm.     06/08/2023 Relapse/Recurrence   Ultrasound-guided liver biopsy showed Pathology showed metastatic carcinoma, morphology consistent with patient's clinical history of rectal adenocarcinoma.  [employer] NGS 648 gene panel showed TP53 missense variant, APC frameshift, TCF7L2 frameshift, FLT3 copy number gain, TMB 4.37m/MB MSI stable.  Wildtype KRAS/NRAS  [employer] RNA Seq - ERBB3 overexpressed, VEGFA overexpressed, NRAS overexpressed.     06/18/2023 Cancer Staging   Staging form: Colon and Rectum, AJCC 8th Edition - Pathologic stage from 06/18/2023: Stage IVA (rpTX, pN0, pM1a) - Signed by [name], MD on 06/18/2023 Stage prefix: Recurrence Total positive nodes: 0   06/25/2023 - 06/25/2023 Chemotherapy   Patient is on Treatment Plan : COLORECTAL FOLFOX + Bevacizumab q14d     07/02/2023 -  Chemotherapy   Patient is on Treatment Plan : COLORECTAL FOLFIRI + Bevacizumab q14d     Patient has bipolar and schizophrenia..  Patient is married  She has housing issue due to previous history of eviction.  she does not live with her husband currently. She lives with some family members.   INTERVAL HISTORY KEGAN PIERROT is a 51 y.o. female who has above history reviewed by me today presents for follow up visit for rectal cancer.  She feels well today.  Denies any melena or blood in the stool. Stable neuropathy symptoms. Diarrhea managed by anti diarrhea.  Skin rash around her neck and scalp, manageable by topical steroid + tooth pain, per patient, dentist extracted her front lower tooth and she was referred to oral surgeon for additional extraction of molar tooth.  No infection per dentisit..     Review of Systems  Constitutional:  Positive for fatigue. Negative for appetite change, chills and fever.  HENT:   Positive for mouth sores. Negative for  hearing loss and voice change.   Eyes:  Negative for eye problems.  Respiratory:  Negative for chest tightness, cough and shortness of breath.   Cardiovascular:  Negative for chest pain.  Gastrointestinal:  Positive for diarrhea. Negative for abdominal distention, abdominal pain, blood in stool and nausea.       + colostomy  Endocrine: Negative for hot flashes.  Genitourinary:  Negative for difficulty urinating and frequency.   Musculoskeletal:  Negative for arthralgias.  Skin:  Positive for rash. Negative for itching.  Neurological:  Positive for numbness. Negative for extremity weakness.  Hematological:  Negative for adenopathy.  Psychiatric/Behavioral:  Negative for confusion.     MEDICAL HISTORY:  Past Medical History:  Diagnosis Date   Allergy    pollen   Anxiety    Arthritis    right hip   Bipolar 1 disorder (HCC)    Cancer (HCC)    rectal   Chemotherapy-induced neuropathy (HCC) 10/24/2022   Chronic kidney disease    COPD (chronic obstructive pulmonary disease) (HCC)    Depression    Family history of adverse reaction to anesthesia    grand father had a stroke during anesthesia   Family history of breast cancer    Family history of colon cancer    Family history of uterine cancer    GERD (gastroesophageal reflux disease)    History of kidney stones    Hyperlipidemia    Hypertension    Hypothyroidism    Panic attack    Pneumonia    Psoriasis    Sleep apnea 08/11/2021   No CPAP   Type 2 diabetes mellitus with microalbuminuria, without long-term current use of insulin (HCC) 06/24/2019    SURGICAL HISTORY: Past Surgical History:  Procedure Laterality Date   BREAST BIOPSY Left 12/14/2021   [place] bx, venus marker, path pending   CESAREAN SECTION     COLONOSCOPY WITH PROPOFOL N/A 02/09/2022   Procedure: COLONOSCOPY WITH PROPOFOL;  Surgeon: Toney Reil, MD;  Location: [facility];  Service: Endoscopy;  Laterality: N/A;  sleep apnea   CYSTOSCOPY W/  RETROGRADES Left 11/08/2018   Procedure: CYSTOSCOPY WITH RETROGRADE PYELOGRAM;  Surgeon: Sondra Come, MD;  Location: [facility];  Service: Urology;  Laterality: Left;   CYSTOSCOPY/URETEROSCOPY/HOLMIUM LASER/STENT PLACEMENT Left 11/08/2018   Procedure: CYSTOSCOPY/URETEROSCOPY/HOLMIUM LASER/STENT PLACEMENT;  Surgeon: Sondra Come, MD;  Location: [facility];  Service: Urology;  Laterality: Left;   IR CV LINE INJECTION  06/25/2023   IR IMAGING GUIDED PORT INSERTION  05/11/2022   IR REMOVE CV FIBRIN SHEATH  06/27/2023   MOUTH SURGERY     wisdom teeth extraction   MOUTH SURGERY     teeth removal   POLYPECTOMY N/A 02/09/2022   Procedure: POLYPECTOMY;  Surgeon: Toney Reil, MD;  Location: [facility];  Service: Endoscopy;  Laterality: N/A;   XI ROBOTIC ASSISTED LOWER ANTERIOR RESECTION N/A 04/21/2022   Procedure: XI ROBOTIC ASSISTED LOWER ANTERIOR RESECTION WITH COLOSTOMY, BILATERAL TAP BLOCK, ASSESSMENT OF TISSUE PERFUSSION WITH FIREFLY INJECTION;  Surgeon: Andria Meuse, MD;  Location: [facility];  Service: General;  Laterality: N/A;    SOCIAL HISTORY: Social History   Socioeconomic History   Marital status: Married    Spouse name: Lesle Chris   Number of children: 1   Years of education: 12   Highest education level: High school graduate  Occupational History   Occupation: unemployed    Comment: disabled  Tobacco Use   Smoking status: Some Days    Current packs/day: 0.25    Average packs/day: 0.3 packs/day for 38.3 years (9.6 ttl pk-yrs)    Types: Cigarettes    Start date: 05/13/1985    Passive exposure: Current   Smokeless tobacco: Never   Tobacco comments:    5-6 cigarettes weekly- 11/07/2022  Vaping Use   Vaping status: Former   Start date: 05/25/2018   Quit date: 09/24/2018  Substance and Sexual Activity   Alcohol use: Not Currently    Alcohol/week: 4.0 standard drinks of alcohol    Types: 4 Glasses of wine per week    Comment: quit ETOH in Feb. 2023    Drug use: Yes    Types: Marijuana    Comment: 2 days ago   Sexual activity: Not Currently    Birth control/protection: Post-menopausal  Other Topics Concern   Not on file  Social History Narrative   Lives with husband and son    Social Determinants of Health   Financial Resource Strain: High Risk (08/30/2023)   Overall Financial Resource Strain (CARDIA)    Difficulty of Paying Living Expenses: Hard  Food Insecurity: Food Insecurity Present (08/30/2023)   Hunger Vital Sign    Worried About Running Out of Food in the Last Year: Sometimes true    Ran Out of Food in the Last Year: Sometimes true  Transportation Needs: No Transportation Needs (08/30/2023)   PRAPARE - Administrator, Civil Service (Medical): No    Lack of Transportation (Non-Medical): No  Physical Activity: Inactive (08/30/2023)   Exercise Vital Sign    Days of Exercise per Week: 0 days    Minutes of Exercise per Session: 0 min  Stress: Stress Concern Present (08/30/2023)   [employer] of Occupational Health - Occupational Stress Questionnaire    Feeling of Stress : Very much  Social Connections: Moderately Integrated (08/30/2023)   Social Connection and Isolation Panel [NHANES]    Frequency of Communication with Friends and Family: Never    Frequency of Social Gatherings with Friends and Family: Once a week    Attends Religious Services: More than 4 times per year    Active Member of [employer] or Organizations: No    Attends Engineer, structural: 1 to 4 times per year    Marital Status: Married  Catering manager Violence: Not At Risk (08/30/2023)   Humiliation, Afraid, Rape, and Kick questionnaire    Fear of Current or Ex-Partner: No    Emotionally Abused: No    Physically Abused: No    Sexually Abused: No    FAMILY HISTORY: Family History  Problem Relation Age of Onset   Depression Mother    Anxiety disorder Mother    Diabetes Mother    Hypertension Mother    Hyperlipidemia Mother     Cancer Mother    Uterine cancer Mother 86   Cervical cancer Mother 79   Colon cancer Father    Depression Brother    Anxiety disorder Brother    Cancer Maternal Aunt        unk types   Diabetes Mellitus  II Maternal Grandmother    Hypercholesterolemia Maternal Grandmother    Breast cancer Maternal Grandmother    Cancer Paternal Grandmother    Diabetes Paternal Grandmother    Melanoma Paternal Grandmother    Stomach cancer Paternal Grandmother     ALLERGIES:  is allergic to metformin and related, nsaids, perphenazine, sulfa antibiotics, abilify [aripiprazole], and penicillins.  MEDICATIONS:  Current Outpatient Medications  Medication Sig Dispense Refill   ACCU-CHEK GUIDE test strip USE UP TO FOUR TIMES DAILY AS DIRECTED. 100 each 5   acetaminophen (TYLENOL) 325 MG tablet Take 650 mg by mouth every 6 (six) hours as needed for headache (pain).     albuterol (VENTOLIN HFA) 108 (90 Base) MCG/ACT inhaler Inhale 2 puffs into the lungs every 6 (six) hours as needed for wheezing or shortness of breath. 1 each 5   amantadine (SYMMETREL) 100 MG capsule Take 100 mg by mouth 2 (two) times daily.     blood glucose meter kit and supplies Dispense based on patient and insurance preference. Use up to four times daily as directed. (FOR ICD-10 E10.9, E11.9). 1 each 0   Budeson-Glycopyrrol-Formoterol (BREZTRI AEROSPHERE) 160-9-4.8 MCG/ACT AERO Inhale 2 puffs into the lungs in the morning and at bedtime. 5.9 g 11   buPROPion (WELLBUTRIN XL) 300 MG 24 hr tablet Take 300 mg by mouth daily.     Cholecalciferol (VITAMIN D-3) 125 MCG (5000 UT) TABS Take 5,000 Units by mouth daily. 30 tablet 1   clonazePAM (KLONOPIN) 0.5 MG tablet Take 0.5 mg by mouth daily.     dapagliflozin propanediol (FARXIGA) 10 MG TABS tablet Take 1 tablet (10 mg total) by mouth daily before breakfast. 90 tablet 1   dexamethasone (DECADRON) 4 MG tablet Take 2 tablets (8 mg total) by mouth See admin instructions. Take 8mg  daily for 2 days  after each chemotherapy. 30 tablet 1   diltiazem (CARDIZEM CD) 120 MG 24 hr capsule Take 1 capsule (120 mg total) by mouth daily. 90 capsule 1   docusate sodium (COLACE) 100 MG capsule TAKE 1 CAPSULE BY MOUTH TWICE DAILY 60 capsule 1   gabapentin (NEURONTIN) 300 MG capsule Take 300 mg by mouth 2 (two) times daily.     hydrocortisone cream 0.5 % Apply 1 Application topically 3 (three) times daily. 30 g 1   Hydrocortisone, Perianal, 1 % CREA Apply topically.     Insulin Pen Needle 32G X 4 MM MISC 1 each by Does not apply route daily at 12 noon. 100 each 0   ipratropium-albuterol (DUONEB) 0.5-2.5 (3) MG/3ML SOLN Take 3 mLs by nebulization every 6 (six) hours as needed. 360 mL 11   levothyroxine (SYNTHROID) 75 MCG tablet TAKE 1 TABLET BY MOUTH DAILY BEFORE BREAKFAST 90 tablet 2   lidocaine-prilocaine (EMLA) cream Apply to affected area once 30 g 3   loperamide (IMODIUM) 2 MG capsule Take 1 capsule (2 mg total) by mouth See admin instructions. Initial: 4 mg,the 2 mg every 2 hours (4 mg every 4 hours at night)  maximum: 16 mg/day 60 capsule 2   loratadine (CLARITIN) 10 MG tablet Take 10 mg by mouth every morning.     losartan (COZAAR) 25 MG tablet TAKE 1 TABLET BY MOUTH DAILY 30 tablet 3   magic mouthwash w/lidocaine SOLN Take 5 mLs by mouth 4 (four) times daily as needed for mouth pain. Sig: Swish/Swallow 5-10 ml four times a day as needed. Dispense 480 ml. 1RF 480 mL 1   NON FORMULARY Pt uses a cpap  nightly     nystatin cream (MYCOSTATIN) Apply 1 Application topically 2 (two) times daily. 30 g 0   ondansetron (ZOFRAN) 8 MG tablet Take 1 tablet (8 mg total) by mouth every 8 (eight) hours as needed for nausea, vomiting or refractory nausea / vomiting. Start on the third day after chemotherapy. 30 tablet 1   oxybutynin (DITROPAN-XL) 10 MG 24 hr tablet Take 1 tablet (10 mg total) by mouth daily. 30 tablet 11   paliperidone (INVEGA SUSTENNA) 234 MG/1.5ML SUSY injection Inject 234 mg into the muscle every 30  (thirty) days. On or about the 14th of each month     pantoprazole (PROTONIX) 40 MG tablet TAKE 1 TABLET BY MOUTH DAILY 90 tablet 2   Pediatric Multivit-Minerals-C (CHEWABLES MULTIVITAMIN PO) Take 1 tablet by mouth daily.     pioglitazone (ACTOS) 15 MG tablet Take 1 tablet (15 mg total) by mouth daily. 90 tablet 1   promethazine (PHENERGAN) 12.5 MG tablet Take 1 tablet (12.5 mg total) by mouth every 6 (six) hours as needed for nausea or vomiting. 30 tablet 0   rosuvastatin (CRESTOR) 40 MG tablet TAKE 1 TABLET BY MOUTH EVERY MORNING 30 tablet 3   sertraline (ZOLOFT) 100 MG tablet Take 200 mg by mouth every morning.     Spacer/Aero-Holding Chambers (AEROCHAMBER MV) inhaler Use as instructed 1 each 0   VASCEPA 1 g capsule TAKE 2 CAPSULES BY MOUTH 2 TIMES DAILY 120 capsule 3   No current facility-administered medications for this visit.   Facility-Administered Medications Ordered in Other Visits  Medication Dose Route Frequency Provider Last Rate Last Admin   albuterol (PROVENTIL) (2.5 MG/3ML) 0.083% nebulizer solution 2.5 mg  2.5 mg Nebulization Once Raechel Chute, MD       fluorouracil (ADRUCIL) 3,850 mg in sodium chloride 0.9 % 73 mL chemo infusion  2,400 mg/m2 (Treatment Plan Recorded) Intravenous 1 day or 1 dose [name], MD       fluorouracil (ADRUCIL) chemo injection 650 mg  400 mg/m2 (Treatment Plan Recorded) Intravenous Once [name], MD       irinotecan (CAMPTOSAR) 300 mg in sodium chloride 0.9 % 500 mL chemo infusion  180 mg/m2 (Treatment Plan Recorded) Intravenous Once [name], MD 343 mL/hr at 09/03/23 1142 300 mg at 09/03/23 1142   leucovorin 644 mg in sodium chloride 0.9 % 250 mL infusion  400 mg/m2 (Treatment Plan Recorded) Intravenous Once [name], MD 188 mL/hr at 09/03/23 1141 644 mg at 09/03/23 1141   sodium chloride flush (NS) 0.9 % injection 10 mL  10 mL Intracatheter PRN [name], MD   10 mL at 08/15/23 1327     PHYSICAL EXAMINATION: ECOG PERFORMANCE STATUS: 0 -  Asymptomatic Vitals:   09/03/23 0958  BP: 118/81  Pulse: 86  Resp: 18  Temp: (!) 96.4 F (35.8 C)  SpO2: 97%    Filed Weights   09/03/23 0958  Weight: 140 lb 14.4 oz (63.9 kg)     Physical Exam Constitutional:      General: She is not in acute distress. HENT:     Head: Normocephalic and atraumatic.     Mouth/Throat:     Comments: mucositis Eyes:     General: No scleral icterus. Cardiovascular:     Rate and Rhythm: Normal rate and regular rhythm.  Pulmonary:     Effort: Pulmonary effort is normal. No respiratory distress.     Breath sounds: No wheezing.     Comments: Decreased breath sound bilaterally Abdominal:  General: Bowel sounds are normal. There is no distension.     Palpations: Abdomen is soft.     Comments: +colostomy   Musculoskeletal:        General: No deformity. Normal range of motion.     Cervical back: Normal range of motion and neck supple.  Skin:    General: Skin is warm and dry.     Findings: No erythema or rash.  Neurological:     Mental Status: She is alert and oriented to person, place, and time. Mental status is at baseline.     Cranial Nerves: No cranial nerve deficit.     Coordination: Coordination normal.  Psychiatric:        Mood and Affect: Mood normal.     LABORATORY DATA:  I have reviewed the data as listed    Latest Ref Rng & Units 09/03/2023    9:18 AM 08/28/2023    8:37 AM 08/13/2023    8:25 AM  CBC  WBC 4.0 - 10.5 K/uL 5.7  3.5  5.7   Hemoglobin 12.0 - 15.0 g/dL 96.0  45.4  09.8   Hematocrit 36.0 - 46.0 % 40.3  40.3  42.3   Platelets 150 - 400 K/uL 143  125  107       Latest Ref Rng & Units 09/03/2023    9:18 AM 08/28/2023    8:37 AM 08/13/2023    8:25 AM  CMP  Glucose 70 - 99 mg/dL 119  147  83   BUN 6 - 20 mg/dL 18  13  19    Creatinine 0.44 - 1.00 mg/dL 8.29  5.62  1.30   Sodium 135 - 145 mmol/L 136  136  135   Potassium 3.5 - 5.1 mmol/L 4.1  4.2  4.2   Chloride 98 - 111 mmol/L 102  102  103   CO2 22 - 32 mmol/L 27   25  25    Calcium 8.9 - 10.3 mg/dL 9.2  8.7  8.8   Total Protein 6.5 - 8.1 g/dL 6.8  6.7  7.0   Total Bilirubin 0.3 - 1.2 mg/dL 0.4  0.2  0.4   Alkaline Phos 38 - 126 U/L 105  88  83   AST 15 - 41 U/L 16  16  15    ALT 0 - 44 U/L 14  13  14          RADIOGRAPHIC STUDIES: I have personally reviewed the radiological images as listed and agreed with the findings in the report. MR Lumbar Spine W Wo Contrast  Result Date: 08/19/2023 CLINICAL DATA:  Low back pain radiating into hips bilaterally, right greater than left, history of rectal cancer EXAM: MRI LUMBAR SPINE WITHOUT AND WITH CONTRAST TECHNIQUE: Multiplanar and multiecho pulse sequences of the lumbar spine were obtained without and with intravenous contrast. CONTRAST:  6mL GADAVIST GADOBUTROL 1 MMOL/ML IV SOLN COMPARISON:  No prior MRI of the lumbar spine available. Correlation is made with 05/31/2023 CT chest abdomen pelvis. FINDINGS: Segmentation:  5 lumbar type vertebral bodies. Alignment: Levocurvature of the lumbar spine. Straightening of the normal lumbar lordosis. Vertebrae: No acute fracture, evidence of discitis, or suspicious osseous lesion. No abnormal enhancement. Endplate degenerative changes eccentric to the right at L1-L2. Conus medullaris and cauda equina: Conus extends to the L1 level. Conus and cauda equina appear normal. No abnormal enhancement. Paraspinal and other soft tissues: Small cysts in the bilateral kidneys, for which no follow-up is currently indicated. As seen on the 05/31/2023  CT chest abdomen pelvis, there are prominent retroperitoneal lymph nodes, with a previously noted index left periaortic lymph node measuring 9 mm, unchanged. Disc levels: T11-T12: Seen only on the sagittal images. No significant disc bulge, spinal canal stenosis, or neural foraminal narrowing. T12-L1: No significant disc bulge. No spinal canal stenosis or neural foraminal narrowing. L1-L2: Mild disc bulge with superimposed central/right paracentral  disc protrusion. Narrowing of the right lateral recess. Mild spinal canal stenosis. No neural foraminal narrowing. L2-L3: No significant disc bulge. No spinal canal stenosis or neural foraminal narrowing. L3-L4: No significant disc bulge. Left extreme lateral annular fissure, which may irritate the adjacent left L3 nerve roots. Mild facet arthropathy. No spinal canal stenosis or neural foraminal narrowing. L4-L5: Minimal disc bulge. Mild facet arthropathy. Narrowing of the lateral recesses. No spinal canal stenosis or neural foraminal narrowing. L5-S1: No significant disc bulge. Moderate right facet arthropathy. Narrowing of the left-greater-than-right lateral recess. No spinal canal stenosis or neural foraminal narrowing. IMPRESSION: 1. L1-L2 mild spinal canal stenosis. Narrowing of the right lateral recess at this level could affect the descending right L2 nerve roots. 2. L3-L4 left extreme lateral annular fissure, which may irritate the adjacent left L3 nerve roots. 3. Narrowing of the lateral recesses at L4-L5 and L5-S1 could affect the descending L5 and S1 nerve roots, respectively. 4. No evidence of metastatic disease in the lumbar spine. Electronically Signed   By: Wiliam Ke M.D.   On: 08/19/2023 02:18

## 2023-09-03 NOTE — Assessment & Plan Note (Signed)
History of Stage III. pT3 pN1b cM0, s/p APR. 05/2023 Stage IV current rectal adenocarcinoma Currently on adjuvant chemotherapy. S/p FOLFOX x 4 cycles S/p concurrent chemotherapy [Xeloda 1300 mg BID] with RT, and then another 4 cycles of adjuvant FOLFOX [dose reduced oxaliplatin and omit 5-FU bolus 05/2023 CT imaging indicates disease progression. liver biopsy pathology showed metastatic carcinoma--> FOLFIRI + Bevacizumab   [facility] NGS showed TP53 missense variant, APC frameshift, TCF7L2 Frameshift, FLT3 copy number gain., TMB 4.7, pMMR Wildtype KRAS/NRAS Labs are reviewed and discussed with patient. Proceed with FOLFIRI + Bevacizumab

## 2023-09-03 NOTE — Assessment & Plan Note (Signed)
S.p front tooth extraction. Awaiting additional tooth extraction - oral surgeon appt

## 2023-09-03 NOTE — Assessment & Plan Note (Signed)
Chemotherapy plan as listed above 

## 2023-09-03 NOTE — Assessment & Plan Note (Signed)
Grade 2  Continue gabapentin 300mg  BID.

## 2023-09-03 NOTE — Assessment & Plan Note (Signed)
Recommend Imodium PRN as directed.

## 2023-09-03 NOTE — Patient Instructions (Signed)
[facility]  Discharge Instructions: Thank you for choosing [facility] to provide your oncology and hematology care.  If you have a lab appointment with the Cancer Center, please go directly to the Cancer Center and check in at the registration area.  Wear comfortable clothing and clothing appropriate for easy access to any Portacath or PICC line.   We strive to give you quality time with your provider. You may need to reschedule your appointment if you arrive late (15 or more minutes).  Arriving late affects you and other patients whose appointments are after yours.  Also, if you miss three or more appointments without notifying the office, you may be dismissed from the clinic at the provider's discretion.      For prescription refill requests, have your pharmacy contact our office and allow 72 hours for refills to be completed.    Today you received the following chemotherapy and/or immunotherapy agents mvasi, irinotecan, leucovorin, and adrucil      To help prevent nausea and vomiting after your treatment, we encourage you to take your nausea medication as directed.  BELOW ARE SYMPTOMS THAT SHOULD BE REPORTED IMMEDIATELY: *FEVER GREATER THAN 100.4 F (38 C) OR HIGHER *CHILLS OR SWEATING *NAUSEA AND VOMITING THAT IS NOT CONTROLLED WITH YOUR NAUSEA MEDICATION *UNUSUAL SHORTNESS OF BREATH *UNUSUAL BRUISING OR BLEEDING *URINARY PROBLEMS (pain or burning when urinating, or frequent urination) *BOWEL PROBLEMS (unusual diarrhea, constipation, pain near the anus) TENDERNESS IN MOUTH AND THROAT WITH OR WITHOUT PRESENCE OF ULCERS (sore throat, sores in mouth, or a toothache) UNUSUAL RASH, SWELLING OR PAIN  UNUSUAL VAGINAL DISCHARGE OR ITCHING   Items with * indicate a potential emergency and should be followed up as soon as possible or go to the Emergency Department if any problems should occur.  Please show the CHEMOTHERAPY ALERT CARD or  IMMUNOTHERAPY ALERT CARD at check-in to the Emergency Department and triage nurse.  Should you have questions after your visit or need to cancel or reschedule your appointment, please contact [facility]  [phone] and follow the prompts.  Office hours are 8:00 a.m. to 4:30 p.m. Monday - Friday. Please note that voicemails left after 4:00 p.m. may not be returned until the following business day.  We are closed weekends and major holidays. You have access to a nurse at all times for urgent questions. Please call the main number to the clinic [phone] and follow the prompts.  For any non-urgent questions, you may also contact your provider using MyChart. We now offer e-Visits for anyone 88 and older to request care online for non-urgent symptoms. For details visit [email or web address].   Also download the MyChart app! Go to the app store, search "MyChart", open the app, select [facility], and log in with your MyChart username and password.

## 2023-09-03 NOTE — Assessment & Plan Note (Signed)
 Recommend Dexamethasone 8mg  daily for 2 days.  Apply topical hydrocortisone cream TID PRN

## 2023-09-05 ENCOUNTER — Inpatient Hospital Stay: Payer: 59

## 2023-09-05 VITALS — BP 104/71 | HR 99 | Temp 97.0°F | Resp 18

## 2023-09-05 DIAGNOSIS — M47816 Spondylosis without myelopathy or radiculopathy, lumbar region: Secondary | ICD-10-CM | POA: Diagnosis not present

## 2023-09-05 DIAGNOSIS — M48061 Spinal stenosis, lumbar region without neurogenic claudication: Secondary | ICD-10-CM | POA: Diagnosis not present

## 2023-09-05 DIAGNOSIS — I10 Essential (primary) hypertension: Secondary | ICD-10-CM | POA: Diagnosis not present

## 2023-09-05 DIAGNOSIS — K0889 Other specified disorders of teeth and supporting structures: Secondary | ICD-10-CM | POA: Diagnosis not present

## 2023-09-05 DIAGNOSIS — C787 Secondary malignant neoplasm of liver and intrahepatic bile duct: Secondary | ICD-10-CM | POA: Diagnosis not present

## 2023-09-05 DIAGNOSIS — E1140 Type 2 diabetes mellitus with diabetic neuropathy, unspecified: Secondary | ICD-10-CM | POA: Diagnosis not present

## 2023-09-05 DIAGNOSIS — G620 Drug-induced polyneuropathy: Secondary | ICD-10-CM | POA: Diagnosis not present

## 2023-09-05 DIAGNOSIS — G4730 Sleep apnea, unspecified: Secondary | ICD-10-CM | POA: Diagnosis not present

## 2023-09-05 DIAGNOSIS — I700 Atherosclerosis of aorta: Secondary | ICD-10-CM | POA: Diagnosis not present

## 2023-09-05 DIAGNOSIS — T451X5A Adverse effect of antineoplastic and immunosuppressive drugs, initial encounter: Secondary | ICD-10-CM | POA: Diagnosis not present

## 2023-09-05 DIAGNOSIS — D122 Benign neoplasm of ascending colon: Secondary | ICD-10-CM | POA: Diagnosis not present

## 2023-09-05 DIAGNOSIS — E1122 Type 2 diabetes mellitus with diabetic chronic kidney disease: Secondary | ICD-10-CM | POA: Diagnosis not present

## 2023-09-05 DIAGNOSIS — Z9221 Personal history of antineoplastic chemotherapy: Secondary | ICD-10-CM | POA: Diagnosis not present

## 2023-09-05 DIAGNOSIS — R197 Diarrhea, unspecified: Secondary | ICD-10-CM | POA: Diagnosis not present

## 2023-09-05 DIAGNOSIS — R21 Rash and other nonspecific skin eruption: Secondary | ICD-10-CM | POA: Diagnosis not present

## 2023-09-05 DIAGNOSIS — R300 Dysuria: Secondary | ICD-10-CM | POA: Diagnosis not present

## 2023-09-05 DIAGNOSIS — N200 Calculus of kidney: Secondary | ICD-10-CM | POA: Diagnosis not present

## 2023-09-05 DIAGNOSIS — C20 Malignant neoplasm of rectum: Secondary | ICD-10-CM | POA: Diagnosis not present

## 2023-09-05 DIAGNOSIS — K219 Gastro-esophageal reflux disease without esophagitis: Secondary | ICD-10-CM | POA: Diagnosis not present

## 2023-09-05 DIAGNOSIS — F17210 Nicotine dependence, cigarettes, uncomplicated: Secondary | ICD-10-CM | POA: Diagnosis not present

## 2023-09-05 DIAGNOSIS — Z5111 Encounter for antineoplastic chemotherapy: Secondary | ICD-10-CM | POA: Diagnosis not present

## 2023-09-05 DIAGNOSIS — E039 Hypothyroidism, unspecified: Secondary | ICD-10-CM | POA: Diagnosis not present

## 2023-09-05 MED ORDER — HEPARIN SOD (PORK) LOCK FLUSH 100 UNIT/ML IV SOLN
500.0000 [IU] | Freq: Once | INTRAVENOUS | Status: AC | PRN
  Administered 2023-09-05: 500 [IU]
  Filled 2023-09-05: qty 5

## 2023-09-05 MED ORDER — SODIUM CHLORIDE 0.9% FLUSH
10.0000 mL | INTRAVENOUS | Status: DC | PRN
  Administered 2023-09-05: 10 mL
  Filled 2023-09-05: qty 10

## 2023-09-10 ENCOUNTER — Ambulatory Visit: Payer: Self-pay

## 2023-09-10 NOTE — Patient Outreach (Signed)
Care Coordination   09/10/2023 Name: April Luna MRN: [id number] DOB: August 03, 1971   Care Coordination Outreach Attempts:  An unsuccessful telephone outreach was attempted for a scheduled appointment today.  Follow Up Plan:  Additional outreach attempts will be made to offer the patient care coordination information and services.   Encounter Outcome:  No Answer   Care Coordination Interventions:  No, not indicated    SIG Lysle Morales, BSW Social Worker [phone]

## 2023-09-11 ENCOUNTER — Ambulatory Visit: Payer: 59

## 2023-09-11 ENCOUNTER — Ambulatory Visit: Payer: 59 | Admitting: Oncology

## 2023-09-11 ENCOUNTER — Other Ambulatory Visit: Payer: 59

## 2023-09-12 ENCOUNTER — Other Ambulatory Visit: Payer: Self-pay | Admitting: Oncology

## 2023-09-12 ENCOUNTER — Other Ambulatory Visit: Payer: Self-pay | Admitting: Family Medicine

## 2023-09-12 DIAGNOSIS — E1169 Type 2 diabetes mellitus with other specified complication: Secondary | ICD-10-CM

## 2023-09-13 ENCOUNTER — Encounter: Payer: Self-pay | Admitting: Oncology

## 2023-09-13 ENCOUNTER — Encounter: Payer: Self-pay | Admitting: Nurse Practitioner

## 2023-09-14 ENCOUNTER — Encounter: Payer: Self-pay | Admitting: Oncology

## 2023-09-14 MED FILL — Dexamethasone Sodium Phosphate Inj 100 MG/10ML: INTRAMUSCULAR | Qty: 1 | Status: AC

## 2023-09-14 NOTE — Progress Notes (Signed)
Care Coordination Note  09/14/2023 Name: April Luna MRN: [id number] DOB: 09-27-1971  April Luna is a 52 y.o. year old female who is a primary care patient of Alba Cory, MD and is actively engaged with the care management team. I reached out to [employer] by phone today to assist with re-scheduling an initial visit with the BSW  Follow up plan: We have been unable to make contact with the patient for follow up.   Burman Nieves, CCMA Care Coordination Care Guide Direct Dial: [phone]

## 2023-09-17 ENCOUNTER — Inpatient Hospital Stay (HOSPITAL_BASED_OUTPATIENT_CLINIC_OR_DEPARTMENT_OTHER): Payer: 59 | Admitting: Oncology

## 2023-09-17 ENCOUNTER — Encounter: Payer: Self-pay | Admitting: Oncology

## 2023-09-17 ENCOUNTER — Inpatient Hospital Stay: Payer: 59

## 2023-09-17 ENCOUNTER — Other Ambulatory Visit: Payer: Self-pay

## 2023-09-17 VITALS — BP 115/53 | HR 90 | Temp 96.5°F | Resp 18 | Wt 137.3 lb

## 2023-09-17 DIAGNOSIS — C20 Malignant neoplasm of rectum: Secondary | ICD-10-CM | POA: Diagnosis not present

## 2023-09-17 DIAGNOSIS — D122 Benign neoplasm of ascending colon: Secondary | ICD-10-CM | POA: Diagnosis not present

## 2023-09-17 DIAGNOSIS — G4730 Sleep apnea, unspecified: Secondary | ICD-10-CM | POA: Diagnosis not present

## 2023-09-17 DIAGNOSIS — K219 Gastro-esophageal reflux disease without esophagitis: Secondary | ICD-10-CM | POA: Diagnosis not present

## 2023-09-17 DIAGNOSIS — R300 Dysuria: Secondary | ICD-10-CM

## 2023-09-17 DIAGNOSIS — F17210 Nicotine dependence, cigarettes, uncomplicated: Secondary | ICD-10-CM | POA: Diagnosis not present

## 2023-09-17 DIAGNOSIS — Z9221 Personal history of antineoplastic chemotherapy: Secondary | ICD-10-CM | POA: Diagnosis not present

## 2023-09-17 DIAGNOSIS — C787 Secondary malignant neoplasm of liver and intrahepatic bile duct: Secondary | ICD-10-CM | POA: Diagnosis not present

## 2023-09-17 DIAGNOSIS — K0889 Other specified disorders of teeth and supporting structures: Secondary | ICD-10-CM | POA: Diagnosis not present

## 2023-09-17 DIAGNOSIS — R21 Rash and other nonspecific skin eruption: Secondary | ICD-10-CM | POA: Diagnosis not present

## 2023-09-17 DIAGNOSIS — Z5111 Encounter for antineoplastic chemotherapy: Secondary | ICD-10-CM

## 2023-09-17 DIAGNOSIS — R197 Diarrhea, unspecified: Secondary | ICD-10-CM | POA: Diagnosis not present

## 2023-09-17 DIAGNOSIS — E1122 Type 2 diabetes mellitus with diabetic chronic kidney disease: Secondary | ICD-10-CM | POA: Diagnosis not present

## 2023-09-17 DIAGNOSIS — G620 Drug-induced polyneuropathy: Secondary | ICD-10-CM | POA: Diagnosis not present

## 2023-09-17 DIAGNOSIS — I10 Essential (primary) hypertension: Secondary | ICD-10-CM | POA: Diagnosis not present

## 2023-09-17 DIAGNOSIS — M47816 Spondylosis without myelopathy or radiculopathy, lumbar region: Secondary | ICD-10-CM | POA: Diagnosis not present

## 2023-09-17 DIAGNOSIS — T451X5A Adverse effect of antineoplastic and immunosuppressive drugs, initial encounter: Secondary | ICD-10-CM | POA: Diagnosis not present

## 2023-09-17 DIAGNOSIS — M48061 Spinal stenosis, lumbar region without neurogenic claudication: Secondary | ICD-10-CM | POA: Diagnosis not present

## 2023-09-17 DIAGNOSIS — E1140 Type 2 diabetes mellitus with diabetic neuropathy, unspecified: Secondary | ICD-10-CM | POA: Diagnosis not present

## 2023-09-17 DIAGNOSIS — E039 Hypothyroidism, unspecified: Secondary | ICD-10-CM | POA: Diagnosis not present

## 2023-09-17 DIAGNOSIS — I700 Atherosclerosis of aorta: Secondary | ICD-10-CM | POA: Diagnosis not present

## 2023-09-17 DIAGNOSIS — N200 Calculus of kidney: Secondary | ICD-10-CM | POA: Diagnosis not present

## 2023-09-17 LAB — URINALYSIS, COMPLETE (UACMP) WITH MICROSCOPIC
Bilirubin Urine: NEGATIVE
Glucose, UA: >=500 mg/dL — AB
Hgb urine dipstick: NEGATIVE
Ketones, ur: NEGATIVE mg/dL
Nitrite: NEGATIVE
Protein, ur: NEGATIVE mg/dL
Specific Gravity, Urine: 1.017 (ref 1.005–1.030)
pH: 5 (ref 5.0–8.0)

## 2023-09-17 LAB — CBC WITH DIFFERENTIAL (CANCER CENTER ONLY)
Abs Immature Granulocytes: 0.02 10*3/uL (ref 0.00–0.07)
Basophils Absolute: 0 10*3/uL (ref 0.0–0.1)
Basophils Relative: 1 %
Eosinophils Absolute: 0.1 10*3/uL (ref 0.0–0.5)
Eosinophils Relative: 2 %
HCT: 39.3 % (ref 36.0–46.0)
Hemoglobin: 13.1 g/dL (ref 12.0–15.0)
Immature Granulocytes: 0 %
Lymphocytes Relative: 12 %
Lymphs Abs: 0.6 10*3/uL — ABNORMAL LOW (ref 0.7–4.0)
MCH: 33.5 pg (ref 26.0–34.0)
MCHC: 33.3 g/dL (ref 30.0–36.0)
MCV: 100.5 fL — ABNORMAL HIGH (ref 80.0–100.0)
Monocytes Absolute: 0.6 10*3/uL (ref 0.1–1.0)
Monocytes Relative: 11 %
Neutro Abs: 3.6 10*3/uL (ref 1.7–7.7)
Neutrophils Relative %: 74 %
Platelet Count: 133 10*3/uL — ABNORMAL LOW (ref 150–400)
RBC: 3.91 MIL/uL (ref 3.87–5.11)
RDW: 17.2 % — ABNORMAL HIGH (ref 11.5–15.5)
WBC Count: 4.9 10*3/uL (ref 4.0–10.5)
nRBC: 0 % (ref 0.0–0.2)

## 2023-09-17 LAB — CMP (CANCER CENTER ONLY)
ALT: 15 U/L (ref 0–44)
AST: 19 U/L (ref 15–41)
Albumin: 3.8 g/dL (ref 3.5–5.0)
Alkaline Phosphatase: 96 U/L (ref 38–126)
Anion gap: 10 (ref 5–15)
BUN: 18 mg/dL (ref 6–20)
CO2: 26 mmol/L (ref 22–32)
Calcium: 9.4 mg/dL (ref 8.9–10.3)
Chloride: 102 mmol/L (ref 98–111)
Creatinine: 1.13 mg/dL — ABNORMAL HIGH (ref 0.44–1.00)
GFR, Estimated: 59 mL/min — ABNORMAL LOW (ref >60)
Glucose, Bld: 158 mg/dL — ABNORMAL HIGH (ref 70–99)
Potassium: 4.2 mmol/L (ref 3.5–5.1)
Sodium: 138 mmol/L (ref 135–145)
Total Bilirubin: 0.4 mg/dL (ref 0.3–1.2)
Total Protein: 7 g/dL (ref 6.5–8.1)

## 2023-09-17 LAB — PROTEIN, URINE, RANDOM: Total Protein, Urine: 9 mg/dL

## 2023-09-17 MED ORDER — HEPARIN SOD (PORK) LOCK FLUSH 100 UNIT/ML IV SOLN
500.0000 [IU] | Freq: Once | INTRAVENOUS | Status: AC
  Administered 2023-09-17: 500 [IU] via INTRAVENOUS
  Filled 2023-09-17: qty 5

## 2023-09-17 NOTE — Assessment & Plan Note (Signed)
Obtain UA and urine culture

## 2023-09-17 NOTE — Assessment & Plan Note (Signed)
Grade 2  Continue gabapentin 300mg  BID.

## 2023-09-17 NOTE — Progress Notes (Signed)
Hematology/Oncology Progress note [phone] [phone]      CHIEF COMPLAINTS/REASON FOR VISIT:  Follow-up for rectal cancer treatments.  ASSESSMENT & PLAN:   Cancer Staging  Rectal cancer [facility]) Staging form: Colon and Rectum, AJCC 8th Edition - Pathologic stage from 05/03/2022: Stage IIIB (pT3, pN1b, cM0) - Signed by [name], MD on 05/03/2022 - Pathologic stage from 06/18/2023: Stage IVA (rpTX, pN0, pM1a) - Signed by [name], MD on 06/18/2023   Rectal cancer [facility]) History of Stage III. pT3 pN1b cM0, s/p APR. 05/2023 Stage IV current rectal adenocarcinoma Currently on adjuvant chemotherapy. S/p FOLFOX x 4 cycles S/p concurrent chemotherapy [Xeloda 1300 mg BID] with RT, and then another 4 cycles of adjuvant FOLFOX [dose reduced oxaliplatin and omit 5-FU bolus 05/2023 CT imaging indicates disease progression. liver biopsy pathology showed metastatic carcinoma--> FOLFIRI + Bevacizumab   [facility] NGS showed TP53 missense variant, APC frameshift, TCF7L2 Frameshift, FLT3 copy number gain., TMB 4.7, pMMR Wildtype KRAS/NRAS Labs are reviewed and discussed with patient. Hold off treatment.    Encounter for antineoplastic chemotherapy Chemotherapy plan as listed above  Chemotherapy-induced neuropathy (HCC) Grade 2  Continue gabapentin 300mg  BID.   Dysuria Obtain UA and urine culture   Orders Placed This Encounter  Procedures   Urine Culture    Standing Status:   Future    Number of Occurrences:   1    Standing Expiration Date:   09/16/2024   Protein, urine, random    Standing Status:   Future    Standing Expiration Date:   09/23/2024   CBC with Differential (Cancer Center Only)    Standing Status:   Future    Standing Expiration Date:   09/23/2024   CMP (Cancer Center only)    Standing Status:   Future    Standing Expiration Date:   09/23/2024   Urinalysis, Complete w Microscopic    Standing Status:   Future    Number of Occurrences:   1    Standing  Expiration Date:   09/16/2024    Follow-up 1w lab MD chemo  All questions were answered. The patient knows to call the clinic with any problems, questions or concerns.  [name], MD, PhD [facility] Hematology Oncology 09/17/2023      HISTORY OF PRESENTING ILLNESS:   April Luna is a  52 y.o.  female presents for treatment of rectal cancer Oncology History  Rectal cancer (HCC)  02/26/2022 Imaging   MRI PELVIS WITHOUT CONTRAST- By imaging, rectal cancer stage:  T1/T2, N0, Mx    03/02/2022 Imaging   CT CHEST AND ABDOMEN WITH CONTRAST 1. No convincing evidence of metastatic disease within the chest or abdomen. 2. Atrophic left kidney with multifocal renal scarring and cortical calcifications as well as nonobstructive renal stones measuring up to 5 mm. 3. Prominent left-sided predominant retroperitoneal lymph nodes measuring up to 8 mm near the level of the renal hilum, overall decreased in size dating back to CT September 19, 2018 and favored reactive related to left renal inflammation. 4.  Aortic Atherosclerosis (ICD10-I70.0).   03/27/2022 Genetic Testing    [facility] CustomNext+RNA cancer panel found no pathogenic mutations.    04/21/2022 Initial Diagnosis   Rectal cancer - baseline CEA 3.6 -02/09/2022, patient had colonoscopy which showed renal mass 10 cm from anal verge.  5 mm polyp in ascending colon.  Removed and retrieved. Pathology showed rectal adenocarcinoma.  The polyp in the ascending colon is a tubular adenoma.  MRI showed cT1/T2N0 disease  -  04/21/2022, patient underwent robotic assisted ultralow anterior resection. Pathology showed moderately differentiated adenocarcinoma, 4.5 cm in maximal extent, with focal extension through muscularis propria into perirectal soft tissue.  3 lymph nodes positive for metastatic carcinoma.  Negative margin.  pT3 pN1b, MSI stable.   05/03/2022 Cancer Staging   Staging form: Colon and Rectum, AJCC 8th Edition - Pathologic stage from  05/03/2022: Stage IIIB (pT3, pN1b, cM0) - Signed by [name], MD on 05/03/2022 Stage prefix: Initial diagnosis   05/11/2022 Miscellaneous   Medi port placed by Dr.White   05/26/2022 -  Chemotherapy   FOLFOX Q2 weeks x 4   05/26/2022 - 07/09/2022 Chemotherapy   Patient is on Treatment Plan : COLORECTAL FOLFOX q14d x 8 cycles     05/26/2022 - 12/13/2022 Chemotherapy   Patient is on Treatment Plan : COLORECTAL FOLFOX q14d x 4 months     07/27/2022 -  Chemotherapy   Concurrent chemotherapy- Xeloda  1300mg  BID and radiation.    07/27/2022 - 09/12/2022 Radiation Therapy    concurrent chemotherapy [Xeloda 1300 mg BID] with RT    09/20/2022 Imaging   CT Angiogram chest PET protocol There is no evidence of pulmonary artery embolism. There is no evidence of thoracic aortic dissection.   Small linear patchy alveolar infiltrate is seen in medial segment of right middle lobe suggesting atelectasis/pneumonia.     09/28/2022 - 09/30/2022 Hospital Admission   Admission due to acute respiratory failure with hypoxia, COPD exacerbation   09/28/2022 Imaging   CT angio chest PE protocol 1. Negative for acute PE or thoracic aortic dissection. 2. New ground-glass infiltrates anteriorly in bilateral upper lobes,may represent atypical edema, infectious or inflammatory process. 3.  Aortic Atherosclerosis     12/22/2022 Imaging   CT chest abdomen pelvis w contrast  Stable exam. No evidence of recurrent or metastatic carcinoma within the chest, abdomen, or pelvis.   Left nephrolithiasis and renal parenchymal atrophy. No evidence of ureteral calculi or hydronephrosis.   Aortic Atherosclerosis   05/31/2023 Imaging   CT chest abdomen pelvis with contrast showed 1. New hypodense 16 mm lesion in the right lobe of the liver with very subtle adjacent 5 mm lesion, suspicious for metastatic disease. 2. Small bilateral pulmonary nodules, some of which were subtly evident on prior examination only in retrospect, some of  which demonstrate degree of cavitation, suspicious for metastatic disease. 3. Prominent retroperitoneal lymph nodes are similar prior. 4. Prior low anterior resection with Hartmann's pouch formation,similar small amount of perirectal/presacral soft tissue and fluid,favored postsurgical/posttreatment change. Suggest continued attention on follow-up imaging. 5. Large volume of formed stool in the colon. 6. Nonobstructive left renal calculi measure under 1 cm.     06/08/2023 Relapse/Recurrence   Ultrasound-guided liver biopsy showed Pathology showed metastatic carcinoma, morphology consistent with patient's clinical history of rectal adenocarcinoma.  [facility] NGS 648 gene panel showed TP53 missense variant, APC frameshift, TCF7L2 frameshift, FLT3 copy number gain, TMB 4.30m/MB MSI stable.  Wildtype KRAS/NRAS  [facility] RNA Seq - ERBB3 overexpressed, VEGFA overexpressed, NRAS overexpressed.     06/18/2023 Cancer Staging   Staging form: Colon and Rectum, AJCC 8th Edition - Pathologic stage from 06/18/2023: Stage IVA (rpTX, pN0, pM1a) - Signed by [name], MD on 06/18/2023 Stage prefix: Recurrence Total positive nodes: 0   06/25/2023 - 06/25/2023 Chemotherapy   Patient is on Treatment Plan : COLORECTAL FOLFOX + Bevacizumab q14d     07/02/2023 -  Chemotherapy   Patient is on Treatment Plan : COLORECTAL FOLFIRI + Bevacizumab  q14d     Patient has bipolar and schizophrenia..  Patient is married  She has housing issue due to previous history of eviction.  she does not live with her husband currently. She lives with some family members.   INTERVAL HISTORY ANNTOINETTE INZUNZA is a 52 y.o. female who has above history reviewed by me today presents for follow up visit for rectal cancer.  She feels well today.  Denies any melena or blood in the stool. Stable neuropathy symptoms. Diarrhea managed by anti diarrhea medication Skin rash around her neck and scalp, manageable by topical steroid + burning of  private area, dysuria, focal skin sore for a few days, then resolved. She wears diaper due to incontinence.     Review of Systems  Constitutional:  Positive for fatigue. Negative for appetite change, chills and fever.  HENT:   Positive for mouth sores. Negative for hearing loss and voice change.   Eyes:  Negative for eye problems.  Respiratory:  Negative for chest tightness, cough and shortness of breath.   Cardiovascular:  Negative for chest pain.  Gastrointestinal:  Positive for diarrhea. Negative for abdominal distention, abdominal pain, blood in stool and nausea.       + colostomy  Endocrine: Negative for hot flashes.  Genitourinary:  Positive for dysuria. Negative for difficulty urinating and frequency.   Musculoskeletal:  Negative for arthralgias.  Skin:  Positive for rash. Negative for itching.  Neurological:  Positive for numbness. Negative for extremity weakness.  Hematological:  Negative for adenopathy.  Psychiatric/Behavioral:  Negative for confusion.     MEDICAL HISTORY:  Past Medical History:  Diagnosis Date   Allergy    pollen   Anxiety    Arthritis    right hip   Bipolar 1 disorder (HCC)    Cancer (HCC)    rectal   Chemotherapy-induced neuropathy (HCC) 10/24/2022   Chronic kidney disease    COPD (chronic obstructive pulmonary disease) (HCC)    Depression    Family history of adverse reaction to anesthesia    grand father had a stroke during anesthesia   Family history of breast cancer    Family history of colon cancer    Family history of uterine cancer    GERD (gastroesophageal reflux disease)    History of kidney stones    Hyperlipidemia    Hypertension    Hypothyroidism    Panic attack    Pneumonia    Psoriasis    Sleep apnea 08/11/2021   No CPAP   Type 2 diabetes mellitus with microalbuminuria, without long-term current use of insulin (HCC) 06/24/2019    SURGICAL HISTORY: Past Surgical History:  Procedure Laterality Date   BREAST BIOPSY Left  12/14/2021   [place] bx, venus marker, path pending   CESAREAN SECTION     COLONOSCOPY WITH PROPOFOL N/A 02/09/2022   Procedure: COLONOSCOPY WITH PROPOFOL;  Surgeon: Toney Reil, MD;  Location: [facility];  Service: Endoscopy;  Laterality: N/A;  sleep apnea   CYSTOSCOPY W/ RETROGRADES Left 11/08/2018   Procedure: CYSTOSCOPY WITH RETROGRADE PYELOGRAM;  Surgeon: Sondra Come, MD;  Location: [facility];  Service: Urology;  Laterality: Left;   CYSTOSCOPY/URETEROSCOPY/HOLMIUM LASER/STENT PLACEMENT Left 11/08/2018   Procedure: CYSTOSCOPY/URETEROSCOPY/HOLMIUM LASER/STENT PLACEMENT;  Surgeon: Sondra Come, MD;  Location: [facility];  Service: Urology;  Laterality: Left;   IR CV LINE INJECTION  06/25/2023   IR IMAGING GUIDED PORT INSERTION  05/11/2022   IR REMOVE CV FIBRIN SHEATH  06/27/2023  MOUTH SURGERY     wisdom teeth extraction   MOUTH SURGERY     teeth removal   POLYPECTOMY N/A 02/09/2022   Procedure: POLYPECTOMY;  Surgeon: Toney Reil, MD;  Location: [facility];  Service: Endoscopy;  Laterality: N/A;   XI ROBOTIC ASSISTED LOWER ANTERIOR RESECTION N/A 04/21/2022   Procedure: XI ROBOTIC ASSISTED LOWER ANTERIOR RESECTION WITH COLOSTOMY, BILATERAL TAP BLOCK, ASSESSMENT OF TISSUE PERFUSSION WITH FIREFLY INJECTION;  Surgeon: Andria Meuse, MD;  Location: [facility];  Service: General;  Laterality: N/A;    SOCIAL HISTORY: Social History   Socioeconomic History   Marital status: Married    Spouse name: Lesle Chris   Number of children: 1   Years of education: 12   Highest education level: High school graduate  Occupational History   Occupation: unemployed    Comment: disabled  Tobacco Use   Smoking status: Some Days    Current packs/day: 0.25    Average packs/day: 0.3 packs/day for 38.3 years (9.6 ttl pk-yrs)    Types: Cigarettes    Start date: 05/13/1985    Passive exposure: Current   Smokeless tobacco: Never   Tobacco comments:    5-6 cigarettes  weekly- 11/07/2022  Vaping Use   Vaping status: Former   Start date: 05/25/2018   Quit date: 09/24/2018  Substance and Sexual Activity   Alcohol use: Not Currently    Alcohol/week: 4.0 standard drinks of alcohol    Types: 4 Glasses of wine per week    Comment: quit ETOH in Feb. 2023   Drug use: Yes    Types: Marijuana    Comment: 2 days ago   Sexual activity: Not Currently    Birth control/protection: Post-menopausal  Other Topics Concern   Not on file  Social History Narrative   Lives with husband and son    Social Determinants of Health   Financial Resource Strain: High Risk (08/30/2023)   Overall Financial Resource Strain (CARDIA)    Difficulty of Paying Living Expenses: Hard  Food Insecurity: Food Insecurity Present (08/30/2023)   Hunger Vital Sign    Worried About Running Out of Food in the Last Year: Sometimes true    Ran Out of Food in the Last Year: Sometimes true  Transportation Needs: No Transportation Needs (08/30/2023)   PRAPARE - Administrator, Civil Service (Medical): No    Lack of Transportation (Non-Medical): No  Physical Activity: Inactive (08/30/2023)   Exercise Vital Sign    Days of Exercise per Week: 0 days    Minutes of Exercise per Session: 0 min  Stress: Stress Concern Present (08/30/2023)   [employer] of Occupational Health - Occupational Stress Questionnaire    Feeling of Stress : Very much  Social Connections: Moderately Integrated (08/30/2023)   Social Connection and Isolation Panel [NHANES]    Frequency of Communication with Friends and Family: Never    Frequency of Social Gatherings with Friends and Family: Once a week    Attends Religious Services: More than 4 times per year    Active Member of [employer] or Organizations: No    Attends Banker Meetings: 1 to 4 times per year    Marital Status: Married  Catering manager Violence: Not At Risk (08/30/2023)   Humiliation, Afraid, Rape, and Kick questionnaire    Fear of Current or  Ex-Partner: No    Emotionally Abused: No    Physically Abused: No    Sexually Abused: No    FAMILY  HISTORY: Family History  Problem Relation Age of Onset   Depression Mother    Anxiety disorder Mother    Diabetes Mother    Hypertension Mother    Hyperlipidemia Mother    Cancer Mother    Uterine cancer Mother 3   Cervical cancer Mother 26   Colon cancer Father    Depression Brother    Anxiety disorder Brother    Cancer Maternal Aunt        unk types   Diabetes Mellitus II Maternal Grandmother    Hypercholesterolemia Maternal Grandmother    Breast cancer Maternal Grandmother    Cancer Paternal Grandmother    Diabetes Paternal Grandmother    Melanoma Paternal Grandmother    Stomach cancer Paternal Grandmother     ALLERGIES:  is allergic to metformin and related, nsaids, perphenazine, sulfa antibiotics, abilify [aripiprazole], and penicillins.  MEDICATIONS:  Current Outpatient Medications  Medication Sig Dispense Refill   ACCU-CHEK GUIDE test strip USE UP TO FOUR TIMES DAILY AS DIRECTED. 100 each 5   acetaminophen (TYLENOL) 325 MG tablet Take 650 mg by mouth every 6 (six) hours as needed for headache (pain).     albuterol (VENTOLIN HFA) 108 (90 Base) MCG/ACT inhaler Inhale 2 puffs into the lungs every 6 (six) hours as needed for wheezing or shortness of breath. 1 each 5   amantadine (SYMMETREL) 100 MG capsule Take 100 mg by mouth 2 (two) times daily.     blood glucose meter kit and supplies Dispense based on patient and insurance preference. Use up to four times daily as directed. (FOR ICD-10 E10.9, E11.9). 1 each 0   Budeson-Glycopyrrol-Formoterol (BREZTRI AEROSPHERE) 160-9-4.8 MCG/ACT AERO Inhale 2 puffs into the lungs in the morning and at bedtime. 5.9 g 11   buPROPion (WELLBUTRIN XL) 300 MG 24 hr tablet Take 300 mg by mouth daily.     Cholecalciferol (VITAMIN D-3) 125 MCG (5000 UT) TABS Take 5,000 Units by mouth daily. 30 tablet 1   clonazePAM (KLONOPIN) 0.5 MG tablet  Take 0.5 mg by mouth daily.     dapagliflozin propanediol (FARXIGA) 10 MG TABS tablet Take 1 tablet (10 mg total) by mouth daily before breakfast. 90 tablet 1   dexamethasone (DECADRON) 4 MG tablet Take 2 tablets (8 mg total) by mouth See admin instructions. Take 8mg  daily for 2 days after each chemotherapy. 30 tablet 1   diltiazem (CARDIZEM CD) 120 MG 24 hr capsule Take 1 capsule (120 mg total) by mouth daily. 90 capsule 1   docusate sodium (COLACE) 100 MG capsule TAKE 1 CAPSULE BY MOUTH TWICE DAILY 60 capsule 1   gabapentin (NEURONTIN) 300 MG capsule Take 300 mg by mouth 2 (two) times daily.     hydrocortisone cream 0.5 % Apply 1 Application topically 3 (three) times daily. 30 g 1   Hydrocortisone, Perianal, 1 % CREA Apply topically.     Insulin Pen Needle 32G X 4 MM MISC 1 each by Does not apply route daily at 12 noon. 100 each 0   ipratropium-albuterol (DUONEB) 0.5-2.5 (3) MG/3ML SOLN Take 3 mLs by nebulization every 6 (six) hours as needed. 360 mL 11   levothyroxine (SYNTHROID) 75 MCG tablet TAKE 1 TABLET BY MOUTH DAILY BEFORE BREAKFAST 90 tablet 2   lidocaine-prilocaine (EMLA) cream Apply to affected area once 30 g 3   loperamide (IMODIUM) 2 MG capsule Take 1 capsule (2 mg total) by mouth See admin instructions. Initial: 4 mg,the 2 mg every 2 hours (4 mg every 4  hours at night)  maximum: 16 mg/day 60 capsule 2   loratadine (CLARITIN) 10 MG tablet Take 10 mg by mouth every morning.     losartan (COZAAR) 25 MG tablet TAKE 1 TABLET BY MOUTH DAILY 30 tablet 3   magic mouthwash w/lidocaine SOLN Take 5 mLs by mouth 4 (four) times daily as needed for mouth pain. Sig: Swish/Swallow 5-10 ml four times a day as needed. Dispense 480 ml. 1RF 480 mL 1   NON FORMULARY Pt uses a cpap nightly     nystatin cream (MYCOSTATIN) Apply 1 Application topically 2 (two) times daily. 30 g 0   ondansetron (ZOFRAN) 8 MG tablet Take 1 tablet (8 mg total) by mouth every 8 (eight) hours as needed for nausea, vomiting or  refractory nausea / vomiting. Start on the third day after chemotherapy. 30 tablet 1   oxybutynin (DITROPAN-XL) 10 MG 24 hr tablet Take 1 tablet (10 mg total) by mouth daily. 30 tablet 11   paliperidone (INVEGA SUSTENNA) 234 MG/1.5ML SUSY injection Inject 234 mg into the muscle every 30 (thirty) days. On or about the 14th of each month     pantoprazole (PROTONIX) 40 MG tablet TAKE 1 TABLET BY MOUTH DAILY 90 tablet 2   Pediatric Multivit-Minerals-C (CHEWABLES MULTIVITAMIN PO) Take 1 tablet by mouth daily.     pioglitazone (ACTOS) 15 MG tablet Take 1 tablet (15 mg total) by mouth daily. 90 tablet 1   promethazine (PHENERGAN) 12.5 MG tablet Take 1 tablet (12.5 mg total) by mouth every 6 (six) hours as needed for nausea or vomiting. 30 tablet 0   rosuvastatin (CRESTOR) 40 MG tablet TAKE 1 TABLET BY MOUTH EVERY MORNING 30 tablet 3   sertraline (ZOLOFT) 100 MG tablet Take 200 mg by mouth every morning.     Spacer/Aero-Holding Chambers (AEROCHAMBER MV) inhaler Use as instructed 1 each 0   VASCEPA 1 g capsule TAKE 2 CAPSULES BY MOUTH 2 TIMES DAILY 120 capsule 3   No current facility-administered medications for this visit.   Facility-Administered Medications Ordered in Other Visits  Medication Dose Route Frequency Provider Last Rate Last Admin   albuterol (PROVENTIL) (2.5 MG/3ML) 0.083% nebulizer solution 2.5 mg  2.5 mg Nebulization Once Raechel Chute, MD       sodium chloride flush (NS) 0.9 % injection 10 mL  10 mL Intracatheter PRN [name], MD   10 mL at 08/15/23 1327     PHYSICAL EXAMINATION: ECOG PERFORMANCE STATUS: 0 - Asymptomatic Vitals:   09/17/23 0943  BP: (!) 115/53  Pulse: 90  Resp: 18  Temp: (!) 96.5 F (35.8 C)  SpO2: 97%    Filed Weights   09/17/23 0943  Weight: 137 lb 4.8 oz (62.3 kg)     Physical Exam Constitutional:      General: She is not in acute distress. HENT:     Head: Normocephalic and atraumatic.     Mouth/Throat:     Comments: mucositis Eyes:      General: No scleral icterus. Cardiovascular:     Rate and Rhythm: Normal rate and regular rhythm.  Pulmonary:     Effort: Pulmonary effort is normal. No respiratory distress.     Breath sounds: No wheezing.     Comments: Decreased breath sound bilaterally Abdominal:     General: Bowel sounds are normal. There is no distension.     Palpations: Abdomen is soft.     Comments: +colostomy   Musculoskeletal:        General: No  deformity. Normal range of motion.     Cervical back: Normal range of motion and neck supple.  Skin:    General: Skin is warm and dry.     Findings: No erythema or rash.  Neurological:     Mental Status: She is alert and oriented to person, place, and time. Mental status is at baseline.     Cranial Nerves: No cranial nerve deficit.     Coordination: Coordination normal.  Psychiatric:        Mood and Affect: Mood normal.   No skin ulcer on the vulva.  LABORATORY DATA:  I have reviewed the data as listed    Latest Ref Rng & Units 09/17/2023    9:32 AM 09/03/2023    9:18 AM 08/28/2023    8:37 AM  CBC  WBC 4.0 - 10.5 K/uL 4.9  5.7  3.5   Hemoglobin 12.0 - 15.0 g/dL 16.1  09.6  04.5   Hematocrit 36.0 - 46.0 % 39.3  40.3  40.3   Platelets 150 - 400 K/uL 133  143  125       Latest Ref Rng & Units 09/17/2023    9:32 AM 09/03/2023    9:18 AM 08/28/2023    8:37 AM  CMP  Glucose 70 - 99 mg/dL 409  811  914   BUN 6 - 20 mg/dL 18  18  13    Creatinine 0.44 - 1.00 mg/dL 7.82  9.56  2.13   Sodium 135 - 145 mmol/L 138  136  136   Potassium 3.5 - 5.1 mmol/L 4.2  4.1  4.2   Chloride 98 - 111 mmol/L 102  102  102   CO2 22 - 32 mmol/L 26  27  25    Calcium 8.9 - 10.3 mg/dL 9.4  9.2  8.7   Total Protein 6.5 - 8.1 g/dL 7.0  6.8  6.7   Total Bilirubin 0.3 - 1.2 mg/dL 0.4  0.4  0.2   Alkaline Phos 38 - 126 U/L 96  105  88   AST 15 - 41 U/L 19  16  16    ALT 0 - 44 U/L 15  14  13          RADIOGRAPHIC STUDIES: I have personally reviewed the radiological images as listed and  agreed with the findings in the report. No results found.

## 2023-09-17 NOTE — Assessment & Plan Note (Signed)
Chemotherapy plan as listed above 

## 2023-09-17 NOTE — Assessment & Plan Note (Addendum)
History of Stage III. pT3 pN1b cM0, s/p APR. 05/2023 Stage IV current rectal adenocarcinoma Currently on adjuvant chemotherapy. S/p FOLFOX x 4 cycles S/p concurrent chemotherapy [Xeloda 1300 mg BID] with RT, and then another 4 cycles of adjuvant FOLFOX [dose reduced oxaliplatin and omit 5-FU bolus 05/2023 CT imaging indicates disease progression. liver biopsy pathology showed metastatic carcinoma--> FOLFIRI + Bevacizumab   [facility] NGS showed TP53 missense variant, APC frameshift, TCF7L2 Frameshift, FLT3 copy number gain., TMB 4.7, pMMR Wildtype KRAS/NRAS Labs are reviewed and discussed with patient. Hold off treatment.

## 2023-09-18 LAB — CEA: CEA: 6 ng/mL — ABNORMAL HIGH (ref 0.0–4.7)

## 2023-09-19 ENCOUNTER — Encounter: Payer: Self-pay | Admitting: Oncology

## 2023-09-19 LAB — URINE CULTURE: Culture: 80000 — AB

## 2023-09-21 MED FILL — Dexamethasone Sodium Phosphate Inj 100 MG/10ML: INTRAMUSCULAR | Qty: 1 | Status: AC

## 2023-09-22 ENCOUNTER — Other Ambulatory Visit: Payer: Self-pay | Admitting: Oncology

## 2023-09-22 DIAGNOSIS — C20 Malignant neoplasm of rectum: Secondary | ICD-10-CM

## 2023-09-22 MED ORDER — CIPROFLOXACIN HCL 250 MG PO TABS: 250.0000 mg | ORAL_TABLET | Freq: Two times a day (BID) | ORAL | 0 refills | Status: DC

## 2023-09-23 ENCOUNTER — Other Ambulatory Visit: Payer: Self-pay

## 2023-09-23 ENCOUNTER — Emergency Department
Admission: EM | Admit: 2023-09-23 | Discharge: 2023-09-23 | Disposition: A | Discharge status: Home / Self Care | Payer: 59 | Attending: Emergency Medicine | Admitting: Emergency Medicine

## 2023-09-23 DIAGNOSIS — K047 Periapical abscess without sinus: Secondary | ICD-10-CM | POA: Insufficient documentation

## 2023-09-23 DIAGNOSIS — K0889 Other specified disorders of teeth and supporting structures: Secondary | ICD-10-CM | POA: Diagnosis present

## 2023-09-23 MED ORDER — CLINDAMYCIN HCL 150 MG PO CAPS
450.0000 mg | ORAL_CAPSULE | Freq: Once | ORAL | Status: AC
  Administered 2023-09-23: 450 mg via ORAL
  Filled 2023-09-23: qty 3

## 2023-09-23 MED ORDER — CLINDAMYCIN HCL 150 MG PO CAPS: 450.0000 mg | ORAL_CAPSULE | Freq: Three times a day (TID) | ORAL | 0 refills | Status: AC

## 2023-09-23 NOTE — ED Provider Notes (Signed)
[facility] Provider Note    Event Date/Time   First MD Initiated Contact with Patient 09/23/23 1133     (approximate)   History   Dental Pain   HPI April Luna is a 52 y.o. female presenting for dental pain.  Patient reports a history of chronic dental issues that she is now with a dentist for further management.  She has had teeth removed recently but still has ongoing poor dentition.  She notes some chronic nerve pain in her mouth but more recently felt like she was starting to develop an infection.  She had 1 episode of chills but otherwise mostly just here for pain in the mouth.  No other swelling to the neck.  No difficulty breathing or difficulty swallowing.  Pain has been relatively well-controlled with Tylenol at home.     Physical Exam   Triage Vital Signs: ED Triage Vitals  Encounter Vitals Group     BP 09/23/23 1119 119/71     Systolic BP Percentile --      Diastolic BP Percentile --      Pulse Rate 09/23/23 1119 (!) 113     Resp 09/23/23 1119 19     Temp 09/23/23 1119 99.1 F (37.3 C)     Temp Source 09/23/23 1119 Oral     SpO2 09/23/23 1119 95 %     Weight 09/23/23 1135 137 lb 2 oz (62.2 kg)     Height 09/23/23 1135 4\' 11"  (1.499 m)     Head Circumference --      Peak Flow --      Pain Score 09/23/23 1118 4     Pain Loc --      Pain Education --      Exclude from Growth Chart --     Most recent vital signs: Vitals:   09/23/23 1119  BP: 119/71  Pulse: (!) 113  Resp: 19  Temp: 99.1 F (37.3 C)  SpO2: 95%   I have reviewed the vital signs. General:  Awake, alert, no acute distress. Head:  Normocephalic, Atraumatic. EENT:  PERRL, EOMI, Oral mucosa pink and moist, Neck is supple.  Poor dentition noted throughout.  No obvious palpable abscesses throughout mandibular or maxillary teeth.  Mild tenderness to palpation of right mandibular molar. Cardiovascular: Regular rate, 2+ distal pulses. Respiratory:  Normal  respiratory effort, symmetrical expansion, no distress.   Extremities:  Moving all four extremities through full ROM without pain.   Neuro:  Alert and oriented.  Interacting appropriately.   Skin:  Warm, dry, no rash.   Psych: Appropriate affect.    ED Results / Procedures / Treatments   Labs (all labs ordered are listed, but only abnormal results are displayed) Labs Reviewed - No data to display   EKG    RADIOLOGY    PROCEDURES:  Critical Care performed: No  Procedures   MEDICATIONS ORDERED IN ED: Medications  clindamycin (CLEOCIN) capsule 450 mg (has no administration in time range)     IMPRESSION / MDM / ASSESSMENT AND PLAN / ED COURSE  I reviewed the triage vital signs and the nursing notes.                              Differential diagnosis includes, but is not limited to, dental caries, dental infection, dental abscess.  Patient's presentation is most consistent with exacerbation of chronic illness.  Patient is a 52 year old female  with chronic dental caries and poor dentition presenting today for dental pain.  Evidence of infection on the right mandibular side without evidence of abscess anywhere.  No trismus.  No unilateral facial swelling.  No significant tenderness palpation throughout the neck.  Also noting some viral URI symptoms which is likely contributing to her pain symptoms.  Otherwise, well-appearing without acute pathology.  Will start on antibiotics and discharged with the same.  She already has dental follow-up planned.  Given strict return precautions.     FINAL CLINICAL IMPRESSION(S) / ED DIAGNOSES   Final diagnoses:  Dental infection     Rx / DC Orders   ED Discharge Orders          Ordered    clindamycin (CLEOCIN) 150 MG capsule  3 times daily        09/23/23 1205             Note:  This document was prepared using Dragon voice recognition software and may include unintentional dictation errors.   Janith Lima,  MD 09/23/23 [phone]

## 2023-09-23 NOTE — ED Triage Notes (Signed)
Pt comes with c/o dental pain on right side. Pt states she has exposed nerve and not sure if it is getting infected.

## 2023-09-23 NOTE — ED Notes (Signed)
Pt A&O x4, no obvious distress noted, respirations regular/unlabored. Pt verbalizes understanding of discharge instructions. Pt able to ambulate from ED independently.   

## 2023-09-23 NOTE — Discharge Instructions (Signed)
You were seen in the emergency department today for your dental infection.  Please follow-up with dentistry as planned.  Please pick up your medication from the pharmacy tomorrow and take as scheduled.  Please return for any worsening swelling symptoms or difficulty breathing or swallowing.

## 2023-09-24 ENCOUNTER — Inpatient Hospital Stay: Payer: 59

## 2023-09-24 ENCOUNTER — Telehealth: Payer: Self-pay

## 2023-09-24 ENCOUNTER — Inpatient Hospital Stay: Payer: 59 | Admitting: Oncology

## 2023-09-24 ENCOUNTER — Telehealth: Payer: Self-pay | Admitting: *Deleted

## 2023-09-24 NOTE — Telephone Encounter (Signed)
Pharmacy needing clarification of patient antibiotics prescription. She has had 2 prescription sent in one by Dr Cathie Hoops Saturday for Cipro, and then the ER doctor sent in a 30 day prescription for Clindamycin yesterday, She is asking which one she should fill for patient. Please advise.

## 2023-09-24 NOTE — Telephone Encounter (Signed)
-----   Message from [name] sent at 09/22/2023  2:49 PM EDT ----- I called patient and recommended her to start cipro 250mg  BID for 7 days. Rx sent Please postpone her chemotherapy for 1 week. I will change her chemo IS. Thanks.

## 2023-09-24 NOTE — Telephone Encounter (Signed)
Please inform pt of new appt details once scheduled.

## 2023-09-24 NOTE — Telephone Encounter (Signed)
April Luna notified per Dr Cathie Hoops that patient needs both prescription of antibiotics as one is for UTI and the other fro dental infection. She thatnked me for ettingher know

## 2023-09-27 ENCOUNTER — Encounter: Payer: Self-pay | Admitting: Oncology

## 2023-10-01 ENCOUNTER — Telehealth: Payer: Self-pay | Admitting: *Deleted

## 2023-10-01 MED FILL — Dexamethasone Sodium Phosphate Inj 100 MG/10ML: INTRAMUSCULAR | Qty: 1 | Status: AC

## 2023-10-01 NOTE — Telephone Encounter (Signed)
Pt is ok to keep tomorrow's appts per Dr. Cathie Hoops. I tried calling her but no answer "call cannot be completed as dialed"

## 2023-10-01 NOTE — Telephone Encounter (Signed)
Patient called reporting that she is still on antibiotics and has half a bottle left to take so is asking if she should come for treatment tomorrow or postpone it as she has still not gotten her dental problem taken care of yet. Please April Luna

## 2023-10-02 ENCOUNTER — Encounter: Payer: Self-pay | Admitting: Nurse Practitioner

## 2023-10-02 ENCOUNTER — Inpatient Hospital Stay: Payer: 59

## 2023-10-02 ENCOUNTER — Encounter: Payer: Self-pay | Admitting: Oncology

## 2023-10-02 ENCOUNTER — Inpatient Hospital Stay: Payer: 59 | Attending: Oncology

## 2023-10-02 ENCOUNTER — Inpatient Hospital Stay: Payer: 59 | Admitting: Oncology

## 2023-10-02 DIAGNOSIS — J9601 Acute respiratory failure with hypoxia: Secondary | ICD-10-CM | POA: Insufficient documentation

## 2023-10-02 DIAGNOSIS — N189 Chronic kidney disease, unspecified: Secondary | ICD-10-CM | POA: Insufficient documentation

## 2023-10-02 DIAGNOSIS — Z79890 Hormone replacement therapy (postmenopausal): Secondary | ICD-10-CM | POA: Insufficient documentation

## 2023-10-02 DIAGNOSIS — E785 Hyperlipidemia, unspecified: Secondary | ICD-10-CM | POA: Insufficient documentation

## 2023-10-02 DIAGNOSIS — J441 Chronic obstructive pulmonary disease with (acute) exacerbation: Secondary | ICD-10-CM | POA: Insufficient documentation

## 2023-10-02 DIAGNOSIS — I129 Hypertensive chronic kidney disease with stage 1 through stage 4 chronic kidney disease, or unspecified chronic kidney disease: Secondary | ICD-10-CM | POA: Insufficient documentation

## 2023-10-02 DIAGNOSIS — N200 Calculus of kidney: Secondary | ICD-10-CM | POA: Insufficient documentation

## 2023-10-02 DIAGNOSIS — G4730 Sleep apnea, unspecified: Secondary | ICD-10-CM | POA: Insufficient documentation

## 2023-10-02 DIAGNOSIS — E039 Hypothyroidism, unspecified: Secondary | ICD-10-CM | POA: Insufficient documentation

## 2023-10-02 DIAGNOSIS — Z7952 Long term (current) use of systemic steroids: Secondary | ICD-10-CM | POA: Insufficient documentation

## 2023-10-02 DIAGNOSIS — F17210 Nicotine dependence, cigarettes, uncomplicated: Secondary | ICD-10-CM | POA: Insufficient documentation

## 2023-10-02 DIAGNOSIS — Z803 Family history of malignant neoplasm of breast: Secondary | ICD-10-CM | POA: Insufficient documentation

## 2023-10-02 DIAGNOSIS — G620 Drug-induced polyneuropathy: Secondary | ICD-10-CM | POA: Insufficient documentation

## 2023-10-02 DIAGNOSIS — F209 Schizophrenia, unspecified: Secondary | ICD-10-CM | POA: Insufficient documentation

## 2023-10-02 DIAGNOSIS — F419 Anxiety disorder, unspecified: Secondary | ICD-10-CM | POA: Insufficient documentation

## 2023-10-02 DIAGNOSIS — D122 Benign neoplasm of ascending colon: Secondary | ICD-10-CM | POA: Insufficient documentation

## 2023-10-02 DIAGNOSIS — T451X5A Adverse effect of antineoplastic and immunosuppressive drugs, initial encounter: Secondary | ICD-10-CM | POA: Insufficient documentation

## 2023-10-02 DIAGNOSIS — F319 Bipolar disorder, unspecified: Secondary | ICD-10-CM | POA: Insufficient documentation

## 2023-10-02 DIAGNOSIS — K219 Gastro-esophageal reflux disease without esophagitis: Secondary | ICD-10-CM | POA: Insufficient documentation

## 2023-10-02 DIAGNOSIS — C20 Malignant neoplasm of rectum: Secondary | ICD-10-CM | POA: Insufficient documentation

## 2023-10-02 DIAGNOSIS — Z87442 Personal history of urinary calculi: Secondary | ICD-10-CM | POA: Insufficient documentation

## 2023-10-02 DIAGNOSIS — R197 Diarrhea, unspecified: Secondary | ICD-10-CM | POA: Insufficient documentation

## 2023-10-02 DIAGNOSIS — Z5111 Encounter for antineoplastic chemotherapy: Secondary | ICD-10-CM | POA: Insufficient documentation

## 2023-10-02 DIAGNOSIS — E1122 Type 2 diabetes mellitus with diabetic chronic kidney disease: Secondary | ICD-10-CM | POA: Insufficient documentation

## 2023-10-02 DIAGNOSIS — E1140 Type 2 diabetes mellitus with diabetic neuropathy, unspecified: Secondary | ICD-10-CM | POA: Insufficient documentation

## 2023-10-02 DIAGNOSIS — Z7984 Long term (current) use of oral hypoglycemic drugs: Secondary | ICD-10-CM | POA: Insufficient documentation

## 2023-10-02 DIAGNOSIS — Z800 Family history of malignant neoplasm of digestive organs: Secondary | ICD-10-CM | POA: Insufficient documentation

## 2023-10-02 DIAGNOSIS — I700 Atherosclerosis of aorta: Secondary | ICD-10-CM | POA: Insufficient documentation

## 2023-10-02 NOTE — Assessment & Plan Note (Deleted)
History of Stage III. pT3 pN1b cM0, s/p APR. 05/2023 Stage IV current rectal adenocarcinoma Currently on adjuvant chemotherapy. S/p FOLFOX x 4 cycles S/p concurrent chemotherapy [Xeloda 1300 mg BID] with RT, and then another 4 cycles of adjuvant FOLFOX [dose reduced oxaliplatin and omit 5-FU bolus 05/2023 CT imaging indicates disease progression. liver biopsy pathology showed metastatic carcinoma--> FOLFIRI + Bevacizumab   [facility] NGS showed TP53 missense variant, APC frameshift, TCF7L2 Frameshift, FLT3 copy number gain., TMB 4.7, pMMR Wildtype KRAS/NRAS Labs are reviewed and discussed with patient.

## 2023-10-02 NOTE — Assessment & Plan Note (Deleted)
Grade 2  Continue gabapentin 300mg  BID.

## 2023-10-03 ENCOUNTER — Other Ambulatory Visit: Payer: Self-pay | Admitting: Oncology

## 2023-10-03 ENCOUNTER — Telehealth: Payer: Self-pay | Admitting: Pulmonary Disease

## 2023-10-03 DIAGNOSIS — C20 Malignant neoplasm of rectum: Secondary | ICD-10-CM

## 2023-10-03 NOTE — Telephone Encounter (Signed)
Pt has been scheduled for treatment on 10/22. Spoke to pt to inform her of appt details and Zenaida Niece pick up. Pt states she will be seeing a dentist next week.

## 2023-10-03 NOTE — Telephone Encounter (Signed)
I do not see and order but it is a [facility] patient so I will forward to [facility]

## 2023-10-03 NOTE — Telephone Encounter (Signed)
PT needs a PFT sched before her upcoming appt. This is a very over due follow up. Please call [phone]

## 2023-10-03 NOTE — Telephone Encounter (Signed)
Synetta Fail, can you help with this. There is a PFT order in chart from 06/19/2023 that shows exam begun but does not have test attached. Was pt unable to complete test? Does a new order need to be placed?

## 2023-10-04 ENCOUNTER — Inpatient Hospital Stay: Payer: 59

## 2023-10-04 ENCOUNTER — Other Ambulatory Visit: Payer: Self-pay

## 2023-10-04 DIAGNOSIS — R0602 Shortness of breath: Secondary | ICD-10-CM

## 2023-10-05 NOTE — Telephone Encounter (Signed)
I had April Luna to place new PFT order but the problem is that we don't have any PFT appts before her office visit on 10/18/23

## 2023-10-08 NOTE — Progress Notes (Unsigned)
Name: April Luna   MRN: [id number]    DOB: June 15, 1971   Date:10/09/2023       Progress Note  Subjective  Chief Complaint  Follow up  HPI  DM:  she is currently taking Actos and Farxiga. She denies side effects. She is having polyphagia, polydipsia and polyuria. Last A1C was down from 7.4% to  6.5 % to 5.9 % and today is 6.2 %  She has associated dyslipidemia and HTN., CKI stage III a and microalbuminuria ( treated with ARB and SGL-2 agonist)  She takes statin therapy . We will stop ARB today due to low bp and dizziness   Mucopurulent Chronic Bronchitis :  She states she still has a productive cough that is not as thick and has been lighter in color.  She also had a sleep study that was positive for OSA but now she said unable to tolerate CPAP while at [facility]. She quit smoking but resumed smoking again since Nov 2023 , she is smoking 15 cigarettes daily. She is getting more sob that is worse at night. She used to see Dr. Craige Cotta and took [facility] in the past. I will refill medication and place another referral for pulmonologist evaluation    HTN: she is on losartan for her kidney protection also on cardizem since hospital discharge due to tachycardia, BP is low and she is getting dizzy. We will stop Losartan , continue cardizem . She denies chest pain, palpitation or dizziness.  Hyperlipidemia/Atherosclerosis of aorta : she is now getting medication delivered pre-packaged to her house and has been more compliant, on crestor and vascepa  We will recheck labs next visit    GERD: she has been asymptomatic on Pantoprazole given by Dr. Allegra Lai . Unchanged    Bipolar affective  disorder: she is seeing psychiatrist Dr. Mervyn Skeeters at [facility], she came in alone today ( dropped off by her friend) .   Last admission was 2016 for a psychotic episode. No history of suicide attempts. Keep follow up with psychiatrist She states feeling more down lately   Hypothyroidism: she is taking levothyroxine and denies hair  loss, no change in bowel movement, she has chronic dry skin. She states started to take levothyroxine due to lithium therapy years ago  TSH has been at goal, continue current dose    Chronic right lower back pain: going on for years, she used to see chiropractor but cannot afford it,  seems to be right on top of sacro- iliac joint, she states occasionally shoots down to her right lower leg. Taking gabapentin and Tylenol and pain is stable   Psoriasis: seen by dermatologist in the past, Dr. Orson Aloe, she is now getting otc shampoo and is doing well controlling scalp itching .Unchanged   Rectal cancer: found during screening colonoscopy, diagnosed 01/2022 , she had robotic resection April 2023 , had radiation therapy and completed chemotherapy session Dec 2023, CT showed mets to liver ( biopsy proven ) and also lung lesions, she resumed another round of chemo June 2024.    Atherosclerosis of Aorta: taking statins and fish oil, continue medication.    Malnutrition: she had lost over 10 % of her weight since Oct 2022, weight was 151 lbs it went down to 130 lbs. She is doing better now, gaining a little weight now, up to 142 lbs She takes protein shakes daily to keep her weight up   Patient Active Problem List   Diagnosis Date Noted   Dysuria 09/17/2023  Mucositis 08/13/2023   Tooth pain 08/13/2023   Skin rash 07/30/2023   Chemotherapy induced diarrhea 07/16/2023   Rectal bleeding 03/05/2023   Port-A-Cath in place 03/05/2023   Chemotherapy-induced neuropathy (HCC) 10/24/2022   Tachycardia 09/22/2022   Atherosclerosis of aorta (HCC) 07/17/2022   Stage 3a chronic kidney disease (HCC) 07/17/2022   Elevated serum creatinine 06/23/2022   Tobacco use 05/26/2022   Encounter for antineoplastic chemotherapy 05/17/2022   Goals of care, counseling/discussion 05/03/2022   Rectal cancer (HCC) 04/21/2022   Genetic testing 03/27/2022   Family history of colon cancer 03/14/2022   Family history of  uterine cancer 03/14/2022   Family history of breast cancer 03/14/2022   Polyp of ascending colon    Obstructive sleep apnea syndrome 08/11/2021   Plantar callus 10/06/2019   Acquired hallux limitus of both feet 10/06/2019   Type 2 diabetes mellitus with diabetic microalbuminuria, with long-term current use of insulin (HCC) 06/24/2019   Chronic obstructive pulmonary disease (HCC) 06/24/2019   Chronic constipation 06/24/2019   ASCUS of cervix with negative high risk HPV 09/05/2018   GERD without esophagitis 04/11/2018   Hyperlipidemia 04/11/2018   Essential hypertension 04/11/2018   Hypothyroidism 04/26/2015   Bipolar I disorder, most recent episode (or current) manic (HCC) 04/25/2015   Cannabis abuse 04/25/2015    Past Surgical History:  Procedure Laterality Date   BREAST BIOPSY Left 12/14/2021   [place] bx, venus marker, path pending   CESAREAN SECTION     COLONOSCOPY WITH PROPOFOL N/A 02/09/2022   Procedure: COLONOSCOPY WITH PROPOFOL;  Surgeon: Toney Reil, MD;  Location: [facility];  Service: Endoscopy;  Laterality: N/A;  sleep apnea   CYSTOSCOPY W/ RETROGRADES Left 11/08/2018   Procedure: CYSTOSCOPY WITH RETROGRADE PYELOGRAM;  Surgeon: Sondra Come, MD;  Location: [facility];  Service: Urology;  Laterality: Left;   CYSTOSCOPY/URETEROSCOPY/HOLMIUM LASER/STENT PLACEMENT Left 11/08/2018   Procedure: CYSTOSCOPY/URETEROSCOPY/HOLMIUM LASER/STENT PLACEMENT;  Surgeon: Sondra Come, MD;  Location: [facility];  Service: Urology;  Laterality: Left;   IR CV LINE INJECTION  06/25/2023   IR IMAGING GUIDED PORT INSERTION  05/11/2022   IR REMOVE CV FIBRIN SHEATH  06/27/2023   MOUTH SURGERY     wisdom teeth extraction   MOUTH SURGERY     teeth removal   POLYPECTOMY N/A 02/09/2022   Procedure: POLYPECTOMY;  Surgeon: Toney Reil, MD;  Location: [facility];  Service: Endoscopy;  Laterality: N/A;   XI ROBOTIC ASSISTED LOWER ANTERIOR RESECTION N/A 04/21/2022    Procedure: XI ROBOTIC ASSISTED LOWER ANTERIOR RESECTION WITH COLOSTOMY, BILATERAL TAP BLOCK, ASSESSMENT OF TISSUE PERFUSSION WITH FIREFLY INJECTION;  Surgeon: Andria Meuse, MD;  Location: [facility];  Service: General;  Laterality: N/A;    Family History  Problem Relation Age of Onset   Depression Mother    Anxiety disorder Mother    Diabetes Mother    Hypertension Mother    Hyperlipidemia Mother    Cancer Mother    Uterine cancer Mother 72   Cervical cancer Mother 58   Colon cancer Father    Depression Brother    Anxiety disorder Brother    Cancer Maternal Aunt        unk types   Diabetes Mellitus II Maternal Grandmother    Hypercholesterolemia Maternal Grandmother    Breast cancer Maternal Grandmother    Cancer Paternal Grandmother    Diabetes Paternal Grandmother    Melanoma Paternal Grandmother    Stomach cancer Paternal Grandmother     Social  History   Tobacco Use   Smoking status: Some Days    Current packs/day: 0.25    Average packs/day: 0.3 packs/day for 38.4 years (9.6 ttl pk-yrs)    Types: Cigarettes    Start date: 05/13/1985    Passive exposure: Current   Smokeless tobacco: Never   Tobacco comments:    5-6 cigarettes weekly- 11/07/2022  Substance Use Topics   Alcohol use: Not Currently    Alcohol/week: 4.0 standard drinks of alcohol    Types: 4 Glasses of wine per week    Comment: quit ETOH in Feb. 2023     Current Outpatient Medications:    ACCU-CHEK GUIDE test strip, USE UP TO FOUR TIMES DAILY AS DIRECTED., Disp: 100 each, Rfl: 5   acetaminophen (TYLENOL) 325 MG tablet, Take 650 mg by mouth every 6 (six) hours as needed for headache (pain)., Disp: , Rfl:    albuterol (VENTOLIN HFA) 108 (90 Base) MCG/ACT inhaler, Inhale 2 puffs into the lungs every 6 (six) hours as needed for wheezing or shortness of breath., Disp: 1 each, Rfl: 5   amantadine (SYMMETREL) 100 MG capsule, Take 100 mg by mouth 2 (two) times daily., Disp: , Rfl:    blood glucose meter  kit and supplies, Dispense based on patient and insurance preference. Use up to four times daily as directed. (FOR ICD-10 E10.9, E11.9)., Disp: 1 each, Rfl: 0   buPROPion (WELLBUTRIN XL) 300 MG 24 hr tablet, Take 300 mg by mouth daily., Disp: , Rfl:    Cholecalciferol (VITAMIN D-3) 125 MCG (5000 UT) TABS, Take 5,000 Units by mouth daily., Disp: 30 tablet, Rfl: 1   clonazePAM (KLONOPIN) 0.5 MG tablet, Take 0.5 mg by mouth daily., Disp: , Rfl:    dapagliflozin propanediol (FARXIGA) 10 MG TABS tablet, Take 1 tablet (10 mg total) by mouth daily before breakfast., Disp: 90 tablet, Rfl: 1   dexamethasone (DECADRON) 4 MG tablet, Take 2 tablets (8 mg total) by mouth See admin instructions. Take 8mg  daily for 2 days after each chemotherapy., Disp: 30 tablet, Rfl: 1   diltiazem (CARDIZEM CD) 120 MG 24 hr capsule, Take 1 capsule (120 mg total) by mouth daily., Disp: 90 capsule, Rfl: 1   docusate sodium (COLACE) 100 MG capsule, TAKE 1 CAPSULE BY MOUTH TWICE DAILY, Disp: 60 capsule, Rfl: 1   gabapentin (NEURONTIN) 300 MG capsule, Take 300 mg by mouth 2 (two) times daily., Disp: , Rfl:    hydrocortisone cream 0.5 %, Apply 1 Application topically 3 (three) times daily., Disp: 30 g, Rfl: 1   Hydrocortisone, Perianal, 1 % CREA, Apply topically., Disp: , Rfl:    Insulin Pen Needle 32G X 4 MM MISC, 1 each by Does not apply route daily at 12 noon., Disp: 100 each, Rfl: 0   ipratropium-albuterol (DUONEB) 0.5-2.5 (3) MG/3ML SOLN, Take 3 mLs by nebulization every 6 (six) hours as needed., Disp: 360 mL, Rfl: 11   levothyroxine (SYNTHROID) 75 MCG tablet, TAKE 1 TABLET BY MOUTH DAILY BEFORE BREAKFAST, Disp: 90 tablet, Rfl: 2   lidocaine-prilocaine (EMLA) cream, Apply to affected area once, Disp: 30 g, Rfl: 3   loperamide (IMODIUM) 2 MG capsule, Take 1 capsule (2 mg total) by mouth See admin instructions. Initial: 4 mg,the 2 mg every 2 hours (4 mg every 4 hours at night)  maximum: 16 mg/day, Disp: 60 capsule, Rfl: 2    loratadine (CLARITIN) 10 MG tablet, Take 10 mg by mouth every morning., Disp: , Rfl:    magic mouthwash  w/lidocaine SOLN, Take 5 mLs by mouth 4 (four) times daily as needed for mouth pain. Sig: Swish/Swallow 5-10 ml four times a day as needed. Dispense 480 ml. 1RF, Disp: 480 mL, Rfl: 1   NON FORMULARY, Pt uses a cpap nightly, Disp: , Rfl:    nystatin cream (MYCOSTATIN), Apply 1 Application topically 2 (two) times daily., Disp: 30 g, Rfl: 0   ondansetron (ZOFRAN) 8 MG tablet, Take 1 tablet (8 mg total) by mouth every 8 (eight) hours as needed for nausea, vomiting or refractory nausea / vomiting. Start on the third day after chemotherapy., Disp: 30 tablet, Rfl: 1   oxybutynin (DITROPAN-XL) 10 MG 24 hr tablet, Take 1 tablet (10 mg total) by mouth daily., Disp: 30 tablet, Rfl: 11   paliperidone (INVEGA SUSTENNA) 234 MG/1.5ML SUSY injection, Inject 234 mg into the muscle every 30 (thirty) days. On or about the 14th of each month, Disp: , Rfl:    pantoprazole (PROTONIX) 40 MG tablet, TAKE 1 TABLET BY MOUTH DAILY, Disp: 90 tablet, Rfl: 2   Pediatric Multivit-Minerals-C (CHEWABLES MULTIVITAMIN PO), Take 1 tablet by mouth daily., Disp: , Rfl:    promethazine (PHENERGAN) 12.5 MG tablet, Take 1 tablet (12.5 mg total) by mouth every 6 (six) hours as needed for nausea or vomiting., Disp: 30 tablet, Rfl: 0   rosuvastatin (CRESTOR) 40 MG tablet, TAKE 1 TABLET BY MOUTH EVERY MORNING, Disp: 30 tablet, Rfl: 3   sertraline (ZOLOFT) 100 MG tablet, Take 200 mg by mouth every morning., Disp: , Rfl:    Spacer/Aero-Holding Chambers (AEROCHAMBER MV) inhaler, Use as instructed, Disp: 1 each, Rfl: 0   VASCEPA 1 g capsule, TAKE 2 CAPSULES BY MOUTH 2 TIMES DAILY, Disp: 120 capsule, Rfl: 3   Budeson-Glycopyrrol-Formoterol (BREZTRI AEROSPHERE) 160-9-4.8 MCG/ACT AERO, Inhale 2 puffs into the lungs in the morning and at bedtime., Disp: 5.9 g, Rfl: 1   pioglitazone (ACTOS) 15 MG tablet, Take 1 tablet (15 mg total) by mouth daily.,  Disp: 90 tablet, Rfl: 1 No current facility-administered medications for this visit.  Facility-Administered Medications Ordered in Other Visits:    albuterol (PROVENTIL) (2.5 MG/3ML) 0.083% nebulizer solution 2.5 mg, 2.5 mg, Nebulization, Once, Dgayli, Khabib, MD   sodium chloride flush (NS) 0.9 % injection 10 mL, 10 mL, Intracatheter, PRN, [name], MD, 10 mL at 08/15/23 1327  Allergies  Allergen Reactions   Metformin And Related Nausea And Vomiting   Nsaids Other (See Comments)    Stage 3 CKD   Perphenazine Other (See Comments)    Tremors, muscle weakness, tongue swelling   Sulfa Antibiotics Other (See Comments)    GI distress    Abilify [Aripiprazole] Rash   Penicillins Rash    She has taken amoxicillin without problems    I personally reviewed active problem list, medication list, allergies, family history with the patient/caregiver today.   ROS  Ten systems reviewed and is negative except as mentioned in HPI    Objective  Vitals:   10/09/23 1400  BP: (!) 112/54  Pulse: 90  Resp: 16  Temp: 97.9 F (36.6 C)  TempSrc: Oral  SpO2: 92%  Weight: 142 lb 8 oz (64.6 kg)  Height: 4\' 11"  (1.499 m)    Body mass index is 28.78 kg/m.  Physical Exam  Constitutional: Patient appears well-developed and well-nourished. No distress.  HEENT: head atraumatic, normocephalic, pupils equal and reactive to light, neck supple Cardiovascular: Normal rate, regular rhythm and normal heart sounds.  No murmur heard. No BLE edema. Pulmonary/Chest:  bilateral rhonchi.  No respiratory distress. Abdominal: Soft.  There is no tenderness. Psychiatric: Patient has a normal mood and affect. behavior is normal. Judgment and thought content normal.   Recent Results (from the past 2160 hour(s))  CMP (Cancer Center only)     Status: Abnormal   Collection Time: 07/16/23  8:56 AM  Result Value Ref Range   Sodium 138 135 - 145 mmol/L   Potassium 4.2 3.5 - 5.1 mmol/L   Chloride 103 98 - 111 mmol/L    CO2 25 22 - 32 mmol/L   Glucose, Bld 126 (H) 70 - 99 mg/dL    Comment: Glucose reference range applies only to samples taken after fasting for at least 8 hours.   BUN 11 6 - 20 mg/dL   Creatinine 5.78 4.69 - 1.00 mg/dL   Calcium 8.8 (L) 8.9 - 10.3 mg/dL   Total Protein 6.8 6.5 - 8.1 g/dL   Albumin 3.8 3.5 - 5.0 g/dL   AST 18 15 - 41 U/L   ALT 15 0 - 44 U/L   Alkaline Phosphatase 87 38 - 126 U/L   Total Bilirubin 0.3 0.3 - 1.2 mg/dL   GFR, Estimated >62 >95 mL/min    Comment: (NOTE) Calculated using the CKD-EPI Creatinine Equation (2021)    Anion gap 10 5 - 15    Comment: Performed at [facility], [address] [place], [place] [address]  CBC with Differential (Cancer Center Only)     Status: None   Collection Time: 07/16/23  8:56 AM  Result Value Ref Range   WBC Count 5.2 4.0 - 10.5 K/uL   RBC 4.18 3.87 - 5.11 MIL/uL   Hemoglobin 13.9 12.0 - 15.0 g/dL   HCT 24.4 01.0 - 27.2 %   MCV 98.6 80.0 - 100.0 fL   MCH 33.3 26.0 - 34.0 pg   MCHC 33.7 30.0 - 36.0 g/dL   RDW 53.6 64.4 - 03.4 %   Platelet Count 151 150 - 400 K/uL    Comment: SPECIMEN CHECKED FOR CLOTS   nRBC 0.0 0.0 - 0.2 %   Neutrophils Relative % 74 %   Neutro Abs 3.9 1.7 - 7.7 K/uL   Lymphocytes Relative 13 %   Lymphs Abs 0.7 0.7 - 4.0 K/uL   Monocytes Relative 8 %   Monocytes Absolute 0.4 0.1 - 1.0 K/uL   Eosinophils Relative 4 %   Eosinophils Absolute 0.2 0.0 - 0.5 K/uL   Basophils Relative 1 %   Basophils Absolute 0.0 0.0 - 0.1 K/uL   Immature Granulocytes 0 %   Abs Immature Granulocytes 0.02 0.00 - 0.07 K/uL    Comment: Performed at [facility], [address] [place], [place] [address]  Protein, urine, random     Status: None   Collection Time: 07/16/23  8:56 AM  Result Value Ref Range   Total Protein, Urine 9 mg/dL    Comment: NO NORMAL RANGE ESTABLISHED FOR THIS TEST Performed at [facility], [address] [place], [place] [address]   CMP (Cancer Center only)      Status: Abnormal   Collection Time: 07/30/23 10:51 AM  Result Value Ref Range   Sodium 136 135 - 145 mmol/L   Potassium 4.1 3.5 - 5.1 mmol/L   Chloride 102 98 - 111 mmol/L   CO2 26 22 - 32 mmol/L   Glucose, Bld 111 (H) 70 - 99 mg/dL    Comment: Glucose reference range applies only to samples taken  after fasting for at least 8 hours.   BUN 19 6 - 20 mg/dL   Creatinine 8.84 (H) 1.66 - 1.00 mg/dL   Calcium 8.9 8.9 - 06.3 mg/dL   Total Protein 7.0 6.5 - 8.1 g/dL   Albumin 3.8 3.5 - 5.0 g/dL   AST 17 15 - 41 U/L   ALT 13 0 - 44 U/L   Alkaline Phosphatase 83 38 - 126 U/L   Total Bilirubin 0.3 0.3 - 1.2 mg/dL   GFR, Estimated >01 >60 mL/min    Comment: (NOTE) Calculated using the CKD-EPI Creatinine Equation (2021)    Anion gap 8 5 - 15    Comment: Performed at [facility], [address] [place], [place] [address]  CBC with Differential (Cancer Center Only)     Status: Abnormal   Collection Time: 07/30/23 10:51 AM  Result Value Ref Range   WBC Count 4.3 4.0 - 10.5 K/uL   RBC 4.24 3.87 - 5.11 MIL/uL   Hemoglobin 14.0 12.0 - 15.0 g/dL   HCT 35.5 73.2 - 20.2 %   MCV 96.7 80.0 - 100.0 fL   MCH 33.0 26.0 - 34.0 pg   MCHC 34.1 30.0 - 36.0 g/dL   RDW 54.2 70.6 - 23.7 %   Platelet Count 116 (L) 150 - 400 K/uL   nRBC 0.0 0.0 - 0.2 %   Neutrophils Relative % 69 %   Neutro Abs 3.0 1.7 - 7.7 K/uL   Lymphocytes Relative 17 %   Lymphs Abs 0.7 0.7 - 4.0 K/uL   Monocytes Relative 10 %   Monocytes Absolute 0.4 0.1 - 1.0 K/uL   Eosinophils Relative 3 %   Eosinophils Absolute 0.1 0.0 - 0.5 K/uL   Basophils Relative 1 %   Basophils Absolute 0.0 0.0 - 0.1 K/uL   Immature Granulocytes 0 %   Abs Immature Granulocytes 0.01 0.00 - 0.07 K/uL    Comment: Performed at [facility], [address] [place], [place] [address]  Protein, urine, random     Status: None   Collection Time: 07/30/23 10:51 AM  Result Value Ref Range   Total Protein, Urine <6 mg/dL    Comment: NO NORMAL  RANGE ESTABLISHED FOR THIS TEST Performed at [facility], [address] [place], [place] [address]   CEA     Status: Abnormal   Collection Time: 08/13/23  8:25 AM  Result Value Ref Range   CEA 6.5 (H) 0.0 - 4.7 ng/mL    Comment: (NOTE)                             Nonsmokers          <3.9                             Smokers             <5.6 [employer] Electrochemiluminescence Immunoassay (ECLIA) Values obtained with different assay methods or kits cannot be used interchangeably.  Results cannot be interpreted as absolute evidence of the presence or absence of malignant disease. Performed At: [facility] [address] [place], [place] [id number] Jolene Schimke MD IR:[id number]   CBC with Differential (Cancer Center Only)     Status: Abnormal   Collection Time: 08/13/23  8:25 AM  Result Value Ref Range   WBC Count 5.7 4.0 - 10.5 K/uL   RBC  4.29 3.87 - 5.11 MIL/uL   Hemoglobin 14.1 12.0 - 15.0 g/dL   HCT 13.2 44.0 - 10.2 %   MCV 98.6 80.0 - 100.0 fL   MCH 32.9 26.0 - 34.0 pg   MCHC 33.3 30.0 - 36.0 g/dL   RDW 72.5 36.6 - 44.0 %   Platelet Count 107 (L) 150 - 400 K/uL   nRBC 0.0 0.0 - 0.2 %   Neutrophils Relative % 73 %   Neutro Abs 4.2 1.7 - 7.7 K/uL   Lymphocytes Relative 12 %   Lymphs Abs 0.7 0.7 - 4.0 K/uL   Monocytes Relative 12 %   Monocytes Absolute 0.7 0.1 - 1.0 K/uL   Eosinophils Relative 2 %   Eosinophils Absolute 0.1 0.0 - 0.5 K/uL   Basophils Relative 1 %   Basophils Absolute 0.0 0.0 - 0.1 K/uL   Immature Granulocytes 0 %   Abs Immature Granulocytes 0.01 0.00 - 0.07 K/uL    Comment: Performed at [facility], [address] [place], [place] [address]  Protein, urine, random     Status: None   Collection Time: 08/13/23  8:25 AM  Result Value Ref Range   Total Protein, Urine <6 mg/dL    Comment: NO NORMAL RANGE ESTABLISHED FOR THIS TEST Performed at [facility], [address] [place], [place] [address]   CMP  (Cancer Center only)     Status: Abnormal   Collection Time: 08/13/23  8:25 AM  Result Value Ref Range   Sodium 135 135 - 145 mmol/L   Potassium 4.2 3.5 - 5.1 mmol/L   Chloride 103 98 - 111 mmol/L   CO2 25 22 - 32 mmol/L   Glucose, Bld 83 70 - 99 mg/dL    Comment: Glucose reference range applies only to samples taken after fasting for at least 8 hours.   BUN 19 6 - 20 mg/dL   Creatinine 8.75 (H) 6.43 - 1.00 mg/dL   Calcium 8.8 (L) 8.9 - 10.3 mg/dL   Total Protein 7.0 6.5 - 8.1 g/dL   Albumin 4.0 3.5 - 5.0 g/dL   AST 15 15 - 41 U/L   ALT 14 0 - 44 U/L   Alkaline Phosphatase 83 38 - 126 U/L   Total Bilirubin 0.4 0.3 - 1.2 mg/dL   GFR, Estimated 55 (L) >60 mL/min    Comment: (NOTE) Calculated using the CKD-EPI Creatinine Equation (2021)    Anion gap 7 5 - 15    Comment: Performed at [facility], [address] [place], [place] [address]  CEA     Status: Abnormal   Collection Time: 08/28/23  8:37 AM  Result Value Ref Range   CEA 6.7 (H) 0.0 - 4.7 ng/mL    Comment: (NOTE)                             Nonsmokers          <3.9                             Smokers             <5.6 [employer] Electrochemiluminescence Immunoassay (ECLIA) Values obtained with different assay methods or kits cannot be used interchangeably.  Results cannot be interpreted as absolute evidence of the presence or absence of malignant disease. Performed At: [facility] [address] [place], [place] [id number]  Jolene Schimke MD FU:[id number]   CMP (Cancer Center only)     Status: Abnormal   Collection Time: 08/28/23  8:37 AM  Result Value Ref Range   Sodium 136 135 - 145 mmol/L   Potassium 4.2 3.5 - 5.1 mmol/L   Chloride 102 98 - 111 mmol/L   CO2 25 22 - 32 mmol/L   Glucose, Bld 149 (H) 70 - 99 mg/dL    Comment: Glucose reference range applies only to samples taken after fasting for at least 8 hours.   BUN 13 6 - 20 mg/dL   Creatinine 0.25 (H) 4.27 - 1.00 mg/dL   Calcium 8.7  (L) 8.9 - 10.3 mg/dL   Total Protein 6.7 6.5 - 8.1 g/dL   Albumin 3.7 3.5 - 5.0 g/dL   AST 16 15 - 41 U/L   ALT 13 0 - 44 U/L   Alkaline Phosphatase 88 38 - 126 U/L   Total Bilirubin 0.2 (L) 0.3 - 1.2 mg/dL   GFR, Estimated 59 (L) >60 mL/min    Comment: (NOTE) Calculated using the CKD-EPI Creatinine Equation (2021)    Anion gap 9 5 - 15    Comment: Performed at [facility], [address] [place], [place] [address]  CBC with Differential (Cancer Center Only)     Status: Abnormal   Collection Time: 08/28/23  8:37 AM  Result Value Ref Range   WBC Count 3.5 (L) 4.0 - 10.5 K/uL   RBC 4.07 3.87 - 5.11 MIL/uL   Hemoglobin 13.5 12.0 - 15.0 g/dL   HCT 62.8 31.5 - 17.6 %   MCV 99.0 80.0 - 100.0 fL   MCH 33.2 26.0 - 34.0 pg   MCHC 33.5 30.0 - 36.0 g/dL   RDW 16.0 (H) 73.7 - 10.6 %   Platelet Count 125 (L) 150 - 400 K/uL    Comment: SPECIMEN CHECKED FOR CLOTS   nRBC 0.0 0.0 - 0.2 %   Neutrophils Relative % 67 %   Neutro Abs 2.3 1.7 - 7.7 K/uL   Lymphocytes Relative 13 %   Lymphs Abs 0.5 (L) 0.7 - 4.0 K/uL   Monocytes Relative 18 %   Monocytes Absolute 0.6 0.1 - 1.0 K/uL   Eosinophils Relative 1 %   Eosinophils Absolute 0.1 0.0 - 0.5 K/uL   Basophils Relative 1 %   Basophils Absolute 0.0 0.0 - 0.1 K/uL   Immature Granulocytes 0 %   Abs Immature Granulocytes 0.01 0.00 - 0.07 K/uL    Comment: Performed at [facility], [address] [place], [place] [address]  Protein, urine, random     Status: None   Collection Time: 08/28/23  8:37 AM  Result Value Ref Range   Total Protein, Urine 11 mg/dL    Comment: NO NORMAL RANGE ESTABLISHED FOR THIS TEST Performed at [facility], [address] [place], [place] [address]   CMP (Cancer Center only)     Status: Abnormal   Collection Time: 09/03/23  9:18 AM  Result Value Ref Range   Sodium 136 135 - 145 mmol/L   Potassium 4.1 3.5 - 5.1 mmol/L   Chloride 102 98 - 111 mmol/L   CO2 27 22 - 32 mmol/L   Glucose, Bld  172 (H) 70 - 99 mg/dL    Comment: Glucose reference range applies only to samples taken after fasting for at least 8 hours.   BUN 18 6 - 20 mg/dL   Creatinine 0.35 (H) 0.09 - 1.00 mg/dL  Calcium 9.2 8.9 - 10.3 mg/dL   Total Protein 6.8 6.5 - 8.1 g/dL   Albumin 3.6 3.5 - 5.0 g/dL   AST 16 15 - 41 U/L   ALT 14 0 - 44 U/L   Alkaline Phosphatase 105 38 - 126 U/L   Total Bilirubin 0.4 0.3 - 1.2 mg/dL   GFR, Estimated 54 (L) >60 mL/min    Comment: (NOTE) Calculated using the CKD-EPI Creatinine Equation (2021)    Anion gap 7 5 - 15    Comment: Performed at [facility], [address] [place], [place] [address]  CBC with Differential (Cancer Center Only)     Status: Abnormal   Collection Time: 09/03/23  9:18 AM  Result Value Ref Range   WBC Count 5.7 4.0 - 10.5 K/uL   RBC 4.04 3.87 - 5.11 MIL/uL   Hemoglobin 13.2 12.0 - 15.0 g/dL   HCT 52.8 41.3 - 24.4 %   MCV 99.8 80.0 - 100.0 fL   MCH 32.7 26.0 - 34.0 pg   MCHC 32.8 30.0 - 36.0 g/dL   RDW 01.0 (H) 27.2 - 53.6 %   Platelet Count 143 (L) 150 - 400 K/uL   nRBC 0.0 0.0 - 0.2 %   Neutrophils Relative % 75 %   Neutro Abs 4.2 1.7 - 7.7 K/uL   Lymphocytes Relative 8 %   Lymphs Abs 0.5 (L) 0.7 - 4.0 K/uL   Monocytes Relative 14 %   Monocytes Absolute 0.8 0.1 - 1.0 K/uL   Eosinophils Relative 1 %   Eosinophils Absolute 0.1 0.0 - 0.5 K/uL   Basophils Relative 1 %   Basophils Absolute 0.1 0.0 - 0.1 K/uL   Immature Granulocytes 1 %   Abs Immature Granulocytes 0.05 0.00 - 0.07 K/uL    Comment: Performed at [facility], [address] [place], [place] [address]  Protein, urine, random     Status: None   Collection Time: 09/03/23  9:18 AM  Result Value Ref Range   Total Protein, Urine 8 mg/dL    Comment: NO NORMAL RANGE ESTABLISHED FOR THIS TEST Performed at [facility], [address] [place], [place] [address]   CEA     Status: Abnormal   Collection Time: 09/17/23  9:32 AM  Result Value Ref Range   CEA  6.0 (H) 0.0 - 4.7 ng/mL    Comment: (NOTE)                             Nonsmokers          <3.9                             Smokers             <5.6 [employer] Electrochemiluminescence Immunoassay (ECLIA) Values obtained with different assay methods or kits cannot be used interchangeably.  Results cannot be interpreted as absolute evidence of the presence or absence of malignant disease. Performed At: [facility] [address] [place], [place] [id number] Jolene Schimke MD PP:[id number]   CMP (Cancer Center only)     Status: Abnormal   Collection Time: 09/17/23  9:32 AM  Result Value Ref Range   Sodium 138 135 - 145 mmol/L   Potassium 4.2 3.5 - 5.1 mmol/L   Chloride 102 98 - 111 mmol/L   CO2 26 22 - 32 mmol/L   Glucose,  Bld 158 (H) 70 - 99 mg/dL    Comment: Glucose reference range applies only to samples taken after fasting for at least 8 hours.   BUN 18 6 - 20 mg/dL   Creatinine 1.61 (H) 0.96 - 1.00 mg/dL   Calcium 9.4 8.9 - 04.5 mg/dL   Total Protein 7.0 6.5 - 8.1 g/dL   Albumin 3.8 3.5 - 5.0 g/dL   AST 19 15 - 41 U/L   ALT 15 0 - 44 U/L   Alkaline Phosphatase 96 38 - 126 U/L   Total Bilirubin 0.4 0.3 - 1.2 mg/dL   GFR, Estimated 59 (L) >60 mL/min    Comment: (NOTE) Calculated using the CKD-EPI Creatinine Equation (2021)    Anion gap 10 5 - 15    Comment: Performed at [facility], [address] [place], [place] [address]  CBC with Differential (Cancer Center Only)     Status: Abnormal   Collection Time: 09/17/23  9:32 AM  Result Value Ref Range   WBC Count 4.9 4.0 - 10.5 K/uL   RBC 3.91 3.87 - 5.11 MIL/uL   Hemoglobin 13.1 12.0 - 15.0 g/dL   HCT 19.1 47.8 - 29.5 %   MCV 100.5 (H) 80.0 - 100.0 fL   MCH 33.5 26.0 - 34.0 pg   MCHC 33.3 30.0 - 36.0 g/dL   RDW 62.1 (H) 30.8 - 65.7 %   Platelet Count 133 (L) 150 - 400 K/uL   nRBC 0.0 0.0 - 0.2 %   Neutrophils Relative % 74 %   Neutro Abs 3.6 1.7 - 7.7 K/uL   Lymphocytes Relative 12 %    Lymphs Abs 0.6 (L) 0.7 - 4.0 K/uL   Monocytes Relative 11 %   Monocytes Absolute 0.6 0.1 - 1.0 K/uL   Eosinophils Relative 2 %   Eosinophils Absolute 0.1 0.0 - 0.5 K/uL   Basophils Relative 1 %   Basophils Absolute 0.0 0.0 - 0.1 K/uL   Immature Granulocytes 0 %   Abs Immature Granulocytes 0.02 0.00 - 0.07 K/uL    Comment: Performed at [facility], [address] [place], [place] [address]  Protein, urine, random     Status: None   Collection Time: 09/17/23  9:32 AM  Result Value Ref Range   Total Protein, Urine 9 mg/dL    Comment: NO NORMAL RANGE ESTABLISHED FOR THIS TEST Performed at [facility], [address], [place], [place] [address]   Urine Culture     Status: Abnormal   Collection Time: 09/17/23 10:30 AM   Specimen: Urine, Clean Catch  Result Value Ref Range   Specimen Description      URINE, CLEAN CATCH Performed at [facility] [place], [address] [place], [place] [address]    Special Requests      NONE Performed at [facility], [address] [place], [place] [address]    Culture 80,000 COLONIES/mL KLEBSIELLA OXYTOCA (A)    Report Status 09/19/2023 FINAL    Organism ID, Bacteria KLEBSIELLA OXYTOCA (A)       Susceptibility   Klebsiella oxytoca - MIC*    AMPICILLIN >=32 RESISTANT Resistant     CEFEPIME <=0.12 SENSITIVE Sensitive     CEFTRIAXONE <=0.25 SENSITIVE Sensitive     CIPROFLOXACIN <=0.25 SENSITIVE Sensitive     GENTAMICIN <=1 SENSITIVE Sensitive     IMIPENEM <=0.25 SENSITIVE Sensitive     NITROFURANTOIN 64 INTERMEDIATE Intermediate     TRIMETH/SULFA <=20 SENSITIVE Sensitive  AMPICILLIN/SULBACTAM >=32 RESISTANT Resistant     PIP/TAZO 16 SENSITIVE Sensitive     * 80,000 COLONIES/mL KLEBSIELLA OXYTOCA  Urinalysis, Complete w Microscopic     Status: Abnormal   Collection Time: 09/17/23 10:30 AM  Result Value Ref Range   Color, Urine YELLOW (A) YELLOW   APPearance CLEAR (A) CLEAR   Specific Gravity, Urine 1.017 1.005 -  1.030   pH 5.0 5.0 - 8.0   Glucose, UA >=500 (A) NEGATIVE mg/dL   Hgb urine dipstick NEGATIVE NEGATIVE   Bilirubin Urine NEGATIVE NEGATIVE   Ketones, ur NEGATIVE NEGATIVE mg/dL   Protein, ur NEGATIVE NEGATIVE mg/dL   Nitrite NEGATIVE NEGATIVE   Leukocytes,Ua TRACE (A) NEGATIVE   RBC / HPF 0-5 0 - 5 RBC/hpf   WBC, UA 11-20 0 - 5 WBC/hpf   Bacteria, UA RARE (A) NONE SEEN   Squamous Epithelial / HPF 0-5 0 - 5 /HPF    Comment: Performed at [facility], [address] [place], [place] [address]  POCT HgB A1C     Status: Abnormal   Collection Time: 10/09/23  2:03 PM  Result Value Ref Range   Hemoglobin A1C 6.2 (A) 4.0 - 5.6 %   HbA1c POC (<> result, manual entry)     HbA1c, POC (prediabetic range)     HbA1c, POC (controlled diabetic range)        PHQ2/9:    10/09/2023    2:01 PM 08/30/2023    1:18 PM 06/04/2023    3:44 PM 04/11/2023   11:32 AM 12/26/2022    2:07 PM  Depression screen PHQ 2/9  Decreased Interest 0 0 0 0 1  Down, Depressed, Hopeless 0 0 0 0 1  PHQ - 2 Score 0 0 0 0 2  Altered sleeping 0  0 0 0  Tired, decreased energy 0  0 0 3  Change in appetite 0  0 0 3  Feeling bad or failure about yourself  0  0 0 0  Trouble concentrating 0  0 0 0  Moving slowly or fidgety/restless 0  0 0 0  Suicidal thoughts 0  0 0 0  PHQ-9 Score 0  0 0 8  Difficult doing work/chores   Not difficult at all Not difficult at all     phq 9 is negative   Fall Risk:    08/30/2023    1:07 PM 06/04/2023    3:44 PM 04/11/2023   11:32 AM 12/26/2022    2:06 PM 11/10/2022    2:52 PM  Fall Risk   Falls in the past year? 0 0 0 0 0  Number falls in past yr: 0 0 0 0   Injury with Fall? 0 0 0 0   Risk for fall due to : No Fall Risks   No Fall Risks No Fall Risks  Follow up Education provided;Falls prevention discussed   Falls prevention discussed Falls prevention discussed;Education provided;Falls evaluation completed     Assessment & Plan  1. Type 2 diabetes mellitus with stage 3a  chronic kidney disease, without long-term current use of insulin (HCC)  - HM Diabetes Foot Exam - POCT HgB A1C - pioglitazone (ACTOS) 15 MG tablet; Take 1 tablet (15 mg total) by mouth daily.  Dispense: 90 tablet; Refill: 1  2. Bipolar I disorder, most recent episode (or current) manic (HCC)  Seeing [employer]  3. Breast cancer screening by mammogram  - MM 3D SCREENING MAMMOGRAM BILATERAL BREAST; Future  4. Mucopurulent chronic  bronchitis (HCC)  - Ambulatory referral to Pulmonology - Budeson-Glycopyrrol-Formoterol (BREZTRI AEROSPHERE) 160-9-4.8 MCG/ACT AERO; Inhale 2 puffs into the lungs in the morning and at bedtime.  Dispense: 5.9 g; Refill: 1  5. Atherosclerosis of aorta (HCC)  On statin therapy   6. Stage 3a chronic kidney disease (HCC)  We will stop arb due to low bp   7. Rectal cancer (HCC)  With metastatic disease    8. Metastasis to liver [facility])  Under the care of Dr. Cathie Hoops  9. Need for immunization against influenza  - Flu vaccine trivalent PF, 6mos and older(Flulaval,Afluria,Fluarix,Fluzone)  10. Hypothyroidism due to medication  Continue medication  11. OSA (obstructive sleep apnea)  - Ambulatory referral to Pulmonology

## 2023-10-09 ENCOUNTER — Other Ambulatory Visit: Payer: Self-pay | Admitting: Family Medicine

## 2023-10-09 ENCOUNTER — Encounter: Payer: Self-pay | Admitting: Family Medicine

## 2023-10-09 ENCOUNTER — Ambulatory Visit (INDEPENDENT_AMBULATORY_CARE_PROVIDER_SITE_OTHER): Payer: 59 | Admitting: Family Medicine

## 2023-10-09 VITALS — BP 112/54 | HR 90 | Temp 97.9°F | Resp 16 | Ht 59.0 in | Wt 142.5 lb

## 2023-10-09 DIAGNOSIS — N1831 Chronic kidney disease, stage 3a: Secondary | ICD-10-CM | POA: Diagnosis not present

## 2023-10-09 DIAGNOSIS — C20 Malignant neoplasm of rectum: Secondary | ICD-10-CM

## 2023-10-09 DIAGNOSIS — Z1231 Encounter for screening mammogram for malignant neoplasm of breast: Secondary | ICD-10-CM | POA: Diagnosis not present

## 2023-10-09 DIAGNOSIS — F3110 Bipolar disorder, current episode manic without psychotic features, unspecified: Secondary | ICD-10-CM

## 2023-10-09 DIAGNOSIS — E032 Hypothyroidism due to medicaments and other exogenous substances: Secondary | ICD-10-CM | POA: Diagnosis not present

## 2023-10-09 DIAGNOSIS — Z23 Encounter for immunization: Secondary | ICD-10-CM | POA: Diagnosis not present

## 2023-10-09 DIAGNOSIS — E1122 Type 2 diabetes mellitus with diabetic chronic kidney disease: Secondary | ICD-10-CM

## 2023-10-09 DIAGNOSIS — G4733 Obstructive sleep apnea (adult) (pediatric): Secondary | ICD-10-CM | POA: Diagnosis not present

## 2023-10-09 DIAGNOSIS — C787 Secondary malignant neoplasm of liver and intrahepatic bile duct: Secondary | ICD-10-CM

## 2023-10-09 DIAGNOSIS — J411 Mucopurulent chronic bronchitis: Secondary | ICD-10-CM

## 2023-10-09 DIAGNOSIS — I700 Atherosclerosis of aorta: Secondary | ICD-10-CM | POA: Diagnosis not present

## 2023-10-09 DIAGNOSIS — E1129 Type 2 diabetes mellitus with other diabetic kidney complication: Secondary | ICD-10-CM

## 2023-10-09 LAB — POCT GLYCOSYLATED HEMOGLOBIN (HGB A1C): Hemoglobin A1C: 6.2 % — AB (ref 4.0–5.6)

## 2023-10-09 MED ORDER — PIOGLITAZONE HCL 15 MG PO TABS: 15.0000 mg | ORAL_TABLET | Freq: Every day | ORAL | 1 refills | Status: AC

## 2023-10-09 MED ORDER — BREZTRI AEROSPHERE 160-9-4.8 MCG/ACT IN AERO
2.0000 | INHALATION_SPRAY | Freq: Two times a day (BID) | RESPIRATORY_TRACT | 1 refills | Status: DC
Start: 2023-10-09 — End: 2024-02-05

## 2023-10-11 NOTE — Telephone Encounter (Signed)
I spoke with April Luna letting her know I had CXL for PFT on 10/16/23 asking if this appt would work for her due to no more PFT appts available before 10/18/23 appt with Dr. Aundria Rud.  She stated to just CXL her appt she was waiting for [facility] to call about an appt for dental work. She would call us back to reschedule.

## 2023-10-16 ENCOUNTER — Inpatient Hospital Stay: Payer: 59

## 2023-10-16 ENCOUNTER — Telehealth: Payer: Self-pay | Admitting: Oncology

## 2023-10-16 ENCOUNTER — Other Ambulatory Visit: Payer: Self-pay | Admitting: Oncology

## 2023-10-16 ENCOUNTER — Inpatient Hospital Stay: Payer: 59 | Admitting: Oncology

## 2023-10-16 DIAGNOSIS — C20 Malignant neoplasm of rectum: Secondary | ICD-10-CM

## 2023-10-16 NOTE — Assessment & Plan Note (Deleted)
History of Stage III. pT3 pN1b cM0, s/p APR. 05/2023 Stage IV current rectal adenocarcinoma Currently on adjuvant chemotherapy. S/p FOLFOX x 4 cycles S/p concurrent chemotherapy [Xeloda 1300 mg BID] with RT, and then another 4 cycles of adjuvant FOLFOX [dose reduced oxaliplatin and omit 5-FU bolus 05/2023 CT imaging indicates disease progression. liver biopsy pathology showed metastatic carcinoma--> FOLFIRI + Bevacizumab   [facility] NGS showed TP53 missense variant, APC frameshift, TCF7L2 Frameshift, FLT3 copy number gain., TMB 4.7, pMMR Wildtype KRAS/NRAS Labs are reviewed and discussed with patient.

## 2023-10-16 NOTE — Telephone Encounter (Signed)
Pt no showed appts today 10/22. I spoke with pt and she stated that someone called her and canceled the appts as they had no openings until November. I told her that the appts were not canceled and she was scheduled for today. This is her second no show.  I asked her if she can r/s for 10/30 and she said she can. I will mail out the AVS once appts are adjusted by the MD

## 2023-10-18 ENCOUNTER — Inpatient Hospital Stay: Payer: 59

## 2023-10-18 ENCOUNTER — Ambulatory Visit: Payer: 59 | Admitting: Student in an Organized Health Care Education/Training Program

## 2023-10-24 ENCOUNTER — Inpatient Hospital Stay: Payer: 59

## 2023-10-24 ENCOUNTER — Inpatient Hospital Stay (HOSPITAL_BASED_OUTPATIENT_CLINIC_OR_DEPARTMENT_OTHER): Payer: 59 | Admitting: Oncology

## 2023-10-24 ENCOUNTER — Encounter: Payer: Self-pay | Admitting: Oncology

## 2023-10-24 ENCOUNTER — Other Ambulatory Visit: Payer: 59

## 2023-10-24 VITALS — BP 100/71 | HR 79 | Temp 96.0°F | Resp 18 | Wt 142.7 lb

## 2023-10-24 DIAGNOSIS — J441 Chronic obstructive pulmonary disease with (acute) exacerbation: Secondary | ICD-10-CM | POA: Diagnosis not present

## 2023-10-24 DIAGNOSIS — I129 Hypertensive chronic kidney disease with stage 1 through stage 4 chronic kidney disease, or unspecified chronic kidney disease: Secondary | ICD-10-CM | POA: Diagnosis not present

## 2023-10-24 DIAGNOSIS — J9601 Acute respiratory failure with hypoxia: Secondary | ICD-10-CM | POA: Diagnosis not present

## 2023-10-24 DIAGNOSIS — G620 Drug-induced polyneuropathy: Secondary | ICD-10-CM

## 2023-10-24 DIAGNOSIS — F17210 Nicotine dependence, cigarettes, uncomplicated: Secondary | ICD-10-CM | POA: Diagnosis not present

## 2023-10-24 DIAGNOSIS — N189 Chronic kidney disease, unspecified: Secondary | ICD-10-CM | POA: Diagnosis not present

## 2023-10-24 DIAGNOSIS — R197 Diarrhea, unspecified: Secondary | ICD-10-CM | POA: Diagnosis not present

## 2023-10-24 DIAGNOSIS — Z803 Family history of malignant neoplasm of breast: Secondary | ICD-10-CM | POA: Diagnosis not present

## 2023-10-24 DIAGNOSIS — E1140 Type 2 diabetes mellitus with diabetic neuropathy, unspecified: Secondary | ICD-10-CM | POA: Diagnosis not present

## 2023-10-24 DIAGNOSIS — E039 Hypothyroidism, unspecified: Secondary | ICD-10-CM | POA: Diagnosis not present

## 2023-10-24 DIAGNOSIS — T451X5A Adverse effect of antineoplastic and immunosuppressive drugs, initial encounter: Secondary | ICD-10-CM | POA: Diagnosis not present

## 2023-10-24 DIAGNOSIS — Z800 Family history of malignant neoplasm of digestive organs: Secondary | ICD-10-CM | POA: Diagnosis not present

## 2023-10-24 DIAGNOSIS — C20 Malignant neoplasm of rectum: Secondary | ICD-10-CM

## 2023-10-24 DIAGNOSIS — Z87442 Personal history of urinary calculi: Secondary | ICD-10-CM | POA: Diagnosis not present

## 2023-10-24 DIAGNOSIS — F319 Bipolar disorder, unspecified: Secondary | ICD-10-CM | POA: Diagnosis not present

## 2023-10-24 DIAGNOSIS — E1122 Type 2 diabetes mellitus with diabetic chronic kidney disease: Secondary | ICD-10-CM | POA: Diagnosis not present

## 2023-10-24 DIAGNOSIS — E785 Hyperlipidemia, unspecified: Secondary | ICD-10-CM | POA: Diagnosis not present

## 2023-10-24 DIAGNOSIS — G4730 Sleep apnea, unspecified: Secondary | ICD-10-CM | POA: Diagnosis not present

## 2023-10-24 DIAGNOSIS — I700 Atherosclerosis of aorta: Secondary | ICD-10-CM | POA: Diagnosis not present

## 2023-10-24 DIAGNOSIS — F209 Schizophrenia, unspecified: Secondary | ICD-10-CM | POA: Diagnosis not present

## 2023-10-24 DIAGNOSIS — Z5111 Encounter for antineoplastic chemotherapy: Secondary | ICD-10-CM | POA: Diagnosis not present

## 2023-10-24 DIAGNOSIS — D122 Benign neoplasm of ascending colon: Secondary | ICD-10-CM | POA: Diagnosis not present

## 2023-10-24 DIAGNOSIS — N200 Calculus of kidney: Secondary | ICD-10-CM | POA: Diagnosis not present

## 2023-10-24 DIAGNOSIS — K219 Gastro-esophageal reflux disease without esophagitis: Secondary | ICD-10-CM | POA: Diagnosis not present

## 2023-10-24 LAB — CBC WITH DIFFERENTIAL (CANCER CENTER ONLY)
Abs Immature Granulocytes: 0.02 10*3/uL (ref 0.00–0.07)
Basophils Absolute: 0.1 10*3/uL (ref 0.0–0.1)
Basophils Relative: 1 %
Eosinophils Absolute: 0.2 10*3/uL (ref 0.0–0.5)
Eosinophils Relative: 3 %
HCT: 41.4 % (ref 36.0–46.0)
Hemoglobin: 13.4 g/dL (ref 12.0–15.0)
Immature Granulocytes: 0 %
Lymphocytes Relative: 12 %
Lymphs Abs: 0.8 10*3/uL (ref 0.7–4.0)
MCH: 32.8 pg (ref 26.0–34.0)
MCHC: 32.4 g/dL (ref 30.0–36.0)
MCV: 101.2 fL — ABNORMAL HIGH (ref 80.0–100.0)
Monocytes Absolute: 0.5 10*3/uL (ref 0.1–1.0)
Monocytes Relative: 8 %
Neutro Abs: 4.9 10*3/uL (ref 1.7–7.7)
Neutrophils Relative %: 76 %
Platelet Count: 133 10*3/uL — ABNORMAL LOW (ref 150–400)
RBC: 4.09 MIL/uL (ref 3.87–5.11)
RDW: 14.8 % (ref 11.5–15.5)
WBC Count: 6.4 10*3/uL (ref 4.0–10.5)
nRBC: 0 % (ref 0.0–0.2)

## 2023-10-24 LAB — CMP (CANCER CENTER ONLY)
ALT: 15 U/L (ref 0–44)
AST: 20 U/L (ref 15–41)
Albumin: 4.1 g/dL (ref 3.5–5.0)
Alkaline Phosphatase: 81 U/L (ref 38–126)
Anion gap: 8 (ref 5–15)
BUN: 18 mg/dL (ref 6–20)
CO2: 26 mmol/L (ref 22–32)
Calcium: 8.9 mg/dL (ref 8.9–10.3)
Chloride: 102 mmol/L (ref 98–111)
Creatinine: 1.09 mg/dL — ABNORMAL HIGH (ref 0.44–1.00)
GFR, Estimated: >60 mL/min (ref >60)
Glucose, Bld: 175 mg/dL — ABNORMAL HIGH (ref 70–99)
Potassium: 4.2 mmol/L (ref 3.5–5.1)
Sodium: 136 mmol/L (ref 135–145)
Total Bilirubin: 0.5 mg/dL (ref 0.3–1.2)
Total Protein: 7.1 g/dL (ref 6.5–8.1)

## 2023-10-24 LAB — PROTEIN, URINE, RANDOM: Total Protein, Urine: 10 mg/dL

## 2023-10-24 MED ORDER — FLUOROURACIL CHEMO INJECTION 2.5 GM/50ML
400.0000 mg/m2 | Freq: Once | INTRAVENOUS | Status: AC
  Administered 2023-10-24: 650 mg via INTRAVENOUS
  Filled 2023-10-24: qty 13

## 2023-10-24 MED ORDER — PREDNISONE 10 MG (21) PO TBPK: ORAL_TABLET | ORAL | 0 refills | Status: DC

## 2023-10-24 MED ORDER — ATROPINE SULFATE 1 MG/ML IV SOLN
0.5000 mg | Freq: Once | INTRAVENOUS | Status: AC | PRN
  Administered 2023-10-24: 0.5 mg via INTRAVENOUS
  Filled 2023-10-24: qty 1

## 2023-10-24 MED ORDER — SODIUM CHLORIDE 0.9 % IV SOLN
2400.0000 mg/m2 | INTRAVENOUS | Status: DC
  Administered 2023-10-24: 3850 mg via INTRAVENOUS
  Filled 2023-10-24: qty 77

## 2023-10-24 MED ORDER — PALONOSETRON HCL INJECTION 0.25 MG/5ML
0.2500 mg | Freq: Once | INTRAVENOUS | Status: AC
  Administered 2023-10-24: 0.25 mg via INTRAVENOUS
  Filled 2023-10-24: qty 5

## 2023-10-24 MED ORDER — SODIUM CHLORIDE 0.9 % IV SOLN
5.0000 mg/kg | Freq: Once | INTRAVENOUS | Status: AC
Start: 2023-10-24 — End: 2023-10-24
  Administered 2023-10-24: 300 mg via INTRAVENOUS
  Filled 2023-10-24: qty 12

## 2023-10-24 MED ORDER — DEXAMETHASONE SODIUM PHOSPHATE 10 MG/ML IJ SOLN
10.0000 mg | Freq: Once | INTRAMUSCULAR | Status: AC
  Administered 2023-10-24: 10 mg via INTRAVENOUS
  Filled 2023-10-24: qty 1

## 2023-10-24 MED ORDER — SODIUM CHLORIDE 0.9 % IV SOLN
Freq: Once | INTRAVENOUS | Status: AC
  Filled 2023-10-24: qty 250

## 2023-10-24 MED ORDER — SODIUM CHLORIDE 0.9 % IV SOLN
400.0000 mg/m2 | Freq: Once | INTRAVENOUS | Status: AC
  Administered 2023-10-24: 644 mg via INTRAVENOUS
  Filled 2023-10-24: qty 32.2

## 2023-10-24 MED ORDER — SODIUM CHLORIDE 0.9% FLUSH
10.0000 mL | Freq: Once | INTRAVENOUS | Status: AC
  Administered 2023-10-24: 10 mL via INTRAVENOUS
  Filled 2023-10-24: qty 10

## 2023-10-24 MED ORDER — SODIUM CHLORIDE 0.9 % IV SOLN
180.0000 mg/m2 | Freq: Once | INTRAVENOUS | Status: AC
  Administered 2023-10-24: 300 mg via INTRAVENOUS
  Filled 2023-10-24: qty 15

## 2023-10-24 NOTE — Patient Instructions (Addendum)
  [facility]  Discharge Instructions: Thank you for choosing [facility] to provide your oncology and hematology care.  If you have a lab appointment with the Cancer Center, please go directly to the Cancer Center and check in at the registration area.  Wear comfortable clothing and clothing appropriate for easy access to any Portacath or PICC line.   We strive to give you quality time with your provider. You may need to reschedule your appointment if you arrive late (15 or more minutes).  Arriving late affects you and other patients whose appointments are after yours.  Also, if you miss three or more appointments without notifying the office, you may be dismissed from the clinic at the provider's discretion.      For prescription refill requests, have your pharmacy contact our office and allow 72 hours for refills to be completed.    Today you received the following chemotherapy and/or immunotherapy agents: FOLFIRI + Bevacizumab    To help prevent nausea and vomiting after your treatment, we encourage you to take your nausea medication as directed.  BELOW ARE SYMPTOMS THAT SHOULD BE REPORTED IMMEDIATELY: *FEVER GREATER THAN 100.4 F (38 C) OR HIGHER *CHILLS OR SWEATING *NAUSEA AND VOMITING THAT IS NOT CONTROLLED WITH YOUR NAUSEA MEDICATION *UNUSUAL SHORTNESS OF BREATH *UNUSUAL BRUISING OR BLEEDING *URINARY PROBLEMS (pain or burning when urinating, or frequent urination) *BOWEL PROBLEMS (unusual diarrhea, constipation, pain near the anus) TENDERNESS IN MOUTH AND THROAT WITH OR WITHOUT PRESENCE OF ULCERS (sore throat, sores in mouth, or a toothache) UNUSUAL RASH, SWELLING OR PAIN  UNUSUAL VAGINAL DISCHARGE OR ITCHING   Items with * indicate a potential emergency and should be followed up as soon as possible or go to the Emergency Department if any problems should occur.  Please show the CHEMOTHERAPY ALERT CARD or IMMUNOTHERAPY ALERT CARD at  check-in to the Emergency Department and triage nurse.  Should you have questions after your visit or need to cancel or reschedule your appointment, please contact [facility]  [phone] and follow the prompts.  Office hours are 8:00 a.m. to 4:30 p.m. Monday - Friday. Please note that voicemails left after 4:00 p.m. may not be returned until the following business day.  We are closed weekends and major holidays. You have access to a nurse at all times for urgent questions. Please call the main number to the clinic [phone] and follow the prompts.  For any non-urgent questions, you may also contact your provider using MyChart. We now offer e-Visits for anyone 76 and older to request care online for non-urgent symptoms. For details visit [email or web address].   Also download the MyChart app! Go to the app store, search "MyChart", open the app, select [facility], and log in with your MyChart username and password.

## 2023-10-24 NOTE — Assessment & Plan Note (Signed)
Chemotherapy plan as listed above 

## 2023-10-24 NOTE — Assessment & Plan Note (Signed)
Grade 2  Continue gabapentin 300mg  BID.

## 2023-10-24 NOTE — Progress Notes (Signed)
Hematology/Oncology Progress note [phone] [phone]      CHIEF COMPLAINTS/REASON FOR VISIT:  Follow-up for rectal cancer treatments.  April Luna, April Luna, April Luna, cM0) - Signed by [name], MD on 05/03/2022 - Pathologic stage from 06/18/2023: Stage IVA (rpTX, April Luna, pM1a) - Signed by [name], MD on 06/18/2023   Rectal cancer [facility]) History of Stage III. pT3 April Luna cM0, s/p APR. 05/2023 Stage IV current rectal adenocarcinoma Currently on adjuvant chemotherapy. S/p FOLFOX x 4 cycles S/p concurrent chemotherapy [Xeloda 1300 mg BID] with RT, and then another 4 cycles of adjuvant FOLFOX [dose reduced oxaliplatin and omit 5-FU bolus 05/2023 CT imaging indicates disease progression. liver biopsy pathology showed metastatic carcinoma--> FOLFIRI + Bevacizumab   [employer] NGS showed TP53 missense variant, APC frameshift, TCF7L2 Frameshift, FLT3 copy number gain., TMB 4.7, pMMR Wildtype KRAS/NRAS Labs are reviewed and discussed with patient. Proceed with FOLFIRI + Bevacizumab     Encounter for antineoplastic chemotherapy Chemotherapy plan as listed above  Chemotherapy-induced neuropathy (HCC) Grade 2  Continue gabapentin 300mg  BID.    Orders Placed This Encounter  Procedures   CEA    Standing Status:   Future    Number of Occurrences:   1    Standing Expiration Date:   10/23/2024   CEA    Standing Status:   Future    Standing Expiration Date:   11/06/2024   Protein, urine, random    Standing Status:   Future    Standing Expiration Date:   11/06/2024   CBC with Differential (Cancer Center Only)    Standing Status:   Future    Standing Expiration Date:   11/06/2024   CMP (Cancer Center only)    Standing Status:   Future    Standing Expiration Date:   11/06/2024   CEA    Standing Status:   Future    Standing Expiration Date:    11/25/2024   Protein, urine, random    Standing Status:   Future    Standing Expiration Date:   11/25/2024   CBC with Differential (Cancer Center Only)    Standing Status:   Future    Standing Expiration Date:   11/25/2024   CMP (Cancer Center only)    Standing Status:   Future    Standing Expiration Date:   11/25/2024   CEA    Standing Status:   Future    Standing Expiration Date:   12/09/2024   Protein, urine, random    Standing Status:   Future    Standing Expiration Date:   12/09/2024   CBC with Differential (Cancer Center Only)    Standing Status:   Future    Standing Expiration Date:   12/09/2024   CMP (Cancer Center only)    Standing Status:   Future    Standing Expiration Date:   12/09/2024    Follow-up 2w lab MD chemo  All questions were answered. The patient knows to call the clinic with any problems, questions or concerns.  [name], MD, PhD [facility] Hematology Oncology 10/24/2023      HISTORY OF PRESENTING ILLNESS:   April Luna is a  52 y.o.  female presents for treatment of rectal cancer Oncology History  Rectal cancer (HCC)  02/26/2022 Imaging   MRI PELVIS WITHOUT CONTRAST- By imaging, rectal cancer stage:  T1/T2, N0, Mx  03/02/2022 Imaging   CT CHEST AND ABDOMEN WITH CONTRAST 1. No convincing evidence of metastatic disease within the chest or abdomen. 2. Atrophic left kidney with multifocal renal scarring and cortical calcifications as well as nonobstructive renal stones measuring up to 5 mm. 3. Prominent left-sided predominant retroperitoneal lymph nodes measuring up to 8 mm near the level of the renal hilum, overall decreased in size dating back to CT September 19, 2018 and favored reactive related to left renal inflammation. 4.  Aortic Atherosclerosis (ICD10-I70.0).   03/27/2022 Genetic Testing    [employer] CustomNext+RNA cancer panel found no pathogenic mutations.    04/21/2022 Initial Diagnosis   Rectal cancer - baseline CEA 3.6 -02/09/2022,  patient had colonoscopy which showed renal mass 10 cm from anal verge.  5 mm polyp in ascending colon.  Removed and retrieved. Pathology showed rectal adenocarcinoma.  The polyp in the ascending colon is a tubular adenoma.  MRI showed cT1/T2N0 disease  -04/21/2022, patient underwent robotic assisted ultralow anterior resection. Pathology showed moderately differentiated adenocarcinoma, 4.5 cm in maximal extent, with focal extension through muscularis propria into perirectal soft tissue.  3 lymph nodes positive for metastatic carcinoma.  Negative margin.  pT3 April Luna, MSI stable.   05/03/2022 Cancer Staging   Staging form: Colon and Luna, April Luna, April Luna, cM0) - Signed by [name], MD on 05/03/2022 Stage prefix: Initial diagnosis   05/11/2022 Miscellaneous   Medi port placed by Dr.White   05/26/2022 -  Chemotherapy   FOLFOX Q2 weeks x 4   05/26/2022 - 07/09/2022 Chemotherapy   Patient is on Treatment Plan : COLORECTAL FOLFOX q14d x 8 cycles     05/26/2022 - 12/13/2022 Chemotherapy   Patient is on Treatment Plan : COLORECTAL FOLFOX q14d x 4 months     07/27/2022 -  Chemotherapy   Concurrent chemotherapy- Xeloda  1300mg  BID and radiation.    07/27/2022 - 09/12/2022 Radiation Therapy    concurrent chemotherapy [Xeloda 1300 mg BID] with RT    09/20/2022 Imaging   CT Angiogram chest PET protocol There is no evidence of pulmonary artery embolism. There is no evidence of thoracic aortic dissection.   Small linear patchy alveolar infiltrate is seen in medial segment of right middle lobe suggesting atelectasis/pneumonia.     09/28/2022 - 09/30/2022 Hospital Admission   Admission due to acute respiratory failure with hypoxia, COPD exacerbation   09/28/2022 Imaging   CT angio chest PE protocol 1. Negative for acute PE or thoracic aortic dissection. 2. New ground-glass infiltrates anteriorly in bilateral upper lobes,may represent atypical edema,  infectious or inflammatory process. 3.  Aortic Atherosclerosis     12/22/2022 Imaging   CT chest abdomen pelvis w contrast  Stable exam. No evidence of recurrent or metastatic carcinoma within the chest, abdomen, or pelvis.   Left nephrolithiasis and renal parenchymal atrophy. No evidence of ureteral calculi or hydronephrosis.   Aortic Atherosclerosis   05/31/2023 Imaging   CT chest abdomen pelvis with contrast showed 1. New hypodense 16 mm lesion in the right lobe of the liver with very subtle adjacent 5 mm lesion, suspicious for metastatic disease. 2. Small bilateral pulmonary nodules, some of which were subtly evident on prior examination only in retrospect, some of which demonstrate degree of cavitation, suspicious for metastatic disease. 3. Prominent retroperitoneal lymph nodes are similar prior. 4. Prior low anterior resection with Hartmann's pouch formation,similar small amount of perirectal/presacral soft tissue and fluid,favored postsurgical/posttreatment change. Suggest continued attention on  follow-up imaging. 5. Large volume of formed stool in the colon. 6. Nonobstructive left renal calculi measure under 1 cm.     06/08/2023 Relapse/Recurrence   Ultrasound-guided liver biopsy showed Pathology showed metastatic carcinoma, morphology consistent with patient's clinical history of rectal adenocarcinoma.  [employer] NGS 648 gene panel showed TP53 missense variant, APC frameshift, TCF7L2 frameshift, FLT3 copy number gain, TMB 4.80m/MB MSI stable.  Wildtype KRAS/NRAS  [employer] RNA Seq - ERBB3 overexpressed, VEGFA overexpressed, NRAS overexpressed.     06/18/2023 Cancer Staging   Staging form: Colon and Luna, April 8th Edition - Pathologic stage from 06/18/2023: Stage IVA (rpTX, April Luna, pM1a) - Signed by [name], MD on 06/18/2023 Stage prefix: Recurrence Total positive nodes: 0   06/25/2023 - 06/25/2023 Chemotherapy   Patient is on Treatment Plan : COLORECTAL FOLFOX + Bevacizumab q14d      07/02/2023 -  Chemotherapy   Patient is on Treatment Plan : COLORECTAL FOLFIRI + Bevacizumab q14d     Patient has bipolar and schizophrenia..  Patient is married  She has housing issue due to previous history of eviction.  she does not live with her husband currently. She lives with some family members.   INTERVAL HISTORY MILEA VERNI is a 52 y.o. female who has above history reviewed by me today presents for follow up visit for rectal cancer.  She feels well today.  Denies any melena or blood in the stool. Stable neuropathy symptoms. Diarrhea managed by anti diarrhea medication Tooth pain, she is going to see [facility] dentist. She no showed to her chemotherapy and preferred to defer treatment until today     Review of Systems  Constitutional:  Positive for fatigue. Negative for appetite change, chills and fever.  HENT:   Positive for mouth sores. Negative for hearing loss and voice change.   Eyes:  Negative for eye problems.  Respiratory:  Negative for chest tightness, cough and shortness of breath.   Cardiovascular:  Negative for chest pain.  Gastrointestinal:  Negative for abdominal distention, abdominal pain, blood in stool, diarrhea and nausea.       + colostomy  Endocrine: Negative for hot flashes.  Genitourinary:  Negative for difficulty urinating, dysuria and frequency.   Musculoskeletal:  Negative for arthralgias.  Skin:  Negative for itching and rash.  Neurological:  Positive for numbness. Negative for extremity weakness.  Hematological:  Negative for adenopathy.  Psychiatric/Behavioral:  Negative for confusion.     MEDICAL HISTORY:  Past Medical History:  Diagnosis Date   Allergy    pollen   Anxiety    Arthritis    right hip   Bipolar 1 disorder (HCC)    Cancer (HCC)    rectal   Chemotherapy-induced neuropathy (HCC) 10/24/2022   Chronic kidney disease    COPD (chronic obstructive pulmonary disease) (HCC)    Depression    Family history of adverse  reaction to anesthesia    grand father had a stroke during anesthesia   Family history of breast cancer    Family history of colon cancer    Family history of uterine cancer    GERD (gastroesophageal reflux disease)    History of kidney stones    Hyperlipidemia    Hypertension    Hypothyroidism    Panic attack    Pneumonia    Psoriasis    Sleep apnea 08/11/2021   No CPAP   Type 2 diabetes mellitus with microalbuminuria, without long-term current use of insulin (HCC) 06/24/2019    SURGICAL HISTORY:  Past Surgical History:  Procedure Laterality Date   BREAST BIOPSY Left 12/14/2021   [place] bx, venus marker, path pending   CESAREAN SECTION     COLONOSCOPY WITH PROPOFOL N/A 02/09/2022   Procedure: COLONOSCOPY WITH PROPOFOL;  Surgeon: Toney Reil, MD;  Location: [facility];  Service: Endoscopy;  Laterality: N/A;  sleep apnea   CYSTOSCOPY W/ RETROGRADES Left 11/08/2018   Procedure: CYSTOSCOPY WITH RETROGRADE PYELOGRAM;  Surgeon: Sondra Come, MD;  Location: [facility];  Service: Urology;  Laterality: Left;   CYSTOSCOPY/URETEROSCOPY/HOLMIUM LASER/STENT PLACEMENT Left 11/08/2018   Procedure: CYSTOSCOPY/URETEROSCOPY/HOLMIUM LASER/STENT PLACEMENT;  Surgeon: Sondra Come, MD;  Location: [facility];  Service: Urology;  Laterality: Left;   IR CV LINE INJECTION  06/25/2023   IR IMAGING GUIDED PORT INSERTION  05/11/2022   IR REMOVE CV FIBRIN SHEATH  06/27/2023   MOUTH SURGERY     wisdom teeth extraction   MOUTH SURGERY     teeth removal   POLYPECTOMY N/A 02/09/2022   Procedure: POLYPECTOMY;  Surgeon: Toney Reil, MD;  Location: [facility];  Service: Endoscopy;  Laterality: N/A;   XI ROBOTIC ASSISTED LOWER ANTERIOR RESECTION N/A 04/21/2022   Procedure: XI ROBOTIC ASSISTED LOWER ANTERIOR RESECTION WITH COLOSTOMY, BILATERAL TAP BLOCK, April OF TISSUE PERFUSSION WITH FIREFLY INJECTION;  Surgeon: Andria Meuse, MD;  Location: [facility];  Service: General;   Laterality: N/A;    SOCIAL HISTORY: Social History   Socioeconomic History   Marital status: Married    Spouse name: Lesle Chris   Number of children: 1   Years of education: 12   Highest education level: High school graduate  Occupational History   Occupation: unemployed    Comment: disabled  Tobacco Use   Smoking status: Some Days    Current packs/day: 0.25    Average packs/day: 0.3 packs/day for 38.4 years (9.6 ttl pk-yrs)    Types: Cigarettes    Start date: 05/13/1985    Passive exposure: Current   Smokeless tobacco: Never   Tobacco comments:    5-6 cigarettes weekly- 11/07/2022  Vaping Use   Vaping status: Former   Start date: 05/25/2018   Quit date: 09/24/2018  Substance and Sexual Activity   Alcohol use: Not Currently    Alcohol/week: 4.0 standard drinks of alcohol    Types: 4 Glasses of wine per week    Comment: quit ETOH in Feb. 2023   Drug use: Yes    Types: Marijuana    Comment: 2 days ago   Sexual activity: Not Currently    Birth control/protection: Post-menopausal  Other Topics Concern   Not on file  Social History Narrative   Lives with husband and son    Social Determinants of Health   Financial Resource Strain: High Risk (08/30/2023)   Overall Financial Resource Strain (CARDIA)    Difficulty of Paying Living Expenses: Hard  Food Insecurity: Food Insecurity Present (08/30/2023)   Hunger Vital Sign    Worried About Running Out of Food in the Last Year: Sometimes true    Ran Out of Food in the Last Year: Sometimes true  Transportation Needs: No Transportation Needs (08/30/2023)   PRAPARE - Administrator, Civil Service (Medical): No    Lack of Transportation (Non-Medical): No  Physical Activity: Inactive (08/30/2023)   Exercise Vital Sign    Days of Exercise per Week: 0 days    Minutes of Exercise per Session: 0 min  Stress: Stress Concern Present (08/30/2023)  [employer] of Occupational Health - Occupational Stress Questionnaire     Feeling of Stress : Very much  Social Connections: Moderately Integrated (08/30/2023)   Social Connection and Isolation Panel [NHANES]    Frequency of Communication with Friends and Family: Never    Frequency of Social Gatherings with Friends and Family: Once a week    Attends Religious Services: More than 4 times per year    Active Member of [employer] or Organizations: No    Attends Engineer, structural: 1 to 4 times per year    Marital Status: Married  Catering manager Violence: Not At Risk (08/30/2023)   Humiliation, Afraid, Rape, and Kick questionnaire    Fear of Current or Ex-Partner: No    Emotionally Abused: No    Physically Abused: No    Sexually Abused: No    FAMILY HISTORY: Family History  Problem Relation Age of Onset   Depression Mother    Anxiety disorder Mother    Diabetes Mother    Hypertension Mother    Hyperlipidemia Mother    Cancer Mother    Uterine cancer Mother 65   Cervical cancer Mother 65   Colon cancer Father    Depression Brother    Anxiety disorder Brother    Cancer Maternal Aunt        unk types   Diabetes Mellitus II Maternal Grandmother    Hypercholesterolemia Maternal Grandmother    Breast cancer Maternal Grandmother    Cancer Paternal Grandmother    Diabetes Paternal Grandmother    Melanoma Paternal Grandmother    Stomach cancer Paternal Grandmother     ALLERGIES:  is allergic to metformin and related, nsaids, perphenazine, sulfa antibiotics, abilify [aripiprazole], and penicillins.  MEDICATIONS:  Current Outpatient Medications  Medication Sig Dispense Refill   ACCU-CHEK GUIDE test strip USE UP TO FOUR TIMES DAILY AS DIRECTED. 100 each 5   acetaminophen (TYLENOL) 325 MG tablet Take 650 mg by mouth every 6 (six) hours as needed for headache (pain).     albuterol (VENTOLIN HFA) 108 (90 Base) MCG/ACT inhaler Inhale 2 puffs into the lungs every 6 (six) hours as needed for wheezing or shortness of breath. 1 each 5   amantadine  (SYMMETREL) 100 MG capsule Take 100 mg by mouth 2 (two) times daily.     blood glucose meter kit and supplies Dispense based on patient and insurance preference. Use up to four times daily as directed. (FOR ICD-10 E10.9, E11.9). 1 each 0   Budeson-Glycopyrrol-Formoterol (BREZTRI AEROSPHERE) 160-9-4.8 MCG/ACT AERO Inhale 2 puffs into the lungs in the morning and at bedtime. 5.9 g 1   buPROPion (WELLBUTRIN XL) 300 MG 24 hr tablet Take 300 mg by mouth daily.     Cholecalciferol (VITAMIN D-3) 125 MCG (5000 UT) TABS Take 5,000 Units by mouth daily. 30 tablet 1   clonazePAM (KLONOPIN) 0.5 MG tablet Take 0.5 mg by mouth daily.     dapagliflozin propanediol (FARXIGA) 10 MG TABS tablet Take 1 tablet (10 mg total) by mouth daily before breakfast. 90 tablet 1   dexamethasone (DECADRON) 4 MG tablet Take 2 tablets (8 mg total) by mouth See admin instructions. Take 8mg  daily for 2 days after each chemotherapy. 30 tablet 1   diltiazem (CARDIZEM CD) 120 MG 24 hr capsule Take 1 capsule (120 mg total) by mouth daily. 90 capsule 1   docusate sodium (COLACE) 100 MG capsule TAKE 1 CAPSULE BY MOUTH TWICE DAILY 60 capsule 1   gabapentin (NEURONTIN) 300 MG  capsule Take 300 mg by mouth 2 (two) times daily.     hydrocortisone cream 0.5 % Apply 1 Application topically 3 (three) times daily. 30 g 1   Insulin Pen Needle 32G X 4 MM MISC 1 each by Does not apply route daily at 12 noon. 100 each 0   ipratropium-albuterol (DUONEB) 0.5-2.5 (3) MG/3ML SOLN Take 3 mLs by nebulization every 6 (six) hours as needed. 360 mL 11   levothyroxine (SYNTHROID) 75 MCG tablet TAKE 1 TABLET BY MOUTH DAILY BEFORE BREAKFAST 90 tablet 2   lidocaine-prilocaine (EMLA) cream Apply to affected area once 30 g 3   loperamide (IMODIUM) 2 MG capsule Take 1 capsule (2 mg total) by mouth See admin instructions. Initial: 4 mg,the 2 mg every 2 hours (4 mg every 4 hours at night)  maximum: 16 mg/day 60 capsule 2   loratadine (CLARITIN) 10 MG tablet Take 10 mg by  mouth every morning.     magic mouthwash w/lidocaine SOLN Take 5 mLs by mouth 4 (four) times daily as needed for mouth pain. Sig: Swish/Swallow 5-10 ml four times a day as needed. Dispense 480 ml. 1RF 480 mL 1   NON FORMULARY Pt uses a cpap nightly     nystatin cream (MYCOSTATIN) Apply 1 Application topically 2 (two) times daily. 30 g 0   ondansetron (ZOFRAN) 8 MG tablet Take 1 tablet (8 mg total) by mouth every 8 (eight) hours as needed for nausea, vomiting or refractory nausea / vomiting. Start on the third day after chemotherapy. 30 tablet 1   oxybutynin (DITROPAN-XL) 10 MG 24 hr tablet Take 1 tablet (10 mg total) by mouth daily. 30 tablet 11   paliperidone (INVEGA SUSTENNA) 234 MG/1.5ML SUSY injection Inject 234 mg into the muscle every 30 (thirty) days. On or about the 14th of each month     pantoprazole (PROTONIX) 40 MG tablet TAKE 1 TABLET BY MOUTH DAILY 90 tablet 2   Pediatric Multivit-Minerals-C (CHEWABLES MULTIVITAMIN PO) Take 1 tablet by mouth daily.     pioglitazone (ACTOS) 15 MG tablet Take 1 tablet (15 mg total) by mouth daily. 90 tablet 1   predniSONE (STERAPRED UNI-PAK 21 TAB) 10 MG (21) TBPK tablet Day 1 take 6 tablets, Day 2 take 5 tablets, Day 3 take 4 tablets, Day 4 take 3 tablets, Day 5 take 2 tablets D6 take 1 tablet 21 each 0   promethazine (PHENERGAN) 12.5 MG tablet Take 1 tablet (12.5 mg total) by mouth every 6 (six) hours as needed for nausea or vomiting. 30 tablet 0   rosuvastatin (CRESTOR) 40 MG tablet TAKE 1 TABLET BY MOUTH EVERY MORNING 30 tablet 3   sertraline (ZOLOFT) 100 MG tablet Take 200 mg by mouth every morning.     Spacer/Aero-Holding Chambers (AEROCHAMBER MV) inhaler Use as instructed 1 each 0   VASCEPA 1 g capsule TAKE 2 CAPSULES BY MOUTH 2 TIMES DAILY 120 capsule 3   Hydrocortisone, Perianal, 1 % CREA Apply topically. (Patient not taking: Reported on 10/24/2023)     No current facility-administered medications for this visit.   Facility-Administered  Medications Ordered in Other Visits  Medication Dose Route Frequency Provider Last Rate Last Admin   albuterol (PROVENTIL) (2.5 MG/3ML) 0.083% nebulizer solution 2.5 mg  2.5 mg Nebulization Once Raechel Chute, MD       fluorouracil (ADRUCIL) 3,850 mg in sodium chloride 0.9 % 73 mL chemo infusion  2,400 mg/m2 (Treatment Plan Recorded) Intravenous 1 day or 1 dose [name], MD  fluorouracil (ADRUCIL) chemo injection 650 mg  400 mg/m2 (Treatment Plan Recorded) Intravenous Once [name], MD       irinotecan (CAMPTOSAR) 300 mg in sodium chloride 0.9 % 500 mL chemo infusion  180 mg/m2 (Treatment Plan Recorded) Intravenous Once [name], MD 343 mL/hr at 10/24/23 1156 300 mg at 10/24/23 1156   leucovorin 644 mg in sodium chloride 0.9 % 250 mL infusion  400 mg/m2 (Treatment Plan Recorded) Intravenous Once [name], MD 188 mL/hr at 10/24/23 1154 644 mg at 10/24/23 1154   sodium chloride flush (NS) 0.9 % injection 10 mL  10 mL Intracatheter PRN [name], MD   10 mL at 08/15/23 1327     PHYSICAL EXAMINATION: ECOG PERFORMANCE STATUS: 0 - Asymptomatic Vitals:   10/24/23 0848  BP: 100/71  Pulse: 79  Resp: 18  Temp: (!) 96 F (35.6 C)  SpO2: 99%    Filed Weights   10/24/23 0848  Weight: 142 lb 11.2 oz (64.7 kg)     Physical Exam Constitutional:      General: She is not in acute distress. HENT:     Head: Normocephalic and atraumatic.     Mouth/Throat:     Comments: mucositis Eyes:     General: No scleral icterus. Cardiovascular:     Rate and Rhythm: Normal rate and regular rhythm.  Pulmonary:     Effort: Pulmonary effort is normal. No respiratory distress.     Breath sounds: No wheezing.     Comments: Decreased breath sound bilaterally Abdominal:     General: Bowel sounds are normal. There is no distension.     Palpations: Abdomen is soft.     Comments: +colostomy   Musculoskeletal:        General: No deformity. Normal range of motion.     Cervical back: Normal range of motion  and neck supple.  Skin:    General: Skin is warm and dry.     Findings: No erythema or rash.  Neurological:     Mental Status: She is alert and oriented to person, place, and time. Mental status is at baseline.     Cranial Nerves: No cranial nerve deficit.     Coordination: Coordination normal.  Psychiatric:        Mood and Affect: Mood normal.     LABORATORY DATA:  I have reviewed the data as listed    Latest Ref Rng & Units 10/24/2023    8:25 AM 09/17/2023    9:32 AM 09/03/2023    9:18 AM  CBC  WBC 4.0 - 10.5 K/uL 6.4  4.9  5.7   Hemoglobin 12.0 - 15.0 g/dL 40.9  81.1  91.4   Hematocrit 36.0 - 46.0 % 41.4  39.3  40.3   Platelets 150 - 400 K/uL 133  133  143       Latest Ref Rng & Units 10/24/2023    8:25 AM 09/17/2023    9:32 AM 09/03/2023    9:18 AM  CMP  Glucose 70 - 99 mg/dL 782  956  213   BUN 6 - 20 mg/dL 18  18  18    Creatinine 0.44 - 1.00 mg/dL 0.86  5.78  4.69   Sodium 135 - 145 mmol/L 136  138  136   Potassium 3.5 - 5.1 mmol/L 4.2  4.2  4.1   Chloride 98 - 111 mmol/L 102  102  102   CO2 22 - 32 mmol/L 26  26  27  Calcium 8.9 - 10.3 mg/dL 8.9  9.4  9.2   Total Protein 6.5 - 8.1 g/dL 7.1  7.0  6.8   Total Bilirubin 0.3 - 1.2 mg/dL 0.5  0.4  0.4   Alkaline Phos 38 - 126 U/L 81  96  105   AST 15 - 41 U/L 20  19  16    ALT 0 - 44 U/L 15  15  14          RADIOGRAPHIC STUDIES: I have personally reviewed the radiological images as listed and agreed with the findings in the report. No results found.

## 2023-10-24 NOTE — Assessment & Plan Note (Signed)
History of Stage III. pT3 pN1b cM0, s/p APR. 05/2023 Stage IV current rectal adenocarcinoma Currently on adjuvant chemotherapy. S/p FOLFOX x 4 cycles S/p concurrent chemotherapy [Xeloda 1300 mg BID] with RT, and then another 4 cycles of adjuvant FOLFOX [dose reduced oxaliplatin and omit 5-FU bolus 05/2023 CT imaging indicates disease progression. liver biopsy pathology showed metastatic carcinoma--> FOLFIRI + Bevacizumab   [facility] NGS showed TP53 missense variant, APC frameshift, TCF7L2 Frameshift, FLT3 copy number gain., TMB 4.7, pMMR Wildtype KRAS/NRAS Labs are reviewed and discussed with patient. Proceed with FOLFIRI + Bevacizumab

## 2023-10-24 NOTE — Progress Notes (Signed)
Ok per MD to proceed with chemo and premeds while waiting for Urine resutls. If urine results within parameter then proceed with bev

## 2023-10-25 LAB — CEA: CEA: 4.7 ng/mL (ref 0.0–4.7)

## 2023-10-26 ENCOUNTER — Inpatient Hospital Stay: Payer: 59 | Attending: Oncology

## 2023-10-26 ENCOUNTER — Inpatient Hospital Stay: Payer: 59

## 2023-10-26 VITALS — BP 115/84 | HR 86 | Resp 20

## 2023-10-26 DIAGNOSIS — Z9221 Personal history of antineoplastic chemotherapy: Secondary | ICD-10-CM | POA: Insufficient documentation

## 2023-10-26 DIAGNOSIS — R300 Dysuria: Secondary | ICD-10-CM | POA: Insufficient documentation

## 2023-10-26 DIAGNOSIS — C20 Malignant neoplasm of rectum: Secondary | ICD-10-CM

## 2023-10-26 DIAGNOSIS — F17210 Nicotine dependence, cigarettes, uncomplicated: Secondary | ICD-10-CM | POA: Insufficient documentation

## 2023-10-26 DIAGNOSIS — Z95828 Presence of other vascular implants and grafts: Secondary | ICD-10-CM

## 2023-10-26 DIAGNOSIS — C787 Secondary malignant neoplasm of liver and intrahepatic bile duct: Secondary | ICD-10-CM | POA: Insufficient documentation

## 2023-10-26 DIAGNOSIS — Z923 Personal history of irradiation: Secondary | ICD-10-CM | POA: Insufficient documentation

## 2023-10-26 MED ORDER — SODIUM CHLORIDE 0.9% FLUSH
10.0000 mL | Freq: Once | INTRAVENOUS | Status: DC
Start: 2023-10-26 — End: 2023-10-26
  Filled 2023-10-26: qty 10

## 2023-10-26 MED ORDER — HEPARIN SOD (PORK) LOCK FLUSH 100 UNIT/ML IV SOLN
500.0000 [IU] | Freq: Once | INTRAVENOUS | Status: DC
Start: 2023-10-26 — End: 2023-10-26
  Filled 2023-10-26: qty 5

## 2023-10-29 ENCOUNTER — Telehealth: Payer: Self-pay

## 2023-10-29 NOTE — Telephone Encounter (Signed)
Transition Care Management Follow-up Telephone Call Date of discharge and from where: 09/23/2023 [facility] How have you been since you were released from the hospital? Patient stated her dental infection has cleared up. Any questions or concerns? No  Items Reviewed: Did the pt receive and understand the discharge instructions provided? Yes  Medications obtained and verified? Yes  Other? No  Any new allergies since your discharge? No  Dietary orders reviewed? Yes Do you have support at home? Yes   Follow up appointments reviewed:  PCP Hospital f/u appt confirmed? Yes  Scheduled to see Alba Cory, MD on 10/09/2023 @ [facility]. Specialist Hospital f/u appt confirmed?  Patient will follow-up with her dentist.  Scheduled to see  on  @ . Are transportation arrangements needed? No  If their condition worsens, is the pt aware to call PCP or go to the Emergency Dept.? Yes Was the patient provided with contact information for the PCP's office or ED? Yes Was to pt encouraged to call back with questions or concerns? Yes   April Luna [facility]  [facility], [facility] Guide Direct Dial: [phone]  Website: [email or web address]

## 2023-11-02 ENCOUNTER — Encounter: Payer: Self-pay | Admitting: Oncology

## 2023-11-02 DIAGNOSIS — Z933 Colostomy status: Secondary | ICD-10-CM | POA: Diagnosis not present

## 2023-11-07 ENCOUNTER — Inpatient Hospital Stay: Payer: 59 | Admitting: Oncology

## 2023-11-07 ENCOUNTER — Telehealth: Payer: Self-pay | Admitting: Family Medicine

## 2023-11-07 ENCOUNTER — Inpatient Hospital Stay: Payer: 59

## 2023-11-07 ENCOUNTER — Other Ambulatory Visit: Payer: Self-pay | Admitting: Oncology

## 2023-11-07 ENCOUNTER — Encounter: Payer: Self-pay | Admitting: Oncology

## 2023-11-07 ENCOUNTER — Other Ambulatory Visit: Payer: Self-pay

## 2023-11-07 ENCOUNTER — Other Ambulatory Visit: Payer: Self-pay | Admitting: Family Medicine

## 2023-11-07 VITALS — BP 107/83 | HR 76 | Temp 97.4°F | Resp 18 | Wt 142.2 lb

## 2023-11-07 DIAGNOSIS — T451X5A Adverse effect of antineoplastic and immunosuppressive drugs, initial encounter: Secondary | ICD-10-CM | POA: Diagnosis not present

## 2023-11-07 DIAGNOSIS — Z5111 Encounter for antineoplastic chemotherapy: Secondary | ICD-10-CM

## 2023-11-07 DIAGNOSIS — R000 Tachycardia, unspecified: Secondary | ICD-10-CM

## 2023-11-07 DIAGNOSIS — E1129 Type 2 diabetes mellitus with other diabetic kidney complication: Secondary | ICD-10-CM

## 2023-11-07 DIAGNOSIS — K521 Toxic gastroenteritis and colitis: Secondary | ICD-10-CM | POA: Diagnosis not present

## 2023-11-07 DIAGNOSIS — R300 Dysuria: Secondary | ICD-10-CM

## 2023-11-07 DIAGNOSIS — Z9221 Personal history of antineoplastic chemotherapy: Secondary | ICD-10-CM | POA: Diagnosis not present

## 2023-11-07 DIAGNOSIS — R309 Painful micturition, unspecified: Secondary | ICD-10-CM | POA: Diagnosis not present

## 2023-11-07 DIAGNOSIS — C20 Malignant neoplasm of rectum: Secondary | ICD-10-CM | POA: Diagnosis not present

## 2023-11-07 DIAGNOSIS — G620 Drug-induced polyneuropathy: Secondary | ICD-10-CM | POA: Diagnosis not present

## 2023-11-07 DIAGNOSIS — E1169 Type 2 diabetes mellitus with other specified complication: Secondary | ICD-10-CM

## 2023-11-07 DIAGNOSIS — F17210 Nicotine dependence, cigarettes, uncomplicated: Secondary | ICD-10-CM | POA: Diagnosis not present

## 2023-11-07 DIAGNOSIS — I10 Essential (primary) hypertension: Secondary | ICD-10-CM

## 2023-11-07 DIAGNOSIS — Z923 Personal history of irradiation: Secondary | ICD-10-CM | POA: Diagnosis not present

## 2023-11-07 DIAGNOSIS — C787 Secondary malignant neoplasm of liver and intrahepatic bile duct: Secondary | ICD-10-CM | POA: Diagnosis not present

## 2023-11-07 LAB — CBC WITH DIFFERENTIAL (CANCER CENTER ONLY)
Abs Immature Granulocytes: 0.01 10*3/uL (ref 0.00–0.07)
Basophils Absolute: 0 10*3/uL (ref 0.0–0.1)
Basophils Relative: 1 %
Eosinophils Absolute: 0.1 10*3/uL (ref 0.0–0.5)
Eosinophils Relative: 2 %
HCT: 40.8 % (ref 36.0–46.0)
Hemoglobin: 13.3 g/dL (ref 12.0–15.0)
Immature Granulocytes: 0 %
Lymphocytes Relative: 18 %
Lymphs Abs: 0.7 10*3/uL (ref 0.7–4.0)
MCH: 32.7 pg (ref 26.0–34.0)
MCHC: 32.6 g/dL (ref 30.0–36.0)
MCV: 100.2 fL — ABNORMAL HIGH (ref 80.0–100.0)
Monocytes Absolute: 0.3 10*3/uL (ref 0.1–1.0)
Monocytes Relative: 8 %
Neutro Abs: 2.7 10*3/uL (ref 1.7–7.7)
Neutrophils Relative %: 71 %
Platelet Count: 123 10*3/uL — ABNORMAL LOW (ref 150–400)
RBC: 4.07 MIL/uL (ref 3.87–5.11)
RDW: 14.6 % (ref 11.5–15.5)
WBC Count: 3.8 10*3/uL — ABNORMAL LOW (ref 4.0–10.5)
nRBC: 0 % (ref 0.0–0.2)

## 2023-11-07 LAB — URINALYSIS, COMPLETE (UACMP) WITH MICROSCOPIC
Bilirubin Urine: NEGATIVE
Glucose, UA: >=500 mg/dL — AB
Hgb urine dipstick: NEGATIVE
Ketones, ur: NEGATIVE mg/dL
Nitrite: NEGATIVE
Protein, ur: NEGATIVE mg/dL
Specific Gravity, Urine: 1.017 (ref 1.005–1.030)
pH: 5 (ref 5.0–8.0)

## 2023-11-07 LAB — CMP (CANCER CENTER ONLY)
ALT: 15 U/L (ref 0–44)
AST: 18 U/L (ref 15–41)
Albumin: 4 g/dL (ref 3.5–5.0)
Alkaline Phosphatase: 72 U/L (ref 38–126)
Anion gap: 8 (ref 5–15)
BUN: 14 mg/dL (ref 6–20)
CO2: 27 mmol/L (ref 22–32)
Calcium: 9.2 mg/dL (ref 8.9–10.3)
Chloride: 101 mmol/L (ref 98–111)
Creatinine: 1.01 mg/dL — ABNORMAL HIGH (ref 0.44–1.00)
GFR, Estimated: >60 mL/min (ref >60)
Glucose, Bld: 90 mg/dL (ref 70–99)
Potassium: 4.5 mmol/L (ref 3.5–5.1)
Sodium: 136 mmol/L (ref 135–145)
Total Bilirubin: 0.5 mg/dL (ref <1.2)
Total Protein: 6.9 g/dL (ref 6.5–8.1)

## 2023-11-07 LAB — PROTEIN, URINE, RANDOM: Total Protein, Urine: <6 mg/dL

## 2023-11-07 MED ORDER — HEPARIN SOD (PORK) LOCK FLUSH 100 UNIT/ML IV SOLN
500.0000 [IU] | Freq: Once | INTRAVENOUS | Status: AC
  Administered 2023-11-07: 500 [IU] via INTRAVENOUS
  Filled 2023-11-07: qty 5

## 2023-11-07 MED ORDER — BLOOD GLUCOSE METER KIT
PACK | 0 refills | Status: AC
Start: 2023-11-07 — End: ?

## 2023-11-07 NOTE — Assessment & Plan Note (Signed)
Grade 2  Continue gabapentin 300mg  BID.

## 2023-11-07 NOTE — Assessment & Plan Note (Signed)
Chemotherapy plan as listed above 

## 2023-11-07 NOTE — Telephone Encounter (Signed)
Medication Refill -  Most Recent Primary Care Visit:  Provider: Alba Cory  Department: [facility]  Visit Type: OFFICE VISIT  Date: 10/09/2023  Medication: glucometer, test strips, lancets (whatever insurance will pay for)  Has the patient contacted their pharmacy?  No [facility] [address] [place], [place] - [address]   Pt cancer dr, Dr Dorna Bloom, pprescribed pt prednisone.  Which  Is this the correct pharmacy for this prescription? No If no, delete pharmacy and type the correct one.  This is the patient's preferred pharmacy:   Has the prescription been filled recently? No  Is the patient out of the medication? Yes  Has the patient been seen for an appointment in the last year OR does the patient have an upcoming appointment? Yes  Can we respond through MyChart? Yes  Agent: Please be advised that Rx refills may take up to 3 business days. We ask that you follow-up with your pharmacy.

## 2023-11-07 NOTE — Assessment & Plan Note (Signed)
Check UA and Urine culture. Hold chemo

## 2023-11-07 NOTE — Progress Notes (Signed)
Pt here for follow up. Pt reports that she has been having twinges on her sides and chest pain. Pt believed chest pain is related to anxiety

## 2023-11-07 NOTE — Progress Notes (Addendum)
Hematology/Oncology Progress note [phone] [phone]      CHIEF COMPLAINTS/REASON FOR VISIT:  Follow-up for rectal cancer treatments.  ASSESSMENT & PLAN:   Cancer Staging  Rectal cancer [facility]) Staging form: Colon and Rectum, AJCC 8th Edition - Pathologic stage from 05/03/2022: Stage IIIB (pT3, pN1b, cM0) - Signed by [name], MD on 05/03/2022 - Pathologic stage from 06/18/2023: Stage IVA (rpTX, pN0, pM1a) - Signed by [name], MD on 06/18/2023   Rectal cancer [facility]) History of Stage III. pT3 pN1b cM0, s/p APR. 05/2023 Stage IV current rectal adenocarcinoma Currently on adjuvant chemotherapy. S/p FOLFOX x 4 cycles S/p concurrent chemotherapy [Xeloda 1300 mg BID] with RT, and then another 4 cycles of adjuvant FOLFOX [dose reduced oxaliplatin and omit 5-FU bolus 05/2023 CT imaging indicates disease progression. liver biopsy pathology showed metastatic carcinoma--> FOLFIRI + Bevacizumab   [employer] NGS showed TP53 missense variant, APC frameshift, TCF7L2 Frameshift, FLT3 copy number gain., TMB 4.7, pMMR Wildtype KRAS/NRAS Labs are reviewed and discussed with patient. Hold treatment FOLFIRI + Bevacizumab due to dysuria Repeat CT   Encounter for antineoplastic chemotherapy Chemotherapy plan as listed above  Chemotherapy-induced neuropathy (HCC) Grade 2  Continue gabapentin 300mg  BID.   Chemotherapy induced diarrhea Recommend Imodium PRN as directed.   Dysuria Check UA and Urine culture. Hold chemo   Orders Placed This Encounter  Procedures   Urine Culture    Standing Status:   Future    Number of Occurrences:   1    Standing Expiration Date:   11/06/2024   CT CHEST ABDOMEN PELVIS W CONTRAST    Standing Status:   Future    Standing Expiration Date:   11/06/2024    Order Specific Question:   If indicated for the ordered procedure, I authorize the administration of contrast media per Radiology protocol    Answer:   Yes    Order Specific Question:   Does  the patient have a contrast media/X-ray dye allergy?    Answer:   No    Order Specific Question:   Is patient pregnant?    Answer:   No    Order Specific Question:   Preferred imaging location?    Answer:   [facility]    Order Specific Question:   If indicated for the ordered procedure, I authorize the administration of oral contrast media per Radiology protocol    Answer:   Yes   Urinalysis, Complete w Microscopic    Standing Status:   Future    Number of Occurrences:   1    Standing Expiration Date:   11/06/2024    Follow-up TBD  All questions were answered. The patient knows to call the clinic with any problems, questions or concerns.  [name], MD, PhD [facility] Hematology Oncology 11/07/2023      HISTORY OF PRESENTING ILLNESS:   April Luna is a  52 y.o.  female presents for treatment of rectal cancer Oncology History  Rectal cancer (HCC)  02/26/2022 Imaging   MRI PELVIS WITHOUT CONTRAST- By imaging, rectal cancer stage:  T1/T2, N0, Mx    03/02/2022 Imaging   CT CHEST AND ABDOMEN WITH CONTRAST 1. No convincing evidence of metastatic disease within the chest or abdomen. 2. Atrophic left kidney with multifocal renal scarring and cortical calcifications as well as nonobstructive renal stones measuring up to 5 mm. 3. Prominent left-sided predominant retroperitoneal lymph nodes measuring up to 8 mm near the level of the renal hilum, overall decreased in size  dating back to CT September 19, 2018 and favored reactive related to left renal inflammation. 4.  Aortic Atherosclerosis (ICD10-I70.0).   03/27/2022 Genetic Testing    [employer] CustomNext+RNA cancer panel found no pathogenic mutations.    04/21/2022 Initial Diagnosis   Rectal cancer - baseline CEA 3.6 -02/09/2022, patient had colonoscopy which showed renal mass 10 cm from anal verge.  5 mm polyp in ascending colon.  Removed and retrieved. Pathology showed rectal adenocarcinoma.  The polyp in the ascending colon  is a tubular adenoma.  MRI showed cT1/T2N0 disease  -04/21/2022, patient underwent robotic assisted ultralow anterior resection. Pathology showed moderately differentiated adenocarcinoma, 4.5 cm in maximal extent, with focal extension through muscularis propria into perirectal soft tissue.  3 lymph nodes positive for metastatic carcinoma.  Negative margin.  pT3 pN1b, MSI stable.   05/03/2022 Cancer Staging   Staging form: Colon and Rectum, AJCC 8th Edition - Pathologic stage from 05/03/2022: Stage IIIB (pT3, pN1b, cM0) - Signed by [name], MD on 05/03/2022 Stage prefix: Initial diagnosis   05/11/2022 Miscellaneous   Medi port placed by Dr.White   05/26/2022 -  Chemotherapy   FOLFOX Q2 weeks x 4   05/26/2022 - 07/09/2022 Chemotherapy   Patient is on Treatment Plan : COLORECTAL FOLFOX q14d x 8 cycles     05/26/2022 - 12/13/2022 Chemotherapy   Patient is on Treatment Plan : COLORECTAL FOLFOX q14d x 4 months     07/27/2022 -  Chemotherapy   Concurrent chemotherapy- Xeloda  1300mg  BID and radiation.    07/27/2022 - 09/12/2022 Radiation Therapy    concurrent chemotherapy [Xeloda 1300 mg BID] with RT    09/20/2022 Imaging   CT Angiogram chest PET protocol There is no evidence of pulmonary artery embolism. There is no evidence of thoracic aortic dissection.   Small linear patchy alveolar infiltrate is seen in medial segment of right middle lobe suggesting atelectasis/pneumonia.     09/28/2022 - 09/30/2022 Hospital Admission   Admission due to acute respiratory failure with hypoxia, COPD exacerbation   09/28/2022 Imaging   CT angio chest PE protocol 1. Negative for acute PE or thoracic aortic dissection. 2. New ground-glass infiltrates anteriorly in bilateral upper lobes,may represent atypical edema, infectious or inflammatory process. 3.  Aortic Atherosclerosis     12/22/2022 Imaging   CT chest abdomen pelvis w contrast  Stable exam. No evidence of recurrent or metastatic carcinoma within the  chest, abdomen, or pelvis.   Left nephrolithiasis and renal parenchymal atrophy. No evidence of ureteral calculi or hydronephrosis.   Aortic Atherosclerosis   05/31/2023 Imaging   CT chest abdomen pelvis with contrast showed 1. New hypodense 16 mm lesion in the right lobe of the liver with very subtle adjacent 5 mm lesion, suspicious for metastatic disease. 2. Small bilateral pulmonary nodules, some of which were subtly evident on prior examination only in retrospect, some of which demonstrate degree of cavitation, suspicious for metastatic disease. 3. Prominent retroperitoneal lymph nodes are similar prior. 4. Prior low anterior resection with Hartmann's pouch formation,similar small amount of perirectal/presacral soft tissue and fluid,favored postsurgical/posttreatment change. Suggest continued attention on follow-up imaging. 5. Large volume of formed stool in the colon. 6. Nonobstructive left renal calculi measure under 1 cm.     06/08/2023 Relapse/Recurrence   Ultrasound-guided liver biopsy showed Pathology showed metastatic carcinoma, morphology consistent with patient's clinical history of rectal adenocarcinoma.  [employer] NGS 648 gene panel showed TP53 missense variant, APC frameshift, TCF7L2 frameshift, FLT3 copy number gain, TMB 4.23m/MB MSI stable.  Wildtype KRAS/NRAS  [employer] RNA Seq - ERBB3 overexpressed, VEGFA overexpressed, NRAS overexpressed.     06/18/2023 Cancer Staging   Staging form: Colon and Rectum, AJCC 8th Edition - Pathologic stage from 06/18/2023: Stage IVA (rpTX, pN0, pM1a) - Signed by [name], MD on 06/18/2023 Stage prefix: Recurrence Total positive nodes: 0   06/25/2023 - 06/25/2023 Chemotherapy   Patient is on Treatment Plan : COLORECTAL FOLFOX + Bevacizumab q14d     07/02/2023 -  Chemotherapy   Patient is on Treatment Plan : COLORECTAL FOLFIRI + Bevacizumab q14d     Patient has bipolar and schizophrenia..  Patient is married  She has housing issue due to  previous history of eviction.  she does not live with her husband currently. She lives with some family members.   INTERVAL HISTORY [place] PENSABENE is a 52 y.o. female who has above history reviewed by me today presents for follow up visit for rectal cancer.  She feels well today.  Denies any melena or blood in the stool. Stable neuropathy symptoms. Diarrhea managed by anti diarrhea medication + she reports pain with urination, and urgency     Review of Systems  Constitutional:  Positive for fatigue. Negative for appetite change, chills and fever.  HENT:   Positive for mouth sores. Negative for hearing loss and voice change.   Eyes:  Negative for eye problems.  Respiratory:  Negative for chest tightness, cough and shortness of breath.   Cardiovascular:  Negative for chest pain.  Gastrointestinal:  Negative for abdominal distention, abdominal pain, blood in stool, diarrhea and nausea.       + colostomy  Endocrine: Negative for hot flashes.  Genitourinary:  Positive for dysuria and frequency. Negative for difficulty urinating.   Musculoskeletal:  Negative for arthralgias.  Skin:  Negative for itching and rash.  Neurological:  Positive for numbness. Negative for extremity weakness.  Hematological:  Negative for adenopathy.  Psychiatric/Behavioral:  Negative for confusion.     MEDICAL HISTORY:  Past Medical History:  Diagnosis Date   Allergy    pollen   Anxiety    Arthritis    right hip   Bipolar 1 disorder (HCC)    Cancer (HCC)    rectal   Chemotherapy-induced neuropathy (HCC) 10/24/2022   Chronic kidney disease    COPD (chronic obstructive pulmonary disease) (HCC)    Depression    Family history of adverse reaction to anesthesia    grand father had a stroke during anesthesia   Family history of breast cancer    Family history of colon cancer    Family history of uterine cancer    GERD (gastroesophageal reflux disease)    History of kidney stones    Hyperlipidemia     Hypertension    Hypothyroidism    Panic attack    Pneumonia    Psoriasis    Sleep apnea 08/11/2021   No CPAP   Type 2 diabetes mellitus with microalbuminuria, without long-term current use of insulin (HCC) 06/24/2019    SURGICAL HISTORY: Past Surgical History:  Procedure Laterality Date   BREAST BIOPSY Left 12/14/2021   [place] bx, venus marker, path pending   CESAREAN SECTION     COLONOSCOPY WITH PROPOFOL N/A 02/09/2022   Procedure: COLONOSCOPY WITH PROPOFOL;  Surgeon: Toney Reil, MD;  Location: [facility];  Service: Endoscopy;  Laterality: N/A;  sleep apnea   CYSTOSCOPY W/ RETROGRADES Left 11/08/2018   Procedure: CYSTOSCOPY WITH RETROGRADE PYELOGRAM;  Surgeon: Sondra Come, MD;  Location: [facility];  Service: Urology;  Laterality: Left;   CYSTOSCOPY/URETEROSCOPY/HOLMIUM LASER/STENT PLACEMENT Left 11/08/2018   Procedure: CYSTOSCOPY/URETEROSCOPY/HOLMIUM LASER/STENT PLACEMENT;  Surgeon: Sondra Come, MD;  Location: [facility];  Service: Urology;  Laterality: Left;   IR CV LINE INJECTION  06/25/2023   IR IMAGING GUIDED PORT INSERTION  05/11/2022   IR REMOVE CV FIBRIN SHEATH  06/27/2023   MOUTH SURGERY     wisdom teeth extraction   MOUTH SURGERY     teeth removal   POLYPECTOMY N/A 02/09/2022   Procedure: POLYPECTOMY;  Surgeon: Toney Reil, MD;  Location: [facility];  Service: Endoscopy;  Laterality: N/A;   XI ROBOTIC ASSISTED LOWER ANTERIOR RESECTION N/A 04/21/2022   Procedure: XI ROBOTIC ASSISTED LOWER ANTERIOR RESECTION WITH COLOSTOMY, BILATERAL TAP BLOCK, ASSESSMENT OF TISSUE PERFUSSION WITH FIREFLY INJECTION;  Surgeon: Andria Meuse, MD;  Location: [facility];  Service: General;  Laterality: N/A;    SOCIAL HISTORY: Social History   Socioeconomic History   Marital status: Married    Spouse name: Lesle Chris   Number of children: 1   Years of education: 12   Highest education level: High school graduate  Occupational History    Occupation: unemployed    Comment: disabled  Tobacco Use   Smoking status: Some Days    Current packs/day: 0.25    Average packs/day: 0.3 packs/day for 38.5 years (9.6 ttl pk-yrs)    Types: Cigarettes    Start date: 05/13/1985    Passive exposure: Current   Smokeless tobacco: Never   Tobacco comments:    5-6 cigarettes weekly- 11/07/2022  Vaping Use   Vaping status: Former   Start date: 05/25/2018   Quit date: 09/24/2018  Substance and Sexual Activity   Alcohol use: Not Currently    Alcohol/week: 4.0 standard drinks of alcohol    Types: 4 Glasses of wine per week    Comment: quit ETOH in Feb. 2023   Drug use: Yes    Types: Marijuana    Comment: 2 days ago   Sexual activity: Not Currently    Birth control/protection: Post-menopausal  Other Topics Concern   Not on file  Social History Narrative   Lives with husband and son    Social Determinants of Health   Financial Resource Strain: High Risk (08/30/2023)   Overall Financial Resource Strain (CARDIA)    Difficulty of Paying Living Expenses: Hard  Food Insecurity: Food Insecurity Present (08/30/2023)   Hunger Vital Sign    Worried About Running Out of Food in the Last Year: Sometimes true    Ran Out of Food in the Last Year: Sometimes true  Transportation Needs: No Transportation Needs (08/30/2023)   PRAPARE - Administrator, Civil Service (Medical): No    Lack of Transportation (Non-Medical): No  Physical Activity: Inactive (08/30/2023)   Exercise Vital Sign    Days of Exercise per Week: 0 days    Minutes of Exercise per Session: 0 min  Stress: Stress Concern Present (08/30/2023)   [employer] of Occupational Health - Occupational Stress Questionnaire    Feeling of Stress : Very much  Social Connections: Moderately Integrated (08/30/2023)   Social Connection and Isolation Panel [NHANES]    Frequency of Communication with Friends and Family: Never    Frequency of Social Gatherings with Friends and Family: Once a  week    Attends Religious Services: More than 4 times per year    Active Member of Clubs or Organizations: No  Attends Banker Meetings: 1 to 4 times per year    Marital Status: Married  Catering manager Violence: Not At Risk (08/30/2023)   Humiliation, Afraid, Rape, and Kick questionnaire    Fear of Current or Ex-Partner: No    Emotionally Abused: No    Physically Abused: No    Sexually Abused: No    FAMILY HISTORY: Family History  Problem Relation Age of Onset   Depression Mother    Anxiety disorder Mother    Diabetes Mother    Hypertension Mother    Hyperlipidemia Mother    Cancer Mother    Uterine cancer Mother 5   Cervical cancer Mother 46   Colon cancer Father    Depression Brother    Anxiety disorder Brother    Cancer Maternal Aunt        unk types   Diabetes Mellitus II Maternal Grandmother    Hypercholesterolemia Maternal Grandmother    Breast cancer Maternal Grandmother    Cancer Paternal Grandmother    Diabetes Paternal Grandmother    Melanoma Paternal Grandmother    Stomach cancer Paternal Grandmother     ALLERGIES:  is allergic to metformin and related, nsaids, perphenazine, sulfa antibiotics, abilify [aripiprazole], and penicillins.  MEDICATIONS:  Current Outpatient Medications  Medication Sig Dispense Refill   acetaminophen (TYLENOL) 325 MG tablet Take 650 mg by mouth every 6 (six) hours as needed for headache (pain).     albuterol (VENTOLIN HFA) 108 (90 Base) MCG/ACT inhaler Inhale 2 puffs into the lungs every 6 (six) hours as needed for wheezing or shortness of breath. 1 each 5   amantadine (SYMMETREL) 100 MG capsule Take 100 mg by mouth 2 (two) times daily.     Budeson-Glycopyrrol-Formoterol (BREZTRI AEROSPHERE) 160-9-4.8 MCG/ACT AERO Inhale 2 puffs into the lungs in the morning and at bedtime. 5.9 g 1   buPROPion (WELLBUTRIN XL) 300 MG 24 hr tablet Take 300 mg by mouth daily.     Cholecalciferol (VITAMIN D-3) 125 MCG (5000 UT) TABS Take  5,000 Units by mouth daily. 30 tablet 1   clonazePAM (KLONOPIN) 0.5 MG tablet Take 1 mg by mouth 2 (two) times daily as needed.     dapagliflozin propanediol (FARXIGA) 10 MG TABS tablet Take 1 tablet (10 mg total) by mouth daily before breakfast. 90 tablet 1   dexamethasone (DECADRON) 4 MG tablet Take 2 tablets (8 mg total) by mouth See admin instructions. Take 8mg  daily for 2 days after each chemotherapy. 30 tablet 1   diltiazem (CARDIZEM CD) 120 MG 24 hr capsule Take 1 capsule (120 mg total) by mouth daily. 90 capsule 1   gabapentin (NEURONTIN) 300 MG capsule Take 300 mg by mouth 2 (two) times daily.     ipratropium-albuterol (DUONEB) 0.5-2.5 (3) MG/3ML SOLN Take 3 mLs by nebulization every 6 (six) hours as needed. 360 mL 11   levothyroxine (SYNTHROID) 75 MCG tablet TAKE 1 TABLET BY MOUTH DAILY BEFORE BREAKFAST 90 tablet 2   lidocaine-prilocaine (EMLA) cream Apply to affected area once 30 g 3   loperamide (IMODIUM) 2 MG capsule Take 1 capsule (2 mg total) by mouth See admin instructions. Initial: 4 mg,the 2 mg every 2 hours (4 mg every 4 hours at night)  maximum: 16 mg/day 60 capsule 2   loratadine (CLARITIN) 10 MG tablet Take 10 mg by mouth every morning.     magic mouthwash w/lidocaine SOLN Take 5 mLs by mouth 4 (four) times daily as needed for mouth pain. Sig: Swish/Swallow  5-10 ml four times a day as needed. Dispense 480 ml. 1RF 480 mL 1   ondansetron (ZOFRAN) 8 MG tablet Take 1 tablet (8 mg total) by mouth every 8 (eight) hours as needed for nausea, vomiting or refractory nausea / vomiting. Start on the third day after chemotherapy. 30 tablet 1   oxybutynin (DITROPAN-XL) 10 MG 24 hr tablet Take 1 tablet (10 mg total) by mouth daily. 30 tablet 11   paliperidone (INVEGA SUSTENNA) 234 MG/1.5ML SUSY injection Inject 234 mg into the muscle every 30 (thirty) days. On or about the 14th of each month     pantoprazole (PROTONIX) 40 MG tablet TAKE 1 TABLET BY MOUTH DAILY 90 tablet 2   pioglitazone  (ACTOS) 15 MG tablet Take 1 tablet (15 mg total) by mouth daily. 90 tablet 1   promethazine (PHENERGAN) 12.5 MG tablet Take 1 tablet (12.5 mg total) by mouth every 6 (six) hours as needed for nausea or vomiting. 30 tablet 0   rosuvastatin (CRESTOR) 40 MG tablet TAKE 1 TABLET BY MOUTH EVERY MORNING 30 tablet 3   sertraline (ZOLOFT) 100 MG tablet Take 200 mg by mouth every morning.     Spacer/Aero-Holding Chambers (AEROCHAMBER MV) inhaler Use as instructed 1 each 0   VASCEPA 1 g capsule TAKE 2 CAPSULES BY MOUTH 2 TIMES DAILY 120 capsule 3   ACCU-CHEK GUIDE test strip USE UP TO FOUR TIMES DAILY AS DIRECTED. (Patient not taking: Reported on 11/07/2023) 100 each 5   blood glucose meter kit and supplies Dispense based on patient and insurance preference. Use up to four times daily as directed. (FOR ICD-10 E10.9, E11.9). 1 each 0   docusate sodium (COLACE) 100 MG capsule TAKE 1 CAPSULE BY MOUTH TWICE DAILY (Patient not taking: Reported on 11/07/2023) 60 capsule 1   hydrocortisone cream 0.5 % Apply 1 Application topically 3 (three) times daily. (Patient not taking: Reported on 11/07/2023) 30 g 1   Hydrocortisone, Perianal, 1 % CREA Apply topically. (Patient not taking: Reported on 10/24/2023)     Insulin Pen Needle 32G X 4 MM MISC 1 each by Does not apply route daily at 12 noon. (Patient not taking: Reported on 11/07/2023) 100 each 0   NON FORMULARY Pt uses a cpap nightly (Patient not taking: Reported on 11/07/2023)     nystatin cream (MYCOSTATIN) Apply 1 Application topically 2 (two) times daily. (Patient not taking: Reported on 11/07/2023) 30 g 0   Pediatric Multivit-Minerals-C (CHEWABLES MULTIVITAMIN PO) Take 1 tablet by mouth daily. (Patient not taking: Reported on 11/07/2023)     predniSONE (STERAPRED UNI-PAK 21 TAB) 10 MG (21) TBPK tablet Day 1 take 6 tablets, Day 2 take 5 tablets, Day 3 take 4 tablets, Day 4 take 3 tablets, Day 5 take 2 tablets D6 take 1 tablet (Patient not taking: Reported on  11/07/2023) 21 each 0   No current facility-administered medications for this visit.   Facility-Administered Medications Ordered in Other Visits  Medication Dose Route Frequency Provider Last Rate Last Admin   albuterol (PROVENTIL) (2.5 MG/3ML) 0.083% nebulizer solution 2.5 mg  2.5 mg Nebulization Once Raechel Chute, MD       sodium chloride flush (NS) 0.9 % injection 10 mL  10 mL Intracatheter PRN [name], MD   10 mL at 08/15/23 1327     PHYSICAL EXAMINATION: ECOG PERFORMANCE STATUS: 0 - Asymptomatic Vitals:   11/07/23 0936  BP: 107/83  Pulse: 76  Resp: 18  Temp: (!) 97.4 F (36.3 C)  SpO2:  92%    Filed Weights   11/07/23 0936  Weight: 142 lb 3.2 oz (64.5 kg)     Physical Exam Constitutional:      General: She is not in acute distress. HENT:     Head: Normocephalic and atraumatic.     Mouth/Throat:     Comments: mucositis Eyes:     General: No scleral icterus. Cardiovascular:     Rate and Rhythm: Normal rate and regular rhythm.  Pulmonary:     Effort: Pulmonary effort is normal. No respiratory distress.     Breath sounds: No wheezing.     Comments: Decreased breath sound bilaterally Abdominal:     General: Bowel sounds are normal. There is no distension.     Palpations: Abdomen is soft.     Comments: +colostomy   Musculoskeletal:        General: No deformity. Normal range of motion.     Cervical back: Normal range of motion and neck supple.  Skin:    General: Skin is warm and dry.     Findings: No erythema or rash.  Neurological:     Mental Status: She is alert and oriented to person, place, and time. Mental status is at baseline.     Cranial Nerves: No cranial nerve deficit.     Coordination: Coordination normal.  Psychiatric:        Mood and Affect: Mood normal.     LABORATORY DATA:  I have reviewed the data as listed    Latest Ref Rng & Units 11/07/2023    9:17 AM 10/24/2023    8:25 AM 09/17/2023    9:32 AM  CBC  WBC 4.0 - 10.5 K/uL 3.8  6.4   4.9   Hemoglobin 12.0 - 15.0 g/dL 30.8  65.7  84.6   Hematocrit 36.0 - 46.0 % 40.8  41.4  39.3   Platelets 150 - 400 K/uL 123  133  133       Latest Ref Rng & Units 11/07/2023    9:17 AM 10/24/2023    8:25 AM 09/17/2023    9:32 AM  CMP  Glucose 70 - 99 mg/dL 90  962  952   BUN 6 - 20 mg/dL 14  18  18    Creatinine 0.44 - 1.00 mg/dL 8.41  3.24  4.01   Sodium 135 - 145 mmol/L 136  136  138   Potassium 3.5 - 5.1 mmol/L 4.5  4.2  4.2   Chloride 98 - 111 mmol/L 101  102  102   CO2 22 - 32 mmol/L 27  26  26    Calcium 8.9 - 10.3 mg/dL 9.2  8.9  9.4   Total Protein 6.5 - 8.1 g/dL 6.9  7.1  7.0   Total Bilirubin <1.2 mg/dL 0.5  0.5  0.4   Alkaline Phos 38 - 126 U/L 72  81  96   AST 15 - 41 U/L 18  20  19    ALT 0 - 44 U/L 15  15  15          RADIOGRAPHIC STUDIES: I have personally reviewed the radiological images as listed and agreed with the findings in the report. No results found.

## 2023-11-07 NOTE — Assessment & Plan Note (Signed)
Recommend Imodium PRN as directed.

## 2023-11-07 NOTE — Assessment & Plan Note (Addendum)
History of Stage III. pT3 pN1b cM0, s/p APR. 05/2023 Stage IV current rectal adenocarcinoma Currently on adjuvant chemotherapy. S/p FOLFOX x 4 cycles S/p concurrent chemotherapy [Xeloda 1300 mg BID] with RT, and then another 4 cycles of adjuvant FOLFOX [dose reduced oxaliplatin and omit 5-FU bolus 05/2023 CT imaging indicates disease progression. liver biopsy pathology showed metastatic carcinoma--> FOLFIRI + Bevacizumab   [facility] NGS showed TP53 missense variant, APC frameshift, TCF7L2 Frameshift, FLT3 copy number gain., TMB 4.7, pMMR Wildtype KRAS/NRAS Labs are reviewed and discussed with patient. Hold treatment FOLFIRI + Bevacizumab due to dysuria Repeat CT

## 2023-11-08 LAB — CEA: CEA: 5.2 ng/mL — ABNORMAL HIGH (ref 0.0–4.7)

## 2023-11-09 ENCOUNTER — Encounter: Payer: Self-pay | Admitting: Oncology

## 2023-11-09 ENCOUNTER — Inpatient Hospital Stay: Payer: 59

## 2023-11-09 ENCOUNTER — Telehealth: Payer: Self-pay

## 2023-11-09 ENCOUNTER — Other Ambulatory Visit: Payer: Self-pay

## 2023-11-09 DIAGNOSIS — N390 Urinary tract infection, site not specified: Secondary | ICD-10-CM

## 2023-11-09 LAB — URINE CULTURE: Culture: >=100000 — AB

## 2023-11-09 MED ORDER — CIPROFLOXACIN HCL 500 MG PO TABS: 500.0000 mg | ORAL_TABLET | Freq: Two times a day (BID) | ORAL | 0 refills | Status: DC

## 2023-11-09 NOTE — Telephone Encounter (Signed)
-----   Message from [name] sent at 11/09/2023  8:35 AM EST ----- UTI, please advise her to start cipro. I will send RX .  Please refer to urology for recurrent UTI  I will adjust her treatment IS.

## 2023-11-09 NOTE — Telephone Encounter (Signed)
Called patient and informed her that her UA showed UTI and that Dr. Cathie Hoops sent in Rx for Cipro and that she also wants to refer her to urology for recurrent UTI. I entered referral for urology. Patient is aware and will pick up Cipro.

## 2023-11-09 NOTE — Addendum Note (Signed)
Addended by: [name] on: 11/09/2023 08:38 AM   Modules accepted: Orders

## 2023-11-11 ENCOUNTER — Encounter: Payer: Self-pay | Admitting: Nurse Practitioner

## 2023-11-11 ENCOUNTER — Encounter: Payer: Self-pay | Admitting: Oncology

## 2023-11-13 ENCOUNTER — Inpatient Hospital Stay: Payer: 59

## 2023-11-13 ENCOUNTER — Ambulatory Visit
Admission: RE | Admit: 2023-11-13 | Discharge: 2023-11-13 | Disposition: A | Discharge status: Home / Self Care | Payer: 59 | Source: Ambulatory Visit | Attending: Oncology | Admitting: Oncology

## 2023-11-13 DIAGNOSIS — C20 Malignant neoplasm of rectum: Secondary | ICD-10-CM | POA: Diagnosis not present

## 2023-11-13 DIAGNOSIS — K769 Liver disease, unspecified: Secondary | ICD-10-CM | POA: Diagnosis not present

## 2023-11-13 DIAGNOSIS — R911 Solitary pulmonary nodule: Secondary | ICD-10-CM | POA: Diagnosis not present

## 2023-11-13 DIAGNOSIS — N200 Calculus of kidney: Secondary | ICD-10-CM | POA: Diagnosis not present

## 2023-11-13 DIAGNOSIS — N281 Cyst of kidney, acquired: Secondary | ICD-10-CM | POA: Diagnosis not present

## 2023-11-13 MED ORDER — BARIUM SULFATE 2 % PO SUSP
450.0000 mL | ORAL | Status: AC
  Administered 2023-11-13 (×2): 450 mL via ORAL

## 2023-11-13 MED ORDER — IOHEXOL 300 MG/ML  SOLN
85.0000 mL | Freq: Once | INTRAMUSCULAR | Status: AC | PRN
  Administered 2023-11-13: 85 mL via INTRAVENOUS

## 2023-11-14 ENCOUNTER — Other Ambulatory Visit: Payer: Self-pay | Admitting: Oncology

## 2023-11-14 DIAGNOSIS — C20 Malignant neoplasm of rectum: Secondary | ICD-10-CM

## 2023-11-15 ENCOUNTER — Telehealth: Payer: Self-pay

## 2023-11-15 ENCOUNTER — Other Ambulatory Visit: Payer: Self-pay | Admitting: Oncology

## 2023-11-15 ENCOUNTER — Other Ambulatory Visit: Payer: Self-pay

## 2023-11-15 DIAGNOSIS — C20 Malignant neoplasm of rectum: Secondary | ICD-10-CM

## 2023-11-15 DIAGNOSIS — R309 Painful micturition, unspecified: Secondary | ICD-10-CM

## 2023-11-15 NOTE — Telephone Encounter (Signed)
-----   Message from [name] sent at 11/14/2023  9:06 PM EST ----- Please arrange her to repeat UA and urine culture around 11/25 Thanks.  I have adjusted chemo IS to 12/9 Thanks.   zy

## 2023-11-15 NOTE — Telephone Encounter (Signed)
Morrie Sheldon can you please arrange patient for lab encounter around 11/25 and notify patient. I have entered lab orders.

## 2023-11-19 ENCOUNTER — Inpatient Hospital Stay: Payer: 59

## 2023-11-19 DIAGNOSIS — C787 Secondary malignant neoplasm of liver and intrahepatic bile duct: Secondary | ICD-10-CM | POA: Diagnosis not present

## 2023-11-19 DIAGNOSIS — R300 Dysuria: Secondary | ICD-10-CM | POA: Diagnosis not present

## 2023-11-19 DIAGNOSIS — F17210 Nicotine dependence, cigarettes, uncomplicated: Secondary | ICD-10-CM | POA: Diagnosis not present

## 2023-11-19 DIAGNOSIS — Z923 Personal history of irradiation: Secondary | ICD-10-CM | POA: Diagnosis not present

## 2023-11-19 DIAGNOSIS — Z9221 Personal history of antineoplastic chemotherapy: Secondary | ICD-10-CM | POA: Diagnosis not present

## 2023-11-19 DIAGNOSIS — R309 Painful micturition, unspecified: Secondary | ICD-10-CM

## 2023-11-19 DIAGNOSIS — C20 Malignant neoplasm of rectum: Secondary | ICD-10-CM | POA: Diagnosis not present

## 2023-11-19 LAB — URINALYSIS, COMPLETE (UACMP) WITH MICROSCOPIC
Bacteria, UA: NONE SEEN
Bilirubin Urine: NEGATIVE
Glucose, UA: >=500 mg/dL — AB
Hgb urine dipstick: NEGATIVE
Ketones, ur: NEGATIVE mg/dL
Leukocytes,Ua: NEGATIVE
Nitrite: NEGATIVE
Protein, ur: NEGATIVE mg/dL
Specific Gravity, Urine: 1.013 (ref 1.005–1.030)
pH: 6 (ref 5.0–8.0)

## 2023-11-20 LAB — URINE CULTURE: Culture: NO GROWTH

## 2023-11-21 ENCOUNTER — Ambulatory Visit: Payer: 59

## 2023-11-21 ENCOUNTER — Ambulatory Visit: Payer: 59 | Admitting: Oncology

## 2023-11-21 ENCOUNTER — Other Ambulatory Visit: Payer: 59

## 2023-11-26 ENCOUNTER — Other Ambulatory Visit: Payer: 59

## 2023-11-26 ENCOUNTER — Ambulatory Visit: Payer: 59 | Admitting: Oncology

## 2023-11-26 ENCOUNTER — Ambulatory Visit: Payer: 59

## 2023-11-30 ENCOUNTER — Encounter: Payer: Self-pay | Admitting: Oncology

## 2023-12-02 DIAGNOSIS — C20 Malignant neoplasm of rectum: Secondary | ICD-10-CM | POA: Diagnosis not present

## 2023-12-03 ENCOUNTER — Inpatient Hospital Stay (HOSPITAL_BASED_OUTPATIENT_CLINIC_OR_DEPARTMENT_OTHER): Payer: 59 | Admitting: Oncology

## 2023-12-03 ENCOUNTER — Encounter: Payer: Self-pay | Admitting: Oncology

## 2023-12-03 ENCOUNTER — Telehealth: Payer: Self-pay

## 2023-12-03 ENCOUNTER — Inpatient Hospital Stay: Payer: 59

## 2023-12-03 ENCOUNTER — Inpatient Hospital Stay: Payer: 59 | Attending: Oncology

## 2023-12-03 VITALS — BP 123/63 | HR 105 | Temp 96.6°F | Resp 18 | Wt 143.8 lb

## 2023-12-03 VITALS — BP 135/75 | HR 88 | Temp 96.6°F | Resp 18

## 2023-12-03 DIAGNOSIS — F17210 Nicotine dependence, cigarettes, uncomplicated: Secondary | ICD-10-CM | POA: Insufficient documentation

## 2023-12-03 DIAGNOSIS — J441 Chronic obstructive pulmonary disease with (acute) exacerbation: Secondary | ICD-10-CM | POA: Diagnosis not present

## 2023-12-03 DIAGNOSIS — D122 Benign neoplasm of ascending colon: Secondary | ICD-10-CM | POA: Insufficient documentation

## 2023-12-03 DIAGNOSIS — R197 Diarrhea, unspecified: Secondary | ICD-10-CM | POA: Diagnosis not present

## 2023-12-03 DIAGNOSIS — K521 Toxic gastroenteritis and colitis: Secondary | ICD-10-CM

## 2023-12-03 DIAGNOSIS — T451X5A Adverse effect of antineoplastic and immunosuppressive drugs, initial encounter: Secondary | ICD-10-CM | POA: Insufficient documentation

## 2023-12-03 DIAGNOSIS — E1122 Type 2 diabetes mellitus with diabetic chronic kidney disease: Secondary | ICD-10-CM | POA: Insufficient documentation

## 2023-12-03 DIAGNOSIS — F319 Bipolar disorder, unspecified: Secondary | ICD-10-CM | POA: Diagnosis not present

## 2023-12-03 DIAGNOSIS — J9601 Acute respiratory failure with hypoxia: Secondary | ICD-10-CM | POA: Diagnosis not present

## 2023-12-03 DIAGNOSIS — C20 Malignant neoplasm of rectum: Secondary | ICD-10-CM

## 2023-12-03 DIAGNOSIS — E785 Hyperlipidemia, unspecified: Secondary | ICD-10-CM | POA: Insufficient documentation

## 2023-12-03 DIAGNOSIS — F209 Schizophrenia, unspecified: Secondary | ICD-10-CM | POA: Insufficient documentation

## 2023-12-03 DIAGNOSIS — J449 Chronic obstructive pulmonary disease, unspecified: Secondary | ICD-10-CM | POA: Diagnosis not present

## 2023-12-03 DIAGNOSIS — E039 Hypothyroidism, unspecified: Secondary | ICD-10-CM | POA: Insufficient documentation

## 2023-12-03 DIAGNOSIS — Z5111 Encounter for antineoplastic chemotherapy: Secondary | ICD-10-CM

## 2023-12-03 DIAGNOSIS — I700 Atherosclerosis of aorta: Secondary | ICD-10-CM | POA: Insufficient documentation

## 2023-12-03 DIAGNOSIS — Z79899 Other long term (current) drug therapy: Secondary | ICD-10-CM | POA: Insufficient documentation

## 2023-12-03 DIAGNOSIS — N200 Calculus of kidney: Secondary | ICD-10-CM | POA: Diagnosis not present

## 2023-12-03 DIAGNOSIS — Z933 Colostomy status: Secondary | ICD-10-CM | POA: Insufficient documentation

## 2023-12-03 DIAGNOSIS — R300 Dysuria: Secondary | ICD-10-CM

## 2023-12-03 DIAGNOSIS — Z803 Family history of malignant neoplasm of breast: Secondary | ICD-10-CM | POA: Insufficient documentation

## 2023-12-03 DIAGNOSIS — G620 Drug-induced polyneuropathy: Secondary | ICD-10-CM | POA: Diagnosis not present

## 2023-12-03 DIAGNOSIS — I10 Essential (primary) hypertension: Secondary | ICD-10-CM | POA: Insufficient documentation

## 2023-12-03 DIAGNOSIS — C787 Secondary malignant neoplasm of liver and intrahepatic bile duct: Secondary | ICD-10-CM | POA: Insufficient documentation

## 2023-12-03 DIAGNOSIS — Z87442 Personal history of urinary calculi: Secondary | ICD-10-CM | POA: Diagnosis not present

## 2023-12-03 DIAGNOSIS — E1140 Type 2 diabetes mellitus with diabetic neuropathy, unspecified: Secondary | ICD-10-CM | POA: Diagnosis not present

## 2023-12-03 DIAGNOSIS — Z794 Long term (current) use of insulin: Secondary | ICD-10-CM | POA: Insufficient documentation

## 2023-12-03 DIAGNOSIS — G4730 Sleep apnea, unspecified: Secondary | ICD-10-CM | POA: Insufficient documentation

## 2023-12-03 DIAGNOSIS — Z800 Family history of malignant neoplasm of digestive organs: Secondary | ICD-10-CM | POA: Insufficient documentation

## 2023-12-03 DIAGNOSIS — K219 Gastro-esophageal reflux disease without esophagitis: Secondary | ICD-10-CM | POA: Diagnosis not present

## 2023-12-03 LAB — CBC WITH DIFFERENTIAL (CANCER CENTER ONLY)
Abs Immature Granulocytes: 0.04 10*3/uL (ref 0.00–0.07)
Basophils Absolute: 0 10*3/uL (ref 0.0–0.1)
Basophils Relative: 0 %
Eosinophils Absolute: 0.1 10*3/uL (ref 0.0–0.5)
Eosinophils Relative: 1 %
HCT: 42.9 % (ref 36.0–46.0)
Hemoglobin: 14.3 g/dL (ref 12.0–15.0)
Immature Granulocytes: 0 %
Lymphocytes Relative: 8 %
Lymphs Abs: 0.9 10*3/uL (ref 0.7–4.0)
MCH: 32.8 pg (ref 26.0–34.0)
MCHC: 33.3 g/dL (ref 30.0–36.0)
MCV: 98.4 fL (ref 80.0–100.0)
Monocytes Absolute: 0.9 10*3/uL (ref 0.1–1.0)
Monocytes Relative: 9 %
Neutro Abs: 8.3 10*3/uL — ABNORMAL HIGH (ref 1.7–7.7)
Neutrophils Relative %: 82 %
Platelet Count: 132 10*3/uL — ABNORMAL LOW (ref 150–400)
RBC: 4.36 MIL/uL (ref 3.87–5.11)
RDW: 14.7 % (ref 11.5–15.5)
WBC Count: 10.2 10*3/uL (ref 4.0–10.5)
nRBC: 0 % (ref 0.0–0.2)

## 2023-12-03 LAB — CMP (CANCER CENTER ONLY)
ALT: 15 U/L (ref 0–44)
AST: 15 U/L (ref 15–41)
Albumin: 4 g/dL (ref 3.5–5.0)
Alkaline Phosphatase: 69 U/L (ref 38–126)
Anion gap: 9 (ref 5–15)
BUN: 19 mg/dL (ref 6–20)
CO2: 25 mmol/L (ref 22–32)
Calcium: 8.8 mg/dL — ABNORMAL LOW (ref 8.9–10.3)
Chloride: 101 mmol/L (ref 98–111)
Creatinine: 0.87 mg/dL (ref 0.44–1.00)
GFR, Estimated: >60 mL/min (ref >60)
Glucose, Bld: 134 mg/dL — ABNORMAL HIGH (ref 70–99)
Potassium: 3.6 mmol/L (ref 3.5–5.1)
Sodium: 135 mmol/L (ref 135–145)
Total Bilirubin: 0.5 mg/dL (ref <1.2)
Total Protein: 7.2 g/dL (ref 6.5–8.1)

## 2023-12-03 LAB — PROTEIN, URINE, RANDOM: Total Protein, Urine: 7 mg/dL

## 2023-12-03 MED ORDER — FLUOROURACIL CHEMO INJECTION 2.5 GM/50ML
400.0000 mg/m2 | Freq: Once | INTRAVENOUS | Status: AC
  Administered 2023-12-03: 650 mg via INTRAVENOUS
  Filled 2023-12-03: qty 13

## 2023-12-03 MED ORDER — BEVACIZUMAB-AWWB CHEMO INJECTION 400 MG/16ML
5.0000 mg/kg | Freq: Once | INTRAVENOUS | Status: AC
  Administered 2023-12-03: 300 mg via INTRAVENOUS
  Filled 2023-12-03: qty 12

## 2023-12-03 MED ORDER — ATROPINE SULFATE 1 MG/ML IV SOLN
0.5000 mg | Freq: Once | INTRAVENOUS | Status: AC | PRN
  Administered 2023-12-03: 0.5 mg via INTRAVENOUS
  Filled 2023-12-03: qty 1

## 2023-12-03 MED ORDER — FLUOROURACIL CHEMO INJECTION 5 GM/100ML
2400.0000 mg/m2 | INTRAVENOUS | Status: DC
  Administered 2023-12-03: 3850 mg via INTRAVENOUS
  Filled 2023-12-03: qty 77

## 2023-12-03 MED ORDER — SODIUM CHLORIDE 0.9 % IV SOLN
400.0000 mg/m2 | Freq: Once | INTRAVENOUS | Status: AC
  Administered 2023-12-03: 644 mg via INTRAVENOUS
  Filled 2023-12-03: qty 32.2

## 2023-12-03 MED ORDER — DEXAMETHASONE SODIUM PHOSPHATE 10 MG/ML IJ SOLN
10.0000 mg | Freq: Once | INTRAMUSCULAR | Status: AC
Start: 2023-12-03 — End: 2023-12-03
  Administered 2023-12-03: 10 mg via INTRAVENOUS
  Filled 2023-12-03: qty 1

## 2023-12-03 MED ORDER — SODIUM CHLORIDE 0.9 % IV SOLN
Freq: Once | INTRAVENOUS | Status: AC
  Filled 2023-12-03: qty 250

## 2023-12-03 MED ORDER — IRINOTECAN HCL CHEMO INJECTION 100 MG/5ML
180.0000 mg/m2 | Freq: Once | INTRAVENOUS | Status: AC
  Administered 2023-12-03: 300 mg via INTRAVENOUS
  Filled 2023-12-03: qty 15

## 2023-12-03 MED ORDER — PALONOSETRON HCL INJECTION 0.25 MG/5ML
0.2500 mg | Freq: Once | INTRAVENOUS | Status: AC
  Administered 2023-12-03: 0.25 mg via INTRAVENOUS
  Filled 2023-12-03: qty 5

## 2023-12-03 NOTE — Assessment & Plan Note (Signed)
Urine culture was negative.  She finished a course of empiric antibiotics. Refer to urology for further evaluation.

## 2023-12-03 NOTE — Assessment & Plan Note (Signed)
Recommend Imodium PRN as directed.

## 2023-12-03 NOTE — Assessment & Plan Note (Addendum)
History of Stage III. pT3 pN1b cM0, s/p APR. 05/2023 Stage IV current rectal adenocarcinoma Currently on adjuvant chemotherapy. S/p FOLFOX x 4 cycles S/p concurrent chemotherapy [Xeloda 1300 mg BID] with RT, and then another 4 cycles of adjuvant FOLFOX [dose reduced oxaliplatin and omit 5-FU bolus 05/2023 CT imaging indicates disease progression. liver biopsy pathology showed metastatic carcinoma--> FOLFIRI + Bevacizumab   [employer] NGS showed TP53 missense variant, APC frameshift, TCF7L2 Frameshift, FLT3 copy number gain., TMB 4.7, pMMR Wildtype KRAS/NRAS Labs are reviewed and discussed with patient. Proceed with FOLFIRI + Bevacizumab  Repeat CT showed treatment response.

## 2023-12-03 NOTE — Assessment & Plan Note (Signed)
Grade 2  Continue gabapentin 300mg  BID.

## 2023-12-03 NOTE — Assessment & Plan Note (Signed)
Chemotherapy plan as listed above 

## 2023-12-03 NOTE — Progress Notes (Signed)
1.  Recent Hematology/Oncology Progress note [phone] [phone]      CHIEF COMPLAINTS/REASON FOR VISIT:  Follow-up for rectal cancer treatments.  ASSESSMENT & PLAN:   Cancer Staging  Rectal cancer [facility]) Staging form: Colon and Rectum, AJCC 8th Edition - Pathologic stage from 05/03/2022: Stage IIIB (pT3, pN1b, cM0) - Signed by [name], MD on 05/03/2022 - Pathologic stage from 06/18/2023: Stage IVA (rpTX, pN0, pM1a) - Signed by [name], MD on 06/18/2023   Rectal cancer [facility]) History of Stage III. pT3 pN1b cM0, s/p APR. 05/2023 Stage IV current rectal adenocarcinoma Currently on adjuvant chemotherapy. S/p FOLFOX x 4 cycles S/p concurrent chemotherapy [Xeloda 1300 mg BID] with RT, and then another 4 cycles of adjuvant FOLFOX [dose reduced oxaliplatin and omit 5-FU bolus 05/2023 CT imaging indicates disease progression. liver biopsy pathology showed metastatic carcinoma--> FOLFIRI + Bevacizumab   [employer] NGS showed TP53 missense variant, APC frameshift, TCF7L2 Frameshift, FLT3 copy number gain., TMB 4.7, pMMR Wildtype KRAS/NRAS Labs are reviewed and discussed with patient. Proceed with FOLFIRI + Bevacizumab  Repeat CT showed treatment response.   Chemotherapy induced diarrhea Recommend Imodium PRN as directed.   Chemotherapy-induced neuropathy (HCC) Grade 2  Continue gabapentin 300mg  BID.   Encounter for antineoplastic chemotherapy Chemotherapy plan as listed above  Dysuria Urine culture was negative.  She finished a course of empiric antibiotics. Refer to urology for further evaluation.   No orders of the defined types were placed in this encounter.   Follow-up  3 weeks per patient's preference.   All questions were answered. The patient knows to call the clinic with any problems, questions or concerns.  [name], MD, PhD [facility] Hematology Oncology 12/03/2023      HISTORY OF PRESENTING ILLNESS:   April Luna is a  52 y.o.   female presents for treatment of rectal cancer Oncology History  Rectal cancer (HCC)  02/26/2022 Imaging   MRI PELVIS WITHOUT CONTRAST- By imaging, rectal cancer stage:  T1/T2, N0, Mx    03/02/2022 Imaging   CT CHEST AND ABDOMEN WITH CONTRAST 1. No convincing evidence of metastatic disease within the chest or abdomen. 2. Atrophic left kidney with multifocal renal scarring and cortical calcifications as well as nonobstructive renal stones measuring up to 5 mm. 3. Prominent left-sided predominant retroperitoneal lymph nodes measuring up to 8 mm near the level of the renal hilum, overall decreased in size dating back to CT September 19, 2018 and favored reactive related to left renal inflammation. 4.  Aortic Atherosclerosis (ICD10-I70.0).   03/27/2022 Genetic Testing    [facility] CustomNext+RNA cancer panel found no pathogenic mutations.    04/21/2022 Initial Diagnosis   Rectal cancer - baseline CEA 3.6 -02/09/2022, patient had colonoscopy which showed renal mass 10 cm from anal verge.  5 mm polyp in ascending colon.  Removed and retrieved. Pathology showed rectal adenocarcinoma.  The polyp in the ascending colon is a tubular adenoma.  MRI showed cT1/T2N0 disease  -04/21/2022, patient underwent robotic assisted ultralow anterior resection. Pathology showed moderately differentiated adenocarcinoma, 4.5 cm in maximal extent, with focal extension through muscularis propria into perirectal soft tissue.  3 lymph nodes positive for metastatic carcinoma.  Negative margin.  pT3 pN1b, MSI stable.   05/03/2022 Cancer Staging   Staging form: Colon and Rectum, AJCC 8th Edition - Pathologic stage from 05/03/2022: Stage IIIB (pT3, pN1b, cM0) - Signed by [name], MD on 05/03/2022 Stage prefix: Initial diagnosis   05/11/2022 Miscellaneous   Medi port placed by  Dr.White   05/26/2022 -  Chemotherapy   FOLFOX Q2 weeks x 4   05/26/2022 - 07/09/2022 Chemotherapy   Patient is on Treatment Plan : COLORECTAL FOLFOX q14d x 8  cycles     05/26/2022 - 12/13/2022 Chemotherapy   Patient is on Treatment Plan : COLORECTAL FOLFOX q14d x 4 months     07/27/2022 -  Chemotherapy   Concurrent chemotherapy- Xeloda  1300mg  BID and radiation.    07/27/2022 - 09/12/2022 Radiation Therapy    concurrent chemotherapy [Xeloda 1300 mg BID] with RT    09/20/2022 Imaging   CT Angiogram chest PET protocol There is no evidence of pulmonary artery embolism. There is no evidence of thoracic aortic dissection.   Small linear patchy alveolar infiltrate is seen in medial segment of right middle lobe suggesting atelectasis/pneumonia.     09/28/2022 - 09/30/2022 Hospital Admission   Admission due to acute respiratory failure with hypoxia, COPD exacerbation   09/28/2022 Imaging   CT angio chest PE protocol 1. Negative for acute PE or thoracic aortic dissection. 2. New ground-glass infiltrates anteriorly in bilateral upper lobes,may represent atypical edema, infectious or inflammatory process. 3.  Aortic Atherosclerosis     12/22/2022 Imaging   CT chest abdomen pelvis w contrast  Stable exam. No evidence of recurrent or metastatic carcinoma within the chest, abdomen, or pelvis.   Left nephrolithiasis and renal parenchymal atrophy. No evidence of ureteral calculi or hydronephrosis.   Aortic Atherosclerosis   05/31/2023 Imaging   CT chest abdomen pelvis with contrast showed 1. New hypodense 16 mm lesion in the right lobe of the liver with very subtle adjacent 5 mm lesion, suspicious for metastatic disease. 2. Small bilateral pulmonary nodules, some of which were subtly evident on prior examination only in retrospect, some of which demonstrate degree of cavitation, suspicious for metastatic disease. 3. Prominent retroperitoneal lymph nodes are similar prior. 4. Prior low anterior resection with Hartmann's pouch formation,similar small amount of perirectal/presacral soft tissue and fluid,favored postsurgical/posttreatment change. Suggest  continued attention on follow-up imaging. 5. Large volume of formed stool in the colon. 6. Nonobstructive left renal calculi measure under 1 cm.     06/08/2023 Relapse/Recurrence   Ultrasound-guided liver biopsy showed Pathology showed metastatic carcinoma, morphology consistent with patient's clinical history of rectal adenocarcinoma.  [employer] NGS 648 gene panel showed TP53 missense variant, APC frameshift, TCF7L2 frameshift, FLT3 copy number gain, TMB 4.31m/MB MSI stable.  Wildtype KRAS/NRAS  [employer] RNA Seq - ERBB3 overexpressed, VEGFA overexpressed, NRAS overexpressed.     06/18/2023 Cancer Staging   Staging form: Colon and Rectum, AJCC 8th Edition - Pathologic stage from 06/18/2023: Stage IVA (rpTX, pN0, pM1a) - Signed by [name], MD on 06/18/2023 Stage prefix: Recurrence Total positive nodes: 0   06/25/2023 - 06/25/2023 Chemotherapy   Patient is on Treatment Plan : COLORECTAL FOLFOX + Bevacizumab q14d     07/02/2023 -  Chemotherapy   Patient is on Treatment Plan : COLORECTAL FOLFIRI + Bevacizumab q14d     Patient has bipolar and schizophrenia..  Patient is married  She has housing issue due to previous history of eviction.  she does not live with her husband currently. She lives with some family members.   INTERVAL HISTORY SAMEERAH HARROP is a 52 y.o. female who has above history reviewed by me today presents for follow up visit for rectal cancer.  She feels well today.  Denies any melena or blood in the stool. Stable neuropathy symptoms. Diarrhea managed by anti diarrhea medication +  Patient finished a course of empiric antibiotics for presumed UTI.  Urine culture came back negative.  She continues to have chronic dysuria and urgency     Review of Systems  Constitutional:  Positive for fatigue. Negative for appetite change, chills and fever.  HENT:   Positive for mouth sores. Negative for hearing loss and voice change.   Eyes:  Negative for eye problems.  Respiratory:   Negative for chest tightness, cough and shortness of breath.   Cardiovascular:  Negative for chest pain.  Gastrointestinal:  Negative for abdominal distention, abdominal pain, blood in stool, diarrhea and nausea.       + colostomy  Endocrine: Negative for hot flashes.  Genitourinary:  Positive for dysuria and frequency. Negative for difficulty urinating.   Musculoskeletal:  Negative for arthralgias.  Skin:  Negative for itching and rash.  Neurological:  Positive for numbness. Negative for extremity weakness.  Hematological:  Negative for adenopathy.  Psychiatric/Behavioral:  Negative for confusion.     MEDICAL HISTORY:  Past Medical History:  Diagnosis Date   Allergy    pollen   Anxiety    Arthritis    right hip   Bipolar 1 disorder (HCC)    Cancer (HCC)    rectal   Chemotherapy-induced neuropathy (HCC) 10/24/2022   Chronic kidney disease    COPD (chronic obstructive pulmonary disease) (HCC)    Depression    Family history of adverse reaction to anesthesia    grand father had a stroke during anesthesia   Family history of breast cancer    Family history of colon cancer    Family history of uterine cancer    GERD (gastroesophageal reflux disease)    History of kidney stones    Hyperlipidemia    Hypertension    Hypothyroidism    Panic attack    Pneumonia    Psoriasis    Sleep apnea 08/11/2021   No CPAP   Type 2 diabetes mellitus with microalbuminuria, without long-term current use of insulin (HCC) 06/24/2019    SURGICAL HISTORY: Past Surgical History:  Procedure Laterality Date   BREAST BIOPSY Left 12/14/2021   [place] bx, venus marker, path pending   CESAREAN SECTION     COLONOSCOPY WITH PROPOFOL N/A 02/09/2022   Procedure: COLONOSCOPY WITH PROPOFOL;  Surgeon: Toney Reil, MD;  Location: [facility];  Service: Endoscopy;  Laterality: N/A;  sleep apnea   CYSTOSCOPY W/ RETROGRADES Left 11/08/2018   Procedure: CYSTOSCOPY WITH RETROGRADE PYELOGRAM;   Surgeon: Sondra Come, MD;  Location: [facility];  Service: Urology;  Laterality: Left;   CYSTOSCOPY/URETEROSCOPY/HOLMIUM LASER/STENT PLACEMENT Left 11/08/2018   Procedure: CYSTOSCOPY/URETEROSCOPY/HOLMIUM LASER/STENT PLACEMENT;  Surgeon: Sondra Come, MD;  Location: [facility];  Service: Urology;  Laterality: Left;   IR CV LINE INJECTION  06/25/2023   IR IMAGING GUIDED PORT INSERTION  05/11/2022   IR REMOVE CV FIBRIN SHEATH  06/27/2023   MOUTH SURGERY     wisdom teeth extraction   MOUTH SURGERY     teeth removal   POLYPECTOMY N/A 02/09/2022   Procedure: POLYPECTOMY;  Surgeon: Toney Reil, MD;  Location: [facility];  Service: Endoscopy;  Laterality: N/A;   XI ROBOTIC ASSISTED LOWER ANTERIOR RESECTION N/A 04/21/2022   Procedure: XI ROBOTIC ASSISTED LOWER ANTERIOR RESECTION WITH COLOSTOMY, BILATERAL TAP BLOCK, ASSESSMENT OF TISSUE PERFUSSION WITH FIREFLY INJECTION;  Surgeon: Andria Meuse, MD;  Location: [facility];  Service: General;  Laterality: N/A;    SOCIAL HISTORY: Social History  Socioeconomic History   Marital status: Married    Spouse name: Lesle Chris   Number of children: 1   Years of education: 12   Highest education level: High school graduate  Occupational History   Occupation: unemployed    Comment: disabled  Tobacco Use   Smoking status: Some Days    Current packs/day: 0.25    Average packs/day: 0.3 packs/day for 38.6 years (9.6 ttl pk-yrs)    Types: Cigarettes    Start date: 05/13/1985    Passive exposure: Current   Smokeless tobacco: Never   Tobacco comments:    5-6 cigarettes weekly- 11/07/2022  Vaping Use   Vaping status: Former   Start date: 05/25/2018   Quit date: 09/24/2018  Substance and Sexual Activity   Alcohol use: Not Currently    Alcohol/week: 4.0 standard drinks of alcohol    Types: 4 Glasses of wine per week    Comment: quit ETOH in Feb. 2023   Drug use: Yes    Types: Marijuana    Comment: 2 days ago   Sexual activity:  Not Currently    Birth control/protection: Post-menopausal  Other Topics Concern   Not on file  Social History Narrative   Lives with husband and son    Social Determinants of Health   Financial Resource Strain: High Risk (08/30/2023)   Overall Financial Resource Strain (CARDIA)    Difficulty of Paying Living Expenses: Hard  Food Insecurity: Food Insecurity Present (08/30/2023)   Hunger Vital Sign    Worried About Running Out of Food in the Last Year: Sometimes true    Ran Out of Food in the Last Year: Sometimes true  Transportation Needs: No Transportation Needs (08/30/2023)   PRAPARE - Administrator, Civil Service (Medical): No    Lack of Transportation (Non-Medical): No  Physical Activity: Inactive (08/30/2023)   Exercise Vital Sign    Days of Exercise per Week: 0 days    Minutes of Exercise per Session: 0 min  Stress: Stress Concern Present (08/30/2023)   [employer] of Occupational Health - Occupational Stress Questionnaire    Feeling of Stress : Very much  Social Connections: Moderately Integrated (08/30/2023)   Social Connection and Isolation Panel [NHANES]    Frequency of Communication with Friends and Family: Never    Frequency of Social Gatherings with Friends and Family: Once a week    Attends Religious Services: More than 4 times per year    Active Member of [employer] or Organizations: No    Attends Engineer, structural: 1 to 4 times per year    Marital Status: Married  Catering manager Violence: Not At Risk (08/30/2023)   Humiliation, Afraid, Rape, and Kick questionnaire    Fear of Current or Ex-Partner: No    Emotionally Abused: No    Physically Abused: No    Sexually Abused: No    FAMILY HISTORY: Family History  Problem Relation Age of Onset   Depression Mother    Anxiety disorder Mother    Diabetes Mother    Hypertension Mother    Hyperlipidemia Mother    Cancer Mother    Uterine cancer Mother 68   Cervical cancer Mother 1   Colon cancer  Father    Depression Brother    Anxiety disorder Brother    Cancer Maternal Aunt        unk types   Diabetes Mellitus II Maternal Grandmother    Hypercholesterolemia Maternal Grandmother    Breast cancer  Maternal Grandmother    Cancer Paternal Grandmother    Diabetes Paternal Grandmother    Melanoma Paternal Grandmother    Stomach cancer Paternal Grandmother     ALLERGIES:  is allergic to metformin and related, nsaids, perphenazine, sulfa antibiotics, abilify [aripiprazole], and penicillins.  MEDICATIONS:  Current Outpatient Medications  Medication Sig Dispense Refill   acetaminophen (TYLENOL) 325 MG tablet Take 650 mg by mouth every 6 (six) hours as needed for headache (pain).     albuterol (VENTOLIN HFA) 108 (90 Base) MCG/ACT inhaler Inhale 2 puffs into the lungs every 6 (six) hours as needed for wheezing or shortness of breath. 1 each 5   amantadine (SYMMETREL) 100 MG capsule Take 100 mg by mouth 2 (two) times daily.     blood glucose meter kit and supplies Dispense based on patient and insurance preference. Use up to four times daily as directed. (FOR ICD-10 E10.9, E11.9). 1 each 0   Budeson-Glycopyrrol-Formoterol (BREZTRI AEROSPHERE) 160-9-4.8 MCG/ACT AERO Inhale 2 puffs into the lungs in the morning and at bedtime. 5.9 g 1   buPROPion (WELLBUTRIN XL) 300 MG 24 hr tablet Take 300 mg by mouth daily.     Cholecalciferol (VITAMIN D-3) 125 MCG (5000 UT) TABS Take 5,000 Units by mouth daily. 30 tablet 1   clonazePAM (KLONOPIN) 0.5 MG tablet Take 1 mg by mouth 2 (two) times daily as needed.     dapagliflozin propanediol (FARXIGA) 10 MG TABS tablet Take 1 tablet (10 mg total) by mouth daily before breakfast. 90 tablet 1   dexamethasone (DECADRON) 4 MG tablet Take 2 tablets (8 mg total) by mouth See admin instructions. Take 8mg  daily for 2 days after each chemotherapy. 30 tablet 1   diltiazem (CARDIZEM CD) 120 MG 24 hr capsule TAKE 1 CAPSULE BY MOUTH DAILY 90 capsule 1   docusate sodium  (COLACE) 100 MG capsule TAKE 1 CAPSULE BY MOUTH TWICE DAILY 60 capsule 1   gabapentin (NEURONTIN) 300 MG capsule Take 300 mg by mouth 2 (two) times daily.     icosapent Ethyl (VASCEPA) 1 g capsule TAKE 2 CAPSULES BY MOUTH 2 TIMES DAILY 120 capsule 3   ipratropium-albuterol (DUONEB) 0.5-2.5 (3) MG/3ML SOLN Take 3 mLs by nebulization every 6 (six) hours as needed. 360 mL 11   levothyroxine (SYNTHROID) 75 MCG tablet TAKE 1 TABLET BY MOUTH DAILY BEFORE BREAKFAST 90 tablet 2   lidocaine-prilocaine (EMLA) cream Apply to affected area once 30 g 3   loperamide (IMODIUM) 2 MG capsule Take 1 capsule (2 mg total) by mouth See admin instructions. Initial: 4 mg,the 2 mg every 2 hours (4 mg every 4 hours at night)  maximum: 16 mg/day 60 capsule 2   loratadine (CLARITIN) 10 MG tablet Take 10 mg by mouth every morning.     magic mouthwash w/lidocaine SOLN Take 5 mLs by mouth 4 (four) times daily as needed for mouth pain. Sig: Swish/Swallow 5-10 ml four times a day as needed. Dispense 480 ml. 1RF 480 mL 1   ondansetron (ZOFRAN) 8 MG tablet Take 1 tablet (8 mg total) by mouth every 8 (eight) hours as needed for nausea, vomiting or refractory nausea / vomiting. Start on the third day after chemotherapy. 30 tablet 1   oxybutynin (DITROPAN-XL) 10 MG 24 hr tablet Take 1 tablet (10 mg total) by mouth daily. 30 tablet 11   paliperidone (INVEGA SUSTENNA) 234 MG/1.5ML SUSY injection Inject 234 mg into the muscle every 30 (thirty) days. On or about the 14th of each  month     pantoprazole (PROTONIX) 40 MG tablet TAKE 1 TABLET BY MOUTH DAILY 90 tablet 2   pioglitazone (ACTOS) 15 MG tablet Take 1 tablet (15 mg total) by mouth daily. 90 tablet 1   promethazine (PHENERGAN) 12.5 MG tablet Take 1 tablet (12.5 mg total) by mouth every 6 (six) hours as needed for nausea or vomiting. 30 tablet 0   rosuvastatin (CRESTOR) 40 MG tablet TAKE 1 TABLET BY MOUTH EVERY MORNING 30 tablet 3   sertraline (ZOLOFT) 100 MG tablet Take 200 mg by mouth  every morning.     Spacer/Aero-Holding Chambers (AEROCHAMBER MV) inhaler Use as instructed 1 each 0   ACCU-CHEK GUIDE test strip USE UP TO FOUR TIMES DAILY AS DIRECTED. (Patient not taking: Reported on 11/07/2023) 100 each 5   ciprofloxacin (CIPRO) 500 MG tablet Take 1 tablet (500 mg total) by mouth 2 (two) times daily. (Patient not taking: Reported on 12/03/2023) 14 tablet 0   hydrocortisone cream 0.5 % Apply 1 Application topically 3 (three) times daily. (Patient not taking: Reported on 11/07/2023) 30 g 1   Hydrocortisone, Perianal, 1 % CREA Apply topically. (Patient not taking: Reported on 10/24/2023)     Insulin Pen Needle 32G X 4 MM MISC 1 each by Does not apply route daily at 12 noon. (Patient not taking: Reported on 11/07/2023) 100 each 0   NON FORMULARY Pt uses a cpap nightly (Patient not taking: Reported on 11/07/2023)     nystatin cream (MYCOSTATIN) Apply 1 Application topically 2 (two) times daily. (Patient not taking: Reported on 11/07/2023) 30 g 0   Pediatric Multivit-Minerals-C (CHEWABLES MULTIVITAMIN PO) Take 1 tablet by mouth daily. (Patient not taking: Reported on 11/07/2023)     predniSONE (STERAPRED UNI-PAK 21 TAB) 10 MG (21) TBPK tablet Day 1 take 6 tablets, Day 2 take 5 tablets, Day 3 take 4 tablets, Day 4 take 3 tablets, Day 5 take 2 tablets D6 take 1 tablet (Patient not taking: Reported on 11/07/2023) 21 each 0   No current facility-administered medications for this visit.   Facility-Administered Medications Ordered in Other Visits  Medication Dose Route Frequency Provider Last Rate Last Admin   albuterol (PROVENTIL) (2.5 MG/3ML) 0.083% nebulizer solution 2.5 mg  2.5 mg Nebulization Once Raechel Chute, MD       fluorouracil (ADRUCIL) 3,850 mg in sodium chloride 0.9 % 73 mL chemo infusion  2,400 mg/m2 (Treatment Plan Recorded) Intravenous 1 day or 1 dose [name], MD   Infusion Verify at 12/03/23 1433   sodium chloride flush (NS) 0.9 % injection 10 mL  10 mL Intracatheter PRN  [name], MD   10 mL at 08/15/23 1327     PHYSICAL EXAMINATION: ECOG PERFORMANCE STATUS: 0 - Asymptomatic Vitals:   12/03/23 1051  BP: 123/63  Pulse: (!) 105  Resp: 18  Temp: (!) 96.6 F (35.9 C)  SpO2: 97%    Filed Weights   12/03/23 1051  Weight: 143 lb 12.8 oz (65.2 kg)     Physical Exam Constitutional:      General: She is not in acute distress. HENT:     Head: Normocephalic and atraumatic.     Mouth/Throat:     Comments: mucositis Eyes:     General: No scleral icterus. Cardiovascular:     Rate and Rhythm: Normal rate and regular rhythm.  Pulmonary:     Effort: Pulmonary effort is normal. No respiratory distress.     Breath sounds: No wheezing.     Comments:  Decreased breath sound bilaterally Abdominal:     General: Bowel sounds are normal. There is no distension.     Palpations: Abdomen is soft.     Comments: +colostomy   Musculoskeletal:        General: No deformity. Normal range of motion.     Cervical back: Normal range of motion and neck supple.  Skin:    General: Skin is warm and dry.     Findings: No erythema or rash.  Neurological:     Mental Status: She is alert and oriented to person, place, and time. Mental status is at baseline.     Cranial Nerves: No cranial nerve deficit.     Coordination: Coordination normal.  Psychiatric:        Mood and Affect: Mood normal.     LABORATORY DATA:  I have reviewed the data as listed    Latest Ref Rng & Units 12/03/2023   10:34 AM 11/07/2023    9:17 AM 10/24/2023    8:25 AM  CBC  WBC 4.0 - 10.5 K/uL 10.2  3.8  6.4   Hemoglobin 12.0 - 15.0 g/dL 11.9  14.7  82.9   Hematocrit 36.0 - 46.0 % 42.9  40.8  41.4   Platelets 150 - 400 K/uL 132  123  133       Latest Ref Rng & Units 12/03/2023   10:34 AM 11/07/2023    9:17 AM 10/24/2023    8:25 AM  CMP  Glucose 70 - 99 mg/dL 562  90  130   BUN 6 - 20 mg/dL 19  14  18    Creatinine 0.44 - 1.00 mg/dL 8.65  7.84  6.96   Sodium 135 - 145 mmol/L 135  136   136   Potassium 3.5 - 5.1 mmol/L 3.6  4.5  4.2   Chloride 98 - 111 mmol/L 101  101  102   CO2 22 - 32 mmol/L 25  27  26    Calcium 8.9 - 10.3 mg/dL 8.8  9.2  8.9   Total Protein 6.5 - 8.1 g/dL 7.2  6.9  7.1   Total Bilirubin <1.2 mg/dL 0.5  0.5  0.5   Alkaline Phos 38 - 126 U/L 69  72  81   AST 15 - 41 U/L 15  18  20    ALT 0 - 44 U/L 15  15  15          RADIOGRAPHIC STUDIES: I have personally reviewed the radiological images as listed and agreed with the findings in the report. CT CHEST ABDOMEN PELVIS W CONTRAST  Result Date: 11/13/2023 CLINICAL DATA:  History of rectal cancer, follow-up. * Tracking Code: BO *. EXAM: CT CHEST, ABDOMEN, AND PELVIS WITH CONTRAST TECHNIQUE: Multidetector CT imaging of the chest, abdomen and pelvis was performed following the standard protocol during bolus administration of intravenous contrast. RADIATION DOSE REDUCTION: This exam was performed according to the departmental dose-optimization program which includes automated exposure control, adjustment of the mA and/or kV according to patient size and/or use of iterative reconstruction technique. CONTRAST:  85mL OMNIPAQUE IOHEXOL 300 MG/ML  SOLN COMPARISON:  Multiple priors including most recent CT May 31, 2023. FINDINGS: CT CHEST FINDINGS Cardiovascular: Right chest Port-A-Cath with tip near the superior cavoatrial junction. Normal caliber thoracic aorta. No central pulmonary embolus on this nondedicated study. Normal size heart. No significant pericardial effusion/thickening. Mediastinum/Nodes: Thyroid gland is unremarkable. No pathologically enlarged mediastinal, hilar or axillary lymph nodes. The esophagus is grossly unremarkable. Lungs/Pleura: Increase  in cavitation with decreased soft tissue involving the scattered bilateral pulmonary nodules. No new suspicious pulmonary nodules or masses. For reference: -increased cavitation with similar size and decreased soft tissue component in the right middle lobe pulmonary  nodule measuring 4 mm on image 81/3, unchanged. -decreased size of the subpleural previously cavitary left lower lobe pulmonary nodule now measuring 4 mm on image 74/3 previously 6 mm. -decreased size of the cavitary left lower lobe pulmonary nodule measuring 5 mm on image 92/3 previously 7 mm. -increased cavitation with decreased soft tissue and stable size of the left lower lobe pulmonary nodule measuring 5 mm on image 88/3 Musculoskeletal: No aggressive lytic or blastic lesion of bone. Multilevel degenerative changes spine. CT ABDOMEN PELVIS FINDINGS Hepatobiliary: Decrease in size and conspicuity of the hypodense lesions in the right lobe of the liver dominant lesion now measures 8 mm on image 53/2 previously 16 mm. Tiny adjacent lesion is too small to definitively characterize. No new suspicious hepatic lesion identified. Gallbladder is unremarkable.  No biliary ductal dilation. Pancreas: No pancreatic ductal dilation or evidence of acute inflammation. Spleen: No splenomegaly. Adrenals/Urinary Tract: Bilateral adrenal glands appear normal. No hydronephrosis. Atrophic left kidney with multifocal scarring. Multiple nonobstructive left renal calculi measure up to 4 mm. Right renal cyst and bilateral renal lesions technically too small to accurately characterize but statistically likely reflect cysts. Urinary bladder is unremarkable for degree of distension. Stomach/Bowel: Radiopaque enteric contrast material traverses distal loops of small bowel. Stomach is distended with ingested material and gas without focal wall thickening. No pathologic dilation of small or large bowel. Moderate volume of formed stool in the colon. Similar surgical changes of prior low anterior resection with Hartmann's pouch formation and left anterior abdominal wall colostomy. Similar mesorectal/presacral soft tissue, no new suspicious enhancing nodularity. Vascular/Lymphatic: Normal caliber abdominal aorta. Smooth IVC contours. Prominent  retroperitoneal lymph nodes are similar prior for instance a left periaortic lymph node measuring 9 mm in short axis on image 65/2 is unchanged. Reproductive: Uterus and bilateral adnexa are unremarkable. Other: No significant abdominopelvic free fluid. No discrete peritoneal or omental nodularity. Similar soft tissue nodularity in the bilateral posterior gluteal subcutaneous soft tissues. Musculoskeletal: Postradiation change in the sacrum. Sclerotic focus in the S2 vertebral body is unchanged from prior and favored a bone island. No aggressive lytic or blastic lesion of bone. Multilevel degenerative changes spine. IMPRESSION: 1. Increase in cavitation with decreased soft tissue involving the scattered bilateral pulmonary nodules, consistent with treatment response. No new suspicious pulmonary nodules or masses. 2. Decrease in size and conspicuity of the hypodense lesions in the right lobe of the liver, consistent with treatment response. No new suspicious hepatic lesion identified. 3. Stable prominent nonspecific retroperitoneal lymph nodes. Suggest continued attention on follow-up imaging. 4. Similar surgical changes of prior low anterior resection with left anterior abdominal wall colostomy. Similar mesorectal/presacral soft tissue, no new suspicious enhancing nodularity. 5. Similar soft tissue nodularity in the bilateral posterior gluteal subcutaneous soft tissues. 6. Moderate volume of formed stool in the colon. Correlate for constipation. Electronically Signed   By: Maudry Mayhew M.D.   On: 11/13/2023 13:42

## 2023-12-03 NOTE — Patient Instructions (Signed)
[facility] - A DEPT OF [facility]  Discharge Instructions: Thank you for choosing [facility] to provide your oncology and hematology care.  If you have a lab appointment with the Cancer Center, please go directly to the Cancer Center and check in at the registration area.  Wear comfortable clothing and clothing appropriate for easy access to any Portacath or PICC line.   We strive to give you quality time with your provider. You may need to reschedule your appointment if you arrive late (15 or more minutes).  Arriving late affects you and other patients whose appointments are after yours.  Also, if you miss three or more appointments without notifying the office, you may be dismissed from the clinic at the provider's discretion.      For prescription refill requests, have your pharmacy contact our office and allow 72 hours for refills to be completed.    Today you received the following chemotherapy and/or immunotherapy agents Irinotecan, Leucovorin, Mvasi, Adrucil       To help prevent nausea and vomiting after your treatment, we encourage you to take your nausea medication as directed.  BELOW ARE SYMPTOMS THAT SHOULD BE REPORTED IMMEDIATELY: *FEVER GREATER THAN 100.4 F (38 C) OR HIGHER *CHILLS OR SWEATING *NAUSEA AND VOMITING THAT IS NOT CONTROLLED WITH YOUR NAUSEA MEDICATION *UNUSUAL SHORTNESS OF BREATH *UNUSUAL BRUISING OR BLEEDING *URINARY PROBLEMS (pain or burning when urinating, or frequent urination) *BOWEL PROBLEMS (unusual diarrhea, constipation, pain near the anus) TENDERNESS IN MOUTH AND THROAT WITH OR WITHOUT PRESENCE OF ULCERS (sore throat, sores in mouth, or a toothache) UNUSUAL RASH, SWELLING OR PAIN  UNUSUAL VAGINAL DISCHARGE OR ITCHING   Items with * indicate a potential emergency and should be followed up as soon as possible or go to the Emergency Department if any problems should occur.  Please show the CHEMOTHERAPY  ALERT CARD or IMMUNOTHERAPY ALERT CARD at check-in to the Emergency Department and triage nurse.  Should you have questions after your visit or need to cancel or reschedule your appointment, please contact [facility] - A DEPT OF [facility]  [phone] and follow the prompts.  Office hours are 8:00 a.m. to 4:30 p.m. Monday - Friday. Please note that voicemails left after 4:00 p.m. may not be returned until the following business day.  We are closed weekends and major holidays. You have access to a nurse at all times for urgent questions. Please call the main number to the clinic [phone] and follow the prompts.  For any non-urgent questions, you may also contact your provider using MyChart. We now offer e-Visits for anyone 58 and older to request care online for non-urgent symptoms. For details visit [email or web address].   Also download the MyChart app! Go to the app store, search "MyChart", open the app, select [facility], and log in with your MyChart username and password.

## 2023-12-03 NOTE — Telephone Encounter (Signed)
Request to addon HER 2 IHC to specimen [phone] has been faxed to [facility] path lab at [phone]

## 2023-12-04 ENCOUNTER — Encounter: Payer: Self-pay | Admitting: Oncology

## 2023-12-04 ENCOUNTER — Encounter: Payer: Self-pay | Admitting: Nurse Practitioner

## 2023-12-04 ENCOUNTER — Other Ambulatory Visit: Payer: Self-pay | Admitting: Family Medicine

## 2023-12-04 ENCOUNTER — Other Ambulatory Visit: Payer: Self-pay | Admitting: Urology

## 2023-12-04 DIAGNOSIS — N3281 Overactive bladder: Secondary | ICD-10-CM

## 2023-12-04 DIAGNOSIS — E1129 Type 2 diabetes mellitus with other diabetic kidney complication: Secondary | ICD-10-CM

## 2023-12-04 DIAGNOSIS — Z933 Colostomy status: Secondary | ICD-10-CM | POA: Diagnosis not present

## 2023-12-04 LAB — CEA: CEA: 4.7 ng/mL (ref 0.0–4.7)

## 2023-12-05 ENCOUNTER — Inpatient Hospital Stay: Payer: 59

## 2023-12-05 VITALS — BP 129/94 | HR 75 | Resp 18

## 2023-12-05 DIAGNOSIS — F17210 Nicotine dependence, cigarettes, uncomplicated: Secondary | ICD-10-CM | POA: Diagnosis not present

## 2023-12-05 DIAGNOSIS — J449 Chronic obstructive pulmonary disease, unspecified: Secondary | ICD-10-CM | POA: Diagnosis not present

## 2023-12-05 DIAGNOSIS — N200 Calculus of kidney: Secondary | ICD-10-CM | POA: Diagnosis not present

## 2023-12-05 DIAGNOSIS — E1122 Type 2 diabetes mellitus with diabetic chronic kidney disease: Secondary | ICD-10-CM | POA: Diagnosis not present

## 2023-12-05 DIAGNOSIS — G4730 Sleep apnea, unspecified: Secondary | ICD-10-CM | POA: Diagnosis not present

## 2023-12-05 DIAGNOSIS — R197 Diarrhea, unspecified: Secondary | ICD-10-CM | POA: Diagnosis not present

## 2023-12-05 DIAGNOSIS — C787 Secondary malignant neoplasm of liver and intrahepatic bile duct: Secondary | ICD-10-CM | POA: Diagnosis not present

## 2023-12-05 DIAGNOSIS — Z87442 Personal history of urinary calculi: Secondary | ICD-10-CM | POA: Diagnosis not present

## 2023-12-05 DIAGNOSIS — E1140 Type 2 diabetes mellitus with diabetic neuropathy, unspecified: Secondary | ICD-10-CM | POA: Diagnosis not present

## 2023-12-05 DIAGNOSIS — K219 Gastro-esophageal reflux disease without esophagitis: Secondary | ICD-10-CM | POA: Diagnosis not present

## 2023-12-05 DIAGNOSIS — J9601 Acute respiratory failure with hypoxia: Secondary | ICD-10-CM | POA: Diagnosis not present

## 2023-12-05 DIAGNOSIS — Z5111 Encounter for antineoplastic chemotherapy: Secondary | ICD-10-CM | POA: Diagnosis not present

## 2023-12-05 DIAGNOSIS — E039 Hypothyroidism, unspecified: Secondary | ICD-10-CM | POA: Diagnosis not present

## 2023-12-05 DIAGNOSIS — T451X5A Adverse effect of antineoplastic and immunosuppressive drugs, initial encounter: Secondary | ICD-10-CM | POA: Diagnosis not present

## 2023-12-05 DIAGNOSIS — D122 Benign neoplasm of ascending colon: Secondary | ICD-10-CM | POA: Diagnosis not present

## 2023-12-05 DIAGNOSIS — I10 Essential (primary) hypertension: Secondary | ICD-10-CM | POA: Diagnosis not present

## 2023-12-05 DIAGNOSIS — E785 Hyperlipidemia, unspecified: Secondary | ICD-10-CM | POA: Diagnosis not present

## 2023-12-05 DIAGNOSIS — I700 Atherosclerosis of aorta: Secondary | ICD-10-CM | POA: Diagnosis not present

## 2023-12-05 DIAGNOSIS — C20 Malignant neoplasm of rectum: Secondary | ICD-10-CM

## 2023-12-05 DIAGNOSIS — J441 Chronic obstructive pulmonary disease with (acute) exacerbation: Secondary | ICD-10-CM | POA: Diagnosis not present

## 2023-12-05 DIAGNOSIS — Z79899 Other long term (current) drug therapy: Secondary | ICD-10-CM | POA: Diagnosis not present

## 2023-12-05 DIAGNOSIS — G620 Drug-induced polyneuropathy: Secondary | ICD-10-CM | POA: Diagnosis not present

## 2023-12-05 MED ORDER — SODIUM CHLORIDE 0.9% FLUSH
10.0000 mL | INTRAVENOUS | Status: DC | PRN
Start: 2023-12-05 — End: 2023-12-05
  Administered 2023-12-05: 10 mL
  Filled 2023-12-05: qty 10

## 2023-12-05 MED ORDER — HEPARIN SOD (PORK) LOCK FLUSH 100 UNIT/ML IV SOLN
500.0000 [IU] | Freq: Once | INTRAVENOUS | Status: AC | PRN
  Administered 2023-12-05: 500 [IU]
  Filled 2023-12-05: qty 5

## 2023-12-11 ENCOUNTER — Encounter: Payer: Self-pay | Admitting: Physician Assistant

## 2023-12-11 ENCOUNTER — Ambulatory Visit: Payer: 59 | Admitting: Physician Assistant

## 2023-12-11 VITALS — BP 123/85 | HR 106 | Ht 59.0 in | Wt 142.0 lb

## 2023-12-11 DIAGNOSIS — M545 Low back pain, unspecified: Secondary | ICD-10-CM | POA: Diagnosis not present

## 2023-12-11 DIAGNOSIS — R300 Dysuria: Secondary | ICD-10-CM

## 2023-12-11 DIAGNOSIS — G8929 Other chronic pain: Secondary | ICD-10-CM

## 2023-12-11 LAB — URINALYSIS, COMPLETE
Bilirubin, UA: NEGATIVE
Ketones, UA: NEGATIVE
Nitrite, UA: NEGATIVE
Protein,UA: NEGATIVE
RBC, UA: NEGATIVE
Specific Gravity, UA: <1.005 — ABNORMAL LOW (ref 1.005–1.030)
Urobilinogen, Ur: 0.2 mg/dL (ref 0.2–1.0)
pH, UA: 6 (ref 5.0–7.5)

## 2023-12-11 LAB — MICROSCOPIC EXAMINATION: Epithelial Cells (non renal): >10 /[HPF] — AB (ref 0–10)

## 2023-12-11 LAB — BLADDER SCAN AMB NON-IMAGING

## 2023-12-11 MED ORDER — GEMTESA 75 MG PO TABS: 75.0000 mg | ORAL_TABLET | Freq: Every day | ORAL | Status: AC

## 2023-12-11 NOTE — Progress Notes (Signed)
12/11/2023 2:25 PM   April Luna 1971-07-21 [id number]  CC: Chief Complaint  Patient presents with   Follow-up   Recurrent UTI   HPI: April Luna is a 52 y.o. female with PMH diabetes, stage IV rectal cancer, OAB on oxybutynin XL 10 mg, and nephrolithiasis who presents today for evaluation of possible UTI.  She reported dysuria to Dr. Cathie Hoops on 11/07/2023 and was treated with empiric Cipro 500 mg twice daily x 7 days.  UA was notable for pyuria at the time.  Urine culture grew ampicillin and amp/sulbactam resistant Klebsiella oxytoca.  She had persistent dysuria 12 days later and UA was bland at that time.  Urine culture was negative.  She was referred back to [place] for further evaluation.  Today she has a rather pan-positive review of systems. She reports significant urinary leakage with secondary skin irritation.  She clarifies that her burning is on the labia/vulva when the urine hits her skin.  She has been using zinc oxide cream.  She remains on oxybutynin and reports constipation, dry mouth, and dry eye.  She does not think the medication is helping much.  She is rather tearful today and also admits to some urinary hesitancy.  She also reports left low back pain at the top of her hip that is worse in the morning.  She wonders if this could be related to her kidney stones.  In-office UA today positive for trace glucose and trace leukocytes; urine microscopy with 6-10 WBCs/HPF and >10 epithelial cells/hpf.  PVR 0 mL.  PMH: Past Medical History:  Diagnosis Date   Allergy    pollen   Anxiety    Arthritis    right hip   Bipolar 1 disorder (HCC)    Cancer (HCC)    rectal   Chemotherapy-induced neuropathy (HCC) 10/24/2022   Chronic kidney disease    COPD (chronic obstructive pulmonary disease) (HCC)    Depression    Family history of adverse reaction to anesthesia    grand father had a stroke during anesthesia   Family history of breast cancer    Family history of  colon cancer    Family history of uterine cancer    GERD (gastroesophageal reflux disease)    History of kidney stones    Hyperlipidemia    Hypertension    Hypothyroidism    Panic attack    Pneumonia    Psoriasis    Sleep apnea 08/11/2021   No CPAP   Type 2 diabetes mellitus with microalbuminuria, without long-term current use of insulin (HCC) 06/24/2019    Surgical History: Past Surgical History:  Procedure Laterality Date   BREAST BIOPSY Left 12/14/2021   [place] bx, venus marker, path pending   CESAREAN SECTION     COLONOSCOPY WITH PROPOFOL N/A 02/09/2022   Procedure: COLONOSCOPY WITH PROPOFOL;  Surgeon: Toney Reil, MD;  Location: [facility];  Service: Endoscopy;  Laterality: N/A;  sleep apnea   CYSTOSCOPY W/ RETROGRADES Left 11/08/2018   Procedure: CYSTOSCOPY WITH RETROGRADE PYELOGRAM;  Surgeon: Sondra Come, MD;  Location: [facility];  Service: Urology;  Laterality: Left;   CYSTOSCOPY/URETEROSCOPY/HOLMIUM LASER/STENT PLACEMENT Left 11/08/2018   Procedure: CYSTOSCOPY/URETEROSCOPY/HOLMIUM LASER/STENT PLACEMENT;  Surgeon: Sondra Come, MD;  Location: [facility];  Service: Urology;  Laterality: Left;   IR CV LINE INJECTION  06/25/2023   IR IMAGING GUIDED PORT INSERTION  05/11/2022   IR REMOVE CV FIBRIN SHEATH  06/27/2023   MOUTH SURGERY     wisdom  teeth extraction   MOUTH SURGERY     teeth removal   POLYPECTOMY N/A 02/09/2022   Procedure: POLYPECTOMY;  Surgeon: Toney Reil, MD;  Location: [facility];  Service: Endoscopy;  Laterality: N/A;   XI ROBOTIC ASSISTED LOWER ANTERIOR RESECTION N/A 04/21/2022   Procedure: XI ROBOTIC ASSISTED LOWER ANTERIOR RESECTION WITH COLOSTOMY, BILATERAL TAP BLOCK, ASSESSMENT OF TISSUE PERFUSSION WITH FIREFLY INJECTION;  Surgeon: Andria Meuse, MD;  Location: [facility];  Service: General;  Laterality: N/A;    Home Medications:  Allergies as of 12/11/2023       Reactions   Metformin And Related Nausea And Vomiting    Nsaids Other (See Comments)   Stage 3 CKD   Perphenazine Other (See Comments)   Tremors, muscle weakness, tongue swelling   Sulfa Antibiotics Other (See Comments)   GI distress   Abilify [aripiprazole] Rash   Penicillins Rash   She has taken amoxicillin without problems        Medication List        Accurate as of December 11, 2023  2:25 PM. If you have any questions, ask your nurse or doctor.          STOP taking these medications    Accu-Chek Guide test strip Generic drug: glucose blood Stopped by: Carman Ching   CHEWABLES MULTIVITAMIN PO Stopped by: Carman Ching   ciprofloxacin 500 MG tablet Commonly known as: Cipro Stopped by: Carman Ching   Hydrocortisone (Perianal) 1 % Crea Stopped by: Carman Ching   hydrocortisone cream 0.5 % Stopped by: Carman Ching   Insulin Pen Needle 32G X 4 MM Misc Stopped by: Carman Ching   ipratropium-albuterol 0.5-2.5 (3) MG/3ML Soln Commonly known as: DUONEB Stopped by: Carman Ching   NON FORMULARY Stopped by: Carman Ching   nystatin cream Commonly known as: MYCOSTATIN Stopped by: Carman Ching   predniSONE 10 MG (21) Tbpk tablet Commonly known as: STERAPRED UNI-PAK 21 TAB Stopped by: Carman Ching       TAKE these medications    acetaminophen 325 MG tablet Commonly known as: TYLENOL Take 650 mg by mouth every 6 (six) hours as needed for headache (pain).   AeroChamber MV inhaler Use as instructed   albuterol 108 (90 Base) MCG/ACT inhaler Commonly known as: Ventolin HFA Inhale 2 puffs into the lungs every 6 (six) hours as needed for wheezing or shortness of breath.   amantadine 100 MG capsule Commonly known as: SYMMETREL Take 100 mg by mouth 2 (two) times daily.   blood glucose meter kit and supplies Dispense based on patient and insurance preference. Use up to four times daily as directed. (FOR ICD-10 E10.9,  E11.9).   Breztri Aerosphere 160-9-4.8 MCG/ACT Aero Generic drug: Budeson-Glycopyrrol-Formoterol Inhale 2 puffs into the lungs in the morning and at bedtime.   buPROPion 300 MG 24 hr tablet Commonly known as: WELLBUTRIN XL Take 300 mg by mouth daily.   clonazePAM 0.5 MG tablet Commonly known as: KLONOPIN Take 1 mg by mouth 2 (two) times daily as needed.   dexamethasone 4 MG tablet Commonly known as: DECADRON Take 2 tablets (8 mg total) by mouth See admin instructions. Take 8mg  daily for 2 days after each chemotherapy.   diltiazem 120 MG 24 hr capsule Commonly known as: CARDIZEM CD TAKE 1 CAPSULE BY MOUTH DAILY   docusate sodium 100 MG capsule Commonly known as: COLACE TAKE 1 CAPSULE BY MOUTH TWICE DAILY   Farxiga 10 MG Tabs tablet Generic drug: dapagliflozin propanediol TAKE 1  TABLET BY MOUTH DAILY BEFORE BREAKFAST   gabapentin 300 MG capsule Commonly known as: NEURONTIN Take 300 mg by mouth 2 (two) times daily.   icosapent Ethyl 1 g capsule Commonly known as: VASCEPA TAKE 2 CAPSULES BY MOUTH 2 TIMES DAILY   Invega Sustenna 234 MG/1.5ML injection Generic drug: paliperidone Inject 234 mg into the muscle every 30 (thirty) days. On or about the 14th of each month   levothyroxine 75 MCG tablet Commonly known as: SYNTHROID TAKE 1 TABLET BY MOUTH DAILY BEFORE BREAKFAST   lidocaine-prilocaine cream Commonly known as: EMLA Apply to affected area once   loperamide 2 MG capsule Commonly known as: IMODIUM Take 1 capsule (2 mg total) by mouth See admin instructions. Initial: 4 mg,the 2 mg every 2 hours (4 mg every 4 hours at night)  maximum: 16 mg/day   loratadine 10 MG tablet Commonly known as: CLARITIN Take 10 mg by mouth every morning.   magic mouthwash w/lidocaine Soln Take 5 mLs by mouth 4 (four) times daily as needed for mouth pain. Sig: Swish/Swallow 5-10 ml four times a day as needed. Dispense 480 ml. 1RF   ondansetron 8 MG tablet Commonly known as:  ZOFRAN Take 1 tablet (8 mg total) by mouth every 8 (eight) hours as needed for nausea, vomiting or refractory nausea / vomiting. Start on the third day after chemotherapy.   oxybutynin 10 MG 24 hr tablet Commonly known as: DITROPAN-XL Take 1 tablet (10 mg total) by mouth daily.   pantoprazole 40 MG tablet Commonly known as: PROTONIX TAKE 1 TABLET BY MOUTH DAILY   pioglitazone 15 MG tablet Commonly known as: ACTOS Take 1 tablet (15 mg total) by mouth daily.   promethazine 12.5 MG tablet Commonly known as: PHENERGAN Take 1 tablet (12.5 mg total) by mouth every 6 (six) hours as needed for nausea or vomiting.   rosuvastatin 40 MG tablet Commonly known as: CRESTOR TAKE 1 TABLET BY MOUTH EVERY MORNING   sertraline 100 MG tablet Commonly known as: ZOLOFT Take 200 mg by mouth every morning.   Vitamin D-3 125 MCG (5000 UT) Tabs Take 5,000 Units by mouth daily.        Allergies:  Allergies  Allergen Reactions   Metformin And Related Nausea And Vomiting   Nsaids Other (See Comments)    Stage 3 CKD   Perphenazine Other (See Comments)    Tremors, muscle weakness, tongue swelling   Sulfa Antibiotics Other (See Comments)    GI distress    Abilify [Aripiprazole] Rash   Penicillins Rash    She has taken amoxicillin without problems    Family History: Family History  Problem Relation Age of Onset   Depression Mother    Anxiety disorder Mother    Diabetes Mother    Hypertension Mother    Hyperlipidemia Mother    Cancer Mother    Uterine cancer Mother 49   Cervical cancer Mother 39   Colon cancer Father    Depression Brother    Anxiety disorder Brother    Cancer Maternal Aunt        unk types   Diabetes Mellitus II Maternal Grandmother    Hypercholesterolemia Maternal Grandmother    Breast cancer Maternal Grandmother    Cancer Paternal Grandmother    Diabetes Paternal Grandmother    Melanoma Paternal Grandmother    Stomach cancer Paternal Grandmother     Social  History:   reports that she has been smoking cigarettes. She started smoking about 38 years ago.  She has a 9.6 pack-year smoking history. She has been exposed to tobacco smoke. She has never used smokeless tobacco. She reports that she does not currently use alcohol after a past usage of about 4.0 standard drinks of alcohol per week. She reports current drug use. Drug: Marijuana.  Physical Exam: BP 123/85   Pulse (!) 106   Ht 4\' 11"  (1.499 m)   Wt 142 lb (64.4 kg)   BMI 28.68 kg/m   Constitutional:  Alert and oriented, no acute distress, nontoxic appearing HEENT: [place], AT Cardiovascular: No clubbing, cyanosis, or edema Respiratory: Normal respiratory effort, no increased work of breathing Skin: No rashes, bruises or suspicious lesions Neurologic: Grossly intact, no focal deficits, moving all 4 extremities Psychiatric: Anxious and tearful mood and affect  Laboratory Data: Results for orders placed or performed in visit on 12/11/23  Microscopic Examination   Collection Time: 12/11/23  1:59 PM   Urine  Result Value Ref Range   WBC, UA 6-10 (A) 0 - 5 /hpf   RBC, Urine 0-2 0 - 2 /hpf   Epithelial Cells (non renal) >10 (A) 0 - 10 /hpf   Bacteria, UA Few None seen/Few  Urinalysis, Complete   Collection Time: 12/11/23  1:59 PM  Result Value Ref Range   Specific Gravity, UA <1.005 (L) 1.005 - 1.030   pH, UA 6.0 5.0 - 7.5   Color, UA Yellow Yellow   Appearance Ur Clear Clear   Leukocytes,UA Trace (A) Negative   Protein,UA Negative Negative/Trace   Glucose, UA Trace (A) Negative   Ketones, UA Negative Negative   RBC, UA Negative Negative   Bilirubin, UA Negative Negative   Urobilinogen, Ur 0.2 0.2 - 1.0 mg/dL   Nitrite, UA Negative Negative   Microscopic Examination See below:   BLADDER SCAN AMB NON-IMAGING   Collection Time: 12/11/23  2:07 PM  Result Value Ref Range   Scan Result 0ml    *Note: Due to a large number of results and/or encounters for the requested time period, some  results have not been displayed. A complete set of results can be found in Results Review.   Assessment & Plan:   1. Dysuria (Primary) Previously reported dysuria appears to be labial/vulvar burning, suspect moisture dermatitis due to urge incontinence.  I am stopping her oxybutynin due to anticholinergic side effects and I gave her samples of Gemtesa today.  Will see her back in 6 weeks for symptom recheck and PVR.  UA appears contaminated, will plan for a cath specimen on follow-up.  Low suspicion for UTI at this time.  She is emptying appropriately. - BLADDER SCAN AMB NON-IMAGING - Urinalysis, Complete - Vibegron (GEMTESA) 75 MG TABS; Take 1 tablet (75 mg total) by mouth daily.  2. Chronic left-sided low back pain, unspecified whether sciatica present Her pain appears worse in the morning and is rather positional.  Low suspicion for a stone episode especially in the absence of hematuria.  Suspect musculoskeletal etiology.  Return in 6 weeks (on 01/22/2024) for Symptom recheck, pelvic exam, cath UA and measured residual.  Carman Ching, PA-C  [facility] [address] [place], [place] [address] [phone]

## 2023-12-24 ENCOUNTER — Encounter: Payer: Self-pay | Admitting: Oncology

## 2023-12-24 ENCOUNTER — Inpatient Hospital Stay: Payer: 59

## 2023-12-24 ENCOUNTER — Inpatient Hospital Stay (HOSPITAL_BASED_OUTPATIENT_CLINIC_OR_DEPARTMENT_OTHER): Payer: 59 | Admitting: Oncology

## 2023-12-24 VITALS — BP 129/78 | HR 69 | Temp 97.0°F | Resp 18 | Wt 142.3 lb

## 2023-12-24 DIAGNOSIS — T451X5A Adverse effect of antineoplastic and immunosuppressive drugs, initial encounter: Secondary | ICD-10-CM

## 2023-12-24 DIAGNOSIS — G620 Drug-induced polyneuropathy: Secondary | ICD-10-CM | POA: Diagnosis not present

## 2023-12-24 DIAGNOSIS — K521 Toxic gastroenteritis and colitis: Secondary | ICD-10-CM

## 2023-12-24 DIAGNOSIS — E039 Hypothyroidism, unspecified: Secondary | ICD-10-CM | POA: Diagnosis not present

## 2023-12-24 DIAGNOSIS — C20 Malignant neoplasm of rectum: Secondary | ICD-10-CM

## 2023-12-24 DIAGNOSIS — D122 Benign neoplasm of ascending colon: Secondary | ICD-10-CM | POA: Diagnosis not present

## 2023-12-24 DIAGNOSIS — G4730 Sleep apnea, unspecified: Secondary | ICD-10-CM | POA: Diagnosis not present

## 2023-12-24 DIAGNOSIS — F17210 Nicotine dependence, cigarettes, uncomplicated: Secondary | ICD-10-CM | POA: Diagnosis not present

## 2023-12-24 DIAGNOSIS — J449 Chronic obstructive pulmonary disease, unspecified: Secondary | ICD-10-CM | POA: Diagnosis not present

## 2023-12-24 DIAGNOSIS — Z5111 Encounter for antineoplastic chemotherapy: Secondary | ICD-10-CM | POA: Diagnosis not present

## 2023-12-24 DIAGNOSIS — E1122 Type 2 diabetes mellitus with diabetic chronic kidney disease: Secondary | ICD-10-CM | POA: Diagnosis not present

## 2023-12-24 DIAGNOSIS — K219 Gastro-esophageal reflux disease without esophagitis: Secondary | ICD-10-CM | POA: Diagnosis not present

## 2023-12-24 DIAGNOSIS — Z79899 Other long term (current) drug therapy: Secondary | ICD-10-CM | POA: Diagnosis not present

## 2023-12-24 DIAGNOSIS — E785 Hyperlipidemia, unspecified: Secondary | ICD-10-CM | POA: Diagnosis not present

## 2023-12-24 DIAGNOSIS — R197 Diarrhea, unspecified: Secondary | ICD-10-CM | POA: Diagnosis not present

## 2023-12-24 DIAGNOSIS — N200 Calculus of kidney: Secondary | ICD-10-CM | POA: Diagnosis not present

## 2023-12-24 DIAGNOSIS — I10 Essential (primary) hypertension: Secondary | ICD-10-CM | POA: Diagnosis not present

## 2023-12-24 DIAGNOSIS — Z87442 Personal history of urinary calculi: Secondary | ICD-10-CM | POA: Diagnosis not present

## 2023-12-24 DIAGNOSIS — C787 Secondary malignant neoplasm of liver and intrahepatic bile duct: Secondary | ICD-10-CM | POA: Diagnosis not present

## 2023-12-24 DIAGNOSIS — J441 Chronic obstructive pulmonary disease with (acute) exacerbation: Secondary | ICD-10-CM | POA: Diagnosis not present

## 2023-12-24 DIAGNOSIS — E1140 Type 2 diabetes mellitus with diabetic neuropathy, unspecified: Secondary | ICD-10-CM | POA: Diagnosis not present

## 2023-12-24 DIAGNOSIS — I700 Atherosclerosis of aorta: Secondary | ICD-10-CM | POA: Diagnosis not present

## 2023-12-24 DIAGNOSIS — J9601 Acute respiratory failure with hypoxia: Secondary | ICD-10-CM | POA: Diagnosis not present

## 2023-12-24 LAB — CBC WITH DIFFERENTIAL (CANCER CENTER ONLY)
Abs Immature Granulocytes: 0.02 10*3/uL (ref 0.00–0.07)
Basophils Absolute: 0.1 10*3/uL (ref 0.0–0.1)
Basophils Relative: 1 %
Eosinophils Absolute: 0.1 10*3/uL (ref 0.0–0.5)
Eosinophils Relative: 2 %
HCT: 43.1 % (ref 36.0–46.0)
Hemoglobin: 14 g/dL (ref 12.0–15.0)
Immature Granulocytes: 0 %
Lymphocytes Relative: 21 %
Lymphs Abs: 1.2 10*3/uL (ref 0.7–4.0)
MCH: 31.9 pg (ref 26.0–34.0)
MCHC: 32.5 g/dL (ref 30.0–36.0)
MCV: 98.2 fL (ref 80.0–100.0)
Monocytes Absolute: 0.8 10*3/uL (ref 0.1–1.0)
Monocytes Relative: 14 %
Neutro Abs: 3.7 10*3/uL (ref 1.7–7.7)
Neutrophils Relative %: 62 %
Platelet Count: 139 10*3/uL — ABNORMAL LOW (ref 150–400)
RBC: 4.39 MIL/uL (ref 3.87–5.11)
RDW: 14.9 % (ref 11.5–15.5)
WBC Count: 6 10*3/uL (ref 4.0–10.5)
nRBC: 0 % (ref 0.0–0.2)

## 2023-12-24 LAB — CMP (CANCER CENTER ONLY)
ALT: 14 U/L (ref 0–44)
AST: 19 U/L (ref 15–41)
Albumin: 3.8 g/dL (ref 3.5–5.0)
Alkaline Phosphatase: 67 U/L (ref 38–126)
Anion gap: 9 (ref 5–15)
BUN: 10 mg/dL (ref 6–20)
CO2: 27 mmol/L (ref 22–32)
Calcium: 8.6 mg/dL — ABNORMAL LOW (ref 8.9–10.3)
Chloride: 101 mmol/L (ref 98–111)
Creatinine: 1.03 mg/dL — ABNORMAL HIGH (ref 0.44–1.00)
GFR, Estimated: >60 mL/min (ref >60)
Glucose, Bld: 155 mg/dL — ABNORMAL HIGH (ref 70–99)
Potassium: 3.6 mmol/L (ref 3.5–5.1)
Sodium: 137 mmol/L (ref 135–145)
Total Bilirubin: 0.4 mg/dL (ref 0.0–1.2)
Total Protein: 6.9 g/dL (ref 6.5–8.1)

## 2023-12-24 LAB — PROTEIN, URINE, RANDOM: Total Protein, Urine: <6 mg/dL

## 2023-12-24 MED ORDER — DEXAMETHASONE SODIUM PHOSPHATE 10 MG/ML IJ SOLN
10.0000 mg | Freq: Once | INTRAMUSCULAR | Status: AC
  Administered 2023-12-24: 10 mg via INTRAVENOUS
  Filled 2023-12-24: qty 1

## 2023-12-24 MED ORDER — LEUCOVORIN CALCIUM INJECTION 350 MG
400.0000 mg/m2 | Freq: Once | INTRAMUSCULAR | Status: AC
  Administered 2023-12-24: 644 mg via INTRAVENOUS
  Filled 2023-12-24: qty 32.2

## 2023-12-24 MED ORDER — SODIUM CHLORIDE 0.9 % IV SOLN
Freq: Once | INTRAVENOUS | Status: AC
  Filled 2023-12-24: qty 250

## 2023-12-24 MED ORDER — PALONOSETRON HCL INJECTION 0.25 MG/5ML
0.2500 mg | Freq: Once | INTRAVENOUS | Status: AC
  Administered 2023-12-24: 0.25 mg via INTRAVENOUS
  Filled 2023-12-24: qty 5

## 2023-12-24 MED ORDER — ATROPINE SULFATE 1 MG/ML IV SOLN
0.5000 mg | Freq: Once | INTRAVENOUS | Status: AC | PRN
  Administered 2023-12-24: 0.5 mg via INTRAVENOUS

## 2023-12-24 MED ORDER — FLUOROURACIL CHEMO INJECTION 2.5 GM/50ML
400.0000 mg/m2 | Freq: Once | INTRAVENOUS | Status: AC
  Administered 2023-12-24: 650 mg via INTRAVENOUS
  Filled 2023-12-24: qty 13

## 2023-12-24 MED ORDER — SODIUM CHLORIDE 0.9 % IV SOLN
5.0000 mg/kg | Freq: Once | INTRAVENOUS | Status: AC
  Administered 2023-12-24: 300 mg via INTRAVENOUS
  Filled 2023-12-24: qty 12

## 2023-12-24 MED ORDER — SODIUM CHLORIDE 0.9 % IV SOLN
2400.0000 mg/m2 | INTRAVENOUS | Status: DC
  Administered 2023-12-24: 3850 mg via INTRAVENOUS
  Filled 2023-12-24: qty 77

## 2023-12-24 MED ORDER — SODIUM CHLORIDE 0.9 % IV SOLN
180.0000 mg/m2 | Freq: Once | INTRAVENOUS | Status: AC
  Administered 2023-12-24: 300 mg via INTRAVENOUS
  Filled 2023-12-24: qty 15

## 2023-12-24 NOTE — Progress Notes (Signed)
Pt here for follow up. Reports she pulled "something" in her back and has been taking Tylenol, which is helping with pain.

## 2023-12-24 NOTE — Assessment & Plan Note (Signed)
Grade 2  Continue gabapentin 300mg  BID.

## 2023-12-24 NOTE — Assessment & Plan Note (Signed)
Recommend Imodium PRN as directed.

## 2023-12-24 NOTE — Progress Notes (Signed)
1.  Recent Hematology/Oncology Progress note [phone] [phone]      CHIEF COMPLAINTS/REASON FOR VISIT:  Follow-up for rectal cancer treatments.  ASSESSMENT & PLAN:   Cancer Staging  Rectal cancer [facility]) Staging form: Colon and Rectum, AJCC 8th Edition - Pathologic stage from 05/03/2022: Stage IIIB (pT3, pN1b, cM0) - Signed by [name], MD on 05/03/2022 - Pathologic stage from 06/18/2023: Stage IVA (rpTX, pN0, pM1a) - Signed by [name], MD on 06/18/2023   Rectal cancer [facility]) History of Stage III. pT3 pN1b cM0, s/p APR. 05/2023 Stage IV current rectal adenocarcinoma Currently on adjuvant chemotherapy. S/p FOLFOX x 4 cycles S/p concurrent chemotherapy [Xeloda 1300 mg BID] with RT, and then another 4 cycles of adjuvant FOLFOX [dose reduced oxaliplatin and omit 5-FU bolus 05/2023 CT imaging indicates disease progression. liver biopsy pathology showed metastatic carcinoma--> FOLFIRI + Bevacizumab   [facility] NGS showed TP53 missense variant, APC frameshift, TCF7L2 Frameshift, FLT3 copy number gain., TMB 4.7, pMMR Wildtype KRAS/NRAS Labs are reviewed and discussed with patient. Proceed with FOLFIRI + Bevacizumab     Chemotherapy induced diarrhea Recommend Imodium PRN as directed.   Chemotherapy-induced neuropathy (HCC) Grade 2  Continue gabapentin 300mg  BID.   Encounter for antineoplastic chemotherapy Chemotherapy plan as listed above   Orders Placed This Encounter  Procedures   CEA    Standing Status:   Future    Expected Date:   01/21/2024    Expiration Date:   01/20/2025   Protein, urine, random    Standing Status:   Future    Expected Date:   01/21/2024    Expiration Date:   01/20/2025   CBC with Differential (Cancer Center Only)    Standing Status:   Future    Expected Date:   01/21/2024    Expiration Date:   01/20/2025   CMP (Cancer Center only)    Standing Status:   Future    Expected Date:   01/21/2024    Expiration Date:   01/20/2025     Follow-up  2 weeks   All questions were answered. The patient knows to call the clinic with any problems, questions or concerns.  [name], MD, PhD [facility] Hematology Oncology 12/24/2023      HISTORY OF PRESENTING ILLNESS:   April Luna is a  52 y.o.  female presents for treatment of rectal cancer Oncology History  Rectal cancer (HCC)  02/26/2022 Imaging   MRI PELVIS WITHOUT CONTRAST- By imaging, rectal cancer stage:  T1/T2, N0, Mx    03/02/2022 Imaging   CT CHEST AND ABDOMEN WITH CONTRAST 1. No convincing evidence of metastatic disease within the chest or abdomen. 2. Atrophic left kidney with multifocal renal scarring and cortical calcifications as well as nonobstructive renal stones measuring up to 5 mm. 3. Prominent left-sided predominant retroperitoneal lymph nodes measuring up to 8 mm near the level of the renal hilum, overall decreased in size dating back to CT September 19, 2018 and favored reactive related to left renal inflammation. 4.  Aortic Atherosclerosis (ICD10-I70.0).   03/27/2022 Genetic Testing    [facility] CustomNext+RNA cancer panel found no pathogenic mutations.    04/21/2022 Initial Diagnosis   Rectal cancer - baseline CEA 3.6 -02/09/2022, patient had colonoscopy which showed renal mass 10 cm from anal verge.  5 mm polyp in ascending colon.  Removed and retrieved. Pathology showed rectal adenocarcinoma.  The polyp in the ascending colon is a tubular adenoma.  MRI showed cT1/T2N0 disease  -04/21/2022, patient underwent robotic  assisted ultralow anterior resection. Pathology showed moderately differentiated adenocarcinoma, 4.5 cm in maximal extent, with focal extension through muscularis propria into perirectal soft tissue.  3 lymph nodes positive for metastatic carcinoma.  Negative margin.  pT3 pN1b, MSI stable.   05/03/2022 Cancer Staging   Staging form: Colon and Rectum, AJCC 8th Edition - Pathologic stage from 05/03/2022: Stage IIIB (pT3, pN1b, cM0)  - Signed by [name], MD on 05/03/2022 Stage prefix: Initial diagnosis   05/11/2022 Miscellaneous   Medi port placed by Dr.White   05/26/2022 -  Chemotherapy   FOLFOX Q2 weeks x 4   05/26/2022 - 07/09/2022 Chemotherapy   Patient is on Treatment Plan : COLORECTAL FOLFOX q14d x 8 cycles     05/26/2022 - 12/13/2022 Chemotherapy   Patient is on Treatment Plan : COLORECTAL FOLFOX q14d x 4 months     07/27/2022 -  Chemotherapy   Concurrent chemotherapy- Xeloda  1300mg  BID and radiation.    07/27/2022 - 09/12/2022 Radiation Therapy    concurrent chemotherapy [Xeloda 1300 mg BID] with RT    09/20/2022 Imaging   CT Angiogram chest PET protocol There is no evidence of pulmonary artery embolism. There is no evidence of thoracic aortic dissection.   Small linear patchy alveolar infiltrate is seen in medial segment of right middle lobe suggesting atelectasis/pneumonia.     09/28/2022 - 09/30/2022 Hospital Admission   Admission due to acute respiratory failure with hypoxia, COPD exacerbation   09/28/2022 Imaging   CT angio chest PE protocol 1. Negative for acute PE or thoracic aortic dissection. 2. New ground-glass infiltrates anteriorly in bilateral upper lobes,may represent atypical edema, infectious or inflammatory process. 3.  Aortic Atherosclerosis     12/22/2022 Imaging   CT chest abdomen pelvis w contrast  Stable exam. No evidence of recurrent or metastatic carcinoma within the chest, abdomen, or pelvis.   Left nephrolithiasis and renal parenchymal atrophy. No evidence of ureteral calculi or hydronephrosis.   Aortic Atherosclerosis   05/31/2023 Imaging   CT chest abdomen pelvis with contrast showed 1. New hypodense 16 mm lesion in the right lobe of the liver with very subtle adjacent 5 mm lesion, suspicious for metastatic disease. 2. Small bilateral pulmonary nodules, some of which were subtly evident on prior examination only in retrospect, some of which demonstrate degree of cavitation,  suspicious for metastatic disease. 3. Prominent retroperitoneal lymph nodes are similar prior. 4. Prior low anterior resection with Hartmann's pouch formation,similar small amount of perirectal/presacral soft tissue and fluid,favored postsurgical/posttreatment change. Suggest continued attention on follow-up imaging. 5. Large volume of formed stool in the colon. 6. Nonobstructive left renal calculi measure under 1 cm.     06/08/2023 Relapse/Recurrence   Ultrasound-guided liver biopsy showed Pathology showed metastatic carcinoma, morphology consistent with patient's clinical history of rectal adenocarcinoma.  [facility] NGS 648 gene panel showed TP53 missense variant, APC frameshift, TCF7L2 frameshift, FLT3 copy number gain, TMB 4.73m/MB MSI stable.  Wildtype KRAS/NRAS  [facility] RNA Seq - ERBB3 overexpressed, VEGFA overexpressed, NRAS overexpressed.     06/18/2023 Cancer Staging   Staging form: Colon and Rectum, AJCC 8th Edition - Pathologic stage from 06/18/2023: Stage IVA (rpTX, pN0, pM1a) - Signed by [name], MD on 06/18/2023 Stage prefix: Recurrence Total positive nodes: 0   06/25/2023 - 06/25/2023 Chemotherapy   Patient is on Treatment Plan : COLORECTAL FOLFOX + Bevacizumab q14d     07/02/2023 -  Chemotherapy   Patient is on Treatment Plan : COLORECTAL FOLFIRI + Bevacizumab q14d  Patient has bipolar and schizophrenia..  Patient is married  She has housing issue due to previous history of eviction.  she does not live with her husband currently. She lives with some family members.   INTERVAL HISTORY EUPHA SLAVEY is a 52 y.o. female who has above history reviewed by me today presents for follow up visit for rectal cancer.  She feels well today.  Denies any melena or blood in the stool. Stable neuropathy symptoms. Diarrhea managed by anti diarrhea medication + chronic dysuria and urgency she was seen by Urology and was started on [place].  Symptoms are improved.     Review of  Systems  Constitutional:  Positive for fatigue. Negative for appetite change, chills and fever.  HENT:   Negative for hearing loss, mouth sores and voice change.   Eyes:  Negative for eye problems.  Respiratory:  Negative for chest tightness, cough and shortness of breath.   Cardiovascular:  Negative for chest pain.  Gastrointestinal:  Negative for abdominal distention, abdominal pain, blood in stool, diarrhea and nausea.       + colostomy  Endocrine: Negative for hot flashes.  Genitourinary:  Positive for dysuria and frequency. Negative for difficulty urinating.   Musculoskeletal:  Negative for arthralgias.  Skin:  Negative for itching and rash.  Neurological:  Positive for numbness. Negative for extremity weakness.  Hematological:  Negative for adenopathy.  Psychiatric/Behavioral:  Negative for confusion.     MEDICAL HISTORY:  Past Medical History:  Diagnosis Date   Allergy    pollen   Anxiety    Arthritis    right hip   Bipolar 1 disorder (HCC)    Cancer (HCC)    rectal   Chemotherapy-induced neuropathy (HCC) 10/24/2022   Chronic kidney disease    COPD (chronic obstructive pulmonary disease) (HCC)    Depression    Family history of adverse reaction to anesthesia    grand father had a stroke during anesthesia   Family history of breast cancer    Family history of colon cancer    Family history of uterine cancer    GERD (gastroesophageal reflux disease)    History of kidney stones    Hyperlipidemia    Hypertension    Hypothyroidism    Panic attack    Pneumonia    Psoriasis    Sleep apnea 08/11/2021   No CPAP   Type 2 diabetes mellitus with microalbuminuria, without long-term current use of insulin (HCC) 06/24/2019    SURGICAL HISTORY: Past Surgical History:  Procedure Laterality Date   BREAST BIOPSY Left 12/14/2021   [place] bx, venus marker, path pending   CESAREAN SECTION     COLONOSCOPY WITH PROPOFOL N/A 02/09/2022   Procedure: COLONOSCOPY WITH PROPOFOL;   Surgeon: Toney Reil, MD;  Location: [facility];  Service: Endoscopy;  Laterality: N/A;  sleep apnea   CYSTOSCOPY W/ RETROGRADES Left 11/08/2018   Procedure: CYSTOSCOPY WITH RETROGRADE PYELOGRAM;  Surgeon: Sondra Come, MD;  Location: [facility];  Service: Urology;  Laterality: Left;   CYSTOSCOPY/URETEROSCOPY/HOLMIUM LASER/STENT PLACEMENT Left 11/08/2018   Procedure: CYSTOSCOPY/URETEROSCOPY/HOLMIUM LASER/STENT PLACEMENT;  Surgeon: Sondra Come, MD;  Location: [facility];  Service: Urology;  Laterality: Left;   IR CV LINE INJECTION  06/25/2023   IR IMAGING GUIDED PORT INSERTION  05/11/2022   IR REMOVE CV FIBRIN SHEATH  06/27/2023   MOUTH SURGERY     wisdom teeth extraction   MOUTH SURGERY     teeth removal   POLYPECTOMY N/A  02/09/2022   Procedure: POLYPECTOMY;  Surgeon: Toney Reil, MD;  Location: [facility];  Service: Endoscopy;  Laterality: N/A;   XI ROBOTIC ASSISTED LOWER ANTERIOR RESECTION N/A 04/21/2022   Procedure: XI ROBOTIC ASSISTED LOWER ANTERIOR RESECTION WITH COLOSTOMY, BILATERAL TAP BLOCK, ASSESSMENT OF TISSUE PERFUSSION WITH FIREFLY INJECTION;  Surgeon: Andria Meuse, MD;  Location: [facility];  Service: General;  Laterality: N/A;    SOCIAL HISTORY: Social History   Socioeconomic History   Marital status: Married    Spouse name: Lesle Chris   Number of children: 1   Years of education: 12   Highest education level: High school graduate  Occupational History   Occupation: unemployed    Comment: disabled  Tobacco Use   Smoking status: Some Days    Current packs/day: 0.25    Average packs/day: 0.3 packs/day for 38.6 years (9.7 ttl pk-yrs)    Types: Cigarettes    Start date: 05/13/1985    Passive exposure: Current   Smokeless tobacco: Never   Tobacco comments:    5-6 cigarettes weekly- 11/07/2022  Vaping Use   Vaping status: Former   Start date: 05/25/2018   Quit date: 09/24/2018  Substance and Sexual Activity   Alcohol use:  Not Currently    Alcohol/week: 4.0 standard drinks of alcohol    Types: 4 Glasses of wine per week    Comment: quit ETOH in Feb. 2023   Drug use: Yes    Types: Marijuana    Comment: 2 days ago   Sexual activity: Not Currently    Birth control/protection: Post-menopausal  Other Topics Concern   Not on file  Social History Narrative   Lives with husband and son    Social Drivers of Health   Financial Resource Strain: High Risk (08/30/2023)   Overall Financial Resource Strain (CARDIA)    Difficulty of Paying Living Expenses: Hard  Food Insecurity: Food Insecurity Present (08/30/2023)   Hunger Vital Sign    Worried About Running Out of Food in the Last Year: Sometimes true    Ran Out of Food in the Last Year: Sometimes true  Transportation Needs: No Transportation Needs (08/30/2023)   PRAPARE - Administrator, Civil Service (Medical): No    Lack of Transportation (Non-Medical): No  Physical Activity: Inactive (08/30/2023)   Exercise Vital Sign    Days of Exercise per Week: 0 days    Minutes of Exercise per Session: 0 min  Stress: Stress Concern Present (08/30/2023)   [employer] of Occupational Health - Occupational Stress Questionnaire    Feeling of Stress : Very much  Social Connections: Moderately Integrated (08/30/2023)   Social Connection and Isolation Panel [NHANES]    Frequency of Communication with Friends and Family: Never    Frequency of Social Gatherings with Friends and Family: Once a week    Attends Religious Services: More than 4 times per year    Active Member of [employer] or Organizations: No    Attends Banker Meetings: 1 to 4 times per year    Marital Status: Married  Catering manager Violence: Not At Risk (08/30/2023)   Humiliation, Afraid, Rape, and Kick questionnaire    Fear of Current or Ex-Partner: No    Emotionally Abused: No    Physically Abused: No    Sexually Abused: No    FAMILY HISTORY: Family History  Problem Relation Age of  Onset   Depression Mother    Anxiety disorder Mother    Diabetes  Mother    Hypertension Mother    Hyperlipidemia Mother    Cancer Mother    Uterine cancer Mother 97   Cervical cancer Mother 59   Colon cancer Father    Depression Brother    Anxiety disorder Brother    Cancer Maternal Aunt        unk types   Diabetes Mellitus II Maternal Grandmother    Hypercholesterolemia Maternal Grandmother    Breast cancer Maternal Grandmother    Cancer Paternal Grandmother    Diabetes Paternal Grandmother    Melanoma Paternal Grandmother    Stomach cancer Paternal Grandmother     ALLERGIES:  is allergic to metformin and related, nsaids, perphenazine, sulfa antibiotics, abilify [aripiprazole], and penicillins.  MEDICATIONS:  Current Outpatient Medications  Medication Sig Dispense Refill   acetaminophen (TYLENOL) 325 MG tablet Take 650 mg by mouth every 6 (six) hours as needed for headache (pain).     albuterol (VENTOLIN HFA) 108 (90 Base) MCG/ACT inhaler Inhale 2 puffs into the lungs every 6 (six) hours as needed for wheezing or shortness of breath. 1 each 5   amantadine (SYMMETREL) 100 MG capsule Take 100 mg by mouth 2 (two) times daily.     blood glucose meter kit and supplies Dispense based on patient and insurance preference. Use up to four times daily as directed. (FOR ICD-10 E10.9, E11.9). 1 each 0   Budeson-Glycopyrrol-Formoterol (BREZTRI AEROSPHERE) 160-9-4.8 MCG/ACT AERO Inhale 2 puffs into the lungs in the morning and at bedtime. 5.9 g 1   buPROPion (WELLBUTRIN XL) 300 MG 24 hr tablet Take 300 mg by mouth daily.     Cholecalciferol (VITAMIN D-3) 125 MCG (5000 UT) TABS Take 5,000 Units by mouth daily. 30 tablet 1   clonazePAM (KLONOPIN) 0.5 MG tablet Take 1 mg by mouth 2 (two) times daily as needed.     dapagliflozin propanediol (FARXIGA) 10 MG TABS tablet TAKE 1 TABLET BY MOUTH DAILY BEFORE BREAKFAST 90 tablet 0   dexamethasone (DECADRON) 4 MG tablet Take 2 tablets (8 mg total) by  mouth See admin instructions. Take 8mg  daily for 2 days after each chemotherapy. 30 tablet 1   diltiazem (CARDIZEM CD) 120 MG 24 hr capsule TAKE 1 CAPSULE BY MOUTH DAILY 90 capsule 1   gabapentin (NEURONTIN) 300 MG capsule Take 300 mg by mouth 2 (two) times daily.     icosapent Ethyl (VASCEPA) 1 g capsule TAKE 2 CAPSULES BY MOUTH 2 TIMES DAILY 120 capsule 3   levothyroxine (SYNTHROID) 75 MCG tablet TAKE 1 TABLET BY MOUTH DAILY BEFORE BREAKFAST 90 tablet 2   lidocaine-prilocaine (EMLA) cream Apply to affected area once 30 g 3   loperamide (IMODIUM) 2 MG capsule Take 1 capsule (2 mg total) by mouth See admin instructions. Initial: 4 mg,the 2 mg every 2 hours (4 mg every 4 hours at night)  maximum: 16 mg/day 60 capsule 2   loratadine (CLARITIN) 10 MG tablet Take 10 mg by mouth every morning.     magic mouthwash w/lidocaine SOLN Take 5 mLs by mouth 4 (four) times daily as needed for mouth pain. Sig: Swish/Swallow 5-10 ml four times a day as needed. Dispense 480 ml. 1RF 480 mL 1   ondansetron (ZOFRAN) 8 MG tablet Take 1 tablet (8 mg total) by mouth every 8 (eight) hours as needed for nausea, vomiting or refractory nausea / vomiting. Start on the third day after chemotherapy. 30 tablet 1   paliperidone (INVEGA SUSTENNA) 234 MG/1.5ML SUSY injection Inject 234 mg  into the muscle every 30 (thirty) days. On or about the 14th of each month     pantoprazole (PROTONIX) 40 MG tablet TAKE 1 TABLET BY MOUTH DAILY 90 tablet 2   pioglitazone (ACTOS) 15 MG tablet Take 1 tablet (15 mg total) by mouth daily. 90 tablet 1   promethazine (PHENERGAN) 12.5 MG tablet Take 1 tablet (12.5 mg total) by mouth every 6 (six) hours as needed for nausea or vomiting. 30 tablet 0   rosuvastatin (CRESTOR) 40 MG tablet TAKE 1 TABLET BY MOUTH EVERY MORNING 30 tablet 3   sertraline (ZOLOFT) 100 MG tablet Take 200 mg by mouth every morning.     Spacer/Aero-Holding Chambers (AEROCHAMBER MV) inhaler Use as instructed 1 each 0   Vibegron  (GEMTESA) 75 MG TABS Take 1 tablet (75 mg total) by mouth daily.     No current facility-administered medications for this visit.   Facility-Administered Medications Ordered in Other Visits  Medication Dose Route Frequency Provider Last Rate Last Admin   albuterol (PROVENTIL) (2.5 MG/3ML) 0.083% nebulizer solution 2.5 mg  2.5 mg Nebulization Once Raechel Chute, MD       fluorouracil (ADRUCIL) 3,850 mg in sodium chloride 0.9 % 73 mL chemo infusion  2,400 mg/m2 (Treatment Plan Recorded) Intravenous 1 day or 1 dose [name], MD   Infusion Verify at 12/24/23 1449   sodium chloride flush (NS) 0.9 % injection 10 mL  10 mL Intracatheter PRN [name], MD   10 mL at 08/15/23 1327     PHYSICAL EXAMINATION: ECOG PERFORMANCE STATUS: 0 - Asymptomatic Vitals:   12/24/23 1059  BP: 129/78  Pulse: 69  Resp: 18  Temp: (!) 97 F (36.1 C)    Filed Weights   12/24/23 1059  Weight: 142 lb 4.8 oz (64.5 kg)     Physical Exam Constitutional:      General: She is not in acute distress. HENT:     Head: Normocephalic and atraumatic.     Mouth/Throat:     Comments: mucositis Eyes:     General: No scleral icterus. Cardiovascular:     Rate and Rhythm: Normal rate and regular rhythm.  Pulmonary:     Effort: Pulmonary effort is normal. No respiratory distress.     Breath sounds: No wheezing.     Comments: Decreased breath sound bilaterally Abdominal:     General: Bowel sounds are normal. There is no distension.     Palpations: Abdomen is soft.     Comments: +colostomy   Musculoskeletal:        General: No deformity. Normal range of motion.     Cervical back: Normal range of motion and neck supple.  Skin:    General: Skin is warm and dry.     Findings: No erythema or rash.  Neurological:     Mental Status: She is alert and oriented to person, place, and time. Mental status is at baseline.     Cranial Nerves: No cranial nerve deficit.     Coordination: Coordination normal.  Psychiatric:         Mood and Affect: Mood normal.     LABORATORY DATA:  I have reviewed the data as listed    Latest Ref Rng & Units 12/24/2023   10:32 AM 12/03/2023   10:34 AM 11/07/2023    9:17 AM  CBC  WBC 4.0 - 10.5 K/uL 6.0  10.2  3.8   Hemoglobin 12.0 - 15.0 g/dL 16.1  09.6  04.5   Hematocrit 36.0 -  46.0 % 43.1  42.9  40.8   Platelets 150 - 400 K/uL 139  132  123       Latest Ref Rng & Units 12/24/2023   10:32 AM 12/03/2023   10:34 AM 11/07/2023    9:17 AM  CMP  Glucose 70 - 99 mg/dL 235  573  90   BUN 6 - 20 mg/dL 10  19  14    Creatinine 0.44 - 1.00 mg/dL 2.20  2.54  2.70   Sodium 135 - 145 mmol/L 137  135  136   Potassium 3.5 - 5.1 mmol/L 3.6  3.6  4.5   Chloride 98 - 111 mmol/L 101  101  101   CO2 22 - 32 mmol/L 27  25  27    Calcium 8.9 - 10.3 mg/dL 8.6  8.8  9.2   Total Protein 6.5 - 8.1 g/dL 6.9  7.2  6.9   Total Bilirubin 0.0 - 1.2 mg/dL 0.4  0.5  0.5   Alkaline Phos 38 - 126 U/L 67  69  72   AST 15 - 41 U/L 19  15  18    ALT 0 - 44 U/L 14  15  15          RADIOGRAPHIC STUDIES: I have personally reviewed the radiological images as listed and agreed with the findings in the report. No results found.

## 2023-12-24 NOTE — Assessment & Plan Note (Signed)
Chemotherapy plan as listed above 

## 2023-12-24 NOTE — Assessment & Plan Note (Addendum)
History of Stage III. pT3 pN1b cM0, s/p APR. 05/2023 Stage IV current rectal adenocarcinoma Currently on adjuvant chemotherapy. S/p FOLFOX x 4 cycles S/p concurrent chemotherapy [Xeloda 1300 mg BID] with RT, and then another 4 cycles of adjuvant FOLFOX [dose reduced oxaliplatin and omit 5-FU bolus 05/2023 CT imaging indicates disease progression. liver biopsy pathology showed metastatic carcinoma--> FOLFIRI + Bevacizumab   [facility] NGS showed TP53 missense variant, APC frameshift, TCF7L2 Frameshift, FLT3 copy number gain., TMB 4.7, pMMR Wildtype KRAS/NRAS Labs are reviewed and discussed with patient. Proceed with FOLFIRI + Bevacizumab

## 2023-12-24 NOTE — Patient Instructions (Signed)
[facility] - A DEPT OF [facility]  Discharge Instructions: Thank you for choosing [facility] to provide your oncology and hematology care.  If you have a lab appointment with the Cancer Center, please go directly to the Cancer Center and check in at the registration area.  Wear comfortable clothing and clothing appropriate for easy access to any Portacath or PICC line.   We strive to give you quality time with your provider. You may need to reschedule your appointment if you arrive late (15 or more minutes).  Arriving late affects you and other patients whose appointments are after yours.  Also, if you miss three or more appointments without notifying the office, you may be dismissed from the clinic at the provider's discretion.      For prescription refill requests, have your pharmacy contact our office and allow 72 hours for refills to be completed.    Today you received the following chemotherapy and/or immunotherapy agents- bevacizumab, irinotecan, 5FU      To help prevent nausea and vomiting after your treatment, we encourage you to take your nausea medication as directed.  BELOW ARE SYMPTOMS THAT SHOULD BE REPORTED IMMEDIATELY: *FEVER GREATER THAN 100.4 F (38 C) OR HIGHER *CHILLS OR SWEATING *NAUSEA AND VOMITING THAT IS NOT CONTROLLED WITH YOUR NAUSEA MEDICATION *UNUSUAL SHORTNESS OF BREATH *UNUSUAL BRUISING OR BLEEDING *URINARY PROBLEMS (pain or burning when urinating, or frequent urination) *BOWEL PROBLEMS (unusual diarrhea, constipation, pain near the anus) TENDERNESS IN MOUTH AND THROAT WITH OR WITHOUT PRESENCE OF ULCERS (sore throat, sores in mouth, or a toothache) UNUSUAL RASH, SWELLING OR PAIN  UNUSUAL VAGINAL DISCHARGE OR ITCHING   Items with * indicate a potential emergency and should be followed up as soon as possible or go to the Emergency Department if any problems should occur.  Please show the CHEMOTHERAPY ALERT CARD  or IMMUNOTHERAPY ALERT CARD at check-in to the Emergency Department and triage nurse.  Should you have questions after your visit or need to cancel or reschedule your appointment, please contact [facility] - A DEPT OF [facility]  [phone] and follow the prompts.  Office hours are 8:00 a.m. to 4:30 p.m. Monday - Friday. Please note that voicemails left after 4:00 p.m. may not be returned until the following business day.  We are closed weekends and major holidays. You have access to a nurse at all times for urgent questions. Please call the main number to the clinic [phone] and follow the prompts.  For any non-urgent questions, you may also contact your provider using MyChart. We now offer e-Visits for anyone 43 and older to request care online for non-urgent symptoms. For details visit [email or web address].   Also download the MyChart app! Go to the app store, search "MyChart", open the app, select [place], and log in with your MyChart username and password.

## 2023-12-25 LAB — CEA: CEA: 4.3 ng/mL (ref 0.0–4.7)

## 2023-12-27 ENCOUNTER — Inpatient Hospital Stay: Payer: 59

## 2023-12-27 ENCOUNTER — Inpatient Hospital Stay: Payer: 59 | Attending: Oncology

## 2023-12-27 VITALS — BP 124/81 | HR 110 | Temp 96.0°F | Resp 18

## 2023-12-27 DIAGNOSIS — R197 Diarrhea, unspecified: Secondary | ICD-10-CM | POA: Insufficient documentation

## 2023-12-27 DIAGNOSIS — Z5111 Encounter for antineoplastic chemotherapy: Secondary | ICD-10-CM | POA: Diagnosis not present

## 2023-12-27 DIAGNOSIS — I700 Atherosclerosis of aorta: Secondary | ICD-10-CM | POA: Insufficient documentation

## 2023-12-27 DIAGNOSIS — Z79890 Hormone replacement therapy (postmenopausal): Secondary | ICD-10-CM | POA: Insufficient documentation

## 2023-12-27 DIAGNOSIS — E785 Hyperlipidemia, unspecified: Secondary | ICD-10-CM | POA: Insufficient documentation

## 2023-12-27 DIAGNOSIS — T451X5A Adverse effect of antineoplastic and immunosuppressive drugs, initial encounter: Secondary | ICD-10-CM | POA: Diagnosis not present

## 2023-12-27 DIAGNOSIS — G620 Drug-induced polyneuropathy: Secondary | ICD-10-CM | POA: Diagnosis not present

## 2023-12-27 DIAGNOSIS — F17210 Nicotine dependence, cigarettes, uncomplicated: Secondary | ICD-10-CM | POA: Insufficient documentation

## 2023-12-27 DIAGNOSIS — E1122 Type 2 diabetes mellitus with diabetic chronic kidney disease: Secondary | ICD-10-CM | POA: Diagnosis not present

## 2023-12-27 DIAGNOSIS — F319 Bipolar disorder, unspecified: Secondary | ICD-10-CM | POA: Diagnosis not present

## 2023-12-27 DIAGNOSIS — Z9221 Personal history of antineoplastic chemotherapy: Secondary | ICD-10-CM | POA: Diagnosis not present

## 2023-12-27 DIAGNOSIS — E1140 Type 2 diabetes mellitus with diabetic neuropathy, unspecified: Secondary | ICD-10-CM | POA: Insufficient documentation

## 2023-12-27 DIAGNOSIS — N189 Chronic kidney disease, unspecified: Secondary | ICD-10-CM | POA: Insufficient documentation

## 2023-12-27 DIAGNOSIS — Z87442 Personal history of urinary calculi: Secondary | ICD-10-CM | POA: Insufficient documentation

## 2023-12-27 DIAGNOSIS — N200 Calculus of kidney: Secondary | ICD-10-CM | POA: Diagnosis not present

## 2023-12-27 DIAGNOSIS — F209 Schizophrenia, unspecified: Secondary | ICD-10-CM | POA: Insufficient documentation

## 2023-12-27 DIAGNOSIS — K219 Gastro-esophageal reflux disease without esophagitis: Secondary | ICD-10-CM | POA: Diagnosis not present

## 2023-12-27 DIAGNOSIS — I129 Hypertensive chronic kidney disease with stage 1 through stage 4 chronic kidney disease, or unspecified chronic kidney disease: Secondary | ICD-10-CM | POA: Insufficient documentation

## 2023-12-27 DIAGNOSIS — Z923 Personal history of irradiation: Secondary | ICD-10-CM | POA: Diagnosis not present

## 2023-12-27 DIAGNOSIS — Z79899 Other long term (current) drug therapy: Secondary | ICD-10-CM | POA: Diagnosis not present

## 2023-12-27 DIAGNOSIS — Z7952 Long term (current) use of systemic steroids: Secondary | ICD-10-CM | POA: Insufficient documentation

## 2023-12-27 DIAGNOSIS — G4730 Sleep apnea, unspecified: Secondary | ICD-10-CM | POA: Diagnosis not present

## 2023-12-27 DIAGNOSIS — C20 Malignant neoplasm of rectum: Secondary | ICD-10-CM | POA: Insufficient documentation

## 2023-12-27 DIAGNOSIS — Z7984 Long term (current) use of oral hypoglycemic drugs: Secondary | ICD-10-CM | POA: Insufficient documentation

## 2023-12-27 DIAGNOSIS — D122 Benign neoplasm of ascending colon: Secondary | ICD-10-CM | POA: Insufficient documentation

## 2023-12-27 DIAGNOSIS — Z803 Family history of malignant neoplasm of breast: Secondary | ICD-10-CM | POA: Insufficient documentation

## 2023-12-27 DIAGNOSIS — E039 Hypothyroidism, unspecified: Secondary | ICD-10-CM | POA: Diagnosis not present

## 2023-12-27 DIAGNOSIS — Z800 Family history of malignant neoplasm of digestive organs: Secondary | ICD-10-CM | POA: Insufficient documentation

## 2023-12-27 MED ORDER — HEPARIN SOD (PORK) LOCK FLUSH 100 UNIT/ML IV SOLN
500.0000 [IU] | Freq: Once | INTRAVENOUS | Status: AC | PRN
  Administered 2023-12-27: 500 [IU]
  Filled 2023-12-27: qty 5

## 2023-12-27 MED ORDER — SODIUM CHLORIDE 0.9% FLUSH
10.0000 mL | INTRAVENOUS | Status: DC | PRN
  Administered 2023-12-27: 10 mL
  Filled 2023-12-27: qty 10

## 2024-01-02 DIAGNOSIS — C20 Malignant neoplasm of rectum: Secondary | ICD-10-CM | POA: Diagnosis not present

## 2024-01-04 ENCOUNTER — Other Ambulatory Visit: Payer: Self-pay | Admitting: Family Medicine

## 2024-01-04 DIAGNOSIS — E1169 Type 2 diabetes mellitus with other specified complication: Secondary | ICD-10-CM

## 2024-01-07 ENCOUNTER — Encounter: Payer: Self-pay | Admitting: Oncology

## 2024-01-07 ENCOUNTER — Inpatient Hospital Stay: Payer: 59

## 2024-01-07 ENCOUNTER — Inpatient Hospital Stay (HOSPITAL_BASED_OUTPATIENT_CLINIC_OR_DEPARTMENT_OTHER): Payer: 59 | Admitting: Oncology

## 2024-01-07 VITALS — BP 115/78 | HR 85 | Resp 18

## 2024-01-07 VITALS — BP 114/84 | HR 93 | Temp 96.0°F | Resp 18 | Wt 140.2 lb

## 2024-01-07 DIAGNOSIS — G620 Drug-induced polyneuropathy: Secondary | ICD-10-CM

## 2024-01-07 DIAGNOSIS — D122 Benign neoplasm of ascending colon: Secondary | ICD-10-CM | POA: Diagnosis not present

## 2024-01-07 DIAGNOSIS — E039 Hypothyroidism, unspecified: Secondary | ICD-10-CM | POA: Diagnosis not present

## 2024-01-07 DIAGNOSIS — I129 Hypertensive chronic kidney disease with stage 1 through stage 4 chronic kidney disease, or unspecified chronic kidney disease: Secondary | ICD-10-CM | POA: Diagnosis not present

## 2024-01-07 DIAGNOSIS — Z5111 Encounter for antineoplastic chemotherapy: Secondary | ICD-10-CM

## 2024-01-07 DIAGNOSIS — C20 Malignant neoplasm of rectum: Secondary | ICD-10-CM

## 2024-01-07 DIAGNOSIS — Z9221 Personal history of antineoplastic chemotherapy: Secondary | ICD-10-CM | POA: Diagnosis not present

## 2024-01-07 DIAGNOSIS — Z923 Personal history of irradiation: Secondary | ICD-10-CM | POA: Diagnosis not present

## 2024-01-07 DIAGNOSIS — E1140 Type 2 diabetes mellitus with diabetic neuropathy, unspecified: Secondary | ICD-10-CM | POA: Diagnosis not present

## 2024-01-07 DIAGNOSIS — K521 Toxic gastroenteritis and colitis: Secondary | ICD-10-CM

## 2024-01-07 DIAGNOSIS — F17210 Nicotine dependence, cigarettes, uncomplicated: Secondary | ICD-10-CM | POA: Diagnosis not present

## 2024-01-07 DIAGNOSIS — R197 Diarrhea, unspecified: Secondary | ICD-10-CM | POA: Diagnosis not present

## 2024-01-07 DIAGNOSIS — Z79899 Other long term (current) drug therapy: Secondary | ICD-10-CM | POA: Diagnosis not present

## 2024-01-07 DIAGNOSIS — I700 Atherosclerosis of aorta: Secondary | ICD-10-CM | POA: Diagnosis not present

## 2024-01-07 DIAGNOSIS — N189 Chronic kidney disease, unspecified: Secondary | ICD-10-CM | POA: Diagnosis not present

## 2024-01-07 DIAGNOSIS — G4730 Sleep apnea, unspecified: Secondary | ICD-10-CM | POA: Diagnosis not present

## 2024-01-07 DIAGNOSIS — N200 Calculus of kidney: Secondary | ICD-10-CM | POA: Diagnosis not present

## 2024-01-07 DIAGNOSIS — E1122 Type 2 diabetes mellitus with diabetic chronic kidney disease: Secondary | ICD-10-CM | POA: Diagnosis not present

## 2024-01-07 DIAGNOSIS — Z87442 Personal history of urinary calculi: Secondary | ICD-10-CM | POA: Diagnosis not present

## 2024-01-07 DIAGNOSIS — E785 Hyperlipidemia, unspecified: Secondary | ICD-10-CM | POA: Diagnosis not present

## 2024-01-07 DIAGNOSIS — T451X5A Adverse effect of antineoplastic and immunosuppressive drugs, initial encounter: Secondary | ICD-10-CM

## 2024-01-07 DIAGNOSIS — K219 Gastro-esophageal reflux disease without esophagitis: Secondary | ICD-10-CM | POA: Diagnosis not present

## 2024-01-07 LAB — CBC WITH DIFFERENTIAL (CANCER CENTER ONLY)
Abs Immature Granulocytes: 0.01 10*3/uL (ref 0.00–0.07)
Basophils Absolute: 0 10*3/uL (ref 0.0–0.1)
Basophils Relative: 1 %
Eosinophils Absolute: 0.1 10*3/uL (ref 0.0–0.5)
Eosinophils Relative: 3 %
HCT: 43.5 % (ref 36.0–46.0)
Hemoglobin: 14.7 g/dL (ref 12.0–15.0)
Immature Granulocytes: 0 %
Lymphocytes Relative: 20 %
Lymphs Abs: 0.8 10*3/uL (ref 0.7–4.0)
MCH: 32.3 pg (ref 26.0–34.0)
MCHC: 33.8 g/dL (ref 30.0–36.0)
MCV: 95.6 fL (ref 80.0–100.0)
Monocytes Absolute: 0.5 10*3/uL (ref 0.1–1.0)
Monocytes Relative: 12 %
Neutro Abs: 2.5 10*3/uL (ref 1.7–7.7)
Neutrophils Relative %: 64 %
Platelet Count: 152 10*3/uL (ref 150–400)
RBC: 4.55 MIL/uL (ref 3.87–5.11)
RDW: 15.1 % (ref 11.5–15.5)
WBC Count: 3.9 10*3/uL — ABNORMAL LOW (ref 4.0–10.5)
nRBC: 0 % (ref 0.0–0.2)

## 2024-01-07 LAB — CMP (CANCER CENTER ONLY)
ALT: 13 U/L (ref 0–44)
AST: 19 U/L (ref 15–41)
Albumin: 4 g/dL (ref 3.5–5.0)
Alkaline Phosphatase: 74 U/L (ref 38–126)
Anion gap: 10 (ref 5–15)
BUN: 16 mg/dL (ref 6–20)
CO2: 24 mmol/L (ref 22–32)
Calcium: 8.9 mg/dL (ref 8.9–10.3)
Chloride: 104 mmol/L (ref 98–111)
Creatinine: 0.94 mg/dL (ref 0.44–1.00)
GFR, Estimated: >60 mL/min (ref >60)
Glucose, Bld: 175 mg/dL — ABNORMAL HIGH (ref 70–99)
Potassium: 3.9 mmol/L (ref 3.5–5.1)
Sodium: 138 mmol/L (ref 135–145)
Total Bilirubin: 0.5 mg/dL (ref 0.0–1.2)
Total Protein: 7 g/dL (ref 6.5–8.1)

## 2024-01-07 LAB — PROTEIN, URINE, RANDOM: Total Protein, Urine: 11 mg/dL

## 2024-01-07 MED ORDER — DEXAMETHASONE 4 MG PO TABS: 8.0000 mg | ORAL_TABLET | ORAL | 1 refills | Status: DC

## 2024-01-07 MED ORDER — DEXAMETHASONE SODIUM PHOSPHATE 10 MG/ML IJ SOLN
10.0000 mg | Freq: Once | INTRAMUSCULAR | Status: AC
  Administered 2024-01-07: 10 mg via INTRAVENOUS
  Filled 2024-01-07: qty 1

## 2024-01-07 MED ORDER — PALONOSETRON HCL INJECTION 0.25 MG/5ML
0.2500 mg | Freq: Once | INTRAVENOUS | Status: AC
  Administered 2024-01-07: 0.25 mg via INTRAVENOUS
  Filled 2024-01-07: qty 5

## 2024-01-07 MED ORDER — SODIUM CHLORIDE 0.9 % IV SOLN
180.0000 mg/m2 | Freq: Once | INTRAVENOUS | Status: AC
  Administered 2024-01-07: 300 mg via INTRAVENOUS
  Filled 2024-01-07: qty 15

## 2024-01-07 MED ORDER — SODIUM CHLORIDE 0.9 % IV SOLN
400.0000 mg/m2 | Freq: Once | INTRAVENOUS | Status: AC
  Administered 2024-01-07: 644 mg via INTRAVENOUS
  Filled 2024-01-07: qty 32.2

## 2024-01-07 MED ORDER — SODIUM CHLORIDE 0.9 % IV SOLN
Freq: Once | INTRAVENOUS | Status: AC
  Filled 2024-01-07: qty 250

## 2024-01-07 MED ORDER — SODIUM CHLORIDE 0.9 % IV SOLN
2400.0000 mg/m2 | INTRAVENOUS | Status: DC
  Administered 2024-01-07: 3850 mg via INTRAVENOUS
  Filled 2024-01-07: qty 77

## 2024-01-07 MED ORDER — PROCHLORPERAZINE EDISYLATE 10 MG/2ML IJ SOLN
10.0000 mg | Freq: Once | INTRAMUSCULAR | Status: DC
Start: 2024-01-07 — End: 2024-01-07

## 2024-01-07 MED ORDER — BEVACIZUMAB-AWWB CHEMO INJECTION 100 MG/4ML
5.0000 mg/kg | Freq: Once | INTRAVENOUS | Status: AC
  Administered 2024-01-07: 300 mg via INTRAVENOUS
  Filled 2024-01-07: qty 12

## 2024-01-07 MED ORDER — FLUOROURACIL CHEMO INJECTION 2.5 GM/50ML
400.0000 mg/m2 | Freq: Once | INTRAVENOUS | Status: AC
  Administered 2024-01-07: 650 mg via INTRAVENOUS
  Filled 2024-01-07: qty 13

## 2024-01-07 NOTE — Assessment & Plan Note (Signed)
History of Stage III. pT3 pN1b cM0, s/p APR. 05/2023 Stage IV current rectal adenocarcinoma Currently on adjuvant chemotherapy. S/p FOLFOX x 4 cycles S/p concurrent chemotherapy [Xeloda 1300 mg BID] with RT, and then another 4 cycles of adjuvant FOLFOX [dose reduced oxaliplatin and omit 5-FU bolus 05/2023 CT imaging indicates disease progression. liver biopsy pathology showed metastatic carcinoma--> FOLFIRI + Bevacizumab   [facility] NGS showed TP53 missense variant, APC frameshift, TCF7L2 Frameshift, FLT3 copy number gain., TMB 4.7, pMMR Wildtype KRAS/NRAS Labs are reviewed and discussed with patient. Proceed with FOLFIRI + Bevacizumab

## 2024-01-07 NOTE — Assessment & Plan Note (Signed)
 Grade 2  Continue gabapentin 300mg  BID.

## 2024-01-07 NOTE — Progress Notes (Signed)
 Hematology/Oncology Progress note [phone] [phone]      CHIEF COMPLAINTS/REASON FOR VISIT:  Follow-up for Stage IV  rectal cancer treatments.  ASSESSMENT & PLAN:   Cancer Staging  Rectal cancer [facility]) Staging form: Colon and Rectum, AJCC 8th Edition - Pathologic stage from 05/03/2022: Stage IIIB (pT3, pN1b, cM0) - Signed by Babara Call, MD on 05/03/2022 - Pathologic stage from 06/18/2023: Stage IVA (rpTX, pN0, pM1a) - Signed by Babara Call, MD on 06/18/2023   Rectal cancer [facility]) History of Stage III. pT3 pN1b cM0, s/p APR. 05/2023 Stage IV current rectal adenocarcinoma Currently on adjuvant chemotherapy. S/p FOLFOX x 4 cycles S/p concurrent chemotherapy [Xeloda  1300 mg BID] with RT, and then another 4 cycles of adjuvant FOLFOX [dose reduced oxaliplatin  and omit 5-FU bolus 05/2023 CT imaging indicates disease progression. liver biopsy pathology showed metastatic carcinoma--> FOLFIRI + Bevacizumab    [employer] NGS showed TP53 missense variant, APC frameshift, TCF7L2 Frameshift, FLT3 copy number gain., TMB 4.7, pMMR Wildtype KRAS/NRAS Labs are reviewed and discussed with patient. Proceed with FOLFIRI + Bevacizumab      Chemotherapy induced diarrhea Recommend Imodium  PRN as directed.   Chemotherapy-induced neuropathy (HCC) Grade 2  Continue gabapentin  300mg  BID.   Encounter for antineoplastic chemotherapy Chemotherapy plan as listed above   Orders Placed This Encounter  Procedures   CEA    Standing Status:   Future    Expected Date:   02/04/2024    Expiration Date:   02/03/2025   Protein, urine, random    Standing Status:   Future    Expected Date:   02/04/2024    Expiration Date:   02/03/2025   CBC with Differential (Cancer Center Only)    Standing Status:   Future    Expected Date:   02/04/2024    Expiration Date:   02/03/2025   CMP (Cancer Center only)    Standing Status:   Future    Expected Date:   02/04/2024    Expiration Date:   02/03/2025     Follow-up  2 weeks   All questions were answered. The patient knows to call the clinic with any problems, questions or concerns.  Call Babara, MD, PhD [facility] Hematology Oncology 01/07/2024      HISTORY OF PRESENTING ILLNESS:   April Luna is a  53 y.o.  female presents for treatment of rectal cancer Oncology History  Rectal cancer (HCC)  02/26/2022 Imaging   MRI PELVIS WITHOUT CONTRAST- By imaging, rectal cancer stage:  T1/T2, N0, Mx    03/02/2022 Imaging   CT CHEST AND ABDOMEN WITH CONTRAST 1. No convincing evidence of metastatic disease within the chest or abdomen. 2. Atrophic left kidney with multifocal renal scarring and cortical calcifications as well as nonobstructive renal stones measuring up to 5 mm. 3. Prominent left-sided predominant retroperitoneal lymph nodes measuring up to 8 mm near the level of the renal hilum, overall decreased in size dating back to CT September 19, 2018 and favored reactive related to left renal inflammation. 4.  Aortic Atherosclerosis (ICD10-I70.0).   03/27/2022 Genetic Testing    [employer] CustomNext+RNA cancer panel found no pathogenic mutations.    04/21/2022 Initial Diagnosis   Rectal cancer - baseline CEA 3.6 -02/09/2022, patient had colonoscopy which showed renal mass 10 cm from anal verge.  5 mm polyp in ascending colon.  Removed and retrieved. Pathology showed rectal adenocarcinoma.  The polyp in the ascending colon is a tubular adenoma.  MRI showed cT1/T2N0 disease  -04/21/2022, patient underwent robotic  assisted ultralow anterior resection. Pathology showed moderately differentiated adenocarcinoma, 4.5 cm in maximal extent, with focal extension through muscularis propria into perirectal soft tissue.  3 lymph nodes positive for metastatic carcinoma.  Negative margin.  pT3 pN1b, MSI stable.   05/03/2022 Cancer Staging   Staging form: Colon and Rectum, AJCC 8th Edition - Pathologic stage from 05/03/2022: Stage IIIB (pT3, pN1b, cM0)  - Signed by Babara Call, MD on 05/03/2022 Stage prefix: Initial diagnosis   05/11/2022 Miscellaneous   Medi port placed by Dr.White   05/26/2022 -  Chemotherapy   FOLFOX Q2 weeks x 4   05/26/2022 - 07/09/2022 Chemotherapy   Patient is on Treatment Plan : COLORECTAL FOLFOX q14d x 8 cycles     05/26/2022 - 12/13/2022 Chemotherapy   Patient is on Treatment Plan : COLORECTAL FOLFOX q14d x 4 months     07/27/2022 -  Chemotherapy   Concurrent chemotherapy- Xeloda   1300mg  BID and radiation.    07/27/2022 - 09/12/2022 Radiation Therapy    concurrent chemotherapy [Xeloda  1300 mg BID] with RT    09/20/2022 Imaging   CT Angiogram chest PET protocol There is no evidence of pulmonary artery embolism. There is no evidence of thoracic aortic dissection.   Small linear patchy alveolar infiltrate is seen in medial segment of right middle lobe suggesting atelectasis/pneumonia.     09/28/2022 - 09/30/2022 Hospital Admission   Admission due to acute respiratory failure with hypoxia, COPD exacerbation   09/28/2022 Imaging   CT angio chest PE protocol 1. Negative for acute PE or thoracic aortic dissection. 2. New ground-glass infiltrates anteriorly in bilateral upper lobes,may represent atypical edema, infectious or inflammatory process. 3.  Aortic Atherosclerosis     12/22/2022 Imaging   CT chest abdomen pelvis w contrast  Stable exam. No evidence of recurrent or metastatic carcinoma within the chest, abdomen, or pelvis.   Left nephrolithiasis and renal parenchymal atrophy. No evidence of ureteral calculi or hydronephrosis.   Aortic Atherosclerosis   05/31/2023 Imaging   CT chest abdomen pelvis with contrast showed 1. New hypodense 16 mm lesion in the right lobe of the liver with very subtle adjacent 5 mm lesion, suspicious for metastatic disease. 2. Small bilateral pulmonary nodules, some of which were subtly evident on prior examination only in retrospect, some of which demonstrate degree of cavitation,  suspicious for metastatic disease. 3. Prominent retroperitoneal lymph nodes are similar prior. 4. Prior low anterior resection with Hartmann's pouch formation,similar small amount of perirectal/presacral soft tissue and fluid,favored postsurgical/posttreatment change. Suggest continued attention on follow-up imaging. 5. Large volume of formed stool in the colon. 6. Nonobstructive left renal calculi measure under 1 cm.     06/08/2023 Relapse/Recurrence   Ultrasound-guided liver biopsy showed Pathology showed metastatic carcinoma, morphology consistent with patient's clinical history of rectal adenocarcinoma.  [employer] NGS 648 gene panel showed TP53 missense variant, APC frameshift, TCF7L2 frameshift, FLT3 copy number gain, TMB 4.77m/MB MSI stable.  Wildtype KRAS/NRAS  [employer] RNA Seq - ERBB3 overexpressed, VEGFA overexpressed, NRAS overexpressed.     06/18/2023 Cancer Staging   Staging form: Colon and Rectum, AJCC 8th Edition - Pathologic stage from 06/18/2023: Stage IVA (rpTX, pN0, pM1a) - Signed by Babara Call, MD on 06/18/2023 Stage prefix: Recurrence Total positive nodes: 0   06/25/2023 - 06/25/2023 Chemotherapy   Patient is on Treatment Plan : COLORECTAL FOLFOX + Bevacizumab  q14d     07/02/2023 -  Chemotherapy   Patient is on Treatment Plan : COLORECTAL FOLFIRI + Bevacizumab  q14d  11/13/2023 Imaging   CT chest abdomen pelvis w contrast showed  1. Increase in cavitation with decreased soft tissue involving the scattered bilateral pulmonary nodules, consistent with treatment response. No new suspicious pulmonary nodules or masses. 2. Decrease in size and conspicuity of the hypodense lesions in the right lobe of the liver, consistent with treatment response. No new suspicious hepatic lesion identified. 3. Stable prominent nonspecific retroperitoneal lymph nodes. Suggest continued attention on follow-up imaging. 4. Similar surgical changes of prior low anterior resection with left  anterior abdominal wall colostomy. Similar mesorectal/presacral soft tissue, no new suspicious enhancing nodularity. 5. Similar soft tissue nodularity in the bilateral posterior gluteal subcutaneous soft tissues. 6. Moderate volume of formed stool in the colon. Correlate for constipation.   Patient has bipolar and schizophrenia..  Patient is married  She has housing issue due to previous history of eviction.  she does not live with her husband currently. She lives with some family members.   INTERVAL HISTORY April Luna is a 53 y.o. female who has above history reviewed by me today presents for follow up visit for rectal cancer.  She feels well today.  Denies any melena or blood in the stool. Stable neuropathy symptoms. Diarrhea managed by anti diarrhea medication + chronic dysuria and urgency she was seen by Urology and was started on Gemteza.  Symptoms are improved.     Review of Systems  Constitutional:  Positive for fatigue. Negative for appetite change, chills and fever.  HENT:   Negative for hearing loss, mouth sores and voice change.   Eyes:  Negative for eye problems.  Respiratory:  Negative for chest tightness, cough and shortness of breath.   Cardiovascular:  Negative for chest pain.  Gastrointestinal:  Negative for abdominal distention, abdominal pain, blood in stool, diarrhea and nausea.       + colostomy  Endocrine: Negative for hot flashes.  Genitourinary:  Positive for dysuria and frequency. Negative for difficulty urinating.   Musculoskeletal:  Negative for arthralgias.  Skin:  Negative for itching and rash.  Neurological:  Positive for numbness. Negative for extremity weakness.  Hematological:  Negative for adenopathy.  Psychiatric/Behavioral:  Negative for confusion.     MEDICAL HISTORY:  Past Medical History:  Diagnosis Date   Allergy    pollen   Anxiety    Arthritis    right hip   Bipolar 1 disorder (HCC)    Cancer (HCC)    rectal    Chemotherapy-induced neuropathy (HCC) 10/24/2022   Chronic kidney disease    COPD (chronic obstructive pulmonary disease) (HCC)    Depression    Family history of adverse reaction to anesthesia    grand father had a stroke during anesthesia   Family history of breast cancer    Family history of colon cancer    Family history of uterine cancer    GERD (gastroesophageal reflux disease)    History of kidney stones    Hyperlipidemia    Hypertension    Hypothyroidism    Panic attack    Pneumonia    Psoriasis    Sleep apnea 08/11/2021   No CPAP   Type 2 diabetes mellitus with microalbuminuria, without long-term current use of insulin  (HCC) 06/24/2019    SURGICAL HISTORY: Past Surgical History:  Procedure Laterality Date   BREAST BIOPSY Left 12/14/2021   us  bx, venus marker, path pending   CESAREAN SECTION     COLONOSCOPY WITH PROPOFOL  N/A 02/09/2022   Procedure: COLONOSCOPY WITH PROPOFOL ;  Surgeon:  Unk Corinn Skiff, MD;  Location: [facility];  Service: Endoscopy;  Laterality: N/A;  sleep apnea   CYSTOSCOPY W/ RETROGRADES Left 11/08/2018   Procedure: CYSTOSCOPY WITH RETROGRADE PYELOGRAM;  Surgeon: Francisca Redell BROCKS, MD;  Location: [facility];  Service: Urology;  Laterality: Left;   CYSTOSCOPY/URETEROSCOPY/HOLMIUM LASER/STENT PLACEMENT Left 11/08/2018   Procedure: CYSTOSCOPY/URETEROSCOPY/HOLMIUM LASER/STENT PLACEMENT;  Surgeon: Francisca Redell BROCKS, MD;  Location: [facility];  Service: Urology;  Laterality: Left;   IR CV LINE INJECTION  06/25/2023   IR IMAGING GUIDED PORT INSERTION  05/11/2022   IR REMOVE CV FIBRIN SHEATH  06/27/2023   MOUTH SURGERY     wisdom teeth extraction   MOUTH SURGERY     teeth removal   POLYPECTOMY N/A 02/09/2022   Procedure: POLYPECTOMY;  Surgeon: Unk Corinn Skiff, MD;  Location: [facility];  Service: Endoscopy;  Laterality: N/A;   XI ROBOTIC ASSISTED LOWER ANTERIOR RESECTION N/A 04/21/2022   Procedure: XI ROBOTIC ASSISTED LOWER ANTERIOR  RESECTION WITH COLOSTOMY, BILATERAL TAP BLOCK, ASSESSMENT OF TISSUE PERFUSSION WITH FIREFLY INJECTION;  Surgeon: Teresa Lonni HERO, MD;  Location: [facility];  Service: General;  Laterality: N/A;    SOCIAL HISTORY: Social History   Socioeconomic History   Marital status: Married    Spouse name: Morene Jama Pereyra   Number of children: 1   Years of education: 12   Highest education level: High school graduate  Occupational History   Occupation: unemployed    Comment: disabled  Tobacco Use   Smoking status: Some Days    Current packs/day: 0.25    Average packs/day: 0.3 packs/day for 38.7 years (9.7 ttl pk-yrs)    Types: Cigarettes    Start date: 05/13/1985    Passive exposure: Current   Smokeless tobacco: Never   Tobacco comments:    5-6 cigarettes weekly- 11/07/2022  Vaping Use   Vaping status: Former   Start date: 05/25/2018   Quit date: 09/24/2018  Substance and Sexual Activity   Alcohol use: Not Currently    Alcohol/week: 4.0 standard drinks of alcohol    Types: 4 Glasses of wine per week    Comment: quit ETOH in Feb. 2023   Drug use: Yes    Types: Marijuana    Comment: 2 days ago   Sexual activity: Not Currently    Birth control/protection: Post-menopausal  Other Topics Concern   Not on file  Social History Narrative   Lives with husband and son    Social Drivers of Health   Financial Resource Strain: High Risk (08/30/2023)   Overall Financial Resource Strain (CARDIA)    Difficulty of Paying Living Expenses: Hard  Food Insecurity: Food Insecurity Present (08/30/2023)   Hunger Vital Sign    Worried About Running Out of Food in the Last Year: Sometimes true    Ran Out of Food in the Last Year: Sometimes true  Transportation Needs: No Transportation Needs (08/30/2023)   PRAPARE - Administrator, Civil Service (Medical): No    Lack of Transportation (Non-Medical): No  Physical Activity: Inactive (08/30/2023)   Exercise Vital Sign    Days of Exercise per Week: 0  days    Minutes of Exercise per Session: 0 min  Stress: Stress Concern Present (08/30/2023)   [employer] of Occupational Health - Occupational Stress Questionnaire    Feeling of Stress : Very much  Social Connections: Moderately Integrated (08/30/2023)   Social Connection and Isolation Panel [NHANES]    Frequency of Communication with Friends and  Family: Never    Frequency of Social Gatherings with Friends and Family: Once a week    Attends Religious Services: More than 4 times per year    Active Member of [employer] or Organizations: No    Attends Engineer, Structural: 1 to 4 times per year    Marital Status: Married  Catering Manager Violence: Not At Risk (08/30/2023)   Humiliation, Afraid, Rape, and Kick questionnaire    Fear of Current or Ex-Partner: No    Emotionally Abused: No    Physically Abused: No    Sexually Abused: No    FAMILY HISTORY: Family History  Problem Relation Age of Onset   Depression Mother    Anxiety disorder Mother    Diabetes Mother    Hypertension Mother    Hyperlipidemia Mother    Cancer Mother    Uterine cancer Mother 67   Cervical cancer Mother 32   Colon cancer Father    Depression Brother    Anxiety disorder Brother    Cancer Maternal Aunt        unk types   Diabetes Mellitus II Maternal Grandmother    Hypercholesterolemia Maternal Grandmother    Breast cancer Maternal Grandmother    Cancer Paternal Grandmother    Diabetes Paternal Grandmother    Melanoma Paternal Grandmother    Stomach cancer Paternal Grandmother     ALLERGIES:  is allergic to metformin and related, nsaids, perphenazine, sulfa antibiotics, abilify [aripiprazole], and penicillins.  MEDICATIONS:  Current Outpatient Medications  Medication Sig Dispense Refill   acetaminophen  (TYLENOL ) 325 MG tablet Take 650 mg by mouth every 6 (six) hours as needed for headache (pain).     albuterol  (VENTOLIN  HFA) 108 (90 Base) MCG/ACT inhaler Inhale 2 puffs into the lungs every 6  (six) hours as needed for wheezing or shortness of breath. 1 each 5   amantadine  (SYMMETREL ) 100 MG capsule Take 100 mg by mouth 2 (two) times daily.     blood glucose meter kit and supplies Dispense based on patient and insurance preference. Use up to four times daily as directed. (FOR ICD-10 E10.9, E11.9). 1 each 0   Budeson-Glycopyrrol-Formoterol  (BREZTRI  AEROSPHERE) 160-9-4.8 MCG/ACT AERO Inhale 2 puffs into the lungs in the morning and at bedtime. 5.9 g 1   buPROPion  (WELLBUTRIN  XL) 300 MG 24 hr tablet Take 300 mg by mouth daily.     Cholecalciferol  (VITAMIN D -3) 125 MCG (5000 UT) TABS Take 5,000 Units by mouth daily. 30 tablet 1   clonazePAM (KLONOPIN) 0.5 MG tablet Take 1 mg by mouth 2 (two) times daily as needed.     dapagliflozin  propanediol (FARXIGA ) 10 MG TABS tablet TAKE 1 TABLET BY MOUTH DAILY BEFORE BREAKFAST 90 tablet 0   diltiazem  (CARDIZEM  CD) 120 MG 24 hr capsule TAKE 1 CAPSULE BY MOUTH DAILY 90 capsule 1   gabapentin  (NEURONTIN ) 300 MG capsule Take 300 mg by mouth 2 (two) times daily.     icosapent  Ethyl (VASCEPA ) 1 g capsule TAKE 2 CAPSULES BY MOUTH 2 TIMES DAILY 120 capsule 3   levothyroxine  (SYNTHROID ) 75 MCG tablet TAKE 1 TABLET BY MOUTH DAILY BEFORE BREAKFAST 90 tablet 2   lidocaine -prilocaine  (EMLA ) cream Apply to affected area once 30 g 3   loperamide  (IMODIUM ) 2 MG capsule Take 1 capsule (2 mg total) by mouth See admin instructions. Initial: 4 mg,the 2 mg every 2 hours (4 mg every 4 hours at night)  maximum: 16 mg/day 60 capsule 2   loratadine  (CLARITIN ) 10 MG  tablet Take 10 mg by mouth every morning.     magic mouthwash w/lidocaine  SOLN Take 5 mLs by mouth 4 (four) times daily as needed for mouth pain. Sig: Swish/Swallow 5-10 ml four times a day as needed. Dispense 480 ml. 1RF 480 mL 1   ondansetron  (ZOFRAN ) 8 MG tablet Take 1 tablet (8 mg total) by mouth every 8 (eight) hours as needed for nausea, vomiting or refractory nausea / vomiting. Start on the third day after  chemotherapy. 30 tablet 1   paliperidone  (INVEGA  SUSTENNA) 234 MG/1.5ML SUSY injection Inject 234 mg into the muscle every 30 (thirty) days. On or about the 14th of each month     pantoprazole  (PROTONIX ) 40 MG tablet TAKE 1 TABLET BY MOUTH DAILY 90 tablet 2   pioglitazone  (ACTOS ) 15 MG tablet Take 1 tablet (15 mg total) by mouth daily. 90 tablet 1   promethazine  (PHENERGAN ) 12.5 MG tablet Take 1 tablet (12.5 mg total) by mouth every 6 (six) hours as needed for nausea or vomiting. 30 tablet 0   rosuvastatin  (CRESTOR ) 40 MG tablet TAKE 1 TABLET BY MOUTH EVERY MORNING 30 tablet 3   sertraline  (ZOLOFT ) 100 MG tablet Take 200 mg by mouth every morning.     Spacer/Aero-Holding Chambers (AEROCHAMBER MV) inhaler Use as instructed 1 each 0   Vibegron  (GEMTESA ) 75 MG TABS Take 1 tablet (75 mg total) by mouth daily.     dexamethasone  (DECADRON ) 4 MG tablet Take 2 tablets (8 mg total) by mouth See admin instructions. Take 8mg  daily for 2 days after each chemotherapy. 30 tablet 1   No current facility-administered medications for this visit.   Facility-Administered Medications Ordered in Other Visits  Medication Dose Route Frequency Provider Last Rate Last Admin   albuterol  (PROVENTIL ) (2.5 MG/3ML) 0.083% nebulizer solution 2.5 mg  2.5 mg Nebulization Once Dgayli, Khabib, MD       bevacizumab -awwb (MVASI ) 300 mg in sodium chloride  0.9 % 100 mL chemo infusion  5 mg/kg (Treatment Plan Recorded) Intravenous Once Babara Call, MD       fluorouracil  (ADRUCIL ) 3,850 mg in sodium chloride  0.9 % 73 mL chemo infusion  2,400 mg/m2 (Treatment Plan Recorded) Intravenous 1 day or 1 dose Babara Call, MD       fluorouracil  (ADRUCIL ) chemo injection 650 mg  400 mg/m2 (Treatment Plan Recorded) Intravenous Once Terecia Plaut, MD       irinotecan  (CAMPTOSAR ) 300 mg in sodium chloride  0.9 % 500 mL chemo infusion  180 mg/m2 (Treatment Plan Recorded) Intravenous Once Babara Call, MD       leucovorin  644 mg in sodium chloride  0.9 % 250 mL  infusion  400 mg/m2 (Treatment Plan Recorded) Intravenous Once Babara Call, MD       sodium chloride  flush (NS) 0.9 % injection 10 mL  10 mL Intracatheter PRN Babara Call, MD   10 mL at 08/15/23 1327     PHYSICAL EXAMINATION: ECOG PERFORMANCE STATUS: 0 - Asymptomatic Vitals:   01/07/24 0833  BP: 114/84  Pulse: 93  Resp: 18  Temp: (!) 96 F (35.6 C)  SpO2: 95%    Filed Weights   01/07/24 0833  Weight: 140 lb 3.2 oz (63.6 kg)     Physical Exam Constitutional:      General: She is not in acute distress. HENT:     Head: Normocephalic and atraumatic.     Mouth/Throat:     Comments: mucositis Eyes:     General: No scleral icterus. Cardiovascular:  Rate and Rhythm: Normal rate and regular rhythm.  Pulmonary:     Effort: Pulmonary effort is normal. No respiratory distress.     Breath sounds: No wheezing.     Comments: Decreased breath sound bilaterally Abdominal:     General: Bowel sounds are normal. There is no distension.     Palpations: Abdomen is soft.     Comments: +colostomy   Musculoskeletal:        General: No deformity. Normal range of motion.     Cervical back: Normal range of motion and neck supple.  Skin:    General: Skin is warm and dry.     Findings: No erythema or rash.  Neurological:     Mental Status: She is alert and oriented to person, place, and time. Mental status is at baseline.     Cranial Nerves: No cranial nerve deficit.     Coordination: Coordination normal.  Psychiatric:        Mood and Affect: Mood normal.     LABORATORY DATA:  I have reviewed the data as listed    Latest Ref Rng & Units 01/07/2024    8:19 AM 12/24/2023   10:32 AM 12/03/2023   10:34 AM  CBC  WBC 4.0 - 10.5 K/uL 3.9  6.0  10.2   Hemoglobin 12.0 - 15.0 g/dL 85.2  85.9  85.6   Hematocrit 36.0 - 46.0 % 43.5  43.1  42.9   Platelets 150 - 400 K/uL 152  139  132       Latest Ref Rng & Units 01/07/2024    8:19 AM 12/24/2023   10:32 AM 12/03/2023   10:34 AM  CMP  Glucose  70 - 99 mg/dL 824  844  865   BUN 6 - 20 mg/dL 16  10  19    Creatinine 0.44 - 1.00 mg/dL 9.05  8.96  9.12   Sodium 135 - 145 mmol/L 138  137  135   Potassium 3.5 - 5.1 mmol/L 3.9  3.6  3.6   Chloride 98 - 111 mmol/L 104  101  101   CO2 22 - 32 mmol/L 24  27  25    Calcium  8.9 - 10.3 mg/dL 8.9  8.6  8.8   Total Protein 6.5 - 8.1 g/dL 7.0  6.9  7.2   Total Bilirubin 0.0 - 1.2 mg/dL 0.5  0.4  0.5   Alkaline Phos 38 - 126 U/L 74  67  69   AST 15 - 41 U/L 19  19  15    ALT 0 - 44 U/L 13  14  15          RADIOGRAPHIC STUDIES: I have personally reviewed the radiological images as listed and agreed with the findings in the report. No results found.

## 2024-01-07 NOTE — Patient Instructions (Signed)
 [facility] - A DEPT OF [facility]  Discharge Instructions: Thank you for choosing [facility] to provide your oncology and hematology care.  If you have a lab appointment with the Cancer Center, please go directly to the Cancer Center and check in at the registration area.  Wear comfortable clothing and clothing appropriate for easy access to any Portacath or PICC line.   We strive to give you quality time with your provider. You may need to reschedule your appointment if you arrive late (15 or more minutes).  Arriving late affects you and other patients whose appointments are after yours.  Also, if you miss three or more appointments without notifying the office, you may be dismissed from the clinic at the provider's discretion.      For prescription refill requests, have your pharmacy contact our office and allow 72 hours for refills to be completed.    Today you received the following chemotherapy and/or immunotherapy agents Irinotecan, Leucovorin, Mvasi, Adrucil       To help prevent nausea and vomiting after your treatment, we encourage you to take your nausea medication as directed.  BELOW ARE SYMPTOMS THAT SHOULD BE REPORTED IMMEDIATELY: *FEVER GREATER THAN 100.4 F (38 C) OR HIGHER *CHILLS OR SWEATING *NAUSEA AND VOMITING THAT IS NOT CONTROLLED WITH YOUR NAUSEA MEDICATION *UNUSUAL SHORTNESS OF BREATH *UNUSUAL BRUISING OR BLEEDING *URINARY PROBLEMS (pain or burning when urinating, or frequent urination) *BOWEL PROBLEMS (unusual diarrhea, constipation, pain near the anus) TENDERNESS IN MOUTH AND THROAT WITH OR WITHOUT PRESENCE OF ULCERS (sore throat, sores in mouth, or a toothache) UNUSUAL RASH, SWELLING OR PAIN  UNUSUAL VAGINAL DISCHARGE OR ITCHING   Items with * indicate a potential emergency and should be followed up as soon as possible or go to the Emergency Department if any problems should occur.  Please show the CHEMOTHERAPY  ALERT CARD or IMMUNOTHERAPY ALERT CARD at check-in to the Emergency Department and triage nurse.  Should you have questions after your visit or need to cancel or reschedule your appointment, please contact [facility] - A DEPT OF [facility]  [phone] and follow the prompts.  Office hours are 8:00 a.m. to 4:30 p.m. Monday - Friday. Please note that voicemails left after 4:00 p.m. may not be returned until the following business day.  We are closed weekends and major holidays. You have access to a nurse at all times for urgent questions. Please call the main number to the clinic [phone] and follow the prompts.  For any non-urgent questions, you may also contact your provider using MyChart. We now offer e-Visits for anyone 58 and older to request care online for non-urgent symptoms. For details visit [email or web address].   Also download the MyChart app! Go to the app store, search "MyChart", open the app, select [facility], and log in with your MyChart username and password.

## 2024-01-07 NOTE — Assessment & Plan Note (Signed)
 Chemotherapy plan as listed above

## 2024-01-07 NOTE — Assessment & Plan Note (Signed)
Recommend Imodium PRN as directed.

## 2024-01-08 LAB — CEA: CEA: 5 ng/mL — ABNORMAL HIGH (ref 0.0–4.7)

## 2024-01-09 ENCOUNTER — Inpatient Hospital Stay: Payer: 59

## 2024-01-09 VITALS — BP 138/90 | HR 86 | Resp 18

## 2024-01-09 DIAGNOSIS — G4730 Sleep apnea, unspecified: Secondary | ICD-10-CM | POA: Diagnosis not present

## 2024-01-09 DIAGNOSIS — N200 Calculus of kidney: Secondary | ICD-10-CM | POA: Diagnosis not present

## 2024-01-09 DIAGNOSIS — Z9221 Personal history of antineoplastic chemotherapy: Secondary | ICD-10-CM | POA: Diagnosis not present

## 2024-01-09 DIAGNOSIS — G620 Drug-induced polyneuropathy: Secondary | ICD-10-CM | POA: Diagnosis not present

## 2024-01-09 DIAGNOSIS — Z87442 Personal history of urinary calculi: Secondary | ICD-10-CM | POA: Diagnosis not present

## 2024-01-09 DIAGNOSIS — C20 Malignant neoplasm of rectum: Secondary | ICD-10-CM | POA: Diagnosis not present

## 2024-01-09 DIAGNOSIS — I700 Atherosclerosis of aorta: Secondary | ICD-10-CM | POA: Diagnosis not present

## 2024-01-09 DIAGNOSIS — E785 Hyperlipidemia, unspecified: Secondary | ICD-10-CM | POA: Diagnosis not present

## 2024-01-09 DIAGNOSIS — Z923 Personal history of irradiation: Secondary | ICD-10-CM | POA: Diagnosis not present

## 2024-01-09 DIAGNOSIS — T451X5A Adverse effect of antineoplastic and immunosuppressive drugs, initial encounter: Secondary | ICD-10-CM | POA: Diagnosis not present

## 2024-01-09 DIAGNOSIS — D122 Benign neoplasm of ascending colon: Secondary | ICD-10-CM | POA: Diagnosis not present

## 2024-01-09 DIAGNOSIS — Z5111 Encounter for antineoplastic chemotherapy: Secondary | ICD-10-CM | POA: Diagnosis not present

## 2024-01-09 DIAGNOSIS — F17210 Nicotine dependence, cigarettes, uncomplicated: Secondary | ICD-10-CM | POA: Diagnosis not present

## 2024-01-09 DIAGNOSIS — I129 Hypertensive chronic kidney disease with stage 1 through stage 4 chronic kidney disease, or unspecified chronic kidney disease: Secondary | ICD-10-CM | POA: Diagnosis not present

## 2024-01-09 DIAGNOSIS — Z79899 Other long term (current) drug therapy: Secondary | ICD-10-CM | POA: Diagnosis not present

## 2024-01-09 DIAGNOSIS — N189 Chronic kidney disease, unspecified: Secondary | ICD-10-CM | POA: Diagnosis not present

## 2024-01-09 DIAGNOSIS — R197 Diarrhea, unspecified: Secondary | ICD-10-CM | POA: Diagnosis not present

## 2024-01-09 DIAGNOSIS — E1140 Type 2 diabetes mellitus with diabetic neuropathy, unspecified: Secondary | ICD-10-CM | POA: Diagnosis not present

## 2024-01-09 DIAGNOSIS — K219 Gastro-esophageal reflux disease without esophagitis: Secondary | ICD-10-CM | POA: Diagnosis not present

## 2024-01-09 DIAGNOSIS — E1122 Type 2 diabetes mellitus with diabetic chronic kidney disease: Secondary | ICD-10-CM | POA: Diagnosis not present

## 2024-01-09 DIAGNOSIS — E039 Hypothyroidism, unspecified: Secondary | ICD-10-CM | POA: Diagnosis not present

## 2024-01-09 MED ORDER — SODIUM CHLORIDE 0.9% FLUSH
10.0000 mL | INTRAVENOUS | Status: DC | PRN
  Administered 2024-01-09: 10 mL
  Filled 2024-01-09: qty 10

## 2024-01-09 MED ORDER — HEPARIN SOD (PORK) LOCK FLUSH 100 UNIT/ML IV SOLN
500.0000 [IU] | Freq: Once | INTRAVENOUS | Status: AC | PRN
Start: 2024-01-09 — End: 2024-01-09
  Administered 2024-01-09: 500 [IU]
  Filled 2024-01-09: qty 5

## 2024-01-11 ENCOUNTER — Other Ambulatory Visit: Payer: Self-pay | Admitting: Oncology

## 2024-01-21 ENCOUNTER — Inpatient Hospital Stay (HOSPITAL_BASED_OUTPATIENT_CLINIC_OR_DEPARTMENT_OTHER): Payer: 59 | Admitting: Oncology

## 2024-01-21 ENCOUNTER — Inpatient Hospital Stay: Payer: 59

## 2024-01-21 ENCOUNTER — Encounter: Payer: Self-pay | Admitting: Oncology

## 2024-01-21 ENCOUNTER — Telehealth: Payer: Self-pay

## 2024-01-21 VITALS — BP 124/88 | HR 103 | Temp 96.2°F | Resp 18 | Wt 136.9 lb

## 2024-01-21 DIAGNOSIS — C20 Malignant neoplasm of rectum: Secondary | ICD-10-CM

## 2024-01-21 DIAGNOSIS — I129 Hypertensive chronic kidney disease with stage 1 through stage 4 chronic kidney disease, or unspecified chronic kidney disease: Secondary | ICD-10-CM | POA: Diagnosis not present

## 2024-01-21 DIAGNOSIS — G4730 Sleep apnea, unspecified: Secondary | ICD-10-CM | POA: Diagnosis not present

## 2024-01-21 DIAGNOSIS — G620 Drug-induced polyneuropathy: Secondary | ICD-10-CM | POA: Diagnosis not present

## 2024-01-21 DIAGNOSIS — T451X5A Adverse effect of antineoplastic and immunosuppressive drugs, initial encounter: Secondary | ICD-10-CM

## 2024-01-21 DIAGNOSIS — N189 Chronic kidney disease, unspecified: Secondary | ICD-10-CM | POA: Diagnosis not present

## 2024-01-21 DIAGNOSIS — E785 Hyperlipidemia, unspecified: Secondary | ICD-10-CM | POA: Diagnosis not present

## 2024-01-21 DIAGNOSIS — Z720 Tobacco use: Secondary | ICD-10-CM | POA: Diagnosis not present

## 2024-01-21 DIAGNOSIS — Z923 Personal history of irradiation: Secondary | ICD-10-CM | POA: Diagnosis not present

## 2024-01-21 DIAGNOSIS — N200 Calculus of kidney: Secondary | ICD-10-CM | POA: Diagnosis not present

## 2024-01-21 DIAGNOSIS — F17210 Nicotine dependence, cigarettes, uncomplicated: Secondary | ICD-10-CM | POA: Diagnosis not present

## 2024-01-21 DIAGNOSIS — Z79899 Other long term (current) drug therapy: Secondary | ICD-10-CM | POA: Diagnosis not present

## 2024-01-21 DIAGNOSIS — K219 Gastro-esophageal reflux disease without esophagitis: Secondary | ICD-10-CM | POA: Diagnosis not present

## 2024-01-21 DIAGNOSIS — E1140 Type 2 diabetes mellitus with diabetic neuropathy, unspecified: Secondary | ICD-10-CM | POA: Diagnosis not present

## 2024-01-21 DIAGNOSIS — Z87442 Personal history of urinary calculi: Secondary | ICD-10-CM | POA: Diagnosis not present

## 2024-01-21 DIAGNOSIS — I700 Atherosclerosis of aorta: Secondary | ICD-10-CM | POA: Diagnosis not present

## 2024-01-21 DIAGNOSIS — J441 Chronic obstructive pulmonary disease with (acute) exacerbation: Secondary | ICD-10-CM

## 2024-01-21 DIAGNOSIS — Z9221 Personal history of antineoplastic chemotherapy: Secondary | ICD-10-CM | POA: Diagnosis not present

## 2024-01-21 DIAGNOSIS — Z5111 Encounter for antineoplastic chemotherapy: Secondary | ICD-10-CM

## 2024-01-21 DIAGNOSIS — R197 Diarrhea, unspecified: Secondary | ICD-10-CM | POA: Diagnosis not present

## 2024-01-21 DIAGNOSIS — E039 Hypothyroidism, unspecified: Secondary | ICD-10-CM | POA: Diagnosis not present

## 2024-01-21 DIAGNOSIS — D122 Benign neoplasm of ascending colon: Secondary | ICD-10-CM | POA: Diagnosis not present

## 2024-01-21 DIAGNOSIS — E1122 Type 2 diabetes mellitus with diabetic chronic kidney disease: Secondary | ICD-10-CM | POA: Diagnosis not present

## 2024-01-21 LAB — CBC WITH DIFFERENTIAL (CANCER CENTER ONLY)
Abs Immature Granulocytes: 0.02 10*3/uL (ref 0.00–0.07)
Basophils Absolute: 0 10*3/uL (ref 0.0–0.1)
Basophils Relative: 0 %
Eosinophils Absolute: 0.1 10*3/uL (ref 0.0–0.5)
Eosinophils Relative: 3 %
HCT: 43.3 % (ref 36.0–46.0)
Hemoglobin: 14.3 g/dL (ref 12.0–15.0)
Immature Granulocytes: 0 %
Lymphocytes Relative: 13 %
Lymphs Abs: 0.7 10*3/uL (ref 0.7–4.0)
MCH: 31.8 pg (ref 26.0–34.0)
MCHC: 33 g/dL (ref 30.0–36.0)
MCV: 96.4 fL (ref 80.0–100.0)
Monocytes Absolute: 0.6 10*3/uL (ref 0.1–1.0)
Monocytes Relative: 11 %
Neutro Abs: 3.6 10*3/uL (ref 1.7–7.7)
Neutrophils Relative %: 73 %
Platelet Count: 120 10*3/uL — ABNORMAL LOW (ref 150–400)
RBC: 4.49 MIL/uL (ref 3.87–5.11)
RDW: 15.6 % — ABNORMAL HIGH (ref 11.5–15.5)
WBC Count: 5 10*3/uL (ref 4.0–10.5)
nRBC: 0 % (ref 0.0–0.2)

## 2024-01-21 LAB — CMP (CANCER CENTER ONLY)
ALT: 16 U/L (ref 0–44)
AST: 24 U/L (ref 15–41)
Albumin: 3.8 g/dL (ref 3.5–5.0)
Alkaline Phosphatase: 75 U/L (ref 38–126)
Anion gap: 10 (ref 5–15)
BUN: 14 mg/dL (ref 6–20)
CO2: 25 mmol/L (ref 22–32)
Calcium: 9.2 mg/dL (ref 8.9–10.3)
Chloride: 105 mmol/L (ref 98–111)
Creatinine: 0.99 mg/dL (ref 0.44–1.00)
GFR, Estimated: >60 mL/min (ref >60)
Glucose, Bld: 197 mg/dL — ABNORMAL HIGH (ref 70–99)
Potassium: 3.4 mmol/L — ABNORMAL LOW (ref 3.5–5.1)
Sodium: 140 mmol/L (ref 135–145)
Total Bilirubin: 0.4 mg/dL (ref 0.0–1.2)
Total Protein: 6.6 g/dL (ref 6.5–8.1)

## 2024-01-21 LAB — PROTEIN, URINE, RANDOM: Total Protein, Urine: 56 mg/dL

## 2024-01-21 MED ORDER — HEPARIN SOD (PORK) LOCK FLUSH 100 UNIT/ML IV SOLN
500.0000 [IU] | Freq: Once | INTRAVENOUS | Status: AC
  Administered 2024-01-21: 500 [IU] via INTRAVENOUS
  Filled 2024-01-21: qty 5

## 2024-01-21 MED ORDER — PREDNISONE 10 MG (21) PO TBPK: ORAL_TABLET | ORAL | 0 refills | Status: DC

## 2024-01-21 NOTE — Assessment & Plan Note (Signed)
Grade 2  Continue gabapentin 300mg  BID.

## 2024-01-21 NOTE — Assessment & Plan Note (Signed)
Recommend patient to use her inhalers.  Recommend tapering course of prednisone. Rx sent.

## 2024-01-21 NOTE — Assessment & Plan Note (Signed)
Chemotherapy plan as listed above

## 2024-01-21 NOTE — Progress Notes (Signed)
Hematology/Oncology Progress note [phone] [phone]) [phone]      CHIEF COMPLAINTS/REASON FOR VISIT:  Follow-up for Stage IV  rectal cancer treatments.  ASSESSMENT & PLAN:   Cancer Staging  Rectal cancer [facility]) Staging form: Colon and Rectum, AJCC 8th Edition - Pathologic stage from 05/03/2022: Stage IIIB (pT3, pN1b, cM0) - Signed by [name], MD on 05/03/2022 - Pathologic stage from 06/18/2023: Stage IVA (rpTX, pN0, pM1a) - Signed by [name], MD on 06/18/2023   Chemotherapy-induced neuropathy (HCC) Grade 2  Continue gabapentin 300mg  BID.   Encounter for antineoplastic chemotherapy Chemotherapy plan as listed above  Rectal cancer (HCC) History of Stage III. pT3 pN1b cM0, s/p APR. 05/2023 Stage IV current rectal adenocarcinoma Currently on adjuvant chemotherapy. S/p FOLFOX x 4 cycles S/p concurrent chemotherapy [Xeloda 1300 mg BID] with RT, and then another 4 cycles of adjuvant FOLFOX [dose reduced oxaliplatin and omit 5-FU bolus 05/2023 CT imaging indicates disease progression. liver biopsy pathology showed metastatic carcinoma--> FOLFIRI + Bevacizumab   [facility] NGS showed TP53 missense variant, APC frameshift, TCF7L2 Frameshift, FLT3 copy number gain., TMB 4.7, pMMR Wildtype KRAS/NRAS Labs are reviewed and discussed with patient. Hold FOLFIRI + Bevacizumab     COPD exacerbation (HCC) Recommend patient to use her inhalers.  Recommend tapering course of prednisone. Rx sent.    Tobacco use Recommend smoke cessation. .      Orders Placed This Encounter  Procedures   CEA    Standing Status:   Future    Expected Date:   01/28/2024    Expiration Date:   01/27/2025   Protein, urine, random    Standing Status:   Future    Expected Date:   01/28/2024    Expiration Date:   01/27/2025   CBC with Differential (Cancer Center Only)    Standing Status:   Future    Expected Date:   01/28/2024    Expiration Date:   01/27/2025   CMP (Cancer Center only)    Standing Status:    Future    Expected Date:   01/28/2024    Expiration Date:   01/27/2025    Follow-up 1 week(s)  All questions were answered. The patient knows to call the clinic with any problems, questions or concerns.  [name], MD, PhD [facility] Hematology Oncology 01/21/2024      HISTORY OF PRESENTING ILLNESS:   April Luna is a  53 y.o.  female presents for treatment of rectal cancer Oncology History  Rectal cancer (HCC)  02/26/2022 Imaging   MRI PELVIS WITHOUT CONTRAST- By imaging, rectal cancer stage:  T1/T2, N0, Mx    03/02/2022 Imaging   CT CHEST AND ABDOMEN WITH CONTRAST 1. No convincing evidence of metastatic disease within the chest or abdomen. 2. Atrophic left kidney with multifocal renal scarring and cortical calcifications as well as nonobstructive renal stones measuring up to 5 mm. 3. Prominent left-sided predominant retroperitoneal lymph nodes measuring up to 8 mm near the level of the renal hilum, overall decreased in size dating back to CT September 19, 2018 and favored reactive related to left renal inflammation. 4.  Aortic Atherosclerosis (ICD10-I70.0).   03/27/2022 Genetic Testing    [facility] Custom[employer]+RNA cancer panel found no pathogenic mutations.    04/21/2022 Initial Diagnosis   Rectal cancer - baseline CEA 3.6 -02/09/2022, patient had colonoscopy which showed renal mass 10 cm from anal verge.  5 mm polyp in ascending colon.  Removed and retrieved. Pathology showed rectal adenocarcinoma.  The polyp in  the ascending colon is a tubular adenoma.  MRI showed cT1/T2N0 disease  -04/21/2022, patient underwent robotic assisted ultralow anterior resection. Pathology showed moderately differentiated adenocarcinoma, 4.5 cm in maximal extent, with focal extension through muscularis propria into perirectal soft tissue.  3 lymph nodes positive for metastatic carcinoma.  Negative margin.  pT3 pN1b, MSI stable.   05/03/2022 Cancer Staging   Staging form: Colon and Rectum, AJCC 8th  Edition - Pathologic stage from 05/03/2022: Stage IIIB (pT3, pN1b, cM0) - Signed by [name], MD on 05/03/2022 Stage prefix: Initial diagnosis   05/11/2022 Miscellaneous   Medi port placed by Dr.White   05/26/2022 -  Chemotherapy   FOLFOX Q2 weeks x 4   05/26/2022 - 07/09/2022 Chemotherapy   Patient is on Treatment Plan : COLORECTAL FOLFOX q14d x 8 cycles     05/26/2022 - 12/13/2022 Chemotherapy   Patient is on Treatment Plan : COLORECTAL FOLFOX q14d x 4 months     07/27/2022 -  Chemotherapy   Concurrent chemotherapy- Xeloda  1300mg  BID and radiation.    07/27/2022 - 09/12/2022 Radiation Therapy    concurrent chemotherapy [Xeloda 1300 mg BID] with RT    09/20/2022 Imaging   CT Angiogram chest PET protocol There is no evidence of pulmonary artery embolism. There is no evidence of thoracic aortic dissection.   Small linear patchy alveolar infiltrate is seen in medial segment of right middle lobe suggesting atelectasis/pneumonia.     09/28/2022 - 09/30/2022 Hospital Admission   Admission due to acute respiratory failure with hypoxia, COPD exacerbation   09/28/2022 Imaging   CT angio chest PE protocol 1. Negative for acute PE or thoracic aortic dissection. 2. New ground-glass infiltrates anteriorly in bilateral upper lobes,may represent atypical edema, infectious or inflammatory process. 3.  Aortic Atherosclerosis     12/22/2022 Imaging   CT chest abdomen pelvis w contrast  Stable exam. No evidence of recurrent or metastatic carcinoma within the chest, abdomen, or pelvis.   Left nephrolithiasis and renal parenchymal atrophy. No evidence of ureteral calculi or hydronephrosis.   Aortic Atherosclerosis   05/31/2023 Imaging   CT chest abdomen pelvis with contrast showed 1. New hypodense 16 mm lesion in the right lobe of the liver with very subtle adjacent 5 mm lesion, suspicious for metastatic disease. 2. Small bilateral pulmonary nodules, some of which were subtly evident on prior examination  only in retrospect, some of which demonstrate degree of cavitation, suspicious for metastatic disease. 3. Prominent retroperitoneal lymph nodes are similar prior. 4. Prior low anterior resection with Hartmann's pouch formation,similar small amount of perirectal/presacral soft tissue and fluid,favored postsurgical/posttreatment change. Suggest continued attention on follow-up imaging. 5. Large volume of formed stool in the colon. 6. Nonobstructive left renal calculi measure under 1 cm.     06/08/2023 Relapse/Recurrence   Ultrasound-guided liver biopsy showed Pathology showed metastatic carcinoma, morphology consistent with patient's clinical history of rectal adenocarcinoma.  [facility] NGS 648 gene panel showed TP53 missense variant, APC frameshift, TCF7L2 frameshift, FLT3 copy number gain, TMB 4.62m/MB MSI stable.  Wildtype KRAS/NRAS  [facility] RNA Seq - ERBB3 overexpressed, VEGFA overexpressed, NRAS overexpressed.     06/18/2023 Cancer Staging   Staging form: Colon and Rectum, AJCC 8th Edition - Pathologic stage from 06/18/2023: Stage IVA (rpTX, pN0, pM1a) - Signed by [name], MD on 06/18/2023 Stage prefix: Recurrence Total positive nodes: 0   06/25/2023 - 06/25/2023 Chemotherapy   Patient is on Treatment Plan : COLORECTAL FOLFOX + Bevacizumab q14d     07/02/2023 -  Chemotherapy   Patient is on Treatment Plan : COLORECTAL FOLFIRI + Bevacizumab q14d     11/13/2023 Imaging   CT chest abdomen pelvis w contrast showed  1. Increase in cavitation with decreased soft tissue involving the scattered bilateral pulmonary nodules, consistent with treatment response. No new suspicious pulmonary nodules or masses. 2. Decrease in size and conspicuity of the hypodense lesions in the right lobe of the liver, consistent with treatment response. No new suspicious hepatic lesion identified. 3. Stable prominent nonspecific retroperitoneal lymph nodes. Suggest continued attention on follow-up imaging. 4. Similar  surgical changes of prior low anterior resection with left anterior abdominal wall colostomy. Similar mesorectal/presacral soft tissue, no new suspicious enhancing nodularity. 5. Similar soft tissue nodularity in the bilateral posterior gluteal subcutaneous soft tissues. 6. Moderate volume of formed stool in the colon. Correlate for constipation.   Patient has bipolar and schizophrenia..  Patient is married  She has housing issue due to previous history of eviction.  she does not live with her husband currently. She lives with some family members.   + chronic dysuria and urgency she was seen by Urology and was started on [place].  Symptoms are improved.    INTERVAL HISTORY SIMI BRIEL is a 53 y.o. female who has above history reviewed by me today presents for follow up visit for rectal cancer.   Denies any melena or blood in the stool. Stable neuropathy symptoms. She denies any diarrhea. Slightly constipated and she takes stool softer + wheezing and cough recently. She reports complaint with her inhalers.      Review of Systems  Constitutional:  Positive for fatigue. Negative for appetite change, chills and fever.  HENT:   Negative for hearing loss, mouth sores and voice change.   Eyes:  Negative for eye problems.  Respiratory:  Negative for chest tightness, cough and shortness of breath.   Cardiovascular:  Negative for chest pain.  Gastrointestinal:  Negative for abdominal distention, abdominal pain, blood in stool, diarrhea and nausea.       + colostomy  Endocrine: Negative for hot flashes.  Genitourinary:  Positive for dysuria and frequency. Negative for difficulty urinating.   Musculoskeletal:  Negative for arthralgias.  Skin:  Negative for itching and rash.  Neurological:  Positive for numbness. Negative for extremity weakness.  Hematological:  Negative for adenopathy.  Psychiatric/Behavioral:  Negative for confusion.     MEDICAL HISTORY:  Past Medical  History:  Diagnosis Date   Allergy    pollen   Anxiety    Arthritis    right hip   Bipolar 1 disorder (HCC)    Cancer (HCC)    rectal   Chemotherapy-induced neuropathy (HCC) 10/24/2022   Chronic kidney disease    COPD (chronic obstructive pulmonary disease) (HCC)    Depression    Family history of adverse reaction to anesthesia    grand father had a stroke during anesthesia   Family history of breast cancer    Family history of colon cancer    Family history of uterine cancer    GERD (gastroesophageal reflux disease)    History of kidney stones    Hyperlipidemia    Hypertension    Hypothyroidism    Panic attack    Pneumonia    Psoriasis    Sleep apnea 08/11/2021   No CPAP   Type 2 diabetes mellitus with microalbuminuria, without long-term current use of insulin (HCC) 06/24/2019    SURGICAL HISTORY: Past Surgical History:  Procedure Laterality Date  BREAST BIOPSY Left 12/14/2021   [place] bx, venus marker, path pending   CESAREAN SECTION     COLONOSCOPY WITH PROPOFOL N/A 02/09/2022   Procedure: COLONOSCOPY WITH PROPOFOL;  Surgeon: Toney Reil, MD;  Location: [facility];  Service: Endoscopy;  Laterality: N/A;  sleep apnea   CYSTOSCOPY W/ RETROGRADES Left 11/08/2018   Procedure: CYSTOSCOPY WITH RETROGRADE PYELOGRAM;  Surgeon: Sondra Come, MD;  Location: [facility];  Service: Urology;  Laterality: Left;   CYSTOSCOPY/URETEROSCOPY/HOLMIUM LASER/STENT PLACEMENT Left 11/08/2018   Procedure: CYSTOSCOPY/URETEROSCOPY/HOLMIUM LASER/STENT PLACEMENT;  Surgeon: Sondra Come, MD;  Location: [facility];  Service: Urology;  Laterality: Left;   IR CV LINE INJECTION  06/25/2023   IR IMAGING GUIDED PORT INSERTION  05/11/2022   IR REMOVE CV FIBRIN SHEATH  06/27/2023   MOUTH SURGERY     wisdom teeth extraction   MOUTH SURGERY     teeth removal   POLYPECTOMY N/A 02/09/2022   Procedure: POLYPECTOMY;  Surgeon: Toney Reil, MD;  Location: [facility];  Service:  Endoscopy;  Laterality: N/A;   XI ROBOTIC ASSISTED LOWER ANTERIOR RESECTION N/A 04/21/2022   Procedure: XI ROBOTIC ASSISTED LOWER ANTERIOR RESECTION WITH COLOSTOMY, BILATERAL TAP BLOCK, ASSESSMENT OF TISSUE PERFUSSION WITH FIREFLY INJECTION;  Surgeon: Andria Meuse, MD;  Location: [facility];  Service: General;  Laterality: N/A;    SOCIAL HISTORY: Social History   Socioeconomic History   Marital status: Married    Spouse name: Lesle Chris   Number of children: 1   Years of education: 12   Highest education level: High school graduate  Occupational History   Occupation: unemployed    Comment: disabled  Tobacco Use   Smoking status: Some Days    Current packs/day: 0.25    Average packs/day: 0.3 packs/day for 38.7 years (9.7 ttl pk-yrs)    Types: Cigarettes    Start date: 05/13/1985    Passive exposure: Current   Smokeless tobacco: Never   Tobacco comments:    5-6 cigarettes weekly- 11/07/2022  Vaping Use   Vaping status: Former   Start date: 05/25/2018   Quit date: 09/24/2018  Substance and Sexual Activity   Alcohol use: Not Currently    Alcohol/week: 4.0 standard drinks of alcohol    Types: 4 Glasses of wine per week    Comment: quit ETOH in Feb. 2023   Drug use: Yes    Types: Marijuana    Comment: 2 days ago   Sexual activity: Not Currently    Birth control/protection: Post-menopausal  Other Topics Concern   Not on file  Social History Narrative   Lives with husband and son    Social Drivers of Health   Financial Resource Strain: High Risk (08/30/2023)   Overall Financial Resource Strain (CARDIA)    Difficulty of Paying Living Expenses: Hard  Food Insecurity: Food Insecurity Present (08/30/2023)   Hunger Vital Sign    Worried About Running Out of Food in the Last Year: Sometimes true    Ran Out of Food in the Last Year: Sometimes true  Transportation Needs: No Transportation Needs (08/30/2023)   PRAPARE - Administrator, Civil Service (Medical): No     Lack of Transportation (Non-Medical): No  Physical Activity: Inactive (08/30/2023)   Exercise Vital Sign    Days of Exercise per Week: 0 days    Minutes of Exercise per Session: 0 min  Stress: Stress Concern Present (08/30/2023)   [employer] of Occupational Health - Occupational Stress  Questionnaire    Feeling of Stress : Very much  Social Connections: Moderately Integrated (08/30/2023)   Social Connection and Isolation Panel [NHANES]    Frequency of Communication with Friends and Family: Never    Frequency of Social Gatherings with Friends and Family: Once a week    Attends Religious Services: More than 4 times per year    Active Member of [employer] or Organizations: No    Attends Engineer, structural: 1 to 4 times per year    Marital Status: Married  Catering manager Violence: Not At Risk (08/30/2023)   Humiliation, Afraid, Rape, and Kick questionnaire    Fear of Current or Ex-Partner: No    Emotionally Abused: No    Physically Abused: No    Sexually Abused: No    FAMILY HISTORY: Family History  Problem Relation Age of Onset   Depression Mother    Anxiety disorder Mother    Diabetes Mother    Hypertension Mother    Hyperlipidemia Mother    Cancer Mother    Uterine cancer Mother 63   Cervical cancer Mother 16   Colon cancer Father    Depression Brother    Anxiety disorder Brother    Cancer Maternal Aunt        unk types   Diabetes Mellitus II Maternal Grandmother    Hypercholesterolemia Maternal Grandmother    Breast cancer Maternal Grandmother    Cancer Paternal Grandmother    Diabetes Paternal Grandmother    Melanoma Paternal Grandmother    Stomach cancer Paternal Grandmother     ALLERGIES:  is allergic to metformin and related, nsaids, perphenazine, sulfa antibiotics, abilify [aripiprazole], and penicillins.  MEDICATIONS:  Current Outpatient Medications  Medication Sig Dispense Refill   acetaminophen (TYLENOL) 325 MG tablet Take 650 mg by mouth every 6  (six) hours as needed for headache (pain).     albuterol (VENTOLIN HFA) 108 (90 Base) MCG/ACT inhaler Inhale 2 puffs into the lungs every 6 (six) hours as needed for wheezing or shortness of breath. 1 each 5   amantadine (SYMMETREL) 100 MG capsule Take 100 mg by mouth 2 (two) times daily.     blood glucose meter kit and supplies Dispense based on patient and insurance preference. Use up to four times daily as directed. (FOR ICD-10 E10.9, E11.9). 1 each 0   Budeson-Glycopyrrol-Formoterol (BREZTRI AEROSPHERE) 160-9-4.8 MCG/ACT AERO Inhale 2 puffs into the lungs in the morning and at bedtime. 5.9 g 1   buPROPion (WELLBUTRIN XL) 300 MG 24 hr tablet Take 300 mg by mouth daily.     Cholecalciferol (VITAMIN D-3) 125 MCG (5000 UT) TABS Take 5,000 Units by mouth daily. 30 tablet 1   clonazePAM (KLONOPIN) 0.5 MG tablet Take 1 mg by mouth 2 (two) times daily as needed.     dapagliflozin propanediol (FARXIGA) 10 MG TABS tablet TAKE 1 TABLET BY MOUTH DAILY BEFORE BREAKFAST 90 tablet 0   dexamethasone (DECADRON) 4 MG tablet Take 2 tablets (8 mg total) by mouth See admin instructions. Take 8mg  daily for 2 days after each chemotherapy. 30 tablet 1   diltiazem (CARDIZEM CD) 120 MG 24 hr capsule TAKE 1 CAPSULE BY MOUTH DAILY 90 capsule 1   gabapentin (NEURONTIN) 300 MG capsule Take 300 mg by mouth 2 (two) times daily.     icosapent Ethyl (VASCEPA) 1 g capsule TAKE 2 CAPSULES BY MOUTH 2 TIMES DAILY 120 capsule 3   levothyroxine (SYNTHROID) 75 MCG tablet TAKE 1 TABLET BY MOUTH DAILY BEFORE  BREAKFAST 90 tablet 2   lidocaine-prilocaine (EMLA) cream Apply to affected area once 30 g 3   loperamide (IMODIUM) 2 MG capsule Take 1 capsule (2 mg total) by mouth See admin instructions. Initial: 4 mg,the 2 mg every 2 hours (4 mg every 4 hours at night)  maximum: 16 mg/day 60 capsule 2   loratadine (CLARITIN) 10 MG tablet Take 10 mg by mouth every morning.     magic mouthwash w/lidocaine SOLN Take 5 mLs by mouth 4 (four) times  daily as needed for mouth pain. Sig: Swish/Swallow 5-10 ml four times a day as needed. Dispense 480 ml. 1RF 480 mL 1   ondansetron (ZOFRAN) 8 MG tablet Take 1 tablet (8 mg total) by mouth every 8 (eight) hours as needed for nausea, vomiting or refractory nausea / vomiting. Start on the third day after chemotherapy. 30 tablet 1   paliperidone (INVEGA SUSTENNA) 234 MG/1.5ML SUSY injection Inject 234 mg into the muscle every 30 (thirty) days. On or about the 14th of each month     pantoprazole (PROTONIX) 40 MG tablet TAKE 1 TABLET BY MOUTH DAILY 90 tablet 2   pioglitazone (ACTOS) 15 MG tablet Take 1 tablet (15 mg total) by mouth daily. 90 tablet 1   predniSONE (STERAPRED UNI-PAK 21 TAB) 10 MG (21) TBPK tablet Day 1 take 6 tablets, Day 2 take 5 tablets, Day 3 take 4 tablets, Day 4 take 3 tablets, Day 5 take 2 tablets D6 take 1 tablet 1 each 0   promethazine (PHENERGAN) 12.5 MG tablet Take 1 tablet (12.5 mg total) by mouth every 6 (six) hours as needed for nausea or vomiting. 30 tablet 0   rosuvastatin (CRESTOR) 40 MG tablet TAKE 1 TABLET BY MOUTH EVERY MORNING 30 tablet 0   sertraline (ZOLOFT) 100 MG tablet Take 200 mg by mouth every morning.     Spacer/Aero-Holding Chambers (AEROCHAMBER MV) inhaler Use as instructed 1 each 0   Vibegron (GEMTESA) 75 MG TABS Take 1 tablet (75 mg total) by mouth daily.     No current facility-administered medications for this visit.   Facility-Administered Medications Ordered in Other Visits  Medication Dose Route Frequency Provider Last Rate Last Admin   albuterol (PROVENTIL) (2.5 MG/3ML) 0.083% nebulizer solution 2.5 mg  2.5 mg Nebulization Once Raechel Chute, MD       sodium chloride flush (NS) 0.9 % injection 10 mL  10 mL Intracatheter PRN [name], MD   10 mL at 08/15/23 1327     PHYSICAL EXAMINATION: ECOG PERFORMANCE STATUS: 0 - Asymptomatic Vitals:   01/21/24 0918  BP: 124/88  Pulse: (!) 103  Resp: 18  Temp: (!) 96.2 F (35.7 C)  SpO2: 97%    Filed  Weights   01/21/24 0918  Weight: 136 lb 14.4 oz (62.1 kg)     Physical Exam Constitutional:      General: She is not in acute distress. HENT:     Head: Normocephalic and atraumatic.     Mouth/Throat:     Comments: mucositis Eyes:     General: No scleral icterus. Cardiovascular:     Rate and Rhythm: Normal rate and regular rhythm.  Pulmonary:     Effort: Pulmonary effort is normal. No respiratory distress.     Breath sounds: No wheezing.     Comments: Decreased breath sound bilaterally Abdominal:     General: Bowel sounds are normal. There is no distension.     Palpations: Abdomen is soft.     Comments: +  colostomy   Musculoskeletal:        General: No deformity. Normal range of motion.     Cervical back: Normal range of motion and neck supple.  Skin:    General: Skin is warm and dry.     Findings: No erythema or rash.  Neurological:     Mental Status: She is alert and oriented to person, place, and time. Mental status is at baseline.     Cranial Nerves: No cranial nerve deficit.     Coordination: Coordination normal.  Psychiatric:        Mood and Affect: Mood normal.     LABORATORY DATA:  I have reviewed the data as listed    Latest Ref Rng & Units 01/21/2024    8:43 AM 01/07/2024    8:19 AM 12/24/2023   10:32 AM  CBC  WBC 4.0 - 10.5 K/uL 5.0  3.9  6.0   Hemoglobin 12.0 - 15.0 g/dL 16.1  09.6  04.5   Hematocrit 36.0 - 46.0 % 43.3  43.5  43.1   Platelets 150 - 400 K/uL 120  152  139       Latest Ref Rng & Units 01/21/2024    8:43 AM 01/07/2024    8:19 AM 12/24/2023   10:32 AM  CMP  Glucose 70 - 99 mg/dL 409  811  914   BUN 6 - 20 mg/dL 14  16  10    Creatinine 0.44 - 1.00 mg/dL 7.82  9.56  2.13   Sodium 135 - 145 mmol/L 140  138  137   Potassium 3.5 - 5.1 mmol/L 3.4  3.9  3.6   Chloride 98 - 111 mmol/L 105  104  101   CO2 22 - 32 mmol/L 25  24  27    Calcium 8.9 - 10.3 mg/dL 9.2  8.9  8.6   Total Protein 6.5 - 8.1 g/dL 6.6  7.0  6.9   Total Bilirubin 0.0 -  1.2 mg/dL 0.4  0.5  0.4   Alkaline Phos 38 - 126 U/L 75  74  67   AST 15 - 41 U/L 24  19  19    ALT 0 - 44 U/L 16  13  14          RADIOGRAPHIC STUDIES: I have personally reviewed the radiological images as listed and agreed with the findings in the report. No results found.

## 2024-01-21 NOTE — Assessment & Plan Note (Signed)
History of Stage III. pT3 pN1b cM0, s/p APR. 05/2023 Stage IV current rectal adenocarcinoma Currently on adjuvant chemotherapy. S/p FOLFOX x 4 cycles S/p concurrent chemotherapy [Xeloda 1300 mg BID] with RT, and then another 4 cycles of adjuvant FOLFOX [dose reduced oxaliplatin and omit 5-FU bolus 05/2023 CT imaging indicates disease progression. liver biopsy pathology showed metastatic carcinoma--> FOLFIRI + Bevacizumab   [facility] NGS showed TP53 missense variant, APC frameshift, TCF7L2 Frameshift, FLT3 copy number gain., TMB 4.7, pMMR Wildtype KRAS/NRAS Labs are reviewed and discussed with patient. Hold FOLFIRI + Bevacizumab

## 2024-01-21 NOTE — Telephone Encounter (Signed)
Request for testing addon was faxed to [facility] pathology.   HER 2 IHC addon to liver specimen [phone]

## 2024-01-21 NOTE — Assessment & Plan Note (Signed)
Recommend smoke cessation.

## 2024-01-22 ENCOUNTER — Ambulatory Visit: Payer: 59 | Admitting: Physician Assistant

## 2024-01-22 ENCOUNTER — Encounter: Payer: Self-pay | Admitting: Physician Assistant

## 2024-01-22 LAB — CEA: CEA: 5.9 ng/mL — ABNORMAL HIGH (ref 0.0–4.7)

## 2024-01-23 ENCOUNTER — Inpatient Hospital Stay: Payer: 59

## 2024-01-28 ENCOUNTER — Inpatient Hospital Stay: Payer: 59

## 2024-01-28 ENCOUNTER — Encounter: Payer: Self-pay | Admitting: Oncology

## 2024-01-28 ENCOUNTER — Inpatient Hospital Stay (HOSPITAL_BASED_OUTPATIENT_CLINIC_OR_DEPARTMENT_OTHER): Payer: 59 | Admitting: Oncology

## 2024-01-28 ENCOUNTER — Encounter: Payer: Self-pay | Admitting: Nurse Practitioner

## 2024-01-28 ENCOUNTER — Inpatient Hospital Stay: Payer: 59 | Attending: Oncology

## 2024-01-28 VITALS — BP 117/81 | HR 76 | Temp 96.0°F | Resp 18 | Wt 135.0 lb

## 2024-01-28 DIAGNOSIS — I700 Atherosclerosis of aorta: Secondary | ICD-10-CM | POA: Diagnosis not present

## 2024-01-28 DIAGNOSIS — K5900 Constipation, unspecified: Secondary | ICD-10-CM | POA: Insufficient documentation

## 2024-01-28 DIAGNOSIS — Z5111 Encounter for antineoplastic chemotherapy: Secondary | ICD-10-CM

## 2024-01-28 DIAGNOSIS — Z5986 Financial insecurity: Secondary | ICD-10-CM | POA: Insufficient documentation

## 2024-01-28 DIAGNOSIS — F17210 Nicotine dependence, cigarettes, uncomplicated: Secondary | ICD-10-CM | POA: Insufficient documentation

## 2024-01-28 DIAGNOSIS — E785 Hyperlipidemia, unspecified: Secondary | ICD-10-CM | POA: Insufficient documentation

## 2024-01-28 DIAGNOSIS — G620 Drug-induced polyneuropathy: Secondary | ICD-10-CM | POA: Diagnosis not present

## 2024-01-28 DIAGNOSIS — Z79899 Other long term (current) drug therapy: Secondary | ICD-10-CM | POA: Diagnosis not present

## 2024-01-28 DIAGNOSIS — Z79890 Hormone replacement therapy (postmenopausal): Secondary | ICD-10-CM | POA: Insufficient documentation

## 2024-01-28 DIAGNOSIS — J411 Mucopurulent chronic bronchitis: Secondary | ICD-10-CM | POA: Diagnosis not present

## 2024-01-28 DIAGNOSIS — F209 Schizophrenia, unspecified: Secondary | ICD-10-CM | POA: Insufficient documentation

## 2024-01-28 DIAGNOSIS — J9601 Acute respiratory failure with hypoxia: Secondary | ICD-10-CM | POA: Insufficient documentation

## 2024-01-28 DIAGNOSIS — Z803 Family history of malignant neoplasm of breast: Secondary | ICD-10-CM | POA: Insufficient documentation

## 2024-01-28 DIAGNOSIS — J449 Chronic obstructive pulmonary disease, unspecified: Secondary | ICD-10-CM | POA: Insufficient documentation

## 2024-01-28 DIAGNOSIS — E1122 Type 2 diabetes mellitus with diabetic chronic kidney disease: Secondary | ICD-10-CM | POA: Diagnosis not present

## 2024-01-28 DIAGNOSIS — F319 Bipolar disorder, unspecified: Secondary | ICD-10-CM | POA: Diagnosis not present

## 2024-01-28 DIAGNOSIS — G4730 Sleep apnea, unspecified: Secondary | ICD-10-CM | POA: Insufficient documentation

## 2024-01-28 DIAGNOSIS — D122 Benign neoplasm of ascending colon: Secondary | ICD-10-CM | POA: Insufficient documentation

## 2024-01-28 DIAGNOSIS — J441 Chronic obstructive pulmonary disease with (acute) exacerbation: Secondary | ICD-10-CM | POA: Diagnosis not present

## 2024-01-28 DIAGNOSIS — K219 Gastro-esophageal reflux disease without esophagitis: Secondary | ICD-10-CM | POA: Diagnosis not present

## 2024-01-28 DIAGNOSIS — I10 Essential (primary) hypertension: Secondary | ICD-10-CM | POA: Diagnosis not present

## 2024-01-28 DIAGNOSIS — Z800 Family history of malignant neoplasm of digestive organs: Secondary | ICD-10-CM | POA: Insufficient documentation

## 2024-01-28 DIAGNOSIS — E039 Hypothyroidism, unspecified: Secondary | ICD-10-CM | POA: Diagnosis not present

## 2024-01-28 DIAGNOSIS — Z7984 Long term (current) use of oral hypoglycemic drugs: Secondary | ICD-10-CM | POA: Diagnosis not present

## 2024-01-28 DIAGNOSIS — Z7952 Long term (current) use of systemic steroids: Secondary | ICD-10-CM | POA: Insufficient documentation

## 2024-01-28 DIAGNOSIS — C20 Malignant neoplasm of rectum: Secondary | ICD-10-CM | POA: Diagnosis not present

## 2024-01-28 DIAGNOSIS — E1140 Type 2 diabetes mellitus with diabetic neuropathy, unspecified: Secondary | ICD-10-CM | POA: Insufficient documentation

## 2024-01-28 DIAGNOSIS — T451X5A Adverse effect of antineoplastic and immunosuppressive drugs, initial encounter: Secondary | ICD-10-CM | POA: Insufficient documentation

## 2024-01-28 DIAGNOSIS — N200 Calculus of kidney: Secondary | ICD-10-CM | POA: Diagnosis not present

## 2024-01-28 LAB — CMP (CANCER CENTER ONLY)
ALT: 19 U/L (ref 0–44)
AST: 21 U/L (ref 15–41)
Albumin: 4 g/dL (ref 3.5–5.0)
Alkaline Phosphatase: 74 U/L (ref 38–126)
Anion gap: 7 (ref 5–15)
BUN: 17 mg/dL (ref 6–20)
CO2: 25 mmol/L (ref 22–32)
Calcium: 8.9 mg/dL (ref 8.9–10.3)
Chloride: 101 mmol/L (ref 98–111)
Creatinine: 1.19 mg/dL — ABNORMAL HIGH (ref 0.44–1.00)
GFR, Estimated: 55 mL/min — ABNORMAL LOW (ref >60)
Glucose, Bld: 184 mg/dL — ABNORMAL HIGH (ref 70–99)
Potassium: 3.4 mmol/L — ABNORMAL LOW (ref 3.5–5.1)
Sodium: 133 mmol/L — ABNORMAL LOW (ref 135–145)
Total Bilirubin: 0.3 mg/dL (ref 0.0–1.2)
Total Protein: 6.9 g/dL (ref 6.5–8.1)

## 2024-01-28 LAB — CBC WITH DIFFERENTIAL (CANCER CENTER ONLY)
Abs Immature Granulocytes: 0.22 10*3/uL — ABNORMAL HIGH (ref 0.00–0.07)
Basophils Absolute: 0 10*3/uL (ref 0.0–0.1)
Basophils Relative: 0 %
Eosinophils Absolute: 0.1 10*3/uL (ref 0.0–0.5)
Eosinophils Relative: 1 %
HCT: 41.1 % (ref 36.0–46.0)
Hemoglobin: 13.8 g/dL (ref 12.0–15.0)
Immature Granulocytes: 3 %
Lymphocytes Relative: 19 %
Lymphs Abs: 1.5 10*3/uL (ref 0.7–4.0)
MCH: 32.3 pg (ref 26.0–34.0)
MCHC: 33.6 g/dL (ref 30.0–36.0)
MCV: 96.3 fL (ref 80.0–100.0)
Monocytes Absolute: 0.9 10*3/uL (ref 0.1–1.0)
Monocytes Relative: 12 %
Neutro Abs: 5.1 10*3/uL (ref 1.7–7.7)
Neutrophils Relative %: 65 %
Platelet Count: 139 10*3/uL — ABNORMAL LOW (ref 150–400)
RBC: 4.27 MIL/uL (ref 3.87–5.11)
RDW: 16.3 % — ABNORMAL HIGH (ref 11.5–15.5)
WBC Count: 7.8 10*3/uL (ref 4.0–10.5)
nRBC: 0 % (ref 0.0–0.2)

## 2024-01-28 LAB — PROTEIN, URINE, RANDOM: Total Protein, Urine: 10 mg/dL

## 2024-01-28 MED ORDER — SODIUM CHLORIDE 0.9 % IV SOLN
5.0000 mg/kg | Freq: Once | INTRAVENOUS | Status: AC
  Administered 2024-01-28: 300 mg via INTRAVENOUS
  Filled 2024-01-28: qty 12

## 2024-01-28 MED ORDER — SODIUM CHLORIDE 0.9 % IV SOLN
400.0000 mg/m2 | Freq: Once | INTRAVENOUS | Status: AC
  Administered 2024-01-28: 644 mg via INTRAVENOUS
  Filled 2024-01-28: qty 32.2

## 2024-01-28 MED ORDER — SODIUM CHLORIDE 0.9 % IV SOLN
Freq: Once | INTRAVENOUS | Status: AC
  Filled 2024-01-28: qty 250

## 2024-01-28 MED ORDER — PALONOSETRON HCL INJECTION 0.25 MG/5ML
0.2500 mg | Freq: Once | INTRAVENOUS | Status: AC
  Administered 2024-01-28: 0.25 mg via INTRAVENOUS
  Filled 2024-01-28: qty 5

## 2024-01-28 MED ORDER — DEXAMETHASONE SODIUM PHOSPHATE 10 MG/ML IJ SOLN
10.0000 mg | Freq: Once | INTRAMUSCULAR | Status: AC
  Administered 2024-01-28: 10 mg via INTRAVENOUS
  Filled 2024-01-28: qty 1

## 2024-01-28 MED ORDER — SODIUM CHLORIDE 0.9 % IV SOLN
2400.0000 mg/m2 | INTRAVENOUS | Status: DC
  Administered 2024-01-28: 3850 mg via INTRAVENOUS
  Filled 2024-01-28: qty 77

## 2024-01-28 MED ORDER — SODIUM CHLORIDE 0.9 % IV SOLN
180.0000 mg/m2 | Freq: Once | INTRAVENOUS | Status: AC
  Administered 2024-01-28: 300 mg via INTRAVENOUS
  Filled 2024-01-28: qty 15

## 2024-01-28 MED ORDER — FLUOROURACIL CHEMO INJECTION 2.5 GM/50ML
400.0000 mg/m2 | Freq: Once | INTRAVENOUS | Status: AC
  Administered 2024-01-28: 650 mg via INTRAVENOUS
  Filled 2024-01-28: qty 13

## 2024-01-28 MED ORDER — ATROPINE SULFATE 1 MG/ML IV SOLN
0.5000 mg | Freq: Once | INTRAVENOUS | Status: AC | PRN
  Administered 2024-01-28: 0.5 mg via INTRAVENOUS
  Filled 2024-01-28: qty 1

## 2024-01-28 NOTE — Assessment & Plan Note (Signed)
 Chemotherapy plan as listed above

## 2024-01-28 NOTE — Assessment & Plan Note (Signed)
 Grade 2  Continue gabapentin 300mg  BID.

## 2024-01-28 NOTE — Progress Notes (Signed)
Hematology/Oncology Progress note [phone] [phone]      CHIEF COMPLAINTS/REASON FOR VISIT:  Follow-up for Stage IV  rectal cancer treatments.  ASSESSMENT & PLAN:   Cancer Staging  Rectal cancer [facility]) Staging form: Colon and Rectum, AJCC 8th Edition - Pathologic stage from 05/03/2022: Stage IIIB (pT3, pN1b, cM0) - Signed by [name], MD on 05/03/2022 - Pathologic stage from 06/18/2023: Stage IVA (rpTX, pN0, pM1a) - Signed by [name], MD on 06/18/2023   Rectal cancer [facility]) History of Stage III. pT3 pN1b cM0, s/p APR. 05/2023 Stage IV current rectal adenocarcinoma Currently on adjuvant chemotherapy. S/p FOLFOX x 4 cycles S/p concurrent chemotherapy [Xeloda 1300 mg BID] with RT, and then another 4 cycles of adjuvant FOLFOX [dose reduced oxaliplatin and omit 5-FU bolus 05/2023 CT imaging indicates disease progression. liver biopsy pathology showed metastatic carcinoma--> FOLFIRI + Bevacizumab   [employer] NGS showed TP53 missense variant, APC frameshift, TCF7L2 Frameshift, FLT3 copy number gain., TMB 4.7, pMMR Wildtype KRAS/NRAS Labs are reviewed and discussed with patient. Proceed with FOLFIRI + Bevacizumab     COPD (chronic obstructive pulmonary disease) (HCC) Recommend patient to use her inhalers.  Improved after tapering course of prednisone.    Chemotherapy-induced neuropathy (HCC) Grade 2  Continue gabapentin 300mg  BID.   Encounter for antineoplastic chemotherapy Chemotherapy plan as listed above    Orders Placed This Encounter  Procedures   CT CHEST ABDOMEN PELVIS W CONTRAST    Standing Status:   Future    Expected Date:   02/18/2024    Expiration Date:   01/27/2025    If indicated for the ordered procedure, I authorize the administration of contrast media per Radiology protocol:   Yes    Does the patient have a contrast media/X-ray dye allergy?:   No    Is patient pregnant?:   No    Preferred imaging location?:   [facility]    If  indicated for the ordered procedure, I authorize the administration of oral contrast media per Radiology protocol:   Yes   CEA    Standing Status:   Future    Expected Date:   02/25/2024    Expiration Date:   02/24/2025   Protein, urine, random    Standing Status:   Future    Expected Date:   02/25/2024    Expiration Date:   02/24/2025   CBC with Differential (Cancer Center Only)    Standing Status:   Future    Expected Date:   02/25/2024    Expiration Date:   02/24/2025   CMP (Cancer Center only)    Standing Status:   Future    Expected Date:   02/25/2024    Expiration Date:   02/24/2025    Follow-up 2 week(s)  All questions were answered. The patient knows to call the clinic with any problems, questions or concerns.  [name], MD, PhD [facility] Hematology Oncology 01/28/2024      HISTORY OF PRESENTING ILLNESS:   April Luna is a  53 y.o.  female presents for treatment of rectal cancer Oncology History  Rectal cancer (HCC)  02/26/2022 Imaging   MRI PELVIS WITHOUT CONTRAST- By imaging, rectal cancer stage:  T1/T2, N0, Mx    03/02/2022 Imaging   CT CHEST AND ABDOMEN WITH CONTRAST 1. No convincing evidence of metastatic disease within the chest or abdomen. 2. Atrophic left kidney with multifocal renal scarring and cortical calcifications as well as nonobstructive renal stones measuring up to 5 mm.  3. Prominent left-sided predominant retroperitoneal lymph nodes measuring up to 8 mm near the level of the renal hilum, overall decreased in size dating back to CT September 19, 2018 and favored reactive related to left renal inflammation. 4.  Aortic Atherosclerosis (ICD10-I70.0).   03/27/2022 Genetic Testing    [facility] CustomNext+RNA cancer panel found no pathogenic mutations.    04/21/2022 Initial Diagnosis   Rectal cancer - baseline CEA 3.6 -02/09/2022, patient had colonoscopy which showed renal mass 10 cm from anal verge.  5 mm polyp in ascending colon.  Removed and retrieved. Pathology  showed rectal adenocarcinoma.  The polyp in the ascending colon is a tubular adenoma.  MRI showed cT1/T2N0 disease  -04/21/2022, patient underwent robotic assisted ultralow anterior resection. Pathology showed moderately differentiated adenocarcinoma, 4.5 cm in maximal extent, with focal extension through muscularis propria into perirectal soft tissue.  3 lymph nodes positive for metastatic carcinoma.  Negative margin.  pT3 pN1b, MSI stable.   05/03/2022 Cancer Staging   Staging form: Colon and Rectum, AJCC 8th Edition - Pathologic stage from 05/03/2022: Stage IIIB (pT3, pN1b, cM0) - Signed by [name], MD on 05/03/2022 Stage prefix: Initial diagnosis   05/11/2022 Miscellaneous   Medi port placed by Dr.White   05/26/2022 -  Chemotherapy   FOLFOX Q2 weeks x 4   05/26/2022 - 07/09/2022 Chemotherapy   Patient is on Treatment Plan : COLORECTAL FOLFOX q14d x 8 cycles     05/26/2022 - 12/13/2022 Chemotherapy   Patient is on Treatment Plan : COLORECTAL FOLFOX q14d x 4 months     07/27/2022 -  Chemotherapy   Concurrent chemotherapy- Xeloda  1300mg  BID and radiation.    07/27/2022 - 09/12/2022 Radiation Therapy    concurrent chemotherapy [Xeloda 1300 mg BID] with RT    09/20/2022 Imaging   CT Angiogram chest PET protocol There is no evidence of pulmonary artery embolism. There is no evidence of thoracic aortic dissection.   Small linear patchy alveolar infiltrate is seen in medial segment of right middle lobe suggesting atelectasis/pneumonia.     09/28/2022 - 09/30/2022 Hospital Admission   Admission due to acute respiratory failure with hypoxia, COPD exacerbation   09/28/2022 Imaging   CT angio chest PE protocol 1. Negative for acute PE or thoracic aortic dissection. 2. New ground-glass infiltrates anteriorly in bilateral upper lobes,may represent atypical edema, infectious or inflammatory process. 3.  Aortic Atherosclerosis     12/22/2022 Imaging   CT chest abdomen pelvis w contrast  Stable  exam. No evidence of recurrent or metastatic carcinoma within the chest, abdomen, or pelvis.   Left nephrolithiasis and renal parenchymal atrophy. No evidence of ureteral calculi or hydronephrosis.   Aortic Atherosclerosis   05/31/2023 Imaging   CT chest abdomen pelvis with contrast showed 1. New hypodense 16 mm lesion in the right lobe of the liver with very subtle adjacent 5 mm lesion, suspicious for metastatic disease. 2. Small bilateral pulmonary nodules, some of which were subtly evident on prior examination only in retrospect, some of which demonstrate degree of cavitation, suspicious for metastatic disease. 3. Prominent retroperitoneal lymph nodes are similar prior. 4. Prior low anterior resection with Hartmann's pouch formation,similar small amount of perirectal/presacral soft tissue and fluid,favored postsurgical/posttreatment change. Suggest continued attention on follow-up imaging. 5. Large volume of formed stool in the colon. 6. Nonobstructive left renal calculi measure under 1 cm.     06/08/2023 Relapse/Recurrence   Ultrasound-guided liver biopsy showed Pathology showed metastatic carcinoma, morphology consistent with patient's clinical history of rectal  adenocarcinoma.  [employer] NGS 648 gene panel showed TP53 missense variant, APC frameshift, TCF7L2 frameshift, FLT3 copy number gain, TMB 4.7m/MB MSI stable.  Wildtype KRAS/NRAS  [employer] RNA Seq - ERBB3 overexpressed, VEGFA overexpressed, NRAS overexpressed.     06/18/2023 Cancer Staging   Staging form: Colon and Rectum, AJCC 8th Edition - Pathologic stage from 06/18/2023: Stage IVA (rpTX, pN0, pM1a) - Signed by [name], MD on 06/18/2023 Stage prefix: Recurrence Total positive nodes: 0   06/25/2023 - 06/25/2023 Chemotherapy   Patient is on Treatment Plan : COLORECTAL FOLFOX + Bevacizumab q14d     07/02/2023 -  Chemotherapy   Patient is on Treatment Plan : COLORECTAL FOLFIRI + Bevacizumab q14d     11/13/2023 Imaging   CT chest  abdomen pelvis w contrast showed  1. Increase in cavitation with decreased soft tissue involving the scattered bilateral pulmonary nodules, consistent with treatment response. No new suspicious pulmonary nodules or masses. 2. Decrease in size and conspicuity of the hypodense lesions in the right lobe of the liver, consistent with treatment response. No new suspicious hepatic lesion identified. 3. Stable prominent nonspecific retroperitoneal lymph nodes. Suggest continued attention on follow-up imaging. 4. Similar surgical changes of prior low anterior resection with left anterior abdominal wall colostomy. Similar mesorectal/presacral soft tissue, no new suspicious enhancing nodularity. 5. Similar soft tissue nodularity in the bilateral posterior gluteal subcutaneous soft tissues. 6. Moderate volume of formed stool in the colon. Correlate for constipation.   Patient has bipolar and schizophrenia..  Patient is married  She has housing issue due to previous history of eviction.  she does not live with her husband currently. She lives with some family members.   + chronic dysuria and urgency she was seen by Urology and was started on [place].  Symptoms are improved.    INTERVAL HISTORY April Luna is a 53 y.o. female who has above history reviewed by me today presents for follow up visit for rectal cancer.   Denies any melena or blood in the stool. Stable neuropathy symptoms. She denies any diarrhea. Slightly constipated and she takes stool softer + wheezing and cough have significantly improved.      Review of Systems  Constitutional:  Positive for fatigue. Negative for appetite change, chills and fever.  HENT:   Negative for hearing loss, mouth sores and voice change.   Eyes:  Negative for eye problems.  Respiratory:  Negative for chest tightness, cough and shortness of breath.   Cardiovascular:  Negative for chest pain.  Gastrointestinal:  Negative for abdominal  distention, abdominal pain, blood in stool, diarrhea and nausea.       + colostomy  Endocrine: Negative for hot flashes.  Genitourinary:  Positive for dysuria and frequency. Negative for difficulty urinating.   Musculoskeletal:  Negative for arthralgias.  Skin:  Negative for itching and rash.  Neurological:  Positive for numbness. Negative for extremity weakness.  Hematological:  Negative for adenopathy.  Psychiatric/Behavioral:  Negative for confusion.     MEDICAL HISTORY:  Past Medical History:  Diagnosis Date   Allergy    pollen   Anxiety    Arthritis    right hip   Bipolar 1 disorder (HCC)    Cancer (HCC)    rectal   Chemotherapy-induced neuropathy (HCC) 10/24/2022   Chronic kidney disease    COPD (chronic obstructive pulmonary disease) (HCC)    Depression    Family history of adverse reaction to anesthesia    grand father had a stroke during anesthesia  Family history of breast cancer    Family history of colon cancer    Family history of uterine cancer    GERD (gastroesophageal reflux disease)    History of kidney stones    Hyperlipidemia    Hypertension    Hypothyroidism    Panic attack    Pneumonia    Psoriasis    Sleep apnea 08/11/2021   No CPAP   Type 2 diabetes mellitus with microalbuminuria, without long-term current use of insulin (HCC) 06/24/2019    SURGICAL HISTORY: Past Surgical History:  Procedure Laterality Date   BREAST BIOPSY Left 12/14/2021   [place] bx, venus marker, path pending   CESAREAN SECTION     COLONOSCOPY WITH PROPOFOL N/A 02/09/2022   Procedure: COLONOSCOPY WITH PROPOFOL;  Surgeon: Toney Reil, MD;  Location: [facility];  Service: Endoscopy;  Laterality: N/A;  sleep apnea   CYSTOSCOPY W/ RETROGRADES Left 11/08/2018   Procedure: CYSTOSCOPY WITH RETROGRADE PYELOGRAM;  Surgeon: Sondra Come, MD;  Location: [facility];  Service: Urology;  Laterality: Left;   CYSTOSCOPY/URETEROSCOPY/HOLMIUM LASER/STENT PLACEMENT Left  11/08/2018   Procedure: CYSTOSCOPY/URETEROSCOPY/HOLMIUM LASER/STENT PLACEMENT;  Surgeon: Sondra Come, MD;  Location: [facility];  Service: Urology;  Laterality: Left;   IR CV LINE INJECTION  06/25/2023   IR IMAGING GUIDED PORT INSERTION  05/11/2022   IR REMOVE CV FIBRIN SHEATH  06/27/2023   MOUTH SURGERY     wisdom teeth extraction   MOUTH SURGERY     teeth removal   POLYPECTOMY N/A 02/09/2022   Procedure: POLYPECTOMY;  Surgeon: Toney Reil, MD;  Location: [facility];  Service: Endoscopy;  Laterality: N/A;   XI ROBOTIC ASSISTED LOWER ANTERIOR RESECTION N/A 04/21/2022   Procedure: XI ROBOTIC ASSISTED LOWER ANTERIOR RESECTION WITH COLOSTOMY, BILATERAL TAP BLOCK, ASSESSMENT OF TISSUE PERFUSSION WITH FIREFLY INJECTION;  Surgeon: Andria Meuse, MD;  Location: [facility];  Service: General;  Laterality: N/A;    SOCIAL HISTORY: Social History   Socioeconomic History   Marital status: Married    Spouse name: Lesle Chris   Number of children: 1   Years of education: 12   Highest education level: High school graduate  Occupational History   Occupation: unemployed    Comment: disabled  Tobacco Use   Smoking status: Some Days    Current packs/day: 0.25    Average packs/day: 0.2 packs/day for 38.7 years (9.7 ttl pk-yrs)    Types: Cigarettes    Start date: 05/13/1985    Passive exposure: Current   Smokeless tobacco: Never   Tobacco comments:    5-6 cigarettes weekly- 11/07/2022  Vaping Use   Vaping status: Former   Start date: 05/25/2018   Quit date: 09/24/2018  Substance and Sexual Activity   Alcohol use: Not Currently    Alcohol/week: 4.0 standard drinks of alcohol    Types: 4 Glasses of wine per week    Comment: quit ETOH in Feb. 2023   Drug use: Yes    Types: Marijuana    Comment: 2 days ago   Sexual activity: Not Currently    Birth control/protection: Post-menopausal  Other Topics Concern   Not on file  Social History Narrative   Lives with husband and  son    Social Drivers of Health   Financial Resource Strain: High Risk (08/30/2023)   Overall Financial Resource Strain (CARDIA)    Difficulty of Paying Living Expenses: Hard  Food Insecurity: Food Insecurity Present (08/30/2023)   Hunger Vital Sign  Worried About Programme researcher, broadcasting/film/video in the Last Year: Sometimes true    [employer] of Food in the Last Year: Sometimes true  Transportation Needs: No Transportation Needs (08/30/2023)   PRAPARE - Administrator, Civil Service (Medical): No    Lack of Transportation (Non-Medical): No  Physical Activity: Inactive (08/30/2023)   Exercise Vital Sign    Days of Exercise per Week: 0 days    Minutes of Exercise per Session: 0 min  Stress: Stress Concern Present (08/30/2023)   [employer] of Occupational Health - Occupational Stress Questionnaire    Feeling of Stress : Very much  Social Connections: Moderately Integrated (08/30/2023)   Social Connection and Isolation Panel [NHANES]    Frequency of Communication with Friends and Family: Never    Frequency of Social Gatherings with Friends and Family: Once a week    Attends Religious Services: More than 4 times per year    Active Member of [employer] or Organizations: No    Attends Engineer, structural: 1 to 4 times per year    Marital Status: Married  Catering manager Violence: Not At Risk (08/30/2023)   Humiliation, Afraid, Rape, and Kick questionnaire    Fear of Current or Ex-Partner: No    Emotionally Abused: No    Physically Abused: No    Sexually Abused: No    FAMILY HISTORY: Family History  Problem Relation Age of Onset   Depression Mother    Anxiety disorder Mother    Diabetes Mother    Hypertension Mother    Hyperlipidemia Mother    Cancer Mother    Uterine cancer Mother 47   Cervical cancer Mother 4   Colon cancer Father    Depression Brother    Anxiety disorder Brother    Cancer Maternal Aunt        unk types   Diabetes Mellitus II Maternal Grandmother     Hypercholesterolemia Maternal Grandmother    Breast cancer Maternal Grandmother    Cancer Paternal Grandmother    Diabetes Paternal Grandmother    Melanoma Paternal Grandmother    Stomach cancer Paternal Grandmother     ALLERGIES:  is allergic to metformin and related, nsaids, perphenazine, sulfa antibiotics, abilify [aripiprazole], and penicillins.  MEDICATIONS:  Current Outpatient Medications  Medication Sig Dispense Refill   acetaminophen (TYLENOL) 325 MG tablet Take 650 mg by mouth every 6 (six) hours as needed for headache (pain).     albuterol (VENTOLIN HFA) 108 (90 Base) MCG/ACT inhaler Inhale 2 puffs into the lungs every 6 (six) hours as needed for wheezing or shortness of breath. 1 each 5   amantadine (SYMMETREL) 100 MG capsule Take 100 mg by mouth 2 (two) times daily.     blood glucose meter kit and supplies Dispense based on patient and insurance preference. Use up to four times daily as directed. (FOR ICD-10 E10.9, E11.9). 1 each 0   Budeson-Glycopyrrol-Formoterol (BREZTRI AEROSPHERE) 160-9-4.8 MCG/ACT AERO Inhale 2 puffs into the lungs in the morning and at bedtime. 5.9 g 1   buPROPion (WELLBUTRIN XL) 300 MG 24 hr tablet Take 300 mg by mouth daily.     Cholecalciferol (VITAMIN D-3) 125 MCG (5000 UT) TABS Take 5,000 Units by mouth daily. 30 tablet 1   clonazePAM (KLONOPIN) 0.5 MG tablet Take 1 mg by mouth 2 (two) times daily as needed.     dapagliflozin propanediol (FARXIGA) 10 MG TABS tablet TAKE 1 TABLET BY MOUTH DAILY BEFORE BREAKFAST 90 tablet 0  dexamethasone (DECADRON) 4 MG tablet Take 2 tablets (8 mg total) by mouth See admin instructions. Take 8mg  daily for 2 days after each chemotherapy. 30 tablet 1   diltiazem (CARDIZEM CD) 120 MG 24 hr capsule TAKE 1 CAPSULE BY MOUTH DAILY 90 capsule 1   gabapentin (NEURONTIN) 300 MG capsule Take 300 mg by mouth 2 (two) times daily.     icosapent Ethyl (VASCEPA) 1 g capsule TAKE 2 CAPSULES BY MOUTH 2 TIMES DAILY 120 capsule 3    levothyroxine (SYNTHROID) 75 MCG tablet TAKE 1 TABLET BY MOUTH DAILY BEFORE BREAKFAST 90 tablet 2   lidocaine-prilocaine (EMLA) cream Apply to affected area once 30 g 3   loperamide (IMODIUM) 2 MG capsule Take 1 capsule (2 mg total) by mouth See admin instructions. Initial: 4 mg,the 2 mg every 2 hours (4 mg every 4 hours at night)  maximum: 16 mg/day 60 capsule 2   loratadine (CLARITIN) 10 MG tablet Take 10 mg by mouth every morning.     magic mouthwash w/lidocaine SOLN Take 5 mLs by mouth 4 (four) times daily as needed for mouth pain. Sig: Swish/Swallow 5-10 ml four times a day as needed. Dispense 480 ml. 1RF 480 mL 1   ondansetron (ZOFRAN) 8 MG tablet Take 1 tablet (8 mg total) by mouth every 8 (eight) hours as needed for nausea, vomiting or refractory nausea / vomiting. Start on the third day after chemotherapy. 30 tablet 1   paliperidone (INVEGA SUSTENNA) 234 MG/1.5ML SUSY injection Inject 234 mg into the muscle every 30 (thirty) days. On or about the 14th of each month     pantoprazole (PROTONIX) 40 MG tablet TAKE 1 TABLET BY MOUTH DAILY 90 tablet 2   pioglitazone (ACTOS) 15 MG tablet Take 1 tablet (15 mg total) by mouth daily. 90 tablet 1   predniSONE (STERAPRED UNI-PAK 21 TAB) 10 MG (21) TBPK tablet Day 1 take 6 tablets, Day 2 take 5 tablets, Day 3 take 4 tablets, Day 4 take 3 tablets, Day 5 take 2 tablets D6 take 1 tablet 1 each 0   promethazine (PHENERGAN) 12.5 MG tablet Take 1 tablet (12.5 mg total) by mouth every 6 (six) hours as needed for nausea or vomiting. 30 tablet 0   rosuvastatin (CRESTOR) 40 MG tablet TAKE 1 TABLET BY MOUTH EVERY MORNING 30 tablet 0   sertraline (ZOLOFT) 100 MG tablet Take 200 mg by mouth every morning.     Spacer/Aero-Holding Chambers (AEROCHAMBER MV) inhaler Use as instructed 1 each 0   Vibegron (GEMTESA) 75 MG TABS Take 1 tablet (75 mg total) by mouth daily.     No current facility-administered medications for this visit.   Facility-Administered Medications  Ordered in Other Visits  Medication Dose Route Frequency Provider Last Rate Last Admin   albuterol (PROVENTIL) (2.5 MG/3ML) 0.083% nebulizer solution 2.5 mg  2.5 mg Nebulization Once Raechel Chute, MD       atropine injection 0.5 mg  0.5 mg Intravenous Once PRN [name], MD       dexamethasone (DECADRON) injection 10 mg  10 mg Intravenous Once [name], MD       fluorouracil (ADRUCIL) 3,850 mg in sodium chloride 0.9 % 73 mL chemo infusion  2,400 mg/m2 (Treatment Plan Recorded) Intravenous 1 day or 1 dose [name], MD       fluorouracil (ADRUCIL) chemo injection 650 mg  400 mg/m2 (Treatment Plan Recorded) Intravenous Once [name], MD       irinotecan (CAMPTOSAR) 300 mg  in sodium chloride 0.9 % 500 mL chemo infusion  180 mg/m2 (Treatment Plan Recorded) Intravenous Once [name], MD       leucovorin 644 mg in sodium chloride 0.9 % 250 mL infusion  400 mg/m2 (Treatment Plan Recorded) Intravenous Once [name], MD       palonosetron (ALOXI) injection 0.25 mg  0.25 mg Intravenous Once [name], MD       sodium chloride flush (NS) 0.9 % injection 10 mL  10 mL Intracatheter PRN [name], MD   10 mL at 08/15/23 1327     PHYSICAL EXAMINATION: ECOG PERFORMANCE STATUS: 0 - Asymptomatic Vitals:   01/28/24 0908 01/28/24 0917  BP: (!) 128/92 117/81  Pulse: 76   Resp: 18   Temp: (!) 96 F (35.6 C)   SpO2: 97%     Filed Weights   01/28/24 0908  Weight: 135 lb (61.2 kg)     Physical Exam Constitutional:      General: She is not in acute distress. HENT:     Head: Normocephalic and atraumatic.     Mouth/Throat:     Comments: mucositis Eyes:     General: No scleral icterus. Cardiovascular:     Rate and Rhythm: Normal rate and regular rhythm.  Pulmonary:     Effort: Pulmonary effort is normal. No respiratory distress.     Breath sounds: No wheezing.     Comments: Decreased breath sound bilaterally Abdominal:     General: Bowel sounds are normal. There is no distension.     Palpations:  Abdomen is soft.     Comments: +colostomy   Musculoskeletal:        General: No deformity. Normal range of motion.     Cervical back: Normal range of motion and neck supple.  Skin:    General: Skin is warm and dry.     Findings: No erythema or rash.  Neurological:     Mental Status: She is alert and oriented to person, place, and time. Mental status is at baseline.     Cranial Nerves: No cranial nerve deficit.     Coordination: Coordination normal.  Psychiatric:        Mood and Affect: Mood normal.     LABORATORY DATA:  I have reviewed the data as listed    Latest Ref Rng & Units 01/28/2024    9:01 AM 01/21/2024    8:43 AM 01/07/2024    8:19 AM  CBC  WBC 4.0 - 10.5 K/uL 7.8  5.0  3.9   Hemoglobin 12.0 - 15.0 g/dL 16.1  09.6  04.5   Hematocrit 36.0 - 46.0 % 41.1  43.3  43.5   Platelets 150 - 400 K/uL 139  120  152       Latest Ref Rng & Units 01/28/2024    9:01 AM 01/21/2024    8:43 AM 01/07/2024    8:19 AM  CMP  Glucose 70 - 99 mg/dL 409  811  914   BUN 6 - 20 mg/dL 17  14  16    Creatinine 0.44 - 1.00 mg/dL 7.82  9.56  2.13   Sodium 135 - 145 mmol/L 133  140  138   Potassium 3.5 - 5.1 mmol/L 3.4  3.4  3.9   Chloride 98 - 111 mmol/L 101  105  104   CO2 22 - 32 mmol/L 25  25  24    Calcium 8.9 - 10.3 mg/dL 8.9  9.2  8.9   Total Protein  6.5 - 8.1 g/dL 6.9  6.6  7.0   Total Bilirubin 0.0 - 1.2 mg/dL 0.3  0.4  0.5   Alkaline Phos 38 - 126 U/L 74  75  74   AST 15 - 41 U/L 21  24  19    ALT 0 - 44 U/L 19  16  13          RADIOGRAPHIC STUDIES: I have personally reviewed the radiological images as listed and agreed with the findings in the report. No results found.

## 2024-01-28 NOTE — Patient Instructions (Signed)
[facility] - A DEPT OF [facility]  Discharge Instructions: Thank you for choosing [facility] to provide your oncology and hematology care.  If you have a lab appointment with the Cancer Center, please go directly to the Cancer Center and check in at the registration area.  Wear comfortable clothing and clothing appropriate for easy access to any Portacath or PICC line.   We strive to give you quality time with your provider. You may need to reschedule your appointment if you arrive late (15 or more minutes).  Arriving late affects you and other patients whose appointments are after yours.  Also, if you miss three or more appointments without notifying the office, you may be dismissed from the clinic at the provider's discretion.      For prescription refill requests, have your pharmacy contact our office and allow 72 hours for refills to be completed.    Today you received the following chemotherapy and/or immunotherapy agents- avastin, irinotecan, leucovorin, 5FU      To help prevent nausea and vomiting after your treatment, we encourage you to take your nausea medication as directed.  BELOW ARE SYMPTOMS THAT SHOULD BE REPORTED IMMEDIATELY: *FEVER GREATER THAN 100.4 F (38 C) OR HIGHER *CHILLS OR SWEATING *NAUSEA AND VOMITING THAT IS NOT CONTROLLED WITH YOUR NAUSEA MEDICATION *UNUSUAL SHORTNESS OF BREATH *UNUSUAL BRUISING OR BLEEDING *URINARY PROBLEMS (pain or burning when urinating, or frequent urination) *BOWEL PROBLEMS (unusual diarrhea, constipation, pain near the anus) TENDERNESS IN MOUTH AND THROAT WITH OR WITHOUT PRESENCE OF ULCERS (sore throat, sores in mouth, or a toothache) UNUSUAL RASH, SWELLING OR PAIN  UNUSUAL VAGINAL DISCHARGE OR ITCHING   Items with * indicate a potential emergency and should be followed up as soon as possible or go to the Emergency Department if any problems should occur.  Please show the CHEMOTHERAPY  ALERT CARD or IMMUNOTHERAPY ALERT CARD at check-in to the Emergency Department and triage nurse.  Should you have questions after your visit or need to cancel or reschedule your appointment, please contact [facility] - A DEPT OF [facility]  [phone] and follow the prompts.  Office hours are 8:00 a.m. to 4:30 p.m. Monday - Friday. Please note that voicemails left after 4:00 p.m. may not be returned until the following business day.  We are closed weekends and major holidays. You have access to a nurse at all times for urgent questions. Please call the main number to the clinic [phone] and follow the prompts.  For any non-urgent questions, you may also contact your provider using MyChart. We now offer e-Visits for anyone 62 and older to request care online for non-urgent symptoms. For details visit [email or web address].   Also download the MyChart app! Go to the app store, search "MyChart", open the app, select [place], and log in with your MyChart username and password.

## 2024-01-28 NOTE — Assessment & Plan Note (Addendum)
History of Stage III. pT3 pN1b cM0, s/p APR. 05/2023 Stage IV current rectal adenocarcinoma Currently on adjuvant chemotherapy. S/p FOLFOX x 4 cycles S/p concurrent chemotherapy [Xeloda 1300 mg BID] with RT, and then another 4 cycles of adjuvant FOLFOX [dose reduced oxaliplatin and omit 5-FU bolus 05/2023 CT imaging indicates disease progression. liver biopsy pathology showed metastatic carcinoma--> FOLFIRI + Bevacizumab   [facility] NGS showed TP53 missense variant, APC frameshift, TCF7L2 Frameshift, FLT3 copy number gain., TMB 4.7, pMMR Wildtype KRAS/NRAS Labs are reviewed and discussed with patient. Proceed with FOLFIRI + Bevacizumab

## 2024-01-28 NOTE — Assessment & Plan Note (Signed)
Recommend patient to use her inhalers.  Improved after tapering course of prednisone.

## 2024-01-29 LAB — CEA: CEA: 5.6 ng/mL — ABNORMAL HIGH (ref 0.0–4.7)

## 2024-01-30 ENCOUNTER — Inpatient Hospital Stay: Payer: 59

## 2024-01-30 VITALS — BP 115/85 | HR 88 | Temp 98.0°F | Resp 18

## 2024-01-30 DIAGNOSIS — J9601 Acute respiratory failure with hypoxia: Secondary | ICD-10-CM | POA: Diagnosis not present

## 2024-01-30 DIAGNOSIS — G620 Drug-induced polyneuropathy: Secondary | ICD-10-CM | POA: Diagnosis not present

## 2024-01-30 DIAGNOSIS — G4730 Sleep apnea, unspecified: Secondary | ICD-10-CM | POA: Diagnosis not present

## 2024-01-30 DIAGNOSIS — E039 Hypothyroidism, unspecified: Secondary | ICD-10-CM | POA: Diagnosis not present

## 2024-01-30 DIAGNOSIS — K219 Gastro-esophageal reflux disease without esophagitis: Secondary | ICD-10-CM | POA: Diagnosis not present

## 2024-01-30 DIAGNOSIS — C20 Malignant neoplasm of rectum: Secondary | ICD-10-CM | POA: Diagnosis not present

## 2024-01-30 DIAGNOSIS — F17210 Nicotine dependence, cigarettes, uncomplicated: Secondary | ICD-10-CM | POA: Diagnosis not present

## 2024-01-30 DIAGNOSIS — E785 Hyperlipidemia, unspecified: Secondary | ICD-10-CM | POA: Diagnosis not present

## 2024-01-30 DIAGNOSIS — I700 Atherosclerosis of aorta: Secondary | ICD-10-CM | POA: Diagnosis not present

## 2024-01-30 DIAGNOSIS — Z5111 Encounter for antineoplastic chemotherapy: Secondary | ICD-10-CM | POA: Diagnosis not present

## 2024-01-30 DIAGNOSIS — E1122 Type 2 diabetes mellitus with diabetic chronic kidney disease: Secondary | ICD-10-CM | POA: Diagnosis not present

## 2024-01-30 DIAGNOSIS — N200 Calculus of kidney: Secondary | ICD-10-CM | POA: Diagnosis not present

## 2024-01-30 DIAGNOSIS — Z5986 Financial insecurity: Secondary | ICD-10-CM | POA: Diagnosis not present

## 2024-01-30 DIAGNOSIS — Z79899 Other long term (current) drug therapy: Secondary | ICD-10-CM | POA: Diagnosis not present

## 2024-01-30 DIAGNOSIS — K5900 Constipation, unspecified: Secondary | ICD-10-CM | POA: Diagnosis not present

## 2024-01-30 DIAGNOSIS — E1140 Type 2 diabetes mellitus with diabetic neuropathy, unspecified: Secondary | ICD-10-CM | POA: Diagnosis not present

## 2024-01-30 DIAGNOSIS — J441 Chronic obstructive pulmonary disease with (acute) exacerbation: Secondary | ICD-10-CM | POA: Diagnosis not present

## 2024-01-30 DIAGNOSIS — I10 Essential (primary) hypertension: Secondary | ICD-10-CM | POA: Diagnosis not present

## 2024-01-30 DIAGNOSIS — J449 Chronic obstructive pulmonary disease, unspecified: Secondary | ICD-10-CM | POA: Diagnosis not present

## 2024-01-30 DIAGNOSIS — Z7984 Long term (current) use of oral hypoglycemic drugs: Secondary | ICD-10-CM | POA: Diagnosis not present

## 2024-01-30 DIAGNOSIS — T451X5A Adverse effect of antineoplastic and immunosuppressive drugs, initial encounter: Secondary | ICD-10-CM | POA: Diagnosis not present

## 2024-01-30 DIAGNOSIS — D122 Benign neoplasm of ascending colon: Secondary | ICD-10-CM | POA: Diagnosis not present

## 2024-01-30 MED ORDER — SODIUM CHLORIDE 0.9% FLUSH
10.0000 mL | INTRAVENOUS | Status: DC | PRN
  Administered 2024-01-30: 10 mL
  Filled 2024-01-30: qty 10

## 2024-01-30 MED ORDER — HEPARIN SOD (PORK) LOCK FLUSH 100 UNIT/ML IV SOLN
500.0000 [IU] | Freq: Once | INTRAVENOUS | Status: AC | PRN
  Administered 2024-01-30: 500 [IU]
  Filled 2024-01-30: qty 5

## 2024-02-01 ENCOUNTER — Other Ambulatory Visit: Payer: Self-pay | Admitting: Family Medicine

## 2024-02-01 DIAGNOSIS — E1169 Type 2 diabetes mellitus with other specified complication: Secondary | ICD-10-CM

## 2024-02-02 DIAGNOSIS — C20 Malignant neoplasm of rectum: Secondary | ICD-10-CM | POA: Diagnosis not present

## 2024-02-04 ENCOUNTER — Ambulatory Visit: Payer: 59

## 2024-02-04 ENCOUNTER — Telehealth: Payer: Self-pay | Admitting: *Deleted

## 2024-02-04 ENCOUNTER — Ambulatory Visit: Payer: 59 | Admitting: Oncology

## 2024-02-04 ENCOUNTER — Other Ambulatory Visit: Payer: 59

## 2024-02-04 NOTE — Telephone Encounter (Signed)
 Requested Prescriptions  Pending Prescriptions Disp Refills   rosuvastatin  (CRESTOR ) 40 MG tablet [Pharmacy Med Name: ROSUVASTATIN  CALCIUM  40 MG TAB] 90 tablet 0    Sig: TAKE 1 TABLET BY MOUTH EVERY MORNING     Cardiovascular:  Antilipid - Statins 2 Failed - 02/04/2024  9:25 AM      Failed - Cr in normal range and within 360 days    Creatinine  Date Value Ref Range Status  01/28/2024 1.19 (H) 0.44 - 1.00 mg/dL Final   Creat  Date Value Ref Range Status  10/05/2022 1.08 (H) 0.50 - 0.99 mg/dL Final   Creatinine, Urine  Date Value Ref Range Status  06/04/2023 39 20 - 275 mg/dL Final         Failed - Lipid Panel in normal range within the last 12 months    Cholesterol  Date Value Ref Range Status  06/04/2023 204 (H) <200 mg/dL Final  16/09/9603 540 0 - 200 mg/dL Final   Ldl Cholesterol, Calc  Date Value Ref Range Status  01/08/2015 96 0 - 100 mg/dL Final   LDL Cholesterol (Calc)  Date Value Ref Range Status  06/04/2023 114 (H) mg/dL (calc) Final    Comment:    Reference range: <100 . Desirable range <100 mg/dL for primary prevention;   <70 mg/dL for patients with CHD or diabetic patients  with > or = 2 CHD risk factors. Aaron Aas LDL-C is now calculated using the Martin-Hopkins  calculation, which is a validated novel method providing  better accuracy than the Friedewald equation in the  estimation of LDL-C.  Melinda Sprawls et al. Erroll Heard. 9811;914(78): 2061-2068  (http://education.[employer].com/faq/FAQ164)    HDL Cholesterol  Date Value Ref Range Status  01/08/2015 58 40 - 60 mg/dL Final   HDL  Date Value Ref Range Status  06/04/2023 45 (L) > OR = 50 mg/dL Final   Triglycerides  Date Value Ref Range Status  06/04/2023 339 (H) <150 mg/dL Final    Comment:    . If a non-fasting specimen was collected, consider repeat triglyceride testing on a fasting specimen if clinically indicated.  Imagene Mam et al. J. of Clin. Lipidol. 2015;9:129-169. Aaron Aas   01/08/2015 210 (H) 0 -  200 mg/dL Final         Passed - Patient is not pregnant      Passed - Valid encounter within last 12 months    Recent Outpatient Visits           3 months ago Type 2 diabetes mellitus with stage 3a chronic kidney disease, without long-term current use of insulin  [facility])   [facility] [place], Arvis Laura, MD   8 months ago Type 2 diabetes mellitus with diabetic microalbuminuria, with long-term current use of insulin  [facility])   [facility] Arleen Lacer, MD   9 months ago Vaginal discharge   [facility] Donny Gall F, FNP   1 year ago Type 2 diabetes mellitus with microalbuminuria, without long-term current use of insulin  [facility])   [place] Arleen Lacer, MD   1 year ago Type 2 diabetes mellitus with microalbuminuria, without long-term current use of insulin  [facility])   [facility] Arleen Lacer, MD       Future Appointments             Tomorrow Sowles, Krichna, MD [facility], [facility]

## 2024-02-04 NOTE — Telephone Encounter (Signed)
 Patient called to say that she is going to see her PCP tomorrow for probably a sinus infection at 2 PM and also she says her lungs are cloudy she is having some coughing and sometimes some night sweats.  She wanted let the doctor here know about this,

## 2024-02-05 ENCOUNTER — Ambulatory Visit: Payer: 59 | Admitting: Family Medicine

## 2024-02-05 ENCOUNTER — Ambulatory Visit (INDEPENDENT_AMBULATORY_CARE_PROVIDER_SITE_OTHER): Payer: 59 | Admitting: Family Medicine

## 2024-02-05 ENCOUNTER — Other Ambulatory Visit (HOSPITAL_COMMUNITY): Payer: Self-pay

## 2024-02-05 ENCOUNTER — Encounter: Payer: Self-pay | Admitting: Family Medicine

## 2024-02-05 VITALS — BP 128/82 | HR 97 | Resp 16 | Ht 59.0 in | Wt 135.9 lb

## 2024-02-05 DIAGNOSIS — C20 Malignant neoplasm of rectum: Secondary | ICD-10-CM | POA: Diagnosis not present

## 2024-02-05 DIAGNOSIS — E440 Moderate protein-calorie malnutrition: Secondary | ICD-10-CM | POA: Diagnosis not present

## 2024-02-05 DIAGNOSIS — G4733 Obstructive sleep apnea (adult) (pediatric): Secondary | ICD-10-CM | POA: Diagnosis not present

## 2024-02-05 DIAGNOSIS — C787 Secondary malignant neoplasm of liver and intrahepatic bile duct: Secondary | ICD-10-CM

## 2024-02-05 DIAGNOSIS — F3132 Bipolar disorder, current episode depressed, moderate: Secondary | ICD-10-CM

## 2024-02-05 DIAGNOSIS — J0100 Acute maxillary sinusitis, unspecified: Secondary | ICD-10-CM

## 2024-02-05 DIAGNOSIS — J411 Mucopurulent chronic bronchitis: Secondary | ICD-10-CM

## 2024-02-05 DIAGNOSIS — E1122 Type 2 diabetes mellitus with diabetic chronic kidney disease: Secondary | ICD-10-CM

## 2024-02-05 DIAGNOSIS — N1831 Chronic kidney disease, stage 3a: Secondary | ICD-10-CM

## 2024-02-05 DIAGNOSIS — I700 Atherosclerosis of aorta: Secondary | ICD-10-CM | POA: Diagnosis not present

## 2024-02-05 DIAGNOSIS — E032 Hypothyroidism due to medicaments and other exogenous substances: Secondary | ICD-10-CM | POA: Diagnosis not present

## 2024-02-05 LAB — POCT GLYCOSYLATED HEMOGLOBIN (HGB A1C): Hemoglobin A1C: 7.3 % — AB (ref 4.0–5.6)

## 2024-02-05 MED ORDER — BREZTRI AEROSPHERE 160-9-4.8 MCG/ACT IN AERO: 2.0000 | INHALATION_SPRAY | Freq: Two times a day (BID) | RESPIRATORY_TRACT | 2 refills | Status: DC

## 2024-02-05 MED ORDER — DOXYCYCLINE HYCLATE 100 MG PO TABS: 100.0000 mg | ORAL_TABLET | Freq: Two times a day (BID) | ORAL | 0 refills | Status: DC

## 2024-02-05 NOTE — Progress Notes (Signed)
Name: April Luna   MRN: [id number]    DOB: 04/03/1971   Date:02/05/2024       Progress Note  Subjective  Chief Complaint  Chief Complaint  Patient presents with   Medical Management of Chronic Issues   Facial Pain   Nasal Congestion   Headache    Almost 2 weeks   HPI   DM:  she is currently taking Actos and Farxiga. She denies side effects. She is having polyphagia, polydipsia and polyuria. Last A1C was down from 7.4% to  6.5 % to 5.9 %, 6.2 % but today is up to 7.3 %   She has associated dyslipidemia and HTN, CKI stage III a and microalbuminuria ( treated with ARB and SGL-2 agonist)  She takes statin therapy . We will stop ARB today due to low bp and dizziness She states not as compliant with her diet lately, she will try to eat healthier again   Mucopurulent Chronic Bronchitis :  She states she still has a productive cough that is not as thick and has been lighter in color.  She also had a sleep study that was positive for OSA but now she said unable to tolerate CPAP while at [facility]. She quit smoking but resumed smoking again since Nov 2023 , she is smoking 15 cigarettes daily. She was recently given prednisone taper by oncologist for wheezing, still wheezing today, has a productive cough and sinus pressure and post nasal drainage. We will treat her with antibiotics but also needs to see pulmonologist    HTN: she is on losartan and cardizem, bp is at goal, denies chest pain , palpitation but has stable sob  Hyperlipidemia/Atherosclerosis of aorta : she is now getting medication delivered pre-packaged to her house and has been more compliant, on crestor and vascepa  We will recheck labs in June    GERD: she has been asymptomatic on Pantoprazole given by Dr. Allegra Lai . Doing well    Bipolar affective disorder: she is seeing psychiatrist Dr. Mervyn Skeeters at [facility], she came in alone today ( dropped off by her friend) .   Last admission was 2016 for a psychotic episode. No history of suicide  attempts. Keep follow up with psychiatrist Stable   Hypothyroidism: she is taking levothyroxine and denies hair loss, no change in bowel movement, she has chronic dry skin. She states started to take levothyroxine due to lithium therapy years ago  TSH has been at goal, we will recheck yearly  Chronic right lower back pain: going on for years, she used to see chiropractor but cannot afford it,  seems to be right on top of sacro- iliac joint, she states occasionally shoots down to her right lower leg. Taking gabapentin and Tylenol and pain is stable   Psoriasis: seen by dermatologist in the past, Dr. Orson Aloe, she is now getting otc shampoo and is doing well controlling scalp itching . Unchanged    Rectal cancer: found during screening colonoscopy, diagnosed 01/2022 , she had robotic resection April 2023 , had radiation therapy and completed chemotherapy session Dec 2023, CT showed mets to liver ( biopsy proven ) and also lung lesions, she resumed another round of chemo June 2024. Up to date with visits with oncologist, CEA level is stable    Malnutrition: she had lost over 10 % of her weight since Oct 2022, weight was 151 lbs it went down to 130 lbs. She is doing better now, gaining a little weight now, up to 142 lbs  but today is down again to 135.9 lbs, she is trying to snack more often on high protein diet   Patient Active Problem List   Diagnosis Date Noted   Dysuria 09/17/2023   Mucositis 08/13/2023   Tooth pain 08/13/2023   Skin rash 07/30/2023   Chemotherapy induced diarrhea 07/16/2023   Rectal bleeding 03/05/2023   Port-A-Cath in place 03/05/2023   Chemotherapy-induced neuropathy (HCC) 10/24/2022   Tachycardia 09/22/2022   Atherosclerosis of aorta (HCC) 07/17/2022   Stage 3a chronic kidney disease (HCC) 07/17/2022   Elevated serum creatinine 06/23/2022   Tobacco use 05/26/2022   Encounter for antineoplastic chemotherapy 05/17/2022   Goals of care, counseling/discussion 05/03/2022    Rectal cancer (HCC) 04/21/2022   Genetic testing 03/27/2022   Family history of colon cancer 03/14/2022   Family history of uterine cancer 03/14/2022   Family history of breast cancer 03/14/2022   Polyp of ascending colon    Obstructive sleep apnea syndrome 08/11/2021   Plantar callus 10/06/2019   Acquired hallux limitus of both feet 10/06/2019   Type 2 diabetes mellitus with diabetic microalbuminuria, with long-term current use of insulin (HCC) 06/24/2019   COPD (chronic obstructive pulmonary disease) (HCC) 06/24/2019   Chronic constipation 06/24/2019   ASCUS of cervix with negative high risk HPV 09/05/2018   GERD without esophagitis 04/11/2018   Hyperlipidemia 04/11/2018   Essential hypertension 04/11/2018   Hypothyroidism 04/26/2015   Bipolar I disorder, most recent episode (or current) manic (HCC) 04/25/2015   Cannabis abuse 04/25/2015    Past Surgical History:  Procedure Laterality Date   BREAST BIOPSY Left 12/14/2021   [place] bx, venus marker, path pending   CESAREAN SECTION     COLONOSCOPY WITH PROPOFOL N/A 02/09/2022   Procedure: COLONOSCOPY WITH PROPOFOL;  Surgeon: Toney Reil, MD;  Location: [facility];  Service: Endoscopy;  Laterality: N/A;  sleep apnea   CYSTOSCOPY W/ RETROGRADES Left 11/08/2018   Procedure: CYSTOSCOPY WITH RETROGRADE PYELOGRAM;  Surgeon: Sondra Come, MD;  Location: [facility];  Service: Urology;  Laterality: Left;   CYSTOSCOPY/URETEROSCOPY/HOLMIUM LASER/STENT PLACEMENT Left 11/08/2018   Procedure: CYSTOSCOPY/URETEROSCOPY/HOLMIUM LASER/STENT PLACEMENT;  Surgeon: Sondra Come, MD;  Location: [facility];  Service: Urology;  Laterality: Left;   IR CV LINE INJECTION  06/25/2023   IR IMAGING GUIDED PORT INSERTION  05/11/2022   IR REMOVE CV FIBRIN SHEATH  06/27/2023   MOUTH SURGERY     wisdom teeth extraction   MOUTH SURGERY     teeth removal   POLYPECTOMY N/A 02/09/2022   Procedure: POLYPECTOMY;  Surgeon: Toney Reil, MD;   Location: [facility];  Service: Endoscopy;  Laterality: N/A;   XI ROBOTIC ASSISTED LOWER ANTERIOR RESECTION N/A 04/21/2022   Procedure: XI ROBOTIC ASSISTED LOWER ANTERIOR RESECTION WITH COLOSTOMY, BILATERAL TAP BLOCK, ASSESSMENT OF TISSUE PERFUSSION WITH FIREFLY INJECTION;  Surgeon: Andria Meuse, MD;  Location: [facility];  Service: General;  Laterality: N/A;    Family History  Problem Relation Age of Onset   Depression Mother    Anxiety disorder Mother    Diabetes Mother    Hypertension Mother    Hyperlipidemia Mother    Cancer Mother    Uterine cancer Mother 68   Cervical cancer Mother 17   Colon cancer Father    Depression Brother    Anxiety disorder Brother    Cancer Maternal Aunt        unk types   Diabetes Mellitus II Maternal Grandmother    Hypercholesterolemia Maternal Grandmother  Breast cancer Maternal Grandmother    Cancer Paternal Grandmother    Diabetes Paternal Grandmother    Melanoma Paternal Grandmother    Stomach cancer Paternal Grandmother     Social History   Tobacco Use   Smoking status: Some Days    Current packs/day: 0.25    Average packs/day: 0.2 packs/day for 38.7 years (9.7 ttl pk-yrs)    Types: Cigarettes    Start date: 05/13/1985    Passive exposure: Current   Smokeless tobacco: Never   Tobacco comments:    5-6 cigarettes weekly- 11/07/2022  Substance Use Topics   Alcohol use: Not Currently    Alcohol/week: 4.0 standard drinks of alcohol    Types: 4 Glasses of wine per week    Comment: quit ETOH in Feb. 2023     Current Outpatient Medications:    acetaminophen (TYLENOL) 325 MG tablet, Take 650 mg by mouth every 6 (six) hours as needed for headache (pain)., Disp: , Rfl:    albuterol (VENTOLIN HFA) 108 (90 Base) MCG/ACT inhaler, Inhale 2 puffs into the lungs every 6 (six) hours as needed for wheezing or shortness of breath., Disp: 1 each, Rfl: 5   amantadine (SYMMETREL) 100 MG capsule, Take 100 mg by mouth 2 (two) times daily.,  Disp: , Rfl:    blood glucose meter kit and supplies, Dispense based on patient and insurance preference. Use up to four times daily as directed. (FOR ICD-10 E10.9, E11.9)., Disp: 1 each, Rfl: 0   Budeson-Glycopyrrol-Formoterol (BREZTRI AEROSPHERE) 160-9-4.8 MCG/ACT AERO, Inhale 2 puffs into the lungs in the morning and at bedtime., Disp: 5.9 g, Rfl: 1   buPROPion (WELLBUTRIN XL) 300 MG 24 hr tablet, Take 300 mg by mouth daily., Disp: , Rfl:    Cholecalciferol (VITAMIN D-3) 125 MCG (5000 UT) TABS, Take 5,000 Units by mouth daily., Disp: 30 tablet, Rfl: 1   clonazePAM (KLONOPIN) 0.5 MG tablet, Take 1 mg by mouth 2 (two) times daily as needed., Disp: , Rfl:    dapagliflozin propanediol (FARXIGA) 10 MG TABS tablet, TAKE 1 TABLET BY MOUTH DAILY BEFORE BREAKFAST, Disp: 90 tablet, Rfl: 0   dexamethasone (DECADRON) 4 MG tablet, Take 2 tablets (8 mg total) by mouth See admin instructions. Take 8mg  daily for 2 days after each chemotherapy., Disp: 30 tablet, Rfl: 1   diltiazem (CARDIZEM CD) 120 MG 24 hr capsule, TAKE 1 CAPSULE BY MOUTH DAILY, Disp: 90 capsule, Rfl: 1   docusate sodium (COLACE) 100 MG capsule, TAKE 1 CAPSULE BY MOUTH TWICE DAILY, Disp: 60 capsule, Rfl: 2   gabapentin (NEURONTIN) 300 MG capsule, Take 300 mg by mouth 2 (two) times daily., Disp: , Rfl:    icosapent Ethyl (VASCEPA) 1 g capsule, TAKE 2 CAPSULES BY MOUTH 2 TIMES DAILY, Disp: 120 capsule, Rfl: 3   levothyroxine (SYNTHROID) 75 MCG tablet, TAKE 1 TABLET BY MOUTH DAILY BEFORE BREAKFAST, Disp: 90 tablet, Rfl: 2   lidocaine-prilocaine (EMLA) cream, Apply to affected area once, Disp: 30 g, Rfl: 3   loperamide (IMODIUM) 2 MG capsule, Take 1 capsule (2 mg total) by mouth See admin instructions. Initial: 4 mg,the 2 mg every 2 hours (4 mg every 4 hours at night)  maximum: 16 mg/day, Disp: 60 capsule, Rfl: 2   loratadine (CLARITIN) 10 MG tablet, Take 10 mg by mouth every morning., Disp: , Rfl:    magic mouthwash w/lidocaine SOLN, Take 5 mLs by  mouth 4 (four) times daily as needed for mouth pain. Sig: Swish/Swallow 5-10 ml four times  a day as needed. Dispense 480 ml. 1RF, Disp: 480 mL, Rfl: 1   ondansetron (ZOFRAN) 8 MG tablet, Take 1 tablet (8 mg total) by mouth every 8 (eight) hours as needed for nausea, vomiting or refractory nausea / vomiting. Start on the third day after chemotherapy., Disp: 30 tablet, Rfl: 1   paliperidone (INVEGA SUSTENNA) 234 MG/1.5ML SUSY injection, Inject 234 mg into the muscle every 30 (thirty) days. On or about the 14th of each month, Disp: , Rfl:    pantoprazole (PROTONIX) 40 MG tablet, TAKE 1 TABLET BY MOUTH DAILY, Disp: 90 tablet, Rfl: 2   pioglitazone (ACTOS) 15 MG tablet, Take 1 tablet (15 mg total) by mouth daily., Disp: 90 tablet, Rfl: 1   predniSONE (STERAPRED UNI-PAK 21 TAB) 10 MG (21) TBPK tablet, Day 1 take 6 tablets, Day 2 take 5 tablets, Day 3 take 4 tablets, Day 4 take 3 tablets, Day 5 take 2 tablets D6 take 1 tablet, Disp: 1 each, Rfl: 0   promethazine (PHENERGAN) 12.5 MG tablet, Take 1 tablet (12.5 mg total) by mouth every 6 (six) hours as needed for nausea or vomiting., Disp: 30 tablet, Rfl: 0   rosuvastatin (CRESTOR) 40 MG tablet, TAKE 1 TABLET BY MOUTH EVERY MORNING, Disp: 90 tablet, Rfl: 0   sertraline (ZOLOFT) 100 MG tablet, Take 200 mg by mouth every morning., Disp: , Rfl:    Spacer/Aero-Holding Chambers (AEROCHAMBER MV) inhaler, Use as instructed, Disp: 1 each, Rfl: 0   Vibegron (GEMTESA) 75 MG TABS, Take 1 tablet (75 mg total) by mouth daily., Disp: , Rfl:  No current facility-administered medications for this visit.  Facility-Administered Medications Ordered in Other Visits:    albuterol (PROVENTIL) (2.5 MG/3ML) 0.083% nebulizer solution 2.5 mg, 2.5 mg, Nebulization, Once, Dgayli, Khabib, MD   sodium chloride flush (NS) 0.9 % injection 10 mL, 10 mL, Intracatheter, PRN, [name], MD, 10 mL at 08/15/23 1327  Allergies  Allergen Reactions   Metformin And Related Nausea And Vomiting    Nsaids Other (See Comments)    Stage 3 CKD   Perphenazine Other (See Comments)    Tremors, muscle weakness, tongue swelling   Sulfa Antibiotics Other (See Comments)    GI distress    Abilify [Aripiprazole] Rash   Penicillins Rash    She has taken amoxicillin without problems    I personally reviewed active problem list, medication list, allergies with the patient/caregiver today.   ROS  Ten systems reviewed and is negative except as mentioned in HPI    Objective  Vitals:   02/05/24 1405  BP: 128/82  Pulse: 97  Resp: 16  SpO2: 97%  Weight: 135 lb 14.4 oz (61.6 kg)  Height: 4\' 11"  (1.499 m)    Body mass index is 27.45 kg/m.  Physical Exam  Constitutional: Patient appears well-developed and well-nourished.  No distress.  HEENT: head atraumatic, normocephalic, pupils equal and reactive to light, ears normal TM on left, opaque on right, neck supple, throat within normal limits, left nostril has some drainage Cardiovascular: Normal rate, regular rhythm and normal heart sounds.  No murmur heard. No BLE edema. Pulmonary/Chest: Effort normal bilatearl inspiratory wheezing,no rhonchi or crackles  Abdominal: Soft.  There is no tenderness. Psychiatric: Patient has a normal mood and affect. behavior is normal. Judgment and thought content normal.   Recent Results (from the past 2160 hours)  Urinalysis, Complete w Microscopic     Status: Abnormal   Collection Time: 11/19/23 10:56 AM  Result Value Ref Range  Color, Urine STRAW (A) YELLOW   APPearance CLEAR (A) CLEAR   Specific Gravity, Urine 1.013 1.005 - 1.030   pH 6.0 5.0 - 8.0   Glucose, UA >=500 (A) NEGATIVE mg/dL   Hgb urine dipstick NEGATIVE NEGATIVE   Bilirubin Urine NEGATIVE NEGATIVE   Ketones, ur NEGATIVE NEGATIVE mg/dL   Protein, ur NEGATIVE NEGATIVE mg/dL   Nitrite NEGATIVE NEGATIVE   Leukocytes,Ua NEGATIVE NEGATIVE   RBC / HPF 0-5 0 - 5 RBC/hpf   WBC, UA 0-5 0 - 5 WBC/hpf   Bacteria, UA NONE SEEN NONE SEEN    Squamous Epithelial / HPF 0-5 0 - 5 /HPF    Comment: Performed at [facility], [address], [place], [place] [address]  Urine Culture     Status: None   Collection Time: 11/19/23 10:56 AM   Specimen: Urine, Clean Catch  Result Value Ref Range   Specimen Description      URINE, CLEAN CATCH Performed at [facility], [address] [place], [place] [address]    Special Requests      NONE Performed at [facility], [address], [place], [place] [address]    Culture      NO GROWTH Performed at [facility], [address], [place], [place] [address]    Report Status 11/20/2023 FINAL   CMP (Cancer Center only)     Status: Abnormal   Collection Time: 12/03/23 10:34 AM  Result Value Ref Range   Sodium 135 135 - 145 mmol/L   Potassium 3.6 3.5 - 5.1 mmol/L   Chloride 101 98 - 111 mmol/L   CO2 25 22 - 32 mmol/L   Glucose, Bld 134 (H) 70 - 99 mg/dL    Comment: Glucose reference range applies only to samples taken after fasting for at least 8 hours.   BUN 19 6 - 20 mg/dL   Creatinine 2.95 6.21 - 1.00 mg/dL   Calcium 8.8 (L) 8.9 - 10.3 mg/dL   Total Protein 7.2 6.5 - 8.1 g/dL   Albumin 4.0 3.5 - 5.0 g/dL   AST 15 15 - 41 U/L   ALT 15 0 - 44 U/L   Alkaline Phosphatase 69 38 - 126 U/L   Total Bilirubin 0.5 <1.2 mg/dL   GFR, Estimated >30 >86 mL/min    Comment: (NOTE) Calculated using the CKD-EPI Creatinine Equation (2021)    Anion gap 9 5 - 15    Comment: Performed at [facility], [address] [place], [place] [address]  CBC with Differential (Cancer Center Only)     Status: Abnormal   Collection Time: 12/03/23 10:34 AM  Result Value Ref Range   WBC Count 10.2 4.0 - 10.5 K/uL   RBC 4.36 3.87 - 5.11 MIL/uL   Hemoglobin 14.3 12.0 - 15.0 g/dL   HCT 96.2 95.2 - 84.1 %   MCV 98.4 80.0 - 100.0 fL   MCH 32.8 26.0 - 34.0 pg   MCHC 33.3 30.0 - 36.0 g/dL   RDW 32.4 40.1 - 02.7 %   Platelet Count 132 (L) 150 - 400 K/uL   nRBC 0.0 0.0 - 0.2  %   Neutrophils Relative % 82 %   Neutro Abs 8.3 (H) 1.7 - 7.7 K/uL   Lymphocytes Relative 8 %   Lymphs Abs 0.9 0.7 - 4.0 K/uL   Monocytes Relative 9 %   Monocytes Absolute 0.9 0.1 - 1.0 K/uL   Eosinophils Relative 1 %   Eosinophils  Absolute 0.1 0.0 - 0.5 K/uL   Basophils Relative 0 %   Basophils Absolute 0.0 0.0 - 0.1 K/uL   Immature Granulocytes 0 %   Abs Immature Granulocytes 0.04 0.00 - 0.07 K/uL    Comment: Performed at [facility], [address] [place], [place] [address]  CEA     Status: None   Collection Time: 12/03/23 10:34 AM  Result Value Ref Range   CEA 4.7 0.0 - 4.7 ng/mL    Comment: (NOTE)                             Nonsmokers          <3.9                             Smokers             <5.6 [employer] Electrochemiluminescence Immunoassay (ECLIA) Values obtained with different assay methods or kits cannot be used interchangeably.  Results cannot be interpreted as absolute evidence of the presence or absence of malignant disease. Performed At: [facility] [address] [place], [place] [id number] Jolene Schimke MD [id number]   Protein, urine, random     Status: None   Collection Time: 12/03/23 10:35 AM  Result Value Ref Range   Total Protein, Urine 7 mg/dL    Comment: NO NORMAL RANGE ESTABLISHED FOR THIS TEST Performed at [facility], [address] [place], [place] [address]   Urinalysis, Complete     Status: Abnormal   Collection Time: 12/11/23  1:59 PM  Result Value Ref Range   Specific Gravity, UA <1.005 (L) 1.005 - 1.030   pH, UA 6.0 5.0 - 7.5   Color, UA Yellow Yellow   Appearance Ur Clear Clear   Leukocytes,UA Trace (A) Negative   Protein,UA Negative Negative/Trace   Glucose, UA Trace (A) Negative   Ketones, UA Negative Negative   RBC, UA Negative Negative   Bilirubin, UA Negative Negative   Urobilinogen, Ur 0.2 0.2 - 1.0 mg/dL   Nitrite, UA Negative Negative   Microscopic Examination See below:    Microscopic Examination     Status: Abnormal   Collection Time: 12/11/23  1:59 PM   Urine  Result Value Ref Range   WBC, UA 6-10 (A) 0 - 5 /hpf   RBC, Urine 0-2 0 - 2 /hpf   Epithelial Cells (non renal) >10 (A) 0 - 10 /hpf   Bacteria, UA Few None seen/Few  BLADDER SCAN AMB NON-IMAGING     Status: None   Collection Time: 12/11/23  2:07 PM  Result Value Ref Range   Scan Result 0ml   CMP (Cancer Center only)     Status: Abnormal   Collection Time: 12/24/23 10:32 AM  Result Value Ref Range   Sodium 137 135 - 145 mmol/L   Potassium 3.6 3.5 - 5.1 mmol/L   Chloride 101 98 - 111 mmol/L   CO2 27 22 - 32 mmol/L   Glucose, Bld 155 (H) 70 - 99 mg/dL    Comment: Glucose reference range applies only to samples taken after fasting for at least 8 hours.   BUN 10 6 - 20 mg/dL   Creatinine 1.60 (H) 7.37 - 1.00 mg/dL   Calcium 8.6 (L) 8.9 - 10.3 mg/dL   Total Protein 6.9 6.5 - 8.1 g/dL   Albumin 3.8 3.5 - 5.0 g/dL  AST 19 15 - 41 U/L   ALT 14 0 - 44 U/L   Alkaline Phosphatase 67 38 - 126 U/L   Total Bilirubin 0.4 0.0 - 1.2 mg/dL   GFR, Estimated >40 >98 mL/min    Comment: (NOTE) Calculated using the CKD-EPI Creatinine Equation (2021)    Anion gap 9 5 - 15    Comment: Performed at [facility], [address] [place], [place] [address]  CBC with Differential (Cancer Center Only)     Status: Abnormal   Collection Time: 12/24/23 10:32 AM  Result Value Ref Range   WBC Count 6.0 4.0 - 10.5 K/uL   RBC 4.39 3.87 - 5.11 MIL/uL   Hemoglobin 14.0 12.0 - 15.0 g/dL   HCT 78.2 95.6 - 21.3 %   MCV 98.2 80.0 - 100.0 fL   MCH 31.9 26.0 - 34.0 pg   MCHC 32.5 30.0 - 36.0 g/dL   RDW 08.6 57.8 - 46.9 %   Platelet Count 139 (L) 150 - 400 K/uL   nRBC 0.0 0.0 - 0.2 %   Neutrophils Relative % 62 %   Neutro Abs 3.7 1.7 - 7.7 K/uL   Lymphocytes Relative 21 %   Lymphs Abs 1.2 0.7 - 4.0 K/uL   Monocytes Relative 14 %   Monocytes Absolute 0.8 0.1 - 1.0 K/uL   Eosinophils Relative 2 %   Eosinophils  Absolute 0.1 0.0 - 0.5 K/uL   Basophils Relative 1 %   Basophils Absolute 0.1 0.0 - 0.1 K/uL   Immature Granulocytes 0 %   Abs Immature Granulocytes 0.02 0.00 - 0.07 K/uL    Comment: Performed at [facility], [address] [place], [place] [address]  CEA     Status: None   Collection Time: 12/24/23 10:32 AM  Result Value Ref Range   CEA 4.3 0.0 - 4.7 ng/mL    Comment: (NOTE)                             Nonsmokers          <3.9                             Smokers             <5.6 [employer] Electrochemiluminescence Immunoassay (ECLIA) Values obtained with different assay methods or kits cannot be used interchangeably.  Results cannot be interpreted as absolute evidence of the presence or absence of malignant disease. Performed At: [facility] [address] [place], [place] [id number] Jolene Schimke MD UU:[id number]   Protein, urine, random     Status: None   Collection Time: 12/24/23 11:00 AM  Result Value Ref Range   Total Protein, Urine <6 mg/dL    Comment: NO NORMAL RANGE ESTABLISHED FOR THIS TEST Performed at [facility], [address] [place], [place] [address]   CMP (Cancer Center only)     Status: Abnormal   Collection Time: 01/07/24  8:19 AM  Result Value Ref Range   Sodium 138 135 - 145 mmol/L   Potassium 3.9 3.5 - 5.1 mmol/L   Chloride 104 98 - 111 mmol/L   CO2 24 22 - 32 mmol/L   Glucose, Bld 175 (H) 70 - 99 mg/dL    Comment: Glucose reference range applies only to samples taken after fasting for at least 8 hours.   BUN 16 6 -  20 mg/dL   Creatinine 4.40 3.47 - 1.00 mg/dL   Calcium 8.9 8.9 - 42.5 mg/dL   Total Protein 7.0 6.5 - 8.1 g/dL   Albumin 4.0 3.5 - 5.0 g/dL   AST 19 15 - 41 U/L   ALT 13 0 - 44 U/L   Alkaline Phosphatase 74 38 - 126 U/L   Total Bilirubin 0.5 0.0 - 1.2 mg/dL   GFR, Estimated >95 >63 mL/min    Comment: (NOTE) Calculated using the CKD-EPI Creatinine Equation (2021)    Anion gap 10 5 - 15     Comment: Performed at [facility], [address] [place], [place] [address]  CBC with Differential (Cancer Center Only)     Status: Abnormal   Collection Time: 01/07/24  8:19 AM  Result Value Ref Range   WBC Count 3.9 (L) 4.0 - 10.5 K/uL   RBC 4.55 3.87 - 5.11 MIL/uL   Hemoglobin 14.7 12.0 - 15.0 g/dL   HCT 33.2 95.1 - 88.4 %   MCV 95.6 80.0 - 100.0 fL   MCH 32.3 26.0 - 34.0 pg   MCHC 33.8 30.0 - 36.0 g/dL   RDW 16.6 06.3 - 01.6 %   Platelet Count 152 150 - 400 K/uL   nRBC 0.0 0.0 - 0.2 %   Neutrophils Relative % 64 %   Neutro Abs 2.5 1.7 - 7.7 K/uL   Lymphocytes Relative 20 %   Lymphs Abs 0.8 0.7 - 4.0 K/uL   Monocytes Relative 12 %   Monocytes Absolute 0.5 0.1 - 1.0 K/uL   Eosinophils Relative 3 %   Eosinophils Absolute 0.1 0.0 - 0.5 K/uL   Basophils Relative 1 %   Basophils Absolute 0.0 0.0 - 0.1 K/uL   Immature Granulocytes 0 %   Abs Immature Granulocytes 0.01 0.00 - 0.07 K/uL    Comment: Performed at [facility], [address] [place], [place] [address]  Protein, urine, random     Status: None   Collection Time: 01/07/24  8:19 AM  Result Value Ref Range   Total Protein, Urine 11 mg/dL    Comment: NO NORMAL RANGE ESTABLISHED FOR THIS TEST Performed at [facility], [address] [place], [place] [address]   CEA     Status: Abnormal   Collection Time: 01/07/24  8:19 AM  Result Value Ref Range   CEA 5.0 (H) 0.0 - 4.7 ng/mL    Comment: (NOTE)                             Nonsmokers          <3.9                             Smokers             <5.6 [employer] Electrochemiluminescence Immunoassay (ECLIA) Values obtained with different assay methods or kits cannot be used interchangeably.  Results cannot be interpreted as absolute evidence of the presence or absence of malignant disease. Performed At: [facility] [address] [place], [place] [id number] Jolene Schimke MD [id number]   CMP (Cancer Center only)      Status: Abnormal   Collection Time: 01/21/24  8:43 AM  Result Value Ref Range   Sodium 140 135 - 145 mmol/L   Potassium 3.4 (L) 3.5 - 5.1 mmol/L   Chloride 105 98 - 111 mmol/L  CO2 25 22 - 32 mmol/L   Glucose, Bld 197 (H) 70 - 99 mg/dL    Comment: Glucose reference range applies only to samples taken after fasting for at least 8 hours.   BUN 14 6 - 20 mg/dL   Creatinine 4.09 8.11 - 1.00 mg/dL   Calcium 9.2 8.9 - 91.4 mg/dL   Total Protein 6.6 6.5 - 8.1 g/dL   Albumin 3.8 3.5 - 5.0 g/dL   AST 24 15 - 41 U/L   ALT 16 0 - 44 U/L   Alkaline Phosphatase 75 38 - 126 U/L   Total Bilirubin 0.4 0.0 - 1.2 mg/dL   GFR, Estimated >78 >29 mL/min    Comment: (NOTE) Calculated using the CKD-EPI Creatinine Equation (2021)    Anion gap 10 5 - 15    Comment: Performed at [facility], [address] [place], [place] [address]  CBC with Differential (Cancer Center Only)     Status: Abnormal   Collection Time: 01/21/24  8:43 AM  Result Value Ref Range   WBC Count 5.0 4.0 - 10.5 K/uL   RBC 4.49 3.87 - 5.11 MIL/uL   Hemoglobin 14.3 12.0 - 15.0 g/dL   HCT 08.6 57.8 - 46.9 %   MCV 96.4 80.0 - 100.0 fL   MCH 31.8 26.0 - 34.0 pg   MCHC 33.0 30.0 - 36.0 g/dL   RDW 62.9 (H) 52.8 - 41.3 %   Platelet Count 120 (L) 150 - 400 K/uL   nRBC 0.0 0.0 - 0.2 %   Neutrophils Relative % 73 %   Neutro Abs 3.6 1.7 - 7.7 K/uL   Lymphocytes Relative 13 %   Lymphs Abs 0.7 0.7 - 4.0 K/uL   Monocytes Relative 11 %   Monocytes Absolute 0.6 0.1 - 1.0 K/uL   Eosinophils Relative 3 %   Eosinophils Absolute 0.1 0.0 - 0.5 K/uL   Basophils Relative 0 %   Basophils Absolute 0.0 0.0 - 0.1 K/uL   Immature Granulocytes 0 %   Abs Immature Granulocytes 0.02 0.00 - 0.07 K/uL    Comment: Performed at [facility], [address] [place], [place] [address]  Protein, urine, random     Status: None   Collection Time: 01/21/24  8:43 AM  Result Value Ref Range   Total Protein, Urine 56 mg/dL    Comment: NO NORMAL  RANGE ESTABLISHED FOR THIS TEST Performed at [facility], [address] [place], [place] [address]   CEA     Status: Abnormal   Collection Time: 01/21/24  8:43 AM  Result Value Ref Range   CEA 5.9 (H) 0.0 - 4.7 ng/mL    Comment: (NOTE)                             Nonsmokers          <3.9                             Smokers             <5.6 [employer] Electrochemiluminescence Immunoassay (ECLIA) Values obtained with different assay methods or kits cannot be used interchangeably.  Results cannot be interpreted as absolute evidence of the presence or absence of malignant disease. Performed At: [facility] [address] [place], [place] [id number] Jolene Schimke MD QQ:[id number]   [facility] only)  Status: Abnormal   Collection Time: 01/28/24  9:01 AM  Result Value Ref Range   Sodium 133 (L) 135 - 145 mmol/L   Potassium 3.4 (L) 3.5 - 5.1 mmol/L   Chloride 101 98 - 111 mmol/L   CO2 25 22 - 32 mmol/L   Glucose, Bld 184 (H) 70 - 99 mg/dL    Comment: Glucose reference range applies only to samples taken after fasting for at least 8 hours.   BUN 17 6 - 20 mg/dL   Creatinine 1.61 (H) 0.96 - 1.00 mg/dL   Calcium 8.9 8.9 - 04.5 mg/dL   Total Protein 6.9 6.5 - 8.1 g/dL   Albumin 4.0 3.5 - 5.0 g/dL   AST 21 15 - 41 U/L   ALT 19 0 - 44 U/L   Alkaline Phosphatase 74 38 - 126 U/L   Total Bilirubin 0.3 0.0 - 1.2 mg/dL   GFR, Estimated 55 (L) >60 mL/min    Comment: (NOTE) Calculated using the CKD-EPI Creatinine Equation (2021)    Anion gap 7 5 - 15    Comment: Performed at [facility], [address] [place], [place] [address]  CBC with Differential (Cancer Center Only)     Status: Abnormal   Collection Time: 01/28/24  9:01 AM  Result Value Ref Range   WBC Count 7.8 4.0 - 10.5 K/uL   RBC 4.27 3.87 - 5.11 MIL/uL   Hemoglobin 13.8 12.0 - 15.0 g/dL   HCT 19.1 47.8 - 29.5 %   MCV 96.3 80.0 - 100.0 fL   MCH 32.3 26.0 - 34.0 pg   MCHC 33.6  30.0 - 36.0 g/dL   RDW 62.1 (H) 30.8 - 65.7 %   Platelet Count 139 (L) 150 - 400 K/uL   nRBC 0.0 0.0 - 0.2 %   Neutrophils Relative % 65 %   Neutro Abs 5.1 1.7 - 7.7 K/uL   Lymphocytes Relative 19 %   Lymphs Abs 1.5 0.7 - 4.0 K/uL   Monocytes Relative 12 %   Monocytes Absolute 0.9 0.1 - 1.0 K/uL   Eosinophils Relative 1 %   Eosinophils Absolute 0.1 0.0 - 0.5 K/uL   Basophils Relative 0 %   Basophils Absolute 0.0 0.0 - 0.1 K/uL   Immature Granulocytes 3 %   Abs Immature Granulocytes 0.22 (H) 0.00 - 0.07 K/uL    Comment: Performed at [facility], [address] [place], [place] [address]  Protein, urine, random     Status: None   Collection Time: 01/28/24  9:01 AM  Result Value Ref Range   Total Protein, Urine 10 mg/dL    Comment: NO NORMAL RANGE ESTABLISHED FOR THIS TEST Performed at [facility], [address] [place], [place] [address]   CEA     Status: Abnormal   Collection Time: 01/28/24  9:01 AM  Result Value Ref Range   CEA 5.6 (H) 0.0 - 4.7 ng/mL    Comment: (NOTE)                             Nonsmokers          <3.9                             Smokers             <5.6 [employer] Electrochemiluminescence Immunoassay (ECLIA) Values obtained with different assay methods  or kits cannot be used interchangeably.  Results cannot be interpreted as absolute evidence of the presence or absence of malignant disease. Performed At: [facility] [address] [place], [place] [id number] Jolene Schimke MD WU:[id number]     Diabetic Foot Exam:     PHQ2/9:    02/05/2024    1:58 PM 10/09/2023    2:01 PM 08/30/2023    1:18 PM 06/04/2023    3:44 PM 04/11/2023   11:32 AM  Depression screen PHQ 2/9  Decreased Interest 0 0 0 0 0  Down, Depressed, Hopeless 0 0 0 0 0  PHQ - 2 Score 0 0 0 0 0  Altered sleeping 0 0  0 0  Tired, decreased energy 0 0  0 0  Change in appetite 0 0  0 0  Feeling bad or failure about yourself  0 0  0 0  Trouble  concentrating 0 0  0 0  Moving slowly or fidgety/restless 0 0  0 0  Suicidal thoughts 0 0  0 0  PHQ-9 Score 0 0  0 0  Difficult doing work/chores Not difficult at all   Not difficult at all Not difficult at all    phq 9 is negative  Fall Risk:    02/05/2024    1:58 PM 08/30/2023    1:07 PM 06/04/2023    3:44 PM 04/11/2023   11:32 AM 12/26/2022    2:06 PM  Fall Risk   Falls in the past year? 1 0 0 0 0  Number falls in past yr: 1 0 0 0 0  Injury with Fall? 0 0 0 0 0  Risk for fall due to : Impaired balance/gait No Fall Risks   No Fall Risks  Follow up Falls prevention discussed;Education provided;Falls evaluation completed Education provided;Falls prevention discussed   Falls prevention discussed     Assessment & Plan  1. Type 2 diabetes mellitus with stage 3a chronic kidney disease, without long-term current use of insulin (HCC) (Primary)  - POCT glycosylated hemoglobin (Hb A1C)  2. Mucopurulent chronic bronchitis (HCC)  - doxycycline (VIBRA-TABS) 100 MG tablet; Take 1 tablet (100 mg total) by mouth 2 (two) times daily.  Dispense: 20 tablet; Refill: 0 - Budeson-Glycopyrrol-Formoterol (BREZTRI AEROSPHERE) 160-9-4.8 MCG/ACT AERO; Inhale 2 puffs into the lungs in the morning and at bedtime.  Dispense: 10.7 g; Refill: 2  3. Atherosclerosis of aorta (HCC)  On statin therapy   4. Stage 3a chronic kidney disease (HCC)  Recheck level next visit, 59 on recent labs  5. Rectal cancer [facility])  Under the care of Dr. Cathie Hoops  6. Bipolar affective disorder, currently depressed, moderate (HCC)  Seeing psychiatrist   7. Metastasis to liver [facility])  Up to date with visits with oncologist   8. Moderate protein-calorie malnutrition (HCC)  Must add more protein to her diet  9. OSA (obstructive sleep apnea)  Needs to see pulmonologist   10. Hypothyroidism due to medication  Recheck level yearly   11. Acute non-recurrent maxillary sinusitis  - doxycycline (VIBRA-TABS) 100 MG tablet;  Take 1 tablet (100 mg total) by mouth 2 (two) times daily.  Dispense: 20 tablet; Refill: 0

## 2024-02-05 NOTE — Patient Instructions (Signed)
[facility] at [facility] [address] [place],  [place]  [address]  Main: [phone]

## 2024-02-07 ENCOUNTER — Telehealth: Payer: Self-pay | Admitting: *Deleted

## 2024-02-07 NOTE — Telephone Encounter (Signed)
The patient called stating that she has a sinus infection  and her lymph nodules are swollen. She got atb from PCP and she has taken starting at 2/11. She  wants to know if Dr/ Cathie Hoops has any insight as to if she will be able to have her appt 2/17 for labs, see md and treatment. She also wants a call back.

## 2024-02-08 ENCOUNTER — Other Ambulatory Visit: Payer: Self-pay | Admitting: Oncology

## 2024-02-08 DIAGNOSIS — C20 Malignant neoplasm of rectum: Secondary | ICD-10-CM

## 2024-02-08 NOTE — Telephone Encounter (Signed)
Morrie Sheldon, please contact pt with new appt details.   Rushie Nyhan for transportation purposes

## 2024-02-11 ENCOUNTER — Inpatient Hospital Stay: Payer: 59 | Admitting: Oncology

## 2024-02-11 ENCOUNTER — Inpatient Hospital Stay: Payer: 59

## 2024-02-18 ENCOUNTER — Inpatient Hospital Stay: Payer: 59

## 2024-02-18 ENCOUNTER — Ambulatory Visit: Admission: RE | Admit: 2024-02-18 | Payer: 59 | Source: Ambulatory Visit

## 2024-02-18 ENCOUNTER — Encounter: Payer: Self-pay | Admitting: Oncology

## 2024-02-18 ENCOUNTER — Inpatient Hospital Stay (HOSPITAL_BASED_OUTPATIENT_CLINIC_OR_DEPARTMENT_OTHER): Payer: 59 | Admitting: Oncology

## 2024-02-18 VITALS — BP 134/85 | HR 92 | Resp 18

## 2024-02-18 VITALS — BP 120/81 | HR 107 | Temp 96.0°F | Resp 18 | Wt 131.1 lb

## 2024-02-18 DIAGNOSIS — Z7984 Long term (current) use of oral hypoglycemic drugs: Secondary | ICD-10-CM | POA: Diagnosis not present

## 2024-02-18 DIAGNOSIS — G4730 Sleep apnea, unspecified: Secondary | ICD-10-CM | POA: Diagnosis not present

## 2024-02-18 DIAGNOSIS — G620 Drug-induced polyneuropathy: Secondary | ICD-10-CM | POA: Diagnosis not present

## 2024-02-18 DIAGNOSIS — C20 Malignant neoplasm of rectum: Secondary | ICD-10-CM

## 2024-02-18 DIAGNOSIS — Z5111 Encounter for antineoplastic chemotherapy: Secondary | ICD-10-CM | POA: Diagnosis not present

## 2024-02-18 DIAGNOSIS — J441 Chronic obstructive pulmonary disease with (acute) exacerbation: Secondary | ICD-10-CM | POA: Diagnosis not present

## 2024-02-18 DIAGNOSIS — T451X5A Adverse effect of antineoplastic and immunosuppressive drugs, initial encounter: Secondary | ICD-10-CM

## 2024-02-18 DIAGNOSIS — J449 Chronic obstructive pulmonary disease, unspecified: Secondary | ICD-10-CM | POA: Diagnosis not present

## 2024-02-18 DIAGNOSIS — I10 Essential (primary) hypertension: Secondary | ICD-10-CM | POA: Diagnosis not present

## 2024-02-18 DIAGNOSIS — D122 Benign neoplasm of ascending colon: Secondary | ICD-10-CM | POA: Diagnosis not present

## 2024-02-18 DIAGNOSIS — I700 Atherosclerosis of aorta: Secondary | ICD-10-CM | POA: Diagnosis not present

## 2024-02-18 DIAGNOSIS — N200 Calculus of kidney: Secondary | ICD-10-CM | POA: Diagnosis not present

## 2024-02-18 DIAGNOSIS — E785 Hyperlipidemia, unspecified: Secondary | ICD-10-CM | POA: Diagnosis not present

## 2024-02-18 DIAGNOSIS — K5900 Constipation, unspecified: Secondary | ICD-10-CM | POA: Diagnosis not present

## 2024-02-18 DIAGNOSIS — E1140 Type 2 diabetes mellitus with diabetic neuropathy, unspecified: Secondary | ICD-10-CM | POA: Diagnosis not present

## 2024-02-18 DIAGNOSIS — K219 Gastro-esophageal reflux disease without esophagitis: Secondary | ICD-10-CM | POA: Diagnosis not present

## 2024-02-18 DIAGNOSIS — F17210 Nicotine dependence, cigarettes, uncomplicated: Secondary | ICD-10-CM | POA: Diagnosis not present

## 2024-02-18 DIAGNOSIS — E039 Hypothyroidism, unspecified: Secondary | ICD-10-CM | POA: Diagnosis not present

## 2024-02-18 DIAGNOSIS — Z79899 Other long term (current) drug therapy: Secondary | ICD-10-CM | POA: Diagnosis not present

## 2024-02-18 DIAGNOSIS — J9601 Acute respiratory failure with hypoxia: Secondary | ICD-10-CM | POA: Diagnosis not present

## 2024-02-18 DIAGNOSIS — Z5986 Financial insecurity: Secondary | ICD-10-CM | POA: Diagnosis not present

## 2024-02-18 DIAGNOSIS — E1122 Type 2 diabetes mellitus with diabetic chronic kidney disease: Secondary | ICD-10-CM | POA: Diagnosis not present

## 2024-02-18 LAB — CMP (CANCER CENTER ONLY)
ALT: 22 U/L (ref 0–44)
AST: 26 U/L (ref 15–41)
Albumin: 4.4 g/dL (ref 3.5–5.0)
Alkaline Phosphatase: 72 U/L (ref 38–126)
Anion gap: 12 (ref 5–15)
BUN: 18 mg/dL (ref 6–20)
CO2: 24 mmol/L (ref 22–32)
Calcium: 9.2 mg/dL (ref 8.9–10.3)
Chloride: 100 mmol/L (ref 98–111)
Creatinine: 1.1 mg/dL — ABNORMAL HIGH (ref 0.44–1.00)
GFR, Estimated: >60 mL/min (ref >60)
Glucose, Bld: 151 mg/dL — ABNORMAL HIGH (ref 70–99)
Potassium: 4 mmol/L (ref 3.5–5.1)
Sodium: 136 mmol/L (ref 135–145)
Total Bilirubin: 0.4 mg/dL (ref 0.0–1.2)
Total Protein: 7.6 g/dL (ref 6.5–8.1)

## 2024-02-18 LAB — CBC WITH DIFFERENTIAL (CANCER CENTER ONLY)
Abs Immature Granulocytes: 0.01 10*3/uL (ref 0.00–0.07)
Basophils Absolute: 0 10*3/uL (ref 0.0–0.1)
Basophils Relative: 1 %
Eosinophils Absolute: 0.1 10*3/uL (ref 0.0–0.5)
Eosinophils Relative: 2 %
HCT: 48.3 % — ABNORMAL HIGH (ref 36.0–46.0)
Hemoglobin: 16.2 g/dL — ABNORMAL HIGH (ref 12.0–15.0)
Immature Granulocytes: 0 %
Lymphocytes Relative: 25 %
Lymphs Abs: 1.1 10*3/uL (ref 0.7–4.0)
MCH: 33.1 pg (ref 26.0–34.0)
MCHC: 33.5 g/dL (ref 30.0–36.0)
MCV: 98.6 fL (ref 80.0–100.0)
Monocytes Absolute: 0.7 10*3/uL (ref 0.1–1.0)
Monocytes Relative: 17 %
Neutro Abs: 2.3 10*3/uL (ref 1.7–7.7)
Neutrophils Relative %: 55 %
Platelet Count: 150 10*3/uL (ref 150–400)
RBC: 4.9 MIL/uL (ref 3.87–5.11)
RDW: 17.2 % — ABNORMAL HIGH (ref 11.5–15.5)
WBC Count: 4.2 10*3/uL (ref 4.0–10.5)
nRBC: 0 % (ref 0.0–0.2)

## 2024-02-18 LAB — PROTEIN, URINE, RANDOM: Total Protein, Urine: 65 mg/dL

## 2024-02-18 MED ORDER — SODIUM CHLORIDE 0.9 % IV SOLN
Freq: Once | INTRAVENOUS | Status: AC
  Filled 2024-02-18: qty 250

## 2024-02-18 MED ORDER — PALONOSETRON HCL INJECTION 0.25 MG/5ML
0.2500 mg | Freq: Once | INTRAVENOUS | Status: AC
  Administered 2024-02-18: 0.25 mg via INTRAVENOUS
  Filled 2024-02-18: qty 5

## 2024-02-18 MED ORDER — FLUOROURACIL CHEMO INJECTION 2.5 GM/50ML
400.0000 mg/m2 | Freq: Once | INTRAVENOUS | Status: AC
Start: 2024-02-18 — End: 2024-02-18
  Administered 2024-02-18: 650 mg via INTRAVENOUS
  Filled 2024-02-18: qty 13

## 2024-02-18 MED ORDER — SODIUM CHLORIDE 0.9 % IV SOLN
180.0000 mg/m2 | Freq: Once | INTRAVENOUS | Status: AC
  Administered 2024-02-18: 300 mg via INTRAVENOUS
  Filled 2024-02-18: qty 15

## 2024-02-18 MED ORDER — DEXAMETHASONE SODIUM PHOSPHATE 10 MG/ML IJ SOLN
10.0000 mg | Freq: Once | INTRAMUSCULAR | Status: AC
  Administered 2024-02-18: 10 mg via INTRAVENOUS
  Filled 2024-02-18: qty 1

## 2024-02-18 MED ORDER — SODIUM CHLORIDE 0.9 % IV SOLN
5.0000 mg/kg | Freq: Once | INTRAVENOUS | Status: AC
  Administered 2024-02-18: 300 mg via INTRAVENOUS
  Filled 2024-02-18: qty 12

## 2024-02-18 MED ORDER — SODIUM CHLORIDE 0.9 % IV SOLN
400.0000 mg/m2 | Freq: Once | INTRAVENOUS | Status: AC
  Administered 2024-02-18: 644 mg via INTRAVENOUS
  Filled 2024-02-18: qty 32.2

## 2024-02-18 MED ORDER — SODIUM CHLORIDE 0.9 % IV SOLN
2400.0000 mg/m2 | INTRAVENOUS | Status: AC
  Administered 2024-02-18: 3850 mg via INTRAVENOUS
  Filled 2024-02-18: qty 77

## 2024-02-18 NOTE — Assessment & Plan Note (Signed)
 Chemotherapy plan as listed above

## 2024-02-18 NOTE — Patient Instructions (Signed)
 [facility] - A DEPT OF [facility]  Discharge Instructions: Thank you for choosing [facility] to provide your oncology and hematology care.  If you have a lab appointment with the Cancer Center, please go directly to the Cancer Center and check in at the registration area.  Wear comfortable clothing and clothing appropriate for easy access to any Portacath or PICC line.   We strive to give you quality time with your provider. You may need to reschedule your appointment if you arrive late (15 or more minutes).  Arriving late affects you and other patients whose appointments are after yours.  Also, if you miss three or more appointments without notifying the office, you may be dismissed from the clinic at the provider's discretion.      For prescription refill requests, have your pharmacy contact our office and allow 72 hours for refills to be completed.    Today you received the following chemotherapy and/or immunotherapy agents Irinotecan, Leucovorin, Mvasi, Adrucil       To help prevent nausea and vomiting after your treatment, we encourage you to take your nausea medication as directed.  BELOW ARE SYMPTOMS THAT SHOULD BE REPORTED IMMEDIATELY: *FEVER GREATER THAN 100.4 F (38 C) OR HIGHER *CHILLS OR SWEATING *NAUSEA AND VOMITING THAT IS NOT CONTROLLED WITH YOUR NAUSEA MEDICATION *UNUSUAL SHORTNESS OF BREATH *UNUSUAL BRUISING OR BLEEDING *URINARY PROBLEMS (pain or burning when urinating, or frequent urination) *BOWEL PROBLEMS (unusual diarrhea, constipation, pain near the anus) TENDERNESS IN MOUTH AND THROAT WITH OR WITHOUT PRESENCE OF ULCERS (sore throat, sores in mouth, or a toothache) UNUSUAL RASH, SWELLING OR PAIN  UNUSUAL VAGINAL DISCHARGE OR ITCHING   Items with * indicate a potential emergency and should be followed up as soon as possible or go to the Emergency Department if any problems should occur.  Please show the CHEMOTHERAPY  ALERT CARD or IMMUNOTHERAPY ALERT CARD at check-in to the Emergency Department and triage nurse.  Should you have questions after your visit or need to cancel or reschedule your appointment, please contact [facility] - A DEPT OF [facility]  [phone] and follow the prompts.  Office hours are 8:00 a.m. to 4:30 p.m. Monday - Friday. Please note that voicemails left after 4:00 p.m. may not be returned until the following business day.  We are closed weekends and major holidays. You have access to a nurse at all times for urgent questions. Please call the main number to the clinic [phone] and follow the prompts.  For any non-urgent questions, you may also contact your provider using MyChart. We now offer e-Visits for anyone 58 and older to request care online for non-urgent symptoms. For details visit [email or web address].   Also download the MyChart app! Go to the app store, search "MyChart", open the app, select [facility], and log in with your MyChart username and password.

## 2024-02-18 NOTE — Assessment & Plan Note (Signed)
 Grade 2  Continue gabapentin 300mg  BID.

## 2024-02-18 NOTE — Assessment & Plan Note (Addendum)
 History of Stage III. pT3 pN1b cM0, s/p APR. 05/2023 Stage IV current rectal adenocarcinoma Currently on adjuvant chemotherapy. S/p FOLFOX x 4 cycles S/p concurrent chemotherapy [Xeloda 1300 mg BID] with RT, and then another 4 cycles of adjuvant FOLFOX [dose reduced oxaliplatin and omit 5-FU bolus 05/2023 CT imaging indicates disease progression. liver biopsy pathology showed metastatic carcinoma--> FOLFIRI + Bevacizumab   [facility] NGS showed TP53 missense variant, APC frameshift, TCF7L2 Frameshift, FLT3 copy number gain., TMB 4.7, pMMR Wildtype KRAS/NRAS Labs are reviewed and discussed with patient. Proceed with FOLFIRI + Bevacizumab  I will give her 1L NS today as hydration today

## 2024-02-18 NOTE — Progress Notes (Signed)
 Hematology/Oncology Progress note [phone] [phone]      CHIEF COMPLAINTS/REASON FOR VISIT:  Follow-up for Stage IV  rectal cancer treatments.  ASSESSMENT & PLAN:   Cancer Staging  Rectal cancer [facility]) Staging form: Colon and Rectum, AJCC 8th Edition - Pathologic stage from 05/03/2022: Stage IIIB (pT3, pN1b, cM0) - Signed by [name], MD on 05/03/2022 - Pathologic stage from 06/18/2023: Stage IVA (rpTX, pN0, pM1a) - Signed by [name], MD on 06/18/2023   Rectal cancer [facility]) History of Stage III. pT3 pN1b cM0, s/p APR. 05/2023 Stage IV current rectal adenocarcinoma Currently on adjuvant chemotherapy. S/p FOLFOX x 4 cycles S/p concurrent chemotherapy [Xeloda 1300 mg BID] with RT, and then another 4 cycles of adjuvant FOLFOX [dose reduced oxaliplatin and omit 5-FU bolus 05/2023 CT imaging indicates disease progression. liver biopsy pathology showed metastatic carcinoma--> FOLFIRI + Bevacizumab   [employer] NGS showed TP53 missense variant, APC frameshift, TCF7L2 Frameshift, FLT3 copy number gain., TMB 4.7, pMMR Wildtype KRAS/NRAS Labs are reviewed and discussed with patient. Proceed with FOLFIRI + Bevacizumab  I will give her 1L NS today as hydration today     Chemotherapy-induced neuropathy (HCC) Grade 2  Continue gabapentin 300mg  BID.   Encounter for antineoplastic chemotherapy Chemotherapy plan as listed above  No orders of the defined types were placed in this encounter.   Follow-up 2 week(s)  All questions were answered. The patient knows to call the clinic with any problems, questions or concerns.  [name], MD, PhD [facility] Hematology Oncology 02/18/2024      HISTORY OF PRESENTING ILLNESS:   April Luna is a  53 y.o.  female presents for treatment of rectal cancer Oncology History  Rectal cancer (HCC)  02/26/2022 Imaging   MRI PELVIS WITHOUT CONTRAST- By imaging, rectal cancer stage:  T1/T2, N0, Mx    03/02/2022 Imaging   CT  CHEST AND ABDOMEN WITH CONTRAST 1. No convincing evidence of metastatic disease within the chest or abdomen. 2. Atrophic left kidney with multifocal renal scarring and cortical calcifications as well as nonobstructive renal stones measuring up to 5 mm. 3. Prominent left-sided predominant retroperitoneal lymph nodes measuring up to 8 mm near the level of the renal hilum, overall decreased in size dating back to CT September 19, 2018 and favored reactive related to left renal inflammation. 4.  Aortic Atherosclerosis (ICD10-I70.0).   03/27/2022 Genetic Testing    [facility] CustomNext+RNA cancer panel found no pathogenic mutations.    04/21/2022 Initial Diagnosis   Rectal cancer - baseline CEA 3.6 -02/09/2022, patient had colonoscopy which showed renal mass 10 cm from anal verge.  5 mm polyp in ascending colon.  Removed and retrieved. Pathology showed rectal adenocarcinoma.  The polyp in the ascending colon is a tubular adenoma.  MRI showed cT1/T2N0 disease  -04/21/2022, patient underwent robotic assisted ultralow anterior resection. Pathology showed moderately differentiated adenocarcinoma, 4.5 cm in maximal extent, with focal extension through muscularis propria into perirectal soft tissue.  3 lymph nodes positive for metastatic carcinoma.  Negative margin.  pT3 pN1b, MSI stable.   05/03/2022 Cancer Staging   Staging form: Colon and Rectum, AJCC 8th Edition - Pathologic stage from 05/03/2022: Stage IIIB (pT3, pN1b, cM0) - Signed by [name], MD on 05/03/2022 Stage prefix: Initial diagnosis   05/11/2022 Miscellaneous   Medi port placed by Dr.White   05/26/2022 -  Chemotherapy   FOLFOX Q2 weeks x 4   05/26/2022 - 07/09/2022 Chemotherapy   Patient is on Treatment Plan : COLORECTAL  FOLFOX q14d x 8 cycles     05/26/2022 - 12/13/2022 Chemotherapy   Patient is on Treatment Plan : COLORECTAL FOLFOX q14d x 4 months     07/27/2022 -  Chemotherapy   Concurrent chemotherapy- Xeloda  1300mg  BID and radiation.     07/27/2022 - 09/12/2022 Radiation Therapy    concurrent chemotherapy [Xeloda 1300 mg BID] with RT    09/20/2022 Imaging   CT Angiogram chest PET protocol There is no evidence of pulmonary artery embolism. There is no evidence of thoracic aortic dissection.   Small linear patchy alveolar infiltrate is seen in medial segment of right middle lobe suggesting atelectasis/pneumonia.     09/28/2022 - 09/30/2022 Hospital Admission   Admission due to acute respiratory failure with hypoxia, COPD exacerbation   09/28/2022 Imaging   CT angio chest PE protocol 1. Negative for acute PE or thoracic aortic dissection. 2. New ground-glass infiltrates anteriorly in bilateral upper lobes,may represent atypical edema, infectious or inflammatory process. 3.  Aortic Atherosclerosis     12/22/2022 Imaging   CT chest abdomen pelvis w contrast  Stable exam. No evidence of recurrent or metastatic carcinoma within the chest, abdomen, or pelvis.   Left nephrolithiasis and renal parenchymal atrophy. No evidence of ureteral calculi or hydronephrosis.   Aortic Atherosclerosis   05/31/2023 Imaging   CT chest abdomen pelvis with contrast showed 1. New hypodense 16 mm lesion in the right lobe of the liver with very subtle adjacent 5 mm lesion, suspicious for metastatic disease. 2. Small bilateral pulmonary nodules, some of which were subtly evident on prior examination only in retrospect, some of which demonstrate degree of cavitation, suspicious for metastatic disease. 3. Prominent retroperitoneal lymph nodes are similar prior. 4. Prior low anterior resection with Hartmann's pouch formation,similar small amount of perirectal/presacral soft tissue and fluid,favored postsurgical/posttreatment change. Suggest continued attention on follow-up imaging. 5. Large volume of formed stool in the colon. 6. Nonobstructive left renal calculi measure under 1 cm.     06/08/2023 Relapse/Recurrence   Ultrasound-guided liver biopsy  showed Pathology showed metastatic carcinoma, morphology consistent with patient's clinical history of rectal adenocarcinoma.  [employer] NGS 648 gene panel showed TP53 missense variant, APC frameshift, TCF7L2 frameshift, FLT3 copy number gain, TMB 4.41m/MB MSI stable.  Wildtype KRAS/NRAS  [employer] RNA Seq - ERBB3 overexpressed, VEGFA overexpressed, NRAS overexpressed.     06/18/2023 Cancer Staging   Staging form: Colon and Rectum, AJCC 8th Edition - Pathologic stage from 06/18/2023: Stage IVA (rpTX, pN0, pM1a) - Signed by [name], MD on 06/18/2023 Stage prefix: Recurrence Total positive nodes: 0   06/25/2023 - 06/25/2023 Chemotherapy   Patient is on Treatment Plan : COLORECTAL FOLFOX + Bevacizumab q14d     07/02/2023 -  Chemotherapy   Patient is on Treatment Plan : COLORECTAL FOLFIRI + Bevacizumab q14d     11/13/2023 Imaging   CT chest abdomen pelvis w contrast showed  1. Increase in cavitation with decreased soft tissue involving the scattered bilateral pulmonary nodules, consistent with treatment response. No new suspicious pulmonary nodules or masses. 2. Decrease in size and conspicuity of the hypodense lesions in the right lobe of the liver, consistent with treatment response. No new suspicious hepatic lesion identified. 3. Stable prominent nonspecific retroperitoneal lymph nodes. Suggest continued attention on follow-up imaging. 4. Similar surgical changes of prior low anterior resection with left anterior abdominal wall colostomy. Similar mesorectal/presacral soft tissue, no new suspicious enhancing nodularity. 5. Similar soft tissue nodularity in the bilateral posterior gluteal subcutaneous soft tissues. 6.  Moderate volume of formed stool in the colon. Correlate for constipation.   Patient has bipolar and schizophrenia..  Patient is married  She has housing issue due to previous history of eviction.  she does not live with her husband currently. She lives with some family members.    + chronic dysuria and urgency she was seen by Urology and was started on [place].  Symptoms are improved.    INTERVAL HISTORY April Luna is a 53 y.o. female who has above history reviewed by me today presents for follow up visit for rectal cancer.   Denies any melena or blood in the stool. Stable neuropathy symptoms. She denies any diarrhea except one episode of loose stool yesterday.     Review of Systems  Constitutional:  Positive for fatigue. Negative for appetite change, chills and fever.  HENT:   Negative for hearing loss, mouth sores and voice change.   Eyes:  Negative for eye problems.  Respiratory:  Negative for chest tightness, cough and shortness of breath.   Cardiovascular:  Negative for chest pain.  Gastrointestinal:  Negative for abdominal distention, abdominal pain, blood in stool, diarrhea and nausea.       + colostomy  Endocrine: Negative for hot flashes.  Genitourinary:  Positive for dysuria and frequency. Negative for difficulty urinating.   Musculoskeletal:  Negative for arthralgias.  Skin:  Negative for itching and rash.  Neurological:  Positive for numbness. Negative for extremity weakness.  Hematological:  Negative for adenopathy.  Psychiatric/Behavioral:  Negative for confusion.     MEDICAL HISTORY:  Past Medical History:  Diagnosis Date   Allergy    pollen   Anxiety    Arthritis    right hip   Bipolar 1 disorder (HCC)    Cancer (HCC)    rectal   Chemotherapy-induced neuropathy (HCC) 10/24/2022   Chronic kidney disease    COPD (chronic obstructive pulmonary disease) (HCC)    Depression    Family history of adverse reaction to anesthesia    grand father had a stroke during anesthesia   Family history of breast cancer    Family history of colon cancer    Family history of uterine cancer    GERD (gastroesophageal reflux disease)    History of kidney stones    Hyperlipidemia    Hypertension    Hypothyroidism    Panic attack     Pneumonia    Psoriasis    Sleep apnea 08/11/2021   No CPAP   Type 2 diabetes mellitus with microalbuminuria, without long-term current use of insulin (HCC) 06/24/2019    SURGICAL HISTORY: Past Surgical History:  Procedure Laterality Date   BREAST BIOPSY Left 12/14/2021   [place] bx, venus marker, path pending   CESAREAN SECTION     COLONOSCOPY WITH PROPOFOL N/A 02/09/2022   Procedure: COLONOSCOPY WITH PROPOFOL;  Surgeon: Toney Reil, MD;  Location: [facility];  Service: Endoscopy;  Laterality: N/A;  sleep apnea   CYSTOSCOPY W/ RETROGRADES Left 11/08/2018   Procedure: CYSTOSCOPY WITH RETROGRADE PYELOGRAM;  Surgeon: Sondra Come, MD;  Location: [facility];  Service: Urology;  Laterality: Left;   CYSTOSCOPY/URETEROSCOPY/HOLMIUM LASER/STENT PLACEMENT Left 11/08/2018   Procedure: CYSTOSCOPY/URETEROSCOPY/HOLMIUM LASER/STENT PLACEMENT;  Surgeon: Sondra Come, MD;  Location: [facility];  Service: Urology;  Laterality: Left;   IR CV LINE INJECTION  06/25/2023   IR IMAGING GUIDED PORT INSERTION  05/11/2022   IR REMOVE CV FIBRIN SHEATH  06/27/2023   MOUTH SURGERY  wisdom teeth extraction   MOUTH SURGERY     teeth removal   POLYPECTOMY N/A 02/09/2022   Procedure: POLYPECTOMY;  Surgeon: Toney Reil, MD;  Location: [facility];  Service: Endoscopy;  Laterality: N/A;   XI ROBOTIC ASSISTED LOWER ANTERIOR RESECTION N/A 04/21/2022   Procedure: XI ROBOTIC ASSISTED LOWER ANTERIOR RESECTION WITH COLOSTOMY, BILATERAL TAP BLOCK, ASSESSMENT OF TISSUE PERFUSSION WITH FIREFLY INJECTION;  Surgeon: Andria Meuse, MD;  Location: [facility];  Service: General;  Laterality: N/A;    SOCIAL HISTORY: Social History   Socioeconomic History   Marital status: Married    Spouse name: Lesle Chris   Number of children: 1   Years of education: 12   Highest education level: High school graduate  Occupational History   Occupation: unemployed    Comment: disabled  Tobacco Use    Smoking status: Some Days    Current packs/day: 0.25    Average packs/day: 0.3 packs/day for 38.8 years (9.7 ttl pk-yrs)    Types: Cigarettes    Start date: 05/13/1985    Passive exposure: Current   Smokeless tobacco: Never   Tobacco comments:    5-6 cigarettes weekly- 11/07/2022  Vaping Use   Vaping status: Former   Start date: 05/25/2018   Quit date: 09/24/2018  Substance and Sexual Activity   Alcohol use: Not Currently    Alcohol/week: 4.0 standard drinks of alcohol    Types: 4 Glasses of wine per week    Comment: quit ETOH in Feb. 2023   Drug use: Yes    Types: Marijuana    Comment: 2 days ago   Sexual activity: Not Currently    Birth control/protection: Post-menopausal  Other Topics Concern   Not on file  Social History Narrative   Lives with husband and son    Social Drivers of Health   Financial Resource Strain: High Risk (08/30/2023)   Overall Financial Resource Strain (CARDIA)    Difficulty of Paying Living Expenses: Hard  Food Insecurity: Food Insecurity Present (08/30/2023)   Hunger Vital Sign    Worried About Running Out of Food in the Last Year: Sometimes true    Ran Out of Food in the Last Year: Sometimes true  Transportation Needs: No Transportation Needs (08/30/2023)   PRAPARE - Administrator, Civil Service (Medical): No    Lack of Transportation (Non-Medical): No  Physical Activity: Inactive (08/30/2023)   Exercise Vital Sign    Days of Exercise per Week: 0 days    Minutes of Exercise per Session: 0 min  Stress: Stress Concern Present (08/30/2023)   [employer] of Occupational Health - Occupational Stress Questionnaire    Feeling of Stress : Very much  Social Connections: Moderately Integrated (08/30/2023)   Social Connection and Isolation Panel [NHANES]    Frequency of Communication with Friends and Family: Never    Frequency of Social Gatherings with Friends and Family: Once a week    Attends Religious Services: More than 4 times per year     Active Member of [employer] or Organizations: No    Attends Banker Meetings: 1 to 4 times per year    Marital Status: Married  Catering manager Violence: Not At Risk (08/30/2023)   Humiliation, Afraid, Rape, and Kick questionnaire    Fear of Current or Ex-Partner: No    Emotionally Abused: No    Physically Abused: No    Sexually Abused: No    FAMILY HISTORY: Family History  Problem Relation  Age of Onset   Depression Mother    Anxiety disorder Mother    Diabetes Mother    Hypertension Mother    Hyperlipidemia Mother    Cancer Mother    Uterine cancer Mother 66   Cervical cancer Mother 68   Colon cancer Father    Depression Brother    Anxiety disorder Brother    Cancer Maternal Aunt        unk types   Diabetes Mellitus II Maternal Grandmother    Hypercholesterolemia Maternal Grandmother    Breast cancer Maternal Grandmother    Cancer Paternal Grandmother    Diabetes Paternal Grandmother    Melanoma Paternal Grandmother    Stomach cancer Paternal Grandmother     ALLERGIES:  is allergic to metformin and related, nsaids, perphenazine, sulfa antibiotics, abilify [aripiprazole], and penicillins.  MEDICATIONS:  Current Outpatient Medications  Medication Sig Dispense Refill   acetaminophen (TYLENOL) 325 MG tablet Take 650 mg by mouth every 6 (six) hours as needed for headache (pain).     albuterol (VENTOLIN HFA) 108 (90 Base) MCG/ACT inhaler Inhale 2 puffs into the lungs every 6 (six) hours as needed for wheezing or shortness of breath. 1 each 5   amantadine (SYMMETREL) 100 MG capsule Take 100 mg by mouth 2 (two) times daily.     blood glucose meter kit and supplies Dispense based on patient and insurance preference. Use up to four times daily as directed. (FOR ICD-10 E10.9, E11.9). 1 each 0   Budeson-Glycopyrrol-Formoterol (BREZTRI AEROSPHERE) 160-9-4.8 MCG/ACT AERO Inhale 2 puffs into the lungs in the morning and at bedtime. 10.7 g 2   buPROPion (WELLBUTRIN XL) 300 MG 24  hr tablet Take 300 mg by mouth daily.     Cholecalciferol (VITAMIN D-3) 125 MCG (5000 UT) TABS Take 5,000 Units by mouth daily. 30 tablet 1   clonazePAM (KLONOPIN) 0.5 MG tablet Take 1 mg by mouth 2 (two) times daily as needed.     dapagliflozin propanediol (FARXIGA) 10 MG TABS tablet TAKE 1 TABLET BY MOUTH DAILY BEFORE BREAKFAST 90 tablet 0   dexamethasone (DECADRON) 4 MG tablet Take 2 tablets (8 mg total) by mouth See admin instructions. Take 8mg  daily for 2 days after each chemotherapy. 30 tablet 1   diltiazem (CARDIZEM CD) 120 MG 24 hr capsule TAKE 1 CAPSULE BY MOUTH DAILY 90 capsule 1   docusate sodium (COLACE) 100 MG capsule TAKE 1 CAPSULE BY MOUTH TWICE DAILY 60 capsule 2   doxycycline (VIBRA-TABS) 100 MG tablet Take 1 tablet (100 mg total) by mouth 2 (two) times daily. 20 tablet 0   gabapentin (NEURONTIN) 300 MG capsule Take 300 mg by mouth 2 (two) times daily.     icosapent Ethyl (VASCEPA) 1 g capsule TAKE 2 CAPSULES BY MOUTH 2 TIMES DAILY 120 capsule 3   levothyroxine (SYNTHROID) 75 MCG tablet TAKE 1 TABLET BY MOUTH DAILY BEFORE BREAKFAST 90 tablet 2   lidocaine-prilocaine (EMLA) cream Apply to affected area once 30 g 3   loperamide (IMODIUM) 2 MG capsule Take 1 capsule (2 mg total) by mouth See admin instructions. Initial: 4 mg,the 2 mg every 2 hours (4 mg every 4 hours at night)  maximum: 16 mg/day 60 capsule 2   loratadine (CLARITIN) 10 MG tablet Take 10 mg by mouth every morning.     magic mouthwash w/lidocaine SOLN Take 5 mLs by mouth 4 (four) times daily as needed for mouth pain. Sig: Swish/Swallow 5-10 ml four times a day as needed. Dispense  480 ml. 1RF 480 mL 1   ondansetron (ZOFRAN) 8 MG tablet Take 1 tablet (8 mg total) by mouth every 8 (eight) hours as needed for nausea, vomiting or refractory nausea / vomiting. Start on the third day after chemotherapy. 30 tablet 1   paliperidone (INVEGA SUSTENNA) 234 MG/1.5ML SUSY injection Inject 234 mg into the muscle every 30 (thirty) days.  On or about the 14th of each month     pantoprazole (PROTONIX) 40 MG tablet TAKE 1 TABLET BY MOUTH DAILY 90 tablet 2   pioglitazone (ACTOS) 15 MG tablet Take 1 tablet (15 mg total) by mouth daily. 90 tablet 1   promethazine (PHENERGAN) 12.5 MG tablet Take 1 tablet (12.5 mg total) by mouth every 6 (six) hours as needed for nausea or vomiting. 30 tablet 0   rosuvastatin (CRESTOR) 40 MG tablet TAKE 1 TABLET BY MOUTH EVERY MORNING 90 tablet 0   sertraline (ZOLOFT) 100 MG tablet Take 200 mg by mouth every morning.     Spacer/Aero-Holding Chambers (AEROCHAMBER MV) inhaler Use as instructed 1 each 0   Vibegron (GEMTESA) 75 MG TABS Take 1 tablet (75 mg total) by mouth daily.     No current facility-administered medications for this visit.   Facility-Administered Medications Ordered in Other Visits  Medication Dose Route Frequency Provider Last Rate Last Admin   albuterol (PROVENTIL) (2.5 MG/3ML) 0.083% nebulizer solution 2.5 mg  2.5 mg Nebulization Once Raechel Chute, MD       fluorouracil (ADRUCIL) 3,850 mg in sodium chloride 0.9 % 73 mL chemo infusion  2,400 mg/m2 (Treatment Plan Recorded) Intravenous 1 day or 1 dose [name], MD   Infusion Verify at 02/18/24 1357   sodium chloride flush (NS) 0.9 % injection 10 mL  10 mL Intracatheter PRN [name], MD   10 mL at 08/15/23 1327     PHYSICAL EXAMINATION: ECOG PERFORMANCE STATUS: 0 - Asymptomatic Vitals:   02/18/24 0949  BP: 120/81  Pulse: (!) 107  Resp: 18  Temp: (!) 96 F (35.6 C)  SpO2: 98%    Filed Weights   02/18/24 0949  Weight: 131 lb 1.6 oz (59.5 kg)     Physical Exam Constitutional:      General: She is not in acute distress. HENT:     Head: Normocephalic and atraumatic.     Mouth/Throat:     Comments: mucositis Eyes:     General: No scleral icterus. Cardiovascular:     Rate and Rhythm: Normal rate and regular rhythm.  Pulmonary:     Effort: Pulmonary effort is normal. No respiratory distress.     Breath sounds: No  wheezing.     Comments: Decreased breath sound bilaterally Abdominal:     General: Bowel sounds are normal. There is no distension.     Palpations: Abdomen is soft.     Comments: +colostomy   Musculoskeletal:        General: No deformity. Normal range of motion.     Cervical back: Normal range of motion and neck supple.  Skin:    General: Skin is warm and dry.     Findings: No erythema or rash.  Neurological:     Mental Status: She is alert and oriented to person, place, and time. Mental status is at baseline.     Cranial Nerves: No cranial nerve deficit.     Coordination: Coordination normal.  Psychiatric:        Mood and Affect: Mood normal.     LABORATORY DATA:  I have reviewed the data as listed    Latest Ref Rng & Units 02/18/2024    9:39 AM 01/28/2024    9:01 AM 01/21/2024    8:43 AM  CBC  WBC 4.0 - 10.5 K/uL 4.2  7.8  5.0   Hemoglobin 12.0 - 15.0 g/dL 16.1  09.6  04.5   Hematocrit 36.0 - 46.0 % 48.3  41.1  43.3   Platelets 150 - 400 K/uL 150  139  120       Latest Ref Rng & Units 02/18/2024    9:39 AM 01/28/2024    9:01 AM 01/21/2024    8:43 AM  CMP  Glucose 70 - 99 mg/dL 409  811  914   BUN 6 - 20 mg/dL 18  17  14    Creatinine 0.44 - 1.00 mg/dL 7.82  9.56  2.13   Sodium 135 - 145 mmol/L 136  133  140   Potassium 3.5 - 5.1 mmol/L 4.0  3.4  3.4   Chloride 98 - 111 mmol/L 100  101  105   CO2 22 - 32 mmol/L 24  25  25    Calcium 8.9 - 10.3 mg/dL 9.2  8.9  9.2   Total Protein 6.5 - 8.1 g/dL 7.6  6.9  6.6   Total Bilirubin 0.0 - 1.2 mg/dL 0.4  0.3  0.4   Alkaline Phos 38 - 126 U/L 72  74  75   AST 15 - 41 U/L 26  21  24    ALT 0 - 44 U/L 22  19  16          RADIOGRAPHIC STUDIES: I have personally reviewed the radiological images as listed and agreed with the findings in the report. No results found.

## 2024-02-19 LAB — CEA: CEA: 4.9 ng/mL — ABNORMAL HIGH (ref 0.0–4.7)

## 2024-02-20 ENCOUNTER — Inpatient Hospital Stay: Payer: 59

## 2024-02-20 VITALS — BP 130/86 | HR 96 | Resp 20

## 2024-02-20 DIAGNOSIS — K219 Gastro-esophageal reflux disease without esophagitis: Secondary | ICD-10-CM | POA: Diagnosis not present

## 2024-02-20 DIAGNOSIS — E1122 Type 2 diabetes mellitus with diabetic chronic kidney disease: Secondary | ICD-10-CM | POA: Diagnosis not present

## 2024-02-20 DIAGNOSIS — N200 Calculus of kidney: Secondary | ICD-10-CM | POA: Diagnosis not present

## 2024-02-20 DIAGNOSIS — Z5986 Financial insecurity: Secondary | ICD-10-CM | POA: Diagnosis not present

## 2024-02-20 DIAGNOSIS — C20 Malignant neoplasm of rectum: Secondary | ICD-10-CM | POA: Diagnosis not present

## 2024-02-20 DIAGNOSIS — J449 Chronic obstructive pulmonary disease, unspecified: Secondary | ICD-10-CM | POA: Diagnosis not present

## 2024-02-20 DIAGNOSIS — D122 Benign neoplasm of ascending colon: Secondary | ICD-10-CM | POA: Diagnosis not present

## 2024-02-20 DIAGNOSIS — E785 Hyperlipidemia, unspecified: Secondary | ICD-10-CM | POA: Diagnosis not present

## 2024-02-20 DIAGNOSIS — Z79899 Other long term (current) drug therapy: Secondary | ICD-10-CM | POA: Diagnosis not present

## 2024-02-20 DIAGNOSIS — E1140 Type 2 diabetes mellitus with diabetic neuropathy, unspecified: Secondary | ICD-10-CM | POA: Diagnosis not present

## 2024-02-20 DIAGNOSIS — Z5111 Encounter for antineoplastic chemotherapy: Secondary | ICD-10-CM | POA: Diagnosis not present

## 2024-02-20 DIAGNOSIS — J9601 Acute respiratory failure with hypoxia: Secondary | ICD-10-CM | POA: Diagnosis not present

## 2024-02-20 DIAGNOSIS — I10 Essential (primary) hypertension: Secondary | ICD-10-CM | POA: Diagnosis not present

## 2024-02-20 DIAGNOSIS — Z7984 Long term (current) use of oral hypoglycemic drugs: Secondary | ICD-10-CM | POA: Diagnosis not present

## 2024-02-20 DIAGNOSIS — G4730 Sleep apnea, unspecified: Secondary | ICD-10-CM | POA: Diagnosis not present

## 2024-02-20 DIAGNOSIS — K5900 Constipation, unspecified: Secondary | ICD-10-CM | POA: Diagnosis not present

## 2024-02-20 DIAGNOSIS — I700 Atherosclerosis of aorta: Secondary | ICD-10-CM | POA: Diagnosis not present

## 2024-02-20 DIAGNOSIS — T451X5A Adverse effect of antineoplastic and immunosuppressive drugs, initial encounter: Secondary | ICD-10-CM | POA: Diagnosis not present

## 2024-02-20 DIAGNOSIS — J441 Chronic obstructive pulmonary disease with (acute) exacerbation: Secondary | ICD-10-CM | POA: Diagnosis not present

## 2024-02-20 DIAGNOSIS — F17210 Nicotine dependence, cigarettes, uncomplicated: Secondary | ICD-10-CM | POA: Diagnosis not present

## 2024-02-20 DIAGNOSIS — E039 Hypothyroidism, unspecified: Secondary | ICD-10-CM | POA: Diagnosis not present

## 2024-02-20 DIAGNOSIS — G620 Drug-induced polyneuropathy: Secondary | ICD-10-CM | POA: Diagnosis not present

## 2024-02-20 MED ORDER — HEPARIN SOD (PORK) LOCK FLUSH 100 UNIT/ML IV SOLN
500.0000 [IU] | Freq: Once | INTRAVENOUS | Status: AC | PRN
  Administered 2024-02-20: 500 [IU]
  Filled 2024-02-20: qty 5

## 2024-02-20 MED ORDER — SODIUM CHLORIDE 0.9% FLUSH
10.0000 mL | INTRAVENOUS | Status: DC | PRN
  Administered 2024-02-20: 10 mL
  Filled 2024-02-20: qty 10

## 2024-02-25 ENCOUNTER — Other Ambulatory Visit: Payer: 59

## 2024-02-25 ENCOUNTER — Ambulatory Visit: Payer: 59 | Admitting: Oncology

## 2024-02-25 ENCOUNTER — Ambulatory Visit: Payer: 59

## 2024-02-26 ENCOUNTER — Ambulatory Visit
Admission: RE | Admit: 2024-02-26 | Discharge: 2024-02-26 | Disposition: A | Discharge status: Home / Self Care | Payer: 59 | Source: Ambulatory Visit | Attending: Oncology | Admitting: Oncology

## 2024-02-26 ENCOUNTER — Ambulatory Visit: Payer: Self-pay | Admitting: Family Medicine

## 2024-02-26 ENCOUNTER — Inpatient Hospital Stay: Payer: 59 | Attending: Oncology

## 2024-02-26 DIAGNOSIS — R197 Diarrhea, unspecified: Secondary | ICD-10-CM | POA: Insufficient documentation

## 2024-02-26 DIAGNOSIS — K5900 Constipation, unspecified: Secondary | ICD-10-CM | POA: Insufficient documentation

## 2024-02-26 DIAGNOSIS — F17210 Nicotine dependence, cigarettes, uncomplicated: Secondary | ICD-10-CM | POA: Insufficient documentation

## 2024-02-26 DIAGNOSIS — J9601 Acute respiratory failure with hypoxia: Secondary | ICD-10-CM | POA: Insufficient documentation

## 2024-02-26 DIAGNOSIS — I700 Atherosclerosis of aorta: Secondary | ICD-10-CM | POA: Insufficient documentation

## 2024-02-26 DIAGNOSIS — N200 Calculus of kidney: Secondary | ICD-10-CM | POA: Insufficient documentation

## 2024-02-26 DIAGNOSIS — C20 Malignant neoplasm of rectum: Secondary | ICD-10-CM | POA: Diagnosis present

## 2024-02-26 DIAGNOSIS — D122 Benign neoplasm of ascending colon: Secondary | ICD-10-CM | POA: Insufficient documentation

## 2024-02-26 DIAGNOSIS — Z7984 Long term (current) use of oral hypoglycemic drugs: Secondary | ICD-10-CM | POA: Insufficient documentation

## 2024-02-26 DIAGNOSIS — Z9221 Personal history of antineoplastic chemotherapy: Secondary | ICD-10-CM | POA: Insufficient documentation

## 2024-02-26 DIAGNOSIS — J441 Chronic obstructive pulmonary disease with (acute) exacerbation: Secondary | ICD-10-CM | POA: Insufficient documentation

## 2024-02-26 DIAGNOSIS — F209 Schizophrenia, unspecified: Secondary | ICD-10-CM | POA: Insufficient documentation

## 2024-02-26 DIAGNOSIS — Z800 Family history of malignant neoplasm of digestive organs: Secondary | ICD-10-CM | POA: Insufficient documentation

## 2024-02-26 DIAGNOSIS — F319 Bipolar disorder, unspecified: Secondary | ICD-10-CM | POA: Insufficient documentation

## 2024-02-26 DIAGNOSIS — Z79899 Other long term (current) drug therapy: Secondary | ICD-10-CM | POA: Insufficient documentation

## 2024-02-26 DIAGNOSIS — D6481 Anemia due to antineoplastic chemotherapy: Secondary | ICD-10-CM | POA: Insufficient documentation

## 2024-02-26 DIAGNOSIS — E1122 Type 2 diabetes mellitus with diabetic chronic kidney disease: Secondary | ICD-10-CM | POA: Insufficient documentation

## 2024-02-26 DIAGNOSIS — Z803 Family history of malignant neoplasm of breast: Secondary | ICD-10-CM | POA: Insufficient documentation

## 2024-02-26 DIAGNOSIS — Z5111 Encounter for antineoplastic chemotherapy: Secondary | ICD-10-CM | POA: Insufficient documentation

## 2024-02-26 DIAGNOSIS — E1140 Type 2 diabetes mellitus with diabetic neuropathy, unspecified: Secondary | ICD-10-CM | POA: Insufficient documentation

## 2024-02-26 DIAGNOSIS — Z923 Personal history of irradiation: Secondary | ICD-10-CM | POA: Insufficient documentation

## 2024-02-26 DIAGNOSIS — Z79890 Hormone replacement therapy (postmenopausal): Secondary | ICD-10-CM | POA: Insufficient documentation

## 2024-02-26 MED ORDER — IOHEXOL 300 MG/ML  SOLN
30.0000 mL | Freq: Once | INTRAMUSCULAR | Status: AC | PRN
Start: 2024-02-26 — End: 2024-02-26
  Administered 2024-02-26: 30 mL via ORAL

## 2024-02-26 MED ORDER — IOHEXOL 300 MG/ML  SOLN
100.0000 mL | Freq: Once | INTRAMUSCULAR | Status: AC | PRN
  Administered 2024-02-26: 100 mL via INTRAVENOUS

## 2024-02-26 NOTE — Telephone Encounter (Signed)
  Chief Complaint: URI symptoms Symptoms: cough, head congestion Frequency: increased over the last couple of days Pertinent Negatives: Patient denies fever but endorses night sweats Disposition: [] ED /[] Urgent Care (no appt availability in office) / [x] Appointment(In office/virtual)/ []  [place] [facility]/ [] Home Care/ [] Refused Recommended Disposition /[] [place] [facility]/ []  Follow-up with PCP Additional Notes: patient with hx of COPD and cancer calling with concerns for URI symptoms-cough, head congestion. Patient states she was treated for infection on 2/11 but feels that the symptoms never went away fully. Per protocol, the recommendation is for patient to be seen within three days. Appointment made for 02/28/2024 with PCP at 11:20 am. Patient verbalized understanding of the plan and all questions answered.    Copied from CRM [id number]. Topic: Clinical - Medical Advice >> Feb 26, 2024 11:32 AM Fuller Mandril wrote: Reason for CRM: Patient called stated she was treated for infection on 2/11. She thinks it is back. Is having same symptoms. Would like to know what provider suggests or if she wants to call in medication. Thank You Reason for Disposition  Cough has been present for > 3 weeks  Answer Assessment - Initial Assessment Questions 1. ONSET: "When did the cough begin?"      Cough had been more noticeable the past few days 2. SEVERITY: "How bad is the cough today?"      Not too bad 3. SPUTUM: "Describe the color of your sputum" (none, dry cough; clear, white, yellow, green)     white 4. HEMOPTYSIS: "Are you coughing up any blood?" If so ask: "How much?" (flecks, streaks, tablespoons, etc.)     No 5. DIFFICULTY BREATHING: "Are you having difficulty breathing?" If Yes, ask: "How bad is it?" (e.g., mild, moderate, severe)    - MILD: No SOB at rest, mild SOB with walking, speaks normally in sentences, can lie down, no retractions, pulse < 100.    - MODERATE: SOB at rest, SOB with  minimal exertion and prefers to sit, cannot lie down flat, speaks in phrases, mild retractions, audible wheezing, pulse 100-120.    - SEVERE: Very SOB at rest, speaks in single words, struggling to breathe, sitting hunched forward, retractions, pulse > 120      No 6. FEVER: "Do you have a fever?" If Yes, ask: "What is your temperature, how was it measured, and when did it start?"     No but having night sweats 7. CARDIAC HISTORY: "Do you have any history of heart disease?" (e.g., heart attack, congestive heart failure)      No 8. LUNG HISTORY: "Do you have any history of lung disease?"  (e.g., pulmonary embolus, asthma, emphysema)     COPD 9. PE RISK FACTORS: "Do you have a history of blood clots?" (or: recent major surgery, recent prolonged travel, bedridden)     No 10. OTHER SYMPTOMS: "Do you have any other symptoms?" (e.g., runny nose, wheezing, chest pain)       Runny nose, some wheezing mainly at night  Protocols used: Cough - Acute Productive-A-AH

## 2024-02-28 ENCOUNTER — Encounter: Payer: Self-pay | Admitting: Family Medicine

## 2024-02-28 ENCOUNTER — Other Ambulatory Visit: Payer: Self-pay | Admitting: Family Medicine

## 2024-02-28 ENCOUNTER — Ambulatory Visit: Admitting: Family Medicine

## 2024-02-28 VITALS — BP 128/76 | HR 106 | Resp 18 | Ht 59.0 in | Wt 130.2 lb

## 2024-02-28 DIAGNOSIS — J411 Mucopurulent chronic bronchitis: Secondary | ICD-10-CM

## 2024-02-28 DIAGNOSIS — J069 Acute upper respiratory infection, unspecified: Secondary | ICD-10-CM

## 2024-02-28 DIAGNOSIS — R000 Tachycardia, unspecified: Secondary | ICD-10-CM

## 2024-02-28 LAB — POC COVID19/FLU A&B COMBO
Covid Antigen, POC: NEGATIVE
Influenza A Antigen, POC: NEGATIVE
Influenza B Antigen, POC: NEGATIVE

## 2024-02-28 NOTE — Progress Notes (Signed)
 Name: LANNAH KOIKE   MRN: [id number]    DOB: 1971-10-25   Date:02/28/2024       Progress Note  Subjective  Chief Complaint  Chief Complaint  Patient presents with   URI    treated for infection on 2/11 but feels that the symptoms never went away fully  Still has chest congestion and some wheezing    HPI   URI: she states about one week ago she noticed frontal headache and nasal congestion but two days ago had nose bleed from right side, she had some fever last night ,feeling more tired than usual, she has history of rectal cancer and bronchitis. She states cough is slightly worse and has some chest congestion but it has improved in the past 48 hours. She is here today to make sure it is nothing serious.   Tachycardia: it has been high in the past, did not take cold medications today, but had a coffee before she came in and was rushing, denies leg/calf pain   Patient Active Problem List   Diagnosis Date Noted   Dysuria 09/17/2023   Mucositis 08/13/2023   Tooth pain 08/13/2023   Skin rash 07/30/2023   Chemotherapy induced diarrhea 07/16/2023   Rectal bleeding 03/05/2023   Port-A-Cath in place 03/05/2023   Chemotherapy-induced neuropathy (HCC) 10/24/2022   Tachycardia 09/22/2022   Atherosclerosis of aorta (HCC) 07/17/2022   Stage 3a chronic kidney disease (HCC) 07/17/2022   Elevated serum creatinine 06/23/2022   Tobacco use 05/26/2022   Encounter for antineoplastic chemotherapy 05/17/2022   Goals of care, counseling/discussion 05/03/2022   Rectal cancer (HCC) 04/21/2022   Genetic testing 03/27/2022   Family history of colon cancer 03/14/2022   Family history of uterine cancer 03/14/2022   Family history of breast cancer 03/14/2022   Polyp of ascending colon    Obstructive sleep apnea syndrome 08/11/2021   Plantar callus 10/06/2019   Acquired hallux limitus of both feet 10/06/2019   Type 2 diabetes mellitus with diabetic microalbuminuria, with long-term current use of  insulin (HCC) 06/24/2019   COPD (chronic obstructive pulmonary disease) (HCC) 06/24/2019   Chronic constipation 06/24/2019   ASCUS of cervix with negative high risk HPV 09/05/2018   GERD without esophagitis 04/11/2018   Hyperlipidemia 04/11/2018   Essential hypertension 04/11/2018   Hypothyroidism 04/26/2015   Bipolar I disorder, most recent episode (or current) manic (HCC) 04/25/2015   Cannabis abuse 04/25/2015    Social History   Tobacco Use   Smoking status: Some Days    Current packs/day: 0.25    Average packs/day: 0.3 packs/day for 38.8 years (9.7 ttl pk-yrs)    Types: Cigarettes    Start date: 05/13/1985    Passive exposure: Current   Smokeless tobacco: Never   Tobacco comments:    5-6 cigarettes weekly- 11/07/2022  Substance Use Topics   Alcohol use: Not Currently    Alcohol/week: 4.0 standard drinks of alcohol    Types: 4 Glasses of wine per week    Comment: quit ETOH in Feb. 2023     Current Outpatient Medications:    acetaminophen (TYLENOL) 325 MG tablet, Take 650 mg by mouth every 6 (six) hours as needed for headache (pain)., Disp: , Rfl:    albuterol (VENTOLIN HFA) 108 (90 Base) MCG/ACT inhaler, Inhale 2 puffs into the lungs every 6 (six) hours as needed for wheezing or shortness of breath., Disp: 1 each, Rfl: 5   amantadine (SYMMETREL) 100 MG capsule, Take 100 mg by mouth 2 (two) times  daily., Disp: , Rfl:    blood glucose meter kit and supplies, Dispense based on patient and insurance preference. Use up to four times daily as directed. (FOR ICD-10 E10.9, E11.9)., Disp: 1 each, Rfl: 0   Budeson-Glycopyrrol-Formoterol (BREZTRI AEROSPHERE) 160-9-4.8 MCG/ACT AERO, Inhale 2 puffs into the lungs in the morning and at bedtime., Disp: 10.7 g, Rfl: 2   buPROPion (WELLBUTRIN XL) 300 MG 24 hr tablet, Take 300 mg by mouth daily., Disp: , Rfl:    Cholecalciferol (VITAMIN D-3) 125 MCG (5000 UT) TABS, Take 5,000 Units by mouth daily., Disp: 30 tablet, Rfl: 1   clonazePAM  (KLONOPIN) 0.5 MG tablet, Take 1 mg by mouth 2 (two) times daily as needed., Disp: , Rfl:    dapagliflozin propanediol (FARXIGA) 10 MG TABS tablet, TAKE 1 TABLET BY MOUTH DAILY BEFORE BREAKFAST, Disp: 90 tablet, Rfl: 0   dexamethasone (DECADRON) 4 MG tablet, Take 2 tablets (8 mg total) by mouth See admin instructions. Take 8mg  daily for 2 days after each chemotherapy., Disp: 30 tablet, Rfl: 1   diltiazem (CARDIZEM CD) 120 MG 24 hr capsule, TAKE 1 CAPSULE BY MOUTH DAILY, Disp: 90 capsule, Rfl: 1   docusate sodium (COLACE) 100 MG capsule, TAKE 1 CAPSULE BY MOUTH TWICE DAILY, Disp: 60 capsule, Rfl: 2   gabapentin (NEURONTIN) 300 MG capsule, Take 300 mg by mouth 2 (two) times daily., Disp: , Rfl:    icosapent Ethyl (VASCEPA) 1 g capsule, TAKE 2 CAPSULES BY MOUTH 2 TIMES DAILY, Disp: 120 capsule, Rfl: 3   levothyroxine (SYNTHROID) 75 MCG tablet, TAKE 1 TABLET BY MOUTH DAILY BEFORE BREAKFAST, Disp: 90 tablet, Rfl: 2   lidocaine-prilocaine (EMLA) cream, Apply to affected area once, Disp: 30 g, Rfl: 3   loperamide (IMODIUM) 2 MG capsule, Take 1 capsule (2 mg total) by mouth See admin instructions. Initial: 4 mg,the 2 mg every 2 hours (4 mg every 4 hours at night)  maximum: 16 mg/day, Disp: 60 capsule, Rfl: 2   loratadine (CLARITIN) 10 MG tablet, Take 10 mg by mouth every morning., Disp: , Rfl:    magic mouthwash w/lidocaine SOLN, Take 5 mLs by mouth 4 (four) times daily as needed for mouth pain. Sig: Swish/Swallow 5-10 ml four times a day as needed. Dispense 480 ml. 1RF, Disp: 480 mL, Rfl: 1   ondansetron (ZOFRAN) 8 MG tablet, Take 1 tablet (8 mg total) by mouth every 8 (eight) hours as needed for nausea, vomiting or refractory nausea / vomiting. Start on the third day after chemotherapy., Disp: 30 tablet, Rfl: 1   paliperidone (INVEGA SUSTENNA) 234 MG/1.5ML SUSY injection, Inject 234 mg into the muscle every 30 (thirty) days. On or about the 14th of each month, Disp: , Rfl:    pantoprazole (PROTONIX) 40 MG  tablet, TAKE 1 TABLET BY MOUTH DAILY, Disp: 90 tablet, Rfl: 2   pioglitazone (ACTOS) 15 MG tablet, Take 1 tablet (15 mg total) by mouth daily., Disp: 90 tablet, Rfl: 1   promethazine (PHENERGAN) 12.5 MG tablet, Take 1 tablet (12.5 mg total) by mouth every 6 (six) hours as needed for nausea or vomiting., Disp: 30 tablet, Rfl: 0   rosuvastatin (CRESTOR) 40 MG tablet, TAKE 1 TABLET BY MOUTH EVERY MORNING, Disp: 90 tablet, Rfl: 0   sertraline (ZOLOFT) 100 MG tablet, Take 200 mg by mouth every morning., Disp: , Rfl:    Spacer/Aero-Holding Chambers (AEROCHAMBER MV) inhaler, Use as instructed, Disp: 1 each, Rfl: 0   Vibegron (GEMTESA) 75 MG TABS, Take 1 tablet (  75 mg total) by mouth daily., Disp: , Rfl:  No current facility-administered medications for this visit.  Facility-Administered Medications Ordered in Other Visits:    albuterol (PROVENTIL) (2.5 MG/3ML) 0.083% nebulizer solution 2.5 mg, 2.5 mg, Nebulization, Once, Dgayli, Khabib, MD   sodium chloride flush (NS) 0.9 % injection 10 mL, 10 mL, Intracatheter, PRN, [name], MD, 10 mL at 08/15/23 1327  Allergies  Allergen Reactions   Metformin And Related Nausea And Vomiting   Nsaids Other (See Comments)    Stage 3 CKD   Perphenazine Other (See Comments)    Tremors, muscle weakness, tongue swelling   Sulfa Antibiotics Other (See Comments)    GI distress    Abilify [Aripiprazole] Rash   Penicillins Rash    She has taken amoxicillin without problems    ROS  Ten systems reviewed and is negative except as mentioned in HPI    Objective  Vitals:   02/28/24 1108 02/28/24 1109 02/28/24 1133  BP: 128/76    Pulse: (!) 125 (!) 118 (!) 106  Resp: 18    SpO2: 94%  91%  Weight: 130 lb 3.2 oz (59.1 kg)    Height: 4\' 11"  (1.499 m)      Body mass index is 26.3 kg/m.    Physical Exam  Constitutional: Patient appears well-developed and well-nourished. Obese  No distress.  HEENT: head atraumatic, normocephalic, pupils equal and reactive to  light, ears normal TM, neck supple, throat within normal limits clear rhinorrhea Cardiovascular:  Tachycardia  regular rhythm and normal heart sounds.  No murmur heard. No BLE edema. Pulmonary/Chest: Effort normal, diffuse rhonchi No respiratory distress. Abdominal: Soft.  There is no tenderness. Psychiatric: Patient has a normal mood and affect. behavior is normal. Judgment and thought content normal.   Recent Results (from the past 2160 hours)  CMP (Cancer Center only)     Status: Abnormal   Collection Time: 12/03/23 10:34 AM  Result Value Ref Range   Sodium 135 135 - 145 mmol/L   Potassium 3.6 3.5 - 5.1 mmol/L   Chloride 101 98 - 111 mmol/L   CO2 25 22 - 32 mmol/L   Glucose, Bld 134 (H) 70 - 99 mg/dL    Comment: Glucose reference range applies only to samples taken after fasting for at least 8 hours.   BUN 19 6 - 20 mg/dL   Creatinine 6.21 3.08 - 1.00 mg/dL   Calcium 8.8 (L) 8.9 - 10.3 mg/dL   Total Protein 7.2 6.5 - 8.1 g/dL   Albumin 4.0 3.5 - 5.0 g/dL   AST 15 15 - 41 U/L   ALT 15 0 - 44 U/L   Alkaline Phosphatase 69 38 - 126 U/L   Total Bilirubin 0.5 <1.2 mg/dL   GFR, Estimated >65 >78 mL/min    Comment: (NOTE) Calculated using the CKD-EPI Creatinine Equation (2021)    Anion gap 9 5 - 15    Comment: Performed at [facility], [address] [place], [place] [address]  CBC with Differential (Cancer Center Only)     Status: Abnormal   Collection Time: 12/03/23 10:34 AM  Result Value Ref Range   WBC Count 10.2 4.0 - 10.5 K/uL   RBC 4.36 3.87 - 5.11 MIL/uL   Hemoglobin 14.3 12.0 - 15.0 g/dL   HCT 95.2 84.1 - 32.4 %   MCV 98.4 80.0 - 100.0 fL   MCH 32.8 26.0 - 34.0 pg   MCHC 33.3 30.0 - 36.0 g/dL   RDW 14.7  11.5 - 15.5 %   Platelet Count 132 (L) 150 - 400 K/uL   nRBC 0.0 0.0 - 0.2 %   Neutrophils Relative % 82 %   Neutro Abs 8.3 (H) 1.7 - 7.7 K/uL   Lymphocytes Relative 8 %   Lymphs Abs 0.9 0.7 - 4.0 K/uL   Monocytes Relative 9 %   Monocytes Absolute 0.9 0.1  - 1.0 K/uL   Eosinophils Relative 1 %   Eosinophils Absolute 0.1 0.0 - 0.5 K/uL   Basophils Relative 0 %   Basophils Absolute 0.0 0.0 - 0.1 K/uL   Immature Granulocytes 0 %   Abs Immature Granulocytes 0.04 0.00 - 0.07 K/uL    Comment: Performed at [facility], [address] [place], [place] [address]  CEA     Status: None   Collection Time: 12/03/23 10:34 AM  Result Value Ref Range   CEA 4.7 0.0 - 4.7 ng/mL    Comment: (NOTE)                             Nonsmokers          <3.9                             Smokers             <5.6 [employer] Electrochemiluminescence Immunoassay (ECLIA) Values obtained with different assay methods or kits cannot be used interchangeably.  Results cannot be interpreted as absolute evidence of the presence or absence of malignant disease. Performed At: [facility] [address] [place], [place] [id number] Jolene Schimke MD [id number]   Protein, urine, random     Status: None   Collection Time: 12/03/23 10:35 AM  Result Value Ref Range   Total Protein, Urine 7 mg/dL    Comment: NO NORMAL RANGE ESTABLISHED FOR THIS TEST Performed at [facility], [address] [place], [place] [address]   Urinalysis, Complete     Status: Abnormal   Collection Time: 12/11/23  1:59 PM  Result Value Ref Range   Specific Gravity, UA <1.005 (L) 1.005 - 1.030   pH, UA 6.0 5.0 - 7.5   Color, UA Yellow Yellow   Appearance Ur Clear Clear   Leukocytes,UA Trace (A) Negative   Protein,UA Negative Negative/Trace   Glucose, UA Trace (A) Negative   Ketones, UA Negative Negative   RBC, UA Negative Negative   Bilirubin, UA Negative Negative   Urobilinogen, Ur 0.2 0.2 - 1.0 mg/dL   Nitrite, UA Negative Negative   Microscopic Examination See below:   Microscopic Examination     Status: Abnormal   Collection Time: 12/11/23  1:59 PM   Urine  Result Value Ref Range   WBC, UA 6-10 (A) 0 - 5 /hpf   RBC, Urine 0-2 0 - 2 /hpf    Epithelial Cells (non renal) >10 (A) 0 - 10 /hpf   Bacteria, UA Few None seen/Few  BLADDER SCAN AMB NON-IMAGING     Status: None   Collection Time: 12/11/23  2:07 PM  Result Value Ref Range   Scan Result 0ml   CMP (Cancer Center only)     Status: Abnormal   Collection Time: 12/24/23 10:32 AM  Result Value Ref Range   Sodium 137 135 - 145 mmol/L   Potassium 3.6 3.5 - 5.1 mmol/L   Chloride 101 98 - 111 mmol/L  CO2 27 22 - 32 mmol/L   Glucose, Bld 155 (H) 70 - 99 mg/dL    Comment: Glucose reference range applies only to samples taken after fasting for at least 8 hours.   BUN 10 6 - 20 mg/dL   Creatinine 3.15 (H) 1.76 - 1.00 mg/dL   Calcium 8.6 (L) 8.9 - 10.3 mg/dL   Total Protein 6.9 6.5 - 8.1 g/dL   Albumin 3.8 3.5 - 5.0 g/dL   AST 19 15 - 41 U/L   ALT 14 0 - 44 U/L   Alkaline Phosphatase 67 38 - 126 U/L   Total Bilirubin 0.4 0.0 - 1.2 mg/dL   GFR, Estimated >16 >07 mL/min    Comment: (NOTE) Calculated using the CKD-EPI Creatinine Equation (2021)    Anion gap 9 5 - 15    Comment: Performed at [facility], [address] [place], [place] [address]  CBC with Differential (Cancer Center Only)     Status: Abnormal   Collection Time: 12/24/23 10:32 AM  Result Value Ref Range   WBC Count 6.0 4.0 - 10.5 K/uL   RBC 4.39 3.87 - 5.11 MIL/uL   Hemoglobin 14.0 12.0 - 15.0 g/dL   HCT 26.9 48.5 - 46.2 %   MCV 98.2 80.0 - 100.0 fL   MCH 31.9 26.0 - 34.0 pg   MCHC 32.5 30.0 - 36.0 g/dL   RDW 70.3 50.0 - 93.8 %   Platelet Count 139 (L) 150 - 400 K/uL   nRBC 0.0 0.0 - 0.2 %   Neutrophils Relative % 62 %   Neutro Abs 3.7 1.7 - 7.7 K/uL   Lymphocytes Relative 21 %   Lymphs Abs 1.2 0.7 - 4.0 K/uL   Monocytes Relative 14 %   Monocytes Absolute 0.8 0.1 - 1.0 K/uL   Eosinophils Relative 2 %   Eosinophils Absolute 0.1 0.0 - 0.5 K/uL   Basophils Relative 1 %   Basophils Absolute 0.1 0.0 - 0.1 K/uL   Immature Granulocytes 0 %   Abs Immature Granulocytes 0.02 0.00 - 0.07 K/uL     Comment: Performed at [facility], [address] [place], [place] [address]  CEA     Status: None   Collection Time: 12/24/23 10:32 AM  Result Value Ref Range   CEA 4.3 0.0 - 4.7 ng/mL    Comment: (NOTE)                             Nonsmokers          <3.9                             Smokers             <5.6 [employer] Electrochemiluminescence Immunoassay (ECLIA) Values obtained with different assay methods or kits cannot be used interchangeably.  Results cannot be interpreted as absolute evidence of the presence or absence of malignant disease. Performed At: [facility] [address] [place], [place] [id number] Jolene Schimke MD [id number]   Protein, urine, random     Status: None   Collection Time: 12/24/23 11:00 AM  Result Value Ref Range   Total Protein, Urine <6 mg/dL    Comment: NO NORMAL RANGE ESTABLISHED FOR THIS TEST Performed at [facility], [address], [place], [place] [address]   CMP (Cancer Center only)     Status: Abnormal  Collection Time: 01/07/24  8:19 AM  Result Value Ref Range   Sodium 138 135 - 145 mmol/L   Potassium 3.9 3.5 - 5.1 mmol/L   Chloride 104 98 - 111 mmol/L   CO2 24 22 - 32 mmol/L   Glucose, Bld 175 (H) 70 - 99 mg/dL    Comment: Glucose reference range applies only to samples taken after fasting for at least 8 hours.   BUN 16 6 - 20 mg/dL   Creatinine 1.61 0.96 - 1.00 mg/dL   Calcium 8.9 8.9 - 04.5 mg/dL   Total Protein 7.0 6.5 - 8.1 g/dL   Albumin 4.0 3.5 - 5.0 g/dL   AST 19 15 - 41 U/L   ALT 13 0 - 44 U/L   Alkaline Phosphatase 74 38 - 126 U/L   Total Bilirubin 0.5 0.0 - 1.2 mg/dL   GFR, Estimated >40 >98 mL/min    Comment: (NOTE) Calculated using the CKD-EPI Creatinine Equation (2021)    Anion gap 10 5 - 15    Comment: Performed at [facility], [address] [place], [place] [address]  CBC with Differential (Cancer Center Only)     Status: Abnormal   Collection Time: 01/07/24   8:19 AM  Result Value Ref Range   WBC Count 3.9 (L) 4.0 - 10.5 K/uL   RBC 4.55 3.87 - 5.11 MIL/uL   Hemoglobin 14.7 12.0 - 15.0 g/dL   HCT 78.2 95.6 - 21.3 %   MCV 95.6 80.0 - 100.0 fL   MCH 32.3 26.0 - 34.0 pg   MCHC 33.8 30.0 - 36.0 g/dL   RDW 08.6 57.8 - 46.9 %   Platelet Count 152 150 - 400 K/uL   nRBC 0.0 0.0 - 0.2 %   Neutrophils Relative % 64 %   Neutro Abs 2.5 1.7 - 7.7 K/uL   Lymphocytes Relative 20 %   Lymphs Abs 0.8 0.7 - 4.0 K/uL   Monocytes Relative 12 %   Monocytes Absolute 0.5 0.1 - 1.0 K/uL   Eosinophils Relative 3 %   Eosinophils Absolute 0.1 0.0 - 0.5 K/uL   Basophils Relative 1 %   Basophils Absolute 0.0 0.0 - 0.1 K/uL   Immature Granulocytes 0 %   Abs Immature Granulocytes 0.01 0.00 - 0.07 K/uL    Comment: Performed at [facility], [address] [place], [place] [address]  Protein, urine, random     Status: None   Collection Time: 01/07/24  8:19 AM  Result Value Ref Range   Total Protein, Urine 11 mg/dL    Comment: NO NORMAL RANGE ESTABLISHED FOR THIS TEST Performed at [facility], [address] [place], [place] [address]   CEA     Status: Abnormal   Collection Time: 01/07/24  8:19 AM  Result Value Ref Range   CEA 5.0 (H) 0.0 - 4.7 ng/mL    Comment: (NOTE)                             Nonsmokers          <3.9                             Smokers             <5.6 [employer] Electrochemiluminescence Immunoassay (ECLIA) Values obtained with different assay methods or kits cannot be used interchangeably.  Results cannot be  interpreted as absolute evidence of the presence or absence of malignant disease. Performed At: [facility] [address] [place], [place] [id number] Jolene Schimke MD [id number]   CMP (Cancer Center only)     Status: Abnormal   Collection Time: 01/21/24  8:43 AM  Result Value Ref Range   Sodium 140 135 - 145 mmol/L   Potassium 3.4 (L) 3.5 - 5.1 mmol/L   Chloride 105 98 - 111 mmol/L    CO2 25 22 - 32 mmol/L   Glucose, Bld 197 (H) 70 - 99 mg/dL    Comment: Glucose reference range applies only to samples taken after fasting for at least 8 hours.   BUN 14 6 - 20 mg/dL   Creatinine 8.75 6.43 - 1.00 mg/dL   Calcium 9.2 8.9 - 32.9 mg/dL   Total Protein 6.6 6.5 - 8.1 g/dL   Albumin 3.8 3.5 - 5.0 g/dL   AST 24 15 - 41 U/L   ALT 16 0 - 44 U/L   Alkaline Phosphatase 75 38 - 126 U/L   Total Bilirubin 0.4 0.0 - 1.2 mg/dL   GFR, Estimated >51 >88 mL/min    Comment: (NOTE) Calculated using the CKD-EPI Creatinine Equation (2021)    Anion gap 10 5 - 15    Comment: Performed at [name], [address] [place], [place] [address]  CBC with Differential (Cancer Center Only)     Status: Abnormal   Collection Time: 01/21/24  8:43 AM  Result Value Ref Range   WBC Count 5.0 4.0 - 10.5 K/uL   RBC 4.49 3.87 - 5.11 MIL/uL   Hemoglobin 14.3 12.0 - 15.0 g/dL   HCT 63.0 16.0 - 10.9 %   MCV 96.4 80.0 - 100.0 fL   MCH 31.8 26.0 - 34.0 pg   MCHC 33.0 30.0 - 36.0 g/dL   RDW 32.3 (H) 55.7 - 32.2 %   Platelet Count 120 (L) 150 - 400 K/uL   nRBC 0.0 0.0 - 0.2 %   Neutrophils Relative % 73 %   Neutro Abs 3.6 1.7 - 7.7 K/uL   Lymphocytes Relative 13 %   Lymphs Abs 0.7 0.7 - 4.0 K/uL   Monocytes Relative 11 %   Monocytes Absolute 0.6 0.1 - 1.0 K/uL   Eosinophils Relative 3 %   Eosinophils Absolute 0.1 0.0 - 0.5 K/uL   Basophils Relative 0 %   Basophils Absolute 0.0 0.0 - 0.1 K/uL   Immature Granulocytes 0 %   Abs Immature Granulocytes 0.02 0.00 - 0.07 K/uL    Comment: Performed at [facility], [address] [place], [place] [address]  Protein, urine, random     Status: None   Collection Time: 01/21/24  8:43 AM  Result Value Ref Range   Total Protein, Urine 56 mg/dL    Comment: NO NORMAL RANGE ESTABLISHED FOR THIS TEST Performed at [facility], [address] [place], [place] [address]   CEA     Status: Abnormal   Collection Time: 01/21/24  8:43 AM   Result Value Ref Range   CEA 5.9 (H) 0.0 - 4.7 ng/mL    Comment: (NOTE)                             Nonsmokers          <3.9  Smokers             <5.6 [employer] Electrochemiluminescence Immunoassay (ECLIA) Values obtained with different assay methods or kits cannot be used interchangeably.  Results cannot be interpreted as absolute evidence of the presence or absence of malignant disease. Performed At: [facility] [address] [place], [place] [id number] Jolene Schimke MD [id number]   CMP (Cancer Center only)     Status: Abnormal   Collection Time: 01/28/24  9:01 AM  Result Value Ref Range   Sodium 133 (L) 135 - 145 mmol/L   Potassium 3.4 (L) 3.5 - 5.1 mmol/L   Chloride 101 98 - 111 mmol/L   CO2 25 22 - 32 mmol/L   Glucose, Bld 184 (H) 70 - 99 mg/dL    Comment: Glucose reference range applies only to samples taken after fasting for at least 8 hours.   BUN 17 6 - 20 mg/dL   Creatinine 9.56 (H) 2.13 - 1.00 mg/dL   Calcium 8.9 8.9 - 08.6 mg/dL   Total Protein 6.9 6.5 - 8.1 g/dL   Albumin 4.0 3.5 - 5.0 g/dL   AST 21 15 - 41 U/L   ALT 19 0 - 44 U/L   Alkaline Phosphatase 74 38 - 126 U/L   Total Bilirubin 0.3 0.0 - 1.2 mg/dL   GFR, Estimated 55 (L) >60 mL/min    Comment: (NOTE) Calculated using the CKD-EPI Creatinine Equation (2021)    Anion gap 7 5 - 15    Comment: Performed at [facility], [address] [place], [place] [address]  CBC with Differential (Cancer Center Only)     Status: Abnormal   Collection Time: 01/28/24  9:01 AM  Result Value Ref Range   WBC Count 7.8 4.0 - 10.5 K/uL   RBC 4.27 3.87 - 5.11 MIL/uL   Hemoglobin 13.8 12.0 - 15.0 g/dL   HCT 96.2 95.2 - 84.1 %   MCV 96.3 80.0 - 100.0 fL   MCH 32.3 26.0 - 34.0 pg   MCHC 33.6 30.0 - 36.0 g/dL   RDW 32.4 (H) 40.1 - 02.7 %   Platelet Count 139 (L) 150 - 400 K/uL   nRBC 0.0 0.0 - 0.2 %   Neutrophils Relative % 65 %   Neutro Abs 5.1 1.7 - 7.7 K/uL    Lymphocytes Relative 19 %   Lymphs Abs 1.5 0.7 - 4.0 K/uL   Monocytes Relative 12 %   Monocytes Absolute 0.9 0.1 - 1.0 K/uL   Eosinophils Relative 1 %   Eosinophils Absolute 0.1 0.0 - 0.5 K/uL   Basophils Relative 0 %   Basophils Absolute 0.0 0.0 - 0.1 K/uL   Immature Granulocytes 3 %   Abs Immature Granulocytes 0.22 (H) 0.00 - 0.07 K/uL    Comment: Performed at [facility], [address] [place], [place] [address]  Protein, urine, random     Status: None   Collection Time: 01/28/24  9:01 AM  Result Value Ref Range   Total Protein, Urine 10 mg/dL    Comment: NO NORMAL RANGE ESTABLISHED FOR THIS TEST Performed at [facility], [address] [place], [place] [address]   CEA     Status: Abnormal   Collection Time: 01/28/24  9:01 AM  Result Value Ref Range   CEA 5.6 (H) 0.0 - 4.7 ng/mL    Comment: (NOTE)  Nonsmokers          <3.9                             Smokers             <5.6 [employer] Electrochemiluminescence Immunoassay (ECLIA) Values obtained with different assay methods or kits cannot be used interchangeably.  Results cannot be interpreted as absolute evidence of the presence or absence of malignant disease. Performed At: [facility] [address] [place], [place] [id number] Jolene Schimke MD [id number]   POCT glycosylated hemoglobin (Hb A1C)     Status: Abnormal   Collection Time: 02/05/24  2:12 PM  Result Value Ref Range   Hemoglobin A1C 7.3 (A) 4.0 - 5.6 %   HbA1c POC (<> result, manual entry)     HbA1c, POC (prediabetic range)     HbA1c, POC (controlled diabetic range)    CMP (Cancer Center only)     Status: Abnormal   Collection Time: 02/18/24  9:39 AM  Result Value Ref Range   Sodium 136 135 - 145 mmol/L   Potassium 4.0 3.5 - 5.1 mmol/L   Chloride 100 98 - 111 mmol/L   CO2 24 22 - 32 mmol/L   Glucose, Bld 151 (H) 70 - 99 mg/dL    Comment: Glucose reference range applies only to samples  taken after fasting for at least 8 hours.   BUN 18 6 - 20 mg/dL   Creatinine 9.56 (H) 2.13 - 1.00 mg/dL   Calcium 9.2 8.9 - 08.6 mg/dL   Total Protein 7.6 6.5 - 8.1 g/dL   Albumin 4.4 3.5 - 5.0 g/dL   AST 26 15 - 41 U/L   ALT 22 0 - 44 U/L   Alkaline Phosphatase 72 38 - 126 U/L   Total Bilirubin 0.4 0.0 - 1.2 mg/dL   GFR, Estimated >57 >84 mL/min    Comment: (NOTE) Calculated using the CKD-EPI Creatinine Equation (2021)    Anion gap 12 5 - 15    Comment: Performed at [facility], [address] [place], [place] [address]  CBC with Differential (Cancer Center Only)     Status: Abnormal   Collection Time: 02/18/24  9:39 AM  Result Value Ref Range   WBC Count 4.2 4.0 - 10.5 K/uL   RBC 4.90 3.87 - 5.11 MIL/uL   Hemoglobin 16.2 (H) 12.0 - 15.0 g/dL   HCT 52.8 (H) 41.3 - 24.4 %   MCV 98.6 80.0 - 100.0 fL   MCH 33.1 26.0 - 34.0 pg   MCHC 33.5 30.0 - 36.0 g/dL   RDW 01.0 (H) 27.2 - 53.6 %   Platelet Count 150 150 - 400 K/uL   nRBC 0.0 0.0 - 0.2 %   Neutrophils Relative % 55 %   Neutro Abs 2.3 1.7 - 7.7 K/uL   Lymphocytes Relative 25 %   Lymphs Abs 1.1 0.7 - 4.0 K/uL   Monocytes Relative 17 %   Monocytes Absolute 0.7 0.1 - 1.0 K/uL   Eosinophils Relative 2 %   Eosinophils Absolute 0.1 0.0 - 0.5 K/uL   Basophils Relative 1 %   Basophils Absolute 0.0 0.0 - 0.1 K/uL   Immature Granulocytes 0 %   Abs Immature Granulocytes 0.01 0.00 - 0.07 K/uL    Comment: Performed at [facility], [address] [place], [place] [address]  Protein, urine, random     Status: None  Collection Time: 02/18/24  9:39 AM  Result Value Ref Range   Total Protein, Urine 65 mg/dL    Comment: NO NORMAL RANGE ESTABLISHED FOR THIS TEST Performed at [facility], [address] [place], [place] [address]   CEA     Status: Abnormal   Collection Time: 02/18/24  9:39 AM  Result Value Ref Range   CEA 4.9 (H) 0.0 - 4.7 ng/mL    Comment: (NOTE)                             Nonsmokers           <3.9                             Smokers             <5.6 Roche [employer] Electrochemiluminescence Immunoassay (ECLIA) Values obtained with different assay methods or kits cannot be used interchangeably.  Results cannot be interpreted as absolute evidence of the presence or absence of malignant disease. Performed At: [facility] [address] [place], [place] [id number] Jolene Schimke MD MV:[id number]   POC Covid19/Flu A&B Antigen     Status: None   Collection Time: 02/28/24 11:40 AM  Result Value Ref Range   Influenza A Antigen, POC Negative Negative   Influenza B Antigen, POC Negative Negative   Covid Antigen, POC Negative Negative     Assessment & Plan  1. URI, acute (Primary)  - POC Covid19/Flu A&B Antigen - negative. Discussed loratadine/zyrtec otc, mucinex, rest , fluids. It may also be allergies   2. Mucopurulent chronic bronchitis (HCC)  She only resumed Brezti two days ago, reminded her to use it daily and needs to follow up with pulmonologist   3. Tachycardia  Improved with rest, it may be secondary to [facility] and we will monitor

## 2024-03-02 ENCOUNTER — Other Ambulatory Visit: Payer: Self-pay | Admitting: Family Medicine

## 2024-03-02 DIAGNOSIS — K219 Gastro-esophageal reflux disease without esophagitis: Secondary | ICD-10-CM

## 2024-03-02 DIAGNOSIS — E1129 Type 2 diabetes mellitus with other diabetic kidney complication: Secondary | ICD-10-CM

## 2024-03-02 DIAGNOSIS — E032 Hypothyroidism due to medicaments and other exogenous substances: Secondary | ICD-10-CM

## 2024-03-02 DIAGNOSIS — E1169 Type 2 diabetes mellitus with other specified complication: Secondary | ICD-10-CM

## 2024-03-03 ENCOUNTER — Inpatient Hospital Stay: Payer: 59

## 2024-03-03 ENCOUNTER — Inpatient Hospital Stay (HOSPITAL_BASED_OUTPATIENT_CLINIC_OR_DEPARTMENT_OTHER): Payer: 59 | Admitting: Oncology

## 2024-03-03 ENCOUNTER — Encounter: Payer: Self-pay | Admitting: Oncology

## 2024-03-03 VITALS — BP 129/82 | HR 81 | Temp 96.4°F | Resp 18 | Wt 138.7 lb

## 2024-03-03 DIAGNOSIS — Z79899 Other long term (current) drug therapy: Secondary | ICD-10-CM | POA: Diagnosis not present

## 2024-03-03 DIAGNOSIS — D6481 Anemia due to antineoplastic chemotherapy: Secondary | ICD-10-CM | POA: Diagnosis not present

## 2024-03-03 DIAGNOSIS — T451X5A Adverse effect of antineoplastic and immunosuppressive drugs, initial encounter: Secondary | ICD-10-CM | POA: Diagnosis not present

## 2024-03-03 DIAGNOSIS — F17210 Nicotine dependence, cigarettes, uncomplicated: Secondary | ICD-10-CM | POA: Diagnosis not present

## 2024-03-03 DIAGNOSIS — C20 Malignant neoplasm of rectum: Secondary | ICD-10-CM

## 2024-03-03 DIAGNOSIS — F209 Schizophrenia, unspecified: Secondary | ICD-10-CM | POA: Diagnosis not present

## 2024-03-03 DIAGNOSIS — Z7984 Long term (current) use of oral hypoglycemic drugs: Secondary | ICD-10-CM | POA: Diagnosis not present

## 2024-03-03 DIAGNOSIS — Z79890 Hormone replacement therapy (postmenopausal): Secondary | ICD-10-CM | POA: Diagnosis not present

## 2024-03-03 DIAGNOSIS — G620 Drug-induced polyneuropathy: Secondary | ICD-10-CM

## 2024-03-03 DIAGNOSIS — Z9221 Personal history of antineoplastic chemotherapy: Secondary | ICD-10-CM | POA: Diagnosis not present

## 2024-03-03 DIAGNOSIS — E1122 Type 2 diabetes mellitus with diabetic chronic kidney disease: Secondary | ICD-10-CM | POA: Diagnosis not present

## 2024-03-03 DIAGNOSIS — K521 Toxic gastroenteritis and colitis: Secondary | ICD-10-CM

## 2024-03-03 DIAGNOSIS — Z5111 Encounter for antineoplastic chemotherapy: Secondary | ICD-10-CM | POA: Diagnosis not present

## 2024-03-03 DIAGNOSIS — F319 Bipolar disorder, unspecified: Secondary | ICD-10-CM | POA: Diagnosis not present

## 2024-03-03 DIAGNOSIS — J9601 Acute respiratory failure with hypoxia: Secondary | ICD-10-CM | POA: Diagnosis not present

## 2024-03-03 DIAGNOSIS — R197 Diarrhea, unspecified: Secondary | ICD-10-CM | POA: Diagnosis not present

## 2024-03-03 DIAGNOSIS — Z923 Personal history of irradiation: Secondary | ICD-10-CM | POA: Diagnosis not present

## 2024-03-03 DIAGNOSIS — Z800 Family history of malignant neoplasm of digestive organs: Secondary | ICD-10-CM | POA: Diagnosis not present

## 2024-03-03 DIAGNOSIS — E1140 Type 2 diabetes mellitus with diabetic neuropathy, unspecified: Secondary | ICD-10-CM | POA: Diagnosis not present

## 2024-03-03 DIAGNOSIS — Z803 Family history of malignant neoplasm of breast: Secondary | ICD-10-CM | POA: Diagnosis not present

## 2024-03-03 DIAGNOSIS — N200 Calculus of kidney: Secondary | ICD-10-CM | POA: Diagnosis not present

## 2024-03-03 DIAGNOSIS — J441 Chronic obstructive pulmonary disease with (acute) exacerbation: Secondary | ICD-10-CM | POA: Diagnosis not present

## 2024-03-03 DIAGNOSIS — D122 Benign neoplasm of ascending colon: Secondary | ICD-10-CM | POA: Diagnosis not present

## 2024-03-03 DIAGNOSIS — K5900 Constipation, unspecified: Secondary | ICD-10-CM | POA: Diagnosis not present

## 2024-03-03 DIAGNOSIS — I700 Atherosclerosis of aorta: Secondary | ICD-10-CM | POA: Diagnosis not present

## 2024-03-03 LAB — CMP (CANCER CENTER ONLY)
ALT: 15 U/L (ref 0–44)
AST: 17 U/L (ref 15–41)
Albumin: 3.7 g/dL (ref 3.5–5.0)
Alkaline Phosphatase: 66 U/L (ref 38–126)
Anion gap: 10 (ref 5–15)
BUN: 22 mg/dL — ABNORMAL HIGH (ref 6–20)
CO2: 26 mmol/L (ref 22–32)
Calcium: 9.1 mg/dL (ref 8.9–10.3)
Chloride: 104 mmol/L (ref 98–111)
Creatinine: 1.15 mg/dL — ABNORMAL HIGH (ref 0.44–1.00)
GFR, Estimated: 58 mL/min — ABNORMAL LOW (ref >60)
Glucose, Bld: 123 mg/dL — ABNORMAL HIGH (ref 70–99)
Potassium: 4.3 mmol/L (ref 3.5–5.1)
Sodium: 140 mmol/L (ref 135–145)
Total Bilirubin: 0.5 mg/dL (ref 0.0–1.2)
Total Protein: 6.6 g/dL (ref 6.5–8.1)

## 2024-03-03 LAB — CBC WITH DIFFERENTIAL (CANCER CENTER ONLY)
Abs Immature Granulocytes: 0.02 10*3/uL (ref 0.00–0.07)
Basophils Absolute: 0 10*3/uL (ref 0.0–0.1)
Basophils Relative: 0 %
Eosinophils Absolute: 0.1 10*3/uL (ref 0.0–0.5)
Eosinophils Relative: 1 %
HCT: 39.3 % (ref 36.0–46.0)
Hemoglobin: 13.2 g/dL (ref 12.0–15.0)
Immature Granulocytes: 0 %
Lymphocytes Relative: 10 %
Lymphs Abs: 0.7 10*3/uL (ref 0.7–4.0)
MCH: 33.7 pg (ref 26.0–34.0)
MCHC: 33.6 g/dL (ref 30.0–36.0)
MCV: 100.3 fL — ABNORMAL HIGH (ref 80.0–100.0)
Monocytes Absolute: 0.6 10*3/uL (ref 0.1–1.0)
Monocytes Relative: 8 %
Neutro Abs: 5.4 10*3/uL (ref 1.7–7.7)
Neutrophils Relative %: 81 %
Platelet Count: 136 10*3/uL — ABNORMAL LOW (ref 150–400)
RBC: 3.92 MIL/uL (ref 3.87–5.11)
RDW: 16.8 % — ABNORMAL HIGH (ref 11.5–15.5)
WBC Count: 6.8 10*3/uL (ref 4.0–10.5)
nRBC: 0 % (ref 0.0–0.2)

## 2024-03-03 LAB — PROTEIN, URINE, RANDOM: Total Protein, Urine: 7 mg/dL

## 2024-03-03 MED ORDER — HEPARIN SOD (PORK) LOCK FLUSH 100 UNIT/ML IV SOLN
500.0000 [IU] | Freq: Once | INTRAVENOUS | Status: AC
  Administered 2024-03-03: 500 [IU] via INTRAVENOUS
  Filled 2024-03-03: qty 5

## 2024-03-03 MED ORDER — LOPERAMIDE HCL 2 MG PO CAPS: 2.0000 mg | ORAL_CAPSULE | ORAL | 2 refills | Status: AC

## 2024-03-03 NOTE — Progress Notes (Signed)
 Hematology/Oncology Progress note [phone] [phone]      CHIEF COMPLAINTS/REASON FOR VISIT:  Follow-up for Stage IV  rectal cancer treatments.  ASSESSMENT & PLAN:   Cancer Staging  Rectal cancer [facility]) Staging form: Colon and Rectum, AJCC 8th Edition - Pathologic stage from 05/03/2022: Stage IIIB (pT3, pN1b, cM0) - Signed by April Luna on 05/03/2022 - Pathologic stage from 06/18/2023: Stage IVA (rpTX, pN0, pM1a) - Signed by April Luna on 06/18/2023   Rectal cancer [facility]) History of Stage III. pT3 pN1b cM0, s/p APR. 05/2023 Stage IV current rectal adenocarcinoma Currently on adjuvant chemotherapy. S/p FOLFOX x 4 cycles S/p concurrent chemotherapy [Xeloda 1300 mg BID] with RT, and then another 4 cycles of adjuvant FOLFOX [dose reduced oxaliplatin and omit 5-FU bolus 05/2023 CT imaging indicates disease progression. liver biopsy pathology showed metastatic carcinoma--> FOLFIRI + Bevacizumab   [employer] NGS showed TP53 missense variant, APC frameshift, TCF7L2 Frameshift, FLT3 copy number gain., TMB 4.7, pMMR Wildtype KRAS/NRAS Labs are reviewed and discussed with patient. Hold off  FOLFIRI + Bevacizumab due to diarrhea.    Chemotherapy-induced neuropathy (HCC) Grade 2  Continue gabapentin 300mg  BID.   Chemotherapy induced diarrhea Recommend Imodium PRN as directed. Apparently she took Imodium Q6 hours.  I discussed the instructions with patient.     Orders Placed This Encounter  Procedures   CBC with Differential (Cancer Center Only)    Standing Status:   Future    Expected Date:   03/10/2024    Expiration Date:   03/10/2025   CMP (Cancer Center only)    Standing Status:   Future    Expected Date:   03/10/2024    Expiration Date:   03/10/2025    Follow-up 1 week(s)  All questions were answered. The patient knows to call the clinic with any problems, questions or concerns.  April Patience, MD, PhD [facility] Hematology Oncology 03/03/2024      HISTORY  OF PRESENTING ILLNESS:   April Luna is a  53 y.o.  female presents for treatment of rectal cancer Oncology History  Rectal cancer (HCC)  02/26/2022 Imaging   MRI PELVIS WITHOUT CONTRAST- By imaging, rectal cancer stage:  T1/T2, N0, Mx    03/02/2022 Imaging   CT CHEST AND ABDOMEN WITH CONTRAST 1. No convincing evidence of metastatic disease within the chest or abdomen. 2. Atrophic left kidney with multifocal renal scarring and cortical calcifications as well as nonobstructive renal stones measuring up to 5 mm. 3. Prominent left-sided predominant retroperitoneal lymph nodes measuring up to 8 mm near the level of the renal hilum, overall decreased in size dating back to CT September 19, 2018 and favored reactive related to left renal inflammation. 4.  Aortic Atherosclerosis (ICD10-I70.0).   03/27/2022 Genetic Testing    [employer] CustomNext+RNA cancer panel found no pathogenic mutations.    04/21/2022 Initial Diagnosis   Rectal cancer - baseline CEA 3.6 -02/09/2022, patient had colonoscopy which showed renal mass 10 cm from anal verge.  5 mm polyp in ascending colon.  Removed and retrieved. Pathology showed rectal adenocarcinoma.  The polyp in the ascending colon is a tubular adenoma.  MRI showed cT1/T2N0 disease  -04/21/2022, patient underwent robotic assisted ultralow anterior resection. Pathology showed moderately differentiated adenocarcinoma, 4.5 cm in maximal extent, with focal extension through muscularis propria into perirectal soft tissue.  3 lymph nodes positive for metastatic carcinoma.  Negative margin.  pT3 pN1b, MSI stable.   05/03/2022 Cancer Staging   Staging form: Colon and  Rectum, AJCC 8th Edition - Pathologic stage from 05/03/2022: Stage IIIB (pT3, pN1b, cM0) - Signed by April Luna on 05/03/2022 Stage prefix: Initial diagnosis   05/11/2022 Miscellaneous   Medi port placed by April Luna   05/26/2022 -  Chemotherapy   FOLFOX Q2 weeks x 4   05/26/2022 - 07/09/2022 Chemotherapy    Patient is on Treatment Plan : COLORECTAL FOLFOX q14d x 8 cycles     05/26/2022 - 12/13/2022 Chemotherapy   Patient is on Treatment Plan : COLORECTAL FOLFOX q14d x 4 months     07/27/2022 -  Chemotherapy   Concurrent chemotherapy- Xeloda  1300mg  BID and radiation.    07/27/2022 - 09/12/2022 Radiation Therapy    concurrent chemotherapy [Xeloda 1300 mg BID] with RT    09/20/2022 Imaging   CT Angiogram chest PET protocol There is no evidence of pulmonary artery embolism. There is no evidence of thoracic aortic dissection.   Small linear patchy alveolar infiltrate is seen in medial segment of right middle lobe suggesting atelectasis/pneumonia.     09/28/2022 - 09/30/2022 Hospital Admission   Admission due to acute respiratory failure with hypoxia, COPD exacerbation   09/28/2022 Imaging   CT angio chest PE protocol 1. Negative for acute PE or thoracic aortic dissection. 2. New ground-glass infiltrates anteriorly in bilateral upper lobes,may represent atypical edema, infectious or inflammatory process. 3.  Aortic Atherosclerosis     12/22/2022 Imaging   CT chest abdomen pelvis w contrast  Stable exam. No evidence of recurrent or metastatic carcinoma within the chest, abdomen, or pelvis.   Left nephrolithiasis and renal parenchymal atrophy. No evidence of ureteral calculi or hydronephrosis.   Aortic Atherosclerosis   05/31/2023 Imaging   CT chest abdomen pelvis with contrast showed 1. New hypodense 16 mm lesion in the right lobe of the liver with very subtle adjacent 5 mm lesion, suspicious for metastatic disease. 2. Small bilateral pulmonary nodules, some of which were subtly evident on prior examination only in retrospect, some of which demonstrate degree of cavitation, suspicious for metastatic disease. 3. Prominent retroperitoneal lymph nodes are similar prior. 4. Prior low anterior resection with Hartmann's pouch formation,similar small amount of perirectal/presacral soft tissue and  fluid,favored postsurgical/posttreatment change. Suggest continued attention on follow-up imaging. 5. Large volume of formed stool in the colon. 6. Nonobstructive left renal calculi measure under 1 cm.     06/08/2023 Relapse/Recurrence   Ultrasound-guided liver biopsy showed Pathology showed metastatic carcinoma, morphology consistent with patient's clinical history of rectal adenocarcinoma.  [employer] NGS 648 gene panel showed TP53 missense variant, APC frameshift, TCF7L2 frameshift, FLT3 copy number gain, TMB 4.33m/MB MSI stable.  Wildtype KRAS/NRAS  [employer] RNA Seq - ERBB3 overexpressed, VEGFA overexpressed, NRAS overexpressed.     06/18/2023 Cancer Staging   Staging form: Colon and Rectum, AJCC 8th Edition - Pathologic stage from 06/18/2023: Stage IVA (rpTX, pN0, pM1a) - Signed by April Luna on 06/18/2023 Stage prefix: Recurrence Total positive nodes: 0   06/25/2023 - 06/25/2023 Chemotherapy   Patient is on Treatment Plan : COLORECTAL FOLFOX + Bevacizumab q14d     07/02/2023 -  Chemotherapy   Patient is on Treatment Plan : COLORECTAL FOLFIRI + Bevacizumab q14d     11/13/2023 Imaging   CT chest abdomen pelvis w contrast showed  1. Increase in cavitation with decreased soft tissue involving the scattered bilateral pulmonary nodules, consistent with treatment response. No new suspicious pulmonary nodules or masses. 2. Decrease in size and conspicuity of the hypodense lesions in the right  lobe of the liver, consistent with treatment response. No new suspicious hepatic lesion identified. 3. Stable prominent nonspecific retroperitoneal lymph nodes. Suggest continued attention on follow-up imaging. 4. Similar surgical changes of prior low anterior resection with left anterior abdominal wall colostomy. Similar mesorectal/presacral soft tissue, no new suspicious enhancing nodularity. 5. Similar soft tissue nodularity in the bilateral posterior gluteal subcutaneous soft tissues. 6. Moderate  volume of formed stool in the colon. Correlate for constipation.   Patient has bipolar and schizophrenia..  Patient is married  She has housing issue due to previous history of eviction.  she does not live with her husband currently. She lives with some family members.   + chronic dysuria and urgency she was seen by Urology and was started on [place].  Symptoms are improved.    INTERVAL HISTORY JALONDA ANTIGUA is a 53 y.o. female who has above history reviewed by me today presents for follow up visit for rectal cancer.   Denies any melena or blood in the stool. Stable neuropathy symptoms. + recent URI, bronchitis. Recently seen PCP, symptoms have improved. No fever or chills.  She has increased output in her colostomy bag. She takes imodium every 6 hours PRN     Review of Systems  Constitutional:  Positive for fatigue. Negative for appetite change, chills and fever.  HENT:   Negative for hearing loss, mouth sores and voice change.   Eyes:  Negative for eye problems.  Respiratory:  Positive for cough. Negative for chest tightness and shortness of breath.   Cardiovascular:  Negative for chest pain.  Gastrointestinal:  Positive for diarrhea. Negative for abdominal distention, abdominal pain, blood in stool and nausea.       + colostomy  Endocrine: Negative for hot flashes.  Genitourinary:  Negative for difficulty urinating, dysuria and frequency.   Musculoskeletal:  Negative for arthralgias.  Skin:  Negative for itching and rash.  Neurological:  Positive for numbness. Negative for extremity weakness.  Hematological:  Negative for adenopathy.  Psychiatric/Behavioral:  Negative for confusion.     MEDICAL HISTORY:  Past Medical History:  Diagnosis Date   Allergy    pollen   Anxiety    Arthritis    right hip   Bipolar 1 disorder (HCC)    Cancer (HCC)    rectal   Chemotherapy-induced neuropathy (HCC) 10/24/2022   Chronic kidney disease    COPD (chronic obstructive  pulmonary disease) (HCC)    Depression    Family history of adverse reaction to anesthesia    grand father had a stroke during anesthesia   Family history of breast cancer    Family history of colon cancer    Family history of uterine cancer    GERD (gastroesophageal reflux disease)    History of kidney stones    Hyperlipidemia    Hypertension    Hypothyroidism    Panic attack    Pneumonia    Psoriasis    Sleep apnea 08/11/2021   No CPAP   Type 2 diabetes mellitus with microalbuminuria, without long-term current use of insulin (HCC) 06/24/2019    SURGICAL HISTORY: Past Surgical History:  Procedure Laterality Date   BREAST BIOPSY Left 12/14/2021   [place] bx, venus marker, path pending   CESAREAN SECTION     COLONOSCOPY WITH PROPOFOL N/A 02/09/2022   Procedure: COLONOSCOPY WITH PROPOFOL;  Surgeon: Toney Reil, Luna;  Location[facility];  Service: Endoscopy;  Laterality: N/A;  sleep apnea   CYSTOSCOPY W/ RETROGRADES Left 11/08/2018  Procedure: CYSTOSCOPY WITH RETROGRADE PYELOGRAM;  Surgeon: Sondra Come, Luna;  Location: [facility];  Service: Urology;  Laterality: Left;   CYSTOSCOPY/URETEROSCOPY/HOLMIUM LASER/STENT PLACEMENT Left 11/08/2018   Procedure: CYSTOSCOPY/URETEROSCOPY/HOLMIUM LASER/STENT PLACEMENT;  Surgeon: Sondra Come, Luna;  Location: [facility];  Service: Urology;  Laterality: Left;   IR CV LINE INJECTION  06/25/2023   IR IMAGING GUIDED PORT INSERTION  05/11/2022   IR REMOVE CV FIBRIN SHEATH  06/27/2023   MOUTH SURGERY     wisdom teeth extraction   MOUTH SURGERY     teeth removal   POLYPECTOMY N/A 02/09/2022   Procedure: POLYPECTOMY;  Surgeon: Toney Reil, Luna;  Location: [facility];  Service: Endoscopy;  Laterality: N/A;   XI ROBOTIC ASSISTED LOWER ANTERIOR RESECTION N/A 04/21/2022   Procedure: XI ROBOTIC ASSISTED LOWER ANTERIOR RESECTION WITH COLOSTOMY, BILATERAL TAP BLOCK, ASSESSMENT OF TISSUE PERFUSSION WITH FIREFLY INJECTION;  Surgeon:  Andria Meuse, Luna;  Location: [facility];  Service: General;  Laterality: N/A;    SOCIAL HISTORY: Social History   Socioeconomic History   Marital status: Married    Spouse name: Lesle Chris   Number of children: 1   Years of education: 12   Highest education level: High school graduate  Occupational History   Occupation: unemployed    Comment: disabled  Tobacco Use   Smoking status: Some Days    Current packs/day: 0.25    Average packs/day: 0.3 packs/day for 38.8 years (9.7 ttl pk-yrs)    Types: Cigarettes    Start date: 05/13/1985    Passive exposure: Current   Smokeless tobacco: Never   Tobacco comments:    5-6 cigarettes weekly- 11/07/2022  Vaping Use   Vaping status: Former   Start date: 05/25/2018   Quit date: 09/24/2018  Substance and Sexual Activity   Alcohol use: Not Currently    Alcohol/week: 4.0 standard drinks of alcohol    Types: 4 Glasses of wine per week    Comment: quit ETOH in Feb. 2023   Drug use: Yes    Types: Marijuana    Comment: 2 days ago   Sexual activity: Not Currently    Birth control/protection: Post-menopausal  Other Topics Concern   Not on file  Social History Narrative   Lives with husband and son    Social Drivers of Health   Financial Resource Strain: High Risk (08/30/2023)   Overall Financial Resource Strain (CARDIA)    Difficulty of Paying Living Expenses: Hard  Food Insecurity: Food Insecurity Present (08/30/2023)   Hunger Vital Sign    Worried About Running Out of Food in the Last Year: Sometimes true    Ran Out of Food in the Last Year: Sometimes true  Transportation Needs: No Transportation Needs (08/30/2023)   PRAPARE - Administrator, Civil Service (Medical): No    Lack of Transportation (Non-Medical): No  Physical Activity: Inactive (08/30/2023)   Exercise Vital Sign    Days of Exercise per Week: 0 days    Minutes of Exercise per Session: 0 min  Stress: Stress Concern Present (08/30/2023)   [employer]  of Occupational Health - Occupational Stress Questionnaire    Feeling of Stress : Very much  Social Connections: Moderately Integrated (08/30/2023)   Social Connection and Isolation Panel [NHANES]    Frequency of Communication with Friends and Family: Never    Frequency of Social Gatherings with Friends and Family: Once a week    Attends Religious Services: More than 4 times per  year    Active Member of Clubs or Organizations: No    Attends Banker Meetings: 1 to 4 times per year    Marital Status: Married  Catering manager Violence: Not At Risk (08/30/2023)   Humiliation, Afraid, Rape, and Kick questionnaire    Fear of Current or Ex-Partner: No    Emotionally Abused: No    Physically Abused: No    Sexually Abused: No    FAMILY HISTORY: Family History  Problem Relation Age of Onset   Depression Mother    Anxiety disorder Mother    Diabetes Mother    Hypertension Mother    Hyperlipidemia Mother    Cancer Mother    Uterine cancer Mother 14   Cervical cancer Mother 31   Colon cancer Father    Depression Brother    Anxiety disorder Brother    Cancer Maternal Aunt        unk types   Diabetes Mellitus II Maternal Grandmother    Hypercholesterolemia Maternal Grandmother    Breast cancer Maternal Grandmother    Cancer Paternal Grandmother    Diabetes Paternal Grandmother    Melanoma Paternal Grandmother    Stomach cancer Paternal Grandmother     ALLERGIES:  is allergic to metformin and related, nsaids, perphenazine, sulfa antibiotics, abilify [aripiprazole], and penicillins.  MEDICATIONS:  Current Outpatient Medications  Medication Sig Dispense Refill   acetaminophen (TYLENOL) 325 MG tablet Take 650 mg by mouth every 6 (six) hours as needed for headache (pain).     albuterol (VENTOLIN HFA) 108 (90 Base) MCG/ACT inhaler Inhale 2 puffs into the lungs every 6 (six) hours as needed for wheezing or shortness of breath. 1 each 5   amantadine (SYMMETREL) 100 MG capsule  Take 100 mg by mouth 2 (two) times daily.     blood glucose meter kit and supplies Dispense based on patient and insurance preference. Use up to four times daily as directed. (FOR ICD-10 E10.9, E11.9). 1 each 0   Budeson-Glycopyrrol-Formoterol (BREZTRI AEROSPHERE) 160-9-4.8 MCG/ACT AERO Inhale 2 puffs into the lungs in the morning and at bedtime. 10.7 g 2   buPROPion (WELLBUTRIN XL) 300 MG 24 hr tablet Take 300 mg by mouth daily.     Cholecalciferol (VITAMIN D-3) 125 MCG (5000 UT) TABS Take 5,000 Units by mouth daily. 30 tablet 1   clonazePAM (KLONOPIN) 0.5 MG tablet Take 1 mg by mouth 2 (two) times daily as needed.     dapagliflozin propanediol (FARXIGA) 10 MG TABS tablet TAKE 1 TABLET BY MOUTH DAILY BEFORE BREAKFAST 90 tablet 0   dexamethasone (DECADRON) 4 MG tablet Take 2 tablets (8 mg total) by mouth See admin instructions. Take 8mg  daily for 2 days after each chemotherapy. 30 tablet 1   diltiazem (CARDIZEM CD) 120 MG 24 hr capsule TAKE 1 CAPSULE BY MOUTH DAILY 90 capsule 1   docusate sodium (COLACE) 100 MG capsule TAKE 1 CAPSULE BY MOUTH TWICE DAILY 60 capsule 2   gabapentin (NEURONTIN) 300 MG capsule Take 300 mg by mouth 2 (two) times daily.     icosapent Ethyl (VASCEPA) 1 g capsule TAKE 2 CAPSULES BY MOUTH 2 TIMES DAILY 120 capsule 3   levothyroxine (SYNTHROID) 75 MCG tablet TAKE 1 TABLET BY MOUTH DAILY BEFORE BREAKFAST 90 tablet 2   lidocaine-prilocaine (EMLA) cream Apply to affected area once 30 g 3   loperamide (IMODIUM) 2 MG capsule Take 1 capsule (2 mg total) by mouth See admin instructions. Initial: 4 mg,the 2 mg every 2  hours (4 mg every 4 hours at night)  maximum: 16 mg/day 60 capsule 2   loratadine (CLARITIN) 10 MG tablet Take 10 mg by mouth every morning.     magic mouthwash w/lidocaine SOLN Take 5 mLs by mouth 4 (four) times daily as needed for mouth pain. Sig: Swish/Swallow 5-10 ml four times a day as needed. Dispense 480 ml. 1RF 480 mL 1   ondansetron (ZOFRAN) 8 MG tablet Take 1  tablet (8 mg total) by mouth every 8 (eight) hours as needed for nausea, vomiting or refractory nausea / vomiting. Start on the third day after chemotherapy. 30 tablet 1   paliperidone (INVEGA SUSTENNA) 234 MG/1.5ML SUSY injection Inject 234 mg into the muscle every 30 (thirty) days. On or about the 14th of each month     pantoprazole (PROTONIX) 40 MG tablet TAKE 1 TABLET BY MOUTH DAILY 90 tablet 2   pioglitazone (ACTOS) 15 MG tablet Take 1 tablet (15 mg total) by mouth daily. 90 tablet 1   promethazine (PHENERGAN) 12.5 MG tablet Take 1 tablet (12.5 mg total) by mouth every 6 (six) hours as needed for nausea or vomiting. 30 tablet 0   rosuvastatin (CRESTOR) 40 MG tablet TAKE 1 TABLET BY MOUTH EVERY MORNING 90 tablet 0   sertraline (ZOLOFT) 100 MG tablet Take 200 mg by mouth every morning.     Spacer/Aero-Holding Chambers (AEROCHAMBER MV) inhaler Use as instructed 1 each 0   Vibegron (GEMTESA) 75 MG TABS Take 1 tablet (75 mg total) by mouth daily.     No current facility-administered medications for this visit.   Facility-Administered Medications Ordered in Other Visits  Medication Dose Route Frequency Provider Last Rate Last Admin   albuterol (PROVENTIL) (2.5 MG/3ML) 0.083% nebulizer solution 2.5 mg  2.5 mg Nebulization Once Raechel Chute, Luna       sodium chloride flush (NS) 0.9 % injection 10 mL  10 mL Intracatheter PRN April Luna   10 mL at 08/15/23 1327     PHYSICAL EXAMINATION: ECOG PERFORMANCE STATUS: 0 - Asymptomatic Vitals:   03/03/24 0848  BP: 129/82  Pulse: 81  Resp: 18  Temp: (!) 96.4 F (35.8 C)  SpO2: 95%    Filed Weights   03/03/24 0848  Weight: 138 lb 11.2 oz (62.9 kg)     Physical Exam Constitutional:      General: She is not in acute distress. HENT:     Head: Normocephalic and atraumatic.     Mouth/Throat:     Comments: mucositis Eyes:     General: No scleral icterus. Cardiovascular:     Rate and Rhythm: Normal rate and regular rhythm.  Pulmonary:      Effort: Pulmonary effort is normal. No respiratory distress.     Breath sounds: No wheezing.     Comments: Decreased breath sound bilaterally Abdominal:     General: Bowel sounds are normal. There is no distension.     Palpations: Abdomen is soft.     Comments: +colostomy   Musculoskeletal:        General: No deformity. Normal range of motion.     Cervical back: Normal range of motion and neck supple.  Skin:    General: Skin is warm and dry.     Findings: No erythema or rash.  Neurological:     Mental Status: She is alert and oriented to person, place, and time. Mental status is at baseline.     Cranial Nerves: No cranial nerve deficit.  Coordination: Coordination normal.  Psychiatric:        Mood and Affect: Mood normal.     LABORATORY DATA:  I have reviewed the data as listed    Latest Ref Rng & Units 03/03/2024    8:31 AM 02/18/2024    9:39 AM 01/28/2024    9:01 AM  CBC  WBC 4.0 - 10.5 K/uL 6.8  4.2  7.8   Hemoglobin 12.0 - 15.0 g/dL 16.1  09.6  04.5   Hematocrit 36.0 - 46.0 % 39.3  48.3  41.1   Platelets 150 - 400 K/uL 136  150  139       Latest Ref Rng & Units 03/03/2024    8:31 AM 02/18/2024    9:39 AM 01/28/2024    9:01 AM  CMP  Glucose 70 - 99 mg/dL 409  811  914   BUN 6 - 20 mg/dL 22  18  17    Creatinine 0.44 - 1.00 mg/dL 7.82  9.56  2.13   Sodium 135 - 145 mmol/L 140  136  133   Potassium 3.5 - 5.1 mmol/L 4.3  4.0  3.4   Chloride 98 - 111 mmol/L 104  100  101   CO2 22 - 32 mmol/L 26  24  25    Calcium 8.9 - 10.3 mg/dL 9.1  9.2  8.9   Total Protein 6.5 - 8.1 g/dL 6.6  7.6  6.9   Total Bilirubin 0.0 - 1.2 mg/dL 0.5  0.4  0.3   Alkaline Phos 38 - 126 U/L 66  72  74   AST 15 - 41 U/L 17  26  21    ALT 0 - 44 U/L 15  22  19          RADIOGRAPHIC STUDIES: I have personally reviewed the radiological images as listed and agreed with the findings in the report. No results found.

## 2024-03-03 NOTE — Assessment & Plan Note (Signed)
 Grade 2  Continue gabapentin 300mg  BID.

## 2024-03-03 NOTE — Assessment & Plan Note (Signed)
 Recommend Imodium PRN as directed. Apparently she took Imodium Q6 hours.  I discussed the instructions with patient.

## 2024-03-03 NOTE — Assessment & Plan Note (Addendum)
 History of Stage III. pT3 pN1b cM0, s/p APR. 05/2023 Stage IV current rectal adenocarcinoma Currently on adjuvant chemotherapy. S/p FOLFOX x 4 cycles S/p concurrent chemotherapy [Xeloda 1300 mg BID] with RT, and then another 4 cycles of adjuvant FOLFOX [dose reduced oxaliplatin and omit 5-FU bolus 05/2023 CT imaging indicates disease progression. liver biopsy pathology showed metastatic carcinoma--> FOLFIRI + Bevacizumab   [facility] NGS showed TP53 missense variant, APC frameshift, TCF7L2 Frameshift, FLT3 copy number gain., TMB 4.7, pMMR Wildtype KRAS/NRAS Labs are reviewed and discussed with patient. Hold off  FOLFIRI + Bevacizumab due to diarrhea.

## 2024-03-04 LAB — CEA: CEA: 4.2 ng/mL (ref 0.0–4.7)

## 2024-03-05 ENCOUNTER — Inpatient Hospital Stay: Payer: 59

## 2024-03-10 ENCOUNTER — Inpatient Hospital Stay (HOSPITAL_BASED_OUTPATIENT_CLINIC_OR_DEPARTMENT_OTHER): Admitting: Oncology

## 2024-03-10 ENCOUNTER — Inpatient Hospital Stay

## 2024-03-10 ENCOUNTER — Encounter: Payer: Self-pay | Admitting: Oncology

## 2024-03-10 VITALS — BP 121/88 | HR 80 | Temp 96.0°F | Resp 18 | Wt 133.6 lb

## 2024-03-10 DIAGNOSIS — Z5111 Encounter for antineoplastic chemotherapy: Secondary | ICD-10-CM

## 2024-03-10 DIAGNOSIS — K521 Toxic gastroenteritis and colitis: Secondary | ICD-10-CM | POA: Diagnosis not present

## 2024-03-10 DIAGNOSIS — J411 Mucopurulent chronic bronchitis: Secondary | ICD-10-CM

## 2024-03-10 DIAGNOSIS — G620 Drug-induced polyneuropathy: Secondary | ICD-10-CM | POA: Diagnosis not present

## 2024-03-10 DIAGNOSIS — C20 Malignant neoplasm of rectum: Secondary | ICD-10-CM | POA: Diagnosis not present

## 2024-03-10 DIAGNOSIS — T451X5A Adverse effect of antineoplastic and immunosuppressive drugs, initial encounter: Secondary | ICD-10-CM

## 2024-03-10 LAB — CBC WITH DIFFERENTIAL (CANCER CENTER ONLY)
Abs Immature Granulocytes: 0.01 10*3/uL (ref 0.00–0.07)
Basophils Absolute: 0 10*3/uL (ref 0.0–0.1)
Basophils Relative: 1 %
Eosinophils Absolute: 0.1 10*3/uL (ref 0.0–0.5)
Eosinophils Relative: 2 %
HCT: 43.7 % (ref 36.0–46.0)
Hemoglobin: 14.5 g/dL (ref 12.0–15.0)
Immature Granulocytes: 0 %
Lymphocytes Relative: 20 %
Lymphs Abs: 0.8 10*3/uL (ref 0.7–4.0)
MCH: 33.5 pg (ref 26.0–34.0)
MCHC: 33.2 g/dL (ref 30.0–36.0)
MCV: 100.9 fL — ABNORMAL HIGH (ref 80.0–100.0)
Monocytes Absolute: 0.6 10*3/uL (ref 0.1–1.0)
Monocytes Relative: 15 %
Neutro Abs: 2.4 10*3/uL (ref 1.7–7.7)
Neutrophils Relative %: 62 %
Platelet Count: 157 10*3/uL (ref 150–400)
RBC: 4.33 MIL/uL (ref 3.87–5.11)
RDW: 16.4 % — ABNORMAL HIGH (ref 11.5–15.5)
WBC Count: 3.9 10*3/uL — ABNORMAL LOW (ref 4.0–10.5)
nRBC: 0 % (ref 0.0–0.2)

## 2024-03-10 LAB — CMP (CANCER CENTER ONLY)
ALT: 16 U/L (ref 0–44)
AST: 19 U/L (ref 15–41)
Albumin: 4.1 g/dL (ref 3.5–5.0)
Alkaline Phosphatase: 70 U/L (ref 38–126)
Anion gap: 9 (ref 5–15)
BUN: 18 mg/dL (ref 6–20)
CO2: 28 mmol/L (ref 22–32)
Calcium: 9.2 mg/dL (ref 8.9–10.3)
Chloride: 102 mmol/L (ref 98–111)
Creatinine: 1.09 mg/dL — ABNORMAL HIGH (ref 0.44–1.00)
GFR, Estimated: >60 mL/min (ref >60)
Glucose, Bld: 128 mg/dL — ABNORMAL HIGH (ref 70–99)
Potassium: 4 mmol/L (ref 3.5–5.1)
Sodium: 139 mmol/L (ref 135–145)
Total Bilirubin: 0.4 mg/dL (ref 0.0–1.2)
Total Protein: 7.2 g/dL (ref 6.5–8.1)

## 2024-03-10 MED ORDER — ATROPINE SULFATE 1 MG/ML IV SOLN
0.5000 mg | Freq: Once | INTRAVENOUS | Status: AC | PRN
  Administered 2024-03-10: 0.5 mg via INTRAVENOUS

## 2024-03-10 MED ORDER — IRINOTECAN HCL CHEMO INJECTION 100 MG/5ML
180.0000 mg/m2 | Freq: Once | INTRAVENOUS | Status: AC
  Administered 2024-03-10: 300 mg via INTRAVENOUS
  Filled 2024-03-10: qty 15

## 2024-03-10 MED ORDER — FLUOROURACIL CHEMO INJECTION 2.5 GM/50ML
400.0000 mg/m2 | Freq: Once | INTRAVENOUS | Status: AC
  Administered 2024-03-10: 650 mg via INTRAVENOUS
  Filled 2024-03-10: qty 13

## 2024-03-10 MED ORDER — SODIUM CHLORIDE 0.9 % IV SOLN
2400.0000 mg/m2 | INTRAVENOUS | Status: DC
  Administered 2024-03-10: 3850 mg via INTRAVENOUS
  Filled 2024-03-10: qty 77

## 2024-03-10 MED ORDER — SODIUM CHLORIDE 0.9 % IV SOLN
400.0000 mg/m2 | Freq: Once | INTRAVENOUS | Status: AC
  Administered 2024-03-10: 644 mg via INTRAVENOUS
  Filled 2024-03-10: qty 25

## 2024-03-10 MED ORDER — SODIUM CHLORIDE 0.9 % IV SOLN
Freq: Once | INTRAVENOUS | Status: AC
  Filled 2024-03-10: qty 250

## 2024-03-10 MED ORDER — DEXAMETHASONE SODIUM PHOSPHATE 10 MG/ML IJ SOLN
10.0000 mg | Freq: Once | INTRAMUSCULAR | Status: AC
  Administered 2024-03-10: 10 mg via INTRAVENOUS
  Filled 2024-03-10: qty 1

## 2024-03-10 MED ORDER — SODIUM CHLORIDE 0.9 % IV SOLN
5.0000 mg/kg | Freq: Once | INTRAVENOUS | Status: AC
  Administered 2024-03-10: 300 mg via INTRAVENOUS
  Filled 2024-03-10: qty 12

## 2024-03-10 MED ORDER — PALONOSETRON HCL INJECTION 0.25 MG/5ML
0.2500 mg | Freq: Once | INTRAVENOUS | Status: AC
Start: 2024-03-10 — End: 2024-03-10
  Administered 2024-03-10: 0.25 mg via INTRAVENOUS
  Filled 2024-03-10: qty 5

## 2024-03-10 NOTE — Progress Notes (Signed)
 Hematology/Oncology Progress note [phone] [phone]      CHIEF COMPLAINTS/REASON FOR VISIT:  Follow-up for Stage IV  rectal cancer treatments.  ASSESSMENT & PLAN:   Cancer Staging  Rectal cancer [facility]) Staging form: Colon and Rectum, AJCC 8th Edition - Pathologic stage from 05/03/2022: Stage IIIB (pT3, pN1b, cM0) - Signed by [name], MD on 05/03/2022 - Pathologic stage from 06/18/2023: Stage IVA (rpTX, pN0, pM1a) - Signed by [name], MD on 06/18/2023   Rectal cancer [facility]) History of Stage III. pT3 pN1b cM0, s/p APR. 05/2023 Stage IV current rectal adenocarcinoma Currently on adjuvant chemotherapy. S/p FOLFOX x 4 cycles S/p concurrent chemotherapy [Xeloda 1300 mg BID] with RT, and then another 4 cycles of adjuvant FOLFOX [dose reduced oxaliplatin and omit 5-FU bolus 05/2023 CT imaging indicates disease progression. liver biopsy pathology showed metastatic carcinoma--> FOLFIRI + Bevacizumab   NGS showed TP53 missense variant, APC frameshift, TCF7L2 Frameshift, FLT3 copy number gain., TMB 4.7, pMMR Wildtype KRAS/NRAS Labs are reviewed and discussed with patient. Proceed with  FOLFIRI + Bevacizumab due to diarrhea.  CT scan showed further treatment response. Continue current regimen.    Chemotherapy-induced neuropathy (HCC) Grade 2  Continue gabapentin 300mg  BID.   Encounter for antineoplastic chemotherapy Chemotherapy plan as listed above  Chemotherapy induced diarrhea Recommend Imodium PRN as directed.   COPD (chronic obstructive pulmonary disease) (HCC) Recommend patient to use her inhalers and stop smoking     Orders Placed This Encounter  Procedures   CEA    Standing Status:   Future    Expected Date:   03/24/2024    Expiration Date:   03/24/2025   Protein, urine, random    Standing Status:   Future    Expected Date:   03/24/2024    Expiration Date:   03/24/2025   CBC with Differential (Cancer Center Only)    Standing Status:   Future     Expected Date:   03/24/2024    Expiration Date:   03/24/2025   CMP (Cancer Center only)    Standing Status:   Future    Expected Date:   03/24/2024    Expiration Date:   03/24/2025    Follow-up 2 week(s)  All questions were answered. The patient knows to call the clinic with any problems, questions or concerns.  [name], MD, PhD [facility] Hematology Oncology 03/10/2024      HISTORY OF PRESENTING ILLNESS:   April Luna is a  53 y.o.  female presents for treatment of rectal cancer Oncology History  Rectal cancer (HCC)  02/26/2022 Imaging   MRI PELVIS WITHOUT CONTRAST- By imaging, rectal cancer stage:  T1/T2, N0, Mx    03/02/2022 Imaging   CT CHEST AND ABDOMEN WITH CONTRAST 1. No convincing evidence of metastatic disease within the chest or abdomen. 2. Atrophic left kidney with multifocal renal scarring and cortical calcifications as well as nonobstructive renal stones measuring up to 5 mm. 3. Prominent left-sided predominant retroperitoneal lymph nodes measuring up to 8 mm near the level of the renal hilum, overall decreased in size dating back to CT September 19, 2018 and favored reactive related to left renal inflammation. 4.  Aortic Atherosclerosis (ICD10-I70.0).   03/27/2022 Genetic Testing    [facility] Custom[employer]+RNA cancer panel found no pathogenic mutations.    04/21/2022 Initial Diagnosis   Rectal cancer - baseline CEA 3.6 -02/09/2022, patient had colonoscopy which showed renal mass 10 cm from anal verge.  5 mm polyp in ascending colon.  Removed and retrieved. Pathology showed rectal adenocarcinoma.  The polyp in the ascending colon is a tubular adenoma.  MRI showed cT1/T2N0 disease  -04/21/2022, patient underwent robotic assisted ultralow anterior resection. Pathology showed moderately differentiated adenocarcinoma, 4.5 cm in maximal extent, with focal extension through muscularis propria into perirectal soft tissue.  3 lymph nodes positive for metastatic carcinoma.   Negative margin.  pT3 pN1b, MSI stable.   05/03/2022 Cancer Staging   Staging form: Colon and Rectum, AJCC 8th Edition - Pathologic stage from 05/03/2022: Stage IIIB (pT3, pN1b, cM0) - Signed by [name], MD on 05/03/2022 Stage prefix: Initial diagnosis   05/11/2022 Miscellaneous   Medi port placed by Dr.White   05/26/2022 -  Chemotherapy   FOLFOX Q2 weeks x 4   05/26/2022 - 07/09/2022 Chemotherapy   Patient is on Treatment Plan : COLORECTAL FOLFOX q14d x 8 cycles     05/26/2022 - 12/13/2022 Chemotherapy   Patient is on Treatment Plan : COLORECTAL FOLFOX q14d x 4 months     07/27/2022 -  Chemotherapy   Concurrent chemotherapy- Xeloda  1300mg  BID and radiation.    07/27/2022 - 09/12/2022 Radiation Therapy    concurrent chemotherapy [Xeloda 1300 mg BID] with RT    09/20/2022 Imaging   CT Angiogram chest PET protocol There is no evidence of pulmonary artery embolism. There is no evidence of thoracic aortic dissection.   Small linear patchy alveolar infiltrate is seen in medial segment of right middle lobe suggesting atelectasis/pneumonia.     09/28/2022 - 09/30/2022 Hospital Admission   Admission due to acute respiratory failure with hypoxia, COPD exacerbation   09/28/2022 Imaging   CT angio chest PE protocol 1. Negative for acute PE or thoracic aortic dissection. 2. New ground-glass infiltrates anteriorly in bilateral upper lobes,may represent atypical edema, infectious or inflammatory process. 3.  Aortic Atherosclerosis     12/22/2022 Imaging   CT chest abdomen pelvis w contrast  Stable exam. No evidence of recurrent or metastatic carcinoma within the chest, abdomen, or pelvis.   Left nephrolithiasis and renal parenchymal atrophy. No evidence of ureteral calculi or hydronephrosis.   Aortic Atherosclerosis   05/31/2023 Imaging   CT chest abdomen pelvis with contrast showed 1. New hypodense 16 mm lesion in the right lobe of the liver with very subtle adjacent 5 mm lesion, suspicious for  metastatic disease. 2. Small bilateral pulmonary nodules, some of which were subtly evident on prior examination only in retrospect, some of which demonstrate degree of cavitation, suspicious for metastatic disease. 3. Prominent retroperitoneal lymph nodes are similar prior. 4. Prior low anterior resection with Hartmann's pouch formation,similar small amount of perirectal/presacral soft tissue and fluid,favored postsurgical/posttreatment change. Suggest continued attention on follow-up imaging. 5. Large volume of formed stool in the colon. 6. Nonobstructive left renal calculi measure under 1 cm.     06/08/2023 Relapse/Recurrence   Ultrasound-guided liver biopsy showed Pathology showed metastatic carcinoma, morphology consistent with patient's clinical history of rectal adenocarcinoma.  [employer] NGS 648 gene panel showed TP53 missense variant, APC frameshift, TCF7L2 frameshift, FLT3 copy number gain, TMB 4.53m/MB MSI stable.  Wildtype KRAS/NRAS  [employer] RNA Seq - ERBB3 overexpressed, VEGFA overexpressed, NRAS overexpressed.     06/18/2023 Cancer Staging   Staging form: Colon and Rectum, AJCC 8th Edition - Pathologic stage from 06/18/2023: Stage IVA (rpTX, pN0, pM1a) - Signed by [name], MD on 06/18/2023 Stage prefix: Recurrence Total positive nodes: 0   06/25/2023 - 06/25/2023 Chemotherapy   Patient is on Treatment Plan : COLORECTAL  FOLFOX + Bevacizumab q14d     07/02/2023 -  Chemotherapy   Patient is on Treatment Plan : COLORECTAL FOLFIRI + Bevacizumab q14d     11/13/2023 Imaging   CT chest abdomen pelvis w contrast showed  1. Increase in cavitation with decreased soft tissue involving the scattered bilateral pulmonary nodules, consistent with treatment response. No new suspicious pulmonary nodules or masses. 2. Decrease in size and conspicuity of the hypodense lesions in the right lobe of the liver, consistent with treatment response. No new suspicious hepatic lesion identified. 3. Stable  prominent nonspecific retroperitoneal lymph nodes. Suggest continued attention on follow-up imaging. 4. Similar surgical changes of prior low anterior resection with left anterior abdominal wall colostomy. Similar mesorectal/presacral soft tissue, no new suspicious enhancing nodularity. 5. Similar soft tissue nodularity in the bilateral posterior gluteal subcutaneous soft tissues. 6. Moderate volume of formed stool in the colon. Correlate for constipation.   Patient has bipolar and schizophrenia..  Patient is married  She has housing issue due to previous history of eviction.  she does not live with her husband currently. She lives with some family members.   + chronic dysuria and urgency she was seen by Urology and was started on [place].  Symptoms are improved.    INTERVAL HISTORY DEIDRE CARINO is a 53 y.o. female who has above history reviewed by me today presents for follow up visit for rectal cancer.   Denies any melena or blood in the stool. Stable neuropathy symptoms. Cough has improved.  No fever or chills.  Colostomy output has decreased, close to her baseline.    Review of Systems  Constitutional:  Positive for fatigue. Negative for appetite change, chills and fever.  HENT:   Negative for hearing loss, mouth sores and voice change.   Eyes:  Negative for eye problems.  Respiratory:  Negative for chest tightness, cough and shortness of breath.   Cardiovascular:  Negative for chest pain.  Gastrointestinal:  Positive for diarrhea. Negative for abdominal distention, abdominal pain, blood in stool and nausea.       + colostomy  Endocrine: Negative for hot flashes.  Genitourinary:  Negative for difficulty urinating, dysuria and frequency.   Musculoskeletal:  Negative for arthralgias.  Skin:  Negative for itching and rash.  Neurological:  Positive for numbness. Negative for extremity weakness.  Hematological:  Negative for adenopathy.  Psychiatric/Behavioral:  Negative  for confusion.     MEDICAL HISTORY:  Past Medical History:  Diagnosis Date   Allergy    pollen   Anxiety    Arthritis    right hip   Bipolar 1 disorder (HCC)    Cancer (HCC)    rectal   Chemotherapy-induced neuropathy (HCC) 10/24/2022   Chronic kidney disease    COPD (chronic obstructive pulmonary disease) (HCC)    Depression    Family history of adverse reaction to anesthesia    grand father had a stroke during anesthesia   Family history of breast cancer    Family history of colon cancer    Family history of uterine cancer    GERD (gastroesophageal reflux disease)    History of kidney stones    Hyperlipidemia    Hypertension    Hypothyroidism    Panic attack    Pneumonia    Psoriasis    Sleep apnea 08/11/2021   No CPAP   Type 2 diabetes mellitus with microalbuminuria, without long-term current use of insulin (HCC) 06/24/2019    SURGICAL HISTORY: Past Surgical History:  Procedure Laterality Date   BREAST BIOPSY Left 12/14/2021   [place] bx, venus marker, path pending   CESAREAN SECTION     COLONOSCOPY WITH PROPOFOL N/A 02/09/2022   Procedure: COLONOSCOPY WITH PROPOFOL;  Surgeon: Toney Reil, MD;  Location: [facility];  Service: Endoscopy;  Laterality: N/A;  sleep apnea   CYSTOSCOPY W/ RETROGRADES Left 11/08/2018   Procedure: CYSTOSCOPY WITH RETROGRADE PYELOGRAM;  Surgeon: Sondra Come, MD;  Location: [facility];  Service: Urology;  Laterality: Left;   CYSTOSCOPY/URETEROSCOPY/HOLMIUM LASER/STENT PLACEMENT Left 11/08/2018   Procedure: CYSTOSCOPY/URETEROSCOPY/HOLMIUM LASER/STENT PLACEMENT;  Surgeon: Sondra Come, MD;  Location: [facility];  Service: Urology;  Laterality: Left;   IR CV LINE INJECTION  06/25/2023   IR IMAGING GUIDED PORT INSERTION  05/11/2022   IR REMOVE CV FIBRIN SHEATH  06/27/2023   MOUTH SURGERY     wisdom teeth extraction   MOUTH SURGERY     teeth removal   POLYPECTOMY N/A 02/09/2022   Procedure: POLYPECTOMY;  Surgeon: Toney Reil, MD;  Location: [facility];  Service: Endoscopy;  Laterality: N/A;   XI ROBOTIC ASSISTED LOWER ANTERIOR RESECTION N/A 04/21/2022   Procedure: XI ROBOTIC ASSISTED LOWER ANTERIOR RESECTION WITH COLOSTOMY, BILATERAL TAP BLOCK, ASSESSMENT OF TISSUE PERFUSSION WITH FIREFLY INJECTION;  Surgeon: Andria Meuse, MD;  Location: [facility];  Service: General;  Laterality: N/A;    SOCIAL HISTORY: Social History   Socioeconomic History   Marital status: Married    Spouse name: Lesle Chris   Number of children: 1   Years of education: 12   Highest education level: High school graduate  Occupational History   Occupation: unemployed    Comment: disabled  Tobacco Use   Smoking status: Some Days    Current packs/day: 0.25    Average packs/day: 0.3 packs/day for 38.8 years (9.7 ttl pk-yrs)    Types: Cigarettes    Start date: 05/13/1985    Passive exposure: Current   Smokeless tobacco: Never   Tobacco comments:    5-6 cigarettes weekly- 11/07/2022  Vaping Use   Vaping status: Former   Start date: 05/25/2018   Quit date: 09/24/2018  Substance and Sexual Activity   Alcohol use: Not Currently    Alcohol/week: 4.0 standard drinks of alcohol    Types: 4 Glasses of wine per week    Comment: quit ETOH in Feb. 2023   Drug use: Yes    Types: Marijuana    Comment: 2 days ago   Sexual activity: Not Currently    Birth control/protection: Post-menopausal  Other Topics Concern   Not on file  Social History Narrative   Lives with husband and son    Social Drivers of Health   Financial Resource Strain: High Risk (08/30/2023)   Overall Financial Resource Strain (CARDIA)    Difficulty of Paying Living Expenses: Hard  Food Insecurity: Food Insecurity Present (08/30/2023)   Hunger Vital Sign    Worried About Running Out of Food in the Last Year: Sometimes true    Ran Out of Food in the Last Year: Sometimes true  Transportation Needs: No Transportation Needs (08/30/2023)   PRAPARE -  Administrator, Civil Service (Medical): No    Lack of Transportation (Non-Medical): No  Physical Activity: Inactive (08/30/2023)   Exercise Vital Sign    Days of Exercise per Week: 0 days    Minutes of Exercise per Session: 0 min  Stress: Stress Concern Present (08/30/2023)   [employer] of  Occupational Health - Occupational Stress Questionnaire    Feeling of Stress : Very much  Social Connections: Moderately Integrated (08/30/2023)   Social Connection and Isolation Panel [NHANES]    Frequency of Communication with Friends and Family: Never    Frequency of Social Gatherings with Friends and Family: Once a week    Attends Religious Services: More than 4 times per year    Active Member of [employer] or Organizations: No    Attends Engineer, structural: 1 to 4 times per year    Marital Status: Married  Catering manager Violence: Not At Risk (08/30/2023)   Humiliation, Afraid, Rape, and Kick questionnaire    Fear of Current or Ex-Partner: No    Emotionally Abused: No    Physically Abused: No    Sexually Abused: No    FAMILY HISTORY: Family History  Problem Relation Age of Onset   Depression Mother    Anxiety disorder Mother    Diabetes Mother    Hypertension Mother    Hyperlipidemia Mother    Cancer Mother    Uterine cancer Mother 36   Cervical cancer Mother 64   Colon cancer Father    Depression Brother    Anxiety disorder Brother    Cancer Maternal Aunt        unk types   Diabetes Mellitus II Maternal Grandmother    Hypercholesterolemia Maternal Grandmother    Breast cancer Maternal Grandmother    Cancer Paternal Grandmother    Diabetes Paternal Grandmother    Melanoma Paternal Grandmother    Stomach cancer Paternal Grandmother     ALLERGIES:  is allergic to metformin and related, nsaids, perphenazine, sulfa antibiotics, abilify [aripiprazole], and penicillins.  MEDICATIONS:  Current Outpatient Medications  Medication Sig Dispense Refill    acetaminophen (TYLENOL) 325 MG tablet Take 650 mg by mouth every 6 (six) hours as needed for headache (pain).     albuterol (VENTOLIN HFA) 108 (90 Base) MCG/ACT inhaler Inhale 2 puffs into the lungs every 6 (six) hours as needed for wheezing or shortness of breath. 1 each 5   amantadine (SYMMETREL) 100 MG capsule Take 100 mg by mouth 2 (two) times daily.     blood glucose meter kit and supplies Dispense based on patient and insurance preference. Use up to four times daily as directed. (FOR ICD-10 E10.9, E11.9). 1 each 0   Budeson-Glycopyrrol-Formoterol (BREZTRI AEROSPHERE) 160-9-4.8 MCG/ACT AERO Inhale 2 puffs into the lungs in the morning and at bedtime. 10.7 g 2   buPROPion (WELLBUTRIN XL) 300 MG 24 hr tablet Take 300 mg by mouth daily.     Cholecalciferol (VITAMIN D-3) 125 MCG (5000 UT) TABS Take 5,000 Units by mouth daily. 30 tablet 1   clonazePAM (KLONOPIN) 0.5 MG tablet Take 1 mg by mouth 2 (two) times daily as needed.     dexamethasone (DECADRON) 4 MG tablet Take 2 tablets (8 mg total) by mouth See admin instructions. Take 8mg  daily for 2 days after each chemotherapy. 30 tablet 1   diltiazem (CARDIZEM CD) 120 MG 24 hr capsule TAKE 1 CAPSULE BY MOUTH DAILY 90 capsule 1   docusate sodium (COLACE) 100 MG capsule TAKE 1 CAPSULE BY MOUTH TWICE DAILY 60 capsule 2   FARXIGA 10 MG TABS tablet TAKE 1 TABLET BY MOUTH DAILY BEFORE BREAKFAST 90 tablet 0   gabapentin (NEURONTIN) 300 MG capsule Take 300 mg by mouth 2 (two) times daily.     icosapent Ethyl (VASCEPA) 1 g capsule TAKE 2 CAPSULES BY  MOUTH 2 TIMES DAILY 120 capsule 3   levothyroxine (SYNTHROID) 75 MCG tablet TAKE 1 TABLET BY MOUTH DAILY BEFORE BREAKFAST 90 tablet 2   lidocaine-prilocaine (EMLA) cream Apply to affected area once 30 g 3   loperamide (IMODIUM) 2 MG capsule Take 1 capsule (2 mg total) by mouth See admin instructions. Initial: 4 mg,the 2 mg every 2 hours (4 mg every 4 hours at night)  maximum: 16 mg/day 120 capsule 2   loratadine  (CLARITIN) 10 MG tablet Take 10 mg by mouth every morning.     magic mouthwash w/lidocaine SOLN Take 5 mLs by mouth 4 (four) times daily as needed for mouth pain. Sig: Swish/Swallow 5-10 ml four times a day as needed. Dispense 480 ml. 1RF 480 mL 1   ondansetron (ZOFRAN) 8 MG tablet Take 1 tablet (8 mg total) by mouth every 8 (eight) hours as needed for nausea, vomiting or refractory nausea / vomiting. Start on the third day after chemotherapy. 30 tablet 1   paliperidone (INVEGA SUSTENNA) 234 MG/1.5ML SUSY injection Inject 234 mg into the muscle every 30 (thirty) days. On or about the 14th of each month     pantoprazole (PROTONIX) 40 MG tablet TAKE 1 TABLET BY MOUTH DAILY 90 tablet 2   pioglitazone (ACTOS) 15 MG tablet Take 1 tablet (15 mg total) by mouth daily. 90 tablet 1   promethazine (PHENERGAN) 12.5 MG tablet Take 1 tablet (12.5 mg total) by mouth every 6 (six) hours as needed for nausea or vomiting. 30 tablet 0   rosuvastatin (CRESTOR) 40 MG tablet TAKE 1 TABLET BY MOUTH EVERY MORNING 90 tablet 0   sertraline (ZOLOFT) 100 MG tablet Take 200 mg by mouth every morning.     Spacer/Aero-Holding Chambers (AEROCHAMBER MV) inhaler Use as instructed 1 each 0   Vibegron (GEMTESA) 75 MG TABS Take 1 tablet (75 mg total) by mouth daily.     No current facility-administered medications for this visit.   Facility-Administered Medications Ordered in Other Visits  Medication Dose Route Frequency Provider Last Rate Last Admin   0.9 %  sodium chloride infusion   Intravenous Once [name], MD       albuterol (PROVENTIL) (2.5 MG/3ML) 0.083% nebulizer solution 2.5 mg  2.5 mg Nebulization Once Raechel Chute, MD       bevacizumab-awwb (MVASI) 300 mg in sodium chloride 0.9 % 100 mL chemo infusion  5 mg/kg (Treatment Plan Recorded) Intravenous Once [name], MD       dexamethasone (DECADRON) injection 10 mg  10 mg Intravenous Once [name], MD       fluorouracil (ADRUCIL) 3,850 mg in sodium chloride 0.9 % 73 mL  chemo infusion  2,400 mg/m2 (Treatment Plan Recorded) Intravenous 1 day or 1 dose [name], MD       fluorouracil (ADRUCIL) chemo injection 650 mg  400 mg/m2 (Treatment Plan Recorded) Intravenous Once [name], MD       irinotecan (CAMPTOSAR) 300 mg in sodium chloride 0.9 % 500 mL chemo infusion  180 mg/m2 (Treatment Plan Recorded) Intravenous Once [name], MD       leucovorin 644 mg in sodium chloride 0.9 % 250 mL infusion  400 mg/m2 (Treatment Plan Recorded) Intravenous Once [name], MD       palonosetron (ALOXI) injection 0.25 mg  0.25 mg Intravenous Once [name], MD       sodium chloride flush (NS) 0.9 % injection 10 mL  10 mL Intracatheter PRN [name], MD  10 mL at 08/15/23 1327     PHYSICAL EXAMINATION: ECOG PERFORMANCE STATUS: 0 - Asymptomatic Vitals:   03/10/24 0835  BP: 121/88  Pulse: 80  Resp: 18  Temp: (!) 96 F (35.6 C)  SpO2: 95%    Filed Weights   03/10/24 0835  Weight: 133 lb 9.6 oz (60.6 kg)     Physical Exam Constitutional:      General: She is not in acute distress. HENT:     Head: Normocephalic and atraumatic.     Mouth/Throat:     Comments: mucositis Eyes:     General: No scleral icterus. Cardiovascular:     Rate and Rhythm: Normal rate and regular rhythm.  Pulmonary:     Effort: Pulmonary effort is normal. No respiratory distress.     Breath sounds: No wheezing.     Comments: Decreased breath sound bilaterally Abdominal:     General: Bowel sounds are normal. There is no distension.     Palpations: Abdomen is soft.     Comments: +colostomy   Musculoskeletal:        General: No deformity. Normal range of motion.     Cervical back: Normal range of motion and neck supple.  Skin:    General: Skin is warm and dry.     Findings: No erythema or rash.  Neurological:     Mental Status: She is alert and oriented to person, place, and time. Mental status is at baseline.     Cranial Nerves: No cranial nerve deficit.     Coordination: Coordination  normal.  Psychiatric:        Mood and Affect: Mood normal.     LABORATORY DATA:  I have reviewed the data as listed    Latest Ref Rng & Units 03/10/2024    8:21 AM 03/03/2024    8:31 AM 02/18/2024    9:39 AM  CBC  WBC 4.0 - 10.5 K/uL 3.9  6.8  4.2   Hemoglobin 12.0 - 15.0 g/dL 86.5  78.4  69.6   Hematocrit 36.0 - 46.0 % 43.7  39.3  48.3   Platelets 150 - 400 K/uL 157  136  150       Latest Ref Rng & Units 03/10/2024    8:21 AM 03/03/2024    8:31 AM 02/18/2024    9:39 AM  CMP  Glucose 70 - 99 mg/dL 295  284  132   BUN 6 - 20 mg/dL 18  22  18    Creatinine 0.44 - 1.00 mg/dL 4.40  1.02  7.25   Sodium 135 - 145 mmol/L 139  140  136   Potassium 3.5 - 5.1 mmol/L 4.0  4.3  4.0   Chloride 98 - 111 mmol/L 102  104  100   CO2 22 - 32 mmol/L 28  26  24    Calcium 8.9 - 10.3 mg/dL 9.2  9.1  9.2   Total Protein 6.5 - 8.1 g/dL 7.2  6.6  7.6   Total Bilirubin 0.0 - 1.2 mg/dL 0.4  0.5  0.4   Alkaline Phos 38 - 126 U/L 70  66  72   AST 15 - 41 U/L 19  17  26    ALT 0 - 44 U/L 16  15  22          RADIOGRAPHIC STUDIES: I have personally reviewed the radiological images as listed and agreed with the findings in the report. CT CHEST ABDOMEN PELVIS W CONTRAST Result Date: 03/03/2024 CLINICAL DATA:  Rectal cancer follow-up.  * Tracking Code: BO * EXAM: CT CHEST, ABDOMEN, AND PELVIS WITH CONTRAST TECHNIQUE: Multidetector CT imaging of the chest, abdomen and pelvis was performed following the standard protocol during bolus administration of intravenous contrast. RADIATION DOSE REDUCTION: This exam was performed according to the departmental dose-optimization program which includes automated exposure control, adjustment of the mA and/or kV according to patient size and/or use of iterative reconstruction technique. CONTRAST:  OMNIPAQUE IOHEXOL 300 MG/ML SOLN, 30mL OMNIPAQUE IOHEXOL 300 MG/ML SOLN COMPARISON:  11/13/2023 FINDINGS: CT CHEST FINDINGS Cardiovascular: Right Port-A-Cath tip high right  atrium. Aortic atherosclerosis. Normal heart size, without pericardial effusion. Left circumflex coronary artery calcification. No central pulmonary embolism, on this non-dedicated study. Mediastinum/Nodes: No supraclavicular adenopathy. No mediastinal or hilar adenopathy. Lungs/Pleura: No pleural fluid. Subtle cavitary lung nodules are again identified, felt to be minimally decreased. Example left lower lobe nodule of 4 mm on 85/4 versus 5 mm on the prior exam. More lateral left lower lobe 3 mm nodule on 84/4 versus 5 mm previously. Anteromedial right middle lobe 3 mm nodule on 81/4 versus 5 mm on the prior. No new or enlarging nodules. Musculoskeletal: Included within the abdomen pelvic section. CT ABDOMEN PELVIS FINDINGS Hepatobiliary: A posterior right hepatic lobe lesion measures 7 mm on 49/2 versus 8 mm on the prior. No new or enlarging liver lesions. Normal gallbladder, without biliary ductal dilatation. Pancreas: Normal, without mass or ductal dilatation. Spleen: Normal in size, without focal abnormality. Adrenals/Urinary Tract: Normal adrenal glands. Mild to moderate left renal scarring. Left renal calculi of up to 7 mm. Interpolar right renal 4.0 cm simple cyst. Other bilateral renal lesions are too small to characterize but most likely cysts . In the absence of clinically indicated signs/symptoms require(s) no independent follow-up. No hydronephrosis. Normal urinary bladder. Stomach/Bowel: Normal stomach, without wall thickening. Descending colostomy. Large colonic stool burden. Normal terminal ileum and appendix. Normal small bowel. Vascular/Lymphatic: Aortic atherosclerosis. Abdominal retroperitoneal nodes of up to 9 mm are similar including on 63/2. No pelvic sidewall adenopathy. Reproductive: Normal uterus and adnexa. Other: No significant free fluid. Mild pelvic floor laxity. No evidence of omental or peritoneal disease. Musculoskeletal: Presumably injection related subcutaneous nodularity  superficial the gluteals. Similar. No acute osseous abnormality. IMPRESSION: 1. 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 2. No new or progressive disease. 3. Similar prominent but not pathologically sized abdominal retroperitoneal nodes, nonspecific. 4. Status post low anterior resection with descending colostomy. Possible constipation. 5. Age advanced coronary artery atherosclerosis. Recommend assessment of coronary risk factors. 6.  Aortic Atherosclerosis (ICD10-I70.0). 7. Left nephrolithiasis and renal scarring. Electronically Signed   By: Jeronimo Greaves M.D.   On: 03/03/2024 08:50

## 2024-03-10 NOTE — Assessment & Plan Note (Addendum)
 History of Stage III. pT3 pN1b cM0, s/p APR. 05/2023 Stage IV current rectal adenocarcinoma Currently on adjuvant chemotherapy. S/p FOLFOX x 4 cycles S/p concurrent chemotherapy [Xeloda 1300 mg BID] with RT, and then another 4 cycles of adjuvant FOLFOX [dose reduced oxaliplatin and omit 5-FU bolus 05/2023 CT imaging indicates disease progression. liver biopsy pathology showed metastatic carcinoma--> FOLFIRI + Bevacizumab   NGS showed TP53 missense variant, APC frameshift, TCF7L2 Frameshift, FLT3 copy number gain., TMB 4.7, pMMR Wildtype KRAS/NRAS Labs are reviewed and discussed with patient. Proceed with  FOLFIRI + Bevacizumab due to diarrhea.  CT scan showed further treatment response. Continue current regimen.

## 2024-03-10 NOTE — Patient Instructions (Signed)
 [facility] - A DEPT OF [facility]  Discharge Instructions: Thank you for choosing [facility] to provide your oncology and hematology care.  If you have a lab appointment with the Cancer Center, please go directly to the Cancer Center and check in at the registration area.  Wear comfortable clothing and clothing appropriate for easy access to any Portacath or PICC line.   We strive to give you quality time with your provider. You may need to reschedule your appointment if you arrive late (15 or more minutes).  Arriving late affects you and other patients whose appointments are after yours.  Also, if you miss three or more appointments without notifying the office, you may be dismissed from the clinic at the provider's discretion.      For prescription refill requests, have your pharmacy contact our office and allow 72 hours for refills to be completed.    Today you received the following chemotherapy and/or immunotherapy agents Irinotecan, Leucovorin, Mvasi, Adrucil       To help prevent nausea and vomiting after your treatment, we encourage you to take your nausea medication as directed.  BELOW ARE SYMPTOMS THAT SHOULD BE REPORTED IMMEDIATELY: *FEVER GREATER THAN 100.4 F (38 C) OR HIGHER *CHILLS OR SWEATING *NAUSEA AND VOMITING THAT IS NOT CONTROLLED WITH YOUR NAUSEA MEDICATION *UNUSUAL SHORTNESS OF BREATH *UNUSUAL BRUISING OR BLEEDING *URINARY PROBLEMS (pain or burning when urinating, or frequent urination) *BOWEL PROBLEMS (unusual diarrhea, constipation, pain near the anus) TENDERNESS IN MOUTH AND THROAT WITH OR WITHOUT PRESENCE OF ULCERS (sore throat, sores in mouth, or a toothache) UNUSUAL RASH, SWELLING OR PAIN  UNUSUAL VAGINAL DISCHARGE OR ITCHING   Items with * indicate a potential emergency and should be followed up as soon as possible or go to the Emergency Department if any problems should occur.  Please show the CHEMOTHERAPY  ALERT CARD or IMMUNOTHERAPY ALERT CARD at check-in to the Emergency Department and triage nurse.  Should you have questions after your visit or need to cancel or reschedule your appointment, please contact [facility] - A DEPT OF [facility]  [phone] and follow the prompts.  Office hours are 8:00 a.m. to 4:30 p.m. Monday - Friday. Please note that voicemails left after 4:00 p.m. may not be returned until the following business day.  We are closed weekends and major holidays. You have access to a nurse at all times for urgent questions. Please call the main number to the clinic [phone] and follow the prompts.  For any non-urgent questions, you may also contact your provider using MyChart. We now offer e-Visits for anyone 58 and older to request care online for non-urgent symptoms. For details visit [email or web address].   Also download the MyChart app! Go to the app store, search "MyChart", open the app, select [facility], and log in with your MyChart username and password.

## 2024-03-10 NOTE — Progress Notes (Signed)
 With approximately 5 minutes left of treatment, pt stated she felt sick and needed to leave.  Pt informed there was approximately 5 minutes left of treatment.  Treatment paused and atropine given (pt initially stated she had been constipated and did not require atropine).  Pt stated "I just need to get out of here".   Treatment stopped.  60fu push and pump administered.  Pt ambulatory out of clinic.

## 2024-03-10 NOTE — Assessment & Plan Note (Signed)
 Chemotherapy plan as listed above

## 2024-03-10 NOTE — Assessment & Plan Note (Signed)
Recommend Imodium PRN as directed.

## 2024-03-10 NOTE — Assessment & Plan Note (Signed)
 Grade 2  Continue gabapentin 300mg  BID.

## 2024-03-10 NOTE — Assessment & Plan Note (Addendum)
 Recommend patient to use her inhalers and stop smoking

## 2024-03-11 ENCOUNTER — Encounter: Payer: Self-pay | Admitting: Oncology

## 2024-03-11 ENCOUNTER — Encounter: Payer: Self-pay | Admitting: Nurse Practitioner

## 2024-03-11 NOTE — Telephone Encounter (Signed)
 Contacted [facility] pathology to follow up on testing addon. Rep said that 10 unstained slides were sent out to [facility] pathology for the testing addon. Contacted [facility] and they state they could not find any information and [facility] rep would have to call them to give more information.   Called [facility] and was sent to Histology management VM and left message for a return call/ follow up.

## 2024-03-12 ENCOUNTER — Inpatient Hospital Stay

## 2024-03-12 VITALS — BP 106/90 | HR 92 | Resp 18

## 2024-03-12 DIAGNOSIS — C20 Malignant neoplasm of rectum: Secondary | ICD-10-CM

## 2024-03-12 MED ORDER — HEPARIN SOD (PORK) LOCK FLUSH 100 UNIT/ML IV SOLN
500.0000 [IU] | Freq: Once | INTRAVENOUS | Status: AC | PRN
  Administered 2024-03-12: 500 [IU]
  Filled 2024-03-12: qty 5

## 2024-03-13 ENCOUNTER — Other Ambulatory Visit: Payer: Self-pay | Admitting: Family Medicine

## 2024-03-13 DIAGNOSIS — E1169 Type 2 diabetes mellitus with other specified complication: Secondary | ICD-10-CM

## 2024-03-13 MED ORDER — ICOSAPENT ETHYL 0.5 G PO CAPS: 2.0000 | ORAL_CAPSULE | Freq: Two times a day (BID) | ORAL | 1 refills | Status: DC

## 2024-03-14 ENCOUNTER — Other Ambulatory Visit: Payer: Self-pay | Admitting: Family Medicine

## 2024-03-14 ENCOUNTER — Telehealth: Payer: Self-pay | Admitting: Family Medicine

## 2024-03-14 DIAGNOSIS — E1169 Type 2 diabetes mellitus with other specified complication: Secondary | ICD-10-CM

## 2024-03-14 MED ORDER — ICOSAPENT ETHYL 1 G PO CAPS: 2.0000 g | ORAL_CAPSULE | Freq: Two times a day (BID) | ORAL | 2 refills | Status: AC

## 2024-03-14 NOTE — Telephone Encounter (Signed)
 Copied from CRM [id number]. Topic: Clinical - Prescription Issue >> Mar 14, 2024 11:27 AM Higinio Roger wrote: Reason for CRM: Trinna Post from [facility] wanted to advised that a prescription was written for cosapent Ethyl (VASCEPA) 0.5 g CAPS but it only comes in 1g capules  Callback: [phone] Fax: [phone]

## 2024-03-24 ENCOUNTER — Inpatient Hospital Stay (HOSPITAL_BASED_OUTPATIENT_CLINIC_OR_DEPARTMENT_OTHER): Admitting: Oncology

## 2024-03-24 ENCOUNTER — Inpatient Hospital Stay

## 2024-03-24 ENCOUNTER — Encounter: Payer: Self-pay | Admitting: Oncology

## 2024-03-24 VITALS — BP 113/98 | HR 85 | Resp 18 | Wt 129.2 lb

## 2024-03-24 DIAGNOSIS — R109 Unspecified abdominal pain: Secondary | ICD-10-CM | POA: Diagnosis not present

## 2024-03-24 DIAGNOSIS — Z9109 Other allergy status, other than to drugs and biological substances: Secondary | ICD-10-CM | POA: Diagnosis not present

## 2024-03-24 DIAGNOSIS — C20 Malignant neoplasm of rectum: Secondary | ICD-10-CM

## 2024-03-24 DIAGNOSIS — R634 Abnormal weight loss: Secondary | ICD-10-CM

## 2024-03-24 DIAGNOSIS — T451X5A Adverse effect of antineoplastic and immunosuppressive drugs, initial encounter: Secondary | ICD-10-CM

## 2024-03-24 DIAGNOSIS — Z5111 Encounter for antineoplastic chemotherapy: Secondary | ICD-10-CM

## 2024-03-24 DIAGNOSIS — G620 Drug-induced polyneuropathy: Secondary | ICD-10-CM

## 2024-03-24 LAB — PROTEIN, URINE, RANDOM: Total Protein, Urine: 15 mg/dL

## 2024-03-24 LAB — CMP (CANCER CENTER ONLY)
ALT: 14 U/L (ref 0–44)
AST: 19 U/L (ref 15–41)
Albumin: 4 g/dL (ref 3.5–5.0)
Alkaline Phosphatase: 76 U/L (ref 38–126)
Anion gap: 10 (ref 5–15)
BUN: 16 mg/dL (ref 6–20)
CO2: 23 mmol/L (ref 22–32)
Calcium: 9 mg/dL (ref 8.9–10.3)
Chloride: 105 mmol/L (ref 98–111)
Creatinine: 0.89 mg/dL (ref 0.44–1.00)
GFR, Estimated: >60 mL/min (ref >60)
Glucose, Bld: 116 mg/dL — ABNORMAL HIGH (ref 70–99)
Potassium: 3.9 mmol/L (ref 3.5–5.1)
Sodium: 138 mmol/L (ref 135–145)
Total Bilirubin: 0.4 mg/dL (ref 0.0–1.2)
Total Protein: 7 g/dL (ref 6.5–8.1)

## 2024-03-24 LAB — CBC WITH DIFFERENTIAL (CANCER CENTER ONLY)
Abs Immature Granulocytes: 0.01 10*3/uL (ref 0.00–0.07)
Basophils Absolute: 0 10*3/uL (ref 0.0–0.1)
Basophils Relative: 1 %
Eosinophils Absolute: 0.1 10*3/uL (ref 0.0–0.5)
Eosinophils Relative: 2 %
HCT: 43.4 % (ref 36.0–46.0)
Hemoglobin: 14.5 g/dL (ref 12.0–15.0)
Immature Granulocytes: 0 %
Lymphocytes Relative: 18 %
Lymphs Abs: 0.7 10*3/uL (ref 0.7–4.0)
MCH: 33.6 pg (ref 26.0–34.0)
MCHC: 33.4 g/dL (ref 30.0–36.0)
MCV: 100.7 fL — ABNORMAL HIGH (ref 80.0–100.0)
Monocytes Absolute: 0.4 10*3/uL (ref 0.1–1.0)
Monocytes Relative: 11 %
Neutro Abs: 2.8 10*3/uL (ref 1.7–7.7)
Neutrophils Relative %: 68 %
Platelet Count: 153 10*3/uL (ref 150–400)
RBC: 4.31 MIL/uL (ref 3.87–5.11)
RDW: 14.8 % (ref 11.5–15.5)
WBC Count: 4 10*3/uL (ref 4.0–10.5)
nRBC: 0 % (ref 0.0–0.2)

## 2024-03-24 MED ORDER — HEPARIN SOD (PORK) LOCK FLUSH 100 UNIT/ML IV SOLN
500.0000 [IU] | Freq: Once | INTRAVENOUS | Status: DC | PRN
  Filled 2024-03-24: qty 5

## 2024-03-24 MED ORDER — SODIUM CHLORIDE 0.9 % IV SOLN
180.0000 mg/m2 | Freq: Once | INTRAVENOUS | Status: AC
  Administered 2024-03-24: 300 mg via INTRAVENOUS
  Filled 2024-03-24: qty 15

## 2024-03-24 MED ORDER — DEXAMETHASONE SODIUM PHOSPHATE 10 MG/ML IJ SOLN
10.0000 mg | Freq: Once | INTRAMUSCULAR | Status: AC
  Administered 2024-03-24: 10 mg via INTRAVENOUS
  Filled 2024-03-24: qty 1

## 2024-03-24 MED ORDER — PALONOSETRON HCL INJECTION 0.25 MG/5ML
0.2500 mg | Freq: Once | INTRAVENOUS | Status: AC
  Administered 2024-03-24: 0.25 mg via INTRAVENOUS
  Filled 2024-03-24: qty 5

## 2024-03-24 MED ORDER — SODIUM CHLORIDE 0.9 % IV SOLN
2400.0000 mg/m2 | INTRAVENOUS | Status: DC
  Administered 2024-03-24: 3850 mg via INTRAVENOUS
  Filled 2024-03-24: qty 77

## 2024-03-24 MED ORDER — SODIUM CHLORIDE 0.9 % IV SOLN
Freq: Once | INTRAVENOUS | Status: AC
  Filled 2024-03-24: qty 250

## 2024-03-24 MED ORDER — SODIUM CHLORIDE 0.9 % IV SOLN
400.0000 mg/m2 | Freq: Once | INTRAVENOUS | Status: AC
  Administered 2024-03-24: 644 mg via INTRAVENOUS
  Filled 2024-03-24: qty 32.2

## 2024-03-24 MED ORDER — FLUOROURACIL CHEMO INJECTION 2.5 GM/50ML
400.0000 mg/m2 | Freq: Once | INTRAVENOUS | Status: AC
  Administered 2024-03-24: 650 mg via INTRAVENOUS
  Filled 2024-03-24: qty 13

## 2024-03-24 MED ORDER — ATROPINE SULFATE 1 MG/ML IV SOLN
0.5000 mg | Freq: Once | INTRAVENOUS | Status: AC
  Administered 2024-03-24: 0.5 mg via INTRAVENOUS
  Filled 2024-03-24: qty 1

## 2024-03-24 MED ORDER — SODIUM CHLORIDE 0.9 % IV SOLN
5.0000 mg/kg | Freq: Once | INTRAVENOUS | Status: AC
  Administered 2024-03-24: 300 mg via INTRAVENOUS
  Filled 2024-03-24: qty 12

## 2024-03-24 NOTE — Assessment & Plan Note (Signed)
 Irinotecan side effects during chemotherapy  Will move post atropine to be premeds.

## 2024-03-24 NOTE — Assessment & Plan Note (Signed)
 Chemotherapy plan as listed above

## 2024-03-24 NOTE — Assessment & Plan Note (Signed)
Recommend claritin 10mg  daily

## 2024-03-24 NOTE — Assessment & Plan Note (Signed)
Nutritionist followup.

## 2024-03-24 NOTE — Progress Notes (Signed)
 Patient here today for follow up regarding rectal cancer. Patient reports sinus congestion, scratchy throat. Patient concerned she has swollen lymph node on left side of neck. Patient reports severe nausea and cramping during chemotherapy treatment. Patient reports decreased appetite.

## 2024-03-24 NOTE — Assessment & Plan Note (Signed)
 Grade 2  Continue gabapentin 300mg  BID.

## 2024-03-24 NOTE — Assessment & Plan Note (Addendum)
 History of Stage III. pT3 pN1b cM0, s/p APR. 05/2023 Stage IV current rectal adenocarcinoma Currently on adjuvant chemotherapy. S/p FOLFOX x 4 cycles S/p concurrent chemotherapy [Xeloda 1300 mg BID] with RT, and then another 4 cycles of adjuvant FOLFOX [dose reduced oxaliplatin and omit 5-FU bolus 05/2023 CT imaging indicates disease progression. liver biopsy pathology showed metastatic carcinoma--> FOLFIRI + Bevacizumab   NGS showed TP53 missense variant, APC frameshift, TCF7L2 Frameshift, FLT3 copy number gain., TMB 4.7, pMMR Wildtype KRAS/NRAS Labs are reviewed and discussed with patient. Proceed with  FOLFIRI + Bevacizumab  CT scan showed further treatment response. Continue current regimen.

## 2024-03-24 NOTE — Patient Instructions (Signed)
 [facility] - A DEPT OF [facility]  Discharge Instructions: Thank you for choosing [facility] to provide your oncology and hematology care.  If you have a lab appointment with the Cancer Center, please go directly to the Cancer Center and check in at the registration area.  Wear comfortable clothing and clothing appropriate for easy access to any Portacath or PICC line.   We strive to give you quality time with your provider. You may need to reschedule your appointment if you arrive late (15 or more minutes).  Arriving late affects you and other patients whose appointments are after yours.  Also, if you miss three or more appointments without notifying the office, you may be dismissed from the clinic at the provider's discretion.      For prescription refill requests, have your pharmacy contact our office and allow 72 hours for refills to be completed.    Today you received the following chemotherapy and/or immunotherapy agents Adrucil, Leucovorin and Irinotecan.      To help prevent nausea and vomiting after your treatment, we encourage you to take your nausea medication as directed.  BELOW ARE SYMPTOMS THAT SHOULD BE REPORTED IMMEDIATELY: *FEVER GREATER THAN 100.4 F (38 C) OR HIGHER *CHILLS OR SWEATING *NAUSEA AND VOMITING THAT IS NOT CONTROLLED WITH YOUR NAUSEA MEDICATION *UNUSUAL SHORTNESS OF BREATH *UNUSUAL BRUISING OR BLEEDING *URINARY PROBLEMS (pain or burning when urinating, or frequent urination) *BOWEL PROBLEMS (unusual diarrhea, constipation, pain near the anus) TENDERNESS IN MOUTH AND THROAT WITH OR WITHOUT PRESENCE OF ULCERS (sore throat, sores in mouth, or a toothache) UNUSUAL RASH, SWELLING OR PAIN  UNUSUAL VAGINAL DISCHARGE OR ITCHING   Items with * indicate a potential emergency and should be followed up as soon as possible or go to the Emergency Department if any problems should occur.  Please show the CHEMOTHERAPY  ALERT CARD or IMMUNOTHERAPY ALERT CARD at check-in to the Emergency Department and triage nurse.  Should you have questions after your visit or need to cancel or reschedule your appointment, please contact [facility] - A DEPT OF [facility]  [phone] and follow the prompts.  Office hours are 8:00 a.m. to 4:30 p.m. Monday - Friday. Please note that voicemails left after 4:00 p.m. may not be returned until the following business day.  We are closed weekends and major holidays. You have access to a nurse at all times for urgent questions. Please call the main number to the clinic [phone] and follow the prompts.  For any non-urgent questions, you may also contact your provider using MyChart. We now offer e-Visits for anyone 61 and older to request care online for non-urgent symptoms. For details visit [email or web address].   Also download the MyChart app! Go to the app store, search "MyChart", open the app, select , and log in with your MyChart username and password.

## 2024-03-24 NOTE — Progress Notes (Signed)
 Hematology/Oncology Progress note [phone] [phone]      CHIEF COMPLAINTS/REASON FOR VISIT:  Follow-up for Stage IV  rectal cancer treatments.  ASSESSMENT & PLAN:   Cancer Staging  Rectal cancer [facility]) Staging form: Colon and Rectum, AJCC 8th Edition - Pathologic stage from 05/03/2022: Stage IIIB (pT3, pN1b, cM0) - Signed by [name], MD on 05/03/2022 - Pathologic stage from 06/18/2023: Stage IVA (rpTX, pN0, pM1a) - Signed by [name], MD on 06/18/2023   Rectal cancer [facility]) History of Stage III. pT3 pN1b cM0, s/p APR. 05/2023 Stage IV current rectal adenocarcinoma Currently on adjuvant chemotherapy. S/p FOLFOX x 4 cycles S/p concurrent chemotherapy [Xeloda 1300 mg BID] with RT, and then another 4 cycles of adjuvant FOLFOX [dose reduced oxaliplatin and omit 5-FU bolus 05/2023 CT imaging indicates disease progression. liver biopsy pathology showed metastatic carcinoma--> FOLFIRI + Bevacizumab   NGS showed TP53 missense variant, APC frameshift, TCF7L2 Frameshift, FLT3 copy number gain., TMB 4.7, pMMR Wildtype KRAS/NRAS Labs are reviewed and discussed with patient. Proceed with  FOLFIRI + Bevacizumab  CT scan showed further treatment response. Continue current regimen.    Chemotherapy-induced neuropathy (HCC) Grade 2  Continue gabapentin 300mg  BID.   Encounter for antineoplastic chemotherapy Chemotherapy plan as listed above  Pollen allergy Recommend claritin 10mg  daily.   Abdominal cramping Irinotecan side effects during chemotherapy  Will move post atropine to be premeds.   Weight loss Nutritionist follow up     No orders of the defined types were placed in this encounter.   Follow-up 2 week(s)  All questions were answered. The patient knows to call the clinic with any problems, questions or concerns.  [name], MD, PhD [facility] Hematology Oncology 03/24/2024      HISTORY OF PRESENTING ILLNESS:   April Luna is a  53 y.o.   female presents for treatment of rectal cancer Oncology History  Rectal cancer (HCC)  02/26/2022 Imaging   MRI PELVIS WITHOUT CONTRAST- By imaging, rectal cancer stage:  T1/T2, N0, Mx    03/02/2022 Imaging   CT CHEST AND ABDOMEN WITH CONTRAST 1. No convincing evidence of metastatic disease within the chest or abdomen. 2. Atrophic left kidney with multifocal renal scarring and cortical calcifications as well as nonobstructive renal stones measuring up to 5 mm. 3. Prominent left-sided predominant retroperitoneal lymph nodes measuring up to 8 mm near the level of the renal hilum, overall decreased in size dating back to CT September 19, 2018 and favored reactive related to left renal inflammation. 4.  Aortic Atherosclerosis (ICD10-I70.0).   03/27/2022 Genetic Testing    [facility] Custom[employer]+RNA cancer panel found no pathogenic mutations.    04/21/2022 Initial Diagnosis   Rectal cancer - baseline CEA 3.6 -02/09/2022, patient had colonoscopy which showed renal mass 10 cm from anal verge.  5 mm polyp in ascending colon.  Removed and retrieved. Pathology showed rectal adenocarcinoma.  The polyp in the ascending colon is a tubular adenoma.  MRI showed cT1/T2N0 disease  -04/21/2022, patient underwent robotic assisted ultralow anterior resection. Pathology showed moderately differentiated adenocarcinoma, 4.5 cm in maximal extent, with focal extension through muscularis propria into perirectal soft tissue.  3 lymph nodes positive for metastatic carcinoma.  Negative margin.  pT3 pN1b, MSI stable.   05/03/2022 Cancer Staging   Staging form: Colon and Rectum, AJCC 8th Edition - Pathologic stage from 05/03/2022: Stage IIIB (pT3, pN1b, cM0) - Signed by [name], MD on 05/03/2022 Stage prefix: Initial diagnosis   05/11/2022 Miscellaneous   Medi  port placed by Dr.White   05/26/2022 -  Chemotherapy   FOLFOX Q2 weeks x 4   05/26/2022 - 07/09/2022 Chemotherapy   Patient is on Treatment Plan : COLORECTAL FOLFOX q14d x 8  cycles     05/26/2022 - 12/13/2022 Chemotherapy   Patient is on Treatment Plan : COLORECTAL FOLFOX q14d x 4 months     07/27/2022 -  Chemotherapy   Concurrent chemotherapy- Xeloda  1300mg  BID and radiation.    07/27/2022 - 09/12/2022 Radiation Therapy    concurrent chemotherapy [Xeloda 1300 mg BID] with RT    09/20/2022 Imaging   CT Angiogram chest PET protocol There is no evidence of pulmonary artery embolism. There is no evidence of thoracic aortic dissection.   Small linear patchy alveolar infiltrate is seen in medial segment of right middle lobe suggesting atelectasis/pneumonia.     09/28/2022 - 09/30/2022 Hospital Admission   Admission due to acute respiratory failure with hypoxia, COPD exacerbation   09/28/2022 Imaging   CT angio chest PE protocol 1. Negative for acute PE or thoracic aortic dissection. 2. New ground-glass infiltrates anteriorly in bilateral upper lobes,may represent atypical edema, infectious or inflammatory process. 3.  Aortic Atherosclerosis     12/22/2022 Imaging   CT chest abdomen pelvis w contrast  Stable exam. No evidence of recurrent or metastatic carcinoma within the chest, abdomen, or pelvis.   Left nephrolithiasis and renal parenchymal atrophy. No evidence of ureteral calculi or hydronephrosis.   Aortic Atherosclerosis   05/31/2023 Imaging   CT chest abdomen pelvis with contrast showed 1. New hypodense 16 mm lesion in the right lobe of the liver with very subtle adjacent 5 mm lesion, suspicious for metastatic disease. 2. Small bilateral pulmonary nodules, some of which were subtly evident on prior examination only in retrospect, some of which demonstrate degree of cavitation, suspicious for metastatic disease. 3. Prominent retroperitoneal lymph nodes are similar prior. 4. Prior low anterior resection with Hartmann's pouch formation,similar small amount of perirectal/presacral soft tissue and fluid,favored postsurgical/posttreatment change. Suggest  continued attention on follow-up imaging. 5. Large volume of formed stool in the colon. 6. Nonobstructive left renal calculi measure under 1 cm.     06/08/2023 Relapse/Recurrence   Ultrasound-guided liver biopsy showed Pathology showed metastatic carcinoma, morphology consistent with patient's clinical history of rectal adenocarcinoma.  [employer] NGS 648 gene panel showed TP53 missense variant, APC frameshift, TCF7L2 frameshift, FLT3 copy number gain, TMB 4.60m/MB MSI stable.  Wildtype KRAS/NRAS  [facility] RNA Seq - ERBB3 overexpressed, VEGFA overexpressed, NRAS overexpressed.     06/18/2023 Cancer Staging   Staging form: Colon and Rectum, AJCC 8th Edition - Pathologic stage from 06/18/2023: Stage IVA (rpTX, pN0, pM1a) - Signed by [name], MD on 06/18/2023 Stage prefix: Recurrence Total positive nodes: 0   06/25/2023 - 06/25/2023 Chemotherapy   Patient is on Treatment Plan : COLORECTAL FOLFOX + Bevacizumab q14d     07/02/2023 -  Chemotherapy   Patient is on Treatment Plan : COLORECTAL FOLFIRI + Bevacizumab q14d     11/13/2023 Imaging   CT chest abdomen pelvis w contrast showed  1. Increase in cavitation with decreased soft tissue involving the scattered bilateral pulmonary nodules, consistent with treatment response. No new suspicious pulmonary nodules or masses. 2. Decrease in size and conspicuity of the hypodense lesions in the right lobe of the liver, consistent with treatment response. No new suspicious hepatic lesion identified. 3. Stable prominent nonspecific retroperitoneal lymph nodes. Suggest continued attention on follow-up imaging. 4. Similar surgical changes of prior  low anterior resection with left anterior abdominal wall colostomy. Similar mesorectal/presacral soft tissue, no new suspicious enhancing nodularity. 5. Similar soft tissue nodularity in the bilateral posterior gluteal subcutaneous soft tissues. 6. Moderate volume of formed stool in the colon. Correlate  for constipation.   03/03/2024 Imaging   CT chest abdomen pelvis w contrast showed 1. 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 2. No new or progressive disease. 3. Similar prominent but not pathologically sized abdominal retroperitoneal nodes, nonspecific. 4. Status post low anterior resection with descending colostomy. Possible constipation. 5. Age advanced coronary artery atherosclerosis. Recommend assessment of coronary risk factors. 6.  Aortic Atherosclerosis (ICD10-I70.0). 7. Left nephrolithiasis and renal scarring.    Patient has bipolar and schizophrenia..  Patient is married  She has housing issue due to previous history of eviction.  she does not live with her husband currently. She lives with some family members.   + chronic dysuria and urgency she was seen by Urology and was started on [place].  Symptoms are improved.    INTERVAL HISTORY April Luna is a 53 y.o. female who has above history reviewed by me today presents for follow up visit for rectal cancer.   Denies any melena or blood in the stool. Stable neuropathy symptoms. Patient reports sinus congestion, scratchy throat. Patient concerned she has swollen lymph node on left side of neck. Patient reports severe nausea and cramping during chemotherapy treatment.  +decreased appetite.   Review of Systems  Constitutional:  Positive for fatigue. Negative for appetite change, chills and fever.  HENT:   Negative for hearing loss, mouth sores and voice change.        Sinus congestion, scratchy throat.   Eyes:  Negative for eye problems.  Respiratory:  Negative for chest tightness, cough and shortness of breath.   Cardiovascular:  Negative for chest pain.  Gastrointestinal:  Negative for abdominal distention, abdominal pain, blood in stool, diarrhea and nausea.       + colostomy Cramps during chemotherapy  Endocrine: Negative for hot flashes.  Genitourinary:  Negative for difficulty  urinating, dysuria and frequency.   Musculoskeletal:  Negative for arthralgias.  Skin:  Negative for itching and rash.  Neurological:  Positive for numbness. Negative for extremity weakness.  Hematological:  Negative for adenopathy.  Psychiatric/Behavioral:  Negative for confusion.     MEDICAL HISTORY:  Past Medical History:  Diagnosis Date   Allergy    pollen   Anxiety    Arthritis    right hip   Bipolar 1 disorder (HCC)    Cancer (HCC)    rectal   Chemotherapy-induced neuropathy (HCC) 10/24/2022   Chronic kidney disease    COPD (chronic obstructive pulmonary disease) (HCC)    Depression    Family history of adverse reaction to anesthesia    grand father had a stroke during anesthesia   Family history of breast cancer    Family history of colon cancer    Family history of uterine cancer    GERD (gastroesophageal reflux disease)    History of kidney stones    Hyperlipidemia    Hypertension    Hypothyroidism    Panic attack    Pneumonia    Psoriasis    Sleep apnea 08/11/2021   No CPAP   Type 2 diabetes mellitus with microalbuminuria, without long-term current use of insulin (HCC) 06/24/2019    SURGICAL HISTORY: Past Surgical History:  Procedure Laterality Date   BREAST BIOPSY Left 12/14/2021   [place] bx,  venus marker, path pending   CESAREAN SECTION     COLONOSCOPY WITH PROPOFOL N/A 02/09/2022   Procedure: COLONOSCOPY WITH PROPOFOL;  Surgeon: Toney Reil, MD;  Location: [facility];  Service: Endoscopy;  Laterality: N/A;  sleep apnea   CYSTOSCOPY W/ RETROGRADES Left 11/08/2018   Procedure: CYSTOSCOPY WITH RETROGRADE PYELOGRAM;  Surgeon: Sondra Come, MD;  Location: [facility];  Service: Urology;  Laterality: Left;   CYSTOSCOPY/URETEROSCOPY/HOLMIUM LASER/STENT PLACEMENT Left 11/08/2018   Procedure: CYSTOSCOPY/URETEROSCOPY/HOLMIUM LASER/STENT PLACEMENT;  Surgeon: Sondra Come, MD;  Location: [facility];  Service: Urology;  Laterality: Left;   IR CV  LINE INJECTION  06/25/2023   IR IMAGING GUIDED PORT INSERTION  05/11/2022   IR REMOVE CV FIBRIN SHEATH  06/27/2023   MOUTH SURGERY     wisdom teeth extraction   MOUTH SURGERY     teeth removal   POLYPECTOMY N/A 02/09/2022   Procedure: POLYPECTOMY;  Surgeon: Toney Reil, MD;  Location: [facility];  Service: Endoscopy;  Laterality: N/A;   XI ROBOTIC ASSISTED LOWER ANTERIOR RESECTION N/A 04/21/2022   Procedure: XI ROBOTIC ASSISTED LOWER ANTERIOR RESECTION WITH COLOSTOMY, BILATERAL TAP BLOCK, ASSESSMENT OF TISSUE PERFUSSION WITH FIREFLY INJECTION;  Surgeon: Andria Meuse, MD;  Location: [facility];  Service: General;  Laterality: N/A;    SOCIAL HISTORY: Social History   Socioeconomic History   Marital status: Married    Spouse name: Lesle Chris   Number of children: 1   Years of education: 12   Highest education level: High school graduate  Occupational History   Occupation: unemployed    Comment: disabled  Tobacco Use   Smoking status: Some Days    Current packs/day: 0.25    Average packs/day: 0.3 packs/day for 38.9 years (9.7 ttl pk-yrs)    Types: Cigarettes    Start date: 05/13/1985    Passive exposure: Current   Smokeless tobacco: Never   Tobacco comments:    5-6 cigarettes weekly- 11/07/2022  Vaping Use   Vaping status: Former   Start date: 05/25/2018   Quit date: 09/24/2018  Substance and Sexual Activity   Alcohol use: Not Currently    Alcohol/week: 4.0 standard drinks of alcohol    Types: 4 Glasses of wine per week    Comment: quit ETOH in Feb. 2023   Drug use: Yes    Types: Marijuana    Comment: 2 days ago   Sexual activity: Not Currently    Birth control/protection: Post-menopausal  Other Topics Concern   Not on file  Social History Narrative   Lives with husband and son    Social Drivers of Health   Financial Resource Strain: High Risk (08/30/2023)   Overall Financial Resource Strain (CARDIA)    Difficulty of Paying Living Expenses: Hard   Food Insecurity: Food Insecurity Present (08/30/2023)   Hunger Vital Sign    Worried About Running Out of Food in the Last Year: Sometimes true    Ran Out of Food in the Last Year: Sometimes true  Transportation Needs: No Transportation Needs (08/30/2023)   PRAPARE - Administrator, Civil Service (Medical): No    Lack of Transportation (Non-Medical): No  Physical Activity: Inactive (08/30/2023)   Exercise Vital Sign    Days of Exercise per Week: 0 days    Minutes of Exercise per Session: 0 min  Stress: Stress Concern Present (08/30/2023)   [employer] of Occupational Health - Occupational Stress Questionnaire    Feeling of Stress :  Very much  Social Connections: Moderately Integrated (08/30/2023)   Social Connection and Isolation Panel [NHANES]    Frequency of Communication with Friends and Family: Never    Frequency of Social Gatherings with Friends and Family: Once a week    Attends Religious Services: More than 4 times per year    Active Member of [employer] or Organizations: No    Attends Engineer, structural: 1 to 4 times per year    Marital Status: Married  Catering manager Violence: Not At Risk (08/30/2023)   Humiliation, Afraid, Rape, and Kick questionnaire    Fear of Current or Ex-Partner: No    Emotionally Abused: No    Physically Abused: No    Sexually Abused: No    FAMILY HISTORY: Family History  Problem Relation Age of Onset   Depression Mother    Anxiety disorder Mother    Diabetes Mother    Hypertension Mother    Hyperlipidemia Mother    Cancer Mother    Uterine cancer Mother 51   Cervical cancer Mother 53   Colon cancer Father    Depression Brother    Anxiety disorder Brother    Cancer Maternal Aunt        unk types   Diabetes Mellitus II Maternal Grandmother    Hypercholesterolemia Maternal Grandmother    Breast cancer Maternal Grandmother    Cancer Paternal Grandmother    Diabetes Paternal Grandmother    Melanoma Paternal Grandmother     Stomach cancer Paternal Grandmother     ALLERGIES:  is allergic to metformin and related, nsaids, perphenazine, sulfa antibiotics, abilify [aripiprazole], and penicillins.  MEDICATIONS:  Current Outpatient Medications  Medication Sig Dispense Refill   acetaminophen (TYLENOL) 325 MG tablet Take 650 mg by mouth every 6 (six) hours as needed for headache (pain).     albuterol (VENTOLIN HFA) 108 (90 Base) MCG/ACT inhaler Inhale 2 puffs into the lungs every 6 (six) hours as needed for wheezing or shortness of breath. 1 each 5   amantadine (SYMMETREL) 100 MG capsule Take 100 mg by mouth 2 (two) times daily.     blood glucose meter kit and supplies Dispense based on patient and insurance preference. Use up to four times daily as directed. (FOR ICD-10 E10.9, E11.9). 1 each 0   Budeson-Glycopyrrol-Formoterol (BREZTRI AEROSPHERE) 160-9-4.8 MCG/ACT AERO Inhale 2 puffs into the lungs in the morning and at bedtime. 10.7 g 2   buPROPion (WELLBUTRIN XL) 300 MG 24 hr tablet Take 300 mg by mouth daily.     Cholecalciferol (VITAMIN D-3) 125 MCG (5000 UT) TABS Take 5,000 Units by mouth daily. 30 tablet 1   clonazePAM (KLONOPIN) 0.5 MG tablet Take 1 mg by mouth 2 (two) times daily as needed.     dexamethasone (DECADRON) 4 MG tablet Take 2 tablets (8 mg total) by mouth See admin instructions. Take 8mg  daily for 2 days after each chemotherapy. 30 tablet 1   diltiazem (CARDIZEM CD) 120 MG 24 hr capsule TAKE 1 CAPSULE BY MOUTH DAILY 90 capsule 1   docusate sodium (COLACE) 100 MG capsule TAKE 1 CAPSULE BY MOUTH TWICE DAILY 60 capsule 2   FARXIGA 10 MG TABS tablet TAKE 1 TABLET BY MOUTH DAILY BEFORE BREAKFAST 90 tablet 0   gabapentin (NEURONTIN) 300 MG capsule Take 300 mg by mouth 2 (two) times daily.     icosapent Ethyl (VASCEPA) 1 g capsule Take 2 capsules (2 g total) by mouth in the morning and at bedtime. 120 capsule 2  levothyroxine (SYNTHROID) 75 MCG tablet TAKE 1 TABLET BY MOUTH DAILY BEFORE BREAKFAST 90  tablet 2   lidocaine-prilocaine (EMLA) cream Apply to affected area once 30 g 3   loperamide (IMODIUM) 2 MG capsule Take 1 capsule (2 mg total) by mouth See admin instructions. Initial: 4 mg,the 2 mg every 2 hours (4 mg every 4 hours at night)  maximum: 16 mg/day 120 capsule 2   loratadine (CLARITIN) 10 MG tablet Take 10 mg by mouth every morning.     magic mouthwash w/lidocaine SOLN Take 5 mLs by mouth 4 (four) times daily as needed for mouth pain. Sig: Swish/Swallow 5-10 ml four times a day as needed. Dispense 480 ml. 1RF 480 mL 1   ondansetron (ZOFRAN) 8 MG tablet Take 1 tablet (8 mg total) by mouth every 8 (eight) hours as needed for nausea, vomiting or refractory nausea / vomiting. Start on the third day after chemotherapy. 30 tablet 1   paliperidone (INVEGA SUSTENNA) 234 MG/1.5ML SUSY injection Inject 234 mg into the muscle every 30 (thirty) days. On or about the 14th of each month     pantoprazole (PROTONIX) 40 MG tablet TAKE 1 TABLET BY MOUTH DAILY 90 tablet 2   pioglitazone (ACTOS) 15 MG tablet Take 1 tablet (15 mg total) by mouth daily. 90 tablet 1   promethazine (PHENERGAN) 12.5 MG tablet Take 1 tablet (12.5 mg total) by mouth every 6 (six) hours as needed for nausea or vomiting. 30 tablet 0   rosuvastatin (CRESTOR) 40 MG tablet TAKE 1 TABLET BY MOUTH EVERY MORNING 90 tablet 0   sertraline (ZOLOFT) 100 MG tablet Take 200 mg by mouth every morning.     Spacer/Aero-Holding Chambers (AEROCHAMBER MV) inhaler Use as instructed 1 each 0   Vibegron (GEMTESA) 75 MG TABS Take 1 tablet (75 mg total) by mouth daily.     No current facility-administered medications for this visit.   Facility-Administered Medications Ordered in Other Visits  Medication Dose Route Frequency Provider Last Rate Last Admin   albuterol (PROVENTIL) (2.5 MG/3ML) 0.083% nebulizer solution 2.5 mg  2.5 mg Nebulization Once Raechel Chute, MD       bevacizumab-awwb (MVASI) 300 mg in sodium chloride 0.9 % 100 mL chemo infusion   5 mg/kg (Treatment Plan Recorded) Intravenous Once [name], MD       fluorouracil (ADRUCIL) 3,850 mg in sodium chloride 0.9 % 73 mL chemo infusion  2,400 mg/m2 (Treatment Plan Recorded) Intravenous 1 day or 1 dose [name], MD       fluorouracil (ADRUCIL) chemo injection 650 mg  400 mg/m2 (Treatment Plan Recorded) Intravenous Once [name], MD       heparin lock flush 100 unit/mL  500 Units Intracatheter Once PRN [name], MD       irinotecan (CAMPTOSAR) 300 mg in sodium chloride 0.9 % 500 mL chemo infusion  180 mg/m2 (Treatment Plan Recorded) Intravenous Once [name], MD       leucovorin 644 mg in sodium chloride 0.9 % 250 mL infusion  400 mg/m2 (Treatment Plan Recorded) Intravenous Once [name], MD       sodium chloride flush (NS) 0.9 % injection 10 mL  10 mL Intracatheter PRN [name], MD   10 mL at 08/15/23 1327     PHYSICAL EXAMINATION: ECOG PERFORMANCE STATUS: 0 - Asymptomatic Vitals:   03/24/24 0910  BP: (!) 113/98  Pulse: 85  Resp: 18  SpO2: 97%    Filed Weights   03/24/24 0910  Weight: 129 lb 3.2 oz (58.6 kg)     Physical Exam Constitutional:      General: She is not in acute distress. HENT:     Head: Normocephalic and atraumatic.     Mouth/Throat:     Comments: mucositis Eyes:     General: No scleral icterus. Cardiovascular:     Rate and Rhythm: Normal rate and regular rhythm.  Pulmonary:     Effort: Pulmonary effort is normal. No respiratory distress.     Breath sounds: No wheezing.     Comments: Decreased breath sound bilaterally Abdominal:     General: Bowel sounds are normal. There is no distension.     Palpations: Abdomen is soft.     Comments: +colostomy   Musculoskeletal:        General: No deformity. Normal range of motion.     Cervical back: Normal range of motion and neck supple.  Skin:    General: Skin is warm and dry.     Findings: No erythema or rash.  Neurological:     Mental Status: She is alert and oriented to person, place, and time.  Mental status is at baseline.     Cranial Nerves: No cranial nerve deficit.     Coordination: Coordination normal.  Psychiatric:        Mood and Affect: Mood normal.     LABORATORY DATA:  I have reviewed the data as listed    Latest Ref Rng & Units 03/24/2024    8:40 AM 03/10/2024    8:21 AM 03/03/2024    8:31 AM  CBC  WBC 4.0 - 10.5 K/uL 4.0  3.9  6.8   Hemoglobin 12.0 - 15.0 g/dL 95.6  38.7  56.4   Hematocrit 36.0 - 46.0 % 43.4  43.7  39.3   Platelets 150 - 400 K/uL 153  157  136       Latest Ref Rng & Units 03/24/2024    8:40 AM 03/10/2024    8:21 AM 03/03/2024    8:31 AM  CMP  Glucose 70 - 99 mg/dL 332  951  884   BUN 6 - 20 mg/dL 16  18  22    Creatinine 0.44 - 1.00 mg/dL 1.66  0.63  0.16   Sodium 135 - 145 mmol/L 138  139  140   Potassium 3.5 - 5.1 mmol/L 3.9  4.0  4.3   Chloride 98 - 111 mmol/L 105  102  104   CO2 22 - 32 mmol/L 23  28  26    Calcium 8.9 - 10.3 mg/dL 9.0  9.2  9.1   Total Protein 6.5 - 8.1 g/dL 7.0  7.2  6.6   Total Bilirubin 0.0 - 1.2 mg/dL 0.4  0.4  0.5   Alkaline Phos 38 - 126 U/L 76  70  66   AST 15 - 41 U/L 19  19  17    ALT 0 - 44 U/L 14  16  15          RADIOGRAPHIC STUDIES: I have personally reviewed the radiological images as listed and agreed with the findings in the report. CT CHEST ABDOMEN PELVIS W CONTRAST Result Date: 03/03/2024 CLINICAL DATA:  Rectal cancer follow-up.  * Tracking Code: BO * EXAM: CT CHEST, ABDOMEN, AND PELVIS WITH CONTRAST TECHNIQUE: Multidetector CT imaging of the chest, abdomen and pelvis was performed following the standard protocol during bolus administration of intravenous contrast. RADIATION DOSE REDUCTION: This exam was performed according to the departmental dose-optimization  program which includes automated exposure control, adjustment of the mA and/or kV according to patient size and/or use of iterative reconstruction technique. CONTRAST:  OMNIPAQUE IOHEXOL 300 MG/ML SOLN, 30mL OMNIPAQUE IOHEXOL 300 MG/ML  SOLN COMPARISON:  11/13/2023 FINDINGS: CT CHEST FINDINGS Cardiovascular: Right Port-A-Cath tip high right atrium. Aortic atherosclerosis. Normal heart size, without pericardial effusion. Left circumflex coronary artery calcification. No central pulmonary embolism, on this non-dedicated study. Mediastinum/Nodes: No supraclavicular adenopathy. No mediastinal or hilar adenopathy. Lungs/Pleura: No pleural fluid. Subtle cavitary lung nodules are again identified, felt to be minimally decreased. Example left lower lobe nodule of 4 mm on 85/4 versus 5 mm on the prior exam. More lateral left lower lobe 3 mm nodule on 84/4 versus 5 mm previously. Anteromedial right middle lobe 3 mm nodule on 81/4 versus 5 mm on the prior. No new or enlarging nodules. Musculoskeletal: Included within the abdomen pelvic section. CT ABDOMEN PELVIS FINDINGS Hepatobiliary: A posterior right hepatic lobe lesion measures 7 mm on 49/2 versus 8 mm on the prior. No new or enlarging liver lesions. Normal gallbladder, without biliary ductal dilatation. Pancreas: Normal, without mass or ductal dilatation. Spleen: Normal in size, without focal abnormality. Adrenals/Urinary Tract: Normal adrenal glands. Mild to moderate left renal scarring. Left renal calculi of up to 7 mm. Interpolar right renal 4.0 cm simple cyst. Other bilateral renal lesions are too small to characterize but most likely cysts . In the absence of clinically indicated signs/symptoms require(s) no independent follow-up. No hydronephrosis. Normal urinary bladder. Stomach/Bowel: Normal stomach, without wall thickening. Descending colostomy. Large colonic stool burden. Normal terminal ileum and appendix. Normal small bowel. Vascular/Lymphatic: Aortic atherosclerosis. Abdominal retroperitoneal nodes of up to 9 mm are similar including on 63/2. No pelvic sidewall adenopathy. Reproductive: Normal uterus and adnexa. Other: No significant free fluid. Mild pelvic floor laxity. No evidence of  omental or peritoneal disease. Musculoskeletal: Presumably injection related subcutaneous nodularity superficial the gluteals. Similar. No acute osseous abnormality. IMPRESSION: 1. 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 2. No new or progressive disease. 3. Similar prominent but not pathologically sized abdominal retroperitoneal nodes, nonspecific. 4. Status post low anterior resection with descending colostomy. Possible constipation. 5. Age advanced coronary artery atherosclerosis. Recommend assessment of coronary risk factors. 6.  Aortic Atherosclerosis (ICD10-I70.0). 7. Left nephrolithiasis and renal scarring. Electronically Signed   By: Jeronimo Greaves M.D.   On: 03/03/2024 08:50

## 2024-03-25 LAB — CEA: CEA: 4.2 ng/mL (ref 0.0–4.7)

## 2024-03-26 ENCOUNTER — Inpatient Hospital Stay: Attending: Oncology

## 2024-03-26 ENCOUNTER — Inpatient Hospital Stay

## 2024-03-26 VITALS — BP 121/85 | HR 90 | Resp 18

## 2024-03-26 DIAGNOSIS — Z452 Encounter for adjustment and management of vascular access device: Secondary | ICD-10-CM | POA: Insufficient documentation

## 2024-03-26 DIAGNOSIS — C20 Malignant neoplasm of rectum: Secondary | ICD-10-CM | POA: Insufficient documentation

## 2024-03-26 MED ORDER — SODIUM CHLORIDE 0.9% FLUSH
10.0000 mL | INTRAVENOUS | Status: DC | PRN
  Administered 2024-03-26: 10 mL
  Filled 2024-03-26: qty 10

## 2024-03-26 MED ORDER — HEPARIN SOD (PORK) LOCK FLUSH 100 UNIT/ML IV SOLN
500.0000 [IU] | Freq: Once | INTRAVENOUS | Status: AC | PRN
Start: 2024-03-26 — End: 2024-03-26
  Administered 2024-03-26: 500 [IU]
  Filled 2024-03-26: qty 5

## 2024-04-07 ENCOUNTER — Inpatient Hospital Stay

## 2024-04-07 ENCOUNTER — Encounter: Payer: Self-pay | Admitting: Oncology

## 2024-04-07 ENCOUNTER — Inpatient Hospital Stay: Admitting: Oncology

## 2024-04-07 VITALS — BP 122/94 | HR 101 | Temp 97.4°F | Resp 18 | Wt 129.3 lb

## 2024-04-07 DIAGNOSIS — T451X5A Adverse effect of antineoplastic and immunosuppressive drugs, initial encounter: Secondary | ICD-10-CM | POA: Diagnosis not present

## 2024-04-07 DIAGNOSIS — Z5111 Encounter for antineoplastic chemotherapy: Secondary | ICD-10-CM

## 2024-04-07 DIAGNOSIS — Z720 Tobacco use: Secondary | ICD-10-CM

## 2024-04-07 DIAGNOSIS — C20 Malignant neoplasm of rectum: Secondary | ICD-10-CM

## 2024-04-07 DIAGNOSIS — G620 Drug-induced polyneuropathy: Secondary | ICD-10-CM | POA: Diagnosis not present

## 2024-04-07 LAB — CMP (CANCER CENTER ONLY)
ALT: 13 U/L (ref 0–44)
AST: 19 U/L (ref 15–41)
Albumin: 3.9 g/dL (ref 3.5–5.0)
Alkaline Phosphatase: 74 U/L (ref 38–126)
Anion gap: 10 (ref 5–15)
BUN: 21 mg/dL — ABNORMAL HIGH (ref 6–20)
CO2: 24 mmol/L (ref 22–32)
Calcium: 8.9 mg/dL (ref 8.9–10.3)
Chloride: 105 mmol/L (ref 98–111)
Creatinine: 0.89 mg/dL (ref 0.44–1.00)
GFR, Estimated: >60 mL/min (ref >60)
Glucose, Bld: 144 mg/dL — ABNORMAL HIGH (ref 70–99)
Potassium: 4 mmol/L (ref 3.5–5.1)
Sodium: 139 mmol/L (ref 135–145)
Total Bilirubin: 0.4 mg/dL (ref 0.0–1.2)
Total Protein: 6.9 g/dL (ref 6.5–8.1)

## 2024-04-07 LAB — CBC WITH DIFFERENTIAL (CANCER CENTER ONLY)
Abs Immature Granulocytes: 0.08 10*3/uL — ABNORMAL HIGH (ref 0.00–0.07)
Basophils Absolute: 0 10*3/uL (ref 0.0–0.1)
Basophils Relative: 1 %
Eosinophils Absolute: 0.1 10*3/uL (ref 0.0–0.5)
Eosinophils Relative: 1 %
HCT: 42 % (ref 36.0–46.0)
Hemoglobin: 14.2 g/dL (ref 12.0–15.0)
Immature Granulocytes: 2 %
Lymphocytes Relative: 13 %
Lymphs Abs: 0.7 10*3/uL (ref 0.7–4.0)
MCH: 33.5 pg (ref 26.0–34.0)
MCHC: 33.8 g/dL (ref 30.0–36.0)
MCV: 99.1 fL (ref 80.0–100.0)
Monocytes Absolute: 0.6 10*3/uL (ref 0.1–1.0)
Monocytes Relative: 10 %
Neutro Abs: 4.1 10*3/uL (ref 1.7–7.7)
Neutrophils Relative %: 73 %
Platelet Count: 125 10*3/uL — ABNORMAL LOW (ref 150–400)
RBC: 4.24 MIL/uL (ref 3.87–5.11)
RDW: 14.6 % (ref 11.5–15.5)
WBC Count: 5.5 10*3/uL (ref 4.0–10.5)
nRBC: 0 % (ref 0.0–0.2)

## 2024-04-07 LAB — PROTEIN, URINE, RANDOM: Total Protein, Urine: 21 mg/dL

## 2024-04-07 MED ORDER — LEUCOVORIN CALCIUM INJECTION 350 MG
400.0000 mg/m2 | Freq: Once | INTRAMUSCULAR | Status: AC
  Administered 2024-04-07: 644 mg via INTRAVENOUS
  Filled 2024-04-07: qty 25

## 2024-04-07 MED ORDER — FLUOROURACIL CHEMO INJECTION 2.5 GM/50ML
400.0000 mg/m2 | Freq: Once | INTRAVENOUS | Status: AC
  Administered 2024-04-07: 650 mg via INTRAVENOUS
  Filled 2024-04-07: qty 13

## 2024-04-07 MED ORDER — SODIUM CHLORIDE 0.9 % IV SOLN
2400.0000 mg/m2 | INTRAVENOUS | Status: DC
  Administered 2024-04-07: 3850 mg via INTRAVENOUS
  Filled 2024-04-07: qty 77

## 2024-04-07 MED ORDER — DEXAMETHASONE SODIUM PHOSPHATE 10 MG/ML IJ SOLN
10.0000 mg | Freq: Once | INTRAMUSCULAR | Status: AC
  Administered 2024-04-07: 10 mg via INTRAVENOUS
  Filled 2024-04-07: qty 1

## 2024-04-07 MED ORDER — SODIUM CHLORIDE 0.9 % IV SOLN
180.0000 mg/m2 | Freq: Once | INTRAVENOUS | Status: AC
  Administered 2024-04-07: 300 mg via INTRAVENOUS
  Filled 2024-04-07: qty 15

## 2024-04-07 MED ORDER — ATROPINE SULFATE 1 MG/ML IV SOLN
0.5000 mg | Freq: Once | INTRAVENOUS | Status: AC
  Administered 2024-04-07: 0.5 mg via INTRAVENOUS
  Filled 2024-04-07: qty 1

## 2024-04-07 MED ORDER — SODIUM CHLORIDE 0.9 % IV SOLN
Freq: Once | INTRAVENOUS | Status: AC
  Filled 2024-04-07: qty 250

## 2024-04-07 MED ORDER — SODIUM CHLORIDE 0.9 % IV SOLN
5.0000 mg/kg | Freq: Once | INTRAVENOUS | Status: AC
  Administered 2024-04-07: 300 mg via INTRAVENOUS
  Filled 2024-04-07: qty 12

## 2024-04-07 MED ORDER — PALONOSETRON HCL INJECTION 0.25 MG/5ML
0.2500 mg | Freq: Once | INTRAVENOUS | Status: AC
  Administered 2024-04-07: 0.25 mg via INTRAVENOUS
  Filled 2024-04-07: qty 5

## 2024-04-07 NOTE — Patient Instructions (Signed)
 [facility] - A DEPT OF [facility]  Discharge Instructions: Thank you for choosing [facility] to provide your oncology and hematology care.  If you have a lab appointment with the Cancer Center, please go directly to the Cancer Center and check in at the registration area.  Wear comfortable clothing and clothing appropriate for easy access to any Portacath or PICC line.   We strive to give you quality time with your provider. You may need to reschedule your appointment if you arrive late (15 or more minutes).  Arriving late affects you and other patients whose appointments are after yours.  Also, if you miss three or more appointments without notifying the office, you may be dismissed from the clinic at the provider's discretion.      For prescription refill requests, have your pharmacy contact our office and allow 72 hours for refills to be completed.    Today you received the following chemotherapy and/or immunotherapy agents FOLFIRI      To help prevent nausea and vomiting after your treatment, we encourage you to take your nausea medication as directed.  BELOW ARE SYMPTOMS THAT SHOULD BE REPORTED IMMEDIATELY: *FEVER GREATER THAN 100.4 F (38 C) OR HIGHER *CHILLS OR SWEATING *NAUSEA AND VOMITING THAT IS NOT CONTROLLED WITH YOUR NAUSEA MEDICATION *UNUSUAL SHORTNESS OF BREATH *UNUSUAL BRUISING OR BLEEDING *URINARY PROBLEMS (pain or burning when urinating, or frequent urination) *BOWEL PROBLEMS (unusual diarrhea, constipation, pain near the anus) TENDERNESS IN MOUTH AND THROAT WITH OR WITHOUT PRESENCE OF ULCERS (sore throat, sores in mouth, or a toothache) UNUSUAL RASH, SWELLING OR PAIN  UNUSUAL VAGINAL DISCHARGE OR ITCHING   Items with * indicate a potential emergency and should be followed up as soon as possible or go to the Emergency Department if any problems should occur.  Please show the CHEMOTHERAPY ALERT CARD or IMMUNOTHERAPY  ALERT CARD at check-in to the Emergency Department and triage nurse.  Should you have questions after your visit or need to cancel or reschedule your appointment, please contact [facility] - A DEPT OF [facility]  [phone] and follow the prompts.  Office hours are 8:00 a.m. to 4:30 p.m. Monday - Friday. Please note that voicemails left after 4:00 p.m. may not be returned until the following business day.  We are closed weekends and major holidays. You have access to a nurse at all times for urgent questions. Please call the main number to the clinic [phone] and follow the prompts.  For any non-urgent questions, you may also contact your provider using MyChart. We now offer e-Visits for anyone 22 and older to request care online for non-urgent symptoms. For details visit [email or web address].   Also download the MyChart app! Go to the app store, search "MyChart", open the app, select [place], and log in with your MyChart username and password.

## 2024-04-07 NOTE — Progress Notes (Signed)
 Hematology/Oncology Progress note [phone] [phone]      CHIEF COMPLAINTS/REASON FOR VISIT:  Follow-up for Stage IV  rectal cancer treatments.  ASSESSMENT & PLAN:   Cancer Staging  Rectal cancer [facility]) Staging form: Colon and Rectum, AJCC 8th Edition - Pathologic stage from 05/03/2022: Stage IIIB (pT3, pN1b, cM0) - Signed by [name], MD on 05/03/2022 - Pathologic stage from 06/18/2023: Stage IVA (rpTX, pN0, pM1a) - Signed by [name], MD on 06/18/2023   Rectal cancer [facility]) History of Stage III. pT3 pN1b cM0, s/p APR. 05/2023 Stage IV current rectal adenocarcinoma Currently on adjuvant chemotherapy. S/p FOLFOX x 4 cycles S/p concurrent chemotherapy [Xeloda 1300 mg BID] with RT, and then another 4 cycles of adjuvant FOLFOX [dose reduced oxaliplatin and omit 5-FU bolus 05/2023 CT imaging indicates disease progression. liver biopsy pathology showed metastatic carcinoma--> FOLFIRI + Bevacizumab   NGS showed TP53 missense variant, APC frameshift, TCF7L2 Frameshift, FLT3 copy number gain., TMB 4.7, pMMR Wildtype KRAS/NRAS Labs are reviewed and discussed with patient. Proceed with  FOLFIRI + Bevacizumab  CT scan showed further treatment response. Continue current regimen.    Chemotherapy-induced neuropathy (HCC) Grade 2  Continue gabapentin 300mg  BID.   Encounter for antineoplastic chemotherapy Chemotherapy plan as listed above  Tobacco use Recommend smoke cessation. .       Orders Placed This Encounter  Procedures   CEA    Standing Status:   Future    Expected Date:   04/21/2024    Expiration Date:   04/21/2025   Protein, urine, random    Standing Status:   Future    Expected Date:   04/21/2024    Expiration Date:   04/21/2025   CBC with Differential (Cancer Center Only)    Standing Status:   Future    Expected Date:   04/21/2024    Expiration Date:   04/21/2025   CMP (Cancer Center only)    Standing Status:   Future    Expected Date:   04/21/2024     Expiration Date:   04/21/2025   CEA    Standing Status:   Future    Expected Date:   05/05/2024    Expiration Date:   05/05/2025   Protein, urine, random    Standing Status:   Future    Expected Date:   05/05/2024    Expiration Date:   05/05/2025   CBC with Differential (Cancer Center Only)    Standing Status:   Future    Expected Date:   05/05/2024    Expiration Date:   05/05/2025   CMP (Cancer Center only)    Standing Status:   Future    Expected Date:   05/05/2024    Expiration Date:   05/05/2025   CEA    Standing Status:   Future    Expected Date:   05/19/2024    Expiration Date:   05/19/2025   Protein, urine, random    Standing Status:   Future    Expected Date:   05/19/2024    Expiration Date:   05/19/2025   CBC with Differential (Cancer Center Only)    Standing Status:   Future    Expected Date:   05/19/2024    Expiration Date:   05/19/2025   CMP (Cancer Center only)    Standing Status:   Future    Expected Date:   05/19/2024    Expiration Date:   05/19/2025    Follow-up 2 week(s)  All questions were  answered. The patient knows to call the clinic with any problems, questions or concerns.  [name], MD, PhD [facility] Hematology Oncology 04/07/2024      HISTORY OF PRESENTING ILLNESS:   April Luna is a  53 y.o.  female presents for treatment of rectal cancer Oncology History  Rectal cancer (HCC)  02/26/2022 Imaging   MRI PELVIS WITHOUT CONTRAST- By imaging, rectal cancer stage:  T1/T2, N0, Mx    03/02/2022 Imaging   CT CHEST AND ABDOMEN WITH CONTRAST 1. No convincing evidence of metastatic disease within the chest or abdomen. 2. Atrophic left kidney with multifocal renal scarring and cortical calcifications as well as nonobstructive renal stones measuring up to 5 mm. 3. Prominent left-sided predominant retroperitoneal lymph nodes measuring up to 8 mm near the level of the renal hilum, overall decreased in size dating back to CT September 19, 2018 and favored  reactive related to left renal inflammation. 4.  Aortic Atherosclerosis (ICD10-I70.0).   03/27/2022 Genetic Testing    [facility] Custom[employer]+RNA cancer panel found no pathogenic mutations.    04/21/2022 Initial Diagnosis   Rectal cancer - baseline CEA 3.6 -02/09/2022, patient had colonoscopy which showed renal mass 10 cm from anal verge.  5 mm polyp in ascending colon.  Removed and retrieved. Pathology showed rectal adenocarcinoma.  The polyp in the ascending colon is a tubular adenoma.  MRI showed cT1/T2N0 disease  -04/21/2022, patient underwent robotic assisted ultralow anterior resection. Pathology showed moderately differentiated adenocarcinoma, 4.5 cm in maximal extent, with focal extension through muscularis propria into perirectal soft tissue.  3 lymph nodes positive for metastatic carcinoma.  Negative margin.  pT3 pN1b, MSI stable.   05/03/2022 Cancer Staging   Staging form: Colon and Rectum, AJCC 8th Edition - Pathologic stage from 05/03/2022: Stage IIIB (pT3, pN1b, cM0) - Signed by [name], MD on 05/03/2022 Stage prefix: Initial diagnosis   05/11/2022 Miscellaneous   Medi port placed by Dr.White   05/26/2022 -  Chemotherapy   FOLFOX Q2 weeks x 4   05/26/2022 - 07/09/2022 Chemotherapy   Patient is on Treatment Plan : COLORECTAL FOLFOX q14d x 8 cycles     05/26/2022 - 12/13/2022 Chemotherapy   Patient is on Treatment Plan : COLORECTAL FOLFOX q14d x 4 months     07/27/2022 -  Chemotherapy   Concurrent chemotherapy- Xeloda  1300mg  BID and radiation.    07/27/2022 - 09/12/2022 Radiation Therapy    concurrent chemotherapy [Xeloda 1300 mg BID] with RT    09/20/2022 Imaging   CT Angiogram chest PET protocol There is no evidence of pulmonary artery embolism. There is no evidence of thoracic aortic dissection.   Small linear patchy alveolar infiltrate is seen in medial segment of right middle lobe suggesting atelectasis/pneumonia.     09/28/2022 - 09/30/2022 Hospital Admission   Admission due to  acute respiratory failure with hypoxia, COPD exacerbation   09/28/2022 Imaging   CT angio chest PE protocol 1. Negative for acute PE or thoracic aortic dissection. 2. New ground-glass infiltrates anteriorly in bilateral upper lobes,may represent atypical edema, infectious or inflammatory process. 3.  Aortic Atherosclerosis     12/22/2022 Imaging   CT chest abdomen pelvis w contrast  Stable exam. No evidence of recurrent or metastatic carcinoma within the chest, abdomen, or pelvis.   Left nephrolithiasis and renal parenchymal atrophy. No evidence of ureteral calculi or hydronephrosis.   Aortic Atherosclerosis   05/31/2023 Imaging   CT chest abdomen pelvis with contrast showed 1. New hypodense 16 mm  lesion in the right lobe of the liver with very subtle adjacent 5 mm lesion, suspicious for metastatic disease. 2. Small bilateral pulmonary nodules, some of which were subtly evident on prior examination only in retrospect, some of which demonstrate degree of cavitation, suspicious for metastatic disease. 3. Prominent retroperitoneal lymph nodes are similar prior. 4. Prior low anterior resection with Hartmann's pouch formation,similar small amount of perirectal/presacral soft tissue and fluid,favored postsurgical/posttreatment change. Suggest continued attention on follow-up imaging. 5. Large volume of formed stool in the colon. 6. Nonobstructive left renal calculi measure under 1 cm.     06/08/2023 Relapse/Recurrence   Ultrasound-guided liver biopsy showed Pathology showed metastatic carcinoma, morphology consistent with patient's clinical history of rectal adenocarcinoma.  [employer] NGS 648 gene panel showed TP53 missense variant, APC frameshift, TCF7L2 frameshift, FLT3 copy number gain, TMB 4.62m/MB MSI stable.  Wildtype KRAS/NRAS  [facility] RNA Seq - ERBB3 overexpressed, VEGFA overexpressed, NRAS overexpressed.     06/18/2023 Cancer Staging   Staging form: Colon and Rectum, AJCC 8th  Edition - Pathologic stage from 06/18/2023: Stage IVA (rpTX, pN0, pM1a) - Signed by Timmy Forbes, MD on 06/18/2023 Stage prefix: Recurrence Total positive nodes: 0   06/25/2023 - 06/25/2023 Chemotherapy   Patient is on Treatment Plan : COLORECTAL FOLFOX + Bevacizumab q14d     07/02/2023 -  Chemotherapy   Patient is on Treatment Plan : COLORECTAL FOLFIRI + Bevacizumab q14d     11/13/2023 Imaging   CT chest abdomen pelvis w contrast showed  1. Increase in cavitation with decreased soft tissue involving the scattered bilateral pulmonary nodules, consistent with treatment response. No new suspicious pulmonary nodules or masses. 2. Decrease in size and conspicuity of the hypodense lesions in the right lobe of the liver, consistent with treatment response. No new suspicious hepatic lesion identified. 3. Stable prominent nonspecific retroperitoneal lymph nodes. Suggest continued attention on follow-up imaging. 4. Similar surgical changes of prior low anterior resection with left anterior abdominal wall colostomy. Similar mesorectal/presacral soft tissue, no new suspicious enhancing nodularity. 5. Similar soft tissue nodularity in the bilateral posterior gluteal subcutaneous soft tissues. 6. Moderate volume of formed stool in the colon. Correlate for constipation.   03/03/2024 Imaging   CT chest abdomen pelvis w contrast showed 1. 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 2. No new or progressive disease. 3. Similar prominent but not pathologically sized abdominal retroperitoneal nodes, nonspecific. 4. Status post low anterior resection with descending colostomy. Possible constipation. 5. Age advanced coronary artery atherosclerosis. Recommend assessment of coronary risk factors. 6.  Aortic Atherosclerosis (ICD10-I70.0). 7. Left nephrolithiasis and renal scarring.    Patient has bipolar and schizophrenia..  Patient is married  She has housing issue due to previous history  of eviction.  she does not live with her husband currently. She lives with some family members.   + chronic dysuria and urgency she was seen by Urology and was started on Gemteza.  Symptoms are improved.    INTERVAL HISTORY GENEA RHEAUME is a 53 y.o. female who has above history reviewed by me today presents for follow up visit for rectal cancer.   Denies any melena or blood in the stool. Stable neuropathy symptoms. +decreased appetite. Weight is stable.   Review of Systems  Constitutional:  Positive for fatigue. Negative for appetite change, chills and fever.  HENT:   Negative for hearing loss, mouth sores and voice change.   Eyes:  Negative for eye problems.  Respiratory:  Negative for chest tightness, cough  and shortness of breath.   Cardiovascular:  Negative for chest pain.  Gastrointestinal:  Negative for abdominal distention, abdominal pain, blood in stool, diarrhea and nausea.       + colostomy   Endocrine: Negative for hot flashes.  Genitourinary:  Negative for difficulty urinating, dysuria and frequency.   Musculoskeletal:  Negative for arthralgias.  Skin:  Negative for itching and rash.  Neurological:  Positive for numbness. Negative for extremity weakness.  Hematological:  Negative for adenopathy.  Psychiatric/Behavioral:  Negative for confusion.     MEDICAL HISTORY:  Past Medical History:  Diagnosis Date   Allergy    pollen   Anxiety    Arthritis    right hip   Bipolar 1 disorder (HCC)    Cancer (HCC)    rectal   Chemotherapy-induced neuropathy (HCC) 10/24/2022   Chronic kidney disease    COPD (chronic obstructive pulmonary disease) (HCC)    Depression    Family history of adverse reaction to anesthesia    grand father had a stroke during anesthesia   Family history of breast cancer    Family history of colon cancer    Family history of uterine cancer    GERD (gastroesophageal reflux disease)    History of kidney stones    Hyperlipidemia     Hypertension    Hypothyroidism    Panic attack    Pneumonia    Psoriasis    Sleep apnea 08/11/2021   No CPAP   Type 2 diabetes mellitus with microalbuminuria, without long-term current use of insulin (HCC) 06/24/2019    SURGICAL HISTORY: Past Surgical History:  Procedure Laterality Date   BREAST BIOPSY Left 12/14/2021   [place] bx, venus marker, path pending   CESAREAN SECTION     COLONOSCOPY WITH PROPOFOL N/A 02/09/2022   Procedure: COLONOSCOPY WITH PROPOFOL;  Surgeon: Toney Reil, MD;  Location: [facility];  Service: Endoscopy;  Laterality: N/A;  sleep apnea   CYSTOSCOPY W/ RETROGRADES Left 11/08/2018   Procedure: CYSTOSCOPY WITH RETROGRADE PYELOGRAM;  Surgeon: Sondra Come, MD;  Location: [facility];  Service: Urology;  Laterality: Left;   CYSTOSCOPY/URETEROSCOPY/HOLMIUM LASER/STENT PLACEMENT Left 11/08/2018   Procedure: CYSTOSCOPY/URETEROSCOPY/HOLMIUM LASER/STENT PLACEMENT;  Surgeon: Sondra Come, MD;  Location: [facility];  Service: Urology;  Laterality: Left;   IR CV LINE INJECTION  06/25/2023   IR IMAGING GUIDED PORT INSERTION  05/11/2022   IR REMOVE CV FIBRIN SHEATH  06/27/2023   MOUTH SURGERY     wisdom teeth extraction   MOUTH SURGERY     teeth removal   POLYPECTOMY N/A 02/09/2022   Procedure: POLYPECTOMY;  Surgeon: Toney Reil, MD;  Location: [facility];  Service: Endoscopy;  Laterality: N/A;   XI ROBOTIC ASSISTED LOWER ANTERIOR RESECTION N/A 04/21/2022   Procedure: XI ROBOTIC ASSISTED LOWER ANTERIOR RESECTION WITH COLOSTOMY, BILATERAL TAP BLOCK, ASSESSMENT OF TISSUE PERFUSSION WITH FIREFLY INJECTION;  Surgeon: Andria Meuse, MD;  Location: [facility];  Service: General;  Laterality: N/A;    SOCIAL HISTORY: Social History   Socioeconomic History   Marital status: Married    Spouse name: Lesle Chris   Number of children: 1   Years of education: 12   Highest education level: High school graduate  Occupational History    Occupation: unemployed    Comment: disabled  Tobacco Use   Smoking status: Some Days    Current packs/day: 0.25    Average packs/day: 0.3 packs/day for 38.9 years (9.7 ttl pk-yrs)  Types: Cigarettes    Start date: 05/13/1985    Passive exposure: Current   Smokeless tobacco: Never   Tobacco comments:    5-6 cigarettes weekly- 11/07/2022  Vaping Use   Vaping status: Former   Start date: 05/25/2018   Quit date: 09/24/2018  Substance and Sexual Activity   Alcohol use: Not Currently    Alcohol/week: 4.0 standard drinks of alcohol    Types: 4 Glasses of wine per week    Comment: quit ETOH in Feb. 2023   Drug use: Yes    Types: Marijuana    Comment: 2 days ago   Sexual activity: Not Currently    Birth control/protection: Post-menopausal  Other Topics Concern   Not on file  Social History Narrative   Lives with husband and son    Social Drivers of Health   Financial Resource Strain: High Risk (08/30/2023)   Overall Financial Resource Strain (CARDIA)    Difficulty of Paying Living Expenses: Hard  Food Insecurity: Food Insecurity Present (08/30/2023)   Hunger Vital Sign    Worried About Running Out of Food in the Last Year: Sometimes true    Ran Out of Food in the Last Year: Sometimes true  Transportation Needs: No Transportation Needs (08/30/2023)   PRAPARE - Administrator, Civil Service (Medical): No    Lack of Transportation (Non-Medical): No  Physical Activity: Inactive (08/30/2023)   Exercise Vital Sign    Days of Exercise per Week: 0 days    Minutes of Exercise per Session: 0 min  Stress: Stress Concern Present (08/30/2023)   [employer] of Occupational Health - Occupational Stress Questionnaire    Feeling of Stress : Very much  Social Connections: Moderately Integrated (08/30/2023)   Social Connection and Isolation Panel [NHANES]    Frequency of Communication with Friends and Family: Never    Frequency of Social Gatherings with Friends and Family: Once a week     Attends Religious Services: More than 4 times per year    Active Member of [employer] or Organizations: No    Attends Engineer, structural: 1 to 4 times per year    Marital Status: Married  Catering manager Violence: Not At Risk (08/30/2023)   Humiliation, Afraid, Rape, and Kick questionnaire    Fear of Current or Ex-Partner: No    Emotionally Abused: No    Physically Abused: No    Sexually Abused: No    FAMILY HISTORY: Family History  Problem Relation Age of Onset   Depression Mother    Anxiety disorder Mother    Diabetes Mother    Hypertension Mother    Hyperlipidemia Mother    Cancer Mother    Uterine cancer Mother 105   Cervical cancer Mother 94   Colon cancer Father    Depression Brother    Anxiety disorder Brother    Cancer Maternal Aunt        unk types   Diabetes Mellitus II Maternal Grandmother    Hypercholesterolemia Maternal Grandmother    Breast cancer Maternal Grandmother    Cancer Paternal Grandmother    Diabetes Paternal Grandmother    Melanoma Paternal Grandmother    Stomach cancer Paternal Grandmother     ALLERGIES:  is allergic to metformin and related, nsaids, perphenazine, sulfa antibiotics, abilify [aripiprazole], and penicillins.  MEDICATIONS:  Current Outpatient Medications  Medication Sig Dispense Refill   acetaminophen (TYLENOL) 325 MG tablet Take 650 mg by mouth every 6 (six) hours as needed for headache (pain).  albuterol (VENTOLIN HFA) 108 (90 Base) MCG/ACT inhaler Inhale 2 puffs into the lungs every 6 (six) hours as needed for wheezing or shortness of breath. 1 each 5   amantadine (SYMMETREL) 100 MG capsule Take 100 mg by mouth 2 (two) times daily.     blood glucose meter kit and supplies Dispense based on patient and insurance preference. Use up to four times daily as directed. (FOR ICD-10 E10.9, E11.9). 1 each 0   Budeson-Glycopyrrol-Formoterol (BREZTRI AEROSPHERE) 160-9-4.8 MCG/ACT AERO Inhale 2 puffs into the lungs in the morning and  at bedtime. 10.7 g 2   buPROPion (WELLBUTRIN XL) 300 MG 24 hr tablet Take 300 mg by mouth daily.     Cholecalciferol (VITAMIN D-3) 125 MCG (5000 UT) TABS Take 5,000 Units by mouth daily. 30 tablet 1   clonazePAM (KLONOPIN) 0.5 MG tablet Take 1 mg by mouth 2 (two) times daily as needed.     dexamethasone (DECADRON) 4 MG tablet Take 2 tablets (8 mg total) by mouth See admin instructions. Take 8mg  daily for 2 days after each chemotherapy. 30 tablet 1   diltiazem (CARDIZEM CD) 120 MG 24 hr capsule TAKE 1 CAPSULE BY MOUTH DAILY 90 capsule 1   docusate sodium (COLACE) 100 MG capsule TAKE 1 CAPSULE BY MOUTH TWICE DAILY 60 capsule 2   FARXIGA 10 MG TABS tablet TAKE 1 TABLET BY MOUTH DAILY BEFORE BREAKFAST 90 tablet 0   gabapentin (NEURONTIN) 300 MG capsule Take 300 mg by mouth 2 (two) times daily.     icosapent Ethyl (VASCEPA) 1 g capsule Take 2 capsules (2 g total) by mouth in the morning and at bedtime. 120 capsule 2   levothyroxine (SYNTHROID) 75 MCG tablet TAKE 1 TABLET BY MOUTH DAILY BEFORE BREAKFAST 90 tablet 2   lidocaine-prilocaine (EMLA) cream Apply to affected area once 30 g 3   loperamide (IMODIUM) 2 MG capsule Take 1 capsule (2 mg total) by mouth See admin instructions. Initial: 4 mg,the 2 mg every 2 hours (4 mg every 4 hours at night)  maximum: 16 mg/day 120 capsule 2   loratadine (CLARITIN) 10 MG tablet Take 10 mg by mouth every morning.     magic mouthwash w/lidocaine SOLN Take 5 mLs by mouth 4 (four) times daily as needed for mouth pain. Sig: Swish/Swallow 5-10 ml four times a day as needed. Dispense 480 ml. 1RF 480 mL 1   ondansetron (ZOFRAN) 8 MG tablet Take 1 tablet (8 mg total) by mouth every 8 (eight) hours as needed for nausea, vomiting or refractory nausea / vomiting. Start on the third day after chemotherapy. 30 tablet 1   paliperidone (INVEGA SUSTENNA) 234 MG/1.5ML SUSY injection Inject 234 mg into the muscle every 30 (thirty) days. On or about the 14th of each month      pantoprazole (PROTONIX) 40 MG tablet TAKE 1 TABLET BY MOUTH DAILY 90 tablet 2   pioglitazone (ACTOS) 15 MG tablet Take 1 tablet (15 mg total) by mouth daily. 90 tablet 1   promethazine (PHENERGAN) 12.5 MG tablet Take 1 tablet (12.5 mg total) by mouth every 6 (six) hours as needed for nausea or vomiting. 30 tablet 0   rosuvastatin (CRESTOR) 40 MG tablet TAKE 1 TABLET BY MOUTH EVERY MORNING 90 tablet 0   sertraline (ZOLOFT) 100 MG tablet Take 200 mg by mouth every morning.     Spacer/Aero-Holding Chambers (AEROCHAMBER MV) inhaler Use as instructed 1 each 0   Vibegron (GEMTESA) 75 MG TABS Take 1 tablet (75 mg  total) by mouth daily.     No current facility-administered medications for this visit.   Facility-Administered Medications Ordered in Other Visits  Medication Dose Route Frequency Provider Last Rate Last Admin   albuterol (PROVENTIL) (2.5 MG/3ML) 0.083% nebulizer solution 2.5 mg  2.5 mg Nebulization Once Raechel Chute, MD       sodium chloride flush (NS) 0.9 % injection 10 mL  10 mL Intracatheter PRN [name], MD   10 mL at 08/15/23 1327   sodium chloride flush (NS) 0.9 % injection 10 mL  10 mL Intracatheter PRN [name], MD   10 mL at 03/26/24 1255     PHYSICAL EXAMINATION: ECOG PERFORMANCE STATUS: 0 - Asymptomatic Vitals:   04/07/24 0837  BP: (!) 122/94  Pulse: (!) 101  Resp: 18  Temp: (!) 97.4 F (36.3 C)  SpO2: 97%    Filed Weights   04/07/24 0837  Weight: 129 lb 4.8 oz (58.7 kg)     Physical Exam Constitutional:      General: She is not in acute distress. HENT:     Head: Normocephalic and atraumatic.     Mouth/Throat:     Comments: mucositis Eyes:     General: No scleral icterus. Cardiovascular:     Rate and Rhythm: Normal rate and regular rhythm.  Pulmonary:     Effort: Pulmonary effort is normal. No respiratory distress.     Breath sounds: No wheezing.     Comments: Decreased breath sound bilaterally Abdominal:     General: Bowel sounds are normal. There  is no distension.     Palpations: Abdomen is soft.     Comments: +colostomy   Musculoskeletal:        General: No deformity. Normal range of motion.     Cervical back: Normal range of motion and neck supple.  Skin:    General: Skin is warm and dry.     Findings: No erythema or rash.  Neurological:     Mental Status: She is alert and oriented to person, place, and time. Mental status is at baseline.     Cranial Nerves: No cranial nerve deficit.     Coordination: Coordination normal.  Psychiatric:        Mood and Affect: Mood normal.     LABORATORY DATA:  I have reviewed the data as listed    Latest Ref Rng & Units 04/07/2024    7:57 AM 03/24/2024    8:40 AM 03/10/2024    8:21 AM  CBC  WBC 4.0 - 10.5 K/uL 5.5  4.0  3.9   Hemoglobin 12.0 - 15.0 g/dL 09.8  11.9  14.7   Hematocrit 36.0 - 46.0 % 42.0  43.4  43.7   Platelets 150 - 400 K/uL 125  153  157       Latest Ref Rng & Units 04/07/2024    7:57 AM 03/24/2024    8:40 AM 03/10/2024    8:21 AM  CMP  Glucose 70 - 99 mg/dL 829  562  130   BUN 6 - 20 mg/dL 21  16  18    Creatinine 0.44 - 1.00 mg/dL 8.65  7.84  6.96   Sodium 135 - 145 mmol/L 139  138  139   Potassium 3.5 - 5.1 mmol/L 4.0  3.9  4.0   Chloride 98 - 111 mmol/L 105  105  102   CO2 22 - 32 mmol/L 24  23  28    Calcium 8.9 - 10.3 mg/dL 8.9  9.0  9.2   Total Protein 6.5 - 8.1 g/dL 6.9  7.0  7.2   Total Bilirubin 0.0 - 1.2 mg/dL 0.4  0.4  0.4   Alkaline Phos 38 - 126 U/L 74  76  70   AST 15 - 41 U/L 19  19  19    ALT 0 - 44 U/L 13  14  16          RADIOGRAPHIC STUDIES: I have personally reviewed the radiological images as listed and agreed with the findings in the report. No results found.

## 2024-04-07 NOTE — Assessment & Plan Note (Signed)
 Chemotherapy plan as listed above

## 2024-04-07 NOTE — Assessment & Plan Note (Signed)
 Recommend smoke cessation.

## 2024-04-07 NOTE — Progress Notes (Signed)
 Per Dr. Wilhelmenia Harada ok to proceed with pre meds and treatment while waiting on urine protein results. Can administer Avastin if urine protein results are within parameter.

## 2024-04-07 NOTE — Assessment & Plan Note (Signed)
 History of Stage III. pT3 pN1b cM0, s/p APR. 05/2023 Stage IV current rectal adenocarcinoma Currently on adjuvant chemotherapy. S/p FOLFOX x 4 cycles S/p concurrent chemotherapy [Xeloda 1300 mg BID] with RT, and then another 4 cycles of adjuvant FOLFOX [dose reduced oxaliplatin and omit 5-FU bolus 05/2023 CT imaging indicates disease progression. liver biopsy pathology showed metastatic carcinoma--> FOLFIRI + Bevacizumab   NGS showed TP53 missense variant, APC frameshift, TCF7L2 Frameshift, FLT3 copy number gain., TMB 4.7, pMMR Wildtype KRAS/NRAS Labs are reviewed and discussed with patient. Proceed with  FOLFIRI + Bevacizumab  CT scan showed further treatment response. Continue current regimen.

## 2024-04-07 NOTE — Assessment & Plan Note (Signed)
 Grade 2  Continue gabapentin 300mg  BID.

## 2024-04-08 LAB — CEA: CEA: 3.5 ng/mL (ref 0.0–4.7)

## 2024-04-09 ENCOUNTER — Inpatient Hospital Stay

## 2024-04-09 VITALS — BP 123/86 | HR 90 | Temp 97.0°F | Resp 18

## 2024-04-09 DIAGNOSIS — C20 Malignant neoplasm of rectum: Secondary | ICD-10-CM | POA: Diagnosis not present

## 2024-04-09 MED ORDER — SODIUM CHLORIDE 0.9% FLUSH
10.0000 mL | INTRAVENOUS | Status: DC | PRN
  Administered 2024-04-09: 10 mL
  Filled 2024-04-09: qty 10

## 2024-04-09 MED ORDER — HEPARIN SOD (PORK) LOCK FLUSH 100 UNIT/ML IV SOLN
500.0000 [IU] | Freq: Once | INTRAVENOUS | Status: AC | PRN
  Administered 2024-04-09: 500 [IU]
  Filled 2024-04-09: qty 5

## 2024-04-21 ENCOUNTER — Encounter: Payer: Self-pay | Admitting: Oncology

## 2024-04-21 ENCOUNTER — Inpatient Hospital Stay (HOSPITAL_BASED_OUTPATIENT_CLINIC_OR_DEPARTMENT_OTHER): Admitting: Oncology

## 2024-04-21 ENCOUNTER — Inpatient Hospital Stay

## 2024-04-21 VITALS — BP 121/91 | HR 75 | Temp 97.3°F | Resp 19 | Wt 129.2 lb

## 2024-04-21 DIAGNOSIS — C20 Malignant neoplasm of rectum: Secondary | ICD-10-CM

## 2024-04-21 DIAGNOSIS — Z5111 Encounter for antineoplastic chemotherapy: Secondary | ICD-10-CM | POA: Diagnosis not present

## 2024-04-21 DIAGNOSIS — R634 Abnormal weight loss: Secondary | ICD-10-CM

## 2024-04-21 DIAGNOSIS — T451X5A Adverse effect of antineoplastic and immunosuppressive drugs, initial encounter: Secondary | ICD-10-CM

## 2024-04-21 DIAGNOSIS — G620 Drug-induced polyneuropathy: Secondary | ICD-10-CM

## 2024-04-21 LAB — CBC WITH DIFFERENTIAL (CANCER CENTER ONLY)
Abs Immature Granulocytes: 0.01 10*3/uL (ref 0.00–0.07)
Basophils Absolute: 0 10*3/uL (ref 0.0–0.1)
Basophils Relative: 1 %
Eosinophils Absolute: 0.1 10*3/uL (ref 0.0–0.5)
Eosinophils Relative: 3 %
HCT: 39.7 % (ref 36.0–46.0)
Hemoglobin: 13.3 g/dL (ref 12.0–15.0)
Immature Granulocytes: 0 %
Lymphocytes Relative: 19 %
Lymphs Abs: 0.6 10*3/uL — ABNORMAL LOW (ref 0.7–4.0)
MCH: 33.8 pg (ref 26.0–34.0)
MCHC: 33.5 g/dL (ref 30.0–36.0)
MCV: 100.8 fL — ABNORMAL HIGH (ref 80.0–100.0)
Monocytes Absolute: 0.4 10*3/uL (ref 0.1–1.0)
Monocytes Relative: 15 %
Neutro Abs: 1.9 10*3/uL (ref 1.7–7.7)
Neutrophils Relative %: 62 %
Platelet Count: 144 10*3/uL — ABNORMAL LOW (ref 150–400)
RBC: 3.94 MIL/uL (ref 3.87–5.11)
RDW: 14.5 % (ref 11.5–15.5)
WBC Count: 3 10*3/uL — ABNORMAL LOW (ref 4.0–10.5)
nRBC: 0 % (ref 0.0–0.2)

## 2024-04-21 LAB — CMP (CANCER CENTER ONLY)
ALT: 13 U/L (ref 0–44)
AST: 17 U/L (ref 15–41)
Albumin: 3.5 g/dL (ref 3.5–5.0)
Alkaline Phosphatase: 78 U/L (ref 38–126)
Anion gap: 9 (ref 5–15)
BUN: 13 mg/dL (ref 6–20)
CO2: 21 mmol/L — ABNORMAL LOW (ref 22–32)
Calcium: 8.5 mg/dL — ABNORMAL LOW (ref 8.9–10.3)
Chloride: 108 mmol/L (ref 98–111)
Creatinine: 0.85 mg/dL (ref 0.44–1.00)
GFR, Estimated: >60 mL/min (ref >60)
Glucose, Bld: 146 mg/dL — ABNORMAL HIGH (ref 70–99)
Potassium: 3.6 mmol/L (ref 3.5–5.1)
Sodium: 138 mmol/L (ref 135–145)
Total Bilirubin: 0.2 mg/dL (ref 0.0–1.2)
Total Protein: 6.3 g/dL — ABNORMAL LOW (ref 6.5–8.1)

## 2024-04-21 LAB — PROTEIN, URINE, RANDOM: Total Protein, Urine: 7 mg/dL

## 2024-04-21 MED ORDER — FLUOROURACIL CHEMO INJECTION 2.5 GM/50ML
400.0000 mg/m2 | Freq: Once | INTRAVENOUS | Status: AC
  Administered 2024-04-21: 650 mg via INTRAVENOUS
  Filled 2024-04-21: qty 13

## 2024-04-21 MED ORDER — SODIUM CHLORIDE 0.9 % IV SOLN
Freq: Once | INTRAVENOUS | Status: AC
Start: 2024-04-21 — End: 2024-04-21
  Filled 2024-04-21: qty 250

## 2024-04-21 MED ORDER — ATROPINE SULFATE 1 MG/ML IV SOLN
0.5000 mg | Freq: Once | INTRAVENOUS | Status: AC
  Administered 2024-04-21: 0.5 mg via INTRAVENOUS
  Filled 2024-04-21: qty 1

## 2024-04-21 MED ORDER — SODIUM CHLORIDE 0.9 % IV SOLN
180.0000 mg/m2 | Freq: Once | INTRAVENOUS | Status: AC
  Administered 2024-04-21: 300 mg via INTRAVENOUS
  Filled 2024-04-21: qty 15

## 2024-04-21 MED ORDER — PALONOSETRON HCL INJECTION 0.25 MG/5ML
0.2500 mg | Freq: Once | INTRAVENOUS | Status: AC
  Administered 2024-04-21: 0.25 mg via INTRAVENOUS
  Filled 2024-04-21: qty 5

## 2024-04-21 MED ORDER — SODIUM CHLORIDE 0.9 % IV SOLN
5.0000 mg/kg | Freq: Once | INTRAVENOUS | Status: AC
  Administered 2024-04-21: 300 mg via INTRAVENOUS
  Filled 2024-04-21: qty 12

## 2024-04-21 MED ORDER — SODIUM CHLORIDE 0.9 % IV SOLN
2400.0000 mg/m2 | INTRAVENOUS | Status: DC
  Administered 2024-04-21: 3850 mg via INTRAVENOUS
  Filled 2024-04-21: qty 77

## 2024-04-21 MED ORDER — DEXAMETHASONE SODIUM PHOSPHATE 10 MG/ML IJ SOLN
10.0000 mg | Freq: Once | INTRAMUSCULAR | Status: AC
  Administered 2024-04-21: 10 mg via INTRAVENOUS
  Filled 2024-04-21: qty 1

## 2024-04-21 MED ORDER — LEUCOVORIN CALCIUM INJECTION 350 MG
400.0000 mg/m2 | Freq: Once | INTRAMUSCULAR | Status: AC
  Administered 2024-04-21: 644 mg via INTRAVENOUS
  Filled 2024-04-21: qty 32.2

## 2024-04-21 NOTE — Assessment & Plan Note (Signed)
 Grade 2  Continue gabapentin 300mg  BID.

## 2024-04-21 NOTE — Patient Instructions (Signed)
 [facility] - A DEPT OF [facility]  Discharge Instructions: Thank you for choosing [facility] to provide your oncology and hematology care.  If you have a lab appointment with the Cancer Center, please go directly to the Cancer Center and check in at the registration area.  Wear comfortable clothing and clothing appropriate for easy access to any Portacath or PICC line.   We strive to give you quality time with your provider. You may need to reschedule your appointment if you arrive late (15 or more minutes).  Arriving late affects you and other patients whose appointments are after yours.  Also, if you miss three or more appointments without notifying the office, you may be dismissed from the clinic at the provider's discretion.      For prescription refill requests, have your pharmacy contact our office and allow 72 hours for refills to be completed.    Today you received the following chemotherapy and/or immunotherapy agents MVASI , IRINOTECAN , LEUCOVORIN , 5 Fu      To help prevent nausea and vomiting after your treatment, we encourage you to take your nausea medication as directed.  BELOW ARE SYMPTOMS THAT SHOULD BE REPORTED IMMEDIATELY: *FEVER GREATER THAN 100.4 F (38 C) OR HIGHER *CHILLS OR SWEATING *NAUSEA AND VOMITING THAT IS NOT CONTROLLED WITH YOUR NAUSEA MEDICATION *UNUSUAL SHORTNESS OF BREATH *UNUSUAL BRUISING OR BLEEDING *URINARY PROBLEMS (pain or burning when urinating, or frequent urination) *BOWEL PROBLEMS (unusual diarrhea, constipation, pain near the anus) TENDERNESS IN MOUTH AND THROAT WITH OR WITHOUT PRESENCE OF ULCERS (sore throat, sores in mouth, or a toothache) UNUSUAL RASH, SWELLING OR PAIN  UNUSUAL VAGINAL DISCHARGE OR ITCHING   Items with * indicate a potential emergency and should be followed up as soon as possible or go to the Emergency Department if any problems should occur.  Please show the CHEMOTHERAPY  ALERT CARD or IMMUNOTHERAPY ALERT CARD at check-in to the Emergency Department and triage nurse.  Should you have questions after your visit or need to cancel or reschedule your appointment, please contact [facility] - A DEPT OF [facility]  [phone] and follow the prompts.  Office hours are 8:00 a.m. to 4:30 p.m. Monday - Friday. Please note that voicemails left after 4:00 p.m. may not be returned until the following business day.  We are closed weekends and major holidays. You have access to a nurse at all times for urgent questions. Please call the main number to the clinic [phone] and follow the prompts.  For any non-urgent questions, you may also contact your provider using MyChart. We now offer e-Visits for anyone 2 and older to request care online for non-urgent symptoms. For details visit [email or web address].   Also download the MyChart app! Go to the app store, search "MyChart", open the app, select [facility], and log in with your MyChart username and password.  Bevacizumab  Injection What is this medication? BEVACIZUMAB  (be va SIZ yoo mab) treats some types of cancer. It works by blocking a protein that causes cancer cells to grow and multiply. This helps to slow or stop the spread of cancer cells. It is a monoclonal antibody. This medicine may be used for other purposes; ask your health care provider or pharmacist if you have questions. COMMON BRAND NAME(S): Alymsys , Avastin , MVASI , Vegzalma, Zirabev  What should I tell my care team before I take this medication? They need to know if you have any  of these conditions: Blood clots Coughing up blood Having or recent surgery Heart failure High blood pressure History of a connection between 2 or more body parts that do not usually connect (fistula) History of a tear in your stomach or intestines Protein in your urine An unusual or allergic reaction to bevacizumab , other medications, foods,  dyes, or preservatives Pregnant or trying to get pregnant Breast-feeding How should I use this medication? This medication is injected into a vein. It is given by your care team in a hospital or clinic setting. Talk to your care team the use of this medication in children. Special care may be needed. Overdosage: If you think you have taken too much of this medicine contact a poison control center or emergency room at once. NOTE: This medicine is only for you. Do not share this medicine with others. What if I miss a dose? Keep appointments for follow-up doses. It is important not to miss your dose. Call your care team if you are unable to keep an appointment. What may interact with this medication? Interactions are not expected. This list may not describe all possible interactions. Give your health care provider a list of all the medicines, herbs, non-prescription drugs, or dietary supplements you use. Also tell them if you smoke, drink alcohol, or use illegal drugs. Some items may interact with your medicine. What should I watch for while using this medication? Your condition will be monitored carefully while you are receiving this medication. You may need blood work while taking this medication. This medication may make you feel generally unwell. This is not uncommon as chemotherapy can affect healthy cells as well as cancer cells. Report any side effects. Continue your course of treatment even though you feel ill unless your care team tells you to stop. This medication may increase your risk to bruise or bleed. Call your care team if you notice any unusual bleeding. Before having surgery, talk to your care team to make sure it is ok. This medication can increase the risk of poor healing of your surgical site or wound. You will need to stop this medication for 28 days before surgery. After surgery, wait at least 28 days before restarting this medication. Make sure the surgical site or wound is  healed enough before restarting this medication. Talk to your care team if questions. Talk to your care team if you may be pregnant. Serious birth defects can occur if you take this medication during pregnancy and for 6 months after the last dose. Contraception is recommended while taking this medication and for 6 months after the last dose. Your care team can help you find the option that works for you. Do not breastfeed while taking this medication and for 6 months after the last dose. This medication can cause infertility. Talk to your care team if you are concerned about your fertility. What side effects may I notice from receiving this medication? Side effects that you should report to your care team as soon as possible: Allergic reactions--skin rash, itching, hives, swelling of the face, lips, tongue, or throat Bleeding--bloody or black, tar-like stools, vomiting blood or brown material that looks like coffee grounds, red or dark brown urine, small red or purple spots on skin, unusual bruising or bleeding Blood clot--pain, swelling, or warmth in the leg, shortness of breath, chest pain Heart attack--pain or tightness in the chest, shoulders, arms, or jaw, nausea, shortness of breath, cold or clammy skin, feeling faint or lightheaded Heart failure--shortness of breath,  swelling of the ankles, feet, or hands, sudden weight gain, unusual weakness or fatigue Increase in blood pressure Infection--fever, chills, cough, sore throat, wounds that don't heal, pain or trouble when passing urine, general feeling of discomfort or being unwell Infusion reactions--chest pain, shortness of breath or trouble breathing, feeling faint or lightheaded Kidney injury--decrease in the amount of urine, swelling of the ankles, hands, or feet Stomach pain that is severe, does not go away, or gets worse Stroke--sudden numbness or weakness of the face, arm, or leg, trouble speaking, confusion, trouble walking, loss of  balance or coordination, dizziness, severe headache, change in vision Sudden and severe headache, confusion, change in vision, seizures, which may be signs of posterior reversible encephalopathy syndrome (PRES) Side effects that usually do not require medical attention (report to your care team if they continue or are bothersome): Back pain Change in taste Diarrhea Dry skin Increased tears Nosebleed This list may not describe all possible side effects. Call your doctor for medical advice about side effects. You may report side effects to FDA at 1-800-FDA-1088. Where should I keep my medication? This medication is given in a hospital or clinic. It will not be stored at home. NOTE: This sheet is a summary. It may not cover all possible information. If you have questions about this medicine, talk to your doctor, pharmacist, or health care provider.  2024 Elsevier/Gold Standard (2022-04-28 00:00:00)  Leucovorin  Injection What is this medication? LEUCOVORIN  (loo koe VOR in) prevents side effects from certain medications, such as methotrexate. It works by increasing folate levels. This helps protect healthy cells in your body. It may also be used to treat anemia caused by low levels of folate. It can also be used with fluorouracil , a type of chemotherapy, to treat colorectal cancer. It works by increasing the effects of fluorouracil  in the body. This medicine may be used for other purposes; ask your health care provider or pharmacist if you have questions. What should I tell my care team before I take this medication? They need to know if you have any of these conditions: Anemia from low levels of vitamin B12 in the blood An unusual or allergic reaction to leucovorin , folic acid , other medications, foods, dyes, or preservatives Pregnant or trying to get pregnant Breastfeeding How should I use this medication? This medication is injected into a vein or a muscle. It is given by your care team in a  hospital or clinic setting. Talk to your care team about the use of this medication in children. Special care may be needed. Overdosage: If you think you have taken too much of this medicine contact a poison control center or emergency room at once. NOTE: This medicine is only for you. Do not share this medicine with others. What if I miss a dose? Keep appointments for follow-up doses. It is important not to miss your dose. Call your care team if you are unable to keep an appointment. What may interact with this medication? Capecitabine  Fluorouracil  Phenobarbital Phenytoin Primidone Trimethoprim ;sulfamethoxazole This list may not describe all possible interactions. Give your health care provider a list of all the medicines, herbs, non-prescription drugs, or dietary supplements you use. Also tell them if you smoke, drink alcohol, or use illegal drugs. Some items may interact with your medicine. What should I watch for while using this medication? Your condition will be monitored carefully while you are receiving this medication. This medication may increase the side effects of 5-fluorouracil . Tell your care team if you have diarrhea  or mouth sores that do not get better or that get worse. What side effects may I notice from receiving this medication? Side effects that you should report to your care team as soon as possible: Allergic reactions--skin rash, itching, hives, swelling of the face, lips, tongue, or throat This list may not describe all possible side effects. Call your doctor for medical advice about side effects. You may report side effects to FDA at 1-800-FDA-1088. Where should I keep my medication? This medication is given in a hospital or clinic. It will not be stored at home. NOTE: This sheet is a summary. It may not cover all possible information. If you have questions about this medicine, talk to your doctor, pharmacist, or health care provider.  2024 Elsevier/Gold Standard  (2022-05-16 00:00:00)

## 2024-04-21 NOTE — Assessment & Plan Note (Addendum)
 History of Stage III. pT3 pN1b cM0, s/p APR. 05/2023 Stage IV current rectal adenocarcinoma Currently on adjuvant chemotherapy. S/p FOLFOX x 4 cycles S/p concurrent chemotherapy [Xeloda  1300 mg BID] with RT, and then another 4 cycles of adjuvant FOLFOX [dose reduced oxaliplatin  and omit 5-FU bolus 05/2023 CT imaging indicates disease progression. liver biopsy pathology showed metastatic carcinoma--> FOLFIRI + Bevacizumab    NGS showed TP53 missense variant, APC frameshift, TCF7L2 Frameshift, FLT3 copy number gain., TMB 4.7, pMMR Wildtype KRAS/NRAS Labs are reviewed and discussed with patient. Proceed with  FOLFIRI + Bevacizumab 

## 2024-04-21 NOTE — Assessment & Plan Note (Signed)
Nutritionist followup.

## 2024-04-21 NOTE — Assessment & Plan Note (Signed)
 Chemotherapy plan as listed above

## 2024-04-21 NOTE — Progress Notes (Signed)
 Hematology/Oncology Progress note [phone] [phone]      CHIEF COMPLAINTS/REASON FOR VISIT:  Follow-up for Stage IV  rectal cancer treatments.  ASSESSMENT & PLAN:   Cancer Staging  Rectal cancer [facility]) Staging form: Colon and Rectum, AJCC 8th Edition - Pathologic stage from 05/03/2022: Stage IIIB (pT3, pN1b, cM0) - Signed by Timmy Forbes, MD on 05/03/2022 - Pathologic stage from 06/18/2023: Stage IVA (rpTX, pN0, pM1a) - Signed by Timmy Forbes, MD on 06/18/2023   Rectal cancer [facility]) History of Stage III. pT3 pN1b cM0, s/p APR. 05/2023 Stage IV current rectal adenocarcinoma Currently on adjuvant chemotherapy. S/p FOLFOX x 4 cycles S/p concurrent chemotherapy [Xeloda  1300 mg BID] with RT, and then another 4 cycles of adjuvant FOLFOX [dose reduced oxaliplatin  and omit 5-FU bolus 05/2023 CT imaging indicates disease progression. liver biopsy pathology showed metastatic carcinoma--> FOLFIRI + Bevacizumab    NGS showed TP53 missense variant, APC frameshift, TCF7L2 Frameshift, FLT3 copy number gain., TMB 4.7, pMMR Wildtype KRAS/NRAS Labs are reviewed and discussed with patient. Proceed with  FOLFIRI + Bevacizumab      Chemotherapy-induced neuropathy (HCC) Grade 2  Continue gabapentin  300mg  BID.   Encounter for antineoplastic chemotherapy Chemotherapy plan as listed above  Weight loss Nutritionist follow up      No orders of the defined types were placed in this encounter.   Follow-up 2 week(s)  All questions were answered. The patient knows to call the clinic with any problems, questions or concerns.  Timmy Forbes, MD, PhD [facility] Hematology Oncology 04/21/2024     HISTORY OF PRESENTING ILLNESS:   April Luna is a  53 y.o.  female presents for treatment of rectal cancer Oncology History  Rectal cancer (HCC)  02/26/2022 Imaging   MRI PELVIS WITHOUT CONTRAST- By imaging, rectal cancer stage:  T1/T2, N0, Mx    03/02/2022 Imaging   CT CHEST AND  ABDOMEN WITH CONTRAST 1. No convincing evidence of metastatic disease within the chest or abdomen. 2. Atrophic left kidney with multifocal renal scarring and cortical calcifications as well as nonobstructive renal stones measuring up to 5 mm. 3. Prominent left-sided predominant retroperitoneal lymph nodes measuring up to 8 mm near the level of the renal hilum, overall decreased in size dating back to CT September 19, 2018 and favored reactive related to left renal inflammation. 4.  Aortic Atherosclerosis (ICD10-I70.0).   03/27/2022 Genetic Testing    [facility] [employer]+RNA cancer panel found no pathogenic mutations.    04/21/2022 Initial Diagnosis   Rectal cancer - baseline CEA 3.6 -02/09/2022, patient had colonoscopy which showed renal mass 10 cm from anal verge.  5 mm polyp in ascending colon.  Removed and retrieved. Pathology showed rectal adenocarcinoma.  The polyp in the ascending colon is a tubular adenoma.  MRI showed cT1/T2N0 disease  -04/21/2022, patient underwent robotic assisted ultralow anterior resection. Pathology showed moderately differentiated adenocarcinoma, 4.5 cm in maximal extent, with focal extension through muscularis propria into perirectal soft tissue.  3 lymph nodes positive for metastatic carcinoma.  Negative margin.  pT3 pN1b, MSI stable.   05/03/2022 Cancer Staging   Staging form: Colon and Rectum, AJCC 8th Edition - Pathologic stage from 05/03/2022: Stage IIIB (pT3, pN1b, cM0) - Signed by Timmy Forbes, MD on 05/03/2022 Stage prefix: Initial diagnosis   05/11/2022 Miscellaneous   Medi port placed by Dr.White   05/26/2022 -  Chemotherapy   FOLFOX Q2 weeks x 4   05/26/2022 - 07/09/2022 Chemotherapy   Patient is on Treatment Plan : COLORECTAL FOLFOX q14d  x 8 cycles     05/26/2022 - 12/13/2022 Chemotherapy   Patient is on Treatment Plan : COLORECTAL FOLFOX q14d x 4 months     07/27/2022 -  Chemotherapy   Concurrent chemotherapy- Xeloda   1300mg  BID and radiation.    07/27/2022 -  09/12/2022 Radiation Therapy    concurrent chemotherapy [Xeloda  1300 mg BID] with RT    09/20/2022 Imaging   CT Angiogram chest PET protocol There is no evidence of pulmonary artery embolism. There is no evidence of thoracic aortic dissection.   Small linear patchy alveolar infiltrate is seen in medial segment of right middle lobe suggesting atelectasis/pneumonia.     09/28/2022 - 09/30/2022 Hospital Admission   Admission due to acute respiratory failure with hypoxia, COPD exacerbation   09/28/2022 Imaging   CT angio chest PE protocol 1. Negative for acute PE or thoracic aortic dissection. 2. New ground-glass infiltrates anteriorly in bilateral upper lobes,may represent atypical edema, infectious or inflammatory process. 3.  Aortic Atherosclerosis     12/22/2022 Imaging   CT chest abdomen pelvis w contrast  Stable exam. No evidence of recurrent or metastatic carcinoma within the chest, abdomen, or pelvis.   Left nephrolithiasis and renal parenchymal atrophy. No evidence of ureteral calculi or hydronephrosis.   Aortic Atherosclerosis   05/31/2023 Imaging   CT chest abdomen pelvis with contrast showed 1. New hypodense 16 mm lesion in the right lobe of the liver with very subtle adjacent 5 mm lesion, suspicious for metastatic disease. 2. Small bilateral pulmonary nodules, some of which were subtly evident on prior examination only in retrospect, some of which demonstrate degree of cavitation, suspicious for metastatic disease. 3. Prominent retroperitoneal lymph nodes are similar prior. 4. Prior low anterior resection with Hartmann's pouch formation,similar small amount of perirectal/presacral soft tissue and fluid,favored postsurgical/posttreatment change. Suggest continued attention on follow-up imaging. 5. Large volume of formed stool in the colon. 6. Nonobstructive left renal calculi measure under 1 cm.     06/08/2023 Relapse/Recurrence   Ultrasound-guided liver biopsy  showed Pathology showed metastatic carcinoma, morphology consistent with patient's clinical history of rectal adenocarcinoma.  [facility] NGS 648 gene panel showed TP53 missense variant, APC frameshift, TCF7L2 frameshift, FLT3 copy number gain, TMB 4.34m/MB MSI stable.  Wildtype KRAS/NRAS  [facility] RNA Seq - ERBB3 overexpressed, VEGFA overexpressed, NRAS overexpressed.     06/18/2023 Cancer Staging   Staging form: Colon and Rectum, AJCC 8th Edition - Pathologic stage from 06/18/2023: Stage IVA (rpTX, pN0, pM1a) - Signed by Timmy Forbes, MD on 06/18/2023 Stage prefix: Recurrence Total positive nodes: 0   06/25/2023 - 06/25/2023 Chemotherapy   Patient is on Treatment Plan : COLORECTAL FOLFOX + Bevacizumab  q14d     07/02/2023 -  Chemotherapy   Patient is on Treatment Plan : COLORECTAL FOLFIRI + Bevacizumab  q14d     11/13/2023 Imaging   CT chest abdomen pelvis w contrast showed  1. Increase in cavitation with decreased soft tissue involving the scattered bilateral pulmonary nodules, consistent with treatment response. No new suspicious pulmonary nodules or masses. 2. Decrease in size and conspicuity of the hypodense lesions in the right lobe of the liver, consistent with treatment response. No new suspicious hepatic lesion identified. 3. Stable prominent nonspecific retroperitoneal lymph nodes. Suggest continued attention on follow-up imaging. 4. Similar surgical changes of prior low anterior resection with left anterior abdominal wall colostomy. Similar mesorectal/presacral soft tissue, no new suspicious enhancing nodularity. 5. Similar soft tissue nodularity in the bilateral posterior gluteal subcutaneous soft tissues. 6. Moderate volume  of formed stool in the colon. Correlate for constipation.   03/03/2024 Imaging   CT chest abdomen pelvis w contrast showed 1. 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 2. No new or progressive disease. 3. Similar prominent but not  pathologically sized abdominal retroperitoneal nodes, nonspecific. 4. Status post low anterior resection with descending colostomy. Possible constipation. 5. Age advanced coronary artery atherosclerosis. Recommend assessment of coronary risk factors. 6.  Aortic Atherosclerosis (ICD10-I70.0). 7. Left nephrolithiasis and renal scarring.    Patient has bipolar and schizophrenia..  Patient is married  She has housing issue due to previous history of eviction.  she does not live with her husband currently. She lives with some family members.   + chronic dysuria and urgency she was seen by Urology and was started on Gemteza.  Symptoms are improved.    INTERVAL HISTORY April Luna is a 53 y.o. female who has above history reviewed by me today presents for follow up visit for rectal cancer.   Denies any melena or blood in the stool. Stable neuropathy symptoms. +decreased appetite. Weight is stable. Lost food in fridge due to power outrage for 2 days.   Review of Systems  Constitutional:  Positive for fatigue. Negative for appetite change, chills and fever.  HENT:   Negative for hearing loss, mouth sores and voice change.   Eyes:  Negative for eye problems.  Respiratory:  Negative for chest tightness, cough and shortness of breath.   Cardiovascular:  Negative for chest pain.  Gastrointestinal:  Negative for abdominal distention, abdominal pain, blood in stool, diarrhea and nausea.       + colostomy   Endocrine: Negative for hot flashes.  Genitourinary:  Negative for difficulty urinating, dysuria and frequency.   Musculoskeletal:  Negative for arthralgias.  Skin:  Negative for itching and rash.  Neurological:  Positive for numbness. Negative for extremity weakness.  Hematological:  Negative for adenopathy.  Psychiatric/Behavioral:  Negative for confusion.     MEDICAL HISTORY:  Past Medical History:  Diagnosis Date   Allergy    pollen   Anxiety    Arthritis    right hip    Bipolar 1 disorder (HCC)    Cancer (HCC)    rectal   Chemotherapy-induced neuropathy (HCC) 10/24/2022   Chronic kidney disease    COPD (chronic obstructive pulmonary disease) (HCC)    Depression    Family history of adverse reaction to anesthesia    grand father had a stroke during anesthesia   Family history of breast cancer    Family history of colon cancer    Family history of uterine cancer    GERD (gastroesophageal reflux disease)    History of kidney stones    Hyperlipidemia    Hypertension    Hypothyroidism    Panic attack    Pneumonia    Psoriasis    Sleep apnea 08/11/2021   No CPAP   Type 2 diabetes mellitus with microalbuminuria, without long-term current use of insulin  (HCC) 06/24/2019    SURGICAL HISTORY: Past Surgical History:  Procedure Laterality Date   BREAST BIOPSY Left 12/14/2021   us  bx, venus marker, path pending   CESAREAN SECTION     COLONOSCOPY WITH PROPOFOL  N/A 02/09/2022   Procedure: COLONOSCOPY WITH PROPOFOL ;  Surgeon: Selena Daily, MD;  Location: [facility];  Service: Endoscopy;  Laterality: N/A;  sleep apnea   CYSTOSCOPY W/ RETROGRADES Left 11/08/2018   Procedure: CYSTOSCOPY WITH RETROGRADE PYELOGRAM;  Surgeon: Jay Meth  C, MD;  Location: [facility];  Service: Urology;  Laterality: Left;   CYSTOSCOPY/URETEROSCOPY/HOLMIUM LASER/STENT PLACEMENT Left 11/08/2018   Procedure: CYSTOSCOPY/URETEROSCOPY/HOLMIUM LASER/STENT PLACEMENT;  Surgeon: Lawerence Pressman, MD;  Location: [facility];  Service: Urology;  Laterality: Left;   IR CV LINE INJECTION  06/25/2023   IR IMAGING GUIDED PORT INSERTION  05/11/2022   IR REMOVE CV FIBRIN SHEATH  06/27/2023   MOUTH SURGERY     wisdom teeth extraction   MOUTH SURGERY     teeth removal   POLYPECTOMY N/A 02/09/2022   Procedure: POLYPECTOMY;  Surgeon: Selena Daily, MD;  Location: [facility];  Service: Endoscopy;  Laterality: N/A;   XI ROBOTIC ASSISTED LOWER ANTERIOR RESECTION N/A  04/21/2022   Procedure: XI ROBOTIC ASSISTED LOWER ANTERIOR RESECTION WITH COLOSTOMY, BILATERAL TAP BLOCK, ASSESSMENT OF TISSUE PERFUSSION WITH FIREFLY INJECTION;  Surgeon: Melvenia Stabs, MD;  Location: [facility];  Service: General;  Laterality: N/A;    SOCIAL HISTORY: Social History   Socioeconomic History   Marital status: Married    Spouse name: Ava Lei   Number of children: 1   Years of education: 12   Highest education level: High school graduate  Occupational History   Occupation: unemployed    Comment: disabled  Tobacco Use   Smoking status: Some Days    Current packs/day: 0.25    Average packs/day: 0.3 packs/day for 38.9 years (9.7 ttl pk-yrs)    Types: Cigarettes    Start date: 05/13/1985    Passive exposure: Current   Smokeless tobacco: Never   Tobacco comments:    5-6 cigarettes weekly- 11/07/2022  Vaping Use   Vaping status: Former   Start date: 05/25/2018   Quit date: 09/24/2018  Substance and Sexual Activity   Alcohol use: Not Currently    Alcohol/week: 4.0 standard drinks of alcohol    Types: 4 Glasses of wine per week    Comment: quit ETOH in Feb. 2023   Drug use: Yes    Types: Marijuana    Comment: 2 days ago   Sexual activity: Not Currently    Birth control/protection: Post-menopausal  Other Topics Concern   Not on file  Social History Narrative   Lives with husband and son    Social Drivers of Health   Financial Resource Strain: High Risk (08/30/2023)   Overall Financial Resource Strain (CARDIA)    Difficulty of Paying Living Expenses: Hard  Food Insecurity: Food Insecurity Present (08/30/2023)   Hunger Vital Sign    Worried About Running Out of Food in the Last Year: Sometimes true    Ran Out of Food in the Last Year: Sometimes true  Transportation Needs: No Transportation Needs (08/30/2023)   PRAPARE - Administrator, Civil Service (Medical): No    Lack of Transportation (Non-Medical): No  Physical Activity: Inactive  (08/30/2023)   Exercise Vital Sign    Days of Exercise per Week: 0 days    Minutes of Exercise per Session: 0 min  Stress: Stress Concern Present (08/30/2023)   [employer] of Occupational Health - Occupational Stress Questionnaire    Feeling of Stress : Very much  Social Connections: Moderately Integrated (08/30/2023)   Social Connection and Isolation Panel [NHANES]    Frequency of Communication with Friends and Family: Never    Frequency of Social Gatherings with Friends and Family: Once a week    Attends Religious Services: More than 4 times per year    Active Member of Clubs or  Organizations: No    Attends Banker Meetings: 1 to 4 times per year    Marital Status: Married  Catering manager Violence: Not At Risk (08/30/2023)   Humiliation, Afraid, Rape, and Kick questionnaire    Fear of Current or Ex-Partner: No    Emotionally Abused: No    Physically Abused: No    Sexually Abused: No    FAMILY HISTORY: Family History  Problem Relation Age of Onset   Depression Mother    Anxiety disorder Mother    Diabetes Mother    Hypertension Mother    Hyperlipidemia Mother    Cancer Mother    Uterine cancer Mother 63   Cervical cancer Mother 28   Colon cancer Father    Depression Brother    Anxiety disorder Brother    Cancer Maternal Aunt        unk types   Diabetes Mellitus II Maternal Grandmother    Hypercholesterolemia Maternal Grandmother    Breast cancer Maternal Grandmother    Cancer Paternal Grandmother    Diabetes Paternal Grandmother    Melanoma Paternal Grandmother    Stomach cancer Paternal Grandmother     ALLERGIES:  is allergic to metformin and related, nsaids, perphenazine, sulfa antibiotics, abilify [aripiprazole], and penicillins.  MEDICATIONS:  Current Outpatient Medications  Medication Sig Dispense Refill   acetaminophen  (TYLENOL ) 325 MG tablet Take 650 mg by mouth every 6 (six) hours as needed for headache (pain).     albuterol  (VENTOLIN  HFA)  108 (90 Base) MCG/ACT inhaler Inhale 2 puffs into the lungs every 6 (six) hours as needed for wheezing or shortness of breath. 1 each 5   blood glucose meter kit and supplies Dispense based on patient and insurance preference. Use up to four times daily as directed. (FOR ICD-10 E10.9, E11.9). 1 each 0   Budeson-Glycopyrrol-Formoterol  (BREZTRI  AEROSPHERE) 160-9-4.8 MCG/ACT AERO Inhale 2 puffs into the lungs in the morning and at bedtime. 10.7 g 2   buPROPion  (WELLBUTRIN  XL) 300 MG 24 hr tablet Take 300 mg by mouth daily.     Cholecalciferol  (VITAMIN D -3) 125 MCG (5000 UT) TABS Take 5,000 Units by mouth daily. 30 tablet 1   clonazePAM (KLONOPIN) 0.5 MG tablet Take 1 mg by mouth 2 (two) times daily as needed.     dexamethasone  (DECADRON ) 4 MG tablet Take 2 tablets (8 mg total) by mouth See admin instructions. Take 8mg  daily for 2 days after each chemotherapy. 30 tablet 1   diltiazem  (CARDIZEM  CD) 120 MG 24 hr capsule TAKE 1 CAPSULE BY MOUTH DAILY 90 capsule 1   docusate sodium  (COLACE) 100 MG capsule TAKE 1 CAPSULE BY MOUTH TWICE DAILY 60 capsule 2   FARXIGA  10 MG TABS tablet TAKE 1 TABLET BY MOUTH DAILY BEFORE BREAKFAST 90 tablet 0   gabapentin  (NEURONTIN ) 300 MG capsule Take 300 mg by mouth 2 (two) times daily.     levothyroxine  (SYNTHROID ) 75 MCG tablet TAKE 1 TABLET BY MOUTH DAILY BEFORE BREAKFAST 90 tablet 2   lidocaine -prilocaine  (EMLA ) cream Apply to affected area once 30 g 3   loperamide  (IMODIUM ) 2 MG capsule Take 1 capsule (2 mg total) by mouth See admin instructions. Initial: 4 mg,the 2 mg every 2 hours (4 mg every 4 hours at night)  maximum: 16 mg/day 120 capsule 2   loratadine  (CLARITIN ) 10 MG tablet Take 10 mg by mouth every morning.     magic mouthwash w/lidocaine  SOLN Take 5 mLs by mouth 4 (four) times daily as needed  for mouth pain. Sig: Swish/Swallow 5-10 ml four times a day as needed. Dispense 480 ml. 1RF 480 mL 1   ondansetron  (ZOFRAN ) 8 MG tablet Take 1 tablet (8 mg total) by mouth  every 8 (eight) hours as needed for nausea, vomiting or refractory nausea / vomiting. Start on the third day after chemotherapy. 30 tablet 1   paliperidone  (INVEGA  SUSTENNA) 234 MG/1.5ML SUSY injection Inject 234 mg into the muscle every 30 (thirty) days. On or about the 14th of each month     pantoprazole  (PROTONIX ) 40 MG tablet TAKE 1 TABLET BY MOUTH DAILY 90 tablet 2   pioglitazone  (ACTOS ) 15 MG tablet Take 1 tablet (15 mg total) by mouth daily. 90 tablet 1   promethazine  (PHENERGAN ) 12.5 MG tablet Take 1 tablet (12.5 mg total) by mouth every 6 (six) hours as needed for nausea or vomiting. 30 tablet 0   rosuvastatin  (CRESTOR ) 40 MG tablet TAKE 1 TABLET BY MOUTH EVERY MORNING 90 tablet 0   Spacer/Aero-Holding Chambers (AEROCHAMBER MV) inhaler Use as instructed 1 each 0   Vibegron  (GEMTESA ) 75 MG TABS Take 1 tablet (75 mg total) by mouth daily.     amantadine  (SYMMETREL ) 100 MG capsule Take 100 mg by mouth 2 (two) times daily.     icosapent  Ethyl (VASCEPA ) 1 g capsule Take 2 capsules (2 g total) by mouth in the morning and at bedtime. 120 capsule 2   sertraline  (ZOLOFT ) 100 MG tablet Take 200 mg by mouth every morning. (Patient not taking: Reported on 04/21/2024)     No current facility-administered medications for this visit.   Facility-Administered Medications Ordered in Other Visits  Medication Dose Route Frequency Provider Last Rate Last Admin   albuterol  (PROVENTIL ) (2.5 MG/3ML) 0.083% nebulizer solution 2.5 mg  2.5 mg Nebulization Once Dgayli, Khabib, MD       fluorouracil  (ADRUCIL ) 3,850 mg in sodium chloride  0.9 % 73 mL chemo infusion  2,400 mg/m2 (Treatment Plan Recorded) Intravenous 1 day or 1 dose Timmy Forbes, MD       fluorouracil  (ADRUCIL ) chemo injection 650 mg  400 mg/m2 (Treatment Plan Recorded) Intravenous Once Analeigha Nauman, MD       irinotecan  (CAMPTOSAR ) 300 mg in sodium chloride  0.9 % 500 mL chemo infusion  180 mg/m2 (Treatment Plan Recorded) Intravenous Once Timmy Forbes, MD 343 mL/hr at  04/21/24 1122 300 mg at 04/21/24 1122   leucovorin  644 mg in sodium chloride  0.9 % 250 mL infusion  400 mg/m2 (Treatment Plan Recorded) Intravenous Once Timmy Forbes, MD 188 mL/hr at 04/21/24 1120 644 mg at 04/21/24 1120   sodium chloride  flush (NS) 0.9 % injection 10 mL  10 mL Intracatheter PRN Timmy Forbes, MD   10 mL at 08/15/23 1327   sodium chloride  flush (NS) 0.9 % injection 10 mL  10 mL Intracatheter PRN Timmy Forbes, MD   10 mL at 03/26/24 1255     PHYSICAL EXAMINATION: ECOG PERFORMANCE STATUS: 0 - Asymptomatic Vitals:   04/21/24 0854  BP: (!) 121/91  Pulse: 75  Resp: 19  Temp: (!) 97.3 F (36.3 C)  SpO2: 99%    Filed Weights   04/21/24 0854  Weight: 129 lb 3.2 oz (58.6 kg)     Physical Exam Constitutional:      General: She is not in acute distress. HENT:     Head: Normocephalic and atraumatic.     Mouth/Throat:     Comments: mucositis Eyes:     General: No scleral icterus. Cardiovascular:  Rate and Rhythm: Normal rate and regular rhythm.  Pulmonary:     Effort: Pulmonary effort is normal. No respiratory distress.     Breath sounds: No wheezing.     Comments: Decreased breath sound bilaterally Abdominal:     General: Bowel sounds are normal. There is no distension.     Palpations: Abdomen is soft.     Comments: +colostomy   Musculoskeletal:        General: No deformity. Normal range of motion.     Cervical back: Normal range of motion and neck supple.  Skin:    General: Skin is warm and dry.     Findings: No erythema or rash.  Neurological:     Mental Status: She is alert and oriented to person, place, and time. Mental status is at baseline.     Cranial Nerves: No cranial nerve deficit.     Coordination: Coordination normal.  Psychiatric:        Mood and Affect: Mood normal.     LABORATORY DATA:  I have reviewed the data as listed    Latest Ref Rng & Units 04/21/2024    8:15 AM 04/07/2024    7:57 AM 03/24/2024    8:40 AM  CBC  WBC 4.0 - 10.5 K/uL 3.0   5.5  4.0   Hemoglobin 12.0 - 15.0 g/dL 16.1  09.6  04.5   Hematocrit 36.0 - 46.0 % 39.7  42.0  43.4   Platelets 150 - 400 K/uL 144  125  153       Latest Ref Rng & Units 04/21/2024    8:15 AM 04/07/2024    7:57 AM 03/24/2024    8:40 AM  CMP  Glucose 70 - 99 mg/dL 409  811  914   BUN 6 - 20 mg/dL 13  21  16    Creatinine 0.44 - 1.00 mg/dL 7.82  9.56  2.13   Sodium 135 - 145 mmol/L 138  139  138   Potassium 3.5 - 5.1 mmol/L 3.6  4.0  3.9   Chloride 98 - 111 mmol/L 108  105  105   CO2 22 - 32 mmol/L 21  24  23    Calcium  8.9 - 10.3 mg/dL 8.5  8.9  9.0   Total Protein 6.5 - 8.1 g/dL 6.3  6.9  7.0   Total Bilirubin 0.0 - 1.2 mg/dL 0.2  0.4  0.4   Alkaline Phos 38 - 126 U/L 78  74  76   AST 15 - 41 U/L 17  19  19    ALT 0 - 44 U/L 13  13  14          RADIOGRAPHIC STUDIES: I have personally reviewed the radiological images as listed and agreed with the findings in the report. No results found.

## 2024-04-22 LAB — CEA: CEA: 3.9 ng/mL (ref 0.0–4.7)

## 2024-04-23 ENCOUNTER — Inpatient Hospital Stay

## 2024-04-23 VITALS — BP 134/85 | HR 75 | Resp 18

## 2024-04-23 DIAGNOSIS — C20 Malignant neoplasm of rectum: Secondary | ICD-10-CM | POA: Diagnosis not present

## 2024-04-23 MED ORDER — HEPARIN SOD (PORK) LOCK FLUSH 100 UNIT/ML IV SOLN
500.0000 [IU] | Freq: Once | INTRAVENOUS | Status: AC | PRN
  Administered 2024-04-23: 500 [IU]
  Filled 2024-04-23: qty 5

## 2024-04-23 MED ORDER — SODIUM CHLORIDE 0.9% FLUSH
10.0000 mL | INTRAVENOUS | Status: DC | PRN
  Administered 2024-04-23: 10 mL
  Filled 2024-04-23: qty 10

## 2024-04-23 NOTE — Progress Notes (Signed)
 Nutrition Follow-up:  Patient with stage II adenocarcinoma of rectum.  Patient receiving folfiri and bevacizumab .    Met with patient following pump removal.  Reports that appetite is so-so.  Has little money to purchase food until 5/2 when food stamps kick back in.  Received food bag from cancer center at the beginning of the week.  Reports being concerned about food choices and blood sugar.  Only had pop tart and sweet tea available this am to eat.  Reports eating a chili dog recently and then having stomach upset (gas).      Medications: reviewed  Labs: reviewed  Anthropometrics:   Weight 129 lb 3.2 oz on 4/28 132 lb 11.2 oz on 11/10/22 (last seen by RD)  Weight relatively stable  NUTRITION DIAGNOSIS: Inadequate oral intake stable    INTERVENTION:  Encouraged patient to make sure she is taking DM medication.  Discussed lower sugar, economical food options.   Discussed foods to choose with excess gas, stomach upset.       MONITORING, EVALUATION, GOAL: weight trends, intake   NEXT VISIT: Thursday, May 29 following pump removal  Toriana Sponsel B. Zollie Hipp, CSO, LDN Registered Dietitian [phone]

## 2024-04-25 ENCOUNTER — Other Ambulatory Visit: Payer: Self-pay | Admitting: Oncology

## 2024-04-25 ENCOUNTER — Other Ambulatory Visit: Payer: Self-pay | Admitting: Family Medicine

## 2024-04-25 DIAGNOSIS — E1122 Type 2 diabetes mellitus with diabetic chronic kidney disease: Secondary | ICD-10-CM

## 2024-04-25 DIAGNOSIS — R000 Tachycardia, unspecified: Secondary | ICD-10-CM

## 2024-04-25 DIAGNOSIS — I10 Essential (primary) hypertension: Secondary | ICD-10-CM

## 2024-04-28 ENCOUNTER — Encounter: Payer: Self-pay | Admitting: Oncology

## 2024-04-28 ENCOUNTER — Encounter: Payer: Self-pay | Admitting: Nurse Practitioner

## 2024-05-05 ENCOUNTER — Inpatient Hospital Stay

## 2024-05-05 ENCOUNTER — Ambulatory Visit: Payer: 59 | Admitting: Family Medicine

## 2024-05-05 ENCOUNTER — Encounter: Payer: Self-pay | Admitting: Oncology

## 2024-05-05 ENCOUNTER — Inpatient Hospital Stay (HOSPITAL_BASED_OUTPATIENT_CLINIC_OR_DEPARTMENT_OTHER): Admitting: Oncology

## 2024-05-05 ENCOUNTER — Inpatient Hospital Stay: Attending: Oncology

## 2024-05-05 VITALS — BP 126/88 | HR 84 | Temp 97.4°F | Resp 18 | Wt 125.3 lb

## 2024-05-05 VITALS — BP 139/89 | HR 87 | Resp 16

## 2024-05-05 DIAGNOSIS — I2510 Atherosclerotic heart disease of native coronary artery without angina pectoris: Secondary | ICD-10-CM | POA: Insufficient documentation

## 2024-05-05 DIAGNOSIS — Z87442 Personal history of urinary calculi: Secondary | ICD-10-CM | POA: Diagnosis not present

## 2024-05-05 DIAGNOSIS — C787 Secondary malignant neoplasm of liver and intrahepatic bile duct: Secondary | ICD-10-CM | POA: Diagnosis not present

## 2024-05-05 DIAGNOSIS — F17210 Nicotine dependence, cigarettes, uncomplicated: Secondary | ICD-10-CM | POA: Diagnosis not present

## 2024-05-05 DIAGNOSIS — Z800 Family history of malignant neoplasm of digestive organs: Secondary | ICD-10-CM | POA: Insufficient documentation

## 2024-05-05 DIAGNOSIS — F209 Schizophrenia, unspecified: Secondary | ICD-10-CM | POA: Diagnosis not present

## 2024-05-05 DIAGNOSIS — G4730 Sleep apnea, unspecified: Secondary | ICD-10-CM | POA: Diagnosis not present

## 2024-05-05 DIAGNOSIS — F319 Bipolar disorder, unspecified: Secondary | ICD-10-CM | POA: Insufficient documentation

## 2024-05-05 DIAGNOSIS — C20 Malignant neoplasm of rectum: Secondary | ICD-10-CM

## 2024-05-05 DIAGNOSIS — I700 Atherosclerosis of aorta: Secondary | ICD-10-CM | POA: Diagnosis not present

## 2024-05-05 DIAGNOSIS — E1122 Type 2 diabetes mellitus with diabetic chronic kidney disease: Secondary | ICD-10-CM | POA: Insufficient documentation

## 2024-05-05 DIAGNOSIS — J449 Chronic obstructive pulmonary disease, unspecified: Secondary | ICD-10-CM | POA: Insufficient documentation

## 2024-05-05 DIAGNOSIS — Z923 Personal history of irradiation: Secondary | ICD-10-CM | POA: Insufficient documentation

## 2024-05-05 DIAGNOSIS — E876 Hypokalemia: Secondary | ICD-10-CM | POA: Diagnosis not present

## 2024-05-05 DIAGNOSIS — E1140 Type 2 diabetes mellitus with diabetic neuropathy, unspecified: Secondary | ICD-10-CM | POA: Insufficient documentation

## 2024-05-05 DIAGNOSIS — Z7984 Long term (current) use of oral hypoglycemic drugs: Secondary | ICD-10-CM | POA: Insufficient documentation

## 2024-05-05 DIAGNOSIS — I10 Essential (primary) hypertension: Secondary | ICD-10-CM | POA: Diagnosis not present

## 2024-05-05 DIAGNOSIS — Z803 Family history of malignant neoplasm of breast: Secondary | ICD-10-CM | POA: Insufficient documentation

## 2024-05-05 DIAGNOSIS — Z7952 Long term (current) use of systemic steroids: Secondary | ICD-10-CM | POA: Diagnosis not present

## 2024-05-05 DIAGNOSIS — Z79899 Other long term (current) drug therapy: Secondary | ICD-10-CM | POA: Insufficient documentation

## 2024-05-05 DIAGNOSIS — N200 Calculus of kidney: Secondary | ICD-10-CM | POA: Diagnosis not present

## 2024-05-05 DIAGNOSIS — R197 Diarrhea, unspecified: Secondary | ICD-10-CM | POA: Insufficient documentation

## 2024-05-05 DIAGNOSIS — D122 Benign neoplasm of ascending colon: Secondary | ICD-10-CM | POA: Insufficient documentation

## 2024-05-05 DIAGNOSIS — Z79890 Hormone replacement therapy (postmenopausal): Secondary | ICD-10-CM | POA: Insufficient documentation

## 2024-05-05 DIAGNOSIS — E785 Hyperlipidemia, unspecified: Secondary | ICD-10-CM | POA: Insufficient documentation

## 2024-05-05 DIAGNOSIS — Z5111 Encounter for antineoplastic chemotherapy: Secondary | ICD-10-CM | POA: Insufficient documentation

## 2024-05-05 DIAGNOSIS — E039 Hypothyroidism, unspecified: Secondary | ICD-10-CM | POA: Insufficient documentation

## 2024-05-05 LAB — CBC WITH DIFFERENTIAL (CANCER CENTER ONLY)
Abs Immature Granulocytes: 0.01 10*3/uL (ref 0.00–0.07)
Basophils Absolute: 0 10*3/uL (ref 0.0–0.1)
Basophils Relative: 1 %
Eosinophils Absolute: 0.1 10*3/uL (ref 0.0–0.5)
Eosinophils Relative: 2 %
HCT: 40 % (ref 36.0–46.0)
Hemoglobin: 13.5 g/dL (ref 12.0–15.0)
Immature Granulocytes: 0 %
Lymphocytes Relative: 21 %
Lymphs Abs: 0.7 10*3/uL (ref 0.7–4.0)
MCH: 33.8 pg (ref 26.0–34.0)
MCHC: 33.8 g/dL (ref 30.0–36.0)
MCV: 100.3 fL — ABNORMAL HIGH (ref 80.0–100.0)
Monocytes Absolute: 0.5 10*3/uL (ref 0.1–1.0)
Monocytes Relative: 15 %
Neutro Abs: 2.1 10*3/uL (ref 1.7–7.7)
Neutrophils Relative %: 61 %
Platelet Count: 140 10*3/uL — ABNORMAL LOW (ref 150–400)
RBC: 3.99 MIL/uL (ref 3.87–5.11)
RDW: 14.6 % (ref 11.5–15.5)
WBC Count: 3.4 10*3/uL — ABNORMAL LOW (ref 4.0–10.5)
nRBC: 0 % (ref 0.0–0.2)

## 2024-05-05 LAB — CMP (CANCER CENTER ONLY)
ALT: 17 U/L (ref 0–44)
AST: 20 U/L (ref 15–41)
Albumin: 3.7 g/dL (ref 3.5–5.0)
Alkaline Phosphatase: 75 U/L (ref 38–126)
Anion gap: 9 (ref 5–15)
BUN: 17 mg/dL (ref 6–20)
CO2: 24 mmol/L (ref 22–32)
Calcium: 9.1 mg/dL (ref 8.9–10.3)
Chloride: 105 mmol/L (ref 98–111)
Creatinine: 0.93 mg/dL (ref 0.44–1.00)
GFR, Estimated: >60 mL/min (ref >60)
Glucose, Bld: 138 mg/dL — ABNORMAL HIGH (ref 70–99)
Potassium: 4 mmol/L (ref 3.5–5.1)
Sodium: 138 mmol/L (ref 135–145)
Total Bilirubin: 0.4 mg/dL (ref 0.0–1.2)
Total Protein: 6.5 g/dL (ref 6.5–8.1)

## 2024-05-05 LAB — PROTEIN, URINE, RANDOM: Total Protein, Urine: 15 mg/dL

## 2024-05-05 MED ORDER — SODIUM CHLORIDE 0.9 % IV SOLN
180.0000 mg/m2 | Freq: Once | INTRAVENOUS | Status: AC
  Administered 2024-05-05: 300 mg via INTRAVENOUS
  Filled 2024-05-05: qty 15

## 2024-05-05 MED ORDER — PALONOSETRON HCL INJECTION 0.25 MG/5ML
0.2500 mg | Freq: Once | INTRAVENOUS | Status: AC
  Administered 2024-05-05: 0.25 mg via INTRAVENOUS
  Filled 2024-05-05: qty 5

## 2024-05-05 MED ORDER — LEUCOVORIN CALCIUM INJECTION 350 MG
400.0000 mg/m2 | Freq: Once | INTRAMUSCULAR | Status: AC
  Administered 2024-05-05: 644 mg via INTRAVENOUS
  Filled 2024-05-05 (×2): qty 32.2

## 2024-05-05 MED ORDER — BEVACIZUMAB-AWWB CHEMO INJECTION 400 MG/16ML
5.0000 mg/kg | Freq: Once | INTRAVENOUS | Status: AC
  Administered 2024-05-05: 300 mg via INTRAVENOUS
  Filled 2024-05-05: qty 12

## 2024-05-05 MED ORDER — SODIUM CHLORIDE 0.9 % IV SOLN
2400.0000 mg/m2 | INTRAVENOUS | Status: DC
  Administered 2024-05-05: 3850 mg via INTRAVENOUS
  Filled 2024-05-05: qty 77

## 2024-05-05 MED ORDER — DEXAMETHASONE SODIUM PHOSPHATE 10 MG/ML IJ SOLN
10.0000 mg | Freq: Once | INTRAMUSCULAR | Status: AC
  Administered 2024-05-05: 10 mg via INTRAVENOUS
  Filled 2024-05-05: qty 1

## 2024-05-05 MED ORDER — FLUOROURACIL CHEMO INJECTION 2.5 GM/50ML
400.0000 mg/m2 | Freq: Once | INTRAVENOUS | Status: AC
  Administered 2024-05-05: 650 mg via INTRAVENOUS
  Filled 2024-05-05: qty 13

## 2024-05-05 MED ORDER — SODIUM CHLORIDE 0.9 % IV SOLN
Freq: Once | INTRAVENOUS | Status: AC
  Filled 2024-05-05: qty 250

## 2024-05-05 MED ORDER — ATROPINE SULFATE 1 MG/ML IV SOLN
0.5000 mg | Freq: Once | INTRAVENOUS | Status: AC
  Administered 2024-05-05: 0.5 mg via INTRAVENOUS
  Filled 2024-05-05: qty 1

## 2024-05-05 NOTE — Patient Instructions (Signed)
 [facility] - A DEPT OF [facility]  Discharge Instructions: Thank you for choosing [facility] to provide your oncology and hematology care.  If you have a lab appointment with the Cancer Center, please go directly to the Cancer Center and check in at the registration area.  Wear comfortable clothing and clothing appropriate for easy access to any Portacath or PICC line.   We strive to give you quality time with your provider. You may need to reschedule your appointment if you arrive late (15 or more minutes).  Arriving late affects you and other patients whose appointments are after yours.  Also, if you miss three or more appointments without notifying the office, you may be dismissed from the clinic at the provider's discretion.      For prescription refill requests, have your pharmacy contact our office and allow 72 hours for refills to be completed.    Today you received the following chemotherapy and/or immunotherapy agents MVASI , IRINOTECAN , LEUCOVORIN , 5 Fu      To help prevent nausea and vomiting after your treatment, we encourage you to take your nausea medication as directed.  BELOW ARE SYMPTOMS THAT SHOULD BE REPORTED IMMEDIATELY: *FEVER GREATER THAN 100.4 F (38 C) OR HIGHER *CHILLS OR SWEATING *NAUSEA AND VOMITING THAT IS NOT CONTROLLED WITH YOUR NAUSEA MEDICATION *UNUSUAL SHORTNESS OF BREATH *UNUSUAL BRUISING OR BLEEDING *URINARY PROBLEMS (pain or burning when urinating, or frequent urination) *BOWEL PROBLEMS (unusual diarrhea, constipation, pain near the anus) TENDERNESS IN MOUTH AND THROAT WITH OR WITHOUT PRESENCE OF ULCERS (sore throat, sores in mouth, or a toothache) UNUSUAL RASH, SWELLING OR PAIN  UNUSUAL VAGINAL DISCHARGE OR ITCHING   Items with * indicate a potential emergency and should be followed up as soon as possible or go to the Emergency Department if any problems should occur.  Please show the CHEMOTHERAPY  ALERT CARD or IMMUNOTHERAPY ALERT CARD at check-in to the Emergency Department and triage nurse.  Should you have questions after your visit or need to cancel or reschedule your appointment, please contact [facility] - A DEPT OF [facility]  [phone] and follow the prompts.  Office hours are 8:00 a.m. to 4:30 p.m. Monday - Friday. Please note that voicemails left after 4:00 p.m. may not be returned until the following business day.  We are closed weekends and major holidays. You have access to a nurse at all times for urgent questions. Please call the main number to the clinic [phone] and follow the prompts.  For any non-urgent questions, you may also contact your provider using MyChart. We now offer e-Visits for anyone 2 and older to request care online for non-urgent symptoms. For details visit [email or web address].   Also download the MyChart app! Go to the app store, search "MyChart", open the app, select [facility], and log in with your MyChart username and password.  Bevacizumab  Injection What is this medication? BEVACIZUMAB  (be va SIZ yoo mab) treats some types of cancer. It works by blocking a protein that causes cancer cells to grow and multiply. This helps to slow or stop the spread of cancer cells. It is a monoclonal antibody. This medicine may be used for other purposes; ask your health care provider or pharmacist if you have questions. COMMON BRAND NAME(S): Alymsys , Avastin , MVASI , Vegzalma, Zirabev  What should I tell my care team before I take this medication? They need to know if you have any  of these conditions: Blood clots Coughing up blood Having or recent surgery Heart failure High blood pressure History of a connection between 2 or more body parts that do not usually connect (fistula) History of a tear in your stomach or intestines Protein in your urine An unusual or allergic reaction to bevacizumab , other medications, foods,  dyes, or preservatives Pregnant or trying to get pregnant Breast-feeding How should I use this medication? This medication is injected into a vein. It is given by your care team in a hospital or clinic setting. Talk to your care team the use of this medication in children. Special care may be needed. Overdosage: If you think you have taken too much of this medicine contact a poison control center or emergency room at once. NOTE: This medicine is only for you. Do not share this medicine with others. What if I miss a dose? Keep appointments for follow-up doses. It is important not to miss your dose. Call your care team if you are unable to keep an appointment. What may interact with this medication? Interactions are not expected. This list may not describe all possible interactions. Give your health care provider a list of all the medicines, herbs, non-prescription drugs, or dietary supplements you use. Also tell them if you smoke, drink alcohol, or use illegal drugs. Some items may interact with your medicine. What should I watch for while using this medication? Your condition will be monitored carefully while you are receiving this medication. You may need blood work while taking this medication. This medication may make you feel generally unwell. This is not uncommon as chemotherapy can affect healthy cells as well as cancer cells. Report any side effects. Continue your course of treatment even though you feel ill unless your care team tells you to stop. This medication may increase your risk to bruise or bleed. Call your care team if you notice any unusual bleeding. Before having surgery, talk to your care team to make sure it is ok. This medication can increase the risk of poor healing of your surgical site or wound. You will need to stop this medication for 28 days before surgery. After surgery, wait at least 28 days before restarting this medication. Make sure the surgical site or wound is  healed enough before restarting this medication. Talk to your care team if questions. Talk to your care team if you may be pregnant. Serious birth defects can occur if you take this medication during pregnancy and for 6 months after the last dose. Contraception is recommended while taking this medication and for 6 months after the last dose. Your care team can help you find the option that works for you. Do not breastfeed while taking this medication and for 6 months after the last dose. This medication can cause infertility. Talk to your care team if you are concerned about your fertility. What side effects may I notice from receiving this medication? Side effects that you should report to your care team as soon as possible: Allergic reactions--skin rash, itching, hives, swelling of the face, lips, tongue, or throat Bleeding--bloody or black, tar-like stools, vomiting blood or brown material that looks like coffee grounds, red or dark brown urine, small red or purple spots on skin, unusual bruising or bleeding Blood clot--pain, swelling, or warmth in the leg, shortness of breath, chest pain Heart attack--pain or tightness in the chest, shoulders, arms, or jaw, nausea, shortness of breath, cold or clammy skin, feeling faint or lightheaded Heart failure--shortness of breath,  swelling of the ankles, feet, or hands, sudden weight gain, unusual weakness or fatigue Increase in blood pressure Infection--fever, chills, cough, sore throat, wounds that don't heal, pain or trouble when passing urine, general feeling of discomfort or being unwell Infusion reactions--chest pain, shortness of breath or trouble breathing, feeling faint or lightheaded Kidney injury--decrease in the amount of urine, swelling of the ankles, hands, or feet Stomach pain that is severe, does not go away, or gets worse Stroke--sudden numbness or weakness of the face, arm, or leg, trouble speaking, confusion, trouble walking, loss of  balance or coordination, dizziness, severe headache, change in vision Sudden and severe headache, confusion, change in vision, seizures, which may be signs of posterior reversible encephalopathy syndrome (PRES) Side effects that usually do not require medical attention (report to your care team if they continue or are bothersome): Back pain Change in taste Diarrhea Dry skin Increased tears Nosebleed This list may not describe all possible side effects. Call your doctor for medical advice about side effects. You may report side effects to FDA at 1-800-FDA-1088. Where should I keep my medication? This medication is given in a hospital or clinic. It will not be stored at home. NOTE: This sheet is a summary. It may not cover all possible information. If you have questions about this medicine, talk to your doctor, pharmacist, or health care provider.  2024 Elsevier/Gold Standard (2022-04-28 00:00:00)  Leucovorin  Injection What is this medication? LEUCOVORIN  (loo koe VOR in) prevents side effects from certain medications, such as methotrexate. It works by increasing folate levels. This helps protect healthy cells in your body. It may also be used to treat anemia caused by low levels of folate. It can also be used with fluorouracil , a type of chemotherapy, to treat colorectal cancer. It works by increasing the effects of fluorouracil  in the body. This medicine may be used for other purposes; ask your health care provider or pharmacist if you have questions. What should I tell my care team before I take this medication? They need to know if you have any of these conditions: Anemia from low levels of vitamin B12 in the blood An unusual or allergic reaction to leucovorin , folic acid , other medications, foods, dyes, or preservatives Pregnant or trying to get pregnant Breastfeeding How should I use this medication? This medication is injected into a vein or a muscle. It is given by your care team in a  hospital or clinic setting. Talk to your care team about the use of this medication in children. Special care may be needed. Overdosage: If you think you have taken too much of this medicine contact a poison control center or emergency room at once. NOTE: This medicine is only for you. Do not share this medicine with others. What if I miss a dose? Keep appointments for follow-up doses. It is important not to miss your dose. Call your care team if you are unable to keep an appointment. What may interact with this medication? Capecitabine  Fluorouracil  Phenobarbital Phenytoin Primidone Trimethoprim ;sulfamethoxazole This list may not describe all possible interactions. Give your health care provider a list of all the medicines, herbs, non-prescription drugs, or dietary supplements you use. Also tell them if you smoke, drink alcohol, or use illegal drugs. Some items may interact with your medicine. What should I watch for while using this medication? Your condition will be monitored carefully while you are receiving this medication. This medication may increase the side effects of 5-fluorouracil . Tell your care team if you have diarrhea  or mouth sores that do not get better or that get worse. What side effects may I notice from receiving this medication? Side effects that you should report to your care team as soon as possible: Allergic reactions--skin rash, itching, hives, swelling of the face, lips, tongue, or throat This list may not describe all possible side effects. Call your doctor for medical advice about side effects. You may report side effects to FDA at 1-800-FDA-1088. Where should I keep my medication? This medication is given in a hospital or clinic. It will not be stored at home. NOTE: This sheet is a summary. It may not cover all possible information. If you have questions about this medicine, talk to your doctor, pharmacist, or health care provider.  2024 Elsevier/Gold Standard  (2022-05-16 00:00:00)

## 2024-05-05 NOTE — Progress Notes (Signed)
 Hematology/Oncology Progress note [phone] [phone]      CHIEF COMPLAINTS/REASON FOR VISIT:  Follow-up for Stage IV  rectal cancer treatments.  ASSESSMENT & PLAN:   Cancer Staging  Rectal cancer [facility]) Staging form: Colon and Rectum, AJCC 8th Edition - Pathologic stage from 05/03/2022: Stage IIIB (pT3, pN1b, cM0) - Signed by Timmy Forbes, MD on 05/03/2022 - Pathologic stage from 06/18/2023: Stage IVA (rpTX, pN0, pM1a) - Signed by Timmy Forbes, MD on 06/18/2023   No problem-specific Assessment & Plan notes found for this encounter.     Orders Placed This Encounter  Procedures   CEA    Standing Status:   Future    Expected Date:   06/03/2024    Expiration Date:   06/03/2025   Protein, urine, random    Standing Status:   Future    Expected Date:   06/03/2024    Expiration Date:   06/03/2025   CBC with Differential (Cancer Center Only)    Standing Status:   Future    Expected Date:   06/03/2024    Expiration Date:   06/03/2025   CMP (Cancer Center only)    Standing Status:   Future    Expected Date:   06/03/2024    Expiration Date:   06/03/2025    Follow-up 2 week(s)  All questions were answered. The patient knows to call the clinic with any problems, questions or concerns.  Timmy Forbes, MD, PhD [facility] Hematology Oncology 05/05/2024     HISTORY OF PRESENTING ILLNESS:   April Luna is a  53 y.o.  female presents for treatment of rectal cancer Oncology History  Rectal cancer (HCC)  02/26/2022 Imaging   MRI PELVIS WITHOUT CONTRAST- By imaging, rectal cancer stage:  T1/T2, N0, Mx    03/02/2022 Imaging   CT CHEST AND ABDOMEN WITH CONTRAST 1. No convincing evidence of metastatic disease within the chest or abdomen. 2. Atrophic left kidney with multifocal renal scarring and cortical calcifications as well as nonobstructive renal stones measuring up to 5 mm. 3. Prominent left-sided predominant retroperitoneal lymph nodes measuring up to 8 mm near the  level of the renal hilum, overall decreased in size dating back to CT September 19, 2018 and favored reactive related to left renal inflammation. 4.  Aortic Atherosclerosis (ICD10-I70.0).   03/27/2022 Genetic Testing    [facility] Custom[employer]+RNA cancer panel found no pathogenic mutations.    04/21/2022 Initial Diagnosis   Rectal cancer - baseline CEA 3.6 -02/09/2022, patient had colonoscopy which showed renal mass 10 cm from anal verge.  5 mm polyp in ascending colon.  Removed and retrieved. Pathology showed rectal adenocarcinoma.  The polyp in the ascending colon is a tubular adenoma.  MRI showed cT1/T2N0 disease  -04/21/2022, patient underwent robotic assisted ultralow anterior resection. Pathology showed moderately differentiated adenocarcinoma, 4.5 cm in maximal extent, with focal extension through muscularis propria into perirectal soft tissue.  3 lymph nodes positive for metastatic carcinoma.  Negative margin.  pT3 pN1b, MSI stable.   05/03/2022 Cancer Staging   Staging form: Colon and Rectum, AJCC 8th Edition - Pathologic stage from 05/03/2022: Stage IIIB (pT3, pN1b, cM0) - Signed by Timmy Forbes, MD on 05/03/2022 Stage prefix: Initial diagnosis   05/11/2022 Miscellaneous   Medi port placed by Dr.White   05/26/2022 -  Chemotherapy   FOLFOX Q2 weeks x 4   05/26/2022 - 07/09/2022 Chemotherapy   Patient is on Treatment Plan : COLORECTAL FOLFOX q14d x 8 cycles  05/26/2022 - 12/13/2022 Chemotherapy   Patient is on Treatment Plan : COLORECTAL FOLFOX q14d x 4 months     07/27/2022 -  Chemotherapy   Concurrent chemotherapy- Xeloda   1300mg  BID and radiation.    07/27/2022 - 09/12/2022 Radiation Therapy    concurrent chemotherapy [Xeloda  1300 mg BID] with RT    09/20/2022 Imaging   CT Angiogram chest PET protocol There is no evidence of pulmonary artery embolism. There is no evidence of thoracic aortic dissection.   Small linear patchy alveolar infiltrate is seen in medial segment of right middle lobe  suggesting atelectasis/pneumonia.     09/28/2022 - 09/30/2022 Hospital Admission   Admission due to acute respiratory failure with hypoxia, COPD exacerbation   09/28/2022 Imaging   CT angio chest PE protocol 1. Negative for acute PE or thoracic aortic dissection. 2. New ground-glass infiltrates anteriorly in bilateral upper lobes,may represent atypical edema, infectious or inflammatory process. 3.  Aortic Atherosclerosis     12/22/2022 Imaging   CT chest abdomen pelvis w contrast  Stable exam. No evidence of recurrent or metastatic carcinoma within the chest, abdomen, or pelvis.   Left nephrolithiasis and renal parenchymal atrophy. No evidence of ureteral calculi or hydronephrosis.   Aortic Atherosclerosis   05/31/2023 Imaging   CT chest abdomen pelvis with contrast showed 1. New hypodense 16 mm lesion in the right lobe of the liver with very subtle adjacent 5 mm lesion, suspicious for metastatic disease. 2. Small bilateral pulmonary nodules, some of which were subtly evident on prior examination only in retrospect, some of which demonstrate degree of cavitation, suspicious for metastatic disease. 3. Prominent retroperitoneal lymph nodes are similar prior. 4. Prior low anterior resection with Hartmann's pouch formation,similar small amount of perirectal/presacral soft tissue and fluid,favored postsurgical/posttreatment change. Suggest continued attention on follow-up imaging. 5. Large volume of formed stool in the colon. 6. Nonobstructive left renal calculi measure under 1 cm.     06/08/2023 Relapse/Recurrence   Ultrasound-guided liver biopsy showed Pathology showed metastatic carcinoma, morphology consistent with patient's clinical history of rectal adenocarcinoma.  [employer] NGS 648 gene panel showed TP53 missense variant, APC frameshift, TCF7L2 frameshift, FLT3 copy number gain, TMB 4.3m/MB MSI stable.  Wildtype KRAS/NRAS  [facility] RNA Seq - ERBB3 overexpressed, VEGFA overexpressed,  NRAS overexpressed.     06/18/2023 Cancer Staging   Staging form: Colon and Rectum, AJCC 8th Edition - Pathologic stage from 06/18/2023: Stage IVA (rpTX, pN0, pM1a) - Signed by Timmy Forbes, MD on 06/18/2023 Stage prefix: Recurrence Total positive nodes: 0   06/25/2023 - 06/25/2023 Chemotherapy   Patient is on Treatment Plan : COLORECTAL FOLFOX + Bevacizumab  q14d     07/02/2023 -  Chemotherapy   Patient is on Treatment Plan : COLORECTAL FOLFIRI + Bevacizumab  q14d     11/13/2023 Imaging   CT chest abdomen pelvis w contrast showed  1. Increase in cavitation with decreased soft tissue involving the scattered bilateral pulmonary nodules, consistent with treatment response. No new suspicious pulmonary nodules or masses. 2. Decrease in size and conspicuity of the hypodense lesions in the right lobe of the liver, consistent with treatment response. No new suspicious hepatic lesion identified. 3. Stable prominent nonspecific retroperitoneal lymph nodes. Suggest continued attention on follow-up imaging. 4. Similar surgical changes of prior low anterior resection with left anterior abdominal wall colostomy. Similar mesorectal/presacral soft tissue, no new suspicious enhancing nodularity. 5. Similar soft tissue nodularity in the bilateral posterior gluteal subcutaneous soft tissues. 6. Moderate volume of formed stool in the colon. Correlate  for constipation.   03/03/2024 Imaging   CT chest abdomen pelvis w contrast showed 1. 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 2. No new or progressive disease. 3. Similar prominent but not pathologically sized abdominal retroperitoneal nodes, nonspecific. 4. Status post low anterior resection with descending colostomy. Possible constipation. 5. Age advanced coronary artery atherosclerosis. Recommend assessment of coronary risk factors. 6.  Aortic Atherosclerosis (ICD10-I70.0). 7. Left nephrolithiasis and renal scarring.    Patient has  bipolar and schizophrenia..  Patient is married  She has housing issue due to previous history of eviction.  she does not live with her husband currently. She lives with some family members.   + chronic dysuria and urgency she was seen by Urology and was started on Gemteza.  Symptoms are improved.    INTERVAL HISTORY April Luna is a 53 y.o. female who has above history reviewed by me today presents for follow up visit for rectal cancer.   Denies any melena or blood in the stool. Stable neuropathy symptoms. + loose BM, she empty her colostomy bag once a day +decreased appetite. Weight is stable.  No nausea, this morning, she vomited once.   Review of Systems  Constitutional:  Positive for fatigue. Negative for appetite change, chills and fever.  HENT:   Negative for hearing loss, mouth sores and voice change.   Eyes:  Negative for eye problems.  Respiratory:  Negative for chest tightness, cough and shortness of breath.   Cardiovascular:  Negative for chest pain.  Gastrointestinal:  Negative for abdominal distention, abdominal pain, blood in stool, diarrhea and nausea.       + colostomy   Endocrine: Negative for hot flashes.  Genitourinary:  Negative for difficulty urinating, dysuria and frequency.   Musculoskeletal:  Negative for arthralgias.  Skin:  Negative for itching and rash.  Neurological:  Positive for numbness. Negative for extremity weakness.  Hematological:  Negative for adenopathy.  Psychiatric/Behavioral:  Negative for confusion.     MEDICAL HISTORY:  Past Medical History:  Diagnosis Date   Allergy    pollen   Anxiety    Arthritis    right hip   Bipolar 1 disorder (HCC)    Cancer (HCC)    rectal   Chemotherapy-induced neuropathy (HCC) 10/24/2022   Chronic kidney disease    COPD (chronic obstructive pulmonary disease) (HCC)    Depression    Family history of adverse reaction to anesthesia    grand father had a stroke during anesthesia   Family  history of breast cancer    Family history of colon cancer    Family history of uterine cancer    GERD (gastroesophageal reflux disease)    History of kidney stones    Hyperlipidemia    Hypertension    Hypothyroidism    Panic attack    Pneumonia    Psoriasis    Sleep apnea 08/11/2021   No CPAP   Type 2 diabetes mellitus with microalbuminuria, without long-term current use of insulin  (HCC) 06/24/2019    SURGICAL HISTORY: Past Surgical History:  Procedure Laterality Date   BREAST BIOPSY Left 12/14/2021   us  bx, venus marker, path pending   CESAREAN SECTION     COLONOSCOPY WITH PROPOFOL  N/A 02/09/2022   Procedure: COLONOSCOPY WITH PROPOFOL ;  Surgeon: Selena Daily, MD;  Location: [facility];  Service: Endoscopy;  Laterality: N/A;  sleep apnea   CYSTOSCOPY W/ RETROGRADES Left 11/08/2018   Procedure: CYSTOSCOPY WITH RETROGRADE PYELOGRAM;  Surgeon: Estanislao Heimlich,  Dennard Fisher, MD;  Location: [facility];  Service: Urology;  Laterality: Left;   CYSTOSCOPY/URETEROSCOPY/HOLMIUM LASER/STENT PLACEMENT Left 11/08/2018   Procedure: CYSTOSCOPY/URETEROSCOPY/HOLMIUM LASER/STENT PLACEMENT;  Surgeon: Lawerence Pressman, MD;  Location: [facility];  Service: Urology;  Laterality: Left;   IR CV LINE INJECTION  06/25/2023   IR IMAGING GUIDED PORT INSERTION  05/11/2022   IR REMOVE CV FIBRIN SHEATH  06/27/2023   MOUTH SURGERY     wisdom teeth extraction   MOUTH SURGERY     teeth removal   POLYPECTOMY N/A 02/09/2022   Procedure: POLYPECTOMY;  Surgeon: Selena Daily, MD;  Location: [facility];  Service: Endoscopy;  Laterality: N/A;   XI ROBOTIC ASSISTED LOWER ANTERIOR RESECTION N/A 04/21/2022   Procedure: XI ROBOTIC ASSISTED LOWER ANTERIOR RESECTION WITH COLOSTOMY, BILATERAL TAP BLOCK, ASSESSMENT OF TISSUE PERFUSSION WITH FIREFLY INJECTION;  Surgeon: Melvenia Stabs, MD;  Location: [facility];  Service: General;  Laterality: N/A;    SOCIAL HISTORY: Social History   Socioeconomic History    Marital status: Married    Spouse name: Ava Lei   Number of children: 1   Years of education: 12   Highest education level: High school graduate  Occupational History   Occupation: unemployed    Comment: disabled  Tobacco Use   Smoking status: Some Days    Current packs/day: 0.25    Average packs/day: 0.3 packs/day for 39.0 years (9.7 ttl pk-yrs)    Types: Cigarettes    Start date: 05/13/1985    Passive exposure: Current   Smokeless tobacco: Never   Tobacco comments:    5-6 cigarettes weekly- 11/07/2022  Vaping Use   Vaping status: Former   Start date: 05/25/2018   Quit date: 09/24/2018  Substance and Sexual Activity   Alcohol use: Not Currently    Alcohol/week: 4.0 standard drinks of alcohol    Types: 4 Glasses of wine per week    Comment: quit ETOH in Feb. 2023   Drug use: Yes    Types: Marijuana    Comment: 2 days ago   Sexual activity: Not Currently    Birth control/protection: Post-menopausal  Other Topics Concern   Not on file  Social History Narrative   Lives with husband and son    Social Drivers of Health   Financial Resource Strain: High Risk (08/30/2023)   Overall Financial Resource Strain (CARDIA)    Difficulty of Paying Living Expenses: Hard  Food Insecurity: Food Insecurity Present (08/30/2023)   Hunger Vital Sign    Worried About Running Out of Food in the Last Year: Sometimes true    Ran Out of Food in the Last Year: Sometimes true  Transportation Needs: No Transportation Needs (08/30/2023)   PRAPARE - Administrator, Civil Service (Medical): No    Lack of Transportation (Non-Medical): No  Physical Activity: Inactive (08/30/2023)   Exercise Vital Sign    Days of Exercise per Week: 0 days    Minutes of Exercise per Session: 0 min  Stress: Stress Concern Present (08/30/2023)   [employer] of Occupational Health - Occupational Stress Questionnaire    Feeling of Stress : Very much  Social Connections: Moderately Integrated (08/30/2023)    Social Connection and Isolation Panel [NHANES]    Frequency of Communication with Friends and Family: Never    Frequency of Social Gatherings with Friends and Family: Once a week    Attends Religious Services: More than 4 times per year    Active Member of [employer]  or Organizations: No    Attends Banker Meetings: 1 to 4 times per year    Marital Status: Married  Catering manager Violence: Not At Risk (08/30/2023)   Humiliation, Afraid, Rape, and Kick questionnaire    Fear of Current or Ex-Partner: No    Emotionally Abused: No    Physically Abused: No    Sexually Abused: No    FAMILY HISTORY: Family History  Problem Relation Age of Onset   Depression Mother    Anxiety disorder Mother    Diabetes Mother    Hypertension Mother    Hyperlipidemia Mother    Cancer Mother    Uterine cancer Mother 26   Cervical cancer Mother 77   Colon cancer Father    Depression Brother    Anxiety disorder Brother    Cancer Maternal Aunt        unk types   Diabetes Mellitus II Maternal Grandmother    Hypercholesterolemia Maternal Grandmother    Breast cancer Maternal Grandmother    Cancer Paternal Grandmother    Diabetes Paternal Grandmother    Melanoma Paternal Grandmother    Stomach cancer Paternal Grandmother     ALLERGIES:  is allergic to metformin and related, nsaids, perphenazine, sulfa antibiotics, abilify [aripiprazole], and penicillins.  MEDICATIONS:  Current Outpatient Medications  Medication Sig Dispense Refill   acetaminophen  (TYLENOL ) 325 MG tablet Take 650 mg by mouth every 6 (six) hours as needed for headache (pain).     albuterol  (VENTOLIN  HFA) 108 (90 Base) MCG/ACT inhaler Inhale 2 puffs into the lungs every 6 (six) hours as needed for wheezing or shortness of breath. 1 each 5   amantadine  (SYMMETREL ) 100 MG capsule Take 100 mg by mouth 2 (two) times daily.     blood glucose meter kit and supplies Dispense based on patient and insurance preference. Use up to four  times daily as directed. (FOR ICD-10 E10.9, E11.9). 1 each 0   Budeson-Glycopyrrol-Formoterol  (BREZTRI  AEROSPHERE) 160-9-4.8 MCG/ACT AERO Inhale 2 puffs into the lungs in the morning and at bedtime. 10.7 g 2   buPROPion  (WELLBUTRIN  XL) 300 MG 24 hr tablet Take 300 mg by mouth daily.     Cholecalciferol  (VITAMIN D -3) 125 MCG (5000 UT) TABS Take 5,000 Units by mouth daily. 30 tablet 1   clonazePAM (KLONOPIN) 0.5 MG tablet Take 1 mg by mouth 2 (two) times daily as needed.     dexamethasone  (DECADRON ) 4 MG tablet Take 2 tablets (8 mg total) by mouth See admin instructions. Take 8mg  daily for 2 days after each chemotherapy. 30 tablet 1   diltiazem  (CARDIZEM  CD) 120 MG 24 hr capsule TAKE 1 CAPSULE BY MOUTH DAILY 90 capsule 1   docusate sodium  (COLACE) 100 MG capsule TAKE 1 CAPSULE BY MOUTH TWICE DAILY 60 capsule 2   FARXIGA  10 MG TABS tablet TAKE 1 TABLET BY MOUTH DAILY BEFORE BREAKFAST 90 tablet 0   gabapentin  (NEURONTIN ) 300 MG capsule Take 300 mg by mouth 2 (two) times daily.     icosapent  Ethyl (VASCEPA ) 1 g capsule Take 2 capsules (2 g total) by mouth in the morning and at bedtime. 120 capsule 2   levothyroxine  (SYNTHROID ) 75 MCG tablet TAKE 1 TABLET BY MOUTH DAILY BEFORE BREAKFAST 90 tablet 2   lidocaine -prilocaine  (EMLA ) cream Apply to affected area once 30 g 3   loperamide  (IMODIUM ) 2 MG capsule Take 1 capsule (2 mg total) by mouth See admin instructions. Initial: 4 mg,the 2 mg every 2 hours (4 mg every  4 hours at night)  maximum: 16 mg/day 120 capsule 2   loratadine  (CLARITIN ) 10 MG tablet Take 10 mg by mouth every morning.     magic mouthwash w/lidocaine  SOLN Take 5 mLs by mouth 4 (four) times daily as needed for mouth pain. Sig: Swish/Swallow 5-10 ml four times a day as needed. Dispense 480 ml. 1RF 480 mL 1   ondansetron  (ZOFRAN ) 8 MG tablet Take 1 tablet (8 mg total) by mouth every 8 (eight) hours as needed for nausea, vomiting or refractory nausea / vomiting. Start on the third day after  chemotherapy. 30 tablet 1   paliperidone  (INVEGA  SUSTENNA) 234 MG/1.5ML SUSY injection Inject 234 mg into the muscle every 30 (thirty) days. On or about the 14th of each month     pantoprazole  (PROTONIX ) 40 MG tablet TAKE 1 TABLET BY MOUTH DAILY 90 tablet 2   pioglitazone  (ACTOS ) 15 MG tablet Take 1 tablet (15 mg total) by mouth daily. 90 tablet 1   promethazine  (PHENERGAN ) 12.5 MG tablet Take 1 tablet (12.5 mg total) by mouth every 6 (six) hours as needed for nausea or vomiting. 30 tablet 0   rosuvastatin  (CRESTOR ) 40 MG tablet TAKE 1 TABLET BY MOUTH EVERY MORNING 90 tablet 0   Spacer/Aero-Holding Chambers (AEROCHAMBER MV) inhaler Use as instructed 1 each 0   Vibegron  (GEMTESA ) 75 MG TABS Take 1 tablet (75 mg total) by mouth daily.     sertraline  (ZOLOFT ) 100 MG tablet Take 200 mg by mouth every morning. (Patient not taking: Reported on 04/21/2024)     No current facility-administered medications for this visit.   Facility-Administered Medications Ordered in Other Visits  Medication Dose Route Frequency Provider Last Rate Last Admin   albuterol  (PROVENTIL ) (2.5 MG/3ML) 0.083% nebulizer solution 2.5 mg  2.5 mg Nebulization Once Dgayli, Khabib, MD       atropine  injection 0.5 mg  0.5 mg Intravenous Once Ocean Schildt, MD       bevacizumab -awwb (MVASI ) 300 mg in sodium chloride  0.9 % 100 mL chemo infusion  5 mg/kg (Treatment Plan Recorded) Intravenous Once Timmy Forbes, MD       fluorouracil  (ADRUCIL ) 3,850 mg in sodium chloride  0.9 % 73 mL chemo infusion  2,400 mg/m2 (Treatment Plan Recorded) Intravenous 1 day or 1 dose Timmy Forbes, MD       fluorouracil  (ADRUCIL ) chemo injection 650 mg  400 mg/m2 (Treatment Plan Recorded) Intravenous Once Martin Belling, MD       irinotecan  (CAMPTOSAR ) 300 mg in sodium chloride  0.9 % 500 mL chemo infusion  180 mg/m2 (Treatment Plan Recorded) Intravenous Once Timmy Forbes, MD       leucovorin  644 mg in sodium chloride  0.9 % 250 mL infusion  400 mg/m2 (Treatment Plan Recorded)  Intravenous Once Timmy Forbes, MD       sodium chloride  flush (NS) 0.9 % injection 10 mL  10 mL Intracatheter PRN Timmy Forbes, MD   10 mL at 08/15/23 1327   sodium chloride  flush (NS) 0.9 % injection 10 mL  10 mL Intracatheter PRN Timmy Forbes, MD   10 mL at 03/26/24 1255     PHYSICAL EXAMINATION: ECOG PERFORMANCE STATUS: 0 - Asymptomatic Vitals:   05/05/24 0853  BP: 126/88  Pulse: 84  Resp: 18  Temp: (!) 97.4 F (36.3 C)  SpO2: 100%    Filed Weights   05/05/24 0853  Weight: 125 lb 4.8 oz (56.8 kg)     Physical Exam Constitutional:      General: She is  not in acute distress. HENT:     Head: Normocephalic and atraumatic.  Eyes:     General: No scleral icterus. Cardiovascular:     Rate and Rhythm: Normal rate and regular rhythm.  Pulmonary:     Effort: Pulmonary effort is normal. No respiratory distress.     Breath sounds: No wheezing.     Comments: Decreased breath sound bilaterally Abdominal:     General: Bowel sounds are normal. There is no distension.     Palpations: Abdomen is soft.     Comments: +colostomy   Musculoskeletal:        General: No deformity. Normal range of motion.     Cervical back: Normal range of motion and neck supple.  Skin:    General: Skin is warm and dry.     Findings: No erythema or rash.  Neurological:     Mental Status: She is alert and oriented to person, place, and time. Mental status is at baseline.     Cranial Nerves: No cranial nerve deficit.     Coordination: Coordination normal.  Psychiatric:        Mood and Affect: Mood normal.     LABORATORY DATA:  I have reviewed the data as listed    Latest Ref Rng & Units 05/05/2024    8:31 AM 04/21/2024    8:15 AM 04/07/2024    7:57 AM  CBC  WBC 4.0 - 10.5 K/uL 3.4  3.0  5.5   Hemoglobin 12.0 - 15.0 g/dL 16.1  09.6  04.5   Hematocrit 36.0 - 46.0 % 40.0  39.7  42.0   Platelets 150 - 400 K/uL 140  144  125       Latest Ref Rng & Units 05/05/2024    8:31 AM 04/21/2024    8:15 AM 04/07/2024     7:57 AM  CMP  Glucose 70 - 99 mg/dL 409  811  914   BUN 6 - 20 mg/dL 17  13  21    Creatinine 0.44 - 1.00 mg/dL 7.82  9.56  2.13   Sodium 135 - 145 mmol/L 138  138  139   Potassium 3.5 - 5.1 mmol/L 4.0  3.6  4.0   Chloride 98 - 111 mmol/L 105  108  105   CO2 22 - 32 mmol/L 24  21  24    Calcium  8.9 - 10.3 mg/dL 9.1  8.5  8.9   Total Protein 6.5 - 8.1 g/dL 6.5  6.3  6.9   Total Bilirubin 0.0 - 1.2 mg/dL 0.4  0.2  0.4   Alkaline Phos 38 - 126 U/L 75  78  74   AST 15 - 41 U/L 20  17  19    ALT 0 - 44 U/L 17  13  13          RADIOGRAPHIC STUDIES: I have personally reviewed the radiological images as listed and agreed with the findings in the report. No results found.

## 2024-05-06 LAB — CEA: CEA: 4 ng/mL (ref 0.0–4.7)

## 2024-05-07 ENCOUNTER — Inpatient Hospital Stay

## 2024-05-07 VITALS — BP 110/84 | HR 87 | Temp 96.2°F | Resp 17

## 2024-05-07 DIAGNOSIS — C20 Malignant neoplasm of rectum: Secondary | ICD-10-CM

## 2024-05-07 MED ORDER — SODIUM CHLORIDE 0.9% FLUSH
10.0000 mL | INTRAVENOUS | Status: DC | PRN
  Administered 2024-05-07: 10 mL
  Filled 2024-05-07: qty 10

## 2024-05-07 MED ORDER — HEPARIN SOD (PORK) LOCK FLUSH 100 UNIT/ML IV SOLN
500.0000 [IU] | Freq: Once | INTRAVENOUS | Status: AC | PRN
Start: 2024-05-07 — End: 2024-05-07
  Administered 2024-05-07: 500 [IU]
  Filled 2024-05-07: qty 5

## 2024-05-20 ENCOUNTER — Inpatient Hospital Stay

## 2024-05-20 ENCOUNTER — Inpatient Hospital Stay (HOSPITAL_BASED_OUTPATIENT_CLINIC_OR_DEPARTMENT_OTHER): Admitting: Oncology

## 2024-05-20 ENCOUNTER — Encounter: Payer: Self-pay | Admitting: Oncology

## 2024-05-20 VITALS — BP 135/87 | HR 88 | Temp 96.0°F | Resp 18 | Wt 126.9 lb

## 2024-05-20 DIAGNOSIS — E876 Hypokalemia: Secondary | ICD-10-CM | POA: Diagnosis not present

## 2024-05-20 DIAGNOSIS — C20 Malignant neoplasm of rectum: Secondary | ICD-10-CM | POA: Diagnosis not present

## 2024-05-20 DIAGNOSIS — T451X5A Adverse effect of antineoplastic and immunosuppressive drugs, initial encounter: Secondary | ICD-10-CM

## 2024-05-20 DIAGNOSIS — G620 Drug-induced polyneuropathy: Secondary | ICD-10-CM | POA: Diagnosis not present

## 2024-05-20 DIAGNOSIS — K521 Toxic gastroenteritis and colitis: Secondary | ICD-10-CM | POA: Diagnosis not present

## 2024-05-20 DIAGNOSIS — Z95828 Presence of other vascular implants and grafts: Secondary | ICD-10-CM

## 2024-05-20 LAB — CBC WITH DIFFERENTIAL (CANCER CENTER ONLY)
Abs Immature Granulocytes: 0.01 10*3/uL (ref 0.00–0.07)
Basophils Absolute: 0 10*3/uL (ref 0.0–0.1)
Basophils Relative: 1 %
Eosinophils Absolute: 0 10*3/uL (ref 0.0–0.5)
Eosinophils Relative: 1 %
HCT: 39.4 % (ref 36.0–46.0)
Hemoglobin: 13.2 g/dL (ref 12.0–15.0)
Immature Granulocytes: 0 %
Lymphocytes Relative: 19 %
Lymphs Abs: 0.5 10*3/uL — ABNORMAL LOW (ref 0.7–4.0)
MCH: 33.3 pg (ref 26.0–34.0)
MCHC: 33.5 g/dL (ref 30.0–36.0)
MCV: 99.5 fL (ref 80.0–100.0)
Monocytes Absolute: 0.3 10*3/uL (ref 0.1–1.0)
Monocytes Relative: 11 %
Neutro Abs: 1.9 10*3/uL (ref 1.7–7.7)
Neutrophils Relative %: 68 %
Platelet Count: 174 10*3/uL (ref 150–400)
RBC: 3.96 MIL/uL (ref 3.87–5.11)
RDW: 14.5 % (ref 11.5–15.5)
WBC Count: 2.8 10*3/uL — ABNORMAL LOW (ref 4.0–10.5)
nRBC: 0 % (ref 0.0–0.2)

## 2024-05-20 LAB — CMP (CANCER CENTER ONLY)
ALT: 13 U/L (ref 0–44)
AST: 20 U/L (ref 15–41)
Albumin: 3.2 g/dL — ABNORMAL LOW (ref 3.5–5.0)
Alkaline Phosphatase: 67 U/L (ref 38–126)
Anion gap: 9 (ref 5–15)
BUN: 10 mg/dL (ref 6–20)
CO2: 22 mmol/L (ref 22–32)
Calcium: 8.3 mg/dL — ABNORMAL LOW (ref 8.9–10.3)
Chloride: 109 mmol/L (ref 98–111)
Creatinine: 0.94 mg/dL (ref 0.44–1.00)
GFR, Estimated: >60 mL/min (ref >60)
Glucose, Bld: 148 mg/dL — ABNORMAL HIGH (ref 70–99)
Potassium: 3.4 mmol/L — ABNORMAL LOW (ref 3.5–5.1)
Sodium: 140 mmol/L (ref 135–145)
Total Bilirubin: 0.5 mg/dL (ref 0.0–1.2)
Total Protein: 5.8 g/dL — ABNORMAL LOW (ref 6.5–8.1)

## 2024-05-20 LAB — TOTAL PROTEIN, URINE DIPSTICK: Protein, ur: 30 mg/dL — AB

## 2024-05-20 MED ORDER — ALTEPLASE 2 MG IJ SOLR
2.0000 mg | Freq: Once | INTRAMUSCULAR | Status: AC
  Administered 2024-05-20: 2 mg
  Filled 2024-05-20: qty 2

## 2024-05-20 MED ORDER — POTASSIUM CHLORIDE CRYS ER 20 MEQ PO TBCR: 20.0000 meq | EXTENDED_RELEASE_TABLET | Freq: Every day | ORAL | 0 refills | Status: AC

## 2024-05-20 MED ORDER — HEPARIN SOD (PORK) LOCK FLUSH 100 UNIT/ML IV SOLN
500.0000 [IU] | Freq: Once | INTRAVENOUS | Status: AC
  Administered 2024-05-20: 500 [IU] via INTRAVENOUS
  Filled 2024-05-20: qty 5

## 2024-05-20 NOTE — Progress Notes (Signed)
 Hematology/Oncology Progress note [phone] [phone]      CHIEF COMPLAINTS/REASON FOR VISIT:  Follow-up for Stage IV  rectal cancer treatments.  ASSESSMENT & PLAN:   Cancer Staging  Rectal cancer [facility]) Staging form: Colon and Rectum, AJCC 8th Edition - Pathologic stage from 05/03/2022: Stage IIIB (pT3, pN1b, cM0) - Signed by Timmy Forbes, MD on 05/03/2022 - Pathologic stage from 06/18/2023: Stage IVA (rpTX, pN0, pM1a) - Signed by Timmy Forbes, MD on 06/18/2023   Rectal cancer [facility]) History of Stage III. pT3 pN1b cM0, s/p APR. 05/2023 Stage IV current rectal adenocarcinoma Currently on adjuvant chemotherapy. S/p FOLFOX x 4 cycles S/p concurrent chemotherapy [Xeloda  1300 mg BID] with RT, and then another 4 cycles of adjuvant FOLFOX [dose reduced oxaliplatin  and omit 5-FU bolus 05/2023 CT imaging indicates disease progression. liver biopsy pathology showed metastatic carcinoma--> FOLFIRI + Bevacizumab    NGS showed TP53 missense variant, APC frameshift, TCF7L2 Frameshift, FLT3 copy number gain., TMB 4.7, pMMR Wildtype KRAS/NRAS Labs are reviewed and discussed with patient. Hold FOLFIRI + Bevacizumab  due to fatigue and diarrhea    Chemotherapy-induced neuropathy (HCC) Grade 2  Continue gabapentin  300mg  BID.   Chemotherapy induced diarrhea Recommend Imodium  PRN as directed.   Hypokalemia  Recommend oral KCL 20meq daily.     Orders Placed This Encounter  Procedures   CT CHEST ABDOMEN PELVIS W CONTRAST    Standing Status:   Future    Expected Date:   05/27/2024    Expiration Date:   05/20/2025    If indicated for the ordered procedure, I authorize the administration of contrast media per Radiology protocol:   Yes    Does the patient have a contrast media/X-ray dye allergy?:   No    Is patient pregnant?:   No    Preferred imaging location?:   [facility]    If indicated for the ordered procedure, I authorize the administration of oral contrast media per  Radiology protocol:   Yes   CEA    Standing Status:   Future    Expected Date:   06/03/2024    Expiration Date:   06/03/2025   Protein, urine, random    Standing Status:   Future    Expected Date:   06/03/2024    Expiration Date:   06/03/2025   CBC with Differential (Cancer Center Only)    Standing Status:   Future    Expected Date:   06/03/2024    Expiration Date:   06/03/2025   CMP (Cancer Center only)    Standing Status:   Future    Expected Date:   06/03/2024    Expiration Date:   06/03/2025    Follow-up 2 week(s)  All questions were answered. The patient knows to call the clinic with any problems, questions or concerns.  Timmy Forbes, MD, PhD [facility] Hematology Oncology 05/20/2024     HISTORY OF PRESENTING ILLNESS:   April Luna is a  53 y.o.  female presents for treatment of rectal cancer Oncology History  Rectal cancer (HCC)  02/26/2022 Imaging   MRI PELVIS WITHOUT CONTRAST- By imaging, rectal cancer stage:  T1/T2, N0, Mx    03/02/2022 Imaging   CT CHEST AND ABDOMEN WITH CONTRAST 1. No convincing evidence of metastatic disease within the chest or abdomen. 2. Atrophic left kidney with multifocal renal scarring and cortical calcifications as well as nonobstructive renal stones measuring up to 5 mm. 3. Prominent left-sided predominant retroperitoneal lymph nodes measuring up to 8 mm  near the level of the renal hilum, overall decreased in size dating back to CT September 19, 2018 and favored reactive related to left renal inflammation. 4.  Aortic Atherosclerosis (ICD10-I70.0).   03/27/2022 Genetic Testing    [facility] Custom[employer]+RNA cancer panel found no pathogenic mutations.    04/21/2022 Initial Diagnosis   Rectal cancer - baseline CEA 3.6 -02/09/2022, patient had colonoscopy which showed renal mass 10 cm from anal verge.  5 mm polyp in ascending colon.  Removed and retrieved. Pathology showed rectal adenocarcinoma.  The polyp in the ascending colon is a tubular  adenoma.  MRI showed cT1/T2N0 disease  -04/21/2022, patient underwent robotic assisted ultralow anterior resection. Pathology showed moderately differentiated adenocarcinoma, 4.5 cm in maximal extent, with focal extension through muscularis propria into perirectal soft tissue.  3 lymph nodes positive for metastatic carcinoma.  Negative margin.  pT3 pN1b, MSI stable.   05/03/2022 Cancer Staging   Staging form: Colon and Rectum, AJCC 8th Edition - Pathologic stage from 05/03/2022: Stage IIIB (pT3, pN1b, cM0) - Signed by Timmy Forbes, MD on 05/03/2022 Stage prefix: Initial diagnosis   05/11/2022 Miscellaneous   Medi port placed by Dr.White   05/26/2022 -  Chemotherapy   FOLFOX Q2 weeks x 4   05/26/2022 - 07/09/2022 Chemotherapy   Patient is on Treatment Plan : COLORECTAL FOLFOX q14d x 8 cycles     05/26/2022 - 12/13/2022 Chemotherapy   Patient is on Treatment Plan : COLORECTAL FOLFOX q14d x 4 months     07/27/2022 -  Chemotherapy   Concurrent chemotherapy- Xeloda   1300mg  BID and radiation.    07/27/2022 - 09/12/2022 Radiation Therapy    concurrent chemotherapy [Xeloda  1300 mg BID] with RT    09/20/2022 Imaging   CT Angiogram chest PET protocol There is no evidence of pulmonary artery embolism. There is no evidence of thoracic aortic dissection.   Small linear patchy alveolar infiltrate is seen in medial segment of right middle lobe suggesting atelectasis/pneumonia.     09/28/2022 - 09/30/2022 Hospital Admission   Admission due to acute respiratory failure with hypoxia, COPD exacerbation   09/28/2022 Imaging   CT angio chest PE protocol 1. Negative for acute PE or thoracic aortic dissection. 2. New ground-glass infiltrates anteriorly in bilateral upper lobes,may represent atypical edema, infectious or inflammatory process. 3.  Aortic Atherosclerosis     12/22/2022 Imaging   CT chest abdomen pelvis w contrast  Stable exam. No evidence of recurrent or metastatic carcinoma within the chest,  abdomen, or pelvis.   Left nephrolithiasis and renal parenchymal atrophy. No evidence of ureteral calculi or hydronephrosis.   Aortic Atherosclerosis   05/31/2023 Imaging   CT chest abdomen pelvis with contrast showed 1. New hypodense 16 mm lesion in the right lobe of the liver with very subtle adjacent 5 mm lesion, suspicious for metastatic disease. 2. Small bilateral pulmonary nodules, some of which were subtly evident on prior examination only in retrospect, some of which demonstrate degree of cavitation, suspicious for metastatic disease. 3. Prominent retroperitoneal lymph nodes are similar prior. 4. Prior low anterior resection with Hartmann's pouch formation,similar small amount of perirectal/presacral soft tissue and fluid,favored postsurgical/posttreatment change. Suggest continued attention on follow-up imaging. 5. Large volume of formed stool in the colon. 6. Nonobstructive left renal calculi measure under 1 cm.     06/08/2023 Relapse/Recurrence   Ultrasound-guided liver biopsy showed Pathology showed metastatic carcinoma, morphology consistent with patient's clinical history of rectal adenocarcinoma.  [facility] NGS 648 gene panel showed TP53 missense variant, APC  frameshift, TCF7L2 frameshift, FLT3 copy number gain, TMB 4.6m/MB MSI stable.  Wildtype KRAS/NRAS  [facility] RNA Seq - ERBB3 overexpressed, VEGFA overexpressed, NRAS overexpressed.     06/18/2023 Cancer Staging   Staging form: Colon and Rectum, AJCC 8th Edition - Pathologic stage from 06/18/2023: Stage IVA (rpTX, pN0, pM1a) - Signed by Timmy Forbes, MD on 06/18/2023 Stage prefix: Recurrence Total positive nodes: 0   06/25/2023 - 06/25/2023 Chemotherapy   Patient is on Treatment Plan : COLORECTAL FOLFOX + Bevacizumab  q14d     07/02/2023 -  Chemotherapy   Patient is on Treatment Plan : COLORECTAL FOLFIRI + Bevacizumab  q14d     11/13/2023 Imaging   CT chest abdomen pelvis w contrast showed  1. Increase in cavitation with  decreased soft tissue involving the scattered bilateral pulmonary nodules, consistent with treatment response. No new suspicious pulmonary nodules or masses. 2. Decrease in size and conspicuity of the hypodense lesions in the right lobe of the liver, consistent with treatment response. No new suspicious hepatic lesion identified. 3. Stable prominent nonspecific retroperitoneal lymph nodes. Suggest continued attention on follow-up imaging. 4. Similar surgical changes of prior low anterior resection with left anterior abdominal wall colostomy. Similar mesorectal/presacral soft tissue, no new suspicious enhancing nodularity. 5. Similar soft tissue nodularity in the bilateral posterior gluteal subcutaneous soft tissues. 6. Moderate volume of formed stool in the colon. Correlate for constipation.   03/03/2024 Imaging   CT chest abdomen pelvis w contrast showed 1. 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 2. No new or progressive disease. 3. Similar prominent but not pathologically sized abdominal retroperitoneal nodes, nonspecific. 4. Status post low anterior resection with descending colostomy. Possible constipation. 5. Age advanced coronary artery atherosclerosis. Recommend assessment of coronary risk factors. 6.  Aortic Atherosclerosis (ICD10-I70.0). 7. Left nephrolithiasis and renal scarring.    Patient has bipolar and schizophrenia..  Patient is married  She has housing issue due to previous history of eviction.  she does not live with her husband currently. She lives with some family members.   + chronic dysuria and urgency she was seen by Urology and was started on Gemteza.  Symptoms are improved.    INTERVAL HISTORY April Luna is a 53 y.o. female who has above history reviewed by me today presents for follow up visit for rectal cancer.   Denies any melena or blood in the stool. Stable neuropathy symptoms. + loose BM, slightly worse than  baseline.  +decreased appetite. Feels more tried and does not want to do treatment today.  Weight is stable.    Review of Systems  Constitutional:  Positive for fatigue. Negative for appetite change, chills and fever.  HENT:   Negative for hearing loss, mouth sores and voice change.   Eyes:  Negative for eye problems.  Respiratory:  Negative for chest tightness, cough and shortness of breath.   Cardiovascular:  Negative for chest pain.  Gastrointestinal:  Positive for diarrhea. Negative for abdominal distention, abdominal pain, blood in stool and nausea.       + colostomy   Endocrine: Negative for hot flashes.  Genitourinary:  Negative for difficulty urinating, dysuria and frequency.   Musculoskeletal:  Negative for arthralgias.  Skin:  Negative for itching and rash.  Neurological:  Positive for numbness. Negative for extremity weakness.  Hematological:  Negative for adenopathy.  Psychiatric/Behavioral:  Negative for confusion.     MEDICAL HISTORY:  Past Medical History:  Diagnosis Date   Allergy    pollen  Anxiety    Arthritis    right hip   Bipolar 1 disorder (HCC)    Cancer (HCC)    rectal   Chemotherapy-induced neuropathy (HCC) 10/24/2022   Chronic kidney disease    COPD (chronic obstructive pulmonary disease) (HCC)    Depression    Family history of adverse reaction to anesthesia    grand father had a stroke during anesthesia   Family history of breast cancer    Family history of colon cancer    Family history of uterine cancer    GERD (gastroesophageal reflux disease)    History of kidney stones    Hyperlipidemia    Hypertension    Hypothyroidism    Panic attack    Pneumonia    Psoriasis    Sleep apnea 08/11/2021   No CPAP   Type 2 diabetes mellitus with microalbuminuria, without long-term current use of insulin  (HCC) 06/24/2019    SURGICAL HISTORY: Past Surgical History:  Procedure Laterality Date   BREAST BIOPSY Left 12/14/2021   us  bx, venus  marker, path pending   CESAREAN SECTION     COLONOSCOPY WITH PROPOFOL  N/A 02/09/2022   Procedure: COLONOSCOPY WITH PROPOFOL ;  Surgeon: Selena Daily, MD;  Location: [facility];  Service: Endoscopy;  Laterality: N/A;  sleep apnea   CYSTOSCOPY W/ RETROGRADES Left 11/08/2018   Procedure: CYSTOSCOPY WITH RETROGRADE PYELOGRAM;  Surgeon: Lawerence Pressman, MD;  Location: [facility];  Service: Urology;  Laterality: Left;   CYSTOSCOPY/URETEROSCOPY/HOLMIUM LASER/STENT PLACEMENT Left 11/08/2018   Procedure: CYSTOSCOPY/URETEROSCOPY/HOLMIUM LASER/STENT PLACEMENT;  Surgeon: Lawerence Pressman, MD;  Location: [facility];  Service: Urology;  Laterality: Left;   IR CV LINE INJECTION  06/25/2023   IR IMAGING GUIDED PORT INSERTION  05/11/2022   IR REMOVE CV FIBRIN SHEATH  06/27/2023   MOUTH SURGERY     wisdom teeth extraction   MOUTH SURGERY     teeth removal   POLYPECTOMY N/A 02/09/2022   Procedure: POLYPECTOMY;  Surgeon: Selena Daily, MD;  Location: [facility];  Service: Endoscopy;  Laterality: N/A;   XI ROBOTIC ASSISTED LOWER ANTERIOR RESECTION N/A 04/21/2022   Procedure: XI ROBOTIC ASSISTED LOWER ANTERIOR RESECTION WITH COLOSTOMY, BILATERAL TAP BLOCK, ASSESSMENT OF TISSUE PERFUSSION WITH FIREFLY INJECTION;  Surgeon: Melvenia Stabs, MD;  Location: [facility];  Service: General;  Laterality: N/A;    SOCIAL HISTORY: Social History   Socioeconomic History   Marital status: Married    Spouse name: Ava Lei   Number of children: 1   Years of education: 12   Highest education level: High school graduate  Occupational History   Occupation: unemployed    Comment: disabled  Tobacco Use   Smoking status: Some Days    Current packs/day: 0.25    Average packs/day: 0.3 packs/day for 39.0 years (9.8 ttl pk-yrs)    Types: Cigarettes    Start date: 05/13/1985    Passive exposure: Current   Smokeless tobacco: Never   Tobacco comments:    5-6 cigarettes weekly- 11/07/2022   Vaping Use   Vaping status: Former   Start date: 05/25/2018   Quit date: 09/24/2018  Substance and Sexual Activity   Alcohol use: Not Currently    Alcohol/week: 4.0 standard drinks of alcohol    Types: 4 Glasses of wine per week    Comment: quit ETOH in Feb. 2023   Drug use: Yes    Types: Marijuana    Comment: 2 days ago   Sexual activity: Not Currently  Birth control/protection: Post-menopausal  Other Topics Concern   Not on file  Social History Narrative   Lives with husband and son    Social Drivers of Health   Financial Resource Strain: High Risk (08/30/2023)   Overall Financial Resource Strain (CARDIA)    Difficulty of Paying Living Expenses: Hard  Food Insecurity: Food Insecurity Present (08/30/2023)   Hunger Vital Sign    Worried About Running Out of Food in the Last Year: Sometimes true    Ran Out of Food in the Last Year: Sometimes true  Transportation Needs: No Transportation Needs (08/30/2023)   PRAPARE - Administrator, Civil Service (Medical): No    Lack of Transportation (Non-Medical): No  Physical Activity: Inactive (08/30/2023)   Exercise Vital Sign    Days of Exercise per Week: 0 days    Minutes of Exercise per Session: 0 min  Stress: Stress Concern Present (08/30/2023)   [employer] of Occupational Health - Occupational Stress Questionnaire    Feeling of Stress : Very much  Social Connections: Moderately Integrated (08/30/2023)   Social Connection and Isolation Panel [NHANES]    Frequency of Communication with Friends and Family: Never    Frequency of Social Gatherings with Friends and Family: Once a week    Attends Religious Services: More than 4 times per year    Active Member of [employer] or Organizations: No    Attends Engineer, structural: 1 to 4 times per year    Marital Status: Married  Catering manager Violence: Not At Risk (08/30/2023)   Humiliation, Afraid, Rape, and Kick questionnaire    Fear of Current or Ex-Partner: No     Emotionally Abused: No    Physically Abused: No    Sexually Abused: No    FAMILY HISTORY: Family History  Problem Relation Age of Onset   Depression Mother    Anxiety disorder Mother    Diabetes Mother    Hypertension Mother    Hyperlipidemia Mother    Cancer Mother    Uterine cancer Mother 76   Cervical cancer Mother 35   Colon cancer Father    Depression Brother    Anxiety disorder Brother    Cancer Maternal Aunt        unk types   Diabetes Mellitus II Maternal Grandmother    Hypercholesterolemia Maternal Grandmother    Breast cancer Maternal Grandmother    Cancer Paternal Grandmother    Diabetes Paternal Grandmother    Melanoma Paternal Grandmother    Stomach cancer Paternal Grandmother     ALLERGIES:  is allergic to metformin and related, nsaids, perphenazine, sulfa antibiotics, abilify [aripiprazole], and penicillins.  MEDICATIONS:  Current Outpatient Medications  Medication Sig Dispense Refill   acetaminophen  (TYLENOL ) 325 MG tablet Take 650 mg by mouth every 6 (six) hours as needed for headache (pain).     albuterol  (VENTOLIN  HFA) 108 (90 Base) MCG/ACT inhaler Inhale 2 puffs into the lungs every 6 (six) hours as needed for wheezing or shortness of breath. 1 each 5   amantadine  (SYMMETREL ) 100 MG capsule Take 100 mg by mouth 2 (two) times daily.     blood glucose meter kit and supplies Dispense based on patient and insurance preference. Use up to four times daily as directed. (FOR ICD-10 E10.9, E11.9). 1 each 0   Budeson-Glycopyrrol-Formoterol  (BREZTRI  AEROSPHERE) 160-9-4.8 MCG/ACT AERO Inhale 2 puffs into the lungs in the morning and at bedtime. 10.7 g 2   Cholecalciferol  (VITAMIN D -3) 125 MCG (5000 UT)  TABS Take 5,000 Units by mouth daily. 30 tablet 1   clonazePAM (KLONOPIN) 0.5 MG tablet Take 1 mg by mouth 2 (two) times daily as needed.     dexamethasone  (DECADRON ) 4 MG tablet Take 2 tablets (8 mg total) by mouth See admin instructions. Take 8mg  daily for 2 days  after each chemotherapy. 30 tablet 1   diltiazem  (CARDIZEM  CD) 120 MG 24 hr capsule TAKE 1 CAPSULE BY MOUTH DAILY 90 capsule 1   docusate sodium  (COLACE) 100 MG capsule TAKE 1 CAPSULE BY MOUTH TWICE DAILY 60 capsule 2   FARXIGA  10 MG TABS tablet TAKE 1 TABLET BY MOUTH DAILY BEFORE BREAKFAST 90 tablet 0   gabapentin  (NEURONTIN ) 300 MG capsule Take 300 mg by mouth 2 (two) times daily.     icosapent  Ethyl (VASCEPA ) 1 g capsule Take 2 capsules (2 g total) by mouth in the morning and at bedtime. 120 capsule 2   levothyroxine  (SYNTHROID ) 75 MCG tablet TAKE 1 TABLET BY MOUTH DAILY BEFORE BREAKFAST 90 tablet 2   lidocaine -prilocaine  (EMLA ) cream Apply to affected area once 30 g 3   loperamide  (IMODIUM ) 2 MG capsule Take 1 capsule (2 mg total) by mouth See admin instructions. Initial: 4 mg,the 2 mg every 2 hours (4 mg every 4 hours at night)  maximum: 16 mg/day 120 capsule 2   loratadine  (CLARITIN ) 10 MG tablet Take 10 mg by mouth every morning.     magic mouthwash w/lidocaine  SOLN Take 5 mLs by mouth 4 (four) times daily as needed for mouth pain. Sig: Swish/Swallow 5-10 ml four times a day as needed. Dispense 480 ml. 1RF 480 mL 1   ondansetron  (ZOFRAN ) 8 MG tablet Take 1 tablet (8 mg total) by mouth every 8 (eight) hours as needed for nausea, vomiting or refractory nausea / vomiting. Start on the third day after chemotherapy. 30 tablet 1   paliperidone  (INVEGA  SUSTENNA) 234 MG/1.5ML SUSY injection Inject 234 mg into the muscle every 30 (thirty) days. On or about the 14th of each month     pantoprazole  (PROTONIX ) 40 MG tablet TAKE 1 TABLET BY MOUTH DAILY 90 tablet 2   pioglitazone  (ACTOS ) 15 MG tablet Take 1 tablet (15 mg total) by mouth daily. 90 tablet 1   potassium chloride  SA (KLOR-CON  M) 20 MEQ tablet Take 1 tablet (20 mEq total) by mouth daily. 30 tablet 0   promethazine  (PHENERGAN ) 12.5 MG tablet Take 1 tablet (12.5 mg total) by mouth every 6 (six) hours as needed for nausea or vomiting. 30 tablet 0    rosuvastatin  (CRESTOR ) 40 MG tablet TAKE 1 TABLET BY MOUTH EVERY MORNING 90 tablet 0   Spacer/Aero-Holding Chambers (AEROCHAMBER MV) inhaler Use as instructed 1 each 0   Vibegron  (GEMTESA ) 75 MG TABS Take 1 tablet (75 mg total) by mouth daily.     buPROPion  (WELLBUTRIN  XL) 300 MG 24 hr tablet Take 300 mg by mouth daily. (Patient not taking: Reported on 05/20/2024)     sertraline  (ZOLOFT ) 100 MG tablet Take 200 mg by mouth every morning. (Patient not taking: Reported on 04/21/2024)     No current facility-administered medications for this visit.   Facility-Administered Medications Ordered in Other Visits  Medication Dose Route Frequency Provider Last Rate Last Admin   albuterol  (PROVENTIL ) (2.5 MG/3ML) 0.083% nebulizer solution 2.5 mg  2.5 mg Nebulization Once Dgayli, Khabib, MD       sodium chloride  flush (NS) 0.9 % injection 10 mL  10 mL Intracatheter PRN Timmy Forbes, MD  10 mL at 08/15/23 1327   sodium chloride  flush (NS) 0.9 % injection 10 mL  10 mL Intracatheter PRN Timmy Forbes, MD   10 mL at 03/26/24 1255     PHYSICAL EXAMINATION: ECOG PERFORMANCE STATUS: 0 - Asymptomatic Vitals:   05/20/24 0913  BP: 135/87  Pulse: 88  Resp: 18  Temp: (!) 96 F (35.6 C)  SpO2: 100%    Filed Weights   05/20/24 0913  Weight: 126 lb 14.4 oz (57.6 kg)     Physical Exam Constitutional:      General: She is not in acute distress. HENT:     Head: Normocephalic and atraumatic.  Eyes:     General: No scleral icterus. Cardiovascular:     Rate and Rhythm: Normal rate and regular rhythm.  Pulmonary:     Effort: Pulmonary effort is normal. No respiratory distress.     Breath sounds: No wheezing.     Comments: Decreased breath sound bilaterally Abdominal:     General: Bowel sounds are normal. There is no distension.     Palpations: Abdomen is soft.     Comments: +colostomy   Musculoskeletal:        General: No deformity. Normal range of motion.     Cervical back: Normal range of motion and neck  supple.  Skin:    General: Skin is warm and dry.     Findings: No erythema or rash.  Neurological:     Mental Status: She is alert and oriented to person, place, and time. Mental status is at baseline.     Cranial Nerves: No cranial nerve deficit.     Coordination: Coordination normal.  Psychiatric:        Mood and Affect: Mood normal.     LABORATORY DATA:  I have reviewed the data as listed    Latest Ref Rng & Units 05/20/2024    8:14 AM 05/05/2024    8:31 AM 04/21/2024    8:15 AM  CBC  WBC 4.0 - 10.5 K/uL 2.8  3.4  3.0   Hemoglobin 12.0 - 15.0 g/dL 95.6  21.3  08.6   Hematocrit 36.0 - 46.0 % 39.4  40.0  39.7   Platelets 150 - 400 K/uL 174  140  144       Latest Ref Rng & Units 05/20/2024    8:14 AM 05/05/2024    8:31 AM 04/21/2024    8:15 AM  CMP  Glucose 70 - 99 mg/dL 578  469  629   BUN 6 - 20 mg/dL 10  17  13    Creatinine 0.44 - 1.00 mg/dL 5.28  4.13  2.44   Sodium 135 - 145 mmol/L 140  138  138   Potassium 3.5 - 5.1 mmol/L 3.4  4.0  3.6   Chloride 98 - 111 mmol/L 109  105  108   CO2 22 - 32 mmol/L 22  24  21    Calcium  8.9 - 10.3 mg/dL 8.3  9.1  8.5   Total Protein 6.5 - 8.1 g/dL 5.8  6.5  6.3   Total Bilirubin 0.0 - 1.2 mg/dL 0.5  0.4  0.2   Alkaline Phos 38 - 126 U/L 67  75  78   AST 15 - 41 U/L 20  20  17    ALT 0 - 44 U/L 13  17  13          RADIOGRAPHIC STUDIES: I have personally reviewed the radiological images as listed and agreed with the  findings in the report. No results found.

## 2024-05-20 NOTE — Assessment & Plan Note (Signed)
 History of Stage III. pT3 pN1b cM0, s/p APR. 05/2023 Stage IV current rectal adenocarcinoma Currently on adjuvant chemotherapy. S/p FOLFOX x 4 cycles S/p concurrent chemotherapy [Xeloda  1300 mg BID] with RT, and then another 4 cycles of adjuvant FOLFOX [dose reduced oxaliplatin  and omit 5-FU bolus 05/2023 CT imaging indicates disease progression. liver biopsy pathology showed metastatic carcinoma--> FOLFIRI + Bevacizumab    NGS showed TP53 missense variant, APC frameshift, TCF7L2 Frameshift, FLT3 copy number gain., TMB 4.7, pMMR Wildtype KRAS/NRAS Labs are reviewed and discussed with patient. Hold FOLFIRI + Bevacizumab  due to fatigue and diarrhea

## 2024-05-20 NOTE — Progress Notes (Signed)
 Nutrition Follow-up:  Patient with stage II adenocarcinoma of rectum.  Patient receiving folfiri and bevacizumab .    Saw that patient was in infusion today.  Planning to see patient on 5/29 but pump removal appointment cancelled and did not want patient to have to come back.    Met with patient during infusion.  Nursing having trouble getting port to work.  Patient says that she does not want to be here.  Says that she is feeling nauseated after getting pump flushed today.  Does not feel well or feel like talking with RD today.    Medications: reviewed  Labs: reviewed  Anthropometrics:   Weight 126 lb 14.4 oz today 129 lb 3.2 oz on 4/28 132 lb 11.2 oz on 11/10/22 (last seen by RD)   NUTRITION DIAGNOSIS: Inadequate oral intake stable    INTERVENTION:  Patient not feeling well.      MONITORING, EVALUATION, GOAL: weight trends, intake   NEXT VISIT: Tuesday, June 10 during infusion  Alfons Sulkowski B. Zollie Hipp, CSO, LDN Registered Dietitian [phone]

## 2024-05-20 NOTE — Assessment & Plan Note (Signed)
 Recommend oral KCL 20meq daily.

## 2024-05-20 NOTE — Assessment & Plan Note (Signed)
 Grade 2  Continue gabapentin 300mg  BID.

## 2024-05-20 NOTE — Assessment & Plan Note (Signed)
Recommend Imodium PRN as directed.

## 2024-05-21 LAB — CEA: CEA: 4.2 ng/mL (ref 0.0–4.7)

## 2024-05-22 ENCOUNTER — Encounter: Payer: Self-pay | Admitting: Oncology

## 2024-05-22 ENCOUNTER — Inpatient Hospital Stay

## 2024-05-22 ENCOUNTER — Encounter: Payer: Self-pay | Admitting: Nurse Practitioner

## 2024-05-27 ENCOUNTER — Ambulatory Visit
Admission: RE | Admit: 2024-05-27 | Discharge: 2024-05-27 | Disposition: A | Discharge status: Home / Self Care | Source: Ambulatory Visit | Attending: Oncology | Admitting: Oncology

## 2024-05-27 ENCOUNTER — Inpatient Hospital Stay: Attending: Oncology

## 2024-05-27 DIAGNOSIS — E039 Hypothyroidism, unspecified: Secondary | ICD-10-CM | POA: Insufficient documentation

## 2024-05-27 DIAGNOSIS — C20 Malignant neoplasm of rectum: Secondary | ICD-10-CM | POA: Insufficient documentation

## 2024-05-27 DIAGNOSIS — N200 Calculus of kidney: Secondary | ICD-10-CM | POA: Insufficient documentation

## 2024-05-27 DIAGNOSIS — I700 Atherosclerosis of aorta: Secondary | ICD-10-CM | POA: Insufficient documentation

## 2024-05-27 DIAGNOSIS — Z933 Colostomy status: Secondary | ICD-10-CM | POA: Insufficient documentation

## 2024-05-27 DIAGNOSIS — F209 Schizophrenia, unspecified: Secondary | ICD-10-CM | POA: Insufficient documentation

## 2024-05-27 DIAGNOSIS — J449 Chronic obstructive pulmonary disease, unspecified: Secondary | ICD-10-CM | POA: Insufficient documentation

## 2024-05-27 DIAGNOSIS — I2510 Atherosclerotic heart disease of native coronary artery without angina pectoris: Secondary | ICD-10-CM | POA: Insufficient documentation

## 2024-05-27 DIAGNOSIS — J9601 Acute respiratory failure with hypoxia: Secondary | ICD-10-CM | POA: Insufficient documentation

## 2024-05-27 DIAGNOSIS — Z87442 Personal history of urinary calculi: Secondary | ICD-10-CM | POA: Insufficient documentation

## 2024-05-27 DIAGNOSIS — K219 Gastro-esophageal reflux disease without esophagitis: Secondary | ICD-10-CM | POA: Insufficient documentation

## 2024-05-27 DIAGNOSIS — Z5189 Encounter for other specified aftercare: Secondary | ICD-10-CM | POA: Insufficient documentation

## 2024-05-27 DIAGNOSIS — Z7952 Long term (current) use of systemic steroids: Secondary | ICD-10-CM | POA: Insufficient documentation

## 2024-05-27 DIAGNOSIS — Z79890 Hormone replacement therapy (postmenopausal): Secondary | ICD-10-CM | POA: Insufficient documentation

## 2024-05-27 DIAGNOSIS — E876 Hypokalemia: Secondary | ICD-10-CM | POA: Insufficient documentation

## 2024-05-27 DIAGNOSIS — E785 Hyperlipidemia, unspecified: Secondary | ICD-10-CM | POA: Insufficient documentation

## 2024-05-27 DIAGNOSIS — J441 Chronic obstructive pulmonary disease with (acute) exacerbation: Secondary | ICD-10-CM | POA: Insufficient documentation

## 2024-05-27 DIAGNOSIS — Z800 Family history of malignant neoplasm of digestive organs: Secondary | ICD-10-CM | POA: Insufficient documentation

## 2024-05-27 DIAGNOSIS — F17210 Nicotine dependence, cigarettes, uncomplicated: Secondary | ICD-10-CM | POA: Insufficient documentation

## 2024-05-27 DIAGNOSIS — G4730 Sleep apnea, unspecified: Secondary | ICD-10-CM | POA: Insufficient documentation

## 2024-05-27 DIAGNOSIS — F319 Bipolar disorder, unspecified: Secondary | ICD-10-CM | POA: Insufficient documentation

## 2024-05-27 DIAGNOSIS — R590 Localized enlarged lymph nodes: Secondary | ICD-10-CM | POA: Insufficient documentation

## 2024-05-27 DIAGNOSIS — Z5111 Encounter for antineoplastic chemotherapy: Secondary | ICD-10-CM | POA: Insufficient documentation

## 2024-05-27 DIAGNOSIS — C787 Secondary malignant neoplasm of liver and intrahepatic bile duct: Secondary | ICD-10-CM | POA: Insufficient documentation

## 2024-05-27 DIAGNOSIS — E1122 Type 2 diabetes mellitus with diabetic chronic kidney disease: Secondary | ICD-10-CM | POA: Insufficient documentation

## 2024-05-27 DIAGNOSIS — I10 Essential (primary) hypertension: Secondary | ICD-10-CM | POA: Insufficient documentation

## 2024-05-27 DIAGNOSIS — R634 Abnormal weight loss: Secondary | ICD-10-CM | POA: Insufficient documentation

## 2024-05-27 DIAGNOSIS — Z7984 Long term (current) use of oral hypoglycemic drugs: Secondary | ICD-10-CM | POA: Insufficient documentation

## 2024-05-27 DIAGNOSIS — Z803 Family history of malignant neoplasm of breast: Secondary | ICD-10-CM | POA: Insufficient documentation

## 2024-05-27 DIAGNOSIS — R197 Diarrhea, unspecified: Secondary | ICD-10-CM | POA: Insufficient documentation

## 2024-05-27 DIAGNOSIS — G620 Drug-induced polyneuropathy: Secondary | ICD-10-CM | POA: Insufficient documentation

## 2024-05-27 DIAGNOSIS — I129 Hypertensive chronic kidney disease with stage 1 through stage 4 chronic kidney disease, or unspecified chronic kidney disease: Secondary | ICD-10-CM | POA: Insufficient documentation

## 2024-05-27 DIAGNOSIS — E1140 Type 2 diabetes mellitus with diabetic neuropathy, unspecified: Secondary | ICD-10-CM | POA: Insufficient documentation

## 2024-05-27 MED ORDER — IOHEXOL 300 MG/ML  SOLN
100.0000 mL | Freq: Once | INTRAMUSCULAR | Status: AC | PRN
  Administered 2024-05-27: 100 mL via INTRAVENOUS

## 2024-05-31 ENCOUNTER — Other Ambulatory Visit: Payer: Self-pay | Admitting: Family Medicine

## 2024-05-31 DIAGNOSIS — I10 Essential (primary) hypertension: Secondary | ICD-10-CM

## 2024-05-31 DIAGNOSIS — E1129 Type 2 diabetes mellitus with other diabetic kidney complication: Secondary | ICD-10-CM

## 2024-05-31 DIAGNOSIS — R000 Tachycardia, unspecified: Secondary | ICD-10-CM

## 2024-06-02 MED FILL — Fluorouracil IV Soln 5 GM/100ML (50 MG/ML): INTRAVENOUS | Qty: 77 | Status: AC

## 2024-06-03 ENCOUNTER — Encounter: Payer: Self-pay | Admitting: Oncology

## 2024-06-03 ENCOUNTER — Inpatient Hospital Stay

## 2024-06-03 ENCOUNTER — Inpatient Hospital Stay (HOSPITAL_BASED_OUTPATIENT_CLINIC_OR_DEPARTMENT_OTHER): Admitting: Oncology

## 2024-06-03 VITALS — BP 137/84 | HR 86 | Temp 96.0°F | Resp 18 | Wt 123.8 lb

## 2024-06-03 VITALS — BP 147/89 | HR 80

## 2024-06-03 DIAGNOSIS — C20 Malignant neoplasm of rectum: Secondary | ICD-10-CM

## 2024-06-03 DIAGNOSIS — I700 Atherosclerosis of aorta: Secondary | ICD-10-CM | POA: Diagnosis not present

## 2024-06-03 DIAGNOSIS — J441 Chronic obstructive pulmonary disease with (acute) exacerbation: Secondary | ICD-10-CM | POA: Diagnosis not present

## 2024-06-03 DIAGNOSIS — R634 Abnormal weight loss: Secondary | ICD-10-CM | POA: Diagnosis not present

## 2024-06-03 DIAGNOSIS — E785 Hyperlipidemia, unspecified: Secondary | ICD-10-CM | POA: Diagnosis not present

## 2024-06-03 DIAGNOSIS — T451X5A Adverse effect of antineoplastic and immunosuppressive drugs, initial encounter: Secondary | ICD-10-CM

## 2024-06-03 DIAGNOSIS — F319 Bipolar disorder, unspecified: Secondary | ICD-10-CM | POA: Diagnosis not present

## 2024-06-03 DIAGNOSIS — E039 Hypothyroidism, unspecified: Secondary | ICD-10-CM | POA: Diagnosis not present

## 2024-06-03 DIAGNOSIS — J449 Chronic obstructive pulmonary disease, unspecified: Secondary | ICD-10-CM | POA: Diagnosis not present

## 2024-06-03 DIAGNOSIS — Z5111 Encounter for antineoplastic chemotherapy: Secondary | ICD-10-CM | POA: Diagnosis not present

## 2024-06-03 DIAGNOSIS — E1140 Type 2 diabetes mellitus with diabetic neuropathy, unspecified: Secondary | ICD-10-CM | POA: Diagnosis not present

## 2024-06-03 DIAGNOSIS — I10 Essential (primary) hypertension: Secondary | ICD-10-CM | POA: Diagnosis not present

## 2024-06-03 DIAGNOSIS — J9601 Acute respiratory failure with hypoxia: Secondary | ICD-10-CM | POA: Diagnosis not present

## 2024-06-03 DIAGNOSIS — I2510 Atherosclerotic heart disease of native coronary artery without angina pectoris: Secondary | ICD-10-CM | POA: Diagnosis not present

## 2024-06-03 DIAGNOSIS — I129 Hypertensive chronic kidney disease with stage 1 through stage 4 chronic kidney disease, or unspecified chronic kidney disease: Secondary | ICD-10-CM | POA: Diagnosis not present

## 2024-06-03 DIAGNOSIS — F209 Schizophrenia, unspecified: Secondary | ICD-10-CM | POA: Diagnosis not present

## 2024-06-03 DIAGNOSIS — K521 Toxic gastroenteritis and colitis: Secondary | ICD-10-CM | POA: Diagnosis not present

## 2024-06-03 DIAGNOSIS — Z5189 Encounter for other specified aftercare: Secondary | ICD-10-CM | POA: Diagnosis not present

## 2024-06-03 DIAGNOSIS — C787 Secondary malignant neoplasm of liver and intrahepatic bile duct: Secondary | ICD-10-CM | POA: Diagnosis not present

## 2024-06-03 DIAGNOSIS — R197 Diarrhea, unspecified: Secondary | ICD-10-CM | POA: Diagnosis not present

## 2024-06-03 DIAGNOSIS — N200 Calculus of kidney: Secondary | ICD-10-CM | POA: Diagnosis not present

## 2024-06-03 DIAGNOSIS — E876 Hypokalemia: Secondary | ICD-10-CM | POA: Diagnosis not present

## 2024-06-03 DIAGNOSIS — K219 Gastro-esophageal reflux disease without esophagitis: Secondary | ICD-10-CM | POA: Diagnosis not present

## 2024-06-03 DIAGNOSIS — R590 Localized enlarged lymph nodes: Secondary | ICD-10-CM | POA: Diagnosis not present

## 2024-06-03 DIAGNOSIS — G620 Drug-induced polyneuropathy: Secondary | ICD-10-CM

## 2024-06-03 DIAGNOSIS — E1122 Type 2 diabetes mellitus with diabetic chronic kidney disease: Secondary | ICD-10-CM | POA: Diagnosis not present

## 2024-06-03 LAB — CBC WITH DIFFERENTIAL (CANCER CENTER ONLY)
Abs Immature Granulocytes: 0.04 10*3/uL (ref 0.00–0.07)
Basophils Absolute: 0.1 10*3/uL (ref 0.0–0.1)
Basophils Relative: 1 %
Eosinophils Absolute: 0.1 10*3/uL (ref 0.0–0.5)
Eosinophils Relative: 2 %
HCT: 43.9 % (ref 36.0–46.0)
Hemoglobin: 14.3 g/dL (ref 12.0–15.0)
Immature Granulocytes: 1 %
Lymphocytes Relative: 9 %
Lymphs Abs: 0.7 10*3/uL (ref 0.7–4.0)
MCH: 33.1 pg (ref 26.0–34.0)
MCHC: 32.6 g/dL (ref 30.0–36.0)
MCV: 101.6 fL — ABNORMAL HIGH (ref 80.0–100.0)
Monocytes Absolute: 0.6 10*3/uL (ref 0.1–1.0)
Monocytes Relative: 7 %
Neutro Abs: 6.4 10*3/uL (ref 1.7–7.7)
Neutrophils Relative %: 80 %
Platelet Count: 147 10*3/uL — ABNORMAL LOW (ref 150–400)
RBC: 4.32 MIL/uL (ref 3.87–5.11)
RDW: 15 % (ref 11.5–15.5)
WBC Count: 7.9 10*3/uL (ref 4.0–10.5)
nRBC: 0 % (ref 0.0–0.2)

## 2024-06-03 LAB — CMP (CANCER CENTER ONLY)
ALT: 12 U/L (ref 0–44)
AST: 17 U/L (ref 15–41)
Albumin: 3.7 g/dL (ref 3.5–5.0)
Alkaline Phosphatase: 77 U/L (ref 38–126)
Anion gap: 10 (ref 5–15)
BUN: 14 mg/dL (ref 6–20)
CO2: 25 mmol/L (ref 22–32)
Calcium: 8.7 mg/dL — ABNORMAL LOW (ref 8.9–10.3)
Chloride: 105 mmol/L (ref 98–111)
Creatinine: 0.99 mg/dL (ref 0.44–1.00)
GFR, Estimated: >60 mL/min (ref >60)
Glucose, Bld: 139 mg/dL — ABNORMAL HIGH (ref 70–99)
Potassium: 3.7 mmol/L (ref 3.5–5.1)
Sodium: 140 mmol/L (ref 135–145)
Total Bilirubin: 0.4 mg/dL (ref 0.0–1.2)
Total Protein: 6.9 g/dL (ref 6.5–8.1)

## 2024-06-03 LAB — TOTAL PROTEIN, URINE DIPSTICK: Protein, ur: NEGATIVE mg/dL

## 2024-06-03 MED ORDER — DEXAMETHASONE SODIUM PHOSPHATE 10 MG/ML IJ SOLN
10.0000 mg | Freq: Once | INTRAMUSCULAR | Status: AC
  Administered 2024-06-03: 10 mg via INTRAVENOUS
  Filled 2024-06-03: qty 1

## 2024-06-03 MED ORDER — ATROPINE SULFATE 1 MG/ML IV SOLN: 0.5000 mg | Freq: Once | INTRAVENOUS | Status: DC | PRN

## 2024-06-03 MED ORDER — SODIUM CHLORIDE 0.9 % IV SOLN
180.0000 mg/m2 | Freq: Once | INTRAVENOUS | Status: AC
  Administered 2024-06-03: 300 mg via INTRAVENOUS
  Filled 2024-06-03: qty 15

## 2024-06-03 MED ORDER — SODIUM CHLORIDE 0.9 % IV SOLN
2400.0000 mg/m2 | INTRAVENOUS | Status: DC
  Administered 2024-06-03: 3850 mg via INTRAVENOUS
  Filled 2024-06-03: qty 77

## 2024-06-03 MED ORDER — FLUOROURACIL CHEMO INJECTION 2.5 GM/50ML
400.0000 mg/m2 | Freq: Once | INTRAVENOUS | Status: AC
  Administered 2024-06-03: 650 mg via INTRAVENOUS
  Filled 2024-06-03: qty 13

## 2024-06-03 MED ORDER — ATROPINE SULFATE 1 MG/ML IV SOLN
0.5000 mg | Freq: Once | INTRAVENOUS | Status: AC
  Administered 2024-06-03: 0.5 mg via INTRAVENOUS
  Filled 2024-06-03: qty 1

## 2024-06-03 MED ORDER — SODIUM CHLORIDE 0.9 % IV SOLN
Freq: Once | INTRAVENOUS | Status: AC
  Filled 2024-06-03: qty 250

## 2024-06-03 MED ORDER — SODIUM CHLORIDE 0.9 % IV SOLN
400.0000 mg/m2 | Freq: Once | INTRAVENOUS | Status: AC
  Administered 2024-06-03: 644 mg via INTRAVENOUS
  Filled 2024-06-03: qty 25

## 2024-06-03 MED ORDER — SODIUM CHLORIDE 0.9 % IV SOLN
5.0000 mg/kg | Freq: Once | INTRAVENOUS | Status: AC
  Administered 2024-06-03: 300 mg via INTRAVENOUS
  Filled 2024-06-03: qty 12

## 2024-06-03 MED ORDER — PALONOSETRON HCL INJECTION 0.25 MG/5ML
0.2500 mg | Freq: Once | INTRAVENOUS | Status: AC
  Administered 2024-06-03: 0.25 mg via INTRAVENOUS
  Filled 2024-06-03: qty 5

## 2024-06-03 NOTE — Progress Notes (Signed)
 Nutrition Follow-up:  Patient with stage II adenocarcinoma of rectum.  Receiving folfiri and bevacizumab .    Met with patient during infusion.  Patient with eyes closed and opened briefly when RD spoke to her.  Says that her appetite is good she just needs a food bag.    Medications: reviewed  Labs: reviewed  Anthropometrics:   Weight 123 lb 12.8oz today 126 lb 14.4 oz on 5/27 129 lb 3.2 oz on 4/28 132 lb 11.2 oz on 11/10/22    NUTRITION DIAGNOSIS: Inadequate oral intake stable    INTERVENTION:  Message sent to staff regarding patient needing food bag. Patient will pick up on way out of cancer center today.     MONITORING, EVALUATION, GOAL: weight trends, intake   NEXT VISIT: as needed  Shawnda Mauney B. Zollie Hipp, CSO, LDN Registered Dietitian [phone]

## 2024-06-03 NOTE — Assessment & Plan Note (Addendum)
 Nutritionist follow up  She will get some food from food [facility] at cancer center

## 2024-06-03 NOTE — Assessment & Plan Note (Addendum)
 History of Stage III. pT3 pN1b cM0, s/p APR. 05/2023 Stage IV current rectal adenocarcinoma Currently on adjuvant chemotherapy. S/p FOLFOX x 4 cycles S/p concurrent chemotherapy [Xeloda  1300 mg BID] with RT, and then another 4 cycles of adjuvant FOLFOX [dose reduced oxaliplatin  and omit 5-FU bolus 05/2023 CT imaging indicates disease progression. liver biopsy pathology showed metastatic carcinoma--> FOLFIRI + Bevacizumab    NGS showed TP53 missense variant, APC frameshift, TCF7L2 Frameshift, FLT3 copy number gain., TMB 4.7, pMMR Wildtype KRAS/NRAS Labs are reviewed and discussed with patient. Proceed with  FOLFIRI + Bevacizumab 

## 2024-06-03 NOTE — Assessment & Plan Note (Signed)
 Grade 2  Continue gabapentin 300mg  BID.

## 2024-06-03 NOTE — Assessment & Plan Note (Signed)
 Chemotherapy plan as listed above

## 2024-06-03 NOTE — Progress Notes (Signed)
 Hematology/Oncology Progress note [phone] [phone]      CHIEF COMPLAINTS/REASON FOR VISIT:  Follow-up for Stage IV  rectal cancer treatments.  ASSESSMENT & PLAN:   Cancer Staging  Rectal cancer [facility]) Staging form: Colon and Rectum, AJCC 8th Edition - Pathologic stage from 05/03/2022: Stage IIIB (pT3, pN1b, cM0) - Signed by Timmy Forbes, MD on 05/03/2022 - Pathologic stage from 06/18/2023: Stage IVA (rpTX, pN0, pM1a) - Signed by Timmy Forbes, MD on 06/18/2023   Rectal cancer [facility]) History of Stage III. pT3 pN1b cM0, s/p APR. 05/2023 Stage IV current rectal adenocarcinoma Currently on adjuvant chemotherapy. S/p FOLFOX x 4 cycles S/p concurrent chemotherapy [Xeloda  1300 mg BID] with RT, and then another 4 cycles of adjuvant FOLFOX [dose reduced oxaliplatin  and omit 5-FU bolus 05/2023 CT imaging indicates disease progression. liver biopsy pathology showed metastatic carcinoma--> FOLFIRI + Bevacizumab    NGS showed TP53 missense variant, APC frameshift, TCF7L2 Frameshift, FLT3 copy number gain., TMB 4.7, pMMR Wildtype KRAS/NRAS  Labs are reviewed and discussed with patient. Proceed with  FOLFIRI + Bevacizumab      Chemotherapy induced diarrhea Recommend Imodium  PRN as directed.   Chemotherapy-induced neuropathy (HCC) Grade 2  Continue gabapentin  300mg  BID.   Weight loss Nutritionist follow up  She will get some food from food pantry at cancer center   Hypokalemia  Recommend oral KCL 20meq daily.  Encounter for antineoplastic chemotherapy Chemotherapy plan as listed above     Orders Placed This Encounter  Procedures   Total Protein, Urine dipstick    Standing Status:   Future    Number of Occurrences:   1    Expected Date:   06/03/2024    Expiration Date:   06/03/2025   Total Protein, Urine dipstick    Standing Status:   Future    Expected Date:   06/18/2024    Expiration Date:   06/18/2025   Total Protein, Urine dipstick    Standing Status:   Future     Expected Date:   07/02/2024    Expiration Date:   07/02/2025   CEA    Standing Status:   Future    Expected Date:   07/02/2024    Expiration Date:   07/02/2025   CBC with Differential (Cancer Center Only)    Standing Status:   Future    Expected Date:   07/02/2024    Expiration Date:   07/02/2025   CMP (Cancer Center only)    Standing Status:   Future    Expected Date:   07/02/2024    Expiration Date:   07/02/2025    Follow-up 2 week(s)  All questions were answered. The patient knows to call the clinic with any problems, questions or concerns.  Timmy Forbes, MD, PhD [facility] Hematology Oncology 06/03/2024     HISTORY OF PRESENTING ILLNESS:   AI SONNENFELD is a  53 y.o.  female presents for treatment of rectal cancer Oncology History  Rectal cancer (HCC)  02/26/2022 Imaging   MRI PELVIS WITHOUT CONTRAST- By imaging, rectal cancer stage:  T1/T2, N0, Mx    03/02/2022 Imaging   CT CHEST AND ABDOMEN WITH CONTRAST 1. No convincing evidence of metastatic disease within the chest or abdomen. 2. Atrophic left kidney with multifocal renal scarring and cortical calcifications as well as nonobstructive renal stones measuring up to 5 mm. 3. Prominent left-sided predominant retroperitoneal lymph nodes measuring up to 8 mm near the level of the renal hilum, overall decreased in size dating back  to CT September 19, 2018 and favored reactive related to left renal inflammation. 4.  Aortic Atherosclerosis (ICD10-I70.0).   03/27/2022 Genetic Testing    [facility] CustomNext+RNA cancer panel found no pathogenic mutations.    04/21/2022 Initial Diagnosis   Rectal cancer - baseline CEA 3.6 -02/09/2022, patient had colonoscopy which showed renal mass 10 cm from anal verge.  5 mm polyp in ascending colon.  Removed and retrieved. Pathology showed rectal adenocarcinoma.  The polyp in the ascending colon is a tubular adenoma.  MRI showed cT1/T2N0 disease  -04/21/2022, patient underwent robotic assisted ultralow  anterior resection. Pathology showed moderately differentiated adenocarcinoma, 4.5 cm in maximal extent, with focal extension through muscularis propria into perirectal soft tissue.  3 lymph nodes positive for metastatic carcinoma.  Negative margin.  pT3 pN1b, MSI stable.   05/03/2022 Cancer Staging   Staging form: Colon and Rectum, AJCC 8th Edition - Pathologic stage from 05/03/2022: Stage IIIB (pT3, pN1b, cM0) - Signed by Timmy Forbes, MD on 05/03/2022 Stage prefix: Initial diagnosis   05/11/2022 Miscellaneous   Medi port placed by Dr.White   05/26/2022 -  Chemotherapy   FOLFOX Q2 weeks x 4   05/26/2022 - 07/09/2022 Chemotherapy   Patient is on Treatment Plan : COLORECTAL FOLFOX q14d x 8 cycles     05/26/2022 - 12/13/2022 Chemotherapy   Patient is on Treatment Plan : COLORECTAL FOLFOX q14d x 4 months     07/27/2022 -  Chemotherapy   Concurrent chemotherapy- Xeloda   1300mg  BID and radiation.    07/27/2022 - 09/12/2022 Radiation Therapy    concurrent chemotherapy [Xeloda  1300 mg BID] with RT    09/20/2022 Imaging   CT Angiogram chest PET protocol There is no evidence of pulmonary artery embolism. There is no evidence of thoracic aortic dissection.   Small linear patchy alveolar infiltrate is seen in medial segment of right middle lobe suggesting atelectasis/pneumonia.     09/28/2022 - 09/30/2022 Hospital Admission   Admission due to acute respiratory failure with hypoxia, COPD exacerbation   09/28/2022 Imaging   CT angio chest PE protocol 1. Negative for acute PE or thoracic aortic dissection. 2. New ground-glass infiltrates anteriorly in bilateral upper lobes,may represent atypical edema, infectious or inflammatory process. 3.  Aortic Atherosclerosis     12/22/2022 Imaging   CT chest abdomen pelvis w contrast  Stable exam. No evidence of recurrent or metastatic carcinoma within the chest, abdomen, or pelvis.   Left nephrolithiasis and renal parenchymal atrophy. No evidence of ureteral  calculi or hydronephrosis.   Aortic Atherosclerosis   05/31/2023 Imaging   CT chest abdomen pelvis with contrast showed 1. New hypodense 16 mm lesion in the right lobe of the liver with very subtle adjacent 5 mm lesion, suspicious for metastatic disease. 2. Small bilateral pulmonary nodules, some of which were subtly evident on prior examination only in retrospect, some of which demonstrate degree of cavitation, suspicious for metastatic disease. 3. Prominent retroperitoneal lymph nodes are similar prior. 4. Prior low anterior resection with Hartmann's pouch formation,similar small amount of perirectal/presacral soft tissue and fluid,favored postsurgical/posttreatment change. Suggest continued attention on follow-up imaging. 5. Large volume of formed stool in the colon. 6. Nonobstructive left renal calculi measure under 1 cm.     06/08/2023 Relapse/Recurrence   Ultrasound-guided liver biopsy showed Pathology showed metastatic carcinoma, morphology consistent with patient's clinical history of rectal adenocarcinoma.  [facility] NGS 648 gene panel showed TP53 missense variant, APC frameshift, TCF7L2 frameshift, FLT3 copy number gain, TMB 4.34m/MB MSI stable.  Wildtype  KRAS/NRAS  [facility] RNA Seq - ERBB3 overexpressed, VEGFA overexpressed, NRAS overexpressed.     06/18/2023 Cancer Staging   Staging form: Colon and Rectum, AJCC 8th Edition - Pathologic stage from 06/18/2023: Stage IVA (rpTX, pN0, pM1a) - Signed by Timmy Forbes, MD on 06/18/2023 Stage prefix: Recurrence Total positive nodes: 0   06/25/2023 - 06/25/2023 Chemotherapy   Patient is on Treatment Plan : COLORECTAL FOLFOX + Bevacizumab  q14d     07/02/2023 -  Chemotherapy   Patient is on Treatment Plan : COLORECTAL FOLFIRI + Bevacizumab  q14d     11/13/2023 Imaging   CT chest abdomen pelvis w contrast showed  1. Increase in cavitation with decreased soft tissue involving the scattered bilateral pulmonary nodules, consistent with  treatment response. No new suspicious pulmonary nodules or masses. 2. Decrease in size and conspicuity of the hypodense lesions in the right lobe of the liver, consistent with treatment response. No new suspicious hepatic lesion identified. 3. Stable prominent nonspecific retroperitoneal lymph nodes. Suggest continued attention on follow-up imaging. 4. Similar surgical changes of prior low anterior resection with left anterior abdominal wall colostomy. Similar mesorectal/presacral soft tissue, no new suspicious enhancing nodularity. 5. Similar soft tissue nodularity in the bilateral posterior gluteal subcutaneous soft tissues. 6. Moderate volume of formed stool in the colon. Correlate for constipation.   03/03/2024 Imaging   CT chest abdomen pelvis w contrast showed 1. 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 2. No new or progressive disease. 3. Similar prominent but not pathologically sized abdominal retroperitoneal nodes, nonspecific. 4. Status post low anterior resection with descending colostomy. Possible constipation. 5. Age advanced coronary artery atherosclerosis. Recommend assessment of coronary risk factors. 6.  Aortic Atherosclerosis (ICD10-I70.0). 7. Left nephrolithiasis and renal scarring.    Patient has bipolar and schizophrenia..  Patient is married  She has housing issue due to previous history of eviction.  she does not live with her husband currently. She lives with some family members.   + chronic dysuria and urgency she was seen by Urology and was started on Gemteza.  Symptoms are improved.    INTERVAL HISTORY NIKYA BUSLER is a 53 y.o. female who has above history reviewed by me today presents for follow up visit for rectal cancer.   Denies any melena or blood in the stool. Stable neuropathy symptoms. + loose BM, stable baseline.  +good appetite, she does not have much food at home.   Lost weight.    Review of Systems   Constitutional:  Positive for fatigue. Negative for appetite change, chills and fever.  HENT:   Negative for hearing loss, mouth sores and voice change.   Eyes:  Negative for eye problems.  Respiratory:  Negative for chest tightness, cough and shortness of breath.   Cardiovascular:  Negative for chest pain.  Gastrointestinal:  Positive for diarrhea. Negative for abdominal distention, abdominal pain, blood in stool and nausea.       + colostomy   Endocrine: Negative for hot flashes.  Genitourinary:  Negative for difficulty urinating, dysuria and frequency.   Musculoskeletal:  Negative for arthralgias.  Skin:  Negative for itching and rash.  Neurological:  Positive for numbness. Negative for extremity weakness.  Hematological:  Negative for adenopathy.  Psychiatric/Behavioral:  Negative for confusion.     MEDICAL HISTORY:  Past Medical History:  Diagnosis Date   Allergy    pollen   Anxiety    Arthritis    right hip   Bipolar 1 disorder (HCC)  Cancer [facility])    rectal   Chemotherapy-induced neuropathy (HCC) 10/24/2022   Chronic kidney disease    COPD (chronic obstructive pulmonary disease) (HCC)    Depression    Family history of adverse reaction to anesthesia    grand father had a stroke during anesthesia   Family history of breast cancer    Family history of colon cancer    Family history of uterine cancer    GERD (gastroesophageal reflux disease)    History of kidney stones    Hyperlipidemia    Hypertension    Hypothyroidism    Panic attack    Pneumonia    Psoriasis    Sleep apnea 08/11/2021   No CPAP   Type 2 diabetes mellitus with microalbuminuria, without long-term current use of insulin  (HCC) 06/24/2019    SURGICAL HISTORY: Past Surgical History:  Procedure Laterality Date   BREAST BIOPSY Left 12/14/2021   us  bx, venus marker, path pending   CESAREAN SECTION     COLONOSCOPY WITH PROPOFOL  N/A 02/09/2022   Procedure: COLONOSCOPY WITH PROPOFOL ;  Surgeon:  Selena Daily, MD;  Location: [facility];  Service: Endoscopy;  Laterality: N/A;  sleep apnea   CYSTOSCOPY W/ RETROGRADES Left 11/08/2018   Procedure: CYSTOSCOPY WITH RETROGRADE PYELOGRAM;  Surgeon: Lawerence Pressman, MD;  Location: [facility];  Service: Urology;  Laterality: Left;   CYSTOSCOPY/URETEROSCOPY/HOLMIUM LASER/STENT PLACEMENT Left 11/08/2018   Procedure: CYSTOSCOPY/URETEROSCOPY/HOLMIUM LASER/STENT PLACEMENT;  Surgeon: Lawerence Pressman, MD;  Location: [facility];  Service: Urology;  Laterality: Left;   IR CV LINE INJECTION  06/25/2023   IR IMAGING GUIDED PORT INSERTION  05/11/2022   IR REMOVE CV FIBRIN SHEATH  06/27/2023   MOUTH SURGERY     wisdom teeth extraction   MOUTH SURGERY     teeth removal   POLYPECTOMY N/A 02/09/2022   Procedure: POLYPECTOMY;  Surgeon: Selena Daily, MD;  Location: [facility];  Service: Endoscopy;  Laterality: N/A;   XI ROBOTIC ASSISTED LOWER ANTERIOR RESECTION N/A 04/21/2022   Procedure: XI ROBOTIC ASSISTED LOWER ANTERIOR RESECTION WITH COLOSTOMY, BILATERAL TAP BLOCK, ASSESSMENT OF TISSUE PERFUSSION WITH FIREFLY INJECTION;  Surgeon: Melvenia Stabs, MD;  Location: [facility];  Service: General;  Laterality: N/A;    SOCIAL HISTORY: Social History   Socioeconomic History   Marital status: Married    Spouse name: Ava Lei   Number of children: 1   Years of education: 12   Highest education level: High school graduate  Occupational History   Occupation: unemployed    Comment: disabled  Tobacco Use   Smoking status: Some Days    Current packs/day: 0.25    Average packs/day: 0.3 packs/day for 39.1 years (9.8 ttl pk-yrs)    Types: Cigarettes    Start date: 05/13/1985    Passive exposure: Current   Smokeless tobacco: Never   Tobacco comments:    5-6 cigarettes weekly- 11/07/2022  Vaping Use   Vaping status: Former   Start date: 05/25/2018   Quit date: 09/24/2018  Substance and Sexual Activity   Alcohol use: Not  Currently    Alcohol/week: 4.0 standard drinks of alcohol    Types: 4 Glasses of wine per week    Comment: quit ETOH in Feb. 2023   Drug use: Yes    Types: Marijuana    Comment: 2 days ago   Sexual activity: Not Currently    Birth control/protection: Post-menopausal  Other Topics Concern   Not on file  Social History Narrative  Lives with husband and son    Social Drivers of Health   Financial Resource Strain: High Risk (08/30/2023)   Overall Financial Resource Strain (CARDIA)    Difficulty of Paying Living Expenses: Hard  Food Insecurity: Food Insecurity Present (04/22/2024)   Hunger Vital Sign    Worried About Running Out of Food in the Last Year: Sometimes true    Ran Out of Food in the Last Year: Sometimes true  Transportation Needs: No Transportation Needs (08/30/2023)   PRAPARE - Administrator, Civil Service (Medical): No    Lack of Transportation (Non-Medical): No  Physical Activity: Inactive (08/30/2023)   Exercise Vital Sign    Days of Exercise per Week: 0 days    Minutes of Exercise per Session: 0 min  Stress: Stress Concern Present (08/30/2023)   [employer] of Occupational Health - Occupational Stress Questionnaire    Feeling of Stress : Very much  Social Connections: Moderately Integrated (08/30/2023)   Social Connection and Isolation Panel [NHANES]    Frequency of Communication with Friends and Family: Never    Frequency of Social Gatherings with Friends and Family: Once a week    Attends Religious Services: More than 4 times per year    Active Member of [employer] or Organizations: No    Attends Engineer, structural: 1 to 4 times per year    Marital Status: Married  Catering manager Violence: Not At Risk (08/30/2023)   Humiliation, Afraid, Rape, and Kick questionnaire    Fear of Current or Ex-Partner: No    Emotionally Abused: No    Physically Abused: No    Sexually Abused: No    FAMILY HISTORY: Family History  Problem Relation Age of Onset    Depression Mother    Anxiety disorder Mother    Diabetes Mother    Hypertension Mother    Hyperlipidemia Mother    Cancer Mother    Uterine cancer Mother 51   Cervical cancer Mother 26   Colon cancer Father    Depression Brother    Anxiety disorder Brother    Cancer Maternal Aunt        unk types   Diabetes Mellitus II Maternal Grandmother    Hypercholesterolemia Maternal Grandmother    Breast cancer Maternal Grandmother    Cancer Paternal Grandmother    Diabetes Paternal Grandmother    Melanoma Paternal Grandmother    Stomach cancer Paternal Grandmother     ALLERGIES:  is allergic to metformin and related, nsaids, perphenazine, sulfa antibiotics, abilify [aripiprazole], and penicillins.  MEDICATIONS:  Current Outpatient Medications  Medication Sig Dispense Refill   acetaminophen  (TYLENOL ) 325 MG tablet Take 650 mg by mouth every 6 (six) hours as needed for headache (pain).     albuterol  (VENTOLIN  HFA) 108 (90 Base) MCG/ACT inhaler Inhale 2 puffs into the lungs every 6 (six) hours as needed for wheezing or shortness of breath. 1 each 5   amantadine  (SYMMETREL ) 100 MG capsule Take 100 mg by mouth 2 (two) times daily.     blood glucose meter kit and supplies Dispense based on patient and insurance preference. Use up to four times daily as directed. (FOR ICD-10 E10.9, E11.9). 1 each 0   Budeson-Glycopyrrol-Formoterol  (BREZTRI  AEROSPHERE) 160-9-4.8 MCG/ACT AERO Inhale 2 puffs into the lungs in the morning and at bedtime. 10.7 g 2   Cholecalciferol  (VITAMIN D -3) 125 MCG (5000 UT) TABS Take 5,000 Units by mouth daily. 30 tablet 1   clonazePAM (KLONOPIN) 0.5 MG tablet Take  1 mg by mouth 2 (two) times daily as needed.     dexamethasone  (DECADRON ) 4 MG tablet Take 2 tablets (8 mg total) by mouth See admin instructions. Take 8mg  daily for 2 days after each chemotherapy. 30 tablet 1   diltiazem  (CARDIZEM  CD) 120 MG 24 hr capsule TAKE 1 CAPSULE BY MOUTH DAILY 90 capsule 1   docusate sodium   (COLACE) 100 MG capsule TAKE 1 CAPSULE BY MOUTH TWICE DAILY 60 capsule 2   FARXIGA  10 MG TABS tablet TAKE 1 TABLET BY MOUTH DAILY BEFORE BREAKFAST 90 tablet 0   gabapentin  (NEURONTIN ) 300 MG capsule Take 300 mg by mouth 2 (two) times daily.     icosapent  Ethyl (VASCEPA ) 1 g capsule Take 2 capsules (2 g total) by mouth in the morning and at bedtime. 120 capsule 2   levothyroxine  (SYNTHROID ) 75 MCG tablet TAKE 1 TABLET BY MOUTH DAILY BEFORE BREAKFAST 90 tablet 2   lidocaine -prilocaine  (EMLA ) cream Apply to affected area once 30 g 3   loperamide  (IMODIUM ) 2 MG capsule Take 1 capsule (2 mg total) by mouth See admin instructions. Initial: 4 mg,the 2 mg every 2 hours (4 mg every 4 hours at night)  maximum: 16 mg/day 120 capsule 2   loratadine  (CLARITIN ) 10 MG tablet Take 10 mg by mouth every morning.     magic mouthwash w/lidocaine  SOLN Take 5 mLs by mouth 4 (four) times daily as needed for mouth pain. Sig: Swish/Swallow 5-10 ml four times a day as needed. Dispense 480 ml. 1RF 480 mL 1   ondansetron  (ZOFRAN ) 8 MG tablet Take 1 tablet (8 mg total) by mouth every 8 (eight) hours as needed for nausea, vomiting or refractory nausea / vomiting. Start on the third day after chemotherapy. 30 tablet 1   paliperidone  (INVEGA  SUSTENNA) 234 MG/1.5ML SUSY injection Inject 234 mg into the muscle every 30 (thirty) days. On or about the 14th of each month     pantoprazole  (PROTONIX ) 40 MG tablet TAKE 1 TABLET BY MOUTH DAILY 90 tablet 2   pioglitazone  (ACTOS ) 15 MG tablet Take 1 tablet (15 mg total) by mouth daily. 90 tablet 1   potassium chloride  SA (KLOR-CON  M) 20 MEQ tablet Take 1 tablet (20 mEq total) by mouth daily. 30 tablet 0   promethazine  (PHENERGAN ) 12.5 MG tablet Take 1 tablet (12.5 mg total) by mouth every 6 (six) hours as needed for nausea or vomiting. 30 tablet 0   rosuvastatin  (CRESTOR ) 40 MG tablet TAKE 1 TABLET BY MOUTH EVERY MORNING 90 tablet 0   Spacer/Aero-Holding Chambers (AEROCHAMBER MV) inhaler Use  as instructed 1 each 0   Vibegron  (GEMTESA ) 75 MG TABS Take 1 tablet (75 mg total) by mouth daily.     buPROPion  (WELLBUTRIN  XL) 300 MG 24 hr tablet Take 300 mg by mouth daily. (Patient not taking: Reported on 05/20/2024)     sertraline  (ZOLOFT ) 100 MG tablet Take 200 mg by mouth every morning. (Patient not taking: Reported on 04/21/2024)     No current facility-administered medications for this visit.   Facility-Administered Medications Ordered in Other Visits  Medication Dose Route Frequency Provider Last Rate Last Admin   albuterol  (PROVENTIL ) (2.5 MG/3ML) 0.083% nebulizer solution 2.5 mg  2.5 mg Nebulization Once Dgayli, Khabib, MD       atropine  injection 0.5 mg  0.5 mg Intravenous Once PRN Desman Polak, MD       fluorouracil  (ADRUCIL ) 3,850 mg in sodium chloride  0.9 % 73 mL chemo infusion  2,400 mg/m2 (  Treatment Plan Recorded) Intravenous 1 day or 1 dose Timmy Forbes, MD   Infusion Verify at 06/03/24 1236   sodium chloride  flush (NS) 0.9 % injection 10 mL  10 mL Intracatheter PRN Timmy Forbes, MD   10 mL at 08/15/23 1327   sodium chloride  flush (NS) 0.9 % injection 10 mL  10 mL Intracatheter PRN Timmy Forbes, MD   10 mL at 03/26/24 1255     PHYSICAL EXAMINATION: ECOG PERFORMANCE STATUS: 0 - Asymptomatic Vitals:   06/03/24 0843  BP: 137/84  Pulse: 86  Resp: 18  Temp: (!) 96 F (35.6 C)  SpO2: 95%    Filed Weights   06/03/24 0843  Weight: 123 lb 12.8 oz (56.2 kg)     Physical Exam Constitutional:      General: She is not in acute distress. HENT:     Head: Normocephalic and atraumatic.  Eyes:     General: No scleral icterus. Cardiovascular:     Rate and Rhythm: Normal rate and regular rhythm.  Pulmonary:     Effort: Pulmonary effort is normal. No respiratory distress.     Breath sounds: No wheezing.     Comments: Decreased breath sound bilaterally Abdominal:     General: Bowel sounds are normal. There is no distension.     Palpations: Abdomen is soft.     Comments: +colostomy    Musculoskeletal:        General: No deformity. Normal range of motion.     Cervical back: Normal range of motion and neck supple.  Skin:    General: Skin is warm and dry.     Findings: No erythema or rash.  Neurological:     Mental Status: She is alert and oriented to person, place, and time. Mental status is at baseline.     Cranial Nerves: No cranial nerve deficit.     Coordination: Coordination normal.  Psychiatric:        Mood and Affect: Mood normal.     LABORATORY DATA:  I have reviewed the data as listed    Latest Ref Rng & Units 06/03/2024    8:20 AM 05/20/2024    8:14 AM 05/05/2024    8:31 AM  CBC  WBC 4.0 - 10.5 K/uL 7.9  2.8  3.4   Hemoglobin 12.0 - 15.0 g/dL 91.4  78.2  95.6   Hematocrit 36.0 - 46.0 % 43.9  39.4  40.0   Platelets 150 - 400 K/uL 147  174  140       Latest Ref Rng & Units 06/03/2024    8:20 AM 05/20/2024    8:14 AM 05/05/2024    8:31 AM  CMP  Glucose 70 - 99 mg/dL 213  086  578   BUN 6 - 20 mg/dL 14  10  17    Creatinine 0.44 - 1.00 mg/dL 4.69  6.29  5.28   Sodium 135 - 145 mmol/L 140  140  138   Potassium 3.5 - 5.1 mmol/L 3.7  3.4  4.0   Chloride 98 - 111 mmol/L 105  109  105   CO2 22 - 32 mmol/L 25  22  24    Calcium  8.9 - 10.3 mg/dL 8.7  8.3  9.1   Total Protein 6.5 - 8.1 g/dL 6.9  5.8  6.5   Total Bilirubin 0.0 - 1.2 mg/dL 0.4  0.5  0.4   Alkaline Phos 38 - 126 U/L 77  67  75   AST 15 - 41 U/L  17  20  20    ALT 0 - 44 U/L 12  13  17          RADIOGRAPHIC STUDIES: I have personally reviewed the radiological images as listed and agreed with the findings in the report. CT CHEST ABDOMEN PELVIS W CONTRAST Result Date: 05/27/2024 CLINICAL DATA:  Metastatic rectal cancer restaging * Tracking Code: BO * EXAM: CT CHEST, ABDOMEN, AND PELVIS WITH CONTRAST TECHNIQUE: Multidetector CT imaging of the chest, abdomen and pelvis was performed following the standard protocol during bolus administration of intravenous contrast. RADIATION DOSE REDUCTION: This  exam was performed according to the departmental dose-optimization program which includes automated exposure control, adjustment of the mA and/or kV according to patient size and/or use of iterative reconstruction technique. CONTRAST:  OMNIPAQUE  IOHEXOL  300 MG/ML SOLN additional oral enteric contrast COMPARISON:  02/26/2024 FINDINGS: CT CHEST FINDINGS Cardiovascular: No significant vascular findings. Normal heart size. No pericardial effusion. Mediastinum/Nodes: No enlarged mediastinal, hilar, or axillary lymph nodes. Thyroid  gland, trachea, and esophagus demonstrate no significant findings. Lungs/Pleura: Very tiny, irregular cavitary nodules are unchanged, for example in the medial posterior left lower lobe measuring 0.5 cm (series 4, image 84) and in the medial segment right middle lobe measuring 0.4 cm (series 4, image 86). New areas of irregular atelectasis or consolidation of the peripheral right lower lobe (series 4, image 103). No pleural effusion or pneumothorax. Musculoskeletal: No chest wall abnormality. No acute osseous findings. CT ABDOMEN PELVIS FINDINGS Hepatobiliary: Unchanged small hypodense lesion of the posterior right lobe of the liver, hepatic segment VII, measuring 0.8 cm (series 2, image 49). Contracted gallbladder. No gallstones, gallbladder wall thickening, or biliary dilatation. Pancreas: Unremarkable. No pancreatic ductal dilatation or surrounding inflammatory changes. Spleen: Normal in size without significant abnormality. Adrenals/Urinary Tract: Adrenal glands are unremarkable. Atrophic, scarred left kidney with nonobstructive calculi of the inferior pole. No hydronephrosis. Simple, benign bilateral renal cortical cysts, requiring no further follow-up or characterization. Bladder is unremarkable. Stomach/Bowel: Stomach is within normal limits. Appendix appears normal. No evidence of bowel wall thickening, distention, or inflammatory changes. Status post abdominoperineal resection  with left lower quadrant end colostomy. Vascular/Lymphatic: Aortic atherosclerosis. Unchanged enlarged retroperitoneal lymph nodes, index left retroperitoneal node measuring 1.4 x 1.0 cm (series 2, image 64). Reproductive: No mass or other abnormality. Other: No abdominal wall hernia. Left lower quadrant end colostomy. Injection granulomata of the buttocks. No ascites. Musculoskeletal: No acute osseous findings. IMPRESSION: 1. Status post abdominoperineal resection with left lower quadrant end colostomy. 2. Unchanged enlarged retroperitoneal lymph nodes. 3. Unchanged small hypodense lesion of the posterior right lobe of the liver. 4. Very tiny, irregular cavitary nodules are unchanged. 5. New areas of irregular atelectasis or consolidation of the peripheral right lower lobe, nonspecific and most likely infectious or inflammatory. Metastatic disease distinctly not favored. Attention on follow-up. 6. Atrophic, scarred left kidney with nonobstructive calculi of the inferior pole. No hydronephrosis. Aortic Atherosclerosis (ICD10-I70.0). Electronically Signed   By: Fredricka Jenny M.D.   On: 05/27/2024 11:25

## 2024-06-03 NOTE — Assessment & Plan Note (Signed)
Recommend Imodium PRN as directed.

## 2024-06-03 NOTE — Patient Instructions (Signed)
 [facility] - A DEPT OF [facility]  Discharge Instructions: Thank you for choosing [facility] to provide your oncology and hematology care.  If you have a lab appointment with the Cancer Center, please go directly to the Cancer Center and check in at the registration area.  Wear comfortable clothing and clothing appropriate for easy access to any Portacath or PICC line.   We strive to give you quality time with your provider. You may need to reschedule your appointment if you arrive late (15 or more minutes).  Arriving late affects you and other patients whose appointments are after yours.  Also, if you miss three or more appointments without notifying the office, you may be dismissed from the clinic at the provider's discretion.      For prescription refill requests, have your pharmacy contact our office and allow 72 hours for refills to be completed.    Today you received the following chemotherapy and/or immunotherapy agents Mvasi , Irinotecan , Leucovorin  and Adrucil        To help prevent nausea and vomiting after your treatment, we encourage you to take your nausea medication as directed.  BELOW ARE SYMPTOMS THAT SHOULD BE REPORTED IMMEDIATELY: *FEVER GREATER THAN 100.4 F (38 C) OR HIGHER *CHILLS OR SWEATING *NAUSEA AND VOMITING THAT IS NOT CONTROLLED WITH YOUR NAUSEA MEDICATION *UNUSUAL SHORTNESS OF BREATH *UNUSUAL BRUISING OR BLEEDING *URINARY PROBLEMS (pain or burning when urinating, or frequent urination) *BOWEL PROBLEMS (unusual diarrhea, constipation, pain near the anus) TENDERNESS IN MOUTH AND THROAT WITH OR WITHOUT PRESENCE OF ULCERS (sore throat, sores in mouth, or a toothache) UNUSUAL RASH, SWELLING OR PAIN  UNUSUAL VAGINAL DISCHARGE OR ITCHING   Items with * indicate a potential emergency and should be followed up as soon as possible or go to the Emergency Department if any problems should occur.  Please show the  CHEMOTHERAPY ALERT CARD or IMMUNOTHERAPY ALERT CARD at check-in to the Emergency Department and triage nurse.  Should you have questions after your visit or need to cancel or reschedule your appointment, please contact [facility] - A DEPT OF [facility]  [phone] and follow the prompts.  Office hours are 8:00 a.m. to 4:30 p.m. Monday - Friday. Please note that voicemails left after 4:00 p.m. may not be returned until the following business day.  We are closed weekends and major holidays. You have access to a nurse at all times for urgent questions. Please call the main number to the clinic [phone] and follow the prompts.  For any non-urgent questions, you may also contact your provider using MyChart. We now offer e-Visits for anyone 27 and older to request care online for non-urgent symptoms. For details visit [email or web address].   Also download the MyChart app! Go to the app store, search "MyChart", open the app, select [facility], and log in with your MyChart username and password.

## 2024-06-03 NOTE — Assessment & Plan Note (Signed)
 Recommend oral KCL 20meq daily.

## 2024-06-04 ENCOUNTER — Telehealth: Payer: Self-pay | Admitting: *Deleted

## 2024-06-04 LAB — CEA: CEA: 3.7 ng/mL (ref 0.0–4.7)

## 2024-06-04 NOTE — Telephone Encounter (Signed)
 I called the patient and told her to look for the  a sticker on the side of the machine and call that number and they will help you with the pump. She was thankful with this issue .

## 2024-06-05 ENCOUNTER — Inpatient Hospital Stay

## 2024-06-05 ENCOUNTER — Encounter

## 2024-06-05 VITALS — BP 129/87 | HR 82 | Temp 97.2°F | Resp 18

## 2024-06-05 DIAGNOSIS — C20 Malignant neoplasm of rectum: Secondary | ICD-10-CM | POA: Diagnosis not present

## 2024-06-05 MED ORDER — HEPARIN SOD (PORK) LOCK FLUSH 100 UNIT/ML IV SOLN
500.0000 [IU] | Freq: Once | INTRAVENOUS | Status: AC | PRN
  Administered 2024-06-05: 500 [IU]
  Filled 2024-06-05: qty 5

## 2024-06-05 MED ORDER — SODIUM CHLORIDE 0.9% FLUSH
10.0000 mL | INTRAVENOUS | Status: DC | PRN
  Administered 2024-06-05: 10 mL
  Filled 2024-06-05: qty 10

## 2024-06-18 ENCOUNTER — Inpatient Hospital Stay

## 2024-06-18 ENCOUNTER — Encounter: Payer: Self-pay | Admitting: Oncology

## 2024-06-18 ENCOUNTER — Inpatient Hospital Stay (HOSPITAL_BASED_OUTPATIENT_CLINIC_OR_DEPARTMENT_OTHER): Admitting: Oncology

## 2024-06-18 VITALS — BP 119/86 | HR 81 | Temp 95.7°F | Resp 18 | Wt 122.0 lb

## 2024-06-18 DIAGNOSIS — E876 Hypokalemia: Secondary | ICD-10-CM | POA: Diagnosis not present

## 2024-06-18 DIAGNOSIS — Z95828 Presence of other vascular implants and grafts: Secondary | ICD-10-CM

## 2024-06-18 DIAGNOSIS — C20 Malignant neoplasm of rectum: Secondary | ICD-10-CM

## 2024-06-18 DIAGNOSIS — R634 Abnormal weight loss: Secondary | ICD-10-CM

## 2024-06-18 DIAGNOSIS — T451X5A Adverse effect of antineoplastic and immunosuppressive drugs, initial encounter: Secondary | ICD-10-CM

## 2024-06-18 DIAGNOSIS — K521 Toxic gastroenteritis and colitis: Secondary | ICD-10-CM

## 2024-06-18 DIAGNOSIS — Z5111 Encounter for antineoplastic chemotherapy: Secondary | ICD-10-CM

## 2024-06-18 DIAGNOSIS — G620 Drug-induced polyneuropathy: Secondary | ICD-10-CM

## 2024-06-18 LAB — CBC WITH DIFFERENTIAL (CANCER CENTER ONLY)
Abs Immature Granulocytes: 0.02 10*3/uL (ref 0.00–0.07)
Basophils Absolute: 0 10*3/uL (ref 0.0–0.1)
Basophils Relative: 1 %
Eosinophils Absolute: 0.1 10*3/uL (ref 0.0–0.5)
Eosinophils Relative: 2 %
HCT: 41.1 % (ref 36.0–46.0)
Hemoglobin: 13.6 g/dL (ref 12.0–15.0)
Immature Granulocytes: 0 %
Lymphocytes Relative: 8 %
Lymphs Abs: 0.5 10*3/uL — ABNORMAL LOW (ref 0.7–4.0)
MCH: 32.9 pg (ref 26.0–34.0)
MCHC: 33.1 g/dL (ref 30.0–36.0)
MCV: 99.3 fL (ref 80.0–100.0)
Monocytes Absolute: 0.5 10*3/uL (ref 0.1–1.0)
Monocytes Relative: 8 %
Neutro Abs: 4.9 10*3/uL (ref 1.7–7.7)
Neutrophils Relative %: 81 %
Platelet Count: 157 10*3/uL (ref 150–400)
RBC: 4.14 MIL/uL (ref 3.87–5.11)
RDW: 14.4 % (ref 11.5–15.5)
WBC Count: 6.1 10*3/uL (ref 4.0–10.5)
nRBC: 0 % (ref 0.0–0.2)

## 2024-06-18 LAB — CMP (CANCER CENTER ONLY)
ALT: 10 U/L (ref 0–44)
AST: 19 U/L (ref 15–41)
Albumin: 3.6 g/dL (ref 3.5–5.0)
Alkaline Phosphatase: 67 U/L (ref 38–126)
Anion gap: 9 (ref 5–15)
BUN: 19 mg/dL (ref 6–20)
CO2: 22 mmol/L (ref 22–32)
Calcium: 8.8 mg/dL — ABNORMAL LOW (ref 8.9–10.3)
Chloride: 106 mmol/L (ref 98–111)
Creatinine: 0.91 mg/dL (ref 0.44–1.00)
GFR, Estimated: >60 mL/min (ref >60)
Glucose, Bld: 138 mg/dL — ABNORMAL HIGH (ref 70–99)
Potassium: 3.7 mmol/L (ref 3.5–5.1)
Sodium: 137 mmol/L (ref 135–145)
Total Bilirubin: 0.6 mg/dL (ref 0.0–1.2)
Total Protein: 6.6 g/dL (ref 6.5–8.1)

## 2024-06-18 LAB — GENETIC SCREENING ORDER

## 2024-06-18 LAB — TOTAL PROTEIN, URINE DIPSTICK: Protein, ur: NEGATIVE mg/dL

## 2024-06-18 MED ORDER — SODIUM CHLORIDE 0.9% FLUSH
10.0000 mL | Freq: Once | INTRAVENOUS | Status: AC
  Administered 2024-06-18: 10 mL via INTRAVENOUS
  Filled 2024-06-18: qty 10

## 2024-06-18 MED ORDER — ALTEPLASE 2 MG IJ SOLR
2.0000 mg | Freq: Once | INTRAMUSCULAR | Status: AC | PRN
  Administered 2024-06-18: 2 mg
  Filled 2024-06-18: qty 2

## 2024-06-18 MED ORDER — PALONOSETRON HCL INJECTION 0.25 MG/5ML: 0.2500 mg | Freq: Once | INTRAVENOUS | Status: DC

## 2024-06-18 MED ORDER — SODIUM CHLORIDE 0.9 % IV SOLN
5.0000 mg/kg | Freq: Once | INTRAVENOUS | Status: AC
  Administered 2024-06-18: 300 mg via INTRAVENOUS
  Filled 2024-06-18: qty 12

## 2024-06-18 MED ORDER — DEXAMETHASONE SODIUM PHOSPHATE 10 MG/ML IJ SOLN
10.0000 mg | Freq: Once | INTRAMUSCULAR | Status: AC
  Administered 2024-06-18: 10 mg via INTRAVENOUS
  Filled 2024-06-18: qty 1

## 2024-06-18 MED ORDER — SODIUM CHLORIDE 0.9 % IV SOLN: 400.0000 mg/m2 | Freq: Once | INTRAVENOUS | Status: DC

## 2024-06-18 MED ORDER — HEPARIN SOD (PORK) LOCK FLUSH 100 UNIT/ML IV SOLN
500.0000 [IU] | Freq: Once | INTRAVENOUS | Status: DC | PRN
Start: 2024-06-18 — End: 2024-06-18
  Filled 2024-06-18: qty 5

## 2024-06-18 MED ORDER — FLUOROURACIL CHEMO INJECTION 2.5 GM/50ML
400.0000 mg/m2 | Freq: Once | INTRAVENOUS | Status: AC
  Administered 2024-06-18: 650 mg via INTRAVENOUS
  Filled 2024-06-18: qty 13

## 2024-06-18 MED ORDER — SODIUM CHLORIDE 0.9 % IV SOLN
400.0000 mg/m2 | Freq: Once | INTRAVENOUS | Status: AC
  Administered 2024-06-18: 644 mg via INTRAVENOUS
  Filled 2024-06-18: qty 32.2

## 2024-06-18 MED ORDER — SODIUM CHLORIDE 0.9 % IV SOLN
2400.0000 mg/m2 | INTRAVENOUS | Status: DC
  Administered 2024-06-18: 3850 mg via INTRAVENOUS
  Filled 2024-06-18: qty 77

## 2024-06-18 MED ORDER — SODIUM CHLORIDE 0.9 % IV SOLN
Freq: Once | INTRAVENOUS | Status: AC
  Filled 2024-06-18: qty 250

## 2024-06-18 NOTE — Assessment & Plan Note (Addendum)
 Stable K level. Continue oral KCL 20meq daily.

## 2024-06-18 NOTE — Progress Notes (Signed)
 Hematology/Oncology Progress note [phone] [phone]      CHIEF COMPLAINTS/REASON FOR VISIT:  Follow-up for Stage IV  rectal cancer treatments.  ASSESSMENT & PLAN:   Cancer Staging  Rectal cancer [facility]) Staging form: Colon and Rectum, AJCC 8th Edition - Pathologic stage from 05/03/2022: Stage IIIB (pT3, pN1b, cM0) - Signed by Babara Call, MD on 05/03/2022 - Pathologic stage from 06/18/2023: Stage IVA (rpTX, rpN0, rpM1a) - Signed by Babara Call, MD on 06/18/2023   Rectal cancer [facility]) History of Stage III. pT3 pN1b cM0, s/p APR. 05/2023 Stage IV current rectal adenocarcinoma Currently on adjuvant chemotherapy. S/p FOLFOX x 4 cycles S/p concurrent chemotherapy [Xeloda  1300 mg BID] with RT, and then another 4 cycles of adjuvant FOLFOX [dose reduced oxaliplatin  and omit 5-FU bolus 05/2023 CT imaging indicates disease progression. liver biopsy pathology showed metastatic carcinoma--> 06/2023 FOLFIRI + Bevacizumab --> 10/2023 and 02/2024 CT partial response --> 05/2024 CT stable NGS showed TP53 missense variant, APC frameshift, TCF7L2 Frameshift, FLT3 copy number gain., TMB 4.7, pMMR Wildtype KRAS/NRAS  Labs are reviewed and discussed with patient. Shared decision was made to hold off irinotecan  given stable disease. .  Check Signatera circulating tumor DNA Proceed with  5-Fu + Bevacizumab      Weight loss Nutritionist follow up, stable weight.     Hypokalemia Stable K level. Continue oral KCL 20meq daily.  Encounter for antineoplastic chemotherapy Chemotherapy plan as listed above  Chemotherapy-induced neuropathy (HCC) Grade 2  Continue gabapentin  300mg  BID.   Chemotherapy induced diarrhea Recommend Imodium  PRN as directed.   Port-A-Cath in place Malfunction of medi port, she will get cath flow incubation.      No orders of the defined types were placed in this encounter.   Follow-up 2 week(s)  All questions were answered. The patient knows to call  the clinic with any problems, questions or concerns.  Call Babara, MD, PhD [facility] Hematology Oncology 06/18/2024     HISTORY OF PRESENTING ILLNESS:   April Luna is a  53 y.o.  female presents for treatment of rectal cancer Oncology History  Rectal cancer (HCC)  02/26/2022 Imaging   MRI PELVIS WITHOUT CONTRAST- By imaging, rectal cancer stage:  T1/T2, N0, Mx    03/02/2022 Imaging   CT CHEST AND ABDOMEN WITH CONTRAST 1. No convincing evidence of metastatic disease within the chest or abdomen. 2. Atrophic left kidney with multifocal renal scarring and cortical calcifications as well as nonobstructive renal stones measuring up to 5 mm. 3. Prominent left-sided predominant retroperitoneal lymph nodes measuring up to 8 mm near the level of the renal hilum, overall decreased in size dating back to CT September 19, 2018 and favored reactive related to left renal inflammation. 4.  Aortic Atherosclerosis (ICD10-I70.0).   03/27/2022 Genetic Testing    [facility] Custom[employer]+RNA cancer panel found no pathogenic mutations.    04/21/2022 Initial Diagnosis   Rectal cancer - baseline CEA 3.6 -02/09/2022, patient had colonoscopy which showed renal mass 10 cm from anal verge.  5 mm polyp in ascending colon.  Removed and retrieved. Pathology showed rectal adenocarcinoma.  The polyp in the ascending colon is a tubular adenoma.  MRI showed cT1/T2N0 disease  -04/21/2022, patient underwent robotic assisted ultralow anterior resection. Pathology showed moderately differentiated adenocarcinoma, 4.5 cm in maximal extent, with focal extension through muscularis propria into perirectal soft tissue.  3 lymph nodes positive for metastatic carcinoma.  Negative margin.  pT3 pN1b, MSI stable.   05/03/2022 Cancer Staging   Staging form: Colon and  Rectum, AJCC 8th Edition - Pathologic stage from 05/03/2022: Stage IIIB (pT3, pN1b, cM0) - Signed by Babara Call, MD on 05/03/2022 Stage prefix: Initial diagnosis   05/11/2022  Miscellaneous   Medi port placed by Dr.White   05/26/2022 -  Chemotherapy   FOLFOX Q2 weeks x 4   05/26/2022 - 07/09/2022 Chemotherapy   Patient is on Treatment Plan : COLORECTAL FOLFOX q14d x 8 cycles     05/26/2022 - 12/13/2022 Chemotherapy   Patient is on Treatment Plan : COLORECTAL FOLFOX q14d x 4 months     07/27/2022 -  Chemotherapy   Concurrent chemotherapy- Xeloda   1300mg  BID and radiation.    07/27/2022 - 09/12/2022 Radiation Therapy    concurrent chemotherapy [Xeloda  1300 mg BID] with RT    09/20/2022 Imaging   CT Angiogram chest PET protocol There is no evidence of pulmonary artery embolism. There is no evidence of thoracic aortic dissection.   Small linear patchy alveolar infiltrate is seen in medial segment of right middle lobe suggesting atelectasis/pneumonia.     09/28/2022 - 09/30/2022 Hospital Admission   Admission due to acute respiratory failure with hypoxia, COPD exacerbation   09/28/2022 Imaging   CT angio chest PE protocol 1. Negative for acute PE or thoracic aortic dissection. 2. New ground-glass infiltrates anteriorly in bilateral upper lobes,may represent atypical edema, infectious or inflammatory process. 3.  Aortic Atherosclerosis     12/22/2022 Imaging   CT chest abdomen pelvis w contrast  Stable exam. No evidence of recurrent or metastatic carcinoma within the chest, abdomen, or pelvis.   Left nephrolithiasis and renal parenchymal atrophy. No evidence of ureteral calculi or hydronephrosis.   Aortic Atherosclerosis   05/31/2023 Imaging   CT chest abdomen pelvis with contrast showed 1. New hypodense 16 mm lesion in the right lobe of the liver with very subtle adjacent 5 mm lesion, suspicious for metastatic disease. 2. Small bilateral pulmonary nodules, some of which were subtly evident on prior examination only in retrospect, some of which demonstrate degree of cavitation, suspicious for metastatic disease. 3. Prominent retroperitoneal lymph nodes are similar  prior. 4. Prior low anterior resection with Hartmann's pouch formation,similar small amount of perirectal/presacral soft tissue and fluid,favored postsurgical/posttreatment change. Suggest continued attention on follow-up imaging. 5. Large volume of formed stool in the colon. 6. Nonobstructive left renal calculi measure under 1 cm.     06/08/2023 Relapse/Recurrence   Ultrasound-guided liver biopsy showed Pathology showed metastatic carcinoma, morphology consistent with patient's clinical history of rectal adenocarcinoma.  [employer] NGS 648 gene panel showed TP53 missense variant, APC frameshift, TCF7L2 frameshift, FLT3 copy number gain, TMB 4.78m/MB MSI stable.  Wildtype KRAS/NRAS  [employer] RNA Seq - ERBB3 overexpressed, VEGFA overexpressed, NRAS overexpressed.     06/18/2023 Cancer Staging   Staging form: Colon and Rectum, AJCC 8th Edition - Pathologic stage from 06/18/2023: Stage IVA (rpTX, pN0, pM1a) - Signed by Babara Call, MD on 06/18/2023 Stage prefix: Recurrence Total positive nodes: 0   07/02/2023 - 06/04/2024 Chemotherapy   COLORECTAL FOLFIRI + Bevacizumab  q14d      11/13/2023 Imaging   CT chest abdomen pelvis w contrast showed  1. Increase in cavitation with decreased soft tissue involving the scattered bilateral pulmonary nodules, consistent with treatment response. No new suspicious pulmonary nodules or masses. 2. Decrease in size and conspicuity of the hypodense lesions in the right lobe of the liver, consistent with treatment response. No new suspicious hepatic lesion identified. 3. Stable prominent nonspecific retroperitoneal lymph nodes. Suggest continued attention on follow-up  imaging. 4. Similar surgical changes of prior low anterior resection with left anterior abdominal wall colostomy. Similar mesorectal/presacral soft tissue, no new suspicious enhancing nodularity. 5. Similar soft tissue nodularity in the bilateral posterior gluteal subcutaneous soft tissues. 6.  Moderate volume of formed stool in the colon. Correlate for constipation.   03/03/2024 Imaging   CT chest abdomen pelvis w contrast showed 1. 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 2. No new or progressive disease. 3. Similar prominent but not pathologically sized abdominal retroperitoneal nodes, nonspecific. 4. Status post low anterior resection with descending colostomy. Possible constipation. 5. Age advanced coronary artery atherosclerosis. Recommend assessment of coronary risk factors. 6.  Aortic Atherosclerosis (ICD10-I70.0). 7. Left nephrolithiasis and renal scarring.    05/27/2024 Imaging   CT chest abdomen pelvis w contrast showed 1. Status post abdominoperineal resection with left lower quadrant end colostomy. 2. Unchanged enlarged retroperitoneal lymph nodes. 3. Unchanged small hypodense lesion of the posterior right lobe of the liver. 4. Very tiny, irregular cavitary nodules are unchanged. 5. New areas of irregular atelectasis or consolidation of the peripheral right lower lobe, nonspecific and most likely infectious or inflammatory. Metastatic disease distinctly not favored. Attention on follow-up. 6. Atrophic, scarred left kidney with nonobstructive calculi of the inferior pole. No hydronephrosis.   Aortic Atherosclerosis (ICD10-I70.0).     06/18/2024 -  Chemotherapy   5-FU and Bevacizumab  maintenance.    Patient has bipolar and schizophrenia..  Patient is married  She has housing issue due to previous history of eviction.  she does not live with her husband currently. She lives with some family members.   + chronic dysuria and urgency she was seen by Urology and was started on Gemteza.  Symptoms are improved.    INTERVAL HISTORY JISELLE SHEU is a 53 y.o. female who has above history reviewed by me today presents for follow up visit for rectal cancer.   Denies any melena or blood in the stool. Stable neuropathy symptoms. + loose  BM, stable baseline. She feels extremely fatigued for 3 days after treatments +good appetite, weight is stable.    Review of Systems  Constitutional:  Positive for fatigue. Negative for appetite change, chills and fever.  HENT:   Negative for hearing loss, mouth sores and voice change.   Eyes:  Negative for eye problems.  Respiratory:  Negative for chest tightness, cough and shortness of breath.   Cardiovascular:  Negative for chest pain.  Gastrointestinal:  Positive for diarrhea. Negative for abdominal distention, abdominal pain, blood in stool and nausea.       + colostomy   Endocrine: Negative for hot flashes.  Genitourinary:  Negative for difficulty urinating, dysuria and frequency.   Musculoskeletal:  Negative for arthralgias.  Skin:  Negative for itching and rash.  Neurological:  Positive for numbness. Negative for extremity weakness.  Hematological:  Negative for adenopathy.  Psychiatric/Behavioral:  Negative for confusion.     MEDICAL HISTORY:  Past Medical History:  Diagnosis Date   Allergy    pollen   Anxiety    Arthritis    right hip   Bipolar 1 disorder (HCC)    Cancer (HCC)    rectal   Chemotherapy-induced neuropathy (HCC) 10/24/2022   Chronic kidney disease    COPD (chronic obstructive pulmonary disease) (HCC)    Depression    Family history of adverse reaction to anesthesia    grand father had a stroke during anesthesia   Family history of breast cancer  Family history of colon cancer    Family history of uterine cancer    GERD (gastroesophageal reflux disease)    History of kidney stones    Hyperlipidemia    Hypertension    Hypothyroidism    Panic attack    Pneumonia    Psoriasis    Sleep apnea 08/11/2021   No CPAP   Type 2 diabetes mellitus with microalbuminuria, without long-term current use of insulin  (HCC) 06/24/2019    SURGICAL HISTORY: Past Surgical History:  Procedure Laterality Date   BREAST BIOPSY Left 12/14/2021   us  bx, venus  marker, path pending   CESAREAN SECTION     COLONOSCOPY WITH PROPOFOL  N/A 02/09/2022   Procedure: COLONOSCOPY WITH PROPOFOL ;  Surgeon: Unk Corinn Skiff, MD;  Location: [facility];  Service: Endoscopy;  Laterality: N/A;  sleep apnea   CYSTOSCOPY W/ RETROGRADES Left 11/08/2018   Procedure: CYSTOSCOPY WITH RETROGRADE PYELOGRAM;  Surgeon: Francisca Redell BROCKS, MD;  Location: [facility];  Service: Urology;  Laterality: Left;   CYSTOSCOPY/URETEROSCOPY/HOLMIUM LASER/STENT PLACEMENT Left 11/08/2018   Procedure: CYSTOSCOPY/URETEROSCOPY/HOLMIUM LASER/STENT PLACEMENT;  Surgeon: Francisca Redell BROCKS, MD;  Location: [facility];  Service: Urology;  Laterality: Left;   IR CV LINE INJECTION  06/25/2023   IR IMAGING GUIDED PORT INSERTION  05/11/2022   IR REMOVE CV FIBRIN SHEATH  06/27/2023   MOUTH SURGERY     wisdom teeth extraction   MOUTH SURGERY     teeth removal   POLYPECTOMY N/A 02/09/2022   Procedure: POLYPECTOMY;  Surgeon: Unk Corinn Skiff, MD;  Location: [facility];  Service: Endoscopy;  Laterality: N/A;   XI ROBOTIC ASSISTED LOWER ANTERIOR RESECTION N/A 04/21/2022   Procedure: XI ROBOTIC ASSISTED LOWER ANTERIOR RESECTION WITH COLOSTOMY, BILATERAL TAP BLOCK, ASSESSMENT OF TISSUE PERFUSSION WITH FIREFLY INJECTION;  Surgeon: Teresa Lonni HERO, MD;  Location: [facility];  Service: General;  Laterality: N/A;    SOCIAL HISTORY: Social History   Socioeconomic History   Marital status: Married    Spouse name: Morene Jama Pereyra   Number of children: 1   Years of education: 12   Highest education level: High school graduate  Occupational History   Occupation: unemployed    Comment: disabled  Tobacco Use   Smoking status: Some Days    Current packs/day: 0.25    Average packs/day: 0.2 packs/day for 39.1 years (9.8 ttl pk-yrs)    Types: Cigarettes    Start date: 05/13/1985    Passive exposure: Current   Smokeless tobacco: Never   Tobacco comments:    5-6 cigarettes weekly- 11/07/2022   Vaping Use   Vaping status: Former   Start date: 05/25/2018   Quit date: 09/24/2018  Substance and Sexual Activity   Alcohol use: Not Currently    Alcohol/week: 4.0 standard drinks of alcohol    Types: 4 Glasses of wine per week    Comment: quit ETOH in Feb. 2023   Drug use: Yes    Types: Marijuana    Comment: 2 days ago   Sexual activity: Not Currently    Birth control/protection: Post-menopausal  Other Topics Concern   Not on file  Social History Narrative   Lives with husband and son    Social Drivers of Health   Financial Resource Strain: High Risk (08/30/2023)   Overall Financial Resource Strain (CARDIA)    Difficulty of Paying Living Expenses: Hard  Food Insecurity: Food Insecurity Present (04/22/2024)   Hunger Vital Sign    Worried About Programme researcher, broadcasting/film/video in  the Last Year: Sometimes true    Ran Out of Food in the Last Year: Sometimes true  Transportation Needs: No Transportation Needs (08/30/2023)   PRAPARE - Administrator, Civil Service (Medical): No    Lack of Transportation (Non-Medical): No  Physical Activity: Inactive (08/30/2023)   Exercise Vital Sign    Days of Exercise per Week: 0 days    Minutes of Exercise per Session: 0 min  Stress: Stress Concern Present (08/30/2023)   [employer] of Occupational Health - Occupational Stress Questionnaire    Feeling of Stress : Very much  Social Connections: Moderately Integrated (08/30/2023)   Social Connection and Isolation Panel    Frequency of Communication with Friends and Family: Never    Frequency of Social Gatherings with Friends and Family: Once a week    Attends Religious Services: More than 4 times per year    Active Member of [employer] or Organizations: No    Attends Engineer, structural: 1 to 4 times per year    Marital Status: Married  Catering manager Violence: Not At Risk (08/30/2023)   Humiliation, Afraid, Rape, and Kick questionnaire    Fear of Current or Ex-Partner: No    Emotionally  Abused: No    Physically Abused: No    Sexually Abused: No    FAMILY HISTORY: Family History  Problem Relation Age of Onset   Depression Mother    Anxiety disorder Mother    Diabetes Mother    Hypertension Mother    Hyperlipidemia Mother    Cancer Mother    Uterine cancer Mother 62   Cervical cancer Mother 38   Colon cancer Father    Depression Brother    Anxiety disorder Brother    Cancer Maternal Aunt        unk types   Diabetes Mellitus II Maternal Grandmother    Hypercholesterolemia Maternal Grandmother    Breast cancer Maternal Grandmother    Cancer Paternal Grandmother    Diabetes Paternal Grandmother    Melanoma Paternal Grandmother    Stomach cancer Paternal Grandmother     ALLERGIES:  is allergic to metformin and related, nsaids, perphenazine, sulfa antibiotics, abilify [aripiprazole], and penicillins.  MEDICATIONS:  Current Outpatient Medications  Medication Sig Dispense Refill   acetaminophen  (TYLENOL ) 325 MG tablet Take 650 mg by mouth every 6 (six) hours as needed for headache (pain).     albuterol  (VENTOLIN  HFA) 108 (90 Base) MCG/ACT inhaler Inhale 2 puffs into the lungs every 6 (six) hours as needed for wheezing or shortness of breath. 1 each 5   amantadine  (SYMMETREL ) 100 MG capsule Take 100 mg by mouth 2 (two) times daily.     blood glucose meter kit and supplies Dispense based on patient and insurance preference. Use up to four times daily as directed. (FOR ICD-10 E10.9, E11.9). 1 each 0   Budeson-Glycopyrrol-Formoterol  (BREZTRI  AEROSPHERE) 160-9-4.8 MCG/ACT AERO Inhale 2 puffs into the lungs in the morning and at bedtime. 10.7 g 2   buPROPion  (WELLBUTRIN  XL) 300 MG 24 hr tablet Take 300 mg by mouth daily.     Cholecalciferol  (VITAMIN D -3) 125 MCG (5000 UT) TABS Take 5,000 Units by mouth daily. 30 tablet 1   clonazePAM (KLONOPIN) 0.5 MG tablet Take 1 mg by mouth 2 (two) times daily as needed.     dexamethasone  (DECADRON ) 4 MG tablet Take 2 tablets (8 mg  total) by mouth See admin instructions. Take 8mg  daily for 2 days after each chemotherapy. 30  tablet 1   diltiazem  (CARDIZEM  CD) 120 MG 24 hr capsule TAKE 1 CAPSULE BY MOUTH DAILY 90 capsule 1   docusate sodium  (COLACE) 100 MG capsule TAKE 1 CAPSULE BY MOUTH TWICE DAILY 60 capsule 2   FARXIGA  10 MG TABS tablet TAKE 1 TABLET BY MOUTH DAILY BEFORE BREAKFAST 90 tablet 0   gabapentin  (NEURONTIN ) 300 MG capsule Take 300 mg by mouth 2 (two) times daily.     icosapent  Ethyl (VASCEPA ) 1 g capsule Take 2 capsules (2 g total) by mouth in the morning and at bedtime. 120 capsule 2   levothyroxine  (SYNTHROID ) 75 MCG tablet TAKE 1 TABLET BY MOUTH DAILY BEFORE BREAKFAST 90 tablet 2   lidocaine -prilocaine  (EMLA ) cream Apply to affected area once 30 g 3   loperamide  (IMODIUM ) 2 MG capsule Take 1 capsule (2 mg total) by mouth See admin instructions. Initial: 4 mg,the 2 mg every 2 hours (4 mg every 4 hours at night)  maximum: 16 mg/day 120 capsule 2   loratadine  (CLARITIN ) 10 MG tablet Take 10 mg by mouth every morning.     magic mouthwash w/lidocaine  SOLN Take 5 mLs by mouth 4 (four) times daily as needed for mouth pain. Sig: Swish/Swallow 5-10 ml four times a day as needed. Dispense 480 ml. 1RF 480 mL 1   ondansetron  (ZOFRAN ) 8 MG tablet Take 1 tablet (8 mg total) by mouth every 8 (eight) hours as needed for nausea, vomiting or refractory nausea / vomiting. Start on the third day after chemotherapy. 30 tablet 1   paliperidone  (INVEGA  SUSTENNA) 234 MG/1.5ML SUSY injection Inject 234 mg into the muscle every 30 (thirty) days. On or about the 14th of each month     pantoprazole  (PROTONIX ) 40 MG tablet TAKE 1 TABLET BY MOUTH DAILY 90 tablet 2   pioglitazone  (ACTOS ) 15 MG tablet Take 1 tablet (15 mg total) by mouth daily. 90 tablet 1   potassium chloride  SA (KLOR-CON  M) 20 MEQ tablet Take 1 tablet (20 mEq total) by mouth daily. 30 tablet 0   promethazine  (PHENERGAN ) 12.5 MG tablet Take 1 tablet (12.5 mg total) by mouth  every 6 (six) hours as needed for nausea or vomiting. 30 tablet 0   rosuvastatin  (CRESTOR ) 40 MG tablet TAKE 1 TABLET BY MOUTH EVERY MORNING 90 tablet 0   Spacer/Aero-Holding Chambers (AEROCHAMBER MV) inhaler Use as instructed 1 each 0   Vibegron  (GEMTESA ) 75 MG TABS Take 1 tablet (75 mg total) by mouth daily.     sertraline  (ZOLOFT ) 100 MG tablet Take 200 mg by mouth every morning. (Patient not taking: Reported on 06/18/2024)     No current facility-administered medications for this visit.   Facility-Administered Medications Ordered in Other Visits  Medication Dose Route Frequency Provider Last Rate Last Admin   0.9 %  sodium chloride  infusion   Intravenous Once Keyona Emrich, MD       albuterol  (PROVENTIL ) (2.5 MG/3ML) 0.083% nebulizer solution 2.5 mg  2.5 mg Nebulization Once Dgayli, Khabib, MD       bevacizumab -awwb (MVASI ) 300 mg in sodium chloride  0.9 % 100 mL chemo infusion  5 mg/kg (Treatment Plan Recorded) Intravenous Once Babara Call, MD       dexamethasone  (DECADRON ) injection 10 mg  10 mg Intravenous Once Kryssa Risenhoover, MD       fluorouracil  (ADRUCIL ) chemo injection 650 mg  400 mg/m2 (Treatment Plan Recorded) Intravenous Once Babara Call, MD       heparin  lock flush 100 unit/mL  500 Units Intracatheter  Once PRN Babara Call, MD       leucovorin  644 mg in sodium chloride  0.9 % 250 mL infusion  400 mg/m2 (Treatment Plan Recorded) Intravenous Once Babara Call, MD       palonosetron  (ALOXI ) injection 0.25 mg  0.25 mg Intravenous Once Babara Call, MD       sodium chloride  flush (NS) 0.9 % injection 10 mL  10 mL Intracatheter PRN Babara Call, MD   10 mL at 08/15/23 1327   sodium chloride  flush (NS) 0.9 % injection 10 mL  10 mL Intracatheter PRN Babara Call, MD   10 mL at 03/26/24 1255     PHYSICAL EXAMINATION: ECOG PERFORMANCE STATUS: 0 - Asymptomatic Vitals:   06/18/24 0913 06/18/24 0921  BP: (!) 133/103 119/86  Pulse: 81   Resp: 18   Temp: (!) 95.7 F (35.4 C)   SpO2: 98%     Filed Weights   06/18/24  0913  Weight: 122 lb (55.3 kg)     Physical Exam Constitutional:      General: She is not in acute distress. HENT:     Head: Normocephalic and atraumatic.   Eyes:     General: No scleral icterus.   Cardiovascular:     Rate and Rhythm: Normal rate and regular rhythm.  Pulmonary:     Effort: Pulmonary effort is normal. No respiratory distress.     Breath sounds: No wheezing.     Comments: Decreased breath sound bilaterally Abdominal:     General: Bowel sounds are normal. There is no distension.     Palpations: Abdomen is soft.     Comments: +colostomy    Musculoskeletal:        General: No deformity. Normal range of motion.     Cervical back: Normal range of motion and neck supple.   Skin:    General: Skin is warm and dry.     Findings: No erythema or rash.   Neurological:     Mental Status: She is alert and oriented to person, place, and time. Mental status is at baseline.     Cranial Nerves: No cranial nerve deficit.     Coordination: Coordination normal.   Psychiatric:        Mood and Affect: Mood normal.     LABORATORY DATA:  I have reviewed the data as listed    Latest Ref Rng & Units 06/18/2024    8:31 AM 06/03/2024    8:20 AM 05/20/2024    8:14 AM  CBC  WBC 4.0 - 10.5 K/uL 6.1  7.9  2.8   Hemoglobin 12.0 - 15.0 g/dL 86.3  85.6  86.7   Hematocrit 36.0 - 46.0 % 41.1  43.9  39.4   Platelets 150 - 400 K/uL 157  147  174       Latest Ref Rng & Units 06/18/2024    8:31 AM 06/03/2024    8:20 AM 05/20/2024    8:14 AM  CMP  Glucose 70 - 99 mg/dL 861  860  851   BUN 6 - 20 mg/dL 19  14  10    Creatinine 0.44 - 1.00 mg/dL 9.08  9.00  9.05   Sodium 135 - 145 mmol/L 137  140  140   Potassium 3.5 - 5.1 mmol/L 3.7  3.7  3.4   Chloride 98 - 111 mmol/L 106  105  109   CO2 22 - 32 mmol/L 22  25  22    Calcium  8.9 - 10.3 mg/dL 8.8  8.7  8.3   Total Protein 6.5 - 8.1 g/dL 6.6  6.9  5.8   Total Bilirubin 0.0 - 1.2 mg/dL 0.6  0.4  0.5   Alkaline Phos 38 - 126 U/L 67   77  67   AST 15 - 41 U/L 19  17  20    ALT 0 - 44 U/L 10  12  13          RADIOGRAPHIC STUDIES: I have personally reviewed the radiological images as listed and agreed with the findings in the report. CT CHEST ABDOMEN PELVIS W CONTRAST Result Date: 05/27/2024 CLINICAL DATA:  Metastatic rectal cancer restaging * Tracking Code: BO * EXAM: CT CHEST, ABDOMEN, AND PELVIS WITH CONTRAST TECHNIQUE: Multidetector CT imaging of the chest, abdomen and pelvis was performed following the standard protocol during bolus administration of intravenous contrast. RADIATION DOSE REDUCTION: This exam was performed according to the departmental dose-optimization program which includes automated exposure control, adjustment of the mA and/or kV according to patient size and/or use of iterative reconstruction technique. CONTRAST:  100mL OMNIPAQUE  IOHEXOL  300 MG/ML SOLN additional oral enteric contrast COMPARISON:  02/26/2024 FINDINGS: CT CHEST FINDINGS Cardiovascular: No significant vascular findings. Normal heart size. No pericardial effusion. Mediastinum/Nodes: No enlarged mediastinal, hilar, or axillary lymph nodes. Thyroid  gland, trachea, and esophagus demonstrate no significant findings. Lungs/Pleura: Very tiny, irregular cavitary nodules are unchanged, for example in the medial posterior left lower lobe measuring 0.5 cm (series 4, image 84) and in the medial segment right middle lobe measuring 0.4 cm (series 4, image 86). New areas of irregular atelectasis or consolidation of the peripheral right lower lobe (series 4, image 103). No pleural effusion or pneumothorax. Musculoskeletal: No chest wall abnormality. No acute osseous findings. CT ABDOMEN PELVIS FINDINGS Hepatobiliary: Unchanged small hypodense lesion of the posterior right lobe of the liver, hepatic segment VII, measuring 0.8 cm (series 2, image 49). Contracted gallbladder. No gallstones, gallbladder wall thickening, or biliary dilatation. Pancreas: Unremarkable. No  pancreatic ductal dilatation or surrounding inflammatory changes. Spleen: Normal in size without significant abnormality. Adrenals/Urinary Tract: Adrenal glands are unremarkable. Atrophic, scarred left kidney with nonobstructive calculi of the inferior pole. No hydronephrosis. Simple, benign bilateral renal cortical cysts, requiring no further follow-up or characterization. Bladder is unremarkable. Stomach/Bowel: Stomach is within normal limits. Appendix appears normal. No evidence of bowel wall thickening, distention, or inflammatory changes. Status post abdominoperineal resection with left lower quadrant end colostomy. Vascular/Lymphatic: Aortic atherosclerosis. Unchanged enlarged retroperitoneal lymph nodes, index left retroperitoneal node measuring 1.4 x 1.0 cm (series 2, image 64). Reproductive: No mass or other abnormality. Other: No abdominal wall hernia. Left lower quadrant end colostomy. Injection granulomata of the buttocks. No ascites. Musculoskeletal: No acute osseous findings. IMPRESSION: 1. Status post abdominoperineal resection with left lower quadrant end colostomy. 2. Unchanged enlarged retroperitoneal lymph nodes. 3. Unchanged small hypodense lesion of the posterior right lobe of the liver. 4. Very tiny, irregular cavitary nodules are unchanged. 5. New areas of irregular atelectasis or consolidation of the peripheral right lower lobe, nonspecific and most likely infectious or inflammatory. Metastatic disease distinctly not favored. Attention on follow-up. 6. Atrophic, scarred left kidney with nonobstructive calculi of the inferior pole. No hydronephrosis. Aortic Atherosclerosis (ICD10-I70.0). Electronically Signed   By: Marolyn JONETTA Jaksch M.D.   On: 05/27/2024 11:25

## 2024-06-18 NOTE — Assessment & Plan Note (Addendum)
 History of Stage III. pT3 pN1b cM0, s/p APR. 05/2023 Stage IV current rectal adenocarcinoma Currently on adjuvant chemotherapy. S/p FOLFOX x 4 cycles S/p concurrent chemotherapy [Xeloda  1300 mg BID] with RT, and then another 4 cycles of adjuvant FOLFOX [dose reduced oxaliplatin  and omit 5-FU bolus 05/2023 CT imaging indicates disease progression. liver biopsy pathology showed metastatic carcinoma--> 06/2023 FOLFIRI + Bevacizumab --> 10/2023 and 02/2024 CT partial response --> 05/2024 CT stable NGS showed TP53 missense variant, APC frameshift, TCF7L2 Frameshift, FLT3 copy number gain., TMB 4.7, pMMR Wildtype KRAS/NRAS  Labs are reviewed and discussed with patient. Shared decision was made to hold off irinotecan  given stable disease. .  Check Signatera circulating tumor DNA Proceed with  5-Fu + Bevacizumab 

## 2024-06-18 NOTE — Assessment & Plan Note (Signed)
Recommend Imodium PRN as directed.

## 2024-06-18 NOTE — Assessment & Plan Note (Signed)
 Malfunction of medi port, she will get cath flow incubation.

## 2024-06-18 NOTE — Progress Notes (Signed)
 Unable to get blood return from port will have to draw blood peripheral, slight blood tinged, patient says she has had problems before. MD order to administer Cath-flow. Will continue to monitor

## 2024-06-18 NOTE — Assessment & Plan Note (Signed)
 Chemotherapy plan as listed above

## 2024-06-18 NOTE — Assessment & Plan Note (Signed)
 Grade 2  Continue gabapentin 300mg  BID.

## 2024-06-18 NOTE — Patient Instructions (Signed)
 [facility] - A DEPT OF [facility]  Discharge Instructions: Thank you for choosing [facility] to provide your oncology and hematology care.  If you have a lab appointment with the Cancer Center, please go directly to the Cancer Center and check in at the registration area.  Wear comfortable clothing and clothing appropriate for easy access to any Portacath or PICC line.   We strive to give you quality time with your provider. You may need to reschedule your appointment if you arrive late (15 or more minutes).  Arriving late affects you and other patients whose appointments are after yours.  Also, if you miss three or more appointments without notifying the office, you may be dismissed from the clinic at the provider's discretion.      For prescription refill requests, have your pharmacy contact our office and allow 72 hours for refills to be completed.    Today you received the following chemotherapy and/or immunotherapy agents MVASI , Leucovorin , Adrucil       To help prevent nausea and vomiting after your treatment, we encourage you to take your nausea medication as directed.  BELOW ARE SYMPTOMS THAT SHOULD BE REPORTED IMMEDIATELY: *FEVER GREATER THAN 100.4 F (38 C) OR HIGHER *CHILLS OR SWEATING *NAUSEA AND VOMITING THAT IS NOT CONTROLLED WITH YOUR NAUSEA MEDICATION *UNUSUAL SHORTNESS OF BREATH *UNUSUAL BRUISING OR BLEEDING *URINARY PROBLEMS (pain or burning when urinating, or frequent urination) *BOWEL PROBLEMS (unusual diarrhea, constipation, pain near the anus) TENDERNESS IN MOUTH AND THROAT WITH OR WITHOUT PRESENCE OF ULCERS (sore throat, sores in mouth, or a toothache) UNUSUAL RASH, SWELLING OR PAIN  UNUSUAL VAGINAL DISCHARGE OR ITCHING   Items with * indicate a potential emergency and should be followed up as soon as possible or go to the Emergency Department if any problems should occur.  Please show the CHEMOTHERAPY ALERT CARD or  IMMUNOTHERAPY ALERT CARD at check-in to the Emergency Department and triage nurse.  Should you have questions after your visit or need to cancel or reschedule your appointment, please contact [facility] - A DEPT OF [facility]  [phone] and follow the prompts.  Office hours are 8:00 a.m. to 4:30 p.m. Monday - Friday. Please note that voicemails left after 4:00 p.m. may not be returned until the following business day.  We are closed weekends and major holidays. You have access to a nurse at all times for urgent questions. Please call the main number to the clinic [phone] and follow the prompts.  For any non-urgent questions, you may also contact your provider using MyChart. We now offer e-Visits for anyone 66 and older to request care online for non-urgent symptoms. For details visit [email or web address].   Also download the MyChart app! Go to the app store, search MyChart, open the app, select [facility], and log in with your MyChart username and password.

## 2024-06-18 NOTE — Assessment & Plan Note (Signed)
 Nutritionist follow up, stable weight.

## 2024-06-19 LAB — CEA: CEA: 3.3 ng/mL (ref 0.0–4.7)

## 2024-06-20 ENCOUNTER — Inpatient Hospital Stay

## 2024-06-20 DIAGNOSIS — C20 Malignant neoplasm of rectum: Secondary | ICD-10-CM

## 2024-06-20 MED ORDER — HEPARIN SOD (PORK) LOCK FLUSH 100 UNIT/ML IV SOLN
500.0000 [IU] | Freq: Once | INTRAVENOUS | Status: AC | PRN
  Administered 2024-06-20: 500 [IU]
  Filled 2024-06-20: qty 5

## 2024-06-25 ENCOUNTER — Encounter: Payer: Self-pay | Admitting: Oncology

## 2024-06-25 ENCOUNTER — Encounter: Payer: Self-pay | Admitting: Nurse Practitioner

## 2024-06-26 ENCOUNTER — Encounter: Payer: Self-pay | Admitting: Oncology

## 2024-07-02 ENCOUNTER — Inpatient Hospital Stay

## 2024-07-02 ENCOUNTER — Inpatient Hospital Stay (HOSPITAL_BASED_OUTPATIENT_CLINIC_OR_DEPARTMENT_OTHER): Admitting: Oncology

## 2024-07-02 ENCOUNTER — Inpatient Hospital Stay: Attending: Oncology

## 2024-07-02 ENCOUNTER — Encounter: Payer: Self-pay | Admitting: Oncology

## 2024-07-02 VITALS — BP 106/83 | HR 97 | Temp 97.8°F | Resp 18 | Wt 124.0 lb

## 2024-07-02 DIAGNOSIS — F209 Schizophrenia, unspecified: Secondary | ICD-10-CM | POA: Insufficient documentation

## 2024-07-02 DIAGNOSIS — E876 Hypokalemia: Secondary | ICD-10-CM

## 2024-07-02 DIAGNOSIS — I129 Hypertensive chronic kidney disease with stage 1 through stage 4 chronic kidney disease, or unspecified chronic kidney disease: Secondary | ICD-10-CM | POA: Insufficient documentation

## 2024-07-02 DIAGNOSIS — T451X5A Adverse effect of antineoplastic and immunosuppressive drugs, initial encounter: Secondary | ICD-10-CM | POA: Insufficient documentation

## 2024-07-02 DIAGNOSIS — I2510 Atherosclerotic heart disease of native coronary artery without angina pectoris: Secondary | ICD-10-CM | POA: Diagnosis not present

## 2024-07-02 DIAGNOSIS — I700 Atherosclerosis of aorta: Secondary | ICD-10-CM | POA: Diagnosis not present

## 2024-07-02 DIAGNOSIS — K521 Toxic gastroenteritis and colitis: Secondary | ICD-10-CM

## 2024-07-02 DIAGNOSIS — G620 Drug-induced polyneuropathy: Secondary | ICD-10-CM | POA: Diagnosis not present

## 2024-07-02 DIAGNOSIS — E1122 Type 2 diabetes mellitus with diabetic chronic kidney disease: Secondary | ICD-10-CM | POA: Insufficient documentation

## 2024-07-02 DIAGNOSIS — Z79890 Hormone replacement therapy (postmenopausal): Secondary | ICD-10-CM | POA: Insufficient documentation

## 2024-07-02 DIAGNOSIS — Z5111 Encounter for antineoplastic chemotherapy: Secondary | ICD-10-CM | POA: Diagnosis not present

## 2024-07-02 DIAGNOSIS — E1140 Type 2 diabetes mellitus with diabetic neuropathy, unspecified: Secondary | ICD-10-CM | POA: Insufficient documentation

## 2024-07-02 DIAGNOSIS — N200 Calculus of kidney: Secondary | ICD-10-CM | POA: Insufficient documentation

## 2024-07-02 DIAGNOSIS — E785 Hyperlipidemia, unspecified: Secondary | ICD-10-CM | POA: Diagnosis not present

## 2024-07-02 DIAGNOSIS — C7800 Secondary malignant neoplasm of unspecified lung: Secondary | ICD-10-CM | POA: Insufficient documentation

## 2024-07-02 DIAGNOSIS — G4730 Sleep apnea, unspecified: Secondary | ICD-10-CM | POA: Insufficient documentation

## 2024-07-02 DIAGNOSIS — C20 Malignant neoplasm of rectum: Secondary | ICD-10-CM | POA: Diagnosis present

## 2024-07-02 DIAGNOSIS — R197 Diarrhea, unspecified: Secondary | ICD-10-CM | POA: Diagnosis not present

## 2024-07-02 DIAGNOSIS — J449 Chronic obstructive pulmonary disease, unspecified: Secondary | ICD-10-CM | POA: Insufficient documentation

## 2024-07-02 DIAGNOSIS — F17210 Nicotine dependence, cigarettes, uncomplicated: Secondary | ICD-10-CM | POA: Insufficient documentation

## 2024-07-02 DIAGNOSIS — E039 Hypothyroidism, unspecified: Secondary | ICD-10-CM | POA: Insufficient documentation

## 2024-07-02 DIAGNOSIS — Z7984 Long term (current) use of oral hypoglycemic drugs: Secondary | ICD-10-CM | POA: Insufficient documentation

## 2024-07-02 DIAGNOSIS — K219 Gastro-esophageal reflux disease without esophagitis: Secondary | ICD-10-CM | POA: Insufficient documentation

## 2024-07-02 DIAGNOSIS — N189 Chronic kidney disease, unspecified: Secondary | ICD-10-CM | POA: Insufficient documentation

## 2024-07-02 DIAGNOSIS — Z5189 Encounter for other specified aftercare: Secondary | ICD-10-CM | POA: Diagnosis not present

## 2024-07-02 DIAGNOSIS — F319 Bipolar disorder, unspecified: Secondary | ICD-10-CM | POA: Diagnosis not present

## 2024-07-02 DIAGNOSIS — D122 Benign neoplasm of ascending colon: Secondary | ICD-10-CM | POA: Diagnosis not present

## 2024-07-02 DIAGNOSIS — C787 Secondary malignant neoplasm of liver and intrahepatic bile duct: Secondary | ICD-10-CM | POA: Diagnosis not present

## 2024-07-02 DIAGNOSIS — R590 Localized enlarged lymph nodes: Secondary | ICD-10-CM | POA: Diagnosis not present

## 2024-07-02 DIAGNOSIS — R634 Abnormal weight loss: Secondary | ICD-10-CM

## 2024-07-02 DIAGNOSIS — Z800 Family history of malignant neoplasm of digestive organs: Secondary | ICD-10-CM | POA: Insufficient documentation

## 2024-07-02 DIAGNOSIS — Z803 Family history of malignant neoplasm of breast: Secondary | ICD-10-CM | POA: Insufficient documentation

## 2024-07-02 LAB — CBC WITH DIFFERENTIAL (CANCER CENTER ONLY)
Abs Immature Granulocytes: 0.01 K/uL (ref 0.00–0.07)
Basophils Absolute: 0 K/uL (ref 0.0–0.1)
Basophils Relative: 1 %
Eosinophils Absolute: 0.1 K/uL (ref 0.0–0.5)
Eosinophils Relative: 1 %
HCT: 39.6 % (ref 36.0–46.0)
Hemoglobin: 13.5 g/dL (ref 12.0–15.0)
Immature Granulocytes: 0 %
Lymphocytes Relative: 13 %
Lymphs Abs: 0.6 K/uL — ABNORMAL LOW (ref 0.7–4.0)
MCH: 33.8 pg (ref 26.0–34.0)
MCHC: 34.1 g/dL (ref 30.0–36.0)
MCV: 99 fL (ref 80.0–100.0)
Monocytes Absolute: 0.5 K/uL (ref 0.1–1.0)
Monocytes Relative: 9 %
Neutro Abs: 3.7 K/uL (ref 1.7–7.7)
Neutrophils Relative %: 76 %
Platelet Count: 135 K/uL — ABNORMAL LOW (ref 150–400)
RBC: 4 MIL/uL (ref 3.87–5.11)
RDW: 14.6 % (ref 11.5–15.5)
WBC Count: 4.9 K/uL (ref 4.0–10.5)
nRBC: 0 % (ref 0.0–0.2)

## 2024-07-02 LAB — CMP (CANCER CENTER ONLY)
ALT: 12 U/L (ref 0–44)
AST: 20 U/L (ref 15–41)
Albumin: 3.7 g/dL (ref 3.5–5.0)
Alkaline Phosphatase: 72 U/L (ref 38–126)
Anion gap: 8 (ref 5–15)
BUN: 16 mg/dL (ref 6–20)
CO2: 24 mmol/L (ref 22–32)
Calcium: 9 mg/dL (ref 8.9–10.3)
Chloride: 103 mmol/L (ref 98–111)
Creatinine: 0.97 mg/dL (ref 0.44–1.00)
GFR, Estimated: >60 mL/min (ref >60)
Glucose, Bld: 141 mg/dL — ABNORMAL HIGH (ref 70–99)
Potassium: 4.3 mmol/L (ref 3.5–5.1)
Sodium: 135 mmol/L (ref 135–145)
Total Bilirubin: 0.4 mg/dL (ref 0.0–1.2)
Total Protein: 6.7 g/dL (ref 6.5–8.1)

## 2024-07-02 LAB — TOTAL PROTEIN, URINE DIPSTICK: Protein, ur: NEGATIVE mg/dL

## 2024-07-02 MED ORDER — SODIUM CHLORIDE 0.9 % IV SOLN
Freq: Once | INTRAVENOUS | Status: AC
Start: 2024-07-02 — End: 2024-07-02
  Filled 2024-07-02: qty 250

## 2024-07-02 MED ORDER — SODIUM CHLORIDE 0.9 % IV SOLN
400.0000 mg/m2 | Freq: Once | INTRAVENOUS | Status: AC
  Administered 2024-07-02: 644 mg via INTRAVENOUS
  Filled 2024-07-02: qty 32.2

## 2024-07-02 MED ORDER — SODIUM CHLORIDE 0.9 % IV SOLN
2400.0000 mg/m2 | INTRAVENOUS | Status: DC
  Administered 2024-07-02: 3850 mg via INTRAVENOUS
  Filled 2024-07-02: qty 77

## 2024-07-02 MED ORDER — FLUOROURACIL CHEMO INJECTION 2.5 GM/50ML
400.0000 mg/m2 | Freq: Once | INTRAVENOUS | Status: AC
  Administered 2024-07-02: 650 mg via INTRAVENOUS
  Filled 2024-07-02: qty 13

## 2024-07-02 MED ORDER — DEXAMETHASONE SODIUM PHOSPHATE 10 MG/ML IJ SOLN
10.0000 mg | Freq: Once | INTRAMUSCULAR | Status: AC
  Administered 2024-07-02: 10 mg via INTRAVENOUS

## 2024-07-02 MED ORDER — SODIUM CHLORIDE 0.9 % IV SOLN
5.0000 mg/kg | Freq: Once | INTRAVENOUS | Status: AC
  Administered 2024-07-02: 300 mg via INTRAVENOUS
  Filled 2024-07-02: qty 12

## 2024-07-02 NOTE — Assessment & Plan Note (Signed)
 Grade 2  Continue gabapentin 300mg  BID.

## 2024-07-02 NOTE — Assessment & Plan Note (Signed)
Recommend Imodium PRN as directed.

## 2024-07-02 NOTE — Assessment & Plan Note (Signed)
 Nutritionist follow up, stable weight.

## 2024-07-02 NOTE — Progress Notes (Signed)
 Hematology/Oncology Progress note [phone] [phone]      CHIEF COMPLAINTS/REASON FOR VISIT:  Follow-up for Stage IV  rectal cancer treatments.  ASSESSMENT & PLAN:   Cancer Staging  Rectal cancer [facility]) Staging form: Colon and Rectum, AJCC 8th Edition - Pathologic stage from 05/03/2022: Stage IIIB (pT3, pN1b, cM0) - Signed by Babara Call, MD on 05/03/2022 - Pathologic stage from 06/18/2023: Stage IVA (rpTX, rpN0, rpM1a) - Signed by Babara Call, MD on 06/18/2023   Rectal cancer [facility]) History of Stage III. pT3 pN1b cM0, s/p APR. 05/2023 Stage IV current rectal adenocarcinoma Currently on adjuvant chemotherapy. S/p FOLFOX x 4 cycles S/p concurrent chemotherapy [Xeloda  1300 mg BID] with RT, and then another 4 cycles of adjuvant FOLFOX [dose reduced oxaliplatin  and omit 5-FU bolus 05/2023 CT imaging indicates disease progression. liver biopsy pathology showed metastatic carcinoma--> 06/2023 FOLFIRI + Bevacizumab --> 10/2023 and 02/2024 CT partial response --> 05/2024 CT stable NGS showed TP53 missense variant, APC frameshift, TCF7L2 Frameshift, FLT3 copy number gain., TMB 4.7, pMMR Wildtype KRAS/NRAS  Labs are reviewed and discussed with patient. Shared decision was made to hold off irinotecan  given stable disease. .  Check Signatera circulating tumor DNA Proceed with  5-Fu + Bevacizumab      Chemotherapy induced diarrhea Recommend Imodium  PRN as directed.   Chemotherapy-induced neuropathy (HCC) Grade 2  Continue gabapentin  300mg  BID.   Encounter for antineoplastic chemotherapy Chemotherapy plan as listed above  Hypokalemia Stable K level. Continue oral KCL 20meq daily.  Weight loss Nutritionist follow up, stable weight.    No orders of the defined types were placed in this encounter.   Follow-up 2 week(s)  All questions were answered. The patient knows to call the clinic with any problems, questions or concerns.  Call Babara, MD, PhD [facility] Hematology  Oncology 07/02/2024     HISTORY OF PRESENTING ILLNESS:   April Luna is a  53 y.o.  female presents for treatment of rectal cancer Oncology History  Rectal cancer (HCC)  02/26/2022 Imaging   MRI PELVIS WITHOUT CONTRAST- By imaging, rectal cancer stage:  T1/T2, N0, Mx    03/02/2022 Imaging   CT CHEST AND ABDOMEN WITH CONTRAST 1. No convincing evidence of metastatic disease within the chest or abdomen. 2. Atrophic left kidney with multifocal renal scarring and cortical calcifications as well as nonobstructive renal stones measuring up to 5 mm. 3. Prominent left-sided predominant retroperitoneal lymph nodes measuring up to 8 mm near the level of the renal hilum, overall decreased in size dating back to CT September 19, 2018 and favored reactive related to left renal inflammation. 4.  Aortic Atherosclerosis (ICD10-I70.0).   03/27/2022 Genetic Testing    [facility] CustomNext+RNA cancer panel found no pathogenic mutations.    04/21/2022 Initial Diagnosis   Rectal cancer - baseline CEA 3.6 -02/09/2022, patient had colonoscopy which showed renal mass 10 cm from anal verge.  5 mm polyp in ascending colon.  Removed and retrieved. Pathology showed rectal adenocarcinoma.  The polyp in the ascending colon is a tubular adenoma.  MRI showed cT1/T2N0 disease  -04/21/2022, patient underwent robotic assisted ultralow anterior resection. Pathology showed moderately differentiated adenocarcinoma, 4.5 cm in maximal extent, with focal extension through muscularis propria into perirectal soft tissue.  3 lymph nodes positive for metastatic carcinoma.  Negative margin.  pT3 pN1b, MSI stable.   05/03/2022 Cancer Staging   Staging form: Colon and Rectum, AJCC 8th Edition - Pathologic stage from 05/03/2022: Stage IIIB (pT3, pN1b, cM0) - Signed by Babara Call,  MD on 05/03/2022 Stage prefix: Initial diagnosis   05/11/2022 Miscellaneous   Medi port placed by Dr.White   05/26/2022 -  Chemotherapy   FOLFOX Q2 weeks x  4   05/26/2022 - 07/09/2022 Chemotherapy   Patient is on Treatment Plan : COLORECTAL FOLFOX q14d x 8 cycles     05/26/2022 - 12/13/2022 Chemotherapy   Patient is on Treatment Plan : COLORECTAL FOLFOX q14d x 4 months     07/27/2022 -  Chemotherapy   Concurrent chemotherapy- Xeloda   1300mg  BID and radiation.    07/27/2022 - 09/12/2022 Radiation Therapy    concurrent chemotherapy [Xeloda  1300 mg BID] with RT    09/20/2022 Imaging   CT Angiogram chest PET protocol There is no evidence of pulmonary artery embolism. There is no evidence of thoracic aortic dissection.   Small linear patchy alveolar infiltrate is seen in medial segment of right middle lobe suggesting atelectasis/pneumonia.     09/28/2022 - 09/30/2022 Hospital Admission   Admission due to acute respiratory failure with hypoxia, COPD exacerbation   09/28/2022 Imaging   CT angio chest PE protocol 1. Negative for acute PE or thoracic aortic dissection. 2. New ground-glass infiltrates anteriorly in bilateral upper lobes,may represent atypical edema, infectious or inflammatory process. 3.  Aortic Atherosclerosis     12/22/2022 Imaging   CT chest abdomen pelvis w contrast  Stable exam. No evidence of recurrent or metastatic carcinoma within the chest, abdomen, or pelvis.   Left nephrolithiasis and renal parenchymal atrophy. No evidence of ureteral calculi or hydronephrosis.   Aortic Atherosclerosis   05/31/2023 Imaging   CT chest abdomen pelvis with contrast showed 1. New hypodense 16 mm lesion in the right lobe of the liver with very subtle adjacent 5 mm lesion, suspicious for metastatic disease. 2. Small bilateral pulmonary nodules, some of which were subtly evident on prior examination only in retrospect, some of which demonstrate degree of cavitation, suspicious for metastatic disease. 3. Prominent retroperitoneal lymph nodes are similar prior. 4. Prior low anterior resection with Hartmann's pouch formation,similar small amount of  perirectal/presacral soft tissue and fluid,favored postsurgical/posttreatment change. Suggest continued attention on follow-up imaging. 5. Large volume of formed stool in the colon. 6. Nonobstructive left renal calculi measure under 1 cm.     06/08/2023 Relapse/Recurrence   Ultrasound-guided liver biopsy showed Pathology showed metastatic carcinoma, morphology consistent with patient's clinical history of rectal adenocarcinoma.  [employer] NGS 648 gene panel showed TP53 missense variant, APC frameshift, TCF7L2 frameshift, FLT3 copy number gain, TMB 4.68m/MB MSI stable.  Wildtype KRAS/NRAS  [employer] RNA Seq - ERBB3 overexpressed, VEGFA overexpressed, NRAS overexpressed.     06/18/2023 Cancer Staging   Staging form: Colon and Rectum, AJCC 8th Edition - Pathologic stage from 06/18/2023: Stage IVA (rpTX, pN0, pM1a) - Signed by Babara Call, MD on 06/18/2023 Stage prefix: Recurrence Total positive nodes: 0   07/02/2023 - 06/04/2024 Chemotherapy   COLORECTAL FOLFIRI + Bevacizumab  q14d      11/13/2023 Imaging   CT chest abdomen pelvis w contrast showed  1. Increase in cavitation with decreased soft tissue involving the scattered bilateral pulmonary nodules, consistent with treatment response. No new suspicious pulmonary nodules or masses. 2. Decrease in size and conspicuity of the hypodense lesions in the right lobe of the liver, consistent with treatment response. No new suspicious hepatic lesion identified. 3. Stable prominent nonspecific retroperitoneal lymph nodes. Suggest continued attention on follow-up imaging. 4. Similar surgical changes of prior low anterior resection with left anterior abdominal wall colostomy. Similar mesorectal/presacral soft  tissue, no new suspicious enhancing nodularity. 5. Similar soft tissue nodularity in the bilateral posterior gluteal subcutaneous soft tissues. 6. Moderate volume of formed stool in the colon. Correlate for constipation.   03/03/2024 Imaging   CT  chest abdomen pelvis w contrast showed 1. 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 2. No new or progressive disease. 3. Similar prominent but not pathologically sized abdominal retroperitoneal nodes, nonspecific. 4. Status post low anterior resection with descending colostomy. Possible constipation. 5. Age advanced coronary artery atherosclerosis. Recommend assessment of coronary risk factors. 6.  Aortic Atherosclerosis (ICD10-I70.0). 7. Left nephrolithiasis and renal scarring.    05/27/2024 Imaging   CT chest abdomen pelvis w contrast showed 1. Status post abdominoperineal resection with left lower quadrant end colostomy. 2. Unchanged enlarged retroperitoneal lymph nodes. 3. Unchanged small hypodense lesion of the posterior right lobe of the liver. 4. Very tiny, irregular cavitary nodules are unchanged. 5. New areas of irregular atelectasis or consolidation of the peripheral right lower lobe, nonspecific and most likely infectious or inflammatory. Metastatic disease distinctly not favored. Attention on follow-up. 6. Atrophic, scarred left kidney with nonobstructive calculi of the inferior pole. No hydronephrosis.   Aortic Atherosclerosis (ICD10-I70.0).     06/18/2024 -  Chemotherapy   5-FU and Bevacizumab  maintenance.    Patient has bipolar and schizophrenia..  Patient is married  She has housing issue due to previous history of eviction.  she does not live with her husband currently. She lives with some family members.   + chronic dysuria and urgency she was seen by Urology and was started on Gemteza.  Symptoms are improved.    INTERVAL HISTORY April Luna is a 53 y.o. female who has above history reviewed by me today presents for follow up visit for rectal cancer.   Denies any melena or blood in the stool. Stable neuropathy symptoms. + loose BM, stable baseline. She feels extremely fatigued for 3 days after treatments +good appetite, weight  is stable.    Review of Systems  Constitutional:  Positive for fatigue. Negative for appetite change, chills and fever.  HENT:   Negative for hearing loss, mouth sores and voice change.   Eyes:  Negative for eye problems.  Respiratory:  Negative for chest tightness, cough and shortness of breath.   Cardiovascular:  Negative for chest pain.  Gastrointestinal:  Positive for diarrhea. Negative for abdominal distention, abdominal pain, blood in stool and nausea.       + colostomy   Endocrine: Negative for hot flashes.  Genitourinary:  Negative for difficulty urinating, dysuria and frequency.   Musculoskeletal:  Negative for arthralgias.  Skin:  Negative for itching and rash.  Neurological:  Positive for numbness. Negative for extremity weakness.  Hematological:  Negative for adenopathy.  Psychiatric/Behavioral:  Negative for confusion.     MEDICAL HISTORY:  Past Medical History:  Diagnosis Date   Allergy    pollen   Anxiety    Arthritis    right hip   Bipolar 1 disorder (HCC)    Cancer (HCC)    rectal   Chemotherapy-induced neuropathy (HCC) 10/24/2022   Chronic kidney disease    COPD (chronic obstructive pulmonary disease) (HCC)    Depression    Family history of adverse reaction to anesthesia    grand father had a stroke during anesthesia   Family history of breast cancer    Family history of colon cancer    Family history of uterine cancer    GERD (gastroesophageal reflux  disease)    History of kidney stones    Hyperlipidemia    Hypertension    Hypothyroidism    Panic attack    Pneumonia    Psoriasis    Sleep apnea 08/11/2021   No CPAP   Type 2 diabetes mellitus with microalbuminuria, without long-term current use of insulin  (HCC) 06/24/2019    SURGICAL HISTORY: Past Surgical History:  Procedure Laterality Date   BREAST BIOPSY Left 12/14/2021   us  bx, venus marker, path pending   CESAREAN SECTION     COLONOSCOPY WITH PROPOFOL  N/A 02/09/2022   Procedure:  COLONOSCOPY WITH PROPOFOL ;  Surgeon: Unk Corinn Skiff, MD;  Location: [facility];  Service: Endoscopy;  Laterality: N/A;  sleep apnea   CYSTOSCOPY W/ RETROGRADES Left 11/08/2018   Procedure: CYSTOSCOPY WITH RETROGRADE PYELOGRAM;  Surgeon: Francisca Redell BROCKS, MD;  Location: [facility];  Service: Urology;  Laterality: Left;   CYSTOSCOPY/URETEROSCOPY/HOLMIUM LASER/STENT PLACEMENT Left 11/08/2018   Procedure: CYSTOSCOPY/URETEROSCOPY/HOLMIUM LASER/STENT PLACEMENT;  Surgeon: Francisca Redell BROCKS, MD;  Location: [facility];  Service: Urology;  Laterality: Left;   IR CV LINE INJECTION  06/25/2023   IR IMAGING GUIDED PORT INSERTION  05/11/2022   IR REMOVE CV FIBRIN SHEATH  06/27/2023   MOUTH SURGERY     wisdom teeth extraction   MOUTH SURGERY     teeth removal   POLYPECTOMY N/A 02/09/2022   Procedure: POLYPECTOMY;  Surgeon: Unk Corinn Skiff, MD;  Location: [facility];  Service: Endoscopy;  Laterality: N/A;   XI ROBOTIC ASSISTED LOWER ANTERIOR RESECTION N/A 04/21/2022   Procedure: XI ROBOTIC ASSISTED LOWER ANTERIOR RESECTION WITH COLOSTOMY, BILATERAL TAP BLOCK, ASSESSMENT OF TISSUE PERFUSSION WITH FIREFLY INJECTION;  Surgeon: Teresa Lonni HERO, MD;  Location: [facility];  Service: General;  Laterality: N/A;    SOCIAL HISTORY: Social History   Socioeconomic History   Marital status: Married    Spouse name: Morene Jama Pereyra   Number of children: 1   Years of education: 12   Highest education level: High school graduate  Occupational History   Occupation: unemployed    Comment: disabled  Tobacco Use   Smoking status: Some Days    Current packs/day: 0.25    Average packs/day: 0.2 packs/day for 39.1 years (9.8 ttl pk-yrs)    Types: Cigarettes    Start date: 05/13/1985    Passive exposure: Current   Smokeless tobacco: Never   Tobacco comments:    5-6 cigarettes weekly- 11/07/2022  Vaping Use   Vaping status: Former   Start date: 05/25/2018   Quit date: 09/24/2018  Substance and Sexual  Activity   Alcohol use: Not Currently    Alcohol/week: 4.0 standard drinks of alcohol    Types: 4 Glasses of wine per week    Comment: quit ETOH in Feb. 2023   Drug use: Yes    Types: Marijuana    Comment: 2 days ago   Sexual activity: Not Currently    Birth control/protection: Post-menopausal  Other Topics Concern   Not on file  Social History Narrative   Lives with husband and son    Social Drivers of Health   Financial Resource Strain: High Risk (08/30/2023)   Overall Financial Resource Strain (CARDIA)    Difficulty of Paying Living Expenses: Hard  Food Insecurity: Food Insecurity Present (04/22/2024)   Hunger Vital Sign    Worried About Running Out of Food in the Last Year: Sometimes true    Ran Out of Food in the Last Year: Sometimes true  Transportation Needs: No Transportation Needs (08/30/2023)   PRAPARE - Administrator, Civil Service (Medical): No    Lack of Transportation (Non-Medical): No  Physical Activity: Inactive (08/30/2023)   Exercise Vital Sign    Days of Exercise per Week: 0 days    Minutes of Exercise per Session: 0 min  Stress: Stress Concern Present (08/30/2023)   [employer] of Occupational Health - Occupational Stress Questionnaire    Feeling of Stress : Very much  Social Connections: Moderately Integrated (08/30/2023)   Social Connection and Isolation Panel    Frequency of Communication with Friends and Family: Never    Frequency of Social Gatherings with Friends and Family: Once a week    Attends Religious Services: More than 4 times per year    Active Member of [employer] or Organizations: No    Attends Engineer, structural: 1 to 4 times per year    Marital Status: Married  Catering manager Violence: Not At Risk (08/30/2023)   Humiliation, Afraid, Rape, and Kick questionnaire    Fear of Current or Ex-Partner: No    Emotionally Abused: No    Physically Abused: No    Sexually Abused: No    FAMILY HISTORY: Family History  Problem  Relation Age of Onset   Depression Mother    Anxiety disorder Mother    Diabetes Mother    Hypertension Mother    Hyperlipidemia Mother    Cancer Mother    Uterine cancer Mother 47   Cervical cancer Mother 59   Colon cancer Father    Depression Brother    Anxiety disorder Brother    Cancer Maternal Aunt        unk types   Diabetes Mellitus II Maternal Grandmother    Hypercholesterolemia Maternal Grandmother    Breast cancer Maternal Grandmother    Cancer Paternal Grandmother    Diabetes Paternal Grandmother    Melanoma Paternal Grandmother    Stomach cancer Paternal Grandmother     ALLERGIES:  is allergic to metformin and related, nsaids, perphenazine, sulfa antibiotics, abilify [aripiprazole], and penicillins.  MEDICATIONS:  Current Outpatient Medications  Medication Sig Dispense Refill   acetaminophen  (TYLENOL ) 325 MG tablet Take 650 mg by mouth every 6 (six) hours as needed for headache (pain).     albuterol  (VENTOLIN  HFA) 108 (90 Base) MCG/ACT inhaler Inhale 2 puffs into the lungs every 6 (six) hours as needed for wheezing or shortness of breath. 1 each 5   amantadine  (SYMMETREL ) 100 MG capsule Take 100 mg by mouth 2 (two) times daily.     blood glucose meter kit and supplies Dispense based on patient and insurance preference. Use up to four times daily as directed. (FOR ICD-10 E10.9, E11.9). 1 each 0   Budeson-Glycopyrrol-Formoterol  (BREZTRI  AEROSPHERE) 160-9-4.8 MCG/ACT AERO Inhale 2 puffs into the lungs in the morning and at bedtime. 10.7 g 2   buPROPion  (WELLBUTRIN  XL) 300 MG 24 hr tablet Take 300 mg by mouth daily.     Cholecalciferol  (VITAMIN D -3) 125 MCG (5000 UT) TABS Take 5,000 Units by mouth daily. 30 tablet 1   clonazePAM (KLONOPIN) 0.5 MG tablet Take 1 mg by mouth 2 (two) times daily as needed.     dexamethasone  (DECADRON ) 4 MG tablet Take 2 tablets (8 mg total) by mouth See admin instructions. Take 8mg  daily for 2 days after each chemotherapy. 30 tablet 1    diltiazem  (CARDIZEM  CD) 120 MG 24 hr capsule TAKE 1 CAPSULE BY MOUTH DAILY 90  capsule 1   docusate sodium  (COLACE) 100 MG capsule TAKE 1 CAPSULE BY MOUTH TWICE DAILY 60 capsule 2   FARXIGA  10 MG TABS tablet TAKE 1 TABLET BY MOUTH DAILY BEFORE BREAKFAST 90 tablet 0   gabapentin  (NEURONTIN ) 300 MG capsule Take 300 mg by mouth 2 (two) times daily.     icosapent  Ethyl (VASCEPA ) 1 g capsule Take 2 capsules (2 g total) by mouth in the morning and at bedtime. 120 capsule 2   levothyroxine  (SYNTHROID ) 75 MCG tablet TAKE 1 TABLET BY MOUTH DAILY BEFORE BREAKFAST 90 tablet 2   lidocaine -prilocaine  (EMLA ) cream Apply to affected area once 30 g 3   loperamide  (IMODIUM ) 2 MG capsule Take 1 capsule (2 mg total) by mouth See admin instructions. Initial: 4 mg,the 2 mg every 2 hours (4 mg every 4 hours at night)  maximum: 16 mg/day 120 capsule 2   loratadine  (CLARITIN ) 10 MG tablet Take 10 mg by mouth every morning.     magic mouthwash w/lidocaine  SOLN Take 5 mLs by mouth 4 (four) times daily as needed for mouth pain. Sig: Swish/Swallow 5-10 ml four times a day as needed. Dispense 480 ml. 1RF 480 mL 1   ondansetron  (ZOFRAN ) 8 MG tablet Take 1 tablet (8 mg total) by mouth every 8 (eight) hours as needed for nausea, vomiting or refractory nausea / vomiting. Start on the third day after chemotherapy. 30 tablet 1   paliperidone  (INVEGA  SUSTENNA) 234 MG/1.5ML SUSY injection Inject 234 mg into the muscle every 30 (thirty) days. On or about the 14th of each month     pantoprazole  (PROTONIX ) 40 MG tablet TAKE 1 TABLET BY MOUTH DAILY 90 tablet 2   pioglitazone  (ACTOS ) 15 MG tablet Take 1 tablet (15 mg total) by mouth daily. 90 tablet 1   potassium chloride  SA (KLOR-CON  M) 20 MEQ tablet Take 1 tablet (20 mEq total) by mouth daily. 30 tablet 0   promethazine  (PHENERGAN ) 12.5 MG tablet Take 1 tablet (12.5 mg total) by mouth every 6 (six) hours as needed for nausea or vomiting. 30 tablet 0   rosuvastatin  (CRESTOR ) 40 MG tablet  TAKE 1 TABLET BY MOUTH EVERY MORNING 90 tablet 0   Spacer/Aero-Holding Chambers (AEROCHAMBER MV) inhaler Use as instructed 1 each 0   Vibegron  (GEMTESA ) 75 MG TABS Take 1 tablet (75 mg total) by mouth daily.     sertraline  (ZOLOFT ) 100 MG tablet Take 200 mg by mouth every morning. (Patient not taking: Reported on 07/02/2024)     No current facility-administered medications for this visit.   Facility-Administered Medications Ordered in Other Visits  Medication Dose Route Frequency Provider Last Rate Last Admin   albuterol  (PROVENTIL ) (2.5 MG/3ML) 0.083% nebulizer solution 2.5 mg  2.5 mg Nebulization Once Dgayli, Khabib, MD       fluorouracil  (ADRUCIL ) 3,850 mg in sodium chloride  0.9 % 73 mL chemo infusion  2,400 mg/m2 (Treatment Plan Recorded) Intravenous 1 day or 1 dose Babara Call, MD   3,850 mg at 07/02/24 1411   sodium chloride  flush (NS) 0.9 % injection 10 mL  10 mL Intracatheter PRN Babara Call, MD   10 mL at 08/15/23 1327   sodium chloride  flush (NS) 0.9 % injection 10 mL  10 mL Intracatheter PRN Babara Call, MD   10 mL at 03/26/24 1255     PHYSICAL EXAMINATION: ECOG PERFORMANCE STATUS: 0 - Asymptomatic Vitals:   07/02/24 1150  BP: 106/83  Pulse: 97  Resp: 18  Temp: 97.8 F (36.6 C)  SpO2: 97%    Filed Weights   07/02/24 1150  Weight: 124 lb (56.2 kg)     Physical Exam Constitutional:      General: She is not in acute distress. HENT:     Head: Normocephalic and atraumatic.  Eyes:     General: No scleral icterus. Cardiovascular:     Rate and Rhythm: Normal rate and regular rhythm.  Pulmonary:     Effort: Pulmonary effort is normal. No respiratory distress.     Breath sounds: No wheezing.     Comments: Decreased breath sound bilaterally Abdominal:     General: Bowel sounds are normal. There is no distension.     Palpations: Abdomen is soft.     Comments: +colostomy   Musculoskeletal:        General: No deformity. Normal range of motion.     Cervical back: Normal range of  motion and neck supple.  Skin:    General: Skin is warm and dry.     Findings: No erythema or rash.  Neurological:     Mental Status: She is alert and oriented to person, place, and time. Mental status is at baseline.     Cranial Nerves: No cranial nerve deficit.     Coordination: Coordination normal.  Psychiatric:        Mood and Affect: Mood normal.     LABORATORY DATA:  I have reviewed the data as listed    Latest Ref Rng & Units 07/02/2024   11:29 AM 06/18/2024    8:31 AM 06/03/2024    8:20 AM  CBC  WBC 4.0 - 10.5 K/uL 4.9  6.1  7.9   Hemoglobin 12.0 - 15.0 g/dL 86.4  86.3  85.6   Hematocrit 36.0 - 46.0 % 39.6  41.1  43.9   Platelets 150 - 400 K/uL 135  157  147       Latest Ref Rng & Units 07/02/2024   11:29 AM 06/18/2024    8:31 AM 06/03/2024    8:20 AM  CMP  Glucose 70 - 99 mg/dL 858  861  860   BUN 6 - 20 mg/dL 16  19  14    Creatinine 0.44 - 1.00 mg/dL 9.02  9.08  9.00   Sodium 135 - 145 mmol/L 135  137  140   Potassium 3.5 - 5.1 mmol/L 4.3  3.7  3.7   Chloride 98 - 111 mmol/L 103  106  105   CO2 22 - 32 mmol/L 24  22  25    Calcium  8.9 - 10.3 mg/dL 9.0  8.8  8.7   Total Protein 6.5 - 8.1 g/dL 6.7  6.6  6.9   Total Bilirubin 0.0 - 1.2 mg/dL 0.4  0.6  0.4   Alkaline Phos 38 - 126 U/L 72  67  77   AST 15 - 41 U/L 20  19  17    ALT 0 - 44 U/L 12  10  12          RADIOGRAPHIC STUDIES: I have personally reviewed the radiological images as listed and agreed with the findings in the report. No results found.

## 2024-07-02 NOTE — Progress Notes (Signed)
 Patient is doing ok, no new questions or concerns for the doctor today.

## 2024-07-02 NOTE — Assessment & Plan Note (Signed)
 Chemotherapy plan as listed above

## 2024-07-02 NOTE — Assessment & Plan Note (Signed)
 History of Stage III. pT3 pN1b cM0, s/p APR. 05/2023 Stage IV current rectal adenocarcinoma Currently on adjuvant chemotherapy. S/p FOLFOX x 4 cycles S/p concurrent chemotherapy [Xeloda  1300 mg BID] with RT, and then another 4 cycles of adjuvant FOLFOX [dose reduced oxaliplatin  and omit 5-FU bolus 05/2023 CT imaging indicates disease progression. liver biopsy pathology showed metastatic carcinoma--> 06/2023 FOLFIRI + Bevacizumab --> 10/2023 and 02/2024 CT partial response --> 05/2024 CT stable NGS showed TP53 missense variant, APC frameshift, TCF7L2 Frameshift, FLT3 copy number gain., TMB 4.7, pMMR Wildtype KRAS/NRAS  Labs are reviewed and discussed with patient. Shared decision was made to hold off irinotecan  given stable disease. .  Check Signatera circulating tumor DNA Proceed with  5-Fu + Bevacizumab 

## 2024-07-02 NOTE — Patient Instructions (Signed)
 [facility] - A DEPT OF [facility]  Discharge Instructions: Thank you for choosing [facility] to provide your oncology and hematology care.  If you have a lab appointment with the Cancer Center, please go directly to the Cancer Center and check in at the registration area.  Wear comfortable clothing and clothing appropriate for easy access to any Portacath or PICC line.   We strive to give you quality time with your provider. You may need to reschedule your appointment if you arrive late (15 or more minutes).  Arriving late affects you and other patients whose appointments are after yours.  Also, if you miss three or more appointments without notifying the office, you may be dismissed from the clinic at the provider's discretion.      For prescription refill requests, have your pharmacy contact our office and allow 72 hours for refills to be completed.    Today you received the following chemotherapy and/or immunotherapy agents MVASI , Leucovorin , Adrucil       To help prevent nausea and vomiting after your treatment, we encourage you to take your nausea medication as directed.  BELOW ARE SYMPTOMS THAT SHOULD BE REPORTED IMMEDIATELY: *FEVER GREATER THAN 100.4 F (38 C) OR HIGHER *CHILLS OR SWEATING *NAUSEA AND VOMITING THAT IS NOT CONTROLLED WITH YOUR NAUSEA MEDICATION *UNUSUAL SHORTNESS OF BREATH *UNUSUAL BRUISING OR BLEEDING *URINARY PROBLEMS (pain or burning when urinating, or frequent urination) *BOWEL PROBLEMS (unusual diarrhea, constipation, pain near the anus) TENDERNESS IN MOUTH AND THROAT WITH OR WITHOUT PRESENCE OF ULCERS (sore throat, sores in mouth, or a toothache) UNUSUAL RASH, SWELLING OR PAIN  UNUSUAL VAGINAL DISCHARGE OR ITCHING   Items with * indicate a potential emergency and should be followed up as soon as possible or go to the Emergency Department if any problems should occur.  Please show the CHEMOTHERAPY ALERT CARD or  IMMUNOTHERAPY ALERT CARD at check-in to the Emergency Department and triage nurse.  Should you have questions after your visit or need to cancel or reschedule your appointment, please contact [facility] - A DEPT OF [facility]  [phone] and follow the prompts.  Office hours are 8:00 a.m. to 4:30 p.m. Monday - Friday. Please note that voicemails left after 4:00 p.m. may not be returned until the following business day.  We are closed weekends and major holidays. You have access to a nurse at all times for urgent questions. Please call the main number to the clinic [phone] and follow the prompts.  For any non-urgent questions, you may also contact your provider using MyChart. We now offer e-Visits for anyone 66 and older to request care online for non-urgent symptoms. For details visit [email or web address].   Also download the MyChart app! Go to the app store, search MyChart, open the app, select [facility], and log in with your MyChart username and password.

## 2024-07-02 NOTE — Assessment & Plan Note (Signed)
 Stable K level. Continue oral KCL 20meq daily.

## 2024-07-03 LAB — CEA: CEA: 4 ng/mL (ref 0.0–4.7)

## 2024-07-04 ENCOUNTER — Inpatient Hospital Stay

## 2024-07-04 DIAGNOSIS — C20 Malignant neoplasm of rectum: Secondary | ICD-10-CM | POA: Diagnosis not present

## 2024-07-04 MED ORDER — HEPARIN SOD (PORK) LOCK FLUSH 100 UNIT/ML IV SOLN
500.0000 [IU] | Freq: Once | INTRAVENOUS | Status: AC | PRN
  Administered 2024-07-04: 500 [IU]
  Filled 2024-07-04: qty 5

## 2024-07-10 ENCOUNTER — Encounter: Payer: Self-pay | Admitting: Oncology

## 2024-07-10 ENCOUNTER — Other Ambulatory Visit: Payer: Self-pay | Admitting: Oncology

## 2024-07-10 DIAGNOSIS — C20 Malignant neoplasm of rectum: Secondary | ICD-10-CM

## 2024-07-11 ENCOUNTER — Encounter: Payer: Self-pay | Admitting: Nurse Practitioner

## 2024-07-11 ENCOUNTER — Encounter: Payer: Self-pay | Admitting: Oncology

## 2024-07-11 ENCOUNTER — Inpatient Hospital Stay

## 2024-07-11 NOTE — Progress Notes (Signed)
 [facility] CSW Progress Note  Clinical Child psychotherapist contacted patient by phone to follow-up on financial concerns.    Interventions: Referred patient to community resources: [employer] and [employer].  Patient has been receiving food from the [facility] food pantry and [employer].  She said she used all of her [employer] from last year.       Follow Up Plan:  CSW will follow-up with patient by phone     [name], LCSW Clinical Social Worker [facility]

## 2024-07-16 ENCOUNTER — Inpatient Hospital Stay

## 2024-07-16 ENCOUNTER — Inpatient Hospital Stay (HOSPITAL_BASED_OUTPATIENT_CLINIC_OR_DEPARTMENT_OTHER): Admitting: Oncology

## 2024-07-16 ENCOUNTER — Encounter: Payer: Self-pay | Admitting: Oncology

## 2024-07-16 VITALS — BP 129/74 | HR 82 | Temp 96.9°F | Resp 16 | Wt 121.9 lb

## 2024-07-16 DIAGNOSIS — C20 Malignant neoplasm of rectum: Secondary | ICD-10-CM

## 2024-07-16 DIAGNOSIS — T451X5A Adverse effect of antineoplastic and immunosuppressive drugs, initial encounter: Secondary | ICD-10-CM

## 2024-07-16 DIAGNOSIS — Z5111 Encounter for antineoplastic chemotherapy: Secondary | ICD-10-CM | POA: Diagnosis not present

## 2024-07-16 DIAGNOSIS — G620 Drug-induced polyneuropathy: Secondary | ICD-10-CM | POA: Diagnosis not present

## 2024-07-16 DIAGNOSIS — K521 Toxic gastroenteritis and colitis: Secondary | ICD-10-CM

## 2024-07-16 DIAGNOSIS — E876 Hypokalemia: Secondary | ICD-10-CM

## 2024-07-16 LAB — CMP (CANCER CENTER ONLY)
ALT: 15 U/L (ref 0–44)
AST: 22 U/L (ref 15–41)
Albumin: 3.8 g/dL (ref 3.5–5.0)
Alkaline Phosphatase: 82 U/L (ref 38–126)
Anion gap: 7 (ref 5–15)
BUN: 8 mg/dL (ref 6–20)
CO2: 24 mmol/L (ref 22–32)
Calcium: 9.2 mg/dL (ref 8.9–10.3)
Chloride: 106 mmol/L (ref 98–111)
Creatinine: 1 mg/dL (ref 0.44–1.00)
GFR, Estimated: >60 mL/min (ref >60)
Glucose, Bld: 161 mg/dL — ABNORMAL HIGH (ref 70–99)
Potassium: 3.5 mmol/L (ref 3.5–5.1)
Sodium: 137 mmol/L (ref 135–145)
Total Bilirubin: 0.4 mg/dL (ref 0.0–1.2)
Total Protein: 6.4 g/dL — ABNORMAL LOW (ref 6.5–8.1)

## 2024-07-16 LAB — CBC WITH DIFFERENTIAL (CANCER CENTER ONLY)
Abs Immature Granulocytes: 0.03 K/uL (ref 0.00–0.07)
Basophils Absolute: 0.1 K/uL (ref 0.0–0.1)
Basophils Relative: 1 %
Eosinophils Absolute: 0.1 K/uL (ref 0.0–0.5)
Eosinophils Relative: 1 %
HCT: 38.9 % (ref 36.0–46.0)
Hemoglobin: 13.2 g/dL (ref 12.0–15.0)
Immature Granulocytes: 0 %
Lymphocytes Relative: 7 %
Lymphs Abs: 0.7 K/uL (ref 0.7–4.0)
MCH: 33.7 pg (ref 26.0–34.0)
MCHC: 33.9 g/dL (ref 30.0–36.0)
MCV: 99.2 fL (ref 80.0–100.0)
Monocytes Absolute: 0.7 K/uL (ref 0.1–1.0)
Monocytes Relative: 8 %
Neutro Abs: 7.3 K/uL (ref 1.7–7.7)
Neutrophils Relative %: 83 %
Platelet Count: 105 K/uL — ABNORMAL LOW (ref 150–400)
RBC: 3.92 MIL/uL (ref 3.87–5.11)
RDW: 14.9 % (ref 11.5–15.5)
WBC Count: 8.9 K/uL (ref 4.0–10.5)
nRBC: 0 % (ref 0.0–0.2)

## 2024-07-16 MED ORDER — DEXAMETHASONE SODIUM PHOSPHATE 10 MG/ML IJ SOLN
10.0000 mg | Freq: Once | INTRAMUSCULAR | Status: AC
  Administered 2024-07-16: 10 mg via INTRAVENOUS
  Filled 2024-07-16: qty 1

## 2024-07-16 MED ORDER — SODIUM CHLORIDE 0.9 % IV SOLN
Freq: Once | INTRAVENOUS | Status: AC
Start: 2024-07-16 — End: 2024-07-16
  Filled 2024-07-16: qty 250

## 2024-07-16 MED ORDER — SODIUM CHLORIDE 0.9 % IV SOLN
400.0000 mg/m2 | Freq: Once | INTRAVENOUS | Status: AC
  Administered 2024-07-16: 644 mg via INTRAVENOUS
  Filled 2024-07-16: qty 32.2

## 2024-07-16 MED ORDER — ATROPINE SULFATE 1 MG/ML IV SOLN
0.5000 mg | Freq: Once | INTRAVENOUS | Status: DC | PRN
  Filled 2024-07-16: qty 1

## 2024-07-16 MED ORDER — SODIUM CHLORIDE 0.9 % IV SOLN
2400.0000 mg/m2 | INTRAVENOUS | Status: DC
  Administered 2024-07-16: 3850 mg via INTRAVENOUS
  Filled 2024-07-16: qty 77

## 2024-07-16 MED ORDER — SODIUM CHLORIDE 0.9 % IV SOLN
5.0000 mg/kg | Freq: Once | INTRAVENOUS | Status: AC
  Administered 2024-07-16: 300 mg via INTRAVENOUS
  Filled 2024-07-16: qty 12

## 2024-07-16 MED ORDER — FLUOROURACIL CHEMO INJECTION 2.5 GM/50ML
400.0000 mg/m2 | Freq: Once | INTRAVENOUS | Status: AC
  Administered 2024-07-16: 650 mg via INTRAVENOUS
  Filled 2024-07-16: qty 13

## 2024-07-16 NOTE — Assessment & Plan Note (Signed)
 Grade 2  Continue gabapentin 300mg  BID.

## 2024-07-16 NOTE — Assessment & Plan Note (Signed)
 History of Stage III. pT3 pN1b cM0, s/p APR. 05/2023 Stage IV current rectal adenocarcinoma Currently on adjuvant chemotherapy. S/p FOLFOX x 4 cycles S/p concurrent chemotherapy [Xeloda  1300 mg BID] with RT, and then another 4 cycles of adjuvant FOLFOX [dose reduced oxaliplatin  and omit 5-FU bolus 05/2023 CT imaging indicates disease progression. liver biopsy pathology showed metastatic carcinoma--> 06/2023 FOLFIRI + Bevacizumab --> 10/2023 and 02/2024 CT partial response --> 05/2024 CT stable NGS showed TP53 missense variant, APC frameshift, TCF7L2 Frameshift, FLT3 copy number gain., TMB 4.7, pMMR Wildtype KRAS/NRAS  Labs are reviewed and discussed with patient. Shared decision was made to hold off irinotecan  given stable disease. .  Check Signatera circulating tumor DNA Proceed with  5-Fu + Bevacizumab 

## 2024-07-16 NOTE — Assessment & Plan Note (Signed)
 Chemotherapy plan as listed above

## 2024-07-16 NOTE — Assessment & Plan Note (Signed)
 Stable K level. Continue oral KCL 20meq daily.

## 2024-07-16 NOTE — Progress Notes (Signed)
 Hematology/Oncology Progress note [phone] [phone]      CHIEF COMPLAINTS/REASON FOR VISIT:  Follow-up for Stage IV  rectal cancer treatments.  ASSESSMENT & PLAN:   Cancer Staging  Rectal cancer [facility]) Staging form: Colon and Rectum, AJCC 8th Edition - Pathologic stage from 05/03/2022: Stage IIIB (pT3, pN1b, cM0) - Signed by Babara Call, MD on 05/03/2022 - Pathologic stage from 06/18/2023: Stage IVA (rpTX, rpN0, rpM1a) - Signed by Babara Call, MD on 06/18/2023   Rectal cancer [facility]) History of Stage III. pT3 pN1b cM0, s/p APR. 05/2023 Stage IV current rectal adenocarcinoma Currently on adjuvant chemotherapy. S/p FOLFOX x 4 cycles S/p concurrent chemotherapy [Xeloda  1300 mg BID] with RT, and then another 4 cycles of adjuvant FOLFOX [dose reduced oxaliplatin  and omit 5-FU bolus 05/2023 CT imaging indicates disease progression. liver biopsy pathology showed metastatic carcinoma--> 06/2023 FOLFIRI + Bevacizumab --> 10/2023 and 02/2024 CT partial response --> 05/2024 CT stable NGS showed TP53 missense variant, APC frameshift, TCF7L2 Frameshift, FLT3 copy number gain., TMB 4.7, pMMR Wildtype KRAS/NRAS  Labs are reviewed and discussed with patient. Shared decision was made to hold off irinotecan  given stable disease. .  Check Signatera circulating tumor DNA Proceed with  5-Fu + Bevacizumab      Chemotherapy induced diarrhea Recommend Imodium  PRN as directed.   Chemotherapy-induced neuropathy (HCC) Grade 2  Continue gabapentin  300mg  BID.   Encounter for antineoplastic chemotherapy Chemotherapy plan as listed above  Hypokalemia Stable K level. Continue oral KCL 20meq daily.  No orders of the defined types were placed in this encounter.   Follow-up 2 week(s)  All questions were answered. The patient knows to call the clinic with any problems, questions or concerns.  Call Babara, MD, PhD [facility] Hematology Oncology 07/16/2024     HISTORY OF PRESENTING  ILLNESS:   April Luna is a  53 y.o.  female presents for treatment of rectal cancer Oncology History  Rectal cancer (HCC)  02/26/2022 Imaging   MRI PELVIS WITHOUT CONTRAST- By imaging, rectal cancer stage:  T1/T2, N0, Mx    03/02/2022 Imaging   CT CHEST AND ABDOMEN WITH CONTRAST 1. No convincing evidence of metastatic disease within the chest or abdomen. 2. Atrophic left kidney with multifocal renal scarring and cortical calcifications as well as nonobstructive renal stones measuring up to 5 mm. 3. Prominent left-sided predominant retroperitoneal lymph nodes measuring up to 8 mm near the level of the renal hilum, overall decreased in size dating back to CT September 19, 2018 and favored reactive related to left renal inflammation. 4.  Aortic Atherosclerosis (ICD10-I70.0).   03/27/2022 Genetic Testing    [facility] CustomNext+RNA cancer panel found no pathogenic mutations.    04/21/2022 Initial Diagnosis   Rectal cancer - baseline CEA 3.6 -02/09/2022, patient had colonoscopy which showed renal mass 10 cm from anal verge.  5 mm polyp in ascending colon.  Removed and retrieved. Pathology showed rectal adenocarcinoma.  The polyp in the ascending colon is a tubular adenoma.  MRI showed cT1/T2N0 disease  -04/21/2022, patient underwent robotic assisted ultralow anterior resection. Pathology showed moderately differentiated adenocarcinoma, 4.5 cm in maximal extent, with focal extension through muscularis propria into perirectal soft tissue.  3 lymph nodes positive for metastatic carcinoma.  Negative margin.  pT3 pN1b, MSI stable.   05/03/2022 Cancer Staging   Staging form: Colon and Rectum, AJCC 8th Edition - Pathologic stage from 05/03/2022: Stage IIIB (pT3, pN1b, cM0) - Signed by Babara Call, MD on 05/03/2022 Stage prefix: Initial diagnosis   05/11/2022  Miscellaneous   Medi port placed by Dr.White   05/26/2022 -  Chemotherapy   FOLFOX Q2 weeks x 4   05/26/2022 - 07/09/2022 Chemotherapy   Patient is  on Treatment Plan : COLORECTAL FOLFOX q14d x 8 cycles     05/26/2022 - 12/13/2022 Chemotherapy   Patient is on Treatment Plan : COLORECTAL FOLFOX q14d x 4 months     07/27/2022 -  Chemotherapy   Concurrent chemotherapy- Xeloda   1300mg  BID and radiation.    07/27/2022 - 09/12/2022 Radiation Therapy    concurrent chemotherapy [Xeloda  1300 mg BID] with RT    09/20/2022 Imaging   CT Angiogram chest PET protocol There is no evidence of pulmonary artery embolism. There is no evidence of thoracic aortic dissection.   Small linear patchy alveolar infiltrate is seen in medial segment of right middle lobe suggesting atelectasis/pneumonia.     09/28/2022 - 09/30/2022 Hospital Admission   Admission due to acute respiratory failure with hypoxia, COPD exacerbation   09/28/2022 Imaging   CT angio chest PE protocol 1. Negative for acute PE or thoracic aortic dissection. 2. New ground-glass infiltrates anteriorly in bilateral upper lobes,may represent atypical edema, infectious or inflammatory process. 3.  Aortic Atherosclerosis     12/22/2022 Imaging   CT chest abdomen pelvis w contrast  Stable exam. No evidence of recurrent or metastatic carcinoma within the chest, abdomen, or pelvis.   Left nephrolithiasis and renal parenchymal atrophy. No evidence of ureteral calculi or hydronephrosis.   Aortic Atherosclerosis   05/31/2023 Imaging   CT chest abdomen pelvis with contrast showed 1. New hypodense 16 mm lesion in the right lobe of the liver with very subtle adjacent 5 mm lesion, suspicious for metastatic disease. 2. Small bilateral pulmonary nodules, some of which were subtly evident on prior examination only in retrospect, some of which demonstrate degree of cavitation, suspicious for metastatic disease. 3. Prominent retroperitoneal lymph nodes are similar prior. 4. Prior low anterior resection with Hartmann's pouch formation,similar small amount of perirectal/presacral soft tissue and fluid,favored  postsurgical/posttreatment change. Suggest continued attention on follow-up imaging. 5. Large volume of formed stool in the colon. 6. Nonobstructive left renal calculi measure under 1 cm.     06/08/2023 Relapse/Recurrence   Ultrasound-guided liver biopsy showed Pathology showed metastatic carcinoma, morphology consistent with patient's clinical history of rectal adenocarcinoma.  [employer] NGS 648 gene panel showed TP53 missense variant, APC frameshift, TCF7L2 frameshift, FLT3 copy number gain, TMB 4.9m/MB MSI stable.  Wildtype KRAS/NRAS  [facility] RNA Seq - ERBB3 overexpressed, VEGFA overexpressed, NRAS overexpressed.     06/18/2023 Cancer Staging   Staging form: Colon and Rectum, AJCC 8th Edition - Pathologic stage from 06/18/2023: Stage IVA (rpTX, pN0, pM1a) - Signed by Babara Call, MD on 06/18/2023 Stage prefix: Recurrence Total positive nodes: 0   07/02/2023 - 06/04/2024 Chemotherapy   COLORECTAL FOLFIRI + Bevacizumab  q14d      11/13/2023 Imaging   CT chest abdomen pelvis w contrast showed  1. Increase in cavitation with decreased soft tissue involving the scattered bilateral pulmonary nodules, consistent with treatment response. No new suspicious pulmonary nodules or masses. 2. Decrease in size and conspicuity of the hypodense lesions in the right lobe of the liver, consistent with treatment response. No new suspicious hepatic lesion identified. 3. Stable prominent nonspecific retroperitoneal lymph nodes. Suggest continued attention on follow-up imaging. 4. Similar surgical changes of prior low anterior resection with left anterior abdominal wall colostomy. Similar mesorectal/presacral soft tissue, no new suspicious enhancing nodularity. 5. Similar soft tissue  nodularity in the bilateral posterior gluteal subcutaneous soft tissues. 6. Moderate volume of formed stool in the colon. Correlate for constipation.   03/03/2024 Imaging   CT chest abdomen pelvis w contrast showed 1. Mild  response to therapy of pulmonary and hepatic metastasis. 2. No new or progressive disease. 3. Similar prominent but not pathologically sized abdominal retroperitoneal nodes, nonspecific. 4. Status post low anterior resection with descending colostomy. Possible constipation. 5. Age advanced coronary artery atherosclerosis. Recommend assessment of coronary risk factors. 6.  Aortic Atherosclerosis (ICD10-I70.0). 7. Left nephrolithiasis and renal scarring.    05/27/2024 Imaging   CT chest abdomen pelvis w contrast showed 1. Status post abdominoperineal resection with left lower quadrant end colostomy. 2. Unchanged enlarged retroperitoneal lymph nodes. 3. Unchanged small hypodense lesion of the posterior right lobe of the liver. 4. Very tiny, irregular cavitary nodules are unchanged. 5. New areas of irregular atelectasis or consolidation of the peripheral right lower lobe, nonspecific and most likely infectious or inflammatory. Metastatic disease distinctly not favored. Attention on follow-up. 6. Atrophic, scarred left kidney with nonobstructive calculi of the inferior pole. No hydronephrosis.   Aortic Atherosclerosis (ICD10-I70.0).     06/18/2024 -  Chemotherapy   5-FU and Bevacizumab  maintenance.    Patient has bipolar and schizophrenia..  Patient is married  She has housing issue due to previous history of eviction.  she does not live with her husband currently. She lives with some family members.   + chronic dysuria and urgency she was seen by Urology and was started on Gemteza.  Symptoms are improved.    INTERVAL HISTORY VEAR STATON is a 53 y.o. female who has above history reviewed by me today presents for follow up visit for rectal cancer.   Denies any melena or blood in the stool. Stable neuropathy symptoms. + loose BM, stable baseline. She feels extremely fatigued for 3 days after treatments. Today feels well.  +good appetite, weight is stable.    Review of  Systems  Constitutional:  Positive for fatigue. Negative for appetite change, chills and fever.  HENT:   Negative for hearing loss, mouth sores and voice change.   Eyes:  Negative for eye problems.  Respiratory:  Negative for chest tightness, cough and shortness of breath.   Cardiovascular:  Negative for chest pain.  Gastrointestinal:  Positive for diarrhea. Negative for abdominal distention, abdominal pain, blood in stool and nausea.       + colostomy   Endocrine: Negative for hot flashes.  Genitourinary:  Negative for difficulty urinating, dysuria and frequency.   Musculoskeletal:  Negative for arthralgias.  Skin:  Negative for itching and rash.  Neurological:  Positive for numbness. Negative for extremity weakness.  Hematological:  Negative for adenopathy.  Psychiatric/Behavioral:  Negative for confusion.     MEDICAL HISTORY:  Past Medical History:  Diagnosis Date   Allergy    pollen   Anxiety    Arthritis    right hip   Bipolar 1 disorder (HCC)    Cancer (HCC)    rectal   Chemotherapy-induced neuropathy (HCC) 10/24/2022   Chronic kidney disease    COPD (chronic obstructive pulmonary disease) (HCC)    Depression    Family history of adverse reaction to anesthesia    grand father had a stroke during anesthesia   Family history of breast cancer    Family history of colon cancer    Family history of uterine cancer    GERD (gastroesophageal reflux disease)    History of  kidney stones    Hyperlipidemia    Hypertension    Hypothyroidism    Panic attack    Pneumonia    Psoriasis    Sleep apnea 08/11/2021   No CPAP   Type 2 diabetes mellitus with microalbuminuria, without long-term current use of insulin  (HCC) 06/24/2019    SURGICAL HISTORY: Past Surgical History:  Procedure Laterality Date   BREAST BIOPSY Left 12/14/2021   us  bx, venus marker, path pending   CESAREAN SECTION     COLONOSCOPY WITH PROPOFOL  N/A 02/09/2022   Procedure: COLONOSCOPY WITH PROPOFOL ;   Surgeon: Unk Corinn Skiff, MD;  Location: [facility];  Service: Endoscopy;  Laterality: N/A;  sleep apnea   CYSTOSCOPY W/ RETROGRADES Left 11/08/2018   Procedure: CYSTOSCOPY WITH RETROGRADE PYELOGRAM;  Surgeon: Francisca Redell BROCKS, MD;  Location: [facility];  Service: Urology;  Laterality: Left;   CYSTOSCOPY/URETEROSCOPY/HOLMIUM LASER/STENT PLACEMENT Left 11/08/2018   Procedure: CYSTOSCOPY/URETEROSCOPY/HOLMIUM LASER/STENT PLACEMENT;  Surgeon: Francisca Redell BROCKS, MD;  Location: [facility];  Service: Urology;  Laterality: Left;   IR CV LINE INJECTION  06/25/2023   IR IMAGING GUIDED PORT INSERTION  05/11/2022   IR REMOVE CV FIBRIN SHEATH  06/27/2023   MOUTH SURGERY     wisdom teeth extraction   MOUTH SURGERY     teeth removal   POLYPECTOMY N/A 02/09/2022   Procedure: POLYPECTOMY;  Surgeon: Unk Corinn Skiff, MD;  Location: [facility];  Service: Endoscopy;  Laterality: N/A;   XI ROBOTIC ASSISTED LOWER ANTERIOR RESECTION N/A 04/21/2022   Procedure: XI ROBOTIC ASSISTED LOWER ANTERIOR RESECTION WITH COLOSTOMY, BILATERAL TAP BLOCK, ASSESSMENT OF TISSUE PERFUSSION WITH FIREFLY INJECTION;  Surgeon: Teresa Lonni HERO, MD;  Location: [facility];  Service: General;  Laterality: N/A;    SOCIAL HISTORY: Social History   Socioeconomic History   Marital status: Married    Spouse name: Morene Jama Pereyra   Number of children: 1   Years of education: 12   Highest education level: High school graduate  Occupational History   Occupation: unemployed    Comment: disabled  Tobacco Use   Smoking status: Some Days    Current packs/day: 0.25    Average packs/day: 0.3 packs/day for 39.2 years (9.8 ttl pk-yrs)    Types: Cigarettes    Start date: 05/13/1985    Passive exposure: Current   Smokeless tobacco: Never   Tobacco comments:    5-6 cigarettes weekly- 11/07/2022  Vaping Use   Vaping status: Former   Start date: 05/25/2018   Quit date: 09/24/2018  Substance and Sexual Activity   Alcohol use:  Not Currently    Alcohol/week: 4.0 standard drinks of alcohol    Types: 4 Glasses of wine per week    Comment: quit ETOH in Feb. 2023   Drug use: Yes    Types: Marijuana    Comment: 2 days ago   Sexual activity: Not Currently    Birth control/protection: Post-menopausal  Other Topics Concern   Not on file  Social History Narrative   Lives with husband and son    Social Drivers of Health   Financial Resource Strain: High Risk (08/30/2023)   Overall Financial Resource Strain (CARDIA)    Difficulty of Paying Living Expenses: Hard  Food Insecurity: Food Insecurity Present (06/03/2024)   Hunger Vital Sign    Worried About Running Out of Food in the Last Year: Sometimes true    Ran Out of Food in the Last Year: Sometimes true  Transportation Needs: No Transportation Needs (08/30/2023)  PRAPARE - Administrator, Civil Service (Medical): No    Lack of Transportation (Non-Medical): No  Physical Activity: Inactive (08/30/2023)   Exercise Vital Sign    Days of Exercise per Week: 0 days    Minutes of Exercise per Session: 0 min  Stress: Stress Concern Present (08/30/2023)   [employer] of Occupational Health - Occupational Stress Questionnaire    Feeling of Stress : Very much  Social Connections: Moderately Integrated (08/30/2023)   Social Connection and Isolation Panel    Frequency of Communication with Friends and Family: Never    Frequency of Social Gatherings with Friends and Family: Once a week    Attends Religious Services: More than 4 times per year    Active Member of [employer] or Organizations: No    Attends Engineer, structural: 1 to 4 times per year    Marital Status: Married  Catering manager Violence: Not At Risk (08/30/2023)   Humiliation, Afraid, Rape, and Kick questionnaire    Fear of Current or Ex-Partner: No    Emotionally Abused: No    Physically Abused: No    Sexually Abused: No    FAMILY HISTORY: Family History  Problem Relation Age of Onset    Depression Mother    Anxiety disorder Mother    Diabetes Mother    Hypertension Mother    Hyperlipidemia Mother    Cancer Mother    Uterine cancer Mother 35   Cervical cancer Mother 44   Colon cancer Father    Depression Brother    Anxiety disorder Brother    Cancer Maternal Aunt        unk types   Diabetes Mellitus II Maternal Grandmother    Hypercholesterolemia Maternal Grandmother    Breast cancer Maternal Grandmother    Cancer Paternal Grandmother    Diabetes Paternal Grandmother    Melanoma Paternal Grandmother    Stomach cancer Paternal Grandmother     ALLERGIES:  is allergic to metformin and related, nsaids, perphenazine, sulfa antibiotics, abilify [aripiprazole], and penicillins.  MEDICATIONS:  Current Outpatient Medications  Medication Sig Dispense Refill   acetaminophen  (TYLENOL ) 325 MG tablet Take 650 mg by mouth every 6 (six) hours as needed for headache (pain).     albuterol  (VENTOLIN  HFA) 108 (90 Base) MCG/ACT inhaler Inhale 2 puffs into the lungs every 6 (six) hours as needed for wheezing or shortness of breath. 1 each 5   amantadine  (SYMMETREL ) 100 MG capsule Take 100 mg by mouth 2 (two) times daily.     blood glucose meter kit and supplies Dispense based on patient and insurance preference. Use up to four times daily as directed. (FOR ICD-10 E10.9, E11.9). 1 each 0   Budeson-Glycopyrrol-Formoterol  (BREZTRI  AEROSPHERE) 160-9-4.8 MCG/ACT AERO Inhale 2 puffs into the lungs in the morning and at bedtime. 10.7 g 2   buPROPion  (WELLBUTRIN  XL) 300 MG 24 hr tablet Take 300 mg by mouth daily.     Cholecalciferol  (VITAMIN D -3) 125 MCG (5000 UT) TABS Take 5,000 Units by mouth daily. 30 tablet 1   clonazePAM (KLONOPIN) 0.5 MG tablet Take 1 mg by mouth 2 (two) times daily as needed.     dexamethasone  (DECADRON ) 4 MG tablet Take 2 tablets (8 mg total) by mouth See admin instructions. Take 8mg  daily for 2 days after each chemotherapy. 30 tablet 1   diltiazem  (CARDIZEM  CD) 120 MG  24 hr capsule TAKE 1 CAPSULE BY MOUTH DAILY 90 capsule 1   docusate sodium  (COLACE) 100  MG capsule TAKE 1 CAPSULE BY MOUTH TWICE DAILY 60 capsule 2   FARXIGA  10 MG TABS tablet TAKE 1 TABLET BY MOUTH DAILY BEFORE BREAKFAST 90 tablet 0   gabapentin  (NEURONTIN ) 300 MG capsule Take 300 mg by mouth 2 (two) times daily.     icosapent  Ethyl (VASCEPA ) 1 g capsule Take 2 capsules (2 g total) by mouth in the morning and at bedtime. 120 capsule 2   levothyroxine  (SYNTHROID ) 75 MCG tablet TAKE 1 TABLET BY MOUTH DAILY BEFORE BREAKFAST 90 tablet 2   lidocaine -prilocaine  (EMLA ) cream Apply to affected area once 30 g 3   loperamide  (IMODIUM ) 2 MG capsule Take 1 capsule (2 mg total) by mouth See admin instructions. Initial: 4 mg,the 2 mg every 2 hours (4 mg every 4 hours at night)  maximum: 16 mg/day 120 capsule 2   loratadine  (CLARITIN ) 10 MG tablet Take 10 mg by mouth every morning.     magic mouthwash w/lidocaine  SOLN Take 5 mLs by mouth 4 (four) times daily as needed for mouth pain. Sig: Swish/Swallow 5-10 ml four times a day as needed. Dispense 480 ml. 1RF 480 mL 1   ondansetron  (ZOFRAN ) 8 MG tablet Take 1 tablet (8 mg total) by mouth every 8 (eight) hours as needed for nausea, vomiting or refractory nausea / vomiting. Start on the third day after chemotherapy. 30 tablet 1   paliperidone  (INVEGA  SUSTENNA) 234 MG/1.5ML SUSY injection Inject 234 mg into the muscle every 30 (thirty) days. On or about the 14th of each month     pantoprazole  (PROTONIX ) 40 MG tablet TAKE 1 TABLET BY MOUTH DAILY 90 tablet 2   pioglitazone  (ACTOS ) 15 MG tablet Take 1 tablet (15 mg total) by mouth daily. 90 tablet 1   potassium chloride  SA (KLOR-CON  M) 20 MEQ tablet Take 1 tablet (20 mEq total) by mouth daily. 30 tablet 0   promethazine  (PHENERGAN ) 12.5 MG tablet Take 1 tablet (12.5 mg total) by mouth every 6 (six) hours as needed for nausea or vomiting. 30 tablet 0   rosuvastatin  (CRESTOR ) 40 MG tablet TAKE 1 TABLET BY MOUTH EVERY  MORNING 90 tablet 0   Spacer/Aero-Holding Chambers (AEROCHAMBER MV) inhaler Use as instructed 1 each 0   Vibegron  (GEMTESA ) 75 MG TABS Take 1 tablet (75 mg total) by mouth daily.     sertraline  (ZOLOFT ) 100 MG tablet Take 200 mg by mouth every morning. (Patient not taking: Reported on 07/16/2024)     No current facility-administered medications for this visit.   Facility-Administered Medications Ordered in Other Visits  Medication Dose Route Frequency Provider Last Rate Last Admin   albuterol  (PROVENTIL ) (2.5 MG/3ML) 0.083% nebulizer solution 2.5 mg  2.5 mg Nebulization Once Dgayli, Khabib, MD       sodium chloride  flush (NS) 0.9 % injection 10 mL  10 mL Intracatheter PRN Babara Call, MD   10 mL at 08/15/23 1327   sodium chloride  flush (NS) 0.9 % injection 10 mL  10 mL Intracatheter PRN Babara Call, MD   10 mL at 03/26/24 1255     PHYSICAL EXAMINATION: ECOG PERFORMANCE STATUS: 0 - Asymptomatic There were no vitals filed for this visit.   There were no vitals filed for this visit.    Physical Exam Constitutional:      General: She is not in acute distress. HENT:     Head: Normocephalic and atraumatic.  Eyes:     General: No scleral icterus. Cardiovascular:     Rate and Rhythm: Normal rate  and regular rhythm.  Pulmonary:     Effort: Pulmonary effort is normal. No respiratory distress.     Breath sounds: No wheezing.     Comments: Decreased breath sound bilaterally Abdominal:     General: Bowel sounds are normal. There is no distension.     Palpations: Abdomen is soft.     Comments: +colostomy   Musculoskeletal:        General: No deformity. Normal range of motion.     Cervical back: Normal range of motion and neck supple.  Skin:    General: Skin is warm and dry.     Findings: No erythema or rash.  Neurological:     Mental Status: She is alert and oriented to person, place, and time. Mental status is at baseline.     Cranial Nerves: No cranial nerve deficit.     Coordination:  Coordination normal.  Psychiatric:        Mood and Affect: Mood normal.     LABORATORY DATA:  I have reviewed the data as listed    Latest Ref Rng & Units 07/02/2024   11:29 AM 06/18/2024    8:31 AM 06/03/2024    8:20 AM  CBC  WBC 4.0 - 10.5 K/uL 4.9  6.1  7.9   Hemoglobin 12.0 - 15.0 g/dL 86.4  86.3  85.6   Hematocrit 36.0 - 46.0 % 39.6  41.1  43.9   Platelets 150 - 400 K/uL 135  157  147       Latest Ref Rng & Units 07/02/2024   11:29 AM 06/18/2024    8:31 AM 06/03/2024    8:20 AM  CMP  Glucose 70 - 99 mg/dL 858  861  860   BUN 6 - 20 mg/dL 16  19  14    Creatinine 0.44 - 1.00 mg/dL 9.02  9.08  9.00   Sodium 135 - 145 mmol/L 135  137  140   Potassium 3.5 - 5.1 mmol/L 4.3  3.7  3.7   Chloride 98 - 111 mmol/L 103  106  105   CO2 22 - 32 mmol/L 24  22  25    Calcium  8.9 - 10.3 mg/dL 9.0  8.8  8.7   Total Protein 6.5 - 8.1 g/dL 6.7  6.6  6.9   Total Bilirubin 0.0 - 1.2 mg/dL 0.4  0.6  0.4   Alkaline Phos 38 - 126 U/L 72  67  77   AST 15 - 41 U/L 20  19  17    ALT 0 - 44 U/L 12  10  12          RADIOGRAPHIC STUDIES: I have personally reviewed the radiological images as listed and agreed with the findings in the report. No results found.

## 2024-07-16 NOTE — Assessment & Plan Note (Signed)
Recommend Imodium PRN as directed.

## 2024-07-16 NOTE — Patient Instructions (Signed)
 [facility] - A DEPT OF [facility]  Discharge Instructions: Thank you for choosing [facility] to provide your oncology and hematology care.  If you have a lab appointment with the Cancer Center, please go directly to the Cancer Center and check in at the registration area.  Wear comfortable clothing and clothing appropriate for easy access to any Portacath or PICC line.   We strive to give you quality time with your provider. You may need to reschedule your appointment if you arrive late (15 or more minutes).  Arriving late affects you and other patients whose appointments are after yours.  Also, if you miss three or more appointments without notifying the office, you may be dismissed from the clinic at the provider's discretion.      For prescription refill requests, have your pharmacy contact our office and allow 72 hours for refills to be completed.    Today you received the following chemotherapy and/or immunotherapy agents MVASI , LEUCOVORIN , 5 FU      To help prevent nausea and vomiting after your treatment, we encourage you to take your nausea medication as directed.  BELOW ARE SYMPTOMS THAT SHOULD BE REPORTED IMMEDIATELY: *FEVER GREATER THAN 100.4 F (38 C) OR HIGHER *CHILLS OR SWEATING *NAUSEA AND VOMITING THAT IS NOT CONTROLLED WITH YOUR NAUSEA MEDICATION *UNUSUAL SHORTNESS OF BREATH *UNUSUAL BRUISING OR BLEEDING *URINARY PROBLEMS (pain or burning when urinating, or frequent urination) *BOWEL PROBLEMS (unusual diarrhea, constipation, pain near the anus) TENDERNESS IN MOUTH AND THROAT WITH OR WITHOUT PRESENCE OF ULCERS (sore throat, sores in mouth, or a toothache) UNUSUAL RASH, SWELLING OR PAIN  UNUSUAL VAGINAL DISCHARGE OR ITCHING   Items with * indicate a potential emergency and should be followed up as soon as possible or go to the Emergency Department if any problems should occur.  Please show the CHEMOTHERAPY ALERT CARD or  IMMUNOTHERAPY ALERT CARD at check-in to the Emergency Department and triage nurse.  Should you have questions after your visit or need to cancel or reschedule your appointment, please contact [facility] - A DEPT OF [facility]  [phone] and follow the prompts.  Office hours are 8:00 a.m. to 4:30 p.m. Monday - Friday. Please note that voicemails left after 4:00 p.m. may not be returned until the following business day.  We are closed weekends and major holidays. You have access to a nurse at all times for urgent questions. Please call the main number to the clinic [phone] and follow the prompts.  For any non-urgent questions, you may also contact your provider using MyChart. We now offer e-Visits for anyone 61 and older to request care online for non-urgent symptoms. For details visit [email or web address].   Also download the MyChart app! Go to the app store, search MyChart, open the app, select [facility], and log in with your MyChart username and password.  Bevacizumab  Injection What is this medication? BEVACIZUMAB  (be va SIZ yoo mab) treats some types of cancer. It works by blocking a protein that causes cancer cells to grow and multiply. This helps to slow or stop the spread of cancer cells. It is a monoclonal antibody. This medicine may be used for other purposes; ask your health care provider or pharmacist if you have questions. COMMON BRAND NAME(S): Alymsys , Avastin , MVASI , Vegzalma, Zirabev  What should I tell my care team before I take this medication? They need to know if you have any of  these conditions: Blood clots Coughing up blood Having or recent surgery Heart failure High blood pressure History of a connection between 2 or more body parts that do not usually connect (fistula) History of a tear in your stomach or intestines Protein in your urine An unusual or allergic reaction to bevacizumab , other medications, foods, dyes, or  preservatives Pregnant or trying to get pregnant Breast-feeding How should I use this medication? This medication is injected into a vein. It is given by your care team in a hospital or clinic setting. Talk to your care team the use of this medication in children. Special care may be needed. Overdosage: If you think you have taken too much of this medicine contact a poison control center or emergency room at once. NOTE: This medicine is only for you. Do not share this medicine with others. What if I miss a dose? Keep appointments for follow-up doses. It is important not to miss your dose. Call your care team if you are unable to keep an appointment. What may interact with this medication? Interactions are not expected. This list may not describe all possible interactions. Give your health care provider a list of all the medicines, herbs, non-prescription drugs, or dietary supplements you use. Also tell them if you smoke, drink alcohol, or use illegal drugs. Some items may interact with your medicine. What should I watch for while using this medication? Your condition will be monitored carefully while you are receiving this medication. You may need blood work while taking this medication. This medication may make you feel generally unwell. This is not uncommon as chemotherapy can affect healthy cells as well as cancer cells. Report any side effects. Continue your course of treatment even though you feel ill unless your care team tells you to stop. This medication may increase your risk to bruise or bleed. Call your care team if you notice any unusual bleeding. Before having surgery, talk to your care team to make sure it is ok. This medication can increase the risk of poor healing of your surgical site or wound. You will need to stop this medication for 28 days before surgery. After surgery, wait at least 28 days before restarting this medication. Make sure the surgical site or wound is healed enough  before restarting this medication. Talk to your care team if questions. Talk to your care team if you may be pregnant. Serious birth defects can occur if you take this medication during pregnancy and for 6 months after the last dose. Contraception is recommended while taking this medication and for 6 months after the last dose. Your care team can help you find the option that works for you. Do not breastfeed while taking this medication and for 6 months after the last dose. This medication can cause infertility. Talk to your care team if you are concerned about your fertility. What side effects may I notice from receiving this medication? Side effects that you should report to your care team as soon as possible: Allergic reactions--skin rash, itching, hives, swelling of the face, lips, tongue, or throat Bleeding--bloody or black, tar-like stools, vomiting blood or brown material that looks like coffee grounds, red or dark brown urine, small red or purple spots on skin, unusual bruising or bleeding Blood clot--pain, swelling, or warmth in the leg, shortness of breath, chest pain Heart attack--pain or tightness in the chest, shoulders, arms, or jaw, nausea, shortness of breath, cold or clammy skin, feeling faint or lightheaded Heart failure--shortness of breath, swelling  of the ankles, feet, or hands, sudden weight gain, unusual weakness or fatigue Increase in blood pressure Infection--fever, chills, cough, sore throat, wounds that don't heal, pain or trouble when passing urine, general feeling of discomfort or being unwell Infusion reactions--chest pain, shortness of breath or trouble breathing, feeling faint or lightheaded Kidney injury--decrease in the amount of urine, swelling of the ankles, hands, or feet Stomach pain that is severe, does not go away, or gets worse Stroke--sudden numbness or weakness of the face, arm, or leg, trouble speaking, confusion, trouble walking, loss of balance or  coordination, dizziness, severe headache, change in vision Sudden and severe headache, confusion, change in vision, seizures, which may be signs of posterior reversible encephalopathy syndrome (PRES) Side effects that usually do not require medical attention (report to your care team if they continue or are bothersome): Back pain Change in taste Diarrhea Dry skin Increased tears Nosebleed This list may not describe all possible side effects. Call your doctor for medical advice about side effects. You may report side effects to FDA at 1-800-FDA-1088. Where should I keep my medication? This medication is given in a hospital or clinic. It will not be stored at home. NOTE: This sheet is a summary. It may not cover all possible information. If you have questions about this medicine, talk to your doctor, pharmacist, or health care provider.  2024 Elsevier/Gold Standard (2022-04-28 00:00:00)  Leucovorin  Injection What is this medication? LEUCOVORIN  (loo koe VOR in) prevents side effects from certain medications, such as methotrexate. It works by increasing folate levels. This helps protect healthy cells in your body. It may also be used to treat anemia caused by low levels of folate. It can also be used with fluorouracil , a type of chemotherapy, to treat colorectal cancer. It works by increasing the effects of fluorouracil  in the body. This medicine may be used for other purposes; ask your health care provider or pharmacist if you have questions. What should I tell my care team before I take this medication? They need to know if you have any of these conditions: Anemia from low levels of vitamin B12 in the blood An unusual or allergic reaction to leucovorin , folic acid , other medications, foods, dyes, or preservatives Pregnant or trying to get pregnant Breastfeeding How should I use this medication? This medication is injected into a vein or a muscle. It is given by your care team in a hospital  or clinic setting. Talk to your care team about the use of this medication in children. Special care may be needed. Overdosage: If you think you have taken too much of this medicine contact a poison control center or emergency room at once. NOTE: This medicine is only for you. Do not share this medicine with others. What if I miss a dose? Keep appointments for follow-up doses. It is important not to miss your dose. Call your care team if you are unable to keep an appointment. What may interact with this medication? Capecitabine  Fluorouracil  Phenobarbital Phenytoin Primidone Trimethoprim ;sulfamethoxazole This list may not describe all possible interactions. Give your health care provider a list of all the medicines, herbs, non-prescription drugs, or dietary supplements you use. Also tell them if you smoke, drink alcohol, or use illegal drugs. Some items may interact with your medicine. What should I watch for while using this medication? Your condition will be monitored carefully while you are receiving this medication. This medication may increase the side effects of 5-fluorouracil . Tell your care team if you have diarrhea or  mouth sores that do not get better or that get worse. What side effects may I notice from receiving this medication? Side effects that you should report to your care team as soon as possible: Allergic reactions--skin rash, itching, hives, swelling of the face, lips, tongue, or throat This list may not describe all possible side effects. Call your doctor for medical advice about side effects. You may report side effects to FDA at 1-800-FDA-1088. Where should I keep my medication? This medication is given in a hospital or clinic. It will not be stored at home. NOTE: This sheet is a summary. It may not cover all possible information. If you have questions about this medicine, talk to your doctor, pharmacist, or health care provider.  2024 Elsevier/Gold Standard (2022-05-16  00:00:00)  Fluorouracil  Injection What is this medication? FLUOROURACIL  (flure oh YOOR a sil) treats some types of cancer. It works by slowing down the growth of cancer cells. This medicine may be used for other purposes; ask your health care provider or pharmacist if you have questions. COMMON BRAND NAME(S): Adrucil  What should I tell my care team before I take this medication? They need to know if you have any of these conditions: Blood disorders Dihydropyrimidine dehydrogenase (DPD) deficiency Infection, such as chickenpox, cold sores, herpes Kidney disease Liver disease Poor nutrition Recent or ongoing radiation therapy An unusual or allergic reaction to fluorouracil , other medications, foods, dyes, or preservatives If you or your partner are pregnant or trying to get pregnant Breast-feeding How should I use this medication? This medication is injected into a vein. It is administered by your care team in a hospital or clinic setting. Talk to your care team about the use of this medication in children. Special care may be needed. Overdosage: If you think you have taken too much of this medicine contact a poison control center or emergency room at once. NOTE: This medicine is only for you. Do not share this medicine with others. What if I miss a dose? Keep appointments for follow-up doses. It is important not to miss your dose. Call your care team if you are unable to keep an appointment. What may interact with this medication? Do not take this medication with any of the following: Live virus vaccines This medication may also interact with the following: Medications that treat or prevent blood clots, such as warfarin, enoxaparin , dalteparin This list may not describe all possible interactions. Give your health care provider a list of all the medicines, herbs, non-prescription drugs, or dietary supplements you use. Also tell them if you smoke, drink alcohol, or use illegal drugs. Some  items may interact with your medicine. What should I watch for while using this medication? Your condition will be monitored carefully while you are receiving this medication. This medication may make you feel generally unwell. This is not uncommon as chemotherapy can affect healthy cells as well as cancer cells. Report any side effects. Continue your course of treatment even though you feel ill unless your care team tells you to stop. In some cases, you may be given additional medications to help with side effects. Follow all directions for their use. This medication may increase your risk of getting an infection. Call your care team for advice if you get a fever, chills, sore throat, or other symptoms of a cold or flu. Do not treat yourself. Try to avoid being around people who are sick. This medication may increase your risk to bruise or bleed. Call your care team if  you notice any unusual bleeding. Be careful brushing or flossing your teeth or using a toothpick because you may get an infection or bleed more easily. If you have any dental work done, tell your dentist you are receiving this medication. Avoid taking medications that contain aspirin, acetaminophen , ibuprofen , naproxen , or ketoprofen unless instructed by your care team. These medications may hide a fever. Do not treat diarrhea with over the counter products. Contact your care team if you have diarrhea that lasts more than 2 days or if it is severe and watery. This medication can make you more sensitive to the sun. Keep out of the sun. If you cannot avoid being in the sun, wear protective clothing and sunscreen. Do not use sun lamps, tanning beds, or tanning booths. Talk to your care team if you or your partner wish to become pregnant or think you might be pregnant. This medication can cause serious birth defects if taken during pregnancy and for 3 months after the last dose. A reliable form of contraception is recommended while taking this  medication and for 3 months after the last dose. Talk to your care team about effective forms of contraception. Do not father a child while taking this medication and for 3 months after the last dose. Use a condom while having sex during this time period. Do not breastfeed while taking this medication. This medication may cause infertility. Talk to your care team if you are concerned about your fertility. What side effects may I notice from receiving this medication? Side effects that you should report to your care team as soon as possible: Allergic reactions--skin rash, itching, hives, swelling of the face, lips, tongue, or throat Heart attack--pain or tightness in the chest, shoulders, arms, or jaw, nausea, shortness of breath, cold or clammy skin, feeling faint or lightheaded Heart failure--shortness of breath, swelling of the ankles, feet, or hands, sudden weight gain, unusual weakness or fatigue Heart rhythm changes--fast or irregular heartbeat, dizziness, feeling faint or lightheaded, chest pain, trouble breathing High ammonia level--unusual weakness or fatigue, confusion, loss of appetite, nausea, vomiting, seizures Infection--fever, chills, cough, sore throat, wounds that don't heal, pain or trouble when passing urine, general feeling of discomfort or being unwell Low red blood cell level--unusual weakness or fatigue, dizziness, headache, trouble breathing Pain, tingling, or numbness in the hands or feet, muscle weakness, change in vision, confusion or trouble speaking, loss of balance or coordination, trouble walking, seizures Redness, swelling, and blistering of the skin over hands and feet Severe or prolonged diarrhea Unusual bruising or bleeding Side effects that usually do not require medical attention (report to your care team if they continue or are bothersome): Dry skin Headache Increased tears Nausea Pain, redness, or swelling with sores inside the mouth or throat Sensitivity  to light Vomiting This list may not describe all possible side effects. Call your doctor for medical advice about side effects. You may report side effects to FDA at 1-800-FDA-1088. Where should I keep my medication? This medication is given in a hospital or clinic. It will not be stored at home. NOTE: This sheet is a summary. It may not cover all possible information. If you have questions about this medicine, talk to your doctor, pharmacist, or health care provider.  2024 Elsevier/Gold Standard (2022-04-18 00:00:00)

## 2024-07-17 LAB — CEA: CEA: 4 ng/mL (ref 0.0–4.7)

## 2024-07-18 ENCOUNTER — Inpatient Hospital Stay

## 2024-07-18 VITALS — BP 110/82 | HR 95 | Temp 98.1°F | Resp 18

## 2024-07-18 DIAGNOSIS — C20 Malignant neoplasm of rectum: Secondary | ICD-10-CM

## 2024-07-18 MED ORDER — SODIUM CHLORIDE 0.9% FLUSH
10.0000 mL | INTRAVENOUS | Status: DC | PRN
  Administered 2024-07-18: 10 mL
  Filled 2024-07-18: qty 10

## 2024-07-18 MED ORDER — HEPARIN SOD (PORK) LOCK FLUSH 100 UNIT/ML IV SOLN
500.0000 [IU] | Freq: Once | INTRAVENOUS | Status: AC | PRN
  Administered 2024-07-18: 500 [IU]
  Filled 2024-07-18: qty 5

## 2024-07-22 ENCOUNTER — Encounter: Payer: Self-pay | Admitting: Oncology

## 2024-07-30 ENCOUNTER — Encounter: Payer: Self-pay | Admitting: Oncology

## 2024-07-30 ENCOUNTER — Inpatient Hospital Stay

## 2024-07-30 ENCOUNTER — Inpatient Hospital Stay: Attending: Oncology

## 2024-07-30 ENCOUNTER — Inpatient Hospital Stay (HOSPITAL_BASED_OUTPATIENT_CLINIC_OR_DEPARTMENT_OTHER): Admitting: Oncology

## 2024-07-30 ENCOUNTER — Encounter (HOSPITAL_COMMUNITY): Payer: Self-pay | Admitting: Oncology

## 2024-07-30 VITALS — BP 117/81 | HR 86 | Temp 96.0°F | Resp 18 | Wt 121.3 lb

## 2024-07-30 DIAGNOSIS — I129 Hypertensive chronic kidney disease with stage 1 through stage 4 chronic kidney disease, or unspecified chronic kidney disease: Secondary | ICD-10-CM | POA: Diagnosis not present

## 2024-07-30 DIAGNOSIS — Z803 Family history of malignant neoplasm of breast: Secondary | ICD-10-CM | POA: Insufficient documentation

## 2024-07-30 DIAGNOSIS — Z5111 Encounter for antineoplastic chemotherapy: Secondary | ICD-10-CM | POA: Insufficient documentation

## 2024-07-30 DIAGNOSIS — Z79899 Other long term (current) drug therapy: Secondary | ICD-10-CM | POA: Insufficient documentation

## 2024-07-30 DIAGNOSIS — N200 Calculus of kidney: Secondary | ICD-10-CM | POA: Insufficient documentation

## 2024-07-30 DIAGNOSIS — E876 Hypokalemia: Secondary | ICD-10-CM | POA: Insufficient documentation

## 2024-07-30 DIAGNOSIS — Z7984 Long term (current) use of oral hypoglycemic drugs: Secondary | ICD-10-CM | POA: Insufficient documentation

## 2024-07-30 DIAGNOSIS — F209 Schizophrenia, unspecified: Secondary | ICD-10-CM | POA: Insufficient documentation

## 2024-07-30 DIAGNOSIS — E785 Hyperlipidemia, unspecified: Secondary | ICD-10-CM | POA: Diagnosis not present

## 2024-07-30 DIAGNOSIS — I700 Atherosclerosis of aorta: Secondary | ICD-10-CM | POA: Diagnosis not present

## 2024-07-30 DIAGNOSIS — C20 Malignant neoplasm of rectum: Secondary | ICD-10-CM

## 2024-07-30 DIAGNOSIS — F319 Bipolar disorder, unspecified: Secondary | ICD-10-CM | POA: Insufficient documentation

## 2024-07-30 DIAGNOSIS — E1122 Type 2 diabetes mellitus with diabetic chronic kidney disease: Secondary | ICD-10-CM | POA: Diagnosis not present

## 2024-07-30 DIAGNOSIS — D122 Benign neoplasm of ascending colon: Secondary | ICD-10-CM | POA: Diagnosis not present

## 2024-07-30 DIAGNOSIS — C787 Secondary malignant neoplasm of liver and intrahepatic bile duct: Secondary | ICD-10-CM | POA: Diagnosis not present

## 2024-07-30 DIAGNOSIS — D696 Thrombocytopenia, unspecified: Secondary | ICD-10-CM | POA: Insufficient documentation

## 2024-07-30 DIAGNOSIS — T451X5A Adverse effect of antineoplastic and immunosuppressive drugs, initial encounter: Secondary | ICD-10-CM | POA: Insufficient documentation

## 2024-07-30 DIAGNOSIS — G4730 Sleep apnea, unspecified: Secondary | ICD-10-CM | POA: Diagnosis not present

## 2024-07-30 DIAGNOSIS — Z7952 Long term (current) use of systemic steroids: Secondary | ICD-10-CM | POA: Insufficient documentation

## 2024-07-30 DIAGNOSIS — J9601 Acute respiratory failure with hypoxia: Secondary | ICD-10-CM | POA: Diagnosis not present

## 2024-07-30 DIAGNOSIS — Z87442 Personal history of urinary calculi: Secondary | ICD-10-CM | POA: Insufficient documentation

## 2024-07-30 DIAGNOSIS — Z800 Family history of malignant neoplasm of digestive organs: Secondary | ICD-10-CM | POA: Insufficient documentation

## 2024-07-30 DIAGNOSIS — F17210 Nicotine dependence, cigarettes, uncomplicated: Secondary | ICD-10-CM | POA: Insufficient documentation

## 2024-07-30 DIAGNOSIS — Z923 Personal history of irradiation: Secondary | ICD-10-CM | POA: Insufficient documentation

## 2024-07-30 DIAGNOSIS — E039 Hypothyroidism, unspecified: Secondary | ICD-10-CM | POA: Diagnosis not present

## 2024-07-30 DIAGNOSIS — J441 Chronic obstructive pulmonary disease with (acute) exacerbation: Secondary | ICD-10-CM | POA: Insufficient documentation

## 2024-07-30 DIAGNOSIS — E1140 Type 2 diabetes mellitus with diabetic neuropathy, unspecified: Secondary | ICD-10-CM | POA: Insufficient documentation

## 2024-07-30 DIAGNOSIS — G620 Drug-induced polyneuropathy: Secondary | ICD-10-CM | POA: Insufficient documentation

## 2024-07-30 DIAGNOSIS — I2510 Atherosclerotic heart disease of native coronary artery without angina pectoris: Secondary | ICD-10-CM | POA: Diagnosis not present

## 2024-07-30 DIAGNOSIS — Z79890 Hormone replacement therapy (postmenopausal): Secondary | ICD-10-CM | POA: Insufficient documentation

## 2024-07-30 LAB — CMP (CANCER CENTER ONLY)
ALT: 17 U/L (ref 0–44)
AST: 27 U/L (ref 15–41)
Albumin: 3.7 g/dL (ref 3.5–5.0)
Alkaline Phosphatase: 73 U/L (ref 38–126)
Anion gap: 8 (ref 5–15)
BUN: 14 mg/dL (ref 6–20)
CO2: 24 mmol/L (ref 22–32)
Calcium: 9.1 mg/dL (ref 8.9–10.3)
Chloride: 106 mmol/L (ref 98–111)
Creatinine: 0.96 mg/dL (ref 0.44–1.00)
GFR, Estimated: >60 mL/min (ref >60)
Glucose, Bld: 140 mg/dL — ABNORMAL HIGH (ref 70–99)
Potassium: 3.8 mmol/L (ref 3.5–5.1)
Sodium: 138 mmol/L (ref 135–145)
Total Bilirubin: 0.5 mg/dL (ref 0.0–1.2)
Total Protein: 6.3 g/dL — ABNORMAL LOW (ref 6.5–8.1)

## 2024-07-30 LAB — CBC WITH DIFFERENTIAL (CANCER CENTER ONLY)
Abs Immature Granulocytes: 0.02 K/uL (ref 0.00–0.07)
Basophils Absolute: 0 K/uL (ref 0.0–0.1)
Basophils Relative: 1 %
Eosinophils Absolute: 0.2 K/uL (ref 0.0–0.5)
Eosinophils Relative: 3 %
HCT: 39.5 % (ref 36.0–46.0)
Hemoglobin: 13.2 g/dL (ref 12.0–15.0)
Immature Granulocytes: 0 %
Lymphocytes Relative: 13 %
Lymphs Abs: 0.8 K/uL (ref 0.7–4.0)
MCH: 33.9 pg (ref 26.0–34.0)
MCHC: 33.4 g/dL (ref 30.0–36.0)
MCV: 101.5 fL — ABNORMAL HIGH (ref 80.0–100.0)
Monocytes Absolute: 0.6 K/uL (ref 0.1–1.0)
Monocytes Relative: 10 %
Neutro Abs: 4.5 K/uL (ref 1.7–7.7)
Neutrophils Relative %: 73 %
Platelet Count: 117 K/uL — ABNORMAL LOW (ref 150–400)
RBC: 3.89 MIL/uL (ref 3.87–5.11)
RDW: 15.3 % (ref 11.5–15.5)
WBC Count: 6.1 K/uL (ref 4.0–10.5)
nRBC: 0 % (ref 0.0–0.2)

## 2024-07-30 LAB — SIGNATERA
SIGNATERA MTM READOUT: 0 MTM/ml
SIGNATERA TEST RESULT: NEGATIVE

## 2024-07-30 LAB — TOTAL PROTEIN, URINE DIPSTICK: Protein, ur: NEGATIVE mg/dL

## 2024-07-30 MED ORDER — SODIUM CHLORIDE 0.9 % IV SOLN
2400.0000 mg/m2 | INTRAVENOUS | Status: DC
  Administered 2024-07-30: 3850 mg via INTRAVENOUS
  Filled 2024-07-30: qty 77

## 2024-07-30 MED ORDER — SODIUM CHLORIDE 0.9 % IV SOLN
Freq: Once | INTRAVENOUS | Status: AC
  Filled 2024-07-30: qty 250

## 2024-07-30 MED ORDER — SODIUM CHLORIDE 0.9 % IV SOLN
5.0000 mg/kg | Freq: Once | INTRAVENOUS | Status: AC
  Administered 2024-07-30: 300 mg via INTRAVENOUS
  Filled 2024-07-30: qty 12

## 2024-07-30 MED ORDER — SODIUM CHLORIDE 0.9 % IV SOLN
400.0000 mg/m2 | Freq: Once | INTRAVENOUS | Status: AC
  Administered 2024-07-30: 644 mg via INTRAVENOUS
  Filled 2024-07-30: qty 32.2

## 2024-07-30 MED ORDER — DEXAMETHASONE SODIUM PHOSPHATE 10 MG/ML IJ SOLN
10.0000 mg | Freq: Once | INTRAMUSCULAR | Status: AC
  Administered 2024-07-30: 10 mg via INTRAVENOUS
  Filled 2024-07-30: qty 1

## 2024-07-30 MED ORDER — FLUOROURACIL CHEMO INJECTION 2.5 GM/50ML
400.0000 mg/m2 | Freq: Once | INTRAVENOUS | Status: AC
  Administered 2024-07-30: 650 mg via INTRAVENOUS
  Filled 2024-07-30: qty 13

## 2024-07-30 NOTE — Progress Notes (Signed)
 Hematology/Oncology Progress note [phone] [phone]      CHIEF COMPLAINTS/REASON FOR VISIT:  Follow-up for Stage IV  rectal cancer treatments.  ASSESSMENT & PLAN:   Cancer Staging  Rectal cancer [facility]) Staging form: Colon and Rectum, AJCC 8th Edition - Pathologic stage from 05/03/2022: Stage IIIB (pT3, pN1b, cM0) - Signed by Babara Call, MD on 05/03/2022 - Pathologic stage from 06/18/2023: Stage IVA (rpTX, rpN0, rpM1a) - Signed by Babara Call, MD on 06/18/2023   Rectal cancer [facility]) History of Stage III. pT3 pN1b cM0, s/p APR. 05/2023 Stage IV current rectal adenocarcinoma Currently on adjuvant chemotherapy. S/p FOLFOX x 4 cycles S/p concurrent chemotherapy [Xeloda  1300 mg BID] with RT, and then another 4 cycles of adjuvant FOLFOX [dose reduced oxaliplatin  and omit 5-FU bolus 05/2023 CT imaging indicates disease progression. liver biopsy pathology showed metastatic carcinoma--> 06/2023 FOLFIRI + Bevacizumab --> 10/2023 and 02/2024 CT partial response --> 05/2024 CT stable NGS showed TP53 missense variant, APC frameshift, TCF7L2 Frameshift, FLT3 copy number gain., TMB 4.7, pMMR Wildtype KRAS/NRAS  Labs are reviewed and discussed with patient. Shared decision was made to hold off irinotecan  given stable disease. .  Negative Signatera circulating tumor DNA Proceed with  5-Fu + Bevacizumab      No orders of the defined types were placed in this encounter.   Follow-up 2 week(s)  All questions were answered. The patient knows to call the clinic with any problems, questions or concerns.  Call Babara, MD, PhD [facility] Hematology Oncology 07/30/2024     HISTORY OF PRESENTING ILLNESS:   April Luna is a  53 y.o.  female presents for treatment of rectal cancer Oncology History  Rectal cancer (HCC)  02/26/2022 Imaging   MRI PELVIS WITHOUT CONTRAST- By imaging, rectal cancer stage:  T1/T2, N0, Mx    03/02/2022 Imaging   CT CHEST AND ABDOMEN WITH CONTRAST 1.  No convincing evidence of metastatic disease within the chest or abdomen. 2. Atrophic left kidney with multifocal renal scarring and cortical calcifications as well as nonobstructive renal stones measuring up to 5 mm. 3. Prominent left-sided predominant retroperitoneal lymph nodes measuring up to 8 mm near the level of the renal hilum, overall decreased in size dating back to CT September 19, 2018 and favored reactive related to left renal inflammation. 4.  Aortic Atherosclerosis (ICD10-I70.0).   03/27/2022 Genetic Testing    [employer] Custom[employer]+RNA cancer panel found no pathogenic mutations.    04/21/2022 Initial Diagnosis   Rectal cancer - baseline CEA 3.6 -02/09/2022, patient had colonoscopy which showed renal mass 10 cm from anal verge.  5 mm polyp in ascending colon.  Removed and retrieved. Pathology showed rectal adenocarcinoma.  The polyp in the ascending colon is a tubular adenoma.  MRI showed cT1/T2N0 disease  -04/21/2022, patient underwent robotic assisted ultralow anterior resection. Pathology showed moderately differentiated adenocarcinoma, 4.5 cm in maximal extent, with focal extension through muscularis propria into perirectal soft tissue.  3 lymph nodes positive for metastatic carcinoma.  Negative margin.  pT3 pN1b, MSI stable.   05/03/2022 Cancer Staging   Staging form: Colon and Rectum, AJCC 8th Edition - Pathologic stage from 05/03/2022: Stage IIIB (pT3, pN1b, cM0) - Signed by Babara Call, MD on 05/03/2022 Stage prefix: Initial diagnosis   05/11/2022 Miscellaneous   Medi port placed by Dr.White   05/26/2022 -  Chemotherapy   FOLFOX Q2 weeks x 4   05/26/2022 - 07/09/2022 Chemotherapy   Patient is on Treatment Plan : COLORECTAL FOLFOX q14d x 8 cycles  05/26/2022 - 12/13/2022 Chemotherapy   Patient is on Treatment Plan : COLORECTAL FOLFOX q14d x 4 months     07/27/2022 -  Chemotherapy   Concurrent chemotherapy- Xeloda   1300mg  BID and radiation.    07/27/2022 - 09/12/2022 Radiation  Therapy    concurrent chemotherapy [Xeloda  1300 mg BID] with RT    09/20/2022 Imaging   CT Angiogram chest PET protocol There is no evidence of pulmonary artery embolism. There is no evidence of thoracic aortic dissection.   Small linear patchy alveolar infiltrate is seen in medial segment of right middle lobe suggesting atelectasis/pneumonia.     09/28/2022 - 09/30/2022 Hospital Admission   Admission due to acute respiratory failure with hypoxia, COPD exacerbation   09/28/2022 Imaging   CT angio chest PE protocol 1. Negative for acute PE or thoracic aortic dissection. 2. New ground-glass infiltrates anteriorly in bilateral upper lobes,may represent atypical edema, infectious or inflammatory process. 3.  Aortic Atherosclerosis     12/22/2022 Imaging   CT chest abdomen pelvis w contrast  Stable exam. No evidence of recurrent or metastatic carcinoma within the chest, abdomen, or pelvis.   Left nephrolithiasis and renal parenchymal atrophy. No evidence of ureteral calculi or hydronephrosis.   Aortic Atherosclerosis   05/31/2023 Imaging   CT chest abdomen pelvis with contrast showed 1. New hypodense 16 mm lesion in the right lobe of the liver with very subtle adjacent 5 mm lesion, suspicious for metastatic disease. 2. Small bilateral pulmonary nodules, some of which were subtly evident on prior examination only in retrospect, some of which demonstrate degree of cavitation, suspicious for metastatic disease. 3. Prominent retroperitoneal lymph nodes are similar prior. 4. Prior low anterior resection with Hartmann's pouch formation,similar small amount of perirectal/presacral soft tissue and fluid,favored postsurgical/posttreatment change. Suggest continued attention on follow-up imaging. 5. Large volume of formed stool in the colon. 6. Nonobstructive left renal calculi measure under 1 cm.     06/08/2023 Relapse/Recurrence   Ultrasound-guided liver biopsy showed Pathology showed  metastatic carcinoma, morphology consistent with patient's clinical history of rectal adenocarcinoma.  [employer] NGS 648 gene panel showed TP53 missense variant, APC frameshift, TCF7L2 frameshift, FLT3 copy number gain, TMB 4.46m/MB MSI stable.  Wildtype KRAS/NRAS  [employer] RNA Seq - ERBB3 overexpressed, VEGFA overexpressed, NRAS overexpressed.     06/18/2023 Cancer Staging   Staging form: Colon and Rectum, AJCC 8th Edition - Pathologic stage from 06/18/2023: Stage IVA (rpTX, pN0, pM1a) - Signed by Babara Call, MD on 06/18/2023 Stage prefix: Recurrence Total positive nodes: 0   07/02/2023 - 06/04/2024 Chemotherapy   COLORECTAL FOLFIRI + Bevacizumab  q14d      11/13/2023 Imaging   CT chest abdomen pelvis w contrast showed  1. Increase in cavitation with decreased soft tissue involving the scattered bilateral pulmonary nodules, consistent with treatment response. No new suspicious pulmonary nodules or masses. 2. Decrease in size and conspicuity of the hypodense lesions in the right lobe of the liver, consistent with treatment response. No new suspicious hepatic lesion identified. 3. Stable prominent nonspecific retroperitoneal lymph nodes. Suggest continued attention on follow-up imaging. 4. Similar surgical changes of prior low anterior resection with left anterior abdominal wall colostomy. Similar mesorectal/presacral soft tissue, no new suspicious enhancing nodularity. 5. Similar soft tissue nodularity in the bilateral posterior gluteal subcutaneous soft tissues. 6. Moderate volume of formed stool in the colon. Correlate for constipation.   03/03/2024 Imaging   CT chest abdomen pelvis w contrast showed 1. 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 2.  No new or progressive disease. 3. Similar prominent but not pathologically sized abdominal retroperitoneal nodes, nonspecific. 4. Status post low anterior resection with descending colostomy. Possible constipation. 5. Age  advanced coronary artery atherosclerosis. Recommend assessment of coronary risk factors. 6.  Aortic Atherosclerosis (ICD10-I70.0). 7. Left nephrolithiasis and renal scarring.    05/27/2024 Imaging   CT chest abdomen pelvis w contrast showed 1. Status post abdominoperineal resection with left lower quadrant end colostomy. 2. Unchanged enlarged retroperitoneal lymph nodes. 3. Unchanged small hypodense lesion of the posterior right lobe of the liver. 4. Very tiny, irregular cavitary nodules are unchanged. 5. New areas of irregular atelectasis or consolidation of the peripheral right lower lobe, nonspecific and most likely infectious or inflammatory. Metastatic disease distinctly not favored. Attention on follow-up. 6. Atrophic, scarred left kidney with nonobstructive calculi of the inferior pole. No hydronephrosis.   Aortic Atherosclerosis (ICD10-I70.0).     06/18/2024 -  Chemotherapy   5-FU and Bevacizumab  maintenance.    Patient has bipolar and schizophrenia..  Patient is married  She has housing issue due to previous history of eviction.  she does not live with her husband currently. She lives with some family members.   + chronic dysuria and urgency she was seen by Urology and was started on Gemteza.  Symptoms are improved.    INTERVAL HISTORY April Luna is a 53 y.o. female who has above history reviewed by me today presents for follow up visit for rectal cancer.   Denies any melena or blood in the stool. Stable neuropathy symptoms. + loose BM, stable baseline. She feels extremely fatigued for 3 days after treatments. Today feels well.  +good appetite, weight is stable.    Review of Systems  Constitutional:  Positive for fatigue. Negative for appetite change, chills and fever.  HENT:   Negative for hearing loss, mouth sores and voice change.   Eyes:  Negative for eye problems.  Respiratory:  Negative for chest tightness, cough and shortness of breath.    Cardiovascular:  Negative for chest pain.  Gastrointestinal:  Positive for diarrhea. Negative for abdominal distention, abdominal pain, blood in stool and nausea.       + colostomy   Endocrine: Negative for hot flashes.  Genitourinary:  Negative for difficulty urinating, dysuria and frequency.   Musculoskeletal:  Negative for arthralgias.  Skin:  Negative for itching and rash.  Neurological:  Positive for numbness. Negative for extremity weakness.  Hematological:  Negative for adenopathy.  Psychiatric/Behavioral:  Negative for confusion.     MEDICAL HISTORY:  Past Medical History:  Diagnosis Date   Allergy    pollen   Anxiety    Arthritis    right hip   Bipolar 1 disorder (HCC)    Cancer (HCC)    rectal   Chemotherapy-induced neuropathy (HCC) 10/24/2022   Chronic kidney disease    COPD (chronic obstructive pulmonary disease) (HCC)    Depression    Family history of adverse reaction to anesthesia    grand father had a stroke during anesthesia   Family history of breast cancer    Family history of colon cancer    Family history of uterine cancer    GERD (gastroesophageal reflux disease)    History of kidney stones    Hyperlipidemia    Hypertension    Hypothyroidism    Panic attack    Pneumonia    Psoriasis    Sleep apnea 08/11/2021   No CPAP   Type 2 diabetes mellitus with microalbuminuria,  without long-term current use of insulin  (HCC) 06/24/2019    SURGICAL HISTORY: Past Surgical History:  Procedure Laterality Date   BREAST BIOPSY Left 12/14/2021   us  bx, venus marker, path pending   CESAREAN SECTION     COLONOSCOPY WITH PROPOFOL  N/A 02/09/2022   Procedure: COLONOSCOPY WITH PROPOFOL ;  Surgeon: Unk Corinn Skiff, MD;  Location: [facility];  Service: Endoscopy;  Laterality: N/A;  sleep apnea   CYSTOSCOPY W/ RETROGRADES Left 11/08/2018   Procedure: CYSTOSCOPY WITH RETROGRADE PYELOGRAM;  Surgeon: Francisca Redell BROCKS, MD;  Location: [facility];  Service:  Urology;  Laterality: Left;   CYSTOSCOPY/URETEROSCOPY/HOLMIUM LASER/STENT PLACEMENT Left 11/08/2018   Procedure: CYSTOSCOPY/URETEROSCOPY/HOLMIUM LASER/STENT PLACEMENT;  Surgeon: Francisca Redell BROCKS, MD;  Location: [facility];  Service: Urology;  Laterality: Left;   IR CV LINE INJECTION  06/25/2023   IR IMAGING GUIDED PORT INSERTION  05/11/2022   IR REMOVE CV FIBRIN SHEATH  06/27/2023   MOUTH SURGERY     wisdom teeth extraction   MOUTH SURGERY     teeth removal   POLYPECTOMY N/A 02/09/2022   Procedure: POLYPECTOMY;  Surgeon: Unk Corinn Skiff, MD;  Location: [facility];  Service: Endoscopy;  Laterality: N/A;   XI ROBOTIC ASSISTED LOWER ANTERIOR RESECTION N/A 04/21/2022   Procedure: XI ROBOTIC ASSISTED LOWER ANTERIOR RESECTION WITH COLOSTOMY, BILATERAL TAP BLOCK, ASSESSMENT OF TISSUE PERFUSSION WITH FIREFLY INJECTION;  Surgeon: Teresa Lonni HERO, MD;  Location: [facility];  Service: General;  Laterality: N/A;    SOCIAL HISTORY: Social History   Socioeconomic History   Marital status: Married    Spouse name: Morene Jama Pereyra   Number of children: 1   Years of education: 12   Highest education level: High school graduate  Occupational History   Occupation: unemployed    Comment: disabled  Tobacco Use   Smoking status: Some Days    Current packs/day: 0.25    Average packs/day: 0.3 packs/day for 39.2 years (9.8 ttl pk-yrs)    Types: Cigarettes    Start date: 05/13/1985    Passive exposure: Current   Smokeless tobacco: Never   Tobacco comments:    5-6 cigarettes weekly- 11/07/2022  Vaping Use   Vaping status: Former   Start date: 05/25/2018   Quit date: 09/24/2018  Substance and Sexual Activity   Alcohol use: Not Currently    Alcohol/week: 4.0 standard drinks of alcohol    Types: 4 Glasses of wine per week    Comment: quit ETOH in Feb. 2023   Drug use: Yes    Types: Marijuana    Comment: 2 days ago   Sexual activity: Not Currently    Birth control/protection: Post-menopausal   Other Topics Concern   Not on file  Social History Narrative   Lives with husband and son    Social Drivers of Health   Financial Resource Strain: High Risk (08/30/2023)   Overall Financial Resource Strain (CARDIA)    Difficulty of Paying Living Expenses: Hard  Food Insecurity: Food Insecurity Present (06/03/2024)   Hunger Vital Sign    Worried About Running Out of Food in the Last Year: Sometimes true    Ran Out of Food in the Last Year: Sometimes true  Transportation Needs: No Transportation Needs (08/30/2023)   PRAPARE - Administrator, Civil Service (Medical): No    Lack of Transportation (Non-Medical): No  Physical Activity: Inactive (08/30/2023)   Exercise Vital Sign    Days of Exercise per Week: 0 days    Minutes  of Exercise per Session: 0 min  Stress: Stress Concern Present (08/30/2023)   [employer] of Occupational Health - Occupational Stress Questionnaire    Feeling of Stress : Very much  Social Connections: Moderately Integrated (08/30/2023)   Social Connection and Isolation Panel    Frequency of Communication with Friends and Family: Never    Frequency of Social Gatherings with Friends and Family: Once a week    Attends Religious Services: More than 4 times per year    Active Member of [employer] or Organizations: No    Attends Engineer, structural: 1 to 4 times per year    Marital Status: Married  Catering manager Violence: Not At Risk (08/30/2023)   Humiliation, Afraid, Rape, and Kick questionnaire    Fear of Current or Ex-Partner: No    Emotionally Abused: No    Physically Abused: No    Sexually Abused: No    FAMILY HISTORY: Family History  Problem Relation Age of Onset   Depression Mother    Anxiety disorder Mother    Diabetes Mother    Hypertension Mother    Hyperlipidemia Mother    Cancer Mother    Uterine cancer Mother 52   Cervical cancer Mother 68   Colon cancer Father    Depression Brother    Anxiety disorder Brother    Cancer  Maternal Aunt        unk types   Diabetes Mellitus II Maternal Grandmother    Hypercholesterolemia Maternal Grandmother    Breast cancer Maternal Grandmother    Cancer Paternal Grandmother    Diabetes Paternal Grandmother    Melanoma Paternal Grandmother    Stomach cancer Paternal Grandmother     ALLERGIES:  is allergic to metformin and related, nsaids, perphenazine, sulfa antibiotics, abilify [aripiprazole], and penicillins.  MEDICATIONS:  Current Outpatient Medications  Medication Sig Dispense Refill   acetaminophen  (TYLENOL ) 325 MG tablet Take 650 mg by mouth every 6 (six) hours as needed for headache (pain).     albuterol  (VENTOLIN  HFA) 108 (90 Base) MCG/ACT inhaler Inhale 2 puffs into the lungs every 6 (six) hours as needed for wheezing or shortness of breath. 1 each 5   amantadine  (SYMMETREL ) 100 MG capsule Take 100 mg by mouth 2 (two) times daily.     blood glucose meter kit and supplies Dispense based on patient and insurance preference. Use up to four times daily as directed. (FOR ICD-10 E10.9, E11.9). 1 each 0   Budeson-Glycopyrrol-Formoterol  (BREZTRI  AEROSPHERE) 160-9-4.8 MCG/ACT AERO Inhale 2 puffs into the lungs in the morning and at bedtime. 10.7 g 2   buPROPion  (WELLBUTRIN  XL) 300 MG 24 hr tablet Take 300 mg by mouth daily.     Cholecalciferol  (VITAMIN D -3) 125 MCG (5000 UT) TABS Take 5,000 Units by mouth daily. 30 tablet 1   clonazePAM (KLONOPIN) 0.5 MG tablet Take 1 mg by mouth 2 (two) times daily as needed.     dexamethasone  (DECADRON ) 4 MG tablet Take 2 tablets (8 mg total) by mouth See admin instructions. Take 8mg  daily for 2 days after each chemotherapy. 30 tablet 1   diltiazem  (CARDIZEM  CD) 120 MG 24 hr capsule TAKE 1 CAPSULE BY MOUTH DAILY 90 capsule 1   docusate sodium  (COLACE) 100 MG capsule TAKE 1 CAPSULE BY MOUTH TWICE DAILY 60 capsule 2   FARXIGA  10 MG TABS tablet TAKE 1 TABLET BY MOUTH DAILY BEFORE BREAKFAST 90 tablet 0   gabapentin  (NEURONTIN ) 300 MG capsule  Take 300 mg by mouth 2 (  two) times daily.     icosapent  Ethyl (VASCEPA ) 1 g capsule Take 2 capsules (2 g total) by mouth in the morning and at bedtime. 120 capsule 2   levothyroxine  (SYNTHROID ) 75 MCG tablet TAKE 1 TABLET BY MOUTH DAILY BEFORE BREAKFAST 90 tablet 2   lidocaine -prilocaine  (EMLA ) cream Apply to affected area once 30 g 3   loperamide  (IMODIUM ) 2 MG capsule Take 1 capsule (2 mg total) by mouth See admin instructions. Initial: 4 mg,the 2 mg every 2 hours (4 mg every 4 hours at night)  maximum: 16 mg/day 120 capsule 2   loratadine  (CLARITIN ) 10 MG tablet Take 10 mg by mouth every morning.     magic mouthwash w/lidocaine  SOLN Take 5 mLs by mouth 4 (four) times daily as needed for mouth pain. Sig: Swish/Swallow 5-10 ml four times a day as needed. Dispense 480 ml. 1RF 480 mL 1   ondansetron  (ZOFRAN ) 8 MG tablet Take 1 tablet (8 mg total) by mouth every 8 (eight) hours as needed for nausea, vomiting or refractory nausea / vomiting. Start on the third day after chemotherapy. 30 tablet 1   paliperidone  (INVEGA  SUSTENNA) 234 MG/1.5ML SUSY injection Inject 234 mg into the muscle every 30 (thirty) days. On or about the 14th of each month     pantoprazole  (PROTONIX ) 40 MG tablet TAKE 1 TABLET BY MOUTH DAILY 90 tablet 2   pioglitazone  (ACTOS ) 15 MG tablet Take 1 tablet (15 mg total) by mouth daily. 90 tablet 1   potassium chloride  SA (KLOR-CON  M) 20 MEQ tablet Take 1 tablet (20 mEq total) by mouth daily. 30 tablet 0   promethazine  (PHENERGAN ) 12.5 MG tablet Take 1 tablet (12.5 mg total) by mouth every 6 (six) hours as needed for nausea or vomiting. 30 tablet 0   rosuvastatin  (CRESTOR ) 40 MG tablet TAKE 1 TABLET BY MOUTH EVERY MORNING 90 tablet 0   sertraline  (ZOLOFT ) 100 MG tablet Take 200 mg by mouth every morning.     Spacer/Aero-Holding Chambers (AEROCHAMBER MV) inhaler Use as instructed 1 each 0   Vibegron  (GEMTESA ) 75 MG TABS Take 1 tablet (75 mg total) by mouth daily.     No current  facility-administered medications for this visit.   Facility-Administered Medications Ordered in Other Visits  Medication Dose Route Frequency Provider Last Rate Last Admin   albuterol  (PROVENTIL ) (2.5 MG/3ML) 0.083% nebulizer solution 2.5 mg  2.5 mg Nebulization Once Dgayli, Khabib, MD       fluorouracil  (ADRUCIL ) 3,850 mg in sodium chloride  0.9 % 73 mL chemo infusion  2,400 mg/m2 (Treatment Plan Recorded) Intravenous 1 day or 1 dose Babara Call, MD   Infusion Verify at 07/30/24 1125   sodium chloride  flush (NS) 0.9 % injection 10 mL  10 mL Intracatheter PRN Babara Call, MD   10 mL at 08/15/23 1327   sodium chloride  flush (NS) 0.9 % injection 10 mL  10 mL Intracatheter PRN Babara Call, MD   10 mL at 03/26/24 1255     PHYSICAL EXAMINATION: ECOG PERFORMANCE STATUS: 0 - Asymptomatic Vitals:   07/30/24 0842  BP: 117/81  Pulse: 86  Resp: 18  Temp: (!) 96 F (35.6 C)  SpO2: 97%     Filed Weights   07/30/24 0842  Weight: 121 lb 4.8 oz (55 kg)      Physical Exam Constitutional:      General: She is not in acute distress. HENT:     Head: Normocephalic and atraumatic.  Eyes:  General: No scleral icterus. Cardiovascular:     Rate and Rhythm: Normal rate and regular rhythm.  Pulmonary:     Effort: Pulmonary effort is normal. No respiratory distress.     Breath sounds: No wheezing.     Comments: Decreased breath sound bilaterally Abdominal:     General: Bowel sounds are normal. There is no distension.     Palpations: Abdomen is soft.     Comments: +colostomy   Musculoskeletal:        General: No deformity. Normal range of motion.     Cervical back: Normal range of motion and neck supple.  Skin:    General: Skin is warm and dry.     Findings: No erythema or rash.  Neurological:     Mental Status: She is alert and oriented to person, place, and time. Mental status is at baseline.     Cranial Nerves: No cranial nerve deficit.     Coordination: Coordination normal.  Psychiatric:         Mood and Affect: Mood normal.     LABORATORY DATA:  I have reviewed the data as listed    Latest Ref Rng & Units 07/30/2024    8:33 AM 07/16/2024    8:24 AM 07/02/2024   11:29 AM  CBC  WBC 4.0 - 10.5 K/uL 6.1  8.9  4.9   Hemoglobin 12.0 - 15.0 g/dL 86.7  86.7  86.4   Hematocrit 36.0 - 46.0 % 39.5  38.9  39.6   Platelets 150 - 400 K/uL 117  105  135       Latest Ref Rng & Units 07/30/2024    8:33 AM 07/16/2024    8:24 AM 07/02/2024   11:29 AM  CMP  Glucose 70 - 99 mg/dL 859  838  858   BUN 6 - 20 mg/dL 14  8  16    Creatinine 0.44 - 1.00 mg/dL 9.03  8.99  9.02   Sodium 135 - 145 mmol/L 138  137  135   Potassium 3.5 - 5.1 mmol/L 3.8  3.5  4.3   Chloride 98 - 111 mmol/L 106  106  103   CO2 22 - 32 mmol/L 24  24  24    Calcium  8.9 - 10.3 mg/dL 9.1  9.2  9.0   Total Protein 6.5 - 8.1 g/dL 6.3  6.4  6.7   Total Bilirubin 0.0 - 1.2 mg/dL 0.5  0.4  0.4   Alkaline Phos 38 - 126 U/L 73  82  72   AST 15 - 41 U/L 27  22  20    ALT 0 - 44 U/L 17  15  12          RADIOGRAPHIC STUDIES: I have personally reviewed the radiological images as listed and agreed with the findings in the report. No results found.

## 2024-07-30 NOTE — Patient Instructions (Signed)
 [facility] - A DEPT OF [facility]  Discharge Instructions: Thank you for choosing [facility] to provide your oncology and hematology care.  If you have a lab appointment with the Cancer Center, please go directly to the Cancer Center and check in at the registration area.  Wear comfortable clothing and clothing appropriate for easy access to any Portacath or PICC line.   We strive to give you quality time with your provider. You may need to reschedule your appointment if you arrive late (15 or more minutes).  Arriving late affects you and other patients whose appointments are after yours.  Also, if you miss three or more appointments without notifying the office, you may be dismissed from the clinic at the provider's discretion.      For prescription refill requests, have your pharmacy contact our office and allow 72 hours for refills to be completed.    Today you received the following chemotherapy and/or immunotherapy agents MVASI , Leucovorin , Adrucil       To help prevent nausea and vomiting after your treatment, we encourage you to take your nausea medication as directed.  BELOW ARE SYMPTOMS THAT SHOULD BE REPORTED IMMEDIATELY: *FEVER GREATER THAN 100.4 F (38 C) OR HIGHER *CHILLS OR SWEATING *NAUSEA AND VOMITING THAT IS NOT CONTROLLED WITH YOUR NAUSEA MEDICATION *UNUSUAL SHORTNESS OF BREATH *UNUSUAL BRUISING OR BLEEDING *URINARY PROBLEMS (pain or burning when urinating, or frequent urination) *BOWEL PROBLEMS (unusual diarrhea, constipation, pain near the anus) TENDERNESS IN MOUTH AND THROAT WITH OR WITHOUT PRESENCE OF ULCERS (sore throat, sores in mouth, or a toothache) UNUSUAL RASH, SWELLING OR PAIN  UNUSUAL VAGINAL DISCHARGE OR ITCHING   Items with * indicate a potential emergency and should be followed up as soon as possible or go to the Emergency Department if any problems should occur.  Please show the CHEMOTHERAPY ALERT CARD or  IMMUNOTHERAPY ALERT CARD at check-in to the Emergency Department and triage nurse.  Should you have questions after your visit or need to cancel or reschedule your appointment, please contact [facility] - A DEPT OF [facility]  [phone] and follow the prompts.  Office hours are 8:00 a.m. to 4:30 p.m. Monday - Friday. Please note that voicemails left after 4:00 p.m. may not be returned until the following business day.  We are closed weekends and major holidays. You have access to a nurse at all times for urgent questions. Please call the main number to the clinic [phone] and follow the prompts.  For any non-urgent questions, you may also contact your provider using MyChart. We now offer e-Visits for anyone 66 and older to request care online for non-urgent symptoms. For details visit [email or web address].   Also download the MyChart app! Go to the app store, search MyChart, open the app, select [facility], and log in with your MyChart username and password.

## 2024-07-30 NOTE — Assessment & Plan Note (Addendum)
 History of Stage III. pT3 pN1b cM0, s/p APR. 05/2023 Stage IV current rectal adenocarcinoma Currently on adjuvant chemotherapy. S/p FOLFOX x 4 cycles S/p concurrent chemotherapy [Xeloda  1300 mg BID] with RT, and then another 4 cycles of adjuvant FOLFOX [dose reduced oxaliplatin  and omit 5-FU bolus 05/2023 CT imaging indicates disease progression. liver biopsy pathology showed metastatic carcinoma--> 06/2023 FOLFIRI + Bevacizumab --> 10/2023 and 02/2024 CT partial response --> 05/2024 CT stable NGS showed TP53 missense variant, APC frameshift, TCF7L2 Frameshift, FLT3 copy number gain., TMB 4.7, pMMR Wildtype KRAS/NRAS  Labs are reviewed and discussed with patient. Shared decision was made to hold off irinotecan  given stable disease. .  Negative Signatera circulating tumor DNA Proceed with  5-Fu + Bevacizumab 

## 2024-07-31 LAB — CEA: CEA: 4 ng/mL (ref 0.0–4.7)

## 2024-08-01 ENCOUNTER — Inpatient Hospital Stay

## 2024-08-13 ENCOUNTER — Inpatient Hospital Stay

## 2024-08-13 ENCOUNTER — Encounter: Payer: Self-pay | Admitting: Oncology

## 2024-08-13 ENCOUNTER — Inpatient Hospital Stay (HOSPITAL_BASED_OUTPATIENT_CLINIC_OR_DEPARTMENT_OTHER): Admitting: Oncology

## 2024-08-13 ENCOUNTER — Other Ambulatory Visit: Payer: Self-pay

## 2024-08-13 VITALS — BP 130/88 | HR 83 | Temp 97.8°F | Resp 18 | Ht 59.0 in | Wt 119.0 lb

## 2024-08-13 DIAGNOSIS — E876 Hypokalemia: Secondary | ICD-10-CM

## 2024-08-13 DIAGNOSIS — R300 Dysuria: Secondary | ICD-10-CM

## 2024-08-13 DIAGNOSIS — T451X5A Adverse effect of antineoplastic and immunosuppressive drugs, initial encounter: Secondary | ICD-10-CM

## 2024-08-13 DIAGNOSIS — G620 Drug-induced polyneuropathy: Secondary | ICD-10-CM

## 2024-08-13 DIAGNOSIS — C20 Malignant neoplasm of rectum: Secondary | ICD-10-CM

## 2024-08-13 DIAGNOSIS — D696 Thrombocytopenia, unspecified: Secondary | ICD-10-CM

## 2024-08-13 LAB — CBC WITH DIFFERENTIAL (CANCER CENTER ONLY)
Abs Immature Granulocytes: 0.02 K/uL (ref 0.00–0.07)
Basophils Absolute: 0 K/uL (ref 0.0–0.1)
Basophils Relative: 1 %
Eosinophils Absolute: 0.1 K/uL (ref 0.0–0.5)
Eosinophils Relative: 2 %
HCT: 38.6 % (ref 36.0–46.0)
Hemoglobin: 13.2 g/dL (ref 12.0–15.0)
Immature Granulocytes: 0 %
Lymphocytes Relative: 16 %
Lymphs Abs: 0.8 K/uL (ref 0.7–4.0)
MCH: 34.1 pg — ABNORMAL HIGH (ref 26.0–34.0)
MCHC: 34.2 g/dL (ref 30.0–36.0)
MCV: 99.7 fL (ref 80.0–100.0)
Monocytes Absolute: 0.6 K/uL (ref 0.1–1.0)
Monocytes Relative: 11 %
Neutro Abs: 3.5 K/uL (ref 1.7–7.7)
Neutrophils Relative %: 70 %
Platelet Count: 95 K/uL — ABNORMAL LOW (ref 150–400)
RBC: 3.87 MIL/uL (ref 3.87–5.11)
RDW: 15.9 % — ABNORMAL HIGH (ref 11.5–15.5)
WBC Count: 5 K/uL (ref 4.0–10.5)
nRBC: 0 % (ref 0.0–0.2)

## 2024-08-13 LAB — URINALYSIS, COMPLETE (UACMP) WITH MICROSCOPIC
Bacteria, UA: NONE SEEN
Bilirubin Urine: NEGATIVE
Glucose, UA: NEGATIVE mg/dL
Ketones, ur: 5 mg/dL — AB
Nitrite: NEGATIVE
Protein, ur: 30 mg/dL — AB
Specific Gravity, Urine: 1.017 (ref 1.005–1.030)
pH: 5 (ref 5.0–8.0)

## 2024-08-13 LAB — CMP (CANCER CENTER ONLY)
ALT: 11 U/L (ref 0–44)
AST: 19 U/L (ref 15–41)
Albumin: 3.7 g/dL (ref 3.5–5.0)
Alkaline Phosphatase: 74 U/L (ref 38–126)
Anion gap: 8 (ref 5–15)
BUN: 9 mg/dL (ref 6–20)
CO2: 23 mmol/L (ref 22–32)
Calcium: 9 mg/dL (ref 8.9–10.3)
Chloride: 105 mmol/L (ref 98–111)
Creatinine: 0.82 mg/dL (ref 0.44–1.00)
GFR, Estimated: >60 mL/min (ref >60)
Glucose, Bld: 157 mg/dL — ABNORMAL HIGH (ref 70–99)
Potassium: 3.4 mmol/L — ABNORMAL LOW (ref 3.5–5.1)
Sodium: 136 mmol/L (ref 135–145)
Total Bilirubin: 0.6 mg/dL (ref 0.0–1.2)
Total Protein: 6.4 g/dL — ABNORMAL LOW (ref 6.5–8.1)

## 2024-08-13 LAB — TOTAL PROTEIN, URINE DIPSTICK: Protein, ur: 30 mg/dL — AB

## 2024-08-13 NOTE — Assessment & Plan Note (Signed)
Obtain UA and urine culture

## 2024-08-13 NOTE — Progress Notes (Signed)
 Hematology/Oncology Progress note [phone] Fax:([phone]) [phone]      CHIEF COMPLAINTS/REASON FOR VISIT:  Follow-up for Stage IV  rectal cancer treatments.  ASSESSMENT & PLAN:   Cancer Staging  Rectal cancer [facility]) Staging form: Colon and Rectum, AJCC 8th Edition - Pathologic stage from 05/03/2022: Stage IIIB (pT3, pN1b, cM0) - Signed by Babara Call, MD on 05/03/2022 - Pathologic stage from 06/18/2023: Stage IVA (rpTX, rpN0, rpM1a) - Signed by Babara Call, MD on 06/18/2023   Rectal cancer [facility]) History of Stage III. pT3 pN1b cM0, s/p APR. 05/2023 Stage IV current rectal adenocarcinoma Currently on adjuvant chemotherapy. S/p FOLFOX x 4 cycles S/p concurrent chemotherapy [Xeloda  1300 mg BID] with RT, and then another 4 cycles of adjuvant FOLFOX [dose reduced oxaliplatin  and omit 5-FU bolus 05/2023 CT imaging indicates disease progression. liver biopsy pathology showed metastatic carcinoma--> 06/2023 FOLFIRI + Bevacizumab --> 10/2023 and 02/2024 CT partial response --> 05/2024 CT stable NGS showed TP53 missense variant, APC frameshift, TCF7L2 Frameshift, FLT3 copy number gain., TMB 4.7, pMMR Wildtype KRAS/NRAS  Labs are reviewed and discussed with patient. Shared decision was made to hold off irinotecan  given stable disease. .  Negative Signatera circulating tumor DNA Hold   5-Fu + Bevacizumab      Chemotherapy-induced neuropathy (HCC) Grade 2  Continue gabapentin  300mg  BID.   Hypokalemia Stable K level. Continue oral KCL 20meq daily.  Dysuria Obtain UA and urine culture  Thrombocytopenia (HCC) Chemotherapy induced Trending low, close monitor.   Orders Placed This Encounter  Procedures   Urine Culture    Standing Status:   Future    Number of Occurrences:   1    Expected Date:   08/13/2024    Expiration Date:   11/11/2024   CT CHEST ABDOMEN PELVIS W CONTRAST    Standing Status:   Future    Expected Date:   09/03/2024    Expiration Date:   08/13/2025    If indicated  for the ordered procedure, I authorize the administration of contrast media per Radiology protocol:   Yes    Does the patient have a contrast media/X-ray dye allergy?:   No    Is patient pregnant?:   No    Preferred imaging location?:   [facility]    If indicated for the ordered procedure, I authorize the administration of oral contrast media per Radiology protocol:   Yes   Total Protein, Urine dipstick    Standing Status:   Future    Expected Date:   08/27/2024    Expiration Date:   08/27/2025   CEA    Standing Status:   Future    Expected Date:   08/27/2024    Expiration Date:   08/27/2025   CBC with Differential (Cancer Center Only)    Standing Status:   Future    Expected Date:   08/27/2024    Expiration Date:   08/27/2025   CMP (Cancer Center only)    Standing Status:   Future    Expected Date:   08/27/2024    Expiration Date:   08/27/2025   Total Protein, Urine dipstick    Standing Status:   Future    Expected Date:   09/10/2024    Expiration Date:   09/10/2025   CEA    Standing Status:   Future    Expected Date:   09/10/2024    Expiration Date:   09/10/2025   CBC with Differential (Cancer Center Only)    Standing Status:   Future  Expected Date:   09/10/2024    Expiration Date:   09/10/2025   CMP (Cancer Center only)    Standing Status:   Future    Expected Date:   09/10/2024    Expiration Date:   09/10/2025   Urinalysis, Complete w Microscopic    Standing Status:   Future    Number of Occurrences:   1    Expected Date:   08/13/2024    Expiration Date:   11/11/2024    Follow-up 2 week(s)  All questions were answered. The patient knows to call the clinic with any problems, questions or concerns.  Luna Cap, MD, PhD [facility] Hematology Oncology 08/13/2024     HISTORY OF PRESENTING ILLNESS:   April Luna is a  53 y.o.  female presents for treatment of rectal cancer Oncology History  Rectal cancer (HCC)  02/26/2022 Imaging   MRI PELVIS WITHOUT CONTRAST- By  imaging, rectal cancer stage:  T1/T2, N0, Mx    03/02/2022 Imaging   CT CHEST AND ABDOMEN WITH CONTRAST 1. No convincing evidence of metastatic disease within the chest or abdomen. 2. Atrophic left kidney with multifocal renal scarring and cortical calcifications as well as nonobstructive renal stones measuring up to 5 mm. 3. Prominent left-sided predominant retroperitoneal lymph nodes measuring up to 8 mm near the level of the renal hilum, overall decreased in size dating back to CT September 19, 2018 and favored reactive related to left renal inflammation. 4.  Aortic Atherosclerosis (ICD10-I70.0).   03/27/2022 Genetic Testing    [facility] Custom[employer]+RNA cancer panel found no pathogenic mutations.    04/21/2022 Initial Diagnosis   Rectal cancer - baseline CEA 3.6 -02/09/2022, patient had colonoscopy which showed renal mass 10 cm from anal verge.  5 mm polyp in ascending colon.  Removed and retrieved. Pathology showed rectal adenocarcinoma.  The polyp in the ascending colon is a tubular adenoma.  MRI showed cT1/T2N0 disease  -04/21/2022, patient underwent robotic assisted ultralow anterior resection. Pathology showed moderately differentiated adenocarcinoma, 4.5 cm in maximal extent, with focal extension through muscularis propria into perirectal soft tissue.  3 lymph nodes positive for metastatic carcinoma.  Negative margin.  pT3 pN1b, MSI stable.   05/03/2022 Cancer Staging   Staging form: Colon and Rectum, AJCC 8th Edition - Pathologic stage from 05/03/2022: Stage IIIB (pT3, pN1b, cM0) - Signed by April Zelphia, MD on 05/03/2022 Stage prefix: Initial diagnosis   05/11/2022 Miscellaneous   Medi port placed by April Luna   05/26/2022 -  Chemotherapy   FOLFOX Q2 weeks x 4   05/26/2022 - 07/09/2022 Chemotherapy   Patient is on Treatment Plan : COLORECTAL FOLFOX q14d x 8 cycles     05/26/2022 - 12/13/2022 Chemotherapy   Patient is on Treatment Plan : COLORECTAL FOLFOX q14d x 4 months     07/27/2022 -   Chemotherapy   Concurrent chemotherapy- Xeloda   1300mg  BID and radiation.    07/27/2022 - 09/12/2022 Radiation Therapy    concurrent chemotherapy [Xeloda  1300 mg BID] with RT    09/20/2022 Imaging   CT Angiogram chest PET protocol There is no evidence of pulmonary artery embolism. There is no evidence of thoracic aortic dissection.   Small linear patchy alveolar infiltrate is seen in medial segment of right middle lobe suggesting atelectasis/pneumonia.     09/28/2022 - 09/30/2022 Hospital Admission   Admission due to acute respiratory failure with hypoxia, COPD exacerbation   09/28/2022 Imaging   CT angio chest PE protocol 1. Negative for acute PE  or thoracic aortic dissection. 2. New ground-glass infiltrates anteriorly in bilateral upper lobes,may represent atypical edema, infectious or inflammatory process. 3.  Aortic Atherosclerosis     12/22/2022 Imaging   CT chest abdomen pelvis w contrast  Stable exam. No evidence of recurrent or metastatic carcinoma within the chest, abdomen, or pelvis.   Left nephrolithiasis and renal parenchymal atrophy. No evidence of ureteral calculi or hydronephrosis.   Aortic Atherosclerosis   05/31/2023 Imaging   CT chest abdomen pelvis with contrast showed 1. New hypodense 16 mm lesion in the right lobe of the liver with very subtle adjacent 5 mm lesion, suspicious for metastatic disease. 2. Small bilateral pulmonary nodules, some of which were subtly evident on prior examination only in retrospect, some of which demonstrate degree of cavitation, suspicious for metastatic disease. 3. Prominent retroperitoneal lymph nodes are similar prior. 4. Prior low anterior resection with Hartmann's pouch formation,similar small amount of perirectal/presacral soft tissue and fluid,favored postsurgical/posttreatment change. Suggest continued attention on follow-up imaging. 5. Large volume of formed stool in the colon. 6. Nonobstructive left renal calculi measure  under 1 cm.     06/08/2023 Relapse/Recurrence   Ultrasound-guided liver biopsy showed Pathology showed metastatic carcinoma, morphology consistent with patient's clinical history of rectal adenocarcinoma.  [employer] NGS 648 gene panel showed TP53 missense variant, APC frameshift, TCF7L2 frameshift, FLT3 copy number gain, TMB 4.80m/MB MSI stable.  Wildtype KRAS/NRAS  [employer] RNA Seq - ERBB3 overexpressed, VEGFA overexpressed, NRAS overexpressed.     06/18/2023 Cancer Staging   Staging form: Colon and Rectum, AJCC 8th Edition - Pathologic stage from 06/18/2023: Stage IVA (rpTX, pN0, pM1a) - Signed by Babara Call, MD on 06/18/2023 Stage prefix: Recurrence Total positive nodes: 0   07/02/2023 - 06/04/2024 Chemotherapy   COLORECTAL FOLFIRI + Bevacizumab  q14d      11/13/2023 Imaging   CT chest abdomen pelvis w contrast showed  1. Increase in cavitation with decreased soft tissue involving the scattered bilateral pulmonary nodules, consistent with treatment response. No new suspicious pulmonary nodules or masses. 2. Decrease in size and conspicuity of the hypodense lesions in the right lobe of the liver, consistent with treatment response. No new suspicious hepatic lesion identified. 3. Stable prominent nonspecific retroperitoneal lymph nodes. Suggest continued attention on follow-up imaging. 4. Similar surgical changes of prior low anterior resection with left anterior abdominal wall colostomy. Similar mesorectal/presacral soft tissue, no new suspicious enhancing nodularity. 5. Similar soft tissue nodularity in the bilateral posterior gluteal subcutaneous soft tissues. 6. Moderate volume of formed stool in the colon. Correlate for constipation.   03/03/2024 Imaging   CT chest abdomen pelvis w contrast showed 1. 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 2. No new or progressive disease. 3. Similar prominent but not pathologically sized abdominal retroperitoneal nodes,  nonspecific. 4. Status post low anterior resection with descending colostomy. Possible constipation. 5. Age advanced coronary artery atherosclerosis. Recommend assessment of coronary risk factors. 6.  Aortic Atherosclerosis (ICD10-I70.0). 7. Left nephrolithiasis and renal scarring.    05/27/2024 Imaging   CT chest abdomen pelvis w contrast showed 1. Status post abdominoperineal resection with left lower quadrant end colostomy. 2. Unchanged enlarged retroperitoneal lymph nodes. 3. Unchanged small hypodense lesion of the posterior right lobe of the liver. 4. Very tiny, irregular cavitary nodules are unchanged. 5. New areas of irregular atelectasis or consolidation of the peripheral right lower lobe, nonspecific and most likely infectious or inflammatory. Metastatic disease distinctly not favored. Attention on follow-up. 6. Atrophic, scarred left kidney with nonobstructive  calculi of the inferior pole. No hydronephrosis.   Aortic Atherosclerosis (ICD10-I70.0).     06/18/2024 -  Chemotherapy   5-FU and Bevacizumab  maintenance.    Patient has bipolar and schizophrenia..  Patient is married  She has housing issue due to previous history of eviction.  she does not live with her husband currently. She lives with some family members.   + chronic dysuria and urgency she was seen by Urology and was started on Gemteza.  Symptoms are improved.    INTERVAL HISTORY KAGAN HIETPAS is a 53 y.o. female who has above history reviewed by me today presents for follow up visit for rectal cancer.   Denies any melena or blood in the stool. Stable neuropathy symptoms. + loose BM, stable baseline.  + dysuria no fever or flank pain   Review of Systems  Constitutional:  Positive for fatigue. Negative for appetite change, chills and fever.  HENT:   Negative for hearing loss, mouth sores and voice change.   Eyes:  Negative for eye problems.  Respiratory:  Negative for chest tightness, cough and  shortness of breath.   Cardiovascular:  Negative for chest pain.  Gastrointestinal:  Positive for diarrhea. Negative for abdominal distention, abdominal pain, blood in stool and nausea.       + colostomy   Endocrine: Negative for hot flashes.  Genitourinary:  Negative for difficulty urinating, dysuria and frequency.   Musculoskeletal:  Negative for arthralgias.  Skin:  Negative for itching and rash.  Neurological:  Positive for numbness. Negative for extremity weakness.  Hematological:  Negative for adenopathy.  Psychiatric/Behavioral:  Negative for confusion.     MEDICAL HISTORY:  Past Medical History:  Diagnosis Date   Allergy    pollen   Anxiety    Arthritis    right hip   Bipolar 1 disorder (HCC)    Cancer (HCC)    rectal   Chemotherapy-induced neuropathy (HCC) 10/24/2022   Chronic kidney disease    COPD (chronic obstructive pulmonary disease) (HCC)    Depression    Family history of adverse reaction to anesthesia    grand father had a stroke during anesthesia   Family history of breast cancer    Family history of colon cancer    Family history of uterine cancer    GERD (gastroesophageal reflux disease)    History of kidney stones    Hyperlipidemia    Hypertension    Hypothyroidism    Panic attack    Pneumonia    Psoriasis    Sleep apnea 08/11/2021   No CPAP   Type 2 diabetes mellitus with microalbuminuria, without long-term current use of insulin  (HCC) 06/24/2019    SURGICAL HISTORY: Past Surgical History:  Procedure Laterality Date   BREAST BIOPSY Left 12/14/2021   us  bx, venus marker, path pending   CESAREAN SECTION     COLONOSCOPY WITH PROPOFOL  N/A 02/09/2022   Procedure: COLONOSCOPY WITH PROPOFOL ;  Surgeon: Unk Corinn Skiff, MD;  Location: [facility];  Service: Endoscopy;  Laterality: N/A;  sleep apnea   CYSTOSCOPY W/ RETROGRADES Left 11/08/2018   Procedure: CYSTOSCOPY WITH RETROGRADE PYELOGRAM;  Surgeon: Francisca Redell BROCKS, MD;  Location:  [facility];  Service: Urology;  Laterality: Left;   CYSTOSCOPY/URETEROSCOPY/HOLMIUM LASER/STENT PLACEMENT Left 11/08/2018   Procedure: CYSTOSCOPY/URETEROSCOPY/HOLMIUM LASER/STENT PLACEMENT;  Surgeon: Francisca Redell BROCKS, MD;  Location: [facility];  Service: Urology;  Laterality: Left;   IR CV LINE INJECTION  06/25/2023   IR IMAGING GUIDED PORT INSERTION  05/11/2022  IR REMOVE CV FIBRIN SHEATH  06/27/2023   MOUTH SURGERY     wisdom teeth extraction   MOUTH SURGERY     teeth removal   POLYPECTOMY N/A 02/09/2022   Procedure: POLYPECTOMY;  Surgeon: Unk Corinn Skiff, MD;  Location: [facility];  Service: Endoscopy;  Laterality: N/A;   XI ROBOTIC ASSISTED LOWER ANTERIOR RESECTION N/A 04/21/2022   Procedure: XI ROBOTIC ASSISTED LOWER ANTERIOR RESECTION WITH COLOSTOMY, BILATERAL TAP BLOCK, ASSESSMENT OF TISSUE PERFUSSION WITH FIREFLY INJECTION;  Surgeon: Teresa Lonni HERO, MD;  Location: [facility];  Service: General;  Laterality: N/A;    SOCIAL HISTORY: Social History   Socioeconomic History   Marital status: Married    Spouse name: Morene Jama Pereyra   Number of children: 1   Years of education: 12   Highest education level: High school graduate  Occupational History   Occupation: unemployed    Comment: disabled  Tobacco Use   Smoking status: Some Days    Current packs/day: 0.25    Average packs/day: 0.3 packs/day for 39.3 years (9.8 ttl pk-yrs)    Types: Cigarettes    Start date: 05/13/1985    Passive exposure: Current   Smokeless tobacco: Never   Tobacco comments:    5-6 cigarettes weekly- 11/07/2022  Vaping Use   Vaping status: Former   Start date: 05/25/2018   Quit date: 09/24/2018  Substance and Sexual Activity   Alcohol use: Not Currently    Alcohol/week: 4.0 standard drinks of alcohol    Types: 4 Glasses of wine per week    Comment: quit ETOH in Feb. 2023   Drug use: Yes    Types: Marijuana    Comment: 2 days ago   Sexual activity: Not Currently    Birth  control/protection: Post-menopausal  Other Topics Concern   Not on file  Social History Narrative   Lives with husband and son    Social Drivers of Health   Financial Resource Strain: High Risk (08/30/2023)   Overall Financial Resource Strain (CARDIA)    Difficulty of Paying Living Expenses: Hard  Food Insecurity: Food Insecurity Present (06/03/2024)   Hunger Vital Sign    Worried About Running Out of Food in the Last Year: Sometimes true    Ran Out of Food in the Last Year: Sometimes true  Transportation Needs: No Transportation Needs (08/30/2023)   PRAPARE - Administrator, Civil Service (Medical): No    Lack of Transportation (Non-Medical): No  Physical Activity: Inactive (08/30/2023)   Exercise Vital Sign    Days of Exercise per Week: 0 days    Minutes of Exercise per Session: 0 min  Stress: Stress Concern Present (08/30/2023)   [employer] of Occupational Health - Occupational Stress Questionnaire    Feeling of Stress : Very much  Social Connections: Moderately Integrated (08/30/2023)   Social Connection and Isolation Panel    Frequency of Communication with Friends and Family: Never    Frequency of Social Gatherings with Friends and Family: Once a week    Attends Religious Services: More than 4 times per year    Active Member of [employer] or Organizations: No    Attends Engineer, structural: 1 to 4 times per year    Marital Status: Married  Catering manager Violence: Not At Risk (08/30/2023)   Humiliation, Afraid, Rape, and Kick questionnaire    Fear of Current or Ex-Partner: No    Emotionally Abused: No    Physically Abused: No  Sexually Abused: No    FAMILY HISTORY: Family History  Problem Relation Age of Onset   Depression Mother    Anxiety disorder Mother    Diabetes Mother    Hypertension Mother    Hyperlipidemia Mother    Cancer Mother    Uterine cancer Mother 68   Cervical cancer Mother 58   Colon cancer Father    Depression Brother     Anxiety disorder Brother    Cancer Maternal Aunt        unk types   Diabetes Mellitus II Maternal Grandmother    Hypercholesterolemia Maternal Grandmother    Breast cancer Maternal Grandmother    Cancer Paternal Grandmother    Diabetes Paternal Grandmother    Melanoma Paternal Grandmother    Stomach cancer Paternal Grandmother     ALLERGIES:  is allergic to metformin and related, nsaids, perphenazine, sulfa antibiotics, abilify [aripiprazole], and penicillins.  MEDICATIONS:  Current Outpatient Medications  Medication Sig Dispense Refill   acetaminophen  (TYLENOL ) 325 MG tablet Take 650 mg by mouth every 6 (six) hours as needed for headache (pain).     albuterol  (VENTOLIN  HFA) 108 (90 Base) MCG/ACT inhaler Inhale 2 puffs into the lungs every 6 (six) hours as needed for wheezing or shortness of breath. 1 each 5   amantadine  (SYMMETREL ) 100 MG capsule Take 100 mg by mouth 2 (two) times daily.     blood glucose meter kit and supplies Dispense based on patient and insurance preference. Use up to four times daily as directed. (FOR ICD-10 E10.9, E11.9). 1 each 0   Budeson-Glycopyrrol-Formoterol  (BREZTRI  AEROSPHERE) 160-9-4.8 MCG/ACT AERO Inhale 2 puffs into the lungs in the morning and at bedtime. 10.7 g 2   buPROPion  (WELLBUTRIN  XL) 300 MG 24 hr tablet Take 300 mg by mouth daily.     Cholecalciferol  (VITAMIN D -3) 125 MCG (5000 UT) TABS Take 5,000 Units by mouth daily. 30 tablet 1   clonazePAM (KLONOPIN) 0.5 MG tablet Take 1 mg by mouth 2 (two) times daily as needed.     dexamethasone  (DECADRON ) 4 MG tablet Take 2 tablets (8 mg total) by mouth See admin instructions. Take 8mg  daily for 2 days after each chemotherapy. 30 tablet 1   diltiazem  (CARDIZEM  CD) 120 MG 24 hr capsule TAKE 1 CAPSULE BY MOUTH DAILY 90 capsule 1   docusate sodium  (COLACE) 100 MG capsule TAKE 1 CAPSULE BY MOUTH TWICE DAILY 60 capsule 2   FARXIGA  10 MG TABS tablet TAKE 1 TABLET BY MOUTH DAILY BEFORE BREAKFAST 90 tablet 0    gabapentin  (NEURONTIN ) 300 MG capsule Take 300 mg by mouth 2 (two) times daily.     icosapent  Ethyl (VASCEPA ) 1 g capsule Take 2 capsules (2 g total) by mouth in the morning and at bedtime. 120 capsule 2   levothyroxine  (SYNTHROID ) 75 MCG tablet TAKE 1 TABLET BY MOUTH DAILY BEFORE BREAKFAST 90 tablet 2   lidocaine -prilocaine  (EMLA ) cream Apply to affected area once 30 g 3   loperamide  (IMODIUM ) 2 MG capsule Take 1 capsule (2 mg total) by mouth See admin instructions. Initial: 4 mg,the 2 mg every 2 hours (4 mg every 4 hours at night)  maximum: 16 mg/day 120 capsule 2   loratadine  (CLARITIN ) 10 MG tablet Take 10 mg by mouth every morning.     magic mouthwash w/lidocaine  SOLN Take 5 mLs by mouth 4 (four) times daily as needed for mouth pain. Sig: Swish/Swallow 5-10 ml four times a day as needed. Dispense 480 ml. 1RF 480  mL 1   ondansetron  (ZOFRAN ) 8 MG tablet Take 1 tablet (8 mg total) by mouth every 8 (eight) hours as needed for nausea, vomiting or refractory nausea / vomiting. Start on the third day after chemotherapy. 30 tablet 1   paliperidone  (INVEGA  SUSTENNA) 234 MG/1.5ML SUSY injection Inject 234 mg into the muscle every 30 (thirty) days. On or about the 14th of each month     pantoprazole  (PROTONIX ) 40 MG tablet TAKE 1 TABLET BY MOUTH DAILY 90 tablet 2   pioglitazone  (ACTOS ) 15 MG tablet Take 1 tablet (15 mg total) by mouth daily. 90 tablet 1   potassium chloride  SA (KLOR-CON  M) 20 MEQ tablet Take 1 tablet (20 mEq total) by mouth daily. 30 tablet 0   promethazine  (PHENERGAN ) 12.5 MG tablet Take 1 tablet (12.5 mg total) by mouth every 6 (six) hours as needed for nausea or vomiting. 30 tablet 0   rosuvastatin  (CRESTOR ) 40 MG tablet TAKE 1 TABLET BY MOUTH EVERY MORNING 90 tablet 0   sertraline  (ZOLOFT ) 100 MG tablet Take 200 mg by mouth every morning.     Spacer/Aero-Holding Chambers (AEROCHAMBER MV) inhaler Use as instructed 1 each 0   Vibegron  (GEMTESA ) 75 MG TABS Take 1 tablet (75 mg total) by  mouth daily.     No current facility-administered medications for this visit.   Facility-Administered Medications Ordered in Other Visits  Medication Dose Route Frequency Provider Last Rate Last Admin   albuterol  (PROVENTIL ) (2.5 MG/3ML) 0.083% nebulizer solution 2.5 mg  2.5 mg Nebulization Once Dgayli, Khabib, MD       sodium chloride  flush (NS) 0.9 % injection 10 mL  10 mL Intracatheter PRN Babara Call, MD   10 mL at 08/15/23 1327   sodium chloride  flush (NS) 0.9 % injection 10 mL  10 mL Intracatheter PRN Babara Call, MD   10 mL at 03/26/24 1255     PHYSICAL EXAMINATION: ECOG PERFORMANCE STATUS: 0 - Asymptomatic Vitals:   08/13/24 0922  BP: 130/88  Pulse: 83  Resp: 18  Temp: 97.8 F (36.6 C)  SpO2: 100%     Filed Weights   08/13/24 0922  Weight: 119 lb (54 kg)      Physical Exam Constitutional:      General: She is not in acute distress. HENT:     Head: Normocephalic and atraumatic.  Eyes:     General: No scleral icterus. Cardiovascular:     Rate and Rhythm: Normal rate and regular rhythm.  Pulmonary:     Effort: Pulmonary effort is normal. No respiratory distress.     Breath sounds: No wheezing.     Comments: Decreased breath sound bilaterally Abdominal:     General: Bowel sounds are normal. There is no distension.     Palpations: Abdomen is soft.     Comments: +colostomy   Musculoskeletal:        General: No deformity. Normal range of motion.     Cervical back: Normal range of motion and neck supple.  Skin:    General: Skin is warm and dry.     Findings: No erythema or rash.  Neurological:     Mental Status: She is alert and oriented to person, place, and time. Mental status is at baseline.     Cranial Nerves: No cranial nerve deficit.     Coordination: Coordination normal.  Psychiatric:        Mood and Affect: Mood normal.     LABORATORY DATA:  I have reviewed the data  as listed    Latest Ref Rng & Units 08/13/2024    8:50 AM 07/30/2024    8:33 AM  07/16/2024    8:24 AM  CBC  WBC 4.0 - 10.5 K/uL 5.0  6.1  8.9   Hemoglobin 12.0 - 15.0 g/dL 86.7  86.7  86.7   Hematocrit 36.0 - 46.0 % 38.6  39.5  38.9   Platelets 150 - 400 K/uL 95  117  105       Latest Ref Rng & Units 08/13/2024    8:50 AM 07/30/2024    8:33 AM 07/16/2024    8:24 AM  CMP  Glucose 70 - 99 mg/dL 842  859  838   BUN 6 - 20 mg/dL 9  14  8    Creatinine 0.44 - 1.00 mg/dL 9.17  9.03  8.99   Sodium 135 - 145 mmol/L 136  138  137   Potassium 3.5 - 5.1 mmol/L 3.4  3.8  3.5   Chloride 98 - 111 mmol/L 105  106  106   CO2 22 - 32 mmol/L 23  24  24    Calcium  8.9 - 10.3 mg/dL 9.0  9.1  9.2   Total Protein 6.5 - 8.1 g/dL 6.4  6.3  6.4   Total Bilirubin 0.0 - 1.2 mg/dL 0.6  0.5  0.4   Alkaline Phos 38 - 126 U/L 74  73  82   AST 15 - 41 U/L 19  27  22    ALT 0 - 44 U/L 11  17  15          RADIOGRAPHIC STUDIES: I have personally reviewed the radiological images as listed and agreed with the findings in the report. No results found.

## 2024-08-13 NOTE — Progress Notes (Signed)
 Patient is having hip pain, rates her pain at about a 6 today, the pain makes it to were she can't sleep good. She hasn't had an appetite for a little bit now.

## 2024-08-13 NOTE — Assessment & Plan Note (Signed)
 Grade 2  Continue gabapentin 300mg  BID.

## 2024-08-13 NOTE — Assessment & Plan Note (Addendum)
 History of Stage III. pT3 pN1b cM0, s/p APR. 05/2023 Stage IV current rectal adenocarcinoma Currently on adjuvant chemotherapy. S/p FOLFOX x 4 cycles S/p concurrent chemotherapy [Xeloda  1300 mg BID] with RT, and then another 4 cycles of adjuvant FOLFOX [dose reduced oxaliplatin  and omit 5-FU bolus 05/2023 CT imaging indicates disease progression. liver biopsy pathology showed metastatic carcinoma--> 06/2023 FOLFIRI + Bevacizumab --> 10/2023 and 02/2024 CT partial response --> 05/2024 CT stable NGS showed TP53 missense variant, APC frameshift, TCF7L2 Frameshift, FLT3 copy number gain., TMB 4.7, pMMR Wildtype KRAS/NRAS  Labs are reviewed and discussed with patient. Shared decision was made to hold off irinotecan  given stable disease. .  Negative Signatera circulating tumor DNA Hold   5-Fu + Bevacizumab 

## 2024-08-13 NOTE — Assessment & Plan Note (Signed)
 Stable K level. Continue oral KCL 20meq daily.

## 2024-08-13 NOTE — Assessment & Plan Note (Signed)
 Chemotherapy induced Trending low, close monitor.

## 2024-08-14 ENCOUNTER — Other Ambulatory Visit: Payer: Self-pay

## 2024-08-14 LAB — URINE CULTURE: Culture: <10000 — AB

## 2024-08-14 LAB — CEA: CEA: 4.2 ng/mL (ref 0.0–4.7)

## 2024-08-15 ENCOUNTER — Inpatient Hospital Stay

## 2024-08-20 ENCOUNTER — Encounter: Payer: Self-pay | Admitting: Nurse Practitioner

## 2024-08-20 ENCOUNTER — Encounter: Payer: Self-pay | Admitting: Oncology

## 2024-08-27 ENCOUNTER — Encounter: Payer: Self-pay | Admitting: Oncology

## 2024-08-27 ENCOUNTER — Inpatient Hospital Stay

## 2024-08-27 ENCOUNTER — Inpatient Hospital Stay: Attending: Oncology

## 2024-08-27 ENCOUNTER — Inpatient Hospital Stay (HOSPITAL_BASED_OUTPATIENT_CLINIC_OR_DEPARTMENT_OTHER): Admitting: Oncology

## 2024-08-27 VITALS — BP 118/79 | HR 89 | Temp 97.1°F | Resp 18 | Wt 117.6 lb

## 2024-08-27 VITALS — BP 130/76 | HR 78 | Temp 96.0°F | Resp 19

## 2024-08-27 DIAGNOSIS — G4730 Sleep apnea, unspecified: Secondary | ICD-10-CM | POA: Insufficient documentation

## 2024-08-27 DIAGNOSIS — G620 Drug-induced polyneuropathy: Secondary | ICD-10-CM | POA: Insufficient documentation

## 2024-08-27 DIAGNOSIS — I129 Hypertensive chronic kidney disease with stage 1 through stage 4 chronic kidney disease, or unspecified chronic kidney disease: Secondary | ICD-10-CM | POA: Diagnosis not present

## 2024-08-27 DIAGNOSIS — T451X5A Adverse effect of antineoplastic and immunosuppressive drugs, initial encounter: Secondary | ICD-10-CM

## 2024-08-27 DIAGNOSIS — F209 Schizophrenia, unspecified: Secondary | ICD-10-CM | POA: Insufficient documentation

## 2024-08-27 DIAGNOSIS — D122 Benign neoplasm of ascending colon: Secondary | ICD-10-CM | POA: Diagnosis not present

## 2024-08-27 DIAGNOSIS — R300 Dysuria: Secondary | ICD-10-CM

## 2024-08-27 DIAGNOSIS — N200 Calculus of kidney: Secondary | ICD-10-CM | POA: Diagnosis not present

## 2024-08-27 DIAGNOSIS — E785 Hyperlipidemia, unspecified: Secondary | ICD-10-CM | POA: Insufficient documentation

## 2024-08-27 DIAGNOSIS — R634 Abnormal weight loss: Secondary | ICD-10-CM | POA: Diagnosis not present

## 2024-08-27 DIAGNOSIS — C20 Malignant neoplasm of rectum: Secondary | ICD-10-CM | POA: Insufficient documentation

## 2024-08-27 DIAGNOSIS — E039 Hypothyroidism, unspecified: Secondary | ICD-10-CM | POA: Insufficient documentation

## 2024-08-27 DIAGNOSIS — Z5189 Encounter for other specified aftercare: Secondary | ICD-10-CM | POA: Diagnosis not present

## 2024-08-27 DIAGNOSIS — I2510 Atherosclerotic heart disease of native coronary artery without angina pectoris: Secondary | ICD-10-CM | POA: Insufficient documentation

## 2024-08-27 DIAGNOSIS — M25551 Pain in right hip: Secondary | ICD-10-CM | POA: Diagnosis not present

## 2024-08-27 DIAGNOSIS — Z7984 Long term (current) use of oral hypoglycemic drugs: Secondary | ICD-10-CM | POA: Insufficient documentation

## 2024-08-27 DIAGNOSIS — J9601 Acute respiratory failure with hypoxia: Secondary | ICD-10-CM | POA: Diagnosis not present

## 2024-08-27 DIAGNOSIS — Z7952 Long term (current) use of systemic steroids: Secondary | ICD-10-CM | POA: Insufficient documentation

## 2024-08-27 DIAGNOSIS — M25552 Pain in left hip: Secondary | ICD-10-CM | POA: Insufficient documentation

## 2024-08-27 DIAGNOSIS — E876 Hypokalemia: Secondary | ICD-10-CM

## 2024-08-27 DIAGNOSIS — M25559 Pain in unspecified hip: Secondary | ICD-10-CM | POA: Insufficient documentation

## 2024-08-27 DIAGNOSIS — C779 Secondary and unspecified malignant neoplasm of lymph node, unspecified: Secondary | ICD-10-CM | POA: Insufficient documentation

## 2024-08-27 DIAGNOSIS — E1140 Type 2 diabetes mellitus with diabetic neuropathy, unspecified: Secondary | ICD-10-CM | POA: Diagnosis not present

## 2024-08-27 DIAGNOSIS — Z5111 Encounter for antineoplastic chemotherapy: Secondary | ICD-10-CM | POA: Diagnosis not present

## 2024-08-27 DIAGNOSIS — E1122 Type 2 diabetes mellitus with diabetic chronic kidney disease: Secondary | ICD-10-CM | POA: Diagnosis not present

## 2024-08-27 DIAGNOSIS — F17210 Nicotine dependence, cigarettes, uncomplicated: Secondary | ICD-10-CM | POA: Insufficient documentation

## 2024-08-27 DIAGNOSIS — Z79899 Other long term (current) drug therapy: Secondary | ICD-10-CM | POA: Diagnosis not present

## 2024-08-27 DIAGNOSIS — K5900 Constipation, unspecified: Secondary | ICD-10-CM | POA: Diagnosis not present

## 2024-08-27 DIAGNOSIS — Z87442 Personal history of urinary calculi: Secondary | ICD-10-CM | POA: Insufficient documentation

## 2024-08-27 DIAGNOSIS — Z800 Family history of malignant neoplasm of digestive organs: Secondary | ICD-10-CM | POA: Insufficient documentation

## 2024-08-27 DIAGNOSIS — J441 Chronic obstructive pulmonary disease with (acute) exacerbation: Secondary | ICD-10-CM | POA: Insufficient documentation

## 2024-08-27 DIAGNOSIS — I700 Atherosclerosis of aorta: Secondary | ICD-10-CM | POA: Insufficient documentation

## 2024-08-27 DIAGNOSIS — C787 Secondary malignant neoplasm of liver and intrahepatic bile duct: Secondary | ICD-10-CM | POA: Insufficient documentation

## 2024-08-27 DIAGNOSIS — Z923 Personal history of irradiation: Secondary | ICD-10-CM | POA: Insufficient documentation

## 2024-08-27 DIAGNOSIS — Z803 Family history of malignant neoplasm of breast: Secondary | ICD-10-CM | POA: Insufficient documentation

## 2024-08-27 DIAGNOSIS — F319 Bipolar disorder, unspecified: Secondary | ICD-10-CM | POA: Insufficient documentation

## 2024-08-27 DIAGNOSIS — Z933 Colostomy status: Secondary | ICD-10-CM | POA: Diagnosis not present

## 2024-08-27 DIAGNOSIS — Z79890 Hormone replacement therapy (postmenopausal): Secondary | ICD-10-CM | POA: Insufficient documentation

## 2024-08-27 LAB — CBC WITH DIFFERENTIAL (CANCER CENTER ONLY)
Abs Immature Granulocytes: 0.01 K/uL (ref 0.00–0.07)
Basophils Absolute: 0.1 K/uL (ref 0.0–0.1)
Basophils Relative: 1 %
Eosinophils Absolute: 0.1 K/uL (ref 0.0–0.5)
Eosinophils Relative: 1 %
HCT: 44 % (ref 36.0–46.0)
Hemoglobin: 14.8 g/dL (ref 12.0–15.0)
Immature Granulocytes: 0 %
Lymphocytes Relative: 12 %
Lymphs Abs: 0.8 K/uL (ref 0.7–4.0)
MCH: 34 pg (ref 26.0–34.0)
MCHC: 33.6 g/dL (ref 30.0–36.0)
MCV: 101.1 fL — ABNORMAL HIGH (ref 80.0–100.0)
Monocytes Absolute: 0.5 K/uL (ref 0.1–1.0)
Monocytes Relative: 8 %
Neutro Abs: 4.9 K/uL (ref 1.7–7.7)
Neutrophils Relative %: 78 %
Platelet Count: 116 K/uL — ABNORMAL LOW (ref 150–400)
RBC: 4.35 MIL/uL (ref 3.87–5.11)
RDW: 15.9 % — ABNORMAL HIGH (ref 11.5–15.5)
WBC Count: 6.3 K/uL (ref 4.0–10.5)
nRBC: 0 % (ref 0.0–0.2)

## 2024-08-27 LAB — CMP (CANCER CENTER ONLY)
ALT: 12 U/L (ref 0–44)
AST: 24 U/L (ref 15–41)
Albumin: 3.8 g/dL (ref 3.5–5.0)
Alkaline Phosphatase: 80 U/L (ref 38–126)
Anion gap: 11 (ref 5–15)
BUN: 14 mg/dL (ref 6–20)
CO2: 25 mmol/L (ref 22–32)
Calcium: 9.2 mg/dL (ref 8.9–10.3)
Chloride: 99 mmol/L (ref 98–111)
Creatinine: 1.06 mg/dL — ABNORMAL HIGH (ref 0.44–1.00)
GFR, Estimated: >60 mL/min (ref >60)
Glucose, Bld: 160 mg/dL — ABNORMAL HIGH (ref 70–99)
Potassium: 4.1 mmol/L (ref 3.5–5.1)
Sodium: 135 mmol/L (ref 135–145)
Total Bilirubin: 0.6 mg/dL (ref 0.0–1.2)
Total Protein: 6.5 g/dL (ref 6.5–8.1)

## 2024-08-27 LAB — TOTAL PROTEIN, URINE DIPSTICK: Protein, ur: NEGATIVE mg/dL

## 2024-08-27 MED ORDER — SODIUM CHLORIDE 0.9 % IV SOLN
Freq: Once | INTRAVENOUS | Status: AC
  Filled 2024-08-27: qty 250

## 2024-08-27 MED ORDER — SODIUM CHLORIDE 0.9 % IV SOLN
2400.0000 mg/m2 | INTRAVENOUS | Status: DC
  Administered 2024-08-27: 3500 mg via INTRAVENOUS
  Filled 2024-08-27: qty 70

## 2024-08-27 MED ORDER — SODIUM CHLORIDE 0.9 % IV SOLN
400.0000 mg/m2 | Freq: Once | INTRAVENOUS | Status: AC
  Administered 2024-08-27: 596 mg via INTRAVENOUS
  Filled 2024-08-27: qty 29.8

## 2024-08-27 MED ORDER — DEXAMETHASONE SODIUM PHOSPHATE 10 MG/ML IJ SOLN
10.0000 mg | Freq: Once | INTRAMUSCULAR | Status: AC
  Administered 2024-08-27: 10 mg via INTRAVENOUS
  Filled 2024-08-27: qty 1

## 2024-08-27 MED ORDER — ONDANSETRON HCL 4 MG/2ML IJ SOLN
8.0000 mg | INTRAMUSCULAR | Status: DC | PRN
  Administered 2024-08-27: 8 mg via INTRAVENOUS
  Filled 2024-08-27: qty 4

## 2024-08-27 MED ORDER — SODIUM CHLORIDE 0.9 % IV SOLN
5.0000 mg/kg | Freq: Once | INTRAVENOUS | Status: AC
  Administered 2024-08-27: 275 mg via INTRAVENOUS
  Filled 2024-08-27: qty 11

## 2024-08-27 MED ORDER — FLUOROURACIL CHEMO INJECTION 2.5 GM/50ML
400.0000 mg/m2 | Freq: Once | INTRAVENOUS | Status: AC
  Administered 2024-08-27: 600 mg via INTRAVENOUS
  Filled 2024-08-27: qty 12

## 2024-08-27 NOTE — Assessment & Plan Note (Signed)
 History of Stage III. pT3 pN1b cM0, s/p APR. 05/2023 Stage IV current rectal adenocarcinoma Currently on adjuvant chemotherapy. S/p FOLFOX x 4 cycles S/p concurrent chemotherapy [Xeloda  1300 mg BID] with RT, and then another 4 cycles of adjuvant FOLFOX [dose reduced oxaliplatin  and omit 5-FU bolus 05/2023 CT imaging indicates disease progression. liver biopsy pathology showed metastatic carcinoma--> 06/2023 FOLFIRI + Bevacizumab --> 10/2023 and 02/2024 CT partial response --> 05/2024 CT stable - Shared decision was made to hold off irinotecan  given stable disease. .  NGS showed TP53 missense variant, APC frameshift, TCF7L2 Frameshift, FLT3 copy number gain., TMB 4.7, pMMR Wildtype KRAS/NRAS  Labs are reviewed and discussed with patient. Negative Signatera circulating tumor DNA Proceed with 5-Fu + Bevacizumab 

## 2024-08-27 NOTE — Assessment & Plan Note (Signed)
 Stable K level. Continue oral KCL 20meq daily.

## 2024-08-27 NOTE — Assessment & Plan Note (Signed)
 Urine culture showed less than 10,000 colonies. No need for antibiotics.  Recommend patient to take Gemtiza

## 2024-08-27 NOTE — Assessment & Plan Note (Signed)
Refer to nutritionist 

## 2024-08-27 NOTE — Assessment & Plan Note (Signed)
 Chemotherapy plan as listed above

## 2024-08-27 NOTE — Assessment & Plan Note (Signed)
 Acute on chronic.  Associated sciatic pain.  Refer to orthopedics in June.

## 2024-08-27 NOTE — Assessment & Plan Note (Signed)
 Grade 2  Continue gabapentin 300mg  BID.

## 2024-08-27 NOTE — Progress Notes (Signed)
 Hematology/Oncology Progress note [phone] [phone]      CHIEF COMPLAINTS/REASON FOR VISIT:  Follow-up for Stage IV  rectal cancer treatments.  ASSESSMENT & PLAN:   Cancer Staging  Rectal cancer [facility]) Staging form: Colon and Rectum, AJCC 8th Edition - Pathologic stage from 05/03/2022: Stage IIIB (pT3, pN1b, cM0) - Signed by Babara Call, MD on 05/03/2022 - Pathologic stage from 06/18/2023: Stage IVA (rpTX, rpN0, rpM1a) - Signed by Babara Call, MD on 06/18/2023   Rectal cancer [facility]) History of Stage III. pT3 pN1b cM0, s/p APR. 05/2023 Stage IV current rectal adenocarcinoma Currently on adjuvant chemotherapy. S/p FOLFOX x 4 cycles S/p concurrent chemotherapy [Xeloda  1300 mg BID] with RT, and then another 4 cycles of adjuvant FOLFOX [dose reduced oxaliplatin  and omit 5-FU bolus 05/2023 CT imaging indicates disease progression. liver biopsy pathology showed metastatic carcinoma--> 06/2023 FOLFIRI + Bevacizumab --> 10/2023 and 02/2024 CT partial response --> 05/2024 CT stable - Shared decision was made to hold off irinotecan  given stable disease. .  NGS showed TP53 missense variant, APC frameshift, TCF7L2 Frameshift, FLT3 copy number gain., TMB 4.7, pMMR Wildtype KRAS/NRAS  Labs are reviewed and discussed with patient. Negative Signatera circulating tumor DNA Proceed with 5-Fu + Bevacizumab      Chemotherapy-induced neuropathy (HCC) Grade 2  Continue gabapentin  300mg  BID.   Dysuria Urine culture showed less than 10,000 colonies. No need for antibiotics.  Recommend patient to take [place]  Encounter for antineoplastic chemotherapy Chemotherapy plan as listed above  Hypokalemia Stable K level. Continue oral KCL 20meq daily.  Weight loss Refer to nutritionist  Hip pain Acute on chronic.  Associated sciatic pain.  Refer to orthopedics in June.   Orders Placed This Encounter  Procedures   Genetic Screening Order    Standing Status:   Future    Expected Date:    09/10/2024    Expiration Date:   08/27/2025   Ambulatory Referral to [facility] Nutrition    Standing Status:   Future    Expiration Date:   08/27/2025    Referral Priority:   Routine    Referral Type:   Consultation    Referral Reason:   Specialty Services Required    Number of Visits Requested:   1   AMB referral to orthopedics    Referral Priority:   Routine    Referral Type:   Consultation    Number of Visits Requested:   1    Follow-up 2 week(s)  All questions were answered. The patient knows to call the clinic with any problems, questions or concerns.  Call Babara, MD, PhD [facility] Hematology Oncology 08/27/2024     HISTORY OF PRESENTING ILLNESS:   April Luna is a  53 y.o.  female presents for treatment of rectal cancer Oncology History  Rectal cancer (HCC)  02/26/2022 Imaging   MRI PELVIS WITHOUT CONTRAST- By imaging, rectal cancer stage:  T1/T2, N0, Mx    03/02/2022 Imaging   CT CHEST AND ABDOMEN WITH CONTRAST 1. No convincing evidence of metastatic disease within the chest or abdomen. 2. Atrophic left kidney with multifocal renal scarring and cortical calcifications as well as nonobstructive renal stones measuring up to 5 mm. 3. Prominent left-sided predominant retroperitoneal lymph nodes measuring up to 8 mm near the level of the renal hilum, overall decreased in size dating back to CT September 19, 2018 and favored reactive related to left renal inflammation. 4.  Aortic Atherosclerosis (ICD10-I70.0).   03/27/2022 Genetic Testing    [facility] Custom[employer]+RNA cancer  panel found no pathogenic mutations.    04/21/2022 Initial Diagnosis   Rectal cancer - baseline CEA 3.6 -02/09/2022, patient had colonoscopy which showed renal mass 10 cm from anal verge.  5 mm polyp in ascending colon.  Removed and retrieved. Pathology showed rectal adenocarcinoma.  The polyp in the ascending colon is a tubular adenoma.  MRI showed cT1/T2N0 disease  -04/21/2022, patient underwent robotic  assisted ultralow anterior resection. Pathology showed moderately differentiated adenocarcinoma, 4.5 cm in maximal extent, with focal extension through muscularis propria into perirectal soft tissue.  3 lymph nodes positive for metastatic carcinoma.  Negative margin.  pT3 pN1b, MSI stable.   05/03/2022 Cancer Staging   Staging form: Colon and Rectum, AJCC 8th Edition - Pathologic stage from 05/03/2022: Stage IIIB (pT3, pN1b, cM0) - Signed by Babara Call, MD on 05/03/2022 Stage prefix: Initial diagnosis   05/11/2022 Miscellaneous   Medi port placed by Dr.White   05/26/2022 -  Chemotherapy   FOLFOX Q2 weeks x 4   05/26/2022 - 07/09/2022 Chemotherapy   Patient is on Treatment Plan : COLORECTAL FOLFOX q14d x 8 cycles     05/26/2022 - 12/13/2022 Chemotherapy   Patient is on Treatment Plan : COLORECTAL FOLFOX q14d x 4 months     07/27/2022 -  Chemotherapy   Concurrent chemotherapy- Xeloda   1300mg  BID and radiation.    07/27/2022 - 09/12/2022 Radiation Therapy    concurrent chemotherapy [Xeloda  1300 mg BID] with RT    09/20/2022 Imaging   CT Angiogram chest PET protocol There is no evidence of pulmonary artery embolism. There is no evidence of thoracic aortic dissection.   Small linear patchy alveolar infiltrate is seen in medial segment of right middle lobe suggesting atelectasis/pneumonia.     09/28/2022 - 09/30/2022 Hospital Admission   Admission due to acute respiratory failure with hypoxia, COPD exacerbation   09/28/2022 Imaging   CT angio chest PE protocol 1. Negative for acute PE or thoracic aortic dissection. 2. New ground-glass infiltrates anteriorly in bilateral upper lobes,may represent atypical edema, infectious or inflammatory process. 3.  Aortic Atherosclerosis     12/22/2022 Imaging   CT chest abdomen pelvis w contrast  Stable exam. No evidence of recurrent or metastatic carcinoma within the chest, abdomen, or pelvis.   Left nephrolithiasis and renal parenchymal atrophy. No evidence  of ureteral calculi or hydronephrosis.   Aortic Atherosclerosis   05/31/2023 Imaging   CT chest abdomen pelvis with contrast showed 1. New hypodense 16 mm lesion in the right lobe of the liver with very subtle adjacent 5 mm lesion, suspicious for metastatic disease. 2. Small bilateral pulmonary nodules, some of which were subtly evident on prior examination only in retrospect, some of which demonstrate degree of cavitation, suspicious for metastatic disease. 3. Prominent retroperitoneal lymph nodes are similar prior. 4. Prior low anterior resection with Hartmann's pouch formation,similar small amount of perirectal/presacral soft tissue and fluid,favored postsurgical/posttreatment change. Suggest continued attention on follow-up imaging. 5. Large volume of formed stool in the colon. 6. Nonobstructive left renal calculi measure under 1 cm.     06/08/2023 Relapse/Recurrence   Ultrasound-guided liver biopsy showed Pathology showed metastatic carcinoma, morphology consistent with patient's clinical history of rectal adenocarcinoma.  [facility] NGS 648 gene panel showed TP53 missense variant, APC frameshift, TCF7L2 frameshift, FLT3 copy number gain, TMB 4.53m/MB MSI stable.  Wildtype KRAS/NRAS  [facility] RNA Seq - ERBB3 overexpressed, VEGFA overexpressed, NRAS overexpressed.     06/18/2023 Cancer Staging   Staging form: Colon and Rectum, AJCC 8th Edition -  Pathologic stage from 06/18/2023: Stage IVA (rpTX, pN0, pM1a) - Signed by Babara Call, MD on 06/18/2023 Stage prefix: Recurrence Total positive nodes: 0   07/02/2023 - 06/04/2024 Chemotherapy   COLORECTAL FOLFIRI + Bevacizumab  q14d      11/13/2023 Imaging   CT chest abdomen pelvis w contrast showed  1. Increase in cavitation with decreased soft tissue involving the scattered bilateral pulmonary nodules, consistent with treatment response. No new suspicious pulmonary nodules or masses. 2. Decrease in size and conspicuity of the hypodense lesions in  the right lobe of the liver, consistent with treatment response. No new suspicious hepatic lesion identified. 3. Stable prominent nonspecific retroperitoneal lymph nodes. Suggest continued attention on follow-up imaging. 4. Similar surgical changes of prior low anterior resection with left anterior abdominal wall colostomy. Similar mesorectal/presacral soft tissue, no new suspicious enhancing nodularity. 5. Similar soft tissue nodularity in the bilateral posterior gluteal subcutaneous soft tissues. 6. Moderate volume of formed stool in the colon. Correlate for constipation.   03/03/2024 Imaging   CT chest abdomen pelvis w contrast showed 1. 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 2. No new or progressive disease. 3. Similar prominent but not pathologically sized abdominal retroperitoneal nodes, nonspecific. 4. Status post low anterior resection with descending colostomy. Possible constipation. 5. Age advanced coronary artery atherosclerosis. Recommend assessment of coronary risk factors. 6.  Aortic Atherosclerosis (ICD10-I70.0). 7. Left nephrolithiasis and renal scarring.    05/27/2024 Imaging   CT chest abdomen pelvis w contrast showed 1. Status post abdominoperineal resection with left lower quadrant end colostomy. 2. Unchanged enlarged retroperitoneal lymph nodes. 3. Unchanged small hypodense lesion of the posterior right lobe of the liver. 4. Very tiny, irregular cavitary nodules are unchanged. 5. New areas of irregular atelectasis or consolidation of the peripheral right lower lobe, nonspecific and most likely infectious or inflammatory. Metastatic disease distinctly not favored. Attention on follow-up. 6. Atrophic, scarred left kidney with nonobstructive calculi of the inferior pole. No hydronephrosis.   Aortic Atherosclerosis (ICD10-I70.0).     06/18/2024 -  Chemotherapy   5-FU and Bevacizumab  maintenance.    Patient has bipolar and  schizophrenia..  Patient is married  She has housing issue due to previous history of eviction.  she does not live with her husband currently. She lives with some family members.   + chronic dysuria and urgency she was seen by Urology and was started on Gemteza.  Symptoms are improved.    INTERVAL HISTORY April Luna is a 53 y.o. female who has above history reviewed by me today presents for follow up visit for rectal cancer.   Denies any melena or blood in the stool. Stable neuropathy symptoms. + loose BM, stable baseline.  + dysuria no fever or flank pain. Patient has lost weight since last visit.  Appetite is fair.  She has bilateral acute on chronic hip pain which caused her to have insomnia.   Review of Systems  Constitutional:  Positive for fatigue. Negative for appetite change, chills and fever.  HENT:   Negative for hearing loss, mouth sores and voice change.   Eyes:  Negative for eye problems.  Respiratory:  Negative for chest tightness, cough and shortness of breath.   Cardiovascular:  Negative for chest pain.  Gastrointestinal:  Positive for diarrhea. Negative for abdominal distention, abdominal pain, blood in stool and nausea.       + colostomy   Endocrine: Negative for hot flashes.  Genitourinary:  Negative for difficulty urinating, dysuria and frequency.  Musculoskeletal:  Positive for arthralgias.  Skin:  Negative for itching and rash.  Neurological:  Positive for numbness. Negative for extremity weakness.  Hematological:  Negative for adenopathy.  Psychiatric/Behavioral:  Negative for confusion.     MEDICAL HISTORY:  Past Medical History:  Diagnosis Date   Allergy    pollen   Anxiety    Arthritis    right hip   Bipolar 1 disorder (HCC)    Cancer (HCC)    rectal   Chemotherapy-induced neuropathy (HCC) 10/24/2022   Chronic kidney disease    COPD (chronic obstructive pulmonary disease) (HCC)    Depression    Family history of adverse reaction to  anesthesia    grand father had a stroke during anesthesia   Family history of breast cancer    Family history of colon cancer    Family history of uterine cancer    GERD (gastroesophageal reflux disease)    History of kidney stones    Hyperlipidemia    Hypertension    Hypothyroidism    Panic attack    Pneumonia    Psoriasis    Sleep apnea 08/11/2021   No CPAP   Type 2 diabetes mellitus with microalbuminuria, without long-term current use of insulin  (HCC) 06/24/2019    SURGICAL HISTORY: Past Surgical History:  Procedure Laterality Date   BREAST BIOPSY Left 12/14/2021   us  bx, venus marker, path pending   CESAREAN SECTION     COLONOSCOPY WITH PROPOFOL  N/A 02/09/2022   Procedure: COLONOSCOPY WITH PROPOFOL ;  Surgeon: Unk Corinn Skiff, MD;  Location[facility];  Service: Endoscopy;  Laterality: N/A;  sleep apnea   CYSTOSCOPY W/ RETROGRADES Left 11/08/2018   Procedure: CYSTOSCOPY WITH RETROGRADE PYELOGRAM;  Surgeon: Francisca Redell BROCKS, MD;  Location: [facility];  Service: Urology;  Laterality: Left;   CYSTOSCOPY/URETEROSCOPY/HOLMIUM LASER/STENT PLACEMENT Left 11/08/2018   Procedure: CYSTOSCOPY/URETEROSCOPY/HOLMIUM LASER/STENT PLACEMENT;  Surgeon: Francisca Redell BROCKS, MD;  Location: [facility];  Service: Urology;  Laterality: Left;   IR CV LINE INJECTION  06/25/2023   IR IMAGING GUIDED PORT INSERTION  05/11/2022   IR REMOVE CV FIBRIN SHEATH  06/27/2023   MOUTH SURGERY     wisdom teeth extraction   MOUTH SURGERY     teeth removal   POLYPECTOMY N/A 02/09/2022   Procedure: POLYPECTOMY;  Surgeon: Unk Corinn Skiff, MD;  Location: [facility];  Service: Endoscopy;  Laterality: N/A;   XI ROBOTIC ASSISTED LOWER ANTERIOR RESECTION N/A 04/21/2022   Procedure: XI ROBOTIC ASSISTED LOWER ANTERIOR RESECTION WITH COLOSTOMY, BILATERAL TAP BLOCK, ASSESSMENT OF TISSUE PERFUSSION WITH FIREFLY INJECTION;  Surgeon: Teresa Lonni HERO, MD;  Location: [facility];  Service: General;  Laterality: N/A;     SOCIAL HISTORY: Social History   Socioeconomic History   Marital status: Married    Spouse name: Morene Jama Pereyra   Number of children: 1   Years of education: 12   Highest education level: High school graduate  Occupational History   Occupation: unemployed    Comment: disabled  Tobacco Use   Smoking status: Some Days    Current packs/day: 0.25    Average packs/day: 0.3 packs/day for 39.3 years (9.8 ttl pk-yrs)    Types: Cigarettes    Start date: 05/13/1985    Passive exposure: Current   Smokeless tobacco: Never   Tobacco comments:    5-6 cigarettes weekly- 11/07/2022  Vaping Use   Vaping status: Former   Start date: 05/25/2018   Quit date: 09/24/2018  Substance and Sexual Activity  Alcohol use: Not Currently    Alcohol/week: 4.0 standard drinks of alcohol    Types: 4 Glasses of wine per week    Comment: quit ETOH in Feb. 2023   Drug use: Yes    Types: Marijuana    Comment: 2 days ago   Sexual activity: Not Currently    Birth control/protection: Post-menopausal  Other Topics Concern   Not on file  Social History Narrative   Lives with husband and son    Social Drivers of Health   Financial Resource Strain: High Risk (08/30/2023)   Overall Financial Resource Strain (CARDIA)    Difficulty of Paying Living Expenses: Hard  Food Insecurity: Food Insecurity Present (08/13/2024)   Hunger Vital Sign    Worried About Running Out of Food in the Last Year: Sometimes true    Ran Out of Food in the Last Year: Sometimes true  Transportation Needs: No Transportation Needs (08/30/2023)   PRAPARE - Administrator, Civil Service (Medical): No    Lack of Transportation (Non-Medical): No  Physical Activity: Inactive (08/30/2023)   Exercise Vital Sign    Days of Exercise per Week: 0 days    Minutes of Exercise per Session: 0 min  Stress: Stress Concern Present (08/30/2023)   [employer] of Occupational Health - Occupational Stress Questionnaire    Feeling of Stress  : Very much  Social Connections: Moderately Integrated (08/30/2023)   Social Connection and Isolation Panel    Frequency of Communication with Friends and Family: Never    Frequency of Social Gatherings with Friends and Family: Once a week    Attends Religious Services: More than 4 times per year    Active Member of [employer] or Organizations: No    Attends Engineer, structural: 1 to 4 times per year    Marital Status: Married  Catering manager Violence: Not At Risk (08/30/2023)   Humiliation, Afraid, Rape, and Kick questionnaire    Fear of Current or Ex-Partner: No    Emotionally Abused: No    Physically Abused: No    Sexually Abused: No    FAMILY HISTORY: Family History  Problem Relation Age of Onset   Depression Mother    Anxiety disorder Mother    Diabetes Mother    Hypertension Mother    Hyperlipidemia Mother    Cancer Mother    Uterine cancer Mother 46   Cervical cancer Mother 69   Colon cancer Father    Depression Brother    Anxiety disorder Brother    Cancer Maternal Aunt        unk types   Diabetes Mellitus II Maternal Grandmother    Hypercholesterolemia Maternal Grandmother    Breast cancer Maternal Grandmother    Cancer Paternal Grandmother    Diabetes Paternal Grandmother    Melanoma Paternal Grandmother    Stomach cancer Paternal Grandmother     ALLERGIES:  is allergic to metformin and related, nsaids, perphenazine, sulfa antibiotics, abilify [aripiprazole], and penicillins.  MEDICATIONS:  Current Outpatient Medications  Medication Sig Dispense Refill   acetaminophen  (TYLENOL ) 325 MG tablet Take 650 mg by mouth every 6 (six) hours as needed for headache (pain).     albuterol  (VENTOLIN  HFA) 108 (90 Base) MCG/ACT inhaler Inhale 2 puffs into the lungs every 6 (six) hours as needed for wheezing or shortness of breath. 1 each 5   amantadine  (SYMMETREL ) 100 MG capsule Take 100 mg by mouth 2 (two) times daily.     blood glucose meter  kit and supplies Dispense  based on patient and insurance preference. Use up to four times daily as directed. (FOR ICD-10 E10.9, E11.9). 1 each 0   Budeson-Glycopyrrol-Formoterol  (BREZTRI  AEROSPHERE) 160-9-4.8 MCG/ACT AERO Inhale 2 puffs into the lungs in the morning and at bedtime. 10.7 g 2   buPROPion  (WELLBUTRIN  XL) 300 MG 24 hr tablet Take 300 mg by mouth daily.     Cholecalciferol  (VITAMIN D -3) 125 MCG (5000 UT) TABS Take 5,000 Units by mouth daily. 30 tablet 1   clonazePAM (KLONOPIN) 0.5 MG tablet Take 1 mg by mouth 2 (two) times daily as needed.     dexamethasone  (DECADRON ) 4 MG tablet Take 2 tablets (8 mg total) by mouth See admin instructions. Take 8mg  daily for 2 days after each chemotherapy. 30 tablet 1   diltiazem  (CARDIZEM  CD) 120 MG 24 hr capsule TAKE 1 CAPSULE BY MOUTH DAILY 90 capsule 1   docusate sodium  (COLACE) 100 MG capsule TAKE 1 CAPSULE BY MOUTH TWICE DAILY 60 capsule 2   FARXIGA  10 MG TABS tablet TAKE 1 TABLET BY MOUTH DAILY BEFORE BREAKFAST 90 tablet 0   gabapentin  (NEURONTIN ) 300 MG capsule Take 300 mg by mouth 2 (two) times daily.     icosapent  Ethyl (VASCEPA ) 1 g capsule Take 2 capsules (2 g total) by mouth in the morning and at bedtime. 120 capsule 2   levothyroxine  (SYNTHROID ) 75 MCG tablet TAKE 1 TABLET BY MOUTH DAILY BEFORE BREAKFAST 90 tablet 2   lidocaine -prilocaine  (EMLA ) cream Apply to affected area once 30 g 3   loperamide  (IMODIUM ) 2 MG capsule Take 1 capsule (2 mg total) by mouth See admin instructions. Initial: 4 mg,the 2 mg every 2 hours (4 mg every 4 hours at night)  maximum: 16 mg/day 120 capsule 2   loratadine  (CLARITIN ) 10 MG tablet Take 10 mg by mouth every morning.     magic mouthwash w/lidocaine  SOLN Take 5 mLs by mouth 4 (four) times daily as needed for mouth pain. Sig: Swish/Swallow 5-10 ml four times a day as needed. Dispense 480 ml. 1RF 480 mL 1   ondansetron  (ZOFRAN ) 8 MG tablet Take 1 tablet (8 mg total) by mouth every 8 (eight) hours as needed for nausea, vomiting or  refractory nausea / vomiting. Start on the third day after chemotherapy. 30 tablet 1   paliperidone  (INVEGA  SUSTENNA) 234 MG/1.5ML SUSY injection Inject 234 mg into the muscle every 30 (thirty) days. On or about the 14th of each month     pantoprazole  (PROTONIX ) 40 MG tablet TAKE 1 TABLET BY MOUTH DAILY 90 tablet 2   pioglitazone  (ACTOS ) 15 MG tablet Take 1 tablet (15 mg total) by mouth daily. 90 tablet 1   potassium chloride  SA (KLOR-CON  M) 20 MEQ tablet Take 1 tablet (20 mEq total) by mouth daily. 30 tablet 0   promethazine  (PHENERGAN ) 12.5 MG tablet Take 1 tablet (12.5 mg total) by mouth every 6 (six) hours as needed for nausea or vomiting. 30 tablet 0   rosuvastatin  (CRESTOR ) 40 MG tablet TAKE 1 TABLET BY MOUTH EVERY MORNING 90 tablet 0   sertraline  (ZOLOFT ) 100 MG tablet Take 200 mg by mouth every morning.     Spacer/Aero-Holding Chambers (AEROCHAMBER MV) inhaler Use as instructed 1 each 0   Vibegron  (GEMTESA ) 75 MG TABS Take 1 tablet (75 mg total) by mouth daily.     No current facility-administered medications for this visit.   Facility-Administered Medications Ordered in Other Visits  Medication Dose Route Frequency Provider Last  Rate Last Admin   albuterol  (PROVENTIL ) (2.5 MG/3ML) 0.083% nebulizer solution 2.5 mg  2.5 mg Nebulization Once Dgayli, Khabib, MD       sodium chloride  flush (NS) 0.9 % injection 10 mL  10 mL Intracatheter PRN Babara Call, MD   10 mL at 08/15/23 1327   sodium chloride  flush (NS) 0.9 % injection 10 mL  10 mL Intracatheter PRN Babara Call, MD   10 mL at 03/26/24 1255     PHYSICAL EXAMINATION: ECOG PERFORMANCE STATUS: 0 - Asymptomatic Vitals:   08/27/24 1123  BP: 118/79  Pulse: 89  Resp: 18  Temp: (!) 97.1 F (36.2 C)  SpO2: 95%     Filed Weights   08/27/24 1123  Weight: 117 lb 9.6 oz (53.3 kg)      Physical Exam Constitutional:      General: She is not in acute distress. HENT:     Head: Normocephalic and atraumatic.  Eyes:     General: No  scleral icterus. Cardiovascular:     Rate and Rhythm: Normal rate and regular rhythm.  Pulmonary:     Effort: Pulmonary effort is normal. No respiratory distress.     Breath sounds: No wheezing.     Comments: Decreased breath sound bilaterally Abdominal:     General: Bowel sounds are normal. There is no distension.     Palpations: Abdomen is soft.     Comments: +colostomy   Musculoskeletal:        General: No deformity. Normal range of motion.     Cervical back: Normal range of motion and neck supple.  Skin:    General: Skin is warm and dry.     Findings: No erythema or rash.  Neurological:     Mental Status: She is alert and oriented to person, place, and time. Mental status is at baseline.     Cranial Nerves: No cranial nerve deficit.     Coordination: Coordination normal.  Psychiatric:        Mood and Affect: Mood normal.     LABORATORY DATA:  I have reviewed the data as listed    Latest Ref Rng & Units 08/27/2024   11:11 AM 08/13/2024    8:50 AM 07/30/2024    8:33 AM  CBC  WBC 4.0 - 10.5 K/uL 6.3  5.0  6.1   Hemoglobin 12.0 - 15.0 g/dL 85.1  86.7  86.7   Hematocrit 36.0 - 46.0 % 44.0  38.6  39.5   Platelets 150 - 400 K/uL 116  95  117       Latest Ref Rng & Units 08/27/2024   11:11 AM 08/13/2024    8:50 AM 07/30/2024    8:33 AM  CMP  Glucose 70 - 99 mg/dL 839  842  859   BUN 6 - 20 mg/dL 14  9  14    Creatinine 0.44 - 1.00 mg/dL 8.93  9.17  9.03   Sodium 135 - 145 mmol/L 135  136  138   Potassium 3.5 - 5.1 mmol/L 4.1  3.4  3.8   Chloride 98 - 111 mmol/L 99  105  106   CO2 22 - 32 mmol/L 25  23  24    Calcium  8.9 - 10.3 mg/dL 9.2  9.0  9.1   Total Protein 6.5 - 8.1 g/dL 6.5  6.4  6.3   Total Bilirubin 0.0 - 1.2 mg/dL 0.6  0.6  0.5   Alkaline Phos 38 - 126 U/L 80  74  73  AST 15 - 41 U/L 24  19  27    ALT 0 - 44 U/L 12  11  17          RADIOGRAPHIC STUDIES: I have personally reviewed the radiological images as listed and agreed with the findings in the report. No  results found.

## 2024-08-28 ENCOUNTER — Encounter

## 2024-08-28 ENCOUNTER — Ambulatory Visit

## 2024-08-28 ENCOUNTER — Other Ambulatory Visit

## 2024-08-28 ENCOUNTER — Telehealth: Payer: Self-pay

## 2024-08-28 ENCOUNTER — Ambulatory Visit: Admitting: Oncology

## 2024-08-28 LAB — CEA: CEA: 3.5 ng/mL (ref 0.0–4.7)

## 2024-08-28 NOTE — Telephone Encounter (Signed)
 Referral faxed to [facility] orthopedics. Re: Hip pain.  Fax confirmation received

## 2024-08-29 ENCOUNTER — Telehealth: Payer: Self-pay

## 2024-08-29 ENCOUNTER — Inpatient Hospital Stay

## 2024-08-29 ENCOUNTER — Encounter: Payer: Self-pay | Admitting: Oncology

## 2024-08-29 ENCOUNTER — Telehealth: Payer: Self-pay | Admitting: Oncology

## 2024-08-29 VITALS — BP 123/85 | HR 82 | Temp 97.2°F | Resp 18

## 2024-08-29 DIAGNOSIS — C20 Malignant neoplasm of rectum: Secondary | ICD-10-CM

## 2024-08-29 MED ORDER — SODIUM CHLORIDE 0.9% FLUSH
10.0000 mL | INTRAVENOUS | Status: DC | PRN
  Administered 2024-08-29: 10 mL
  Filled 2024-08-29: qty 10

## 2024-08-29 NOTE — Telephone Encounter (Signed)
 Called patient to confirm transportation. Didn't get a answer. Also sent a uber to pick up patient and patient was a no show for ride.

## 2024-08-29 NOTE — Telephone Encounter (Signed)
 [facility] to have pump removed. She was dropped off by her neighbor and informed the infusion team that we needed to request her a Gisele. We are currently waiting for someone to arrive.

## 2024-08-29 NOTE — Telephone Encounter (Signed)
 April Luna was a no show for her pump dc today. Multiple attempts to reach the patient throughout the day were not successful. We also attempted to reach the patients emergency contacts and none of the phone numbers listed are in working order. Even though we were unable to speak with the patient, we still requested a April Luna to her home to transport the patient to the facility for the pump removal. Pt did not answer [employer] text or call so she was marked as a no show. We reviewed April Luna chart to see if she has been admitted to any facility in Care Everywhere and patient PING, but no results were matched. We will follow-up with the patient on Monday morning.

## 2024-08-31 ENCOUNTER — Other Ambulatory Visit: Payer: Self-pay

## 2024-09-01 ENCOUNTER — Inpatient Hospital Stay

## 2024-09-01 ENCOUNTER — Ambulatory Visit
Admission: RE | Admit: 2024-09-01 | Discharge: 2024-09-01 | Disposition: A | Discharge status: Home / Self Care | Source: Ambulatory Visit | Attending: Oncology | Admitting: Oncology

## 2024-09-01 DIAGNOSIS — C20 Malignant neoplasm of rectum: Secondary | ICD-10-CM | POA: Diagnosis present

## 2024-09-01 MED ORDER — IOHEXOL 300 MG/ML  SOLN
80.0000 mL | Freq: Once | INTRAMUSCULAR | Status: AC | PRN
  Administered 2024-09-01: 100 mL via INTRAVENOUS

## 2024-09-04 ENCOUNTER — Ambulatory Visit: Payer: 59

## 2024-09-04 DIAGNOSIS — Z0000 Encounter for general adult medical examination without abnormal findings: Secondary | ICD-10-CM | POA: Diagnosis not present

## 2024-09-04 DIAGNOSIS — Z599 Problem related to housing and economic circumstances, unspecified: Secondary | ICD-10-CM

## 2024-09-04 DIAGNOSIS — Z1231 Encounter for screening mammogram for malignant neoplasm of breast: Secondary | ICD-10-CM

## 2024-09-04 NOTE — Patient Instructions (Addendum)
 April Luna,  Thank you for taking the time for your Medicare Wellness Visit. I appreciate your continued commitment to your health goals. Please review the care plan we discussed, and feel free to reach out if I can assist you further.  Medicare recommends these wellness visits once per year to help you and your care team stay ahead of potential health issues. These visits are designed to focus on prevention, allowing your provider to concentrate on managing your acute and chronic conditions during your regular appointments.  Please note that Annual Wellness Visits do not include a physical exam. Some assessments may be limited, especially if the visit was conducted virtually. If needed, we may recommend a separate in-person follow-up with your provider.  Ongoing Care Seeing your primary care provider every 3 to 6 months helps us  monitor your health and provide consistent, personalized care.   Referrals If a referral was made during today's visit and you haven't received any updates within two weeks, please contact the referred provider directly to check on the status.  Recommended Screenings:  Health Maintenance  Topic Date Due   Eye exam for diabetics  Never done   Hepatitis B Vaccine (1 of 3 - 19+ 3-dose series) Never done   Pneumococcal Vaccine for age over 12 (2 of 2 - PCV) 05/15/2019   Zoster (Shingles) Vaccine (2 of 2) 04/11/2021   Pap Smear  02/14/2023   Yearly kidney health urinalysis for diabetes  06/03/2024   Flu Shot  07/25/2024   Hemoglobin A1C  08/04/2024   Breast Cancer Screening  02/04/2025*   Complete foot exam   10/08/2024   DTaP/Tdap/Td vaccine (2 - Td or Tdap) 06/29/2025   Yearly kidney function blood test for diabetes  08/27/2025   Medicare Annual Wellness Visit  09/04/2025   Colon Cancer Screening  02/10/2032   Hepatitis C Screening  Completed   HIV Screening  Completed   HPV Vaccine  Aged Out   Meningitis B Vaccine  Aged Out   COVID-19 Vaccine  Discontinued   *Topic was postponed. The date shown is not the original due date.       09/04/2024   12:53 PM  Advanced Directives  Does Patient Have a Medical Advance Directive? No  Would patient like information on creating a medical advance directive? No - Patient declined   Advance Care Planning is important because it: Ensures you receive medical care that aligns with your values, goals, and preferences. Provides guidance to your family and loved ones, reducing the emotional burden of decision-making during critical moments.  Vision: Annual vision screenings are recommended for early detection of glaucoma, cataracts, and diabetic retinopathy. These exams can also reveal signs of chronic conditions such as diabetes and high blood pressure.  Dental: Annual dental screenings help detect early signs of oral cancer, gum disease, and other conditions linked to overall health, including heart disease and diabetes.  Please see the attached documents for additional preventive care recommendations.   NEXT AWV 09/10/25 @ 12:40 PM BY PHONE

## 2024-09-04 NOTE — Progress Notes (Signed)
 Subjective:   April Luna is a 53 y.o. who presents for a Medicare Wellness preventive visit.  As a reminder, Annual Wellness Visits don't include a physical exam, and some assessments may be limited, especially if this visit is performed virtually. We may recommend an in-person follow-up visit with your provider if needed.  Visit Complete: Virtual I connected with  April Luna on 09/04/24 by a audio enabled telemedicine application and verified that I am speaking with the correct person using two identifiers.  Patient Location: Home  Provider Location: Home Office  I discussed the limitations of evaluation and management by telemedicine. The patient expressed understanding and agreed to proceed.  Vital Signs: Because this visit was a virtual/telehealth visit, some criteria may be missing or patient reported. Any vitals not documented were not able to be obtained and vitals that have been documented are patient reported.  VideoDeclined- This patient declined Librarian, academic. Therefore the visit was completed with audio only.  Persons Participating in Visit: Patient.  AWV Questionnaire: No: Patient Medicare AWV questionnaire was not completed prior to this visit.  Cardiac Risk Factors include: advanced age (>17men, >74 women);microalbuminuria;dyslipidemia;hypertension;diabetes mellitus;sedentary lifestyle;smoking/ tobacco exposure     Objective:    Today's Vitals   09/04/24 1244  PainSc: 4    There is no height or weight on file to calculate BMI.     09/04/2024   12:53 PM 08/13/2024    9:11 AM 07/16/2024    9:00 AM 07/16/2024    8:33 AM 04/21/2024    9:00 AM 04/21/2024    8:55 AM 03/10/2024    8:58 AM  Advanced Directives  Does Patient Have a Medical Advance Directive? No No No No No No No  Would patient like information on creating a medical advance directive? No - Patient declined  No - Patient declined  No - Patient declined No  - Patient declined No - Patient declined    Current Medications (verified) Outpatient Encounter Medications as of 09/04/2024  Medication Sig   blood glucose meter kit and supplies Dispense based on patient and insurance preference. Use up to four times daily as directed. (FOR ICD-10 E10.9, E11.9).   paliperidone  (INVEGA  SUSTENNA) 234 MG/1.5ML SUSY injection Inject 234 mg into the muscle every 30 (thirty) days. On or about the 14th of each month   acetaminophen  (TYLENOL ) 325 MG tablet Take 650 mg by mouth every 6 (six) hours as needed for headache (pain). (Patient not taking: Reported on 09/04/2024)   albuterol  (VENTOLIN  HFA) 108 (90 Base) MCG/ACT inhaler Inhale 2 puffs into the lungs every 6 (six) hours as needed for wheezing or shortness of breath. (Patient not taking: Reported on 09/04/2024)   amantadine  (SYMMETREL ) 100 MG capsule Take 100 mg by mouth 2 (two) times daily. (Patient not taking: Reported on 09/04/2024)   Budeson-Glycopyrrol-Formoterol  (BREZTRI  AEROSPHERE) 160-9-4.8 MCG/ACT AERO Inhale 2 puffs into the lungs in the morning and at bedtime. (Patient not taking: Reported on 09/04/2024)   buPROPion  (WELLBUTRIN  XL) 300 MG 24 hr tablet Take 300 mg by mouth daily. (Patient not taking: Reported on 09/04/2024)   Cholecalciferol  (VITAMIN D -3) 125 MCG (5000 UT) TABS Take 5,000 Units by mouth daily. (Patient not taking: Reported on 09/04/2024)   clonazePAM (KLONOPIN) 0.5 MG tablet Take 1 mg by mouth 2 (two) times daily as needed. (Patient not taking: Reported on 09/04/2024)   dexamethasone  (DECADRON ) 4 MG tablet Take 2 tablets (8 mg total) by mouth See admin instructions. Take 8mg   daily for 2 days after each chemotherapy. (Patient not taking: Reported on 09/04/2024)   diltiazem  (CARDIZEM  CD) 120 MG 24 hr capsule TAKE 1 CAPSULE BY MOUTH DAILY (Patient not taking: Reported on 09/04/2024)   docusate sodium  (COLACE) 100 MG capsule TAKE 1 CAPSULE BY MOUTH TWICE DAILY (Patient not taking: Reported on 09/04/2024)    FARXIGA  10 MG TABS tablet TAKE 1 TABLET BY MOUTH DAILY BEFORE BREAKFAST (Patient not taking: Reported on 09/04/2024)   gabapentin  (NEURONTIN ) 300 MG capsule Take 300 mg by mouth 2 (two) times daily. (Patient not taking: Reported on 09/04/2024)   icosapent  Ethyl (VASCEPA ) 1 g capsule Take 2 capsules (2 g total) by mouth in the morning and at bedtime. (Patient not taking: Reported on 09/04/2024)   levothyroxine  (SYNTHROID ) 75 MCG tablet TAKE 1 TABLET BY MOUTH DAILY BEFORE BREAKFAST (Patient not taking: Reported on 09/04/2024)   lidocaine -prilocaine  (EMLA ) cream Apply to affected area once (Patient not taking: Reported on 09/04/2024)   loperamide  (IMODIUM ) 2 MG capsule Take 1 capsule (2 mg total) by mouth See admin instructions. Initial: 4 mg,the 2 mg every 2 hours (4 mg every 4 hours at night)  maximum: 16 mg/day (Patient not taking: Reported on 09/04/2024)   loratadine  (CLARITIN ) 10 MG tablet Take 10 mg by mouth every morning. (Patient not taking: Reported on 09/04/2024)   magic mouthwash w/lidocaine  SOLN Take 5 mLs by mouth 4 (four) times daily as needed for mouth pain. Sig: Swish/Swallow 5-10 ml four times a day as needed. Dispense 480 ml. 1RF (Patient not taking: Reported on 09/04/2024)   ondansetron  (ZOFRAN ) 8 MG tablet Take 1 tablet (8 mg total) by mouth every 8 (eight) hours as needed for nausea, vomiting or refractory nausea / vomiting. Start on the third day after chemotherapy. (Patient not taking: Reported on 09/04/2024)   pantoprazole  (PROTONIX ) 40 MG tablet TAKE 1 TABLET BY MOUTH DAILY (Patient not taking: Reported on 09/04/2024)   pioglitazone  (ACTOS ) 15 MG tablet Take 1 tablet (15 mg total) by mouth daily. (Patient not taking: Reported on 09/04/2024)   potassium chloride  SA (KLOR-CON  M) 20 MEQ tablet Take 1 tablet (20 mEq total) by mouth daily. (Patient not taking: Reported on 09/04/2024)   promethazine  (PHENERGAN ) 12.5 MG tablet Take 1 tablet (12.5 mg total) by mouth every 6 (six) hours as needed for  nausea or vomiting. (Patient not taking: Reported on 09/04/2024)   rosuvastatin  (CRESTOR ) 40 MG tablet TAKE 1 TABLET BY MOUTH EVERY MORNING (Patient not taking: Reported on 09/04/2024)   sertraline  (ZOLOFT ) 100 MG tablet Take 200 mg by mouth every morning. (Patient not taking: Reported on 09/04/2024)   Spacer/Aero-Holding Chambers (AEROCHAMBER MV) inhaler Use as instructed (Patient not taking: Reported on 09/04/2024)   Vibegron  (GEMTESA ) 75 MG TABS Take 1 tablet (75 mg total) by mouth daily. (Patient not taking: Reported on 09/04/2024)   Facility-Administered Encounter Medications as of 09/04/2024  Medication   albuterol  (PROVENTIL ) (2.5 MG/3ML) 0.083% nebulizer solution 2.5 mg   sodium chloride  flush (NS) 0.9 % injection 10 mL   sodium chloride  flush (NS) 0.9 % injection 10 mL    Allergies (verified) Metformin and related, Nsaids, Perphenazine, Sulfa antibiotics, Abilify [aripiprazole], and Penicillins   History: Past Medical History:  Diagnosis Date   Allergy    pollen   Anxiety    Arthritis    right hip   Bipolar 1 disorder (HCC)    Cancer (HCC)    rectal   Chemotherapy-induced neuropathy (HCC) 10/24/2022   Chronic kidney disease  COPD (chronic obstructive pulmonary disease) (HCC)    Depression    Family history of adverse reaction to anesthesia    grand father had a stroke during anesthesia   Family history of breast cancer    Family history of colon cancer    Family history of uterine cancer    GERD (gastroesophageal reflux disease)    History of kidney stones    Hyperlipidemia    Hypertension    Hypothyroidism    Panic attack    Pneumonia    Psoriasis    Sleep apnea 08/11/2021   No CPAP   Type 2 diabetes mellitus with microalbuminuria, without long-term current use of insulin  (HCC) 06/24/2019   Past Surgical History:  Procedure Laterality Date   BREAST BIOPSY Left 12/14/2021   us  bx, venus marker, path pending   CESAREAN SECTION     COLONOSCOPY WITH PROPOFOL  N/A  02/09/2022   Procedure: COLONOSCOPY WITH PROPOFOL ;  Surgeon: Unk Corinn Skiff, MD;  Location: [facility];  Service: Endoscopy;  Laterality: N/A;  sleep apnea   CYSTOSCOPY W/ RETROGRADES Left 11/08/2018   Procedure: CYSTOSCOPY WITH RETROGRADE PYELOGRAM;  Surgeon: Francisca Redell BROCKS, MD;  Location: [facility];  Service: Urology;  Laterality: Left;   CYSTOSCOPY/URETEROSCOPY/HOLMIUM LASER/STENT PLACEMENT Left 11/08/2018   Procedure: CYSTOSCOPY/URETEROSCOPY/HOLMIUM LASER/STENT PLACEMENT;  Surgeon: Francisca Redell BROCKS, MD;  Location: [facility];  Service: Urology;  Laterality: Left;   IR CV LINE INJECTION  06/25/2023   IR IMAGING GUIDED PORT INSERTION  05/11/2022   IR REMOVE CV FIBRIN SHEATH  06/27/2023   MOUTH SURGERY     wisdom teeth extraction   MOUTH SURGERY     teeth removal   POLYPECTOMY N/A 02/09/2022   Procedure: POLYPECTOMY;  Surgeon: Unk Corinn Skiff, MD;  Location: [facility];  Service: Endoscopy;  Laterality: N/A;   XI ROBOTIC ASSISTED LOWER ANTERIOR RESECTION N/A 04/21/2022   Procedure: XI ROBOTIC ASSISTED LOWER ANTERIOR RESECTION WITH COLOSTOMY, BILATERAL TAP BLOCK, ASSESSMENT OF TISSUE PERFUSSION WITH FIREFLY INJECTION;  Surgeon: Teresa Lonni HERO, MD;  Location: [facility];  Service: General;  Laterality: N/A;   Family History  Problem Relation Age of Onset   Depression Mother    Anxiety disorder Mother    Diabetes Mother    Hypertension Mother    Hyperlipidemia Mother    Cancer Mother    Uterine cancer Mother 59   Cervical cancer Mother 47   Colon cancer Father    Depression Brother    Anxiety disorder Brother    Cancer Maternal Aunt        unk types   Diabetes Mellitus II Maternal Grandmother    Hypercholesterolemia Maternal Grandmother    Breast cancer Maternal Grandmother    Cancer Paternal Grandmother    Diabetes Paternal Grandmother    Melanoma Paternal Grandmother    Stomach cancer Paternal Grandmother    Social History   Socioeconomic History    Marital status: Married    Spouse name: Morene Jama Pereyra   Number of children: 1   Years of education: 12   Highest education level: High school graduate  Occupational History   Occupation: unemployed    Comment: disabled  Tobacco Use   Smoking status: Some Days    Current packs/day: 0.25    Average packs/day: 0.3 packs/day for 39.3 years (9.8 ttl pk-yrs)    Types: Cigarettes    Start date: 05/13/1985    Passive exposure: Current   Smokeless tobacco: Never   Tobacco comments:  5-6 cigarettes weekly- 11/07/2022  Vaping Use   Vaping status: Former   Start date: 05/25/2018   Quit date: 09/24/2018  Substance and Sexual Activity   Alcohol use: Not Currently    Alcohol/week: 4.0 standard drinks of alcohol    Types: 4 Glasses of wine per week    Comment: quit ETOH in Feb. 2023   Drug use: Yes    Types: Marijuana    Comment: 2 days ago   Sexual activity: Not Currently    Birth control/protection: Post-menopausal  Other Topics Concern   Not on file  Social History Narrative   Lives with husband and son    Social Drivers of Health   Financial Resource Strain: Medium Risk (09/04/2024)   Overall Financial Resource Strain (CARDIA)    Difficulty of Paying Living Expenses: Somewhat hard  Food Insecurity: Food Insecurity Present (09/04/2024)   Hunger Vital Sign    Worried About Running Out of Food in the Last Year: Sometimes true    Ran Out of Food in the Last Year: Sometimes true  Transportation Needs: No Transportation Needs (09/04/2024)   PRAPARE - Administrator, Civil Service (Medical): No    Lack of Transportation (Non-Medical): No  Physical Activity: Inactive (09/04/2024)   Exercise Vital Sign    Days of Exercise per Week: 0 days    Minutes of Exercise per Session: 0 min  Stress: Stress Concern Present (09/04/2024)   [employer] of Occupational Health - Occupational Stress Questionnaire    Feeling of Stress: To some extent  Social Connections: Moderately  Integrated (09/04/2024)   Social Connection and Isolation Panel    Frequency of Communication with Friends and Family: More than three times a week    Frequency of Social Gatherings with Friends and Family: More than three times a week    Attends Religious Services: More than 4 times per year    Active Member of [employer] or Organizations: No    Attends Banker Meetings: Never    Marital Status: Married    Tobacco Counseling Ready to quit: Not Answered Counseling given: Not Answered Tobacco comments: 5-6 cigarettes weekly- 11/07/2022    Clinical Intake:  Pre-visit preparation completed: Yes  Pain : 0-10 Pain Score: 4  Pain Type: Chronic pain Pain Location: Hip Pain Orientation: Right Pain Descriptors / Indicators: Aching, Constant Pain Onset: More than a month ago Pain Frequency: Constant     BMI - recorded: 23.6 Nutritional Status: BMI of 19-24  Normal Nutritional Risks: None Diabetes: Yes CBG done?: No Did pt. bring in CBG monitor from home?: No  Lab Results  Component Value Date   HGBA1C 7.3 (A) 02/05/2024   HGBA1C 6.2 (A) 10/09/2023   HGBA1C 5.9 (A) 06/04/2023     How often do you need to have someone help you when you read instructions, pamphlets, or other written materials from your doctor or pharmacy?: 1 - Never  Interpreter Needed?: No  Information entered by :: JHONNIE DAS, LPN   Activities of Daily Living    09/04/2024   12:54 PM 10/09/2023    2:02 PM  In your present state of health, do you have any difficulty performing the following activities:  Hearing? 0 0  Vision? 0 0  Difficulty concentrating or making decisions? 1 1  Comment CONCENTRATING   Walking or climbing stairs? 1 1  Dressing or bathing? 0 0  Doing errands, shopping? 1 1  Preparing Food and eating ? N   Using the  Toilet? N   In the past six months, have you accidently leaked urine? N   Do you have problems with loss of bowel control? N   Managing your Medications?  N   Managing your Finances? N   Housekeeping or managing your Housekeeping? N     Patient Care Team: Sowles, Krichna, MD as PCP - General (Family Medicine) Gaston Hamilton, MD as Consulting Physician (Urology) Maurie Rayfield BIRCH, RN as Oncology Nurse Navigator Babara Call, MD as Consulting Physician (Oncology) Lenn Aran, MD as Consulting Physician (Radiation Oncology) Waynard Leavell [facility] (Psychiatry) Teresa Lonni HERO, MD as Consulting Physician (General Surgery) Unk Corinn Skiff, MD as Consulting Physician (Gastroenterology) [facility], [facility] (Optometry)  I have updated your Care Teams any recent Medical Services you may have received from other providers in the past year.     Assessment:   This is a routine wellness examination for [place].  Hearing/Vision screen Hearing Screening - Comments:: NO AIDS Vision Screening - Comments:: NO GLASSES   Goals Addressed             This Visit's Progress    DIET - INCREASE WATER  INTAKE         Depression Screen     09/04/2024   12:50 PM 07/30/2024    9:12 AM 07/16/2024    9:00 AM 07/16/2024    8:36 AM 02/05/2024    1:58 PM 10/09/2023    2:01 PM 08/30/2023    1:18 PM  PHQ 2/9 Scores  PHQ - 2 Score 2 0 0 0 0 0 0  PHQ- 9 Score 4    0 0     Fall Risk     09/04/2024   12:53 PM 02/05/2024    1:58 PM 08/30/2023    1:07 PM 06/04/2023    3:44 PM 04/11/2023   11:32 AM  Fall Risk   Falls in the past year? 0 1 0 0 0  Number falls in past yr: 0 1 0 0 0  Injury with Fall? 0 0 0 0 0  Risk for fall due to : Impaired balance/gait Impaired balance/gait No Fall Risks    Follow up Falls evaluation completed;Falls prevention discussed Falls prevention discussed;Education provided;Falls evaluation completed Education provided;Falls prevention discussed      MEDICARE RISK AT HOME:  Medicare Risk at Home Any stairs in or around the home?: Yes If so, are there any without handrails?: No Home free of  loose throw rugs in walkways, pet beds, electrical cords, etc?: Yes Adequate lighting in your home to reduce risk of falls?: Yes Life alert?: No Use of a cane, walker or w/c?: No Grab bars in the bathroom?: No Shower chair or bench in shower?: No Elevated toilet seat or a handicapped toilet?: No  TIMED UP AND GO:  Was the test performed?  No  Cognitive Function: 6CIT completed    08/25/2022   10:13 AM  MMSE - Mini Mental State Exam  Orientation to time 5  Orientation to Place 5  Registration 3  Attention/ Calculation 5  Recall 3  Language- name 2 objects 2  Language- repeat 1  Language- follow 3 step command 3  Language- read & follow direction 1  Write a sentence 1  Copy design 1  Total score 30        08/30/2023    1:32 PM 06/29/2020    8:35 AM 02/04/2019   12:48 PM  6CIT Screen  What Year? 0  points 0 points 0 points  What month? 0 points 0 points 0 points  What time? 0 points 0 points 0 points  Count back from 20 0 points 0 points 0 points  Months in reverse 0 points 0 points 0 points  Repeat phrase 0 points 0 points 0 points  Total Score 0 points 0 points 0 points    Immunizations Immunization History  Administered Date(s) Administered   Influenza, Seasonal, Injecte, Preservative Fre 10/09/2023   Influenza,inj,Quad PF,6+ Mos 09/02/2018, 09/24/2019, 11/03/2020, 11/07/2022   PPD Test 05/10/2010   Pneumococcal Polysaccharide-23 05/14/2018   Tdap 06/30/2015   Zoster Recombinant(Shingrix) 02/14/2021    Screening Tests Health Maintenance  Topic Date Due   OPHTHALMOLOGY EXAM  Never done   Hepatitis B Vaccines 19-59 Average Risk (1 of 3 - 19+ 3-dose series) Never done   Pneumococcal Vaccine: 50+ Years (2 of 2 - PCV) 05/15/2019   Zoster Vaccines- Shingrix (2 of 2) 04/11/2021   Cervical Cancer Screening (Pap smear)  02/14/2023   Diabetic kidney evaluation - Urine ACR  06/03/2024   Influenza Vaccine  07/25/2024   HEMOGLOBIN A1C  08/04/2024   Mammogram  02/04/2025  (Originally 11/03/2022)   FOOT EXAM  10/08/2024   DTaP/Tdap/Td (2 - Td or Tdap) 06/29/2025   Diabetic kidney evaluation - eGFR measurement  08/27/2025   Medicare Annual Wellness (AWV)  09/04/2025   Colonoscopy  02/10/2032   Hepatitis C Screening  Completed   HIV Screening  Completed   HPV VACCINES  Aged Out   Meningococcal B Vaccine  Aged Out   COVID-19 Vaccine  Discontinued    Health Maintenance Items Addressed: NEEDS 2ND SHINGRIX, PNA- WANTS NO COVID SHOTS; MAMMOGRAM ORDERED; COLONOSCOPY UP TO DATE  Additional Screening:  Vision Screening: Recommended annual ophthalmology exams for early detection of glaucoma and other disorders of the eye. Is the patient up to date with their annual eye exam?  No  Who is the provider or what is the name of the office in which the patient attends annual eye exams? NO ONE- NO REFERRAL WANTED  Dental Screening: Recommended annual dental exams for proper oral hygiene  Community Resource Referral / Chronic Care Management: CRR required this visit?  Yes   CCM required this visit?  No   Plan:    I have personally reviewed and noted the following in the patient's chart:   Medical and social history Use of alcohol, tobacco or illicit drugs  Current medications and supplements including opioid prescriptions. Patient is not currently taking opioid prescriptions. Functional ability and status Nutritional status Physical activity Advanced directives List of other physicians Hospitalizations, surgeries, and ER visits in previous 12 months Vitals Screenings to include cognitive, depression, and falls Referrals and appointments  In addition, I have reviewed and discussed with patient certain preventive protocols, quality metrics, and best practice recommendations. A written personalized care plan for preventive services as well as general preventive health recommendations were provided to patient.   Jhonnie GORMAN Das, LPN   [id number]   After  Visit Summary: (MyChart) Due to this being a telephonic visit, the after visit summary with patients personalized plan was offered to patient via MyChart   Notes: MAMMOGRAM ALREADY ORDERED- REFERRAL FOR FINANCIAL DIFFICULTIES SENT

## 2024-09-05 ENCOUNTER — Other Ambulatory Visit: Payer: Self-pay

## 2024-09-10 ENCOUNTER — Inpatient Hospital Stay (HOSPITAL_BASED_OUTPATIENT_CLINIC_OR_DEPARTMENT_OTHER): Admitting: Oncology

## 2024-09-10 ENCOUNTER — Inpatient Hospital Stay (HOSPITAL_BASED_OUTPATIENT_CLINIC_OR_DEPARTMENT_OTHER): Admitting: Hospice and Palliative Medicine

## 2024-09-10 ENCOUNTER — Inpatient Hospital Stay

## 2024-09-10 ENCOUNTER — Telehealth: Payer: Self-pay

## 2024-09-10 ENCOUNTER — Encounter: Payer: Self-pay | Admitting: Oncology

## 2024-09-10 VITALS — BP 118/87 | HR 79 | Temp 96.0°F | Resp 19

## 2024-09-10 VITALS — BP 112/84 | HR 96 | Temp 96.4°F | Resp 18 | Wt 119.2 lb

## 2024-09-10 DIAGNOSIS — G620 Drug-induced polyneuropathy: Secondary | ICD-10-CM | POA: Diagnosis not present

## 2024-09-10 DIAGNOSIS — T451X5A Adverse effect of antineoplastic and immunosuppressive drugs, initial encounter: Secondary | ICD-10-CM

## 2024-09-10 DIAGNOSIS — C20 Malignant neoplasm of rectum: Secondary | ICD-10-CM

## 2024-09-10 DIAGNOSIS — M25551 Pain in right hip: Secondary | ICD-10-CM

## 2024-09-10 DIAGNOSIS — Z5111 Encounter for antineoplastic chemotherapy: Secondary | ICD-10-CM | POA: Diagnosis not present

## 2024-09-10 DIAGNOSIS — R300 Dysuria: Secondary | ICD-10-CM | POA: Diagnosis not present

## 2024-09-10 DIAGNOSIS — E876 Hypokalemia: Secondary | ICD-10-CM

## 2024-09-10 LAB — TOTAL PROTEIN, URINE DIPSTICK: Protein, ur: NEGATIVE mg/dL

## 2024-09-10 LAB — CMP (CANCER CENTER ONLY)
ALT: 10 U/L (ref 0–44)
AST: 21 U/L (ref 15–41)
Albumin: 3.9 g/dL (ref 3.5–5.0)
Alkaline Phosphatase: 80 U/L (ref 38–126)
Anion gap: 11 (ref 5–15)
BUN: 15 mg/dL (ref 6–20)
CO2: 24 mmol/L (ref 22–32)
Calcium: 9.1 mg/dL (ref 8.9–10.3)
Chloride: 102 mmol/L (ref 98–111)
Creatinine: 1.09 mg/dL — ABNORMAL HIGH (ref 0.44–1.00)
GFR, Estimated: >60 mL/min (ref >60)
Glucose, Bld: 123 mg/dL — ABNORMAL HIGH (ref 70–99)
Potassium: 4.2 mmol/L (ref 3.5–5.1)
Sodium: 137 mmol/L (ref 135–145)
Total Bilirubin: 0.5 mg/dL (ref 0.0–1.2)
Total Protein: 6.9 g/dL (ref 6.5–8.1)

## 2024-09-10 LAB — CBC WITH DIFFERENTIAL (CANCER CENTER ONLY)
Abs Immature Granulocytes: 0.01 K/uL (ref 0.00–0.07)
Basophils Absolute: 0 K/uL (ref 0.0–0.1)
Basophils Relative: 1 %
Eosinophils Absolute: 0.1 K/uL (ref 0.0–0.5)
Eosinophils Relative: 2 %
HCT: 42.9 % (ref 36.0–46.0)
Hemoglobin: 14.9 g/dL (ref 12.0–15.0)
Immature Granulocytes: 0 %
Lymphocytes Relative: 11 %
Lymphs Abs: 0.6 K/uL — ABNORMAL LOW (ref 0.7–4.0)
MCH: 34.5 pg — ABNORMAL HIGH (ref 26.0–34.0)
MCHC: 34.7 g/dL (ref 30.0–36.0)
MCV: 99.3 fL (ref 80.0–100.0)
Monocytes Absolute: 0.5 K/uL (ref 0.1–1.0)
Monocytes Relative: 8 %
Neutro Abs: 4.8 K/uL (ref 1.7–7.7)
Neutrophils Relative %: 78 %
Platelet Count: 142 K/uL — ABNORMAL LOW (ref 150–400)
RBC: 4.32 MIL/uL (ref 3.87–5.11)
RDW: 14.8 % (ref 11.5–15.5)
WBC Count: 6 K/uL (ref 4.0–10.5)
nRBC: 0 % (ref 0.0–0.2)

## 2024-09-10 LAB — GENETIC SCREENING ORDER

## 2024-09-10 MED ORDER — ONDANSETRON HCL 4 MG/2ML IJ SOLN
8.0000 mg | INTRAMUSCULAR | Status: DC | PRN
  Administered 2024-09-10: 8 mg via INTRAVENOUS
  Filled 2024-09-10: qty 4

## 2024-09-10 MED ORDER — SODIUM CHLORIDE 0.9 % IV SOLN
2400.0000 mg/m2 | INTRAVENOUS | Status: DC
  Administered 2024-09-10: 3500 mg via INTRAVENOUS
  Filled 2024-09-10: qty 70

## 2024-09-10 MED ORDER — SODIUM CHLORIDE 0.9 % IV SOLN
5.0000 mg/kg | Freq: Once | INTRAVENOUS | Status: AC
  Administered 2024-09-10: 275 mg via INTRAVENOUS
  Filled 2024-09-10: qty 11

## 2024-09-10 MED ORDER — FLUOROURACIL CHEMO INJECTION 2.5 GM/50ML
400.0000 mg/m2 | Freq: Once | INTRAVENOUS | Status: AC
  Administered 2024-09-10: 600 mg via INTRAVENOUS
  Filled 2024-09-10: qty 12

## 2024-09-10 MED ORDER — SODIUM CHLORIDE 0.9 % IV SOLN
400.0000 mg/m2 | Freq: Once | INTRAVENOUS | Status: AC
  Administered 2024-09-10: 596 mg via INTRAVENOUS
  Filled 2024-09-10: qty 29.8

## 2024-09-10 MED ORDER — DEXAMETHASONE SODIUM PHOSPHATE 10 MG/ML IJ SOLN
10.0000 mg | Freq: Once | INTRAMUSCULAR | Status: AC
  Administered 2024-09-10: 10 mg via INTRAVENOUS
  Filled 2024-09-10: qty 1

## 2024-09-10 MED ORDER — SODIUM CHLORIDE 0.9 % IV SOLN
Freq: Once | INTRAVENOUS | Status: AC
  Filled 2024-09-10: qty 250

## 2024-09-10 NOTE — Telephone Encounter (Signed)
 Per Dr. Babara, her potassium level is good. please advise her to hold off potassium supplementation Pt informed.

## 2024-09-10 NOTE — Assessment & Plan Note (Addendum)
 History of Stage III. pT3 pN1b cM0, s/p APR. 05/2023 Stage IV current rectal adenocarcinoma Currently on adjuvant chemotherapy. S/p FOLFOX x 4 cycles S/p concurrent chemotherapy [Xeloda  1300 mg BID] with RT, and then another 4 cycles of adjuvant FOLFOX [dose reduced oxaliplatin  and omit 5-FU bolus 05/2023 CT imaging indicates disease progression. liver biopsy pathology showed metastatic carcinoma--> 06/2023 FOLFIRI + Bevacizumab --> 10/2023 and 02/2024 CT partial response --> 05/2024 CT stable - Shared decision was made to hold off irinotecan  given stable disease--> 08/2024 CT stable.  NGS showed TP53 missense variant, APC frameshift, TCF7L2 Frameshift, FLT3 copy number gain., TMB 4.7, pMMR Wildtype KRAS/NRAS  Labs are reviewed and discussed with patient. Negative Signatera circulating tumor DNA Proceed with 5-Fu + Bevacizumab 

## 2024-09-10 NOTE — Progress Notes (Signed)
 Hematology/Oncology Progress note [phone] [phone]      CHIEF COMPLAINTS/REASON FOR VISIT:  Follow-up for Stage IV  rectal cancer treatments.  ASSESSMENT & PLAN:   Cancer Staging  Rectal cancer [facility]) Staging form: Colon and Rectum, AJCC 8th Edition - Pathologic stage from 05/03/2022: Stage IIIB (pT3, pN1b, cM0) - Signed by Babara Call, MD on 05/03/2022 - Pathologic stage from 06/18/2023: Stage IVA (rpTX, rpN0, rpM1a) - Signed by Babara Call, MD on 06/18/2023   Rectal cancer [facility]) History of Stage III. pT3 pN1b cM0, s/p APR. 05/2023 Stage IV current rectal adenocarcinoma Currently on adjuvant chemotherapy. S/p FOLFOX x 4 cycles S/p concurrent chemotherapy [Xeloda  1300 mg BID] with RT, and then another 4 cycles of adjuvant FOLFOX [dose reduced oxaliplatin  and omit 5-FU bolus 05/2023 CT imaging indicates disease progression. liver biopsy pathology showed metastatic carcinoma--> 06/2023 FOLFIRI + Bevacizumab --> 10/2023 and 02/2024 CT partial response --> 05/2024 CT stable - Shared decision was made to hold off irinotecan  given stable disease--> 08/2024 CT stable.  NGS showed TP53 missense variant, APC frameshift, TCF7L2 Frameshift, FLT3 copy number gain., TMB 4.7, pMMR Wildtype KRAS/NRAS  Labs are reviewed and discussed with patient. Negative Signatera circulating tumor DNA Proceed with 5-Fu + Bevacizumab      Chemotherapy-induced neuropathy (HCC) Grade 2  Continue gabapentin  300mg  BID.   Dysuria Urine culture showed less than 10,000 colonies. No need for antibiotics.  Recommend patient to take Gemtiza  Encounter for antineoplastic chemotherapy Chemotherapy plan as listed above  Hip pain Acute on chronic.  Associated sciatic pain.  I have referred to orthopedics in June and she has not established care yet  Hypokalemia Stable K level. Recommend patient to stop KCL 20meq daily.   Orders Placed This Encounter  Procedures   Total Protein, Urine dipstick     Standing Status:   Future    Expected Date:   09/24/2024    Expiration Date:   09/24/2025   CEA    Standing Status:   Future    Expected Date:   09/24/2024    Expiration Date:   09/24/2025   CBC with Differential (Cancer Center Only)    Standing Status:   Future    Expected Date:   09/24/2024    Expiration Date:   09/24/2025   CMP (Cancer Center only)    Standing Status:   Future    Expected Date:   09/24/2024    Expiration Date:   09/24/2025   Total Protein, Urine dipstick    Standing Status:   Future    Expected Date:   10/08/2024    Expiration Date:   10/08/2025   CEA    Standing Status:   Future    Expected Date:   10/08/2024    Expiration Date:   10/08/2025   CBC with Differential (Cancer Center Only)    Standing Status:   Future    Expected Date:   10/08/2024    Expiration Date:   10/08/2025   CMP (Cancer Center only)    Standing Status:   Future    Expected Date:   10/08/2024    Expiration Date:   10/08/2025    Follow-up 2 week(s)  All questions were answered. The patient knows to call the clinic with any problems, questions or concerns.  Call Babara, MD, PhD [facility] Hematology Oncology 09/10/2024     HISTORY OF PRESENTING ILLNESS:   April Luna is a  53 y.o.  female presents for treatment of rectal cancer  Oncology History  Rectal cancer (HCC)  02/26/2022 Imaging   MRI PELVIS WITHOUT CONTRAST- By imaging, rectal cancer stage:  T1/T2, N0, Mx    03/02/2022 Imaging   CT CHEST AND ABDOMEN WITH CONTRAST 1. No convincing evidence of metastatic disease within the chest or abdomen. 2. Atrophic left kidney with multifocal renal scarring and cortical calcifications as well as nonobstructive renal stones measuring up to 5 mm. 3. Prominent left-sided predominant retroperitoneal lymph nodes measuring up to 8 mm near the level of the renal hilum, overall decreased in size dating back to CT September 19, 2018 and favored reactive related to left renal inflammation. 4.   Aortic Atherosclerosis (ICD10-I70.0).   03/27/2022 Genetic Testing    [facility] CustomNext+RNA cancer panel found no pathogenic mutations.    04/21/2022 Initial Diagnosis   Rectal cancer - baseline CEA 3.6 -02/09/2022, patient had colonoscopy which showed renal mass 10 cm from anal verge.  5 mm polyp in ascending colon.  Removed and retrieved. Pathology showed rectal adenocarcinoma.  The polyp in the ascending colon is a tubular adenoma.  MRI showed cT1/T2N0 disease  -04/21/2022, patient underwent robotic assisted ultralow anterior resection. Pathology showed moderately differentiated adenocarcinoma, 4.5 cm in maximal extent, with focal extension through muscularis propria into perirectal soft tissue.  3 lymph nodes positive for metastatic carcinoma.  Negative margin.  pT3 pN1b, MSI stable.   05/03/2022 Cancer Staging   Staging form: Colon and Rectum, AJCC 8th Edition - Pathologic stage from 05/03/2022: Stage IIIB (pT3, pN1b, cM0) - Signed by Babara Call, MD on 05/03/2022 Stage prefix: Initial diagnosis   05/11/2022 Miscellaneous   Medi port placed by Dr.White   05/26/2022 -  Chemotherapy   FOLFOX Q2 weeks x 4   05/26/2022 - 07/09/2022 Chemotherapy   Patient is on Treatment Plan : COLORECTAL FOLFOX q14d x 8 cycles     05/26/2022 - 12/13/2022 Chemotherapy   Patient is on Treatment Plan : COLORECTAL FOLFOX q14d x 4 months     07/27/2022 -  Chemotherapy   Concurrent chemotherapy- Xeloda   1300mg  BID and radiation.    07/27/2022 - 09/12/2022 Radiation Therapy    concurrent chemotherapy [Xeloda  1300 mg BID] with RT    09/20/2022 Imaging   CT Angiogram chest PET protocol There is no evidence of pulmonary artery embolism. There is no evidence of thoracic aortic dissection.   Small linear patchy alveolar infiltrate is seen in medial segment of right middle lobe suggesting atelectasis/pneumonia.     09/28/2022 - 09/30/2022 Hospital Admission   Admission due to acute respiratory failure with hypoxia, COPD  exacerbation   09/28/2022 Imaging   CT angio chest PE protocol 1. Negative for acute PE or thoracic aortic dissection. 2. New ground-glass infiltrates anteriorly in bilateral upper lobes,may represent atypical edema, infectious or inflammatory process. 3.  Aortic Atherosclerosis     12/22/2022 Imaging   CT chest abdomen pelvis w contrast  Stable exam. No evidence of recurrent or metastatic carcinoma within the chest, abdomen, or pelvis.   Left nephrolithiasis and renal parenchymal atrophy. No evidence of ureteral calculi or hydronephrosis.   Aortic Atherosclerosis   05/31/2023 Imaging   CT chest abdomen pelvis with contrast showed 1. New hypodense 16 mm lesion in the right lobe of the liver with very subtle adjacent 5 mm lesion, suspicious for metastatic disease. 2. Small bilateral pulmonary nodules, some of which were subtly evident on prior examination only in retrospect, some of which demonstrate degree of cavitation, suspicious for metastatic disease. 3. Prominent retroperitoneal lymph  nodes are similar prior. 4. Prior low anterior resection with Hartmann's pouch formation,similar small amount of perirectal/presacral soft tissue and fluid,favored postsurgical/posttreatment change. Suggest continued attention on follow-up imaging. 5. Large volume of formed stool in the colon. 6. Nonobstructive left renal calculi measure under 1 cm.     06/08/2023 Relapse/Recurrence   Ultrasound-guided liver biopsy showed Pathology showed metastatic carcinoma, morphology consistent with patient's clinical history of rectal adenocarcinoma.  [employer] NGS 648 gene panel showed TP53 missense variant, APC frameshift, TCF7L2 frameshift, FLT3 copy number gain, TMB 4.13m/MB MSI stable.  Wildtype KRAS/NRAS  [facility] RNA Seq - ERBB3 overexpressed, VEGFA overexpressed, NRAS overexpressed.     06/18/2023 Cancer Staging   Staging form: Colon and Rectum, AJCC 8th Edition - Pathologic stage from 06/18/2023: Stage  IVA (rpTX, pN0, pM1a) - Signed by Babara Call, MD on 06/18/2023 Stage prefix: Recurrence Total positive nodes: 0   07/02/2023 - 06/04/2024 Chemotherapy   COLORECTAL FOLFIRI + Bevacizumab  q14d      11/13/2023 Imaging   CT chest abdomen pelvis w contrast showed  1. Increase in cavitation with decreased soft tissue involving the scattered bilateral pulmonary nodules, consistent with treatment response. No new suspicious pulmonary nodules or masses. 2. Decrease in size and conspicuity of the hypodense lesions in the right lobe of the liver, consistent with treatment response. No new suspicious hepatic lesion identified. 3. Stable prominent nonspecific retroperitoneal lymph nodes. Suggest continued attention on follow-up imaging. 4. Similar surgical changes of prior low anterior resection with left anterior abdominal wall colostomy. Similar mesorectal/presacral soft tissue, no new suspicious enhancing nodularity. 5. Similar soft tissue nodularity in the bilateral posterior gluteal subcutaneous soft tissues. 6. Moderate volume of formed stool in the colon. Correlate for constipation.   03/03/2024 Imaging   CT chest abdomen pelvis w contrast showed 1. 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 2. No new or progressive disease. 3. Similar prominent but not pathologically sized abdominal retroperitoneal nodes, nonspecific. 4. Status post low anterior resection with descending colostomy. Possible constipation. 5. Age advanced coronary artery atherosclerosis. Recommend assessment of coronary risk factors. 6.  Aortic Atherosclerosis (ICD10-I70.0). 7. Left nephrolithiasis and renal scarring.    05/27/2024 Imaging   CT chest abdomen pelvis w contrast showed 1. Status post abdominoperineal resection with left lower quadrant end colostomy. 2. Unchanged enlarged retroperitoneal lymph nodes. 3. Unchanged small hypodense lesion of the posterior right lobe of the liver. 4. Very  tiny, irregular cavitary nodules are unchanged. 5. New areas of irregular atelectasis or consolidation of the peripheral right lower lobe, nonspecific and most likely infectious or inflammatory. Metastatic disease distinctly not favored. Attention on follow-up. 6. Atrophic, scarred left kidney with nonobstructive calculi of the inferior pole. No hydronephrosis.   Aortic Atherosclerosis (ICD10-I70.0).     06/18/2024 -  Chemotherapy   5-FU and Bevacizumab  maintenance.    09/01/2024 Imaging   CT chest abdomen pelvis w contrast showed 1. Status post abdominoperineal resection with left lower quadrant end colostomy. No evidence of local recurrence. 2. Stable mildly enlarged/prominent retroperitoneal lymph nodes. 3. Stable tiny irregular cavitary pulmonary nodules. 4. Unchanged small hypodense lesion in the posterior right lobe of the liver. 5. No evidence of new or progressive disease in the chest, abdomen or pelvis. 6. Aortic atherosclerosis.   Aortic Atherosclerosis (ICD10-I70.0).     Patient has bipolar and schizophrenia..  Patient is married  She has housing issue due to previous history of eviction.  she does not live with her husband currently. She lives with some  family members.   + chronic dysuria and urgency she was seen by Urology and was started on Gemteza.  Symptoms are improved.    INTERVAL HISTORY April Luna is a 53 y.o. female who has above history reviewed by me today presents for follow up visit for rectal cancer.   Denies any melena or blood in the stool. Stable neuropathy symptoms. + loose BM, stable baseline.  + dysuria no fever or flank pain. Patient has lost weight since last visit.  Appetite is fair.  She has bilateral acute on chronic hip pain which caused her to have insomnia.   Review of Systems  Constitutional:  Positive for fatigue. Negative for appetite change, chills and fever.  HENT:   Negative for hearing loss, mouth sores and voice  change.   Eyes:  Negative for eye problems.  Respiratory:  Negative for chest tightness, cough and shortness of breath.   Cardiovascular:  Negative for chest pain.  Gastrointestinal:  Positive for diarrhea. Negative for abdominal distention, abdominal pain, blood in stool and nausea.       + colostomy   Endocrine: Negative for hot flashes.  Genitourinary:  Negative for difficulty urinating, dysuria and frequency.   Musculoskeletal:  Positive for arthralgias.  Skin:  Negative for itching and rash.  Neurological:  Positive for numbness. Negative for extremity weakness.  Hematological:  Negative for adenopathy.  Psychiatric/Behavioral:  Negative for confusion.     MEDICAL HISTORY:  Past Medical History:  Diagnosis Date   Allergy    pollen   Anxiety    Arthritis    right hip   Bipolar 1 disorder (HCC)    Cancer (HCC)    rectal   Chemotherapy-induced neuropathy (HCC) 10/24/2022   Chronic kidney disease    COPD (chronic obstructive pulmonary disease) (HCC)    Depression    Family history of adverse reaction to anesthesia    grand father had a stroke during anesthesia   Family history of breast cancer    Family history of colon cancer    Family history of uterine cancer    GERD (gastroesophageal reflux disease)    History of kidney stones    Hyperlipidemia    Hypertension    Hypothyroidism    Panic attack    Pneumonia    Psoriasis    Sleep apnea 08/11/2021   No CPAP   Type 2 diabetes mellitus with microalbuminuria, without long-term current use of insulin  (HCC) 06/24/2019    SURGICAL HISTORY: Past Surgical History:  Procedure Laterality Date   BREAST BIOPSY Left 12/14/2021   us  bx, venus marker, path pending   CESAREAN SECTION     COLONOSCOPY WITH PROPOFOL  N/A 02/09/2022   Procedure: COLONOSCOPY WITH PROPOFOL ;  Surgeon: Unk Corinn Skiff, MD;  Location: [facility];  Service: Endoscopy;  Laterality: N/A;  sleep apnea   CYSTOSCOPY W/ RETROGRADES Left  11/08/2018   Procedure: CYSTOSCOPY WITH RETROGRADE PYELOGRAM;  Surgeon: Francisca Redell BROCKS, MD;  Location: [facility];  Service: Urology;  Laterality: Left;   CYSTOSCOPY/URETEROSCOPY/HOLMIUM LASER/STENT PLACEMENT Left 11/08/2018   Procedure: CYSTOSCOPY/URETEROSCOPY/HOLMIUM LASER/STENT PLACEMENT;  Surgeon: Francisca Redell BROCKS, MD;  Location: [facility];  Service: Urology;  Laterality: Left;   IR CV LINE INJECTION  06/25/2023   IR IMAGING GUIDED PORT INSERTION  05/11/2022   IR REMOVE CV FIBRIN SHEATH  06/27/2023   MOUTH SURGERY     wisdom teeth extraction   MOUTH SURGERY     teeth removal   POLYPECTOMY N/A 02/09/2022  Procedure: POLYPECTOMY;  Surgeon: Unk Corinn Skiff, MD;  Location: [facility];  Service: Endoscopy;  Laterality: N/A;   XI ROBOTIC ASSISTED LOWER ANTERIOR RESECTION N/A 04/21/2022   Procedure: XI ROBOTIC ASSISTED LOWER ANTERIOR RESECTION WITH COLOSTOMY, BILATERAL TAP BLOCK, ASSESSMENT OF TISSUE PERFUSSION WITH FIREFLY INJECTION;  Surgeon: Teresa Lonni HERO, MD;  Location: [facility];  Service: General;  Laterality: N/A;    SOCIAL HISTORY: Social History   Socioeconomic History   Marital status: Married    Spouse name: Morene Jama Pereyra   Number of children: 1   Years of education: 12   Highest education level: High school graduate  Occupational History   Occupation: unemployed    Comment: disabled  Tobacco Use   Smoking status: Some Days    Current packs/day: 0.25    Average packs/day: 0.3 packs/day for 39.3 years (9.8 ttl pk-yrs)    Types: Cigarettes    Start date: 05/13/1985    Passive exposure: Current   Smokeless tobacco: Never   Tobacco comments:    5-6 cigarettes weekly- 11/07/2022  Vaping Use   Vaping status: Former   Start date: 05/25/2018   Quit date: 09/24/2018  Substance and Sexual Activity   Alcohol use: Not Currently    Alcohol/week: 4.0 standard drinks of alcohol    Types: 4 Glasses of wine per week    Comment: quit ETOH in Feb. 2023   Drug use: Yes     Types: Marijuana    Comment: 2 days ago   Sexual activity: Not Currently    Birth control/protection: Post-menopausal  Other Topics Concern   Not on file  Social History Narrative   Lives with husband and son    Social Drivers of Health   Financial Resource Strain: Medium Risk (09/04/2024)   Overall Financial Resource Strain (CARDIA)    Difficulty of Paying Living Expenses: Somewhat hard  Food Insecurity: Food Insecurity Present (09/04/2024)   Hunger Vital Sign    Worried About Running Out of Food in the Last Year: Sometimes true    Ran Out of Food in the Last Year: Sometimes true  Transportation Needs: No Transportation Needs (09/04/2024)   PRAPARE - Administrator, Civil Service (Medical): No    Lack of Transportation (Non-Medical): No  Physical Activity: Inactive (09/04/2024)   Exercise Vital Sign    Days of Exercise per Week: 0 days    Minutes of Exercise per Session: 0 min  Stress: Stress Concern Present (09/04/2024)   [employer] of Occupational Health - Occupational Stress Questionnaire    Feeling of Stress: To some extent  Social Connections: Moderately Integrated (09/04/2024)   Social Connection and Isolation Panel    Frequency of Communication with Friends and Family: More than three times a week    Frequency of Social Gatherings with Friends and Family: More than three times a week    Attends Religious Services: More than 4 times per year    Active Member of [employer] or Organizations: No    Attends Banker Meetings: Never    Marital Status: Married  Catering manager Violence: Not At Risk (09/04/2024)   Humiliation, Afraid, Rape, and Kick questionnaire    Fear of Current or Ex-Partner: No    Emotionally Abused: No    Physically Abused: No    Sexually Abused: No    FAMILY HISTORY: Family History  Problem Relation Age of Onset   Depression Mother    Anxiety disorder Mother    Diabetes Mother  Hypertension Mother    Hyperlipidemia  Mother    Cancer Mother    Uterine cancer Mother 45   Cervical cancer Mother 72   Colon cancer Father    Depression Brother    Anxiety disorder Brother    Cancer Maternal Aunt        unk types   Diabetes Mellitus II Maternal Grandmother    Hypercholesterolemia Maternal Grandmother    Breast cancer Maternal Grandmother    Cancer Paternal Grandmother    Diabetes Paternal Grandmother    Melanoma Paternal Grandmother    Stomach cancer Paternal Grandmother     ALLERGIES:  is allergic to metformin and related, nsaids, perphenazine, sulfa antibiotics, abilify [aripiprazole], and penicillins.  MEDICATIONS:  Current Outpatient Medications  Medication Sig Dispense Refill   blood glucose meter kit and supplies Dispense based on patient and insurance preference. Use up to four times daily as directed. (FOR ICD-10 E10.9, E11.9). 1 each 0   paliperidone  (INVEGA  SUSTENNA) 234 MG/1.5ML SUSY injection Inject 234 mg into the muscle every 30 (thirty) days. On or about the 14th of each month     acetaminophen  (TYLENOL ) 325 MG tablet Take 650 mg by mouth every 6 (six) hours as needed for headache (pain). (Patient not taking: Reported on 09/10/2024)     albuterol  (VENTOLIN  HFA) 108 (90 Base) MCG/ACT inhaler Inhale 2 puffs into the lungs every 6 (six) hours as needed for wheezing or shortness of breath. (Patient not taking: Reported on 09/10/2024) 1 each 5   amantadine  (SYMMETREL ) 100 MG capsule Take 100 mg by mouth 2 (two) times daily. (Patient not taking: Reported on 09/10/2024)     Budeson-Glycopyrrol-Formoterol  (BREZTRI  AEROSPHERE) 160-9-4.8 MCG/ACT AERO Inhale 2 puffs into the lungs in the morning and at bedtime. (Patient not taking: Reported on 09/10/2024) 10.7 g 2   buPROPion  (WELLBUTRIN  XL) 300 MG 24 hr tablet Take 300 mg by mouth daily. (Patient not taking: Reported on 09/10/2024)     Cholecalciferol  (VITAMIN D -3) 125 MCG (5000 UT) TABS Take 5,000 Units by mouth daily. (Patient not taking: Reported on  09/10/2024) 30 tablet 1   clonazePAM (KLONOPIN) 0.5 MG tablet Take 1 mg by mouth 2 (two) times daily as needed. (Patient not taking: Reported on 09/10/2024)     dexamethasone  (DECADRON ) 4 MG tablet Take 2 tablets (8 mg total) by mouth See admin instructions. Take 8mg  daily for 2 days after each chemotherapy. (Patient not taking: Reported on 09/10/2024) 30 tablet 1   diltiazem  (CARDIZEM  CD) 120 MG 24 hr capsule TAKE 1 CAPSULE BY MOUTH DAILY (Patient not taking: Reported on 09/10/2024) 90 capsule 1   docusate sodium  (COLACE) 100 MG capsule TAKE 1 CAPSULE BY MOUTH TWICE DAILY (Patient not taking: Reported on 09/10/2024) 60 capsule 2   FARXIGA  10 MG TABS tablet TAKE 1 TABLET BY MOUTH DAILY BEFORE BREAKFAST (Patient not taking: Reported on 09/10/2024) 90 tablet 0   gabapentin  (NEURONTIN ) 300 MG capsule Take 300 mg by mouth 2 (two) times daily. (Patient not taking: Reported on 09/10/2024)     icosapent  Ethyl (VASCEPA ) 1 g capsule Take 2 capsules (2 g total) by mouth in the morning and at bedtime. (Patient not taking: Reported on 09/10/2024) 120 capsule 2   levothyroxine  (SYNTHROID ) 75 MCG tablet TAKE 1 TABLET BY MOUTH DAILY BEFORE BREAKFAST (Patient not taking: Reported on 09/10/2024) 90 tablet 2   lidocaine -prilocaine  (EMLA ) cream Apply to affected area once (Patient not taking: Reported on 09/10/2024) 30 g 3   loperamide  (IMODIUM ) 2 MG  capsule Take 1 capsule (2 mg total) by mouth See admin instructions. Initial: 4 mg,the 2 mg every 2 hours (4 mg every 4 hours at night)  maximum: 16 mg/day (Patient not taking: Reported on 09/10/2024) 120 capsule 2   loratadine  (CLARITIN ) 10 MG tablet Take 10 mg by mouth every morning. (Patient not taking: Reported on 09/10/2024)     magic mouthwash w/lidocaine  SOLN Take 5 mLs by mouth 4 (four) times daily as needed for mouth pain. Sig: Swish/Swallow 5-10 ml four times a day as needed. Dispense 480 ml. 1RF (Patient not taking: Reported on 09/10/2024) 480 mL 1   ondansetron  (ZOFRAN ) 8 MG  tablet Take 1 tablet (8 mg total) by mouth every 8 (eight) hours as needed for nausea, vomiting or refractory nausea / vomiting. Start on the third day after chemotherapy. (Patient not taking: Reported on 09/10/2024) 30 tablet 1   pantoprazole  (PROTONIX ) 40 MG tablet TAKE 1 TABLET BY MOUTH DAILY (Patient not taking: Reported on 09/10/2024) 90 tablet 2   pioglitazone  (ACTOS ) 15 MG tablet Take 1 tablet (15 mg total) by mouth daily. (Patient not taking: Reported on 09/10/2024) 90 tablet 1   potassium chloride  SA (KLOR-CON  M) 20 MEQ tablet Take 1 tablet (20 mEq total) by mouth daily. (Patient not taking: Reported on 09/10/2024) 30 tablet 0   promethazine  (PHENERGAN ) 12.5 MG tablet Take 1 tablet (12.5 mg total) by mouth every 6 (six) hours as needed for nausea or vomiting. (Patient not taking: Reported on 09/10/2024) 30 tablet 0   rosuvastatin  (CRESTOR ) 40 MG tablet TAKE 1 TABLET BY MOUTH EVERY MORNING (Patient not taking: Reported on 09/10/2024) 90 tablet 0   sertraline  (ZOLOFT ) 100 MG tablet Take 200 mg by mouth every morning. (Patient not taking: Reported on 09/10/2024)     Spacer/Aero-Holding Chambers (AEROCHAMBER MV) inhaler Use as instructed (Patient not taking: Reported on 09/10/2024) 1 each 0   Vibegron  (GEMTESA ) 75 MG TABS Take 1 tablet (75 mg total) by mouth daily. (Patient not taking: Reported on 09/10/2024)     No current facility-administered medications for this visit.   Facility-Administered Medications Ordered in Other Visits  Medication Dose Route Frequency Provider Last Rate Last Admin   albuterol  (PROVENTIL ) (2.5 MG/3ML) 0.083% nebulizer solution 2.5 mg  2.5 mg Nebulization Once Dgayli, Khabib, MD       bevacizumab -awwb (MVASI ) 275 mg in sodium chloride  0.9 % 100 mL chemo infusion  5 mg/kg (Order-Specific) Intravenous Once Babara Call, MD 666 mL/hr at 09/10/24 1025 275 mg at 09/10/24 1025   fluorouracil  (ADRUCIL ) 3,500 mg in sodium chloride  0.9 % 80 mL chemo infusion  2,400 mg/m2 (Order-Specific)  Intravenous 1 day or 1 dose Babara Call, MD       fluorouracil  (ADRUCIL ) chemo injection 600 mg  400 mg/m2 (Order-Specific) Intravenous Once Babara Call, MD       leucovorin  596 mg in sodium chloride  0.9 % 250 mL infusion  400 mg/m2 (Order-Specific) Intravenous Once Datrell Dunton, MD       ondansetron  (ZOFRAN ) injection 8 mg  8 mg Intravenous PRN Jaydenn Boccio, MD   8 mg at 09/10/24 0955   sodium chloride  flush (NS) 0.9 % injection 10 mL  10 mL Intracatheter PRN Babara Call, MD   10 mL at 08/15/23 1327   sodium chloride  flush (NS) 0.9 % injection 10 mL  10 mL Intracatheter PRN Babara Call, MD   10 mL at 03/26/24 1255     PHYSICAL EXAMINATION: ECOG PERFORMANCE STATUS: 0 - Asymptomatic Vitals:  09/10/24 0856  BP: 112/84  Pulse: 96  Resp: 18  Temp: (!) 96.4 F (35.8 C)  SpO2: 98%     Filed Weights   09/10/24 0856  Weight: 119 lb 3.2 oz (54.1 kg)      Physical Exam Constitutional:      General: She is not in acute distress. HENT:     Head: Normocephalic and atraumatic.  Eyes:     General: No scleral icterus. Cardiovascular:     Rate and Rhythm: Normal rate and regular rhythm.  Pulmonary:     Effort: Pulmonary effort is normal. No respiratory distress.     Breath sounds: No wheezing.     Comments: Decreased breath sound bilaterally Abdominal:     General: Bowel sounds are normal. There is no distension.     Palpations: Abdomen is soft.     Comments: +colostomy   Musculoskeletal:        General: No deformity. Normal range of motion.     Cervical back: Normal range of motion and neck supple.  Skin:    General: Skin is warm and dry.     Findings: No erythema or rash.  Neurological:     Mental Status: She is alert and oriented to person, place, and time. Mental status is at baseline.     Cranial Nerves: No cranial nerve deficit.     Coordination: Coordination normal.  Psychiatric:        Mood and Affect: Mood normal.     LABORATORY DATA:  I have reviewed the data as listed    Latest  Ref Rng & Units 09/10/2024    8:39 AM 08/27/2024   11:11 AM 08/13/2024    8:50 AM  CBC  WBC 4.0 - 10.5 K/uL 6.0  6.3  5.0   Hemoglobin 12.0 - 15.0 g/dL 85.0  85.1  86.7   Hematocrit 36.0 - 46.0 % 42.9  44.0  38.6   Platelets 150 - 400 K/uL 142  116  95       Latest Ref Rng & Units 09/10/2024    8:39 AM 08/27/2024   11:11 AM 08/13/2024    8:50 AM  CMP  Glucose 70 - 99 mg/dL 876  839  842   BUN 6 - 20 mg/dL 15  14  9    Creatinine 0.44 - 1.00 mg/dL 8.90  8.93  9.17   Sodium 135 - 145 mmol/L 137  135  136   Potassium 3.5 - 5.1 mmol/L 4.2  4.1  3.4   Chloride 98 - 111 mmol/L 102  99  105   CO2 22 - 32 mmol/L 24  25  23    Calcium  8.9 - 10.3 mg/dL 9.1  9.2  9.0   Total Protein 6.5 - 8.1 g/dL 6.9  6.5  6.4   Total Bilirubin 0.0 - 1.2 mg/dL 0.5  0.6  0.6   Alkaline Phos 38 - 126 U/L 80  80  74   AST 15 - 41 U/L 21  24  19    ALT 0 - 44 U/L 10  12  11          RADIOGRAPHIC STUDIES: I have personally reviewed the radiological images as listed and agreed with the findings in the report. CT CHEST ABDOMEN PELVIS W CONTRAST Result Date: 09/01/2024 CLINICAL DATA:  Rectal cancer, follow-up.  * Tracking Code: BO * EXAM: CT CHEST, ABDOMEN, AND PELVIS WITH CONTRAST TECHNIQUE: Multidetector CT imaging of the chest, abdomen and pelvis was performed following  the standard protocol during bolus administration of intravenous contrast. RADIATION DOSE REDUCTION: This exam was performed according to the departmental dose-optimization program which includes automated exposure control, adjustment of the mA and/or kV according to patient size and/or use of iterative reconstruction technique. CONTRAST:  OMNIPAQUE  IOHEXOL  300 MG/ML  SOLN COMPARISON:  Multiple priors including CT May 27, 2024 . FINDINGS: CT CHEST FINDINGS Cardiovascular: Right chest Port-A-Cath with tip near the superior cavoatrial junction. Aortic atherosclerosis. Normal caliber thoracic aorta. Normal size heart. No significant pericardial  effusion/thickening. Mediastinum/Nodes: No pathologically enlarged mediastinal, hilar or axillary lymph nodes. The esophagus is grossly unremarkable. No suspicious thyroid  nodule. Lungs/Pleura: Stable tiny irregular cavitary nodules. For reference: -medial posterior left lower lobe nodule measures 5 mm on image 83/4 No new suspicious pulmonary nodules or masses. Scattered atelectasis/scarring. Musculoskeletal: No aggressive lytic or blastic lesion of bone. CT ABDOMEN PELVIS FINDINGS Hepatobiliary: Unchanged small hypodense lesion in the posterior right lobe of the liver hepatic segment VII measuring 8 mm on image 49/2. Gallbladder is unremarkable. No biliary ductal dilation. Pancreas: No pancreatic ductal dilation or evidence of acute inflammation. Spleen: No splenomegaly. Adrenals/Urinary Tract: Bilateral adrenal glands appear normal. Right renal cyst. Too small to accurately characterize bilateral renal lesions. No hydronephrosis. Left cortical renal atrophy with multifocal scarring and nonobstructive renal stones. Stomach/Bowel: Stomach is unremarkable for degree of distension. No pathologic dilation of small or large bowel. No evidence of acute bowel inflammation. Status post abdominoperineal resection with left lower quadrant end colostomy. There is similar soft tissue stranding/fluid in the low pelvis and presacral regions. No new suspicious nodularity. Vascular/Lymphatic: Aortic atherosclerosis. Normal caliber abdominal aorta. Smooth IVC contours. Mildly enlarged/prominent retroperitoneal lymph nodes are stable from prior. Previously indexed left periaortic lymph node measures 1 cm in short axis on image 64/2, unchanged. Reproductive: Uterus and bilateral adnexa are unremarkable. Other: Sequela of subcutaneous injections in the bilateral buttocks. Left lower quadrant end colostomy. No significant abdominopelvic free fluid. Musculoskeletal: No aggressive lytic or blastic lesion of bone. Probable postradiation  change in the sacrum. IMPRESSION: 1. Status post abdominoperineal resection with left lower quadrant end colostomy. No evidence of local recurrence. 2. Stable mildly enlarged/prominent retroperitoneal lymph nodes. 3. Stable tiny irregular cavitary pulmonary nodules. 4. Unchanged small hypodense lesion in the posterior right lobe of the liver. 5. No evidence of new or progressive disease in the chest, abdomen or pelvis. 6. Aortic atherosclerosis. Aortic Atherosclerosis (ICD10-I70.0). Electronically Signed   By: Reyes Holder M.D.   On: 09/01/2024 13:57

## 2024-09-10 NOTE — Assessment & Plan Note (Signed)
 Stable K level. Recommend patient to stop KCL 20meq daily.

## 2024-09-10 NOTE — Assessment & Plan Note (Signed)
 Urine culture showed less than 10,000 colonies. No need for antibiotics.  Recommend patient to take Gemtiza

## 2024-09-10 NOTE — Progress Notes (Signed)
 Palliative Medicine [facility] at [facility] [phone] [phone]   Name: April Luna Date: 09/10/2024 MRN: [id number]  DOB: 12/02/1971  Patient Care Team: Sowles, Krichna, MD as PCP - General (Family Medicine) Gaston Hamilton, MD as Consulting Physician (Urology) Maurie Rayfield BIRCH, RN as Oncology Nurse Navigator Babara Call, MD as Consulting Physician (Oncology) Lenn Aran, MD as Consulting Physician (Radiation Oncology) Waynard Leavell [facility] (Psychiatry) Teresa Lonni HERO, MD as Consulting Physician (General Surgery) Unk Corinn Skiff, MD as Consulting Physician (Gastroenterology) Pa, [facility] (Optometry)    REASON FOR CONSULTATION: April Luna is a 53 y.o. female with multiple medical problems including history of stage IV adenocarcinoma of the rectum.  Palliative care was consulted to address goals.  SOCIAL HISTORY:     reports that she has been smoking cigarettes. She started smoking about 39 years ago. She has a 9.8 pack-year smoking history. She has been exposed to tobacco smoke. She has never used smokeless tobacco. She reports that she does not currently use alcohol after a past usage of about 4.0 standard drinks of alcohol per week. She reports current drug use. Drug: Marijuana.  Patient is married lives at home with her husband.  ADVANCE DIRECTIVES:    CODE STATUS: DNR/DNI (MOST form completed on 09/10/2024)  PAST MEDICAL HISTORY: Past Medical History:  Diagnosis Date   Allergy    pollen   Anxiety    Arthritis    right hip   Bipolar 1 disorder (HCC)    Cancer (HCC)    rectal   Chemotherapy-induced neuropathy (HCC) 10/24/2022   Chronic kidney disease    COPD (chronic obstructive pulmonary disease) (HCC)    Depression    Family history of adverse reaction to anesthesia    grand father had a stroke during anesthesia   Family history of breast cancer     Family history of colon cancer    Family history of uterine cancer    GERD (gastroesophageal reflux disease)    History of kidney stones    Hyperlipidemia    Hypertension    Hypothyroidism    Panic attack    Pneumonia    Psoriasis    Sleep apnea 08/11/2021   No CPAP   Type 2 diabetes mellitus with microalbuminuria, without long-term current use of insulin  (HCC) 06/24/2019    PAST SURGICAL HISTORY:  Past Surgical History:  Procedure Laterality Date   BREAST BIOPSY Left 12/14/2021   us  bx, venus marker, path pending   CESAREAN SECTION     COLONOSCOPY WITH PROPOFOL  N/A 02/09/2022   Procedure: COLONOSCOPY WITH PROPOFOL ;  Surgeon: Unk Corinn Skiff, MD;  Location: [facility];  Service: Endoscopy;  Laterality: N/A;  sleep apnea   CYSTOSCOPY W/ RETROGRADES Left 11/08/2018   Procedure: CYSTOSCOPY WITH RETROGRADE PYELOGRAM;  Surgeon: Francisca Redell BROCKS, MD;  Location: [facility];  Service: Urology;  Laterality: Left;   CYSTOSCOPY/URETEROSCOPY/HOLMIUM LASER/STENT PLACEMENT Left 11/08/2018   Procedure: CYSTOSCOPY/URETEROSCOPY/HOLMIUM LASER/STENT PLACEMENT;  Surgeon: Francisca Redell BROCKS, MD;  Location: [facility];  Service: Urology;  Laterality: Left;   IR CV LINE INJECTION  06/25/2023   IR IMAGING GUIDED PORT INSERTION  05/11/2022   IR REMOVE CV FIBRIN SHEATH  06/27/2023   MOUTH SURGERY     wisdom teeth extraction   MOUTH SURGERY     teeth removal   POLYPECTOMY N/A 02/09/2022   Procedure: POLYPECTOMY;  Surgeon: Unk Corinn Skiff, MD;  Location: [facility];  Service: Endoscopy;  Laterality: N/A;   XI ROBOTIC ASSISTED LOWER ANTERIOR RESECTION N/A 04/21/2022   Procedure: XI ROBOTIC ASSISTED LOWER ANTERIOR RESECTION WITH COLOSTOMY, BILATERAL TAP BLOCK, ASSESSMENT OF TISSUE PERFUSSION WITH FIREFLY INJECTION;  Surgeon: Teresa Lonni HERO, MD;  Location: [facility];  Service: General;  Laterality: N/A;    HEMATOLOGY/ONCOLOGY HISTORY:  Oncology History  Rectal cancer (HCC)  02/26/2022  Imaging   MRI PELVIS WITHOUT CONTRAST- By imaging, rectal cancer stage:  T1/T2, N0, Mx    03/02/2022 Imaging   CT CHEST AND ABDOMEN WITH CONTRAST 1. No convincing evidence of metastatic disease within the chest or abdomen. 2. Atrophic left kidney with multifocal renal scarring and cortical calcifications as well as nonobstructive renal stones measuring up to 5 mm. 3. Prominent left-sided predominant retroperitoneal lymph nodes measuring up to 8 mm near the level of the renal hilum, overall decreased in size dating back to CT September 19, 2018 and favored reactive related to left renal inflammation. 4.  Aortic Atherosclerosis (ICD10-I70.0).   03/27/2022 Genetic Testing    [facility]+RNA cancer panel found no pathogenic mutations.    04/21/2022 Initial Diagnosis   Rectal cancer - baseline CEA 3.6 -02/09/2022, patient had colonoscopy which showed renal mass 10 cm from anal verge.  5 mm polyp in ascending colon.  Removed and retrieved. Pathology showed rectal adenocarcinoma.  The polyp in the ascending colon is a tubular adenoma.  MRI showed cT1/T2N0 disease  -04/21/2022, patient underwent robotic assisted ultralow anterior resection. Pathology showed moderately differentiated adenocarcinoma, 4.5 cm in maximal extent, with focal extension through muscularis propria into perirectal soft tissue.  3 lymph nodes positive for metastatic carcinoma.  Negative margin.  pT3 pN1b, MSI stable.   05/03/2022 Cancer Staging   Staging form: Colon and Rectum, AJCC 8th Edition - Pathologic stage from 05/03/2022: Stage IIIB (pT3, pN1b, cM0) - Signed by Babara Call, MD on 05/03/2022 Stage prefix: Initial diagnosis   05/11/2022 Miscellaneous   Medi port placed by Dr.White   05/26/2022 -  Chemotherapy   FOLFOX Q2 weeks x 4   05/26/2022 - 07/09/2022 Chemotherapy   Patient is on Treatment Plan : COLORECTAL FOLFOX q14d x 8 cycles     05/26/2022 - 12/13/2022 Chemotherapy   Patient is on Treatment Plan : COLORECTAL  FOLFOX q14d x 4 months     07/27/2022 -  Chemotherapy   Concurrent chemotherapy- Xeloda   1300mg  BID and radiation.    07/27/2022 - 09/12/2022 Radiation Therapy    concurrent chemotherapy [Xeloda  1300 mg BID] with RT    09/20/2022 Imaging   CT Angiogram chest PET protocol There is no evidence of pulmonary artery embolism. There is no evidence of thoracic aortic dissection.   Small linear patchy alveolar infiltrate is seen in medial segment of right middle lobe suggesting atelectasis/pneumonia.     09/28/2022 - 09/30/2022 Hospital Admission   Admission due to acute respiratory failure with hypoxia, COPD exacerbation   09/28/2022 Imaging   CT angio chest PE protocol 1. Negative for acute PE or thoracic aortic dissection. 2. New ground-glass infiltrates anteriorly in bilateral upper lobes,may represent atypical edema, infectious or inflammatory process. 3.  Aortic Atherosclerosis     12/22/2022 Imaging   CT chest abdomen pelvis w contrast  Stable exam. No evidence of recurrent or metastatic carcinoma within the chest, abdomen, or pelvis.   Left nephrolithiasis and renal parenchymal atrophy. No evidence of ureteral calculi or hydronephrosis.   Aortic Atherosclerosis   05/31/2023 Imaging   CT chest  abdomen pelvis with contrast showed 1. New hypodense 16 mm lesion in the right lobe of the liver with very subtle adjacent 5 mm lesion, suspicious for metastatic disease. 2. Small bilateral pulmonary nodules, some of which were subtly evident on prior examination only in retrospect, some of which demonstrate degree of cavitation, suspicious for metastatic disease. 3. Prominent retroperitoneal lymph nodes are similar prior. 4. Prior low anterior resection with Hartmann's pouch formation,similar small amount of perirectal/presacral soft tissue and fluid,favored postsurgical/posttreatment change. Suggest continued attention on follow-up imaging. 5. Large volume of formed stool in the colon. 6.  Nonobstructive left renal calculi measure under 1 cm.     06/08/2023 Relapse/Recurrence   Ultrasound-guided liver biopsy showed Pathology showed metastatic carcinoma, morphology consistent with patient's clinical history of rectal adenocarcinoma.  [employer] NGS 648 gene panel showed TP53 missense variant, APC frameshift, TCF7L2 frameshift, FLT3 copy number gain, TMB 4.64m/MB MSI stable.  Wildtype KRAS/NRAS  [employer] RNA Seq - ERBB3 overexpressed, VEGFA overexpressed, NRAS overexpressed.     06/18/2023 Cancer Staging   Staging form: Colon and Rectum, AJCC 8th Edition - Pathologic stage from 06/18/2023: Stage IVA (rpTX, pN0, pM1a) - Signed by Babara Call, MD on 06/18/2023 Stage prefix: Recurrence Total positive nodes: 0   07/02/2023 - 06/04/2024 Chemotherapy   COLORECTAL FOLFIRI + Bevacizumab  q14d      11/13/2023 Imaging   CT chest abdomen pelvis w contrast showed  1. Increase in cavitation with decreased soft tissue involving the scattered bilateral pulmonary nodules, consistent with treatment response. No new suspicious pulmonary nodules or masses. 2. Decrease in size and conspicuity of the hypodense lesions in the right lobe of the liver, consistent with treatment response. No new suspicious hepatic lesion identified. 3. Stable prominent nonspecific retroperitoneal lymph nodes. Suggest continued attention on follow-up imaging. 4. Similar surgical changes of prior low anterior resection with left anterior abdominal wall colostomy. Similar mesorectal/presacral soft tissue, no new suspicious enhancing nodularity. 5. Similar soft tissue nodularity in the bilateral posterior gluteal subcutaneous soft tissues. 6. Moderate volume of formed stool in the colon. Correlate for constipation.   03/03/2024 Imaging   CT chest abdomen pelvis w contrast showed 1. 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 2. No new or progressive disease. 3. Similar prominent but not pathologically  sized abdominal retroperitoneal nodes, nonspecific. 4. Status post low anterior resection with descending colostomy. Possible constipation. 5. Age advanced coronary artery atherosclerosis. Recommend assessment of coronary risk factors. 6.  Aortic Atherosclerosis (ICD10-I70.0). 7. Left nephrolithiasis and renal scarring.    05/27/2024 Imaging   CT chest abdomen pelvis w contrast showed 1. Status post abdominoperineal resection with left lower quadrant end colostomy. 2. Unchanged enlarged retroperitoneal lymph nodes. 3. Unchanged small hypodense lesion of the posterior right lobe of the liver. 4. Very tiny, irregular cavitary nodules are unchanged. 5. New areas of irregular atelectasis or consolidation of the peripheral right lower lobe, nonspecific and most likely infectious or inflammatory. Metastatic disease distinctly not favored. Attention on follow-up. 6. Atrophic, scarred left kidney with nonobstructive calculi of the inferior pole. No hydronephrosis.   Aortic Atherosclerosis (ICD10-I70.0).     06/18/2024 -  Chemotherapy   5-FU and Bevacizumab  maintenance.    09/01/2024 Imaging   CT chest abdomen pelvis w contrast showed 1. Status post abdominoperineal resection with left lower quadrant end colostomy. No evidence of local recurrence. 2. Stable mildly enlarged/prominent retroperitoneal lymph nodes. 3. Stable tiny irregular cavitary pulmonary nodules. 4. Unchanged small hypodense lesion in the posterior right lobe of  the liver. 5. No evidence of new or progressive disease in the chest, abdomen or pelvis. 6. Aortic atherosclerosis.   Aortic Atherosclerosis (ICD10-I70.0).       ALLERGIES:  is allergic to metformin and related, nsaids, perphenazine, sulfa antibiotics, abilify [aripiprazole], and penicillins.  MEDICATIONS:  Current Outpatient Medications  Medication Sig Dispense Refill   acetaminophen  (TYLENOL ) 325 MG tablet Take 650 mg by mouth every 6 (six) hours as  needed for headache (pain). (Patient not taking: Reported on 09/10/2024)     albuterol  (VENTOLIN  HFA) 108 (90 Base) MCG/ACT inhaler Inhale 2 puffs into the lungs every 6 (six) hours as needed for wheezing or shortness of breath. (Patient not taking: Reported on 09/10/2024) 1 each 5   amantadine  (SYMMETREL ) 100 MG capsule Take 100 mg by mouth 2 (two) times daily. (Patient not taking: Reported on 09/10/2024)     blood glucose meter kit and supplies Dispense based on patient and insurance preference. Use up to four times daily as directed. (FOR ICD-10 E10.9, E11.9). 1 each 0   Budeson-Glycopyrrol-Formoterol  (BREZTRI  AEROSPHERE) 160-9-4.8 MCG/ACT AERO Inhale 2 puffs into the lungs in the morning and at bedtime. (Patient not taking: Reported on 09/10/2024) 10.7 g 2   buPROPion  (WELLBUTRIN  XL) 300 MG 24 hr tablet Take 300 mg by mouth daily. (Patient not taking: Reported on 09/10/2024)     Cholecalciferol  (VITAMIN D -3) 125 MCG (5000 UT) TABS Take 5,000 Units by mouth daily. (Patient not taking: Reported on 09/10/2024) 30 tablet 1   clonazePAM (KLONOPIN) 0.5 MG tablet Take 1 mg by mouth 2 (two) times daily as needed. (Patient not taking: Reported on 09/10/2024)     dexamethasone  (DECADRON ) 4 MG tablet Take 2 tablets (8 mg total) by mouth See admin instructions. Take 8mg  daily for 2 days after each chemotherapy. (Patient not taking: Reported on 09/10/2024) 30 tablet 1   diltiazem  (CARDIZEM  CD) 120 MG 24 hr capsule TAKE 1 CAPSULE BY MOUTH DAILY (Patient not taking: Reported on 09/10/2024) 90 capsule 1   docusate sodium  (COLACE) 100 MG capsule TAKE 1 CAPSULE BY MOUTH TWICE DAILY (Patient not taking: Reported on 09/10/2024) 60 capsule 2   FARXIGA  10 MG TABS tablet TAKE 1 TABLET BY MOUTH DAILY BEFORE BREAKFAST (Patient not taking: Reported on 09/10/2024) 90 tablet 0   gabapentin  (NEURONTIN ) 300 MG capsule Take 300 mg by mouth 2 (two) times daily. (Patient not taking: Reported on 09/10/2024)     icosapent  Ethyl (VASCEPA ) 1 g  capsule Take 2 capsules (2 g total) by mouth in the morning and at bedtime. (Patient not taking: Reported on 09/10/2024) 120 capsule 2   levothyroxine  (SYNTHROID ) 75 MCG tablet TAKE 1 TABLET BY MOUTH DAILY BEFORE BREAKFAST (Patient not taking: Reported on 09/10/2024) 90 tablet 2   lidocaine -prilocaine  (EMLA ) cream Apply to affected area once (Patient not taking: Reported on 09/10/2024) 30 g 3   loperamide  (IMODIUM ) 2 MG capsule Take 1 capsule (2 mg total) by mouth See admin instructions. Initial: 4 mg,the 2 mg every 2 hours (4 mg every 4 hours at night)  maximum: 16 mg/day (Patient not taking: Reported on 09/10/2024) 120 capsule 2   loratadine  (CLARITIN ) 10 MG tablet Take 10 mg by mouth every morning. (Patient not taking: Reported on 09/10/2024)     magic mouthwash w/lidocaine  SOLN Take 5 mLs by mouth 4 (four) times daily as needed for mouth pain. Sig: Swish/Swallow 5-10 ml four times a day as needed. Dispense 480 ml. 1RF (Patient not taking: Reported on 09/10/2024) 480 mL  1   ondansetron  (ZOFRAN ) 8 MG tablet Take 1 tablet (8 mg total) by mouth every 8 (eight) hours as needed for nausea, vomiting or refractory nausea / vomiting. Start on the third day after chemotherapy. (Patient not taking: Reported on 09/10/2024) 30 tablet 1   paliperidone  (INVEGA  SUSTENNA) 234 MG/1.5ML SUSY injection Inject 234 mg into the muscle every 30 (thirty) days. On or about the 14th of each month     pantoprazole  (PROTONIX ) 40 MG tablet TAKE 1 TABLET BY MOUTH DAILY (Patient not taking: Reported on 09/10/2024) 90 tablet 2   pioglitazone  (ACTOS ) 15 MG tablet Take 1 tablet (15 mg total) by mouth daily. (Patient not taking: Reported on 09/10/2024) 90 tablet 1   potassium chloride  SA (KLOR-CON  M) 20 MEQ tablet Take 1 tablet (20 mEq total) by mouth daily. (Patient not taking: Reported on 09/10/2024) 30 tablet 0   promethazine  (PHENERGAN ) 12.5 MG tablet Take 1 tablet (12.5 mg total) by mouth every 6 (six) hours as needed for nausea or vomiting.  (Patient not taking: Reported on 09/10/2024) 30 tablet 0   rosuvastatin  (CRESTOR ) 40 MG tablet TAKE 1 TABLET BY MOUTH EVERY MORNING (Patient not taking: Reported on 09/10/2024) 90 tablet 0   sertraline  (ZOLOFT ) 100 MG tablet Take 200 mg by mouth every morning. (Patient not taking: Reported on 09/10/2024)     Spacer/Aero-Holding Chambers (AEROCHAMBER MV) inhaler Use as instructed (Patient not taking: Reported on 09/10/2024) 1 each 0   Vibegron  (GEMTESA ) 75 MG TABS Take 1 tablet (75 mg total) by mouth daily. (Patient not taking: Reported on 09/10/2024)     No current facility-administered medications for this visit.   Facility-Administered Medications Ordered in Other Visits  Medication Dose Route Frequency Provider Last Rate Last Admin   albuterol  (PROVENTIL ) (2.5 MG/3ML) 0.083% nebulizer solution 2.5 mg  2.5 mg Nebulization Once Dgayli, Khabib, MD       bevacizumab -awwb (MVASI ) 275 mg in sodium chloride  0.9 % 100 mL chemo infusion  5 mg/kg (Order-Specific) Intravenous Once Yu, Zhou, MD       fluorouracil  (ADRUCIL ) 3,500 mg in sodium chloride  0.9 % 80 mL chemo infusion  2,400 mg/m2 (Order-Specific) Intravenous 1 day or 1 dose Babara Call, MD       fluorouracil  (ADRUCIL ) chemo injection 600 mg  400 mg/m2 (Order-Specific) Intravenous Once Babara Call, MD       leucovorin  596 mg in sodium chloride  0.9 % 250 mL infusion  400 mg/m2 (Order-Specific) Intravenous Once Yu, Zhou, MD       ondansetron  (ZOFRAN ) injection 8 mg  8 mg Intravenous PRN Yu, Zhou, MD   8 mg at 09/10/24 0955   sodium chloride  flush (NS) 0.9 % injection 10 mL  10 mL Intracatheter PRN Babara Call, MD   10 mL at 08/15/23 1327   sodium chloride  flush (NS) 0.9 % injection 10 mL  10 mL Intracatheter PRN Babara Call, MD   10 mL at 03/26/24 1255    VITAL SIGNS: There were no vitals taken for this visit. There were no vitals filed for this visit.  Estimated body mass index is 24.08 kg/m as calculated from the following:   Height as of 08/13/24: 4' 11  (1.499 m).   Weight as of an earlier encounter on 09/10/24: 119 lb 3.2 oz (54.1 kg).  LABS: CBC:    Component Value Date/Time   WBC 6.0 09/10/2024 0839   WBC 8.2 06/08/2023 0913   HGB 14.9 09/10/2024 0839   HGB 13.2 04/20/2015 0640  HCT 42.9 09/10/2024 0839   HCT 39.2 04/20/2015 0640   PLT 142 (L) 09/10/2024 0839   PLT 165 04/20/2015 0640   MCV 99.3 09/10/2024 0839   MCV 96 04/20/2015 0640   NEUTROABS 4.8 09/10/2024 0839   NEUTROABS 4.2 04/20/2015 0640   LYMPHSABS 0.6 (L) 09/10/2024 0839   LYMPHSABS 2.4 04/20/2015 0640   MONOABS 0.5 09/10/2024 0839   MONOABS 0.6 04/20/2015 0640   EOSABS 0.1 09/10/2024 0839   EOSABS 0.2 04/20/2015 0640   BASOSABS 0.0 09/10/2024 0839   BASOSABS 0.1 04/20/2015 0640   Comprehensive Metabolic Panel:    Component Value Date/Time   NA 137 09/10/2024 0839   NA 139 04/20/2015 0640   K 4.2 09/10/2024 0839   K 3.9 04/20/2015 0640   CL 102 09/10/2024 0839   CL 105 04/20/2015 0640   CO2 24 09/10/2024 0839   CO2 27 04/20/2015 0640   BUN 15 09/10/2024 0839   BUN 12 04/20/2015 0640   CREATININE 1.09 (H) 09/10/2024 0839   CREATININE 1.08 (H) 10/05/2022 1054   GLUCOSE 123 (H) 09/10/2024 0839   GLUCOSE 95 04/20/2015 0640   CALCIUM  9.1 09/10/2024 0839   CALCIUM  9.2 04/20/2015 0640   AST 21 09/10/2024 0839   ALT 10 09/10/2024 0839   ALT 30 04/20/2015 0640   ALKPHOS 80 09/10/2024 0839   ALKPHOS 80 04/20/2015 0640   BILITOT 0.5 09/10/2024 0839   PROT 6.9 09/10/2024 0839   PROT 6.5 04/20/2015 0640   ALBUMIN  3.9 09/10/2024 0839   ALBUMIN  3.7 04/20/2015 0640    RADIOGRAPHIC STUDIES: CT CHEST ABDOMEN PELVIS W CONTRAST Result Date: 09/01/2024 CLINICAL DATA:  Rectal cancer, follow-up.  * Tracking Code: BO * EXAM: CT CHEST, ABDOMEN, AND PELVIS WITH CONTRAST TECHNIQUE: Multidetector CT imaging of the chest, abdomen and pelvis was performed following the standard protocol during bolus administration of intravenous contrast. RADIATION DOSE REDUCTION: This  exam was performed according to the departmental dose-optimization program which includes automated exposure control, adjustment of the mA and/or kV according to patient size and/or use of iterative reconstruction technique. CONTRAST:  OMNIPAQUE  IOHEXOL  300 MG/ML  SOLN COMPARISON:  Multiple priors including CT May 27, 2024 . FINDINGS: CT CHEST FINDINGS Cardiovascular: Right chest Port-A-Cath with tip near the superior cavoatrial junction. Aortic atherosclerosis. Normal caliber thoracic aorta. Normal size heart. No significant pericardial effusion/thickening. Mediastinum/Nodes: No pathologically enlarged mediastinal, hilar or axillary lymph nodes. The esophagus is grossly unremarkable. No suspicious thyroid  nodule. Lungs/Pleura: Stable tiny irregular cavitary nodules. For reference: -medial posterior left lower lobe nodule measures 5 mm on image 83/4 No new suspicious pulmonary nodules or masses. Scattered atelectasis/scarring. Musculoskeletal: No aggressive lytic or blastic lesion of bone. CT ABDOMEN PELVIS FINDINGS Hepatobiliary: Unchanged small hypodense lesion in the posterior right lobe of the liver hepatic segment VII measuring 8 mm on image 49/2. Gallbladder is unremarkable. No biliary ductal dilation. Pancreas: No pancreatic ductal dilation or evidence of acute inflammation. Spleen: No splenomegaly. Adrenals/Urinary Tract: Bilateral adrenal glands appear normal. Right renal cyst. Too small to accurately characterize bilateral renal lesions. No hydronephrosis. Left cortical renal atrophy with multifocal scarring and nonobstructive renal stones. Stomach/Bowel: Stomach is unremarkable for degree of distension. No pathologic dilation of small or large bowel. No evidence of acute bowel inflammation. Status post abdominoperineal resection with left lower quadrant end colostomy. There is similar soft tissue stranding/fluid in the low pelvis and presacral regions. No new suspicious nodularity.  Vascular/Lymphatic: Aortic atherosclerosis. Normal caliber abdominal aorta. Smooth IVC contours. Mildly enlarged/prominent  retroperitoneal lymph nodes are stable from prior. Previously indexed left periaortic lymph node measures 1 cm in short axis on image 64/2, unchanged. Reproductive: Uterus and bilateral adnexa are unremarkable. Other: Sequela of subcutaneous injections in the bilateral buttocks. Left lower quadrant end colostomy. No significant abdominopelvic free fluid. Musculoskeletal: No aggressive lytic or blastic lesion of bone. Probable postradiation change in the sacrum. IMPRESSION: 1. Status post abdominoperineal resection with left lower quadrant end colostomy. No evidence of local recurrence. 2. Stable mildly enlarged/prominent retroperitoneal lymph nodes. 3. Stable tiny irregular cavitary pulmonary nodules. 4. Unchanged small hypodense lesion in the posterior right lobe of the liver. 5. No evidence of new or progressive disease in the chest, abdomen or pelvis. 6. Aortic atherosclerosis. Aortic Atherosclerosis (ICD10-I70.0). Electronically Signed   By: Reyes Holder M.D.   On: 09/01/2024 13:57    PERFORMANCE STATUS (ECOG) : 1 - Symptomatic but completely ambulatory  Review of Systems Unless otherwise noted, a complete review of systems is negative.  Physical Exam General: NAD Cardiovascular: regular rate and rhythm Pulmonary: clear ant fields Abdomen: soft, nontender, + bowel sounds GU: no suprapubic tenderness Extremities: no edema, no joint deformities Skin: no rashes Neurological: Weakness but otherwise nonfocal  IMPRESSION: Patient seen in infusion.  She has stage IV rectal adenocarcinoma currently on adjuvant chemotherapy.  At baseline, patient lives at home with her husband.  She says she is functionally independent with her own care.  Patient feels she is doing reasonably well with treatment.  Denies significant symptomatic burden other than pain in her hip, which he  reports improves with stretching activities.  She is interested in seeing OT or PT for stretches.  Patient recognizes that her cancer is incurable.  She is in agreement with current scope of treatment.  Patient says that she is not interested in resuscitation nor would she want her life prolonged artificially on machines.  She would be in agreement with hospitalization if needed to treat the treatable.  Patient says that she has discussed her wishes with her husband who is in agreement with DNR/DNI.   I completed a MOST form today. The patient and family outlined their wishes for the following treatment decisions:  Cardiopulmonary Resuscitation: Do Not Attempt Resuscitation (DNR/No CPR)  Medical Interventions: Limited Additional Interventions: Use medical treatment, IV fluids and cardiac monitoring as indicated, DO NOT USE intubation or mechanical ventilation. May consider use of less invasive airway support such as BiPAP or CPAP. Also provide comfort measures. Transfer to the hospital if indicated. Avoid intensive care.   Antibiotics: Antibiotics if indicated  IV Fluids: IV fluids for a defined trial period  Feeding Tube: No feeding tube     PLAN: - Continue current scope of treatment - DNR/DNI - MOST Form and DNR forms completed - Follow-up telephone visit 2 to 3 months   Patient expressed understanding and was in agreement with this plan. She also understands that She can call the clinic at any time with any questions, concerns, or complaints.     Time Total: 20 minutes  Visit consisted of counseling and education dealing with the complex and emotionally intense issues of symptom management and palliative care in the setting of serious and potentially life-threatening illness.Greater than 50%  of this time was spent counseling and coordinating care related to the above assessment and plan.  Signed by: Fonda Mower, PhD, NP-C

## 2024-09-10 NOTE — Assessment & Plan Note (Signed)
 Grade 2  Continue gabapentin 300mg  BID.

## 2024-09-10 NOTE — Assessment & Plan Note (Signed)
 Acute on chronic.  Associated sciatic pain.  I have referred to orthopedics in June and she has not established care yet

## 2024-09-10 NOTE — Assessment & Plan Note (Signed)
 Chemotherapy plan as listed above

## 2024-09-11 LAB — CEA: CEA: 4.2 ng/mL (ref 0.0–4.7)

## 2024-09-12 ENCOUNTER — Inpatient Hospital Stay

## 2024-09-12 VITALS — BP 117/76

## 2024-09-12 DIAGNOSIS — C20 Malignant neoplasm of rectum: Secondary | ICD-10-CM

## 2024-09-12 MED ORDER — SODIUM CHLORIDE 0.9% FLUSH
10.0000 mL | INTRAVENOUS | Status: DC | PRN
  Filled 2024-09-12: qty 10

## 2024-09-12 NOTE — Progress Notes (Signed)
 Nutrition Follow-up:  Patient with stage III adenocarcinoma of rectum.  Receiving folfiri and bevacizumab .    Met with patient after pump removal.  Says that she does not have an appetite.  Confirms that she has food available when she wants to eat. Ate a banana this am and chicken pasta last night.  Planning to eat chicken salad when she gets back home.  Likes glucerna shakes.      Medications: reviewed  Labs: reviewed  Anthropometrics:   Weight 119 lb  123 lb 6/10 126 lb on 5/27 129 lb on 4/28   NUTRITION DIAGNOSIS: Inadequate oral intake with weight decreasing    INTERVENTION:  Recommend setting alarm to eat q 2 hours Consider trial of appetite stimulant    MONITORING, EVALUATION, GOAL: weight trends, intake   NEXT VISIT: Wed, Oct 1 during infusion  April Luna SOLON, CSO, LDN Registered Dietitian [phone]

## 2024-09-23 LAB — SIGNATERA
SIGNATERA MTM READOUT: 0.07 MTM/ml — AB
SIGNATERA TEST RESULT: POSITIVE — AB

## 2024-09-24 ENCOUNTER — Encounter

## 2024-09-24 ENCOUNTER — Encounter: Payer: Self-pay | Admitting: Oncology

## 2024-09-24 ENCOUNTER — Inpatient Hospital Stay: Admitting: Occupational Therapy

## 2024-09-24 ENCOUNTER — Inpatient Hospital Stay

## 2024-09-24 ENCOUNTER — Inpatient Hospital Stay (HOSPITAL_BASED_OUTPATIENT_CLINIC_OR_DEPARTMENT_OTHER): Admitting: Oncology

## 2024-09-24 ENCOUNTER — Inpatient Hospital Stay: Attending: Oncology

## 2024-09-24 VITALS — BP 122/90 | HR 99 | Temp 96.0°F | Resp 16 | Wt 117.0 lb

## 2024-09-24 VITALS — BP 117/76 | HR 80

## 2024-09-24 DIAGNOSIS — Z5189 Encounter for other specified aftercare: Secondary | ICD-10-CM | POA: Diagnosis not present

## 2024-09-24 DIAGNOSIS — G620 Drug-induced polyneuropathy: Secondary | ICD-10-CM | POA: Diagnosis not present

## 2024-09-24 DIAGNOSIS — J9601 Acute respiratory failure with hypoxia: Secondary | ICD-10-CM | POA: Insufficient documentation

## 2024-09-24 DIAGNOSIS — J441 Chronic obstructive pulmonary disease with (acute) exacerbation: Secondary | ICD-10-CM | POA: Diagnosis not present

## 2024-09-24 DIAGNOSIS — F319 Bipolar disorder, unspecified: Secondary | ICD-10-CM | POA: Insufficient documentation

## 2024-09-24 DIAGNOSIS — M25551 Pain in right hip: Secondary | ICD-10-CM | POA: Diagnosis not present

## 2024-09-24 DIAGNOSIS — N200 Calculus of kidney: Secondary | ICD-10-CM | POA: Insufficient documentation

## 2024-09-24 DIAGNOSIS — Z923 Personal history of irradiation: Secondary | ICD-10-CM | POA: Insufficient documentation

## 2024-09-24 DIAGNOSIS — Z79899 Other long term (current) drug therapy: Secondary | ICD-10-CM | POA: Diagnosis not present

## 2024-09-24 DIAGNOSIS — K5900 Constipation, unspecified: Secondary | ICD-10-CM | POA: Diagnosis not present

## 2024-09-24 DIAGNOSIS — F209 Schizophrenia, unspecified: Secondary | ICD-10-CM | POA: Insufficient documentation

## 2024-09-24 DIAGNOSIS — Z5112 Encounter for antineoplastic immunotherapy: Secondary | ICD-10-CM | POA: Diagnosis not present

## 2024-09-24 DIAGNOSIS — T451X5A Adverse effect of antineoplastic and immunosuppressive drugs, initial encounter: Secondary | ICD-10-CM | POA: Diagnosis not present

## 2024-09-24 DIAGNOSIS — Z800 Family history of malignant neoplasm of digestive organs: Secondary | ICD-10-CM | POA: Insufficient documentation

## 2024-09-24 DIAGNOSIS — I129 Hypertensive chronic kidney disease with stage 1 through stage 4 chronic kidney disease, or unspecified chronic kidney disease: Secondary | ICD-10-CM | POA: Insufficient documentation

## 2024-09-24 DIAGNOSIS — E876 Hypokalemia: Secondary | ICD-10-CM | POA: Insufficient documentation

## 2024-09-24 DIAGNOSIS — C20 Malignant neoplasm of rectum: Secondary | ICD-10-CM | POA: Diagnosis present

## 2024-09-24 DIAGNOSIS — F17210 Nicotine dependence, cigarettes, uncomplicated: Secondary | ICD-10-CM | POA: Insufficient documentation

## 2024-09-24 DIAGNOSIS — Z933 Colostomy status: Secondary | ICD-10-CM | POA: Insufficient documentation

## 2024-09-24 DIAGNOSIS — I700 Atherosclerosis of aorta: Secondary | ICD-10-CM | POA: Insufficient documentation

## 2024-09-24 DIAGNOSIS — Z7952 Long term (current) use of systemic steroids: Secondary | ICD-10-CM | POA: Insufficient documentation

## 2024-09-24 DIAGNOSIS — Z9221 Personal history of antineoplastic chemotherapy: Secondary | ICD-10-CM | POA: Insufficient documentation

## 2024-09-24 DIAGNOSIS — Z5111 Encounter for antineoplastic chemotherapy: Secondary | ICD-10-CM | POA: Diagnosis not present

## 2024-09-24 DIAGNOSIS — I2510 Atherosclerotic heart disease of native coronary artery without angina pectoris: Secondary | ICD-10-CM | POA: Insufficient documentation

## 2024-09-24 DIAGNOSIS — E1140 Type 2 diabetes mellitus with diabetic neuropathy, unspecified: Secondary | ICD-10-CM | POA: Insufficient documentation

## 2024-09-24 DIAGNOSIS — E1122 Type 2 diabetes mellitus with diabetic chronic kidney disease: Secondary | ICD-10-CM | POA: Diagnosis not present

## 2024-09-24 DIAGNOSIS — G4730 Sleep apnea, unspecified: Secondary | ICD-10-CM | POA: Diagnosis not present

## 2024-09-24 DIAGNOSIS — Z79890 Hormone replacement therapy (postmenopausal): Secondary | ICD-10-CM | POA: Insufficient documentation

## 2024-09-24 DIAGNOSIS — N281 Cyst of kidney, acquired: Secondary | ICD-10-CM | POA: Insufficient documentation

## 2024-09-24 DIAGNOSIS — Z7984 Long term (current) use of oral hypoglycemic drugs: Secondary | ICD-10-CM | POA: Insufficient documentation

## 2024-09-24 DIAGNOSIS — Z803 Family history of malignant neoplasm of breast: Secondary | ICD-10-CM | POA: Insufficient documentation

## 2024-09-24 DIAGNOSIS — M25552 Pain in left hip: Secondary | ICD-10-CM | POA: Insufficient documentation

## 2024-09-24 LAB — CMP (CANCER CENTER ONLY)
ALT: 12 U/L (ref 0–44)
AST: 27 U/L (ref 15–41)
Albumin: 3.9 g/dL (ref 3.5–5.0)
Alkaline Phosphatase: 79 U/L (ref 38–126)
Anion gap: 8 (ref 5–15)
BUN: 10 mg/dL (ref 6–20)
CO2: 22 mmol/L (ref 22–32)
Calcium: 8.8 mg/dL — ABNORMAL LOW (ref 8.9–10.3)
Chloride: 105 mmol/L (ref 98–111)
Creatinine: 1.18 mg/dL — ABNORMAL HIGH (ref 0.44–1.00)
GFR, Estimated: 56 mL/min — ABNORMAL LOW (ref >60)
Glucose, Bld: 160 mg/dL — ABNORMAL HIGH (ref 70–99)
Potassium: 3.7 mmol/L (ref 3.5–5.1)
Sodium: 135 mmol/L (ref 135–145)
Total Bilirubin: 0.7 mg/dL (ref 0.0–1.2)
Total Protein: 6.6 g/dL (ref 6.5–8.1)

## 2024-09-24 LAB — CBC WITH DIFFERENTIAL (CANCER CENTER ONLY)
Abs Immature Granulocytes: 0.04 K/uL (ref 0.00–0.07)
Basophils Absolute: 0.1 K/uL (ref 0.0–0.1)
Basophils Relative: 1 %
Eosinophils Absolute: 0.2 K/uL (ref 0.0–0.5)
Eosinophils Relative: 2 %
HCT: 41.3 % (ref 36.0–46.0)
Hemoglobin: 14.3 g/dL (ref 12.0–15.0)
Immature Granulocytes: 0 %
Lymphocytes Relative: 8 %
Lymphs Abs: 0.8 K/uL (ref 0.7–4.0)
MCH: 34.2 pg — ABNORMAL HIGH (ref 26.0–34.0)
MCHC: 34.6 g/dL (ref 30.0–36.0)
MCV: 98.8 fL (ref 80.0–100.0)
Monocytes Absolute: 0.7 K/uL (ref 0.1–1.0)
Monocytes Relative: 8 %
Neutro Abs: 7.8 K/uL — ABNORMAL HIGH (ref 1.7–7.7)
Neutrophils Relative %: 81 %
Platelet Count: 115 K/uL — ABNORMAL LOW (ref 150–400)
RBC: 4.18 MIL/uL (ref 3.87–5.11)
RDW: 14.9 % (ref 11.5–15.5)
WBC Count: 9.7 K/uL (ref 4.0–10.5)
nRBC: 0 % (ref 0.0–0.2)

## 2024-09-24 LAB — TOTAL PROTEIN, URINE DIPSTICK: Protein, ur: 30 mg/dL — AB

## 2024-09-24 MED ORDER — SODIUM CHLORIDE 0.9 % IV SOLN
2400.0000 mg/m2 | INTRAVENOUS | Status: DC
  Administered 2024-09-24: 3500 mg via INTRAVENOUS
  Filled 2024-09-24: qty 70

## 2024-09-24 MED ORDER — SODIUM CHLORIDE 0.9 % IV SOLN
Freq: Once | INTRAVENOUS | Status: AC
  Filled 2024-09-24: qty 250

## 2024-09-24 MED ORDER — ACETAMINOPHEN 325 MG PO TABS
650.0000 mg | ORAL_TABLET | ORAL | Status: DC | PRN
  Administered 2024-09-24: 650 mg via ORAL
  Filled 2024-09-24: qty 2

## 2024-09-24 MED ORDER — DEXAMETHASONE SODIUM PHOSPHATE 10 MG/ML IJ SOLN
10.0000 mg | Freq: Once | INTRAMUSCULAR | Status: AC
  Administered 2024-09-24: 10 mg via INTRAVENOUS
  Filled 2024-09-24: qty 1

## 2024-09-24 MED ORDER — SODIUM CHLORIDE 0.9 % IV SOLN
5.0000 mg/kg | Freq: Once | INTRAVENOUS | Status: AC
  Administered 2024-09-24: 275 mg via INTRAVENOUS
  Filled 2024-09-24: qty 11

## 2024-09-24 MED ORDER — FLUOROURACIL CHEMO INJECTION 2.5 GM/50ML
400.0000 mg/m2 | Freq: Once | INTRAVENOUS | Status: AC
  Administered 2024-09-24: 600 mg via INTRAVENOUS
  Filled 2024-09-24: qty 12

## 2024-09-24 MED ORDER — ONDANSETRON HCL 4 MG/2ML IJ SOLN
8.0000 mg | INTRAMUSCULAR | Status: DC | PRN
  Administered 2024-09-24: 8 mg via INTRAVENOUS
  Filled 2024-09-24: qty 4

## 2024-09-24 MED ORDER — SODIUM CHLORIDE 0.9 % IV SOLN
400.0000 mg/m2 | Freq: Once | INTRAVENOUS | Status: AC
  Administered 2024-09-24: 596 mg via INTRAVENOUS
  Filled 2024-09-24: qty 29.8

## 2024-09-24 NOTE — Assessment & Plan Note (Signed)
 Chemotherapy plan as listed above

## 2024-09-24 NOTE — Patient Instructions (Signed)
 [facility] - A DEPT OF [facility]  Discharge Instructions: Thank you for choosing [facility] to provide your oncology and hematology care.  If you have a lab appointment with the Cancer Center, please go directly to the Cancer Center and check in at the registration area.  Wear comfortable clothing and clothing appropriate for easy access to any Portacath or PICC line.   We strive to give you quality time with your provider. You may need to reschedule your appointment if you arrive late (15 or more minutes).  Arriving late affects you and other patients whose appointments are after yours.  Also, if you miss three or more appointments without notifying the office, you may be dismissed from the clinic at the provider's discretion.      For prescription refill requests, have your pharmacy contact our office and allow 72 hours for refills to be completed.    Today you received the following chemotherapy and/or immunotherapy agents MVASI , Leucovorin , Adrucil       To help prevent nausea and vomiting after your treatment, we encourage you to take your nausea medication as directed.  BELOW ARE SYMPTOMS THAT SHOULD BE REPORTED IMMEDIATELY: *FEVER GREATER THAN 100.4 F (38 C) OR HIGHER *CHILLS OR SWEATING *NAUSEA AND VOMITING THAT IS NOT CONTROLLED WITH YOUR NAUSEA MEDICATION *UNUSUAL SHORTNESS OF BREATH *UNUSUAL BRUISING OR BLEEDING *URINARY PROBLEMS (pain or burning when urinating, or frequent urination) *BOWEL PROBLEMS (unusual diarrhea, constipation, pain near the anus) TENDERNESS IN MOUTH AND THROAT WITH OR WITHOUT PRESENCE OF ULCERS (sore throat, sores in mouth, or a toothache) UNUSUAL RASH, SWELLING OR PAIN  UNUSUAL VAGINAL DISCHARGE OR ITCHING   Items with * indicate a potential emergency and should be followed up as soon as possible or go to the Emergency Department if any problems should occur.  Please show the CHEMOTHERAPY ALERT CARD or  IMMUNOTHERAPY ALERT CARD at check-in to the Emergency Department and triage nurse.  Should you have questions after your visit or need to cancel or reschedule your appointment, please contact [facility] - A DEPT OF [facility]  [phone] and follow the prompts.  Office hours are 8:00 a.m. to 4:30 p.m. Monday - Friday. Please note that voicemails left after 4:00 p.m. may not be returned until the following business day.  We are closed weekends and major holidays. You have access to a nurse at all times for urgent questions. Please call the main number to the clinic [phone] and follow the prompts.  For any non-urgent questions, you may also contact your provider using MyChart. We now offer e-Visits for anyone 66 and older to request care online for non-urgent symptoms. For details visit [email or web address].   Also download the MyChart app! Go to the app store, search MyChart, open the app, select [facility], and log in with your MyChart username and password.

## 2024-09-24 NOTE — Assessment & Plan Note (Signed)
 Acute on chronic.  Associated sciatic pain.  I have referred to orthopedics in June and she has not established care yet

## 2024-09-24 NOTE — Progress Notes (Signed)
 Hematology/Oncology Progress note [phone] [phone]      CHIEF COMPLAINTS/REASON FOR VISIT:  Follow-up for Stage IV  rectal cancer treatments.  ASSESSMENT & PLAN:   Cancer Staging  Rectal cancer [facility]) Staging form: Colon and Rectum, AJCC 8th Edition - Pathologic stage from 05/03/2022: Stage IIIB (pT3, pN1b, cM0) - Signed by Babara Call, MD on 05/03/2022 - Pathologic stage from 06/18/2023: Stage IVA (rpTX, rpN0, rpM1a) - Signed by Babara Call, MD on 06/18/2023   Hip pain Acute on chronic.  Associated sciatic pain.  I have referred to orthopedics in June and she has not established care yet  Rectal cancer [facility]) History of Stage III. pT3 pN1b cM0, s/p APR. 05/2023 Stage IV current rectal adenocarcinoma Currently on adjuvant chemotherapy. S/p FOLFOX x 4 cycles S/p concurrent chemotherapy [Xeloda  1300 mg BID] with RT, and then another 4 cycles of adjuvant FOLFOX [dose reduced oxaliplatin  and omit 5-FU bolus 05/2023 CT imaging indicates disease progression. liver biopsy pathology showed metastatic carcinoma--> 06/2023 FOLFIRI + Bevacizumab --> 10/2023 and 02/2024 CT partial response --> 05/2024 CT stable - Shared decision was made to hold off irinotecan  given stable disease--> 08/2024 CT stable.  NGS showed TP53 missense variant, APC frameshift, TCF7L2 Frameshift, FLT3 copy number gain., TMB 4.7, pMMR Wildtype KRAS/NRAS  Labs are reviewed and discussed with patient. Recent Signatera circulating tumor DNA is positive.  CT showed stable disease.  Proceed with 5-Fu + Bevacizumab      Chemotherapy-induced neuropathy Grade 2  Continue gabapentin  300mg  BID.   Encounter for antineoplastic chemotherapy Chemotherapy plan as listed above  Hypokalemia Stable K level. Recommend patient to stop KCL 20meq daily.   No orders of the defined types were placed in this encounter.   Follow-up 2 week(s)  All questions were answered. The patient knows to call the clinic with any  problems, questions or concerns.  Call Babara, MD, PhD [facility] Hematology Oncology 09/24/2024     HISTORY OF PRESENTING ILLNESS:   April Luna is a  53 y.o.  female presents for treatment of rectal cancer Oncology History  Rectal cancer (HCC)  02/26/2022 Imaging   MRI PELVIS WITHOUT CONTRAST- By imaging, rectal cancer stage:  T1/T2, N0, Mx    03/02/2022 Imaging   CT CHEST AND ABDOMEN WITH CONTRAST 1. No convincing evidence of metastatic disease within the chest or abdomen. 2. Atrophic left kidney with multifocal renal scarring and cortical calcifications as well as nonobstructive renal stones measuring up to 5 mm. 3. Prominent left-sided predominant retroperitoneal lymph nodes measuring up to 8 mm near the level of the renal hilum, overall decreased in size dating back to CT September 19, 2018 and favored reactive related to left renal inflammation. 4.  Aortic Atherosclerosis (ICD10-I70.0).   03/27/2022 Genetic Testing    [facility] CustomNext+RNA cancer panel found no pathogenic mutations.    04/21/2022 Initial Diagnosis   Rectal cancer - baseline CEA 3.6 -02/09/2022, patient had colonoscopy which showed renal mass 10 cm from anal verge.  5 mm polyp in ascending colon.  Removed and retrieved. Pathology showed rectal adenocarcinoma.  The polyp in the ascending colon is a tubular adenoma.  MRI showed cT1/T2N0 disease  -04/21/2022, patient underwent robotic assisted ultralow anterior resection. Pathology showed moderately differentiated adenocarcinoma, 4.5 cm in maximal extent, with focal extension through muscularis propria into perirectal soft tissue.  3 lymph nodes positive for metastatic carcinoma.  Negative margin.  pT3 pN1b, MSI stable.   05/03/2022 Cancer Staging   Staging form: Colon and Rectum, AJCC  8th Edition - Pathologic stage from 05/03/2022: Stage IIIB (pT3, pN1b, cM0) - Signed by Babara Call, MD on 05/03/2022 Stage prefix: Initial diagnosis   05/11/2022 Miscellaneous   Medi  port placed by Dr.White   05/26/2022 -  Chemotherapy   FOLFOX Q2 weeks x 4   05/26/2022 - 07/09/2022 Chemotherapy   Patient is on Treatment Plan : COLORECTAL FOLFOX q14d x 8 cycles     05/26/2022 - 12/13/2022 Chemotherapy   Patient is on Treatment Plan : COLORECTAL FOLFOX q14d x 4 months     07/27/2022 -  Chemotherapy   Concurrent chemotherapy- Xeloda   1300mg  BID and radiation.    07/27/2022 - 09/12/2022 Radiation Therapy    concurrent chemotherapy [Xeloda  1300 mg BID] with RT    09/20/2022 Imaging   CT Angiogram chest PET protocol There is no evidence of pulmonary artery embolism. There is no evidence of thoracic aortic dissection.   Small linear patchy alveolar infiltrate is seen in medial segment of right middle lobe suggesting atelectasis/pneumonia.     09/28/2022 - 09/30/2022 Hospital Admission   Admission due to acute respiratory failure with hypoxia, COPD exacerbation   09/28/2022 Imaging   CT angio chest PE protocol 1. Negative for acute PE or thoracic aortic dissection. 2. New ground-glass infiltrates anteriorly in bilateral upper lobes,may represent atypical edema, infectious or inflammatory process. 3.  Aortic Atherosclerosis     12/22/2022 Imaging   CT chest abdomen pelvis w contrast  Stable exam. No evidence of recurrent or metastatic carcinoma within the chest, abdomen, or pelvis.   Left nephrolithiasis and renal parenchymal atrophy. No evidence of ureteral calculi or hydronephrosis.   Aortic Atherosclerosis   05/31/2023 Imaging   CT chest abdomen pelvis with contrast showed 1. New hypodense 16 mm lesion in the right lobe of the liver with very subtle adjacent 5 mm lesion, suspicious for metastatic disease. 2. Small bilateral pulmonary nodules, some of which were subtly evident on prior examination only in retrospect, some of which demonstrate degree of cavitation, suspicious for metastatic disease. 3. Prominent retroperitoneal lymph nodes are similar prior. 4. Prior low  anterior resection with Hartmann's pouch formation,similar small amount of perirectal/presacral soft tissue and fluid,favored postsurgical/posttreatment change. Suggest continued attention on follow-up imaging. 5. Large volume of formed stool in the colon. 6. Nonobstructive left renal calculi measure under 1 cm.     06/08/2023 Relapse/Recurrence   Ultrasound-guided liver biopsy showed Pathology showed metastatic carcinoma, morphology consistent with patient's clinical history of rectal adenocarcinoma.  [employer] NGS 648 gene panel showed TP53 missense variant, APC frameshift, TCF7L2 frameshift, FLT3 copy number gain, TMB 4.13m/MB MSI stable.  Wildtype KRAS/NRAS  [facility] RNA Seq - ERBB3 overexpressed, VEGFA overexpressed, NRAS overexpressed.     06/18/2023 Cancer Staging   Staging form: Colon and Rectum, AJCC 8th Edition - Pathologic stage from 06/18/2023: Stage IVA (rpTX, pN0, pM1a) - Signed by Babara Call, MD on 06/18/2023 Stage prefix: Recurrence Total positive nodes: 0   07/02/2023 - 06/04/2024 Chemotherapy   COLORECTAL FOLFIRI + Bevacizumab  q14d      11/13/2023 Imaging   CT chest abdomen pelvis w contrast showed  1. Increase in cavitation with decreased soft tissue involving the scattered bilateral pulmonary nodules, consistent with treatment response. No new suspicious pulmonary nodules or masses. 2. Decrease in size and conspicuity of the hypodense lesions in the right lobe of the liver, consistent with treatment response. No new suspicious hepatic lesion identified. 3. Stable prominent nonspecific retroperitoneal lymph nodes. Suggest continued attention on follow-up imaging. 4.  Similar surgical changes of prior low anterior resection with left anterior abdominal wall colostomy. Similar mesorectal/presacral soft tissue, no new suspicious enhancing nodularity. 5. Similar soft tissue nodularity in the bilateral posterior gluteal subcutaneous soft tissues. 6. Moderate volume of formed  stool in the colon. Correlate for constipation.   03/03/2024 Imaging   CT chest abdomen pelvis w contrast showed 1. 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 2. No new or progressive disease. 3. Similar prominent but not pathologically sized abdominal retroperitoneal nodes, nonspecific. 4. Status post low anterior resection with descending colostomy. Possible constipation. 5. Age advanced coronary artery atherosclerosis. Recommend assessment of coronary risk factors. 6.  Aortic Atherosclerosis (ICD10-I70.0). 7. Left nephrolithiasis and renal scarring.    05/27/2024 Imaging   CT chest abdomen pelvis w contrast showed 1. Status post abdominoperineal resection with left lower quadrant end colostomy. 2. Unchanged enlarged retroperitoneal lymph nodes. 3. Unchanged small hypodense lesion of the posterior right lobe of the liver. 4. Very tiny, irregular cavitary nodules are unchanged. 5. New areas of irregular atelectasis or consolidation of the peripheral right lower lobe, nonspecific and most likely infectious or inflammatory. Metastatic disease distinctly not favored. Attention on follow-up. 6. Atrophic, scarred left kidney with nonobstructive calculi of the inferior pole. No hydronephrosis.   Aortic Atherosclerosis (ICD10-I70.0).     06/18/2024 -  Chemotherapy   5-FU and Bevacizumab  maintenance.    09/01/2024 Imaging   CT chest abdomen pelvis w contrast showed 1. Status post abdominoperineal resection with left lower quadrant end colostomy. No evidence of local recurrence. 2. Stable mildly enlarged/prominent retroperitoneal lymph nodes. 3. Stable tiny irregular cavitary pulmonary nodules. 4. Unchanged small hypodense lesion in the posterior right lobe of the liver. 5. No evidence of new or progressive disease in the chest, abdomen or pelvis. 6. Aortic atherosclerosis.   Aortic Atherosclerosis (ICD10-I70.0).     Patient has bipolar and schizophrenia..   Patient is married  She has housing issue due to previous history of eviction.  she does not live with her husband currently. She lives with some family members.   + chronic dysuria and urgency she was seen by Urology and was started on Gemteza.  Symptoms are improved.    INTERVAL HISTORY April Luna is a 53 y.o. female who has above history reviewed by me today presents for follow up visit for rectal cancer.   Denies any melena or blood in the stool. Stable neuropathy symptoms. + loose BM, stable baseline.  + Muscle pain,  Appetite is fair.  She has bilateral acute on chronic hip pain. She cancelled her orthopedic appointment. She plans to re schedule.   Review of Systems  Constitutional:  Positive for fatigue. Negative for appetite change, chills and fever.  HENT:   Negative for hearing loss, mouth sores and voice change.   Eyes:  Negative for eye problems.  Respiratory:  Negative for chest tightness, cough and shortness of breath.   Cardiovascular:  Negative for chest pain.  Gastrointestinal:  Positive for diarrhea. Negative for abdominal distention, abdominal pain, blood in stool and nausea.       + colostomy   Endocrine: Negative for hot flashes.  Genitourinary:  Negative for difficulty urinating, dysuria and frequency.   Musculoskeletal:  Positive for arthralgias.  Skin:  Negative for itching and rash.  Neurological:  Positive for numbness. Negative for extremity weakness.  Hematological:  Negative for adenopathy.  Psychiatric/Behavioral:  Negative for confusion.     MEDICAL HISTORY:  Past Medical History:  Diagnosis Date   Allergy    pollen   Anxiety    Arthritis    right hip   Bipolar 1 disorder (HCC)    Cancer (HCC)    rectal   Chemotherapy-induced neuropathy 10/24/2022   Chronic kidney disease    COPD (chronic obstructive pulmonary disease) (HCC)    Depression    Family history of adverse reaction to anesthesia    grand father had a stroke during  anesthesia   Family history of breast cancer    Family history of colon cancer    Family history of uterine cancer    GERD (gastroesophageal reflux disease)    History of kidney stones    Hyperlipidemia    Hypertension    Hypothyroidism    Panic attack    Pneumonia    Psoriasis    Sleep apnea 08/11/2021   No CPAP   Type 2 diabetes mellitus with microalbuminuria, without long-term current use of insulin  (HCC) 06/24/2019    SURGICAL HISTORY: Past Surgical History:  Procedure Laterality Date   BREAST BIOPSY Left 12/14/2021   us  bx, venus marker, path pending   CESAREAN SECTION     COLONOSCOPY WITH PROPOFOL  N/A 02/09/2022   Procedure: COLONOSCOPY WITH PROPOFOL ;  Surgeon: Unk Corinn Skiff, MD;  Location: [facility];  Service: Endoscopy;  Laterality: N/A;  sleep apnea   CYSTOSCOPY W/ RETROGRADES Left 11/08/2018   Procedure: CYSTOSCOPY WITH RETROGRADE PYELOGRAM;  Surgeon: Francisca Redell BROCKS, MD;  Location: [facility];  Service: Urology;  Laterality: Left;   CYSTOSCOPY/URETEROSCOPY/HOLMIUM LASER/STENT PLACEMENT Left 11/08/2018   Procedure: CYSTOSCOPY/URETEROSCOPY/HOLMIUM LASER/STENT PLACEMENT;  Surgeon: Francisca Redell BROCKS, MD;  Location: [facility];  Service: Urology;  Laterality: Left;   IR CV LINE INJECTION  06/25/2023   IR IMAGING GUIDED PORT INSERTION  05/11/2022   IR REMOVE CV FIBRIN SHEATH  06/27/2023   MOUTH SURGERY     wisdom teeth extraction   MOUTH SURGERY     teeth removal   POLYPECTOMY N/A 02/09/2022   Procedure: POLYPECTOMY;  Surgeon: Unk Corinn Skiff, MD;  Location: [facility];  Service: Endoscopy;  Laterality: N/A;   XI ROBOTIC ASSISTED LOWER ANTERIOR RESECTION N/A 04/21/2022   Procedure: XI ROBOTIC ASSISTED LOWER ANTERIOR RESECTION WITH COLOSTOMY, BILATERAL TAP BLOCK, ASSESSMENT OF TISSUE PERFUSSION WITH FIREFLY INJECTION;  Surgeon: Teresa Lonni HERO, MD;  Location: [facility];  Service: General;  Laterality: N/A;    SOCIAL HISTORY: Social History    Socioeconomic History   Marital status: Married    Spouse name: Morene Jama Pereyra   Number of children: 1   Years of education: 12   Highest education level: High school graduate  Occupational History   Occupation: unemployed    Comment: disabled  Tobacco Use   Smoking status: Some Days    Current packs/day: 0.25    Average packs/day: 0.3 packs/day for 39.4 years (9.8 ttl pk-yrs)    Types: Cigarettes    Start date: 05/13/1985    Passive exposure: Current   Smokeless tobacco: Never   Tobacco comments:    5-6 cigarettes weekly- 11/07/2022  Vaping Use   Vaping status: Former   Start date: 05/25/2018   Quit date: 09/24/2018  Substance and Sexual Activity   Alcohol use: Not Currently    Alcohol/week: 4.0 standard drinks of alcohol    Types: 4 Glasses of wine per week    Comment: quit ETOH in Feb. 2023   Drug use: Yes    Types: Marijuana  Comment: 2 days ago   Sexual activity: Not Currently    Birth control/protection: Post-menopausal  Other Topics Concern   Not on file  Social History Narrative   Lives with husband and son    Social Drivers of Health   Financial Resource Strain: Medium Risk (09/04/2024)   Overall Financial Resource Strain (CARDIA)    Difficulty of Paying Living Expenses: Somewhat hard  Food Insecurity: Food Insecurity Present (09/04/2024)   Hunger Vital Sign    Worried About Running Out of Food in the Last Year: Sometimes true    Ran Out of Food in the Last Year: Sometimes true  Transportation Needs: No Transportation Needs (09/04/2024)   PRAPARE - Administrator, Civil Service (Medical): No    Lack of Transportation (Non-Medical): No  Physical Activity: Inactive (09/04/2024)   Exercise Vital Sign    Days of Exercise per Week: 0 days    Minutes of Exercise per Session: 0 min  Stress: Stress Concern Present (09/04/2024)   [employer] of Occupational Health - Occupational Stress Questionnaire    Feeling of Stress: To some extent   Social Connections: Moderately Integrated (09/04/2024)   Social Connection and Isolation Panel    Frequency of Communication with Friends and Family: More than three times a week    Frequency of Social Gatherings with Friends and Family: More than three times a week    Attends Religious Services: More than 4 times per year    Active Member of [employer] or Organizations: No    Attends Banker Meetings: Never    Marital Status: Married  Catering manager Violence: Not At Risk (09/04/2024)   Humiliation, Afraid, Rape, and Kick questionnaire    Fear of Current or Ex-Partner: No    Emotionally Abused: No    Physically Abused: No    Sexually Abused: No    FAMILY HISTORY: Family History  Problem Relation Age of Onset   Depression Mother    Anxiety disorder Mother    Diabetes Mother    Hypertension Mother    Hyperlipidemia Mother    Cancer Mother    Uterine cancer Mother 8   Cervical cancer Mother 49   Colon cancer Father    Depression Brother    Anxiety disorder Brother    Cancer Maternal Aunt        unk types   Diabetes Mellitus II Maternal Grandmother    Hypercholesterolemia Maternal Grandmother    Breast cancer Maternal Grandmother    Cancer Paternal Grandmother    Diabetes Paternal Grandmother    Melanoma Paternal Grandmother    Stomach cancer Paternal Grandmother     ALLERGIES:  is allergic to metformin and related, nsaids, perphenazine, sulfa antibiotics, abilify [aripiprazole], and penicillins.  MEDICATIONS:  Current Outpatient Medications  Medication Sig Dispense Refill   clonazePAM (KLONOPIN) 0.5 MG tablet Take 1 mg by mouth 2 (two) times daily as needed.     acetaminophen  (TYLENOL ) 325 MG tablet Take 650 mg by mouth every 6 (six) hours as needed for headache (pain). (Patient not taking: Reported on 09/24/2024)     albuterol  (VENTOLIN  HFA) 108 (90 Base) MCG/ACT inhaler Inhale 2 puffs into the lungs every 6 (six) hours as needed for wheezing or shortness of  breath. (Patient not taking: Reported on 09/24/2024) 1 each 5   amantadine  (SYMMETREL ) 100 MG capsule Take 100 mg by mouth 2 (two) times daily. (Patient not taking: Reported on 09/24/2024)     blood glucose meter kit and  supplies Dispense based on patient and insurance preference. Use up to four times daily as directed. (FOR ICD-10 E10.9, E11.9). 1 each 0   Budeson-Glycopyrrol-Formoterol  (BREZTRI  AEROSPHERE) 160-9-4.8 MCG/ACT AERO Inhale 2 puffs into the lungs in the morning and at bedtime. (Patient not taking: Reported on 09/24/2024) 10.7 g 2   buPROPion  (WELLBUTRIN  XL) 300 MG 24 hr tablet Take 300 mg by mouth daily. (Patient not taking: Reported on 09/24/2024)     Cholecalciferol  (VITAMIN D -3) 125 MCG (5000 UT) TABS Take 5,000 Units by mouth daily. (Patient not taking: Reported on 09/24/2024) 30 tablet 1   dexamethasone  (DECADRON ) 4 MG tablet Take 2 tablets (8 mg total) by mouth See admin instructions. Take 8mg  daily for 2 days after each chemotherapy. (Patient not taking: Reported on 09/24/2024) 30 tablet 1   diltiazem  (CARDIZEM  CD) 120 MG 24 hr capsule TAKE 1 CAPSULE BY MOUTH DAILY (Patient not taking: Reported on 09/24/2024) 90 capsule 1   docusate sodium  (COLACE) 100 MG capsule TAKE 1 CAPSULE BY MOUTH TWICE DAILY (Patient not taking: Reported on 09/10/2024) 60 capsule 2   FARXIGA  10 MG TABS tablet TAKE 1 TABLET BY MOUTH DAILY BEFORE BREAKFAST (Patient not taking: Reported on 09/24/2024) 90 tablet 0   gabapentin  (NEURONTIN ) 300 MG capsule Take 300 mg by mouth 2 (two) times daily. (Patient not taking: Reported on 09/24/2024)     icosapent  Ethyl (VASCEPA ) 1 g capsule Take 2 capsules (2 g total) by mouth in the morning and at bedtime. (Patient not taking: Reported on 09/24/2024) 120 capsule 2   levothyroxine  (SYNTHROID ) 75 MCG tablet TAKE 1 TABLET BY MOUTH DAILY BEFORE BREAKFAST (Patient not taking: Reported on 09/24/2024) 90 tablet 2   lidocaine -prilocaine  (EMLA ) cream Apply to affected area once (Patient not  taking: Reported on 09/24/2024) 30 g 3   loperamide  (IMODIUM ) 2 MG capsule Take 1 capsule (2 mg total) by mouth See admin instructions. Initial: 4 mg,the 2 mg every 2 hours (4 mg every 4 hours at night)  maximum: 16 mg/day (Patient not taking: Reported on 09/24/2024) 120 capsule 2   loratadine  (CLARITIN ) 10 MG tablet Take 10 mg by mouth every morning. (Patient not taking: Reported on 09/24/2024)     magic mouthwash w/lidocaine  SOLN Take 5 mLs by mouth 4 (four) times daily as needed for mouth pain. Sig: Swish/Swallow 5-10 ml four times a day as needed. Dispense 480 ml. 1RF (Patient not taking: Reported on 09/24/2024) 480 mL 1   ondansetron  (ZOFRAN ) 8 MG tablet Take 1 tablet (8 mg total) by mouth every 8 (eight) hours as needed for nausea, vomiting or refractory nausea / vomiting. Start on the third day after chemotherapy. (Patient not taking: Reported on 09/24/2024) 30 tablet 1   paliperidone  (INVEGA  SUSTENNA) 234 MG/1.5ML SUSY injection Inject 234 mg into the muscle every 30 (thirty) days. On or about the 14th of each month     pantoprazole  (PROTONIX ) 40 MG tablet TAKE 1 TABLET BY MOUTH DAILY (Patient not taking: Reported on 09/24/2024) 90 tablet 2   pioglitazone  (ACTOS ) 15 MG tablet Take 1 tablet (15 mg total) by mouth daily. (Patient not taking: Reported on 09/24/2024) 90 tablet 1   potassium chloride  SA (KLOR-CON  M) 20 MEQ tablet Take 1 tablet (20 mEq total) by mouth daily. (Patient not taking: Reported on 09/24/2024) 30 tablet 0   promethazine  (PHENERGAN ) 12.5 MG tablet Take 1 tablet (12.5 mg total) by mouth every 6 (six) hours as needed for nausea or vomiting. (Patient not taking: Reported on 09/24/2024)  30 tablet 0   rosuvastatin  (CRESTOR ) 40 MG tablet TAKE 1 TABLET BY MOUTH EVERY MORNING (Patient not taking: Reported on 09/24/2024) 90 tablet 0   sertraline  (ZOLOFT ) 100 MG tablet Take 200 mg by mouth every morning. (Patient not taking: Reported on 09/24/2024)     Spacer/Aero-Holding Chambers (AEROCHAMBER MV)  inhaler Use as instructed (Patient not taking: Reported on 09/24/2024) 1 each 0   Vibegron  (GEMTESA ) 75 MG TABS Take 1 tablet (75 mg total) by mouth daily. (Patient not taking: Reported on 09/24/2024)     No current facility-administered medications for this visit.   Facility-Administered Medications Ordered in Other Visits  Medication Dose Route Frequency Provider Last Rate Last Admin   acetaminophen  (TYLENOL ) tablet 650 mg  650 mg Oral PRN Shakeitha Umbaugh, MD   650 mg at 09/24/24 0916   albuterol  (PROVENTIL ) (2.5 MG/3ML) 0.083% nebulizer solution 2.5 mg  2.5 mg Nebulization Once Dgayli, Khabib, MD       fluorouracil  (ADRUCIL ) 3,500 mg in sodium chloride  0.9 % 80 mL chemo infusion  2,400 mg/m2 (Order-Specific) Intravenous 1 day or 1 dose Babara Call, MD   Infusion Verify at 09/24/24 1059   ondansetron  (ZOFRAN ) injection 8 mg  8 mg Intravenous PRN Bexley Mclester, MD   8 mg at 09/24/24 0915   sodium chloride  flush (NS) 0.9 % injection 10 mL  10 mL Intracatheter PRN Babara Call, MD   10 mL at 08/15/23 1327   sodium chloride  flush (NS) 0.9 % injection 10 mL  10 mL Intracatheter PRN Babara Call, MD   10 mL at 03/26/24 1255     PHYSICAL EXAMINATION: ECOG PERFORMANCE STATUS: 0 - Asymptomatic Vitals:   09/24/24 0841  BP: (!) 122/90  Pulse: 99  Resp: 16  Temp: (!) 96 F (35.6 C)  SpO2: 96%     Filed Weights   09/24/24 0841  Weight: 117 lb (53.1 kg)      Physical Exam Constitutional:      General: She is not in acute distress. HENT:     Head: Normocephalic and atraumatic.  Eyes:     General: No scleral icterus. Cardiovascular:     Rate and Rhythm: Normal rate and regular rhythm.  Pulmonary:     Effort: Pulmonary effort is normal. No respiratory distress.     Breath sounds: No wheezing.     Comments: Decreased breath sound bilaterally Abdominal:     General: Bowel sounds are normal. There is no distension.     Palpations: Abdomen is soft.     Comments: +colostomy   Musculoskeletal:         General: No deformity. Normal range of motion.     Cervical back: Normal range of motion and neck supple.  Skin:    General: Skin is warm and dry.     Findings: No erythema or rash.  Neurological:     Mental Status: She is alert and oriented to person, place, and time. Mental status is at baseline.     Cranial Nerves: No cranial nerve deficit.     Coordination: Coordination normal.  Psychiatric:        Mood and Affect: Mood normal.     LABORATORY DATA:  I have reviewed the data as listed    Latest Ref Rng & Units 09/24/2024    8:27 AM 09/10/2024    8:39 AM 08/27/2024   11:11 AM  CBC  WBC 4.0 - 10.5 K/uL 9.7  6.0  6.3   Hemoglobin 12.0 - 15.0  g/dL 85.6  85.0  85.1   Hematocrit 36.0 - 46.0 % 41.3  42.9  44.0   Platelets 150 - 400 K/uL 115  142  116       Latest Ref Rng & Units 09/24/2024    8:27 AM 09/10/2024    8:39 AM 08/27/2024   11:11 AM  CMP  Glucose 70 - 99 mg/dL 839  876  839   BUN 6 - 20 mg/dL 10  15  14    Creatinine 0.44 - 1.00 mg/dL 8.81  8.90  8.93   Sodium 135 - 145 mmol/L 135  137  135   Potassium 3.5 - 5.1 mmol/L 3.7  4.2  4.1   Chloride 98 - 111 mmol/L 105  102  99   CO2 22 - 32 mmol/L 22  24  25    Calcium  8.9 - 10.3 mg/dL 8.8  9.1  9.2   Total Protein 6.5 - 8.1 g/dL 6.6  6.9  6.5   Total Bilirubin 0.0 - 1.2 mg/dL 0.7  0.5  0.6   Alkaline Phos 38 - 126 U/L 79  80  80   AST 15 - 41 U/L 27  21  24    ALT 0 - 44 U/L 12  10  12          RADIOGRAPHIC STUDIES: I have personally reviewed the radiological images as listed and agreed with the findings in the report. CT CHEST ABDOMEN PELVIS W CONTRAST Result Date: 09/01/2024 CLINICAL DATA:  Rectal cancer, follow-up.  * Tracking Code: BO * EXAM: CT CHEST, ABDOMEN, AND PELVIS WITH CONTRAST TECHNIQUE: Multidetector CT imaging of the chest, abdomen and pelvis was performed following the standard protocol during bolus administration of intravenous contrast. RADIATION DOSE REDUCTION: This exam was performed according to the  departmental dose-optimization program which includes automated exposure control, adjustment of the mA and/or kV according to patient size and/or use of iterative reconstruction technique. CONTRAST:  OMNIPAQUE  IOHEXOL  300 MG/ML  SOLN COMPARISON:  Multiple priors including CT May 27, 2024 . FINDINGS: CT CHEST FINDINGS Cardiovascular: Right chest Port-A-Cath with tip near the superior cavoatrial junction. Aortic atherosclerosis. Normal caliber thoracic aorta. Normal size heart. No significant pericardial effusion/thickening. Mediastinum/Nodes: No pathologically enlarged mediastinal, hilar or axillary lymph nodes. The esophagus is grossly unremarkable. No suspicious thyroid  nodule. Lungs/Pleura: Stable tiny irregular cavitary nodules. For reference: -medial posterior left lower lobe nodule measures 5 mm on image 83/4 No new suspicious pulmonary nodules or masses. Scattered atelectasis/scarring. Musculoskeletal: No aggressive lytic or blastic lesion of bone. CT ABDOMEN PELVIS FINDINGS Hepatobiliary: Unchanged small hypodense lesion in the posterior right lobe of the liver hepatic segment VII measuring 8 mm on image 49/2. Gallbladder is unremarkable. No biliary ductal dilation. Pancreas: No pancreatic ductal dilation or evidence of acute inflammation. Spleen: No splenomegaly. Adrenals/Urinary Tract: Bilateral adrenal glands appear normal. Right renal cyst. Too small to accurately characterize bilateral renal lesions. No hydronephrosis. Left cortical renal atrophy with multifocal scarring and nonobstructive renal stones. Stomach/Bowel: Stomach is unremarkable for degree of distension. No pathologic dilation of small or large bowel. No evidence of acute bowel inflammation. Status post abdominoperineal resection with left lower quadrant end colostomy. There is similar soft tissue stranding/fluid in the low pelvis and presacral regions. No new suspicious nodularity. Vascular/Lymphatic: Aortic atherosclerosis. Normal  caliber abdominal aorta. Smooth IVC contours. Mildly enlarged/prominent retroperitoneal lymph nodes are stable from prior. Previously indexed left periaortic lymph node measures 1 cm in short axis on image 64/2, unchanged. Reproductive: Uterus and  bilateral adnexa are unremarkable. Other: Sequela of subcutaneous injections in the bilateral buttocks. Left lower quadrant end colostomy. No significant abdominopelvic free fluid. Musculoskeletal: No aggressive lytic or blastic lesion of bone. Probable postradiation change in the sacrum. IMPRESSION: 1. Status post abdominoperineal resection with left lower quadrant end colostomy. No evidence of local recurrence. 2. Stable mildly enlarged/prominent retroperitoneal lymph nodes. 3. Stable tiny irregular cavitary pulmonary nodules. 4. Unchanged small hypodense lesion in the posterior right lobe of the liver. 5. No evidence of new or progressive disease in the chest, abdomen or pelvis. 6. Aortic atherosclerosis. Aortic Atherosclerosis (ICD10-I70.0). Electronically Signed   By: Reyes Holder M.D.   On: 09/01/2024 13:57

## 2024-09-24 NOTE — Assessment & Plan Note (Signed)
 Stable K level. Recommend patient to stop KCL 20meq daily.

## 2024-09-24 NOTE — Assessment & Plan Note (Signed)
 Grade 2  Continue gabapentin 300mg  BID.

## 2024-09-24 NOTE — Assessment & Plan Note (Signed)
 History of Stage III. pT3 pN1b cM0, s/p APR. 05/2023 Stage IV current rectal adenocarcinoma Currently on adjuvant chemotherapy. S/p FOLFOX x 4 cycles S/p concurrent chemotherapy [Xeloda  1300 mg BID] with RT, and then another 4 cycles of adjuvant FOLFOX [dose reduced oxaliplatin  and omit 5-FU bolus 05/2023 CT imaging indicates disease progression. liver biopsy pathology showed metastatic carcinoma--> 06/2023 FOLFIRI + Bevacizumab --> 10/2023 and 02/2024 CT partial response --> 05/2024 CT stable - Shared decision was made to hold off irinotecan  given stable disease--> 08/2024 CT stable.  NGS showed TP53 missense variant, APC frameshift, TCF7L2 Frameshift, FLT3 copy number gain., TMB 4.7, pMMR Wildtype KRAS/NRAS  Labs are reviewed and discussed with patient. Recent Signatera circulating tumor DNA is positive.  CT showed stable disease.  Proceed with 5-Fu + Bevacizumab 

## 2024-09-25 LAB — CEA: CEA: 3.8 ng/mL (ref 0.0–4.7)

## 2024-09-26 ENCOUNTER — Inpatient Hospital Stay

## 2024-10-08 ENCOUNTER — Inpatient Hospital Stay

## 2024-10-08 ENCOUNTER — Inpatient Hospital Stay: Admitting: Occupational Therapy

## 2024-10-08 ENCOUNTER — Inpatient Hospital Stay: Admitting: Oncology

## 2024-10-08 ENCOUNTER — Encounter: Payer: Self-pay | Admitting: Oncology

## 2024-10-08 VITALS — BP 128/84 | HR 84 | Temp 97.4°F | Resp 16 | Wt 122.0 lb

## 2024-10-08 VITALS — BP 137/89 | HR 72 | Temp 96.1°F | Resp 17

## 2024-10-08 DIAGNOSIS — M25551 Pain in right hip: Secondary | ICD-10-CM

## 2024-10-08 DIAGNOSIS — C20 Malignant neoplasm of rectum: Secondary | ICD-10-CM | POA: Diagnosis not present

## 2024-10-08 DIAGNOSIS — G620 Drug-induced polyneuropathy: Secondary | ICD-10-CM

## 2024-10-08 DIAGNOSIS — T451X5A Adverse effect of antineoplastic and immunosuppressive drugs, initial encounter: Secondary | ICD-10-CM

## 2024-10-08 DIAGNOSIS — Z5111 Encounter for antineoplastic chemotherapy: Secondary | ICD-10-CM

## 2024-10-08 LAB — CMP (CANCER CENTER ONLY)
ALT: 12 U/L (ref 0–44)
AST: 22 U/L (ref 15–41)
Albumin: 3.6 g/dL (ref 3.5–5.0)
Alkaline Phosphatase: 73 U/L (ref 38–126)
Anion gap: 7 (ref 5–15)
BUN: 19 mg/dL (ref 6–20)
CO2: 25 mmol/L (ref 22–32)
Calcium: 9 mg/dL (ref 8.9–10.3)
Chloride: 104 mmol/L (ref 98–111)
Creatinine: 0.98 mg/dL (ref 0.44–1.00)
GFR, Estimated: >60 mL/min (ref >60)
Glucose, Bld: 112 mg/dL — ABNORMAL HIGH (ref 70–99)
Potassium: 4.4 mmol/L (ref 3.5–5.1)
Sodium: 136 mmol/L (ref 135–145)
Total Bilirubin: 0.4 mg/dL (ref 0.0–1.2)
Total Protein: 6.5 g/dL (ref 6.5–8.1)

## 2024-10-08 LAB — CBC WITH DIFFERENTIAL (CANCER CENTER ONLY)
Abs Immature Granulocytes: 0.01 K/uL (ref 0.00–0.07)
Basophils Absolute: 0.1 K/uL (ref 0.0–0.1)
Basophils Relative: 1 %
Eosinophils Absolute: 0.3 K/uL (ref 0.0–0.5)
Eosinophils Relative: 5 %
HCT: 40.4 % (ref 36.0–46.0)
Hemoglobin: 13.6 g/dL (ref 12.0–15.0)
Immature Granulocytes: 0 %
Lymphocytes Relative: 12 %
Lymphs Abs: 0.8 K/uL (ref 0.7–4.0)
MCH: 33.7 pg (ref 26.0–34.0)
MCHC: 33.7 g/dL (ref 30.0–36.0)
MCV: 100.2 fL — ABNORMAL HIGH (ref 80.0–100.0)
Monocytes Absolute: 0.7 K/uL (ref 0.1–1.0)
Monocytes Relative: 11 %
Neutro Abs: 4.4 K/uL (ref 1.7–7.7)
Neutrophils Relative %: 71 %
Platelet Count: 142 K/uL — ABNORMAL LOW (ref 150–400)
RBC: 4.03 MIL/uL (ref 3.87–5.11)
RDW: 15.9 % — ABNORMAL HIGH (ref 11.5–15.5)
WBC Count: 6.2 K/uL (ref 4.0–10.5)
nRBC: 0 % (ref 0.0–0.2)

## 2024-10-08 LAB — TOTAL PROTEIN, URINE DIPSTICK: Protein, ur: NEGATIVE mg/dL

## 2024-10-08 MED ORDER — SODIUM CHLORIDE 0.9 % IV SOLN
5.0000 mg/kg | Freq: Once | INTRAVENOUS | Status: AC
  Administered 2024-10-08: 275 mg via INTRAVENOUS
  Filled 2024-10-08: qty 11

## 2024-10-08 MED ORDER — ONDANSETRON HCL 4 MG/2ML IJ SOLN
8.0000 mg | INTRAMUSCULAR | Status: DC | PRN
  Administered 2024-10-08: 8 mg via INTRAVENOUS
  Filled 2024-10-08: qty 4

## 2024-10-08 MED ORDER — ACETAMINOPHEN 325 MG PO TABS: 650.0000 mg | ORAL_TABLET | ORAL | Status: DC | PRN

## 2024-10-08 MED ORDER — SODIUM CHLORIDE 0.9 % IV SOLN
2400.0000 mg/m2 | INTRAVENOUS | Status: DC
  Administered 2024-10-08: 3500 mg via INTRAVENOUS
  Filled 2024-10-08: qty 70

## 2024-10-08 MED ORDER — SODIUM CHLORIDE 0.9 % IV SOLN
400.0000 mg/m2 | Freq: Once | INTRAVENOUS | Status: AC
  Administered 2024-10-08: 596 mg via INTRAVENOUS
  Filled 2024-10-08: qty 29.8

## 2024-10-08 MED ORDER — SODIUM CHLORIDE 0.9 % IV SOLN
Freq: Once | INTRAVENOUS | Status: AC
  Filled 2024-10-08: qty 250

## 2024-10-08 MED ORDER — FLUOROURACIL CHEMO INJECTION 2.5 GM/50ML
400.0000 mg/m2 | Freq: Once | INTRAVENOUS | Status: AC
  Administered 2024-10-08: 600 mg via INTRAVENOUS
  Filled 2024-10-08: qty 12

## 2024-10-08 MED ORDER — DEXAMETHASONE SOD PHOSPHATE PF 10 MG/ML IJ SOLN
10.0000 mg | Freq: Once | INTRAMUSCULAR | Status: AC
  Administered 2024-10-08: 10 mg via INTRAVENOUS
  Filled 2024-10-08: qty 1

## 2024-10-08 NOTE — Progress Notes (Signed)
 Nutrition Follow-up:   Patient with stage III adenocarcinoma of rectum.  Patient receiving folfiri and bevacizumab .    Met with patient during infusion.  Reports that she is eating better.  Has been eating yogurt and oatmeal for breakfast and snacking more.    Medications: reviewed  Labs: reviewed  Anthropometrics:   Weight 122 lb today 119 lb on 9/19 123 lb on 6/10 126 lb on 5/27 129 lb on 4/28   NUTRITION DIAGNOSIS: Inadequate oral intake improving with weight increasing   INTERVENTION:  Patient denied needing food bag today from food pantry.   Reviewed foods high in protein and calories to consume Continue eating q 2 hours    MONITORING, EVALUATION, GOAL: weight trends, intake   NEXT VISIT: as needed  Delania Ferg B. Dasie SOLON, CSO, LDN Registered Dietitian [phone]

## 2024-10-08 NOTE — Assessment & Plan Note (Signed)
 Acute on chronic.  Associated sciatic pain.  I have referred to orthopedics in June and she has not established care yet Obtain right hip MRI

## 2024-10-08 NOTE — Assessment & Plan Note (Signed)
 History of Stage III. pT3 pN1b cM0, s/p APR. 05/2023 Stage IV current rectal adenocarcinoma Currently on adjuvant chemotherapy. S/p FOLFOX x 4 cycles S/p concurrent chemotherapy [Xeloda  1300 mg BID] with RT, and then another 4 cycles of adjuvant FOLFOX [dose reduced oxaliplatin  and omit 5-FU bolus 05/2023 CT imaging indicates disease progression. liver biopsy pathology showed metastatic carcinoma--> 06/2023 FOLFIRI + Bevacizumab --> 10/2023 and 02/2024 CT partial response --> 05/2024 CT stable - Shared decision was made to hold off irinotecan  given stable disease--> 08/2024 CT stable.  NGS showed TP53 missense variant, APC frameshift, TCF7L2 Frameshift, FLT3 copy number gain., TMB 4.7, pMMR Wildtype KRAS/NRAS  Labs are reviewed and discussed with patient. Recent Signatera circulating tumor DNA is positive.  CT showed stable disease.  Proceed with 5-Fu + Bevacizumab 

## 2024-10-08 NOTE — Addendum Note (Signed)
 Addended by: BABARA CALL on: 10/08/2024 01:34 PM   Modules accepted: Orders

## 2024-10-08 NOTE — Assessment & Plan Note (Signed)
 Grade 2  Continue gabapentin 300mg  BID.

## 2024-10-08 NOTE — Assessment & Plan Note (Signed)
 Chemotherapy plan as listed above

## 2024-10-08 NOTE — Progress Notes (Signed)
 Hematology/Oncology Progress note [phone] [phone]      CHIEF COMPLAINTS/REASON FOR VISIT:  Follow-up for Stage IV  rectal cancer treatments.  ASSESSMENT & PLAN:   Cancer Staging  Rectal cancer [facility]) Staging form: Colon and Rectum, AJCC 8th Edition - Pathologic stage from 05/03/2022: Stage IIIB (pT3, pN1b, cM0) - Signed by Babara Call, MD on 05/03/2022 - Pathologic stage from 06/18/2023: Stage IVA (rpTX, rpN0, rpM1a) - Signed by Babara Call, MD on 06/18/2023   Rectal cancer [facility]) History of Stage III. pT3 pN1b cM0, s/p APR. 05/2023 Stage IV current rectal adenocarcinoma Currently on adjuvant chemotherapy. S/p FOLFOX x 4 cycles S/p concurrent chemotherapy [Xeloda  1300 mg BID] with RT, and then another 4 cycles of adjuvant FOLFOX [dose reduced oxaliplatin  and omit 5-FU bolus 05/2023 CT imaging indicates disease progression. liver biopsy pathology showed metastatic carcinoma--> 06/2023 FOLFIRI + Bevacizumab --> 10/2023 and 02/2024 CT partial response --> 05/2024 CT stable - Shared decision was made to hold off irinotecan  given stable disease--> 08/2024 CT stable.  NGS showed TP53 missense variant, APC frameshift, TCF7L2 Frameshift, FLT3 copy number gain., TMB 4.7, pMMR Wildtype KRAS/NRAS  Labs are reviewed and discussed with patient. Recent Signatera circulating tumor DNA is positive.  CT showed stable disease.  Proceed with 5-Fu + Bevacizumab      Chemotherapy-induced neuropathy Grade 2  Continue gabapentin  300mg  BID.   Encounter for antineoplastic chemotherapy Chemotherapy plan as listed above  Hip pain Acute on chronic.  Associated sciatic pain.  I have referred to orthopedics in June and she has not established care yet Obtain right hip MRI   Orders Placed This Encounter  Procedures   MR HIP RIGHT W WO CONTRAST    Standing Status:   Future    Expected Date:   10/15/2024    Expiration Date:   10/08/2025    If indicated for the ordered procedure, I  authorize the administration of contrast media per Radiology protocol:   Yes    What is the patient's sedation requirement?:   No Sedation    Does the patient have a pacemaker or implanted devices?:   No    Preferred imaging location?:   [facility] (table limit - 500lbs)    Follow-up 2 week(s)  All questions were answered. The patient knows to call the clinic with any problems, questions or concerns.  Call Babara, MD, PhD [facility] Hematology Oncology 10/08/2024     HISTORY OF PRESENTING ILLNESS:   April Luna is a  53 y.o.  female presents for treatment of rectal cancer Oncology History  Rectal cancer (HCC)  02/26/2022 Imaging   MRI PELVIS WITHOUT CONTRAST- By imaging, rectal cancer stage:  T1/T2, N0, Mx    03/02/2022 Imaging   CT CHEST AND ABDOMEN WITH CONTRAST 1. No convincing evidence of metastatic disease within the chest or abdomen. 2. Atrophic left kidney with multifocal renal scarring and cortical calcifications as well as nonobstructive renal stones measuring up to 5 mm. 3. Prominent left-sided predominant retroperitoneal lymph nodes measuring up to 8 mm near the level of the renal hilum, overall decreased in size dating back to CT September 19, 2018 and favored reactive related to left renal inflammation. 4.  Aortic Atherosclerosis (ICD10-I70.0).   03/27/2022 Genetic Testing    [facility] [employer]+RNA cancer panel found no pathogenic mutations.    04/21/2022 Initial Diagnosis   Rectal cancer - baseline CEA 3.6 -02/09/2022, patient had colonoscopy which showed renal mass 10 cm from anal verge.  5 mm polyp in  ascending colon.  Removed and retrieved. Pathology showed rectal adenocarcinoma.  The polyp in the ascending colon is a tubular adenoma.  MRI showed cT1/T2N0 disease  -04/21/2022, patient underwent robotic assisted ultralow anterior resection. Pathology showed moderately differentiated adenocarcinoma, 4.5 cm in maximal extent, with focal extension through  muscularis propria into perirectal soft tissue.  3 lymph nodes positive for metastatic carcinoma.  Negative margin.  pT3 pN1b, MSI stable.   05/03/2022 Cancer Staging   Staging form: Colon and Rectum, AJCC 8th Edition - Pathologic stage from 05/03/2022: Stage IIIB (pT3, pN1b, cM0) - Signed by Babara Call, MD on 05/03/2022 Stage prefix: Initial diagnosis   05/11/2022 Miscellaneous   Medi port placed by Dr.White   05/26/2022 -  Chemotherapy   FOLFOX Q2 weeks x 4   05/26/2022 - 07/09/2022 Chemotherapy   Patient is on Treatment Plan : COLORECTAL FOLFOX q14d x 8 cycles     05/26/2022 - 12/13/2022 Chemotherapy   Patient is on Treatment Plan : COLORECTAL FOLFOX q14d x 4 months     07/27/2022 -  Chemotherapy   Concurrent chemotherapy- Xeloda   1300mg  BID and radiation.    07/27/2022 - 09/12/2022 Radiation Therapy    concurrent chemotherapy [Xeloda  1300 mg BID] with RT    09/20/2022 Imaging   CT Angiogram chest PET protocol There is no evidence of pulmonary artery embolism. There is no evidence of thoracic aortic dissection.   Small linear patchy alveolar infiltrate is seen in medial segment of right middle lobe suggesting atelectasis/pneumonia.     09/28/2022 - 09/30/2022 Hospital Admission   Admission due to acute respiratory failure with hypoxia, COPD exacerbation   09/28/2022 Imaging   CT angio chest PE protocol 1. Negative for acute PE or thoracic aortic dissection. 2. New ground-glass infiltrates anteriorly in bilateral upper lobes,may represent atypical edema, infectious or inflammatory process. 3.  Aortic Atherosclerosis     12/22/2022 Imaging   CT chest abdomen pelvis w contrast  Stable exam. No evidence of recurrent or metastatic carcinoma within the chest, abdomen, or pelvis.   Left nephrolithiasis and renal parenchymal atrophy. No evidence of ureteral calculi or hydronephrosis.   Aortic Atherosclerosis   05/31/2023 Imaging   CT chest abdomen pelvis with contrast showed 1. New  hypodense 16 mm lesion in the right lobe of the liver with very subtle adjacent 5 mm lesion, suspicious for metastatic disease. 2. Small bilateral pulmonary nodules, some of which were subtly evident on prior examination only in retrospect, some of which demonstrate degree of cavitation, suspicious for metastatic disease. 3. Prominent retroperitoneal lymph nodes are similar prior. 4. Prior low anterior resection with Hartmann's pouch formation,similar small amount of perirectal/presacral soft tissue and fluid,favored postsurgical/posttreatment change. Suggest continued attention on follow-up imaging. 5. Large volume of formed stool in the colon. 6. Nonobstructive left renal calculi measure under 1 cm.     06/08/2023 Relapse/Recurrence   Ultrasound-guided liver biopsy showed Pathology showed metastatic carcinoma, morphology consistent with patient's clinical history of rectal adenocarcinoma.  [facility] NGS 648 gene panel showed TP53 missense variant, APC frameshift, TCF7L2 frameshift, FLT3 copy number gain, TMB 4.19m/MB MSI stable.  Wildtype KRAS/NRAS  [facility] RNA Seq - ERBB3 overexpressed, VEGFA overexpressed, NRAS overexpressed.     06/18/2023 Cancer Staging   Staging form: Colon and Rectum, AJCC 8th Edition - Pathologic stage from 06/18/2023: Stage IVA (rpTX, pN0, pM1a) - Signed by Babara Call, MD on 06/18/2023 Stage prefix: Recurrence Total positive nodes: 0   07/02/2023 - 06/04/2024 Chemotherapy   COLORECTAL FOLFIRI + Bevacizumab   q14d      11/13/2023 Imaging   CT chest abdomen pelvis w contrast showed  1. Increase in cavitation with decreased soft tissue involving the scattered bilateral pulmonary nodules, consistent with treatment response. No new suspicious pulmonary nodules or masses. 2. Decrease in size and conspicuity of the hypodense lesions in the right lobe of the liver, consistent with treatment response. No new suspicious hepatic lesion identified. 3. Stable prominent nonspecific  retroperitoneal lymph nodes. Suggest continued attention on follow-up imaging. 4. Similar surgical changes of prior low anterior resection with left anterior abdominal wall colostomy. Similar mesorectal/presacral soft tissue, no new suspicious enhancing nodularity. 5. Similar soft tissue nodularity in the bilateral posterior gluteal subcutaneous soft tissues. 6. Moderate volume of formed stool in the colon. Correlate for constipation.   03/03/2024 Imaging   CT chest abdomen pelvis w contrast showed 1. 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 2. No new or progressive disease. 3. Similar prominent but not pathologically sized abdominal retroperitoneal nodes, nonspecific. 4. Status post low anterior resection with descending colostomy. Possible constipation. 5. Age advanced coronary artery atherosclerosis. Recommend assessment of coronary risk factors. 6.  Aortic Atherosclerosis (ICD10-I70.0). 7. Left nephrolithiasis and renal scarring.    05/27/2024 Imaging   CT chest abdomen pelvis w contrast showed 1. Status post abdominoperineal resection with left lower quadrant end colostomy. 2. Unchanged enlarged retroperitoneal lymph nodes. 3. Unchanged small hypodense lesion of the posterior right lobe of the liver. 4. Very tiny, irregular cavitary nodules are unchanged. 5. New areas of irregular atelectasis or consolidation of the peripheral right lower lobe, nonspecific and most likely infectious or inflammatory. Metastatic disease distinctly not favored. Attention on follow-up. 6. Atrophic, scarred left kidney with nonobstructive calculi of the inferior pole. No hydronephrosis.   Aortic Atherosclerosis (ICD10-I70.0).     06/18/2024 -  Chemotherapy   5-FU and Bevacizumab  maintenance.    09/01/2024 Imaging   CT chest abdomen pelvis w contrast showed 1. Status post abdominoperineal resection with left lower quadrant end colostomy. No evidence of local  recurrence. 2. Stable mildly enlarged/prominent retroperitoneal lymph nodes. 3. Stable tiny irregular cavitary pulmonary nodules. 4. Unchanged small hypodense lesion in the posterior right lobe of the liver. 5. No evidence of new or progressive disease in the chest, abdomen or pelvis. 6. Aortic atherosclerosis.   Aortic Atherosclerosis (ICD10-I70.0).     Patient has bipolar and schizophrenia..  Patient is married  She has housing issue due to previous history of eviction.  she does not live with her husband currently. She lives with some family members.   + chronic dysuria and urgency she was seen by Urology and was started on Gemteza.  Symptoms are improved.    INTERVAL HISTORY SHAMMARA JARRETT is a 53 y.o. female who has above history reviewed by me today presents for follow up visit for rectal cancer.   Denies any melena or blood in the stool. Stable neuropathy symptoms. + loose BM, stable baseline.  + Muscle pain,  Appetite is fair.  She has bilateral acute on chronic hip pain. She cancelled her orthopedic appointment. She plans to re schedule.   Review of Systems  Constitutional:  Positive for fatigue. Negative for appetite change, chills and fever.  HENT:   Negative for hearing loss, mouth sores and voice change.   Eyes:  Negative for eye problems.  Respiratory:  Negative for chest tightness, cough and shortness of breath.   Cardiovascular:  Negative for chest pain.  Gastrointestinal:  Positive for diarrhea.  Negative for abdominal distention, abdominal pain, blood in stool and nausea.       + colostomy   Endocrine: Negative for hot flashes.  Genitourinary:  Negative for difficulty urinating, dysuria and frequency.   Musculoskeletal:  Positive for arthralgias.  Skin:  Negative for itching and rash.  Neurological:  Positive for numbness. Negative for extremity weakness.  Hematological:  Negative for adenopathy.  Psychiatric/Behavioral:  Negative for confusion.      MEDICAL HISTORY:  Past Medical History:  Diagnosis Date   Allergy    pollen   Anxiety    Arthritis    right hip   Bipolar 1 disorder (HCC)    Cancer (HCC)    rectal   Chemotherapy-induced neuropathy 10/24/2022   Chronic kidney disease    COPD (chronic obstructive pulmonary disease) (HCC)    Depression    Family history of adverse reaction to anesthesia    grand father had a stroke during anesthesia   Family history of breast cancer    Family history of colon cancer    Family history of uterine cancer    GERD (gastroesophageal reflux disease)    History of kidney stones    Hyperlipidemia    Hypertension    Hypothyroidism    Panic attack    Pneumonia    Psoriasis    Sleep apnea 08/11/2021   No CPAP   Type 2 diabetes mellitus with microalbuminuria, without long-term current use of insulin  (HCC) 06/24/2019    SURGICAL HISTORY: Past Surgical History:  Procedure Laterality Date   BREAST BIOPSY Left 12/14/2021   us  bx, venus marker, path pending   CESAREAN SECTION     COLONOSCOPY WITH PROPOFOL  N/A 02/09/2022   Procedure: COLONOSCOPY WITH PROPOFOL ;  Surgeon: Unk Corinn Skiff, MD;  Location: [facility];  Service: Endoscopy;  Laterality: N/A;  sleep apnea   CYSTOSCOPY W/ RETROGRADES Left 11/08/2018   Procedure: CYSTOSCOPY WITH RETROGRADE PYELOGRAM;  Surgeon: Francisca Redell BROCKS, MD;  Location: [facility];  Service: Urology;  Laterality: Left;   CYSTOSCOPY/URETEROSCOPY/HOLMIUM LASER/STENT PLACEMENT Left 11/08/2018   Procedure: CYSTOSCOPY/URETEROSCOPY/HOLMIUM LASER/STENT PLACEMENT;  Surgeon: Francisca Redell BROCKS, MD;  Location: [facility];  Service: Urology;  Laterality: Left;   IR CV LINE INJECTION  06/25/2023   IR IMAGING GUIDED PORT INSERTION  05/11/2022   IR REMOVE CV FIBRIN SHEATH  06/27/2023   MOUTH SURGERY     wisdom teeth extraction   MOUTH SURGERY     teeth removal   POLYPECTOMY N/A 02/09/2022   Procedure: POLYPECTOMY;  Surgeon: Unk Corinn Skiff, MD;  Location:  [facility];  Service: Endoscopy;  Laterality: N/A;   XI ROBOTIC ASSISTED LOWER ANTERIOR RESECTION N/A 04/21/2022   Procedure: XI ROBOTIC ASSISTED LOWER ANTERIOR RESECTION WITH COLOSTOMY, BILATERAL TAP BLOCK, ASSESSMENT OF TISSUE PERFUSSION WITH FIREFLY INJECTION;  Surgeon: Teresa Lonni HERO, MD;  Location: [facility];  Service: General;  Laterality: N/A;    SOCIAL HISTORY: Social History   Socioeconomic History   Marital status: Married    Spouse name: Morene Jama Pereyra   Number of children: 1   Years of education: 12   Highest education level: High school graduate  Occupational History   Occupation: unemployed    Comment: disabled  Tobacco Use   Smoking status: Some Days    Current packs/day: 0.25    Average packs/day: 0.3 packs/day for 39.4 years (9.9 ttl pk-yrs)    Types: Cigarettes    Start date: 05/13/1985    Passive exposure: Current  Smokeless tobacco: Never   Tobacco comments:    5-6 cigarettes weekly- 11/07/2022  Vaping Use   Vaping status: Former   Start date: 05/25/2018   Quit date: 09/24/2018  Substance and Sexual Activity   Alcohol use: Not Currently    Alcohol/week: 4.0 standard drinks of alcohol    Types: 4 Glasses of wine per week    Comment: quit ETOH in Feb. 2023   Drug use: Yes    Types: Marijuana    Comment: 2 days ago   Sexual activity: Not Currently    Birth control/protection: Post-menopausal  Other Topics Concern   Not on file  Social History Narrative   Lives with husband and son    Social Drivers of Health   Financial Resource Strain: Medium Risk (09/04/2024)   Overall Financial Resource Strain (CARDIA)    Difficulty of Paying Living Expenses: Somewhat hard  Food Insecurity: Food Insecurity Present (09/04/2024)   Hunger Vital Sign    Worried About Running Out of Food in the Last Year: Sometimes true    Ran Out of Food in the Last Year: Sometimes true  Transportation Needs: No Transportation Needs (09/04/2024)   PRAPARE -  Administrator, Civil Service (Medical): No    Lack of Transportation (Non-Medical): No  Physical Activity: Inactive (09/04/2024)   Exercise Vital Sign    Days of Exercise per Week: 0 days    Minutes of Exercise per Session: 0 min  Stress: Stress Concern Present (09/04/2024)   [employer] of Occupational Health - Occupational Stress Questionnaire    Feeling of Stress: To some extent  Social Connections: Moderately Integrated (09/04/2024)   Social Connection and Isolation Panel    Frequency of Communication with Friends and Family: More than three times a week    Frequency of Social Gatherings with Friends and Family: More than three times a week    Attends Religious Services: More than 4 times per year    Active Member of [employer] or Organizations: No    Attends Banker Meetings: Never    Marital Status: Married  Catering manager Violence: Not At Risk (09/04/2024)   Humiliation, Afraid, Rape, and Kick questionnaire    Fear of Current or Ex-Partner: No    Emotionally Abused: No    Physically Abused: No    Sexually Abused: No    FAMILY HISTORY: Family History  Problem Relation Age of Onset   Depression Mother    Anxiety disorder Mother    Diabetes Mother    Hypertension Mother    Hyperlipidemia Mother    Cancer Mother    Uterine cancer Mother 33   Cervical cancer Mother 73   Colon cancer Father    Depression Brother    Anxiety disorder Brother    Cancer Maternal Aunt        unk types   Diabetes Mellitus II Maternal Grandmother    Hypercholesterolemia Maternal Grandmother    Breast cancer Maternal Grandmother    Cancer Paternal Grandmother    Diabetes Paternal Grandmother    Melanoma Paternal Grandmother    Stomach cancer Paternal Grandmother     ALLERGIES:  is allergic to metformin and related, nsaids, perphenazine, sulfa antibiotics, abilify [aripiprazole], and penicillins.  MEDICATIONS:  Current Outpatient Medications  Medication Sig  Dispense Refill   acetaminophen  (TYLENOL ) 325 MG tablet Take 650 mg by mouth every 6 (six) hours as needed for headache (pain). (Patient not taking: Reported on 10/08/2024)     albuterol  (VENTOLIN   HFA) 108 (90 Base) MCG/ACT inhaler Inhale 2 puffs into the lungs every 6 (six) hours as needed for wheezing or shortness of breath. (Patient not taking: Reported on 10/08/2024) 1 each 5   amantadine  (SYMMETREL ) 100 MG capsule Take 100 mg by mouth 2 (two) times daily. (Patient not taking: Reported on 10/08/2024)     blood glucose meter kit and supplies Dispense based on patient and insurance preference. Use up to four times daily as directed. (FOR ICD-10 E10.9, E11.9). (Patient not taking: Reported on 10/08/2024) 1 each 0   Budeson-Glycopyrrol-Formoterol  (BREZTRI  AEROSPHERE) 160-9-4.8 MCG/ACT AERO Inhale 2 puffs into the lungs in the morning and at bedtime. (Patient not taking: Reported on 10/08/2024) 10.7 g 2   buPROPion  (WELLBUTRIN  XL) 300 MG 24 hr tablet Take 300 mg by mouth daily. (Patient not taking: Reported on 10/08/2024)     Cholecalciferol  (VITAMIN D -3) 125 MCG (5000 UT) TABS Take 5,000 Units by mouth daily. (Patient not taking: Reported on 10/08/2024) 30 tablet 1   clonazePAM (KLONOPIN) 0.5 MG tablet Take 1 mg by mouth 2 (two) times daily as needed. (Patient not taking: Reported on 10/08/2024)     dexamethasone  (DECADRON ) 4 MG tablet Take 2 tablets (8 mg total) by mouth See admin instructions. Take 8mg  daily for 2 days after each chemotherapy. (Patient not taking: Reported on 10/08/2024) 30 tablet 1   diltiazem  (CARDIZEM  CD) 120 MG 24 hr capsule TAKE 1 CAPSULE BY MOUTH DAILY (Patient not taking: Reported on 10/08/2024) 90 capsule 1   docusate sodium  (COLACE) 100 MG capsule TAKE 1 CAPSULE BY MOUTH TWICE DAILY (Patient not taking: Reported on 10/08/2024) 60 capsule 2   FARXIGA  10 MG TABS tablet TAKE 1 TABLET BY MOUTH DAILY BEFORE BREAKFAST (Patient not taking: Reported on 10/08/2024) 90 tablet 0    gabapentin  (NEURONTIN ) 300 MG capsule Take 300 mg by mouth 2 (two) times daily. (Patient not taking: Reported on 10/08/2024)     icosapent  Ethyl (VASCEPA ) 1 g capsule Take 2 capsules (2 g total) by mouth in the morning and at bedtime. (Patient not taking: Reported on 10/08/2024) 120 capsule 2   levothyroxine  (SYNTHROID ) 75 MCG tablet TAKE 1 TABLET BY MOUTH DAILY BEFORE BREAKFAST (Patient not taking: Reported on 10/08/2024) 90 tablet 2   lidocaine -prilocaine  (EMLA ) cream Apply to affected area once (Patient not taking: Reported on 10/08/2024) 30 g 3   loperamide  (IMODIUM ) 2 MG capsule Take 1 capsule (2 mg total) by mouth See admin instructions. Initial: 4 mg,the 2 mg every 2 hours (4 mg every 4 hours at night)  maximum: 16 mg/day (Patient not taking: Reported on 10/08/2024) 120 capsule 2   loratadine  (CLARITIN ) 10 MG tablet Take 10 mg by mouth every morning. (Patient not taking: Reported on 10/08/2024)     magic mouthwash w/lidocaine  SOLN Take 5 mLs by mouth 4 (four) times daily as needed for mouth pain. Sig: Swish/Swallow 5-10 ml four times a day as needed. Dispense 480 ml. 1RF (Patient not taking: Reported on 10/08/2024) 480 mL 1   ondansetron  (ZOFRAN ) 8 MG tablet Take 1 tablet (8 mg total) by mouth every 8 (eight) hours as needed for nausea, vomiting or refractory nausea / vomiting. Start on the third day after chemotherapy. (Patient not taking: Reported on 10/08/2024) 30 tablet 1   paliperidone  (INVEGA  SUSTENNA) 234 MG/1.5ML SUSY injection Inject 234 mg into the muscle every 30 (thirty) days. On or about the 14th of each month (Patient not taking: Reported on 10/08/2024)     pantoprazole  (PROTONIX )  40 MG tablet TAKE 1 TABLET BY MOUTH DAILY (Patient not taking: Reported on 10/08/2024) 90 tablet 2   pioglitazone  (ACTOS ) 15 MG tablet Take 1 tablet (15 mg total) by mouth daily. (Patient not taking: Reported on 10/08/2024) 90 tablet 1   potassium chloride  SA (KLOR-CON  M) 20 MEQ tablet Take 1 tablet (20 mEq  total) by mouth daily. (Patient not taking: Reported on 10/08/2024) 30 tablet 0   promethazine  (PHENERGAN ) 12.5 MG tablet Take 1 tablet (12.5 mg total) by mouth every 6 (six) hours as needed for nausea or vomiting. (Patient not taking: Reported on 10/08/2024) 30 tablet 0   rosuvastatin  (CRESTOR ) 40 MG tablet TAKE 1 TABLET BY MOUTH EVERY MORNING (Patient not taking: Reported on 10/08/2024) 90 tablet 0   sertraline  (ZOLOFT ) 100 MG tablet Take 200 mg by mouth every morning. (Patient not taking: Reported on 10/08/2024)     Spacer/Aero-Holding Chambers (AEROCHAMBER MV) inhaler Use as instructed (Patient not taking: Reported on 10/08/2024) 1 each 0   Vibegron  (GEMTESA ) 75 MG TABS Take 1 tablet (75 mg total) by mouth daily. (Patient not taking: Reported on 10/08/2024)     No current facility-administered medications for this visit.   Facility-Administered Medications Ordered in Other Visits  Medication Dose Route Frequency Provider Last Rate Last Admin   acetaminophen  (TYLENOL ) tablet 650 mg  650 mg Oral PRN Babara Call, MD       albuterol  (PROVENTIL ) (2.5 MG/3ML) 0.083% nebulizer solution 2.5 mg  2.5 mg Nebulization Once Dgayli, Khabib, MD       fluorouracil  (ADRUCIL ) 3,500 mg in sodium chloride  0.9 % 80 mL chemo infusion  2,400 mg/m2 (Order-Specific) Intravenous 1 day or 1 dose Babara Call, MD   Infusion Verify at 10/08/24 1214   ondansetron  (ZOFRAN ) injection 8 mg  8 mg Intravenous PRN Kymora Sciara, MD   8 mg at 10/08/24 9057   sodium chloride  flush (NS) 0.9 % injection 10 mL  10 mL Intracatheter PRN Babara Call, MD   10 mL at 08/15/23 1327   sodium chloride  flush (NS) 0.9 % injection 10 mL  10 mL Intracatheter PRN Babara Call, MD   10 mL at 03/26/24 1255     PHYSICAL EXAMINATION: ECOG PERFORMANCE STATUS: 0 - Asymptomatic Vitals:   10/08/24 0900  BP: 128/84  Pulse: 84  Resp: 16  Temp: (!) 97.4 F (36.3 C)  SpO2: 94%     Filed Weights   10/08/24 0900  Weight: 122 lb (55.3 kg)      Physical  Exam Constitutional:      General: She is not in acute distress. HENT:     Head: Normocephalic and atraumatic.  Eyes:     General: No scleral icterus. Cardiovascular:     Rate and Rhythm: Normal rate and regular rhythm.  Pulmonary:     Effort: Pulmonary effort is normal. No respiratory distress.     Breath sounds: No wheezing.     Comments: Decreased breath sound bilaterally Abdominal:     General: Bowel sounds are normal. There is no distension.     Palpations: Abdomen is soft.     Comments: +colostomy   Musculoskeletal:        General: No deformity. Normal range of motion.     Cervical back: Normal range of motion and neck supple.  Skin:    General: Skin is warm and dry.     Findings: No erythema or rash.  Neurological:     Mental Status: She is alert and oriented to  person, place, and time. Mental status is at baseline.     Cranial Nerves: No cranial nerve deficit.     Coordination: Coordination normal.  Psychiatric:        Mood and Affect: Mood normal.     LABORATORY DATA:  I have reviewed the data as listed    Latest Ref Rng & Units 10/08/2024    8:44 AM 09/24/2024    8:27 AM 09/10/2024    8:39 AM  CBC  WBC 4.0 - 10.5 K/uL 6.2  9.7  6.0   Hemoglobin 12.0 - 15.0 g/dL 86.3  85.6  85.0   Hematocrit 36.0 - 46.0 % 40.4  41.3  42.9   Platelets 150 - 400 K/uL 142  115  142       Latest Ref Rng & Units 10/08/2024    8:44 AM 09/24/2024    8:27 AM 09/10/2024    8:39 AM  CMP  Glucose 70 - 99 mg/dL 887  839  876   BUN 6 - 20 mg/dL 19  10  15    Creatinine 0.44 - 1.00 mg/dL 9.01  8.81  8.90   Sodium 135 - 145 mmol/L 136  135  137   Potassium 3.5 - 5.1 mmol/L 4.4  3.7  4.2   Chloride 98 - 111 mmol/L 104  105  102   CO2 22 - 32 mmol/L 25  22  24    Calcium  8.9 - 10.3 mg/dL 9.0  8.8  9.1   Total Protein 6.5 - 8.1 g/dL 6.5  6.6  6.9   Total Bilirubin 0.0 - 1.2 mg/dL 0.4  0.7  0.5   Alkaline Phos 38 - 126 U/L 73  79  80   AST 15 - 41 U/L 22  27  21    ALT 0 - 44 U/L 12  12   10          RADIOGRAPHIC STUDIES: I have personally reviewed the radiological images as listed and agreed with the findings in the report. No results found.

## 2024-10-09 ENCOUNTER — Encounter: Payer: Self-pay | Admitting: Oncology

## 2024-10-09 ENCOUNTER — Encounter: Payer: Self-pay | Admitting: Nurse Practitioner

## 2024-10-09 LAB — CEA: CEA: 5.3 ng/mL — ABNORMAL HIGH (ref 0.0–4.7)

## 2024-10-10 ENCOUNTER — Inpatient Hospital Stay

## 2024-10-15 ENCOUNTER — Inpatient Hospital Stay

## 2024-10-15 ENCOUNTER — Ambulatory Visit
Admission: RE | Admit: 2024-10-15 | Discharge: 2024-10-15 | Disposition: A | Discharge status: Home / Self Care | Source: Ambulatory Visit | Attending: Oncology | Admitting: Oncology

## 2024-10-15 DIAGNOSIS — M25551 Pain in right hip: Secondary | ICD-10-CM | POA: Diagnosis present

## 2024-10-15 MED ORDER — GADOBUTROL 1 MMOL/ML IV SOLN
5.0000 mL | Freq: Once | INTRAVENOUS | Status: AC | PRN
  Administered 2024-10-15: 5 mL via INTRAVENOUS

## 2024-10-15 MED ORDER — HEPARIN SOD (PORK) LOCK FLUSH 100 UNIT/ML IV SOLN
INTRAVENOUS | Status: AC
  Filled 2024-10-15: qty 5

## 2024-10-15 MED ORDER — HEPARIN SOD (PORK) LOCK FLUSH 100 UNIT/ML IV SOLN
500.0000 [IU] | Freq: Once | INTRAVENOUS | Status: DC
Start: 2024-10-15 — End: 2024-10-16

## 2024-10-20 ENCOUNTER — Encounter: Payer: Self-pay | Admitting: Oncology

## 2024-10-21 ENCOUNTER — Encounter: Payer: Self-pay | Admitting: Nurse Practitioner

## 2024-10-21 ENCOUNTER — Encounter: Payer: Self-pay | Admitting: Oncology

## 2024-10-22 ENCOUNTER — Inpatient Hospital Stay

## 2024-10-22 ENCOUNTER — Encounter: Payer: Self-pay | Admitting: Oncology

## 2024-10-22 ENCOUNTER — Inpatient Hospital Stay (HOSPITAL_BASED_OUTPATIENT_CLINIC_OR_DEPARTMENT_OTHER): Admitting: Oncology

## 2024-10-22 ENCOUNTER — Other Ambulatory Visit: Payer: Self-pay | Admitting: Family Medicine

## 2024-10-22 ENCOUNTER — Inpatient Hospital Stay: Admitting: Licensed Clinical Social Worker

## 2024-10-22 VITALS — BP 148/86 | HR 83 | Temp 97.0°F | Resp 16 | Wt 117.0 lb

## 2024-10-22 DIAGNOSIS — M25551 Pain in right hip: Secondary | ICD-10-CM | POA: Diagnosis not present

## 2024-10-22 DIAGNOSIS — C20 Malignant neoplasm of rectum: Secondary | ICD-10-CM

## 2024-10-22 DIAGNOSIS — J441 Chronic obstructive pulmonary disease with (acute) exacerbation: Secondary | ICD-10-CM

## 2024-10-22 DIAGNOSIS — T451X5A Adverse effect of antineoplastic and immunosuppressive drugs, initial encounter: Secondary | ICD-10-CM

## 2024-10-22 DIAGNOSIS — E032 Hypothyroidism due to medicaments and other exogenous substances: Secondary | ICD-10-CM | POA: Diagnosis not present

## 2024-10-22 DIAGNOSIS — Z5111 Encounter for antineoplastic chemotherapy: Secondary | ICD-10-CM

## 2024-10-22 DIAGNOSIS — G620 Drug-induced polyneuropathy: Secondary | ICD-10-CM

## 2024-10-22 DIAGNOSIS — J411 Mucopurulent chronic bronchitis: Secondary | ICD-10-CM

## 2024-10-22 LAB — CMP (CANCER CENTER ONLY)
ALT: 19 U/L (ref 0–44)
AST: 27 U/L (ref 15–41)
Albumin: 3.5 g/dL (ref 3.5–5.0)
Alkaline Phosphatase: 79 U/L (ref 38–126)
Anion gap: 10 (ref 5–15)
BUN: 11 mg/dL (ref 6–20)
CO2: 22 mmol/L (ref 22–32)
Calcium: 8.7 mg/dL — ABNORMAL LOW (ref 8.9–10.3)
Chloride: 105 mmol/L (ref 98–111)
Creatinine: 0.94 mg/dL (ref 0.44–1.00)
GFR, Estimated: >60 mL/min (ref >60)
Glucose, Bld: 132 mg/dL — ABNORMAL HIGH (ref 70–99)
Potassium: 4.2 mmol/L (ref 3.5–5.1)
Sodium: 137 mmol/L (ref 135–145)
Total Bilirubin: 0.5 mg/dL (ref 0.0–1.2)
Total Protein: 6.3 g/dL — ABNORMAL LOW (ref 6.5–8.1)

## 2024-10-22 LAB — CBC WITH DIFFERENTIAL (CANCER CENTER ONLY)
Abs Immature Granulocytes: 0.03 K/uL (ref 0.00–0.07)
Basophils Absolute: 0.1 K/uL (ref 0.0–0.1)
Basophils Relative: 1 %
Eosinophils Absolute: 0.3 K/uL (ref 0.0–0.5)
Eosinophils Relative: 4 %
HCT: 39.7 % (ref 36.0–46.0)
Hemoglobin: 13.2 g/dL (ref 12.0–15.0)
Immature Granulocytes: 0 %
Lymphocytes Relative: 12 %
Lymphs Abs: 0.8 K/uL (ref 0.7–4.0)
MCH: 34.3 pg — ABNORMAL HIGH (ref 26.0–34.0)
MCHC: 33.2 g/dL (ref 30.0–36.0)
MCV: 103.1 fL — ABNORMAL HIGH (ref 80.0–100.0)
Monocytes Absolute: 0.9 K/uL (ref 0.1–1.0)
Monocytes Relative: 12 %
Neutro Abs: 4.9 K/uL (ref 1.7–7.7)
Neutrophils Relative %: 71 %
Platelet Count: 130 K/uL — ABNORMAL LOW (ref 150–400)
RBC: 3.85 MIL/uL — ABNORMAL LOW (ref 3.87–5.11)
RDW: 16 % — ABNORMAL HIGH (ref 11.5–15.5)
WBC Count: 7 K/uL (ref 4.0–10.5)
nRBC: 0 % (ref 0.0–0.2)

## 2024-10-22 LAB — TOTAL PROTEIN, URINE DIPSTICK: Protein, ur: 30 mg/dL — AB

## 2024-10-22 MED ORDER — SODIUM CHLORIDE 0.9 % IV SOLN
2400.0000 mg/m2 | INTRAVENOUS | Status: DC
  Administered 2024-10-22: 3500 mg via INTRAVENOUS
  Filled 2024-10-22: qty 70

## 2024-10-22 MED ORDER — DEXAMETHASONE SOD PHOSPHATE PF 10 MG/ML IJ SOLN
10.0000 mg | Freq: Once | INTRAMUSCULAR | Status: AC
  Administered 2024-10-22: 10 mg via INTRAVENOUS

## 2024-10-22 MED ORDER — SODIUM CHLORIDE 0.9 % IV SOLN
400.0000 mg/m2 | Freq: Once | INTRAVENOUS | Status: AC
  Administered 2024-10-22: 596 mg via INTRAVENOUS
  Filled 2024-10-22: qty 29.8

## 2024-10-22 MED ORDER — PREDNISONE 10 MG (21) PO TBPK: ORAL_TABLET | ORAL | 0 refills | Status: DC

## 2024-10-22 MED ORDER — FLUOROURACIL CHEMO INJECTION 2.5 GM/50ML
400.0000 mg/m2 | Freq: Once | INTRAVENOUS | Status: AC
  Administered 2024-10-22: 600 mg via INTRAVENOUS
  Filled 2024-10-22: qty 12

## 2024-10-22 MED ORDER — SODIUM CHLORIDE 0.9 % IV SOLN
5.0000 mg/kg | Freq: Once | INTRAVENOUS | Status: AC
  Administered 2024-10-22: 275 mg via INTRAVENOUS
  Filled 2024-10-22: qty 11

## 2024-10-22 MED ORDER — LEVOTHYROXINE SODIUM 75 MCG PO TABS: 75.0000 ug | ORAL_TABLET | Freq: Every day | ORAL | 2 refills | Status: AC

## 2024-10-22 MED ORDER — ALBUTEROL SULFATE HFA 108 (90 BASE) MCG/ACT IN AERS: 2.0000 | INHALATION_SPRAY | Freq: Four times a day (QID) | RESPIRATORY_TRACT | 5 refills | Status: AC | PRN

## 2024-10-22 MED ORDER — SODIUM CHLORIDE 0.9 % IV SOLN
Freq: Once | INTRAVENOUS | Status: AC
  Filled 2024-10-22: qty 250

## 2024-10-22 MED ORDER — ONDANSETRON HCL 4 MG/2ML IJ SOLN
8.0000 mg | INTRAMUSCULAR | Status: DC | PRN
  Administered 2024-10-22: 8 mg via INTRAVENOUS
  Filled 2024-10-22: qty 4

## 2024-10-22 MED ORDER — ACETAMINOPHEN 325 MG PO TABS
650.0000 mg | ORAL_TABLET | ORAL | Status: DC | PRN
  Filled 2024-10-22: qty 2

## 2024-10-22 NOTE — Assessment & Plan Note (Signed)
 History of Stage III. pT3 pN1b cM0, s/p APR. 05/2023 Stage IV current rectal adenocarcinoma Currently on adjuvant chemotherapy. S/p FOLFOX x 4 cycles S/p concurrent chemotherapy [Xeloda  1300 mg BID] with RT, and then another 4 cycles of adjuvant FOLFOX [dose reduced oxaliplatin  and omit 5-FU bolus 05/2023 CT imaging indicates disease progression. liver biopsy pathology showed metastatic carcinoma--> 06/2023 FOLFIRI + Bevacizumab --> 10/2023 and 02/2024 CT partial response --> 05/2024 CT stable - Shared decision was made to hold off irinotecan  given stable disease--> 08/2024 CT stable.  NGS showed TP53 missense variant, APC frameshift, TCF7L2 Frameshift, FLT3 copy number gain., TMB 4.7, pMMR Wildtype KRAS/NRAS  Labs are reviewed and discussed with patient. Recent Signatera circulating tumor DNA is positive.  CT showed stable disease.  Proceed with 5-Fu + Bevacizumab 

## 2024-10-22 NOTE — Addendum Note (Signed)
 Addended by: BABARA CALL on: 10/22/2024 10:38 AM   Modules accepted: Level of Service

## 2024-10-22 NOTE — Assessment & Plan Note (Signed)
 Recommend patient to use her inhalers and stop smoking Recommend tapering course of prednisone .

## 2024-10-22 NOTE — Progress Notes (Signed)
 Hematology/Oncology Progress note [phone] [phone]) [phone]      CHIEF COMPLAINTS/REASON FOR VISIT:  Follow-up for Stage IV  rectal cancer treatments.  ASSESSMENT & PLAN:   Cancer Staging  Rectal cancer [facility]) Staging form: Colon and Rectum, AJCC 8th Edition - Pathologic stage from 05/03/2022: Stage IIIB (pT3, pN1b, cM0) - Signed by Babara Call, MD on 05/03/2022 - Pathologic stage from 06/18/2023: Stage IVA (rpTX, rpN0, rpM1a) - Signed by Babara Call, MD on 06/18/2023   Rectal cancer [facility]) History of Stage III. pT3 pN1b cM0, s/p APR. 05/2023 Stage IV current rectal adenocarcinoma Currently on adjuvant chemotherapy. S/p FOLFOX x 4 cycles S/p concurrent chemotherapy [Xeloda  1300 mg BID] with RT, and then another 4 cycles of adjuvant FOLFOX [dose reduced oxaliplatin  and omit 5-FU bolus 05/2023 CT imaging indicates disease progression. liver biopsy pathology showed metastatic carcinoma--> 06/2023 FOLFIRI + Bevacizumab --> 10/2023 and 02/2024 CT partial response --> 05/2024 CT stable - Shared decision was made to hold off irinotecan  given stable disease--> 08/2024 CT stable.  NGS showed TP53 missense variant, APC frameshift, TCF7L2 Frameshift, FLT3 copy number gain., TMB 4.7, pMMR Wildtype KRAS/NRAS  Labs are reviewed and discussed with patient. Recent Signatera circulating tumor DNA is positive.  CT showed stable disease.  Proceed with 5-Fu + Bevacizumab      Chemotherapy-induced neuropathy Grade 2  Continue gabapentin  300mg  BID.   Encounter for antineoplastic chemotherapy Chemotherapy plan as listed above  Hip pain Acute on chronic.  Associated sciatic pain.  I have referred to orthopedics in June and she has not established care yet right hip MRI showed DJD   COPD exacerbation (HCC) Recommend patient to use her inhalers and stop smoking Recommend tapering course of prednisone .      Orders Placed This Encounter  Procedures   Total Protein, Urine dipstick     Standing Status:   Future    Expected Date:   11/26/2024    Expiration Date:   11/26/2025   CEA    Standing Status:   Future    Expected Date:   11/26/2024    Expiration Date:   11/26/2025   CBC with Differential (Cancer Center Only)    Standing Status:   Future    Expected Date:   11/26/2024    Expiration Date:   11/26/2025   CMP (Cancer Center only)    Standing Status:   Future    Expected Date:   11/26/2024    Expiration Date:   11/26/2025    Follow-up 2 week(s)  All questions were answered. The patient knows to call the clinic with any problems, questions or concerns.  Call Babara, MD, PhD [facility] Hematology Oncology 10/22/2024     HISTORY OF PRESENTING ILLNESS:   RAYSSA ATHA is a  53 y.o.  female presents for treatment of rectal cancer Oncology History  Rectal cancer (HCC)  02/26/2022 Imaging   MRI PELVIS WITHOUT CONTRAST- By imaging, rectal cancer stage:  T1/T2, N0, Mx    03/02/2022 Imaging   CT CHEST AND ABDOMEN WITH CONTRAST 1. No convincing evidence of metastatic disease within the chest or abdomen. 2. Atrophic left kidney with multifocal renal scarring and cortical calcifications as well as nonobstructive renal stones measuring up to 5 mm. 3. Prominent left-sided predominant retroperitoneal lymph nodes measuring up to 8 mm near the level of the renal hilum, overall decreased in size dating back to CT September 19, 2018 and favored reactive related to left renal inflammation. 4.  Aortic Atherosclerosis (ICD10-I70.0).  03/27/2022 Genetic Testing    [facility] Custom[employer]+RNA cancer panel found no pathogenic mutations.    04/21/2022 Initial Diagnosis   Rectal cancer - baseline CEA 3.6 -02/09/2022, patient had colonoscopy which showed renal mass 10 cm from anal verge.  5 mm polyp in ascending colon.  Removed and retrieved. Pathology showed rectal adenocarcinoma.  The polyp in the ascending colon is a tubular adenoma.  MRI showed cT1/T2N0 disease  -04/21/2022, patient  underwent robotic assisted ultralow anterior resection. Pathology showed moderately differentiated adenocarcinoma, 4.5 cm in maximal extent, with focal extension through muscularis propria into perirectal soft tissue.  3 lymph nodes positive for metastatic carcinoma.  Negative margin.  pT3 pN1b, MSI stable.   05/03/2022 Cancer Staging   Staging form: Colon and Rectum, AJCC 8th Edition - Pathologic stage from 05/03/2022: Stage IIIB (pT3, pN1b, cM0) - Signed by Babara Call, MD on 05/03/2022 Stage prefix: Initial diagnosis   05/11/2022 Miscellaneous   Medi port placed by Dr.White   05/26/2022 -  Chemotherapy   FOLFOX Q2 weeks x 4   05/26/2022 - 07/09/2022 Chemotherapy   Patient is on Treatment Plan : COLORECTAL FOLFOX q14d x 8 cycles     05/26/2022 - 12/13/2022 Chemotherapy   Patient is on Treatment Plan : COLORECTAL FOLFOX q14d x 4 months     07/27/2022 -  Chemotherapy   Concurrent chemotherapy- Xeloda   1300mg  BID and radiation.    07/27/2022 - 09/12/2022 Radiation Therapy    concurrent chemotherapy [Xeloda  1300 mg BID] with RT    09/20/2022 Imaging   CT Angiogram chest PET protocol There is no evidence of pulmonary artery embolism. There is no evidence of thoracic aortic dissection.   Small linear patchy alveolar infiltrate is seen in medial segment of right middle lobe suggesting atelectasis/pneumonia.     09/28/2022 - 09/30/2022 Hospital Admission   Admission due to acute respiratory failure with hypoxia, COPD exacerbation   09/28/2022 Imaging   CT angio chest PE protocol 1. Negative for acute PE or thoracic aortic dissection. 2. New ground-glass infiltrates anteriorly in bilateral upper lobes,may represent atypical edema, infectious or inflammatory process. 3.  Aortic Atherosclerosis     12/22/2022 Imaging   CT chest abdomen pelvis w contrast  Stable exam. No evidence of recurrent or metastatic carcinoma within the chest, abdomen, or pelvis.   Left nephrolithiasis and renal parenchymal  atrophy. No evidence of ureteral calculi or hydronephrosis.   Aortic Atherosclerosis   05/31/2023 Imaging   CT chest abdomen pelvis with contrast showed 1. New hypodense 16 mm lesion in the right lobe of the liver with very subtle adjacent 5 mm lesion, suspicious for metastatic disease. 2. Small bilateral pulmonary nodules, some of which were subtly evident on prior examination only in retrospect, some of which demonstrate degree of cavitation, suspicious for metastatic disease. 3. Prominent retroperitoneal lymph nodes are similar prior. 4. Prior low anterior resection with Hartmann's pouch formation,similar small amount of perirectal/presacral soft tissue and fluid,favored postsurgical/posttreatment change. Suggest continued attention on follow-up imaging. 5. Large volume of formed stool in the colon. 6. Nonobstructive left renal calculi measure under 1 cm.     06/08/2023 Relapse/Recurrence   Ultrasound-guided liver biopsy showed Pathology showed metastatic carcinoma, morphology consistent with patient's clinical history of rectal adenocarcinoma.  [facility] NGS 648 gene panel showed TP53 missense variant, APC frameshift, TCF7L2 frameshift, FLT3 copy number gain, TMB 4.71m/MB MSI stable.  Wildtype KRAS/NRAS  [facility] RNA Seq - ERBB3 overexpressed, VEGFA overexpressed, NRAS overexpressed.     06/18/2023 Cancer Staging  Staging form: Colon and Rectum, AJCC 8th Edition - Pathologic stage from 06/18/2023: Stage IVA (rpTX, pN0, pM1a) - Signed by Babara Call, MD on 06/18/2023 Stage prefix: Recurrence Total positive nodes: 0   07/02/2023 - 06/04/2024 Chemotherapy   COLORECTAL FOLFIRI + Bevacizumab  q14d      11/13/2023 Imaging   CT chest abdomen pelvis w contrast showed  1. Increase in cavitation with decreased soft tissue involving the scattered bilateral pulmonary nodules, consistent with treatment response. No new suspicious pulmonary nodules or masses. 2. Decrease in size and conspicuity of the  hypodense lesions in the right lobe of the liver, consistent with treatment response. No new suspicious hepatic lesion identified. 3. Stable prominent nonspecific retroperitoneal lymph nodes. Suggest continued attention on follow-up imaging. 4. Similar surgical changes of prior low anterior resection with left anterior abdominal wall colostomy. Similar mesorectal/presacral soft tissue, no new suspicious enhancing nodularity. 5. Similar soft tissue nodularity in the bilateral posterior gluteal subcutaneous soft tissues. 6. Moderate volume of formed stool in the colon. Correlate for constipation.   03/03/2024 Imaging   CT chest abdomen pelvis w contrast showed 1. 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 2. No new or progressive disease. 3. Similar prominent but not pathologically sized abdominal retroperitoneal nodes, nonspecific. 4. Status post low anterior resection with descending colostomy. Possible constipation. 5. Age advanced coronary artery atherosclerosis. Recommend assessment of coronary risk factors. 6.  Aortic Atherosclerosis (ICD10-I70.0). 7. Left nephrolithiasis and renal scarring.    05/27/2024 Imaging   CT chest abdomen pelvis w contrast showed 1. Status post abdominoperineal resection with left lower quadrant end colostomy. 2. Unchanged enlarged retroperitoneal lymph nodes. 3. Unchanged small hypodense lesion of the posterior right lobe of the liver. 4. Very tiny, irregular cavitary nodules are unchanged. 5. New areas of irregular atelectasis or consolidation of the peripheral right lower lobe, nonspecific and most likely infectious or inflammatory. Metastatic disease distinctly not favored. Attention on follow-up. 6. Atrophic, scarred left kidney with nonobstructive calculi of the inferior pole. No hydronephrosis.   Aortic Atherosclerosis (ICD10-I70.0).     06/18/2024 -  Chemotherapy   5-FU and Bevacizumab  maintenance.    09/01/2024  Imaging   CT chest abdomen pelvis w contrast showed 1. Status post abdominoperineal resection with left lower quadrant end colostomy. No evidence of local recurrence. 2. Stable mildly enlarged/prominent retroperitoneal lymph nodes. 3. Stable tiny irregular cavitary pulmonary nodules. 4. Unchanged small hypodense lesion in the posterior right lobe of the liver. 5. No evidence of new or progressive disease in the chest, abdomen or pelvis. 6. Aortic atherosclerosis.   Aortic Atherosclerosis (ICD10-I70.0).     Patient has bipolar and schizophrenia..  Patient is married  She has housing issue due to previous history of eviction.  she does not live with her husband currently. She lives with some family members.   + chronic dysuria and urgency she was seen by Urology and was started on Gemteza.  Symptoms are improved.    INTERVAL HISTORY MARCEL SORTER is a 53 y.o. female who has above history reviewed by me today presents for follow up visit for rectal cancer.   Denies any melena or blood in the stool. Stable neuropathy symptoms. + loose BM, stable baseline.  +wheezing. No fever cough more than baseline. She continues to smoke Appetite is fair.  She has bilateral acute on chronic hip pain. She cancelled her orthopedic appointment. She plans to re schedule.   Review of Systems  Constitutional:  Positive for fatigue. Negative for  appetite change, chills and fever.  HENT:   Negative for hearing loss, mouth sores and voice change.   Eyes:  Negative for eye problems.  Respiratory:  Negative for chest tightness, cough and shortness of breath.   Cardiovascular:  Negative for chest pain.  Gastrointestinal:  Positive for diarrhea. Negative for abdominal distention, abdominal pain, blood in stool and nausea.       + colostomy   Endocrine: Negative for hot flashes.  Genitourinary:  Negative for difficulty urinating, dysuria and frequency.   Musculoskeletal:  Positive for arthralgias.   Skin:  Negative for itching and rash.  Neurological:  Positive for numbness. Negative for extremity weakness.  Hematological:  Negative for adenopathy.  Psychiatric/Behavioral:  Negative for confusion.     MEDICAL HISTORY:  Past Medical History:  Diagnosis Date   Allergy    pollen   Anxiety    Arthritis    right hip   Bipolar 1 disorder (HCC)    Cancer (HCC)    rectal   Chemotherapy-induced neuropathy 10/24/2022   Chronic kidney disease    COPD (chronic obstructive pulmonary disease) (HCC)    Depression    Family history of adverse reaction to anesthesia    grand father had a stroke during anesthesia   Family history of breast cancer    Family history of colon cancer    Family history of uterine cancer    GERD (gastroesophageal reflux disease)    History of kidney stones    Hyperlipidemia    Hypertension    Hypothyroidism    Panic attack    Pneumonia    Psoriasis    Sleep apnea 08/11/2021   No CPAP   Type 2 diabetes mellitus with microalbuminuria, without long-term current use of insulin  (HCC) 06/24/2019    SURGICAL HISTORY: Past Surgical History:  Procedure Laterality Date   BREAST BIOPSY Left 12/14/2021   us  bx, venus marker, path pending   CESAREAN SECTION     COLONOSCOPY WITH PROPOFOL  N/A 02/09/2022   Procedure: COLONOSCOPY WITH PROPOFOL ;  Surgeon: Unk Corinn Skiff, MD;  Location: [facility];  Service: Endoscopy;  Laterality: N/A;  sleep apnea   CYSTOSCOPY W/ RETROGRADES Left 11/08/2018   Procedure: CYSTOSCOPY WITH RETROGRADE PYELOGRAM;  Surgeon: Francisca Redell BROCKS, MD;  Location: [facility];  Service: Urology;  Laterality: Left;   CYSTOSCOPY/URETEROSCOPY/HOLMIUM LASER/STENT PLACEMENT Left 11/08/2018   Procedure: CYSTOSCOPY/URETEROSCOPY/HOLMIUM LASER/STENT PLACEMENT;  Surgeon: Francisca Redell BROCKS, MD;  Location: [facility];  Service: Urology;  Laterality: Left;   IR CV LINE INJECTION  06/25/2023   IR IMAGING GUIDED PORT INSERTION  05/11/2022   IR REMOVE CV  FIBRIN SHEATH  06/27/2023   MOUTH SURGERY     wisdom teeth extraction   MOUTH SURGERY     teeth removal   POLYPECTOMY N/A 02/09/2022   Procedure: POLYPECTOMY;  Surgeon: Unk Corinn Skiff, MD;  Location: [facility];  Service: Endoscopy;  Laterality: N/A;   XI ROBOTIC ASSISTED LOWER ANTERIOR RESECTION N/A 04/21/2022   Procedure: XI ROBOTIC ASSISTED LOWER ANTERIOR RESECTION WITH COLOSTOMY, BILATERAL TAP BLOCK, ASSESSMENT OF TISSUE PERFUSSION WITH FIREFLY INJECTION;  Surgeon: Teresa Lonni HERO, MD;  Location: [facility];  Service: General;  Laterality: N/A;    SOCIAL HISTORY: Social History   Socioeconomic History   Marital status: Married    Spouse name: Morene Jama Pereyra   Number of children: 1   Years of education: 12   Highest education level: High school graduate  Occupational History   Occupation: unemployed  Comment: disabled  Tobacco Use   Smoking status: Some Days    Current packs/day: 0.25    Average packs/day: 0.3 packs/day for 39.4 years (9.9 ttl pk-yrs)    Types: Cigarettes    Start date: 05/13/1985    Passive exposure: Current   Smokeless tobacco: Never   Tobacco comments:    5-6 cigarettes weekly- 11/07/2022  Vaping Use   Vaping status: Former   Start date: 05/25/2018   Quit date: 09/24/2018  Substance and Sexual Activity   Alcohol use: Not Currently    Alcohol/week: 4.0 standard drinks of alcohol    Types: 4 Glasses of wine per week    Comment: quit ETOH in Feb. 2023   Drug use: Yes    Types: Marijuana    Comment: 2 days ago   Sexual activity: Not Currently    Birth control/protection: Post-menopausal  Other Topics Concern   Not on file  Social History Narrative   Lives with husband and son    Social Drivers of Health   Financial Resource Strain: Medium Risk (09/04/2024)   Overall Financial Resource Strain (CARDIA)    Difficulty of Paying Living Expenses: Somewhat hard  Food Insecurity: Food Insecurity Present (10/15/2024)   Hunger Vital Sign     Worried About Running Out of Food in the Last Year: Sometimes true    Ran Out of Food in the Last Year: Sometimes true  Transportation Needs: No Transportation Needs (09/04/2024)   PRAPARE - Administrator, Civil Service (Medical): No    Lack of Transportation (Non-Medical): No  Physical Activity: Inactive (09/04/2024)   Exercise Vital Sign    Days of Exercise per Week: 0 days    Minutes of Exercise per Session: 0 min  Stress: Stress Concern Present (09/04/2024)   [employer] of Occupational Health - Occupational Stress Questionnaire    Feeling of Stress: To some extent  Social Connections: Moderately Integrated (09/04/2024)   Social Connection and Isolation Panel    Frequency of Communication with Friends and Family: More than three times a week    Frequency of Social Gatherings with Friends and Family: More than three times a week    Attends Religious Services: More than 4 times per year    Active Member of [employer] or Organizations: No    Attends Banker Meetings: Never    Marital Status: Married  Catering Manager Violence: Not At Risk (09/04/2024)   Humiliation, Afraid, Rape, and Kick questionnaire    Fear of Current or Ex-Partner: No    Emotionally Abused: No    Physically Abused: No    Sexually Abused: No    FAMILY HISTORY: Family History  Problem Relation Age of Onset   Depression Mother    Anxiety disorder Mother    Diabetes Mother    Hypertension Mother    Hyperlipidemia Mother    Cancer Mother    Uterine cancer Mother 75   Cervical cancer Mother 39   Colon cancer Father    Depression Brother    Anxiety disorder Brother    Cancer Maternal Aunt        unk types   Diabetes Mellitus II Maternal Grandmother    Hypercholesterolemia Maternal Grandmother    Breast cancer Maternal Grandmother    Cancer Paternal Grandmother    Diabetes Paternal Grandmother    Melanoma Paternal Grandmother    Stomach cancer Paternal Grandmother      ALLERGIES:  is allergic to metformin and related, nsaids, perphenazine, sulfa  antibiotics, abilify [aripiprazole], and penicillins.  MEDICATIONS:  Current Outpatient Medications  Medication Sig Dispense Refill   acetaminophen  (TYLENOL ) 325 MG tablet Take 650 mg by mouth every 6 (six) hours as needed for headache (pain).     albuterol  (VENTOLIN  HFA) 108 (90 Base) MCG/ACT inhaler Inhale 2 puffs into the lungs every 6 (six) hours as needed for wheezing or shortness of breath. 1 each 5   amantadine  (SYMMETREL ) 100 MG capsule Take 100 mg by mouth 2 (two) times daily.     blood glucose meter kit and supplies Dispense based on patient and insurance preference. Use up to four times daily as directed. (FOR ICD-10 E10.9, E11.9). 1 each 0   Budeson-Glycopyrrol-Formoterol  (BREZTRI  AEROSPHERE) 160-9-4.8 MCG/ACT AERO Inhale 2 puffs into the lungs in the morning and at bedtime. 10.7 g 2   gabapentin  (NEURONTIN ) 300 MG capsule Take 300 mg by mouth 2 (two) times daily.     loperamide  (IMODIUM ) 2 MG capsule Take 1 capsule (2 mg total) by mouth See admin instructions. Initial: 4 mg,the 2 mg every 2 hours (4 mg every 4 hours at night)  maximum: 16 mg/day 120 capsule 2   loratadine  (CLARITIN ) 10 MG tablet Take 10 mg by mouth every morning.     ondansetron  (ZOFRAN ) 8 MG tablet Take 1 tablet (8 mg total) by mouth every 8 (eight) hours as needed for nausea, vomiting or refractory nausea / vomiting. Start on the third day after chemotherapy. 30 tablet 1   potassium chloride  SA (KLOR-CON  M) 20 MEQ tablet Take 1 tablet (20 mEq total) by mouth daily. 30 tablet 0   promethazine  (PHENERGAN ) 12.5 MG tablet Take 1 tablet (12.5 mg total) by mouth every 6 (six) hours as needed for nausea or vomiting. 30 tablet 0   Vibegron  (GEMTESA ) 75 MG TABS Take 1 tablet (75 mg total) by mouth daily.     buPROPion  (WELLBUTRIN  XL) 300 MG 24 hr tablet Take 300 mg by mouth daily. (Patient not taking: Reported on 10/22/2024)      Cholecalciferol  (VITAMIN D -3) 125 MCG (5000 UT) TABS Take 5,000 Units by mouth daily. (Patient not taking: Reported on 10/22/2024) 30 tablet 1   clonazePAM (KLONOPIN) 0.5 MG tablet Take 1 mg by mouth 2 (two) times daily as needed. (Patient not taking: Reported on 10/22/2024)     diltiazem  (CARDIZEM  CD) 120 MG 24 hr capsule TAKE 1 CAPSULE BY MOUTH DAILY (Patient not taking: Reported on 10/22/2024) 90 capsule 1   docusate sodium  (COLACE) 100 MG capsule TAKE 1 CAPSULE BY MOUTH TWICE DAILY (Patient not taking: Reported on 10/22/2024) 60 capsule 2   FARXIGA  10 MG TABS tablet TAKE 1 TABLET BY MOUTH DAILY BEFORE BREAKFAST (Patient not taking: Reported on 10/22/2024) 90 tablet 0   icosapent  Ethyl (VASCEPA ) 1 g capsule Take 2 capsules (2 g total) by mouth in the morning and at bedtime. (Patient not taking: Reported on 10/22/2024) 120 capsule 2   levothyroxine  (SYNTHROID ) 75 MCG tablet Take 1 tablet (75 mcg total) by mouth daily before breakfast. 90 tablet 2   lidocaine -prilocaine  (EMLA ) cream Apply to affected area once (Patient not taking: Reported on 10/22/2024) 30 g 3   magic mouthwash w/lidocaine  SOLN Take 5 mLs by mouth 4 (four) times daily as needed for mouth pain. Sig: Swish/Swallow 5-10 ml four times a day as needed. Dispense 480 ml. 1RF (Patient not taking: Reported on 10/22/2024) 480 mL 1   paliperidone  (INVEGA  SUSTENNA) 234 MG/1.5ML SUSY injection Inject 234 mg into the muscle  every 30 (thirty) days. On or about the 14th of each month (Patient not taking: Reported on 10/22/2024)     pantoprazole  (PROTONIX ) 40 MG tablet TAKE 1 TABLET BY MOUTH DAILY (Patient not taking: Reported on 10/22/2024) 90 tablet 2   pioglitazone  (ACTOS ) 15 MG tablet Take 1 tablet (15 mg total) by mouth daily. (Patient not taking: Reported on 10/22/2024) 90 tablet 1   predniSONE  (STERAPRED UNI-PAK 21 TAB) 10 MG (21) TBPK tablet Day 1 take 6 tablets, Day 2 take 5 tablets, Day 3 take 4 tablets, Day 4 take 3 tablets, Day 5 take 2  tablets D6 take 1 tablet (Patient not taking: Reported on 10/22/2024) 1 each 0   rosuvastatin  (CRESTOR ) 40 MG tablet TAKE 1 TABLET BY MOUTH EVERY MORNING (Patient not taking: Reported on 10/22/2024) 90 tablet 0   sertraline  (ZOLOFT ) 100 MG tablet Take 200 mg by mouth every morning. (Patient not taking: Reported on 10/22/2024)     Spacer/Aero-Holding Chambers (AEROCHAMBER MV) inhaler Use as instructed (Patient not taking: Reported on 10/22/2024) 1 each 0   No current facility-administered medications for this visit.   Facility-Administered Medications Ordered in Other Visits  Medication Dose Route Frequency Provider Last Rate Last Admin   acetaminophen  (TYLENOL ) tablet 650 mg  650 mg Oral PRN Babara Call, MD       albuterol  (PROVENTIL ) (2.5 MG/3ML) 0.083% nebulizer solution 2.5 mg  2.5 mg Nebulization Once Dgayli, Khabib, MD       fluorouracil  (ADRUCIL ) 3,500 mg in sodium chloride  0.9 % 80 mL chemo infusion  2,400 mg/m2 (Order-Specific) Intravenous 1 day or 1 dose Babara Call, MD       fluorouracil  (ADRUCIL ) chemo injection 600 mg  400 mg/m2 (Order-Specific) Intravenous Once Babara Call, MD       leucovorin  596 mg in sodium chloride  0.9 % 250 mL infusion  400 mg/m2 (Order-Specific) Intravenous Once Babara Call, MD 560 mL/hr at 10/22/24 1023 596 mg at 10/22/24 1023   ondansetron  (ZOFRAN ) injection 8 mg  8 mg Intravenous PRN Wadie Mattie, MD   8 mg at 10/22/24 9075   sodium chloride  flush (NS) 0.9 % injection 10 mL  10 mL Intracatheter PRN Babara Call, MD   10 mL at 08/15/23 1327   sodium chloride  flush (NS) 0.9 % injection 10 mL  10 mL Intracatheter PRN Babara Call, MD   10 mL at 03/26/24 1255     PHYSICAL EXAMINATION: ECOG PERFORMANCE STATUS: 0 - Asymptomatic Vitals:   10/22/24 0837 10/22/24 0841  BP: (!) 143/86 (!) 148/86  Pulse: 83   Resp: 16   Temp: (!) 97 F (36.1 C)   SpO2: 95%      Filed Weights   10/22/24 0837  Weight: 117 lb (53.1 kg)      Physical Exam Constitutional:      General: She is  not in acute distress. HENT:     Head: Normocephalic and atraumatic.  Eyes:     General: No scleral icterus. Cardiovascular:     Rate and Rhythm: Normal rate and regular rhythm.  Pulmonary:     Effort: Pulmonary effort is normal. No respiratory distress.     Breath sounds: No wheezing.     Comments: Decreased breath sound bilaterally Abdominal:     General: Bowel sounds are normal. There is no distension.     Palpations: Abdomen is soft.     Comments: +colostomy   Musculoskeletal:        General: No deformity. Normal range of motion.  Cervical back: Normal range of motion and neck supple.  Skin:    General: Skin is warm and dry.     Findings: No erythema or rash.  Neurological:     Mental Status: She is alert and oriented to person, place, and time. Mental status is at baseline.     Cranial Nerves: No cranial nerve deficit.     Coordination: Coordination normal.  Psychiatric:        Mood and Affect: Mood normal.     LABORATORY DATA:  I have reviewed the data as listed    Latest Ref Rng & Units 10/22/2024    8:21 AM 10/08/2024    8:44 AM 09/24/2024    8:27 AM  CBC  WBC 4.0 - 10.5 K/uL 7.0  6.2  9.7   Hemoglobin 12.0 - 15.0 g/dL 86.7  86.3  85.6   Hematocrit 36.0 - 46.0 % 39.7  40.4  41.3   Platelets 150 - 400 K/uL 130  142  115       Latest Ref Rng & Units 10/22/2024    8:21 AM 10/08/2024    8:44 AM 09/24/2024    8:27 AM  CMP  Glucose 70 - 99 mg/dL 867  887  839   BUN 6 - 20 mg/dL 11  19  10    Creatinine 0.44 - 1.00 mg/dL 9.05  9.01  8.81   Sodium 135 - 145 mmol/L 137  136  135   Potassium 3.5 - 5.1 mmol/L 4.2  4.4  3.7   Chloride 98 - 111 mmol/L 105  104  105   CO2 22 - 32 mmol/L 22  25  22    Calcium  8.9 - 10.3 mg/dL 8.7  9.0  8.8   Total Protein 6.5 - 8.1 g/dL 6.3  6.5  6.6   Total Bilirubin 0.0 - 1.2 mg/dL 0.5  0.4  0.7   Alkaline Phos 38 - 126 U/L 79  73  79   AST 15 - 41 U/L 27  22  27    ALT 0 - 44 U/L 19  12  12          RADIOGRAPHIC STUDIES: I  have personally reviewed the radiological images as listed and agreed with the findings in the report. MR HIP RIGHT W WO CONTRAST Result Date: 10/15/2024 MR HIP WITHOUT THEN WITH IV CONTRAST RIGHT COMPARISON: None. CLINICAL HISTORY: Acute right hip pain. Associated sciatica. PULSE SEQUENCES: AX T1, Ax T2 FS, Cor T1, COR STIR & SMALL FOV COR PD FS with and without IV contrast. FINDINGS: Bones and labrum: Mild degenerative changes are present with slight chondromalacia and mild osteophyte formation. There is no accelerated arthrosis. No subchondral reactive edema or significant joint effusion is present. There is no inflammatory enhancement identified. There is no fracture or contusion pattern. There is suggestion of a tear of the anterior labrum best seen on sagittal image 19 with a developing para labral cyst. Correlation for anterior labral symptoms. The pelvic ring, sacrum and SI joints are unremarkable. There is mild degenerative spondylosis with slight levoscoliosis of the lower lumbar spine. There are several benign renal cysts, right greater than left. There is atrophy of the left kidney which is incompletely evaluated. See prior CT report for more detail. Benign injection granulomas are present in the gluteal regions. Ostomy is seen in the left lower quadrant. Musculotendinous structures: There is mild insertional tendinosis of the gluteus minimus tendon. Otherwise, no significant tendinosis or myositis. No bursal collection is present. IMPRESSION:  Mild degenerative changes in the right hip without accelerated arthrosis. Likely degenerative tear of the anterior labrum with a developing para labral cyst as above. Correlation for anterior labral symptoms. Otherwise, unremarkable MRI of the right hip. No suspicious enhancement following contrast administration. Electronically signed by: Norleen Satchel MD 10/15/2024 04:08 PM EDT RP Workstation: [id number]

## 2024-10-22 NOTE — Progress Notes (Signed)
 [facility] CSW Progress Note  Clinical Child Psychotherapist contacted patient by phone to follow-up on need for community resources.  She informed April Luna that she needed Depends.    Interventions: Referred patient to community resources: [facility].  Patient stated she will be receiving her Medicaid shipment of depends in the next few days and did not require a referral.       Follow Up Plan:  Patient will contact CSW with any support or resource needs    [name], LCSW Clinical Social Worker [facility]    Patient is participating in a Managed Medicaid Plan:  Yes

## 2024-10-22 NOTE — Assessment & Plan Note (Signed)
 Grade 2  Continue gabapentin 300mg  BID.

## 2024-10-22 NOTE — Patient Instructions (Signed)
 [facility] - A DEPT OF [facility]  Discharge Instructions: Thank you for choosing [facility] to provide your oncology and hematology care.  If you have a lab appointment with the Cancer Center, please go directly to the Cancer Center and check in at the registration area.  Wear comfortable clothing and clothing appropriate for easy access to any Portacath or PICC line.   We strive to give you quality time with your provider. You may need to reschedule your appointment if you arrive late (15 or more minutes).  Arriving late affects you and other patients whose appointments are after yours.  Also, if you miss three or more appointments without notifying the office, you may be dismissed from the clinic at the provider's discretion.      For prescription refill requests, have your pharmacy contact our office and allow 72 hours for refills to be completed.    Today you received the following chemotherapy and/or immunotherapy agents Mvasi , Leucovorin  and Adrucil       To help prevent nausea and vomiting after your treatment, we encourage you to take your nausea medication as directed.  BELOW ARE SYMPTOMS THAT SHOULD BE REPORTED IMMEDIATELY: *FEVER GREATER THAN 100.4 F (38 C) OR HIGHER *CHILLS OR SWEATING *NAUSEA AND VOMITING THAT IS NOT CONTROLLED WITH YOUR NAUSEA MEDICATION *UNUSUAL SHORTNESS OF BREATH *UNUSUAL BRUISING OR BLEEDING *URINARY PROBLEMS (pain or burning when urinating, or frequent urination) *BOWEL PROBLEMS (unusual diarrhea, constipation, pain near the anus) TENDERNESS IN MOUTH AND THROAT WITH OR WITHOUT PRESENCE OF ULCERS (sore throat, sores in mouth, or a toothache) UNUSUAL RASH, SWELLING OR PAIN  UNUSUAL VAGINAL DISCHARGE OR ITCHING   Items with * indicate a potential emergency and should be followed up as soon as possible or go to the Emergency Department if any problems should occur.  Please show the CHEMOTHERAPY ALERT CARD  or IMMUNOTHERAPY ALERT CARD at check-in to the Emergency Department and triage nurse.  Should you have questions after your visit or need to cancel or reschedule your appointment, please contact [facility] - A DEPT OF [facility]  [phone] and follow the prompts.  Office hours are 8:00 a.m. to 4:30 p.m. Monday - Friday. Please note that voicemails left after 4:00 p.m. may not be returned until the following business day.  We are closed weekends and major holidays. You have access to a nurse at all times for urgent questions. Please call the main number to the clinic [phone] and follow the prompts.  For any non-urgent questions, you may also contact your provider using MyChart. We now offer e-Visits for anyone 42 and older to request care online for non-urgent symptoms. For details visit [email or web address].   Also download the MyChart app! Go to the app store, search "MyChart", open the app, select [facility], and log in with your MyChart username and password.

## 2024-10-22 NOTE — Assessment & Plan Note (Signed)
 Chemotherapy plan as listed above

## 2024-10-22 NOTE — Assessment & Plan Note (Signed)
 Acute on chronic.  Associated sciatic pain.  I have referred to orthopedics in June and she has not established care yet right hip MRI showed DJD

## 2024-10-24 ENCOUNTER — Encounter: Payer: Self-pay | Admitting: Nurse Practitioner

## 2024-10-24 ENCOUNTER — Encounter: Payer: Self-pay | Admitting: Oncology

## 2024-10-24 ENCOUNTER — Telehealth: Payer: Self-pay | Admitting: Pharmacy Technician

## 2024-10-24 ENCOUNTER — Inpatient Hospital Stay

## 2024-10-24 NOTE — Telephone Encounter (Signed)
 Received referral from staff indicating patient is struggling with obtaining diapers.  Spoke with patient.  Patient stated that she will have Medicaid effective 11/03/24 and the Medicaid will cover the cost of her diapers.  Provided patient with contact information for [facility] to contact regarding receiving diapers.  Made patient aware that [facility] distributes diapers the 3rd week of every month from 9am till 2pm.  It is first come first serve.  Told patient that it is good to try to go on Monday or Tuesday of the 3rd week because the diaper supply is usually gone toward the end of the week.  Patient stated that she will keep them in mind if she has a need going forward.  Provided patient with the phone number [phone] to contact to find out the contact information of local churches and food banks where she could go to obtain food.  April Luna Patient Pharmacologist [facility]

## 2024-11-04 ENCOUNTER — Other Ambulatory Visit: Payer: Self-pay

## 2024-11-04 DIAGNOSIS — C20 Malignant neoplasm of rectum: Secondary | ICD-10-CM

## 2024-11-05 ENCOUNTER — Inpatient Hospital Stay: Attending: Oncology

## 2024-11-05 ENCOUNTER — Inpatient Hospital Stay

## 2024-11-05 ENCOUNTER — Inpatient Hospital Stay (HOSPITAL_BASED_OUTPATIENT_CLINIC_OR_DEPARTMENT_OTHER): Admitting: Oncology

## 2024-11-05 ENCOUNTER — Encounter: Payer: Self-pay | Admitting: Oncology

## 2024-11-05 VITALS — BP 129/76 | HR 79 | Temp 97.1°F | Resp 18 | Wt 118.4 lb

## 2024-11-05 DIAGNOSIS — Z5111 Encounter for antineoplastic chemotherapy: Secondary | ICD-10-CM

## 2024-11-05 DIAGNOSIS — D122 Benign neoplasm of ascending colon: Secondary | ICD-10-CM | POA: Diagnosis not present

## 2024-11-05 DIAGNOSIS — J441 Chronic obstructive pulmonary disease with (acute) exacerbation: Secondary | ICD-10-CM | POA: Insufficient documentation

## 2024-11-05 DIAGNOSIS — I129 Hypertensive chronic kidney disease with stage 1 through stage 4 chronic kidney disease, or unspecified chronic kidney disease: Secondary | ICD-10-CM | POA: Insufficient documentation

## 2024-11-05 DIAGNOSIS — C20 Malignant neoplasm of rectum: Secondary | ICD-10-CM | POA: Diagnosis not present

## 2024-11-05 DIAGNOSIS — Z720 Tobacco use: Secondary | ICD-10-CM

## 2024-11-05 DIAGNOSIS — Z923 Personal history of irradiation: Secondary | ICD-10-CM | POA: Insufficient documentation

## 2024-11-05 DIAGNOSIS — E039 Hypothyroidism, unspecified: Secondary | ICD-10-CM | POA: Diagnosis not present

## 2024-11-05 DIAGNOSIS — Z79899 Other long term (current) drug therapy: Secondary | ICD-10-CM | POA: Insufficient documentation

## 2024-11-05 DIAGNOSIS — R1310 Dysphagia, unspecified: Secondary | ICD-10-CM | POA: Insufficient documentation

## 2024-11-05 DIAGNOSIS — G620 Drug-induced polyneuropathy: Secondary | ICD-10-CM

## 2024-11-05 DIAGNOSIS — G4730 Sleep apnea, unspecified: Secondary | ICD-10-CM | POA: Insufficient documentation

## 2024-11-05 DIAGNOSIS — K219 Gastro-esophageal reflux disease without esophagitis: Secondary | ICD-10-CM | POA: Insufficient documentation

## 2024-11-05 DIAGNOSIS — M25551 Pain in right hip: Secondary | ICD-10-CM | POA: Diagnosis not present

## 2024-11-05 DIAGNOSIS — F17210 Nicotine dependence, cigarettes, uncomplicated: Secondary | ICD-10-CM | POA: Diagnosis not present

## 2024-11-05 DIAGNOSIS — F319 Bipolar disorder, unspecified: Secondary | ICD-10-CM | POA: Insufficient documentation

## 2024-11-05 DIAGNOSIS — Z5189 Encounter for other specified aftercare: Secondary | ICD-10-CM | POA: Insufficient documentation

## 2024-11-05 DIAGNOSIS — E1140 Type 2 diabetes mellitus with diabetic neuropathy, unspecified: Secondary | ICD-10-CM | POA: Diagnosis not present

## 2024-11-05 DIAGNOSIS — E785 Hyperlipidemia, unspecified: Secondary | ICD-10-CM | POA: Diagnosis not present

## 2024-11-05 DIAGNOSIS — F209 Schizophrenia, unspecified: Secondary | ICD-10-CM | POA: Diagnosis not present

## 2024-11-05 DIAGNOSIS — I700 Atherosclerosis of aorta: Secondary | ICD-10-CM | POA: Diagnosis not present

## 2024-11-05 DIAGNOSIS — T451X5A Adverse effect of antineoplastic and immunosuppressive drugs, initial encounter: Secondary | ICD-10-CM | POA: Diagnosis not present

## 2024-11-05 DIAGNOSIS — E1122 Type 2 diabetes mellitus with diabetic chronic kidney disease: Secondary | ICD-10-CM | POA: Diagnosis not present

## 2024-11-05 DIAGNOSIS — J9601 Acute respiratory failure with hypoxia: Secondary | ICD-10-CM | POA: Insufficient documentation

## 2024-11-05 DIAGNOSIS — Z9221 Personal history of antineoplastic chemotherapy: Secondary | ICD-10-CM | POA: Diagnosis not present

## 2024-11-05 DIAGNOSIS — Z803 Family history of malignant neoplasm of breast: Secondary | ICD-10-CM | POA: Insufficient documentation

## 2024-11-05 DIAGNOSIS — Z800 Family history of malignant neoplasm of digestive organs: Secondary | ICD-10-CM | POA: Insufficient documentation

## 2024-11-05 DIAGNOSIS — I2510 Atherosclerotic heart disease of native coronary artery without angina pectoris: Secondary | ICD-10-CM | POA: Diagnosis not present

## 2024-11-05 DIAGNOSIS — M1611 Unilateral primary osteoarthritis, right hip: Secondary | ICD-10-CM | POA: Diagnosis not present

## 2024-11-05 DIAGNOSIS — N200 Calculus of kidney: Secondary | ICD-10-CM | POA: Diagnosis not present

## 2024-11-05 LAB — CBC WITH DIFFERENTIAL (CANCER CENTER ONLY)
Abs Immature Granulocytes: 0.01 K/uL (ref 0.00–0.07)
Basophils Absolute: 0 K/uL (ref 0.0–0.1)
Basophils Relative: 1 %
Eosinophils Absolute: 0.1 K/uL (ref 0.0–0.5)
Eosinophils Relative: 3 %
HCT: 36.7 % (ref 36.0–46.0)
Hemoglobin: 12.1 g/dL (ref 12.0–15.0)
Immature Granulocytes: 0 %
Lymphocytes Relative: 10 %
Lymphs Abs: 0.5 K/uL — ABNORMAL LOW (ref 0.7–4.0)
MCH: 33.7 pg (ref 26.0–34.0)
MCHC: 33 g/dL (ref 30.0–36.0)
MCV: 102.2 fL — ABNORMAL HIGH (ref 80.0–100.0)
Monocytes Absolute: 0.5 K/uL (ref 0.1–1.0)
Monocytes Relative: 12 %
Neutro Abs: 3.5 K/uL (ref 1.7–7.7)
Neutrophils Relative %: 74 %
Platelet Count: 163 K/uL (ref 150–400)
RBC: 3.59 MIL/uL — ABNORMAL LOW (ref 3.87–5.11)
RDW: 16 % — ABNORMAL HIGH (ref 11.5–15.5)
WBC Count: 4.6 K/uL (ref 4.0–10.5)
nRBC: 0 % (ref 0.0–0.2)

## 2024-11-05 LAB — CMP (CANCER CENTER ONLY)
ALT: 16 U/L (ref 0–44)
AST: 22 U/L (ref 15–41)
Albumin: 3.2 g/dL — ABNORMAL LOW (ref 3.5–5.0)
Alkaline Phosphatase: 75 U/L (ref 38–126)
Anion gap: 8 (ref 5–15)
BUN: 18 mg/dL (ref 6–20)
CO2: 26 mmol/L (ref 22–32)
Calcium: 8.7 mg/dL — ABNORMAL LOW (ref 8.9–10.3)
Chloride: 104 mmol/L (ref 98–111)
Creatinine: 0.88 mg/dL (ref 0.44–1.00)
GFR, Estimated: >60 mL/min (ref >60)
Glucose, Bld: 118 mg/dL — ABNORMAL HIGH (ref 70–99)
Potassium: 3.9 mmol/L (ref 3.5–5.1)
Sodium: 138 mmol/L (ref 135–145)
Total Bilirubin: 0.4 mg/dL (ref 0.0–1.2)
Total Protein: 6 g/dL — ABNORMAL LOW (ref 6.5–8.1)

## 2024-11-05 LAB — GENETIC SCREENING ORDER

## 2024-11-05 LAB — TOTAL PROTEIN, URINE DIPSTICK: Protein, ur: NEGATIVE mg/dL

## 2024-11-05 MED ORDER — TRAMADOL HCL 50 MG PO TABS: 50.0000 mg | ORAL_TABLET | Freq: Three times a day (TID) | ORAL | 0 refills | Status: AC | PRN

## 2024-11-05 MED ORDER — SODIUM CHLORIDE 0.9 % IV SOLN
400.0000 mg/m2 | Freq: Once | INTRAVENOUS | Status: AC
  Administered 2024-11-05: 596 mg via INTRAVENOUS
  Filled 2024-11-05: qty 29.8

## 2024-11-05 MED ORDER — SODIUM CHLORIDE 0.9 % IV SOLN
Freq: Once | INTRAVENOUS | Status: AC
  Filled 2024-11-05: qty 250

## 2024-11-05 MED ORDER — SODIUM CHLORIDE 0.9 % IV SOLN
2400.0000 mg/m2 | INTRAVENOUS | Status: DC
  Administered 2024-11-05: 3500 mg via INTRAVENOUS
  Filled 2024-11-05: qty 70

## 2024-11-05 MED ORDER — FLUOROURACIL CHEMO INJECTION 2.5 GM/50ML
400.0000 mg/m2 | Freq: Once | INTRAVENOUS | Status: AC
  Administered 2024-11-05: 600 mg via INTRAVENOUS
  Filled 2024-11-05: qty 12

## 2024-11-05 MED ORDER — ACETAMINOPHEN 325 MG PO TABS: 650.0000 mg | ORAL_TABLET | ORAL | Status: DC | PRN

## 2024-11-05 MED ORDER — SODIUM CHLORIDE 0.9 % IV SOLN
5.0000 mg/kg | Freq: Once | INTRAVENOUS | Status: AC
  Administered 2024-11-05: 275 mg via INTRAVENOUS
  Filled 2024-11-05: qty 11

## 2024-11-05 MED ORDER — DEXAMETHASONE SOD PHOSPHATE PF 10 MG/ML IJ SOLN
10.0000 mg | Freq: Once | INTRAMUSCULAR | Status: AC
  Administered 2024-11-05: 10 mg via INTRAVENOUS

## 2024-11-05 MED ORDER — ONDANSETRON HCL 4 MG/2ML IJ SOLN
8.0000 mg | INTRAMUSCULAR | Status: DC | PRN
  Administered 2024-11-05: 8 mg via INTRAVENOUS
  Filled 2024-11-05 (×2): qty 4

## 2024-11-05 MED ORDER — NICOTINE 14 MG/24HR TD PT24: 14.0000 mg | MEDICATED_PATCH | Freq: Every day | TRANSDERMAL | 1 refills | Status: AC

## 2024-11-05 NOTE — Assessment & Plan Note (Signed)
 Grade 2  Continue gabapentin 300mg  BID.

## 2024-11-05 NOTE — Progress Notes (Signed)
 Hematology/Oncology Progress note [phone] [phone]      CHIEF COMPLAINTS/REASON FOR VISIT:  Follow-up for Stage IV  rectal cancer treatments.  ASSESSMENT & PLAN:   Cancer Staging  Rectal cancer [facility] [place]) Staging form: Colon and Rectum, AJCC 8th Edition - Pathologic stage from 05/03/2022: Stage IIIB (pT3, pN1b, cM0) - Signed by Babara Call, MD on 05/03/2022 - Pathologic stage from 06/18/2023: Stage IVA (rpTX, rpN0, rpM1a) - Signed by Babara Call, MD on 06/18/2023   Rectal cancer [facility]) History of Stage III. pT3 pN1b cM0, s/p APR. 05/2023 Stage IV current rectal adenocarcinoma Currently on adjuvant chemotherapy. S/p FOLFOX x 4 cycles S/p concurrent chemotherapy [Xeloda  1300 mg BID] with RT, and then another 4 cycles of adjuvant FOLFOX [dose reduced oxaliplatin  and omit 5-FU bolus 05/2023 CT imaging indicates disease progression. liver biopsy pathology showed metastatic carcinoma--> 06/2023 FOLFIRI + Bevacizumab --> 10/2023 and 02/2024 CT partial response --> 05/2024 CT stable - Shared decision was made to hold off irinotecan  given stable disease--> 08/2024 CT stable.  NGS showed TP53 missense variant, APC frameshift, TCF7L2 Frameshift, FLT3 copy number gain., TMB 4.7, pMMR Wildtype KRAS/NRAS  Labs are reviewed and discussed with patient. Recent Signatera circulating tumor DNA is positive.  CT showed stable disease.  Proceed with 5-Fu + Bevacizumab      Chemotherapy-induced neuropathy Grade 2  Continue gabapentin  300mg  BID.   Encounter for antineoplastic chemotherapy Chemotherapy plan as listed above  Hip pain Acute on chronic.    I have referred to orthopedics in June and she has not established care yet right hip MRI showed DJD  She requested pain medication as pain is not managed with Tylenol .  Recommend Tramadol  50mg  Q8h PRN.   Tobacco use Recommend smoke cessation. .  Nicotin patch Rx was sent    No orders of the defined types were placed in this  encounter.   Follow-up 3 week(s)  All questions were answered. The patient knows to call the clinic with any problems, questions or concerns.  Call Babara, MD, PhD [facility] Hematology Oncology 11/05/2024     HISTORY OF PRESENTING ILLNESS:   April Luna is a  53 y.o.  female presents for treatment of rectal cancer Oncology History  Rectal cancer (HCC)  02/26/2022 Imaging   MRI PELVIS WITHOUT CONTRAST- By imaging, rectal cancer stage:  T1/T2, N0, Mx    03/02/2022 Imaging   CT CHEST AND ABDOMEN WITH CONTRAST 1. No convincing evidence of metastatic disease within the chest or abdomen. 2. Atrophic left kidney with multifocal renal scarring and cortical calcifications as well as nonobstructive renal stones measuring up to 5 mm. 3. Prominent left-sided predominant retroperitoneal lymph nodes measuring up to 8 mm near the level of the renal hilum, overall decreased in size dating back to CT September 19, 2018 and favored reactive related to left renal inflammation. 4.  Aortic Atherosclerosis (ICD10-I70.0).   03/27/2022 Genetic Testing    [employer] CustomNext+RNA cancer panel found no pathogenic mutations.    04/21/2022 Initial Diagnosis   Rectal cancer - baseline CEA 3.6 -02/09/2022, patient had colonoscopy which showed renal mass 10 cm from anal verge.  5 mm polyp in ascending colon.  Removed and retrieved. Pathology showed rectal adenocarcinoma.  The polyp in the ascending colon is a tubular adenoma.  MRI showed cT1/T2N0 disease  -04/21/2022, patient underwent robotic assisted ultralow anterior resection. Pathology showed moderately differentiated adenocarcinoma, 4.5 cm in maximal extent, with focal extension through muscularis propria into perirectal soft tissue.  3 lymph nodes positive  for metastatic carcinoma.  Negative margin.  pT3 pN1b, MSI stable.   05/03/2022 Cancer Staging   Staging form: Colon and Rectum, AJCC 8th Edition - Pathologic stage from 05/03/2022: Stage IIIB (pT3,  pN1b, cM0) - Signed by Babara Call, MD on 05/03/2022 Stage prefix: Initial diagnosis   05/11/2022 Miscellaneous   Medi port placed by Dr.White   05/26/2022 -  Chemotherapy   FOLFOX Q2 weeks x 4   05/26/2022 - 07/09/2022 Chemotherapy   Patient is on Treatment Plan : COLORECTAL FOLFOX q14d x 8 cycles     05/26/2022 - 12/13/2022 Chemotherapy   Patient is on Treatment Plan : COLORECTAL FOLFOX q14d x 4 months     07/27/2022 -  Chemotherapy   Concurrent chemotherapy- Xeloda   1300mg  BID and radiation.    07/27/2022 - 09/12/2022 Radiation Therapy    concurrent chemotherapy [Xeloda  1300 mg BID] with RT    09/20/2022 Imaging   CT Angiogram chest PET protocol There is no evidence of pulmonary artery embolism. There is no evidence of thoracic aortic dissection.   Small linear patchy alveolar infiltrate is seen in medial segment of right middle lobe suggesting atelectasis/pneumonia.     09/28/2022 - 09/30/2022 Hospital Admission   Admission due to acute respiratory failure with hypoxia, COPD exacerbation   09/28/2022 Imaging   CT angio chest PE protocol 1. Negative for acute PE or thoracic aortic dissection. 2. New ground-glass infiltrates anteriorly in bilateral upper lobes,may represent atypical edema, infectious or inflammatory process. 3.  Aortic Atherosclerosis     12/22/2022 Imaging   CT chest abdomen pelvis w contrast  Stable exam. No evidence of recurrent or metastatic carcinoma within the chest, abdomen, or pelvis.   Left nephrolithiasis and renal parenchymal atrophy. No evidence of ureteral calculi or hydronephrosis.   Aortic Atherosclerosis   05/31/2023 Imaging   CT chest abdomen pelvis with contrast showed 1. New hypodense 16 mm lesion in the right lobe of the liver with very subtle adjacent 5 mm lesion, suspicious for metastatic disease. 2. Small bilateral pulmonary nodules, some of which were subtly evident on prior examination only in retrospect, some of which demonstrate degree of  cavitation, suspicious for metastatic disease. 3. Prominent retroperitoneal lymph nodes are similar prior. 4. Prior low anterior resection with Hartmann's pouch formation,similar small amount of perirectal/presacral soft tissue and fluid,favored postsurgical/posttreatment change. Suggest continued attention on follow-up imaging. 5. Large volume of formed stool in the colon. 6. Nonobstructive left renal calculi measure under 1 cm.     06/08/2023 Relapse/Recurrence   Ultrasound-guided liver biopsy showed Pathology showed metastatic carcinoma, morphology consistent with patient's clinical history of rectal adenocarcinoma.  [employer] NGS 648 gene panel showed TP53 missense variant, APC frameshift, TCF7L2 frameshift, FLT3 copy number gain, TMB 4.39m/MB MSI stable.  Wildtype KRAS/NRAS  [employer] RNA Seq - ERBB3 overexpressed, VEGFA overexpressed, NRAS overexpressed.     06/18/2023 Cancer Staging   Staging form: Colon and Rectum, AJCC 8th Edition - Pathologic stage from 06/18/2023: Stage IVA (rpTX, pN0, pM1a) - Signed by Babara Call, MD on 06/18/2023 Stage prefix: Recurrence Total positive nodes: 0   07/02/2023 - 06/04/2024 Chemotherapy   COLORECTAL FOLFIRI + Bevacizumab  q14d      11/13/2023 Imaging   CT chest abdomen pelvis w contrast showed  1. Increase in cavitation with decreased soft tissue involving the scattered bilateral pulmonary nodules, consistent with treatment response. No new suspicious pulmonary nodules or masses. 2. Decrease in size and conspicuity of the hypodense lesions in the right lobe of the liver,  consistent with treatment response. No new suspicious hepatic lesion identified. 3. Stable prominent nonspecific retroperitoneal lymph nodes. Suggest continued attention on follow-up imaging. 4. Similar surgical changes of prior low anterior resection with left anterior abdominal wall colostomy. Similar mesorectal/presacral soft tissue, no new suspicious enhancing nodularity. 5.  Similar soft tissue nodularity in the bilateral posterior gluteal subcutaneous soft tissues. 6. Moderate volume of formed stool in the colon. Correlate for constipation.   03/03/2024 Imaging   CT chest abdomen pelvis w contrast showed 1. 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 2. No new or progressive disease. 3. Similar prominent but not pathologically sized abdominal retroperitoneal nodes, nonspecific. 4. Status post low anterior resection with descending colostomy. Possible constipation. 5. Age advanced coronary artery atherosclerosis. Recommend assessment of coronary risk factors. 6.  Aortic Atherosclerosis (ICD10-I70.0). 7. Left nephrolithiasis and renal scarring.    05/27/2024 Imaging   CT chest abdomen pelvis w contrast showed 1. Status post abdominoperineal resection with left lower quadrant end colostomy. 2. Unchanged enlarged retroperitoneal lymph nodes. 3. Unchanged small hypodense lesion of the posterior right lobe of the liver. 4. Very tiny, irregular cavitary nodules are unchanged. 5. New areas of irregular atelectasis or consolidation of the peripheral right lower lobe, nonspecific and most likely infectious or inflammatory. Metastatic disease distinctly not favored. Attention on follow-up. 6. Atrophic, scarred left kidney with nonobstructive calculi of the inferior pole. No hydronephrosis.   Aortic Atherosclerosis (ICD10-I70.0).     06/18/2024 -  Chemotherapy   5-FU and Bevacizumab  maintenance.    09/01/2024 Imaging   CT chest abdomen pelvis w contrast showed 1. Status post abdominoperineal resection with left lower quadrant end colostomy. No evidence of local recurrence. 2. Stable mildly enlarged/prominent retroperitoneal lymph nodes. 3. Stable tiny irregular cavitary pulmonary nodules. 4. Unchanged small hypodense lesion in the posterior right lobe of the liver. 5. No evidence of new or progressive disease in the chest, abdomen or  pelvis. 6. Aortic atherosclerosis.   Aortic Atherosclerosis (ICD10-I70.0).     Patient has bipolar and schizophrenia..  Patient is married  She has housing issue due to previous history of eviction.  she does not live with her husband currently. She lives with some family members.   + chronic dysuria and urgency she was seen by Urology and was started on Gemteza.  Symptoms are improved.    INTERVAL HISTORY DELCENIA INMAN is a 53 y.o. female who has above history reviewed by me today presents for follow up visit for rectal cancer.   Denies any melena or blood in the stool. Stable neuropathy symptoms. + loose BM, stable baseline.  recent exacerbation of COPD was managed with steroids, which significantly alleviated her symptoms.  She was prescribed antibiotics on October 28th for an infected wisdom tooth, which has improved but remains sensitive. She has one dose of antibiotics left and reports difficulty swallowing.  She takes eight pills of Tylenol  a day at 500 mg each for back /hip pain.    Review of Systems  Constitutional:  Positive for fatigue. Negative for appetite change, chills and fever.  HENT:   Negative for hearing loss, mouth sores and voice change.   Eyes:  Negative for eye problems.  Respiratory:  Negative for chest tightness, cough and shortness of breath.   Cardiovascular:  Negative for chest pain.  Gastrointestinal:  Positive for diarrhea. Negative for abdominal distention, abdominal pain, blood in stool and nausea.       + colostomy   Endocrine: Negative for hot  flashes.  Genitourinary:  Negative for difficulty urinating, dysuria and frequency.   Musculoskeletal:  Positive for arthralgias.  Skin:  Negative for itching and rash.  Neurological:  Positive for numbness. Negative for extremity weakness.  Hematological:  Negative for adenopathy.  Psychiatric/Behavioral:  Negative for confusion.     MEDICAL HISTORY:  Past Medical History:  Diagnosis Date    Allergy    pollen   Anxiety    Arthritis    right hip   Bipolar 1 disorder (HCC)    Cancer (HCC)    rectal   Chemotherapy-induced neuropathy 10/24/2022   Chronic kidney disease    COPD (chronic obstructive pulmonary disease) (HCC)    Depression    Family history of adverse reaction to anesthesia    grand father had a stroke during anesthesia   Family history of breast cancer    Family history of colon cancer    Family history of uterine cancer    GERD (gastroesophageal reflux disease)    History of kidney stones    Hyperlipidemia    Hypertension    Hypothyroidism    Panic attack    Pneumonia    Psoriasis    Sleep apnea 08/11/2021   No CPAP   Type 2 diabetes mellitus with microalbuminuria, without long-term current use of insulin  (HCC) 06/24/2019    SURGICAL HISTORY: Past Surgical History:  Procedure Laterality Date   BREAST BIOPSY Left 12/14/2021   us  bx, venus marker, path pending   CESAREAN SECTION     COLONOSCOPY WITH PROPOFOL  N/A 02/09/2022   Procedure: COLONOSCOPY WITH PROPOFOL ;  Surgeon: Unk Corinn Skiff, MD;  Location: [facility];  Service: Endoscopy;  Laterality: N/A;  sleep apnea   CYSTOSCOPY W/ RETROGRADES Left 11/08/2018   Procedure: CYSTOSCOPY WITH RETROGRADE PYELOGRAM;  Surgeon: Francisca Redell BROCKS, MD;  Location: [facility];  Service: Urology;  Laterality: Left;   CYSTOSCOPY/URETEROSCOPY/HOLMIUM LASER/STENT PLACEMENT Left 11/08/2018   Procedure: CYSTOSCOPY/URETEROSCOPY/HOLMIUM LASER/STENT PLACEMENT;  Surgeon: Francisca Redell BROCKS, MD;  Location: [facility];  Service: Urology;  Laterality: Left;   IR CV LINE INJECTION  06/25/2023   IR IMAGING GUIDED PORT INSERTION  05/11/2022   IR REMOVE CV FIBRIN SHEATH  06/27/2023   MOUTH SURGERY     wisdom teeth extraction   MOUTH SURGERY     teeth removal   POLYPECTOMY N/A 02/09/2022   Procedure: POLYPECTOMY;  Surgeon: Unk Corinn Skiff, MD;  Location: [facility];  Service: Endoscopy;  Laterality: N/A;   XI  ROBOTIC ASSISTED LOWER ANTERIOR RESECTION N/A 04/21/2022   Procedure: XI ROBOTIC ASSISTED LOWER ANTERIOR RESECTION WITH COLOSTOMY, BILATERAL TAP BLOCK, ASSESSMENT OF TISSUE PERFUSSION WITH FIREFLY INJECTION;  Surgeon: Teresa Lonni HERO, MD;  Location: [facility];  Service: General;  Laterality: N/A;    SOCIAL HISTORY: Social History   Socioeconomic History   Marital status: Married    Spouse name: Morene Jama Pereyra   Number of children: 1   Years of education: 12   Highest education level: High school graduate  Occupational History   Occupation: unemployed    Comment: disabled  Tobacco Use   Smoking status: Some Days    Current packs/day: 0.25    Average packs/day: 0.2 packs/day for 39.5 years (9.9 ttl pk-yrs)    Types: Cigarettes    Start date: 05/13/1985    Passive exposure: Current   Smokeless tobacco: Never   Tobacco comments:    5-6 cigarettes weekly- 11/07/2022  Vaping Use   Vaping status: Former   [employer]  date: 05/25/2018   Quit date: 09/24/2018  Substance and Sexual Activity   Alcohol use: Not Currently    Alcohol/week: 4.0 standard drinks of alcohol    Types: 4 Glasses of wine per week    Comment: quit ETOH in Feb. 2023   Drug use: Yes    Types: Marijuana    Comment: 2 days ago   Sexual activity: Not Currently    Birth control/protection: Post-menopausal  Other Topics Concern   Not on file  Social History Narrative   Lives with husband and son    Social Drivers of Health   Financial Resource Strain: Medium Risk (09/04/2024)   Overall Financial Resource Strain (CARDIA)    Difficulty of Paying Living Expenses: Somewhat hard  Food Insecurity: Food Insecurity Present (10/15/2024)   Hunger Vital Sign    Worried About Running Out of Food in the Last Year: Sometimes true    Ran Out of Food in the Last Year: Sometimes true  Transportation Needs: No Transportation Needs (09/04/2024)   PRAPARE - Administrator, Civil Service (Medical): No    Lack of  Transportation (Non-Medical): No  Physical Activity: Inactive (09/04/2024)   Exercise Vital Sign    Days of Exercise per Week: 0 days    Minutes of Exercise per Session: 0 min  Stress: Stress Concern Present (09/04/2024)   [employer] of Occupational Health - Occupational Stress Questionnaire    Feeling of Stress: To some extent  Social Connections: Moderately Integrated (09/04/2024)   Social Connection and Isolation Panel    Frequency of Communication with Friends and Family: More than three times a week    Frequency of Social Gatherings with Friends and Family: More than three times a week    Attends Religious Services: More than 4 times per year    Active Member of [employer] or Organizations: No    Attends Banker Meetings: Never    Marital Status: Married  Catering Manager Violence: Not At Risk (09/04/2024)   Humiliation, Afraid, Rape, and Kick questionnaire    Fear of Current or Ex-Partner: No    Emotionally Abused: No    Physically Abused: No    Sexually Abused: No    FAMILY HISTORY: Family History  Problem Relation Age of Onset   Depression Mother    Anxiety disorder Mother    Diabetes Mother    Hypertension Mother    Hyperlipidemia Mother    Cancer Mother    Uterine cancer Mother 58   Cervical cancer Mother 25   Colon cancer Father    Depression Brother    Anxiety disorder Brother    Cancer Maternal Aunt        unk types   Diabetes Mellitus II Maternal Grandmother    Hypercholesterolemia Maternal Grandmother    Breast cancer Maternal Grandmother    Cancer Paternal Grandmother    Diabetes Paternal Grandmother    Melanoma Paternal Grandmother    Stomach cancer Paternal Grandmother     ALLERGIES:  is allergic to metformin and related, nsaids, perphenazine, sulfa antibiotics, abilify [aripiprazole], and penicillins.  MEDICATIONS:  Current Outpatient Medications  Medication Sig Dispense Refill   acetaminophen  (TYLENOL ) 325 MG tablet Take 650 mg by  mouth every 6 (six) hours as needed for headache (pain).     albuterol  (VENTOLIN  HFA) 108 (90 Base) MCG/ACT inhaler Inhale 2 puffs into the lungs every 6 (six) hours as needed for wheezing or shortness of breath. 1 each 5   amantadine  (SYMMETREL )  100 MG capsule Take 100 mg by mouth 2 (two) times daily.     blood glucose meter kit and supplies Dispense based on patient and insurance preference. Use up to four times daily as directed. (FOR ICD-10 E10.9, E11.9). 1 each 0   budesonide -glycopyrrolate -formoterol  (BREZTRI  AEROSPHERE) 160-9-4.8 MCG/ACT AERO inhaler INHALE 2 PUFFS INTO THE LUNGS IN THE MORNING AND AT BEDTIME 10.7 g 0   clindamycin  (CLEOCIN ) 300 MG capsule Take 300 mg by mouth every 6 (six) hours.     clonazePAM (KLONOPIN) 0.5 MG tablet Take 1 mg by mouth 2 (two) times daily as needed.     gabapentin  (NEURONTIN ) 300 MG capsule Take 300 mg by mouth 2 (two) times daily.     levothyroxine  (SYNTHROID ) 75 MCG tablet Take 1 tablet (75 mcg total) by mouth daily before breakfast. 90 tablet 2   lidocaine -prilocaine  (EMLA ) cream Apply to affected area once 30 g 3   loperamide  (IMODIUM ) 2 MG capsule Take 1 capsule (2 mg total) by mouth See admin instructions. Initial: 4 mg,the 2 mg every 2 hours (4 mg every 4 hours at night)  maximum: 16 mg/day 120 capsule 2   loratadine  (CLARITIN ) 10 MG tablet Take 10 mg by mouth every morning.     nicotine  (NICODERM CQ  - DOSED IN MG/24 HOURS) 14 mg/24hr patch Place 1 patch (14 mg total) onto the skin daily. 30 patch 1   ondansetron  (ZOFRAN ) 8 MG tablet Take 1 tablet (8 mg total) by mouth every 8 (eight) hours as needed for nausea, vomiting or refractory nausea / vomiting. Start on the third day after chemotherapy. 30 tablet 1   paliperidone  (INVEGA  SUSTENNA) 234 MG/1.5ML SUSY injection Inject 234 mg into the muscle every 30 (thirty) days. On or about the 14th of each month     potassium chloride  SA (KLOR-CON  M) 20 MEQ tablet Take 1 tablet (20 mEq total) by mouth daily.  30 tablet 0   Spacer/Aero-Holding Chambers (AEROCHAMBER MV) inhaler Use as instructed 1 each 0   traMADol  (ULTRAM ) 50 MG tablet Take 1 tablet (50 mg total) by mouth every 8 (eight) hours as needed. 60 tablet 0   Vibegron  (GEMTESA ) 75 MG TABS Take 1 tablet (75 mg total) by mouth daily.     Cholecalciferol  (VITAMIN D -3) 125 MCG (5000 UT) TABS Take 5,000 Units by mouth daily. (Patient not taking: Reported on 11/05/2024) 30 tablet 1   diltiazem  (CARDIZEM  CD) 120 MG 24 hr capsule TAKE 1 CAPSULE BY MOUTH DAILY (Patient not taking: Reported on 11/05/2024) 90 capsule 1   docusate sodium  (COLACE) 100 MG capsule TAKE 1 CAPSULE BY MOUTH TWICE DAILY (Patient not taking: Reported on 11/05/2024) 60 capsule 2   FARXIGA  10 MG TABS tablet TAKE 1 TABLET BY MOUTH DAILY BEFORE BREAKFAST (Patient not taking: Reported on 11/05/2024) 90 tablet 0   icosapent  Ethyl (VASCEPA ) 1 g capsule Take 2 capsules (2 g total) by mouth in the morning and at bedtime. (Patient not taking: Reported on 11/05/2024) 120 capsule 2   magic mouthwash w/lidocaine  SOLN Take 5 mLs by mouth 4 (four) times daily as needed for mouth pain. Sig: Swish/Swallow 5-10 ml four times a day as needed. Dispense 480 ml. 1RF (Patient not taking: Reported on 11/05/2024) 480 mL 1   pantoprazole  (PROTONIX ) 40 MG tablet TAKE 1 TABLET BY MOUTH DAILY (Patient not taking: Reported on 11/05/2024) 90 tablet 2   pioglitazone  (ACTOS ) 15 MG tablet Take 1 tablet (15 mg total) by mouth daily. (Patient not taking: Reported  on 11/05/2024) 90 tablet 1   predniSONE  (STERAPRED UNI-PAK 21 TAB) 10 MG (21) TBPK tablet Day 1 take 6 tablets, Day 2 take 5 tablets, Day 3 take 4 tablets, Day 4 take 3 tablets, Day 5 take 2 tablets D6 take 1 tablet (Patient not taking: Reported on 11/05/2024) 1 each 0   promethazine  (PHENERGAN ) 12.5 MG tablet Take 1 tablet (12.5 mg total) by mouth every 6 (six) hours as needed for nausea or vomiting. (Patient not taking: Reported on 11/05/2024) 30 tablet 0    rosuvastatin  (CRESTOR ) 40 MG tablet TAKE 1 TABLET BY MOUTH EVERY MORNING (Patient not taking: Reported on 11/05/2024) 90 tablet 0   No current facility-administered medications for this visit.   Facility-Administered Medications Ordered in Other Visits  Medication Dose Route Frequency Provider Last Rate Last Admin   acetaminophen  (TYLENOL ) tablet 650 mg  650 mg Oral PRN Babara Call, MD       albuterol  (PROVENTIL ) (2.5 MG/3ML) 0.083% nebulizer solution 2.5 mg  2.5 mg Nebulization Once Dgayli, Khabib, MD       fluorouracil  (ADRUCIL ) 3,500 mg in sodium chloride  0.9 % 80 mL chemo infusion  2,400 mg/m2 (Order-Specific) Intravenous 1 day or 1 dose Jasaiah Karwowski, MD   3,500 mg at 11/05/24 1227   ondansetron  (ZOFRAN ) injection 8 mg  8 mg Intravenous PRN Eliora Nienhuis, MD   8 mg at 11/05/24 1022   sodium chloride  flush (NS) 0.9 % injection 10 mL  10 mL Intracatheter PRN Babara Call, MD   10 mL at 08/15/23 1327   sodium chloride  flush (NS) 0.9 % injection 10 mL  10 mL Intracatheter PRN Babara Call, MD   10 mL at 03/26/24 1255     PHYSICAL EXAMINATION: ECOG PERFORMANCE STATUS: 0 - Asymptomatic Vitals:   11/05/24 0918  BP: 129/76  Pulse: 79  Resp: 18  Temp: (!) 97.1 F (36.2 C)  SpO2: 98%     Filed Weights   11/05/24 0918  Weight: 118 lb 6.4 oz (53.7 kg)      Physical Exam Constitutional:      General: She is not in acute distress. HENT:     Head: Normocephalic and atraumatic.  Eyes:     General: No scleral icterus. Cardiovascular:     Rate and Rhythm: Normal rate and regular rhythm.  Pulmonary:     Effort: Pulmonary effort is normal. No respiratory distress.     Breath sounds: No wheezing.     Comments: Decreased breath sound bilaterally Abdominal:     General: Bowel sounds are normal. There is no distension.     Palpations: Abdomen is soft.     Comments: +colostomy   Musculoskeletal:        General: No deformity. Normal range of motion.     Cervical back: Normal range of motion and neck  supple.  Skin:    General: Skin is warm and dry.     Findings: No erythema or rash.  Neurological:     Mental Status: She is alert and oriented to person, place, and time. Mental status is at baseline.     Cranial Nerves: No cranial nerve deficit.     Coordination: Coordination normal.  Psychiatric:        Mood and Affect: Mood normal.     LABORATORY DATA:  I have reviewed the data as listed    Latest Ref Rng & Units 11/05/2024    9:00 AM 10/22/2024    8:21 AM 10/08/2024    8:44  AM  CBC  WBC 4.0 - 10.5 K/uL 4.6  7.0  6.2   Hemoglobin 12.0 - 15.0 g/dL 87.8  86.7  86.3   Hematocrit 36.0 - 46.0 % 36.7  39.7  40.4   Platelets 150 - 400 K/uL 163  130  142       Latest Ref Rng & Units 11/05/2024    9:00 AM 10/22/2024    8:21 AM 10/08/2024    8:44 AM  CMP  Glucose 70 - 99 mg/dL 881  867  887   BUN 6 - 20 mg/dL 18  11  19    Creatinine 0.44 - 1.00 mg/dL 9.11  9.05  9.01   Sodium 135 - 145 mmol/L 138  137  136   Potassium 3.5 - 5.1 mmol/L 3.9  4.2  4.4   Chloride 98 - 111 mmol/L 104  105  104   CO2 22 - 32 mmol/L 26  22  25    Calcium  8.9 - 10.3 mg/dL 8.7  8.7  9.0   Total Protein 6.5 - 8.1 g/dL 6.0  6.3  6.5   Total Bilirubin 0.0 - 1.2 mg/dL 0.4  0.5  0.4   Alkaline Phos 38 - 126 U/L 75  79  73   AST 15 - 41 U/L 22  27  22    ALT 0 - 44 U/L 16  19  12          RADIOGRAPHIC STUDIES: I have personally reviewed the radiological images as listed and agreed with the findings in the report. MR HIP RIGHT W WO CONTRAST Result Date: 10/15/2024 MR HIP WITHOUT THEN WITH IV CONTRAST RIGHT COMPARISON: None. CLINICAL HISTORY: Acute right hip pain. Associated sciatica. PULSE SEQUENCES: AX T1, Ax T2 FS, Cor T1, COR STIR & SMALL FOV COR PD FS with and without IV contrast. FINDINGS: Bones and labrum: Mild degenerative changes are present with slight chondromalacia and mild osteophyte formation. There is no accelerated arthrosis. No subchondral reactive edema or significant joint effusion is  present. There is no inflammatory enhancement identified. There is no fracture or contusion pattern. There is suggestion of a tear of the anterior labrum best seen on sagittal image 19 with a developing para labral cyst. Correlation for anterior labral symptoms. The pelvic ring, sacrum and SI joints are unremarkable. There is mild degenerative spondylosis with slight levoscoliosis of the lower lumbar spine. There are several benign renal cysts, right greater than left. There is atrophy of the left kidney which is incompletely evaluated. See prior CT report for more detail. Benign injection granulomas are present in the gluteal regions. Ostomy is seen in the left lower quadrant. Musculotendinous structures: There is mild insertional tendinosis of the gluteus minimus tendon. Otherwise, no significant tendinosis or myositis. No bursal collection is present. IMPRESSION: Mild degenerative changes in the right hip without accelerated arthrosis. Likely degenerative tear of the anterior labrum with a developing para labral cyst as above. Correlation for anterior labral symptoms. Otherwise, unremarkable MRI of the right hip. No suspicious enhancement following contrast administration. Electronically signed by: Norleen Satchel MD 10/15/2024 04:08 PM EDT RP Workstation: [id number]

## 2024-11-05 NOTE — Assessment & Plan Note (Signed)
 Chemotherapy plan as listed above

## 2024-11-05 NOTE — Patient Instructions (Signed)
 Leucovorin  Injection What is this medication? LEUCOVORIN  (loo koe VOR in) prevents side effects from certain medications, such as methotrexate. It works by increasing folate levels. This helps protect healthy cells in your body. It may also be used to treat anemia caused by low levels of folate. It can also be used with fluorouracil , a type of chemotherapy, to treat colorectal cancer. It works by increasing the effects of fluorouracil  in the body. This medicine may be used for other purposes; ask your health care provider or pharmacist if you have questions. What should I tell my care team before I take this medication? They need to know if you have any of these conditions: Anemia from low levels of vitamin B12 in the blood An unusual or allergic reaction to leucovorin , folic acid , other medications, foods, dyes, or preservatives Pregnant or trying to get pregnant Breastfeeding How should I use this medication? This medication is injected into a vein or a muscle. It is given by your care team in a hospital or clinic setting. Talk to your care team about the use of this medication in children. Special care may be needed. Overdosage: If you think you have taken too much of this medicine contact a poison control center or emergency room at once. NOTE: This medicine is only for you. Do not share this medicine with others. What if I miss a dose? Keep appointments for follow-up doses. It is important not to miss your dose. Call your care team if you are unable to keep an appointment. What may interact with this medication? Capecitabine  Fluorouracil  Phenobarbital Phenytoin Primidone Trimethoprim ;sulfamethoxazole This list may not describe all possible interactions. Give your health care provider a list of all the medicines, herbs, non-prescription drugs, or dietary supplements you use. Also tell them if you smoke, drink alcohol, or use illegal drugs. Some items may interact with your medicine. What  should I watch for while using this medication? Your condition will be monitored carefully while you are receiving this medication. This medication may increase the side effects of 5-fluorouracil . Tell your care team if you have diarrhea or mouth sores that do not get better or that get worse. What side effects may I notice from receiving this medication? Side effects that you should report to your care team as soon as possible: Allergic reactions--skin rash, itching, hives, swelling of the face, lips, tongue, or throat This list may not describe all possible side effects. Call your doctor for medical advice about side effects. You may report side effects to FDA at 1-800-FDA-1088. Where should I keep my medication? This medication is given in a hospital or clinic. It will not be stored at home. NOTE: This sheet is a summary. It may not cover all possible information. If you have questions about this medicine, talk to your doctor, pharmacist, or health care provider.  2024 Elsevier/Gold Standard (2022-05-16 00:00:00)Bevacizumab  Injection What is this medication? BEVACIZUMAB  (be va SIZ yoo mab) treats some types of cancer. It works by blocking a protein that causes cancer cells to grow and multiply. This helps to slow or stop the spread of cancer cells. It is a monoclonal antibody. This medicine may be used for other purposes; ask your health care provider or pharmacist if you have questions. COMMON BRAND NAME(S): Alymsys , Avastin , MVASI , Vegzalma, Zirabev  What should I tell my care team before I take this medication? They need to know if you have any of these conditions: Blood clots Coughing up blood Having or recent surgery Heart failure High  blood pressure History of a connection between 2 or more body parts that do not usually connect (fistula) History of a tear in your stomach or intestines Protein in your urine An unusual or allergic reaction to bevacizumab , other medications, foods,  dyes, or preservatives Pregnant or trying to get pregnant Breast-feeding How should I use this medication? This medication is injected into a vein. It is given by your care team in a hospital or clinic setting. Talk to your care team the use of this medication in children. Special care may be needed. Overdosage: If you think you have taken too much of this medicine contact a poison control center or emergency room at once. NOTE: This medicine is only for you. Do not share this medicine with others. What if I miss a dose? Keep appointments for follow-up doses. It is important not to miss your dose. Call your care team if you are unable to keep an appointment. What may interact with this medication? Interactions are not expected. This list may not describe all possible interactions. Give your health care provider a list of all the medicines, herbs, non-prescription drugs, or dietary supplements you use. Also tell them if you smoke, drink alcohol, or use illegal drugs. Some items may interact with your medicine. What should I watch for while using this medication? Your condition will be monitored carefully while you are receiving this medication. You may need blood work while taking this medication. This medication may make you feel generally unwell. This is not uncommon as chemotherapy can affect healthy cells as well as cancer cells. Report any side effects. Continue your course of treatment even though you feel ill unless your care team tells you to stop. This medication may increase your risk to bruise or bleed. Call your care team if you notice any unusual bleeding. Before having surgery, talk to your care team to make sure it is ok. This medication can increase the risk of poor healing of your surgical site or wound. You will need to stop this medication for 28 days before surgery. After surgery, wait at least 28 days before restarting this medication. Make sure the surgical site or wound is  healed enough before restarting this medication. Talk to your care team if questions. Talk to your care team if you may be pregnant. Serious birth defects can occur if you take this medication during pregnancy and for 6 months after the last dose. Contraception is recommended while taking this medication and for 6 months after the last dose. Your care team can help you find the option that works for you. Do not breastfeed while taking this medication and for 6 months after the last dose. This medication can cause infertility. Talk to your care team if you are concerned about your fertility. What side effects may I notice from receiving this medication? Side effects that you should report to your care team as soon as possible: Allergic reactions--skin rash, itching, hives, swelling of the face, lips, tongue, or throat Bleeding--bloody or black, tar-like stools, vomiting blood or brown material that looks like coffee grounds, red or dark brown urine, small red or purple spots on skin, unusual bruising or bleeding Blood clot--pain, swelling, or warmth in the leg, shortness of breath, chest pain Heart attack--pain or tightness in the chest, shoulders, arms, or jaw, nausea, shortness of breath, cold or clammy skin, feeling faint or lightheaded Heart failure--shortness of breath, swelling of the ankles, feet, or hands, sudden weight gain, unusual weakness or fatigue Increase  in blood pressure Infection--fever, chills, cough, sore throat, wounds that don't heal, pain or trouble when passing urine, general feeling of discomfort or being unwell Infusion reactions--chest pain, shortness of breath or trouble breathing, feeling faint or lightheaded Kidney injury--decrease in the amount of urine, swelling of the ankles, hands, or feet Stomach pain that is severe, does not go away, or gets worse Stroke--sudden numbness or weakness of the face, arm, or leg, trouble speaking, confusion, trouble walking, loss of  balance or coordination, dizziness, severe headache, change in vision Sudden and severe headache, confusion, change in vision, seizures, which may be signs of posterior reversible encephalopathy syndrome (PRES) Side effects that usually do not require medical attention (report to your care team if they continue or are bothersome): Back pain Change in taste Diarrhea Dry skin Increased tears Nosebleed This list may not describe all possible side effects. Call your doctor for medical advice about side effects. You may report side effects to FDA at 1-800-FDA-1088. Where should I keep my medication? This medication is given in a hospital or clinic. It will not be stored at home. NOTE: This sheet is a summary. It may not cover all possible information. If you have questions about this medicine, talk to your doctor, pharmacist, or health care provider.  2024 Elsevier/Gold Standard (2022-04-28 00:00:00)Fluorouracil  Injection What is this medication? FLUOROURACIL  (flure oh YOOR a sil) treats some types of cancer. It works by slowing down the growth of cancer cells. This medicine may be used for other purposes; ask your health care provider or pharmacist if you have questions. COMMON BRAND NAME(S): Adrucil  What should I tell my care team before I take this medication? They need to know if you have any of these conditions: Blood disorders Dihydropyrimidine dehydrogenase (DPD) deficiency Infection, such as chickenpox, cold sores, herpes Kidney disease Liver disease Poor nutrition Recent or ongoing radiation therapy An unusual or allergic reaction to fluorouracil , other medications, foods, dyes, or preservatives If you or your partner are pregnant or trying to get pregnant Breast-feeding How should I use this medication? This medication is injected into a vein. It is administered by your care team in a hospital or clinic setting. Talk to your care team about the use of this medication in  children. Special care may be needed. Overdosage: If you think you have taken too much of this medicine contact a poison control center or emergency room at once. NOTE: This medicine is only for you. Do not share this medicine with others. What if I miss a dose? Keep appointments for follow-up doses. It is important not to miss your dose. Call your care team if you are unable to keep an appointment. What may interact with this medication? Do not take this medication with any of the following: Live virus vaccines This medication may also interact with the following: Medications that treat or prevent blood clots, such as warfarin, enoxaparin , dalteparin This list may not describe all possible interactions. Give your health care provider a list of all the medicines, herbs, non-prescription drugs, or dietary supplements you use. Also tell them if you smoke, drink alcohol, or use illegal drugs. Some items may interact with your medicine. What should I watch for while using this medication? Your condition will be monitored carefully while you are receiving this medication. This medication may make you feel generally unwell. This is not uncommon as chemotherapy can affect healthy cells as well as cancer cells. Report any side effects. Continue your course of treatment even though you  feel ill unless your care team tells you to stop. In some cases, you may be given additional medications to help with side effects. Follow all directions for their use. This medication may increase your risk of getting an infection. Call your care team for advice if you get a fever, chills, sore throat, or other symptoms of a cold or flu. Do not treat yourself. Try to avoid being around people who are sick. This medication may increase your risk to bruise or bleed. Call your care team if you notice any unusual bleeding. Be careful brushing or flossing your teeth or using a toothpick because you may get an infection or bleed  more easily. If you have any dental work done, tell your dentist you are receiving this medication. Avoid taking medications that contain aspirin, acetaminophen , ibuprofen , naproxen , or ketoprofen unless instructed by your care team. These medications may hide a fever. Do not treat diarrhea with over the counter products. Contact your care team if you have diarrhea that lasts more than 2 days or if it is severe and watery. This medication can make you more sensitive to the sun. Keep out of the sun. If you cannot avoid being in the sun, wear protective clothing and sunscreen. Do not use sun lamps, tanning beds, or tanning booths. Talk to your care team if you or your partner wish to become pregnant or think you might be pregnant. This medication can cause serious birth defects if taken during pregnancy and for 3 months after the last dose. A reliable form of contraception is recommended while taking this medication and for 3 months after the last dose. Talk to your care team about effective forms of contraception. Do not father a child while taking this medication and for 3 months after the last dose. Use a condom while having sex during this time period. Do not breastfeed while taking this medication. This medication may cause infertility. Talk to your care team if you are concerned about your fertility. What side effects may I notice from receiving this medication? Side effects that you should report to your care team as soon as possible: Allergic reactions--skin rash, itching, hives, swelling of the face, lips, tongue, or throat Heart attack--pain or tightness in the chest, shoulders, arms, or jaw, nausea, shortness of breath, cold or clammy skin, feeling faint or lightheaded Heart failure--shortness of breath, swelling of the ankles, feet, or hands, sudden weight gain, unusual weakness or fatigue Heart rhythm changes--fast or irregular heartbeat, dizziness, feeling faint or lightheaded, chest pain,  trouble breathing High ammonia level--unusual weakness or fatigue, confusion, loss of appetite, nausea, vomiting, seizures Infection--fever, chills, cough, sore throat, wounds that don't heal, pain or trouble when passing urine, general feeling of discomfort or being unwell Low red blood cell level--unusual weakness or fatigue, dizziness, headache, trouble breathing Pain, tingling, or numbness in the hands or feet, muscle weakness, change in vision, confusion or trouble speaking, loss of balance or coordination, trouble walking, seizures Redness, swelling, and blistering of the skin over hands and feet Severe or prolonged diarrhea Unusual bruising or bleeding Side effects that usually do not require medical attention (report to your care team if they continue or are bothersome): Dry skin Headache Increased tears Nausea Pain, redness, or swelling with sores inside the mouth or throat Sensitivity to light Vomiting This list may not describe all possible side effects. Call your doctor for medical advice about side effects. You may report side effects to FDA at 1-800-FDA-1088. Where should I keep  my medication? This medication is given in a hospital or clinic. It will not be stored at home. NOTE: This sheet is a summary. It may not cover all possible information. If you have questions about this medicine, talk to your doctor, pharmacist, or health care provider.  2024 Elsevier/Gold Standard (2022-04-18 00:00:00)

## 2024-11-05 NOTE — Assessment & Plan Note (Addendum)
 Acute on chronic.    I have referred to orthopedics in June and she has not established care yet right hip MRI showed DJD  She requested pain medication as pain is not managed with Tylenol .  Recommend Tramadol  50mg  Q8h PRN.

## 2024-11-05 NOTE — Assessment & Plan Note (Signed)
 Recommend smoke cessation. .  Nicotin patch Rx was sent

## 2024-11-05 NOTE — Assessment & Plan Note (Signed)
 History of Stage III. pT3 pN1b cM0, s/p APR. 05/2023 Stage IV current rectal adenocarcinoma Currently on adjuvant chemotherapy. S/p FOLFOX x 4 cycles S/p concurrent chemotherapy [Xeloda  1300 mg BID] with RT, and then another 4 cycles of adjuvant FOLFOX [dose reduced oxaliplatin  and omit 5-FU bolus 05/2023 CT imaging indicates disease progression. liver biopsy pathology showed metastatic carcinoma--> 06/2023 FOLFIRI + Bevacizumab --> 10/2023 and 02/2024 CT partial response --> 05/2024 CT stable - Shared decision was made to hold off irinotecan  given stable disease--> 08/2024 CT stable.  NGS showed TP53 missense variant, APC frameshift, TCF7L2 Frameshift, FLT3 copy number gain., TMB 4.7, pMMR Wildtype KRAS/NRAS  Labs are reviewed and discussed with patient. Recent Signatera circulating tumor DNA is positive.  CT showed stable disease.  Proceed with 5-Fu + Bevacizumab 

## 2024-11-06 LAB — CEA: CEA: 5.7 ng/mL — ABNORMAL HIGH (ref 0.0–4.7)

## 2024-11-07 ENCOUNTER — Inpatient Hospital Stay

## 2024-11-12 ENCOUNTER — Encounter: Payer: Self-pay | Admitting: Oncology

## 2024-11-12 ENCOUNTER — Encounter: Payer: Self-pay | Admitting: Nurse Practitioner

## 2024-11-12 LAB — SIGNATERA
SIGNATERA MTM READOUT: 0.62 MTM/ml — AB
SIGNATERA TEST RESULT: POSITIVE — AB

## 2024-11-12 NOTE — Progress Notes (Signed)
 error

## 2024-11-13 ENCOUNTER — Inpatient Hospital Stay (HOSPITAL_BASED_OUTPATIENT_CLINIC_OR_DEPARTMENT_OTHER): Admitting: Hospice and Palliative Medicine

## 2024-11-13 DIAGNOSIS — C20 Malignant neoplasm of rectum: Secondary | ICD-10-CM

## 2024-11-13 NOTE — Progress Notes (Signed)
 VM left. Will reschedule.

## 2024-11-17 ENCOUNTER — Encounter: Payer: Self-pay | Admitting: Oncology

## 2024-11-21 ENCOUNTER — Other Ambulatory Visit: Payer: Self-pay | Admitting: Family Medicine

## 2024-11-21 DIAGNOSIS — I10 Essential (primary) hypertension: Secondary | ICD-10-CM

## 2024-11-21 DIAGNOSIS — N1831 Chronic kidney disease, stage 3a: Secondary | ICD-10-CM

## 2024-11-21 DIAGNOSIS — R000 Tachycardia, unspecified: Secondary | ICD-10-CM

## 2024-11-21 DIAGNOSIS — E1129 Type 2 diabetes mellitus with other diabetic kidney complication: Secondary | ICD-10-CM

## 2024-11-25 NOTE — Telephone Encounter (Signed)
 Requested medications are due for refill today.  yes  Requested medications are on the active medications list.  yes  Last refill. Varied   Future visit scheduled.   no  Notes to clinic.  Pt last seen 02/28/2024    Requested Prescriptions  Pending Prescriptions Disp Refills   diltiazem  (CARDIZEM  CD) 120 MG 24 hr capsule [Pharmacy Med Name: DILTIAZEM  HCL ER COATED BEADS 120 M] 90 capsule 1    Sig: TAKE 1 CAPSULE BY MOUTH DAILY     Cardiovascular: Calcium  Channel Blockers 3 Passed - 11/25/2024  5:23 PM      Passed - ALT in normal range and within 360 days    ALT  Date Value Ref Range Status  11/05/2024 16 0 - 44 U/L Final   SGPT (ALT)  Date Value Ref Range Status  04/20/2015 30 U/L Final    Comment:    14-54 NOTE: New Reference Range  03/02/15          Passed - AST in normal range and within 360 days    AST  Date Value Ref Range Status  11/05/2024 22 15 - 41 U/L Final         Passed - Cr in normal range and within 360 days    Creatinine  Date Value Ref Range Status  11/05/2024 0.88 0.44 - 1.00 mg/dL Final   Creat  Date Value Ref Range Status  10/05/2022 1.08 (H) 0.50 - 0.99 mg/dL Final   Creatinine, Urine  Date Value Ref Range Status  06/04/2023 39 20 - 275 mg/dL Final         Passed - Last BP in normal range    BP Readings from Last 1 Encounters:  11/07/24 112/79         Passed - Last Heart Rate in normal range    Pulse Readings from Last 1 Encounters:  11/07/24 100         Passed - Valid encounter within last 6 months    Recent Outpatient Visits           9 months ago URI, acute   [facility] [place] Mire, MD   9 months ago Type 2 diabetes mellitus with stage 3a chronic kidney disease, without long-term current use of insulin  [facility])   [facility] [place], Mire, MD               pioglitazone  (ACTOS ) 15 MG tablet [Pharmacy Med Name: PIOGLITAZONE  HCL 15 MG TAB] 90 tablet 1    Sig: TAKE  1 TABLET BY MOUTH DAILY     Endocrinology:  Diabetes - Glitazones - pioglitazone  Failed - 11/25/2024  5:23 PM      Failed - HBA1C is between 0 and 7.9 and within 180 days    Hemoglobin A1C  Date Value Ref Range Status  02/05/2024 7.3 (A) 4.0 - 5.6 % Final  07/11/2022 7.8  Final         Passed - Valid encounter within last 6 months    Recent Outpatient Visits           9 months ago URI, acute   [facility] Glenard Mire, MD   9 months ago Type 2 diabetes mellitus with stage 3a chronic kidney disease, without long-term current use of insulin  [facility])    [facility], Krichna, MD               dapagliflozin  propanediol (FARXIGA )  10 MG TABS tablet [Pharmacy Med Name: DAPAGLIFLOZIN  PROPANEDIOL 10 MG TAB] 90 tablet 0    Sig: TAKE 1 TABLET BY MOUTH DAILY BEFORE BREAKFAST     Endocrinology:  Diabetes - SGLT2 Inhibitors Failed - 11/25/2024  5:23 PM      Failed - HBA1C is between 0 and 7.9 and within 180 days    Hemoglobin A1C  Date Value Ref Range Status  02/05/2024 7.3 (A) 4.0 - 5.6 % Final  07/11/2022 7.8  Final         Passed - Cr in normal range and within 360 days    Creatinine  Date Value Ref Range Status  11/05/2024 0.88 0.44 - 1.00 mg/dL Final   Creat  Date Value Ref Range Status  10/05/2022 1.08 (H) 0.50 - 0.99 mg/dL Final   Creatinine, Urine  Date Value Ref Range Status  06/04/2023 39 20 - 275 mg/dL Final         Passed - eGFR in normal range and within 360 days    GFR, Est African American  Date Value Ref Range Status  06/20/2021 80 > OR = 60 mL/min/1.79m2 Final   GFR, Est Non African American  Date Value Ref Range Status  06/20/2021 69 > OR = 60 mL/min/1.74m2 Final   GFR, Estimated  Date Value Ref Range Status  11/05/2024 >60 >60 mL/min Final    Comment:    (NOTE) Calculated using the CKD-EPI Creatinine Equation (2021)          Passed - Valid encounter within last 6 months    Recent  Outpatient Visits           9 months ago URI, acute   [facility] Glenard Mire, MD   9 months ago Type 2 diabetes mellitus with stage 3a chronic kidney disease, without long-term current use of insulin  [facility])   [facility] Glenard Mire, MD

## 2024-11-26 ENCOUNTER — Inpatient Hospital Stay: Admitting: Oncology

## 2024-11-26 ENCOUNTER — Telehealth: Payer: Self-pay | Admitting: Oncology

## 2024-11-26 ENCOUNTER — Other Ambulatory Visit: Payer: Self-pay | Admitting: Oncology

## 2024-11-26 ENCOUNTER — Inpatient Hospital Stay

## 2024-11-26 ENCOUNTER — Inpatient Hospital Stay: Attending: Oncology

## 2024-11-26 NOTE — Telephone Encounter (Signed)
 Needs appt

## 2024-11-26 NOTE — Telephone Encounter (Signed)
Lvm asking pt to return call to schedule appt for refills

## 2024-11-26 NOTE — Telephone Encounter (Signed)
 Patient unable to get to her appointments today. She has missed placed her phone and can't contact [employer] for transport. She will call this afternoon to reschedule.

## 2024-11-28 ENCOUNTER — Inpatient Hospital Stay

## 2024-11-28 ENCOUNTER — Encounter: Payer: Self-pay | Admitting: Oncology

## 2024-11-28 ENCOUNTER — Telehealth: Payer: Self-pay | Admitting: Oncology

## 2024-11-28 NOTE — Telephone Encounter (Signed)
 Called pt to confirm appts date and time for next week. Dates have been updated in IS.

## 2024-11-28 NOTE — Telephone Encounter (Signed)
 Pt called to r/s missed appts - lab/MD/infusion - r/s w/help of Rosina - pt confirmed date/time - LH

## 2024-12-03 ENCOUNTER — Inpatient Hospital Stay

## 2024-12-03 ENCOUNTER — Inpatient Hospital Stay: Attending: Oncology

## 2024-12-03 ENCOUNTER — Inpatient Hospital Stay: Attending: Oncology | Admitting: Oncology

## 2024-12-03 ENCOUNTER — Encounter: Payer: Self-pay | Admitting: Oncology

## 2024-12-03 VITALS — BP 131/91 | HR 85

## 2024-12-03 VITALS — BP 141/89 | HR 100 | Temp 96.3°F | Resp 18 | Wt 118.1 lb

## 2024-12-03 DIAGNOSIS — T451X5A Adverse effect of antineoplastic and immunosuppressive drugs, initial encounter: Secondary | ICD-10-CM | POA: Diagnosis not present

## 2024-12-03 DIAGNOSIS — Z933 Colostomy status: Secondary | ICD-10-CM | POA: Insufficient documentation

## 2024-12-03 DIAGNOSIS — C787 Secondary malignant neoplasm of liver and intrahepatic bile duct: Secondary | ICD-10-CM | POA: Diagnosis not present

## 2024-12-03 DIAGNOSIS — G620 Drug-induced polyneuropathy: Secondary | ICD-10-CM | POA: Diagnosis not present

## 2024-12-03 DIAGNOSIS — C20 Malignant neoplasm of rectum: Secondary | ICD-10-CM | POA: Diagnosis present

## 2024-12-03 DIAGNOSIS — I129 Hypertensive chronic kidney disease with stage 1 through stage 4 chronic kidney disease, or unspecified chronic kidney disease: Secondary | ICD-10-CM | POA: Diagnosis not present

## 2024-12-03 DIAGNOSIS — D122 Benign neoplasm of ascending colon: Secondary | ICD-10-CM | POA: Diagnosis not present

## 2024-12-03 DIAGNOSIS — G4730 Sleep apnea, unspecified: Secondary | ICD-10-CM | POA: Insufficient documentation

## 2024-12-03 DIAGNOSIS — C779 Secondary and unspecified malignant neoplasm of lymph node, unspecified: Secondary | ICD-10-CM | POA: Insufficient documentation

## 2024-12-03 DIAGNOSIS — F17210 Nicotine dependence, cigarettes, uncomplicated: Secondary | ICD-10-CM | POA: Insufficient documentation

## 2024-12-03 DIAGNOSIS — I700 Atherosclerosis of aorta: Secondary | ICD-10-CM | POA: Insufficient documentation

## 2024-12-03 DIAGNOSIS — Z8701 Personal history of pneumonia (recurrent): Secondary | ICD-10-CM | POA: Insufficient documentation

## 2024-12-03 DIAGNOSIS — Z720 Tobacco use: Secondary | ICD-10-CM | POA: Diagnosis not present

## 2024-12-03 DIAGNOSIS — F319 Bipolar disorder, unspecified: Secondary | ICD-10-CM | POA: Diagnosis not present

## 2024-12-03 DIAGNOSIS — Z923 Personal history of irradiation: Secondary | ICD-10-CM | POA: Insufficient documentation

## 2024-12-03 DIAGNOSIS — E1122 Type 2 diabetes mellitus with diabetic chronic kidney disease: Secondary | ICD-10-CM | POA: Insufficient documentation

## 2024-12-03 DIAGNOSIS — F209 Schizophrenia, unspecified: Secondary | ICD-10-CM | POA: Insufficient documentation

## 2024-12-03 DIAGNOSIS — E785 Hyperlipidemia, unspecified: Secondary | ICD-10-CM | POA: Diagnosis not present

## 2024-12-03 DIAGNOSIS — Z5111 Encounter for antineoplastic chemotherapy: Secondary | ICD-10-CM | POA: Diagnosis not present

## 2024-12-03 DIAGNOSIS — E039 Hypothyroidism, unspecified: Secondary | ICD-10-CM | POA: Diagnosis not present

## 2024-12-03 DIAGNOSIS — N200 Calculus of kidney: Secondary | ICD-10-CM | POA: Diagnosis not present

## 2024-12-03 DIAGNOSIS — Z87442 Personal history of urinary calculi: Secondary | ICD-10-CM | POA: Insufficient documentation

## 2024-12-03 DIAGNOSIS — K219 Gastro-esophageal reflux disease without esophagitis: Secondary | ICD-10-CM | POA: Insufficient documentation

## 2024-12-03 DIAGNOSIS — Z5112 Encounter for antineoplastic immunotherapy: Secondary | ICD-10-CM | POA: Insufficient documentation

## 2024-12-03 DIAGNOSIS — Z803 Family history of malignant neoplasm of breast: Secondary | ICD-10-CM | POA: Insufficient documentation

## 2024-12-03 DIAGNOSIS — J441 Chronic obstructive pulmonary disease with (acute) exacerbation: Secondary | ICD-10-CM | POA: Diagnosis not present

## 2024-12-03 DIAGNOSIS — I2510 Atherosclerotic heart disease of native coronary artery without angina pectoris: Secondary | ICD-10-CM | POA: Insufficient documentation

## 2024-12-03 DIAGNOSIS — E1140 Type 2 diabetes mellitus with diabetic neuropathy, unspecified: Secondary | ICD-10-CM | POA: Diagnosis not present

## 2024-12-03 DIAGNOSIS — Z7984 Long term (current) use of oral hypoglycemic drugs: Secondary | ICD-10-CM | POA: Insufficient documentation

## 2024-12-03 DIAGNOSIS — Z800 Family history of malignant neoplasm of digestive organs: Secondary | ICD-10-CM | POA: Insufficient documentation

## 2024-12-03 DIAGNOSIS — Z79899 Other long term (current) drug therapy: Secondary | ICD-10-CM | POA: Insufficient documentation

## 2024-12-03 LAB — CBC WITH DIFFERENTIAL (CANCER CENTER ONLY)
Abs Immature Granulocytes: 0.07 K/uL (ref 0.00–0.07)
Basophils Absolute: 0.1 K/uL (ref 0.0–0.1)
Basophils Relative: 1 %
Eosinophils Absolute: 0.1 K/uL (ref 0.0–0.5)
Eosinophils Relative: 1 %
HCT: 42.4 % (ref 36.0–46.0)
Hemoglobin: 14 g/dL (ref 12.0–15.0)
Immature Granulocytes: 1 %
Lymphocytes Relative: 9 %
Lymphs Abs: 0.9 K/uL (ref 0.7–4.0)
MCH: 33.7 pg (ref 26.0–34.0)
MCHC: 33 g/dL (ref 30.0–36.0)
MCV: 102.2 fL — ABNORMAL HIGH (ref 80.0–100.0)
Monocytes Absolute: 0.8 K/uL (ref 0.1–1.0)
Monocytes Relative: 8 %
Neutro Abs: 8.2 K/uL — ABNORMAL HIGH (ref 1.7–7.7)
Neutrophils Relative %: 80 %
Platelet Count: 142 K/uL — ABNORMAL LOW (ref 150–400)
RBC: 4.15 MIL/uL (ref 3.87–5.11)
RDW: 15.9 % — ABNORMAL HIGH (ref 11.5–15.5)
WBC Count: 10 K/uL (ref 4.0–10.5)
nRBC: 0 % (ref 0.0–0.2)

## 2024-12-03 LAB — CMP (CANCER CENTER ONLY)
ALT: 16 U/L (ref 0–44)
AST: 21 U/L (ref 15–41)
Albumin: 4 g/dL (ref 3.5–5.0)
Alkaline Phosphatase: 115 U/L (ref 38–126)
Anion gap: 12 (ref 5–15)
BUN: 11 mg/dL (ref 6–20)
CO2: 27 mmol/L (ref 22–32)
Calcium: 9.6 mg/dL (ref 8.9–10.3)
Chloride: 101 mmol/L (ref 98–111)
Creatinine: 0.92 mg/dL (ref 0.44–1.00)
GFR, Estimated: >60 mL/min (ref >60)
Glucose, Bld: 115 mg/dL — ABNORMAL HIGH (ref 70–99)
Potassium: 4.3 mmol/L (ref 3.5–5.1)
Sodium: 139 mmol/L (ref 135–145)
Total Bilirubin: 0.3 mg/dL (ref 0.0–1.2)
Total Protein: 6.4 g/dL — ABNORMAL LOW (ref 6.5–8.1)

## 2024-12-03 LAB — TOTAL PROTEIN, URINE DIPSTICK: Protein, ur: 30 mg/dL — AB

## 2024-12-03 LAB — CEA: CEA: 4.5 ng/mL (ref 0.0–4.7)

## 2024-12-03 MED ORDER — DEXAMETHASONE SOD PHOSPHATE PF 10 MG/ML IJ SOLN
10.0000 mg | Freq: Once | INTRAMUSCULAR | Status: AC
  Administered 2024-12-03: 10 mg via INTRAVENOUS

## 2024-12-03 MED ORDER — SODIUM CHLORIDE 0.9 % IV SOLN
400.0000 mg/m2 | Freq: Once | INTRAVENOUS | Status: AC
  Administered 2024-12-03: 596 mg via INTRAVENOUS
  Filled 2024-12-03: qty 29.8

## 2024-12-03 MED ORDER — SODIUM CHLORIDE 0.9 % IV SOLN
2400.0000 mg/m2 | INTRAVENOUS | Status: DC
  Administered 2024-12-03: 3500 mg via INTRAVENOUS
  Filled 2024-12-03: qty 70

## 2024-12-03 MED ORDER — ONDANSETRON HCL 4 MG/2ML IJ SOLN
8.0000 mg | INTRAMUSCULAR | Status: DC | PRN
  Administered 2024-12-03: 8 mg via INTRAVENOUS
  Filled 2024-12-03: qty 4

## 2024-12-03 MED ORDER — SODIUM CHLORIDE 0.9 % IV SOLN
5.0000 mg/kg | Freq: Once | INTRAVENOUS | Status: AC
  Administered 2024-12-03: 275 mg via INTRAVENOUS
  Filled 2024-12-03: qty 11

## 2024-12-03 MED ORDER — SODIUM CHLORIDE 0.9 % IV SOLN
Freq: Once | INTRAVENOUS | Status: AC
  Filled 2024-12-03: qty 250

## 2024-12-03 MED ORDER — FLUOROURACIL CHEMO INJECTION 2.5 GM/50ML
400.0000 mg/m2 | Freq: Once | INTRAVENOUS | Status: AC
  Administered 2024-12-03: 600 mg via INTRAVENOUS
  Filled 2024-12-03: qty 12

## 2024-12-03 MED ORDER — ACETAMINOPHEN 325 MG PO TABS
650.0000 mg | ORAL_TABLET | ORAL | Status: DC | PRN
  Administered 2024-12-03: 650 mg via ORAL
  Filled 2024-12-03: qty 2

## 2024-12-03 NOTE — Assessment & Plan Note (Signed)
 Chemotherapy plan as listed above

## 2024-12-03 NOTE — Progress Notes (Signed)
 Hematology/Oncology Progress note [phone] [phone]      CHIEF COMPLAINTS/REASON FOR VISIT:  Follow-up for Stage IV  rectal cancer treatments.  ASSESSMENT & PLAN:   Cancer Staging  Rectal cancer [facility]) Staging form: Luna and Rectum, AJCC 8th Edition - Pathologic stage from 05/03/2022: Stage IIIB (pT3, pN1b, cM0) - Signed by Babara Call, MD on 05/03/2022 - Pathologic stage from 06/18/2023: Stage IVA (rpTX, rpN0, rpM1a) - Signed by Babara Call, MD on 06/18/2023   Rectal cancer [facility]) History of Stage III. pT3 pN1b cM0, s/p APR. 05/2023 Stage IV current rectal adenocarcinoma Currently on adjuvant chemotherapy. S/p FOLFOX x 4 cycles S/p concurrent chemotherapy [Xeloda  1300 mg BID] with RT, and then another 4 cycles of adjuvant FOLFOX [dose reduced oxaliplatin  and omit 5-FU bolus 05/2023 CT imaging indicates disease progression. liver biopsy pathology showed metastatic carcinoma--> 06/2023 FOLFIRI + Bevacizumab --> 10/2023 and 02/2024 CT partial response --> 05/2024 CT stable - Shared decision was made to hold off irinotecan  given stable disease--> 08/2024 CT stable.  NGS showed TP53 missense variant, APC frameshift, TCF7L2 Frameshift, FLT3 copy number gain., TMB 4.7, pMMR Wildtype KRAS/NRAS  Labs are reviewed and discussed with patient. Recent Signatera circulating tumor DNA is positive.   Proceed with 5-Fu + Bevacizumab  CEA is rising Repeat CT.    Chemotherapy-induced neuropathy Grade 2  Continue gabapentin  300mg  BID.   Encounter for antineoplastic chemotherapy Chemotherapy plan as listed above  Tobacco use Recommend smoke cessation. .  Nicotin patch Rx was sent    Orders Placed This Encounter  Procedures   CT CHEST ABDOMEN PELVIS W CONTRAST    Standing Status:   Future    Expected Date:   12/15/2024    Expiration Date:   12/03/2025    If indicated for the ordered procedure, I authorize the administration of contrast media per Radiology protocol:   Yes     Does the patient have a contrast media/X-ray dye allergy?:   No    Is patient pregnant?:   No    Preferred imaging location?:   [facility]    If indicated for the ordered procedure, I authorize the administration of oral contrast media per Radiology protocol:   Yes   Total Protein, Urine dipstick    Standing Status:   Future    Expected Date:   12/24/2024    Expiration Date:   12/24/2025   CEA    Standing Status:   Future    Expected Date:   12/24/2024    Expiration Date:   12/24/2025   CBC with Differential (Cancer Center Only)    Standing Status:   Future    Expected Date:   12/24/2024    Expiration Date:   12/24/2025   CMP (Cancer Center only)    Standing Status:   Future    Expected Date:   12/24/2024    Expiration Date:   12/24/2025    Follow-up 3 week(s)  All questions were answered. The patient knows to call the clinic with any problems, questions or concerns.  Call Babara, MD, PhD [facility] Hematology Oncology 12/03/2024     HISTORY OF PRESENTING ILLNESS:   April Luna is a  53 y.o.  female presents for treatment of rectal cancer Oncology History  Rectal cancer (HCC)  02/26/2022 Imaging   MRI PELVIS WITHOUT CONTRAST- By imaging, rectal cancer stage:  T1/T2, N0, Mx    03/02/2022 Imaging   CT CHEST AND ABDOMEN WITH CONTRAST 1. No convincing evidence of metastatic  disease within the chest or abdomen. 2. Atrophic left kidney with multifocal renal scarring and cortical calcifications as well as nonobstructive renal stones measuring up to 5 mm. 3. Prominent left-sided predominant retroperitoneal lymph nodes measuring up to 8 mm near the level of the renal hilum, overall decreased in size dating back to CT September 19, 2018 and favored reactive related to left renal inflammation. 4.  Aortic Atherosclerosis (ICD10-I70.0).   03/27/2022 Genetic Testing    [employer] Custom[employer]+RNA cancer panel found no pathogenic mutations.    04/21/2022 Initial Diagnosis   Rectal  cancer - baseline CEA 3.6 -02/09/2022, patient had colonoscopy which showed renal mass 10 cm from anal verge.  5 mm polyp in ascending Luna.  Removed and retrieved. Pathology showed rectal adenocarcinoma.  The polyp in the ascending Luna is a tubular adenoma.  MRI showed cT1/T2N0 disease  -04/21/2022, patient underwent robotic assisted ultralow anterior resection. Pathology showed moderately differentiated adenocarcinoma, 4.5 cm in maximal extent, with focal extension through muscularis propria into perirectal soft tissue.  3 lymph nodes positive for metastatic carcinoma.  Negative margin.  pT3 pN1b, MSI stable.   05/03/2022 Cancer Staging   Staging form: Luna and Rectum, AJCC 8th Edition - Pathologic stage from 05/03/2022: Stage IIIB (pT3, pN1b, cM0) - Signed by Babara Call, MD on 05/03/2022 Stage prefix: Initial diagnosis   05/11/2022 Miscellaneous   Medi port placed by Dr.White   05/26/2022 -  Chemotherapy   FOLFOX Q2 weeks x 4   05/26/2022 - 07/09/2022 Chemotherapy   Patient is on Treatment Plan : COLORECTAL FOLFOX q14d x 8 cycles     05/26/2022 - 12/13/2022 Chemotherapy   Patient is on Treatment Plan : COLORECTAL FOLFOX q14d x 4 months     07/27/2022 -  Chemotherapy   Concurrent chemotherapy- Xeloda   1300mg  BID and radiation.    07/27/2022 - 09/12/2022 Radiation Therapy    concurrent chemotherapy [Xeloda  1300 mg BID] with RT    09/20/2022 Imaging   CT Angiogram chest PET protocol There is no evidence of pulmonary artery embolism. There is no evidence of thoracic aortic dissection.   Small linear patchy alveolar infiltrate is seen in medial segment of right middle lobe suggesting atelectasis/pneumonia.     09/28/2022 - 09/30/2022 Hospital Admission   Admission due to acute respiratory failure with hypoxia, COPD exacerbation   09/28/2022 Imaging   CT angio chest PE protocol 1. Negative for acute PE or thoracic aortic dissection. 2. New ground-glass infiltrates anteriorly in bilateral upper  lobes,may represent atypical edema, infectious or inflammatory process. 3.  Aortic Atherosclerosis     12/22/2022 Imaging   CT chest abdomen pelvis w contrast  Stable exam. No evidence of recurrent or metastatic carcinoma within the chest, abdomen, or pelvis.   Left nephrolithiasis and renal parenchymal atrophy. No evidence of ureteral calculi or hydronephrosis.   Aortic Atherosclerosis   05/31/2023 Imaging   CT chest abdomen pelvis with contrast showed 1. New hypodense 16 mm lesion in the right lobe of the liver with very subtle adjacent 5 mm lesion, suspicious for metastatic disease. 2. Small bilateral pulmonary nodules, some of which were subtly evident on prior examination only in retrospect, some of which demonstrate degree of cavitation, suspicious for metastatic disease. 3. Prominent retroperitoneal lymph nodes are similar prior. 4. Prior low anterior resection with Hartmann's pouch formation,similar small amount of perirectal/presacral soft tissue and fluid,favored postsurgical/posttreatment change. Suggest continued attention on follow-up imaging. 5. Large volume of formed stool in the Luna. 6. Nonobstructive left renal calculi  measure under 1 cm.     06/08/2023 Relapse/Recurrence   Ultrasound-guided liver biopsy showed Pathology showed metastatic carcinoma, morphology consistent with patient's clinical history of rectal adenocarcinoma.  [employer] NGS 648 gene panel showed TP53 missense variant, APC frameshift, TCF7L2 frameshift, FLT3 copy number gain, TMB 4.38m/MB MSI stable.  Wildtype KRAS/NRAS  [employer] RNA Seq - ERBB3 overexpressed, VEGFA overexpressed, NRAS overexpressed.     06/18/2023 Cancer Staging   Staging form: Luna and Rectum, AJCC 8th Edition - Pathologic stage from 06/18/2023: Stage IVA (rpTX, pN0, pM1a) - Signed by Babara Call, MD on 06/18/2023 Stage prefix: Recurrence Total positive nodes: 0   07/02/2023 - 06/04/2024 Chemotherapy   COLORECTAL FOLFIRI + Bevacizumab   q14d      11/13/2023 Imaging   CT chest abdomen pelvis w contrast showed  1. Increase in cavitation with decreased soft tissue involving the scattered bilateral pulmonary nodules, consistent with treatment response. No new suspicious pulmonary nodules or masses. 2. Decrease in size and conspicuity of the hypodense lesions in the right lobe of the liver, consistent with treatment response. No new suspicious hepatic lesion identified. 3. Stable prominent nonspecific retroperitoneal lymph nodes. Suggest continued attention on follow-up imaging. 4. Similar surgical changes of prior low anterior resection with left anterior abdominal wall colostomy. Similar mesorectal/presacral soft tissue, no new suspicious enhancing nodularity. 5. Similar soft tissue nodularity in the bilateral posterior gluteal subcutaneous soft tissues. 6. Moderate volume of formed stool in the Luna. Correlate for constipation.   03/03/2024 Imaging   CT chest abdomen pelvis w contrast showed 1. 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 2. No new or progressive disease. 3. Similar prominent but not pathologically sized abdominal retroperitoneal nodes, nonspecific. 4. Status post low anterior resection with descending colostomy. Possible constipation. 5. Age advanced coronary artery atherosclerosis. Recommend assessment of coronary risk factors. 6.  Aortic Atherosclerosis (ICD10-I70.0). 7. Left nephrolithiasis and renal scarring.    05/27/2024 Imaging   CT chest abdomen pelvis w contrast showed 1. Status post abdominoperineal resection with left lower quadrant end colostomy. 2. Unchanged enlarged retroperitoneal lymph nodes. 3. Unchanged small hypodense lesion of the posterior right lobe of the liver. 4. Very tiny, irregular cavitary nodules are unchanged. 5. New areas of irregular atelectasis or consolidation of the peripheral right lower lobe, nonspecific and most likely infectious or  inflammatory. Metastatic disease distinctly not favored. Attention on follow-up. 6. Atrophic, scarred left kidney with nonobstructive calculi of the inferior pole. No hydronephrosis.   Aortic Atherosclerosis (ICD10-I70.0).     06/18/2024 -  Chemotherapy   5-FU and Bevacizumab  maintenance.    09/01/2024 Imaging   CT chest abdomen pelvis w contrast showed 1. Status post abdominoperineal resection with left lower quadrant end colostomy. No evidence of local recurrence. 2. Stable mildly enlarged/prominent retroperitoneal lymph nodes. 3. Stable tiny irregular cavitary pulmonary nodules. 4. Unchanged small hypodense lesion in the posterior right lobe of the liver. 5. No evidence of new or progressive disease in the chest, abdomen or pelvis. 6. Aortic atherosclerosis.   Aortic Atherosclerosis (ICD10-I70.0).     Patient has bipolar and schizophrenia..  Patient is married  She has housing issue due to previous history of eviction.  she does not live with her husband currently. She lives with her son.   + chronic dysuria and urgency she was seen by Urology and was started on Gemteza.   She has no new complains.     INTERVAL HISTORY April Luna is a 53 y.o. female who has above history  reviewed by me today presents for follow up visit for rectal cancer.   Denies any melena or blood in the stool. Stable neuropathy symptoms. + loose BM, stable baseline.  recent exacerbation of COPD was managed with steroids, which significantly alleviated her symptoms.  She was prescribed antibiotics on October 28th for an infected wisdom tooth, which has improved but remains sensitive. She has one dose of antibiotics left and reports difficulty swallowing.  She takes eight pills of Tylenol  a day at 500 mg each for back /hip pain.    Review of Systems  Constitutional:  Positive for fatigue. Negative for appetite change, chills and fever.  HENT:   Negative for hearing loss, mouth sores and  voice change.   Eyes:  Negative for eye problems.  Respiratory:  Negative for chest tightness, cough and shortness of breath.   Cardiovascular:  Negative for chest pain.  Gastrointestinal:  Negative for abdominal distention, abdominal pain, blood in stool, diarrhea and nausea.       + colostomy   Endocrine: Negative for hot flashes.  Genitourinary:  Negative for difficulty urinating, dysuria and frequency.   Musculoskeletal:  Positive for arthralgias.  Skin:  Negative for itching and rash.  Neurological:  Positive for numbness. Negative for extremity weakness.  Hematological:  Negative for adenopathy.  Psychiatric/Behavioral:  Negative for confusion.     MEDICAL HISTORY:  Past Medical History:  Diagnosis Date   Allergy    pollen   Anxiety    Arthritis    right hip   Bipolar 1 disorder (HCC)    Cancer (HCC)    rectal   Chemotherapy-induced neuropathy 10/24/2022   Chronic kidney disease    COPD (chronic obstructive pulmonary disease) (HCC)    Depression    Family history of adverse reaction to anesthesia    grand father had a stroke during anesthesia   Family history of breast cancer    Family history of Luna cancer    Family history of uterine cancer    GERD (gastroesophageal reflux disease)    History of kidney stones    Hyperlipidemia    Hypertension    Hypothyroidism    Panic attack    Pneumonia    Psoriasis    Sleep apnea 08/11/2021   No CPAP   Type 2 diabetes mellitus with microalbuminuria, without long-term current use of insulin  (HCC) 06/24/2019    SURGICAL HISTORY: Past Surgical History:  Procedure Laterality Date   BREAST BIOPSY Left 12/14/2021   us  bx, venus marker, path pending   CESAREAN SECTION     COLONOSCOPY WITH PROPOFOL  N/A 02/09/2022   Procedure: COLONOSCOPY WITH PROPOFOL ;  Surgeon: Unk Corinn Skiff, MD;  Location[facility];  Service: Endoscopy;  Laterality: N/A;  sleep apnea   CYSTOSCOPY W/ RETROGRADES Left 11/08/2018    Procedure: CYSTOSCOPY WITH RETROGRADE PYELOGRAM;  Surgeon: Francisca Redell BROCKS, MD;  Location: [facility];  Service: Urology;  Laterality: Left;   CYSTOSCOPY/URETEROSCOPY/HOLMIUM LASER/STENT PLACEMENT Left 11/08/2018   Procedure: CYSTOSCOPY/URETEROSCOPY/HOLMIUM LASER/STENT PLACEMENT;  Surgeon: Francisca Redell BROCKS, MD;  Location: [facility];  Service: Urology;  Laterality: Left;   IR CV LINE INJECTION  06/25/2023   IR IMAGING GUIDED PORT INSERTION  05/11/2022   IR REMOVE CV FIBRIN SHEATH  06/27/2023   MOUTH SURGERY     wisdom teeth extraction   MOUTH SURGERY     teeth removal   POLYPECTOMY N/A 02/09/2022   Procedure: POLYPECTOMY;  Surgeon: Unk Corinn Skiff, MD;  Location: [facility];  Service: Endoscopy;  Laterality: N/A;   XI ROBOTIC ASSISTED LOWER ANTERIOR RESECTION N/A 04/21/2022   Procedure: XI ROBOTIC ASSISTED LOWER ANTERIOR RESECTION WITH COLOSTOMY, BILATERAL TAP BLOCK, ASSESSMENT OF TISSUE PERFUSSION WITH FIREFLY INJECTION;  Surgeon: Teresa Lonni HERO, MD;  Location: [facility];  Service: General;  Laterality: N/A;    SOCIAL HISTORY: Social History   Socioeconomic History   Marital status: Married    Spouse name: Morene Jama Pereyra   Number of children: 1   Years of education: 12   Highest education level: High school graduate  Occupational History   Occupation: unemployed    Comment: disabled  Tobacco Use   Smoking status: Some Days    Current packs/day: 0.25    Average packs/day: 0.3 packs/day for 39.6 years (9.9 ttl pk-yrs)    Types: Cigarettes    Start date: 05/13/1985    Passive exposure: Current   Smokeless tobacco: Never   Tobacco comments:    5-6 cigarettes weekly- 11/07/2022  Vaping Use   Vaping status: Former   Start date: 05/25/2018   Quit date: 09/24/2018  Substance and Sexual Activity   Alcohol use: Not Currently    Alcohol/week: 4.0 standard drinks of alcohol    Types: 4 Glasses of wine per week    Comment: quit ETOH in Feb. 2023   Drug use: Yes    Types:  Marijuana    Comment: 2 days ago   Sexual activity: Not Currently    Birth control/protection: Post-menopausal  Other Topics Concern   Not on file  Social History Narrative   Lives with husband and son    Social Drivers of Health   Financial Resource Strain: Medium Risk (09/04/2024)   Overall Financial Resource Strain (CARDIA)    Difficulty of Paying Living Expenses: Somewhat hard  Food Insecurity: Food Insecurity Present (11/07/2024)   Hunger Vital Sign    Worried About Running Out of Food in the Last Year: Sometimes true    Ran Out of Food in the Last Year: Sometimes true  Transportation Needs: No Transportation Needs (09/04/2024)   PRAPARE - Administrator, Civil Service (Medical): No    Lack of Transportation (Non-Medical): No  Physical Activity: Inactive (09/04/2024)   Exercise Vital Sign    Days of Exercise per Week: 0 days    Minutes of Exercise per Session: 0 min  Stress: Stress Concern Present (09/04/2024)   [employer] of Occupational Health - Occupational Stress Questionnaire    Feeling of Stress: To some extent  Social Connections: Moderately Integrated (09/04/2024)   Social Connection and Isolation Panel    Frequency of Communication with Friends and Family: More than three times a week    Frequency of Social Gatherings with Friends and Family: More than three times a week    Attends Religious Services: More than 4 times per year    Active Member of [employer] or Organizations: No    Attends Banker Meetings: Never    Marital Status: Married  Catering Manager Violence: Not At Risk (09/04/2024)   Humiliation, Afraid, Rape, and Kick questionnaire    Fear of Current or Ex-Partner: No    Emotionally Abused: No    Physically Abused: No    Sexually Abused: No    FAMILY HISTORY: Family History  Problem Relation Age of Onset   Depression Mother    Anxiety disorder Mother    Diabetes Mother    Hypertension Mother    Hyperlipidemia Mother  Cancer Mother    Uterine cancer Mother 64   Cervical cancer Mother 80   Luna cancer Father    Depression Brother    Anxiety disorder Brother    Cancer Maternal Aunt        unk types   Diabetes Mellitus II Maternal Grandmother    Hypercholesterolemia Maternal Grandmother    Breast cancer Maternal Grandmother    Cancer Paternal Grandmother    Diabetes Paternal Grandmother    Melanoma Paternal Grandmother    Stomach cancer Paternal Grandmother     ALLERGIES:  is allergic to metformin and related, nsaids, perphenazine, sulfa antibiotics, abilify [aripiprazole], and penicillins.  MEDICATIONS:  Current Outpatient Medications  Medication Sig Dispense Refill   acetaminophen  (TYLENOL ) 325 MG tablet Take 650 mg by mouth every 6 (six) hours as needed for headache (pain).     albuterol  (VENTOLIN  HFA) 108 (90 Base) MCG/ACT inhaler Inhale 2 puffs into the lungs every 6 (six) hours as needed for wheezing or shortness of breath. 1 each 5   amantadine  (SYMMETREL ) 100 MG capsule Take 100 mg by mouth 2 (two) times daily.     blood glucose meter kit and supplies Dispense based on patient and insurance preference. Use up to four times daily as directed. (FOR ICD-10 E10.9, E11.9). 1 each 0   budesonide -glycopyrrolate -formoterol  (BREZTRI  AEROSPHERE) 160-9-4.8 MCG/ACT AERO inhaler INHALE 2 PUFFS INTO THE LUNGS IN THE MORNING AND AT BEDTIME 10.7 g 0   clindamycin  (CLEOCIN ) 300 MG capsule Take 300 mg by mouth every 6 (six) hours.     clonazePAM (KLONOPIN) 0.5 MG tablet Take 1 mg by mouth 2 (two) times daily as needed.     gabapentin  (NEURONTIN ) 300 MG capsule Take 300 mg by mouth 2 (two) times daily.     levothyroxine  (SYNTHROID ) 75 MCG tablet Take 1 tablet (75 mcg total) by mouth daily before breakfast. 90 tablet 2   lidocaine -prilocaine  (EMLA ) cream Apply to affected area once 30 g 3   loperamide  (IMODIUM ) 2 MG capsule Take 1 capsule (2 mg total) by mouth See admin instructions. Initial: 4 mg,the 2 mg every  2 hours (4 mg every 4 hours at night)  maximum: 16 mg/day 120 capsule 2   loratadine  (CLARITIN ) 10 MG tablet Take 10 mg by mouth every morning.     nicotine  (NICODERM CQ  - DOSED IN MG/24 HOURS) 14 mg/24hr patch Place 1 patch (14 mg total) onto the skin daily. 30 patch 1   ondansetron  (ZOFRAN ) 8 MG tablet Take 1 tablet (8 mg total) by mouth every 8 (eight) hours as needed for nausea, vomiting or refractory nausea / vomiting. Start on the third day after chemotherapy. 30 tablet 1   paliperidone  (INVEGA  SUSTENNA) 234 MG/1.5ML SUSY injection Inject 234 mg into the muscle every 30 (thirty) days. On or about the 14th of each month     potassium chloride  SA (KLOR-CON  M) 20 MEQ tablet Take 1 tablet (20 mEq total) by mouth daily. 30 tablet 0   Spacer/Aero-Holding Chambers (AEROCHAMBER MV) inhaler Use as instructed 1 each 0   traMADol  (ULTRAM ) 50 MG tablet Take 1 tablet (50 mg total) by mouth every 8 (eight) hours as needed. 60 tablet 0   Vibegron  (GEMTESA ) 75 MG TABS Take 1 tablet (75 mg total) by mouth daily.     Cholecalciferol  (VITAMIN D -3) 125 MCG (5000 UT) TABS Take 5,000 Units by mouth daily. (Patient not taking: Reported on 12/03/2024) 30 tablet 1   diltiazem  (CARDIZEM  CD) 120 MG 24 hr  capsule TAKE 1 CAPSULE BY MOUTH DAILY (Patient not taking: Reported on 12/03/2024) 90 capsule 1   docusate sodium  (COLACE) 100 MG capsule TAKE 1 CAPSULE BY MOUTH TWICE DAILY (Patient not taking: Reported on 12/03/2024) 60 capsule 2   FARXIGA  10 MG TABS tablet TAKE 1 TABLET BY MOUTH DAILY BEFORE BREAKFAST (Patient not taking: Reported on 12/03/2024) 90 tablet 0   icosapent  Ethyl (VASCEPA ) 1 g capsule Take 2 capsules (2 g total) by mouth in the morning and at bedtime. (Patient not taking: Reported on 12/03/2024) 120 capsule 2   magic mouthwash w/lidocaine  SOLN Take 5 mLs by mouth 4 (four) times daily as needed for mouth pain. Sig: Swish/Swallow 5-10 ml four times a day as needed. Dispense 480 ml. 1RF (Patient not taking:  Reported on 12/03/2024) 480 mL 1   pantoprazole  (PROTONIX ) 40 MG tablet TAKE 1 TABLET BY MOUTH DAILY (Patient not taking: Reported on 12/03/2024) 90 tablet 2   pioglitazone  (ACTOS ) 15 MG tablet Take 1 tablet (15 mg total) by mouth daily. (Patient not taking: Reported on 12/03/2024) 90 tablet 1   predniSONE  (STERAPRED UNI-PAK 21 TAB) 10 MG (21) TBPK tablet Day 1 take 6 tablets, Day 2 take 5 tablets, Day 3 take 4 tablets, Day 4 take 3 tablets, Day 5 take 2 tablets D6 take 1 tablet (Patient not taking: Reported on 12/03/2024) 1 each 0   promethazine  (PHENERGAN ) 12.5 MG tablet Take 1 tablet (12.5 mg total) by mouth every 6 (six) hours as needed for nausea or vomiting. (Patient not taking: Reported on 12/03/2024) 30 tablet 0   rosuvastatin  (CRESTOR ) 40 MG tablet TAKE 1 TABLET BY MOUTH EVERY MORNING (Patient not taking: Reported on 12/03/2024) 90 tablet 0   No current facility-administered medications for this visit.   Facility-Administered Medications Ordered in Other Visits  Medication Dose Route Frequency Provider Last Rate Last Admin   albuterol  (PROVENTIL ) (2.5 MG/3ML) 0.083% nebulizer solution 2.5 mg  2.5 mg Nebulization Once Dgayli, Khabib, MD       sodium chloride  flush (NS) 0.9 % injection 10 mL  10 mL Intracatheter PRN Babara Call, MD   10 mL at 08/15/23 1327   sodium chloride  flush (NS) 0.9 % injection 10 mL  10 mL Intracatheter PRN Babara Call, MD   10 mL at 03/26/24 1255     PHYSICAL EXAMINATION: ECOG PERFORMANCE STATUS: 1 - Symptomatic but completely ambulatory Vitals:   12/03/24 1033 12/03/24 1041  BP: (!) 139/91 (!) 141/89  Pulse: 100   Resp: 18   Temp: (!) 96.3 F (35.7 C)   SpO2: 95%      Filed Weights   12/03/24 1033  Weight: 118 lb 1.6 oz (53.6 kg)      Physical Exam Constitutional:      General: She is not in acute distress. HENT:     Head: Normocephalic and atraumatic.  Eyes:     General: No scleral icterus. Cardiovascular:     Rate and Rhythm: Normal rate and  regular rhythm.  Pulmonary:     Effort: Pulmonary effort is normal. No respiratory distress.     Breath sounds: No wheezing.     Comments: Decreased breath sound bilaterally Abdominal:     General: Bowel sounds are normal. There is no distension.     Palpations: Abdomen is soft.     Comments: +colostomy   Musculoskeletal:        General: No deformity. Normal range of motion.     Cervical back: Normal range of motion  and neck supple.  Skin:    General: Skin is warm and dry.     Findings: No erythema or rash.  Neurological:     Mental Status: She is alert and oriented to person, place, and time. Mental status is at baseline.     Cranial Nerves: No cranial nerve deficit.     Coordination: Coordination normal.  Psychiatric:        Mood and Affect: Mood normal.     LABORATORY DATA:  I have reviewed the data as listed    Latest Ref Rng & Units 12/03/2024    9:45 AM 11/05/2024    9:00 AM 10/22/2024    8:21 AM  CBC  WBC 4.0 - 10.5 K/uL 10.0  4.6  7.0   Hemoglobin 12.0 - 15.0 g/dL 85.9  87.8  86.7   Hematocrit 36.0 - 46.0 % 42.4  36.7  39.7   Platelets 150 - 400 K/uL 142  163  130       Latest Ref Rng & Units 12/03/2024    9:45 AM 11/05/2024    9:00 AM 10/22/2024    8:21 AM  CMP  Glucose 70 - 99 mg/dL 884  881  867   BUN 6 - 20 mg/dL 11  18  11    Creatinine 0.44 - 1.00 mg/dL 9.07  9.11  9.05   Sodium 135 - 145 mmol/L 139  138  137   Potassium 3.5 - 5.1 mmol/L 4.3  3.9  4.2   Chloride 98 - 111 mmol/L 101  104  105   CO2 22 - 32 mmol/L 27  26  22    Calcium  8.9 - 10.3 mg/dL 9.6  8.7  8.7   Total Protein 6.5 - 8.1 g/dL 6.4  6.0  6.3   Total Bilirubin 0.0 - 1.2 mg/dL 0.3  0.4  0.5   Alkaline Phos 38 - 126 U/L 115  75  79   AST 15 - 41 U/L 21  22  27    ALT 0 - 44 U/L 16  16  19          RADIOGRAPHIC STUDIES: I have personally reviewed the radiological images as listed and agreed with the findings in the report. No results found.

## 2024-12-03 NOTE — Assessment & Plan Note (Addendum)
 History of Stage III. pT3 pN1b cM0, s/p APR. 05/2023 Stage IV current rectal adenocarcinoma Currently on adjuvant chemotherapy. S/p FOLFOX x 4 cycles S/p concurrent chemotherapy [Xeloda  1300 mg BID] with RT, and then another 4 cycles of adjuvant FOLFOX [dose reduced oxaliplatin  and omit 5-FU bolus 05/2023 CT imaging indicates disease progression. liver biopsy pathology showed metastatic carcinoma--> 06/2023 FOLFIRI + Bevacizumab --> 10/2023 and 02/2024 CT partial response --> 05/2024 CT stable - Shared decision was made to hold off irinotecan  given stable disease--> 08/2024 CT stable.  NGS showed TP53 missense variant, APC frameshift, TCF7L2 Frameshift, FLT3 copy number gain., TMB 4.7, pMMR Wildtype KRAS/NRAS  Labs are reviewed and discussed with patient. Recent Signatera circulating tumor DNA is positive.   Proceed with 5-Fu + Bevacizumab  CEA is rising Repeat CT.

## 2024-12-03 NOTE — Assessment & Plan Note (Signed)
 Recommend smoke cessation. .  Nicotin patch Rx was sent

## 2024-12-03 NOTE — Assessment & Plan Note (Signed)
 Grade 2  Continue gabapentin 300mg  BID.

## 2024-12-05 ENCOUNTER — Inpatient Hospital Stay

## 2024-12-05 VITALS — BP 134/92

## 2024-12-05 DIAGNOSIS — C20 Malignant neoplasm of rectum: Secondary | ICD-10-CM

## 2024-12-08 ENCOUNTER — Inpatient Hospital Stay

## 2024-12-08 ENCOUNTER — Inpatient Hospital Stay: Admitting: Oncology

## 2024-12-15 ENCOUNTER — Ambulatory Visit
Admission: RE | Admit: 2024-12-15 | Discharge: 2024-12-15 | Disposition: A | Discharge status: Home / Self Care | Source: Ambulatory Visit | Attending: Oncology | Admitting: Oncology

## 2024-12-15 ENCOUNTER — Inpatient Hospital Stay

## 2024-12-15 DIAGNOSIS — C20 Malignant neoplasm of rectum: Secondary | ICD-10-CM | POA: Insufficient documentation

## 2024-12-15 MED ORDER — IOHEXOL 300 MG/ML  SOLN
100.0000 mL | Freq: Once | INTRAMUSCULAR | Status: AC | PRN
  Administered 2024-12-15: 100 mL via INTRAVENOUS

## 2024-12-15 MED ORDER — HEPARIN SOD (PORK) LOCK FLUSH 100 UNIT/ML IV SOLN
INTRAVENOUS | Status: AC
  Filled 2024-12-15: qty 5

## 2024-12-15 MED ORDER — HEPARIN SOD (PORK) LOCK FLUSH 100 UNIT/ML IV SOLN
500.0000 [IU] | Freq: Once | INTRAVENOUS | Status: AC
  Administered 2024-12-15: 500 [IU] via INTRAVENOUS

## 2024-12-23 MED FILL — Fluorouracil IV Soln 2.5 GM/50ML (50 MG/ML): INTRAVENOUS | Qty: 12 | Status: AC

## 2024-12-23 MED FILL — Fluorouracil IV Soln 5 GM/100ML (50 MG/ML): INTRAVENOUS | Qty: 70 | Status: AC

## 2024-12-24 ENCOUNTER — Inpatient Hospital Stay

## 2024-12-24 ENCOUNTER — Inpatient Hospital Stay: Admitting: Oncology

## 2024-12-26 ENCOUNTER — Inpatient Hospital Stay

## 2024-12-30 ENCOUNTER — Telehealth: Payer: Self-pay | Admitting: Oncology

## 2024-12-30 DIAGNOSIS — C20 Malignant neoplasm of rectum: Secondary | ICD-10-CM

## 2024-12-30 NOTE — Telephone Encounter (Signed)
 Called pt and lvm to call back to r/s her missed tx appts. Scheduling phone number provided.  Appt dates in IS have been updated so that we can schedule them but pt did not answer the phone.

## 2025-01-07 ENCOUNTER — Other Ambulatory Visit: Payer: Self-pay | Admitting: Oncology

## 2025-01-07 ENCOUNTER — Telehealth: Payer: Self-pay | Admitting: Oncology

## 2025-01-07 DIAGNOSIS — C20 Malignant neoplasm of rectum: Secondary | ICD-10-CM

## 2025-01-07 NOTE — Telephone Encounter (Signed)
 Pt called back and was ready to schedule appt, (her phone just got turned back on and she had been sick). Appts scheduled for 01/13/2025.

## 2025-01-13 ENCOUNTER — Inpatient Hospital Stay

## 2025-01-13 ENCOUNTER — Inpatient Hospital Stay: Admitting: Oncology

## 2025-01-13 ENCOUNTER — Inpatient Hospital Stay: Attending: Oncology | Admitting: Hospice and Palliative Medicine

## 2025-01-13 ENCOUNTER — Encounter: Payer: Self-pay | Admitting: Oncology

## 2025-01-13 VITALS — BP 118/89 | HR 100 | Temp 98.0°F | Resp 19 | Wt 111.2 lb

## 2025-01-13 DIAGNOSIS — C20 Malignant neoplasm of rectum: Secondary | ICD-10-CM

## 2025-01-13 DIAGNOSIS — T451X5A Adverse effect of antineoplastic and immunosuppressive drugs, initial encounter: Secondary | ICD-10-CM | POA: Diagnosis not present

## 2025-01-13 DIAGNOSIS — Z720 Tobacco use: Secondary | ICD-10-CM

## 2025-01-13 DIAGNOSIS — G620 Drug-induced polyneuropathy: Secondary | ICD-10-CM

## 2025-01-13 DIAGNOSIS — J441 Chronic obstructive pulmonary disease with (acute) exacerbation: Secondary | ICD-10-CM

## 2025-01-13 LAB — CBC WITH DIFFERENTIAL (CANCER CENTER ONLY)
Abs Immature Granulocytes: 0.02 K/uL (ref 0.00–0.07)
Basophils Absolute: 0.1 K/uL (ref 0.0–0.1)
Basophils Relative: 1 %
Eosinophils Absolute: 0.1 K/uL (ref 0.0–0.5)
Eosinophils Relative: 1 %
HCT: 47.1 % — ABNORMAL HIGH (ref 36.0–46.0)
Hemoglobin: 15.1 g/dL — ABNORMAL HIGH (ref 12.0–15.0)
Immature Granulocytes: 0 %
Lymphocytes Relative: 9 %
Lymphs Abs: 0.7 K/uL (ref 0.7–4.0)
MCH: 32.8 pg (ref 26.0–34.0)
MCHC: 32.1 g/dL (ref 30.0–36.0)
MCV: 102.4 fL — ABNORMAL HIGH (ref 80.0–100.0)
Monocytes Absolute: 0.6 K/uL (ref 0.1–1.0)
Monocytes Relative: 8 %
Neutro Abs: 6.3 K/uL (ref 1.7–7.7)
Neutrophils Relative %: 81 %
Platelet Count: 162 K/uL (ref 150–400)
RBC: 4.6 MIL/uL (ref 3.87–5.11)
RDW: 15.1 % (ref 11.5–15.5)
WBC Count: 7.8 K/uL (ref 4.0–10.5)
nRBC: 0 % (ref 0.0–0.2)

## 2025-01-13 LAB — CMP (CANCER CENTER ONLY)
ALT: 10 U/L (ref 0–44)
AST: 18 U/L (ref 15–41)
Albumin: 4 g/dL (ref 3.5–5.0)
Alkaline Phosphatase: 95 U/L (ref 38–126)
Anion gap: 13 (ref 5–15)
BUN: 12 mg/dL (ref 6–20)
CO2: 25 mmol/L (ref 22–32)
Calcium: 9.6 mg/dL (ref 8.9–10.3)
Chloride: 101 mmol/L (ref 98–111)
Creatinine: 0.88 mg/dL (ref 0.44–1.00)
GFR, Estimated: >60 mL/min
Glucose, Bld: 217 mg/dL — ABNORMAL HIGH (ref 70–99)
Potassium: 4.1 mmol/L (ref 3.5–5.1)
Sodium: 138 mmol/L (ref 135–145)
Total Bilirubin: 0.3 mg/dL (ref 0.0–1.2)
Total Protein: 6.8 g/dL (ref 6.5–8.1)

## 2025-01-13 MED ORDER — LEVOFLOXACIN 500 MG PO TABS: 500.0000 mg | ORAL_TABLET | Freq: Every day | ORAL | 0 refills | Status: AC

## 2025-01-13 MED ORDER — LIDOCAINE-PRILOCAINE 2.5-2.5 % EX CREA: TOPICAL_CREAM | CUTANEOUS | 3 refills | Status: AC

## 2025-01-13 MED ORDER — CLOTRIMAZOLE 1 % VA CREA: 1.0000 | TOPICAL_CREAM | Freq: Every day | VAGINAL | 0 refills | Status: AC

## 2025-01-13 MED ORDER — PREDNISONE 10 MG (21) PO TBPK: ORAL_TABLET | ORAL | 0 refills | Status: AC

## 2025-01-13 NOTE — Progress Notes (Signed)
 " Hematology/Oncology Progress note [phone] [phone]      CHIEF COMPLAINTS/REASON FOR VISIT:  Follow-up for Stage IV  rectal cancer treatments.  ASSESSMENT & PLAN:   Cancer Staging  Rectal cancer [facility]) Staging form: Colon and Rectum, AJCC 8th Edition - Pathologic stage from 05/03/2022: Stage IIIB (pT3, pN1b, cM0) - Signed by Babara Call, MD on 05/03/2022 - Pathologic stage from 06/18/2023: Stage IVA (rpTX, rpN0, rpM1a) - Signed by Babara Call, MD on 06/18/2023   Rectal cancer [facility]) History of Stage III. pT3 pN1b cM0, s/p APR. 05/2023 Stage IV current rectal adenocarcinoma Currently on adjuvant chemotherapy. S/p FOLFOX x 4 cycles S/p concurrent chemotherapy [Xeloda  1300 mg BID] with RT, and then another 4 cycles of adjuvant FOLFOX [dose reduced oxaliplatin  and omit 5-FU bolus 05/2023 CT imaging indicates disease progression. liver biopsy pathology showed metastatic carcinoma--> 06/2023 FOLFIRI + Bevacizumab --> 10/2023 and 02/2024 CT partial response --> 05/2024 CT stable - Shared decision was made to hold off irinotecan  given stable disease--> 08/2024 CT stable.  NGS showed TP53 missense variant, APC frameshift, TCF7L2 Frameshift, FLT3 copy number gain., TMB 4.7, pMMR Wildtype KRAS/NRAS  Labs are reviewed and discussed with patient. Recent Signatera circulating tumor DNA is positive.  Recent CT scan showed disease progression. Recommend to switch to next line of chemotherapy with Irino rationale and tecan plus panitumumab.  Potential side effects reviewed and discussed with patient.  Plan to start in 2 weeks to allow her COPD exacerbation to improve.  Chemotherapy-induced neuropathy Grade 2  Continue gabapentin  300mg  BID.   Tobacco use Recommend smoke cessation. .  Nicotin patch Rx was sent    COPD exacerbation (HCC) Recommend patient to use her inhalers and stop smoking Recommend tapering course of prednisone .  Recommend 5 days course of Levaquin  500 milligram  daily to cover superimposed bronchitis.    Orders Placed This Encounter  Procedures   CBC with Differential (Cancer Center Only)    Standing Status:   Future    Expected Date:   01/27/2025    Expiration Date:   01/27/2026   CMP (Cancer Center only)    Standing Status:   Future    Expected Date:   01/27/2025    Expiration Date:   01/27/2026   Magnesium     Standing Status:   Future    Expected Date:   01/27/2025    Expiration Date:   01/27/2026    Follow-up 3 week(s)  All questions were answered. The patient knows to call the clinic with any problems, questions or concerns.  Call Babara, MD, PhD [facility] Hematology Oncology 01/13/2025     HISTORY OF PRESENTING ILLNESS:   April Luna is a  54 y.o.  female presents for treatment of rectal cancer Oncology History  Rectal cancer (HCC)  02/26/2022 Imaging   MRI PELVIS WITHOUT CONTRAST- By imaging, rectal cancer stage:  T1/T2, N0, Mx    03/02/2022 Imaging   CT CHEST AND ABDOMEN WITH CONTRAST 1. No convincing evidence of metastatic disease within the chest or abdomen. 2. Atrophic left kidney with multifocal renal scarring and cortical calcifications as well as nonobstructive renal stones measuring up to 5 mm. 3. Prominent left-sided predominant retroperitoneal lymph nodes measuring up to 8 mm near the level of the renal hilum, overall decreased in size dating back to CT September 19, 2018 and favored reactive related to left renal inflammation. 4.  Aortic Atherosclerosis (ICD10-I70.0).   03/27/2022 Genetic Testing    [facility] CustomNext+RNA cancer panel found no  pathogenic mutations.    04/21/2022 Initial Diagnosis   Rectal cancer - baseline CEA 3.6 -02/09/2022, patient had colonoscopy which showed renal mass 10 cm from anal verge.  5 mm polyp in ascending colon.  Removed and retrieved. Pathology showed rectal adenocarcinoma.  The polyp in the ascending colon is a tubular adenoma.  MRI showed cT1/T2N0 disease  -04/21/2022, patient  underwent robotic assisted ultralow anterior resection. Pathology showed moderately differentiated adenocarcinoma, 4.5 cm in maximal extent, with focal extension through muscularis propria into perirectal soft tissue.  3 lymph nodes positive for metastatic carcinoma.  Negative margin.  pT3 pN1b, MSI stable.   05/03/2022 Cancer Staging   Staging form: Colon and Rectum, AJCC 8th Edition - Pathologic stage from 05/03/2022: Stage IIIB (pT3, pN1b, cM0) - Signed by Babara Call, MD on 05/03/2022 Stage prefix: Initial diagnosis   05/11/2022 Miscellaneous   Medi port placed by Dr.White   05/26/2022 -  Chemotherapy   FOLFOX Q2 weeks x 4   05/26/2022 - 07/09/2022 Chemotherapy   Patient is on Treatment Plan : COLORECTAL FOLFOX q14d x 8 cycles     05/26/2022 - 12/13/2022 Chemotherapy   Patient is on Treatment Plan : COLORECTAL FOLFOX q14d x 4 months     07/27/2022 -  Chemotherapy   Concurrent chemotherapy- Xeloda   1300mg  BID and radiation.    07/27/2022 - 09/12/2022 Radiation Therapy    concurrent chemotherapy [Xeloda  1300 mg BID] with RT    09/20/2022 Imaging   CT Angiogram chest PET protocol There is no evidence of pulmonary artery embolism. There is no evidence of thoracic aortic dissection.   Small linear patchy alveolar infiltrate is seen in medial segment of right middle lobe suggesting atelectasis/pneumonia.     09/28/2022 - 09/30/2022 Hospital Admission   Admission due to acute respiratory failure with hypoxia, COPD exacerbation   09/28/2022 Imaging   CT angio chest PE protocol 1. Negative for acute PE or thoracic aortic dissection. 2. New ground-glass infiltrates anteriorly in bilateral upper lobes,may represent atypical edema, infectious or inflammatory process. 3.  Aortic Atherosclerosis     12/22/2022 Imaging   CT chest abdomen pelvis w contrast  Stable exam. No evidence of recurrent or metastatic carcinoma within the chest, abdomen, or pelvis.   Left nephrolithiasis and renal parenchymal  atrophy. No evidence of ureteral calculi or hydronephrosis.   Aortic Atherosclerosis   05/31/2023 Imaging   CT chest abdomen pelvis with contrast showed 1. New hypodense 16 mm lesion in the right lobe of the liver with very subtle adjacent 5 mm lesion, suspicious for metastatic disease. 2. Small bilateral pulmonary nodules, some of which were subtly evident on prior examination only in retrospect, some of which demonstrate degree of cavitation, suspicious for metastatic disease. 3. Prominent retroperitoneal lymph nodes are similar prior. 4. Prior low anterior resection with Hartmann's pouch formation,similar small amount of perirectal/presacral soft tissue and fluid,favored postsurgical/posttreatment change. Suggest continued attention on follow-up imaging. 5. Large volume of formed stool in the colon. 6. Nonobstructive left renal calculi measure under 1 cm.     06/08/2023 Relapse/Recurrence   Ultrasound-guided liver biopsy showed Pathology showed metastatic carcinoma, morphology consistent with patient's clinical history of rectal adenocarcinoma.  [employer] NGS 648 gene panel showed TP53 missense variant, APC frameshift, TCF7L2 frameshift, FLT3 copy number gain, TMB 4.23m/MB MSI stable.  Wildtype KRAS/NRAS  [employer] RNA Seq - ERBB3 overexpressed, VEGFA overexpressed, NRAS overexpressed.     06/18/2023 Cancer Staging   Staging form: Colon and Rectum, AJCC 8th Edition - Pathologic stage  from 06/18/2023: Stage IVA (rpTX, pN0, pM1a) - Signed by Babara Call, MD on 06/18/2023 Stage prefix: Recurrence Total positive nodes: 0   07/02/2023 - 06/04/2024 Chemotherapy   COLORECTAL FOLFIRI + Bevacizumab  q14d      11/13/2023 Imaging   CT chest abdomen pelvis w contrast showed  1. Increase in cavitation with decreased soft tissue involving the scattered bilateral pulmonary nodules, consistent with treatment response. No new suspicious pulmonary nodules or masses. 2. Decrease in size and conspicuity of the  hypodense lesions in the right lobe of the liver, consistent with treatment response. No new suspicious hepatic lesion identified. 3. Stable prominent nonspecific retroperitoneal lymph nodes. Suggest continued attention on follow-up imaging. 4. Similar surgical changes of prior low anterior resection with left anterior abdominal wall colostomy. Similar mesorectal/presacral soft tissue, no new suspicious enhancing nodularity. 5. Similar soft tissue nodularity in the bilateral posterior gluteal subcutaneous soft tissues. 6. Moderate volume of formed stool in the colon. Correlate for constipation.   03/03/2024 Imaging   CT chest abdomen pelvis w contrast showed 1. 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 2. No new or progressive disease. 3. Similar prominent but not pathologically sized abdominal retroperitoneal nodes, nonspecific. 4. Status post low anterior resection with descending colostomy. Possible constipation. 5. Age advanced coronary artery atherosclerosis. Recommend assessment of coronary risk factors. 6.  Aortic Atherosclerosis (ICD10-I70.0). 7. Left nephrolithiasis and renal scarring.    05/27/2024 Imaging   CT chest abdomen pelvis w contrast showed 1. Status post abdominoperineal resection with left lower quadrant end colostomy. 2. Unchanged enlarged retroperitoneal lymph nodes. 3. Unchanged small hypodense lesion of the posterior right lobe of the liver. 4. Very tiny, irregular cavitary nodules are unchanged. 5. New areas of irregular atelectasis or consolidation of the peripheral right lower lobe, nonspecific and most likely infectious or inflammatory. Metastatic disease distinctly not favored. Attention on follow-up. 6. Atrophic, scarred left kidney with nonobstructive calculi of the inferior pole. No hydronephrosis.   Aortic Atherosclerosis (ICD10-I70.0).     06/18/2024 -  Chemotherapy   5-FU and Bevacizumab  maintenance.    09/01/2024  Imaging   CT chest abdomen pelvis w contrast showed 1. Status post abdominoperineal resection with left lower quadrant end colostomy. No evidence of local recurrence. 2. Stable mildly enlarged/prominent retroperitoneal lymph nodes. 3. Stable tiny irregular cavitary pulmonary nodules. 4. Unchanged small hypodense lesion in the posterior right lobe of the liver. 5. No evidence of new or progressive disease in the chest, abdomen or pelvis. 6. Aortic atherosclerosis.   Aortic Atherosclerosis (ICD10-I70.0).     01/27/2025 -  Chemotherapy   Patient is on Treatment Plan : COLORECTAL Irinotecan  + Panitumumab q14d     Patient has bipolar and schizophrenia..  Patient is married  She has housing issue due to previous history of eviction.  she does not live with her husband currently. She lives with her son.   + chronic dysuria and urgency she was seen by Urology and was started on Gemteza.   She has no new complains.     INTERVAL HISTORY April Luna is a 54 y.o. female who has above history reviewed by me today presents for follow up visit for rectal cancer.  Discussed the use of AI scribe software for clinical note transcription with the patient, who gave verbal consent to proceed.  Patient no-showed  to her recent chemotherapy appointment. Since approximately December 30th, she developed persistent upper respiratory symptoms including productive cough with off-white sputum, wheezing, congestion, and dyspnea. The cough  has become increasingly productive and is associated with nocturnal episodes that are difficult to control. She also reports intermittent dizziness. She denies nausea, vomiting, or diarrhea. Appetite is decreased and she has experienced weight loss, but she is able to maintain oral intake. She did not seek care for these symptoms prior to this visit due to lack of phone access.  She continues to take her thyroid  medication as prescribed and receives regular Invega  Sustenna  injections for schizoaffective disorder. She continues to smoke, though less than previously. She lives with her husband and utilizes food pantries for food security.  Review of Systems  Constitutional:  Positive for fatigue. Negative for appetite change, chills and fever.  HENT:   Negative for hearing loss, mouth sores and voice change.   Eyes:  Negative for eye problems.  Respiratory:  Positive for cough and wheezing. Negative for chest tightness.   Cardiovascular:  Negative for chest pain.  Gastrointestinal:  Negative for abdominal distention, abdominal pain, blood in stool, diarrhea and nausea.       + colostomy   Endocrine: Negative for hot flashes.  Genitourinary:  Negative for difficulty urinating, dysuria and frequency.   Musculoskeletal:  Positive for arthralgias.  Skin:  Negative for itching and rash.  Neurological:  Positive for numbness. Negative for extremity weakness.  Hematological:  Negative for adenopathy.  Psychiatric/Behavioral:  Negative for confusion.     MEDICAL HISTORY:  Past Medical History:  Diagnosis Date   Allergy    pollen   Anxiety    Arthritis    right hip   Bipolar 1 disorder (HCC)    Cancer (HCC)    rectal   Chemotherapy-induced neuropathy 10/24/2022   Chronic kidney disease    COPD (chronic obstructive pulmonary disease) (HCC)    Depression    Family history of adverse reaction to anesthesia    grand father had a stroke during anesthesia   Family history of breast cancer    Family history of colon cancer    Family history of uterine cancer    GERD (gastroesophageal reflux disease)    History of kidney stones    Hyperlipidemia    Hypertension    Hypothyroidism    Panic attack    Pneumonia    Psoriasis    Sleep apnea 08/11/2021   No CPAP   Type 2 diabetes mellitus with microalbuminuria, without long-term current use of insulin  (HCC) 06/24/2019    SURGICAL HISTORY: Past Surgical History:  Procedure Laterality Date   BREAST BIOPSY  Left 12/14/2021   us  bx, venus marker, path pending   CESAREAN SECTION     COLONOSCOPY WITH PROPOFOL  N/A 02/09/2022   Procedure: COLONOSCOPY WITH PROPOFOL ;  Surgeon: Unk Corinn Skiff, MD;  Location: [facility];  Service: Endoscopy;  Laterality: N/A;  sleep apnea   CYSTOSCOPY W/ RETROGRADES Left 11/08/2018   Procedure: CYSTOSCOPY WITH RETROGRADE PYELOGRAM;  Surgeon: Francisca Redell BROCKS, MD;  Location: [facility];  Service: Urology;  Laterality: Left;   CYSTOSCOPY/URETEROSCOPY/HOLMIUM LASER/STENT PLACEMENT Left 11/08/2018   Procedure: CYSTOSCOPY/URETEROSCOPY/HOLMIUM LASER/STENT PLACEMENT;  Surgeon: Francisca Redell BROCKS, MD;  Location: [facility];  Service: Urology;  Laterality: Left;   IR CV LINE INJECTION  06/25/2023   IR IMAGING GUIDED PORT INSERTION  05/11/2022   IR REMOVE CV FIBRIN SHEATH  06/27/2023   MOUTH SURGERY     wisdom teeth extraction   MOUTH SURGERY     teeth removal   POLYPECTOMY N/A 02/09/2022   Procedure: POLYPECTOMY;  Surgeon: Unk Corinn Skiff,  MD;  Location: [facility];  Service: Endoscopy;  Laterality: N/A;   XI ROBOTIC ASSISTED LOWER ANTERIOR RESECTION N/A 04/21/2022   Procedure: XI ROBOTIC ASSISTED LOWER ANTERIOR RESECTION WITH COLOSTOMY, BILATERAL TAP BLOCK, ASSESSMENT OF TISSUE PERFUSSION WITH FIREFLY INJECTION;  Surgeon: Teresa Lonni HERO, MD;  Location: [facility];  Service: General;  Laterality: N/A;    SOCIAL HISTORY: Social History   Socioeconomic History   Marital status: Married    Spouse name: Morene Jama Pereyra   Number of children: 1   Years of education: 12   Highest education level: High school graduate  Occupational History   Occupation: unemployed    Comment: disabled  Tobacco Use   Smoking status: Some Days    Current packs/day: 0.25    Average packs/day: 0.3 packs/day for 39.7 years (9.9 ttl pk-yrs)    Types: Cigarettes    Start date: 05/13/1985    Passive exposure: Current   Smokeless tobacco: Never   Tobacco comments:    5-6  cigarettes weekly- 11/07/2022  Vaping Use   Vaping status: Former   Start date: 05/25/2018   Quit date: 09/24/2018  Substance and Sexual Activity   Alcohol use: Not Currently    Alcohol/week: 4.0 standard drinks of alcohol    Types: 4 Glasses of wine per week    Comment: quit ETOH in Feb. 2023   Drug use: Yes    Types: Marijuana    Comment: 2 days ago   Sexual activity: Not Currently    Birth control/protection: Post-menopausal  Other Topics Concern   Not on file  Social History Narrative   Lives with husband and son    Social Drivers of Health   Tobacco Use: High Risk (01/13/2025)   Patient History    Smoking Tobacco Use: Some Days    Smokeless Tobacco Use: Never    Passive Exposure: Current  Financial Resource Strain: Medium Risk (09/04/2024)   Overall Financial Resource Strain (CARDIA)    Difficulty of Paying Living Expenses: Somewhat hard  Food Insecurity: Food Insecurity Present (11/07/2024)   Epic    Worried About Programme Researcher, Broadcasting/film/video in the Last Year: Sometimes true    [employer] of Food in the Last Year: Sometimes true  Transportation Needs: No Transportation Needs (09/04/2024)   Epic    Lack of Transportation (Medical): No    Lack of Transportation (Non-Medical): No  Physical Activity: Inactive (09/04/2024)   Exercise Vital Sign    Days of Exercise per Week: 0 days    Minutes of Exercise per Session: 0 min  Stress: Stress Concern Present (09/04/2024)   [employer] of Occupational Health - Occupational Stress Questionnaire    Feeling of Stress: To some extent  Social Connections: Moderately Integrated (09/04/2024)   Social Connection and Isolation Panel    Frequency of Communication with Friends and Family: More than three times a week    Frequency of Social Gatherings with Friends and Family: More than three times a week    Attends Religious Services: More than 4 times per year    Active Member of [employer] or Organizations: No    Attends Banker  Meetings: Never    Marital Status: Married  Catering Manager Violence: Not At Risk (09/04/2024)   Epic    Fear of Current or Ex-Partner: No    Emotionally Abused: No    Physically Abused: No    Sexually Abused: No  Depression (PHQ2-9): Low Risk (12/05/2024)   Depression (PHQ2-9)  PHQ-2 Score: 0  Alcohol Screen: Low Risk (09/04/2024)   Alcohol Screen    Last Alcohol Screening Score (AUDIT): 0  Housing: High Risk (09/04/2024)   Epic    Unable to Pay for Housing in the Last Year: Yes    Number of Times Moved in the Last Year: Not on file    Homeless in the Last Year: No  Utilities: Not At Risk (09/04/2024)   Epic    Threatened with loss of utilities: No  Health Literacy: Adequate Health Literacy (09/04/2024)   B1300 Health Literacy    Frequency of need for help with medical instructions: Never    FAMILY HISTORY: Family History  Problem Relation Age of Onset   Depression Mother    Anxiety disorder Mother    Diabetes Mother    Hypertension Mother    Hyperlipidemia Mother    Cancer Mother    Uterine cancer Mother 54   Cervical cancer Mother 27   Colon cancer Father    Depression Brother    Anxiety disorder Brother    Cancer Maternal Aunt        unk types   Diabetes Mellitus II Maternal Grandmother    Hypercholesterolemia Maternal Grandmother    Breast cancer Maternal Grandmother    Cancer Paternal Grandmother    Diabetes Paternal Grandmother    Melanoma Paternal Grandmother    Stomach cancer Paternal Grandmother     ALLERGIES:  is allergic to metformin and related, nsaids, perphenazine, sulfa antibiotics, abilify [aripiprazole], and penicillins.  MEDICATIONS:  Current Outpatient Medications  Medication Sig Dispense Refill   acetaminophen  (TYLENOL ) 325 MG tablet Take 650 mg by mouth every 6 (six) hours as needed for headache (pain).     albuterol  (VENTOLIN  HFA) 108 (90 Base) MCG/ACT inhaler Inhale 2 puffs into the lungs every 6 (six) hours as needed for wheezing or  shortness of breath. 1 each 5   amantadine  (SYMMETREL ) 100 MG capsule Take 100 mg by mouth 2 (two) times daily.     blood glucose meter kit and supplies Dispense based on patient and insurance preference. Use up to four times daily as directed. (FOR ICD-10 E10.9, E11.9). 1 each 0   budesonide -glycopyrrolate -formoterol  (BREZTRI  AEROSPHERE) 160-9-4.8 MCG/ACT AERO inhaler INHALE 2 PUFFS INTO THE LUNGS IN THE MORNING AND AT BEDTIME 10.7 g 0   clindamycin  (CLEOCIN ) 300 MG capsule Take 300 mg by mouth every 6 (six) hours.     clonazePAM (KLONOPIN) 0.5 MG tablet Take 1 mg by mouth 2 (two) times daily as needed.     clotrimazole  (GYNE-LOTRIMIN ) 1 % vaginal cream Place 1 Applicatorful vaginally at bedtime for 7 days. 45 g 0   gabapentin  (NEURONTIN ) 300 MG capsule Take 300 mg by mouth 2 (two) times daily.     levofloxacin  (LEVAQUIN ) 500 MG tablet Take 1 tablet (500 mg total) by mouth daily. 5 tablet 0   levothyroxine  (SYNTHROID ) 75 MCG tablet Take 1 tablet (75 mcg total) by mouth daily before breakfast. 90 tablet 2   loperamide  (IMODIUM ) 2 MG capsule Take 1 capsule (2 mg total) by mouth See admin instructions. Initial: 4 mg,the 2 mg every 2 hours (4 mg every 4 hours at night)  maximum: 16 mg/day 120 capsule 2   loratadine  (CLARITIN ) 10 MG tablet Take 10 mg by mouth every morning.     magnesium  gluconate (MAGONATE) 500 (27 Mg) MG TABS tablet Take 500 mg by mouth in the morning and at bedtime.     nicotine  (NICODERM CQ  -  DOSED IN MG/24 HOURS) 14 mg/24hr patch Place 1 patch (14 mg total) onto the skin daily. 30 patch 1   paliperidone  (INVEGA  SUSTENNA) 234 MG/1.5ML SUSY injection Inject 234 mg into the muscle every 30 (thirty) days. On or about the 14th of each month     potassium chloride  SA (KLOR-CON  M) 20 MEQ tablet Take 1 tablet (20 mEq total) by mouth daily. 30 tablet 0   Spacer/Aero-Holding Chambers (AEROCHAMBER MV) inhaler Use as instructed 1 each 0   traMADol  (ULTRAM ) 50 MG tablet Take 1 tablet (50 mg  total) by mouth every 8 (eight) hours as needed. 60 tablet 0   Vibegron  (GEMTESA ) 75 MG TABS Take 1 tablet (75 mg total) by mouth daily.     Cholecalciferol  (VITAMIN D -3) 125 MCG (5000 UT) TABS Take 5,000 Units by mouth daily. (Patient not taking: Reported on 01/13/2025) 30 tablet 1   diltiazem  (CARDIZEM  CD) 120 MG 24 hr capsule TAKE 1 CAPSULE BY MOUTH DAILY (Patient not taking: Reported on 01/13/2025) 90 capsule 1   docusate sodium  (COLACE) 100 MG capsule TAKE 1 CAPSULE BY MOUTH TWICE DAILY (Patient not taking: Reported on 01/13/2025) 60 capsule 2   FARXIGA  10 MG TABS tablet TAKE 1 TABLET BY MOUTH DAILY BEFORE BREAKFAST (Patient not taking: Reported on 01/13/2025) 90 tablet 0   icosapent  Ethyl (VASCEPA ) 1 g capsule Take 2 capsules (2 g total) by mouth in the morning and at bedtime. (Patient not taking: Reported on 01/13/2025) 120 capsule 2   lidocaine -prilocaine  (EMLA ) cream Apply to affected area once 30 g 3   magic mouthwash w/lidocaine  SOLN Take 5 mLs by mouth 4 (four) times daily as needed for mouth pain. Sig: Swish/Swallow 5-10 ml four times a day as needed. Dispense 480 ml. 1RF (Patient not taking: Reported on 01/13/2025) 480 mL 1   pantoprazole  (PROTONIX ) 40 MG tablet TAKE 1 TABLET BY MOUTH DAILY (Patient not taking: Reported on 01/13/2025) 90 tablet 2   pioglitazone  (ACTOS ) 15 MG tablet Take 1 tablet (15 mg total) by mouth daily. (Patient not taking: Reported on 01/13/2025) 90 tablet 1   predniSONE  (STERAPRED UNI-PAK 21 TAB) 10 MG (21) TBPK tablet Day 1 take 6 tablets, Day 2 take 5 tablets, Day 3 take 4 tablets, Day 4 take 3 tablets, Day 5 take 2 tablets D6 take 1 tablet 1 each 0   promethazine  (PHENERGAN ) 12.5 MG tablet Take 1 tablet (12.5 mg total) by mouth every 6 (six) hours as needed for nausea or vomiting. (Patient not taking: Reported on 01/13/2025) 30 tablet 0   rosuvastatin  (CRESTOR ) 40 MG tablet TAKE 1 TABLET BY MOUTH EVERY MORNING (Patient not taking: Reported on 01/13/2025) 90 tablet 0   No  current facility-administered medications for this visit.   Facility-Administered Medications Ordered in Other Visits  Medication Dose Route Frequency Provider Last Rate Last Admin   albuterol  (PROVENTIL ) (2.5 MG/3ML) 0.083% nebulizer solution 2.5 mg  2.5 mg Nebulization Once Dgayli, Khabib, MD         PHYSICAL EXAMINATION: ECOG PERFORMANCE STATUS: 1 - Symptomatic but completely ambulatory Vitals:   01/13/25 0852  BP: 118/89  Pulse: 100  Resp: 19  Temp: 98 F (36.7 C)  SpO2: 97%     Filed Weights   01/13/25 0852  Weight: 111 lb 3.2 oz (50.4 kg)      Physical Exam Constitutional:      General: She is not in acute distress. HENT:     Head: Normocephalic and atraumatic.  Eyes:  General: No scleral icterus. Cardiovascular:     Rate and Rhythm: Normal rate and regular rhythm.  Pulmonary:     Effort: Pulmonary effort is normal. No respiratory distress.     Breath sounds: No wheezing.     Comments: Decreased breath sound bilaterally Abdominal:     General: Bowel sounds are normal. There is no distension.     Palpations: Abdomen is soft.     Comments: +colostomy   Musculoskeletal:        General: No deformity. Normal range of motion.     Cervical back: Normal range of motion and neck supple.  Skin:    General: Skin is warm and dry.     Findings: No erythema or rash.  Neurological:     Mental Status: She is alert and oriented to person, place, and time. Mental status is at baseline.     Cranial Nerves: No cranial nerve deficit.     Coordination: Coordination normal.  Psychiatric:        Mood and Affect: Mood normal.     LABORATORY DATA:  I have reviewed the data as listed    Latest Ref Rng & Units 01/13/2025    8:31 AM 12/03/2024    9:45 AM 11/05/2024    9:00 AM  CBC  WBC 4.0 - 10.5 K/uL 7.8  10.0  4.6   Hemoglobin 12.0 - 15.0 g/dL 84.8  85.9  87.8   Hematocrit 36.0 - 46.0 % 47.1  42.4  36.7   Platelets 150 - 400 K/uL 162  142  163       Latest Ref  Rng & Units 01/13/2025    8:31 AM 12/03/2024    9:45 AM 11/05/2024    9:00 AM  CMP  Glucose 70 - 99 mg/dL 782  884  881   BUN 6 - 20 mg/dL 12  11  18    Creatinine 0.44 - 1.00 mg/dL 9.11  9.07  9.11   Sodium 135 - 145 mmol/L 138  139  138   Potassium 3.5 - 5.1 mmol/L 4.1  4.3  3.9   Chloride 98 - 111 mmol/L 101  101  104   CO2 22 - 32 mmol/L 25  27  26    Calcium  8.9 - 10.3 mg/dL 9.6  9.6  8.7   Total Protein 6.5 - 8.1 g/dL 6.8  6.4  6.0   Total Bilirubin 0.0 - 1.2 mg/dL 0.3  0.3  0.4   Alkaline Phos 38 - 126 U/L 95  115  75   AST 15 - 41 U/L 18  21  22    ALT 0 - 44 U/L 10  16  16          RADIOGRAPHIC STUDIES: I have personally reviewed the radiological images as listed and agreed with the findings in the report. CT CHEST ABDOMEN PELVIS W CONTRAST Result Date: 12/17/2024 CLINICAL DATA:  Rectal cancer restaging * Tracking Code: BO * EXAM: CT CHEST, ABDOMEN, AND PELVIS WITH CONTRAST TECHNIQUE: Multidetector CT imaging of the chest, abdomen and pelvis was performed following the standard protocol during bolus administration of intravenous contrast. RADIATION DOSE REDUCTION: This exam was performed according to the departmental dose-optimization program which includes automated exposure control, adjustment of the mA and/or kV according to patient size and/or use of iterative reconstruction technique. CONTRAST:  OMNIPAQUE  IOHEXOL  300 MG/ML SOLN additional oral enteric contrast COMPARISON:  09/01/2024 FINDINGS: CT CHEST FINDINGS Cardiovascular: Right chest port catheter. Aortic atherosclerosis. Normal heart size. No  pericardial effusion. Mediastinum/Nodes: No enlarged mediastinal, hilar, or axillary lymph nodes. Thyroid  gland, trachea, and esophagus demonstrate no significant findings. Lungs/Pleura: Diffuse bilateral bronchial wall thickening. Increased size and conspicuity of small spiculated and cavitary nodules in the left lower lobe, largest medially measuring 0.8 x 0.6 cm, previously no  greater than 0.5 cm (series 4, image 86, 99). No pleural effusion or pneumothorax. Musculoskeletal: No chest wall abnormality. No acute osseous findings. CT ABDOMEN PELVIS FINDINGS Hepatobiliary: Unchanged small hypodense lesion of the posterior right lobe of the liver, hepatic segment VI/VII measuring 0.8 cm (series 2, image 49). Contracted gallbladder. No gallstones, gallbladder wall thickening, or biliary dilatation. Pancreas: Unremarkable. No pancreatic ductal dilatation or surrounding inflammatory changes. Spleen: Normal in size without significant abnormality. Adrenals/Urinary Tract: Adrenal glands are unremarkable. Atrophic left kidney with multiple small nonobstructive calculi and severe multifocal cortical scarring. Right kidney is normal in size without calculi or hydronephrosis. Simple, benign renal cortical cysts requiring no specific further follow-up or characterization. Bladder is unremarkable. Stomach/Bowel: Stomach is within normal limits. Appendix appears normal. Status post abdominoperineal resection with unchanged postoperative appearance of the low pelvis. Left lower quadrant end colostomy. No evidence of bowel wall thickening, distention, or inflammatory changes. Vascular/Lymphatic: Aortic atherosclerosis. Similar enlarged retroperitoneal lymph nodes, index left retroperitoneal node measuring 1.3 x 1.1 cm (series 2, image 62). Reproductive: No mass or other abnormality. Other: No abdominal wall hernia or abnormality. No ascites. Musculoskeletal: No acute osseous findings. IMPRESSION: 1. Increased size and conspicuity of small spiculated and cavitary nodules in the left lower lobe, largest medially measuring 0.8 x 0.6 cm, previously no greater than 0.5 cm. Findings are concerning for progression of small pulmonary metastases. 2. Unchanged small hypodense lesion of the posterior right lobe of the liver, hepatic segment VI/VII measuring 0.8 cm. 3. Similar enlarged retroperitoneal lymph nodes,  index left retroperitoneal node measuring 1.3 x 1.1 cm. 4. Status post abdominoperineal resection with unchanged postoperative appearance of the low pelvis. Left lower quadrant end colostomy. 5. Atrophic left kidney with multiple small nonobstructive calculi and severe multifocal cortical scarring. No hydronephrosis. Aortic Atherosclerosis (ICD10-I70.0). Electronically Signed   By: Marolyn JONETTA Jaksch M.D.   On: 12/17/2024 15:20        "

## 2025-01-13 NOTE — Assessment & Plan Note (Signed)
 Grade 2  Continue gabapentin 300mg  BID.

## 2025-01-13 NOTE — Assessment & Plan Note (Addendum)
 History of Stage III. pT3 pN1b cM0, s/p APR. 05/2023 Stage IV current rectal adenocarcinoma Currently on adjuvant chemotherapy. S/p FOLFOX x 4 cycles S/p concurrent chemotherapy [Xeloda  1300 mg BID] with RT, and then another 4 cycles of adjuvant FOLFOX [dose reduced oxaliplatin  and omit 5-FU bolus 05/2023 CT imaging indicates disease progression. liver biopsy pathology showed metastatic carcinoma--> 06/2023 FOLFIRI + Bevacizumab --> 10/2023 and 02/2024 CT partial response --> 05/2024 CT stable - Shared decision was made to hold off irinotecan  given stable disease--> 08/2024 CT stable.  NGS showed TP53 missense variant, APC frameshift, TCF7L2 Frameshift, FLT3 copy number gain., TMB 4.7, pMMR Wildtype KRAS/NRAS  Labs are reviewed and discussed with patient. Recent Signatera circulating tumor DNA is positive.  Recent CT scan showed disease progression. Recommend to switch to next line of chemotherapy with Irino rationale and tecan plus panitumumab.  Potential side effects reviewed and discussed with patient.  Plan to start in 2 weeks to allow her COPD exacerbation to improve.

## 2025-01-13 NOTE — Assessment & Plan Note (Signed)
 Recommend patient to use her inhalers and stop smoking Recommend tapering course of prednisone .  Recommend 5 days course of Levaquin  500 milligram daily to cover superimposed bronchitis.

## 2025-01-13 NOTE — Progress Notes (Signed)
 Did not reach patient. Unable to leave VM. Will reschedule.

## 2025-01-13 NOTE — Assessment & Plan Note (Signed)
 Recommend smoke cessation. .  Nicotin patch Rx was sent

## 2025-01-13 NOTE — Progress Notes (Signed)
 DISCONTINUE ON PATHWAY REGIMEN - Colorectal     A cycle is every 14 days:     Bevacizumab -xxxx      Irinotecan       Leucovorin       Fluorouracil       Fluorouracil    **Always confirm dose/schedule in your pharmacy ordering system**  PRIOR TREATMENT: [id number]: FOLFIRI + Bevacizumab  q14 Days  START ON PATHWAY REGIMEN - Colorectal     A cycle is every 14 days:     Panitumumab      Irinotecan    **Always confirm dose/schedule in your pharmacy ordering system**  Patient Characteristics: Distant Metastases, Nonsurgical Candidate, KRAS/NRAS Wild-Type (BRAF V600 Wild-Type/Unknown), Standard Cytotoxic Therapy, Third Line Standard Cytotoxic Therapy, No Prior Anti-EGFR Therapy Therapeutic Status: Distant Metastases Tumor Location: Rectal BRAF Mutation Status: Wild-Type (no mutation) KRAS/NRAS Mutation Status: Wild-Type (no mutation) Microsatellite/Mismatch Repair Status: MSS/pMMR Preferred Therapy Approach: Standard Cytotoxic Therapy Standard Cytotoxic Line of Therapy: Third Careers Adviser Cytotoxic Therapy Intent of Therapy: Non-Curative / Palliative Intent, Discussed with Patient

## 2025-01-14 ENCOUNTER — Telehealth: Payer: Self-pay | Admitting: Oncology

## 2025-01-14 ENCOUNTER — Encounter: Payer: Self-pay | Admitting: Oncology

## 2025-01-14 LAB — CEA: CEA: 4.5 ng/mL (ref 0.0–4.7)

## 2025-01-14 NOTE — Telephone Encounter (Signed)
 Called pt to notify of appts made (via IS) - pt made aware - appt reminder sent out via mail per pt request - [facility]

## 2025-01-15 ENCOUNTER — Inpatient Hospital Stay

## 2025-01-16 ENCOUNTER — Encounter: Payer: Self-pay | Admitting: Oncology

## 2025-01-16 ENCOUNTER — Encounter: Payer: Self-pay | Admitting: Nurse Practitioner

## 2025-01-17 ENCOUNTER — Other Ambulatory Visit: Payer: Self-pay | Admitting: Family Medicine

## 2025-01-17 DIAGNOSIS — E1169 Type 2 diabetes mellitus with other specified complication: Secondary | ICD-10-CM

## 2025-01-19 NOTE — Telephone Encounter (Signed)
 Requested Prescriptions  Pending Prescriptions Disp Refills   rosuvastatin  (CRESTOR ) 40 MG tablet [Pharmacy Med Name: ROSUVASTATIN  CALCIUM  40 MG TAB] 90 tablet 0    Sig: TAKE 1 TABLET BY MOUTH EVERY MORNING     Cardiovascular:  Antilipid - Statins 2 Failed - 01/19/2025 10:52 AM      Failed - Lipid Panel in normal range within the last 12 months    Cholesterol  Date Value Ref Range Status  06/04/2023 204 (H) <200 mg/dL Final  98/84/7983 803 0 - 200 mg/dL Final   Ldl Cholesterol, Calc  Date Value Ref Range Status  01/08/2015 96 0 - 100 mg/dL Final   LDL Cholesterol (Calc)  Date Value Ref Range Status  06/04/2023 114 (H) mg/dL (calc) Final    Comment:    Reference range: <100 . Desirable range <100 mg/dL for primary prevention;   <70 mg/dL for patients with CHD or diabetic patients  with > or = 2 CHD risk factors. SABRA LDL-C is now calculated using the Martin-Hopkins  calculation, which is a validated novel method providing  better accuracy than the Friedewald equation in the  estimation of LDL-C.  Gladis APPLETHWAITE et al. SANDREA. 7986;689(80): 2061-2068  (http://education.[employer].com/faq/FAQ164)    HDL Cholesterol  Date Value Ref Range Status  01/08/2015 58 40 - 60 mg/dL Final   HDL  Date Value Ref Range Status  06/04/2023 45 (L) > OR = 50 mg/dL Final   Triglycerides  Date Value Ref Range Status  06/04/2023 339 (H) <150 mg/dL Final    Comment:    . If a non-fasting specimen was collected, consider repeat triglyceride testing on a fasting specimen if clinically indicated.  Veatrice et al. J. of Clin. Lipidol. 2015;9:129-169. SABRA   01/08/2015 210 (H) 0 - 200 mg/dL Final         Passed - Cr in normal range and within 360 days    Creatinine  Date Value Ref Range Status  01/13/2025 0.88 0.44 - 1.00 mg/dL Final   Creat  Date Value Ref Range Status  10/05/2022 1.08 (H) 0.50 - 0.99 mg/dL Final   Creatinine, Urine  Date Value Ref Range Status  06/04/2023 39 20 - 275  mg/dL Final         Passed - Patient is not pregnant      Passed - Valid encounter within last 12 months    Recent Outpatient Visits           10 months ago URI, acute   [facility] Tyndall, Dorette, MD   11 months ago Type 2 diabetes mellitus with stage 3a chronic kidney disease, without long-term current use of insulin  [facility])   [facility] Sowles, Krichna, MD

## 2025-01-27 ENCOUNTER — Inpatient Hospital Stay

## 2025-01-27 ENCOUNTER — Encounter: Payer: Self-pay | Admitting: Oncology

## 2025-01-27 ENCOUNTER — Inpatient Hospital Stay: Attending: Oncology

## 2025-01-27 ENCOUNTER — Inpatient Hospital Stay: Attending: Oncology | Admitting: Oncology

## 2025-01-27 ENCOUNTER — Other Ambulatory Visit: Payer: Self-pay

## 2025-01-27 VITALS — BP 128/87 | HR 94 | Temp 96.5°F | Resp 18 | Wt 119.8 lb

## 2025-01-27 VITALS — BP 110/80 | HR 96 | Temp 98.2°F | Resp 18

## 2025-01-27 DIAGNOSIS — Z720 Tobacco use: Secondary | ICD-10-CM

## 2025-01-27 DIAGNOSIS — T451X5A Adverse effect of antineoplastic and immunosuppressive drugs, initial encounter: Secondary | ICD-10-CM | POA: Diagnosis not present

## 2025-01-27 DIAGNOSIS — J441 Chronic obstructive pulmonary disease with (acute) exacerbation: Secondary | ICD-10-CM | POA: Diagnosis not present

## 2025-01-27 DIAGNOSIS — C20 Malignant neoplasm of rectum: Secondary | ICD-10-CM

## 2025-01-27 DIAGNOSIS — Z5111 Encounter for antineoplastic chemotherapy: Secondary | ICD-10-CM

## 2025-01-27 DIAGNOSIS — G620 Drug-induced polyneuropathy: Secondary | ICD-10-CM

## 2025-01-27 LAB — CMP (CANCER CENTER ONLY)
ALT: 11 U/L (ref 0–44)
AST: 22 U/L (ref 15–41)
Albumin: 4 g/dL (ref 3.5–5.0)
Alkaline Phosphatase: 99 U/L (ref 38–126)
Anion gap: 13 (ref 5–15)
BUN: 15 mg/dL (ref 6–20)
CO2: 25 mmol/L (ref 22–32)
Calcium: 9.6 mg/dL (ref 8.9–10.3)
Chloride: 96 mmol/L — ABNORMAL LOW (ref 98–111)
Creatinine: 0.92 mg/dL (ref 0.44–1.00)
GFR, Estimated: >60 mL/min
Glucose, Bld: 154 mg/dL — ABNORMAL HIGH (ref 70–99)
Potassium: 3.9 mmol/L (ref 3.5–5.1)
Sodium: 134 mmol/L — ABNORMAL LOW (ref 135–145)
Total Bilirubin: 0.2 mg/dL (ref 0.0–1.2)
Total Protein: 6.6 g/dL (ref 6.5–8.1)

## 2025-01-27 LAB — CBC WITH DIFFERENTIAL (CANCER CENTER ONLY)
Abs Immature Granulocytes: 0.04 10*3/uL (ref 0.00–0.07)
Basophils Absolute: 0.1 10*3/uL (ref 0.0–0.1)
Basophils Relative: 1 %
Eosinophils Absolute: 0.1 10*3/uL (ref 0.0–0.5)
Eosinophils Relative: 1 %
HCT: 42.5 % (ref 36.0–46.0)
Hemoglobin: 14.1 g/dL (ref 12.0–15.0)
Immature Granulocytes: 1 %
Lymphocytes Relative: 10 %
Lymphs Abs: 0.8 10*3/uL (ref 0.7–4.0)
MCH: 32.7 pg (ref 26.0–34.0)
MCHC: 33.2 g/dL (ref 30.0–36.0)
MCV: 98.6 fL (ref 80.0–100.0)
Monocytes Absolute: 0.7 10*3/uL (ref 0.1–1.0)
Monocytes Relative: 8 %
Neutro Abs: 6.4 10*3/uL (ref 1.7–7.7)
Neutrophils Relative %: 79 %
Platelet Count: 139 10*3/uL — ABNORMAL LOW (ref 150–400)
RBC: 4.31 MIL/uL (ref 3.87–5.11)
RDW: 14.7 % (ref 11.5–15.5)
WBC Count: 8.1 10*3/uL (ref 4.0–10.5)
nRBC: 0 % (ref 0.0–0.2)

## 2025-01-27 LAB — GENETIC SCREENING ORDER

## 2025-01-27 LAB — MAGNESIUM: Magnesium: 1.9 mg/dL (ref 1.7–2.4)

## 2025-01-27 MED ORDER — ATROPINE SULFATE 1 MG/ML IV SOLN
0.5000 mg | Freq: Once | INTRAVENOUS | Status: AC
  Administered 2025-01-27: 0.5 mg via INTRAVENOUS
  Filled 2025-01-27: qty 1

## 2025-01-27 MED ORDER — PALONOSETRON HCL INJECTION 0.25 MG/5ML
0.2500 mg | Freq: Once | INTRAVENOUS | Status: AC
  Administered 2025-01-27: 0.25 mg via INTRAVENOUS
  Filled 2025-01-27: qty 5

## 2025-01-27 MED ORDER — SODIUM CHLORIDE 0.9 % IV SOLN
INTRAVENOUS | Status: DC
  Filled 2025-01-27: qty 250

## 2025-01-27 MED ORDER — SODIUM CHLORIDE 0.9 % IV SOLN
6.0000 mg/kg | Freq: Once | INTRAVENOUS | Status: AC
  Administered 2025-01-27: 300 mg via INTRAVENOUS
  Filled 2025-01-27: qty 15

## 2025-01-27 MED ORDER — SODIUM CHLORIDE 0.9 % IV SOLN
180.0000 mg/m2 | Freq: Once | INTRAVENOUS | Status: AC
  Administered 2025-01-27: 260 mg via INTRAVENOUS
  Filled 2025-01-27: qty 13

## 2025-01-27 MED ORDER — DEXAMETHASONE SOD PHOSPHATE PF 10 MG/ML IJ SOLN
10.0000 mg | Freq: Once | INTRAMUSCULAR | Status: AC
  Administered 2025-01-27: 10 mg via INTRAVENOUS
  Filled 2025-01-27: qty 1

## 2025-01-27 NOTE — Assessment & Plan Note (Signed)
 Grade 2  Continue gabapentin 300mg  BID.

## 2025-01-27 NOTE — Patient Instructions (Signed)
 Device With a Small Disc Placed for Long-Term IV Use (Implanted Port): Care at Home An implanted port is a device with a small disc that's put under your skin. In most cases, it's placed in your chest. It can be used to draw blood and send medicines and fluids quickly into your bloodstream. You may need a port to give you: IV medicines that would bother the small veins in your hands or arms. IV medicines for a long time, such as medicines to kill cancer cells (chemotherapy). IV liquid nutrition for a long time. An implanted port has 2 main parts: The portal. This is the round disc where the needle is put in. It may look like a small, raised area under your skin. The catheter. This is a soft tube that connects the port to a vein. You may be able to do more everyday tasks with a port than with other types of long-term IVs. How is my port accessed?  A numbing cream may be put on the skin over the port site. Your skin will be cleaned with a solution that kills germs. Your health care provider will gently pinch the port and put a needle in it. The port will be checked to make sure it's in the vein and working. If you need to get medicine all the time, the needle may be left in the port. Your provider will put a clear bandage over the needle site. The bandage and needle will need to be changed each week. What is flushing? Flushing helps keep the port working like it should. Flush your port as told. If your port is only used from time to time to give medicines or draw blood, you may need to flush it: Before and after medicines have been given. Before and after blood has been drawn. Every 4-6 weeks to keep it working well. If you get medicine through your port all the time, you may not need to flush it. Talk with your provider to learn more. How long will my port stay in? The port will stay in for as long as it's needed. When it's time for it to come out, you'll have a procedure to remove it. Follow  these instructions at home: Caring for your port and port site Flush your port as told. If you need to get an infusion of medicine over a few days, take care of your port as told. Make sure you: Take care of your port site. Wash your hands with soap and water  for at least 20 seconds before and after you change your bandage. If you can't use soap and water , use hand sanitizer. Put any used bandages or infusion bags in a plastic bag. Throw the bag in the trash. Keep the bandage that covers the needle clean and dry. Do not let it get wet. Do not use scissors or sharp objects near the tubes. Keep any tubes clamped, unless they're being used. Check the area around your port site every day for signs of infection. Check for: Redness, swelling, or pain. Fluid or blood. Warmth. Pus or a bad smell. Protect the skin around the port site. Avoid wearing bra straps that rub or irritate the site. Protect the skin around your port from seat belts. Place a soft pad over your chest if needed. Do not take baths, swim, or use a hot tub until you're told it's OK. Ask if you can shower. General instructions  Throw away any syringes in a container that's meant for sharp  items (sharps container). You can buy a sharps container from a pharmacy. You can also make one by using an empty, hard plastic bottle with a lid. Always carry a medical alert card or wear a medical alert bracelet. Ask what things are safe for you to do at home. Ask when you can go back to work or school. Where to find more information To learn more, go to: American Cancer Society at prombar.it. Click on the magnifying glass and type IV lines and ports. Find the link you need. American Society of Clinical Oncology at fabvets.de. Click on the magnifying glass and type getting chemo infusions. Find the link you need. Contact a health care provider if: You can't flush your port. You can't draw blood from the port. You have a fever or  chills. You have any signs of infection. You have swelling in your arm, neck, or shoulder. This information is not intended to replace advice given to you by your health care provider. Make sure you discuss any questions you have with your health care provider. Document Revised: 09/23/2024 Document Reviewed: 09/23/2024 Elsevier Patient Education  2025 [employer].

## 2025-01-27 NOTE — Assessment & Plan Note (Signed)
 Chemotherapy plan as listed above

## 2025-01-27 NOTE — Assessment & Plan Note (Signed)
 Recommend smoke cessation. .  Nicotin patch Rx was sent

## 2025-01-27 NOTE — Assessment & Plan Note (Addendum)
 History of Stage III. pT3 pN1b cM0, s/p APR. 05/2023 Stage IV current rectal adenocarcinoma Currently on adjuvant chemotherapy. S/p FOLFOX x 4 cycles S/p concurrent chemotherapy [Xeloda  1300 mg BID] with RT, and then another 4 cycles of adjuvant FOLFOX [dose reduced oxaliplatin  and omit 5-FU bolus 05/2023 CT imaging indicates disease progression. liver biopsy pathology showed metastatic carcinoma--> 06/2023 FOLFIRI + Bevacizumab --> 10/2023 and 02/2024 CT partial response --> 05/2024 CT stable - Shared decision was made to hold off irinotecan  given stable disease--> 08/2024 CT stable.  NGS showed TP53 missense variant, APC frameshift, TCF7L2 Frameshift, FLT3 copy number gain., TMB 4.7, pMMR Wildtype KRAS/NRAS  Labs are reviewed and discussed with patient. Recent Signatera circulating tumor DNA is positive.  11/2024  CT scan showed disease progression.  We proceeded with next line treatment with irinotecan  and panitumumab .

## 2025-01-27 NOTE — Assessment & Plan Note (Signed)
 Recommend patient to use her inhalers and stop smoking Improved.

## 2025-02-03 ENCOUNTER — Inpatient Hospital Stay: Admitting: Nurse Practitioner

## 2025-02-03 ENCOUNTER — Inpatient Hospital Stay

## 2025-02-10 ENCOUNTER — Inpatient Hospital Stay

## 2025-02-10 ENCOUNTER — Inpatient Hospital Stay: Admitting: Oncology

## 2025-03-13 ENCOUNTER — Inpatient Hospital Stay: Admitting: Hospice and Palliative Medicine

## 2025-09-10 ENCOUNTER — Ambulatory Visit
